# Patient Record
Sex: Male | Born: 1970 | Race: Black or African American | Hispanic: No | Marital: Single | Smoking: Former smoker
Health system: Southern US, Community
[De-identification: ages and names within clinical notes are randomized; demographics above are authoritative.]

## PROBLEM LIST (undated history)

## (undated) DIAGNOSIS — N261 Atrophy of kidney (terminal): Secondary | ICD-10-CM

## (undated) DIAGNOSIS — Z9689 Presence of other specified functional implants: Secondary | ICD-10-CM

## (undated) DIAGNOSIS — K859 Acute pancreatitis without necrosis or infection, unspecified: Secondary | ICD-10-CM

## (undated) DIAGNOSIS — Z87442 Personal history of urinary calculi: Secondary | ICD-10-CM

## (undated) DIAGNOSIS — E119 Type 2 diabetes mellitus without complications: Secondary | ICD-10-CM

## (undated) DIAGNOSIS — M545 Low back pain, unspecified: Secondary | ICD-10-CM

## (undated) DIAGNOSIS — K861 Other chronic pancreatitis: Secondary | ICD-10-CM

## (undated) DIAGNOSIS — E7800 Pure hypercholesterolemia, unspecified: Secondary | ICD-10-CM

## (undated) DIAGNOSIS — G43909 Migraine, unspecified, not intractable, without status migrainosus: Secondary | ICD-10-CM

## (undated) DIAGNOSIS — R519 Headache, unspecified: Secondary | ICD-10-CM

## (undated) DIAGNOSIS — I10 Essential (primary) hypertension: Secondary | ICD-10-CM

## (undated) DIAGNOSIS — K219 Gastro-esophageal reflux disease without esophagitis: Secondary | ICD-10-CM

## (undated) DIAGNOSIS — J189 Pneumonia, unspecified organism: Secondary | ICD-10-CM

## (undated) DIAGNOSIS — N200 Calculus of kidney: Secondary | ICD-10-CM

## (undated) DIAGNOSIS — N289 Disorder of kidney and ureter, unspecified: Secondary | ICD-10-CM

## (undated) DIAGNOSIS — R51 Headache: Secondary | ICD-10-CM

## (undated) DIAGNOSIS — G8929 Other chronic pain: Secondary | ICD-10-CM

## (undated) DIAGNOSIS — Q600 Renal agenesis, unilateral: Secondary | ICD-10-CM

## (undated) DIAGNOSIS — J42 Unspecified chronic bronchitis: Secondary | ICD-10-CM

## (undated) HISTORY — DX: Type 2 diabetes mellitus without complications: E11.9

## (undated) HISTORY — DX: Acute pancreatitis without necrosis or infection, unspecified: K85.90

## (undated) HISTORY — PX: NO PAST SURGERIES: SHX2092

---

## 1998-05-12 ENCOUNTER — Emergency Department (HOSPITAL_COMMUNITY): Admission: EM | Admit: 1998-05-12 | Discharge: 1998-05-12 | Payer: Self-pay | Admitting: Emergency Medicine

## 1999-03-14 ENCOUNTER — Encounter: Payer: Self-pay | Admitting: Emergency Medicine

## 1999-03-14 ENCOUNTER — Emergency Department (HOSPITAL_COMMUNITY): Admission: EM | Admit: 1999-03-14 | Discharge: 1999-03-14 | Payer: Self-pay | Admitting: Emergency Medicine

## 1999-06-20 ENCOUNTER — Emergency Department (HOSPITAL_COMMUNITY): Admission: EM | Admit: 1999-06-20 | Discharge: 1999-06-20 | Payer: Self-pay | Admitting: Emergency Medicine

## 1999-10-09 ENCOUNTER — Emergency Department (HOSPITAL_COMMUNITY): Admission: EM | Admit: 1999-10-09 | Discharge: 1999-10-09 | Payer: Self-pay | Admitting: Emergency Medicine

## 2000-02-07 ENCOUNTER — Encounter: Payer: Self-pay | Admitting: Emergency Medicine

## 2000-02-07 ENCOUNTER — Emergency Department (HOSPITAL_COMMUNITY): Admission: EM | Admit: 2000-02-07 | Discharge: 2000-02-07 | Payer: Self-pay | Admitting: *Deleted

## 2001-05-11 ENCOUNTER — Encounter: Payer: Self-pay | Admitting: Emergency Medicine

## 2001-05-11 ENCOUNTER — Emergency Department (HOSPITAL_COMMUNITY): Admission: EM | Admit: 2001-05-11 | Discharge: 2001-05-11 | Payer: Self-pay | Admitting: Emergency Medicine

## 2001-11-11 ENCOUNTER — Encounter: Payer: Self-pay | Admitting: Emergency Medicine

## 2001-11-11 ENCOUNTER — Emergency Department (HOSPITAL_COMMUNITY): Admission: EM | Admit: 2001-11-11 | Discharge: 2001-11-11 | Payer: Self-pay | Admitting: Emergency Medicine

## 2002-04-02 ENCOUNTER — Emergency Department (HOSPITAL_COMMUNITY): Admission: EM | Admit: 2002-04-02 | Discharge: 2002-04-02 | Payer: Self-pay | Admitting: Emergency Medicine

## 2002-04-02 ENCOUNTER — Encounter: Payer: Self-pay | Admitting: Emergency Medicine

## 2002-12-23 ENCOUNTER — Emergency Department (HOSPITAL_COMMUNITY): Admission: EM | Admit: 2002-12-23 | Discharge: 2002-12-23 | Payer: Self-pay | Admitting: Emergency Medicine

## 2003-08-14 ENCOUNTER — Emergency Department (HOSPITAL_COMMUNITY): Admission: EM | Admit: 2003-08-14 | Discharge: 2003-08-15 | Payer: Self-pay | Admitting: Family Medicine

## 2004-10-18 ENCOUNTER — Emergency Department (HOSPITAL_COMMUNITY): Admission: EM | Admit: 2004-10-18 | Discharge: 2004-10-18 | Payer: Self-pay | Admitting: Emergency Medicine

## 2004-12-30 ENCOUNTER — Emergency Department (HOSPITAL_COMMUNITY): Admission: EM | Admit: 2004-12-30 | Discharge: 2004-12-31 | Payer: Self-pay | Admitting: Emergency Medicine

## 2011-03-09 ENCOUNTER — Emergency Department (HOSPITAL_COMMUNITY)
Admission: EM | Admit: 2011-03-09 | Discharge: 2011-03-09 | Disposition: A | Discharge status: Home / Self Care | Payer: Self-pay | Attending: Emergency Medicine | Admitting: Emergency Medicine

## 2011-03-09 ENCOUNTER — Encounter: Payer: Self-pay | Admitting: *Deleted

## 2011-03-09 DIAGNOSIS — X58XXXA Exposure to other specified factors, initial encounter: Secondary | ICD-10-CM | POA: Insufficient documentation

## 2011-03-09 DIAGNOSIS — S161XXA Strain of muscle, fascia and tendon at neck level, initial encounter: Secondary | ICD-10-CM

## 2011-03-09 DIAGNOSIS — S139XXA Sprain of joints and ligaments of unspecified parts of neck, initial encounter: Secondary | ICD-10-CM | POA: Insufficient documentation

## 2011-03-09 DIAGNOSIS — M62838 Other muscle spasm: Secondary | ICD-10-CM | POA: Insufficient documentation

## 2011-03-09 MED ORDER — IBUPROFEN 800 MG PO TABS: 800.0000 mg | ORAL_TABLET | Freq: Three times a day (TID) | ORAL | Status: AC

## 2011-03-09 MED ORDER — ACETAMINOPHEN-CODEINE #3 300-30 MG PO TABS
1.0000 | ORAL_TABLET | Freq: Once | ORAL | Status: AC
  Administered 2011-03-09: 1 via ORAL
  Filled 2011-03-09: qty 1

## 2011-03-09 MED ORDER — DIAZEPAM 5 MG PO TABS
5.0000 mg | ORAL_TABLET | Freq: Once | ORAL | Status: AC
  Administered 2011-03-09: 5 mg via ORAL
  Filled 2011-03-09: qty 1

## 2011-03-09 MED ORDER — ACETAMINOPHEN-CODEINE #3 300-30 MG PO TABS: 1.0000 | ORAL_TABLET | Freq: Four times a day (QID) | ORAL | Status: AC | PRN

## 2011-03-09 MED ORDER — DIAZEPAM 5 MG PO TABS: ORAL_TABLET | ORAL | Status: AC

## 2011-03-09 MED ORDER — IBUPROFEN 800 MG PO TABS
800.0000 mg | ORAL_TABLET | Freq: Once | ORAL | Status: AC
  Administered 2011-03-09: 800 mg via ORAL
  Filled 2011-03-09: qty 1

## 2011-03-09 NOTE — ED Provider Notes (Signed)
History     CSN: [id number] Arrival date & time: 03/09/2011  1:41 PM   First MD Initiated Contact with Patient 03/09/11 1440      Chief Complaint  Patient presents with  . Neck Pain    "no feeling in LT hand/LFA"    (Consider location/radiation/quality/duration/timing/severity/associated sxs/prior treatment) HPI  Patient presents to emergency department complaining of waking 5 days ago with left-sided neck stiffness that he states has gradually increased over the last 5 days with pain aggravated by movement and that improves with keeping head still. Patient states he took Tylenol and ibuprofen over the last few days without relief of pain and therefore stopped taking those yesterday. Patient denies known injury to neck. Patient states that his left hand has had "pins and needles-like feeling" intermittently over the last 5 days but denies extremity weakness or numbness. Patient denies fevers, chills, headache, dizziness, rash. Patient has no known medical problems and takes no medicine on a basis. Symptoms were gradual onset, persistent, and worsening.  History reviewed. No pertinent past medical history.  History reviewed. No pertinent past surgical history.  History reviewed. No pertinent family history.  History  Substance Use Topics  . Smoking status: Never Smoker   . Smokeless tobacco: Not on file  . Alcohol Use:      40 oz a day of beer      Review of Systems  All other systems reviewed and are negative.    Allergies  Review of patient's allergies indicates no known allergies.  Home Medications  No current outpatient prescriptions on file.  BP 121/89  Pulse 92  Temp(Src) 98.9 F (37.2 C) (Oral)  Resp 20  Ht 6\' 1"  (1.854 m)  Wt 185 lb (83.915 kg)  BMI 24.41 kg/m2  SpO2 97%  Physical Exam  Nursing note and vitals reviewed. Constitutional: He is oriented to person, place, and time. He appears well-developed and well-nourished.  HENT:  Head:  Normocephalic and atraumatic.  Eyes: Conjunctivae are normal.  Neck: Neck supple. No tracheal deviation present.       Soft tissue tenderness of left lower lateral neck with muscle spasticity but no crepitus or skin changes.  Cardiovascular: Normal rate, regular rhythm and normal heart sounds.   Pulmonary/Chest: Effort normal.  Abdominal: Soft. Bowel sounds are normal. He exhibits no distension and no mass. There is no tenderness. There is no rebound and no guarding.  Genitourinary:       Penile discharge. No testicular tenderness to palpation. Testes descended bilaterally.  Musculoskeletal: Normal range of motion. He exhibits tenderness.       Full range of motion of bilateral upper extremities with 5 out of 5 strength and normal deep tendon reflexes. Tenderness to palpation of left upper shoulder and with muscle spasticity but no skin changes or crepitus.  Lymphadenopathy:    He has no cervical adenopathy.       Right: No inguinal adenopathy present.       Left: No inguinal adenopathy present.  Neurological: He is alert and oriented to person, place, and time. He has normal reflexes.  Skin: Skin is warm and dry. No rash noted.  Psychiatric: He has a normal mood and affect.    ED Course  Procedures (including critical care time)  Labs Reviewed - No data to display No results found.   1. Cervical strain   2. Muscle spasm       MDM  Muscle spasticity of left lower lateral neck and shoulder without signs  or symptoms of central cord compression or cauda equina. No meningeal signs. Afebrile nontoxic-appearing.        Jenness Corner, [place] 03/09/11 1454

## 2011-03-09 NOTE — ED Notes (Signed)
States he woke up Monday with neck pain which continued to 10-Sep-2022 when his "hand went dead".  Denies pins/needles/tingling.  Denies injury.

## 2011-03-09 NOTE — ED Notes (Signed)
Pt states he started to have left side neck pain. Pt states pain is now in his shoulders. Pt denies headache, chest pain, or blurred vision. Pt is a&ox3

## 2011-03-09 NOTE — ED Provider Notes (Signed)
Evaluation and management procedures were performed by the mid-level provider (PA/NP/CNM) under my supervision/collaboration. I was present and available during the ED course. Kyle Eastmond Y.   [name]. Shanzay Hepworth, MD 03/09/11 [phone]

## 2013-03-30 ENCOUNTER — Encounter (HOSPITAL_COMMUNITY): Payer: Self-pay | Admitting: Emergency Medicine

## 2013-03-30 ENCOUNTER — Emergency Department (HOSPITAL_COMMUNITY)
Admission: EM | Admit: 2013-03-30 | Discharge: 2013-03-30 | Disposition: A | Discharge status: Home / Self Care | Payer: Self-pay | Attending: Emergency Medicine | Admitting: Emergency Medicine

## 2013-03-30 DIAGNOSIS — N200 Calculus of kidney: Secondary | ICD-10-CM | POA: Insufficient documentation

## 2013-03-30 LAB — URINALYSIS, ROUTINE W REFLEX MICROSCOPIC
Bilirubin Urine: NEGATIVE
Glucose, UA: NEGATIVE mg/dL
Ketones, ur: NEGATIVE mg/dL
Nitrite: NEGATIVE
Protein, ur: NEGATIVE mg/dL
pH: 5 (ref 5.0–8.0)

## 2013-03-30 LAB — URINE MICROSCOPIC-ADD ON

## 2013-03-30 NOTE — ED Provider Notes (Signed)
Medical screening examination/treatment/procedure(s) were performed by non-physician practitioner and as supervising physician I was immediately available for consultation/collaboration.  EKG Interpretation   None         William Gabriele Zwilling, MD 03/30/13 2307 

## 2013-03-30 NOTE — ED Notes (Signed)
Pt states thinks he may have passed kidney stones Sunday morning; c/o left flank pain with actual passing of possible stones; states couldn't urinate then passed stones and pain stopped; states no symptoms now just wants to be checked

## 2013-03-30 NOTE — ED Provider Notes (Signed)
CSN: [id number]     Arrival date & time 03/30/13  1256 History   First MD Initiated Contact with Patient 03/30/13 1550     Chief Complaint  Patient presents with  . Flank Pain   (Consider location/radiation/quality/duration/timing/severity/associated sxs/prior Treatment) HPI Comments: Patient is a 42 year old healthy male who presents to the emergency department with concerns of possibly passing a kidney stone. Patient states around 5:00 this morning he woke up to go to the bathroom, developed a severe pain in the left side of his back radiating around to his flank, went to urinate, when he started he developed severe pain in his urine stream stopped. He was urinating into the sink and noticed small hard stones in the sink after he was done. Symptoms began to resolve shortly after and have not returned. At the time he was sweating. No nausea or vomiting, fever or chills. He has urinated since and it was normal denies dysuria, increased urinary, urgency or hematuria. No history of kidney stones.  Patient is a 42 y.o. male presenting with flank pain. The history is provided by the patient.  Flank Pain    History reviewed. No pertinent past medical history. History reviewed. No pertinent past surgical history. No family history on file. History  Substance Use Topics  . Smoking status: Never Smoker   . Smokeless tobacco: Not on file  . Alcohol Use:      Comment: 40 oz a day of beer    Review of Systems  Genitourinary: Positive for flank pain.  All other systems reviewed and are negative.    Allergies  Review of patient's allergies indicates no known allergies.  Home Medications  No current outpatient prescriptions on file. BP 151/95  Pulse 78  Temp(Src) 98.4 F (36.9 C) (Oral)  Resp 19  SpO2 95% Physical Exam  Nursing note and vitals reviewed. Constitutional: He is oriented to person, place, and time. He appears well-developed and well-nourished. No distress.  HENT:  Head:  Normocephalic and atraumatic.  Mouth/Throat: Oropharynx is clear and moist.  Eyes: Conjunctivae are normal.  Neck: Normal range of motion. Neck supple.  Cardiovascular: Normal rate, regular rhythm and normal heart sounds.   Pulmonary/Chest: Effort normal and breath sounds normal.  Abdominal: Soft. Normal appearance and bowel sounds are normal. He exhibits no distension. There is no tenderness. There is no rigidity, no rebound, no guarding and no CVA tenderness.  Musculoskeletal: Normal range of motion. He exhibits no edema.  Neurological: He is alert and oriented to person, place, and time.  Skin: Skin is warm and dry. He is not diaphoretic.  Psychiatric: He has a normal mood and affect. His behavior is normal.    ED Course  Procedures (including critical care time) Labs Review Labs Reviewed  URINALYSIS, ROUTINE W REFLEX MICROSCOPIC - Abnormal; Notable for the following:    Hgb urine dipstick TRACE (*)    All other components within normal limits  URINE MICROSCOPIC-ADD ON - Abnormal; Notable for the following:    Bacteria, UA FEW (*)    All other components within normal limits   Imaging Review No results found.  EKG Interpretation   None       MDM   1. Kidney stones    Pt presenting with symptoms of kidney stones, passed 5-6 small stones earlier this morning, brought them in a paper towel, largest ~3 mm. He is currently asymptomatic. UA obtained in triage prior to pt being seen, trace hemoglobin, otherwise no associated infection. Urine strainer  given in event this happens again, f/u with urology. Return precautions given. Patient states understanding of treatment care plan and is agreeable.     Trevor Mace, PA-C 03/30/13 [phone]

## 2013-04-18 ENCOUNTER — Encounter (HOSPITAL_COMMUNITY): Payer: Self-pay | Admitting: Emergency Medicine

## 2013-04-18 ENCOUNTER — Emergency Department (HOSPITAL_COMMUNITY)
Admission: EM | Admit: 2013-04-18 | Discharge: 2013-04-19 | Disposition: A | Discharge status: Home / Self Care | Payer: Self-pay | Attending: Emergency Medicine | Admitting: Emergency Medicine

## 2013-04-18 DIAGNOSIS — R509 Fever, unspecified: Secondary | ICD-10-CM | POA: Insufficient documentation

## 2013-04-18 DIAGNOSIS — B349 Viral infection, unspecified: Secondary | ICD-10-CM

## 2013-04-18 DIAGNOSIS — Z79899 Other long term (current) drug therapy: Secondary | ICD-10-CM | POA: Insufficient documentation

## 2013-04-18 DIAGNOSIS — B9789 Other viral agents as the cause of diseases classified elsewhere: Secondary | ICD-10-CM | POA: Insufficient documentation

## 2013-04-18 DIAGNOSIS — IMO0001 Reserved for inherently not codable concepts without codable children: Secondary | ICD-10-CM | POA: Insufficient documentation

## 2013-04-18 DIAGNOSIS — Z87442 Personal history of urinary calculi: Secondary | ICD-10-CM | POA: Insufficient documentation

## 2013-04-18 DIAGNOSIS — R319 Hematuria, unspecified: Secondary | ICD-10-CM

## 2013-04-18 DIAGNOSIS — R51 Headache: Secondary | ICD-10-CM | POA: Insufficient documentation

## 2013-04-18 HISTORY — DX: Disorder of kidney and ureter, unspecified: N28.9

## 2013-04-18 LAB — CBC WITH DIFFERENTIAL/PLATELET
BASOS ABS: 0 10*3/uL (ref 0.0–0.1)
BASOS PCT: 0 % (ref 0–1)
EOS ABS: 0.1 10*3/uL (ref 0.0–0.7)
EOS PCT: 1 % (ref 0–5)
HEMATOCRIT: 42.9 % (ref 39.0–52.0)
HEMOGLOBIN: 14.8 g/dL (ref 13.0–17.0)
Lymphocytes Relative: 32 % (ref 12–46)
Lymphs Abs: 1.6 10*3/uL (ref 0.7–4.0)
MCH: 32 pg (ref 26.0–34.0)
MCHC: 34.5 g/dL (ref 30.0–36.0)
MCV: 92.9 fL (ref 78.0–100.0)
MONO ABS: 0.4 10*3/uL (ref 0.1–1.0)
MONOS PCT: 7 % (ref 3–12)
Neutro Abs: 3 10*3/uL (ref 1.7–7.7)
Neutrophils Relative %: 59 % (ref 43–77)
Platelets: 166 10*3/uL (ref 150–400)
RBC: 4.62 MIL/uL (ref 4.22–5.81)
RDW: 13.5 % (ref 11.5–15.5)
WBC: 5 10*3/uL (ref 4.0–10.5)

## 2013-04-18 LAB — URINALYSIS, ROUTINE W REFLEX MICROSCOPIC
BILIRUBIN URINE: NEGATIVE
GLUCOSE, UA: NEGATIVE mg/dL
HGB URINE DIPSTICK: NEGATIVE
KETONES UR: NEGATIVE mg/dL
LEUKOCYTES UA: NEGATIVE
Nitrite: NEGATIVE
PH: 6 (ref 5.0–8.0)
PROTEIN: NEGATIVE mg/dL
Specific Gravity, Urine: 1.002 — ABNORMAL LOW (ref 1.005–1.030)
Urobilinogen, UA: 0.2 mg/dL (ref 0.0–1.0)

## 2013-04-18 NOTE — ED Notes (Signed)
Pt states he saw a small trace of blood after urinating. He states he passed a kidney stones a few days ago and is in the process of setting a f/u appt. With urology.

## 2013-04-18 NOTE — ED Notes (Signed)
Patient reports that he is having fever and Ha last night. Today he is having hematuria. Has a recent history of kidney stones

## 2013-04-19 LAB — BASIC METABOLIC PANEL
BUN: 9 mg/dL (ref 6–23)
CALCIUM: 9.8 mg/dL (ref 8.4–10.5)
CHLORIDE: 99 meq/L (ref 96–112)
CO2: 27 mEq/L (ref 19–32)
CREATININE: 1.23 mg/dL (ref 0.50–1.35)
GFR calc non Af Amer: 70 mL/min — ABNORMAL LOW (ref >90)
GFR, EST AFRICAN AMERICAN: 82 mL/min — AB (ref >90)
Glucose, Bld: 101 mg/dL — ABNORMAL HIGH (ref 70–99)
Potassium: 4.6 mEq/L (ref 3.7–5.3)
Sodium: 139 mEq/L (ref 137–147)

## 2013-04-19 NOTE — ED Provider Notes (Signed)
CSN: [id number]     Arrival date & time 04/18/13  1945 History   First MD Initiated Contact with Patient 04/18/13 2300     Chief Complaint  Patient presents with  . Hematuria   (Consider location/radiation/quality/duration/timing/severity/associated sxs/prior Treatment) HPI 43 year old male presents emergency apartment with complaint of one day of fever, headache, bodyaches.  Patient took Advil PM, and Robitussin last night.  Patient reports diagnosed with kidney stone  2 weeks ago.  This morning, he noticed blood in his urine.  He denies any pain associated with hematuria.  He reports his stepson has similar illness with fever and body aches.  Today he is feeling overall better.  He was concerned about the blood in his urine and wanted to get checked out.  Past Medical History  Diagnosis Date  . Renal disorder    History reviewed. No pertinent past surgical history. History reviewed. No pertinent family history. History  Substance Use Topics  . Smoking status: Never Smoker   . Smokeless tobacco: Not on file  . Alcohol Use: Yes     Comment: 40 oz a day of beer    Review of Systems  See History of Present Illness; otherwise all other systems are reviewed and negative Allergies  Review of patient's allergies indicates no known allergies.  Home Medications   Current Outpatient Rx  Name  Route  Sig  Dispense  Refill  . Ibuprofen-Diphenhydramine HCl (ADVIL PM) 200-25 MG CAPS   Oral   Take 1 tablet by mouth at bedtime as needed (sleep).         Marland Kitchen omeprazole (PRILOSEC) 20 MG capsule   Oral   Take 20 mg by mouth daily as needed (heart burn).         . Pseudoeph-CPM-DM-APAP (ROBITUSSIN FLU PO)   Oral   Take 1 capsule by mouth every 4 (four) hours as needed (flu-like symtpoms).          BP 145/87  Pulse 106  Temp(Src) 99.3 F (37.4 C) (Oral)  Resp 18  Wt 189 lb (85.73 kg)  SpO2 100% Physical Exam  Nursing note and vitals reviewed. Constitutional: He is oriented to  person, place, and time. He appears well-developed and well-nourished. No distress.  HENT:  Head: Normocephalic and atraumatic.  Right Ear: External ear normal.  Left Ear: External ear normal.  Nose: Nose normal.  Mouth/Throat: Oropharynx is clear and moist.  Eyes: Conjunctivae and EOM are normal. Pupils are equal, round, and reactive to light.  Neck: Normal range of motion. Neck supple. No JVD present. No tracheal deviation present. No thyromegaly present.  Cardiovascular: Normal rate, regular rhythm, normal heart sounds and intact distal pulses.  Exam reveals no gallop and no friction rub.   No murmur heard. Pulmonary/Chest: Effort normal and breath sounds normal. No stridor. No respiratory distress. He has no wheezes. He has no rales. He exhibits no tenderness.  Abdominal: Soft. Bowel sounds are normal. He exhibits no distension and no mass. There is no tenderness. There is no rebound and no guarding.  Musculoskeletal: Normal range of motion. He exhibits no edema and no tenderness.  Lymphadenopathy:    He has no cervical adenopathy.  Neurological: He is alert and oriented to person, place, and time. He has normal reflexes. He exhibits normal muscle tone. Coordination normal.  Skin: Skin is warm and dry. No rash noted. No erythema. No pallor.  Psychiatric: He has a normal mood and affect. His behavior is normal. Judgment and thought content normal.  ED Course  Procedures (including critical care time) Labs Review Labs Reviewed  URINALYSIS, ROUTINE W REFLEX MICROSCOPIC - Abnormal; Notable for the following:    Specific Gravity, Urine 1.002 (*)    All other components within normal limits  BASIC METABOLIC PANEL - Abnormal; Notable for the following:    Glucose, Bld 101 (*)    GFR calc non Af Amer 70 (*)    GFR calc Af Amer 82 (*)    All other components within normal limits  CBC WITH DIFFERENTIAL   Imaging Review No results found.  EKG Interpretation   None       MDM    1. Viral infection   2. Hematuria    43 year old male with hematuria earlier today.  Urine here today is normal.  Kidney function and CBC also normal.  Suspect is a viral illness, he may have some trace hematuria, left over from previous kidney stone.  He has followup scheduled with urology.  He is safe for discharge home   Kalman Drape, MD 04/19/13 [phone]

## 2013-04-19 NOTE — Discharge Instructions (Signed)
Alternate Tylenol and Motrin every 4-6 hours as needed for body aches, or fever.  Continue with Mucinex or Robitussin for cough and congestion.  Follow up with urology as indicated on your prior visit.  Return to the emergency room for worsening condition or new concerning symptoms.   Hematuria, Adult Hematuria (blood in your urine) can be caused by a bladder infection (cystitis), kidney infection (pyelonephritis), prostate infection (prostatitis), or kidney stone. Infections will usually respond to antibiotics (medications which kill germs), and a kidney stone will usually pass through your urine without further treatment. If you were put on antibiotics, take all the medicine until gone. You may feel better in a few days, but take all of your medicine or the infection may not respond and become more difficult to treat. If antibiotics were not given, an infection did not cause the blood in the urine. A further work up to find out the reason may be needed. HOME CARE INSTRUCTIONS   Drink lots of fluid, 3 to 4 quarts a day. If you have been diagnosed with an infection, cranberry juice is especially recommended, in addition to large amounts of water.  Avoid caffeine, tea, and carbonated beverages, because they tend to irritate the bladder.  Avoid alcohol as it may irritate the prostate.  Only take over-the-counter or prescription medicines for pain, discomfort, or fever as directed by your caregiver.  If you have been diagnosed with a kidney stone follow your caregivers instructions regarding straining your urine to catch the stone. TO PREVENT FURTHER INFECTIONS:  Empty the bladder often. Avoid holding urine for long periods of time.  After a bowel movement, women should cleanse front to back. Use each tissue only once.  Empty the bladder before and after sexual intercourse if you are a male.  Return to your caregiver if you develop back pain, fever, nausea (feeling sick to your stomach),  vomiting, or your symptoms (problems) are not better in 3 days. Return sooner if you are getting worse. If you have been requested to return for further testing make sure to keep your appointments. If an infection is not the cause of blood in your urine, X-rays may be required. Your caregiver will discuss this with you. SEEK IMMEDIATE MEDICAL CARE IF:   You have a persistent fever over 102 F (38.9 C).  You develop severe vomiting and are unable to keep the medication down.  You develop severe back or abdominal pain despite taking your medications.  You begin passing a large amount of blood or clots in your urine.  You feel extremely weak or faint, or pass out. MAKE SURE YOU:   Understand these instructions.  Will watch your condition.  Will get help right away if you are not doing well or get worse. Document Released: 04/02/2005 Document Revised: 06/25/2011 Document Reviewed: 11/20/2007 [facility] Patient Information 2014 [place].  Viral Infections A viral infection can be caused by different types of viruses.Most viral infections are not serious and resolve on their own. However, some infections may cause severe symptoms and may lead to further complications. SYMPTOMS Viruses can frequently cause:  Minor sore throat.  Aches and pains.  Headaches.  Runny nose.  Different types of rashes.  Watery eyes.  Tiredness.  Cough.  Loss of appetite.  Gastrointestinal infections, resulting in nausea, vomiting, and diarrhea. These symptoms do not respond to antibiotics because the infection is not caused by bacteria. However, you might catch a bacterial infection following the viral infection. This is sometimes called  a "superinfection." Symptoms of such a bacterial infection may include:  Worsening sore throat with pus and difficulty swallowing.  Swollen neck glands.  Chills and a high or persistent fever.  Severe headache.  Tenderness over the  sinuses.  Persistent overall ill feeling (malaise), muscle aches, and tiredness (fatigue).  Persistent cough.  Yellow, green, or brown mucus production with coughing. HOME CARE INSTRUCTIONS   Only take over-the-counter or prescription medicines for pain, discomfort, diarrhea, or fever as directed by your caregiver.  Drink enough water and fluids to keep your urine clear or pale yellow. Sports drinks can provide valuable electrolytes, sugars, and hydration.  Get plenty of rest and maintain proper nutrition. Soups and broths with crackers or rice are fine. SEEK IMMEDIATE MEDICAL CARE IF:   You have severe headaches, shortness of breath, chest pain, neck pain, or an unusual rash.  You have uncontrolled vomiting, diarrhea, or you are unable to keep down fluids.  You or your child has an oral temperature above 102 F (38.9 C), not controlled by medicine.  Your baby is older than 3 months with a rectal temperature of 102 F (38.9 C) or higher.  Your baby is 57 months old or younger with a rectal temperature of 100.4 F (38 C) or higher. MAKE SURE YOU:   Understand these instructions.  Will watch your condition.  Will get help right away if you are not doing well or get worse. Document Released: 01/10/2005 Document Revised: 06/25/2011 Document Reviewed: 08/07/2010 [facility] Patient Information 2014 [place], [place].

## 2013-11-16 ENCOUNTER — Emergency Department (INDEPENDENT_AMBULATORY_CARE_PROVIDER_SITE_OTHER): Payer: No Typology Code available for payment source

## 2013-11-16 ENCOUNTER — Emergency Department (HOSPITAL_COMMUNITY)
Admission: EM | Admit: 2013-11-16 | Discharge: 2013-11-16 | Disposition: A | Discharge status: Home / Self Care | Payer: No Typology Code available for payment source | Source: Home / Self Care | Attending: Emergency Medicine | Admitting: Emergency Medicine

## 2013-11-16 ENCOUNTER — Encounter (HOSPITAL_COMMUNITY): Payer: Self-pay | Admitting: Emergency Medicine

## 2013-11-16 DIAGNOSIS — M533 Sacrococcygeal disorders, not elsewhere classified: Secondary | ICD-10-CM

## 2013-11-16 MED ORDER — HYDROCODONE-ACETAMINOPHEN 5-325 MG PO TABS: ORAL_TABLET | ORAL | Status: DC

## 2013-11-16 MED ORDER — KETOROLAC TROMETHAMINE 60 MG/2ML IM SOLN
INTRAMUSCULAR | Status: AC
  Filled 2013-11-16: qty 2

## 2013-11-16 MED ORDER — PREDNISONE 20 MG PO TABS: ORAL_TABLET | ORAL | Status: DC

## 2013-11-16 MED ORDER — HYDROCODONE-ACETAMINOPHEN 5-325 MG PO TABS
2.0000 | ORAL_TABLET | Freq: Once | ORAL | Status: AC
  Administered 2013-11-16: 2 via ORAL

## 2013-11-16 MED ORDER — KETOROLAC TROMETHAMINE 60 MG/2ML IM SOLN
60.0000 mg | Freq: Once | INTRAMUSCULAR | Status: AC
  Administered 2013-11-16: 60 mg via INTRAMUSCULAR

## 2013-11-16 MED ORDER — HYDROCODONE-ACETAMINOPHEN 5-325 MG PO TABS
ORAL_TABLET | ORAL | Status: AC
  Filled 2013-11-16: qty 2

## 2013-11-16 MED ORDER — DICLOFENAC SODIUM 75 MG PO TBEC: 75.0000 mg | DELAYED_RELEASE_TABLET | Freq: Two times a day (BID) | ORAL | Status: DC
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
## 2013-11-16 NOTE — Discharge Instructions (Signed)
Do exercises twice daily followed by moist heat for 15 minutes. ° ° ° ° ° °Try to be as active as possible. ° °If no better in 2 weeks, follow up with orthopedist. ° ° °

## 2013-11-16 NOTE — ED Provider Notes (Signed)
Chief Complaint   Chief Complaint  Patient presents with  . Back Pain    History of Present Illness   Kyle Berry is a 43 year old male who has had a several year history of recurring lower back pain. This occurred after he fell on the job. The pain flared up about 3 or 4 days ago without any obvious precipitating factor. The pain is rated 10 over 10 at its worst and now is an 8/10. It's worse if he bends or twists. Sometimes it feels like his back gives way. It is sometimes sharp and sometimes dull. It's better when he lies flat or rests, and also when he walks. The pain radiates into his bilateral proximal thighs and he has some paresthesias in that area and his legs feel weak. He denies any bladder or bowel dysfunction or saddle anesthesia. He's had no bowel pain, fever, chills, or weight loss.  Review of Systems   Other than as noted above, the patient denies any of the following symptoms: Systemic:  No fever, chills, or unexplained weight loss. GI:  No abdominal painor incontinence of bowel. GU:  No dysuria, frequency, urgency, or hematuria. No incontinence of urine or urinary retention.  M-S:  No neck pain or arthritis. Neuro:  No paresthesias, headache, saddle anesthesia, muscular weakness, or progressive neurological deficit.  [place]   Past medical history, family history, social history, meds, and allergies were reviewed. Specifically, there is no history of cancer, major trauma, osteoporosis, immunosuppression, or HIV infection. He has a history of kidney stones.  Physical Examination    Vital signs:  BP 141/96  Pulse 82  Temp(Src) 98.9 F (37.2 C) (Oral)  Resp 16  SpO2 95% General:  Alert, oriented, in no distress. Abdomen:  Soft, non-tender.  No organomegaly or mass.  No pulsatile midline abdominal mass or bruit. Back:  He has is to palpation over the paraspinous muscles bilaterally and also over the sacroiliac joints but none at the midline. The back has a full  range of motion but with pain on movement. Straight leg raising is negative. Neuro:  Normal muscle strength, sensations and DTRs. Extremities: Pedal pulses were full, there was no edema. Skin:  Clear, warm and dry.  No rash.   Radiology   Dg Lumbar Spine Complete  11/16/2013   CLINICAL DATA:  Chronic lower back pain has become worse over the last couple weeks. No injury.  EXAM: LUMBAR SPINE - COMPLETE 4+ VIEW  COMPARISON:  None.  FINDINGS: Straightening of the lumbar spine with minimal curvature convex the left.  No significant disc space narrowing.  No pars defect.  Mild right-sided sacroiliac joint degenerative changes.  IMPRESSION: Straightening of the lumbar spine with minimal curvature convex the left.  No significant disc space narrowing.  No pars defect.  Mild right-sided sacroiliac joint degenerative changes.   Electronically Signed   By: Chauncey Cruel M.D.   On: 11/16/2013 13:21    Course in Urgent [place]   He was given Toradol 60 mg IM and Norco 5/325 2 by mouth for pain with good relief of his pain.    Assessment   The encounter diagnosis was Sacro ilial pain.  No evidence of cauda equina syndrome, discitis, epidural abscess, or aneurism.    Plan     1.  Meds:  The following meds were prescribed:   Discharge Medication List as of 11/16/2013  2:26 PM    START taking these medications   Details  diclofenac (VOLTAREN) 75  MG EC tablet Take 1 tablet (75 mg total) by mouth 2 (two) times daily., Starting 11/16/2013, Until Discontinued, Normal    HYDROcodone-acetaminophen (NORCO/VICODIN) 5-325 MG per tablet 1 to 2 tabs every 4 to 6 hours as needed for pain., Print    predniSONE (DELTASONE) 20 MG tablet Take 3 daily for 5 days, 2 daily for 5 days, 1 daily for 5 days., Normal        2.  Patient Education/Counseling:  The patient was given appropriate handouts, self care instructions, and instructed in symptomatic relief. The patient was encouraged to try to be as active as  possible and given some exercises to do followed by moist heat.  3.  Follow up:  The patient was told to follow up here if no better in 3 to 4 days, or sooner if becoming worse in any way, and given some red flag symptoms such as worsening pain or new neurological symptoms which would prompt immediate return.  Follow up with Dr. Fredonia Highland in 2 weeks.     Harden Mo, MD 11/16/13 [phone]

## 2013-11-18 ENCOUNTER — Emergency Department (INDEPENDENT_AMBULATORY_CARE_PROVIDER_SITE_OTHER)
Admission: EM | Admit: 2013-11-18 | Discharge: 2013-11-18 | Disposition: A | Discharge status: Home / Self Care | Payer: No Typology Code available for payment source | Source: Home / Self Care | Attending: Family Medicine | Admitting: Family Medicine

## 2013-11-18 ENCOUNTER — Encounter (HOSPITAL_COMMUNITY): Payer: Self-pay | Admitting: Emergency Medicine

## 2013-11-18 DIAGNOSIS — M545 Low back pain, unspecified: Secondary | ICD-10-CM

## 2013-11-18 MED ORDER — TRAMADOL HCL 50 MG PO TABS: 50.0000 mg | ORAL_TABLET | Freq: Three times a day (TID) | ORAL | Status: DC | PRN

## 2013-11-18 NOTE — ED Provider Notes (Signed)
CSN: [id number]     Arrival date & time 11/18/13  5053 History   None    Chief Complaint  Patient presents with  . Allergic Reaction   (Consider location/radiation/quality/duration/timing/severity/associated sxs/prior Treatment) HPI Comments: Patient reports he was evaluated at [facility] for chronic lower back pain on 11/16/2013 and prescribed prednisone taper and Diclofenac for pain. He feels he has experienced an allergic response to Diclofenac. States when he took his prescribed doses yesterday, he experienced generalized itching without visible rash and swelling of his lips with associated difficulty swallowing. States these symptoms have since resolved, but he has been hesitant to take any additional doses. States he has taken prednisone without difficulty. Also requests note for employer excusing him from work through 11/21/2013.   The history is provided by the patient.    Past Medical History  Diagnosis Date  . Renal disorder    History reviewed. No pertinent past surgical history. History reviewed. No pertinent family history. History  Substance Use Topics  . Smoking status: Never Smoker   . Smokeless tobacco: Not on file  . Alcohol Use: Yes     Comment: 40 oz a day of beer    Review of Systems  All other systems reviewed and are negative.   Allergies  Review of patient's allergies indicates no known allergies.  Home Medications   Prior to Admission medications   Medication Sig Start Date End Date Taking? Authorizing Provider  diclofenac (VOLTAREN) 75 MG EC tablet Take 1 tablet (75 mg total) by mouth 2 (two) times daily. 11/16/13   Harden Mo, MD  HYDROcodone-acetaminophen (NORCO/VICODIN) 5-325 MG per tablet 1 to 2 tabs every 4 to 6 hours as needed for pain. 11/16/13   Harden Mo, MD  Ibuprofen-Diphenhydramine HCl (ADVIL PM) 200-25 MG CAPS Take 1 tablet by mouth at bedtime as needed (sleep).    Historical Provider, MD  omeprazole (PRILOSEC) 20 MG capsule Take 20 mg by mouth  daily as needed (heart burn).    Historical Provider, MD  predniSONE (DELTASONE) 20 MG tablet Take 3 daily for 5 days, 2 daily for 5 days, 1 daily for 5 days. 11/16/13   Harden Mo, MD  Pseudoeph-CPM-DM-APAP (ROBITUSSIN FLU PO) Take 1 capsule by mouth every 4 (four) hours as needed (flu-like symtpoms).    Historical Provider, MD  traMADol (ULTRAM) 50 MG tablet Take 1 tablet (50 mg total) by mouth 3 (three) times daily as needed for moderate pain or severe pain. 11/18/13   Annett Gula H Sammantha Mehlhaff, PA   BP 134/81  Pulse 87  Temp(Src) 98.5 F (36.9 C) (Oral)  Resp 18  SpO2 96% Physical Exam  Nursing note and vitals reviewed. Constitutional: He is oriented to person, place, and time. He appears well-developed and well-nourished.  HENT:  Head: Normocephalic and atraumatic.  Eyes: Conjunctivae are normal. No scleral icterus.  Neck: Normal range of motion. Neck supple.  Cardiovascular: Normal rate, regular rhythm and normal heart sounds.   Pulmonary/Chest: Effort normal and breath sounds normal.  Abdominal: Soft. Bowel sounds are normal. He exhibits no distension. There is no tenderness.  Musculoskeletal:       Back:  Outlined area is area of reported chronic pain, however, little to no pain was reproducible during today's examination. Neurovascular and strength exam of bilateral lower extremities normal/intact.   Neurological: He is alert and oriented to person, place, and time. He has normal strength. No sensory deficit. Coordination and gait normal.  Reflex Scores:  Patellar reflexes are 2+ on the right side and 2+ on the left side. Skin: Skin is warm and dry. No rash noted. No erythema.  Psychiatric: He has a normal mood and affect. His behavior is normal.    ED Course  Procedures (including critical care time) Labs Review Labs Reviewed - No data to display  Imaging Review Dg Lumbar Spine Complete  11/16/2013   CLINICAL DATA:  Chronic lower back pain has become worse over the  last couple weeks. No injury.  EXAM: LUMBAR SPINE - COMPLETE 4+ VIEW  COMPARISON:  None.  FINDINGS: Straightening of the lumbar spine with minimal curvature convex the left.  No significant disc space narrowing.  No pars defect.  Mild right-sided sacroiliac joint degenerative changes.  IMPRESSION: Straightening of the lumbar spine with minimal curvature convex the left.  No significant disc space narrowing.  No pars defect.  Mild right-sided sacroiliac joint degenerative changes.   Electronically Signed   By: Chauncey Cruel M.D.   On: 11/16/2013 13:21     MDM   1. Bilateral low back pain without sciatica   Advised patient that he has already received a note excusing him from work through 11/19/2013 and based upon his current examination it did not appear medically necessary for him to be excused from work through 11/21/2013. He should return to work as previously advised. Advised to discontinue Diclofenac and may use Ultram as prescribed for pain. Ambulatory referral sent to [facility] for evaluation.     Lutricia Feil, [place] 11/18/13 1159

## 2013-11-18 NOTE — ED Notes (Signed)
Patient states his boss had told him he should not return to work until Saturday, and he would like a new note. After discussion w L Presson, PA, patient  was advised we have provided him a note returning him to work in AM, and as his boss does not want him to return until Saturday, then his boss will need to provide that documentation , since he does not have a new injury

## 2013-11-18 NOTE — Discharge Instructions (Signed)
Back Exercises Back exercises help treat and prevent back injuries. The goal of back exercises is to increase the strength of your abdominal and back muscles and the flexibility of your back. These exercises should be started when you no longer have back pain. Back exercises include:  Pelvic Tilt. Lie on your back with your knees bent. Tilt your pelvis until the lower part of your back is against the floor. Hold this position 5 to 10 sec and repeat 5 to 10 times.  Knee to Chest. Pull first 1 knee up against your chest and hold for 20 to 30 seconds, repeat this with the other knee, and then both knees. This may be done with the other leg straight or bent, whichever feels better.  Sit-Ups or Curl-Ups. Bend your knees 90 degrees. Start with tilting your pelvis, and do a partial, slow sit-up, lifting your trunk only 30 to 45 degrees off the floor. Take at least 2 to 3 seconds for each sit-up. Do not do sit-ups with your knees out straight. If partial sit-ups are difficult, simply do the above but with only tightening your abdominal muscles and holding it as directed.  Hip-Lift. Lie on your back with your knees flexed 90 degrees. Push down with your feet and shoulders as you raise your hips a couple inches off the floor; hold for 10 seconds, repeat 5 to 10 times.  Back arches. Lie on your stomach, propping yourself up on bent elbows. Slowly press on your hands, causing an arch in your low back. Repeat 3 to 5 times. Any initial stiffness and discomfort should lessen with repetition over time.  Shoulder-Lifts. Lie face down with arms beside your body. Keep hips and torso pressed to floor as you slowly lift your head and shoulders off the floor. Do not overdo your exercises, especially in the beginning. Exercises may cause you some mild back discomfort which lasts for a few minutes; however, if the pain is more severe, or lasts for more than 15 minutes, do not continue exercises until you see your caregiver.  Improvement with exercise therapy for back problems is slow.  See your caregivers for assistance with developing a proper back exercise program. Document Released: 05/10/2004 Document Revised: 06/25/2011 Document Reviewed: 02/01/2011 [facility] Patient Information 2015 [place], [place]. This information is not intended to replace advice given to you by your health care provider. Make sure you discuss any questions you have with your health care provider.  Back Injury Prevention Back injuries can be extremely painful and difficult to heal. After having one back injury, you are much more likely to experience another later on. It is important to learn how to avoid injuring or re-injuring your back. The following tips can help you to prevent a back injury. PHYSICAL FITNESS  Exercise regularly and try to develop good tone in your abdominal muscles. Your abdominal muscles provide a lot of the support needed by your back.  Do aerobic exercises (walking, jogging, biking, swimming) regularly.  Do exercises that increase balance and strength (tai chi, yoga) regularly. This can decrease your risk of falling and injuring your back.  Stretch before and after exercising.  Maintain a healthy weight. The more you weigh, the more stress is placed on your back. For every pound of weight, 10 times that amount of pressure is placed on the back. DIET  Talk to your caregiver about how much calcium and vitamin D you need per day. These nutrients help to prevent weakening of the bones (osteoporosis). Osteoporosis  can cause broken (fractured) bones that lead to back pain. °· Include good sources of calcium in your diet, such as dairy products, green, leafy vegetables, and products with calcium added (fortified). °· Include good sources of vitamin D in your diet, such as milk and foods that are fortified with vitamin D. °· Consider taking a nutritional supplement or a multivitamin if needed. °· Stop smoking if you  smoke. °POSTURE °· Sit and stand up straight. Avoid leaning forward when you sit or hunching over when you stand. °· Choose chairs with good low back (lumbar) support. °· If you work at a desk, sit close to your work so you do not need to lean over. Keep your chin tucked in. Keep your neck drawn back and elbows bent at a right angle. Your arms should look like the letter "L." °· Sit high and close to the steering wheel when you drive. Add a lumbar support to your car seat if needed. °· Avoid sitting or standing in one position for too long. Take breaks to get up, stretch, and walk around at least once every hour. Take breaks if you are driving for long periods of time. °· Sleep on your side with your knees slightly bent, or sleep on your back with a pillow under your knees. Do not sleep on your stomach. °LIFTING, TWISTING, AND REACHING °· Avoid heavy lifting, especially repetitive lifting. If you must do heavy lifting: °¨ Stretch before lifting. °¨ Work slowly. °¨ Rest between lifts. °¨ Use carts and dollies to move objects when possible. °¨ Make several small trips instead of carrying 1 heavy load. °¨ Ask for help when you need it. °¨ Ask for help when moving big, awkward objects. °· Follow these steps when lifting: °¨ Stand with your feet shoulder-width apart. °¨ Get as close to the object as you can. Do not try to pick up heavy objects that are far from your body. °¨ Use handles or lifting straps if they are available. °¨ Bend at your knees. Squat down, but keep your heels off the floor. °¨ Keep your shoulders pulled back, your chin tucked in, and your back straight. °¨ Lift the object slowly, tightening the muscles in your legs, abdomen, and buttocks. Keep the object as close to the center of your body as possible. °¨ When you put a load down, use these same guidelines in reverse. °· Do not: °¨ Lift the object above your waist. °¨ Twist at the waist while lifting or carrying a load. Move your feet if you need to  turn, not your waist. °¨ Bend over without bending at your knees. °· Avoid reaching over your head, across a table, or for an object on a high surface. °OTHER TIPS °· Avoid wet floors and keep sidewalks clear of ice to prevent falls. °· Do not sleep on a mattress that is too soft or too hard. °· Keep items that are used frequently within easy reach. °· Put heavier objects on shelves at waist level and lighter objects on lower or higher shelves. °· Find ways to decrease your stress, such as exercise, massage, or relaxation techniques. Stress can build up in your muscles. Tense muscles are more vulnerable to injury. °· Seek treatment for depression or anxiety if needed. These conditions can increase your risk of developing back pain. °SEEK MEDICAL CARE IF: °· You injure your back. °· You have questions about diet, exercise, or other ways to prevent back injuries. °MAKE SURE YOU: °· Understand these   instructions.  Will watch your condition.  Will get help right away if you are not doing well or get worse. Document Released: 05/10/2004 Document Revised: 06/25/2011 Document Reviewed: 05/14/2011 [facility] Patient Information 2015 [place], [place]. This information is not intended to replace advice given to you by your health care provider. Make sure you discuss any questions you have with your health care provider.  Back Pain, Adult Low back pain is very common. About 1 in 5 people have back pain.The cause of low back pain is rarely dangerous. The pain often gets better over time.About half of people with a sudden onset of back pain feel better in just 2 weeks. About 8 in 10 people feel better by 6 weeks.  CAUSES Some common causes of back pain include:  Strain of the muscles or ligaments supporting the spine.  Wear and tear (degeneration) of the spinal discs.  Arthritis.  Direct injury to the back. DIAGNOSIS Most of the time, the direct cause of low back pain is not known.However, back pain can be  treated effectively even when the exact cause of the pain is unknown.Answering your caregiver's questions about your overall health and symptoms is one of the most accurate ways to make sure the cause of your pain is not dangerous. If your caregiver needs more information, he or she may order lab work or imaging tests (X-rays or MRIs).However, even if imaging tests show changes in your back, this usually does not require surgery. HOME CARE INSTRUCTIONS For many people, back pain returns.Since low back pain is rarely dangerous, it is often a condition that people can learn to [facility] their own.   Remain active. It is stressful on the back to sit or stand in one place. Do not sit, drive, or stand in one place for more than 30 minutes at a time. Take short walks on level surfaces as soon as pain allows.Try to increase the length of time you walk each day.  Do not stay in bed.Resting more than 1 or 2 days can delay your recovery.  Do not avoid exercise or work.Your body is made to move.It is not dangerous to be active, even though your back may hurt.Your back will likely heal faster if you return to being active before your pain is gone.  Pay attention to your body when you bend and lift. Many people have less discomfortwhen lifting if they bend their knees, keep the load close to their bodies,and avoid twisting. Often, the most comfortable positions are those that put less stress on your recovering back.  Find a comfortable position to sleep. Use a firm mattress and lie on your side with your knees slightly bent. If you lie on your back, put a pillow under your knees.  Only take over-the-counter or prescription medicines as directed by your caregiver. Over-the-counter medicines to reduce pain and inflammation are often the most helpful.Your caregiver may prescribe muscle relaxant drugs.These medicines help dull your pain so you can more quickly return to your normal activities and healthy  exercise.  Put ice on the injured area.  Put ice in a plastic bag.  Place a towel between your skin and the bag.  Leave the ice on for 15-20 minutes, 03-04 times a day for the first 2 to 3 days. After that, ice and heat may be alternated to reduce pain and spasms.  Ask your caregiver about trying back exercises and gentle massage. This may be of some benefit.  Avoid feeling anxious or stressed.Stress increases  muscle tension and can worsen back pain.It is important to recognize when you are anxious or stressed and learn ways to manage it.Exercise is a great option. SEEK MEDICAL CARE IF:  You have pain that is not relieved with rest or medicine.  You have pain that does not improve in 1 week.  You have new symptoms.  You are generally not feeling well. SEEK IMMEDIATE MEDICAL CARE IF:   You have pain that radiates from your back into your legs.  You develop new bowel or bladder control problems.  You have unusual weakness or numbness in your arms or legs.  You develop nausea or vomiting.  You develop abdominal pain.  You feel faint. Document Released: 04/02/2005 Document Revised: 10/02/2011 Document Reviewed: 08/04/2013 [facility] Patient Information 2015 [place], [place]. This information is not intended to replace advice given to you by your health care provider. Make sure you discuss any questions you have with your health care provider.

## 2013-11-18 NOTE — ED Provider Notes (Signed)
Medical screening examination/treatment/procedure(s) were performed by a resident physician or non-physician practitioner and as the supervising physician I was immediately available for consultation/collaboration.  Linna Darner, MD Family Medicine   Waldemar Dickens, MD 11/18/13 [phone]

## 2013-11-18 NOTE — ED Notes (Signed)
States he started Rx Monday PM, and on Tuesday , he was walking better, but had generalized itching. C/o trouble swallowing and lips swelling Tuesday PM, Wednesday AM; NAD at present

## 2013-12-14 ENCOUNTER — Encounter (HOSPITAL_COMMUNITY): Payer: Self-pay | Admitting: Emergency Medicine

## 2013-12-14 ENCOUNTER — Emergency Department (HOSPITAL_COMMUNITY)
Admission: EM | Admit: 2013-12-14 | Discharge: 2013-12-14 | Disposition: A | Discharge status: Home / Self Care | Payer: Self-pay | Attending: Emergency Medicine | Admitting: Emergency Medicine

## 2013-12-14 ENCOUNTER — Emergency Department (HOSPITAL_COMMUNITY): Payer: No Typology Code available for payment source

## 2013-12-14 DIAGNOSIS — Z87442 Personal history of urinary calculi: Secondary | ICD-10-CM | POA: Insufficient documentation

## 2013-12-14 DIAGNOSIS — R319 Hematuria, unspecified: Secondary | ICD-10-CM | POA: Insufficient documentation

## 2013-12-14 LAB — URINALYSIS, ROUTINE W REFLEX MICROSCOPIC
BILIRUBIN URINE: NEGATIVE
Glucose, UA: NEGATIVE mg/dL
Hgb urine dipstick: NEGATIVE
KETONES UR: NEGATIVE mg/dL
LEUKOCYTES UA: NEGATIVE
NITRITE: NEGATIVE
PH: 5.5 (ref 5.0–8.0)
PROTEIN: NEGATIVE mg/dL
Specific Gravity, Urine: 1.017 (ref 1.005–1.030)
Urobilinogen, UA: 0.2 mg/dL (ref 0.0–1.0)

## 2013-12-14 MED ORDER — ONDANSETRON 4 MG PO TBDP
8.0000 mg | ORAL_TABLET | Freq: Once | ORAL | Status: AC
  Administered 2013-12-14: 8 mg via ORAL
  Filled 2013-12-14: qty 2

## 2013-12-14 MED ORDER — ONDANSETRON 4 MG PO TBDP: 4.0000 mg | ORAL_TABLET | Freq: Three times a day (TID) | ORAL | Status: DC | PRN

## 2013-12-14 MED ORDER — TRAMADOL HCL 50 MG PO TABS: 50.0000 mg | ORAL_TABLET | Freq: Four times a day (QID) | ORAL | Status: DC | PRN

## 2013-12-14 MED ORDER — OXYCODONE-ACETAMINOPHEN 5-325 MG PO TABS
1.0000 | ORAL_TABLET | Freq: Once | ORAL | Status: AC
  Administered 2013-12-14: 1 via ORAL
  Filled 2013-12-14: qty 1

## 2013-12-14 NOTE — ED Provider Notes (Signed)
CSN: [id number]     Arrival date & time 12/14/13  1629 History   First MD Initiated Contact with Patient 12/14/13 1821     Chief Complaint  Patient presents with  . Hematuria     (Consider location/radiation/quality/duration/timing/severity/associated sxs/prior Treatment) HPI Comments: Patient is a 43 yo M PMHx significant for hx kidney stones presenting to the ED for one day history of left flank pain with associated hematuria. Patient states he had a kidney stone seven months ago that had similar symptoms. No alleviating or aggravating factors. Denies any fevers, chills, nausea, vomiting, diarrhea, constipation. No abdominal surgical history. Denies any recent unprotected sexual intercourse. No concern for STDs.   Patient is a 43 y.o. male presenting with hematuria.  Hematuria Pertinent negatives include no chills, fever, nausea or vomiting.    Past Medical History  Diagnosis Date  . Renal disorder    History reviewed. No pertinent past surgical history. History reviewed. No pertinent family history. History  Substance Use Topics  . Smoking status: Never Smoker   . Smokeless tobacco: Not on file  . Alcohol Use: Yes     Comment: 40 oz a day of beer    Review of Systems  Constitutional: Negative for fever and chills.  Gastrointestinal: Negative for nausea and vomiting.  Genitourinary: Positive for hematuria and flank pain. Negative for dysuria, urgency, discharge, penile swelling, scrotal swelling, penile pain and testicular pain.  All other systems reviewed and are negative.     Allergies  Diclofenac and Prednisone  Home Medications   Prior to Admission medications   Medication Sig Start Date End Date Taking? Authorizing Provider  Ibuprofen-Diphenhydramine HCl (ADVIL PM) 200-25 MG CAPS Take 1 tablet by mouth at bedtime as needed (sleep).   Yes Historical Provider, MD  omeprazole (PRILOSEC) 20 MG capsule Take 20 mg by mouth daily as needed (heart burn).   Yes  Historical Provider, MD  ondansetron (ZOFRAN ODT) 4 MG disintegrating tablet Take 1 tablet (4 mg total) by mouth every 8 (eight) hours as needed for nausea or vomiting. 12/14/13   Stephani Police Tracee Mccreery, PA-C  traMADol (ULTRAM) 50 MG tablet Take 1 tablet (50 mg total) by mouth every 6 (six) hours as needed. 12/14/13   Aquiles Ruffini L Addylin Manke, PA-C   BP 132/94  Pulse 82  Temp(Src) 99 F (37.2 C) (Oral)  Resp 16  SpO2 100% Physical Exam  Nursing note and vitals reviewed. Constitutional: He is oriented to person, place, and time. He appears well-developed and well-nourished. No distress.  HENT:  Head: Normocephalic and atraumatic.  Right Ear: External ear normal.  Left Ear: External ear normal.  Nose: Nose normal.  Mouth/Throat: Oropharynx is clear and moist.  Eyes: Conjunctivae are normal.  Neck: Normal range of motion. Neck supple.  Cardiovascular: Normal rate, regular rhythm and normal heart sounds.   Pulmonary/Chest: Effort normal and breath sounds normal. No respiratory distress.  Abdominal: Soft. Normal appearance and bowel sounds are normal. There is no tenderness. There is no rigidity, no rebound, no guarding and no CVA tenderness.  Musculoskeletal: Normal range of motion.       Back:  Neurological: He is alert and oriented to person, place, and time.  Skin: Skin is warm and dry. He is not diaphoretic.  Psychiatric: He has a normal mood and affect.    ED Course  Procedures (including critical care time) Medications  oxyCODONE-acetaminophen (PERCOCET/ROXICET) 5-325 MG per tablet 1 tablet (1 tablet Oral Given 12/14/13 1639)  ondansetron (ZOFRAN-ODT) disintegrating tablet 8  mg (8 mg Oral Given 12/14/13 1843)    Labs Review Labs Reviewed  URINALYSIS, ROUTINE W REFLEX MICROSCOPIC - Abnormal; Notable for the following:    APPearance CLOUDY (*)    All other components within normal limits    Imaging Review Ct Abdomen Pelvis Wo Contrast  12/14/2013   CLINICAL DATA:  Bloody  urine since yesterday morning.  Mild pain.  EXAM: CT ABDOMEN AND PELVIS WITHOUT CONTRAST  TECHNIQUE: Multidetector CT imaging of the abdomen and pelvis was performed following the standard protocol without IV contrast.  COMPARISON:  None.  FINDINGS: There is minor subsegmental atelectasis at the lung bases. Heart is normal in size.  Liver, spleen, gallbladder, pancreas: Normal.  No adrenal masses.  Right kidney is absent. Left kidney appears mildly enlarged which suggests compensatory hypertrophy. It measures 14.3 cm in length. No renal masses. There are 2 small contiguous nonobstructing stones in the lower pole. No hydronephrosis. Normal left ureter. Bladder is unremarkable.  No pathologically enlarged lymph nodes. No abnormal fluid collections.  Normal colon and small bowel.  Normal appendix.  No significant bony abnormality.  IMPRESSION: 1. No acute findings. No findings to explain hematuria or abdominal pain. 2. 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 3. Mild compensatory hypertrophy of the left kidney. Two small nonobstructing stones in the lower pole of the left kidney. 4. No other abnormalities.   Electronically Signed   By: Lajean Manes M.D.   On: 12/14/2013 19:33     EKG Interpretation None      MDM   Final diagnoses:  Hematuria    Filed Vitals:   12/14/13 1945  BP:   Pulse: 82  Temp:   Resp:    Afebrile, NAD, non-toxic appearing, AAOx4. Abdomen soft, non-tender, non-distended. Left sided back pain noted. No evidence of UTI. Patient non-concerned for STIs. CT scan reviewed. No evidence of ureteral stone, two small non-obstructing stones noted in lower pole of left kidney. Pain and symptoms managed in ED. Return precautions discussed. Patient is agreeable to plan. Patient is stable at time of discharge        Harlow Mares, PA-C 12/14/13 2046

## 2013-12-14 NOTE — Discharge Instructions (Signed)
Please follow up with your primary care physician in 1-2 days. If you do not have one please call the [facility] number listed above. Please take pain medication and/or muscle relaxants as prescribed and as needed for pain. Please do not drive on narcotic pain medication or on muscle relaxants. Please read all discharge instructions and return precautions.    Hematuria Hematuria is blood in your urine. It can be caused by a bladder infection, kidney infection, prostate infection, kidney stone, or cancer of your urinary tract. Infections can usually be treated with medicine, and a kidney stone usually will pass through your urine. If neither of these is the cause of your hematuria, further workup to find out the reason may be needed. It is very important that you tell your health care provider about any blood you see in your urine, even if the blood stops without treatment or happens without causing pain. Blood in your urine that happens and then stops and then happens again can be a symptom of a very serious condition. Also, pain is not a symptom in the initial stages of many urinary cancers. HOME CARE INSTRUCTIONS   Drink lots of fluid, 3-4 quarts a day. If you have been diagnosed with an infection, cranberry juice is especially recommended, in addition to large amounts of water.  Avoid caffeine, tea, and carbonated beverages because they tend to irritate the bladder.  Avoid alcohol because it may irritate the prostate.  Take all medicines as directed by your health care provider.  If you were prescribed an antibiotic medicine, finish it all even if you start to feel better.  If you have been diagnosed with a kidney stone, follow your health care provider's instructions regarding straining your urine to catch the stone.  Empty your bladder often. Avoid holding urine for long periods of time.  After a bowel movement, women should cleanse front to back. Use each tissue only  once.  Empty your bladder before and after sexual intercourse if you are a male. SEEK MEDICAL CARE IF:  You develop back pain.  You have a fever.  You have a feeling of sickness in your stomach (nausea) or vomiting.  Your symptoms are not better in 3 days. Return sooner if you are getting worse. SEEK IMMEDIATE MEDICAL CARE IF:   You develop severe vomiting and are unable to keep the medicine down.  You develop severe back or abdominal pain despite taking your medicines.  You begin passing a large amount of blood or clots in your urine.  You feel extremely weak or faint, or you pass out. MAKE SURE YOU:   Understand these instructions.  Will watch your condition.  Will get help right away if you are not doing well or get worse. Document Released: 04/02/2005 Document Revised: 08/17/2013 Document Reviewed: 12/01/2012 [facility] Patient Information 2015 [place], [place]. This information is not intended to replace advice given to you by your health care provider. Make sure you discuss any questions you have with your health care provider.

## 2013-12-14 NOTE — ED Notes (Addendum)
Pt reports having blood in urine since yesterday morning and only reports mild pain with urination. Also having left side pain when he stands up. No acute distress noted at triage. Does have hx of kidney stones.

## 2013-12-15 NOTE — ED Provider Notes (Signed)
Medical screening examination/treatment/procedure(s) were performed by non-physician practitioner and as supervising physician I was immediately available for consultation/collaboration.   EKG Interpretation None        Orpah Greek, MD 12/15/13 Laureen Abrahams

## 2013-12-28 ENCOUNTER — Emergency Department (HOSPITAL_COMMUNITY)
Admission: EM | Admit: 2013-12-28 | Discharge: 2013-12-29 | Disposition: A | Discharge status: Home / Self Care | Payer: No Typology Code available for payment source | Attending: Emergency Medicine | Admitting: Emergency Medicine

## 2013-12-28 ENCOUNTER — Encounter (HOSPITAL_COMMUNITY): Payer: Self-pay | Admitting: Emergency Medicine

## 2013-12-28 DIAGNOSIS — R5381 Other malaise: Secondary | ICD-10-CM | POA: Insufficient documentation

## 2013-12-28 DIAGNOSIS — R5383 Other fatigue: Secondary | ICD-10-CM

## 2013-12-28 DIAGNOSIS — M545 Low back pain, unspecified: Secondary | ICD-10-CM | POA: Insufficient documentation

## 2013-12-28 DIAGNOSIS — R51 Headache: Secondary | ICD-10-CM | POA: Insufficient documentation

## 2013-12-28 DIAGNOSIS — R519 Headache, unspecified: Secondary | ICD-10-CM

## 2013-12-28 DIAGNOSIS — G8929 Other chronic pain: Secondary | ICD-10-CM | POA: Insufficient documentation

## 2013-12-28 DIAGNOSIS — Z79899 Other long term (current) drug therapy: Secondary | ICD-10-CM | POA: Insufficient documentation

## 2013-12-28 DIAGNOSIS — R209 Unspecified disturbances of skin sensation: Secondary | ICD-10-CM | POA: Insufficient documentation

## 2013-12-28 DIAGNOSIS — Z87448 Personal history of other diseases of urinary system: Secondary | ICD-10-CM | POA: Insufficient documentation

## 2013-12-28 LAB — COMPREHENSIVE METABOLIC PANEL
ALBUMIN: 4 g/dL (ref 3.5–5.2)
ALT: 26 U/L (ref 0–53)
ANION GAP: 16 — AB (ref 5–15)
AST: 25 U/L (ref 0–37)
Alkaline Phosphatase: 64 U/L (ref 39–117)
BUN: 15 mg/dL (ref 6–23)
CALCIUM: 9.8 mg/dL (ref 8.4–10.5)
CO2: 23 mEq/L (ref 19–32)
Chloride: 102 mEq/L (ref 96–112)
Creatinine, Ser: 1.26 mg/dL (ref 0.50–1.35)
GFR calc non Af Amer: 68 mL/min — ABNORMAL LOW (ref >90)
GFR, EST AFRICAN AMERICAN: 79 mL/min — AB (ref >90)
GLUCOSE: 86 mg/dL (ref 70–99)
POTASSIUM: 4.3 meq/L (ref 3.7–5.3)
SODIUM: 141 meq/L (ref 137–147)
TOTAL PROTEIN: 7.4 g/dL (ref 6.0–8.3)
Total Bilirubin: 0.4 mg/dL (ref 0.3–1.2)

## 2013-12-28 LAB — CBC
HEMATOCRIT: 41.6 % (ref 39.0–52.0)
HEMOGLOBIN: 14 g/dL (ref 13.0–17.0)
MCH: 32.2 pg (ref 26.0–34.0)
MCHC: 33.7 g/dL (ref 30.0–36.0)
MCV: 95.6 fL (ref 78.0–100.0)
Platelets: 180 10*3/uL (ref 150–400)
RBC: 4.35 MIL/uL (ref 4.22–5.81)
RDW: 13.2 % (ref 11.5–15.5)
WBC: 6.2 10*3/uL (ref 4.0–10.5)

## 2013-12-28 LAB — I-STAT TROPONIN, ED: TROPONIN I, POC: 0 ng/mL (ref 0.00–0.08)

## 2013-12-28 MED ORDER — SODIUM CHLORIDE 0.9 % IV BOLUS (SEPSIS)
1000.0000 mL | Freq: Once | INTRAVENOUS | Status: AC
  Administered 2013-12-28: 1000 mL via INTRAVENOUS

## 2013-12-28 MED ORDER — KETOROLAC TROMETHAMINE 30 MG/ML IJ SOLN
30.0000 mg | Freq: Once | INTRAMUSCULAR | Status: AC
  Administered 2013-12-28: 30 mg via INTRAVENOUS
  Filled 2013-12-28: qty 1

## 2013-12-28 MED ORDER — DIPHENHYDRAMINE HCL 50 MG/ML IJ SOLN
25.0000 mg | Freq: Once | INTRAMUSCULAR | Status: AC
Start: 2013-12-28 — End: 2013-12-28
  Administered 2013-12-28: 25 mg via INTRAVENOUS
  Filled 2013-12-28: qty 1

## 2013-12-28 MED ORDER — PROCHLORPERAZINE EDISYLATE 5 MG/ML IJ SOLN
10.0000 mg | Freq: Once | INTRAMUSCULAR | Status: AC
  Administered 2013-12-28: 10 mg via INTRAVENOUS
  Filled 2013-12-28: qty 2
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
## 2013-12-28 NOTE — ED Notes (Signed)
Pt since Saturday has had headache, numbness in his right hand, generalized weakness. Pt is alert and oriented. Vomiting x 1.

## 2013-12-28 NOTE — ED Provider Notes (Signed)
I saw and evaluated the patient, reviewed the resident's note and I agree with the findings and plan.  Please see my separate note regarding my evaluation of the patient.  Clinical Impression:    Final diagnoses:  Intractable episodic headache, unspecified headache type  Chronic lower back pain     Johnna Acosta, MD 12/28/13 2341

## 2013-12-28 NOTE — ED Provider Notes (Signed)
CSN: [id number]     Arrival date & time 12/28/13  1616 History   First MD Initiated Contact with Patient 12/28/13 2111     Chief Complaint  Patient presents with  . Headache  . Weakness  . Numbness    Patient is a 43 y.o. male presenting with back pain. The history is provided by the patient.  Back Pain Location:  Lumbar spine Quality:  Aching Pain severity:  Moderate Chronicity:  Chronic Context: not recent injury   Relieved by:  Being still Associated symptoms: paresthesias and weakness   Associated symptoms: no abdominal pain, no bladder incontinence, no bowel incontinence, no chest pain, no dysuria, no fever, no numbness and no perianal numbness   Pt presents with some unknown renal disorder who has had multiple complaints for past 3-4 days.  He states he has a headache at his temples that is different from prior headaches. He notes intermittent right hand weakness and parasthesias.  Today he fell to the ground from lower lumbar back pain which is a chronic problem. It does not radiate down to his legs. He noted weakness to b/l LE with the pain but this passed and he was able to ambulate on own accord.  He has felt lightheaded for the past few days as well. No vertigo.  Difficulty keeping water in his mouth when drinking. Wife has not noted facial asymetry   Pt says he is very stressed at work and wife wonders if that is contributing to condition  Past Medical History  Diagnosis Date  . Renal disorder    History reviewed. No pertinent past surgical history. No family history on file. History  Substance Use Topics  . Smoking status: Never Smoker   . Smokeless tobacco: Not on file  . Alcohol Use: Yes     Comment: 40 oz a day of beer    Review of Systems  Constitutional: Negative for fever and chills.  Respiratory: Negative for cough, shortness of breath and wheezing.   Cardiovascular: Negative for chest pain.  Gastrointestinal: Negative for nausea, vomiting, abdominal pain  and bowel incontinence.  Genitourinary: Negative for bladder incontinence and dysuria.  Musculoskeletal: Positive for back pain.  Skin: Negative for rash.  Neurological: Positive for weakness and paresthesias. Negative for numbness.  All other systems reviewed and are negative.   Allergies  Diclofenac and Prednisone  Home Medications   Prior to Admission medications   Medication Sig Start Date End Date Taking? Authorizing Provider  Ibuprofen-Diphenhydramine HCl (ADVIL PM) 200-25 MG CAPS Take 1 tablet by mouth at bedtime as needed (sleep).   Yes Historical Provider, MD  omeprazole (PRILOSEC) 20 MG capsule Take 20 mg by mouth daily as needed (heart burn).   Yes Historical Provider, MD  ondansetron (ZOFRAN-ODT) 4 MG disintegrating tablet Take 4 mg by mouth every 8 (eight) hours as needed for nausea or vomiting.   Yes Historical Provider, MD  traMADol (ULTRAM) 50 MG tablet Take 50 mg by mouth every 6 (six) hours as needed for moderate pain.   Yes Historical Provider, MD   BP 112/88  Pulse 77  Temp(Src) 98.6 F (37 C) (Oral)  Resp 22  Ht 6\' 1"  (1.854 m)  Wt 190 lb (86.183 kg)  BMI 25.07 kg/m2  SpO2 96% Physical Exam  Nursing note and vitals reviewed. Constitutional: He appears well-developed and well-nourished. No distress.  HENT:  Head: Normocephalic and atraumatic.  Nose: Nose normal.  Eyes: Conjunctivae are normal. Pupils are equal, round, and reactive to  light.  Neck: Normal range of motion. Neck supple. No tracheal deviation present.  No nuchal rigidity. No carotid bruit  Cardiovascular: Normal rate, regular rhythm, normal heart sounds and intact distal pulses.   No murmur heard. Pulmonary/Chest: Effort normal and breath sounds normal. No respiratory distress. He has no rales.  Abdominal: Soft. Bowel sounds are normal. He exhibits no distension and no mass. There is no tenderness. There is no guarding.  Musculoskeletal: Normal range of motion. He exhibits no edema and no  tenderness.  Back: Midline, no obvious deformity.  FROM demonstrated. No midline tenderness. No paraspinous tenderness. Negative straight leg raise.  Neurological:  Alert and oriented x3. CN 3-12 tested and without deficit. 5/5 muscle strength in all extremities with flexion and extension.  Normal bulk and tone.  No sensory deficit to light touch.  Tandem gait normal. Neg Brudzinski and Kernigs sign.  Symmetric and equal 2+ patellar and brachioradialis DTRs.  No pronator drift.  Normal heel-to-shin and finger-to-nose.  Toes flexor bilaterally.   Skin: Skin is warm and dry. No rash noted.  Psychiatric: He has a normal mood and affect.    ED Course  Procedures (including critical care time) Labs Review Labs Reviewed  COMPREHENSIVE METABOLIC PANEL - Abnormal; Notable for the following:    GFR calc non Af Amer 68 (*)    GFR calc Af Amer 79 (*)    Anion gap 16 (*)    All other components within normal limits  CBC  I-STAT TROPOININ, ED    Imaging Review No results found.   EKG Interpretation   Date/Time:  Monday December 28 2013 17:13:33 EDT Ventricular Rate:  80 PR Interval:  146 QRS Duration: 74 QT Interval:  338 QTC Calculation: 389 R Axis:   42 Text Interpretation:  Normal sinus rhythm Possible Left atrial enlargement  Early repolarization Borderline ECG No old tracing to compare Confirmed by  MILLER  MD, [place] ([id number]) on 12/28/2013 10:20:53 PM      MDM   Final diagnoses:  Intractable episodic headache, unspecified headache type  Chronic lower back pain    Pt presents with multiple nonspecific symptoms including HA and numbness. Has chronic lower back pain.  HA is not worst of life. Neuro exam normal. Doubt CVA, outside window anyway for treatment.   Doubt SAH.  No meningeal signs.  Etiology not well defined.  Labs unremarkable.  Will treat HA.  May be explained by migraine.  Certainly consider stress given social circumstances which pt agrees.    11:07 PM Feels better  after HA cocktail.  Neuro exam normal. D.c home   Tammy Sours, MD 12/28/13 2308

## 2013-12-28 NOTE — ED Notes (Signed)
Pt reports bilateral temporal headache x 1.5 weeks. States he has had nausea and vomiting with the headache. States that he has migraines, but this one feels different. Denies fever or diarrhea. States he just started working third shift a [employer] and has had decreased appetite since then. Pt alert and oriented x 4, neuro intact.

## 2013-12-29 ENCOUNTER — Emergency Department (HOSPITAL_COMMUNITY): Payer: No Typology Code available for payment source

## 2013-12-29 ENCOUNTER — Emergency Department (HOSPITAL_COMMUNITY)
Admission: EM | Admit: 2013-12-29 | Discharge: 2013-12-29 | Disposition: A | Discharge status: Home / Self Care | Payer: Self-pay | Attending: Emergency Medicine | Admitting: Emergency Medicine

## 2013-12-29 ENCOUNTER — Encounter (HOSPITAL_COMMUNITY): Payer: Self-pay | Admitting: Emergency Medicine

## 2013-12-29 DIAGNOSIS — G5601 Carpal tunnel syndrome, right upper limb: Secondary | ICD-10-CM

## 2013-12-29 DIAGNOSIS — H539 Unspecified visual disturbance: Secondary | ICD-10-CM | POA: Insufficient documentation

## 2013-12-29 DIAGNOSIS — G43109 Migraine with aura, not intractable, without status migrainosus: Secondary | ICD-10-CM | POA: Insufficient documentation

## 2013-12-29 DIAGNOSIS — N189 Chronic kidney disease, unspecified: Secondary | ICD-10-CM | POA: Insufficient documentation

## 2013-12-29 DIAGNOSIS — R112 Nausea with vomiting, unspecified: Secondary | ICD-10-CM | POA: Insufficient documentation

## 2013-12-29 DIAGNOSIS — G5600 Carpal tunnel syndrome, unspecified upper limb: Secondary | ICD-10-CM | POA: Insufficient documentation

## 2013-12-29 DIAGNOSIS — R209 Unspecified disturbances of skin sensation: Secondary | ICD-10-CM | POA: Insufficient documentation

## 2013-12-29 DIAGNOSIS — Z87448 Personal history of other diseases of urinary system: Secondary | ICD-10-CM | POA: Insufficient documentation

## 2013-12-29 LAB — BASIC METABOLIC PANEL
ANION GAP: 17 — AB (ref 5–15)
BUN: 9 mg/dL (ref 6–23)
CALCIUM: 9 mg/dL (ref 8.4–10.5)
CHLORIDE: 102 meq/L (ref 96–112)
CO2: 22 meq/L (ref 19–32)
CREATININE: 1.23 mg/dL (ref 0.50–1.35)
GFR calc Af Amer: 81 mL/min — ABNORMAL LOW (ref >90)
GFR calc non Af Amer: 70 mL/min — ABNORMAL LOW (ref >90)
GLUCOSE: 83 mg/dL (ref 70–99)
Potassium: 4 mEq/L (ref 3.7–5.3)
Sodium: 141 mEq/L (ref 137–147)

## 2013-12-29 LAB — CBC
HCT: 41.2 % (ref 39.0–52.0)
Hemoglobin: 14.3 g/dL (ref 13.0–17.0)
MCH: 32.6 pg (ref 26.0–34.0)
MCHC: 34.7 g/dL (ref 30.0–36.0)
MCV: 93.8 fL (ref 78.0–100.0)
PLATELETS: 182 10*3/uL (ref 150–400)
RBC: 4.39 MIL/uL (ref 4.22–5.81)
RDW: 13 % (ref 11.5–15.5)
WBC: 5.5 10*3/uL (ref 4.0–10.5)

## 2013-12-29 MED ORDER — KETOROLAC TROMETHAMINE 10 MG PO TABS
10.0000 mg | ORAL_TABLET | Freq: Once | ORAL | Status: AC
  Administered 2013-12-29: 10 mg via ORAL
  Filled 2013-12-29: qty 1

## 2013-12-29 MED ORDER — KETOROLAC TROMETHAMINE 10 MG PO TABS: 10.0000 mg | ORAL_TABLET | Freq: Four times a day (QID) | ORAL | Status: DC | PRN

## 2013-12-29 MED ORDER — PROCHLORPERAZINE EDISYLATE 5 MG/ML IJ SOLN
10.0000 mg | Freq: Once | INTRAMUSCULAR | Status: AC
  Administered 2013-12-29: 10 mg via INTRAVENOUS
  Filled 2013-12-29: qty 2

## 2013-12-29 MED ORDER — DIPHENHYDRAMINE HCL 50 MG/ML IJ SOLN
25.0000 mg | Freq: Once | INTRAMUSCULAR | Status: AC
  Administered 2013-12-29: 25 mg via INTRAVENOUS
  Filled 2013-12-29: qty 1

## 2013-12-29 MED ORDER — SODIUM CHLORIDE 0.9 % IV BOLUS (SEPSIS)
1000.0000 mL | Freq: Once | INTRAVENOUS | Status: AC
  Administered 2013-12-29: 1000 mL via INTRAVENOUS

## 2013-12-29 MED ORDER — PROMETHAZINE HCL 25 MG PO TABS: 25.0000 mg | ORAL_TABLET | Freq: Four times a day (QID) | ORAL | Status: DC | PRN

## 2013-12-29 NOTE — ED Notes (Signed)
Pt presents to department for evaluation of headache, L sided double vision and R hand numbness. Ongoing since yesterday, was seen for same. No relief of symptoms at home. Pt is alert and oriented x4. 8/10 throbbing headache at the time. No neurological deficits noted.

## 2013-12-29 NOTE — ED Notes (Signed)
Pt A&OX4, ambulatory at d/c with steady gait, NAD 

## 2013-12-29 NOTE — ED Notes (Signed)
Hannah PA at bedside
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
## 2013-12-29 NOTE — Discharge Instructions (Signed)
1. Medications: Toradol, Phenergan, usual home medications 2. Treatment: rest, drink plenty of fluids,  3. Follow Up: Please followup with the [facility] for further evaluation of your right hand numbness and further headaches.  Recommend urology referral for further headache management.   Migraine Headache A migraine headache is an intense, throbbing pain on one or both sides of your head. A migraine can last for 30 minutes to several hours. CAUSES  The exact cause of a migraine headache is not always known. However, a migraine may be caused when nerves in the brain become irritated and release chemicals that cause inflammation. This causes pain. Certain things may also trigger migraines, such as:  Alcohol.  Smoking.  Stress.  Menstruation.  Aged cheeses.  Foods or drinks that contain nitrates, glutamate, aspartame, or tyramine.  Lack of sleep.  Chocolate.  Caffeine.  Hunger.  Physical exertion.  Fatigue.  Medicines used to treat chest pain (nitroglycerine), birth control pills, estrogen, and some blood pressure medicines. SIGNS AND SYMPTOMS  Pain on one or both sides of your head.  Pulsating or throbbing pain.  Severe pain that prevents daily activities.  Pain that is aggravated by any physical activity.  Nausea, vomiting, or both.  Dizziness.  Pain with exposure to bright lights, loud noises, or activity.  General sensitivity to bright lights, loud noises, or smells. Before you get a migraine, you may get warning signs that a migraine is coming (aura). An aura may include:  Seeing flashing lights.  Seeing bright spots, halos, or zigzag lines.  Having tunnel vision or blurred vision.  Having feelings of numbness or tingling.  Having trouble talking.  Having muscle weakness. DIAGNOSIS  A migraine headache is often diagnosed based on:  Symptoms.  Physical exam.  A CT scan or MRI of your head. These imaging tests cannot  diagnose migraines, but they can help rule out other causes of headaches. TREATMENT Medicines may be given for pain and nausea. Medicines can also be given to help prevent recurrent migraines.  HOME CARE INSTRUCTIONS  Only take over-the-counter or prescription medicines for pain or discomfort as directed by your health care provider. The use of long-term narcotics is not recommended.  Lie down in a dark, quiet room when you have a migraine.  Keep a journal to find out what may trigger your migraine headaches. For example, write down:  What you eat and drink.  How much sleep you get.  Any change to your diet or medicines.  Limit alcohol consumption.  Quit smoking if you smoke.  Get 7-9 hours of sleep, or as recommended by your health care provider.  Limit stress.  Keep lights dim if bright lights bother you and make your migraines worse. SEEK IMMEDIATE MEDICAL CARE IF:   Your migraine becomes severe.  You have a fever.  You have a stiff neck.  You have vision loss.  You have muscular weakness or loss of muscle control.  You start losing your balance or have trouble walking.  You feel faint or pass out.  You have severe symptoms that are different from your first symptoms. MAKE SURE YOU:   Understand these instructions.  Will watch your condition.  Will get help right away if you are not doing well or get worse. Document Released: 04/02/2005 Document Revised: 08/17/2013 Document Reviewed: 12/08/2012 [facility] Patient Information 2015 [place], [place]. This information is not intended to replace advice given to you by your health care provider. Make sure you discuss any  questions you have with your health care provider.

## 2013-12-30 NOTE — ED Provider Notes (Signed)
Medical screening examination/treatment/procedure(s) were conducted as a shared visit with non-physician practitioner(s) and myself.  I personally evaluated the patient during the encounter.  Gradual onset frontal headache since yesterday.  Associated with "eye fluttering", blurry vision, nausea and tingling in R hand.  Seen for same yesterday. Told yesterday he probably had migraines.  CN 2-12 intact, no ataxia on finger to nose, no nystagmus, 5/5 strength throughout, no pronator drift, Romberg negative, normal gait. No meningismus.    EKG Interpretation None       [name], MD 12/30/13 [phone]

## 2014-02-08 ENCOUNTER — Telehealth (HOSPITAL_BASED_OUTPATIENT_CLINIC_OR_DEPARTMENT_OTHER): Payer: Self-pay | Admitting: Emergency Medicine

## 2014-02-23 ENCOUNTER — Emergency Department (HOSPITAL_COMMUNITY)
Admission: EM | Admit: 2014-02-23 | Discharge: 2014-02-24 | Disposition: A | Discharge status: Home / Self Care | Payer: Self-pay | Attending: Emergency Medicine | Admitting: Emergency Medicine

## 2014-02-23 ENCOUNTER — Emergency Department (HOSPITAL_COMMUNITY): Payer: No Typology Code available for payment source

## 2014-02-23 ENCOUNTER — Encounter (HOSPITAL_COMMUNITY): Payer: Self-pay | Admitting: *Deleted

## 2014-02-23 DIAGNOSIS — R109 Unspecified abdominal pain: Secondary | ICD-10-CM

## 2014-02-23 DIAGNOSIS — N179 Acute kidney failure, unspecified: Secondary | ICD-10-CM | POA: Insufficient documentation

## 2014-02-23 DIAGNOSIS — Q600 Renal agenesis, unilateral: Secondary | ICD-10-CM | POA: Insufficient documentation

## 2014-02-23 DIAGNOSIS — N201 Calculus of ureter: Secondary | ICD-10-CM | POA: Insufficient documentation

## 2014-02-23 LAB — URINALYSIS, ROUTINE W REFLEX MICROSCOPIC
Glucose, UA: NEGATIVE mg/dL
Ketones, ur: 15 mg/dL — AB
NITRITE: POSITIVE — AB
PROTEIN: 30 mg/dL — AB
Specific Gravity, Urine: 1.014 (ref 1.005–1.030)
Urobilinogen, UA: 1 mg/dL (ref 0.0–1.0)
pH: 5 (ref 5.0–8.0)

## 2014-02-23 LAB — CBC WITH DIFFERENTIAL/PLATELET
BASOS PCT: 0 % (ref 0–1)
Basophils Absolute: 0 10*3/uL (ref 0.0–0.1)
EOS PCT: 1 % (ref 0–5)
Eosinophils Absolute: 0.1 10*3/uL (ref 0.0–0.7)
HEMATOCRIT: 41.6 % (ref 39.0–52.0)
HEMOGLOBIN: 14.1 g/dL (ref 13.0–17.0)
Lymphocytes Relative: 48 % — ABNORMAL HIGH (ref 12–46)
Lymphs Abs: 3 10*3/uL (ref 0.7–4.0)
MCH: 32 pg (ref 26.0–34.0)
MCHC: 33.9 g/dL (ref 30.0–36.0)
MCV: 94.3 fL (ref 78.0–100.0)
MONO ABS: 0.3 10*3/uL (ref 0.1–1.0)
MONOS PCT: 4 % (ref 3–12)
NEUTROS ABS: 2.9 10*3/uL (ref 1.7–7.7)
Neutrophils Relative %: 47 % (ref 43–77)
Platelets: 188 10*3/uL (ref 150–400)
RBC: 4.41 MIL/uL (ref 4.22–5.81)
RDW: 13.1 % (ref 11.5–15.5)
WBC: 6.2 10*3/uL (ref 4.0–10.5)

## 2014-02-23 LAB — COMPREHENSIVE METABOLIC PANEL
ALT: 27 U/L (ref 0–53)
ANION GAP: 19 — AB (ref 5–15)
AST: 26 U/L (ref 0–37)
Albumin: 3.9 g/dL (ref 3.5–5.2)
Alkaline Phosphatase: 76 U/L (ref 39–117)
BUN: 14 mg/dL (ref 6–23)
CALCIUM: 9.3 mg/dL (ref 8.4–10.5)
CHLORIDE: 97 meq/L (ref 96–112)
CO2: 22 mEq/L (ref 19–32)
CREATININE: 1.58 mg/dL — AB (ref 0.50–1.35)
GFR calc Af Amer: 60 mL/min — ABNORMAL LOW (ref >90)
GFR, EST NON AFRICAN AMERICAN: 52 mL/min — AB (ref >90)
Glucose, Bld: 110 mg/dL — ABNORMAL HIGH (ref 70–99)
Potassium: 3.7 mEq/L (ref 3.7–5.3)
Sodium: 138 mEq/L (ref 137–147)
Total Bilirubin: 0.5 mg/dL (ref 0.3–1.2)
Total Protein: 7.5 g/dL (ref 6.0–8.3)

## 2014-02-23 LAB — URINE MICROSCOPIC-ADD ON

## 2014-02-23 MED ORDER — SODIUM CHLORIDE 0.9 % IV BOLUS (SEPSIS)
1000.0000 mL | Freq: Once | INTRAVENOUS | Status: AC
  Administered 2014-02-23: 1000 mL via INTRAVENOUS

## 2014-02-23 MED ORDER — FENTANYL CITRATE 0.05 MG/ML IJ SOLN
INTRAMUSCULAR | Status: AC
  Filled 2014-02-23: qty 2

## 2014-02-23 MED ORDER — FENTANYL CITRATE 0.05 MG/ML IJ SOLN
50.0000 ug | Freq: Once | INTRAMUSCULAR | Status: AC
  Administered 2014-02-23: 50 ug via NASAL

## 2014-02-23 MED ORDER — HYDROMORPHONE HCL 1 MG/ML IJ SOLN
1.0000 mg | Freq: Once | INTRAMUSCULAR | Status: AC
  Administered 2014-02-23: 1 mg via INTRAVENOUS
  Filled 2014-02-23: qty 1

## 2014-02-23 MED ORDER — CEFTRIAXONE SODIUM 1 G IJ SOLR
1.0000 g | Freq: Once | INTRAMUSCULAR | Status: AC
  Administered 2014-02-23: 1 g via INTRAVENOUS
  Filled 2014-02-23: qty 10

## 2014-02-23 MED ORDER — ONDANSETRON 4 MG PO TBDP
8.0000 mg | ORAL_TABLET | Freq: Once | ORAL | Status: AC
  Administered 2014-02-23: 8 mg via ORAL
  Filled 2014-02-23: qty 2

## 2014-02-23 MED ORDER — ONDANSETRON HCL 4 MG/2ML IJ SOLN
4.0000 mg | Freq: Once | INTRAMUSCULAR | Status: AC
  Administered 2014-02-23: 4 mg via INTRAVENOUS
  Filled 2014-02-23: qty 2

## 2014-02-23 MED ORDER — CEPHALEXIN 500 MG PO CAPS: 500.0000 mg | ORAL_CAPSULE | Freq: Four times a day (QID) | ORAL | Status: DC

## 2014-02-23 NOTE — ED Notes (Signed)
Pt reminded that urine sample is needed to continue his care, states that he will continue to try.

## 2014-02-23 NOTE — ED Notes (Signed)
Pt has history of kidney stones and states left flank pain and radiation to left abdomen.

## 2014-02-23 NOTE — Discharge Instructions (Signed)
Please read and follow all provided instructions.  Your diagnoses today include:  1. Left ureteral stone   2. Left flank pain   3. Flank pain   4. Acute kidney injury     Tests performed today include:  Urine test that showed blood in your urine and possible infection  Blood test that showed slightly weak kidney function  Vital signs. See below for your results today.   Medications prescribed:   Keflex (cephalexin) - antibiotic  You have been prescribed an antibiotic medicine: take the entire course of medicine even if you are feeling better. Stopping early can cause the antibiotic not to work.  Take any prescribed medications only as directed.  Home care instructions:  Follow any educational materials contained in this packet.  Please double your fluid intake for the next several days. Strain your urine and save any stones that may pass.   Follow-up instructions: Please follow-up with your urologist or the urologist referral (provided on front page) in the next 1 week for further evaluation of your symptoms.  If you need to return to the Emergency Department, go to [facility] and not [facility]. The urologists are located at [facility] and can better care for you at this location.  Return instructions:  If you need to return to the Emergency Department, go to [facility] and not [facility]. The urologists are located at [facility] and can better care for you at this location.   Please return to the Emergency Department if you experience worsening symptoms.  Please return if you develop fever or uncontrolled pain or vomiting.  Please return if you have any other emergent concerns.  Additional Information:  Your vital signs today were: BP 150/99 mmHg   Pulse 86   Temp(Src) 97.4 F (36.3 C) (Oral)   Resp 17   Ht 6\' 2"  (1.88 m)   Wt 189 lb (85.73 kg)   BMI 24.26 kg/m2   SpO2 97% If your blood pressure (BP) was elevated above 135/85  this visit, please have this repeated by your doctor within one month. --------------

## 2014-02-23 NOTE — ED Provider Notes (Signed)
CSN: [id number]     Arrival date & time 02/23/14  1706 History   First MD Initiated Contact with Patient 02/23/14 1827     Chief Complaint  Patient presents with  . Flank Pain     (Consider location/radiation/quality/duration/timing/severity/associated sxs/prior Treatment) HPI Comments: Patient with history of kidney stones, congenitally absent right kidney -- presents with complaint of left flank pain that began around lunchtime today. Pain is in the left flank radiating to the left groin. Pain is similar to previous kidney stones. No treatments prior to arrival. Patient has been vomiting with the pain. He states that he has been unable to urinate since about 4 PM. No dysuria or hematuria. The onset of this condition was acute. The course is constant. Aggravating factors: none. Alleviating factors: none.    Patient is a 43 y.o. male presenting with flank pain. The history is provided by the patient.  Flank Pain Pertinent negatives include no abdominal pain, chest pain, coughing, fever, headaches, myalgias, nausea, rash, sore throat or vomiting.    Past Medical History  Diagnosis Date  . Renal disorder    History reviewed. No pertinent past surgical history. No family history on file. History  Substance Use Topics  . Smoking status: Never Smoker   . Smokeless tobacco: Not on file  . Alcohol Use: Yes     Comment: 40 oz a day of beer    Review of Systems  Constitutional: Negative for fever.  HENT: Negative for rhinorrhea and sore throat.   Eyes: Negative for redness.  Respiratory: Negative for cough.   Cardiovascular: Negative for chest pain.  Gastrointestinal: Negative for nausea, vomiting, abdominal pain and diarrhea.  Genitourinary: Positive for flank pain and difficulty urinating. Negative for dysuria.  Musculoskeletal: Negative for myalgias.  Skin: Negative for rash.  Neurological: Negative for headaches.    Allergies  Diclofenac and Prednisone  Home Medications    Prior to Admission medications   Medication Sig Start Date End Date Taking? Authorizing Provider  acetaminophen (TYLENOL) 500 MG tablet Take 1,000 mg by mouth every 6 (six) hours as needed for headache.    Historical Provider, MD  Ibuprofen-Diphenhydramine HCl (ADVIL PM) 200-25 MG CAPS Take 1 tablet by mouth at bedtime as needed (sleep).    Historical Provider, MD  ketorolac (TORADOL) 10 MG tablet Take 1 tablet (10 mg total) by mouth every 6 (six) hours as needed. 12/29/13   Hannah Muthersbaugh, PA-C  omeprazole (PRILOSEC) 20 MG capsule Take 20 mg by mouth daily as needed (heart burn).    Historical Provider, MD  ondansetron (ZOFRAN-ODT) 4 MG disintegrating tablet Take 4 mg by mouth every 8 (eight) hours as needed for nausea or vomiting.    Historical Provider, MD  promethazine (PHENERGAN) 25 MG tablet Take 1 tablet (25 mg total) by mouth every 6 (six) hours as needed for nausea or vomiting. 12/29/13   Jarrett Soho Muthersbaugh, PA-C  traMADol (ULTRAM) 50 MG tablet Take 50 mg by mouth every 6 (six) hours as needed for moderate pain.    Historical Provider, MD   BP 162/103 mmHg  Pulse 100  Temp(Src) 97.4 F (36.3 C) (Oral)  Resp 24  Ht 6\' 2"  (1.88 m)  Wt 189 lb (85.73 kg)  BMI 24.26 kg/m2  SpO2 100%   Physical Exam  Constitutional: He appears well-developed and well-nourished. He appears distressed (actively vomiting).  HENT:  Head: Normocephalic and atraumatic.  Eyes: Conjunctivae are normal. Right eye exhibits no discharge. Left eye exhibits no discharge.  Neck:  Normal range of motion. Neck supple.  Cardiovascular: Normal rate, regular rhythm and normal heart sounds.   Pulmonary/Chest: Effort normal and breath sounds normal. No respiratory distress. He has no wheezes. He has no rales.  Abdominal: Soft. There is no tenderness.  Genitourinary: Testes normal and penis normal. Right testis shows no swelling and no tenderness. Left testis shows no swelling and no tenderness. Circumcised.   Neurological: He is alert.  Skin: Skin is warm and dry.  Psychiatric: He has a normal mood and affect.  Nursing note and vitals reviewed.   ED Course  Procedures (including critical care time) Labs Review Labs Reviewed  CBC WITH DIFFERENTIAL - Abnormal; Notable for the following:    Lymphocytes Relative 48 (*)    All other components within normal limits  COMPREHENSIVE METABOLIC PANEL - Abnormal; Notable for the following:    Glucose, Bld 110 (*)    Creatinine, Ser 1.58 (*)    GFR calc non Af Amer 52 (*)    GFR calc Af Amer 60 (*)    Anion gap 19 (*)    All other components within normal limits  URINALYSIS, ROUTINE W REFLEX MICROSCOPIC - Abnormal; Notable for the following:    Color, Urine ORANGE (*)    APPearance TURBID (*)    Hgb urine dipstick LARGE (*)    Bilirubin Urine SMALL (*)    Ketones, ur 15 (*)    Protein, ur 30 (*)    Nitrite POSITIVE (*)    Leukocytes, UA SMALL (*)    All other components within normal limits  URINE MICROSCOPIC-ADD ON - Abnormal; Notable for the following:    Bacteria, UA FEW (*)    All other components within normal limits  URINE CULTURE    Imaging Review US Renal  02/23/2014   CLINICAL DATA:  LEFT flank pain  EXAM: RENAL/URINARY TRACT ULTRASOUND COMPLETE  COMPARISON:  CT abdomen and pelvis 12/14/2013  FINDINGS: Right Kidney:  Congenital absence.  Left Kidney:  Length: 14.2 cm. Normal cortical thickness. Increased cortical echogenicity. No mass, hydronephrosis or shadowing calcification. Tiny lower pole calculus seen on prior CT is not definitely sonographically visualized. Small amount of perinephric fluid at upper pole.  Bladder:  Normal appearance. Mild prostatic enlargement, gland measuring 4.1 x 3.7 x 3.3 cm.  IMPRESSION: Congenital absence of RIGHT kidney.  Question medical renal disease changes of LEFT kidney.  Small amount of nonspecific perinephric fluid at upper pole LEFT kidney without gross mass or hydronephrosis.   Electronically  Signed   By: Lavonia Dana M.D.   On: 02/23/2014 20:19     EKG Interpretation None      7:04 PM Patient seen and examined. Work-up initiated. Medications ordered.   Vital signs reviewed and are as follows: BP 162/103 mmHg  Pulse 100  Temp(Src) 97.4 F (36.3 C) (Oral)  Resp 24  Ht 6\' 2"  (1.88 m)  Wt 189 lb (85.73 kg)  BMI 24.26 kg/m2  SpO2 100%   8:46 PM Patient updated on [place] results. Pain is improved. He is still trying to urinate. Will monitor.    Patient discussed with Dr. Leonides Schanz. Concerning for possible infection on UA, although it is not totally convincing. Culture sent.  Patient was able to pass an approximately 3 mm stone in the emergency department with resolution of symptoms.  Will give 1 dose of Rocephin and started on Keflex. Encouraged patient to follow-up with urology for culture results as well as for kidney stone management, especially in light  of his solitary kidney.  Patient urged to return with worsening symptoms or other concerns. Patient verbalized understanding and agrees with plan.    MDM   Final diagnoses:  Left ureteral stone  Acute kidney injury   Patient with left ureteral stone. He has a history of solitary left kidney. Patient's was unable to urinate for some time when he was symptomatic. I suspect that he had an obstructive kidney stone. He had a slight bump in his creatinine likely signifying post renal acute kidney injury. Fortunately, patient passed a small stone and became asymptomatic. Culture sent per discussion above. Will continue Keflex until culture result is followed up. Patient appears very well at time of discharge. He does not have a fever or persistent vomiting to suspect a true pyelonephritis.appropriate return instructions discussed with patient at bedside.    Carlisle Cater, PA-C 02/24/14 [phone]

## 2014-02-23 NOTE — ED Provider Notes (Signed)
Medical screening examination/treatment/procedure(s) were conducted as a shared visit with non-physician practitioner(s) and myself.  I personally evaluated the patient during the encounter.   EKG Interpretation None      Pt is a 43 y.o. M with history of kidney stones with a congenitally absent right kidney presents to the emergency department with left flank pain, hematuria. Patient appears to have a nitrite positive urinary tract infection and while in the emergency department past day small kidney stone and is feeling much better. Able to urinate normally and his urine is are clearing. No vomiting. No fevers. Hemodynamically stable. He does have mild elevation of his creatinine to 1.5. We'll give IV antiemetics in the emergency department but he would like discharge home. He has outpatient follow-up in 3 days. Have advised him to avoid any nephrotoxic medications including NSAIDs. Have discussed strict return precautions. He verbalizes understanding and is comfortable with plan.  [place], DO 02/23/14 2249

## 2014-02-23 NOTE — ED Notes (Signed)
Pt states he is being treated for some little frontal hemorrhage

## 2014-02-25 LAB — URINE CULTURE
COLONY COUNT: NO GROWTH
CULTURE: NO GROWTH
Special Requests: NORMAL

## 2014-08-15 ENCOUNTER — Emergency Department (HOSPITAL_COMMUNITY)
Admission: EM | Admit: 2014-08-15 | Discharge: 2014-08-15 | Disposition: A | Discharge status: Home / Self Care | Payer: Self-pay | Attending: Emergency Medicine | Admitting: Emergency Medicine

## 2014-08-15 ENCOUNTER — Encounter (HOSPITAL_COMMUNITY): Payer: Self-pay

## 2014-08-15 DIAGNOSIS — Z87448 Personal history of other diseases of urinary system: Secondary | ICD-10-CM | POA: Insufficient documentation

## 2014-08-15 DIAGNOSIS — R319 Hematuria, unspecified: Secondary | ICD-10-CM | POA: Insufficient documentation

## 2014-08-15 DIAGNOSIS — Z792 Long term (current) use of antibiotics: Secondary | ICD-10-CM | POA: Insufficient documentation

## 2014-08-15 LAB — URINALYSIS, ROUTINE W REFLEX MICROSCOPIC
Bilirubin Urine: NEGATIVE
Glucose, UA: NEGATIVE mg/dL
KETONES UR: NEGATIVE mg/dL
NITRITE: NEGATIVE
PH: 6.5 (ref 5.0–8.0)
PROTEIN: 30 mg/dL — AB
SPECIFIC GRAVITY, URINE: 1.015 (ref 1.005–1.030)
Urobilinogen, UA: 1 mg/dL (ref 0.0–1.0)

## 2014-08-15 LAB — URINE MICROSCOPIC-ADD ON

## 2014-08-15 NOTE — ED Notes (Signed)
Pt has chronic back pain but went to bathroom this morning and noticed blood in his urine. Has a hx of kidney stones as well.

## 2014-08-15 NOTE — ED Provider Notes (Signed)
CSN: [id number]     Arrival date & time 08/15/14  1638 History   First MD Initiated Contact with Patient 08/15/14 1902     Chief Complaint  Patient presents with  . Flank Pain   (Consider location/radiation/quality/duration/timing/severity/associated sxs/prior Treatment) Patient is a 44 y.o. male presenting with hematuria. The history is provided by the patient. No language interpreter was used.  Hematuria This is a new problem. The current episode started today. The problem occurs constantly. The problem has been unchanged. Associated symptoms include urinary symptoms. Pertinent negatives include no abdominal pain, arthralgias, change in bowel habit, chest pain, chills, congestion, coughing, diaphoresis, fatigue, fever, headaches, nausea, neck pain, numbness, sore throat, vertigo, vomiting or weakness. Nothing aggravates the symptoms. He has tried nothing for the symptoms.    Past Medical History  Diagnosis Date  . Renal disorder    History reviewed. No pertinent past surgical history. No family history on file. History  Substance Use Topics  . Smoking status: Never Smoker   . Smokeless tobacco: Not on file  . Alcohol Use: Yes     Comment: 40 oz a day of beer    Review of Systems  Constitutional: Negative for fever, chills, diaphoresis and fatigue.  HENT: Negative for congestion and sore throat.   Respiratory: Negative for cough, chest tightness and shortness of breath.   Cardiovascular: Negative for chest pain.  Gastrointestinal: Negative for nausea, vomiting, abdominal pain, constipation, blood in stool and change in bowel habit.  Genitourinary: Positive for hematuria. Negative for dysuria, urgency, frequency, flank pain, scrotal swelling, difficulty urinating, penile pain and testicular pain.  Musculoskeletal: Negative for back pain, arthralgias and neck pain.  Neurological: Negative for vertigo, weakness, light-headedness, numbness and headaches.  Psychiatric/Behavioral:  Negative for confusion.  All other systems reviewed and are negative.     Allergies  Diclofenac and Prednisone  Home Medications   Prior to Admission medications   Medication Sig Start Date End Date Taking? Authorizing Provider  acetaminophen (TYLENOL) 500 MG tablet Take 1,000 mg by mouth every 6 (six) hours as needed for headache.    Historical Provider, MD  cephALEXin (KEFLEX) 500 MG capsule Take 1 capsule (500 mg total) by mouth 4 (four) times daily. 02/23/14   Carlisle Cater, PA-C  Ibuprofen-Diphenhydramine HCl (ADVIL PM) 200-25 MG CAPS Take 1 tablet by mouth at bedtime as needed (sleep).    Historical Provider, MD  ketorolac (TORADOL) 10 MG tablet Take 1 tablet (10 mg total) by mouth every 6 (six) hours as needed. Patient not taking: Reported on 02/23/2014 12/29/13   Jarrett Soho Muthersbaugh, PA-C  omeprazole (PRILOSEC) 20 MG capsule Take 20 mg by mouth daily as needed (heart burn).    Historical Provider, MD  promethazine (PHENERGAN) 25 MG tablet Take 1 tablet (25 mg total) by mouth every 6 (six) hours as needed for nausea or vomiting. Patient not taking: Reported on 02/23/2014 12/29/13   Jarrett Soho Muthersbaugh, PA-C  traMADol (ULTRAM) 50 MG tablet Take 50 mg by mouth every 6 (six) hours as needed for moderate pain.    Historical Provider, MD   BP 161/97 mmHg  Pulse 90  Temp(Src) 98.2 F (36.8 C) (Oral)  Resp 17  Ht 6\' 1"  (1.854 m)  Wt 193 lb (87.544 kg)  BMI 25.47 kg/m2  SpO2 98% Physical Exam  Constitutional: He is oriented to person, place, and time. Vital signs are normal. He appears well-developed and well-nourished. He is active. He does not appear ill. No distress.  HENT:  Head: Normocephalic  and atraumatic.  Nose: Nose normal.  Mouth/Throat: Oropharynx is clear and moist. No oropharyngeal exudate.  Eyes: EOM are normal. Pupils are equal, round, and reactive to light.  Neck: Normal range of motion. Neck supple.  Cardiovascular: Normal rate, regular rhythm, normal heart  sounds and intact distal pulses.   No murmur heard. Pulmonary/Chest: Effort normal and breath sounds normal. No respiratory distress. He has no wheezes. He exhibits no tenderness.  Abdominal: Soft. He exhibits no distension. There is no tenderness. There is no guarding.  No CVA or suprapubic tenderness to palpation.   Genitourinary: Penis normal. No penile tenderness.  Normal male GU exam, nontender to palpation of penis, normal testicular lie  Musculoskeletal: Normal range of motion. He exhibits no tenderness.  Neurological: He is alert and oriented to person, place, and time. No cranial nerve deficit. Coordination normal.  Skin: Skin is warm and dry. He is not diaphoretic. No pallor.  Psychiatric: He has a normal mood and affect. His behavior is normal. Judgment and thought content normal.  Nursing note and vitals reviewed.   ED Course  Procedures (including critical care time) Labs Review Labs Reviewed  URINALYSIS, ROUTINE W REFLEX MICROSCOPIC - Abnormal; Notable for the following:    Color, Urine RED (*)    APPearance CLOUDY (*)    Hgb urine dipstick LARGE (*)    Protein, ur 30 (*)    Leukocytes, UA TRACE (*)    All other components within normal limits  URINE CULTURE  URINE MICROSCOPIC-ADD ON   Imaging Review No results found.   EKG Interpretation None      MDM   Final diagnoses:  Hematuria   Pt is a 44 yo M with hx of nephrolithiasis who presents with painless hematuria.   He moved his house yesterday, so did a lot of heavy lifting and decreased PO intake during the day.  Reports that he has urinated 3x today.  First was grossly bloody, no clots.  Second 2 were "pink/red tinged"  No change throughout stream.  Denies dysuria, abd cramping, flank pain, increased frequency, nausea, vomiting, or fevers.  Has a hx of stones and states that today feels nothing like his stones.  Denies any pain, no nausea, no fevers.    Vitals stable, afebrile, not tachycardic.  Patient is  very well appearing, in NAD.  No CVA tenderness to palpation, no abdominal/suprapubic tenderness, normal GU exam.    Urine is grossly hematuric with large blood but not infected.  Likely associated with the muscle breakdown and dehydration from his strenuous activity yesterday.  As he is asymptomatic, doubt stone.   Could have been a small stone that previously passed, but no history of pain yesterday either.  He was advised that he will need close follow up in approximately 1 week to ensure that his blood has cleared.  Educated on the common causes of painless hematuria and he voiced understanding.  Encouraged to stay well hydrated and was given PO fluids while in the ED, which he tolerated well.  Considered stable for discharge home in stable condition.   Patient was seen with ED Attending, Dr. Driscilla Moats, MD    Tori Milks, MD 08/16/14 [place], MD 08/16/14 [phone]

## 2014-08-15 NOTE — Discharge Instructions (Signed)

## 2014-08-17 LAB — URINE CULTURE
Colony Count: NO GROWTH
Culture: NO GROWTH

## 2014-08-19 ENCOUNTER — Emergency Department (HOSPITAL_COMMUNITY)
Admission: EM | Admit: 2014-08-19 | Discharge: 2014-08-19 | Disposition: A | Discharge status: Home / Self Care | Payer: Self-pay | Attending: Emergency Medicine | Admitting: Emergency Medicine

## 2014-08-19 ENCOUNTER — Encounter (HOSPITAL_COMMUNITY): Payer: Self-pay | Admitting: Emergency Medicine

## 2014-08-19 ENCOUNTER — Emergency Department (HOSPITAL_COMMUNITY): Payer: Self-pay

## 2014-08-19 DIAGNOSIS — N23 Unspecified renal colic: Secondary | ICD-10-CM

## 2014-08-19 DIAGNOSIS — R7989 Other specified abnormal findings of blood chemistry: Secondary | ICD-10-CM | POA: Insufficient documentation

## 2014-08-19 DIAGNOSIS — G8929 Other chronic pain: Secondary | ICD-10-CM | POA: Insufficient documentation

## 2014-08-19 DIAGNOSIS — N201 Calculus of ureter: Secondary | ICD-10-CM | POA: Insufficient documentation

## 2014-08-19 LAB — URINALYSIS, ROUTINE W REFLEX MICROSCOPIC
BILIRUBIN URINE: NEGATIVE
Glucose, UA: NEGATIVE mg/dL
Ketones, ur: NEGATIVE mg/dL
Leukocytes, UA: NEGATIVE
Nitrite: NEGATIVE
PH: 5 (ref 5.0–8.0)
PROTEIN: NEGATIVE mg/dL
Specific Gravity, Urine: 1.005 (ref 1.005–1.030)
UROBILINOGEN UA: 0.2 mg/dL (ref 0.0–1.0)

## 2014-08-19 LAB — I-STAT CHEM 8, ED
BUN: 12 mg/dL (ref 6–20)
CREATININE: 1.3 mg/dL — AB (ref 0.61–1.24)
Calcium, Ion: 1.19 mmol/L (ref 1.12–1.23)
Chloride: 102 mmol/L (ref 101–111)
Glucose, Bld: 111 mg/dL — ABNORMAL HIGH (ref 70–99)
HCT: 44 % (ref 39.0–52.0)
Hemoglobin: 15 g/dL (ref 13.0–17.0)
Potassium: 3.7 mmol/L (ref 3.5–5.1)
SODIUM: 139 mmol/L (ref 135–145)
TCO2: 20 mmol/L (ref 0–100)

## 2014-08-19 LAB — URINE MICROSCOPIC-ADD ON

## 2014-08-19 MED ORDER — HYDROMORPHONE HCL 1 MG/ML IJ SOLN
1.0000 mg | Freq: Once | INTRAMUSCULAR | Status: AC
  Administered 2014-08-19: 1 mg via INTRAVENOUS
  Filled 2014-08-19: qty 1

## 2014-08-19 MED ORDER — SODIUM CHLORIDE 0.9 % IV SOLN
INTRAVENOUS | Status: DC
  Administered 2014-08-19: 17:00:00 via INTRAVENOUS

## 2014-08-19 MED ORDER — TAMSULOSIN HCL 0.4 MG PO CAPS: ORAL_CAPSULE | ORAL | Status: DC

## 2014-08-19 MED ORDER — OXYCODONE-ACETAMINOPHEN 5-325 MG PO TABS: 1.0000 | ORAL_TABLET | ORAL | Status: DC | PRN

## 2014-08-19 NOTE — ED Notes (Signed)
Pt stable, ambulatory, states understanding of discharge instructions 

## 2014-08-19 NOTE — ED Notes (Signed)
Patient transported to CT 

## 2014-08-19 NOTE — ED Provider Notes (Signed)
CSN: [id number]     Arrival date & time 08/19/14  1500 History   First MD Initiated Contact with Patient 08/19/14 1607     Chief Complaint  Patient presents with  . Nephrolithiasis     (Consider location/radiation/quality/duration/timing/severity/associated sxs/prior Treatment) HPI   Kyle Berry is a 44 y.o. male here for evaluation of left flank pain radiating to left lower quadrant, which started about an hour prior to coming here. He's had similar pain in the past, when he had a kidney stone. He was here 3 days ago with hematuria, which has since resolved. He denies hematuria, dysuria, urinary frequency or urinary urgency, at this time. He does not have a urologist. He denies recent fever, chills, nausea, vomiting, weakness or dizziness. There are no other known modifying factors.      Past Medical History  Diagnosis Date  . Renal disorder   . Kidney calculi   . Chronic back pain    History reviewed. No pertinent past surgical history. No family history on file. History  Substance Use Topics  . Smoking status: Never Smoker   . Smokeless tobacco: Not on file  . Alcohol Use: Yes     Comment: 40 oz a day of beer    Review of Systems  All other systems reviewed and are negative.     Allergies  Diclofenac and Prednisone  Home Medications   Prior to Admission medications   Medication Sig Start Date End Date Taking? Authorizing Provider  acetaminophen (TYLENOL) 500 MG tablet Take 1,000 mg by mouth every 6 (six) hours as needed for headache.   Yes Historical Provider, MD  Ibuprofen-Diphenhydramine HCl (ADVIL PM) 200-25 MG CAPS Take 1 tablet by mouth at bedtime as needed (sleep).   Yes Historical Provider, MD  omeprazole (PRILOSEC) 20 MG capsule Take 20 mg by mouth daily as needed (heart burn).   Yes Historical Provider, MD  traMADol (ULTRAM) 50 MG tablet Take 50 mg by mouth every 6 (six) hours as needed for moderate pain.   Yes Historical Provider, MD  cephALEXin  (KEFLEX) 500 MG capsule Take 1 capsule (500 mg total) by mouth 4 (four) times daily. Patient not taking: Reported on 08/19/2014 02/23/14   Carlisle Cater, PA-C  ketorolac (TORADOL) 10 MG tablet Take 1 tablet (10 mg total) by mouth every 6 (six) hours as needed. Patient not taking: Reported on 02/23/2014 12/29/13   Jarrett Soho Muthersbaugh, PA-C  oxyCODONE-acetaminophen (PERCOCET) 5-325 MG per tablet Take 1 tablet by mouth every 4 (four) hours as needed for severe pain. 08/19/14   Daleen Bo, MD  promethazine (PHENERGAN) 25 MG tablet Take 1 tablet (25 mg total) by mouth every 6 (six) hours as needed for nausea or vomiting. Patient not taking: Reported on 02/23/2014 12/29/13   Jarrett Soho Muthersbaugh, PA-C  tamsulosin (FLOMAX) 0.4 MG CAPS capsule 1 q HS to aid stone passage 08/19/14   Daleen Bo, MD   BP 141/87 mmHg  Pulse 84  Temp(Src) 97.7 F (36.5 C) (Oral)  Resp 18  SpO2 95% Physical Exam  Constitutional: He is oriented to person, place, and time. He appears well-developed and well-nourished.  HENT:  Head: Normocephalic and atraumatic.  Right Ear: External ear normal.  Left Ear: External ear normal.  Eyes: Conjunctivae and EOM are normal. Pupils are equal, round, and reactive to light.  Neck: Normal range of motion and phonation normal. Neck supple.  Cardiovascular: Normal rate, regular rhythm and normal heart sounds.   Pulmonary/Chest: Effort normal and breath sounds  normal. He exhibits no bony tenderness.  Abdominal: Soft. He exhibits no distension. There is no tenderness. There is no guarding.  Genitourinary:  No costo-vertebral angle tenderness with percussion.  Musculoskeletal: Normal range of motion.  Neurological: He is alert and oriented to person, place, and time. No cranial nerve deficit or sensory deficit. He exhibits normal muscle tone. Coordination normal.  Skin: Skin is warm, dry and intact.  Psychiatric: He has a normal mood and affect. His behavior is normal. Judgment and thought  content normal.  Nursing note and vitals reviewed.   ED Course  Procedures (including critical care time)  Medications  0.9 %  sodium chloride infusion ( Intravenous New Bag/Given 08/19/14 1703)  HYDROmorphone (DILAUDID) injection 1 mg (1 mg Intravenous Given 08/19/14 1703)  HYDROmorphone (DILAUDID) injection 1 mg (1 mg Intravenous Given 08/19/14 1844)    Patient Vitals for the past 24 hrs:  BP Temp Temp src Pulse Resp SpO2  08/19/14 2008 141/87 mmHg - - 84 18 95 %  08/19/14 2000 141/87 mmHg - - 79 - 90 %  08/19/14 1930 153/88 mmHg - - 80 - 91 %  08/19/14 1911 147/86 mmHg - - 76 17 96 %  08/19/14 1910 - - - - - 96 %  08/19/14 1900 147/86 mmHg - - 76 - 90 %  08/19/14 1845 151/88 mmHg - - 77 - 95 %  08/19/14 1815 145/92 mmHg - - 76 - 97 %  08/19/14 1801 144/89 mmHg - - 75 18 96 %  08/19/14 1750 142/85 mmHg - - 74 - 95 %  08/19/14 1715 150/88 mmHg - - 80 - 95 %  08/19/14 1649 151/96 mmHg - - 84 18 94 %  08/19/14 1645 151/96 mmHg - - 82 - 100 %  08/19/14 1615 147/97 mmHg - - 80 - 98 %  08/19/14 1554 155/92 mmHg 97.7 F (36.5 C) Oral 78 20 100 %  08/19/14 1537 162/90 mmHg 97.8 F (36.6 C) Oral 79 18 97 %    8:36 PM Reevaluation with update and discussion. After initial assessment and treatment, an updated evaluation reveals pain is controlled after second dose of hydromorphone. Findings discussed with patient, all questions answered.Daleen Bo L    Labs Review Labs Reviewed  URINALYSIS, ROUTINE W REFLEX MICROSCOPIC - Abnormal; Notable for the following:    Hgb urine dipstick LARGE (*)    All other components within normal limits  I-STAT CHEM 8, ED - Abnormal; Notable for the following:    Creatinine, Ser 1.30 (*)    Glucose, Bld 111 (*)    All other components within normal limits  URINE MICROSCOPIC-ADD ON    Imaging Review Ct Abdomen Pelvis Wo Contrast  08/19/2014   CLINICAL DATA:  left flank pain and left lower quadrant pain 45 min.  EXAM: CT ABDOMEN AND PELVIS WITHOUT  CONTRAST  TECHNIQUE: Multidetector CT imaging of the abdomen and pelvis was performed following the standard protocol without IV contrast.  COMPARISON:  CT 12/14/2013  FINDINGS: Lower chest: Mild basilar atelectasis.  Hepatobiliary: No focal hepatic lesion. No biliary duct dilatation. Gallbladder is normal. Common bile duct is normal.  Pancreas: Pancreas is normal. No ductal dilatation. No pancreatic inflammation.  Spleen: Normal spleen  Adrenals/urinary tract: Adrenal glands are normal. Right kidney is severely atrophic aorta measuring 4 cm in craniocaudad dimension. There is compensatory hypertrophy of the left kidney. Nonobstructing calculus in the lower pole of the left kidney measures 4 mm. There is mild hydroureter on the left  and mild periureteral stranding. There is obstructing calculus within the distal left ureter measuring 3 mm on image 85, series 2. This distal ureteral calculus on the left is approximately 1 cm from the vesicoureteral junction. No bladder calculi are present.  Stomach/Bowel: Stomach, small bowel, appendix, and cecum are normal. The colon and rectosigmoid colon are normal.  Vascular/Lymphatic: Abdominal aorta is normal caliber. There is no retroperitoneal or periportal lymphadenopathy. No pelvic lymphadenopathy.  Reproductive: Prostate gland is normal.  Musculoskeletal: No aggressive osseous lesion.  Other: No free fluid.  IMPRESSION: 1. Obstructing calculus within the distal left ureter approximately 1 cm from the vesicoureteral junction. 2. Nonobstructing calculus in lower pole of the left kidney. 3. Severely atrophic right kidney.   Electronically Signed   By: Suzy Bouchard M.D.   On: 08/19/2014 18:56     EKG Interpretation None      MDM   Final diagnoses:  Ureteral colic  Ureteral stone    Ureteral colic secondary to partially obstructing ureteral stone. Mild elevation of creatinine, but improved from prior. 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 Doubt  UTI.  Nursing Notes Reviewed/ Care Coordinated Applicable Imaging Reviewed Interpretation of Laboratory Data incorporated into ED treatment  The patient appears reasonably screened and/or stabilized for discharge and I doubt any other medical condition or other [facility] requiring further screening, evaluation, or treatment in the ED at this time prior to discharge.  Plan: Home Medications- Percocet, Flomax; Home Treatments- strain urine; return here if the recommended treatment, does not improve the symptoms; Recommended follow up- urology 5-7 days.     Daleen Bo, MD 08/19/14 2037

## 2014-08-19 NOTE — Discharge Instructions (Signed)
Kidney Stones °Kidney stones (urolithiasis) are deposits that form inside your kidneys. The intense pain is caused by the stone moving through the urinary tract. When the stone moves, the ureter goes into spasm around the stone. The stone is usually passed in the urine.  °CAUSES  °· A disorder that makes certain neck glands produce too much parathyroid hormone (primary hyperparathyroidism). °· A buildup of uric acid crystals, similar to gout in your joints. °· Narrowing (stricture) of the ureter. °· A kidney obstruction present at birth (congenital obstruction). °· Previous surgery on the kidney or ureters. °· Numerous kidney infections. °SYMPTOMS  °· Feeling sick to your stomach (nauseous). °· Throwing up (vomiting). °· Blood in the urine (hematuria). °· Pain that usually spreads (radiates) to the groin. °· Frequency or urgency of urination. °DIAGNOSIS  °· Taking a history and physical exam. °· Blood or urine tests. °· CT scan. °· Occasionally, an examination of the inside of the urinary bladder (cystoscopy) is performed. °TREATMENT  °· Observation. °· Increasing your fluid intake. °· Extracorporeal shock wave lithotripsy--This is a noninvasive procedure that uses shock waves to break up kidney stones. °· Surgery may be needed if you have severe pain or persistent obstruction. There are various surgical procedures. Most of the procedures are performed with the use of small instruments. Only small incisions are needed to accommodate these instruments, so recovery time is minimized. °The size, location, and chemical composition are all important variables that will determine the proper choice of action for you. Talk to your health care provider to better understand your situation so that you will minimize the risk of injury to yourself and your kidney.  °HOME CARE INSTRUCTIONS  °· Drink enough water and fluids to keep your urine clear or pale yellow. This will help you to pass the stone or stone fragments. °· Strain  all urine through the provided strainer. Keep all particulate matter and stones for your health care provider to see. The stone causing the pain may be as small as a grain of salt. It is very important to use the strainer each and every time you pass your urine. The collection of your stone will allow your health care provider to analyze it and verify that a stone has actually passed. The stone analysis will often identify what you can do to reduce the incidence of recurrences. °· Only take over-the-counter or prescription medicines for pain, discomfort, or fever as directed by your health care provider. °· Make a follow-up appointment with your health care provider as directed. °· Get follow-up X-rays if required. The absence of pain does not always mean that the stone has passed. It may have only stopped moving. If the urine remains completely obstructed, it can cause loss of kidney function or even complete destruction of the kidney. It is your responsibility to make sure X-rays and follow-ups are completed. Ultrasounds of the kidney can show blockages and the status of the kidney. Ultrasounds are not associated with any radiation and can be performed easily in a matter of minutes. °SEEK MEDICAL CARE IF: °· You experience pain that is progressive and unresponsive to any pain medicine you have been prescribed. °SEEK IMMEDIATE MEDICAL CARE IF:  °· Pain cannot be controlled with the prescribed medicine. °· You have a fever or shaking chills. °· The severity or intensity of pain increases over 18 hours and is not relieved by pain medicine. °· You develop a new onset of abdominal pain. °· You feel faint or pass out. °·   You are unable to urinate. MAKE SURE YOU:   Understand these instructions.  Will watch your condition.  Will get help right away if you are not doing well or get worse. Document Released: 04/02/2005 Document Revised: 12/03/2012 Document Reviewed: 09/03/2012 [facility] Patient Information 2015  [place], [place]. This information is not intended to replace advice given to you by your health care provider. Make sure you discuss any questions you have with your health care provider.  Ureteral Colic (Kidney Stones) Ureteral colic is the result of a condition when kidney stones form inside the kidney. Once kidney stones are formed they may move into the tube that connects the kidney with the bladder (ureter). If this occurs, this condition may cause pain (colic) in the ureter.  CAUSES  Pain is caused by stone movement in the ureter and the obstruction caused by the stone. SYMPTOMS  The pain comes and goes as the ureter contracts around the stone. The pain is usually intense, sharp, and stabbing in character. The location of the pain may move as the stone moves through the ureter. When the stone is near the kidney the pain is usually located in the back and radiates to the belly (abdomen). When the stone is ready to pass into the bladder the pain is often located in the lower abdomen on the side the stone is located. At this location, the symptoms may mimic those of a urinary tract infection with urinary frequency. Once the stone is located here it often passes into the bladder and the pain disappears completely. TREATMENT   Your caregiver will provide you with medicine for pain relief.  You may require specialized follow-up X-rays.  The absence of pain does not always mean that the stone has passed. It may have just stopped moving. If the urine remains completely obstructed, it can cause loss of kidney function or even complete destruction of the involved kidney. It is your responsibility and in your interest that X-rays and follow-ups as suggested by your caregiver are completed. Relief of pain without passage of the stone can be associated with severe damage to the kidney, including loss of kidney function on that side.  If your stone does not pass on its own, additional measures may be taken by  your caregiver to ensure its removal. HOME CARE INSTRUCTIONS   Increase your fluid intake. Water is the preferred fluid since juices containing vitamin C may acidify the urine making it less likely for certain stones (uric acid stones) to pass.  Strain all urine. A strainer will be provided. Keep all particulate matter or stones for your caregiver to inspect.  Take your pain medicine as directed.  Make a follow-up appointment with your caregiver as directed.  Remember that the goal is passage of your stone. The absence of pain does not mean the stone is gone. Follow your caregiver's instructions.  Only take over-the-counter or prescription medicines for pain, discomfort, or fever as directed by your caregiver. SEEK MEDICAL CARE IF:   Pain cannot be controlled with the prescribed medicine.  You have a fever.  Pain continues for longer than your caregiver advises it should.  There is a change in the pain, and you develop chest discomfort or constant abdominal pain.  You feel faint or pass out. MAKE SURE YOU:   Understand these instructions.  Will watch your condition.  Will get help right away if you are not doing well or get worse. Document Released: 01/10/2005 Document Revised: 07/28/2012 Document Reviewed: 09/27/2010 [employer]  Patient Information 2015 [place]. This information is not intended to replace advice given to you by your health care provider. Make sure you discuss any questions you have with your health care provider.

## 2014-08-19 NOTE — ED Notes (Signed)
Pt st's he has a kidney stone on left side.  St's he started having left flank and LLQ pain approx 45 mins ago.  Nausea without vomiting.  Pt only has one kidney

## 2014-08-19 NOTE — ED Notes (Signed)
Pt returned from CT °

## 2014-12-04 ENCOUNTER — Emergency Department (HOSPITAL_COMMUNITY)
Admission: EM | Admit: 2014-12-04 | Discharge: 2014-12-05 | Disposition: A | Discharge status: Home / Self Care | Payer: Self-pay | Attending: Emergency Medicine | Admitting: Emergency Medicine

## 2014-12-04 ENCOUNTER — Emergency Department (HOSPITAL_COMMUNITY): Payer: Self-pay

## 2014-12-04 ENCOUNTER — Encounter (HOSPITAL_COMMUNITY): Payer: Self-pay

## 2014-12-04 DIAGNOSIS — Z87442 Personal history of urinary calculi: Secondary | ICD-10-CM | POA: Insufficient documentation

## 2014-12-04 DIAGNOSIS — Z7952 Long term (current) use of systemic steroids: Secondary | ICD-10-CM | POA: Insufficient documentation

## 2014-12-04 DIAGNOSIS — Z8679 Personal history of other diseases of the circulatory system: Secondary | ICD-10-CM | POA: Insufficient documentation

## 2014-12-04 DIAGNOSIS — M5136 Other intervertebral disc degeneration, lumbar region: Secondary | ICD-10-CM

## 2014-12-04 DIAGNOSIS — M5431 Sciatica, right side: Secondary | ICD-10-CM | POA: Insufficient documentation

## 2014-12-04 DIAGNOSIS — R269 Unspecified abnormalities of gait and mobility: Secondary | ICD-10-CM | POA: Insufficient documentation

## 2014-12-04 DIAGNOSIS — Z79899 Other long term (current) drug therapy: Secondary | ICD-10-CM | POA: Insufficient documentation

## 2014-12-04 DIAGNOSIS — Z791 Long term (current) use of non-steroidal anti-inflammatories (NSAID): Secondary | ICD-10-CM | POA: Insufficient documentation

## 2014-12-04 DIAGNOSIS — G8929 Other chronic pain: Secondary | ICD-10-CM | POA: Insufficient documentation

## 2014-12-04 DIAGNOSIS — Z87448 Personal history of other diseases of urinary system: Secondary | ICD-10-CM | POA: Insufficient documentation

## 2014-12-04 DIAGNOSIS — R200 Anesthesia of skin: Secondary | ICD-10-CM | POA: Insufficient documentation

## 2014-12-04 DIAGNOSIS — M5126 Other intervertebral disc displacement, lumbar region: Secondary | ICD-10-CM

## 2014-12-04 NOTE — ED Provider Notes (Signed)
CSN: [id number]     Arrival date & time 12/04/14  1932 History  This chart was scribed for non-physician practitioner, Margarita Mail, PA-C working with Charlesetta Shanks, MD, by Erling Conte, ED Scribe. This patient was seen in room TR07C/TR07C and the patient's care was started at 9:50 PM.    Chief Complaint  Patient presents with  . Ankle Pain    The history is provided by the patient. No language interpreter was used.    HPI Comments: Kyle Berry is a 44 y.o. male who presents to the Emergency Department complaining of constant, right foot numbness, on the lateral side, onset 6 days ago. He states the numbness is radiating throughout his entire foot. Pt notes associated "dull" right low back pain. He also notes associated gait issue due to the numbness in his right foot. Pt said his back pain chronic issue but has typically occurred intermittently until the past 2 weeks which it has been constant. He denies any known injury to his foot or back. He states the back pain shoots down into his right leg. He reports the back pain is exacerbated by movement and ambulation. Pt notes he has a h/o pinched nerve and had foot drop but states this feels differently. He states that it was not treated surgically. He notes that he only has 1 kidney. He notes he is allergic to prednisone. Pt denies any recent vaccinations. He denies any leg weakness, urinary or bowel incontinence, or saddles paresthesia.   Past Medical History  Diagnosis Date  . Renal disorder   . Kidney calculi   . Chronic back pain   . Migraines    History reviewed. No pertinent past surgical history. No family history on file. Social History  Substance Use Topics  . Smoking status: Never Smoker   . Smokeless tobacco: None  . Alcohol Use: Yes     Comment: 40 oz a day of beer    Review of Systems  Gastrointestinal:       Negative for bowel incontinence  Genitourinary:       Negative for urinary incontinence or saddle  paresthesia  Musculoskeletal: Positive for back pain and gait problem.  Neurological: Positive for numbness (R foot).      Allergies  Diclofenac and Prednisone  Home Medications   Prior to Admission medications   Medication Sig Start Date End Date Taking? Authorizing Provider  acetaminophen (TYLENOL) 500 MG tablet Take 1,000 mg by mouth every 6 (six) hours as needed for headache.    Historical Provider, MD  cephALEXin (KEFLEX) 500 MG capsule Take 1 capsule (500 mg total) by mouth 4 (four) times daily. Patient not taking: Reported on 08/19/2014 02/23/14   Carlisle Cater, PA-C  naproxen (NAPROSYN) 375 MG tablet Take 1 tablet (375 mg total) by mouth 2 (two) times daily. 12/05/14   Margarita Mail, PA-C  omeprazole (PRILOSEC) 20 MG capsule Take 20 mg by mouth daily as needed (heart burn).    Historical Provider, MD  oxyCODONE (OXY IR/ROXICODONE) 5 MG immediate release tablet Take 0.5-1 tablets (2.5-5 mg total) by mouth every 4 (four) hours as needed for severe pain. 12/05/14   Margarita Mail, PA-C  predniSONE (DELTASONE) 20 MG tablet 3 tabs po daily x 3 days, then 2 tabs x 3 days, then 1.5 tabs x 3 days, then 1 tab x 3 days, then 0.5 tabs x 3 days 12/05/14   Margarita Mail, PA-C  promethazine (PHENERGAN) 25 MG tablet Take 1 tablet (25 mg total) by  mouth every 6 (six) hours as needed for nausea or vomiting. Patient not taking: Reported on 02/23/2014 12/29/13   Jarrett Soho Muthersbaugh, PA-C  tamsulosin (FLOMAX) 0.4 MG CAPS capsule 1 q HS to aid stone passage 08/19/14   Daleen Bo, MD  traMADol (ULTRAM) 50 MG tablet Take 50 mg by mouth every 6 (six) hours as needed for moderate pain.    Historical Provider, MD   Triage Vitals: BP 127/99 mmHg  Pulse 92  Temp(Src) 98.5 F (36.9 C) (Oral)  Resp 16  Ht 6\' 1"  (1.854 m)  Wt 195 lb (88.451 kg)  BMI 25.73 kg/m2  SpO2 95%  Physical Exam  Constitutional: He is oriented to person, place, and time. He appears well-developed and well-nourished. No distress.   HENT:  Head: Normocephalic and atraumatic.  Eyes: Conjunctivae and EOM are normal.  Neck: Neck supple. No tracheal deviation present.  Cardiovascular: Normal rate.   Pulmonary/Chest: Effort normal. No respiratory distress.  Musculoskeletal: Normal range of motion.  Neurological: He is alert and oriented to person, place, and time.  Skin: Skin is warm and dry.  Psychiatric: He has a normal mood and affect. His behavior is normal.  Nursing note and vitals reviewed.   ED Course  Procedures (including critical care time)  DIAGNOSTIC STUDIES: Oxygen Saturation is 95% on RA, normal by my interpretation.    COORDINATION OF CARE:    Labs Review Labs Reviewed - No data to display  Imaging Review Mr Lumbar Spine Wo Contrast  12/04/2014   CLINICAL DATA:  Constant RIGHT foot numbness, lateral aspect for 6 days. History of intermittent chronic back pain, constant for last 2 weeks. History of pinched nerve and foot drop, different symptoms than today.  EXAM: MRI LUMBAR SPINE WITHOUT CONTRAST  TECHNIQUE: Multiplanar, multisequence MR imaging of the lumbar spine was performed. No intravenous contrast was administered.  COMPARISON:  CT abdomen pelvis Aug 19, 2014  FINDINGS: Lumbar vertebral bodies and posterior elements are intact and aligned. Straightened lumbar lordosis. Using the reference level of the last well-formed intervertebral disc as L5-S1, mild L5-S1 disc height loss, decreased T2 signal within this disc consistent with mild desiccation. Mild subacute to chronic discogenic endplate change at T2-W5, acute component toward the RIGHT at L5-S1.  Conus medullaris terminates at L1 appears normal morphology and signal characteristics. Cauda equina is normal. Included prevertebral and paraspinal soft tissues are normal.  Level by level evaluation:  L1-2, L2-3, L3-4: No disc bulge, canal stenosis nor neural foraminal narrowing.  L4-5: Annular bulging. No canal stenosis or neural foraminal narrowing.   L5-S1: 4 mm broad-based RIGHT central disc protrusion and annular fissure contacting the traversing RIGHT S1 nerve within the lateral recess, axial 30/33. No canal stenosis. Minimal RIGHT neural foraminal narrowing.  IMPRESSION: Small to moderate L5-S1 RIGHT central disc protrusion and annular fissure contacting the traversing RIGHT S1 nerve.  No acute fracture or malalignment. No canal stenosis. Minimal RIGHT L5-S1 neural foraminal narrowing.   Electronically Signed   By: Elon Alas M.D.   On: 12/04/2014 23:23   I have personally reviewed and evaluated these images and lab results as part of my medical decision-making.   EKG Interpretation None      MDM   Final diagnoses:  Bulging lumbar disc  Numbness of foot  Sciatica, right    Patient with numbness of the foot. Apparent wekaness with dorsiflexion. Patient able to toe walk but cannot easily heal walk. Equal DTrs. 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 I hae spoken with Dr. Cyndy Freeze of Neurosurgery- we reviewed the films. He feels the herniation is non operative but that the sxs are not likely a result of his disc.  I have spoke with Dr. Aram Beecham of neurology who feels that this is likely a peroneal nerve compression and recommends OP EMG> Discussed all findings witht he patient and his family. Discussed reasons to seek immediate medical care. Treat with NSAIDS/ Pain meds  F/u with OP neurology. Patient is ambulatory without gait instability   I personally performed the services described in this documentation, which was scribed in my presence. The recorded information has been reviewed and is accurate.       Margarita Mail, PA-C 12/05/14 1200  Charlesetta Shanks, MD 12/10/14 [id number]

## 2014-12-04 NOTE — ED Notes (Signed)
Pt coming back to room 48 from MRI

## 2014-12-04 NOTE — ED Notes (Signed)
Pt states that he started feeling numbness to his right side of his foot on Sunday 11/28/2014 and states that today his entire right foot is numb. Pt denies any injury or fall

## 2014-12-04 NOTE — ED Notes (Signed)
Pt states he noticed his right ankle was feeling numb and hurting two weeks ago but thought it was from his tight socks he was wearing so he stopped wearing them. Today got out of the shower and noticed that he can't feel anything in it. States his right lower back is also hurting.

## 2014-12-05 MED ORDER — NAPROXEN 375 MG PO TABS: 375.0000 mg | ORAL_TABLET | Freq: Two times a day (BID) | ORAL | Status: DC

## 2014-12-05 MED ORDER — PREDNISONE 20 MG PO TABS: ORAL_TABLET | ORAL | Status: DC

## 2014-12-05 MED ORDER — OXYCODONE HCL 5 MG PO TABS
2.5000 mg | ORAL_TABLET | ORAL | Status: DC | PRN
Start: 2014-12-05 — End: 2015-04-26

## 2014-12-05 NOTE — Discharge Instructions (Signed)
SEEK IMMEDIATE MEDICAL ATTENTION IF: New numbness, tingling, weakness, or problem with the use of your arms or legs.  Severe back pain not relieved with medications.  Change in bowel or bladder control.  Increasing pain in any areas of the body (such as chest or abdominal pain).  Shortness of breath, dizziness or fainting.  Nausea (feeling sick to your stomach), vomiting, fever, or sweats.  Sciatica with Rehab The sciatic nerve runs from the back down the leg and is responsible for sensation and control of the muscles in the back (posterior) side of the thigh, lower leg, and foot. Sciatica is a condition that is characterized by inflammation of this nerve.  SYMPTOMS   Signs of nerve damage, including numbness and/or weakness along the posterior side of the lower extremity.  Pain in the back of the thigh that may also travel down the leg.  Pain that worsens when sitting for long periods of time.  Occasionally, pain in the back or buttock. CAUSES  Inflammation of the sciatic nerve is the cause of sciatica. The inflammation is due to something irritating the nerve. Common sources of irritation include:  Sitting for long periods of time.  Direct trauma to the nerve.  Arthritis of the spine.  Herniated or ruptured disk.  Slipping of the vertebrae (spondylolisthesis).  Pressure from soft tissues, such as muscles or ligament-like tissue (fascia). RISK INCREASES WITH:  Sports that place pressure or stress on the spine (football or weightlifting).  Poor strength and flexibility.  Failure to warm up properly before activity.  Family history of low back pain or disk disorders.  Previous back injury or surgery.  Poor body mechanics, especially when lifting, or poor posture. PREVENTION   Warm up and stretch properly before activity.  Maintain physical fitness:  Strength, flexibility, and endurance.  Cardiovascular fitness.  Learn and use proper technique, especially with  posture and lifting. When possible, have coach correct improper technique.  Avoid activities that place stress on the spine. PROGNOSIS If treated properly, then sciatica usually resolves within 6 weeks. However, occasionally surgery is necessary.  RELATED COMPLICATIONS   Permanent nerve damage, including pain, numbness, tingle, or weakness.  Chronic back pain.  Risks of surgery: infection, bleeding, nerve damage, or damage to surrounding tissues. TREATMENT Treatment initially involves resting from any activities that aggravate your symptoms. The use of ice and medication may help reduce pain and inflammation. The use of strengthening and stretching exercises may help reduce pain with activity. These exercises may be performed at home or with referral to a therapist. A therapist may recommend further treatments, such as transcutaneous electronic nerve stimulation (TENS) or ultrasound. Your caregiver may recommend corticosteroid injections to help reduce inflammation of the sciatic nerve. If symptoms persist despite non-surgical (conservative) treatment, then surgery may be recommended. MEDICATION  If pain medication is necessary, then nonsteroidal anti-inflammatory medications, such as aspirin and ibuprofen, or other minor pain relievers, such as acetaminophen, are often recommended.  Do not take pain medication for 7 days before surgery.  Prescription pain relievers may be given if deemed necessary by your caregiver. Use only as directed and only as much as you need.  Ointments applied to the skin may be helpful.  Corticosteroid injections may be given by your caregiver. These injections should be reserved for the most serious cases, because they may only be given a certain number of times. HEAT AND COLD  Cold treatment (icing) relieves pain and reduces inflammation. Cold treatment should be applied for 10 to  15 minutes every 2 to 3 hours for inflammation and pain and immediately after any  activity that aggravates your symptoms. Use ice packs or massage the area with a piece of ice (ice massage).  Heat treatment may be used prior to performing the stretching and strengthening activities prescribed by your caregiver, physical therapist, or athletic trainer. Use a heat pack or soak the injury in warm water. SEEK MEDICAL CARE IF:  Treatment seems to offer no benefit, or the condition worsens.  Any medications produce adverse side effects. EXERCISES  RANGE OF MOTION (ROM) AND STRETCHING EXERCISES - Sciatica Most people with sciatic will find that their symptoms worsen with either excessive bending forward (flexion) or arching at the low back (extension). The exercises which will help resolve your symptoms will focus on the opposite motion. Your physician, physical therapist or athletic trainer will help you determine which exercises will be most helpful to resolve your low back pain. Do not complete any exercises without first consulting with your clinician. Discontinue any exercises which worsen your symptoms until you speak to your clinician. If you have pain, numbness or tingling which travels down into your buttocks, leg or foot, the goal of the therapy is for these symptoms to move closer to your back and eventually resolve. Occasionally, these leg symptoms will get better, but your low back pain may worsen; this is typically an indication of progress in your rehabilitation. Be certain to be very alert to any changes in your symptoms and the activities in which you participated in the 24 hours prior to the change. Sharing this information with your clinician will allow him/her to most efficiently treat your condition. These exercises may help you when beginning to rehabilitate your injury. Your symptoms may resolve with or without further involvement from your physician, physical therapist or athletic trainer. While completing these exercises, remember:   Restoring tissue flexibility  helps normal motion to return to the joints. This allows healthier, less painful movement and activity.  An effective stretch should be held for at least 30 seconds.  A stretch should never be painful. You should only feel a gentle lengthening or release in the stretched tissue. FLEXION RANGE OF MOTION AND STRETCHING EXERCISES: STRETCH - Flexion, Single Knee to Chest   Lie on a firm bed or floor with both legs extended in front of you.  Keeping one leg in contact with the floor, bring your opposite knee to your chest. Hold your leg in place by either grabbing behind your thigh or at your knee.  Pull until you feel a gentle stretch in your low back. Hold __________ seconds.  Slowly release your grasp and repeat the exercise with the opposite side. Repeat __________ times. Complete this exercise __________ times per day.  STRETCH - Flexion, Double Knee to Chest  Lie on a firm bed or floor with both legs extended in front of you.  Keeping one leg in contact with the floor, bring your opposite knee to your chest.  Tense your stomach muscles to support your back and then lift your other knee to your chest. Hold your legs in place by either grabbing behind your thighs or at your knees.  Pull both knees toward your chest until you feel a gentle stretch in your low back. Hold __________ seconds.  Tense your stomach muscles and slowly return one leg at a time to the floor. Repeat __________ times. Complete this exercise __________ times per day.  STRETCH - Low Trunk Rotation  Lie on a firm bed or floor. Keeping your legs in front of you, bend your knees so they are both pointed toward the ceiling and your feet are flat on the floor.  Extend your arms out to the side. This will stabilize your upper body by keeping your shoulders in contact with the floor.  Gently and slowly drop both knees together to one side until you feel a gentle stretch in your low back. Hold for __________  seconds.  Tense your stomach muscles to support your low back as you bring your knees back to the starting position. Repeat the exercise to the other side. Repeat __________ times. Complete this exercise __________ times per day  EXTENSION RANGE OF MOTION AND FLEXIBILITY EXERCISES: STRETCH - Extension, Prone on Elbows  Lie on your stomach on the floor, a bed will be too soft. Place your palms about shoulder width apart and at the height of your head.  Place your elbows under your shoulders. If this is too painful, stack pillows under your chest.  Allow your body to relax so that your hips drop lower and make contact more completely with the floor.  Hold this position for __________ seconds.  Slowly return to lying flat on the floor. Repeat __________ times. Complete this exercise __________ times per day.  RANGE OF MOTION - Extension, Prone Press Ups  Lie on your stomach on the floor, a bed will be too soft. Place your palms about shoulder width apart and at the height of your head.  Keeping your back as relaxed as possible, slowly straighten your elbows while keeping your hips on the floor. You may adjust the placement of your hands to maximize your comfort. As you gain motion, your hands will come more underneath your shoulders.  Hold this position __________ seconds.  Slowly return to lying flat on the floor. Repeat __________ times. Complete this exercise __________ times per day.  STRENGTHENING EXERCISES - Sciatica  These exercises may help you when beginning to rehabilitate your injury. These exercises should be done near your "sweet spot." This is the neutral, low-back arch, somewhere between fully rounded and fully arched, that is your least painful position. When performed in this safe range of motion, these exercises can be used for people who have either a flexion or extension based injury. These exercises may resolve your symptoms with or without further involvement from your  physician, physical therapist or athletic trainer. While completing these exercises, remember:   Muscles can gain both the endurance and the strength needed for everyday activities through controlled exercises.  Complete these exercises as instructed by your physician, physical therapist or athletic trainer. Progress with the resistance and repetition exercises only as your caregiver advises.  You may experience muscle soreness or fatigue, but the pain or discomfort you are trying to eliminate should never worsen during these exercises. If this pain does worsen, stop and make certain you are following the directions exactly. If the pain is still present after adjustments, discontinue the exercise until you can discuss the trouble with your clinician. STRENGTHENING - Deep Abdominals, Pelvic Tilt   Lie on a firm bed or floor. Keeping your legs in front of you, bend your knees so they are both pointed toward the ceiling and your feet are flat on the floor.  Tense your lower abdominal muscles to press your low back into the floor. This motion will rotate your pelvis so that your tail bone is scooping upwards rather than pointing at your  feet or into the floor.  With a gentle tension and even breathing, hold this position for __________ seconds. Repeat __________ times. Complete this exercise __________ times per day.  STRENGTHENING - Abdominals, Crunches   Lie on a firm bed or floor. Keeping your legs in front of you, bend your knees so they are both pointed toward the ceiling and your feet are flat on the floor. Cross your arms over your chest.  Slightly tip your chin down without bending your neck.  Tense your abdominals and slowly lift your trunk high enough to just clear your shoulder blades. Lifting higher can put excessive stress on the low back and does not further strengthen your abdominal muscles.  Control your return to the starting position. Repeat __________ times. Complete this  exercise __________ times per day.  STRENGTHENING - Quadruped, Opposite UE/LE Lift  Assume a hands and knees position on a firm surface. Keep your hands under your shoulders and your knees under your hips. You may place padding under your knees for comfort.  Find your neutral spine and gently tense your abdominal muscles so that you can maintain this position. Your shoulders and hips should form a rectangle that is parallel with the floor and is not twisted.  Keeping your trunk steady, lift your right hand no higher than your shoulder and then your left leg no higher than your hip. Make sure you are not holding your breath. Hold this position __________ seconds.  Continuing to keep your abdominal muscles tense and your back steady, slowly return to your starting position. Repeat with the opposite arm and leg. Repeat __________ times. Complete this exercise __________ times per day.  STRENGTHENING - Abdominals and Quadriceps, Straight Leg Raise   Lie on a firm bed or floor with both legs extended in front of you.  Keeping one leg in contact with the floor, bend the other knee so that your foot can rest flat on the floor.  Find your neutral spine, and tense your abdominal muscles to maintain your spinal position throughout the exercise.  Slowly lift your straight leg off the floor about 6 inches for a count of 15, making sure to not hold your breath.  Still keeping your neutral spine, slowly lower your leg all the way to the floor. Repeat this exercise with each leg __________ times. Complete this exercise __________ times per day. POSTURE AND BODY MECHANICS CONSIDERATIONS - Sciatica Keeping correct posture when sitting, standing or completing your activities will reduce the stress put on different body tissues, allowing injured tissues a chance to heal and limiting painful experiences. The following are general guidelines for improved posture. Your physician or physical therapist will provide  you with any instructions specific to your needs. While reading these guidelines, remember:  The exercises prescribed by your provider will help you have the flexibility and strength to maintain correct postures.  The correct posture provides the optimal environment for your joints to work. All of your joints have less wear and tear when properly supported by a spine with good posture. This means you will experience a healthier, less painful body.  Correct posture must be practiced with all of your activities, especially prolonged sitting and standing. Correct posture is as important when doing repetitive low-stress activities (typing) as it is when doing a single heavy-load activity (lifting). RESTING POSITIONS Consider which positions are most painful for you when choosing a resting position. If you have pain with flexion-based activities (sitting, bending, stooping, squatting), choose a position  that allows you to rest in a less flexed posture. You would want to avoid curling into a fetal position on your side. If your pain worsens with extension-based activities (prolonged standing, working overhead), avoid resting in an extended position such as sleeping on your stomach. Most people will find more comfort when they rest with their spine in a more neutral position, neither too rounded nor too arched. Lying on a non-sagging bed on your side with a pillow between your knees, or on your back with a pillow under your knees will often provide some relief. Keep in mind, being in any one position for a prolonged period of time, no matter how correct your posture, can still lead to stiffness. PROPER SITTING POSTURE In order to minimize stress and discomfort on your spine, you must sit with correct posture Sitting with good posture should be effortless for a healthy body. Returning to good posture is a gradual process. Many people can work toward this most comfortably by using various supports until they have  the flexibility and strength to maintain this posture on their own. When sitting with proper posture, your ears will fall over your shoulders and your shoulders will fall over your hips. You should use the back of the chair to support your upper back. Your low back will be in a neutral position, just slightly arched. You may place a small pillow or folded towel at the base of your low back for support.  When working at a desk, create an environment that supports good, upright posture. Without extra support, muscles fatigue and lead to excessive strain on joints and other tissues. Keep these recommendations in mind: CHAIR:   A chair should be able to slide under your desk when your back makes contact with the back of the chair. This allows you to work closely.  The chair's height should allow your eyes to be level with the upper part of your monitor and your hands to be slightly lower than your elbows. BODY POSITION  Your feet should make contact with the floor. If this is not possible, use a foot rest.  Keep your ears over your shoulders. This will reduce stress on your neck and low back. INCORRECT SITTING POSTURES   If you are feeling tired and unable to assume a healthy sitting posture, do not slouch or slump. This puts excessive strain on your back tissues, causing more damage and pain. Healthier options include:  Using more support, like a lumbar pillow.  Switching tasks to something that requires you to be upright or walking.  Talking a brief walk.  Lying down to rest in a neutral-spine position. PROLONGED STANDING WHILE SLIGHTLY LEANING FORWARD  When completing a task that requires you to lean forward while standing in one place for a long time, place either foot up on a stationary 2-4 inch high object to help maintain the best posture. When both feet are on the ground, the low back tends to lose its slight inward curve. If this curve flattens (or becomes too large), then the back and  your other joints will experience too much stress, fatigue more quickly and can cause pain.  CORRECT STANDING POSTURES Proper standing posture should be assumed with all daily activities, even if they only take a few moments, like when brushing your teeth. As in sitting, your ears should fall over your shoulders and your shoulders should fall over your hips. You should keep a slight tension in your abdominal muscles to brace your  spine. Your tailbone should point down to the ground, not behind your body, resulting in an over-extended swayback posture.  INCORRECT STANDING POSTURES  Common incorrect standing postures include a forward head, locked knees and/or an excessive swayback. WALKING Walk with an upright posture. Your ears, shoulders and hips should all line-up. PROLONGED ACTIVITY IN A FLEXED POSITION When completing a task that requires you to bend forward at your waist or lean over a low surface, try to find a way to stabilize 3 of 4 of your limbs. You can place a hand or elbow on your thigh or rest a knee on the surface you are reaching across. This will provide you more stability so that your muscles do not fatigue as quickly. By keeping your knees relaxed, or slightly bent, you will also reduce stress across your low back. CORRECT LIFTING TECHNIQUES DO :   Assume a wide stance. This will provide you more stability and the opportunity to get as close as possible to the object which you are lifting.  Tense your abdominals to brace your spine; then bend at the knees and hips. Keeping your back locked in a neutral-spine position, lift using your leg muscles. Lift with your legs, keeping your back straight.  Test the weight of unknown objects before attempting to lift them.  Try to keep your elbows locked down at your sides in order get the best strength from your shoulders when carrying an object.  Always ask for help when lifting heavy or awkward objects. INCORRECT LIFTING TECHNIQUES DO  NOT:   Lock your knees when lifting, even if it is a small object.  Bend and twist. Pivot at your feet or move your feet when needing to change directions.  Assume that you cannot safely pick up a paperclip without proper posture. Document Released: 04/02/2005 Document Revised: 08/17/2013 Document Reviewed: 07/15/2008 [facility] Patient Information 2015 [place], [place]. This information is not intended to replace advice given to you by your health care provider. Make sure you discuss any questions you have with your health care provider.  Herniated Disk  with Rehab Between each vertebrae of the spine exists a disk. These disks contain a jelly-like material that helps cushion the spinal column. Occasionally, damage to the supportive ligaments of the vertebrae causes a disk to shift from its normal alignment and place pressure on surrounding structures, such as the spinal cord. This is called a herniated (ruptured) disk. SYMPTOMS   Pain in the back, that often affects one side.  Pain that gets worse with movement, sneezing, coughing, or straining.  Muscle spasms in the back.  Pain, numbness, or weakness affecting one arm or leg (depending on whether injury is in the neck or low back).  Muscle loss (if the condition has become chronic).  Loss of stool (bowel) or urine (bladder) function. CAUSES  Herniated disks result when a disk becomes weak. The disk eventually ruptures and places pressure on the spinal cord. Herniated disks may occur from sudden injury (acute trauma) such as heavy labor, or from ongoing (chronic) stress, such as obesity.  RISK INCREASES WITH:  Sports that involve downward or twisting pressure on the neck or spine (football, weightlifting, horseback riding competition, bowling, tennis, jogging, track, racquetball, gymnastics).  Poor strength and flexibility.  Failure to warm up properly before activity.  Family history of low back pain or disk disorders.  Previous  back surgery (especially fusion).  Preexisting forward displacement of a vertebra (spondylolisthesis).  Poor technique when lifting.  Prolonged sitting,  especially with poor posture. PREVENTION  Learn and use proper technique when sitting or lifting.  Warm up and stretch properly before activity.  Maintain physical fitness:  Strength, flexibility, and endurance.  Cardiovascular fitness.  Maintain a healthy body weight.  If previously injured, avoid any intense physical activity that requires twisting of the body under uncontrollable conditions. PROGNOSIS  If treated properly, herniated disks are usually curable within 6 weeks. Sometimes, surgery is required.  RELATED COMPLICATIONS   Permanent numbness, weakness, or paralysis and muscle loss.  Chronic back pain.  Loss of bowel or bladder function.  Decreased sexual function.  Risks of surgery: infection, bleeding, injury to nerves (persistent or increased numbness, weakness, or paralysis), persistent back pain, and spinal headache. TREATMENT Treatment first involves resting from any aggravating activities and the use of ice and medicine to reduce pain and inflammation. As muscle spasms begin to decrease, it is important to perform strengthening and stretching exercises of the back muscles. These will help teach and reinforce proper body posture. These exercises may be performed at home, or with a therapist. A therapist may complete a further evaluation and recommend additional treatments, such as ultrasound, traction (for herniated disks of the neck), a cervical collar (for herniated disks of the neck), or a corset or back brace (for herniated disks of the low back). Prolonged rest may do more harm than good. Your therapist will teach you proper techniques for performing simple activities, such as lifting an object off the floor or using proper posture while sitting. At night, it is advised that you sleep on your back, on a firm  mattress, and place a pillow under your knees. Your caregiver may recommend oral steroids or an injection of corticosteroids in the space around the spinal cord (epidural space) in order to reduce pain and inflammation. For severe cases, surgery is recommended. MEDICATION   If pain medicine is needed, nonsteroidal anti-inflammatory medicines (aspirin and ibuprofen), or other minor pain relievers (acetaminophen), are often advised.  Do not take pain medicine for 7 days before surgery.  Prescription pain relievers may be given if your caregiver thinks they are needed. Use only as directed and only as much as you need.  Ointments applied to the skin may be helpful.  Corticosteroid injections may be given. These injections should be reserved for the most serious cases, as they can only be given a certain number of times.  Oral steroids may be given to reduce inflammation, although not usually for severe (acute) injuries. HEAT AND COLD  Cold treatment (icing) relieves pain and reduces inflammation. Cold treatment should be applied for 10 to 15 minutes every 2 to 3 hours, and immediately after activity that aggravates your symptoms. Use ice packs or an ice massage.  Heat treatment may be used before performing stretching and strengthening activities prescribed by your caregiver, physical therapist, or athletic trainer. Use a heat pack or a warm water soak. SEEK MEDICAL CARE IF:   Symptoms get worse or do not improve in 2 to 4 weeks, despite treatment.  You develop loss of bowel or bladder function.  New, unexplained symptoms develop. (Drugs used in treatment may produce side effects.) EXERCISES  RANGE OF MOTION (ROM) AND STRETCHING EXERCISES - Herniated Disk (Ruptured Disk) Most people with low back pain will find that their symptoms get worse with excessive bending forward (flexion) or arching at the low back (extension). The exercises that will help resolve your symptoms will focus on the  opposite motion. Your physician, physical  therapist or athletic trainer will help you determine which exercises will be most helpful to resolve your low back pain. Do not complete any exercises without first consulting with your caregiver. Discontinue any exercises that make your symptoms worse, until you speak to your caregiver. If you have pain, numbness or tingling that travels down into your buttocks, leg or foot, the goal of this therapy is for these symptoms to move closer to your back and to eventually go away. Sometimes, these leg symptoms will get better, but your low back pain may get worse. This is typically an indication of progress in your rehabilitation. Be sure to be very alert to any changes in your symptoms and to the activities you have done in the 24 hours prior to the change. Sharing this information with your caregiver will allow him or her to best treat your condition. These exercises may help you when beginning to rehabilitate your injury. Your symptoms may go away with or without further involvement from your physician, physical therapist or athletic trainer. While completing these exercises, remember:   Restoring tissue flexibility helps normal motion to return to the joints. This allows healthier, less painful movement and activity.  An effective stretch should be held for at least 30 seconds.  A stretch should never be painful. You should only feel a gentle lengthening or release in the stretched tissue. FLEXION RANGE OF MOTION AND STRETCHING EXERCISES: STRETCH - Flexion, Single Knee to Chest  Lie on a firm bed or floor, with both legs extended in front of you.  Keeping one leg in contact with the floor, bring your opposite knee to your chest. Hold your leg in place by either grabbing behind your thigh or at your knee.  Pull until you feel a gentle stretch in your low back. Hold for __________ seconds.  Slowly release your grasp and repeat the exercise with the opposite  side. Repeat __________ times. Complete this exercise __________ times per day.  STRETCH - Flexion, Double Knee to Chest   Lie on a firm bed or floor, with both legs extended in front of you.  Keeping one leg in contact with the floor, bring your opposite knee to your chest.  Tense your stomach muscles to support your back and then lift your other knee to your chest. Hold your legs in place by either grabbing behind your thighs or at your knees.  Pull both knees toward your chest until you feel a gentle stretch in your low back. Hold for __________ seconds.  Tense your stomach muscles and slowly return one leg at a time to the floor. Repeat __________ times. Complete this exercise __________ times per day.  STRETCH - Low Trunk Rotation  Lie on a firm bed or floor. Keeping your legs in front of you, bend your knees so they are both pointed toward the ceiling and your feet are flat on the floor.  Extend your arms out to the side. This will stabilize your upper body by keeping your shoulders in contact with the floor.  Gently and slowly drop both knees together to one side, until you feel a gentle stretch in your low back. Hold for __________ seconds.  Tense your stomach muscles to support your low back as you bring your knees back to the starting position. Repeat the exercise while dropping both knees to the other side. Repeat __________ times. Complete this exercise __________ times per day  EXTENSION RANGE OF MOTION AND FLEXIBILITY EXERCISES: STRETCH - Extension, Prone on  Elbows   Lie on your stomach on the floor. (A bed will be too soft.) Place your palms about shoulder width apart.  Place your elbows under your shoulders. If this is too painful, stack pillows under your chest.  Allow your body to relax so that your hips drop lower and make contact more completely with the floor.  Hold this position for __________ seconds.  Slowly return to lying flat on the floor. Repeat  __________ times. Complete this exercise __________ times per day.  RANGE OF MOTION - Extension, Prone Press Ups   Lie on your stomach on the floor. (A bed will be too soft.) Place your palms about shoulder width apart and at the height of your head.  Keeping your back as relaxed as possible, slowly straighten your elbows while keeping your hips on the floor. You may adjust the placement of your hands to maximize your comfort. As you gain motion, your hands will come more underneath your shoulders.  Hold this position for __________ seconds.  Slowly return to lying flat on the floor. Repeat __________ times. Complete this exercise __________ times per day.  RANGE OF MOTION- Quadruped, Neutral Spine   Assume a hands and knees position on a firm surface. Keep your hands under your shoulders and your knees under your hips. You may place padding under your knees for comfort.  Drop your head and point your tail bone toward the ground below you. This will round out your low back like an angry cat. Hold this position for __________ seconds.  Slowly lift your head and release your tail bone so that your back sags into a large arch, like an old horse.  Hold this position for __________ seconds.  Repeat this until you feel limber in your low back.  Now, find your "sweet spot." This will be the most comfortable position somewhere between the two previous positions. This is your neutral spine. Once you have found this position, tense your stomach muscles to support your low back.  Hold this position for __________ seconds. Repeat __________ times. Complete this exercise __________ times per day.  STRENGTHENING EXERCISES - Herniated Disk (Ruptured Disk) These exercises may help you when beginning to rehabilitate your injury. These exercises should be done near your "sweet spot." This is the neutral, low-back arch, somewhere between fully rounded and fully arched, that is your least painful position.  When performed in this safe range of motion, these exercises can be used for people who have either a flexion or extension based injury. These exercises may resolve your symptoms with or without further involvement from your physician, physical therapist or athletic trainer. While completing these exercises, remember:   Muscles can gain both the endurance and the strength needed for everyday activities through controlled exercises.  Complete these exercises as instructed by your physician, physical therapist or athletic trainer. Increase the resistance and repetitions only as guided.  You may experience muscle soreness or fatigue, but the pain or discomfort you are trying to eliminate should never worsen during these exercises. If this pain does get worse, stop and make sure you are following the directions exactly. If the pain is still present after adjustments, discontinue the exercise until you can discuss the trouble with your clinician. STRENGTHENING - Deep Abdominals, Pelvic Tilt   Lie on a firm bed or floor. Keeping your legs in front of you, bend your knees so they are both pointed toward the ceiling and your feet are flat on the floor.  Tense your lower abdominal muscles to press your low back into the floor. This motion will rotate your pelvis so that your tail bone is scooping upwards rather than pointing at your feet or into the floor.  With a gentle tension and even breathing, hold this position for __________ seconds. Repeat __________ times. Complete this exercise __________ times per day.  STRENGTHENING - Abdominals, Crunches   Lie on a firm bed or floor. Keeping your legs in front of you, bend your knees so they are both pointed toward the ceiling and your feet are flat on the floor. Cross your arms over your chest.  Slightly tip your chin down without bending your neck.  Tense your abdominals and slowly lift your trunk high enough so that your shoulder blades are just off the  floor. Lifting higher can put too much stress on the low back and does not further strengthen your abdominal muscles.  With control, return to the starting position. Repeat __________ times. Complete this exercise __________ times per day.  STRENGTHENING - Quadruped, Opposite UE/LE Lift  Assume a hands and knees position on a firm surface. Keep your hands under your shoulders and your knees under your hips. You may place padding under your knees for comfort.  Find your neutral spine and gently tense your abdominal muscles so that you can maintain this position. Your shoulders and hips should form a rectangle that is parallel with the floor and is not twisted.  Keeping your trunk steady, lift your right hand no higher than your shoulder. Then lift your left leg no higher than your hip. Make sure you are not holding your breath. Hold this position for __________ seconds.  Continuing to keep your abdominal muscles tense and your back steady, slowly return to your starting position. Repeat with the opposite arm and leg. Repeat __________ times. Complete this exercise __________ times per day.  STRENGTHENING - Lower Abdominals, Double Knee Lift  Lie on a firm bed or floor. Keeping your legs in front of you, bend your knees so they are both pointed toward the ceiling and your feet are flat on the floor.  Tense your abdominal muscles to brace your low back and slowly lift both of your knees until they come over your hips. Be certain not to hold your breath.  Hold for __________ seconds. Using your abdominal muscles, return to the starting position in a slow and controlled manner. Repeat __________ times. Complete this exercise __________ times per day.  POSTURE AND BODY MECHANICS CONSIDERATIONS - Herniated Disc (Ruptured Disk) Keeping correct posture when sitting, standing or completing your activities will reduce the stress put on different body tissues, allowing injured tissues a chance to heal and  limiting painful experiences. The following are general guidelines for improved posture. Your physician or physical therapist will provide you with any instructions specific to your needs. While reading these guidelines, remember:  The exercises prescribed by your provider will help you build the flexibility and strength to maintain correct postures.  The correct posture provides the best environment for your joints to work. All of your joints have less wear and tear when properly supported by a spine with good posture. This means you will experience a healthier, less painful body.  Correct posture must be practiced with all of your activities, especially prolonged sitting and standing. Correct posture is as important when doing repetitive low-stress activities (typing) as it is when doing a single heavy-load activity (lifting). RESTING POSITIONS Consider which positions are most  painful for you when choosing a resting position. If you have pain with flexion-based activities (sitting, bending, stooping, squatting), choose a position that allows you to rest in a less flexed posture. You would want to avoid curling into a fetal position on your side. If your pain gets worse with extension-based activities (prolonged standing, working overhead), avoid resting in an extended position such as sleeping on your stomach. Most people will find more comfort when they rest with their spine in a more neutral position, neither too rounded nor too arched. Lying on a non-sagging bed on your side, with a pillow between your knees, or on your back with a pillow under your knees will often provide some relief. Keep in mind, being in any one position for a prolonged period of time, no matter how correct your posture, can still lead to stiffness. PROPER SITTING POSTURE In order to minimize stress and discomfort on your spine, you must sit with correct posture. Sitting with good posture should be effortless for a healthy body.  Returning to good posture is a gradual process. Many people can work toward this most comfortably by using various supports until they have the flexibility and strength to maintain this posture on their own. When sitting with proper posture, your ears will fall over your shoulders and your shoulders will fall over your hips. You should use the back of the chair to support your upper back. Your low back will be in a neutral position, just slightly arched. You may place a small pillow or folded towel at the base of your low back for support.  When working at a desk, create an environment that supports good, upright posture. Without extra support, muscles tire, which leads to excessive strain on joints and other tissues. Keep these recommendations in mind. CHAIR  A chair should be able to slide under your desk when your back makes contact with the back of the chair. This allows you to work closely.  The chair's height should allow your eyes to be level with the upper part of your monitor and your hands to be slightly lower than your elbows. BODY POSITION  Your feet should make contact with the floor. If this is not possible, use a foot rest.  Keep your ears over your shoulders. This will reduce stress on your neck and low back. INCORRECT SITTING POSTURES  If you are feeling tired and unable to assume a healthy sitting posture, do not slouch or slump. This puts excessive strain on your back tissues, causing more damage and pain. Healthier options include:  Using more support, like a lumbar pillow.  Switching tasks, to something that requires you to be upright or walking.  Talking a brief walk.  Lying down to rest in a neutral-spine position. PROLONGED STANDING WHILE SLIGHTLY LEANING FORWARD  When completing a task that requires you to lean forward while standing in one place for a long time, place either foot up on a stationary 2-4 inch high object, to help maintain the best posture. When both  feet are on the ground, the low back tends to lose its slight inward curve. If this curve flattens (or becomes too large), the back and your other joints will experience too much stress, tire more quickly and can cause pain. CORRECT STANDING POSTURES Proper standing posture should be assumed with all daily activities, even if they only take a few moments, like when brushing your teeth. As in sitting, your ears should fall over your shoulders and your  shoulders should fall over your hips. You should keep a slight tension in your abdominal muscles to brace your spine. Your tailbone should point down to the ground, not behind your body, resulting in an over-extended swayback posture.  INCORRECT STANDING POSTURES  Common incorrect standing postures include a forward head, locked knees or an excessive swayback. WALKING Walk with an upright posture. Your ears, shoulders and hips should all line-up. PROLONGED ACTIVITY IN A FLEXED POSITION When completing a task that requires you to bend forward at your waist or lean over a low surface, try finding a way to stabilize 3 out of 4 of your limbs. You can place a hand or elbow on your thigh, or rest a knee on the surface you are reaching across. This will provide you more stability so that your muscles do not tire as quickly. By keeping your knees relaxed, or slightly bent, you will also reduce stress across your low back. CORRECT LIFTING TECHNIQUES DO :   Assume a wide stance. This will provide you more stability and the opportunity to get as close as possible to the object you are lifting.  Tense your abdominals to brace your spine. Then, bend at the knees and hips. Keeping your back locked in a neutral-spine position, lift using your leg muscles. Lift with your legs, keeping your back straight.  Test the weight of unknown objects before attempting to lift them.  Try to keep your elbows down by your sides, in order get the best strength from your shoulders  when carrying an object.  Always ask for help when lifting heavy or awkward objects. INCORRECT LIFTING TECHNIQUES DO NOT:   Lock your knees when lifting, even if it is a small object.  Bend and twist. Pivot at your feet or move your feet when needing to change directions.  Assume that you can safely pick up even a paper clip, without proper posture. Document Released: 04/02/2005 Document Revised: 06/25/2011 Document Reviewed: 07/15/2008 [facility] Patient Information 2015 [place], [place]. This information is not intended to replace advice given to you by your health care provider. Make sure you discuss any questions you have with your health care provider.

## 2015-01-20 ENCOUNTER — Emergency Department (HOSPITAL_COMMUNITY): Payer: Self-pay

## 2015-01-20 ENCOUNTER — Encounter (HOSPITAL_COMMUNITY): Payer: Self-pay | Admitting: Emergency Medicine

## 2015-01-20 ENCOUNTER — Emergency Department (HOSPITAL_COMMUNITY)
Admission: EM | Admit: 2015-01-20 | Discharge: 2015-01-20 | Disposition: A | Discharge status: Home / Self Care | Payer: Self-pay | Attending: Emergency Medicine | Admitting: Emergency Medicine

## 2015-01-20 DIAGNOSIS — M545 Low back pain, unspecified: Secondary | ICD-10-CM

## 2015-01-20 DIAGNOSIS — Z8679 Personal history of other diseases of the circulatory system: Secondary | ICD-10-CM | POA: Insufficient documentation

## 2015-01-20 DIAGNOSIS — Z791 Long term (current) use of non-steroidal anti-inflammatories (NSAID): Secondary | ICD-10-CM | POA: Insufficient documentation

## 2015-01-20 DIAGNOSIS — Z87442 Personal history of urinary calculi: Secondary | ICD-10-CM | POA: Insufficient documentation

## 2015-01-20 DIAGNOSIS — G8929 Other chronic pain: Secondary | ICD-10-CM | POA: Insufficient documentation

## 2015-01-20 HISTORY — DX: Calculus of kidney: N20.0

## 2015-01-20 HISTORY — DX: Renal agenesis, unilateral: Q60.0

## 2015-01-20 LAB — URINALYSIS, ROUTINE W REFLEX MICROSCOPIC
BILIRUBIN URINE: NEGATIVE
Glucose, UA: NEGATIVE mg/dL
HGB URINE DIPSTICK: NEGATIVE
Ketones, ur: NEGATIVE mg/dL
Leukocytes, UA: NEGATIVE
Nitrite: NEGATIVE
PH: 6 (ref 5.0–8.0)
Protein, ur: NEGATIVE mg/dL
Specific Gravity, Urine: 1.003 — ABNORMAL LOW (ref 1.005–1.030)
UROBILINOGEN UA: 0.2 mg/dL (ref 0.0–1.0)

## 2015-01-20 LAB — CBC
HCT: 40 % (ref 39.0–52.0)
Hemoglobin: 13.4 g/dL (ref 13.0–17.0)
MCH: 31.2 pg (ref 26.0–34.0)
MCHC: 33.5 g/dL (ref 30.0–36.0)
MCV: 93.2 fL (ref 78.0–100.0)
PLATELETS: 175 10*3/uL (ref 150–400)
RBC: 4.29 MIL/uL (ref 4.22–5.81)
RDW: 13.7 % (ref 11.5–15.5)
WBC: 6.6 10*3/uL (ref 4.0–10.5)

## 2015-01-20 LAB — BASIC METABOLIC PANEL
Anion gap: 12 (ref 5–15)
BUN: 9 mg/dL (ref 6–20)
CALCIUM: 9.2 mg/dL (ref 8.9–10.3)
CO2: 21 mmol/L — ABNORMAL LOW (ref 22–32)
CREATININE: 1.12 mg/dL (ref 0.61–1.24)
Chloride: 104 mmol/L (ref 101–111)
GFR calc Af Amer: >60 mL/min (ref >60)
GFR calc non Af Amer: >60 mL/min (ref >60)
GLUCOSE: 98 mg/dL (ref 65–99)
Potassium: 3.6 mmol/L (ref 3.5–5.1)
Sodium: 137 mmol/L (ref 135–145)

## 2015-01-20 MED ORDER — HYDROCODONE-ACETAMINOPHEN 5-325 MG PO TABS: 2.0000 | ORAL_TABLET | Freq: Four times a day (QID) | ORAL | Status: DC | PRN

## 2015-01-20 MED ORDER — HYDROCODONE-ACETAMINOPHEN 5-325 MG PO TABS
2.0000 | ORAL_TABLET | Freq: Once | ORAL | Status: AC
  Administered 2015-01-20: 2 via ORAL
  Filled 2015-01-20: qty 2

## 2015-01-20 MED ORDER — CYCLOBENZAPRINE HCL 10 MG PO TABS
10.0000 mg | ORAL_TABLET | Freq: Once | ORAL | Status: AC
  Administered 2015-01-20: 10 mg via ORAL
  Filled 2015-01-20: qty 1

## 2015-01-20 MED ORDER — HYDROMORPHONE HCL 1 MG/ML IJ SOLN
1.0000 mg | Freq: Once | INTRAMUSCULAR | Status: AC
  Administered 2015-01-20: 1 mg via INTRAVENOUS
  Filled 2015-01-20: qty 1

## 2015-01-20 NOTE — ED Provider Notes (Signed)
CSN: [id number]     Arrival date & time 01/20/15  1628 History   First MD Initiated Contact with Patient 01/20/15 1633     Chief Complaint  Patient presents with  . Nephrolithiasis     (Consider location/radiation/quality/duration/timing/severity/associated sxs/prior Treatment) Patient is a 44 y.o. male presenting with flank pain. The history is provided by the patient.  Flank Pain This is a recurrent problem. The current episode started 6 to 12 hours ago. The problem occurs constantly. The problem has not changed since onset.Pertinent negatives include no chest pain, no abdominal pain and no shortness of breath. Nothing aggravates the symptoms. Nothing relieves the symptoms.    Past Medical History  Diagnosis Date  . Renal disorder   . Kidney calculi   . Chronic back pain   . Migraines   . Nephrolithiasis   . Congenital single kidney   . Nephrolithiasis    History reviewed. No pertinent past surgical history. History reviewed. No pertinent family history. Social History  Substance Use Topics  . Smoking status: Never Smoker   . Smokeless tobacco: None  . Alcohol Use: Yes     Comment: 40 oz a day of beer    Review of Systems  Constitutional: Negative for fever.  Respiratory: Negative for cough and shortness of breath.   Cardiovascular: Negative for chest pain.  Gastrointestinal: Negative for abdominal pain.  Genitourinary: Positive for flank pain. Negative for dysuria and hematuria.  All other systems reviewed and are negative.     Allergies  Diclofenac and Prednisone  Home Medications   Prior to Admission medications   Medication Sig Start Date End Date Taking? Authorizing Provider  acetaminophen (TYLENOL) 500 MG tablet Take 1,000 mg by mouth every 6 (six) hours as needed for headache.    Historical Provider, MD  cephALEXin (KEFLEX) 500 MG capsule Take 1 capsule (500 mg total) by mouth 4 (four) times daily. Patient not taking: Reported on 08/19/2014 02/23/14    Carlisle Cater, PA-C  naproxen (NAPROSYN) 375 MG tablet Take 1 tablet (375 mg total) by mouth 2 (two) times daily. 12/05/14   Margarita Mail, PA-C  omeprazole (PRILOSEC) 20 MG capsule Take 20 mg by mouth daily as needed (heart burn).    Historical Provider, MD  oxyCODONE (OXY IR/ROXICODONE) 5 MG immediate release tablet Take 0.5-1 tablets (2.5-5 mg total) by mouth every 4 (four) hours as needed for severe pain. 12/05/14   Margarita Mail, PA-C  predniSONE (DELTASONE) 20 MG tablet 3 tabs po daily x 3 days, then 2 tabs x 3 days, then 1.5 tabs x 3 days, then 1 tab x 3 days, then 0.5 tabs x 3 days 12/05/14   Margarita Mail, PA-C  promethazine (PHENERGAN) 25 MG tablet Take 1 tablet (25 mg total) by mouth every 6 (six) hours as needed for nausea or vomiting. Patient not taking: Reported on 02/23/2014 12/29/13   Jarrett Soho Muthersbaugh, PA-C  tamsulosin (FLOMAX) 0.4 MG CAPS capsule 1 q HS to aid stone passage 08/19/14   Daleen Bo, MD  traMADol (ULTRAM) 50 MG tablet Take 50 mg by mouth every 6 (six) hours as needed for moderate pain.    Historical Provider, MD   BP 128/81 mmHg  Pulse 102  Temp(Src) 98.9 F (37.2 C) (Oral)  Resp 18  Ht 6\' 1"  (1.854 m)  Wt 193 lb (87.544 kg)  BMI 25.47 kg/m2  SpO2 100% Physical Exam  Constitutional: He is oriented to person, place, and time. He appears well-developed and well-nourished. No distress.  HENT:  Head: Normocephalic and atraumatic.  Mouth/Throat: Oropharynx is clear and moist. No oropharyngeal exudate.  Eyes: EOM are normal. Pupils are equal, round, and reactive to light.  Neck: Normal range of motion. Neck supple.  Cardiovascular: Normal rate and regular rhythm.  Exam reveals no friction rub.   No murmur heard. Pulmonary/Chest: Effort normal and breath sounds normal. No respiratory distress. He has no wheezes. He has no rales.  Abdominal: Soft. He exhibits no distension. There is no tenderness. There is no rebound.  Musculoskeletal: Normal range of motion. He  exhibits no edema.       Lumbar back: He exhibits spasm.  Neurological: He is alert and oriented to person, place, and time.  Skin: No rash noted. He is not diaphoretic.  Nursing note and vitals reviewed.   ED Course  Procedures (including critical care time) Labs Review Labs Reviewed  URINALYSIS, ROUTINE W REFLEX MICROSCOPIC (NOT AT [facility]) - Abnormal; Notable for the following:    Specific Gravity, Urine 1.003 (*)    All other components within normal limits  BASIC METABOLIC PANEL - Abnormal; Notable for the following:    CO2 21 (*)    All other components within normal limits  CBC    Imaging Review US Renal  01/20/2015   CLINICAL DATA:  Left flank pain times 10 hours.  Solitary kidney.  EXAM: RENAL / URINARY TRACT ULTRASOUND COMPLETE  COMPARISON:  CT 08/19/2014  FINDINGS: Right Kidney:  Not identified.  Left Kidney:  Length: 12.1 cm. Echogenicity within normal limits. No mass or hydronephrosis visualized.  Bladder:  Appears normal for degree of bladder distention.  IMPRESSION: 1. Unremarkable solitary left kidney.  No hydronephrosis   Electronically Signed   By: Lucrezia Europe M.D.   On: 01/20/2015 20:07   I have personally reviewed and evaluated these images and lab results as part of my medical decision-making.   EKG Interpretation None      MDM   Final diagnoses:  Left-sided low back pain without sciatica    60M with hx of nephrolithiasis, solitary congential kidney presents with L flank pain similar to prior kidney stones. No vomiting, no diarrhea, no hematuria, no fever. He's also having R sided sciatica pain also, similar to prior. Here spasm noted in his lower lumbar area. No L CVA tenderness. Will [place] since he's had several CTs and has only a solitary kidney.  [place] normal. UA negative, no blood. Could be kidney stone, however also having some right lower back pain. Patient stable for discharge, given small amount of pain medicine. No hydronephrosis on his [place], no stone  identified.    Evelina Bucy, MD 01/20/15 [phone]

## 2015-01-20 NOTE — ED Notes (Signed)
Dr. Walden at bedside 

## 2015-01-20 NOTE — ED Notes (Addendum)
Patient comes from home c/o polyuria and L flank pain since this morning.  Patient states he has chronic pain r/t sciatica but also a hx of kidney stones, and this feels more like a kidney stone. Patient states he has atrophic kidney on R side.

## 2015-01-20 NOTE — Discharge Instructions (Signed)

## 2015-04-21 ENCOUNTER — Emergency Department (HOSPITAL_COMMUNITY)
Admission: EM | Admit: 2015-04-21 | Discharge: 2015-04-21 | Disposition: A | Discharge status: Home / Self Care | Payer: Self-pay | Attending: Emergency Medicine | Admitting: Emergency Medicine

## 2015-04-21 ENCOUNTER — Emergency Department (HOSPITAL_COMMUNITY): Payer: Self-pay

## 2015-04-21 ENCOUNTER — Encounter (HOSPITAL_COMMUNITY): Payer: Self-pay | Admitting: *Deleted

## 2015-04-21 DIAGNOSIS — J189 Pneumonia, unspecified organism: Secondary | ICD-10-CM

## 2015-04-21 DIAGNOSIS — G8929 Other chronic pain: Secondary | ICD-10-CM | POA: Insufficient documentation

## 2015-04-21 DIAGNOSIS — N189 Chronic kidney disease, unspecified: Secondary | ICD-10-CM | POA: Insufficient documentation

## 2015-04-21 DIAGNOSIS — Z87442 Personal history of urinary calculi: Secondary | ICD-10-CM | POA: Insufficient documentation

## 2015-04-21 DIAGNOSIS — Z792 Long term (current) use of antibiotics: Secondary | ICD-10-CM | POA: Insufficient documentation

## 2015-04-21 DIAGNOSIS — Q600 Renal agenesis, unilateral: Secondary | ICD-10-CM | POA: Insufficient documentation

## 2015-04-21 DIAGNOSIS — G43909 Migraine, unspecified, not intractable, without status migrainosus: Secondary | ICD-10-CM | POA: Insufficient documentation

## 2015-04-21 DIAGNOSIS — Z79899 Other long term (current) drug therapy: Secondary | ICD-10-CM | POA: Insufficient documentation

## 2015-04-21 DIAGNOSIS — J159 Unspecified bacterial pneumonia: Secondary | ICD-10-CM | POA: Insufficient documentation

## 2015-04-21 MED ORDER — AZITHROMYCIN 250 MG PO TABS: 250.0000 mg | ORAL_TABLET | Freq: Every day | ORAL | Status: DC

## 2015-04-21 MED ORDER — AZITHROMYCIN 250 MG PO TABS
500.0000 mg | ORAL_TABLET | Freq: Once | ORAL | Status: AC
  Administered 2015-04-21: 500 mg via ORAL
  Filled 2015-04-21: qty 2

## 2015-04-21 NOTE — ED Notes (Signed)
Hanna PA at bedside   

## 2015-04-21 NOTE — Discharge Instructions (Signed)
Community-Acquired Pneumonia, Adult Pneumonia is an infection of the lungs. One type of pneumonia can happen while a person is in a hospital. A different type can happen when a person is not in a hospital (community-acquired pneumonia). It is easy for this kind to spread from person to person. It can spread to you if you breathe near an infected person who coughs or sneezes. Some symptoms include:  A dry cough.  A wet (productive) cough.  Fever.  Sweating.  Chest pain. HOME CARE  Take over-the-counter and prescription medicines only as told by your doctor.  Only take cough medicine if you are losing sleep.  If you were prescribed an antibiotic medicine, take it as told by your doctor. Do not stop taking the antibiotic even if you start to feel better.  Sleep with your head and neck raised (elevated). You can do this by putting a few pillows under your head, or you can sleep in a recliner.  Do not use tobacco products. These include cigarettes, chewing tobacco, and e-cigarettes. If you need help quitting, ask your doctor.  Drink enough water to keep your pee (urine) clear or pale yellow. A shot (vaccine) can help prevent pneumonia. Shots are often suggested for:  People older than 46 years of age.  People older than 46 years of age:  Who are having cancer treatment.  Who have long-term (chronic) lung disease.  Who have problems with their body's defense system (immune system). You may also prevent pneumonia if you take these actions:  Get the flu (influenza) shot every year.  Go to the dentist as often as told.  Wash your hands often. If soap and water are not available, use hand sanitizer. GET HELP IF:  You have a fever.  You lose sleep because your cough medicine does not help. GET HELP RIGHT AWAY IF:  You are short of breath and it gets worse.  You have more chest pain.  Your sickness gets worse. This is very serious if:  You are an older adult.  Your  body's defense system is weak.  You cough up blood.   This information is not intended to replace advice given to you by your health care provider. Make sure you discuss any questions you have with your health care provider.   Document Released: 09/19/2007 Document Revised: 12/22/2014 Document Reviewed: 07/28/2014 Elsevier Interactive Patient Education 2016 [employer].  Emergency Department Resource Guide 1) Find a Doctor and Pay Out of Pocket Although you won't have to find out who is covered by your insurance plan, it is a good idea to ask around and get recommendations. You will then need to call the office and see if the doctor you have chosen will accept you as a new patient and what types of options they offer for patients who are self-pay. Some doctors offer discounts or will set up payment plans for their patients who do not have insurance, but you will need to ask so you aren't surprised when you get to your appointment.  2) Contact Your Local Health Department Not all health departments have doctors that can see patients for sick visits, but many do, so it is worth a call to see if yours does. If you don't know where your local health department is, you can check in your phone book. The CDC also has a tool to help you locate your state's health department, and many state websites also have listings of all of their local health departments.  3)  Find a [facility] If your illness is not likely to be very severe or complicated, you may want to try a walk in clinic. These are popping up all over the country in pharmacies, drugstores, and shopping centers. They're usually staffed by nurse practitioners or physician assistants that have been trained to treat common illnesses and complaints. They're usually fairly quick and inexpensive. However, if you have serious medical issues or chronic medical problems, these are probably not your best option.  No Primary Care Doctor: - Call Health  Connect at  [phone] - they can help you locate a primary care doctor that  accepts your insurance, provides certain services, etc. - Physician Referral Service- [phone]  Chronic Pain Problems: Organization         Address  Phone   Notes  [facility]  [phone] Patients need to be referred by their primary care doctor.   Medication Assistance: Organization         Address  Phone   Notes  [facility] Medication [facility] [address] [place], [place] [address] [phone] --Must be a resident of [facility] -- Must have NO insurance coverage whatsoever (no Medicaid/ Medicare, etc.) -- The pt. MUST have a primary care doctor that directs their care regularly and follows them in the community   MedAssist  [phone]   [employer]  [phone]    Agencies that provide inexpensive medical care: Organization         Address  Phone   Notes  [place]  [phone]   [facility]    [phone]   [facility] [place], [place] [address] [phone]   [place] [address] [place] [phone]   Planned Parenthood    [phone]   [facility]    [phone]   [place] [address], [place] Phone:  [phone], Fax:  [phone] Hours of Operation:  9 am - 6 pm, M-F.  Also accepts Medicaid/Medicare and self-pay.  [facility] for [place], [address], [place] Phone: [phone], Fax: [phone]. Hours of Operation:  8:30 am - 5:30 pm, M-F.  Also accepts Medicaid and self-pay.  [facility] [place] [address], [place] Phone: [phone]   [place], [place], [place] [phone], Ext. [phone] Mondays & Thursdays: 7-9 AM.  First 15 patients are seen on a first come, first serve basis.     [place] Providers:  Organization         Address  Phone   Notes  [facility] [address], [place] [phone] Also accepts self-pay patients.  [facility] [id number] [place], [place]  [phone]   [place], [address], [place] [phone]   [facility] [address], [place] [phone]   Lucianne Lei [address], [place]   [phone] Only accepts [place] Access [place] patients after they have their name applied to their card.   Self-Pay (no insurance) in [facility]:  Organization         Address  Phone   Notes  Sickle Cell Patients, [facility] [address]  [place] [phone]   [facility] [place] [phone]   [facility] [place]  [place], [address], [place] [phone]   [facility]/Dr. Osei-Bonsu  [address], [place] [address], [place] [phone] Phone number for both [place] and [place] locations is the same.  [facility] [address] [place] [phone]   [facility] [address], [place] or [address] [phone]   [facility] [address], [place] [phone], phone; [phone], fax Sees patients 1st and 3rd Saturday of every month.  Must not qualify for public or private insurance (i.e. Medicaid, Medicare, [place] Health Choice, Veterans' Benefits)  Household income should be no more than 200% of the poverty level The clinic cannot treat you if you are pregnant or think you are pregnant  Sexually transmitted diseases are not treated at the clinic.    Dental Care: Organization         Address  Phone  Notes  [facility]  Department of [facility] [place] [phone] Accepts children up to age 75 who are enrolled in [place] or [place]; pregnant women with a Medicaid card; and children who have applied for Medicaid or [place] Health Choice, but were declined, whose parents can pay a reduced fee at time of service.  [facility] Department of [facility]  [address], [place] [phone] Accepts children up to age 23 who are enrolled in [place] or [place]; pregnant women with a Medicaid card; and children who have applied for Medicaid or [place] Health Choice, but were declined, whose parents can pay a reduced fee at time of service.  [facility] Adult [facility] Access PROGRAM  [place] [phone] Patients are seen by appointment only. Walk-ins are not accepted. [place] will see patients 34 years of age and older. Monday - Tuesday (8am-5pm) Most Wednesdays (8:30-5pm) $30 per visit, cash only  [facility] Adult Dental Access PROGRAM  [address], [facility] [phone] Patients are seen by appointment only. Walk-ins are not accepted. [place] will see patients 77 years of age and older. One Wednesday Evening (Monthly: Volunteer Based).  $30 per visit, cash only  [place]  [phone] for adults; Children under age 25, call Graduate Pediatric Dentistry at [phone]. Children aged 4-14, please call [phone] to request a pediatric application.  Dental services are provided in all areas of dental care including fillings, crowns and bridges, complete and partial dentures, implants, gum treatment, root canals, and extractions. Preventive care is also provided. Treatment is provided to both adults and children. Patients are selected via a lottery and there is often a waiting list.   [facility] [address], [place]  [phone]  [email or web address]   [facility] [address] [place], [place] [phone], Ext. [phone] Second and Fourth Thursday of each month, opens at 6:30 AM; Clinic ends at 9 AM.  Patients are seen on a first-come first-served basis, and a limited number are seen during each clinic.   [facility]  [address] Hillard Danker [place], [place] [phone]   Eligibility Requirements You must have lived in [place], [place],  or [place] counties for at least the last three months.   You cannot be eligible for state or federal sponsored [employer], including [employer], [place], or [employer].   You generally cannot be eligible for healthcare insurance through your employer.    How to apply: Eligibility screenings are held every Tuesday and Wednesday afternoon from 1:00 pm until 4:00 pm. You do not need an appointment for the interview!  [facility] [address], [place], [place]   [place]  [place] Department  [place]  [phone]    Behavioral Health Resources in the Community: Intensive Outpatient Programs Organization         Address  Phone  Notes  [place]. [address], [place], [place] [phone]   [facility]patient [address] [place], [place]   ADS: Alcohol & Drug Svcs [address] [place], [place]   [place] [address]  [place], [place] or [phone]   Substance Abuse Resources Organization         Address  Phone  Notes  Alcohol and Drug Services  [phone]   [place]  [phone]   The [place]   Chinita Pester  [phone]   Residential & Outpatient Substance Abuse Program  [phone]   Psychological Services Organization          Address  Phone  Notes  [facility]  [place]  [phone]   [place] [address] [place] or [phone]    Mobile Crisis Teams Organization         Address  Phone  Notes  [facility], Mobile Crisis Care Unit  [phone]   [facility]  [address] [place], [place]   [place] Levels [address] [place] [phone]    Self-Help/Support Groups Organization         Address  Phone             Notes  [place]. of [place] - variety of support groups  [place] Call for more information  Narcotics Anonymous (NA), [facility] [address], [employer] [place]  2 meetings at this location   Special educational needs teacher         Address  Phone  Notes  [facility]idential Treatment [place],    [place]  [phone]   [facility]  [address] [place] [phone], [place], [place]   [place], [place] [phone] Admissions: 8am-3pm M-F  Incentives Substance [place] [address]    [place], [place] [phone]   [facility] [address] [place], [place], [place]   [facility] [address]  [place], [place]   [facility] Programs - Intensive Outpatient [address] Kristeen Mans [address], [place], [place]   [facility] ([place].) [place].,  [place], [place] or [phone]   [facility] (RTS) [address], [place], [place] Accepts Medicaid  [facility] [place] [address]  [place] [phone] Substance Abuse/Addiction Treatment   [facility] Resources Organization         Address  Phone  Notes  [facility]  [phone]   Domenic Schwab, PhD [address] Arlis Porta [place], [place]   [phone] or [phone]   [place], [place] [phone]   [place] [address], [place], [place] [phone] Insurance/Medicaid/sponsorship through [facility] and Families [address], [address] [place]                                    [place], [place] [phone] [place] [address], [place] [phone]    Dr. Adele Schilder  [phone]   [facility] Dept. 1) [address] [place] 2) [place] 3)  [address], [place] [phone]  [phone]   [place] [phone] or [phone] (After Hours)

## 2015-04-21 NOTE — ED Notes (Signed)
Pt c/o nonproductive cough x 3 days with chills and nightsweats. Pt has been taking OTC medications without relief.

## 2015-04-21 NOTE — ED Provider Notes (Signed)
CSN: [id number]     Arrival date & time 04/21/15  [id number] History   First MD Initiated Contact with Patient 04/21/15 [phone]     Chief Complaint  Patient presents with  . Cough   (Consider location/radiation/quality/duration/timing/severity/associated sxs/prior Treatment) Patient is a 45 y.o. male presenting with cough. The history is provided by the patient. No language interpreter was used.  Cough Associated symptoms: chills   Associated symptoms: no chest pain, no fever, no shortness of breath and no wheezing   Kyle Berry is a 1-year-old male with a history of chronic back pain, kidney stones, and chronic kidney disease (renal agenesis) who presents with blood streaked white sputum cough 1 week and chills for the past 3 nights. He denies any treatment prior to arrival. He denies any pain with inspiration, fever, shortness of breath, chest pain, abdominal pain. Denies any smoking history or history of cancer.   Past Medical History  Diagnosis Date  . Renal disorder   . Kidney calculi   . Chronic back pain   . Migraines   . Nephrolithiasis   . Congenital single kidney   . Nephrolithiasis    History reviewed. No pertinent past surgical history. History reviewed. No pertinent family history. Social History  Substance Use Topics  . Smoking status: Never Smoker   . Smokeless tobacco: None  . Alcohol Use: Yes     Comment: 40 oz a day of beer    Review of Systems  Constitutional: Positive for chills. Negative for fever.  Respiratory: Positive for cough. Negative for shortness of breath and wheezing.   Cardiovascular: Negative for chest pain.  All other systems reviewed and are negative.     Allergies  Diclofenac and Prednisone  Home Medications   Prior to Admission medications   Medication Sig Start Date End Date Taking? Authorizing Provider  acetaminophen (TYLENOL) 500 MG tablet Take 1,000 mg by mouth every 6 (six) hours as needed for headache.    Historical Provider, MD   amLODipine (NORVASC) 5 MG tablet Take 5 mg by mouth daily.    Historical Provider, MD  azithromycin (ZITHROMAX) 250 MG tablet Take 1 tablet (250 mg total) by mouth daily. Take 1 every day until finished. You received her first dose in the ED. 04/21/15   Ottie Glazier, PA-C  cephALEXin (KEFLEX) 500 MG capsule Take 1 capsule (500 mg total) by mouth 4 (four) times daily. Patient not taking: Reported on 08/19/2014 02/23/14   Carlisle Cater, PA-C  HYDROcodone-acetaminophen (NORCO/VICODIN) 5-325 MG tablet Take 2 tablets by mouth every 6 (six) hours as needed for moderate pain. 01/20/15   Evelina Bucy, MD  naproxen (NAPROSYN) 375 MG tablet Take 1 tablet (375 mg total) by mouth 2 (two) times daily. Patient taking differently: Take 375 mg by mouth 2 (two) times daily. Pain 12/05/14   Margarita Mail, PA-C  omeprazole (PRILOSEC) 20 MG capsule Take 20 mg by mouth daily as needed (heart burn).    Historical Provider, MD  oxyCODONE (OXY IR/ROXICODONE) 5 MG immediate release tablet Take 0.5-1 tablets (2.5-5 mg total) by mouth every 4 (four) hours as needed for severe pain. 12/05/14   Margarita Mail, PA-C  predniSONE (DELTASONE) 20 MG tablet 3 tabs po daily x 3 days, then 2 tabs x 3 days, then 1.5 tabs x 3 days, then 1 tab x 3 days, then 0.5 tabs x 3 days Patient not taking: Reported on 01/20/2015 12/05/14   Margarita Mail, PA-C  promethazine (PHENERGAN) 25 MG tablet Take 1 tablet (25  mg total) by mouth every 6 (six) hours as needed for nausea or vomiting. Patient not taking: Reported on 02/23/2014 12/29/13   Jarrett Soho Muthersbaugh, PA-C  propranolol (INDERAL) 20 MG tablet Take 20 mg by mouth 3 (three) times daily. Take 3 times a day per pharmacy records    Historical Provider, MD  tamsulosin (FLOMAX) 0.4 MG CAPS capsule 1 q HS to aid stone passage Patient not taking: Reported on 01/20/2015 08/19/14   Daleen Bo, MD  traMADol (ULTRAM) 50 MG tablet Take 50 mg by mouth every 6 (six) hours as needed for moderate pain.     Historical Provider, MD   BP 130/87 mmHg  Pulse 76  Temp(Src) 98.3 F (36.8 C) (Oral)  Resp 18  SpO2 92% Physical Exam  Constitutional: He is oriented to person, place, and time. He appears well-developed and well-nourished. No distress.  HENT:  Head: Normocephalic and atraumatic.  Eyes: Conjunctivae are normal.  Neck: Normal range of motion. Neck supple.  Cardiovascular: Normal rate, regular rhythm and normal heart sounds.   Pulmonary/Chest: Effort normal and breath sounds normal. No respiratory distress. He has no wheezes. He has no rales.  Lungs clear to auscultation bilaterally. No wheezing or decreased breath sounds. 92% oxygen on room air at bedside but no respiratory distress or difficulty breathing. Patient is sitting comfortably and without use of accessory muscles.  Abdominal: Soft. There is no tenderness.  Musculoskeletal: Normal range of motion.  No lower extremity edema or pain.  Neurological: He is alert and oriented to person, place, and time.  Skin: Skin is warm and dry.  Psychiatric: He has a normal mood and affect.  Nursing note and vitals reviewed.   ED Course  Procedures (including critical care time) Labs Review Labs Reviewed - No data to display  Imaging Review Dg Chest 2 View  04/21/2015  CLINICAL DATA:  Cough and congestion. EXAM: CHEST  2 VIEW COMPARISON:  None. FINDINGS: Mediastinum and hilar structures normal. Heart size normal. No pulmonary venous congestion. Low lung volumes. Minimal left base infiltrate cannot be excluded. No pleural effusion or pneumothorax . No acute bony abnormality. IMPRESSION: Low lung volumes.  Minimal left base infiltrate cannot be excluded . Electronically Signed   By: Marcello Moores  Register   On: 04/21/2015 09:40   I have personally reviewed and evaluated these image results as part of my medical decision-making.   EKG Interpretation None      MDM   Final diagnoses:  Community acquired pneumonia  Patient with chronic  kidney disease presents for blood streaked white sputum and cough 1 week. He also reports chills and night sweats. He is well appearing and in no acute respiratory distress. Chest x-ray shows minimal left base infiltrate but no pleural effusion or pneumothorax. I do not suspect PE. He has no history of DVT or PE. No recent surgery or travel. He has no leg swelling.  Patient was treated with azithromycin for community-acquired pneumonia. He is afebrile and well-appearing. He denies being short of breath. His oxygen is 92% on room air which I suspect is caused by pneumonia. I thoroughly discussed return precautions with the patient as well as follow-up within 48 hours. Patient agrees with plan. Medications  azithromycin (ZITHROMAX) tablet 500 mg (500 mg Oral Given 04/21/15 1012)   Filed Vitals:   04/21/15 0915 04/21/15 1007  BP: 118/89 130/87  Pulse: 77 76  Temp:  98.3 F (36.8 C)  Resp: 18 18         Amonda Brillhart  Patel-Mills, PA-C 04/21/15 2006  [place] Gambler, MD 04/22/15 1014

## 2015-04-26 ENCOUNTER — Emergency Department (HOSPITAL_COMMUNITY): Payer: Self-pay

## 2015-04-26 ENCOUNTER — Emergency Department (HOSPITAL_COMMUNITY)
Admission: EM | Admit: 2015-04-26 | Discharge: 2015-04-26 | Disposition: A | Discharge status: Home / Self Care | Payer: Self-pay | Attending: Emergency Medicine | Admitting: Emergency Medicine

## 2015-04-26 ENCOUNTER — Encounter (HOSPITAL_COMMUNITY): Payer: Self-pay

## 2015-04-26 DIAGNOSIS — Z79899 Other long term (current) drug therapy: Secondary | ICD-10-CM | POA: Insufficient documentation

## 2015-04-26 DIAGNOSIS — Q600 Renal agenesis, unilateral: Secondary | ICD-10-CM | POA: Insufficient documentation

## 2015-04-26 DIAGNOSIS — J9801 Acute bronchospasm: Secondary | ICD-10-CM | POA: Insufficient documentation

## 2015-04-26 DIAGNOSIS — R0982 Postnasal drip: Secondary | ICD-10-CM | POA: Insufficient documentation

## 2015-04-26 DIAGNOSIS — G8929 Other chronic pain: Secondary | ICD-10-CM | POA: Insufficient documentation

## 2015-04-26 DIAGNOSIS — Z87442 Personal history of urinary calculi: Secondary | ICD-10-CM | POA: Insufficient documentation

## 2015-04-26 DIAGNOSIS — Z8701 Personal history of pneumonia (recurrent): Secondary | ICD-10-CM | POA: Insufficient documentation

## 2015-04-26 DIAGNOSIS — Z87448 Personal history of other diseases of urinary system: Secondary | ICD-10-CM | POA: Insufficient documentation

## 2015-04-26 DIAGNOSIS — R079 Chest pain, unspecified: Secondary | ICD-10-CM | POA: Insufficient documentation

## 2015-04-26 DIAGNOSIS — Z8679 Personal history of other diseases of the circulatory system: Secondary | ICD-10-CM | POA: Insufficient documentation

## 2015-04-26 DIAGNOSIS — R0602 Shortness of breath: Secondary | ICD-10-CM | POA: Insufficient documentation

## 2015-04-26 LAB — CBC WITH DIFFERENTIAL/PLATELET
Basophils Absolute: 0 10*3/uL (ref 0.0–0.1)
Basophils Relative: 0 %
Eosinophils Absolute: 0.1 10*3/uL (ref 0.0–0.7)
Eosinophils Relative: 2 %
HCT: 40.9 % (ref 39.0–52.0)
Hemoglobin: 13.9 g/dL (ref 13.0–17.0)
Lymphocytes Relative: 41 %
Lymphs Abs: 2.5 10*3/uL (ref 0.7–4.0)
MCH: 31.7 pg (ref 26.0–34.0)
MCHC: 34 g/dL (ref 30.0–36.0)
MCV: 93.4 fL (ref 78.0–100.0)
Monocytes Absolute: 0.3 10*3/uL (ref 0.1–1.0)
Monocytes Relative: 5 %
Neutro Abs: 3.2 10*3/uL (ref 1.7–7.7)
Neutrophils Relative %: 52 %
Platelets: 191 10*3/uL (ref 150–400)
RBC: 4.38 MIL/uL (ref 4.22–5.81)
RDW: 13.8 % (ref 11.5–15.5)
WBC: 6.2 10*3/uL (ref 4.0–10.5)

## 2015-04-26 LAB — COMPREHENSIVE METABOLIC PANEL
ALK PHOS: 63 U/L (ref 38–126)
ALT: 55 U/L (ref 17–63)
ANION GAP: 10 (ref 5–15)
AST: 39 U/L (ref 15–41)
Albumin: 3.8 g/dL (ref 3.5–5.0)
BUN: 11 mg/dL (ref 6–20)
CALCIUM: 9.2 mg/dL (ref 8.9–10.3)
CO2: 22 mmol/L (ref 22–32)
CREATININE: 1.22 mg/dL (ref 0.61–1.24)
Chloride: 107 mmol/L (ref 101–111)
Glucose, Bld: 104 mg/dL — ABNORMAL HIGH (ref 65–99)
Potassium: 3.8 mmol/L (ref 3.5–5.1)
SODIUM: 139 mmol/L (ref 135–145)
Total Bilirubin: 0.5 mg/dL (ref 0.3–1.2)
Total Protein: 6.8 g/dL (ref 6.5–8.1)

## 2015-04-26 LAB — BRAIN NATRIURETIC PEPTIDE: B Natriuretic Peptide: 11.7 pg/mL (ref 0.0–100.0)

## 2015-04-26 LAB — TROPONIN I: Troponin I: <0.03 ng/mL (ref <0.031)

## 2015-04-26 MED ORDER — ALBUTEROL SULFATE (2.5 MG/3ML) 0.083% IN NEBU
5.0000 mg | INHALATION_SOLUTION | Freq: Once | RESPIRATORY_TRACT | Status: AC
  Administered 2015-04-26: 5 mg via RESPIRATORY_TRACT
  Filled 2015-04-26: qty 6

## 2015-04-26 MED ORDER — ALBUTEROL SULFATE HFA 108 (90 BASE) MCG/ACT IN AERS
2.0000 | INHALATION_SPRAY | RESPIRATORY_TRACT | Status: DC
  Administered 2015-04-26: 2 via RESPIRATORY_TRACT
  Filled 2015-04-26: qty 6.7

## 2015-04-26 NOTE — ED Notes (Signed)
Patient undressed, in gown, on continuous pulse oximetry and blood pressure cuff; visitor at bedside 

## 2015-04-26 NOTE — ED Notes (Signed)
Pt verbalized understanding of d/c instructions and follow-up care. No further questions/concerns, VSS, ambulatory w/ steady gait (refused wheelchair) 

## 2015-04-26 NOTE — ED Notes (Signed)
Patient was placed on cardiac monitor.

## 2015-04-26 NOTE — Discharge Instructions (Signed)
Bronchospasm, Adult  A bronchospasm is a spasm or tightening of the airways going into the lungs. During a bronchospasm breathing becomes more difficult because the airways get smaller. When this happens there can be coughing, a whistling sound when breathing (wheezing), and difficulty breathing. Bronchospasm is often associated with asthma, but not all patients who experience a bronchospasm have asthma.  CAUSES   A bronchospasm is caused by inflammation or irritation of the airways. The inflammation or irritation may be triggered by:   · Allergies (such as to animals, pollen, food, or mold). Allergens that cause bronchospasm may cause wheezing immediately after exposure or many hours later.    · Infection. Viral infections are believed to be the most common cause of bronchospasm.    · Exercise.    · Irritants (such as pollution, cigarette smoke, strong odors, aerosol sprays, and paint fumes).    · Weather changes. Winds increase molds and pollens in the air. Rain refreshes the air by washing irritants out. Cold air may cause inflammation.    · Stress and emotional upset.    SIGNS AND SYMPTOMS   · Wheezing.    · Excessive nighttime coughing.    · Frequent or severe coughing with a simple cold.    · Chest tightness.    · Shortness of breath.    DIAGNOSIS   Bronchospasm is usually diagnosed through a history and physical exam. Tests, such as chest X-rays, are sometimes done to look for other conditions.  TREATMENT   · Inhaled medicines can be given to open up your airways and help you breathe. The medicines can be given using either an inhaler or a nebulizer machine.  · Corticosteroid medicines may be given for severe bronchospasm, usually when it is associated with asthma.  HOME CARE INSTRUCTIONS   · Always have a plan prepared for seeking medical care. Know when to call your health care provider and local emergency services (911 in the [place] Know where you can access local emergency care.  · Only take medicines as  directed by your health care provider.  · If you were prescribed an inhaler or nebulizer machine, ask your health care provider to explain how to use it correctly. Always use a spacer with your inhaler if you were given one.  · It is necessary to remain calm during an attack. Try to relax and breathe more slowly.   · Control your home environment in the following ways:      Change your heating and air conditioning filter at least once a month.      Limit your use of fireplaces and wood stoves.    Do not smoke and do not allow smoking in your home.      Avoid exposure to perfumes and fragrances.      Get rid of pests (such as roaches and mice) and their droppings.      Throw away plants if you see mold on them.      Keep your house clean and dust free.      Replace carpet with wood, tile, or vinyl flooring. Carpet can trap dander and dust.      Use allergy-proof pillows, mattress covers, and box spring covers.      Wash bed sheets and blankets every week in hot water and dry them in a dryer.      Use blankets that are made of polyester or cotton.      Wash hands frequently.  SEEK MEDICAL CARE IF:   · You have muscle aches.    · You have chest pain.    · The sputum changes from clear or   white to yellow, green, gray, or bloody.    · The sputum you cough up gets thicker.    · There are problems that may be related to the medicine you are given, such as a rash, itching, swelling, or trouble breathing.    SEEK IMMEDIATE MEDICAL CARE IF:   · You have worsening wheezing and coughing even after taking your prescribed medicines.    · You have increased difficulty breathing.    · You develop severe chest pain.  MAKE SURE YOU:   · Understand these instructions.  · Will watch your condition.  · Will get help right away if you are not doing well or get worse.     This information is not intended to replace advice given to you by your health care provider. Make sure you discuss any questions you have with your health care  provider.     Document Released: 04/05/2003 Document Revised: 04/23/2014 Document Reviewed: 09/22/2012  Elsevier Interactive Patient Education ©2016 Else[employer] Inc.

## 2015-04-26 NOTE — ED Notes (Signed)
Pt seen at [facility] 1/5, dx w/ pneumonia. Pt reports symptoms not improving, has been taking prescribed abx. Pt unable to get appt w/ PCP to follow-up. Productive cough w/ clear sputum.

## 2015-04-26 NOTE — ED Provider Notes (Signed)
CSN: [id number]     Arrival date & time 04/26/15  0940 History   First MD Initiated Contact with Patient 04/26/15 [phone]     Chief Complaint  Patient presents with  . Follow-up      HPI Patient presents to the emergency department complaining of ongoing cough despite a recent diagnosis of pneumonia and treatment with azithromycin.  He finished his last dose of azithromycin yesterday.  He reports that he continues to cough.  He reports some shortness of breath when he lays flat.  No history of congestive heart failure.  Denies DVT or PE history.  Denies unilateral leg swelling.  Denies edema in his lower extremities.  Reports no significant exertional shortness of breath.  He states his cough kept him up last night.  He still has some posterior nasal drainage  Past Medical History  Diagnosis Date  . Renal disorder   . Kidney calculi   . Chronic back pain   . Migraines   . Nephrolithiasis   . Congenital single kidney   . Nephrolithiasis    History reviewed. No pertinent past surgical history. History reviewed. No pertinent family history. Social History  Substance Use Topics  . Smoking status: Never Smoker   . Smokeless tobacco: None  . Alcohol Use: Yes     Comment: 40 oz a day of beer    Review of Systems  All other systems reviewed and are negative.     Allergies  Diclofenac and Prednisone  Home Medications   Prior to Admission medications   Medication Sig Start Date End Date Taking? Authorizing Provider  acetaminophen (TYLENOL) 500 MG tablet Take 1,000 mg by mouth every 6 (six) hours as needed for headache.   Yes Historical Provider, MD  amLODipine (NORVASC) 5 MG tablet Take 5 mg by mouth daily.   Yes Historical Provider, MD  HYDROcodone-acetaminophen (NORCO/VICODIN) 5-325 MG tablet Take 2 tablets by mouth every 6 (six) hours as needed for moderate pain. 01/20/15  Yes Evelina Bucy, MD  omeprazole (PRILOSEC) 20 MG capsule Take 20 mg by mouth daily as needed (heart burn).    Yes Historical Provider, MD  traMADol (ULTRAM) 50 MG tablet Take 50 mg by mouth every 6 (six) hours as needed for moderate pain.   Yes Historical Provider, MD  azithromycin (ZITHROMAX) 250 MG tablet Take 1 tablet (250 mg total) by mouth daily. Take 1 every day until finished. You received her first dose in the ED. Patient not taking: Reported on 04/26/2015 04/21/15   Ottie Glazier, PA-C  propranolol (INDERAL) 20 MG tablet Take 20 mg by mouth 3 (three) times daily. Reported on 04/26/2015    Historical Provider, MD   BP 127/86 mmHg  Pulse 74  Temp(Src) 98.4 F (36.9 C)  Resp 19  SpO2 97% Physical Exam  Constitutional: He is oriented to person, place, and time. He appears well-developed and well-nourished.  HENT:  Head: Normocephalic and atraumatic.  Eyes: EOM are normal.  Neck: Normal range of motion.  Cardiovascular: Normal rate, regular rhythm, normal heart sounds and intact distal pulses.   Pulmonary/Chest: Effort normal and breath sounds normal. No respiratory distress. He has no wheezes. He has no rales.  Abdominal: Soft. He exhibits no distension. There is no tenderness.  Musculoskeletal: Normal range of motion.  Neurological: He is alert and oriented to person, place, and time.  Skin: Skin is warm and dry.  Psychiatric: He has a normal mood and affect. Judgment normal.  Nursing note and vitals reviewed.  ED Course  Procedures (including critical care time) Labs Review Labs Reviewed  COMPREHENSIVE METABOLIC PANEL - Abnormal; Notable for the following:    Glucose, Bld 104 (*)    All other components within normal limits  CBC WITH DIFFERENTIAL/PLATELET  BRAIN NATRIURETIC PEPTIDE  TROPONIN I    Imaging Review Dg Chest 2 View  04/26/2015  CLINICAL DATA:  Cough and fever, recent pneumonia EXAM: CHEST - 2 VIEW COMPARISON:  04/21/2015 FINDINGS: Cardiac shadow is within normal limits. The lungs are well aerated bilaterally. No acute bony abnormality is noted. IMPRESSION: No  active disease. Electronically Signed   By: Inez Catalina M.D.   On: 04/26/2015 11:07   I have personally reviewed and evaluated these images and lab results as part of my medical decision-making.   EKG Interpretation   Date/Time:  Tuesday April 26 2015 10:31:40 EST Ventricular Rate:  78 PR Interval:  175 QRS Duration: 82 QT Interval:  359 QTC Calculation: 409 R Axis:   17 Text Interpretation:  Sinus rhythm Abnormal R-wave progression, early  transition No significant change was found Confirmed by Galilea Quito  MD, Lennette Bihari  ([id number]) on 04/26/2015 12:01:08 PM      MDM   Final diagnoses:  Chest pain, unspecified chest pain type  Bronchospasm    Patient feels much better after bronchodilators here in the emergency department.  This is likely bronchitis with bronchospasm.  No signs of congestive heart failure.  EKG without ischemia.  Patient feels better this time.  Chest x-ray without pneumonia.  Discharge home with albuterol inhaler.    Jola Schmidt, MD 04/26/15 1210

## 2015-10-01 ENCOUNTER — Encounter (HOSPITAL_COMMUNITY): Payer: Self-pay | Admitting: *Deleted

## 2015-10-01 ENCOUNTER — Other Ambulatory Visit: Payer: Self-pay

## 2015-10-01 ENCOUNTER — Emergency Department (HOSPITAL_COMMUNITY)
Admission: EM | Admit: 2015-10-01 | Discharge: 2015-10-02 | Disposition: A | Discharge status: Home / Self Care | Payer: Self-pay | Attending: Emergency Medicine | Admitting: Emergency Medicine

## 2015-10-01 ENCOUNTER — Emergency Department (HOSPITAL_COMMUNITY): Payer: Self-pay

## 2015-10-01 DIAGNOSIS — R0609 Other forms of dyspnea: Secondary | ICD-10-CM

## 2015-10-01 DIAGNOSIS — R0600 Dyspnea, unspecified: Secondary | ICD-10-CM | POA: Insufficient documentation

## 2015-10-01 DIAGNOSIS — Z760 Encounter for issue of repeat prescription: Secondary | ICD-10-CM

## 2015-10-01 LAB — BASIC METABOLIC PANEL
ANION GAP: 14 (ref 5–15)
BUN: 7 mg/dL (ref 6–20)
CALCIUM: 9.6 mg/dL (ref 8.9–10.3)
CO2: 19 mmol/L — ABNORMAL LOW (ref 22–32)
CREATININE: 1.15 mg/dL (ref 0.61–1.24)
Chloride: 102 mmol/L (ref 101–111)
GFR calc Af Amer: >60 mL/min (ref >60)
GFR calc non Af Amer: >60 mL/min (ref >60)
Glucose, Bld: 103 mg/dL — ABNORMAL HIGH (ref 65–99)
POTASSIUM: 3.6 mmol/L (ref 3.5–5.1)
SODIUM: 135 mmol/L (ref 135–145)

## 2015-10-01 LAB — CBC
HCT: 41.8 % (ref 39.0–52.0)
Hemoglobin: 14 g/dL (ref 13.0–17.0)
MCH: 30.5 pg (ref 26.0–34.0)
MCHC: 33.5 g/dL (ref 30.0–36.0)
MCV: 91.1 fL (ref 78.0–100.0)
PLATELETS: 238 10*3/uL (ref 150–400)
RBC: 4.59 MIL/uL (ref 4.22–5.81)
RDW: 13.9 % (ref 11.5–15.5)
WBC: 7.6 10*3/uL (ref 4.0–10.5)

## 2015-10-01 LAB — I-STAT TROPONIN, ED: TROPONIN I, POC: 0 ng/mL (ref 0.00–0.08)

## 2015-10-01 MED ORDER — AMLODIPINE BESYLATE 5 MG PO TABS: 5.0000 mg | ORAL_TABLET | Freq: Every day | ORAL | Status: DC

## 2015-10-01 MED ORDER — ALBUTEROL SULFATE HFA 108 (90 BASE) MCG/ACT IN AERS
2.0000 | INHALATION_SPRAY | RESPIRATORY_TRACT | Status: DC | PRN
  Administered 2015-10-01: 2 via RESPIRATORY_TRACT
  Filled 2015-10-01: qty 6.7

## 2015-10-01 MED ORDER — AEROCHAMBER PLUS W/MASK MISC
1.0000 | Freq: Once | Status: AC
  Administered 2015-10-01: 1
  Filled 2015-10-01: qty 1

## 2015-10-01 NOTE — ED Notes (Signed)
Pt ambulated in the hall without difficulty. SpO2 at 98% room air.

## 2015-10-01 NOTE — ED Provider Notes (Signed)
CSN: [id number]     Arrival date & time 10/01/15  1838 History   First MD Initiated Contact with Patient 10/01/15 2218     Chief Complaint  Patient presents with  . Chest Pain  . Shortness of Breath     (Consider location/radiation/quality/duration/timing/severity/associated sxs/prior Treatment) HPI Comments: Patient states that today he noticed when he sits forward from a lying position and has some chest discomfort as dull, nonradiating bilateral upper chest that is relieved once he is in an upright position.  He states that he has no specific cardiac history does have a history of hypertension but ran out of his lisinopril.  He did take a dose of his mother's amlodipine today, and is awaiting mail-order refill, which is not due for the next 2-3 days.  He states he's had an episode like this one time in the past.  Around the same time the year.  He was given an inhaler with total resolution of his symptoms.  He does have a primary care doctor for follow-up  Patient is a 45 y.o. male presenting with chest pain and shortness of breath. The history is provided by the patient.  Chest Pain Chest pain location: Bilateral upper chest. Pain quality: dull and tightness   Pain quality: not radiating, not shooting and not tearing   Pain radiates to:  Does not radiate Pain radiates to the back: no   Pain severity:  Mild Onset quality:  Gradual Timing:  Intermittent Progression:  Unchanged Chronicity:  New Context: movement   Relieved by:  None tried Worsened by:  Movement (Sitting forward) Ineffective treatments:  None tried Associated symptoms: shortness of breath   Shortness of Breath Associated symptoms: chest pain     Past Medical History  Diagnosis Date  . Renal disorder   . Kidney calculi   . Chronic back pain   . Migraines   . Nephrolithiasis   . Congenital single kidney   . Nephrolithiasis    History reviewed. No pertinent past surgical history. No family history on  file. Social History  Substance Use Topics  . Smoking status: Never Smoker   . Smokeless tobacco: None  . Alcohol Use: Yes     Comment: 40 oz a day of beer    Review of Systems  Respiratory: Positive for shortness of breath.   Cardiovascular: Positive for chest pain.      Allergies  Diclofenac and Prednisone  Home Medications   Prior to Admission medications   Medication Sig Start Date End Date Taking? Authorizing Provider  acetaminophen (TYLENOL) 500 MG tablet Take 1,000 mg by mouth every 6 (six) hours as needed for headache.   Yes Historical Provider, MD  HYDROcodone-acetaminophen (NORCO/VICODIN) 5-325 MG tablet Take 2 tablets by mouth every 6 (six) hours as needed for moderate pain. 01/20/15  Yes Evelina Bucy, MD  omeprazole (PRILOSEC) 20 MG capsule Take 20 mg by mouth daily as needed (heart burn).   Yes Historical Provider, MD  amLODipine (NORVASC) 5 MG tablet Take 1 tablet (5 mg total) by mouth daily. 10/01/15 10/31/15  Junius Creamer, NP  azithromycin (ZITHROMAX) 250 MG tablet Take 1 tablet (250 mg total) by mouth daily. Take 1 every day until finished. You received her first dose in the ED. Patient not taking: Reported on 04/26/2015 04/21/15   Ottie Glazier, PA-C   BP 136/95 mmHg  Pulse 93  Temp(Src) 98.4 F (36.9 C) (Oral)  Resp 19  Ht 6\' 1"  (1.854 m)  Wt 87.232 kg  BMI 25.38 kg/m2  SpO2 97% Physical Exam  Constitutional: He is oriented to person, place, and time. He appears well-developed and well-nourished.  Eyes: Pupils are equal, round, and reactive to light.  Cardiovascular: Normal rate and regular rhythm.   Pulmonary/Chest: Effort normal and breath sounds normal. No respiratory distress. He has no wheezes. He has no rales. He exhibits no tenderness.  Patient is satting 100% at room air.  He does report with position changes.  He feels slightly short of breath and he does bump his pulse by 10 points with movement.  Musculoskeletal: Normal range of motion.   Neurological: He is alert and oriented to person, place, and time.  Skin: Skin is warm and dry.  Nursing note and vitals reviewed.   ED Course  Procedures (including critical care time) Labs Review Labs Reviewed  BASIC METABOLIC PANEL - Abnormal; Notable for the following:    CO2 19 (*)    Glucose, Bld 103 (*)    All other components within normal limits  CBC  I-STAT TROPOININ, ED    Imaging Review Dg Chest 2 View  10/01/2015  CLINICAL DATA:  Mid chest pain and headache for 1 day. Shortness of breath for 3 days. EXAM: CHEST  2 VIEW COMPARISON:  04/26/2015 FINDINGS: The cardiomediastinal contours are normal. There is a prominent left epicardial fat pad as seen on CT abdomen 08/19/2014. Left upper lobe scarring is unchanged. Pulmonary vasculature is normal. No consolidation, pleural effusion, or pneumothorax. No acute osseous abnormalities are seen. IMPRESSION: No active cardiopulmonary disease. Electronically Signed   By: Jeb Levering M.D.   On: 10/01/2015 19:14   I have personally reviewed and evaluated these images and lab results as part of my medical decision-making.   EKG Interpretation None     I reviewed the patient's labs, chest x-ray and EKG within normal parameters.  EKG is slightly rapid with a heart rate of 109, blood pressure is mildly elevated at 132/101.  He has been given a breathing treatment in the emergency department.  I plan is to discharge him home with the albuterol inhaler, a prescription for amlodipine and recommendation to follow-up with his primary care physician MDM   Final diagnoses:  Dyspnea on effort  Encounter for medication refill         Junius Creamer, NP 10/02/15 0005  Wandra Arthurs, MD 10/02/15 1558

## 2015-10-01 NOTE — Discharge Instructions (Signed)
As discussed today, your cardiac markers, EKG, chest x-ray and lab values are all within normal parameters .  You've been given an inhaler to use as needed for you're feeling of shortness of breath .  Please make an appointment with your primary care physician for follow-up use the inhaler as follows 1-2 puffs every 4-6 hours while awake as needed   Medicine Refill at the Emergency Department We have refilled your medicine today, but it is best for you to get refills through your primary health care provider's office. In the future, please plan ahead so you do not need to get refills from the emergency department. If the medicine we refilled was a maintenance medicine, you may have received only enough to get you by until you are able to see your regular health care provider.   This information is not intended to replace advice given to you by your health care provider. Make sure you discuss any questions you have with your health care provider.   Document Released: 07/20/2003 Document Revised: 04/23/2014 Document Reviewed: 07/10/2013 Elsevier Interactive Patient Education 2016 Elsevier Inc.  Shortness of Breath Shortness of breath means you have trouble breathing. It could also mean that you have a medical problem. You should get immediate medical care for shortness of breath. CAUSES   Not enough oxygen in the air such as with high altitudes or a smoke-filled room.  Certain lung diseases, infections, or problems.  Heart disease or conditions, such as angina or heart failure.  Low red blood cells (anemia).  Poor physical fitness, which can cause shortness of breath when you exercise.  Chest or back injuries or stiffness.  Being overweight.  Smoking.  Anxiety, which can make you feel like you are not getting enough air. DIAGNOSIS  Serious medical problems can often be found during your physical exam. Tests may also be done to determine why you are having shortness of breath. Tests  may include:  Chest X-rays.  Lung function tests.  Blood tests.  An electrocardiogram (ECG).  An ambulatory electrocardiogram. An ambulatory ECG records your heartbeat patterns over a 24-hour period.  Exercise testing.  A transthoracic echocardiogram (TTE). During echocardiography, sound waves are used to evaluate how blood flows through your heart.  A transesophageal echocardiogram (TEE).  Imaging scans. Your health care provider may not be able to find a cause for your shortness of breath after your exam. In this case, it is important to have a follow-up exam with your health care provider as directed.  TREATMENT  Treatment for shortness of breath depends on the cause of your symptoms and can vary greatly. HOME CARE INSTRUCTIONS   Do not smoke. Smoking is a common cause of shortness of breath. If you smoke, ask for help to quit.  Avoid being around chemicals or things that may bother your breathing, such as paint fumes and dust.  Rest as needed. Slowly resume your usual activities.  If medicines were prescribed, take them as directed for the full length of time directed. This includes oxygen and any inhaled medicines.  Keep all follow-up appointments as directed by your health care provider. SEEK MEDICAL CARE IF:   Your condition does not improve in the time expected.  You have a hard time doing your normal activities even with rest.  You have any new symptoms. SEEK IMMEDIATE MEDICAL CARE IF:   Your shortness of breath gets worse.  You feel light-headed, faint, or develop a cough not controlled with medicines.  You start coughing  up blood.  You have pain with breathing.  You have chest pain or pain in your arms, shoulders, or abdomen.  You have a fever.  You are unable to walk up stairs or exercise the way you normally do. MAKE SURE YOU:  Understand these instructions.  Will watch your condition.  Will get help right away if you are not doing well or get  worse.   This information is not intended to replace advice given to you by your health care provider. Make sure you discuss any questions you have with your health care provider.   Document Released: 12/26/2000 Document Revised: 04/07/2013 Document Reviewed: 06/18/2011 Elsevier Interactive Patient Education [employer].

## 2015-10-01 NOTE — ED Notes (Signed)
Pt c/o mid dull CP radiating to back onset last night with SOB, dizziness, pt reports running out of his Amlodipine 5 mg tabs but reports taking his mother's Amlodipine 5 mg tabs, pt denies n/v, pt reports vomiting x 2 today, denies diarrhea, A&O x4

## 2015-10-01 NOTE — ED Notes (Signed)
Reviewed the use of the aerochamber - voiced understanding

## 2015-10-02 NOTE — ED Notes (Signed)
Pt demonstrated appropriate use of inhaler and spacer.

## 2015-11-23 ENCOUNTER — Inpatient Hospital Stay (HOSPITAL_COMMUNITY)
Admission: EM | Admit: 2015-11-23 | Discharge: 2015-11-26 | DRG: 439 | Disposition: A | Discharge status: Home / Self Care | Payer: Self-pay | Attending: Family Medicine | Admitting: Family Medicine

## 2015-11-23 ENCOUNTER — Emergency Department (HOSPITAL_COMMUNITY): Payer: Self-pay

## 2015-11-23 ENCOUNTER — Encounter (HOSPITAL_COMMUNITY): Payer: Self-pay

## 2015-11-23 DIAGNOSIS — Z23 Encounter for immunization: Secondary | ICD-10-CM

## 2015-11-23 DIAGNOSIS — Q600 Renal agenesis, unilateral: Secondary | ICD-10-CM

## 2015-11-23 DIAGNOSIS — F1020 Alcohol dependence, uncomplicated: Secondary | ICD-10-CM | POA: Diagnosis present

## 2015-11-23 DIAGNOSIS — K861 Other chronic pancreatitis: Secondary | ICD-10-CM | POA: Diagnosis present

## 2015-11-23 DIAGNOSIS — E785 Hyperlipidemia, unspecified: Secondary | ICD-10-CM | POA: Diagnosis present

## 2015-11-23 DIAGNOSIS — E7800 Pure hypercholesterolemia, unspecified: Secondary | ICD-10-CM | POA: Diagnosis present

## 2015-11-23 DIAGNOSIS — Z87442 Personal history of urinary calculi: Secondary | ICD-10-CM

## 2015-11-23 DIAGNOSIS — E876 Hypokalemia: Secondary | ICD-10-CM | POA: Diagnosis present

## 2015-11-23 DIAGNOSIS — K859 Acute pancreatitis without necrosis or infection, unspecified: Secondary | ICD-10-CM

## 2015-11-23 DIAGNOSIS — I10 Essential (primary) hypertension: Secondary | ICD-10-CM

## 2015-11-23 DIAGNOSIS — Z800 Family history of malignant neoplasm of digestive organs: Secondary | ICD-10-CM

## 2015-11-23 DIAGNOSIS — K219 Gastro-esophageal reflux disease without esophagitis: Secondary | ICD-10-CM

## 2015-11-23 DIAGNOSIS — Z8249 Family history of ischemic heart disease and other diseases of the circulatory system: Secondary | ICD-10-CM

## 2015-11-23 DIAGNOSIS — K852 Alcohol induced acute pancreatitis without necrosis or infection: Principal | ICD-10-CM | POA: Diagnosis present

## 2015-11-23 DIAGNOSIS — F1010 Alcohol abuse, uncomplicated: Secondary | ICD-10-CM

## 2015-11-23 HISTORY — DX: Pure hypercholesterolemia, unspecified: E78.00

## 2015-11-23 HISTORY — DX: Unspecified chronic bronchitis: J42

## 2015-11-23 HISTORY — DX: Other chronic pain: G89.29

## 2015-11-23 HISTORY — DX: Essential (primary) hypertension: I10

## 2015-11-23 HISTORY — DX: Gastro-esophageal reflux disease without esophagitis: K21.9

## 2015-11-23 HISTORY — DX: Low back pain, unspecified: M54.50

## 2015-11-23 HISTORY — DX: Acute pancreatitis without necrosis or infection, unspecified: K85.90

## 2015-11-23 HISTORY — DX: Low back pain: M54.5

## 2015-11-23 HISTORY — DX: Pneumonia, unspecified organism: J18.9

## 2015-11-23 LAB — URINALYSIS, ROUTINE W REFLEX MICROSCOPIC
BILIRUBIN URINE: NEGATIVE
Glucose, UA: NEGATIVE mg/dL
Hgb urine dipstick: NEGATIVE
Ketones, ur: NEGATIVE mg/dL
Leukocytes, UA: NEGATIVE
NITRITE: NEGATIVE
PH: 5 (ref 5.0–8.0)
Protein, ur: NEGATIVE mg/dL
SPECIFIC GRAVITY, URINE: 1.022 (ref 1.005–1.030)

## 2015-11-23 LAB — COMPREHENSIVE METABOLIC PANEL
ALBUMIN: 4.1 g/dL (ref 3.5–5.0)
ALT: 40 U/L (ref 17–63)
ANION GAP: 12 (ref 5–15)
AST: 44 U/L — ABNORMAL HIGH (ref 15–41)
Alkaline Phosphatase: 80 U/L (ref 38–126)
BUN: 6 mg/dL (ref 6–20)
CO2: 23 mmol/L (ref 22–32)
Calcium: 9.4 mg/dL (ref 8.9–10.3)
Chloride: 104 mmol/L (ref 101–111)
Creatinine, Ser: 1.26 mg/dL — ABNORMAL HIGH (ref 0.61–1.24)
GFR calc Af Amer: >60 mL/min (ref >60)
GFR calc non Af Amer: >60 mL/min (ref >60)
GLUCOSE: 166 mg/dL — AB (ref 65–99)
POTASSIUM: 3.4 mmol/L — AB (ref 3.5–5.1)
SODIUM: 139 mmol/L (ref 135–145)
TOTAL PROTEIN: 7.6 g/dL (ref 6.5–8.1)
Total Bilirubin: 0.7 mg/dL (ref 0.3–1.2)

## 2015-11-23 LAB — LIPASE, BLOOD: LIPASE: 362 U/L — AB (ref 11–51)

## 2015-11-23 LAB — CBC
HEMATOCRIT: 43.9 % (ref 39.0–52.0)
HEMOGLOBIN: 14.7 g/dL (ref 13.0–17.0)
MCH: 31.5 pg (ref 26.0–34.0)
MCHC: 33.5 g/dL (ref 30.0–36.0)
MCV: 94.2 fL (ref 78.0–100.0)
Platelets: 233 10*3/uL (ref 150–400)
RBC: 4.66 MIL/uL (ref 4.22–5.81)
RDW: 13.9 % (ref 11.5–15.5)
WBC: 9.8 10*3/uL (ref 4.0–10.5)

## 2015-11-23 LAB — TRIGLYCERIDES: TRIGLYCERIDES: 494 mg/dL — AB (ref <150)

## 2015-11-23 MED ORDER — THIAMINE HCL 100 MG/ML IJ SOLN
100.0000 mg | Freq: Every day | INTRAMUSCULAR | Status: DC
  Administered 2015-11-23 – 2015-11-24 (×2): 100 mg via INTRAVENOUS
  Filled 2015-11-23 (×2): qty 2

## 2015-11-23 MED ORDER — VITAMIN B-1 100 MG PO TABS
100.0000 mg | ORAL_TABLET | Freq: Every day | ORAL | Status: DC
  Administered 2015-11-25 – 2015-11-26 (×2): 100 mg via ORAL
  Filled 2015-11-23 (×2): qty 1

## 2015-11-23 MED ORDER — ONDANSETRON HCL 4 MG/2ML IJ SOLN
4.0000 mg | Freq: Once | INTRAMUSCULAR | Status: AC
  Administered 2015-11-23: 4 mg via INTRAVENOUS
  Filled 2015-11-23: qty 2

## 2015-11-23 MED ORDER — SODIUM CHLORIDE 0.9 % IV SOLN
1000.0000 mL | INTRAVENOUS | Status: DC
  Administered 2015-11-23 (×2): 1000 mL via INTRAVENOUS

## 2015-11-23 MED ORDER — LORAZEPAM 2 MG/ML IJ SOLN: 1.0000 mg | Freq: Four times a day (QID) | INTRAMUSCULAR | Status: DC | PRN

## 2015-11-23 MED ORDER — PNEUMOCOCCAL VAC POLYVALENT 25 MCG/0.5ML IJ INJ
0.5000 mL | INJECTION | INTRAMUSCULAR | Status: AC
Start: 2015-11-24 — End: 2015-11-24
  Administered 2015-11-24: 0.5 mL via INTRAMUSCULAR
  Filled 2015-11-23: qty 0.5

## 2015-11-23 MED ORDER — ADULT MULTIVITAMIN W/MINERALS CH
1.0000 | ORAL_TABLET | Freq: Every day | ORAL | Status: DC
  Administered 2015-11-24 – 2015-11-26 (×3): 1 via ORAL
  Filled 2015-11-23 (×3): qty 1

## 2015-11-23 MED ORDER — HYDROMORPHONE HCL 1 MG/ML IJ SOLN
1.0000 mg | Freq: Once | INTRAMUSCULAR | Status: AC
  Administered 2015-11-23: 1 mg via INTRAVENOUS
  Filled 2015-11-23: qty 1

## 2015-11-23 MED ORDER — FOLIC ACID 1 MG PO TABS
1.0000 mg | ORAL_TABLET | Freq: Every day | ORAL | Status: DC
  Administered 2015-11-24 – 2015-11-26 (×3): 1 mg via ORAL
  Filled 2015-11-23 (×3): qty 1

## 2015-11-23 MED ORDER — HYDROMORPHONE HCL 1 MG/ML IJ SOLN
1.0000 mg | INTRAMUSCULAR | Status: DC | PRN
  Administered 2015-11-23 – 2015-11-25 (×15): 1 mg via INTRAVENOUS
  Filled 2015-11-23 (×15): qty 1

## 2015-11-23 MED ORDER — ENOXAPARIN SODIUM 40 MG/0.4ML ~~LOC~~ SOLN
40.0000 mg | SUBCUTANEOUS | Status: DC
Start: 2015-11-23 — End: 2015-11-26
  Administered 2015-11-23 – 2015-11-25 (×3): 40 mg via SUBCUTANEOUS
  Filled 2015-11-23 (×3): qty 0.4

## 2015-11-23 MED ORDER — FAMOTIDINE IN NACL 20-0.9 MG/50ML-% IV SOLN
20.0000 mg | Freq: Two times a day (BID) | INTRAVENOUS | Status: DC
  Administered 2015-11-23 – 2015-11-25 (×5): 20 mg via INTRAVENOUS
  Filled 2015-11-23 (×7): qty 50

## 2015-11-23 MED ORDER — SODIUM CHLORIDE 0.9 % IV SOLN
INTRAVENOUS | Status: DC
  Administered 2015-11-23: 1000 mL via INTRAVENOUS
  Administered 2015-11-24 (×2): via INTRAVENOUS

## 2015-11-23 MED ORDER — LORAZEPAM 1 MG PO TABS: 1.0000 mg | ORAL_TABLET | Freq: Four times a day (QID) | ORAL | Status: DC | PRN

## 2015-11-23 MED ORDER — SODIUM CHLORIDE 0.9 % IV SOLN
1000.0000 mL | Freq: Once | INTRAVENOUS | Status: AC
  Administered 2015-11-23: 1000 mL via INTRAVENOUS

## 2015-11-23 MED ORDER — POTASSIUM CHLORIDE 10 MEQ/100ML IV SOLN
10.0000 meq | INTRAVENOUS | Status: AC
  Administered 2015-11-23 (×2): 10 meq via INTRAVENOUS
  Filled 2015-11-23 (×2): qty 100

## 2015-11-23 MED ORDER — HYDROMORPHONE HCL 1 MG/ML IJ SOLN
1.0000 mg | INTRAMUSCULAR | Status: AC | PRN
  Administered 2015-11-23 (×3): 1 mg via INTRAVENOUS
  Filled 2015-11-23 (×3): qty 1

## 2015-11-23 MED ORDER — ONDANSETRON HCL 4 MG/2ML IJ SOLN
4.0000 mg | Freq: Three times a day (TID) | INTRAMUSCULAR | Status: DC | PRN
  Administered 2015-11-23 – 2015-11-24 (×2): 4 mg via INTRAVENOUS
  Filled 2015-11-23 (×2): qty 2

## 2015-11-23 NOTE — ED Notes (Signed)
1 liter NS hung for infusion.

## 2015-11-23 NOTE — ED Triage Notes (Signed)
Per Pt, Pt is coming from home with complaints of left sided abdominal pain with left and right flank pain. Stated it started this morning with nausea, vomiting, and diarrhea. Denies urinary symptoms.

## 2015-11-23 NOTE — ED Notes (Signed)
Report attempted 

## 2015-11-23 NOTE — ED Provider Notes (Signed)
[facility] DEPT Provider Note   CSN: [id number] Arrival date & time: 11/23/15  1111  First Provider Contact:  First MD Initiated Contact with Patient 11/23/15 1127        History   Chief Complaint Chief Complaint  Patient presents with  . Abdominal Pain  . Flank Pain    HPI Kyle Berry is a 44 y.o. male.  Patient presents to the emergency department complaints of severe abdominal and left flank pain and left mid back pain which began today.  He's had associated nausea vomiting.  He reports no lower abdominal pain or dysuria or urinary frequency.  Feels like her prior kidney stone for him.  He denies pain on the right side of his abdomen.  His pain is moderate to severe in severity.  Nothing worsens or improves his symptoms.  No fevers chills         Past Medical History:  Diagnosis Date  . Chronic back pain   . Congenital single kidney   . Kidney calculi   . Migraines   . Nephrolithiasis   . Nephrolithiasis   . Renal disorder     There are no active problems to display for this patient.   History reviewed. No pertinent surgical history.     Home Medications    Prior to Admission medications   Medication Sig Start Date End Date Taking? Authorizing Provider  acetaminophen (TYLENOL) 500 MG tablet Take 1,000 mg by mouth every 6 (six) hours as needed for headache.   Yes Historical Provider, MD  amLODipine (NORVASC) 5 MG tablet Take 1 tablet (5 mg total) by mouth daily. 10/01/15 11/23/15 Yes Junius Creamer, NP  omeprazole (PRILOSEC) 20 MG capsule Take 20 mg by mouth daily as needed (heart burn).   Yes Historical Provider, MD  azithromycin (ZITHROMAX) 250 MG tablet Take 1 tablet (250 mg total) by mouth daily. Take 1 every day until finished. You received her first dose in the ED. Patient not taking: Reported on 04/26/2015 04/21/15   Ottie Glazier, PA-C    Family History No family history on file.  Social History Social History  Substance Use Topics  .  Smoking status: Never Smoker  . Smokeless tobacco: Never Used  . Alcohol use Yes     Comment: 40 oz a day of beer     Allergies   Diclofenac and Prednisone   Review of Systems Review of Systems  All other systems reviewed and are negative.    Physical Exam Updated Vital Signs BP 138/91   Pulse 97   Temp 99.4 F (37.4 C) (Oral)   Resp 18   Ht 6\' 1"  (1.854 m)   Wt 195 lb (88.5 kg)   SpO2 99%   BMI 25.73 kg/m   Physical Exam  Constitutional: He is oriented to person, place, and time. He appears well-developed and well-nourished.  HENT:  Head: Normocephalic and atraumatic.  Eyes: EOM are normal.  Neck: Normal range of motion.  Cardiovascular: Normal rate, regular rhythm, normal heart sounds and intact distal pulses.   Pulmonary/Chest: Effort normal and breath sounds normal. No respiratory distress.  Abdominal: Soft. He exhibits no distension.  Epigastric and left flank tenderness  Musculoskeletal: Normal range of motion.  Neurological: He is alert and oriented to person, place, and time.  Skin: Skin is warm and dry.  Psychiatric: He has a normal mood and affect. Judgment normal.  Nursing note and vitals reviewed.    ED Treatments / Results  Labs (all labs  ordered are listed, but only abnormal results are displayed) Labs Reviewed  LIPASE, BLOOD - Abnormal; Notable for the following:       Result Value   Lipase 362 (*)    All other components within normal limits  COMPREHENSIVE METABOLIC PANEL - Abnormal; Notable for the following:    Potassium 3.4 (*)    Glucose, Bld 166 (*)    Creatinine, Ser 1.26 (*)    AST 44 (*)    All other components within normal limits  URINALYSIS, ROUTINE W REFLEX MICROSCOPIC (NOT AT [facility]) - Abnormal; Notable for the following:    APPearance HAZY (*)    All other components within normal limits  CBC    EKG  EKG Interpretation None       Radiology Ct Renal Stone Study  Result Date: 11/23/2015 CLINICAL DATA:  45 year old  male with left flank pain nausea and vomiting. History of kidney stones. Initial encounter. EXAM: CT ABDOMEN AND PELVIS WITHOUT CONTRAST TECHNIQUE: Multidetector CT imaging of the abdomen and pelvis was performed following the standard protocol without IV contrast. COMPARISON:  08/19/2014 CT.  01/20/2015 ultrasound. FINDINGS: Lower chest: Minimal atelectasis/scarring right middle lobe and right lung base. Hepatobiliary: Taking into account limitation by non contrast imaging, no focal hepatic lesion. No calcified gallstones. No calcified common bile duct stone. Pancreas: Diffuse inflammation surrounds the pancreatic head and neck region consistent with pancreatitis. Taking into account limitation by non contrast imaging, no underlying pancreatic mass identified. No well-defined drainable abscess. Spleen: Within normal limits in size. Adrenals/Urinary Tract: Left lower pole 2 mm and 5 mm nonobstructing stone. No evidence of left-sided hydronephrosis. Taking into account limitation by non contrast imaging, no left renal mass noted. Diminutive size right kidney once again noted with small cyst. No adrenal lesion. Stomach/Bowel: Fluid surrounds the posterior border stomach and duodenum secondary to pancreatitis however, no primary bowel inflammatory process noted. Fluid extends along the anterior margin of the left para renal space. Vascular/Lymphatic: No abdominal aortic aneurysm. Small number of upper abdominal lymph nodes appear reactive in origin. Reproductive: Negative. Other: Negative. Musculoskeletal: No suspicious bone lesions identified. Degenerative changes L5-S1. IMPRESSION: Findings consistent with pancreatitis. Fluid surrounds the posterior border of the stomach, duodenum and courses along the anterior margin of the left para renal space. Left lower pole 2 mm and 5 mm nonobstructing stone. No evidence of left-sided hydronephrosis. Diminutive size right kidney once again noted with small cyst. Electronically  Signed   By: Genia Del M.D.   On: 11/23/2015 12:44    Procedures Procedures (including critical care time)  Medications Ordered in ED Medications  HYDROmorphone (DILAUDID) injection 1 mg (1 mg Intravenous Given 11/23/15 1313)  ondansetron (ZOFRAN) injection 4 mg (4 mg Intravenous Given 11/23/15 1150)     Initial Impression / Assessment and Plan / ED Course  I have reviewed the triage vital signs and the nursing notes.  Pertinent labs & imaging results that were available during my care of the patient were reviewed by me and considered in my medical decision making (see chart for details).  Clinical Course    Acute pancreatitis.  This is first time for this patient.  He does drink beer daily.  This is likely alcohol induced.  He has no right upper quadrant pain.  CT scan confirms inflammation around the pancreas itself in his lipase is 360 in the ER.  Patient be admitted for IV fluids and symptomatic care  Final Clinical Impressions(s) / ED Diagnoses   Final diagnoses:  Acute pancreatitis, unspecified complication status, unspecified pancreatitis type    New Prescriptions New Prescriptions   No medications on file     Jola Schmidt, MD 11/23/15 1427

## 2015-11-23 NOTE — H&P (Signed)
[facility] Admission History and Physical Service Pager: [phone]  Patient name: Kyle Berry Medical record number: [id number] Date of birth: 31-May-1970 Age: 45 y.o. Gender: male  Primary Care Provider: Pcp Not In System Consultants: None  Code Status: FULL  Chief Complaint: Abdominal Pain  Assessment and Plan: Kyle Berry is a 45 y.o. male presenting with Left flank and epigastric pain radiating to the back, found to have pancreatitis . PMH is significant for HTN, nephrolithiasis, and congenital single kidney.  #Pancreatitis: Patient with left upper abdominal pain, n/v; epigastric tenderness to palpation and lipase elevated to 362, also with CT renal study positive for diffuse inflammation surrounding the pancreatic head and neck region. Patient without fever or leukocytosis in the ED.  No RUQ pain, negative murphy sign, and Alk Phos/LFTs were unremarkable. Vitals are stable and patient afebrile. Possibly due to alcohol use as pt consumes 3 12-16oz cans of beer a day. Calcium was normal at 9.4. CT renal stone study without gall stones. No trauma to abdomen.  - admit to med-surg, attending Dr. Gwendlyn Deutscher  - NPO  - Pain management with dilaudid 762m q 2hr PRN - IV fluid - normal saline at 125 mL/hr - Zofran PRN for nausea - TAG level - IV Pepcid for bowel protection   #Alcohol use - patient endorses ~40oz beers per day, has never gone through withdrawal. Last drink last night.  - monitor on CIWA  # Mild Hypokalemia: likely due to vomiting at home.  - replete with IV K 1106m x 2  #HTN - stable.  Patient normotensive in the emergency room.  Home amlodipine held. - Consider restarting home amlodipine 62m61mn the morning  #history of GERD - patient with epigastric pain, though this is most likely due to pancreatitis.   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 Patient denies HA or diplopia recently. Reports he was told to follow up with neurology but has not been able to yet.   FEN/GI: NPO, will advance as tolerated Prophylaxis: lovenox  Disposition: Discharge home when patient clinically improved and able to   History of Present Illness:  RegJOSIE Berry a 45 63o. male presenting with sharp left flank and epigastric pain radiating to the back that began this morning. The patient woke up and ate chicken for breakfast, and the pain came on suddenly a little while later. Initially the pain was intermittent in nature, however worsened over the next few hours and is now constant.  He endorses nausea with two episodes of non-bloody emesis associated with the pain.  He denies fevers or chills.  Initially he thought his symptoms were recurrence of kidney stone, but denies hematuria  increased frequency or dysuria. He notes intermittent loose stools over the last week, however now resolved, has been without melena or hematochezia.  No dyspnea, chest pain, recent fevers, chills or weight loss.  No constipation. Patient denies any surgical history.    Review Of Systems: Per HPI.  Otherwise the remainder of the systems were negative.  Patient Active Problem List   Diagnosis Date Noted  . Pancreatitis 11/23/2015    Past Medical History: Past Medical History:  Diagnosis Date  . Chronic back pain   . Congenital single kidney   . Kidney calculi   . Migraines   .  Nephrolithiasis   . Nephrolithiasis   . Renal disorder     Past Surgical History: History reviewed. No pertinent surgical history.  Social History: Social History  Substance Use Topics  . Smoking status: Never Smoker  . Smokeless tobacco: Never Used  . Alcohol use Yes     Comment: 40 oz a day of beer   Additional social history: Denies recreational drug use. Please also refer to relevant  sections of EMR.  Family History: Maternal grandmother died of pancreatic cancer. HTN  Allergies and Medications: Allergies  Allergen Reactions  . Diclofenac Hives  . Prednisone Other (See Comments)    Mood swings   No current facility-administered medications on file prior to encounter.    Current Outpatient Prescriptions on File Prior to Encounter  Medication Sig Dispense Refill  . acetaminophen (TYLENOL) 500 MG tablet Take 1,000 mg by mouth every 6 (six) hours as needed for headache.    Marland Kitchen amLODipine (NORVASC) 5 MG tablet Take 1 tablet (5 mg total) by mouth daily. 30 tablet 0  . omeprazole (PRILOSEC) 20 MG capsule Take 20 mg by mouth daily as needed (heart burn).    Marland Kitchen azithromycin (ZITHROMAX) 250 MG tablet Take 1 tablet (250 mg total) by mouth daily. Take 1 every day until finished. You received her first dose in the ED. (Patient not taking: Reported on 04/26/2015) 4 tablet 0  . [DISCONTINUED] Ibuprofen-Diphenhydramine HCl (ADVIL PM) 200-25 MG CAPS Take 1 tablet by mouth at bedtime as needed (sleep).      Objective: BP 133/91   Pulse 98   Temp 99.4 F (37.4 C) (Oral)   Resp 21   Ht 6' 1"  (1.854 m)   Wt 88.5 kg (195 lb)   SpO2 96%   BMI 25.73 kg/m  Exam: General: Patient lies in bed comfortably, appears stated age, no apparent distress. Eyes: PERRL, EOMI, no conjunctival erythema or exudate ENTM: MMM, no rhinorrhea or congestion, no pharyngeal erythema or exudate Neck: full ROM, no thyromegally Cardiovascular: mildly tachycardic with regular rhythm, no M/R/G Respiratory: clear to auscultation bilaterally without W/R/R Abdomen: +mild epigastric and left flank tenderness, no rebound or guarding, no CVA tenderness, nondistended, normoactive BS MSK: full ROM in 4 extremities, no lower extremity edema Skin: no rashes or lesions, no jaundice Neuro: CNII-XII grossly intact Psych: AAOx3  Labs and Imaging: CBC BMET   Recent Labs Lab 11/23/15 1118  WBC 9.8  HGB 14.7  HCT  43.9  PLT 233    Recent Labs Lab 11/23/15 1118  NA 139  K 3.4*  CL 104  CO2 23  BUN 6  CREATININE 1.26*  GLUCOSE 166*  CALCIUM 9.4     Ct Renal Stone Study  Result Date: 11/23/2015 CLINICAL DATA:  45 year old male with left flank pain nausea and vomiting. History of kidney stones. Initial encounter. EXAM: CT ABDOMEN AND PELVIS WITHOUT CONTRAST TECHNIQUE: Multidetector CT imaging of the abdomen and pelvis was performed following the standard protocol without IV contrast. COMPARISON:  08/19/2014 CT.  01/20/2015 ultrasound. FINDINGS: Lower chest: Minimal atelectasis/scarring right middle lobe and right lung base. Hepatobiliary: Taking into account limitation by non contrast imaging, no focal hepatic lesion. No calcified gallstones. No calcified common bile duct stone. Pancreas: Diffuse inflammation surrounds the pancreatic head and neck region consistent with pancreatitis. Taking into account limitation by non contrast imaging, no underlying pancreatic mass identified. No well-defined drainable abscess. Spleen: Within normal limits in size. Adrenals/Urinary Tract: Left lower pole 2 mm and 5 mm nonobstructing stone. No  evidence of left-sided hydronephrosis. Taking into account limitation by non contrast imaging, no left renal mass noted. Diminutive size right kidney once again noted with small cyst. No adrenal lesion. Stomach/Bowel: Fluid surrounds the posterior border stomach and duodenum secondary to pancreatitis however, no primary bowel inflammatory process noted. Fluid extends along the anterior margin of the left para renal space. Vascular/Lymphatic: No abdominal aortic aneurysm. Small number of upper abdominal lymph nodes appear reactive in origin. Reproductive: Negative. Other: Negative. Musculoskeletal: No suspicious bone lesions identified. Degenerative changes L5-S1. IMPRESSION: Findings consistent with pancreatitis. Fluid surrounds the posterior border of the stomach, duodenum and courses  along the anterior margin of the left para renal space. Left lower pole 2 mm and 5 mm nonobstructing stone. No evidence of left-sided hydronephrosis. Diminutive size right kidney once again noted with small cyst. Electronically Signed   By: Genia Del M.D.   On: 11/23/2015 12:44    Everrett Coombe, MD  11/23/2015, 4:58 PM PGY-1, [place] Intern pager: [phone], text pages welcome  UPPER LEVEL ADDENDUM  I have read the above note and made revisions highlighted in blue.  Smiley Houseman, MD PGY-2 [facility] Pager [phone]

## 2015-11-24 DIAGNOSIS — K852 Alcohol induced acute pancreatitis without necrosis or infection: Principal | ICD-10-CM

## 2015-11-24 DIAGNOSIS — I10 Essential (primary) hypertension: Secondary | ICD-10-CM

## 2015-11-24 DIAGNOSIS — K219 Gastro-esophageal reflux disease without esophagitis: Secondary | ICD-10-CM

## 2015-11-24 LAB — CBC
HEMATOCRIT: 39.6 % (ref 39.0–52.0)
HEMOGLOBIN: 12.9 g/dL — AB (ref 13.0–17.0)
MCH: 31.5 pg (ref 26.0–34.0)
MCHC: 32.6 g/dL (ref 30.0–36.0)
MCV: 96.8 fL (ref 78.0–100.0)
Platelets: 210 10*3/uL (ref 150–400)
RBC: 4.09 MIL/uL — AB (ref 4.22–5.81)
RDW: 14 % (ref 11.5–15.5)
WBC: 10 10*3/uL (ref 4.0–10.5)

## 2015-11-24 LAB — COMPREHENSIVE METABOLIC PANEL
ALT: 30 U/L (ref 17–63)
ANION GAP: 10 (ref 5–15)
AST: 26 U/L (ref 15–41)
Albumin: 3.5 g/dL (ref 3.5–5.0)
Alkaline Phosphatase: 59 U/L (ref 38–126)
BUN: 5 mg/dL — ABNORMAL LOW (ref 6–20)
CHLORIDE: 105 mmol/L (ref 101–111)
CO2: 24 mmol/L (ref 22–32)
Calcium: 8.7 mg/dL — ABNORMAL LOW (ref 8.9–10.3)
Creatinine, Ser: 1.02 mg/dL (ref 0.61–1.24)
GFR calc non Af Amer: >60 mL/min (ref >60)
Glucose, Bld: 123 mg/dL — ABNORMAL HIGH (ref 65–99)
POTASSIUM: 3.6 mmol/L (ref 3.5–5.1)
SODIUM: 139 mmol/L (ref 135–145)
Total Bilirubin: 0.9 mg/dL (ref 0.3–1.2)
Total Protein: 6.4 g/dL — ABNORMAL LOW (ref 6.5–8.1)

## 2015-11-24 NOTE — Care Management Note (Signed)
Case Management Note  Patient Details  Name: Kyle Berry MRN: [id number] Date of Birth: 05/30/1970  Subjective/Objective:                Pt presents with pain radiating to the back, found to have pancreatitis. PMH is significant for HTN, nephrolithiasis, and congenital single kidney. Lives with parents. Independent with ADL's, no DME usage. Prescriptions are filled @ [facility] on [address]  PCP: [facility]    Action/Plan: Return home when medically stable. CM to f/u with disposition needs.  Expected Discharge Date:                  Expected Discharge Plan:  Home/Self Care  In-House Referral:     Discharge planning Services  CM Consult  Post Acute Care Choice:    Choice offered to:     DME Arranged:    DME Agency:     HH Arranged:    HH Agency:     Status of Service:  In process, will continue to follow  If discussed at Long Length of Stay Meetings, dates discussed:    Additional Comments:  Sharin Mons, RN 11/24/2015, 2:48 PM

## 2015-11-24 NOTE — Progress Notes (Signed)
Family Medicine Teaching Service Daily Progress Note Intern Pager: [phone]  Patient name: Kyle Berry Medical record number: [id number] Date of birth: 12-Nov-1970 Age: 45 y.o. Gender: male  Primary Care Provider: [facility] Consultants: None Code Status: FULL  Pt Overview and Major Events to Date:  8/9 Admitted for pancreatitis  Assessment and Plan: Kyle Berry is a 45 y.o. male presenting with Left flank and epigastric pain radiating to the back, found to have pancreatitis . PMH is significant for HTN, nephrolithiasis, and congenital single kidney.  Pancreatitis: likely 2/2 EtOH abuse. Continued LUQ abd pain, n/v; epigastric tenderness to palpation and lipase elevated to 362, also with CT renal study positive for diffuse inflammation surrounding the pancreatic head and neck region. Alk Phos/LFTs unremarkable and CT renal stone study without gall stones. VSS, afebrile, no leukocytosis. Consumes 3 12-16oz cans of beer a day. Calcium normal at 9.4 and TG 494.  No h/o trauma to abdomen.  - NPO with ice chips today, ADAT - Pain management with dilaudid 74m q 2hr PRN - NS@ 125 - Zofran PRN for nausea - IV Pepcid for bowel protection   Alcohol use - patient endorses ~40oz beers per day, has never gone through withdrawal. Last drink on 11/22/15. - monitor on CIWA  Mild Hypokalemia, resolved: On admit K 3.2 likely due to vomiting at home, now K 3.6 S/p repletion with IV K 134m x 2 - monitor BMP  HTN - stable, currently BP 126/35. - Home amlodipine held d/t patient normotensive - Consider restarting home amlodipine 3m49mf BP allows  history of GERD - patient with epigastric pain, though this is most likely due to pancreatitis.   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  Patient denies HA or diplopia recently. Reports he was told to follow up with neurology but has not been able to yet.   FEN/GI: NS _0 . NPO with ice chips, will advance as tolerated Prophylaxis: lovenox  Disposition: Discharge home when patient clinically improved and able to   Subjective:  States feels better today. No episodes of emesis this hospital stay and nausea has resolved. Has continued midepigastric pain radiating to his back that is controlled on medications but returns when pain medication wears off. Is wanting to try ice chips today. States is ready to give up alcohol completely on discharge.  Objective: Temp:  [98.4 F (36.9 C)-99.4 F (37.4 C)] 98.4 F (36.9 C) (08/10 0521) Pulse Rate:  [78-109] 78 (08/10 0521) Resp:  [15-25] 18 (08/10 0521) BP: (126-151)/(83-100) 126/85 (08/10 0521) SpO2:  [92 %-100 %] 94 % (08/10 0521) Weight:  [194 lb 4.8 oz (88.1 kg)-195 lb (88.5 kg)] 194 lb 4.8 oz (88.1 kg) (08/09 1804) Physical Exam: General: lying in bed, in NAD Cardiovascular: RRR, no murmurs noted Respiratory: CTAB, normal effort Abdomen: +mild epigastric tenderness, mild guarding but no reboundnondistended, normoactive BS Extremities: full ROM in 4 extremities, no lower extremity edema  Laboratory:  Recent Labs Lab 11/23/15 1118  WBC 9.8  HGB 14.7  HCT 43.9  PLT 233    Recent Labs Lab 11/23/15 1118  NA 139  K 3.4*  CL 104  CO2 23  BUN 6  CREATININE 1.26*  CALCIUM 9.4  PROT 7.6  BILITOT 0.7  ALKPHOS 80  ALT 40  AST 44*  GLUCOSE 166*  Triglycerides 494  Imaging/Diagnostic Tests: Ct Renal Stone Study  Result Date: 11/23/2015 CLINICAL DATA:  45 year old male with left flank pain nausea and vomiting. History of kidney stones. Initial encounter. EXAM: CT ABDOMEN AND PELVIS WITHOUT CONTRAST TECHNIQUE: Multidetector CT imaging of the abdomen and pelvis was performed following the standard protocol without IV contrast. COMPARISON:  08/19/2014 CT.   01/20/2015 ultrasound. FINDINGS: Lower chest: Minimal atelectasis/scarring right middle lobe and right lung base. Hepatobiliary: Taking into account limitation by non contrast imaging, no focal hepatic lesion. No calcified gallstones. No calcified common bile duct stone. Pancreas: Diffuse inflammation surrounds the pancreatic head and neck region consistent with pancreatitis. Taking into account limitation by non contrast imaging, no underlying pancreatic mass identified. No well-defined drainable abscess. Spleen: Within normal limits in size. Adrenals/Urinary Tract: Left lower pole 2 mm and 5 mm nonobstructing stone. No evidence of left-sided hydronephrosis. Taking into account limitation by non contrast imaging, no left renal mass noted. Diminutive size right kidney once again noted with small cyst. No adrenal lesion. Stomach/Bowel: Fluid surrounds the posterior border stomach and duodenum secondary to pancreatitis however, no primary bowel inflammatory process noted. Fluid extends along the anterior margin of the left para renal space. Vascular/Lymphatic: No abdominal aortic aneurysm. Small number of upper abdominal lymph nodes appear reactive in origin. Reproductive: Negative. Other: Negative. Musculoskeletal: No suspicious bone lesions identified. Degenerative changes L5-S1. IMPRESSION: Findings consistent with pancreatitis. Fluid surrounds the posterior border of the stomach, duodenum and courses along the anterior margin of the left para renal space. Left lower pole 2 mm and 5 mm nonobstructing stone. No evidence of left-sided hydronephrosis. Diminutive size right kidney once again noted with small cyst. Electronically Signed   By: Genia Del M.D.   On: 11/23/2015 12:44    Bufford Lope, DO 11/24/2015, 7:01 AM PGY-1, [place] Intern pager: [phone], text pages welcome

## 2015-11-25 LAB — CBC
HCT: 37.1 % — ABNORMAL LOW (ref 39.0–52.0)
Hemoglobin: 11.9 g/dL — ABNORMAL LOW (ref 13.0–17.0)
MCH: 30.7 pg (ref 26.0–34.0)
MCHC: 32.1 g/dL (ref 30.0–36.0)
MCV: 95.9 fL (ref 78.0–100.0)
PLATELETS: 209 10*3/uL (ref 150–400)
RBC: 3.87 MIL/uL — AB (ref 4.22–5.81)
RDW: 14 % (ref 11.5–15.5)
WBC: 10.4 10*3/uL (ref 4.0–10.5)

## 2015-11-25 LAB — COMPREHENSIVE METABOLIC PANEL
ALT: 22 U/L (ref 17–63)
AST: 22 U/L (ref 15–41)
Albumin: 3.2 g/dL — ABNORMAL LOW (ref 3.5–5.0)
Alkaline Phosphatase: 59 U/L (ref 38–126)
Anion gap: 10 (ref 5–15)
CHLORIDE: 102 mmol/L (ref 101–111)
CO2: 24 mmol/L (ref 22–32)
CREATININE: 1.06 mg/dL (ref 0.61–1.24)
Calcium: 8.5 mg/dL — ABNORMAL LOW (ref 8.9–10.3)
GFR calc Af Amer: >60 mL/min (ref >60)
Glucose, Bld: 92 mg/dL (ref 65–99)
Potassium: 3.4 mmol/L — ABNORMAL LOW (ref 3.5–5.1)
Sodium: 136 mmol/L (ref 135–145)
Total Bilirubin: 1.2 mg/dL (ref 0.3–1.2)
Total Protein: 6 g/dL — ABNORMAL LOW (ref 6.5–8.1)

## 2015-11-25 MED ORDER — HYDROMORPHONE HCL 1 MG/ML IJ SOLN
0.5000 mg | INTRAMUSCULAR | Status: DC | PRN
  Administered 2015-11-25 – 2015-11-26 (×5): 0.5 mg via INTRAVENOUS
  Filled 2015-11-25 (×6): qty 1

## 2015-11-25 NOTE — Progress Notes (Signed)
Family Medicine Teaching Service Daily Progress Note Intern Pager: [phone]  Patient name: Kyle Berry Medical record number: [id number] Date of birth: Sep 11, 1970 Age: 45 y.o. Gender: male  Primary Care Provider: [facility] Consultants: None Code Status: FULL  Pt Overview and Major Events to Date:  8/9 Admitted for pancreatitis  Assessment and Plan: Kyle Berry is a 45 y.o. male presenting with Left flank and epigastric pain radiating to the back, found to have pancreatitis . PMH is significant for HTN, nephrolithiasis, and congenital single kidney.  Pancreatitis: likely 2/2 EtOH abuse. CT renal study positive for diffuse inflammation surrounding the pancreatic head and neck region. Alk Phos/LFTs unremarkable and CT renal stone study without gall stones. VSS, afebrile, no leukocytosis. Consumes 3 12-16oz cans of beer a day. Calcium normal at 9.4 and TG 494.  Continued epigastric tenderness to palpation but no n/v. - Clear liquid diet, ADAT - Pain management with dilaudid 0.27m q4hr PRN - Zofran PRN for nausea - IV Pepcid for bowel protection   Alcohol use - patient endorses ~40oz beers per day, has never gone through withdrawal. Last drink on 11/22/15. Scoring 0 on CIWA - monitor on CIWA   Mild Hypokalemia, resolved. - monitor BMP  HTN - stable, currently BP 134/82. - Home amlodipine held d/t patient normotensive - Consider restarting home amlodipine 519mif BP allows  history of GERD - patient with epigastric pain, though this is most likely due to pancreatitis.   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 Patient denies HA or diplopia recently. Reports he was told to follow up with neurology but has not been able to yet.   FEN/GI: Clear liquid diet ADAT,  pepcid Prophylaxis: lovenox  Disposition: Discharge home when patient clinically improved and able to   Subjective:  States feels better today with continued midepigastric pain radiating to his back that is improved from yesterday. Has not yet tried clear liquids since abd pain was worse yesterday, is okay trying clear liquids this morning with full liquids this afternoon. Feels hungry.  Objective: Temp:  [97.9 F (36.6 C)-99.2 F (37.3 C)] 99.2 F (37.3 C) (08/11 0529) Pulse Rate:  [79-85] 85 (08/11 0529) Resp:  [16-19] 19 (08/11 0529) BP: (118-136)/(80-91) 134/82 (08/11 0529) SpO2:  [91 %-96 %] 96 % (08/11 0529) Physical Exam: General: sitting up in bed, in NAD Cardiovascular: RRR, no murmurs noted Respiratory: CTAB, normal effort Abdomen: +mild epigastric tenderness. nondistended, normoactive BS, no rebound or guarding Extremities: full ROM in 4 extremities, no lower extremity edema  Laboratory:  Recent Labs Lab 11/23/15 1118 11/24/15 0536 11/25/15 0431  WBC 9.8 10.0 10.4  HGB 14.7 12.9* 11.9*  HCT 43.9 39.6 37.1*  PLT 233 210 209    Recent Labs Lab 11/23/15 1118 11/24/15 0536 11/25/15 0431  NA 139 139 136  K 3.4* 3.6 3.4*  CL 104 105 102  CO2 23 24 24   BUN 6 5* <5*  CREATININE 1.26* 1.02 1.06  CALCIUM 9.4 8.7* 8.5*  PROT 7.6 6.4* 6.0*  BILITOT 0.7 0.9 1.2  ALKPHOS 80 59 59  ALT 40 30 22  AST 44* 26 22  GLUCOSE 166* 123* 92   Triglycerides 494  Imaging/Diagnostic Tests: Ct Renal Stone Study  Result Date: 11/23/2015 CLINICAL DATA:  [age]ear old male with left flank pain nausea and  vomiting. History of kidney stones. Initial encounter. EXAM: CT ABDOMEN AND PELVIS WITHOUT CONTRAST TECHNIQUE: Multidetector CT imaging of the abdomen and pelvis was performed following the standard protocol without IV contrast. COMPARISON:  08/19/2014 CT.  01/20/2015 ultrasound. FINDINGS: Lower chest: Minimal atelectasis/scarring right middle lobe and right lung base.  Hepatobiliary: Taking into account limitation by non contrast imaging, no focal hepatic lesion. No calcified gallstones. No calcified common bile duct stone. Pancreas: Diffuse inflammation surrounds the pancreatic head and neck region consistent with pancreatitis. Taking into account limitation by non contrast imaging, no underlying pancreatic mass identified. No well-defined drainable abscess. Spleen: Within normal limits in size. Adrenals/Urinary Tract: Left lower pole 2 mm and 5 mm nonobstructing stone. No evidence of left-sided hydronephrosis. Taking into account limitation by non contrast imaging, no left renal mass noted. Diminutive size right kidney once again noted with small cyst. No adrenal lesion. Stomach/Bowel: Fluid surrounds the posterior border stomach and duodenum secondary to pancreatitis however, no primary bowel inflammatory process noted. Fluid extends along the anterior margin of the left para renal space. Vascular/Lymphatic: No abdominal aortic aneurysm. Small number of upper abdominal lymph nodes appear reactive in origin. Reproductive: Negative. Other: Negative. Musculoskeletal: No suspicious bone lesions identified. Degenerative changes L5-S1. IMPRESSION: Findings consistent with pancreatitis. Fluid surrounds the posterior border of the stomach, duodenum and courses along the anterior margin of the left para renal space. Left lower pole 2 mm and 5 mm nonobstructing stone. No evidence of left-sided hydronephrosis. Diminutive size right kidney once again noted with small cyst. Electronically Signed   By: Genia Del M.D.   On: 11/23/2015 12:44    Bufford Lope, DO 11/25/2015, 6:38 AM PGY-1, [place] Intern pager: [phone], text pages welcome

## 2015-11-25 NOTE — Discharge Summary (Signed)
[facility] Discharge Summary  Patient name: Kyle Berry Medical record number: [id number] Date of birth: Feb 10, 1971 Age: 45 y.o. 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 PMH is significant for HTN, nephrolithiasis, and congenital single kidney.  Acute Pancreatitis 2/2 EtOH abuse Patient presented with sharp left flank and epigastric pain radiating to the back that began the morningpf presentation after eating breakfast. His mid epigastric pain was initially intermittent, however it worsened and he had nausea with two episodes of non-bloody emesis. Patient has a history of alcohol use: 3 cans of 12-16oz beers daily, last drink was on 11/22/15. In the ED, his lipase was elevated to 362 and CT renal study was positive for diffuse inflammation surrounding the pancreatic head and neck region. He was kept on bowel rest with IV hydration and he improved clinically. His diet was advanced as tolerated and patient stated he would refrain from drinking alcohol.  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 Patient denies HA or diplopia recently. Reports he was told to follow up with neurology but has not been able to yet.   Issues for Follow Up:  1. Please follow up on midepigastric abdominal pain s/p acute pancreatitis 2. Please have patient follow up with neurology as planned.  Significant Procedures: none  Significant Labs and Imaging:   Recent Labs Lab 11/23/15 1118 11/24/15 0536 11/25/15 0431  WBC 9.8 10.0 10.4  HGB 14.7 12.9* 11.9*  HCT 43.9 39.6 37.1*  PLT 233 210 209    Recent Labs Lab 11/23/15 1118 11/24/15 0536 11/25/15 0431 11/26/15 0802  NA 139 139 136 135  K 3.4* 3.6 3.4* 3.4*  CL 104 105 102 101  CO2 23 24 24  21*  GLUCOSE 166* 123* 92 83  BUN 6 5* <5* 6  CREATININE 1.26* 1.02 1.06 1.19  CALCIUM 9.4 8.7* 8.5* 8.7*  ALKPHOS 80 59 59  --   AST 44* 26 22  --   ALT 40 30 22  --   ALBUMIN 4.1 3.5 3.2*  --    Ct Renal Stone Study  Result Date: 11/23/2015 CLINICAL DATA:  45 year old male with left flank pain nausea and vomiting. History of kidney stones. Initial encounter. EXAM: CT ABDOMEN AND PELVIS WITHOUT CONTRAST TECHNIQUE: Multidetector CT imaging of the abdomen and pelvis was performed following the standard protocol without IV contrast. COMPARISON:  08/19/2014 CT.  01/20/2015 ultrasound. FINDINGS: Lower chest: Minimal atelectasis/scarring right middle lobe and right lung base. Hepatobiliary: Taking into account limitation by non contrast imaging, no focal hepatic lesion. No calcified gallstones. No calcified common bile duct stone. Pancreas: Diffuse inflammation surrounds the pancreatic head and neck region consistent with pancreatitis.  Taking into account limitation by non contrast imaging, no underlying pancreatic mass identified. No well-defined drainable abscess. Spleen: Within normal limits in size. Adrenals/Urinary Tract: Left lower pole 2 mm and  5 mm nonobstructing stone. No evidence of left-sided hydronephrosis. Taking into account limitation by non contrast imaging, no left renal mass noted. Diminutive size right kidney once again noted with small cyst. No adrenal lesion. Stomach/Bowel: Fluid surrounds the posterior border stomach and duodenum secondary to pancreatitis however, no primary bowel inflammatory process noted. Fluid extends along the anterior margin of the left para renal space. Vascular/Lymphatic: No abdominal aortic aneurysm. Small number of upper abdominal lymph nodes appear reactive in origin. Reproductive: Negative. Other: Negative. Musculoskeletal: No suspicious bone lesions identified. Degenerative changes L5-S1. IMPRESSION: Findings consistent with pancreatitis. Fluid surrounds the posterior border of the stomach, duodenum and courses along the anterior margin of the left para renal space. Left lower pole 2 mm and 5 mm nonobstructing stone. No evidence of left-sided hydronephrosis. Diminutive size right kidney once again noted with small cyst. Electronically Signed   By: Genia Del M.D.   On: 11/23/2015 12:44    Results/Tests Pending at Time of Discharge: none  Discharge Medications:    Medication List    STOP taking these medications   azithromycin 250 MG tablet Commonly known as:  ZITHROMAX     TAKE these medications   acetaminophen 500 MG tablet Commonly known as:  TYLENOL Take 1,000 mg by mouth every 6 (six) hours as needed for headache.   amLODipine 5 MG tablet Commonly known as:  NORVASC Take 1 tablet (5 mg total) by mouth daily.   omeprazole 20 MG capsule Commonly known as:  PRILOSEC Take 20 mg by mouth daily as needed (heart burn).   oxyCODONE 5 MG immediate release tablet Commonly known as:  Oxy IR/ROXICODONE Take 1 tablet (5 mg total) by mouth every 4 (four) hours as needed for severe pain.       Discharge Instructions: Please refer to Patient Instructions section of EMR for full details.   Patient was counseled important signs and symptoms that should prompt return to medical care, changes in medications, dietary instructions, activity restrictions, and follow up appointments.   Follow-Up Appointments: Follow-up Information    Andrena Mews, MD Follow up on 12/02/2015.   Specialty:  Family Medicine Why:  Hospital follow-up appointment at 10:15am Contact information: [place] [address] [phone]           Bufford Lope, DO 11/28/2015, 8:08 PM PGY-1, [place]

## 2015-11-26 DIAGNOSIS — F1010 Alcohol abuse, uncomplicated: Secondary | ICD-10-CM

## 2015-11-26 LAB — BASIC METABOLIC PANEL
Anion gap: 13 (ref 5–15)
BUN: 6 mg/dL (ref 6–20)
CALCIUM: 8.7 mg/dL — AB (ref 8.9–10.3)
CHLORIDE: 101 mmol/L (ref 101–111)
CO2: 21 mmol/L — ABNORMAL LOW (ref 22–32)
CREATININE: 1.19 mg/dL (ref 0.61–1.24)
GFR calc Af Amer: >60 mL/min (ref >60)
GFR calc non Af Amer: >60 mL/min (ref >60)
Glucose, Bld: 83 mg/dL (ref 65–99)
Potassium: 3.4 mmol/L — ABNORMAL LOW (ref 3.5–5.1)
SODIUM: 135 mmol/L (ref 135–145)

## 2015-11-26 MED ORDER — OXYCODONE HCL 5 MG PO TABS: 5.0000 mg | ORAL_TABLET | ORAL | 0 refills | Status: DC | PRN

## 2015-11-26 MED ORDER — OXYCODONE HCL 5 MG PO TABS
5.0000 mg | ORAL_TABLET | ORAL | Status: DC | PRN
  Administered 2015-11-26: 5 mg via ORAL

## 2015-11-26 MED ORDER — FAMOTIDINE 20 MG PO TABS
20.0000 mg | ORAL_TABLET | Freq: Two times a day (BID) | ORAL | Status: DC
  Administered 2015-11-26: 20 mg via ORAL
  Filled 2015-11-26: qty 1

## 2015-11-26 MED ORDER — POTASSIUM CHLORIDE CRYS ER 20 MEQ PO TBCR
40.0000 meq | EXTENDED_RELEASE_TABLET | Freq: Once | ORAL | Status: AC
  Administered 2015-11-26: 40 meq via ORAL
  Filled 2015-11-26: qty 2

## 2015-11-26 NOTE — Progress Notes (Signed)
Nsg Discharge Note  Admit Date:  11/23/2015 Discharge date: 11/26/2015   Kyle Berry to be D/C'd Home per MD order.  AVS completed.  Copy for chart, and copy for patient signed, and dated. Patient/caregiver able to verbalize understanding.  Discharge Medication:   Medication List    STOP taking these medications   azithromycin 250 MG tablet Commonly known as:  ZITHROMAX     TAKE these medications   acetaminophen 500 MG tablet Commonly known as:  TYLENOL Take 1,000 mg by mouth every 6 (six) hours as needed for headache.   amLODipine 5 MG tablet Commonly known as:  NORVASC Take 1 tablet (5 mg total) by mouth daily.   omeprazole 20 MG capsule Commonly known as:  PRILOSEC Take 20 mg by mouth daily as needed (heart burn).   oxyCODONE 5 MG immediate release tablet Commonly known as:  Oxy IR/ROXICODONE Take 1 tablet (5 mg total) by mouth every 4 (four) hours as needed for severe pain.       Discharge Assessment: Vitals:   11/26/15 0428 11/26/15 1406  BP: 139/89 127/81  Pulse: 88 99  Resp: 20 20  Temp: 98.4 F (36.9 C) 99.9 F (37.7 C)   Skin clean, dry and intact without evidence of skin break down, no evidence of skin tears noted. IV catheter discontinued intact. Site without signs and symptoms of complications - no redness or edema noted at insertion site, patient denies c/o pain - only slight tenderness at site.  Dressing with slight pressure applied.  D/c Instructions-Education: Discharge instructions given to patient/family with verbalized understanding. D/c education completed with patient/family including follow up instructions, medication list, d/c activities limitations if indicated, with other d/c instructions as indicated by MD - patient able to verbalize understanding, all questions fully answered. Patient instructed to return to ED, call 911, or call MD for any changes in condition.  Patient escorted via [place], and D/C home via private auto.  Salley Slaughter, RN 11/26/2015 2:45 PM

## 2015-11-26 NOTE — Progress Notes (Signed)
Family Medicine Teaching Service Daily Progress Note Intern Pager: [phone]  Patient name: Kyle Berry Medical record number: [id number] Date of birth: 05-16-1970 Age: 45 y.o. Gender: male  Primary Care Provider: [facility] Consultants: None Code Status: FULL  Pt Overview and Major Events to Date:  8/9 Admitted for pancreatitis  Assessment and Plan: Kyle Berry is a 45 y.o. male presenting with Left flank and epigastric pain radiating to the back, found to have pancreatitis. PMH is significant for HTN, nephrolithiasis, and congenital single kidney.  Acute Pancreatitis: likely 2/2 EtOH abuse. CT renal study positive for diffuse inflammation surrounding the pancreatic head and neck region. Pain is improved. - Tolerating clear liquid diet, advance to GI soft at lunch - Switch pain med from dilaudid to oxy IR. - Zofran PRN for nausea - Change from IV Pepcid to PO Pepcid  Alcohol use - patient endorses ~40oz beers per day, has never gone through withdrawal. Last drink on 11/22/15. Scoring 0 on CIWA - monitor on CIWA   Mild Hypokalemia. K 3.4 this morning. - Replace with K-dur - monitor BMP  HTN - stable, BP 139/89 this morning. - Holding home Norvasc 5mg , can likely restart on discharge  History of GERD - patient with epigastric pain, though this is most likely due to pancreatitis.   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 Patient denies HA or diplopia recently. Reports he was told to follow up with neurology but has not been able to yet.  - Monitor  FEN/GI: ADAT, pepcid Prophylaxis: lovenox  Disposition: Plan for discharge home today.  Subjective:  Pt states his pain is improved. He states he was able to tolerate  grits and clear liquids this morning without any pain, but then he developed mildly worsening pain after drinking milk. He would like to try a soft diet for lunch today.  Objective: Temp:  [98.4 F (36.9 C)-100 F (37.8 C)] 98.4 F (36.9 C) (08/12 0428) Pulse Rate:  [86-102] 88 (08/12 0428) Resp:  [18-20] 20 (08/12 0428) BP: (139-152)/(82-98) 139/89 (08/12 0428) SpO2:  [94 %-98 %] 95 % (08/12 0428) Physical Exam: General: sitting up in bed, in NAD, pleasant and talkative Cardiovascular: RRR, no murmurs noted Respiratory: CTAB, normal effort Abdomen: +mild epigastric tenderness, nondistended, normoactive BS, no rebound or guarding Extremities: no lower extremity edema  Laboratory:  Recent Labs Lab 11/23/15 1118 11/24/15 0536 11/25/15 0431  WBC 9.8 10.0 10.4  HGB 14.7 12.9* 11.9*  HCT 43.9 39.6 37.1*  PLT 233 210 209    Recent Labs Lab 11/23/15 1118 11/24/15 0536 11/25/15 0431 11/26/15 0802  NA 139 139 136 135  K 3.4* 3.6 3.4* 3.4*  CL 104 105 102 101  CO2 23 24 24  21*  BUN 6 5* <5* 6  CREATININE 1.26* 1.02 1.06 1.19  CALCIUM 9.4 8.7* 8.5* 8.7*  PROT 7.6 6.4* 6.0*  --   BILITOT 0.7 0.9 1.2  --   ALKPHOS 80 59 59  --   ALT 40 30 22  --   AST 44* 26 22  --   GLUCOSE 166* 123* 92 83   Triglycerides 494  Imaging/Diagnostic Tests: Ct Renal Stone Study  Result Date: 11/23/2015 CLINICAL DATA:  45 year old male with left flank pain nausea and vomiting. History  of kidney stones. Initial encounter. EXAM: CT ABDOMEN AND PELVIS WITHOUT CONTRAST TECHNIQUE: Multidetector CT imaging of the abdomen and pelvis was performed following the standard protocol without IV contrast. COMPARISON:  08/19/2014 CT.  01/20/2015 ultrasound. FINDINGS: Lower chest: Minimal atelectasis/scarring right middle lobe and right lung base. Hepatobiliary: Taking into account limitation by non contrast imaging, no focal hepatic lesion. No calcified gallstones. No calcified common bile duct stone. Pancreas:  Diffuse inflammation surrounds the pancreatic head and neck region consistent with pancreatitis. Taking into account limitation by non contrast imaging, no underlying pancreatic mass identified. No well-defined drainable abscess. Spleen: Within normal limits in size. Adrenals/Urinary Tract: Left lower pole 2 mm and 5 mm nonobstructing stone. No evidence of left-sided hydronephrosis. Taking into account limitation by non contrast imaging, no left renal mass noted. Diminutive size right kidney once again noted with small cyst. No adrenal lesion. Stomach/Bowel: Fluid surrounds the posterior border stomach and duodenum secondary to pancreatitis however, no primary bowel inflammatory process noted. Fluid extends along the anterior margin of the left para renal space. Vascular/Lymphatic: No abdominal aortic aneurysm. Small number of upper abdominal lymph nodes appear reactive in origin. Reproductive: Negative. Other: Negative. Musculoskeletal: No suspicious bone lesions identified. Degenerative changes L5-S1. IMPRESSION: Findings consistent with pancreatitis. Fluid surrounds the posterior border of the stomach, duodenum and courses along the anterior margin of the left para renal space. Left lower pole 2 mm and 5 mm nonobstructing stone. No evidence of left-sided hydronephrosis. Diminutive size right kidney once again noted with small cyst. Electronically Signed   By: Genia Del M.D.   On: 11/23/2015 12:44    Sela Hua, MD 11/26/2015, 9:33 AM PGY-2, [place] Intern pager: [phone], text pages welcome

## 2015-11-26 NOTE — Discharge Instructions (Signed)
It was so nice to meet you! You were hospitalized because you were having pancreatitis. We treated your pain, hydrated you, and advanced your diet slowly. We have discharged you home with pain medication that you can take every 4 hours as needed. We expect that you may continue to have mild abdominal pain over the next week or so, but it should continue to get better.  If you notice fevers, worsening abdominal pain, vomiting to the point that you can't keep down liquids, please come back to the emergency department.

## 2015-12-02 ENCOUNTER — Encounter (INDEPENDENT_AMBULATORY_CARE_PROVIDER_SITE_OTHER): Payer: Self-pay | Admitting: Family Medicine

## 2015-12-02 NOTE — Progress Notes (Deleted)
Subjective:     Patient ID: Kyle Berry, male   DOB: 10-19-1970, 45 y.o.   MRN: [id number]  HPI .Marland Kitchen  Review of Systems  Respiratory: Negative.   Cardiovascular: Negative.   Gastrointestinal: Negative.   Genitourinary: Negative.   All other systems reviewed and are negative.      Objective:   Physical Exam  Constitutional: He is oriented to person, place, and time. He appears well-developed. No distress.  Cardiovascular: Normal rate, regular rhythm, normal heart sounds and intact distal pulses.   No murmur heard. Pulmonary/Chest: Effort normal and breath sounds normal. No respiratory distress. He has no wheezes. He has no rales.  Abdominal: Soft. Bowel sounds are normal. He exhibits no distension and no mass. There is no tenderness. There is no rebound and no guarding.  Musculoskeletal: Normal range of motion. He exhibits no deformity.  Neurological: He is alert and oriented to person, place, and time. No cranial nerve deficit.  Nursing note and vitals reviewed.      Assessment:   Plan:

## 2015-12-03 NOTE — Progress Notes (Signed)
error 

## 2015-12-06 ENCOUNTER — Ambulatory Visit (INDEPENDENT_AMBULATORY_CARE_PROVIDER_SITE_OTHER): Payer: Self-pay | Admitting: Family Medicine

## 2015-12-06 ENCOUNTER — Encounter: Payer: Self-pay | Admitting: Family Medicine

## 2015-12-06 VITALS — BP 137/84 | HR 96 | Temp 98.5°F | Wt 194.0 lb

## 2015-12-06 DIAGNOSIS — D1801 Hemangioma of skin and subcutaneous tissue: Secondary | ICD-10-CM

## 2015-12-06 DIAGNOSIS — Z23 Encounter for immunization: Secondary | ICD-10-CM

## 2015-12-06 DIAGNOSIS — Z114 Encounter for screening for human immunodeficiency virus [HIV]: Secondary | ICD-10-CM

## 2015-12-06 DIAGNOSIS — K858 Other acute pancreatitis without necrosis or infection: Secondary | ICD-10-CM

## 2015-12-06 DIAGNOSIS — Z09 Encounter for follow-up examination after completed treatment for conditions other than malignant neoplasm: Secondary | ICD-10-CM

## 2015-12-06 DIAGNOSIS — D1800 Hemangioma unspecified site: Secondary | ICD-10-CM

## 2015-12-06 DIAGNOSIS — Z0000 Encounter for general adult medical examination without abnormal findings: Secondary | ICD-10-CM

## 2015-12-06 NOTE — Progress Notes (Signed)
Subjective:     Patient ID: Kyle Berry, male   DOB: 05-09-1970, 45 y.o.   MRN: [id number]  HPI Pancreatitis: here for hospital follow up. He denies any pain, no N/V, no diarrhea or constipation. He stated he eats 3 times daily and at times more with no pain. He stopped drinking alcohol since discharged from the hospital and also avoids greasy meal. He has appointment with his PCP upcoming next month. Cavernoma: He denies any headache, no dizziness, no vision change, no other neurologic symptoms. Here for follow up. He stated he was seen by a neurologist in 2015 for this but was advised to see a neurovascular surg then. Since then he never followed up. HM: here for routine follow up.  Current Outpatient Prescriptions on File Prior to Visit  Medication Sig Dispense Refill  . omeprazole (PRILOSEC) 20 MG capsule Take 20 mg by mouth daily as needed (heart burn).    Marland Kitchen acetaminophen (TYLENOL) 500 MG tablet Take 1,000 mg by mouth every 6 (six) hours as needed for headache.    Marland Kitchen amLODipine (NORVASC) 5 MG tablet Take 1 tablet (5 mg total) by mouth daily. 30 tablet 0  . oxyCODONE (OXY IR/ROXICODONE) 5 MG immediate release tablet Take 1 tablet (5 mg total) by mouth every 4 (four) hours as needed for severe pain. (Patient not taking: Reported on 12/06/2015) 20 tablet 0  . [DISCONTINUED] Ibuprofen-Diphenhydramine HCl (ADVIL PM) 200-25 MG CAPS Take 1 tablet by mouth at bedtime as needed (sleep).     No current facility-administered medications on file prior to visit.    Past Medical History:  Diagnosis Date  . Acute pancreatitis 11/23/2015  . Chronic bronchitis ([place])   . Chronic lower back pain   . Congenital single kidney   . GERD (gastroesophageal reflux disease)   . High cholesterol   . Hypertension   . Kidney calculi   . Migraines    "once/month" (11/23/2015)  . Nephrolithiasis   . Pneumonia ~ 09/2015  . Renal disorder       Review of Systems  Respiratory: Negative.   Cardiovascular:  Negative.   Gastrointestinal: Negative.   Genitourinary: Negative.   Neurological: Negative.   All other systems reviewed and are negative.  Vitals:   12/06/15 0913  BP: 137/84  Pulse: 96  Temp: 98.5 F (36.9 C)  TempSrc: Oral  SpO2: 96%  Weight: 194 lb (88 kg)       Objective:   Physical Exam  Constitutional: He is oriented to person, place, and time. He appears well-developed. No distress.  Cardiovascular: Normal rate, regular rhythm, normal heart sounds and intact distal pulses.   No murmur heard. Pulmonary/Chest: Effort normal and breath sounds normal. No respiratory distress. He has no wheezes.  Abdominal: Soft. Bowel sounds are normal. He exhibits no distension and no mass. There is no tenderness. There is no rebound and no guarding.  Musculoskeletal: Normal range of motion. He exhibits no edema.  Neurological: He is alert and oriented to person, place, and time. He has normal strength. No cranial nerve deficit. Gait normal.  Nursing note and vitals reviewed.      Assessment:     1. Pancreatitis ( Acute but resolved). 2. Left Frontal Cavernoma 3. Health maintenance     Plan:     1. Patient doing well after hospital discharge. I again counseled him on diet trigger avoidance and commended him on quitting alcohol. Continue other age appropriate diet as tolerated. F/U with PCP as indicated.  2. I reviewed and discussed his MRI from 2015 with him. Does not seem to have any acute neurologic symptoms.     I recommended that he discuss referral with his PCP. He verbalized understanding and agree with plan. Return precaution discussed.  3. Flu shot and and HIV recommended today. He agreed with plan. Shot was given and HIV test done. I will contact him with result.

## 2015-12-06 NOTE — Patient Instructions (Signed)
Acute Pancreatitis Acute pancreatitis is a disease in which the pancreas becomes suddenly irritated (inflamed). The pancreas is a large gland behind your stomach. The pancreas makes enzymes that help digest food. The pancreas also makes 2 hormones that help control your blood sugar. Acute pancreatitis happens when the enzymes attack and damage the pancreas. Most attacks last a couple of days and can cause serious problems. HOME CARE  Follow your doctor's diet instructions. You may need to avoid alcohol and limit fat in your diet.  Eat small meals often.  Drink enough fluids to keep your pee (urine) clear or pale yellow.  Only take medicines as told by your doctor.  Avoid drinking alcohol if it caused your disease.  Do not smoke.  Get plenty of rest.  Check your blood sugar at home as told by your doctor.  Keep all doctor visits as told. GET HELP IF:  You do not get better as quickly as expected.  You have new or worsening symptoms.  You have lasting pain, weakness, or feel sick to your stomach (nauseous).  You get better and then have another pain attack. GET HELP RIGHT AWAY IF:   You are unable to eat or keep fluids down.  Your pain becomes severe.  You have a fever or lasting symptoms for more than 2 to 3 days.  You have a fever and your symptoms suddenly get worse.  Your skin or the white part of your eyes turn yellow (jaundice).  You throw up (vomit).  You feel dizzy, or you pass out (faint).  Your blood sugar is high (over 300 mg/dL). MAKE SURE YOU:   Understand these instructions.  Will watch your condition.  Will get help right away if you are not doing well or get worse.   This information is not intended to replace advice given to you by your health care provider. Make sure you discuss any questions you have with your health care provider.   Document Released: 09/19/2007 Document Revised: 04/23/2014 Document Reviewed: 07/12/2011 Elsevier Interactive  Patient Education [employer].

## 2015-12-07 ENCOUNTER — Telehealth: Payer: Self-pay | Admitting: Family Medicine

## 2015-12-07 LAB — HIV ANTIBODY (ROUTINE TESTING W REFLEX): HIV 1&2 Ab, 4th Generation: NONREACTIVE

## 2015-12-07 NOTE — Telephone Encounter (Signed)
Left HIPPA compliant message with his brother for him to call back.

## 2015-12-13 ENCOUNTER — Encounter (HOSPITAL_COMMUNITY): Payer: Self-pay

## 2015-12-13 ENCOUNTER — Emergency Department (HOSPITAL_COMMUNITY)
Admission: EM | Admit: 2015-12-13 | Discharge: 2015-12-13 | Disposition: A | Discharge status: Home / Self Care | Payer: Self-pay | Attending: Emergency Medicine | Admitting: Emergency Medicine

## 2015-12-13 DIAGNOSIS — I10 Essential (primary) hypertension: Secondary | ICD-10-CM | POA: Insufficient documentation

## 2015-12-13 DIAGNOSIS — R350 Frequency of micturition: Secondary | ICD-10-CM | POA: Insufficient documentation

## 2015-12-13 LAB — URINALYSIS, ROUTINE W REFLEX MICROSCOPIC
Bilirubin Urine: NEGATIVE
GLUCOSE, UA: NEGATIVE mg/dL
Hgb urine dipstick: NEGATIVE
KETONES UR: NEGATIVE mg/dL
LEUKOCYTES UA: NEGATIVE
NITRITE: NEGATIVE
PROTEIN: NEGATIVE mg/dL
Specific Gravity, Urine: 1.015 (ref 1.005–1.030)
pH: 6 (ref 5.0–8.0)

## 2015-12-13 MED ORDER — ONDANSETRON 4 MG PO TBDP: 4.0000 mg | ORAL_TABLET | Freq: Three times a day (TID) | ORAL | 0 refills | Status: DC | PRN

## 2015-12-13 MED ORDER — ONDANSETRON 4 MG PO TBDP
4.0000 mg | ORAL_TABLET | Freq: Once | ORAL | Status: AC
  Administered 2015-12-13: 4 mg via ORAL
  Filled 2015-12-13: qty 1

## 2015-12-13 NOTE — ED Provider Notes (Signed)
[facility] DEPT Provider Note   CSN: [id number] Arrival date & time: 12/13/15  [id number]     History   Chief Complaint Chief Complaint  Patient presents with  . Urinary Frequency  . Nausea    HPI Kyle Berry is a 45 y.o. male.  HPI   Kyle Berry is a 45 y.o. male, with a history of pancreatitis, HTN, and kidney stones, presenting to the ED with nausea and urinary frequency beginning yesterday. Pt also complains of some minor, intermittent lower-mid back pain, dull/throbbing, nonradiating. Pt has two known kidney stones in his only functioning kidney on the left.  Pt was admitted on August 9 for acute pancreatitis. CT notes two kidney stones in the left lower pole, 2 mm and 5 mm, nonobstructing.  Denies fever/chills, abdominal pain, penile discharge, testicular pain/swelling, pain with BMs, or any other complaints.      Past Medical History:  Diagnosis Date  . Acute pancreatitis 11/23/2015  . Chronic bronchitis ([place])   . Chronic lower back pain   . Congenital single kidney   . GERD (gastroesophageal reflux disease)   . High cholesterol   . Hypertension   . Kidney calculi   . Migraines    "once/month" (11/23/2015)  . Nephrolithiasis   . Pneumonia ~ 09/2015  . Renal disorder     Patient Active Problem List   Diagnosis Date Noted  . Alcohol abuse   . Essential hypertension   . Esophageal reflux   . Acute pancreatitis 11/23/2015    Past Surgical History:  Procedure Laterality Date  . NO PAST SURGERIES         Home Medications    Prior to Admission medications   Medication Sig Start Date End Date Taking? Authorizing Provider  acetaminophen (TYLENOL) 500 MG tablet Take 1,000 mg by mouth every 6 (six) hours as needed for headache.   Yes Historical Provider, MD  amLODipine (NORVASC) 5 MG tablet Take 1 tablet (5 mg total) by mouth daily. 10/01/15 12/13/15 Yes Junius Creamer, NP  omeprazole (PRILOSEC) 20 MG capsule Take 20 mg by mouth daily as needed  (heart burn).   Yes Historical Provider, MD  ondansetron (ZOFRAN ODT) 4 MG disintegrating tablet Take 1 tablet (4 mg total) by mouth every 8 (eight) hours as needed for nausea or vomiting. 12/13/15   Nur Krasinski C Caileb Rhue, PA-C  oxyCODONE (OXY IR/ROXICODONE) 5 MG immediate release tablet Take 1 tablet (5 mg total) by mouth every 4 (four) hours as needed for severe pain. Patient not taking: Reported on 12/06/2015 11/26/15   Sela Hua, MD    Family History No family history on file.  Social History Social History  Substance Use Topics  . Smoking status: Never Smoker  . Smokeless tobacco: Never Used  . Alcohol use 3.6 oz/week    6 Cans of beer per week     Allergies   Diclofenac and Prednisone   Review of Systems Review of Systems  Constitutional: Negative for chills and fever.  Gastrointestinal: Positive for nausea. Negative for abdominal pain, blood in stool, constipation, diarrhea, rectal pain and vomiting.  Genitourinary: Positive for frequency. Negative for dysuria, flank pain and hematuria.  Musculoskeletal: Positive for back pain.  All other systems reviewed and are negative.    Physical Exam Updated Vital Signs BP 140/94   Pulse 88   Temp 98.3 F (36.8 C) (Oral)   Resp 16   Ht 6\' 1"  (1.854 m)   Wt 88 kg   SpO2 98%  BMI 25.60 kg/m   Physical Exam  Constitutional: He appears well-developed and well-nourished. No distress.  HENT:  Head: Normocephalic and atraumatic.  Eyes: Conjunctivae are normal.  Neck: Neck supple.  Cardiovascular: Normal rate, regular rhythm, normal heart sounds and intact distal pulses.   Pulmonary/Chest: Effort normal and breath sounds normal. No respiratory distress.  Abdominal: Soft. There is no tenderness. There is no guarding and no CVA tenderness.  Musculoskeletal: He exhibits no edema or tenderness.  Lymphadenopathy:    He has no cervical adenopathy.  Neurological: He is alert.  Skin: Skin is warm and dry. He is not diaphoretic.    Psychiatric: He has a normal mood and affect. His behavior is normal.  Nursing note and vitals reviewed.    ED Treatments / Results  Labs (all labs ordered are listed, but only abnormal results are displayed) Labs Reviewed  URINE CULTURE  URINALYSIS, ROUTINE W REFLEX MICROSCOPIC (NOT AT [facility])    EKG  EKG Interpretation None       Radiology No results found.  Procedures Procedures (including critical care time)  Medications Ordered in ED Medications  ondansetron (ZOFRAN-ODT) disintegrating tablet 4 mg (4 mg Oral Given 12/13/15 0944)     Initial Impression / Assessment and Plan / ED Course  I have reviewed the triage vital signs and the nursing notes.  Pertinent labs & imaging results that were available during my care of the patient were reviewed by me and considered in my medical decision making (see chart for details).  Clinical Course    Kyle Berry presents with urinary frequency for the last 24 hours.  No abnormalities on exam or UA. The discomfort may be from patient's existing kidney stones. Patient is nontoxic appearing, afebrile, not tachycardic, not tachypneic, not hypotensive, and is in no apparent distress. Patient has no signs of sepsis or other serious or life-threatening condition. Pt will follow up with urology. The patient was given instructions for home care as well as return precautions. Patient voices understanding of these instructions, accepts the plan, and is comfortable with discharge.  Vitals:   12/13/15 0919 12/13/15 0920 12/13/15 0929 12/13/15 1051  BP: (!) 145/105  140/94 137/83  Pulse: 91  88 86  Resp: 18  16 16   Temp: 98.3 F (36.8 C)   98.1 F (36.7 C)  TempSrc: Oral   Oral  SpO2: 99%  98% 99%  Weight:  88 kg    Height:  6\' 1"  (1.854 m)       Final Clinical Impressions(s) / ED Diagnoses   Final diagnoses:  Urinary frequency    New Prescriptions New Prescriptions   ONDANSETRON (ZOFRAN ODT) 4 MG DISINTEGRATING  TABLET    Take 1 tablet (4 mg total) by mouth every 8 (eight) hours as needed for nausea or vomiting.     Lorayne Bender, PA-C 12/13/15 1055    Isla Pence, MD 12/14/15 [phone]

## 2015-12-13 NOTE — Discharge Instructions (Signed)
You have been seen today for urinary frequency. Your lab tests showed no abnormalities. Follow up with urology should symptoms continue. Be sure to stay well-hydrated. Zofran for nausea. Return to ED should symptoms worsen.

## 2015-12-13 NOTE — ED Triage Notes (Signed)
Patient complains of urinary frequency and left flank pain since Sunday. complains of intermittent nausea and dizziness with same. States has only 1 functioning kidney, left kidney

## 2015-12-14 LAB — URINE CULTURE: Culture: NO GROWTH

## 2016-06-24 ENCOUNTER — Encounter (HOSPITAL_COMMUNITY): Payer: Self-pay

## 2016-06-24 ENCOUNTER — Emergency Department (HOSPITAL_COMMUNITY): Payer: Self-pay

## 2016-06-24 ENCOUNTER — Inpatient Hospital Stay (HOSPITAL_COMMUNITY)
Admission: EM | Admit: 2016-06-24 | Discharge: 2016-06-27 | DRG: 440 | Disposition: A | Discharge status: Home / Self Care | Payer: Self-pay | Attending: Internal Medicine | Admitting: Internal Medicine

## 2016-06-24 DIAGNOSIS — F1010 Alcohol abuse, uncomplicated: Secondary | ICD-10-CM | POA: Diagnosis present

## 2016-06-24 DIAGNOSIS — Z79899 Other long term (current) drug therapy: Secondary | ICD-10-CM

## 2016-06-24 DIAGNOSIS — F1020 Alcohol dependence, uncomplicated: Secondary | ICD-10-CM | POA: Diagnosis present

## 2016-06-24 DIAGNOSIS — I10 Essential (primary) hypertension: Secondary | ICD-10-CM | POA: Diagnosis present

## 2016-06-24 DIAGNOSIS — R1013 Epigastric pain: Secondary | ICD-10-CM

## 2016-06-24 DIAGNOSIS — K861 Other chronic pancreatitis: Secondary | ICD-10-CM | POA: Diagnosis present

## 2016-06-24 DIAGNOSIS — K852 Alcohol induced acute pancreatitis without necrosis or infection: Principal | ICD-10-CM | POA: Diagnosis present

## 2016-06-24 DIAGNOSIS — K219 Gastro-esophageal reflux disease without esophagitis: Secondary | ICD-10-CM | POA: Diagnosis present

## 2016-06-24 LAB — COMPREHENSIVE METABOLIC PANEL
ALT: 26 U/L (ref 17–63)
AST: 31 U/L (ref 15–41)
Albumin: 3.9 g/dL (ref 3.5–5.0)
Alkaline Phosphatase: 59 U/L (ref 38–126)
Anion gap: 9 (ref 5–15)
BUN: 5 mg/dL — ABNORMAL LOW (ref 6–20)
CO2: 24 mmol/L (ref 22–32)
Calcium: 9.4 mg/dL (ref 8.9–10.3)
Chloride: 102 mmol/L (ref 101–111)
Creatinine, Ser: 1.05 mg/dL (ref 0.61–1.24)
GFR calc Af Amer: >60 mL/min (ref >60)
GFR calc non Af Amer: >60 mL/min (ref >60)
Glucose, Bld: 126 mg/dL — ABNORMAL HIGH (ref 65–99)
Potassium: 3.7 mmol/L (ref 3.5–5.1)
Sodium: 135 mmol/L (ref 135–145)
Total Bilirubin: 1.1 mg/dL (ref 0.3–1.2)
Total Protein: 7.8 g/dL (ref 6.5–8.1)

## 2016-06-24 LAB — URINALYSIS, ROUTINE W REFLEX MICROSCOPIC
Bilirubin Urine: NEGATIVE
Glucose, UA: NEGATIVE mg/dL
Ketones, ur: NEGATIVE mg/dL
Leukocytes, UA: NEGATIVE
Nitrite: NEGATIVE
Protein, ur: 30 mg/dL — AB
Specific Gravity, Urine: 1.021 (ref 1.005–1.030)
pH: 5 (ref 5.0–8.0)

## 2016-06-24 LAB — CBC
HCT: 45 % (ref 39.0–52.0)
Hemoglobin: 15.3 g/dL (ref 13.0–17.0)
MCH: 31.9 pg (ref 26.0–34.0)
MCHC: 34 g/dL (ref 30.0–36.0)
MCV: 93.8 fL (ref 78.0–100.0)
Platelets: 207 10*3/uL (ref 150–400)
RBC: 4.8 MIL/uL (ref 4.22–5.81)
RDW: 13.7 % (ref 11.5–15.5)
WBC: 10.5 10*3/uL (ref 4.0–10.5)

## 2016-06-24 LAB — LIPASE, BLOOD: Lipase: 176 U/L — ABNORMAL HIGH (ref 11–51)

## 2016-06-24 MED ORDER — MORPHINE SULFATE (PF) 4 MG/ML IV SOLN
8.0000 mg | Freq: Once | INTRAVENOUS | Status: AC
  Administered 2016-06-24: 8 mg via INTRAVENOUS
  Filled 2016-06-24: qty 2

## 2016-06-24 MED ORDER — IOPAMIDOL (ISOVUE-300) INJECTION 61%
INTRAVENOUS | Status: AC
  Administered 2016-06-24: 100 mL
  Filled 2016-06-24: qty 100

## 2016-06-24 MED ORDER — HYDROMORPHONE HCL 2 MG/ML IJ SOLN: 1.0000 mg | Freq: Once | INTRAMUSCULAR | Status: DC

## 2016-06-24 MED ORDER — HYDROMORPHONE HCL 2 MG/ML IJ SOLN
1.0000 mg | Freq: Once | INTRAMUSCULAR | Status: AC
  Administered 2016-06-24: 1 mg via INTRAVENOUS
  Filled 2016-06-24: qty 1

## 2016-06-24 MED ORDER — ONDANSETRON HCL 4 MG/2ML IJ SOLN
4.0000 mg | Freq: Once | INTRAMUSCULAR | Status: AC
Start: 2016-06-24 — End: 2016-06-24
  Administered 2016-06-24: 4 mg via INTRAVENOUS
  Filled 2016-06-24: qty 2

## 2016-06-24 MED ORDER — ONDANSETRON HCL 4 MG/2ML IJ SOLN
4.0000 mg | Freq: Once | INTRAMUSCULAR | Status: AC
  Administered 2016-06-24: 4 mg via INTRAVENOUS
  Filled 2016-06-24: qty 2

## 2016-06-24 MED ORDER — FAMOTIDINE IN NACL 20-0.9 MG/50ML-% IV SOLN
20.0000 mg | Freq: Once | INTRAVENOUS | Status: AC
  Administered 2016-06-24: 20 mg via INTRAVENOUS
  Filled 2016-06-24: qty 50

## 2016-06-24 NOTE — ED Triage Notes (Signed)
Patient complains of epigastric pain with nausea and vomiting that started today, states that he thinks its his pancreatitis again today. No vomiting on arrival

## 2016-06-24 NOTE — ED Provider Notes (Signed)
[facility] DEPT Provider Note   CSN: [id number] Arrival date & time: 06/24/16  1544  By signing my name below, I, Kyle Berry, attest that this documentation has been prepared under the direction and in the presence of Kyle Manifold, MD. Electronically Signed: Reola Berry, ED Scribe. 06/24/16. 5:26 PM.  History   Chief Complaint Chief Complaint  Patient presents with  . Abdominal Pain  . Nausea   The history is provided by the patient and medical records. No language interpreter was used.   HPI Comments: Kyle Berry is a 46 y.o. male with a PMHx of acute pancreatitis secondary to alcohol abuse, GERD, nephrolithiasis, who presents to the Emergency Department complaining of intermittent epigastric abdominal pain beginning yesterday, worsening and constant since earlier today. Pt notes that his pain initially began yesterday evening; however, his pain significantly worsened and became constant following PO intake this afternoon. Pt has a prior h/o pancreatitis in the setting of alcohol abuse and states that his pain today feels similar and consistent with a flare-up of this. Per prior chart review, pt has been seen for same in the ED, most recently on 11/23/15 (~7 months ago). At that time he had a CT renal study performed which overall showed findings consistent with pancreatitis. Pt has scaled back his alcohol consumption to only beer, and notes that his last drink of this was two days ago. He has been taking OTC pain medications at home without relief of his pain. His pain is worse with generalized movements. No PSHx to the abdomen. He denies fever, chills, vomiting, urgency, frequency, hematuria, dysuria, difficulty urinating, diarrhea, or any other associated symptoms.   Past Medical History:  Diagnosis Date  . Acute pancreatitis 11/23/2015  . Chronic bronchitis ([place])   . Chronic lower back pain   . Congenital single kidney   . GERD (gastroesophageal reflux  disease)   . High cholesterol   . Hypertension   . Kidney calculi   . Migraines    "once/month" (11/23/2015)  . Nephrolithiasis   . Pneumonia ~ 09/2015  . Renal disorder    Patient Active Problem List   Diagnosis Date Noted  . Alcohol abuse   . Essential hypertension   . Esophageal reflux   . Acute pancreatitis 11/23/2015   Past Surgical History:  Procedure Laterality Date  . NO PAST SURGERIES      Home Medications    Prior to Admission medications   Medication Sig Start Date End Date Taking? Authorizing Provider  acetaminophen (TYLENOL) 500 MG tablet Take 1,000 mg by mouth every 6 (six) hours as needed for headache.   Yes Historical Provider, MD  amLODipine (NORVASC) 5 MG tablet Take 1 tablet (5 mg total) by mouth daily. 10/01/15 06/24/16 Yes Junius Creamer, NP  omeprazole (PRILOSEC) 20 MG capsule Take 20 mg by mouth daily as needed (heart burn).   Yes Historical Provider, MD  ondansetron (ZOFRAN ODT) 4 MG disintegrating tablet Take 1 tablet (4 mg total) by mouth every 8 (eight) hours as needed for nausea or vomiting. Patient not taking: Reported on 06/24/2016 12/13/15   Shawn C Joy, PA-C  oxyCODONE (OXY IR/ROXICODONE) 5 MG immediate release tablet Take 1 tablet (5 mg total) by mouth every 4 (four) hours as needed for severe pain. Patient not taking: Reported on 12/06/2015 11/26/15   Sela Hua, MD   Family History No family history on file.  Social History Social History  Substance Use Topics  . Smoking status: Never Smoker  .  Smokeless tobacco: Never Used  . Alcohol use 3.6 oz/week    6 Cans of beer per week   Allergies   Diclofenac and Prednisone  Review of Systems Review of Systems  Constitutional: Negative for chills and fever.  Gastrointestinal: Positive for abdominal pain and nausea. Negative for diarrhea and vomiting.  Genitourinary: Negative for difficulty urinating, dysuria, frequency, hematuria and urgency.  All other systems reviewed and are  negative.  Physical Exam Updated Vital Signs BP 134/92 (BP Location: Left Arm)   Pulse 95   Temp 97.8 F (36.6 C) (Oral)   Resp 18   Ht 6\' 1"  (1.854 m)   Wt 194 lb (88 kg)   SpO2 97%   BMI 25.60 kg/m   Physical Exam  Constitutional: He appears well-developed and well-nourished.  Appears uncomfortable.   HENT:  Head: Normocephalic.  Right Ear: External ear normal.  Left Ear: External ear normal.  Nose: Nose normal.  Eyes: Conjunctivae are normal. Right eye exhibits no discharge. Left eye exhibits no discharge.  Neck: Normal range of motion.  Cardiovascular: Normal rate, regular rhythm and normal heart sounds.   No murmur heard. Pulmonary/Chest: Effort normal and breath sounds normal. No respiratory distress. He has no wheezes. He has no rales.  Abdominal: Soft. There is tenderness. There is no rebound and no guarding.  Tender in the epigastrium and RUQ.   Musculoskeletal: Normal range of motion. He exhibits no edema or tenderness.  Neurological: He is alert. No cranial nerve deficit. Coordination normal.  Skin: Skin is warm and dry. No rash noted. No erythema. No pallor.  Psychiatric: He has a normal mood and affect. His behavior is normal.  Nursing note and vitals reviewed.  ED Treatments / Results  DIAGNOSTIC STUDIES: Oxygen Saturation is 97% on RA, normal by my interpretation.   COORDINATION OF CARE: 5:25 PM-Discussed next steps with pt. Pt verbalized understanding and is agreeable with the plan.   Labs (all labs ordered are listed, but only abnormal results are displayed) Labs Reviewed  LIPASE, BLOOD - Abnormal; Notable for the following:       Result Value   Lipase 176 (*)    All other components within normal limits  COMPREHENSIVE METABOLIC PANEL - Abnormal; Notable for the following:    Glucose, Bld 126 (*)    BUN 5 (*)    All other components within normal limits  URINALYSIS, ROUTINE W REFLEX MICROSCOPIC - Abnormal; Notable for the following:    Hgb  urine dipstick SMALL (*)    Protein, ur 30 (*)    Bacteria, UA RARE (*)    Squamous Epithelial / LPF 0-5 (*)    All other components within normal limits  BASIC METABOLIC PANEL - Abnormal; Notable for the following:    Glucose, Bld 128 (*)    Calcium 8.8 (*)    All other components within normal limits  GLUCOSE, CAPILLARY - Abnormal; Notable for the following:    Glucose-Capillary 122 (*)    All other components within normal limits  GLUCOSE, CAPILLARY - Abnormal; Notable for the following:    Glucose-Capillary 112 (*)    All other components within normal limits  CBC - Abnormal; Notable for the following:    RBC 3.87 (*)    Hemoglobin 11.9 (*)    HCT 36.6 (*)    All other components within normal limits  BASIC METABOLIC PANEL - Abnormal; Notable for the following:    Glucose, Bld 102 (*)    BUN <5 (*)  Calcium 8.6 (*)    All other components within normal limits  GLUCOSE, CAPILLARY - Abnormal; Notable for the following:    Glucose-Capillary 107 (*)    All other components within normal limits  CBC  CBC  HEPATIC FUNCTION PANEL  TROPONIN I  TRIGLYCERIDES  GLUCOSE, CAPILLARY  GLUCOSE, CAPILLARY  GLUCOSE, CAPILLARY  GLUCOSE, CAPILLARY  GLUCOSE, CAPILLARY   EKG  EKG Interpretation  Date/Time:  Sunday June 24 2016 18:08:51 EDT Ventricular Rate:  79 PR Interval:    QRS Duration: 88 QT Interval:  355 QTC Calculation: 407 R Axis:   38 Text Interpretation:  Sinus rhythm Probable left atrial enlargement Lateral infarct, acute (LAD) ST elevation, consider inferior injury Confirmed by [facility] MD, JULIE ([id number]) on 06/25/2016 5:14:49 PM      Radiology No results found.   Ct Abdomen Pelvis W Contrast  Result Date: 06/24/2016 CLINICAL DATA:  Abdominal pain.  History pancreatitis. EXAM: CT ABDOMEN AND PELVIS WITH CONTRAST TECHNIQUE: Multidetector CT imaging of the abdomen and pelvis was performed using the standard protocol following bolus administration of intravenous  contrast. CONTRAST:  112mL ISOVUE-300 IOPAMIDOL (ISOVUE-300) INJECTION 61% COMPARISON:  11/23/2015 FINDINGS: Lower chest:  Basilar atelectasis. Hepatobiliary: Tiny low-density lesion posterior right liver too small to characterize but likely a cyst. There is no evidence for gallstones, gallbladder wall thickening, or pericholecystic fluid. No intrahepatic or extrahepatic biliary dilation. Pancreas: Diffuse peripancreatic edema/inflammation is evident, most prominently around the pancreatic head and body. Pancreatic parenchyma enhances throughout. There is no peripancreatic fluid collection. Spleen: No splenomegaly. No focal mass lesion. Adrenals/Urinary Tract: No adrenal nodule or mass. Right kidney is markedly atrophic. There is at least probable small nonobstructing left renal stones although early excretion of contrast hinders assessment. No enhancing left renal mass. Insert normal ureter bladder is decompressed. Stomach/Bowel: Stomach is distended. Edema/inflammation is identified around the descending duodenum. No small bowel wall thickening. No small bowel dilatation. The terminal ileum is normal. The appendix is normal. Right-sided colonic diverticulosis noted without diverticulitis. Vascular/Lymphatic: No abdominal aortic aneurysm. No abdominal aortic atherosclerotic calcification. Portal vein is patent. Superior mesenteric vein is patent. Splenic vein is patent. Celiac axis and SMA are unremarkable. There is no gastrohepatic or hepatoduodenal ligament lymphadenopathy. No intraperitoneal or retroperitoneal lymphadenopathy. No pelvic sidewall lymphadenopathy. Reproductive: Prostate gland is unremarkable. Other: No intraperitoneal free fluid. Musculoskeletal: Lucency in the right iliac bone is stable. Bone windows reveal no worrisome lytic or sclerotic osseous lesions. IMPRESSION: 1. Peripancreatic edema/inflammation, mainly in the region of the body and head. Imaging features compatible with pancreatitis. No  evidence for pancreatic necrosis. No evidence for pseudocyst. 2. Markedly atrophied right kidney. 3. Probable left renal stone although early excretion of contrast hinders assessment. Electronically Signed   By: Misty Stanley M.D.   On: 06/24/2016 20:56   Dg Chest Port 1 View  Result Date: 06/25/2016 CLINICAL DATA:  Epigastric pain.  History of hypertension. EXAM: PORTABLE CHEST 1 VIEW COMPARISON:  10/01/2015 FINDINGS: Shallow inspiration with linear atelectasis in both lung bases. Heart size and pulmonary vascularity are normal. No blunting of costophrenic angles. No pneumothorax. Mediastinal contours are intact. IMPRESSION: Shallow inspiration with linear atelectasis in both lung bases. Electronically Signed   By: Lucienne Capers M.D.   On: 06/25/2016 06:14    Procedures Procedures   Medications Ordered in ED Medications - No data to display  Initial Impression / Assessment and Plan / ED Course  I have reviewed the triage vital signs and the nursing notes.  Pertinent labs &  imaging results that were available during my care of the patient were reviewed by me and considered in my medical decision making (see chart for details).     45yM with abdominal pain. Likely from etoh induced pancreatitis. HD stable, but will admit for ongoing symptom control.   Final Clinical Impressions(s) / ED Diagnoses   Final diagnoses:  Epigastric pain   New Prescriptions New Prescriptions   No medications on file   I personally preformed the services scribed in my presence. The recorded information has been reviewed is accurate. Kyle Manifold, MD.     Kyle Manifold, MD 07/05/16 [phone]

## 2016-06-25 ENCOUNTER — Encounter (HOSPITAL_COMMUNITY): Payer: Self-pay | Admitting: Internal Medicine

## 2016-06-25 ENCOUNTER — Observation Stay (HOSPITAL_COMMUNITY): Payer: Self-pay

## 2016-06-25 DIAGNOSIS — I10 Essential (primary) hypertension: Secondary | ICD-10-CM

## 2016-06-25 DIAGNOSIS — K852 Alcohol induced acute pancreatitis without necrosis or infection: Principal | ICD-10-CM

## 2016-06-25 DIAGNOSIS — F1010 Alcohol abuse, uncomplicated: Secondary | ICD-10-CM

## 2016-06-25 LAB — HEPATIC FUNCTION PANEL
ALT: 20 U/L (ref 17–63)
AST: 20 U/L (ref 15–41)
Albumin: 3.5 g/dL (ref 3.5–5.0)
Alkaline Phosphatase: 47 U/L (ref 38–126)
BILIRUBIN INDIRECT: 0.7 mg/dL (ref 0.3–0.9)
Bilirubin, Direct: 0.2 mg/dL (ref 0.1–0.5)
TOTAL PROTEIN: 7.1 g/dL (ref 6.5–8.1)
Total Bilirubin: 0.9 mg/dL (ref 0.3–1.2)

## 2016-06-25 LAB — GLUCOSE, CAPILLARY
GLUCOSE-CAPILLARY: 122 mg/dL — AB (ref 65–99)
Glucose-Capillary: 112 mg/dL — ABNORMAL HIGH (ref 65–99)
Glucose-Capillary: 89 mg/dL (ref 65–99)

## 2016-06-25 LAB — TRIGLYCERIDES: Triglycerides: 121 mg/dL (ref <150)

## 2016-06-25 LAB — TROPONIN I

## 2016-06-25 LAB — CBC
HCT: 41.5 % (ref 39.0–52.0)
Hemoglobin: 13.6 g/dL (ref 13.0–17.0)
MCH: 30.8 pg (ref 26.0–34.0)
MCHC: 32.8 g/dL (ref 30.0–36.0)
MCV: 93.9 fL (ref 78.0–100.0)
PLATELETS: 196 10*3/uL (ref 150–400)
RBC: 4.42 MIL/uL (ref 4.22–5.81)
RDW: 13.7 % (ref 11.5–15.5)
WBC: 9.2 10*3/uL (ref 4.0–10.5)

## 2016-06-25 LAB — BASIC METABOLIC PANEL
Anion gap: 8 (ref 5–15)
BUN: 6 mg/dL (ref 6–20)
CALCIUM: 8.8 mg/dL — AB (ref 8.9–10.3)
CO2: 25 mmol/L (ref 22–32)
CREATININE: 1.15 mg/dL (ref 0.61–1.24)
Chloride: 103 mmol/L (ref 101–111)
GFR calc Af Amer: >60 mL/min (ref >60)
GFR calc non Af Amer: >60 mL/min (ref >60)
GLUCOSE: 128 mg/dL — AB (ref 65–99)
Potassium: 3.9 mmol/L (ref 3.5–5.1)
Sodium: 136 mmol/L (ref 135–145)

## 2016-06-25 MED ORDER — LORAZEPAM 2 MG/ML IJ SOLN
1.0000 mg | Freq: Four times a day (QID) | INTRAMUSCULAR | Status: DC | PRN
  Administered 2016-06-25: 1 mg via INTRAVENOUS

## 2016-06-25 MED ORDER — HYDROMORPHONE HCL 1 MG/ML IJ SOLN
0.5000 mg | INTRAMUSCULAR | Status: DC | PRN
  Administered 2016-06-25 – 2016-06-27 (×10): 0.5 mg via INTRAVENOUS
  Filled 2016-06-25 (×11): qty 1

## 2016-06-25 MED ORDER — SODIUM CHLORIDE 0.9 % IV SOLN
INTRAVENOUS | Status: AC
  Administered 2016-06-25 (×3): via INTRAVENOUS

## 2016-06-25 MED ORDER — LORAZEPAM 1 MG PO TABS
1.0000 mg | ORAL_TABLET | Freq: Four times a day (QID) | ORAL | Status: DC | PRN
  Administered 2016-06-26: 1 mg via ORAL
  Filled 2016-06-25: qty 1

## 2016-06-25 MED ORDER — ADULT MULTIVITAMIN W/MINERALS CH
1.0000 | ORAL_TABLET | Freq: Every day | ORAL | Status: DC
  Administered 2016-06-26 – 2016-06-27 (×2): 1 via ORAL
  Filled 2016-06-25 (×2): qty 1

## 2016-06-25 MED ORDER — HYDROMORPHONE HCL 2 MG/ML IJ SOLN: 0.5000 mg | INTRAMUSCULAR | Status: DC | PRN

## 2016-06-25 MED ORDER — ONDANSETRON HCL 4 MG PO TABS: 4.0000 mg | ORAL_TABLET | Freq: Four times a day (QID) | ORAL | Status: DC | PRN

## 2016-06-25 MED ORDER — FOLIC ACID 1 MG PO TABS
1.0000 mg | ORAL_TABLET | Freq: Every day | ORAL | Status: DC
  Administered 2016-06-26 – 2016-06-27 (×2): 1 mg via ORAL
  Filled 2016-06-25 (×2): qty 1

## 2016-06-25 MED ORDER — ACETAMINOPHEN 325 MG PO TABS: 650.0000 mg | ORAL_TABLET | Freq: Four times a day (QID) | ORAL | Status: DC | PRN

## 2016-06-25 MED ORDER — VITAMIN B-1 100 MG PO TABS
100.0000 mg | ORAL_TABLET | Freq: Every day | ORAL | Status: DC
  Administered 2016-06-26 – 2016-06-27 (×2): 100 mg via ORAL
  Filled 2016-06-25 (×2): qty 1

## 2016-06-25 MED ORDER — LORAZEPAM 2 MG/ML IJ SOLN
0.0000 mg | Freq: Four times a day (QID) | INTRAMUSCULAR | Status: AC
  Filled 2016-06-25: qty 1

## 2016-06-25 MED ORDER — HYDRALAZINE HCL 20 MG/ML IJ SOLN: 10.0000 mg | INTRAMUSCULAR | Status: DC | PRN

## 2016-06-25 MED ORDER — ACETAMINOPHEN 650 MG RE SUPP: 650.0000 mg | Freq: Four times a day (QID) | RECTAL | Status: DC | PRN

## 2016-06-25 MED ORDER — LORAZEPAM 2 MG/ML IJ SOLN: 0.0000 mg | Freq: Two times a day (BID) | INTRAMUSCULAR | Status: DC

## 2016-06-25 MED ORDER — ONDANSETRON HCL 4 MG/2ML IJ SOLN: 4.0000 mg | Freq: Four times a day (QID) | INTRAMUSCULAR | Status: DC | PRN

## 2016-06-25 MED ORDER — THIAMINE HCL 100 MG/ML IJ SOLN
100.0000 mg | Freq: Every day | INTRAMUSCULAR | Status: DC
  Administered 2016-06-25: 100 mg via INTRAVENOUS
  Filled 2016-06-25: qty 2

## 2016-06-25 NOTE — ED Notes (Signed)
Attempted to call report

## 2016-06-25 NOTE — H&P (Signed)
History and Physical    DILLON LIVERMORE JOI:[id number] DOB: [birth year] DOA: 06/24/2016  PCP: [facility]  Patient coming from: Home.  Chief Complaint: Abdominal pain.  HPI: Kyle Berry is a 46 y.o. male with history of alcoholism, hypertension, GERD presents to the ER because of abdominal pain. Patient has been experiencing abdominal pain mostly in the epigastric area for the last 2 days. Denies any vomiting has been having nausea and unable to eat well because of the pain. Denies any diarrhea. Admits to drinking alcohol daily. Denies any chest pain or shortness of breath fever or chills.   ED Course: In the ER CAT scan of the abdomen and pelvis done shows features concerning for acute pancreatitis. Patient has been admitted for further management.  Review of Systems: As per HPI, rest all negative.   Past Medical History:  Diagnosis Date  . Acute pancreatitis 11/23/2015  . Chronic bronchitis ([place])   . Chronic lower back pain   . Congenital single kidney   . GERD (gastroesophageal reflux disease)   . High cholesterol   . Hypertension   . Kidney calculi   . Migraines    "once/month" (11/23/2015)  . Nephrolithiasis   . Pneumonia ~ 09/2015  . Renal disorder     Past Surgical History:  Procedure Laterality Date  . NO PAST SURGERIES       reports that he has never smoked. He has never used smokeless tobacco. He reports that he drinks about 3.6 oz of alcohol per week . He reports that he does not use drugs.  Allergies  Allergen Reactions  . Diclofenac Hives  . Prednisone Other (See Comments)    Mood swings    Family History  Problem Relation Age of Onset  . Hypertension Other     Prior to Admission medications   Medication Sig Start Date End Date Taking? Authorizing Provider  acetaminophen (TYLENOL) 500 MG tablet Take 1,000 mg by mouth every 6 (six) hours as needed for headache.   Yes Historical Provider, MD  amLODipine (NORVASC) 5 MG  tablet Take 1 tablet (5 mg total) by mouth daily. 10/01/15 06/24/16 Yes Junius Creamer, NP  omeprazole (PRILOSEC) 20 MG capsule Take 20 mg by mouth daily as needed (heart burn).   Yes Historical Provider, MD  ondansetron (ZOFRAN ODT) 4 MG disintegrating tablet Take 1 tablet (4 mg total) by mouth every 8 (eight) hours as needed for nausea or vomiting. Patient not taking: Reported on 06/24/2016 12/13/15   Shawn C Joy, PA-C  oxyCODONE (OXY IR/ROXICODONE) 5 MG immediate release tablet Take 1 tablet (5 mg total) by mouth every 4 (four) hours as needed for severe pain. Patient not taking: Reported on 12/06/2015 11/26/15   Sela Hua, MD    Physical Exam: Vitals:   06/24/16 2115 06/24/16 2130 06/24/16 2145 06/24/16 2200  BP: 122/90 135/87 128/93 128/89  Pulse: 68 80 82 86  Resp: 19 15 21 25   Temp:      TempSrc:      SpO2: 93% 93% 94% 94%  Weight:      Height:          Constitutional: Moderately built and nourished. Vitals:   06/24/16 2115 06/24/16 2130 06/24/16 2145 06/24/16 2200  BP: 122/90 135/87 128/93 128/89  Pulse: 68 80 82 86  Resp: 19 15 21 25   Temp:      TempSrc:      SpO2: 93% 93% 94% 94%  Weight:  Height:       Eyes: Anicteric no pallor. ENMT: No discharge from the ears eyes nose or mouth. Neck: No mass felt. No neck rigidity. No JVD appreciated. Respiratory: No rhonchi or crepitations. Cardiovascular: S1-S2 heard no murmurs appreciated. Abdomen: Soft mild epigastric tenderness no guarding or rigidity. Musculoskeletal: No edema no joint effusion. Skin: No rash. Neurologic: Alert and awake oriented to time place and person. Moves all extremities. Psychiatric: Appears normal. Normal affect.   Labs on Admission: I have personally reviewed following labs and imaging studies  CBC:  Recent Labs Lab 06/24/16 1555  WBC 10.5  HGB 15.3  HCT 45.0  MCV 93.8  PLT 502   Basic Metabolic Panel:  Recent Labs Lab 06/24/16 1555  NA 135  K 3.7  CL 102  CO2 24    GLUCOSE 126*  BUN 5*  CREATININE 1.05  CALCIUM 9.4   GFR: Estimated Creatinine Clearance: 99.3 mL/min (by C-G formula based on SCr of 1.05 mg/dL). Liver Function Tests:  Recent Labs Lab 06/24/16 1555  AST 31  ALT 26  ALKPHOS 59  BILITOT 1.1  PROT 7.8  ALBUMIN 3.9    Recent Labs Lab 06/24/16 1555  LIPASE 176*   No results for input(s): AMMONIA in the last 168 hours. Coagulation Profile: No results for input(s): INR, PROTIME in the last 168 hours. Cardiac Enzymes: No results for input(s): CKTOTAL, CKMB, CKMBINDEX, TROPONINI in the last 168 hours. BNP (last 3 results) No results for input(s): PROBNP in the last 8760 hours. HbA1C: No results for input(s): HGBA1C in the last 72 hours. CBG: No results for input(s): GLUCAP in the last 168 hours. Lipid Profile: No results for input(s): CHOL, HDL, LDLCALC, TRIG, CHOLHDL, LDLDIRECT in the last 72 hours. Thyroid Function Tests: No results for input(s): TSH, T4TOTAL, FREET4, T3FREE, THYROIDAB in the last 72 hours. Anemia Panel: No results for input(s): VITAMINB12, FOLATE, FERRITIN, TIBC, IRON, RETICCTPCT in the last 72 hours. Urine analysis:    Component Value Date/Time   COLORURINE YELLOW 06/24/2016 1925   APPEARANCEUR CLEAR 06/24/2016 1925   LABSPEC 1.021 06/24/2016 1925   PHURINE 5.0 06/24/2016 1925   GLUCOSEU NEGATIVE 06/24/2016 1925   HGBUR SMALL (A) 06/24/2016 1925   BILIRUBINUR NEGATIVE 06/24/2016 1925   KETONESUR NEGATIVE 06/24/2016 1925   PROTEINUR 30 (A) 06/24/2016 1925   UROBILINOGEN 0.2 01/20/2015 1643   NITRITE NEGATIVE 06/24/2016 1925   LEUKOCYTESUR NEGATIVE 06/24/2016 1925   Sepsis Labs: @LABRCNTIP (procalcitonin:4,lacticidven:4) )No results found for this or any previous visit (from the past 240 hour(s)).   Radiological Exams on Admission: Ct Abdomen Pelvis W Contrast  Result Date: 06/24/2016 CLINICAL DATA:  Abdominal pain.  History pancreatitis. EXAM: CT ABDOMEN AND PELVIS WITH CONTRAST  TECHNIQUE: Multidetector CT imaging of the abdomen and pelvis was performed using the standard protocol following bolus administration of intravenous contrast. CONTRAST:  148mL ISOVUE-300 IOPAMIDOL (ISOVUE-300) INJECTION 61% COMPARISON:  11/23/2015 FINDINGS: Lower chest:  Basilar atelectasis. Hepatobiliary: Tiny low-density lesion posterior right liver too small to characterize but likely a cyst. There is no evidence for gallstones, gallbladder wall thickening, or pericholecystic fluid. No intrahepatic or extrahepatic biliary dilation. Pancreas: Diffuse peripancreatic edema/inflammation is evident, most prominently around the pancreatic head and body. Pancreatic parenchyma enhances throughout. There is no peripancreatic fluid collection. Spleen: No splenomegaly. No focal mass lesion. Adrenals/Urinary Tract: No adrenal nodule or mass. Right kidney is markedly atrophic. There is at least probable small nonobstructing left renal stones although early excretion of contrast hinders assessment. No enhancing left  renal mass. Insert normal ureter bladder is decompressed. Stomach/Bowel: Stomach is distended. Edema/inflammation is identified around the descending duodenum. No small bowel wall thickening. No small bowel dilatation. The terminal ileum is normal. The appendix is normal. Right-sided colonic diverticulosis noted without diverticulitis. Vascular/Lymphatic: No abdominal aortic aneurysm. No abdominal aortic atherosclerotic calcification. Portal vein is patent. Superior mesenteric vein is patent. Splenic vein is patent. Celiac axis and SMA are unremarkable. There is no gastrohepatic or hepatoduodenal ligament lymphadenopathy. No intraperitoneal or retroperitoneal lymphadenopathy. No pelvic sidewall lymphadenopathy. Reproductive: Prostate gland is unremarkable. Other: No intraperitoneal free fluid. Musculoskeletal: Lucency in the right iliac bone is stable. Bone windows reveal no worrisome lytic or sclerotic osseous  lesions. IMPRESSION: 1. Peripancreatic edema/inflammation, mainly in the region of the body and head. Imaging features compatible with pancreatitis. No evidence for pancreatic necrosis. No evidence for pseudocyst. 2. Markedly atrophied right kidney. 3. Probable left renal stone although early excretion of contrast hinders assessment. Electronically Signed   By: Misty Stanley M.D.   On: 06/24/2016 20:56    EKG: Independently reviewed. Normal sinus rhythm with nonspecific ST changes.  Assessment/Plan Principal Problem:   Acute pancreatitis Active Problems:   Essential hypertension   Esophageal reflux   Alcohol abuse    1. Acute pancreatitis most likely from alcoholism - patient had a similar episode last year. Patient at this time will be kept nothing by mouth and on IV fluids and empiric medications. Check triglyceride levels. CAT scan does not show any gallstones. Patient's pancreatitis most likely from alcoholism. Troponin and chest x-ray is pending. 2. Alcohol abuse - patient advised to quit drinking alcohol. Patient is on CIWA protocol. 3. Hypertension - since patient is nothing by mouth I have placed patient on when necessary IV hydralazine. 4. History of GERD.   DVT prophylaxis: SCDs. Code Status: Full code.  Family Communication: Discussed with patient.  Disposition Plan: Home.  Consults called: None.  Admission status: Observation.    Rise Patience MD [facility] Pager [phone].  If 7PM-7AM, please contact night-coverage [email or web address] Password [facility]  06/25/2016, 12:06 AM

## 2016-06-25 NOTE — Progress Notes (Signed)
Patient admitted after midnight, please see H&P.  Pancreatitis due to alcohol.  Still with some epigastric tenderness.  Will increase fluid and perhaps start clear liquid diet if pain improved.  Eulogio Bear DO

## 2016-06-26 LAB — GLUCOSE, CAPILLARY
GLUCOSE-CAPILLARY: 75 mg/dL (ref 65–99)
GLUCOSE-CAPILLARY: 78 mg/dL (ref 65–99)

## 2016-06-26 MED ORDER — SODIUM CHLORIDE 0.9 % IV SOLN
INTRAVENOUS | Status: AC
  Administered 2016-06-26 (×2): via INTRAVENOUS

## 2016-06-26 NOTE — Progress Notes (Signed)
Pt tolerated breakfast and lunch trays, clear liquids, well. No reports of pain or discomfort. Will progress diet to full liquids per MD order and continue to monitor    Kyle Berry

## 2016-06-26 NOTE — Progress Notes (Signed)
PROGRESS NOTE    Kyle Berry  JKD:[id number] DOB: 07/25/1970 DOA: 06/24/2016 PCP: [facility]   Outpatient Specialists:    Brief Narrative:  Kyle Berry is a 46 y.o. male with history of alcoholism, hypertension, GERD presents to the ER because of abdominal pain. Patient has been experiencing abdominal pain mostly in the epigastric area for the last 2 days. Denies any vomiting has been having nausea and unable to eat well because of the pain. Denies any diarrhea. Admits to drinking alcohol daily. Denies any chest pain or shortness of breath fever or chills.    Assessment & Plan:   Principal Problem:   Acute pancreatitis Active Problems:   Essential hypertension   Esophageal reflux   Alcohol abuse   Acute pancreatitis most likely from alcoholism  -patient had a similar episode last year.  - IV fluids -advance diet slowly  Alcohol abuse  - patient advised to quit drinking alcohol. Patient is on CIWA protocol.  Hypertension  -PRN IV hydralazine. -BP low normal  History of GERD.   DVT prophylaxis:  SCD's  Code Status: Full Code   Family Communication:   Disposition Plan:  Home 1-2 days      Subjective: Some pain after clears No nausea  Objective: Vitals:   06/25/16 1239 06/25/16 1818 06/25/16 2011 06/26/16 0642  BP: 109/69 112/67 123/79 119/78  Pulse: 74 80 73 98  Resp:   20 16  Temp:   99 F (37.2 C) 98.5 F (36.9 C)  TempSrc:   Oral Oral  SpO2: 96%  99% 96%  Weight:    81.1 kg (178 lb 12.8 oz)  Height:        Intake/Output Summary (Last 24 hours) at 06/26/16 1210 Last data filed at 06/26/16 0998  Gross per 24 hour  Intake          3189.58 ml  Output                0 ml  Net          3189.58 ml   Filed Weights   06/24/16 1554 06/25/16 0136 06/26/16 0642  Weight: 88 kg (194 lb) 80.6 kg (177 lb 12.8 oz) 81.1 kg (178 lb 12.8 oz)    Examination:  General exam: Appears calm and comfortable    Respiratory system: Clear to auscultation. Respiratory effort normal. Cardiovascular system: S1 & S2 heard, RRR. No JVD, murmurs, rubs, gallops or clicks. No pedal edema. Gastrointestinal system: Abdomen is nondistended, soft and less tender today. No organomegaly or masses felt. Normal bowel sounds heard. Central nervous system: Alert and oriented. No focal neurological deficits. Extremities: Symmetric 5 x 5 power.     Data Reviewed: I have personally reviewed following labs and imaging studies  CBC:  Recent Labs Lab 06/24/16 1555 06/25/16 0528  WBC 10.5 9.2  HGB 15.3 13.6  HCT 45.0 41.5  MCV 93.8 93.9  PLT 207 338   Basic Metabolic Panel:  Recent Labs Lab 06/24/16 1555 06/25/16 0528  NA 135 136  K 3.7 3.9  CL 102 103  CO2 24 25  GLUCOSE 126* 128*  BUN 5* 6  CREATININE 1.05 1.15  CALCIUM 9.4 8.8*   GFR: Estimated Creatinine Clearance: 90.7 mL/min (by C-G formula based on SCr of 1.15 mg/dL). Liver Function Tests:  Recent Labs Lab 06/24/16 1555 06/25/16 0528  AST 31 20  ALT 26 20  ALKPHOS 59 47  BILITOT 1.1 0.9  PROT 7.8 7.1  ALBUMIN 3.9 3.5    Recent Labs Lab 06/24/16 1555  LIPASE 176*   No results for input(s): AMMONIA in the last 168 hours. Coagulation Profile: No results for input(s): INR, PROTIME in the last 168 hours. Cardiac Enzymes:  Recent Labs Lab 06/25/16 0528  TROPONINI <0.03   BNP (last 3 results) No results for input(s): PROBNP in the last 8760 hours. HbA1C: No results for input(s): HGBA1C in the last 72 hours. CBG:  Recent Labs Lab 06/25/16 0746 06/25/16 1241 06/25/16 1630 06/26/16 0751  GLUCAP 122* 112* 89 75   Lipid Profile:  Recent Labs  06/25/16 0528  TRIG 121   Thyroid Function Tests: No results for input(s): TSH, T4TOTAL, FREET4, T3FREE, THYROIDAB in the last 72 hours. Anemia Panel: No results for input(s): VITAMINB12, FOLATE, FERRITIN, TIBC, IRON, RETICCTPCT in the last 72 hours. Urine analysis:     Component Value Date/Time   COLORURINE YELLOW 06/24/2016 1925   APPEARANCEUR CLEAR 06/24/2016 1925   LABSPEC 1.021 06/24/2016 1925   PHURINE 5.0 06/24/2016 1925   GLUCOSEU NEGATIVE 06/24/2016 1925   HGBUR SMALL (A) 06/24/2016 1925   BILIRUBINUR NEGATIVE 06/24/2016 1925   KETONESUR NEGATIVE 06/24/2016 1925   PROTEINUR 30 (A) 06/24/2016 1925   UROBILINOGEN 0.2 01/20/2015 1643   NITRITE NEGATIVE 06/24/2016 1925   LEUKOCYTESUR NEGATIVE 06/24/2016 1925     )No results found for this or any previous visit (from the past 240 hour(s)).    Anti-infectives    None       Radiology Studies: Ct Abdomen Pelvis W Contrast  Result Date: 06/24/2016 CLINICAL DATA:  Abdominal pain.  History pancreatitis. EXAM: CT ABDOMEN AND PELVIS WITH CONTRAST TECHNIQUE: Multidetector CT imaging of the abdomen and pelvis was performed using the standard protocol following bolus administration of intravenous contrast. CONTRAST:  131mL ISOVUE-300 IOPAMIDOL (ISOVUE-300) INJECTION 61% COMPARISON:  11/23/2015 FINDINGS: Lower chest:  Basilar atelectasis. Hepatobiliary: Tiny low-density lesion posterior right liver too small to characterize but likely a cyst. There is no evidence for gallstones, gallbladder wall thickening, or pericholecystic fluid. No intrahepatic or extrahepatic biliary dilation. Pancreas: Diffuse peripancreatic edema/inflammation is evident, most prominently around the pancreatic head and body. Pancreatic parenchyma enhances throughout. There is no peripancreatic fluid collection. Spleen: No splenomegaly. No focal mass lesion. Adrenals/Urinary Tract: No adrenal nodule or mass. Right kidney is markedly atrophic. There is at least probable small nonobstructing left renal stones although early excretion of contrast hinders assessment. No enhancing left renal mass. Insert normal ureter bladder is decompressed. Stomach/Bowel: Stomach is distended. Edema/inflammation is identified around the descending duodenum.  No small bowel wall thickening. No small bowel dilatation. The terminal ileum is normal. The appendix is normal. Right-sided colonic diverticulosis noted without diverticulitis. Vascular/Lymphatic: No abdominal aortic aneurysm. No abdominal aortic atherosclerotic calcification. Portal vein is patent. Superior mesenteric vein is patent. Splenic vein is patent. Celiac axis and SMA are unremarkable. There is no gastrohepatic or hepatoduodenal ligament lymphadenopathy. No intraperitoneal or retroperitoneal lymphadenopathy. No pelvic sidewall lymphadenopathy. Reproductive: Prostate gland is unremarkable. Other: No intraperitoneal free fluid. Musculoskeletal: Lucency in the right iliac bone is stable. Bone windows reveal no worrisome lytic or sclerotic osseous lesions. IMPRESSION: 1. Peripancreatic edema/inflammation, mainly in the region of the body and head. Imaging features compatible with pancreatitis. No evidence for pancreatic necrosis. No evidence for pseudocyst. 2. Markedly atrophied right kidney. 3. Probable left renal stone although early excretion of contrast hinders assessment. Electronically Signed   By: Misty Stanley M.D.   On: 06/24/2016 20:56   Dg  Chest Port 1 View  Result Date: 06/25/2016 CLINICAL DATA:  Epigastric pain.  History of hypertension. EXAM: PORTABLE CHEST 1 VIEW COMPARISON:  10/01/2015 FINDINGS: Shallow inspiration with linear atelectasis in both lung bases. Heart size and pulmonary vascularity are normal. No blunting of costophrenic angles. No pneumothorax. Mediastinal contours are intact. IMPRESSION: Shallow inspiration with linear atelectasis in both lung bases. Electronically Signed   By: Lucienne Capers M.D.   On: 06/25/2016 06:14        Scheduled Meds: . folic acid  1 mg Oral Daily  . LORazepam  0-4 mg Intravenous Q6H   Followed by  . [START ON 06/27/2016] LORazepam  0-4 mg Intravenous Q12H  . multivitamin with minerals  1 tablet Oral Daily  . thiamine  100 mg Oral Daily    Or  . thiamine  100 mg Intravenous Daily   Continuous Infusions: . sodium chloride 150 mL/hr at 06/26/16 0914     LOS: 1 day    Time spent: 25 min    [place], DO [facility] Pager [phone]  If 7PM-7AM, please contact night-coverage [email or web address] Password [email or web address] 06/26/2016, 12:10 PM

## 2016-06-26 NOTE — Progress Notes (Signed)
Pt ambulated 300 feet in the hallway on room air. Tolerated well, good effort   Kyle Berry

## 2016-06-27 DIAGNOSIS — K219 Gastro-esophageal reflux disease without esophagitis: Secondary | ICD-10-CM

## 2016-06-27 LAB — GLUCOSE, CAPILLARY
Glucose-Capillary: 107 mg/dL — ABNORMAL HIGH (ref 65–99)
Glucose-Capillary: 82 mg/dL (ref 65–99)
Glucose-Capillary: 92 mg/dL (ref 65–99)

## 2016-06-27 LAB — BASIC METABOLIC PANEL
Anion gap: 6 (ref 5–15)
BUN: <5 mg/dL — ABNORMAL LOW (ref 6–20)
CALCIUM: 8.6 mg/dL — AB (ref 8.9–10.3)
CO2: 26 mmol/L (ref 22–32)
CREATININE: 1.09 mg/dL (ref 0.61–1.24)
Chloride: 104 mmol/L (ref 101–111)
Glucose, Bld: 102 mg/dL — ABNORMAL HIGH (ref 65–99)
Potassium: 3.8 mmol/L (ref 3.5–5.1)
SODIUM: 136 mmol/L (ref 135–145)

## 2016-06-27 LAB — CBC
HEMATOCRIT: 36.6 % — AB (ref 39.0–52.0)
Hemoglobin: 11.9 g/dL — ABNORMAL LOW (ref 13.0–17.0)
MCH: 30.7 pg (ref 26.0–34.0)
MCHC: 32.5 g/dL (ref 30.0–36.0)
MCV: 94.6 fL (ref 78.0–100.0)
PLATELETS: 190 10*3/uL (ref 150–400)
RBC: 3.87 MIL/uL — ABNORMAL LOW (ref 4.22–5.81)
RDW: 13.9 % (ref 11.5–15.5)
WBC: 7.3 10*3/uL (ref 4.0–10.5)

## 2016-06-27 NOTE — Discharge Summary (Signed)
Physician Discharge Summary  Kyle Berry QVZ:[id number] DOB: [birth year] DOA: 06/24/2016  PCP: [facility]  Admit date: 06/24/2016 Discharge date: 06/27/2016   Recommendations for Outpatient Follow-Up:   Alcohol cessation  Discharge Diagnosis:   Principal Problem:   Acute pancreatitis Active Problems:   Essential hypertension   Esophageal reflux   Alcohol abuse   Discharge disposition:  Home.   Discharge Condition: Improved.  Diet recommendation:   Regular.  Wound care: None.   History of Present Illness:   Kyle Berry is a 46 y.o. male with history of alcoholism, hypertension, GERD presents to the ER because of abdominal pain. Patient has been experiencing abdominal pain mostly in the epigastric area for the last 2 days. Denies any vomiting has been having nausea and unable to eat well because of the pain. Denies any diarrhea. Admits to drinking alcohol daily. Denies any chest pain or shortness of breath fever or chills.   ED Course: In the ER CAT scan of the abdomen and pelvis done shows features concerning for acute pancreatitis. Patient has been admitted for further management.   Hospital Course by Problem:   Acute pancreatitis most likely from alcoholism  -patient had a similar episode last year.  -tolerating a regular diet  Alcohol abuse  - patient advised to quit drinking alcohol -no signs of withdrawal  Hypertension  -BP normal off meds  History of GERD.  -continue home PPI   Medical Consultants:    None.   Discharge Exam:   Vitals:   06/27/16 0552 06/27/16 0942  BP: 135/72 114/79  Pulse: 80 89  Resp:    Temp: 99.1 F (37.3 C) 98.8 F (37.1 C)   Vitals:   06/26/16 2001 06/27/16 0552 06/27/16 0652 06/27/16 0942  BP: 124/79 135/72  114/79  Pulse: 93 80  89  Resp: 18     Temp: 98.6 F (37 C) 99.1 F (37.3 C)  98.8 F (37.1 C)  TempSrc: Oral Oral  Oral  SpO2: 97% 95%  97%  Weight:   81.1 kg  (178 lb 14.4 oz)   Height:        Gen:  NAD    The results of significant diagnostics from this hospitalization (including imaging, microbiology, ancillary and laboratory) are listed below for reference.     Procedures and Diagnostic Studies:   Ct Abdomen Pelvis W Contrast  Result Date: 06/24/2016 CLINICAL DATA:  Abdominal pain.  History pancreatitis. EXAM: CT ABDOMEN AND PELVIS WITH CONTRAST TECHNIQUE: Multidetector CT imaging of the abdomen and pelvis was performed using the standard protocol following bolus administration of intravenous contrast. CONTRAST:  164mL ISOVUE-300 IOPAMIDOL (ISOVUE-300) INJECTION 61% COMPARISON:  11/23/2015 FINDINGS: Lower chest:  Basilar atelectasis. Hepatobiliary: Tiny low-density lesion posterior right liver too small to characterize but likely a cyst. There is no evidence for gallstones, gallbladder wall thickening, or pericholecystic fluid. No intrahepatic or extrahepatic biliary dilation. Pancreas: Diffuse peripancreatic edema/inflammation is evident, most prominently around the pancreatic head and body. Pancreatic parenchyma enhances throughout. There is no peripancreatic fluid collection. Spleen: No splenomegaly. No focal mass lesion. Adrenals/Urinary Tract: No adrenal nodule or mass. Right kidney is markedly atrophic. There is at least probable small nonobstructing left renal stones although early excretion of contrast hinders assessment. No enhancing left renal mass. Insert normal ureter bladder is decompressed. Stomach/Bowel: Stomach is distended. Edema/inflammation is identified around the descending duodenum. No small bowel wall thickening. No small bowel dilatation. The terminal ileum is normal. The appendix is  normal. Right-sided colonic diverticulosis noted without diverticulitis. Vascular/Lymphatic: No abdominal aortic aneurysm. No abdominal aortic atherosclerotic calcification. Portal vein is patent. Superior mesenteric vein is patent. Splenic vein is  patent. Celiac axis and SMA are unremarkable. There is no gastrohepatic or hepatoduodenal ligament lymphadenopathy. No intraperitoneal or retroperitoneal lymphadenopathy. No pelvic sidewall lymphadenopathy. Reproductive: Prostate gland is unremarkable. Other: No intraperitoneal free fluid. Musculoskeletal: Lucency in the right iliac bone is stable. Bone windows reveal no worrisome lytic or sclerotic osseous lesions. IMPRESSION: 1. Peripancreatic edema/inflammation, mainly in the region of the body and head. Imaging features compatible with pancreatitis. No evidence for pancreatic necrosis. No evidence for pseudocyst. 2. Markedly atrophied right kidney. 3. Probable left renal stone although early excretion of contrast hinders assessment. Electronically Signed   By: Misty Stanley M.D.   On: 06/24/2016 20:56   Dg Chest Port 1 View  Result Date: 06/25/2016 CLINICAL DATA:  Epigastric pain.  History of hypertension. EXAM: PORTABLE CHEST 1 VIEW COMPARISON:  10/01/2015 FINDINGS: Shallow inspiration with linear atelectasis in both lung bases. Heart size and pulmonary vascularity are normal. No blunting of costophrenic angles. No pneumothorax. Mediastinal contours are intact. IMPRESSION: Shallow inspiration with linear atelectasis in both lung bases. Electronically Signed   By: Lucienne Capers M.D.   On: 06/25/2016 06:14     Labs:   Basic Metabolic Panel:  Recent Labs Lab 06/24/16 1555 06/25/16 0528 06/27/16 0511  NA 135 136 136  K 3.7 3.9 3.8  CL 102 103 104  CO2 24 25 26   GLUCOSE 126* 128* 102*  BUN 5* 6 <5*  CREATININE 1.05 1.15 1.09  CALCIUM 9.4 8.8* 8.6*   GFR Estimated Creatinine Clearance: 95.7 mL/min (by C-G formula based on SCr of 1.09 mg/dL). Liver Function Tests:  Recent Labs Lab 06/24/16 1555 06/25/16 0528  AST 31 20  ALT 26 20  ALKPHOS 59 47  BILITOT 1.1 0.9  PROT 7.8 7.1  ALBUMIN 3.9 3.5    Recent Labs Lab 06/24/16 1555  LIPASE 176*   No results for input(s):  AMMONIA in the last 168 hours. Coagulation profile No results for input(s): INR, PROTIME in the last 168 hours.  CBC:  Recent Labs Lab 06/24/16 1555 06/25/16 0528 06/27/16 0511  WBC 10.5 9.2 7.3  HGB 15.3 13.6 11.9*  HCT 45.0 41.5 36.6*  MCV 93.8 93.9 94.6  PLT 207 196 190   Cardiac Enzymes:  Recent Labs Lab 06/25/16 0528  TROPONINI <0.03   BNP: Invalid input(s): POCBNP CBG:  Recent Labs Lab 06/25/16 1630 06/26/16 0751 06/26/16 1712 06/27/16 0035 06/27/16 0547  GLUCAP 89 75 78 107* 82   D-Dimer No results for input(s): DDIMER in the last 72 hours. Hgb A1c No results for input(s): HGBA1C in the last 72 hours. Lipid Profile  Recent Labs  06/25/16 0528  TRIG 121   Thyroid function studies No results for input(s): TSH, T4TOTAL, T3FREE, THYROIDAB in the last 72 hours.  Invalid input(s): FREET3 Anemia work up No results for input(s): VITAMINB12, FOLATE, FERRITIN, TIBC, IRON, RETICCTPCT in the last 72 hours. Microbiology No results found for this or any previous visit (from the past 240 hour(s)).   Discharge Instructions:   Discharge Instructions    Diet general    Complete by:  As directed    Discharge instructions    Complete by:  As directed    Alcohol cessation   Increase activity slowly    Complete by:  As directed      Allergies as of 06/27/2016  Reactions   Diclofenac Hives   Prednisone Other (See Comments)   Mood swings      Medication List    STOP taking these medications   amLODipine 5 MG tablet Commonly known as:  NORVASC   ondansetron 4 MG disintegrating tablet Commonly known as:  ZOFRAN ODT   oxyCODONE 5 MG immediate release tablet Commonly known as:  Oxy IR/ROXICODONE     TAKE these medications   acetaminophen 500 MG tablet Commonly known as:  TYLENOL Take 1,000 mg by mouth every 6 (six) hours as needed for headache.   omeprazole 20 MG capsule Commonly known as:  PRILOSEC Take 20 mg by mouth daily as needed  (heart burn).      Follow-up Information    [facility] Follow up in 1 week(s).   Contact information: [address] [place] [address] [phone]            Time coordinating discharge: 35 min  Signed:  [place]   [facility] 06/27/2016, 11:44 AM

## 2016-06-27 NOTE — Progress Notes (Signed)
Pt discharged education went over at bedside. Pt IV discontinued, catheter intact and telemetry removed. Pt has all belongings and discharge paperwork, as well as, out of work letter from Dr. Ascencion Dike

## 2016-06-27 NOTE — Progress Notes (Signed)
Pt discharged via wheelchair with pt tech  Devany Aja

## 2016-08-15 ENCOUNTER — Emergency Department (HOSPITAL_COMMUNITY)
Admission: EM | Admit: 2016-08-15 | Discharge: 2016-08-15 | Disposition: A | Discharge status: Home / Self Care | Payer: Self-pay | Attending: Emergency Medicine | Admitting: Emergency Medicine

## 2016-08-15 ENCOUNTER — Encounter (HOSPITAL_COMMUNITY): Payer: Self-pay | Admitting: Emergency Medicine

## 2016-08-15 DIAGNOSIS — K859 Acute pancreatitis without necrosis or infection, unspecified: Secondary | ICD-10-CM | POA: Insufficient documentation

## 2016-08-15 DIAGNOSIS — Z79899 Other long term (current) drug therapy: Secondary | ICD-10-CM | POA: Insufficient documentation

## 2016-08-15 DIAGNOSIS — I10 Essential (primary) hypertension: Secondary | ICD-10-CM | POA: Insufficient documentation

## 2016-08-15 LAB — COMPREHENSIVE METABOLIC PANEL
ALT: 26 U/L (ref 17–63)
AST: 27 U/L (ref 15–41)
Albumin: 4.3 g/dL (ref 3.5–5.0)
Alkaline Phosphatase: 52 U/L (ref 38–126)
Anion gap: 10 (ref 5–15)
BILIRUBIN TOTAL: 1.6 mg/dL — AB (ref 0.3–1.2)
BUN: 10 mg/dL (ref 6–20)
CO2: 26 mmol/L (ref 22–32)
Calcium: 9.5 mg/dL (ref 8.9–10.3)
Chloride: 101 mmol/L (ref 101–111)
Creatinine, Ser: 1.27 mg/dL — ABNORMAL HIGH (ref 0.61–1.24)
GFR calc Af Amer: >60 mL/min (ref >60)
Glucose, Bld: 122 mg/dL — ABNORMAL HIGH (ref 65–99)
Potassium: 3.6 mmol/L (ref 3.5–5.1)
Sodium: 137 mmol/L (ref 135–145)
TOTAL PROTEIN: 7.3 g/dL (ref 6.5–8.1)

## 2016-08-15 LAB — URINALYSIS, ROUTINE W REFLEX MICROSCOPIC
BILIRUBIN URINE: NEGATIVE
Bacteria, UA: NONE SEEN
Glucose, UA: NEGATIVE mg/dL
Ketones, ur: 5 mg/dL — AB
LEUKOCYTES UA: NEGATIVE
NITRITE: NEGATIVE
PH: 5 (ref 5.0–8.0)
Protein, ur: NEGATIVE mg/dL
SPECIFIC GRAVITY, URINE: 1.026 (ref 1.005–1.030)

## 2016-08-15 LAB — CBC
HCT: 44.1 % (ref 39.0–52.0)
Hemoglobin: 14.8 g/dL (ref 13.0–17.0)
MCH: 31.3 pg (ref 26.0–34.0)
MCHC: 33.6 g/dL (ref 30.0–36.0)
MCV: 93.2 fL (ref 78.0–100.0)
Platelets: 181 10*3/uL (ref 150–400)
RBC: 4.73 MIL/uL (ref 4.22–5.81)
RDW: 13.7 % (ref 11.5–15.5)
WBC: 9.3 10*3/uL (ref 4.0–10.5)

## 2016-08-15 LAB — LIPASE, BLOOD: Lipase: 109 U/L — ABNORMAL HIGH (ref 11–51)

## 2016-08-15 MED ORDER — OXYCODONE-ACETAMINOPHEN 5-325 MG PO TABS: 1.0000 | ORAL_TABLET | Freq: Four times a day (QID) | ORAL | 0 refills | Status: DC | PRN

## 2016-08-15 MED ORDER — ONDANSETRON 4 MG PO TBDP: 4.0000 mg | ORAL_TABLET | Freq: Three times a day (TID) | ORAL | 0 refills | Status: DC | PRN

## 2016-08-15 MED ORDER — OXYCODONE-ACETAMINOPHEN 5-325 MG PO TABS
1.0000 | ORAL_TABLET | Freq: Once | ORAL | Status: AC
  Administered 2016-08-15: 1 via ORAL
  Filled 2016-08-15: qty 1

## 2016-08-15 MED ORDER — ONDANSETRON 4 MG PO TBDP
4.0000 mg | ORAL_TABLET | Freq: Once | ORAL | Status: AC
  Administered 2016-08-15: 4 mg via ORAL
  Filled 2016-08-15: qty 1

## 2016-08-15 NOTE — ED Triage Notes (Signed)
Patient reports upper abdominal pain with nausea , pain radiating to mid back onset today , he suspects pancreatitis flare up , denies emesis or diarrhea , no fever or chills .

## 2016-08-15 NOTE — ED Provider Notes (Signed)
[facility] DEPT Provider Note   CSN: [id number] Arrival date & time: 08/15/16  0152     History   Chief Complaint Chief Complaint  Patient presents with  . Abdominal Pain    HPI SIPRIANO Kyle Berry is a 46 y.o. male.  HPI  46 y.o. male with a hx of HTN, Pancreatitis, presents to the Emergency Department today complaining of upper abdominal pain with associated nausea with radiation into back today. Pt suspects pancreatitis flare up. Denies emesis or diarrhea. No fevers. No CP/SOB. Pt was admitted on 06-24-16 for same. CT scans at that time were negative for pseudocyst. No necrosis. Pt states pain not as bad as before, but wants to make sure everything is ok. Able to tolerate PO. No other symptoms noted.    Past Medical History:  Diagnosis Date  . Acute pancreatitis 11/23/2015  . Chronic bronchitis ([place])   . Chronic lower back pain   . Congenital single kidney   . GERD (gastroesophageal reflux disease)   . High cholesterol   . Hypertension   . Kidney calculi   . Migraines    "once/month" (11/23/2015)  . Nephrolithiasis   . Pneumonia ~ 09/2015  . Renal disorder     Patient Active Problem List   Diagnosis Date Noted  . Alcohol abuse   . Essential hypertension   . Esophageal reflux   . Acute pancreatitis 11/23/2015    Past Surgical History:  Procedure Laterality Date  . NO PAST SURGERIES         Home Medications    Prior to Admission medications   Medication Sig Start Date End Date Taking? Authorizing Provider  acetaminophen (TYLENOL) 500 MG tablet Take 1,000 mg by mouth every 6 (six) hours as needed for headache.    Historical Provider, MD  omeprazole (PRILOSEC) 20 MG capsule Take 20 mg by mouth daily as needed (heart burn).    Historical Provider, MD    Family History Family History  Problem Relation Age of Onset  . Hypertension Other     Social History Social History  Substance Use Topics  . Smoking status: Never Smoker  . Smokeless tobacco: Never  Used  . Alcohol use 3.6 oz/week    6 Cans of beer per week     Allergies   Diclofenac; Norvasc [amlodipine besylate]; Omeprazole; and Prednisone   Review of Systems Review of Systems ROS reviewed and all are negative for acute change except as noted in the HPI.  Physical Exam Updated Vital Signs BP (!) 137/99   Pulse 84   Temp 98.1 F (36.7 C) (Oral)   Resp 16   Ht 6\' 1"  (1.854 m)   Wt 84.4 kg   SpO2 98%   BMI 24.54 kg/m   Physical Exam  Constitutional: He is oriented to person, place, and time. Vital signs are normal. He appears well-developed and well-nourished.  HENT:  Head: Normocephalic and atraumatic.  Right Ear: Hearing normal.  Left Ear: Hearing normal.  Eyes: Conjunctivae and EOM are normal. Pupils are equal, round, and reactive to light.  Neck: Normal range of motion. Neck supple.  Cardiovascular: Normal rate, regular rhythm and normal heart sounds.   No murmur heard. Pulmonary/Chest: Effort normal and breath sounds normal.  Abdominal: Soft. Normal appearance and bowel sounds are normal. There is tenderness in the epigastric area.  Abdomen Soft. Mild tenderness   Musculoskeletal: Normal range of motion.  Neurological: He is alert and oriented to person, place, and time.  Skin: Skin is  warm and dry.  Psychiatric: He has a normal mood and affect. His speech is normal and behavior is normal. Thought content normal.  Nursing note and vitals reviewed.  ED Treatments / Results  Labs (all labs ordered are listed, but only abnormal results are displayed) Labs Reviewed  LIPASE, BLOOD - Abnormal; Notable for the following:       Result Value   Lipase 109 (*)    All other components within normal limits  COMPREHENSIVE METABOLIC PANEL - Abnormal; Notable for the following:    Glucose, Bld 122 (*)    Creatinine, Ser 1.27 (*)    Total Bilirubin 1.6 (*)    All other components within normal limits  URINALYSIS, ROUTINE W REFLEX MICROSCOPIC - Abnormal; Notable for  the following:    Hgb urine dipstick SMALL (*)    Ketones, ur 5 (*)    Squamous Epithelial / LPF 0-5 (*)    All other components within normal limits  CBC    EKG  EKG Interpretation None       Radiology No results found.  Procedures Procedures (including critical care time)  Medications Ordered in ED Medications - No data to display   Initial Impression / Assessment and Plan / ED Course  I have reviewed the triage vital signs and the nursing notes.  Pertinent labs & imaging results that were available during my care of the patient were reviewed by me and considered in my medical decision making (see chart for details).  Final Clinical Impressions(s) / ED Diagnoses  {I have reviewed and evaluated the relevant laboratory values. {I have reviewed and evaluated the relevant imaging studies.  {I have reviewed the relevant previous healthcare records.  {I obtained HPI from historian. {Patient discussed with supervising physician.  ED Course:  Assessment: Pt is a 46 y.o. male with hx HTN, Pancreatitis who presents with epigastric pain with onset today. Notes mild nausea. No emesis. Able to tolerate PO. No fevers. No CP/SOB. Hx same last month with admission. CT at the time without necrosis or pseudocyst. Pt has stopped ETOH intake. On exam, pt in NAD. Nontoxic/nonseptic appearing. VSS. Afebrile. Lungs CTA. Heart RRR. Abdomen with mild tenderness. CBC unremarkable. CMP unremarkable. Lipase 109. Given analgesia and PO challenged in ED without discomfort. Likely mild pancreatitis. Able to tolerate PO. Counseled on clear liquids. Plan is to DC home with follow up to PCP. I have reviewed the [place] Controlled Substance Reporting System. Rx #10 Percocet 5s. At time of discharge, Patient is in no acute distress. Vital Signs are stable. Patient is able to ambulate. Patient able to tolerate PO.   Disposition/Plan:  DC Home Additional Verbal discharge instructions given and discussed  with patient.  Pt Instructed to f/u with PCP in the next week for evaluation and treatment of symptoms. Return precautions given Pt acknowledges and agrees with plan  Supervising Physician Orpah Greek, MD  Final diagnoses:  Acute pancreatitis, unspecified complication status, unspecified pancreatitis type    New Prescriptions New Prescriptions   No medications on file       Shary Decamp, PA-C 08/15/16 [place], MD 08/15/16 [phone]

## 2016-08-15 NOTE — Discharge Instructions (Signed)
Please read and follow all provided instructions.  Your diagnoses today include:  1. Acute pancreatitis, unspecified complication status, unspecified pancreatitis type     Tests performed today include: Vital signs. See below for your results today.   Medications prescribed:  Take as prescribed   Home care instructions:  Follow any educational materials contained in this packet.  Follow-up instructions: Please follow-up with your primary care provider for further evaluation of symptoms and treatment   Return instructions:  Please return to the Emergency Department if you do not get better, if you get worse, or new symptoms OR  - Fever (temperature greater than 101.104F)  - Bleeding that does not stop with holding pressure to the area    -Severe pain (please note that you may be more sore the day after your accident)  - Chest Pain  - Difficulty breathing  - Severe nausea or vomiting  - Inability to tolerate food and liquids  - Passing out  - Skin becoming red around your wounds  - Change in mental status (confusion or lethargy)  - New numbness or weakness    Please return if you have any other emergent concerns.  Additional Information:  Your vital signs today were: BP (!) 137/99    Pulse 84    Temp 98.1 F (36.7 C) (Oral)    Resp 16    Ht 6\' 1"  (1.854 m)    Wt 84.4 kg    SpO2 98%    BMI 24.54 kg/m  If your blood pressure (BP) was elevated above 135/85 this visit, please have this repeated by your doctor within one month. --------------

## 2016-08-15 NOTE — ED Notes (Signed)
Pt given ginger ale.

## 2016-08-25 ENCOUNTER — Inpatient Hospital Stay (HOSPITAL_COMMUNITY)
Admission: EM | Admit: 2016-08-25 | Discharge: 2016-08-30 | DRG: 439 | Disposition: A | Discharge status: Home / Self Care | Payer: Self-pay | Attending: Family Medicine | Admitting: Family Medicine

## 2016-08-25 ENCOUNTER — Emergency Department (HOSPITAL_COMMUNITY): Payer: Self-pay

## 2016-08-25 ENCOUNTER — Encounter (HOSPITAL_COMMUNITY): Payer: Self-pay | Admitting: Emergency Medicine

## 2016-08-25 DIAGNOSIS — K859 Acute pancreatitis without necrosis or infection, unspecified: Secondary | ICD-10-CM

## 2016-08-25 DIAGNOSIS — Z833 Family history of diabetes mellitus: Secondary | ICD-10-CM

## 2016-08-25 DIAGNOSIS — R112 Nausea with vomiting, unspecified: Secondary | ICD-10-CM

## 2016-08-25 DIAGNOSIS — Z800 Family history of malignant neoplasm of digestive organs: Secondary | ICD-10-CM

## 2016-08-25 DIAGNOSIS — E7800 Pure hypercholesterolemia, unspecified: Secondary | ICD-10-CM | POA: Diagnosis present

## 2016-08-25 DIAGNOSIS — Z87442 Personal history of urinary calculi: Secondary | ICD-10-CM

## 2016-08-25 DIAGNOSIS — Z8249 Family history of ischemic heart disease and other diseases of the circulatory system: Secondary | ICD-10-CM

## 2016-08-25 DIAGNOSIS — K852 Alcohol induced acute pancreatitis without necrosis or infection: Principal | ICD-10-CM | POA: Diagnosis present

## 2016-08-25 DIAGNOSIS — K219 Gastro-esophageal reflux disease without esophagitis: Secondary | ICD-10-CM | POA: Diagnosis present

## 2016-08-25 DIAGNOSIS — E876 Hypokalemia: Secondary | ICD-10-CM

## 2016-08-25 DIAGNOSIS — I10 Essential (primary) hypertension: Secondary | ICD-10-CM | POA: Diagnosis present

## 2016-08-25 DIAGNOSIS — K861 Other chronic pancreatitis: Secondary | ICD-10-CM | POA: Diagnosis present

## 2016-08-25 DIAGNOSIS — Q600 Renal agenesis, unilateral: Secondary | ICD-10-CM

## 2016-08-25 LAB — URINALYSIS, ROUTINE W REFLEX MICROSCOPIC
BILIRUBIN URINE: NEGATIVE
Bacteria, UA: NONE SEEN
GLUCOSE, UA: NEGATIVE mg/dL
Hgb urine dipstick: NEGATIVE
KETONES UR: 80 mg/dL — AB
LEUKOCYTES UA: NEGATIVE
Nitrite: NEGATIVE
Protein, ur: 30 mg/dL — AB
Specific Gravity, Urine: 1.031 — ABNORMAL HIGH (ref 1.005–1.030)
pH: 5 (ref 5.0–8.0)

## 2016-08-25 LAB — LIPID PANEL
CHOL/HDL RATIO: 2.8 ratio
Cholesterol: 160 mg/dL (ref 0–200)
HDL: 58 mg/dL (ref >40)
LDL CALC: 82 mg/dL (ref 0–99)
Triglycerides: 99 mg/dL (ref <150)
VLDL: 20 mg/dL (ref 0–40)

## 2016-08-25 LAB — COMPREHENSIVE METABOLIC PANEL
ALBUMIN: 4.3 g/dL (ref 3.5–5.0)
ALK PHOS: 50 U/L (ref 38–126)
ALT: 24 U/L (ref 17–63)
ANION GAP: 10 (ref 5–15)
AST: 24 U/L (ref 15–41)
BUN: 9 mg/dL (ref 6–20)
CO2: 24 mmol/L (ref 22–32)
Calcium: 9.2 mg/dL (ref 8.9–10.3)
Chloride: 104 mmol/L (ref 101–111)
Creatinine, Ser: 1.27 mg/dL — ABNORMAL HIGH (ref 0.61–1.24)
GFR calc non Af Amer: >60 mL/min (ref >60)
Glucose, Bld: 96 mg/dL (ref 65–99)
Potassium: 3.3 mmol/L — ABNORMAL LOW (ref 3.5–5.1)
SODIUM: 138 mmol/L (ref 135–145)
TOTAL PROTEIN: 7.3 g/dL (ref 6.5–8.1)
Total Bilirubin: 1.2 mg/dL (ref 0.3–1.2)

## 2016-08-25 LAB — LIPASE, BLOOD: Lipase: 3718 U/L — ABNORMAL HIGH (ref 11–51)

## 2016-08-25 LAB — CBC WITH DIFFERENTIAL/PLATELET
BASOS ABS: 0 10*3/uL (ref 0.0–0.1)
BASOS PCT: 0 %
EOS ABS: 0 10*3/uL (ref 0.0–0.7)
Eosinophils Relative: 0 %
HCT: 42.4 % (ref 39.0–52.0)
Hemoglobin: 14.1 g/dL (ref 13.0–17.0)
LYMPHS PCT: 21 %
Lymphs Abs: 2.5 10*3/uL (ref 0.7–4.0)
MCH: 31.5 pg (ref 26.0–34.0)
MCHC: 33.3 g/dL (ref 30.0–36.0)
MCV: 94.6 fL (ref 78.0–100.0)
Monocytes Absolute: 0.5 10*3/uL (ref 0.1–1.0)
Monocytes Relative: 5 %
Neutro Abs: 9 10*3/uL — ABNORMAL HIGH (ref 1.7–7.7)
Neutrophils Relative %: 74 %
Platelets: 175 10*3/uL (ref 150–400)
RBC: 4.48 MIL/uL (ref 4.22–5.81)
RDW: 13.6 % (ref 11.5–15.5)
WBC: 12.1 10*3/uL — ABNORMAL HIGH (ref 4.0–10.5)

## 2016-08-25 LAB — ETHANOL

## 2016-08-25 MED ORDER — SODIUM CHLORIDE 0.9 % IV SOLN: INTRAVENOUS | Status: DC

## 2016-08-25 MED ORDER — MORPHINE SULFATE (PF) 4 MG/ML IV SOLN
4.0000 mg | Freq: Once | INTRAVENOUS | Status: AC
  Administered 2016-08-25: 4 mg via INTRAVENOUS
  Filled 2016-08-25: qty 1

## 2016-08-25 MED ORDER — SODIUM CHLORIDE 0.9 % IV BOLUS (SEPSIS)
1000.0000 mL | Freq: Once | INTRAVENOUS | Status: AC
Start: 2016-08-25 — End: 2016-08-25
  Administered 2016-08-25: 1000 mL via INTRAVENOUS

## 2016-08-25 MED ORDER — HYDROMORPHONE HCL 1 MG/ML IJ SOLN
1.0000 mg | INTRAMUSCULAR | Status: DC | PRN
  Administered 2016-08-25 – 2016-08-28 (×16): 1 mg via INTRAVENOUS
  Filled 2016-08-25 (×16): qty 1

## 2016-08-25 MED ORDER — ONDANSETRON HCL 4 MG/2ML IJ SOLN
4.0000 mg | Freq: Once | INTRAMUSCULAR | Status: AC
  Administered 2016-08-25: 4 mg via INTRAVENOUS
  Filled 2016-08-25: qty 2

## 2016-08-25 MED ORDER — FAMOTIDINE IN NACL 20-0.9 MG/50ML-% IV SOLN
20.0000 mg | Freq: Once | INTRAVENOUS | Status: AC
  Administered 2016-08-25: 20 mg via INTRAVENOUS
  Filled 2016-08-25: qty 50

## 2016-08-25 MED ORDER — SODIUM CHLORIDE 0.9 % IV SOLN
INTRAVENOUS | Status: DC
  Administered 2016-08-25 – 2016-08-28 (×7): via INTRAVENOUS

## 2016-08-25 MED ORDER — ENOXAPARIN SODIUM 40 MG/0.4ML ~~LOC~~ SOLN
40.0000 mg | SUBCUTANEOUS | Status: DC
  Administered 2016-08-25 – 2016-08-29 (×5): 40 mg via SUBCUTANEOUS
  Filled 2016-08-25 (×6): qty 0.4

## 2016-08-25 MED ORDER — SODIUM CHLORIDE 0.9 % IV SOLN
INTRAVENOUS | Status: DC
  Filled 2016-08-25: qty 1000

## 2016-08-25 MED ORDER — POTASSIUM CHLORIDE 10 MEQ/100ML IV SOLN
10.0000 meq | INTRAVENOUS | Status: AC
  Administered 2016-08-25 (×3): 10 meq via INTRAVENOUS
  Filled 2016-08-25 (×3): qty 100

## 2016-08-25 MED ORDER — ONDANSETRON HCL 4 MG/2ML IJ SOLN
4.0000 mg | Freq: Three times a day (TID) | INTRAMUSCULAR | Status: DC | PRN
  Administered 2016-08-27 – 2016-08-28 (×4): 4 mg via INTRAVENOUS
  Filled 2016-08-25 (×4): qty 2

## 2016-08-25 MED ORDER — IOPAMIDOL (ISOVUE-300) INJECTION 61%
INTRAVENOUS | Status: AC
  Administered 2016-08-25: 100 mL
  Filled 2016-08-25: qty 100

## 2016-08-25 MED ORDER — DEXTROSE-NACL 5-0.45 % IV SOLN: INTRAVENOUS | Status: DC

## 2016-08-25 MED ORDER — FAMOTIDINE IN NACL 20-0.9 MG/50ML-% IV SOLN
20.0000 mg | Freq: Two times a day (BID) | INTRAVENOUS | Status: DC
  Administered 2016-08-25 – 2016-08-27 (×4): 20 mg via INTRAVENOUS
  Filled 2016-08-25 (×4): qty 50

## 2016-08-25 NOTE — H&P (Signed)
[facility] Admission History and Physical Service Pager: [phone]  Patient name: Kyle Berry Medical record number: [id number] Date of birth: Jun 20, 1970 Age: 46 y.o. Gender: male  Primary Care Provider: Medicine, [facility] Consultants: none Code Status: full (obtained on admission)  Chief Complaint: Abdominal pain  Assessment and Plan: Kyle Berry is a 46 y.o. male presenting with abdominal pain . PMH is significant for chronic pancreatitis, congenital single kidney, hypertension, high cholesterol, migraines and nephrolithiasis.   Acute pancreatitis: History of chronic pancreatitis. Not sure what incited the acute flare up. Lipase elevated to 3000's. CT abdomen with acute pancreatitis without necrosis or collection, and short segment of pancreatic duct dilatation to 7 mm in the uncinate process containing a "tiny stone". Discussed about the CT finding, particularly the stone and the pancreatic duct dilation with [facility], Dr. Alessandra Bevels. He said he will evaluate the patient in the morning if he needs MRCP. The stone could could also be from calcification due to chronic pancreatitis.  -Admit to MedSurg. Attending Dr. Nori Riis -Follow up GI rec in the morning -Nothing by mouth -Status post 2 L of NS bolus in ED -IV NS at 200 mL/h for 10 hours, then D5-1/2NS at 1 25 mL/hr -Dilaudid 1 mg every 3 hours when necessary pain -Zofran 4 mg every 8 hours as needed for nausea -Pepcid 20 mg twice a day -Lipid panel -CBC, CMP and lipase in the morning -Initiatiate diet once pain is well controlled  Hypertension: normotensive. Not on any meds at home [facility] monitor  Hypokalemia: K 3.3 -IV KCl 10 mEq every hour 3 -We'll check CMP in the morning  FEN/GI:  -Nothing by mouth -IV fluid as above -Pepcid as above  Prophylaxis: Lovenox  Disposition: Admit to [facility] for management of acute pancreatitis.   History of Present Illness:   Kyle Berry is a 46 y.o. male presenting with with intermittent abdominal pain for 1 day  Patient reports intermittent abdominal pain since yesterday evening after he ate [place]. Pain has gotten worse this morning and he decided to come to ED. Pain is across his upper quadrant, more over left upper quadrant. Pain radiates to his back. He described the pain as achy. He rates his pain was 10 out of 10 when he arrived in ED. Currently pain is 6 out of 10. He says he felt "hot" yesterday but didn't check his temperature. Denies chills, emesis, cough, URI symptoms, chest pain, dyspnea or dysuria. Denies diarrhea but admits to loose stool. Last drink was about one month ago. He says he used to drink heavy in the past. Denies new medication, smoking or recreational drug use.   ED course: Vital signs within normal limits except for mildly elevated DBP to 99 on arrival to the ED. CMP with K to 3.3 otherwise normal. Lipase elevated to 3718. CBC with WBC to 12.1 otherwise normal. UA with ketone to 88 and specific gravity to 1.031. Blood alcohol negative. CT abdomen and pelvis with acute pancreatitis without necrosis or collection. Also with short segment of pancreatic duct dilatation in the uncinate process to 7 mm containing a tiny stone. He received morphine 4 mg 2, Zofran 4 mg once, Pepcid 20 mg once, bolus NS 2 L. Then family medicine nurse called to admit patient for acute pancreatitis.   Review Of Systems:   Review of Systems  Constitutional: Negative for chills, diaphoresis and fever.  HENT: Negative for sore throat.   Eyes: Negative for blurred  vision.  Respiratory: Negative for cough and shortness of breath.   Cardiovascular: Negative for chest pain and leg swelling.  Gastrointestinal: Positive for abdominal pain and nausea. Negative for blood in stool.  Genitourinary: Negative for dysuria, frequency and urgency.  Neurological: Negative for headaches.   Patient Active Problem List   Diagnosis  Date Noted  . Alcohol abuse   . Essential hypertension   . Esophageal reflux   . Acute pancreatitis 11/23/2015    Past Medical History: Past Medical History:  Diagnosis Date  . Acute pancreatitis 11/23/2015  . Chronic bronchitis ([place])   . Chronic lower back pain   . Congenital single kidney   . GERD (gastroesophageal reflux disease)   . High cholesterol   . Hypertension   . Kidney calculi   . Migraines    "once/month" (11/23/2015)  . Nephrolithiasis   . Pneumonia ~ 09/2015  . Renal disorder     Past Surgical History: Past Surgical History:  Procedure Laterality Date  . NO PAST SURGERIES      Social History: Social History  Substance Use Topics  . Smoking status: Never Smoker  . Smokeless tobacco: Never Used  . Alcohol use 3.6 oz/week    6 Cans of beer per week   Additional social history: none  Please also refer to relevant sections of EMR.  Family History: Family History  Problem Relation Age of Onset  . Hypertension Other    (If not completed, MUST add something in)  Allergies and Medications: Allergies  Allergen Reactions  . Diclofenac Hives  . Norvasc [Amlodipine Besylate] Other (See Comments)    Unknown  . Omeprazole Other (See Comments)    Unknown  . Prednisone Other (See Comments)    Mood swings   No current facility-administered medications on file prior to encounter.    Current Outpatient Prescriptions on File Prior to Encounter  Medication Sig Dispense Refill  . omeprazole (PRILOSEC) 20 MG capsule Take 20 mg by mouth daily as needed (for heart burn).     Marland Kitchen oxyCODONE-acetaminophen (PERCOCET/ROXICET) 5-325 MG tablet Take 1 tablet by mouth every 6 (six) hours as needed for severe pain. 10 tablet 0  . acetaminophen (TYLENOL) 500 MG tablet Take 1,000 mg by mouth every 6 (six) hours as needed for headache.    . ondansetron (ZOFRAN ODT) 4 MG disintegrating tablet Take 1 tablet (4 mg total) by mouth every 8 (eight) hours as needed for nausea or  vomiting. 20 tablet 0  . [DISCONTINUED] Ibuprofen-Diphenhydramine HCl (ADVIL PM) 200-25 MG CAPS Take 1 tablet by mouth at bedtime as needed (sleep).      Objective: BP 131/87   Pulse 61   Temp 98 F (36.7 C) (Oral)   Resp 18   Ht 6\' 1"  (1.854 m)   Wt 189 lb (85.7 kg)   SpO2 97%   BMI 24.94 kg/m  Exam: GEN: appears well, no apparent distress. He was up by bedside urinating.  Head: normocephalic and atraumatic  Eyes: conjunctiva without injection, sclera anicteric CVS: RRR, nl s1 & s2, no murmurs, no edema RESP: speaks in full sentence, no IWOB, good air movement bilaterally, CTAB GI: BS present & normal, soft, tender to palpation across his upper abdomen mainly over LUQ MSK: no focal tenderness or notable swelling SKIN: no apparent skin lesion NEURO: alert and oiented appropriately, no gross defecits  PSYCH: euthymic mood with congruent affect Labs and Imaging: CBC BMET   Recent Labs Lab 08/25/16 1105  WBC 12.1*  HGB  14.1  HCT 42.4  PLT 175    Recent Labs Lab 08/25/16 1105  NA 138  K 3.3*  CL 104  CO2 24  BUN 9  CREATININE 1.27*  GLUCOSE 96  CALCIUM 9.2     Ct Abdomen Pelvis W Contrast  Result Date: 08/25/2016 CLINICAL DATA:  Epigastric pain elevated lipase EXAM: CT ABDOMEN AND PELVIS WITH CONTRAST TECHNIQUE: Multidetector CT imaging of the abdomen and pelvis was performed using the standard protocol following bolus administration of intravenous contrast. CONTRAST:  126mL ISOVUE-300 IOPAMIDOL (ISOVUE-300) INJECTION 61% COMPARISON:  06/24/2016 FINDINGS: Lower chest:  Atelectasis at the bases. Hepatobiliary: Tiny low-density in the right liver is stable and too small for densitometry.No evidence of biliary obstruction or stone. Pancreas: Peripancreatic edema consistent with acute pancreatitis. Uncertain ductal anatomy. A duct in the uncinate process is focally dilated to 7 mm diameter and contains a small stone, new from 2017 Spleen: Unremarkable. Adrenals/Urinary  Tract: Negative adrenals. Tiny, essentially nonenhancing right kidney with superimposed cysts. Normal appearance of the left kidney. Cannot evaluate for renal calculi given excretion of urinary contrast. Unremarkable bladder. Stomach/Bowel:  No obstruction. No appendicitis. Vascular/Lymphatic: No acute vascular abnormality. No mass or adenopathy. Reproductive:Negative Other: No ascites or pneumoperitoneum. Musculoskeletal: No acute abnormalities. Degenerative appearing endplate irregularity at L5-S1 IMPRESSION: 1. Acute pancreatitis without necrosis or collection. Short segment of pancreatic duct dilatation in the uncinate process containing a tiny stone. 2. Small non functioning right kidney. Electronically Signed   By: Monte Fantasia M.D.   On: 08/25/2016 15:57    Mercy Riding, MD 08/25/2016, 5:10 PM PGY-2, [place] Intern pager: [phone], text pages welcome

## 2016-08-25 NOTE — ED Triage Notes (Signed)
Pt. Stated, I was here 2 weeks ago with the same problem, pancreatitis, and it never went away . I ate some pizza last night , and its really hurting bad. (stomach to left)

## 2016-08-25 NOTE — ED Notes (Signed)
Patient transported to CT 

## 2016-08-25 NOTE — ED Provider Notes (Signed)
[facility] DEPT Provider Note   CSN: [id number] Arrival date & time: 08/25/16  9485     History   Chief Complaint Chief Complaint  Patient presents with  . Pancreatitis  . Abdominal Pain    HPI Kyle Berry is a 46 y.o. male.  Kyle Berry is a 46 y.o. Male with a history of pancreatitis presents to the emergency department complaining of upper abdominal pain with associated nausea, vomiting and diarrhea today. He reports this feels like his pancreatitis flare. He reports he was seen in the emergency department about 10 days ago and his pain improved but then worsened again last night. He reports one episode of vomiting and one episode of diarrhea today. He denies drinking alcohol. No fevers. He was admitted on March 2018 for pancreatitis. CT scans of the time were negative for pseudocyst or necrosis. No treatments attempted prior to arrival today. He does report some burping and belching today. He denies fevers, hematemesis, hematochezia, urinary symptoms, chest pain, shortness of breath, previous abdominal surgeries.   The history is provided by the patient and medical records. No language interpreter was used.  Abdominal Pain   Associated symptoms include diarrhea, nausea and vomiting. Pertinent negatives include fever, constipation, dysuria, frequency, hematuria and headaches.    Past Medical History:  Diagnosis Date  . Acute pancreatitis 11/23/2015  . Chronic bronchitis ([place])   . Chronic lower back pain   . Congenital single kidney   . GERD (gastroesophageal reflux disease)   . High cholesterol   . Hypertension   . Kidney calculi   . Migraines    "once/month" (11/23/2015)  . Nephrolithiasis   . Pneumonia ~ 09/2015  . Renal disorder     Patient Active Problem List   Diagnosis Date Noted  . Alcohol abuse   . Essential hypertension   . Esophageal reflux   . Acute pancreatitis 11/23/2015    Past Surgical History:  Procedure Laterality Date  . NO  PAST SURGERIES         Home Medications    Prior to Admission medications   Medication Sig Start Date End Date Taking? Authorizing Provider  ibuprofen (ADVIL,MOTRIN) 200 MG tablet Take 400 mg by mouth every 6 (six) hours as needed for headache or moderate pain.   Yes [provider]  omeprazole (PRILOSEC) 20 MG capsule Take 20 mg by mouth daily as needed (for heart burn).    Yes [provider]  oxyCODONE-acetaminophen (PERCOCET/ROXICET) 5-325 MG tablet Take 1 tablet by mouth every 6 (six) hours as needed for severe pain. 08/15/16  Yes Shary Decamp, PA-C  acetaminophen (TYLENOL) 500 MG tablet Take 1,000 mg by mouth every 6 (six) hours as needed for headache.    [provider]  ondansetron (ZOFRAN ODT) 4 MG disintegrating tablet Take 1 tablet (4 mg total) by mouth every 8 (eight) hours as needed for nausea or vomiting. 08/15/16   Shary Decamp, PA-C    Family History Family History  Problem Relation Age of Onset  . Hypertension Other     Social History Social History  Substance Use Topics  . Smoking status: Never Smoker  . Smokeless tobacco: Never Used  . Alcohol use 3.6 oz/week    6 Cans of beer per week     Allergies   Diclofenac; Norvasc [amlodipine besylate]; Omeprazole; and Prednisone   Review of Systems Review of Systems  Constitutional: Negative for chills and fever.  HENT: Negative for sore throat.   Eyes: Negative for  visual disturbance.  Respiratory: Negative for cough and shortness of breath.   Cardiovascular: Negative for chest pain.  Gastrointestinal: Positive for abdominal pain, diarrhea, nausea and vomiting. Negative for blood in stool and constipation.  Genitourinary: Negative for difficulty urinating, dysuria, frequency, hematuria and urgency.  Musculoskeletal: Negative for back pain and neck pain.  Skin: Negative for rash.  Neurological: Negative for headaches.     Physical Exam Updated Vital Signs BP 138/89   Pulse 60    Temp 98 F (36.7 C) (Oral)   Resp 18   Ht 6\' 1"  (1.854 m)   Wt 85.7 kg   SpO2 97%   BMI 24.94 kg/m   Physical Exam  Constitutional: He appears well-developed and well-nourished. No distress.  Nontoxic appearing.  HENT:  Head: Normocephalic and atraumatic.  Mouth/Throat: Oropharynx is clear and moist.  Mucous membranes are moist.  Eyes: Conjunctivae are normal. Pupils are equal, round, and reactive to light. Right eye exhibits no discharge. Left eye exhibits no discharge.  Neck: Neck supple.  Cardiovascular: Normal rate, regular rhythm, normal heart sounds and intact distal pulses.  Exam reveals no gallop and no friction rub.   No murmur heard. Pulmonary/Chest: Effort normal and breath sounds normal. No respiratory distress. He has no wheezes. He has no rales.  Abdominal: Soft. Bowel sounds are normal. He exhibits no distension and no mass. There is tenderness. There is no rebound and no guarding.  Abdomen soft. Bowel sounds are present. Patient has epigastric abdominal tenderness to palpation. No CVA or flank tenderness. No peritoneal signs.  Musculoskeletal: He exhibits no edema.  Lymphadenopathy:    He has no cervical adenopathy.  Neurological: He is alert. Coordination normal.  Skin: Skin is warm and dry. No rash noted. He is not diaphoretic. No erythema. No pallor.  Psychiatric: He has a normal mood and affect. His behavior is normal.  Nursing note and vitals reviewed.    ED Treatments / Results  Labs (all labs ordered are listed, but only abnormal results are displayed) Labs Reviewed  COMPREHENSIVE METABOLIC PANEL - Abnormal; Notable for the following:       Result Value   Potassium 3.3 (*)    Creatinine, Ser 1.27 (*)    All other components within normal limits  LIPASE, BLOOD - Abnormal; Notable for the following:    Lipase 3,718 (*)    All other components within normal limits  CBC WITH DIFFERENTIAL/PLATELET - Abnormal; Notable for the following:    WBC 12.1 (*)      Neutro Abs 9.0 (*)    All other components within normal limits  URINALYSIS, ROUTINE W REFLEX MICROSCOPIC - Abnormal; Notable for the following:    Color, Urine AMBER (*)    Specific Gravity, Urine 1.031 (*)    Ketones, ur 80 (*)    Protein, ur 30 (*)    Squamous Epithelial / LPF 0-5 (*)    All other components within normal limits  ETHANOL    EKG  EKG Interpretation None       Radiology Ct Abdomen Pelvis W Contrast  Result Date: 08/25/2016 CLINICAL DATA:  Epigastric pain elevated lipase EXAM: CT ABDOMEN AND PELVIS WITH CONTRAST TECHNIQUE: Multidetector CT imaging of the abdomen and pelvis was performed using the standard protocol following bolus administration of intravenous contrast. CONTRAST:  138mL ISOVUE-300 IOPAMIDOL (ISOVUE-300) INJECTION 61% COMPARISON:  06/24/2016 FINDINGS: Lower chest:  Atelectasis at the bases. Hepatobiliary: Tiny low-density in the right liver is stable and too small for densitometry.No evidence  of biliary obstruction or stone. Pancreas: Peripancreatic edema consistent with acute pancreatitis. Uncertain ductal anatomy. A duct in the uncinate process is focally dilated to 7 mm diameter and contains a small stone, new from 2017 Spleen: Unremarkable. Adrenals/Urinary Tract: Negative adrenals. Tiny, essentially nonenhancing right kidney with superimposed cysts. Normal appearance of the left kidney. Cannot evaluate for renal calculi given excretion of urinary contrast. Unremarkable bladder. Stomach/Bowel:  No obstruction. No appendicitis. Vascular/Lymphatic: No acute vascular abnormality. No mass or adenopathy. Reproductive:Negative Other: No ascites or pneumoperitoneum. Musculoskeletal: No acute abnormalities. Degenerative appearing endplate irregularity at L5-S1 IMPRESSION: 1. Acute pancreatitis without necrosis or collection. Short segment of pancreatic duct dilatation in the uncinate process containing a tiny stone. 2. Small non functioning right kidney.  Electronically Signed   By: Monte Fantasia M.D.   On: 08/25/2016 15:57    Procedures Procedures (including critical care time)  Medications Ordered in ED Medications  sodium chloride 0.9 % bolus 1,000 mL (0 mLs Intravenous Stopped 08/25/16 1304)  ondansetron (ZOFRAN) injection 4 mg (4 mg Intravenous Given 08/25/16 1121)  morphine 4 MG/ML injection 4 mg (4 mg Intravenous Given 08/25/16 1121)  famotidine (PEPCID) IVPB 20 mg premix (0 mg Intravenous Stopped 08/25/16 1151)  sodium chloride 0.9 % bolus 1,000 mL (1,000 mLs Intravenous New Bag/Given 08/25/16 1341)  morphine 4 MG/ML injection 4 mg (4 mg Intravenous Given 08/25/16 1342)  iopamidol (ISOVUE-300) 61 % injection (100 mLs  Contrast Given 08/25/16 1455)     Initial Impression / Assessment and Plan / ED Course  I have reviewed the triage vital signs and the nursing notes.  Pertinent labs & imaging results that were available during my care of the patient were reviewed by me and considered in my medical decision making (see chart for details).    This  is a 46 y.o. Male with a history of pancreatitis presents to the emergency department complaining of upper abdominal pain with associated nausea, vomiting and diarrhea today. He reports this feels like his pancreatitis flare. He reports he was seen in the emergency department about 10 days ago and his pain improved but then worsened again last night. He reports one episode of vomiting and one episode of diarrhea today. He denies drinking alcohol. No fevers. He was admitted on March 2018 for pancreatitis. CT scans of the time were negative for pseudocyst or necrosis. No treatments attempted prior to arrival today. He denies alcohol use in the past 2 weeks. On exam patient is afebrile nontoxic appearing. He has epigastric abdominal tenderness to palpation. Lipase is elevated at 3700. He has normal liver enzymes. CT of his abdomen and pelvis shows acute pancreatitis without necrosis or  collection. After evaluation patient reports he's feeling better after fluids and pain medicine. Plan for admission. Patient agrees with plan for admission.  I consulted with internal medicine teaching service who accepted the patient for admission.  This patient was discussed with Dr. Oleta Mouse who agrees with assessment and plan.   Final Clinical Impressions(s) / ED Diagnoses   Final diagnoses:  Alcohol-induced acute pancreatitis without infection or necrosis  Non-intractable vomiting with nausea, unspecified vomiting type    New Prescriptions New Prescriptions   No medications on file     Waynetta Pean, Hershal Coria 08/25/16 1625    Forde Dandy, MD 08/26/16 1739

## 2016-08-26 ENCOUNTER — Inpatient Hospital Stay (HOSPITAL_COMMUNITY): Payer: Self-pay

## 2016-08-26 DIAGNOSIS — E876 Hypokalemia: Secondary | ICD-10-CM

## 2016-08-26 DIAGNOSIS — R112 Nausea with vomiting, unspecified: Secondary | ICD-10-CM

## 2016-08-26 LAB — COMPREHENSIVE METABOLIC PANEL WITH GFR
ALT: 18 U/L (ref 17–63)
AST: 19 U/L (ref 15–41)
Albumin: 3.3 g/dL — ABNORMAL LOW (ref 3.5–5.0)
Alkaline Phosphatase: 41 U/L (ref 38–126)
Anion gap: 8 (ref 5–15)
BUN: 6 mg/dL (ref 6–20)
CO2: 23 mmol/L (ref 22–32)
Calcium: 7.9 mg/dL — ABNORMAL LOW (ref 8.9–10.3)
Chloride: 107 mmol/L (ref 101–111)
Creatinine, Ser: 1.23 mg/dL (ref 0.61–1.24)
GFR calc Af Amer: >60 mL/min (ref >60)
GFR calc non Af Amer: >60 mL/min (ref >60)
Glucose, Bld: 67 mg/dL (ref 65–99)
Potassium: 3.6 mmol/L (ref 3.5–5.1)
Sodium: 138 mmol/L (ref 135–145)
Total Bilirubin: 1.1 mg/dL (ref 0.3–1.2)
Total Protein: 5.9 g/dL — ABNORMAL LOW (ref 6.5–8.1)

## 2016-08-26 LAB — CBC
HCT: 38.7 % — ABNORMAL LOW (ref 39.0–52.0)
Hemoglobin: 12.8 g/dL — ABNORMAL LOW (ref 13.0–17.0)
MCH: 32 pg (ref 26.0–34.0)
MCHC: 33.1 g/dL (ref 30.0–36.0)
MCV: 96.8 fL (ref 78.0–100.0)
PLATELETS: 166 10*3/uL (ref 150–400)
RBC: 4 MIL/uL — ABNORMAL LOW (ref 4.22–5.81)
RDW: 14 % (ref 11.5–15.5)
WBC: 7.2 10*3/uL (ref 4.0–10.5)

## 2016-08-26 LAB — LIPASE, BLOOD: Lipase: 551 U/L — ABNORMAL HIGH (ref 11–51)

## 2016-08-26 MED ORDER — GADOBENATE DIMEGLUMINE 529 MG/ML IV SOLN
14.0000 mL | Freq: Once | INTRAVENOUS | Status: AC | PRN
  Administered 2016-08-26: 14 mL via INTRAVENOUS

## 2016-08-26 NOTE — Consult Note (Signed)
Referring Provider:  [facility] Primary Care Physician:  [facility], [facility] Gastroenterologist:   Unassigned  Reason for Consultation:  Pancreatitis, dilated PD  HPI: Kyle Berry is a 46 y.o. male came into the hospital for further evaluation of abdominal pain. Patient first started noticing epigastric pain with mainly cimetidine March. 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 Patient stated that he continued to have on and off epigastric pain since March 2018. Patient came in to the ER on 08/15/2016 with worsening epigastric pain. He was diagnosed with mild pancreatitis and was subsequently discharged but he continued to have worsening epigastric pain and episode of nausea and vomiting on Friday and decided to come to the hospital again for further management. Patient denied any weight loss. Epigastric pain is radiating to the back. Pain is worse with fatty food. 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 No alcohol use since last 20 days. Denied any fever or chills. No bowel movement since yesterday. Denied any blood in the stool or black stool.  No previous endoscopic workup. Family history positive for pancreatic cancer in grandmother in her 28s. No family history of colon cancer  Past Medical History:  Diagnosis Date  . Acute pancreatitis 11/23/2015  . Chronic bronchitis (Early)   . Chronic lower back pain   . Congenital single kidney   . GERD (gastroesophageal reflux disease)   . High cholesterol   . Hypertension   . Kidney calculi   . Migraines    "once/month" (11/23/2015)  . Nephrolithiasis   . Pneumonia ~ 09/2015  . Renal disorder     Past Surgical History:  Procedure Laterality Date  . NO PAST SURGERIES      Prior to Admission medications   Medication Sig Start Date End Date Taking? Authorizing Provider  ibuprofen (ADVIL,MOTRIN) 200 MG tablet Take 400 mg by mouth  every 6 (six) hours as needed for headache or moderate pain.   Yes [provider]  omeprazole (PRILOSEC) 20 MG capsule Take 20 mg by mouth daily as needed (for heart burn).    Yes [provider]  oxyCODONE-acetaminophen (PERCOCET/ROXICET) 5-325 MG tablet Take 1 tablet by mouth every 6 (six) hours as needed for severe pain. 08/15/16  Yes Shary Decamp, PA-C  acetaminophen (TYLENOL) 500 MG tablet Take 1,000 mg by mouth every 6 (six) hours as needed for headache.    [provider]  ondansetron (ZOFRAN ODT) 4 MG disintegrating tablet Take 1 tablet (4 mg total) by mouth every 8 (eight) hours as needed for nausea or vomiting. 08/15/16   Shary Decamp, PA-C    Scheduled Meds: . enoxaparin (LOVENOX) injection  40 mg Subcutaneous Q24H   Continuous Infusions: . sodium chloride 200 mL/hr at 08/26/16 1005  . famotidine (PEPCID) IV Stopped (08/26/16 1040)   PRN Meds:.HYDROmorphone (DILAUDID) injection, ondansetron (ZOFRAN) IV  Allergies as of 08/25/2016 - Review Complete 08/25/2016  Allergen Reaction Noted  . Diclofenac Hives 12/14/2013  . Norvasc [amlodipine besylate] Other (See Comments) 08/15/2016  . Omeprazole Other (See Comments) 08/15/2016  . Prednisone Other (See Comments) 12/14/2013    Family History  Problem Relation Age of Onset  . Hypertension Other   . Hypertension Mother   . Hypertension Father   . Kidney disease Father   . Hypertension Sister   . Diabetes Sister   . Hypertension Brother     Social History   Social  History  . Marital status: Single    Spouse name: N/A  . Number of children: N/A  . Years of education: N/A   Occupational History  . Not on file.   Social History Main Topics  . Smoking status: Never Smoker  . Smokeless tobacco: Never Used  . Alcohol use 3.6 oz/week    6 Cans of beer per week     Comment: last drink a month ago  . Drug use: No  . Sexual activity: Yes    Birth control/ protection: Condom   Other Topics Concern   . Not on file   Social History Narrative  . No narrative on file    Review of Systems: Review of Systems  Constitutional: Negative for chills and fever.  HENT: Negative for ear discharge, ear pain, hearing loss and tinnitus.   Eyes: Negative for blurred vision and double vision.  Respiratory: Negative for cough, hemoptysis and sputum production.   Cardiovascular: Negative for chest pain and palpitations.  Gastrointestinal: Positive for abdominal pain, heartburn, nausea and vomiting. Negative for blood in stool and melena.  Genitourinary: Negative for dysuria and urgency.  Musculoskeletal: Positive for back pain. Negative for myalgias and neck pain.  Skin: Negative for rash.  Neurological: Negative for dizziness, speech change, focal weakness, seizures and weakness.  Endo/Heme/Allergies: Does not bruise/bleed easily.  Psychiatric/Behavioral: Negative for hallucinations and suicidal ideas.    Physical Exam: Vital signs: Vitals:   08/26/16 0601 08/26/16 1033  BP: 131/78 127/75  Pulse: 66 72  Resp: 18 18  Temp: 98.2 F (36.8 C) 98.5 F (36.9 C)   Last BM Date: 08/24/16 Physical Exam  Constitutional: He is oriented to person, place, and time and well-developed, well-nourished, and in no distress. No distress.  HENT:  Head: Normocephalic and atraumatic.  Nose: Nose normal.  Mouth/Throat: Oropharynx is clear and moist. No oropharyngeal exudate.  Eyes: EOM are normal. Left eye exhibits no discharge. No scleral icterus.  Neck: Normal range of motion. Neck supple. No thyromegaly present.  Cardiovascular: Normal rate, regular rhythm and normal heart sounds.   Pulmonary/Chest: Effort normal and breath sounds normal. No respiratory distress.  Abdominal: Soft. Bowel sounds are normal. He exhibits no distension. There is tenderness. There is no rebound and no guarding.  Mild epigastric tenderness to palpation  Musculoskeletal: Normal range of motion. He exhibits no edema.   Neurological: He is alert and oriented to person, place, and time.  Skin: Skin is warm. No erythema.  Psychiatric: Mood, affect and judgment normal.    GI:  Lab Results:  Recent Labs  08/25/16 1105 08/26/16 0439  WBC 12.1* 7.2  HGB 14.1 12.8*  HCT 42.4 38.7*  PLT 175 166   BMET  Recent Labs  08/25/16 1105 08/26/16 0439  NA 138 138  K 3.3* 3.6  CL 104 107  CO2 24 23  GLUCOSE 96 67  BUN 9 6  CREATININE 1.27* 1.23  CALCIUM 9.2 7.9*   LFT  Recent Labs  08/26/16 0439  PROT 5.9*  ALBUMIN 3.3*  AST 19  ALT 18  ALKPHOS 41  BILITOT 1.1   PT/INR No results for input(s): LABPROT, INR in the last 72 hours.   Studies/Results: Ct Abdomen Pelvis W Contrast  Result Date: 08/25/2016 CLINICAL DATA:  Epigastric pain elevated lipase EXAM: CT ABDOMEN AND PELVIS WITH CONTRAST TECHNIQUE: Multidetector CT imaging of the abdomen and pelvis was performed using the standard protocol following bolus administration of intravenous contrast. CONTRAST:  189mL ISOVUE-300 IOPAMIDOL (ISOVUE-300)  INJECTION 61% COMPARISON:  06/24/2016 FINDINGS: Lower chest:  Atelectasis at the bases. Hepatobiliary: Tiny low-density in the right liver is stable and too small for densitometry.No evidence of biliary obstruction or stone. Pancreas: Peripancreatic edema consistent with acute pancreatitis. Uncertain ductal anatomy. A duct in the uncinate process is focally dilated to 7 mm diameter and contains a small stone, new from 2017 Spleen: Unremarkable. Adrenals/Urinary Tract: Negative adrenals. Tiny, essentially nonenhancing right kidney with superimposed cysts. Normal appearance of the left kidney. Cannot evaluate for renal calculi given excretion of urinary contrast. Unremarkable bladder. Stomach/Bowel:  No obstruction. No appendicitis. Vascular/Lymphatic: No acute vascular abnormality. No mass or adenopathy. Reproductive:Negative Other: No ascites or pneumoperitoneum. Musculoskeletal: No acute abnormalities.  Degenerative appearing endplate irregularity at L5-S1 IMPRESSION: 1. Acute pancreatitis without necrosis or collection. Short segment of pancreatic duct dilatation in the uncinate process containing a tiny stone. 2. Small non functioning right kidney. Electronically Signed   By: Monte Fantasia M.D.   On: 08/25/2016 15:57    Impression/Plan: - Acute on possible chronic pancreatitis. Elevated lipase more than 3000 on admission. Patient with history of heavy alcohol use in the past but has been drinking only one beer after work since last 1 year and no alcohol use in last 20 days. - Dilated PD with possible pancreatic duct stone. Concerning for chronic pancreatitis. - Family history of pancreatic cancer in grandmother.  Recommendations ------------------------- - Patient with recurrent pancreatitis since March 2018. Etiology uncertain but alcohol use could be the cause. Triglycerides normal. No new medications. No evidence of gallstones. Given family history of pancreatic cancer and dilated PD, I will get MRI-MRCP for further evaluation. Okay to start low-fat diet after MRI-MRCP. Patient was advised for absolute alcohol abstinence. GI will follow.    LOS: 1 day   Otis Brace  MD, FACP 08/26/2016, 11:50 AM  Pager [phone] If no answer or after 5 PM call [phone]

## 2016-08-26 NOTE — Progress Notes (Signed)
Family Medicine Teaching Service Daily Progress Note Intern Pager: [phone]  Patient name: Kyle Berry Medical record number: [id number] Date of birth: [birth year] Age: 46 y.o. Gender: male  Primary Care Provider: [facility] Consultants: none Code Status: FULL   Pt Overview and Major Events to Date:  Admit 5/12  Assessment and Plan: Kyle Berry is a 46 y.o. male presenting with abdominal pain . PMH is significant for chronic pancreatitis, congenital single kidney, hypertension, high cholesterol, migraines and nephrolithiasis.   Acute pancreatitis:  History of chronic pancreatitis.  Lipase elevated to 3000s. CT abdomen with acute pancreatitis without necrosis or collection, and short segment of pancreatic duct dilatation to 7 mm in the uncinate process containing a "tiny stone".  Discussed CT finding, particularly the stone and the pancreatic duct dilation with [facility], Dr. Alessandra Bevels. He said he will evaluate the patient this morning to see if he needs MRCP. The stone could could also be from calcification due to chronic pancreatitis.  Lipid panel wnl. Lipase this AM 551.  -Follow up GI recs re: need for MRCP -NPO  -S/p 2 L of NS bolus in ED -IV NS at 200 mL/h for 10 hours, then D5-1/2NS at 1 25 mL/hr -Dilaudid 1 mg every 3 hours PRN for pain  -Zofran 4 mg every 8 hours PRN for nausea  -Pepcid 20 mg BID  -CBC, CMP and in the morning -Initiate diet once pain is well controlled  Hypertension: normotensive. Not on any meds at home -Monitor BPs   Hypokalemia: K 3.3.  Given IV KCl 10 mEq every hour 3.  This AM 3.6.  -check CMP in the morning  FEN/GI:  -NPO  -IV fluid as above -Pepcid as above  Prophylaxis: Lovenox  Disposition: Dispo pending clinical improvement.   Subjective:  Patient laying in bed. He appears comfortable. No acute events noted overnight.  He c/o some mild epigastric pain.  Denies nausea and vomiting.     Objective: Temp:  [98.1 F (36.7 C)-98.5 F (36.9 C)] 98.5 F (36.9 C) (05/13 1033) Pulse Rate:  [36-72] 72 (05/13 1033) Resp:  [18] 18 (05/13 1033) BP: (121-151)/(75-92) 127/75 (05/13 1033) SpO2:  [96 %-100 %] 98 % (05/13 1033) Weight:  [180 lb 1.9 oz (81.7 kg)] 180 lb 1.9 oz (81.7 kg) (05/12 2150) Physical Exam: GEN: 46 yo M, NAD  CVS: RRR, no MRG  RESP: CTAB, normal WOB on RA  GI: soft, TTP over epigastric region, no rebound or guarding present, +bs  MSK: no focal tenderness or notable swelling SKIN: no apparent skin lesion NEURO: alert and oiented appropriately, no gross defecits   Laboratory:  Recent Labs Lab 08/25/16 1105 08/26/16 0439  WBC 12.1* 7.2  HGB 14.1 12.8*  HCT 42.4 38.7*  PLT 175 166    Recent Labs Lab 08/25/16 1105 08/26/16 0439  NA 138 138  K 3.3* 3.6  CL 104 107  CO2 24 23  BUN 9 6  CREATININE 1.27* 1.23  CALCIUM 9.2 7.9*  PROT 7.3 5.9*  BILITOT 1.2 1.1  ALKPHOS 50 41  ALT 24 18  AST 24 19  GLUCOSE 96 67   Imaging/Diagnostic Tests: Ct Abdomen Pelvis W Contrast  Result Date: 08/25/2016 CLINICAL DATA:  Epigastric pain elevated lipase EXAM: CT ABDOMEN AND PELVIS WITH CONTRAST TECHNIQUE: Multidetector CT imaging of the abdomen and pelvis was performed using the standard protocol following bolus administration of intravenous contrast. CONTRAST:  143mL ISOVUE-300 IOPAMIDOL (ISOVUE-300) INJECTION 61% COMPARISON:  06/24/2016 FINDINGS:  Lower chest:  Atelectasis at the bases. Hepatobiliary: Tiny low-density in the right liver is stable and too small for densitometry.No evidence of biliary obstruction or stone. Pancreas: Peripancreatic edema consistent with acute pancreatitis. Uncertain ductal anatomy. A duct in the uncinate process is focally dilated to 7 mm diameter and contains a small stone, new from 2017 Spleen: Unremarkable. Adrenals/Urinary Tract: Negative adrenals. Tiny, essentially nonenhancing right kidney with superimposed cysts. Normal  appearance of the left kidney. Cannot evaluate for renal calculi given excretion of urinary contrast. Unremarkable bladder. Stomach/Bowel:  No obstruction. No appendicitis. Vascular/Lymphatic: No acute vascular abnormality. No mass or adenopathy. Reproductive:Negative Other: No ascites or pneumoperitoneum. Musculoskeletal: No acute abnormalities. Degenerative appearing endplate irregularity at L5-S1 IMPRESSION: 1. Acute pancreatitis without necrosis or collection. Short segment of pancreatic duct dilatation in the uncinate process containing a tiny stone. 2. Small non functioning right kidney. Electronically Signed   By: Monte Fantasia M.D.   On: 08/25/2016 15:57    Lovenia Kim, MD 08/26/2016, 12:17 PM PGY-1, [facility] Intern pager: [phone], text pages welcome

## 2016-08-27 DIAGNOSIS — K861 Other chronic pancreatitis: Secondary | ICD-10-CM

## 2016-08-27 LAB — BASIC METABOLIC PANEL
ANION GAP: 10 (ref 5–15)
BUN: <5 mg/dL — ABNORMAL LOW (ref 6–20)
CALCIUM: 8.8 mg/dL — AB (ref 8.9–10.3)
CO2: 25 mmol/L (ref 22–32)
CREATININE: 1.08 mg/dL (ref 0.61–1.24)
Chloride: 101 mmol/L (ref 101–111)
GFR calc Af Amer: >60 mL/min (ref >60)
Glucose, Bld: 94 mg/dL (ref 65–99)
Potassium: 3.9 mmol/L (ref 3.5–5.1)
SODIUM: 136 mmol/L (ref 135–145)

## 2016-08-27 LAB — CBC
HCT: 40.1 % (ref 39.0–52.0)
HEMOGLOBIN: 13.5 g/dL (ref 13.0–17.0)
MCH: 32.1 pg (ref 26.0–34.0)
MCHC: 33.7 g/dL (ref 30.0–36.0)
MCV: 95.5 fL (ref 78.0–100.0)
PLATELETS: 180 10*3/uL (ref 150–400)
RBC: 4.2 MIL/uL — AB (ref 4.22–5.81)
RDW: 13.7 % (ref 11.5–15.5)
WBC: 7.7 10*3/uL (ref 4.0–10.5)

## 2016-08-27 NOTE — Progress Notes (Signed)
Subjective: Patient was seen and examined at bedside. He reports improvement in nausea and has been able to tolerate low-fat diet. He complains of persistent abdominal pain, located in the epigastrium, nonradiating but controlled with IV pain medications.  Objective: Vital signs in last 24 hours: Temp:  [98.1 F (36.7 C)-98.6 F (37 C)] 98.1 F (36.7 C) (05/14 0403) Pulse Rate:  [64-76] 76 (05/14 0403) Resp:  [18] 18 (05/14 0403) BP: (125-142)/(75-82) 125/78 (05/14 0403) SpO2:  [96 %-99 %] 99 % (05/14 0403) Weight:  [81.8 kg (180 lb 6.4 oz)] 81.8 kg (180 lb 6.4 oz) (05/13 2025) Weight change: -3.901 kg (-8 lb 9.6 oz) Last BM Date: 08/25/16  PE: This comfortable, not in acute distress, no signs of dehydration GENERAL: Able to speak in full sentences, alert, awake and  oriented to time, place and person  ABDOMEN: Voluntary guarding, mild tenderness in the epigastrium, no rebound, normoactive bowel sounds EXTREMITIES: No edema, no deformity  Lab Results: Results for orders placed or performed during the hospital encounter of 08/25/16 (from the past 48 hour(s))  Comprehensive metabolic panel     Status: Abnormal   Collection Time: 08/25/16 11:05 AM  Result Value Ref Range   Sodium 138 135 - 145 mmol/L   Potassium 3.3 (L) 3.5 - 5.1 mmol/L   Chloride 104 101 - 111 mmol/L   CO2 24 22 - 32 mmol/L   Glucose, Bld 96 65 - 99 mg/dL   BUN 9 6 - 20 mg/dL   Creatinine, Ser 1.27 (H) 0.61 - 1.24 mg/dL   Calcium 9.2 8.9 - 10.3 mg/dL   Total Protein 7.3 6.5 - 8.1 g/dL   Albumin 4.3 3.5 - 5.0 g/dL   AST 24 15 - 41 U/L   ALT 24 17 - 63 U/L   Alkaline Phosphatase 50 38 - 126 U/L   Total Bilirubin 1.2 0.3 - 1.2 mg/dL   GFR calc non Af Amer >60 >60 mL/min   GFR calc Af Amer >60 >60 mL/min    Comment: (NOTE) The eGFR has been calculated using the CKD EPI equation. This calculation has not been validated in all clinical situations. eGFR's persistently <60 mL/min signify possible Chronic  Kidney Disease.    Anion gap 10 5 - 15  Lipase, blood     Status: Abnormal   Collection Time: 08/25/16 11:05 AM  Result Value Ref Range   Lipase 3,718 (H) 11 - 51 U/L    Comment: RESULTS CONFIRMED BY MANUAL DILUTION  Ethanol     Status: None   Collection Time: 08/25/16 11:05 AM  Result Value Ref Range   Alcohol, Ethyl (B) <5 <5 mg/dL    Comment:        LOWEST DETECTABLE LIMIT FOR SERUM ALCOHOL IS 5 mg/dL FOR MEDICAL PURPOSES ONLY   CBC with Differential     Status: Abnormal   Collection Time: 08/25/16 11:05 AM  Result Value Ref Range   WBC 12.1 (H) 4.0 - 10.5 K/uL   RBC 4.48 4.22 - 5.81 MIL/uL   Hemoglobin 14.1 13.0 - 17.0 g/dL   HCT 42.4 39.0 - 52.0 %   MCV 94.6 78.0 - 100.0 fL   MCH 31.5 26.0 - 34.0 pg   MCHC 33.3 30.0 - 36.0 g/dL   RDW 13.6 11.5 - 15.5 %   Platelets 175 150 - 400 K/uL   Neutrophils Relative % 74 %   Neutro Abs 9.0 (H) 1.7 - 7.7 K/uL   Lymphocytes Relative 21 %  Lymphs Abs 2.5 0.7 - 4.0 K/uL   Monocytes Relative 5 %   Monocytes Absolute 0.5 0.1 - 1.0 K/uL   Eosinophils Relative 0 %   Eosinophils Absolute 0.0 0.0 - 0.7 K/uL   Basophils Relative 0 %   Basophils Absolute 0.0 0.0 - 0.1 K/uL  Urinalysis, Routine w reflex microscopic     Status: Abnormal   Collection Time: 08/25/16 12:04 PM  Result Value Ref Range   Color, Urine AMBER (A) YELLOW    Comment: BIOCHEMICALS MAY BE AFFECTED BY COLOR   APPearance CLEAR CLEAR   Specific Gravity, Urine 1.031 (H) 1.005 - 1.030   pH 5.0 5.0 - 8.0   Glucose, UA NEGATIVE NEGATIVE mg/dL   Hgb urine dipstick NEGATIVE NEGATIVE   Bilirubin Urine NEGATIVE NEGATIVE   Ketones, ur 80 (A) NEGATIVE mg/dL   Protein, ur 30 (A) NEGATIVE mg/dL   Nitrite NEGATIVE NEGATIVE   Leukocytes, UA NEGATIVE NEGATIVE   RBC / HPF 0-5 0 - 5 RBC/hpf   WBC, UA 6-30 0 - 5 WBC/hpf   Bacteria, UA NONE SEEN NONE SEEN   Squamous Epithelial / LPF 0-5 (A) NONE SEEN   Mucous PRESENT   Lipid panel     Status: None   Collection Time: 08/25/16   7:15 PM  Result Value Ref Range   Cholesterol 160 0 - 200 mg/dL   Triglycerides 99 <150 mg/dL   HDL 58 >40 mg/dL   Total CHOL/HDL Ratio 2.8 RATIO   VLDL 20 0 - 40 mg/dL   LDL Cholesterol 82 0 - 99 mg/dL    Comment:        Total Cholesterol/HDL:CHD Risk Coronary Heart Disease Risk Table                     Men   Women  1/2 Average Risk   3.4   3.3  Average Risk       5.0   4.4  2 X Average Risk   9.6   7.1  3 X Average Risk  23.4   11.0        Use the calculated Patient Ratio above and the CHD Risk Table to determine the patient's CHD Risk.        ATP III CLASSIFICATION (LDL):  <100     mg/dL   Optimal  100-129  mg/dL   Near or Above                    Optimal  130-159  mg/dL   Borderline  160-189  mg/dL   High  >190     mg/dL   Very High   Comprehensive metabolic panel     Status: Abnormal   Collection Time: 08/26/16  4:39 AM  Result Value Ref Range   Sodium 138 135 - 145 mmol/L   Potassium 3.6 3.5 - 5.1 mmol/L   Chloride 107 101 - 111 mmol/L   CO2 23 22 - 32 mmol/L   Glucose, Bld 67 65 - 99 mg/dL   BUN 6 6 - 20 mg/dL   Creatinine, Ser 1.23 0.61 - 1.24 mg/dL   Calcium 7.9 (L) 8.9 - 10.3 mg/dL   Total Protein 5.9 (L) 6.5 - 8.1 g/dL   Albumin 3.3 (L) 3.5 - 5.0 g/dL   AST 19 15 - 41 U/L   ALT 18 17 - 63 U/L   Alkaline Phosphatase 41 38 - 126 U/L   Total Bilirubin 1.1 0.3 - 1.2  mg/dL   GFR calc non Af Amer >60 >60 mL/min   GFR calc Af Amer >60 >60 mL/min    Comment: (NOTE) The eGFR has been calculated using the CKD EPI equation. This calculation has not been validated in all clinical situations. eGFR's persistently <60 mL/min signify possible Chronic Kidney Disease.    Anion gap 8 5 - 15  Lipase, blood     Status: Abnormal   Collection Time: 08/26/16  4:39 AM  Result Value Ref Range   Lipase 551 (H) 11 - 51 U/L    Comment: RESULTS CONFIRMED BY MANUAL DILUTION  CBC     Status: Abnormal   Collection Time: 08/26/16  4:39 AM  Result Value Ref Range   WBC 7.2 4.0  - 10.5 K/uL   RBC 4.00 (L) 4.22 - 5.81 MIL/uL   Hemoglobin 12.8 (L) 13.0 - 17.0 g/dL   HCT 38.7 (L) 39.0 - 52.0 %   MCV 96.8 78.0 - 100.0 fL   MCH 32.0 26.0 - 34.0 pg   MCHC 33.1 30.0 - 36.0 g/dL   RDW 14.0 11.5 - 15.5 %   Platelets 166 150 - 400 K/uL  CBC     Status: Abnormal   Collection Time: 08/27/16  5:32 AM  Result Value Ref Range   WBC 7.7 4.0 - 10.5 K/uL   RBC 4.20 (L) 4.22 - 5.81 MIL/uL   Hemoglobin 13.5 13.0 - 17.0 g/dL   HCT 40.1 39.0 - 52.0 %   MCV 95.5 78.0 - 100.0 fL   MCH 32.1 26.0 - 34.0 pg   MCHC 33.7 30.0 - 36.0 g/dL   RDW 13.7 11.5 - 15.5 %   Platelets 180 150 - 400 K/uL  Basic metabolic panel     Status: Abnormal   Collection Time: 08/27/16  5:32 AM  Result Value Ref Range   Sodium 136 135 - 145 mmol/L   Potassium 3.9 3.5 - 5.1 mmol/L   Chloride 101 101 - 111 mmol/L   CO2 25 22 - 32 mmol/L   Glucose, Bld 94 65 - 99 mg/dL   BUN <5 (L) 6 - 20 mg/dL   Creatinine, Ser 1.08 0.61 - 1.24 mg/dL   Calcium 8.8 (L) 8.9 - 10.3 mg/dL   GFR calc non Af Amer >60 >60 mL/min   GFR calc Af Amer >60 >60 mL/min    Comment: (NOTE) The eGFR has been calculated using the CKD EPI equation. This calculation has not been validated in all clinical situations. eGFR's persistently <60 mL/min signify possible Chronic Kidney Disease.    Anion gap 10 5 - 15    Studies/Results: Ct Abdomen Pelvis W Contrast  Result Date: 08/25/2016 CLINICAL DATA:  Epigastric pain elevated lipase EXAM: CT ABDOMEN AND PELVIS WITH CONTRAST TECHNIQUE: Multidetector CT imaging of the abdomen and pelvis was performed using the standard protocol following bolus administration of intravenous contrast. CONTRAST:  190m ISOVUE-300 IOPAMIDOL (ISOVUE-300) INJECTION 61% COMPARISON:  06/24/2016 FINDINGS: Lower chest:  Atelectasis at the bases. Hepatobiliary: Tiny low-density in the right liver is stable and too small for densitometry.No evidence of biliary obstruction or stone. Pancreas: Peripancreatic edema  consistent with acute pancreatitis. Uncertain ductal anatomy. A duct in the uncinate process is focally dilated to 7 mm diameter and contains a small stone, new from 2017 Spleen: Unremarkable. Adrenals/Urinary Tract: Negative adrenals. Tiny, essentially nonenhancing right kidney with superimposed cysts. Normal appearance of the left kidney. Cannot evaluate for renal calculi given excretion of urinary contrast. Unremarkable bladder. Stomach/Bowel:  No obstruction. No appendicitis. Vascular/Lymphatic: No acute vascular abnormality. No mass or adenopathy. Reproductive:Negative Other: No ascites or pneumoperitoneum. Musculoskeletal: No acute abnormalities. Degenerative appearing endplate irregularity at L5-S1 IMPRESSION: 1. Acute pancreatitis without necrosis or collection. Short segment of pancreatic duct dilatation in the uncinate process containing a tiny stone. 2. Small non functioning right kidney. Electronically Signed   By: Monte Fantasia M.D.   On: 08/25/2016 15:57   Mr 3d Recon At Scanner  Result Date: 08/26/2016 CLINICAL DATA:  Inpatient admitted with acute pancreatitis. Intermittent upper abdominal pain. Dilated pancreatic duct in the pancreatic head with associated small stone. EXAM: MRI ABDOMEN WITHOUT AND WITH CONTRAST (INCLUDING MRCP) TECHNIQUE: Multiplanar multisequence MR imaging of the abdomen was performed both before and after the administration of intravenous contrast. Heavily T2-weighted images of the biliary and pancreatic ducts were obtained, and three-dimensional MRCP images were rendered by post processing. CONTRAST:  41m MULTIHANCE GADOBENATE DIMEGLUMINE 529 MG/ML IV SOLN COMPARISON:  08/25/2016 CT abdomen/ pelvis. FINDINGS: Lower chest: Mild scarring versus atelectasis in the basilar left lower lobe. Hepatobiliary: Normal liver size and configuration. No hepatic steatosis. There are a few scattered subcentimeter simple cysts in the liver. No suspicious liver masses. Normal gallbladder  with no cholelithiasis. No biliary ductal dilatation. Common bile duct diameter 3 mm. No choledocholithiasis. No biliary stricture or mass. No evidence of an ampullary mass. Pancreas: There is heterogeneous T2 hyperintensity of the pancreatic parenchyma in the pancreatic head with associated mild enlargement of the pancreatic head and peripancreatic edema extending into the anterior paranephric retroperitoneal spaces bilaterally. There is focal dilatation of the dorsal pancreatic duct in the pancreatic head up to 8 mm diameter with associated nonenhancing low T2 signal debris filling this dilated portion of the pancreatic duct. The main pancreatic duct is diffusely mildly irregularly dilated up to 4 mm diameter. These findings suggest acute on chronic pancreatitis. No discrete pancreatic mass. No discrete peripancreatic fluid collection. No pancreas divisum. Spleen: Normal size. No mass. Adrenals/Urinary Tract: Normal adrenals. No hydronephrosis. Stable severe right renal atrophy with no right hydronephrosis and with small simple right renal cysts measuring up to 1.4 cm. Compensatory hypertrophy of the left kidney. No left hydronephrosis. No left renal mass. Stomach/Bowel: Grossly normal stomach. Visualized small and large bowel is normal caliber, with no bowel wall thickening. Vascular/Lymphatic: Normal caliber abdominal aorta. Patent portal, splenic, hepatic and renal veins. No pathologically enlarged lymph nodes in the abdomen. Other: No abdominal ascites or focal fluid collection. Musculoskeletal: No aggressive appearing focal osseous lesions. IMPRESSION: 1. MRI findings are most suggestive of acute on chronic pancreatitis. Nonenhancing low T2 signal debris fills a focally dilated portion of the dorsal pancreatic duct in the pancreatic head. No discrete pancreatic mass. No peripancreatic fluid collections. Follow-up MRI abdomen with MRCP without and with IV contrast recommended in 3-6 months to document  stability of the pancreatic head findings. 2. Normal gallbladder with no cholelithiasis. No biliary ductal dilatation. 3. Stable severe right renal atrophy with compensatory hypertrophy of the left kidney. No acute renal abnormality. Electronically Signed   By: JIlona SorrelM.D.   On: 08/26/2016 15:58   Mr Abdomen Mrcp WMoise BoringContast  Result Date: 08/26/2016 CLINICAL DATA:  Inpatient admitted with acute pancreatitis. Intermittent upper abdominal pain. Dilated pancreatic duct in the pancreatic head with associated small stone. EXAM: MRI ABDOMEN WITHOUT AND WITH CONTRAST (INCLUDING MRCP) TECHNIQUE: Multiplanar multisequence MR imaging of the abdomen was performed both before and after the administration of intravenous contrast. Heavily T2-weighted images  of the biliary and pancreatic ducts were obtained, and three-dimensional MRCP images were rendered by post processing. CONTRAST:  38m MULTIHANCE GADOBENATE DIMEGLUMINE 529 MG/ML IV SOLN COMPARISON:  08/25/2016 CT abdomen/ pelvis. FINDINGS: Lower chest: Mild scarring versus atelectasis in the basilar left lower lobe. Hepatobiliary: Normal liver size and configuration. No hepatic steatosis. There are a few scattered subcentimeter simple cysts in the liver. No suspicious liver masses. Normal gallbladder with no cholelithiasis. No biliary ductal dilatation. Common bile duct diameter 3 mm. No choledocholithiasis. No biliary stricture or mass. No evidence of an ampullary mass. Pancreas: There is heterogeneous T2 hyperintensity of the pancreatic parenchyma in the pancreatic head with associated mild enlargement of the pancreatic head and peripancreatic edema extending into the anterior paranephric retroperitoneal spaces bilaterally. There is focal dilatation of the dorsal pancreatic duct in the pancreatic head up to 8 mm diameter with associated nonenhancing low T2 signal debris filling this dilated portion of the pancreatic duct. The main pancreatic duct is diffusely  mildly irregularly dilated up to 4 mm diameter. These findings suggest acute on chronic pancreatitis. No discrete pancreatic mass. No discrete peripancreatic fluid collection. No pancreas divisum. Spleen: Normal size. No mass. Adrenals/Urinary Tract: Normal adrenals. No hydronephrosis. Stable severe right renal atrophy with no right hydronephrosis and with small simple right renal cysts measuring up to 1.4 cm. Compensatory hypertrophy of the left kidney. No left hydronephrosis. No left renal mass. Stomach/Bowel: Grossly normal stomach. Visualized small and large bowel is normal caliber, with no bowel wall thickening. Vascular/Lymphatic: Normal caliber abdominal aorta. Patent portal, splenic, hepatic and renal veins. No pathologically enlarged lymph nodes in the abdomen. Other: No abdominal ascites or focal fluid collection. Musculoskeletal: No aggressive appearing focal osseous lesions. IMPRESSION: 1. MRI findings are most suggestive of acute on chronic pancreatitis. Nonenhancing low T2 signal debris fills a focally dilated portion of the dorsal pancreatic duct in the pancreatic head. No discrete pancreatic mass. No peripancreatic fluid collections. Follow-up MRI abdomen with MRCP without and with IV contrast recommended in 3-6 months to document stability of the pancreatic head findings. 2. Normal gallbladder with no cholelithiasis. No biliary ductal dilatation. 3. Stable severe right renal atrophy with compensatory hypertrophy of the left kidney. No acute renal abnormality. Electronically Signed   By: JIlona SorrelM.D.   On: 08/26/2016 15:58    Medications: I have reviewed the patient's current medications.  Assessment: Acute on chronic pancreatitis Focal dilatation of dorsal pancreatic duct measuring about 8 mm in pancreatic head Possible filling defect in dilated portion of pancreatic duct, main pancreatic duct diffusely dilated up to 4 mm   Plan: Low Fat diet, continue IV fluids, continue pain  medication as needed. Patient may benefit from pancreatic enzyme supplementation such as Creon, as an inpatient if abdominal pain is not controlled or as an outpatient. Follow-up MRI abdomen with MRCP without and with IV contrast in 3-6 months to document stability of pancreatic head findings. Patient advised to refrain from high fat containing food and alcohol. If by tomorrow patient is able to take his meals completely, okay to discharge in order [place].      ARonnette Juniper5/14/2018, 9:14 AM   Pager [phone] no answer or after 5 PM call [phone]

## 2016-08-27 NOTE — Progress Notes (Signed)
Family Medicine Teaching Service Daily Progress Note Intern Pager: [phone]  Patient name: Kyle Berry Medical record number: [id number] Date of birth: 08-Jan-1971 Age: 46 y.o. Gender: male  Primary Care Provider: Medicine, [facility]: none Code Status: FULL   Pt Overview and Major Events to Date:  Admit 5/12  Assessment and Plan: Kyle Berry is a 46 y.o. male presenting with abdominal pain . PMH is significant for chronic pancreatitis, congenital single kidney, hypertension, high cholesterol, migraines and nephrolithiasis.   Acute on chronic pancreatitis:  Improving.  History of chronic pancreatitis.  On admission with lipase elevated to 3000s. CT abdomen with acute pancreatitis without necrosis or collection, and short segment of pancreatic duct dilatation to 7 mm in the uncinate process containing a "tiny stone".  Discussed CT finding, particularly the stone and the pancreatic duct dilation with [facility], Dr. Alessandra Bevels.  MRCP 5/13 showed acute on chronic pancreatitis. Nonenhancing low T2 signal debris fills a focally dilated portion of the dorsal pancreatic duct in the pancreatic head. No discrete pancreatic mass. No peripancreatic fluid collections. -Continue full liquid diet.  Denies nausea, vomiting this AM but does not feel ready to advance diet. -Will advance diet slowly.  Make NPO if nausea, vomiting.   -IV NS at 75 cc/hour.  Wean IVF once tolerating better po.  -Dilaudid 1 mg every 3 hours PRN for pain  -Zofran 4 mg every 8 hours PRN for nausea  -Pepcid 20 mg BID  -Follow-up MRI abdomen with MRCP without and with IV contrast recommended in 3-6 months to document stability of the pancreatic head findings. -GI following, appreciate recs: (low fat diet, continue IVF, pain medication as needed, may benefit from pancreatic enzyme supplementation such as Creon, as an inpatient if abdominal pain is not controlled or as an outpatient.  Patient advised  to refrain from high fat containing food and alcohol.  If by tomorrow patient is able to take his meals completely, okay to discharge in order to follow up with GI as an outpatient.) -Will continue to monitor for improvement    Hypertension: normotensive. Not on any meds at home -Monitor BPs   Hypokalemia: Resolved. K 3.3 on admission.   Given IV KCl 10 mEq every hour 3.  This AM 3.9.  -check CMP in the morning  FEN/GI:  -Full liquid diet , IVF NS @ 75cc/hour -Pepcid  Prophylaxis: Lovenox  Disposition: Dispo pending clinical improvement.   Subjective:  Patient laying in bed. He appears comfortable. No acute events noted overnight.  Complaining of ongoing epigastric pain.   Denies nausea and vomiting.  Does not feel ready to advance diet to soft today.   Objective: Temp:  [98.1 F (36.7 C)-98.6 F (37 C)] 98.4 F (36.9 C) (05/14 1011) Pulse Rate:  [61-76] 61 (05/14 1011) Resp:  [18] 18 (05/14 1011) BP: (125-142)/(78-82) 135/80 (05/14 1011) SpO2:  [96 %-99 %] 99 % (05/14 1011) Weight:  [180 lb 6.4 oz (81.8 kg)] 180 lb 6.4 oz (81.8 kg) (05/13 2025) Physical Exam: GEN: 46 yo M, gf at bedside, NAD  CVS: RRR, no MRG  RESP: CTAB, normal WOB on RA  GI: soft, +TTP over epigastric region, no rebound or guarding present, +bs  MSK: no focal tenderness or notable swelling SKIN: warm, dry, no skin lesions noted  NEURO: alert and oiented appropriately, no gross defecits   Laboratory:  Recent Labs Lab 08/25/16 1105 08/26/16 0439 08/27/16 0532  WBC 12.1* 7.2 7.7  HGB 14.1 12.8* 13.5  HCT 42.4 38.7* 40.1  PLT 175 166 180    Recent Labs Lab 08/25/16 1105 08/26/16 0439 08/27/16 0532  NA 138 138 136  K 3.3* 3.6 3.9  CL 104 107 101  CO2 24 23 25   BUN 9 6 <5*  CREATININE 1.27* 1.23 1.08  CALCIUM 9.2 7.9* 8.8*  PROT 7.3 5.9*  --   BILITOT 1.2 1.1  --   ALKPHOS 50 41  --   ALT 24 18  --   AST 24 19  --   GLUCOSE 96 67 94   Imaging/Diagnostic Tests: Mr 3d Recon At  Scanner  Result Date: 08/26/2016 CLINICAL DATA:  Inpatient admitted with acute pancreatitis. Intermittent upper abdominal pain. Dilated pancreatic duct in the pancreatic head with associated small stone. EXAM: MRI ABDOMEN WITHOUT AND WITH CONTRAST (INCLUDING MRCP) TECHNIQUE: Multiplanar multisequence MR imaging of the abdomen was performed both before and after the administration of intravenous contrast. Heavily T2-weighted images of the biliary and pancreatic ducts were obtained, and three-dimensional MRCP images were rendered by post processing. CONTRAST:  53mL MULTIHANCE GADOBENATE DIMEGLUMINE 529 MG/ML IV SOLN COMPARISON:  08/25/2016 CT abdomen/ pelvis. FINDINGS: Lower chest: Mild scarring versus atelectasis in the basilar left lower lobe. Hepatobiliary: Normal liver size and configuration. No hepatic steatosis. There are a few scattered subcentimeter simple cysts in the liver. No suspicious liver masses. Normal gallbladder with no cholelithiasis. No biliary ductal dilatation. Common bile duct diameter 3 mm. No choledocholithiasis. No biliary stricture or mass. No evidence of an ampullary mass. Pancreas: There is heterogeneous T2 hyperintensity of the pancreatic parenchyma in the pancreatic head with associated mild enlargement of the pancreatic head and peripancreatic edema extending into the anterior paranephric retroperitoneal spaces bilaterally. There is focal dilatation of the dorsal pancreatic duct in the pancreatic head up to 8 mm diameter with associated nonenhancing low T2 signal debris filling this dilated portion of the pancreatic duct. The main pancreatic duct is diffusely mildly irregularly dilated up to 4 mm diameter. These findings suggest acute on chronic pancreatitis. No discrete pancreatic mass. No discrete peripancreatic fluid collection. No pancreas divisum. Spleen: Normal size. No mass. Adrenals/Urinary Tract: Normal adrenals. No hydronephrosis. Stable severe right renal atrophy with no  right hydronephrosis and with small simple right renal cysts measuring up to 1.4 cm. Compensatory hypertrophy of the left kidney. No left hydronephrosis. No left renal mass. Stomach/Bowel: Grossly normal stomach. Visualized small and large bowel is normal caliber, with no bowel wall thickening. Vascular/Lymphatic: Normal caliber abdominal aorta. Patent portal, splenic, hepatic and renal veins. No pathologically enlarged lymph nodes in the abdomen. Other: No abdominal ascites or focal fluid collection. Musculoskeletal: No aggressive appearing focal osseous lesions. IMPRESSION: 1. MRI findings are most suggestive of acute on chronic pancreatitis. Nonenhancing low T2 signal debris fills a focally dilated portion of the dorsal pancreatic duct in the pancreatic head. No discrete pancreatic mass. No peripancreatic fluid collections. Follow-up MRI abdomen with MRCP without and with IV contrast recommended in 3-6 months to document stability of the pancreatic head findings. 2. Normal gallbladder with no cholelithiasis. No biliary ductal dilatation. 3. Stable severe right renal atrophy with compensatory hypertrophy of the left kidney. No acute renal abnormality. Electronically Signed   By: Ilona Sorrel M.D.   On: 08/26/2016 15:58   Mr Abdomen Mrcp Moise Boring Contast  Result Date: 08/26/2016 CLINICAL DATA:  Inpatient admitted with acute pancreatitis. Intermittent upper abdominal pain. Dilated pancreatic duct in the pancreatic head with associated small stone. EXAM: MRI ABDOMEN WITHOUT  AND WITH CONTRAST (INCLUDING MRCP) TECHNIQUE: Multiplanar multisequence MR imaging of the abdomen was performed both before and after the administration of intravenous contrast. Heavily T2-weighted images of the biliary and pancreatic ducts were obtained, and three-dimensional MRCP images were rendered by post processing. CONTRAST:  12mL MULTIHANCE GADOBENATE DIMEGLUMINE 529 MG/ML IV SOLN COMPARISON:  08/25/2016 CT abdomen/ pelvis. FINDINGS: Lower  chest: Mild scarring versus atelectasis in the basilar left lower lobe. Hepatobiliary: Normal liver size and configuration. No hepatic steatosis. There are a few scattered subcentimeter simple cysts in the liver. No suspicious liver masses. Normal gallbladder with no cholelithiasis. No biliary ductal dilatation. Common bile duct diameter 3 mm. No choledocholithiasis. No biliary stricture or mass. No evidence of an ampullary mass. Pancreas: There is heterogeneous T2 hyperintensity of the pancreatic parenchyma in the pancreatic head with associated mild enlargement of the pancreatic head and peripancreatic edema extending into the anterior paranephric retroperitoneal spaces bilaterally. There is focal dilatation of the dorsal pancreatic duct in the pancreatic head up to 8 mm diameter with associated nonenhancing low T2 signal debris filling this dilated portion of the pancreatic duct. The main pancreatic duct is diffusely mildly irregularly dilated up to 4 mm diameter. These findings suggest acute on chronic pancreatitis. No discrete pancreatic mass. No discrete peripancreatic fluid collection. No pancreas divisum. Spleen: Normal size. No mass. Adrenals/Urinary Tract: Normal adrenals. No hydronephrosis. Stable severe right renal atrophy with no right hydronephrosis and with small simple right renal cysts measuring up to 1.4 cm. Compensatory hypertrophy of the left kidney. No left hydronephrosis. No left renal mass. Stomach/Bowel: Grossly normal stomach. Visualized small and large bowel is normal caliber, with no bowel wall thickening. Vascular/Lymphatic: Normal caliber abdominal aorta. Patent portal, splenic, hepatic and renal veins. No pathologically enlarged lymph nodes in the abdomen. Other: No abdominal ascites or focal fluid collection. Musculoskeletal: No aggressive appearing focal osseous lesions. IMPRESSION: 1. MRI findings are most suggestive of acute on chronic pancreatitis. Nonenhancing low T2 signal debris  fills a focally dilated portion of the dorsal pancreatic duct in the pancreatic head. No discrete pancreatic mass. No peripancreatic fluid collections. Follow-up MRI abdomen with MRCP without and with IV contrast recommended in 3-6 months to document stability of the pancreatic head findings. 2. Normal gallbladder with no cholelithiasis. No biliary ductal dilatation. 3. Stable severe right renal atrophy with compensatory hypertrophy of the left kidney. No acute renal abnormality. Electronically Signed   By: Ilona Sorrel M.D.   On: 08/26/2016 15:58    Lovenia Kim, MD 08/27/2016, 11:34 AM PGY-1, [place] Intern pager: [phone], text pages welcome

## 2016-08-28 LAB — BASIC METABOLIC PANEL
Anion gap: 9 (ref 5–15)
BUN: <5 mg/dL — ABNORMAL LOW (ref 6–20)
CHLORIDE: 101 mmol/L (ref 101–111)
CO2: 27 mmol/L (ref 22–32)
CREATININE: 1.01 mg/dL (ref 0.61–1.24)
Calcium: 9.1 mg/dL (ref 8.9–10.3)
GFR calc non Af Amer: >60 mL/min (ref >60)
Glucose, Bld: 96 mg/dL (ref 65–99)
POTASSIUM: 3.6 mmol/L (ref 3.5–5.1)
SODIUM: 137 mmol/L (ref 135–145)

## 2016-08-28 LAB — CBC
HEMATOCRIT: 40.7 % (ref 39.0–52.0)
Hemoglobin: 13.6 g/dL (ref 13.0–17.0)
MCH: 31.5 pg (ref 26.0–34.0)
MCHC: 33.4 g/dL (ref 30.0–36.0)
MCV: 94.2 fL (ref 78.0–100.0)
PLATELETS: 178 10*3/uL (ref 150–400)
RBC: 4.32 MIL/uL (ref 4.22–5.81)
RDW: 13.4 % (ref 11.5–15.5)
WBC: 6.6 10*3/uL (ref 4.0–10.5)

## 2016-08-28 MED ORDER — OXYCODONE HCL 5 MG PO TABS
5.0000 mg | ORAL_TABLET | Freq: Four times a day (QID) | ORAL | Status: DC | PRN
  Administered 2016-08-28: 5 mg via ORAL
  Filled 2016-08-28: qty 1

## 2016-08-28 MED ORDER — HYDROMORPHONE HCL 1 MG/ML IJ SOLN
1.0000 mg | INTRAMUSCULAR | Status: DC | PRN
  Administered 2016-08-28 – 2016-08-29 (×8): 1 mg via INTRAVENOUS
  Filled 2016-08-28 (×8): qty 1

## 2016-08-28 MED ORDER — OXYCODONE HCL 5 MG PO TABS
10.0000 mg | ORAL_TABLET | Freq: Four times a day (QID) | ORAL | Status: DC | PRN
  Administered 2016-08-28: 5 mg via ORAL
  Filled 2016-08-28: qty 2

## 2016-08-28 MED ORDER — ALUM & MAG HYDROXIDE-SIMETH 200-200-20 MG/5ML PO SUSP
15.0000 mL | ORAL | Status: DC | PRN
  Administered 2016-08-28: 15 mL via ORAL
  Filled 2016-08-28: qty 30

## 2016-08-28 MED ORDER — SODIUM CHLORIDE 0.9 % IV SOLN
INTRAVENOUS | Status: DC
  Administered 2016-08-29 – 2016-08-30 (×2): via INTRAVENOUS

## 2016-08-28 MED ORDER — ONDANSETRON HCL 4 MG/2ML IJ SOLN
4.0000 mg | Freq: Four times a day (QID) | INTRAMUSCULAR | Status: DC | PRN
  Administered 2016-08-29 (×2): 4 mg via INTRAVENOUS
  Filled 2016-08-28 (×4): qty 2

## 2016-08-28 MED ORDER — PANCRELIPASE (LIP-PROT-AMYL) 12000-38000 UNITS PO CPEP
12000.0000 [IU] | ORAL_CAPSULE | Freq: Three times a day (TID) | ORAL | Status: DC
  Administered 2016-08-28 – 2016-08-30 (×7): 12000 [IU] via ORAL
  Filled 2016-08-28 (×5): qty 1

## 2016-08-28 NOTE — Progress Notes (Signed)
Subjective: The patient was seen and examined at bedside. He reports that abdominal pain has gotten worse. It is aggravated with by mouth diet and only relieved with IV pain medications. He was able to tolerate grits and juices for breakfast yesterday as well as lunch, but as he had nausea and vomiting he did not eat anything for dinner. Today morning he was able to tolerate grits and juice again for breakfast. He has not had a bowel movement since Friday(4 days).  Objective: Vital signs in last 24 hours: Temp:  [98.3 F (36.8 C)-99 F (37.2 C)] 98.3 F (36.8 C) (05/15 0550) Pulse Rate:  [61-73] 66 (05/15 0550) Resp:  [18] 18 (05/15 0550) BP: (131-157)/(76-82) 131/76 (05/15 0550) SpO2:  [98 %-100 %] 99 % (05/15 0550) Weight:  [82.7 kg (182 lb 6.4 oz)] 82.7 kg (182 lb 6.4 oz) (05/14 2045) Weight change: 0.907 kg (2 lb) Last BM Date: 08/25/16  PE: Lying on bed, appears in mild distress secondary to pain GENERAL: No signs of dehydration, no obvious pallor, no icterus ABDOMEN: Soft, tenderness appreciated in the epigastric area and right upper quadrant, hyperactive bowel sounds EXTREMITIES: No edema, no deformity  Lab Results: Results for orders placed or performed during the hospital encounter of 08/25/16 (from the past 48 hour(s))  CBC     Status: Abnormal   Collection Time: 08/27/16  5:32 AM  Result Value Ref Range   WBC 7.7 4.0 - 10.5 K/uL   RBC 4.20 (L) 4.22 - 5.81 MIL/uL   Hemoglobin 13.5 13.0 - 17.0 g/dL   HCT 40.1 39.0 - 52.0 %   MCV 95.5 78.0 - 100.0 fL   MCH 32.1 26.0 - 34.0 pg   MCHC 33.7 30.0 - 36.0 g/dL   RDW 13.7 11.5 - 15.5 %   Platelets 180 150 - 400 K/uL  Basic metabolic panel     Status: Abnormal   Collection Time: 08/27/16  5:32 AM  Result Value Ref Range   Sodium 136 135 - 145 mmol/L   Potassium 3.9 3.5 - 5.1 mmol/L   Chloride 101 101 - 111 mmol/L   CO2 25 22 - 32 mmol/L   Glucose, Bld 94 65 - 99 mg/dL   BUN <5 (L) 6 - 20 mg/dL   Creatinine, Ser 1.08  0.61 - 1.24 mg/dL   Calcium 8.8 (L) 8.9 - 10.3 mg/dL   GFR calc non Af Amer >60 >60 mL/min   GFR calc Af Amer >60 >60 mL/min    Comment: (NOTE) The eGFR has been calculated using the CKD EPI equation. This calculation has not been validated in all clinical situations. eGFR's persistently <60 mL/min signify possible Chronic Kidney Disease.    Anion gap 10 5 - 15  CBC     Status: None   Collection Time: 08/28/16  5:40 AM  Result Value Ref Range   WBC 6.6 4.0 - 10.5 K/uL   RBC 4.32 4.22 - 5.81 MIL/uL   Hemoglobin 13.6 13.0 - 17.0 g/dL   HCT 40.7 39.0 - 52.0 %   MCV 94.2 78.0 - 100.0 fL   MCH 31.5 26.0 - 34.0 pg   MCHC 33.4 30.0 - 36.0 g/dL   RDW 13.4 11.5 - 15.5 %   Platelets 178 150 - 400 K/uL  Basic metabolic panel     Status: Abnormal   Collection Time: 08/28/16  5:40 AM  Result Value Ref Range   Sodium 137 135 - 145 mmol/L   Potassium 3.6 3.5 - 5.1  mmol/L   Chloride 101 101 - 111 mmol/L   CO2 27 22 - 32 mmol/L   Glucose, Bld 96 65 - 99 mg/dL   BUN <5 (L) 6 - 20 mg/dL   Creatinine, Ser 1.01 0.61 - 1.24 mg/dL   Calcium 9.1 8.9 - 10.3 mg/dL   GFR calc non Af Amer >60 >60 mL/min   GFR calc Af Amer >60 >60 mL/min    Comment: (NOTE) The eGFR has been calculated using the CKD EPI equation. This calculation has not been validated in all clinical situations. eGFR's persistently <60 mL/min signify possible Chronic Kidney Disease.    Anion gap 9 5 - 15    Studies/Results: Mr 3d Recon At Scanner  Result Date: 08/26/2016 CLINICAL DATA:  Inpatient admitted with acute pancreatitis. Intermittent upper abdominal pain. Dilated pancreatic duct in the pancreatic head with associated small stone. EXAM: MRI ABDOMEN WITHOUT AND WITH CONTRAST (INCLUDING MRCP) TECHNIQUE: Multiplanar multisequence MR imaging of the abdomen was performed both before and after the administration of intravenous contrast. Heavily T2-weighted images of the biliary and pancreatic ducts were obtained, and  three-dimensional MRCP images were rendered by post processing. CONTRAST:  26m MULTIHANCE GADOBENATE DIMEGLUMINE 529 MG/ML IV SOLN COMPARISON:  08/25/2016 CT abdomen/ pelvis. FINDINGS: Lower chest: Mild scarring versus atelectasis in the basilar left lower lobe. Hepatobiliary: Normal liver size and configuration. No hepatic steatosis. There are a few scattered subcentimeter simple cysts in the liver. No suspicious liver masses. Normal gallbladder with no cholelithiasis. No biliary ductal dilatation. Common bile duct diameter 3 mm. No choledocholithiasis. No biliary stricture or mass. No evidence of an ampullary mass. Pancreas: There is heterogeneous T2 hyperintensity of the pancreatic parenchyma in the pancreatic head with associated mild enlargement of the pancreatic head and peripancreatic edema extending into the anterior paranephric retroperitoneal spaces bilaterally. There is focal dilatation of the dorsal pancreatic duct in the pancreatic head up to 8 mm diameter with associated nonenhancing low T2 signal debris filling this dilated portion of the pancreatic duct. The main pancreatic duct is diffusely mildly irregularly dilated up to 4 mm diameter. These findings suggest acute on chronic pancreatitis. No discrete pancreatic mass. No discrete peripancreatic fluid collection. No pancreas divisum. Spleen: Normal size. No mass. Adrenals/Urinary Tract: Normal adrenals. No hydronephrosis. Stable severe right renal atrophy with no right hydronephrosis and with small simple right renal cysts measuring up to 1.4 cm. Compensatory hypertrophy of the left kidney. No left hydronephrosis. No left renal mass. Stomach/Bowel: Grossly normal stomach. Visualized small and large bowel is normal caliber, with no bowel wall thickening. Vascular/Lymphatic: Normal caliber abdominal aorta. Patent portal, splenic, hepatic and renal veins. No pathologically enlarged lymph nodes in the abdomen. Other: No abdominal ascites or focal fluid  collection. Musculoskeletal: No aggressive appearing focal osseous lesions. IMPRESSION: 1. MRI findings are most suggestive of acute on chronic pancreatitis. Nonenhancing low T2 signal debris fills a focally dilated portion of the dorsal pancreatic duct in the pancreatic head. No discrete pancreatic mass. No peripancreatic fluid collections. Follow-up MRI abdomen with MRCP without and with IV contrast recommended in 3-6 months to document stability of the pancreatic head findings. 2. Normal gallbladder with no cholelithiasis. No biliary ductal dilatation. 3. Stable severe right renal atrophy with compensatory hypertrophy of the left kidney. No acute renal abnormality. Electronically Signed   By: JIlona SorrelM.D.   On: 08/26/2016 15:58   Mr Abdomen Mrcp WMoise BoringContast  Result Date: 08/26/2016 CLINICAL DATA:  Inpatient admitted with acute pancreatitis. Intermittent  upper abdominal pain. Dilated pancreatic duct in the pancreatic head with associated small stone. EXAM: MRI ABDOMEN WITHOUT AND WITH CONTRAST (INCLUDING MRCP) TECHNIQUE: Multiplanar multisequence MR imaging of the abdomen was performed both before and after the administration of intravenous contrast. Heavily T2-weighted images of the biliary and pancreatic ducts were obtained, and three-dimensional MRCP images were rendered by post processing. CONTRAST:  38m MULTIHANCE GADOBENATE DIMEGLUMINE 529 MG/ML IV SOLN COMPARISON:  08/25/2016 CT abdomen/ pelvis. FINDINGS: Lower chest: Mild scarring versus atelectasis in the basilar left lower lobe. Hepatobiliary: Normal liver size and configuration. No hepatic steatosis. There are a few scattered subcentimeter simple cysts in the liver. No suspicious liver masses. Normal gallbladder with no cholelithiasis. No biliary ductal dilatation. Common bile duct diameter 3 mm. No choledocholithiasis. No biliary stricture or mass. No evidence of an ampullary mass. Pancreas: There is heterogeneous T2 hyperintensity of the  pancreatic parenchyma in the pancreatic head with associated mild enlargement of the pancreatic head and peripancreatic edema extending into the anterior paranephric retroperitoneal spaces bilaterally. There is focal dilatation of the dorsal pancreatic duct in the pancreatic head up to 8 mm diameter with associated nonenhancing low T2 signal debris filling this dilated portion of the pancreatic duct. The main pancreatic duct is diffusely mildly irregularly dilated up to 4 mm diameter. These findings suggest acute on chronic pancreatitis. No discrete pancreatic mass. No discrete peripancreatic fluid collection. No pancreas divisum. Spleen: Normal size. No mass. Adrenals/Urinary Tract: Normal adrenals. No hydronephrosis. Stable severe right renal atrophy with no right hydronephrosis and with small simple right renal cysts measuring up to 1.4 cm. Compensatory hypertrophy of the left kidney. No left hydronephrosis. No left renal mass. Stomach/Bowel: Grossly normal stomach. Visualized small and large bowel is normal caliber, with no bowel wall thickening. Vascular/Lymphatic: Normal caliber abdominal aorta. Patent portal, splenic, hepatic and renal veins. No pathologically enlarged lymph nodes in the abdomen. Other: No abdominal ascites or focal fluid collection. Musculoskeletal: No aggressive appearing focal osseous lesions. IMPRESSION: 1. MRI findings are most suggestive of acute on chronic pancreatitis. Nonenhancing low T2 signal debris fills a focally dilated portion of the dorsal pancreatic duct in the pancreatic head. No discrete pancreatic mass. No peripancreatic fluid collections. Follow-up MRI abdomen with MRCP without and with IV contrast recommended in 3-6 months to document stability of the pancreatic head findings. 2. Normal gallbladder with no cholelithiasis. No biliary ductal dilatation. 3. Stable severe right renal atrophy with compensatory hypertrophy of the left kidney. No acute renal abnormality.  Electronically Signed   By: JIlona SorrelM.D.   On: 08/26/2016 15:58    Medications: I have reviewed the patient's current medications.  Assessment: Acute on chronic pancreatitis, BISAP Score 0/ 5 signify mild pancreatitis Unrelenting abdominal pain Possible pancreatic duct stone  Plan: We will add Creon 12,000 units by mouth 3 times a day with meals. Will increase IV fluids normal saline to 125 mL an hour. Continue IV Dilaudid 1 mg every 3 hours as needed and Zofran 4 mg IV every 8 hours as needed for nausea and vomiting. [facility] an [facility] Would not advance his diet currently, unless pain improves in the next 24 hours.   ARonnette Juniper5/15/2018, 9:39 AM   Pager [phone] no answer or after 5 PM call [phone]

## 2016-08-28 NOTE — Progress Notes (Signed)
Family Medicine Teaching Service Daily Progress Note Intern Pager: [phone]  Patient name: Kyle Berry Medical record number: [id number] Date of birth: 07-05-1970 Age: 46 y.o. Gender: male  Primary Care Provider: Medicine, [facility] Consultants: none Code Status: FULL   Pt Overview and Major Events to Date:  Admit 5/12  Assessment and Plan: Kyle Berry is a 46 y.o. male presenting with abdominal pain . PMH is significant for chronic pancreatitis, congenital single kidney, hypertension, high cholesterol, migraines and nephrolithiasis.   Acute on chronic pancreatitis: Pain unchanged.  Afebrile and VSS overnight.  MRCP 5/13 showed acute on chronic pancreatitis. Nonenhancing low T2 signal debris fills a focally dilated portion of the dorsal pancreatic duct in the pancreatic head. No discrete pancreatic mass. No peripancreatic fluid collections.   -Continue full liquid diet.  Complaining of nausea, vomiting x 1 yesterday but does not feel ready to advance diet due to pain.  -Per GI, will hold off on advancing diet unless pain improves over next 24h.  Will make NPO if nausea, vomiting.   -Increase IV NS to 125 cc/hour.  Wean IVF once tolerating better po.  -Will transition to po pain medication.  Oxycodone 5 mg Q6 PRN with Dilaudid 1 mg every 3 hours PRN for pain.  -Zofran 4 mg every 8 hours PRN for nausea  -Pepcid 20 mg BID  -Follow-up MRI abdomen with MRCP without and with IV contrast recommended in 3-6 months to document stability of the pancreatic head findings. -GI following, appreciate recs: Creon added, increase IVF to 125 cc/hour, continue pain control and zofran.  As outpatient may benefit from referral to [facility] or [facility] as outpatient for possible pancreatic stone removal.   -Will continue to monitor for improvement    Hypertension: normotensive. BP this AM 131/76. Not on any meds at home -Monitor BPs   Hypokalemia: Resolved. K 3.3 on admission.   Given IV  KCl 10 mEq every hour 3.  This AM 3.6.  -check CMP in the morning   FEN/GI:  -Full liquid diet, IVF NS @ 125cc/hour -Pepcid  Prophylaxis: Lovenox   Disposition: Dispo pending improvement in pain and able to tolerate po.   Subjective:  Patient laying in bed. He appears uncomfortable. One episode of vomiting yesterday and continued nausea.  Does not feel like he can advance his diet due to pain.  Pain is located in epigastric region and intermittently radiates to his back.     Objective: Temp:  [98.2 F (36.8 C)-99 F (37.2 C)] 98.2 F (36.8 C) (05/15 1000) Pulse Rate:  [64-73] 64 (05/15 1000) Resp:  [18] 18 (05/15 1000) BP: (131-157)/(76-85) 145/85 (05/15 1000) SpO2:  [98 %-100 %] 99 % (05/15 1000) Weight:  [182 lb 6.4 oz (82.7 kg)] 182 lb 6.4 oz (82.7 kg) (05/14 2045) Physical Exam: GEN: 46 yo M, sleeping in bed, appears uncomfortable, NAD  CVS: RRR, no MRG, palpable pulses  RESP: CTAB, normal WOB on RA  GI: soft, +TTP over epigastric region, no rebound or guarding present, +bs  MSK: normal ROM  SKIN: warm, dry, no skin lesions noted  NEURO: AOx3, no focal deficits   Laboratory:  Recent Labs Lab 08/26/16 0439 08/27/16 0532 08/28/16 0540  WBC 7.2 7.7 6.6  HGB 12.8* 13.5 13.6  HCT 38.7* 40.1 40.7  PLT 166 180 178    Recent Labs Lab 08/25/16 1105 08/26/16 0439 08/27/16 0532 08/28/16 0540  NA 138 138 136 137  K 3.3* 3.6 3.9 3.6  CL  104 107 101 101  CO2 24 23 25 27   BUN 9 6 <5* <5*  CREATININE 1.27* 1.23 1.08 1.01  CALCIUM 9.2 7.9* 8.8* 9.1  PROT 7.3 5.9*  --   --   BILITOT 1.2 1.1  --   --   ALKPHOS 50 41  --   --   ALT 24 18  --   --   AST 24 19  --   --   GLUCOSE 96 67 94 96   Imaging/Diagnostic Tests: No results found.  Lovenia Kim, MD 08/28/2016, 2:57 PM PGY-1, [place] Intern pager: [phone], text pages welcome

## 2016-08-29 LAB — BASIC METABOLIC PANEL
ANION GAP: 13 (ref 5–15)
BUN: <5 mg/dL — ABNORMAL LOW (ref 6–20)
CO2: 23 mmol/L (ref 22–32)
Calcium: 9 mg/dL (ref 8.9–10.3)
Chloride: 100 mmol/L — ABNORMAL LOW (ref 101–111)
Creatinine, Ser: 1.04 mg/dL (ref 0.61–1.24)
GFR calc Af Amer: >60 mL/min (ref >60)
GLUCOSE: 84 mg/dL (ref 65–99)
POTASSIUM: 3.3 mmol/L — AB (ref 3.5–5.1)
Sodium: 136 mmol/L (ref 135–145)

## 2016-08-29 LAB — CBC
HEMATOCRIT: 39.2 % (ref 39.0–52.0)
HEMOGLOBIN: 13.1 g/dL (ref 13.0–17.0)
MCH: 31.4 pg (ref 26.0–34.0)
MCHC: 33.4 g/dL (ref 30.0–36.0)
MCV: 94 fL (ref 78.0–100.0)
PLATELETS: 202 10*3/uL (ref 150–400)
RBC: 4.17 MIL/uL — ABNORMAL LOW (ref 4.22–5.81)
RDW: 13.3 % (ref 11.5–15.5)
WBC: 8.3 10*3/uL (ref 4.0–10.5)

## 2016-08-29 MED ORDER — HYDROMORPHONE HCL 1 MG/ML IJ SOLN
1.0000 mg | INTRAMUSCULAR | Status: DC | PRN
  Administered 2016-08-29 – 2016-08-30 (×5): 1 mg via INTRAVENOUS
  Filled 2016-08-29 (×5): qty 1

## 2016-08-29 MED ORDER — POTASSIUM CHLORIDE CRYS ER 20 MEQ PO TBCR
40.0000 meq | EXTENDED_RELEASE_TABLET | Freq: Two times a day (BID) | ORAL | Status: AC
Start: 2016-08-29 — End: 2016-08-29
  Administered 2016-08-29 (×2): 40 meq via ORAL
  Filled 2016-08-29 (×3): qty 2

## 2016-08-29 MED ORDER — SENNOSIDES-DOCUSATE SODIUM 8.6-50 MG PO TABS
1.0000 | ORAL_TABLET | Freq: Every day | ORAL | Status: DC
  Administered 2016-08-29 – 2016-08-30 (×2): 1 via ORAL
  Filled 2016-08-29 (×2): qty 1

## 2016-08-29 NOTE — Progress Notes (Signed)
Subjective: Patient was seen and examined at bedside. He reports marked improvement in abdominal pain, however, he had 2 episodes of vomiting today which he attributes to being on empty stomach. He has not had a bowel movement since admission. Currently he does not have any further nausea.  Objective: Vital signs in last 24 hours: Temp:  [97.8 F (36.6 C)-98.6 F (37 C)] 98.5 F (36.9 C) (05/16 0931) Pulse Rate:  [62-75] 71 (05/16 0931) Resp:  [18] 18 (05/16 0931) BP: (144-157)/(83-91) 156/91 (05/16 0931) SpO2:  [98 %] 98 % (05/16 0931) Weight:  [79.7 kg (175 lb 11.2 oz)] 79.7 kg (175 lb 11.2 oz) (05/15 2038) Weight change: -3.039 kg (-6 lb 11.2 oz) Last BM Date: 08/25/16  PE: Not in acute distress, appears comfortable GENERAL: No obvious pallor, no icterus, moist oral mucosa ABDOMEN: Soft, nondistended, nontender, hyperactive bowel sounds EXTREMITIES: No deformity clubbing no edema  Lab Results: Results for orders placed or performed during the hospital encounter of 08/25/16 (from the past 48 hour(s))  CBC     Status: None   Collection Time: 08/28/16  5:40 AM  Result Value Ref Range   WBC 6.6 4.0 - 10.5 K/uL   RBC 4.32 4.22 - 5.81 MIL/uL   Hemoglobin 13.6 13.0 - 17.0 g/dL   HCT 40.7 39.0 - 52.0 %   MCV 94.2 78.0 - 100.0 fL   MCH 31.5 26.0 - 34.0 pg   MCHC 33.4 30.0 - 36.0 g/dL   RDW 13.4 11.5 - 15.5 %   Platelets 178 150 - 400 K/uL  Basic metabolic panel     Status: Abnormal   Collection Time: 08/28/16  5:40 AM  Result Value Ref Range   Sodium 137 135 - 145 mmol/L   Potassium 3.6 3.5 - 5.1 mmol/L   Chloride 101 101 - 111 mmol/L   CO2 27 22 - 32 mmol/L   Glucose, Bld 96 65 - 99 mg/dL   BUN <5 (L) 6 - 20 mg/dL   Creatinine, Ser 1.01 0.61 - 1.24 mg/dL   Calcium 9.1 8.9 - 10.3 mg/dL   GFR calc non Af Amer >60 >60 mL/min   GFR calc Af Amer >60 >60 mL/min    Comment: (NOTE) The eGFR has been calculated using the CKD EPI equation. This calculation has not been validated  in all clinical situations. eGFR's persistently <60 mL/min signify possible Chronic Kidney Disease.    Anion gap 9 5 - 15  CBC     Status: Abnormal   Collection Time: 08/29/16  6:54 AM  Result Value Ref Range   WBC 8.3 4.0 - 10.5 K/uL   RBC 4.17 (L) 4.22 - 5.81 MIL/uL   Hemoglobin 13.1 13.0 - 17.0 g/dL   HCT 39.2 39.0 - 52.0 %   MCV 94.0 78.0 - 100.0 fL   MCH 31.4 26.0 - 34.0 pg   MCHC 33.4 30.0 - 36.0 g/dL   RDW 13.3 11.5 - 15.5 %   Platelets 202 150 - 400 K/uL  Basic metabolic panel     Status: Abnormal   Collection Time: 08/29/16  6:54 AM  Result Value Ref Range   Sodium 136 135 - 145 mmol/L   Potassium 3.3 (L) 3.5 - 5.1 mmol/L   Chloride 100 (L) 101 - 111 mmol/L   CO2 23 22 - 32 mmol/L   Glucose, Bld 84 65 - 99 mg/dL   BUN <5 (L) 6 - 20 mg/dL   Creatinine, Ser 1.04 0.61 - 1.24 mg/dL  Calcium 9.0 8.9 - 10.3 mg/dL   GFR calc non Af Amer >60 >60 mL/min   GFR calc Af Amer >60 >60 mL/min    Comment: (NOTE) The eGFR has been calculated using the CKD EPI equation. This calculation has not been validated in all clinical situations. eGFR's persistently <60 mL/min signify possible Chronic Kidney Disease.    Anion gap 13 5 - 15    Studies/Results: No results found.  Medications: I have reviewed the patient's current medications.  Assessment: Acute on chronic pancreatitis Low BISAP score, currently on normal saline at 125 mL per hour, started on Creon from yesterday. Labs unremarkable from today.  Plan: Low fat diet in the evening, use IV anti-emetic 30 minutes before scheduled meals. Will decrease IV fluids 100 mL per hour, continue IV Dilaudid 1 mg every 3 hours as needed.   Ronnette Juniper 08/29/2016, 2:09 PM   Pager [phone] If no answer or after 5 PM call [phone]

## 2016-08-29 NOTE — Progress Notes (Signed)
Family Medicine Teaching Service Daily Progress Note Intern Pager: [phone]  Patient name: Kyle Berry Medical record number: [id number] Date of birth: Mar 07, 1971 Age: 46 y.o. Gender: male  Primary Care Provider: Medicine, [facility] Consultants: none Code Status: FULL   Pt Overview and Major Events to Date:  Admit 5/12  Assessment and Plan: Kyle Berry is a 46 y.o. male presenting with abdominal pain . PMH is significant for chronic pancreatitis, congenital single kidney, hypertension, high cholesterol, migraines and nephrolithiasis.   Acute on chronic pancreatitis: Per patient, pain is worse.   Complaining of nausea, does not feel ready to advance diet due to pain.  Afebrile and VSS overnight except for elevated blood pressures.  MRCP 5/13 showed acute on chronic pancreatitis. Nonenhancing low T2 signal debris fills a focally dilated portion of the dorsal pancreatic duct in the pancreatic head. No discrete pancreatic mass. No peripancreatic fluid collections.   -Will change diet to clear liquids from full liquid.  -Per GI, will hold off on advancing diet unless pain improves over next 24h.  Will make NPO if nausea, vomiting.   - IV NS to 125 cc/hour.  Wean IVF once tolerating better po.  -Transition back to IV Dilaudid 1 mg Q2 PRN today from po pain medications  -Zofran 4 mg every 8 hours PRN for nausea  -Pepcid 20 mg BID  - Colace added this AM given his last BM Saturday and patient has received pain medications.  -Follow-up MRI abdomen with MRCP without and with IV contrast recommended in 3-6 months to document stability of the pancreatic head findings. -GI following, appreciate recs: Creon added,  continue pain control and zofran.  As outpatient may benefit from referral to [facility] or [facility] as outpatient for possible pancreatic stone removal.   -Will continue to monitor for improvement    Hypertension: Elevated BPs overnight likely 2/2 pain.  150s SBP.   Not on  any meds at home -Pain control  -Monitor BPs   Hypokalemia:  This AM 3.3.  -Kdur to replete  75mEq x2  -check CMP in the morning   FEN/GI:  -Clear liquid diet, IVF NS @ 125cc/hour -Pepcid  Prophylaxis: Lovenox   Disposition: Dispo pending improvement in pain and able to tolerate po.   Subjective:  Patient notes abdominal pain is worse.  Does not feel like eating anything.  Continues to have nausea with no vomiting.  Last BM Saturday.     Objective: Temp:  [97.8 F (36.6 C)-98.6 F (37 C)] 98.5 F (36.9 C) (05/16 0931) Pulse Rate:  [62-75] 71 (05/16 0931) Resp:  [18] 18 (05/16 0931) BP: (144-157)/(83-91) 156/91 (05/16 0931) SpO2:  [98 %] 98 % (05/16 0931) Weight:  [175 lb 11.2 oz (79.7 kg)] 175 lb 11.2 oz (79.7 kg) (05/15 2038)   Physical Exam: GEN: 46 yo M, sleeping in bed, sister at bedside, NAD  CVS: RRR, no MRG, palpable pulses   RESP: CTAB, normal WOB on RA   GI: soft, mild distention, +TTP over epigastric region, no rebound or guarding present, +bs  MSK: normal ROM  SKIN: warm, dry, no skin lesions noted  NEURO: AOx3, no focal deficits   Laboratory:  Recent Labs Lab 08/27/16 0532 08/28/16 0540 08/29/16 0654  WBC 7.7 6.6 8.3  HGB 13.5 13.6 13.1  HCT 40.1 40.7 39.2  PLT 180 178 202    Recent Labs Lab 08/25/16 1105 08/26/16 0439 08/27/16 0532 08/28/16 0540 08/29/16 0654  NA 138 138 136 137 136  K 3.3* 3.6 3.9 3.6 3.3*  CL 104 107 101 101 100*  CO2 24 23 25 27 23   BUN 9 6 <5* <5* <5*  CREATININE 1.27* 1.23 1.08 1.01 1.04  CALCIUM 9.2 7.9* 8.8* 9.1 9.0  PROT 7.3 5.9*  --   --   --   BILITOT 1.2 1.1  --   --   --   ALKPHOS 50 41  --   --   --   ALT 24 18  --   --   --   AST 24 19  --   --   --   GLUCOSE 96 67 94 96 84   Imaging/Diagnostic Tests: No results found.  Lovenia Kim, MD 08/29/2016, 2:16 PM PGY-1, [place] Intern pager: [phone], text pages welcome

## 2016-08-30 LAB — BASIC METABOLIC PANEL
ANION GAP: 9 (ref 5–15)
BUN: 5 mg/dL — ABNORMAL LOW (ref 6–20)
CO2: 25 mmol/L (ref 22–32)
Calcium: 9.1 mg/dL (ref 8.9–10.3)
Chloride: 103 mmol/L (ref 101–111)
Creatinine, Ser: 1.08 mg/dL (ref 0.61–1.24)
GLUCOSE: 98 mg/dL (ref 65–99)
POTASSIUM: 4.2 mmol/L (ref 3.5–5.1)
SODIUM: 137 mmol/L (ref 135–145)

## 2016-08-30 LAB — CBC
HCT: 38 % — ABNORMAL LOW (ref 39.0–52.0)
Hemoglobin: 12.6 g/dL — ABNORMAL LOW (ref 13.0–17.0)
MCH: 31.1 pg (ref 26.0–34.0)
MCHC: 33.2 g/dL (ref 30.0–36.0)
MCV: 93.8 fL (ref 78.0–100.0)
PLATELETS: 193 10*3/uL (ref 150–400)
RBC: 4.05 MIL/uL — AB (ref 4.22–5.81)
RDW: 13.4 % (ref 11.5–15.5)
WBC: 6 10*3/uL (ref 4.0–10.5)

## 2016-08-30 MED ORDER — OXYCODONE HCL 5 MG PO TABS
5.0000 mg | ORAL_TABLET | Freq: Four times a day (QID) | ORAL | Status: DC | PRN
  Administered 2016-08-30: 5 mg via ORAL
  Filled 2016-08-30: qty 1

## 2016-08-30 MED ORDER — PANCRELIPASE (LIP-PROT-AMYL) 12000-38000 UNITS PO CPEP: 12000.0000 [IU] | ORAL_CAPSULE | Freq: Three times a day (TID) | ORAL | 0 refills | Status: DC

## 2016-08-30 MED ORDER — SENNOSIDES-DOCUSATE SODIUM 8.6-50 MG PO TABS: 1.0000 | ORAL_TABLET | Freq: Every day | ORAL | 0 refills | Status: DC

## 2016-08-30 MED ORDER — HYDROMORPHONE HCL 1 MG/ML IJ SOLN: 1.0000 mg | INTRAMUSCULAR | Status: DC | PRN

## 2016-08-30 MED ORDER — OXYCODONE HCL 5 MG PO TABS: 5.0000 mg | ORAL_TABLET | Freq: Four times a day (QID) | ORAL | 0 refills | Status: DC | PRN

## 2016-08-30 NOTE — Discharge Instructions (Signed)
You were admitted to the hospital for acute pancreatitis and were given IV fluids and pain medications to manage your condition. Due to your nausea and vomiting, we had you on bowel rest and had the gastroenterology doctors see you.  Once your initial symptoms had subsided and you were able to eat, and your IV fluids were discontinued .  You were discharged home in stable condition.  Please see below for low-fat diet recommendations for your condition.       Low-Fat Diet for Pancreatitis or Gallbladder Conditions A low-fat diet can be helpful if you have pancreatitis or a gallbladder condition. With these conditions, your pancreas and gallbladder have trouble digesting fats. A healthy eating plan with less fat will help rest your pancreas and gallbladder and reduce your symptoms. What do I need to know about this diet?  Eat a low-fat diet.  Reduce your fat intake to less than 20-30% of your total daily calories. This is less than 50-60 g of fat per day.  Remember that you need some fat in your diet. Ask your dietician what your daily goal should be.  Choose nonfat and low-fat healthy foods. Look for the words nonfat, low fat, or fat free.  As a guide, look on the label and choose foods with less than 3 g of fat per serving. Eat only one serving.  Avoid alcohol.  Do not smoke. If you need help quitting, talk with your health care provider.  Eat small frequent meals instead of three large heavy meals. What foods can I eat? Grains  Include healthy grains and starches such as potatoes, wheat bread, fiber-rich cereal, and brown rice. Choose whole grain options whenever possible. In adults, whole grains should account for 45-65% of your daily calories. Fruits and Vegetables  Eat plenty of fruits and vegetables. Fresh fruits and vegetables add fiber to your diet. Meats and Other Protein Sources  Eat lean meat such as chicken and pork. Trim any fat off of meat before cooking it. Eggs, fish,  and beans are other sources of protein. In adults, these foods should account for 10-35% of your daily calories. Dairy  Choose low-fat milk and dairy options. Dairy includes fat and protein, as well as calcium. Fats and Oils  Limit high-fat foods such as fried foods, sweets, baked goods, sugary drinks. Other  Creamy sauces and condiments, such as mayonnaise, can add extra fat. Think about whether or not you need to use them, or use smaller amounts or low fat options. What foods are not recommended?  High fat foods, such as:  [employer].  Ice cream.  [place] toast.  Sweet rolls.  Pizza.  Cheese bread.   Foods covered with batter, butter, creamy sauces, or cheese.  Fried foods.  Sugary drinks and desserts.  Foods that cause gas or bloating This information is not intended to replace advice given to you by your health care provider. Make sure you discuss any questions you have with your health care provider. Document Released: 04/07/2013 Document Revised: 09/08/2015 Document Reviewed: 03/16/2013 Elsevier Interactive Patient Education  2017 [employer].

## 2016-08-30 NOTE — Progress Notes (Signed)
Subjective: Patient was seen and examined at bedside. He was able to tolerate low-fat diet yesterday evening and today morning without any nausea vomiting. He had a bowel movement today morning.  Objective: Vital signs in last 24 hours: Temp:  [98.5 F (36.9 C)-98.6 F (37 C)] 98.5 F (36.9 C) (05/17 0957) Pulse Rate:  [60-86] 72 (05/17 0957) Resp:  [18] 18 (05/17 0957) BP: (126-148)/(81-90) 128/87 (05/17 0957) SpO2:  [96 %-99 %] 98 % (05/17 0957) Weight:  [80.2 kg (176 lb 12.8 oz)] 80.2 kg (176 lb 12.8 oz) (05/16 2006) Weight change: 0.499 kg (1 lb 1.6 oz) Last BM Date: 08/29/16  PE: Not in acute distress, present cooperative GENERAL: Lying on bed, appears comfortable ABDOMEN: Soft, nondistended, nontender, normoactive bowel sounds EXTREMITIES: No edema, no deformity  Lab Results: Results for orders placed or performed during the hospital encounter of 08/25/16 (from the past 48 hour(s))  CBC     Status: Abnormal   Collection Time: 08/29/16  6:54 AM  Result Value Ref Range   WBC 8.3 4.0 - 10.5 K/uL   RBC 4.17 (L) 4.22 - 5.81 MIL/uL   Hemoglobin 13.1 13.0 - 17.0 g/dL   HCT 39.2 39.0 - 52.0 %   MCV 94.0 78.0 - 100.0 fL   MCH 31.4 26.0 - 34.0 pg   MCHC 33.4 30.0 - 36.0 g/dL   RDW 13.3 11.5 - 15.5 %   Platelets 202 150 - 400 K/uL  Basic metabolic panel     Status: Abnormal   Collection Time: 08/29/16  6:54 AM  Result Value Ref Range   Sodium 136 135 - 145 mmol/L   Potassium 3.3 (L) 3.5 - 5.1 mmol/L   Chloride 100 (L) 101 - 111 mmol/L   CO2 23 22 - 32 mmol/L   Glucose, Bld 84 65 - 99 mg/dL   BUN <5 (L) 6 - 20 mg/dL   Creatinine, Ser 1.04 0.61 - 1.24 mg/dL   Calcium 9.0 8.9 - 10.3 mg/dL   GFR calc non Af Amer >60 >60 mL/min   GFR calc Af Amer >60 >60 mL/min    Comment: (NOTE) The eGFR has been calculated using the CKD EPI equation. This calculation has not been validated in all clinical situations. eGFR's persistently <60 mL/min signify possible Chronic  Kidney Disease.    Anion gap 13 5 - 15  CBC     Status: Abnormal   Collection Time: 08/30/16  4:27 AM  Result Value Ref Range   WBC 6.0 4.0 - 10.5 K/uL   RBC 4.05 (L) 4.22 - 5.81 MIL/uL   Hemoglobin 12.6 (L) 13.0 - 17.0 g/dL   HCT 38.0 (L) 39.0 - 52.0 %   MCV 93.8 78.0 - 100.0 fL   MCH 31.1 26.0 - 34.0 pg   MCHC 33.2 30.0 - 36.0 g/dL   RDW 13.4 11.5 - 15.5 %   Platelets 193 150 - 400 K/uL  Basic metabolic panel     Status: Abnormal   Collection Time: 08/30/16  4:27 AM  Result Value Ref Range   Sodium 137 135 - 145 mmol/L   Potassium 4.2 3.5 - 5.1 mmol/L    Comment: DELTA CHECK NOTED   Chloride 103 101 - 111 mmol/L   CO2 25 22 - 32 mmol/L   Glucose, Bld 98 65 - 99 mg/dL   BUN 5 (L) 6 - 20 mg/dL   Creatinine, Ser 1.08 0.61 - 1.24 mg/dL   Calcium 9.1 8.9 - 10.3 mg/dL   GFR  calc non Af Amer >60 >60 mL/min   GFR calc Af Amer >60 >60 mL/min    Comment: (NOTE) The eGFR has been calculated using the CKD EPI equation. This calculation has not been validated in all clinical situations. eGFR's persistently <60 mL/min signify possible Chronic Kidney Disease.    Anion gap 9 5 - 15    Studies/Results: No results found.  Medications: I have reviewed the patient's current medications.  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 Continue Creon 12,000 units 3 times a day with each meal even as an outpatient. As an outpatient, he will benefit from a repeat MRI to be performed with contrast in 3-6 months to monitor changes in pancreas. If recurrence of pancreatitis is noted, he may benefit from referral to tertiary center for removal of pancreatic duct stone. Okay to discharge from GI standpoint.    Ronnette Juniper 08/30/2016, 11:49 AM   Pager [phone] If no answer or after 5 PM call [phone]

## 2016-08-30 NOTE — Progress Notes (Signed)
Transitions of Care Pharmacy Note  Plan:  Educated on Creon  Outpatient follow-up: efficacy of Creon  --------------------------------------------- Kyle Berry is an 46 y.o. male who presents with a chief complaint of pancreatitis. In anticipation of discharge, pharmacy has reviewed this patient's prior to admission medication history, as well as current inpatient medications listed per the [facility].  Current medication indications, dosing, frequency, and notable side effects reviewed with patient. patient verbalized understanding of current inpatient medication regimen and is aware that the After Visit Summary when presented, will represent the most accurate medication list at discharge.   Kyle Berry did not express any concerns regarding his medications. He is aware the Creon should be taken with meals. He did not have any additional questions at the conclusion of our visit.   Assessment: Understanding of regimen: good Understanding of indications: good Potential of compliance: good  Patient instructed to contact inpatient pharmacy team with further questions or concerns if needed.    Time spent preparing for discharge counseling: 10 min Time spent counseling patient: 10 min    Argie Ramming, PharmD Pharmacy Resident  Pager [phone] 08/30/16 1:46 PM

## 2016-08-30 NOTE — Progress Notes (Signed)
Patient Discharge: Disposition:  Patient discharged home.  Education: Reviewed medications, prescriptions and follow up instructions and diet instructions, understood and acknowledged. IV: Discontinued IV before discharge. Telemetry: N/A Transportation: Patient escorted out of the unit in w/c accompanied by girlfriend. Belongings: Patient took all his belongings with him.

## 2016-08-30 NOTE — Progress Notes (Signed)
Family Medicine Teaching Service Daily Progress Note Intern Pager: [phone]  Patient name: Kyle Berry Medical record number: [id number] Date of birth: 02/16/1971 Age: 46 y.o. Gender: male  Primary Care Provider: Medicine, [facility] Consultants: none Code Status: FULL   Pt Overview and Major Events to Date:  Admit 5/12   Assessment and Plan: Kyle Berry is a 46 y.o. male presenting with abdominal pain . PMH is significant for chronic pancreatitis, congenital single kidney, hypertension, high cholesterol, migraines and nephrolithiasis.    Acute on chronic pancreatitis: Pain has subsided.  Denies nausea, vomiting and has advanced diet to low-fat. Tolerating po.   Afebrile and VSS. If continues to do well can consider discharge later today  -D/C IVF  -Transition from IV dilaudid to po oxycodone  -Zofran 4 mg every 8 hours PRN for nausea  - Pepcid 20 mg BID  -Follow-up MRI abdomen with MRCP without and with IV contrast recommended in 3-6 months to document stability of the pancreatic head findings. -GI following, appreciate recs: Creon added,  continue pain control and zofran.  As outpatient may benefit from referral to [facility] or [facility] as outpatient for possible pancreatic stone removal.   -Will continue to monitor for improvement    Hypertension: Normotensive this AM.   -Monitor BPs   Hypokalemia:  This AM 4.2 after repletion 5/16.  -check CMP in the morning   FEN/GI:  -heart healthy, IVF NS @ 100cc/hour  -Pepcid  Prophylaxis: Lovenox   Disposition: Likely discharge this afternoon if continues to feel well and tolerate diet.   Subjective:  Patient reports he had a BM.  Feeling better this morning and is able to tolerate a low fat diet.  Denies nausea and vomiting.    Objective: Temp:  [98.5 F (36.9 C)-98.6 F (37 C)] 98.5 F (36.9 C) (05/17 0957) Pulse Rate:  [60-86] 72 (05/17 0957) Resp:  [18] 18 (05/17 0957) BP: (126-148)/(81-90) 128/87  (05/17 0957) SpO2:  [96 %-99 %] 98 % (05/17 0957) Weight:  [176 lb 12.8 oz (80.2 kg)] 176 lb 12.8 oz (80.2 kg) (05/16 2006)   Physical Exam: GEN: 46 yo M, sitting up in bed, eating breakfast  CVS: RRR, no MRG  RESP: CTAB, normal WOB  GI: soft, mild distention, mild TTP over epigastric region but much improved from previous, +bs  MSK: normal ROM  SKIN: warm, dry, no skin lesions noted  NEURO: AOx3, no focal deficits   Laboratory:  Recent Labs Lab 08/28/16 0540 08/29/16 0654 08/30/16 0427  WBC 6.6 8.3 6.0  HGB 13.6 13.1 12.6*  HCT 40.7 39.2 38.0*  PLT 178 202 193    Recent Labs Lab 08/25/16 1105 08/26/16 0439  08/28/16 0540 08/29/16 0654 08/30/16 0427  NA 138 138  < > 137 136 137  K 3.3* 3.6  < > 3.6 3.3* 4.2  CL 104 107  < > 101 100* 103  CO2 24 23  < > 27 23 25   BUN 9 6  < > <5* <5* 5*  CREATININE 1.27* 1.23  < > 1.01 1.04 1.08  CALCIUM 9.2 7.9*  < > 9.1 9.0 9.1  PROT 7.3 5.9*  --   --   --   --   BILITOT 1.2 1.1  --   --   --   --   ALKPHOS 50 41  --   --   --   --   ALT 24 18  --   --   --   --  AST 24 19  --   --   --   --   GLUCOSE 96 67  < > 96 84 98  < > = values in this interval not displayed. Imaging/Diagnostic Tests: No results found.  Lovenia Kim, MD 08/30/2016, 2:15 PM PGY-1, [place] Intern pager: [phone], text pages welcome

## 2016-09-03 NOTE — Discharge Summary (Signed)
[facility] Discharge Summary  Patient name: Kyle Berry Medical record number: [id number] Date of birth: [birth year] Age: 46 y.o. Gender: male Date of Admission: 08/25/2016  Date of Discharge: 08/30/2016 Admitting Physician: Dickie La, MD  Primary Care Provider: Medicine, [facility] Consultants: GI  Indication for Hospitalization: abdominal pain  Discharge Diagnoses/Problem List:  Acute on chronic pancreatitis Hypertension Hypokalemia  Disposition: Home  Discharge Condition: Stable, improved   Discharge Exam:  GEN: 46 yo M, sitting up in bed, eating breakfast  CVS: RRR, no MRG  RESP: CTAB, normal WOB  GI: soft, mild distention, mild TTP over epigastric region but much improved from previous, +bs  MSK: normal ROM  SKIN: warm, dry, no skin lesions noted  NEURO: AOx3, no focal deficits   Brief Hospital Course:  Kyle Berry is a 46 y.o. male presenting with abdominal pain . PMH is significant for chronic pancreatitis, congenital single kidney, hypertension, high cholesterol, migraines and nephrolithiasis.   On arrival to ED, patient with elevated lipase to 3000's and CT abdomen with acute pancreatitis without necrosis or collection, and short segment of pancreatic duct dilatation to 7 mm in the uncinate process containing a small stone.  CT findings were discussed with GI and evaluated.  MRCP was recommended. Patient also with hypokalemia on admission which resolved once repleted.  Received 2L fluids, dilaudid for pain control and zofran in the ED.  He was admitted to [facility] for further management of his pancreatitis and kept NPO.  IVFs were continued as he was not tolerating po in setting of nausea and vomiting.  MRCP revealed acute on chronic pancreatitis with focally dilated portion of the dorsal pancreatic duct in the pancreatic head.  No discrete pancreatic masses or peripancreatic fluid collections.  He was advanced to a full  liquid diet as tolerating however had a few setbacks with vomiting.  He was changed back to clear liquids and kept on IVFs during this time and had some worsening abdominal pain.  GI was reconsulted and recommended adding Creon pancreatic supplementation.  He was advanced as tolerating to a full liquid diet and monitored overnight for pain control.  His pain improved and he was transitioned to po oxycodone and IVFs were weaned once he had good po intake.  He was feeling much better at this time and back at his baseline.  He was discharged home in stable condition and with follow up outpatient with PCP.    Issues for Follow Up:  1.  Follow-up MRI abdomen with MRCP with and without IV contrast recommended in 3-6 months to document stability of the pancreatic head findings. 2.  Per GI recs, continue Creon supplementation 12,000 units TID with each meal as outpatient.  3.  Patient advised to refrain from high fat containing food and alcohol.   4.  Blood pressure.  Hypertensive on admission, likely in setting of pain.  Not on any blood pressure medication at home.  Monitor outpatient.  5. BMET for K+.   6.  If recurrence, may benefit from referral to [facility] or [facility] as outpatient for possible pancreatic stone removal.    Significant Procedures: None  Significant Labs and Imaging:   Recent Labs Lab 08/28/16 0540 08/29/16 0654 08/30/16 0427  WBC 6.6 8.3 6.0  HGB 13.6 13.1 12.6*  HCT 40.7 39.2 38.0*  PLT 178 202 193    Recent Labs Lab 08/28/16 0540 08/29/16 0654 08/30/16 0427  NA 137 136 137  K 3.6  3.3* 4.2  CL 101 100* 103  CO2 27 23 25   GLUCOSE 96 84 98  BUN <5* <5* 5*  CREATININE 1.01 1.04 1.08  CALCIUM 9.1 9.0 9.1    Results/Tests Pending at Time of Discharge: None  Discharge Medications:  Allergies as of 08/30/2016      Reactions   Diclofenac Hives   Norvasc [amlodipine Besylate] Other (See Comments)   Unknown   Omeprazole Other (See Comments)   Unknown   Prednisone Other  (See Comments)   Mood swings      Medication List    TAKE these medications   acetaminophen 500 MG tablet Commonly known as:  TYLENOL Take 1,000 mg by mouth every 6 (six) hours as needed for headache.   ibuprofen 200 MG tablet Commonly known as:  ADVIL,MOTRIN Take 400 mg by mouth every 6 (six) hours as needed for headache or moderate pain.   lipase/protease/amylase 12000 units Cpep capsule Commonly known as:  CREON Take 1 capsule (12,000 Units total) by mouth 3 (three) times daily with meals.   omeprazole 20 MG capsule Commonly known as:  PRILOSEC Take 20 mg by mouth daily as needed (for heart burn).   ondansetron 4 MG disintegrating tablet Commonly known as:  ZOFRAN ODT Take 1 tablet (4 mg total) by mouth every 8 (eight) hours as needed for nausea or vomiting.   oxyCODONE 5 MG immediate release tablet Commonly known as:  Oxy IR/ROXICODONE Take 1 tablet (5 mg total) by mouth every 6 (six) hours as needed for moderate pain or severe pain.   oxyCODONE-acetaminophen 5-325 MG tablet Commonly known as:  PERCOCET/ROXICET Take 1 tablet by mouth every 6 (six) hours as needed for severe pain.   senna-docusate 8.6-50 MG tablet Commonly known as:  Senokot-S Take 1 tablet by mouth daily.      Discharge Instructions: Please refer to Patient Instructions section of EMR for full details.  Patient was counseled important signs and symptoms that should prompt return to medical care, changes in medications, dietary instructions, activity restrictions, and follow up appointments.   Follow-Up Appointments: Follow-up Information    Medicine, [facility] Follow up in 1 week(s).   Contact information: [place] [address] [phone]          Lovenia Kim, MD 09/03/2016, 8:51 PM PGY-1, [place]

## 2016-11-06 ENCOUNTER — Encounter (HOSPITAL_COMMUNITY): Payer: Self-pay

## 2016-11-06 ENCOUNTER — Inpatient Hospital Stay (HOSPITAL_COMMUNITY): Payer: Self-pay

## 2016-11-06 ENCOUNTER — Inpatient Hospital Stay (HOSPITAL_COMMUNITY)
Admission: EM | Admit: 2016-11-06 | Discharge: 2016-11-09 | DRG: 439 | Disposition: A | Discharge status: Home / Self Care | Payer: Self-pay | Attending: Family Medicine | Admitting: Family Medicine

## 2016-11-06 ENCOUNTER — Emergency Department (HOSPITAL_COMMUNITY): Payer: Self-pay

## 2016-11-06 DIAGNOSIS — K861 Other chronic pancreatitis: Secondary | ICD-10-CM | POA: Diagnosis present

## 2016-11-06 DIAGNOSIS — E785 Hyperlipidemia, unspecified: Secondary | ICD-10-CM | POA: Diagnosis present

## 2016-11-06 DIAGNOSIS — Q600 Renal agenesis, unilateral: Secondary | ICD-10-CM

## 2016-11-06 DIAGNOSIS — Z87442 Personal history of urinary calculi: Secondary | ICD-10-CM

## 2016-11-06 DIAGNOSIS — K852 Alcohol induced acute pancreatitis without necrosis or infection: Principal | ICD-10-CM | POA: Diagnosis present

## 2016-11-06 DIAGNOSIS — M545 Low back pain: Secondary | ICD-10-CM | POA: Diagnosis present

## 2016-11-06 DIAGNOSIS — I10 Essential (primary) hypertension: Secondary | ICD-10-CM | POA: Diagnosis present

## 2016-11-06 DIAGNOSIS — F1011 Alcohol abuse, in remission: Secondary | ICD-10-CM | POA: Insufficient documentation

## 2016-11-06 DIAGNOSIS — K859 Acute pancreatitis without necrosis or infection, unspecified: Secondary | ICD-10-CM

## 2016-11-06 DIAGNOSIS — K860 Alcohol-induced chronic pancreatitis: Secondary | ICD-10-CM | POA: Diagnosis present

## 2016-11-06 DIAGNOSIS — Z87898 Personal history of other specified conditions: Secondary | ICD-10-CM

## 2016-11-06 DIAGNOSIS — G43909 Migraine, unspecified, not intractable, without status migrainosus: Secondary | ICD-10-CM | POA: Diagnosis present

## 2016-11-06 DIAGNOSIS — G8929 Other chronic pain: Secondary | ICD-10-CM | POA: Diagnosis present

## 2016-11-06 DIAGNOSIS — K219 Gastro-esophageal reflux disease without esophagitis: Secondary | ICD-10-CM | POA: Diagnosis present

## 2016-11-06 DIAGNOSIS — Z79899 Other long term (current) drug therapy: Secondary | ICD-10-CM

## 2016-11-06 LAB — COMPREHENSIVE METABOLIC PANEL
ALK PHOS: 62 U/L (ref 38–126)
ALT: 27 U/L (ref 17–63)
AST: 43 U/L — AB (ref 15–41)
Albumin: 3.9 g/dL (ref 3.5–5.0)
Anion gap: 10 (ref 5–15)
BILIRUBIN TOTAL: 1.4 mg/dL — AB (ref 0.3–1.2)
BUN: 8 mg/dL (ref 6–20)
CALCIUM: 9.1 mg/dL (ref 8.9–10.3)
CHLORIDE: 106 mmol/L (ref 101–111)
CO2: 22 mmol/L (ref 22–32)
CREATININE: 1.2 mg/dL (ref 0.61–1.24)
Glucose, Bld: 110 mg/dL — ABNORMAL HIGH (ref 65–99)
Potassium: 4.1 mmol/L (ref 3.5–5.1)
Sodium: 138 mmol/L (ref 135–145)
TOTAL PROTEIN: 6.7 g/dL (ref 6.5–8.1)

## 2016-11-06 LAB — URINALYSIS, ROUTINE W REFLEX MICROSCOPIC
Bacteria, UA: NONE SEEN
Bilirubin Urine: NEGATIVE
GLUCOSE, UA: NEGATIVE mg/dL
Ketones, ur: NEGATIVE mg/dL
Leukocytes, UA: NEGATIVE
Nitrite: NEGATIVE
PROTEIN: NEGATIVE mg/dL
SPECIFIC GRAVITY, URINE: 1.012 (ref 1.005–1.030)
pH: 5 (ref 5.0–8.0)

## 2016-11-06 LAB — CBC WITH DIFFERENTIAL/PLATELET
BASOS ABS: 0 10*3/uL (ref 0.0–0.1)
Basophils Relative: 0 %
Eosinophils Absolute: 0.1 10*3/uL (ref 0.0–0.7)
Eosinophils Relative: 1 %
HEMATOCRIT: 42.6 % (ref 39.0–52.0)
Hemoglobin: 14.6 g/dL (ref 13.0–17.0)
LYMPHS ABS: 2.3 10*3/uL (ref 0.7–4.0)
LYMPHS PCT: 26 %
MCH: 31.5 pg (ref 26.0–34.0)
MCHC: 34.3 g/dL (ref 30.0–36.0)
MCV: 91.8 fL (ref 78.0–100.0)
Monocytes Absolute: 0.3 10*3/uL (ref 0.1–1.0)
Monocytes Relative: 3 %
NEUTROS ABS: 6.4 10*3/uL (ref 1.7–7.7)
Neutrophils Relative %: 70 %
Platelets: 184 10*3/uL (ref 150–400)
RBC: 4.64 MIL/uL (ref 4.22–5.81)
RDW: 13.8 % (ref 11.5–15.5)
WBC: 9.1 10*3/uL (ref 4.0–10.5)

## 2016-11-06 LAB — LIPASE, BLOOD: LIPASE: 208 U/L — AB (ref 11–51)

## 2016-11-06 MED ORDER — MORPHINE SULFATE (PF) 4 MG/ML IV SOLN
4.0000 mg | Freq: Once | INTRAVENOUS | Status: AC
  Administered 2016-11-06: 4 mg via INTRAVENOUS
  Filled 2016-11-06: qty 1

## 2016-11-06 MED ORDER — ONDANSETRON HCL 4 MG/2ML IJ SOLN
4.0000 mg | Freq: Once | INTRAMUSCULAR | Status: AC
  Administered 2016-11-06: 4 mg via INTRAVENOUS
  Filled 2016-11-06: qty 2

## 2016-11-06 MED ORDER — ONDANSETRON HCL 4 MG/2ML IJ SOLN
4.0000 mg | Freq: Four times a day (QID) | INTRAMUSCULAR | Status: DC | PRN
  Administered 2016-11-06: 4 mg via INTRAVENOUS
  Filled 2016-11-06: qty 2

## 2016-11-06 MED ORDER — FAMOTIDINE IN NACL 20-0.9 MG/50ML-% IV SOLN
20.0000 mg | Freq: Two times a day (BID) | INTRAVENOUS | Status: DC
  Administered 2016-11-06 – 2016-11-08 (×5): 20 mg via INTRAVENOUS
  Filled 2016-11-06 (×5): qty 50

## 2016-11-06 MED ORDER — HYDROMORPHONE HCL 1 MG/ML IJ SOLN
1.0000 mg | Freq: Once | INTRAMUSCULAR | Status: AC
  Administered 2016-11-06: 1 mg via INTRAVENOUS
  Filled 2016-11-06: qty 1

## 2016-11-06 MED ORDER — SODIUM CHLORIDE 0.9 % IV BOLUS (SEPSIS)
1000.0000 mL | Freq: Once | INTRAVENOUS | Status: AC
  Administered 2016-11-06: 1000 mL via INTRAVENOUS

## 2016-11-06 MED ORDER — LORAZEPAM 1 MG PO TABS: 1.0000 mg | ORAL_TABLET | Freq: Once | ORAL | Status: DC | PRN

## 2016-11-06 MED ORDER — PANCRELIPASE (LIP-PROT-AMYL) 12000-38000 UNITS PO CPEP
12000.0000 [IU] | ORAL_CAPSULE | Freq: Three times a day (TID) | ORAL | Status: DC
  Administered 2016-11-07: 12000 [IU] via ORAL
  Filled 2016-11-06 (×2): qty 1

## 2016-11-06 MED ORDER — IOPAMIDOL (ISOVUE-300) INJECTION 61%
INTRAVENOUS | Status: AC
  Administered 2016-11-06: 75 mL via INTRAVENOUS
  Filled 2016-11-06: qty 100

## 2016-11-06 MED ORDER — ENOXAPARIN SODIUM 40 MG/0.4ML ~~LOC~~ SOLN
40.0000 mg | SUBCUTANEOUS | Status: DC
  Administered 2016-11-06 – 2016-11-08 (×3): 40 mg via SUBCUTANEOUS
  Filled 2016-11-06 (×4): qty 0.4

## 2016-11-06 MED ORDER — SODIUM CHLORIDE 0.9 % IV SOLN
INTRAVENOUS | Status: DC
  Administered 2016-11-06 – 2016-11-07 (×4): via INTRAVENOUS

## 2016-11-06 MED ORDER — ONDANSETRON HCL 4 MG PO TABS: 4.0000 mg | ORAL_TABLET | Freq: Four times a day (QID) | ORAL | Status: DC | PRN

## 2016-11-06 MED ORDER — SENNOSIDES-DOCUSATE SODIUM 8.6-50 MG PO TABS
1.0000 | ORAL_TABLET | Freq: Every evening | ORAL | Status: DC | PRN
  Filled 2016-11-06: qty 1

## 2016-11-06 MED ORDER — HYDROMORPHONE HCL 1 MG/ML IJ SOLN
1.0000 mg | INTRAMUSCULAR | Status: DC | PRN
Start: 2016-11-06 — End: 2016-11-07
  Administered 2016-11-06 – 2016-11-07 (×5): 1 mg via INTRAVENOUS
  Filled 2016-11-06 (×5): qty 1

## 2016-11-06 MED ORDER — PROMETHAZINE HCL 25 MG/ML IJ SOLN
12.5000 mg | Freq: Four times a day (QID) | INTRAMUSCULAR | Status: DC | PRN
  Administered 2016-11-06 – 2016-11-07 (×2): 12.5 mg via INTRAVENOUS
  Filled 2016-11-06 (×2): qty 1

## 2016-11-06 MED ORDER — MORPHINE SULFATE (PF) 4 MG/ML IV SOLN
2.0000 mg | INTRAVENOUS | Status: DC | PRN
  Administered 2016-11-06: 2 mg via INTRAVENOUS
  Filled 2016-11-06: qty 1

## 2016-11-06 MED ORDER — GADOBENATE DIMEGLUMINE 529 MG/ML IV SOLN
17.0000 mL | Freq: Once | INTRAVENOUS | Status: AC | PRN
  Administered 2016-11-06: 17 mL via INTRAVENOUS

## 2016-11-06 NOTE — H&P (Signed)
[facility] Admission History and Physical Service Pager: [phone]  Patient name: Kyle Berry Medical record number: [id number] Date of birth: 03-26-1971 Age: 46 y.o. Gender: male  Primary Care Provider: Medicine, [facility] Consultants: None  Code Status: FULL   Chief Complaint: Abdominal Pain   Assessment and Plan: Kyle Berry is a 45 y.o. male presenting with abdominal pain . PMH is significant for chronic pancreatitis, congenital single kidney, HTN, hyperlipidemia, migraines and nephrolithiasis.   Acute Pancreatitis: History of chronic pancreatitis. Was admitted in May 2018 with acute on chronic pancreatitis. GI consulted and  MRCP revealed acute on chronic pancreatitis with focally dilated portion of the dorsal pancreatic duct in the pancreatic head. Creon supplementation added per GI and patient was advised to have follow up MRI abdomen with MRCP to document stability of pancreatic head findings. Today, patient presents with epigastric abdominal pain, nausea, lipase elevated to 208, and CT abdomen demonstrating edema surrounding the pancreas compatible with acute pancreatitis. Patient has a history of heavy alcohol use but reports last drink was several months ago. CT abdomen did not show any changes with gallbladder and has recent triglyceride value of 99 (5/18). Patient is afebrile, with stable vital signs, and without leukocytosis.  -Admit to MedSurg, vital signs per unit  -consider touching base with GI as patient reports has been unable to establish outpatient GI follow up  -s/p 2L IVFs in ED; NS at 200 cc/hr, adjust IVFs as needed  -NPO for now; advance diet slowly once pain better controlled   -morphine 2 mg q3h prn  -zofran q6h prn for nausea/vomiting  -Pepcid 20 mg IV BID  -re-start Creon as prescribed by GI at recent admission when taking PO  -ethanol level   -CBC and CMP in AM  -CM consult for medication assistance  with Creon   Hypertension: Patient with history of HTN but not on any medications. Reports was previously on Amlodipine but this was discontinued due to edema. Has not been on any other anti-hypertensive agents recently. BP elevated at initial presentation (161/99) likely secondary to pain but improved to 130-140/80-90.  -continue to monitor  -can add prn BP medication as needed   History of Alcohol Abuse: Patient reports he was recently a heavy drinker but quit several months ago. No history of alcohol withdrawal such as shakiness, hallucinations, or seizures.  -monitor CIWA scoring q4h   FEN/GI: NPO, NS at 200 cc/hr, Pepcid  Prophylaxis: Lovenox   Disposition: Admit to [place], Attending Dr. Andria Frames, Home pending improvement in pancreatitis symptoms   History of Present Illness:  Kyle Berry is a 46 y.o. male presenting with abdominal pain for 10 hours. Pain started at about 1 am this morning and has been gradually getting worse, prompting him to come to the ED. Pain located in epigastric region. Does not radiate. Described as aching pain. He endorses nausea. Vomited on Sunday prior to the initiation of pain. Has been afebrile at home. Nothing PO today. Last ate yesterday evening.   Reports that he has a history of chronic pancreatitis. He has not seen GI outpatient since admission for pancreatitis two months ago. He has not taken Creon as prescribed due to inability to afford medication. Reports that he was previously a heavy drinker but has not had a drink for several months.   In the ED, vital signs normal except for mildly elevated BP. Lipase elevated to 208. AST and bilirubin minimally elevated; otherwise CMP unremarkable. CBC unremarkable.  Urine negative for infectious process. CT abdomen showed edema surrounding the pancrease compatible with acute pancreatitis. Probable changes of chronic pancreatitis seen in the pancreatic head. He received 2L NS and Morphine 4 mg x2. Zofran was  also given.   Review Of Systems: Per HPI with the following additions:   Review of Systems  Constitutional: Negative for chills and fever.  Respiratory: Negative for cough and shortness of breath.   Cardiovascular: Negative for chest pain.  Gastrointestinal: Positive for abdominal pain and nausea. Negative for constipation, diarrhea and vomiting.  Genitourinary: Negative for dysuria.  Musculoskeletal: Negative for myalgias.  Skin: Negative for rash.  Neurological: Negative for dizziness and loss of consciousness.    Patient Active Problem List   Diagnosis Date Noted  . Hypokalemia   . Non-intractable vomiting with nausea   . Alcohol abuse   . Essential hypertension   . Esophageal reflux   . Pancreatitis 11/23/2015    Past Medical History: Past Medical History:  Diagnosis Date  . Acute pancreatitis 11/23/2015  . Chronic bronchitis ([place])   . Chronic lower back pain   . Congenital single kidney   . GERD (gastroesophageal reflux disease)   . High cholesterol   . Hypertension   . Kidney calculi   . Migraines    "once/month" (11/23/2015)  . Nephrolithiasis   . Pneumonia ~ 09/2015  . Renal disorder     Past Surgical History: Past Surgical History:  Procedure Laterality Date  . NO PAST SURGERIES      Social History: Social History  Substance Use Topics  . Smoking status: Never Smoker  . Smokeless tobacco: Never Used  . Alcohol use No     Comment: last drink months ago   Additional social history: No drug use.   Please also refer to relevant sections of EMR.  Family History: Family History  Problem Relation Age of Onset  . Hypertension Other   . Hypertension Mother   . Hypertension Father   . Kidney disease Father   . Hypertension Sister   . Diabetes Sister   . Hypertension Brother    Allergies and Medications: Allergies  Allergen Reactions  . Diclofenac Hives  . Norvasc [Amlodipine Besylate] Other (See Comments)    Unknown  . Omeprazole Other (See  Comments)    Unknown  . Prednisone Other (See Comments)    Mood swings   No current facility-administered medications on file prior to encounter.    Current Outpatient Prescriptions on File Prior to Encounter  Medication Sig Dispense Refill  . ibuprofen (ADVIL,MOTRIN) 200 MG tablet Take 400 mg by mouth every 6 (six) hours as needed for headache or moderate pain.    Marland Kitchen omeprazole (PRILOSEC) 20 MG capsule Take 20 mg by mouth daily as needed (for heart burn).     Marland Kitchen oxyCODONE-acetaminophen (PERCOCET/ROXICET) 5-325 MG tablet Take 1 tablet by mouth every 6 (six) hours as needed for severe pain. 10 tablet 0  . senna-docusate (SENOKOT-S) 8.6-50 MG tablet Take 1 tablet by mouth daily. 5 tablet 0  . lipase/protease/amylase (CREON) 12000 units CPEP capsule Take 1 capsule (12,000 Units total) by mouth 3 (three) times daily with meals. (Patient not taking: Reported on 11/06/2016) 270 capsule 0  . ondansetron (ZOFRAN ODT) 4 MG disintegrating tablet Take 1 tablet (4 mg total) by mouth every 8 (eight) hours as needed for nausea or vomiting. (Patient not taking: Reported on 11/06/2016) 20 tablet 0  . oxyCODONE (OXY IR/ROXICODONE) 5 MG immediate release tablet Take 1 tablet (  5 mg total) by mouth every 6 (six) hours as needed for moderate pain or severe pain. (Patient not taking: Reported on 11/06/2016) 10 tablet 0  . [DISCONTINUED] Ibuprofen-Diphenhydramine HCl (ADVIL PM) 200-25 MG CAPS Take 1 tablet by mouth at bedtime as needed (sleep).      Objective: BP (!) 142/92   Pulse 71   Temp 98.2 F (36.8 C) (Oral)   Resp 18   Ht 6\' 1"  (1.854 m)   Wt 190 lb (86.2 kg)   SpO2 96%   BMI 25.07 kg/m  Exam: General: male lying in bed, non-toxic appearing in NAD Eyes: sclera anicteric, EOMI, PERRL  ENTM: MMM. Oropharynx clear.  Neck: Full ROM. Supple.  Cardiovascular: RRR. No murmurs appreciated. No edema.  Respiratory: CTAB. Normal WOB.  Gastrointestinal:  MSK: +BS, soft, TTP in epigastric region, no guarding or  rebound  Derm: No rashes noted.  Neuro: Alert and oriented. No gross deficits.  Psych: Appropriate mood and affect.   Labs and Imaging: CBC BMET   Recent Labs Lab 11/06/16 0730  WBC 9.1  HGB 14.6  HCT 42.6  PLT 184    Recent Labs Lab 11/06/16 0730  NA 138  K 4.1  CL 106  CO2 22  BUN 8  CREATININE 1.20  GLUCOSE 110*  CALCIUM 9.1     Ct Abdomen Pelvis W Contrast  Result Date: 11/06/2016 CLINICAL DATA:  Epigastric abdominal pain.  History of pancreatitis. EXAM: CT ABDOMEN AND PELVIS WITH CONTRAST TECHNIQUE: Multidetector CT imaging of the abdomen and pelvis was performed using the standard protocol following bolus administration of intravenous contrast. CONTRAST:  ISOVUE-300 IOPAMIDOL (ISOVUE-300) INJECTION 61% COMPARISON:  08/25/2016 FINDINGS: Lower chest: Left base atelectasis or scarring. Heart is normal size. No effusions. Hepatobiliary: No focal hepatic abnormality. Gallbladder unremarkable. Pancreas: Edema/stranding noted around the pancreas compatible with acute pancreatitis. Small calcification in the pancreatic head and prominence of the pancreatic duct, likely related to chronic pancreatitis. No focal pancreatic abnormality or evidence of necrosis. Spleen: No focal abnormality.  Normal size. Adrenals/Urinary Tract: Right kidney is severely atrophic, stable. Left kidney unremarkable. Adrenal glands and urinary bladder unremarkable. Stomach/Bowel: Appendix is normal. Stomach, large and small bowel grossly unremarkable. Vascular/Lymphatic: No evidence of aneurysm or adenopathy. Reproductive: No visible focal abnormality. Other: No free fluid or free air. Musculoskeletal: No acute bony abnormality. IMPRESSION: Edema surrounding the pancreas compatible with acute pancreatitis. Probable changes of chronic pancreatitis in the pancreatic head. Severely atrophic right kidney. Electronically Signed   By: Rolm Baptise M.D.   On: 11/06/2016 10:04    Nicolette Bang,  DO 11/06/2016, 10:46 AM PGY-3, [place] [facility] pager: [phone], text pages welcome

## 2016-11-06 NOTE — ED Notes (Signed)
Patient transported to CT 

## 2016-11-06 NOTE — ED Provider Notes (Signed)
[facility] DEPT Provider Note   CSN: [id number] Arrival date & time: 11/06/16  0701     History   Chief Complaint Chief Complaint  Patient presents with  . Abdominal Pain  . Nausea    HPI Kyle Berry is a 46 y.o. male is here for evaluation of constant upper abdominal pain x a few hours associated with nausea and NBNB emesis x 2-3 days. Denies recent ETOH, stopped drinking 6 months due to recurrent pancreatitis. Used to drink 2-3 beers a day. Has changed his diet, less fatty/greasy foods and more veggies and fruits. Unable to afford lipase as prescribed after last hospital discharge. Denies diarrhea, melena, hematochezia. No exposure to suspicious foods or sick contacts. Does not have GI provider.   HPI  Past Medical History:  Diagnosis Date  . Acute pancreatitis 11/23/2015  . Chronic bronchitis ([place])   . Chronic lower back pain   . Congenital single kidney   . GERD (gastroesophageal reflux disease)   . High cholesterol   . Hypertension   . Kidney calculi   . Migraines    "once/month" (11/23/2015)  . Nephrolithiasis   . Pneumonia ~ 09/2015  . Renal disorder     Patient Active Problem List   Diagnosis Date Noted  . Hypokalemia   . Non-intractable vomiting with nausea   . Alcohol abuse   . Essential hypertension   . Esophageal reflux   . Pancreatitis 11/23/2015    Past Surgical History:  Procedure Laterality Date  . NO PAST SURGERIES         Home Medications    Prior to Admission medications   Medication Sig Start Date End Date Taking? Authorizing Provider  acetaminophen (TYLENOL) 500 MG tablet Take 1,000 mg by mouth every 6 (six) hours as needed for headache.    [provider]  ibuprofen (ADVIL,MOTRIN) 200 MG tablet Take 400 mg by mouth every 6 (six) hours as needed for headache or moderate pain.    [provider]  lipase/protease/amylase (CREON) 12000 units CPEP capsule Take 1 capsule (12,000 Units total) by mouth 3 (three)  times daily with meals. 08/30/16   Lovenia Kim, MD  omeprazole (PRILOSEC) 20 MG capsule Take 20 mg by mouth daily as needed (for heart burn).     [provider]  ondansetron (ZOFRAN ODT) 4 MG disintegrating tablet Take 1 tablet (4 mg total) by mouth every 8 (eight) hours as needed for nausea or vomiting. 08/15/16   Shary Decamp, PA-C  oxyCODONE (OXY IR/ROXICODONE) 5 MG immediate release tablet Take 1 tablet (5 mg total) by mouth every 6 (six) hours as needed for moderate pain or severe pain. 08/30/16   Lovenia Kim, MD  oxyCODONE-acetaminophen (PERCOCET/ROXICET) 5-325 MG tablet Take 1 tablet by mouth every 6 (six) hours as needed for severe pain. 08/15/16   Shary Decamp, PA-C  senna-docusate (SENOKOT-S) 8.6-50 MG tablet Take 1 tablet by mouth daily. 08/31/16   Lovenia Kim, MD    Family History Family History  Problem Relation Age of Onset  . Hypertension Other   . Hypertension Mother   . Hypertension Father   . Kidney disease Father   . Hypertension Sister   . Diabetes Sister   . Hypertension Brother     Social History Social History  Substance Use Topics  . Smoking status: Never Smoker  . Smokeless tobacco: Never Used  . Alcohol use No     Comment: last drink months ago     Allergies   Diclofenac;  Norvasc [amlodipine besylate]; Omeprazole; and Prednisone   Review of Systems Review of Systems  Constitutional: Positive for chills and diaphoresis. Negative for fever.  HENT: Negative for congestion and sore throat.   Respiratory: Negative for cough, chest tightness and shortness of breath.   Cardiovascular: Negative for chest pain.  Gastrointestinal: Positive for abdominal pain, nausea and vomiting. Negative for constipation and diarrhea.  Genitourinary: Negative for difficulty urinating, dysuria and hematuria.  Musculoskeletal: Negative for back pain.  Skin: Negative for rash.  Neurological: Negative for headaches.     Physical Exam Updated Vital Signs BP (!)  134/92   Pulse 74   Temp 98.2 F (36.8 C) (Oral)   Resp 18   Ht 6\' 1"  (1.854 m)   Wt 86.2 kg (190 lb)   SpO2 96%   BMI 25.07 kg/m   Physical Exam  Constitutional: He is oriented to person, place, and time. He appears well-developed and well-nourished. No distress.  Appears uncomfortable  HENT:  Head: Normocephalic and atraumatic.  Nose: Nose normal.  Mouth/Throat: No oropharyngeal exudate.  Moist mucous membranes   Eyes: Conjunctivae and EOM are normal.  Neck: Normal range of motion. Neck supple.  Cardiovascular: Normal rate, regular rhythm, normal heart sounds and intact distal pulses.   No murmur heard. Pulmonary/Chest: Effort normal and breath sounds normal. No respiratory distress. He has no wheezes. He has no rales.  Abdominal: Soft. Bowel sounds are normal. He exhibits no distension and no mass. There is tenderness. There is no guarding.  Epigastric and RUQ abdominal tenderness No suprapubic or CVA tenderness  Musculoskeletal: Normal range of motion. He exhibits no deformity.  Lymphadenopathy:    He has no cervical adenopathy.  Neurological: He is alert and oriented to person, place, and time.  Skin: Skin is warm and dry. Capillary refill takes less than 2 seconds.  Psychiatric: He has a normal mood and affect. His behavior is normal. Judgment and thought content normal.  Nursing note and vitals reviewed.    ED Treatments / Results  Labs (all labs ordered are listed, but only abnormal results are displayed) Labs Reviewed  COMPREHENSIVE METABOLIC PANEL - Abnormal; Notable for the following:       Result Value   Glucose, Bld 110 (*)    AST 43 (*)    Total Bilirubin 1.4 (*)    All other components within normal limits  LIPASE, BLOOD - Abnormal; Notable for the following:    Lipase 208 (*)    All other components within normal limits  URINALYSIS, ROUTINE W REFLEX MICROSCOPIC - Abnormal; Notable for the following:    Hgb urine dipstick SMALL (*)    Squamous  Epithelial / LPF 0-5 (*)    All other components within normal limits  CBC WITH DIFFERENTIAL/PLATELET    EKG  EKG Interpretation None       Radiology Ct Abdomen Pelvis W Contrast  Result Date: 11/06/2016 CLINICAL DATA:  Epigastric abdominal pain.  History of pancreatitis. EXAM: CT ABDOMEN AND PELVIS WITH CONTRAST TECHNIQUE: Multidetector CT imaging of the abdomen and pelvis was performed using the standard protocol following bolus administration of intravenous contrast. CONTRAST:  ISOVUE-300 IOPAMIDOL (ISOVUE-300) INJECTION 61% COMPARISON:  08/25/2016 FINDINGS: Lower chest: Left base atelectasis or scarring. Heart is normal size. No effusions. Hepatobiliary: No focal hepatic abnormality. Gallbladder unremarkable. Pancreas: Edema/stranding noted around the pancreas compatible with acute pancreatitis. Small calcification in the pancreatic head and prominence of the pancreatic duct, likely related to chronic pancreatitis. No focal pancreatic abnormality or  evidence of necrosis. Spleen: No focal abnormality.  Normal size. Adrenals/Urinary Tract: Right kidney is severely atrophic, stable. Left kidney unremarkable. Adrenal glands and urinary bladder unremarkable. Stomach/Bowel: Appendix is normal. Stomach, large and small bowel grossly unremarkable. Vascular/Lymphatic: No evidence of aneurysm or adenopathy. Reproductive: No visible focal abnormality. Other: No free fluid or free air. Musculoskeletal: No acute bony abnormality. IMPRESSION: Edema surrounding the pancreas compatible with acute pancreatitis. Probable changes of chronic pancreatitis in the pancreatic head. Severely atrophic right kidney. Electronically Signed   By: Rolm Baptise M.D.   On: 11/06/2016 10:04    Procedures Procedures (including critical care time)  Medications Ordered in ED Medications  morphine 4 MG/ML injection 4 mg (not administered)  ondansetron (ZOFRAN) injection 4 mg (not administered)  sodium chloride 0.9 % bolus  1,000 mL (not administered)  sodium chloride 0.9 % bolus 1,000 mL (not administered)  morphine 4 MG/ML injection 4 mg (4 mg Intravenous Given 11/06/16 0748)  ondansetron (ZOFRAN) injection 4 mg (4 mg Intravenous Given 11/06/16 0748)  iopamidol (ISOVUE-300) 61 % injection (75 mLs Intravenous Contrast Given 11/06/16 0938)     Initial Impression / Assessment and Plan / ED Course  I have reviewed the triage vital signs and the nursing notes.  Pertinent labs & imaging results that were available during my care of the patient were reviewed by me and considered in my medical decision making (see chart for details).  Clinical Course as of Nov 07 1018  Tue Nov 06, 2016  0828 Lipase: (!) 208 [CG]  0835 Glucose: (!) 110 [CG]  0835 AST: (!) 43 [CG]  0835 ALT: 27 [CG]  0835 Alkaline Phosphatase: 62 [CG]  0835 WBC: 9.1 [CG]  1011 IMPRESSION: Edema surrounding the pancreas compatible with acute pancreatitis.Probable changes of chronic pancreatitis in the pancreatic head. CT Abdomen Pelvis W Contrast [CG]    Clinical Course User Index [CG] Kinnie Feil, PA-C   46 year old male with history of pancreatitis presents to the ED with nausea, vomiting, epigastric abdominal pain. States last time he had alcohol was 6 months ago, however he was recently admitted for alcoholic induced pancreatitis. Denies diarrhea, fevers, chest pain, shortness of breath.  No peritoneal signs on exam. He is hemodynamically stable. MRCP done at last admission showed acute on chronic pancreatitis with dilated portion of dorsal pancreatic duct. GI advised adding lipase supp, which he has not been able to afford. He was recommended repeat MRI which he was unable  to get done due to finances.   Lipase 208. LFTs within normal limits. Glucose within normal limits. No leukocytosis. CT abdomen and pelvis shows edema around pancreas consistent with acute pancreatitis, no evidence of necrosis.  Patient has received morphine 2, Zofran  2 and 2 L IV fluids in the ED with adequate control of his symptoms. Nothing by mouth. We'll request admission. Patient does not have a GI provider.  Final Clinical Impressions(s) / ED Diagnoses   Final diagnoses:  Recurrent acute pancreatitis    New Prescriptions New Prescriptions   No medications on file     Arlean Hopping 11/06/16 1020    Forde Dandy, MD 11/06/16 [phone]

## 2016-11-06 NOTE — ED Notes (Signed)
Patient still reports 9/10 pain requesting something else for pain. Spoke with Dr. Juleen China reports she will order one time dose of dilaudid. The morphine is ordered q 3 hours, has not helped.

## 2016-11-06 NOTE — ED Notes (Signed)
Pt requesting pain medicine. RN notified.

## 2016-11-07 LAB — RAPID URINE DRUG SCREEN, HOSP PERFORMED
AMPHETAMINES: NOT DETECTED
Barbiturates: NOT DETECTED
Benzodiazepines: NOT DETECTED
Cocaine: NOT DETECTED
OPIATES: NOT DETECTED
TETRAHYDROCANNABINOL: NOT DETECTED

## 2016-11-07 LAB — COMPREHENSIVE METABOLIC PANEL
ALK PHOS: 48 U/L (ref 38–126)
ALT: 20 U/L (ref 17–63)
ANION GAP: 6 (ref 5–15)
AST: 22 U/L (ref 15–41)
Albumin: 3.3 g/dL — ABNORMAL LOW (ref 3.5–5.0)
BILIRUBIN TOTAL: 1.3 mg/dL — AB (ref 0.3–1.2)
BUN: <5 mg/dL — ABNORMAL LOW (ref 6–20)
CALCIUM: 8.5 mg/dL — AB (ref 8.9–10.3)
CO2: 25 mmol/L (ref 22–32)
Chloride: 106 mmol/L (ref 101–111)
Creatinine, Ser: 1.08 mg/dL (ref 0.61–1.24)
GFR calc non Af Amer: >60 mL/min (ref >60)
GLUCOSE: 123 mg/dL — AB (ref 65–99)
Potassium: 3.6 mmol/L (ref 3.5–5.1)
Sodium: 137 mmol/L (ref 135–145)
TOTAL PROTEIN: 6.1 g/dL — AB (ref 6.5–8.1)

## 2016-11-07 LAB — CBC
HCT: 41.4 % (ref 39.0–52.0)
HEMOGLOBIN: 13.9 g/dL (ref 13.0–17.0)
MCH: 31.2 pg (ref 26.0–34.0)
MCHC: 33.6 g/dL (ref 30.0–36.0)
MCV: 92.8 fL (ref 78.0–100.0)
Platelets: 168 10*3/uL (ref 150–400)
RBC: 4.46 MIL/uL (ref 4.22–5.81)
RDW: 13.7 % (ref 11.5–15.5)
WBC: 9.4 10*3/uL (ref 4.0–10.5)

## 2016-11-07 MED ORDER — HYDROMORPHONE HCL 1 MG/ML IJ SOLN
1.0000 mg | INTRAMUSCULAR | Status: DC | PRN
  Administered 2016-11-07 – 2016-11-08 (×6): 1 mg via INTRAVENOUS
  Filled 2016-11-07 (×6): qty 1

## 2016-11-07 MED ORDER — SODIUM CHLORIDE 0.45 % IV SOLN
INTRAVENOUS | Status: DC
  Administered 2016-11-07 – 2016-11-09 (×4): via INTRAVENOUS

## 2016-11-07 NOTE — Progress Notes (Signed)
Family Medicine Teaching Service Daily Progress Note Intern Pager: [phone]  Patient name: Kyle Berry Medical record number: [id number] Date of birth: Feb 09, 1971 Age: 46 y.o. Gender: male  Primary Care Provider: Medicine, [facility]: GI Code Status: FULL  Pt Overview and Major Events to Date:  Kyle Berry was admitted on 7/24 for acute on chronic pancreatitis, confirmed by CT.  He reported being in significant pain and was given dilaudid x 1 then morphine; however, he said that the morphine was not decreasing his pain, so he was given dilaudid Q3H PRN.  GI was consulted and requested MRI and MRCP.  Assessment and Plan:  Kyle Berry is a 46 y.o. male presenting with abdominal pain. PMH is significant for chronic pancreatitis, congenital single kidney, HTN, hyperlipidemia, migraines and nephrolithiasis.   Acute Pancreatitis: History of chronic pancreatitis. Was admitted in May 2018 with acute on chronic pancreatitis. GI consulted and MRCP revealed acute on chronic pancreatitis with focally dilated portion of the dorsal pancreatic duct in the pancreatic head. Creon supplementation added per GI and patient was advised to have follow up MRI abdomen with MRCP to document stability of pancreatic head findings. Today, patient presents with epigastric abdominal pain, nausea, lipase elevated to 208, and CT abdomen demonstrating edema surrounding the pancreas compatible with acute pancreatitis. Patient has a history of heavy alcohol use but reports last drink was several months ago. CT abdomen did not show any changes with gallbladder and has recent triglyceride value of 99 (5/18). Patient is afebrile, with stable vital signs, and without leukocytosis. Ethanol level negative.  Spoke with GI, who requested MRI. -change infusion to 1/2NS @ 150 cc/hr -NPO for now; advance diet slowly once pain better controlled   -dilaudid 1 mg q3h prn --> change to q4h  prn -zofran q6h prn for nausea/vomiting  -phenergan 12.5 mg IV Q6H PRN for nausea/vomiting  -Pepcid 20 mg IV BID  -re-start Creon as prescribed by GI at recent admission when taking PO  -daily CBC and CMP -MRI complete, will notify GI   Hypertension: Patient with history of HTN but not on any medications. Reports was previously on Amlodipine but this was discontinued due to edema. Has not been on any other anti-hypertensive agents recently. BP elevated at initial presentation (161/99) likely secondary to pain but improved to 130-140/80-90 for most of the day on 7/24.  Current BP 153/97 on 7/25  -continue to monitor  -can add prn BP medication as needed   History of Alcohol Abuse: Patient reports he was recently a heavy drinker but quit several months ago. No history of alcohol withdrawal such as shakiness, hallucinations, or seizures.  -monitor CIWA scoring q4h   FEN/GI: NPO, NS at 200 cc/hr, Pepcid  Prophylaxis: Lovenox    Disposition: Home  Subjective:  Patient's pain is well-controlled with current dilaudid regimen.  He felt nauseated at the smell of food this morning and vomited once, however, he has no complaints this morning.  He has pain in left upper abdomen.  I discussed the plan for him today, including reviewing his MRI and discussing the results with GI, and he expressed understanding.  Objective: Temp:  [98.3 F (36.8 C)-98.4 F (36.9 C)] 98.4 F (36.9 C) (07/25 0615) Pulse Rate:  [56-80] 66 (07/25 0615) Resp:  [17] 17 (07/25 0615) BP: (126-158)/(82-105) 153/97 (07/25 0615) SpO2:  [93 %-99 %] 96 % (07/25 0615) Weight:  [176 lb 6.4 oz (80 kg)] 176 lb 6.4 oz (80 kg) (07/24  1700) Physical Exam: General: Lying in bed, appears comfortable Cardiovascular: RRR, no MRG Respiratory: CTAB Abdomen: +bowel sounds Extremities: no edema, full ROM  Laboratory:  Recent Labs Lab 11/06/16 0730 11/07/16 0414  WBC 9.1 9.4  HGB 14.6 13.9  HCT 42.6 41.4  PLT 184 168     Recent Labs Lab 11/06/16 0730 11/07/16 0414  NA 138 137  K 4.1 3.6  CL 106 106  CO2 22 25  BUN 8 <5*  CREATININE 1.20 1.08  CALCIUM 9.1 8.5*  PROT 6.7 6.1*  BILITOT 1.4* 1.3*  ALKPHOS 62 48  ALT 27 20  AST 43* 22  GLUCOSE 110* 123*     Imaging/Diagnostic Tests:  Mr 3d Recon At Scanner  Result Date: 11/07/2016 CLINICAL DATA:  Acute on chronic pancreatitis. Worsening epigastric pain and nausea. Followup focal pancreatic ductal dilatation in pancreatic head. EXAM: MRI ABDOMEN WITHOUT AND WITH CONTRAST (INCLUDING MRCP) TECHNIQUE: Multiplanar multisequence MR imaging of the abdomen was performed both before and after the administration of intravenous contrast. Heavily T2-weighted images of the biliary and pancreatic ducts were obtained, and three-dimensional MRCP images were rendered by post processing. CONTRAST:  17mL MULTIHANCE GADOBENATE DIMEGLUMINE 529 MG/ML IV SOLN COMPARISON:  08/26/2016 FINDINGS: Lower chest: Mild bibasilar atelectasis. Hepatobiliary: No hepatic masses identified. Gallbladder is unremarkable. No evidence of biliary ductal dilatation or choledocholithiasis. Pancreas: Increased diffuse pancreatic swelling, peripancreatic inflammatory changes, and fluid tracking throughout the bilateral anterior pararenal spaces, consistent with acute pancreatitis. No evidence of pancreatic necrosis or mass. No significant pancreatic ductal dilatation seen on today's study. Suspect pancreas divisum. Spleen:  Within normal limits in size and appearance. Adrenals/Urinary Tract: Normal adrenal glands. Compensatory hypertrophy of the left kidney again seen. Severe right renal parenchymal atrophy and tiny sub-cm cysts unchanged. No evidence of hydronephrosis. Stomach/Bowel: Visualized portions within the abdomen are unremarkable. Vascular/Lymphatic: No pathologically enlarged lymph nodes identified. No abdominal aortic aneurysm or other significant vascular abnormality. Other:  None.  Musculoskeletal:  No suspicious bone lesions identified. IMPRESSION: Interval worsening of acute pancreatitis. No evidence of pancreatic necrosis or pseudocysts. Pancreas divisum suspected. No significant pancreatic ductal dilatation. No evidence of biliary dilatation or choledocholithiasis. Electronically Signed   By: Myles Rosenthal M.D.   On: 11/07/2016 09:31   Mr Abdomen Mrcp Vivien Rossetti Contast  Result Date: 11/07/2016 CLINICAL DATA:  Acute on chronic pancreatitis. Worsening epigastric pain and nausea. Followup focal pancreatic ductal dilatation in pancreatic head. EXAM: MRI ABDOMEN WITHOUT AND WITH CONTRAST (INCLUDING MRCP) TECHNIQUE: Multiplanar multisequence MR imaging of the abdomen was performed both before and after the administration of intravenous contrast. Heavily T2-weighted images of the biliary and pancreatic ducts were obtained, and three-dimensional MRCP images were rendered by post processing. CONTRAST:  17mL MULTIHANCE GADOBENATE DIMEGLUMINE 529 MG/ML IV SOLN COMPARISON:  08/26/2016 FINDINGS: Lower chest: Mild bibasilar atelectasis. Hepatobiliary: No hepatic masses identified. Gallbladder is unremarkable. No evidence of biliary ductal dilatation or choledocholithiasis. Pancreas: Increased diffuse pancreatic swelling, peripancreatic inflammatory changes, and fluid tracking throughout the bilateral anterior pararenal spaces, consistent with acute pancreatitis. No evidence of pancreatic necrosis or mass. No significant pancreatic ductal dilatation seen on today's study. Suspect pancreas divisum. Spleen:  Within normal limits in size and appearance. Adrenals/Urinary Tract: Normal adrenal glands. Compensatory hypertrophy of the left kidney again seen. Severe right renal parenchymal atrophy and tiny sub-cm cysts unchanged. No evidence of hydronephrosis. Stomach/Bowel: Visualized portions within the abdomen are unremarkable. Vascular/Lymphatic: No pathologically enlarged lymph nodes identified. No abdominal  aortic aneurysm or other significant vascular abnormality.  Other:  None. Musculoskeletal:  No suspicious bone lesions identified. IMPRESSION: Interval worsening of acute pancreatitis. No evidence of pancreatic necrosis or pseudocysts. Pancreas divisum suspected. No significant pancreatic ductal dilatation. No evidence of biliary dilatation or choledocholithiasis. Electronically Signed   By: Myles Rosenthal M.D.   On: 11/07/2016 09:31     Renata Gambino, Harlen Labs, MD 11/07/2016, 7:10 AM PGY-1, [facility] Family Medicine FPTS Intern pager: [phone], text pages welcome

## 2016-11-08 LAB — COMPREHENSIVE METABOLIC PANEL
ALT: 13 U/L — AB (ref 17–63)
AST: 15 U/L (ref 15–41)
Albumin: 3 g/dL — ABNORMAL LOW (ref 3.5–5.0)
Alkaline Phosphatase: 36 U/L — ABNORMAL LOW (ref 38–126)
Anion gap: 9 (ref 5–15)
CHLORIDE: 100 mmol/L — AB (ref 101–111)
CO2: 24 mmol/L (ref 22–32)
CREATININE: 1.25 mg/dL — AB (ref 0.61–1.24)
Calcium: 8 mg/dL — ABNORMAL LOW (ref 8.9–10.3)
Glucose, Bld: 85 mg/dL (ref 65–99)
POTASSIUM: 3.2 mmol/L — AB (ref 3.5–5.1)
SODIUM: 133 mmol/L — AB (ref 135–145)
Total Bilirubin: 1.6 mg/dL — ABNORMAL HIGH (ref 0.3–1.2)
Total Protein: 5.9 g/dL — ABNORMAL LOW (ref 6.5–8.1)

## 2016-11-08 LAB — CBC
HCT: 39.1 % (ref 39.0–52.0)
Hemoglobin: 13.2 g/dL (ref 13.0–17.0)
MCH: 31.5 pg (ref 26.0–34.0)
MCHC: 33.8 g/dL (ref 30.0–36.0)
MCV: 93.3 fL (ref 78.0–100.0)
PLATELETS: 158 10*3/uL (ref 150–400)
RBC: 4.19 MIL/uL — AB (ref 4.22–5.81)
RDW: 13.9 % (ref 11.5–15.5)
WBC: 11.6 10*3/uL — AB (ref 4.0–10.5)

## 2016-11-08 MED ORDER — ONDANSETRON HCL 4 MG PO TABS: 4.0000 mg | ORAL_TABLET | Freq: Three times a day (TID) | ORAL | Status: DC | PRN

## 2016-11-08 MED ORDER — ACETAMINOPHEN 650 MG RE SUPP
650.0000 mg | Freq: Four times a day (QID) | RECTAL | Status: DC | PRN
  Filled 2016-11-08: qty 1

## 2016-11-08 MED ORDER — HYDROMORPHONE HCL 2 MG PO TABS
2.0000 mg | ORAL_TABLET | ORAL | Status: DC | PRN
  Administered 2016-11-08 – 2016-11-09 (×4): 2 mg via ORAL
  Filled 2016-11-08 (×4): qty 1

## 2016-11-08 MED ORDER — FAMOTIDINE 20 MG PO TABS
40.0000 mg | ORAL_TABLET | Freq: Two times a day (BID) | ORAL | Status: DC
  Administered 2016-11-08 – 2016-11-09 (×2): 40 mg via ORAL
  Filled 2016-11-08 (×2): qty 2

## 2016-11-08 MED ORDER — ACETAMINOPHEN 325 MG PO TABS
650.0000 mg | ORAL_TABLET | Freq: Four times a day (QID) | ORAL | Status: DC | PRN
  Administered 2016-11-08: 650 mg via ORAL
  Filled 2016-11-08: qty 2

## 2016-11-08 MED ORDER — POTASSIUM CHLORIDE CRYS ER 20 MEQ PO TBCR
40.0000 meq | EXTENDED_RELEASE_TABLET | Freq: Once | ORAL | Status: AC
  Administered 2016-11-08: 40 meq via ORAL
  Filled 2016-11-08: qty 2

## 2016-11-08 MED ORDER — PROMETHAZINE HCL 25 MG PO TABS: 12.5000 mg | ORAL_TABLET | Freq: Four times a day (QID) | ORAL | Status: DC | PRN

## 2016-11-08 NOTE — Progress Notes (Signed)
Family Medicine Teaching Service Daily Progress Note Intern Pager: [phone]  Patient name: Kyle Berry Medical record number: [id number] Date of birth: 1970-09-19 Age: 46 y.o. Gender: male  Primary Care Provider: Medicine, [facility]: GI Code Status: FULL  Pt Overview and Major Events to Date:  Kyle Berry was admitted on 7/24 for acute on chronic pancreatitis, confirmed by CT.  He reported being in significant pain and was given dilaudid x 1 then morphine; however, he said that the morphine was not decreasing his pain, so he was given dilaudid Q3H PRN.  GI was consulted and requested MRI and MRCP, which showed interval worsening of pancreatitis.  Assessment and Plan:  Kyle Berry is a 46 y.o. male presenting with abdominal pain. PMH is significant for chronic pancreatitis, congenital single kidney, HTN, hyperlipidemia, migraines and nephrolithiasis.   Acute Pancreatitis: History of chronic pancreatitis. Was admitted in May 2018 with acute on chronic pancreatitis. GI consulted and MRCP revealed acute on chronic pancreatitis with focally dilated portion of the dorsal pancreatic duct in the pancreatic head. Creon supplementation added per GI and patient was advised to have follow up MRI abdomen with MRCP to document stability of pancreatic head findings. Today, patient presents with epigastric abdominal pain, nausea, lipase elevated to 208, and CT abdomen demonstrating edema surrounding the pancreas compatible with acute pancreatitis. Patient has a history of heavy alcohol use but reports last drink was several months ago. CT abdomen did not show any changes with gallbladder and has recent triglyceride value of 99 (5/18). Patient is afebrile, with stable vital signs, and without leukocytosis. Ethanol level negative.  Spoke with GI, who requested MRI.  MRI showed interval worsening of pancreatitis, and GI was notified. -advance to full liquid  diet -decrease infusion to 1/2NS @ 75 cc/hr -dilaudid 2 mg PO Q4H -zofran PO q6h prn for nausea/vomiting  -phenergan 12.5 mg PO Q6H PRN for nausea/vomiting  -Pepcid 40 mg PO BID  -re-start Creon as prescribed by GI at recent admission when advanced from full liquid diet -daily CBC and CMP    Hypertension: Patient with history of HTN but not on any medications. Reports was previously on Amlodipine but this was discontinued due to edema. Has not been on any other anti-hypertensive agents recently. BP elevated at initial presentation (161/99) likely secondary to pain but improved to 130-140/80-90 for most of the day on 7/24.  Current BP 153/97 on 7/25  -continue to monitor  -can add prn BP medication as needed   History of Alcohol Abuse: Patient reports he was recently a heavy drinker but quit several months ago. No history of alcohol withdrawal such as shakiness, hallucinations, or seizures.  -monitor CIWA scoring q4h   FEN/GI: full liquid diet, 1/2NS at 75 cc/hr, Pepcid  Prophylaxis: Lovenox    Disposition: Home  Subjective:  Patient's pain is well-controlled with current dilaudid regimen, and he says that his pain has improved over the past 24 hours.  He has not experienced nausea or vomited over the last day and thinks he can tolerate a full liquid diet.  He continues to have pain in left upper abdomen.   Objective: Temp:  [99 F (37.2 C)-101.8 F (38.8 C)] 100.8 F (38.2 C) (07/26 0426) Pulse Rate:  [70-93] 86 (07/26 0426) Resp:  [16-18] 18 (07/26 0426) BP: (133-150)/(76-94) 133/76 (07/26 0426) SpO2:  [94 %-96 %] 96 % (07/26 0426) Physical Exam: General: Standing beside bed, appears comfortable Cardiovascular: RRR, no MRG Respiratory: CTAB Abdomen: +  bowel sounds Extremities: no edema, full ROM  Laboratory:  Recent Labs Lab 11/06/16 0730 11/07/16 0414 11/08/16 0312  WBC 9.1 9.4 11.6*  HGB 14.6 13.9 13.2  HCT 42.6 41.4 39.1  PLT 184 168 158    Recent  Labs Lab 11/06/16 0730 11/07/16 0414 11/08/16 0312  NA 138 137 133*  K 4.1 3.6 3.2*  CL 106 106 100*  CO2 22 25 24   BUN 8 <5* <5*  CREATININE 1.20 1.08 1.25*  CALCIUM 9.1 8.5* 8.0*  PROT 6.7 6.1* 5.9*  BILITOT 1.4* 1.3* 1.6*  ALKPHOS 62 48 36*  ALT 27 20 13*  AST 43* 22 15  GLUCOSE 110* 123* 85     Imaging/Diagnostic Tests:  No results found.   Kathrene Alu, MD 11/08/2016, 7:31 AM PGY-1, [place] Intern pager: [phone], text pages welcome

## 2016-11-09 LAB — CBC
HCT: 37 % — ABNORMAL LOW (ref 39.0–52.0)
Hemoglobin: 12.4 g/dL — ABNORMAL LOW (ref 13.0–17.0)
MCH: 31.4 pg (ref 26.0–34.0)
MCHC: 33.5 g/dL (ref 30.0–36.0)
MCV: 93.7 fL (ref 78.0–100.0)
PLATELETS: 164 10*3/uL (ref 150–400)
RBC: 3.95 MIL/uL — ABNORMAL LOW (ref 4.22–5.81)
RDW: 13.8 % (ref 11.5–15.5)
WBC: 9.7 10*3/uL (ref 4.0–10.5)

## 2016-11-09 LAB — COMPREHENSIVE METABOLIC PANEL
ALT: 13 U/L — ABNORMAL LOW (ref 17–63)
AST: 15 U/L (ref 15–41)
Albumin: 2.7 g/dL — ABNORMAL LOW (ref 3.5–5.0)
Alkaline Phosphatase: 51 U/L (ref 38–126)
Anion gap: 6 (ref 5–15)
BILIRUBIN TOTAL: 1.3 mg/dL — AB (ref 0.3–1.2)
BUN: 5 mg/dL — ABNORMAL LOW (ref 6–20)
CALCIUM: 8.4 mg/dL — AB (ref 8.9–10.3)
CHLORIDE: 102 mmol/L (ref 101–111)
CO2: 26 mmol/L (ref 22–32)
CREATININE: 1.17 mg/dL (ref 0.61–1.24)
Glucose, Bld: 100 mg/dL — ABNORMAL HIGH (ref 65–99)
POTASSIUM: 3.6 mmol/L (ref 3.5–5.1)
Sodium: 134 mmol/L — ABNORMAL LOW (ref 135–145)
TOTAL PROTEIN: 6.2 g/dL — AB (ref 6.5–8.1)

## 2016-11-09 MED ORDER — HYDROMORPHONE HCL 2 MG PO TABS
1.0000 mg | ORAL_TABLET | ORAL | Status: DC | PRN
  Administered 2016-11-09: 1 mg via ORAL
  Filled 2016-11-09: qty 1

## 2016-11-09 MED ORDER — PROMETHAZINE HCL 12.5 MG PO TABS: 12.5000 mg | ORAL_TABLET | Freq: Four times a day (QID) | ORAL | 0 refills | Status: DC | PRN

## 2016-11-09 MED ORDER — OXYCODONE-ACETAMINOPHEN 5-325 MG PO TABS
1.0000 | ORAL_TABLET | Freq: Four times a day (QID) | ORAL | 0 refills | Status: DC | PRN
Start: 2016-11-09 — End: 2017-01-20

## 2016-11-09 NOTE — Progress Notes (Signed)
Nsg Discharge Note  Admit Date:  11/06/2016 Discharge date: 11/09/2016   [name] to be D/C'd Home per MD order.  AVS completed.  Copy for chart, and copy for patient signed, and dated. Patient/caregiver able to verbalize understanding.  Discharge Medication: Allergies as of 11/09/2016      Reactions   Diclofenac Hives   Norvasc [amlodipine Besylate] Other (See Comments)   Unknown   Omeprazole Other (See Comments)   Unknown   Prednisone Other (See Comments)   Mood swings      Medication List    STOP taking these medications   ondansetron 4 MG disintegrating tablet Commonly known as:  ZOFRAN ODT   oxyCODONE 5 MG immediate release tablet Commonly known as:  Oxy IR/ROXICODONE     TAKE these medications   ibuprofen 200 MG tablet Commonly known as:  ADVIL,MOTRIN Take 400 mg by mouth every 6 (six) hours as needed for headache or moderate pain.   lipase/protease/amylase 12000 units Cpep capsule Commonly known as:  CREON Take 1 capsule (12,000 Units total) by mouth 3 (three) times daily with meals.   multivitamin with minerals tablet Take 1 tablet by mouth daily.   omeprazole 20 MG capsule Commonly known as:  PRILOSEC Take 20 mg by mouth daily as needed (for heart burn).   oxyCODONE-acetaminophen 5-325 MG tablet Commonly known as:  ROXICET Take 1 tablet by mouth every 6 (six) hours as needed. What changed:  reasons to take this   promethazine 12.5 MG tablet Commonly known as:  PHENERGAN Take 1 tablet (12.5 mg total) by mouth every 6 (six) hours as needed for nausea, vomiting or refractory nausea / vomiting.   senna-docusate 8.6-50 MG tablet Commonly known as:  Senokot-S Take 1 tablet by mouth daily.   traMADol 50 MG tablet Commonly known as:  ULTRAM Take 50 mg by mouth every 6 (six) hours as needed for moderate pain.       Discharge Assessment: Vitals:   11/09/16 0424 11/09/16 1428  BP: (!) 147/87 129/81  Pulse: 86 72  Resp: 18 18  Temp: 99.5 F  (37.5 C) 99.1 F (37.3 C)   Skin clean, dry and intact without evidence of skin break down, no evidence of skin tears noted. IV catheter discontinued intact. Site without signs and symptoms of complications - no redness or edema noted at insertion site, patient denies c/o pain - only slight tenderness at site.  Dressing with slight pressure applied.  D/c Instructions-Education: Discharge instructions given to patient/family with verbalized understanding. D/c education completed with patient/family including follow up instructions, medication list, d/c activities limitations if indicated, with other d/c instructions as indicated by MD - patient able to verbalize understanding, all questions fully answered. Patient instructed to return to ED, call 911, or call MD for any changes in condition.  Patient escorted via [place], and D/C home via private auto.  Maybelline Kolarik Margaretha Sheffield, RN 11/09/2016 3:15 PM

## 2016-11-09 NOTE — Progress Notes (Signed)
Family Medicine Teaching Service Daily Progress Note Intern Pager: [phone]  Patient name: Kyle Berry Medical record number: [id number] Date of birth: February 20, 1971 Age: 46 y.o. Gender: male  Primary Care Provider: Medicine, [facility] Consultants: GI Code Status: FULL  Pt Overview and Major Events to Date:  Kyle Berry was admitted on 7/24 for acute on chronic pancreatitis, confirmed by CT.  He reported being in significant pain and was given dilaudid x 1 then morphine; however, he said that the morphine was not decreasing his pain, so he was given dilaudid Q3H PRN.  GI was consulted and requested MRI and MRCP, which showed interval worsening of pancreatitis.  Assessment and Plan:  Kyle Berry is a 46 y.o. male presenting with abdominal pain. PMH is significant for chronic pancreatitis, congenital single kidney, HTN, hyperlipidemia, migraines and nephrolithiasis.   Acute Pancreatitis: History of chronic pancreatitis. Was admitted in May 2018 with acute on chronic pancreatitis. GI consulted and MRCP revealed acute on chronic pancreatitis with focally dilated portion of the dorsal pancreatic duct in the pancreatic head. Creon supplementation added per GI and patient was advised to have follow up MRI abdomen with MRCP to document stability of pancreatic head findings. Today, patient presents with epigastric abdominal pain, nausea, lipase elevated to 208, and CT abdomen demonstrating edema surrounding the pancreas compatible with acute pancreatitis. Patient has a history of heavy alcohol use but reports last drink was several months ago. CT abdomen did not show any changes with gallbladder and has recent triglyceride value of 99 (5/18). Patient is afebrile, with stable vital signs, and without leukocytosis. Ethanol level negative.  Spoke with GI, who requested MRI.  MRI showed interval worsening of pancreatitis, and GI was notified. -continue full liquid  diet -decrease infusion to 1/2NS @ 75 cc/hr -decrease dilaudid to 1 mg PO Q4H -zofran PO q6h prn for nausea/vomiting  -phenergan 12.5 mg PO Q6H PRN for nausea/vomiting  -Pepcid 40 mg PO BID  -re-start Creon as prescribed by GI at recent admission when advanced from full liquid diet -daily CBC and CMP   Hypertension: Patient with history of HTN but not on any medications. Reports was previously on Amlodipine but this was discontinued due to edema. Has not been on any other anti-hypertensive agents recently. BP elevated at initial presentation (161/99) likely secondary to pain but improved to 130-140/80-90 for most of the day on 7/24.  Current BP 147/87 on 7/27  -continue to monitor  -can add prn BP medication as needed    History of Alcohol Abuse: Patient reports he was recently a heavy drinker but quit several months ago. No history of alcohol withdrawal such as shakiness, hallucinations, or seizures.  CIWA score = 0 on 7/27 -monitor CIWA scoring q4h    FEN/GI: full liquid diet, 1/2NS at 75 cc/hr, Pepcid  Prophylaxis: Lovenox    Disposition: Home  Subjective:  Patient's pain has worsened since 0400 this am.  He received 2 mg dilaudid at that time as well as 1 mg dilaudid at 0800.  He has not experienced nausea, but he does not feel like eating because of his pain.  He says that he doesn't feel ready to advance his diet.   Objective: Temp:  [99.5 F (37.5 C)-101.6 F (38.7 C)] 99.5 F (37.5 C) (07/27 0424) Pulse Rate:  [86-111] 86 (07/27 0424) Resp:  [18-20] 18 (07/27 0424) BP: (128-147)/(77-92) 147/87 (07/27 0424) SpO2:  [91 %-95 %] 95 % (07/27 0424) Physical Exam: General: lying in  med, appears mildly uncomfortable Cardiovascular: RRR, no MRG Respiratory: CTAB Abdomen: +bowel sounds Extremities: no edema, full ROM  Laboratory:  Recent Labs Lab 11/07/16 0414 11/08/16 0312 11/09/16 0443  WBC 9.4 11.6* 9.7  HGB 13.9 13.2 12.4*  HCT 41.4 39.1 37.0*  PLT 168 158  164    Recent Labs Lab 11/07/16 0414 11/08/16 0312 11/09/16 0443  NA 137 133* 134*  K 3.6 3.2* 3.6  CL 106 100* 102  CO2 25 24 26   BUN <5* <5* 5*  CREATININE 1.08 1.25* 1.17  CALCIUM 8.5* 8.0* 8.4*  PROT 6.1* 5.9* 6.2*  BILITOT 1.3* 1.6* 1.3*  ALKPHOS 48 36* 51  ALT 20 13* 13*  AST 22 15 15   GLUCOSE 123* 85 100*     Imaging/Diagnostic Tests:  No results found.   Kyle Alu, MD 11/09/2016, 7:17 AM PGY-1, [place] Intern pager: [phone], text pages welcome

## 2016-11-09 NOTE — Progress Notes (Signed)
Discharge instructions (including medications) discussed with and copy provided to patient/caregiver 

## 2016-11-09 NOTE — Discharge Instructions (Signed)
For nausea, you can take one phenergan tablet up to every 6 hours as needed. For pain, you can take one oxycodone tablet up to every 6 hours as needed.   Please be sure schedule an appointment with your GI doctor ASAP. Also please schedule an appointment with your regular doctor within the next week.   Take all of your other medications as you were before hospital admission.

## 2016-11-10 NOTE — Discharge Summary (Signed)
[facility] Discharge Summary  Patient name: Kyle Berry Medical record number: [id number] Date of birth: [birth year] Age: 46 y.o. Gender: male Date of Admission: 11/06/2016  Date of Discharge: 11/09/16 Admitting Physician: Zenia Resides, MD  Primary Care Provider: Medicine, [facility] Consultants: none  Indication for Hospitalization: Acute on chronic pancreatitis  Discharge Diagnoses/Problem List:  Acute on chronic pancreatitis - likely due to history of alcoholism.  Patient reports that he quit drinking one month ago.  MRI negative for pseudocyst or other complications.  [facility] has been following him.  Hypertension - on no medications for this currently.  BP was slightly elevated during hospital stay, but this could be due to pain.   Disposition: Home  Discharge Condition: stable, improving  Discharge Exam: please see progress note from day of discharge  Brief Hospital Course:  Kyle Berry was admitted on 11/06/16 for acute on chronic pancreatitis, confirmed by CT.  He had been admitted for a similar episode in May 2018.  [facility] was consulted since they had requested an MRI 3 months after his admission in May, and they wanted to go ahead and get an MRI while patient was hospitalized.  MRI showed no complications from pancreatitis.  Pain and nausea were controlled with dilaudid and phenergan respectively, and the patient improved daily.  These medications were weaned down, and he tolerated a full liquid diet on 7/26.  He was discharged on 7/27.  Issues for Follow Up:  1. Chronic pancreatitis - patient should follow up with GI if PCP deems this necessary and continue his Creon supplements 2. Alcohol abuse - patient reports that he quit drinking one month ago, but he should be followed closely concerning his alcoholism and encouraged to continue abstinence 3. Hypertension - on no blood pressure medications at home.  Should be  followed outpatient for need for medications in the future.  Significant Procedures: none  Significant Labs and Imaging:   Recent Labs Lab 11/07/16 0414 11/08/16 0312 11/09/16 0443  WBC 9.4 11.6* 9.7  HGB 13.9 13.2 12.4*  HCT 41.4 39.1 37.0*  PLT 168 158 164    Recent Labs Lab 11/06/16 0730 11/07/16 0414 11/08/16 0312 11/09/16 0443  NA 138 137 133* 134*  K 4.1 3.6 3.2* 3.6  CL 106 106 100* 102  CO2 22 25 24 26   GLUCOSE 110* 123* 85 100*  BUN 8 <5* <5* 5*  CREATININE 1.20 1.08 1.25* 1.17  CALCIUM 9.1 8.5* 8.0* 8.4*  ALKPHOS 62 48 36* 51  AST 43* 22 15 15   ALT 27 20 13* 13*  ALBUMIN 3.9 3.3* 3.0* 2.7*    Ct Abdomen Pelvis W Contrast  Result Date: 11/06/2016 CLINICAL DATA:  Epigastric abdominal pain.  History of pancreatitis. EXAM: CT ABDOMEN AND PELVIS WITH CONTRAST TECHNIQUE: Multidetector CT imaging of the abdomen and pelvis was performed using the standard protocol following bolus administration of intravenous contrast. CONTRAST:  ISOVUE-300 IOPAMIDOL (ISOVUE-300) INJECTION 61% COMPARISON:  08/25/2016 FINDINGS: Lower chest: Left base atelectasis or scarring. Heart is normal size. No effusions. Hepatobiliary: No focal hepatic abnormality. Gallbladder unremarkable. Pancreas: Edema/stranding noted around the pancreas compatible with acute pancreatitis. Small calcification in the pancreatic head and prominence of the pancreatic duct, likely related to chronic pancreatitis. No focal pancreatic abnormality or evidence of necrosis. Spleen: No focal abnormality.  Normal size. Adrenals/Urinary Tract: Right kidney is severely atrophic, stable. Left kidney unremarkable. Adrenal glands and urinary bladder unremarkable. Stomach/Bowel: Appendix is normal. Stomach,  large and small bowel grossly unremarkable. Vascular/Lymphatic: No evidence of aneurysm or adenopathy. Reproductive: No visible focal abnormality. Other: No free fluid or free air. Musculoskeletal: No acute bony abnormality.  IMPRESSION: Edema surrounding the pancreas compatible with acute pancreatitis. Probable changes of chronic pancreatitis in the pancreatic head. Severely atrophic right kidney. Electronically Signed   By: Rolm Baptise M.D.   On: 11/06/2016 10:04   Mr 3d Recon At Scanner  Result Date: 11/07/2016 CLINICAL DATA:  Acute on chronic pancreatitis. Worsening epigastric pain and nausea. Followup focal pancreatic ductal dilatation in pancreatic head. EXAM: MRI ABDOMEN WITHOUT AND WITH CONTRAST (INCLUDING MRCP) TECHNIQUE: Multiplanar multisequence MR imaging of the abdomen was performed both before and after the administration of intravenous contrast. Heavily T2-weighted images of the biliary and pancreatic ducts were obtained, and three-dimensional MRCP images were rendered by post processing. CONTRAST:  40mL MULTIHANCE GADOBENATE DIMEGLUMINE 529 MG/ML IV SOLN COMPARISON:  08/26/2016 FINDINGS: Lower chest: Mild bibasilar atelectasis. Hepatobiliary: No hepatic masses identified. Gallbladder is unremarkable. No evidence of biliary ductal dilatation or choledocholithiasis. Pancreas: Increased diffuse pancreatic swelling, peripancreatic inflammatory changes, and fluid tracking throughout the bilateral anterior pararenal spaces, consistent with acute pancreatitis. No evidence of pancreatic necrosis or mass. No significant pancreatic ductal dilatation seen on today's study. Suspect pancreas divisum. Spleen:  Within normal limits in size and appearance. Adrenals/Urinary Tract: Normal adrenal glands. Compensatory hypertrophy of the left kidney again seen. Severe right renal parenchymal atrophy and tiny sub-cm cysts unchanged. No evidence of hydronephrosis. Stomach/Bowel: Visualized portions within the abdomen are unremarkable. Vascular/Lymphatic: No pathologically enlarged lymph nodes identified. No abdominal aortic aneurysm or other significant vascular abnormality. Other:  None. Musculoskeletal:  No suspicious bone lesions  identified. IMPRESSION: Interval worsening of acute pancreatitis. No evidence of pancreatic necrosis or pseudocysts. Pancreas divisum suspected. No significant pancreatic ductal dilatation. No evidence of biliary dilatation or choledocholithiasis. Electronically Signed   By: Earle Gell M.D.   On: 11/07/2016 09:31   Mr Abdomen Mrcp Moise Boring Contast  Result Date: 11/07/2016 CLINICAL DATA:  Acute on chronic pancreatitis. Worsening epigastric pain and nausea. Followup focal pancreatic ductal dilatation in pancreatic head. EXAM: MRI ABDOMEN WITHOUT AND WITH CONTRAST (INCLUDING MRCP) TECHNIQUE: Multiplanar multisequence MR imaging of the abdomen was performed both before and after the administration of intravenous contrast. Heavily T2-weighted images of the biliary and pancreatic ducts were obtained, and three-dimensional MRCP images were rendered by post processing. CONTRAST:  2mL MULTIHANCE GADOBENATE DIMEGLUMINE 529 MG/ML IV SOLN COMPARISON:  08/26/2016 FINDINGS: Lower chest: Mild bibasilar atelectasis. Hepatobiliary: No hepatic masses identified. Gallbladder is unremarkable. No evidence of biliary ductal dilatation or choledocholithiasis. Pancreas: Increased diffuse pancreatic swelling, peripancreatic inflammatory changes, and fluid tracking throughout the bilateral anterior pararenal spaces, consistent with acute pancreatitis. No evidence of pancreatic necrosis or mass. No significant pancreatic ductal dilatation seen on today's study. Suspect pancreas divisum. Spleen:  Within normal limits in size and appearance. Adrenals/Urinary Tract: Normal adrenal glands. Compensatory hypertrophy of the left kidney again seen. Severe right renal parenchymal atrophy and tiny sub-cm cysts unchanged. No evidence of hydronephrosis. Stomach/Bowel: Visualized portions within the abdomen are unremarkable. Vascular/Lymphatic: No pathologically enlarged lymph nodes identified. No abdominal aortic aneurysm or other significant vascular  abnormality. Other:  None. Musculoskeletal:  No suspicious bone lesions identified. IMPRESSION: Interval worsening of acute pancreatitis. No evidence of pancreatic necrosis or pseudocysts. Pancreas divisum suspected. No significant pancreatic ductal dilatation. No evidence of biliary dilatation or choledocholithiasis. Electronically Signed   By: Earle Gell M.D.   On:  11/07/2016 09:31    Results/Tests Pending at Time of Discharge: none  Discharge Medications:  Allergies as of 11/09/2016      Reactions   Diclofenac Hives   Norvasc [amlodipine Besylate] Other (See Comments)   Unknown   Omeprazole Other (See Comments)   Unknown   Prednisone Other (See Comments)   Mood swings      Medication List    STOP taking these medications   ondansetron 4 MG disintegrating tablet Commonly known as:  ZOFRAN ODT   oxyCODONE 5 MG immediate release tablet Commonly known as:  Oxy IR/ROXICODONE     TAKE these medications   ibuprofen 200 MG tablet Commonly known as:  ADVIL,MOTRIN Take 400 mg by mouth every 6 (six) hours as needed for headache or moderate pain.   lipase/protease/amylase 12000 units Cpep capsule Commonly known as:  CREON Take 1 capsule (12,000 Units total) by mouth 3 (three) times daily with meals.   multivitamin with minerals tablet Take 1 tablet by mouth daily.   omeprazole 20 MG capsule Commonly known as:  PRILOSEC Take 20 mg by mouth daily as needed (for heart burn).   oxyCODONE-acetaminophen 5-325 MG tablet Commonly known as:  ROXICET Take 1 tablet by mouth every 6 (six) hours as needed. What changed:  reasons to take this   promethazine 12.5 MG tablet Commonly known as:  PHENERGAN Take 1 tablet (12.5 mg total) by mouth every 6 (six) hours as needed for nausea, vomiting or refractory nausea / vomiting.   senna-docusate 8.6-50 MG tablet Commonly known as:  Senokot-S Take 1 tablet by mouth daily.   traMADol 50 MG tablet Commonly known as:  ULTRAM Take 50 mg by  mouth every 6 (six) hours as needed for moderate pain.       Discharge Instructions: Please refer to Patient Instructions section of EMR for full details.  Patient was counseled important signs and symptoms that should prompt return to medical care, changes in medications, dietary instructions, activity restrictions, and follow up appointments.   Follow-Up Appointments: Follow-up Information    Medicine, [facility]. Schedule an appointment as soon as possible for a visit.   Why:  Within one week Contact information: [place] [address] [phone]           Kathrene Alu, MD 11/10/2016, 5:16 PM PGY-1, [place]

## 2016-12-20 DIAGNOSIS — K860 Alcohol-induced chronic pancreatitis: Secondary | ICD-10-CM | POA: Insufficient documentation

## 2016-12-20 DIAGNOSIS — Z79899 Other long term (current) drug therapy: Secondary | ICD-10-CM | POA: Insufficient documentation

## 2016-12-20 DIAGNOSIS — I10 Essential (primary) hypertension: Secondary | ICD-10-CM | POA: Insufficient documentation

## 2016-12-20 DIAGNOSIS — Z791 Long term (current) use of non-steroidal anti-inflammatories (NSAID): Secondary | ICD-10-CM | POA: Insufficient documentation

## 2016-12-21 ENCOUNTER — Encounter (HOSPITAL_COMMUNITY): Payer: Self-pay | Admitting: Emergency Medicine

## 2016-12-21 ENCOUNTER — Emergency Department (HOSPITAL_COMMUNITY)
Admission: EM | Admit: 2016-12-21 | Discharge: 2016-12-21 | Disposition: A | Discharge status: Home / Self Care | Payer: Self-pay | Attending: Emergency Medicine | Admitting: Emergency Medicine

## 2016-12-21 DIAGNOSIS — K860 Alcohol-induced chronic pancreatitis: Secondary | ICD-10-CM

## 2016-12-21 LAB — COMPREHENSIVE METABOLIC PANEL
ALBUMIN: 3.9 g/dL (ref 3.5–5.0)
ALT: 26 U/L (ref 17–63)
AST: 37 U/L (ref 15–41)
Alkaline Phosphatase: 55 U/L (ref 38–126)
Anion gap: 10 (ref 5–15)
BILIRUBIN TOTAL: 1.6 mg/dL — AB (ref 0.3–1.2)
BUN: 6 mg/dL (ref 6–20)
CHLORIDE: 102 mmol/L (ref 101–111)
CO2: 25 mmol/L (ref 22–32)
Calcium: 9 mg/dL (ref 8.9–10.3)
Creatinine, Ser: 1.14 mg/dL (ref 0.61–1.24)
GFR calc Af Amer: >60 mL/min (ref >60)
GFR calc non Af Amer: >60 mL/min (ref >60)
GLUCOSE: 107 mg/dL — AB (ref 65–99)
POTASSIUM: 4.5 mmol/L (ref 3.5–5.1)
Sodium: 137 mmol/L (ref 135–145)
Total Protein: 7 g/dL (ref 6.5–8.1)

## 2016-12-21 LAB — CBC
HEMATOCRIT: 40.7 % (ref 39.0–52.0)
Hemoglobin: 13.7 g/dL (ref 13.0–17.0)
MCH: 31.9 pg (ref 26.0–34.0)
MCHC: 33.7 g/dL (ref 30.0–36.0)
MCV: 94.9 fL (ref 78.0–100.0)
Platelets: 189 10*3/uL (ref 150–400)
RBC: 4.29 MIL/uL (ref 4.22–5.81)
RDW: 13.7 % (ref 11.5–15.5)
WBC: 7 10*3/uL (ref 4.0–10.5)

## 2016-12-21 LAB — URINALYSIS, ROUTINE W REFLEX MICROSCOPIC
BILIRUBIN URINE: NEGATIVE
GLUCOSE, UA: NEGATIVE mg/dL
Hgb urine dipstick: NEGATIVE
KETONES UR: NEGATIVE mg/dL
Leukocytes, UA: NEGATIVE
Nitrite: NEGATIVE
PH: 6 (ref 5.0–8.0)
Protein, ur: NEGATIVE mg/dL
Specific Gravity, Urine: 1.013 (ref 1.005–1.030)

## 2016-12-21 LAB — LIPASE, BLOOD: LIPASE: 76 U/L — AB (ref 11–51)

## 2016-12-21 MED ORDER — HYDROMORPHONE HCL 1 MG/ML IJ SOLN
0.5000 mg | Freq: Once | INTRAMUSCULAR | Status: AC
  Administered 2016-12-21: 0.5 mg via INTRAMUSCULAR
  Filled 2016-12-21: qty 1

## 2016-12-21 MED ORDER — PROMETHAZINE HCL 25 MG/ML IJ SOLN
12.5000 mg | Freq: Once | INTRAMUSCULAR | Status: AC
  Administered 2016-12-21: 12.5 mg via INTRAMUSCULAR
  Filled 2016-12-21: qty 1

## 2016-12-21 MED ORDER — PROMETHAZINE HCL 25 MG RE SUPP: 25.0000 mg | Freq: Four times a day (QID) | RECTAL | 0 refills | Status: DC | PRN

## 2016-12-21 NOTE — ED Triage Notes (Signed)
Pt reports "my pancreatitis is back."  Pt c/o URQ pain, emesis, denies loose stool.

## 2016-12-21 NOTE — Discharge Instructions (Signed)
Take the suppository if you cant take po.

## 2016-12-21 NOTE — ED Provider Notes (Signed)
[facility] DEPT Provider Note   CSN: [id number] Arrival date & time: 12/20/16  2339     History   Chief Complaint Chief Complaint  Patient presents with  . Abdominal Pain    HPI Kyle Berry is a 46 y.o. male.  46 yo M with a cc of RUQ pain that radiates to his epigastrium.  Having n/v.  Going on for the past two days.  Tried home meds without improvement. Hx of pancreatitis, thinks it feels the same.  Having trouble eating and drinking.  Denies fevers, chills.  Denies recurrent etoh use.     The history is provided by the patient.  Abdominal Pain   This is a chronic problem. The current episode started 2 days ago. The problem occurs constantly. The problem has been gradually worsening. The pain is associated with eating. The pain is located in the epigastric region. The quality of the pain is sharp and shooting. The pain is at a severity of 9/10. The pain is severe. Associated symptoms include nausea and vomiting. Pertinent negatives include fever, diarrhea, headaches, arthralgias and myalgias. Nothing aggravates the symptoms. Nothing relieves the symptoms.    Past Medical History:  Diagnosis Date  . Acute pancreatitis 11/23/2015  . Chronic bronchitis ([place])   . Chronic lower back pain   . Congenital single kidney   . GERD (gastroesophageal reflux disease)   . High cholesterol   . Hypertension   . Kidney calculi   . Migraines    "once/month" (11/23/2015)  . Nephrolithiasis   . Pneumonia ~ 09/2015  . Renal disorder     Patient Active Problem List   Diagnosis Date Noted  . Recurrent acute pancreatitis   . History of alcohol abuse   . Hypokalemia   . Non-intractable vomiting with nausea   . Alcohol abuse   . Essential hypertension   . Esophageal reflux   . Pancreatitis 11/23/2015    Past Surgical History:  Procedure Laterality Date  . NO PAST SURGERIES         Home Medications    Prior to Admission medications   Medication Sig Start Date End Date  Taking? Authorizing Provider  ibuprofen (ADVIL,MOTRIN) 200 MG tablet Take 400 mg by mouth every 6 (six) hours as needed for headache or moderate pain.    [provider]  lipase/protease/amylase (CREON) 12000 units CPEP capsule Take 1 capsule (12,000 Units total) by mouth 3 (three) times daily with meals. Patient not taking: Reported on 11/06/2016 08/30/16   Lovenia Kim, MD  Multiple Vitamins-Minerals (MULTIVITAMIN WITH MINERALS) tablet Take 1 tablet by mouth daily.    [provider]  omeprazole (PRILOSEC) 20 MG capsule Take 20 mg by mouth daily as needed (for heart burn).     [provider]  oxyCODONE-acetaminophen (ROXICET) 5-325 MG tablet Take 1 tablet by mouth every 6 (six) hours as needed. 11/09/16 11/09/17  Verner Mould, MD  promethazine (PHENERGAN) 12.5 MG tablet Take 1 tablet (12.5 mg total) by mouth every 6 (six) hours as needed for nausea, vomiting or refractory nausea / vomiting. 11/09/16   Verner Mould, MD  promethazine (PHENERGAN) 25 MG suppository Place 1 suppository (25 mg total) rectally every 6 (six) hours as needed for nausea or vomiting. 12/21/16   Deno Etienne, DO  senna-docusate (SENOKOT-S) 8.6-50 MG tablet Take 1 tablet by mouth daily. 08/31/16   Lovenia Kim, MD  traMADol (ULTRAM) 50 MG tablet Take 50 mg by mouth every 6 (six) hours as needed for  moderate pain.    [provider]    Family History Family History  Problem Relation Age of Onset  . Hypertension Other   . Hypertension Mother   . Hypertension Father   . Kidney disease Father   . Hypertension Sister   . Diabetes Sister   . Hypertension Brother     Social History Social History  Substance Use Topics  . Smoking status: Never Smoker  . Smokeless tobacco: Never Used  . Alcohol use No     Comment: last drink months ago     Allergies   Diclofenac; Norvasc [amlodipine besylate]; Omeprazole; and Prednisone   Review of Systems Review of Systems    Constitutional: Negative for chills and fever.  HENT: Negative for congestion and facial swelling.   Eyes: Negative for discharge and visual disturbance.  Respiratory: Negative for shortness of breath.   Cardiovascular: Negative for chest pain and palpitations.  Gastrointestinal: Positive for abdominal pain, nausea and vomiting. Negative for diarrhea.  Musculoskeletal: Negative for arthralgias and myalgias.  Skin: Negative for color change and rash.  Neurological: Negative for tremors, syncope and headaches.  Psychiatric/Behavioral: Negative for confusion and dysphoric mood.     Physical Exam Updated Vital Signs BP (!) 140/97   Pulse 66   Temp 98.7 F (37.1 C) (Oral)   Resp 16   Ht 6\' 1"  (1.854 m)   Wt 81.6 kg (180 lb)   SpO2 97%   BMI 23.75 kg/m   Physical Exam  Constitutional: He is oriented to person, place, and time. He appears well-developed and well-nourished.  HENT:  Head: Normocephalic and atraumatic.  Eyes: Pupils are equal, round, and reactive to light. EOM are normal.  Neck: Normal range of motion. Neck supple. No JVD present.  Cardiovascular: Normal rate and regular rhythm.  Exam reveals no gallop and no friction rub.   No murmur heard. Pulmonary/Chest: No respiratory distress. He has no wheezes.  Abdominal: He exhibits no distension and no mass. There is tenderness (mild epigastric). There is no rebound and no guarding.  Musculoskeletal: Normal range of motion.  Neurological: He is alert and oriented to person, place, and time.  Skin: No rash noted. No pallor.  Psychiatric: He has a normal mood and affect. His behavior is normal.  Nursing note and vitals reviewed.    ED Treatments / Results  Labs (all labs ordered are listed, but only abnormal results are displayed) Labs Reviewed  LIPASE, BLOOD - Abnormal; Notable for the following:       Result Value   Lipase 76 (*)    All other components within normal limits  COMPREHENSIVE METABOLIC PANEL -  Abnormal; Notable for the following:    Glucose, Bld 107 (*)    Total Bilirubin 1.6 (*)    All other components within normal limits  CBC  URINALYSIS, ROUTINE W REFLEX MICROSCOPIC    EKG  EKG Interpretation None       Radiology No results found.  Procedures Procedures (including critical care time)  Medications Ordered in ED Medications  promethazine (PHENERGAN) injection 12.5 mg (12.5 mg Intramuscular Given 12/21/16 0455)  HYDROmorphone (DILAUDID) injection 0.5 mg (0.5 mg Intramuscular Given 12/21/16 0456)     Initial Impression / Assessment and Plan / ED Course  I have reviewed the triage vital signs and the nursing notes.  Pertinent labs & imaging results that were available during my care of the patient were reviewed by me and considered in my medical decision making (see chart for  details).     46 yo M with a cc of upper abdominal pain. Hx of pancreatitis and he thinks it feels similar.  Well appearing, non toxic, benign exam.  Given pain meds here, labs with very mild elevation of lipase.  PCP follow up.   6:22 AM:  I have discussed the diagnosis/risks/treatment options with the patient and believe the pt to be eligible for discharge home to follow-up with PCP. We also discussed returning to the ED immediately if new or worsening sx occur. We discussed the sx which are most concerning (e.g., sudden worsening pain, fever, inability to tolerate by mouth) that necessitate immediate return. Medications administered to the patient during their visit and any new prescriptions provided to the patient are listed below.  Medications given during this visit Medications  promethazine (PHENERGAN) injection 12.5 mg (12.5 mg Intramuscular Given 12/21/16 0455)  HYDROmorphone (DILAUDID) injection 0.5 mg (0.5 mg Intramuscular Given 12/21/16 0456)     The patient appears reasonably screen and/or stabilized for discharge and I doubt any other medical condition or other [facility] requiring further  screening, evaluation, or treatment in the ED at this time prior to discharge.    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

## 2017-01-16 ENCOUNTER — Encounter (HOSPITAL_COMMUNITY): Payer: Self-pay | Admitting: *Deleted

## 2017-01-16 ENCOUNTER — Inpatient Hospital Stay (HOSPITAL_COMMUNITY)
Admission: EM | Admit: 2017-01-16 | Discharge: 2017-01-20 | DRG: 440 | Disposition: A | Discharge status: Home / Self Care | Payer: Managed Care, Other (non HMO) | Attending: Internal Medicine | Admitting: Internal Medicine

## 2017-01-16 DIAGNOSIS — K219 Gastro-esophageal reflux disease without esophagitis: Secondary | ICD-10-CM | POA: Diagnosis present

## 2017-01-16 DIAGNOSIS — N200 Calculus of kidney: Secondary | ICD-10-CM | POA: Diagnosis present

## 2017-01-16 DIAGNOSIS — K859 Acute pancreatitis without necrosis or infection, unspecified: Secondary | ICD-10-CM | POA: Diagnosis present

## 2017-01-16 DIAGNOSIS — E876 Hypokalemia: Secondary | ICD-10-CM | POA: Diagnosis present

## 2017-01-16 DIAGNOSIS — K8681 Exocrine pancreatic insufficiency: Secondary | ICD-10-CM | POA: Diagnosis present

## 2017-01-16 DIAGNOSIS — Z87442 Personal history of urinary calculi: Secondary | ICD-10-CM

## 2017-01-16 DIAGNOSIS — Z23 Encounter for immunization: Secondary | ICD-10-CM

## 2017-01-16 DIAGNOSIS — I10 Essential (primary) hypertension: Secondary | ICD-10-CM | POA: Diagnosis present

## 2017-01-16 DIAGNOSIS — F1011 Alcohol abuse, in remission: Secondary | ICD-10-CM

## 2017-01-16 DIAGNOSIS — Z888 Allergy status to other drugs, medicaments and biological substances status: Secondary | ICD-10-CM

## 2017-01-16 DIAGNOSIS — Z8249 Family history of ischemic heart disease and other diseases of the circulatory system: Secondary | ICD-10-CM

## 2017-01-16 DIAGNOSIS — K852 Alcohol induced acute pancreatitis without necrosis or infection: Secondary | ICD-10-CM | POA: Diagnosis not present

## 2017-01-16 DIAGNOSIS — K861 Other chronic pancreatitis: Secondary | ICD-10-CM | POA: Diagnosis present

## 2017-01-16 DIAGNOSIS — J42 Unspecified chronic bronchitis: Secondary | ICD-10-CM | POA: Diagnosis present

## 2017-01-16 DIAGNOSIS — Z79899 Other long term (current) drug therapy: Secondary | ICD-10-CM

## 2017-01-16 DIAGNOSIS — Z79891 Long term (current) use of opiate analgesic: Secondary | ICD-10-CM

## 2017-01-16 LAB — CBC
HCT: 40.6 % (ref 39.0–52.0)
HEMOGLOBIN: 13.5 g/dL (ref 13.0–17.0)
MCH: 32 pg (ref 26.0–34.0)
MCHC: 33.3 g/dL (ref 30.0–36.0)
MCV: 96.2 fL (ref 78.0–100.0)
Platelets: 168 10*3/uL (ref 150–400)
RBC: 4.22 MIL/uL (ref 4.22–5.81)
RDW: 13.8 % (ref 11.5–15.5)
WBC: 8.9 10*3/uL (ref 4.0–10.5)

## 2017-01-16 LAB — COMPREHENSIVE METABOLIC PANEL
ALK PHOS: 62 U/L (ref 38–126)
ALT: 24 U/L (ref 17–63)
ANION GAP: 13 (ref 5–15)
AST: 36 U/L (ref 15–41)
Albumin: 3.8 g/dL (ref 3.5–5.0)
BILIRUBIN TOTAL: 1.1 mg/dL (ref 0.3–1.2)
BUN: 6 mg/dL (ref 6–20)
CALCIUM: 9 mg/dL (ref 8.9–10.3)
CO2: 19 mmol/L — ABNORMAL LOW (ref 22–32)
Chloride: 103 mmol/L (ref 101–111)
Creatinine, Ser: 1.22 mg/dL (ref 0.61–1.24)
GFR calc non Af Amer: >60 mL/min (ref >60)
Glucose, Bld: 108 mg/dL — ABNORMAL HIGH (ref 65–99)
Potassium: 3.3 mmol/L — ABNORMAL LOW (ref 3.5–5.1)
Sodium: 135 mmol/L (ref 135–145)
TOTAL PROTEIN: 7.1 g/dL (ref 6.5–8.1)

## 2017-01-16 LAB — LIPASE, BLOOD: Lipase: 121 U/L — ABNORMAL HIGH (ref 11–51)

## 2017-01-16 NOTE — ED Triage Notes (Signed)
To ED for eval of abd pain. Same pain as his pancreatitis. States he was in ED a couple weeks ago for same and told it was from his ETOH intake. Pt denies ANY ETOH for months. States he can't afford meds.

## 2017-01-17 ENCOUNTER — Encounter (HOSPITAL_COMMUNITY): Payer: Self-pay

## 2017-01-17 ENCOUNTER — Observation Stay (HOSPITAL_COMMUNITY): Payer: Managed Care, Other (non HMO)

## 2017-01-17 DIAGNOSIS — Z87442 Personal history of urinary calculi: Secondary | ICD-10-CM | POA: Diagnosis not present

## 2017-01-17 DIAGNOSIS — K859 Acute pancreatitis without necrosis or infection, unspecified: Secondary | ICD-10-CM | POA: Diagnosis present

## 2017-01-17 DIAGNOSIS — Z8249 Family history of ischemic heart disease and other diseases of the circulatory system: Secondary | ICD-10-CM | POA: Diagnosis not present

## 2017-01-17 DIAGNOSIS — Z23 Encounter for immunization: Secondary | ICD-10-CM | POA: Diagnosis not present

## 2017-01-17 DIAGNOSIS — K219 Gastro-esophageal reflux disease without esophagitis: Secondary | ICD-10-CM | POA: Diagnosis present

## 2017-01-17 DIAGNOSIS — Z888 Allergy status to other drugs, medicaments and biological substances status: Secondary | ICD-10-CM | POA: Diagnosis not present

## 2017-01-17 DIAGNOSIS — K861 Other chronic pancreatitis: Secondary | ICD-10-CM

## 2017-01-17 DIAGNOSIS — Z79891 Long term (current) use of opiate analgesic: Secondary | ICD-10-CM | POA: Diagnosis not present

## 2017-01-17 DIAGNOSIS — N200 Calculus of kidney: Secondary | ICD-10-CM | POA: Diagnosis present

## 2017-01-17 DIAGNOSIS — J42 Unspecified chronic bronchitis: Secondary | ICD-10-CM | POA: Diagnosis present

## 2017-01-17 DIAGNOSIS — Z79899 Other long term (current) drug therapy: Secondary | ICD-10-CM | POA: Diagnosis not present

## 2017-01-17 DIAGNOSIS — I10 Essential (primary) hypertension: Secondary | ICD-10-CM | POA: Diagnosis present

## 2017-01-17 DIAGNOSIS — K852 Alcohol induced acute pancreatitis without necrosis or infection: Secondary | ICD-10-CM | POA: Diagnosis present

## 2017-01-17 DIAGNOSIS — K8681 Exocrine pancreatic insufficiency: Secondary | ICD-10-CM | POA: Diagnosis present

## 2017-01-17 DIAGNOSIS — E876 Hypokalemia: Secondary | ICD-10-CM | POA: Diagnosis present

## 2017-01-17 LAB — BASIC METABOLIC PANEL
Anion gap: 7 (ref 5–15)
BUN: <5 mg/dL — ABNORMAL LOW (ref 6–20)
CALCIUM: 8.1 mg/dL — AB (ref 8.9–10.3)
CHLORIDE: 106 mmol/L (ref 101–111)
CO2: 22 mmol/L (ref 22–32)
CREATININE: 1.02 mg/dL (ref 0.61–1.24)
Glucose, Bld: 100 mg/dL — ABNORMAL HIGH (ref 65–99)
Potassium: 3.5 mmol/L (ref 3.5–5.1)
SODIUM: 135 mmol/L (ref 135–145)

## 2017-01-17 LAB — ETHANOL: Alcohol, Ethyl (B): <10 mg/dL (ref <10)

## 2017-01-17 LAB — HIV ANTIBODY (ROUTINE TESTING W REFLEX): HIV Screen 4th Generation wRfx: NONREACTIVE

## 2017-01-17 MED ORDER — HYDROMORPHONE HCL 1 MG/ML IJ SOLN
1.0000 mg | INTRAMUSCULAR | Status: DC | PRN
  Administered 2017-01-17 – 2017-01-20 (×11): 1 mg via INTRAVENOUS
  Filled 2017-01-17 (×11): qty 1

## 2017-01-17 MED ORDER — ONDANSETRON HCL 4 MG/2ML IJ SOLN
4.0000 mg | Freq: Four times a day (QID) | INTRAMUSCULAR | Status: DC | PRN
  Administered 2017-01-17 – 2017-01-20 (×3): 4 mg via INTRAVENOUS
  Filled 2017-01-17 (×3): qty 2

## 2017-01-17 MED ORDER — HYDROMORPHONE HCL 1 MG/ML IJ SOLN
1.0000 mg | Freq: Once | INTRAMUSCULAR | Status: AC
  Administered 2017-01-17: 1 mg via INTRAVENOUS
  Filled 2017-01-17: qty 1

## 2017-01-17 MED ORDER — ONDANSETRON HCL 4 MG PO TABS: 4.0000 mg | ORAL_TABLET | Freq: Four times a day (QID) | ORAL | Status: DC | PRN

## 2017-01-17 MED ORDER — ACETAMINOPHEN 650 MG RE SUPP: 650.0000 mg | Freq: Four times a day (QID) | RECTAL | Status: DC | PRN

## 2017-01-17 MED ORDER — ACETAMINOPHEN 325 MG PO TABS
650.0000 mg | ORAL_TABLET | Freq: Four times a day (QID) | ORAL | Status: DC | PRN
  Administered 2017-01-17: 650 mg via ORAL
  Filled 2017-01-17: qty 2

## 2017-01-17 MED ORDER — SODIUM CHLORIDE 0.9 % IV BOLUS (SEPSIS)
1000.0000 mL | Freq: Once | INTRAVENOUS | Status: AC
  Administered 2017-01-17: 1000 mL via INTRAVENOUS

## 2017-01-17 MED ORDER — INFLUENZA VAC SPLIT QUAD 0.5 ML IM SUSY
0.5000 mL | PREFILLED_SYRINGE | INTRAMUSCULAR | Status: AC
  Administered 2017-01-18: 0.5 mL via INTRAMUSCULAR
  Filled 2017-01-17: qty 0.5

## 2017-01-17 MED ORDER — HYDROMORPHONE HCL 1 MG/ML IJ SOLN
INTRAMUSCULAR | Status: AC
  Filled 2017-01-17: qty 1

## 2017-01-17 MED ORDER — ENOXAPARIN SODIUM 40 MG/0.4ML ~~LOC~~ SOLN
40.0000 mg | Freq: Every day | SUBCUTANEOUS | Status: DC
  Administered 2017-01-18 – 2017-01-20 (×3): 40 mg via SUBCUTANEOUS
  Filled 2017-01-17 (×4): qty 0.4

## 2017-01-17 MED ORDER — ONDANSETRON HCL 4 MG/2ML IJ SOLN
4.0000 mg | Freq: Once | INTRAMUSCULAR | Status: AC
  Administered 2017-01-17: 4 mg via INTRAVENOUS
  Filled 2017-01-17: qty 2

## 2017-01-17 MED ORDER — IOPAMIDOL (ISOVUE-300) INJECTION 61%
INTRAVENOUS | Status: AC
  Administered 2017-01-17: 100 mL
  Filled 2017-01-17: qty 100

## 2017-01-17 MED ORDER — SODIUM CHLORIDE 0.9 % IV SOLN
INTRAVENOUS | Status: DC
  Administered 2017-01-17 – 2017-01-19 (×6): via INTRAVENOUS

## 2017-01-17 NOTE — ED Notes (Signed)
Attempted report 

## 2017-01-17 NOTE — ED Notes (Signed)
Patient returned from CT

## 2017-01-17 NOTE — Progress Notes (Signed)
PROGRESS NOTE    Kyle Berry  JYN:[id number] DOB: 04-01-1971 DOA: 01/16/2017 PCP: Medicine, [facility]   Chief Complaint  Patient presents with  . Abdominal Pain    Brief Narrative:  HPI on 01/17/2017 by Dr. Lyda Perone Kyle Berry is a 46 y.o. male with medical history significant of EtOH pancreatitis.  This has become recurrent despite stopping all EtOH use.  Of note on his most recent admission and CT scan in July of this year he had evidence of chronic pancreatitis about the pancreatic head as well as acute pancreatitis. Patient presents to the ED with ongoing epigastric pain.  Symptoms have been worse over the past few days but never really went away after the last ED visit on 9/7.  Pain is identical to prior episodes pancreatitis.  Has associated fever, diarrhea, nausea, vomiting.  No hematochezia nor melena.  Worse with palpation, nothing makes symptoms better. Denies recent EtOH use and hasnt used for months. Assessment & Plan   Abdominal pain secondary to Recurrent Acute pancreatitis -Lipase 121 -CT abdomen and pelvis shows changes of acute pancreatitis without apparent complication. No loculated fluid collections. Nonobstructing intrarenal stone left kidney. -Continue supportive care with IV fluids, pain control, NPO  History of alcohol abuse -Denies any recent use of alcohol  Hypokalemia -Resolved, continue to monitor BMP  DVT Prophylaxis  Lovenox  Code Status: Full  Family Communication: None at bedside  Disposition Plan: Observation, continue to monitor   Consultants none  Procedures  none  Antibiotics   Anti-infectives    None      Subjective:   Kyle Berry seen and examined today.  Continues to have some abdominal pain, and one episode of vomiting today. Denies chest pain, shortness of breath, dizziness, headache.     Objective:   Vitals:   01/16/17 2255 01/17/17 0152 01/17/17 0428 01/17/17 0622  BP: 132/87  136/83 (!) 137/93 125/84  Pulse: 79 68 76 72  Resp: 16 15 18 17   Temp:    97.9 F (36.6 C)  TempSrc:    Oral  SpO2: 100% 96% 98% 96%  Weight:      Height:        Intake/Output Summary (Last 24 hours) at 01/17/17 1243 Last data filed at 01/17/17 0521  Gross per 24 hour  Intake             2000 ml  Output                0 ml  Net             2000 ml   Filed Weights   01/16/17 1853  Weight: 83.9 kg (185 lb)    Exam  General: Well developed, well nourished, NAD, appears stated age  HEENT: NCAT, mucous membranes moist.   Cardiovascular: S1 S2 auscultated, no rubs, murmurs or gallops. Regular rate and rhythm.  Respiratory: Clear to auscultation bilaterally with equal chest rise  Abdomen: Soft, Epigastric TTP, nondistended, + bowel sounds  Extremities: warm dry without cyanosis clubbing or edema  Neuro: AAOx3, nonfocal  Psych: Normal affect and demeanor with intact judgement and insight   Data Reviewed: I have personally reviewed following labs and imaging studies  CBC:  Recent Labs Lab 01/16/17 1912  WBC 8.9  HGB 13.5  HCT 40.6  MCV 96.2  PLT 168   Basic Metabolic Panel:  Recent Labs Lab 01/16/17 1912 01/17/17 0830  NA 135 135  K 3.3* 3.5  CL  103 106  CO2 19* 22  GLUCOSE 108* 100*  BUN 6 <5*  CREATININE 1.22 1.02  CALCIUM 9.0 8.1*   GFR: Estimated Creatinine Clearance: 101.2 mL/min (by C-G formula based on SCr of 1.02 mg/dL). Liver Function Tests:  Recent Labs Lab 01/16/17 1912  AST 36  ALT 24  ALKPHOS 62  BILITOT 1.1  PROT 7.1  ALBUMIN 3.8    Recent Labs Lab 01/16/17 1912  LIPASE 121*   No results for input(s): AMMONIA in the last 168 hours. Coagulation Profile: No results for input(s): INR, PROTIME in the last 168 hours. Cardiac Enzymes: No results for input(s): CKTOTAL, CKMB, CKMBINDEX, TROPONINI in the last 168 hours. BNP (last 3 results) No results for input(s): PROBNP in the last 8760 hours. HbA1C: No results for  input(s): HGBA1C in the last 72 hours. CBG: No results for input(s): GLUCAP in the last 168 hours. Lipid Profile: No results for input(s): CHOL, HDL, LDLCALC, TRIG, CHOLHDL, LDLDIRECT in the last 72 hours. Thyroid Function Tests: No results for input(s): TSH, T4TOTAL, FREET4, T3FREE, THYROIDAB in the last 72 hours. Anemia Panel: No results for input(s): VITAMINB12, FOLATE, FERRITIN, TIBC, IRON, RETICCTPCT in the last 72 hours. Urine analysis:    Component Value Date/Time   COLORURINE YELLOW 12/21/2016 0024   APPEARANCEUR CLEAR 12/21/2016 0024   LABSPEC 1.013 12/21/2016 0024   PHURINE 6.0 12/21/2016 0024   GLUCOSEU NEGATIVE 12/21/2016 0024   HGBUR NEGATIVE 12/21/2016 0024   BILIRUBINUR NEGATIVE 12/21/2016 0024   KETONESUR NEGATIVE 12/21/2016 0024   PROTEINUR NEGATIVE 12/21/2016 0024   UROBILINOGEN 0.2 01/20/2015 1643   NITRITE NEGATIVE 12/21/2016 0024   LEUKOCYTESUR NEGATIVE 12/21/2016 0024   Sepsis Labs: @LABRCNTIP (procalcitonin:4,lacticidven:4)  )No results found for this or any previous visit (from the past 240 hour(s)).    Radiology Studies: Ct Abdomen Pelvis W Contrast  Result Date: 01/17/2017 CLINICAL DATA:  Upper abdominal pain for 2 weeks. Nausea and vomiting and diarrhea. History of pancreatitis. EXAM: CT ABDOMEN AND PELVIS WITH CONTRAST TECHNIQUE: Multidetector CT imaging of the abdomen and pelvis was performed using the standard protocol following bolus administration of intravenous contrast. CONTRAST:  ISOVUE-300 IOPAMIDOL (ISOVUE-300) INJECTION 61% COMPARISON:  11/06/2016 CT and MRI. FINDINGS: Lower chest: Somewhat linear areas of atelectasis or scarring in the lung bases. Hepatobiliary: No focal liver abnormality is seen. No gallstones, gallbladder wall thickening, or biliary dilatation. Pancreas: There is infiltration diffusely around the peripancreatic fat particularly in the head region. Mild prominence of the pancreatic duct. Changes are consistent with acute  pancreatitis. Homogeneous enhancement of the pancreatic parenchyma without focal necrosis. No loculated collections. Splenic vein appears patent. Spleen: Normal in size without focal abnormality. Adrenals/Urinary Tract: No adrenal gland nodules. Severe atrophy of the right kidney. Left renal nephrogram is homogeneous and normal. 6 mm stone in the lower pole left kidney. No hydronephrosis or hydroureter. No bladder wall thickening or bladder filling defects. Stomach/Bowel: Stomach is within normal limits. Appendix appears normal. No evidence of bowel wall thickening, distention, or inflammatory changes. Vascular/Lymphatic: No significant vascular findings are present. No enlarged abdominal or pelvic lymph nodes. Reproductive: Prostate is unremarkable. Other: No free air or free fluid in the abdomen. Abdominal wall musculature appears intact. Musculoskeletal: No acute or significant osseous findings. IMPRESSION: 1. Changes of acute pancreatitis without apparent complication. No loculated fluid collections. 2. Nonobstructing intrarenal stone in the left kidney. Severely atrophic right kidney without function demonstrated. 3. Linear areas of atelectasis or scarring in the lung bases. Electronically Signed   By:  Burman Nieves M.D.   On: 01/17/2017 05:33     Scheduled Meds: . enoxaparin (LOVENOX) injection  40 mg Subcutaneous Daily   Continuous Infusions: . sodium chloride 125 mL/hr at 01/17/17 0526     LOS: 0 days   Time Spent in minutes   30 minutes  Kyle Berry D.O. on 01/17/2017 at 12:43 PM  Between 7am to 7pm - Pager - [phone]  After 7pm go to [email or web address] - password [email or web address]  And look for the night coverage person covering for me after hours  [facility] Office  [phone]

## 2017-01-17 NOTE — H&P (Signed)
History and Physical    Kyle Berry TDD:[id number] DOB: 1970/09/01 DOA: 01/16/2017  PCP: Medicine, [facility]  Patient coming from: Home  I have personally briefly reviewed patient's old medical records in [place]  Chief Complaint: Abd pain  HPI: Kyle Berry is a 46 y.o. male with medical history significant of EtOH pancreatitis.  This has become recurrent despite stopping all EtOH use.  Of note on his most recent admission and CT scan in July of this year he had evidence of chronic pancreatitis about the pancreatic head as well as acute pancreatitis.  Patient presents to the ED with ongoing epigastric pain.  Symptoms have been worse over the past few days but never really went away after the last ED visit on 9/7.  Pain is identical to prior episodes pancreatitis.  Has associated fever, diarrhea, nausea, vomiting.  No hematochezia nor melena.  Worse with palpation, nothing makes symptoms better.  Denies recent EtOH use and hasnt used for months.   ED Course: Lipase 131.  Pain got better with dilaudid, but became severe again after trying to drink some water.  Hospitalist asked to admit.   Review of Systems: As per HPI otherwise 10 point review of systems negative.   Past Medical History:  Diagnosis Date  . Acute pancreatitis 11/23/2015  . Chronic bronchitis ([place])   . Chronic lower back pain   . Congenital single kidney   . GERD (gastroesophageal reflux disease)   . High cholesterol   . Hypertension   . Kidney calculi   . Migraines    "once/month" (11/23/2015)  . Nephrolithiasis   . Pneumonia ~ 09/2015  . Renal disorder     Past Surgical History:  Procedure Laterality Date  . NO PAST SURGERIES       reports that he has never smoked. He has never used smokeless tobacco. He reports that he does not drink alcohol or use drugs.  Allergies  Allergen Reactions  . Diclofenac Hives  . Norvasc [Amlodipine Besylate] Other (See Comments)   Unknown  . Omeprazole Other (See Comments)    Unknown  . Prednisone Other (See Comments)    Mood swings    Family History  Problem Relation Age of Onset  . Hypertension Other   . Hypertension Mother   . Hypertension Father   . Kidney disease Father   . Hypertension Sister   . Diabetes Sister   . Hypertension Brother      Prior to Admission medications   Medication Sig Start Date End Date Taking? Authorizing Provider  oxyCODONE-acetaminophen (ROXICET) 5-325 MG tablet Take 1 tablet by mouth every 6 (six) hours as needed. 11/09/16 11/09/17 Yes Verner Mould, MD  traMADol (ULTRAM) 50 MG tablet Take 50 mg by mouth every 6 (six) hours as needed for moderate pain.   Yes [provider]  lipase/protease/amylase (CREON) 12000 units CPEP capsule Take 1 capsule (12,000 Units total) by mouth 3 (three) times daily with meals. Patient not taking: Reported on 11/06/2016 08/30/16   Lovenia Kim, MD  promethazine (PHENERGAN) 12.5 MG tablet Take 1 tablet (12.5 mg total) by mouth every 6 (six) hours as needed for nausea, vomiting or refractory nausea / vomiting. Patient not taking: Reported on 01/17/2017 11/09/16   Verner Mould, MD  promethazine (PHENERGAN) 25 MG suppository Place 1 suppository (25 mg total) rectally every 6 (six) hours as needed for nausea or vomiting. Patient not taking: Reported on 01/17/2017 12/21/16   Deno Etienne, DO  senna-docusate (SENOKOT-S) 8.6-50 MG tablet Take 1 tablet by mouth daily. Patient not taking: Reported on 01/17/2017 08/31/16   Lovenia Kim, MD    Physical Exam: Vitals:   01/16/17 1853 01/16/17 2255 01/17/17 0152 01/17/17 0428  BP: (!) 141/105 132/87 136/83 (!) 137/93  Pulse: 88 79 68 76  Resp:  16 15 18   Temp: 98.4 F (36.9 C)     TempSrc: Oral     SpO2: 99% 100% 96% 98%  Weight: 83.9 kg (185 lb)     Height: 6\' 1"  (1.854 m)       Constitutional: NAD, calm, comfortable Eyes: PERRL, lids and conjunctivae normal ENMT: Mucous  membranes are moist. Posterior pharynx clear of any exudate or lesions.Normal dentition.  Neck: normal, supple, no masses, no thyromegaly Respiratory: clear to auscultation bilaterally, no wheezing, no crackles. Normal respiratory effort. No accessory muscle use.  Cardiovascular: Regular rate and rhythm, no murmurs / rubs / gallops. No extremity edema. 2+ pedal pulses. No carotid bruits.  Abdomen: Epigastric tenderness. Musculoskeletal: no clubbing / cyanosis. No joint deformity upper and lower extremities. Good ROM, no contractures. Normal muscle tone.  Skin: no rashes, lesions, ulcers. No induration Neurologic: CN 2-12 grossly intact. Sensation intact, DTR normal. Strength 5/5 in all 4.  Psychiatric: Normal judgment and insight. Alert and oriented x 3. Normal mood.    Labs on Admission: I have personally reviewed following labs and imaging studies  CBC:  Recent Labs Lab 01/16/17 1912  WBC 8.9  HGB 13.5  HCT 40.6  MCV 96.2  PLT 347   Basic Metabolic Panel:  Recent Labs Lab 01/16/17 1912  NA 135  K 3.3*  CL 103  CO2 19*  GLUCOSE 108*  BUN 6  CREATININE 1.22  CALCIUM 9.0   GFR: Estimated Creatinine Clearance: 84.6 mL/min (by C-G formula based on SCr of 1.22 mg/dL). Liver Function Tests:  Recent Labs Lab 01/16/17 1912  AST 36  ALT 24  ALKPHOS 62  BILITOT 1.1  PROT 7.1  ALBUMIN 3.8    Recent Labs Lab 01/16/17 1912  LIPASE 121*   No results for input(s): AMMONIA in the last 168 hours. Coagulation Profile: No results for input(s): INR, PROTIME in the last 168 hours. Cardiac Enzymes: No results for input(s): CKTOTAL, CKMB, CKMBINDEX, TROPONINI in the last 168 hours. BNP (last 3 results) No results for input(s): PROBNP in the last 8760 hours. HbA1C: No results for input(s): HGBA1C in the last 72 hours. CBG: No results for input(s): GLUCAP in the last 168 hours. Lipid Profile: No results for input(s): CHOL, HDL, LDLCALC, TRIG, CHOLHDL, LDLDIRECT in the  last 72 hours. Thyroid Function Tests: No results for input(s): TSH, T4TOTAL, FREET4, T3FREE, THYROIDAB in the last 72 hours. Anemia Panel: No results for input(s): VITAMINB12, FOLATE, FERRITIN, TIBC, IRON, RETICCTPCT in the last 72 hours. Urine analysis:    Component Value Date/Time   COLORURINE YELLOW 12/21/2016 0024   APPEARANCEUR CLEAR 12/21/2016 0024   LABSPEC 1.013 12/21/2016 0024   PHURINE 6.0 12/21/2016 0024   GLUCOSEU NEGATIVE 12/21/2016 0024   HGBUR NEGATIVE 12/21/2016 0024   BILIRUBINUR NEGATIVE 12/21/2016 0024   KETONESUR NEGATIVE 12/21/2016 0024   PROTEINUR NEGATIVE 12/21/2016 0024   UROBILINOGEN 0.2 01/20/2015 1643   NITRITE NEGATIVE 12/21/2016 0024   LEUKOCYTESUR NEGATIVE 12/21/2016 0024    Radiological Exams on Admission: No results found.  EKG: Independently reviewed.  Assessment/Plan Principal Problem:   Recurrent acute pancreatitis Active Problems:   History of alcohol abuse    1.  Recurrent acute pancreatitis - 1. Dilaudid PRN pain 2. zofran PRN nausea 3. IVF: NS at 125 cc/hr 4. Checking CT abd/pelvis to evaluate pancreas further 2. H/o EtOH abuse - denies any recent use  DVT prophylaxis: Lovenox Code Status: Full Family Communication: No family in room Disposition Plan: Home after admit Consults called: None Admission status: Place in obs   G[place]NER, [facility] Pager [phone]  If 7AM-7PM, please contact day team taking care of patient [email or web address] Password [email or web address]  01/17/2017, 4:57 AM

## 2017-01-17 NOTE — ED Provider Notes (Signed)
[facility] DEPT Provider Note   CSN: [id number] Arrival date & time: 01/16/17  1847     History   Chief Complaint Chief Complaint  Patient presents with  . Abdominal Pain    HPI Kyle Berry is a 46 y.o. male.  The history is provided by the patient.  Abdominal Pain   This is a recurrent problem. The current episode started more than 1 week ago. The problem occurs daily. The problem has been gradually worsening. The pain is located in the epigastric region. The pain is severe. Associated symptoms include fever, diarrhea, nausea and vomiting. Pertinent negatives include hematochezia and melena. The symptoms are aggravated by palpation. Nothing relieves the symptoms.   Patient with known h/o pancreatitis presents with recurrent epigastric abdominal pain It is worse over past several days but never really went away after last ED visit He reports fever/vomiting today (nonbloody) He also reports when pain is severe he is short of breath Denies recent ETOH use Past Medical History:  Diagnosis Date  . Acute pancreatitis 11/23/2015  . Chronic bronchitis ([place])   . Chronic lower back pain   . Congenital single kidney   . GERD (gastroesophageal reflux disease)   . High cholesterol   . Hypertension   . Kidney calculi   . Migraines    "once/month" (11/23/2015)  . Nephrolithiasis   . Pneumonia ~ 09/2015  . Renal disorder     Patient Active Problem List   Diagnosis Date Noted  . Recurrent acute pancreatitis   . History of alcohol abuse   . Hypokalemia   . Non-intractable vomiting with nausea   . Alcohol abuse   . Essential hypertension   . Esophageal reflux   . Pancreatitis 11/23/2015    Past Surgical History:  Procedure Laterality Date  . NO PAST SURGERIES         Home Medications    Prior to Admission medications   Medication Sig Start Date End Date Taking? Authorizing Provider  ibuprofen (ADVIL,MOTRIN) 200 MG tablet Take 400 mg by mouth every 6 (six)  hours as needed for headache or moderate pain.    [provider]  lipase/protease/amylase (CREON) 12000 units CPEP capsule Take 1 capsule (12,000 Units total) by mouth 3 (three) times daily with meals. Patient not taking: Reported on 11/06/2016 08/30/16   Lovenia Kim, MD  Multiple Vitamins-Minerals (MULTIVITAMIN WITH MINERALS) tablet Take 1 tablet by mouth daily.    [provider]  omeprazole (PRILOSEC) 20 MG capsule Take 20 mg by mouth daily as needed (for heart burn).     [provider]  oxyCODONE-acetaminophen (ROXICET) 5-325 MG tablet Take 1 tablet by mouth every 6 (six) hours as needed. 11/09/16 11/09/17  Verner Mould, MD  promethazine (PHENERGAN) 12.5 MG tablet Take 1 tablet (12.5 mg total) by mouth every 6 (six) hours as needed for nausea, vomiting or refractory nausea / vomiting. 11/09/16   Verner Mould, MD  promethazine (PHENERGAN) 25 MG suppository Place 1 suppository (25 mg total) rectally every 6 (six) hours as needed for nausea or vomiting. 12/21/16   Deno Etienne, DO  senna-docusate (SENOKOT-S) 8.6-50 MG tablet Take 1 tablet by mouth daily. 08/31/16   Lovenia Kim, MD  traMADol (ULTRAM) 50 MG tablet Take 50 mg by mouth every 6 (six) hours as needed for moderate pain.    [provider]    Family History Family History  Problem Relation Age of Onset  . Hypertension Other   . Hypertension Mother   .  Hypertension Father   . Kidney disease Father   . Hypertension Sister   . Diabetes Sister   . Hypertension Brother     Social History Social History  Substance Use Topics  . Smoking status: Never Smoker  . Smokeless tobacco: Never Used  . Alcohol use No     Comment: last drink months ago     Allergies   Diclofenac; Norvasc [amlodipine besylate]; Omeprazole; and Prednisone   Review of Systems Review of Systems  Constitutional: Positive for fever.  Respiratory: Positive for shortness of breath.     Gastrointestinal: Positive for abdominal pain, diarrhea, nausea and vomiting. Negative for blood in stool, hematochezia and melena.  All other systems reviewed and are negative.    Physical Exam Updated Vital Signs BP 132/87   Pulse 79   Temp 98.4 F (36.9 C) (Oral)   Resp 16   Ht 1.854 m (6\' 1" )   Wt 83.9 kg (185 lb)   SpO2 100%   BMI 24.41 kg/m   Physical Exam CONSTITUTIONAL: Well developed/well nourished HEAD: Normocephalic/atraumatic EYES: EOMI/PERRL, no icterus ENMT: Mucous membranes moist NECK: supple no meningeal signs SPINE/BACK:entire spine nontender CV: S1/S2 noted, no murmurs/rubs/gallops noted LUNGS: Lungs are clear to auscultation bilaterally, no apparent distress ABDOMEN: soft, moderate epigastric tenderness, no rebound or guarding, bowel sounds noted throughout abdomen GU:no cva tenderness NEURO: Pt is awake/alert/appropriate, moves all extremitiesx4.  No facial droop.   EXTREMITIES: pulses normal/equal, full ROM SKIN: warm, color normal PSYCH: no abnormalities of mood noted, alert and oriented to situation   ED Treatments / Results  Labs (all labs ordered are listed, but only abnormal results are displayed) Labs Reviewed  LIPASE, BLOOD - Abnormal; Notable for the following:       Result Value   Lipase 121 (*)    All other components within normal limits  COMPREHENSIVE METABOLIC PANEL - Abnormal; Notable for the following:    Potassium 3.3 (*)    CO2 19 (*)    Glucose, Bld 108 (*)    All other components within normal limits  CBC    EKG  EKG Interpretation  Date/Time:  Thursday January 17 2017 01:59:55 EDT Ventricular Rate:  77 PR Interval:  166 QRS Duration: 82 QT Interval:  384 QTC Calculation: 434 R Axis:   17 Text Interpretation:  Normal sinus rhythm Early repolarization Normal ECG Confirmed by Ripley Fraise [phone]) on 01/17/2017 2:13:01 AM       Radiology No results found.  Procedures Procedures (including critical care  time)  Medications Ordered in ED Medications  HYDROmorphone (DILAUDID) injection 1 mg (1 mg Intravenous Given 01/17/17 0050)  ondansetron (ZOFRAN) injection 4 mg (4 mg Intravenous Given 01/17/17 0050)  sodium chloride 0.9 % bolus 1,000 mL (0 mLs Intravenous Stopped 01/17/17 0253)  HYDROmorphone (DILAUDID) injection 1 mg (1 mg Intravenous Given 01/17/17 0148)  sodium chloride 0.9 % bolus 1,000 mL (1,000 mLs Intravenous New Bag/Given 01/17/17 0254)  HYDROmorphone (DILAUDID) injection 1 mg (1 mg Intravenous Given 01/17/17 0254)  ondansetron (ZOFRAN) injection 4 mg (4 mg Intravenous Given 01/17/17 0355)  ondansetron (ZOFRAN) injection 4 mg (4 mg Intravenous Given 01/17/17 0430)  HYDROmorphone (DILAUDID) injection 1 mg (1 mg Intravenous Given 01/17/17 0431)     Initial Impression / Assessment and Plan / ED Course  I have reviewed the triage vital signs and the nursing notes.  Pertinent labs  results that were available during my care of the patient were reviewed by me and considered in my medical  decision making (see chart for details).     12:14 AM Pt with recurrent pancreatitis He has focal ABD tenderness Will treat pain and reassess Will defer imaging for now 2:44 AM Pt reports continued pain Will give a 3rd round of meds He has already been doing clear liquids at home but still having ABD pain 4:51 AM Pt with continued abd pain/nausea after drinking fluids Due to persistent pain and uncontrolled pain/nausea, will admit D/w dr Alcario Drought for admission for pain control  Final Clinical Impressions(s) / ED Diagnoses   Final diagnoses:  Alcohol-induced acute pancreatitis without infection or necrosis    New Prescriptions New Prescriptions   No medications on file     Ripley Fraise, MD 01/17/17 [phone]

## 2017-01-17 NOTE — ED Notes (Signed)
Patient given water at this time.  

## 2017-01-17 NOTE — ED Notes (Signed)
Patient transported to CT 

## 2017-01-18 DIAGNOSIS — K861 Other chronic pancreatitis: Secondary | ICD-10-CM

## 2017-01-18 DIAGNOSIS — Z87898 Personal history of other specified conditions: Secondary | ICD-10-CM

## 2017-01-18 DIAGNOSIS — E876 Hypokalemia: Secondary | ICD-10-CM

## 2017-01-18 LAB — COMPREHENSIVE METABOLIC PANEL
ALBUMIN: 3.3 g/dL — AB (ref 3.5–5.0)
ALK PHOS: 49 U/L (ref 38–126)
ALT: 17 U/L (ref 17–63)
ANION GAP: 10 (ref 5–15)
AST: 24 U/L (ref 15–41)
BUN: 5 mg/dL — ABNORMAL LOW (ref 6–20)
CHLORIDE: 101 mmol/L (ref 101–111)
CO2: 25 mmol/L (ref 22–32)
Calcium: 8.7 mg/dL — ABNORMAL LOW (ref 8.9–10.3)
Creatinine, Ser: 1.23 mg/dL (ref 0.61–1.24)
GFR calc Af Amer: >60 mL/min (ref >60)
GFR calc non Af Amer: >60 mL/min (ref >60)
GLUCOSE: 73 mg/dL (ref 65–99)
POTASSIUM: 4.1 mmol/L (ref 3.5–5.1)
SODIUM: 136 mmol/L (ref 135–145)
Total Bilirubin: 1.3 mg/dL — ABNORMAL HIGH (ref 0.3–1.2)
Total Protein: 6.3 g/dL — ABNORMAL LOW (ref 6.5–8.1)

## 2017-01-18 LAB — LIPASE, BLOOD: LIPASE: 56 U/L — AB (ref 11–51)

## 2017-01-18 MED ORDER — FAMOTIDINE 20 MG PO TABS
10.0000 mg | ORAL_TABLET | Freq: Every evening | ORAL | Status: DC | PRN
Start: 2017-01-18 — End: 2017-01-20
  Administered 2017-01-18: 10 mg via ORAL
  Filled 2017-01-18: qty 1

## 2017-01-18 MED ORDER — BOOST / RESOURCE BREEZE PO LIQD
1.0000 | Freq: Three times a day (TID) | ORAL | Status: DC
  Administered 2017-01-18 – 2017-01-20 (×6): 1 via ORAL

## 2017-01-18 NOTE — Progress Notes (Signed)
Initial Nutrition Assessment  DOCUMENTATION CODES:   Not applicable  INTERVENTION:  Boost Breeze po TID, each supplement provides 250 kcal and 9 grams of protein  NUTRITION DIAGNOSIS:   Increased nutrient needs related to chronic illness (pancreatitis) as evidenced by estimated needs.  GOAL:   Patient will meet greater than or equal to 90% of their needs  MONITOR:   PO intake, Supplement acceptance, Weight trends, I & O's  REASON FOR ASSESSMENT:   Malnutrition Screening Tool    ASSESSMENT:   Pt with PMH of congenital single kidney, HTN, high cholesterol, and GERD presents with recurrent acute EtOH pancreatitis. Per chart, CT scan in July revealed evidence of chronic and acute pancreatitis. Pancreatitis is recurrent despite reported cessation of EtOH use.   Pt reports a good appetite and intake PTA. Reports consuming 2-3 meals, with large portions, per day with snacks.  Pt reports being hungry at time of visit.  Pt reports unintentional weight loss related to pancreatitis.  Per chart pt's weight seems to be fairly stable. Pt reports being active at baseline, walking a lot and occasional game of basketball.   Nutrition focused physical exam completed. Findings include no fat depletion, no muscle depletion and no edema. If pt does not meet increased nutrient needs at risk for malnutrition.  Labs and medications reviewed  Diet Order:  Diet clear liquid Room service appropriate? Yes; Fluid consistency: Thin  Skin:  Reviewed, no issues  Last BM:  01/16/17  Height:   Ht Readings from Last 1 Encounters:  01/16/17 6\' 1"  (1.854 m)    Weight:   Wt Readings from Last 1 Encounters:  01/16/17 185 lb (83.9 kg)    Ideal Body Weight:  83.6 kg  BMI:  Body mass index is 24.41 kg/m.  Estimated Nutritional Needs:   Kcal:  1594-5859  Protein:  120-135 grams  Fluid:  >/= 2.3 L/d  EDUCATION NEEDS:   Education needs no appropriate at this time  Parks Ranger, MS,  RDN, LDN 01/18/2017 2:29 PM

## 2017-01-18 NOTE — Progress Notes (Signed)
PROGRESS NOTE    Kyle Berry  ZOX:[id number] DOB: [birth year] DOA: 01/16/2017 PCP: Medicine, [facility]   Chief Complaint  Patient presents with  . Abdominal Pain    Brief Narrative:  HPI on 01/17/2017 by Dr. Lyda Perone Kyle Berry is a 46 y.o. male with medical history significant of EtOH pancreatitis.  This has become recurrent despite stopping all EtOH use.  Of note on his most recent admission and CT scan in July of this year he had evidence of chronic pancreatitis about the pancreatic head as well as acute pancreatitis. Patient presents to the ED with ongoing epigastric pain.  Symptoms have been worse over the past few days but never really went away after the last ED visit on 9/7.  Pain is identical to prior episodes pancreatitis.  Has associated fever, diarrhea, nausea, vomiting.  No hematochezia nor melena.  Worse with palpation, nothing makes symptoms better. Denies recent EtOH use and hasnt used for months. Assessment & Plan   Abdominal pain secondary to Recurrent Acute pancreatitis -Unknown etiology, patient denies any recent alcohol use. CT scan showed no gallstones. Patient not on any medications that could cause pancreatitis.  -Lipase 121 on admission, improving today 56 -last LDL 82 on 08/25/16 -CT abdomen and pelvis shows changes of acute pancreatitis without apparent complication. No loculated fluid collections. Nonobstructing intrarenal stone left kidney. -Continue supportive care with IV fluids, pain control -Will place on Clear liquid diet -will discuss with GI  History of alcohol abuse -Denies any recent use of alcohol  Hypokalemia -Resolved, continue to monitor BMP  DVT Prophylaxis  Lovenox  Code Status: Full  Family Communication: None at bedside  Disposition Plan: Admitted. Will place on clear liquids today and discuss with GI  Consultants Gastroenterology  Procedures  none  Antibiotics   Anti-infectives    None        Subjective:   Kyle Berry seen and examined today.  Continues to have some abdominal pain, but feeling better. No episodes of nausea or vomiting. Would like to try liquids. Denies chest pain, shortness of breath, dizziness, headache.   Objective:   Vitals:   01/17/17 1842 01/17/17 2048 01/18/17 0507 01/18/17 0700  BP: (!) 143/90 123/90 125/88 (!) 148/76  Pulse: 77 71 67 76  Resp: 16 (!) 6 17 18   Temp: 98.3 F (36.8 C) 98.4 F (36.9 C) 98.3 F (36.8 C) 98.2 F (36.8 C)  TempSrc: Oral Oral Oral Oral  SpO2: 99% 99% 98% 98%  Weight:      Height:        Intake/Output Summary (Last 24 hours) at 01/18/17 1130 Last data filed at 01/18/17 0600  Gross per 24 hour  Intake             1000 ml  Output                0 ml  Net             1000 ml   Filed Weights   01/16/17 1853  Weight: 83.9 kg (185 lb)   Exam  General: Well developed, well nourished, NAD, appears stated age  HEENT: NCAT,  mucous membranes moist.   Cardiovascular: S1 S2 auscultated, no murmurs, RRR  Respiratory: Clear to auscultation bilaterally with equal chest rise  Abdomen: Soft, Epigastric/LUQ TTP, nondistended, + bowel sounds  Extremities: warm dry without cyanosis clubbing or edema  Neuro: AAOx3, nonfocal   Psych: Appropriate mood and affect  Data Reviewed:  I have personally reviewed following labs and imaging studies  CBC:  Recent Labs Lab 01/16/17 1912  WBC 8.9  HGB 13.5  HCT 40.6  MCV 96.2  PLT 168   Basic Metabolic Panel:  Recent Labs Lab 01/16/17 1912 01/17/17 0830 01/18/17 0412  NA 135 135 136  K 3.3* 3.5 4.1  CL 103 106 101  CO2 19* 22 25  GLUCOSE 108* 100* 73  BUN 6 <5* 5*  CREATININE 1.22 1.02 1.23  CALCIUM 9.0 8.1* 8.7*   GFR: Estimated Creatinine Clearance: 83.9 mL/min (by C-G formula based on SCr of 1.23 mg/dL). Liver Function Tests:  Recent Labs Lab 01/16/17 1912 01/18/17 0412  AST 36 24  ALT 24 17  ALKPHOS 62 49  BILITOT 1.1 1.3*  PROT 7.1  6.3*  ALBUMIN 3.8 3.3*    Recent Labs Lab 01/16/17 1912 01/18/17 0412  LIPASE 121* 56*   No results for input(s): AMMONIA in the last 168 hours. Coagulation Profile: No results for input(s): INR, PROTIME in the last 168 hours. Cardiac Enzymes: No results for input(s): CKTOTAL, CKMB, CKMBINDEX, TROPONINI in the last 168 hours. BNP (last 3 results) No results for input(s): PROBNP in the last 8760 hours. HbA1C: No results for input(s): HGBA1C in the last 72 hours. CBG: No results for input(s): GLUCAP in the last 168 hours. Lipid Profile: No results for input(s): CHOL, HDL, LDLCALC, TRIG, CHOLHDL, LDLDIRECT in the last 72 hours. Thyroid Function Tests: No results for input(s): TSH, T4TOTAL, FREET4, T3FREE, THYROIDAB in the last 72 hours. Anemia Panel: No results for input(s): VITAMINB12, FOLATE, FERRITIN, TIBC, IRON, RETICCTPCT in the last 72 hours. Urine analysis:    Component Value Date/Time   COLORURINE YELLOW 12/21/2016 0024   APPEARANCEUR CLEAR 12/21/2016 0024   LABSPEC 1.013 12/21/2016 0024   PHURINE 6.0 12/21/2016 0024   GLUCOSEU NEGATIVE 12/21/2016 0024   HGBUR NEGATIVE 12/21/2016 0024   BILIRUBINUR NEGATIVE 12/21/2016 0024   KETONESUR NEGATIVE 12/21/2016 0024   PROTEINUR NEGATIVE 12/21/2016 0024   UROBILINOGEN 0.2 01/20/2015 1643   NITRITE NEGATIVE 12/21/2016 0024   LEUKOCYTESUR NEGATIVE 12/21/2016 0024   Sepsis Labs: @LABRCNTIP (procalcitonin:4,lacticidven:4)  )No results found for this or any previous visit (from the past 240 hour(s)).    Radiology Studies: Ct Abdomen Pelvis W Contrast  Result Date: 01/17/2017 CLINICAL DATA:  Upper abdominal pain for 2 weeks. Nausea and vomiting and diarrhea. History of pancreatitis. EXAM: CT ABDOMEN AND PELVIS WITH CONTRAST TECHNIQUE: Multidetector CT imaging of the abdomen and pelvis was performed using the standard protocol following bolus administration of intravenous contrast. CONTRAST:  ISOVUE-300 IOPAMIDOL  (ISOVUE-300) INJECTION 61% COMPARISON:  11/06/2016 CT and MRI. FINDINGS: Lower chest: Somewhat linear areas of atelectasis or scarring in the lung bases. Hepatobiliary: No focal liver abnormality is seen. No gallstones, gallbladder wall thickening, or biliary dilatation. Pancreas: There is infiltration diffusely around the peripancreatic fat particularly in the head region. Mild prominence of the pancreatic duct. Changes are consistent with acute pancreatitis. Homogeneous enhancement of the pancreatic parenchyma without focal necrosis. No loculated collections. Splenic vein appears patent. Spleen: Normal in size without focal abnormality. Adrenals/Urinary Tract: No adrenal gland nodules. Severe atrophy of the right kidney. Left renal nephrogram is homogeneous and normal. 6 mm stone in the lower pole left kidney. No hydronephrosis or hydroureter. No bladder wall thickening or bladder filling defects. Stomach/Bowel: Stomach is within normal limits. Appendix appears normal. No evidence of bowel wall thickening, distention, or inflammatory changes. Vascular/Lymphatic: No significant vascular findings are present. No  enlarged abdominal or pelvic lymph nodes. Reproductive: Prostate is unremarkable. Other: No free air or free fluid in the abdomen. Abdominal wall musculature appears intact. Musculoskeletal: No acute or significant osseous findings. IMPRESSION: 1. Changes of acute pancreatitis without apparent complication. No loculated fluid collections. 2. Nonobstructing intrarenal stone in the left kidney. Severely atrophic right kidney without function demonstrated. 3. Linear areas of atelectasis or scarring in the lung bases. Electronically Signed   By: Burman Nieves M.D.   On: 01/17/2017 05:33     Scheduled Meds: . enoxaparin (LOVENOX) injection  40 mg Subcutaneous Daily   Continuous Infusions: . sodium chloride 125 mL/hr at 01/18/17 0831     LOS: 1 day   Time Spent in minutes   30 minutes  Leyan Branden,  Myrakle Wingler D.O. on 01/18/2017 at 11:30 AM  Between 7am to 7pm - Pager - [phone]  After 7pm go to [email or web address] - password [email or web address]  And look for the night coverage person covering for me after hours  [facility] Office  [phone]

## 2017-01-19 ENCOUNTER — Encounter (HOSPITAL_COMMUNITY): Payer: Self-pay | Admitting: *Deleted

## 2017-01-19 DIAGNOSIS — K852 Alcohol induced acute pancreatitis without necrosis or infection: Principal | ICD-10-CM

## 2017-01-19 LAB — BASIC METABOLIC PANEL
ANION GAP: 7 (ref 5–15)
BUN: 6 mg/dL (ref 6–20)
CO2: 27 mmol/L (ref 22–32)
Calcium: 8.6 mg/dL — ABNORMAL LOW (ref 8.9–10.3)
Chloride: 104 mmol/L (ref 101–111)
Creatinine, Ser: 1.08 mg/dL (ref 0.61–1.24)
GFR calc Af Amer: >60 mL/min (ref >60)
GLUCOSE: 111 mg/dL — AB (ref 65–99)
POTASSIUM: 3.8 mmol/L (ref 3.5–5.1)
Sodium: 138 mmol/L (ref 135–145)

## 2017-01-19 LAB — CBC
HEMATOCRIT: 37.3 % — AB (ref 39.0–52.0)
HEMOGLOBIN: 12.1 g/dL — AB (ref 13.0–17.0)
MCH: 31.3 pg (ref 26.0–34.0)
MCHC: 32.4 g/dL (ref 30.0–36.0)
MCV: 96.6 fL (ref 78.0–100.0)
PLATELETS: 160 10*3/uL (ref 150–400)
RBC: 3.86 MIL/uL — AB (ref 4.22–5.81)
RDW: 13.7 % (ref 11.5–15.5)
WBC: 6.1 10*3/uL (ref 4.0–10.5)

## 2017-01-19 NOTE — Progress Notes (Signed)
PROGRESS NOTE    Kyle Berry  BJY:[id number] DOB: 1970/09/08 DOA: 01/16/2017 PCP: Medicine, [facility]   Chief Complaint  Patient presents with  . Abdominal Pain    Brief Narrative:  HPI on 01/17/2017 by Dr. Lyda Perone Kyle Berry is a 46 y.o. male with medical history significant of EtOH pancreatitis.  This has become recurrent despite stopping all EtOH use.  Of note on his most recent admission and CT scan in July of this year he had evidence of chronic pancreatitis about the pancreatic head as well as acute pancreatitis. Patient presents to the ED with ongoing epigastric pain.  Symptoms have been worse over the past few days but never really went away after the last ED visit on 9/7.  Pain is identical to prior episodes pancreatitis.  Has associated fever, diarrhea, nausea, vomiting.  No hematochezia nor melena.  Worse with palpation, nothing makes symptoms better. Denies recent EtOH use and hasnt used for months. Assessment & Plan   Abdominal pain secondary to Recurrent Acute pancreatitis -Unknown etiology, patient denies any recent alcohol use. CT scan showed no gallstones. Patient not on any medications that could cause pancreatitis.  -Lipase 121 on admission, improving today 56 -last LDL 82 on 08/25/16 -CT abdomen and pelvis shows changes of acute pancreatitis without apparent complication. No loculated fluid collections. Nonobstructing intrarenal stone left kidney. -Continue supportive care with IV fluids, pain control -Transitioned to soft diet, however, experienced more pain. Will place back on full liquid diet -Discussed with GI, outpatient follow up appt made  History of alcohol abuse -Denies any recent use of alcohol  Hypokalemia -Resolved, continue to monitor BMP  DVT Prophylaxis  Lovenox  Code Status: Full  Family Communication: None at bedside  Disposition Plan: Admitted. Will place back on full  liquids  Consultants Gastroenterology  Procedures  none  Antibiotics   Anti-infectives    None      Subjective:   Kyle Berry seen and examined today.  Did well with liquid diet and wanted to try soft diet, however, experienced pain. Denies further nausea, vomiting. Having abdominal pain. Denies chest pain, shortness of breath, dizziness, headache.   Objective:   Vitals:   01/18/17 1700 01/18/17 2149 01/19/17 0409 01/19/17 0933  BP: (!) 148/88 134/78 130/79 132/80  Pulse: 81 75 63 69  Resp: 18 18 18 18   Temp: 98.1 F (36.7 C) 98.2 F (36.8 C) 98.4 F (36.9 C) 98.4 F (36.9 C)  TempSrc: Oral Oral Oral Oral  SpO2: 98% 99% 98% 98%  Weight:  83.6 kg (184 lb 3.2 oz)    Height:        Intake/Output Summary (Last 24 hours) at 01/19/17 1312 Last data filed at 01/19/17 1000  Gross per 24 hour  Intake           4528.5 ml  Output             1201 ml  Net           3327.5 ml   Filed Weights   01/16/17 1853 01/18/17 2149  Weight: 83.9 kg (185 lb) 83.6 kg (184 lb 3.2 oz)   Exam  General: Well developed, well nourished, NAD, appears stated age  HEENT: NCAT, mucous membranes moist.   Cardiovascular: S1 S2 auscultated, RRR, no murmurs  Respiratory: Clear to auscultation bilaterally with equal chest rise  Abdomen: Soft, RUQ/LLQ/Epigastric TTP, nondistended, + bowel sounds  Extremities: warm dry without cyanosis clubbing or edema  Neuro:  AAOx3, nonfocal   Psych: Appropriate mood and affect, pleasant  Data Reviewed: I have personally reviewed following labs and imaging studies  CBC:  Recent Labs Lab 01/16/17 1912 01/19/17 0318  WBC 8.9 6.1  HGB 13.5 12.1*  HCT 40.6 37.3*  MCV 96.2 96.6  PLT 168 160   Basic Metabolic Panel:  Recent Labs Lab 01/16/17 1912 01/17/17 0830 01/18/17 0412 01/19/17 0318  NA 135 135 136 138  K 3.3* 3.5 4.1 3.8  CL 103 106 101 104  CO2 19* 22 25 27   GLUCOSE 108* 100* 73 111*  BUN 6 <5* 5* 6  CREATININE 1.22 1.02 1.23  1.08  CALCIUM 9.0 8.1* 8.7* 8.6*   GFR: Estimated Creatinine Clearance: 95.6 mL/min (by C-G formula based on SCr of 1.08 mg/dL). Liver Function Tests:  Recent Labs Lab 01/16/17 1912 01/18/17 0412  AST 36 24  ALT 24 17  ALKPHOS 62 49  BILITOT 1.1 1.3*  PROT 7.1 6.3*  ALBUMIN 3.8 3.3*    Recent Labs Lab 01/16/17 1912 01/18/17 0412  LIPASE 121* 56*   No results for input(s): AMMONIA in the last 168 hours. Coagulation Profile: No results for input(s): INR, PROTIME in the last 168 hours. Cardiac Enzymes: No results for input(s): CKTOTAL, CKMB, CKMBINDEX, TROPONINI in the last 168 hours. BNP (last 3 results) No results for input(s): PROBNP in the last 8760 hours. HbA1C: No results for input(s): HGBA1C in the last 72 hours. CBG: No results for input(s): GLUCAP in the last 168 hours. Lipid Profile: No results for input(s): CHOL, HDL, LDLCALC, TRIG, CHOLHDL, LDLDIRECT in the last 72 hours. Thyroid Function Tests: No results for input(s): TSH, T4TOTAL, FREET4, T3FREE, THYROIDAB in the last 72 hours. Anemia Panel: No results for input(s): VITAMINB12, FOLATE, FERRITIN, TIBC, IRON, RETICCTPCT in the last 72 hours. Urine analysis:    Component Value Date/Time   COLORURINE YELLOW 12/21/2016 0024   APPEARANCEUR CLEAR 12/21/2016 0024   LABSPEC 1.013 12/21/2016 0024   PHURINE 6.0 12/21/2016 0024   GLUCOSEU NEGATIVE 12/21/2016 0024   HGBUR NEGATIVE 12/21/2016 0024   BILIRUBINUR NEGATIVE 12/21/2016 0024   KETONESUR NEGATIVE 12/21/2016 0024   PROTEINUR NEGATIVE 12/21/2016 0024   UROBILINOGEN 0.2 01/20/2015 1643   NITRITE NEGATIVE 12/21/2016 0024   LEUKOCYTESUR NEGATIVE 12/21/2016 0024   Sepsis Labs: @LABRCNTIP (procalcitonin:4,lacticidven:4)  )No results found for this or any previous visit (from the past 240 hour(s)).    Radiology Studies: No results found.   Scheduled Meds: . enoxaparin (LOVENOX) injection  40 mg Subcutaneous Daily  . feeding supplement  1 Container  Oral TID BM   Continuous Infusions:    LOS: 2 days   Time Spent in minutes   30 minutes  Kyle Berry D.O. on 01/19/2017 at 1:12 PM  Between 7am to 7pm - Pager - [phone]  After 7pm go to [email or web address] - password [email or web address]  And look for the night coverage person covering for me after hours  [facility] Office  [phone]

## 2017-01-19 NOTE — Care Management Note (Signed)
Case Management Note  Patient Details  Name: Kyle Berry MRN: [id number] Date of Birth: [birth year]  Subjective/Objective:                 Patient coming from home, lives with wife. Admitted for Acute on Chronic Pancreatitis, H/O ETOH abuse.  PCP TAPM  Action/Plan:  CM will continue to follow for DC planning, may need MATCH depending on Rx at DC.   Expected Discharge Date:                  Expected Discharge Plan:  Home/Self Care  In-House Referral:  Clinical Social Work  Discharge planning Services  CM Consult  Post Acute Care Choice:    Choice offered to:     DME Arranged:    DME Agency:     HH Arranged:    HH Agency:     Status of Service:  In process, will continue to follow  If discussed at Long Length of Stay Meetings, dates discussed:    Additional Comments:  Carles Collet, RN 01/19/2017, 9:17 AM

## 2017-01-19 NOTE — Discharge Summary (Signed)
Physician Discharge Summary  Kyle Berry PJK:[id number] DOB: 1971-03-04 DOA: 01/16/2017  PCP: [facility], [facility]  Admit date: 01/16/2017 Discharge date: 01/20/2017  Time spent: 45 minutes  Recommendations for Outpatient Follow-up:  Patient will be discharged to home.  Patient will need to follow up with primary care provider within one week of discharge.  Follow up with gastroenterology on 01/23/2017 at 1:30pm. Patient should continue medications as prescribed.  Patient should follow a soft, low fat diet.   Discharge Diagnoses:  Abdominal pain secondary to Recurrent Acute pancreatitis History of alcohol abuse Hypokalemia  Discharge Condition: Stable  Diet recommendation: soft  Filed Weights   01/16/17 1853 01/18/17 2149 01/19/17 2055  Weight: 83.9 kg (185 lb) 83.6 kg (184 lb 3.2 oz) 78.8 kg (173 lb 11.2 oz)    History of present illness:  on 01/17/2017 by Dr. Angelica Chessman a 46 y.o.malewith medical history significant of EtOH pancreatitis. This has become recurrent despite stopping all EtOH use. Of note on his most recent admission and CT scan in July of this year he had evidence of chronic pancreatitis about the pancreatic head as well as acute pancreatitis. Patient presents to the ED with ongoing epigastric pain. Symptoms have been worse over the past few days but never really went away after the last ED visit on 9/7. Pain is identical to prior episodes pancreatitis. Has associated fever, diarrhea, nausea, vomiting. No hematochezia nor melena. Worse with palpation, nothing makes symptoms better. Denies recent EtOHuse and hasntused for months.  Hospital Course:  Abdominal pain secondary to Recurrent Acute pancreatitis -Unknown etiology, patient denies any recent alcohol use. CT scan showed no gallstones. Patient not on any medications that could cause pancreatitis.  -Lipase 121 on admission, improving today 56 -last LDL 82 on  08/25/16 -CT abdomen and pelvis shows changes of acute pancreatitis without apparent complication. No loculated fluid collections. Nonobstructing intrarenal stone left kidney. -placed on full liquid diet/soft diet- instructed to avoid fatty foods, dairy -discussed with GI, will follow up with the patient on 01/23/2017   History of alcohol abuse -Denies any recent use of alcohol- last use was a few months ago  Hypokalemia -Resolved, continue to monitor BMP  Procedures: None  Consultations: Gastroenterology via phone  Discharge Exam: Vitals:   01/19/17 2055 01/20/17 0611  BP: (!) 147/96 123/82  Pulse: 68 67  Resp: 17 16  Temp: 98.4 F (36.9 C) 98.4 F (36.9 C)  SpO2: 96% 99%   Patient has abdominal soreness which worsens with movement. Denies further nausea or vomiting. Denies chest pain, shortness of breath, dizziness, headache.     General: Well developed, well nourished, NAD, appears stated age  25: NCAT, mucous membranes moist.  Cardiovascular: S1 S2 auscultated, RRR, no murmurs  Respiratory: Clear to auscultation bilaterally with equal chest rise  Abdomen: Soft, mild Epigastric TTP, nondistended, + bowel sounds  Extremities: warm dry without cyanosis clubbing or edema  Neuro: AAOx3, nonfocal  Psych: Normal affect and demeanor, pleasant  Discharge Instructions Discharge Instructions    Discharge instructions    Complete by:  As directed    Patient will be discharged to home.  Patient will need to follow up with primary care provider within one week of discharge.  Follow up with gastroenterology on 01/23/2017 at 1:30pm. Patient should continue medications as prescribed.  Patient should follow a soft, low fat diet.     Current Discharge Medication List    START taking these medications   Details  famotidine (PEPCID) 10 MG tablet Take 1 tablet (10 mg total) by mouth at bedtime as needed for heartburn or indigestion. Qty: 30 tablet, Refills: 0    feeding  supplement (BOOST / RESOURCE BREEZE) LIQD Take 1 Container by mouth 3 (three) times daily between meals. Qty: 30 Container, Refills: 0      CONTINUE these medications which have CHANGED   Details  oxyCODONE-acetaminophen (ROXICET) 5-325 MG tablet Take 1 tablet by mouth every 6 (six) hours as needed. Qty: 20 tablet, Refills: 0    promethazine (PHENERGAN) 12.5 MG tablet Take 1 tablet (12.5 mg total) by mouth every 6 (six) hours as needed for nausea, vomiting or refractory nausea / vomiting. Qty: 30 tablet, Refills: 0      CONTINUE these medications which have NOT CHANGED   Details  traMADol (ULTRAM) 50 MG tablet Take 50 mg by mouth every 6 (six) hours as needed for moderate pain.      STOP taking these medications     lipase/protease/amylase (CREON) 12000 units CPEP capsule      promethazine (PHENERGAN) 25 MG suppository      senna-docusate (SENOKOT-S) 8.6-50 MG tablet        Allergies  Allergen Reactions  . Diclofenac Hives  . Norvasc [Amlodipine Besylate] Other (See Comments)    Unknown  . Omeprazole Other (See Comments)    Unknown  . Prednisone Other (See Comments)    Mood swings   Follow-up Information    [facility]. Schedule an appointment as soon as possible for a visit in 1 week(s).   Why:  Hospital follow up Contact information: [place] [address] [phone]        Willia Craze, NP. Go on 01/23/2017.   Specialty:  Gastroenterology Why:  at 1:30pm Contact information: [place] [address] [phone]            The results of significant diagnostics from this hospitalization (including imaging, microbiology, ancillary and laboratory) are listed below for reference.    Significant Diagnostic Studies: Ct Abdomen Pelvis W Contrast  Result Date: 01/17/2017 CLINICAL DATA:  Upper abdominal pain for 2 weeks. Nausea and vomiting and diarrhea. History of pancreatitis. EXAM: CT ABDOMEN  AND PELVIS WITH CONTRAST TECHNIQUE: Multidetector CT imaging of the abdomen and pelvis was performed using the standard protocol following bolus administration of intravenous contrast. CONTRAST:  149mL ISOVUE-300 IOPAMIDOL (ISOVUE-300) INJECTION 61% COMPARISON:  11/06/2016 CT and MRI. FINDINGS: Lower chest: Somewhat linear areas of atelectasis or scarring in the lung bases. Hepatobiliary: No focal liver abnormality is seen. No gallstones, gallbladder wall thickening, or biliary dilatation. Pancreas: There is infiltration diffusely around the peripancreatic fat particularly in the head region. Mild prominence of the pancreatic duct. Changes are consistent with acute pancreatitis. Homogeneous enhancement of the pancreatic parenchyma without focal necrosis. No loculated collections. Splenic vein appears patent. Spleen: Normal in size without focal abnormality. Adrenals/Urinary Tract: No adrenal gland nodules. Severe atrophy of the right kidney. Left renal nephrogram is homogeneous and normal. 6 mm stone in the lower pole left kidney. No hydronephrosis or hydroureter. No bladder wall thickening or bladder filling defects. Stomach/Bowel: Stomach is within normal limits. Appendix appears normal. No evidence of bowel wall thickening, distention, or inflammatory changes. Vascular/Lymphatic: No significant vascular findings are present. No enlarged abdominal or pelvic lymph nodes. Reproductive: Prostate is unremarkable. Other: No free air or free fluid in the abdomen. Abdominal wall musculature appears intact. Musculoskeletal: No acute or  significant osseous findings. IMPRESSION: 1. Changes of acute pancreatitis without apparent complication. No loculated fluid collections. 2. Nonobstructing intrarenal stone in the left kidney. Severely atrophic right kidney without function demonstrated. 3. Linear areas of atelectasis or scarring in the lung bases. Electronically Signed   By: Lucienne Capers M.D.   On: 01/17/2017 05:33     Microbiology: No results found for this or any previous visit (from the past 240 hour(s)).   Labs: Basic Metabolic Panel:  Recent Labs Lab 01/16/17 1912 01/17/17 0830 01/18/17 0412 01/19/17 0318  NA 135 135 136 138  K 3.3* 3.5 4.1 3.8  CL 103 106 101 104  CO2 19* 22 25 27   GLUCOSE 108* 100* 73 111*  BUN 6 <5* 5* 6  CREATININE 1.22 1.02 1.23 1.08  CALCIUM 9.0 8.1* 8.7* 8.6*   Liver Function Tests:  Recent Labs Lab 01/16/17 1912 01/18/17 0412  AST 36 24  ALT 24 17  ALKPHOS 62 49  BILITOT 1.1 1.3*  PROT 7.1 6.3*  ALBUMIN 3.8 3.3*    Recent Labs Lab 01/16/17 1912 01/18/17 0412  LIPASE 121* 56*   No results for input(s): AMMONIA in the last 168 hours. CBC:  Recent Labs Lab 01/16/17 1912 01/19/17 0318  WBC 8.9 6.1  HGB 13.5 12.1*  HCT 40.6 37.3*  MCV 96.2 96.6  PLT 168 160   Cardiac Enzymes: No results for input(s): CKTOTAL, CKMB, CKMBINDEX, TROPONINI in the last 168 hours. BNP: BNP (last 3 results) No results for input(s): BNP in the last 8760 hours.  ProBNP (last 3 results) No results for input(s): PROBNP in the last 8760 hours.  CBG: No results for input(s): GLUCAP in the last 168 hours.     SignedCristal Ford  [facility] 01/20/2017, 11:49 AM

## 2017-01-20 DIAGNOSIS — R109 Unspecified abdominal pain: Secondary | ICD-10-CM

## 2017-01-20 MED ORDER — PROMETHAZINE HCL 12.5 MG PO TABS: 12.5000 mg | ORAL_TABLET | Freq: Four times a day (QID) | ORAL | 0 refills | Status: DC | PRN

## 2017-01-20 MED ORDER — FAMOTIDINE 10 MG PO TABS: 10.0000 mg | ORAL_TABLET | Freq: Every evening | ORAL | 0 refills | Status: DC | PRN

## 2017-01-20 MED ORDER — OXYCODONE-ACETAMINOPHEN 5-325 MG PO TABS: 1.0000 | ORAL_TABLET | Freq: Four times a day (QID) | ORAL | 0 refills | Status: DC | PRN

## 2017-01-20 MED ORDER — BOOST / RESOURCE BREEZE PO LIQD
1.0000 | Freq: Three times a day (TID) | ORAL | 0 refills | Status: DC
Start: 2017-01-20 — End: 2019-03-12

## 2017-01-20 NOTE — Discharge Instructions (Signed)
Acute Pancreatitis  Acute pancreatitis is a condition in which the pancreas suddenly becomes irritated and swollen (has inflammation). The pancreas is a gland that is located behind the stomach. It produces enzymes that help to digest food. The pancreas also releases the hormones glucagon and insulin, which help to regulate blood sugar. Damage to the pancreas occurs when the digestive enzymes from the pancreas are activated before they are released into the intestine.  Most acute attacks last a couple of days and can cause serious problems. Some people become dehydrated and develop low blood pressure. In severe cases, bleeding into the pancreas can lead to shock and can be life-threatening. The lungs, heart, and kidneys may fail.  What are the causes?  The most common causes of this condition are:  · Alcohol abuse.  · Gallstones.    Other causes include:  · Certain medicines.  · Exposure to certain chemicals.  · Infection.  · Damage caused by an accident (trauma).  · Abdominal surgery.    In some cases, the cause may not be known.  What are the signs or symptoms?  Symptoms of this condition include:  · Pain in the upper abdomen that may radiate to the back.  · Tenderness and swelling of the abdomen.  · Nausea and vomiting.    How is this diagnosed?  This condition may be diagnosed based on:  · A physical exam.  · Blood tests.  · Imaging tests, such as X-rays, CT scans, or an ultrasound of the abdomen.    How is this treated?  Treatment for this condition usually requires a stay in the hospital. Treatment may include:  · Pain medicine.  · Fluid replacement through an IV tube.  · Placing a tube in the stomach to remove stomach contents and to control vomiting (NG tube, or nasogastric tube).  · Not eating for 3-4 days. This gives the pancreas a rest, because enzymes are not being produced that can cause further damage.  · Antibiotic medicines, if your condition is caused by an infection.  · Surgery on the pancreas or  gallbladder.    Follow these instructions at home:  Eating and drinking  · Follow instructions from your health care provider about diet. This may involve avoiding alcohol and decreasing the amount of fat in your diet.  · Eat smaller, more frequent meals. This reduces the amount of digestive fluids that the pancreas produces.  · Drink enough fluid to keep your urine clear or pale yellow.  · Do not drink alcohol if it caused your condition.  General instructions  · Take over-the-counter and prescription medicines only as told by your health care provider.  · Do not use any tobacco products, such as cigarettes, chewing tobacco, and e-cigarettes. If you need help quitting, ask your health care provider.  · Get plenty of rest.  · If directed, check your blood sugar at home as told by your health care provider.  · Keep all follow-up visits as told by your health care provider. This is important.  Contact a health care provider if:  · You do not recover as quickly as expected.  · You develop new or worsening symptoms.  · You have persistent pain, weakness, or nausea.  · You recover and then have another episode of pain.  · You have a fever.  Get help right away if:  · You cannot eat or keep fluids down.  · Your pain becomes severe.  · Your skin or the   white part of your eyes turns yellow (jaundice).  · You vomit.  · You feel dizzy or you faint.  · Your blood sugar is high (over 300 mg/dL).  This information is not intended to replace advice given to you by your health care provider. Make sure you discuss any questions you have with your health care provider.  Document Released: 04/02/2005 Document Revised: 08/10/2015 Document Reviewed: 01/04/2015  Elsevier Interactive Patient Education © 2018 [employer]

## 2017-01-23 ENCOUNTER — Ambulatory Visit: Payer: Self-pay | Admitting: Nurse Practitioner

## 2017-01-25 ENCOUNTER — Ambulatory Visit: Payer: Self-pay | Admitting: Nurse Practitioner

## 2017-01-31 ENCOUNTER — Ambulatory Visit (INDEPENDENT_AMBULATORY_CARE_PROVIDER_SITE_OTHER): Payer: Self-pay | Admitting: Nurse Practitioner

## 2017-01-31 ENCOUNTER — Encounter: Payer: Self-pay | Admitting: Nurse Practitioner

## 2017-01-31 VITALS — BP 136/90 | HR 78 | Ht 73.0 in | Wt 178.0 lb

## 2017-01-31 DIAGNOSIS — K859 Acute pancreatitis without necrosis or infection, unspecified: Secondary | ICD-10-CM

## 2017-01-31 DIAGNOSIS — K861 Other chronic pancreatitis: Secondary | ICD-10-CM

## 2017-01-31 MED ORDER — PANCRELIPASE (LIP-PROT-AMYL) 36000-114000 UNITS PO CPEP: 36000.0000 [IU] | ORAL_CAPSULE | ORAL | 3 refills | Status: DC

## 2017-01-31 NOTE — Patient Instructions (Addendum)
If you are age 47 or older, your body mass index should be between 23-30. Your Body mass index is 23.48 kg/m. If this is out of the aforementioned range listed, please consider follow up with your Primary Care Provider.  If you are age 39 or younger, your body mass index should be between 19-25. Your Body mass index is 23.48 kg/m. If this is out of the aformentioned range listed, please consider follow up with your Primary Care Provider.   We have sent the following medications to your pharmacy for you to pick up at your convenience: Creon - [facility] will contact you. Samples given.  No alcohol.  Continue low fat diet.  Follow up in three months. Schedule not available at this time.  Thank you for choosing me and [facility].   Tye Savoy, NP

## 2017-01-31 NOTE — Progress Notes (Signed)
HPI: 46 yo male with a hx of kidney stones recently hospitalized with recurrent pancreatitis for which he is referred by Cristal Ford, DO. I reviewed hospital records from several admission dating back to first episode Aug 2017. He had similar pain many times but assumed it was kidney stone related. Reggie drank heavily for 7-8 years prior to first episode of pancreatitis. Marland Kitchen.He had not been taking any vitamins / herbs / usual suspect medications. . No OTC meds except for occasional Advil PM. LFT normal. No gallstones and normal caliber CBD on imaging. Upon discharge in Aug 2017 he reduced ETOH intake to two beers a week. Seven months later in March 2018 he was readmitted with recurrent pancreatitis, confirmed by CTscan . He stopped ETOH all together but readmitted in May 2018 with pain and CT scan revealing acute on probable chronic pancreatitis. Lipase was 3700.  Readmitted late July and again in earlier this month with acute pancreatitis, again documented by CT scan. No suspicious pancreatic lesons on imaging. He was prescribed Creon in hospital a few months ago but couldn't afford it outpatient.  He recently lost several pounds but slowly regaining. Fatty foods causing bloating and upper abdominal discomfort along with nausea. Hasn't required any pain meds since hospital discharge. Grandmother died of pancreatitis cancer. Patient gives a hx of kidney stones. He has   Stools loose, sometimes solid. No rectal bleeding.  .      Past Medical History:  Diagnosis Date  . Acute pancreatitis 11/23/2015  . Chronic bronchitis ([place])   . Chronic lower back pain   . Congenital single kidney   . GERD (gastroesophageal reflux disease)   . High cholesterol   . Hypertension   . Kidney calculi   . Migraines    "once/month" (11/23/2015)  . Nephrolithiasis   . Pneumonia ~ 09/2015  . Renal disorder      Past Surgical History:  Procedure Laterality Date  . NO PAST SURGERIES     Family History    Problem Relation Age of Onset  . Hypertension Other   . Hypertension Mother   . Hypertension Father   . Kidney disease Father   . Hypertension Sister   . Diabetes Sister   . Hypertension Brother    Social History  Substance Use Topics  . Smoking status: Never Smoker  . Smokeless tobacco: Never Used  . Alcohol use No     Comment: last drink months ago   Current Outpatient Prescriptions  Medication Sig Dispense Refill  . famotidine (PEPCID) 10 MG tablet Take 1 tablet (10 mg total) by mouth at bedtime as needed for heartburn or indigestion. 30 tablet 0  . feeding supplement (BOOST / RESOURCE BREEZE) LIQD Take 1 Container by mouth 3 (three) times daily between meals. 30 Container 0  . oxyCODONE-acetaminophen (ROXICET) 5-325 MG tablet Take 1 tablet by mouth every 6 (six) hours as needed. 20 tablet 0  . promethazine (PHENERGAN) 12.5 MG tablet Take 1 tablet (12.5 mg total) by mouth every 6 (six) hours as needed for nausea, vomiting or refractory nausea / vomiting. 30 tablet 0  . traMADol (ULTRAM) 50 MG tablet Take 50 mg by mouth every 6 (six) hours as needed for moderate pain.     No current facility-administered medications for this visit.    Allergies  Allergen Reactions  . Diclofenac Hives  . Norvasc [Amlodipine Besylate] Other (See Comments)    Unknown  . Omeprazole Other (See Comments)    Unknown  .  Prednisone Other (See Comments)    Mood swings     Review of Systems: All systems reviewed and negative except where noted in HPI.    Physical Exam: BP 136/90   Pulse 78   Ht 6\' 1"  (1.854 m)   Wt 178 lb (80.7 kg)   BMI 23.48 kg/m  Constitutional:  Well-developed, black male in no acute distress. Psychiatric: Normal mood and affect. Behavior is normal. EENT: Pupils normal.  Conjunctivae are normal. No scleral icterus. Neck supple.  Cardiovascular: Normal rate, regular rhythm. No edema Pulmonary/chest: Effort normal and breath sounds normal. No wheezing, rales or  rhonchi. Abdominal: Soft, nondistended. Nontender. Bowel sounds active throughout. There are no masses palpable. No hepatomegaly. Lymphadenopathy: No cervical adenopathy noted. Neurological: Alert and oriented to person place and time. Skin: Skin is warm and dry. No rashes noted.   ASSESSMENT AND PLAN:   46 yo male with multiple admissions for recurrent acute pancreatitis possibly superimposed on chronic pancreatitis and ETOH related. . Small calcifications in pancreatic head on CT scan. . PD dilation on CT scan but not on a follow up [place]. No cholelithiasis nor biliary duct dilation.  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  Occasional upper abdominal pain and nausea but significantly improved since recent admission.  -avoid etoh -creon 36K, two with meals and one with snacks. Our office will assist him in the enzymes through insurance.  -low fat diet -if recurrent pain patient knows to reduce diet to sips of clears and call our office.   Tye Savoy, NP  01/31/2017, 2:29 PM   Cc:  Cristal Ford, DO

## 2017-02-01 ENCOUNTER — Telehealth: Payer: Self-pay | Admitting: Nurse Practitioner

## 2017-02-01 NOTE — Telephone Encounter (Signed)
Office notes faxed today.

## 2017-02-06 NOTE — Progress Notes (Signed)
Reviewed and agree with documentation and assessment and plan. K. Veena Nandigam , MD   

## 2017-02-11 ENCOUNTER — Telehealth: Payer: Self-pay

## 2017-02-11 NOTE — Telephone Encounter (Signed)
-----   Message from Willia Craze, NP sent at 02/08/2017  4:38 PM EDT ----- Eustaquio Maize, please see if patient able to get the creon I prescribed. He couldn't afford it before. Thanks

## 2017-02-11 NOTE — Telephone Encounter (Signed)
I left a message on the voicemail. Asked the patient to return my call or leave me a message about the Creon.

## 2017-02-15 ENCOUNTER — Telehealth: Payer: Self-pay | Admitting: *Deleted

## 2017-02-15 NOTE — Telephone Encounter (Signed)
Received patient assistance from from [facility]. FIlled form out regarding medication Creon DR 36000/114000/[id number]. Tye Savoy NP signed the form and I faxed it on 02/14/2017 to [phone].

## 2017-02-20 NOTE — Telephone Encounter (Signed)
No return call from the patient. However a patient assistance form has been completed, signed and faxed for Creon.

## 2017-03-29 ENCOUNTER — Encounter: Payer: Self-pay | Admitting: Nurse Practitioner

## 2017-04-08 ENCOUNTER — Other Ambulatory Visit: Payer: Self-pay

## 2017-04-08 ENCOUNTER — Emergency Department (HOSPITAL_COMMUNITY)
Admission: EM | Admit: 2017-04-08 | Discharge: 2017-04-08 | Disposition: A | Discharge status: Home / Self Care | Payer: Managed Care, Other (non HMO) | Attending: Emergency Medicine | Admitting: Emergency Medicine

## 2017-04-08 ENCOUNTER — Encounter (HOSPITAL_COMMUNITY): Payer: Self-pay | Admitting: Emergency Medicine

## 2017-04-08 ENCOUNTER — Emergency Department (HOSPITAL_COMMUNITY): Payer: Managed Care, Other (non HMO)

## 2017-04-08 DIAGNOSIS — R05 Cough: Secondary | ICD-10-CM | POA: Diagnosis present

## 2017-04-08 DIAGNOSIS — Z79899 Other long term (current) drug therapy: Secondary | ICD-10-CM | POA: Insufficient documentation

## 2017-04-08 DIAGNOSIS — I10 Essential (primary) hypertension: Secondary | ICD-10-CM | POA: Diagnosis not present

## 2017-04-08 DIAGNOSIS — J209 Acute bronchitis, unspecified: Secondary | ICD-10-CM | POA: Diagnosis not present

## 2017-04-08 DIAGNOSIS — J40 Bronchitis, not specified as acute or chronic: Secondary | ICD-10-CM

## 2017-04-08 LAB — COMPREHENSIVE METABOLIC PANEL
ALBUMIN: 3.8 g/dL (ref 3.5–5.0)
ALT: 24 U/L (ref 17–63)
AST: 34 U/L (ref 15–41)
Alkaline Phosphatase: 73 U/L (ref 38–126)
Anion gap: 9 (ref 5–15)
BILIRUBIN TOTAL: 1 mg/dL (ref 0.3–1.2)
BUN: 13 mg/dL (ref 6–20)
CHLORIDE: 104 mmol/L (ref 101–111)
CO2: 22 mmol/L (ref 22–32)
CREATININE: 1.18 mg/dL (ref 0.61–1.24)
Calcium: 8.8 mg/dL — ABNORMAL LOW (ref 8.9–10.3)
GFR calc Af Amer: >60 mL/min (ref >60)
GFR calc non Af Amer: >60 mL/min (ref >60)
GLUCOSE: 150 mg/dL — AB (ref 65–99)
POTASSIUM: 3.3 mmol/L — AB (ref 3.5–5.1)
Sodium: 135 mmol/L (ref 135–145)
Total Protein: 7.3 g/dL (ref 6.5–8.1)

## 2017-04-08 LAB — CBC WITH DIFFERENTIAL/PLATELET
Basophils Absolute: 0 10*3/uL (ref 0.0–0.1)
Basophils Relative: 0 %
EOS ABS: 0.1 10*3/uL (ref 0.0–0.7)
Eosinophils Relative: 1 %
HCT: 40.3 % (ref 39.0–52.0)
Hemoglobin: 13.7 g/dL (ref 13.0–17.0)
LYMPHS ABS: 2.9 10*3/uL (ref 0.7–4.0)
LYMPHS PCT: 43 %
MCH: 32.6 pg (ref 26.0–34.0)
MCHC: 34 g/dL (ref 30.0–36.0)
MCV: 96 fL (ref 78.0–100.0)
MONO ABS: 0.3 10*3/uL (ref 0.1–1.0)
Monocytes Relative: 5 %
Neutro Abs: 3.4 10*3/uL (ref 1.7–7.7)
Neutrophils Relative %: 51 %
PLATELETS: 207 10*3/uL (ref 150–400)
RBC: 4.2 MIL/uL — ABNORMAL LOW (ref 4.22–5.81)
RDW: 13.6 % (ref 11.5–15.5)
WBC: 6.7 10*3/uL (ref 4.0–10.5)

## 2017-04-08 MED ORDER — DOXYCYCLINE HYCLATE 100 MG PO TABS
100.0000 mg | ORAL_TABLET | Freq: Once | ORAL | Status: AC
  Administered 2017-04-08: 100 mg via ORAL
  Filled 2017-04-08: qty 1

## 2017-04-08 MED ORDER — GUAIFENESIN 100 MG/5ML PO LIQD: 100.0000 mg | ORAL | 0 refills | Status: DC | PRN

## 2017-04-08 MED ORDER — DOXYCYCLINE HYCLATE 100 MG PO CAPS: 100.0000 mg | ORAL_CAPSULE | Freq: Two times a day (BID) | ORAL | 0 refills | Status: AC

## 2017-04-08 NOTE — ED Triage Notes (Signed)
Pt to ER for 2 week hx of productive cough, fever, and congestion. States has been trying over the counter products with minimal relief. NAD. VSS.

## 2017-04-08 NOTE — ED Provider Notes (Signed)
[facility] Provider Note   CSN: [id number] Arrival date & time: 04/08/17  1153     History   Chief Complaint Chief Complaint  Patient presents with  . Cough    HPI EDSEL SHIVES is a 46 y.o. male.  HPI 46 y.o. male, presents to the Emergency Department today due to cough x 2 weeks. Subjective fevers. Noted congestion and rhinorrhea. Notes N/V/D. Sick contacts at home. No CP/SOB/ABD pain. No headache. No neck stiffness. Denies pain currently. OTC medications with minimal relief. No other symptoms noted   Past Medical History:  Diagnosis Date  . Acute pancreatitis 11/23/2015  . Chronic bronchitis ([place])   . Chronic lower back pain   . Congenital single kidney   . GERD (gastroesophageal reflux disease)   . High cholesterol   . Hypertension   . Kidney calculi   . Migraines    "once/month" (11/23/2015)  . Nephrolithiasis   . Pneumonia ~ 09/2015  . Renal disorder     Patient Active Problem List   Diagnosis Date Noted  . Acute on chronic pancreatitis ([place]) 01/17/2017  . Recurrent acute pancreatitis   . History of alcohol abuse   . Hypokalemia   . Non-intractable vomiting with nausea   . Essential hypertension   . Esophageal reflux   . Pancreatitis 11/23/2015    Past Surgical History:  Procedure Laterality Date  . NO PAST SURGERIES         Home Medications    Prior to Admission medications   Medication Sig Start Date End Date Taking? Authorizing Provider  famotidine (PEPCID) 10 MG tablet Take 1 tablet (10 mg total) by mouth at bedtime as needed for heartburn or indigestion. 01/20/17  Yes Mikhail, [place], DO  feeding supplement (BOOST / RESOURCE BREEZE) LIQD Take 1 Container by mouth 3 (three) times daily between meals. 01/20/17  Yes Mikhail, Velta Addison, DO  ibuprofen (ADVIL,MOTRIN) 200 MG tablet Take 200 mg by mouth at bedtime as needed (sleep).   Yes [provider]  lipase/protease/amylase (CREON) 36000 UNITS  CPEP capsule Take 1 capsule (36,000 Units total) by mouth as directed. Take two with meals and one with snacks Patient taking differently: Take 36,000 Units by mouth See admin instructions. Take two capsules (72000 units) by mouth with meals (2 meals daily) and one capsule (36000 units) with snacks (3 or 4 daily) 01/31/17  Yes Willia Craze, NP  promethazine (PHENERGAN) 12.5 MG tablet Take 1 tablet (12.5 mg total) by mouth every 6 (six) hours as needed for nausea, vomiting or refractory nausea / vomiting. 01/20/17  Yes Mikhail, [place], DO  doxycycline (VIBRAMYCIN) 100 MG capsule Take 1 capsule (100 mg total) by mouth 2 (two) times daily for 7 days. 04/08/17 04/15/17  Shary Decamp, PA-C  guaiFENesin (ROBITUSSIN) 100 MG/5ML liquid Take 5-10 mLs (100-200 mg total) by mouth every 4 (four) hours as needed for cough. 04/08/17   Shary Decamp, PA-C  oxyCODONE-acetaminophen (ROXICET) 5-325 MG tablet Take 1 tablet by mouth every 6 (six) hours as needed. Patient not taking: Reported on 04/08/2017 01/20/17 01/20/18  Cristal Ford, DO    Family History Family History  Problem Relation Age of Onset  . Hypertension Other   . Hypertension Mother   . Hypertension Father   . Kidney disease Father   . Hypertension Sister   . Diabetes Sister   . Hypertension Brother     Social History Social History   Tobacco Use  . Smoking status: Never Smoker  .  Smokeless tobacco: Never Used  Substance Use Topics  . Alcohol use: No    Alcohol/week: 3.6 oz    Types: 6 Cans of beer per week    Comment: last drink months ago  . Drug use: No     Allergies   Diclofenac; Norvasc [amlodipine besylate]; Omeprazole; and Prednisone   Review of Systems Review of Systems ROS reviewed and all are negative for acute change except as noted in the HPI.  Physical Exam Updated Vital Signs BP (!) 146/106 (BP Location: Right Arm)   Pulse 74   Temp 99 F (37.2 C) (Oral)   Resp 16   SpO2 100%   Physical Exam    Constitutional: He is oriented to person, place, and time. He appears well-developed and well-nourished. No distress.  HENT:  Head: Normocephalic and atraumatic.  Right Ear: Tympanic membrane, external ear and ear canal normal.  Left Ear: Tympanic membrane, external ear and ear canal normal.  Nose: Nose normal.  Mouth/Throat: Uvula is midline, oropharynx is clear and moist and mucous membranes are normal. No trismus in the jaw. No oropharyngeal exudate, posterior oropharyngeal erythema or tonsillar abscesses.  Eyes: EOM are normal. Pupils are equal, round, and reactive to light.  Neck: Normal range of motion. Neck supple. No tracheal deviation present.  Cardiovascular: Normal rate, regular rhythm, S1 normal, S2 normal, normal heart sounds, intact distal pulses and normal pulses.  Pulmonary/Chest: Effort normal and breath sounds normal. No respiratory distress. He has no decreased breath sounds. He has no wheezes. He has no rhonchi. He has no rales.  Abdominal: Normal appearance and bowel sounds are normal. There is no tenderness.  Musculoskeletal: Normal range of motion.  Neurological: He is alert and oriented to person, place, and time.  Skin: Skin is warm and dry.  Psychiatric: He has a normal mood and affect. His speech is normal and behavior is normal. Thought content normal.  Nursing note and vitals reviewed.    ED Treatments / Results  Labs (all labs ordered are listed, but only abnormal results are displayed) Labs Reviewed  COMPREHENSIVE METABOLIC PANEL - Abnormal; Notable for the following components:      Result Value   Potassium 3.3 (*)    Glucose, Bld 150 (*)    Calcium 8.8 (*)    All other components within normal limits  CBC WITH DIFFERENTIAL/PLATELET - Abnormal; Notable for the following components:   RBC 4.20 (*)    All other components within normal limits    EKG  EKG Interpretation None       Radiology Dg Chest 2 View  Result Date: 04/08/2017 CLINICAL  DATA:  Productive cough for 2 weeks.  Fever and congestion EXAM: CHEST  2 VIEW COMPARISON:  06/25/2016 FINDINGS: No evidence of pneumonia. No edema, effusion, or pneumothorax. Subtle linear density overlapping the anterior left second rib is stable since 2017 studies and shape favor scar. Normal heart size and mediastinal contours. IMPRESSION: No evidence of pneumonia.  Stable compared to 2017. Electronically Signed   By: Monte Fantasia M.D.   On: 04/08/2017 12:49    Procedures Procedures (including critical care time)  Medications Ordered in ED Medications  doxycycline (VIBRA-TABS) tablet 100 mg (not administered)     Initial Impression / Assessment and Plan / ED Course  I have reviewed the triage vital signs and the nursing notes.  Pertinent labs & imaging results that were available during my care of the patient were reviewed by me and considered in my medical  decision making (see chart for details).  Final Clinical Impressions(s) / ED Diagnoses  {I have reviewed and evaluated the relevant laboratory values. {I have reviewed and evaluated the relevant imaging studies.  {I have reviewed the relevant previous healthcare records.  {I obtained HPI from historian.   ED Course:  Assessment: Pt is a 46 y.o. male presents to the Emergency Department today due to cough x 2 weeks. Subjective fevers. Noted congestion and rhinorrhea. Notes N/V/D. Sick contacts at home. No CP/SOB/ABD pain. No headache. No neck stiffness. Denies pain currently. OTC medications with minimal relief. On exam, pt in NAD. Nontoxic/nonseptic appearing. VSS. Afebrile. Lungs CTA. Heart RRR. Abdomen nontender soft. Labs unremarkable. CXR unremarkable. Given doxy in ED. Plan is to DC home with ABX due to duration of symptoms. At time of discharge, Patient is in no acute distress. Vital Signs are stable. Patient is able to ambulate. Patient able to tolerate PO.   Disposition/Plan:  DC Home Additional Verbal discharge  instructions given and discussed with patient.  Pt Instructed to f/u with PCP in the next week for evaluation and treatment of symptoms. Return precautions given Pt acknowledges and agrees with plan  Supervising Physician Drenda Freeze, MD  Final diagnoses:  Bronchitis    ED Discharge Orders        Ordered    doxycycline (VIBRAMYCIN) 100 MG capsule  2 times daily     04/08/17 2140    guaiFENesin (ROBITUSSIN) 100 MG/5ML liquid  Every 4 hours PRN     04/08/17 2140       Shary Decamp, PA-C 04/08/17 2143    Drenda Freeze, MD 04/11/17 [phone]

## 2017-04-08 NOTE — Discharge Instructions (Signed)
Please read and follow all provided instructions.  Your diagnoses today include:  1. Bronchitis     Tests performed today include: Vital signs. See below for your results today.   Medications prescribed:  Take as prescribed   Home care instructions:  Follow any educational materials contained in this packet.  Follow-up instructions: Please follow-up with your primary care provider for further evaluation of symptoms and treatment   Return instructions:  Please return to the Emergency Department if you do not get better, if you get worse, or new symptoms OR  - Fever (temperature greater than 101.25F)  - Bleeding that does not stop with holding pressure to the area    -Severe pain (please note that you may be more sore the day after your accident)  - Chest Pain  - Difficulty breathing  - Severe nausea or vomiting  - Inability to tolerate food and liquids  - Passing out  - Skin becoming red around your wounds  - Change in mental status (confusion or lethargy)  - New numbness or weakness    Please return if you have any other emergent concerns.  Additional Information:  Your vital signs today were: BP (!) 146/106 (BP Location: Right Arm)    Pulse 74    Temp 99 F (37.2 C) (Oral)    Resp 16    SpO2 100%  If your blood pressure (BP) was elevated above 135/85 this visit, please have this repeated by your doctor within one month. ---------------

## 2017-05-12 ENCOUNTER — Emergency Department (HOSPITAL_COMMUNITY)
Admission: EM | Admit: 2017-05-12 | Discharge: 2017-05-12 | Disposition: A | Discharge status: Home / Self Care | Payer: Managed Care, Other (non HMO) | Source: Home / Self Care | Attending: Emergency Medicine | Admitting: Emergency Medicine

## 2017-05-12 ENCOUNTER — Encounter (HOSPITAL_COMMUNITY): Payer: Self-pay

## 2017-05-12 ENCOUNTER — Other Ambulatory Visit: Payer: Self-pay

## 2017-05-12 DIAGNOSIS — K859 Acute pancreatitis without necrosis or infection, unspecified: Secondary | ICD-10-CM

## 2017-05-12 DIAGNOSIS — N200 Calculus of kidney: Secondary | ICD-10-CM | POA: Diagnosis present

## 2017-05-12 DIAGNOSIS — I10 Essential (primary) hypertension: Secondary | ICD-10-CM | POA: Insufficient documentation

## 2017-05-12 DIAGNOSIS — Z79899 Other long term (current) drug therapy: Secondary | ICD-10-CM | POA: Insufficient documentation

## 2017-05-12 DIAGNOSIS — R1013 Epigastric pain: Secondary | ICD-10-CM | POA: Diagnosis not present

## 2017-05-12 DIAGNOSIS — Q600 Renal agenesis, unilateral: Secondary | ICD-10-CM

## 2017-05-12 DIAGNOSIS — E781 Pure hyperglyceridemia: Secondary | ICD-10-CM | POA: Diagnosis present

## 2017-05-12 DIAGNOSIS — E876 Hypokalemia: Secondary | ICD-10-CM | POA: Diagnosis present

## 2017-05-12 DIAGNOSIS — Z8249 Family history of ischemic heart disease and other diseases of the circulatory system: Secondary | ICD-10-CM

## 2017-05-12 DIAGNOSIS — E7800 Pure hypercholesterolemia, unspecified: Secondary | ICD-10-CM | POA: Diagnosis present

## 2017-05-12 DIAGNOSIS — K219 Gastro-esophageal reflux disease without esophagitis: Secondary | ICD-10-CM | POA: Diagnosis present

## 2017-05-12 DIAGNOSIS — R8290 Unspecified abnormal findings in urine: Secondary | ICD-10-CM | POA: Diagnosis present

## 2017-05-12 DIAGNOSIS — K861 Other chronic pancreatitis: Secondary | ICD-10-CM | POA: Diagnosis present

## 2017-05-12 DIAGNOSIS — E785 Hyperlipidemia, unspecified: Secondary | ICD-10-CM | POA: Diagnosis present

## 2017-05-12 LAB — COMPREHENSIVE METABOLIC PANEL
ALBUMIN: 3.7 g/dL (ref 3.5–5.0)
ALK PHOS: 72 U/L (ref 38–126)
ALT: 50 U/L (ref 17–63)
AST: 60 U/L — AB (ref 15–41)
Anion gap: 14 (ref 5–15)
BILIRUBIN TOTAL: 1.1 mg/dL (ref 0.3–1.2)
BUN: 11 mg/dL (ref 6–20)
CALCIUM: 9.1 mg/dL (ref 8.9–10.3)
CO2: 22 mmol/L (ref 22–32)
Chloride: 99 mmol/L — ABNORMAL LOW (ref 101–111)
Creatinine, Ser: 1.13 mg/dL (ref 0.61–1.24)
GFR calc Af Amer: >60 mL/min (ref >60)
GFR calc non Af Amer: >60 mL/min (ref >60)
GLUCOSE: 117 mg/dL — AB (ref 65–99)
Potassium: 4.3 mmol/L (ref 3.5–5.1)
Sodium: 135 mmol/L (ref 135–145)
TOTAL PROTEIN: 7.2 g/dL (ref 6.5–8.1)

## 2017-05-12 LAB — URINALYSIS, ROUTINE W REFLEX MICROSCOPIC
BILIRUBIN URINE: NEGATIVE
Glucose, UA: NEGATIVE mg/dL
Hgb urine dipstick: NEGATIVE
KETONES UR: NEGATIVE mg/dL
Leukocytes, UA: NEGATIVE
NITRITE: NEGATIVE
PH: 6 (ref 5.0–8.0)
Protein, ur: NEGATIVE mg/dL
Specific Gravity, Urine: 1.02 (ref 1.005–1.030)

## 2017-05-12 LAB — CBC
HEMATOCRIT: 43 % (ref 39.0–52.0)
Hemoglobin: 14.6 g/dL (ref 13.0–17.0)
MCH: 33 pg (ref 26.0–34.0)
MCHC: 34 g/dL (ref 30.0–36.0)
MCV: 97.1 fL (ref 78.0–100.0)
Platelets: 208 10*3/uL (ref 150–400)
RBC: 4.43 MIL/uL (ref 4.22–5.81)
RDW: 13.7 % (ref 11.5–15.5)
WBC: 9.3 10*3/uL (ref 4.0–10.5)

## 2017-05-12 LAB — LIPASE, BLOOD: Lipase: 93 U/L — ABNORMAL HIGH (ref 11–51)

## 2017-05-12 MED ORDER — GI COCKTAIL ~~LOC~~
30.0000 mL | Freq: Once | ORAL | Status: AC
  Administered 2017-05-12: 30 mL via ORAL
  Filled 2017-05-12: qty 30

## 2017-05-12 MED ORDER — MORPHINE SULFATE (PF) 4 MG/ML IV SOLN
4.0000 mg | Freq: Once | INTRAVENOUS | Status: AC
  Administered 2017-05-12: 4 mg via INTRAVENOUS
  Filled 2017-05-12: qty 1

## 2017-05-12 MED ORDER — ONDANSETRON HCL 4 MG/2ML IJ SOLN
4.0000 mg | Freq: Once | INTRAMUSCULAR | Status: AC
  Administered 2017-05-12: 4 mg via INTRAVENOUS
  Filled 2017-05-12: qty 2

## 2017-05-12 MED ORDER — OXYCODONE-ACETAMINOPHEN 5-325 MG PO TABS
2.0000 | ORAL_TABLET | Freq: Once | ORAL | Status: AC
  Administered 2017-05-12: 2 via ORAL
  Filled 2017-05-12: qty 2

## 2017-05-12 MED ORDER — SODIUM CHLORIDE 0.9 % IV BOLUS (SEPSIS)
1000.0000 mL | Freq: Once | INTRAVENOUS | Status: AC
  Administered 2017-05-12: 1000 mL via INTRAVENOUS

## 2017-05-12 MED ORDER — MORPHINE SULFATE (PF) 4 MG/ML IV SOLN: 4.0000 mg | Freq: Once | INTRAVENOUS | Status: DC

## 2017-05-12 MED ORDER — ONDANSETRON HCL 4 MG/2ML IJ SOLN
4.0000 mg | Freq: Once | INTRAMUSCULAR | Status: AC | PRN
  Administered 2017-05-12: 4 mg via INTRAVENOUS
  Filled 2017-05-12: qty 2

## 2017-05-12 MED ORDER — HYDROMORPHONE HCL 1 MG/ML IJ SOLN
0.5000 mg | Freq: Once | INTRAMUSCULAR | Status: AC
  Administered 2017-05-12: 0.5 mg via INTRAVENOUS
  Filled 2017-05-12: qty 1

## 2017-05-12 MED ORDER — ONDANSETRON 4 MG PO TBDP: 4.0000 mg | ORAL_TABLET | Freq: Three times a day (TID) | ORAL | 0 refills | Status: DC | PRN

## 2017-05-12 NOTE — ED Provider Notes (Signed)
[facility] Provider Note   CSN: [id number] Arrival date & time: 05/12/17  0705     History   Chief Complaint Chief Complaint  Patient presents with  . Abdominal Pain    HPI [facility] is a 47 y.o. male.  The history is provided by the patient and medical records. No language interpreter was used.  Abdominal Pain   Associated symptoms include diarrhea, nausea and vomiting. Pertinent negatives include constipation.    [facility] is a 47 y.o. male  with a PMH of pancreatitis, HTN who presents to the Emergency Department complaining of upper abdominal pain. Patient states pain initially began as dull and intermittent a few weeks ago. This morning, around 4 am, pain acutely worsened and is now constant and throbbing. Associated with nausea and episode of vomiting this morning. Worse when lying on his back and after trying to eat. He took Tramadol at 4:30 am. He does report intermittent diarrhea over the last 2 weeks but last BM was yesterday and normal. Has GI doc whom he follows with. Reports no ETOH in a couple months.   Past Medical History:  Diagnosis Date  . Acute pancreatitis 11/23/2015  . Chronic bronchitis ([place])   . Chronic lower back pain   . Congenital single kidney   . GERD (gastroesophageal reflux disease)   . High cholesterol   . Hypertension   . Kidney calculi   . Migraines    "once/month" (11/23/2015)  . Nephrolithiasis   . Pneumonia ~ 09/2015  . Renal disorder     Patient Active Problem List   Diagnosis Date Noted  . Acute on chronic pancreatitis ([place]) 01/17/2017  . Recurrent acute pancreatitis   . History of alcohol abuse   . Hypokalemia   . Non-intractable vomiting with nausea   . Essential hypertension   . Esophageal reflux   . Pancreatitis 11/23/2015    Past Surgical History:  Procedure Laterality Date  . NO PAST SURGERIES         Home Medications    Prior to Admission medications     Medication Sig Start Date End Date Taking? Authorizing Provider  famotidine (PEPCID) 10 MG tablet Take 1 tablet (10 mg total) by mouth at bedtime as needed for heartburn or indigestion. 01/20/17  Yes Mikhail, [place], DO  feeding supplement (BOOST / RESOURCE BREEZE) LIQD Take 1 Container by mouth 3 (three) times daily between meals. 01/20/17  Yes Mikhail, Velta Addison, DO  guaiFENesin (ROBITUSSIN) 100 MG/5ML liquid Take 5-10 mLs (100-200 mg total) by mouth every 4 (four) hours as needed for cough. 04/08/17  Yes Shary Decamp, PA-C  ibuprofen (ADVIL,MOTRIN) 200 MG tablet Take 200 mg by mouth at bedtime as needed (sleep).   Yes [provider]  lipase/protease/amylase (CREON) 36000 UNITS CPEP capsule Take 1 capsule (36,000 Units total) by mouth as directed. Take two with meals and one with snacks Patient taking differently: Take 36,000 Units by mouth See admin instructions. Take one capsules (36,000 units) by mouth with meals (2 meals daily) and one capsule (36000 units) with snacks daily 01/31/17  Yes Willia Craze, NP  losartan (COZAAR) 25 MG tablet Take 25 mg by mouth daily.   Yes [provider]  oxyCODONE-acetaminophen (ROXICET) 5-325 MG tablet Take 1 tablet by mouth every 6 (six) hours as needed. 01/20/17 01/20/18 Yes Mikhail, Velta [place], DO  promethazine (PHENERGAN) 12.5 MG tablet Take 1 tablet (12.5 mg total) by mouth every 6 (six) hours as needed  for nausea, vomiting or refractory nausea / vomiting. 01/20/17  Yes Mikhail, [place], DO  ondansetron (ZOFRAN ODT) 4 MG disintegrating tablet Take 1 tablet (4 mg total) by mouth every 8 (eight) hours as needed for nausea or vomiting. 05/12/17   Reilyn Nelson, Ozella Almond, PA-C  Ibuprofen-Diphenhydramine HCl (ADVIL PM) 200-25 MG CAPS Take 1 tablet by mouth at bedtime as needed (sleep).  12/05/14  [provider]    Family History Family History  Problem Relation Age of Onset  . Hypertension Other   . Hypertension Mother   . Hypertension  Father   . Kidney disease Father   . Hypertension Sister   . Diabetes Sister   . Hypertension Brother     Social History Social History   Tobacco Use  . Smoking status: Never Smoker  . Smokeless tobacco: Never Used  Substance Use Topics  . Alcohol use: No    Alcohol/week: 3.6 oz    Types: 6 Cans of beer per week    Comment: last drink months ago  . Drug use: No     Allergies   Diclofenac; Norvasc [amlodipine besylate]; Omeprazole; and Prednisone   Review of Systems Review of Systems  Gastrointestinal: Positive for abdominal pain, diarrhea, nausea and vomiting. Negative for constipation.  All other systems reviewed and are negative.    Physical Exam Updated Vital Signs BP (!) 144/96   Pulse 87   Temp 98.4 F (36.9 C) (Oral)   Resp (!) 27   SpO2 96%   Physical Exam  Constitutional: He is oriented to person, place, and time. He appears well-developed and well-nourished. No distress.  Non-toxic appearing. Resting comfortably in bed.   HENT:  Head: Normocephalic and atraumatic.  Neck: Neck supple.  Cardiovascular: Normal rate, regular rhythm and normal heart sounds.  No murmur heard. Pulmonary/Chest: Effort normal and breath sounds normal. No respiratory distress.  Abdominal: Soft. Bowel sounds are normal. He exhibits no distension.  Diffuse upper abdominal tenderness. No rebound or guarding. Negative Murphy's.   Neurological: He is alert and oriented to person, place, and time.  Skin: Skin is warm and dry.  Nursing note and vitals reviewed.    ED Treatments / Results  Labs (all labs ordered are listed, but only abnormal results are displayed) Labs Reviewed  LIPASE, BLOOD - Abnormal; Notable for the following components:      Result Value   Lipase 93 (*)    All other components within normal limits  COMPREHENSIVE METABOLIC PANEL - Abnormal; Notable for the following components:   Chloride 99 (*)    Glucose, Bld 117 (*)    AST 60 (*)    All other  components within normal limits  CBC  URINALYSIS, ROUTINE W REFLEX MICROSCOPIC    EKG  EKG Interpretation None       Radiology No results found.  Procedures Procedures (including critical care time)  Medications Ordered in ED Medications  ondansetron (ZOFRAN) injection 4 mg (4 mg Intravenous Given 05/12/17 0853)  morphine 4 MG/ML injection 4 mg (4 mg Intravenous Given 05/12/17 0853)  sodium chloride 0.9 % bolus 1,000 mL (0 mLs Intravenous Stopped 05/12/17 1049)  oxyCODONE-acetaminophen (PERCOCET/ROXICET) 5-325 MG per tablet 2 tablet (2 tablets Oral Given 05/12/17 1054)  gi cocktail (Maalox,Lidocaine,Donnatal) (30 mLs Oral Given 05/12/17 1111)     Initial Impression / Assessment and Plan / ED Course  I have reviewed the triage vital signs and the nursing notes.  Pertinent labs & imaging results that were available during my  care of the patient were reviewed by me and considered in my medical decision making (see chart for details).  Clinical Course as of May 12 1132  Sun Jan 27, 344  2253 47 year old male with history of pancreatitis secondary to alcohol currently not drinking complaining of 2 weeks of upper abdominal pain that got acutely worse today.  She is complaining of nausea associated with no fever.  He is nontoxic-appearing is got a tender abdomen with no masses guarding or rebound.  She is getting lab work and fluids and some pain control.  [MB]    Clinical Course User Index [MB] Hayden Rasmussen, MD   [facility] is a 47 y.o. male who presents to ED for abdominal pain c/w typical flares of pancreatitis.  Afebrile, hemodynamically stable with no peritoneal signs.  He does have tenderness across the upper abdomen.  Negative Murphy sign.  Lipase elevated at 93.  IV fluids given.  Pain and nausea controlled in ED.  Patient tolerating p.o. without difficulty. Evaluation does not show pathology that would require ongoing emergent intervention or inpatient treatment.  Followed by GI - follow up encouraged. Reasons to return to ER discussed and all questions answered.    Final Clinical Impressions(s) / ED Diagnoses   Final diagnoses:  Recurrent acute pancreatitis    ED Discharge Orders        Ordered    ondansetron (ZOFRAN ODT) 4 MG disintegrating tablet  Every 8 hours PRN     05/12/17 1107       [facility], Ozella Almond, PA-C 05/12/17 1527    Hayden Rasmussen, MD 05/14/17 [phone]

## 2017-05-12 NOTE — Discharge Instructions (Signed)
It was my pleasure taking care of you today!   Zofran as needed for nausea. Continue taking your home pain medications as needed.   Follow up with your GI doctor. Call in the morning to schedule a follow up appointment.   Return to ER for fevers, blood in the stool, inability to keep fluids down despite nausea medications, new or worsening symptoms, any additional concerns.

## 2017-05-12 NOTE — ED Triage Notes (Signed)
Pt states he began having abd pain around 0400. Hx of pancreatitis. States this feels the same. Pt also reports n/v.

## 2017-05-12 NOTE — ED Notes (Signed)
Pt reports minor relief from pain. Denies vomiting. Roselyn Reef, [place] notified

## 2017-05-13 ENCOUNTER — Inpatient Hospital Stay (HOSPITAL_COMMUNITY)
Admission: EM | Admit: 2017-05-13 | Discharge: 2017-05-16 | DRG: 439 | Disposition: A | Discharge status: Home / Self Care | Payer: Managed Care, Other (non HMO) | Attending: Internal Medicine | Admitting: Internal Medicine

## 2017-05-13 ENCOUNTER — Encounter (HOSPITAL_COMMUNITY): Payer: Self-pay | Admitting: Emergency Medicine

## 2017-05-13 ENCOUNTER — Other Ambulatory Visit: Payer: Self-pay

## 2017-05-13 DIAGNOSIS — Q600 Renal agenesis, unilateral: Secondary | ICD-10-CM

## 2017-05-13 DIAGNOSIS — K219 Gastro-esophageal reflux disease without esophagitis: Secondary | ICD-10-CM | POA: Diagnosis present

## 2017-05-13 DIAGNOSIS — N200 Calculus of kidney: Secondary | ICD-10-CM

## 2017-05-13 DIAGNOSIS — R8290 Unspecified abnormal findings in urine: Secondary | ICD-10-CM | POA: Diagnosis present

## 2017-05-13 DIAGNOSIS — K858 Other acute pancreatitis without necrosis or infection: Secondary | ICD-10-CM

## 2017-05-13 DIAGNOSIS — I10 Essential (primary) hypertension: Secondary | ICD-10-CM | POA: Diagnosis present

## 2017-05-13 DIAGNOSIS — IMO0002 Reserved for concepts with insufficient information to code with codable children: Secondary | ICD-10-CM | POA: Diagnosis present

## 2017-05-13 DIAGNOSIS — K859 Acute pancreatitis without necrosis or infection, unspecified: Secondary | ICD-10-CM | POA: Diagnosis present

## 2017-05-13 DIAGNOSIS — E7800 Pure hypercholesterolemia, unspecified: Secondary | ICD-10-CM | POA: Diagnosis present

## 2017-05-13 HISTORY — DX: Acute pancreatitis without necrosis or infection, unspecified: K85.90

## 2017-05-13 HISTORY — DX: Calculus of kidney: N20.0

## 2017-05-13 HISTORY — DX: Headache, unspecified: R51.9

## 2017-05-13 HISTORY — DX: Personal history of urinary calculi: Z87.442

## 2017-05-13 HISTORY — DX: Migraine, unspecified, not intractable, without status migrainosus: G43.909

## 2017-05-13 HISTORY — DX: Headache: R51

## 2017-05-13 LAB — RAPID URINE DRUG SCREEN, HOSP PERFORMED
AMPHETAMINES: NOT DETECTED
Barbiturates: NOT DETECTED
Benzodiazepines: NOT DETECTED
Cocaine: NOT DETECTED
OPIATES: POSITIVE — AB
TETRAHYDROCANNABINOL: NOT DETECTED

## 2017-05-13 LAB — COMPREHENSIVE METABOLIC PANEL
ALK PHOS: 69 U/L (ref 38–126)
ALT: 40 U/L (ref 17–63)
ANION GAP: 12 (ref 5–15)
AST: 42 U/L — ABNORMAL HIGH (ref 15–41)
Albumin: 3.6 g/dL (ref 3.5–5.0)
BUN: 8 mg/dL (ref 6–20)
CO2: 22 mmol/L (ref 22–32)
CREATININE: 1.2 mg/dL (ref 0.61–1.24)
Calcium: 8.8 mg/dL — ABNORMAL LOW (ref 8.9–10.3)
Chloride: 104 mmol/L (ref 101–111)
Glucose, Bld: 120 mg/dL — ABNORMAL HIGH (ref 65–99)
Potassium: 3.5 mmol/L (ref 3.5–5.1)
SODIUM: 138 mmol/L (ref 135–145)
Total Bilirubin: 0.9 mg/dL (ref 0.3–1.2)
Total Protein: 6.7 g/dL (ref 6.5–8.1)

## 2017-05-13 LAB — LIPASE, BLOOD: Lipase: 336 U/L — ABNORMAL HIGH (ref 11–51)

## 2017-05-13 LAB — CBC
HCT: 40.9 % (ref 39.0–52.0)
HEMOGLOBIN: 13.9 g/dL (ref 13.0–17.0)
MCH: 32.7 pg (ref 26.0–34.0)
MCHC: 34 g/dL (ref 30.0–36.0)
MCV: 96.2 fL (ref 78.0–100.0)
Platelets: 199 10*3/uL (ref 150–400)
RBC: 4.25 MIL/uL (ref 4.22–5.81)
RDW: 13.4 % (ref 11.5–15.5)
WBC: 11.2 10*3/uL — AB (ref 4.0–10.5)

## 2017-05-13 LAB — URINALYSIS, ROUTINE W REFLEX MICROSCOPIC
BILIRUBIN URINE: NEGATIVE
Bacteria, UA: NONE SEEN
GLUCOSE, UA: NEGATIVE mg/dL
KETONES UR: NEGATIVE mg/dL
NITRITE: NEGATIVE
Protein, ur: 30 mg/dL — AB
Specific Gravity, Urine: 1.034 — ABNORMAL HIGH (ref 1.005–1.030)
pH: 5 (ref 5.0–8.0)

## 2017-05-13 LAB — GAMMA GT: GGT: 328 U/L — AB (ref 7–50)

## 2017-05-13 LAB — LACTATE DEHYDROGENASE: LDH: 94 U/L — AB (ref 98–192)

## 2017-05-13 LAB — ETHANOL: Alcohol, Ethyl (B): <10 mg/dL (ref <10)

## 2017-05-13 MED ORDER — ACETAMINOPHEN 650 MG RE SUPP: 650.0000 mg | Freq: Four times a day (QID) | RECTAL | Status: DC | PRN

## 2017-05-13 MED ORDER — ONDANSETRON HCL 4 MG/2ML IJ SOLN
4.0000 mg | Freq: Once | INTRAMUSCULAR | Status: DC
  Administered 2017-05-13: 4 mg via INTRAVENOUS
  Filled 2017-05-13: qty 2

## 2017-05-13 MED ORDER — MORPHINE SULFATE (PF) 4 MG/ML IV SOLN
1.0000 mg | INTRAVENOUS | Status: DC | PRN
  Administered 2017-05-13 – 2017-05-15 (×16): 4 mg via INTRAVENOUS
  Filled 2017-05-13 (×15): qty 1

## 2017-05-13 MED ORDER — PROMETHAZINE HCL 25 MG/ML IJ SOLN
25.0000 mg | Freq: Four times a day (QID) | INTRAMUSCULAR | Status: DC | PRN
  Administered 2017-05-13 – 2017-05-15 (×3): 25 mg via INTRAVENOUS
  Filled 2017-05-13 (×3): qty 1

## 2017-05-13 MED ORDER — HYDRALAZINE HCL 20 MG/ML IJ SOLN: 10.0000 mg | Freq: Four times a day (QID) | INTRAMUSCULAR | Status: DC | PRN

## 2017-05-13 MED ORDER — HYDROMORPHONE HCL 1 MG/ML IJ SOLN
1.0000 mg | Freq: Once | INTRAMUSCULAR | Status: DC
  Administered 2017-05-13: 1 mg via INTRAVENOUS
  Filled 2017-05-13: qty 1

## 2017-05-13 MED ORDER — SODIUM CHLORIDE 0.9 % IV BOLUS (SEPSIS)
1000.0000 mL | Freq: Once | INTRAVENOUS | Status: AC
  Administered 2017-05-13: 1000 mL via INTRAVENOUS

## 2017-05-13 MED ORDER — ENOXAPARIN SODIUM 40 MG/0.4ML ~~LOC~~ SOLN: 40.0000 mg | SUBCUTANEOUS | Status: DC

## 2017-05-13 MED ORDER — SODIUM CHLORIDE 0.9% FLUSH: 3.0000 mL | Freq: Two times a day (BID) | INTRAVENOUS | Status: DC

## 2017-05-13 MED ORDER — FAMOTIDINE IN NACL 20-0.9 MG/50ML-% IV SOLN
20.0000 mg | Freq: Two times a day (BID) | INTRAVENOUS | Status: DC
  Administered 2017-05-13 – 2017-05-15 (×6): 20 mg via INTRAVENOUS
  Filled 2017-05-13 (×7): qty 50

## 2017-05-13 MED ORDER — ONDANSETRON 4 MG PO TBDP
4.0000 mg | ORAL_TABLET | Freq: Once | ORAL | Status: AC | PRN
  Administered 2017-05-13: 4 mg via ORAL
  Filled 2017-05-13: qty 1

## 2017-05-13 MED ORDER — ONDANSETRON HCL 4 MG/2ML IJ SOLN
4.0000 mg | Freq: Four times a day (QID) | INTRAMUSCULAR | Status: DC | PRN
  Administered 2017-05-15: 4 mg via INTRAVENOUS
  Filled 2017-05-13 (×2): qty 2

## 2017-05-13 MED ORDER — HYDROMORPHONE HCL 1 MG/ML IJ SOLN
1.0000 mg | INTRAMUSCULAR | Status: DC | PRN
  Administered 2017-05-13: 1 mg via INTRAVENOUS
  Filled 2017-05-13: qty 1

## 2017-05-13 MED ORDER — SODIUM CHLORIDE 0.9 % IV SOLN
INTRAVENOUS | Status: DC
  Administered 2017-05-13 – 2017-05-16 (×11): via INTRAVENOUS

## 2017-05-13 MED ORDER — OXYCODONE HCL 5 MG PO TABS
5.0000 mg | ORAL_TABLET | ORAL | Status: DC | PRN
  Administered 2017-05-13 – 2017-05-15 (×3): 5 mg via ORAL
  Filled 2017-05-13 (×4): qty 1

## 2017-05-13 MED ORDER — ACETAMINOPHEN 325 MG PO TABS: 650.0000 mg | ORAL_TABLET | Freq: Four times a day (QID) | ORAL | Status: DC | PRN

## 2017-05-13 MED ORDER — ONDANSETRON 4 MG PO TBDP: 4.0000 mg | ORAL_TABLET | Freq: Three times a day (TID) | ORAL | Status: DC | PRN

## 2017-05-13 NOTE — ED Triage Notes (Signed)
Reports being here earlier today for pancreatitis.  Not feeling any better.  Has been vomiting all day.

## 2017-05-13 NOTE — ED Provider Notes (Signed)
TIME SEEN: 5:39 AM  CHIEF COMPLAINT: Abdominal pain, vomiting  HPI: Patient is a 47 year old male with history of hypertension, hyperlipidemia, kidney stones, pancreatitis who presents to the emergency department with diffuse abdominal pain similar to his pancreatitis.  Described as severe in nature, sharp.  Now radiates throughout the abdomen.  Initially started off in the epigastric region.  Has had nausea and vomiting.  Unable to keep anything down at home.  Was seen here yesterday and discharged home but now feels like his symptoms have gotten much worse and he feels he will need admission.  No chest pain or shortness of breath.  No diarrhea.  No bloody stool or melena.  States last alcoholic beverage was over 3 months ago.  ROS: See HPI Constitutional: no fever  Eyes: no drainage  ENT: no runny nose   Cardiovascular:  no chest pain  Resp: no SOB  GI: no vomiting GU: no dysuria Integumentary: no rash  Allergy: no hives  Musculoskeletal: no leg swelling  Neurological: no slurred speech ROS otherwise negative  PAST MEDICAL HISTORY/PAST SURGICAL HISTORY:  Past Medical History:  Diagnosis Date  . Acute pancreatitis 11/23/2015  . Chronic bronchitis ([place])   . Chronic lower back pain   . Congenital single kidney   . GERD (gastroesophageal reflux disease)   . High cholesterol   . Hypertension   . Kidney calculi   . Migraines    "once/month" (11/23/2015)  . Nephrolithiasis   . Pneumonia ~ 09/2015  . Renal disorder     MEDICATIONS:  Prior to Admission medications   Medication Sig Start Date End Date Taking? Authorizing Provider  famotidine (PEPCID) 10 MG tablet Take 1 tablet (10 mg total) by mouth at bedtime as needed for heartburn or indigestion. 01/20/17   Mikhail, Velta Addison, DO  feeding supplement (BOOST / RESOURCE BREEZE) LIQD Take 1 Container by mouth 3 (three) times daily between meals. 01/20/17   Mikhail, Velta Addison, DO  guaiFENesin (ROBITUSSIN) 100 MG/5ML liquid Take 5-10 mLs  (100-200 mg total) by mouth every 4 (four) hours as needed for cough. 04/08/17   Shary Decamp, PA-C  ibuprofen (ADVIL,MOTRIN) 200 MG tablet Take 200 mg by mouth at bedtime as needed (sleep).    [provider]  lipase/protease/amylase (CREON) 36000 UNITS CPEP capsule Take 1 capsule (36,000 Units total) by mouth as directed. Take two with meals and one with snacks Patient taking differently: Take 36,000 Units by mouth See admin instructions. Take one capsules (36,000 units) by mouth with meals (2 meals daily) and one capsule (36000 units) with snacks daily 01/31/17   Willia Craze, NP  losartan (COZAAR) 25 MG tablet Take 25 mg by mouth daily.    [provider]  ondansetron (ZOFRAN ODT) 4 MG disintegrating tablet Take 1 tablet (4 mg total) by mouth every 8 (eight) hours as needed for nausea or vomiting. 05/12/17   Jadzia Ibsen, Ozella Almond, PA-C  oxyCODONE-acetaminophen (ROXICET) 5-325 MG tablet Take 1 tablet by mouth every 6 (six) hours as needed. 01/20/17 01/20/18  Cristal Ford, DO  promethazine (PHENERGAN) 12.5 MG tablet Take 1 tablet (12.5 mg total) by mouth every 6 (six) hours as needed for nausea, vomiting or refractory nausea / vomiting. 01/20/17   Cristal Ford, DO    ALLERGIES:  Allergies  Allergen Reactions  . Diclofenac Hives  . Norvasc [Amlodipine Besylate] Other (See Comments)    Fluid buildup in chest  . Omeprazole Other (See Comments)    MD stopped due to pancreatitis  . Prednisone  Other (See Comments)    Mood swings    SOCIAL HISTORY:  Social History   Tobacco Use  . Smoking status: Never Smoker  . Smokeless tobacco: Never Used  Substance Use Topics  . Alcohol use: No    Alcohol/week: 3.6 oz    Types: 6 Cans of beer per week    Comment: last drink months ago    FAMILY HISTORY: Family History  Problem Relation Age of Onset  . Hypertension Other   . Hypertension Mother   . Hypertension Father   . Kidney disease Father   . Hypertension Sister    . Diabetes Sister   . Hypertension Brother     EXAM: BP (!) 131/99 (BP Location: Right Arm)   Pulse 84   Temp 98.4 F (36.9 C) (Oral)   Resp 18   Ht 6\' 1"  (1.854 m)   Wt 83.9 kg (185 lb)   SpO2 100%   BMI 24.41 kg/m  CONSTITUTIONAL: Alert and oriented and responds appropriately to questions. Well-appearing; well-nourished HEAD: Normocephalic EYES: Conjunctivae clear, pupils appear equal, EOMI ENT: normal nose; moist mucous membranes NECK: Supple, no meningismus, no nuchal rigidity, no LAD  CARD: RRR; S1 and S2 appreciated; no murmurs, no clicks, no rubs, no gallops RESP: Normal chest excursion without splinting or tachypnea; breath sounds clear and equal bilaterally; no wheezes, no rhonchi, no rales, no hypoxia or respiratory distress, speaking full sentences ABD/GI: Normal bowel sounds; non-distended; soft, tender to palpation diffusely worse in the epigastric region, no rebound, no guarding, no peritoneal signs, no hepatosplenomegaly BACK:  The back appears normal and is non-tender to palpation, there is no CVA tenderness EXT: Normal ROM in all joints; non-tender to palpation; no edema; normal capillary refill; no cyanosis, no calf tenderness or swelling    SKIN: Normal color for age and race; warm; no rash NEURO: Moves all extremities equally PSYCH: The patient's mood and manner are appropriate. Grooming and personal hygiene are appropriate.  MEDICAL DECISION MAKING: Patient here with acute pancreatitis.  Has had this several times in the past.  Frequently gets admitted for the same.  Feels he needs admission now.  Will give IV fluids, Dilaudid and Zofran.  His lipase has gone up from 93-336.  LFTs normal.  No fever.  Mild leukocytosis.  Denies recent alcohol use.  Appears clinically sober here.  PCP is tried adult and pediatric medicine.  ED PROGRESS:     5:57 AM Discussed patient's case with hospitalist, Dr. Blaine Hamper.  I have recommended admission and patient (and family if  present) agree with this plan. Admitting physician will place admission orders.   I reviewed all nursing notes, vitals, pertinent previous records, EKGs, lab and urine results, imaging (as available).     Claire Dolores, Delice Bison, DO 05/13/17 0600

## 2017-05-13 NOTE — ED Notes (Signed)
Pt complains of a pancreatitis flare up. Pt states he was just here on Friday for same, they sent him home with medication, and now same has not worked.

## 2017-05-13 NOTE — ED Notes (Signed)
Pt unable to void at this time. 

## 2017-05-13 NOTE — ED Notes (Signed)
Pt remains in waiting room. Updated on wait for treatment room. 

## 2017-05-13 NOTE — H&P (Signed)
History and Physical    NACARI POINT ZOX:[id number] DOB: July 07, 1970 DOA: 05/13/2017  **Will place patient in observation status based on the expectation that the patient will need hospitalization/ hospital care for <24 hours  PCP: Medicine, [facility] /UNASSIGNED  Attending physician: Ophelia Charter  Patient coming from/Resides with: Private residence  Chief Complaint: Abdominal pain  HPI: Kyle Berry is a 47 y.o. male with medical history significant for history of reported remote alcohol use and recurrent pancreatitis, hypertension, solitary congenital kidney, reflux, and dyslipidemia.  Patient was last hospitalized in October 2018 for mild acute pancreatitis. At that time he denied alcohol use for several months  Initial admission/diagnosis of pancreatitis August 2017 at which point he reduced to 2 beers per week.  Since that time he has had episodes of pancreatitis in March 2018, May 2018, July 2018 and in October as above.  Of note he does have a history of his grandmother dying of pancreatic cancer.  Since the most recent admission in October he has followed up with outpatient GI regarding his chronic pancreatitis.  It was documented that his abdominal MRI showed a possible pancreatic divisum.  He was instructed to avoid EtOH and utilize Creon 36k 2 tabs with meals and one with snacks; he was also instructed to begin a low-fat diet.  He returns to the ER overnight complaining of significant abdominal pain.  He initially presented to the ER early on the a.m. of 1/27 complaining of abdominal pain.  His lipase was mildly elevated at 93.  He was given IV fluids and pain and nausea were controlled in the ER and he was discharged home.  He returned to the ER early this morning on 1/28 around 530 with continued abdominal pain with nausea and vomiting.  Labs revealed lipase now up to 336.  AST still slightly elevated at 42 noting previously was 60.  He also had mild leukocytosis  of 11,200 but differential was not obtained.  Urinalysis was consistent with dehydration with hazy appearance, amber color, moderate hemoglobin, trace leukocytes, 30 of protein, and a specific gravity 1.034.  He was also found to have WBC 6-30.  EDP has requested admission for acute on chronic pancreatitis.  ED Course:  Vital Signs: BP (!) 157/94 (BP Location: Left Arm)   Pulse 78   Temp 98.1 F (36.7 C) (Oral)   Resp 18   Ht 6\' 1"  (1.854 m)   Wt 83.9 kg (185 lb)   SpO2 97%   BMI 24.41 kg/m  Lab data: Sodium 138, potassium 3.5, chloride 104, CO2 22, glucose 120, BUN 8, creatinine 1.2, calcium 8.8, anion gap 12, lipase 336, AST 42, white count 11,200 differential not obtained, urinalysis abnormal with hazy appearance, amber color, moderate hemoglobin, trace leukocytes, nitrite negative, protein 30, specific gravity 1.034, WBC 6-30, RBCs 6-30 Medications and treatments: Zofran 4 mg IV x2 doses, normal saline bolus times 2 L, Dilaudid 1 mg IV x1  Review of Systems:  In addition to the HPI above,  No Fever-chills, myalgias or other constitutional symptoms No Headache, changes with Vision or hearing, new weakness, tingling, numbness in any extremity, dizziness, dysarthria or word finding difficulty, gait disturbance or imbalance, tremors or seizure activity No problems swallowing food or Liquids, indigestion/reflux, choking or coughing while eating, abdominal pain with or after eating No Chest pain, Cough or Shortness of Breath, palpitations, orthopnea or DOE No melena,hematochezia, dark tarry stools, constipation No dysuria, malodorous urine, hematuria ;? flank pain patient reporting  left upper quadrant abdominal pain radiating to left flank/back No new skin rashes, lesions, masses or bruises, No new joint pains, aches, swelling or redness No recent unintentional weight gain or loss No polyuria, polydypsia or polyphagia   Past Medical History:  Diagnosis Date  . Acute pancreatitis  11/23/2015  . Chronic bronchitis ([facility])   . Chronic lower back pain   . Congenital single kidney   . GERD (gastroesophageal reflux disease)   . High cholesterol   . Hypertension   . Kidney calculi   . Migraines    "once/month" (11/23/2015)  . Nephrolithiasis   . Pneumonia ~ 09/2015  . Renal disorder     Past Surgical History:  Procedure Laterality Date  . NO PAST SURGERIES      Social History   Socioeconomic History  . Marital status: Single    Spouse name: Not on file  . Number of children: Not on file  . Years of education: Not on file  . Highest education level: Not on file  Social Needs  . Financial resource strain: Not on file  . Food insecurity - worry: Not on file  . Food insecurity - inability: Not on file  . Transportation needs - medical: Not on file  . Transportation needs - non-medical: Not on file  Occupational History  . Not on file  Tobacco Use  . Smoking status: Never Smoker  . Smokeless tobacco: Never Used  Substance and Sexual Activity  . Alcohol use: No    Alcohol/week: 3.6 oz    Types: 6 Cans of beer per week    Comment: last drink months ago  . Drug use: No  . Sexual activity: Yes    Birth control/protection: Condom  Other Topics Concern  . Not on file  Social History Narrative  . Not on file    Mobility: Independent Work history: Not obtained   Allergies  Allergen Reactions  . Diclofenac Hives  . Norvasc [Amlodipine Besylate] Other (See Comments)    Fluid buildup in chest  . Omeprazole Other (See Comments)    MD stopped due to pancreatitis  . Prednisone Other (See Comments)    Mood swings    Family History  Problem Relation Age of Onset  . Hypertension Other   . Hypertension Mother   . Hypertension Father   . Kidney disease Father   . Hypertension Sister   . Diabetes Sister   . Hypertension Brother      Prior to Admission medications   Medication Sig Start Date End Date Taking? Authorizing Provider  famotidine  (PEPCID) 10 MG tablet Take 1 tablet (10 mg total) by mouth at bedtime as needed for heartburn or indigestion. 01/20/17  Yes Mikhail, [place], DO  feeding supplement (BOOST / RESOURCE BREEZE) LIQD Take 1 Container by mouth 3 (three) times daily between meals. 01/20/17  Yes Mikhail, Nita Sells, DO  guaiFENesin (ROBITUSSIN) 100 MG/5ML liquid Take 5-10 mLs (100-200 mg total) by mouth every 4 (four) hours as needed for cough. 04/08/17  Yes Audry Pili, PA-C  ibuprofen (ADVIL,MOTRIN) 200 MG tablet Take 200 mg by mouth at bedtime as needed (sleep).   Yes [provider]  lipase/protease/amylase (CREON) 36000 UNITS CPEP capsule Take 1 capsule (36,000 Units total) by mouth as directed. Take two with meals and one with snacks Patient taking differently: Take 36,000 Units by mouth See admin instructions. Take one capsules (36,000 units) by mouth with meals (2 meals daily) and one capsule (36000 units) with snacks daily 01/31/17  Yes Meredith Pel, NP  losartan (COZAAR) 25 MG tablet Take 25 mg by mouth daily.   Yes [provider]  ondansetron (ZOFRAN ODT) 4 MG disintegrating tablet Take 1 tablet (4 mg total) by mouth every 8 (eight) hours as needed for nausea or vomiting. 05/12/17  Yes Ward, Chase [place], PA-C  promethazine (PHENERGAN) 12.5 MG tablet Take 1 tablet (12.5 mg total) by mouth every 6 (six) hours as needed for nausea, vomiting or refractory nausea / vomiting. 01/20/17  Yes Mikhail, [place], DO  oxyCODONE-acetaminophen (ROXICET) 5-325 MG tablet Take 1 tablet by mouth every 6 (six) hours as needed. Patient not taking: Reported on 05/13/2017 01/20/17 01/20/18  Edsel Petrin, DO    Physical Exam: Vitals:   05/13/17 0645 05/13/17 0700 05/13/17 0715 05/13/17 0808  BP: 139/84 (!) 151/98 (!) 147/88 (!) 157/94  Pulse: 84 (!) 105 84 78  Resp:   18 18  Temp:    98.1 F (36.7 C)  TempSrc:    Oral  SpO2: 97% 95% 98% 97%  Weight:      Height:    6\' 1"  (1.854 m)      Constitutional:  NAD, calm, comfortable Eyes: PERRL, lids and conjunctivae normal ENMT: Mucous membranes are moist. Posterior pharynx clear of any exudate or lesions.Normal dentition.  Neck: normal, supple, no masses, no thyromegaly Respiratory: clear to auscultation bilaterally, no wheezing, no crackles. Normal respiratory effort. No accessory muscle use.  Cardiovascular: Regular rate and rhythm, no murmurs / rubs / gallops. No extremity edema. 2+ pedal pulses. No carotid bruits.  Abdomen: Focal left upper quadrant tenderness with minimal palpation without guarding or rebounding, no masses palpated. No hepatosplenomegaly. Bowel sounds positive.  Abdomen soft and nondistended Musculoskeletal: no clubbing / cyanosis. No joint deformity upper and lower extremities. Good ROM, no contractures. Normal muscle tone.  Skin: no rashes, lesions, ulcers. No induration Neurologic: CN 2-12 grossly intact. Sensation intact, DTR normal. Strength 5/5 x all 4 extremities.  Psychiatric: Normal judgment and insight. Alert and oriented x 3. Normal mood.    Labs on Admission: I have personally reviewed following labs and imaging studies  CBC: Recent Labs  Lab 05/12/17 0711 05/13/17 0014  WBC 9.3 11.2*  HGB 14.6 13.9  HCT 43.0 40.9  MCV 97.1 96.2  PLT 208 199   Basic Metabolic Panel: Recent Labs  Lab 05/12/17 0711 05/13/17 0014  NA 135 138  K 4.3 3.5  CL 99* 104  CO2 22 22  GLUCOSE 117* 120*  BUN 11 8  CREATININE 1.13 1.20  CALCIUM 9.1 8.8*   GFR: Estimated Creatinine Clearance: 86 mL/min (by C-G formula based on SCr of 1.2 mg/dL). Liver Function Tests: Recent Labs  Lab 05/12/17 0711 05/13/17 0014  AST 60* 42*  ALT 50 40  ALKPHOS 72 69  BILITOT 1.1 0.9  PROT 7.2 6.7  ALBUMIN 3.7 3.6   Recent Labs  Lab 05/12/17 0711 05/13/17 0014  LIPASE 93* 336*   No results for input(s): AMMONIA in the last 168 hours. Coagulation Profile: No results for input(s): INR, PROTIME in the last 168 hours. Cardiac  Enzymes: No results for input(s): CKTOTAL, CKMB, CKMBINDEX, TROPONINI in the last 168 hours. BNP (last 3 results) No results for input(s): PROBNP in the last 8760 hours. HbA1C: No results for input(s): HGBA1C in the last 72 hours. CBG: No results for input(s): GLUCAP in the last 168 hours. Lipid Profile: No results for input(s): CHOL, HDL, LDLCALC, TRIG, CHOLHDL, LDLDIRECT in the last 72 hours.  Thyroid Function Tests: No results for input(s): TSH, T4TOTAL, FREET4, T3FREE, THYROIDAB in the last 72 hours. Anemia Panel: No results for input(s): VITAMINB12, FOLATE, FERRITIN, TIBC, IRON, RETICCTPCT in the last 72 hours. Urine analysis:    Component Value Date/Time   COLORURINE AMBER (A) 05/13/2017 0543   APPEARANCEUR HAZY (A) 05/13/2017 0543   LABSPEC 1.034 (H) 05/13/2017 0543   PHURINE 5.0 05/13/2017 0543   GLUCOSEU NEGATIVE 05/13/2017 0543   HGBUR MODERATE (A) 05/13/2017 0543   BILIRUBINUR NEGATIVE 05/13/2017 0543   KETONESUR NEGATIVE 05/13/2017 0543   PROTEINUR 30 (A) 05/13/2017 0543   UROBILINOGEN 0.2 01/20/2015 1643   NITRITE NEGATIVE 05/13/2017 0543   LEUKOCYTESUR TRACE (A) 05/13/2017 0543   Sepsis Labs: @LABRCNTIP (procalcitonin:4,lacticidven:4) )No results found for this or any previous visit (from the past 240 hour(s)).   Radiological Exams on Admission: No results found.   Assessment/Plan Principal Problem:   Recurrent acute pancreatitis -Patient with known recurrent pancreatitis since April 2017 in context of prior heavy alcohol abuse -Currently denies alcohol use (including OTC cold preparations such as NyQuil in context of recent bronchitis) but for completeness of evaluation will check serum alcohol level as well as urine drug screen (patient did receive Dilaudid prior to request for this lab) -Patient underwent MRI abdomen by GI previously which does reveal pancreatic divisum -NPO until pain markedly improved then can initiate clears and advance as  tolerated -Once advanced to solid diet resume Creon -Lipase mildly elevated with associated elevated AST which is also concerning for possible concurrent use of alcohol -Repeat lipase in a.m. -Patient appears to be dehydrated so we will continue IV fluids -IV Zofran/Phenergan for nausea -IV Pepcid -IV morphine PRN pain-unable to utilize NSAIDs in the context of reported anaphylactic allergy to diclofenac   Active Problems:   History of nephrolithiasis/current Abnormal urinalysis -Presents with abnormal urinalysis in the context of recurrent nausea and vomiting and likely dehydration -Also has moderate hemoglobin with RBC 6-30 with reports of left quadrant abdominal pain radiating to back-??  Possibly related to renal calculi/renal colic that is mimicking pancreatitis -We will discuss with attending as to whether CT renal stone study indicated -Hydration as above -Urine culture to rule out UTI    Essential hypertension -Patient states was started on losartan 25 mg daily 3 weeks ago -ACE I can precipitate pancreatitis but review of literature does not demonstrate that ARBs do the same -Hold losartan while NPO/get nausea and abdominal pain -Hydralazine IV prn      GERD (gastroesophageal reflux disease) -IV Pepcid as above -Avoid potentially nephrotoxic medications such as PPI in the context of solitary kidney    Congenital single kidney/Left -MRI revealed atrophic right kidney -Avoid nephrotoxic medications as above    High cholesterol -Listed on medical problems but not on medications prior to admission -Check lipid panel especially in context of acute on chronic pancreatitis      DVT prophylaxis: Lovenox Code Status: Full Family Communication: No family at bedside Disposition Plan: Home Consults called: None    Masson Nalepa L. ANP-BC [facility] Pager [phone]   If 7PM-7AM, please contact night-coverage [email or web address] Password [email or web address]  05/13/2017, 9:01 AM

## 2017-05-13 NOTE — Care Management (Signed)
  Pending admission per Dr. Leonides Schanz  47 year old man with past medical history of hypertension, hyperlipidemia, GERD, solitary kidney, kidney stone, alcoholic pancreatitis, who presents with nausea, vomiting or abdominal pain. Patient was in ED yesterday and found to have lipase 93. He was discharged home, but coming back with worsening pain. Lipase is up to 336. Patient states that he did not drink alcohol recently. Patient is placed on MedSurg bed for observation.   Ivor Costa, MD  [facility] Pager [phone]  If 7PM-7AM, please contact night-coverage [email or web address] Password [facility] 05/13/2017, 6:08 AM

## 2017-05-13 NOTE — Progress Notes (Signed)
Kyle Berry is a 47 y.o. male patient admitted from ED awake, alert - oriented  X 4 - no acute distress noted.  VSS - Blood pressure (!) 157/94, pulse 78, temperature 98.1 F (36.7 C), temperature source Oral, resp. rate 18, height 6\' 1"  (1.854 m), weight 83.9 kg (185 lb), SpO2 97 %.    IV in place, occlusive dsg intact without redness.  Orientation to room, and floor completed with information packet given to patient/family.  Patient declined safety video at this time.  Admission INP armband ID verified with patient/family, and in place.   SR up x 2, fall assessment complete, with patient and family able to verbalize understanding of risk associated with falls, and verbalized understanding to call nsg before up out of bed.  Call light within reach, patient able to voice, and demonstrate understanding.  Skin, clean-dry- intact without evidence of bruising, or skin tears.   No evidence of skin break down noted on exam.     Will cont to eval and treat per MD orders.  Celine Ahr, RN 05/13/2017 8:47 AM

## 2017-05-13 NOTE — Progress Notes (Signed)
Pt states he is nauseous and need promethazine instead of zofran.

## 2017-05-14 DIAGNOSIS — K861 Other chronic pancreatitis: Secondary | ICD-10-CM | POA: Diagnosis present

## 2017-05-14 DIAGNOSIS — E785 Hyperlipidemia, unspecified: Secondary | ICD-10-CM | POA: Diagnosis present

## 2017-05-14 DIAGNOSIS — Q600 Renal agenesis, unilateral: Secondary | ICD-10-CM

## 2017-05-14 DIAGNOSIS — K859 Acute pancreatitis without necrosis or infection, unspecified: Secondary | ICD-10-CM | POA: Diagnosis not present

## 2017-05-14 DIAGNOSIS — K858 Other acute pancreatitis without necrosis or infection: Secondary | ICD-10-CM

## 2017-05-14 DIAGNOSIS — N200 Calculus of kidney: Secondary | ICD-10-CM

## 2017-05-14 DIAGNOSIS — K219 Gastro-esophageal reflux disease without esophagitis: Secondary | ICD-10-CM | POA: Diagnosis not present

## 2017-05-14 DIAGNOSIS — E7800 Pure hypercholesterolemia, unspecified: Secondary | ICD-10-CM

## 2017-05-14 DIAGNOSIS — E876 Hypokalemia: Secondary | ICD-10-CM | POA: Diagnosis present

## 2017-05-14 DIAGNOSIS — Z8249 Family history of ischemic heart disease and other diseases of the circulatory system: Secondary | ICD-10-CM | POA: Diagnosis not present

## 2017-05-14 DIAGNOSIS — R1013 Epigastric pain: Secondary | ICD-10-CM | POA: Diagnosis present

## 2017-05-14 DIAGNOSIS — R8290 Unspecified abnormal findings in urine: Secondary | ICD-10-CM | POA: Diagnosis not present

## 2017-05-14 DIAGNOSIS — E781 Pure hyperglyceridemia: Secondary | ICD-10-CM | POA: Diagnosis present

## 2017-05-14 DIAGNOSIS — I10 Essential (primary) hypertension: Secondary | ICD-10-CM | POA: Diagnosis not present

## 2017-05-14 LAB — COMPREHENSIVE METABOLIC PANEL
ALK PHOS: 47 U/L (ref 38–126)
ALT: 24 U/L (ref 17–63)
AST: 22 U/L (ref 15–41)
Albumin: 3.1 g/dL — ABNORMAL LOW (ref 3.5–5.0)
Anion gap: 9 (ref 5–15)
CALCIUM: 8.1 mg/dL — AB (ref 8.9–10.3)
CO2: 25 mmol/L (ref 22–32)
CREATININE: 1.03 mg/dL (ref 0.61–1.24)
Chloride: 105 mmol/L (ref 101–111)
GFR calc Af Amer: >60 mL/min (ref >60)
Glucose, Bld: 92 mg/dL (ref 65–99)
Potassium: 3.4 mmol/L — ABNORMAL LOW (ref 3.5–5.1)
Sodium: 139 mmol/L (ref 135–145)
Total Bilirubin: 1.3 mg/dL — ABNORMAL HIGH (ref 0.3–1.2)
Total Protein: 6 g/dL — ABNORMAL LOW (ref 6.5–8.1)

## 2017-05-14 LAB — URINE CULTURE: CULTURE: NO GROWTH

## 2017-05-14 LAB — CBC
HCT: 37.2 % — ABNORMAL LOW (ref 39.0–52.0)
Hemoglobin: 12.1 g/dL — ABNORMAL LOW (ref 13.0–17.0)
MCH: 32.3 pg (ref 26.0–34.0)
MCHC: 32.5 g/dL (ref 30.0–36.0)
MCV: 99.2 fL (ref 78.0–100.0)
PLATELETS: 182 10*3/uL (ref 150–400)
RBC: 3.75 MIL/uL — AB (ref 4.22–5.81)
RDW: 13.9 % (ref 11.5–15.5)
WBC: 8 10*3/uL (ref 4.0–10.5)

## 2017-05-14 MED ORDER — ENOXAPARIN SODIUM 40 MG/0.4ML ~~LOC~~ SOLN
40.0000 mg | SUBCUTANEOUS | Status: DC
  Administered 2017-05-14 – 2017-05-15 (×2): 40 mg via SUBCUTANEOUS
  Filled 2017-05-14 (×2): qty 0.4

## 2017-05-14 MED ORDER — POTASSIUM CHLORIDE CRYS ER 20 MEQ PO TBCR
40.0000 meq | EXTENDED_RELEASE_TABLET | Freq: Two times a day (BID) | ORAL | Status: AC
  Administered 2017-05-14 (×2): 40 meq via ORAL
  Filled 2017-05-14 (×2): qty 2

## 2017-05-14 NOTE — Progress Notes (Signed)
PROGRESS NOTE  Kyle Berry [id number]:[id number] DOB: 1971/01/16 DOA: 05/13/2017 PCP: Medicine, [facility]  HPI/Recap of past 3 hours: 47 year old man with past medical history of hypertension, hyperlipidemia, GERD, solitary kidney, kidney stone, alcoholic pancreatitis, who presents with nausea, vomiting or abdominal pain. Patient was in ED 05/12/17 and found to have lipase 93. He was discharged home, came back with worsening pain. Lipase is up to 336. No recent use of alcohol. Admitted for acute on chronic pancreatitis.  05/14/17: persistent severe epigastric pain. 9/10 radiating to the back. Denies dyspnea.   Assessment/Plan: Principal Problem:   Recurrent acute pancreatitis Active Problems:   Essential hypertension   GERD (gastroesophageal reflux disease)   Congenital single kidney   High cholesterol   Abnormal urinalysis   Nephrolithiasis  Acute on chronic pancreatitis -continue NPO except sips and meds -continue supportive care and pain management -daily reassessment -IV zofran prn, IV phenergan prn -IV morphine prn, oxycodone prn  GERD -pepcid  Concern for alcohol withdrawal -CIWA protocol\  Hypokalemia -k+ 3.4 -repleted -BMP am    Code Status:  full  Family Communication: none at bedside  Disposition Plan: will stay another midnight until can tolerate po    Consultants:  none  Procedures: None  Antimicrobials:  none  DVT prophylaxis:  lovenox sq   Objective: Vitals:   05/13/17 0715 05/13/17 0808 05/13/17 1351 05/13/17 2145  BP: (!) 147/88 (!) 157/94 (!) 138/95 (!) 143/92  Pulse: 84 78 72 86  Resp: 18 18  18   Temp:  98.1 F (36.7 C) 98.3 F (36.8 C) 98.8 F (37.1 C)  TempSrc:  Oral Oral Oral  SpO2: 98% 97% 96% 99%  Weight:    80.9 kg (178 lb 5.6 oz)  Height:  6\' 1"  (1.854 m)      Intake/Output Summary (Last 24 hours) at 05/14/2017 0848 Last data filed at 05/14/2017 0400 Gross per 24 hour  Intake 1782.08 ml  Output  875 ml  Net 907.08 ml   Filed Weights   05/13/17 0009 05/13/17 0014 05/13/17 2145  Weight: 85.3 kg (188 lb) 83.9 kg (185 lb) 80.9 kg (178 lb 5.6 oz)    Exam:   General:  47 yo AAM WD WN appears uncomfortable due to epigastric pain   Cardiovascular: RRR no rubs or gallops   Respiratory: CTA no rales or wheezes   Abdomen: tender palpation epigastric region  Musculoskeletal: non focal no LE edema  Skin: no rash  Psychiatry: mood appropriate for condition and setting   Data Reviewed: CBC: Recent Labs  Lab 05/12/17 0711 05/13/17 0014 05/14/17 0225  WBC 9.3 11.2* 8.0  HGB 14.6 13.9 12.1*  HCT 43.0 40.9 37.2*  MCV 97.1 96.2 99.2  PLT 208 199 497   Basic Metabolic Panel: Recent Labs  Lab 05/12/17 0711 05/13/17 0014 05/14/17 0225  NA 135 138 139  K 4.3 3.5 3.4*  CL 99* 104 105  CO2 22 22 25   GLUCOSE 117* 120* 92  BUN 11 8 <5*  CREATININE 1.13 1.20 1.03  CALCIUM 9.1 8.8* 8.1*   GFR: Estimated Creatinine Clearance: 100.2 mL/min (by C-G formula based on SCr of 1.03 mg/dL). Liver Function Tests: Recent Labs  Lab 05/12/17 0711 05/13/17 0014 05/14/17 0225  AST 60* 42* 22  ALT 50 40 24  ALKPHOS 72 69 47  BILITOT 1.1 0.9 1.3*  PROT 7.2 6.7 6.0*  ALBUMIN 3.7 3.6 3.1*   Recent Labs  Lab 05/12/17 0711 05/13/17 0014  LIPASE 93* 336*  No results for input(s): AMMONIA in the last 168 hours. Coagulation Profile: No results for input(s): INR, PROTIME in the last 168 hours. Cardiac Enzymes: No results for input(s): CKTOTAL, CKMB, CKMBINDEX, TROPONINI in the last 168 hours. BNP (last 3 results) No results for input(s): PROBNP in the last 8760 hours. HbA1C: No results for input(s): HGBA1C in the last 72 hours. CBG: No results for input(s): GLUCAP in the last 168 hours. Lipid Profile: No results for input(s): CHOL, HDL, LDLCALC, TRIG, CHOLHDL, LDLDIRECT in the last 72 hours. Thyroid Function Tests: No results for input(s): TSH, T4TOTAL, FREET4, T3FREE,  THYROIDAB in the last 72 hours. Anemia Panel: No results for input(s): VITAMINB12, FOLATE, FERRITIN, TIBC, IRON, RETICCTPCT in the last 72 hours. Urine analysis:    Component Value Date/Time   COLORURINE AMBER (A) 05/13/2017 0543   APPEARANCEUR HAZY (A) 05/13/2017 0543   LABSPEC 1.034 (H) 05/13/2017 0543   PHURINE 5.0 05/13/2017 0543   GLUCOSEU NEGATIVE 05/13/2017 0543   HGBUR MODERATE (A) 05/13/2017 0543   BILIRUBINUR NEGATIVE 05/13/2017 0543   KETONESUR NEGATIVE 05/13/2017 0543   PROTEINUR 30 (A) 05/13/2017 0543   UROBILINOGEN 0.2 01/20/2015 1643   NITRITE NEGATIVE 05/13/2017 0543   LEUKOCYTESUR TRACE (A) 05/13/2017 0543   Sepsis Labs: @LABRCNTIP (procalcitonin:4,lacticidven:4)  )No results found for this or any previous visit (from the past 240 hour(s)).    Studies: No results found.  Scheduled Meds: . potassium chloride  40 mEq Oral BID    Continuous Infusions: . sodium chloride 200 mL/hr at 05/14/17 0752  . famotidine (PEPCID) IV Stopped (05/14/17 0114)     LOS: 0 days     Kayleen Memos, MD [facility] Pager [phone]  If 7PM-7AM, please contact night-coverage [email or web address] Password [facility] 05/14/2017, 8:48 AM

## 2017-05-15 DIAGNOSIS — K859 Acute pancreatitis without necrosis or infection, unspecified: Principal | ICD-10-CM

## 2017-05-15 DIAGNOSIS — I10 Essential (primary) hypertension: Secondary | ICD-10-CM

## 2017-05-15 DIAGNOSIS — K219 Gastro-esophageal reflux disease without esophagitis: Secondary | ICD-10-CM

## 2017-05-15 LAB — BASIC METABOLIC PANEL
ANION GAP: 11 (ref 5–15)
BUN: 5 mg/dL — AB (ref 6–20)
CHLORIDE: 106 mmol/L (ref 101–111)
CO2: 21 mmol/L — ABNORMAL LOW (ref 22–32)
Calcium: 8.4 mg/dL — ABNORMAL LOW (ref 8.9–10.3)
Creatinine, Ser: 1.11 mg/dL (ref 0.61–1.24)
GFR calc non Af Amer: >60 mL/min (ref >60)
Glucose, Bld: 71 mg/dL (ref 65–99)
POTASSIUM: 3.7 mmol/L (ref 3.5–5.1)
SODIUM: 138 mmol/L (ref 135–145)

## 2017-05-15 MED ORDER — VITAMIN B-1 100 MG PO TABS
100.0000 mg | ORAL_TABLET | Freq: Every day | ORAL | Status: DC
  Administered 2017-05-15: 100 mg via ORAL
  Filled 2017-05-15: qty 1

## 2017-05-15 MED ORDER — TRAMADOL HCL 50 MG PO TABS
50.0000 mg | ORAL_TABLET | Freq: Four times a day (QID) | ORAL | Status: DC | PRN
  Administered 2017-05-15 – 2017-05-16 (×4): 50 mg via ORAL
  Filled 2017-05-15 (×5): qty 1

## 2017-05-15 MED ORDER — ADULT MULTIVITAMIN W/MINERALS CH
1.0000 | ORAL_TABLET | Freq: Every day | ORAL | Status: DC
  Administered 2017-05-15: 1 via ORAL
  Filled 2017-05-15: qty 1

## 2017-05-15 MED ORDER — OXYCODONE HCL 5 MG PO TABS: 5.0000 mg | ORAL_TABLET | ORAL | Status: DC | PRN

## 2017-05-15 MED ORDER — PANCRELIPASE (LIP-PROT-AMYL) 12000-38000 UNITS PO CPEP
36000.0000 [IU] | ORAL_CAPSULE | ORAL | Status: DC
  Administered 2017-05-15: 36000 [IU] via ORAL
  Filled 2017-05-15: qty 3

## 2017-05-15 MED ORDER — FOLIC ACID 1 MG PO TABS
1.0000 mg | ORAL_TABLET | Freq: Every day | ORAL | Status: DC
  Administered 2017-05-15: 1 mg via ORAL
  Filled 2017-05-15: qty 1

## 2017-05-15 MED ORDER — LORAZEPAM 1 MG PO TABS: 1.0000 mg | ORAL_TABLET | Freq: Four times a day (QID) | ORAL | Status: DC | PRN

## 2017-05-15 MED ORDER — THIAMINE HCL 100 MG/ML IJ SOLN: 100.0000 mg | Freq: Every day | INTRAMUSCULAR | Status: DC

## 2017-05-15 MED ORDER — LORAZEPAM 2 MG/ML IJ SOLN
1.0000 mg | Freq: Four times a day (QID) | INTRAMUSCULAR | Status: DC | PRN
Start: 2017-05-15 — End: 2017-05-15

## 2017-05-15 NOTE — Progress Notes (Signed)
Patient ID: Kyle Berry, male   DOB: [birth year], 47 y.o.   MRN: [id number]  PROGRESS NOTE    Kyle Berry  RCV:[id number] DOB: 08/25/1970 DOA: 05/13/2017 PCP: Medicine, [facility]   Brief Narrative:  47 year old male with history of hypertension, hyperlipidemia, GERD, solitary kidney, kidney stone, alcoholic parotitis presented with nausea, vomiting and abdominal pain she was admitted with acute on chronic pancreatitis.   Assessment & Plan:   Principal Problem:   Recurrent acute pancreatitis Active Problems:   Essential hypertension   Recurrent pancreatitis (HCC)   GERD (gastroesophageal reflux disease)   Congenital single kidney   High cholesterol   Abnormal urinalysis   Nephrolithiasis    Acute on chronic pancreatitis -Patient states that the pain is improving.  Currently n.p.o.  Will start clear liquid diet and advance to full liquid diet tonight tolerated.   -Continue pain management and IV fluids  -Outpatient follow-up with GI  GERD -Continue Pepcid  History of alcohol abuse in the past -Patient states that he is not been drinking alcohol lately.  No signs of withdrawal.  Will discontinue CIWA protocol  Hypokalemia -Improved.   DVT prophylaxis: Lovenox Code Status: Full  Family Communication: none at bedside Disposition Plan: Home in 1-2 days  Consultants: None  Procedures: None  Antimicrobials: None   Subjective: Patient seen and examined at bedside.  He feels better.  Still having intermittent abdominal pain.  No overnight fever or vomiting.  Objective: Vitals:   05/14/17 1450 05/14/17 2135 05/15/17 0518 05/15/17 0552  BP: 119/82 (!) 145/100 139/72   Pulse: 82 71 65   Resp: 18 18 16    Temp: 99 F (37.2 C) 98.9 F (37.2 C) 98.5 F (36.9 C)   TempSrc: Oral Oral Oral   SpO2: 96% 95% 98%   Weight:    79.2 kg (174 lb 11.2 oz)  Height:        Intake/Output Summary (Last 24 hours) at 05/15/2017 1221 Last data filed at  05/15/2017 1018 Gross per 24 hour  Intake 7130 ml  Output 2745 ml  Net 4385 ml   Filed Weights   05/13/17 0014 05/13/17 2145 05/15/17 0552  Weight: 83.9 kg (185 lb) 80.9 kg (178 lb 5.6 oz) 79.2 kg (174 lb 11.2 oz)    Examination:  General exam: Appears calm and comfortable  Respiratory system: Bilateral decreased breath sound at bases Cardiovascular system: S1 & S2 heard, rate controlled  gastrointestinal system: Abdomen is nondistended, soft and mildly tender in the epigastric region. Normal bowel sounds heard. Extremities: No cyanosis, clubbing, edema    Data Reviewed: I have personally reviewed following labs and imaging studies  CBC: Recent Labs  Lab 05/12/17 0711 05/13/17 0014 05/14/17 0225  WBC 9.3 11.2* 8.0  HGB 14.6 13.9 12.1*  HCT 43.0 40.9 37.2*  MCV 97.1 96.2 99.2  PLT 208 199 102   Basic Metabolic Panel: Recent Labs  Lab 05/12/17 0711 05/13/17 0014 05/14/17 0225 05/15/17 0824  NA 135 138 139 138  K 4.3 3.5 3.4* 3.7  CL 99* 104 105 106  CO2 22 22 25  21*  GLUCOSE 117* 120* 92 71  BUN 11 8 <5* 5*  CREATININE 1.13 1.20 1.03 1.11  CALCIUM 9.1 8.8* 8.1* 8.4*   GFR: Estimated Creatinine Clearance: 92.2 mL/min (by C-G formula based on SCr of 1.11 mg/dL). Liver Function Tests: Recent Labs  Lab 05/12/17 0711 05/13/17 0014 05/14/17 0225  AST 60* 42* 22  ALT 50 40 24  ALKPHOS  72 69 47  BILITOT 1.1 0.9 1.3*  PROT 7.2 6.7 6.0*  ALBUMIN 3.7 3.6 3.1*   Recent Labs  Lab 05/12/17 0711 05/13/17 0014  LIPASE 93* 336*   No results for input(s): AMMONIA in the last 168 hours. Coagulation Profile: No results for input(s): INR, PROTIME in the last 168 hours. Cardiac Enzymes: No results for input(s): CKTOTAL, CKMB, CKMBINDEX, TROPONINI in the last 168 hours. BNP (last 3 results) No results for input(s): PROBNP in the last 8760 hours. HbA1C: No results for input(s): HGBA1C in the last 72 hours. CBG: No results for input(s): GLUCAP in the last 168  hours. Lipid Profile: No results for input(s): CHOL, HDL, LDLCALC, TRIG, CHOLHDL, LDLDIRECT in the last 72 hours. Thyroid Function Tests: No results for input(s): TSH, T4TOTAL, FREET4, T3FREE, THYROIDAB in the last 72 hours. Anemia Panel: No results for input(s): VITAMINB12, FOLATE, FERRITIN, TIBC, IRON, RETICCTPCT in the last 72 hours. Sepsis Labs: No results for input(s): PROCALCITON, LATICACIDVEN in the last 168 hours.  Recent Results (from the past 240 hour(s))  Culture, Urine     Status: None   Collection Time: 05/13/17 10:39 AM  Result Value Ref Range Status   Specimen Description URINE, CLEAN CATCH  Final   Special Requests NONE  Final   Culture NO GROWTH  Final   Report Status 05/14/2017 FINAL  Final         Radiology Studies: No results found.      Scheduled Meds: . enoxaparin (LOVENOX) injection  40 mg Subcutaneous Q24H   Continuous Infusions: . sodium chloride 200 mL/hr at 05/15/17 0607  . famotidine (PEPCID) IV 20 mg (05/15/17 0901)     LOS: 1 day        Aline August, MD [facility] Pager [phone]  If 7PM-7AM, please contact night-coverage [email or web address] Password [email or web address] 05/15/2017, 12:21 PM

## 2017-05-16 ENCOUNTER — Ambulatory Visit: Payer: Self-pay | Admitting: Nurse Practitioner

## 2017-05-16 LAB — COMPREHENSIVE METABOLIC PANEL
ALBUMIN: 2.9 g/dL — AB (ref 3.5–5.0)
ALT: 21 U/L (ref 17–63)
ANION GAP: 11 (ref 5–15)
AST: 23 U/L (ref 15–41)
Alkaline Phosphatase: 63 U/L (ref 38–126)
BUN: <5 mg/dL — ABNORMAL LOW (ref 6–20)
CO2: 25 mmol/L (ref 22–32)
Calcium: 8.3 mg/dL — ABNORMAL LOW (ref 8.9–10.3)
Chloride: 104 mmol/L (ref 101–111)
Creatinine, Ser: 1.09 mg/dL (ref 0.61–1.24)
GLUCOSE: 104 mg/dL — AB (ref 65–99)
POTASSIUM: 3.7 mmol/L (ref 3.5–5.1)
Sodium: 140 mmol/L (ref 135–145)
TOTAL PROTEIN: 5.8 g/dL — AB (ref 6.5–8.1)
Total Bilirubin: 0.2 mg/dL — ABNORMAL LOW (ref 0.3–1.2)

## 2017-05-16 LAB — CBC WITH DIFFERENTIAL/PLATELET
BASOS PCT: 0 %
Basophils Absolute: 0 10*3/uL (ref 0.0–0.1)
Eosinophils Absolute: 0.1 10*3/uL (ref 0.0–0.7)
Eosinophils Relative: 2 %
HEMATOCRIT: 35.1 % — AB (ref 39.0–52.0)
Hemoglobin: 11.5 g/dL — ABNORMAL LOW (ref 13.0–17.0)
Lymphocytes Relative: 45 %
Lymphs Abs: 2.7 10*3/uL (ref 0.7–4.0)
MCH: 32.5 pg (ref 26.0–34.0)
MCHC: 32.8 g/dL (ref 30.0–36.0)
MCV: 99.2 fL (ref 78.0–100.0)
MONO ABS: 0.3 10*3/uL (ref 0.1–1.0)
MONOS PCT: 5 %
NEUTROS ABS: 2.8 10*3/uL (ref 1.7–7.7)
Neutrophils Relative %: 48 %
Platelets: 188 10*3/uL (ref 150–400)
RBC: 3.54 MIL/uL — ABNORMAL LOW (ref 4.22–5.81)
RDW: 13.5 % (ref 11.5–15.5)
WBC: 5.9 10*3/uL (ref 4.0–10.5)

## 2017-05-16 LAB — MAGNESIUM: MAGNESIUM: 1.9 mg/dL (ref 1.7–2.4)

## 2017-05-16 MED ORDER — FAMOTIDINE 20 MG PO TABS: 20.0000 mg | ORAL_TABLET | Freq: Two times a day (BID) | ORAL | 0 refills | Status: DC

## 2017-05-16 MED ORDER — FAMOTIDINE 20 MG PO TABS
20.0000 mg | ORAL_TABLET | Freq: Two times a day (BID) | ORAL | Status: DC
  Administered 2017-05-16: 20 mg via ORAL
  Filled 2017-05-16: qty 1

## 2017-05-16 MED ORDER — TRAMADOL HCL 50 MG PO TABS: 50.0000 mg | ORAL_TABLET | Freq: Four times a day (QID) | ORAL | 0 refills | Status: DC | PRN

## 2017-05-16 NOTE — Discharge Summary (Signed)
Physician Discharge Summary  Kyle Berry [id number]:[id number] DOB: 06/15/1970 DOA: 05/13/2017  PCP: [facility], [facility]  Admit date: 05/13/2017 Discharge date: 05/16/2017  Admitted From: Home Disposition: Home  Recommendations for Outpatient Follow-up:  1. Follow up with PCP in 1 week 2. Follow-up with gastroenterology in 1-2 weeks 3. Abstain from alcohol   Home Health: No Equipment/Devices: None  Discharge Condition: Stable CODE STATUS: Full  diet recommendation: Heart Healthy   Brief/Interim Summary: 47 year old male with history of hypertension, hyperlipidemia, GERD, solitary kidney, kidney stone, alcoholic parotitis presented with nausea, vomiting and abdominal pain she was admitted with acute on chronic pancreatitis.  He was started on IV fluids and pain medications.  Initially he was kept n.p.o. but with improvement his diet was advanced.  He is currently tolerating diet.  Abdominal pain is improving.  He will be discharged home with outpatient follow-up with primary care provider and/or gastroenterology.     Discharge Diagnoses:  Principal Problem:   Recurrent acute pancreatitis Active Problems:   Essential hypertension   Recurrent pancreatitis (HCC)   GERD (gastroesophageal reflux disease)   Congenital single kidney   High cholesterol   Abnormal urinalysis   Nephrolithiasis   Acute on chronic pancreatitis -He was started on IV fluids and pain medications.  Initially he was kept n.p.o. but with improvement his diet was advanced.  He is currently tolerating diet.  Abdominal pain is improving.  He will be discharged home with outpatient follow-up with primary care provider and/or gastroenterology. -Continue Creon  GERD -Continue Pepcid  History of alcohol abuse in the past -Patient states that he is not been drinking alcohol lately.  No signs of withdrawal.    Hypokalemia -Improved.    Discharge Instructions  Discharge Instructions     Ambulatory referral to Gastroenterology   Complete by:  As directed    followup for pancreatitis/hospital admission   Call MD for:  difficulty breathing, headache or visual disturbances   Complete by:  As directed    Call MD for:  extreme fatigue   Complete by:  As directed    Call MD for:  hives   Complete by:  As directed    Call MD for:  persistant dizziness or light-headedness   Complete by:  As directed    Call MD for:  persistant nausea and vomiting   Complete by:  As directed    Call MD for:  severe uncontrolled pain   Complete by:  As directed    Call MD for:  temperature >100.4   Complete by:  As directed    Diet - low sodium heart healthy   Complete by:  As directed    Increase activity slowly   Complete by:  As directed      Allergies as of 05/16/2017      Reactions   Diclofenac Hives   Norvasc [amlodipine Besylate] Other (See Comments)   Fluid buildup in chest   Omeprazole Other (See Comments)   MD stopped due to pancreatitis   Prednisone Other (See Comments)   Mood swings      Medication List    STOP taking these medications   guaiFENesin 100 MG/5ML liquid Commonly known as:  ROBITUSSIN   ibuprofen 200 MG tablet Commonly known as:  ADVIL,MOTRIN   oxyCODONE-acetaminophen 5-325 MG tablet Commonly known as:  ROXICET     TAKE these medications   famotidine 20 MG tablet Commonly known as:  PEPCID Take 1 tablet (20 mg total) by mouth  2 (two) times daily for 15 days. What changed:    medication strength  how much to take  when to take this  reasons to take this   feeding supplement Liqd Take 1 Container by mouth 3 (three) times daily between meals.   lipase/protease/amylase 36000 UNITS Cpep capsule Commonly known as:  CREON Take 1 capsule (36,000 Units total) by mouth as directed. Take two with meals and one with snacks What changed:    when to take this  additional instructions   losartan 25 MG tablet Commonly known as:  COZAAR Take 25  mg by mouth daily.   ondansetron 4 MG disintegrating tablet Commonly known as:  ZOFRAN ODT Take 1 tablet (4 mg total) by mouth every 8 (eight) hours as needed for nausea or vomiting.   promethazine 12.5 MG tablet Commonly known as:  PHENERGAN Take 1 tablet (12.5 mg total) by mouth every 6 (six) hours as needed for nausea, vomiting or refractory nausea / vomiting.   traMADol 50 MG tablet Commonly known as:  ULTRAM Take 1 tablet (50 mg total) by mouth every 6 (six) hours as needed for moderate pain.       Allergies  Allergen Reactions  . Diclofenac Hives  . Norvasc [Amlodipine Besylate] Other (See Comments)    Fluid buildup in chest  . Omeprazole Other (See Comments)    MD stopped due to pancreatitis  . Prednisone Other (See Comments)    Mood swings    Consultations:  None   Procedures/Studies:  No results found.   Subjective: Patient seen and examined at bedside.  He feels better.  His abdominal pain is improving.  He is tolerating diet.  He wants to go home.  Discharge Exam: Vitals:   05/15/17 2120 05/16/17 0552  BP: (!) 145/87 (!) 138/91  Pulse: 79 63  Resp: 16 16  Temp: 98.1 F (36.7 C) 98 F (36.7 C)  SpO2: 97% 98%   Vitals:   05/15/17 0552 05/15/17 1518 05/15/17 2120 05/16/17 0552  BP:  133/87 (!) 145/87 (!) 138/91  Pulse:  73 79 63  Resp:  20 16 16   Temp:  98.7 F (37.1 C) 98.1 F (36.7 C) 98 F (36.7 C)  TempSrc:  Oral    SpO2:  97% 97% 98%  Weight: 79.2 kg (174 lb 11.2 oz)   79.5 kg (175 lb 4.3 oz)  Height:        General: Pt is alert, awake, not in acute distress Cardiovascular: Rate controlled, S1/S2 + Respiratory: Bilateral decreased breath sounds at bases Abdominal: Soft, very minimally tender in the epigastric region, ND, bowel sounds + Extremities: no edema, no cyanosis    The results of significant diagnostics from this hospitalization (including imaging, microbiology, ancillary and laboratory) are listed below for reference.      Microbiology: Recent Results (from the past 240 hour(s))  Culture, Urine     Status: None   Collection Time: 05/13/17 10:39 AM  Result Value Ref Range Status   Specimen Description URINE, CLEAN CATCH  Final   Special Requests NONE  Final   Culture NO GROWTH  Final   Report Status 05/14/2017 FINAL  Final     Labs: BNP (last 3 results) No results for input(s): BNP in the last 8760 hours. Basic Metabolic Panel: Recent Labs  Lab 05/12/17 0711 05/13/17 0014 05/14/17 0225 05/15/17 0824 05/16/17 0421  NA 135 138 139 138 140  K 4.3 3.5 3.4* 3.7 3.7  CL 99* 104 105 106  104  CO2 22 22 25  21* 25  GLUCOSE 117* 120* 92 71 104*  BUN 11 8 <5* 5* <5*  CREATININE 1.13 1.20 1.03 1.11 1.09  CALCIUM 9.1 8.8* 8.1* 8.4* 8.3*  MG  --   --   --   --  1.9   Liver Function Tests: Recent Labs  Lab 05/12/17 0711 05/13/17 0014 05/14/17 0225 05/16/17 0421  AST 60* 42* 22 23  ALT 50 40 24 21  ALKPHOS 72 69 47 63  BILITOT 1.1 0.9 1.3* 0.2*  PROT 7.2 6.7 6.0* 5.8*  ALBUMIN 3.7 3.6 3.1* 2.9*   Recent Labs  Lab 05/12/17 0711 05/13/17 0014  LIPASE 93* 336*   No results for input(s): AMMONIA in the last 168 hours. CBC: Recent Labs  Lab 05/12/17 0711 05/13/17 0014 05/14/17 0225 05/16/17 0421  WBC 9.3 11.2* 8.0 5.9  NEUTROABS  --   --   --  2.8  HGB 14.6 13.9 12.1* 11.5*  HCT 43.0 40.9 37.2* 35.1*  MCV 97.1 96.2 99.2 99.2  PLT 208 199 182 188   Cardiac Enzymes: No results for input(s): CKTOTAL, CKMB, CKMBINDEX, TROPONINI in the last 168 hours. BNP: Invalid input(s): POCBNP CBG: No results for input(s): GLUCAP in the last 168 hours. D-Dimer No results for input(s): DDIMER in the last 72 hours. Hgb A1c No results for input(s): HGBA1C in the last 72 hours. Lipid Profile No results for input(s): CHOL, HDL, LDLCALC, TRIG, CHOLHDL, LDLDIRECT in the last 72 hours. Thyroid function studies No results for input(s): TSH, T4TOTAL, T3FREE, THYROIDAB in the last 72 hours.  Invalid  input(s): FREET3 Anemia work up No results for input(s): VITAMINB12, FOLATE, FERRITIN, TIBC, IRON, RETICCTPCT in the last 72 hours. Urinalysis    Component Value Date/Time   COLORURINE AMBER (A) 05/13/2017 0543   APPEARANCEUR HAZY (A) 05/13/2017 0543   LABSPEC 1.034 (H) 05/13/2017 0543   PHURINE 5.0 05/13/2017 0543   GLUCOSEU NEGATIVE 05/13/2017 0543   HGBUR MODERATE (A) 05/13/2017 0543   BILIRUBINUR NEGATIVE 05/13/2017 0543   KETONESUR NEGATIVE 05/13/2017 0543   PROTEINUR 30 (A) 05/13/2017 0543   UROBILINOGEN 0.2 01/20/2015 1643   NITRITE NEGATIVE 05/13/2017 0543   LEUKOCYTESUR TRACE (A) 05/13/2017 0543   Sepsis Labs Invalid input(s): PROCALCITONIN,  WBC,  LACTICIDVEN Microbiology Recent Results (from the past 240 hour(s))  Culture, Urine     Status: None   Collection Time: 05/13/17 10:39 AM  Result Value Ref Range Status   Specimen Description URINE, CLEAN CATCH  Final   Special Requests NONE  Final   Culture NO GROWTH  Final   Report Status 05/14/2017 FINAL  Final     Time coordinating discharge: 35 minutes  SIGNED:   Aline August, MD  [facility] 05/16/2017, 9:12 AM Pager: [phone]  If 7PM-7AM, please contact night-coverage [email or web address] Password [email or web address]

## 2017-05-16 NOTE — Progress Notes (Signed)
Patient discharge teaching given, including activity, diet, follow-up appoints, and medications. Patient verbalized understanding of all discharge instructions. IV access was d/c'd. Vitals are stable. Skin is intact except as charted in most recent assessments. Pt to be escorted out by NT, to be driven home by family.  Pt waiting on Sister for pickup.

## 2017-05-22 ENCOUNTER — Encounter: Payer: Self-pay | Admitting: Nurse Practitioner

## 2017-05-28 ENCOUNTER — Ambulatory Visit: Payer: Managed Care, Other (non HMO) | Admitting: Nurse Practitioner

## 2017-05-31 ENCOUNTER — Ambulatory Visit (INDEPENDENT_AMBULATORY_CARE_PROVIDER_SITE_OTHER): Payer: Managed Care, Other (non HMO) | Admitting: Nurse Practitioner

## 2017-05-31 VITALS — BP 128/80 | HR 98 | Ht 73.0 in | Wt 178.0 lb

## 2017-05-31 DIAGNOSIS — K859 Acute pancreatitis without necrosis or infection, unspecified: Secondary | ICD-10-CM | POA: Diagnosis not present

## 2017-05-31 NOTE — Patient Instructions (Signed)
If you are age 48 or older, your body mass index should be between 23-30. Your Body mass index is 23.48 kg/m. If this is out of the aforementioned range listed, please consider follow up with your Primary Care Provider.  If you are age 59 or younger, your body mass index should be between 19-25. Your Body mass index is 23.48 kg/m. If this is out of the aformentioned range listed, please consider follow up with your Primary Care Provider.   Call Primary Care Physician about stopping Losartan.  Thank you for choosing me and [facility].   Tye Savoy, NP

## 2017-05-31 NOTE — Progress Notes (Signed)
IMPRESSION and PLAN:    Pleasant 47 yo male with hx of recurrent acute pancreatitis resulting in multiple hospital admissions over last 2 years but he recalls having similar episodes of pain most of his life. He has a hx of heavy ETOH, felt to be cause of first episode of acute pancreatitis in 2017. Subsequent episodes in absence of ETOH per patient. Most recent admission for acute pancreatitis was late January and possibly secondary to ACEI . 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 No evidence for gallstones on any imaging.  -Kyle Berry's symptoms have resolved, he is tolerating a regular diet. No abdominal pain.  -Losartan was resumed at time of hospital discharged. Asked patient to contact PCP about changing BP med given that Losartan has be associated with pancreatitis.  -He may need further workup for pancreatic divisum Also, autoimmune etiology? -Should still avoid ETOH    HPI:    Chief Complaint: follow up after hospitalization   Patient is a 47 yo male known to Dr. Silverio Decamp. He has a hx of recurrent acute pancreatitis requiring several hospital admissions.  The first episode was in 2017 and probably ETOH related but subsequent episodes were in absence of alcohol. No cholelithiasis on CT scans nor MRCP.  Liver chemistries have been unremarkable except for intermittent mild hyperbilirubinemia. 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 Kyle Berry is here for a hospital follow up. He was hospitalized again in January with recurrent pancreatitis. We did not see him that admission but he was told to follow up with [place]  Kyle Berry started losartan about 3 weeks ago and a week later developed the same epigastric / LUQ pain reminiscent of pancreatitis. Pain initally intermittent but became constant with radiation through to his back with associated nausea. Went to ED 1/128/19, released home as labs were unremarkable. Went back to ED the next day with vomiting and  persistent pain. Lipase elevated `~ 330 that time and he was admitted for 5 days. He was discharged the end of January and no pain since. Eating regular diet. He hasn't had ANY ETOH in months he says. ETOH level was < 10 in hospital. His Losartan was restarted at time of discharge.     Past Medical History:  Diagnosis Date  . Acute pancreatitis 11/23/2015  . Chronic bronchitis ([place])   . Chronic lower back pain   . Congenital single kidney   . GERD (gastroesophageal reflux disease)   . Headache    "once/month" (05/13/2017)  . High cholesterol   . History of kidney stones   . Hypertension   . Migraine    "a couple/year" (05/13/2017)  . Pneumonia ~ 09/2015  . Recurrent acute pancreatitis   . Renal disorder     Patient's surgical history, family medical history, social history, medications and allergies were all reviewed in Epic    Review of systems:  No chest pain. No sob. No significant weight loss.   Physical Exam:     BP 128/80   Pulse 98   Ht 6\' 1"  (1.854 m)   Wt 178 lb (80.7 kg)   BMI 23.48 kg/m   GENERAL:  Well developed black male in NAD PSYCH: :Pleasant, cooperative, normal affect EENT:  conjunctiva pink, mucous membranes moist, neck supple without masses CARDIAC:  RRR, no murmur heard, no peripheral edema PULM: Normal respiratory effort, lungs CTA bilaterally, no wheezing ABDOMEN:  Nondistended, soft, nontender. No obvious masses, no hepatomegaly,  normal bowel sounds SKIN:  turgor, no lesions seen Musculoskeletal:  Normal muscle tone, normal strength NEURO: Alert and oriented x 3, no focal neurologic deficits   Tye Savoy , NP 05/31/2017, 1:41 PM

## 2017-06-03 ENCOUNTER — Encounter: Payer: Self-pay | Admitting: Nurse Practitioner

## 2017-06-04 NOTE — Progress Notes (Signed)
Reviewed and agree with documentation and assessment and plan. K. Veena Alfard Cochrane , MD   

## 2017-08-13 ENCOUNTER — Other Ambulatory Visit: Payer: Self-pay

## 2017-08-13 ENCOUNTER — Emergency Department (HOSPITAL_COMMUNITY)
Admission: EM | Admit: 2017-08-13 | Discharge: 2017-08-13 | Disposition: A | Discharge status: Home / Self Care | Payer: Managed Care, Other (non HMO) | Attending: Emergency Medicine | Admitting: Emergency Medicine

## 2017-08-13 ENCOUNTER — Encounter (HOSPITAL_COMMUNITY): Payer: Self-pay

## 2017-08-13 DIAGNOSIS — I10 Essential (primary) hypertension: Secondary | ICD-10-CM | POA: Insufficient documentation

## 2017-08-13 DIAGNOSIS — Z79899 Other long term (current) drug therapy: Secondary | ICD-10-CM | POA: Diagnosis not present

## 2017-08-13 DIAGNOSIS — R1012 Left upper quadrant pain: Secondary | ICD-10-CM | POA: Insufficient documentation

## 2017-08-13 LAB — CBC
HCT: 36.2 % — ABNORMAL LOW (ref 39.0–52.0)
Hemoglobin: 12.4 g/dL — ABNORMAL LOW (ref 13.0–17.0)
MCH: 32.6 pg (ref 26.0–34.0)
MCHC: 34.3 g/dL (ref 30.0–36.0)
MCV: 95.3 fL (ref 78.0–100.0)
PLATELETS: 196 10*3/uL (ref 150–400)
RBC: 3.8 MIL/uL — ABNORMAL LOW (ref 4.22–5.81)
RDW: 14.8 % (ref 11.5–15.5)
WBC: 6.7 10*3/uL (ref 4.0–10.5)

## 2017-08-13 LAB — URINALYSIS, ROUTINE W REFLEX MICROSCOPIC
Bilirubin Urine: NEGATIVE
GLUCOSE, UA: NEGATIVE mg/dL
Ketones, ur: 5 mg/dL — AB
Leukocytes, UA: NEGATIVE
Nitrite: NEGATIVE
PH: 5 (ref 5.0–8.0)
PROTEIN: 30 mg/dL — AB
Specific Gravity, Urine: 1.031 — ABNORMAL HIGH (ref 1.005–1.030)

## 2017-08-13 LAB — COMPREHENSIVE METABOLIC PANEL
ALBUMIN: 3.5 g/dL (ref 3.5–5.0)
ALK PHOS: 78 U/L (ref 38–126)
ALT: 39 U/L (ref 17–63)
AST: 40 U/L (ref 15–41)
Anion gap: 8 (ref 5–15)
BUN: 10 mg/dL (ref 6–20)
CALCIUM: 8.6 mg/dL — AB (ref 8.9–10.3)
CO2: 24 mmol/L (ref 22–32)
CREATININE: 1.24 mg/dL (ref 0.61–1.24)
Chloride: 102 mmol/L (ref 101–111)
GFR calc Af Amer: >60 mL/min (ref >60)
GFR calc non Af Amer: >60 mL/min (ref >60)
GLUCOSE: 99 mg/dL (ref 65–99)
Potassium: 4.1 mmol/L (ref 3.5–5.1)
SODIUM: 134 mmol/L — AB (ref 135–145)
Total Bilirubin: 1 mg/dL (ref 0.3–1.2)
Total Protein: 6.8 g/dL (ref 6.5–8.1)

## 2017-08-13 LAB — LIPASE, BLOOD: Lipase: 39 U/L (ref 11–51)

## 2017-08-13 MED ORDER — SODIUM CHLORIDE 0.9 % IV BOLUS
1000.0000 mL | Freq: Once | INTRAVENOUS | Status: AC
  Administered 2017-08-13: 1000 mL via INTRAVENOUS

## 2017-08-13 MED ORDER — PROMETHAZINE HCL 25 MG PO TABS: 25.0000 mg | ORAL_TABLET | Freq: Four times a day (QID) | ORAL | 0 refills | Status: DC | PRN

## 2017-08-13 MED ORDER — PROMETHAZINE HCL 25 MG/ML IJ SOLN
25.0000 mg | Freq: Once | INTRAMUSCULAR | Status: AC
  Administered 2017-08-13: 25 mg via INTRAVENOUS
  Filled 2017-08-13: qty 1

## 2017-08-13 MED ORDER — TRAMADOL HCL 50 MG PO TABS: 50.0000 mg | ORAL_TABLET | Freq: Four times a day (QID) | ORAL | 0 refills | Status: DC | PRN

## 2017-08-13 MED ORDER — HYDROMORPHONE HCL 2 MG/ML IJ SOLN
0.5000 mg | Freq: Once | INTRAMUSCULAR | Status: AC
  Administered 2017-08-13: 0.5 mg via INTRAVENOUS
  Filled 2017-08-13: qty 1

## 2017-08-13 NOTE — ED Provider Notes (Signed)
[facility] EMERGENCY DEPARTMENT Provider Note   CSN: [id number] Arrival date & time: 08/13/17  1309     History   Chief Complaint Chief Complaint  Patient presents with  . Abdominal Pain    HPI Kyle Berry is a 47 y.o. male.  HPI   47 year old male presents today with complaints of left upper quadrant abdominal pain.  Patient has a history of pancreatitis, noting that he has had frequent flares.  Most recently 3 weeks ago he developed left upper quadrant pain, worsening over the last several days.  Unable to tolerate p.o. at home.  He notes this feels identical to previous episodes of pancreatitis.  Reports that he does not drink alcohol anymore, he notes that generally greasy foods and cheese because worsening symptoms but has not had any of these things recently.  She notes an episode of diarrhea yesterday.  Patient notes he is used Ultram in the past which has improved his symptoms.  Patient does note a history of kidney stones but notes this does not feel similar.   Past Medical History:  Diagnosis Date  . Acute pancreatitis 11/23/2015  . Chronic bronchitis ([place])   . Chronic lower back pain   . Congenital single kidney   . GERD (gastroesophageal reflux disease)   . Headache    "once/month" (05/13/2017)  . High cholesterol   . History of kidney stones   . Hypertension   . Migraine    "a couple/year" (05/13/2017)  . Pneumonia ~ 09/2015  . Recurrent acute pancreatitis   . Renal disorder     Patient Active Problem List   Diagnosis Date Noted  . Recurrent pancreatitis 05/13/2017  . GERD (gastroesophageal reflux disease) 05/13/2017  . Congenital single kidney 05/13/2017  . High cholesterol 05/13/2017  . Abnormal urinalysis 05/13/2017  . Nephrolithiasis 05/13/2017  . Acute on chronic pancreatitis ([place]) 01/17/2017  . Recurrent acute pancreatitis   . History of alcohol abuse   . Hypokalemia   . Non-intractable vomiting with nausea   .  Essential hypertension   . Esophageal reflux   . Pancreatitis 11/23/2015    Past Surgical History:  Procedure Laterality Date  . NO PAST SURGERIES          Home Medications    Prior to Admission medications   Medication Sig Start Date End Date Taking? Authorizing Provider  diltiazem (DILACOR XR) 120 MG 24 hr capsule Take 120 mg by mouth daily.   Yes [provider]  famotidine (PEPCID) 20 MG tablet Take 1 tablet (20 mg total) by mouth 2 (two) times daily for 15 days. 05/16/17 08/13/17 Yes Aline August, MD  feeding supplement (BOOST / RESOURCE BREEZE) LIQD Take 1 Container by mouth 3 (three) times daily between meals. 01/20/17  Yes Mikhail, Velta Addison, DO  lipase/protease/amylase (CREON) 36000 UNITS CPEP capsule Take 1 capsule (36,000 Units total) by mouth as directed. Take two with meals and one with snacks Patient taking differently: Take 36,000 Units by mouth See admin instructions. Take one capsules (36,000 units) by mouth with meals (2 meals daily) and one capsule (36000 units) with snacks daily 01/31/17  Yes Willia Craze, NP  ondansetron (ZOFRAN ODT) 4 MG disintegrating tablet Take 1 tablet (4 mg total) by mouth every 8 (eight) hours as needed for nausea or vomiting. Patient not taking: Reported on 08/13/2017 05/12/17   Ward, Ozella Almond, PA-C  promethazine (PHENERGAN) 25 MG tablet Take 1 tablet (25 mg total) by mouth every 6 (six) hours  as needed for nausea or vomiting. 08/13/17   Charlcie Prisco, Dellis Filbert, PA-C  traMADol (ULTRAM) 50 MG tablet Take 1 tablet (50 mg total) by mouth every 6 (six) hours as needed. 08/13/17   Okey Regal, PA-C    Family History Family History  Problem Relation Age of Onset  . Hypertension Other   . Hypertension Mother   . Hypertension Father   . Kidney disease Father   . Hypertension Sister   . Diabetes Sister   . Hypertension Brother     Social History Social History   Tobacco Use  . Smoking status: Never Smoker  . Smokeless tobacco:  Never Used  Substance Use Topics  . Alcohol use: Yes    Comment: 05/13/2017 "might have beer a few times/year"  . Drug use: No     Allergies   Diclofenac; Norvasc [amlodipine besylate]; Omeprazole; and Prednisone   Review of Systems Review of Systems  All other systems reviewed and are negative.    Physical Exam Updated Vital Signs BP (!) 142/97 (BP Location: Right Arm)   Pulse 80   Temp 98.4 F (36.9 C) (Oral)   Resp 14   SpO2 100%   Physical Exam  Constitutional: He is oriented to person, place, and time. He appears well-developed and well-nourished.  HENT:  Head: Normocephalic and atraumatic.  Eyes: Pupils are equal, round, and reactive to light. Conjunctivae are normal. Right eye exhibits no discharge. Left eye exhibits no discharge. No scleral icterus.  Neck: Normal range of motion. No JVD present. No tracheal deviation present.  Pulmonary/Chest: Effort normal. No stridor.  Abdominal:  Tenderness palpation of left upper and mid abdomen-soft nontender no rebound or guarding  Neurological: He is alert and oriented to person, place, and time. Coordination normal.  Psychiatric: He has a normal mood and affect. His behavior is normal. Judgment and thought content normal.  Nursing note and vitals reviewed.    ED Treatments / Results  Labs (all labs ordered are listed, but only abnormal results are displayed) Labs Reviewed  COMPREHENSIVE METABOLIC PANEL - Abnormal; Notable for the following components:      Result Value   Sodium 134 (*)    Calcium 8.6 (*)    All other components within normal limits  CBC - Abnormal; Notable for the following components:   RBC 3.80 (*)    Hemoglobin 12.4 (*)    HCT 36.2 (*)    All other components within normal limits  URINALYSIS, ROUTINE W REFLEX MICROSCOPIC - Abnormal; Notable for the following components:   Specific Gravity, Urine 1.031 (*)    Hgb urine dipstick MODERATE (*)    Ketones, ur 5 (*)    Protein, ur 30 (*)     Bacteria, UA RARE (*)    All other components within normal limits  LIPASE, BLOOD    EKG None  Radiology No results found.  Procedures Procedures (including critical care time)  Medications Ordered in ED Medications  HYDROmorphone (DILAUDID) injection 0.5 mg (has no administration in time range)  sodium chloride 0.9 % bolus 1,000 mL (1,000 mLs Intravenous New Bag/Given 08/13/17 2101)  HYDROmorphone (DILAUDID) injection 0.5 mg (0.5 mg Intravenous Given 08/13/17 2101)  promethazine (PHENERGAN) injection 25 mg (25 mg Intravenous Given 08/13/17 2101)     Initial Impression / Assessment and Plan / ED Course  I have reviewed the triage vital signs and the nursing notes.  Pertinent labs & imaging results that were available during my care of the patient were reviewed by  me and considered in my medical decision making (see chart for details).    Labs: CBC, MP, lipase, urinalysis  Imaging:  Consults:  Therapeutics: Dilaudid, promethazine, normal saline  Discharge Meds: Ultram, promethazine  Assessment/Plan: 47 year old male presents today with abdominal pain.  Patient reports this feels identical to previous episodes of pancreatitis.  Patient has no signs of significant pancreatitis at this time with a normal lipase, normal white count, afebrile and no tachycardia.  Patient was given pain medicine, fluids and antiemetics.  He reports significant improvement in symptoms, he is tolerating p.o. without difficulty.  I have low suspicion for acute intra-abdominal pathology the require further evaluation or management here including diverticulitis, acute appendicitis, acute pancreatitis.  Patient encouraged to return if symptoms persist or sooner if they worsen.  He verbalized understanding and agreement to today's plan had no further questions or concerns at the time of discharge.   Final Clinical Impressions(s) / ED Diagnoses   Final diagnoses:  Left upper quadrant pain    ED  Discharge Orders        Ordered    traMADol (ULTRAM) 50 MG tablet  Every 6 hours PRN     08/13/17 2157    promethazine (PHENERGAN) 25 MG tablet  Every 6 hours PRN     08/13/17 2157       Okey Regal, PA-C 08/13/17 2159    Deno Etienne, DO 08/14/17 1600

## 2017-08-13 NOTE — ED Triage Notes (Signed)
Pt presents for evaluation of LUQ abd pain since yesterday, hx of pancreatitis. States he vomited this afternoon.

## 2017-08-13 NOTE — Discharge Instructions (Signed)
Please read attached information. If you experience any new or worsening signs or symptoms please return to the emergency room for evaluation. Please follow-up with your primary care provider or specialist as discussed. Please use medication prescribed only as directed and discontinue taking if you have any concerning signs or symptoms.   °

## 2017-08-17 ENCOUNTER — Emergency Department (HOSPITAL_COMMUNITY)
Admission: EM | Admit: 2017-08-17 | Discharge: 2017-08-17 | Disposition: A | Discharge status: Home / Self Care | Payer: Managed Care, Other (non HMO) | Attending: Emergency Medicine | Admitting: Emergency Medicine

## 2017-08-17 ENCOUNTER — Other Ambulatory Visit: Payer: Self-pay

## 2017-08-17 ENCOUNTER — Encounter (HOSPITAL_COMMUNITY): Payer: Self-pay | Admitting: Emergency Medicine

## 2017-08-17 DIAGNOSIS — E7800 Pure hypercholesterolemia, unspecified: Secondary | ICD-10-CM | POA: Diagnosis not present

## 2017-08-17 DIAGNOSIS — I10 Essential (primary) hypertension: Secondary | ICD-10-CM | POA: Diagnosis not present

## 2017-08-17 DIAGNOSIS — Z79899 Other long term (current) drug therapy: Secondary | ICD-10-CM | POA: Diagnosis not present

## 2017-08-17 DIAGNOSIS — R1013 Epigastric pain: Secondary | ICD-10-CM | POA: Insufficient documentation

## 2017-08-17 DIAGNOSIS — R1010 Upper abdominal pain, unspecified: Secondary | ICD-10-CM | POA: Diagnosis present

## 2017-08-17 LAB — COMPREHENSIVE METABOLIC PANEL
ALT: 29 U/L (ref 17–63)
ANION GAP: 12 (ref 5–15)
AST: 50 U/L — ABNORMAL HIGH (ref 15–41)
Albumin: 3.6 g/dL (ref 3.5–5.0)
Alkaline Phosphatase: 79 U/L (ref 38–126)
BUN: 9 mg/dL (ref 6–20)
CHLORIDE: 99 mmol/L — AB (ref 101–111)
CO2: 22 mmol/L (ref 22–32)
Calcium: 9.2 mg/dL (ref 8.9–10.3)
Creatinine, Ser: 1.16 mg/dL (ref 0.61–1.24)
GFR calc non Af Amer: >60 mL/min (ref >60)
Glucose, Bld: 104 mg/dL — ABNORMAL HIGH (ref 65–99)
POTASSIUM: 4.5 mmol/L (ref 3.5–5.1)
SODIUM: 133 mmol/L — AB (ref 135–145)
Total Bilirubin: 1.3 mg/dL — ABNORMAL HIGH (ref 0.3–1.2)
Total Protein: 7 g/dL (ref 6.5–8.1)

## 2017-08-17 LAB — URINALYSIS, ROUTINE W REFLEX MICROSCOPIC
Bacteria, UA: NONE SEEN
Bilirubin Urine: NEGATIVE
GLUCOSE, UA: NEGATIVE mg/dL
Ketones, ur: NEGATIVE mg/dL
Leukocytes, UA: NEGATIVE
Nitrite: NEGATIVE
Protein, ur: NEGATIVE mg/dL
Specific Gravity, Urine: 1.014 (ref 1.005–1.030)
pH: 5 (ref 5.0–8.0)

## 2017-08-17 LAB — CBC
HEMATOCRIT: 39.2 % (ref 39.0–52.0)
HEMOGLOBIN: 13.5 g/dL (ref 13.0–17.0)
MCH: 33.2 pg (ref 26.0–34.0)
MCHC: 34.4 g/dL (ref 30.0–36.0)
MCV: 96.3 fL (ref 78.0–100.0)
Platelets: 240 10*3/uL (ref 150–400)
RBC: 4.07 MIL/uL — AB (ref 4.22–5.81)
RDW: 14.6 % (ref 11.5–15.5)
WBC: 7 10*3/uL (ref 4.0–10.5)

## 2017-08-17 LAB — LIPASE, BLOOD: LIPASE: 26 U/L (ref 11–51)

## 2017-08-17 MED ORDER — PROMETHAZINE HCL 25 MG PO TABS: 25.0000 mg | ORAL_TABLET | Freq: Four times a day (QID) | ORAL | 0 refills | Status: DC | PRN

## 2017-08-17 MED ORDER — TRAMADOL HCL 50 MG PO TABS: 50.0000 mg | ORAL_TABLET | Freq: Four times a day (QID) | ORAL | 0 refills | Status: DC | PRN

## 2017-08-17 NOTE — ED Notes (Signed)
Got patient into a gown on the monitor patient is resting with call bell in reach 

## 2017-08-17 NOTE — ED Notes (Signed)
ED Provider at bedside. 

## 2017-08-17 NOTE — ED Triage Notes (Signed)
Pt. Stated, its my pancreatitis, I was here on Tuesday and my Lipase was not up but its gotten worse.

## 2017-08-17 NOTE — ED Provider Notes (Addendum)
Kyle Berry Provider Note   CSN: [id number] Arrival date & time: 08/17/17  1145     History   Chief Complaint Chief Complaint  Patient presents with  . Abdominal Pain  . Pancreatitis    HPI Kyle Berry is a 47 y.o. male.  HPI  Patient presents for evaluation of upper abdominal pain present for several days, unrelenting since recent ED evaluation for same.  He was treated with Phenergan tramadol, 5 days ago.  He states this feels like his recurrent episodes of pancreatitis.  He called the GI office and has an appointment scheduled for follow-up in 5 days.  Patient has acute pancreatitis, recurrent, usually with elevated lipase.Marland Kitchen He has had recent advanced imaging, with concern for pancreatic divisum; however that has not been completely elucidated.  Patient is an ex-drinker.  He denies fever, chills, diarrhea, cough or chest pain.  He has been having nausea and vomiting.  There are no other known modifying factors.     Past Medical History:  Diagnosis Date  . Acute pancreatitis 11/23/2015  . Chronic bronchitis ([place])   . Chronic lower back pain   . Congenital single kidney   . GERD (gastroesophageal reflux disease)   . Headache    "once/month" (05/13/2017)  . High cholesterol   . History of kidney stones   . Hypertension   . Migraine    "a couple/year" (05/13/2017)  . Pneumonia ~ 09/2015  . Recurrent acute pancreatitis   . Renal disorder     Patient Active Problem List   Diagnosis Date Noted  . Recurrent pancreatitis 05/13/2017  . GERD (gastroesophageal reflux disease) 05/13/2017  . Congenital single kidney 05/13/2017  . High cholesterol 05/13/2017  . Abnormal urinalysis 05/13/2017  . Nephrolithiasis 05/13/2017  . Acute on chronic pancreatitis ([place]) 01/17/2017  . Recurrent acute pancreatitis   . History of alcohol abuse   . Hypokalemia   . Non-intractable vomiting with nausea   . Essential hypertension   . Esophageal  reflux   . Pancreatitis 11/23/2015    Past Surgical History:  Procedure Laterality Date  . NO PAST SURGERIES          Home Medications    Prior to Admission medications   Medication Sig Start Date End Date Taking? Authorizing Provider  diltiazem (DILACOR XR) 120 MG 24 hr capsule Take 120 mg by mouth daily.   Yes [provider]  feeding supplement (BOOST / RESOURCE BREEZE) LIQD Take 1 Container by mouth 3 (three) times daily between meals. 01/20/17  Yes Mikhail, Velta Addison, DO  lipase/protease/amylase (CREON) 36000 UNITS CPEP capsule Take 1 capsule (36,000 Units total) by mouth as directed. Take two with meals and one with snacks Patient taking differently: Take 36,000 Units by mouth See admin instructions. Take one capsules (36,000 units) by mouth with meals (2 meals daily) and one capsule (36000 units) with snacks daily 01/31/17  Yes Willia Craze, NP  tetrahydrozoline 0.05 % ophthalmic solution Place 1 drop into both eyes daily as needed (dry eyes).   Yes [provider]  traMADol (ULTRAM) 50 MG tablet Take 1 tablet (50 mg total) by mouth every 6 (six) hours as needed. 08/13/17  Yes Hedges, Dellis Filbert, PA-C  famotidine (PEPCID) 20 MG tablet Take 1 tablet (20 mg total) by mouth 2 (two) times daily for 15 days. Patient not taking: Reported on 08/17/2017 05/16/17 08/13/17  Aline August, MD  ondansetron (ZOFRAN ODT) 4 MG disintegrating tablet Take 1 tablet (4 mg  total) by mouth every 8 (eight) hours as needed for nausea or vomiting. Patient not taking: Reported on 08/13/2017 05/12/17   Ward, Ozella Almond, PA-C  promethazine (PHENERGAN) 25 MG tablet Take 1 tablet (25 mg total) by mouth every 6 (six) hours as needed for nausea or vomiting. 08/17/17   Daleen Bo, MD  traMADol (ULTRAM) 50 MG tablet Take 1 tablet (50 mg total) by mouth every 6 (six) hours as needed. 08/17/17   Daleen Bo, MD    Family History Family History  Problem Relation Age of Onset  . Hypertension  Other   . Hypertension Mother   . Hypertension Father   . Kidney disease Father   . Hypertension Sister   . Diabetes Sister   . Hypertension Brother     Social History Social History   Tobacco Use  . Smoking status: Never Smoker  . Smokeless tobacco: Never Used  Substance Use Topics  . Alcohol use: Yes    Comment: 05/13/2017 "might have beer a few times/year"  . Drug use: No     Allergies   Diclofenac; Norvasc [amlodipine besylate]; Omeprazole; and Prednisone   Review of Systems Review of Systems  All other systems reviewed and are negative.    Physical Exam Updated Vital Signs BP (!) 146/98   Pulse 73   Temp 98.9 F (37.2 C) (Oral)   Resp 16   Ht 6\' 1"  (1.854 m)   Wt 80.7 kg (178 lb)   SpO2 97%   BMI 23.48 kg/m   Physical Exam  Constitutional: He is oriented to person, place, and time. He appears well-developed and well-nourished. He appears ill (He appears worried).  HENT:  Head: Normocephalic and atraumatic.  Right Ear: External ear normal.  Left Ear: External ear normal.  Eyes: Pupils are equal, round, and reactive to light. Conjunctivae and EOM are normal.  Neck: Normal range of motion and phonation normal. Neck supple.  Cardiovascular: Normal rate, regular rhythm and normal heart sounds.  Pulmonary/Chest: Effort normal and breath sounds normal. He exhibits no bony tenderness.  Abdominal: Soft. There is no hepatosplenomegaly. There is tenderness (Moderate) in the epigastric area. There is no rigidity and no guarding.  Genitourinary:  Genitourinary Comments: No costovertebral angle tenderness.  Musculoskeletal: Normal range of motion.  Neurological: He is alert and oriented to person, place, and time. No cranial nerve deficit or sensory deficit. He exhibits normal muscle tone. Coordination normal.  Skin: Skin is warm, dry and intact.  Psychiatric: He has a normal mood and affect. His behavior is normal. Judgment and thought content normal.  Nursing  note and vitals reviewed.    ED Treatments / Results  Labs (all labs ordered are listed, but only abnormal results are displayed) Labs Reviewed  COMPREHENSIVE METABOLIC PANEL - Abnormal; Notable for the following components:      Result Value   Sodium 133 (*)    Chloride 99 (*)    Glucose, Bld 104 (*)    AST 50 (*)    Total Bilirubin 1.3 (*)    All other components within normal limits  CBC - Abnormal; Notable for the following components:   RBC 4.07 (*)    All other components within normal limits  URINALYSIS, ROUTINE W REFLEX MICROSCOPIC - Abnormal; Notable for the following components:   Hgb urine dipstick SMALL (*)    All other components within normal limits  LIPASE, BLOOD    EKG None  Radiology No results found.  Procedures Procedures (including critical care  time)  Medications Ordered in ED Medications - No data to display   Initial Impression / Assessment and Plan / ED Course  I have reviewed the triage vital signs and the nursing notes.  Pertinent labs & imaging results that were available during my care of the patient were reviewed by me and considered in my medical decision making (see chart for details).  Clinical Course as of Aug 18 1518  Sat Aug 17, 2017  1442 Normal  Urinalysis, Routine w reflex microscopic(!) [EW]  1442 Normal  Lipase, blood [EW]  1442 Normal  Comprehensive metabolic panel(!) [EW]  4401 Normal  CBC(!) [EW]  1443 Mild elevation  BP(!): 146/109 [EW]    Clinical Course User Index [EW] Daleen Bo, MD     Patient Vitals for the past 24 hrs:  BP Temp Temp src Pulse Resp SpO2 Height Weight  08/17/17 1515 (!) 146/98 - - 73 - 97 % - -  08/17/17 1507 (!) 139/98 - - - - - - -  08/17/17 1400 (!) 154/97 - - 66 - 97 % - -  08/17/17 1315 - - - 75 - 96 % - -  08/17/17 1230 - - - 80 - 97 % - -  08/17/17 1202 (!) 146/109 98.9 F (37.2 C) Oral 88 16 99 % 6\' 1"  (1.854 m) 80.7 kg (178 lb)   [employer] narcotic database  reviewed.  Single 5-day prescription given this year.  2:38 PM Reevaluation with update and discussion. After initial assessment and treatment, an updated evaluation reveals no change in clinical status.  Findings discussed with patient all questions answered.Daleen Bo   Medical decision making -recurrent abdominal pain, with patient concern for pancreatitis and clinical history consistent with recurrent pancreatitis, however today, there appears to be low risk for acute pancreatitis.  Patient noted to have possible pancreatic divisum which may need further evaluation, and could be contributing to ongoing pain.  Therefore the patient will be covered with additional pain medicine until he can follow-up with his GI doctor in 5 days as planned.  Nursing Notes Reviewed/ Care Coordinated Applicable Imaging Reviewed Interpretation of Laboratory Data incorporated into ED treatment  The patient appears reasonably screened and/or stabilized for discharge and I doubt any other medical condition or other [facility] requiring further screening, evaluation, or treatment in the ED at this time prior to discharge.  Plan: Home Medications-OTC analgesia as needed, stool softener as needed; Home Treatments-gradually advance diet; return here if the recommended treatment, does not improve the symptoms; Recommended follow up-GI follow-up as planned, continue to avoid alcohol.     Final Clinical Impressions(s) / ED Diagnoses   Final diagnoses:  Epigastric pain  Hypertension, unspecified type    ED Discharge Orders        Ordered    traMADol (ULTRAM) 50 MG tablet  Every 6 hours PRN     08/17/17 1454    promethazine (PHENERGAN) 25 MG tablet  Every 6 hours PRN     08/17/17 1454       Daleen Bo, MD 08/17/17 1455        Daleen Bo, MD 08/17/17 1520

## 2017-08-17 NOTE — Discharge Instructions (Signed)
The testing today did not show elevation of the lipase.  You may have a mild case of pancreatitis.  It is important to follow-up with your GI provider as scheduled in 5 days for a checkup.  Ask them about treatment of pancreatic divisum.

## 2017-08-25 ENCOUNTER — Observation Stay (HOSPITAL_COMMUNITY): Payer: Managed Care, Other (non HMO)

## 2017-08-25 ENCOUNTER — Other Ambulatory Visit: Payer: Self-pay

## 2017-08-25 ENCOUNTER — Encounter (HOSPITAL_COMMUNITY): Payer: Self-pay

## 2017-08-25 ENCOUNTER — Inpatient Hospital Stay (HOSPITAL_COMMUNITY)
Admission: EM | Admit: 2017-08-25 | Discharge: 2017-08-29 | DRG: 439 | Disposition: A | Discharge status: Home / Self Care | Payer: Managed Care, Other (non HMO) | Attending: Internal Medicine | Admitting: Internal Medicine

## 2017-08-25 DIAGNOSIS — I10 Essential (primary) hypertension: Secondary | ICD-10-CM | POA: Diagnosis present

## 2017-08-25 DIAGNOSIS — F1099 Alcohol use, unspecified with unspecified alcohol-induced disorder: Secondary | ICD-10-CM | POA: Diagnosis not present

## 2017-08-25 DIAGNOSIS — K861 Other chronic pancreatitis: Secondary | ICD-10-CM | POA: Diagnosis not present

## 2017-08-25 DIAGNOSIS — K219 Gastro-esophageal reflux disease without esophagitis: Secondary | ICD-10-CM | POA: Diagnosis not present

## 2017-08-25 DIAGNOSIS — Z833 Family history of diabetes mellitus: Secondary | ICD-10-CM

## 2017-08-25 DIAGNOSIS — N179 Acute kidney failure, unspecified: Secondary | ICD-10-CM | POA: Diagnosis not present

## 2017-08-25 DIAGNOSIS — K859 Acute pancreatitis without necrosis or infection, unspecified: Principal | ICD-10-CM

## 2017-08-25 DIAGNOSIS — Z79899 Other long term (current) drug therapy: Secondary | ICD-10-CM

## 2017-08-25 DIAGNOSIS — Z841 Family history of disorders of kidney and ureter: Secondary | ICD-10-CM

## 2017-08-25 DIAGNOSIS — E7800 Pure hypercholesterolemia, unspecified: Secondary | ICD-10-CM | POA: Diagnosis present

## 2017-08-25 DIAGNOSIS — G8929 Other chronic pain: Secondary | ICD-10-CM | POA: Diagnosis present

## 2017-08-25 DIAGNOSIS — Z79891 Long term (current) use of opiate analgesic: Secondary | ICD-10-CM

## 2017-08-25 DIAGNOSIS — Z8249 Family history of ischemic heart disease and other diseases of the circulatory system: Secondary | ICD-10-CM | POA: Diagnosis not present

## 2017-08-25 DIAGNOSIS — R109 Unspecified abdominal pain: Secondary | ICD-10-CM

## 2017-08-25 DIAGNOSIS — M545 Low back pain: Secondary | ICD-10-CM | POA: Diagnosis present

## 2017-08-25 DIAGNOSIS — Q600 Renal agenesis, unilateral: Secondary | ICD-10-CM

## 2017-08-25 DIAGNOSIS — E860 Dehydration: Secondary | ICD-10-CM | POA: Diagnosis present

## 2017-08-25 DIAGNOSIS — Z888 Allergy status to other drugs, medicaments and biological substances status: Secondary | ICD-10-CM

## 2017-08-25 DIAGNOSIS — Z87442 Personal history of urinary calculi: Secondary | ICD-10-CM

## 2017-08-25 LAB — CBC
HCT: 41.4 % (ref 39.0–52.0)
Hemoglobin: 14.2 g/dL (ref 13.0–17.0)
MCH: 33.2 pg (ref 26.0–34.0)
MCHC: 34.3 g/dL (ref 30.0–36.0)
MCV: 96.7 fL (ref 78.0–100.0)
Platelets: 234 10*3/uL (ref 150–400)
RBC: 4.28 MIL/uL (ref 4.22–5.81)
RDW: 14.6 % (ref 11.5–15.5)
WBC: 10.2 10*3/uL (ref 4.0–10.5)

## 2017-08-25 LAB — COMPREHENSIVE METABOLIC PANEL
ALBUMIN: 3.7 g/dL (ref 3.5–5.0)
ALK PHOS: 75 U/L (ref 38–126)
ALT: 38 U/L (ref 17–63)
ANION GAP: 11 (ref 5–15)
AST: 53 U/L — AB (ref 15–41)
BILIRUBIN TOTAL: 1.6 mg/dL — AB (ref 0.3–1.2)
BUN: 9 mg/dL (ref 6–20)
CALCIUM: 8.9 mg/dL (ref 8.9–10.3)
CO2: 23 mmol/L (ref 22–32)
Chloride: 103 mmol/L (ref 101–111)
Creatinine, Ser: 1.31 mg/dL — ABNORMAL HIGH (ref 0.61–1.24)
GFR calc Af Amer: >60 mL/min (ref >60)
GLUCOSE: 123 mg/dL — AB (ref 65–99)
Potassium: 4 mmol/L (ref 3.5–5.1)
Sodium: 137 mmol/L (ref 135–145)
TOTAL PROTEIN: 7.1 g/dL (ref 6.5–8.1)

## 2017-08-25 LAB — LIPASE, BLOOD: Lipase: 95 U/L — ABNORMAL HIGH (ref 11–51)

## 2017-08-25 MED ORDER — SODIUM CHLORIDE 0.9 % IV BOLUS
1000.0000 mL | Freq: Once | INTRAVENOUS | Status: AC
  Administered 2017-08-25: 1000 mL via INTRAVENOUS

## 2017-08-25 MED ORDER — DILTIAZEM HCL ER 120 MG PO CP24: 120.0000 mg | ORAL_CAPSULE | Freq: Every day | ORAL | Status: DC

## 2017-08-25 MED ORDER — ENOXAPARIN SODIUM 40 MG/0.4ML ~~LOC~~ SOLN
40.0000 mg | SUBCUTANEOUS | Status: DC
Start: 2017-08-25 — End: 2017-08-29
  Administered 2017-08-25 – 2017-08-28 (×4): 40 mg via SUBCUTANEOUS
  Filled 2017-08-25 (×4): qty 0.4

## 2017-08-25 MED ORDER — PROMETHAZINE HCL 25 MG/ML IJ SOLN
12.5000 mg | Freq: Four times a day (QID) | INTRAMUSCULAR | Status: DC | PRN
  Administered 2017-08-25 – 2017-08-26 (×2): 25 mg via INTRAVENOUS
  Filled 2017-08-25 (×2): qty 1

## 2017-08-25 MED ORDER — SODIUM CHLORIDE 0.9 % IV SOLN
INTRAVENOUS | Status: DC
  Administered 2017-08-25: 16:00:00 via INTRAVENOUS

## 2017-08-25 MED ORDER — HYDROMORPHONE HCL 2 MG/ML IJ SOLN
1.0000 mg | INTRAMUSCULAR | Status: AC | PRN
  Administered 2017-08-25 (×3): 1 mg via INTRAVENOUS
  Filled 2017-08-25 (×2): qty 1

## 2017-08-25 MED ORDER — ONDANSETRON HCL 4 MG/2ML IJ SOLN
4.0000 mg | Freq: Once | INTRAMUSCULAR | Status: AC
  Administered 2017-08-25: 4 mg via INTRAVENOUS
  Filled 2017-08-25: qty 2

## 2017-08-25 MED ORDER — ONDANSETRON HCL 4 MG PO TABS: 4.0000 mg | ORAL_TABLET | Freq: Four times a day (QID) | ORAL | Status: DC | PRN

## 2017-08-25 MED ORDER — ONDANSETRON HCL 4 MG/2ML IJ SOLN
4.0000 mg | Freq: Four times a day (QID) | INTRAMUSCULAR | Status: DC | PRN
  Administered 2017-08-25: 4 mg via INTRAVENOUS
  Filled 2017-08-25: qty 2

## 2017-08-25 MED ORDER — DILTIAZEM HCL ER COATED BEADS 120 MG PO CP24
120.0000 mg | ORAL_CAPSULE | Freq: Every day | ORAL | Status: DC
  Administered 2017-08-26 – 2017-08-29 (×4): 120 mg via ORAL
  Filled 2017-08-25 (×5): qty 1

## 2017-08-25 MED ORDER — SODIUM CHLORIDE 0.9% FLUSH
3.0000 mL | Freq: Two times a day (BID) | INTRAVENOUS | Status: DC
  Administered 2017-08-25 – 2017-08-29 (×3): 3 mL via INTRAVENOUS

## 2017-08-25 MED ORDER — HYDROMORPHONE HCL 2 MG/ML IJ SOLN
1.0000 mg | INTRAMUSCULAR | Status: DC | PRN
  Administered 2017-08-25 – 2017-08-26 (×4): 1 mg via INTRAVENOUS
  Filled 2017-08-25 (×4): qty 1

## 2017-08-25 MED ORDER — HYDROMORPHONE HCL 2 MG/ML IJ SOLN
1.0000 mg | Freq: Once | INTRAMUSCULAR | Status: AC
  Administered 2017-08-25: 1 mg via INTRAVENOUS
  Filled 2017-08-25: qty 1

## 2017-08-25 MED ORDER — SODIUM CHLORIDE 0.9 % IV SOLN
INTRAVENOUS | Status: DC
  Administered 2017-08-25 – 2017-08-28 (×6): via INTRAVENOUS

## 2017-08-25 MED ORDER — POLYETHYLENE GLYCOL 3350 17 G PO PACK
17.0000 g | PACK | Freq: Every day | ORAL | Status: DC | PRN
  Administered 2017-08-28: 17 g via ORAL
  Filled 2017-08-25: qty 1

## 2017-08-25 NOTE — H&P (Signed)
Date: 08/25/2017               Patient Name:  Kyle Berry MRN: [id number]  DOB: Apr 02, 1971 Age / Sex: 47 y.o., male   PCP: Kerin Perna, NP         Medical Service: Internal Medicine Teaching Service         Attending Physician: Dr. Bartholomew Crews, MD    First Contact: Dr. Thomasene Ripple Pager: [phone]  Second Contact: Dr. Lorella Nimrod Pager: [phone]       After Hours (After 5p/  First Contact Pager: [phone]  weekends / holidays): Second Contact Pager: [phone]   Chief Complaint: Abdominal pain, nausea, and vomiting  History of Present Illness: Kyle Berry is a 47 yo with PMH of HTN, GERD, and recurrent pancreatitis who presents for evaluation of 3-4 week history of abdominal pain, nausea, and vomiting. The history was obtained directly from the patient. Patient notes that about 3 or 4 weeks ago he began to experience severe, sharp pains in his epigastric region. Sometimes these pains radiated from the center of his chest towards his back. The pain has been increasing in intensity and frequency leading up to admission. Patient usually notices the pain whenever he attempts to eat something. The patient notes that he drank a glass of wine when he was out of town last week and that this action really made his pain worse. It is usually exacerbated by moving around. It is relieved with rest and PRN tramadol. Patient notices in the few days leading up to admission that abdominal pain is no longer improved with PRN medications. The patient has become so severe that it is accompanied by shortness of breath. In the few days leading up to admission the pain was associated with decreased appetite, nausea, and vomiting. Patient states for the past 24-48 hours leading up to admission he was not able to keep even his liquid diet of water and chicken broth down. He noticed about 7 episodes of non-bloody emesis in the past 24 hours. Patient has experienced normal, brown bowel  movements since this abdominal pain started (last one the AM of admission). Patient also experiencing subjective chills, unsure of fevers. No headaches, focal weakness, lower extremity swelling, or changes in urination.   Patient states that he has experienced this constellation of symptoms multiple times in the past (possibly 4-5 episodes in the past 1.5-2 years). He states that he sees a GI doctor for what he has been told is pancreatitis 2/2 alcohol use. Patient used to drink at least one 32 oz beer per day in the past, but stopped drinking on a daily basis about 8 months ago. Now intermittently drinks socially. Patient states that outpatient GI doctor wants him to get additional imaging to determine if there is a congenital abnormality in his pancreas that is causing him to have recurrent pancreatitis.   Upon arrival to the ED patient was afebrile, nontachycardic, intermittently hypertensive (140s/90s), and saturating >92% on room air. CBC with Hgb =14.2 and WBC =10.2. CMP remarkable for Cr 1.31 (baseline 1.0-1.1), AST =53, ALT = 38, Total Bilirubin 1.6, and alk phosphatase of 75. Lipase = 95. Patient received IV dilaudid and 2L NS in the ED. No additional studies were obtained. Patient had ongoing abdominal pain and did not tolerate PO while in ED, so IMTS was called  Meds:  Current Meds  Medication Sig  . Carboxymethylcellulose Sodium (EYE DROPS OP) Apply 2 drops to eye daily.  Marland Kitchen  diltiazem (DILACOR XR) 120 MG 24 hr capsule Take 120 mg by mouth daily.  . famotidine (PEPCID) 20 MG tablet Take 1 tablet (20 mg total) by mouth 2 (two) times daily for 15 days. (Patient taking differently: Take 20 mg by mouth daily as needed for heartburn. )  . feeding supplement (BOOST / RESOURCE BREEZE) LIQD Take 1 Container by mouth 3 (three) times daily between meals.  . Ibuprofen-diphenhydrAMINE HCl 200-25 MG CAPS Take 1 tablet by mouth daily as needed (Pain, Sleep).  . lipase/protease/amylase (CREON) 36000 UNITS  CPEP capsule Take 1 capsule (36,000 Units total) by mouth as directed. Take two with meals and one with snacks (Patient taking differently: Take 36,000 Units by mouth See admin instructions. Take one capsules (36,000 units) by mouth with meals (2 meals daily) and one capsule (36000 units) with snacks daily)  . promethazine (PHENERGAN) 25 MG tablet Take 1 tablet (25 mg total) by mouth every 6 (six) hours as needed for nausea or vomiting.  . traMADol (ULTRAM) 50 MG tablet Take 1 tablet (50 mg total) by mouth every 6 (six) hours as needed.   Allergies: Allergies as of 08/25/2017 - Review Complete 08/25/2017  Allergen Reaction Noted  . Diclofenac Hives 12/14/2013  . Norvasc [amlodipine besylate] Other (See Comments) 08/15/2016  . Omeprazole Other (See Comments) 08/15/2016  . Prednisone Other (See Comments) 12/14/2013   Past Medical History: Past Medical History:  Diagnosis Date  . Acute pancreatitis 11/23/2015  . Chronic bronchitis ([place])   . Chronic lower back pain   . Congenital single kidney   . GERD (gastroesophageal reflux disease)   . Headache    "once/month" (05/13/2017)  . High cholesterol   . History of kidney stones   . Hypertension   . Migraine    "a couple/year" (05/13/2017)  . Pneumonia ~ 09/2015  . Recurrent acute pancreatitis   . Renal disorder    Family History:  Family History  Problem Relation Age of Onset  . Hypertension Other   . Hypertension Mother   . Hypertension Father   . Kidney disease Father   . Hypertension Sister   . Diabetes Sister   . Hypertension Brother    Social History:  Patient lives with girlfriend and works as a Engineer, building services. Patient is never smoker, no illicit drugs. Endorses drinking alcohol daily until about 8 months ago, when he started having abdominal problems, and now currently only drinks socially.  Review of Systems: A complete ROS was negative except as per HPI.   Physical Exam: Blood pressure (!) 144/85, pulse 84,  temperature 98.8 F (37.1 C), temperature source Oral, resp. rate 12, height _0  (1.854 m), weight 178 lb (80.7 kg), SpO2 94 %.  Physical Exam  Constitutional: He appears well-developed and well-nourished. No distress.  HENT:  Dry cracked lips, dry oropharynx without exudate  Eyes: Conjunctivae and EOM are normal. No scleral icterus.  Cardiovascular: Normal rate, regular rhythm and intact distal pulses. Exam reveals no friction rub.  No murmur heard. Respiratory: Effort normal. No respiratory distress. He has no wheezes. He has no rales.  GI: Soft. Bowel sounds are normal. He exhibits no distension and no mass. There is tenderness (epigastric region). There is no rebound and no guarding.  Musculoskeletal: He exhibits no edema (of bilateral lower extremities) or tenderness (of bilateral lower extremities).  Neurological:  Face strength and sensation intact bilaterally. Tongue midline. Gross motor and sensation to light touch of upper and lower extremities intact bilaterally.  Skin: Skin  is warm and dry. No rash noted. He is not diaphoretic. No erythema.   Assessment & Plan by Problem: Active Problems:   Pancreatitis, recurrent  Sy Saintjean is a 47 yo with PMH of HTN, GERD, and recurrent pancreatitis who presents for evaluation of 3-4 week history of epigastric pain, nausea and vomiting. While in ED patient unable to tolerate PO and control abdominal pain. Patient admitted to the internal medicine teaching service for management. The specific problems addressed during admission are as follows:  Pancreatitis, recurrent: Patient presenting with 3-4 weeks of progressively worsening epigastric pain which radiates to his back and is associated with decreased appetite, nausea, non-bloody emesis, and inability to tolerate PO. This constellation of symptoms is suggestive of pancreatitis, which the patient has experienced in the past. 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 Patient likely has atrophy at baseline, as he requires Creon supplementation at baseline. At first, patient states alcohol trigger of symptoms, however patient reports ongoing symptoms in spite of alcohol cessation (although patient did have one alcoholic drink during acute episode which worsened symptoms). Patient's previous MRI in 10/2016 suggested pancreatic divisium was a possibility and patient already being worked up for this in outpatient setting. Patient has elevated T. Bili on CMP but no other signs of gallstones/obstruction on PE or blood work. Will obtain abdominal ultrasound to better evaluate for biliary dilation, acute pancreatic changes, and/or gallstones as the cause of pancreatitis as the patient did not have any imaging on admission. Will continue supportive care for symptoms with IV Dilaudid and keep patient NPO this evening with aggressive fluid resuscitation overnight. Will advance to clear liquid diet once patient has improved appetite and imaging studies complete. -Admit to Med-Surg -NS @ rate 200 ml/hr -IV Dilaudid 1 mg q4 hours PRN, IV Zofran 12m q6 hours PRN -NPO for now, will advance to clears with clinical improvement -Abdominal ultrasound to evaluate for pancreatitis, gallstones, biliary dilation -Repeat CMP in AM  AKI: Cr on admission 1.31, with baseline of 1.0-1.1 per chart review. Given ongoing nausea, vomiting, this is most likely prerenal in origin and will recheck labs after fluid resuscitation. Patient only has one kidney, so will have low threshold for examination of post obstructive causes if Cr does not improve overnight.  -Cr in AM  HTN: Patient intermittently hypertensive in ED. Per chart review patient on diltiazem 120 mg daily for BP management as outpatient. Patient would likely benefit from ACE/ARB for renal protection given that he  has one functioning kidney, but will not start this during hospitalization given AKI. Will recommend this on discharge, especially if BP not well controlled on home regimen.  -Home diltiazem 120 mg daily -Continue to monitor while inpatient  FEN/GI: -NPO (resume Creon 36,000 at least BID with meals, snacks) -NS @ rate 200 ml/hr, replace electrolytes as needed  VTE Prophylaxis: Lovenox daily Code Status: Full  Dispo: Admit patient to Inpatient with expected length of stay greater than 2 midnights.  Signed:Thomasene Ripple MD 08/25/2017, 4:28 PM  Pager: [phone]

## 2017-08-25 NOTE — ED Provider Notes (Signed)
[facility] EMERGENCY DEPARTMENT Provider Note   CSN: [id number] Arrival date & time: 08/25/17  1011     History   Chief Complaint Chief Complaint  Patient presents with  . Abdominal Pain    HPI Kyle Berry is a 47 y.o. male.  HPI Patient presents emergency room for evaluation of persistent upper abdominal pain.  Symptoms started over a week ago.  Patient has a history of pancreatitis and felt that his symptoms were similar to previous pancreatitis episodes.  Patient states he does not drink as heavily as he used to but he does still occasionally drink alcohol.  Patient has persistent nausea and vomiting.  He is not able to keep anything down.  He denies any diarrhea or fever.  No chest pain or shortness of breath. Past Medical History:  Diagnosis Date  . Acute pancreatitis 11/23/2015  . Chronic bronchitis ([place])   . Chronic lower back pain   . Congenital single kidney   . GERD (gastroesophageal reflux disease)   . Headache    "once/month" (05/13/2017)  . High cholesterol   . History of kidney stones   . Hypertension   . Migraine    "a couple/year" (05/13/2017)  . Pneumonia ~ 09/2015  . Recurrent acute pancreatitis   . Renal disorder     Patient Active Problem List   Diagnosis Date Noted  . Recurrent pancreatitis 05/13/2017  . GERD (gastroesophageal reflux disease) 05/13/2017  . Congenital single kidney 05/13/2017  . High cholesterol 05/13/2017  . Abnormal urinalysis 05/13/2017  . Nephrolithiasis 05/13/2017  . Acute on chronic pancreatitis ([place]) 01/17/2017  . Recurrent acute pancreatitis   . History of alcohol abuse   . Hypokalemia   . Non-intractable vomiting with nausea   . Essential hypertension   . Esophageal reflux   . Pancreatitis 11/23/2015    Past Surgical History:  Procedure Laterality Date  . NO PAST SURGERIES          Home Medications    Prior to Admission medications   Medication Sig Start Date End Date Taking?  Authorizing Provider  Carboxymethylcellulose Sodium (EYE DROPS OP) Apply 2 drops to eye daily.   Yes [provider]  diltiazem (DILACOR XR) 120 MG 24 hr capsule Take 120 mg by mouth daily.   Yes [provider]  famotidine (PEPCID) 20 MG tablet Take 1 tablet (20 mg total) by mouth 2 (two) times daily for 15 days. Patient taking differently: Take 20 mg by mouth daily as needed for heartburn.  05/16/17 08/25/17 Yes Aline August, MD  feeding supplement (BOOST / RESOURCE BREEZE) LIQD Take 1 Container by mouth 3 (three) times daily between meals. 01/20/17  Yes Mikhail, Maryann, DO  Ibuprofen-diphenhydrAMINE HCl 200-25 MG CAPS Take 1 tablet by mouth daily as needed (Pain, Sleep).   Yes [provider]  lipase/protease/amylase (CREON) 36000 UNITS CPEP capsule Take 1 capsule (36,000 Units total) by mouth as directed. Take two with meals and one with snacks Patient taking differently: Take 36,000 Units by mouth See admin instructions. Take one capsules (36,000 units) by mouth with meals (2 meals daily) and one capsule (36000 units) with snacks daily 01/31/17  Yes Willia Craze, NP  promethazine (PHENERGAN) 25 MG tablet Take 1 tablet (25 mg total) by mouth every 6 (six) hours as needed for nausea or vomiting. 08/17/17  Yes Daleen Bo, MD  traMADol (ULTRAM) 50 MG tablet Take 1 tablet (50 mg total) by mouth every 6 (six) hours as needed.  08/13/17  Yes Hedges, Dellis Filbert, PA-C  ondansetron (ZOFRAN ODT) 4 MG disintegrating tablet Take 1 tablet (4 mg total) by mouth every 8 (eight) hours as needed for nausea or vomiting. Patient not taking: Reported on 08/13/2017 05/12/17   Ward, Ozella Almond, PA-C  traMADol (ULTRAM) 50 MG tablet Take 1 tablet (50 mg total) by mouth every 6 (six) hours as needed. Patient not taking: Reported on 08/25/2017 08/17/17   Daleen Bo, MD    Family History Family History  Problem Relation Age of Onset  . Hypertension Other   . Hypertension Mother   .  Hypertension Father   . Kidney disease Father   . Hypertension Sister   . Diabetes Sister   . Hypertension Brother     Social History Social History   Tobacco Use  . Smoking status: Never Smoker  . Smokeless tobacco: Never Used  Substance Use Topics  . Alcohol use: Yes    Comment: 05/13/2017 "might have beer a few times/year"  . Drug use: No     Allergies   Diclofenac; Norvasc [amlodipine besylate]; Omeprazole; and Prednisone   Review of Systems Review of Systems  All other systems reviewed and are negative.    Physical Exam Updated Vital Signs BP (!) 142/85   Pulse 82   Temp 98.8 F (37.1 C) (Oral)   Resp 12   Ht 1.854 m (6\' 1" )   Wt 80.7 kg (178 lb)   SpO2 95%   BMI 23.48 kg/m   Physical Exam  Constitutional: He appears well-developed and well-nourished. No distress.  HENT:  Head: Normocephalic and atraumatic.  Right Ear: External ear normal.  Left Ear: External ear normal.  Eyes: Conjunctivae are normal. Right eye exhibits no discharge. Left eye exhibits no discharge. No scleral icterus.  Neck: Neck supple. No tracheal deviation present.  Cardiovascular: Normal rate, regular rhythm and intact distal pulses.  Pulmonary/Chest: Effort normal and breath sounds normal. No stridor. No respiratory distress. He has no wheezes. He has no rales.  Abdominal: Soft. Bowel sounds are normal. He exhibits no distension. There is tenderness in the epigastric area. There is guarding. There is no rigidity and no rebound. No hernia.  Musculoskeletal: He exhibits no edema or tenderness.  Neurological: He is alert. He has normal strength. No cranial nerve deficit (no facial droop, extraocular movements intact, no slurred speech) or sensory deficit. He exhibits normal muscle tone. He displays no seizure activity. Coordination normal.  Skin: Skin is warm and dry. No rash noted.  Psychiatric: He has a normal mood and affect.  Nursing note and vitals reviewed.    ED Treatments /  Results  Labs (all labs ordered are listed, but only abnormal results are displayed) Labs Reviewed  LIPASE, BLOOD - Abnormal; Notable for the following components:      Result Value   Lipase 95 (*)    All other components within normal limits  COMPREHENSIVE METABOLIC PANEL - Abnormal; Notable for the following components:   Glucose, Bld 123 (*)    Creatinine, Ser 1.31 (*)    AST 53 (*)    Total Bilirubin 1.6 (*)    All other components within normal limits  CBC    EKG None  Radiology No results found.  Procedures Procedures (including critical care time)  Medications Ordered in ED Medications  HYDROmorphone (DILAUDID) injection 1 mg (1 mg Intravenous Given 08/25/17 1315)  sodium chloride 0.9 % bolus 1,000 mL (1,000 mLs Intravenous New Bag/Given 08/25/17 1238)    And  sodium chloride 0.9 % bolus 1,000 mL (has no administration in time range)    And  0.9 %  sodium chloride infusion (has no administration in time range)  ondansetron (ZOFRAN) injection 4 mg (4 mg Intravenous Given 08/25/17 1237)     Initial Impression / Assessment and Plan / ED Course  I have reviewed the triage vital signs and the nursing notes.  Pertinent labs & imaging results that were available during my care of the patient were reviewed by me and considered in my medical decision making (see chart for details).  Clinical Course as of Aug 25 1356  Sun Aug 25, 2017  1355 Pain is somewhat better but not resolved and is starting to become more severe again.  Pt feels like he needs to be admitted.   [JK]    Clinical Course User Index [JK] Dorie Rank, MD    Patient presented to the emergency room with persistent abdominal pain.  He has a history of confirmed pancreatitis based on laboratory and CT scan findings.  Patient symptoms are similar to previous pancreatitis bouts.  His lipase is now elevated as opposed to what it was a few days ago.  Patient has been treated with IV fluids and pain medications  however is still having pain and discomfort.  Considering this is his third visit to the emergency room since April 30 I think it is reasonable to bring him into the hospital for further treatment.  Final Clinical Impressions(s) / ED Diagnoses   Final diagnoses:  Acute pancreatitis, unspecified complication status, unspecified pancreatitis type       Dorie Rank, MD 08/25/17 1358

## 2017-08-25 NOTE — ED Triage Notes (Signed)
Patient complains of ongoing epigastric pain for 1 week. Has been seen x 2 for same the past week and no relief. Reports ongoing pain and nausea

## 2017-08-26 ENCOUNTER — Encounter (HOSPITAL_COMMUNITY): Payer: Self-pay | Admitting: General Practice

## 2017-08-26 ENCOUNTER — Other Ambulatory Visit: Payer: Self-pay

## 2017-08-26 DIAGNOSIS — Z8249 Family history of ischemic heart disease and other diseases of the circulatory system: Secondary | ICD-10-CM | POA: Diagnosis not present

## 2017-08-26 DIAGNOSIS — N179 Acute kidney failure, unspecified: Secondary | ICD-10-CM | POA: Diagnosis present

## 2017-08-26 DIAGNOSIS — Z833 Family history of diabetes mellitus: Secondary | ICD-10-CM | POA: Diagnosis not present

## 2017-08-26 DIAGNOSIS — E860 Dehydration: Secondary | ICD-10-CM | POA: Diagnosis present

## 2017-08-26 DIAGNOSIS — I10 Essential (primary) hypertension: Secondary | ICD-10-CM | POA: Diagnosis present

## 2017-08-26 DIAGNOSIS — R109 Unspecified abdominal pain: Secondary | ICD-10-CM | POA: Diagnosis present

## 2017-08-26 DIAGNOSIS — Z841 Family history of disorders of kidney and ureter: Secondary | ICD-10-CM | POA: Diagnosis not present

## 2017-08-26 DIAGNOSIS — K219 Gastro-esophageal reflux disease without esophagitis: Secondary | ICD-10-CM | POA: Diagnosis present

## 2017-08-26 DIAGNOSIS — Z87442 Personal history of urinary calculi: Secondary | ICD-10-CM | POA: Diagnosis not present

## 2017-08-26 DIAGNOSIS — E7800 Pure hypercholesterolemia, unspecified: Secondary | ICD-10-CM | POA: Diagnosis present

## 2017-08-26 DIAGNOSIS — K859 Acute pancreatitis without necrosis or infection, unspecified: Secondary | ICD-10-CM | POA: Diagnosis present

## 2017-08-26 DIAGNOSIS — Z79891 Long term (current) use of opiate analgesic: Secondary | ICD-10-CM | POA: Diagnosis not present

## 2017-08-26 DIAGNOSIS — Z79899 Other long term (current) drug therapy: Secondary | ICD-10-CM | POA: Diagnosis not present

## 2017-08-26 DIAGNOSIS — K861 Other chronic pancreatitis: Secondary | ICD-10-CM | POA: Diagnosis present

## 2017-08-26 DIAGNOSIS — M545 Low back pain: Secondary | ICD-10-CM | POA: Diagnosis present

## 2017-08-26 DIAGNOSIS — Q600 Renal agenesis, unilateral: Secondary | ICD-10-CM | POA: Diagnosis not present

## 2017-08-26 DIAGNOSIS — G8929 Other chronic pain: Secondary | ICD-10-CM | POA: Diagnosis present

## 2017-08-26 LAB — COMPREHENSIVE METABOLIC PANEL
ALK PHOS: 62 U/L (ref 38–126)
ALT: 29 U/L (ref 17–63)
ANION GAP: 11 (ref 5–15)
AST: 29 U/L (ref 15–41)
Albumin: 3.2 g/dL — ABNORMAL LOW (ref 3.5–5.0)
BUN: <5 mg/dL — ABNORMAL LOW (ref 6–20)
CO2: 25 mmol/L (ref 22–32)
Calcium: 8.2 mg/dL — ABNORMAL LOW (ref 8.9–10.3)
Chloride: 101 mmol/L (ref 101–111)
Creatinine, Ser: 1.07 mg/dL (ref 0.61–1.24)
GFR calc non Af Amer: >60 mL/min (ref >60)
Glucose, Bld: 118 mg/dL — ABNORMAL HIGH (ref 65–99)
Potassium: 3.3 mmol/L — ABNORMAL LOW (ref 3.5–5.1)
Sodium: 137 mmol/L (ref 135–145)
Total Bilirubin: 1.2 mg/dL (ref 0.3–1.2)
Total Protein: 6.4 g/dL — ABNORMAL LOW (ref 6.5–8.1)

## 2017-08-26 LAB — MAGNESIUM: Magnesium: 1.7 mg/dL (ref 1.7–2.4)

## 2017-08-26 MED ORDER — PROMETHAZINE HCL 25 MG/ML IJ SOLN
12.5000 mg | INTRAMUSCULAR | Status: DC | PRN
  Administered 2017-08-26 (×2): 12.5 mg via INTRAVENOUS
  Filled 2017-08-26 (×2): qty 1

## 2017-08-26 MED ORDER — ONDANSETRON HCL 4 MG/2ML IJ SOLN
4.0000 mg | Freq: Once | INTRAMUSCULAR | Status: AC
  Administered 2017-08-26: 4 mg via INTRAVENOUS
  Filled 2017-08-26: qty 2

## 2017-08-26 MED ORDER — HYDROMORPHONE HCL 2 MG/ML IJ SOLN
1.5000 mg | INTRAMUSCULAR | Status: DC | PRN
  Administered 2017-08-26 – 2017-08-27 (×5): 1.5 mg via INTRAVENOUS
  Filled 2017-08-26 (×5): qty 1

## 2017-08-26 MED ORDER — POTASSIUM CHLORIDE 10 MEQ/100ML IV SOLN
10.0000 meq | INTRAVENOUS | Status: AC
  Administered 2017-08-26 (×4): 10 meq via INTRAVENOUS
  Filled 2017-08-26 (×4): qty 100

## 2017-08-26 NOTE — Progress Notes (Signed)
  Date: 08/26/2017  Patient name: Kyle Berry  Medical record number: [id number]  Date of birth: 1970/06/11   I have seen and evaluated [name] and discussed their care with the Residency Team. Mr Houseworth is a 47 yo with recurrent pancreatitis for the past two yrs, initially thought to be 2/2 ETOH but pt has stopped almost all ETOH although consumed a glass of wine over weekend. 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 He also has a possible chronic pancreatitis. A CT scan in 10/2016 showed calcifications in the pancreatic head but wasn't commented on in 01/2017. He was started on Creon during his 08/2016 admission. He has noted loose stools which have improved on the Creon. His GI MD also noted fatty foods caused him bloating and abd pain and N. Since October 2018, he has not been without ABD pain although his tramadol helps to reduce the pain. He came to the ED for the 3rd time since 4/30 for increased abd pain, anorexia, n, and V and admitted for further W/U and pain control.  Vitals:   08/25/17 2135 08/26/17 0529  BP: (!) 158/99 (!) 140/96  Pulse: 78 79  Resp:    Temp: 98.1 F (36.7 C) 98.4 F (36.9 C)  SpO2: 99% 97%  RR 16 Gen : NAD but appears uncomfortable HRRR no MRG LCTAB anteriorly ABD + BS, soft, epigastric tenderness to light palp by Dr Latina Craver exam  K 3.3 Cr 1.31 - 1.07 Alb 3.2 Lipase 95  I personally viewed the EKG and confirmed my reading with the official read. Sinus, nl axis, LVH and repol  Assessment and Plan: I have seen and evaluated the patient as outlined above. I agree with the formulated Assessment and Plan as detailed in the residents' note, with the following changes: Mr Stammer is a 47 yo man with recurrent pancreatitis, initially 2/2 ETOH, and likely chronic pancreatitis. He has had uncontrolled pain for 3 - 4 weeks now. The dx of acute pancreatitis is not uncertain at this time so further imaging is not needed currently but if  does not improve as expected, we will need to obtain a CT scan to R/O complications.   1. Acute on chronic pancreatitis - IVF, pain control, NPO, creon once able to take PO  Bartholomew Crews, MD 5/13/201911:53 AM

## 2017-08-26 NOTE — Progress Notes (Addendum)
   Subjective:  Patient seen sitting comfortably in bed this AM in no acute distress. States ongoing vomiting and epigastric pain not well controlled overnight. Patient continues with decreased appetite, but endorses thirst. Patient states that pain makes him not want to try eating and drinking today.   Objective:  Vital signs in last 24 hours: Vitals:   08/25/17 1500 08/25/17 1700 08/25/17 2135 08/26/17 0529  BP: (!) 144/85 130/82 (!) 158/99 (!) 140/96  Pulse: 84 70 78 79  Resp:  16    Temp:  98.8 F (37.1 C) 98.1 F (36.7 C) 98.4 F (36.9 C)  TempSrc:  Oral Oral Oral  SpO2: 94% 96% 99% 97%  Weight:      Height:       Physical Exam  Constitutional: He appears well-developed and well-nourished. No distress.  HENT:  Dry cracked lips, stable  Eyes: Conjunctivae and EOM are normal. No scleral icterus.  Cardiovascular: Normal rate, regular rhythm and intact distal pulses. Exam reveals no friction rub.  No murmur heard. Respiratory: Effort normal. No respiratory distress. He has no wheezes. He has no rales.  GI: Soft. Bowel sounds are normal. He exhibits no distension. There is tenderness (Epigastric region, stable from previous exams). There is no rebound and no guarding.  Musculoskeletal: He exhibits no edema (of bilateral lower extremities) or tenderness (of bilateral lower extremities).  Skin: Skin is warm and dry. No rash noted. He is not diaphoretic. No erythema.  Psychiatric: He has a normal mood and affect. His behavior is normal. Thought content normal.   Assessment/Plan:  Active Problems:   Pancreatitis, recurrent  Kyle Berry is a 47 yo with PMH of HTN, GERD, and recurrent pancreatitis who presented for evaluation of 3-4 week history of epigastric pain, nausea and vomiting and was found to have clinical signs of acute pancreatitis. The patient was admitted to the internal medicine teaching service for management. The specific problems addressed during admission are  as follows:  Acute on chronic pancreatitis: Patient with ongoing abdominal pain and vomiting in spite of Dilaudid and Zofran. Patient's ultrasound yesterday without signs of biliary obstruction, T bili within normal limtis today. Will increase frequency and dose of Dilaudid and switch to patient's preferred phenergan. QTC on EKG obtained this AM = 440 msec.  -NS @ rate 200 ml/hr -IV Dilaudid 1.5 mg q3 hours PRN, IV phenergan 12.5 mg q4 hours PRN -NPO for now, will advance to clears with clinical improvement  AKI: Cr on admission 1.31. Returned to baseline of 1.0 with IVF.   HTN: Patient hypertensive this AM. Will likely need additional medication or increase in diltiazem  -Home diltiazem 120 mg daily -Continue to monitor while inpatient  FEN/GI: -NPO (resume Creon 36,000 at least BID with meals, snacks) -NS @ rate 200 ml/hr, replace electrolytes as needed  VTE Prophylaxis: Lovenox daily Code Status: Full  Dispo: Anticipated discharge in approximately 2-3 day(s).   Thomasene Ripple, MD 08/26/2017, 11:00 AM Pager: [phone]

## 2017-08-27 LAB — BASIC METABOLIC PANEL
Anion gap: 10 (ref 5–15)
CO2: 25 mmol/L (ref 22–32)
CREATININE: 1.11 mg/dL (ref 0.61–1.24)
Calcium: 8.5 mg/dL — ABNORMAL LOW (ref 8.9–10.3)
Chloride: 104 mmol/L (ref 101–111)
GFR calc Af Amer: >60 mL/min (ref >60)
GFR calc non Af Amer: >60 mL/min (ref >60)
Glucose, Bld: 79 mg/dL (ref 65–99)
Potassium: 3.5 mmol/L (ref 3.5–5.1)
Sodium: 139 mmol/L (ref 135–145)

## 2017-08-27 MED ORDER — PANCRELIPASE (LIP-PROT-AMYL) 12000-38000 UNITS PO CPEP
36000.0000 [IU] | ORAL_CAPSULE | Freq: Three times a day (TID) | ORAL | Status: DC
  Administered 2017-08-28 – 2017-08-29 (×5): 36000 [IU] via ORAL
  Filled 2017-08-27 (×5): qty 3

## 2017-08-27 MED ORDER — HYDROMORPHONE HCL 2 MG/ML IJ SOLN
1.5000 mg | INTRAMUSCULAR | Status: DC | PRN
  Administered 2017-08-27 – 2017-08-28 (×6): 1.5 mg via INTRAVENOUS
  Filled 2017-08-27 (×6): qty 1

## 2017-08-27 MED ORDER — BOOST / RESOURCE BREEZE PO LIQD CUSTOM
1.0000 | Freq: Three times a day (TID) | ORAL | Status: DC
  Administered 2017-08-27 – 2017-08-29 (×4): 1 via ORAL

## 2017-08-27 MED ORDER — PROMETHAZINE HCL 25 MG/ML IJ SOLN
12.5000 mg | Freq: Four times a day (QID) | INTRAMUSCULAR | Status: DC | PRN
  Administered 2017-08-27 – 2017-08-29 (×3): 12.5 mg via INTRAVENOUS
  Filled 2017-08-27 (×3): qty 1

## 2017-08-27 NOTE — Progress Notes (Addendum)
   Subjective:  Patient seen sitting comfortably in bed this AM in no acute distress. States vomiting and epigastric pain improved from yesterday, still not all the way gone. Only one episode of emesis overnight and nausea responding to phenergan PRN. Patient would like to start eating and drinking today.   Objective:  Vital signs in last 24 hours: Vitals:   08/26/17 2100 08/26/17 2104 08/26/17 2208 08/27/17 0456  BP: (!) 131/94 124/90 (!) 131/94 124/82  Pulse: 75 82 75 82  Resp: 16 16 16 16   Temp: 98.8 F (37.1 C) 99.2 F (37.3 C) 98.8 F (37.1 C) 98.7 F (37.1 C)  TempSrc: Oral Oral Oral Oral  SpO2: 97% 96% 97% 98%  Weight:      Height:       Physical Exam  Constitutional: He appears well-developed and well-nourished. No distress.  HENT:  Mouth/Throat: Oropharynx is clear and moist.  Eyes: Conjunctivae and EOM are normal. No scleral icterus.  Cardiovascular: Normal rate, regular rhythm and intact distal pulses. Exam reveals no friction rub.  No murmur heard. Respiratory: Effort normal. No respiratory distress. He has no wheezes. He has no rales.  GI: Soft. Bowel sounds are normal. He exhibits no distension. There is tenderness (Epigastric region, improved from yesterday's exams). There is no rebound and no guarding.  Musculoskeletal: He exhibits no edema (of bilateral lower extremities) or tenderness (of bilateral lower extremities).  Skin: Skin is warm and dry. No rash noted. He is not diaphoretic. No erythema.   Assessment/Plan:  Active Problems:   Pancreatitis, recurrent  Kyle Berry is a 47 yo with PMH of HTN, GERD, and recurrent pancreatitis who presented for evaluation of 3-4 week history of epigastric pain, nausea and vomiting and was found to have clinical signs of acute pancreatitis. The patient was admitted to the internal medicine teaching service for management. The specific problems addressed during admission are as follows:  Acute on chronic  pancreatitis: Patient with ongoing abdominal pain and vomiting, which is slowly improving with PRN medications. Patient feels comfortably  -NS @ rate 150 ml/hr -IV Dilaudid 1.5 mg q4 hours PRN, IV phenergan 12.5 mg q6 hours PRN -CLD, advance as tolerated today -Resume Creon 36,000 BID with meals, snacks once tolerating regular diet  AKI: Cr on admission 1.31. Returned to baseline of 1.0-1.1 with IVF.   HTN: Patient normotensive this AM. Will continue home regimen -Home diltiazem 120 mg daily  FEN/GI: -CLD, advance as tolerated  -NS @ rate 150 ml/hr  VTE Prophylaxis: Lovenox daily Code Status: Full  Dispo: Anticipated discharge in approximately 1-2 day(s).   Thomasene Ripple, MD 08/27/2017, 7:35 AM Pager: [phone]

## 2017-08-27 NOTE — Progress Notes (Signed)
Initial Nutrition Assessment  DOCUMENTATION CODES:   Not applicable   INTERVENTION:    Boost Breeze po TID, each supplement provides 250 kcal and 9 grams of protein  NUTRITION DIAGNOSIS:   Increased nutrient needs related to acute illness as evidenced by estimated needs  GOAL:   Patient will meet greater than or equal to 90% of their needs  MONITOR:   PO intake, Supplement acceptance, Labs, Skin, Weight trends, I & O's  REASON FOR ASSESSMENT:   Consult Assessment of nutrition requirement/status  ASSESSMENT:   47 yo Male with recurrent pancreatitis for the past two yrs, initially thought to be 2/2 ETOH but pt has stopped almost all ETOH although consumed a glass of wine over weekend. Presented to ED with increased abd pain, anorexia, N/V and admitted for further W/U and pain control.  RD met with pt at bedside. Has an emesis bag. Pt reports moderate abdominal pain at time of visit. Pain is worse when he eats. Was able to consume all of his Clear Liquid meal tray at breakfast. Stated "it took me a while".  Pt reveals poor PO intake x 2 weeks PTA. He was able tolerate soup, fruit, juice and crackers. He endorses a 10 lb weight loss during this time frame, however, readings below do not reflect this. Likes Boost Breeze supplements. Had them "last time". Amenable to receiving during this hospitalization.  Medications reviewed and include Creon. Labs reviewed. BUN (< 5). UBW is 192 lbs.  NUTRITION - FOCUSED PHYSICAL EXAM:  Completed. No muscle or fat depletion noticed.  Diet Order:   Diet Order           Diet clear liquid Room service appropriate? Yes; Fluid consistency: Thin  Diet effective now         EDUCATION NEEDS:   No education needs have been identified at this time  Skin:  Skin Assessment: Reviewed RN Assessment  Last BM:  5/12  Height:   Ht Readings from Last 1 Encounters:  08/25/17 6' 1"  (1.854 m)   Weight:   Wt Readings from Last 1  Encounters:  08/25/17 178 lb (80.7 kg)   Wt Readings from Last 15 Encounters:  08/25/17 178 lb (80.7 kg)  08/17/17 178 lb (80.7 kg)  05/31/17 178 lb (80.7 kg)  05/16/17 175 lb 4.3 oz (79.5 kg)  01/31/17 178 lb (80.7 kg)  01/19/17 173 lb 11.2 oz (78.8 kg)  12/21/16 180 lb (81.6 kg)  11/06/16 176 lb 6.4 oz (80 kg)  08/29/16 176 lb 12.8 oz (80.2 kg)  08/15/16 186 lb (84.4 kg)  06/27/16 178 lb 14.4 oz (81.1 kg)  12/13/15 194 lb (88 kg)  12/06/15 194 lb (88 kg)  11/23/15 194 lb 4.8 oz (88.1 kg)  10/01/15 192 lb 5 oz (87.2 kg)   Ideal Body Weight:  83.6 kg  BMI:  Body mass index is 23.48 kg/m.  Estimated Nutritional Needs:   Kcal:  2200-2400  Protein:  110-125 gm  Fluid:  2.2-2.4 L  Arthur Holms, RD, LDN Pager #: [phone] After-Hours Pager #: [phone]

## 2017-08-28 ENCOUNTER — Ambulatory Visit: Payer: Managed Care, Other (non HMO) | Admitting: Nurse Practitioner

## 2017-08-28 MED ORDER — TRAMADOL HCL 50 MG PO TABS
50.0000 mg | ORAL_TABLET | Freq: Four times a day (QID) | ORAL | Status: DC | PRN
  Administered 2017-08-28: 50 mg via ORAL
  Filled 2017-08-28: qty 1

## 2017-08-28 MED ORDER — HYDROMORPHONE HCL 2 MG/ML IJ SOLN
1.5000 mg | INTRAMUSCULAR | Status: DC | PRN
  Administered 2017-08-28: 1.5 mg via INTRAVENOUS
  Filled 2017-08-28: qty 1

## 2017-08-28 MED ORDER — TRAMADOL HCL 50 MG PO TABS
100.0000 mg | ORAL_TABLET | Freq: Four times a day (QID) | ORAL | Status: DC | PRN
  Administered 2017-08-28 – 2017-08-29 (×3): 100 mg via ORAL
  Filled 2017-08-28 (×3): qty 2

## 2017-08-28 NOTE — Progress Notes (Signed)
   Subjective:  Patient seen sitting comfortably in bed this AM in no acute distress. Patient states tolerated CLD without difficulty yesterday, but did require IV pain medications overnight for pain that affected sleep. Patient advanced to semi-solid this AM (grits) but noticed worsening abdominal pain and couldn't finish his meal. Patient interested in trying lunch with PO medication today to see if pain can be managed in this manner as an outpatient.   Objective:  Vital signs in last 24 hours: Vitals:   08/27/17 1431 08/27/17 1431 08/27/17 2100 08/28/17 0435  BP: 125/78 123/76 135/90 122/88  Pulse: 80 87 84 70  Resp: 16 18 18 16   Temp: 98.7 F (37.1 C)  98 F (36.7 C) 98.3 F (36.8 C)  TempSrc: Oral  Oral Oral  SpO2: 100% 94% 97% 93%  Weight:      Height:       Physical Exam  Constitutional: He appears well-developed and well-nourished. No distress.  HENT:  Mouth/Throat: Oropharynx is clear and moist.  Eyes: Conjunctivae and EOM are normal. No scleral icterus.  Cardiovascular: Normal rate, regular rhythm and intact distal pulses. Exam reveals no friction rub.  No murmur heard. Respiratory: Effort normal. No respiratory distress. He has no wheezes. He has no rales.  GI: Soft. Bowel sounds are normal. He exhibits no distension. There is tenderness (Epigastric region, stable from yesterday's). There is no rebound and no guarding.  Musculoskeletal: He exhibits no edema (of bilateral lower extremities) or tenderness (of bilateral lower extremities).  Skin: Skin is warm and dry. No rash noted. He is not diaphoretic. No erythema.   Assessment/Plan:  Active Problems:   Pancreatitis, recurrent  Kyle Berry is a 48 yo with PMH of HTN, GERD, and recurrent pancreatitis who presented for evaluation of 3-4 week history of epigastric pain, nausea and vomiting and was found to have clinical signs of acute pancreatitis. The patient was admitted to the internal medicine teaching service  for management. The specific problems addressed during admission are as follows:  Acute on chronic pancreatitis: Patient with ongoing abdominal pain and vomiting, which is slowly improving with PRN medications. Patient tolerated CLD yesterday without nausea, vomiting. When he advanced to semisolid diet this AM, had resurgence of pain and nausea that was not experienced yesterday. Will plan to try lunch with Tramadol (oral medication) to see if patient can tolerate PO intake on this regimen alone and safely leave hospital.  -IV Dilaudid 1.5 mg q4 hours PRN, IV phenergan 12.5 mg q6 hours PRN -Creon 36,000 BID with meals -Tramadol 50 mg prior to lunch, recheck this afternoon  AKI: Cr on admission 1.31. Currently stable at baseline of 1.0-1.1 with IVF.   HTN: Patient normotensive this AM.  -Home diltiazem 120 mg daily  FEN/GI: -CLD, advance as tolerated  -NS @ rate 150 ml/hr  VTE Prophylaxis: Lovenox daily Code Status: Full  Dispo: Anticipated discharge in approximately 0-1 day(s), pending if patient able to tolerate lunch with PO medications only.  Thomasene Ripple, MD 08/28/2017, 9:15 AM Pager: [phone]

## 2017-08-28 NOTE — Discharge Summary (Addendum)
Name: Kyle Berry MRN: [id number] DOB: 1970-08-06 47 y.o. PCP: Kerin Perna, NP  Date of Admission: 08/25/2017 10:14 AM Date of Discharge: 08/29/2017 Attending Physician: Moise Boring MD PhD  Discharge Diagnosis: Principal Problem:   Acute recurrent pancreatitis Active Problems:   Chronic pancreatitis [facility])   Essential hypertension   Esophageal reflux   Congenital single kidney  Discharge Medications: Allergies as of 08/29/2017      Reactions   Diclofenac Hives   Norvasc [amlodipine Besylate] Other (See Comments)   Fluid buildup in chest   Omeprazole Other (See Comments)   MD stopped due to pancreatitis   Prednisone Other (See Comments)   Mood swings      Medication List    TAKE these medications   diltiazem 120 MG 24 hr capsule Commonly known as:  DILACOR XR Take 120 mg by mouth daily.   EYE DROPS OP Apply 2 drops to eye daily.   famotidine 20 MG tablet Commonly known as:  PEPCID Take 1 tablet (20 mg total) by mouth 2 (two) times daily for 15 days. What changed:    when to take this  reasons to take this   feeding supplement Liqd Take 1 Container by mouth 3 (three) times daily between meals.   Ibuprofen-diphenhydrAMINE HCl 200-25 MG Caps Take 1 tablet by mouth daily as needed (Pain, Sleep).   lipase/protease/amylase 36000 UNITS Cpep capsule Commonly known as:  CREON Take 1 capsule (36,000 Units total) by mouth as directed. Take two with meals and one with snacks What changed:    when to take this  additional instructions   promethazine 25 MG tablet Commonly known as:  PHENERGAN Take 1 tablet (25 mg total) by mouth every 6 (six) hours as needed for nausea or vomiting.   traMADol 50 MG tablet Commonly known as:  ULTRAM Take 2 tablets (100 mg total) by mouth every 6 (six) hours as needed for moderate pain or severe pain (Until hopsital follow up with PCP, resume 50 mg regimen thereafter). What changed:    how much to take  reasons  to take this       Disposition and follow-up:   Kyle Berry was discharged from [facility] in Stable condition.  At the hospital follow up visit please address:  1.  Patient with history of recurrent pancreatitis admitted with radiating epigastric pain, nausea, vomiting, and inability to tolerate PO intake on home Tramadol pain regimen. Patient improved with IV pain medications and was tolerating PO intake on increased dose of home Tramadol regimen (100 mg PRN rather than 50 mg). Please assess PO intake and symptoms on discharge. Would recommend deescalating to usual Tramadol 50 mg at follow up if patient's clinical status continued to improve.   2.  Labs / imaging needed at time of follow-up: None  3.  Pending labs/ test needing follow-up: None  Follow-up Appointments: Follow-up Information    Kerin Perna, NP. Call.   Specialty:  Internal Medicine Why:  Please call your primary care doctor for a visit within 1 week of leaving the hospital. We would like you to be evaluated by this physician to ensure continuing improvement in your symptoms and to discuss this most recent hospitalization.  Contact information: [place] [id number] [place] [facility] Course by problem list: Principal Problem:   Acute recurrent pancreatitis Active Problems:   Chronic pancreatitis [facility])   Essential hypertension  Esophageal reflux   Congenital single kidney   Kyle Berry is a 47 yo with PMH of HTN, GERD, and recurrent pancreatitis who presented for evaluation of3-4 week history of epigastric pain, nausea, vomiting, and inabiltiy to tolerate PO intake during the 48 hours leading up to admission. On initial workup he was found to have clinical signs/symptoms consistent with pancreatitis, elevated lipase (95), and an AKI. The patient was admitted to the internal medicine teaching service for management. The specific  problems addressed during admission are as follows:  Acute on chronic pancreatitis:Patient presented with clinical signs and symptoms of pancreatitis. Per chart and patient report, the patient has experienced multiple episodes of pancreatitis throughout the past two years. Patient reports ongoing abdominal pain on daily basis, with increased frequency leading up to admission, as outpatient as a result of these recurrent episodes. Patient reports history of diarrhea with fatty foods (treated with use of Creon during meals) and previous imaging with pancreatic calcifications and prominence of pancreatic duct, suggesting chronic pancreatitis at baseline as a result of recurrent episodes. Patient's lipase elevated to 95 on admission, which in the setting of chronic pancreatitic inflammation, is consistent with acute flare. Incidentally, patient's total bilirubin mildly elevated to 1.6 on admission. Given this, abdominal ultrasound was obtained to evaluate for biliary obstruction. Abdominal ultrasound did not reveal biliary abnormalities and bilirubin returned within normal limits after IVF administration. The patient's symptoms were treated with IV Dilaudid 1.5 mg q4 hours PRN and phenergan 12.5 mg q6 hours PRN. Patient was able to tolerate clear liquid diet by HD #2 and on HD#3 patient tolerated consistent liquid intake with use of PRN Tramadol 100 mg prior to large meals. Patient discharged with instructions to continue slow advancement of his diet from liquids to solids as an outpatient. He will continue with PRN tramadol use (advised to take 100 mg before large meals) until follow up with PCP. Patient was instructed to follow up with PCP within a few days of discharge so that he can be monitored for ongoing clinical improvement as an outpatient and de-escalation of pain regimen back to baseline.   AKI: Patient's Cr was slightly elevated to 1.31 from baseline of 1.0-1.1 on admission. Patient was clinically  dehydrated and reported a 48 hour history of inability to tolerate PO, consistent with prerenal etiology. The patient's Cr returned to baseline of 1.1 with administration of IVF.   TWS:FKCLEXN remained normotensive on outpatient regimen of Diltiazem 120 mg daily.   Discharge Vitals:   BP (!) 136/91 (BP Location: Right Arm)   Pulse 61   Temp 98.4 F (36.9 C) (Oral)   Resp 18   Ht 6\' 1"  (1.854 m)   Wt 178 lb (80.7 kg)   SpO2 99%   BMI 23.48 kg/m   Pertinent Labs, Studies, and Procedures:   CMP Latest Ref Rng & Units 08/27/2017 08/26/2017 08/25/2017  Glucose 65 - 99 mg/dL 79 118(H) 123(H)  BUN 6 - 20 mg/dL <5(L) <5(L) 9  Creatinine 0.61 - 1.24 mg/dL 1.11 1.07 1.31(H)  Sodium 135 - 145 mmol/L 139 137 137  Potassium 3.5 - 5.1 mmol/L 3.5 3.3(L) 4.0  Chloride 101 - 111 mmol/L 104 101 103  CO2 22 - 32 mmol/L 25 25 23   Calcium 8.9 - 10.3 mg/dL 8.5(L) 8.2(L) 8.9  Total Protein 6.5 - 8.1 g/dL - 6.4(L) 7.1  Total Bilirubin 0.3 - 1.2 mg/dL - 1.2 1.6(H)  Alkaline Phos 38 - 126 U/L - 62 75  AST 15 - 41  U/L - 29 53(H)  ALT 17 - 63 U/L - 29 38   CBC Latest Ref Rng & Units 08/25/2017 08/17/2017 08/13/2017  WBC 4.0 - 10.5 K/uL 10.2 7.0 6.7  Hemoglobin 13.0 - 17.0 g/dL 14.2 13.5 12.4(L)  Hematocrit 39.0 - 52.0 % 41.4 39.2 36.2(L)  Platelets 150 - 400 K/uL 234 240 196   Magnesium  = 1.7 Lipase = 95  Abdominal US, 08/25/2017 IMPRESSION: 1. The pancreas could not be visualized due to abundant bowel gas in the upper abdomen. 2. Chronically atrophied right kidney. 3. Otherwise negative abdomen ultrasound.  Discharge Instructions: Discharge Instructions    Call MD for:  persistant nausea and vomiting   Complete by:  As directed    Call MD for:  severe uncontrolled pain   Complete by:  As directed    Call MD for:  temperature >100.4   Complete by:  As directed    Diet - low sodium heart healthy   Complete by:  As directed    Discharge instructions   Complete by:  As directed    Mr  Jahkai, Yandell were admitted with recurrent, acute pancreatitis. You were treated with IV fluids and pain medications while you were in the hospital. After leaving the hospital, we recommend you take 2 Tramadol tablets (total of 100 mg) as needed for severe pain until follow up with your primary care physician. It is best to take this medication prior to large meals. If you feel like your pain is improving and you no longer need 2 tablets, please go back to taking 1 tablet as needed. Please follow up with your primary care physician within 1 week of leaving the hospital. We want to ensure that the clinical improvement you had in the hospital continues after you leave and that you don't need additional medications for your symptoms.   Increase activity slowly   Complete by:  As directed      Signed: Thomasene Ripple, MD 08/29/2017, 11:41 AM   Pager: [phone]

## 2017-08-29 DIAGNOSIS — Z79891 Long term (current) use of opiate analgesic: Secondary | ICD-10-CM

## 2017-08-29 MED ORDER — TRAMADOL HCL 50 MG PO TABS: 100.0000 mg | ORAL_TABLET | Freq: Four times a day (QID) | ORAL | 0 refills | Status: DC | PRN

## 2017-08-29 NOTE — Discharge Instructions (Signed)
Acute Pancreatitis Acute pancreatitis is a condition in which the pancreas suddenly gets irritated and swollen (has inflammation). The pancreas is a large gland behind the stomach. It makes enzymes that help to digest food. The pancreas also makes hormones that help to control your blood sugar. Acute pancreatitis happens when the enzymes attack the pancreas and damage it. Most attacks last a couple of days and can cause serious problems. Follow these instructions at home: Eating and drinking  Follow instructions from your doctor about diet. You may need to: ? Avoid alcohol. ? Limit how much fat is in your diet.  Eat small meals often. Avoid eating big meals.  Drink enough fluid to keep your pee (urine) clear or pale yellow.  Do not drink alcohol if it caused your condition. General instructions  Take over-the-counter and prescription medicines only as told by your doctor.  Do not use any tobacco products. These include cigarettes, chewing tobacco, and e-cigarettes. If you need help quitting, ask your doctor.  Get plenty of rest.  If directed, check your blood sugar at home as told by your doctor.  Keep all follow-up visits as told by your doctor. This is important. Contact a doctor if:  You do not get better as quickly as expected.  You have new symptoms.  Your symptoms get worse.  You have lasting pain or weakness.  You continue to feel sick to your stomach (nauseous).  You get better and then you have another pain attack.  You have a fever. Get help right away if:  You cannot eat or keep fluids down.  Your pain becomes very bad.  Your skin or the white part of your eyes turns yellow (jaundice).  You throw up (vomit).  You feel dizzy or you pass out (faint).  Your blood sugar is high (over 300 mg/dL). This information is not intended to replace advice given to you by your health care provider. Make sure you discuss any questions you have with your health care  provider. Document Released: 09/19/2007 Document Revised: 09/08/2015 Document Reviewed: 01/04/2015 Elsevier Interactive Patient Education  2018 [employer]  

## 2017-08-29 NOTE — Progress Notes (Signed)
Patient discharged to home with instructions. 

## 2017-08-29 NOTE — Progress Notes (Signed)
   Subjective:  Patient seen laying comfortably in bed this AM. Patient states pain and nausea much improved since yesterday. Able to tolerate dinner without difficulty on increased dose of PRN tramadol. Patient has good appetite for breakfast this AM. NO other complaints.  Objective:  Vital signs in last 24 hours: Vitals:   08/28/17 0435 08/28/17 1332 08/28/17 2117 08/29/17 0525  BP: 122/88 (!) 135/95 (!) 143/97 (!) 136/91  Pulse: 70 74 67 61  Resp: 16 18 18 18   Temp: 98.3 F (36.8 C) 98.6 F (37 C) 98 F (36.7 C) 98.4 F (36.9 C)  TempSrc: Oral Oral  Oral  SpO2: 93% 97% 97% 99%  Weight:      Height:       Physical Exam  Constitutional: He appears well-developed and well-nourished. No distress.  HENT:  Mouth/Throat: Oropharynx is clear and moist.  Eyes: Conjunctivae are normal. No scleral icterus.  Cardiovascular: Normal rate and regular rhythm. Exam reveals no friction rub.  No murmur heard. Respiratory: Effort normal. No respiratory distress. He has no wheezes. He has no rales.  GI: Soft. Bowel sounds are normal. He exhibits no distension. There is no tenderness (Epigastric region, improved from yesterday's exam). There is no rebound and no guarding.  Musculoskeletal: He exhibits no edema (of bilateral lower extremities) or tenderness (of bilateral lower extremities).  Skin: Skin is warm and dry. No rash noted. He is not diaphoretic. No erythema.   Assessment/Plan:  Principal Problem:   Acute recurrent pancreatitis Active Problems:   Chronic pancreatitis ([place])   Essential hypertension   Esophageal reflux   Congenital single kidney  Kyle Berry is a 47 yo with PMH of HTN, GERD, and recurrent pancreatitis who presented for evaluation of 3-4 week history of epigastric pain, nausea and vomiting and was found to have clinical signs of acute pancreatitis. The patient was admitted to the internal medicine teaching service for management. The specific problems addressed  during admission are as follows:  Acute on chronic pancreatitis: Paitient reports interval improvement in symptoms of abdominal pain, nausea, and vomiting. No difficulty tolerating dinner last night with 100 mg Tramadol 1 hour prior to dinner. Patient continues to feel well this AM and hungry for breakfast. Given patient now able to tolerate abdominal pain with PRN oral medications, he is stable for d/c later today. -Creon 36,000 BID with meals -Tramadol 100 mg PRN upon discharge and continue short term until PCP follow up  AKI: Cr on admission 1.31. Currently stable at baseline of 1.0-1.1 with IVF.   HTN: Patient normotensive this AM.  -Home diltiazem 120 mg daily  FEN/GI: -Regular diet   VTE Prophylaxis: Lovenox daily Code Status: Full  Dispo: Anticipated discharge later today.  Kyle Ripple, MD 08/29/2017, 7:12 AM Pager: [phone]

## 2017-10-11 ENCOUNTER — Inpatient Hospital Stay (HOSPITAL_COMMUNITY)
Admission: EM | Admit: 2017-10-11 | Discharge: 2017-10-14 | DRG: 439 | Disposition: A | Discharge status: Home / Self Care | Payer: Managed Care, Other (non HMO) | Attending: Family Medicine | Admitting: Family Medicine

## 2017-10-11 ENCOUNTER — Other Ambulatory Visit: Payer: Self-pay

## 2017-10-11 ENCOUNTER — Encounter (HOSPITAL_COMMUNITY): Payer: Self-pay

## 2017-10-11 ENCOUNTER — Emergency Department (HOSPITAL_COMMUNITY): Payer: Managed Care, Other (non HMO)

## 2017-10-11 DIAGNOSIS — I10 Essential (primary) hypertension: Secondary | ICD-10-CM | POA: Diagnosis present

## 2017-10-11 DIAGNOSIS — E7800 Pure hypercholesterolemia, unspecified: Secondary | ICD-10-CM | POA: Diagnosis present

## 2017-10-11 DIAGNOSIS — Z87898 Personal history of other specified conditions: Secondary | ICD-10-CM

## 2017-10-11 DIAGNOSIS — R079 Chest pain, unspecified: Secondary | ICD-10-CM | POA: Diagnosis present

## 2017-10-11 DIAGNOSIS — K861 Other chronic pancreatitis: Secondary | ICD-10-CM | POA: Diagnosis present

## 2017-10-11 DIAGNOSIS — R1084 Generalized abdominal pain: Secondary | ICD-10-CM | POA: Diagnosis not present

## 2017-10-11 DIAGNOSIS — K859 Acute pancreatitis without necrosis or infection, unspecified: Secondary | ICD-10-CM | POA: Diagnosis not present

## 2017-10-11 DIAGNOSIS — Z79899 Other long term (current) drug therapy: Secondary | ICD-10-CM

## 2017-10-11 DIAGNOSIS — Q600 Renal agenesis, unilateral: Secondary | ICD-10-CM

## 2017-10-11 DIAGNOSIS — F1011 Alcohol abuse, in remission: Secondary | ICD-10-CM

## 2017-10-11 DIAGNOSIS — K219 Gastro-esophageal reflux disease without esophagitis: Secondary | ICD-10-CM | POA: Diagnosis present

## 2017-10-11 LAB — COMPREHENSIVE METABOLIC PANEL
ALK PHOS: 62 U/L (ref 38–126)
ALT: 37 U/L (ref 0–44)
AST: 69 U/L — ABNORMAL HIGH (ref 15–41)
Albumin: 3.9 g/dL (ref 3.5–5.0)
Anion gap: 11 (ref 5–15)
BUN: 9 mg/dL (ref 6–20)
CALCIUM: 9 mg/dL (ref 8.9–10.3)
CO2: 26 mmol/L (ref 22–32)
CREATININE: 1.06 mg/dL (ref 0.61–1.24)
Chloride: 100 mmol/L (ref 98–111)
Glucose, Bld: 121 mg/dL — ABNORMAL HIGH (ref 70–99)
Potassium: 4.4 mmol/L (ref 3.5–5.1)
Sodium: 137 mmol/L (ref 135–145)
Total Bilirubin: 2.5 mg/dL — ABNORMAL HIGH (ref 0.3–1.2)
Total Protein: 7 g/dL (ref 6.5–8.1)

## 2017-10-11 LAB — URINALYSIS, ROUTINE W REFLEX MICROSCOPIC
Bacteria, UA: NONE SEEN
Bilirubin Urine: NEGATIVE
GLUCOSE, UA: NEGATIVE mg/dL
KETONES UR: 20 mg/dL — AB
Leukocytes, UA: NEGATIVE
Nitrite: NEGATIVE
PH: 5 (ref 5.0–8.0)
Protein, ur: 30 mg/dL — AB
SPECIFIC GRAVITY, URINE: 1.029 (ref 1.005–1.030)

## 2017-10-11 LAB — LIPASE, BLOOD: Lipase: 85 U/L — ABNORMAL HIGH (ref 11–51)

## 2017-10-11 LAB — CBC
HCT: 42.8 % (ref 39.0–52.0)
Hemoglobin: 14.4 g/dL (ref 13.0–17.0)
MCH: 33 pg (ref 26.0–34.0)
MCHC: 33.6 g/dL (ref 30.0–36.0)
MCV: 98.2 fL (ref 78.0–100.0)
PLATELETS: 195 10*3/uL (ref 150–400)
RBC: 4.36 MIL/uL (ref 4.22–5.81)
RDW: 13.2 % (ref 11.5–15.5)
WBC: 12.5 10*3/uL — ABNORMAL HIGH (ref 4.0–10.5)

## 2017-10-11 MED ORDER — SODIUM CHLORIDE 0.9 % IV BOLUS
1000.0000 mL | Freq: Once | INTRAVENOUS | Status: AC
  Administered 2017-10-11: 1000 mL via INTRAVENOUS

## 2017-10-11 MED ORDER — TRAMADOL HCL 50 MG PO TABS
50.0000 mg | ORAL_TABLET | Freq: Four times a day (QID) | ORAL | Status: DC | PRN
  Administered 2017-10-12 (×2): 50 mg via ORAL
  Filled 2017-10-11 (×2): qty 1

## 2017-10-11 MED ORDER — ONDANSETRON 4 MG PO TBDP
4.0000 mg | ORAL_TABLET | Freq: Once | ORAL | Status: AC
  Administered 2017-10-11: 4 mg via ORAL
  Filled 2017-10-11: qty 1

## 2017-10-11 MED ORDER — FAMOTIDINE 20 MG PO TABS: 20.0000 mg | ORAL_TABLET | Freq: Every day | ORAL | Status: DC | PRN

## 2017-10-11 MED ORDER — HYDROMORPHONE HCL 1 MG/ML IJ SOLN
1.0000 mg | Freq: Once | INTRAMUSCULAR | Status: AC
  Administered 2017-10-11: 1 mg via INTRAVENOUS
  Filled 2017-10-11: qty 1

## 2017-10-11 MED ORDER — ONDANSETRON 4 MG PO TBDP: 4.0000 mg | ORAL_TABLET | Freq: Three times a day (TID) | ORAL | Status: DC | PRN

## 2017-10-11 MED ORDER — HYDROMORPHONE HCL 1 MG/ML IJ SOLN
1.0000 mg | INTRAMUSCULAR | Status: DC | PRN
  Administered 2017-10-12 – 2017-10-14 (×10): 1 mg via INTRAVENOUS
  Filled 2017-10-11 (×10): qty 1

## 2017-10-11 MED ORDER — DILTIAZEM HCL ER COATED BEADS 120 MG PO CP24
120.0000 mg | ORAL_CAPSULE | Freq: Every day | ORAL | Status: DC
  Administered 2017-10-12 – 2017-10-14 (×3): 120 mg via ORAL
  Filled 2017-10-11 (×3): qty 1

## 2017-10-11 MED ORDER — MORPHINE SULFATE (PF) 4 MG/ML IV SOLN
6.0000 mg | Freq: Once | INTRAVENOUS | Status: AC
  Administered 2017-10-11: 6 mg via INTRAMUSCULAR
  Filled 2017-10-11: qty 2

## 2017-10-11 MED ORDER — PANCRELIPASE (LIP-PROT-AMYL) 36000-114000 UNITS PO CPEP
36000.0000 [IU] | ORAL_CAPSULE | Freq: Three times a day (TID) | ORAL | Status: DC
  Administered 2017-10-12 – 2017-10-14 (×8): 36000 [IU] via ORAL
  Filled 2017-10-11 (×9): qty 1

## 2017-10-11 MED ORDER — TETRAHYDROZOLINE HCL 0.05 % OP SOLN
1.0000 [drp] | Freq: Two times a day (BID) | OPHTHALMIC | Status: DC
  Administered 2017-10-12 – 2017-10-14 (×5): 1 [drp] via OPHTHALMIC
  Filled 2017-10-11: qty 15

## 2017-10-11 MED ORDER — NITROGLYCERIN 0.4 MG SL SUBL: 0.4000 mg | SUBLINGUAL_TABLET | SUBLINGUAL | Status: DC | PRN

## 2017-10-11 NOTE — ED Provider Notes (Signed)
[facility] EMERGENCY DEPARTMENT Provider Note   CSN: [id number] Arrival date & time: 10/11/17  1348     History   Chief Complaint Chief Complaint  Patient presents with  . Abdominal Pain    HPI Kyle Berry is a 47 y.o. male.  HPI Patient is a 47 year old male presents the emergency department with worsening upper abdominal pain today consistent with his prior pancreatitis.  He does drink alcohol heavily.  He no longer drinks alcohol.  His last admission for pancreatitis was at the end of May 2019.  No fevers or chills.  Reports nausea with some vomiting.  Some right-sided flank pain.  Pain is moderate to severe in severity    Past Medical History:  Diagnosis Date  . Acute pancreatitis 11/23/2015  . Chronic bronchitis ([place])   . Chronic lower back pain   . Congenital single kidney   . GERD (gastroesophageal reflux disease)   . Headache    "once/month" (05/13/2017)  . High cholesterol   . History of kidney stones   . Hypertension   . Migraine    "a couple/year" (05/13/2017)  . Pneumonia ~ 09/2015  . Recurrent acute pancreatitis   . Renal disorder     Patient Active Problem List   Diagnosis Date Noted  . Pancreatitis, recurrent 08/25/2017  . Recurrent pancreatitis 05/13/2017  . GERD (gastroesophageal reflux disease) 05/13/2017  . Congenital single kidney 05/13/2017  . High cholesterol 05/13/2017  . Abnormal urinalysis 05/13/2017  . Nephrolithiasis 05/13/2017  . Acute on chronic pancreatitis ([place]) 01/17/2017  . Acute recurrent pancreatitis   . History of alcohol abuse   . Hypokalemia   . Non-intractable vomiting with nausea   . Essential hypertension   . Esophageal reflux   . Chronic pancreatitis ([place]) 11/23/2015    Past Surgical History:  Procedure Laterality Date  . NO PAST SURGERIES          Home Medications    Prior to Admission medications   Medication Sig Start Date End Date Taking? Authorizing Provider    Carboxymethylcellulose Sodium (EYE DROPS OP) Apply 2 drops to eye daily.    [provider]  diltiazem (DILACOR XR) 120 MG 24 hr capsule Take 120 mg by mouth daily.    [provider]  famotidine (PEPCID) 20 MG tablet Take 1 tablet (20 mg total) by mouth 2 (two) times daily for 15 days. Patient taking differently: Take 20 mg by mouth daily as needed for heartburn.  05/16/17 08/25/17  Aline August, MD  feeding supplement (BOOST / RESOURCE BREEZE) LIQD Take 1 Container by mouth 3 (three) times daily between meals. 01/20/17   Mikhail, Velta Addison, DO  Ibuprofen-diphenhydrAMINE HCl 200-25 MG CAPS Take 1 tablet by mouth daily as needed (Pain, Sleep).    [provider]  lipase/protease/amylase (CREON) 36000 UNITS CPEP capsule Take 1 capsule (36,000 Units total) by mouth as directed. Take two with meals and one with snacks Patient taking differently: Take 36,000 Units by mouth See admin instructions. Take one capsules (36,000 units) by mouth with meals (2 meals daily) and one capsule (36000 units) with snacks daily 01/31/17   Willia Craze, NP  promethazine (PHENERGAN) 25 MG tablet Take 1 tablet (25 mg total) by mouth every 6 (six) hours as needed for nausea or vomiting. 08/17/17   Daleen Bo, MD  traMADol (ULTRAM) 50 MG tablet Take 2 tablets (100 mg total) by mouth every 6 (six) hours as needed for moderate pain or severe pain (Until  hopsital follow up with PCP, resume 50 mg regimen thereafter). 08/29/17   Thomasene Ripple, MD    Family History Family History  Problem Relation Age of Onset  . Hypertension Other   . Hypertension Mother   . Hypertension Father   . Kidney disease Father   . Hypertension Sister   . Diabetes Sister   . Hypertension Brother     Social History Social History   Tobacco Use  . Smoking status: Never Smoker  . Smokeless tobacco: Never Used  Substance Use Topics  . Alcohol use: Not Currently    Comment: 05/13/2017 "might have beer a few  times/year"  . Drug use: No     Allergies   Diclofenac; Norvasc [amlodipine besylate]; Omeprazole; and Prednisone   Review of Systems Review of Systems  All other systems reviewed and are negative.    Physical Exam Updated Vital Signs BP 135/87 (BP Location: Right Arm)   Pulse 75   Temp 98.3 F (36.8 C) (Oral)   Resp 16   Ht 6\' 1"  (1.854 m)   Wt 81.2 kg (179 lb)   SpO2 98%   BMI 23.62 kg/m   Physical Exam  Constitutional: He is oriented to person, place, and time. He appears well-developed and well-nourished.  HENT:  Head: Normocephalic and atraumatic.  Eyes: EOM are normal.  Neck: Normal range of motion.  Cardiovascular: Normal rate, regular rhythm, normal heart sounds and intact distal pulses.  Pulmonary/Chest: Effort normal and breath sounds normal. No respiratory distress.  Abdominal: Soft. He exhibits no distension.  Epigastric tenderness  Musculoskeletal: Normal range of motion.  Neurological: He is alert and oriented to person, place, and time.  Skin: Skin is warm and dry.  Psychiatric: He has a normal mood and affect. Judgment normal.  Nursing note and vitals reviewed.    ED Treatments / Results  Labs (all labs ordered are listed, but only abnormal results are displayed) Labs Reviewed  LIPASE, BLOOD - Abnormal; Notable for the following components:      Result Value   Lipase 85 (*)    All other components within normal limits  COMPREHENSIVE METABOLIC PANEL - Abnormal; Notable for the following components:   Glucose, Bld 121 (*)    AST 69 (*)    Total Bilirubin 2.5 (*)    All other components within normal limits  CBC - Abnormal; Notable for the following components:   WBC 12.5 (*)    All other components within normal limits  URINALYSIS, ROUTINE W REFLEX MICROSCOPIC - Abnormal; Notable for the following components:   Color, Urine AMBER (*)    APPearance HAZY (*)    Hgb urine dipstick MODERATE (*)    Ketones, ur 20 (*)    Protein, ur 30 (*)      All other components within normal limits  URINE CULTURE    EKG None  Radiology Ct Renal Stone Study  Result Date: 10/11/2017 CLINICAL DATA:  47 year old male with left-sided flank pain. EXAM: CT ABDOMEN AND PELVIS WITHOUT CONTRAST TECHNIQUE: Multidetector CT imaging of the abdomen and pelvis was performed following the standard protocol without IV contrast. COMPARISON:  Abdominal ultrasound dated 08/25/2017 and CT of abdomen pelvis dated 01/17/2017 FINDINGS: Evaluation of this exam is limited in the absence of intravenous contrast. Lower chest: The visualized lung bases are clear. No intra-abdominal free air or free fluid. Hepatobiliary: There is mild fatty infiltration of the liver. No intrahepatic biliary ductal dilatation. The gallbladder is unremarkable. Pancreas: There is inflammatory changes  of the pancreas with peripancreatic stranding most consistent with acute pancreatitis. Correlation with pancreatic enzymes recommended. No fluid collection, abscess, or pseudocyst. Spleen: Normal in size without focal abnormality. Adrenals/Urinary Tract: The adrenal glands are unremarkable. The right kidney is atrophic. A 12 mm hypodense lesion arising from the right kidney is not characterized but appears similar to the CT of 2018, likely a cyst. Nonobstructing left renal inferior pole calculi measure up to 6 mm. There is no hydronephrosis. The left ureter and urinary bladder appear unremarkable. Stomach/Bowel: Multiple small diverticula noted in the ascending colon. There is no bowel obstruction or active inflammation. The appendix is normal. Vascular/Lymphatic: The abdominal aorta and IVC appear unremarkable on this noncontrast CT. No portal venous gas. There is no adenopathy. Reproductive: The prostate and seminal vesicles are grossly unremarkable. No pelvic mass Other: None Musculoskeletal: Degenerative changes at L5-S1. No acute osseous pathology. IMPRESSION: 1. Acute pancreatitis.  No abscess. 2.  Nonobstructing left renal inferior pole calculi. No hydronephrosis. Atrophic right kidney. 3. Fatty liver. 4. Small cecal and ascending colon diverticula without active inflammatory changes. No bowel obstruction. Normal appendix. Electronically Signed   By: Anner Crete M.D.   On: 10/11/2017 21:46    Procedures Procedures (including critical care time)  Medications Ordered in ED Medications  sodium chloride 0.9 % bolus 1,000 mL (1,000 mLs Intravenous New Bag/Given 10/11/17 2317)  ondansetron (ZOFRAN-ODT) disintegrating tablet 4 mg (4 mg Oral Given 10/11/17 1445)  morphine 4 MG/ML injection 6 mg (6 mg Intramuscular Given 10/11/17 1445)  morphine 4 MG/ML injection 6 mg (6 mg Intramuscular Given 10/11/17 2123)  HYDROmorphone (DILAUDID) injection 1 mg (1 mg Intravenous Given 10/11/17 2317)     Initial Impression / Assessment and Plan / ED Course  I have reviewed the triage vital signs and the nursing notes.  Pertinent labs & imaging results that were available during my care of the patient were reviewed by me and considered in my medical decision making (see chart for details).     Acute pancreatitis.  Patient will be admitted to hospital for ongoing pain and symptom control.  CT scan without other acute findings  Prior hospitalization and discharge summary personally reviewed  Disposition: Admission   Final Clinical Impressions(s) / ED Diagnoses   Final diagnoses:  Generalized abdominal pain  Acute pancreatitis, unspecified complication status, unspecified pancreatitis type    ED Discharge Orders    None       Jola Schmidt, MD 10/11/17 2327

## 2017-10-11 NOTE — ED Provider Notes (Signed)
MSE was initiated and I personally evaluated the patient and placed orders (if any) at  2:26 PM on October 11, 2017.  The patient appears stable so that the remainder of the MSE may be completed by another provider.  Patient placed in Quick Look pathway, seen and evaluated   Chief Complaint: Abdominal pain  HPI:   Pt with history of recurrent pancreatitis, alcohol use, GERD presenting for epigastric abdominal pain in a band across the upper abdomen. N/V since this AM with small streaks of blood. No bilious vomiting. Associated right sided flank pain. Pt reporting hx of "nonfunctional" left kidney. Pt reports that this feels similar to prior pancreatitis.  ROS: See HPI.   Physical Exam:   Gen: No distress  Neuro: Awake and Alert  Skin: Warm    Focused Exam: Epigastric pain w/o guarding or rebound. No CVA TTP. BS +.   Initiation of care has begun. The patient has been counseled on the process, plan, and necessity for staying for the completion/evaluation, and the remainder of the medical screening examination    Tamala Julian 10/11/17 1456    Jola Schmidt, MD 10/11/17 2314

## 2017-10-11 NOTE — H&P (Signed)
History and Physical    Kyle Berry:[id number] DOB: 1971/01/23 DOA: 10/11/2017  Referring MD/NP/PA:   PCP: Kerin Perna, NP   Patient coming from:  The patient is coming from home.  At baseline, pt is independent for most of ADL.   Chief Complaint: Nausea, vomiting, abdominal, pain chest pain  HPI: Kyle Berry is a 47 y.o. male with medical history significant of chronic pancreatitis, alcohol abuse in remission, hypertension, hyperlipidemia, GERD, congenital single kidney, who presents with nausea, vomiting, abdominal pain and chest pain.  Patient states that his abdominal pain started yesterday, which is located in the epigastric area, sharp, 10 out of 10 severity, radiating to bilateral flank area.  He also has nausea and vomited more than 10 times.  He had 1 loose stool bowel movement this morning, currently no diarrhea. Patient states that he has mild substernal chest pain and mild shortness of breath.  He states that each time when he has pancreatitis flareup, he will have mild chest pain or shortness of breath.  He thinks the chest pain is most likely caused by his pancreatitis flareup.  No tenderness in the calf area.  Denies cough, fever or chills.  He states that he has increasing urinary frequency sometimes, but no dysuria or burning on urination.  No hematuria. No unilateral weakness. He states that he stopped drinking alcohol long time ago.  ED Course: pt was found to have lipase 85, WBC 5.5, electrolytes renal function okay, urinalysis is negative for UTI, but showed positive hemoglobin.  Patient is placed on telemetry bed of observation.  CT-renal stone study: 1. Acute pancreatitis.  No abscess. 2. Nonobstructing left renal inferior pole calculi. No hydronephrosis. Atrophic right kidney. 3. Fatty liver. 4. Small cecal and ascending colon diverticula without active inflammatory changes. No bowel obstruction. Normal appendix.  Review of Systems:    General: no fevers, chills, no body weight gain, has poor appetite, has fatigue HEENT: no blurry vision, hearing changes or sore throat Respiratory: has dyspnea, no coughing, wheezing CV: has chest pain, no palpitations GI: has nausea, vomiting, abdominal pain, no diarrhea, constipation GU: no dysuria, burning on urination, increased urinary frequency, hematuria  Ext: no leg edema Neuro: no unilateral weakness, numbness, or tingling, no vision change or hearing loss Skin: no rash, no skin tear. MSK: No muscle spasm, no deformity, no limitation of range of movement in spin Heme: No easy bruising.  Travel history: No recent long distant travel.  Allergy:  Allergies  Allergen Reactions  . Diclofenac Hives  . Norvasc [Amlodipine Besylate] Other (See Comments)    Fluid buildup in chest  . Omeprazole Other (See Comments)    MD stopped due to pancreatitis  . Prednisone Other (See Comments)    Mood swings    Past Medical History:  Diagnosis Date  . Acute pancreatitis 11/23/2015  . Chronic bronchitis ([place])   . Chronic lower back pain   . Congenital single kidney   . GERD (gastroesophageal reflux disease)   . Headache    "once/month" (05/13/2017)  . High cholesterol   . History of kidney stones   . Hypertension   . Migraine    "a couple/year" (05/13/2017)  . Pneumonia ~ 09/2015  . Recurrent acute pancreatitis   . Renal disorder     Past Surgical History:  Procedure Laterality Date  . NO PAST SURGERIES      Social History:  reports that he has never smoked. He has never used smokeless tobacco. He  reports that he drank alcohol. He reports that he does not use drugs.  Family History:  Family History  Problem Relation Age of Onset  . Hypertension Other   . Hypertension Mother   . Hypertension Father   . Kidney disease Father   . Hypertension Sister   . Diabetes Sister   . Hypertension Brother      Prior to Admission medications   Medication Sig Start Date End Date  Taking? Authorizing Provider  Carboxymethylcellulose Sodium (EYE DROPS OP) Apply 2 drops to eye daily.    [provider]  diltiazem (DILACOR XR) 120 MG 24 hr capsule Take 120 mg by mouth daily.    [provider]  famotidine (PEPCID) 20 MG tablet Take 1 tablet (20 mg total) by mouth 2 (two) times daily for 15 days. Patient taking differently: Take 20 mg by mouth daily as needed for heartburn.  05/16/17 08/25/17  Aline August, MD  feeding supplement (BOOST / RESOURCE BREEZE) LIQD Take 1 Container by mouth 3 (three) times daily between meals. 01/20/17   Mikhail, Velta Addison, DO  Ibuprofen-diphenhydrAMINE HCl 200-25 MG CAPS Take 1 tablet by mouth daily as needed (Pain, Sleep).    [provider]  lipase/protease/amylase (CREON) 36000 UNITS CPEP capsule Take 1 capsule (36,000 Units total) by mouth as directed. Take two with meals and one with snacks Patient taking differently: Take 36,000 Units by mouth See admin instructions. Take one capsules (36,000 units) by mouth with meals (2 meals daily) and one capsule (36000 units) with snacks daily 01/31/17   Willia Craze, NP  promethazine (PHENERGAN) 25 MG tablet Take 1 tablet (25 mg total) by mouth every 6 (six) hours as needed for nausea or vomiting. 08/17/17   Daleen Bo, MD  traMADol (ULTRAM) 50 MG tablet Take 2 tablets (100 mg total) by mouth every 6 (six) hours as needed for moderate pain or severe pain (Until hopsital follow up with PCP, resume 50 mg regimen thereafter). 08/29/17   Thomasene Ripple, MD    Physical Exam: Vitals:   10/12/17 0002 10/12/17 0047 10/12/17 0048 10/12/17 0603  BP:  (!) 147/114 (!) 147/102 137/84  Pulse:  79 70 66  Resp:  16  16  Temp:  98.1 F (36.7 C)  (!) 97.5 F (36.4 C)  TempSrc:  Oral  Oral  SpO2: 98% 98%  98%  Weight:      Height:       General: Not in acute distress HEENT:       Eyes: PERRL, EOMI, no scleral icterus.       ENT: No discharge from the ears and nose, no pharynx  injection, no tonsillar enlargement.        Neck: No JVD, no bruit, no mass felt. Heme: No neck lymph node enlargement. Cardiac: S1/S2, RRR, No murmurs, No gallops or rubs. Respiratory: No rales, wheezing, rhonchi or rubs. GI: Soft, nondistended, has tenderness in epigastric area. No rebound pain, no organomegaly, BS present. GU: No hematuria Ext: No pitting leg edema bilaterally. 2+DP/PT pulse bilaterally. Musculoskeletal: No joint deformities, No joint redness or warmth, no limitation of ROM in spin. Skin: No rashes.  Neuro: Alert, oriented X3, cranial nerves II-XII grossly intact, moves all extremities normally. Psych: Patient is not psychotic, no suicidal or hemocidal ideation.  Labs on Admission: I have personally reviewed following labs and imaging studies  CBC: Recent Labs  Lab 10/11/17 1431 10/12/17 0505  WBC 12.5* 8.4  HGB 14.4 13.2  HCT 42.8 39.9  MCV 98.2 98.8  PLT 195 160   Basic Metabolic Panel: Recent Labs  Lab 10/11/17 1431  NA 137  K 4.4  CL 100  CO2 26  GLUCOSE 121*  BUN 9  CREATININE 1.06  CALCIUM 9.0   GFR: Estimated Creatinine Clearance: 96.3 mL/min (by C-G formula based on SCr of 1.06 mg/dL). Liver Function Tests: Recent Labs  Lab 10/11/17 1431  AST 69*  ALT 37  ALKPHOS 62  BILITOT 2.5*  PROT 7.0  ALBUMIN 3.9   Recent Labs  Lab 10/11/17 1431  LIPASE 85*   No results for input(s): AMMONIA in the last 168 hours. Coagulation Profile: No results for input(s): INR, PROTIME in the last 168 hours. Cardiac Enzymes: Recent Labs  Lab 10/12/17 0043  TROPONINI <0.03   BNP (last 3 results) No results for input(s): PROBNP in the last 8760 hours. HbA1C: Recent Labs    10/12/17 0042  HGBA1C 5.4   CBG: No results for input(s): GLUCAP in the last 168 hours. Lipid Profile: No results for input(s): CHOL, HDL, LDLCALC, TRIG, CHOLHDL, LDLDIRECT in the last 72 hours. Thyroid Function Tests: No results for input(s): TSH, T4TOTAL, FREET4,  T3FREE, THYROIDAB in the last 72 hours. Anemia Panel: No results for input(s): VITAMINB12, FOLATE, FERRITIN, TIBC, IRON, RETICCTPCT in the last 72 hours. Urine analysis:    Component Value Date/Time   COLORURINE AMBER (A) 10/11/2017 2156   APPEARANCEUR HAZY (A) 10/11/2017 2156   LABSPEC 1.029 10/11/2017 2156   PHURINE 5.0 10/11/2017 2156   GLUCOSEU NEGATIVE 10/11/2017 2156   HGBUR MODERATE (A) 10/11/2017 2156   BILIRUBINUR NEGATIVE 10/11/2017 2156   KETONESUR 20 (A) 10/11/2017 2156   PROTEINUR 30 (A) 10/11/2017 2156   UROBILINOGEN 0.2 01/20/2015 1643   NITRITE NEGATIVE 10/11/2017 2156   LEUKOCYTESUR NEGATIVE 10/11/2017 2156   Sepsis Labs: @LABRCNTIP (procalcitonin:4,lacticidven:4) )No results found for this or any previous visit (from the past 240 hour(s)).   Radiological Exams on Admission: Ct Renal Stone Study  Result Date: 10/11/2017 CLINICAL DATA:  47 year old male with left-sided flank pain. EXAM: CT ABDOMEN AND PELVIS WITHOUT CONTRAST TECHNIQUE: Multidetector CT imaging of the abdomen and pelvis was performed following the standard protocol without IV contrast. COMPARISON:  Abdominal ultrasound dated 08/25/2017 and CT of abdomen pelvis dated 01/17/2017 FINDINGS: Evaluation of this exam is limited in the absence of intravenous contrast. Lower chest: The visualized lung bases are clear. No intra-abdominal free air or free fluid. Hepatobiliary: There is mild fatty infiltration of the liver. No intrahepatic biliary ductal dilatation. The gallbladder is unremarkable. Pancreas: There is inflammatory changes of the pancreas with peripancreatic stranding most consistent with acute pancreatitis. Correlation with pancreatic enzymes recommended. No fluid collection, abscess, or pseudocyst. Spleen: Normal in size without focal abnormality. Adrenals/Urinary Tract: The adrenal glands are unremarkable. The right kidney is atrophic. A 12 mm hypodense lesion arising from the right kidney is not  characterized but appears similar to the CT of 2018, likely a cyst. Nonobstructing left renal inferior pole calculi measure up to 6 mm. There is no hydronephrosis. The left ureter and urinary bladder appear unremarkable. Stomach/Bowel: Multiple small diverticula noted in the ascending colon. There is no bowel obstruction or active inflammation. The appendix is normal. Vascular/Lymphatic: The abdominal aorta and IVC appear unremarkable on this noncontrast CT. No portal venous gas. There is no adenopathy. Reproductive: The prostate and seminal vesicles are grossly unremarkable. No pelvic mass Other: None Musculoskeletal: Degenerative changes at L5-S1. No acute osseous pathology. IMPRESSION: 1. Acute pancreatitis.  No  abscess. 2. Nonobstructing left renal inferior pole calculi. No hydronephrosis. Atrophic right kidney. 3. Fatty liver. 4. Small cecal and ascending colon diverticula without active inflammatory changes. No bowel obstruction. Normal appendix. Electronically Signed   By: Anner Crete M.D.   On: 10/11/2017 21:46     EKG:   Not done in ED, will get one.   Assessment/Plan Principal Problem:   Acute on chronic pancreatitis [facility]) Active Problems:   Essential hypertension   Esophageal reflux   History of alcohol abuse   GERD (gastroesophageal reflux disease)   Chest pain   Acute on chronic pancreatitis ([place]): Lipase 85.  Patient states that he stopped drinking alcohol for long time. -will place on tele bed for observation -NPO for pancreatitis -IVF: 1LNS and then at 150 cc/hr -IV dilaudid for pain control, IV Phenergan for nausea - pt had an negative abd-US on 08/25/17, will not repeat today -check lipid panel to rule out triglyceridemia.  HTN:  -Continue home medications: diltiazem -IV hydralazine prn  Esophageal reflux: -pepcid  History of alcohol abuse: -in remisision  Chest pain: pt has atypical chest pain, associated with mild shortness of breath.  Per patient, this is  most likely due to pancreatitis flareup. -Troponin x3 -get EKG, check A1c, FLP -PRN nitroglycerin. -f/u CXR   DVT ppx: SQ Lovenox Code Status: Full code Family Communication: None at bed side. Disposition Plan:  Anticipate discharge back to previous home environment Consults called:  none Admission status: Obs / tele    Date of Service 10/12/2017    Ivor Costa [facility] Pager [phone]  If 7PM-7AM, please contact night-coverage [email or web address] Password [facility] 10/12/2017, 6:26 AM

## 2017-10-11 NOTE — ED Notes (Signed)
Patient transported to CT 

## 2017-10-11 NOTE — ED Triage Notes (Signed)
Pt endorses abd pain radiating to bilateral flanks since last night with vomiting and urinary frequency. Has hx of pancreatitis and kidney stones and states it feels like pancreatitis. VSS.

## 2017-10-12 ENCOUNTER — Observation Stay (HOSPITAL_COMMUNITY): Payer: Managed Care, Other (non HMO)

## 2017-10-12 DIAGNOSIS — R1084 Generalized abdominal pain: Secondary | ICD-10-CM | POA: Insufficient documentation

## 2017-10-12 DIAGNOSIS — K859 Acute pancreatitis without necrosis or infection, unspecified: Principal | ICD-10-CM

## 2017-10-12 DIAGNOSIS — K861 Other chronic pancreatitis: Secondary | ICD-10-CM

## 2017-10-12 LAB — TROPONIN I
Troponin I: <0.03 ng/mL (ref <0.03)
Troponin I: <0.03 ng/mL (ref <0.03)
Troponin I: <0.03 ng/mL (ref <0.03)

## 2017-10-12 LAB — CBC
HCT: 39.9 % (ref 39.0–52.0)
HEMOGLOBIN: 13.2 g/dL (ref 13.0–17.0)
MCH: 32.7 pg (ref 26.0–34.0)
MCHC: 33.1 g/dL (ref 30.0–36.0)
MCV: 98.8 fL (ref 78.0–100.0)
Platelets: 183 10*3/uL (ref 150–400)
RBC: 4.04 MIL/uL — ABNORMAL LOW (ref 4.22–5.81)
RDW: 13.2 % (ref 11.5–15.5)
WBC: 8.4 10*3/uL (ref 4.0–10.5)

## 2017-10-12 LAB — RAPID URINE DRUG SCREEN, HOSP PERFORMED
AMPHETAMINES: NOT DETECTED
BENZODIAZEPINES: NOT DETECTED
COCAINE: NOT DETECTED
Opiates: POSITIVE — AB
TETRAHYDROCANNABINOL: NOT DETECTED

## 2017-10-12 LAB — BASIC METABOLIC PANEL
ANION GAP: 7 (ref 5–15)
CHLORIDE: 107 mmol/L (ref 98–111)
CO2: 25 mmol/L (ref 22–32)
Calcium: 8.3 mg/dL — ABNORMAL LOW (ref 8.9–10.3)
Creatinine, Ser: 1.01 mg/dL (ref 0.61–1.24)
GFR calc Af Amer: >60 mL/min (ref >60)
GFR calc non Af Amer: >60 mL/min (ref >60)
GLUCOSE: 120 mg/dL — AB (ref 70–99)
POTASSIUM: 3.4 mmol/L — AB (ref 3.5–5.1)
Sodium: 139 mmol/L (ref 135–145)

## 2017-10-12 LAB — HEMOGLOBIN A1C
Hgb A1c MFr Bld: 5.4 % (ref 4.8–5.6)
MEAN PLASMA GLUCOSE: 108.28 mg/dL

## 2017-10-12 LAB — LIPID PANEL
CHOLESTEROL: 190 mg/dL (ref 0–200)
HDL: 34 mg/dL — AB (ref >40)
LDL Cholesterol: 87 mg/dL (ref 0–99)
TRIGLYCERIDES: 343 mg/dL — AB (ref <150)
Total CHOL/HDL Ratio: 5.6 RATIO
VLDL: 69 mg/dL — ABNORMAL HIGH (ref 0–40)

## 2017-10-12 LAB — GLUCOSE, CAPILLARY: Glucose-Capillary: 109 mg/dL — ABNORMAL HIGH (ref 70–99)

## 2017-10-12 MED ORDER — HYDRALAZINE HCL 20 MG/ML IJ SOLN: 5.0000 mg | INTRAMUSCULAR | Status: DC | PRN

## 2017-10-12 MED ORDER — HYDROMORPHONE HCL 1 MG/ML IJ SOLN
1.0000 mg | Freq: Once | INTRAMUSCULAR | Status: AC
  Administered 2017-10-12: 1 mg via INTRAVENOUS
  Filled 2017-10-12: qty 1

## 2017-10-12 MED ORDER — ENOXAPARIN SODIUM 40 MG/0.4ML ~~LOC~~ SOLN
40.0000 mg | SUBCUTANEOUS | Status: DC
  Administered 2017-10-12 – 2017-10-14 (×3): 40 mg via SUBCUTANEOUS
  Filled 2017-10-12 (×3): qty 0.4

## 2017-10-12 MED ORDER — PROMETHAZINE HCL 25 MG/ML IJ SOLN
25.0000 mg | Freq: Four times a day (QID) | INTRAMUSCULAR | Status: DC | PRN
Start: 2017-10-12 — End: 2017-10-14
  Administered 2017-10-12 – 2017-10-14 (×4): 25 mg via INTRAVENOUS
  Filled 2017-10-12 (×4): qty 1

## 2017-10-12 MED ORDER — OXYCODONE HCL 5 MG PO TABS
5.0000 mg | ORAL_TABLET | Freq: Four times a day (QID) | ORAL | Status: DC | PRN
  Administered 2017-10-12 – 2017-10-14 (×5): 5 mg via ORAL
  Filled 2017-10-12 (×5): qty 1

## 2017-10-12 MED ORDER — POLYETHYLENE GLYCOL 3350 17 G PO PACK: 17.0000 g | PACK | Freq: Every day | ORAL | Status: DC | PRN

## 2017-10-12 MED ORDER — POTASSIUM CHLORIDE CRYS ER 20 MEQ PO TBCR
40.0000 meq | EXTENDED_RELEASE_TABLET | Freq: Once | ORAL | Status: AC
  Administered 2017-10-12: 40 meq via ORAL
  Filled 2017-10-12: qty 2

## 2017-10-12 MED ORDER — FAMOTIDINE 20 MG PO TABS
20.0000 mg | ORAL_TABLET | Freq: Two times a day (BID) | ORAL | Status: DC
  Administered 2017-10-12 – 2017-10-14 (×4): 20 mg via ORAL
  Filled 2017-10-12 (×4): qty 1

## 2017-10-12 MED ORDER — ZOLPIDEM TARTRATE 5 MG PO TABS
5.0000 mg | ORAL_TABLET | Freq: Every evening | ORAL | Status: DC | PRN
  Administered 2017-10-12 – 2017-10-13 (×2): 5 mg via ORAL
  Filled 2017-10-12 (×2): qty 1

## 2017-10-12 MED ORDER — SODIUM CHLORIDE 0.9 % IV SOLN
INTRAVENOUS | Status: DC
  Administered 2017-10-12 – 2017-10-14 (×8): via INTRAVENOUS

## 2017-10-12 NOTE — Progress Notes (Signed)
Patient Demographics:    Kyle Berry, is a 47 y.o. male, DOB - Jan 08, 1971, [id number]  Admit date - 10/11/2017   Admitting Physician Lorretta Harp, MD  Outpatient Primary MD for the patient is Grayce Sessions, NP  LOS - 0   Chief Complaint  Patient presents with  . Abdominal Pain        Subjective:    Kyle Berry today has no fevers, no emesis,  No chest pain, patient wants to eat  Assessment  & Plan :    Principal Problem:   Acute on chronic pancreatitis [facility]) Active Problems:   Essential hypertension   Esophageal reflux   History of alcohol abuse   GERD (gastroesophageal reflux disease)   Chest pain  Brief summary  47 y.o. male with medical history significant of chronic pancreatitis, alcohol abuse in remission, hypertension, hyperlipidemia, GERD, congenital single kidney, who presents with nausea, vomiting, abdominal pain and chest (Epigastric) pain and admitted on 10/11/2017   Plan:- 1) acute on chronic pancreatitis--try clear liquid diet, lipase was 84, give Pepcid (he is apparently is intolerant to PPIs), CT abdomen findings noted, try glycerides less than 350, CT abdomen does show fatty liver and confirms acute pancreatitis, c/n Creon, IV fluids as ordered  2) Atypical chest pain--on further questioning patient describes a sharp epigastric pain rather than chest pain, okay to rule out ACS with serial EKG and cardiac enzymes, LDL is 87 with an HDL of 34 consider further outpatient work-up.   3)HTN-PRN IV hydralazine as ordered, continue Cardizem CD 120 mg daily  Code Status : Full  Disposition Plan  : home  DVT Prophylaxis  :  Lovenox  Lab Results  Component Value Date   PLT 183 10/12/2017    Inpatient Medications  Scheduled Meds: . diltiazem  120 mg Oral Daily  . enoxaparin (LOVENOX) injection  40 mg Subcutaneous Q24H  . famotidine  20 mg Oral BID  .  lipase/protease/amylase  36,000 Units Oral TID WC  . tetrahydrozoline  1 drop Both Eyes BID   Continuous Infusions: . sodium chloride 150 mL/hr at 10/12/17 1447   PRN Meds:.hydrALAZINE, HYDROmorphone (DILAUDID) injection, nitroGLYCERIN, oxyCODONE, polyethylene glycol, promethazine, traMADol, zolpidem    Anti-infectives (From admission, onward)   None        Objective:   Vitals:   10/12/17 0048 10/12/17 0603 10/12/17 1325 10/12/17 1327  BP: (!) 147/102 137/84 (!) 157/99 (!) 145/94  Pulse: 70 66 72 71  Resp:  16 16   Temp:  (!) 97.5 F (36.4 C) 98 F (36.7 C)   TempSrc:  Oral Oral   SpO2:  98% 98%   Weight:      Height:        Wt Readings from Last 3 Encounters:  10/11/17 81.2 kg (179 lb)  08/25/17 80.7 kg (178 lb)  08/17/17 80.7 kg (178 lb)     Intake/Output Summary (Last 24 hours) at 10/12/2017 1702 Last data filed at 10/12/2017 1252 Gross per 24 hour  Intake 620 ml  Output 250 ml  Net 370 ml     Physical Exam  Gen:- Awake Alert,  In no apparent distress  HEENT:- [place].AT, No sclera icterus Neck-Supple Neck,No JVD,.  Lungs-  CTAB , good air movement  CV- S1, S2 normal Abd-  +ve B.Sounds, Abd Soft, epigastric and left upper quadrant tenderness,    Extremity/Skin:- No  edema,   Good pulses Psych-affect is appropriate, oriented x3 Neuro-no new focal deficits, no tremors   Data Review:   Micro Results No results found for this or any previous visit (from the past 240 hour(s)).  Radiology Reports Dg Chest Port 1 View  Result Date: 10/12/2017 CLINICAL DATA:  Chest pain. EXAM: PORTABLE CHEST 1 VIEW COMPARISON:  04/08/2017. FINDINGS: Normal sized heart with normal vascularity. Mild left basilar atelectasis and minimal right basilar atelectasis. Otherwise, clear lungs. Small left pleural effusion mild scoliosis. IMPRESSION: 1. Mild left basilar atelectasis and minimal right basilar atelectasis. 2. Small left pleural effusion. Electronically Signed   By: Beckie Salts M.D.   On: 10/12/2017 08:10   Ct Renal Stone Study  Result Date: 10/11/2017 CLINICAL DATA:  47 year old male with left-sided flank pain. EXAM: CT ABDOMEN AND PELVIS WITHOUT CONTRAST TECHNIQUE: Multidetector CT imaging of the abdomen and pelvis was performed following the standard protocol without IV contrast. COMPARISON:  Abdominal ultrasound dated 08/25/2017 and CT of abdomen pelvis dated 01/17/2017 FINDINGS: Evaluation of this exam is limited in the absence of intravenous contrast. Lower chest: The visualized lung bases are clear. No intra-abdominal free air or free fluid. Hepatobiliary: There is mild fatty infiltration of the liver. No intrahepatic biliary ductal dilatation. The gallbladder is unremarkable. Pancreas: There is inflammatory changes of the pancreas with peripancreatic stranding most consistent with acute pancreatitis. Correlation with pancreatic enzymes recommended. No fluid collection, abscess, or pseudocyst. Spleen: Normal in size without focal abnormality. Adrenals/Urinary Tract: The adrenal glands are unremarkable. The right kidney is atrophic. A 12 mm hypodense lesion arising from the right kidney is not characterized but appears similar to the CT of 2018, likely a cyst. Nonobstructing left renal inferior pole calculi measure up to 6 mm. There is no hydronephrosis. The left ureter and urinary bladder appear unremarkable. Stomach/Bowel: Multiple small diverticula noted in the ascending colon. There is no bowel obstruction or active inflammation. The appendix is normal. Vascular/Lymphatic: The abdominal aorta and IVC appear unremarkable on this noncontrast CT. No portal venous gas. There is no adenopathy. Reproductive: The prostate and seminal vesicles are grossly unremarkable. No pelvic mass Other: None Musculoskeletal: Degenerative changes at L5-S1. No acute osseous pathology. IMPRESSION: 1. Acute pancreatitis.  No abscess. 2. Nonobstructing left renal inferior pole calculi. No  hydronephrosis. Atrophic right kidney. 3. Fatty liver. 4. Small cecal and ascending colon diverticula without active inflammatory changes. No bowel obstruction. Normal appendix. Electronically Signed   By: Elgie Collard M.D.   On: 10/11/2017 21:46     CBC Recent Labs  Lab 10/11/17 1431 10/12/17 0505  WBC 12.5* 8.4  HGB 14.4 13.2  HCT 42.8 39.9  PLT 195 183  MCV 98.2 98.8  MCH 33.0 32.7  MCHC 33.6 33.1  RDW 13.2 13.2    Chemistries  Recent Labs  Lab 10/11/17 1431 10/12/17 0505  NA 137 139  K 4.4 3.4*  CL 100 107  CO2 26 25  GLUCOSE 121* 120*  BUN 9 <5*  CREATININE 1.06 1.01  CALCIUM 9.0 8.3*  AST 69*  --   ALT 37  --   ALKPHOS 62  --   BILITOT 2.5*  --    ------------------------------------------------------------------------------------------------------------------ Recent Labs    10/12/17 0505  CHOL 190  HDL 34*  LDLCALC 87  TRIG 952*  CHOLHDL 5.6    Lab Results  Component  Value Date   HGBA1C 5.4 10/12/2017   ------------------------------------------------------------------------------------------------------------------ No results for input(s): TSH, T4TOTAL, T3FREE, THYROIDAB in the last 72 hours.  Invalid input(s): FREET3 ------------------------------------------------------------------------------------------------------------------ No results for input(s): VITAMINB12, FOLATE, FERRITIN, TIBC, IRON, RETICCTPCT in the last 72 hours.  Coagulation profile No results for input(s): INR, PROTIME in the last 168 hours.  No results for input(s): DDIMER in the last 72 hours.  Cardiac Enzymes Recent Labs  Lab 10/12/17 0043 10/12/17 0505 10/12/17 1046  TROPONINI <0.03 <0.03 <0.03   ------------------------------------------------------------------------------------------------------------------    Component Value Date/Time   BNP 11.7 04/26/2015 1027     Al Bracewell M.D on 10/12/2017 at 5:02 PM  Between 7am to 7pm - Pager -  [phone]  After 7pm go to [email or web address] - password [email or web address]  [facility] -  Office  [phone]   Voice Recognition Reubin Milan dictation system was used to create this note, attempts have been made to correct errors. Please contact the author with questions and/or clarifications.

## 2017-10-13 ENCOUNTER — Other Ambulatory Visit: Payer: Self-pay

## 2017-10-13 DIAGNOSIS — E7800 Pure hypercholesterolemia, unspecified: Secondary | ICD-10-CM | POA: Diagnosis present

## 2017-10-13 DIAGNOSIS — K859 Acute pancreatitis without necrosis or infection, unspecified: Secondary | ICD-10-CM | POA: Diagnosis present

## 2017-10-13 DIAGNOSIS — I10 Essential (primary) hypertension: Secondary | ICD-10-CM | POA: Diagnosis present

## 2017-10-13 DIAGNOSIS — Z79899 Other long term (current) drug therapy: Secondary | ICD-10-CM | POA: Diagnosis not present

## 2017-10-13 DIAGNOSIS — K219 Gastro-esophageal reflux disease without esophagitis: Secondary | ICD-10-CM | POA: Diagnosis present

## 2017-10-13 DIAGNOSIS — R1084 Generalized abdominal pain: Secondary | ICD-10-CM | POA: Diagnosis present

## 2017-10-13 DIAGNOSIS — F1011 Alcohol abuse, in remission: Secondary | ICD-10-CM | POA: Diagnosis present

## 2017-10-13 DIAGNOSIS — Q600 Renal agenesis, unilateral: Secondary | ICD-10-CM | POA: Diagnosis not present

## 2017-10-13 DIAGNOSIS — K861 Other chronic pancreatitis: Secondary | ICD-10-CM | POA: Diagnosis present

## 2017-10-13 LAB — URINE CULTURE: CULTURE: NO GROWTH

## 2017-10-13 LAB — TROPONIN I: Troponin I: <0.03 ng/mL (ref <0.03)

## 2017-10-13 LAB — GLUCOSE, CAPILLARY: Glucose-Capillary: 91 mg/dL (ref 70–99)

## 2017-10-13 MED ORDER — ENSURE ENLIVE PO LIQD: 237.0000 mL | Freq: Two times a day (BID) | ORAL | Status: DC

## 2017-10-13 NOTE — Progress Notes (Signed)
Patient Demographics:    Kyle Berry, is a 47 y.o. male, DOB - 03/08/1971, [id number]:[id number]  Admit date - 10/11/2017   Admitting Physician Lorretta Harp, MD  Outpatient Primary MD for the patient is Grayce Sessions, NP  LOS - 0   Chief Complaint  Patient presents with  . Abdominal Pain        Subjective:    Kyle Berry today has no fevers, no emesis,  No chest pain, patient has some abdominal discomfort and nausea after trying full liquid diet, did better on clear liquids  Assessment  & Plan :    Principal Problem:   Acute on chronic pancreatitis [facility]) Active Problems:   Essential hypertension   Esophageal reflux   History of alcohol abuse   GERD (gastroesophageal reflux disease)   Chest pain  Brief summary  47 y.o. male with medical history significant of chronic pancreatitis, alcohol abuse in remission, hypertension, hyperlipidemia, GERD, congenital single kidney, who presents with nausea, vomiting, abdominal pain and chest (Epigastric) pain and admitted on 10/11/2017   Plan:- 1) acute on chronic pancreatitis-   patient has some abdominal discomfort and nausea after trying full liquid diet, did better on clear liquids,  lipase was 84, c/n Pepcid (he is apparently is intolerant to PPIs), CT abdomen findings noted, Triglycerides less than 350, CT abdomen does show fatty liver and confirms acute pancreatitis, c/n Creon, IV fluids as ordered  2) Atypical chest pain--on further questioning patient describes a sharp epigastric pain rather than chest pain, Ruled out already with for ACS with serial EKG and cardiac enzymes, LDL is 87 with an HDL of 34 . May consider further outpatient work-up.   3)HTN- stable, PRN IV hydralazine as ordered, continue Cardizem CD 120 mg daily  Social/ethics- Patient may go to [address] to Visit Relative---- maybe off [place]  Code Status : Full  Disposition  Plan  : home  DVT Prophylaxis  :  Lovenox  Lab Results  Component Value Date   PLT 183 10/12/2017    Inpatient Medications  Scheduled Meds: . diltiazem  120 mg Oral Daily  . enoxaparin (LOVENOX) injection  40 mg Subcutaneous Q24H  . famotidine  20 mg Oral BID  . lipase/protease/amylase  36,000 Units Oral TID WC  . tetrahydrozoline  1 drop Both Eyes BID   Continuous Infusions: . sodium chloride 150 mL/hr at 10/13/17 0534   PRN Meds:.hydrALAZINE, HYDROmorphone (DILAUDID) injection, nitroGLYCERIN, oxyCODONE, polyethylene glycol, promethazine, traMADol, zolpidem    Anti-infectives (From admission, onward)   None        Objective:   Vitals:   10/12/17 2123 10/12/17 2124 10/13/17 0452 10/13/17 0845  BP: (!) 160/110 (!) 164/100 125/82 134/82  Pulse: 86 79 79   Resp: 16  16   Temp: 97.7 F (36.5 C)  98.6 F (37 C)   TempSrc:      SpO2: 99%  98%   Weight:      Height:        Wt Readings from Last 3 Encounters:  10/11/17 81.2 kg (179 lb)  08/25/17 80.7 kg (178 lb)  08/17/17 80.7 kg (178 lb)     Intake/Output Summary (Last 24 hours) at 10/13/2017 1311 Last data filed at 10/13/2017 0906 Gross per  24 hour  Intake 986.38 ml  Output 1000 ml  Net -13.62 ml     Physical Exam  Gen:- Awake Alert,  In no apparent distress  HEENT:- [place].AT, No sclera icterus Neck-Supple Neck,No JVD,.  Lungs-  CTAB , good air movement CV- S1, S2 normal, regular Abd-  +ve B.Sounds, Abd Soft, less tender epigastric and left upper quadrant, no rebound or guarding Extremity/Skin:- No  edema,   Good pulses Psych-affect is appropriate, oriented x3 Neuro-no new focal deficits, no tremors   Data Review:   Micro Results Recent Results (from the past 240 hour(s))  Urine culture     Status: None   Collection Time: 10/11/17  9:56 PM  Result Value Ref Range Status   Specimen Description URINE, CLEAN CATCH  Final   Special Requests NONE  Final   Culture   Final    NO GROWTH Performed at  [facility], [address], [place], [place] [address]    Report Status 10/13/2017 FINAL  Final    Radiology Reports Dg Chest Port 1 View  Result Date: 10/12/2017 CLINICAL DATA:  Chest pain. EXAM: PORTABLE CHEST 1 VIEW COMPARISON:  04/08/2017. FINDINGS: Normal sized heart with normal vascularity. Mild left basilar atelectasis and minimal right basilar atelectasis. Otherwise, clear lungs. Small left pleural effusion mild scoliosis. IMPRESSION: 1. Mild left basilar atelectasis and minimal right basilar atelectasis. 2. Small left pleural effusion. Electronically Signed   By: Beckie Salts M.D.   On: 10/12/2017 08:10   Ct Renal Stone Study  Result Date: 10/11/2017 CLINICAL DATA:  47 year old male with left-sided flank pain. EXAM: CT ABDOMEN AND PELVIS WITHOUT CONTRAST TECHNIQUE: Multidetector CT imaging of the abdomen and pelvis was performed following the standard protocol without IV contrast. COMPARISON:  Abdominal ultrasound dated 08/25/2017 and CT of abdomen pelvis dated 01/17/2017 FINDINGS: Evaluation of this exam is limited in the absence of intravenous contrast. Lower chest: The visualized lung bases are clear. No intra-abdominal free air or free fluid. Hepatobiliary: There is mild fatty infiltration of the liver. No intrahepatic biliary ductal dilatation. The gallbladder is unremarkable. Pancreas: There is inflammatory changes of the pancreas with peripancreatic stranding most consistent with acute pancreatitis. Correlation with pancreatic enzymes recommended. No fluid collection, abscess, or pseudocyst. Spleen: Normal in size without focal abnormality. Adrenals/Urinary Tract: The adrenal glands are unremarkable. The right kidney is atrophic. A 12 mm hypodense lesion arising from the right kidney is not characterized but appears similar to the CT of 2018, likely a cyst. Nonobstructing left renal inferior pole calculi measure up to 6 mm. There is no hydronephrosis. The left ureter and urinary  bladder appear unremarkable. Stomach/Bowel: Multiple small diverticula noted in the ascending colon. There is no bowel obstruction or active inflammation. The appendix is normal. Vascular/Lymphatic: The abdominal aorta and IVC appear unremarkable on this noncontrast CT. No portal venous gas. There is no adenopathy. Reproductive: The prostate and seminal vesicles are grossly unremarkable. No pelvic mass Other: None Musculoskeletal: Degenerative changes at L5-S1. No acute osseous pathology. IMPRESSION: 1. Acute pancreatitis.  No abscess. 2. Nonobstructing left renal inferior pole calculi. No hydronephrosis. Atrophic right kidney. 3. Fatty liver. 4. Small cecal and ascending colon diverticula without active inflammatory changes. No bowel obstruction. Normal appendix. Electronically Signed   By: Elgie Collard M.D.   On: 10/11/2017 21:46     CBC Recent Labs  Lab 10/11/17 1431 10/12/17 0505  WBC 12.5* 8.4  HGB 14.4 13.2  HCT 42.8 39.9  PLT 195 183  MCV 98.2 98.8  MCH 33.0 32.7  MCHC 33.6 33.1  RDW 13.2 13.2    Chemistries  Recent Labs  Lab 10/11/17 1431 10/12/17 0505  NA 137 139  K 4.4 3.4*  CL 100 107  CO2 26 25  GLUCOSE 121* 120*  BUN 9 <5*  CREATININE 1.06 1.01  CALCIUM 9.0 8.3*  AST 69*  --   ALT 37  --   ALKPHOS 62  --   BILITOT 2.5*  --    ------------------------------------------------------------------------------------------------------------------ Recent Labs    10/12/17 0505  CHOL 190  HDL 34*  LDLCALC 87  TRIG 425*  CHOLHDL 5.6    Lab Results  Component Value Date   HGBA1C 5.4 10/12/2017   ------------------------------------------------------------------------------------------------------------------ No results for input(s): TSH, T4TOTAL, T3FREE, THYROIDAB in the last 72 hours.  Invalid input(s): FREET3 ------------------------------------------------------------------------------------------------------------------ No results for input(s):  VITAMINB12, FOLATE, FERRITIN, TIBC, IRON, RETICCTPCT in the last 72 hours.  Coagulation profile No results for input(s): INR, PROTIME in the last 168 hours.  No results for input(s): DDIMER in the last 72 hours.  Cardiac Enzymes Recent Labs  Lab 10/12/17 0505 10/12/17 1046 10/13/17 0429  TROPONINI <0.03 <0.03 <0.03   ------------------------------------------------------------------------------------------------------------------    Component Value Date/Time   BNP 11.7 04/26/2015 1027     Jaculin Rasmus M.D on 10/13/2017 at 1:11 PM  Between 7am to 7pm - Pager - [phone]  After 7pm go to [email or web address] - password [email or web address]  [facility] -  Office  [phone]   Voice Recognition Reubin Milan dictation system was used to create this note, attempts have been made to correct errors. Please contact the author with questions and/or clarifications.

## 2017-10-14 LAB — COMPREHENSIVE METABOLIC PANEL
ALBUMIN: 2.6 g/dL — AB (ref 3.5–5.0)
ALK PHOS: 40 U/L (ref 38–126)
ALT: 15 U/L (ref 0–44)
ANION GAP: 6 (ref 5–15)
AST: 20 U/L (ref 15–41)
BUN: <5 mg/dL — ABNORMAL LOW (ref 6–20)
CO2: 26 mmol/L (ref 22–32)
Calcium: 8 mg/dL — ABNORMAL LOW (ref 8.9–10.3)
Chloride: 103 mmol/L (ref 98–111)
Creatinine, Ser: 1.06 mg/dL (ref 0.61–1.24)
GFR calc Af Amer: >60 mL/min (ref >60)
GFR calc non Af Amer: >60 mL/min (ref >60)
GLUCOSE: 88 mg/dL (ref 70–99)
POTASSIUM: 3.5 mmol/L (ref 3.5–5.1)
SODIUM: 135 mmol/L (ref 135–145)
Total Bilirubin: 1.3 mg/dL — ABNORMAL HIGH (ref 0.3–1.2)
Total Protein: 5.5 g/dL — ABNORMAL LOW (ref 6.5–8.1)

## 2017-10-14 LAB — GLUCOSE, CAPILLARY: Glucose-Capillary: 85 mg/dL (ref 70–99)

## 2017-10-14 LAB — CBC
HCT: 34.4 % — ABNORMAL LOW (ref 39.0–52.0)
HEMOGLOBIN: 11.2 g/dL — AB (ref 13.0–17.0)
MCH: 32.7 pg (ref 26.0–34.0)
MCHC: 32.6 g/dL (ref 30.0–36.0)
MCV: 100.3 fL — ABNORMAL HIGH (ref 78.0–100.0)
PLATELETS: 165 10*3/uL (ref 150–400)
RBC: 3.43 MIL/uL — ABNORMAL LOW (ref 4.22–5.81)
RDW: 13.4 % (ref 11.5–15.5)
WBC: 5.8 10*3/uL (ref 4.0–10.5)

## 2017-10-14 MED ORDER — TRAMADOL HCL 50 MG PO TABS: ORAL_TABLET | ORAL | 0 refills | Status: DC

## 2017-10-14 MED ORDER — DILTIAZEM HCL ER 120 MG PO CP24: 120.0000 mg | ORAL_CAPSULE | Freq: Every day | ORAL | 2 refills | Status: DC

## 2017-10-14 MED ORDER — PROMETHAZINE HCL 25 MG PO TABS: 25.0000 mg | ORAL_TABLET | Freq: Four times a day (QID) | ORAL | 0 refills | Status: DC | PRN

## 2017-10-14 MED ORDER — PANCRELIPASE (LIP-PROT-AMYL) 36000-114000 UNITS PO CPEP: 36000.0000 [IU] | ORAL_CAPSULE | ORAL | 3 refills | Status: DC

## 2017-10-14 MED ORDER — FAMOTIDINE 20 MG PO TABS: 20.0000 mg | ORAL_TABLET | Freq: Two times a day (BID) | ORAL | 2 refills | Status: DC

## 2017-10-14 NOTE — Discharge Instructions (Signed)
1)Avoid ibuprofen/Advil/Aleve/Motrin/Goody Powders/Naproxen/BC powders/Meloxicam/Diclofenac/Indomethacin and other Nonsteroidal anti-inflammatory medications as these will make you more likely to bleed and can cause stomach ulcers, can also cause Kidney problems.  2)Avoid Alcohol  3) advance diet as tolerated,  4) follow-up with your primary care doctor within a week for recheck

## 2017-10-14 NOTE — Discharge Summary (Signed)
Kyle Berry, is a 47 y.o. male  DOB 1971/01/19  MRN [id number].  Admission date:  10/11/2017  Admitting Physician  Ivor Costa, MD  Discharge Date:  10/14/2017   Primary MD  Kerin Perna, NP  Recommendations for primary care physician for things to follow:   1)Avoid ibuprofen/Advil/Aleve/Motrin/Goody Powders/Naproxen/BC powders/Meloxicam/Diclofenac/Indomethacin and other Nonsteroidal anti-inflammatory medications as these will make you more likely to bleed and can cause stomach ulcers, can also cause Kidney problems.  2)Avoid Alcohol  3) advance diet as tolerated,  4) follow-up with your primary care doctor within a week for recheck   Admission Diagnosis  Generalized abdominal pain [R10.84] Acute pancreatitis, unspecified complication status, unspecified pancreatitis type [K85.90]   Discharge Diagnosis  Generalized abdominal pain [R10.84] Acute pancreatitis, unspecified complication status, unspecified pancreatitis type [K85.90]    Principal Problem:   Acute on chronic pancreatitis ([place]) Active Problems:   Essential hypertension   Esophageal reflux   History of alcohol abuse   GERD (gastroesophageal reflux disease)   Chest pain   Pancreatitis      Past Medical History:  Diagnosis Date  . Acute pancreatitis 11/23/2015  . Chronic bronchitis ([place])   . Chronic lower back pain   . Congenital single kidney   . GERD (gastroesophageal reflux disease)   . Headache    "once/month" (05/13/2017)  . High cholesterol   . History of kidney stones   . Hypertension   . Migraine    "a couple/year" (05/13/2017)  . Pneumonia ~ 09/2015  . Recurrent acute pancreatitis   . Renal disorder     Past Surgical History:  Procedure Laterality Date  . NO PAST SURGERIES         HPI  from the history and physical done on the day of admission:    Patient coming from:  The patient is coming from  home.  At baseline, pt is independent for most of ADL.   Chief Complaint: Nausea, vomiting, abdominal, pain chest pain  HPI: Kyle Berry is a 47 y.o. male with medical history significant of chronic pancreatitis, alcohol abuse in remission, hypertension, hyperlipidemia, GERD, congenital single kidney, who presents with nausea, vomiting, abdominal pain and chest pain.  Patient states that his abdominal pain started yesterday, which is located in the epigastric area, sharp, 10 out of 10 severity, radiating to bilateral flank area.  He also has nausea and vomited more than 10 times.  He had 1 loose stool bowel movement this morning, currently no diarrhea. Patient states that he has mild substernal chest pain and mild shortness of breath.  He states that each time when he has pancreatitis flareup, he will have mild chest pain or shortness of breath.  He thinks the chest pain is most likely caused by his pancreatitis flareup.  No tenderness in the calf area.  Denies cough, fever or chills.  He states that he has increasing urinary frequency sometimes, but no dysuria or burning on urination.  No hematuria. No unilateral weakness. He states that he stopped  drinking alcohol long time ago.  ED Course: pt was found to have lipase 85, WBC 5.5, electrolytes renal function okay, urinalysis is negative for UTI, but showed positive hemoglobin.  Patient is placed on telemetry bed of observation.  CT-renal stone study: 1. Acute pancreatitis. No abscess. 2. Nonobstructing left renal inferior pole calculi. No hydronephrosis. Atrophic right kidney. 3. Fatty liver. 4. Small cecal and ascending colon diverticula without active inflammatory changes. No bowel obstruction. Normal appendix.       Hospital Course:     Brief summary 47 y.o.malewith medical history significant ofchronic pancreatitis, alcohol abuse in remission, hypertension, hyperlipidemia, GERD, congenital single kidney, who presents  with nausea, vomiting, abdominal pain and chest (Epigastric) pain and admitted on 10/11/2017   Plan:- 1) acute on chronic pancreatitis-   improved abdominal pain, tolerating solid food well, no further emesis, on admission  lipase was 84, c/n Pepcid (he is apparently is intolerant to PPIs), CT abdomen findings of acute pancreatitis and fatty liver noted, Triglycerides less than 350,  c/n Creon, overall improved with IV fluids and bowel rest  2) Atypical chest pain--on further questioning patient describes a sharp epigastric pain rather than chest pain, Ruled out already with for ACS with serial EKG and cardiac enzymes, LDL is 87 with an HDL of 34 . May consider further outpatient work-up.  Patient has remained chest pain-free since admission, suspect GI etiology  3)HTN- stable, continue Cardizem CD 120 mg daily   Code Status : Full code  Discharge Condition: stable for discharge home  Follow UP-- PCP  Diet and Activity recommendation:  As advised  Discharge Instructions    Discharge Instructions    Call MD for:  difficulty breathing, headache or visual disturbances   Complete by:  As directed    Call MD for:  persistant dizziness or light-headedness   Complete by:  As directed    Call MD for:  persistant nausea and vomiting   Complete by:  As directed    Call MD for:  severe uncontrolled pain   Complete by:  As directed    Call MD for:  temperature >100.4   Complete by:  As directed    Diet - low sodium heart healthy   Complete by:  As directed    Discharge instructions   Complete by:  As directed    1)Avoid ibuprofen/Advil/Aleve/Motrin/Goody Powders/Naproxen/BC powders/Meloxicam/Diclofenac/Indomethacin and other Nonsteroidal anti-inflammatory medications as these will make you more likely to bleed and can cause stomach ulcers, can also cause Kidney problems.  2)Avoid Alcohol  3) advance diet as tolerated,  4) follow-up with your primary care doctor within a week for  recheck   Increase activity slowly   Complete by:  As directed         Discharge Medications     Allergies as of 10/14/2017      Reactions   Diclofenac Hives   Norvasc [amlodipine Besylate] Other (See Comments)   Fluid buildup in chest   Omeprazole Other (See Comments)   MD stopped due to pancreatitis   Prednisone Other (See Comments)   Mood swings      Medication List    STOP taking these medications   Ibuprofen-diphenhydrAMINE HCl 200-25 MG Caps     TAKE these medications   diltiazem 120 MG 24 hr capsule Commonly known as:  DILACOR XR Take 1 capsule (120 mg total) by mouth daily.   EYE DROPS OP Apply 2 drops to eye daily.   famotidine 20 MG tablet Commonly  known as:  PEPCID Take 1 tablet (20 mg total) by mouth 2 (two) times daily. What changed:    when to take this  reasons to take this   feeding supplement Liqd Take 1 Container by mouth 3 (three) times daily between meals.   lipase/protease/amylase 36000 UNITS Cpep capsule Commonly known as:  CREON Take 1 capsule (36,000 Units total) by mouth See admin instructions. Take one capsules (36,000 units) by mouth with meals (2 meals daily) and one capsule (36000 units) with snacks daily What changed:    when to take this  additional instructions   promethazine 25 MG tablet Commonly known as:  PHENERGAN Take 1 tablet (25 mg total) by mouth every 6 (six) hours as needed for nausea or vomiting.   traMADol 50 MG tablet Commonly known as:  ULTRAM 1 po q 8 hrs  Prn moderate pain What changed:    how much to take  how to take this  when to take this  reasons to take this  additional instructions       Major procedures and Radiology Reports - PLEASE review detailed and final reports for all details, in brief -   Dg Chest Port 1 View  Result Date: 10/12/2017 CLINICAL DATA:  Chest pain. EXAM: PORTABLE CHEST 1 VIEW COMPARISON:  04/08/2017. FINDINGS: Normal sized heart with normal vascularity. Mild  left basilar atelectasis and minimal right basilar atelectasis. Otherwise, clear lungs. Small left pleural effusion mild scoliosis. IMPRESSION: 1. Mild left basilar atelectasis and minimal right basilar atelectasis. 2. Small left pleural effusion. Electronically Signed   By: Claudie Revering M.D.   On: 10/12/2017 08:10   Ct Renal Stone Study  Result Date: 10/11/2017 CLINICAL DATA:  47 year old male with left-sided flank pain. EXAM: CT ABDOMEN AND PELVIS WITHOUT CONTRAST TECHNIQUE: Multidetector CT imaging of the abdomen and pelvis was performed following the standard protocol without IV contrast. COMPARISON:  Abdominal ultrasound dated 08/25/2017 and CT of abdomen pelvis dated 01/17/2017 FINDINGS: Evaluation of this exam is limited in the absence of intravenous contrast. Lower chest: The visualized lung bases are clear. No intra-abdominal free air or free fluid. Hepatobiliary: There is mild fatty infiltration of the liver. No intrahepatic biliary ductal dilatation. The gallbladder is unremarkable. Pancreas: There is inflammatory changes of the pancreas with peripancreatic stranding most consistent with acute pancreatitis. Correlation with pancreatic enzymes recommended. No fluid collection, abscess, or pseudocyst. Spleen: Normal in size without focal abnormality. Adrenals/Urinary Tract: The adrenal glands are unremarkable. The right kidney is atrophic. A 12 mm hypodense lesion arising from the right kidney is not characterized but appears similar to the CT of 2018, likely a cyst. Nonobstructing left renal inferior pole calculi measure up to 6 mm. There is no hydronephrosis. The left ureter and urinary bladder appear unremarkable. Stomach/Bowel: Multiple small diverticula noted in the ascending colon. There is no bowel obstruction or active inflammation. The appendix is normal. Vascular/Lymphatic: The abdominal aorta and IVC appear unremarkable on this noncontrast CT. No portal venous gas. There is no adenopathy.  Reproductive: The prostate and seminal vesicles are grossly unremarkable. No pelvic mass Other: None Musculoskeletal: Degenerative changes at L5-S1. No acute osseous pathology. IMPRESSION: 1. Acute pancreatitis.  No abscess. 2. Nonobstructing left renal inferior pole calculi. No hydronephrosis. Atrophic right kidney. 3. Fatty liver. 4. Small cecal and ascending colon diverticula without active inflammatory changes. No bowel obstruction. Normal appendix. Electronically Signed   By: Anner Crete M.D.   On: 10/11/2017 21:46    Micro Results  Recent Results (from the past 240 hour(s))  Urine culture     Status: None   Collection Time: 10/11/17  9:56 PM  Result Value Ref Range Status   Specimen Description URINE, CLEAN CATCH  Final   Special Requests NONE  Final   Culture   Final    NO GROWTH Performed at [facility], [address] [place], [place] [address]    Report Status 10/13/2017 FINAL  Final       Today   Subjective    Rasheem Figiel today has no new complaints, eating and drinking well          Patient has been seen and examined prior to discharge   Objective   Blood pressure 129/88, pulse 78, temperature 98.7 F (37.1 C), resp. rate 18, height 6\' 1"  (1.854 m), weight 81.2 kg (179 lb), SpO2 98 %.   Intake/Output Summary (Last 24 hours) at 10/14/2017 1659 Last data filed at 10/14/2017 1100 Gross per 24 hour  Intake 3643.15 ml  Output 3350 ml  Net 293.15 ml    Exam  Gen:- Awake Alert,  In no apparent distress  HEENT:- [place].AT, No sclera icterus Neck-Supple Neck,No JVD,.  Lungs-  CTAB , good air movement CV- S1, S2 normal, regular Abd-  +ve B.Sounds, Abd Soft, much Less tender epigastric and left upper quadrant, no rebound or guarding Extremity/Skin:- No  edema,   Good pulses Psych-affect is appropriate, oriented x3 Neuro-no new focal deficits, no tremors     Data Review   CBC w Diff:  Lab Results  Component Value Date   WBC 5.8 10/14/2017     HGB 11.2 (L) 10/14/2017   HCT 34.4 (L) 10/14/2017   PLT 165 10/14/2017   LYMPHOPCT 45 05/16/2017   MONOPCT 5 05/16/2017   EOSPCT 2 05/16/2017   BASOPCT 0 05/16/2017    CMP:  Lab Results  Component Value Date   NA 135 10/14/2017   K 3.5 10/14/2017   CL 103 10/14/2017   CO2 26 10/14/2017   BUN <5 (L) 10/14/2017   CREATININE 1.06 10/14/2017   PROT 5.5 (L) 10/14/2017   ALBUMIN 2.6 (L) 10/14/2017   BILITOT 1.3 (H) 10/14/2017   ALKPHOS 40 10/14/2017   AST 20 10/14/2017   ALT 15 10/14/2017  .   Total Discharge time is about 33 minutes  Roxan Hockey M.D on 10/14/2017 at 4:59 PM  [facility]   Office  [phone]  Voice Recognition Viviann Spare dictation system was used to create this note, attempts have been made to correct errors. Please contact the author with questions and/or clarifications.

## 2017-10-18 ENCOUNTER — Other Ambulatory Visit: Payer: Self-pay

## 2017-10-18 ENCOUNTER — Inpatient Hospital Stay (HOSPITAL_COMMUNITY)
Admission: EM | Admit: 2017-10-18 | Discharge: 2017-10-24 | DRG: 417 | Disposition: A | Discharge status: Home / Self Care | Payer: Managed Care, Other (non HMO) | Attending: Surgery | Admitting: Surgery

## 2017-10-18 ENCOUNTER — Encounter (HOSPITAL_COMMUNITY): Payer: Self-pay

## 2017-10-18 ENCOUNTER — Encounter: Payer: Self-pay | Admitting: Nurse Practitioner

## 2017-10-18 ENCOUNTER — Ambulatory Visit (INDEPENDENT_AMBULATORY_CARE_PROVIDER_SITE_OTHER): Payer: Managed Care, Other (non HMO) | Admitting: Nurse Practitioner

## 2017-10-18 ENCOUNTER — Emergency Department (HOSPITAL_COMMUNITY): Payer: Managed Care, Other (non HMO)

## 2017-10-18 VITALS — BP 136/88 | HR 82 | Ht 73.0 in | Wt 175.1 lb

## 2017-10-18 DIAGNOSIS — Q600 Renal agenesis, unilateral: Secondary | ICD-10-CM

## 2017-10-18 DIAGNOSIS — K219 Gastro-esophageal reflux disease without esophagitis: Secondary | ICD-10-CM | POA: Diagnosis present

## 2017-10-18 DIAGNOSIS — K920 Hematemesis: Secondary | ICD-10-CM

## 2017-10-18 DIAGNOSIS — F1011 Alcohol abuse, in remission: Secondary | ICD-10-CM | POA: Diagnosis present

## 2017-10-18 DIAGNOSIS — K859 Acute pancreatitis without necrosis or infection, unspecified: Secondary | ICD-10-CM | POA: Diagnosis present

## 2017-10-18 DIAGNOSIS — Q453 Other congenital malformations of pancreas and pancreatic duct: Secondary | ICD-10-CM | POA: Diagnosis not present

## 2017-10-18 DIAGNOSIS — R079 Chest pain, unspecified: Secondary | ICD-10-CM | POA: Diagnosis present

## 2017-10-18 DIAGNOSIS — K8681 Exocrine pancreatic insufficiency: Secondary | ICD-10-CM | POA: Diagnosis present

## 2017-10-18 DIAGNOSIS — I10 Essential (primary) hypertension: Secondary | ICD-10-CM | POA: Diagnosis present

## 2017-10-18 DIAGNOSIS — Z6822 Body mass index (BMI) 22.0-22.9, adult: Secondary | ICD-10-CM | POA: Diagnosis not present

## 2017-10-18 DIAGNOSIS — R112 Nausea with vomiting, unspecified: Secondary | ICD-10-CM | POA: Diagnosis not present

## 2017-10-18 DIAGNOSIS — E785 Hyperlipidemia, unspecified: Secondary | ICD-10-CM | POA: Diagnosis present

## 2017-10-18 DIAGNOSIS — Z8719 Personal history of other diseases of the digestive system: Secondary | ICD-10-CM | POA: Diagnosis not present

## 2017-10-18 DIAGNOSIS — K226 Gastro-esophageal laceration-hemorrhage syndrome: Secondary | ICD-10-CM | POA: Diagnosis present

## 2017-10-18 DIAGNOSIS — K861 Other chronic pancreatitis: Secondary | ICD-10-CM | POA: Diagnosis present

## 2017-10-18 DIAGNOSIS — E440 Moderate protein-calorie malnutrition: Secondary | ICD-10-CM

## 2017-10-18 HISTORY — DX: Hematemesis: K92.0

## 2017-10-18 LAB — COMPREHENSIVE METABOLIC PANEL
ALK PHOS: 53 U/L (ref 38–126)
ALT: 62 U/L — AB (ref 0–44)
AST: 64 U/L — ABNORMAL HIGH (ref 15–41)
Albumin: 3.7 g/dL (ref 3.5–5.0)
Anion gap: 9 (ref 5–15)
BUN: 12 mg/dL (ref 6–20)
CALCIUM: 9.1 mg/dL (ref 8.9–10.3)
CO2: 26 mmol/L (ref 22–32)
CREATININE: 1.22 mg/dL (ref 0.61–1.24)
Chloride: 103 mmol/L (ref 98–111)
Glucose, Bld: 95 mg/dL (ref 70–99)
Potassium: 4.6 mmol/L (ref 3.5–5.1)
Sodium: 138 mmol/L (ref 135–145)
Total Bilirubin: 1.3 mg/dL — ABNORMAL HIGH (ref 0.3–1.2)
Total Protein: 7 g/dL (ref 6.5–8.1)

## 2017-10-18 LAB — URINALYSIS, ROUTINE W REFLEX MICROSCOPIC
Bilirubin Urine: NEGATIVE
Glucose, UA: NEGATIVE mg/dL
Hgb urine dipstick: NEGATIVE
KETONES UR: NEGATIVE mg/dL
LEUKOCYTES UA: NEGATIVE
Nitrite: NEGATIVE
PROTEIN: NEGATIVE mg/dL
Specific Gravity, Urine: 1.01 (ref 1.005–1.030)
pH: 6 (ref 5.0–8.0)

## 2017-10-18 LAB — CBC
HCT: 38.7 % — ABNORMAL LOW (ref 39.0–52.0)
HEMATOCRIT: 36.2 % — AB (ref 39.0–52.0)
Hemoglobin: 12.4 g/dL — ABNORMAL LOW (ref 13.0–17.0)
Hemoglobin: 11.6 g/dL — ABNORMAL LOW (ref 13.0–17.0)
MCH: 32.5 pg (ref 26.0–34.0)
MCH: 32.9 pg (ref 26.0–34.0)
MCHC: 32 g/dL (ref 30.0–36.0)
MCHC: 32 g/dL (ref 30.0–36.0)
MCV: 101.3 fL — ABNORMAL HIGH (ref 78.0–100.0)
MCV: 102.5 fL — AB (ref 78.0–100.0)
PLATELETS: 253 10*3/uL (ref 150–400)
PLATELETS: 226 10*3/uL (ref 150–400)
RBC: 3.82 MIL/uL — AB (ref 4.22–5.81)
RBC: 3.53 MIL/uL — AB (ref 4.22–5.81)
RDW: 13.9 % (ref 11.5–15.5)
RDW: 13.8 % (ref 11.5–15.5)
WBC: 6 10*3/uL (ref 4.0–10.5)
WBC: 6 10*3/uL (ref 4.0–10.5)

## 2017-10-18 LAB — LIPASE, BLOOD: LIPASE: 76 U/L — AB (ref 11–51)

## 2017-10-18 MED ORDER — FAMOTIDINE IN NACL 20-0.9 MG/50ML-% IV SOLN
20.0000 mg | Freq: Two times a day (BID) | INTRAVENOUS | Status: DC
  Administered 2017-10-18 – 2017-10-20 (×5): 20 mg via INTRAVENOUS
  Filled 2017-10-18 (×5): qty 50

## 2017-10-18 MED ORDER — ONDANSETRON HCL 4 MG PO TABS: 4.0000 mg | ORAL_TABLET | Freq: Four times a day (QID) | ORAL | Status: DC | PRN

## 2017-10-18 MED ORDER — DILTIAZEM HCL ER COATED BEADS 120 MG PO CP24
120.0000 mg | ORAL_CAPSULE | Freq: Every day | ORAL | Status: DC
  Administered 2017-10-19 – 2017-10-22 (×4): 120 mg via ORAL
  Filled 2017-10-18 (×5): qty 1

## 2017-10-18 MED ORDER — ONDANSETRON HCL 4 MG/2ML IJ SOLN
4.0000 mg | Freq: Once | INTRAMUSCULAR | Status: AC
  Administered 2017-10-18: 4 mg via INTRAVENOUS
  Filled 2017-10-18: qty 2

## 2017-10-18 MED ORDER — ONDANSETRON HCL 4 MG/2ML IJ SOLN
4.0000 mg | Freq: Four times a day (QID) | INTRAMUSCULAR | Status: DC | PRN
  Administered 2017-10-18 – 2017-10-22 (×7): 4 mg via INTRAVENOUS
  Filled 2017-10-18 (×7): qty 2

## 2017-10-18 MED ORDER — LACTATED RINGERS IV BOLUS
2000.0000 mL | Freq: Once | INTRAVENOUS | Status: AC
  Administered 2017-10-18: 2000 mL via INTRAVENOUS

## 2017-10-18 MED ORDER — SODIUM CHLORIDE 0.9 % IV SOLN
INTRAVENOUS | Status: AC
  Administered 2017-10-18 – 2017-10-19 (×2): via INTRAVENOUS

## 2017-10-18 MED ORDER — TRAMADOL HCL 50 MG PO TABS
50.0000 mg | ORAL_TABLET | Freq: Three times a day (TID) | ORAL | Status: DC | PRN
  Administered 2017-10-18 – 2017-10-20 (×2): 50 mg via ORAL
  Filled 2017-10-18 (×2): qty 1

## 2017-10-18 MED ORDER — ACETAMINOPHEN 325 MG PO TABS: 650.0000 mg | ORAL_TABLET | Freq: Four times a day (QID) | ORAL | Status: DC | PRN

## 2017-10-18 MED ORDER — PANCRELIPASE (LIP-PROT-AMYL) 12000-38000 UNITS PO CPEP: 36000.0000 [IU] | ORAL_CAPSULE | Freq: Two times a day (BID) | ORAL | Status: DC | PRN

## 2017-10-18 MED ORDER — MORPHINE SULFATE (PF) 4 MG/ML IV SOLN
6.0000 mg | Freq: Once | INTRAVENOUS | Status: AC
  Administered 2017-10-18: 6 mg via INTRAVENOUS
  Filled 2017-10-18: qty 2

## 2017-10-18 MED ORDER — PANCRELIPASE (LIP-PROT-AMYL) 12000-38000 UNITS PO CPEP
36000.0000 [IU] | ORAL_CAPSULE | Freq: Three times a day (TID) | ORAL | Status: DC
  Administered 2017-10-19 – 2017-10-22 (×12): 36000 [IU] via ORAL
  Filled 2017-10-18 (×12): qty 3

## 2017-10-18 MED ORDER — ACETAMINOPHEN 650 MG RE SUPP: 650.0000 mg | Freq: Four times a day (QID) | RECTAL | Status: DC | PRN

## 2017-10-18 MED ORDER — MORPHINE SULFATE (PF) 4 MG/ML IV SOLN
4.0000 mg | Freq: Once | INTRAVENOUS | Status: AC
  Administered 2017-10-18: 4 mg via INTRAVENOUS
  Filled 2017-10-18: qty 1

## 2017-10-18 NOTE — ED Notes (Signed)
Patient transported to X-ray 

## 2017-10-18 NOTE — ED Triage Notes (Signed)
Pt states that he has been vomiting blood since Thursday. Pt was released from hospital on Monday for pancreatitis. Pt states he has has 7 episodes of vomiting bright and dark blood since Thursday. Saw GI today who recommended that he come here for follow up.

## 2017-10-18 NOTE — ED Provider Notes (Signed)
[facility] EMERGENCY DEPARTMENT Provider Note   CSN: [id number] Arrival date & time: 10/18/17  1528  History   Chief Complaint Chief Complaint  Patient presents with  . Hematemesis    HPI Kyle Berry is a 47 y.o. male.  HPI Patient is a 47 year old male history of prior alcohol abuse in remission and recurrent pancreatitis presenting to the ED for epigastric abdominal pain. He was discharged from the hospital 4 days ago after admission for the symptoms. Since then, has had gradually worsening recurrence of his pain with persistent vomiting and failure by mouth intake. Since last night, he has been vomiting with bright red blood mixed in with vomitus. No melena or hematochezia. No diarrhea. No dysuria. No chest pain or dyspnea. No other complaints. Pain is constant and severe. No relieving factors at home.  Past Medical History:  Diagnosis Date  . Acute pancreatitis 11/23/2015  . Chronic bronchitis ([place])   . Chronic lower back pain   . Congenital single kidney   . GERD (gastroesophageal reflux disease)   . Headache    "once/month" (05/13/2017)  . High cholesterol   . History of kidney stones   . Hypertension   . Migraine    "a couple/year" (05/13/2017)  . Pneumonia ~ 09/2015  . Recurrent acute pancreatitis   . Renal disorder     Patient Active Problem List   Diagnosis Date Noted  . Pancreatitis 10/12/2017  . Generalized abdominal pain   . Chest pain 10/11/2017  . Pancreatitis, recurrent 08/25/2017  . Recurrent pancreatitis 05/13/2017  . GERD (gastroesophageal reflux disease) 05/13/2017  . Congenital single kidney 05/13/2017  . High cholesterol 05/13/2017  . Abnormal urinalysis 05/13/2017  . Nephrolithiasis 05/13/2017  . Acute on chronic pancreatitis ([place]) 01/17/2017  . Acute recurrent pancreatitis   . History of alcohol abuse   . Hypokalemia   . Non-intractable vomiting with nausea   . Essential hypertension   . Esophageal reflux   .  Chronic pancreatitis ([place]) 11/23/2015    Past Surgical History:  Procedure Laterality Date  . NO PAST SURGERIES          Home Medications    Prior to Admission medications   Medication Sig Start Date End Date Taking? Authorizing Provider  Carboxymethylcellulose Sodium (EYE DROPS OP) Apply 2 drops to eye daily.    [provider]  diltiazem (DILACOR XR) 120 MG 24 hr capsule Take 1 capsule (120 mg total) by mouth daily. 10/14/17   Roxan Hockey, MD  famotidine (PEPCID) 20 MG tablet Take 1 tablet (20 mg total) by mouth 2 (two) times daily. 10/14/17   Roxan Hockey, MD  feeding supplement (BOOST / RESOURCE BREEZE) LIQD Take 1 Container by mouth 3 (three) times daily between meals. 01/20/17   Mikhail, Velta Addison, DO  lipase/protease/amylase (CREON) 36000 UNITS CPEP capsule Take 1 capsule (36,000 Units total) by mouth See admin instructions. Take one capsules (36,000 units) by mouth with meals (2 meals daily) and one capsule (36000 units) with snacks daily 10/14/17   Roxan Hockey, MD  promethazine (PHENERGAN) 25 MG tablet Take 1 tablet (25 mg total) by mouth every 6 (six) hours as needed for nausea or vomiting. 10/14/17   Roxan Hockey, MD  traMADol (ULTRAM) 50 MG tablet 1 po q 8 hrs  Prn moderate pain 10/14/17   Roxan Hockey, MD    Family History Family History  Problem Relation Age of Onset  . Hypertension Other   . Hypertension Mother   . Hypertension  Father   . Kidney disease Father   . Hypertension Sister   . Diabetes Sister   . Hypertension Brother     Social History Social History   Tobacco Use  . Smoking status: Never Smoker  . Smokeless tobacco: Never Used  Substance Use Topics  . Alcohol use: Not Currently    Comment: 05/13/2017 "might have beer a few times/year"  . Drug use: No     Allergies   Diclofenac; Norvasc [amlodipine besylate]; Omeprazole; and Prednisone   Review of Systems Review of Systems  Constitutional: Negative for fever.  HENT:  Negative for congestion.   Eyes: Negative for visual disturbance.  Respiratory: Negative for cough and shortness of breath.   Cardiovascular: Negative for chest pain.  Gastrointestinal: Positive for abdominal pain and vomiting. Negative for blood in stool and diarrhea.  Genitourinary: Negative for dysuria.  Musculoskeletal: Negative for back pain.  Skin: Negative for rash.  Neurological: Negative for headaches.  All other systems reviewed and are negative.    Physical Exam Updated Vital Signs BP (!) 133/99 (BP Location: Right Arm)   Pulse 91   Temp 98.4 F (36.9 C) (Oral)   Resp 16   Ht 6\' 1"  (1.854 m)   Wt 80.7 kg (178 lb)   SpO2 99%   BMI 23.48 kg/m   Physical Exam  Constitutional: He is oriented to person, place, and time. No distress.  Appears comfortable  HENT:  Head: Normocephalic and atraumatic.  Mouth/Throat: Oropharynx is clear and moist. Mucous membranes are dry.  Eyes: Pupils are equal, round, and reactive to light.  Neck: Neck supple. No JVD present.  Cardiovascular: Normal rate, regular rhythm, normal heart sounds and intact distal pulses.  No murmur heard. Pulmonary/Chest: Breath sounds normal. No respiratory distress. He has no wheezes. He has no rales.  Abdominal: Soft. He exhibits no distension and no mass. There is tenderness (moderate, epigastric). There is no guarding.  Musculoskeletal: Normal range of motion. He exhibits no edema.  Neurological: He is alert and oriented to person, place, and time.  Skin: Skin is warm and dry.  Psychiatric: He has a normal mood and affect. His behavior is normal.  Nursing note and vitals reviewed.    ED Treatments / Results  Labs (all labs ordered are listed, but only abnormal results are displayed) Labs Reviewed  LIPASE, BLOOD  COMPREHENSIVE METABOLIC PANEL  CBC  URINALYSIS, ROUTINE W REFLEX MICROSCOPIC    EKG None  Radiology Dg Chest 2 View  Result Date: 10/18/2017 CLINICAL DATA:  Hematemesis since  yesterday. EXAM: CHEST - 2 VIEW COMPARISON:  PA and lateral chest 04/08/2017. Single-view of the chest 10/12/2017. FINDINGS: The lungs are clear. Heart size is normal. No pneumothorax or pleural effusion. No focal bony abnormality IMPRESSION: Negative chest. Electronically Signed   By: Inge Rise M.D.   On: 10/18/2017 18:33    Procedures Procedures (including critical care time)  Medications Ordered in ED Medications - No data to display   Initial Impression / Assessment and Plan / ED Course  I have reviewed the triage vital signs and the nursing notes.  Pertinent labs & imaging results that were available during my care of the patient were reviewed by me and considered in my medical decision making (see chart for details).  Differential includes recurrent pancreatitis, acute-on-chronic pancreatitis, and GERD. Clinical findings suggestive of pancreatitis. Lipase is mildly elevated, consistent with prior admissions. Cholecystitis less likely based on H&P. Regarding hematemesis, suspect Mallory-Weiss tear. CXR shows no pneumomediastinum.  No report of melena or hematochezia. Hemoglobin stable. Doubt [place] in absence of sepsis or more severe presentation.   Patient given IV fluids and analgesics. Admitted to hospitalist for further management. GI on-call for [facility] notified at hospitalist request who will see patient in the morning.  Final Clinical Impressions(s) / ED Diagnoses   Final diagnoses:  Hematemesis     Prescilla Sours, MD 10/19/17 0109    Elnora Morrison, MD 10/19/17 [phone]

## 2017-10-18 NOTE — Progress Notes (Signed)
      IMPRESSION and PLAN:    #22.  47 year old male with recurrent acute pancreatitis . Multiple hospital admissions over the last 2 to 3 years. ETOH likely contributing to initial episode, then possibly ACEI but none of either in months and still with recurring episodes of acute pancreatitis.  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 Discharged from hospital 4 days ago, here with worsening abdominal pain, N/V and hematemesis. Vital stable, no dizziness.  -Failing outpatient management and now with new hematemesis. Sending to ED. I spoke with ED Triage and our inpatient GI doc.  #2.  Hematemesis, started Thursday. Seven episodes of hematemesis since onset. Blood dark and bright red, large volume this am he says. Possibly Mallory Weiss tear from vomiting.       HPI:    Chief Complaint: pancreatitis and vomiting blood   Patient is a 47 yo male known to Dr. Silverio Decamp. He has a hx of recurrent acute pancreatitis (?acute on chronic)  requiring several hospital admissions over last two years.   The first episode was in 2017 and probably ETOH related. Subsequent episodes were in absence of alcohol but possibly ACEI related.  Liver chemistries have been normal except for mildly elevated total bilirubin. MRCP negative for biliary source but has suggested pancreatic divisum with some debris in, and focal dilation of, dorsal duct. Pancreatic duct diffusely mildly irregularly dilated up to 4 mm raising concern for chronic pancreatitis.   Reggie was readmitted a few days ago with recurrent acute pancreatitis. He hasn't consumed any ETOH in months. Not on ACEI. Discharged home a three of days ago but feels worse now than when admitted. He has constant upper abdominal pain, nausea / vomiting and yesterday began vomiting bright and dark red blood. Unable to tolerated any PO. No blood in stool. No fevers   Review of systems:     No chest pain, no SOB, no fevers, no  urinary sx   Past Medical History:  Diagnosis Date  . Acute pancreatitis 11/23/2015  . Chronic bronchitis ([place])   . Chronic lower back pain   . Congenital single kidney   . GERD (gastroesophageal reflux disease)   . Headache    "once/month" (05/13/2017)  . High cholesterol   . History of kidney stones   . Hypertension   . Migraine    "a couple/year" (05/13/2017)  . Pneumonia ~ 09/2015  . Recurrent acute pancreatitis   . Renal disorder     Patient's surgical history, family medical history, social history, medications and allergies were all reviewed in Epic   Serum creatinine: 1.06 mg/dL 10/14/17 0617 Estimated creatinine clearance: 95.7 mL/min   Physical Exam:     BP 136/88   Pulse 82   Ht 6\' 1"  (1.854 m)   Wt 175 lb 2 oz (79.4 kg)   BMI 23.10 kg/m   GENERAL:  Pleasant male in NAD PSYCH: : Cooperative, normal affect EENT:  conjunctiva pink, mucous membranes moist, neck supple without masses CARDIAC:  RRR, no murmur heard, no peripheral edema PULM: Normal respiratory effort, lungs CTA bilaterally, no wheezing ABDOMEN:  Nondistended, soft, moderate epigastric tenderness. No obvious masses, no hepatomegaly,  normal bowel sounds SKIN:  turgor, no lesions seen Musculoskeletal:  Normal muscle tone, normal strength NEURO: Alert and oriented x 3, no focal neurologic deficits   Tye Savoy , NP 10/18/2017, 2:33 PM

## 2017-10-18 NOTE — H&P (Signed)
Kyle Berry TKW:[id number] DOB: 1970-11-29 DOA: 10/18/2017     PCP: Kerin Perna, NP   Outpatient Specialists:     GI: [facility] Patient arrived to ER on 10/18/17 at 1528  Patient coming from:    home Lives  With family    Chief Complaint:  Chief Complaint  Patient presents with  . Hematemesis    HPI: Kyle Berry is a 47 y.o. male with medical history significant of 47 y.o. male with medical history significant of chronic pancreatitis, alcohol abuse in remission, hypertension, hyperlipidemia, GERD, congenital single kidney, who presents with nausea, vomiting, abdominal pain and chest pain   Presented with   Hematemesis since yesterday, 2 episodes this AM and now have stopped.  Had recurrent hospitalization the past few years. Felt that initially was pancreatitis secondary to alcohol abuse but he stopped drinking.  He had had extensive GI work-up including MRCP that showed no evidence of biliary etiology.  He was just discharged from the hospital 4 days ago when he was hospitalized for pancreatitis exacerbation.  Unfortunately his pain continued to get worse today tios  is typical chronic pancreatitis pain in the upper abdomen associated nausea and vomiting after vomiting profusely he did develop hematemesis he has not been lightheaded. Went to his GI doctor today who felt that patient has failed outpatient treatment and given persistent hematemesis will need to present to emergency department.  GI have suspect that hematemesis was likely secondary to Mallory-Weiss tear from vomiting.  Patient describes total of 7 episodes of hematemesis since yesterday associated with dark blood as well as some bright red blood.  Denies any melena or blood in stool.  No associated diarrhea no chest pain or shortness of breath no lightheadedness Currently appears to be stable  CT of abdomen done on 28 June showed inflammatory change of the pancreas with peripancreatic stranding Regarding  pertinent Chronic problems: reCurrent admissions for pancreatitis History of hypertension for which she is on diltiazem While in ER:   Following Medications were ordered in ER: Medications  lactated ringers bolus 2,000 mL (2,000 mLs Intravenous New Bag/Given 10/18/17 1904)  ondansetron (ZOFRAN) injection 4 mg (4 mg Intravenous Given 10/18/17 1858)  morphine 4 MG/ML injection 6 mg (6 mg Intravenous Given 10/18/17 1859)    Significant initial  Findings: Abnormal Labs Reviewed  LIPASE, BLOOD - Abnormal; Notable for the following components:      Result Value   Lipase 76 (*)    All other components within normal limits  COMPREHENSIVE METABOLIC PANEL - Abnormal; Notable for the following components:   AST 64 (*)    ALT 62 (*)    Total Bilirubin 1.3 (*)    All other components within normal limits  CBC - Abnormal; Notable for the following components:   RBC 3.82 (*)    Hemoglobin 12.4 (*)    HCT 38.7 (*)    MCV 101.3 (*)    All other components within normal limits  CBC - Abnormal; Notable for the following components:   RBC 3.53 (*)    Hemoglobin 11.6 (*)    HCT 36.2 (*)    MCV 102.5 (*)    All other components within normal limits   Lipase 76  Na 138 K 4.6  Cr    Up from baseline see below Lab Results  Component Value Date   CREATININE 1.22 10/18/2017   CREATININE 1.06 10/14/2017   CREATININE 1.01 10/12/2017      WBC  6.0  HG/HCT   stable,      Component Value Date/Time   HGB 11.6 (L) 10/18/2017 2242   HCT 36.2 (L) 10/18/2017 2242  4 days ago was 11.2       UA no evidence of UTI     CXR - NON acute  ECG:  Not ordered     ED Triage Vitals  Enc Vitals Group     BP 10/18/17 1553 (!) 133/99     Pulse Rate 10/18/17 1553 91     Resp 10/18/17 1553 16     Temp 10/18/17 1553 98.4 F (36.9 C)     Temp Source 10/18/17 1553 Oral     SpO2 10/18/17 1553 99 %     Weight 10/18/17 1552 178 lb (80.7 kg)     Height 10/18/17 1552 6\' 1"  (1.854 m)     Head Circumference  --      Peak Flow --      Pain Score 10/18/17 1559 8     Pain Loc --      Pain Edu? --      Excl. in [place]? --   TMAX(24)@       Latest  Blood pressure (!) 142/99, pulse 78, temperature 98.4 F (36.9 C), temperature source Oral, resp. rate 16, height 6\' 1"  (1.854 m), weight 80.7 kg (178 lb), SpO2 100 %.    ER Provider Called:   [facility]   Will see in AM    Hospitalist was called for admission for chronic pancreatitis exacerbation and hematemesis suspect secondary to Mallory-Weiss tear   Review of Systems:    Pertinent positives include:  fatigue, abdominal pain, nausea, vomiting  hematemesis Constitutional:  No weight loss, night sweats, Fevers, chills weight loss  HEENT:  No headaches, Difficulty swallowing,Tooth/dental problems,Sore throat,  No sneezing, itching, ear ache, nasal congestion, post nasal drip,  Cardio-vascular:  No chest pain, Orthopnea, PND, anasarca, dizziness, palpitations.no Bilateral lower extremity swelling  GI:  No heartburn, indigestion,, diarrhea, change in bowel habits, loss of appetite, melena, blood in stool, Resp:  no shortness of breath at rest. No dyspnea on exertion, No excess mucus, no productive cough, No non-productive cough, No coughing up of blood.No change in color of mucus.No wheezing. Skin:  no rash or lesions. No jaundice GU:  no dysuria, change in color of urine, no urgency or frequency. No straining to urinate.  No flank pain.  Musculoskeletal:  No joint pain or no joint swelling. No decreased range of motion. No back pain.  Psych:  No change in mood or affect. No depression or anxiety. No memory loss.  Neuro: no localizing neurological complaints, no tingling, no weakness, no double vision, no gait abnormality, no slurred speech, no confusion  As per HPI otherwise 10 point review of systems negative.   Past Medical History:   Past Medical History:  Diagnosis Date  . Acute pancreatitis 11/23/2015  . Chronic bronchitis ([place])     . Chronic lower back pain   . Congenital single kidney   . GERD (gastroesophageal reflux disease)   . Headache    "once/month" (05/13/2017)  . High cholesterol   . History of kidney stones   . Hypertension   . Migraine    "a couple/year" (05/13/2017)  . Pneumonia ~ 09/2015  . Recurrent acute pancreatitis   . Renal disorder       Past Surgical History:  Procedure Laterality Date  . NO PAST SURGERIES      Social  History:  Ambulatory independently       reports that he has never smoked. He has never used smokeless tobacco. He reports that he drank alcohol. He reports that he does not use drugs.     Family History:   Family History  Problem Relation Age of Onset  . Hypertension Other   . Hypertension Mother   . Hypertension Father   . Kidney disease Father   . Hypertension Sister   . Diabetes Sister   . Hypertension Brother     Allergies: Allergies  Allergen Reactions  . Diclofenac Hives  . Norvasc [Amlodipine Besylate] Other (See Comments)    Fluid buildup in chest  . Omeprazole Other (See Comments)    MD stopped due to pancreatitis  . Prednisone Other (See Comments)    Mood swings     Prior to Admission medications   Medication Sig Start Date End Date Taking? Authorizing Provider  albuterol (PROVENTIL HFA;VENTOLIN HFA) 108 (90 Base) MCG/ACT inhaler Inhale 1-2 puffs into the lungs every 4 (four) hours as needed for wheezing or shortness of breath.   Yes [provider]  Carboxymethylcellulose Sodium (EYE DROPS OP) Place 2 drops into both eyes daily.    Yes [provider]  diltiazem (DILACOR XR) 120 MG 24 hr capsule Take 1 capsule (120 mg total) by mouth daily. 10/14/17  Yes Emokpae, [name], MD  famotidine (PEPCID) 20 MG tablet Take 1 tablet (20 mg total) by mouth 2 (two) times daily. Patient taking differently: Take 20 mg by mouth 2 (two) times daily as needed for heartburn or indigestion.  10/14/17  Yes Emokpae, [name], MD  feeding  supplement (BOOST / RESOURCE BREEZE) LIQD Take 1 Container by mouth 3 (three) times daily between meals. 01/20/17  Yes Mikhail, Velta Addison, DO  lipase/protease/amylase (CREON) 36000 UNITS CPEP capsule Take 1 capsule (36,000 Units total) by mouth See admin instructions. Take one capsules (36,000 units) by mouth with meals (2 meals daily) and one capsule (36000 units) with snacks daily 10/14/17  Yes Emokpae, [name], MD  promethazine (PHENERGAN) 25 MG tablet Take 1 tablet (25 mg total) by mouth every 6 (six) hours as needed for nausea or vomiting. 10/14/17  Yes Emokpae, [name], MD  traMADol (ULTRAM) 50 MG tablet 1 po q 8 hrs  Prn moderate pain Patient taking differently: Take 50 mg by mouth every 8 (eight) hours as needed for moderate pain.  10/14/17  Yes Roxan Hockey, MD   Physical Exam: Blood pressure (!) 142/99, pulse 78, temperature 98.4 F (36.9 C), temperature source Oral, resp. rate 16, height 6\' 1"  (1.854 m), weight 80.7 kg (178 lb), SpO2 100 %. 1. General:  in No Acute distress  toxic acutely ill -appearing 2. Psychological: Alert and   Oriented 3. Head/ENT:     Dry Mucous Membranes                          Head Non traumatic, neck supple                            Poor Dentition 4. SKIN:  decreased Skin turgor,  Skin clean Dry and intact no rash 5. Heart: Regular rate and rhythm no  Murmur, no Rub or gallop 6. Lungs: no wheezes or crackles   7. Abdomen: Soft,  Epigastric tenderness, Non distended  bowel sounds present 8. Lower extremities: no clubbing, cyanosis, or edema 9. Neurologically Grossly intact, moving  all 4 extremities equally   10. MSK: Normal range of motion   LABS:     Recent Labs  Lab 10/12/17 0505 10/14/17 0617 10/18/17 1633  WBC 8.4 5.8 6.0  HGB 13.2 11.2* 12.4*  HCT 39.9 34.4* 38.7*  MCV 98.8 100.3* 101.3*  PLT 183 165 060   Basic Metabolic Panel: Recent Labs  Lab 10/12/17 0505 10/14/17 0617 10/18/17 1633  NA 139 135 138  K 3.4* 3.5 4.6  CL 107 103  103  CO2 25 26 26   GLUCOSE 120* 88 95  BUN <5* <5* 12  CREATININE 1.01 1.06 1.22  CALCIUM 8.3* 8.0* 9.1      Recent Labs  Lab 10/14/17 0617 10/18/17 1633  AST 20 64*  ALT 15 62*  ALKPHOS 40 53  BILITOT 1.3* 1.3*  PROT 5.5* 7.0  ALBUMIN 2.6* 3.7   Recent Labs  Lab 10/18/17 1633  LIPASE 76*   No results for input(s): AMMONIA in the last 168 hours.    HbA1C: No results for input(s): HGBA1C in the last 72 hours. CBG: Recent Labs  Lab 10/12/17 0804 10/13/17 0808 10/14/17 0736  GLUCAP 109* 91 85      Urine analysis:    Component Value Date/Time   COLORURINE AMBER (A) 10/11/2017 2156   APPEARANCEUR HAZY (A) 10/11/2017 2156   LABSPEC 1.029 10/11/2017 2156   PHURINE 5.0 10/11/2017 2156   GLUCOSEU NEGATIVE 10/11/2017 2156   HGBUR MODERATE (A) 10/11/2017 2156   BILIRUBINUR NEGATIVE 10/11/2017 2156   KETONESUR 20 (A) 10/11/2017 2156   PROTEINUR 30 (A) 10/11/2017 2156   UROBILINOGEN 0.2 01/20/2015 1643   NITRITE NEGATIVE 10/11/2017 2156   LEUKOCYTESUR NEGATIVE 10/11/2017 2156       Cultures:    Component Value Date/Time   SDES URINE, CLEAN CATCH 10/11/2017 2156   [place] NONE 10/11/2017 2156   CULT  10/11/2017 2156    NO GROWTH Performed at [facility] [address] [place], [place] [address]    REPTSTATUS 10/13/2017 FINAL 10/11/2017 2156     Radiological Exams on Admission: Dg Chest 2 View  Result Date: 10/18/2017 CLINICAL DATA:  Hematemesis since yesterday. EXAM: CHEST - 2 VIEW COMPARISON:  PA and lateral chest 04/08/2017. Single-view of the chest 10/12/2017. FINDINGS: The lungs are clear. Heart size is normal. No pneumothorax or pleural effusion. No focal bony abnormality IMPRESSION: Negative chest. Electronically Signed   By: Inge Rise M.D.   On: 10/18/2017 18:33    Chart has been reviewed    Assessment/Plan   47 y.o. male with medical history significant of 47 y.o. male with medical history significant of chronic  pancreatitis, alcohol abuse in remission, hypertension, hyperlipidemia, GERD, congenital single kidney, who presents with nausea, vomiting, abdominal pain and chest pain Admitted for chronic pancreatitis exacerbation and hematemesis suspect secondary to Mallory-Weiss tear  Present on Admission: . Acute on chronic pancreatitis ([place]) -admit for IV fluid rehydration conservative management keep n.p.o. appreciate GI consult  . Essential hypertension -allow some permissive hypertension for the day while n.p.o. . Hematemesis -suspect secondary to Mallory-Weiss tear make sure on Pepcid twice daily monitor serial CBC appreciate GI input hemoglobin has been stable Hematemesis have resolved.    Other plan as per orders.  DVT prophylaxis:  SCD   Code Status:  FULL CODE  as per patient  I had personally discussed CODE STATUS with patient   Family Communication:   Family not  at  Bedside    Disposition Plan:  To home once workup is complete and patient is stable   Consults called: [facility]   Admission status:   inpatient      Level of care         medical floor       Toy Baker 10/18/2017, 10:04 PM    [facility]  Pager [phone]   after 2 AM please page floor coverage PA If 7AM-7PM, please contact the day team taking care of the patient  [email or web address]  Password [email or web address]

## 2017-10-19 ENCOUNTER — Encounter (HOSPITAL_COMMUNITY): Payer: Self-pay | Admitting: Radiology

## 2017-10-19 ENCOUNTER — Inpatient Hospital Stay (HOSPITAL_COMMUNITY): Payer: Managed Care, Other (non HMO)

## 2017-10-19 ENCOUNTER — Encounter: Payer: Self-pay | Admitting: Nurse Practitioner

## 2017-10-19 DIAGNOSIS — K920 Hematemesis: Secondary | ICD-10-CM

## 2017-10-19 DIAGNOSIS — R112 Nausea with vomiting, unspecified: Secondary | ICD-10-CM

## 2017-10-19 LAB — CBC
HCT: 36.3 % — ABNORMAL LOW (ref 39.0–52.0)
HEMATOCRIT: 37.4 % — AB (ref 39.0–52.0)
HEMATOCRIT: 35.6 % — AB (ref 39.0–52.0)
Hemoglobin: 11.6 g/dL — ABNORMAL LOW (ref 13.0–17.0)
Hemoglobin: 12.1 g/dL — ABNORMAL LOW (ref 13.0–17.0)
Hemoglobin: 11.6 g/dL — ABNORMAL LOW (ref 13.0–17.0)
MCH: 32.7 pg (ref 26.0–34.0)
MCH: 32.5 pg (ref 26.0–34.0)
MCH: 32.8 pg (ref 26.0–34.0)
MCHC: 32 g/dL (ref 30.0–36.0)
MCHC: 32.4 g/dL (ref 30.0–36.0)
MCHC: 32.6 g/dL (ref 30.0–36.0)
MCV: 102.3 fL — ABNORMAL HIGH (ref 78.0–100.0)
MCV: 100.5 fL — ABNORMAL HIGH (ref 78.0–100.0)
MCV: 100.6 fL — AB (ref 78.0–100.0)
Platelets: 233 10*3/uL (ref 150–400)
Platelets: 243 10*3/uL (ref 150–400)
Platelets: 238 10*3/uL (ref 150–400)
RBC: 3.55 MIL/uL — ABNORMAL LOW (ref 4.22–5.81)
RBC: 3.72 MIL/uL — ABNORMAL LOW (ref 4.22–5.81)
RBC: 3.54 MIL/uL — AB (ref 4.22–5.81)
RDW: 13.5 % (ref 11.5–15.5)
RDW: 13.4 % (ref 11.5–15.5)
RDW: 13.3 % (ref 11.5–15.5)
WBC: 6.3 10*3/uL (ref 4.0–10.5)
WBC: 5.3 10*3/uL (ref 4.0–10.5)
WBC: 5.5 10*3/uL (ref 4.0–10.5)

## 2017-10-19 LAB — PHOSPHORUS: Phosphorus: 3.3 mg/dL (ref 2.5–4.6)

## 2017-10-19 LAB — PROTIME-INR
INR: 1.11
PROTHROMBIN TIME: 14.2 s (ref 11.4–15.2)

## 2017-10-19 LAB — COMPREHENSIVE METABOLIC PANEL
ALK PHOS: 45 U/L (ref 38–126)
ALT: 47 U/L — AB (ref 0–44)
ANION GAP: 8 (ref 5–15)
AST: 33 U/L (ref 15–41)
Albumin: 3.1 g/dL — ABNORMAL LOW (ref 3.5–5.0)
BUN: 8 mg/dL (ref 6–20)
CALCIUM: 8.8 mg/dL — AB (ref 8.9–10.3)
CHLORIDE: 104 mmol/L (ref 98–111)
CO2: 27 mmol/L (ref 22–32)
CREATININE: 1.17 mg/dL (ref 0.61–1.24)
Glucose, Bld: 90 mg/dL (ref 70–99)
Potassium: 3.5 mmol/L (ref 3.5–5.1)
SODIUM: 139 mmol/L (ref 135–145)
Total Bilirubin: 0.9 mg/dL (ref 0.3–1.2)
Total Protein: 6.3 g/dL — ABNORMAL LOW (ref 6.5–8.1)

## 2017-10-19 LAB — MAGNESIUM: MAGNESIUM: 1.9 mg/dL (ref 1.7–2.4)

## 2017-10-19 LAB — TSH: TSH: 1.208 u[IU]/mL (ref 0.350–4.500)

## 2017-10-19 MED ORDER — SODIUM CHLORIDE 0.9 % IV SOLN
INTRAVENOUS | Status: AC
  Administered 2017-10-19: 11:00:00 via INTRAVENOUS

## 2017-10-19 MED ORDER — BOOST / RESOURCE BREEZE PO LIQD CUSTOM
1.0000 | Freq: Three times a day (TID) | ORAL | Status: DC
  Administered 2017-10-19 – 2017-10-22 (×9): 1 via ORAL

## 2017-10-19 MED ORDER — METOCLOPRAMIDE HCL 5 MG/ML IJ SOLN
10.0000 mg | Freq: Once | INTRAMUSCULAR | Status: AC
  Administered 2017-10-19: 10 mg via INTRAVENOUS
  Filled 2017-10-19: qty 2

## 2017-10-19 MED ORDER — IOPAMIDOL (ISOVUE-300) INJECTION 61%
15.0000 mL | INTRAVENOUS | Status: AC
  Administered 2017-10-19 (×2): 15 mL via ORAL

## 2017-10-19 MED ORDER — HYDROCODONE-ACETAMINOPHEN 5-325 MG PO TABS
1.0000 | ORAL_TABLET | Freq: Four times a day (QID) | ORAL | Status: DC | PRN
  Administered 2017-10-19: 1 via ORAL
  Administered 2017-10-19 – 2017-10-21 (×6): 2 via ORAL
  Filled 2017-10-19 (×7): qty 2

## 2017-10-19 MED ORDER — IOHEXOL 300 MG/ML  SOLN
100.0000 mL | Freq: Once | INTRAMUSCULAR | Status: AC | PRN
  Administered 2017-10-19: 100 mL via INTRAVENOUS

## 2017-10-19 NOTE — Evaluation (Signed)
Physical Therapy Evaluation & Discharge Patient Details Name: Kyle Berry MRN: [id number] DOB: December 31, 1970 Today's Date: 10/19/2017   History of Present Illness  Pt is a 47 y/o male admitted secondary to hematemesis and found to have acute pancreatitis. PMH including but not limited to chronic pancreatitis, alcohol abuse in remission, hypertension, hyperlipidemia, GERD, congenital single kidney.  Clinical Impression  Pt presented supine in bed with HOB elevated, awake and willing to participate in therapy session. Prior to admission, pt reported that he was independent with all functional mobility and ADLs. Pt works full-time. He is currently able to perform bed mobility at modified independence, transfers at modified independence and ambulated entire unit with supervision with no LOB or need for UE support. Pt reported that he feels he is at his baseline in regards to mobility. No further acute PT needs identified at this time. PT signing off.     Follow Up Recommendations No PT follow up    Equipment Recommendations  None recommended by PT    Recommendations for Other Services       Precautions / Restrictions Precautions Precautions: None Restrictions Weight Bearing Restrictions: No      Mobility  Bed Mobility Overal bed mobility: Modified Independent                Transfers Overall transfer level: Modified independent Equipment used: None                Ambulation/Gait Ambulation/Gait assistance: Supervision Gait Distance (Feet): 400 Feet Assistive device: None Gait Pattern/deviations: Step-through pattern Gait velocity: WFL Gait velocity interpretation: >2.62 ft/sec, indicative of community ambulatory General Gait Details: pt with steady reciprocal gait pattern with no LOB or need for UE supports  Stairs            Wheelchair Mobility    Modified Rankin (Stroke Patients Only)       Balance Overall balance assessment: No apparent  balance deficits (not formally assessed)                                           Pertinent Vitals/Pain Pain Assessment: No/denies pain    Home Living Family/patient expects to be discharged to:: Private residence Living Arrangements: Other relatives Available Help at Discharge: Family;Available 24 hours/day Type of Home: House Home Access: Stairs to enter   [employer] of Steps: 1 Home Layout: One level Home Equipment: None      Prior Function Level of Independence: Independent         Comments: works full-time     Journalist, newspaper        Extremity/Trunk Assessment   Upper Extremity Assessment Upper Extremity Assessment: Overall WFL for tasks assessed    Lower Extremity Assessment Lower Extremity Assessment: Overall WFL for tasks assessed    Cervical / Trunk Assessment Cervical / Trunk Assessment: Normal  Communication   Communication: No difficulties  Cognition Arousal/Alertness: Awake/alert Behavior During Therapy: WFL for tasks assessed/performed Overall Cognitive Status: Within Functional Limits for tasks assessed                                        General Comments      Exercises     Assessment/Plan    PT Assessment Patent does not need any further PT services  PT Problem List         PT Treatment Interventions      PT Goals (Current goals can be found in the Care Plan section)  Acute Rehab PT Goals Patient Stated Goal: return home, eat without vomiting    Frequency     Barriers to discharge        Co-evaluation               AM-PAC PT "6 Clicks" Daily Activity  Outcome Measure Difficulty turning over in bed (including adjusting bedclothes, sheets and blankets)?: None Difficulty moving from lying on back to sitting on the side of the bed? : None Difficulty sitting down on and standing up from a chair with arms (e.g., wheelchair, bedside commode, etc,.)?: None Help needed  moving to and from a bed to chair (including a wheelchair)?: None Help needed walking in hospital room?: None Help needed climbing 3-5 steps with a railing? : None 6 Click Score: 24    End of Session   Activity Tolerance: Patient tolerated treatment well Patient left: in chair;with call bell/phone within reach Nurse Communication: Mobility status PT Visit Diagnosis: Other abnormalities of gait and mobility (R26.89)    Time: 8592-9244 PT Time Calculation (min) (ACUTE ONLY): 10 min   Charges:   PT Evaluation $PT Eval Low Complexity: 1 Low     PT G Codes:        [place], PT, DPT [place] 10/19/2017, 10:00 AM

## 2017-10-19 NOTE — Progress Notes (Signed)
Initial Nutrition Assessment  DOCUMENTATION CODES:   Non-severe (moderate) malnutrition in context of acute illness/injury  INTERVENTION:   - Boost Breeze po TID, each supplement provides 250 kcal and 9 grams of protein (pt prefers peach flavor)  NUTRITION DIAGNOSIS:   Moderate Malnutrition related to acute illness(pancreatitis exacerbation) as evidenced by mild fat depletion, moderate fat depletion, mild muscle depletion, moderate muscle depletion.  GOAL:   Patient will meet greater than or equal to 90% of their needs  MONITOR:   PO intake, Weight trends, Supplement acceptance, Labs, Diet advancement  REASON FOR ASSESSMENT:   Malnutrition Screening Tool    ASSESSMENT:   47 year old male who presented to the ED with hematemesis. Pt discharged from the hospital on 7/1 for pancreatitis. PMH significant for prior EtOH abuse in remission, GERD, hypertension, and recurrent pancreatitis. Pt admitted for chronic pancreatitis exacerbation and hematemesis suspect secondary to Mallory-Weiss tear.  Spoke with pt and girlfriend at bedside. Pt reports that he has had a poor appetite over the past week since last admission due to not feeling well and N/V. Pt reports that his appetite is normally "pretty good" and that he typically eats 2 meals daily. Pt reports that this has not been the case over the past week as pt has not been able to tolerate much food. When pt does eat 2 meals, breakfast includes eggs, sausage, and grits. Dinner includes "anything." Pt reports drinking juice, water, and "sometimes" [employer]. Pt amenable to receiving Boost Breeze during current admission.  Pt endorses weight loss over the "past months." Pt reports that prior to weight loss, he weighed 190 lbs. Pt unable to state exactly when this was. Per weight history in chart, it appears that pt last weighed 192 lbs in 2017. Pt has experienced a 6 lb weight loss since 05/16/17. This is a 3.1% weight loss which is not  significant for timeframe. Upon nutrition-focused physical exam, pt is rather thin. Pt reports he has always been "lanky."  Meal Completion: 25% this morning (clear liquids)  Medications reviewed and include: Creon, IV Pepcid  Labs reviewed: hemoglobin 11.6 (L), HCT 36.3 (L)  NUTRITION - FOCUSED PHYSICAL EXAM:    Most Recent Value  Orbital Region  No depletion  Upper Arm Region  Moderate depletion  Thoracic and Lumbar Region  Mild depletion  Buccal Region  No depletion  Temple Region  No depletion  Clavicle Bone Region  Mild depletion  Clavicle and Acromion Bone Region  Mild depletion  Scapular Bone Region  Unable to assess  Dorsal Hand  No depletion  Patellar Region  Mild depletion  Anterior Thigh Region  Moderate depletion  Posterior Calf Region  Mild depletion  Edema (RD Assessment)  None  Hair  Reviewed  Eyes  Reviewed  Mouth  Reviewed  Skin  Reviewed  Nails  Reviewed       Diet Order:   Diet Order           Diet clear liquid Room service appropriate? Yes; Fluid consistency: Thin  Diet effective now          EDUCATION NEEDS:   No education needs have been identified at this time  Skin:  Skin Assessment: Reviewed RN Assessment  Last BM:  unknown/PTA  Height:   Ht Readings from Last 1 Encounters:  10/18/17 6\' 1"  (1.854 m)    Weight:   Wt Readings from Last 1 Encounters:  10/18/17 169 lb 12.1 oz (77 kg)    Ideal Body Weight:  83.64  kg  BMI:  Body mass index is 22.4 kg/m.  Estimated Nutritional Needs:   Kcal:  2200-2400 kcal/day  Protein:  105-120 grams/day  Fluid:  >/= 2.2 L/day    Gaynell Face, MS, RD, LDN Pager: [phone] Weekend/After Hours: [phone]

## 2017-10-19 NOTE — Consult Note (Addendum)
Consultation  Referring Provider: [facility]/Kyle Berry Primary Care Physician:  Kyle Berry, Kyle Berry Primary Gastroenterologist:  Kyle Berry  Reason for Consultation: Recurrent pancreatitis, hematemesis  HPI: Kyle Berry is a 47 y.o. male, known to Dr. Silverio Berry, with history of recurrent pancreatitis.  He has had several hospitalizations in the past couple of years with first episode of pancreatitis about 2 years ago.  At that time it was felt likely EtOH related Patient says he has not been drinking alcohol since that time and was only drinking beer at the time he was diagnosed with initial episode of pancreatitis.  He had been on an ACE inhibitor for hypertension which was felt possibly causative of subsequent episode and that was discontinued.  This year he has had several ER visits and 2 previous brief admissions one in May and again about a week ago at which time he was hospitalized for 3 days.  Liver tests have been normal, he has had elevated lipase, and with last admission had CT of the abdomen without IV contrast which did show acute pancreatitis, uncomplicated. Upper abdominal ultrasound done in May 2019 with normal-appearing gallbladder, no gallstones and normal CBD at 5 mm.    Last year he had MRCP done in July 2018 which again showed acute pancreatitis and suspected pancreas divisum, no evidence for chronic pancreatitis.  MRI/MRCP in May 2018 did raise question of chronic pancreatitis with a focally dilated portion of the dorsal pancreatic duct, and fused mild irregularity of the pancreatic duct. Patient says he did improve with his recent hospitalization and was being pushed to eat which he did, but developed worsening pain about 48 hours after discharge.  He had been taking tramadol for pain which initially was helpful and then ineffective.  On Thursday, 10/17/2017 he developed nausea and vomiting.  He vomited a couple of times prior to bringing up blood and vomited  bright red blood streaked in the emesis on several occasions. He was seen in our office yesterday afternoon by Kyle Berry, Kyle Berry and sent to the emergency room for other evaluation and admission. He is comfortable this morning, no further vomiting, no hematemesis and had normal appearing stool yesterday. Labs on admission hemoglobin 11.6 hematocrit of 36.2, hemoglobin stable this morning at 11.6. Lipase is elevated at 76 yesterday AST 64 ALT 60 2T bili 1.3.   Past Medical History:  Diagnosis Date  . Acute pancreatitis 11/23/2015  . Chronic bronchitis (Kyle Berry)   . Chronic lower back pain   . Congenital single kidney   . GERD (gastroesophageal reflux disease)   . Headache    "once/month" (05/13/2017)  . High cholesterol   . History of kidney stones   . Hypertension   . Migraine    "a couple/year" (05/13/2017)  . Pneumonia ~ 09/2015  . Recurrent acute pancreatitis   . Renal disorder     Past Surgical History:  Procedure Laterality Date  . NO PAST SURGERIES      Prior to Admission medications   Medication Sig Start Date End Date Taking? Authorizing Provider  albuterol (PROVENTIL HFA;VENTOLIN HFA) 108 (90 Base) MCG/ACT inhaler Inhale 1-2 puffs into the lungs every 4 (four) hours as needed for wheezing or shortness of breath.   Yes Provider, Historical, Kyle Berry  Carboxymethylcellulose Sodium (EYE DROPS OP) Place 2 drops into both eyes daily.    Yes Provider, Historical, Kyle Berry  diltiazem (DILACOR XR) 120 MG 24 hr capsule Take 1 capsule (120 mg total) by mouth daily. 10/14/17  Yes  Kyle Berry, Kyle Berry  famotidine (PEPCID) 20 MG tablet Take 1 tablet (20 mg total) by mouth 2 (two) times daily. Patient taking differently: Take 20 mg by mouth 2 (two) times daily as needed for heartburn or indigestion.  10/14/17  Yes Kyle Berry, Berry, Kyle Berry  feeding supplement (BOOST / RESOURCE BREEZE) LIQD Take 1 Container by mouth 3 (three) times daily between meals. 01/20/17  Yes Kyle Berry, Kyle Berry, Kyle Berry  lipase/protease/amylase  (CREON) 36000 UNITS CPEP capsule Take 1 capsule (36,000 Units total) by mouth See admin instructions. Take one capsules (36,000 units) by mouth with meals (2 meals daily) and one capsule (36000 units) with snacks daily 10/14/17  Yes Kyle Berry, Berry, Kyle Berry  promethazine (PHENERGAN) 25 MG tablet Take 1 tablet (25 mg total) by mouth every 6 (six) hours as needed for nausea or vomiting. 10/14/17  Yes Kyle Berry, Berry, Kyle Berry  traMADol (ULTRAM) 50 MG tablet 1 po q 8 hrs  Prn moderate pain Patient taking differently: Take 50 mg by mouth every 8 (eight) hours as needed for moderate pain.  10/14/17  Yes Kyle Berry, Kyle Berry    Current Facility-Administered Medications  Medication Dose Route Frequency Provider Last Rate Last Dose  . 0.9 %  sodium chloride infusion   Intravenous Continuous Kyle Berry, Kyle Berry 150 mL/hr at 10/19/17 0639    . acetaminophen (TYLENOL) tablet 650 mg  650 mg Oral Q6H PRN Kyle Berry, Kyle Berry       Or  . acetaminophen (TYLENOL) suppository 650 mg  650 mg Rectal Q6H PRN Kyle Berry, Kyle Berry, Kyle Berry      . diltiazem (CARDIZEM CD) 24 hr capsule 120 mg  120 mg Oral Daily Kyle Berry, Kyle Berry, Kyle Berry      . famotidine (PEPCID) IVPB 20 mg premix  20 mg Intravenous Q12H Kyle Berry, Kyle Berry   Stopped at 10/19/17 0007  . HYDROcodone-acetaminophen (NORCO/VICODIN) 5-325 MG per tablet 1-2 tablet  1-2 tablet Oral Q6H PRN Kyle Berry, Kyle Berry   2 tablet at 10/19/17 0416  . lipase/protease/amylase (CREON) capsule 36,000 Units  36,000 Units Oral TID WC Kyle Berry, Kyle Berry, Kyle Berry      . lipase/protease/amylase (CREON) capsule 36,000 Units  36,000 Units Oral BID PRN Kyle Berry, Kyle Berry, Kyle Berry      . ondansetron (ZOFRAN) tablet 4 mg  4 mg Oral Q6H PRN Kyle Berry, Kyle Berry, Kyle Berry       Or  . ondansetron (ZOFRAN) injection 4 mg  4 mg Intravenous Q6H PRN Kyle Berry, Kyle Berry   4 mg at 10/18/17 2334  . traMADol (ULTRAM) tablet 50 mg  50 mg Oral Q8H PRN Kyle Berry, Kyle Berry   50 mg at 10/18/17 2333     Allergies as of 10/18/2017 - Review Complete 10/18/2017  Allergen Reaction Noted  . Diclofenac Hives 12/14/2013  . Norvasc [amlodipine besylate] Other (See Comments) 08/15/2016  . Omeprazole Other (See Comments) 08/15/2016  . Prednisone Other (See Comments) 12/14/2013    Family History  Problem Relation Age of Onset  . Hypertension Other   . Hypertension Mother   . Hypertension Father   . Kidney disease Father   . Hypertension Sister   . Diabetes Sister   . Hypertension Brother     Social History   Socioeconomic History  . Marital status: Single    Spouse name: Not on file  . Number of children: Not on file  . Years of education: Not on file  . Highest education level: Not on file  Occupational History  . Not on file  Social Needs  . Financial resource strain: Not  on file  . Food insecurity:    Worry: Not on file    Inability: Not on file  . Transportation needs:    Medical: Not on file    Non-medical: Not on file  Tobacco Use  . Smoking status: Never Smoker  . Smokeless tobacco: Never Used  Substance and Sexual Activity  . Alcohol use: Not Currently    Comment: 05/13/2017 "might have beer a few times/year"  . Drug use: No  . Sexual activity: Yes    Birth control/protection: Condom  Lifestyle  . Physical activity:    Days per week: Not on file    Minutes per session: Not on file  . Stress: Not on file  Relationships  . Social connections:    Talks on phone: Not on file    Gets together: Not on file    Attends religious service: Not on file    Active member of club or organization: Not on file    Attends meetings of clubs or organizations: Not on file    Relationship status: Not on file  . Intimate partner violence:    Fear of current or ex partner: Not on file    Emotionally abused: Not on file    Physically abused: Not on file    Forced sexual activity: Not on file  Other Topics Concern  . Not on file  Social History Narrative  . Not on file     Review of Systems: Pertinent positive and negative review of systems were noted in the above HPI section.  All other review of systems was otherwise negative.Marland Kitchen  Physical Exam: Vital signs in last 24 hours: Temp:  [97.8 F (36.6 C)-98.4 F (36.9 C)] 97.8 F (36.6 C) (07/06 0518) Pulse Rate:  [60-91] 60 (07/06 0518) Resp:  [16-18] 18 (07/06 0518) BP: (125-165)/(88-109) 125/95 (07/06 0518) SpO2:  [97 %-100 %] 98 % (07/06 0518) Weight:  [169 lb 12.1 oz (77 kg)-178 lb (80.7 kg)] 169 lb 12.1 oz (77 kg) (07/05 2316)   General:   Alert,  Well-developed, well-nourished, African-American male pleasant and cooperative in NAD Head:  Normocephalic and atraumatic. Eyes:  Sclera clear, no icterus.   Conjunctiva pink. Ears:  Normal auditory acuity. Nose:  No deformity, discharge,  or lesions. Mouth:  No deformity or lesions.   Neck:  Supple; no masses or thyromegaly. Lungs:  Clear throughout to auscultation.   No wheezes, crackles, or rhonchi. Heart:  Regular rate and rhythm; no murmurs, clicks, rubs,  or gallops. Abdomen:  Soft,tender across upper abdomen, BS active,nonpalp mass or hsm.  No guarding or rebound Rectal:  Deferred  Msk:  Symmetrical without gross deformities. . Pulses:  Normal pulses noted. Extremities:  Without clubbing or edema. Neurologic:  Alert and  oriented x4;  grossly normal neurologically. Skin:  Intact without significant lesions or rashes.. Psych:  Alert and cooperative. Normal mood and affect.  Intake/Output from previous day: 07/05 0701 - 07/06 0700 In: 2636 [I.V.:586; IV Piggyback:2050] Out: 1200 [Urine:1200] Intake/Output this shift: Total I/O In: -  Out: 400 [Urine:400]  Lab Results: Recent Labs    10/18/17 1633 10/18/17 2242 10/19/17 0628  WBC 6.0 6.0 6.3  HGB 12.4* 11.6* 11.6*  HCT 38.7* 36.2* 36.3*  PLT 253 226 233   BMET Recent Labs    10/18/17 1633 10/19/17 0628  NA 138 139  K 4.6 3.5  CL 103 104  CO2 26 27  GLUCOSE 95 90  BUN 12  8  CREATININE 1.22 1.17  CALCIUM 9.1 8.8*   LFT Recent Labs    10/19/17 0628  PROT 6.3*  ALBUMIN 3.1*  AST 33  ALT 47*  ALKPHOS 45  BILITOT 0.9   PT/INR Recent Labs    10/19/17 0235  LABPROT 14.2  INR 1.11      IMPRESSION:  #58 47 year old African-American male with multiple episodes of recurrent acute pancreatitis, admitted yesterday with worsening pain after recent brief admission for another episode of acute pancreatitis.  Patient has had associated nausea vomiting and hematemesis which appears to have been self-limited and with stable hemoglobin overnight. Patient may have had a small Mallory-Weiss tear but is not showing any evidence of active GI bleeding at present  #2 possible underlying chronic pancreatitis-this was raised on MRI in May 2018 but follow-up in July 2018 did not comment on any ductal irregularity or changes of chronic pancreatitis #3 probable pancreas divisum  #4 hypertension #5 atrophic right kidney  PLAN: #1 continue IV Pepcid every 12 hours #2 start sips of clear liquids #3 serial hemoglobins, the patient has further evidence of hematemesis, melena or drop in hemoglobin we will proceed with EGD otherwise observe #4 schedule for CT of the abdomen and pelvis with IV contrast #5 patient may benefit from referral to [facility] for consideration of ERCP and minor duct sphincterotomy/stent if pancreas divisum confirmed.   Amy Esterwood  10/19/2017, 8:48 AM  ________________________________________________________________________  [facility] Kyle Berry note:  I personally examined the patient, reviewed the data and agree with the assessment and plan described above.  This episode is probably continued, persistent pain and inflammation related to the episode last week-bounce back.  Etiology unclear, question of possible divisum raised on MRI previously. This MAY or MAY not be related since 7-10% population has divisum.  I think it will be worth his while to meet with a  general surgeon following recovery from this episode to consider GB resection even though no obvious stones on [place], other imaging.  Very small stones, grains of sand nearly, can cause pancreatitis and so elective resection is not uncommon.  Will follow along, for now IV fluids, sips clears, pain meds, repeat imaging with CT (already ordered). Will add IgG4 level to labs tomorrow.   Owens Loffler, Kyle Berry [facility] Pager [phone]

## 2017-10-19 NOTE — Progress Notes (Signed)
[facility] PROGRESS NOTE  Kyle Berry ZOX:[id number] DOB: 1970/11/22 DOA: 10/18/2017 PCP: Kerin Perna, NP   Narrative: 47 year old male history of chronic pancreatitis?  Pancreatic divisum which abuse with abstinence X1 year, HTN HLD GERD congenital solitary kidney Admitted with hematemesis 2 episodes 7/5-he self reported to gastroenterology at the office 7 episodes? Note recent discharge from hospital 7/1   A & Plan Hematemesis,?  Mallory-Weiss tear-appreciate GI input-diet as per them-no current role for EGD as per their input  Pancreatic divisum, acute on chronic pancreatitis-lipase not repeated this morning but patient looks fine--patient to continue Creon Follow IgG4 as per GI for prognostication purposes Pain control tramadol 50 every 8 and can use if severe Norco Continue Pepcid 20 IV every 12 defer PPI to GI Await CT scan abdomen pelvis with IV contrast outpatient coordination of care by GI with regards to resection of gallbladder because of pancreatic divisum  Atypical chest pain in the past probably from GI issues  Hypertension continue Cardizem 120 daily-controlled  Solitary left kidney congenital      DVT prophylaxis: Lovenox code Status: Full code family Communication: None present at the bedside disposition Plan: Inpatient on Erline Levine, MD  [facility] Direct contact: [phone] --Via amion app OR  --[email or web address]; password [email or web address]  7PM-7AM contact night coverage as above 10/19/2017, 10:07 AM  LOS: 1 day   Consultants:  GI  Procedures:  CT scan abdomen pelvis pending 7/6  Antimicrobials:  None at this time  Interval history/Subjective: Awake alert pleasant looks surprisingly well all things considered Does not appear to be in pain Eager to try soft diet/liquids   Objective:  Vitals:  Vitals:   10/19/17 0518 10/19/17 1003  BP: (!) 125/95 136/86  Pulse: 60 (!) 57  Resp: 18 18  Temp: 97.8 F (36.6 C) 98.3  F (36.8 C)  SpO2: 98% 100%    Exam:  Constitutional:  . Appears calm and comfortable Eyes:  . pupils and irises appear normal . Normal lids and conjunctivae ENMT:  . grossly normal hearing  . Lips appear normal . external ears, nose appear normal . Oropharynx: mucosa, tongue,posterior pharynx appear normal Neck:  . neck appears normal, no masses, normal ROM, supple . no thyromegaly Respiratory:  . CTA bilaterally, no w/r/r.  . Respiratory effort normal. No retractions or accessory muscle use Cardiovascular:  . RRR, no m/r/g . No LE extremity edema   . Normal pedal pulses Abdomen:  . Abdomen appears normal; no tenderness or masses . No hernias . No HSM Musculoskeletal:  . Digits/nails BUE: no clubbing, cyanosis, petechiae, infection . exam of joints, bones, muscles of at least one of following: head/neck, RUE, LUE, RLE, LLE   o strength and tone normal, no atrophy, no abnormal movements o No tenderness, masses o Normal ROM, no contractures  . gait and station Skin:  . No rashes, lesions, ulcers . palpation of skin: no induration or nodules Neurologic:  . CN 2-12 intact . Sensation all 4 extremities intact Psychiatric:  . Mental status o Mood, affect appropriate o Orientation to person, place, time  . judgment and insight appear intact     I have personally reviewed the following:   Labs:  Reviewed hemoglobin is stable range since yesterday  LFTs within normal limits with slight increase above baseline range of ALT 47  Imaging studies:  Await CT scan  Medical tests:  None  Test discussed with performing physician:  No  Decision to obtain old records:  Yes  Review and summation of old records:  Briefly reviewed  Scheduled Meds: . diltiazem  120 mg Oral Daily  . lipase/protease/amylase  36,000 Units Oral TID WC   Continuous Infusions: . famotidine (PEPCID) IV Stopped (10/19/17 0007)    Active Problems:   Essential hypertension    Acute on chronic pancreatitis ([place])   Hematemesis   LOS: 1 day

## 2017-10-20 DIAGNOSIS — K861 Other chronic pancreatitis: Secondary | ICD-10-CM

## 2017-10-20 DIAGNOSIS — E440 Moderate protein-calorie malnutrition: Secondary | ICD-10-CM

## 2017-10-20 LAB — CBC
HCT: 37.2 % — ABNORMAL LOW (ref 39.0–52.0)
HEMOGLOBIN: 11.9 g/dL — AB (ref 13.0–17.0)
MCH: 32.4 pg (ref 26.0–34.0)
MCHC: 32 g/dL (ref 30.0–36.0)
MCV: 101.4 fL — ABNORMAL HIGH (ref 78.0–100.0)
Platelets: 260 10*3/uL (ref 150–400)
RBC: 3.67 MIL/uL — ABNORMAL LOW (ref 4.22–5.81)
RDW: 13.3 % (ref 11.5–15.5)
WBC: 5 10*3/uL (ref 4.0–10.5)

## 2017-10-20 LAB — LIPASE, BLOOD: LIPASE: 70 U/L — AB (ref 11–51)

## 2017-10-20 NOTE — Progress Notes (Signed)
[facility] Progress Note    Since last GI note: Tolerating clears, ambulating in room. Pain improving.  Objective: Vital signs in last 24 hours: Temp:  [97.7 F (36.5 C)-98.6 F (37 C)] 98.4 F (36.9 C) (07/07 0603) Pulse Rate:  [57-69] 58 (07/07 0603) Resp:  [17-18] 18 (07/07 0603) BP: (113-138)/(79-94) 122/84 (07/07 0603) SpO2:  [97 %-100 %] 98 % (07/07 0603) Last BM Date: 10/19/17 General: alert and oriented times 3 Heart: regular rate and rythm Abdomen: soft, mildly tender throughout. non-distended, normal bowel sounds   Lab Results: Recent Labs    10/19/17 1318 10/19/17 2132 10/20/17 0545  WBC 5.3 5.5 5.0  HGB 12.1* 11.6* 11.9*  PLT 243 238 260  MCV 100.5* 100.6* 101.4*   Recent Labs    10/18/17 1633 10/19/17 0628  NA 138 139  K 4.6 3.5  CL 103 104  CO2 26 27  GLUCOSE 95 90  BUN 12 8  CREATININE 1.22 1.17  CALCIUM 9.1 8.8*   Recent Labs    10/18/17 1633 10/19/17 0628  PROT 7.0 6.3*  ALBUMIN 3.7 3.1*  AST 64* 33  ALT 62* 47*  ALKPHOS 53 45  BILITOT 1.3* 0.9   Recent Labs    10/19/17 0235  INR 1.11     Studies/Results: Dg Chest 2 View  Result Date: 10/18/2017 CLINICAL DATA:  Hematemesis since yesterday. EXAM: CHEST - 2 VIEW COMPARISON:  PA and lateral chest 04/08/2017. Single-view of the chest 10/12/2017. FINDINGS: The lungs are clear. Heart size is normal. No pneumothorax or pleural effusion. No focal bony abnormality IMPRESSION: Negative chest. Electronically Signed   By: Inge Rise M.D.   On: 10/18/2017 18:33   Ct Abdomen Pelvis W Contrast  Result Date: 10/19/2017 CLINICAL DATA:  Recent diagnosis of pancreatitis with persistent abdominal pain. EXAM: CT ABDOMEN AND PELVIS WITH CONTRAST TECHNIQUE: Multidetector CT imaging of the abdomen and pelvis was performed using the standard protocol following bolus administration of intravenous contrast. CONTRAST:  166mL OMNIPAQUE IOHEXOL 300 MG/ML  SOLN COMPARISON:  CT abdomen  pelvis-10/11/2017; 01/17/2017; 06/24/2016 FINDINGS: Lower chest: Limited visualization the lower thorax demonstrates minimal dependent subpleural atelectasis within the imaged left lower lobe. No focal airspace opacities. No pleural effusion. Borderline cardiomegaly.  No pericardial effusion. Hepatobiliary: Normal hepatic contour. Punctate subcentimeter hypoattenuating lesions within the dome of the right lobe of the liver (images 11 and 13, series 3) are too small to adequately characterize though similar to the 06/2016 examination and favored to represent hepatic cysts. Normal appearance of the gallbladder given degree distention. No radiopaque gallstones. No intra or extrahepatic biliary ductal dilatation. No ascites. Pancreas: Grossly unchanged minimal amount of peripancreatic stranding (axial image 28, series 3; coronal image 37, series 6). There is homogeneous enhancement of the pancreatic parenchyma without evidence of pancreatic necrosis. No definitive pancreatic mass or pancreatic ductal dilatation on this non pancreatic protocol CT. 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 There is homogeneous enhancement of the left kidney. Unchanged punctate adjacent nonobstructing stones within inferior pole of the left kidney with dominant stone measuring approximately 8 mm (coronal image 46, series 6). No urinary obstruction or perinephric stranding. Note is made of an approximately 1.3 cm hypoattenuating nonenhancing cyst arising from the posterosuperior aspect the right kidney. No discrete left-sided renal lesions. Normal appearance of the bilateral adrenal glands. Normal appearance of the urinary bladder given degree distention. Stomach/Bowel: Ingested enteric contrast extends to the level the rectum. No evidence of  enteric obstruction normal appearance of the terminal ileum and retrocecal appendix. No pneumoperitoneum, pneumatosis or  portal venous gas. Vascular/Lymphatic: Normal caliber the abdominal aorta. The major branch vessels of the abdominal aorta appear widely patent. The splenic artery and SMA the both appear widely patent without evidence of vessel irregularity. No bulky retroperitoneal, mesenteric, pelvic or inguinal lymphadenopathy. Reproductive: Normal appearance of the pelvic organs. No free fluid in the pelvic cul-de-sac. Other: There is a minimal amount of subcutaneous edema about the midline of the low back. Musculoskeletal: No acute or aggressive osseous abnormalities. Moderate DDD of L5-S1 with disc space height loss, endplate irregularity and sclerosis. IMPRESSION: 1. Grossly unchanged findings compatible with acute uncomplicated pancreatitis. Specifically, no evidence pancreatic necrosis or definable/drainable pancreatic pseudocyst. 2. Nonobstructing left-sided nephrolithiasis. 3. 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 Electronically Signed   By: Sandi Mariscal M.D.   On: 10/19/2017 13:54     Medications: Scheduled Meds: . diltiazem  120 mg Oral Daily  . feeding supplement  1 Container Oral TID BM  . lipase/protease/amylase  36,000 Units Oral TID WC   Continuous Infusions: . famotidine (PEPCID) IV 20 mg (10/19/17 2031)   PRN Meds:.acetaminophen **OR** acetaminophen, HYDROcodone-acetaminophen, lipase/protease/amylase, ondansetron **OR** ondansetron (ZOFRAN) IV, traMADol    Assessment/Plan: 47 y.o. male with recurrent acute pancreatitis.  Previously felt his issues were etoh related and they may have been. He is pretty clear however that he's not had etoh in about a year and I believe him.  Still awaiting IgG 4 level and if very high than a good chance this is autoimmune pancreatitis.  If IgG 4 level normal I think he should be considered for elective cholecystectomy; no stones ever seen on MRI or [place] but perhaps he has 'microlithiasis?'  Should stay on clears today, if tolerating still by  tomorrow then advance to low fat solids.     Milus Banister, MD  10/20/2017, 9:01 AM [facility] Pager [phone]

## 2017-10-20 NOTE — Progress Notes (Signed)
[facility] PROGRESS NOTE  Kyle Berry WKM:[id number] DOB: 03-02-1971 DOA: 10/18/2017 PCP: Kerin Perna, NP   Narrative: 47 year old male history of chronic pancreatitis?  Pancreatic divisum which abuse with abstinence X1 year, HTN HLD GERD congenital solitary kidney Admitted with hematemesis 2 episodes 7/5-he self reported to gastroenterology at the office 7 episodes? Note recent discharge from hospital 7/1 GI saw the patient in consult and recommended conservative management   A & Plan Hematemesis,?  Mallory-Weiss tear-appreciate GI input-diet as per them-no current role for EGD as per their input Pancreatic divisum, acute on chronic pancreatitis-lipase 70-patient to continue Creon Follow IgG4 is pending Pain control tramadol 50 every 8 and can use if severe Norco Continue Pepcid 20 IV every 12 defer PPI to GI  Atypical chest pain in the past probably from GI issues  Hypertension continue Cardizem 120 daily-controlled  Solitary left kidney congenital      DVT prophylaxis: Lovenox code Status: Full code family Communication: None present at the bedside disposition Plan: Inpatient on Erline Levine, MD  [facility] Direct contact: [phone] --Via amion app OR  --[email or web address]; password [email or web address]  7PM-7AM contact night coverage as above 10/20/2017, 11:43 AM  LOS: 2 days   Consultants:  GI  Procedures:  CT scan abdomen as below  Antimicrobials:  None at this time  Interval history/Subjective:  Awake alert pleasant no distress tolerating some diet and asking to tolerate upgrade of the same Understands we need to go slow No vomiting No stool Still mild tenderness in epigastrium  Objective:  Vitals:  Vitals:   10/20/17 0603 10/20/17 1048  BP: 122/84 121/79  Pulse: (!) 58 63  Resp: 18 20  Temp: 98.4 F (36.9 C) 98.5 F (36.9 C)  SpO2: 98% 99%    Exam:  Pleasant awake alert oriented no distress looks younger than stated age  no icterus no pallor dentition is good neck soft supple no thyromegaly Chest is clear without added sound abdomen is slightly soft in the epigastrium no rebound no lower extremity edema range of motion intact smile symmetric sensory intact  I have personally reviewed the following:   Labs:  Lipase 70  Imaging studies:  ET scan done 7/6 does not show any complications of pancreatitis and only simple uncomplicated pancreatitis  Medical tests:  None  Test discussed with performing physician:  We discussed with Dr. Ardis Hughs who states we will follow IgG4 and GI will speak to general surgery about possible elective gallbladder removal this admission versus closely as an outpatient given bounce back  Decision to obtain old records:  Yes  Review and summation of old records:  Briefly reviewed  Scheduled Meds: . diltiazem  120 mg Oral Daily  . feeding supplement  1 Container Oral TID BM  . lipase/protease/amylase  36,000 Units Oral TID WC   Continuous Infusions: . famotidine (PEPCID) IV 20 mg (10/20/17 1019)    Active Problems:   Essential hypertension   Acute on chronic pancreatitis (HCC)   Hematemesis   Malnutrition of moderate degree   LOS: 2 days

## 2017-10-21 ENCOUNTER — Encounter (HOSPITAL_COMMUNITY): Payer: Self-pay | Admitting: General Practice

## 2017-10-21 DIAGNOSIS — K859 Acute pancreatitis without necrosis or infection, unspecified: Principal | ICD-10-CM

## 2017-10-21 LAB — CBC WITH DIFFERENTIAL/PLATELET
Abs Immature Granulocytes: 0 10*3/uL (ref 0.0–0.1)
BASOS ABS: 0 10*3/uL (ref 0.0–0.1)
BASOS PCT: 0 %
EOS ABS: 0.1 10*3/uL (ref 0.0–0.7)
EOS PCT: 2 %
HEMATOCRIT: 37.2 % — AB (ref 39.0–52.0)
Hemoglobin: 11.8 g/dL — ABNORMAL LOW (ref 13.0–17.0)
IMMATURE GRANULOCYTES: 0 %
LYMPHS ABS: 2.7 10*3/uL (ref 0.7–4.0)
Lymphocytes Relative: 53 %
MCH: 32.5 pg (ref 26.0–34.0)
MCHC: 31.7 g/dL (ref 30.0–36.0)
MCV: 102.5 fL — ABNORMAL HIGH (ref 78.0–100.0)
Monocytes Absolute: 0.4 10*3/uL (ref 0.1–1.0)
Monocytes Relative: 9 %
NEUTROS PCT: 36 %
Neutro Abs: 1.8 10*3/uL (ref 1.7–7.7)
PLATELETS: 271 10*3/uL (ref 150–400)
RBC: 3.63 MIL/uL — AB (ref 4.22–5.81)
RDW: 13.3 % (ref 11.5–15.5)
WBC: 5 10*3/uL (ref 4.0–10.5)

## 2017-10-21 LAB — IGG 4: IGG 4: 7 mg/dL (ref 2–96)

## 2017-10-21 MED ORDER — FAMOTIDINE 20 MG PO TABS
20.0000 mg | ORAL_TABLET | Freq: Two times a day (BID) | ORAL | Status: DC
  Administered 2017-10-21 – 2017-10-22 (×4): 20 mg via ORAL
  Filled 2017-10-21 (×4): qty 1

## 2017-10-21 NOTE — Consult Note (Addendum)
[facility] Surgery Consult Note  Kyle Berry July 10, 1970  [id number].    Requesting MD: Nita Sells Chief Complaint/Reason for Consult: recurrent pancreatitis  HPI:  Kyle Berry is a 47yo male currently admitted to [facility] with recurrent pancreatitis. First episode of pancreatitis was about 2 years ago and he has had multiple hospitalizations since that time. Initially felt to be due to alcohol abuse but patient states that he has had no alcohol intake 01/2017. Gastroenterology has been following the patient and is evaluating for possible autoimmune pancreatitis, IgG 4 currently pending. If this test is negative they have asked general surgery for consult to consider elective cholecystectomy as gallstones/microlithiasis could be the cause of his recurrent pancreatitis.  Of note, patient has had multiple CT scans and u/s's that have showed no gallstones.   Today patient states that he is feeling much better. Reports mild upper abdominal pain. Tolerating clear liquids. Lipase was 70 yesterday.  PMH significant for chronic pancreatitis, alcohol abuse in remission, hypertension, hyperlipidemia, GERD, congenital single kidney Abdominal surgical history: none Nonsmoker Former alcohol abuse - in remission since 01/2017 Employment: Engineer, building services  ROS: Review of Systems  Constitutional: Negative.   HENT: Negative.   Eyes: Negative.   Respiratory: Negative.   Cardiovascular: Negative.   Gastrointestinal: Positive for abdominal pain.  Genitourinary: Negative.   Musculoskeletal: Negative.   Skin: Negative.   Neurological: Negative.     All systems reviewed and otherwise negative except for as above  Family History  Problem Relation Age of Onset  . Hypertension Other   . Hypertension Mother   . Hypertension Father   . Kidney disease Father   . Hypertension Sister   . Diabetes Sister   . Hypertension Brother     Past Medical History:  Diagnosis Date  .  Acute pancreatitis 11/23/2015  . Chronic bronchitis ([place])   . Chronic lower back pain   . Congenital single kidney   . GERD (gastroesophageal reflux disease)   . Headache    "once/month" (05/13/2017)  . High cholesterol   . History of kidney stones   . Hypertension   . Migraine    "a couple/year" (05/13/2017)  . Pneumonia ~ 09/2015  . Recurrent acute pancreatitis   . Renal disorder     Past Surgical History:  Procedure Laterality Date  . NO PAST SURGERIES      Social History:  reports that he has never smoked. He has never used smokeless tobacco. He reports that he drank alcohol. He reports that he does not use drugs.  Allergies:  Allergies  Allergen Reactions  . Diclofenac Hives  . Norvasc [Amlodipine Besylate] Other (See Comments)    Fluid buildup in chest  . Omeprazole Other (See Comments)    MD stopped due to pancreatitis  . Prednisone Other (See Comments)    Mood swings    Medications Prior to Admission  Medication Sig Dispense Refill  . albuterol (PROVENTIL HFA;VENTOLIN HFA) 108 (90 Base) MCG/ACT inhaler Inhale 1-2 puffs into the lungs every 4 (four) hours as needed for wheezing or shortness of breath.    . Carboxymethylcellulose Sodium (EYE DROPS OP) Place 2 drops into both eyes daily.     Marland Kitchen diltiazem (DILACOR XR) 120 MG 24 hr capsule Take 1 capsule (120 mg total) by mouth daily. 30 capsule 2  . famotidine (PEPCID) 20 MG tablet Take 1 tablet (20 mg total) by mouth 2 (two) times daily. (Patient taking differently: Take 20 mg by mouth 2 (two) times  daily as needed for heartburn or indigestion. ) 60 tablet 2  . feeding supplement (BOOST / RESOURCE BREEZE) LIQD Take 1 Container by mouth 3 (three) times daily between meals. 30 Container 0  . lipase/protease/amylase (CREON) 36000 UNITS CPEP capsule Take 1 capsule (36,000 Units total) by mouth See admin instructions. Take one capsules (36,000 units) by mouth with meals (2 meals daily) and one capsule (36000 units) with snacks  daily 120 capsule 3  . promethazine (PHENERGAN) 25 MG tablet Take 1 tablet (25 mg total) by mouth every 6 (six) hours as needed for nausea or vomiting. 20 tablet 0  . traMADol (ULTRAM) 50 MG tablet 1 po q 8 hrs  Prn moderate pain (Patient taking differently: Take 50 mg by mouth every 8 (eight) hours as needed for moderate pain. ) 15 tablet 0    Prior to Admission medications   Medication Sig Start Date End Date Taking? Authorizing Provider  albuterol (PROVENTIL HFA;VENTOLIN HFA) 108 (90 Base) MCG/ACT inhaler Inhale 1-2 puffs into the lungs every 4 (four) hours as needed for wheezing or shortness of breath.   Yes [provider]  Carboxymethylcellulose Sodium (EYE DROPS OP) Place 2 drops into both eyes daily.    Yes [provider]  diltiazem (DILACOR XR) 120 MG 24 hr capsule Take 1 capsule (120 mg total) by mouth daily. 10/14/17  Yes Emokpae, [name], MD  famotidine (PEPCID) 20 MG tablet Take 1 tablet (20 mg total) by mouth 2 (two) times daily. Patient taking differently: Take 20 mg by mouth 2 (two) times daily as needed for heartburn or indigestion.  10/14/17  Yes Emokpae, [name], MD  feeding supplement (BOOST / RESOURCE BREEZE) LIQD Take 1 Container by mouth 3 (three) times daily between meals. 01/20/17  Yes Mikhail, Velta Addison, DO  lipase/protease/amylase (CREON) 36000 UNITS CPEP capsule Take 1 capsule (36,000 Units total) by mouth See admin instructions. Take one capsules (36,000 units) by mouth with meals (2 meals daily) and one capsule (36000 units) with snacks daily 10/14/17  Yes Emokpae, [name], MD  promethazine (PHENERGAN) 25 MG tablet Take 1 tablet (25 mg total) by mouth every 6 (six) hours as needed for nausea or vomiting. 10/14/17  Yes Emokpae, [name], MD  traMADol (ULTRAM) 50 MG tablet 1 po q 8 hrs  Prn moderate pain Patient taking differently: Take 50 mg by mouth every 8 (eight) hours as needed for moderate pain.  10/14/17  Yes Emokpae, [name], MD    Blood pressure 122/79,  pulse (!) 53, temperature 97.6 F (36.4 C), temperature source Oral, resp. rate 16, height 6\' 1"  (1.854 m), weight 169 lb 12.1 oz (77 kg), SpO2 100 %. Physical Exam: General: pleasant, WD/WN AA male who is laying in bed in NAD HEENT: head is normocephalic, atraumatic.  Sclera are noninjected.  Pupils equal and round.  Ears and nose without any masses or lesions.  Mouth is pink and moist. Dentition fair Heart: regular, rate, and rhythm.  No obvious murmurs, gallops, or rubs noted.  Palpable pedal pulses bilaterally Lungs: CTAB, no wheezes, rhonchi, or rales noted.  Respiratory effort nonlabored Abd: soft, NT/ND, +BS, no masses, hernias, or organomegaly MS: all 4 extremities are symmetrical with no cyanosis, clubbing, or edema. Skin: warm and dry with no masses, lesions, or rashes Psych: A&Ox3 with an appropriate affect. Neuro: cranial nerves grossly intact, extremity CSM intact bilaterally, normal speech  Results for orders placed or performed during the hospital encounter of 10/18/17 (from the past 48 hour(s))  CBC  Status: Abnormal   Collection Time: 10/19/17  1:18 PM  Result Value Ref Range   WBC 5.3 4.0 - 10.5 K/uL   RBC 3.72 (L) 4.22 - 5.81 MIL/uL   Hemoglobin 12.1 (L) 13.0 - 17.0 g/dL   HCT 37.4 (L) 39.0 - 52.0 %   MCV 100.5 (H) 78.0 - 100.0 fL   MCH 32.5 26.0 - 34.0 pg   MCHC 32.4 30.0 - 36.0 g/dL   RDW 13.4 11.5 - 15.5 %   Platelets 243 150 - 400 K/uL    Comment: Performed at [facility], [employer] [address], [place], [place] [address]  CBC     Status: Abnormal   Collection Time: 10/19/17  9:32 PM  Result Value Ref Range   WBC 5.5 4.0 - 10.5 K/uL   RBC 3.54 (L) 4.22 - 5.81 MIL/uL   Hemoglobin 11.6 (L) 13.0 - 17.0 g/dL   HCT 35.6 (L) 39.0 - 52.0 %   MCV 100.6 (H) 78.0 - 100.0 fL   MCH 32.8 26.0 - 34.0 pg   MCHC 32.6 30.0 - 36.0 g/dL   RDW 13.3 11.5 - 15.5 %   Platelets 238 150 - 400 K/uL    Comment: Performed at [facility], [place] [address] [place],  [place] [address]  CBC     Status: Abnormal   Collection Time: 10/20/17  5:45 AM  Result Value Ref Range   WBC 5.0 4.0 - 10.5 K/uL   RBC 3.67 (L) 4.22 - 5.81 MIL/uL   Hemoglobin 11.9 (L) 13.0 - 17.0 g/dL   HCT 37.2 (L) 39.0 - 52.0 %   MCV 101.4 (H) 78.0 - 100.0 fL   MCH 32.4 26.0 - 34.0 pg   MCHC 32.0 30.0 - 36.0 g/dL   RDW 13.3 11.5 - 15.5 %   Platelets 260 150 - 400 K/uL    Comment: Performed at [facility], [place] [address], [place], [place] [address]  Lipase, blood     Status: Abnormal   Collection Time: 10/20/17  5:45 AM  Result Value Ref Range   Lipase 70 (H) 11 - 51 U/L    Comment: Performed at [facility], [place] [address], [place], [place] [address]  CBC with Differential/Platelet     Status: Abnormal   Collection Time: 10/21/17  6:03 AM  Result Value Ref Range   WBC 5.0 4.0 - 10.5 K/uL   RBC 3.63 (L) 4.22 - 5.81 MIL/uL   Hemoglobin 11.8 (L) 13.0 - 17.0 g/dL   HCT 37.2 (L) 39.0 - 52.0 %   MCV 102.5 (H) 78.0 - 100.0 fL   MCH 32.5 26.0 - 34.0 pg   MCHC 31.7 30.0 - 36.0 g/dL   RDW 13.3 11.5 - 15.5 %   Platelets 271 150 - 400 K/uL   Neutrophils Relative % 36 %   Neutro Abs 1.8 1.7 - 7.7 K/uL   Lymphocytes Relative 53 %   Lymphs Abs 2.7 0.7 - 4.0 K/uL   Monocytes Relative 9 %   Monocytes Absolute 0.4 0.1 - 1.0 K/uL   Eosinophils Relative 2 %   Eosinophils Absolute 0.1 0.0 - 0.7 K/uL   Basophils Relative 0 %   Basophils Absolute 0.0 0.0 - 0.1 K/uL   Immature Granulocytes 0 %   Abs Immature Granulocytes 0.0 0.0 - 0.1 K/uL    Comment: Performed at [facility], [address], [place],  [address]   Ct Abdomen Pelvis W Contrast  Result Date: 10/19/2017 CLINICAL DATA:  Recent diagnosis of pancreatitis with persistent abdominal pain. EXAM: CT ABDOMEN AND PELVIS WITH CONTRAST TECHNIQUE: Multidetector CT imaging of the abdomen and pelvis was performed using the standard protocol following bolus administration of intravenous contrast. CONTRAST:  159mL OMNIPAQUE  IOHEXOL 300 MG/ML  SOLN COMPARISON:  CT abdomen pelvis-10/11/2017; 01/17/2017; 06/24/2016 FINDINGS: Lower chest: Limited visualization the lower thorax demonstrates minimal dependent subpleural atelectasis within the imaged left lower lobe. No focal airspace opacities. No pleural effusion. Borderline cardiomegaly.  No pericardial effusion. Hepatobiliary: Normal hepatic contour. Punctate subcentimeter hypoattenuating lesions within the dome of the right lobe of the liver (images 11 and 13, series 3) are too small to adequately characterize though similar to the 06/2016 examination and favored to represent hepatic cysts. Normal appearance of the gallbladder given degree distention. No radiopaque gallstones. No intra or extrahepatic biliary ductal dilatation. No ascites. Pancreas: Grossly unchanged minimal amount of peripancreatic stranding (axial image 28, series 3; coronal image 37, series 6). There is homogeneous enhancement of the pancreatic parenchyma without evidence of pancreatic necrosis. No definitive pancreatic mass or pancreatic ductal dilatation on this non pancreatic protocol CT. 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 There is homogeneous enhancement of the left kidney. Unchanged punctate adjacent nonobstructing stones within inferior pole of the left kidney with dominant stone measuring approximately 8 mm (coronal image 46, series 6). No urinary obstruction or perinephric stranding. Note is made of an approximately 1.3 cm hypoattenuating nonenhancing cyst arising from the posterosuperior aspect the right kidney. No discrete left-sided renal lesions. Normal appearance of the bilateral adrenal glands. Normal appearance of the urinary bladder given degree distention. Stomach/Bowel: Ingested enteric contrast extends to the level the rectum. No evidence of enteric obstruction normal appearance of the terminal ileum and retrocecal  appendix. No pneumoperitoneum, pneumatosis or portal venous gas. Vascular/Lymphatic: Normal caliber the abdominal aorta. The major branch vessels of the abdominal aorta appear widely patent. The splenic artery and SMA the both appear widely patent without evidence of vessel irregularity. No bulky retroperitoneal, mesenteric, pelvic or inguinal lymphadenopathy. Reproductive: Normal appearance of the pelvic organs. No free fluid in the pelvic cul-de-sac. Other: There is a minimal amount of subcutaneous edema about the midline of the low back. Musculoskeletal: No acute or aggressive osseous abnormalities. Moderate DDD of L5-S1 with disc space height loss, endplate irregularity and sclerosis. IMPRESSION: 1. Grossly unchanged findings compatible with acute uncomplicated pancreatitis. Specifically, no evidence pancreatic necrosis or definable/drainable pancreatic pseudocyst. 2. Nonobstructing left-sided nephrolithiasis. 3. 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 Electronically Signed   By: Sandi Mariscal M.D.   On: 10/19/2017 13:54   Anti-infectives (From admission, onward)   None        Assessment/Plan Alcohol abuse in remission - last alcohol intake 01/2017 HTN HLD GERD Congenital single kidney  Recurrent pancreatitis with unknown source  - Current episode of pancreatitis nearly resolved. IgG4 still pending to evaluate for possible autoimmune pancreatitis. If this is negative patient is agreeable to proceeding with laparoscopic cholecystectomy to rule out gallstones as cause of recurrent pancreatitis.   Patient understands that this is not an emergent surgery, so once the lab is back we will have to decide if we will proceed with surgery during this admission or if he will be discharged and surgery scheduled as an outpatient.   Will continue to follow.  ID - none VTE - SCDs FEN - IVF, CLD Foley - none  Brooke A  [place], [facility] Surgery 10/21/2017, 10:13 AM Pager:  [phone] Consults: [phone]  Agree with above.  If this is truly idiopathic recurrent pancreatitis, cholecystectomy is an option. Other option would be ursodeoxycholic acid (but this is usually used in people who are not surgical candidates).  Await workup of autoimmune pancreatitis.  Alphonsa Overall, MD, [facility] Surgery Pager: [phone] Office phone:  [phone]

## 2017-10-21 NOTE — Progress Notes (Signed)
[facility] PROGRESS NOTE  Kyle Berry OZY:[id number] DOB: [birth year] DOA: 10/18/2017 PCP: Kerin Perna, NP   Narrative: 47 year old male history of chronic pancreatitis?  Pancreatic divisum which abuse with abstinence X1 year, HTN HLD GERD congenital solitary kidney Admitted with hematemesis 2 episodes 7/5-he self reported to gastroenterology at the office 7 episodes? Note recent discharge from hospital 7/1 GI saw the patient in consult and recommended conservative management   A & Plan Hematemesis,?  Mallory-Weiss tear-appreciate GI input-diet graduated 7/8 for low-fat solids Pancreatic divisum, acute on chronic pancreatitis-lipase 70-patient to continue Creon Follow IgG4 is pending--I have briefly discussed the patient's case with surgeon Dr. Lucia Gaskins who will at least have a discussion about risk benefits and alternatives to elective cholecystectomy patient understands this will not be emergent Pain seems well controlled on current tramadol 50 every 8 and can use if severe Norco PPI changed as per GI to orals 7/8  Atypical chest pain in the past probably from GI issues  Hypertension continue Cardizem 120 daily-controlled  Solitary left kidney congenital      DVT prophylaxis: Lovenox code Status: Full code family Communication: None present at the bedside disposition Plan: Inpatient on Erline Levine, MD  [facility] Direct contact: [phone] --Via amion app OR  --[email or web address]; password [email or web address]  7PM-7AM contact night coverage as above 10/21/2017, 9:40 AM  LOS: 3 days   Consultants:  GI  General surgery  Procedures:  CT scan abdomen as below  Antimicrobials:  None at this time  Interval history/Subjective:  Mild episodic abdominal pain-walking around the unit  Objective:  Vitals:  Vitals:   10/20/17 2054 10/21/17 0521  BP: 137/88 122/79  Pulse: 66 (!) 53  Resp: 18 16  Temp: 98.5 F (36.9 C) 97.6 F (36.4 C)  SpO2: 99% 100%     Exam:  Pleasant awake alert oriented no distress looks younger than stated age no icterus no pallor dentition is good neck soft supple no thyromegaly Chest is clear without added sound abdomen is slightly soft in the epigastrium no rebound no lower extremity edema range of motion intact smile symmetric sensory intact  I have personally reviewed the following:   Labs:    Hemoglobin 11.8 MCV 102  Imaging studies:  CT scan done 7/6 does not show any complications of pancreatitis and only simple uncomplicated pancreatitis  Medical tests:  None  Test discussed with performing physician:  Discussed in person with Dr. Lucia Gaskins patient's case-patient will be not emergently evaluated  Decision to obtain old records:  Yes  Review and summation of old records:  Briefly reviewed  Scheduled Meds: . diltiazem  120 mg Oral Daily  . famotidine  20 mg Oral BID  . feeding supplement  1 Container Oral TID BM  . lipase/protease/amylase  36,000 Units Oral TID WC   Continuous Infusions:   Active Problems:   Essential hypertension   Acute on chronic pancreatitis (HCC)   Hematemesis   Malnutrition of moderate degree   LOS: 3 days

## 2017-10-21 NOTE — Progress Notes (Addendum)
[facility] Progress Note   Chief Complaint:   pancreatitis    SUBJECTIVE:    still has frequent periods of upper abdominal pain and nausea. On clears but it does increase discomfort and nausea to some degree   ASSESSMENT AND PLAN:   1. 47 yo male with recurrent acute pancreatitis (uncomplicated)of unclear etiology. No evidence for biliary etiology on MRCP.  IgG4 for autoimmune disease is pending.  If IgG4 is negative then consider for elective cholecystectomy for possible microlithiasis. Also to keep in mind, suggestion of pancreatitis divisum on MRCP.  -No vomiting. Can try low fat solid for lunch but may not tolerate.   #2. Hematemesis, likely Mallory Weiss tear. No hematemesis since admission hgb stable at 11.8.  -continue bid pepcid, will change to PO   OBJECTIVE:     Vital signs in last 24 hours: Temp:  [97.6 F (36.4 C)-98.7 F (37.1 C)] 97.6 F (36.4 C) (07/08 0521) Pulse Rate:  [53-66] 53 (07/08 0521) Resp:  [16-20] 16 (07/08 0521) BP: (120-137)/(79-90) 122/79 (07/08 0521) SpO2:  [98 %-100 %] 100 % (07/08 0521) Last BM Date: 10/19/17 General:   Alert, well-developed, male in NAD EENT:  Normal hearing, non icteric sclera, conjunctive pink.  Heart:  Regular rate and rhythm; no murmurs. No lower extremity edema Pulm: Normal respiratory effort, lungs CTA bilaterally without wheezes or crackles. Abdomen:  Soft, nondistended, mild epigastric tenderness.  Normal bowel sounds, no masses felt. No hepatomegaly.    Neurologic:  Alert and  oriented x4;  grossly normal neurologically. Psych:  Pleasant, cooperative.  Normal mood and affect.   Intake/Output from previous day: 07/07 0701 - 07/08 0700 In: 3193.7 [IV Piggyback:3193.7] Out: -  Intake/Output this shift: No intake/output data recorded.  Lab Results: Recent Labs    10/19/17 2132 10/20/17 0545 10/21/17 0603  WBC 5.5 5.0 5.0  HGB 11.6* 11.9* 11.8*  HCT 35.6* 37.2* 37.2*  PLT 238 260  271   BMET Recent Labs    10/18/17 1633 10/19/17 0628  NA 138 139  K 4.6 3.5  CL 103 104  CO2 26 27  GLUCOSE 95 90  BUN 12 8  CREATININE 1.22 1.17  CALCIUM 9.1 8.8*   LFT Recent Labs    10/19/17 0628  PROT 6.3*  ALBUMIN 3.1*  AST 33  ALT 47*  ALKPHOS 45  BILITOT 0.9   PT/INR Recent Labs    10/19/17 0235  LABPROT 14.2  INR 1.11   Hepatitis Panel No results for input(s): HEPBSAG, HCVAB, HEPAIGM, HEPBIGM in the last 72 hours.  Ct Abdomen Pelvis W Contrast  Result Date: 10/19/2017 CLINICAL DATA:  Recent diagnosis of pancreatitis with persistent abdominal pain. EXAM: CT ABDOMEN AND PELVIS WITH CONTRAST TECHNIQUE: Multidetector CT imaging of the abdomen and pelvis was performed using the standard protocol following bolus administration of intravenous contrast. CONTRAST:  177mL OMNIPAQUE IOHEXOL 300 MG/ML  SOLN COMPARISON:  CT abdomen pelvis-10/11/2017; 01/17/2017; 06/24/2016 FINDINGS: Lower chest: Limited visualization the lower thorax demonstrates minimal dependent subpleural atelectasis within the imaged left lower lobe. No focal airspace opacities. No pleural effusion. Borderline cardiomegaly.  No pericardial effusion. Hepatobiliary: Normal hepatic contour. Punctate subcentimeter hypoattenuating lesions within the dome of the right lobe of the liver (images 11 and 13, series 3) are too small to adequately characterize though similar to the 06/2016 examination and favored to represent hepatic cysts. Normal appearance of the gallbladder given degree distention. No radiopaque gallstones. No intra or extrahepatic biliary ductal dilatation. No ascites.  Pancreas: Grossly unchanged minimal amount of peripancreatic stranding (axial image 28, series 3; coronal image 37, series 6). There is homogeneous enhancement of the pancreatic parenchyma without evidence of pancreatic necrosis. No definitive pancreatic mass or pancreatic ductal dilatation on this non pancreatic protocol CT. Spleen:  Normal appearance of the spleen Adrenals/Urinary Tract: Re-demonstrated congenital atrophy of the right kidney. There is homogeneous enhancement of the left kidney. Unchanged punctate adjacent nonobstructing stones within inferior pole of the left kidney with dominant stone measuring approximately 8 mm (coronal image 46, series 6). No urinary obstruction or perinephric stranding. Note is made of an approximately 1.3 cm hypoattenuating nonenhancing cyst arising from the posterosuperior aspect the right kidney. No discrete left-sided renal lesions. Normal appearance of the bilateral adrenal glands. Normal appearance of the urinary bladder given degree distention. Stomach/Bowel: Ingested enteric contrast extends to the level the rectum. No evidence of enteric obstruction normal appearance of the terminal ileum and retrocecal appendix. No pneumoperitoneum, pneumatosis or portal venous gas. Vascular/Lymphatic: Normal caliber the abdominal aorta. The major branch vessels of the abdominal aorta appear widely patent. The splenic artery and SMA the both appear widely patent without evidence of vessel irregularity. No bulky retroperitoneal, mesenteric, pelvic or inguinal lymphadenopathy. Reproductive: Normal appearance of the pelvic organs. No free fluid in the pelvic cul-de-sac. Other: There is a minimal amount of subcutaneous edema about the midline of the low back. Musculoskeletal: No acute or aggressive osseous abnormalities. Moderate DDD of L5-S1 with disc space height loss, endplate irregularity and sclerosis. IMPRESSION: 1. Grossly unchanged findings compatible with acute uncomplicated pancreatitis. Specifically, no evidence pancreatic necrosis or definable/drainable pancreatic pseudocyst. 2. Nonobstructing left-sided nephrolithiasis. 3. 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 Electronically Signed   By: Sandi Mariscal M.D.   On: 10/19/2017 13:54     Active Problems:   Essential hypertension   Acute on  chronic pancreatitis ([place])   Hematemesis   Malnutrition of moderate degree     LOS: 3 days   Kyle Berry ,NP 10/21/2017, 9:08 AM    Attending physician's note   I have taken an interval history, reviewed the chart and examined the patient. I agree with the Advanced Practitioner's note, impression and recommendations.  Unclear etiology for recurrent acute pancreatitis.  Ig G4 pending.  Patient denies any active alcohol use.  No definitive gallstone on previous imaging but cannot exclude microlithiasis. 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 Denzil Magnuson , MD [phone]

## 2017-10-22 LAB — LIPASE, BLOOD: Lipase: 49 U/L (ref 11–51)

## 2017-10-22 MED ORDER — PROMETHAZINE HCL 25 MG/ML IJ SOLN
12.5000 mg | Freq: Four times a day (QID) | INTRAMUSCULAR | Status: DC | PRN
  Administered 2017-10-22: 12.5 mg via INTRAVENOUS
  Filled 2017-10-22: qty 1

## 2017-10-22 NOTE — Progress Notes (Addendum)
[facility] Progress Note     Subjective: CC-  No complaints. Denies any abdominal pain. Tolerating clear liquids. IgG4 negative for autoimmune pancreatitis. Patient would like to proceed with laparoscopic cholecystectomy to rule out gallstones as cause of recurrent pancreatitis.  Objective: Vital signs in last 24 hours: Temp:  [97.9 F (36.6 C)-98.4 F (36.9 C)] 98.4 F (36.9 C) (07/09 0447) Pulse Rate:  [60-65] 60 (07/09 0447) Resp:  [17-18] 17 (07/09 0447) BP: (115-147)/(81-92) 115/81 (07/09 0447) SpO2:  [98 %-100 %] 98 % (07/09 0447) Last BM Date: 10/21/17  Intake/Output from previous day: 07/08 0701 - 07/09 0700 In: 120 [P.O.:120] Out: -  Intake/Output this shift: No intake/output data recorded.  PE: Gen:  Alert, NAD, pleasant HEENT: EOM's intact, pupils equal and round Card:  RRR, no M/G/R heard Pulm:  CTAB, no W/R/R, effort normal Abd: Soft, NT/ND, +BS, no HSM, no hernia Ext: calves soft and nontender Psych: A&Ox3  Skin: no rashes noted, warm and dry  Lab Results:  Recent Labs    10/20/17 0545 10/21/17 0603  WBC 5.0 5.0  HGB 11.9* 11.8*  HCT 37.2* 37.2*  PLT 260 271   BMET No results for input(s): NA, K, CL, CO2, GLUCOSE, BUN, CREATININE, CALCIUM in the last 72 hours. PT/INR No results for input(s): LABPROT, INR in the last 72 hours. CMP     Component Value Date/Time   NA 139 10/19/2017 0628   K 3.5 10/19/2017 0628   CL 104 10/19/2017 0628   CO2 27 10/19/2017 0628   GLUCOSE 90 10/19/2017 0628   BUN 8 10/19/2017 0628   CREATININE 1.17 10/19/2017 0628   CALCIUM 8.8 (L) 10/19/2017 0628   PROT 6.3 (L) 10/19/2017 0628   ALBUMIN 3.1 (L) 10/19/2017 0628   AST 33 10/19/2017 0628   ALT 47 (H) 10/19/2017 0628   ALKPHOS 45 10/19/2017 0628   BILITOT 0.9 10/19/2017 0628   GFRNONAA >60 10/19/2017 0628   GFRAA >60 10/19/2017 0628   Lipase     Component Value Date/Time   LIPASE 70 (H) 10/20/2017 0545       Studies/Results: No  results found.  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 Again discussed risks and benefits of laparoscopic cholecystectomy, including gallstones may not be the source of his pancreatitis and he could have recurrent episodes, but patient would like to proceed.   Unsure of timing of surgery, but will make patient NPO after MN in case we are able to proceed tomorrow.  ID - none VTE - SCDs FEN - IVF, CLD, NPO after MN Foley - none   LOS: 4 days    Wellington Hampshire , [facility] 10/22/2017, 10:56 AM Pager: [phone] Consults: [phone] Mon 7:00 am -11:30 AM  Agree with above. Will tentatively schedule lap chole for tomorrow. He understands to limits of surgery regarding his pancreatitis. I discussed with the patient the indications and risks of gall bladder surgery.  The primary risks of gall bladder surgery include, but are not limited to, bleeding, infection, common bile duct injury, and open surgery.  There is also the risk that the patient may have continued symptoms after surgery.  We discussed the typical post-operative recovery course. I tried to answer the patient's  questions.  I explained to the patient that emergencies could delay surgery.  His mother and daughter were in the room with him.  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  Alphonsa Overall, MD, [facility] Pager: [phone] Office phone:  [phone]

## 2017-10-22 NOTE — H&P (View-Only) (Signed)
[facility] Progress Note     Subjective: CC-  No complaints. Denies any abdominal pain. Tolerating clear liquids. IgG4 negative for autoimmune pancreatitis. Patient would like to proceed with laparoscopic cholecystectomy to rule out gallstones as cause of recurrent pancreatitis.  Objective: Vital signs in last 24 hours: Temp:  [97.9 F (36.6 C)-98.4 F (36.9 C)] 98.4 F (36.9 C) (07/09 0447) Pulse Rate:  [60-65] 60 (07/09 0447) Resp:  [17-18] 17 (07/09 0447) BP: (115-147)/(81-92) 115/81 (07/09 0447) SpO2:  [98 %-100 %] 98 % (07/09 0447) Last BM Date: 10/21/17  Intake/Output from previous day: 07/08 0701 - 07/09 0700 In: 120 [P.O.:120] Out: -  Intake/Output this shift: No intake/output data recorded.  PE: Gen:  Alert, NAD, pleasant HEENT: EOM's intact, pupils equal and round Card:  RRR, no M/G/R heard Pulm:  CTAB, no W/R/R, effort normal Abd: Soft, NT/ND, +BS, no HSM, no hernia Ext: calves soft and nontender Psych: A&Ox3  Skin: no rashes noted, warm and dry  Lab Results:  Recent Labs    10/20/17 0545 10/21/17 0603  WBC 5.0 5.0  HGB 11.9* 11.8*  HCT 37.2* 37.2*  PLT 260 271   BMET No results for input(s): NA, K, CL, CO2, GLUCOSE, BUN, CREATININE, CALCIUM in the last 72 hours. PT/INR No results for input(s): LABPROT, INR in the last 72 hours. CMP     Component Value Date/Time   NA 139 10/19/2017 0628   K 3.5 10/19/2017 0628   CL 104 10/19/2017 0628   CO2 27 10/19/2017 0628   GLUCOSE 90 10/19/2017 0628   BUN 8 10/19/2017 0628   CREATININE 1.17 10/19/2017 0628   CALCIUM 8.8 (L) 10/19/2017 0628   PROT 6.3 (L) 10/19/2017 0628   ALBUMIN 3.1 (L) 10/19/2017 0628   AST 33 10/19/2017 0628   ALT 47 (H) 10/19/2017 0628   ALKPHOS 45 10/19/2017 0628   BILITOT 0.9 10/19/2017 0628   GFRNONAA >60 10/19/2017 0628   GFRAA >60 10/19/2017 0628   Lipase     Component Value Date/Time   LIPASE 70 (H) 10/20/2017 0545       Studies/Results: No  results found.  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 Again discussed risks and benefits of laparoscopic cholecystectomy, including gallstones may not be the source of his pancreatitis and he could have recurrent episodes, but patient would like to proceed.   Unsure of timing of surgery, but will make patient NPO after MN in case we are able to proceed tomorrow.  ID - none VTE - SCDs FEN - IVF, CLD, NPO after MN Foley - none   LOS: 4 days    Wellington Hampshire , [facility] 10/22/2017, 10:56 AM Pager: [phone] Consults: [phone] Mon 7:00 am -11:30 AM  Agree with above. Will tentatively schedule lap chole for tomorrow. He understands to limits of surgery regarding his pancreatitis. I discussed with the patient the indications and risks of gall bladder surgery.  The primary risks of gall bladder surgery include, but are not limited to, bleeding, infection, common bile duct injury, and open surgery.  There is also the risk that the patient may have continued symptoms after surgery.  We discussed the typical post-operative recovery course. I tried to answer the patient's  questions.  I explained to the patient that emergencies could delay surgery.  His mother and daughter were in the room with him.  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  Alphonsa Overall, MD, [facility] Surgery Pager: [phone] Office phone:  [phone]

## 2017-10-22 NOTE — Progress Notes (Signed)
Nutrition Follow-up  DOCUMENTATION CODES:   Non-severe (moderate) malnutrition in context of acute illness/injury  INTERVENTION:   -Continue Boost Breeze po TID, each supplement provides 250 kcal and 9 grams of protein -MVI with minerals daily -RD will follow for diet advancement and supplement as appropriate; consider initiation of nutrition support if prolonged NPO/clear liquid diet status is anticipated  NUTRITION DIAGNOSIS:   Moderate Malnutrition related to acute illness(pancreatitis exacerbation) as evidenced by mild fat depletion, moderate fat depletion, mild muscle depletion, moderate muscle depletion.  Ongoing  GOAL:   Patient will meet greater than or equal to 90% of their needs  Progressing  MONITOR:   PO intake, Weight trends, Supplement acceptance, Labs, Diet advancement  REASON FOR ASSESSMENT:   Malnutrition Screening Tool    ASSESSMENT:   47 year old male who presented to the ED with hematemesis. Pt discharged from the hospital on 7/1 for pancreatitis. PMH significant for prior EtOH abuse in remission, GERD, hypertension, and recurrent pancreatitis. Pt admitted for chronic pancreatitis exacerbation and hematemesis suspect secondary to Mallory-Weiss tear.  7/9- IGI4 negative for autoimmune pancreatitis  Spoke with pt, who was sitting up in the bed and in good spirits today. He reports his nausea and abdominal pain has improved greatly since adjusting nausea medications. He reports he still gets some abdominal pain while eating, but is tolerating clear liquids well. He really enjoys the [employer] supplements and is agreeable to continue to consume them. He is eager for diet advancement as medically appropriate.   Per general surgery notes, pt has consented to lap chole, which may occur as early as tomorrow.  Labs reviewed.   Diet Order:   Diet Order           Diet NPO time specified  Diet effective midnight        Diet clear liquid Room service  appropriate? Yes; Fluid consistency: Thin  Diet effective now          EDUCATION NEEDS:   No education needs have been identified at this time  Skin:  Skin Assessment: Reviewed RN Assessment  Last BM:  10/21/17  Height:   Ht Readings from Last 1 Encounters:  10/18/17 6\' 1"  (1.854 m)    Weight:   Wt Readings from Last 1 Encounters:  10/18/17 169 lb 12.1 oz (77 kg)    Ideal Body Weight:  83.64 kg  BMI:  Body mass index is 22.4 kg/m.  Estimated Nutritional Needs:   Kcal:  2200-2400 kcal/day  Protein:  105-120 grams/day  Fluid:  >/= 2.2 L/day    Rhys Anchondo A. Jimmye Norman, RD, LDN, CDE Pager: [phone] After hours Pager: [phone]

## 2017-10-22 NOTE — Plan of Care (Signed)

## 2017-10-22 NOTE — Progress Notes (Signed)
[facility] PROGRESS NOTE  EMIEL KIELTY [id number] DOB: Oct 29, 1970 DOA: 10/18/2017 PCP: Kerin Perna, NP   Narrative: 47 year old male history of chronic pancreatitis?  Pancreatic divisum which abuse with abstinence X1 year, HTN HLD GERD congenital solitary kidney Admitted with hematemesis 2 episodes 7/5-he self reported to gastroenterology at the office 7 episodes? Note recent discharge from hospital 7/1 GI saw the patient in consult and recommended conservative management Has history of pancreatic divisum and also had a negative IgG   A & Plan Hematemesis,?  Mallory-Weiss tear-appreciate GI input-diet graduated 7/8 for low-fat solids Pancreatic divisum, acute on chronic pancreatitis IgG4 negative-tentative surgery 7/10? Pain seems well controlled on current tramadol 50 every 8 Norco causing nausea so discontinued 7/9 PPI changed as per GI to orals 7/8  Atypical chest pain in the past probably from GI issues  Hypertension continue Cardizem 120 daily-controlled  Solitary left kidney congenital      DVT prophylaxis: Lovenox code Status: Full code family Communication: None present at the bedside disposition Plan: Inpatient on MedSurg-await surgery   Ailani Governale, MD  [facility] Direct contact: [phone] --Via amion app OR  --[email or web address]; password [email or web address]  7PM-7AM contact night coverage as above 10/22/2017, 1:54 PM  LOS: 4 days   Consultants:  GI  General surgery  Procedures:  CT scan abdomen as below  Antimicrobials:  None at this time  Interval history/Subjective:  Nausea related to Vicodin-ambulatory-tolerating diet but some mild pain-tramadol helps-no fever no chills no further hematemesis  Objective:  Vitals:  Vitals:   10/22/17 0447 10/22/17 1351  BP: 115/81 128/82  Pulse: 60 (!) 59  Resp: 17 18  Temp: 98.4 F (36.9 C) 98 F (36.7 C)  SpO2: 98% 100%    Exam:  Pleasant awake alert oriented no distress looks younger  than stated age no icterus no pallor dentition is good neck soft supple no thyromegaly Chest is clear without added sound abdomen is slightly soft in the epigastrium no rebound no lower extremity edema range of motion intact smile symmetric sensory intact  I have personally reviewed the following:   Labs:  No labs today  Imaging studies:  CT scan done 7/6 does not show any complications of pancreatitis and only simple uncomplicated pancreatitis  Medical tests:  None  Test discussed with performing physician:  n  Decision to obtain old records:  Yes  Review and summation of old records:  Briefly reviewed  Scheduled Meds: . diltiazem  120 mg Oral Daily  . famotidine  20 mg Oral BID  . feeding supplement  1 Container Oral TID BM  . lipase/protease/amylase  36,000 Units Oral TID WC   Continuous Infusions:   Active Problems:   Essential hypertension   Acute on chronic pancreatitis (H[facility])   Hematemesis   Malnutrition of moderate degree   LOS: 4 days

## 2017-10-22 NOTE — Progress Notes (Addendum)
     [facility] Progress Note   Chief Complaint:   pancreatitis    SUBJECTIVE:   Still having intermittent nausea (thinks it is Vicodin related). Still having intermittent abdominal pain  ASSESSMENT AND PLAN:   Recurrent acute pancreatitis, uncomplicated. Etiology unclear. IgG4 normal. Microlithiasis? Secondary to PD.  -At this point he will hopefully benefit from empirical cholecystectomy. Appreciate Surgery's assistance.    OBJECTIVE:     Vital signs in last 24 hours: Temp:  [97.9 F (36.6 C)-98.4 F (36.9 C)] 98.4 F (36.9 C) (07/09 0447) Pulse Rate:  [60-65] 60 (07/09 0447) Resp:  [17-18] 17 (07/09 0447) BP: (115-147)/(81-92) 115/81 (07/09 0447) SpO2:  [98 %-100 %] 98 % (07/09 0447) Last BM Date: 10/19/17 General:   Alert, well-developed, male in NAD EENT:  Normal hearing, non icteric sclera, conjunctive pink.  Heart:  Regular rate and rhythm; no murmurs. No lower extremity edema Pulm: Normal respiratory effort, lungs CTA bilaterally without wheezes or crackles. Abdomen:  Soft, nondistended, nontender.  Normal bowel sounds, no masses felt. No hepatomegaly.    Neurologic:  Alert and  oriented x4;  grossly normal neurologically. Psych:  Pleasant, cooperative.  Normal mood and affect.   Intake/Output from previous day: 07/08 0701 - 07/09 0700 In: 120 [P.O.:120] Out: -  Intake/Output this shift: No intake/output data recorded.  Lab Results: Recent Labs    10/19/17 2132 10/20/17 0545 10/21/17 0603  WBC 5.5 5.0 5.0  HGB 11.6* 11.9* 11.8*  HCT 35.6* 37.2* 37.2*  PLT 238 260 271     Active Problems:   Essential hypertension   Acute on chronic pancreatitis (HCC)   Hematemesis   Malnutrition of moderate degree     LOS: 4 days   Tye Savoy ,NP 10/22/2017, 9:38 AM     Attending physician's note   I have taken an interval history, reviewed the chart and examined the patient. I agree with the Advanced Practitioner's note, impression  and recommendations.  Normal IgG4. Idiopathic recurrent pancreatitis.  Plan for cholecystectomy by surgery Please call if have any questions.    Damaris Hippo , MD [phone]

## 2017-10-23 ENCOUNTER — Encounter (HOSPITAL_COMMUNITY): Payer: Self-pay | Admitting: Anesthesiology

## 2017-10-23 ENCOUNTER — Encounter (HOSPITAL_COMMUNITY)
Admission: EM | Disposition: A | Discharge status: Home / Self Care | Payer: Self-pay | Source: Home / Self Care | Attending: Family Medicine

## 2017-10-23 ENCOUNTER — Inpatient Hospital Stay (HOSPITAL_COMMUNITY): Payer: Managed Care, Other (non HMO) | Admitting: Anesthesiology

## 2017-10-23 ENCOUNTER — Inpatient Hospital Stay (HOSPITAL_COMMUNITY): Payer: Managed Care, Other (non HMO)

## 2017-10-23 HISTORY — PX: CHOLECYSTECTOMY: SHX55

## 2017-10-23 LAB — CBC
HCT: 39.1 % (ref 39.0–52.0)
HEMOGLOBIN: 12.7 g/dL — AB (ref 13.0–17.0)
MCH: 32.5 pg (ref 26.0–34.0)
MCHC: 32.5 g/dL (ref 30.0–36.0)
MCV: 100 fL (ref 78.0–100.0)
PLATELETS: 350 10*3/uL (ref 150–400)
RBC: 3.91 MIL/uL — ABNORMAL LOW (ref 4.22–5.81)
RDW: 13.5 % (ref 11.5–15.5)
WBC: 13.1 10*3/uL — ABNORMAL HIGH (ref 4.0–10.5)

## 2017-10-23 LAB — CREATININE, SERUM
CREATININE: 1.17 mg/dL (ref 0.61–1.24)
GFR calc Af Amer: >60 mL/min (ref >60)
GFR calc non Af Amer: >60 mL/min (ref >60)

## 2017-10-23 LAB — SURGICAL PCR SCREEN
MRSA, PCR: NEGATIVE
Staphylococcus aureus: POSITIVE — AB

## 2017-10-23 LAB — LIPASE, BLOOD: Lipase: 38 U/L (ref 11–51)

## 2017-10-23 SURGERY — LAPAROSCOPIC CHOLECYSTECTOMY WITH INTRAOPERATIVE CHOLANGIOGRAM
Anesthesia: General | Site: Abdomen

## 2017-10-23 MED ORDER — DIPHENHYDRAMINE HCL 50 MG/ML IJ SOLN: 25.0000 mg | Freq: Four times a day (QID) | INTRAMUSCULAR | Status: DC | PRN

## 2017-10-23 MED ORDER — LIDOCAINE HCL (CARDIAC) PF 100 MG/5ML IV SOSY
PREFILLED_SYRINGE | INTRAVENOUS | Status: DC | PRN
  Administered 2017-10-23: 100 mg via INTRAVENOUS

## 2017-10-23 MED ORDER — SUGAMMADEX SODIUM 200 MG/2ML IV SOLN
INTRAVENOUS | Status: AC
  Filled 2017-10-23: qty 2

## 2017-10-23 MED ORDER — PROMETHAZINE HCL 25 MG/ML IJ SOLN
12.5000 mg | Freq: Four times a day (QID) | INTRAMUSCULAR | Status: DC | PRN
  Administered 2017-10-23 – 2017-10-24 (×2): 12.5 mg via INTRAVENOUS
  Filled 2017-10-23 (×2): qty 1

## 2017-10-23 MED ORDER — CEFTRIAXONE SODIUM 1 G IJ SOLR
1.0000 g | INTRAMUSCULAR | Status: AC
  Administered 2017-10-23: 1 g via INTRAVENOUS
  Filled 2017-10-23 (×2): qty 10

## 2017-10-23 MED ORDER — ACETAMINOPHEN 325 MG PO TABS: 325.0000 mg | ORAL_TABLET | ORAL | Status: DC | PRN

## 2017-10-23 MED ORDER — ROCURONIUM BROMIDE 100 MG/10ML IV SOLN
INTRAVENOUS | Status: DC | PRN
  Administered 2017-10-23: 50 mg via INTRAVENOUS

## 2017-10-23 MED ORDER — LABETALOL HCL 5 MG/ML IV SOLN
INTRAVENOUS | Status: DC | PRN
  Administered 2017-10-23: 5 mg via INTRAVENOUS

## 2017-10-23 MED ORDER — ROCURONIUM BROMIDE 100 MG/10ML IV SOLN
INTRAVENOUS | Status: AC
  Filled 2017-10-23: qty 1

## 2017-10-23 MED ORDER — ONDANSETRON HCL 4 MG/2ML IJ SOLN: 4.0000 mg | Freq: Once | INTRAMUSCULAR | Status: DC | PRN

## 2017-10-23 MED ORDER — MORPHINE SULFATE (PF) 2 MG/ML IV SOLN
1.0000 mg | INTRAVENOUS | Status: DC | PRN
  Administered 2017-10-23: 1 mg via INTRAVENOUS
  Filled 2017-10-23: qty 1

## 2017-10-23 MED ORDER — FAMOTIDINE IN NACL 20-0.9 MG/50ML-% IV SOLN
20.0000 mg | Freq: Two times a day (BID) | INTRAVENOUS | Status: DC
  Administered 2017-10-23: 20 mg via INTRAVENOUS
  Filled 2017-10-23: qty 50

## 2017-10-23 MED ORDER — 0.9 % SODIUM CHLORIDE (POUR BTL) OPTIME
TOPICAL | Status: DC | PRN
Start: 2017-10-23 — End: 2017-10-23
  Administered 2017-10-23: 1000 mL

## 2017-10-23 MED ORDER — ONDANSETRON HCL 4 MG/2ML IJ SOLN
INTRAMUSCULAR | Status: AC
  Filled 2017-10-23: qty 2

## 2017-10-23 MED ORDER — BUPIVACAINE LIPOSOME 1.3 % IJ SUSP
20.0000 mL | Freq: Once | INTRAMUSCULAR | Status: AC
  Administered 2017-10-23: 20 mL
  Filled 2017-10-23: qty 20

## 2017-10-23 MED ORDER — MIDAZOLAM HCL 5 MG/5ML IJ SOLN
INTRAMUSCULAR | Status: DC | PRN
  Administered 2017-10-23: 2 mg via INTRAVENOUS

## 2017-10-23 MED ORDER — KCL IN DEXTROSE-NACL 20-5-0.45 MEQ/L-%-% IV SOLN
INTRAVENOUS | Status: DC
  Administered 2017-10-23 – 2017-10-24 (×2): via INTRAVENOUS
  Filled 2017-10-23 (×2): qty 1000

## 2017-10-23 MED ORDER — ONDANSETRON HCL 4 MG/2ML IJ SOLN
4.0000 mg | Freq: Four times a day (QID) | INTRAMUSCULAR | Status: DC | PRN
  Administered 2017-10-23: 4 mg via INTRAVENOUS
  Filled 2017-10-23: qty 2

## 2017-10-23 MED ORDER — CHLORHEXIDINE GLUCONATE CLOTH 2 % EX PADS
6.0000 | MEDICATED_PAD | Freq: Every day | CUTANEOUS | Status: DC
  Administered 2017-10-23: 6 via TOPICAL

## 2017-10-23 MED ORDER — PROPOFOL 10 MG/ML IV BOLUS
INTRAVENOUS | Status: AC
  Filled 2017-10-23: qty 20

## 2017-10-23 MED ORDER — CEFTRIAXONE SODIUM 2 G IJ SOLR
2.0000 g | INTRAMUSCULAR | Status: DC
  Administered 2017-10-23: 2 g via INTRAVENOUS
  Filled 2017-10-23: qty 20
  Filled 2017-10-23: qty 2

## 2017-10-23 MED ORDER — HYDROCODONE-ACETAMINOPHEN 5-325 MG PO TABS
1.0000 | ORAL_TABLET | ORAL | Status: DC | PRN
  Administered 2017-10-23 – 2017-10-24 (×3): 2 via ORAL
  Filled 2017-10-23 (×3): qty 2

## 2017-10-23 MED ORDER — ONDANSETRON 4 MG PO TBDP: 4.0000 mg | ORAL_TABLET | Freq: Four times a day (QID) | ORAL | Status: DC | PRN

## 2017-10-23 MED ORDER — ACETAMINOPHEN 160 MG/5ML PO SOLN: 325.0000 mg | ORAL | Status: DC | PRN

## 2017-10-23 MED ORDER — DIPHENHYDRAMINE HCL 25 MG PO CAPS
25.0000 mg | ORAL_CAPSULE | Freq: Four times a day (QID) | ORAL | Status: DC | PRN
  Administered 2017-10-24: 25 mg via ORAL
  Filled 2017-10-23: qty 1

## 2017-10-23 MED ORDER — MEPERIDINE HCL 50 MG/ML IJ SOLN: 6.2500 mg | INTRAMUSCULAR | Status: DC | PRN

## 2017-10-23 MED ORDER — IOPAMIDOL (ISOVUE-300) INJECTION 61%
INTRAVENOUS | Status: AC
  Filled 2017-10-23: qty 50

## 2017-10-23 MED ORDER — FENTANYL CITRATE (PF) 100 MCG/2ML IJ SOLN
25.0000 ug | INTRAMUSCULAR | Status: DC | PRN
Start: 2017-10-23 — End: 2017-10-23

## 2017-10-23 MED ORDER — PROPOFOL 10 MG/ML IV BOLUS
INTRAVENOUS | Status: DC | PRN
  Administered 2017-10-23: 180 mg via INTRAVENOUS
  Administered 2017-10-23: 20 mg via INTRAVENOUS

## 2017-10-23 MED ORDER — OXYCODONE HCL 5 MG/5ML PO SOLN
5.0000 mg | Freq: Once | ORAL | Status: DC | PRN
  Filled 2017-10-23: qty 5

## 2017-10-23 MED ORDER — SUGAMMADEX SODIUM 200 MG/2ML IV SOLN
INTRAVENOUS | Status: DC | PRN
  Administered 2017-10-23: 200 mg via INTRAVENOUS

## 2017-10-23 MED ORDER — IOPAMIDOL (ISOVUE-300) INJECTION 61%
INTRAVENOUS | Status: DC | PRN
  Administered 2017-10-23: 5 mL

## 2017-10-23 MED ORDER — FENTANYL CITRATE (PF) 250 MCG/5ML IJ SOLN
INTRAMUSCULAR | Status: AC
  Filled 2017-10-23: qty 5

## 2017-10-23 MED ORDER — LACTATED RINGERS IR SOLN
Status: DC | PRN
  Administered 2017-10-23: 1000 mL

## 2017-10-23 MED ORDER — LACTATED RINGERS IV SOLN
INTRAVENOUS | Status: DC
  Administered 2017-10-23: 11:00:00 via INTRAVENOUS

## 2017-10-23 MED ORDER — DEXAMETHASONE SODIUM PHOSPHATE 10 MG/ML IJ SOLN
INTRAMUSCULAR | Status: DC | PRN
  Administered 2017-10-23: 10 mg via INTRAVENOUS

## 2017-10-23 MED ORDER — HEPARIN SODIUM (PORCINE) 5000 UNIT/ML IJ SOLN
5000.0000 [IU] | Freq: Three times a day (TID) | INTRAMUSCULAR | Status: DC
  Administered 2017-10-23 – 2017-10-24 (×2): 5000 [IU] via SUBCUTANEOUS
  Filled 2017-10-23 (×2): qty 1

## 2017-10-23 MED ORDER — FENTANYL CITRATE (PF) 100 MCG/2ML IJ SOLN
INTRAMUSCULAR | Status: DC | PRN
  Administered 2017-10-23 (×2): 50 ug via INTRAVENOUS
  Administered 2017-10-23: 100 ug via INTRAVENOUS
  Administered 2017-10-23: 50 ug via INTRAVENOUS

## 2017-10-23 MED ORDER — ONDANSETRON HCL 4 MG/2ML IJ SOLN
INTRAMUSCULAR | Status: DC | PRN
  Administered 2017-10-23: 4 mg via INTRAVENOUS

## 2017-10-23 MED ORDER — OXYCODONE HCL 5 MG PO TABS: 5.0000 mg | ORAL_TABLET | Freq: Once | ORAL | Status: DC | PRN

## 2017-10-23 MED ORDER — MUPIROCIN 2 % EX OINT
1.0000 "application " | TOPICAL_OINTMENT | Freq: Two times a day (BID) | CUTANEOUS | Status: DC
  Administered 2017-10-23: 1 via NASAL
  Filled 2017-10-23: qty 22

## 2017-10-23 MED ORDER — MIDAZOLAM HCL 2 MG/2ML IJ SOLN
INTRAMUSCULAR | Status: AC
  Filled 2017-10-23: qty 2

## 2017-10-23 SURGICAL SUPPLY — 38 items
ADH SKN CLS APL DERMABOND .7 (GAUZE/BANDAGES/DRESSINGS) ×1
APL SKNCLS STERI-STRIP NONHPOA (GAUZE/BANDAGES/DRESSINGS)
APPLIER CLIP ROT 10 11.4 M/L (STAPLE) ×2
APR CLP MED LRG 11.4X10 (STAPLE) ×1
BAG SPEC RTRVL 10 TROC 200 (ENDOMECHANICALS) ×1
BENZOIN TINCTURE PRP APPL 2/3 (GAUZE/BANDAGES/DRESSINGS) IMPLANT
CABLE HIGH FREQUENCY MONO STRZ (ELECTRODE) ×2 IMPLANT
CATH REDDICK CHOLANGI 4FR 50CM (CATHETERS) ×2 IMPLANT
CLIP APPLIE ROT 10 11.4 M/L (STAPLE) ×1 IMPLANT
COVER MAYO STAND STRL (DRAPES) ×2 IMPLANT
COVER SURGICAL LIGHT HANDLE (MISCELLANEOUS) ×2 IMPLANT
DECANTER SPIKE VIAL GLASS SM (MISCELLANEOUS) ×2 IMPLANT
DERMABOND ADVANCED (GAUZE/BANDAGES/DRESSINGS) ×1
DERMABOND ADVANCED .7 DNX12 (GAUZE/BANDAGES/DRESSINGS) ×1 IMPLANT
DRAPE C-ARM 42X120 X-RAY (DRAPES) ×2 IMPLANT
ELECT PENCIL ROCKER SW 15FT (MISCELLANEOUS) IMPLANT
ELECT REM PT RETURN 15FT ADLT (MISCELLANEOUS) ×2 IMPLANT
GLOVE BIOGEL M 8.0 STRL (GLOVE) ×2 IMPLANT
GOWN STRL REUS W/TWL XL LVL3 (GOWN DISPOSABLE) ×6 IMPLANT
HEMOSTAT SURGICEL 4X8 (HEMOSTASIS) IMPLANT
IV CATH 14GX2 1/4 (CATHETERS) ×2 IMPLANT
KIT BASIN OR (CUSTOM PROCEDURE TRAY) ×2 IMPLANT
L-HOOK LAP DISP 36CM (ELECTROSURGICAL) ×2
LHOOK LAP DISP 36CM (ELECTROSURGICAL) IMPLANT
POUCH RETRIEVAL ECOSAC 10 (ENDOMECHANICALS) IMPLANT
POUCH RETRIEVAL ECOSAC 10MM (ENDOMECHANICALS) ×1
SCISSORS LAP 5X45 EPIX DISP (ENDOMECHANICALS) ×2 IMPLANT
SET IRRIG TUBING LAPAROSCOPIC (IRRIGATION / IRRIGATOR) ×2 IMPLANT
SLEEVE XCEL OPT CAN 5 100 (ENDOMECHANICALS) ×2 IMPLANT
STRIP CLOSURE SKIN 1/2X4 (GAUZE/BANDAGES/DRESSINGS) IMPLANT
SUT MNCRL AB 4-0 PS2 18 (SUTURE) ×2 IMPLANT
SYR 20CC LL (SYRINGE) ×2 IMPLANT
TOWEL OR 17X26 10 PK STRL BLUE (TOWEL DISPOSABLE) ×2 IMPLANT
TRAY LAPAROSCOPIC (CUSTOM PROCEDURE TRAY) ×2 IMPLANT
TROCAR BLADELESS OPT 5 100 (ENDOMECHANICALS) ×2 IMPLANT
TROCAR XCEL BLUNT TIP 100MML (ENDOMECHANICALS) IMPLANT
TROCAR XCEL NON-BLD 11X100MML (ENDOMECHANICALS) ×2 IMPLANT
TUBING INSUF HEATED (TUBING) ×2 IMPLANT

## 2017-10-23 NOTE — Progress Notes (Signed)
Patient transferring to [facility] for surgery, report given to [facility], Therapist, sports at [employer].

## 2017-10-23 NOTE — Anesthesia Preprocedure Evaluation (Addendum)
Anesthesia Evaluation  Patient identified by MRN, date of birth, ID band Patient awake    Reviewed: Allergy & Precautions, H&P , NPO status , Patient's Chart, lab work & pertinent test results, reviewed documented beta blocker date and time   Airway Mallampati: II  TM Distance: >3 FB Neck ROM: full    Dental no notable dental hx. (+) Poor Dentition, Missing, Chipped,    Pulmonary    Pulmonary exam normal breath sounds clear to auscultation       Cardiovascular Exercise Tolerance: Good hypertension, Pt. on medications negative cardio ROS   Rhythm:regular Rate:Normal     Neuro/Psych  Headaches,    GI/Hepatic GERD  Medicated,(+)     substance abuse  , Medical history significant of chronic pancreatitis  alcohol abuse in remission   Endo/Other    Renal/GU Renal diseasecongenital single kidney     Musculoskeletal   Abdominal   Peds  Hematology   Anesthesia Other Findings   Reproductive/Obstetrics                            Anesthesia Physical Anesthesia Plan  ASA: II  Anesthesia Plan: General   Post-op Pain Management:    Induction: Intravenous  PONV Risk Score and Plan: 2 and Ondansetron, Dexamethasone and Treatment may vary due to age or medical condition  Airway Management Planned: Oral ETT  Additional Equipment:   Intra-op Plan:   Post-operative Plan: Extubation in OR  Informed Consent: I have reviewed the patients History and Physical, chart, labs and discussed the procedure including the risks, benefits and alternatives for the proposed anesthesia with the patient or authorized representative who has indicated his/her understanding and acceptance.   Dental Advisory Given  Plan Discussed with: CRNA, Anesthesiologist and Surgeon  Anesthesia Plan Comments: (  )        Anesthesia Quick Evaluation

## 2017-10-23 NOTE — Progress Notes (Addendum)
  [facility] Progress Note     Subjective: CC-  No complaints. Abdominal pain resolved. Denies n/v. Tolerated clear liquids yesterday.  Planning for laparoscopic cholecystectomy to rule out gallstones as cause of recurrent pancreatitis.  Objective: Vital signs in last 24 hours: Temp:  [97.9 F (36.6 C)-98.4 F (36.9 C)] 97.9 F (36.6 C) (07/10 0518) Pulse Rate:  [59-64] 59 (07/10 0518) Resp:  [16-18] 16 (07/10 0518) BP: (128-134)/(79-88) 134/88 (07/10 0518) SpO2:  [98 %-100 %] 100 % (07/10 0518) Last BM Date: 10/21/17  Intake/Output from previous day: 07/09 0701 - 07/10 0700 In: 720 [P.O.:720] Out: -  Intake/Output this shift: No intake/output data recorded.  PE: Gen:  Alert, NAD, pleasant HEENT: EOM's intact, pupils equal and round Card:  RRR, no M/G/R heard Pulm:  CTAB, no W/R/R, effort normal Abd: Soft, NT/ND, +BS, no HSM, no hernia Ext: calves soft and nontender Psych: A&Ox3  Skin: no rashes noted, warm and dry  Lab Results:  Recent Labs    10/21/17 0603  WBC 5.0  HGB 11.8*  HCT 37.2*  PLT 271   BMET No results for input(s): NA, K, CL, CO2, GLUCOSE, BUN, CREATININE, CALCIUM in the last 72 hours. PT/INR No results for input(s): LABPROT, INR in the last 72 hours. CMP     Component Value Date/Time   NA 139 10/19/2017 0628   K 3.5 10/19/2017 0628   CL 104 10/19/2017 0628   CO2 27 10/19/2017 0628   GLUCOSE 90 10/19/2017 0628   BUN 8 10/19/2017 0628   CREATININE 1.17 10/19/2017 0628   CALCIUM 8.8 (L) 10/19/2017 0628   PROT 6.3 (L) 10/19/2017 0628   ALBUMIN 3.1 (L) 10/19/2017 0628   AST 33 10/19/2017 0628   ALT 47 (H) 10/19/2017 0628   ALKPHOS 45 10/19/2017 0628   BILITOT 0.9 10/19/2017 0628   GFRNONAA >60 10/19/2017 0628   GFRAA >60 10/19/2017 0628   Lipase     Component Value Date/Time   LIPASE 49 10/22/2017 1117       Studies/Results: No results found.  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 Planning to transfer patient to [facility] today for laparoscopic cholecystectomy today.  ID -none VTE -SCDs FEN -IVF, NPO Foley -none   LOS: 5 days    Wellington Hampshire , [facility] 10/23/2017, 9:52 AM Pager: [phone] Consults: [phone]  Agree with above. I have discussed patient with Dr. Hassell Done who has graciously agreed to do lap chole.  This clearly speeds up this patient's hospitalization. Will arrange transfer to [facility].  Alphonsa Overall, MD, [facility] Pager: [phone] Office phone:  [phone]

## 2017-10-23 NOTE — Anesthesia Procedure Notes (Signed)

## 2017-10-23 NOTE — Transfer of Care (Signed)
Immediate Anesthesia Transfer of Care Note  Patient: Kyle Berry  Procedure(s) Performed: LAPAROSCOPIC CHOLECYSTECTOMY WITH INTRAOPERATIVE CHOLANGIOGRAM (N/A Abdomen)  Patient Location: PACU  Anesthesia Type:General  Level of Consciousness: awake, alert  and oriented  Airway & Oxygen Therapy: Patient Spontanous Breathing and Patient connected to face mask oxygen  Post-op Assessment: Report given to RN and Post -op Vital signs reviewed and stable  Post vital signs: Reviewed and stable  Last Vitals:  Vitals Value Taken Time  BP 150/96 10/23/2017  1:56 PM  Temp    Pulse 80 10/23/2017  1:57 PM  Resp 16 10/23/2017  1:57 PM  SpO2 100 % 10/23/2017  1:57 PM  Vitals shown include unvalidated device data.  Last Pain:  Vitals:   10/23/17 1114  TempSrc:   PainSc: 4       Patients Stated Pain Goal: 2 (98/24/29 9806)  Complications: No apparent anesthesia complications

## 2017-10-23 NOTE — Progress Notes (Addendum)
PROGRESS NOTE    Kyle Berry  SJG:[id number] DOB: 1970-07-11 DOA: 10/18/2017 PCP: Kerin Perna, NP  Brief Narrative: 47 year old male admitted with a recurrent uncomplicated acute pancreatitis, history of pancreas divisum, prior EtOH use, last 2years ago, has been abstinent, GI consulted-etiology felt to be unclear, IgG4 normal, felt to be due to microlithiasis versus PD, GI recommended empiric cholecystectomy, general surgery consulted, transfer to [facility] for OR this afternoon by Dr.Martin   Assessment & Plan:     Acute on chronic pancreatitis ([place]) -Recurrent episodes -Felt to be related to pancreas divisum versus microlithiasis -Patient reports abstinence from EtOH for 2 years -IgG4 normal, ruling out Autoimmune pancreatitis -clinically improving, GI and general surgery following, plan for empiric cholecystectomy this afternoon, due to scheduling/OR time he will be transferred to [facility] long hospital     Hematemesis - transient, resolved suspected to be likely related to Mallory-Weiss tear early this admission -GI following -Hemoglobin stable, continue H2 blocker, allergic to PPI    Malnutrition of moderate degree -due to above, supplement as tolerated postop    Essential hypertension -continue diltiazem, was on this PTA  DVT prophylaxis: held for OR Code Status: Full Code Family Communication: No family at bedside Disposition Plan: Tx to [facility] for OR today  Consultants:   [facility]  [facility]   Procedures:   Antimicrobials:    Subjective: -feels ok, some nausea  Objective: Vitals:   10/22/17 2141 10/23/17 0518 10/23/17 1026 10/23/17 1103  BP: 131/79 134/88 (!) 147/99 (!) 160/98  Pulse: 64 (!) 59 67 61  Resp: 16 16 17 16   Temp: 98.4 F (36.9 C) 97.9 F (36.6 C) 98.4 F (36.9 C) 98.6 F (37 C)  TempSrc: Oral Oral Oral Oral  SpO2: 98% 100% 100% 99%  Weight:      Height:        Intake/Output Summary (Last 24 hours) at 10/23/2017 1343 Last  data filed at 10/23/2017 0532 Gross per 24 hour  Intake 480 ml  Output -  Net 480 ml   Filed Weights   10/18/17 1552 10/18/17 1559 10/18/17 2316  Weight: 80.7 kg (178 lb) 80.7 kg (178 lb) 77 kg (169 lb 12.1 oz)    Examination:  General exam: Appears calm and comfortable, well built male, no distress Respiratory system: Clear to auscultation Cardiovascular system: S1 & S2 heard, RRR.  Gastrointestinal system: Abdomen is nondistended, soft and nontender.Normal bowel sounds heard. Central nervous system: Alert and oriented. No focal neurological deficits. Extremities: Symmetric 5 x 5 power. Skin: No rashes, lesions or ulcers Psychiatry: Judgement and insight appear normal. Mood & affect appropriate.     Data Reviewed:   CBC: Recent Labs  Lab 10/19/17 0628 10/19/17 1318 10/19/17 2132 10/20/17 0545 10/21/17 0603  WBC 6.3 5.3 5.5 5.0 5.0  NEUTROABS  --   --   --   --  1.8  HGB 11.6* 12.1* 11.6* 11.9* 11.8*  HCT 36.3* 37.4* 35.6* 37.2* 37.2*  MCV 102.3* 100.5* 100.6* 101.4* 102.5*  PLT 233 243 238 260 654   Basic Metabolic Panel: Recent Labs  Lab 10/18/17 1633 10/19/17 0628  NA 138 139  K 4.6 3.5  CL 103 104  CO2 26 27  GLUCOSE 95 90  BUN 12 8  CREATININE 1.22 1.17  CALCIUM 9.1 8.8*  MG  --  1.9  PHOS  --  3.3   GFR: Estimated Creatinine Clearance: 84.1 mL/min (by C-G formula based on SCr of 1.17 mg/dL). Liver Function Tests:  Recent Labs  Lab 10/18/17 1633 10/19/17 0628  AST 64* 33  ALT 62* 47*  ALKPHOS 53 45  BILITOT 1.3* 0.9  PROT 7.0 6.3*  ALBUMIN 3.7 3.1*   Recent Labs  Lab 10/18/17 1633 10/20/17 0545 10/22/17 1117  LIPASE 76* 70* 49   No results for input(s): AMMONIA in the last 168 hours. Coagulation Profile: Recent Labs  Lab 10/19/17 0235  INR 1.11   Cardiac Enzymes: No results for input(s): CKTOTAL, CKMB, CKMBINDEX, TROPONINI in the last 168 hours. BNP (last 3 results) No results for input(s): PROBNP in the last 8760  hours. HbA1C: No results for input(s): HGBA1C in the last 72 hours. CBG: No results for input(s): GLUCAP in the last 168 hours. Lipid Profile: No results for input(s): CHOL, HDL, LDLCALC, TRIG, CHOLHDL, LDLDIRECT in the last 72 hours. Thyroid Function Tests: No results for input(s): TSH, T4TOTAL, FREET4, T3FREE, THYROIDAB in the last 72 hours. Anemia Panel: No results for input(s): VITAMINB12, FOLATE, FERRITIN, TIBC, IRON, RETICCTPCT in the last 72 hours. Urine analysis:    Component Value Date/Time   COLORURINE YELLOW 10/18/2017 2020   APPEARANCEUR CLEAR 10/18/2017 2020   LABSPEC 1.010 10/18/2017 2020   PHURINE 6.0 10/18/2017 2020   GLUCOSEU NEGATIVE 10/18/2017 2020   HGBUR NEGATIVE 10/18/2017 2020   BILIRUBINUR NEGATIVE 10/18/2017 2020   KETONESUR NEGATIVE 10/18/2017 2020   PROTEINUR NEGATIVE 10/18/2017 2020   UROBILINOGEN 0.2 01/20/2015 1643   NITRITE NEGATIVE 10/18/2017 2020   LEUKOCYTESUR NEGATIVE 10/18/2017 2020   Sepsis Labs: @LABRCNTIP (procalcitonin:4,lacticidven:4)  ) Recent Results (from the past 240 hour(s))  Surgical PCR screen     Status: Abnormal   Collection Time: 10/23/17  5:55 AM  Result Value Ref Range Status   MRSA, PCR NEGATIVE NEGATIVE Final   Staphylococcus aureus POSITIVE (A) NEGATIVE Final    Comment: (NOTE) The Xpert SA Assay (FDA approved for NASAL specimens in patients 32 years of age and older), is one component of a comprehensive surveillance program. It is not intended to diagnose infection nor to guide or monitor treatment. Performed at [facility], [place] [address], [place], [place] [address]          Radiology Studies: No results found.      Scheduled Meds: . [MAR Hold] Chlorhexidine Gluconate Cloth  6 each Topical Daily  . [MAR Hold] diltiazem  120 mg Oral Daily  . [MAR Hold] famotidine  20 mg Oral BID  . [MAR Hold] feeding supplement  1 Container Oral TID BM  . [MAR Hold] lipase/protease/amylase  36,000 Units Oral  TID WC  . [MAR Hold] mupirocin ointment  1 application Nasal BID   Continuous Infusions: . lactated ringers 10 mL/hr at 10/23/17 1105     LOS: 5 days    Time spent: 62min    Domenic Polite, MD [facility] Page via [email or web address], password [email or web address] After 7PM please contact night-coverage  10/23/2017, 1:43 PM

## 2017-10-23 NOTE — Op Note (Signed)
Kyle Berry  Primary Care Physician:  Kerin Perna, NP    10/23/2017  2:13 PM  Procedure: Laparoscopic Cholecystectomy with intraoperative cholangiogram  Surgeon: Catalina Antigua B. Hassell Done, MD, FACS Asst:  none  Anes:  General  Drains:  None  Findings: Chronic inflammatory adhesions to most of the gallbladder;  No pancreatic duct seen on the cholangiogram;  No CBD stones  Description of Procedure: The patient was taken to OR 4 and given general anesthesia.  The patient was prepped with Technicare and draped sterilely. A time out was performed.  Access to the abdomen was achieved with a 5 mm Optiview through the right upper quadrant.  Port placement included three 5 mm trocars and one 11 mm trocar in the upper midline.    The gallbladder was visualized and the fundus was grasped and the gallbladder was elevated. Traction on the infundibulum allowed for successful demonstration of the critical view. Inflammatory changes were chronic and extensive and these were lysed sharply.  The cystic duct was identified and clipped up on the gallbladder and an incision was made in the cystic duct and the Reddick catheter was inserted after milking the cystic duct of any debris. A dynamic cholangiogram was performed which demonstrated filling of the CBD and free flow into the duodenum; no pancreatic duct and no common bile duct stones.    The cystic duct was then triple clipped and divided, the cystic artery was double clipped and divided and then the gallbladder was removed from the gallbladder bed. Removal of the gallbladder from the gallbladder bed was uneventful and no spillage.  The gallbladder was then placed in a bag and brought out through one of the trocar sites. The gallbladder bed was inspected and no bleeding or bile leaks were seen.   Laparoscopic visualization was used when closing fascial defects for trocar sites with the PMI closer.   Incisions were injected with Exparel and closed with 4-0  Monocryl and Dermabond on the skin.  Sponge and needle count were correct.    The patient was taken to the recovery room in satisfactory condition.

## 2017-10-23 NOTE — Discharge Instructions (Signed)
[facility] ______[facility] °LAPAROSCOPIC SURGERY: POST OP INSTRUCTIONS °Always review your discharge instruction sheet given to you by the facility where your surgery was performed. °IF YOU HAVE DISABILITY OR FAMILY LEAVE FORMS, YOU MUST BRING THEM TO THE OFFICE FOR PROCESSING.   °DO NOT GIVE THEM TO YOUR DOCTOR. ° °1. A prescription for pain medication may be given to you upon discharge.  Take your pain medication as prescribed, if needed.  If narcotic pain medicine is not needed, then you may take acetaminophen (Tylenol) or ibuprofen (Advil) as needed. °2. Take your usually prescribed medications unless otherwise directed. °3. If you need a refill on your pain medication, please contact your pharmacy.  They will contact our office to request authorization. Prescriptions will not be filled after 5pm or on week-ends. °4. You should follow a light diet the first few days after arrival home, such as soup and crackers, etc.  Be sure to include lots of fluids daily. °5. Most patients will experience some swelling and bruising in the area of the incisions.  Ice packs will help.  Swelling and bruising can take several days to resolve.  °6. It is common to experience some constipation if taking pain medication after surgery.  Increasing fluid intake and taking a stool softener (such as Colace) will usually help or prevent this problem from occurring.  A mild laxative (Milk of Magnesia or Miralax) should be taken according to package instructions if there are no bowel movements after 48 hours. °7. Unless discharge instructions indicate otherwise, you may remove your bandages 24-48 hours after surgery, and you may shower at that time.  You may have steri-strips (small skin tapes) in place directly over the incision.  These strips should be left on the skin for 7-10 days.  If your surgeon used skin glue on the incision, you may shower in 24 hours.  The glue will flake off over the next 2-3 weeks.  Any sutures or  staples will be removed at the office during your follow-up visit. °8. ACTIVITIES:  You may resume regular (light) daily activities beginning the next day--such as daily self-care, walking, climbing stairs--gradually increasing activities as tolerated.  You may have sexual intercourse when it is comfortable.  Refrain from any heavy lifting or straining until approved by your doctor. °a. You may drive when you are no longer taking prescription pain medication, you can comfortably wear a seatbelt, and you can safely maneuver your car and apply brakes. °b. RETURN TO WORK:  __________________________________________________________ °9. You should see your doctor in the office for a follow-up appointment approximately 2-3 weeks after your surgery.  Make sure that you call for this appointment within a day or two after you arrive home to insure a convenient appointment time. °10. OTHER INSTRUCTIONS: __________________________________________________________________________________________________________________________ __________________________________________________________________________________________________________________________ °WHEN TO CALL YOUR DOCTOR: °1. Fever over 101.0 °2. Inability to urinate °3. Continued bleeding from incision. °4. Increased pain, redness, or drainage from the incision. °5. Increasing abdominal pain ° °The clinic staff is available to answer your questions during regular business hours.  Please don’t hesitate to call and ask to speak to one of the nurses for clinical concerns.  If you have a medical emergency, go to the nearest emergency room or call 911.  A surgeon from [facility] is always on call at the hospital. °[address], [address], [place], [place]  [address] ? [address] [address], [place], [place]   [address] °[phone] ? [phone] ? FAX [phone] °Web site:   [email or web address] °

## 2017-10-23 NOTE — Anesthesia Postprocedure Evaluation (Signed)
Anesthesia Post Note  Patient: Kyle Berry  Procedure(s) Performed: LAPAROSCOPIC CHOLECYSTECTOMY WITH INTRAOPERATIVE CHOLANGIOGRAM (N/A Abdomen)     Patient location during evaluation: PACU Anesthesia Type: General Level of consciousness: awake and alert Pain management: pain level controlled Vital Signs Assessment: post-procedure vital signs reviewed and stable Respiratory status: spontaneous breathing, nonlabored ventilation, respiratory function stable and patient connected to nasal cannula oxygen Cardiovascular status: blood pressure returned to baseline and stable Postop Assessment: no apparent nausea or vomiting Anesthetic complications: no    Last Vitals:  Vitals:   10/23/17 1356 10/23/17 1400  BP: (!) 150/96 (!) 145/97  Pulse: 80 75  Resp: 14 17  Temp: 36.5 C   SpO2: 100% 100%    Last Pain:  Vitals:   10/23/17 1356  TempSrc:   PainSc: 0-No pain                 Carlas Vandyne

## 2017-10-23 NOTE — Interval H&P Note (Signed)
History and Physical Interval Note:  10/23/2017 12:12 PM  Kyle Berry  has presented today for surgery, with the diagnosis of recurrent pancreatitis  The various methods of treatment have been discussed with the patient and family. After consideration of risks, benefits and other options for treatment, the patient has consented to  Procedure(s): LAPAROSCOPIC CHOLECYSTECTOMY WITH INTRAOPERATIVE CHOLANGIOGRAM (N/A) as a surgical intervention .  The patient's history has been reviewed, patient examined, no change in status, stable for surgery.  I have reviewed the patient's chart and labs.  Questions were answered to the patient's satisfaction.     Pedro Earls

## 2017-10-23 NOTE — Progress Notes (Signed)
Patient requesting phenergan for "nausea". None ordered. Dr Marcello Moores paged to make her aware of his request. Donne Hazel, RN

## 2017-10-24 ENCOUNTER — Encounter (HOSPITAL_COMMUNITY): Payer: Self-pay | Admitting: Surgery

## 2017-10-24 DIAGNOSIS — I10 Essential (primary) hypertension: Secondary | ICD-10-CM

## 2017-10-24 DIAGNOSIS — E440 Moderate protein-calorie malnutrition: Secondary | ICD-10-CM

## 2017-10-24 LAB — COMPREHENSIVE METABOLIC PANEL
ALT: 35 U/L (ref 0–44)
ANION GAP: 10 (ref 5–15)
AST: 32 U/L (ref 15–41)
Albumin: 3.4 g/dL — ABNORMAL LOW (ref 3.5–5.0)
Alkaline Phosphatase: 40 U/L (ref 38–126)
BUN: 7 mg/dL (ref 6–20)
CHLORIDE: 106 mmol/L (ref 98–111)
CO2: 24 mmol/L (ref 22–32)
Calcium: 9.1 mg/dL (ref 8.9–10.3)
Creatinine, Ser: 1 mg/dL (ref 0.61–1.24)
GFR calc non Af Amer: >60 mL/min (ref >60)
Glucose, Bld: 132 mg/dL — ABNORMAL HIGH (ref 70–99)
POTASSIUM: 4 mmol/L (ref 3.5–5.1)
SODIUM: 140 mmol/L (ref 135–145)
Total Bilirubin: 0.4 mg/dL (ref 0.3–1.2)
Total Protein: 6.7 g/dL (ref 6.5–8.1)

## 2017-10-24 LAB — CBC
HEMATOCRIT: 35.6 % — AB (ref 39.0–52.0)
HEMOGLOBIN: 11.8 g/dL — AB (ref 13.0–17.0)
MCH: 32.8 pg (ref 26.0–34.0)
MCHC: 33.1 g/dL (ref 30.0–36.0)
MCV: 98.9 fL (ref 78.0–100.0)
Platelets: 349 10*3/uL (ref 150–400)
RBC: 3.6 MIL/uL — AB (ref 4.22–5.81)
RDW: 13.5 % (ref 11.5–15.5)
WBC: 7.3 10*3/uL (ref 4.0–10.5)

## 2017-10-24 MED ORDER — PANCRELIPASE (LIP-PROT-AMYL) 12000-38000 UNITS PO CPEP
36000.0000 [IU] | ORAL_CAPSULE | Freq: Three times a day (TID) | ORAL | Status: DC
  Administered 2017-10-24: 36000 [IU] via ORAL
  Filled 2017-10-24: qty 3

## 2017-10-24 MED ORDER — TRAMADOL HCL 50 MG PO TABS: 50.0000 mg | ORAL_TABLET | Freq: Three times a day (TID) | ORAL | 0 refills | Status: AC | PRN

## 2017-10-24 MED ORDER — FAMOTIDINE 20 MG PO TABS
20.0000 mg | ORAL_TABLET | Freq: Two times a day (BID) | ORAL | Status: DC
  Administered 2017-10-24: 20 mg via ORAL
  Filled 2017-10-24: qty 1

## 2017-10-24 MED ORDER — ACETAMINOPHEN 325 MG PO TABS: 650.0000 mg | ORAL_TABLET | Freq: Four times a day (QID) | ORAL | Status: DC | PRN

## 2017-10-24 MED ORDER — DILTIAZEM HCL 60 MG PO TABS: 120.0000 mg | ORAL_TABLET | Freq: Every day | ORAL | Status: DC

## 2017-10-24 MED ORDER — DILTIAZEM HCL ER COATED BEADS 120 MG PO CP24
120.0000 mg | ORAL_CAPSULE | Freq: Every day | ORAL | Status: DC
  Administered 2017-10-24: 120 mg via ORAL
  Filled 2017-10-24: qty 1

## 2017-10-24 NOTE — Progress Notes (Signed)
Discharge instructions discussed with patient, verbalized agreement and understanding, 

## 2017-10-24 NOTE — Progress Notes (Signed)
1 Day Post-Op    CC: recurrent pancreatitis  Subjective: He looks good this AM and is hungry.  Sites look great and he does not have any pain, aside from normal post op pain.    Objective: Vital signs in last 24 hours: Temp:  [97.6 F (36.4 C)-98.8 F (37.1 C)] 97.9 F (36.6 C) (07/11 0455) Pulse Rate:  [55-83] 55 (07/11 0455) Resp:  [14-18] 18 (07/11 0455) BP: (124-166)/(79-100) 135/88 (07/11 0455) SpO2:  [95 %-100 %] 100 % (07/11 0455) Last BM Date: 10/23/17 Nothing PO recorded 2520 IV recorded Urine 2475 Afebrile, VSS Labs OK IOC:  Intraoperative cholangiogram demonstrates extrahepatic biliary ducts of unremarkable caliber, with no definite filling defects identified. Free flow of contrast across the ampulla.    Intake/Output from previous day: 07/10 0701 - 07/11 0700 In: 2520 [I.V.:2270; IV Piggyback:250] Out: 2485 [Urine:2475; Blood:10] Intake/Output this shift: No intake/output data recorded.  General appearance: alert, cooperative and no distress Resp: clear to auscultation bilaterally GI: soft, non-tender; bowel sounds normal; no masses,  no organomegaly  Lab Results:  Recent Labs    10/23/17 1443 10/24/17 0444  WBC 13.1* 7.3  HGB 12.7* 11.8*  HCT 39.1 35.6*  PLT 350 349    BMET Recent Labs    10/23/17 1443 10/24/17 0444  NA  --  140  K  --  4.0  CL  --  106  CO2  --  24  GLUCOSE  --  132*  BUN  --  7  CREATININE 1.17 1.00  CALCIUM  --  9.1   PT/INR No results for input(s): LABPROT, INR in the last 72 hours.  Recent Labs  Lab 10/18/17 1633 10/19/17 0628 10/24/17 0444  AST 64* 33 32  ALT 62* 47* 35  ALKPHOS 53 45 40  BILITOT 1.3* 0.9 0.4  PROT 7.0 6.3* 6.7  ALBUMIN 3.7 3.1* 3.4*     Lipase     Component Value Date/Time   LIPASE 38 10/23/2017 1443     Medications: . heparin injection (subcutaneous)  5,000 Units Subcutaneous Q8H   . cefTRIAXone (ROCEPHIN)  IV Stopped (10/23/17 2233)  . dextrose 5 % and 0.45 % NaCl with  KCl 20 mEq/L 100 mL/hr at 10/24/17 0608  . 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 Johnathan Hausen  ID -Rocephin pre op VTE -SCDs FEN -IVF, Clears Foley -none   Plan:  Resume diet, Tylenol/Vicodin for pain.He has an allergy to NSAID. I will resume his cardizem and Creon, Pepcid PO. If he does well he can go home later this AM if OK with Medicine.  He follows with [facility]. And can continue to follow up with them,  He can go when he can eat, drink, tolerates pain with PO medicines, and voids.        LOS: 6 days    Sherlyn Ebbert 10/24/2017 [phone]

## 2017-10-24 NOTE — Discharge Summary (Signed)
Physician Discharge Summary  Kyle Berry  TWS:[id number]  DOB: 02/14/1971  DOA: 10/18/2017 PCP: Kerin Perna, NP  Admit date: 10/18/2017 Discharge date: 10/24/2017  Admitted From: Home  Disposition: Home   Recommendations for Outpatient Follow-up:  1. Follow up with PCP in 1-2 weeks 2. Follow up with GI in 2 weeks  3. Follow up with Dr. Hassell Done on 7/25  4. Please obtain BMP/CBC in one week to monitor electrolytes and Hgb   Discharge Condition: Stable  CODE STATUS: Full Code  Diet recommendation: Heart Healthy   Brief/Interim Summary: For full details see H&P/Progress note, but in brief, Kyle Berry is a 47 year old male with history of chronic pancreatitis presented to the emergency department with hematemesis 2x, felt to be Mallory-Weiss tear. Also found to have a recurrent uncomplicated acute pancreatitis.  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  GI was consulted with urology felt to be unclear.  IgG4 was negative.  Subsequently felt to be microlithiasis versus PD, GI recommended empiric cholecystectomy.  General surgery evaluated patient and patient was transfer to [facility] for lap chole.  Patient underwent laparoscopic cholecystectomy with no complications.  Patient diet was advanced tolerated well, clinically improved and patient was clear by surgical service for discharge.  Subjective: Patient seen and examined, has no complaints this morning other than soreness from surgical procedure.  Patient tolerating diet well.  Ambulated and voiding with no issues.  Patient afebrile and blood pressure well controlled.  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  Patient underwent laparoscopic cholecystectomy 7/10. IgG4 normal ruling out autoimmune pancreatitis.  Patient has clinically improved and  tolerating diet well.  Continue Creon. Follow up with GI in 2 weeks .  S/p Lap Chole  Tolerated well procedure, no complication. Was given rocephin pre op. Gen surgery arranged follow up. Will discharge on tramadol and tylenol for pain control. Patient allergic to NSAID's    Hematemesis Transient, resolved felt to be related to Mallory-Weiss tear.  Hemoglobin stable.  Continue H2 blocker as patient is allergic to PPI  Hypertension BP stable during hospital stay, continue diltiazem with no changes  Malnutrition of moderate degrees Due to chronic pancreatitis, resume regular diet  On the day of the discharge the patient's vitals were stable, and no other acute medical condition were reported by patient. the patient was felt safe to be discharge to home.   Discharge Instructions  You were cared for by a hospitalist during your hospital stay. If you have any questions about your discharge medications or the care you received while you were in the hospital after you are discharged, you can call the unit and asked to speak with the hospitalist on call if the hospitalist that took care of you is not available. Once you are discharged, your primary care physician will handle any further medical issues. Please note that NO REFILLS for any discharge medications will be authorized once you are discharged, as it is imperative that you return to your primary care physician (or establish a relationship with a primary care physician if you do not have one) for your aftercare needs so that they can reassess your need for medications and monitor your lab values.  Discharge Instructions    Call MD for:  difficulty breathing, headache or visual disturbances   Complete by:  As directed    Call MD for:  extreme fatigue   Complete by:  As directed    Call MD for:  hives   Complete by:  As directed    Call MD for:  persistant dizziness or light-headedness   Complete by:  As directed    Call MD for:  persistant  nausea and vomiting   Complete by:  As directed    Call MD for:  redness, tenderness, or signs of infection (pain, swelling, redness, odor or green/yellow discharge around incision site)   Complete by:  As directed    Call MD for:  severe uncontrolled pain   Complete by:  As directed    Call MD for:  temperature >100.4   Complete by:  As directed    Diet - low sodium heart healthy   Complete by:  As directed    Increase activity slowly   Complete by:  As directed      Allergies as of 10/24/2017      Reactions   Diclofenac Hives   Norvasc [amlodipine Besylate] Other (See Comments)   Fluid buildup in chest   Omeprazole Other (See Comments)   MD stopped due to pancreatitis   Prednisone Other (See Comments)   Mood swings      Medication List    TAKE these medications   albuterol 108 (90 Base) MCG/ACT inhaler Commonly known as:  PROVENTIL HFA;VENTOLIN HFA Inhale 1-2 puffs into the lungs every 4 (four) hours as needed for wheezing or shortness of breath.   diltiazem 120 MG 24 hr capsule Commonly known as:  DILACOR XR Take 1 capsule (120 mg total) by mouth daily.   EYE DROPS OP Place 2 drops into both eyes daily.   famotidine 20 MG tablet Commonly known as:  PEPCID Take 1 tablet (20 mg total) by mouth 2 (two) times daily. What changed:    when to take this  reasons to take this   feeding supplement Liqd Take 1 Container by mouth 3 (three) times daily between meals.   lipase/protease/amylase 36000 UNITS Cpep capsule Commonly known as:  CREON Take 1 capsule (36,000 Units total) by mouth See admin instructions. Take one capsules (36,000 units) by mouth with meals (2 meals daily) and one capsule (36000 units) with snacks daily   promethazine 25 MG tablet Commonly known as:  PHENERGAN Take 1 tablet (25 mg total) by mouth every 6 (six) hours as needed for nausea or vomiting.   traMADol 50 MG tablet Commonly known as:  ULTRAM Take 1 tablet (50 mg total) by mouth every 8  (eight) hours as needed for up to 5 days for moderate pain.      Follow-up Information    [facility] [place] Follow up on 11/07/2017.   Specialty:  General Surgery Why:  your appointment is at 2:00 PM.  Be at the office 30 minutes early for check in.  Bring photo ID and insurance information with you. Contact information: [address]  [place] [address] [phone]        Kerin Perna, NP. Schedule an appointment as soon as possible for a visit in 1 week(s).   Specialty:  Internal Medicine Why:  Hospital follow up  Contact information: [place] [address] [phone]        Willia Craze, NP. Schedule an appointment as soon as possible for a visit in 2 week(s).   Specialty:  Gastroenterology Why:  Hospital follow up  Contact information: [place]  [place] [address] [phone]          Allergies  Allergen Reactions  . Diclofenac Hives  . Norvasc [Amlodipine Besylate] Other (See Comments)    Fluid buildup in chest  . Omeprazole Other (See Comments)    MD stopped due to pancreatitis  . Prednisone Other (See Comments)    Mood swings    Consultations:  General Surgery   GI   Procedures/Studies: Dg Chest 2 View  Result Date: 10/18/2017 CLINICAL DATA:  Hematemesis since yesterday. EXAM: CHEST - 2 VIEW COMPARISON:  PA and lateral chest 04/08/2017. Single-view of the chest 10/12/2017. FINDINGS: The lungs are clear. Heart size is normal. No pneumothorax or pleural effusion. No focal bony abnormality IMPRESSION: Negative chest. Electronically Signed   By: Inge Rise M.D.   On: 10/18/2017 18:33   Dg Cholangiogram Operative  Result Date: 10/23/2017 CLINICAL DATA:  47 year old male with a history of cholelithiasis EXAM: INTRAOPERATIVE CHOLANGIOGRAM TECHNIQUE: Cholangiographic images from the C-arm fluoroscopic device were submitted for interpretation post-operatively. Please see the procedural  report for the amount of contrast and the fluoroscopy time utilized. COMPARISON:  CT 10/19/2017, ultrasound 08/25/2017 FINDINGS: Surgical instruments project over the upper abdomen. There is cannulation of the cystic duct/gallbladder neck, with antegrade infusion of contrast. Caliber of the extrahepatic ductal system within normal limits. No definite filling defect within the extrahepatic ducts identified. Free flow of contrast across the ampulla. IMPRESSION: Intraoperative cholangiogram demonstrates extrahepatic biliary ducts of unremarkable caliber, with no definite filling defects identified. Free flow of contrast across the ampulla. Please refer to the dictated operative report for full details of intraoperative findings and procedure Electronically Signed   By: Corrie Mckusick D.O.   On: 10/23/2017 13:53   Ct Abdomen Pelvis W Contrast  Result Date: 10/19/2017 CLINICAL DATA:  Recent diagnosis of pancreatitis with persistent abdominal pain. EXAM: CT ABDOMEN AND PELVIS WITH CONTRAST TECHNIQUE: Multidetector CT imaging of the abdomen and pelvis was performed using the standard protocol following bolus administration of intravenous contrast. CONTRAST:  138mL OMNIPAQUE IOHEXOL 300 MG/ML  SOLN COMPARISON:  CT abdomen pelvis-10/11/2017; 01/17/2017; 06/24/2016 FINDINGS: Lower chest: Limited visualization the lower thorax demonstrates minimal dependent subpleural atelectasis within the imaged left lower lobe. No focal airspace opacities. No pleural effusion. Borderline cardiomegaly.  No pericardial effusion. Hepatobiliary: Normal hepatic contour. Punctate subcentimeter hypoattenuating lesions within the dome of the right lobe of the liver (images 11 and 13, series 3) are too small to adequately characterize though similar to the 06/2016 examination and favored to represent hepatic cysts. Normal appearance of the gallbladder given degree distention. No radiopaque gallstones. No intra or extrahepatic biliary ductal  dilatation. No ascites. Pancreas: Grossly unchanged minimal amount of peripancreatic stranding (axial image 28, series 3; coronal image 37, series 6). There is homogeneous enhancement of the pancreatic parenchyma without evidence of pancreatic necrosis. No definitive pancreatic mass or pancreatic ductal dilatation on this non pancreatic protocol CT. 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 There is homogeneous enhancement of the left kidney. Unchanged punctate adjacent nonobstructing stones within inferior pole of the left kidney with dominant stone measuring approximately 8 mm (coronal image 46, series 6). No urinary obstruction or perinephric stranding. Note is made of an approximately 1.3 cm hypoattenuating nonenhancing cyst arising from the posterosuperior aspect the right kidney. No discrete left-sided renal lesions. Normal appearance of the bilateral adrenal glands. Normal appearance of the urinary bladder given degree distention. Stomach/Bowel: Ingested enteric contrast extends to the  level the rectum. No evidence of enteric obstruction normal appearance of the terminal ileum and retrocecal appendix. No pneumoperitoneum, pneumatosis or portal venous gas. Vascular/Lymphatic: Normal caliber the abdominal aorta. The major branch vessels of the abdominal aorta appear widely patent. The splenic artery and SMA the both appear widely patent without evidence of vessel irregularity. No bulky retroperitoneal, mesenteric, pelvic or inguinal lymphadenopathy. Reproductive: Normal appearance of the pelvic organs. No free fluid in the pelvic cul-de-sac. Other: There is a minimal amount of subcutaneous edema about the midline of the low back. Musculoskeletal: No acute or aggressive osseous abnormalities. Moderate DDD of L5-S1 with disc space height loss, endplate irregularity and sclerosis. IMPRESSION: 1. Grossly unchanged findings compatible with  acute uncomplicated pancreatitis. Specifically, no evidence pancreatic necrosis or definable/drainable pancreatic pseudocyst. 2. Nonobstructing left-sided nephrolithiasis. 3. 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 Electronically Signed   By: Sandi Mariscal M.D.   On: 10/19/2017 13:54   Dg Chest Port 1 View  Result Date: 10/12/2017 CLINICAL DATA:  Chest pain. EXAM: PORTABLE CHEST 1 VIEW COMPARISON:  04/08/2017. FINDINGS: Normal sized heart with normal vascularity. Mild left basilar atelectasis and minimal right basilar atelectasis. Otherwise, clear lungs. Small left pleural effusion mild scoliosis. IMPRESSION: 1. Mild left basilar atelectasis and minimal right basilar atelectasis. 2. Small left pleural effusion. Electronically Signed   By: Claudie Revering M.D.   On: 10/12/2017 08:10   Ct Renal Stone Study  Result Date: 10/11/2017 CLINICAL DATA:  47 year old male with left-sided flank pain. EXAM: CT ABDOMEN AND PELVIS WITHOUT CONTRAST TECHNIQUE: Multidetector CT imaging of the abdomen and pelvis was performed following the standard protocol without IV contrast. COMPARISON:  Abdominal ultrasound dated 08/25/2017 and CT of abdomen pelvis dated 01/17/2017 FINDINGS: Evaluation of this exam is limited in the absence of intravenous contrast. Lower chest: The visualized lung bases are clear. No intra-abdominal free air or free fluid. Hepatobiliary: There is mild fatty infiltration of the liver. No intrahepatic biliary ductal dilatation. The gallbladder is unremarkable. Pancreas: There is inflammatory changes of the pancreas with peripancreatic stranding most consistent with acute pancreatitis. Correlation with pancreatic enzymes recommended. No fluid collection, abscess, or pseudocyst. Spleen: Normal in size without focal abnormality. Adrenals/Urinary Tract: The adrenal glands are unremarkable. The right kidney is atrophic. A 12 mm hypodense lesion arising from the right kidney is not characterized but appears  similar to the CT of 2018, likely a cyst. Nonobstructing left renal inferior pole calculi measure up to 6 mm. There is no hydronephrosis. The left ureter and urinary bladder appear unremarkable. Stomach/Bowel: Multiple small diverticula noted in the ascending colon. There is no bowel obstruction or active inflammation. The appendix is normal. Vascular/Lymphatic: The abdominal aorta and IVC appear unremarkable on this noncontrast CT. No portal venous gas. There is no adenopathy. Reproductive: The prostate and seminal vesicles are grossly unremarkable. No pelvic mass Other: None Musculoskeletal: Degenerative changes at L5-S1. No acute osseous pathology. IMPRESSION: 1. Acute pancreatitis.  No abscess. 2. Nonobstructing left renal inferior pole calculi. No hydronephrosis. Atrophic right kidney. 3. Fatty liver. 4. Small cecal and ascending colon diverticula without active inflammatory changes. No bowel obstruction. Normal appendix. Electronically Signed   By: Anner Crete M.D.   On: 10/11/2017 21:46    Discharge Exam: Vitals:   10/24/17 0213 10/24/17 0455  BP: 129/79 135/88  Pulse: 67 (!) 55  Resp: 18 18  Temp: 97.8 F (36.6 C) 97.9 F (36.6 C)  SpO2: 95% 100%   Vitals:   10/23/17 1808 10/23/17 2131  10/24/17 0213 10/24/17 0455  BP: (!) 146/93 124/87 129/79 135/88  Pulse: 83 83 67 (!) 55  Resp: 18 18 18 18   Temp: 97.6 F (36.4 C) 98.5 F (36.9 C) 97.8 F (36.6 C) 97.9 F (36.6 C)  TempSrc: Oral Oral Oral Oral  SpO2: 100% 100% 95% 100%  Weight:      Height:        General: Pt is alert, awake, not in acute distress Cardiovascular: RRR, S1/S2 +, no rubs, no gallops Respiratory: CTA bilaterally, no wheezing, no rhonchi Abdominal: Soft, NT, ND, bowel sounds + Extremities: no edema, no cyanosis   The results of significant diagnostics from this hospitalization (including imaging, microbiology, ancillary and laboratory) are listed below for reference.     Microbiology: Recent Results  (from the past 240 hour(s))  Surgical PCR screen     Status: Abnormal   Collection Time: 10/23/17  5:55 AM  Result Value Ref Range Status   MRSA, PCR NEGATIVE NEGATIVE Final   Staphylococcus aureus POSITIVE (A) NEGATIVE Final    Comment: (NOTE) The Xpert SA Assay (FDA approved for NASAL specimens in patients 64 years of age and older), is one component of a comprehensive surveillance program. It is not intended to diagnose infection nor to guide or monitor treatment. Performed at [facility], [place] [address], [place], [place] [address]      Labs: BNP (last 3 results) No results for input(s): BNP in the last 8760 hours. Basic Metabolic Panel: Recent Labs  Lab 10/18/17 1633 10/19/17 0628 10/23/17 1443 10/24/17 0444  NA 138 139  --  140  K 4.6 3.5  --  4.0  CL 103 104  --  106  CO2 26 27  --  24  GLUCOSE 95 90  --  132*  BUN 12 8  --  7  CREATININE 1.22 1.17 1.17 1.00  CALCIUM 9.1 8.8*  --  9.1  MG  --  1.9  --   --   PHOS  --  3.3  --   --    Liver Function Tests: Recent Labs  Lab 10/18/17 1633 10/19/17 0628 10/24/17 0444  AST 64* 33 32  ALT 62* 47* 35  ALKPHOS 53 45 40  BILITOT 1.3* 0.9 0.4  PROT 7.0 6.3* 6.7  ALBUMIN 3.7 3.1* 3.4*   Recent Labs  Lab 10/18/17 1633 10/20/17 0545 10/22/17 1117 10/23/17 1443  LIPASE 76* 70* 49 38   No results for input(s): AMMONIA in the last 168 hours. CBC: Recent Labs  Lab 10/19/17 2132 10/20/17 0545 10/21/17 0603 10/23/17 1443 10/24/17 0444  WBC 5.5 5.0 5.0 13.1* 7.3  NEUTROABS  --   --  1.8  --   --   HGB 11.6* 11.9* 11.8* 12.7* 11.8*  HCT 35.6* 37.2* 37.2* 39.1 35.6*  MCV 100.6* 101.4* 102.5* 100.0 98.9  PLT 238 260 271 350 349   Cardiac Enzymes: No results for input(s): CKTOTAL, CKMB, CKMBINDEX, TROPONINI in the last 168 hours. BNP: Invalid input(s): POCBNP CBG: No results for input(s): GLUCAP in the last 168 hours. D-Dimer No results for input(s): DDIMER in the last 72 hours. Hgb A1c No  results for input(s): HGBA1C in the last 72 hours. Lipid Profile No results for input(s): CHOL, HDL, LDLCALC, TRIG, CHOLHDL, LDLDIRECT in the last 72 hours. Thyroid function studies No results for input(s): TSH, T4TOTAL, T3FREE, THYROIDAB in the last 72 hours.  Invalid input(s): FREET3 Anemia work up No results for input(s): VITAMINB12, FOLATE, FERRITIN,  TIBC, IRON, RETICCTPCT in the last 72 hours. Urinalysis    Component Value Date/Time   COLORURINE YELLOW 10/18/2017 2020   APPEARANCEUR CLEAR 10/18/2017 2020   LABSPEC 1.010 10/18/2017 2020   PHURINE 6.0 10/18/2017 2020   GLUCOSEU NEGATIVE 10/18/2017 2020   HGBUR NEGATIVE 10/18/2017 2020   BILIRUBINUR NEGATIVE 10/18/2017 2020   KETONESUR NEGATIVE 10/18/2017 2020   PROTEINUR NEGATIVE 10/18/2017 2020   UROBILINOGEN 0.2 01/20/2015 1643   NITRITE NEGATIVE 10/18/2017 2020   LEUKOCYTESUR NEGATIVE 10/18/2017 2020   Sepsis Labs Invalid input(s): PROCALCITONIN,  WBC,  LACTICIDVEN Microbiology Recent Results (from the past 240 hour(s))  Surgical PCR screen     Status: Abnormal   Collection Time: 10/23/17  5:55 AM  Result Value Ref Range Status   MRSA, PCR NEGATIVE NEGATIVE Final   Staphylococcus aureus POSITIVE (A) NEGATIVE Final    Comment: (NOTE) The Xpert SA Assay (FDA approved for NASAL specimens in patients 68 years of age and older), is one component of a comprehensive surveillance program. It is not intended to diagnose infection nor to guide or monitor treatment. Performed at [facility], [place] [address], [place], [place] [address]     Time coordinating discharge: 32 minutes  SIGNED:  Chipper Oman, MD  [facility] 10/24/2017, 11:31 AM  Pager please text page via  [email or web address]  Note - This record has been created using [employer]. Chart creation errors have been sought, but may not always have been located. Such creation errors do not reflect on the standard of medical care.

## 2017-10-27 ENCOUNTER — Emergency Department (HOSPITAL_COMMUNITY)
Admission: EM | Admit: 2017-10-27 | Discharge: 2017-10-27 | Disposition: A | Discharge status: Home / Self Care | Payer: Managed Care, Other (non HMO) | Attending: Emergency Medicine | Admitting: Emergency Medicine

## 2017-10-27 ENCOUNTER — Encounter (HOSPITAL_COMMUNITY): Payer: Self-pay | Admitting: Emergency Medicine

## 2017-10-27 ENCOUNTER — Other Ambulatory Visit: Payer: Self-pay

## 2017-10-27 ENCOUNTER — Emergency Department (HOSPITAL_COMMUNITY): Payer: Managed Care, Other (non HMO)

## 2017-10-27 DIAGNOSIS — K861 Other chronic pancreatitis: Secondary | ICD-10-CM | POA: Insufficient documentation

## 2017-10-27 DIAGNOSIS — Z79899 Other long term (current) drug therapy: Secondary | ICD-10-CM | POA: Insufficient documentation

## 2017-10-27 DIAGNOSIS — R1013 Epigastric pain: Secondary | ICD-10-CM | POA: Diagnosis present

## 2017-10-27 DIAGNOSIS — I10 Essential (primary) hypertension: Secondary | ICD-10-CM | POA: Insufficient documentation

## 2017-10-27 LAB — URINALYSIS, ROUTINE W REFLEX MICROSCOPIC
BACTERIA UA: NONE SEEN
BILIRUBIN URINE: NEGATIVE
GLUCOSE, UA: NEGATIVE mg/dL
KETONES UR: NEGATIVE mg/dL
Leukocytes, UA: NEGATIVE
NITRITE: NEGATIVE
PH: 5 (ref 5.0–8.0)
Protein, ur: NEGATIVE mg/dL
RBC / HPF: >50 RBC/hpf — ABNORMAL HIGH (ref 0–5)
SPECIFIC GRAVITY, URINE: 1.017 (ref 1.005–1.030)

## 2017-10-27 LAB — CBC
HEMATOCRIT: 40.9 % (ref 39.0–52.0)
Hemoglobin: 13.3 g/dL (ref 13.0–17.0)
MCH: 32.3 pg (ref 26.0–34.0)
MCHC: 32.5 g/dL (ref 30.0–36.0)
MCV: 99.3 fL (ref 78.0–100.0)
PLATELETS: 346 10*3/uL (ref 150–400)
RBC: 4.12 MIL/uL — ABNORMAL LOW (ref 4.22–5.81)
RDW: 13.2 % (ref 11.5–15.5)
WBC: 9.3 10*3/uL (ref 4.0–10.5)

## 2017-10-27 LAB — COMPREHENSIVE METABOLIC PANEL
ALBUMIN: 3.7 g/dL (ref 3.5–5.0)
ALK PHOS: 57 U/L (ref 38–126)
ALT: 57 U/L — AB (ref 0–44)
AST: 47 U/L — AB (ref 15–41)
Anion gap: 10 (ref 5–15)
BILIRUBIN TOTAL: 0.4 mg/dL (ref 0.3–1.2)
BUN: 13 mg/dL (ref 6–20)
CALCIUM: 9.5 mg/dL (ref 8.9–10.3)
CO2: 25 mmol/L (ref 22–32)
CREATININE: 1.18 mg/dL (ref 0.61–1.24)
Chloride: 102 mmol/L (ref 98–111)
GFR calc Af Amer: >60 mL/min (ref >60)
Glucose, Bld: 99 mg/dL (ref 70–99)
POTASSIUM: 3.8 mmol/L (ref 3.5–5.1)
Sodium: 137 mmol/L (ref 135–145)
TOTAL PROTEIN: 6.9 g/dL (ref 6.5–8.1)

## 2017-10-27 LAB — LIPASE, BLOOD: Lipase: 56 U/L — ABNORMAL HIGH (ref 11–51)

## 2017-10-27 MED ORDER — IOHEXOL 300 MG/ML  SOLN
80.0000 mL | Freq: Once | INTRAMUSCULAR | Status: AC | PRN
  Administered 2017-10-27: 100 mL via INTRAVENOUS

## 2017-10-27 NOTE — ED Triage Notes (Addendum)
Had gall bladder out last wed and now he has abd pain and bloated and  Gassy, no vomting

## 2017-10-27 NOTE — ED Provider Notes (Signed)
[facility] Provider Note   CSN: [id number] Arrival date & time: 10/27/17  1125     History   Chief Complaint Chief Complaint  Patient presents with  . Abdominal Pain    HPI Kyle Berry is a 47 y.o. male with h/o chronic pancreatitis, alcohol abuse in remission, HTN, HLD, GERD, congenital single kidney here with epigastric abdominal pain onset this morning at 630 am. Hervey Ard, crampy, in mid epigastrium radiating to bilateral upper abdomen.  Now pain 5/10, constant worse with palpation.  No interventions PTA. Associated with gas and bloating.  No fevers, chills, nausea, vomiting, changes in BM, urinary symptoms, radiation into chest. Last ETOH intake several months ago. No heavy use of NSAIDs.   HPI  Past Medical History:  Diagnosis Date  . Acute pancreatitis 11/23/2015  . Chronic bronchitis ([place])   . Chronic lower back pain   . Congenital single kidney   . GERD (gastroesophageal reflux disease)   . Headache    "once/month" (05/13/2017)  . High cholesterol   . History of kidney stones   . Hypertension   . Migraine    "a couple/year" (05/13/2017)  . Pneumonia ~ 09/2015  . Recurrent acute pancreatitis   . Renal disorder     Patient Active Problem List   Diagnosis Date Noted  . Malnutrition of moderate degree 10/20/2017  . Hematemesis 10/18/2017  . Pancreatitis 10/12/2017  . Generalized abdominal pain   . Chest pain 10/11/2017  . Pancreatitis, recurrent 08/25/2017  . Recurrent pancreatitis 05/13/2017  . GERD (gastroesophageal reflux disease) 05/13/2017  . Congenital single kidney 05/13/2017  . High cholesterol 05/13/2017  . Abnormal urinalysis 05/13/2017  . Nephrolithiasis 05/13/2017  . Acute on chronic pancreatitis ([place]) 01/17/2017  . Acute recurrent pancreatitis   . History of alcohol abuse   . Hypokalemia   . Non-intractable vomiting with nausea   . Essential hypertension   . Esophageal reflux   . Chronic  pancreatitis ([place]) 11/23/2015    Past Surgical History:  Procedure Laterality Date  . CHOLECYSTECTOMY N/A 10/23/2017   Procedure: LAPAROSCOPIC CHOLECYSTECTOMY WITH INTRAOPERATIVE CHOLANGIOGRAM;  Surgeon: Johnathan Hausen, MD;  Location: [facility] ORS;  Service: General;  Laterality: N/A;  . NO PAST SURGERIES          Home Medications    Prior to Admission medications   Medication Sig Start Date End Date Taking? Authorizing Provider  albuterol (PROVENTIL HFA;VENTOLIN HFA) 108 (90 Base) MCG/ACT inhaler Inhale 1-2 puffs into the lungs every 4 (four) hours as needed for wheezing or shortness of breath.   Yes [provider]  Carboxymethylcellulose Sodium (EYE DROPS OP) Place 2 drops into both eyes daily.    Yes [provider]  CARTIA XT 120 MG 24 hr capsule Take 120 mg by mouth daily. 10/24/17  Yes [provider]  famotidine (PEPCID) 20 MG tablet Take 1 tablet (20 mg total) by mouth 2 (two) times daily. Patient taking differently: Take 20 mg by mouth 2 (two) times daily as needed for heartburn or indigestion.  10/14/17  Yes Emokpae, [name], MD  feeding supplement (BOOST / RESOURCE BREEZE) LIQD Take 1 Container by mouth 3 (three) times daily between meals. 01/20/17  Yes Mikhail, Velta Addison, DO  lipase/protease/amylase (CREON) 36000 UNITS CPEP capsule Take 1 capsule (36,000 Units total) by mouth See admin instructions. Take one capsules (36,000 units) by mouth with meals (2 meals daily) and one capsule (36000 units) with snacks daily 10/14/17  Yes Roxan Hockey, MD  promethazine (PHENERGAN) 25 MG tablet Take 1 tablet (25 mg total) by mouth every 6 (six) hours as needed for nausea or vomiting. 10/14/17  Yes Emokpae, [name], MD  traMADol (ULTRAM) 50 MG tablet Take 1 tablet (50 mg total) by mouth every 8 (eight) hours as needed for up to 5 days for moderate pain. 10/24/17 10/29/17 Yes Doreatha Lew, MD  diltiazem (DILACOR XR) 120 MG 24 hr capsule Take 1 capsule (120 mg total) by  mouth daily. Patient not taking: Reported on 10/27/2017 10/14/17   Roxan Hockey, MD    Family History Family History  Problem Relation Age of Onset  . Hypertension Other   . Hypertension Mother   . Hypertension Father   . Kidney disease Father   . Hypertension Sister   . Diabetes Sister   . Hypertension Brother     Social History Social History   Tobacco Use  . Smoking status: Never Smoker  . Smokeless tobacco: Never Used  Substance Use Topics  . Alcohol use: Not Currently    Comment: 05/13/2017 "might have beer a few times/year"  . Drug use: No     Allergies   Diclofenac; Norvasc [amlodipine besylate]; Omeprazole; and Prednisone   Review of Systems Review of Systems  Gastrointestinal: Positive for abdominal pain.  All other systems reviewed and are negative.    Physical Exam Updated Vital Signs BP 128/85 (BP Location: Right Arm)   Pulse 83   Temp 98.8 F (37.1 C) (Oral)   Resp 16   Ht 6\' 1"  (1.854 m)   Wt 76.7 kg (169 lb)   SpO2 100%   BMI 22.30 kg/m   Physical Exam  Constitutional: He is oriented to person, place, and time. He appears well-developed and well-nourished. No distress.  NAD.  HENT:  Head: Normocephalic and atraumatic.  Right Ear: External ear normal.  Left Ear: External ear normal.  Nose: Nose normal.  Eyes: Conjunctivae and EOM are normal. No scleral icterus.  Neck: Normal range of motion. Neck supple.  Cardiovascular: Normal rate, regular rhythm and normal heart sounds.  Pulmonary/Chest: Effort normal and breath sounds normal.  Abdominal: Soft. Normal appearance. There is tenderness in the epigastric area.  Minimal guarding with deep pressure of epigastrium. No R/R. No distention. Normal BS. Surgical incisions dry healing well without erythema, edema, warmth, drainage. No suprapubic or CVA tenderness.   Musculoskeletal: Normal range of motion. He exhibits no deformity.  Neurological: He is alert and oriented to person, place, and  time.  Skin: Skin is warm and dry. Capillary refill takes less than 2 seconds.  Psychiatric: He has a normal mood and affect. His behavior is normal. Judgment and thought content normal.  Nursing note and vitals reviewed.    ED Treatments / Results  Labs (all labs ordered are listed, but only abnormal results are displayed) Labs Reviewed  LIPASE, BLOOD - Abnormal; Notable for the following components:      Result Value   Lipase 56 (*)    All other components within normal limits  COMPREHENSIVE METABOLIC PANEL - Abnormal; Notable for the following components:   AST 47 (*)    ALT 57 (*)    All other components within normal limits  CBC - Abnormal; Notable for the following components:   RBC 4.12 (*)    All other components within normal limits  URINALYSIS, ROUTINE W REFLEX MICROSCOPIC - Abnormal; Notable for the following components:   Hgb urine dipstick LARGE (*)    RBC / HPF >  50 (*)    All other components within normal limits    EKG None  Radiology Ct Abdomen Pelvis W Contrast  Result Date: 10/27/2017 CLINICAL DATA:  Gallbladder out last Wednesday. Abdominal pain and bloating. EXAM: CT ABDOMEN AND PELVIS WITH CONTRAST TECHNIQUE: Multidetector CT imaging of the abdomen and pelvis was performed using the standard protocol following bolus administration of intravenous contrast. CONTRAST:  12mL OMNIPAQUE IOHEXOL 300 MG/ML  SOLN COMPARISON:  10/19/2017 FINDINGS: Lower chest: No acute abnormality. Hepatobiliary: No focal liver abnormality. Status post cholecystectomy. Soft tissue stranding within the gallbladder fossa identified. Likely postoperative. A trace amount peri hepatic fluid is noted along the undersurface of the right lobe. No fluid collections identified to suggest biloma. No biliary dilatation. Pancreas: Mild diffuse edema and peripancreatic soft tissue stranding identified. No pancreatic necrosis, mass or pseudocyst. Spleen: Normal in size without focal abnormality.  Adrenals/Urinary Tract: Normal adrenal glands. Rudimentary atrophic right kidney noted. Normal appearance of the left kidney. No mass or hydronephrosis noted. The urinary bladder appears normal. Stomach/Bowel: Stomach normal. No dilated loops of small bowel identified. The appendix is visualized and appears normal. No pathologic dilatation of the colon. Vascular/Lymphatic: Normal appearance of the abdominal aorta. No enlarged retroperitoneal or mesenteric adenopathy. No enlarged pelvic or inguinal lymph nodes. Reproductive: Prostate gland is unremarkable. Other: There is gas identified within the soft tissues of the ventral abdominal wall likely secondary to laparoscopic ports. Musculoskeletal: No acute or significant osseous findings. IMPRESSION: 1. Status post cholecystectomy. 2. Mild pancreatic edema with peripancreatic inflammation compatible with uncomplicated acute pancreatitis. Electronically Signed   By: Kerby Moors M.D.   On: 10/27/2017 15:44    Procedures Procedures (including critical care time)  Medications Ordered in ED Medications  iohexol (OMNIPAQUE) 300 MG/ML solution 80 mL (100 mLs Intravenous Contrast Given 10/27/17 1517)     Initial Impression / Assessment and Plan / ED Course  I have reviewed the triage vital signs and the nursing notes.  Pertinent labs & imaging results that were available during my care of the patient were reviewed by me and considered in my medical decision making (see chart for details).  Clinical Course as of Oct 27 1629  Sun Oct 27, 2017  1424 Hgb urine dipstick(!): LARGE [CG]  1425 RBC / HPF(!): >50 [CG]  1425 WBC, UA: 6-10 [CG]  1541 AST(!): 47 [CG]  1541 ALT(!): 57 [CG]  1541 Lipase(!): 56 [CG]    Clinical Course User Index [CG] Kinnie Feil, PA-C    47 year old with history of chronic pancreatitis status post recent cholecystectomy 5 days ago here for return of epigastric abdominal pain.  No constitutional symptoms, nausea,  vomiting, hematemesis, blood in stool.  No relapse of EtOH.  Concern for pancreatitis versus leak from surgery.  Less likely dissection, ruptured viscus, ulcer.  AST 47, ALT 57, lipase 56.  When compared to previous these are not significantly changed.  His lipase numbers chronically elevated.  Hemoglobin and urine.  He has no urinary symptoms, suprapubic or CVA tenderness.  CT scan shows mild pancreatic inflammation consistent with uncomplicated pancreatitis.  Patient's pain has significantly improved in the ER.  No episodes of emesis.  Feel patient is adequate for discharge with clear fluids, tramadol that he has at home.  He has follow-up with surgery in 2 weeks.  Discussed return precautions.  Patient is in agreement.  Final Clinical Impressions(s) / ED Diagnoses   Final diagnoses:  Chronic pancreatitis, unspecified pancreatitis type [facility])    ED Discharge  Orders    None       Arlean Hopping 10/27/17 1631    Lacretia Leigh, MD 10/28/17 [phone]

## 2017-10-27 NOTE — ED Notes (Signed)
Patient transported to CT 

## 2017-10-27 NOTE — Discharge Instructions (Addendum)
You were seen in the emergency department for pain in the abdomen.  Your lipase number is 56.  Your CT scan shows very mild inflammation around your pancreas which is pretty much unchanged from the way your pancreas has looked in the past.  No leakage from your recent surgery.  We will discharge you with liquid diet for the next 48 hours.  Continue using tramadol for pain.  Avoid ibuprofen, Aleve, Advil, alcohol.  Check in with surgeon in the next 3 to 4 days if symptoms are not improving.  Can wait until scheduled follow up if symptoms improve or resolve. Return to the ER for worsening abdominal pain, nausea, vomiting, fevers, chills, blood in your vomit, blood in your stools.

## 2017-10-30 ENCOUNTER — Telehealth: Payer: Self-pay

## 2017-10-30 NOTE — Telephone Encounter (Signed)
He has taken Melatonin in the past.  Advised to start at the lowest effective dose, can increase if necessary.  Melatonin use has been studied in post-op cholecystectomy patients.

## 2017-10-31 NOTE — Progress Notes (Signed)
Reviewed and agree with documentation and assessment and plan. K. Veena Jack Mineau , MD   

## 2017-11-12 ENCOUNTER — Ambulatory Visit (INDEPENDENT_AMBULATORY_CARE_PROVIDER_SITE_OTHER): Payer: Managed Care, Other (non HMO) | Admitting: Nurse Practitioner

## 2017-11-12 ENCOUNTER — Encounter: Payer: Self-pay | Admitting: Nurse Practitioner

## 2017-11-12 VITALS — BP 118/72 | HR 80 | Ht 73.0 in | Wt 169.5 lb

## 2017-11-12 DIAGNOSIS — K859 Acute pancreatitis without necrosis or infection, unspecified: Secondary | ICD-10-CM | POA: Diagnosis not present

## 2017-11-12 NOTE — Patient Instructions (Addendum)
If you are age 48 or older, your body mass index should be between 23-30. Your Body mass index is 22.36 kg/m. If this is out of the aforementioned range listed, please consider follow up with your Primary Care Provider.  If you are age 10 or younger, your body mass index should be between 19-25. Your Body mass index is 22.36 kg/m. If this is out of the aformentioned range listed, please consider follow up with your Primary Care Provider.   Call if you have any recurrent abdominal pain.  Follow up as needed.  Thank you for choosing me and [facility].   Tye Savoy, NP

## 2017-11-12 NOTE — Progress Notes (Signed)
P Primary GI:  Harl Bowie, MD     Chief Complaint:   Hospital follow up   IMPRESSION and PLAN:    47 yo male with recurrent episodes of acute pancreatitis of unclear etiology. ETOH possible cause in beginning but not with subsequent episodes.   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 Kyle Berry underwent empiric lap cholecystectomy 7/10 for ? Microlithiasis.  -He has had a few episodes of very transient upper abdominal pain occurring in the morning but didn't feel like pancreatitis. ED with one of the episodes and CT scan showed mild pancreatic edema but this could have been residual. His lipase was 56. Overall, Kyle Berry feels much better.  -MRI in 2018 raised concern for possible chronic pancreatis but this wasn't mentioned on follow MRI in July. Kyle Berry is taking pancreatic enzymes but if no evidence for chronic pancreatitis then they may not be of benefit. - Advised not to drink any ETOH for now as it will only confuse things should he get another episode of pancreatitis.  -he is returning to work in a couple of weeks as soon as Surgery will clear him to lift boxes  -He will contact us asap for any abdominal pain reminiscent of pancreatitis.     HPI:     Patient is a 47 yo male with a history of recurrent acute pancreatitis.  Initially the culprit may have been alcohol but he had numerous documented episodes of pancreatitis despite abstinence.  He was taking an ACE inhibitor for hypertension but that was stopped, he continued to have recurrent episodes . Kyle Berry was hospitalized late June with another episode of pancreatitis, I saw him in follow-up on 10/18/17 for persistent upper abdominal pain, anorexia and hematemesis.  I sent him to the emergency department, he was admitted for further work-up.  Overall the hematemesis was low volume, self-limiting and felt to be from esophagitis related to vomiting versus Mallory-Weiss tear.  He did not  require upper endoscopy for this. Surgery was consulted for consideration of a cholecystectomy for possible microlithiasis as no other cause for pancreatitis had been found.   Kyle Berry underwent empiric cholecystectomy 7/10. Overall he feels a lot better. He did go back to ED on 10/27/17 for upper abdominal pain. The pain had nearly resolved by the time of ED arrival but he was evaluated anyway. Repeat CT scan with findings of mild pancreatic edema. Lipase was 56. He has had the same pain again a few times, mainly in am before meals. Pain lasts 5-10 minutes and then spontaneously resolves. He says the pain doesn't feel like pancreatitis. His appetite is fine. No N/V. Some occasional loose stools post-cholecystectomy  Review of systems:     No chest pain, no SOB, no fevers, no urinary sx   Past Medical History:  Diagnosis Date  . Acute pancreatitis 11/23/2015  . Chronic bronchitis ([place])   . Chronic lower back pain   . Congenital single kidney   . GERD (gastroesophageal reflux disease)   . Headache    "once/month" (05/13/2017)  . High cholesterol   . History of kidney stones   . Hypertension   . Migraine    "a couple/year" (05/13/2017)  . Pneumonia ~ 09/2015  . Recurrent acute pancreatitis   . Renal disorder     Patient's surgical history, family medical history, social history, medications and allergies were all reviewed in Epic   Serum creatinine: 1.18 mg/dL 10/27/17 1152 Estimated creatinine clearance: 83.3 mL/min  Physical Exam:     BP 118/72   Pulse 80   Ht 6\' 1"  (1.854 m)   Wt 169 lb 8 oz (76.9 kg)   BMI 22.36 kg/m   GENERAL:  Pleasant male in NAD PSYCH: : Cooperative, normal affect EENT:  conjunctiva pink, mucous membranes moist, neck supple without masses CARDIAC:  RRR, no murmur heard, no peripheral edema PULM: Normal respiratory effort, lungs CTA bilaterally, no wheezing ABDOMEN:  Nondistended, soft, nontender. No obvious masses, no hepatomegaly,  normal bowel  sounds SKIN:  turgor, no lesions seen Musculoskeletal:  Normal muscle tone, normal strength NEURO: Alert and oriented x 3, no focal neurologic deficits   Tye Savoy , NP 11/12/2017, 2:33 PM

## 2017-11-20 NOTE — Progress Notes (Signed)
Reviewed and agree with documentation and assessment and plan. K. Veena Letoya Stallone , MD   

## 2018-01-20 ENCOUNTER — Emergency Department (HOSPITAL_COMMUNITY): Payer: Managed Care, Other (non HMO)

## 2018-01-20 ENCOUNTER — Emergency Department (HOSPITAL_COMMUNITY)
Admission: EM | Admit: 2018-01-20 | Discharge: 2018-01-21 | Disposition: A | Discharge status: Home / Self Care | Payer: Managed Care, Other (non HMO) | Attending: Emergency Medicine | Admitting: Emergency Medicine

## 2018-01-20 ENCOUNTER — Encounter (HOSPITAL_COMMUNITY): Payer: Self-pay | Admitting: Emergency Medicine

## 2018-01-20 ENCOUNTER — Other Ambulatory Visit: Payer: Self-pay

## 2018-01-20 DIAGNOSIS — R079 Chest pain, unspecified: Secondary | ICD-10-CM | POA: Insufficient documentation

## 2018-01-20 DIAGNOSIS — Z79899 Other long term (current) drug therapy: Secondary | ICD-10-CM | POA: Insufficient documentation

## 2018-01-20 DIAGNOSIS — I10 Essential (primary) hypertension: Secondary | ICD-10-CM | POA: Insufficient documentation

## 2018-01-20 DIAGNOSIS — R0602 Shortness of breath: Secondary | ICD-10-CM

## 2018-01-20 LAB — CBC
HCT: 40.5 % (ref 39.0–52.0)
HEMOGLOBIN: 13.4 g/dL (ref 13.0–17.0)
MCH: 32.2 pg (ref 26.0–34.0)
MCHC: 33.1 g/dL (ref 30.0–36.0)
MCV: 97.4 fL (ref 78.0–100.0)
Platelets: 203 10*3/uL (ref 150–400)
RBC: 4.16 MIL/uL — ABNORMAL LOW (ref 4.22–5.81)
RDW: 14.1 % (ref 11.5–15.5)
WBC: 7.8 10*3/uL (ref 4.0–10.5)

## 2018-01-20 LAB — BASIC METABOLIC PANEL
Anion gap: 11 (ref 5–15)
BUN: 14 mg/dL (ref 6–20)
CHLORIDE: 105 mmol/L (ref 98–111)
CO2: 20 mmol/L — AB (ref 22–32)
Calcium: 9 mg/dL (ref 8.9–10.3)
Creatinine, Ser: 1.11 mg/dL (ref 0.61–1.24)
GFR calc Af Amer: >60 mL/min (ref >60)
GFR calc non Af Amer: >60 mL/min (ref >60)
GLUCOSE: 135 mg/dL — AB (ref 70–99)
POTASSIUM: 3.3 mmol/L — AB (ref 3.5–5.1)
Sodium: 136 mmol/L (ref 135–145)

## 2018-01-20 LAB — I-STAT TROPONIN, ED: Troponin i, poc: 0 ng/mL (ref 0.00–0.08)

## 2018-01-20 LAB — BRAIN NATRIURETIC PEPTIDE: B Natriuretic Peptide: 27.6 pg/mL (ref 0.0–100.0)

## 2018-01-20 LAB — D-DIMER, QUANTITATIVE: D-Dimer, Quant: <0.27 ug/mL-FEU (ref 0.00–0.50)

## 2018-01-20 MED ORDER — IPRATROPIUM-ALBUTEROL 0.5-2.5 (3) MG/3ML IN SOLN
3.0000 mL | Freq: Once | RESPIRATORY_TRACT | Status: AC
  Administered 2018-01-20: 3 mL via RESPIRATORY_TRACT
  Filled 2018-01-20: qty 3

## 2018-01-20 NOTE — ED Triage Notes (Signed)
Patient with shortness of breath.  He states that his PCP changed his BP meds to Telmisartan from amlodipine due to putting fluid in his lungs.  He states that he is feeling more short of breath, especially at night.  Patient has chest pain that comes and goes.

## 2018-01-20 NOTE — ED Provider Notes (Signed)
[facility] EMERGENCY DEPARTMENT Provider Note   CSN: [id number] Arrival date & time: 01/20/18  2140     History   Chief Complaint Chief Complaint  Patient presents with  . Shortness of Breath    HPI Kyle Berry is a 47 y.o. male.  HPI  This is a 47 year old male with history of chronic pancreatitis, chronic bronchitis, hypertension, hypercholesterolemia who presents with shortness of breath.  Patient reports 2-day history of worsening shortness of breath.  Shortness of breath is worse at night and when he lays flat.  He also reports occasional chest pain on exertion.  This is new over the last 2 days.  He is not having any chest pain at this time.  Patient denies any history of heart failure but states that he "had fluid on my lungs with my last blood pressure medication."  He was switched from amlodipine to a ARB.  Reports he has not taken his blood pressure medications in the last 2 days.  Patient also reports that last week he felt like he was getting a cold.  He has had a nonproductive cough.  He is a non-smoker.  Past Medical History:  Diagnosis Date  . Acute pancreatitis 11/23/2015  . Chronic bronchitis ([place])   . Chronic lower back pain   . Congenital single kidney   . GERD (gastroesophageal reflux disease)   . Headache    "once/month" (05/13/2017)  . High cholesterol   . History of kidney stones   . Hypertension   . Migraine    "a couple/year" (05/13/2017)  . Pneumonia ~ 09/2015  . Recurrent acute pancreatitis   . Renal disorder     Patient Active Problem List   Diagnosis Date Noted  . Malnutrition of moderate degree 10/20/2017  . Hematemesis 10/18/2017  . Pancreatitis 10/12/2017  . Generalized abdominal pain   . Chest pain 10/11/2017  . Pancreatitis, recurrent 08/25/2017  . Recurrent pancreatitis 05/13/2017  . GERD (gastroesophageal reflux disease) 05/13/2017  . Congenital single kidney 05/13/2017  . High cholesterol 05/13/2017  .  Abnormal urinalysis 05/13/2017  . Nephrolithiasis 05/13/2017  . Acute on chronic pancreatitis ([place]) 01/17/2017  . Acute recurrent pancreatitis   . History of alcohol abuse   . Hypokalemia   . Non-intractable vomiting with nausea   . Essential hypertension   . Esophageal reflux   . Chronic pancreatitis ([place]) 11/23/2015    Past Surgical History:  Procedure Laterality Date  . CHOLECYSTECTOMY N/A 10/23/2017   Procedure: LAPAROSCOPIC CHOLECYSTECTOMY WITH INTRAOPERATIVE CHOLANGIOGRAM;  Surgeon: Johnathan Hausen, MD;  Location: [facility];  Service: General;  Laterality: N/A;  . NO PAST SURGERIES          Home Medications    Prior to Admission medications   Medication Sig Start Date End Date Taking? Authorizing Provider  albuterol (PROVENTIL HFA;VENTOLIN HFA) 108 (90 Base) MCG/ACT inhaler Inhale 1-2 puffs into the lungs every 4 (four) hours as needed for wheezing or shortness of breath.   Yes [provider]  lipase/protease/amylase (CREON) 36000 UNITS CPEP capsule Take 1 capsule (36,000 Units total) by mouth See admin instructions. Take one capsules (36,000 units) by mouth with meals (2 meals daily) and one capsule (36000 units) with snacks daily 10/14/17  Yes Emokpae, [name], MD  telmisartan (MICARDIS) 40 MG tablet Take 40 mg by mouth daily. 01/02/18  Yes [provider]  famotidine (PEPCID) 20 MG tablet Take 1 tablet (20 mg total) by mouth 2 (two) times daily. Patient not taking:  Reported on 01/20/2018 10/14/17   Roxan Hockey, MD  feeding supplement (BOOST / RESOURCE BREEZE) LIQD Take 1 Container by mouth 3 (three) times daily between meals. Patient not taking: Reported on 01/20/2018 01/20/17   Cristal Ford, DO  promethazine (PHENERGAN) 25 MG tablet Take 1 tablet (25 mg total) by mouth every 6 (six) hours as needed for nausea or vomiting. Patient not taking: Reported on 01/20/2018 10/14/17   Roxan Hockey, MD    Family History Family History  Problem Relation Age of  Onset  . Hypertension Other   . Hypertension Mother   . Hypertension Father   . Kidney disease Father   . Hypertension Sister   . Diabetes Sister   . Hypertension Brother     Social History Social History   Tobacco Use  . Smoking status: Never Smoker  . Smokeless tobacco: Never Used  Substance Use Topics  . Alcohol use: Not Currently    Comment: 05/13/2017 "might have beer a few times/year"  . Drug use: No     Allergies   Diclofenac; Norvasc [amlodipine besylate]; Omeprazole; and Prednisone   Review of Systems Review of Systems  Constitutional: Negative for fever.  Respiratory: Positive for cough and shortness of breath. Negative for wheezing.   Cardiovascular: Positive for chest pain. Negative for leg swelling.  Gastrointestinal: Negative for abdominal pain, nausea and vomiting.  Genitourinary: Negative for dysuria.  All other systems reviewed and are negative.    Physical Exam Updated Vital Signs BP 119/74   Pulse 98   Temp 98.7 F (37.1 C) (Oral)   Resp 18   SpO2 98%   Physical Exam  Constitutional: He is oriented to person, place, and time. He appears well-developed and well-nourished. No distress.  HENT:  Head: Normocephalic and atraumatic.  Eyes: Pupils are equal, round, and reactive to light.  Neck: Neck supple.  Cardiovascular: Normal rate, regular rhythm and normal heart sounds.  No murmur heard. Pulmonary/Chest: Effort normal and breath sounds normal. No respiratory distress. He has no decreased breath sounds. He has no wheezes.  Abdominal: Soft. Bowel sounds are normal. There is no tenderness. There is no rebound.  Musculoskeletal: He exhibits no edema.       Right lower leg: He exhibits no tenderness and no edema.       Left lower leg: He exhibits no tenderness and no edema.  Lymphadenopathy:    He has no cervical adenopathy.  Neurological: He is alert and oriented to person, place, and time.  Skin: Skin is warm and dry.  Psychiatric: He has  a normal mood and affect.  Nursing note and vitals reviewed.    ED Treatments / Results  Labs (all labs ordered are listed, but only abnormal results are displayed) Labs Reviewed  BASIC METABOLIC PANEL - Abnormal; Notable for the following components:      Result Value   Potassium 3.3 (*)    CO2 20 (*)    Glucose, Bld 135 (*)    All other components within normal limits  CBC - Abnormal; Notable for the following components:   RBC 4.16 (*)    All other components within normal limits  BRAIN NATRIURETIC PEPTIDE  D-DIMER, QUANTITATIVE (NOT AT [facility])  I-STAT TROPONIN, ED    EKG EKG Interpretation  Date/Time:  Monday January 20 2018 21:57:06 EDT Ventricular Rate:  96 PR Interval:  152 QRS Duration: 82 QT Interval:  338 QTC Calculation: 427 R Axis:   37 Text Interpretation:  Normal sinus rhythm Septal  infarct , age undetermined Abnormal ECG Confirmed by Thayer Jew [phone]) on 01/20/2018 10:59:25 PM   Radiology Dg Chest 2 View  Result Date: 01/20/2018 CLINICAL DATA:  Dyspnea EXAM: CHEST - 2 VIEW COMPARISON:  10/18/2017 FINDINGS: The heart size and mediastinal contours are within normal limits. Chronic subpleural atelectasis in the left upper lobe and medial left lung base. No pulmonary consolidation, overt pulmonary edema, effusion or pneumothorax. The visualized skeletal structures are unremarkable. IMPRESSION: No active cardiopulmonary disease. Electronically Signed   By: Ashley Royalty M.D.   On: 01/20/2018 22:09    Procedures Procedures (including critical care time)  Medications Ordered in ED Medications  albuterol (PROVENTIL HFA;VENTOLIN HFA) 108 (90 Base) MCG/ACT inhaler 2 puff (has no administration in time range)  ipratropium-albuterol (DUONEB) 0.5-2.5 (3) MG/3ML nebulizer solution 3 mL (3 mLs Nebulization Given 01/20/18 2320)  ondansetron (ZOFRAN-ODT) disintegrating tablet 4 mg (4 mg Oral Given 01/21/18 0036)     Initial Impression / Assessment and Plan / ED  Course  I have reviewed the triage vital signs and the nursing notes.  Pertinent labs & imaging results that were available during my care of the patient were reviewed by me and considered in my medical decision making (see chart for details).  Clinical Course as of Jan 21 122  Tue Jan 21, 2018  0118 Felt better after DuoNeb.  Reports history of chronic bronchitis.  He is a non-smoker.  No documented history of asthma.  No wheezing on exam.  He does have an inhaler at home but it is out of date.  Will provide with an inhaler.  Other work-up is reassuring.  He is ambulating with pulse ox.   [CH]    Clinical Course User Index [CH] Horton, Barbette Hair, MD    Patient presents with shortness of breath.  He is overall nontoxic-appearing vital signs are reassuring.  He is afebrile.  EKG shows no signs of ischemia.  Troponin is negative.  He does report some intermittent chest pain but is currently chest pain-free.  Risk factors include hypertension.  He is not wheezing on exam but does have a history of bronchitis.  He is a non-smoker.  Reports some upper respiratory symptoms recently.  Patient was given a DuoNeb to see if this helps.  Chest x-ray shows no evidence of pneumothorax or pneumonia.  D-dimer is negative for blood clot.  Patient did report some improvement after DuoNeb and felt like "it broke up the mucus."  He does not have an inhaler at home.  He was provided with an inhaler.  We will have him follow-up closely with his primary physician.  Given chest pain, would also have him follow-up with cardiology for possible stress testing.  After history, exam, and medical workup I feel the patient has been appropriately medically screened and is safe for discharge home. Pertinent diagnoses were discussed with the patient. Patient was given return precautions.   Final Clinical Impressions(s) / ED Diagnoses   Final diagnoses:  Shortness of breath    ED Discharge Orders    None       Merryl Hacker, MD 01/21/18 [phone]

## 2018-01-21 MED ORDER — ALBUTEROL SULFATE HFA 108 (90 BASE) MCG/ACT IN AERS
2.0000 | INHALATION_SPRAY | Freq: Once | RESPIRATORY_TRACT | Status: AC
  Administered 2018-01-21: 2 via RESPIRATORY_TRACT

## 2018-01-21 MED ORDER — ALBUTEROL SULFATE HFA 108 (90 BASE) MCG/ACT IN AERS
INHALATION_SPRAY | RESPIRATORY_TRACT | Status: AC
  Filled 2018-01-21: qty 6.7

## 2018-01-21 MED ORDER — ONDANSETRON 4 MG PO TBDP
4.0000 mg | ORAL_TABLET | Freq: Once | ORAL | Status: AC
  Administered 2018-01-21: 4 mg via ORAL
  Filled 2018-01-21: qty 1

## 2018-01-21 NOTE — Discharge Instructions (Addendum)
Were seen today for shortness of breath.  Your work-up is largely reassuring.  He has no pneumonia or fluid on your x-ray.  Use your inhaler as needed.  Follow-up with your primary physician.  Also follow-up with cardiology.  Given your symptoms on exertion, you may need to have a stress test to rule out heart disease.

## 2018-01-21 NOTE — ED Notes (Signed)
Ambulated Pt while on Pulse Ox. Pt started at 98%. Pt stayed between 96%-98% while walking. Pt currently back in bed and Pulse Ox is 100%. Pt stated he did feel a little more short of breath after the walk but that he was feeling better.

## 2018-04-05 ENCOUNTER — Other Ambulatory Visit: Payer: Self-pay

## 2018-04-05 ENCOUNTER — Encounter (HOSPITAL_COMMUNITY): Payer: Self-pay | Admitting: Emergency Medicine

## 2018-04-05 ENCOUNTER — Emergency Department (HOSPITAL_COMMUNITY)
Admission: EM | Admit: 2018-04-05 | Discharge: 2018-04-06 | Disposition: A | Discharge status: Home / Self Care | Payer: Managed Care, Other (non HMO) | Source: Home / Self Care | Attending: Emergency Medicine | Admitting: Emergency Medicine

## 2018-04-05 DIAGNOSIS — E7800 Pure hypercholesterolemia, unspecified: Secondary | ICD-10-CM

## 2018-04-05 DIAGNOSIS — I10 Essential (primary) hypertension: Secondary | ICD-10-CM

## 2018-04-05 DIAGNOSIS — Z79899 Other long term (current) drug therapy: Secondary | ICD-10-CM

## 2018-04-05 DIAGNOSIS — K859 Acute pancreatitis without necrosis or infection, unspecified: Secondary | ICD-10-CM | POA: Diagnosis not present

## 2018-04-05 DIAGNOSIS — K8500 Idiopathic acute pancreatitis without necrosis or infection: Secondary | ICD-10-CM | POA: Insufficient documentation

## 2018-04-05 LAB — CBC WITH DIFFERENTIAL/PLATELET
Abs Immature Granulocytes: 0.02 10*3/uL (ref 0.00–0.07)
Basophils Absolute: 0 10*3/uL (ref 0.0–0.1)
Basophils Relative: 0 %
EOS PCT: 1 %
Eosinophils Absolute: 0.1 10*3/uL (ref 0.0–0.5)
HCT: 41.5 % (ref 39.0–52.0)
HEMOGLOBIN: 13.7 g/dL (ref 13.0–17.0)
Immature Granulocytes: 0 %
LYMPHS PCT: 34 %
Lymphs Abs: 2.6 10*3/uL (ref 0.7–4.0)
MCH: 31.4 pg (ref 26.0–34.0)
MCHC: 33 g/dL (ref 30.0–36.0)
MCV: 95 fL (ref 80.0–100.0)
MONO ABS: 0.5 10*3/uL (ref 0.1–1.0)
MONOS PCT: 6 %
Neutro Abs: 4.6 10*3/uL (ref 1.7–7.7)
Neutrophils Relative %: 59 %
Platelets: 178 10*3/uL (ref 150–400)
RBC: 4.37 MIL/uL (ref 4.22–5.81)
RDW: 12.5 % (ref 11.5–15.5)
WBC: 7.9 10*3/uL (ref 4.0–10.5)
nRBC: 0 % (ref 0.0–0.2)

## 2018-04-05 MED ORDER — MORPHINE SULFATE (PF) 4 MG/ML IV SOLN
4.0000 mg | Freq: Once | INTRAVENOUS | Status: AC
  Administered 2018-04-05: 4 mg via INTRAVENOUS
  Filled 2018-04-05: qty 1

## 2018-04-05 MED ORDER — SODIUM CHLORIDE 0.9 % IV BOLUS
1000.0000 mL | Freq: Once | INTRAVENOUS | Status: AC
  Administered 2018-04-05: 1000 mL via INTRAVENOUS

## 2018-04-05 MED ORDER — ONDANSETRON HCL 4 MG/2ML IJ SOLN
4.0000 mg | Freq: Once | INTRAMUSCULAR | Status: AC
  Administered 2018-04-05: 4 mg via INTRAVENOUS
  Filled 2018-04-05: qty 2

## 2018-04-05 MED ORDER — ALUM & MAG HYDROXIDE-SIMETH 200-200-20 MG/5ML PO SUSP
15.0000 mL | Freq: Once | ORAL | Status: AC
  Administered 2018-04-05: 15 mL via ORAL
  Filled 2018-04-05: qty 30

## 2018-04-05 NOTE — ED Notes (Signed)
Nurse starting IV and will draw labs. 

## 2018-04-05 NOTE — ED Triage Notes (Signed)
Pt reports upper abd pain that started today with N/V/D. Also back pain for the last two weeks d/t his job. Hx of cholecystectomy.

## 2018-04-05 NOTE — ED Provider Notes (Signed)
[facility] EMERGENCY DEPARTMENT Provider Note   CSN: [id number] Arrival date & time: 04/05/18  2148     History   Chief Complaint Chief Complaint  Patient presents with  . Abdominal Pain    HPI Kyle Berry is a 47 y.o. male.  47 yo M with a chief complaint of left lower back pain and right upper abdominal pain.  Back pain is been going on for about a week or so.  He denies trauma.  Thinks he may have injured it at work.  Worse with bending twisting palpation.  Denies radiation of the pain.  Described as dull.  Denies loss of bowel or bladder denies loss.  Sensation denies weakness or numbness in the leg.  Patient has had pain in his abdomen off and on for some time.  Worsening over the past few days and had an episode of emesis today that had him come in.  No fevers that he knows of but has felt hot off and on.  Pain is right-sided and radiates up into the middle of his abdomen.  Sometimes occurs after he eats.  Has a history of gallstone pancreatitis and had his gallbladder removed about 5 months ago since then he has been feeling much better until about a week ago.  Emesis was nonbilious nonbloody.  Denies diarrhea.  Denies sick contacts.  The history is provided by the patient.  Abdominal Pain   This is a recurrent problem. The current episode started more than 2 days ago. The problem occurs constantly. The problem has been gradually worsening. The pain is associated with eating. The pain is located in the epigastric region and RUQ. The quality of the pain is sharp, shooting and cramping. The pain is at a severity of 8/10. The pain is moderate. Associated symptoms include vomiting. Pertinent negatives include fever, diarrhea, headaches, arthralgias and myalgias. The symptoms are aggravated by eating. Nothing relieves the symptoms. Past workup includes GI consult, CT scan, ultrasound and surgery. Past medical history comments: pancreatitis.    Past Medical  History:  Diagnosis Date  . Acute pancreatitis 11/23/2015  . Chronic bronchitis ([place])   . Chronic lower back pain   . Congenital single kidney   . GERD (gastroesophageal reflux disease)   . Headache    "once/month" (05/13/2017)  . High cholesterol   . History of kidney stones   . Hypertension   . Migraine    "a couple/year" (05/13/2017)  . Pneumonia ~ 09/2015  . Recurrent acute pancreatitis   . Renal disorder     Patient Active Problem List   Diagnosis Date Noted  . Malnutrition of moderate degree 10/20/2017  . Hematemesis 10/18/2017  . Pancreatitis 10/12/2017  . Generalized abdominal pain   . Chest pain 10/11/2017  . Pancreatitis, recurrent 08/25/2017  . Recurrent pancreatitis 05/13/2017  . GERD (gastroesophageal reflux disease) 05/13/2017  . Congenital single kidney 05/13/2017  . High cholesterol 05/13/2017  . Abnormal urinalysis 05/13/2017  . Nephrolithiasis 05/13/2017  . Acute on chronic pancreatitis ([place]) 01/17/2017  . Acute recurrent pancreatitis   . History of alcohol abuse   . Hypokalemia   . Non-intractable vomiting with nausea   . Essential hypertension   . Esophageal reflux   . Chronic pancreatitis ([place]) 11/23/2015    Past Surgical History:  Procedure Laterality Date  . CHOLECYSTECTOMY N/A 10/23/2017   Procedure: LAPAROSCOPIC CHOLECYSTECTOMY WITH INTRAOPERATIVE CHOLANGIOGRAM;  Surgeon: Johnathan Hausen, MD;  Location: [facility];  Service: General;  Laterality: N/A;  .  NO PAST SURGERIES          Home Medications    Prior to Admission medications   Medication Sig Start Date End Date Taking? Authorizing Provider  albuterol (PROVENTIL HFA;VENTOLIN HFA) 108 (90 Base) MCG/ACT inhaler Inhale 1-2 puffs into the lungs every 4 (four) hours as needed for wheezing or shortness of breath.   Yes [provider]  lipase/protease/amylase (CREON) 36000 UNITS CPEP capsule Take 1 capsule (36,000 Units total) by mouth See admin instructions. Take one capsules (36,000  units) by mouth with meals (2 meals daily) and one capsule (36000 units) with snacks daily 10/14/17  Yes Emokpae, [name], MD  telmisartan (MICARDIS) 40 MG tablet Take 40 mg by mouth daily. 01/02/18  Yes [provider]  famotidine (PEPCID) 20 MG tablet Take 1 tablet (20 mg total) by mouth 2 (two) times daily. Patient not taking: Reported on 01/20/2018 10/14/17   Roxan Hockey, MD  feeding supplement (BOOST / RESOURCE BREEZE) LIQD Take 1 Container by mouth 3 (three) times daily between meals. Patient not taking: Reported on 01/20/2018 01/20/17   Cristal Ford, DO  morphine (MSIR) 15 MG tablet Take 1 tablet (15 mg total) by mouth every 4 (four) hours as needed for severe pain. 04/06/18   Deno Etienne, DO  ondansetron (ZOFRAN ODT) 4 MG disintegrating tablet Take 1 tablet (4 mg total) by mouth every 8 (eight) hours as needed for nausea or vomiting. 04/06/18   Deno Etienne, DO  promethazine (PHENERGAN) 25 MG tablet Take 1 tablet (25 mg total) by mouth every 6 (six) hours as needed for nausea or vomiting. Patient not taking: Reported on 01/20/2018 10/14/17   Roxan Hockey, MD    Family History Family History  Problem Relation Age of Onset  . Hypertension Other   . Hypertension Mother   . Hypertension Father   . Kidney disease Father   . Hypertension Sister   . Diabetes Sister   . Hypertension Brother     Social History Social History   Tobacco Use  . Smoking status: Never Smoker  . Smokeless tobacco: Never Used  Substance Use Topics  . Alcohol use: Not Currently    Comment: 05/13/2017 "might have beer a few times/year"  . Drug use: No     Allergies   Diclofenac; Norvasc [amlodipine besylate]; Omeprazole; and Prednisone   Review of Systems Review of Systems  Constitutional: Negative for chills and fever.  HENT: Negative for congestion and facial swelling.   Eyes: Negative for discharge and visual disturbance.  Respiratory: Negative for shortness of breath.   Cardiovascular:  Negative for chest pain and palpitations.  Gastrointestinal: Positive for abdominal pain and vomiting. Negative for diarrhea.  Musculoskeletal: Negative for arthralgias and myalgias.  Skin: Negative for color change and rash.  Neurological: Negative for tremors, syncope and headaches.  Psychiatric/Behavioral: Negative for confusion and dysphoric mood.     Physical Exam Updated Vital Signs BP (!) 139/96   Pulse 79   Temp 98.5 F (36.9 C) (Oral)   Resp 16   SpO2 96%   Physical Exam Vitals signs and nursing note reviewed.  Constitutional:      Appearance: He is well-developed.  HENT:     Head: Normocephalic and atraumatic.  Eyes:     Pupils: Pupils are equal, round, and reactive to light.  Neck:     Musculoskeletal: Normal range of motion and neck supple.     Vascular: No JVD.  Cardiovascular:     Rate and Rhythm: Normal rate  and regular rhythm.     Heart sounds: No murmur. No friction rub. No gallop.   Pulmonary:     Effort: No respiratory distress.     Breath sounds: No wheezing.  Abdominal:     General: There is no distension.     Tenderness: There is abdominal tenderness in the right upper quadrant. There is no guarding or rebound. Negative signs include Murphy's sign and McBurney's sign.  Musculoskeletal: Normal range of motion.  Skin:    Coloration: Skin is not pale.     Findings: No rash.  Neurological:     Mental Status: He is alert and oriented to person, place, and time.  Psychiatric:        Behavior: Behavior normal.      ED Treatments / Results  Labs (all labs ordered are listed, but only abnormal results are displayed) Labs Reviewed  COMPREHENSIVE METABOLIC PANEL - Abnormal; Notable for the following components:      Result Value   CO2 21 (*)    Creatinine, Ser 1.29 (*)    All other components within normal limits  LIPASE, BLOOD - Abnormal; Notable for the following components:   Lipase 166 (*)    All other components within normal limits  CBC  WITH DIFFERENTIAL/PLATELET    EKG None  Radiology No results found.  Procedures Procedures (including critical care time)  Medications Ordered in ED Medications  morphine 4 MG/ML injection 4 mg (4 mg Intravenous Given 04/05/18 2332)  ondansetron (ZOFRAN) injection 4 mg (4 mg Intravenous Given 04/05/18 2332)  sodium chloride 0.9 % bolus 1,000 mL (0 mLs Intravenous Stopped 04/06/18 0004)  alum & mag hydroxide-simeth (MAALOX/MYLANTA) 200-200-20 MG/5ML suspension 15 mL (15 mLs Oral Given 04/05/18 2332)     Initial Impression / Assessment and Plan / ED Course  I have reviewed the triage vital signs and the nursing notes.  Pertinent labs & imaging results that were available during my care of the patient were reviewed by me and considered in my medical decision making (see chart for details).     47 yo M with a chief complaint of abdominal pain.  Had his gallbladder removed about 5 months ago.  Since then has been doing much better until about a week ago and then worsening over the past couple days.  Sometimes is worse with eating.  Mild right upper quadrant abdominal tenderness on my exam.  Patient is well-appearing and nontoxic.  Has had multiple presentations to the ED in the past for presumed chronic pancreatitis.  He thinks this feels the same.  Will check lab work give pain medicine and fluids.  Reassess.  Patient's low back pain is most likely musculoskeletal in nature.  He has no red flags and I feel that imaging is warranted at this time.  Patient's lab work does show a lipase that is elevated above 3 times the limit.  He needs to be well-appearing nontoxic and is able to tolerate p.o. here without difficulty.  Will discharge home with pain and nausea medicine.  Have him follow-up with his GI.  12:39 AM:  I have discussed the diagnosis/risks/treatment options with the patient and believe the pt to be eligible for discharge home to follow-up with PCP, GI. We also discussed returning  to the ED immediately if new or worsening sx occur. We discussed the sx which are most concerning (e.g., sudden worsening pain, fever, inability to tolerate by mouth) that necessitate immediate return. Medications administered to the patient during their  visit and any new prescriptions provided to the patient are listed below.  Medications given during this visit Medications  morphine 4 MG/ML injection 4 mg (4 mg Intravenous Given 04/05/18 2332)  ondansetron (ZOFRAN) injection 4 mg (4 mg Intravenous Given 04/05/18 2332)  sodium chloride 0.9 % bolus 1,000 mL (0 mLs Intravenous Stopped 04/06/18 0004)  alum & mag hydroxide-simeth (MAALOX/MYLANTA) 200-200-20 MG/5ML suspension 15 mL (15 mLs Oral Given 04/05/18 2332)      The patient appears reasonably screen and/or stabilized for discharge and I doubt any other medical condition or other [facility] requiring further screening, evaluation, or treatment in the ED at this time prior to discharge.    Final Clinical Impressions(s) / ED Diagnoses   Final diagnoses:  Idiopathic acute pancreatitis without infection or necrosis    ED Discharge Orders         Ordered    morphine (MSIR) 15 MG tablet  Every 4 hours PRN     04/06/18 0028    ondansetron (ZOFRAN ODT) 4 MG disintegrating tablet  Every 8 hours PRN     04/06/18 0028           Deno Etienne, DO 04/06/18 [phone]

## 2018-04-06 ENCOUNTER — Other Ambulatory Visit: Payer: Self-pay

## 2018-04-06 ENCOUNTER — Inpatient Hospital Stay (HOSPITAL_COMMUNITY)
Admission: EM | Admit: 2018-04-06 | Discharge: 2018-04-13 | DRG: 439 | Disposition: A | Discharge status: Home / Self Care | Payer: Managed Care, Other (non HMO) | Attending: Student in an Organized Health Care Education/Training Program | Admitting: Student in an Organized Health Care Education/Training Program

## 2018-04-06 ENCOUNTER — Encounter (HOSPITAL_COMMUNITY): Payer: Self-pay

## 2018-04-06 ENCOUNTER — Observation Stay (HOSPITAL_COMMUNITY): Payer: Managed Care, Other (non HMO)

## 2018-04-06 DIAGNOSIS — Q453 Other congenital malformations of pancreas and pancreatic duct: Secondary | ICD-10-CM | POA: Diagnosis not present

## 2018-04-06 DIAGNOSIS — K861 Other chronic pancreatitis: Secondary | ICD-10-CM | POA: Diagnosis not present

## 2018-04-06 DIAGNOSIS — Z79891 Long term (current) use of opiate analgesic: Secondary | ICD-10-CM

## 2018-04-06 DIAGNOSIS — Z79899 Other long term (current) drug therapy: Secondary | ICD-10-CM

## 2018-04-06 DIAGNOSIS — G8929 Other chronic pain: Secondary | ICD-10-CM | POA: Diagnosis present

## 2018-04-06 DIAGNOSIS — Z9049 Acquired absence of other specified parts of digestive tract: Secondary | ICD-10-CM

## 2018-04-06 DIAGNOSIS — Z7289 Other problems related to lifestyle: Secondary | ICD-10-CM

## 2018-04-06 DIAGNOSIS — K859 Acute pancreatitis without necrosis or infection, unspecified: Secondary | ICD-10-CM | POA: Diagnosis not present

## 2018-04-06 DIAGNOSIS — J42 Unspecified chronic bronchitis: Secondary | ICD-10-CM | POA: Diagnosis present

## 2018-04-06 DIAGNOSIS — Z833 Family history of diabetes mellitus: Secondary | ICD-10-CM

## 2018-04-06 DIAGNOSIS — Z87442 Personal history of urinary calculi: Secondary | ICD-10-CM

## 2018-04-06 DIAGNOSIS — Q600 Renal agenesis, unilateral: Secondary | ICD-10-CM

## 2018-04-06 DIAGNOSIS — Z8249 Family history of ischemic heart disease and other diseases of the circulatory system: Secondary | ICD-10-CM

## 2018-04-06 DIAGNOSIS — I10 Essential (primary) hypertension: Secondary | ICD-10-CM | POA: Diagnosis not present

## 2018-04-06 DIAGNOSIS — E860 Dehydration: Secondary | ICD-10-CM | POA: Diagnosis present

## 2018-04-06 DIAGNOSIS — M545 Low back pain: Secondary | ICD-10-CM | POA: Diagnosis present

## 2018-04-06 DIAGNOSIS — K219 Gastro-esophageal reflux disease without esophagitis: Secondary | ICD-10-CM | POA: Diagnosis present

## 2018-04-06 DIAGNOSIS — Z841 Family history of disorders of kidney and ureter: Secondary | ICD-10-CM

## 2018-04-06 DIAGNOSIS — Z888 Allergy status to other drugs, medicaments and biological substances status: Secondary | ICD-10-CM

## 2018-04-06 LAB — CBC WITH DIFFERENTIAL/PLATELET
Abs Immature Granulocytes: 0.04 10*3/uL (ref 0.00–0.07)
BASOS ABS: 0 10*3/uL (ref 0.0–0.1)
BASOS PCT: 0 %
EOS ABS: 0.1 10*3/uL (ref 0.0–0.5)
Eosinophils Relative: 1 %
HCT: 41.4 % (ref 39.0–52.0)
Hemoglobin: 13.8 g/dL (ref 13.0–17.0)
IMMATURE GRANULOCYTES: 0 %
Lymphocytes Relative: 31 %
Lymphs Abs: 2.8 10*3/uL (ref 0.7–4.0)
MCH: 32 pg (ref 26.0–34.0)
MCHC: 33.3 g/dL (ref 30.0–36.0)
MCV: 96.1 fL (ref 80.0–100.0)
Monocytes Absolute: 0.6 10*3/uL (ref 0.1–1.0)
Monocytes Relative: 7 %
NEUTROS PCT: 61 %
NRBC: 0 % (ref 0.0–0.2)
Neutro Abs: 5.4 10*3/uL (ref 1.7–7.7)
PLATELETS: 183 10*3/uL (ref 150–400)
RBC: 4.31 MIL/uL (ref 4.22–5.81)
RDW: 12.6 % (ref 11.5–15.5)
WBC: 8.9 10*3/uL (ref 4.0–10.5)

## 2018-04-06 LAB — COMPREHENSIVE METABOLIC PANEL
ALBUMIN: 3.7 g/dL (ref 3.5–5.0)
ALK PHOS: 48 U/L (ref 38–126)
ALT: 25 U/L (ref 0–44)
ALT: 25 U/L (ref 0–44)
ANION GAP: 12 (ref 5–15)
ANION GAP: 13 (ref 5–15)
AST: 28 U/L (ref 15–41)
AST: 28 U/L (ref 15–41)
Albumin: 3.7 g/dL (ref 3.5–5.0)
Alkaline Phosphatase: 49 U/L (ref 38–126)
BILIRUBIN TOTAL: 1.4 mg/dL — AB (ref 0.3–1.2)
BILIRUBIN TOTAL: 0.9 mg/dL (ref 0.3–1.2)
BUN: 12 mg/dL (ref 6–20)
BUN: 16 mg/dL (ref 6–20)
CALCIUM: 9 mg/dL (ref 8.9–10.3)
CHLORIDE: 102 mmol/L (ref 98–111)
CO2: 21 mmol/L — ABNORMAL LOW (ref 22–32)
CO2: 23 mmol/L (ref 22–32)
Calcium: 8.5 mg/dL — ABNORMAL LOW (ref 8.9–10.3)
Chloride: 106 mmol/L (ref 98–111)
Creatinine, Ser: 1.29 mg/dL — ABNORMAL HIGH (ref 0.61–1.24)
Creatinine, Ser: 1.29 mg/dL — ABNORMAL HIGH (ref 0.61–1.24)
GFR calc Af Amer: >60 mL/min (ref >60)
GFR calc Af Amer: >60 mL/min (ref >60)
GLUCOSE: 95 mg/dL (ref 70–99)
Glucose, Bld: 107 mg/dL — ABNORMAL HIGH (ref 70–99)
POTASSIUM: 3.2 mmol/L — AB (ref 3.5–5.1)
Potassium: 3.7 mmol/L (ref 3.5–5.1)
Sodium: 137 mmol/L (ref 135–145)
Sodium: 140 mmol/L (ref 135–145)
TOTAL PROTEIN: 7.1 g/dL (ref 6.5–8.1)
TOTAL PROTEIN: 7 g/dL (ref 6.5–8.1)

## 2018-04-06 LAB — LIPID PANEL
Cholesterol: 189 mg/dL (ref 0–200)
HDL: 46 mg/dL (ref >40)
LDL Cholesterol: 94 mg/dL (ref 0–99)
Total CHOL/HDL Ratio: 4.1 RATIO
Triglycerides: 244 mg/dL — ABNORMAL HIGH (ref <150)
VLDL: 49 mg/dL — ABNORMAL HIGH (ref 0–40)

## 2018-04-06 LAB — ETHANOL: Alcohol, Ethyl (B): <10 mg/dL (ref <10)

## 2018-04-06 LAB — LIPASE, BLOOD
LIPASE: 105 U/L — AB (ref 11–51)
LIPASE: 166 U/L — AB (ref 11–51)

## 2018-04-06 MED ORDER — PROMETHAZINE HCL 25 MG PO TABS: 12.5000 mg | ORAL_TABLET | Freq: Four times a day (QID) | ORAL | Status: DC | PRN

## 2018-04-06 MED ORDER — ONDANSETRON HCL 4 MG/2ML IJ SOLN
4.0000 mg | INTRAMUSCULAR | Status: AC
  Administered 2018-04-06: 4 mg via INTRAVENOUS
  Filled 2018-04-06: qty 2

## 2018-04-06 MED ORDER — ENOXAPARIN SODIUM 40 MG/0.4ML ~~LOC~~ SOLN
40.0000 mg | SUBCUTANEOUS | Status: DC
  Administered 2018-04-07 – 2018-04-13 (×7): 40 mg via SUBCUTANEOUS
  Filled 2018-04-06 (×7): qty 0.4

## 2018-04-06 MED ORDER — GADOBUTROL 1 MMOL/ML IV SOLN
7.0000 mL | Freq: Once | INTRAVENOUS | Status: AC | PRN
  Administered 2018-04-06: 7 mL via INTRAVENOUS

## 2018-04-06 MED ORDER — HYDROMORPHONE HCL 1 MG/ML IJ SOLN
1.0000 mg | Freq: Four times a day (QID) | INTRAMUSCULAR | Status: DC | PRN
  Administered 2018-04-06 – 2018-04-08 (×7): 1 mg via INTRAVENOUS
  Filled 2018-04-06 (×7): qty 1

## 2018-04-06 MED ORDER — MORPHINE SULFATE 15 MG PO TABS: 15.0000 mg | ORAL_TABLET | ORAL | 0 refills | Status: DC | PRN

## 2018-04-06 MED ORDER — HYDROMORPHONE HCL 1 MG/ML IJ SOLN
0.5000 mg | Freq: Once | INTRAMUSCULAR | Status: AC
  Administered 2018-04-06: 0.5 mg via INTRAVENOUS
  Filled 2018-04-06: qty 1

## 2018-04-06 MED ORDER — ACETAMINOPHEN 650 MG RE SUPP: 650.0000 mg | Freq: Four times a day (QID) | RECTAL | Status: DC | PRN

## 2018-04-06 MED ORDER — PROMETHAZINE HCL 25 MG/ML IJ SOLN: 12.5000 mg | Freq: Four times a day (QID) | INTRAMUSCULAR | Status: DC | PRN

## 2018-04-06 MED ORDER — IRBESARTAN 150 MG PO TABS
150.0000 mg | ORAL_TABLET | Freq: Every day | ORAL | Status: DC
  Administered 2018-04-07 – 2018-04-13 (×7): 150 mg via ORAL
  Filled 2018-04-06 (×7): qty 1

## 2018-04-06 MED ORDER — LORAZEPAM 2 MG/ML IJ SOLN
1.0000 mg | Freq: Once | INTRAMUSCULAR | Status: AC
  Administered 2018-04-06: 1 mg via INTRAVENOUS
  Filled 2018-04-06: qty 1

## 2018-04-06 MED ORDER — ONDANSETRON 4 MG PO TBDP: 4.0000 mg | ORAL_TABLET | Freq: Three times a day (TID) | ORAL | 0 refills | Status: DC | PRN

## 2018-04-06 MED ORDER — SODIUM CHLORIDE 0.9 % IV BOLUS
1000.0000 mL | Freq: Once | INTRAVENOUS | Status: AC
  Administered 2018-04-06: 1000 mL via INTRAVENOUS

## 2018-04-06 MED ORDER — PROMETHAZINE HCL 25 MG/ML IJ SOLN
25.0000 mg | Freq: Four times a day (QID) | INTRAMUSCULAR | Status: DC | PRN
  Administered 2018-04-07 – 2018-04-12 (×13): 25 mg via INTRAVENOUS
  Filled 2018-04-06 (×13): qty 1

## 2018-04-06 MED ORDER — PROMETHAZINE HCL 25 MG PO TABS
25.0000 mg | ORAL_TABLET | Freq: Four times a day (QID) | ORAL | Status: DC | PRN
  Administered 2018-04-06: 25 mg via ORAL
  Filled 2018-04-06: qty 1

## 2018-04-06 MED ORDER — PANCRELIPASE (LIP-PROT-AMYL) 12000-38000 UNITS PO CPEP
36000.0000 [IU] | ORAL_CAPSULE | Freq: Two times a day (BID) | ORAL | Status: DC
  Administered 2018-04-06 – 2018-04-13 (×13): 36000 [IU] via ORAL
  Filled 2018-04-06 (×13): qty 3

## 2018-04-06 MED ORDER — ACETAMINOPHEN 325 MG PO TABS: 650.0000 mg | ORAL_TABLET | Freq: Four times a day (QID) | ORAL | Status: DC | PRN

## 2018-04-06 MED ORDER — POTASSIUM CHLORIDE 2 MEQ/ML IV SOLN
INTRAVENOUS | Status: AC
  Administered 2018-04-06 – 2018-04-07 (×3): via INTRAVENOUS
  Filled 2018-04-06 (×5): qty 1000

## 2018-04-06 NOTE — Discharge Instructions (Addendum)
Return for worsening pain, fever, inability to eat or drink 

## 2018-04-06 NOTE — H&P (Addendum)
Date: 04/06/2018               Patient Name:  Kyle Berry MRN: [id number]  DOB: [birth year] Age / Sex: 47 y.o., male   PCP: Kerin Perna, NP         Medical Service: Internal Medicine Teaching Service         Attending Physician: Dr. Rebeca Alert Raynaldo Opitz, MD    First Contact: Dr. Sharon Seller Pager: [phone]  Second Contact: Dr. Shan Levans Pager: [phone]       After Hours (After 5p/  First Contact Pager: [phone]  weekends / holidays): Second Contact Pager: [phone]   Chief Complaint: abdominal pain   History of Present Illness:  Mr. 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 He states this began about two days ago. He has not had recurrence of pancreatitis symptoms since his cholecystectomy in July. He was last able to eat yesterday morning and last vomited this morning. He did come to the ED yesterday and was given zofran and fluid and was able to return home. His abdominal pain worsened overnight and was not relieved with zofran. He endorses lower back pain that does not radiate for the past two days.  He states he has also had loose stool for the past two days and went to the bathroom about three times yesterday. He denies recent illness, changes in medication, sick contacts, changes in urination, dysuria, SOB, or chest pain.  His pancreatitis first occurred about a year and a half ago. He states he never had gallstones but all of his symptoms stopped after his cholecystectomy. He states he has not drank in two years.  Social:  Denies tobacco use Quit drinking two years ago  Lives in [place] and has a job that requires heavy lifting  Family History:  Family History  Problem Relation Age of Onset  . Hypertension Other   . Hypertension Mother   .  Hypertension Father   . Kidney disease Father   . Hypertension Sister   . Diabetes Sister   . Hypertension Brother      Meds:  Current Meds  Medication Sig  . Artificial Tear Ointment (DRY EYES OP) Apply 1 drop to eye daily as needed (for dry eyes).  Marland Kitchen lipase/protease/amylase (CREON) 36000 UNITS CPEP capsule Take 1 capsule (36,000 Units total) by mouth See admin instructions. Take one capsules (36,000 units) by mouth with meals (2 meals daily) and one capsule (36000 units) with snacks daily  . morphine (MSIR) 15 MG tablet Take 1 tablet (15 mg total) by mouth every 4 (four) hours as needed for severe pain.  Marland Kitchen ondansetron (ZOFRAN ODT) 4 MG disintegrating tablet Take 1 tablet (4 mg total) by mouth every 8 (eight) hours as needed for nausea or vomiting.  Marland Kitchen telmisartan (MICARDIS) 40 MG tablet Take 40 mg by mouth daily.     Allergies: Allergies as of 04/06/2018 - Review Complete 04/06/2018  Allergen Reaction Noted  . Diclofenac Hives 12/14/2013  . Norvasc [amlodipine besylate] Other (See Comments) 08/15/2016  . Omeprazole Other (See Comments) 08/15/2016  . Prednisone Other (See Comments) 12/14/2013   Past Medical History:  Diagnosis Date  . Acute pancreatitis 11/23/2015  . Chronic bronchitis ([place])   . Chronic lower back pain   . Congenital single kidney   .  GERD (gastroesophageal reflux disease)   . Headache    "once/month" (05/13/2017)  . High cholesterol   . History of kidney stones   . Hypertension   . Migraine    "a couple/year" (05/13/2017)  . Pneumonia ~ 09/2015  . Recurrent acute pancreatitis   . Renal disorder      Review of Systems: A complete ROS was negative except as per HPI.   Physical Exam: Blood pressure (!) 157/101, pulse 81, temperature 98.3 F (36.8 C), temperature source Oral, resp. rate 18, SpO2 97 %.  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 He has not had any symptoms or return of pancreatitis since his cholecystectomy in July. He notes no recent changes or inciting event and denies alcohol use for the past two years. Lipase 105 and total bili 1.4.   - MRCP ordered - am CBC, CMP, lipid panel - ethanol urine - phenergan 25 mg q6h prn  - advance diet as tolerated  - 2L bolus NS ED, then D5 LR 125 cc/hr for 24 hrs. - dilaudid 1mg  q6h prn  - cont. Creon   Hypertension Blood pressure slightly elevated.   - cont. telmisartan 40 mg qd if tolerates  Diet: advance diet as tolerated VTE: lovenox IVF: 1L NS bolus Code: full   Dispo: Admit patient to Observation with expected length of stay less than 2 midnights.  SignedMarty Heck, DO 04/06/2018, 9:34 AM  Pager: [phone]

## 2018-04-06 NOTE — ED Notes (Signed)
Pt verbalizes understanding of d/c instructions. Prescriptions reviewed with patient. Pt ambulatory at d/c with all belongings.  

## 2018-04-06 NOTE — ED Notes (Signed)
PA at bedside.

## 2018-04-06 NOTE — Plan of Care (Signed)
°  Problem: Coping: °Goal: Level of anxiety will decrease °Outcome: Progressing °  °

## 2018-04-06 NOTE — ED Triage Notes (Signed)
Pt reports pain in his abdomen radiating to his back. Was seen here last night for same and dx with pancreatitis. Pt was given rx but has not picked up yet.

## 2018-04-06 NOTE — ED Provider Notes (Signed)
[facility] EMERGENCY DEPARTMENT Provider Note   CSN: [id number] Arrival date & time: 04/06/18  0813     History   Chief Complaint Chief Complaint  Patient presents with  . Abdominal Pain    HPI Kyle Berry is a 47 y.o. male With a pmh of Recurrent pancreatitis s/p cholecystectomy in July 2019 who presents with c/o "pancreatitis." The patient was seen last night for abdominal pain and noted to have a lipase of 166 without elevated WBC count or elevated liver enzymes. The patient states that he was feeling better last night when he was discharged, but woke up at home after about 1 hour of sleep with intractable pain and vomiting of NBNB vomitus. The patient decided that he could no longer tolerate his pain or vomiting and returns to the ER this morning. He states that the pain is located in his epigastrium and RUQ but radiates into his left and into his back. Breathing and movement makes it worse. Nothing seems to make it better. It is constant and unchanged since he awoke last night.   HPI  Past Medical History:  Diagnosis Date  . Acute pancreatitis 11/23/2015  . Chronic bronchitis ([place])   . Chronic lower back pain   . Congenital single kidney   . GERD (gastroesophageal reflux disease)   . Headache    "once/month" (05/13/2017)  . High cholesterol   . History of kidney stones   . Hypertension   . Migraine    "a couple/year" (05/13/2017)  . Pneumonia ~ 09/2015  . Recurrent acute pancreatitis   . Renal disorder     Patient Active Problem List   Diagnosis Date Noted  . Malnutrition of moderate degree 10/20/2017  . Hematemesis 10/18/2017  . Pancreatitis 10/12/2017  . Generalized abdominal pain   . Chest pain 10/11/2017  . Pancreatitis, recurrent 08/25/2017  . Recurrent pancreatitis 05/13/2017  . GERD (gastroesophageal reflux disease) 05/13/2017  . Congenital single kidney 05/13/2017  . High cholesterol 05/13/2017  . Abnormal urinalysis 05/13/2017   . Nephrolithiasis 05/13/2017  . Acute on chronic pancreatitis ([place]) 01/17/2017  . Acute recurrent pancreatitis   . History of alcohol abuse   . Hypokalemia   . Non-intractable vomiting with nausea   . Essential hypertension   . Esophageal reflux   . Chronic pancreatitis ([place]) 11/23/2015    Past Surgical History:  Procedure Laterality Date  . CHOLECYSTECTOMY N/A 10/23/2017   Procedure: LAPAROSCOPIC CHOLECYSTECTOMY WITH INTRAOPERATIVE CHOLANGIOGRAM;  Surgeon: Johnathan Hausen, MD;  Location: [facility] ORS;  Service: General;  Laterality: N/A;  . NO PAST SURGERIES          Home Medications    Prior to Admission medications   Medication Sig Start Date End Date Taking? Authorizing Provider  albuterol (PROVENTIL HFA;VENTOLIN HFA) 108 (90 Base) MCG/ACT inhaler Inhale 1-2 puffs into the lungs every 4 (four) hours as needed for wheezing or shortness of breath.    [provider]  famotidine (PEPCID) 20 MG tablet Take 1 tablet (20 mg total) by mouth 2 (two) times daily. Patient not taking: Reported on 01/20/2018 10/14/17   Roxan Hockey, MD  feeding supplement (BOOST / RESOURCE BREEZE) LIQD Take 1 Container by mouth 3 (three) times daily between meals. Patient not taking: Reported on 01/20/2018 01/20/17   Cristal Ford, DO  lipase/protease/amylase (CREON) 36000 UNITS CPEP capsule Take 1 capsule (36,000 Units total) by mouth See admin instructions. Take one capsules (36,000 units) by mouth with meals (2 meals daily) and one  capsule (36000 units) with snacks daily 10/14/17   Roxan Hockey, MD  morphine (MSIR) 15 MG tablet Take 1 tablet (15 mg total) by mouth every 4 (four) hours as needed for severe pain. 04/06/18   Deno Etienne, DO  ondansetron (ZOFRAN ODT) 4 MG disintegrating tablet Take 1 tablet (4 mg total) by mouth every 8 (eight) hours as needed for nausea or vomiting. 04/06/18   Deno Etienne, DO  promethazine (PHENERGAN) 25 MG tablet Take 1 tablet (25 mg total) by mouth every 6 (six)  hours as needed for nausea or vomiting. Patient not taking: Reported on 01/20/2018 10/14/17   Roxan Hockey, MD  telmisartan (MICARDIS) 40 MG tablet Take 40 mg by mouth daily. 01/02/18   [provider]    Family History Family History  Problem Relation Age of Onset  . Hypertension Other   . Hypertension Mother   . Hypertension Father   . Kidney disease Father   . Hypertension Sister   . Diabetes Sister   . Hypertension Brother     Social History Social History   Tobacco Use  . Smoking status: Never Smoker  . Smokeless tobacco: Never Used  Substance Use Topics  . Alcohol use: Not Currently    Comment: 05/13/2017 "might have beer a few times/year"  . Drug use: No     Allergies   Diclofenac; Norvasc [amlodipine besylate]; Omeprazole; and Prednisone   Review of Systems Review of Systems  Ten systems reviewed and are negative for acute change, except as noted in the HPI.   Physical Exam Updated Vital Signs BP (!) 157/101 (BP Location: Right Arm)   Pulse 81   Temp 98.3 F (36.8 C) (Oral)   Resp 18   SpO2 97%   Physical Exam Vitals signs and nursing note reviewed.  Constitutional:      General: He is not in acute distress.    Appearance: He is well-developed. He is ill-appearing. He is not diaphoretic.  HENT:     Head: Normocephalic and atraumatic.  Eyes:     General: No scleral icterus.    Conjunctiva/sclera: Conjunctivae normal.  Neck:     Musculoskeletal: Normal range of motion and neck supple.  Cardiovascular:     Rate and Rhythm: Normal rate and regular rhythm.     Heart sounds: Normal heart sounds.  Pulmonary:     Effort: Pulmonary effort is normal. No respiratory distress.     Breath sounds: Normal breath sounds.  Abdominal:     General: There is no distension.     Palpations: Abdomen is soft.     Tenderness: There is abdominal tenderness in the right upper quadrant, epigastric area and left upper quadrant. There is guarding. There is no  right CVA tenderness, left CVA tenderness or rebound.     Hernia: No hernia is present.  Skin:    General: Skin is warm and dry.     Capillary Refill: Capillary refill takes less than 2 seconds.  Neurological:     Mental Status: He is alert.  Psychiatric:        Behavior: Behavior normal.      ED Treatments / Results  Labs (all labs ordered are listed, but only abnormal results are displayed) Labs Reviewed - No data to display  Results for orders placed or performed during the hospital encounter of 04/05/18  CBC with Differential  Result Value Ref Range   WBC 7.9 4.0 - 10.5 K/uL   RBC 4.37 4.22 - 5.81 MIL/uL  Hemoglobin 13.7 13.0 - 17.0 g/dL   HCT 41.5 39.0 - 52.0 %   MCV 95.0 80.0 - 100.0 fL   MCH 31.4 26.0 - 34.0 pg   MCHC 33.0 30.0 - 36.0 g/dL   RDW 12.5 11.5 - 15.5 %   Platelets 178 150 - 400 K/uL   nRBC 0.0 0.0 - 0.2 %   Neutrophils Relative % 59 %   Neutro Abs 4.6 1.7 - 7.7 K/uL   Lymphocytes Relative 34 %   Lymphs Abs 2.6 0.7 - 4.0 K/uL   Monocytes Relative 6 %   Monocytes Absolute 0.5 0.1 - 1.0 K/uL   Eosinophils Relative 1 %   Eosinophils Absolute 0.1 0.0 - 0.5 K/uL   Basophils Relative 0 %   Basophils Absolute 0.0 0.0 - 0.1 K/uL   Immature Granulocytes 0 %   Abs Immature Granulocytes 0.02 0.00 - 0.07 K/uL  Comprehensive metabolic panel  Result Value Ref Range   Sodium 140 135 - 145 mmol/L   Potassium 3.7 3.5 - 5.1 mmol/L   Chloride 106 98 - 111 mmol/L   CO2 21 (L) 22 - 32 mmol/L   Glucose, Bld 95 70 - 99 mg/dL   BUN 16 6 - 20 mg/dL   Creatinine, Ser 1.29 (H) 0.61 - 1.24 mg/dL   Calcium 9.0 8.9 - 10.3 mg/dL   Total Protein 7.0 6.5 - 8.1 g/dL   Albumin 3.7 3.5 - 5.0 g/dL   AST 28 15 - 41 U/L   ALT 25 0 - 44 U/L   Alkaline Phosphatase 48 38 - 126 U/L   Total Bilirubin 0.9 0.3 - 1.2 mg/dL   GFR calc non Af Amer >60 >60 mL/min   GFR calc Af Amer >60 >60 mL/min   Anion gap 13 5 - 15  Lipase, blood  Result Value Ref Range   Lipase 166 (H) 11 - 51  U/L    EKG None  Radiology No results found.  Procedures Procedures (including critical care time)  Medications Ordered in ED Medications  sodium chloride 0.9 % bolus 1,000 mL (has no administration in time range)     Initial Impression / Assessment and Plan / ED Course  I have reviewed the triage vital signs and the nursing notes.  Pertinent labs & imaging results that were available during my care of the patient were reviewed by me and considered in my medical decision making (see chart for details).    8:42 AM BP (!) 157/101 (BP Location: Right Arm)   Pulse 81   Temp 98.3 F (36.8 C) (Oral)   Resp 18   SpO2 97%  Patient returns to the ED with uncontrolled pain vomiting with a diagnosis of acute pancreatitis. Labs drawn within the last 12 hours, however I have ordered repeat labs. The patient will need admission. I have ordered fluids and pain meds.   9:20 AM Patient admitted by internal medicine teaching service.  Hemodynamically stable and afebrile.  Final Clinical Impressions(s) / ED Diagnoses   Final diagnoses:  None    ED Discharge Orders    None       Margarita Mail, PA-C 04/06/18 [id number]    Gareth Morgan, MD 04/06/18 [phone]

## 2018-04-07 ENCOUNTER — Telehealth: Payer: Self-pay | Admitting: Nurse Practitioner

## 2018-04-07 ENCOUNTER — Encounter (HOSPITAL_COMMUNITY): Payer: Self-pay | Admitting: General Practice

## 2018-04-07 DIAGNOSIS — E860 Dehydration: Secondary | ICD-10-CM | POA: Diagnosis present

## 2018-04-07 DIAGNOSIS — K219 Gastro-esophageal reflux disease without esophagitis: Secondary | ICD-10-CM | POA: Diagnosis present

## 2018-04-07 DIAGNOSIS — K859 Acute pancreatitis without necrosis or infection, unspecified: Secondary | ICD-10-CM | POA: Diagnosis present

## 2018-04-07 DIAGNOSIS — N179 Acute kidney failure, unspecified: Secondary | ICD-10-CM | POA: Diagnosis not present

## 2018-04-07 DIAGNOSIS — Z87442 Personal history of urinary calculi: Secondary | ICD-10-CM | POA: Diagnosis not present

## 2018-04-07 DIAGNOSIS — Z79891 Long term (current) use of opiate analgesic: Secondary | ICD-10-CM | POA: Diagnosis not present

## 2018-04-07 DIAGNOSIS — Z8249 Family history of ischemic heart disease and other diseases of the circulatory system: Secondary | ICD-10-CM | POA: Diagnosis not present

## 2018-04-07 DIAGNOSIS — G8929 Other chronic pain: Secondary | ICD-10-CM | POA: Diagnosis present

## 2018-04-07 DIAGNOSIS — Z79899 Other long term (current) drug therapy: Secondary | ICD-10-CM | POA: Diagnosis not present

## 2018-04-07 DIAGNOSIS — Z833 Family history of diabetes mellitus: Secondary | ICD-10-CM | POA: Diagnosis not present

## 2018-04-07 DIAGNOSIS — M545 Low back pain: Secondary | ICD-10-CM | POA: Diagnosis present

## 2018-04-07 DIAGNOSIS — J42 Unspecified chronic bronchitis: Secondary | ICD-10-CM | POA: Diagnosis present

## 2018-04-07 DIAGNOSIS — Q600 Renal agenesis, unilateral: Secondary | ICD-10-CM | POA: Diagnosis not present

## 2018-04-07 DIAGNOSIS — I10 Essential (primary) hypertension: Secondary | ICD-10-CM | POA: Diagnosis present

## 2018-04-07 DIAGNOSIS — K861 Other chronic pancreatitis: Secondary | ICD-10-CM | POA: Diagnosis present

## 2018-04-07 DIAGNOSIS — Q453 Other congenital malformations of pancreas and pancreatic duct: Secondary | ICD-10-CM | POA: Diagnosis not present

## 2018-04-07 DIAGNOSIS — Z841 Family history of disorders of kidney and ureter: Secondary | ICD-10-CM | POA: Diagnosis not present

## 2018-04-07 LAB — COMPREHENSIVE METABOLIC PANEL
ALT: 24 U/L (ref 0–44)
ANION GAP: 11 (ref 5–15)
AST: 30 U/L (ref 15–41)
Albumin: 3.2 g/dL — ABNORMAL LOW (ref 3.5–5.0)
Alkaline Phosphatase: 55 U/L (ref 38–126)
BUN: 7 mg/dL (ref 6–20)
CO2: 25 mmol/L (ref 22–32)
Calcium: 8.4 mg/dL — ABNORMAL LOW (ref 8.9–10.3)
Chloride: 102 mmol/L (ref 98–111)
Creatinine, Ser: 1.24 mg/dL (ref 0.61–1.24)
Glucose, Bld: 119 mg/dL — ABNORMAL HIGH (ref 70–99)
Potassium: 3.8 mmol/L (ref 3.5–5.1)
Sodium: 138 mmol/L (ref 135–145)
Total Bilirubin: 1 mg/dL (ref 0.3–1.2)
Total Protein: 6.1 g/dL — ABNORMAL LOW (ref 6.5–8.1)

## 2018-04-07 LAB — HIV ANTIBODY (ROUTINE TESTING W REFLEX): HIV Screen 4th Generation wRfx: NONREACTIVE

## 2018-04-07 LAB — CBC
HCT: 37.6 % — ABNORMAL LOW (ref 39.0–52.0)
Hemoglobin: 12.3 g/dL — ABNORMAL LOW (ref 13.0–17.0)
MCH: 31.4 pg (ref 26.0–34.0)
MCHC: 32.7 g/dL (ref 30.0–36.0)
MCV: 95.9 fL (ref 80.0–100.0)
NRBC: 0 % (ref 0.0–0.2)
Platelets: 157 10*3/uL (ref 150–400)
RBC: 3.92 MIL/uL — AB (ref 4.22–5.81)
RDW: 12.6 % (ref 11.5–15.5)
WBC: 6.9 10*3/uL (ref 4.0–10.5)

## 2018-04-07 MED ORDER — POTASSIUM CHLORIDE 2 MEQ/ML IV SOLN
INTRAVENOUS | Status: DC
  Administered 2018-04-07 – 2018-04-08 (×3): via INTRAVENOUS
  Filled 2018-04-07 (×4): qty 1000

## 2018-04-07 NOTE — Telephone Encounter (Signed)
Noted and note sent to [facility].

## 2018-04-07 NOTE — Telephone Encounter (Signed)
Dr. Rebeca Alert from [facility] called to make Kyle Berry aware that pt is having a recurring episode of acute pancreatitis; pt is given fluids and pain control.  LM on VM for pt to sched follow-up appt.

## 2018-04-07 NOTE — Plan of Care (Signed)
  Problem: Clinical Measurements: Goal: Will remain free from infection Outcome: Progressing Goal: Respiratory complications will improve Outcome: Progressing Goal: Cardiovascular complication will be avoided Outcome: Progressing   Problem: Coping: Goal: Level of anxiety will decrease Outcome: Progressing

## 2018-04-07 NOTE — Progress Notes (Addendum)
   Subjective: Kyle Berry reports of intermittent abdominal pain that is worse in the morning. Also complains of loose stools and states this is usually associated with his previous history of pancreatitis. Denies nausea, vomiting but still endorses anorexia. He indicates that he has been told of possible recurrence of pancreatitis due to his pancreatic divisum. He was told to follow up with GI prn and if his symptoms worsens.   Objective:  Vital signs in last 24 hours: Vitals:   04/06/18 1131 04/06/18 1448 04/06/18 2027 04/07/18 0528  BP: (!) 140/96 (!) 139/91 (!) 155/98 (!) 141/96  Pulse: 78 (!) 58 95 75  Resp: (!) 24 18 16 16   Temp: (!) 97.5 F (36.4 C) (!) 97.5 F (36.4 C) 99 F (37.2 C) 98.5 F (36.9 C)  TempSrc: Oral Oral Oral Oral  SpO2: 99% 99% 97% 98%    General: Well nourished, well appearing, NAD  Cardiac: RRR, normal S1, S2, no murmurs, rubs or gallops  Pulmonary: Lungs CTA bilaterally, no wheezing, rhonchi or rales  Abdomen: Soft, TTP epigastric area, positive bowel sounds Extremity: No LE edema, no muscle atrophy, no lesions or wounds noted  Psychiatry: Normal mood and affect     Assessment/Plan:  Active Problems:   Pancreatitis  This is a 47 year old male with history of hypertension, alcohol use, pancreatic divisum, and recurrent pancreatitis status post cholecystectomy in July 2019 presented with abdominal pain, back pain, nausea, and vomiting for 2 days. He was found to have findings of pancreatitis on MRCP.   Pancreatitis:Patient has a history of pancreatic divisum which predisposes to recurrence of pancreatitis.  He does not seem to have any other inciting events and has not had any alcohol use for 2 years.  On admission lipase was 105 and T bili was up to 1.4.  Today his T-bili improved to 1.0.  MRCP showed evidence of pancreatitis.  Lipid panel showed some elevation in his triglycerides to 244.  Otherwise his CBC and BMP were unremarkable. Patient is still  having abdominal pain today he is status post 2 L normal saline and is currently on maintenance D5 LR. -Continue Dilaudid 1 mg every 6 hours as needed for pain -Continue Creon -Advance diet as tolerated -Continue Phenergan 25 mg every 6 hours as needed -Likely can repeat MRCP after symptoms resolve, will contact patient's GI doctor   Hypertension: Patient is currently on telmisartan 40 mg daily.  Blood pressure today was up to 141/96.  Continue telmisartan 40 mg daily.  FEN: D5 LR 125 cc/hr, replete lytes prn, ADAT VTE ppx: Lovenox  Code Status: FULL    Dispo: Anticipated discharge pending improvement  Kyle Noble, MD 04/07/2018, 6:50 AM Pager: [phone]

## 2018-04-07 NOTE — Discharge Summary (Addendum)
Name: Kyle Berry MRN: [id number] DOB: Apr 11, 1971 47 y.o. PCP: Kerin Perna, NP  Date of Admission: 04/06/2018  8:14 AM Date of Discharge: 04/13/2018 Attending Physician: Lalla Brothers MD   Discharge Diagnosis: 1. Acute on Chronic Pancreatitis 2.  Hypertension  Discharge Medications: Allergies as of 04/13/2018      Reactions   Diclofenac Hives   Norvasc [amlodipine Besylate] Other (See Comments)   Fluid buildup in chest   Omeprazole Other (See Comments)   MD stopped due to pancreatitis   Prednisone Other (See Comments)   Mood swings      Medication List    STOP taking these medications   morphine 15 MG tablet Commonly known as:  MSIR     TAKE these medications   albuterol 108 (90 Base) MCG/ACT inhaler Commonly known as:  PROVENTIL HFA;VENTOLIN HFA Inhale 1-2 puffs into the lungs every 4 (four) hours as needed for wheezing or shortness of breath.   DRY EYES OP Apply 1 drop to eye daily as needed (for dry eyes).   famotidine 20 MG tablet Commonly known as:  PEPCID Take 1 tablet (20 mg total) by mouth 2 (two) times daily.   feeding supplement Liqd Take 1 Container by mouth 3 (three) times daily between meals.   lipase/protease/amylase 36000 UNITS Cpep capsule Commonly known as:  CREON Take 1 capsule (36,000 Units total) by mouth See admin instructions. Take one capsules (36,000 units) by mouth with meals (2 meals daily) and one capsule (36000 units) with snacks daily   ondansetron 4 MG disintegrating tablet Commonly known as:  ZOFRAN ODT Take 1 tablet (4 mg total) by mouth every 8 (eight) hours as needed for nausea or vomiting.   promethazine 25 MG tablet Commonly known as:  PHENERGAN Take 1 tablet (25 mg total) by mouth every 6 (six) hours as needed for up to 5 days for nausea or vomiting.   telmisartan 40 MG tablet Commonly known as:  MICARDIS Take 40 mg by mouth daily.   traMADol 50 MG tablet Commonly known as:  ULTRAM Take 1 tablet  (50 mg total) by mouth every 6 (six) hours as needed for up to 5 days for moderate pain.       Disposition and follow-up:   Kyle Berry was discharged from [facility] in Stable condition.  At the hospital follow up visit please address:  1. Pancreatitis: Follow-up MRI in 3 months reassess cystic lesion seen in the pancreatic head visualized on MRCP.  2. Hypertension: Continued home medication on discharge  3.  Labs / imaging needed at time of follow-up: MRI in 3 months  4.  Pending labs/ test needing follow-up: None  Hospital Course by problem list: 1. Pancreatitis: Kyle Berry was admitted for signs and symptoms of  acute on chronic pancreatitis likely related to pancreatic divisum. He was medically managed with IVFs and dilaudid. His home creon was continued. MRCP showed a motion degraded study, with edema and trace fluid around the head of the pancreas and a 2.2 x 0.9 cm cystic focus in the head of the pancreas that could not be assessed due to the motion, and normal tension of hepatic bile ducts. Repeat MRCP 3 days later showed 8 mm cystic mass in head of pancreas that was not suspicious and may be related to the pancreatitis and improvement in the edema in the head of the pancreas. Patient had slow improvement with fluids and advancing diet. He was discharged in stable condition.  He will need a follow-up MRI in 3 months to reassess cystic lesion seen in the pancreatic head.   2.  Hypertension: Patient was on irbesartan 40 mg daily patient, had hypertension during admission however was in acute pain secondary to pancreatitis.  His home antihypertensives were continued throughout his admission. He was discharged on this medication.  Discharge Vitals:   BP (!) 136/93 (BP Location: Right Arm)   Pulse 70   Temp 99.1 F (37.3 C) (Oral)   Resp 18   SpO2 98%   Pertinent Labs, Studies, and Procedures:  Lipase 105  04/06/18 MR abdomen MRCP with and without  contrast: IMPRESSION: 1. Markedly motion degraded study. 2. Edema and trace fluid seen around the head of pancreas. Pancreatic parenchyma largely obscured by motion artifact. 2.2 x 0.9 cm cystic focus in the head of the pancreas could not be definitively assessed give change n the motion. Follow-up MRI after resolution of acute symptoms when patient is best able to participate in positioning and breath holding is suggested. 3. Minimal distention of the extrahepatic bile ducts with tapering in the head of the pancreas, potentially secondary to edema from the inflammation. No definite findings of choledocholithiasis although assessment limited by motion. Attention to the distal common bile duct on follow-up MRI recommended.  04/09/18 MR abdomen MRCP with and without contrast:  IMPRESSION: 1. Interval near complete resolution of edema seen in the region the pancreatic head. Patient has pancreas divisum anatomy and a tiny 8 mm cystic lesion in the posterior pancreatic head/uncinate process. No overtly suspicious features and this may be related to the pancreatitis. Follow-up MRI in 3 months recommended to reassess.   Discharge Instructions:    Kyle Berry,   I am glad that you are feeling better. You were hospitalized for recurrent pancreatitis. We treated you with pain medications, fluids, and nausea medications.  Please make sure to follow-up with your PCP in the next week.    Signed: Carroll Sage, MD 04/14/2018, 1:17 PM   Pager: [phone]

## 2018-04-08 LAB — BASIC METABOLIC PANEL
Anion gap: 8 (ref 5–15)
BUN: 6 mg/dL (ref 6–20)
CO2: 29 mmol/L (ref 22–32)
Calcium: 9 mg/dL (ref 8.9–10.3)
Chloride: 98 mmol/L (ref 98–111)
Creatinine, Ser: 1.18 mg/dL (ref 0.61–1.24)
GFR calc Af Amer: >60 mL/min (ref >60)
GFR calc non Af Amer: >60 mL/min (ref >60)
Glucose, Bld: 116 mg/dL — ABNORMAL HIGH (ref 70–99)
Potassium: 3.9 mmol/L (ref 3.5–5.1)
Sodium: 135 mmol/L (ref 135–145)

## 2018-04-08 LAB — HEPATIC FUNCTION PANEL
ALT: 24 U/L (ref 0–44)
AST: 25 U/L (ref 15–41)
Albumin: 3.4 g/dL — ABNORMAL LOW (ref 3.5–5.0)
Alkaline Phosphatase: 46 U/L (ref 38–126)
Bilirubin, Direct: <0.1 mg/dL (ref 0.0–0.2)
Total Bilirubin: 1 mg/dL (ref 0.3–1.2)
Total Protein: 6.7 g/dL (ref 6.5–8.1)

## 2018-04-08 LAB — CBC
HCT: 40.2 % (ref 39.0–52.0)
Hemoglobin: 13.2 g/dL (ref 13.0–17.0)
MCH: 32 pg (ref 26.0–34.0)
MCHC: 32.8 g/dL (ref 30.0–36.0)
MCV: 97.6 fL (ref 80.0–100.0)
Platelets: 177 10*3/uL (ref 150–400)
RBC: 4.12 MIL/uL — ABNORMAL LOW (ref 4.22–5.81)
RDW: 12.4 % (ref 11.5–15.5)
WBC: 6.4 10*3/uL (ref 4.0–10.5)
nRBC: 0.3 % — ABNORMAL HIGH (ref 0.0–0.2)

## 2018-04-08 MED ORDER — HYDROMORPHONE HCL 1 MG/ML IJ SOLN
1.0000 mg | INTRAMUSCULAR | Status: DC | PRN
  Administered 2018-04-08 – 2018-04-12 (×18): 1 mg via INTRAVENOUS
  Filled 2018-04-08 (×18): qty 1

## 2018-04-08 MED ORDER — POTASSIUM CHLORIDE 2 MEQ/ML IV SOLN
INTRAVENOUS | Status: AC
  Administered 2018-04-08: 12:00:00 via INTRAVENOUS
  Filled 2018-04-08 (×3): qty 1000

## 2018-04-08 MED ORDER — HYDROMORPHONE HCL 1 MG/ML IJ SOLN
1.0000 mg | Freq: Once | INTRAMUSCULAR | Status: AC
  Administered 2018-04-08: 1 mg via INTRAVENOUS
  Filled 2018-04-08: qty 1

## 2018-04-08 NOTE — Progress Notes (Signed)
   Subjective: Patient had no acute events overnight. He reprots that he is still having abdominal pain, nausea, and may have a small emesis yesterday but was able to keep it down. He had tried to eat some pudding yesterday but states that it was causing a lot of abdominal pain so he stopped. He reports that the pain medications helps but only lasts for a few hours and then it wears off. We discussed the plan for today and he is in agreement.   Objective:  Vital signs in last 24 hours: Vitals:   04/07/18 0528 04/07/18 1306 04/07/18 2045 04/08/18 0544  BP: (!) 141/96 (!) 136/93 (!) 155/99 (!) 145/99  Pulse: 75 70 64 64  Resp: 16 18 19 19   Temp: 98.5 F (36.9 C) 99.1 F (37.3 C) 99.1 F (37.3 C) 98.4 F (36.9 C)  TempSrc: Oral Oral Oral Oral  SpO2: 98% 98% 98% 99%    General: Well nourished, well appearing, NAD Cardiac: RRR, normal S1, S2, no murmurs, rubs or gallops  Pulmonary: Lungs CTA bilaterally, no wheezing, rhonchi or rales  Abdomen: Positive bowel sounds, soft, tender to palpation of epigastric area, no guarding Extremity: No LE edema, no muscle atrophy, no lesions or wounds noted  Psychiatry: Normal mood and affect     Assessment/Plan:  Principal Problem:   Acute recurrent pancreatitis Active Problems:   Chronic pancreatitis (HCC)   Essential hypertension   Pancreatitis   Pancreatic divisum  This is a 47 year old male with history of hypertension, alcohol use, pancreatic divisum, and recurrent pancreatitis status post cholecystectomy in July 2019 presented with abdominal pain, back pain, nausea, and vomiting for 2 days. He was found to have findings of pancreatitis on MRCP.  Patient has not been improving possibly like, will increase pain medication and continue maintenance fluids.  Pancreatitis: Patient reports he still having a lot of abdominal pain, he is unable to tolerate p.o. He is still requiring dilaudid for pain control. He remains afebrile with no  leukocytosis.  CBC and BMP have been unremarkable.  No acute abdomen on exam.  This appears to be a persistent bout of pancreatitis, will increase frequency of pain medications.  -Increase Dilaudid 1 mg every 3 hours as needed for pain -Continue Creon -Continue maintenance fluids -Advance diet as tolerated -Continue Phenergan 25 mg every 6 hours as needed -Informed patients GI doctor and he will have follow up with them.   Hypertension: Patient is currently on telmisartan 40 mg daily.  Blood pressure today was up to 141/96.  Continue telmisartan 40 mg daily.  FEN: D5 LR at 100cc/hr, replete lytes prn, ADAT VTE ppx: Lovenox  Code Status: FULL    Dispo: Anticipated discharge is pending clinical improvement  Asencion Noble, MD 04/08/2018, 6:30 AM Pager: [phone]

## 2018-04-09 ENCOUNTER — Inpatient Hospital Stay (HOSPITAL_COMMUNITY): Payer: Managed Care, Other (non HMO)

## 2018-04-09 LAB — COMPREHENSIVE METABOLIC PANEL
ALT: 21 U/L (ref 0–44)
AST: 21 U/L (ref 15–41)
Albumin: 3.2 g/dL — ABNORMAL LOW (ref 3.5–5.0)
Alkaline Phosphatase: 50 U/L (ref 38–126)
Anion gap: 11 (ref 5–15)
BUN: 7 mg/dL (ref 6–20)
CO2: 27 mmol/L (ref 22–32)
Calcium: 9.1 mg/dL (ref 8.9–10.3)
Chloride: 99 mmol/L (ref 98–111)
Creatinine, Ser: 1.25 mg/dL — ABNORMAL HIGH (ref 0.61–1.24)
GFR calc Af Amer: >60 mL/min (ref >60)
GFR calc non Af Amer: >60 mL/min (ref >60)
Glucose, Bld: 113 mg/dL — ABNORMAL HIGH (ref 70–99)
Potassium: 3.8 mmol/L (ref 3.5–5.1)
Sodium: 137 mmol/L (ref 135–145)
Total Bilirubin: 0.6 mg/dL (ref 0.3–1.2)
Total Protein: 6.1 g/dL — ABNORMAL LOW (ref 6.5–8.1)

## 2018-04-09 LAB — MAGNESIUM: Magnesium: 1.8 mg/dL (ref 1.7–2.4)

## 2018-04-09 MED ORDER — MAGNESIUM SULFATE 2 GM/50ML IV SOLN
2.0000 g | Freq: Once | INTRAVENOUS | Status: AC
  Administered 2018-04-09: 2 g via INTRAVENOUS
  Filled 2018-04-09: qty 50

## 2018-04-09 MED ORDER — GADOBUTROL 1 MMOL/ML IV SOLN
7.0000 mL | Freq: Once | INTRAVENOUS | Status: AC | PRN
  Administered 2018-04-09: 7 mL via INTRAVENOUS

## 2018-04-09 MED ORDER — LORAZEPAM 2 MG/ML IJ SOLN
1.5000 mg | Freq: Once | INTRAMUSCULAR | Status: AC | PRN
  Administered 2018-04-09: 1.5 mg via INTRAVENOUS
  Filled 2018-04-09: qty 1

## 2018-04-09 MED ORDER — POTASSIUM CHLORIDE 2 MEQ/ML IV SOLN
INTRAVENOUS | Status: AC
  Administered 2018-04-09 – 2018-04-10 (×3): via INTRAVENOUS
  Filled 2018-04-09 (×3): qty 1000

## 2018-04-09 NOTE — Progress Notes (Addendum)
   Subjective:  Kyle Berry started eating this morning but had an episode of NBNB emesis. Otherwise, he is tolerating diet and heading in the right direction.  He reports that he is still having some abdominal pain, located over the epigastric area but that it has been improving.  Denies any new symptoms.  We discussed the plan for and he reports that he is in agreement.   Objective:  Vital signs in last 24 hours: Vitals:   04/08/18 0544 04/08/18 1355 04/08/18 2136 04/09/18 0453  BP: (!) 145/99 (!) 146/80 (!) 135/92 140/88  Pulse: 64 69 69 60  Resp: 19 16 18 18   Temp: 98.4 F (36.9 C) 98.7 F (37.1 C) 99.1 F (37.3 C) 98.5 F (36.9 C)  TempSrc: Oral Oral Oral Oral  SpO2: 99% 99% 100% 99%    General: Well nourished, well appearing, NAD  Cardiac: RRR, normal S1, S2, no murmurs, rubs or gallops  Pulmonary: Lungs CTA bilaterally, no wheezing, rhonchi or rales  Abdomen: Soft, tenderness to palpation over epigastric areas, + BS Extremity: No LE edema, no muscle atrophy Psychiatry: Normal mood and affect     Assessment/Plan:  Principal Problem:   Acute recurrent pancreatitis Active Problems:   Chronic pancreatitis (HCC)   Essential hypertension   Pancreatitis   Pancreatic divisum  This is a 47 year old male with history of hypertension, alcohol use, pancreatic divisum, and recurrent pancreatitis status post cholecystectomy in July 2019 presented with abdominal pain, back pain, nausea, and vomiting for 2 days.He was found to have findings of pancreatitis on MRCP.  Patient has not been improving possibly like, will increase pain medication and continue maintenance fluids.  Pancreatitis: Patient reports that he has been able to eat a little better, did have an episode of emesis this morning.  His pain medications were increased in frequency yesterday which he reports helped.  Patient is still happy having better improvement. MRCP on 12/22 showed a motion degraded study, with edema  and trace fluid around the head of the pancreas and a 2.2 x 0.9 cm cystic focus in the head of the pancreas that could not be assessed due to motion, normal distention hepatic bile ducts.  Radiology recommended repeat MRI after resolution of acute symptoms.  Contacted MRI tech on call, who reported that he can get it done later today made patient n.p.o. for now until MR CP. CMP was unremarkable.  Patient appears to be doing better symptomatically -Follow-up on MRCP -Continue patient's home Creon -Continue maintenance fluids with the D5 LR 100 mL/h -Continue Phenergan 25 mg every 6 hours as needed -ADAT  Hypertension: Patient is on telmisartan 40 mg daily as blood pressure was elevated today however he still having abdominal pain which is likely contributing to his hypertension.  Will continue telmisartin 40 mg daily.  FEN: D5 LR at 100 cc/hr, replete lytes prn, ADAT VTE ppx: Lovenox  Code Status: FULL   Dispo: Anticipated discharge is pending clinical improvement, possibly in 1 to 2 days  Kyle Noble, MD 04/09/2018, 6:18 AM Pager: [phone]

## 2018-04-10 DIAGNOSIS — N179 Acute kidney failure, unspecified: Secondary | ICD-10-CM

## 2018-04-10 LAB — COMPREHENSIVE METABOLIC PANEL
ALT: 21 U/L (ref 0–44)
AST: 21 U/L (ref 15–41)
Albumin: 3.2 g/dL — ABNORMAL LOW (ref 3.5–5.0)
Alkaline Phosphatase: 46 U/L (ref 38–126)
Anion gap: 9 (ref 5–15)
BUN: 6 mg/dL (ref 6–20)
CO2: 29 mmol/L (ref 22–32)
Calcium: 8.8 mg/dL — ABNORMAL LOW (ref 8.9–10.3)
Chloride: 99 mmol/L (ref 98–111)
Creatinine, Ser: 1.37 mg/dL — ABNORMAL HIGH (ref 0.61–1.24)
GFR calc Af Amer: >60 mL/min (ref >60)
GFR calc non Af Amer: >60 mL/min (ref >60)
Glucose, Bld: 104 mg/dL — ABNORMAL HIGH (ref 70–99)
Potassium: 3.8 mmol/L (ref 3.5–5.1)
Sodium: 137 mmol/L (ref 135–145)
Total Bilirubin: 0.9 mg/dL (ref 0.3–1.2)
Total Protein: 6.5 g/dL (ref 6.5–8.1)

## 2018-04-10 LAB — MAGNESIUM: Magnesium: 2 mg/dL (ref 1.7–2.4)

## 2018-04-10 MED ORDER — LACTATED RINGERS IV BOLUS
1000.0000 mL | Freq: Once | INTRAVENOUS | Status: AC
  Administered 2018-04-10: 1000 mL via INTRAVENOUS

## 2018-04-10 NOTE — Progress Notes (Signed)
Subjective: Patient was doing well today, no acute events overnight. He reports that he had a cup of coffee this morning, that is all he had since breakfast yesterday, he was npo due to the MRCP. He still having intermittent abdominal pain with some radiation to his back, he does feel like it's better in the morning however will get worse later in the day.  He denies any new symptoms such as fevers, chills, chest pain or shortness of breath.  Objective:  Vital signs in last 24 hours: Vitals:   04/09/18 0453 04/09/18 1427 04/09/18 2111 04/10/18 0504  BP: 140/88 (!) 145/94 (!) 143/102 (!) 112/94  Pulse: 60 62 89 73  Resp: 18 20 19 18   Temp: 98.5 F (36.9 C) 98.6 F (37 C) 97.8 F (36.6 C) 98.7 F (37.1 C)  TempSrc: Oral Oral Oral Oral  SpO2: 99% 98% 93% 96%    General: Well nourished, well appearing, NAD  Cardiac: RRR, normal S1, S2, no murmurs, rubs or gallops  Pulmonary: Lungs CTA bilaterally, no wheezing, rhonchi or rales  Abdomen: Soft, tenderness over epigastric area, positive bowel sounds Psychiatry: Normal mood and affect     Assessment/Plan:  Principal Problem:   Acute recurrent pancreatitis Active Problems:   Chronic pancreatitis (HCC)   Essential hypertension   Pancreatitis   Pancreatic divisum  This is a 47 year old male with history of hypertension, alcohol use, pancreatic divisum, and recurrent pancreatitis status post cholecystectomy in July 2019 presented with abdominal pain, back pain, nausea, and vomiting for 2 days.He was found to have findings of pancreatitis on MRCP.  Patient has been improving slowly however is still requiring frequent pain medications.   Pancreatitis: Patient reports that he is feeling okay today, still having intermittent abdominal pain that improves with pain medications.  Still is feeling nauseous however no episodes of emesis today.  He is still requiring a lot of pain medications and has not been tolerating his diet well.  Vitals  have all remained stable. CMP showed a small increase in Cr, other labs were WNL. We obtained a MRCP yesterday to evaluate the cystic focus in the head of the pancreas. MRCP showed near complete resolution of edema in the region of the pancreatic head, pancreas divisum anatomy and a tiny 8 mm cystic lesion in the posterior pancreatic head/indicate process with no suspicious features.  Radiology recommended MRI in 3 months.  Improving slowly then we would like however was able to tolerate a small diet yesterday and he was able to drink coffee this morning, we will see how his diet advances today.  -Continue dilaudid 1 mg q3 hours -Continue Creon twice daily -Continue Phenergan 25 mg every 6 hours as needed -ADAT -Increase maintenance fluids at D5 LR 125 cc/hr   AKI: He has ha a slight increase in his Cr today to 1.37, he has had some episodes of emesis yesterday which may be contributing to some dehydration.  -Continue maintenance fluids D5 LR at 100 cc/h -1 L LR bolus -Repeat BMP in AM  Hypertension: Patient was normotensive this morning even though he had been hypertensive last night, patient has been having poor oral intake and though he remains on maintenance fluid he may have some dehydration. He is on telmisartan 40 mg at home, he is on irbesartan 40 mg daily here. -Continue Irbesartan 40 mg daily  Hypomagnesemia: Resolved, Mg today was 2.0.  We will continue to monitor.  FEN: D5 LR 125 cc/hr, replete lytes prn, ADAT VTE ppx:  Lovenox  Code Status: FULL   Dispo: Anticipated discharge is pending clinical improvement  Asencion Noble, MD 04/10/2018, 6:11 AM Pager: [phone]

## 2018-04-11 ENCOUNTER — Encounter (HOSPITAL_COMMUNITY): Payer: Self-pay | Admitting: Student in an Organized Health Care Education/Training Program

## 2018-04-11 LAB — BASIC METABOLIC PANEL
Anion gap: 12 (ref 5–15)
BUN: 11 mg/dL (ref 6–20)
CO2: 22 mmol/L (ref 22–32)
Calcium: 9.1 mg/dL (ref 8.9–10.3)
Chloride: 103 mmol/L (ref 98–111)
Creatinine, Ser: 1.31 mg/dL — ABNORMAL HIGH (ref 0.61–1.24)
GFR calc Af Amer: >60 mL/min (ref >60)
GFR calc non Af Amer: >60 mL/min (ref >60)
Glucose, Bld: 112 mg/dL — ABNORMAL HIGH (ref 70–99)
Potassium: 4.4 mmol/L (ref 3.5–5.1)
Sodium: 137 mmol/L (ref 135–145)

## 2018-04-11 LAB — MAGNESIUM: Magnesium: 1.8 mg/dL (ref 1.7–2.4)

## 2018-04-11 MED ORDER — SODIUM CHLORIDE 0.9 % IV BOLUS: 1000.0000 mL | Freq: Once | INTRAVENOUS | Status: DC

## 2018-04-11 MED ORDER — SODIUM CHLORIDE 0.9 % IV BOLUS
1000.0000 mL | Freq: Once | INTRAVENOUS | Status: AC
  Administered 2018-04-11: 1000 mL via INTRAVENOUS

## 2018-04-11 MED ORDER — LACTATED RINGERS IV SOLN
INTRAVENOUS | Status: AC
  Administered 2018-04-11 (×2): via INTRAVENOUS

## 2018-04-11 MED ORDER — LACTATED RINGERS IV SOLN: INTRAVENOUS | Status: DC

## 2018-04-11 MED ORDER — MAGNESIUM SULFATE 2 GM/50ML IV SOLN
2.0000 g | Freq: Once | INTRAVENOUS | Status: AC
  Administered 2018-04-11: 2 g via INTRAVENOUS
  Filled 2018-04-11: qty 50

## 2018-04-11 NOTE — Progress Notes (Signed)
   Subjective: Kyle Berry reports of one episode of emesis this morning with associated abdominal pain that was worsened by the emesis. Indicates that overall he is improving and had a bowel movement yesterday. His pain is well under control with dilaudid. He is tolerating diet, ambulating and consistent with incentive spirometer.   Objective:  Vital signs in last 24 hours: Vitals:   04/10/18 0504 04/10/18 1500 04/10/18 2120 04/11/18 0445  BP: (!) 112/94 (!) 148/99 (!) 150/93 (!) 136/97  Pulse: 73 77 72 83  Resp: 18   17  Temp: 98.7 F (37.1 C) 98.8 F (37.1 C) 97.7 F (36.5 C)   TempSrc: Oral Oral Oral   SpO2: 96% 98% 98% 95%  Weight:  76.9 kg    Height:  6\' 1"  (1.854 m)      General: Well nourished, well appearing, NAD  Cardiac: RRR, normal S1, S2, no murmurs, rubs or gallops  Pulmonary: Lungs CTA bilaterally, no wheezing, rhonchi or rales  Abdomen: Soft, tenderness to palpation in epigastric area, minimal guarding   Assessment/Plan:  Principal Problem:   Acute recurrent pancreatitis Active Problems:   Chronic pancreatitis (HCC)   Essential hypertension   Pancreatitis   Pancreatic divisum  This is a 47 year old male with history of hypertension, alcohol use, pancreatic divisum, and recurrent pancreatitis status post cholecystectomy in July 2019 presented with abdominal pain, back pain, nausea, and vomiting for 2 days.He was found to have findings of pancreatitis on MRCP. Patient has been improving slowly. Today he reports that he had an episode of emesis this morning but his abdominal pain has been improving.   Pancreatitis: Patient reports that he abdominal pain and had an episode of emesis this morning. His MRCP showed improvement of his pancreatitis with a 12mm cystic lesion in the posterior pancreatic head with no suspicious features and improvement in the edema.  Patient is still requiring some Dilaudid for pain control, received 4 doses yesterday and 1 this morning.   He has been having a slow advancement in his diet. On exam he still has some guarding and epigastric tenderness. Will likely need another day for pain control and advancing his diet. -Continue Dilaudid 1 mg every 3 hours as needed -Continue Phenergan 25 mg every 6 as needed -Advancing diet as tolerated -Continue Creon twice daily  -Discontinue IV fluids today, he has been able to tolerate liquids  AKI: His creatinine yesterday was 1.37.  Patient received 1 L LR bolus yesterday.  Today his creatinine is 1.31. Will repeat BMP in AM.   Hypertension: Patient's blood pressure has been relatively under control, he is on irbesartan 150 mg daily.  He was normotensive this morning.  We will continue irbesartan 150 mg daily.   Hypomagnesemia: Magnesium today was 1.8, will replace.  Repeat magnesium.  FEN: No fluids, replete lytes prn, ADA T  VTE ppx: Lovenox  Code Status: FULL    Dispo: Anticipated discharge in approximately tomorrow.   Asencion Noble, MD 04/11/2018, 6:34 AM Pager: [phone]

## 2018-04-12 LAB — COMPREHENSIVE METABOLIC PANEL
ALBUMIN: 3.3 g/dL — AB (ref 3.5–5.0)
ALT: 21 U/L (ref 0–44)
ANION GAP: 9 (ref 5–15)
AST: 21 U/L (ref 15–41)
Alkaline Phosphatase: 52 U/L (ref 38–126)
BUN: 10 mg/dL (ref 6–20)
CO2: 27 mmol/L (ref 22–32)
Calcium: 8.9 mg/dL (ref 8.9–10.3)
Chloride: 103 mmol/L (ref 98–111)
Creatinine, Ser: 1.25 mg/dL — ABNORMAL HIGH (ref 0.61–1.24)
GFR calc Af Amer: >60 mL/min (ref >60)
GFR calc non Af Amer: >60 mL/min (ref >60)
Glucose, Bld: 117 mg/dL — ABNORMAL HIGH (ref 70–99)
Potassium: 4.2 mmol/L (ref 3.5–5.1)
SODIUM: 139 mmol/L (ref 135–145)
Total Bilirubin: 0.2 mg/dL — ABNORMAL LOW (ref 0.3–1.2)
Total Protein: 6.2 g/dL — ABNORMAL LOW (ref 6.5–8.1)

## 2018-04-12 LAB — MAGNESIUM: Magnesium: 1.9 mg/dL (ref 1.7–2.4)

## 2018-04-12 MED ORDER — OXYCODONE HCL 5 MG PO TABS: 5.0000 mg | ORAL_TABLET | ORAL | Status: DC | PRN

## 2018-04-12 MED ORDER — HYDROMORPHONE HCL 1 MG/ML IJ SOLN: 1.0000 mg | INTRAMUSCULAR | Status: DC | PRN

## 2018-04-12 MED ORDER — TRAMADOL HCL 50 MG PO TABS
50.0000 mg | ORAL_TABLET | Freq: Four times a day (QID) | ORAL | Status: DC | PRN
  Administered 2018-04-13 (×2): 50 mg via ORAL
  Filled 2018-04-12 (×2): qty 1

## 2018-04-12 MED ORDER — OXYCODONE HCL 5 MG PO TABS
5.0000 mg | ORAL_TABLET | Freq: Four times a day (QID) | ORAL | Status: DC | PRN
  Administered 2018-04-12: 10 mg via ORAL
  Filled 2018-04-12: qty 2

## 2018-04-12 NOTE — Plan of Care (Signed)
  Problem: Education: Goal: Knowledge of Pancreatitis treatment and prevention will improve Outcome: Progressing   Problem: Health Behavior/Discharge Planning: Goal: Ability to formulate a plan to maintain an alcohol-free life will improve Outcome: Progressing   Problem: Nutritional: Goal: Ability to achieve adequate nutritional intake will improve Outcome: Progressing   Problem: Clinical Measurements: Goal: Complications related to the disease process, condition or treatment will be avoided or minimized Outcome: Progressing   Problem: Nutrition: Goal: Adequate nutrition will be maintained Outcome: Progressing

## 2018-04-12 NOTE — Progress Notes (Addendum)
   Subjective: Mr. Monforte reports that his abdominal pain has improved somewhat and is now mainly located on his right flank.  He states that it comes and goes and the IV Dilaudid is helping.  He is still requiring IV Dilaudid every 5-6 hours.  He ate his entire dinner last night.  He has however remained nauseous throughout this morning.  He does believe the antinausea medications he is receiving is helping.  He had one normal bowel movement last night.  Objective:  Vital signs in last 24 hours: Vitals:   04/11/18 0445 04/11/18 1501 04/11/18 2252 04/12/18 0541  BP: (!) 136/97 (!) 154/94 (!) 141/91 133/87  Pulse: 83 70 74 60  Resp: 17 17 17 17   Temp:  97.8 F (36.6 C) 98.7 F (37.1 C) 98.5 F (36.9 C)  TempSrc:  Oral Oral Oral  SpO2: 95% 99% 98% 99%  Weight:      Height:       Physical Exam Vitals signs and nursing note reviewed.  Constitutional:      Appearance: He is well-developed.     Comments: Lying in bed in no acute distress.  Abdominal:     Comments: Normal bowel sounds.  Tenderness to palpation of the epigastric region and right upper quadrant.  Neurological:     Mental Status: He is alert.     Assessment/Plan:  Principal Problem:   Acute recurrent pancreatitis Active Problems:   Pancreatic divisum  Mr. Jokerst is a 47 year old male admitted with cute on chronic pancreatitis likely related to pancreatic divisum.  He has been treated with IV fluids and pain control.  He is tolerating a soft diet and ate all of his food last night.  He does continue to have nausea and vomited once this morning.  His abdominal pain is improving while on IV Dilaudid.  We will continue supportive care including oral nutrition and pain medications.  Acute on chronic pancreatitis: - Started oxycodone 5-10 mg q6h PRN for moderate to severe. - Changed Dilaudid 1 mg every 4 hours as needed for breakthrough pain. - Continue Phenergan 25 mg every 6 hours as needed. - Continue advancing diet  as tolerated. - Continue Creon twice daily  Acute kidney injury: Creatinine continues to improve.  Today 1.25 (baseline between 1.1-1.2).  - Continue to monitor  Hypertension: Currently on home irbesartan 150 mg daily.  His blood pressure remains above goal but within acceptable limits (systolic between 616-073). - Continue irbesartan 150 mg daily.  FEN: On soft diet, advance as tolerated VTE prophylaxis: Lovenox CODE STATUS: Full  Dispo: Anticipated discharge in approximately 3 to 4 days.  Carroll Sage, MD 04/12/2018, 7:04 AM Pager: [phone]

## 2018-04-13 LAB — BASIC METABOLIC PANEL
Anion gap: 10 (ref 5–15)
BUN: 10 mg/dL (ref 6–20)
CO2: 27 mmol/L (ref 22–32)
Calcium: 9.3 mg/dL (ref 8.9–10.3)
Chloride: 101 mmol/L (ref 98–111)
Creatinine, Ser: 1.44 mg/dL — ABNORMAL HIGH (ref 0.61–1.24)
GFR calc Af Amer: >60 mL/min (ref >60)
GFR calc non Af Amer: 57 mL/min — ABNORMAL LOW (ref >60)
Glucose, Bld: 98 mg/dL (ref 70–99)
Potassium: 4.2 mmol/L (ref 3.5–5.1)
Sodium: 138 mmol/L (ref 135–145)

## 2018-04-13 MED ORDER — TRAMADOL HCL 50 MG PO TABS: 50.0000 mg | ORAL_TABLET | Freq: Four times a day (QID) | ORAL | 0 refills | Status: AC | PRN

## 2018-04-13 MED ORDER — PROMETHAZINE HCL 25 MG PO TABS: 25.0000 mg | ORAL_TABLET | Freq: Four times a day (QID) | ORAL | 0 refills | Status: DC | PRN

## 2018-04-13 NOTE — Progress Notes (Signed)
Pt discharged home with significant other, left ambulatory to front of hospital

## 2018-04-13 NOTE — Progress Notes (Signed)
   Subjective: Mr. Schwertner was able to eat his breakfast which included pancakes this morning.  He was nauseous before he started to eat and vomited last night.  He does state that feels much better today is ready to go home.  Objective:  Vital signs in last 24 hours: Vitals:   04/12/18 0541 04/12/18 1709 04/12/18 2141 04/13/18 0525  BP: 133/87 (!) 142/97 (!) 140/91 (!) 133/94  Pulse: 60 73 69 61  Resp: 17 17 18 20   Temp: 98.5 F (36.9 C) 99.1 F (37.3 C) 98.7 F (37.1 C) 98.5 F (36.9 C)  TempSrc: Oral Oral Oral Oral  SpO2: 99% 100% 98% 98%  Weight:      Height:       Physical Exam Vitals signs and nursing note reviewed.  Constitutional:      Appearance: He is well-developed.  Abdominal:     General: Bowel sounds are normal.     Tenderness: There is no abdominal tenderness.  Neurological:     Mental Status: He is alert.  Psychiatric:        Mood and Affect: Mood normal.        Behavior: Behavior normal.     Assessment/Plan:  Principal Problem:   Acute recurrent pancreatitis Active Problems:   Pancreatic divisum  Mr. Hartje is a 47 year old male admitted with acute on chronic pancreatitis likely related to pancreatic divisum.  He has been treated with IV fluids/pain control and has improved substantially.  He is tolerating a regular diet and has had minimal nausea.  He is medically stable for discharge today.  Acute on chronic pancreatitis:  - Prescribed tramadol PRN pain - Prescribed phenergan PRN nausea - Recommend continuing his home Creon at discharge. - Recommended that he make an appointment with his PCP this week for a hospital follow-up. He expressed understanding and agreement.   Acute kidney injury: Creatinine has remained stable since admission, 1.44 today. He will need a repeat BMP at hospital follow-up appointment.  Hypertension: His blood pressure remained within acceptable limits while on home irbesartan 150 mg daily.  - Continue irbesartan 150  mg daily at discharge.  Dispo: Anticipated discharge today.  Carroll Sage, MD 04/13/2018, 11:11 AM Pager: [phone]

## 2018-05-29 ENCOUNTER — Other Ambulatory Visit (HOSPITAL_COMMUNITY): Payer: Self-pay | Admitting: Family Medicine

## 2018-05-29 DIAGNOSIS — N200 Calculus of kidney: Secondary | ICD-10-CM

## 2018-06-02 ENCOUNTER — Ambulatory Visit (HOSPITAL_COMMUNITY)
Admission: RE | Admit: 2018-06-02 | Discharge: 2018-06-02 | Disposition: A | Discharge status: Home / Self Care | Payer: Managed Care, Other (non HMO) | Source: Ambulatory Visit | Attending: Family Medicine | Admitting: Family Medicine

## 2018-06-02 DIAGNOSIS — N200 Calculus of kidney: Secondary | ICD-10-CM | POA: Diagnosis present

## 2018-07-23 ENCOUNTER — Other Ambulatory Visit: Payer: Self-pay

## 2018-07-23 ENCOUNTER — Encounter (HOSPITAL_COMMUNITY): Payer: Self-pay | Admitting: Emergency Medicine

## 2018-07-23 ENCOUNTER — Emergency Department (HOSPITAL_COMMUNITY): Payer: Managed Care, Other (non HMO)

## 2018-07-23 ENCOUNTER — Inpatient Hospital Stay (HOSPITAL_COMMUNITY)
Admission: EM | Admit: 2018-07-23 | Discharge: 2018-07-27 | DRG: 439 | Disposition: A | Discharge status: Home / Self Care | Payer: Managed Care, Other (non HMO) | Attending: Internal Medicine | Admitting: Internal Medicine

## 2018-07-23 DIAGNOSIS — K8500 Idiopathic acute pancreatitis without necrosis or infection: Secondary | ICD-10-CM

## 2018-07-23 DIAGNOSIS — Q600 Renal agenesis, unilateral: Secondary | ICD-10-CM

## 2018-07-23 DIAGNOSIS — Z886 Allergy status to analgesic agent status: Secondary | ICD-10-CM

## 2018-07-23 DIAGNOSIS — G43909 Migraine, unspecified, not intractable, without status migrainosus: Secondary | ICD-10-CM | POA: Diagnosis present

## 2018-07-23 DIAGNOSIS — K861 Other chronic pancreatitis: Secondary | ICD-10-CM | POA: Diagnosis present

## 2018-07-23 DIAGNOSIS — K859 Acute pancreatitis without necrosis or infection, unspecified: Secondary | ICD-10-CM | POA: Diagnosis not present

## 2018-07-23 DIAGNOSIS — Z87442 Personal history of urinary calculi: Secondary | ICD-10-CM

## 2018-07-23 DIAGNOSIS — I10 Essential (primary) hypertension: Secondary | ICD-10-CM | POA: Diagnosis present

## 2018-07-23 DIAGNOSIS — Z888 Allergy status to other drugs, medicaments and biological substances status: Secondary | ICD-10-CM

## 2018-07-23 DIAGNOSIS — Z841 Family history of disorders of kidney and ureter: Secondary | ICD-10-CM

## 2018-07-23 DIAGNOSIS — N182 Chronic kidney disease, stage 2 (mild): Secondary | ICD-10-CM | POA: Diagnosis present

## 2018-07-23 DIAGNOSIS — Z8249 Family history of ischemic heart disease and other diseases of the circulatory system: Secondary | ICD-10-CM

## 2018-07-23 DIAGNOSIS — I129 Hypertensive chronic kidney disease with stage 1 through stage 4 chronic kidney disease, or unspecified chronic kidney disease: Secondary | ICD-10-CM | POA: Diagnosis present

## 2018-07-23 DIAGNOSIS — R1013 Epigastric pain: Secondary | ICD-10-CM | POA: Diagnosis not present

## 2018-07-23 DIAGNOSIS — K219 Gastro-esophageal reflux disease without esophagitis: Secondary | ICD-10-CM | POA: Diagnosis present

## 2018-07-23 DIAGNOSIS — Q453 Other congenital malformations of pancreas and pancreatic duct: Secondary | ICD-10-CM

## 2018-07-23 DIAGNOSIS — Z9049 Acquired absence of other specified parts of digestive tract: Secondary | ICD-10-CM

## 2018-07-23 DIAGNOSIS — E785 Hyperlipidemia, unspecified: Secondary | ICD-10-CM | POA: Diagnosis present

## 2018-07-23 DIAGNOSIS — Z79899 Other long term (current) drug therapy: Secondary | ICD-10-CM

## 2018-07-23 LAB — COMPREHENSIVE METABOLIC PANEL
ALT: 26 U/L (ref 0–44)
AST: 32 U/L (ref 15–41)
Albumin: 3.9 g/dL (ref 3.5–5.0)
Alkaline Phosphatase: 66 U/L (ref 38–126)
Anion gap: 10 (ref 5–15)
BUN: 9 mg/dL (ref 6–20)
CO2: 19 mmol/L — ABNORMAL LOW (ref 22–32)
Calcium: 9.2 mg/dL (ref 8.9–10.3)
Chloride: 105 mmol/L (ref 98–111)
Creatinine, Ser: 1.27 mg/dL — ABNORMAL HIGH (ref 0.61–1.24)
GFR calc Af Amer: >60 mL/min (ref >60)
GFR calc non Af Amer: >60 mL/min (ref >60)
Glucose, Bld: 118 mg/dL — ABNORMAL HIGH (ref 70–99)
Potassium: 3.6 mmol/L (ref 3.5–5.1)
Sodium: 134 mmol/L — ABNORMAL LOW (ref 135–145)
Total Bilirubin: 0.5 mg/dL (ref 0.3–1.2)
Total Protein: 7 g/dL (ref 6.5–8.1)

## 2018-07-23 LAB — CBC
HCT: 39.6 % (ref 39.0–52.0)
Hemoglobin: 13.4 g/dL (ref 13.0–17.0)
MCH: 31.9 pg (ref 26.0–34.0)
MCHC: 33.8 g/dL (ref 30.0–36.0)
MCV: 94.3 fL (ref 80.0–100.0)
Platelets: 196 10*3/uL (ref 150–400)
RBC: 4.2 MIL/uL — ABNORMAL LOW (ref 4.22–5.81)
RDW: 13.7 % (ref 11.5–15.5)
WBC: 11.5 10*3/uL — ABNORMAL HIGH (ref 4.0–10.5)
nRBC: 0 % (ref 0.0–0.2)

## 2018-07-23 LAB — LIPASE, BLOOD: Lipase: 136 U/L — ABNORMAL HIGH (ref 11–51)

## 2018-07-23 MED ORDER — HYDROMORPHONE HCL 1 MG/ML IJ SOLN
1.0000 mg | Freq: Once | INTRAMUSCULAR | Status: AC
  Administered 2018-07-23: 1 mg via INTRAVENOUS
  Filled 2018-07-23: qty 1

## 2018-07-23 MED ORDER — ENOXAPARIN SODIUM 40 MG/0.4ML ~~LOC~~ SOLN
40.0000 mg | SUBCUTANEOUS | Status: DC
  Administered 2018-07-23 – 2018-07-27 (×5): 40 mg via SUBCUTANEOUS
  Filled 2018-07-23 (×5): qty 0.4

## 2018-07-23 MED ORDER — SODIUM CHLORIDE 0.9% FLUSH: 3.0000 mL | Freq: Once | INTRAVENOUS | Status: DC

## 2018-07-23 MED ORDER — ACETAMINOPHEN 325 MG PO TABS: 650.0000 mg | ORAL_TABLET | Freq: Four times a day (QID) | ORAL | Status: DC | PRN

## 2018-07-23 MED ORDER — HYDROMORPHONE HCL 1 MG/ML IJ SOLN
1.0000 mg | INTRAMUSCULAR | Status: DC | PRN
  Administered 2018-07-23 – 2018-07-24 (×9): 1 mg via INTRAVENOUS
  Filled 2018-07-23 (×9): qty 1

## 2018-07-23 MED ORDER — SODIUM CHLORIDE 0.9 % IV SOLN
INTRAVENOUS | Status: AC
  Administered 2018-07-23 (×3): via INTRAVENOUS

## 2018-07-23 MED ORDER — ACETAMINOPHEN 650 MG RE SUPP: 650.0000 mg | Freq: Four times a day (QID) | RECTAL | Status: DC | PRN

## 2018-07-23 MED ORDER — ONDANSETRON HCL 4 MG/2ML IJ SOLN
4.0000 mg | Freq: Four times a day (QID) | INTRAMUSCULAR | Status: DC | PRN
  Administered 2018-07-23: 07:00:00 4 mg via INTRAVENOUS
  Filled 2018-07-23: qty 2

## 2018-07-23 MED ORDER — PROMETHAZINE HCL 25 MG/ML IJ SOLN
25.0000 mg | Freq: Four times a day (QID) | INTRAMUSCULAR | Status: DC | PRN
  Administered 2018-07-23 – 2018-07-27 (×11): 25 mg via INTRAVENOUS
  Filled 2018-07-23 (×11): qty 1

## 2018-07-23 MED ORDER — HYDROMORPHONE HCL 1 MG/ML IJ SOLN
1.0000 mg | Freq: Once | INTRAMUSCULAR | Status: AC
  Administered 2018-07-23: 05:00:00 1 mg via INTRAVENOUS
  Filled 2018-07-23: qty 1

## 2018-07-23 MED ORDER — ONDANSETRON HCL 4 MG/2ML IJ SOLN
4.0000 mg | Freq: Once | INTRAMUSCULAR | Status: AC
  Administered 2018-07-23: 4 mg via INTRAVENOUS
  Filled 2018-07-23: qty 2

## 2018-07-23 MED ORDER — SODIUM CHLORIDE 0.9 % IV BOLUS
1000.0000 mL | Freq: Once | INTRAVENOUS | Status: AC
  Administered 2018-07-23: 1000 mL via INTRAVENOUS

## 2018-07-23 MED ORDER — HYDRALAZINE HCL 20 MG/ML IJ SOLN
5.0000 mg | INTRAMUSCULAR | Status: DC | PRN
  Administered 2018-07-23: 5 mg via INTRAVENOUS
  Filled 2018-07-23: qty 1

## 2018-07-23 MED ORDER — IOHEXOL 300 MG/ML  SOLN
100.0000 mL | Freq: Once | INTRAMUSCULAR | Status: AC | PRN
  Administered 2018-07-23: 100 mL via INTRAVENOUS

## 2018-07-23 NOTE — H&P (Signed)
History and Physical    Kyle Berry YKD:[id number] DOB: 1971-01-07 DOA: 07/23/2018  PCP: Kerin Perna, NP Patient coming from: Home  Chief Complaint: Abdominal pain, emesis  HPI: Kyle Berry is a 48 y.o. male with medical history significant of recurrent pancreatitis of unclear etiology, GERD, hypertension, hyperlipidemia presenting to the hospital for evaluation of abdominal pain and emesis.  Patient reports acute onset 10 out of 10 intensity epigastric abdominal pain radiating to his back which started around 1 AM this morning.  He has had a few episodes of emesis since then.  States he has had pancreatitis several times before and this pain seems similar.  Denies any fevers or chills.  States he has not consumed any alcoholic beverages for several months.  Review of Systems: As per HPI otherwise 10 point review of systems negative.  Past Medical History:  Diagnosis Date   Acute pancreatitis 11/23/2015   Chronic bronchitis (HCC)    Chronic lower back pain    Congenital single kidney    GERD (gastroesophageal reflux disease)    Headache    "once/month" (05/13/2017)   High cholesterol    History of kidney stones    Hypertension    Migraine    "a couple/year" (05/13/2017)   Nephrolithiasis 05/13/2017   Pneumonia ~ 09/2015   Recurrent acute pancreatitis    Renal disorder     Past Surgical History:  Procedure Laterality Date   CHOLECYSTECTOMY N/A 10/23/2017   Procedure: LAPAROSCOPIC CHOLECYSTECTOMY WITH INTRAOPERATIVE CHOLANGIOGRAM;  Surgeon: Johnathan Hausen, MD;  Location: [facility];  Service: General;  Laterality: N/A;   NO PAST SURGERIES       reports that he has never smoked. He has never used smokeless tobacco. He reports previous alcohol use. He reports that he does not use drugs.  Allergies  Allergen Reactions   Diclofenac Hives   Norvasc [Amlodipine Besylate] Other (See Comments)    Fluid buildup in chest   Omeprazole Other (See  Comments)    MD stopped due to pancreatitis   Prednisone Other (See Comments)    Mood swings    Family History  Problem Relation Age of Onset   Hypertension Other    Hypertension Mother    Hypertension Father    Kidney disease Father    Hypertension Sister    Diabetes Sister    Hypertension Brother     Prior to Admission medications   Medication Sig Start Date End Date Taking? Authorizing Provider  lipase/protease/amylase (CREON) 36000 UNITS CPEP capsule Take 1 capsule (36,000 Units total) by mouth See admin instructions. Take one capsules (36,000 units) by mouth with meals (2 meals daily) and one capsule (36000 units) with snacks daily 10/14/17  Yes Emokpae, [name], MD  telmisartan (MICARDIS) 40 MG tablet Take 40 mg by mouth daily. 01/02/18  Yes [provider]  famotidine (PEPCID) 20 MG tablet Take 1 tablet (20 mg total) by mouth 2 (two) times daily. Patient not taking: Reported on 01/20/2018 10/14/17   Roxan Hockey, MD  feeding supplement (BOOST / RESOURCE BREEZE) LIQD Take 1 Container by mouth 3 (three) times daily between meals. Patient not taking: Reported on 01/20/2018 01/20/17   Cristal Ford, DO  ondansetron (ZOFRAN ODT) 4 MG disintegrating tablet Take 1 tablet (4 mg total) by mouth every 8 (eight) hours as needed for nausea or vomiting. Patient not taking: Reported on 07/23/2018 04/06/18   Deno Etienne, DO  promethazine (PHENERGAN) 25 MG tablet Take 1 tablet (25 mg total) by mouth  every 6 (six) hours as needed for up to 5 days for nausea or vomiting. Patient not taking: Reported on 07/23/2018 04/13/18 07/23/27  Carroll Sage, MD    Physical Exam: Vitals:   07/23/18 0252 07/23/18 0253 07/23/18 0400 07/23/18 0500  BP: (!) 147/99  (!) 150/99 140/90  Pulse: 100  94 96  Resp: 16     Temp: 98.3 F (36.8 C)     TempSrc: Oral     SpO2: 95%  97% 95%  Weight:  81.2 kg    Height:  6\' 1"  (1.854 m)      Physical Exam  Constitutional: He is oriented to person,  place, and time. He appears well-developed and well-nourished. No distress.  HENT:  Head: Normocephalic.  Mouth/Throat: Oropharynx is clear and moist.  Eyes: Right eye exhibits no discharge. Left eye exhibits no discharge.  Neck: Neck supple.  Cardiovascular: Regular rhythm and intact distal pulses.  Slightly tachycardic  Pulmonary/Chest: Effort normal and breath sounds normal. No respiratory distress. He has no wheezes. He has no rales.  Abdominal: Soft. Bowel sounds are normal. He exhibits no distension. There is abdominal tenderness. There is guarding. There is no rebound.  Epigastrium tender to palpation with guarding  Musculoskeletal:        General: No edema.  Neurological: He is alert and oriented to person, place, and time.  Skin: Skin is warm and dry. He is not diaphoretic.     Labs on Admission: I have personally reviewed following labs and imaging studies  CBC: Recent Labs  Lab 07/23/18 0259  WBC 11.5*  HGB 13.4  HCT 39.6  MCV 94.3  PLT 626   Basic Metabolic Panel: Recent Labs  Lab 07/23/18 0259  NA 134*  K 3.6  CL 105  CO2 19*  GLUCOSE 118*  BUN 9  CREATININE 1.27*  CALCIUM 9.2   GFR: Estimated Creatinine Clearance: 80.4 mL/min (A) (by C-G formula based on SCr of 1.27 mg/dL (H)). Liver Function Tests: Recent Labs  Lab 07/23/18 0259  AST 32  ALT 26  ALKPHOS 66  BILITOT 0.5  PROT 7.0  ALBUMIN 3.9   Recent Labs  Lab 07/23/18 0259  LIPASE 136*   No results for input(s): AMMONIA in the last 168 hours. Coagulation Profile: No results for input(s): INR, PROTIME in the last 168 hours. Cardiac Enzymes: No results for input(s): CKTOTAL, CKMB, CKMBINDEX, TROPONINI in the last 168 hours. BNP (last 3 results) No results for input(s): PROBNP in the last 8760 hours. HbA1C: No results for input(s): HGBA1C in the last 72 hours. CBG: No results for input(s): GLUCAP in the last 168 hours. Lipid Profile: No results for input(s): CHOL, HDL, LDLCALC,  TRIG, CHOLHDL, LDLDIRECT in the last 72 hours. Thyroid Function Tests: No results for input(s): TSH, T4TOTAL, FREET4, T3FREE, THYROIDAB in the last 72 hours. Anemia Panel: No results for input(s): VITAMINB12, FOLATE, FERRITIN, TIBC, IRON, RETICCTPCT in the last 72 hours. Urine analysis:    Component Value Date/Time   COLORURINE YELLOW 10/27/2017 1152   APPEARANCEUR CLEAR 10/27/2017 1152   LABSPEC 1.017 10/27/2017 1152   PHURINE 5.0 10/27/2017 1152   GLUCOSEU NEGATIVE 10/27/2017 1152   HGBUR LARGE (A) 10/27/2017 1152   BILIRUBINUR NEGATIVE 10/27/2017 1152   KETONESUR NEGATIVE 10/27/2017 1152   PROTEINUR NEGATIVE 10/27/2017 1152   UROBILINOGEN 0.2 01/20/2015 1643   NITRITE NEGATIVE 10/27/2017 1152   LEUKOCYTESUR NEGATIVE 10/27/2017 1152    Radiological Exams on Admission: Ct Abdomen Pelvis W Contrast  Result  Date: 07/23/2018 CLINICAL DATA:  Chronic pancreatitis, follow-up. Left upper quadrant pain EXAM: CT ABDOMEN AND PELVIS WITH CONTRAST TECHNIQUE: Multidetector CT imaging of the abdomen and pelvis was performed using the standard protocol following bolus administration of intravenous contrast. CONTRAST:  156mL OMNIPAQUE IOHEXOL 300 MG/ML  SOLN COMPARISON:  10/27/2017 FINDINGS: Lower chest:  No contributory findings. Hepatobiliary: Borderline hepatic steatosis.Cholecystectomy with no bile duct dilatation Pancreas: Mild peripancreatic edema about the head and tail. No collection or necrosis. No ductal dilatation. Spleen: Unremarkable. Adrenals/Urinary Tract: Negative adrenals. Small right kidney likely from remote vascular insult, with small superimposed cyst. This is known per the chart. No hydronephrosis. Unremarkable bladder. Stomach/Bowel: No obstruction. No appendicitis. Right colonic diverticulosis. Vascular/Lymphatic: No acute vascular abnormality. No mass or adenopathy. Reproductive:No pathologic findings. Other: No ascites or pneumoperitoneum. Musculoskeletal: No acute abnormalities.  IMPRESSION: Mild acute pancreatitis. Electronically Signed   By: Monte Fantasia M.D.   On: 07/23/2018 04:39    Assessment/Plan Principal Problem:   Acute pancreatitis Active Problems:   Essential hypertension   Acute pancreatitis -History of recurrent pancreatitis of unclear etiology.  Patient states he has not consumed alcohol for several months.  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  Patient has been seen by GI previously and his recurrent pancreatitis is thought to be less likely related to pancreatic divisum as most patients with this are asymptomatic. -Afebrile and hemodynamically stable.  White count mildly elevated at 11.5.  LFTs normal.  Lipase 136.  CT with evidence of mild acute pancreatitis.  No collection or necrosis.  No pancreatic ductal dilatation.  Prior cholecystectomy and no bile duct dilatation. -Patient continued to have persistent abdominal pain and nausea in the ED and was not able to tolerate p.o. intake. -Normal saline at 200 cc/h -IV Dilaudid 1 mg every 4 hours as needed for severe pain -Clear liquid diet; advance as tolerated -Zofran PRN nausea  Hypertension -Currently normotensive.  Hydralazine PRN.  DVT prophylaxis: Lovenox Code Status: Full code Family Communication: No family available. Disposition Plan: Anticipate discharge in 1 to 2 days. Consults called: None Admission status: Observation, MedSurg  This chart was dictated using voice recognition software.  Despite best efforts to proofread, errors can occur which can change the documentation meaning.  Shela Leff MD [facility] Pager [phone]  If 7PM-7AM, please contact night-coverage [email or web address] Password [facility]  07/23/2018, 5:44 AM

## 2018-07-23 NOTE — ED Notes (Signed)
Gave pt urinal for urine collection 

## 2018-07-23 NOTE — Progress Notes (Signed)
Patient is a 48 year-old male with history of recurrent pancreatitis of unclear etiology, GERD, hypertension, hyperlipidemia who presented to the emergency room for the evaluation of abdominal pain and emesis.  No history of alcohol consumption.  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  He was seen by GI in the past. Presented  with lipase of 136.  CBC showed mild acute pancreatitis.  No collection or necrosis.  History of prior cholecystectomy, no CBD dilation. Admitted for management of acute pancreatitis.  Start on IV fluids.  Continue pain medication.  Clear liquid diet for now.  Continue promethazine for nausea. Patient seen by Dr.Rathore  this morning.

## 2018-07-23 NOTE — ED Provider Notes (Signed)
[facility] EMERGENCY DEPARTMENT Provider Note   CSN: [id number] Arrival date & time: 07/23/18  0247    History   Chief Complaint Chief Complaint  Patient presents with  . Abdominal Pain    HPI Kyle Berry is a 48 y.o. male.     Patient presents with upper abdominal pain that woke him from sleep about 1 AM.  This is typical of his previous episodes of "pancreatitis".  States he felt well when he went to bed.  He has sharp stabbing pain in his upper abdomen that moved from left to right and is associated with 2 episodes of nausea and vomiting.  He states his pancreas is normally well controlled has not had any issues since she was hospitalized here in December.  Denies any fevers, chills, diarrhea, pain with urination or blood in the urine.  No chest pain or shortness of breath.  Previous cholecystectomy. Denies any recent alcohol use.  Denies any blood or black stools. No leg pain or leg swelling. Denies any change in the character of his pain and this is similar to his previous pancreatitis episodes.  The history is provided by the patient.  Abdominal Pain  Associated symptoms: nausea and vomiting   Associated symptoms: no cough, no dysuria, no hematuria and no shortness of breath     Past Medical History:  Diagnosis Date  . Acute pancreatitis 11/23/2015  . Chronic bronchitis ([place])   . Chronic lower back pain   . Congenital single kidney   . GERD (gastroesophageal reflux disease)   . Headache    "once/month" (05/13/2017)  . High cholesterol   . History of kidney stones   . Hypertension   . Migraine    "a couple/year" (05/13/2017)  . Nephrolithiasis 05/13/2017  . Pneumonia ~ 09/2015  . Recurrent acute pancreatitis   . Renal disorder     Patient Active Problem List   Diagnosis Date Noted  . Pancreatic divisum   . Congenital single kidney 05/13/2017  . High cholesterol 05/13/2017  . Acute recurrent pancreatitis   . Essential hypertension   .  Esophageal reflux   . Chronic pancreatitis ([place]) 11/23/2015    Past Surgical History:  Procedure Laterality Date  . CHOLECYSTECTOMY N/A 10/23/2017   Procedure: LAPAROSCOPIC CHOLECYSTECTOMY WITH INTRAOPERATIVE CHOLANGIOGRAM;  Surgeon: Johnathan Hausen, MD;  Location: [facility];  Service: General;  Laterality: N/A;  . NO PAST SURGERIES          Home Medications    Prior to Admission medications   Medication Sig Start Date End Date Taking? Authorizing Provider  albuterol (PROVENTIL HFA;VENTOLIN HFA) 108 (90 Base) MCG/ACT inhaler Inhale 1-2 puffs into the lungs every 4 (four) hours as needed for wheezing or shortness of breath.    [provider]  Artificial Tear Ointment (DRY EYES OP) Apply 1 drop to eye daily as needed (for dry eyes).    [provider]  famotidine (PEPCID) 20 MG tablet Take 1 tablet (20 mg total) by mouth 2 (two) times daily. Patient not taking: Reported on 01/20/2018 10/14/17   Roxan Hockey, MD  feeding supplement (BOOST / RESOURCE BREEZE) LIQD Take 1 Container by mouth 3 (three) times daily between meals. Patient not taking: Reported on 01/20/2018 01/20/17   Cristal Ford, DO  lipase/protease/amylase (CREON) 36000 UNITS CPEP capsule Take 1 capsule (36,000 Units total) by mouth See admin instructions. Take one capsules (36,000 units) by mouth with meals (2 meals daily) and one capsule (36000 units) with snacks  daily 10/14/17   Roxan Hockey, MD  ondansetron (ZOFRAN ODT) 4 MG disintegrating tablet Take 1 tablet (4 mg total) by mouth every 8 (eight) hours as needed for nausea or vomiting. 04/06/18   Deno Etienne, DO  promethazine (PHENERGAN) 25 MG tablet Take 1 tablet (25 mg total) by mouth every 6 (six) hours as needed for up to 5 days for nausea or vomiting. 04/13/18 04/18/18  Carroll Sage, MD  telmisartan (MICARDIS) 40 MG tablet Take 40 mg by mouth daily. 01/02/18   [provider]    Family History Family History  Problem Relation Age of Onset   . Hypertension Other   . Hypertension Mother   . Hypertension Father   . Kidney disease Father   . Hypertension Sister   . Diabetes Sister   . Hypertension Brother     Social History Social History   Tobacco Use  . Smoking status: Never Smoker  . Smokeless tobacco: Never Used  Substance Use Topics  . Alcohol use: Not Currently    Comment: 05/13/2017 "might have beer a few times/year"  . Drug use: No     Allergies   Diclofenac; Norvasc [amlodipine besylate]; Omeprazole; and Prednisone   Review of Systems Review of Systems  Constitutional: Positive for activity change and appetite change.  HENT: Negative for congestion.   Eyes: Negative for visual disturbance.  Respiratory: Negative for cough, chest tightness and shortness of breath.   Gastrointestinal: Positive for abdominal pain, nausea and vomiting.  Genitourinary: Negative for dysuria and hematuria.  Musculoskeletal: Negative for arthralgias and myalgias.  Skin: Negative for rash.  Neurological: Negative for dizziness, weakness and headaches.    all other systems are negative except as noted in the HPI and PMH.    Physical Exam Updated Vital Signs BP (!) 147/99 (BP Location: Right Arm)   Pulse 100   Temp 98.3 F (36.8 C) (Oral)   Resp 16   Ht 6\' 1"  (1.854 m)   Wt 81.2 kg   SpO2 95%   BMI 23.62 kg/m   Physical Exam Vitals signs and nursing note reviewed.  Constitutional:      General: He is not in acute distress.    Appearance: He is well-developed.  HENT:     Head: Normocephalic and atraumatic.     Mouth/Throat:     Pharynx: No oropharyngeal exudate.  Eyes:     Conjunctiva/sclera: Conjunctivae normal.     Pupils: Pupils are equal, round, and reactive to light.  Neck:     Musculoskeletal: Normal range of motion and neck supple.     Comments: No meningismus. Cardiovascular:     Rate and Rhythm: Normal rate and regular rhythm.     Heart sounds: Normal heart sounds. No murmur.  Pulmonary:      Effort: Pulmonary effort is normal. No respiratory distress.     Breath sounds: Normal breath sounds.  Abdominal:     Palpations: Abdomen is soft.     Tenderness: There is abdominal tenderness. There is guarding. There is no rebound.     Comments: Upper abdominal pain with voluntary guarding, no rebound, no lower abdominal pain  Musculoskeletal: Normal range of motion.        General: No tenderness.  Skin:    General: Skin is warm.  Neurological:     Mental Status: He is alert and oriented to person, place, and time.     Cranial Nerves: No cranial nerve deficit.     Motor: No abnormal muscle  tone.     Coordination: Coordination normal.     Comments: No ataxia on finger to nose bilaterally. No pronator drift. 5/5 strength throughout. CN 2-12 intact.Equal grip strength. Sensation intact.   Psychiatric:        Behavior: Behavior normal.      ED Treatments / Results  Labs (all labs ordered are listed, but only abnormal results are displayed) Labs Reviewed  LIPASE, BLOOD - Abnormal; Notable for the following components:      Result Value   Lipase 136 (*)    All other components within normal limits  COMPREHENSIVE METABOLIC PANEL - Abnormal; Notable for the following components:   Sodium 134 (*)    CO2 19 (*)    Glucose, Bld 118 (*)    Creatinine, Ser 1.27 (*)    All other components within normal limits  CBC - Abnormal; Notable for the following components:   WBC 11.5 (*)    RBC 4.20 (*)    All other components within normal limits    EKG None  Radiology Ct Abdomen Pelvis W Contrast  Result Date: 07/23/2018 CLINICAL DATA:  Chronic pancreatitis, follow-up. Left upper quadrant pain EXAM: CT ABDOMEN AND PELVIS WITH CONTRAST TECHNIQUE: Multidetector CT imaging of the abdomen and pelvis was performed using the standard protocol following bolus administration of intravenous contrast. CONTRAST:  132mL OMNIPAQUE IOHEXOL 300 MG/ML  SOLN COMPARISON:  10/27/2017 FINDINGS: Lower chest:   No contributory findings. Hepatobiliary: Borderline hepatic steatosis.Cholecystectomy with no bile duct dilatation Pancreas: Mild peripancreatic edema about the head and tail. No collection or necrosis. No ductal dilatation. Spleen: Unremarkable. Adrenals/Urinary Tract: Negative adrenals. Small right kidney likely from remote vascular insult, with small superimposed cyst. This is known per the chart. No hydronephrosis. Unremarkable bladder. Stomach/Bowel: No obstruction. No appendicitis. Right colonic diverticulosis. Vascular/Lymphatic: No acute vascular abnormality. No mass or adenopathy. Reproductive:No pathologic findings. Other: No ascites or pneumoperitoneum. Musculoskeletal: No acute abnormalities. IMPRESSION: Mild acute pancreatitis. Electronically Signed   By: Monte Fantasia M.D.   On: 07/23/2018 04:39    Procedures Procedures (including critical care time)  Medications Ordered in ED Medications  sodium chloride flush (NS) 0.9 % injection 3 mL (3 mLs Intravenous Not Given 07/23/18 0324)  HYDROmorphone (DILAUDID) injection 1 mg (has no administration in time range)  ondansetron (ZOFRAN) injection 4 mg (has no administration in time range)     Initial Impression / Assessment and Plan / ED Course  I have reviewed the triage vital signs and the nursing notes.  Pertinent labs & imaging results that were available during my care of the patient were reviewed by me and considered in my medical decision making (see chart for details).       Upper abdominal pain similar to previous episodes of pancreatitis.  No fever  Patient made n.p.o., hydrated and given pain and nausea medicine. Labs show mild leukocytosis.  Lipase 136. LFT and Cr at baseline.   CT shows mild pancreatitis without complicating feature.  Patient reports persistent pain and nausea and unable to tolerate p.o.  He does not feel he will be able to tolerate clear liquids at home due to persistent pain and nausea.  He is  requesting admission.  Pain and nausea remain uncontrolled.  No vomiting witnessed in the ED however. Observation admission discussed with Dr. Marlowe Sax.    Final Clinical Impressions(s) / ED Diagnoses   Final diagnoses:  Acute pancreatitis without infection or necrosis, unspecified pancreatitis type    ED Discharge Orders  None       [name], MD 07/23/18 [phone]

## 2018-07-23 NOTE — ED Notes (Signed)
Patient transported to CT 

## 2018-07-23 NOTE — ED Triage Notes (Addendum)
Pt reports ULQ abdominal pain that moves to the right that woke him up at 1am.  Denies fever, SOB, Chest pain.  Pt reports emesis X2

## 2018-07-23 NOTE — ED Notes (Signed)
ED TO INPATIENT HANDOFF REPORT  ED Nurse Name and Phone #: [phone]  S Name/Age/Gender Kyle Berry 48 y.o. male Room/Bed: 021C/021C  Code Status   Code Status: Full Code  Home/SNF/Other Home Patient oriented to: a&ox4 Is this baseline? Yes      Chief Complaint stomach pain  Triage Note Pt reports ULQ abdominal pain that moves to the right that woke him up at 1am.  Denies fever, SOB, Chest pain.  Pt reports emesis X2   Allergies Allergies  Allergen Reactions  . Diclofenac Hives  . Norvasc [Amlodipine Besylate] Other (See Comments)    Fluid buildup in chest  . Omeprazole Other (See Comments)    MD stopped due to pancreatitis  . Prednisone Other (See Comments)    Mood swings    Level of Care/Admitting Diagnosis ED Disposition    ED Disposition Condition [facility] Area: [place] [100100]  Level of Care: Med-Surg [16]  I expect the patient will be discharged within 24 hours: Yes  LOW acuity---Tx typically complete <24 hrs---ACUTE conditions typically can be evaluated <24 hours---LABS likely to return to acceptable levels <24 hours---IS near functional baseline---EXPECTED to return to current living arrangement---NOT newly hypoxic: Meets criteria for 5C-Observation unit  Diagnosis: Acute pancreatitis [577.0.ICD-9-CM]  Admitting Physician: Shela Leff [[id number]]  Attending Physician: Shela Leff [[id number]]  PT Class (Do Not Modify): Observation [104]  PT Acc Code (Do Not Modify): Observation [10022]       B Medical/Surgery History Past Medical History:  Diagnosis Date  . Acute pancreatitis 11/23/2015  . Chronic bronchitis ([place])   . Chronic lower back pain   . Congenital single kidney   . GERD (gastroesophageal reflux disease)   . Headache    "once/month" (05/13/2017)  . High cholesterol   . History of kidney stones   . Hypertension   . Migraine    "a couple/year" (05/13/2017)  . Nephrolithiasis 05/13/2017   . Pneumonia ~ 09/2015  . Recurrent acute pancreatitis   . Renal disorder    Past Surgical History:  Procedure Laterality Date  . CHOLECYSTECTOMY N/A 10/23/2017   Procedure: LAPAROSCOPIC CHOLECYSTECTOMY WITH INTRAOPERATIVE CHOLANGIOGRAM;  Surgeon: Johnathan Hausen, MD;  Location: [facility];  Service: General;  Laterality: N/A;  . NO PAST SURGERIES       A IV Location/Drains/Wounds Patient Lines/Drains/Airways Status   Active Line/Drains/Airways    Name:   Placement date:   Placement time:   Site:   Days:   Peripheral IV 07/23/18 Right Antecubital   07/23/18    0335    Antecubital   less than 1   Incision (Closed) 10/23/17 Abdomen Other (Comment)   10/23/17    1256     273   Incision - 4 Ports Abdomen Right;Lateral Right;Medial Umbilicus Left;Upper   46/27/03    1255     273          Intake/Output Last 24 hours No intake or output data in the 24 hours ending 07/23/18 0554  Labs/Imaging Results for orders placed or performed during the hospital encounter of 07/23/18 (from the past 48 hour(s))  Lipase, blood     Status: Abnormal   Collection Time: 07/23/18  2:59 AM  Result Value Ref Range   Lipase 136 (H) 11 - 51 U/L    Comment: Performed at [facility], [address] [place], [place] [address]  Comprehensive metabolic panel     Status: Abnormal   Collection Time: 07/23/18  2:59 AM  Result Value Ref Range   Sodium 134 (L) 135 - 145 mmol/L   Potassium 3.6 3.5 - 5.1 mmol/L   Chloride 105 98 - 111 mmol/L   CO2 19 (L) 22 - 32 mmol/L   Glucose, Bld 118 (H) 70 - 99 mg/dL   BUN 9 6 - 20 mg/dL   Creatinine, Ser 1.27 (H) 0.61 - 1.24 mg/dL   Calcium 9.2 8.9 - 10.3 mg/dL   Total Protein 7.0 6.5 - 8.1 g/dL   Albumin 3.9 3.5 - 5.0 g/dL   AST 32 15 - 41 U/L   ALT 26 0 - 44 U/L   Alkaline Phosphatase 66 38 - 126 U/L   Total Bilirubin 0.5 0.3 - 1.2 mg/dL   GFR calc non Af Amer >60 >60 mL/min   GFR calc Af Amer >60 >60 mL/min   Anion gap 10 5 - 15    Comment: Performed at [facility], [place] [address], [place], [place] [address]  CBC     Status: Abnormal   Collection Time: 07/23/18  2:59 AM  Result Value Ref Range   WBC 11.5 (H) 4.0 - 10.5 K/uL   RBC 4.20 (L) 4.22 - 5.81 MIL/uL   Hemoglobin 13.4 13.0 - 17.0 g/dL   HCT 39.6 39.0 - 52.0 %   MCV 94.3 80.0 - 100.0 fL   MCH 31.9 26.0 - 34.0 pg   MCHC 33.8 30.0 - 36.0 g/dL   RDW 13.7 11.5 - 15.5 %   Platelets 196 150 - 400 K/uL   nRBC 0.0 0.0 - 0.2 %    Comment: Performed at [facility], [place] [address], [place], [place] [address]   Ct Abdomen Pelvis W Contrast  Result Date: 07/23/2018 CLINICAL DATA:  Chronic pancreatitis, follow-up. Left upper quadrant pain EXAM: CT ABDOMEN AND PELVIS WITH CONTRAST TECHNIQUE: Multidetector CT imaging of the abdomen and pelvis was performed using the standard protocol following bolus administration of intravenous contrast. CONTRAST:  152mL OMNIPAQUE IOHEXOL 300 MG/ML  SOLN COMPARISON:  10/27/2017 FINDINGS: Lower chest:  No contributory findings. Hepatobiliary: Borderline hepatic steatosis.Cholecystectomy with no bile duct dilatation Pancreas: Mild peripancreatic edema about the head and tail. No collection or necrosis. No ductal dilatation. Spleen: Unremarkable. Adrenals/Urinary Tract: Negative adrenals. Small right kidney likely from remote vascular insult, with small superimposed cyst. This is known per the chart. No hydronephrosis. Unremarkable bladder. Stomach/Bowel: No obstruction. No appendicitis. Right colonic diverticulosis. Vascular/Lymphatic: No acute vascular abnormality. No mass or adenopathy. Reproductive:No pathologic findings. Other: No ascites or pneumoperitoneum. Musculoskeletal: No acute abnormalities. IMPRESSION: Mild acute pancreatitis. Electronically Signed   By: Monte Fantasia M.D.   On: 07/23/2018 04:39    Pending Labs Unresulted Labs (From admission, onward)   None      Vitals/Pain Today's Vitals   07/23/18 0252 07/23/18 0253 07/23/18 0400 07/23/18  0500  BP: (!) 147/99  (!) 150/99 140/90  Pulse: 100  94 96  Resp: 16     Temp: 98.3 F (36.8 C)     TempSrc: Oral     SpO2: 95%  97% 95%  Weight:  81.2 kg    Height:  6\' 1"  (1.854 m)    PainSc:  10-Worst pain ever      Isolation Precautions No active isolations  Medications Medications  sodium chloride flush (NS) 0.9 % injection 3 mL (3 mLs Intravenous Not Given 07/23/18 0324)  HYDROmorphone (DILAUDID) injection 1 mg (has no administration in time range)  ondansetron (  ZOFRAN) injection 4 mg (has no administration in time range)  enoxaparin (LOVENOX) injection 40 mg (has no administration in time range)  acetaminophen (TYLENOL) tablet 650 mg (has no administration in time range)    Or  acetaminophen (TYLENOL) suppository 650 mg (has no administration in time range)  0.9 %  sodium chloride infusion (has no administration in time range)  hydrALAZINE (APRESOLINE) injection 5 mg (has no administration in time range)  HYDROmorphone (DILAUDID) injection 1 mg (1 mg Intravenous Given 07/23/18 0336)  ondansetron (ZOFRAN) injection 4 mg (4 mg Intravenous Given 07/23/18 0336)  sodium chloride 0.9 % bolus 1,000 mL (1,000 mLs Intravenous New Bag/Given 07/23/18 0438)  sodium chloride 0.9 % bolus 1,000 mL (1,000 mLs Intravenous New Bag/Given 07/23/18 0438)  HYDROmorphone (DILAUDID) injection 1 mg (1 mg Intravenous Given 07/23/18 0440)  iohexol (OMNIPAQUE) 300 MG/ML solution 100 mL (100 mLs Intravenous Contrast Given 07/23/18 0422)    Mobility walks     Focused Assessments    R Recommendations: See Admitting Provider Note  Report given to:   Additional Notes: n/a

## 2018-07-23 NOTE — ED Notes (Signed)
Attempted report 

## 2018-07-23 NOTE — ED Notes (Signed)
Report called to Brynda Greathouse, RN

## 2018-07-24 DIAGNOSIS — K859 Acute pancreatitis without necrosis or infection, unspecified: Secondary | ICD-10-CM | POA: Diagnosis present

## 2018-07-24 DIAGNOSIS — K8500 Idiopathic acute pancreatitis without necrosis or infection: Secondary | ICD-10-CM | POA: Diagnosis not present

## 2018-07-24 DIAGNOSIS — G43909 Migraine, unspecified, not intractable, without status migrainosus: Secondary | ICD-10-CM | POA: Diagnosis present

## 2018-07-24 DIAGNOSIS — Z8249 Family history of ischemic heart disease and other diseases of the circulatory system: Secondary | ICD-10-CM | POA: Diagnosis not present

## 2018-07-24 DIAGNOSIS — Z886 Allergy status to analgesic agent status: Secondary | ICD-10-CM | POA: Diagnosis not present

## 2018-07-24 DIAGNOSIS — Z9049 Acquired absence of other specified parts of digestive tract: Secondary | ICD-10-CM | POA: Diagnosis not present

## 2018-07-24 DIAGNOSIS — R1013 Epigastric pain: Secondary | ICD-10-CM | POA: Diagnosis present

## 2018-07-24 DIAGNOSIS — Q600 Renal agenesis, unilateral: Secondary | ICD-10-CM | POA: Diagnosis not present

## 2018-07-24 DIAGNOSIS — Z888 Allergy status to other drugs, medicaments and biological substances status: Secondary | ICD-10-CM | POA: Diagnosis not present

## 2018-07-24 DIAGNOSIS — Z87442 Personal history of urinary calculi: Secondary | ICD-10-CM | POA: Diagnosis not present

## 2018-07-24 DIAGNOSIS — K219 Gastro-esophageal reflux disease without esophagitis: Secondary | ICD-10-CM | POA: Diagnosis present

## 2018-07-24 DIAGNOSIS — Z79899 Other long term (current) drug therapy: Secondary | ICD-10-CM | POA: Diagnosis not present

## 2018-07-24 DIAGNOSIS — N182 Chronic kidney disease, stage 2 (mild): Secondary | ICD-10-CM | POA: Diagnosis present

## 2018-07-24 DIAGNOSIS — K861 Other chronic pancreatitis: Secondary | ICD-10-CM | POA: Diagnosis present

## 2018-07-24 DIAGNOSIS — Q453 Other congenital malformations of pancreas and pancreatic duct: Secondary | ICD-10-CM | POA: Diagnosis not present

## 2018-07-24 DIAGNOSIS — Z841 Family history of disorders of kidney and ureter: Secondary | ICD-10-CM | POA: Diagnosis not present

## 2018-07-24 DIAGNOSIS — I129 Hypertensive chronic kidney disease with stage 1 through stage 4 chronic kidney disease, or unspecified chronic kidney disease: Secondary | ICD-10-CM | POA: Diagnosis present

## 2018-07-24 DIAGNOSIS — E785 Hyperlipidemia, unspecified: Secondary | ICD-10-CM | POA: Diagnosis present

## 2018-07-24 LAB — CBC WITH DIFFERENTIAL/PLATELET
Abs Immature Granulocytes: 0.02 10*3/uL (ref 0.00–0.07)
Basophils Absolute: 0 10*3/uL (ref 0.0–0.1)
Basophils Relative: 0 %
Eosinophils Absolute: 0.1 10*3/uL (ref 0.0–0.5)
Eosinophils Relative: 1 %
HCT: 38.7 % — ABNORMAL LOW (ref 39.0–52.0)
Hemoglobin: 13 g/dL (ref 13.0–17.0)
Immature Granulocytes: 0 %
Lymphocytes Relative: 20 %
Lymphs Abs: 1.7 10*3/uL (ref 0.7–4.0)
MCH: 31.9 pg (ref 26.0–34.0)
MCHC: 33.6 g/dL (ref 30.0–36.0)
MCV: 94.9 fL (ref 80.0–100.0)
Monocytes Absolute: 0.5 10*3/uL (ref 0.1–1.0)
Monocytes Relative: 5 %
Neutro Abs: 6.1 10*3/uL (ref 1.7–7.7)
Neutrophils Relative %: 74 %
Platelets: 188 10*3/uL (ref 150–400)
RBC: 4.08 MIL/uL — ABNORMAL LOW (ref 4.22–5.81)
RDW: 13.6 % (ref 11.5–15.5)
WBC: 8.4 10*3/uL (ref 4.0–10.5)
nRBC: 0 % (ref 0.0–0.2)

## 2018-07-24 LAB — BASIC METABOLIC PANEL
Anion gap: 9 (ref 5–15)
BUN: <5 mg/dL — ABNORMAL LOW (ref 6–20)
CO2: 26 mmol/L (ref 22–32)
Calcium: 8.7 mg/dL — ABNORMAL LOW (ref 8.9–10.3)
Chloride: 101 mmol/L (ref 98–111)
Creatinine, Ser: 1.15 mg/dL (ref 0.61–1.24)
GFR calc Af Amer: >60 mL/min (ref >60)
GFR calc non Af Amer: >60 mL/min (ref >60)
Glucose, Bld: 118 mg/dL — ABNORMAL HIGH (ref 70–99)
Potassium: 3.9 mmol/L (ref 3.5–5.1)
Sodium: 136 mmol/L (ref 135–145)

## 2018-07-24 MED ORDER — HYDROMORPHONE HCL 1 MG/ML IJ SOLN
0.5000 mg | INTRAMUSCULAR | Status: DC | PRN
  Administered 2018-07-24 – 2018-07-25 (×3): 0.5 mg via INTRAVENOUS
  Filled 2018-07-24 (×3): qty 0.5

## 2018-07-24 MED ORDER — PANCRELIPASE (LIP-PROT-AMYL) 12000-38000 UNITS PO CPEP
36000.0000 [IU] | ORAL_CAPSULE | Freq: Three times a day (TID) | ORAL | Status: DC
  Administered 2018-07-25 – 2018-07-27 (×6): 36000 [IU] via ORAL
  Filled 2018-07-24 (×6): qty 3

## 2018-07-24 MED ORDER — IRBESARTAN 300 MG PO TABS
150.0000 mg | ORAL_TABLET | Freq: Every day | ORAL | Status: DC
  Administered 2018-07-24 – 2018-07-27 (×4): 150 mg via ORAL
  Filled 2018-07-24 (×4): qty 1

## 2018-07-24 NOTE — Progress Notes (Signed)
PROGRESS NOTE    Kyle Berry  MBW:[id number] DOB: 11-Feb-1971 DOA: 07/23/2018 PCP: Kerin Perna, NP   Brief Narrative: Patient is a 48 year-old male with history of recurrent pancreatitis of unclear etiology, GERD, hypertension, hyperlipidemia who presented to the emergency room for the evaluation of abdominal pain and emesis.  No history of alcohol consumption.  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  He was seen by GI in the past. Presented  with lipase of 136.  CBC showed mild acute pancreatitis.  No collection or necrosis.  History of prior cholecystectomy, no CBD dilation. Admitted for management of acute pancreatitis.  Started on IV fluids.   Assessment & Plan:   Principal Problem:   Acute pancreatitis Active Problems:   Essential hypertension  Acute pancreatitis: Presented with abdominal pain, nausea and vomiting.  History of recurrent pancreatitis of unclear etiology.  He has not consumed alcohol for last several months. On his prior imaging, pancreatic divisum was demonstrated.  He was seen by GI in the past and concluded that his recurrent pancreatitis is less likely due to pancreatic divisum as most patients with this are asymptomatic. Continue IV fluids.  Continue pain medications, antiemetics. Abdominal pain has slightly improved today.  But he still complains of discomfort.  Currently on clear liquid diet and he is tolerating.  No bowel movement. Lipase of 136 on presentation.  CT showed mild acute mucositis.  No collection or necrosis.  No pancreatic duct dilation.  He has history of cholecystectomy.  No biliary duct dilation.  Hypertension: Currently normotensive.  Continue PRN meds         DVT prophylaxis: Lovenox Code Status: Full Family Communication: None present at the bedside Disposition Plan: Home after resolution of abdominal pain   Consultants: None  Procedures: None  Antimicrobials:  Anti-infectives (From admission,  onward)   None      Subjective: Patient seen and examined the bedside this morning.  Hemodynamically stable.  Looks more comfortable than yesterday but says that he still has abdominal pain and nausea.  Objective: Vitals:   07/23/18 0825 07/23/18 1603 07/23/18 2102 07/24/18 0457  BP: (!) 158/95 (!) 133/93 (!) 147/82 (!) 141/99  Pulse: 91 83 68 71  Resp: 18  18 18   Temp: 97.7 F (36.5 C) 98.2 F (36.8 C) (!) 97.5 F (36.4 C) 98.6 F (37 C)  TempSrc: Oral Oral Oral Oral  SpO2: 96% 96% 98% 96%  Weight: 78.6 kg     Height: 6\' 1"  (1.854 m)       Intake/Output Summary (Last 24 hours) at 07/24/2018 1120 Last data filed at 07/23/2018 1825 Gross per 24 hour  Intake 1489.71 ml  Output -  Net 1489.71 ml   Filed Weights   07/23/18 0253 07/23/18 0825  Weight: 81.2 kg 78.6 kg    Examination:  General exam: Appears calm and comfortable ,Not in distress,average built HEENT:PERRL,Oral mucosa moist, Ear/Nose normal on gross exam Respiratory system: Bilateral equal air entry, normal vesicular breath sounds, no wheezes or crackles  Cardiovascular system: S1 & S2 heard, RRR. No JVD, murmurs, rubs, gallops or clicks. No pedal edema. Gastrointestinal system: Abdomen is nondistended, soft .  Epigastric tenderness. No organomegaly or masses felt. Normal bowel sounds heard. Central nervous system: Alert and oriented. No focal neurological deficits. Extremities: No edema, no clubbing ,no cyanosis, distal peripheral pulses palpable. Skin: No rashes, lesions or ulcers,no icterus ,no pallor MSK: Normal muscle bulk,tone ,power Psychiatry: Judgement and insight appear normal. Mood & affect  appropriate.     Data Reviewed: I have personally reviewed following labs and imaging studies  CBC: Recent Labs  Lab 07/23/18 0259 07/24/18 0232  WBC 11.5* 8.4  NEUTROABS  --  6.1  HGB 13.4 13.0  HCT 39.6 38.7*  MCV 94.3 94.9  PLT 196 098   Basic Metabolic Panel: Recent Labs  Lab 07/23/18 0259  07/24/18 0232  NA 134* 136  K 3.6 3.9  CL 105 101  CO2 19* 26  GLUCOSE 118* 118*  BUN 9 <5*  CREATININE 1.27* 1.15  CALCIUM 9.2 8.7*   GFR: Estimated Creatinine Clearance: 87.3 mL/min (by C-G formula based on SCr of 1.15 mg/dL). Liver Function Tests: Recent Labs  Lab 07/23/18 0259  AST 32  ALT 26  ALKPHOS 66  BILITOT 0.5  PROT 7.0  ALBUMIN 3.9   Recent Labs  Lab 07/23/18 0259  LIPASE 136*   No results for input(s): AMMONIA in the last 168 hours. Coagulation Profile: No results for input(s): INR, PROTIME in the last 168 hours. Cardiac Enzymes: No results for input(s): CKTOTAL, CKMB, CKMBINDEX, TROPONINI in the last 168 hours. BNP (last 3 results) No results for input(s): PROBNP in the last 8760 hours. HbA1C: No results for input(s): HGBA1C in the last 72 hours. CBG: No results for input(s): GLUCAP in the last 168 hours. Lipid Profile: No results for input(s): CHOL, HDL, LDLCALC, TRIG, CHOLHDL, LDLDIRECT in the last 72 hours. Thyroid Function Tests: No results for input(s): TSH, T4TOTAL, FREET4, T3FREE, THYROIDAB in the last 72 hours. Anemia Panel: No results for input(s): VITAMINB12, FOLATE, FERRITIN, TIBC, IRON, RETICCTPCT in the last 72 hours. Sepsis Labs: No results for input(s): PROCALCITON, LATICACIDVEN in the last 168 hours.  No results found for this or any previous visit (from the past 240 hour(s)).       Radiology Studies: Ct Abdomen Pelvis W Contrast  Result Date: 07/23/2018 CLINICAL DATA:  Chronic pancreatitis, follow-up. Left upper quadrant pain EXAM: CT ABDOMEN AND PELVIS WITH CONTRAST TECHNIQUE: Multidetector CT imaging of the abdomen and pelvis was performed using the standard protocol following bolus administration of intravenous contrast. CONTRAST:  180mL OMNIPAQUE IOHEXOL 300 MG/ML  SOLN COMPARISON:  10/27/2017 FINDINGS: Lower chest:  No contributory findings. Hepatobiliary: Borderline hepatic steatosis.Cholecystectomy with no bile duct  dilatation Pancreas: Mild peripancreatic edema about the head and tail. No collection or necrosis. No ductal dilatation. Spleen: Unremarkable. Adrenals/Urinary Tract: Negative adrenals. Small right kidney likely from remote vascular insult, with small superimposed cyst. This is known per the chart. No hydronephrosis. Unremarkable bladder. Stomach/Bowel: No obstruction. No appendicitis. Right colonic diverticulosis. Vascular/Lymphatic: No acute vascular abnormality. No mass or adenopathy. Reproductive:No pathologic findings. Other: No ascites or pneumoperitoneum. Musculoskeletal: No acute abnormalities. IMPRESSION: Mild acute pancreatitis. Electronically Signed   By: Monte Fantasia M.D.   On: 07/23/2018 04:39        Scheduled Meds: . enoxaparin (LOVENOX) injection  40 mg Subcutaneous Q24H  . sodium chloride flush  3 mL Intravenous Once   Continuous Infusions:   LOS: 0 days    Time spent: 25 mins.More than 50% of that time was spent in counseling and/or coordination of care.      Shelly Coss, MD [facility] Pager [phone]  If 7PM-7AM, please contact night-coverage [email or web address] Password [email or web address] 07/24/2018, 11:20 AM

## 2018-07-25 MED ORDER — HYDROMORPHONE HCL 1 MG/ML IJ SOLN
0.5000 mg | Freq: Four times a day (QID) | INTRAMUSCULAR | Status: DC | PRN
  Administered 2018-07-25 – 2018-07-26 (×3): 0.5 mg via INTRAVENOUS
  Filled 2018-07-25 (×4): qty 0.5

## 2018-07-25 MED ORDER — SODIUM CHLORIDE 0.9 % IV SOLN
INTRAVENOUS | Status: DC
  Administered 2018-07-25 – 2018-07-27 (×5): via INTRAVENOUS

## 2018-07-25 NOTE — Progress Notes (Addendum)
PROGRESS NOTE    Kyle Berry  KNL:[id number] DOB: [birth year] DOA: 07/23/2018 PCP: Kerin Perna, NP   Brief Narrative: Patient is a 48 year-old male with history of recurrent pancreatitis of unclear etiology, GERD, hypertension, hyperlipidemia who presented to the emergency room for the evaluation of abdominal pain and emesis.  No history of alcohol consumption.  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  He was seen by GI in the past. Presented  with lipase of 136.  CBC showed mild acute pancreatitis.  No collection or necrosis.  History of prior cholecystectomy, no CBD dilation. Admitted for management of acute pancreatitis.  Started on IV fluids.   Assessment & Plan:   Principal Problem:   Acute pancreatitis Active Problems:   Essential hypertension  Acute pancreatitis: Presented with abdominal pain, nausea and vomiting.  History of recurrent pancreatitis of unclear etiology.  He has not consumed alcohol for last several months. On his prior imaging, pancreatic divisum was demonstrated.  He was seen by GI in the past and concluded that his recurrent pancreatitis is less likely due to pancreatic divisum as most patients with this are asymptomatic. Continue IV fluids.  Continue pain medications, antiemetics. Abdominal pain has slightly improved today.  But he still complains of discomfort.  Currently on full liquid diet and he is almost tolerating.  Will advance to soft diet if he can tolerate. Lipase of 136 on presentation.  CT showed mild acute mucositis.  No collection or necrosis.  No pancreatic duct dilation.  He has history of cholecystectomy.  No biliary duct dilation. He is also on Creon at home which we will continue.  Hypertension: Currently normotensive.  Continue current meds         DVT prophylaxis: Lovenox Code Status: Full Family Communication: None present at the bedside Disposition Plan: DC tomorrow to Home   Consultants: None   Procedures: None  Antimicrobials:  Anti-infectives (From admission, onward)   None      Subjective: Patient seen and examined the bedside this morning.  Looks comfortable, hemodynamically stable.  He is on full liquid diet.  He says he still has abdominal discomfort but has been tolerating full liquid diet so far.  Objective: Vitals:   07/24/18 1407 07/24/18 2107 07/25/18 0538 07/25/18 0903  BP: (!) 132/98 (!) 148/93 118/89 118/81  Pulse: 82 81 63 78  Resp: 16 18 18    Temp: 98.7 F (37.1 C) 98.9 F (37.2 C) 98.5 F (36.9 C) 98.5 F (36.9 C)  TempSrc: Oral Oral Oral Oral  SpO2: 96% 96% 97% 96%  Weight:      Height:        Intake/Output Summary (Last 24 hours) at 07/25/2018 1129 Last data filed at 07/24/2018 1300 Gross per 24 hour  Intake 250 ml  Output -  Net 250 ml   Filed Weights   07/23/18 0253 07/23/18 0825  Weight: 81.2 kg 78.6 kg    Examination:  General exam: Appears calm and comfortable ,Not in distress,average built HEENT:PERRL,Oral mucosa moist, Ear/Nose normal on gross exam Respiratory system: Bilateral equal air entry, normal vesicular breath sounds, no wheezes or crackles  Cardiovascular system: S1 & S2 heard, RRR. No JVD, murmurs, rubs, gallops or clicks. Gastrointestinal system: Abdomen is nondistended, soft .Epigastric tendereness. No organomegaly or masses felt. Normal bowel sounds heard. Central nervous system: Alert and oriented. No focal neurological deficits. Extremities: No edema, no clubbing ,no cyanosis, distal peripheral pulses palpable. Skin: No rashes, lesions or ulcers,no icterus ,no pallor  MSK: Normal muscle bulk,tone ,power Psychiatry: Judgement and insight appear normal. Mood & affect appropriate.      Data Reviewed: I have personally reviewed following labs and imaging studies  CBC: Recent Labs  Lab 07/23/18 0259 07/24/18 0232  WBC 11.5* 8.4  NEUTROABS  --  6.1  HGB 13.4 13.0  HCT 39.6 38.7*  MCV 94.3 94.9  PLT 196 793    Basic Metabolic Panel: Recent Labs  Lab 07/23/18 0259 07/24/18 0232  NA 134* 136  K 3.6 3.9  CL 105 101  CO2 19* 26  GLUCOSE 118* 118*  BUN 9 <5*  CREATININE 1.27* 1.15  CALCIUM 9.2 8.7*   GFR: Estimated Creatinine Clearance: 87.3 mL/min (by C-G formula based on SCr of 1.15 mg/dL). Liver Function Tests: Recent Labs  Lab 07/23/18 0259  AST 32  ALT 26  ALKPHOS 66  BILITOT 0.5  PROT 7.0  ALBUMIN 3.9   Recent Labs  Lab 07/23/18 0259  LIPASE 136*   No results for input(s): AMMONIA in the last 168 hours. Coagulation Profile: No results for input(s): INR, PROTIME in the last 168 hours. Cardiac Enzymes: No results for input(s): CKTOTAL, CKMB, CKMBINDEX, TROPONINI in the last 168 hours. BNP (last 3 results) No results for input(s): PROBNP in the last 8760 hours. HbA1C: No results for input(s): HGBA1C in the last 72 hours. CBG: No results for input(s): GLUCAP in the last 168 hours. Lipid Profile: No results for input(s): CHOL, HDL, LDLCALC, TRIG, CHOLHDL, LDLDIRECT in the last 72 hours. Thyroid Function Tests: No results for input(s): TSH, T4TOTAL, FREET4, T3FREE, THYROIDAB in the last 72 hours. Anemia Panel: No results for input(s): VITAMINB12, FOLATE, FERRITIN, TIBC, IRON, RETICCTPCT in the last 72 hours. Sepsis Labs: No results for input(s): PROCALCITON, LATICACIDVEN in the last 168 hours.  No results found for this or any previous visit (from the past 240 hour(s)).       Radiology Studies: No results found.      Scheduled Meds: . enoxaparin (LOVENOX) injection  40 mg Subcutaneous Q24H  . irbesartan  150 mg Oral Daily  . lipase/protease/amylase  36,000 Units Oral TID WC  . sodium chloride flush  3 mL Intravenous Once   Continuous Infusions: . sodium chloride       LOS: 1 day    Time spent: 25 mins.More than 50% of that time was spent in counseling and/or coordination of care.      Shelly Coss, MD [facility] Pager [phone]   If 7PM-7AM, please contact night-coverage [email or web address] Password [facility] 07/25/2018, 11:29 AM

## 2018-07-26 MED ORDER — HYDROMORPHONE HCL 1 MG/ML IJ SOLN
0.5000 mg | INTRAMUSCULAR | Status: DC | PRN
  Administered 2018-07-26 – 2018-07-27 (×6): 0.5 mg via INTRAVENOUS
  Filled 2018-07-26 (×5): qty 0.5

## 2018-07-26 NOTE — Progress Notes (Signed)
PROGRESS NOTE    Kyle Berry  MWN:[id number] DOB: [birth year] DOA: 07/23/2018 PCP: Kerin Perna, NP   Brief Narrative: Patient is a 48 year-old male with history of recurrent pancreatitis of unclear etiology, GERD, hypertension, hyperlipidemia who presented to the emergency room for the evaluation of abdominal pain and emesis.  No history of alcohol consumption.  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  He was seen by GI in the past. Presented  with lipase of 136.  CBC showed mild acute pancreatitis.  No collection or necrosis.  History of prior cholecystectomy, no CBD dilation. Admitted for management of acute pancreatitis.  Started on IV fluids.  Diet was advanced but he again complained of severe abdominal pain and vomited this morning.  Diet downgraded to clear liquid.  Assessment & Plan:   Principal Problem:   Acute pancreatitis Active Problems:   Essential hypertension  Acute pancreatitis: Presented with abdominal pain, nausea and vomiting.  History of recurrent pancreatitis of unclear etiology.  He has not consumed alcohol for last several months. On his prior imaging, pancreatic divisum was demonstrated.  He was seen by GI in the past and concluded that his recurrent pancreatitis is less likely due to pancreatic divisum as most patients with this are asymptomatic. Continue IV fluids.  Continue pain medications, antiemetics. Lipase of 136 on presentation.  CT showed mild acute pancreatitis.No collection or necrosis.  No pancreatic duct dilation.  He has history of cholecystectomy.  No biliary duct dilation. He is also on Creon at home which we will continue. Abdominal pain was improving and diet advanced to soft but he again complained of severe pain this morning and vomited.  Will downgrade the diet to clear liquid.  Hypertension: Currently normotensive.  Continue current meds        DVT prophylaxis: Lovenox Code Status: Full Family Communication:  None present at the bedside Disposition Plan: DC tomorrow after resolution of abdominal pain, nausea and vomiting Consultants: None  Procedures: None  Antimicrobials:  Anti-infectives (From admission, onward)   None      Subjective: Patient seen and examined the bedside this morning.  Diet was advanced to soft.  He ate his breakfast today.  After the breakfast, he started having severe abdominal pain and vomited.  Objective: Vitals:   07/25/18 0538 07/25/18 0903 07/25/18 2233 07/26/18 0536  BP: 118/89 118/81 (!) 118/98 (!) 139/95  Pulse: 63 78 66 67  Resp: 18  20 18   Temp: 98.5 F (36.9 C) 98.5 F (36.9 C) 98.4 F (36.9 C) 98.1 F (36.7 C)  TempSrc: Oral Oral Oral Oral  SpO2: 97% 96% 95% 97%  Weight:      Height:        Intake/Output Summary (Last 24 hours) at 07/26/2018 1126 Last data filed at 07/26/2018 0900 Gross per 24 hour  Intake 1802.34 ml  Output -  Net 1802.34 ml   Filed Weights   07/23/18 0253 07/23/18 0825  Weight: 81.2 kg 78.6 kg    Examination:  General exam: Appears calm and comfortable ,Not in distress,average built HEENT:PERRL,Oral mucosa moist, Ear/Nose normal on gross exam Respiratory system: Bilateral equal air entry, normal vesicular breath sounds, no wheezes or crackles  Cardiovascular system: S1 & S2 heard, RRR. No JVD, murmurs, rubs, gallops or clicks. Gastrointestinal system: Abdomen is nondistended, soft .severe tenderness on the epigastric region. Central nervous system: Alert and oriented. No focal neurological deficits. Extremities: No edema, no clubbing ,no cyanosis, distal peripheral pulses palpable. Skin: No rashes,  lesions or ulcers,no icterus ,no pallor  Data Reviewed: I have personally reviewed following labs and imaging studies  CBC: Recent Labs  Lab 07/23/18 0259 07/24/18 0232  WBC 11.5* 8.4  NEUTROABS  --  6.1  HGB 13.4 13.0  HCT 39.6 38.7*  MCV 94.3 94.9  PLT 196 258   Basic Metabolic Panel: Recent Labs  Lab  07/23/18 0259 07/24/18 0232  NA 134* 136  K 3.6 3.9  CL 105 101  CO2 19* 26  GLUCOSE 118* 118*  BUN 9 <5*  CREATININE 1.27* 1.15  CALCIUM 9.2 8.7*   GFR: Estimated Creatinine Clearance: 87.3 mL/min (by C-G formula based on SCr of 1.15 mg/dL). Liver Function Tests: Recent Labs  Lab 07/23/18 0259  AST 32  ALT 26  ALKPHOS 66  BILITOT 0.5  PROT 7.0  ALBUMIN 3.9   Recent Labs  Lab 07/23/18 0259  LIPASE 136*   No results for input(s): AMMONIA in the last 168 hours. Coagulation Profile: No results for input(s): INR, PROTIME in the last 168 hours. Cardiac Enzymes: No results for input(s): CKTOTAL, CKMB, CKMBINDEX, TROPONINI in the last 168 hours. BNP (last 3 results) No results for input(s): PROBNP in the last 8760 hours. HbA1C: No results for input(s): HGBA1C in the last 72 hours. CBG: No results for input(s): GLUCAP in the last 168 hours. Lipid Profile: No results for input(s): CHOL, HDL, LDLCALC, TRIG, CHOLHDL, LDLDIRECT in the last 72 hours. Thyroid Function Tests: No results for input(s): TSH, T4TOTAL, FREET4, T3FREE, THYROIDAB in the last 72 hours. Anemia Panel: No results for input(s): VITAMINB12, FOLATE, FERRITIN, TIBC, IRON, RETICCTPCT in the last 72 hours. Sepsis Labs: No results for input(s): PROCALCITON, LATICACIDVEN in the last 168 hours.  No results found for this or any previous visit (from the past 240 hour(s)).       Radiology Studies: No results found.      Scheduled Meds: . enoxaparin (LOVENOX) injection  40 mg Subcutaneous Q24H  . irbesartan  150 mg Oral Daily  . lipase/protease/amylase  36,000 Units Oral TID WC  . sodium chloride flush  3 mL Intravenous Once   Continuous Infusions: . sodium chloride 100 mL/hr at 07/26/18 0655     LOS: 2 days    Time spent: 25 mins.More than 50% of that time was spent in counseling and/or coordination of care.      Shelly Coss, MD [facility] Pager [phone]  If 7PM-7AM,  please contact night-coverage [email or web address] Password [email or web address] 07/26/2018, 11:26 AM

## 2018-07-27 LAB — CBC WITH DIFFERENTIAL/PLATELET
Abs Immature Granulocytes: 0.01 10*3/uL (ref 0.00–0.07)
Basophils Absolute: 0 10*3/uL (ref 0.0–0.1)
Basophils Relative: 0 %
Eosinophils Absolute: 0.1 10*3/uL (ref 0.0–0.5)
Eosinophils Relative: 2 %
HCT: 35.3 % — ABNORMAL LOW (ref 39.0–52.0)
Hemoglobin: 12 g/dL — ABNORMAL LOW (ref 13.0–17.0)
Immature Granulocytes: 0 %
Lymphocytes Relative: 42 %
Lymphs Abs: 2.6 10*3/uL (ref 0.7–4.0)
MCH: 32.6 pg (ref 26.0–34.0)
MCHC: 34 g/dL (ref 30.0–36.0)
MCV: 95.9 fL (ref 80.0–100.0)
Monocytes Absolute: 0.4 10*3/uL (ref 0.1–1.0)
Monocytes Relative: 7 %
Neutro Abs: 3 10*3/uL (ref 1.7–7.7)
Neutrophils Relative %: 49 %
Platelets: 181 10*3/uL (ref 150–400)
RBC: 3.68 MIL/uL — ABNORMAL LOW (ref 4.22–5.81)
RDW: 13.5 % (ref 11.5–15.5)
WBC: 6.2 10*3/uL (ref 4.0–10.5)
nRBC: 0 % (ref 0.0–0.2)

## 2018-07-27 LAB — BASIC METABOLIC PANEL
Anion gap: 9 (ref 5–15)
BUN: <5 mg/dL — ABNORMAL LOW (ref 6–20)
CO2: 26 mmol/L (ref 22–32)
Calcium: 8.8 mg/dL — ABNORMAL LOW (ref 8.9–10.3)
Chloride: 103 mmol/L (ref 98–111)
Creatinine, Ser: 1.26 mg/dL — ABNORMAL HIGH (ref 0.61–1.24)
GFR calc Af Amer: >60 mL/min (ref >60)
GFR calc non Af Amer: >60 mL/min (ref >60)
Glucose, Bld: 102 mg/dL — ABNORMAL HIGH (ref 70–99)
Potassium: 3.4 mmol/L — ABNORMAL LOW (ref 3.5–5.1)
Sodium: 138 mmol/L (ref 135–145)

## 2018-07-27 MED ORDER — POTASSIUM CHLORIDE CRYS ER 20 MEQ PO TBCR
40.0000 meq | EXTENDED_RELEASE_TABLET | Freq: Once | ORAL | Status: AC
  Administered 2018-07-27: 09:00:00 40 meq via ORAL
  Filled 2018-07-27: qty 2

## 2018-07-27 NOTE — Discharge Summary (Signed)
Physician Discharge Summary  Kyle Berry VQM:[id number] DOB: 1970/09/14 DOA: 07/23/2018  PCP: Kerin Perna, NP  Admit date: 07/23/2018 Discharge date: 07/27/2018  Admitted From: Home Disposition:  Home  Discharge Condition:Stable CODE STATUS:FULL Diet recommendation: Soft diet for next 2-3 days then continue heart healthy diet.  Brief/Interim Summary: Patient is a 48year-old male with history of recurrent pancreatitis of unclear etiology, GERD, hypertension, hyperlipidemia who presented to the emergency room for the evaluation of abdominal pain and emesis. No history of alcohol consumption. Pancreatic divisum wasdemonstrated on prior imaging. He was seen by GI in the past. Presentedwith lipase of 136. CBC showed mild acute pancreatitis. No collection or necrosis. History of prior cholecystectomy,no CBD dilation. Admittedfor management of acutepancreatitis. Started on IV fluids.  His abdominal pain has significantly improved today.  He tolerated soft diet.  Stable for discharge to home.  Following problems were addressed during his hospitalization:  Acute pancreatitis: Presented with abdominal pain, nausea and vomiting.  History of recurrent pancreatitis of unclear etiology.  He has not consumed alcohol for last several months. On his prior imaging, pancreatic divisum was demonstrated.  He was seen by GI in the past and concluded that his recurrent pancreatitis is less likely due to pancreatic divisum as most patients with this are asymptomatic. Lipase of 136 on presentation.  CT showed mild acute pancreatitis.No collection or necrosis.  No pancreatic duct dilation.  He has history of cholecystectomy.  No biliary duct dilation. He is also on Creon at home which we will continue. Abdominal pain has improved. We recommend to follow-up with his gastroenterologist as an outpatient.  Hypertension: Currently normotensive.  Continue current meds  CKD stage 2: Creatinine  at baseline. Outpatient follow up.  Discharge Diagnoses:  Principal Problem:   Acute pancreatitis Active Problems:   Essential hypertension    Discharge Instructions  Discharge Instructions    Diet - low sodium heart healthy   Complete by:  As directed    Discharge instructions   Complete by:  As directed    1)Take soft food for next 2-3 days. 2)Follow up with your gastroenterologist as an outpatient. 3)Follow up with you PCP in a week.   Increase activity slowly   Complete by:  As directed      Allergies as of 07/27/2018      Reactions   Diclofenac Hives   Norvasc [amlodipine Besylate] Other (See Comments)   Fluid buildup in chest   Omeprazole Other (See Comments)   MD stopped due to pancreatitis   Prednisone Other (See Comments)   Mood swings      Medication List    TAKE these medications   famotidine 20 MG tablet Commonly known as:  PEPCID Take 1 tablet (20 mg total) by mouth 2 (two) times daily.   feeding supplement Liqd Take 1 Container by mouth 3 (three) times daily between meals.   lipase/protease/amylase 36000 UNITS Cpep capsule Commonly known as:  Creon Take 1 capsule (36,000 Units total) by mouth See admin instructions. Take one capsules (36,000 units) by mouth with meals (2 meals daily) and one capsule (36000 units) with snacks daily   ondansetron 4 MG disintegrating tablet Commonly known as:  Zofran ODT Take 1 tablet (4 mg total) by mouth every 8 (eight) hours as needed for nausea or vomiting.   promethazine 25 MG tablet Commonly known as:  PHENERGAN Take 1 tablet (25 mg total) by mouth every 6 (six) hours as needed for up to 5 days for nausea or  vomiting.   telmisartan 40 MG tablet Commonly known as:  MICARDIS Take 40 mg by mouth daily.      Follow-up Information    Kerin Perna, NP. Schedule an appointment as soon as possible for a visit in 1 week(s).   Specialty:  Internal Medicine Contact information: [place] [phone] [phone]          Allergies  Allergen Reactions  . Diclofenac Hives  . Norvasc [Amlodipine Besylate] Other (See Comments)    Fluid buildup in chest  . Omeprazole Other (See Comments)    MD stopped due to pancreatitis  . Prednisone Other (See Comments)    Mood swings    Consultations:  Patient seen and examined the bedside this morning.  Remains comfortable.  Hemodynamically stable.  Abdominal pain has improved today.  Tolerated soft diet this morning.  Stable for discharge   Procedures/Studies: Ct Abdomen Pelvis W Contrast  Result Date: 07/23/2018 CLINICAL DATA:  Chronic pancreatitis, follow-up. Left upper quadrant pain EXAM: CT ABDOMEN AND PELVIS WITH CONTRAST TECHNIQUE: Multidetector CT imaging of the abdomen and pelvis was performed using the standard protocol following bolus administration of intravenous contrast. CONTRAST:  17mL OMNIPAQUE IOHEXOL 300 MG/ML  SOLN COMPARISON:  10/27/2017 FINDINGS: Lower chest:  No contributory findings. Hepatobiliary: Borderline hepatic steatosis.Cholecystectomy with no bile duct dilatation Pancreas: Mild peripancreatic edema about the head and tail. No collection or necrosis. No ductal dilatation. Spleen: Unremarkable. Adrenals/Urinary Tract: Negative adrenals. Small right kidney likely from remote vascular insult, with small superimposed cyst. This is known per the chart. No hydronephrosis. Unremarkable bladder. Stomach/Bowel: No obstruction. No appendicitis. Right colonic diverticulosis. Vascular/Lymphatic: No acute vascular abnormality. No mass or adenopathy. Reproductive:No pathologic findings. Other: No ascites or pneumoperitoneum. Musculoskeletal: No acute abnormalities. IMPRESSION: Mild acute pancreatitis. Electronically Signed   By: Monte Fantasia M.D.   On: 07/23/2018 04:39       Subjective:   Discharge Exam: Vitals:   07/26/18 2034 07/27/18 0550  BP: (!) 141/101 132/90  Pulse: 68 64  Resp: 16 16   Temp: 99.3 F (37.4 C) 98.1 F (36.7 C)  SpO2: 98% 98%   Vitals:   07/26/18 0536 07/26/18 1644 07/26/18 2034 07/27/18 0550  BP: (!) 139/95 (!) 144/109 (!) 141/101 132/90  Pulse: 67 74 68 64  Resp: 18  16 16   Temp: 98.1 F (36.7 C) 98.7 F (37.1 C) 99.3 F (37.4 C) 98.1 F (36.7 C)  TempSrc: Oral Oral  Oral  SpO2: 97% 99% 98% 98%  Weight:      Height:        General: Pt is alert, awake, not in acute distress Cardiovascular: RRR, S1/S2 +, no rubs, no gallops Respiratory: CTA bilaterally, no wheezing, no rhonchi Abdominal: Soft, NT, ND, bowel sounds + Extremities: no edema, no cyanosis    The results of significant diagnostics from this hospitalization (including imaging, microbiology, ancillary and laboratory) are listed below for reference.     Microbiology: No results found for this or any previous visit (from the past 240 hour(s)).   Labs: BNP (last 3 results) Recent Labs    01/20/18 2206  BNP 14.4   Basic Metabolic Panel: Recent Labs  Lab 07/23/18 0259 07/24/18 0232 07/27/18 0301  NA 134* 136 138  K 3.6 3.9 3.4*  CL 105 101 103  CO2 19* 26 26  GLUCOSE 118* 118* 102*  BUN 9 <5* <5*  CREATININE 1.27* 1.15 1.26*  CALCIUM 9.2 8.7* 8.8*   Liver  Function Tests: Recent Labs  Lab 07/23/18 0259  AST 32  ALT 26  ALKPHOS 66  BILITOT 0.5  PROT 7.0  ALBUMIN 3.9   Recent Labs  Lab 07/23/18 0259  LIPASE 136*   No results for input(s): AMMONIA in the last 168 hours. CBC: Recent Labs  Lab 07/23/18 0259 07/24/18 0232 07/27/18 0301  WBC 11.5* 8.4 6.2  NEUTROABS  --  6.1 3.0  HGB 13.4 13.0 12.0*  HCT 39.6 38.7* 35.3*  MCV 94.3 94.9 95.9  PLT 196 188 181   Cardiac Enzymes: No results for input(s): CKTOTAL, CKMB, CKMBINDEX, TROPONINI in the last 168 hours. BNP: Invalid input(s): POCBNP CBG: No results for input(s): GLUCAP in the last 168 hours. D-Dimer No results for input(s): DDIMER in the last 72 hours. Hgb A1c No results for input(s):  HGBA1C in the last 72 hours. Lipid Profile No results for input(s): CHOL, HDL, LDLCALC, TRIG, CHOLHDL, LDLDIRECT in the last 72 hours. Thyroid function studies No results for input(s): TSH, T4TOTAL, T3FREE, THYROIDAB in the last 72 hours.  Invalid input(s): FREET3 Anemia work up No results for input(s): VITAMINB12, FOLATE, FERRITIN, TIBC, IRON, RETICCTPCT in the last 72 hours. Urinalysis    Component Value Date/Time   COLORURINE YELLOW 10/27/2017 1152   APPEARANCEUR CLEAR 10/27/2017 1152   LABSPEC 1.017 10/27/2017 1152   PHURINE 5.0 10/27/2017 1152   GLUCOSEU NEGATIVE 10/27/2017 1152   HGBUR LARGE (A) 10/27/2017 1152   BILIRUBINUR NEGATIVE 10/27/2017 1152   KETONESUR NEGATIVE 10/27/2017 1152   PROTEINUR NEGATIVE 10/27/2017 1152   UROBILINOGEN 0.2 01/20/2015 1643   NITRITE NEGATIVE 10/27/2017 1152   LEUKOCYTESUR NEGATIVE 10/27/2017 1152   Sepsis Labs Invalid input(s): PROCALCITONIN,  WBC,  LACTICIDVEN Microbiology No results found for this or any previous visit (from the past 240 hour(s)).  Please note: You were cared for by a hospitalist during your hospital stay. Once you are discharged, your primary care physician will handle any further medical issues. Please note that NO REFILLS for any discharge medications will be authorized once you are discharged, as it is imperative that you return to your primary care physician (or establish a relationship with a primary care physician if you do not have one) for your post hospital discharge needs so that they can reassess your need for medications and monitor your lab values.    Time coordinating discharge: 40 minutes  SIGNED:   Shelly Coss, MD  [facility] 07/27/2018, 11:09 AM Pager [phone]  If 7PM-7AM, please contact night-coverage [email or web address] Password [email or web address]

## 2018-07-27 NOTE — Progress Notes (Signed)
[name] to be D/C'd Home per MD order.  Discussed with the patient and all questions fully answered.  VSS, Skin clean, dry and intact without evidence of skin break down, no evidence of skin tears noted. IV catheter discontinued intact. Site without signs and symptoms of complications. Dressing and pressure applied.  An After Visit Summary was printed and given to the patient. Patient received prescription.  D/c education completed with patient/family including follow up instructions, medication list, d/c activities limitations if indicated, with other d/c instructions as indicated by MD - patient able to verbalize understanding, all questions fully answered.   Patient instructed to return to ED, call 911, or call MD for any changes in condition.   Patient escorted via [place], and D/C home via private auto.  Luci Bank 07/27/2018 11:40 AM

## 2018-10-16 ENCOUNTER — Encounter (HOSPITAL_COMMUNITY): Payer: Self-pay | Admitting: *Deleted

## 2018-10-16 ENCOUNTER — Other Ambulatory Visit: Payer: Self-pay

## 2018-10-16 ENCOUNTER — Inpatient Hospital Stay (HOSPITAL_COMMUNITY)
Admission: EM | Admit: 2018-10-16 | Discharge: 2018-10-24 | DRG: 439 | Disposition: A | Discharge status: Home / Self Care | Payer: Managed Care, Other (non HMO) | Attending: Internal Medicine | Admitting: Internal Medicine

## 2018-10-16 DIAGNOSIS — Z841 Family history of disorders of kidney and ureter: Secondary | ICD-10-CM

## 2018-10-16 DIAGNOSIS — N200 Calculus of kidney: Secondary | ICD-10-CM

## 2018-10-16 DIAGNOSIS — K861 Other chronic pancreatitis: Secondary | ICD-10-CM | POA: Diagnosis present

## 2018-10-16 DIAGNOSIS — G8929 Other chronic pain: Secondary | ICD-10-CM | POA: Diagnosis present

## 2018-10-16 DIAGNOSIS — Z833 Family history of diabetes mellitus: Secondary | ICD-10-CM

## 2018-10-16 DIAGNOSIS — Z1159 Encounter for screening for other viral diseases: Secondary | ICD-10-CM

## 2018-10-16 DIAGNOSIS — E876 Hypokalemia: Secondary | ICD-10-CM

## 2018-10-16 DIAGNOSIS — Z9049 Acquired absence of other specified parts of digestive tract: Secondary | ICD-10-CM

## 2018-10-16 DIAGNOSIS — Q453 Other congenital malformations of pancreas and pancreatic duct: Secondary | ICD-10-CM

## 2018-10-16 DIAGNOSIS — Z888 Allergy status to other drugs, medicaments and biological substances status: Secondary | ICD-10-CM

## 2018-10-16 DIAGNOSIS — I10 Essential (primary) hypertension: Secondary | ICD-10-CM | POA: Diagnosis present

## 2018-10-16 DIAGNOSIS — K859 Acute pancreatitis without necrosis or infection, unspecified: Secondary | ICD-10-CM | POA: Diagnosis not present

## 2018-10-16 DIAGNOSIS — R1013 Epigastric pain: Secondary | ICD-10-CM | POA: Diagnosis not present

## 2018-10-16 DIAGNOSIS — Q600 Renal agenesis, unilateral: Secondary | ICD-10-CM

## 2018-10-16 DIAGNOSIS — E785 Hyperlipidemia, unspecified: Secondary | ICD-10-CM | POA: Diagnosis present

## 2018-10-16 DIAGNOSIS — Z87442 Personal history of urinary calculi: Secondary | ICD-10-CM

## 2018-10-16 DIAGNOSIS — K219 Gastro-esophageal reflux disease without esophagitis: Secondary | ICD-10-CM | POA: Diagnosis present

## 2018-10-16 DIAGNOSIS — Z79899 Other long term (current) drug therapy: Secondary | ICD-10-CM

## 2018-10-16 DIAGNOSIS — E781 Pure hyperglyceridemia: Secondary | ICD-10-CM | POA: Diagnosis present

## 2018-10-16 DIAGNOSIS — R3129 Other microscopic hematuria: Secondary | ICD-10-CM

## 2018-10-16 DIAGNOSIS — Z8249 Family history of ischemic heart disease and other diseases of the circulatory system: Secondary | ICD-10-CM

## 2018-10-16 LAB — CBC
HCT: 43.2 % (ref 39.0–52.0)
Hemoglobin: 14.8 g/dL (ref 13.0–17.0)
MCH: 32.8 pg (ref 26.0–34.0)
MCHC: 34.3 g/dL (ref 30.0–36.0)
MCV: 95.8 fL (ref 80.0–100.0)
Platelets: 201 10*3/uL (ref 150–400)
RBC: 4.51 MIL/uL (ref 4.22–5.81)
RDW: 13.8 % (ref 11.5–15.5)
WBC: 9.4 10*3/uL (ref 4.0–10.5)
nRBC: 0 % (ref 0.0–0.2)

## 2018-10-16 LAB — LIPASE, BLOOD: Lipase: 187 U/L — ABNORMAL HIGH (ref 11–51)

## 2018-10-16 LAB — COMPREHENSIVE METABOLIC PANEL
ALT: 37 U/L (ref 0–44)
AST: 63 U/L — ABNORMAL HIGH (ref 15–41)
Albumin: 3.9 g/dL (ref 3.5–5.0)
Alkaline Phosphatase: 70 U/L (ref 38–126)
Anion gap: 13 (ref 5–15)
BUN: 7 mg/dL (ref 6–20)
CO2: 21 mmol/L — ABNORMAL LOW (ref 22–32)
Calcium: 9.3 mg/dL (ref 8.9–10.3)
Chloride: 103 mmol/L (ref 98–111)
Creatinine, Ser: 1.31 mg/dL — ABNORMAL HIGH (ref 0.61–1.24)
GFR calc Af Amer: >60 mL/min (ref >60)
GFR calc non Af Amer: >60 mL/min (ref >60)
Glucose, Bld: 121 mg/dL — ABNORMAL HIGH (ref 70–99)
Potassium: 3.4 mmol/L — ABNORMAL LOW (ref 3.5–5.1)
Sodium: 137 mmol/L (ref 135–145)
Total Bilirubin: 1.7 mg/dL — ABNORMAL HIGH (ref 0.3–1.2)
Total Protein: 7.4 g/dL (ref 6.5–8.1)

## 2018-10-16 MED ORDER — SODIUM CHLORIDE 0.9% FLUSH
3.0000 mL | Freq: Once | INTRAVENOUS | Status: AC
  Administered 2018-10-17: 3 mL via INTRAVENOUS

## 2018-10-16 NOTE — ED Triage Notes (Signed)
Pt reports abdominal pain, nausea/vomiting for about 1 week, consistent with his pancreatitis pain. Pt says he also has had blood in his urine today.

## 2018-10-17 ENCOUNTER — Other Ambulatory Visit: Payer: Self-pay

## 2018-10-17 DIAGNOSIS — K859 Acute pancreatitis without necrosis or infection, unspecified: Principal | ICD-10-CM

## 2018-10-17 DIAGNOSIS — Z87442 Personal history of urinary calculi: Secondary | ICD-10-CM | POA: Diagnosis not present

## 2018-10-17 DIAGNOSIS — E876 Hypokalemia: Secondary | ICD-10-CM | POA: Diagnosis present

## 2018-10-17 DIAGNOSIS — K861 Other chronic pancreatitis: Secondary | ICD-10-CM | POA: Diagnosis present

## 2018-10-17 DIAGNOSIS — Z841 Family history of disorders of kidney and ureter: Secondary | ICD-10-CM | POA: Diagnosis not present

## 2018-10-17 DIAGNOSIS — K219 Gastro-esophageal reflux disease without esophagitis: Secondary | ICD-10-CM | POA: Diagnosis present

## 2018-10-17 DIAGNOSIS — I10 Essential (primary) hypertension: Secondary | ICD-10-CM | POA: Diagnosis present

## 2018-10-17 DIAGNOSIS — Z9049 Acquired absence of other specified parts of digestive tract: Secondary | ICD-10-CM | POA: Diagnosis not present

## 2018-10-17 DIAGNOSIS — Z1159 Encounter for screening for other viral diseases: Secondary | ICD-10-CM | POA: Diagnosis not present

## 2018-10-17 DIAGNOSIS — Q600 Renal agenesis, unilateral: Secondary | ICD-10-CM | POA: Diagnosis not present

## 2018-10-17 DIAGNOSIS — N200 Calculus of kidney: Secondary | ICD-10-CM | POA: Diagnosis present

## 2018-10-17 DIAGNOSIS — Z833 Family history of diabetes mellitus: Secondary | ICD-10-CM | POA: Diagnosis not present

## 2018-10-17 DIAGNOSIS — E785 Hyperlipidemia, unspecified: Secondary | ICD-10-CM | POA: Diagnosis present

## 2018-10-17 DIAGNOSIS — E781 Pure hyperglyceridemia: Secondary | ICD-10-CM | POA: Diagnosis present

## 2018-10-17 DIAGNOSIS — R3129 Other microscopic hematuria: Secondary | ICD-10-CM

## 2018-10-17 DIAGNOSIS — Q453 Other congenital malformations of pancreas and pancreatic duct: Secondary | ICD-10-CM | POA: Diagnosis not present

## 2018-10-17 DIAGNOSIS — Z888 Allergy status to other drugs, medicaments and biological substances status: Secondary | ICD-10-CM | POA: Diagnosis not present

## 2018-10-17 DIAGNOSIS — R1013 Epigastric pain: Secondary | ICD-10-CM | POA: Diagnosis present

## 2018-10-17 DIAGNOSIS — G8929 Other chronic pain: Secondary | ICD-10-CM | POA: Diagnosis present

## 2018-10-17 DIAGNOSIS — Z79899 Other long term (current) drug therapy: Secondary | ICD-10-CM | POA: Diagnosis not present

## 2018-10-17 DIAGNOSIS — Z8249 Family history of ischemic heart disease and other diseases of the circulatory system: Secondary | ICD-10-CM | POA: Diagnosis not present

## 2018-10-17 LAB — URINALYSIS, ROUTINE W REFLEX MICROSCOPIC
Bacteria, UA: NONE SEEN
Glucose, UA: NEGATIVE mg/dL
Ketones, ur: 20 mg/dL — AB
Leukocytes,Ua: NEGATIVE
Nitrite: NEGATIVE
Protein, ur: 100 mg/dL — AB
RBC / HPF: >50 RBC/hpf — ABNORMAL HIGH (ref 0–5)
Specific Gravity, Urine: 1.032 — ABNORMAL HIGH (ref 1.005–1.030)
pH: 5 (ref 5.0–8.0)

## 2018-10-17 LAB — COMPREHENSIVE METABOLIC PANEL
ALT: 34 U/L (ref 0–44)
AST: 57 U/L — ABNORMAL HIGH (ref 15–41)
Albumin: 3.6 g/dL (ref 3.5–5.0)
Alkaline Phosphatase: 67 U/L (ref 38–126)
Anion gap: 11 (ref 5–15)
BUN: 5 mg/dL — ABNORMAL LOW (ref 6–20)
CO2: 23 mmol/L (ref 22–32)
Calcium: 9.1 mg/dL (ref 8.9–10.3)
Chloride: 104 mmol/L (ref 98–111)
Creatinine, Ser: 1.17 mg/dL (ref 0.61–1.24)
GFR calc Af Amer: >60 mL/min (ref >60)
GFR calc non Af Amer: >60 mL/min (ref >60)
Glucose, Bld: 138 mg/dL — ABNORMAL HIGH (ref 70–99)
Potassium: 3.6 mmol/L (ref 3.5–5.1)
Sodium: 138 mmol/L (ref 135–145)
Total Bilirubin: 1.9 mg/dL — ABNORMAL HIGH (ref 0.3–1.2)
Total Protein: 6.9 g/dL (ref 6.5–8.1)

## 2018-10-17 LAB — MAGNESIUM: Magnesium: 1.9 mg/dL (ref 1.7–2.4)

## 2018-10-17 MED ORDER — PROMETHAZINE HCL 25 MG/ML IJ SOLN
25.0000 mg | Freq: Once | INTRAMUSCULAR | Status: AC
  Administered 2018-10-17: 25 mg via INTRAVENOUS
  Filled 2018-10-17: qty 1

## 2018-10-17 MED ORDER — HYDROMORPHONE HCL 1 MG/ML IJ SOLN
1.0000 mg | INTRAMUSCULAR | Status: DC | PRN
  Administered 2018-10-17 – 2018-10-22 (×44): 1 mg via INTRAVENOUS
  Filled 2018-10-17 (×44): qty 1

## 2018-10-17 MED ORDER — HYDRALAZINE HCL 20 MG/ML IJ SOLN: 5.0000 mg | INTRAMUSCULAR | Status: DC | PRN

## 2018-10-17 MED ORDER — PANCRELIPASE (LIP-PROT-AMYL) 36000-114000 UNITS PO CPEP
36000.0000 [IU] | ORAL_CAPSULE | Freq: Every day | ORAL | Status: DC | PRN
  Filled 2018-10-17: qty 1

## 2018-10-17 MED ORDER — HYDRALAZINE HCL 20 MG/ML IJ SOLN
10.0000 mg | INTRAMUSCULAR | Status: DC | PRN
  Administered 2018-10-17: 10 mg via INTRAVENOUS
  Filled 2018-10-17: qty 1

## 2018-10-17 MED ORDER — ACETAMINOPHEN 650 MG RE SUPP: 650.0000 mg | Freq: Four times a day (QID) | RECTAL | Status: DC | PRN

## 2018-10-17 MED ORDER — HYDROMORPHONE HCL 1 MG/ML IJ SOLN
1.0000 mg | INTRAMUSCULAR | Status: DC | PRN
  Administered 2018-10-17: 1 mg via INTRAVENOUS
  Filled 2018-10-17: qty 1

## 2018-10-17 MED ORDER — PROMETHAZINE HCL 25 MG/ML IJ SOLN
25.0000 mg | Freq: Four times a day (QID) | INTRAMUSCULAR | Status: DC | PRN
  Administered 2018-10-17 – 2018-10-24 (×18): 25 mg via INTRAVENOUS
  Filled 2018-10-17 (×18): qty 1

## 2018-10-17 MED ORDER — ONDANSETRON HCL 4 MG/2ML IJ SOLN
4.0000 mg | Freq: Four times a day (QID) | INTRAMUSCULAR | Status: DC | PRN
  Administered 2018-10-17 – 2018-10-24 (×4): 4 mg via INTRAVENOUS
  Filled 2018-10-17 (×6): qty 2

## 2018-10-17 MED ORDER — HYDROMORPHONE HCL 1 MG/ML IJ SOLN
1.0000 mg | Freq: Once | INTRAMUSCULAR | Status: AC
  Administered 2018-10-17: 1 mg via INTRAVENOUS
  Filled 2018-10-17: qty 1

## 2018-10-17 MED ORDER — POTASSIUM CHLORIDE CRYS ER 20 MEQ PO TBCR
40.0000 meq | EXTENDED_RELEASE_TABLET | Freq: Once | ORAL | Status: DC
  Filled 2018-10-17: qty 2

## 2018-10-17 MED ORDER — SODIUM CHLORIDE 0.9 % IV SOLN
INTRAVENOUS | Status: DC
  Administered 2018-10-17: 07:00:00 via INTRAVENOUS

## 2018-10-17 MED ORDER — KCL IN DEXTROSE-NACL 10-5-0.45 MEQ/L-%-% IV SOLN
INTRAVENOUS | Status: DC
  Administered 2018-10-17: 12:00:00 via INTRAVENOUS
  Administered 2018-10-17: 125 mL via INTRAVENOUS
  Administered 2018-10-18: 125 mL/h via INTRAVENOUS
  Administered 2018-10-18 – 2018-10-19 (×3): via INTRAVENOUS
  Administered 2018-10-19 – 2018-10-20 (×2): 125 mL/h via INTRAVENOUS
  Administered 2018-10-20 – 2018-10-24 (×10): via INTRAVENOUS
  Filled 2018-10-17 (×21): qty 1000

## 2018-10-17 MED ORDER — ACETAMINOPHEN 325 MG PO TABS
650.0000 mg | ORAL_TABLET | Freq: Four times a day (QID) | ORAL | Status: DC | PRN
  Administered 2018-10-22 – 2018-10-23 (×3): 650 mg via ORAL
  Filled 2018-10-17 (×4): qty 2

## 2018-10-17 MED ORDER — PANCRELIPASE (LIP-PROT-AMYL) 36000-114000 UNITS PO CPEP
36000.0000 [IU] | ORAL_CAPSULE | Freq: Two times a day (BID) | ORAL | Status: DC
  Administered 2018-10-18 – 2018-10-24 (×13): 36000 [IU] via ORAL
  Filled 2018-10-17 (×16): qty 1

## 2018-10-17 NOTE — Plan of Care (Signed)

## 2018-10-17 NOTE — Plan of Care (Signed)
  Problem: Nutrition: Goal: Adequate nutrition will be maintained Outcome: Progressing   Problem: Pain Managment: Goal: General experience of comfort will improve Outcome: Progressing   

## 2018-10-17 NOTE — ED Provider Notes (Signed)
[facility] Provider Note   CSN: [id number] Arrival date & time: 10/16/18  2211     History   Chief Complaint No chief complaint on file.   HPI Kyle Berry is a 48 y.o. male.     HPI Patient presents to the emergency department with a flareup of his pancreatitis.  The patient states that he started noticing several days ago some irritation and pain in his abdomen but then it got worse last night.  The patient states that he began to have vomiting as well.  Patient states he took his home medicines without any relief of his symptoms.  The patient denies chest pain, shortness of breath, headache,blurred vision, neck pain, fever, cough, weakness, numbness, dizziness, anorexia, edema, diarrhea, rash, back pain, dysuria, hematemesis, bloody stool, near syncope, or syncope. Past Medical History:  Diagnosis Date  . Acute pancreatitis 11/23/2015  . Chronic bronchitis ([place])   . Chronic lower back pain   . Congenital single kidney   . GERD (gastroesophageal reflux disease)   . Headache    "once/month" (05/13/2017)  . High cholesterol   . History of kidney stones   . Hypertension   . Migraine    "a couple/year" (05/13/2017)  . Nephrolithiasis 05/13/2017  . Pneumonia ~ 09/2015  . Recurrent acute pancreatitis   . Renal disorder     Patient Active Problem List   Diagnosis Date Noted  . Recurrent acute pancreatitis 10/17/2018  . Microscopic hematuria 10/17/2018  . Acute pancreatitis 07/23/2018  . Pancreatic divisum   . Congenital single kidney 05/13/2017  . High cholesterol 05/13/2017  . Nephrolithiasis 05/13/2017  . Acute recurrent pancreatitis   . Hypokalemia   . Essential hypertension   . Esophageal reflux   . Chronic pancreatitis ([place]) 11/23/2015    Past Surgical History:  Procedure Laterality Date  . CHOLECYSTECTOMY N/A 10/23/2017   Procedure: LAPAROSCOPIC CHOLECYSTECTOMY WITH INTRAOPERATIVE CHOLANGIOGRAM;  Surgeon: Johnathan Hausen, MD;  Location: [facility];  Service: General;  Laterality: N/A;  . NO PAST SURGERIES          Home Medications    Prior to Admission medications   Medication Sig Start Date End Date Taking? Authorizing Provider  lipase/protease/amylase (CREON) 36000 UNITS CPEP capsule Take 1 capsule (36,000 Units total) by mouth See admin instructions. Take one capsules (36,000 units) by mouth with meals (2 meals daily) and one capsule (36000 units) with snacks daily 10/14/17  Yes Emokpae, [name], MD  methocarbamol (ROBAXIN) 500 MG tablet Take 1,000 mg by mouth 3 (three) times daily as needed for pain. 09/29/18  Yes [provider]  promethazine (PHENERGAN) 25 MG tablet Take 1 tablet (25 mg total) by mouth every 6 (six) hours as needed for up to 5 days for nausea or vomiting. 04/13/18 07/23/27 Yes Carroll Sage, MD  telmisartan (MICARDIS) 40 MG tablet Take 40 mg by mouth daily. 01/02/18  Yes [provider]  famotidine (PEPCID) 20 MG tablet Take 1 tablet (20 mg total) by mouth 2 (two) times daily. Patient not taking: Reported on 01/20/2018 10/14/17   Roxan Hockey, MD  feeding supplement (BOOST / RESOURCE BREEZE) LIQD Take 1 Container by mouth 3 (three) times daily between meals. Patient not taking: Reported on 01/20/2018 01/20/17   Cristal Ford, DO  ondansetron (ZOFRAN ODT) 4 MG disintegrating tablet Take 1 tablet (4 mg total) by mouth every 8 (eight) hours as needed for nausea or vomiting. Patient not taking: Reported on 07/23/2018 04/06/18  Deno Etienne, DO    Family History Family History  Problem Relation Age of Onset  . Hypertension Other   . Hypertension Mother   . Hypertension Father   . Kidney disease Father   . Hypertension Sister   . Diabetes Sister   . Hypertension Brother     Social History Social History   Tobacco Use  . Smoking status: Never Smoker  . Smokeless tobacco: Never Used  Substance Use Topics  . Alcohol use: Not Currently    Comment: 05/13/2017  "might have beer a few times/year"  . Drug use: No     Allergies   Diclofenac, Norvasc [amlodipine besylate], Omeprazole, and Prednisone   Review of Systems Review of Systems All other systems negative except as documented in the HPI. All pertinent positives and negatives as reviewed in the HPI.  Physical Exam Updated Vital Signs BP (!) 141/93   Pulse 69   Temp 97.7 F (36.5 C) (Oral)   Resp 18   SpO2 97%   Physical Exam Vitals signs and nursing note reviewed.  Constitutional:      General: He is not in acute distress.    Appearance: He is well-developed.  HENT:     Head: Normocephalic and atraumatic.  Eyes:     Pupils: Pupils are equal, round, and reactive to light.  Neck:     Musculoskeletal: Normal range of motion and neck supple.  Cardiovascular:     Rate and Rhythm: Normal rate and regular rhythm.     Heart sounds: Normal heart sounds. No murmur. No friction rub. No gallop.   Pulmonary:     Effort: Pulmonary effort is normal. No respiratory distress.     Breath sounds: Normal breath sounds. No wheezing.  Abdominal:     General: Bowel sounds are normal. There is no distension.     Palpations: Abdomen is soft.     Tenderness: There is abdominal tenderness. There is no guarding or rebound.     Hernia: No hernia is present.  Skin:    General: Skin is warm and dry.     Capillary Refill: Capillary refill takes less than 2 seconds.     Findings: No erythema or rash.  Neurological:     Mental Status: He is alert and oriented to person, place, and time.     Motor: No abnormal muscle tone.     Coordination: Coordination normal.  Psychiatric:        Behavior: Behavior normal.      ED Treatments / Results  Labs (all labs ordered are listed, but only abnormal results are displayed) Labs Reviewed  LIPASE, BLOOD - Abnormal; Notable for the following components:      Result Value   Lipase 187 (*)    All other components within normal limits  COMPREHENSIVE  METABOLIC PANEL - Abnormal; Notable for the following components:   Potassium 3.4 (*)    CO2 21 (*)    Glucose, Bld 121 (*)    Creatinine, Ser 1.31 (*)    AST 63 (*)    Total Bilirubin 1.7 (*)    All other components within normal limits  URINALYSIS, ROUTINE W REFLEX MICROSCOPIC - Abnormal; Notable for the following components:   Color, Urine AMBER (*)    Specific Gravity, Urine 1.032 (*)    Hgb urine dipstick MODERATE (*)    Bilirubin Urine SMALL (*)    Ketones, ur 20 (*)    Protein, ur 100 (*)    RBC /  HPF >50 (*)    All other components within normal limits  NOVEL CORONAVIRUS, NAA (HOSPITAL ORDER, SEND-OUT TO REF LAB)  CBC  MAGNESIUM  COMPREHENSIVE METABOLIC PANEL    EKG EKG Interpretation  Date/Time:  Thursday October 16 2018 22:56:25 EDT Ventricular Rate:  109 PR Interval:  142 QRS Duration: 74 QT Interval:  322 QTC Calculation: 433 R Axis:   48 Text Interpretation:  Sinus tachycardia Possible Left atrial enlargement Left ventricular hypertrophy Early repolarization Abnormal ECG Interpretation limited secondary to artifact Confirmed by Ripley Fraise [phone]) on 10/17/2018 3:17:25 AM   Radiology No results found.  Procedures Procedures (including critical care time)  Medications Ordered in ED Medications  sodium chloride flush (NS) 0.9 % injection 3 mL (has no administration in time range)  lipase/protease/amylase (CREON) capsule 36,000 Units (has no administration in time range)  acetaminophen (TYLENOL) tablet 650 mg (has no administration in time range)    Or  acetaminophen (TYLENOL) suppository 650 mg (has no administration in time range)  potassium chloride SA (K-DUR) CR tablet 40 mEq (has no administration in time range)  HYDROmorphone (DILAUDID) injection 1 mg (has no administration in time range)  0.9 %  sodium chloride infusion (has no administration in time range)  promethazine (PHENERGAN) injection 25 mg (has no administration in time range)   lipase/protease/amylase (CREON) capsule 36,000 Units (has no administration in time range)  hydrALAZINE (APRESOLINE) injection 5 mg (has no administration in time range)  HYDROmorphone (DILAUDID) injection 1 mg (1 mg Intravenous Given 10/17/18 0301)  promethazine (PHENERGAN) injection 25 mg (25 mg Intravenous Given 10/17/18 0255)     Initial Impression / Assessment and Plan / ED Course  I have reviewed the triage vital signs and the nursing notes.  Pertinent labs & imaging results that were available during my care of the patient were reviewed by me and considered in my medical decision making (see chart for details).        She did not get significant relief of his symptoms with the antiemetics and pain control.  We will have him admitted to the hospital for further evaluation care of his pancreatitis.  Imaging was not performed due to the fact the patient states that this feels similar to his previous episodes of pancreatitis.  With no complicating factors at this point. Final Clinical Impressions(s) / ED Diagnoses   Final diagnoses:  None    ED Discharge Orders    None       Dalia Heading, PA-C 10/17/18 0645    Ripley Fraise, MD 10/17/18 [phone]

## 2018-10-17 NOTE — Progress Notes (Signed)
   Patient seen and evaluated, chart reviewed, please see EMR for updated orders. Please see full H&P dictated by admitting physician Dr. Marlowe Sax for same date of service.    Brief summary history significant of recurrent pancreatitis of unclear etiology, GERD, hypertension, mild hypertriglyceridemia admitted on 10/17/2018 with another episode of acute pancreatitis--  A/p 1)--recurrent pancreatitis of unclear etiology- Lipase is 187 .Marland Kitchen Which is higher than recent levels--   Pancreas divisum demonstrated on prior imaging.  Patient has been seen by GI previously and his recurrent pancreatitis is thought to be less likely related to pancreatic divisum as most patients with this are asymptomatic. T bil currently 1.9, AST 57 and ALT 34 -No alcohol use, triglycerides previously in the 250 to 350 range -- status post prior cholecystectomy --Continue IV fluids, clear liquid diet, Creon, continue PRN opiates and antiemetics   2)-HTN--continue to hold micardis,  may use IV Hydralazine 10 mg  Every 4 hours Prn for elevated BP    Patient seen and evaluated, chart reviewed, please see EMR for updated orders. Please see full H&P dictated by admitting physician Dr. Marlowe Sax for same date of service.

## 2018-10-17 NOTE — ED Notes (Signed)
Attempted to call report

## 2018-10-17 NOTE — ED Provider Notes (Signed)
Patient seen/examined in the Emergency Department in conjunction with Midlevel Provider [place] Patient reports epigastric pain, radiates to back, similar to prior pancreatitis Exam : awake/alert, focal epigastric tenderness Plan: admit for intractable pain with nausea/vomiting    Ripley Fraise, MD 10/17/18 [phone]

## 2018-10-17 NOTE — ED Notes (Signed)
ED Provider at bedside. 

## 2018-10-17 NOTE — H&P (Signed)
History and Physical    Kyle Berry:[id number] DOB: May 23, 1970 DOA: 10/16/2018  PCP: [name] Patient coming from: Home  Chief Complaint: Abdominal pain, vomiting, blood in urine  HPI: Kyle Berry is a 48 y.o. male with medical history significant of recurrent pancreatitis of unclear etiology, chronic bronchitis, chronic lower back pain, congenital single kidney, nephrolithiasis, GERD, history of  cholecystectomy, hyperlipidemia, hypertension presenting to the hospital for evaluation of abdominal pain, vomiting, and blood in urine.  Patient reports 2-day history of severe epigastric abdominal pain which radiates to his back, nausea, and vomiting.  States this is similar to his prior pancreatitis episodes.  He has not been able to tolerate p.o. intake.  No fevers or chills.  States he has not used alcohol for the past 1 year.  States he has previously had blood in his urine and today noticed that his urine was darker than yellow in color but not grossly bloody.  He thinks it may have had blood in it.  States he just has 1 kidney and has been told there is a stone in it.  He is seen by a kidney specialist at [facility].  He does not take any blood thinners or aspirin at home.  ED Course: Afebrile.  Not tachycardic or hypotensive.  No leukocytosis.  Potassium 3.4.  Lipase 187. AST 63 and T bili 1.7.  ALT and alk phos normal.  LFTs were normal 2 months ago.  Urine not grossly bloody.  UA with moderate amount of hemoglobin on dipstick and greater than 50 RBCs on microscopic examination.  No signs of urine infection.  Hemoglobin stable at 13.0.  Patient received Phenergan and Dilaudid in the ED. Continued to have persistent nausea and vomiting and was not able to tolerate p.o. intake.  Review of Systems:  All systems reviewed and apart from history of presenting illness, are negative.  Past Medical History:  Diagnosis Date  . Acute pancreatitis 11/23/2015  . Chronic  bronchitis ([place])   . Chronic lower back pain   . Congenital single kidney   . GERD (gastroesophageal reflux disease)   . Headache    "once/month" (05/13/2017)  . High cholesterol   . History of kidney stones   . Hypertension   . Migraine    "a couple/year" (05/13/2017)  . Nephrolithiasis 05/13/2017  . Pneumonia ~ 09/2015  . Recurrent acute pancreatitis   . Renal disorder     Past Surgical History:  Procedure Laterality Date  . CHOLECYSTECTOMY N/A 10/23/2017   Procedure: LAPAROSCOPIC CHOLECYSTECTOMY WITH INTRAOPERATIVE CHOLANGIOGRAM;  Surgeon: Johnathan Hausen, MD;  Location: [facility];  Service: General;  Laterality: N/A;  . NO PAST SURGERIES       reports that he has never smoked. He has never used smokeless tobacco. He reports previous alcohol use. He reports that he does not use drugs.  Allergies  Allergen Reactions  . Diclofenac Hives  . Norvasc [Amlodipine Besylate] Other (See Comments)    Fluid buildup in chest  . Omeprazole Other (See Comments)    MD stopped due to pancreatitis  . Prednisone Other (See Comments)    Mood swings    Family History  Problem Relation Age of Onset  . Hypertension Other   . Hypertension Mother   . Hypertension Father   . Kidney disease Father   . Hypertension Sister   . Diabetes Sister   . Hypertension Brother     Prior to Admission medications   Medication Sig Start Date End  Date Taking? Authorizing Provider  lipase/protease/amylase (CREON) 36000 UNITS CPEP capsule Take 1 capsule (36,000 Units total) by mouth See admin instructions. Take one capsules (36,000 units) by mouth with meals (2 meals daily) and one capsule (36000 units) with snacks daily 10/14/17  Yes Emokpae, [name], MD  methocarbamol (ROBAXIN) 500 MG tablet Take 1,000 mg by mouth 3 (three) times daily as needed for pain. 09/29/18  Yes [provider]  promethazine (PHENERGAN) 25 MG tablet Take 1 tablet (25 mg total) by mouth every 6 (six) hours as needed for up to 5 days  for nausea or vomiting. 04/13/18 07/23/27 Yes Carroll Sage, MD  telmisartan (MICARDIS) 40 MG tablet Take 40 mg by mouth daily. 01/02/18  Yes [provider]  famotidine (PEPCID) 20 MG tablet Take 1 tablet (20 mg total) by mouth 2 (two) times daily. Patient not taking: Reported on 01/20/2018 10/14/17   Roxan Hockey, MD  feeding supplement (BOOST / RESOURCE BREEZE) LIQD Take 1 Container by mouth 3 (three) times daily between meals. Patient not taking: Reported on 01/20/2018 01/20/17   Cristal Ford, DO  ondansetron (ZOFRAN ODT) 4 MG disintegrating tablet Take 1 tablet (4 mg total) by mouth every 8 (eight) hours as needed for nausea or vomiting. Patient not taking: Reported on 07/23/2018 04/06/18   Deno Etienne, DO    Physical Exam: Vitals:   10/17/18 0230 10/17/18 0330 10/17/18 0400 10/17/18 0445  BP: (!) 149/97 (!) 136/98 (!) 145/96 (!) 141/93  Pulse: 69 78 69 69  Resp:      Temp:      TempSrc:      SpO2: 99% 96% 97% 97%    Physical Exam  Constitutional: He is oriented to person, place, and time. He appears well-developed and well-nourished. No distress.  Resting comfortably, watching television  HENT:  Head: Normocephalic.  Dry mucous membranes  Eyes: Right eye exhibits no discharge. Left eye exhibits no discharge.  Neck: Neck supple.  Cardiovascular: Normal rate, regular rhythm and intact distal pulses.  Pulmonary/Chest: Effort normal and breath sounds normal. No respiratory distress. He has no wheezes. He has no rales.  Abdominal: Soft. Bowel sounds are normal. He exhibits no distension. There is abdominal tenderness. There is no rebound and no guarding.  Epigastrium tender to palpation  Musculoskeletal:        General: No edema.  Neurological: He is alert and oriented to person, place, and time.  Skin: Skin is warm and dry. He is not diaphoretic.     Labs on Admission: I have personally reviewed following labs and imaging studies  CBC: Recent Labs  Lab 10/16/18  2254  WBC 9.4  HGB 14.8  HCT 43.2  MCV 95.8  PLT 096   Basic Metabolic Panel: Recent Labs  Lab 10/16/18 2254  NA 137  K 3.4*  CL 103  CO2 21*  GLUCOSE 121*  BUN 7  CREATININE 1.31*  CALCIUM 9.3   GFR: CrCl cannot be calculated (Unknown ideal weight.). Liver Function Tests: Recent Labs  Lab 10/16/18 2254  AST 63*  ALT 37  ALKPHOS 70  BILITOT 1.7*  PROT 7.4  ALBUMIN 3.9   Recent Labs  Lab 10/16/18 2254  LIPASE 187*   No results for input(s): AMMONIA in the last 168 hours. Coagulation Profile: No results for input(s): INR, PROTIME in the last 168 hours. Cardiac Enzymes: No results for input(s): CKTOTAL, CKMB, CKMBINDEX, TROPONINI in the last 168 hours. BNP (last 3 results) No results for input(s): PROBNP in the  last 8760 hours. HbA1C: No results for input(s): HGBA1C in the last 72 hours. CBG: No results for input(s): GLUCAP in the last 168 hours. Lipid Profile: No results for input(s): CHOL, HDL, LDLCALC, TRIG, CHOLHDL, LDLDIRECT in the last 72 hours. Thyroid Function Tests: No results for input(s): TSH, T4TOTAL, FREET4, T3FREE, THYROIDAB in the last 72 hours. Anemia Panel: No results for input(s): VITAMINB12, FOLATE, FERRITIN, TIBC, IRON, RETICCTPCT in the last 72 hours. Urine analysis:    Component Value Date/Time   COLORURINE AMBER (A) 10/16/2018 0040   APPEARANCEUR CLEAR 10/16/2018 0040   LABSPEC 1.032 (H) 10/16/2018 0040   PHURINE 5.0 10/16/2018 0040   GLUCOSEU NEGATIVE 10/16/2018 0040   HGBUR MODERATE (A) 10/16/2018 0040   BILIRUBINUR SMALL (A) 10/16/2018 0040   KETONESUR 20 (A) 10/16/2018 0040   PROTEINUR 100 (A) 10/16/2018 0040   UROBILINOGEN 0.2 01/20/2015 1643   NITRITE NEGATIVE 10/16/2018 0040   LEUKOCYTESUR NEGATIVE 10/16/2018 0040    Radiological Exams on Admission: No results found.  EKG: Independently reviewed.  Sinus tachycardia (heart rate 109), LVH.  Assessment/Plan Principal Problem:   Recurrent acute pancreatitis Active  Problems:   Essential hypertension   Hypokalemia   Nephrolithiasis   Microscopic hematuria   Recurrent acute pancreatitis Unclear etiology.    History of prior cholecystectomy.  Patient states he has not consumed alcohol for the past 1 year.  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    Followed by GI and his recurrent pancreatitis is thought to be less likely related to pancreatic divisum as most patients with this are asymptomatic.  Afebrile and no leukocytosis.  No signs of sepsis. Lipase 187. AST 63 and T bili 1.7.  ALT and alk phos normal.  LFTs were normal 2 months ago.  No peritoneal signs on exam. -Normal saline at 200 cc/h -IV Dilaudid as needed -IV Phenergan PRN -Clear liquid diet, advance as tolerated -Continue Creon -Continue to monitor LFTs  Microscopic hematuria, suspect related to nephrolithiasis History of congenital single kidney and nephrolithiasis. Urine not grossly bloody.  UA with moderate amount of hemoglobin on dipstick and greater than 50 RBCs on microscopic examination. Hemoglobin stable at 13.0.  -Please ensure outpatient urology follow-up.  Mild hypokalemia Potassium 3.4.  Secondary to GI loss from vomiting and decreased oral intake. -Replete potassium.  Check magnesium level.  Continue to monitor electrolytes.  Hypertension Systolic currently in 641R. -Hydralazine PRN  DVT prophylaxis: SCDs Code Status: Full code Family Communication: No family available at this time. Disposition Plan: Anticipate discharge in 1 to 2 days. Consults called: None Admission status: It is my clinical opinion that referral for OBSERVATION is reasonable and necessary in this patient based on the above information provided. The aforementioned taken together are felt to place the patient at high risk for further clinical deterioration. However it is anticipated that the patient may be medically stable for discharge from the hospital within 24 to 48 hours.  The medical  decision making on this patient was of high complexity and the patient is at high risk for clinical deterioration, therefore this is a level 3 visit.  Shela Leff MD [facility] Pager [phone]  If 7PM-7AM, please contact night-coverage [email or web address] Password [facility]  10/17/2018, 6:31 AM

## 2018-10-18 ENCOUNTER — Inpatient Hospital Stay (HOSPITAL_COMMUNITY): Payer: Managed Care, Other (non HMO)

## 2018-10-18 DIAGNOSIS — I10 Essential (primary) hypertension: Secondary | ICD-10-CM

## 2018-10-18 DIAGNOSIS — E781 Pure hyperglyceridemia: Secondary | ICD-10-CM

## 2018-10-18 DIAGNOSIS — K219 Gastro-esophageal reflux disease without esophagitis: Secondary | ICD-10-CM

## 2018-10-18 LAB — NOVEL CORONAVIRUS, NAA (HOSP ORDER, SEND-OUT TO REF LAB; TAT 18-24 HRS): SARS-CoV-2, NAA: NOT DETECTED

## 2018-10-18 LAB — COMPREHENSIVE METABOLIC PANEL WITH GFR
ALT: 30 U/L (ref 0–44)
AST: 41 U/L (ref 15–41)
Albumin: 3.5 g/dL (ref 3.5–5.0)
Alkaline Phosphatase: 58 U/L (ref 38–126)
Anion gap: 11 (ref 5–15)
BUN: <5 mg/dL — ABNORMAL LOW (ref 6–20)
CO2: 24 mmol/L (ref 22–32)
Calcium: 9.1 mg/dL (ref 8.9–10.3)
Chloride: 102 mmol/L (ref 98–111)
Creatinine, Ser: 1.05 mg/dL (ref 0.61–1.24)
GFR calc Af Amer: >60 mL/min
GFR calc non Af Amer: >60 mL/min
Glucose, Bld: 157 mg/dL — ABNORMAL HIGH (ref 70–99)
Potassium: 3.9 mmol/L (ref 3.5–5.1)
Sodium: 137 mmol/L (ref 135–145)
Total Bilirubin: 0.9 mg/dL (ref 0.3–1.2)
Total Protein: 7.1 g/dL (ref 6.5–8.1)

## 2018-10-18 LAB — LIPASE, BLOOD: Lipase: 646 U/L — ABNORMAL HIGH (ref 11–51)

## 2018-10-18 MED ORDER — FAMOTIDINE IN NACL 20-0.9 MG/50ML-% IV SOLN
20.0000 mg | Freq: Two times a day (BID) | INTRAVENOUS | Status: DC
  Administered 2018-10-18 – 2018-10-21 (×6): 20 mg via INTRAVENOUS
  Filled 2018-10-18 (×7): qty 50

## 2018-10-18 MED ORDER — HYDRALAZINE HCL 20 MG/ML IJ SOLN
10.0000 mg | INTRAMUSCULAR | Status: DC | PRN
  Administered 2018-10-22: 10 mg via INTRAVENOUS
  Filled 2018-10-18 (×2): qty 1

## 2018-10-18 NOTE — Progress Notes (Signed)
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  Lipase elevated to 186.  On bowel rest, IV fluid and pain medications.  Subjective: Continues to endorse significant abdominal pain that he rates 9-10/10.  Pain is sharp, stabbing and aching radiating to his back.  He also reports emesis when he is in pain.  Denies chest pain or dyspnea.  On clear liquid diet.  Lipase trended up.  Objective: Vitals:   10/17/18 2132 10/18/18 0200 10/18/18 0406 10/18/18 0847  BP: 140/90  (!) 164/93 (!) 150/99  Pulse:   84 83  Resp:  16 17 17   Temp:   98.2 F (36.8 C) 98.4 F (36.9 C)  TempSrc:   Oral Oral  SpO2:   98% 98%  Weight:      Height:        Intake/Output Summary (Last 24 hours) at 10/18/2018 1239 Last data filed at 10/18/2018 0900 Gross per 24 hour  Intake 2612.16 ml  Output 700 ml  Net 1912.16 ml   Filed Weights   10/17/18 0739  Weight: 77.5 kg    Examination:  GENERAL: Appears to be in pain. HEENT: MMM.  Vision and hearing grossly intact.  NECK: Supple.  No JVD.  LUNGS:  No IWOB. Good air movement bilaterally. HEART:  RRR. Heart sounds normal.  ABD: Bowel sounds present.  Tender to palpation. MSK/EXT:  Moves extremities. No apparent deformity or edema.  SKIN: no apparent skin lesion or wound NEURO: Awake, alert and oriented appropriately.  No gross deficit.   I have personally reviewed the following labs and images:  Radiology Studies: No results found.  Microbiology: No results found for this or any previous visit (from the past 240 hour(s)).  Sepsis Labs: Invalid  input(s): PROCALCITONIN, LACTICIDVEN  Urine analysis:    Component Value Date/Time   COLORURINE AMBER (A) 10/16/2018 0040   APPEARANCEUR CLEAR 10/16/2018 0040   LABSPEC 1.032 (H) 10/16/2018 0040   PHURINE 5.0 10/16/2018 0040   GLUCOSEU NEGATIVE 10/16/2018 0040   HGBUR MODERATE (A) 10/16/2018 0040   BILIRUBINUR SMALL (A) 10/16/2018 0040   KETONESUR 20 (A) 10/16/2018 0040   PROTEINUR 100 (A) 10/16/2018 0040   UROBILINOGEN 0.2 01/20/2015 1643   NITRITE NEGATIVE 10/16/2018 0040   LEUKOCYTESUR NEGATIVE 10/16/2018 0040    Anemia Panel: No results for input(s): VITAMINB12, FOLATE, FERRITIN, TIBC, IRON, RETICCTPCT in the last 72 hours.  Thyroid Function Tests: No results for input(s): TSH, T4TOTAL, FREET4, T3FREE, THYROIDAB in the last 72 hours.  Lipid Profile: No results for input(s): CHOL, HDL, LDLCALC, TRIG, CHOLHDL, LDLDIRECT in the last 72 hours.  CBG: No results for input(s): GLUCAP in the last 168 hours.  HbA1C: No results for input(s): HGBA1C in the last 72 hours.  BNP (last 3 results): No results for input(s): PROBNP in the last 8760 hours.  Cardiac Enzymes: No results for input(s): CKTOTAL, CKMB, CKMBINDEX, TROPONINI in the last 168 hours.  Coagulation Profile: No results for input(s): INR, PROTIME in the last 168 hours.  Liver Function Tests: Recent Labs  Lab 10/16/18 2254 10/17/18 0708 10/18/18 0352  AST 63* 57* 41  ALT 37 34 30  ALKPHOS 70 67 58  BILITOT  1.7* 1.9* 0.9  PROT 7.4 6.9 7.1  ALBUMIN 3.9 3.6 3.5   Recent Labs  Lab 10/16/18 2254 10/18/18 0518  LIPASE 187* 646*   No results for input(s): AMMONIA in the last 168 hours.  Basic Metabolic Panel: Recent Labs  Lab 10/16/18 2254 10/17/18 0708 10/18/18 0352  NA 137 138 137  K 3.4* 3.6 3.9  CL 103 104 102  CO2 21* 23 24  GLUCOSE 121* 138* 157*  BUN 7 5* <5*  CREATININE 1.31* 1.17 1.05  CALCIUM 9.3 9.1 9.1  MG  --  1.9  --    GFR: Estimated Creatinine Clearance: 93.3 mL/min (by C-G  formula based on SCr of 1.05 mg/dL).  CBC: Recent Labs  Lab 10/16/18 2254  WBC 9.4  HGB 14.8  HCT 43.2  MCV 95.8  PLT 201    Procedures:  None  Microbiology: COVID-19 negative.  Assessment & Plan: Recurrent pancreatitis: Likely due to pancreatic divisum as demonstrated on previous imaging.  Lipase trended from 187-646.  Appears to be in significant pain.  Does not drink alcohol.  TG previously in the range of 250-350.  No hypercalcemia.  Status post cholecystectomy. -Continue clear liquid -Continue Creon, IV fluid, IV Dilaudid and PRN Zofran -Check triglycerides again -Trend lipase -Obtain CT abdomen and pelvis  HTN: BP slightly elevated likely due to IV fluid.  On my card is at home. -Use PRN hydralazine  GERD -Add IV Pepcid.  History of allergy to PPI.  DVT prophylaxis: SCD Code Status: Full code Family Communication: Patient update family let me know if any question Disposition Plan: Remains inpatient for acute pancreatitis.  Patient is in significant pain and does not tolerate p.o. intake Consultants: None  Antimicrobials: Anti-infectives (From admission, onward)   None      Sch Meds:  Scheduled Meds: . lipase/protease/amylase  36,000 Units Oral BID WC  . potassium chloride  40 mEq Oral Once   Continuous Infusions: . dextrose 5 % and 0.45 % NaCl with KCl 10 mEq/L 125 mL/hr at 10/18/18 0900   PRN Meds:.acetaminophen **OR** acetaminophen, hydrALAZINE, HYDROmorphone (DILAUDID) injection, lipase/protease/amylase, ondansetron (ZOFRAN) IV, promethazine   [name]  If 7PM-7AM, please contact night-coverage [email or web address] Password [facility] 10/18/2018, 12:39 PM

## 2018-10-18 NOTE — Plan of Care (Signed)

## 2018-10-18 NOTE — Plan of Care (Signed)
  Problem: Pain Managment: Goal: General experience of comfort will improve Outcome: Progressing   

## 2018-10-19 LAB — COMPREHENSIVE METABOLIC PANEL
ALT: 21 U/L (ref 0–44)
AST: 28 U/L (ref 15–41)
Albumin: 2.9 g/dL — ABNORMAL LOW (ref 3.5–5.0)
Alkaline Phosphatase: 49 U/L (ref 38–126)
Anion gap: 10 (ref 5–15)
BUN: <5 mg/dL — ABNORMAL LOW (ref 6–20)
CO2: 25 mmol/L (ref 22–32)
Calcium: 8.5 mg/dL — ABNORMAL LOW (ref 8.9–10.3)
Chloride: 101 mmol/L (ref 98–111)
Creatinine, Ser: 1.13 mg/dL (ref 0.61–1.24)
GFR calc Af Amer: >60 mL/min (ref >60)
GFR calc non Af Amer: >60 mL/min (ref >60)
Glucose, Bld: 108 mg/dL — ABNORMAL HIGH (ref 70–99)
Potassium: 3.8 mmol/L (ref 3.5–5.1)
Sodium: 136 mmol/L (ref 135–145)
Total Bilirubin: 1.3 mg/dL — ABNORMAL HIGH (ref 0.3–1.2)
Total Protein: 6.1 g/dL — ABNORMAL LOW (ref 6.5–8.1)

## 2018-10-19 LAB — LIPID PANEL
Cholesterol: 133 mg/dL (ref 0–200)
HDL: 77 mg/dL (ref >40)
LDL Cholesterol: 49 mg/dL (ref 0–99)
Total CHOL/HDL Ratio: 1.7 RATIO
Triglycerides: 37 mg/dL (ref <150)
VLDL: 7 mg/dL (ref 0–40)

## 2018-10-19 LAB — LIPASE, BLOOD: Lipase: 114 U/L — ABNORMAL HIGH (ref 11–51)

## 2018-10-19 MED ORDER — ENSURE ENLIVE PO LIQD
237.0000 mL | Freq: Two times a day (BID) | ORAL | Status: DC
  Administered 2018-10-19 – 2018-10-24 (×9): 237 mL via ORAL

## 2018-10-19 NOTE — Plan of Care (Signed)
  Problem: Pain Managment: Goal: General experience of comfort will improve Outcome: Progressing   

## 2018-10-19 NOTE — Progress Notes (Signed)
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  Lipase elevated to 186.  Admitted on clear liquid diet, IV fluid and pain medications.  Subjective: No major events overnight of this morning.  Kyle Berry with acute edematous pancreatitis with peripancreatic inflammation and a small amount of free fluid but no well-defined peripancreatic fluid collection or necrosis.  Pain improved this morning.  No nausea or vomiting.   Objective: Vitals:   10/18/18 1652 10/18/18 1936 10/19/18 0559 10/19/18 1001  BP: (!) 150/99 (!) 147/92 126/90 123/86  Pulse: 89 81 89 79  Resp:  14 14 16   Temp: 98.3 F (36.8 C) 99.5 F (37.5 C) 98.5 F (36.9 C) 98 F (36.7 C)  TempSrc: Oral Oral Oral Oral  SpO2: 97% 96% 94% 99%  Weight:      Height:        Intake/Output Summary (Last 24 hours) at 10/19/2018 1507 Last data filed at 10/19/2018 1243 Gross per 24 hour  Intake 1738.56 ml  Output 403 ml  Net 1335.56 ml   Filed Weights   10/17/18 0739  Weight: 77.5 kg    Examination:  GENERAL: No acute distress.  Appears well.  HEENT: MMM.  Vision and hearing grossly intact.  NECK: Supple.  No JVD.  LUNGS:  No IWOB. Good air movement bilaterally. HEART:  RRR. Heart sounds normal.  ABD: Bowel sounds present.  Tender to palpation. MSK/EXT:  Moves all extremities. No apparent deformity. No edema bilaterally.  SKIN: no apparent skin lesion or wound NEURO: Awake, alert and oriented appropriately.  No gross deficit.  PSYCH: Calm. Normal affect.  I have personally reviewed the following labs and images:  Radiology  Studies: Ct Abdomen Pelvis Wo Contrast  Result Date: 10/18/2018 CLINICAL DATA:  Pancreatitis, acute, SIRS, elev WBC, fever EXAM: CT ABDOMEN AND PELVIS WITHOUT CONTRAST TECHNIQUE: Multidetector CT imaging of the abdomen and pelvis was performed following the standard protocol without IV contrast. COMPARISON:  Contrast enhanced CT 07/23/2018 FINDINGS: Lower chest: Linear atelectasis in both lower lobes. Left pleural thickening without frank effusion. Hepatobiliary: Generalized hepatic steatosis. Tiny subcentimeter lesions on prior contrast-enhanced exam are not visualized in the absence of IV contrast. Postcholecystectomy without biliary dilatation. Pancreas: Peripancreatic fat stranding about the entire pancreas, most prominent about the body and tail. Small amount of peripancreatic fluid tracks in the left pericolic gutter and anterior left pararenal space. No well-defined peripancreatic fluid collection on noncontrast exam. No ductal dilatation. No pancreatic calcifications. Spleen: Normal in size without focal abnormality. Adrenals/Urinary Tract: Normal adrenal glands. Chronic marked right renal atrophy with small cysts. Compensatory hypertrophy of the left kidney. Nonobstructing stones in the lower left kidney. No hydronephrosis. Left ureter is decompressed without stone along the course. Urinary bladder is minimally distended. No bladder wall thickening. Stomach/Bowel: Ingested material in the stomach. No small bowel obstruction or inflammation. Fluid-filled slightly prominent distal small bowel suggests degree of ileus. Normal appendix. Submucosal fatty infiltration of the ascending colon consistent with prior inflammation. Descending colon is decompressed, mild edema about the descending colon appears similar to prior and is likely reactive due tube pancreatic inflammation. Vascular/Lymphatic: Normal caliber abdominal aorta. Vascular patency cannot be assessed in the absence of  IV contrast. Multiple  prominent peripancreatic nodes are likely reactive. Reproductive: Prostate is unremarkable. Other: Free fluid tracks in the left pericolic gutter into the pelvis. No free air. No evidence of acute peripancreatic or other focal fluid collection. Musculoskeletal: Degenerative disc disease at L5-S1. There are no acute or suspicious osseous abnormalities. IMPRESSION: 1. Acute edematous pancreatitis with peripancreatic inflammation and small amount of free fluid. No well-defined peripancreatic fluid collection on noncontrast exam. 2. Chronic findings include hepatic steatosis and nonobstructing left nephrolithiasis. Chronic marked right renal atrophy. Electronically Signed   By: Narda Rutherford M.D.   On: 10/18/2018 19:53    Microbiology: Recent Results (from the past 240 hour(s))  Novel Coronavirus,NAA,(SEND-OUT TO REF LAB - TAT 24-48 hrs); Hosp Order     Status: None   Collection Time: 10/17/18  4:56 AM   Specimen: Nasopharyngeal Swab; Respiratory  Result Value Ref Range Status   SARS-CoV-2, NAA NOT DETECTED NOT DETECTED Final    Comment: (NOTE) This test was developed and its performance characteristics determined by [employer]. This test has not been FDA cleared or approved. This test has been authorized by FDA under an Emergency Use Authorization (EUA). This test is only authorized for the duration of time the declaration that circumstances exist justifying the authorization of the emergency use of in vitro diagnostic tests for detection of SARS-CoV-2 virus and/or diagnosis of COVID-19 infection under section 564(b)(1) of the Act, 21 U.S.C. 956LOV-5(I)(4), unless the authorization is terminated or revoked sooner. When diagnostic testing is negative, the possibility of a false negative result should be considered in the context of a patient's recent exposures and the presence of clinical signs and symptoms consistent with COVID-19. An individual without symptoms of COVID-19 and who  is not shedding SARS-CoV-2 virus would expect to have a negative (not detected) result in this assay. Performed  At: [facility] [address] [place], [place] [id number] Jolene Schimke MD ZY:[id number]    Coronavirus Source NASOPHARYNGEAL  Final    Comment: Performed at [facility], [address] [place], [place] [address]    Sepsis Labs: Invalid input(s): PROCALCITONIN, LACTICIDVEN  Urine analysis:    Component Value Date/Time   COLORURINE AMBER (A) 10/16/2018 0040   APPEARANCEUR CLEAR 10/16/2018 0040   LABSPEC 1.032 (H) 10/16/2018 0040   PHURINE 5.0 10/16/2018 0040   GLUCOSEU NEGATIVE 10/16/2018 0040   HGBUR MODERATE (A) 10/16/2018 0040   BILIRUBINUR SMALL (A) 10/16/2018 0040   KETONESUR 20 (A) 10/16/2018 0040   PROTEINUR 100 (A) 10/16/2018 0040   UROBILINOGEN 0.2 01/20/2015 1643   NITRITE NEGATIVE 10/16/2018 0040   LEUKOCYTESUR NEGATIVE 10/16/2018 0040    Anemia Panel: No results for input(s): VITAMINB12, FOLATE, FERRITIN, TIBC, IRON, RETICCTPCT in the last 72 hours.  Thyroid Function Tests: No results for input(s): TSH, T4TOTAL, FREET4, T3FREE, THYROIDAB in the last 72 hours.  Lipid Profile: Recent Labs    10/19/18 0304  CHOL 133  HDL 77  LDLCALC 49  TRIG 37  CHOLHDL 1.7    CBG: No results for input(s): GLUCAP in the last 168 hours.  HbA1C: No results for input(s): HGBA1C in the last 72 hours.  BNP (last 3 results): No results for input(s): PROBNP in the last 8760 hours.  Cardiac Enzymes: No results for input(s): CKTOTAL, CKMB, CKMBINDEX, TROPONINI in the last 168 hours.  Coagulation Profile: No results for input(s): INR, PROTIME in the last 168 hours.  Liver Function Tests: Recent Labs  Lab 10/16/18 2254 10/17/18 0708 10/18/18 5732  10/19/18 0304  AST 63* 57* 41 28  ALT 37 34 30 21  ALKPHOS 70 67 58 49  BILITOT 1.7* 1.9* 0.9 1.3*  PROT 7.4 6.9 7.1 6.1*  ALBUMIN 3.9 3.6 3.5 2.9*   Recent Labs  Lab 10/16/18 2254  10/18/18 0518 10/19/18 0304  LIPASE 187* 646* 114*   No results for input(s): AMMONIA in the last 168 hours.  Basic Metabolic Panel: Recent Labs  Lab 10/16/18 2254 10/17/18 0708 10/18/18 0352 10/19/18 0304  NA 137 138 137 136  K 3.4* 3.6 3.9 3.8  CL 103 104 102 101  CO2 21* 23 24 25   GLUCOSE 121* 138* 157* 108*  BUN 7 5* <5* <5*  CREATININE 1.31* 1.17 1.05 1.13  CALCIUM 9.3 9.1 9.1 8.5*  MG  --  1.9  --   --    GFR: Estimated Creatinine Clearance: 86.7 mL/min (by C-G formula based on SCr of 1.13 mg/dL).  CBC: Recent Labs  Lab 10/16/18 2254  WBC 9.4  HGB 14.8  HCT 43.2  MCV 95.8  PLT 201    Procedures:  None  Microbiology: COVID-19 negative.  Assessment & Plan: Recurrent pancreatitis: Likely due to pancreatic divisum as demonstrated on previous imaging.  -Not a drinker.  Triglyceride in normal range.  No hypercalcemia.  Status post cholecystectomy. -Lipase trended from [phone].  -CT abdomen and pelvis with acute edematous pancreatitis with peripancreatic inflammation and a small amount of free fluid -Continue IV fluid, clear liquid diet -Add Ensure twice daily -Continue Creon, IV Dilaudid and PRN Zofran -Trend lipase -Check IgG 4  HTN: Normotensive. -Use PRN hydralazine  GERD -Added IV Pepcid.  History of allergy to PPI.  DVT prophylaxis: SCD Code Status: Full code Family Communication: Patient update family let me know if any question Disposition Plan: Remains inpatient for acute pancreatitis pending improvement in pain and p.o. tolerance. Consultants: None  Antimicrobials: Anti-infectives (From admission, onward)   None      Sch Meds:  Scheduled Meds:  lipase/protease/amylase  36,000 Units Oral BID WC   potassium chloride  40 mEq Oral Once   Continuous Infusions:  dextrose 5 % and 0.45 % NaCl with KCl 10 mEq/L 125 mL/hr (10/19/18 0141)   famotidine (PEPCID) IV Stopped (10/19/18 1110)   PRN Meds:.acetaminophen **OR**  acetaminophen, hydrALAZINE, HYDROmorphone (DILAUDID) injection, lipase/protease/amylase, ondansetron (ZOFRAN) IV, promethazine   Aaniyah Strohm T. Jerad Dunlap [facility]  If 7PM-7AM, please contact night-coverage [email or web address] Password [email or web address] 10/19/2018, 3:07 PM

## 2018-10-20 LAB — LIPASE, BLOOD: Lipase: 94 U/L — ABNORMAL HIGH (ref 11–51)

## 2018-10-20 NOTE — Progress Notes (Signed)
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  Lipase elevated to 186. covid 19-negative   Admitted on clear liquid diet, IV fluid and pain medications  Assessment/Plan:  Recurrent pancreatitis: Likely due to pancreatic divisum as demonstrated on previous imaging. Not a drinker.  Triglyceride in normal range.  No hypercalcemia.  Status post cholecystectomy. CT abdomen and pelvis with acute edematous pancreatitis with peripancreatic inflammation and a small amount of free fluid -slowly improving, not able to tolerate diety well yet. Lipase trended from [phone]. Continue IV fluid, clear liquid diet, advance if tolerated.   HTN: Normotensive. Use PRN hydralazine  GERD. IV Pepcid.  History of allergy to PPI.  Code Status: full Family Communication: d/w patient, RN (indicate person spoken with, relationship, and if by phone, the number) Disposition Plan: home when stable    Consultants:  none  Procedures:  none  Antibiotics:  none (indicate start date, and stop date if known)  HPI/Subjective: Pt reports nausea, pain, not able to eat well yet. Pain medication is helping   Objective: Vitals:   10/20/18 0408 10/20/18 0852  BP: 120/89 133/86  Pulse: 68 69  Resp: 14 16  Temp: 98.7 F (37.1 C) 98.4 F (36.9 C)  SpO2: 98% 98%    Intake/Output Summary (Last 24 hours) at 10/20/2018 1111 Last data filed at 10/19/2018 1700 Gross per 24 hour  Intake 650 ml  Output -  Net 650 ml   Filed Weights   10/17/18 0739  Weight: 77.5 kg    Exam:   General:  No acute distress   Cardiovascular: s1,s2  rrr  Respiratory: CTA BL  Abdomen:  Soft, mild tender   Musculoskeletal: no leg edema    Data Reviewed: Basic Metabolic Panel: Recent Labs  Lab 10/16/18 2254 10/17/18 0708 10/18/18 0352 10/19/18 0304  NA 137 138 137 136  K 3.4* 3.6 3.9 3.8  CL 103 104 102 101  CO2 21* 23 24 25   GLUCOSE 121* 138* 157* 108*  BUN 7 5* <5* <5*  CREATININE 1.31* 1.17 1.05 1.13  CALCIUM 9.3 9.1 9.1 8.5*  MG  --  1.9  --   --    Liver Function Tests: Recent Labs  Lab 10/16/18 2254 10/17/18 0708 10/18/18 0352 10/19/18 0304  AST 63* 57* 41 28  ALT 37 34 30 21  ALKPHOS 70 67 58 49  BILITOT 1.7* 1.9* 0.9 1.3*  PROT 7.4 6.9 7.1 6.1*  ALBUMIN 3.9 3.6 3.5 2.9*   Recent Labs  Lab 10/16/18 2254 10/18/18 0518 10/19/18 0304 10/20/18 0513  LIPASE 187* 646* 114* 94*   No results for input(s): AMMONIA in the last 168 hours. CBC: Recent Labs  Lab 10/16/18 2254  WBC 9.4  HGB 14.8  HCT 43.2  MCV 95.8  PLT 201   Cardiac Enzymes: No results for input(s): CKTOTAL, CKMB, CKMBINDEX, TROPONINI in the last 168 hours. BNP (last 3 results) Recent Labs    01/20/18 2206  BNP 27.6    ProBNP (last 3 results) No results for input(s): PROBNP in the last 8760 hours.  CBG: No results for input(s): GLUCAP in the last 168 hours.  Recent Results (from the past 240 hour(s))  Novel Coronavirus,NAA,(SEND-OUT TO REF LAB - TAT 24-48  hrs); Hosp Order     Status: None   Collection Time: 10/17/18  4:56 AM   Specimen: Nasopharyngeal Swab; Respiratory  Result Value Ref Range Status   SARS-CoV-2, NAA NOT DETECTED NOT DETECTED Final    Comment: (NOTE) This test was developed and its performance characteristics determined by [employer]. This test has not been FDA cleared or approved. This test has been authorized by FDA under an Emergency Use Authorization (EUA). This test is only authorized for the duration of time the declaration that circumstances exist justifying the authorization of the  emergency use of in vitro diagnostic tests for detection of SARS-CoV-2 virus and/or diagnosis of COVID-19 infection under section 564(b)(1) of the Act, 21 U.S.C. 976BHA-1(P)(3), unless the authorization is terminated or revoked sooner. When diagnostic testing is negative, the possibility of a false negative result should be considered in the context of a patient's recent exposures and the presence of clinical signs and symptoms consistent with COVID-19. An individual without symptoms of COVID-19 and who is not shedding SARS-CoV-2 virus would expect to have a negative (not detected) result in this assay. Performed  At: [facility] [address] [place], [place] [id number] Rush Farmer MD ZH:[id number]    [place]  Final    Comment: Performed at [facility], [place] [address] [place], [place] [address]     Studies: Ct Abdomen Pelvis Wo Contrast  Result Date: 10/18/2018 CLINICAL DATA:  Pancreatitis, acute, SIRS, elev WBC, fever EXAM: CT ABDOMEN AND PELVIS WITHOUT CONTRAST TECHNIQUE: Multidetector CT imaging of the abdomen and pelvis was performed following the standard protocol without IV contrast. COMPARISON:  Contrast enhanced CT 07/23/2018 FINDINGS: Lower chest: Linear atelectasis in both lower lobes. Left pleural thickening without frank effusion. Hepatobiliary: Generalized hepatic steatosis. Tiny subcentimeter lesions on prior contrast-enhanced exam are not visualized in the absence of IV contrast. Postcholecystectomy without biliary dilatation. Pancreas: Peripancreatic fat stranding about the entire pancreas, most prominent about the body and tail. Small amount of peripancreatic fluid tracks in the left pericolic gutter and anterior left pararenal space. No well-defined peripancreatic fluid collection on noncontrast exam. No ductal dilatation. No pancreatic calcifications. Spleen: Normal in size without focal abnormality. Adrenals/Urinary Tract:  Normal adrenal glands. Chronic marked right renal atrophy with small cysts. Compensatory hypertrophy of the left kidney. Nonobstructing stones in the lower left kidney. No hydronephrosis. Left ureter is decompressed without stone along the course. Urinary bladder is minimally distended. No bladder wall thickening. Stomach/Bowel: Ingested material in the stomach. No small bowel obstruction or inflammation. Fluid-filled slightly prominent distal small bowel suggests degree of ileus. Normal appendix. Submucosal fatty infiltration of the ascending colon consistent with prior inflammation. Descending colon is decompressed, mild edema about the descending colon appears similar to prior and is likely reactive due tube pancreatic inflammation. Vascular/Lymphatic: Normal caliber abdominal aorta. Vascular patency cannot be assessed in the absence of IV contrast. Multiple prominent peripancreatic nodes are likely reactive. Reproductive: Prostate is unremarkable. Other: Free fluid tracks in the left pericolic gutter into the pelvis. No free air. No evidence of acute peripancreatic or other focal fluid collection. Musculoskeletal: Degenerative disc disease at L5-S1. There are no acute or suspicious osseous abnormalities. IMPRESSION: 1. Acute edematous pancreatitis with peripancreatic inflammation and small amount of free fluid. No well-defined peripancreatic fluid collection on noncontrast exam. 2. Chronic findings include hepatic steatosis and nonobstructing left nephrolithiasis. Chronic marked right renal atrophy. Electronically Signed   By: Keith Rake M.D.   On: 10/18/2018 19:53  Scheduled Meds: . feeding supplement (ENSURE ENLIVE)  237 mL Oral BID BM  . lipase/protease/amylase  36,000 Units Oral BID WC  . potassium chloride  40 mEq Oral Once   Continuous Infusions: . dextrose 5 % and 0.45 % NaCl with KCl 10 mEq/L 125 mL/hr at 10/20/18 0951  . famotidine (PEPCID) IV 20 mg (10/20/18 0901)    Principal  Problem:   Recurrent acute pancreatitis Active Problems:   Essential hypertension   Hypokalemia   Nephrolithiasis   Microscopic hematuria    Time spent: >35 minutes     Kinnie Feil  [facility] Pager [phone]. If 7PM-7AM, please contact night-coverage at [email or web address], password [facility] 10/20/2018, 11:11 AM  LOS: 3 days

## 2018-10-21 LAB — IGG 4: IgG, Subclass 4: 7 mg/dL (ref 2–96)

## 2018-10-21 MED ORDER — FAMOTIDINE 20 MG PO TABS
20.0000 mg | ORAL_TABLET | Freq: Every day | ORAL | Status: DC
  Administered 2018-10-22 – 2018-10-24 (×3): 20 mg via ORAL
  Filled 2018-10-21 (×3): qty 1

## 2018-10-21 NOTE — Progress Notes (Signed)
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  Lipase elevated to 186. covid 19-negative   Admitted on clear liquid diet, IV fluid and pain medications  Assessment/Plan:  Recurrent pancreatitis: Likely due to pancreatic divisum as demonstrated on previous imaging. Not a drinker.  Triglyceride in normal range.  No hypercalcemia.  Status post cholecystectomy. CT abdomen and pelvis with acute edematous pancreatitis with peripancreatic inflammation and a small amount of free fluid -slowly improving, not able to tolerate diety well yet. Lipase trended from [phone]. Continue IV fluid, clear liquid diet, advance if tolerated today.   HTN: Normotensive. Use PRN hydralazine  GERD. IV Pepcid.  History of allergy to PPI.  Code Status: full Family Communication: d/w patient, RN (indicate person spoken with, relationship, and if by phone, the number) Disposition Plan: home when stable    Consultants:  none  Procedures:  none  Antibiotics:  none (indicate start date, and stop date if known)  HPI/Subjective: Pt reports some worsening pain and nausea, pain AM, not able to eat well yet. Pain medication is helping   Objective: Vitals:   10/21/18 0519 10/21/18 0819  BP: (!) 156/90 138/87  Pulse: 66 67  Resp: 20 17  Temp: 98.3 F (36.8 C) 98.6 F (37 C)  SpO2: 93% 97%   No intake or output data in the 24 hours ending 10/21/18 1101 Filed Weights   10/17/18 0739  Weight: 77.5 kg    Exam:   General:  No acute distress   Cardiovascular: s1,s2 rrr  Respiratory: CTA BL  Abdomen:  Soft, mild tender    Musculoskeletal: no leg edema    Data Reviewed: Basic Metabolic Panel: Recent Labs  Lab 10/16/18 2254 10/17/18 0708 10/18/18 0352 10/19/18 0304  NA 137 138 137 136  K 3.4* 3.6 3.9 3.8  CL 103 104 102 101  CO2 21* 23 24 25   GLUCOSE 121* 138* 157* 108*  BUN 7 5* <5* <5*  CREATININE 1.31* 1.17 1.05 1.13  CALCIUM 9.3 9.1 9.1 8.5*  MG  --  1.9  --   --    Liver Function Tests: Recent Labs  Lab 10/16/18 2254 10/17/18 0708 10/18/18 0352 10/19/18 0304  AST 63* 57* 41 28  ALT 37 34 30 21  ALKPHOS 70 67 58 49  BILITOT 1.7* 1.9* 0.9 1.3*  PROT 7.4 6.9 7.1 6.1*  ALBUMIN 3.9 3.6 3.5 2.9*   Recent Labs  Lab 10/16/18 2254 10/18/18 0518 10/19/18 0304 10/20/18 0513  LIPASE 187* 646* 114* 94*   No results for input(s): AMMONIA in the last 168 hours. CBC: Recent Labs  Lab 10/16/18 2254  WBC 9.4  HGB 14.8  HCT 43.2  MCV 95.8  PLT 201   Cardiac Enzymes: No results for input(s): CKTOTAL, CKMB, CKMBINDEX, TROPONINI in the last 168 hours. BNP (last 3 results) Recent Labs    01/20/18 2206  BNP 27.6    ProBNP (last 3 results) No results for input(s): PROBNP in the last 8760 hours.  CBG: No results for input(s): GLUCAP in the last 168 hours.  Recent Results (from the past 240 hour(s))  Novel Coronavirus,NAA,(SEND-OUT TO REF LAB - TAT 24-48 hrs); Hosp Order     Status: None   Collection Time:  10/17/18  4:56 AM   Specimen: Nasopharyngeal Swab; Respiratory  Result Value Ref Range Status   SARS-CoV-2, NAA NOT DETECTED NOT DETECTED Final    Comment: (NOTE) This test was developed and its performance characteristics determined by [employer]. This test has not been FDA cleared or approved. This test has been authorized by FDA under an Emergency Use Authorization (EUA). This test is only authorized for the duration of time the declaration that circumstances exist justifying the authorization of the emergency use of in vitro diagnostic tests for detection of  SARS-CoV-2 virus and/or diagnosis of COVID-19 infection under section 564(b)(1) of the Act, 21 U.S.C. 888LNZ-9(J)(2), unless the authorization is terminated or revoked sooner. When diagnostic testing is negative, the possibility of a false negative result should be considered in the context of a patient's recent exposures and the presence of clinical signs and symptoms consistent with COVID-19. An individual without symptoms of COVID-19 and who is not shedding SARS-CoV-2 virus would expect to have a negative (not detected) result in this assay. Performed  At: [facility] [address] [place], [place] [id number] Rush Farmer MD BP:[id number]    [place]  Final    Comment: Performed at [facility], [place] [address] [place],  [address]     Studies: No results found.  Scheduled Meds: . feeding supplement (ENSURE ENLIVE)  237 mL Oral BID BM  . lipase/protease/amylase  36,000 Units Oral BID WC  . potassium chloride  40 mEq Oral Once   Continuous Infusions: . dextrose 5 % and 0.45 % NaCl with KCl 10 mEq/L 125 mL/hr at 10/20/18 1806  . famotidine (PEPCID) IV 20 mg (10/21/18 7473)    Principal Problem:   Recurrent acute pancreatitis Active Problems:   Essential hypertension   Hypokalemia   Nephrolithiasis   Microscopic hematuria    Time spent: >35 minutes     Kinnie Feil  [facility] Pager [phone]. If 7PM-7AM, please contact night-coverage at [email or web address], password [facility] 10/21/2018, 11:01 AM  LOS: 4 days

## 2018-10-21 NOTE — Plan of Care (Signed)
  Problem: Education: Goal: Knowledge of General Education information will improve Description Including pain rating scale, medication(s)/side effects and non-pharmacologic comfort measures Outcome: Progressing   

## 2018-10-22 LAB — GLUCOSE, CAPILLARY: Glucose-Capillary: 112 mg/dL — ABNORMAL HIGH (ref 70–99)

## 2018-10-22 MED ORDER — HYDROMORPHONE HCL 1 MG/ML IJ SOLN
0.5000 mg | INTRAMUSCULAR | Status: DC | PRN
  Administered 2018-10-23 (×2): 0.5 mg via INTRAVENOUS
  Filled 2018-10-22 (×2): qty 1

## 2018-10-22 MED ORDER — OXYCODONE HCL 5 MG PO TABS
5.0000 mg | ORAL_TABLET | Freq: Four times a day (QID) | ORAL | Status: DC | PRN
  Administered 2018-10-22 – 2018-10-24 (×4): 10 mg via ORAL
  Filled 2018-10-22 (×5): qty 2

## 2018-10-22 NOTE — Progress Notes (Signed)
The patient has been updating his contact person, Randell Patient (mother) [phone] regarding plan of care.  The patient states that "the nurse does not need to call"

## 2018-10-22 NOTE — Plan of Care (Signed)
  Problem: Education: Goal: Knowledge of General Education information will improve Description Including pain rating scale, medication(s)/side effects and non-pharmacologic comfort measures Outcome: Progressing   

## 2018-10-22 NOTE — Progress Notes (Signed)
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 Lipase elevated to 186. covid 19-negative   Admitted onclear liquid diet, IV fluid and pain medications. The patient was advanced to full liquids and then 2 gm Na diet. He is still having considerable pain.  Consultants  . None  Procedures  . None  Antibiotics  . None  Subjective  The patient is resting comfortably. He states that he is still having quite a bit of pain, although he is tolerating a regular diet.   Objective   Vitals:  Vitals:   10/22/18 0428 10/22/18 1251  BP: 125/82 (!) 142/85  Pulse: 72 64  Resp: 16   Temp: 98.4 F (36.9 C) 98 F (36.7 C)  SpO2: 96% 99%    Exam:  Constitutional:  . The patient is awake, alert, and oriented x 3. No acute distress. Respiratory:  . No increased work of breathing.  . No wheezes, rales, or rhonchi.  . No tactile fremitus. Cardiovascular:  . Regular rate and rhythm . No murmurs, ectopy, or gallups. . No lateral PMI. No thrills. Abdomen:  . Abdomen is soft, non-distended . Normoactive bowel sounds . No hernias, masses, or organomegaly are appreciated. Musculoskeletal:  . No cyanosis, clubbing, or edema Skin:  . No rashes, lesions, ulcers . palpation of skin: no induration or nodules Neurologic:  . CN 2-12 intact . Sensation all 4 extremities intact Psychiatric:  . Mental status o Mood, affect appropriate o Orientation to person, place, time  . judgment and insight appear intact    I have personally reviewed the following:   Today's Data  . Vitals  Scheduled Meds: . famotidine  20 mg Oral Daily  . feeding  supplement (ENSURE ENLIVE)  237 mL Oral BID BM  . lipase/protease/amylase  36,000 Units Oral BID WC  . potassium chloride  40 mEq Oral Once   Continuous Infusions: . dextrose 5 % and 0.45 % NaCl with KCl 10 mEq/L 125 mL/hr at 10/22/18 1409    Principal Problem:   Recurrent acute pancreatitis Active Problems:   Essential hypertension   Hypokalemia   Nephrolithiasis   Microscopic hematuria   LOS: 5 days   A & P   Recurrent pancreatitis: Likely due to pancreatic divisum as demonstrated on previous imaging.Not a drinker. Triglyceride in normal range. No hypercalcemia. Status post cholecystectomy. CT abdomen and pelvis with acute edematous pancreatitis with peripancreatic inflammation and a small amount of free fluid. Slowly improving, not able to tolerate diety well yet. Lipase trended from [phone].Continue IV fluid, clear liquid diet, advance if tolerated today.   [id number]. Use PRN hydralazine  Single Kidney: Creatinine appears stable at 1.05 - 1.17. Monitor creatinine, electrolytes, and volume status. Avoid nephrotoxic medications, and hypotension.  GERD:  IV Pepcid. History of allergy to PPI.  I have seen and examined this patient myself. I have spent 34 minutes in his evaluation and care.  Code Status: full Family Communication: d/w patient, RN (indicate person spoken with, relationship, and if by phone, the number) Disposition Plan: home when stable    Strider Vallance, DO [facility] Direct contact: see [email or web address]  7PM-7AM contact night coverage as above 10/22/2018, 2:37 PM  LOS: 5 days

## 2018-10-22 NOTE — Progress Notes (Signed)
The patient continues to request pain medication every 2 hours.  When in the room, patient is usually napping.

## 2018-10-23 LAB — CBC WITH DIFFERENTIAL/PLATELET
Abs Immature Granulocytes: 0.02 10*3/uL (ref 0.00–0.07)
Basophils Absolute: 0 10*3/uL (ref 0.0–0.1)
Basophils Relative: 0 %
Eosinophils Absolute: 0.2 10*3/uL (ref 0.0–0.5)
Eosinophils Relative: 3 %
HCT: 34.5 % — ABNORMAL LOW (ref 39.0–52.0)
Hemoglobin: 11.5 g/dL — ABNORMAL LOW (ref 13.0–17.0)
Immature Granulocytes: 0 %
Lymphocytes Relative: 38 %
Lymphs Abs: 2.1 10*3/uL (ref 0.7–4.0)
MCH: 32.5 pg (ref 26.0–34.0)
MCHC: 33.3 g/dL (ref 30.0–36.0)
MCV: 97.5 fL (ref 80.0–100.0)
Monocytes Absolute: 0.5 10*3/uL (ref 0.1–1.0)
Monocytes Relative: 10 %
Neutro Abs: 2.7 10*3/uL (ref 1.7–7.7)
Neutrophils Relative %: 49 %
Platelets: 218 10*3/uL (ref 150–400)
RBC: 3.54 MIL/uL — ABNORMAL LOW (ref 4.22–5.81)
RDW: 13.5 % (ref 11.5–15.5)
WBC: 5.5 10*3/uL (ref 4.0–10.5)
nRBC: 0 % (ref 0.0–0.2)

## 2018-10-23 LAB — COMPREHENSIVE METABOLIC PANEL
ALT: 48 U/L — ABNORMAL HIGH (ref 0–44)
AST: 38 U/L (ref 15–41)
Albumin: 2.9 g/dL — ABNORMAL LOW (ref 3.5–5.0)
Alkaline Phosphatase: 80 U/L (ref 38–126)
Anion gap: 11 (ref 5–15)
BUN: 8 mg/dL (ref 6–20)
CO2: 25 mmol/L (ref 22–32)
Calcium: 9.1 mg/dL (ref 8.9–10.3)
Chloride: 101 mmol/L (ref 98–111)
Creatinine, Ser: 1 mg/dL (ref 0.61–1.24)
GFR calc Af Amer: >60 mL/min (ref >60)
GFR calc non Af Amer: >60 mL/min (ref >60)
Glucose, Bld: 130 mg/dL — ABNORMAL HIGH (ref 70–99)
Potassium: 3.7 mmol/L (ref 3.5–5.1)
Sodium: 137 mmol/L (ref 135–145)
Total Bilirubin: 0.5 mg/dL (ref 0.3–1.2)
Total Protein: 6.4 g/dL — ABNORMAL LOW (ref 6.5–8.1)

## 2018-10-23 NOTE — Progress Notes (Signed)
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 Lipase elevated to 186. covid 19-negative   Admitted onclear liquid diet, IV fluid and pain medications. The patient was advanced to full liquids and then 2 gm Na diet. The patient is continuing to complaing of nausea and pain. He has been unable to eat this morning.  Consultants  . None  Procedures  . None  Antibiotics  . None  Subjective  The patient is resting comfortably. This morning he is complaining of epigastric pain and nausea. He states that he is unable to eat this morning.   Objective   Vitals:  Vitals:   10/22/18 2223 10/23/18 0545  BP: (!) 153/96 (!) 134/97  Pulse: 71 72  Resp:  18  Temp:  (!) 97.4 F (36.3 C)  SpO2:  97%    Exam:  Constitutional:  . The patient is awake, alert, and oriented x 3. No acute distress. Respiratory:  . No increased work of breathing.  . No wheezes, rales, or rhonchi.  . No tactile fremitus. Cardiovascular:  . Regular rate and rhythm . No murmurs, ectopy, or gallups. . No lateral PMI. No thrills. Abdomen:  . Abdomen is soft, non-distended. There is epigastric tenderness and guarding. . Normoactive bowel sounds . No hernias, masses, or organomegaly are appreciated. Musculoskeletal:  . No cyanosis, clubbing, or edema Skin:  . No rashes, lesions, ulcers . palpation of skin: no induration or nodules Neurologic:  . CN 2-12 intact . Sensation all 4 extremities intact Psychiatric:  . Mental status o Mood, affect appropriate o Orientation to person, place, time  . judgment and insight appear intact    I have personally reviewed the  following:   Today's Data  . Vitals  Scheduled Meds: . famotidine  20 mg Oral Daily  . feeding supplement (ENSURE ENLIVE)  237 mL Oral BID BM  . lipase/protease/amylase  36,000 Units Oral BID WC  . potassium chloride  40 mEq Oral Once   Continuous Infusions: . dextrose 5 % and 0.45 % NaCl with KCl 10 mEq/L 125 mL/hr at 10/23/18 8119    Principal Problem:   Recurrent acute pancreatitis Active Problems:   Essential hypertension   Hypokalemia   Nephrolithiasis   Microscopic hematuria   LOS: 6 days   A & P   Recurrent pancreatitis: Likely due to pancreatic divisum as demonstrated on previous imaging.Not a drinker. Triglyceride in normal range. No hypercalcemia. Status post cholecystectomy. CT abdomen and pelvis with acute edematous pancreatitis with peripancreatic inflammation and a small amount of free fluid. Slowly improving, not able to tolerate diety well yet. Lipase trended from [phone].IV Dilaudid has been stopped and the patient is being offered oral pain medication. He states that this has been inadequate. I will increase the dose of oral medication, but would prefer to avoid IV narcotics.   [id number]. Use PRN hydralazine  Single Kidney: Creatinine appears stable at 1.00 - 1.17. Monitor creatinine, electrolytes, and volume status. Avoid nephrotoxic medications, and hypotension.  GERD:  IV Pepcid. History of allergy to PPI.  I have seen and examined this patient myself. I have spent 30 minutes in his evaluation and care.  Code Status: full Family Communication: d/w patient, RN (indicate person spoken with, relationship, and  if by phone, the number) Disposition Plan: home when stable  DVT Prophylaxis: SCD's  Stevon Gough, DO [facility] Direct contact: see [email or web address]  7PM-7AM contact night coverage as above 10/23/2018, 1:16 PM  LOS: 5 days

## 2018-10-23 NOTE — Plan of Care (Signed)
  Problem: Education: Goal: Knowledge of General Education information will improve Description: Including pain rating scale, medication(s)/side effects and non-pharmacologic comfort measures Outcome: Progressing   Problem: Clinical Measurements: Goal: Ability to maintain clinical measurements within normal limits will improve Outcome: Progressing Goal: Diagnostic test results will improve Outcome: Progressing   Problem: Pain Managment: Goal: General experience of comfort will improve Outcome: Progressing   Problem: Safety: Goal: Ability to remain free from injury will improve Outcome: Progressing   Problem: Skin Integrity: Goal: Risk for impaired skin integrity will decrease Outcome: Progressing

## 2018-10-24 LAB — LIPASE, BLOOD: Lipase: 79 U/L — ABNORMAL HIGH (ref 11–51)

## 2018-10-24 MED ORDER — FAMOTIDINE 20 MG PO TABS: 20.0000 mg | ORAL_TABLET | Freq: Every day | ORAL | 0 refills | Status: DC

## 2018-10-24 MED ORDER — ACETAMINOPHEN 325 MG PO TABS: 650.0000 mg | ORAL_TABLET | Freq: Four times a day (QID) | ORAL | 0 refills | Status: DC | PRN

## 2018-10-24 NOTE — Plan of Care (Signed)
  Problem: Education: Goal: Knowledge of General Education information will improve Description Including pain rating scale, medication(s)/side effects and non-pharmacologic comfort measures Outcome: Progressing   

## 2018-10-24 NOTE — Progress Notes (Signed)
Initial Nutrition Assessment  DOCUMENTATION CODES:   Not applicable  INTERVENTION:  Continue Ensure Enlive po BID, each supplement provides 350 kcal and 20 grams of protein  Discontinue calorie count.   NUTRITION DIAGNOSIS:   Increased nutrient needs related to acute illness(pancreatitis) as evidenced by estimated needs.  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 Meal completion 100% this AM. Pt currently has Ensure ordered and has been consuming them. Pt reports PTA following a low fat diet and consuming at least 3 meals a day with snacks in between. Boost Breeze consumed at home on occasion. Pt with no significant weight loss per weight records. MD ordered calorie count yesterday, however pt reports eating well and has been consuming his supplements. Current intake adequate. RD to discontinue calorie count. Pt reports potential plans to discharge home today. Labs and medications reviewed. Lipase has trended down to 79.   NUTRITION - FOCUSED PHYSICAL EXAM:    Most Recent Value  Orbital Region  Unable to assess  Upper Arm Region  No depletion  Thoracic and Lumbar Region  No depletion  Buccal Region  No depletion  Temple Region  Unable to assess  Clavicle Bone Region  Moderate depletion  Clavicle and Acromion Bone Region  Mild depletion  Scapular Bone Region  Unable to assess  Dorsal Hand  No depletion  Patellar Region  No depletion  Anterior Thigh Region  No depletion  Posterior Calf  Region  No depletion  Edema (RD Assessment)  Unable to assess  Hair  Unable to assess  Eyes  Unable to assess  Mouth  Unable to assess  Skin  Unable to assess  Nails  Unable to assess       Diet Order:   Diet Order            Diet Heart Room service appropriate? Yes; Fluid consistency: Thin  Diet effective now              EDUCATION NEEDS:   Not appropriate for education at this time  Skin:  Skin Assessment: Reviewed RN Assessment  Last BM:  7/8  Height:   Ht Readings from Last 1 Encounters:  10/17/18 6\' 2"  (1.88 m)    Weight:   Wt Readings from Last 1 Encounters:  10/17/18 77.5 kg    Ideal Body Weight:  86.36 kg  BMI:  Body mass index is 21.94 kg/m.  Estimated Nutritional Needs:   Kcal:  2200-2400  Protein:  105-120 grams  Fluid:  >/= 2.2 L/day    Corrin Parker, MS, RD, LDN Pager # [phone] After hours/ weekend pager # [phone]

## 2018-10-24 NOTE — Progress Notes (Signed)
Discharge paperwork and instructions given to pt. Pt not in distress and tolerated well. 

## 2018-10-24 NOTE — Discharge Summary (Signed)
Physician Discharge Summary  [name]:[id number] DOB: Dec 07, 1970 DOA: 10/16/2018  PCP: Cline Crock  Admit date: 10/16/2018 Discharge date: 10/24/2018  Recommendations for Outpatient Follow-up:  1. Follow up with PCP in 7-10 days.  Discharge Diagnoses: Principal diagnosis is #1 1. Recurrent pancreatitis 2.  Hypertension 3. Single Kidney 4. GERD  Discharge Condition: Good Disposition: Home  Diet recommendation: Heart health, non-fatty foods.  Filed Weights   10/17/18 0739  Weight: 77.5 kg    History of present illness:  Kyle Berry is a 48 y.o. male with medical history significant of recurrent pancreatitis of unclear etiology, chronic bronchitis, chronic lower back pain, congenital single kidney, nephrolithiasis, GERD, history of  cholecystectomy, hyperlipidemia, hypertension presenting to the hospital for evaluation of abdominal pain, vomiting, and blood in urine.  Patient reports 2-day history of severe epigastric abdominal pain which radiates to his back, nausea, and vomiting.  States this is similar to his prior pancreatitis episodes.  He has not been able to tolerate p.o. intake.  No fevers or chills.  States he has not used alcohol for the past 1 year.  States he has previously had blood in his urine and today noticed that his urine was darker than yellow in color but not grossly bloody.  He thinks it may have had blood in it.  States he just has 1 kidney and has been told there is a stone in it.  He is seen by a kidney specialist at [facility].  He does not take any blood thinners or aspirin at home.  ED Course: Afebrile.  Not tachycardic or hypotensive.  No leukocytosis.  Potassium 3.4.  Lipase 187. AST 63 and T bili 1.7.  ALT and alk phos normal.  LFTs were normal 2 months ago.  Urine not grossly bloody.  UA with moderate amount of hemoglobin on dipstick and greater than 50 RBCs on microscopic examination.  No signs of urine infection.  Hemoglobin  stable at 13.0.  Patient received Phenergan and Dilaudid in the ED. Continued to have persistent nausea and vomiting and was not able to tolerate p.o. intake.  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 Lipase elevated to 186. covid 19-negative   Admitted onclear liquid diet, IV fluid and pain medications. The patient was advanced to full liquids and then 2 gm Na diet. This morning the patient states that he is having no pain and is tolerating his diet well. He will be discharged to home in good condition.  Today's assessment: S: The patient is resting comfortably in bed. No new complaints. O: Vitals:  Vitals:   10/24/18 0733 10/24/18 1447  BP: 119/83 124/80  Pulse: 72 74  Resp: 18 18  Temp: 99 F (37.2 C) 98.2 F (36.8 C)  SpO2: 96% 99%    Constitutional:   The patient is awake, alert, and oriented x 3. No acute distress. Respiratory:   No increased work of breathing.   No wheezes, rales, or rhonchi.   No tactile fremitus. Cardiovascular:   Regular rate and rhythm  No murmurs, ectopy, or gallups.  No lateral PMI. No thrills. Abdomen:   Abdomen is soft, non-distended. There is no epigastric tenderness or guarding.  Normoactive bowel sounds  No hernias, masses, or organomegaly are appreciated. Musculoskeletal:   No cyanosis, clubbing, or edema Skin:   No  rashes, lesions, ulcers  palpation of skin: no induration or nodules Neurologic:   CN 2-12 intact  Sensation all 4 extremities intact Psychiatric:   Mental status ? Mood, affect appropriate ? Orientation to person, place, time   judgment and insight appear intact   Discharge Instructions  Discharge Instructions    Activity as tolerated - No restrictions    Complete by: As directed    Call MD for:  persistant nausea and vomiting   Complete by: As directed    Call MD for:  severe uncontrolled pain   Complete by: As directed    Call MD for:  temperature >100.4   Complete by: As directed    Diet - low sodium heart healthy   Complete by: As directed    Discharge instructions   Complete by: As directed    Avoid alcohol and fatty foods. Follow up with PCP in 7-10 days.   Increase activity slowly   Complete by: As directed      Allergies as of 10/24/2018      Reactions   Diclofenac Hives   Norvasc [amlodipine Besylate] Other (See Comments)   Fluid buildup in chest   Omeprazole Other (See Comments)   MD stopped due to pancreatitis   Prednisone Other (See Comments)   Mood swings      Medication List    STOP taking these medications   ondansetron 4 MG disintegrating tablet Commonly known as: Zofran ODT   telmisartan 40 MG tablet Commonly known as: MICARDIS     TAKE these medications   acetaminophen 325 MG tablet Commonly known as: TYLENOL Take 2 tablets (650 mg total) by mouth every 6 (six) hours as needed for mild pain (or Fever >/= 101).   famotidine 20 MG tablet Commonly known as: PEPCID Take 1 tablet (20 mg total) by mouth daily. Start taking on: October 25, 2018 What changed: when to take this   feeding supplement Liqd Take 1 Container by mouth 3 (three) times daily between meals.   lipase/protease/amylase 30865 UNITS Cpep capsule Commonly known as: Creon Take 1 capsule (36,000 Units total) by mouth See admin instructions. Take one capsules (36,000 units) by mouth with meals (2 meals daily) and one capsule (36000 units) with snacks daily   methocarbamol 500 MG tablet Commonly known as: ROBAXIN Take 1,000 mg by mouth 3 (three) times daily as needed for pain.   promethazine 25 MG tablet Commonly known as: PHENERGAN Take 1 tablet (25 mg total) by mouth every 6 (six) hours as needed for up to 5 days for nausea or  vomiting.      Allergies  Allergen Reactions  . Diclofenac Hives  . Norvasc [Amlodipine Besylate] Other (See Comments)    Fluid buildup in chest  . Omeprazole Other (See Comments)    MD stopped due to pancreatitis  . Prednisone Other (See Comments)    Mood swings    The results of significant diagnostics from this hospitalization (including imaging, microbiology, ancillary and laboratory) are listed below for reference.    Significant Diagnostic Studies: Ct Abdomen Pelvis Wo Contrast  Result Date: 10/18/2018 CLINICAL DATA:  Pancreatitis, acute, SIRS, elev WBC, fever EXAM: CT ABDOMEN AND PELVIS WITHOUT CONTRAST TECHNIQUE: Multidetector CT imaging of the abdomen and pelvis was performed following the standard protocol without IV contrast. COMPARISON:  Contrast enhanced CT 07/23/2018 FINDINGS: Lower chest: Linear atelectasis in both lower lobes. Left pleural thickening without frank effusion. Hepatobiliary: Generalized hepatic steatosis. Tiny subcentimeter lesions on prior contrast-enhanced exam  are not visualized in the absence of IV contrast. Postcholecystectomy without biliary dilatation. Pancreas: Peripancreatic fat stranding about the entire pancreas, most prominent about the body and tail. Small amount of peripancreatic fluid tracks in the left pericolic gutter and anterior left pararenal space. No well-defined peripancreatic fluid collection on noncontrast exam. No ductal dilatation. No pancreatic calcifications. Spleen: Normal in size without focal abnormality. Adrenals/Urinary Tract: Normal adrenal glands. Chronic marked right renal atrophy with small cysts. Compensatory hypertrophy of the left kidney. Nonobstructing stones in the lower left kidney. No hydronephrosis. Left ureter is decompressed without stone along the course. Urinary bladder is minimally distended. No bladder wall thickening. Stomach/Bowel: Ingested material in the stomach. No small bowel obstruction or inflammation.  Fluid-filled slightly prominent distal small bowel suggests degree of ileus. Normal appendix. Submucosal fatty infiltration of the ascending colon consistent with prior inflammation. Descending colon is decompressed, mild edema about the descending colon appears similar to prior and is likely reactive due tube pancreatic inflammation. Vascular/Lymphatic: Normal caliber abdominal aorta. Vascular patency cannot be assessed in the absence of IV contrast. Multiple prominent peripancreatic nodes are likely reactive. Reproductive: Prostate is unremarkable. Other: Free fluid tracks in the left pericolic gutter into the pelvis. No free air. No evidence of acute peripancreatic or other focal fluid collection. Musculoskeletal: Degenerative disc disease at L5-S1. There are no acute or suspicious osseous abnormalities. IMPRESSION: 1. Acute edematous pancreatitis with peripancreatic inflammation and small amount of free fluid. No well-defined peripancreatic fluid collection on noncontrast exam. 2. Chronic findings include hepatic steatosis and nonobstructing left nephrolithiasis. Chronic marked right renal atrophy. Electronically Signed   By: Narda Rutherford M.D.   On: 10/18/2018 19:53    Microbiology: Recent Results (from the past 240 hour(s))  Novel Coronavirus,NAA,(SEND-OUT TO REF LAB - TAT 24-48 hrs); Hosp Order     Status: None   Collection Time: 10/17/18  4:56 AM   Specimen: Nasopharyngeal Swab; Respiratory  Result Value Ref Range Status   SARS-CoV-2, NAA NOT DETECTED NOT DETECTED Final    Comment: (NOTE) This test was developed and its performance characteristics determined by [employer]. This test has not been FDA cleared or approved. This test has been authorized by FDA under an Emergency Use Authorization (EUA). This test is only authorized for the duration of time the declaration that circumstances exist justifying the authorization of the emergency use of in vitro diagnostic tests for  detection of SARS-CoV-2 virus and/or diagnosis of COVID-19 infection under section 564(b)(1) of the Act, 21 U.S.C. 161WRU-0(A)(5), unless the authorization is terminated or revoked sooner. When diagnostic testing is negative, the possibility of a false negative result should be considered in the context of a patient's recent exposures and the presence of clinical signs and symptoms consistent with COVID-19. An individual without symptoms of COVID-19 and who is not shedding SARS-CoV-2 virus would expect to have a negative (not detected) result in this assay. Performed  At: [facility] [address] [place], [place] [id number] Jolene Schimke MD NW:[id number]    Coronavirus Source NASOPHARYNGEAL  Final    Comment: Performed at [facility], [address], [place], [place] [address]     Labs: Basic Metabolic Panel: Recent Labs  Lab 10/18/18 0352 10/19/18 0304 10/23/18 0204  NA 137 136 137  K 3.9 3.8 3.7  CL 102 101 101  CO2 24 25 25   GLUCOSE 157* 108* 130*  BUN <5* <5* 8  CREATININE 1.05 1.13 1.00  CALCIUM 9.1 8.5* 9.1   Liver Function  Tests: Recent Labs  Lab 10/18/18 0352 10/19/18 0304 10/23/18 0204  AST 41 28 38  ALT 30 21 48*  ALKPHOS 58 49 80  BILITOT 0.9 1.3* 0.5  PROT 7.1 6.1* 6.4*  ALBUMIN 3.5 2.9* 2.9*   Recent Labs  Lab 10/18/18 0518 10/19/18 0304 10/20/18 0513 10/24/18 0256  LIPASE 646* 114* 94* 79*   No results for input(s): AMMONIA in the last 168 hours. CBC: Recent Labs  Lab 10/23/18 0204  WBC 5.5  NEUTROABS 2.7  HGB 11.5*  HCT 34.5*  MCV 97.5  PLT 218   Cardiac Enzymes: No results for input(s): CKTOTAL, CKMB, CKMBINDEX, TROPONINI in the last 168 hours. BNP: BNP (last 3 results) Recent Labs    01/20/18 2206  BNP 27.6    ProBNP (last 3 results) No results for input(s): PROBNP in the last 8760 hours.  CBG: Recent Labs  Lab 10/22/18 0805  GLUCAP 112*    Principal Problem:   Recurrent acute pancreatitis  Active Problems:   Essential hypertension   Hypokalemia   Nephrolithiasis   Microscopic hematuria   Time coordinating discharge: 38 minutes.  Signed:        Clarivel Callaway, DO [facility]  10/24/2018, 3:01 PM

## 2019-01-19 ENCOUNTER — Other Ambulatory Visit: Payer: Self-pay | Admitting: Nephrology

## 2019-01-19 DIAGNOSIS — R109 Unspecified abdominal pain: Secondary | ICD-10-CM

## 2019-01-22 ENCOUNTER — Other Ambulatory Visit: Payer: Managed Care, Other (non HMO)

## 2019-01-28 ENCOUNTER — Other Ambulatory Visit: Payer: Self-pay

## 2019-01-28 ENCOUNTER — Ambulatory Visit
Admission: RE | Admit: 2019-01-28 | Discharge: 2019-01-28 | Disposition: A | Discharge status: Home / Self Care | Payer: Managed Care, Other (non HMO) | Source: Ambulatory Visit | Attending: Nephrology | Admitting: Nephrology

## 2019-01-28 DIAGNOSIS — R109 Unspecified abdominal pain: Secondary | ICD-10-CM

## 2019-02-04 ENCOUNTER — Ambulatory Visit
Admission: RE | Admit: 2019-02-04 | Discharge: 2019-02-04 | Disposition: A | Discharge status: Home / Self Care | Payer: Managed Care, Other (non HMO) | Source: Ambulatory Visit | Attending: Nephrology | Admitting: Nephrology

## 2019-03-12 ENCOUNTER — Emergency Department (HOSPITAL_COMMUNITY)
Admission: EM | Admit: 2019-03-12 | Discharge: 2019-03-12 | Disposition: A | Discharge status: Home / Self Care | Payer: Managed Care, Other (non HMO) | Attending: Emergency Medicine | Admitting: Emergency Medicine

## 2019-03-12 ENCOUNTER — Encounter (HOSPITAL_COMMUNITY): Payer: Self-pay | Admitting: Emergency Medicine

## 2019-03-12 ENCOUNTER — Emergency Department (HOSPITAL_COMMUNITY): Payer: Managed Care, Other (non HMO)

## 2019-03-12 DIAGNOSIS — M549 Dorsalgia, unspecified: Secondary | ICD-10-CM | POA: Diagnosis present

## 2019-03-12 DIAGNOSIS — M79671 Pain in right foot: Secondary | ICD-10-CM | POA: Diagnosis not present

## 2019-03-12 DIAGNOSIS — I10 Essential (primary) hypertension: Secondary | ICD-10-CM | POA: Insufficient documentation

## 2019-03-12 DIAGNOSIS — Z79899 Other long term (current) drug therapy: Secondary | ICD-10-CM | POA: Diagnosis not present

## 2019-03-12 DIAGNOSIS — M545 Low back pain, unspecified: Secondary | ICD-10-CM

## 2019-03-12 LAB — CBC
HCT: 38.8 % — ABNORMAL LOW (ref 39.0–52.0)
Hemoglobin: 13.5 g/dL (ref 13.0–17.0)
MCH: 33.6 pg (ref 26.0–34.0)
MCHC: 34.8 g/dL (ref 30.0–36.0)
MCV: 96.5 fL (ref 80.0–100.0)
Platelets: 196 10*3/uL (ref 150–400)
RBC: 4.02 MIL/uL — ABNORMAL LOW (ref 4.22–5.81)
RDW: 12.9 % (ref 11.5–15.5)
WBC: 7 10*3/uL (ref 4.0–10.5)
nRBC: 0 % (ref 0.0–0.2)

## 2019-03-12 LAB — COMPREHENSIVE METABOLIC PANEL
ALT: 24 U/L (ref 0–44)
AST: 28 U/L (ref 15–41)
Albumin: 3.8 g/dL (ref 3.5–5.0)
Alkaline Phosphatase: 65 U/L (ref 38–126)
Anion gap: 12 (ref 5–15)
BUN: 14 mg/dL (ref 6–20)
CO2: 20 mmol/L — ABNORMAL LOW (ref 22–32)
Calcium: 9.4 mg/dL (ref 8.9–10.3)
Chloride: 102 mmol/L (ref 98–111)
Creatinine, Ser: 1.27 mg/dL — ABNORMAL HIGH (ref 0.61–1.24)
GFR calc Af Amer: >60 mL/min (ref >60)
GFR calc non Af Amer: >60 mL/min (ref >60)
Glucose, Bld: 111 mg/dL — ABNORMAL HIGH (ref 70–99)
Potassium: 3.8 mmol/L (ref 3.5–5.1)
Sodium: 134 mmol/L — ABNORMAL LOW (ref 135–145)
Total Bilirubin: 1 mg/dL (ref 0.3–1.2)
Total Protein: 7 g/dL (ref 6.5–8.1)

## 2019-03-12 LAB — URINALYSIS, ROUTINE W REFLEX MICROSCOPIC
Bacteria, UA: NONE SEEN
Bilirubin Urine: NEGATIVE
Glucose, UA: NEGATIVE mg/dL
Ketones, ur: 20 mg/dL — AB
Leukocytes,Ua: NEGATIVE
Nitrite: NEGATIVE
Protein, ur: NEGATIVE mg/dL
Specific Gravity, Urine: 1.021 (ref 1.005–1.030)
pH: 5 (ref 5.0–8.0)

## 2019-03-12 LAB — LIPASE, BLOOD: Lipase: 29 U/L (ref 11–51)

## 2019-03-12 MED ORDER — ONDANSETRON 4 MG PO TBDP: 4.0000 mg | ORAL_TABLET | Freq: Three times a day (TID) | ORAL | 0 refills | Status: DC | PRN

## 2019-03-12 MED ORDER — ONDANSETRON HCL 4 MG/2ML IJ SOLN
4.0000 mg | Freq: Once | INTRAMUSCULAR | Status: AC
  Administered 2019-03-12: 4 mg via INTRAVENOUS
  Filled 2019-03-12: qty 2

## 2019-03-12 MED ORDER — IBUPROFEN 800 MG PO TABS: 800.0000 mg | ORAL_TABLET | Freq: Three times a day (TID) | ORAL | 0 refills | Status: DC | PRN

## 2019-03-12 NOTE — ED Provider Notes (Signed)
[place] EMERGENCY DEPARTMENT Provider Note   CSN: [id number] Arrival date & time: 03/12/19  1029     History   Chief Complaint Chief Complaint  Patient presents with  . Back Pain    HPI Kyle Berry is a 48 y.o. male.     The history is provided by the patient. No language interpreter was used.  Back Pain Location:  Lumbar spine Quality:  Aching Radiates to:  Does not radiate Pain severity:  Moderate Pain is:  Same all the time Onset quality:  Gradual Timing:  Constant Progression:  Worsening Chronicity:  New Relieved by:  Nothing Worsened by:  Nothing Risk factors comment:  Hx of pancreatitis. Pt is worried that he is getting pancreatitis.  Pt also reports right foot hurts.   Past Medical History:  Diagnosis Date  . Acute pancreatitis 11/23/2015  . Chronic bronchitis ([place])   . Chronic lower back pain   . Congenital single kidney   . GERD (gastroesophageal reflux disease)   . Headache    "once/month" (05/13/2017)  . High cholesterol   . History of kidney stones   . Hypertension   . Migraine    "a couple/year" (05/13/2017)  . Nephrolithiasis 05/13/2017  . Pneumonia ~ 09/2015  . Recurrent acute pancreatitis   . Renal disorder     Patient Active Problem List   Diagnosis Date Noted  . Recurrent acute pancreatitis 10/17/2018  . Microscopic hematuria 10/17/2018  . Acute pancreatitis 07/23/2018  . Pancreatic divisum   . Congenital single kidney 05/13/2017  . High cholesterol 05/13/2017  . Nephrolithiasis 05/13/2017  . Acute recurrent pancreatitis   . Hypokalemia   . Essential hypertension   . Esophageal reflux   . Chronic pancreatitis ([place]) 11/23/2015    Past Surgical History:  Procedure Laterality Date  . CHOLECYSTECTOMY N/A 10/23/2017   Procedure: LAPAROSCOPIC CHOLECYSTECTOMY WITH INTRAOPERATIVE CHOLANGIOGRAM;  Surgeon: Johnathan Hausen, MD;  Location: [facility] ORS;  Service: General;  Laterality: N/A;  . NO PAST SURGERIES           Home Medications    Prior to Admission medications   Medication Sig Start Date End Date Taking? Authorizing Provider  lipase/protease/amylase (CREON) 36000 UNITS CPEP capsule Take 1 capsule (36,000 Units total) by mouth See admin instructions. Take one capsules (36,000 units) by mouth with meals (2 meals daily) and one capsule (36000 units) with snacks daily 10/14/17  Yes Emokpae, [name], MD  telmisartan (MICARDIS) 40 MG tablet Take 40 mg by mouth daily. 03/05/19  Yes [provider]  ibuprofen (ADVIL) 800 MG tablet Take 1 tablet (800 mg total) by mouth every 8 (eight) hours as needed. 03/12/19   Fransico Meadow, PA-C  ondansetron (ZOFRAN ODT) 4 MG disintegrating tablet Take 1 tablet (4 mg total) by mouth every 8 (eight) hours as needed for nausea or vomiting. 03/12/19   Fransico Meadow, PA-C    Family History Family History  Problem Relation Age of Onset  . Hypertension Other   . Hypertension Mother   . Hypertension Father   . Kidney disease Father   . Hypertension Sister   . Diabetes Sister   . Hypertension Brother     Social History Social History   Tobacco Use  . Smoking status: Never Smoker  . Smokeless tobacco: Never Used  Substance Use Topics  . Alcohol use: Not Currently    Comment: 05/13/2017 "might have beer a few times/year"  . Drug use: No     Allergies  Diclofenac, Norvasc [amlodipine besylate], Omeprazole, and Prednisone   Review of Systems Review of Systems  Musculoskeletal: Positive for back pain.  All other systems reviewed and are negative.    Physical Exam Updated Vital Signs BP (!) 159/99 (BP Location: Right Arm)   Pulse 86   Temp 99.6 F (37.6 C) (Oral)   Resp 18   Ht 5\' 8"  (1.727 m)   Wt 80.7 kg   SpO2 97%   BMI 27.06 kg/m   Physical Exam Vitals signs and nursing note reviewed.  Constitutional:      Appearance: He is well-developed.  HENT:     Head: Normocephalic and atraumatic.  Eyes:     Conjunctiva/sclera:  Conjunctivae normal.  Neck:     Musculoskeletal: Normal range of motion and neck supple.  Cardiovascular:     Rate and Rhythm: Normal rate and regular rhythm.     Heart sounds: No murmur.  Pulmonary:     Effort: Pulmonary effort is normal. No respiratory distress.     Breath sounds: Normal breath sounds.  Abdominal:     Palpations: Abdomen is soft.     Tenderness: There is no abdominal tenderness.  Musculoskeletal:        General: Tenderness present. No swelling.  Skin:    General: Skin is warm and dry.  Neurological:     General: No focal deficit present.     Mental Status: He is alert.  Psychiatric:        Mood and Affect: Mood normal.      ED Treatments / Results  Labs (all labs ordered are listed, but only abnormal results are displayed) Labs Reviewed  COMPREHENSIVE METABOLIC PANEL - Abnormal; Notable for the following components:      Result Value   Sodium 134 (*)    CO2 20 (*)    Glucose, Bld 111 (*)    Creatinine, Ser 1.27 (*)    All other components within normal limits  CBC - Abnormal; Notable for the following components:   RBC 4.02 (*)    HCT 38.8 (*)    All other components within normal limits  URINALYSIS, ROUTINE W REFLEX MICROSCOPIC - Abnormal; Notable for the following components:   Hgb urine dipstick SMALL (*)    Ketones, ur 20 (*)    All other components within normal limits  LIPASE, BLOOD    EKG None  Radiology Dg Foot Complete Right  Result Date: 03/12/2019 CLINICAL DATA:  Pain right third toe.  No injury. EXAM: RIGHT FOOT COMPLETE - 3+ VIEW COMPARISON:  None. FINDINGS: There is no evidence of fracture or dislocation. There is no evidence of arthropathy or other focal bone abnormality. Soft tissues are unremarkable. IMPRESSION: Negative. Electronically Signed   By: Marin Olp M.D.   On: 03/12/2019 12:20    Procedures Procedures (including critical care time)  Medications Ordered in ED Medications  ondansetron (ZOFRAN) injection 4 mg  (4 mg Intravenous Given 03/12/19 1628)     Initial Impression / Assessment and Plan / ED Course  I have reviewed the triage vital signs and the nursing notes.  Pertinent labs & imaging results that were available during my care of the patient were reviewed by me and considered in my medical decision making (see chart for details).        MDM  Lipase is normal.  Foot xray is normal.  Pt counseled on plantar fascitis and advised on exercises.  Pt advised no alcohol.  Ibuprofen for back pain, zofran  for nausea   Final Clinical Impressions(s) / ED Diagnoses   Final diagnoses:  Low back pain without sciatica, unspecified back pain laterality, unspecified chronicity  Foot pain, right    ED Discharge Orders         Ordered    ondansetron (ZOFRAN ODT) 4 MG disintegrating tablet  Every 8 hours PRN     03/12/19 1634    ibuprofen (ADVIL) 800 MG tablet  Every 8 hours PRN     03/12/19 1634        An After Visit Summary was printed and given to the patient.    Fransico Meadow, [place] 03/12/19 1642    Carmin Muskrat, MD 03/13/19 [phone]

## 2019-03-12 NOTE — ED Notes (Signed)
Patient transported to X-ray 

## 2019-03-12 NOTE — Discharge Instructions (Signed)
See your Physician for recheck.  Return if any problems.  °

## 2019-03-12 NOTE — ED Notes (Signed)
ED Provider at bedside. 

## 2019-03-12 NOTE — ED Notes (Addendum)
Aware of need for urine sample, urinal at bedside 

## 2019-03-12 NOTE — ED Triage Notes (Signed)
Pt arrives to ED with low back pain and concerned due to decreased urine output and pt states he only has 1 kidney. Pt also has pain to his right 3rd toe. No injury- no wound or swelling noted.

## 2019-05-01 ENCOUNTER — Other Ambulatory Visit: Payer: Self-pay

## 2019-05-01 ENCOUNTER — Ambulatory Visit (INDEPENDENT_AMBULATORY_CARE_PROVIDER_SITE_OTHER): Payer: Managed Care, Other (non HMO)

## 2019-05-01 ENCOUNTER — Ambulatory Visit (INDEPENDENT_AMBULATORY_CARE_PROVIDER_SITE_OTHER): Payer: Managed Care, Other (non HMO) | Admitting: Podiatry

## 2019-05-01 ENCOUNTER — Other Ambulatory Visit: Payer: Self-pay | Admitting: Podiatry

## 2019-05-01 DIAGNOSIS — M2042 Other hammer toe(s) (acquired), left foot: Secondary | ICD-10-CM

## 2019-05-01 DIAGNOSIS — D3610 Benign neoplasm of peripheral nerves and autonomic nervous system, unspecified: Secondary | ICD-10-CM | POA: Diagnosis not present

## 2019-05-01 DIAGNOSIS — M2041 Other hammer toe(s) (acquired), right foot: Secondary | ICD-10-CM

## 2019-05-01 DIAGNOSIS — M7742 Metatarsalgia, left foot: Secondary | ICD-10-CM

## 2019-05-01 DIAGNOSIS — M7741 Metatarsalgia, right foot: Secondary | ICD-10-CM

## 2019-05-01 DIAGNOSIS — G588 Other specified mononeuropathies: Secondary | ICD-10-CM | POA: Diagnosis not present

## 2019-05-01 DIAGNOSIS — M79671 Pain in right foot: Secondary | ICD-10-CM

## 2019-05-01 DIAGNOSIS — M79672 Pain in left foot: Secondary | ICD-10-CM

## 2019-05-01 NOTE — Progress Notes (Signed)
  Subjective:  Patient ID: Kyle Berry, male    DOB: Oct 19, 1970,  MRN: [id number]  Chief Complaint  Patient presents with  . Foot Pain    Bilateral - burning pain. Entire foot. x1 yr. Pt stated, "I have the pain at night and while I walk at work. It's even worse when I sit down after walking for awhile - 10/10".    49 y.o. male presents with the above complaint. History confirmed with patient.   Objective:  Physical Exam: warm, good capillary refill, no trophic changes or ulcerative lesions, normal DP and PT pulses and normal sensory exam. Left Foot: tenderness between the 2nd and 3rd metatarsal head and hammertoe deformity bilat with soft corns 3rd toe  Right Foot: tenderness between the 2nd and 3rd metatarsal head and hammertoe deformity bilat with soft corns 3rd toe  No images are attached to the encounter.  Radiographs: X-ray of both feet: no fracture, dislocation, swelling or degenerative changes noted and decreased 2nd/3rd IMA   Assessment:   1. Right foot pain   2. Left foot pain     Plan:  Patient was evaluated and treated and all questions answered.  Hammertoe and Interdigital Neuroma -Educated on etiology -Educated on padding and proper shoegear -XR reviewed with patient -Injection delivered to the affected interspaces  Procedure: Neuroma Injection Location: Bilateral 2nd interspace Skin Prep: Alcohol. Injectate: 0.5 cc 0.5% marcaine plain, 0.5 cc dexamethasone phosphate. Disposition: Patient tolerated procedure well. Injection site dressed with a band-aid.  Return in about 3 weeks (around 05/22/2019) for Neuroma, Bilateral; hammertoes .

## 2019-05-22 ENCOUNTER — Other Ambulatory Visit: Payer: Self-pay

## 2019-05-22 ENCOUNTER — Ambulatory Visit (INDEPENDENT_AMBULATORY_CARE_PROVIDER_SITE_OTHER): Payer: Managed Care, Other (non HMO) | Admitting: Podiatry

## 2019-05-22 DIAGNOSIS — G5763 Lesion of plantar nerve, bilateral lower limbs: Secondary | ICD-10-CM | POA: Diagnosis not present

## 2019-05-22 DIAGNOSIS — G588 Other specified mononeuropathies: Secondary | ICD-10-CM

## 2019-05-27 ENCOUNTER — Observation Stay (HOSPITAL_COMMUNITY): Payer: Managed Care, Other (non HMO)

## 2019-05-27 ENCOUNTER — Encounter (HOSPITAL_COMMUNITY): Payer: Self-pay | Admitting: Emergency Medicine

## 2019-05-27 ENCOUNTER — Inpatient Hospital Stay (HOSPITAL_COMMUNITY)
Admission: EM | Admit: 2019-05-27 | Discharge: 2019-06-03 | DRG: 439 | Disposition: A | Discharge status: Home / Self Care | Payer: Managed Care, Other (non HMO) | Attending: Internal Medicine | Admitting: Internal Medicine

## 2019-05-27 ENCOUNTER — Other Ambulatory Visit: Payer: Self-pay

## 2019-05-27 DIAGNOSIS — Z833 Family history of diabetes mellitus: Secondary | ICD-10-CM

## 2019-05-27 DIAGNOSIS — K859 Acute pancreatitis without necrosis or infection, unspecified: Secondary | ICD-10-CM | POA: Diagnosis present

## 2019-05-27 DIAGNOSIS — Z79899 Other long term (current) drug therapy: Secondary | ICD-10-CM

## 2019-05-27 DIAGNOSIS — Z20822 Contact with and (suspected) exposure to covid-19: Secondary | ICD-10-CM | POA: Diagnosis present

## 2019-05-27 DIAGNOSIS — R112 Nausea with vomiting, unspecified: Secondary | ICD-10-CM | POA: Diagnosis present

## 2019-05-27 DIAGNOSIS — K8500 Idiopathic acute pancreatitis without necrosis or infection: Principal | ICD-10-CM | POA: Diagnosis present

## 2019-05-27 DIAGNOSIS — K8689 Other specified diseases of pancreas: Secondary | ICD-10-CM | POA: Diagnosis present

## 2019-05-27 DIAGNOSIS — G8929 Other chronic pain: Secondary | ICD-10-CM | POA: Diagnosis present

## 2019-05-27 DIAGNOSIS — N2881 Hypertrophy of kidney: Secondary | ICD-10-CM | POA: Diagnosis present

## 2019-05-27 DIAGNOSIS — R1013 Epigastric pain: Secondary | ICD-10-CM

## 2019-05-27 DIAGNOSIS — Q600 Renal agenesis, unilateral: Secondary | ICD-10-CM

## 2019-05-27 DIAGNOSIS — Z8249 Family history of ischemic heart disease and other diseases of the circulatory system: Secondary | ICD-10-CM

## 2019-05-27 DIAGNOSIS — E7800 Pure hypercholesterolemia, unspecified: Secondary | ICD-10-CM | POA: Diagnosis present

## 2019-05-27 DIAGNOSIS — Z8719 Personal history of other diseases of the digestive system: Secondary | ICD-10-CM

## 2019-05-27 DIAGNOSIS — K8681 Exocrine pancreatic insufficiency: Secondary | ICD-10-CM | POA: Diagnosis present

## 2019-05-27 DIAGNOSIS — Z841 Family history of disorders of kidney and ureter: Secondary | ICD-10-CM

## 2019-05-27 DIAGNOSIS — I10 Essential (primary) hypertension: Secondary | ICD-10-CM | POA: Diagnosis present

## 2019-05-27 DIAGNOSIS — Q453 Other congenital malformations of pancreas and pancreatic duct: Secondary | ICD-10-CM

## 2019-05-27 DIAGNOSIS — K219 Gastro-esophageal reflux disease without esophagitis: Secondary | ICD-10-CM | POA: Diagnosis present

## 2019-05-27 DIAGNOSIS — E785 Hyperlipidemia, unspecified: Secondary | ICD-10-CM | POA: Diagnosis present

## 2019-05-27 DIAGNOSIS — Z888 Allergy status to other drugs, medicaments and biological substances status: Secondary | ICD-10-CM

## 2019-05-27 DIAGNOSIS — Z87442 Personal history of urinary calculi: Secondary | ICD-10-CM

## 2019-05-27 DIAGNOSIS — K567 Ileus, unspecified: Secondary | ICD-10-CM | POA: Diagnosis not present

## 2019-05-27 DIAGNOSIS — M545 Low back pain: Secondary | ICD-10-CM | POA: Diagnosis present

## 2019-05-27 DIAGNOSIS — E860 Dehydration: Secondary | ICD-10-CM | POA: Diagnosis present

## 2019-05-27 DIAGNOSIS — R1084 Generalized abdominal pain: Secondary | ICD-10-CM

## 2019-05-27 DIAGNOSIS — K861 Other chronic pancreatitis: Secondary | ICD-10-CM | POA: Diagnosis present

## 2019-05-27 DIAGNOSIS — J42 Unspecified chronic bronchitis: Secondary | ICD-10-CM | POA: Diagnosis present

## 2019-05-27 DIAGNOSIS — K828 Other specified diseases of gallbladder: Secondary | ICD-10-CM | POA: Diagnosis present

## 2019-05-27 DIAGNOSIS — Z9049 Acquired absence of other specified parts of digestive tract: Secondary | ICD-10-CM

## 2019-05-27 LAB — URINALYSIS, ROUTINE W REFLEX MICROSCOPIC
Bilirubin Urine: NEGATIVE
Glucose, UA: NEGATIVE mg/dL
Hgb urine dipstick: NEGATIVE
Ketones, ur: NEGATIVE mg/dL
Leukocytes,Ua: NEGATIVE
Nitrite: NEGATIVE
Protein, ur: NEGATIVE mg/dL
Specific Gravity, Urine: 1.009 (ref 1.005–1.030)
pH: 5 (ref 5.0–8.0)

## 2019-05-27 LAB — CBC
HCT: 43.3 % (ref 39.0–52.0)
Hemoglobin: 14.4 g/dL (ref 13.0–17.0)
MCH: 31.9 pg (ref 26.0–34.0)
MCHC: 33.3 g/dL (ref 30.0–36.0)
MCV: 95.8 fL (ref 80.0–100.0)
Platelets: 215 10*3/uL (ref 150–400)
RBC: 4.52 MIL/uL (ref 4.22–5.81)
RDW: 13.1 % (ref 11.5–15.5)
WBC: 8.3 10*3/uL (ref 4.0–10.5)
nRBC: 0 % (ref 0.0–0.2)

## 2019-05-27 LAB — COMPREHENSIVE METABOLIC PANEL
ALT: 39 U/L (ref 0–44)
AST: 33 U/L (ref 15–41)
Albumin: 3.9 g/dL (ref 3.5–5.0)
Alkaline Phosphatase: 51 U/L (ref 38–126)
Anion gap: 13 (ref 5–15)
BUN: 13 mg/dL (ref 6–20)
CO2: 23 mmol/L (ref 22–32)
Calcium: 9.5 mg/dL (ref 8.9–10.3)
Chloride: 103 mmol/L (ref 98–111)
Creatinine, Ser: 1.24 mg/dL (ref 0.61–1.24)
GFR calc Af Amer: >60 mL/min (ref >60)
GFR calc non Af Amer: >60 mL/min (ref >60)
Glucose, Bld: 106 mg/dL — ABNORMAL HIGH (ref 70–99)
Potassium: 3.8 mmol/L (ref 3.5–5.1)
Sodium: 139 mmol/L (ref 135–145)
Total Bilirubin: 0.9 mg/dL (ref 0.3–1.2)
Total Protein: 7.1 g/dL (ref 6.5–8.1)

## 2019-05-27 LAB — AMYLASE: Amylase: 66 U/L (ref 28–100)

## 2019-05-27 LAB — HIV ANTIBODY (ROUTINE TESTING W REFLEX): HIV Screen 4th Generation wRfx: NONREACTIVE

## 2019-05-27 LAB — LIPASE, BLOOD: Lipase: 76 U/L — ABNORMAL HIGH (ref 11–51)

## 2019-05-27 LAB — SARS CORONAVIRUS 2 (TAT 6-24 HRS): SARS Coronavirus 2: NEGATIVE

## 2019-05-27 MED ORDER — ONDANSETRON HCL 4 MG PO TABS: 4.0000 mg | ORAL_TABLET | Freq: Four times a day (QID) | ORAL | Status: DC | PRN

## 2019-05-27 MED ORDER — ACETAMINOPHEN 325 MG PO TABS
650.0000 mg | ORAL_TABLET | Freq: Four times a day (QID) | ORAL | Status: DC | PRN
  Administered 2019-05-31: 650 mg via ORAL
  Filled 2019-05-27: qty 2

## 2019-05-27 MED ORDER — ACETAMINOPHEN 650 MG RE SUPP: 650.0000 mg | Freq: Four times a day (QID) | RECTAL | Status: DC | PRN

## 2019-05-27 MED ORDER — OXYCODONE-ACETAMINOPHEN 5-325 MG PO TABS
1.0000 | ORAL_TABLET | Freq: Once | ORAL | Status: AC
  Administered 2019-05-27: 1 via ORAL
  Filled 2019-05-27: qty 1

## 2019-05-27 MED ORDER — HYDROMORPHONE HCL 1 MG/ML IJ SOLN
1.0000 mg | Freq: Once | INTRAMUSCULAR | Status: AC
  Administered 2019-05-27: 09:00:00 1 mg via INTRAVENOUS
  Filled 2019-05-27: qty 1

## 2019-05-27 MED ORDER — HYDROMORPHONE HCL 1 MG/ML IJ SOLN
1.0000 mg | INTRAMUSCULAR | Status: DC | PRN
  Administered 2019-05-28 (×2): 1 mg via INTRAVENOUS
  Filled 2019-05-27 (×2): qty 1

## 2019-05-27 MED ORDER — HYDRALAZINE HCL 25 MG PO TABS: 25.0000 mg | ORAL_TABLET | Freq: Four times a day (QID) | ORAL | Status: DC | PRN

## 2019-05-27 MED ORDER — PROCHLORPERAZINE EDISYLATE 10 MG/2ML IJ SOLN
5.0000 mg | Freq: Once | INTRAMUSCULAR | Status: AC
  Administered 2019-05-27: 23:00:00 5 mg via INTRAVENOUS
  Filled 2019-05-27: qty 2

## 2019-05-27 MED ORDER — HYDROMORPHONE HCL 1 MG/ML IJ SOLN
0.5000 mg | INTRAMUSCULAR | Status: DC | PRN
  Administered 2019-05-27: 16:00:00 0.5 mg via INTRAVENOUS
  Filled 2019-05-27: qty 1

## 2019-05-27 MED ORDER — KETOROLAC TROMETHAMINE 30 MG/ML IJ SOLN
30.0000 mg | Freq: Four times a day (QID) | INTRAMUSCULAR | Status: DC | PRN
  Administered 2019-05-27: 30 mg via INTRAVENOUS
  Filled 2019-05-27: qty 1

## 2019-05-27 MED ORDER — SODIUM CHLORIDE 0.9% FLUSH: 3.0000 mL | Freq: Once | INTRAVENOUS | Status: DC

## 2019-05-27 MED ORDER — HYDROMORPHONE HCL 1 MG/ML IJ SOLN
1.0000 mg | INTRAMUSCULAR | Status: DC | PRN
  Administered 2019-05-27: 20:00:00 1 mg via INTRAVENOUS
  Filled 2019-05-27: qty 1

## 2019-05-27 MED ORDER — FENTANYL CITRATE (PF) 100 MCG/2ML IJ SOLN
50.0000 ug | Freq: Once | INTRAMUSCULAR | Status: AC
  Administered 2019-05-27: 08:00:00 50 ug via INTRAVENOUS
  Filled 2019-05-27: qty 2

## 2019-05-27 MED ORDER — PANCRELIPASE (LIP-PROT-AMYL) 36000-114000 UNITS PO CPEP
36000.0000 [IU] | ORAL_CAPSULE | ORAL | Status: DC
  Administered 2019-05-27 – 2019-05-28 (×2): 36000 [IU] via ORAL
  Filled 2019-05-27 (×3): qty 1

## 2019-05-27 MED ORDER — PROMETHAZINE HCL 25 MG/ML IJ SOLN
12.5000 mg | Freq: Once | INTRAMUSCULAR | Status: AC
  Administered 2019-05-27: 12.5 mg via INTRAVENOUS
  Filled 2019-05-27: qty 1

## 2019-05-27 MED ORDER — HYDROMORPHONE HCL 1 MG/ML IJ SOLN
1.0000 mg | Freq: Once | INTRAMUSCULAR | Status: AC
  Administered 2019-05-27: 1 mg via INTRAVENOUS
  Filled 2019-05-27: qty 1

## 2019-05-27 MED ORDER — OXYCODONE-ACETAMINOPHEN 5-325 MG PO TABS
1.0000 | ORAL_TABLET | Freq: Four times a day (QID) | ORAL | Status: DC | PRN
  Administered 2019-05-27 – 2019-06-03 (×7): 1 via ORAL
  Filled 2019-05-27 (×8): qty 1

## 2019-05-27 MED ORDER — IRBESARTAN 150 MG PO TABS
150.0000 mg | ORAL_TABLET | Freq: Every day | ORAL | Status: DC
  Administered 2019-05-28: 150 mg via ORAL
  Filled 2019-05-27: qty 1

## 2019-05-27 MED ORDER — KETOROLAC TROMETHAMINE 30 MG/ML IJ SOLN: 30.0000 mg | Freq: Four times a day (QID) | INTRAMUSCULAR | Status: DC | PRN

## 2019-05-27 MED ORDER — ONDANSETRON HCL 4 MG/2ML IJ SOLN
4.0000 mg | Freq: Four times a day (QID) | INTRAMUSCULAR | Status: DC | PRN
  Administered 2019-05-27: 4 mg via INTRAVENOUS
  Filled 2019-05-27: qty 2

## 2019-05-27 MED ORDER — ONDANSETRON HCL 4 MG/2ML IJ SOLN
4.0000 mg | Freq: Once | INTRAMUSCULAR | Status: AC
  Administered 2019-05-27: 4 mg via INTRAVENOUS
  Filled 2019-05-27: qty 2

## 2019-05-27 MED ORDER — SODIUM CHLORIDE 0.9 % IV SOLN: INTRAVENOUS | Status: DC

## 2019-05-27 MED ORDER — SODIUM CHLORIDE 0.9 % IV BOLUS
1000.0000 mL | Freq: Once | INTRAVENOUS | Status: AC
  Administered 2019-05-27: 08:00:00 1000 mL via INTRAVENOUS

## 2019-05-27 MED ORDER — PANCRELIPASE (LIP-PROT-AMYL) 36000-114000 UNITS PO CPEP
36000.0000 [IU] | ORAL_CAPSULE | Freq: Two times a day (BID) | ORAL | Status: DC
  Administered 2019-05-27 – 2019-05-28 (×2): 36000 [IU] via ORAL
  Filled 2019-05-27 (×2): qty 1

## 2019-05-27 NOTE — ED Triage Notes (Signed)
Patient reports upper abdominal pain with emesis and diarrhea onset this morning , history of pancreatitis , denies fever or chills .

## 2019-05-27 NOTE — ED Notes (Signed)
Pt transported to ultrasound.

## 2019-05-27 NOTE — Consult Note (Addendum)
Referring Provider:  [facility]         Primary Care Physician:  Kyle Berry (Inactive) Primary Gastroenterologist:  Denzil Magnuson, MD            We were asked to see this patient for:  Abdominal pain / hx of  pancreatitis               ASSESSMENT /  PLAN    1. 49 yo male with upper abdominal pain / vomiting reminiscent of pancreatiits of which he has had several episodes over last few years. Etiology never really determined ( Etoh, ACE inhibitor, pancreatic divisum, biliary origin?). This is first episode of significant pain since empiric cholecystectomy in 2019 ( he has had a few minor episodes of pain since).  With atrophied pancreas and dilated PD he probably has chronic pancreatitis. Suspect pain / vomiting is secondary to acute on chronic pancreatitis.  -VSS, labs unremarkable. Supportive care for now. If doesn't improve with supportive care may need CT scan.  -sips of clears  -IVF, 75 ml / hr should suffice -when diet advanced he will need to resume pancreatic enzymes.    2. HTN, BP elevated. DBP in upper 90's.   3. Marked right renal atrophy on imaging.   Data reviewed:   CBC normal.  Hct 43 Lipase 75, amylase 66 Liver tests normal.  Renal function normal.   HPI:    Chief Complaint: pancreatitis  Kyle Berry is a 49 y.o. male with a history of recurrent pancreatitis. It was thought initially to be due to [facility] but after recurrent episodes while abstinent it was thought ACE inhibitor may have been culprit. IfG 4 for autoimmune pancreatitis was normal. MRCP in 2018 raised concern for pancreatic divisum.  No gallstones. He underwent empiric cholecystectomy in July 2019. There were chronic inflammatory adhesions to most of the gallbladder. He did well without abdominal pain for several months. Lately having episodes of pain which he manages by putting himself on clear liquids and taking pain medication.  Last week he had some nausea, back pain and upper  abdominal pain. Initially thought pain related to work and it eventually went away. He awoke in the middle of the night with the same upper abdominal pain radiating through to his back with associated nausea / vomiting. The pain is intermittent, worse with PO intake. He felt chilly but didn't take temperature. Bowel movements at baseline alternating between loose and solid.   [name] has not consumed Etoh in a couple of years. He very occasionally takes Motrin. He is compliant with pancreatic enzymes.  Takes Micardis and also zofran as needed. No other medications.   Labs in ED fairly unremarkable. Lipase 76. U/A negative. [place] >>> 9 mm CBD, maybe parenchymal atrophy of pancreas. Mild PD dilation at 4 mm.    Past Medical History:  Diagnosis Date  . Acute pancreatitis 11/23/2015  . Chronic bronchitis ([place])   . Chronic lower back pain   . Congenital single kidney   . GERD (gastroesophageal reflux disease)   . Headache    "once/month" (05/13/2017)  . High cholesterol   . History of kidney stones   . Hypertension   . Migraine    "a couple/year" (05/13/2017)  . Nephrolithiasis 05/13/2017  . Pneumonia ~ 09/2015  . Recurrent acute pancreatitis   . Renal disorder     Past Surgical History:  Procedure Laterality Date  . CHOLECYSTECTOMY N/A 10/23/2017   Procedure: LAPAROSCOPIC CHOLECYSTECTOMY WITH INTRAOPERATIVE CHOLANGIOGRAM;  Surgeon: Johnathan Hausen, MD;  Location: [facility];  Service: General;  Laterality: N/A;  . NO PAST SURGERIES      Prior to Admission medications   Medication Sig Start Date End Date Taking? Authorizing Provider  ibuprofen (ADVIL) 800 MG tablet Take 1 tablet (800 mg total) by mouth every 8 (eight) hours as needed. 03/12/19  Yes Fransico Meadow, PA-C  lipase/protease/amylase (CREON) 36000 UNITS CPEP capsule Take 1 capsule (36,000 Units total) by mouth See admin instructions. Take one capsules (36,000 units) by mouth with meals (2 meals daily) and one capsule (36000 units)  with snacks daily 10/14/17  Yes Emokpae, [name], MD  ondansetron (ZOFRAN ODT) 4 MG disintegrating tablet Take 1 tablet (4 mg total) by mouth every 8 (eight) hours as needed for nausea or vomiting. 03/12/19  Yes Caryl Ada K, PA-C  telmisartan (MICARDIS) 40 MG tablet Take 40 mg by mouth daily. 03/05/19  Yes [provider]    Current Facility-Administered Medications  Medication Dose Route Frequency Provider Last Rate Last Admin  . 0.9 %  sodium chloride infusion   Intravenous Continuous Wynetta Fines T, MD      . acetaminophen (TYLENOL) tablet 650 mg  650 mg Oral Q6H PRN Wynetta Fines T, MD      . hydrALAZINE (APRESOLINE) tablet 25 mg  25 mg Oral Q6H PRN Wynetta Fines T, MD      . HYDROmorphone (DILAUDID) injection 0.5 mg  0.5 mg Intravenous Q4H PRN Wynetta Fines T, MD      . Derrill Memo ON 05/28/2019] irbesartan (AVAPRO) tablet 150 mg  150 mg Oral Daily Wynetta Fines T, MD      . ketorolac (TORADOL) 30 MG/ML injection 30 mg  30 mg Intravenous Q6H PRN Wynetta Fines T, MD      . lipase/protease/amylase (CREON) capsule 36,000 Units  36,000 Units Oral BID WC Wynetta Fines T, MD      . lipase/protease/amylase (CREON) capsule 36,000 Units  36,000 Units Oral With snacks Wynetta Fines T, MD      . ondansetron [facility]) tablet 4 mg  4 mg Oral Q6H PRN Wynetta Fines T, MD       Or  . ondansetron [facility]) injection 4 mg  4 mg Intravenous Q6H PRN Wynetta Fines T, MD      . sodium chloride flush (NS) 0.9 % injection 3 mL  3 mL Intravenous Once Davonna Belling, MD        Allergies as of 05/27/2019 - Review Complete 05/27/2019  Allergen Reaction Noted  . Diclofenac Hives 12/14/2013  . Norvasc [amlodipine besylate] Other (See Comments) 08/15/2016  . Omeprazole Other (See Comments) 08/15/2016  . Prednisone Other (See Comments) 12/14/2013    Family History  Problem Relation Age of Onset  . Hypertension Other   . Hypertension Mother   . Hypertension Father   . Kidney disease Father   . Hypertension Sister   .  Diabetes Sister   . Hypertension Brother     Social History   Socioeconomic History  . Marital status: Single    Spouse name: Not on file  . Number of children: Not on file  . Years of education: Not on file  . Highest education level: Not on file  Occupational History  . Not on file  Tobacco Use  . Smoking status: Never Smoker  . Smokeless tobacco: Never Used  Substance and Sexual Activity  . Alcohol use: Not Currently    Comment: 05/13/2017 "might have beer a few times/year"  .  Drug use: No  . Sexual activity: Yes    Birth control/protection: Condom  Other Topics Concern  . Not on file  Social History Narrative  . Not on file   Social Determinants of Health   Financial Resource Strain:   . Difficulty of Paying Living Expenses: Not on file  Food Insecurity:   . Worried About Charity fundraiser in the Last Year: Not on file  . Ran Out of Food in the Last Year: Not on file  Transportation Needs:   . Lack of Transportation (Medical): Not on file  . Lack of Transportation (Non-Medical): Not on file  Physical Activity:   . Days of Exercise per Week: Not on file  . Minutes of Exercise per Session: Not on file  Stress:   . Feeling of Stress : Not on file  Social Connections:   . Frequency of Communication with Friends and Family: Not on file  . Frequency of Social Gatherings with Friends and Family: Not on file  . Attends Religious Services: Not on file  . Active Member of Clubs or Organizations: Not on file  . Attends Archivist Meetings: Not on file  . Marital Status: Not on file  Intimate Partner Violence:   . Fear of Current or Ex-Partner: Not on file  . Emotionally Abused: Not on file  . Physically Abused: Not on file  . Sexually Abused: Not on file    Review of Systems: All systems reviewed and negative except where noted in HPI.  Physical Exam: Vital signs in last 24 hours: Temp:  [97.8 F (36.6 C)-98 F (36.7 C)] 98 F (36.7 C) (02/10  1442) Pulse Rate:  [63-89] 82 (02/10 1442) Resp:  [16] 16 (02/10 1442) BP: (135-172)/(90-107) 172/99 (02/10 1442) SpO2:  [96 %-98 %] 98 % (02/10 1442)   General:   Alert, well-developed,  male in NAD Psych:  Pleasant, cooperative. Normal mood and affect. Eyes:  Pupils equal, sclera clear, no icterus.   Conjunctiva pink. Ears:  Normal auditory acuity. Nose:  No deformity, discharge,  or lesions. Neck:  Supple; no masses Lungs:  Clear throughout to auscultation.   No wheezes, crackles, or rhonchi.  Heart:  Regular rate and rhythm; no murmurs, no lower extremity edema Abdomen:  Soft, non-distended, mild- mod LUQ / epigastric tenderness. BS active, no palp mass   Rectal:  Deferred  Msk:  Symmetrical without gross deformities. . Neurologic:  Alert and  oriented x4;  grossly normal neurologically. Skin:  Intact without significant lesions or rashes.   Intake/Output from previous day: No intake/output data recorded. Intake/Output this shift: Total I/O In: 1000 [IV Piggyback:1000] Out: 300 [Urine:300]  Lab Results: Recent Labs    05/27/19 0726  WBC 8.3  HGB 14.4  HCT 43.3  PLT 215   BMET Recent Labs    05/27/19 0726  NA 139  K 3.8  CL 103  CO2 23  GLUCOSE 106*  BUN 13  CREATININE 1.24  CALCIUM 9.5   LFT Recent Labs    05/27/19 0726  PROT 7.1  ALBUMIN 3.9  AST 33  ALT 39  ALKPHOS 51  BILITOT 0.9   PT/INR No results for input(s): LABPROT, INR in the last 72 hours. Hepatitis Panel No results for input(s): HEPBSAG, HCVAB, HEPAIGM, HEPBIGM in the last 72 hours.   . CBC Latest Ref Rng & Units 05/27/2019 03/12/2019 10/23/2018  WBC 4.0 - 10.5 K/uL 8.3 7.0 5.5  Hemoglobin 13.0 - 17.0 g/dL 14.4 13.5 11.5(L)  Hematocrit 39.0 - 52.0 % 43.3 38.8(L) 34.5(L)  Platelets 150 - 400 K/uL 215 196 218    . CMP Latest Ref Rng & Units 05/27/2019 03/12/2019 10/23/2018  Glucose 70 - 99 mg/dL 106(H) 111(H) 130(H)  BUN 6 - 20 mg/dL 13 14 8   Creatinine 0.61 - 1.24 mg/dL 1.24  1.27(H) 1.00  Sodium 135 - 145 mmol/L 139 134(L) 137  Potassium 3.5 - 5.1 mmol/L 3.8 3.8 3.7  Chloride 98 - 111 mmol/L 103 102 101  CO2 22 - 32 mmol/L 23 20(L) 25  Calcium 8.9 - 10.3 mg/dL 9.5 9.4 9.1  Total Protein 6.5 - 8.1 g/dL 7.1 7.0 6.4(L)  Total Bilirubin 0.3 - 1.2 mg/dL 0.9 1.0 0.5  Alkaline Phos 38 - 126 U/L 51 65 80  AST 15 - 41 U/L 33 28 38  ALT 0 - 44 U/L 39 24 48(H)   Studies/Results: US Abdomen Complete  Result Date: 05/27/2019 CLINICAL DATA:  Acute pancreatitis. EXAM: ABDOMEN ULTRASOUND COMPLETE COMPARISON:  Abdominal CT 02/04/2019, MRCP 04/09/2018 FINDINGS: Gallbladder: Surgically absent. Common bile duct: Diameter: 9 mm, normal for postcholecystectomy state. Liver: No focal lesion identified. Within normal limits in parenchymal echogenicity. Portal vein is patent on color Doppler imaging with normal direction of blood flow towards the liver. IVC: No abnormality visualized. Pancreas: Not well visualized sonographically. Suggestion of parenchymal atrophy. There is mild dilatation of the pancreatic duct measuring 4 mm. No obvious peripancreatic fluid collection. Spleen: Size and appearance within normal limits. Right Kidney: Not visualized. Prior imaging demonstrated markedly atrophic kidney, this is not well apparent sonographically. Left Kidney: Length: 14.4 cm. 5 mm nonobstructing calculus in the lower kidney. Echogenicity within normal limits. No mass or hydronephrosis visualized. Abdominal aorta: No aneurysm visualized. Other findings: No free fluid visualized. IMPRESSION: 1. Pancreas is not well-defined sonographically. Mild dilatation of the pancreatic duct of 4 mm. No sonographic evidence of focal fluid collection or free fluid in the abdomen. 2. Post cholecystectomy. Mild biliary prominence, normal postcholecystectomy. 3. Right kidney not visualized, markedly atrophic on prior imaging. Mild compensatory left renal hypertrophy. Nonobstructing stone in the lower left kidney.  Electronically Signed   By: Keith Rake M.D.   On: 05/27/2019 14:09    Active Problems:   Recurrent pancreatitis   Acute pancreatitis   Recurrent acute pancreatitis    Tye Savoy, NP-C @  05/27/2019, 2:45 PM   I have reviewed the entire case in detail with the above APP and discussed the plan in detail.  Therefore, I agree with the diagnoses recorded above. In addition,  I have personally interviewed and examined the patient and have personally reviewed any abdominal/pelvic CT scan images.  My additional thoughts are as follows:  He has a flare of his pancreatitis, acute on chronic.  The underlying cause of his pancreatitis still has not been elucidated.  He is known to have pancreas divisum, and at this point it seems that should be desiccated further.  He describes this episode as the worst he has had since 2019.  Its been going on for 5 or 6 days at home, which might account for his lipase of only 72 on admission here.  He is certainly nontoxic, looks well hydrated.  His main issue seems to be pain control, which is being worked on.  He is on IV fluids, clear liquid diet.  He  He has not alcohol in years, does not smoke, and takes 1 pancreatic enzyme capsule with each meal or snack, and does not have  chronic diarrhea, weight has been stable.  When he recovers from this episode, we will arrange follow-up in clinic with Dr. Silverio Decamp, his primary GI physician with our practice.  Consideration should then be given to referral to an academic center for further work-up regarding the pancreas divisum, i.e. secretin stimulation MR study.   Nelida Meuse III Office:[phone]

## 2019-05-27 NOTE — ED Notes (Signed)
Urinal bedside  

## 2019-05-27 NOTE — ED Notes (Signed)
DR. Alvino Chapel at bedside.

## 2019-05-27 NOTE — H&P (Signed)
History and Physical    Kyle Berry [id number] DOB: 1970-08-09 DOA: 05/27/2019  PCP: [name] (Inactive)   Patient coming from:  Home I have personally briefly reviewed patient's old medical records in [place]  Chief Complaint: Belly hurts  HPI: Kyle Berry is a 49 y.o. male with medical history significant of recurrent pancreatitis  Secondary to pancrease divisum, chronic bronchitis, chronic lower back pain, congenital single kidney, nephrolithiasis, GERD, history of  cholecystectomy, hyperlipidemia, hypertension presented to the hospital for evaluation of worsening of aching like abdominal pain, vomiting. His symptoms started about 64-5 days ago located around epigastric abdominal pain which radiates to his back, very similar to his prior pancreatitis episodes.  Episodic like, from yesterday the abdominal pain started to get worse, around 2:00 this morning he woke up with severe abdominal pain, with strong feeling nauseous and vomit 5-6 times, no fever chills.  And he has had multiple episodes of loose bowel movement for last 2 days. He has not used alcohol for about 2 years.    No chest pain, short of breath, dysuria, no hematuria. ED Course: Mild elevation of lipase level, no WBC count, no change of LFTs.  Repeat multiple IV pain meds, but still failed diet challenge.  Review of Systems: As per HPI otherwise 10 point review of systems negative.    Past Medical History:  Diagnosis Date  . Acute pancreatitis 11/23/2015  . Chronic bronchitis ([place])   . Chronic lower back pain   . Congenital single kidney   . GERD (gastroesophageal reflux disease)   . Headache    "once/month" (05/13/2017)  . High cholesterol   . History of kidney stones   . Hypertension   . Migraine    "a couple/year" (05/13/2017)  . Nephrolithiasis 05/13/2017  . Pneumonia ~ 09/2015  . Recurrent acute pancreatitis   . Renal disorder     Past Surgical History:  Procedure  Laterality Date  . CHOLECYSTECTOMY N/A 10/23/2017   Procedure: LAPAROSCOPIC CHOLECYSTECTOMY WITH INTRAOPERATIVE CHOLANGIOGRAM;  Surgeon: Johnathan Hausen, MD;  Location: [facility];  Service: General;  Laterality: N/A;  . NO PAST SURGERIES       reports that he has never smoked. He has never used smokeless tobacco. He reports previous alcohol use. He reports that he does not use drugs.  Allergies  Allergen Reactions  . Diclofenac Hives  . Norvasc [Amlodipine Besylate] Other (See Comments)    Fluid buildup in chest  . Omeprazole Other (See Comments)    MD stopped due to pancreatitis  . Prednisone Other (See Comments)    Mood swings    Family History  Problem Relation Age of Onset  . Hypertension Other   . Hypertension Mother   . Hypertension Father   . Kidney disease Father   . Hypertension Sister   . Diabetes Sister   . Hypertension Brother      Prior to Admission medications   Medication Sig Start Date End Date Taking? Authorizing Provider  ibuprofen (ADVIL) 800 MG tablet Take 1 tablet (800 mg total) by mouth every 8 (eight) hours as needed. 03/12/19  Yes Fransico Meadow, PA-C  lipase/protease/amylase (CREON) 36000 UNITS CPEP capsule Take 1 capsule (36,000 Units total) by mouth See admin instructions. Take one capsules (36,000 units) by mouth with meals (2 meals daily) and one capsule (36000 units) with snacks daily 10/14/17  Yes Emokpae, [name], MD  ondansetron (ZOFRAN ODT) 4 MG disintegrating tablet Take 1 tablet (4 mg total) by mouth  every 8 (eight) hours as needed for nausea or vomiting. 03/12/19  Yes Caryl Ada K, PA-C  telmisartan (MICARDIS) 40 MG tablet Take 40 mg by mouth daily. 03/05/19  Yes [provider]    Physical Exam: Vitals:   05/27/19 1000 05/27/19 1100 05/27/19 1145 05/27/19 1200  BP: (!) 162/90 (!) 144/94 (!) 158/98 (!) 161/98  Pulse: 73 77 70 88  Resp:      Temp:      TempSrc:      SpO2: 98% 97% 97% 98%    Constitutional: NAD, calm,  comfortable Vitals:   05/27/19 1000 05/27/19 1100 05/27/19 1145 05/27/19 1200  BP: (!) 162/90 (!) 144/94 (!) 158/98 (!) 161/98  Pulse: 73 77 70 88  Resp:      Temp:      TempSrc:      SpO2: 98% 97% 97% 98%   Eyes: PERRL, lids and conjunctivae normal ENMT: Mucous membranes are moist. Posterior pharynx clear of any exudate or lesions.Normal dentition.  Neck: normal, supple, no masses, no thyromegaly Respiratory: clear to auscultation bilaterally, no wheezing, no crackles. Normal respiratory effort. No accessory muscle use.  Cardiovascular: Regular rate and rhythm, no murmurs / rubs / gallops. No extremity edema. 2+ pedal pulses. No carotid bruits.  Abdomen: Mild tenderness on deep palpation on epigastric area, no rebound no guarding,. No hepatosplenomegaly. Bowel sounds positive.  Musculoskeletal: no clubbing / cyanosis. No joint deformity upper and lower extremities. Good ROM, no contractures. Normal muscle tone.  Skin: no rashes, lesions, ulcers. No induration Neurologic: CN 2-12 grossly intact. Sensation intact, DTR normal. Strength 5/5 in all 4.  Psychiatric: Normal judgment and insight. Alert and oriented x 3. Normal mood.     Labs on Admission: I have personally reviewed following labs and imaging studies  CBC: Recent Labs  Lab 05/27/19 0726  WBC 8.3  HGB 14.4  HCT 43.3  MCV 95.8  PLT 123456   Basic Metabolic Panel: Recent Labs  Lab 05/27/19 0726  NA 139  K 3.8  CL 103  CO2 23  GLUCOSE 106*  BUN 13  CREATININE 1.24  CALCIUM 9.5   GFR: CrCl cannot be calculated (Unknown ideal weight.). Liver Function Tests: Recent Labs  Lab 05/27/19 0726  AST 33  ALT 39  ALKPHOS 51  BILITOT 0.9  PROT 7.1  ALBUMIN 3.9   Recent Labs  Lab 05/27/19 0726  LIPASE 76*  AMYLASE 66   No results for input(s): AMMONIA in the last 168 hours. Coagulation Profile: No results for input(s): INR, PROTIME in the last 168 hours. Cardiac Enzymes: No results for input(s): CKTOTAL,  CKMB, CKMBINDEX, TROPONINI in the last 168 hours. BNP (last 3 results) No results for input(s): PROBNP in the last 8760 hours. HbA1C: No results for input(s): HGBA1C in the last 72 hours. CBG: No results for input(s): GLUCAP in the last 168 hours. Lipid Profile: No results for input(s): CHOL, HDL, LDLCALC, TRIG, CHOLHDL, LDLDIRECT in the last 72 hours. Thyroid Function Tests: No results for input(s): TSH, T4TOTAL, FREET4, T3FREE, THYROIDAB in the last 72 hours. Anemia Panel: No results for input(s): VITAMINB12, FOLATE, FERRITIN, TIBC, IRON, RETICCTPCT in the last 72 hours. Urine analysis:    Component Value Date/Time   COLORURINE STRAW (A) 05/27/2019 1225   APPEARANCEUR CLEAR 05/27/2019 1225   LABSPEC 1.009 05/27/2019 1225   PHURINE 5.0 05/27/2019 1225   GLUCOSEU NEGATIVE 05/27/2019 1225   HGBUR NEGATIVE 05/27/2019 [place] 05/27/2019 [place] 05/27/2019  [place] 05/27/2019 1225   UROBILINOGEN 0.2 01/20/2015 1643   NITRITE NEGATIVE 05/27/2019 [place] 05/27/2019 1225    Radiological Exams on Admission: No results found.  EKG: Ordered  Assessment/Plan Active Problems:   Acute pancreatitis   Recurrent acute pancreatitis  Recurrent pancreatitis Suspect related with pancreatic Divisum. Patient was last seen by GI July 2019, and he had two more episode of recurrent pancreatitis in April and July 2020, but she was not able to follow-up with GI specialist.  According to the patient, there was a discussion about surgery. Patient did not tolerate diet in the ED, will place patient on MedSurg observation. Symptomatic management IV fluids and pain meds, trial of liquid diet. Likely can be discharged in 1 to 2 days once pain is better controlled.  Uncontrolled hypertension Likely from abdominal pain Continue home meds, add as needed hydralazine.  Chronic pancrease insufficiency Continue Creon    DVT  prophylaxis:SCD Code Status: Full Family Communication: None at bedside Disposition Plan: Home once pain controlled. Consults called: [facility] paged Admission status: MedSurg Obs  Lequita Halt MD [facility] Pager [phone]    05/27/2019, 1:05 PM

## 2019-05-27 NOTE — ED Provider Notes (Signed)
[facility] EMERGENCY DEPARTMENT Provider Note   CSN: [id number] Arrival date & time: 05/27/19  0701     History Chief Complaint  Patient presents with  . Pancreatitis Flare Up    Kyle Berry is a 49 y.o. male.  HPI Patient presents with upper abdominal pain.  Began last night.  Has had vomiting and diarrhea.  History of pancreatitis.  States this feels the same.  Pain is in left upper abdomen.  Had an idiopathic pancreatitis previously.  Had been drinking alcohol at that time but has not drank in a year to now.  Pain is dull.  Decreased appetite.  Does go to the back.  No fevers.  No sick contacts.  States he did have some URI symptoms a few weeks ago.    Past Medical History:  Diagnosis Date  . Acute pancreatitis 11/23/2015  . Chronic bronchitis ([place])   . Chronic lower back pain   . Congenital single kidney   . GERD (gastroesophageal reflux disease)   . Headache    "once/month" (05/13/2017)  . High cholesterol   . History of kidney stones   . Hypertension   . Migraine    "a couple/year" (05/13/2017)  . Nephrolithiasis 05/13/2017  . Pneumonia ~ 09/2015  . Recurrent acute pancreatitis   . Renal disorder     Patient Active Problem List   Diagnosis Date Noted  . Recurrent acute pancreatitis 10/17/2018  . Microscopic hematuria 10/17/2018  . Acute pancreatitis 07/23/2018  . Pancreatic divisum   . Congenital single kidney 05/13/2017  . High cholesterol 05/13/2017  . Nephrolithiasis 05/13/2017  . Acute recurrent pancreatitis   . Hypokalemia   . Essential hypertension   . Esophageal reflux   . Chronic pancreatitis ([place]) 11/23/2015    Past Surgical History:  Procedure Laterality Date  . CHOLECYSTECTOMY N/A 10/23/2017   Procedure: LAPAROSCOPIC CHOLECYSTECTOMY WITH INTRAOPERATIVE CHOLANGIOGRAM;  Surgeon: Johnathan Hausen, MD;  Location: [facility];  Service: General;  Laterality: N/A;  . NO PAST SURGERIES         Family History  Problem Relation  Age of Onset  . Hypertension Other   . Hypertension Mother   . Hypertension Father   . Kidney disease Father   . Hypertension Sister   . Diabetes Sister   . Hypertension Brother     Social History   Tobacco Use  . Smoking status: Never Smoker  . Smokeless tobacco: Never Used  Substance Use Topics  . Alcohol use: Not Currently    Comment: 05/13/2017 "might have beer a few times/year"  . Drug use: No    Home Medications Prior to Admission medications   Medication Sig Start Date End Date Taking? Authorizing Provider  ibuprofen (ADVIL) 800 MG tablet Take 1 tablet (800 mg total) by mouth every 8 (eight) hours as needed. 03/12/19  Yes Fransico Meadow, PA-C  lipase/protease/amylase (CREON) 36000 UNITS CPEP capsule Take 1 capsule (36,000 Units total) by mouth See admin instructions. Take one capsules (36,000 units) by mouth with meals (2 meals daily) and one capsule (36000 units) with snacks daily 10/14/17  Yes Emokpae, [name], MD  ondansetron (ZOFRAN ODT) 4 MG disintegrating tablet Take 1 tablet (4 mg total) by mouth every 8 (eight) hours as needed for nausea or vomiting. 03/12/19  Yes Caryl Ada K, PA-C  telmisartan (MICARDIS) 40 MG tablet Take 40 mg by mouth daily. 03/05/19  Yes [provider]    Allergies    Diclofenac, Norvasc [amlodipine besylate], Omeprazole,  and Prednisone  Review of Systems   Review of Systems  Constitutional: Positive for appetite change. Negative for fever.  HENT: Negative for congestion.   Gastrointestinal: Positive for abdominal pain, diarrhea, nausea and vomiting.  Genitourinary: Negative for frequency.  Musculoskeletal: Negative for back pain.  Skin: Negative for rash.  Neurological: Negative for weakness.    Physical Exam Updated Vital Signs BP (!) 161/98   Pulse 88   Temp 97.8 F (36.6 C) (Oral)   Resp 16   SpO2 98%   Physical Exam Vitals and nursing note reviewed.  HENT:     Head: Normocephalic.  Eyes:     General: No  scleral icterus. Cardiovascular:     Rate and Rhythm: Regular rhythm.  Pulmonary:     Breath sounds: No wheezing or rhonchi.  Abdominal:     Tenderness: There is abdominal tenderness.     Comments: Epigastric to left upper quadrant tenderness out rebound or guarding.  Musculoskeletal:     Right lower leg: No edema.     Left lower leg: No edema.  Skin:    General: Skin is warm.     Capillary Refill: Capillary refill takes less than 2 seconds.  Neurological:     Mental Status: He is alert and oriented to person, place, and time.  Psychiatric:        Mood and Affect: Mood normal.     ED Results / Procedures / Treatments   Labs (all labs ordered are listed, but only abnormal results are displayed) Labs Reviewed  LIPASE, BLOOD - Abnormal; Notable for the following components:      Result Value   Lipase 76 (*)    All other components within normal limits  COMPREHENSIVE METABOLIC PANEL - Abnormal; Notable for the following components:   Glucose, Bld 106 (*)    All other components within normal limits  CBC  AMYLASE  URINALYSIS, ROUTINE W REFLEX MICROSCOPIC    EKG None  Radiology No results found.  Procedures Procedures (including critical care time)  Medications Ordered in ED Medications  sodium chloride flush (NS) 0.9 % injection 3 mL (has no administration in time range)  sodium chloride 0.9 % bolus 1,000 mL (0 mLs Intravenous Stopped 05/27/19 0928)  fentaNYL (SUBLIMAZE) injection 50 mcg (50 mcg Intravenous Given 05/27/19 0747)  ondansetron (ZOFRAN) injection 4 mg (4 mg Intravenous Given 05/27/19 0748)  HYDROmorphone (DILAUDID) injection 1 mg (1 mg Intravenous Given 05/27/19 0857)  oxyCODONE-acetaminophen (PERCOCET/ROXICET) 5-325 MG per tablet 1 tablet (1 tablet Oral Given 05/27/19 0857)  HYDROmorphone (DILAUDID) injection 1 mg (1 mg Intravenous Given 05/27/19 1211)  promethazine (PHENERGAN) injection 12.5 mg (12.5 mg Intravenous Given 05/27/19 1211)    ED Course  I  have reviewed the triage vital signs and the nursing notes.  Pertinent labs & imaging results that were available during my care of the patient were reviewed by me and considered in my medical decision making (see chart for details).    MDM Rules/Calculators/A&P                      Patient abdominal pain.  History of pancreatitis.  Has been recurrent.  Pain began last night.  Nausea and vomiting.  Lipase is elevated from normal but still not 2-3 times normal range.  However patient continues have pain.  Not tolerating orals.  Amylase is reassuring.  At this point I think is likely a flare of his pancreatitis.  Feels if he would benefit from  [place] to the hospital since he is had antiemetics and pain medicine here with continued vomiting with trial of orals.  Will admit to unassigned medicine Final Clinical Impression(s) / ED Diagnoses Final diagnoses:  Recurrent pancreatitis    Rx / DC Orders ED Discharge Orders    None       Davonna Belling, MD 05/27/19 1234

## 2019-05-28 DIAGNOSIS — Z79899 Other long term (current) drug therapy: Secondary | ICD-10-CM | POA: Diagnosis not present

## 2019-05-28 DIAGNOSIS — Q453 Other congenital malformations of pancreas and pancreatic duct: Secondary | ICD-10-CM

## 2019-05-28 DIAGNOSIS — M545 Low back pain: Secondary | ICD-10-CM | POA: Diagnosis present

## 2019-05-28 DIAGNOSIS — K861 Other chronic pancreatitis: Secondary | ICD-10-CM | POA: Diagnosis present

## 2019-05-28 DIAGNOSIS — K828 Other specified diseases of gallbladder: Secondary | ICD-10-CM | POA: Diagnosis present

## 2019-05-28 DIAGNOSIS — K219 Gastro-esophageal reflux disease without esophagitis: Secondary | ICD-10-CM | POA: Diagnosis present

## 2019-05-28 DIAGNOSIS — Z20822 Contact with and (suspected) exposure to covid-19: Secondary | ICD-10-CM | POA: Diagnosis present

## 2019-05-28 DIAGNOSIS — K8681 Exocrine pancreatic insufficiency: Secondary | ICD-10-CM | POA: Diagnosis present

## 2019-05-28 DIAGNOSIS — E7800 Pure hypercholesterolemia, unspecified: Secondary | ICD-10-CM | POA: Diagnosis present

## 2019-05-28 DIAGNOSIS — K8500 Idiopathic acute pancreatitis without necrosis or infection: Secondary | ICD-10-CM | POA: Diagnosis present

## 2019-05-28 DIAGNOSIS — Q600 Renal agenesis, unilateral: Secondary | ICD-10-CM | POA: Diagnosis not present

## 2019-05-28 DIAGNOSIS — J42 Unspecified chronic bronchitis: Secondary | ICD-10-CM | POA: Diagnosis present

## 2019-05-28 DIAGNOSIS — Z8249 Family history of ischemic heart disease and other diseases of the circulatory system: Secondary | ICD-10-CM | POA: Diagnosis not present

## 2019-05-28 DIAGNOSIS — Z9049 Acquired absence of other specified parts of digestive tract: Secondary | ICD-10-CM | POA: Diagnosis not present

## 2019-05-28 DIAGNOSIS — E785 Hyperlipidemia, unspecified: Secondary | ICD-10-CM | POA: Diagnosis present

## 2019-05-28 DIAGNOSIS — K8689 Other specified diseases of pancreas: Secondary | ICD-10-CM | POA: Diagnosis present

## 2019-05-28 DIAGNOSIS — Z833 Family history of diabetes mellitus: Secondary | ICD-10-CM | POA: Diagnosis not present

## 2019-05-28 DIAGNOSIS — Z87442 Personal history of urinary calculi: Secondary | ICD-10-CM | POA: Diagnosis not present

## 2019-05-28 DIAGNOSIS — K567 Ileus, unspecified: Secondary | ICD-10-CM | POA: Diagnosis not present

## 2019-05-28 DIAGNOSIS — N2881 Hypertrophy of kidney: Secondary | ICD-10-CM | POA: Diagnosis present

## 2019-05-28 DIAGNOSIS — G8929 Other chronic pain: Secondary | ICD-10-CM | POA: Diagnosis present

## 2019-05-28 DIAGNOSIS — I10 Essential (primary) hypertension: Secondary | ICD-10-CM | POA: Diagnosis present

## 2019-05-28 DIAGNOSIS — R1084 Generalized abdominal pain: Secondary | ICD-10-CM

## 2019-05-28 DIAGNOSIS — Z888 Allergy status to other drugs, medicaments and biological substances status: Secondary | ICD-10-CM | POA: Diagnosis not present

## 2019-05-28 DIAGNOSIS — R112 Nausea with vomiting, unspecified: Secondary | ICD-10-CM | POA: Diagnosis not present

## 2019-05-28 DIAGNOSIS — K859 Acute pancreatitis without necrosis or infection, unspecified: Secondary | ICD-10-CM | POA: Diagnosis present

## 2019-05-28 DIAGNOSIS — Z841 Family history of disorders of kidney and ureter: Secondary | ICD-10-CM | POA: Diagnosis not present

## 2019-05-28 DIAGNOSIS — E860 Dehydration: Secondary | ICD-10-CM | POA: Diagnosis present

## 2019-05-28 DIAGNOSIS — R1013 Epigastric pain: Secondary | ICD-10-CM | POA: Diagnosis not present

## 2019-05-28 LAB — COMPREHENSIVE METABOLIC PANEL
ALT: 43 U/L (ref 0–44)
AST: 42 U/L — ABNORMAL HIGH (ref 15–41)
Albumin: 3.2 g/dL — ABNORMAL LOW (ref 3.5–5.0)
Alkaline Phosphatase: 63 U/L (ref 38–126)
Anion gap: 7 (ref 5–15)
BUN: 6 mg/dL (ref 6–20)
CO2: 27 mmol/L (ref 22–32)
Calcium: 8.5 mg/dL — ABNORMAL LOW (ref 8.9–10.3)
Chloride: 102 mmol/L (ref 98–111)
Creatinine, Ser: 1.07 mg/dL (ref 0.61–1.24)
GFR calc Af Amer: >60 mL/min (ref >60)
GFR calc non Af Amer: >60 mL/min (ref >60)
Glucose, Bld: 113 mg/dL — ABNORMAL HIGH (ref 70–99)
Potassium: 4 mmol/L (ref 3.5–5.1)
Sodium: 136 mmol/L (ref 135–145)
Total Bilirubin: 1.4 mg/dL — ABNORMAL HIGH (ref 0.3–1.2)
Total Protein: 6.1 g/dL — ABNORMAL LOW (ref 6.5–8.1)

## 2019-05-28 LAB — CBC
HCT: 38.9 % — ABNORMAL LOW (ref 39.0–52.0)
Hemoglobin: 13 g/dL (ref 13.0–17.0)
MCH: 32.5 pg (ref 26.0–34.0)
MCHC: 33.4 g/dL (ref 30.0–36.0)
MCV: 97.3 fL (ref 80.0–100.0)
Platelets: 188 10*3/uL (ref 150–400)
RBC: 4 MIL/uL — ABNORMAL LOW (ref 4.22–5.81)
RDW: 13 % (ref 11.5–15.5)
WBC: 8.5 10*3/uL (ref 4.0–10.5)
nRBC: 0 % (ref 0.0–0.2)

## 2019-05-28 LAB — SEDIMENTATION RATE: Sed Rate: 11 mm/hr (ref 0–16)

## 2019-05-28 LAB — LIPASE, BLOOD: Lipase: 345 U/L — ABNORMAL HIGH (ref 11–51)

## 2019-05-28 LAB — C-REACTIVE PROTEIN: CRP: 6.6 mg/dL — ABNORMAL HIGH (ref <1.0)

## 2019-05-28 MED ORDER — HYDRALAZINE HCL 20 MG/ML IJ SOLN: 5.0000 mg | INTRAMUSCULAR | Status: DC | PRN

## 2019-05-28 MED ORDER — HYDROMORPHONE HCL 1 MG/ML IJ SOLN
1.0000 mg | INTRAMUSCULAR | Status: DC | PRN
  Administered 2019-05-28 – 2019-05-31 (×28): 1 mg via INTRAVENOUS
  Filled 2019-05-28 (×29): qty 1

## 2019-05-28 MED ORDER — LACTATED RINGERS IV BOLUS
1000.0000 mL | Freq: Once | INTRAVENOUS | Status: AC
  Administered 2019-05-28: 1000 mL via INTRAVENOUS

## 2019-05-28 MED ORDER — PROCHLORPERAZINE EDISYLATE 10 MG/2ML IJ SOLN
10.0000 mg | INTRAMUSCULAR | Status: DC | PRN
  Administered 2019-05-28 – 2019-05-29 (×4): 10 mg via INTRAVENOUS
  Filled 2019-05-28 (×4): qty 2

## 2019-05-28 MED ORDER — LACTATED RINGERS IV SOLN: INTRAVENOUS | Status: AC

## 2019-05-28 NOTE — Progress Notes (Addendum)
Progress Note    Kyle Berry  [id number] DOB: [birth year]  DOA: 05/27/2019 PCP: [name] (Inactive)    Brief Narrative:   Chief complaint: abdominal pain/nausea and vomiting  Medical records reviewed and are as summarized below:  Kyle Berry is a very pleasant 49 y.o. 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  Work-up revealed acute on chronic pancreatitis.  Evaluated by GI who recommend supportive therapy and follow-up with his primary gastroenterologist for consideration of referral to an academic center. Assessment/Plan:   Principal Problem:   Acute on chronic pancreatitis (HCC) Active Problems:   Nausea and vomiting   Pancreatic divisum   Essential hypertension   Esophageal reflux   High cholesterol   Pancreatic insufficiency   Congenital single kidney  #1.  Acute on chronic pancreatitis.  Patient was provided with clear liquids and IV fluids at 75 cc an hour.  Initially his lipase was 74. This morning he complains of worsening abdominal pain nausea with 3 episodes of emesis that appears to be undigested liquids he has consumed. -Discontinue clear liquids and make patient n.p.o. -Increase IV fluid rate to 125 cc an hour -Discontinue Zofran as patient reports historically this medication has not been helpful -Provide Compazine as needed -Dilaudid -Holding his home medications of Creon -Monitor  2.  Hypertension.  Poor control.  Home meds include ARB -As needed hydralazine IV given his n.p.o. status -Improved pain control -Monitor closely  #3.  Pancreatic insufficiency.  Likely related to above.  Medications include Creon -See #1 -Holding Creon for now  #4.   Pancreatic divisum.  Chart review indicates patient had an episode of pancreatitis in April and July of last year.  He reports he was not able to follow-up with his primary gastroenterologist but does report at that time there was some discussion about surgery.  He was evaluated by gastroenterology in the emergency department who recommends that once he recovers from this episode may want to consider referral to an academic center for further work-up regarding pancreas divisum specifically secretin stimulation MR study  #5.  Congenital single kidney.  Creatinine 1.2 on admission.  Imaging shows mild compensatory left renal hypertrophy -Avoid nephrotoxins -Monitor intake and output -Recheck in the morning   Family Communication/Anticipated D/C date and plan/Code Status   DVT prophylaxis: Lovenox ordered. Code Status: Full Code.  Family Communication: patient  Disposition Plan: from home, needs continued IVF and pain control via IV-- not ready for d/c-- will change to inpatient   Medical Consultants:   [facility]   Anti-Infectives:   None  Subjective:   Patient is awake alert in obvious pain.  Reports 3 episodes of emesis during the night.  He saved one of the emesis bags that was halfway full and appears to be undigested liquids that he has been consuming.  He reports the pain is worse than yesterday as is the nausea and vomiting.  Objective:    Vitals:   05/27/19 1442 05/27/19 1752 05/28/19 0009 05/28/19 0539  BP: (!) 172/99 (!) 164/93 132/82 (!) 168/98  Pulse: 82 78 72 66  Resp: 16 18 18 18   Temp: 98 F (36.7 C) (!) 97.4 F (36.3 C) 98.1 F (36.7 C) 97.9 F (36.6 C)  TempSrc: Oral Oral Oral Oral  SpO2: 98%  96% 96% 96%    Intake/Output Summary (Last 24 hours) at 05/28/2019 0932 Last data filed at 05/28/2019 0300 Gross per 24 hour  Intake 1272.36 ml  Output 300 ml  Net 972.36 ml   There were no vitals filed for this visit.  Exam: General: Awake alert  obviously uncomfortable but in no acute distress CV: Regular rate and rhythm I hear no murmur gallop or rub no lower extremity edema Respiratory: No increased work of breathing breath sounds are clear bilaterally to auscultation.  I hear no wheezes no crackles no rhonchi Abdomen: Nondistended, soft positive bowel sounds throughout mild to moderate tenderness in the epigastric and left upper quadrant. Musculoskeletal joints without swelling/erythema moves all extremities spontaneously  Data Reviewed:   I have personally reviewed following labs and imaging studies:  Labs: Labs show the following:   Basic Metabolic Panel: Recent Labs  Lab 05/27/19 0726  NA 139  K 3.8  CL 103  CO2 23  GLUCOSE 106*  BUN 13  CREATININE 1.24  CALCIUM 9.5   GFR CrCl cannot be calculated (Unknown ideal weight.). Liver Function Tests: Recent Labs  Lab 05/27/19 0726  AST 33  ALT 39  ALKPHOS 51  BILITOT 0.9  PROT 7.1  ALBUMIN 3.9   Recent Labs  Lab 05/27/19 0726 05/28/19 0137  LIPASE 76* 345*  AMYLASE 66  --    No results for input(s): AMMONIA in the last 168 hours. Coagulation profile No results for input(s): INR, PROTIME in the last 168 hours.  CBC: Recent Labs  Lab 05/27/19 0726  WBC 8.3  HGB 14.4  HCT 43.3  MCV 95.8  PLT 215   Cardiac Enzymes: No results for input(s): CKTOTAL, CKMB, CKMBINDEX, TROPONINI in the last 168 hours. BNP (last 3 results) No results for input(s): PROBNP in the last 8760 hours. CBG: No results for input(s): GLUCAP in the last 168 hours. D-Dimer: No results for input(s): DDIMER in the last 72 hours. Hgb A1c: No results for input(s): HGBA1C in the last 72 hours. Lipid Profile: No results for input(s): CHOL, HDL, LDLCALC, TRIG, CHOLHDL, LDLDIRECT in the last 72 hours. Thyroid function studies: No results for input(s): TSH, T4TOTAL, T3FREE, THYROIDAB in the last 72 hours.  Invalid input(s): FREET3 Anemia work up: No results for input(s):  VITAMINB12, FOLATE, FERRITIN, TIBC, IRON, RETICCTPCT in the last 72 hours. Sepsis Labs: Recent Labs  Lab 05/27/19 0726  WBC 8.3    Microbiology Recent Results (from the past 240 hour(s))  SARS CORONAVIRUS 2 (TAT 6-24 HRS) Nasopharyngeal Nasopharyngeal Swab     Status: None   Collection Time: 05/27/19  1:31 PM   Specimen: Nasopharyngeal Swab  Result Value Ref Range Status   SARS Coronavirus 2 NEGATIVE NEGATIVE Final    Comment: (NOTE) SARS-CoV-2 target nucleic acids are NOT DETECTED. The SARS-CoV-2 RNA is generally detectable in upper and lower respiratory specimens during the acute phase of infection. Negative results do not preclude SARS-CoV-2 infection, do not rule out co-infections with other pathogens, and should not be used as the sole basis for treatment or other patient management decisions. Negative results must be combined with clinical observations, patient history, and epidemiological information. The expected result is Negative. Fact Sheet for Patients: SugarRoll.be Fact Sheet for Healthcare Providers: https://www.woods-mathews.com/ This test is not yet approved or cleared by the [place] FDA and  has been authorized for detection and/or diagnosis of SARS-CoV-2 by FDA under an Emergency Use Authorization (EUA). This EUA will remain  in effect (meaning this  test can be used) for the duration of the COVID-19 declaration under Section 56 4(b)(1) of the Act, 21 U.S.C. section 360bbb-3(b)(1), unless the authorization is terminated or revoked sooner. Performed at [facility], [place] [address], [place], [place] [address]     Procedures and diagnostic studies:  US Abdomen Complete  Result Date: 05/27/2019 CLINICAL DATA:  Acute pancreatitis. EXAM: ABDOMEN ULTRASOUND COMPLETE COMPARISON:  Abdominal CT 02/04/2019, MRCP 04/09/2018 FINDINGS: Gallbladder: Surgically absent. Common bile duct: Diameter: 9 mm, normal for  postcholecystectomy state. Liver: No focal lesion identified. Within normal limits in parenchymal echogenicity. Portal vein is patent on color Doppler imaging with normal direction of blood flow towards the liver. IVC: No abnormality visualized. Pancreas: Not well visualized sonographically. Suggestion of parenchymal atrophy. There is mild dilatation of the pancreatic duct measuring 4 mm. No obvious peripancreatic fluid collection. Spleen: Size and appearance within normal limits. Right Kidney: Not visualized. Prior imaging demonstrated markedly atrophic kidney, this is not well apparent sonographically. Left Kidney: Length: 14.4 cm. 5 mm nonobstructing calculus in the lower kidney. Echogenicity within normal limits. No mass or hydronephrosis visualized. Abdominal aorta: No aneurysm visualized. Other findings: No free fluid visualized. IMPRESSION: 1. Pancreas is not well-defined sonographically. Mild dilatation of the pancreatic duct of 4 mm. No sonographic evidence of focal fluid collection or free fluid in the abdomen. 2. Post cholecystectomy. Mild biliary prominence, normal postcholecystectomy. 3. Right kidney not visualized, markedly atrophic on prior imaging. Mild compensatory left renal hypertrophy. Nonobstructing stone in the lower left kidney. Electronically Signed   By: Keith Rake M.D.   On: 05/27/2019 14:09    Medications:    sodium chloride flush  3 mL Intravenous Once   Continuous Infusions:  sodium chloride 125 mL/hr at 05/28/19 0912     LOS: 0 days   Radene Gunning NP [facility]   How to contact the [facility] Attending or Consulting provider [place] or covering provider during after hours [place], for this patient?  Check the care team in [facility] and look for a) attending/consulting [facility] provider listed and b) the [facility] team listed Log into [email or web address] and use [place]'s universal password to access. If you do not have the password, please contact the hospital operator. Locate  the [facility] provider you are looking for under [facility] and page to a number that you can be directly reached. If you still have difficulty reaching the provider, please page the [facility] (Director on Call) for the Hospitalists listed on amion for assistance.  05/28/2019, 9:32 AM

## 2019-05-28 NOTE — Progress Notes (Signed)
Progress Note    ASSESSMENT AND PLAN:   Acute on chronic pancreatitis. Etiology of pancreatitis elusive, maybe secondary to pancreatic divisum. Lipase up from 70's to 345 today. Having more pain, nausea/ vomiting today compared to yesterday -may benefit from evaluation of the pancreatic divisum but this would be done as outpatient.  - Made NPO, pain meds increased and IVF rate increased by [facility] earlier today. Says Zofran doesn't help, Phenergan is helping  -continue supportive care. If no improvement in next couple of days he may need imaging    SUBJECTIVE    Nausea / vomiting this am. Hurting in upper abdomen. Just "trying to get through it".    OBJECTIVE:     Vital signs in last 24 hours: Temp:  [97.4 F (36.3 C)-98.9 F (37.2 C)] 98.9 F (37.2 C) (02/11 1210) Pulse Rate:  [63-82] 79 (02/11 1210) Resp:  [16-18] 18 (02/11 1210) BP: (132-172)/(82-99) 150/92 (02/11 1210) SpO2:  [96 %-98 %] 97 % (02/11 1210)   General:   Alert in NAD Heart:  Regular rate and rhythm;  No lower extremity edema   Pulm: Normal respiratory effort   Abdomen:  Soft, nondistended, mild-mod LUQ / epigastric tenderness. A few bowel sounds.          Neurologic:  Alert and  oriented x4;  grossly normal neurologically. Psych:  Pleasant, cooperative.  Normal mood and affect.   Intake/Output from previous day: 02/10 0701 - 02/11 0700 In: 2272.4 [P.O.:360; I.V.:912.4; IV Piggyback:1000] Out: 300 [Urine:300] Intake/Output this shift: No intake/output data recorded.  Lab Results: Recent Labs    05/27/19 0726  WBC 8.3  HGB 14.4  HCT 43.3  PLT 215   BMET Recent Labs    05/27/19 0726  NA 139  K 3.8  CL 103  CO2 23  GLUCOSE 106*  BUN 13  CREATININE 1.24  CALCIUM 9.5   LFT Recent Labs    05/27/19 0726  PROT 7.1  ALBUMIN 3.9  AST 33  ALT 39  ALKPHOS 51  BILITOT 0.9   PT/INR No results for input(s): LABPROT, INR in the last 72 hours. Hepatitis Panel No results for  input(s): HEPBSAG, HCVAB, HEPAIGM, HEPBIGM in the last 72 hours.  US Abdomen Complete  Result Date: 05/27/2019 CLINICAL DATA:  Acute pancreatitis. EXAM: ABDOMEN ULTRASOUND COMPLETE COMPARISON:  Abdominal CT 02/04/2019, MRCP 04/09/2018 FINDINGS: Gallbladder: Surgically absent. Common bile duct: Diameter: 9 mm, normal for postcholecystectomy state. Liver: No focal lesion identified. Within normal limits in parenchymal echogenicity. Portal vein is patent on color Doppler imaging with normal direction of blood flow towards the liver. IVC: No abnormality visualized. Pancreas: Not well visualized sonographically. Suggestion of parenchymal atrophy. There is mild dilatation of the pancreatic duct measuring 4 mm. No obvious peripancreatic fluid collection. Spleen: Size and appearance within normal limits. Right Kidney: Not visualized. Prior imaging demonstrated markedly atrophic kidney, this is not well apparent sonographically. Left Kidney: Length: 14.4 cm. 5 mm nonobstructing calculus in the lower kidney. Echogenicity within normal limits. No mass or hydronephrosis visualized. Abdominal aorta: No aneurysm visualized. Other findings: No free fluid visualized. IMPRESSION: 1. Pancreas is not well-defined sonographically. Mild dilatation of the pancreatic duct of 4 mm. No sonographic evidence of focal fluid collection or free fluid in the abdomen. 2. Post cholecystectomy. Mild biliary prominence, normal postcholecystectomy. 3. Right kidney not visualized, markedly atrophic on prior imaging. Mild compensatory left renal hypertrophy. Nonobstructing stone in the lower left kidney. Electronically Signed   By: Threasa Beards  [place] M.D.   On: 05/27/2019 14:09     Principal Problem:   Acute on chronic pancreatitis [facility]) Active Problems:   Essential hypertension   Esophageal reflux   Nausea and vomiting   Congenital single kidney   High cholesterol   Pancreatic divisum   Acute pancreatitis   Pancreatic  insufficiency     LOS: 0 days   Tye Savoy ,NP 05/28/2019, 12:37 PM

## 2019-05-29 ENCOUNTER — Encounter (HOSPITAL_COMMUNITY): Payer: Self-pay

## 2019-05-29 ENCOUNTER — Inpatient Hospital Stay (HOSPITAL_COMMUNITY): Payer: Managed Care, Other (non HMO)

## 2019-05-29 LAB — CBC
HCT: 36.5 % — ABNORMAL LOW (ref 39.0–52.0)
Hemoglobin: 12.2 g/dL — ABNORMAL LOW (ref 13.0–17.0)
MCH: 32.4 pg (ref 26.0–34.0)
MCHC: 33.4 g/dL (ref 30.0–36.0)
MCV: 97.1 fL (ref 80.0–100.0)
Platelets: 180 10*3/uL (ref 150–400)
RBC: 3.76 MIL/uL — ABNORMAL LOW (ref 4.22–5.81)
RDW: 12.9 % (ref 11.5–15.5)
WBC: 7.5 10*3/uL (ref 4.0–10.5)
nRBC: 0 % (ref 0.0–0.2)

## 2019-05-29 LAB — LIPID PANEL
Cholesterol: 159 mg/dL (ref 0–200)
HDL: 56 mg/dL (ref >40)
LDL Cholesterol: 90 mg/dL (ref 0–99)
Total CHOL/HDL Ratio: 2.8 RATIO
Triglycerides: 64 mg/dL (ref <150)
VLDL: 13 mg/dL (ref 0–40)

## 2019-05-29 LAB — LIPASE, BLOOD: Lipase: 166 U/L — ABNORMAL HIGH (ref 11–51)

## 2019-05-29 LAB — BASIC METABOLIC PANEL
Anion gap: 11 (ref 5–15)
BUN: 7 mg/dL (ref 6–20)
CO2: 26 mmol/L (ref 22–32)
Calcium: 8.5 mg/dL — ABNORMAL LOW (ref 8.9–10.3)
Chloride: 100 mmol/L (ref 98–111)
Creatinine, Ser: 1.06 mg/dL (ref 0.61–1.24)
GFR calc Af Amer: >60 mL/min (ref >60)
GFR calc non Af Amer: >60 mL/min (ref >60)
Glucose, Bld: 92 mg/dL (ref 70–99)
Potassium: 3.7 mmol/L (ref 3.5–5.1)
Sodium: 137 mmol/L (ref 135–145)

## 2019-05-29 MED ORDER — METOCLOPRAMIDE HCL 5 MG/ML IJ SOLN
5.0000 mg | Freq: Three times a day (TID) | INTRAMUSCULAR | Status: DC
  Administered 2019-05-29 – 2019-05-31 (×6): 5 mg via INTRAVENOUS
  Filled 2019-05-29 (×6): qty 2

## 2019-05-29 NOTE — Progress Notes (Signed)
PROGRESS NOTE    Kyle Berry  [id number] DOB: [birth year] DOA: 05/27/2019 PCP: [name] (Inactive)     Brief Narrative:  Patient is a 49 year old male with a medical history significant for recurrent pancreatitis, hypertension, reflux, congenital single kidney, and elevated cholesterol who presented on 2/10 for worsening abdominal pain and vomiting.  Symptoms started 4-5 days prior to admission and reminded him of prior pancreatitis episodes.  No alcohol use for 2 years prior, did have some loose bowel movements over 2 days.  He was not have any fevers, chest pain, or shortness of breath upon evaluation.  Mild elevation of lipase in the ER.  Ultrasound showed poor visualization of the pancreas with mild dilatation of the pancreatic duct; also showed evidence of surgical removal of gallbladder.   New events last 24 hours / Subjective: Patient reports about 30-40% improvement in pain from yesterday.  Still has a poor appetite, nausea present but better than yesterday.  GI saw the patient and decided not to get a CT as he is making improvements.  Assessment & Plan:   Principal Problem:   Acute on chronic pancreatitis (HCC)  N.p.o., advance as able  IVF 150 mL/hour lactated Ringer's  Appreciate gastroenterology  Dilaudid 1 mg every 2 hours as needed for severe pain  Percocet 5-325 mg every 6 hours and as needed for moderate pain  Tylenol 650 mg every 6 hours as needed for mild pain  Compazine 10 mg every 4 hours as needed for nausea  Reglan 5 mg every 8 hours as needed for nausea  Ck lipids w hx of elevated cholesterol  Active Problems:   Essential hypertension  Hold home med, hydralazine 5 mg every 4 hours as needed for elevated blood pressure    Congenital single kidney  Monitor BMP   DVT prophylaxis: SCDs Code Status: Full Family Communication: Self Coming From: Home Disposition Plan: Home Barriers to Discharge: Clinical improvement; need to advance diet  without pain and nausea  Consultants:   Gastroenterology  Objective: Vitals:   05/29/19 0021 05/29/19 0557 05/29/19 0855 05/29/19 0945  BP: 139/89 135/88    Pulse: 81 78    Resp: 18 18    Temp: 98.9 F (37.2 C) 99 F (37.2 C)    TempSrc: Oral Oral    SpO2: 93% 94%    Weight:   77.6 kg   Height:    6' 1.5" (1.867 m)    Intake/Output Summary (Last 24 hours) at 05/29/2019 1033 Last data filed at 05/29/2019 0330 Gross per 24 hour  Intake 1093.17 ml  Output -  Net 1093.17 ml   Filed Weights   05/29/19 0855  Weight: 77.6 kg    Examination:  General exam: Appears calm and comfortable  Respiratory system: Clear to auscultation. Respiratory effort normal. No respiratory distress. No conversational dyspnea.  Cardiovascular system: S1 & S2 heard, RRR. No murmurs. No pedal edema. Gastrointestinal system: Abdomen is nondistended, soft and moderate tenderness to palpation over the epigastric region. Normal bowel sounds heard. Central nervous system: Alert and oriented. No focal neurological deficits. Speech clear.  Extremities: Symmetric in appearance  Skin: No rashes, lesions or ulcers on exposed skin  Psychiatry: Judgement and insight appear normal. Mood & affect appropriate.   Data Reviewed: I have personally reviewed following labs and imaging studies  CBC: Recent Labs  Lab 05/27/19 0726 05/28/19 1836 05/29/19 0553  WBC 8.3 8.5 7.5  HGB 14.4 13.0 12.2*  HCT 43.3 38.9* 36.5*  MCV 95.8 97.3  97.1  PLT 215 188 [id number]   Basic Metabolic Panel: Recent Labs  Lab 05/27/19 0726 05/28/19 1836 05/29/19 0553  NA 139 136 137  K 3.8 4.0 3.7  CL 103 102 100  CO2 23 27 26   GLUCOSE 106* 113* 92  BUN 13 6 7   CREATININE 1.24 1.07 1.06  CALCIUM 9.5 8.5* 8.5*   GFR: Estimated Creatinine Clearance: 92.5 mL/min (by C-G formula based on SCr of 1.06 mg/dL). Liver Function Tests: Recent Labs  Lab 05/27/19 0726 05/28/19 1836  AST 33 42*  ALT 39 43  ALKPHOS 51 63  BILITOT 0.9  1.4*  PROT 7.1 6.1*  ALBUMIN 3.9 3.2*   Recent Labs  Lab 05/27/19 0726 05/28/19 0137 05/29/19 0553  LIPASE 76* 345* 166*  AMYLASE 66  --   --    Recent Results (from the past 240 hour(s))  SARS CORONAVIRUS 2 (TAT 6-24 HRS) Nasopharyngeal Nasopharyngeal Swab     Status: None   Collection Time: 05/27/19  1:31 PM   Specimen: Nasopharyngeal Swab  Result Value Ref Range Status   SARS Coronavirus 2 NEGATIVE NEGATIVE Final    Comment: (NOTE) SARS-CoV-2 target nucleic acids are NOT DETECTED. The SARS-CoV-2 RNA is generally detectable in upper and lower respiratory specimens during the acute phase of infection. Negative results do not preclude SARS-CoV-2 infection, do not rule out co-infections with other pathogens, and should not be used as the sole basis for treatment or other patient management decisions. Negative results must be combined with clinical observations, patient history, and epidemiological information. The expected result is Negative. Fact Sheet for Patients: SugarRoll.be Fact Sheet for Healthcare Providers: https://www.woods-mathews.com/ This test is not yet approved or cleared by the [place] FDA and  has been authorized for detection and/or diagnosis of SARS-CoV-2 by FDA under an Emergency Use Authorization (EUA). This EUA will remain  in effect (meaning this test can be used) for the duration of the COVID-19 declaration under Section 56 4(b)(1) of the Act, 21 U.S.C. section 360bbb-3(b)(1), unless the authorization is terminated or revoked sooner. Performed at [facility], [place] [address] [place], [place] [address]       Radiology Studies: US Abdomen Complete  Result Date: 05/27/2019 CLINICAL DATA:  Acute pancreatitis. EXAM: ABDOMEN ULTRASOUND COMPLETE COMPARISON:  Abdominal CT 02/04/2019, MRCP 04/09/2018 FINDINGS: Gallbladder: Surgically absent. Common bile duct: Diameter: 9 mm, normal for  postcholecystectomy state. Liver: No focal lesion identified. Within normal limits in parenchymal echogenicity. Portal vein is patent on color Doppler imaging with normal direction of blood flow towards the liver. IVC: No abnormality visualized. Pancreas: Not well visualized sonographically. Suggestion of parenchymal atrophy. There is mild dilatation of the pancreatic duct measuring 4 mm. No obvious peripancreatic fluid collection. Spleen: Size and appearance within normal limits. Right Kidney: Not visualized. Prior imaging demonstrated markedly atrophic kidney, this is not well apparent sonographically. Left Kidney: Length: 14.4 cm. 5 mm nonobstructing calculus in the lower kidney. Echogenicity within normal limits. No mass or hydronephrosis visualized. Abdominal aorta: No aneurysm visualized. Other findings: No free fluid visualized. IMPRESSION: 1. Pancreas is not well-defined sonographically. Mild dilatation of the pancreatic duct of 4 mm. No sonographic evidence of focal fluid collection or free fluid in the abdomen. 2. Post cholecystectomy. Mild biliary prominence, normal postcholecystectomy. 3. Right kidney not visualized, markedly atrophic on prior imaging. Mild compensatory left renal hypertrophy. Nonobstructing stone in the lower left kidney. Electronically Signed   By: Keith Rake M.D.   On: 05/27/2019 14:09  DG Abd 2 Views  Result Date: 05/29/2019 CLINICAL DATA:  Pancreatitis. Generalized abdominal pain. EXAM: ABDOMEN - 2 VIEW COMPARISON:  CT of the abdomen and pelvis 10//20 FINDINGS: Fluid levels are present within nondilated loops of large and small bowel. Gas can be seen into the sigmoid colon rectum. No free air is present. Surgical clips are present gallbladder fossa. IMPRESSION: Fluid levels within nondilated loops of large and small bowel compatible with ileus. Electronically Signed   By: San Morelle M.D.   On: 05/29/2019 08:41     Scheduled Meds: . metoCLOPramide (REGLAN)  injection  5 mg Intravenous Q8H  . sodium chloride flush  3 mL Intravenous Once   Continuous Infusions: . lactated ringers 150 mL/hr at 05/29/19 0256     LOS: 1 day    Time spent: 25 minutes   Shelda Pal, DO [facility] 05/29/2019, 10:33 AM   Available via Epic secure chat 7am-7pm After these hours, please refer to coverage provider listed on [email or web address]

## 2019-05-29 NOTE — Progress Notes (Addendum)
Progress Note    ASSESSMENT AND PLAN:   1. Acute on chronic pancreatitis. Etiology of pancreatitis elusive, maybe secondary to pancreatic divisum. Lipase down 345 >> 166. Yesterday he was having worsening pain, nausea / vomiting. He was made NPO, IVF and pain meds increased. He does feel better today with less pain. Still has nausea but no vomiting.  --ESR normal but CRP ~ 6.  --not hemoconcentrated based on HCT (even prior to yesterday's bolus). Hemodynamically stable. Renal function normal. Continue IVF at current rate since NPO.  --Holding off on CT scan unless he doesn't continue to improve.  --Has probable ileus based on plain films. Not unexpected given illness, narcotics, decreased mobiltiy. He is ambulating. May or may not help but will try low dose of IV Reglan. Continue Compazine as needed --Eventual evaluation / treatment of pancreatic divisum  2. Congential single kidney   SUBJECTIVE   Less pain today since pain meds increased yesterday. Still frequently nauseated but no vomiting   OBJECTIVE:     Vital signs in last 24 hours: Temp:  [98.4 F (36.9 C)-99 F (37.2 C)] 99 F (37.2 C) (02/12 0557) Pulse Rate:  [78-81] 78 (02/12 0557) Resp:  [18] 18 (02/12 0557) BP: (135-150)/(88-92) 135/88 (02/12 0557) SpO2:  [93 %-98 %] 94 % (02/12 0557) Weight:  [77.6 kg] 77.6 kg (02/12 0855)   General:   Alert in NAD EENT:  Normal hearing, non icteric sclera   Heart:  Regular rate and rhythm;  No lower extremity edema   Pulm: Normal respiratory effort   Abdomen:  Soft, nondistended, mild LUQ / epigastric tenderness.    Normal bowel sounds.          Neurologic:  Alert and  oriented x4;  grossly normal neurologically. Psych:  Pleasant, cooperative.  Normal mood and affect.   Intake/Output from previous day: 02/11 0701 - 02/12 0700 In: 1093.2 [I.V.:1093.2] Out: -  Intake/Output this shift: No intake/output data recorded.  Lab Results: Recent Labs    05/27/19 0726  05/28/19 1836 05/29/19 0553  WBC 8.3 8.5 7.5  HGB 14.4 13.0 12.2*  HCT 43.3 38.9* 36.5*  PLT 215 188 180   BMET Recent Labs    05/27/19 0726 05/28/19 1836 05/29/19 0553  NA 139 136 137  K 3.8 4.0 3.7  CL 103 102 100  CO2 23 27 26   GLUCOSE 106* 113* 92  BUN 13 6 7   CREATININE 1.24 1.07 1.06  CALCIUM 9.5 8.5* 8.5*   LFT Recent Labs    05/28/19 1836  PROT 6.1*  ALBUMIN 3.2*  AST 42*  ALT 43  ALKPHOS 63  BILITOT 1.4*   PT/INR No results for input(s): LABPROT, INR in the last 72 hours. Hepatitis Panel No results for input(s): HEPBSAG, HCVAB, HEPAIGM, HEPBIGM in the last 72 hours.  US Abdomen Complete  Result Date: 05/27/2019 CLINICAL DATA:  Acute pancreatitis. EXAM: ABDOMEN ULTRASOUND COMPLETE COMPARISON:  Abdominal CT 02/04/2019, MRCP 04/09/2018 FINDINGS: Gallbladder: Surgically absent. Common bile duct: Diameter: 9 mm, normal for postcholecystectomy state. Liver: No focal lesion identified. Within normal limits in parenchymal echogenicity. Portal vein is patent on color Doppler imaging with normal direction of blood flow towards the liver. IVC: No abnormality visualized. Pancreas: Not well visualized sonographically. Suggestion of parenchymal atrophy. There is mild dilatation of the pancreatic duct measuring 4 mm. No obvious peripancreatic fluid collection. Spleen: Size and appearance within normal limits. Right Kidney: Not visualized. Prior imaging demonstrated markedly atrophic kidney, this is not  well apparent sonographically. Left Kidney: Length: 14.4 cm. 5 mm nonobstructing calculus in the lower kidney. Echogenicity within normal limits. No mass or hydronephrosis visualized. Abdominal aorta: No aneurysm visualized. Other findings: No free fluid visualized. IMPRESSION: 1. Pancreas is not well-defined sonographically. Mild dilatation of the pancreatic duct of 4 mm. No sonographic evidence of focal fluid collection or free fluid in the abdomen. 2. Post cholecystectomy. Mild  biliary prominence, normal postcholecystectomy. 3. Right kidney not visualized, markedly atrophic on prior imaging. Mild compensatory left renal hypertrophy. Nonobstructing stone in the lower left kidney. Electronically Signed   By: Keith Rake M.D.   On: 05/27/2019 14:09   DG Abd 2 Views  Result Date: 05/29/2019 CLINICAL DATA:  Pancreatitis. Generalized abdominal pain. EXAM: ABDOMEN - 2 VIEW COMPARISON:  CT of the abdomen and pelvis 10//20 FINDINGS: Fluid levels are present within nondilated loops of large and small bowel. Gas can be seen into the sigmoid colon rectum. No free air is present. Surgical clips are present gallbladder fossa. IMPRESSION: Fluid levels within nondilated loops of large and small bowel compatible with ileus. Electronically Signed   By: San Morelle M.D.   On: 05/29/2019 08:41    Principal Problem:   Acute on chronic pancreatitis [facility]) Active Problems:   Essential hypertension   Esophageal reflux   Nausea and vomiting   Congenital single kidney   High cholesterol   Pancreatic divisum   Acute pancreatitis   Pancreatic insufficiency     LOS: 1 day   Tye Savoy ,NP 05/29/2019, 10:01 AM   I have discussed the case with the PA, and that is the plan I formulated. I personally interviewed and examined the patient.  CC: Acute pancreatitis, idiopathic  He had a setback yesterday with more pain and nausea, and an elevation of his lipase.  Seems better today, pain meds were increased.  He is having clear liquids and so far tolerating it reasonably well.  It seems he needs more time on limited diet, supportive care with IV fluids, antiemetics and pain medicines.  I expect he will be here through the weekend.  I also let him know that discussions had been opened up amongst our various providers regarding the further work-up of his pancreas divisum and its potential role in his recurrent pancreatitis.  That is, of course, something to be addressed after  resolution of this acute episode.  I will see him tomorrow if called, otherwise the following day.    Nelida Meuse III Office: [phone]

## 2019-05-30 LAB — CBC
HCT: 38.9 % — ABNORMAL LOW (ref 39.0–52.0)
Hemoglobin: 12.6 g/dL — ABNORMAL LOW (ref 13.0–17.0)
MCH: 32.1 pg (ref 26.0–34.0)
MCHC: 32.4 g/dL (ref 30.0–36.0)
MCV: 99 fL (ref 80.0–100.0)
Platelets: 172 10*3/uL (ref 150–400)
RBC: 3.93 MIL/uL — ABNORMAL LOW (ref 4.22–5.81)
RDW: 13 % (ref 11.5–15.5)
WBC: 6.3 10*3/uL (ref 4.0–10.5)
nRBC: 0 % (ref 0.0–0.2)

## 2019-05-30 LAB — C-REACTIVE PROTEIN: CRP: 11.4 mg/dL — ABNORMAL HIGH (ref <1.0)

## 2019-05-30 LAB — BASIC METABOLIC PANEL
Anion gap: 8 (ref 5–15)
BUN: 5 mg/dL — ABNORMAL LOW (ref 6–20)
CO2: 29 mmol/L (ref 22–32)
Calcium: 9 mg/dL (ref 8.9–10.3)
Chloride: 100 mmol/L (ref 98–111)
Creatinine, Ser: 1.15 mg/dL (ref 0.61–1.24)
GFR calc Af Amer: >60 mL/min (ref >60)
GFR calc non Af Amer: >60 mL/min (ref >60)
Glucose, Bld: 105 mg/dL — ABNORMAL HIGH (ref 70–99)
Potassium: 3.7 mmol/L (ref 3.5–5.1)
Sodium: 137 mmol/L (ref 135–145)

## 2019-05-30 LAB — SEDIMENTATION RATE: Sed Rate: 45 mm/hr — ABNORMAL HIGH (ref 0–16)

## 2019-05-30 MED ORDER — PANCRELIPASE (LIP-PROT-AMYL) 36000-114000 UNITS PO CPEP
36000.0000 [IU] | ORAL_CAPSULE | Freq: Three times a day (TID) | ORAL | Status: DC
  Administered 2019-05-30 – 2019-06-03 (×12): 36000 [IU] via ORAL
  Filled 2019-05-30 (×14): qty 1

## 2019-05-30 MED ORDER — PANCRELIPASE (LIP-PROT-AMYL) 12000-38000 UNITS PO CPEP
24000.0000 [IU] | ORAL_CAPSULE | Freq: Three times a day (TID) | ORAL | Status: DC
  Administered 2019-05-30 (×2): 24000 [IU] via ORAL
  Filled 2019-05-30 (×3): qty 2

## 2019-05-30 MED ORDER — LACTATED RINGERS IV BOLUS
500.0000 mL | Freq: Once | INTRAVENOUS | Status: AC
  Administered 2019-05-30: 500 mL via INTRAVENOUS

## 2019-05-30 MED ORDER — LISINOPRIL 10 MG PO TABS
10.0000 mg | ORAL_TABLET | Freq: Every day | ORAL | Status: DC
  Administered 2019-05-30 – 2019-05-31 (×2): 10 mg via ORAL
  Filled 2019-05-30 (×2): qty 1

## 2019-05-30 NOTE — Progress Notes (Signed)
PROGRESS NOTE    Kyle Berry DOCTOR  [id number] DOB: Jun 05, 1970 DOA: 05/27/2019  PCP: [name] (Inactive)   Brief Narrative:  Patient is a 49 year old male with a medical history significant for recurrent pancreatitis, hypertension, reflux, congenital single kidney, and elevated cholesterol who presented on 2/10 for worsening abdominal pain and vomiting.  Symptoms started 4-5 days prior to admission and reminded him of prior pancreatitis episodes.  No alcohol use for 2 years prior, did have some loose bowel movements over 2 days.  He was not have any fevers, chest pain, or shortness of breath upon evaluation.  Mild elevation of lipase in the ER.  Ultrasound showed poor visualization of the pancreas with mild dilatation of the pancreatic duct; also showed evidence of surgical removal of gallbladder.    Assessment & Plan:   Principal Problem:   Acute on chronic pancreatitis [facility]) Active Problems:   Essential hypertension   Esophageal reflux   Nausea and vomiting   Congenital single kidney   High cholesterol   Pancreatic divisum   Acute pancreatitis   Pancreatic insufficiency   Clinical problems list 1.  Acute on chronic pancreatitis/ileus 2.  Essential hypertension 3.  Unilateral kidney, congenital  1.  Acute on chronic pancreatitis/ileus.  Patient with a history of chronic pancreatitis, presenting with abdominal pain nausea vomiting.  Serum lipase was elevated at 345 and consideration on admission was for acute on chronic pancreatitis.  He was initiated on n.p.o., fluid resuscitation and pain management.  His condition has improved and patient is currently tolerating liquid diet which is advanced to soft and then regular diet. He started on Creon-pancreatic enzyme replacement. Continue with metoclopramide Will consider discharge home tomorrow with improvement in oral tolerance.  2.  Essential hypertension Patient takes telmisartan 40 mg daily at home Will  initiate lisinopril 10 mg daily and optimize as blood pressure permits  3.  Unilateral kidney, congenital.  Renal function appears within normal range. Continue to monitor renal function Avoid nephrotoxic agents.     DVT prophylaxis: SCD Code Status: Full code  Family Communication: None at bedside  Disposition Plan: Patient is from home and will be discharged home. No barrier to discharge.  Possible discharge home on 05/31/2019   Consultants:   GI  Procedures: None   Antimicrobials: None   Subjective: Patient was seen and evaluated at bedside.  Is alert and oriented x4.  Not in acute distress.  Patient is tolerating clear liquids without any abdominal pain, nausea or vomiting.  Diet advanced to soft diet and if tolerated will advance to regular diet at dinner.  He will likely be discharged home tomorrow.  Objective: Vitals:   05/30/19 0025 05/30/19 0603 05/30/19 0810 05/30/19 1222  BP: 133/90 136/86 (!) 147/92 (!) 144/102  Pulse: 67 71 70 88  Resp: 18 17 16 16   Temp: 98.7 F (37.1 C) 98.7 F (37.1 C)    TempSrc: Oral Oral    SpO2: 94% 94%  95%  Weight:      Height:        Intake/Output Summary (Last 24 hours) at 05/30/2019 1548 Last data filed at 05/30/2019 1044 Gross per 24 hour  Intake 1160.22 ml  Output -  Net 1160.22 ml   Filed Weights   05/29/19 0855  Weight: 77.6 kg    Examination:  General exam: Appears calm and comfortable.  Laying quietly in bed and not in acute distress. Respiratory system: Clear to auscultation. Respiratory effort normal. Cardiovascular system: S1 & S2 heard,  RRR. No JVD, murmurs, rubs, gallops or clicks. No pedal edema. Gastrointestinal system: Abdomen is nondistended, soft and nontender.  No organomegaly or masses felt. Normal bowel sounds heard. Central nervous system: Alert and oriented. No focal neurological deficits. Extremities: Symmetric 5 x 5 power. Skin: No rashes, lesions or ulcers Psychiatry: Judgement and insight  appear normal. Mood & affect appropriate.     Data Reviewed: I have personally reviewed following labs and imaging studies  CBC: Recent Labs  Lab 05/27/19 0726 05/28/19 1836 05/29/19 0553 05/30/19 0629  WBC 8.3 8.5 7.5 6.3  HGB 14.4 13.0 12.2* 12.6*  HCT 43.3 38.9* 36.5* 38.9*  MCV 95.8 97.3 97.1 99.0  PLT 215 188 180 [id number]   Basic Metabolic Panel: Recent Labs  Lab 05/27/19 0726 05/28/19 1836 05/29/19 0553 05/30/19 0629  NA 139 136 137 137  K 3.8 4.0 3.7 3.7  CL 103 102 100 100  CO2 23 27 26 29   GLUCOSE 106* 113* 92 105*  BUN 13 6 7  5*  CREATININE 1.24 1.07 1.06 1.15  CALCIUM 9.5 8.5* 8.5* 9.0   GFR: Estimated Creatinine Clearance: 85.3 mL/min (by C-G formula based on SCr of 1.15 mg/dL). Liver Function Tests: Recent Labs  Lab 05/27/19 0726 05/28/19 1836  AST 33 42*  ALT 39 43  ALKPHOS 51 63  BILITOT 0.9 1.4*  PROT 7.1 6.1*  ALBUMIN 3.9 3.2*   Recent Labs  Lab 05/27/19 0726 05/28/19 0137 05/29/19 0553  LIPASE 76* 345* 166*  AMYLASE 66  --   --    No results for input(s): AMMONIA in the last 168 hours. Coagulation Profile: No results for input(s): INR, PROTIME in the last 168 hours. Cardiac Enzymes: No results for input(s): CKTOTAL, CKMB, CKMBINDEX, TROPONINI in the last 168 hours. BNP (last 3 results) No results for input(s): PROBNP in the last 8760 hours. HbA1C: No results for input(s): HGBA1C in the last 72 hours. CBG: No results for input(s): GLUCAP in the last 168 hours. Lipid Profile: Recent Labs    05/29/19 0553  CHOL 159  HDL 56  LDLCALC 90  TRIG 64  CHOLHDL 2.8   Thyroid Function Tests: No results for input(s): TSH, T4TOTAL, FREET4, T3FREE, THYROIDAB in the last 72 hours. Anemia Panel: No results for input(s): VITAMINB12, FOLATE, FERRITIN, TIBC, IRON, RETICCTPCT in the last 72 hours. Sepsis Labs: No results for input(s): PROCALCITON, LATICACIDVEN in the last 168 hours.  Recent Results (from the past 240 hour(s))  SARS  CORONAVIRUS 2 (TAT 6-24 HRS) Nasopharyngeal Nasopharyngeal Swab     Status: None   Collection Time: 05/27/19  1:31 PM   Specimen: Nasopharyngeal Swab  Result Value Ref Range Status   SARS Coronavirus 2 NEGATIVE NEGATIVE Final    Comment: (NOTE) SARS-CoV-2 target nucleic acids are NOT DETECTED. The SARS-CoV-2 RNA is generally detectable in upper and lower respiratory specimens during the acute phase of infection. Negative results do not preclude SARS-CoV-2 infection, do not rule out co-infections with other pathogens, and should not be used as the sole basis for treatment or other patient management decisions. Negative results must be combined with clinical observations, patient history, and epidemiological information. The expected result is Negative. Fact Sheet for Patients: SugarRoll.be Fact Sheet for Healthcare Providers: https://www.woods-mathews.com/ This test is not yet approved or cleared by the [place] FDA and  has been authorized for detection and/or diagnosis of SARS-CoV-2 by FDA under an Emergency Use Authorization (EUA). This EUA will remain  in effect (meaning this test can be used)  for the duration of the COVID-19 declaration under Section 56 4(b)(1) of the Act, 21 U.S.C. section 360bbb-3(b)(1), unless the authorization is terminated or revoked sooner. Performed at [facility], [place] [address], [place], [place] [address]          Radiology Studies: DG Abd 2 Views  Result Date: 05/29/2019 CLINICAL DATA:  Pancreatitis. Generalized abdominal pain. EXAM: ABDOMEN - 2 VIEW COMPARISON:  CT of the abdomen and pelvis 10//20 FINDINGS: Fluid levels are present within nondilated loops of large and small bowel. Gas can be seen into the sigmoid colon rectum. No free air is present. Surgical clips are present gallbladder fossa. IMPRESSION: Fluid levels within nondilated loops of large and small bowel compatible with ileus.  Electronically Signed   By: San Morelle M.D.   On: 05/29/2019 08:41        Scheduled Meds: . lipase/protease/amylase  24,000 Units Oral TID AC  . metoCLOPramide (REGLAN) injection  5 mg Intravenous Q8H  . sodium chloride flush  3 mL Intravenous Once   Continuous Infusions:   LOS: 2 days    Time spent: 35 minutes    Elie Confer, MD [facility] Pager [phone]   If 7PM-7AM, please contact night-coverage [email or web address] Password [facility] 05/30/2019, 3:48 PM

## 2019-05-30 NOTE — Plan of Care (Signed)
  Problem: Education: Goal: Knowledge of Pancreatitis treatment and prevention will improve Outcome: Progressing   Problem: Elimination: Goal: Will not experience complications related to bowel motility Outcome: Progressing   Problem: Pain Managment: Goal: General experience of comfort will improve Outcome: Progressing

## 2019-05-31 ENCOUNTER — Inpatient Hospital Stay (HOSPITAL_COMMUNITY): Payer: Managed Care, Other (non HMO)

## 2019-05-31 DIAGNOSIS — R112 Nausea with vomiting, unspecified: Secondary | ICD-10-CM

## 2019-05-31 LAB — COMPREHENSIVE METABOLIC PANEL
ALT: 37 U/L (ref 0–44)
AST: 40 U/L (ref 15–41)
Albumin: 3 g/dL — ABNORMAL LOW (ref 3.5–5.0)
Alkaline Phosphatase: 68 U/L (ref 38–126)
Anion gap: 11 (ref 5–15)
BUN: 7 mg/dL (ref 6–20)
CO2: 25 mmol/L (ref 22–32)
Calcium: 8.8 mg/dL — ABNORMAL LOW (ref 8.9–10.3)
Chloride: 98 mmol/L (ref 98–111)
Creatinine, Ser: 1.09 mg/dL (ref 0.61–1.24)
GFR calc Af Amer: >60 mL/min (ref >60)
GFR calc non Af Amer: >60 mL/min (ref >60)
Glucose, Bld: 114 mg/dL — ABNORMAL HIGH (ref 70–99)
Potassium: 3.7 mmol/L (ref 3.5–5.1)
Sodium: 134 mmol/L — ABNORMAL LOW (ref 135–145)
Total Bilirubin: 0.8 mg/dL (ref 0.3–1.2)
Total Protein: 6.3 g/dL — ABNORMAL LOW (ref 6.5–8.1)

## 2019-05-31 LAB — CBC
HCT: 35.9 % — ABNORMAL LOW (ref 39.0–52.0)
Hemoglobin: 12.1 g/dL — ABNORMAL LOW (ref 13.0–17.0)
MCH: 32.3 pg (ref 26.0–34.0)
MCHC: 33.7 g/dL (ref 30.0–36.0)
MCV: 95.7 fL (ref 80.0–100.0)
Platelets: 180 10*3/uL (ref 150–400)
RBC: 3.75 MIL/uL — ABNORMAL LOW (ref 4.22–5.81)
RDW: 12.8 % (ref 11.5–15.5)
WBC: 7 10*3/uL (ref 4.0–10.5)
nRBC: 0 % (ref 0.0–0.2)

## 2019-05-31 LAB — C-REACTIVE PROTEIN: CRP: 6.4 mg/dL — ABNORMAL HIGH (ref <1.0)

## 2019-05-31 LAB — PHOSPHORUS: Phosphorus: 2.9 mg/dL (ref 2.5–4.6)

## 2019-05-31 LAB — MAGNESIUM: Magnesium: 1.9 mg/dL (ref 1.7–2.4)

## 2019-05-31 MED ORDER — METOCLOPRAMIDE HCL 5 MG/ML IJ SOLN: 5.0000 mg | Freq: Three times a day (TID) | INTRAMUSCULAR | Status: DC

## 2019-05-31 MED ORDER — ONDANSETRON HCL 4 MG/2ML IJ SOLN: 4.0000 mg | Freq: Three times a day (TID) | INTRAMUSCULAR | Status: DC | PRN

## 2019-05-31 MED ORDER — PROCHLORPERAZINE EDISYLATE 10 MG/2ML IJ SOLN
10.0000 mg | Freq: Four times a day (QID) | INTRAMUSCULAR | Status: DC | PRN
  Administered 2019-05-31: 10 mg via INTRAVENOUS
  Filled 2019-05-31: qty 2

## 2019-05-31 MED ORDER — ONDANSETRON HCL 4 MG/2ML IJ SOLN
4.0000 mg | Freq: Three times a day (TID) | INTRAMUSCULAR | Status: DC
  Administered 2019-05-31: 4 mg via INTRAVENOUS
  Filled 2019-05-31: qty 2

## 2019-05-31 MED ORDER — METOCLOPRAMIDE HCL 5 MG PO TABS
5.0000 mg | ORAL_TABLET | Freq: Three times a day (TID) | ORAL | Status: DC
  Administered 2019-05-31 – 2019-06-02 (×6): 5 mg via ORAL
  Filled 2019-05-31 (×6): qty 1

## 2019-05-31 MED ORDER — LISINOPRIL 40 MG PO TABS
40.0000 mg | ORAL_TABLET | Freq: Every day | ORAL | Status: DC
  Administered 2019-06-01 – 2019-06-03 (×3): 40 mg via ORAL
  Filled 2019-05-31 (×3): qty 1

## 2019-05-31 MED ORDER — HYDROMORPHONE HCL 1 MG/ML IJ SOLN
1.0000 mg | Freq: Three times a day (TID) | INTRAMUSCULAR | Status: DC
  Administered 2019-05-31 – 2019-06-01 (×2): 1 mg via INTRAVENOUS
  Filled 2019-05-31 (×2): qty 1

## 2019-05-31 NOTE — Progress Notes (Addendum)
Progress Note    ASSESSMENT AND PLAN:    Acute on chronic pancreatitis. Had a set back last evening. Had solids for lunch and dinner but a couple of hours after dinner developed abdominal distention, pain, nausea and vomiting. Had grits and toast this am for breakfast, feels a little nauseated but otherwise okay. Abdominal exam is unremarkable. Non-toxic appearing. Hct 40. Afebrile, normal WBC. Having bowel movements --continue Creon with meals and snacks --He is on regular diet. Will change to low fat. Hopefully we won't have to back diet down.  --continue IV Reglan. Now that he is taking PO will change Reglan to [facility].   --Other than Reglan he isn't taking anything for nausea.  He previously told me Zofran wasn't effective but I still think it is worth adding for now. Getting frequent pain meds which may be contributing to the nausea --Continue to ambulate   SUBJECTIVE    Abdominal pain, distention, vomiting last evening. So far doing okay after grits and toast this am, just some nausea.   Additional review of systems: Denies chest pain dyspnea or dysuria OBJECTIVE:     Vital signs in last 24 hours: Temp:  [98.5 F (36.9 C)-98.6 F (37 C)] 98.6 F (37 C) (02/14 0605) Pulse Rate:  [65-88] 67 (02/14 0806) Resp:  [15-16] 16 (02/14 0806) BP: (139-158)/(85-102) 148/93 (02/14 0806) SpO2:  [92 %-95 %] 92 % (02/14 0605) Last BM Date: 05/30/19 General:   Alert in NAD EENT:  Normal hearing, non icteric sclera   Heart:  Regular rate and rhythm;  No lower extremity edema   Pulm: Normal respiratory effort   Abdomen:  Soft, nondistended, nontender.  Normal bowel sounds.          Neurologic:  Alert and  oriented x4;  grossly normal neurologically. Psych:  Pleasant, cooperative.  Normal mood and affect.   Intake/Output from previous day: 02/13 0701 - 02/14 0700 In: 1280.2 [P.O.:780; IV Piggyback:500.2] Out: -  Intake/Output this shift: Total I/O In: 120 [P.O.:120] Out: -    Lab Results: Recent Labs    05/29/19 0553 05/30/19 0629 05/31/19 0226  WBC 7.5 6.3 7.0  HGB 12.2* 12.6* 12.1*  HCT 36.5* 38.9* 35.9*  PLT 180 172 180   BMET Recent Labs    05/29/19 0553 05/30/19 0629 05/31/19 0226  NA 137 137 134*  K 3.7 3.7 3.7  CL 100 100 98  CO2 26 29 25   GLUCOSE 92 105* 114*  BUN 7 5* 7  CREATININE 1.06 1.15 1.09  CALCIUM 8.5* 9.0 8.8*   LFT Recent Labs    05/31/19 0226  PROT 6.3*  ALBUMIN 3.0*  AST 40  ALT 37  ALKPHOS 68  BILITOT 0.8   PT/INR No results for input(s): LABPROT, INR in the last 72 hours. Hepatitis Panel No results for input(s): HEPBSAG, HCVAB, HEPAIGM, HEPBIGM in the last 72 hours.  DG Abd 1 View  Result Date: 05/31/2019 CLINICAL DATA:  History of pancreatitis EXAM: ABDOMEN - 1 VIEW COMPARISON:  05/29/2019 FINDINGS: Scattered large and small bowel gas is noted improved when compared with the prior exam. No free air is seen. No obstructive changes are noted. Changes consistent with prior cholecystectomy are seen. No bony abnormality is noted. IMPRESSION: Scattered bowel gas without dilatation. Electronically Signed   By: Inez Catalina M.D.   On: 05/31/2019 09:46    Principal Problem:   Acute on chronic pancreatitis [facility]) Active Problems:   Essential hypertension   Esophageal reflux  Nausea and vomiting   Congenital single kidney   High cholesterol   Pancreatic divisum   Acute pancreatitis   Pancreatic insufficiency     LOS: 3 days   Tye Savoy ,NP 05/31/2019, 9:52 AM    I have discussed the case with the PA, and that is the plan I formulated. I personally interviewed and examined the patient.  CC: Acute on chronic pancreatitis  Not clear why he keeps having flares of pain, though that may just be nature of this particular flare he is having.  It will not always be reflected an increase in pancreatic enzymes since he has chronic pancreatitis and therefore less pancreatic parenchyma to release enzymes  during inflammation.  We will continue current management, pain meds, antiemetics, diet as tolerated, and hope this settles down soon.  His exam is benign, and initial CT only had minor radiographic evidence of pancreatitis, so I do not think there is likely to be a complication such as large fluid collection at this point.  Not planning to reimage right now.  We will follow. Nelida Meuse III Office: [phone]

## 2019-05-31 NOTE — Progress Notes (Signed)
PROGRESS NOTE    Kyle Berry  [id number] DOB: [birth year] DOA: 05/27/2019  PCP: [name] (Inactive)   Brief Narrative:  Patient is a 49 year old male with a medical history significant for recurrent pancreatitis, hypertension, reflux, congenital single kidney, and elevated cholesterol who presented on 2/10 for worsening abdominal pain and vomiting.  Symptoms started 4-5 days prior to admission and reminded him of prior pancreatitis episodes.  No alcohol use for 2 years prior, did have some loose bowel movements over 2 days.  He was not have any fevers, chest pain, or shortness of breath upon evaluation.  Mild elevation of lipase in the ER.  Ultrasound showed poor visualization of the pancreas with mild dilatation of the pancreatic duct; also showed evidence of surgical removal of gallbladder. 05/31/2019 Patient reported with episodes of nausea, abdominal distention and vomiting overnight after evening full meal.  Plain abdominal x-ray done this morning showed scattered bowel gas without bowel distention. GI evaluated and recommended low-fat diet and continued metoclopramide    Assessment & Plan:   Principal Problem:   Acute on chronic pancreatitis [facility]) Active Problems:   Essential hypertension   Esophageal reflux   Nausea and vomiting   Congenital single kidney   High cholesterol   Pancreatic divisum   Acute pancreatitis   Pancreatic insufficiency   Clinical problems list 1.  Acute on chronic pancreatitis/ileus 2.  Essential hypertension 3.  Unilateral kidney, congenital  1.  Acute on chronic pancreatitis/ileus.  Patient with a history of chronic pancreatitis, presenting with abdominal pain nausea vomiting.  Serum lipase was elevated at 345 and consideration on admission was for acute on chronic pancreatitis.  He was initiated on n.p.o., fluid resuscitation and pain management.  His condition has improved and patient is currently tolerating liquid diet which  is advanced to soft and then regular diet. He started on Creon-pancreatic enzyme replacement. Continue with metoclopramide Due to episode of nausea, abdominal distention or vomiting overnight, plain abdominal x-ray was essentially no significant for any distention/ileus. We will continue with low-fat diet per GI recommendation and transition Reglan to p.o. Patient is getting promethazine and Zofran for N/V. It is prudent to use one and not both due to potential for QT prolongation. If patient continues to tolerate orally without any more nausea vomiting, will plan to discharge home tomorrow.  2.  Essential hypertension Patient takes telmisartan 40 mg daily at home Will initiate lisinopril 40 mg daily and optimize as blood pressure permits  3.  Unilateral kidney, congenital.  Renal function appears within normal range. Continue to monitor renal function Avoid nephrotoxic agents.     DVT prophylaxis: SCD Code Status: Full code  Family Communication: None at bedside  Disposition Plan: Patient is from home and will be discharged home. No barrier to discharge.  Possible discharge home on 05/31/2019   Consultants:   GI  Procedures: None   Antimicrobials: None   Subjective: Patient was seen and evaluated at bedside.  Is alert and oriented x4.  Not in acute distress.  Patient is tolerating clear liquids without any abdominal pain, nausea or vomiting.  Diet advanced to soft diet and if tolerated will advance to regular diet at dinner.  He will likely be discharged home tomorrow.  Objective: Vitals:   05/30/19 2337 05/31/19 0605 05/31/19 0806 05/31/19 1024  BP: (!) 158/97 (!) 141/85 (!) 148/93 (!) 143/91  Pulse: 68 65 67 67  Resp: 16 15 16 16   Temp: 98.5 F (36.9 C) 98.6 F (37 C)  TempSrc: Oral Oral    SpO2: 92% 92%    Weight:      Height:        Intake/Output Summary (Last 24 hours) at 05/31/2019 1137 Last data filed at 05/31/2019 0721 Gross per 24 hour  Intake 240 ml   Output --  Net 240 ml   Filed Weights   05/29/19 0855  Weight: 77.6 kg    Examination:  General exam: Appears calm and comfortable.  Laying quietly in bed and not in acute distress. Respiratory system: Clear to auscultation. Respiratory effort normal. Cardiovascular system: S1 & S2 heard, RRR. No JVD, murmurs, rubs, gallops or clicks. No pedal edema. Gastrointestinal system: Abdomen is nondistended, soft and nontender.  Bowel sounds are positive x4 Central nervous system: Alert and oriented. No focal neurological deficits. Extremities: Symmetric 5 x 5 power. Skin: No rashes, lesions or ulcers Psychiatry: Judgement and insight appear normal. Mood & affect appropriate.     Data Reviewed: I have personally reviewed following labs and imaging studies  CBC: Recent Labs  Lab 05/27/19 0726 05/28/19 1836 05/29/19 0553 05/30/19 0629 05/31/19 0226  WBC 8.3 8.5 7.5 6.3 7.0  HGB 14.4 13.0 12.2* 12.6* 12.1*  HCT 43.3 38.9* 36.5* 38.9* 35.9*  MCV 95.8 97.3 97.1 99.0 95.7  PLT 215 188 180 172 [id number]   Basic Metabolic Panel: Recent Labs  Lab 05/27/19 0726 05/28/19 1836 05/29/19 0553 05/30/19 0629 05/31/19 0226  NA 139 136 137 137 134*  K 3.8 4.0 3.7 3.7 3.7  CL 103 102 100 100 98  CO2 23 27 26 29 25   GLUCOSE 106* 113* 92 105* 114*  BUN 13 6 7  5* 7  CREATININE 1.24 1.07 1.06 1.15 1.09  CALCIUM 9.5 8.5* 8.5* 9.0 8.8*  MG  --   --   --   --  1.9  PHOS  --   --   --   --  2.9   GFR: Estimated Creatinine Clearance: 90 mL/min (by C-G formula based on SCr of 1.09 mg/dL). Liver Function Tests: Recent Labs  Lab 05/27/19 0726 05/28/19 1836 05/31/19 0226  AST 33 42* 40  ALT 39 43 37  ALKPHOS 51 63 68  BILITOT 0.9 1.4* 0.8  PROT 7.1 6.1* 6.3*  ALBUMIN 3.9 3.2* 3.0*   Recent Labs  Lab 05/27/19 0726 05/28/19 0137 05/29/19 0553  LIPASE 76* 345* 166*  AMYLASE 66  --   --    No results for input(s): AMMONIA in the last 168 hours. Coagulation Profile: No results for  input(s): INR, PROTIME in the last 168 hours. Cardiac Enzymes: No results for input(s): CKTOTAL, CKMB, CKMBINDEX, TROPONINI in the last 168 hours. BNP (last 3 results) No results for input(s): PROBNP in the last 8760 hours. HbA1C: No results for input(s): HGBA1C in the last 72 hours. CBG: No results for input(s): GLUCAP in the last 168 hours. Lipid Profile: Recent Labs    05/29/19 0553  CHOL 159  HDL 56  LDLCALC 90  TRIG 64  CHOLHDL 2.8   Thyroid Function Tests: No results for input(s): TSH, T4TOTAL, FREET4, T3FREE, THYROIDAB in the last 72 hours. Anemia Panel: No results for input(s): VITAMINB12, FOLATE, FERRITIN, TIBC, IRON, RETICCTPCT in the last 72 hours. Sepsis Labs: No results for input(s): PROCALCITON, LATICACIDVEN in the last 168 hours.  Recent Results (from the past 240 hour(s))  SARS CORONAVIRUS 2 (TAT 6-24 HRS) Nasopharyngeal Nasopharyngeal Swab     Status: None   Collection Time: 05/27/19  1:31 PM  Specimen: Nasopharyngeal Swab  Result Value Ref Range Status   SARS Coronavirus 2 NEGATIVE NEGATIVE Final    Comment: (NOTE) SARS-CoV-2 target nucleic acids are NOT DETECTED. The SARS-CoV-2 RNA is generally detectable in upper and lower respiratory specimens during the acute phase of infection. Negative results do not preclude SARS-CoV-2 infection, do not rule out co-infections with other pathogens, and should not be used as the sole basis for treatment or other patient management decisions. Negative results must be combined with clinical observations, patient history, and epidemiological information. The expected result is Negative. Fact Sheet for Patients: SugarRoll.be Fact Sheet for Healthcare Providers: https://www.woods-mathews.com/ This test is not yet approved or cleared by the [place] FDA and  has been authorized for detection and/or diagnosis of SARS-CoV-2 by FDA under an Emergency Use Authorization (EUA).  This EUA will remain  in effect (meaning this test can be used) for the duration of the COVID-19 declaration under Section 56 4(b)(1) of the Act, 21 U.S.C. section 360bbb-3(b)(1), unless the authorization is terminated or revoked sooner. Performed at [facility], [place] [address], [place], [place] [address]          Radiology Studies: DG Abd 1 View  Result Date: 05/31/2019 CLINICAL DATA:  History of pancreatitis EXAM: ABDOMEN - 1 VIEW COMPARISON:  05/29/2019 FINDINGS: Scattered large and small bowel gas is noted improved when compared with the prior exam. No free air is seen. No obstructive changes are noted. Changes consistent with prior cholecystectomy are seen. No bony abnormality is noted. IMPRESSION: Scattered bowel gas without dilatation. Electronically Signed   By: Inez Catalina M.D.   On: 05/31/2019 09:46        Scheduled Meds: . lipase/protease/amylase  36,000 Units Oral TID AC  . lisinopril  10 mg Oral Daily  . metoCLOPramide (REGLAN) injection  5 mg Intravenous TID AC  . ondansetron (ZOFRAN) IV  4 mg Intravenous Q8H  . sodium chloride flush  3 mL Intravenous Once   Continuous Infusions:   LOS: 3 days    Time spent: 35 minutes    Elie Confer, MD [facility] Pager [phone]   If 7PM-7AM, please contact night-coverage [email or web address] Password [facility] 05/31/2019, 11:37 AM

## 2019-06-01 ENCOUNTER — Inpatient Hospital Stay (HOSPITAL_COMMUNITY): Payer: Managed Care, Other (non HMO)

## 2019-06-01 LAB — COMPREHENSIVE METABOLIC PANEL
ALT: 50 U/L — ABNORMAL HIGH (ref 0–44)
AST: 51 U/L — ABNORMAL HIGH (ref 15–41)
Albumin: 3.2 g/dL — ABNORMAL LOW (ref 3.5–5.0)
Alkaline Phosphatase: 76 U/L (ref 38–126)
Anion gap: 11 (ref 5–15)
BUN: 9 mg/dL (ref 6–20)
CO2: 25 mmol/L (ref 22–32)
Calcium: 9.4 mg/dL (ref 8.9–10.3)
Chloride: 100 mmol/L (ref 98–111)
Creatinine, Ser: 1.32 mg/dL — ABNORMAL HIGH (ref 0.61–1.24)
GFR calc Af Amer: >60 mL/min (ref >60)
GFR calc non Af Amer: >60 mL/min (ref >60)
Glucose, Bld: 105 mg/dL — ABNORMAL HIGH (ref 70–99)
Potassium: 3.8 mmol/L (ref 3.5–5.1)
Sodium: 136 mmol/L (ref 135–145)
Total Bilirubin: 1.2 mg/dL (ref 0.3–1.2)
Total Protein: 6.9 g/dL (ref 6.5–8.1)

## 2019-06-01 LAB — CBC
HCT: 38 % — ABNORMAL LOW (ref 39.0–52.0)
Hemoglobin: 12.6 g/dL — ABNORMAL LOW (ref 13.0–17.0)
MCH: 32.1 pg (ref 26.0–34.0)
MCHC: 33.2 g/dL (ref 30.0–36.0)
MCV: 96.9 fL (ref 80.0–100.0)
Platelets: 195 10*3/uL (ref 150–400)
RBC: 3.92 MIL/uL — ABNORMAL LOW (ref 4.22–5.81)
RDW: 12.8 % (ref 11.5–15.5)
WBC: 6 10*3/uL (ref 4.0–10.5)
nRBC: 0 % (ref 0.0–0.2)

## 2019-06-01 LAB — PHOSPHORUS: Phosphorus: 3.5 mg/dL (ref 2.5–4.6)

## 2019-06-01 LAB — MAGNESIUM: Magnesium: 1.9 mg/dL (ref 1.7–2.4)

## 2019-06-01 MED ORDER — HYDROMORPHONE HCL 1 MG/ML IJ SOLN
1.0000 mg | INTRAMUSCULAR | Status: DC | PRN
  Administered 2019-06-01 – 2019-06-03 (×9): 1 mg via INTRAVENOUS
  Filled 2019-06-01 (×9): qty 1

## 2019-06-01 MED ORDER — GADOBUTROL 1 MMOL/ML IV SOLN
10.0000 mL | Freq: Once | INTRAVENOUS | Status: AC | PRN
  Administered 2019-06-01: 10 mL via INTRAVENOUS

## 2019-06-01 MED ORDER — HYDROMORPHONE HCL 1 MG/ML IJ SOLN
1.0000 mg | Freq: Four times a day (QID) | INTRAMUSCULAR | Status: DC | PRN
  Administered 2019-06-01: 1 mg via INTRAVENOUS
  Filled 2019-06-01: qty 1

## 2019-06-01 NOTE — Progress Notes (Signed)
[facility] Progress Note    Since last GI note: Still with considerable abdominal pain. This is limiting his PO intake and abdility to D/c. He is ambulating in halls, passing gas. Does not appear toxic.  Objective: Vital signs in last 24 hours: Temp:  [98 F (36.7 C)-98.9 F (37.2 C)] 98.7 F (37.1 C) (02/15 0547) Pulse Rate:  [62-70] 70 (02/15 0547) Resp:  [15-18] 16 (02/15 0547) BP: (131-157)/(86-94) 131/91 (02/15 0547) SpO2:  [95 %-96 %] 96 % (02/15 0547) Last BM Date: 05/30/19 General: alert and oriented times 3 Heart: regular rate and rythm Abdomen: soft, mildly tender to deep palpation only, non-distended, normal bowel sounds   Lab Results: Recent Labs    05/30/19 0629 05/31/19 0226 06/01/19 0338  WBC 6.3 7.0 6.0  HGB 12.6* 12.1* 12.6*  PLT 172 180 195  MCV 99.0 95.7 96.9   Recent Labs    05/30/19 0629 05/31/19 0226 06/01/19 0338  NA 137 134* 136  K 3.7 3.7 3.8  CL 100 98 100  CO2 29 25 25   GLUCOSE 105* 114* 105*  BUN 5* 7 9  CREATININE 1.15 1.09 1.32*  CALCIUM 9.0 8.8* 9.4   Recent Labs    05/31/19 0226 06/01/19 0338  PROT 6.3* 6.9  ALBUMIN 3.0* 3.2*  AST 40 51*  ALT 37 50*  ALKPHOS 68 76  BILITOT 0.8 1.2     Medications: Scheduled Meds: .  HYDROmorphone (DILAUDID) injection  1 mg Intravenous Q8H  . lipase/protease/amylase  36,000 Units Oral TID AC  . lisinopril  40 mg Oral Daily  . metoCLOPramide  5 mg Oral TID AC  . sodium chloride flush  3 mL Intravenous Once   Continuous Infusions: PRN Meds:.acetaminophen **OR** [DISCONTINUED] acetaminophen, hydrALAZINE, ondansetron (ZOFRAN) IV, oxyCODONE-acetaminophen, prochlorperazine   Assessment/Plan: 49 y.o. male with acute panreatitis, slowly resolving.  Recurrent acute (on chronic?) pancreatitis.  At admission, his LFTs were all completely normal. This argues against a biliary cause (microlithiasis, sludge in bile duct) as a cause of his current illness.   Pancreatic divisim  anatomy on MR.  This is present in about 10% if the population and it is very unclear if this is causing any of his problems.  Since he is still having quite a lot of pain (however not too tender except on deep palpation) I think repeat MR is reasonable now. This will allow [place] to again make sure he has no mass lesions in his pancreas, confirm divisim anatomy, and gives a fairly good idea for retained CBD sludge, stones. I will order it.  Will follow along.  Milus Banister, MD  06/01/2019, 8:37 AM [facility] Pager [phone]

## 2019-06-01 NOTE — Progress Notes (Addendum)
PROGRESS NOTE    Kyle Berry  SWN:[id number] DOB: [birth year] DOA: 05/27/2019 PCP: [name] (Inactive)   Brief Narrative:  Patient is a 49 year old male with a medical history significant for recurrent pancreatitis, hypertension, reflux, congenital single kidney, and elevated cholesterol who presented on 2/10 for worsening abdominal pain and vomiting. Symptoms started 4-5 days prior to admission and reminded him of prior pancreatitis episodes. No alcohol use for 2 years prior, did have some loose bowel movements over 2 days. He was not have any fevers, chest pain, or shortness of breath upon evaluation. Mild elevation of lipase in the ER. Ultrasound showed poor visualization of the pancreas with mild dilatation of the pancreatic duct; also showed evidence of surgical removal of gallbladder.  06/01/2019 More pain with eating, will change back to liquid diet and GI ordered MRI today to reassess pancreas/biliary system.  Assessment & Plan:   Principal Problem:   Acute on chronic pancreatitis (HCC) Active Problems:   Essential hypertension   Esophageal reflux   Nausea and vomiting   Congenital single kidney   High cholesterol   Pancreatic divisum   Acute pancreatitis   Pancreatic insufficiency  Acute on chronic pancreatitis/ileus: -Hx chronic pancreatitis -lipase 345 on admit -will change full diet to clear liquid today given significant pain and patient requests -GI following and recommended MRI today to assess pancreas and biliary system -thought to have pancreatic divisum -pain management with IV dilaudid and percocet -still needing IV pain medication and worsening clinical condition today -on Creon -continue with metoclopramide  Essential hypertension: -Patient takes telmisartan 40 mg daily at home -Will initiate lisinopril 40 mg daily and optimize as blood pressure permits  Unilateral kidney, congenital: -renal function normal -continue to monitor  and avoid nephrotoxic agents.  DVT prophylaxis: SCDs Code Status: full Family Communication: patient only Disposition Plan:  . Patient came from:home            . Anticipated d/c place:home . Barriers to d/c OR conditions which need to be met to effect a safe d/c: worsening abdominal pain today which caused regression to clear liquid diet, getting MRI to assess today. Clinical tolerance of food required prior to D/C   Consultants:   GI  Antimicrobials:   none  Subjective: Not feeling well today, last night pain after eating. He felt he needed to stop eating as this bothered his stomach. Tried to eat some this morning and that caused significant pain as well. Had been trying percocet for pain and this helped but only when more mild to moderate. Did not help today but the IV dilaudid was more effective. Wants to go back to liquid diet as he is not tolerating regular diet.   Objective: Vitals:   05/31/19 1203 05/31/19 1743 05/31/19 2322 06/01/19 0547  BP: (!) 150/93 (!) 157/94 (!) 143/86 (!) 131/91  Pulse: 68 65 62 70  Resp: 18 18 15 16   Temp: 98.7 F (37.1 C) 98 F (36.7 C) 98.9 F (37.2 C) 98.7 F (37.1 C)  TempSrc: Oral Oral Oral Oral  SpO2: 96% 96% 95% 96%  Weight:      Height:        Intake/Output Summary (Last 24 hours) at 06/01/2019 1232 Last data filed at 06/01/2019 0330 Gross per 24 hour  Intake 240 ml  Output --  Net 240 ml   Filed Weights   05/29/19 0855  Weight: 77.6 kg    Examination:  General exam: Appears calm and comfortable  Respiratory system: Clear  to auscultation. Respiratory effort normal. Cardiovascular system: S1 & S2 heard, RRR. No JVD, murmurs, rubs, gallops or clicks. No pedal edema. Gastrointestinal system: Abdomen is nondistended, soft and tender to palpation upper. No organomegaly or masses felt. Normal bowel sounds heard. Central nervous system: Alert and oriented. No focal neurological deficits. Extremities: Symmetric 5 x 5  power. Skin: No rashes, lesions or ulcers Psychiatry: Judgement and insight appear normal. Mood & affect appropriate.     Data Reviewed: I have personally reviewed following labs and imaging studies  CBC: Recent Labs  Lab 05/28/19 1836 05/29/19 0553 05/30/19 0629 05/31/19 0226 06/01/19 0338  WBC 8.5 7.5 6.3 7.0 6.0  HGB 13.0 12.2* 12.6* 12.1* 12.6*  HCT 38.9* 36.5* 38.9* 35.9* 38.0*  MCV 97.3 97.1 99.0 95.7 96.9  PLT 188 180 172 180 165   Basic Metabolic Panel: Recent Labs  Lab 05/28/19 1836 05/29/19 0553 05/30/19 0629 05/31/19 0226 06/01/19 0338  NA 136 137 137 134* 136  K 4.0 3.7 3.7 3.7 3.8  CL 102 100 100 98 100  CO2 27 26 29 25 25   GLUCOSE 113* 92 105* 114* 105*  BUN 6 7 5* 7 9  CREATININE 1.07 1.06 1.15 1.09 1.32*  CALCIUM 8.5* 8.5* 9.0 8.8* 9.4  MG  --   --   --  1.9 1.9  PHOS  --   --   --  2.9 3.5   GFR: Estimated Creatinine Clearance: 74.3 mL/min (A) (by C-G formula based on SCr of 1.32 mg/dL (H)). Liver Function Tests: Recent Labs  Lab 05/27/19 0726 05/28/19 1836 05/31/19 0226 06/01/19 0338  AST 33 42* 40 51*  ALT 39 43 37 50*  ALKPHOS 51 63 68 76  BILITOT 0.9 1.4* 0.8 1.2  PROT 7.1 6.1* 6.3* 6.9  ALBUMIN 3.9 3.2* 3.0* 3.2*   Recent Labs  Lab 05/27/19 0726 05/28/19 0137 05/29/19 0553  LIPASE 76* 345* 166*  AMYLASE 66  --   --    No results for input(s): AMMONIA in the last 168 hours. Coagulation Profile: No results for input(s): INR, PROTIME in the last 168 hours. Cardiac Enzymes: No results for input(s): CKTOTAL, CKMB, CKMBINDEX, TROPONINI in the last 168 hours. BNP (last 3 results) No results for input(s): PROBNP in the last 8760 hours. HbA1C: No results for input(s): HGBA1C in the last 72 hours. CBG: No results for input(s): GLUCAP in the last 168 hours. Lipid Profile: No results for input(s): CHOL, HDL, LDLCALC, TRIG, CHOLHDL, LDLDIRECT in the last 72 hours. Thyroid Function Tests: No results for input(s): TSH, T4TOTAL,  FREET4, T3FREE, THYROIDAB in the last 72 hours. Anemia Panel: No results for input(s): VITAMINB12, FOLATE, FERRITIN, TIBC, IRON, RETICCTPCT in the last 72 hours. Sepsis Labs: No results for input(s): PROCALCITON, LATICACIDVEN in the last 168 hours.  Recent Results (from the past 240 hour(s))  SARS CORONAVIRUS 2 (TAT 6-24 HRS) Nasopharyngeal Nasopharyngeal Swab     Status: None   Collection Time: 05/27/19  1:31 PM   Specimen: Nasopharyngeal Swab  Result Value Ref Range Status   SARS Coronavirus 2 NEGATIVE NEGATIVE Final    Comment: (NOTE) SARS-CoV-2 target nucleic acids are NOT DETECTED. The SARS-CoV-2 RNA is generally detectable in upper and lower respiratory specimens during the acute phase of infection. Negative results do not preclude SARS-CoV-2 infection, do not rule out co-infections with other pathogens, and should not be used as the sole basis for treatment or other patient management decisions. Negative results must be combined with clinical observations, patient  history, and epidemiological information. The expected result is Negative. Fact Sheet for Patients: SugarRoll.be Fact Sheet for Healthcare Providers: https://www.woods-mathews.com/ This test is not yet approved or cleared by the [place] FDA and  has been authorized for detection and/or diagnosis of SARS-CoV-2 by FDA under an Emergency Use Authorization (EUA). This EUA will remain  in effect (meaning this test can be used) for the duration of the COVID-19 declaration under Section 56 4(b)(1) of the Act, 21 U.S.C. section 360bbb-3(b)(1), unless the authorization is terminated or revoked sooner. Performed at [facility], [place] [address] [place], [place] [address]     Radiology Studies: DG Abd 1 View  Result Date: 05/31/2019 CLINICAL DATA:  History of pancreatitis EXAM: ABDOMEN - 1 VIEW COMPARISON:  05/29/2019 FINDINGS: Scattered large and small bowel gas is  noted improved when compared with the prior exam. No free air is seen. No obstructive changes are noted. Changes consistent with prior cholecystectomy are seen. No bony abnormality is noted. IMPRESSION: Scattered bowel gas without dilatation. Electronically Signed   By: Inez Catalina M.D.   On: 05/31/2019 09:46   Scheduled Meds: . lipase/protease/amylase  36,000 Units Oral TID AC  . lisinopril  40 mg Oral Daily  . metoCLOPramide  5 mg Oral TID AC  . sodium chloride flush  3 mL Intravenous Once   Continuous Infusions:   LOS: 4 days   Time spent: [place], MD [facility]  To contact the attending provider between 7A-7P or the covering provider during after hours 7P-7A, please log into the web site [email or web address] and access using universal [place] password for that web site. If you do not have the password, please call the hospital operator.  06/01/2019, 12:32 PM

## 2019-06-02 DIAGNOSIS — K8500 Idiopathic acute pancreatitis without necrosis or infection: Principal | ICD-10-CM

## 2019-06-02 LAB — BASIC METABOLIC PANEL
Anion gap: 13 (ref 5–15)
BUN: 10 mg/dL (ref 6–20)
CO2: 24 mmol/L (ref 22–32)
Calcium: 9.5 mg/dL (ref 8.9–10.3)
Chloride: 100 mmol/L (ref 98–111)
Creatinine, Ser: 1.23 mg/dL (ref 0.61–1.24)
GFR calc Af Amer: >60 mL/min (ref >60)
GFR calc non Af Amer: >60 mL/min (ref >60)
Glucose, Bld: 104 mg/dL — ABNORMAL HIGH (ref 70–99)
Potassium: 4.2 mmol/L (ref 3.5–5.1)
Sodium: 137 mmol/L (ref 135–145)

## 2019-06-02 LAB — CBC
HCT: 39.4 % (ref 39.0–52.0)
Hemoglobin: 13.3 g/dL (ref 13.0–17.0)
MCH: 32.7 pg (ref 26.0–34.0)
MCHC: 33.8 g/dL (ref 30.0–36.0)
MCV: 96.8 fL (ref 80.0–100.0)
Platelets: 192 10*3/uL (ref 150–400)
RBC: 4.07 MIL/uL — ABNORMAL LOW (ref 4.22–5.81)
RDW: 12.9 % (ref 11.5–15.5)
WBC: 6 10*3/uL (ref 4.0–10.5)
nRBC: 0 % (ref 0.0–0.2)

## 2019-06-02 NOTE — Progress Notes (Addendum)
Daily Rounding Note  06/02/2019, 9:44 AM  LOS: 5 days   SUBJECTIVE:   Chief complaint: Acute pancreatitis.      Some elevated diastolic BPs as high as 123456.  No fevers. Pain improved.  Yesterday was constant at a 9/10 level.  Today it is down to about a 5/10 level and not as constant.  No nausea.  Tolerating clear liquids and would like to skip full liquids and proceed to solid food. Received 4 mg Dilaudid, 15 mg oxycodone yesterday, 3 mg Dilaudid thus far today. Softer stool yesterday.  OBJECTIVE:         Vital signs in last 24 hours:    Temp:  [98.2 F (36.8 C)-98.8 F (37.1 C)] 98.2 F (36.8 C) (02/16 0542) Pulse Rate:  [56-72] 58 (02/16 0542) Resp:  [16-18] 16 (02/16 0542) BP: (127-156)/(84-105) 127/84 (02/16 0542) SpO2:  [94 %-97 %] 94 % (02/16 0542) Last BM Date: 06/01/19(pt stated around noon) [facility] Weights   05/29/19 0855  Weight: 77.6 kg   General: Pleasant, alert, comfortable, nontoxic, well-appearing Heart: RRR. Chest: Clear bilaterally.  No cough or labored breathing. Abdomen: Soft, nondistended.  No tenderness.  Active bowel sounds. Extremities: No CCE. Neuro/Psych: No gross deficits, good historian, fluid speech.   Lab Results: Recent Labs    05/31/19 0226 06/01/19 0338 06/02/19 0402  WBC 7.0 6.0 6.0  HGB 12.1* 12.6* 13.3  HCT 35.9* 38.0* 39.4  PLT 180 195 192   BMET Recent Labs    05/31/19 0226 06/01/19 0338 06/02/19 0402  NA 134* 136 137  K 3.7 3.8 4.2  CL 98 100 100  CO2 25 25 24   GLUCOSE 114* 105* 104*  BUN 7 9 10   CREATININE 1.09 1.32* 1.23  CALCIUM 8.8* 9.4 9.5   LFT Recent Labs    05/31/19 0226 06/01/19 0338  PROT 6.3* 6.9  ALBUMIN 3.0* 3.2*  AST 40 51*  ALT 37 50*  ALKPHOS 68 76  BILITOT 0.8 1.2   Scheduled Meds: . lipase/protease/amylase  36,000 Units Oral TID AC  . lisinopril  40 mg Oral Daily  . metoCLOPramide  5 mg Oral TID AC  . sodium chloride flush  3  mL Intravenous Once   Continuous Infusions: PRN Meds:.acetaminophen **OR** [DISCONTINUED] acetaminophen, hydrALAZINE, HYDROmorphone (DILAUDID) injection, ondansetron (ZOFRAN) IV, oxyCODONE-acetaminophen, prochlorperazine   ASSESMENT:   *     Current acute pancreatitis,?  Chronic pancreatitis.  Etiology has not been established currently or in the past.  Initially EtOH suspected but recurred despite abstinence. Pancreatitis recurred despite discontinuation of ACEi (Lisinopril now in place).  Negative IgG 4 ruled out autoimmune pancreatitis.  Low suspicion for biliary cause given mostly normal LFTs (however LFTs of 2/11, 2/15 mildly elevated).  MRI/MRCP with pancreas divisum, pancreatic atrophy and dilated pancreatic duct.  Laparoscopic cholecystectomy 2019. Lipase  345 >> 166.  06/01/2019 MRI/MRCP: Focal pancreatitis at the tail, no underlying mass.  14 mm lesion medial to the pancreatic tail favoring benign splenule, increased prominence compared to 01/2019.  Consider EUS for further evaluation if warranted. Chronic Creon restarted.     PLAN   *    Advanced diet to heart healthy.  Stopped scheduled oral Reglan.   Azucena Freed  06/02/2019, 9:44 AM Phone [phone]   ________________________________________________________________________  Velora Heckler GI MD note:  I personally examined the patient, reviewed the data and agree with the assessment and plan described above. MRI/MRCP yesterday showed some minor tail of  pancreas inflammation, no mass lesions, no CBD pathology.  He is improving clinically. Ambulating in halls, tolerated BF this morning.  Seems OK for d/c later today if he tolerates lunch.  We will arrange follow up OV with Dr. Silverio Decamp in next several weeks.  Pending his clinical course he may be a candidate for minor sphincterotomy at some point. Dr. Rush Landmark to review his images.   Owens Loffler, MD [facility] Pager [phone]

## 2019-06-02 NOTE — Progress Notes (Signed)
PROGRESS NOTE    Kyle Berry  DJM:[id number] DOB: [birth year] DOA: 05/27/2019 PCP: [name] (Inactive)  Brief Narrative: Patient is a 49 year old male with a medical history significant for recurrent pancreatitis, hypertension, reflux, congenital single kidney, and elevated cholesterol who presented on 2/10 for worsening abdominal pain and vomiting. Symptoms started 4-5 days prior to admission and reminded him of prior pancreatitis episodes. No alcohol use for 2 years prior, did have some loose bowel movements over 2 days. He was not have any fevers, chest pain, or shortness of breath upon evaluation. Mild elevation of lipase in the ER. Ultrasound showed poor visualization of the pancreas with mild dilatation of the pancreatic duct; also showed evidence of surgical removal of gallbladder.  Assessment & Plan:   Principal Problem:   Acute on chronic pancreatitis (HCC) Active Problems:   Essential hypertension   Esophageal reflux   Nausea and vomiting   Congenital single kidney   High cholesterol   Pancreatic divisum   Acute pancreatitis   Pancreatic insufficiency  Acute on chronic pancreatitis/ileus: -Hx chronic pancreatitis -lipase 345 on admit -will change back to full diet today given significant improvement in pain on clear liquid yesterday -GI following and MRI repeat done without new findings -pain management with IV dilaudid and percocet -still needing IV pain medication and improving clinical status today -on Creon  Essential hypertension: -Patient takes telmisartan 40 mg daily at home -Will initiate lisinopril70m daily and optimize as blood pressure permits  Unilateral kidney, congenital: -renal function normal -continue to monitor and avoid nephrotoxic agents  DVT prophylaxis: SCDs Code Status: full Family Communication: patient only Disposition Plan:  . Patient came from:home            . Anticipated d/c place:home . Barriers to d/c  OR conditions which need to be met to effect a safe d/c:needs to be able to tolerate food without significant pain prior to D/C, advancing diet today   Consultants:   GI Antimicrobials:  none  Subjective: Feeling much better this morning on clear liquids. Not as much pain. Wants to review MRI results. Denies headaches, chest pains, SOB.   Objective: Vitals:   06/01/19 1240 06/01/19 1828 06/01/19 2356 06/02/19 0542  BP: 140/88 (!) 156/105 (!) 142/93 127/84  Pulse: 63 72 (!) 56 (!) 58  Resp: 18 18 16 16   Temp: 98.2 F (36.8 C) 98.8 F (37.1 C) 98.4 F (36.9 C) 98.2 F (36.8 C)  TempSrc: Oral Oral Oral Oral  SpO2: 96% 97% 95% 94%  Weight:      Height:        Intake/Output Summary (Last 24 hours) at 06/02/2019 1047 Last data filed at 06/02/2019 0100 Gross per 24 hour  Intake 125 ml  Output --  Net 125 ml   Filed Weights   05/29/19 0855  Weight: 77.6 kg    Examination:  General exam: Appears calm and comfortable  Respiratory system: Clear to auscultation. Respiratory effort normal. Cardiovascular system: S1 & S2 heard, RRR. No JVD, murmurs, rubs, gallops or clicks. No pedal edema. Gastrointestinal system: Abdomen is nondistended, soft and nontender. No organomegaly or masses felt. Normal bowel sounds heard. Central nervous system: Alert and oriented. No focal neurological deficits. Extremities: Symmetric 5 x 5 power. Skin: No rashes, lesions or ulcers Psychiatry: Judgement and insight appear normal. Mood & affect appropriate.  Data Reviewed: I have personally reviewed following labs and imaging studies  CBC: Recent Labs  Lab 05/29/19 0553 05/30/19 0629 05/31/19 0226 06/01/19 0338 06/02/19  0402  WBC 7.5 6.3 7.0 6.0 6.0  HGB 12.2* 12.6* 12.1* 12.6* 13.3  HCT 36.5* 38.9* 35.9* 38.0* 39.4  MCV 97.1 99.0 95.7 96.9 96.8  PLT 180 172 180 195 025   Basic Metabolic Panel: Recent Labs  Lab 05/29/19 0553 05/30/19 0629 05/31/19 0226 06/01/19 0338 06/02/19 0402   NA 137 137 134* 136 137  K 3.7 3.7 3.7 3.8 4.2  CL 100 100 98 100 100  CO2 26 29 25 25 24   GLUCOSE 92 105* 114* 105* 104*  BUN 7 5* 7 9 10   CREATININE 1.06 1.15 1.09 1.32* 1.23  CALCIUM 8.5* 9.0 8.8* 9.4 9.5  MG  --   --  1.9 1.9  --   PHOS  --   --  2.9 3.5  --    GFR: Estimated Creatinine Clearance: 79.7 mL/min (by C-G formula based on SCr of 1.23 mg/dL). Liver Function Tests: Recent Labs  Lab 05/27/19 0726 05/28/19 1836 05/31/19 0226 06/01/19 0338  AST 33 42* 40 51*  ALT 39 43 37 50*  ALKPHOS 51 63 68 76  BILITOT 0.9 1.4* 0.8 1.2  PROT 7.1 6.1* 6.3* 6.9  ALBUMIN 3.9 3.2* 3.0* 3.2*   Recent Labs  Lab 05/27/19 0726 05/28/19 0137 05/29/19 0553  LIPASE 76* 345* 166*  AMYLASE 66  --   --    No results for input(s): AMMONIA in the last 168 hours. Coagulation Profile: No results for input(s): INR, PROTIME in the last 168 hours. Cardiac Enzymes: No results for input(s): CKTOTAL, CKMB, CKMBINDEX, TROPONINI in the last 168 hours. BNP (last 3 results) No results for input(s): PROBNP in the last 8760 hours. HbA1C: No results for input(s): HGBA1C in the last 72 hours. CBG: No results for input(s): GLUCAP in the last 168 hours. Lipid Profile: No results for input(s): CHOL, HDL, LDLCALC, TRIG, CHOLHDL, LDLDIRECT in the last 72 hours. Thyroid Function Tests: No results for input(s): TSH, T4TOTAL, FREET4, T3FREE, THYROIDAB in the last 72 hours. Anemia Panel: No results for input(s): VITAMINB12, FOLATE, FERRITIN, TIBC, IRON, RETICCTPCT in the last 72 hours. Sepsis Labs: No results for input(s): PROCALCITON, LATICACIDVEN in the last 168 hours.  Recent Results (from the past 240 hour(s))  SARS CORONAVIRUS 2 (TAT 6-24 HRS) Nasopharyngeal Nasopharyngeal Swab     Status: None   Collection Time: 05/27/19  1:31 PM   Specimen: Nasopharyngeal Swab  Result Value Ref Range Status   SARS Coronavirus 2 NEGATIVE NEGATIVE Final    Comment: (NOTE) SARS-CoV-2 target nucleic acids are  NOT DETECTED. The SARS-CoV-2 RNA is generally detectable in upper and lower respiratory specimens during the acute phase of infection. Negative results do not preclude SARS-CoV-2 infection, do not rule out co-infections with other pathogens, and should not be used as the sole basis for treatment or other patient management decisions. Negative results must be combined with clinical observations, patient history, and epidemiological information. The expected result is Negative. Fact Sheet for Patients: SugarRoll.be Fact Sheet for Healthcare Providers: https://www.woods-mathews.com/ This test is not yet approved or cleared by the [place] FDA and  has been authorized for detection and/or diagnosis of SARS-CoV-2 by FDA under an Emergency Use Authorization (EUA). This EUA will remain  in effect (meaning this test can be used) for the duration of the COVID-19 declaration under Section 56 4(b)(1) of the Act, 21 U.S.C. section 360bbb-3(b)(1), unless the authorization is terminated or revoked sooner. Performed at [facility], [place] [address], [place],  [address]  Radiology Studies: MR ABDOMEN MRCP W WO CONTAST  Result Date: 06/01/2019 CLINICAL DATA:  Recurrent pancreatitis EXAM: MRI ABDOMEN WITHOUT AND WITH CONTRAST (INCLUDING MRCP) TECHNIQUE: Multiplanar multisequence MR imaging of the abdomen was performed both before and after the administration of intravenous contrast. Heavily T2-weighted images of the biliary and pancreatic ducts were obtained, and three-dimensional MRCP images were rendered by post processing. CONTRAST:  45m GADAVIST GADOBUTROL 1 MMOL/ML IV SOLN COMPARISON:  CT abdomen/pelvis dated 02/05/2019. MRI abdomen dated 04/10/2018. FINDINGS: Lower chest: Minimal dependent atelectasis at the lung bases. Hepatobiliary: Liver is within normal limits. No suspicious/enhancing hepatic lesions. No morphologic findings cirrhosis.  Status post cholecystectomy. No intrahepatic or extrahepatic ductal dilatation. No choledocholithiasis is seen. Pancreas: Enlargement of the distal pancreatic tail (series 6/image 16), with peripancreatic inflammatory changes/fluid. No convincing differential enhancement in this region to suggest an underlying mass. No pancreatic atrophy or ductal dilatation. Suspected pancreatic divisum, better evaluated on prior MR. Spleen: Within normal limits. 14 mm splenule medial to the pancreatic tail (series 6/image 14). This is more prominent on prior Adrenals/Urinary Tract:  Adrenal glands are within normal limits. Severe right renal atrophy with small cysts measuring up to 14 mm in the upper pole (series 4/image 20). Left kidney is within normal limits. No hydronephrosis. Stomach/Bowel: Stomach is within normal limits. Visualized bowel is notable for scattered right colonic diverticulosis, without evidence of diverticulitis. Vascular/Lymphatic:  No evidence of abdominal aortic aneurysm. No suspicious abdominal lymphadenopathy. Other:  No suspicious abdominal lymphadenopathy. Musculoskeletal: No focal osseous lesions. IMPRESSION: Suspected focal pancreatitis involving the pancreatic tail. No underlying mass is evident on MR. Consider EUS for further evaluation as clinically warranted. 14 mm lesion medial to the pancreatic tail favors a benign splenule. This is more prominent than on prior MRI but is evident in retrospect on prior CTs, albeit smaller. Severe right renal atrophy. Electronically Signed   By: SJulian HyM.D.   On: 06/01/2019 19:28   Scheduled Meds: . lipase/protease/amylase  36,000 Units Oral TID AC  . lisinopril  40 mg Oral Daily  . sodium chloride flush  3 mL Intravenous Once   Continuous Infusions:   LOS: 5 days   Time spent: [place] MD [facility]  To contact the attending provider between 7A-7P or the covering provider during after hours 7P-7A, please log  into the web site [email or web address] and access using universal [place] password for that web site. If you do not have the password, please call the hospital operator.  06/02/2019, 10:47 AM

## 2019-06-03 DIAGNOSIS — I10 Essential (primary) hypertension: Secondary | ICD-10-CM

## 2019-06-03 DIAGNOSIS — E7800 Pure hypercholesterolemia, unspecified: Secondary | ICD-10-CM

## 2019-06-03 DIAGNOSIS — K219 Gastro-esophageal reflux disease without esophagitis: Secondary | ICD-10-CM

## 2019-06-03 DIAGNOSIS — K8689 Other specified diseases of pancreas: Secondary | ICD-10-CM

## 2019-06-03 DIAGNOSIS — Q600 Renal agenesis, unilateral: Secondary | ICD-10-CM

## 2019-06-03 LAB — CBC
HCT: 37.8 % — ABNORMAL LOW (ref 39.0–52.0)
Hemoglobin: 12.7 g/dL — ABNORMAL LOW (ref 13.0–17.0)
MCH: 32.3 pg (ref 26.0–34.0)
MCHC: 33.6 g/dL (ref 30.0–36.0)
MCV: 96.2 fL (ref 80.0–100.0)
Platelets: 214 10*3/uL (ref 150–400)
RBC: 3.93 MIL/uL — ABNORMAL LOW (ref 4.22–5.81)
RDW: 12.9 % (ref 11.5–15.5)
WBC: 5.8 10*3/uL (ref 4.0–10.5)
nRBC: 0 % (ref 0.0–0.2)

## 2019-06-03 LAB — BASIC METABOLIC PANEL
Anion gap: 10 (ref 5–15)
BUN: 12 mg/dL (ref 6–20)
CO2: 26 mmol/L (ref 22–32)
Calcium: 9.5 mg/dL (ref 8.9–10.3)
Chloride: 102 mmol/L (ref 98–111)
Creatinine, Ser: 1.29 mg/dL — ABNORMAL HIGH (ref 0.61–1.24)
GFR calc Af Amer: >60 mL/min (ref >60)
GFR calc non Af Amer: >60 mL/min (ref >60)
Glucose, Bld: 120 mg/dL — ABNORMAL HIGH (ref 70–99)
Potassium: 3.9 mmol/L (ref 3.5–5.1)
Sodium: 138 mmol/L (ref 135–145)

## 2019-06-03 MED ORDER — OXYCODONE-ACETAMINOPHEN 5-325 MG PO TABS: 1.0000 | ORAL_TABLET | Freq: Four times a day (QID) | ORAL | 0 refills | Status: DC | PRN

## 2019-06-03 NOTE — Discharge Summary (Signed)
Physician Discharge Summary  [name] [id number] DOB: 1970-10-07 DOA: 05/27/2019  PCP: [name] (Inactive)  Admit date: 05/27/2019 Discharge date: 06/03/2019  Time spent: 45 minutes  Recommendations for Outpatient Follow-up:  Patient will be discharged to home.  Patient will need to follow up with primary care provider within one week of discharge.  Follow up with gastroenterology. Patient should continue medications as prescribed.  Patient should follow a heart healthy diet.   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 Wynetta Fines Kyle Berry is a 49 y.o. 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 His symptoms started about 64-5 days ago located around epigastric abdominal pain which radiates to his back, very similar to his prior pancreatitis episodes.  Episodic like,from yesterday the abdominal pain started to get worse, around 2:00 this morning he woke up with severe abdominal pain, with strong feeling nauseous and vomit 5-6 times, no fever chills.  And he has had multiple episodes of loose bowel movement for last 2 days.He has not used alcohol for about 2 years.   No chest pain, short of breath, dysuria, no hematuria.  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  14 mm lesion medial to the pancreatic tail favors benign splenule. -Was placed on IV pain control on Percocet, Creon -Diet has been advanced to heart healthy, patient has tolerated well -Patient to follow-up with gastroenterology as an outpatient -will discharge patient home with limited pain medications  Essential hypertension -Continue micardis on discharge (home medications)  Unilateral kidney, congenital -Renal function appears to be stable  Procedures: MRCP  Consultations: Gastroenterology   Discharge Exam: Vitals:   06/02/19 2310 06/03/19 0544  BP: (!) 131/97 127/90  Pulse: 61 68  Resp: 18 18  Temp: 98.2 F (36.8 C) 98.4 F (36.9 C)  SpO2: 97% 98%     General: Well developed, well nourished, NAD, appears stated age  HEENT: NCAT, mucous membranes moist.  Cardiovascular: S1 S2 auscultated, RRR, no murmur  Respiratory: Clear to auscultation bilaterally  Abdomen: Soft, nontender, nondistended, + bowel sounds  Extremities: warm dry without cyanosis clubbing or edema  Neuro: AAOx3, nonfocal  Psych: Appropriate mood and affect  Discharge Instructions Discharge Instructions    Discharge instructions   Complete by: As directed    Patient will be discharged to home.  Patient will need to follow up with primary care provider within one week of discharge.  Follow up with gastroenterology. Patient should continue medications as prescribed.  Patient should follow a heart healthy diet.     Allergies as of 06/03/2019      Reactions   Diclofenac Hives   Norvasc [amlodipine Besylate] Other (See Comments)   Fluid buildup in chest   Omeprazole Other (See Comments)   MD stopped due to pancreatitis   Prednisone Other (See Comments)   Mood swings      Medication List    TAKE these medications   ibuprofen 800 MG tablet  Commonly known as: ADVIL Take 1 tablet (800 mg total) by mouth every 8 (eight) hours as needed.     lipase/protease/amylase 36000 UNITS Cpep capsule Commonly known as: Creon Take 1 capsule (36,000 Units total) by mouth See admin instructions. Take one capsules (36,000 units) by mouth with meals (2 meals daily) and one capsule (36000 units) with snacks daily   ondansetron 4 MG disintegrating tablet Commonly known as: Zofran ODT Take 1 tablet (4 mg total) by mouth every 8 (eight) hours as needed for nausea or vomiting.   oxyCODONE-acetaminophen 5-325 MG tablet Commonly known as: PERCOCET/ROXICET Take 1 tablet by mouth every 6 (six) hours as needed for moderate pain.   telmisartan 40 MG tablet Commonly known as: MICARDIS Take 40 mg by mouth daily.      Allergies  Allergen Reactions  . Diclofenac Hives  . Norvasc [Amlodipine Besylate] Other (See Comments)    Fluid buildup in chest  . Omeprazole Other (See Comments)    MD stopped due to pancreatitis  . Prednisone Other (See Comments)    Mood swings   Follow-up Information    Willia Craze, NP Follow up on 06/30/2019.   Specialty: Gastroenterology Why: 11:30 Am for follow up of pancreatitis.   Contact information: [place] [address] [phone]        [name]. Schedule an appointment as soon as possible for a visit in 1 week(s).   Why: Hospital follow up Contact information: [facility] [place] [address] [phone]            The results of significant diagnostics from this hospitalization (including imaging, microbiology, ancillary and laboratory) are listed below for reference.    Significant Diagnostic Studies: DG Abd 1 View  Result Date: 05/31/2019 CLINICAL DATA:  History of pancreatitis EXAM: ABDOMEN - 1 VIEW COMPARISON:  05/29/2019 FINDINGS: Scattered large and small bowel gas is noted improved when compared with the prior exam. No free air is seen. No obstructive changes are noted. Changes consistent with prior  cholecystectomy are seen. No bony abnormality is noted. IMPRESSION: Scattered bowel gas without dilatation. Electronically Signed   By: Inez Catalina M.D.   On: 05/31/2019 09:46   US Abdomen Complete  Result Date: 05/27/2019 CLINICAL DATA:  Acute pancreatitis. EXAM: ABDOMEN ULTRASOUND COMPLETE COMPARISON:  Abdominal CT 02/04/2019, MRCP 04/09/2018 FINDINGS: Gallbladder: Surgically absent. Common bile duct: Diameter: 9 mm, normal for postcholecystectomy state. Liver: No focal lesion identified. Within normal limits in parenchymal echogenicity. Portal vein is patent on color Doppler imaging with normal direction of blood flow towards the liver. IVC: No abnormality visualized. Pancreas: Not well visualized sonographically. Suggestion of parenchymal atrophy. There is mild dilatation of the pancreatic duct measuring 4 mm. No obvious peripancreatic fluid collection. Spleen: Size and appearance within normal limits. Right Kidney: Not visualized. Prior imaging demonstrated markedly atrophic kidney, this is not well apparent sonographically. Left Kidney: Length: 14.4 cm. 5 mm nonobstructing calculus in the lower kidney. Echogenicity within normal limits. No mass or hydronephrosis visualized. Abdominal aorta: No aneurysm visualized. Other findings: No free fluid visualized. IMPRESSION: 1. Pancreas is not well-defined sonographically. Mild dilatation of the pancreatic duct of 4 mm. No sonographic evidence of focal fluid collection or free fluid in the abdomen. 2. Post cholecystectomy. Mild biliary prominence, normal postcholecystectomy. 3. Right kidney not visualized, markedly atrophic on prior imaging. Mild compensatory left renal hypertrophy. Nonobstructing stone in the lower left kidney. Electronically Signed   By:  Keith Rake M.D.   On: 05/27/2019 14:09   DG Abd 2 Views  Result Date: 05/29/2019 CLINICAL DATA:  Pancreatitis. Generalized abdominal pain. EXAM: ABDOMEN - 2 VIEW COMPARISON:  CT of the abdomen and  pelvis 10//20 FINDINGS: Fluid levels are present within nondilated loops of large and small bowel. Gas can be seen into the sigmoid colon rectum. No free air is present. Surgical clips are present gallbladder fossa. IMPRESSION: Fluid levels within nondilated loops of large and small bowel compatible with ileus. Electronically Signed   By: San Morelle M.D.   On: 05/29/2019 08:41   MR ABDOMEN MRCP W WO CONTAST  Result Date: 06/01/2019 CLINICAL DATA:  Recurrent pancreatitis EXAM: MRI ABDOMEN WITHOUT AND WITH CONTRAST (INCLUDING MRCP) TECHNIQUE: Multiplanar multisequence MR imaging of the abdomen was performed both before and after the administration of intravenous contrast. Heavily T2-weighted images of the biliary and pancreatic ducts were obtained, and three-dimensional MRCP images were rendered by post processing. CONTRAST:  63mL GADAVIST GADOBUTROL 1 MMOL/ML IV SOLN COMPARISON:  CT abdomen/pelvis dated 02/05/2019. MRI abdomen dated 04/10/2018. FINDINGS: Lower chest: Minimal dependent atelectasis at the lung bases. Hepatobiliary: Liver is within normal limits. No suspicious/enhancing hepatic lesions. No morphologic findings cirrhosis. Status post cholecystectomy. No intrahepatic or extrahepatic ductal dilatation. No choledocholithiasis is seen. Pancreas: Enlargement of the distal pancreatic tail (series 6/image 16), with peripancreatic inflammatory changes/fluid. No convincing differential enhancement in this region to suggest an underlying mass. No pancreatic atrophy or ductal dilatation. Suspected pancreatic divisum, better evaluated on prior MR. Spleen: Within normal limits. 14 mm splenule medial to the pancreatic tail (series 6/image 14). This is more prominent on prior Adrenals/Urinary Tract:  Adrenal glands are within normal limits. Severe right renal atrophy with small cysts measuring up to 14 mm in the upper pole (series 4/image 20). Left kidney is within normal limits. No hydronephrosis.  Stomach/Bowel: Stomach is within normal limits. Visualized bowel is notable for scattered right colonic diverticulosis, without evidence of diverticulitis. Vascular/Lymphatic:  No evidence of abdominal aortic aneurysm. No suspicious abdominal lymphadenopathy. Other:  No suspicious abdominal lymphadenopathy. Musculoskeletal: No focal osseous lesions. IMPRESSION: Suspected focal pancreatitis involving the pancreatic tail. No underlying mass is evident on MR. Consider EUS for further evaluation as clinically warranted. 14 mm lesion medial to the pancreatic tail favors a benign splenule. This is more prominent than on prior MRI but is evident in retrospect on prior CTs, albeit smaller. Severe right renal atrophy. Electronically Signed   By: Julian Hy M.D.   On: 06/01/2019 19:28    Microbiology: Recent Results (from the past 240 hour(s))  SARS CORONAVIRUS 2 (TAT 6-24 HRS) Nasopharyngeal Nasopharyngeal Swab     Status: None   Collection Time: 05/27/19  1:31 PM   Specimen: Nasopharyngeal Swab  Result Value Ref Range Status   SARS Coronavirus 2 NEGATIVE NEGATIVE Final    Comment: (NOTE) SARS-CoV-2 target nucleic acids are NOT DETECTED. The SARS-CoV-2 RNA is generally detectable in upper and lower respiratory specimens during the acute phase of infection. Negative results do not preclude SARS-CoV-2 infection, do not rule out co-infections with other pathogens, and should not be used as the sole basis for treatment or other patient management decisions. Negative results must be combined with clinical observations, patient history, and epidemiological information. The expected result is Negative. Fact Sheet for Patients: SugarRoll.be Fact Sheet for Healthcare Providers: https://www.woods-mathews.com/ This test is not yet approved or cleared by the [place] FDA and  has been authorized for detection and/or diagnosis  of SARS-CoV-2 by FDA under an  Emergency Use Authorization (EUA). This EUA will remain  in effect (meaning this test can be used) for the duration of the COVID-19 declaration under Section 56 4(b)(1) of the Act, 21 U.S.C. section 360bbb-3(b)(1), unless the authorization is terminated or revoked sooner. Performed at [facility], [place] [address], [place], [place] [address]      Labs: Basic Metabolic Panel: Recent Labs  Lab 05/30/19 [phone] 05/31/19 0226 06/01/19 0338 06/02/19 0402 06/03/19 0515  NA 137 134* 136 137 138  K 3.7 3.7 3.8 4.2 3.9  CL 100 98 100 100 102  CO2 29 25 25 24 26   GLUCOSE 105* 114* 105* 104* 120*  BUN 5* 7 9 10 12   CREATININE 1.15 1.09 1.32* 1.23 1.29*  CALCIUM 9.0 8.8* 9.4 9.5 9.5  MG  --  1.9 1.9  --   --   PHOS  --  2.9 3.5  --   --    Liver Function Tests: Recent Labs  Lab 05/28/19 1836 05/31/19 0226 06/01/19 0338  AST 42* 40 51*  ALT 43 37 50*  ALKPHOS 63 68 76  BILITOT 1.4* 0.8 1.2  PROT 6.1* 6.3* 6.9  ALBUMIN 3.2* 3.0* 3.2*   Recent Labs  Lab 05/28/19 0137 05/29/19 0553  LIPASE 345* 166*   No results for input(s): AMMONIA in the last 168 hours. CBC: Recent Labs  Lab 05/30/19 0629 05/31/19 0226 06/01/19 0338 06/02/19 0402 06/03/19 0515  WBC 6.3 7.0 6.0 6.0 5.8  HGB 12.6* 12.1* 12.6* 13.3 12.7*  HCT 38.9* 35.9* 38.0* 39.4 37.8*  MCV 99.0 95.7 96.9 96.8 96.2  PLT 172 180 195 192 214   Cardiac Enzymes: No results for input(s): CKTOTAL, CKMB, CKMBINDEX, TROPONINI in the last 168 hours. BNP: BNP (last 3 results) No results for input(s): BNP in the last 8760 hours.  ProBNP (last 3 results) No results for input(s): PROBNP in the last 8760 hours.  CBG: No results for input(s): GLUCAP in the last 168 hours.     Signed:  Cristal Ford  [facility] 06/03/2019, 8:34 AM

## 2019-06-03 NOTE — Discharge Instructions (Signed)
Acute Pancreatitis  The pancreas is a gland that is located behind the stomach on the left side of the abdomen. It produces enzymes that help to digest food. The pancreas also releases the hormones glucagon and insulin, which help to regulate blood sugar. Acute pancreatitis happens when inflammation of the pancreas suddenly occurs and the pancreas becomes irritated and swollen. Most acute attacks last a few days and cause serious problems. Some people become dehydrated and develop low blood pressure. In severe cases, bleeding in the abdomen can lead to shock and can be life-threatening. The lungs, heart, and kidneys may fail. What are the causes? This condition may be caused by:  Alcohol abuse.  Drug abuse.  Gallstones or other conditions that can block the tube that drains the pancreas (pancreatic duct).  A tumor in the pancreas. Other causes include:  Certain medicines.  Exposure to certain chemicals.  Diabetes.  An infection in the pancreas.  Damage caused by an accident (trauma).  The poison (venom) from a scorpion bite.  Abdominal surgery.  Autoimmune pancreatitis. This is when the body's disease-fighting (immune) system attacks the pancreas.  Genes that are passed from parent to child (inherited). In some cases, the cause of this condition is not known. What are the signs or symptoms? Symptoms of this condition include:  Pain in the upper abdomen that may radiate to the back. Pain may be severe.  Tenderness and swelling of the abdomen.  Nausea and vomiting.  Fever. How is this diagnosed? This condition may be diagnosed based on:  A physical exam.  Blood tests.  Imaging tests, such as X-rays, CT or MRI scans, or an ultrasound of the abdomen. How is this treated? Treatment for this condition usually requires a stay in the hospital. Treatment for this condition may include:  Pain medicine.  Fluid replacement through an IV.  Placing a tube in the stomach  to remove stomach contents and to control vomiting (NG tube, or nasogastric tube).  Not eating for 3-4 days. This gives the pancreas a rest, because enzymes are not being produced that can cause further damage.  Antibiotic medicines, if your condition is caused by an infection.  Treating any underlying conditions that may be the cause.  Steroid medicines, if your condition is caused by your immune system attacking your body's own tissues (autoimmune disease).  Surgery on the pancreas or gallbladder. Follow these instructions at home: Eating and drinking   Follow instructions from your health care provider about diet. This may involve avoiding alcohol and decreasing the amount of fat in your diet.  Eat smaller, more frequent meals. This reduces the amount of digestive fluids that the pancreas produces.  Drink enough fluid to keep your urine pale yellow.  Do not drink alcohol if it caused your condition. General instructions  Take over-the-counter and prescription medicines only as told by your health care provider.  Do not drive or use heavy machinery while taking prescription pain medicine.  Ask your health care provider if the medicine prescribed to you can cause constipation. You may need to take steps to prevent or treat constipation, such as: ? Take an over-the-counter or prescription medicine for constipation. ? Eat foods that are high in fiber such as whole grains and beans. ? Limit foods that are high in fat and processed sugars, such as fried or sweet foods.  Do not use any products that contain nicotine or tobacco, such as cigarettes, e-cigarettes, and chewing tobacco. If you need help quitting, ask your   health care provider.  Get plenty of rest.  If directed, check your blood sugar at home as told by your health care provider.  Keep all follow-up visits as told by your health care provider. This is important. Contact a health care provider if you:  Do not recover  as quickly as expected.  Develop new or worsening symptoms.  Have persistent pain, weakness, or nausea.  Recover and then have another episode of pain.  Have a fever. Get help right away if:  You cannot eat or keep fluids down.  Your pain becomes severe.  Your skin or the white part of your eyes turns yellow (jaundice).  You have sudden swelling in your abdomen.  You vomit.  You feel dizzy or you faint.  Your blood sugar is high (over 300 mg/dL). Summary  Acute pancreatitis happens when inflammation of the pancreas suddenly occurs and the pancreas becomes irritated and swollen.  This condition is typically caused by alcohol abuse, drug abuse, or gallstones.  Treatment for this condition usually requires a stay in the hospital. This information is not intended to replace advice given to you by your health care provider. Make sure you discuss any questions you have with your health care provider. Document Revised: 01/20/2018 Document Reviewed: 10/07/2017 Elsevier Patient Education  2020 Elsevier Inc.  

## 2019-06-12 ENCOUNTER — Other Ambulatory Visit: Payer: Self-pay

## 2019-06-12 ENCOUNTER — Ambulatory Visit (INDEPENDENT_AMBULATORY_CARE_PROVIDER_SITE_OTHER): Payer: Managed Care, Other (non HMO) | Admitting: Podiatry

## 2019-06-12 VITALS — Temp 96.7°F

## 2019-06-12 DIAGNOSIS — M2042 Other hammer toe(s) (acquired), left foot: Secondary | ICD-10-CM

## 2019-06-12 DIAGNOSIS — M2041 Other hammer toe(s) (acquired), right foot: Secondary | ICD-10-CM

## 2019-06-12 DIAGNOSIS — G588 Other specified mononeuropathies: Secondary | ICD-10-CM | POA: Diagnosis not present

## 2019-06-12 DIAGNOSIS — M7742 Metatarsalgia, left foot: Secondary | ICD-10-CM

## 2019-06-12 DIAGNOSIS — M7741 Metatarsalgia, right foot: Secondary | ICD-10-CM

## 2019-06-14 NOTE — Progress Notes (Signed)
  Subjective:  Patient ID: Kyle Berry, male    DOB: Nov 15, 1970,  MRN: [id number]  No chief complaint on file.   49 y.o. male presents for follow-up states that the injections helped for a few days but the pain returned shortly after.  Objective:  Physical Exam: warm, good capillary refill, no trophic changes or ulcerative lesions, normal DP and PT pulses and normal sensory exam. Left Foot: tenderness between the 2nd and 3rd metatarsal head and hammertoe deformity bilat with soft corns 3rd toe  Right Foot: tenderness between the 2nd and 3rd metatarsal head and hammertoe deformity bilat with soft corns 3rd toe  Assessment:   1. Interdigital neuroma     Plan:  Patient was evaluated and treated and all questions answered.  Hammertoe and Interdigital Neuroma -Repeat injection as below   Procedure: Neuroma Injection Location: Bilateral 2nd interspace Skin Prep: Alcohol. Injectate: 0.5 cc 0.5% marcaine plain, 0.5 cc celestone Disposition: Patient tolerated procedure well. Injection site dressed with a band-aid.   Return in about 3 weeks (around 06/12/2019).

## 2019-06-15 ENCOUNTER — Telehealth: Payer: Self-pay | Admitting: *Deleted

## 2019-06-15 DIAGNOSIS — M7742 Metatarsalgia, left foot: Secondary | ICD-10-CM

## 2019-06-15 DIAGNOSIS — M7741 Metatarsalgia, right foot: Secondary | ICD-10-CM

## 2019-06-15 DIAGNOSIS — G588 Other specified mononeuropathies: Secondary | ICD-10-CM

## 2019-06-15 NOTE — Telephone Encounter (Signed)
-----   Message from Evelina Bucy, DPM sent at 06/12/2019  4:06 PM EST ----- Can we order MRI R foot? Eval for interdigital neuroma

## 2019-06-15 NOTE — Telephone Encounter (Signed)
Orders to Dr. March Rummage assistant for pre-cert, faxed to [facility].

## 2019-06-17 NOTE — Telephone Encounter (Signed)
Jearld Fenton, [place] states pt's insurance request clinicals for pre-cert Case:  [id number], to be faxed to [phone]. Faxed clinicals and orders to [facility].

## 2019-06-26 ENCOUNTER — Ambulatory Visit: Payer: Managed Care, Other (non HMO) | Attending: Internal Medicine

## 2019-06-26 DIAGNOSIS — Z23 Encounter for immunization: Secondary | ICD-10-CM

## 2019-06-26 NOTE — Progress Notes (Signed)
   Covid-19 Vaccination Clinic  Name:  Kyle Berry    MRN: [id number] DOB: 09-24-1970  06/26/2019  [name] was observed post Covid-19 immunization for 15 minutes without incident. He was provided with Vaccine Information Sheet and instruction to access the V-Safe system.   [name] was instructed to call 911 with any severe reactions post vaccine: Marland Kitchen Difficulty breathing  . Swelling of face and throat  . A fast heartbeat  . A bad rash all over body  . Dizziness and weakness   Immunizations Administered    Name Date Dose VIS Date Route   Pfizer COVID-19 Vaccine 06/26/2019  9:50 AM 0.3 mL 03/27/2019 Intramuscular   Manufacturer: [place]   Lot: [id number]   [place]: [id number]

## 2019-06-29 NOTE — Telephone Encounter (Signed)
Kyle Berry  [id number] FOR CASE: [id number] MRI RIGHT FOOT WITH AND WITHOUT CONTRAST, VALID: 06/16/2019, END: 12/13/2019. Faxed to [facility].

## 2019-06-30 ENCOUNTER — Ambulatory Visit: Payer: Managed Care, Other (non HMO) | Admitting: Nurse Practitioner

## 2019-07-08 ENCOUNTER — Ambulatory Visit: Payer: Managed Care, Other (non HMO) | Admitting: Gastroenterology

## 2019-07-09 ENCOUNTER — Ambulatory Visit: Payer: Managed Care, Other (non HMO) | Admitting: Podiatry

## 2019-07-15 NOTE — Progress Notes (Signed)
  Subjective:  Patient ID: Kyle Berry, male    DOB: Jan 02, 1971,  MRN: [id number]  Chief Complaint  Patient presents with  . Follow-up    Bilateral foot pain - hammertoes and interdigital neuromas. Pt stated, "The pain is the same in the L foot - 10/10. The R foot is worse. I was hospitalized 1 week ago for pancreatitis, so I received an oxycodone Rx. I have only taken it twice for pancreatitis. It does not help the pain in my feet. I've also tried ibuprofen".    49 y.o. male presents for follow-up. Hx as above. Objective:  Physical Exam: warm, good capillary refill, no trophic changes or ulcerative lesions, normal DP and PT pulses and normal sensory exam. Left Foot: tenderness between the 2nd and 3rd metatarsal head and hammertoe deformity bilat with soft corns 3rd toe  Right Foot: tenderness between the 2nd and 3rd metatarsal head and hammertoe deformity bilat with soft corns 3rd toe  Assessment:   1. Interdigital neuroma   2. Hammertoes of both feet   3. Metatarsalgia of both feet     Plan:  Patient was evaluated and treated and all questions answered.  Hammertoe and Interdigital Neuroma -Order MRI R foot to evaluate neuroma -F/u after MRI   Return in about 3 weeks (around 07/03/2019) for MRI F/u.

## 2019-07-18 ENCOUNTER — Ambulatory Visit
Admission: RE | Admit: 2019-07-18 | Discharge: 2019-07-18 | Disposition: A | Discharge status: Home / Self Care | Payer: Managed Care, Other (non HMO) | Source: Ambulatory Visit | Attending: Podiatry | Admitting: Podiatry

## 2019-07-18 DIAGNOSIS — G588 Other specified mononeuropathies: Secondary | ICD-10-CM

## 2019-07-18 DIAGNOSIS — M7741 Metatarsalgia, right foot: Secondary | ICD-10-CM

## 2019-07-18 DIAGNOSIS — M7742 Metatarsalgia, left foot: Secondary | ICD-10-CM

## 2019-07-20 ENCOUNTER — Ambulatory Visit: Payer: Managed Care, Other (non HMO) | Attending: Internal Medicine

## 2019-07-20 DIAGNOSIS — Z23 Encounter for immunization: Secondary | ICD-10-CM

## 2019-07-20 NOTE — Progress Notes (Signed)
   Covid-19 Vaccination Clinic  Name:  DUBOIS EICHMANN    MRN: [id number] DOB: 19-Nov-1970  07/20/2019  Mr. Grabenstein was observed post Covid-19 immunization for 15 minutes without incident. He was provided with Vaccine Information Sheet and instruction to access the V-Safe system.   Mr. Grewell was instructed to call 911 with any severe reactions post vaccine: Marland Kitchen Difficulty breathing  . Swelling of face and throat  . A fast heartbeat  . A bad rash all over body  . Dizziness and weakness   Immunizations Administered    Name Date Dose VIS Date Route   Pfizer COVID-19 Vaccine 07/20/2019  1:53 PM 0.3 mL 03/27/2019 Intramuscular   Manufacturer: [employer], [employer]   Lot: [id number]   [place]: [id number]

## 2019-07-24 ENCOUNTER — Other Ambulatory Visit: Payer: Self-pay

## 2019-07-24 ENCOUNTER — Ambulatory Visit (INDEPENDENT_AMBULATORY_CARE_PROVIDER_SITE_OTHER): Payer: Managed Care, Other (non HMO) | Admitting: Podiatry

## 2019-07-24 DIAGNOSIS — M7742 Metatarsalgia, left foot: Secondary | ICD-10-CM | POA: Diagnosis not present

## 2019-07-24 DIAGNOSIS — M2041 Other hammer toe(s) (acquired), right foot: Secondary | ICD-10-CM | POA: Diagnosis not present

## 2019-07-24 DIAGNOSIS — M659 Synovitis and tenosynovitis, unspecified: Secondary | ICD-10-CM | POA: Diagnosis not present

## 2019-07-24 DIAGNOSIS — M7741 Metatarsalgia, right foot: Secondary | ICD-10-CM

## 2019-07-24 DIAGNOSIS — M2042 Other hammer toe(s) (acquired), left foot: Secondary | ICD-10-CM

## 2019-07-30 IMAGING — CT CT ABD-PELV W/ CM
2 of 5 series · 14 of 46 positions shown, 16 images · IV contrast (APPLIED)
Comparison: CT abdomen pelvis-10/11/2017; 01/17/2017; 06/24/2016

CLINICAL DATA: Recent diagnosis of pancreatitis with persistent
abdominal pain.

EXAM:
CT ABDOMEN AND PELVIS WITH CONTRAST
TECHNIQUE: Multidetector CT imaging of the abdomen and pelvis was performed
using the standard protocol following bolus administration of
intravenous contrast.
CONTRAST:  100mL OMNIPAQUE IOHEXOL 300 MG/ML  SOLN

[Series 3: abdomen 5.0 · axial · 0.79mm/px · z∈[+766,+1222]mm · 11 of 105 slices shown, 13 images]
[im 7/105  soft-tissue]
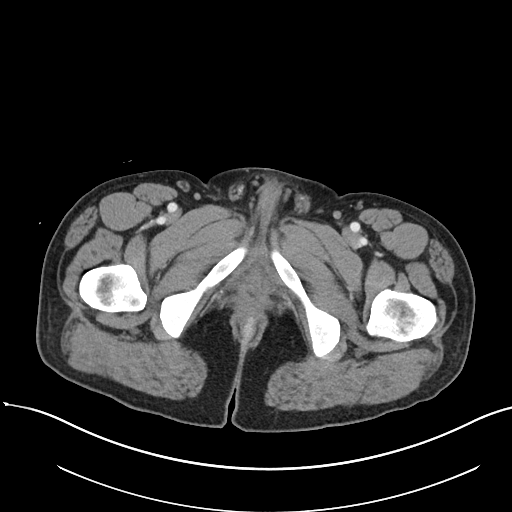
[im 7/105  bone]
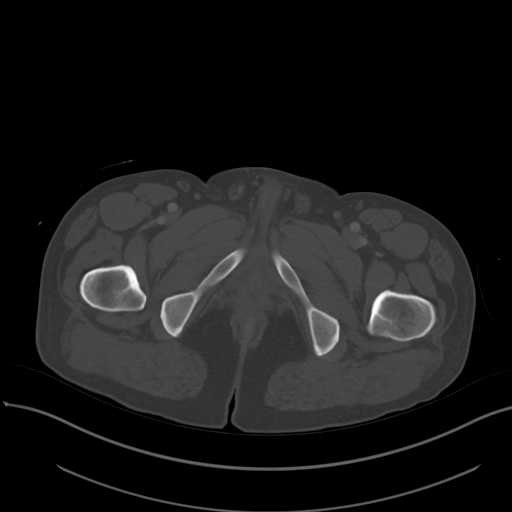
[im 14/105  soft-tissue]
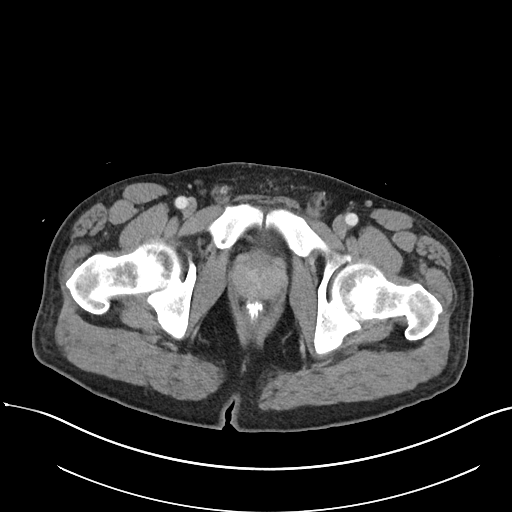
[im 28/105  soft-tissue]
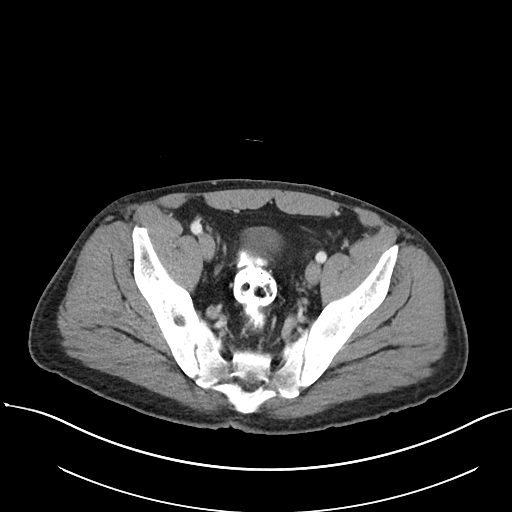
[im 35/105  soft-tissue]
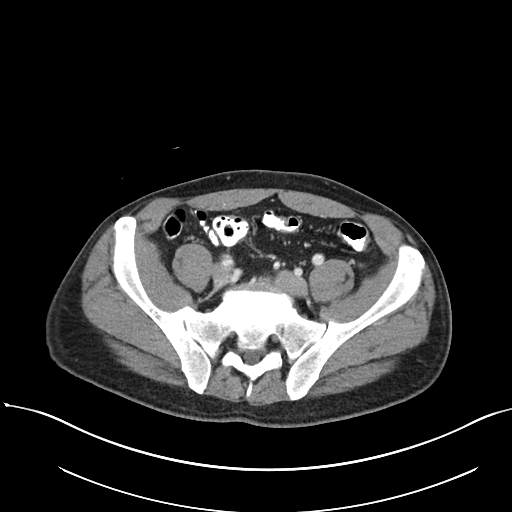
[im 42/105  soft-tissue]
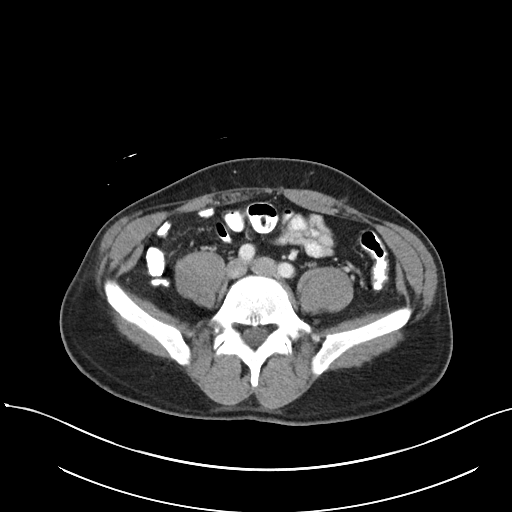
[im 56/105  soft-tissue]
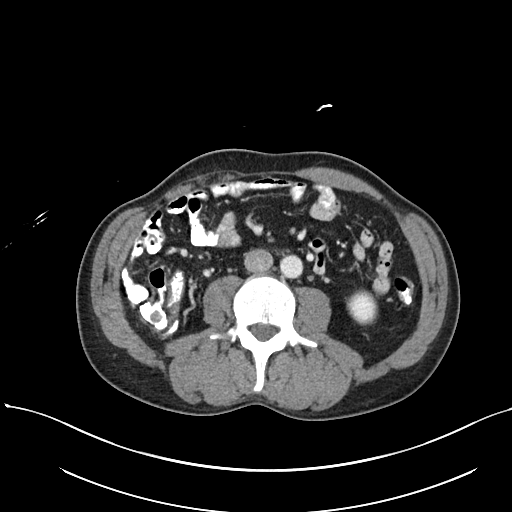
[im 63/105  soft-tissue]
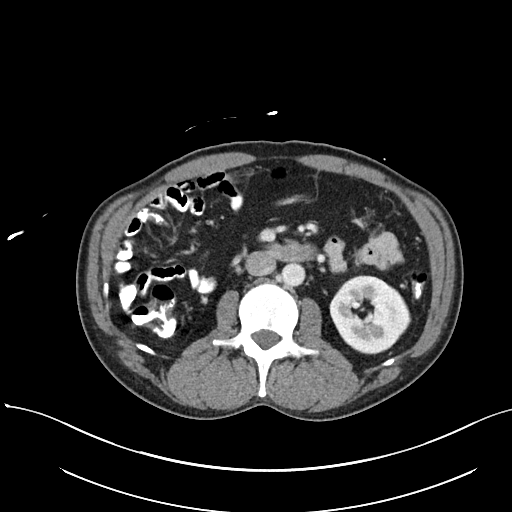
[im 70/105  soft-tissue]
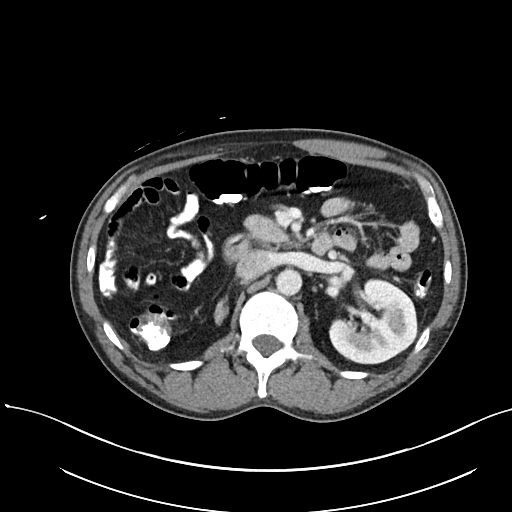
[im 77/105  soft-tissue]
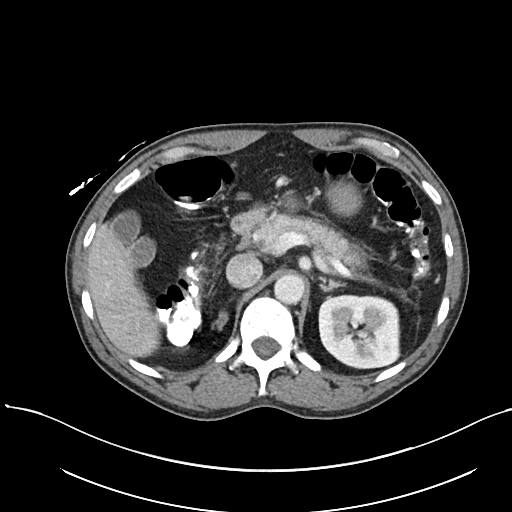
[im 77/105  bone]
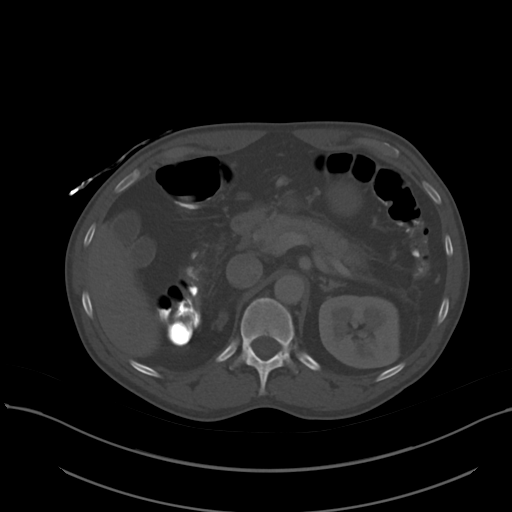
[im 91/105  soft-tissue]
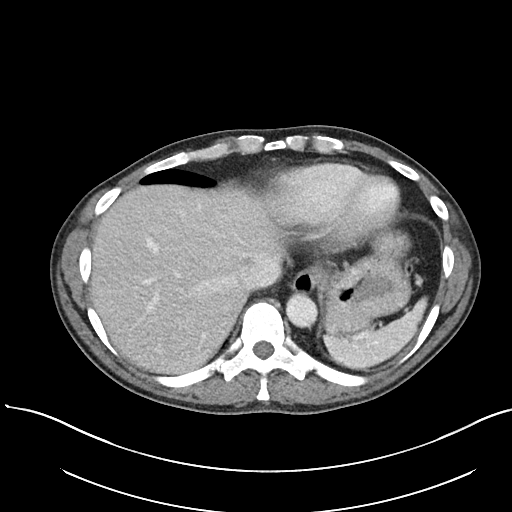
[im 98/105  soft-tissue]
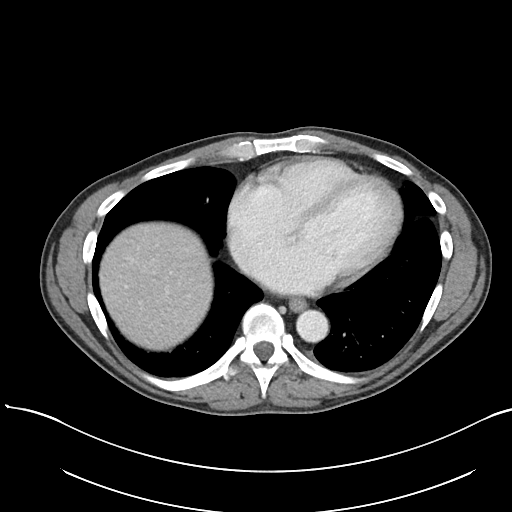

[Series 6: abdomen 3.0 mpr cor · coronal · 0.76mm/px · 3 of 74 slices shown]
[im 25/74  soft-tissue]
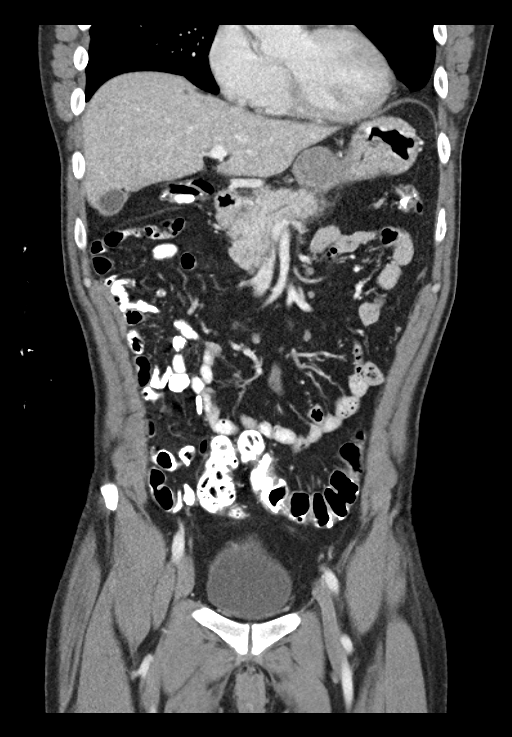
[im 33/74  soft-tissue]
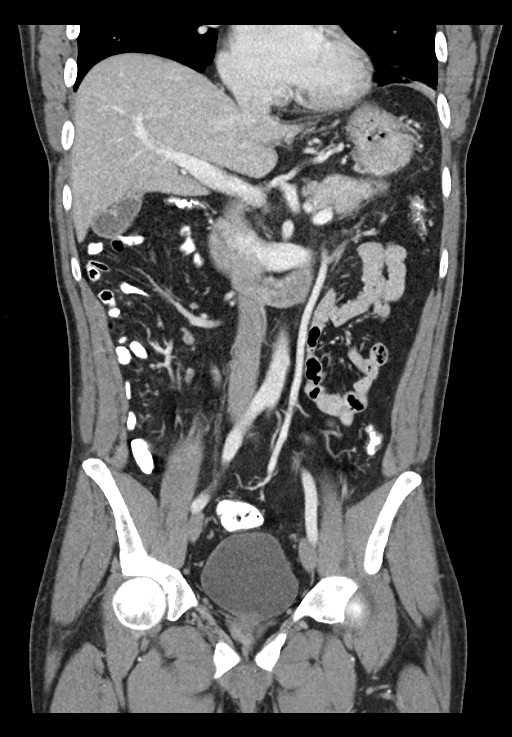
[im 41/74  soft-tissue]
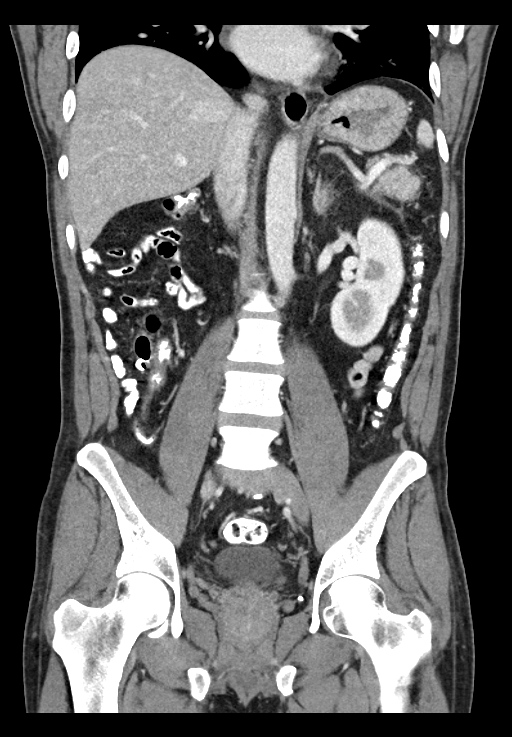

[14 of 46 positions shown; findings below may reference images not displayed]

FINDINGS: Lower chest: Limited visualization the lower thorax demonstrates
minimal dependent subpleural atelectasis within the imaged left
lower lobe. No focal airspace opacities. No pleural effusion.

Borderline cardiomegaly.  No pericardial effusion.

Hepatobiliary: Normal hepatic contour. Punctate subcentimeter
hypoattenuating lesions within the dome of the right lobe of the
liver (images 11 and 13, series 3) are too small to adequately
characterize though similar to the [DATE] examination and favored
to represent hepatic cysts. Normal appearance of the gallbladder
given degree distention. No radiopaque gallstones. No intra or
extrahepatic biliary ductal dilatation. No ascites.

Pancreas: Grossly unchanged minimal amount of peripancreatic
stranding (axial image 28, series 3; coronal image 37, series 6).
There is homogeneous enhancement of the pancreatic parenchyma
without evidence of pancreatic necrosis. No definitive pancreatic
mass or pancreatic ductal dilatation on this non pancreatic protocol
CT.

Spleen: Normal appearance of the spleen

Adrenals/Urinary Tract: Re-demonstrated congenital atrophy of the
right kidney. There is homogeneous enhancement of the left kidney.
Unchanged punctate adjacent nonobstructing stones within inferior
pole of the left kidney with dominant stone measuring approximately
8 mm (coronal image 46, series 6). No urinary obstruction or
perinephric stranding. Note is made of an approximately 1.3 cm
hypoattenuating nonenhancing cyst arising from the posterosuperior
aspect the right kidney. No discrete left-sided renal lesions.

Normal appearance of the bilateral adrenal glands.

Normal appearance of the urinary bladder given degree distention.

Stomach/Bowel: Ingested enteric contrast extends to the level the
rectum. No evidence of enteric obstruction normal appearance of the
terminal ileum and retrocecal appendix. No pneumoperitoneum,
pneumatosis or portal venous gas.

Vascular/Lymphatic: Normal caliber the abdominal aorta. The major
branch vessels of the abdominal aorta appear widely patent. The
splenic artery and SMA the both appear widely patent without
evidence of vessel irregularity.

No bulky retroperitoneal, mesenteric, pelvic or inguinal
lymphadenopathy.

Reproductive: Normal appearance of the pelvic organs. No free fluid
in the pelvic cul-de-sac.

Other: There is a minimal amount of subcutaneous edema about the
midline of the low back.

Musculoskeletal: No acute or aggressive osseous abnormalities.
Moderate DDD of L5-S1 with disc space height loss, endplate
irregularity and sclerosis.
IMPRESSION: 1. Grossly unchanged findings compatible with acute uncomplicated
pancreatitis. Specifically, no evidence pancreatic necrosis or
definable/drainable pancreatic pseudocyst.
2. Nonobstructing left-sided nephrolithiasis.
3. Presumably congenital atrophy of the right kidney.

## 2019-08-14 ENCOUNTER — Ambulatory Visit: Payer: Managed Care, Other (non HMO) | Admitting: Podiatry

## 2019-08-18 ENCOUNTER — Emergency Department (HOSPITAL_COMMUNITY): Payer: Managed Care, Other (non HMO)

## 2019-08-18 ENCOUNTER — Other Ambulatory Visit: Payer: Self-pay

## 2019-08-18 ENCOUNTER — Emergency Department (HOSPITAL_COMMUNITY)
Admission: EM | Admit: 2019-08-18 | Discharge: 2019-08-18 | Disposition: A | Discharge status: Home / Self Care | Payer: Managed Care, Other (non HMO) | Source: Home / Self Care | Attending: Emergency Medicine | Admitting: Emergency Medicine

## 2019-08-18 ENCOUNTER — Encounter (HOSPITAL_COMMUNITY): Payer: Self-pay

## 2019-08-18 DIAGNOSIS — R569 Unspecified convulsions: Secondary | ICD-10-CM

## 2019-08-18 DIAGNOSIS — Z79899 Other long term (current) drug therapy: Secondary | ICD-10-CM | POA: Insufficient documentation

## 2019-08-18 DIAGNOSIS — I10 Essential (primary) hypertension: Secondary | ICD-10-CM | POA: Insufficient documentation

## 2019-08-18 LAB — BASIC METABOLIC PANEL
Anion gap: 12 (ref 5–15)
BUN: 10 mg/dL (ref 6–20)
CO2: 22 mmol/L (ref 22–32)
Calcium: 8.5 mg/dL — ABNORMAL LOW (ref 8.9–10.3)
Chloride: 101 mmol/L (ref 98–111)
Creatinine, Ser: 1.17 mg/dL (ref 0.61–1.24)
GFR calc Af Amer: >60 mL/min (ref >60)
GFR calc non Af Amer: >60 mL/min (ref >60)
Glucose, Bld: 97 mg/dL (ref 70–99)
Potassium: 3.7 mmol/L (ref 3.5–5.1)
Sodium: 135 mmol/L (ref 135–145)

## 2019-08-18 LAB — CBC WITH DIFFERENTIAL/PLATELET
Abs Immature Granulocytes: 0.02 10*3/uL (ref 0.00–0.07)
Basophils Absolute: 0 10*3/uL (ref 0.0–0.1)
Basophils Relative: 0 %
Eosinophils Absolute: 0 10*3/uL (ref 0.0–0.5)
Eosinophils Relative: 0 %
HCT: 37 % — ABNORMAL LOW (ref 39.0–52.0)
Hemoglobin: 12.6 g/dL — ABNORMAL LOW (ref 13.0–17.0)
Immature Granulocytes: 0 %
Lymphocytes Relative: 37 %
Lymphs Abs: 2.5 10*3/uL (ref 0.7–4.0)
MCH: 33.8 pg (ref 26.0–34.0)
MCHC: 34.1 g/dL (ref 30.0–36.0)
MCV: 99.2 fL (ref 80.0–100.0)
Monocytes Absolute: 0.5 10*3/uL (ref 0.1–1.0)
Monocytes Relative: 7 %
Neutro Abs: 3.7 10*3/uL (ref 1.7–7.7)
Neutrophils Relative %: 56 %
Platelets: 181 10*3/uL (ref 150–400)
RBC: 3.73 MIL/uL — ABNORMAL LOW (ref 4.22–5.81)
RDW: 14.5 % (ref 11.5–15.5)
WBC: 6.8 10*3/uL (ref 4.0–10.5)
nRBC: 0 % (ref 0.0–0.2)

## 2019-08-18 NOTE — ED Provider Notes (Signed)
[facility] DEPT Provider Note   CSN: [id number] Arrival date & time: 08/18/19  2003     History Chief Complaint  Patient presents with  . Seizures    Kyle Berry is a 49 y.o. male.  HPI   48yM with possible seizure. Came home this afternoon and felt tired so laid down on the couch. This is the last thing he remembers. His family reported that they witnessed him shaking all over, "foaming at the mouth," and not responsive. Activity stopped but confused after. No incontinence or oral trauma. Pt doesn't remember this. The next think he recalls after laying down was being in the ambulance. He reports he has been otherwise feeling fine recently. No seizure hx. Says he only drinks rarely because hx of pancreatitis.   Past Medical History:  Diagnosis Date  . Acute pancreatitis 11/23/2015  . Chronic bronchitis ([place])   . Chronic lower back pain   . Congenital single kidney   . GERD (gastroesophageal reflux disease)   . Headache    "once/month" (05/13/2017)  . High cholesterol   . History of kidney stones   . Hypertension   . Migraine    "a couple/year" (05/13/2017)  . Nephrolithiasis 05/13/2017  . Pneumonia ~ 09/2015  . Recurrent acute pancreatitis   . Renal disorder     Patient Active Problem List   Diagnosis Date Noted  . Pancreatic insufficiency 05/28/2019  . Recurrent acute pancreatitis 10/17/2018  . Microscopic hematuria 10/17/2018  . Acute pancreatitis 07/23/2018  . Pancreatic divisum   . Recurrent pancreatitis 05/13/2017  . Congenital single kidney 05/13/2017  . High cholesterol 05/13/2017  . Nephrolithiasis 05/13/2017  . Acute on chronic pancreatitis ([place]) 01/17/2017  . Acute recurrent pancreatitis   . Hypokalemia   . Nausea and vomiting   . Essential hypertension   . Esophageal reflux   . Chronic pancreatitis ([place]) 11/23/2015    Past Surgical History:  Procedure Laterality Date  . CHOLECYSTECTOMY N/A 10/23/2017   Procedure: LAPAROSCOPIC CHOLECYSTECTOMY WITH INTRAOPERATIVE CHOLANGIOGRAM;  Surgeon: Johnathan Hausen, MD;  Location: [facility] ORS;  Service: General;  Laterality: N/A;  . NO PAST SURGERIES         Family History  Problem Relation Age of Onset  . Hypertension Other   . Hypertension Mother   . Hypertension Father   . Kidney disease Father   . Hypertension Sister   . Diabetes Sister   . Hypertension Brother     Social History   Tobacco Use  . Smoking status: Never Smoker  . Smokeless tobacco: Never Used  Substance Use Topics  . Alcohol use: Not Currently    Comment: 05/13/2017 "might have beer a few times/year"  . Drug use: No    Home Medications Prior to Admission medications   Medication Sig Start Date End Date Taking? Authorizing Provider  ibuprofen (ADVIL) 800 MG tablet Take 1 tablet (800 mg total) by mouth every 8 (eight) hours as needed. 03/12/19   Fransico Meadow, PA-C  lipase/protease/amylase (CREON) 36000 UNITS CPEP capsule Take 1 capsule (36,000 Units total) by mouth See admin instructions. Take one capsules (36,000 units) by mouth with meals (2 meals daily) and one capsule (36000 units) with snacks daily 10/14/17   Emokpae, [name], MD  ondansetron (ZOFRAN ODT) 4 MG disintegrating tablet Take 1 tablet (4 mg total) by mouth every 8 (eight) hours as needed for nausea or vomiting. 03/12/19   Fransico Meadow, PA-C  oxyCODONE-acetaminophen (PERCOCET/ROXICET) 5-325 MG tablet Take 1  tablet by mouth every 6 (six) hours as needed for moderate pain. 06/03/19   Mikhail, Velta Addison, DO  telmisartan (MICARDIS) 40 MG tablet Take 40 mg by mouth daily. 03/05/19   [provider]    Allergies    Diclofenac, Norvasc [amlodipine besylate], Omeprazole, and Prednisone  Review of Systems   Review of Systems All systems reviewed and negative, other than as noted in HPI.  Physical Exam Updated Vital Signs SpO2 96%   Physical Exam Vitals and nursing note reviewed.  Constitutional:       General: He is not in acute distress.    Appearance: He is well-developed.  HENT:     Head: Normocephalic and atraumatic.  Eyes:     General:        Right eye: No discharge.        Left eye: No discharge.     Conjunctiva/sclera: Conjunctivae normal.  Cardiovascular:     Rate and Rhythm: Normal rate and regular rhythm.     Heart sounds: Normal heart sounds. No murmur. No friction rub. No gallop.   Pulmonary:     Effort: Pulmonary effort is normal. No respiratory distress.     Breath sounds: Normal breath sounds.  Abdominal:     General: There is no distension.     Palpations: Abdomen is soft.     Tenderness: There is no abdominal tenderness.  Musculoskeletal:        General: No tenderness.     Cervical back: Neck supple.  Skin:    General: Skin is warm and dry.  Neurological:     General: No focal deficit present.     Mental Status: He is alert and oriented to person, place, and time.     Cranial Nerves: No cranial nerve deficit.     Sensory: No sensory deficit.     Motor: No weakness.     Coordination: Coordination normal.  Psychiatric:        Behavior: Behavior normal.        Thought Content: Thought content normal.     ED Results / Procedures / Treatments   Labs (all labs ordered are listed, but only abnormal results are displayed) Labs Reviewed  BASIC METABOLIC PANEL - Abnormal; Notable for the following components:      Result Value   Calcium 8.5 (*)    All other components within normal limits  CBC WITH DIFFERENTIAL/PLATELET - Abnormal; Notable for the following components:   RBC 3.73 (*)    Hemoglobin 12.6 (*)    HCT 37.0 (*)    All other components within normal limits    EKG EKG Interpretation  Date/Time:  Tuesday Aug 18 2019 20:14:58 EDT Ventricular Rate:  97 PR Interval:    QRS Duration: 83 QT Interval:  335 QTC Calculation: 426 R Axis:   25 Text Interpretation: Sinus rhythm early repolarization Confirmed by [name] [phone]) on 08/18/2019  8:39:47 PM   Radiology CT Head Wo Contrast  Result Date: 08/18/2019 CLINICAL DATA:  Seizure EXAM: CT HEAD WITHOUT CONTRAST TECHNIQUE: Contiguous axial images were obtained from the base of the skull through the vertex without intravenous contrast. COMPARISON:  12/29/2013 FINDINGS: Brain: No acute infarct or hemorrhage. Lateral ventricles and midline structures are unremarkable. No acute extra-axial fluid collections. No mass effect. Vascular: No hyperdense vessel or unexpected calcification. Skull: Normal. Negative for fracture or focal lesion. Sinuses/Orbits: No acute finding. Other: None. IMPRESSION: 1. No acute intracranial process. Electronically Signed   By: Randa Ngo  M.D.   On: 08/18/2019 20:54    Procedures Procedures (including critical care time)  Medications Ordered in ED Medications - No data to display  ED Course  I have reviewed the triage vital signs and the nursing notes.  Pertinent labs & imaging results that were available during my care of the patient were reviewed by me and considered in my medical decision making (see chart for details).    MDM Rules/Calculators/A&P                      48yM with possible seizure? Back to baseline. No complaints aside from feeling tired. Nontoxic. Afebrile. Nonfocal neuro exam. Labs and CT head fine. Will have him follow-up with neurology.   Final Clinical Impression(s) / ED Diagnoses Final diagnoses:  Seizure-like activity [facility])    Rx / [place] Orders ED Discharge Orders    None       [name], MD 08/19/19 1739

## 2019-08-18 NOTE — ED Triage Notes (Signed)
Per EMS, patient c/o nausea and a headache. Patient's family called ems for seizure like activity. Upon arrival to scene, patient was not able to tell EMS his name. Patient is now a&ox4. No hx of seizures.

## 2019-08-18 NOTE — ED Notes (Signed)
[place] signature pad not working in patient's room. Patient verbalized understanding of discharge.

## 2019-08-20 ENCOUNTER — Encounter (HOSPITAL_COMMUNITY): Payer: Self-pay | Admitting: Emergency Medicine

## 2019-08-20 ENCOUNTER — Other Ambulatory Visit: Payer: Self-pay

## 2019-08-20 ENCOUNTER — Emergency Department (HOSPITAL_COMMUNITY): Payer: Managed Care, Other (non HMO)

## 2019-08-20 ENCOUNTER — Inpatient Hospital Stay (HOSPITAL_COMMUNITY)
Admission: EM | Admit: 2019-08-20 | Discharge: 2019-08-28 | DRG: 439 | Disposition: A | Discharge status: Home / Self Care | Payer: Managed Care, Other (non HMO) | Attending: Internal Medicine | Admitting: Internal Medicine

## 2019-08-20 DIAGNOSIS — K861 Other chronic pancreatitis: Secondary | ICD-10-CM | POA: Diagnosis present

## 2019-08-20 DIAGNOSIS — Z833 Family history of diabetes mellitus: Secondary | ICD-10-CM

## 2019-08-20 DIAGNOSIS — Z87442 Personal history of urinary calculi: Secondary | ICD-10-CM

## 2019-08-20 DIAGNOSIS — Q453 Other congenital malformations of pancreas and pancreatic duct: Secondary | ICD-10-CM

## 2019-08-20 DIAGNOSIS — Z841 Family history of disorders of kidney and ureter: Secondary | ICD-10-CM

## 2019-08-20 DIAGNOSIS — Z20822 Contact with and (suspected) exposure to covid-19: Secondary | ICD-10-CM | POA: Diagnosis present

## 2019-08-20 DIAGNOSIS — E860 Dehydration: Secondary | ICD-10-CM | POA: Diagnosis present

## 2019-08-20 DIAGNOSIS — N179 Acute kidney failure, unspecified: Secondary | ICD-10-CM | POA: Diagnosis present

## 2019-08-20 DIAGNOSIS — I10 Essential (primary) hypertension: Secondary | ICD-10-CM | POA: Diagnosis present

## 2019-08-20 DIAGNOSIS — R569 Unspecified convulsions: Secondary | ICD-10-CM | POA: Diagnosis present

## 2019-08-20 DIAGNOSIS — Z888 Allergy status to other drugs, medicaments and biological substances status: Secondary | ICD-10-CM

## 2019-08-20 DIAGNOSIS — G8929 Other chronic pain: Secondary | ICD-10-CM | POA: Diagnosis present

## 2019-08-20 DIAGNOSIS — Q600 Renal agenesis, unilateral: Secondary | ICD-10-CM

## 2019-08-20 DIAGNOSIS — K8681 Exocrine pancreatic insufficiency: Secondary | ICD-10-CM | POA: Diagnosis present

## 2019-08-20 DIAGNOSIS — I160 Hypertensive urgency: Secondary | ICD-10-CM | POA: Diagnosis present

## 2019-08-20 DIAGNOSIS — Z9049 Acquired absence of other specified parts of digestive tract: Secondary | ICD-10-CM

## 2019-08-20 DIAGNOSIS — K859 Acute pancreatitis without necrosis or infection, unspecified: Secondary | ICD-10-CM | POA: Diagnosis present

## 2019-08-20 DIAGNOSIS — K8500 Idiopathic acute pancreatitis without necrosis or infection: Principal | ICD-10-CM | POA: Diagnosis present

## 2019-08-20 DIAGNOSIS — Z8249 Family history of ischemic heart disease and other diseases of the circulatory system: Secondary | ICD-10-CM

## 2019-08-20 DIAGNOSIS — E876 Hypokalemia: Secondary | ICD-10-CM | POA: Diagnosis present

## 2019-08-20 DIAGNOSIS — Z79899 Other long term (current) drug therapy: Secondary | ICD-10-CM

## 2019-08-20 LAB — COMPREHENSIVE METABOLIC PANEL
ALT: 57 U/L — ABNORMAL HIGH (ref 0–44)
AST: 89 U/L — ABNORMAL HIGH (ref 15–41)
Albumin: 3.7 g/dL (ref 3.5–5.0)
Alkaline Phosphatase: 76 U/L (ref 38–126)
Anion gap: 12 (ref 5–15)
BUN: 5 mg/dL — ABNORMAL LOW (ref 6–20)
CO2: 23 mmol/L (ref 22–32)
Calcium: 9.1 mg/dL (ref 8.9–10.3)
Chloride: 101 mmol/L (ref 98–111)
Creatinine, Ser: 1.3 mg/dL — ABNORMAL HIGH (ref 0.61–1.24)
GFR calc Af Amer: >60 mL/min (ref >60)
GFR calc non Af Amer: >60 mL/min (ref >60)
Glucose, Bld: 133 mg/dL — ABNORMAL HIGH (ref 70–99)
Potassium: 3.4 mmol/L — ABNORMAL LOW (ref 3.5–5.1)
Sodium: 136 mmol/L (ref 135–145)
Total Bilirubin: 1.3 mg/dL — ABNORMAL HIGH (ref 0.3–1.2)
Total Protein: 7.1 g/dL (ref 6.5–8.1)

## 2019-08-20 LAB — CBC
HCT: 40.6 % (ref 39.0–52.0)
Hemoglobin: 13.2 g/dL (ref 13.0–17.0)
MCH: 32.7 pg (ref 26.0–34.0)
MCHC: 32.5 g/dL (ref 30.0–36.0)
MCV: 100.5 fL — ABNORMAL HIGH (ref 80.0–100.0)
Platelets: 182 10*3/uL (ref 150–400)
RBC: 4.04 MIL/uL — ABNORMAL LOW (ref 4.22–5.81)
RDW: 14.5 % (ref 11.5–15.5)
WBC: 8.8 10*3/uL (ref 4.0–10.5)
nRBC: 0.2 % (ref 0.0–0.2)

## 2019-08-20 LAB — LIPASE, BLOOD: Lipase: 339 U/L — ABNORMAL HIGH (ref 11–51)

## 2019-08-20 MED ORDER — SODIUM CHLORIDE 0.9% FLUSH
3.0000 mL | Freq: Once | INTRAVENOUS | Status: AC
  Administered 2019-08-20: 3 mL via INTRAVENOUS

## 2019-08-20 MED ORDER — IOHEXOL 300 MG/ML  SOLN
100.0000 mL | Freq: Once | INTRAMUSCULAR | Status: AC | PRN
  Administered 2019-08-20: 100 mL via INTRAVENOUS

## 2019-08-20 MED ORDER — ALUM & MAG HYDROXIDE-SIMETH 200-200-20 MG/5ML PO SUSP
30.0000 mL | Freq: Once | ORAL | Status: AC
  Administered 2019-08-20: 30 mL via ORAL
  Filled 2019-08-20: qty 30

## 2019-08-20 MED ORDER — DROPERIDOL 2.5 MG/ML IJ SOLN
1.2500 mg | Freq: Once | INTRAMUSCULAR | Status: AC
  Administered 2019-08-20: 1.25 mg via INTRAVENOUS
  Filled 2019-08-20: qty 2

## 2019-08-20 MED ORDER — HYDROMORPHONE HCL 1 MG/ML IJ SOLN
1.0000 mg | Freq: Once | INTRAMUSCULAR | Status: AC
  Administered 2019-08-20: 1 mg via INTRAVENOUS
  Filled 2019-08-20: qty 1

## 2019-08-20 MED ORDER — HYDROMORPHONE HCL 1 MG/ML IJ SOLN
1.0000 mg | Freq: Once | INTRAMUSCULAR | Status: DC
  Filled 2019-08-20: qty 1

## 2019-08-20 MED ORDER — DIPHENHYDRAMINE HCL 50 MG/ML IJ SOLN
25.0000 mg | Freq: Once | INTRAMUSCULAR | Status: AC
  Administered 2019-08-20: 25 mg via INTRAVENOUS
  Filled 2019-08-20: qty 1

## 2019-08-20 MED ORDER — SODIUM CHLORIDE 0.9 % IV BOLUS
1000.0000 mL | Freq: Once | INTRAVENOUS | Status: AC
  Administered 2019-08-20: 1000 mL via INTRAVENOUS

## 2019-08-20 NOTE — ED Triage Notes (Signed)
C/o abdominal pain, nausea, vomiting that started Tuesday. Hx pancreatitis.

## 2019-08-20 NOTE — ED Notes (Signed)
EKG obtained due to possible ST elevation. NT Conner obtained same and had MD examine it.

## 2019-08-20 NOTE — ED Notes (Signed)
Back to room.

## 2019-08-20 NOTE — ED Notes (Signed)
Pt reports pain 10 out of 10 due to pancreatitis. Pt stated he has no eaten or drank anything today. Pt b owel sounds hypoactive. Lungs clear, S1, S2 WDL

## 2019-08-20 NOTE — ED Notes (Signed)
Out to imaging

## 2019-08-20 NOTE — ED Provider Notes (Signed)
[facility] DEPARTMENT Provider Note   CSN: [id number] Arrival date & time: 08/20/19  1459     History Chief Complaint  Patient presents with  . Abdominal Pain    Kyle Berry is a 49 y.o. male.  49 yo M with a chief complaints of epigastric abdominal pain that radiates to the back.  This been going on for couple days now.  Feels like normally when he has pancreatitis.  Is describing subjective fevers at home.  Nausea and vomiting.  He has not been able to eat and drink anything for 48 hours.  The history is provided by the patient.  Abdominal Pain Pain location:  Epigastric Pain quality: sharp and shooting   Pain radiates to:  Back Pain severity:  Severe Onset quality:  Gradual Duration:  2 days Timing:  Constant Progression:  Worsening Chronicity:  Recurrent Relieved by:  Nothing Worsened by:  Nothing Ineffective treatments:  None tried Associated symptoms: nausea and vomiting   Associated symptoms: no chest pain, no chills, no diarrhea, no fever and no shortness of breath        Past Medical History:  Diagnosis Date  . Acute pancreatitis 11/23/2015  . Chronic bronchitis ([place])   . Chronic lower back pain   . Congenital single kidney   . GERD (gastroesophageal reflux disease)   . Headache    "once/month" (05/13/2017)  . High cholesterol   . History of kidney stones   . Hypertension   . Migraine    "a couple/year" (05/13/2017)  . Nephrolithiasis 05/13/2017  . Pneumonia ~ 09/2015  . Recurrent acute pancreatitis   . Renal disorder     Patient Active Problem List   Diagnosis Date Noted  . Pancreatic insufficiency 05/28/2019  . Recurrent acute pancreatitis 10/17/2018  . Microscopic hematuria 10/17/2018  . Acute pancreatitis 07/23/2018  . Pancreatic divisum   . Recurrent pancreatitis 05/13/2017  . Congenital single kidney 05/13/2017  . High cholesterol 05/13/2017  . Nephrolithiasis 05/13/2017  . Acute on chronic pancreatitis  ([place]) 01/17/2017  . Acute recurrent pancreatitis   . Hypokalemia   . Nausea and vomiting   . Essential hypertension   . Esophageal reflux   . Chronic pancreatitis ([place]) 11/23/2015    Past Surgical History:  Procedure Laterality Date  . CHOLECYSTECTOMY N/A 10/23/2017   Procedure: LAPAROSCOPIC CHOLECYSTECTOMY WITH INTRAOPERATIVE CHOLANGIOGRAM;  Surgeon: Johnathan Hausen, MD;  Location: [facility];  Service: General;  Laterality: N/A;  . NO PAST SURGERIES         Family History  Problem Relation Age of Onset  . Hypertension Other   . Hypertension Mother   . Hypertension Father   . Kidney disease Father   . Hypertension Sister   . Diabetes Sister   . Hypertension Brother     Social History   Tobacco Use  . Smoking status: Never Smoker  . Smokeless tobacco: Never Used  Substance Use Topics  . Alcohol use: Not Currently    Comment: 05/13/2017 "might have beer a few times/year"  . Drug use: No    Home Medications Prior to Admission medications   Medication Sig Start Date End Date Taking? Authorizing Provider  acetaminophen (TYLENOL) 325 MG tablet Take 1-2 tablets by mouth 3 (three) times daily as needed for moderate pain or headache. 08/19/19  Yes [provider]  diphenhydrAMINE (BENADRYL) 25 MG tablet Take 25 mg by mouth daily as needed for itching or allergies.   Yes [provider]  ibuprofen (  ADVIL) 800 MG tablet Take 1 tablet (800 mg total) by mouth every 8 (eight) hours as needed. 03/12/19  Yes Fransico Meadow, PA-C  lipase/protease/amylase (CREON) 36000 UNITS CPEP capsule Take 1 capsule (36,000 Units total) by mouth See admin instructions. Take one capsules (36,000 units) by mouth with meals (2 meals daily) and one capsule (36000 units) with snacks daily Patient taking differently: Take 36,000 Units by mouth See admin instructions. Take 1 capsule by mouth twice daily with meals and 1 capsule with snacks daily 10/14/17  Yes Emokpae, [name], MD  ondansetron  (ZOFRAN ODT) 4 MG disintegrating tablet Take 1 tablet (4 mg total) by mouth every 8 (eight) hours as needed for nausea or vomiting. 03/12/19  Yes Fransico Meadow, PA-C  oxyCODONE-acetaminophen (PERCOCET/ROXICET) 5-325 MG tablet Take 1 tablet by mouth every 6 (six) hours as needed for moderate pain. 06/03/19  Yes Mikhail, Maryann, DO  telmisartan (MICARDIS) 40 MG tablet Take 40 mg by mouth daily. 03/05/19  Yes [provider]  tetrahydrozoline 0.05 % ophthalmic solution Place 1 drop into both eyes daily as needed (Dry eyes).   Yes [provider]    Allergies    Diclofenac, Norvasc [amlodipine besylate], Omeprazole, and Prednisone  Review of Systems   Review of Systems  Constitutional: Negative for chills and fever.  HENT: Negative for congestion and facial swelling.   Eyes: Negative for discharge and visual disturbance.  Respiratory: Negative for shortness of breath.   Cardiovascular: Negative for chest pain and palpitations.  Gastrointestinal: Positive for abdominal pain, nausea and vomiting. Negative for diarrhea.  Musculoskeletal: Negative for arthralgias and myalgias.  Skin: Negative for color change and rash.  Neurological: Negative for tremors, syncope and headaches.  Psychiatric/Behavioral: Negative for confusion and dysphoric mood.    Physical Exam Updated Vital Signs BP (!) 142/92 (BP Location: Left Arm)   Pulse (!) 109   Temp 98.6 F (37 C) (Oral)   Resp 17   Ht 6\' 1"  (1.854 m)   Wt 79.3 kg   SpO2 97%   BMI 23.07 kg/m   Physical Exam Vitals and nursing note reviewed.  Constitutional:      Appearance: He is well-developed.  HENT:     Head: Normocephalic and atraumatic.  Eyes:     Pupils: Pupils are equal, round, and reactive to light.  Neck:     Vascular: No JVD.  Cardiovascular:     Rate and Rhythm: Normal rate and regular rhythm.     Heart sounds: No murmur. No friction rub. No gallop.   Pulmonary:     Effort: No respiratory distress.      Breath sounds: No wheezing.  Abdominal:     General: There is no distension.     Tenderness: There is abdominal tenderness. There is no guarding or rebound.     Comments: Diffuse tenderness but worse to the epigastrium  Musculoskeletal:        General: Normal range of motion.     Cervical back: Normal range of motion and neck supple.  Skin:    Coloration: Skin is not pale.     Findings: No rash.  Neurological:     Mental Status: He is alert and oriented to person, place, and time.  Psychiatric:        Behavior: Behavior normal.     ED Results / Procedures / Treatments   Labs (all labs ordered are listed, but only abnormal results are displayed) Labs Reviewed  LIPASE, BLOOD - Abnormal; Notable  for the following components:      Result Value   Lipase 339 (*)    All other components within normal limits  COMPREHENSIVE METABOLIC PANEL - Abnormal; Notable for the following components:   Potassium 3.4 (*)    Glucose, Bld 133 (*)    BUN 5 (*)    Creatinine, Ser 1.30 (*)    AST 89 (*)    ALT 57 (*)    Total Bilirubin 1.3 (*)    All other components within normal limits  CBC - Abnormal; Notable for the following components:   RBC 4.04 (*)    MCV 100.5 (*)    All other components within normal limits  BASIC METABOLIC PANEL - Abnormal; Notable for the following components:   CO2 21 (*)    Glucose, Bld 113 (*)    BUN <5 (*)    Calcium 8.2 (*)    All other components within normal limits  HEPATIC FUNCTION PANEL - Abnormal; Notable for the following components:   Albumin 3.1 (*)    AST 62 (*)    ALT 45 (*)    Total Bilirubin 1.3 (*)    Indirect Bilirubin 1.1 (*)    All other components within normal limits  CBC WITH DIFFERENTIAL/PLATELET - Abnormal; Notable for the following components:   RBC 3.70 (*)    Hemoglobin 12.2 (*)    HCT 37.3 (*)    MCV 100.8 (*)    Neutro Abs 8.2 (*)    All other components within normal limits  RESPIRATORY PANEL BY RT PCR (FLU A&B, COVID)   TRIGLYCERIDES  URINALYSIS, ROUTINE W REFLEX MICROSCOPIC  CBC  TROPONIN I (HIGH SENSITIVITY)    EKG EKG Interpretation  Date/Time:  Thursday Aug 20 2019 20:26:33 EDT Ventricular Rate:  86 PR Interval:    QRS Duration: 78 QT Interval:  341 QTC Calculation: 408 R Axis:   45 Text Interpretation: Sinus rhythm RSR' in V1 or V2, right VCD or RVH Left ventricular hypertrophy Benign early repolarization No significant change since last tracing Confirmed by Deno Etienne [phone]) on 08/20/2019 8:30:53 PM Also confirmed by Deno Etienne [phone]), editor Gean Quint [phone])  on 08/21/2019 7:31:56 AM   Radiology CT ABDOMEN PELVIS W CONTRAST  Result Date: 08/20/2019 CLINICAL DATA:  49 year old male with abdominal pain. Concern for acute pancreatitis. EXAM: CT ABDOMEN AND PELVIS WITH CONTRAST TECHNIQUE: Multidetector CT imaging of the abdomen and pelvis was performed using the standard protocol following bolus administration of intravenous contrast. CONTRAST:  121mL OMNIPAQUE IOHEXOL 300 MG/ML  SOLN COMPARISON:  CT abdomen pelvis dated 02/04/2019. FINDINGS: Lower chest: The visualized lung bases are clear. No intra-abdominal free air or free fluid. Hepatobiliary: Diffuse fatty infiltration of the liver. No intrahepatic biliary ductal dilatation. Cholecystectomy. Pancreas: There is diffuse inflammatory changes of the pancreas consistent with acute pancreatitis. There is a 4 mm calcific focus in the region of the uncinate process of the pancreas, likely sequela of prior pancreatitis. There is no drainable fluid collection/abscess or pseudocyst. Spleen: Normal in size without focal abnormality. Adrenals/Urinary Tract: The adrenal glands are unremarkable. The right kidney is atrophic. Small right renal cystic lesions are too small to characterize but appears similar to prior CT. Two adjacent nonobstructing stones noted in the inferior pole of the left kidney each measuring approximately 6 mm. No hydronephrosis. The left  ureter and urinary bladder appear unremarkable. Stomach/Bowel: Several small scattered sigmoid diverticula without active inflammatory changes. There is no bowel obstruction or active inflammation. The appendix  is normal. Vascular/Lymphatic: Mild aortoiliac atherosclerotic disease. The IVC is unremarkable. No portal venous gas. There is no adenopathy. Reproductive: The prostate and seminal vesicles are grossly unremarkable. Other: None Musculoskeletal: Degenerative changes at L5-S1 with disc desiccation and vacuum phenomena. No acute osseous pathology. IMPRESSION: 1. Acute pancreatitis. No abscess or pseudocyst. 2. Fatty liver. 3. No bowel obstruction. Normal appendix. 4. Aortic Atherosclerosis (ICD10-I70.0). Electronically Signed   By: Anner Crete M.D.   On: 08/20/2019 21:21    Procedures Procedures (including critical care time)  Medications Ordered in ED Medications  ondansetron (ZOFRAN) tablet 4 mg (has no administration in time range)    Or  ondansetron (ZOFRAN) injection 4 mg (has no administration in time range)  heparin injection 5,000 Units (5,000 Units Subcutaneous Given 08/21/19 1351)  lactated ringers infusion ( Intravenous New Bag/Given 08/21/19 0621)  HYDROmorphone (DILAUDID) injection 1 mg (1 mg Intravenous Given 08/21/19 1350)  hydrALAZINE (APRESOLINE) injection 10 mg (10 mg Intravenous Given 08/21/19 0617)  labetalol (NORMODYNE) injection 5 mg (has no administration in time range)  prochlorperazine (COMPAZINE) injection 10 mg (10 mg Intravenous Given 08/21/19 1200)  sodium chloride flush (NS) 0.9 % injection 3 mL (3 mLs Intravenous Given 08/20/19 2048)  droperidol (INAPSINE) 2.5 MG/ML injection 1.25 mg (1.25 mg Intravenous Given 08/20/19 2036)  HYDROmorphone (DILAUDID) injection 1 mg (1 mg Intravenous Given 08/20/19 2029)  diphenhydrAMINE (BENADRYL) injection 25 mg (25 mg Intravenous Given 08/20/19 2032)  sodium chloride 0.9 % bolus 1,000 mL (0 mLs Intravenous Stopped 08/21/19 0326)  iohexol  (OMNIPAQUE) 300 MG/ML solution 100 mL (100 mLs Intravenous Contrast Given 08/20/19 2055)  alum & mag hydroxide-simeth (MAALOX/MYLANTA) 200-200-20 MG/5ML suspension 30 mL (30 mLs Oral Given 08/20/19 2316)  HYDROmorphone (DILAUDID) injection 1 mg (1 mg Intravenous Given 08/20/19 2325)  oxyCODONE (Oxy IR/ROXICODONE) immediate release tablet 10 mg (10 mg Oral Given 08/21/19 0221)  ondansetron (ZOFRAN) injection 4 mg (4 mg Intravenous Given 08/21/19 0156)  HYDROmorphone (DILAUDID) injection 1 mg (1 mg Intravenous Given 08/21/19 0443)  sodium chloride 0.9 % bolus 1,000 mL (0 mLs Intravenous Stopped 08/21/19 0609)    Followed by  sodium chloride 0.9 % bolus 1,000 mL (0 mLs Intravenous Stopped 08/21/19 0618)    ED Course  I have reviewed the triage vital signs and the nursing notes.  Pertinent labs & imaging results that were available during my care of the patient were reviewed by me and considered in my medical decision making (see chart for details).    MDM Rules/Calculators/A&P                      49 yo M with a chief complaints of epigastric abdominal pain that radiates to the back.  Has a history of pancreatitis thinks is feels the same.  Lipase is 300.  Mild LFT elevation.  He is describing fevers and severe pain.  Will obtain a CT scan.  Time to treat pain and nausea.  Reassess.  Patient had some mild improvement with pain and nausea medicine.  Felt that the pain was starting to come back.  Given a repeat dose of an oral trial.  Signed out to Dr. Leonette Monarch.  If patient fails likely will require admission.  The patients results and plan were reviewed and discussed.   Any x-rays performed were independently reviewed by myself.   Differential diagnosis were considered with the presenting HPI.  Medications  ondansetron (ZOFRAN) tablet 4 mg (has no administration in time range)    Or  ondansetron [facility]) injection 4 mg (has no administration in time range)  heparin injection 5,000 Units (5,000 Units  Subcutaneous Given 08/21/19 1351)  lactated ringers infusion ( Intravenous New Bag/Given 08/21/19 0621)  HYDROmorphone (DILAUDID) injection 1 mg (1 mg Intravenous Given 08/21/19 1350)  hydrALAZINE (APRESOLINE) injection 10 mg (10 mg Intravenous Given 08/21/19 0617)  labetalol (NORMODYNE) injection 5 mg (has no administration in time range)  prochlorperazine (COMPAZINE) injection 10 mg (10 mg Intravenous Given 08/21/19 1200)  sodium chloride flush (NS) 0.9 % injection 3 mL (3 mLs Intravenous Given 08/20/19 2048)  droperidol (INAPSINE) 2.5 MG/ML injection 1.25 mg (1.25 mg Intravenous Given 08/20/19 2036)  HYDROmorphone (DILAUDID) injection 1 mg (1 mg Intravenous Given 08/20/19 2029)  diphenhydrAMINE (BENADRYL) injection 25 mg (25 mg Intravenous Given 08/20/19 2032)  sodium chloride 0.9 % bolus 1,000 mL (0 mLs Intravenous Stopped 08/21/19 0326)  iohexol (OMNIPAQUE) 300 MG/ML solution 100 mL (100 mLs Intravenous Contrast Given 08/20/19 2055)  alum & mag hydroxide-simeth (MAALOX/MYLANTA) 200-200-20 MG/5ML suspension 30 mL (30 mLs Oral Given 08/20/19 2316)  HYDROmorphone (DILAUDID) injection 1 mg (1 mg Intravenous Given 08/20/19 2325)  oxyCODONE (Oxy IR/ROXICODONE) immediate release tablet 10 mg (10 mg Oral Given 08/21/19 0221)  ondansetron (ZOFRAN) injection 4 mg (4 mg Intravenous Given 08/21/19 0156)  HYDROmorphone (DILAUDID) injection 1 mg (1 mg Intravenous Given 08/21/19 0443)  sodium chloride 0.9 % bolus 1,000 mL (0 mLs Intravenous Stopped 08/21/19 0609)    Followed by  sodium chloride 0.9 % bolus 1,000 mL (0 mLs Intravenous Stopped 08/21/19 0618)    Vitals:   08/21/19 0648 08/21/19 0719 08/21/19 0840 08/21/19 1033  BP:  (!) 143/88 (!) 151/96 (!) 142/92  Pulse:  (!) 127 (!) 109 (!) 109  Resp:  16 17 17   Temp:  98.4 F (36.9 C) 98.8 F (37.1 C) 98.6 F (37 C)  TempSrc:  Oral Oral Oral  SpO2:  97% 98% 97%  Weight: 79.3 kg     Height: 6\' 1"  (1.854 m)       Final diagnoses:  Acute pancreatitis, unspecified complication  status, unspecified pancreatitis type     Final Clinical Impression(s) / ED Diagnoses Final diagnoses:  Acute pancreatitis, unspecified complication status, unspecified pancreatitis type    Rx / DC Orders ED Discharge Orders    None       Deno Etienne, DO 08/21/19 1509

## 2019-08-20 NOTE — ED Notes (Signed)
No answer for call to room x1.

## 2019-08-21 ENCOUNTER — Encounter (HOSPITAL_COMMUNITY): Payer: Self-pay | Admitting: Internal Medicine

## 2019-08-21 DIAGNOSIS — Z20822 Contact with and (suspected) exposure to covid-19: Secondary | ICD-10-CM | POA: Diagnosis present

## 2019-08-21 DIAGNOSIS — Q600 Renal agenesis, unilateral: Secondary | ICD-10-CM | POA: Diagnosis not present

## 2019-08-21 DIAGNOSIS — K8681 Exocrine pancreatic insufficiency: Secondary | ICD-10-CM | POA: Diagnosis present

## 2019-08-21 DIAGNOSIS — R1013 Epigastric pain: Secondary | ICD-10-CM | POA: Diagnosis not present

## 2019-08-21 DIAGNOSIS — K8500 Idiopathic acute pancreatitis without necrosis or infection: Principal | ICD-10-CM

## 2019-08-21 DIAGNOSIS — E876 Hypokalemia: Secondary | ICD-10-CM | POA: Diagnosis present

## 2019-08-21 DIAGNOSIS — Z841 Family history of disorders of kidney and ureter: Secondary | ICD-10-CM | POA: Diagnosis not present

## 2019-08-21 DIAGNOSIS — R569 Unspecified convulsions: Secondary | ICD-10-CM | POA: Diagnosis present

## 2019-08-21 DIAGNOSIS — K861 Other chronic pancreatitis: Secondary | ICD-10-CM | POA: Diagnosis present

## 2019-08-21 DIAGNOSIS — Z87442 Personal history of urinary calculi: Secondary | ICD-10-CM | POA: Diagnosis not present

## 2019-08-21 DIAGNOSIS — Z888 Allergy status to other drugs, medicaments and biological substances status: Secondary | ICD-10-CM | POA: Diagnosis not present

## 2019-08-21 DIAGNOSIS — Z833 Family history of diabetes mellitus: Secondary | ICD-10-CM | POA: Diagnosis not present

## 2019-08-21 DIAGNOSIS — I10 Essential (primary) hypertension: Secondary | ICD-10-CM

## 2019-08-21 DIAGNOSIS — I160 Hypertensive urgency: Secondary | ICD-10-CM | POA: Diagnosis present

## 2019-08-21 DIAGNOSIS — E860 Dehydration: Secondary | ICD-10-CM | POA: Diagnosis present

## 2019-08-21 DIAGNOSIS — Q453 Other congenital malformations of pancreas and pancreatic duct: Secondary | ICD-10-CM | POA: Diagnosis not present

## 2019-08-21 DIAGNOSIS — K859 Acute pancreatitis without necrosis or infection, unspecified: Secondary | ICD-10-CM

## 2019-08-21 DIAGNOSIS — N179 Acute kidney failure, unspecified: Secondary | ICD-10-CM | POA: Diagnosis present

## 2019-08-21 DIAGNOSIS — Z8249 Family history of ischemic heart disease and other diseases of the circulatory system: Secondary | ICD-10-CM | POA: Diagnosis not present

## 2019-08-21 DIAGNOSIS — G8929 Other chronic pain: Secondary | ICD-10-CM | POA: Diagnosis present

## 2019-08-21 DIAGNOSIS — Z9049 Acquired absence of other specified parts of digestive tract: Secondary | ICD-10-CM | POA: Diagnosis not present

## 2019-08-21 DIAGNOSIS — Z79899 Other long term (current) drug therapy: Secondary | ICD-10-CM | POA: Diagnosis not present

## 2019-08-21 LAB — BASIC METABOLIC PANEL
Anion gap: 13 (ref 5–15)
BUN: <5 mg/dL — ABNORMAL LOW (ref 6–20)
CO2: 21 mmol/L — ABNORMAL LOW (ref 22–32)
Calcium: 8.2 mg/dL — ABNORMAL LOW (ref 8.9–10.3)
Chloride: 104 mmol/L (ref 98–111)
Creatinine, Ser: 1.06 mg/dL (ref 0.61–1.24)
GFR calc Af Amer: >60 mL/min (ref >60)
GFR calc non Af Amer: >60 mL/min (ref >60)
Glucose, Bld: 113 mg/dL — ABNORMAL HIGH (ref 70–99)
Potassium: 4 mmol/L (ref 3.5–5.1)
Sodium: 138 mmol/L (ref 135–145)

## 2019-08-21 LAB — RESPIRATORY PANEL BY RT PCR (FLU A&B, COVID)
Influenza A by PCR: NEGATIVE
Influenza B by PCR: NEGATIVE
SARS Coronavirus 2 by RT PCR: NEGATIVE

## 2019-08-21 LAB — CBC WITH DIFFERENTIAL/PLATELET
Abs Immature Granulocytes: 0.04 10*3/uL (ref 0.00–0.07)
Basophils Absolute: 0 10*3/uL (ref 0.0–0.1)
Basophils Relative: 0 %
Eosinophils Absolute: 0 10*3/uL (ref 0.0–0.5)
Eosinophils Relative: 0 %
HCT: 37.3 % — ABNORMAL LOW (ref 39.0–52.0)
Hemoglobin: 12.2 g/dL — ABNORMAL LOW (ref 13.0–17.0)
Immature Granulocytes: 0 %
Lymphocytes Relative: 8 %
Lymphs Abs: 0.7 10*3/uL (ref 0.7–4.0)
MCH: 33 pg (ref 26.0–34.0)
MCHC: 32.7 g/dL (ref 30.0–36.0)
MCV: 100.8 fL — ABNORMAL HIGH (ref 80.0–100.0)
Monocytes Absolute: 0.4 10*3/uL (ref 0.1–1.0)
Monocytes Relative: 4 %
Neutro Abs: 8.2 10*3/uL — ABNORMAL HIGH (ref 1.7–7.7)
Neutrophils Relative %: 88 %
Platelets: 157 10*3/uL (ref 150–400)
RBC: 3.7 MIL/uL — ABNORMAL LOW (ref 4.22–5.81)
RDW: 14.4 % (ref 11.5–15.5)
WBC: 9.5 10*3/uL (ref 4.0–10.5)
nRBC: 0 % (ref 0.0–0.2)

## 2019-08-21 LAB — TROPONIN I (HIGH SENSITIVITY): Troponin I (High Sensitivity): 13 ng/L (ref <18)

## 2019-08-21 LAB — HEPATIC FUNCTION PANEL
ALT: 45 U/L — ABNORMAL HIGH (ref 0–44)
AST: 62 U/L — ABNORMAL HIGH (ref 15–41)
Albumin: 3.1 g/dL — ABNORMAL LOW (ref 3.5–5.0)
Alkaline Phosphatase: 71 U/L (ref 38–126)
Bilirubin, Direct: 0.2 mg/dL (ref 0.0–0.2)
Indirect Bilirubin: 1.1 mg/dL — ABNORMAL HIGH (ref 0.3–0.9)
Total Bilirubin: 1.3 mg/dL — ABNORMAL HIGH (ref 0.3–1.2)
Total Protein: 6.6 g/dL (ref 6.5–8.1)

## 2019-08-21 LAB — GLUCOSE, CAPILLARY: Glucose-Capillary: 85 mg/dL (ref 70–99)

## 2019-08-21 LAB — TRIGLYCERIDES: Triglycerides: 32 mg/dL (ref <150)

## 2019-08-21 MED ORDER — SODIUM CHLORIDE 0.9 % IV BOLUS (SEPSIS)
1000.0000 mL | Freq: Once | INTRAVENOUS | Status: AC
  Administered 2019-08-21: 1000 mL via INTRAVENOUS

## 2019-08-21 MED ORDER — ONDANSETRON HCL 4 MG/2ML IJ SOLN
4.0000 mg | Freq: Four times a day (QID) | INTRAMUSCULAR | Status: DC | PRN
  Administered 2019-08-22 – 2019-08-26 (×7): 4 mg via INTRAVENOUS
  Filled 2019-08-21 (×9): qty 2

## 2019-08-21 MED ORDER — SODIUM CHLORIDE 0.9 % IV SOLN: 1000.0000 mL | INTRAVENOUS | Status: DC

## 2019-08-21 MED ORDER — HEPARIN SODIUM (PORCINE) 5000 UNIT/ML IJ SOLN
5000.0000 [IU] | Freq: Three times a day (TID) | INTRAMUSCULAR | Status: DC
  Administered 2019-08-21 – 2019-08-28 (×21): 5000 [IU] via SUBCUTANEOUS
  Filled 2019-08-21 (×21): qty 1

## 2019-08-21 MED ORDER — OXYCODONE HCL 5 MG PO TABS
10.0000 mg | ORAL_TABLET | Freq: Once | ORAL | Status: AC
  Administered 2019-08-21: 10 mg via ORAL
  Filled 2019-08-21: qty 2

## 2019-08-21 MED ORDER — ONDANSETRON HCL 4 MG PO TABS: 4.0000 mg | ORAL_TABLET | Freq: Four times a day (QID) | ORAL | Status: DC | PRN

## 2019-08-21 MED ORDER — HYDRALAZINE HCL 20 MG/ML IJ SOLN
10.0000 mg | INTRAMUSCULAR | Status: DC | PRN
  Administered 2019-08-21: 10 mg via INTRAVENOUS
  Filled 2019-08-21: qty 1

## 2019-08-21 MED ORDER — LABETALOL HCL 5 MG/ML IV SOLN
5.0000 mg | INTRAVENOUS | Status: DC | PRN
  Filled 2019-08-21: qty 4

## 2019-08-21 MED ORDER — LACTATED RINGERS IV SOLN: INTRAVENOUS | Status: AC

## 2019-08-21 MED ORDER — HYDROMORPHONE HCL 1 MG/ML IJ SOLN
1.0000 mg | INTRAMUSCULAR | Status: DC | PRN
  Administered 2019-08-21 – 2019-08-27 (×35): 1 mg via INTRAVENOUS
  Filled 2019-08-21 (×36): qty 1

## 2019-08-21 MED ORDER — ONDANSETRON HCL 4 MG/2ML IJ SOLN
4.0000 mg | Freq: Once | INTRAMUSCULAR | Status: AC
  Administered 2019-08-21: 4 mg via INTRAVENOUS
  Filled 2019-08-21: qty 2

## 2019-08-21 MED ORDER — HYDROMORPHONE HCL 1 MG/ML IJ SOLN
1.0000 mg | Freq: Once | INTRAMUSCULAR | Status: AC
  Administered 2019-08-21: 1 mg via INTRAVENOUS
  Filled 2019-08-21: qty 1

## 2019-08-21 MED ORDER — PROCHLORPERAZINE EDISYLATE 10 MG/2ML IJ SOLN
10.0000 mg | INTRAMUSCULAR | Status: DC | PRN
  Administered 2019-08-21 – 2019-08-27 (×7): 10 mg via INTRAVENOUS
  Filled 2019-08-21 (×7): qty 2

## 2019-08-21 NOTE — Plan of Care (Signed)
New admission Problem: Education: Goal: Knowledge of General Education information will improve Description: Including pain rating scale, medication(s)/side effects and non-pharmacologic comfort measures Outcome: Not Applicable   Problem: Health Behavior/Discharge Planning: Goal: Ability to manage health-related needs will improve Outcome: Not Applicable   Problem: Clinical Measurements: Goal: Ability to maintain clinical measurements within normal limits will improve Outcome: Not Applicable Goal: Will remain free from infection Outcome: Not Applicable Goal: Diagnostic test results will improve Outcome: Not Applicable Goal: Respiratory complications will improve Outcome: Not Applicable Goal: Cardiovascular complication will be avoided Outcome: Not Applicable   Problem: Activity: Goal: Risk for activity intolerance will decrease Outcome: Not Applicable   Problem: Nutrition: Goal: Adequate nutrition will be maintained Outcome: Not Applicable   Problem: Coping: Goal: Level of anxiety will decrease Outcome: Not Applicable   Problem: Elimination: Goal: Will not experience complications related to bowel motility Outcome: Not Applicable Goal: Will not experience complications related to urinary retention Outcome: Not Applicable   Problem: Pain Managment: Goal: General experience of comfort will improve Outcome: Not Applicable   Problem: Safety: Goal: Ability to remain free from injury will improve Outcome: Not Applicable   Problem: Skin Integrity: Goal: Risk for impaired skin integrity will decrease Outcome: Not Applicable   

## 2019-08-21 NOTE — H&P (Signed)
History and Physical    FILLMORE BOIK [id number] DOB: 07/18/1970 DOA: 08/20/2019  PCP: [name] (Inactive)  Patient coming from: Home.  Chief Complaint: Abdominal pain nausea vomiting and diarrhea.  HPI: Kyle Berry is a 49 y.o. 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  Unable to keep anything pain worsening.  Denies any shortness of breath or chest pain had some subjective feeling of fever chills.  ED Course: In the ER patient was tachycardic with labs showing elevated lipase of 339 AST 89 ALT 57 CBC shows microcytic picture.  Total bilirubin 1.3.  CT abdomen pelvis shows features concerning for pancreatitis EKG shows normal sinus rhythm.  Patient started on IV fluids pain relief medication admitted for acute pancreatitis.  Cause of which is not clear.  Review of Systems: As per HPI, rest all negative.   Past Medical History:  Diagnosis Date  . Acute pancreatitis 11/23/2015  . Chronic bronchitis ([place])   . Chronic lower back pain   . Congenital single kidney   . GERD (gastroesophageal reflux disease)   . Headache    "once/month" (05/13/2017)  . High cholesterol   . History of kidney stones   . Hypertension   . Migraine    "a couple/year" (05/13/2017)  . Nephrolithiasis 05/13/2017  . Pneumonia ~ 09/2015  . Recurrent acute pancreatitis   . Renal disorder     Past Surgical History:  Procedure Laterality Date  . CHOLECYSTECTOMY N/A 10/23/2017   Procedure: LAPAROSCOPIC CHOLECYSTECTOMY WITH INTRAOPERATIVE CHOLANGIOGRAM;  Surgeon: Johnathan Hausen, MD;  Location: [facility];  Service: General;  Laterality: N/A;  . NO PAST SURGERIES       reports that he has never smoked. He has never used smokeless tobacco. He reports previous alcohol use. He reports that he does not use drugs.  Allergies  Allergen Reactions  . Diclofenac Hives  . Norvasc [Amlodipine Besylate] Other (See Comments)    Fluid buildup in chest  . Omeprazole Other (See Comments)    MD stopped due to pancreatitis  . Prednisone Other (See Comments)    Mood swings    Family History  Problem Relation Age of Onset  . Hypertension Other   . Hypertension Mother   . Hypertension Father   . Kidney disease Father   . Hypertension Sister   . Diabetes Sister   . Hypertension Brother     Prior to Admission medications   Medication Sig Start Date End Date Taking? Authorizing Provider  acetaminophen (TYLENOL) 325 MG tablet Take 1-2 tablets by mouth 3 (three) times daily as needed for moderate pain or headache. 08/19/19  Yes [provider]  diphenhydrAMINE (BENADRYL) 25 MG tablet Take 25 mg by mouth daily as needed for itching or allergies.   Yes [provider]  ibuprofen (ADVIL) 800 MG tablet Take 1 tablet (800 mg total) by mouth every 8 (eight) hours as needed. 03/12/19  Yes Fransico Meadow, PA-C  lipase/protease/amylase (CREON) 36000 UNITS CPEP capsule Take 1 capsule (36,000 Units total) by mouth See admin instructions. Take one capsules (36,000 units) by mouth with meals (2 meals daily) and one capsule (36000 units) with snacks daily Patient taking differently:  Take 36,000 Units by mouth See admin instructions. Take 1 capsule by mouth twice daily with meals and 1 capsule with snacks daily 10/14/17  Yes Emokpae, [name], MD  ondansetron (ZOFRAN ODT) 4 MG disintegrating tablet Take 1 tablet (4 mg total) by mouth every 8 (eight) hours as needed for nausea or vomiting. 03/12/19  Yes Fransico Meadow, PA-C  oxyCODONE-acetaminophen (PERCOCET/ROXICET) 5-325 MG tablet Take 1 tablet by mouth every 6 (six) hours as needed for moderate pain. 06/03/19  Yes Mikhail,  Maryann, DO  telmisartan (MICARDIS) 40 MG tablet Take 40 mg by mouth daily. 03/05/19  Yes [provider]  tetrahydrozoline 0.05 % ophthalmic solution Place 1 drop into both eyes daily as needed (Dry eyes).   Yes [provider]    Physical Exam: Constitutional: Moderately built and nourished. Vitals:   08/21/19 0300 08/21/19 0330 08/21/19 0445 08/21/19 0515  BP: (!) 169/106 (!) 168/110 (!) 169/105 (!) 157/97  Pulse: 80 87 83 87  Resp: 18 19 (!) 27 (!) 21  Temp:      TempSrc:      SpO2: 93% 96% 94% 96%   Eyes: Anicteric no pallor. ENMT: No discharge from the ears eyes nose or mouth. Neck: No mass felt.  No neck rigidity. Respiratory: No rhonchi or crepitations. Cardiovascular: S1-S2 heard. Abdomen: Soft mild epigastric tenderness no guarding or rigidity. Musculoskeletal: No edema. Skin: No rash. Neurologic: Alert awake oriented to time place and person.  Moves all extremities. Psychiatric: Appears normal per normal affect.   Labs on Admission: I have personally reviewed following labs and imaging studies  CBC: Recent Labs  Lab 08/18/19 2020 08/20/19 1520  WBC 6.8 8.8  NEUTROABS 3.7  --   HGB 12.6* 13.2  HCT 37.0* 40.6  MCV 99.2 100.5*  PLT 181 [id number]   Basic Metabolic Panel: Recent Labs  Lab 08/18/19 2020 08/20/19 1520  NA 135 136  K 3.7 3.4*  CL 101 101  CO2 22 23  GLUCOSE 97 133*  BUN 10 5*  CREATININE 1.17 1.30*  CALCIUM 8.5* 9.1   GFR: Estimated Creatinine Clearance: 77.2 mL/min (A) (by C-G formula based on SCr of 1.3 mg/dL (H)). Liver Function Tests: Recent Labs  Lab 08/20/19 1520  AST 89*  ALT 57*  ALKPHOS 76  BILITOT 1.3*  PROT 7.1  ALBUMIN 3.7   Recent Labs  Lab 08/20/19 1520  LIPASE 339*   No results for input(s): AMMONIA in the last 168 hours. Coagulation Profile: No results for input(s): INR, PROTIME in the last 168 hours. Cardiac Enzymes: No results for input(s): CKTOTAL, CKMB, CKMBINDEX, TROPONINI in the last  168 hours. BNP (last 3 results) No results for input(s): PROBNP in the last 8760 hours. HbA1C: No results for input(s): HGBA1C in the last 72 hours. CBG: No results for input(s): GLUCAP in the last 168 hours. Lipid Profile: No results for input(s): CHOL, HDL, LDLCALC, TRIG, CHOLHDL, LDLDIRECT in the last 72 hours. Thyroid Function Tests: No results for input(s): TSH, T4TOTAL, FREET4, T3FREE, THYROIDAB in the last 72 hours. Anemia Panel: No results for input(s): VITAMINB12, FOLATE, FERRITIN, TIBC, IRON, RETICCTPCT in the last 72 hours. Urine analysis:    Component Value Date/Time   COLORURINE STRAW (A) 05/27/2019 1225   APPEARANCEUR CLEAR 05/27/2019 1225   LABSPEC 1.009 05/27/2019 1225   PHURINE 5.0 05/27/2019 1225   GLUCOSEU NEGATIVE 05/27/2019 1225   HGBUR NEGATIVE 05/27/2019 [place] 05/27/2019 [place] 05/27/2019 1225   PROTEINUR NEGATIVE  05/27/2019 1225   UROBILINOGEN 0.2 01/20/2015 1643   NITRITE NEGATIVE 05/27/2019 1225   LEUKOCYTESUR NEGATIVE 05/27/2019 1225   Sepsis Labs: @LABRCNTIP (procalcitonin:4,lacticidven:4) )No results found for this or any previous visit (from the past 240 hour(s)).   Radiological Exams on Admission: CT ABDOMEN PELVIS W CONTRAST  Result Date: 08/20/2019 CLINICAL DATA:  49 year old male with abdominal pain. Concern for acute pancreatitis. EXAM: CT ABDOMEN AND PELVIS WITH CONTRAST TECHNIQUE: Multidetector CT imaging of the abdomen and pelvis was performed using the standard protocol following bolus administration of intravenous contrast. CONTRAST:  150mL OMNIPAQUE IOHEXOL 300 MG/ML  SOLN COMPARISON:  CT abdomen pelvis dated 02/04/2019. FINDINGS: Lower chest: The visualized lung bases are clear. No intra-abdominal free air or free fluid. Hepatobiliary: Diffuse fatty infiltration of the liver. No intrahepatic biliary ductal dilatation. Cholecystectomy. Pancreas: There is diffuse inflammatory changes of the pancreas  consistent with acute pancreatitis. There is a 4 mm calcific focus in the region of the uncinate process of the pancreas, likely sequela of prior pancreatitis. There is no drainable fluid collection/abscess or pseudocyst. Spleen: Normal in size without focal abnormality. Adrenals/Urinary Tract: The adrenal glands are unremarkable. The right kidney is atrophic. Small right renal cystic lesions are too small to characterize but appears similar to prior CT. Two adjacent nonobstructing stones noted in the inferior pole of the left kidney each measuring approximately 6 mm. No hydronephrosis. The left ureter and urinary bladder appear unremarkable. Stomach/Bowel: Several small scattered sigmoid diverticula without active inflammatory changes. There is no bowel obstruction or active inflammation. The appendix is normal. Vascular/Lymphatic: Mild aortoiliac atherosclerotic disease. The IVC is unremarkable. No portal venous gas. There is no adenopathy. Reproductive: The prostate and seminal vesicles are grossly unremarkable. Other: None Musculoskeletal: Degenerative changes at L5-S1 with disc desiccation and vacuum phenomena. No acute osseous pathology. IMPRESSION: 1. Acute pancreatitis. No abscess or pseudocyst. 2. Fatty liver. 3. No bowel obstruction. Normal appendix. 4. Aortic Atherosclerosis (ICD10-I70.0). Electronically Signed   By: Anner Crete M.D.   On: 08/20/2019 21:21    EKG: Independently reviewed.  Normal sinus rhythm.  Assessment/Plan Principal Problem:   Acute pancreatitis Active Problems:   Essential hypertension   Congenital single kidney    1. Acute on chronic pancreatitis cause of which not clear differential is wide including possible alcohol induced but has not been drinking any alcohol recently has had empiric cholecystectomy previously and also pancreatic divisum or ACE inhibitor induced.  Presently not on any ACE inhibitor only takes ARB.  Will recheck LFTs if there is any further  worsening may need MRCP.  N.p.o. continue with aggressive IV fluid hydration pain relief medication if pain does not get better may need need GI input.  Check triglyceride levels. 2. Acute renal failure with hypokalemia likely from dehydration from vomiting and diarrhea.  Replace recheck follow metabolic panel intake output. 3. Hypertensive urgency likely from not able to take his home medication with creasing pain also contributing.  As needed IV hydralazine for now. 4. Elevated LFTs if further worsening may need MRCP.  Follow LFTs.  Covid test is pending.  Since patient has acute pancreatitis with persistent pain with nausea vomiting tachycardic will need close monitoring for any further worsening in inpatient status.   DVT prophylaxis: Heparin. Code Status: Full code. Family Communication: Discussed with patient. Disposition Plan: Home. Consults called: None. Admission status: Inpatient.   Rise Patience MD [facility] Pager [phone].  If 7PM-7AM, please contact night-coverage [email or web address] Password [facility]  08/21/2019, 5:40 AM

## 2019-08-21 NOTE — Progress Notes (Signed)
PROGRESS NOTE    Kyle Berry  [id number] DOB: 11/25/1970 DOA: 08/20/2019 PCP: [name] (Inactive)    Brief Narrative:  49 y.o. 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  Unable to keep anything pain worsening.  Denies any shortness of breath or chest pain had some subjective feeling of fever chills.  Assessment & Plan:   Principal Problem:   Acute pancreatitis Active Problems:   Essential hypertension   Congenital single kidney  1. Acute on chronic pancreatitis  1. Unclear etiology 2. No recent ETOH hx reported, TG reviewed and unremarkable 3. Pt is s/p cholecystectomy 4. Cont IVF hydration and analgesia for now 5. Pt reports symptoms seem better today 6. Follow lipase trends 7. Advance diet as pt tolerates 2. Acute renal failure with hypokalemia  1. likely from dehydration from vomiting and diarrhea.   2. Potassium replaced and normal this AM 3. Repeat bmet in AM 3. Hypertensive urgency  1. likely from not able to take his home medication with creasing pain also contributing.   2. Continued on PRN IV hydralazine for now. 4. Elevated LFTs  1. Trending down 2. Suspect related to presenting acute on chronic pancreatitis flare 3. Follow LFT trends  DVT prophylaxis: Heparin subq Code Status: Full Family Communication: Pt in room, family not at bedside  Status is: Inpatient  Remains inpatient appropriate because:Ongoing active pain requiring inpatient pain management, IV treatments appropriate due to intensity of illness or inability to take PO and Inpatient level of care appropriate due to severity of illness   Dispo: The patient  is from: Home              Anticipated d/c is to: Home              Anticipated d/c date is: > 3 days              Patient currently is not medically stable to d/c.        Consultants:     Procedures:     Antimicrobials: Anti-infectives (From admission, onward)   None       Subjective: Still nauseated and complaining of residual abd pain, but overall, feeling better today  Objective: Vitals:   08/21/19 0719 08/21/19 0840 08/21/19 1033 08/21/19 1517  BP: (!) 143/88 (!) 151/96 (!) 142/92 (!) 137/91  Pulse: (!) 127 (!) 109 (!) 109 98  Resp: 16 17 17 16   Temp: 98.4 F (36.9 C) 98.8 F (37.1 C) 98.6 F (37 C) 98.6 F (37 C)  TempSrc: Oral Oral Oral Oral  SpO2: 97% 98% 97% 94%  Weight:      Height:        Intake/Output Summary (Last 24 hours) at 08/21/2019 1638 Last data filed at 08/21/2019 1600 Gross per 24 hour  Intake 4156.95 ml  Output 500 ml  Net 3656.95 ml   Filed Weights   08/21/19 0648  Weight: 79.3 kg    Examination:  General exam: Appears calm and comfortable  Respiratory system: Clear to auscultation. Respiratory effort normal. Cardiovascular system: S1 & S2 heard, Regular Gastrointestinal system: Abdomen is nondistended, pos BS, generally tender Central nervous system: Alert and oriented. No  focal neurological deficits. Extremities: Symmetric 5 x 5 power. Skin: No rashes, lesions Psychiatry: Judgement and insight appear normal. Mood & affect appropriate.   Data Reviewed: I have personally reviewed following labs and imaging studies  CBC: Recent Labs  Lab 08/18/19 2020 08/20/19 1520 08/21/19 0639  WBC 6.8 8.8 9.5  NEUTROABS 3.7  --  8.2*  HGB 12.6* 13.2 12.2*  HCT 37.0* 40.6 37.3*  MCV 99.2 100.5* 100.8*  PLT 181 182 [id number]   Basic Metabolic Panel: Recent Labs  Lab 08/18/19 2020 08/20/19 1520 08/21/19 0639  NA 135 136 138  K 3.7 3.4* 4.0  CL 101 101 104  CO2 22 23 21*  GLUCOSE 97 133* 113*  BUN 10 5* <5*  CREATININE 1.17  1.30* 1.06  CALCIUM 8.5* 9.1 8.2*   GFR: Estimated Creatinine Clearance: 94.6 mL/min (by C-G formula based on SCr of 1.06 mg/dL). Liver Function Tests: Recent Labs  Lab 08/20/19 1520 08/21/19 0639  AST 89* 62*  ALT 57* 45*  ALKPHOS 76 71  BILITOT 1.3* 1.3*  PROT 7.1 6.6  ALBUMIN 3.7 3.1*   Recent Labs  Lab 08/20/19 1520  LIPASE 339*   No results for input(s): AMMONIA in the last 168 hours. Coagulation Profile: No results for input(s): INR, PROTIME in the last 168 hours. Cardiac Enzymes: No results for input(s): CKTOTAL, CKMB, CKMBINDEX, TROPONINI in the last 168 hours. BNP (last 3 results) No results for input(s): PROBNP in the last 8760 hours. HbA1C: No results for input(s): HGBA1C in the last 72 hours. CBG: No results for input(s): GLUCAP in the last 168 hours. Lipid Profile: Recent Labs    08/21/19 0639  TRIG 32   Thyroid Function Tests: No results for input(s): TSH, T4TOTAL, FREET4, T3FREE, THYROIDAB in the last 72 hours. Anemia Panel: No results for input(s): VITAMINB12, FOLATE, FERRITIN, TIBC, IRON, RETICCTPCT in the last 72 hours. Sepsis Labs: No results for input(s): PROCALCITON, LATICACIDVEN in the last 168 hours.  Recent Results (from the past 240 hour(s))  Respiratory Panel by RT PCR (Flu A&B, Covid) - Nasopharyngeal Swab     Status: None   Collection Time: 08/21/19  5:39 AM   Specimen: Nasopharyngeal Swab  Result Value Ref Range Status   SARS Coronavirus 2 by RT PCR NEGATIVE NEGATIVE Final    Comment: (NOTE) SARS-CoV-2 target nucleic acids are NOT DETECTED. The SARS-CoV-2 RNA is generally detectable in upper respiratoy specimens during the acute phase of infection. The lowest concentration of SARS-CoV-2 viral copies this assay can detect is 131 copies/mL. A negative result does not preclude SARS-Cov-2 infection and should not be used as the sole basis for treatment or other patient management decisions. A negative result may occur with  improper  specimen collection/handling, submission of specimen other than nasopharyngeal swab, presence of viral mutation(s) within the areas targeted by this assay, and inadequate number of viral copies (<131 copies/mL). A negative result must be combined with clinical observations, patient history, and epidemiological information. The expected result is Negative. Fact Sheet for Patients:  PinkCheek.be Fact Sheet for Healthcare Providers:  GravelBags.it This test is not yet ap proved or cleared by the [place] FDA and  has been authorized for detection and/or diagnosis of SARS-CoV-2 by FDA under an Emergency Use Authorization (EUA). This EUA will remain  in effect (meaning this test can be used) for the duration of the COVID-19 declaration under Section 564(b)(1) of the Act, 21 U.S.C. section 360bbb-3(b)(1), unless the authorization is terminated or revoked sooner.  Influenza A by PCR NEGATIVE NEGATIVE Final   Influenza B by PCR NEGATIVE NEGATIVE Final    Comment: (NOTE) The Xpert Xpress SARS-CoV-2/FLU/RSV assay is intended as an aid in  the diagnosis of influenza from Nasopharyngeal swab specimens and  should not be used as a sole basis for treatment. Nasal washings and  aspirates are unacceptable for Xpert Xpress SARS-CoV-2/FLU/RSV  testing. Fact Sheet for Patients: PinkCheek.be Fact Sheet for Healthcare Providers: GravelBags.it This test is not yet approved or cleared by the [place] FDA and  has been authorized for detection and/or diagnosis of SARS-CoV-2 by  FDA under an Emergency Use Authorization (EUA). This EUA will remain  in effect (meaning this test can be used) for the duration of the  Covid-19 declaration under Section 564(b)(1) of the Act, 21  U.S.C. section 360bbb-3(b)(1), unless the authorization is  terminated or revoked. Performed at [facility], [place] [address], [place], [place] [address]      Radiology Studies: CT ABDOMEN PELVIS W CONTRAST  Result Date: 08/20/2019 CLINICAL DATA:  49 year old male with abdominal pain. Concern for acute pancreatitis. EXAM: CT ABDOMEN AND PELVIS WITH CONTRAST TECHNIQUE: Multidetector CT imaging of the abdomen and pelvis was performed using the standard protocol following bolus administration of intravenous contrast. CONTRAST:  165mL OMNIPAQUE IOHEXOL 300 MG/ML  SOLN COMPARISON:  CT abdomen pelvis dated 02/04/2019. FINDINGS: Lower chest: The visualized lung bases are clear. No intra-abdominal free air or free fluid. Hepatobiliary: Diffuse fatty infiltration of the liver. No intrahepatic biliary ductal dilatation. Cholecystectomy. Pancreas: There is diffuse inflammatory changes of the pancreas consistent with acute pancreatitis. There is a 4 mm calcific focus in the region of the uncinate process of the pancreas, likely sequela of prior pancreatitis. There is no drainable fluid collection/abscess or pseudocyst. Spleen: Normal in size without focal abnormality. Adrenals/Urinary Tract: The adrenal glands are unremarkable. The right kidney is atrophic. Small right renal cystic lesions are too small to characterize but appears similar to prior CT. Two adjacent nonobstructing stones noted in the inferior pole of the left kidney each measuring approximately 6 mm. No hydronephrosis. The left ureter and urinary bladder appear unremarkable. Stomach/Bowel: Several small scattered sigmoid diverticula without active inflammatory changes. There is no bowel obstruction or active inflammation. The appendix is normal. Vascular/Lymphatic: Mild aortoiliac atherosclerotic disease. The IVC is unremarkable. No portal venous gas. There is no adenopathy. Reproductive: The prostate and seminal vesicles are grossly unremarkable. Other: None Musculoskeletal: Degenerative changes at L5-S1 with disc desiccation and vacuum phenomena. No  acute osseous pathology. IMPRESSION: 1. Acute pancreatitis. No abscess or pseudocyst. 2. Fatty liver. 3. No bowel obstruction. Normal appendix. 4. Aortic Atherosclerosis (ICD10-I70.0). Electronically Signed   By: Anner Crete M.D.   On: 08/20/2019 21:21    Scheduled Meds: . heparin  5,000 Units Subcutaneous Q8H   Continuous Infusions: . lactated ringers 150 mL/hr at 08/21/19 1515     LOS: 0 days   Marylu Lund, MD [facility] Pager On Amion  If 7PM-7AM, please contact night-coverage 08/21/2019, 4:38 PM

## 2019-08-21 NOTE — Progress Notes (Signed)
   08/21/19 0719  Assess: MEWS Score  Temp 98.4 F (36.9 C)  BP (!) 143/88  Pulse Rate (!) 127  Resp 16  Level of Consciousness Alert  SpO2 97 %  O2 Device Room Air  Assess: MEWS Score  MEWS Temp 0  MEWS Systolic 0  MEWS Pulse 2  MEWS RR 0  MEWS LOC 0  MEWS Score 2  MEWS Score Color Yellow  Assess: if the MEWS score is Yellow or Red  Were vital signs taken at a resting state? Yes  Focused Assessment Documented focused assessment  Early Detection of Sepsis Score *See Row Information* Low  MEWS guidelines implemented *See Row Information* Yes  Treat  MEWS Interventions Other (Comment) (PRN medications administered by night shift RN)  Take Vital Signs  Increase Vital Sign Frequency  Yellow: Q 2hr X 2 then Q 4hr X 2, if remains yellow, continue Q 4hrs  Escalate  MEWS: Escalate Yellow: discuss with charge nurse/RN and consider discussing with provider and RRT  Notify: Charge Nurse/RN  Name of Charge Nurse/RN Notified Towanda Octave (I am the charge RN)  Notify: Provider  Provider Name/Title Dr. Earlie Counts  Date Provider Notified 08/21/19  Time Provider Notified [phone]  Notification Type Page  Notification Reason Change in status;Other (Comment) (Elevated HR)  Response Other (Comment) (Awaiting orders)   Patient's HR elevated as high as 160's (Patient was moving around in room) this morning, currently HR 114 (resting). Dr. Earlie Counts made aware. Patient has no prn medications to administer at this time. Awaiting new orders. Will continue to monitor.

## 2019-08-22 DIAGNOSIS — Q600 Renal agenesis, unilateral: Secondary | ICD-10-CM

## 2019-08-22 LAB — COMPREHENSIVE METABOLIC PANEL
ALT: 32 U/L (ref 0–44)
AST: 42 U/L — ABNORMAL HIGH (ref 15–41)
Albumin: 2.8 g/dL — ABNORMAL LOW (ref 3.5–5.0)
Alkaline Phosphatase: 58 U/L (ref 38–126)
Anion gap: 12 (ref 5–15)
BUN: <5 mg/dL — ABNORMAL LOW (ref 6–20)
CO2: 24 mmol/L (ref 22–32)
Calcium: 8.5 mg/dL — ABNORMAL LOW (ref 8.9–10.3)
Chloride: 96 mmol/L — ABNORMAL LOW (ref 98–111)
Creatinine, Ser: 1.09 mg/dL (ref 0.61–1.24)
GFR calc Af Amer: >60 mL/min (ref >60)
GFR calc non Af Amer: >60 mL/min (ref >60)
Glucose, Bld: 87 mg/dL (ref 70–99)
Potassium: 3.8 mmol/L (ref 3.5–5.1)
Sodium: 132 mmol/L — ABNORMAL LOW (ref 135–145)
Total Bilirubin: 1 mg/dL (ref 0.3–1.2)
Total Protein: 6.1 g/dL — ABNORMAL LOW (ref 6.5–8.1)

## 2019-08-22 LAB — GLUCOSE, CAPILLARY
Glucose-Capillary: 118 mg/dL — ABNORMAL HIGH (ref 70–99)
Glucose-Capillary: 68 mg/dL — ABNORMAL LOW (ref 70–99)
Glucose-Capillary: 84 mg/dL (ref 70–99)
Glucose-Capillary: 95 mg/dL (ref 70–99)
Glucose-Capillary: 96 mg/dL (ref 70–99)

## 2019-08-22 LAB — LIPASE, BLOOD: Lipase: 104 U/L — ABNORMAL HIGH (ref 11–51)

## 2019-08-22 MED ORDER — LACTATED RINGERS IV SOLN: INTRAVENOUS | Status: DC

## 2019-08-22 MED ORDER — DEXTROSE-NACL 5-0.9 % IV SOLN: INTRAVENOUS | Status: DC

## 2019-08-22 NOTE — Progress Notes (Signed)
Cbg of 68, patient complained of dizziness when up. MD notified. 4 ounces of juice given ( per MD's consrnt) and IVF changed per order. Cbg  Rechecked and it was 118 .

## 2019-08-22 NOTE — Plan of Care (Signed)

## 2019-08-22 NOTE — Progress Notes (Signed)
PROGRESS NOTE    Kyle Berry  [id number] DOB: 1970/08/06 DOA: 08/20/2019 PCP: [name] (Inactive)    Brief Narrative:  49 y.o. 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  Unable to keep anything pain worsening.  Denies any shortness of breath or chest pain had some subjective feeling of fever chills.  Assessment & Plan:   Principal Problem:   Acute pancreatitis Active Problems:   Essential hypertension   Congenital single kidney  1. Acute on chronic pancreatitis  1. Unclear etiology 2. No recent ETOH hx reported, TG reviewed and unremarkable 3. Pt is s/p cholecystectomy 4. Cont IVF hydration and analgesia for now 5. Pt reports symptoms seem better today 6. Lipase down to 100's 7. Pt did not tolerate clears today, will resume ice chips with IVF for now 8. Repeat lipase in AM and cont to advance as patient can tolerate 2. Acute renal failure with hypokalemia  1. likely from dehydration from vomiting and diarrhea.   2. Potassium replaced and remains normal this AM 3. Recheck bmet in AM 3. Hypertensive urgency  1. likely from not able to take his home medication with creasing pain also contributing.   2. Continued on PRN IV hydralazine for now. 4. Elevated LFTs  1. Continues to trend down 2. Suspect related to presenting acute on chronic pancreatitis flare 3. Follow LFT trends in AM  DVT prophylaxis: Heparin subq Code Status: Full Family Communication: Pt in room, family not at bedside  Status is: Inpatient  Remains inpatient appropriate because:Ongoing active pain requiring inpatient pain management, IV treatments appropriate due to  intensity of illness or inability to take PO and Inpatient level of care appropriate due to severity of illness   Dispo: The patient is from: Home              Anticipated d/c is to: Home              Anticipated d/c date is: > 3 days              Patient currently is not medically stable to d/c.   Consultants:     Procedures:     Antimicrobials: Anti-infectives (From admission, onward)   None      Subjective: Complaining of abd pain and did not tolerate clears.  Objective: Vitals:   08/21/19 1033 08/21/19 1517 08/21/19 2000 08/22/19 0426  BP: (!) 142/92 (!) 137/91 125/90 (!) 134/91  Pulse: (!) 109 98 97 86  Resp: 17 16 16 18   Temp: 98.6 F (37 C) 98.6 F (37 C) 98.7 F (37.1 C) 98.6 F (37 C)  TempSrc: Oral Oral Oral Oral  SpO2: 97% 94% 97% 96%  Weight:      Height:        Intake/Output Summary (Last 24 hours) at 08/22/2019 1459 Last data filed at 08/22/2019 0500 Gross per 24 hour  Intake 3497.7 ml  Output 200 ml  Net 3297.7 ml   Filed Weights   08/21/19 0648  Weight: 79.3 kg    Examination: General exam: Awake, laying in bed, in nad Respiratory system: Normal respiratory effort, no wheezing Cardiovascular system: regular rate, s1, s2 Gastrointestinal system: Soft,  nondistended, positive BS Central nervous system: CN2-12 grossly intact, strength intact Extremities: Perfused, no clubbing Skin: Normal skin turgor, no notable skin lesions seen Psychiatry: Mood normal // no visual hallucinations   Data Reviewed: I have personally reviewed following labs and imaging studies  CBC: Recent Labs  Lab 08/18/19 2020 08/20/19 1520 08/21/19 0639  WBC 6.8 8.8 9.5  NEUTROABS 3.7  --  8.2*  HGB 12.6* 13.2 12.2*  HCT 37.0* 40.6 37.3*  MCV 99.2 100.5* 100.8*  PLT 181 182 [id number]   Basic Metabolic Panel: Recent Labs  Lab 08/18/19 2020 08/20/19 1520 08/21/19 0639 08/22/19 0447  NA 135 136 138 132*  K 3.7 3.4* 4.0 3.8  CL 101 101 104 96*  CO2 22 23 21* 24   GLUCOSE 97 133* 113* 87  BUN 10 5* <5* <5*  CREATININE 1.17 1.30* 1.06 1.09  CALCIUM 8.5* 9.1 8.2* 8.5*   GFR: Estimated Creatinine Clearance: 92 mL/min (by C-G formula based on SCr of 1.09 mg/dL). Liver Function Tests: Recent Labs  Lab 08/20/19 1520 08/21/19 0639 08/22/19 0447  AST 89* 62* 42*  ALT 57* 45* 32  ALKPHOS 76 71 58  BILITOT 1.3* 1.3* 1.0  PROT 7.1 6.6 6.1*  ALBUMIN 3.7 3.1* 2.8*   Recent Labs  Lab 08/20/19 1520 08/22/19 0447  LIPASE 339* 104*   No results for input(s): AMMONIA in the last 168 hours. Coagulation Profile: No results for input(s): INR, PROTIME in the last 168 hours. Cardiac Enzymes: No results for input(s): CKTOTAL, CKMB, CKMBINDEX, TROPONINI in the last 168 hours. BNP (last 3 results) No results for input(s): PROBNP in the last 8760 hours. HbA1C: No results for input(s): HGBA1C in the last 72 hours. CBG: Recent Labs  Lab 08/21/19 1643 08/22/19 0019 08/22/19 0816  GLUCAP 85 96 84   Lipid Profile: Recent Labs    08/21/19 0639  TRIG 32   Thyroid Function Tests: No results for input(s): TSH, T4TOTAL, FREET4, T3FREE, THYROIDAB in the last 72 hours. Anemia Panel: No results for input(s): VITAMINB12, FOLATE, FERRITIN, TIBC, IRON, RETICCTPCT in the last 72 hours. Sepsis Labs: No results for input(s): PROCALCITON, LATICACIDVEN in the last 168 hours.  Recent Results (from the past 240 hour(s))  Respiratory Panel by RT PCR (Flu A&B, Covid) - Nasopharyngeal Swab     Status: None   Collection Time: 08/21/19  5:39 AM   Specimen: Nasopharyngeal Swab  Result Value Ref Range Status   SARS Coronavirus 2 by RT PCR NEGATIVE NEGATIVE Final    Comment: (NOTE) SARS-CoV-2 target nucleic acids are NOT DETECTED. The SARS-CoV-2 RNA is generally detectable in upper respiratoy specimens during the acute phase of infection. The lowest concentration of SARS-CoV-2 viral copies this assay can detect is 131 copies/mL. A negative result does not preclude  SARS-Cov-2 infection and should not be used as the sole basis for treatment or other patient management decisions. A negative result may occur with  improper specimen collection/handling, submission of specimen other than nasopharyngeal swab, presence of viral mutation(s) within the areas targeted by this assay, and inadequate number of viral copies (<131 copies/mL). A negative result must be combined with clinical observations, patient history, and epidemiological information. The expected result is Negative. Fact Sheet for Patients:  PinkCheek.be Fact Sheet for Healthcare Providers:  GravelBags.it This test is not yet ap proved or cleared by the [place] FDA and  has been authorized for detection and/or diagnosis of SARS-CoV-2 by FDA under an Emergency Use Authorization (EUA). This EUA will remain  in effect (meaning this test can be used) for the duration of the COVID-19 declaration under Section 564(b)(1) of the Act, 21 U.S.C. section 360bbb-3(b)(1), unless the authorization is terminated or revoked sooner.    Influenza A by PCR NEGATIVE NEGATIVE Final   Influenza B by PCR NEGATIVE NEGATIVE Final    Comment: (NOTE) The Xpert Xpress SARS-CoV-2/FLU/RSV assay is intended as an aid in  the diagnosis of influenza from Nasopharyngeal swab specimens and  should not be used as a sole basis for treatment. Nasal washings and  aspirates are unacceptable for Xpert Xpress SARS-CoV-2/FLU/RSV  testing. Fact Sheet for Patients: PinkCheek.be Fact Sheet for Healthcare Providers: GravelBags.it This test is not yet approved or cleared by the [place] FDA and  has been authorized for detection and/or diagnosis of SARS-CoV-2 by  FDA under an Emergency Use Authorization (EUA). This EUA will remain  in effect (meaning this test can be used) for the duration of the  Covid-19  declaration under Section 564(b)(1) of the Act, 21  U.S.C. section 360bbb-3(b)(1), unless the authorization is  terminated or revoked. Performed at [facility], [place] [address] [place], [place] [address]      Radiology Studies: CT ABDOMEN PELVIS W CONTRAST  Result Date: 08/20/2019 CLINICAL DATA:  49 year old male with abdominal pain. Concern for acute pancreatitis. EXAM: CT ABDOMEN AND PELVIS WITH CONTRAST TECHNIQUE: Multidetector CT imaging of the abdomen and pelvis was performed using the standard protocol following bolus administration of intravenous contrast. CONTRAST:  180mL OMNIPAQUE IOHEXOL 300 MG/ML  SOLN COMPARISON:  CT abdomen pelvis dated 02/04/2019. FINDINGS: Lower chest: The visualized lung bases are clear. No intra-abdominal free air or free fluid. Hepatobiliary: Diffuse fatty infiltration of the liver. No intrahepatic biliary ductal dilatation. Cholecystectomy. Pancreas: There is diffuse inflammatory changes of the pancreas consistent with acute pancreatitis. There is a 4 mm calcific focus in the region of the uncinate process of the pancreas, likely sequela of prior pancreatitis. There is no drainable fluid collection/abscess or pseudocyst. Spleen: Normal in size without focal abnormality. Adrenals/Urinary Tract: The adrenal glands are unremarkable. The right kidney is atrophic. Small right renal cystic lesions are too small to characterize but appears similar to prior CT. Two adjacent nonobstructing stones noted in the inferior pole of the left kidney each measuring approximately 6 mm. No hydronephrosis. The left ureter and urinary bladder appear unremarkable. Stomach/Bowel: Several small scattered sigmoid diverticula without active inflammatory changes. There is no bowel obstruction or active inflammation. The appendix is normal. Vascular/Lymphatic: Mild aortoiliac atherosclerotic disease. The IVC is unremarkable. No portal venous gas. There is no adenopathy. Reproductive: The  prostate and seminal vesicles are grossly unremarkable. Other: None Musculoskeletal: Degenerative changes at L5-S1 with disc desiccation and vacuum phenomena. No acute osseous pathology. IMPRESSION: 1. Acute pancreatitis. No abscess or pseudocyst. 2. Fatty liver. 3. No bowel obstruction. Normal appendix. 4. Aortic Atherosclerosis (ICD10-I70.0). Electronically Signed   By: Anner Crete M.D.   On: 08/20/2019 21:21    Scheduled Meds: . heparin  5,000 Units Subcutaneous Q8H   Continuous Infusions: . lactated ringers 150 mL/hr at 08/22/19 1207     LOS: 1 day   Marylu Lund, MD [facility] Pager On Amion  If 7PM-7AM, please contact night-coverage 08/22/2019, 2:59 PM

## 2019-08-23 LAB — COMPREHENSIVE METABOLIC PANEL
ALT: 30 U/L (ref 0–44)
AST: 35 U/L (ref 15–41)
Albumin: 2.6 g/dL — ABNORMAL LOW (ref 3.5–5.0)
Alkaline Phosphatase: 59 U/L (ref 38–126)
Anion gap: 10 (ref 5–15)
BUN: <5 mg/dL — ABNORMAL LOW (ref 6–20)
CO2: 27 mmol/L (ref 22–32)
Calcium: 8.8 mg/dL — ABNORMAL LOW (ref 8.9–10.3)
Chloride: 99 mmol/L (ref 98–111)
Creatinine, Ser: 1.09 mg/dL (ref 0.61–1.24)
GFR calc Af Amer: >60 mL/min (ref >60)
GFR calc non Af Amer: >60 mL/min (ref >60)
Glucose, Bld: 99 mg/dL (ref 70–99)
Potassium: 3.8 mmol/L (ref 3.5–5.1)
Sodium: 136 mmol/L (ref 135–145)
Total Bilirubin: 1 mg/dL (ref 0.3–1.2)
Total Protein: 6 g/dL — ABNORMAL LOW (ref 6.5–8.1)

## 2019-08-23 LAB — GLUCOSE, CAPILLARY
Glucose-Capillary: 100 mg/dL — ABNORMAL HIGH (ref 70–99)
Glucose-Capillary: 126 mg/dL — ABNORMAL HIGH (ref 70–99)

## 2019-08-23 LAB — LIPASE, BLOOD: Lipase: 68 U/L — ABNORMAL HIGH (ref 11–51)

## 2019-08-23 MED ORDER — PANCRELIPASE (LIP-PROT-AMYL) 36000-114000 UNITS PO CPEP
36000.0000 [IU] | ORAL_CAPSULE | Freq: Every day | ORAL | Status: DC | PRN
  Administered 2019-08-23: 36000 [IU] via ORAL
  Filled 2019-08-23: qty 1

## 2019-08-23 MED ORDER — PANCRELIPASE (LIP-PROT-AMYL) 36000-114000 UNITS PO CPEP
36000.0000 [IU] | ORAL_CAPSULE | Freq: Two times a day (BID) | ORAL | Status: DC
  Administered 2019-08-23 – 2019-08-28 (×9): 36000 [IU] via ORAL
  Filled 2019-08-23 (×9): qty 1

## 2019-08-23 NOTE — Plan of Care (Signed)

## 2019-08-23 NOTE — Plan of Care (Signed)
  Problem: Activity: Goal: Risk for activity intolerance will decrease Outcome: Progressing   Problem: Safety: Goal: Ability to remain free from injury will improve Outcome: Progressing   

## 2019-08-23 NOTE — Progress Notes (Signed)
PROGRESS NOTE    Kyle Berry  [id number] DOB: 24-Oct-1970 DOA: 08/20/2019 PCP: [name] (Inactive)    Brief Narrative:  49 y.o. 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  Unable to keep anything pain worsening.  Denies any shortness of breath or chest pain had some subjective feeling of fever chills.  Assessment & Plan:   Principal Problem:   Acute pancreatitis Active Problems:   Essential hypertension   Congenital single kidney  1. Acute on chronic pancreatitis  1. Unclear etiology 2. No recent ETOH hx reported, TG reviewed and unremarkable 3. Pt is s/p cholecystectomy 4. Cont IVF hydration and analgesia for now 5. Pt reports symptoms seem better today 6. Lipase down to 68 7. Pt did not tolerate clears yesterday 8. Today, pt reports feeling better, willing to try clears again 9. Will resume home Creon with advancing diet 2. Acute renal failure with hypokalemia  1. likely from dehydration from vomiting and diarrhea.   2. Potassium stable and normal this AM 3. Recheck bmet in AM 3. Hypertensive urgency  1. likely from not able to take his home medication with creasing pain also contributing.   2. Continued on PRN IV hydralazine as needed. 4. Elevated LFTs  1. Trended down 2. Suspect related to presenting acute on chronic pancreatitis flare 3. Follow LFT trends in AM  DVT prophylaxis: Heparin subq Code Status: Full Family Communication: Pt in room, family not at bedside  Status is: Inpatient  Remains inpatient appropriate because:Ongoing active pain requiring inpatient pain management, IV treatments appropriate due to intensity of  illness or inability to take PO and Inpatient level of care appropriate due to severity of illness   Dispo: The patient is from: Home              Anticipated d/c is to: Home              Anticipated d/c date is: > 3 days              Patient currently is not medically stable to d/c.   Consultants:     Procedures:     Antimicrobials: Anti-infectives (From admission, onward)   None      Subjective: States feeling better, willing to try clears  Objective: Vitals:   08/22/19 1513 08/22/19 2133 08/23/19 0447 08/23/19 1501  BP: (!) 144/95 (!) 146/95 (!) 152/93 (!) 132/96  Pulse: 83 88 74 76  Resp: 19 18 17 16   Temp: 98.6 F (37 C) 99 F (37.2 C) 98.4 F (36.9 C) 98.2 F (36.8 C)  TempSrc: Oral Oral Oral Oral  SpO2: 97% 98% 94% 98%  Weight:      Height:        Intake/Output Summary (Last 24 hours) at 08/23/2019 1620 Last data filed at 08/23/2019 1514 Gross per 24 hour  Intake 3803.93 ml  Output --  Net 3803.93 ml   Filed Weights   08/21/19 0648  Weight: 79.3 kg    Examination: General exam: Conversant, in no acute distress Respiratory system: normal chest rise, clear, no audible wheezing Cardiovascular system: regular rhythm, s1-s2 Gastrointestinal system: Nondistended, nontender, pos BS Central nervous system: No seizures,  no tremors Extremities: No cyanosis, no joint deformities Skin: No rashes, no pallor Psychiatry: Affect normal // no auditory hallucinations   Data Reviewed: I have personally reviewed following labs and imaging studies  CBC: Recent Labs  Lab 08/18/19 2020 08/20/19 1520 08/21/19 0639  WBC 6.8 8.8 9.5  NEUTROABS 3.7  --  8.2*  HGB 12.6* 13.2 12.2*  HCT 37.0* 40.6 37.3*  MCV 99.2 100.5* 100.8*  PLT 181 182 [id number]   Basic Metabolic Panel: Recent Labs  Lab 08/18/19 2020 08/20/19 1520 08/21/19 0639 08/22/19 0447 08/23/19 0153  NA 135 136 138 132* 136  K 3.7 3.4* 4.0 3.8 3.8  CL 101 101 104 96* 99  CO2 22 23 21* 24 27   GLUCOSE 97 133* 113* 87 99  BUN 10 5* <5* <5* <5*  CREATININE 1.17 1.30* 1.06 1.09 1.09  CALCIUM 8.5* 9.1 8.2* 8.5* 8.8*   GFR: Estimated Creatinine Clearance: 92 mL/min (by C-G formula based on SCr of 1.09 mg/dL). Liver Function Tests: Recent Labs  Lab 08/20/19 1520 08/21/19 0639 08/22/19 0447 08/23/19 0153  AST 89* 62* 42* 35  ALT 57* 45* 32 30  ALKPHOS 76 71 58 59  BILITOT 1.3* 1.3* 1.0 1.0  PROT 7.1 6.6 6.1* 6.0*  ALBUMIN 3.7 3.1* 2.8* 2.6*   Recent Labs  Lab 08/20/19 1520 08/22/19 0447 08/23/19 0153  LIPASE 339* 104* 68*   No results for input(s): AMMONIA in the last 168 hours. Coagulation Profile: No results for input(s): INR, PROTIME in the last 168 hours. Cardiac Enzymes: No results for input(s): CKTOTAL, CKMB, CKMBINDEX, TROPONINI in the last 168 hours. BNP (last 3 results) No results for input(s): PROBNP in the last 8760 hours. HbA1C: No results for input(s): HGBA1C in the last 72 hours. CBG: Recent Labs  Lab 08/22/19 0816 08/22/19 1702 08/22/19 1800 08/22/19 2354 08/23/19 0824  GLUCAP 84 68* 118* 95 100*   Lipid Profile: Recent Labs    08/21/19 0639  TRIG 32   Thyroid Function Tests: No results for input(s): TSH, T4TOTAL, FREET4, T3FREE, THYROIDAB in the last 72 hours. Anemia Panel: No results for input(s): VITAMINB12, FOLATE, FERRITIN, TIBC, IRON, RETICCTPCT in the last 72 hours. Sepsis Labs: No results for input(s): PROCALCITON, LATICACIDVEN in the last 168 hours.  Recent Results (from the past 240 hour(s))  Respiratory Panel by RT PCR (Flu A&B, Covid) - Nasopharyngeal Swab     Status: None   Collection Time: 08/21/19  5:39 AM   Specimen: Nasopharyngeal Swab  Result Value Ref Range Status   SARS Coronavirus 2 by RT PCR NEGATIVE NEGATIVE Final    Comment: (NOTE) SARS-CoV-2 target nucleic acids are NOT DETECTED. The SARS-CoV-2 RNA is generally detectable in upper respiratoy specimens during the acute phase of infection. The  lowest concentration of SARS-CoV-2 viral copies this assay can detect is 131 copies/mL. A negative result does not preclude SARS-Cov-2 infection and should not be used as the sole basis for treatment or other patient management decisions. A negative result may occur with  improper specimen collection/handling, submission of specimen other than nasopharyngeal swab, presence of viral mutation(s) within the areas targeted by this assay, and inadequate number of viral copies (<131 copies/mL). A negative result must be combined with clinical observations, patient history, and epidemiological information. The expected result is Negative. Fact Sheet for Patients:  PinkCheek.be Fact Sheet for Healthcare Providers:  GravelBags.it This test is not yet ap proved or cleared by the [place] FDA and  has been authorized for detection  and/or diagnosis of SARS-CoV-2 by FDA under an Emergency Use Authorization (EUA). This EUA will remain  in effect (meaning this test can be used) for the duration of the COVID-19 declaration under Section 564(b)(1) of the Act, 21 U.S.C. section 360bbb-3(b)(1), unless the authorization is terminated or revoked sooner.    Influenza A by PCR NEGATIVE NEGATIVE Final   Influenza B by PCR NEGATIVE NEGATIVE Final    Comment: (NOTE) The Xpert Xpress SARS-CoV-2/FLU/RSV assay is intended as an aid in  the diagnosis of influenza from Nasopharyngeal swab specimens and  should not be used as a sole basis for treatment. Nasal washings and  aspirates are unacceptable for Xpert Xpress SARS-CoV-2/FLU/RSV  testing. Fact Sheet for Patients: PinkCheek.be Fact Sheet for Healthcare Providers: GravelBags.it This test is not yet approved or cleared by the [place] FDA and  has been authorized for detection and/or diagnosis of SARS-CoV-2 by  FDA under an Emergency  Use Authorization (EUA). This EUA will remain  in effect (meaning this test can be used) for the duration of the  Covid-19 declaration under Section 564(b)(1) of the Act, 21  U.S.C. section 360bbb-3(b)(1), unless the authorization is  terminated or revoked. Performed at [facility], [place] [address], [place], [place] [address]      Radiology Studies: No results found.  Scheduled Meds: . heparin  5,000 Units Subcutaneous Q8H  . lipase/protease/amylase  36,000 Units Oral BID WC   Continuous Infusions: . dextrose 5 % and 0.9% NaCl 125 mL/hr at 08/23/19 1152     LOS: 2 days   Marylu Lund, MD [facility] Pager On Amion  If 7PM-7AM, please contact night-coverage 08/23/2019, 4:20 PM

## 2019-08-24 LAB — COMPREHENSIVE METABOLIC PANEL
ALT: 37 U/L (ref 0–44)
AST: 55 U/L — ABNORMAL HIGH (ref 15–41)
Albumin: 3 g/dL — ABNORMAL LOW (ref 3.5–5.0)
Alkaline Phosphatase: 74 U/L (ref 38–126)
Anion gap: 12 (ref 5–15)
BUN: <5 mg/dL — ABNORMAL LOW (ref 6–20)
CO2: 25 mmol/L (ref 22–32)
Calcium: 9 mg/dL (ref 8.9–10.3)
Chloride: 101 mmol/L (ref 98–111)
Creatinine, Ser: 1.09 mg/dL (ref 0.61–1.24)
GFR calc Af Amer: >60 mL/min (ref >60)
GFR calc non Af Amer: >60 mL/min (ref >60)
Glucose, Bld: 110 mg/dL — ABNORMAL HIGH (ref 70–99)
Potassium: 3.6 mmol/L (ref 3.5–5.1)
Sodium: 138 mmol/L (ref 135–145)
Total Bilirubin: 0.6 mg/dL (ref 0.3–1.2)
Total Protein: 6.6 g/dL (ref 6.5–8.1)

## 2019-08-24 LAB — LIPASE, BLOOD: Lipase: 84 U/L — ABNORMAL HIGH (ref 11–51)

## 2019-08-24 LAB — GLUCOSE, CAPILLARY
Glucose-Capillary: 105 mg/dL — ABNORMAL HIGH (ref 70–99)
Glucose-Capillary: 106 mg/dL — ABNORMAL HIGH (ref 70–99)
Glucose-Capillary: 108 mg/dL — ABNORMAL HIGH (ref 70–99)
Glucose-Capillary: 113 mg/dL — ABNORMAL HIGH (ref 70–99)
Glucose-Capillary: 131 mg/dL — ABNORMAL HIGH (ref 70–99)

## 2019-08-24 MED ORDER — POTASSIUM CHLORIDE CRYS ER 20 MEQ PO TBCR
40.0000 meq | EXTENDED_RELEASE_TABLET | Freq: Once | ORAL | Status: AC
  Administered 2019-08-24: 40 meq via ORAL
  Filled 2019-08-24: qty 2

## 2019-08-24 NOTE — Progress Notes (Signed)
PROGRESS NOTE    Kyle Berry  [id number] DOB: 04/25/1970 DOA: 08/20/2019 PCP: [name] (Inactive)    Brief Narrative:  49 y.o. 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  Unable to keep anything pain worsening.  Denies any shortness of breath or chest pain had some subjective feeling of fever chills.  Assessment & Plan:   Principal Problem:   Acute pancreatitis Active Problems:   Essential hypertension   Congenital single kidney  1. Acute on chronic pancreatitis  1. Unclear etiology 2. No recent ETOH hx reported, TG reviewed and unremarkable 3. Pt is s/p cholecystectomy 4. Cont IVF hydration and analgesia for now 5. Still having epigastric pains limiting his ability to advance diet 6. Lipase up to 84 7. Continued on clears as tolerated today. Pt to advance as he can tolerate 8. Currently on home Creon with advancing diet 9. Given continued symptoms and inability to advance diet thus far, will consult Dietitian for nutritional assessment 2. Acute renal failure with hypokalemia  1. likely from dehydration from vomiting and diarrhea.   2. Potassium stable and normal this AM 3. Repeat bmet in AM 3. Hypertensive urgency  1. likely from not able to take his home medication with creasing pain also contributing.   2. Continued on PRN IV hydralazine as needed. 4. Elevated LFTs  1. Trended down 2. Suspect related to presenting acute on chronic pancreatitis flare 3. Follow LFT trends in AM  DVT prophylaxis: Heparin subq Code Status: Full Family Communication: Pt in room, family not at bedside  Status is: Inpatient  Remains inpatient  appropriate because:Ongoing active pain requiring inpatient pain management, IV treatments appropriate due to intensity of illness or inability to take PO and Inpatient level of care appropriate due to severity of illness   Dispo: The patient is from: Home              Anticipated d/c is to: Home              Anticipated d/c date is: > 3 days              Patient currently is not medically stable to d/c.   Consultants:     Procedures:     Antimicrobials: Anti-infectives (From admission, onward)   None      Subjective: Still having epigastric discomfort with clears  Objective: Vitals:   08/23/19 0447 08/23/19 1501 08/23/19 2108 08/24/19 0425  BP: (!) 152/93 (!) 132/96 137/89 (!) 134/95  Pulse: 74 76 71 70  Resp: 17 16 17 17   Temp: 98.4 F (36.9 C) 98.2 F (36.8 C) 98.3 F (36.8 C) 98.7 F (37.1 C)  TempSrc: Oral Oral Oral Oral  SpO2: 94% 98% 97% 97%  Weight:      Height:       No intake or output data in the 24 hours ending 08/24/19 1536 Filed Weights   08/21/19 0648  Weight: 79.3 kg    Examination: General exam: Awake, laying in bed, in nad Respiratory system: Normal respiratory effort, no wheezing Cardiovascular system: regular rate, s1, s2 Gastrointestinal system: Soft, nondistended, positive BS Central nervous system: CN2-12 grossly intact, strength intact Extremities: Perfused,  no clubbing Skin: Normal skin turgor, no notable skin lesions seen Psychiatry: Mood normal // no visual hallucinations   Data Reviewed: I have personally reviewed following labs and imaging studies  CBC: Recent Labs  Lab 08/18/19 2020 08/20/19 1520 08/21/19 0639  WBC 6.8 8.8 9.5  NEUTROABS 3.7  --  8.2*  HGB 12.6* 13.2 12.2*  HCT 37.0* 40.6 37.3*  MCV 99.2 100.5* 100.8*  PLT 181 182 [id number]   Basic Metabolic Panel: Recent Labs  Lab 08/20/19 1520 08/21/19 0639 08/22/19 0447 08/23/19 0153 08/24/19 0308  NA 136 138 132* 136 138  K 3.4* 4.0 3.8 3.8 3.6  CL 101 104  96* 99 101  CO2 23 21* 24 27 25   GLUCOSE 133* 113* 87 99 110*  BUN 5* <5* <5* <5* <5*  CREATININE 1.30* 1.06 1.09 1.09 1.09  CALCIUM 9.1 8.2* 8.5* 8.8* 9.0   GFR: Estimated Creatinine Clearance: 92 mL/min (by C-G formula based on SCr of 1.09 mg/dL). Liver Function Tests: Recent Labs  Lab 08/20/19 1520 08/21/19 0639 08/22/19 0447 08/23/19 0153 08/24/19 0308  AST 89* 62* 42* 35 55*  ALT 57* 45* 32 30 37  ALKPHOS 76 71 58 59 74  BILITOT 1.3* 1.3* 1.0 1.0 0.6  PROT 7.1 6.6 6.1* 6.0* 6.6  ALBUMIN 3.7 3.1* 2.8* 2.6* 3.0*   Recent Labs  Lab 08/20/19 1520 08/22/19 0447 08/23/19 0153 08/24/19 0308  LIPASE 339* 104* 68* 84*   No results for input(s): AMMONIA in the last 168 hours. Coagulation Profile: No results for input(s): INR, PROTIME in the last 168 hours. Cardiac Enzymes: No results for input(s): CKTOTAL, CKMB, CKMBINDEX, TROPONINI in the last 168 hours. BNP (last 3 results) No results for input(s): PROBNP in the last 8760 hours. HbA1C: No results for input(s): HGBA1C in the last 72 hours. CBG: Recent Labs  Lab 08/23/19 0824 08/23/19 1644 08/24/19 0002 08/24/19 0754 08/24/19 1210  GLUCAP 100* 126* 106* 113* 108*   Lipid Profile: No results for input(s): CHOL, HDL, LDLCALC, TRIG, CHOLHDL, LDLDIRECT in the last 72 hours. Thyroid Function Tests: No results for input(s): TSH, T4TOTAL, FREET4, T3FREE, THYROIDAB in the last 72 hours. Anemia Panel: No results for input(s): VITAMINB12, FOLATE, FERRITIN, TIBC, IRON, RETICCTPCT in the last 72 hours. Sepsis Labs: No results for input(s): PROCALCITON, LATICACIDVEN in the last 168 hours.  Recent Results (from the past 240 hour(s))  Respiratory Panel by RT PCR (Flu A&B, Covid) - Nasopharyngeal Swab     Status: None   Collection Time: 08/21/19  5:39 AM   Specimen: Nasopharyngeal Swab  Result Value Ref Range Status   SARS Coronavirus 2 by RT PCR NEGATIVE NEGATIVE Final    Comment: (NOTE) SARS-CoV-2 target nucleic acids  are NOT DETECTED. The SARS-CoV-2 RNA is generally detectable in upper respiratoy specimens during the acute phase of infection. The lowest concentration of SARS-CoV-2 viral copies this assay can detect is 131 copies/mL. A negative result does not preclude SARS-Cov-2 infection and should not be used as the sole basis for treatment or other patient management decisions. A negative result may occur with  improper specimen collection/handling, submission of specimen other than nasopharyngeal swab, presence of viral mutation(s) within the areas targeted by this assay, and inadequate number of viral copies (<131 copies/mL). A negative result must be combined with clinical observations, patient history, and epidemiological information. The expected result is Negative. Fact Sheet for Patients:  PinkCheek.be Fact Sheet for Healthcare Providers:  GravelBags.it This test is not yet ap proved or cleared  by the [place] and  has been authorized for detection and/or diagnosis of SARS-CoV-2 by FDA under an Emergency Use Authorization (EUA). This EUA will remain  in effect (meaning this test can be used) for the duration of the COVID-19 declaration under Section 564(b)(1) of the Act, 21 U.S.C. section 360bbb-3(b)(1), unless the authorization is terminated or revoked sooner.    Influenza A by PCR NEGATIVE NEGATIVE Final   Influenza B by PCR NEGATIVE NEGATIVE Final    Comment: (NOTE) The Xpert Xpress SARS-CoV-2/FLU/RSV assay is intended as an aid in  the diagnosis of influenza from Nasopharyngeal swab specimens and  should not be used as a sole basis for treatment. Nasal washings and  aspirates are unacceptable for Xpert Xpress SARS-CoV-2/FLU/RSV  testing. Fact Sheet for Patients: PinkCheek.be Fact Sheet for Healthcare Providers: GravelBags.it This test is not yet approved or  cleared by the [place] FDA and  has been authorized for detection and/or diagnosis of SARS-CoV-2 by  FDA under an Emergency Use Authorization (EUA). This EUA will remain  in effect (meaning this test can be used) for the duration of the  Covid-19 declaration under Section 564(b)(1) of the Act, 21  U.S.C. section 360bbb-3(b)(1), unless the authorization is  terminated or revoked. Performed at [facility], [place] [address] [place], [place] [address]      Radiology Studies: No results found.  Scheduled Meds: . heparin  5,000 Units Subcutaneous Q8H  . lipase/protease/amylase  36,000 Units Oral BID WC   Continuous Infusions: . dextrose 5 % and 0.9% NaCl 125 mL/hr at 08/23/19 1152     LOS: 3 days   Marylu Lund, MD [facility] Pager On Amion  If 7PM-7AM, please contact night-coverage 08/24/2019, 3:36 PM

## 2019-08-24 NOTE — Plan of Care (Signed)
  Problem: Education: Goal: Knowledge of General Education information will improve Description Including pain rating scale, medication(s)/side effects and non-pharmacologic comfort measures Outcome: Progressing   

## 2019-08-25 DIAGNOSIS — G8929 Other chronic pain: Secondary | ICD-10-CM

## 2019-08-25 DIAGNOSIS — R1013 Epigastric pain: Secondary | ICD-10-CM

## 2019-08-25 LAB — GLUCOSE, CAPILLARY
Glucose-Capillary: 107 mg/dL — ABNORMAL HIGH (ref 70–99)
Glucose-Capillary: 146 mg/dL — ABNORMAL HIGH (ref 70–99)

## 2019-08-25 LAB — COMPREHENSIVE METABOLIC PANEL
ALT: 40 U/L (ref 0–44)
AST: 52 U/L — ABNORMAL HIGH (ref 15–41)
Albumin: 2.8 g/dL — ABNORMAL LOW (ref 3.5–5.0)
Alkaline Phosphatase: 70 U/L (ref 38–126)
Anion gap: 5 (ref 5–15)
BUN: <5 mg/dL — ABNORMAL LOW (ref 6–20)
CO2: 27 mmol/L (ref 22–32)
Calcium: 8.8 mg/dL — ABNORMAL LOW (ref 8.9–10.3)
Chloride: 106 mmol/L (ref 98–111)
Creatinine, Ser: 1.17 mg/dL (ref 0.61–1.24)
GFR calc Af Amer: >60 mL/min (ref >60)
GFR calc non Af Amer: >60 mL/min (ref >60)
Glucose, Bld: 110 mg/dL — ABNORMAL HIGH (ref 70–99)
Potassium: 4 mmol/L (ref 3.5–5.1)
Sodium: 138 mmol/L (ref 135–145)
Total Bilirubin: 0.3 mg/dL (ref 0.3–1.2)
Total Protein: 6.2 g/dL — ABNORMAL LOW (ref 6.5–8.1)

## 2019-08-25 LAB — LIPASE, BLOOD: Lipase: 97 U/L — ABNORMAL HIGH (ref 11–51)

## 2019-08-25 NOTE — Plan of Care (Signed)
  Problem: Education: Goal: Knowledge of General Education information will improve Description Including pain rating scale, medication(s)/side effects and non-pharmacologic comfort measures Outcome: Progressing   

## 2019-08-25 NOTE — Progress Notes (Signed)
Initial Nutrition Assessment  DOCUMENTATION CODES:   Not applicable  INTERVENTION:   -RD will follow for diet advancement and supplement as appropriate -If prolonged NPO status is anticipated, consider initiation of nutrition support  NUTRITION DIAGNOSIS:   Increased nutrient needs related to chronic illness(pancreatitis) as evidenced by estimated needs.  GOAL:   Patient will meet greater than or equal to 90% of their needs  MONITOR:   Diet advancement, Labs, Weight trends, Skin, I & O's  REASON FOR ASSESSMENT:   Consult Assessment of nutrition requirement/status  ASSESSMENT:   Kyle Berry is a 49 y.o. 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  Unable to keep anything pain worsening.  Denies any shortness of breath or chest pain had some subjective feeling of fever chills.  Pt admitted with pancreatitis.   Reviewed I/O's: +6.5 L x 24 hours and +16.3 L since admission  Spoke with pt at bedside, who reports not feeling well at time of visit, complaining of nausea. Pt was advanced to solid foods yesterday. Pt shares he was doing well up until this morning, when he developed nausea after eating eggs and grits (pt consumed 100% of breakfast tray). Per pt, he tolerated liquids yesterday without difficulty.   Per pt, he typically has a good appetite, consuming 2 meals (meat, starch, and vegetable) and two snacks (fruit or potato chips) daily. Per pt, he rarely eats out and he and his girlfriend prepare meals at home. Pt has been unable to keep down solid food for 3 days PTA, but has been consistently able to tolerate liquids.   Pt thinks he has lost weight  (approximately 5 pounds) over the past week, due to decreased oral intake. Pt reports UBW is around 175#. Per wt hx, wt has been stable over the past year.   RD discussed ways to combat nausea, including choosing cold foods to help minimize odors. Pt amenable to try oral nutrition supplements, as he consumes these at home. Due to nausea, pt diet downgraded to NPO after RD visit.   Labs reviewed: CBGS: 105-131.   NUTRITION - FOCUSED PHYSICAL EXAM:    Most Recent Value  Orbital Region  No depletion  Upper Arm Region  No depletion  Thoracic and Lumbar Region  No depletion  Buccal Region  No depletion  Temple Region  No depletion  Clavicle Bone Region  No depletion  Clavicle and Acromion Bone Region  No depletion  Scapular Bone Region  No depletion  Dorsal Hand  No depletion  Patellar Region  No depletion  Anterior Thigh Region  No depletion  Posterior Calf Region  No depletion  Edema (RD Assessment)  None  Hair  Reviewed  Eyes  Reviewed  Mouth  Reviewed  Skin  Reviewed  Nails  Reviewed       Diet Order:   Diet Order            Diet NPO time specified Except for: Ice Chips, Sips with Meds  Diet effective now              EDUCATION NEEDS:   Education needs have been addressed  Skin:  Skin Assessment: Reviewed RN Assessment  Last BM:  08/23/19  Height:   Ht Readings from Last 1 Encounters:  08/21/19 6\' 1"  (1.854 m)    Weight:   Wt Readings from Last 1 Encounters:  08/21/19 79.3 kg    Ideal Body Weight:  83.6 kg  BMI:  Body mass index is 23.07 kg/m.  Estimated Nutritional Needs:   Kcal:  2300-2500  Protein:  120-135 grams  Fluid:  > 2.3 L    Loistine Chance, RD, LDN, [place] Registered Dietitian II Certified Diabetes Care and Education Specialist Please refer to [facility] for RD and/or RD on-call/weekend/after hours pager

## 2019-08-25 NOTE — Progress Notes (Signed)
PROGRESS NOTE    Kyle Berry  [id number] DOB: December 24, 1970 DOA: 08/20/2019 PCP: [name] (Inactive)    Brief Narrative:  49 y.o. 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  Unable to keep anything pain worsening.  Denies any shortness of breath or chest pain had some subjective feeling of fever chills.  Assessment & Plan:   Principal Problem:   Acute pancreatitis Active Problems:   Essential hypertension   Congenital single kidney  1. Acute on chronic pancreatitis  1. Unclear etiology 2. No recent ETOH hx reported, TG reviewed and unremarkable 3. Pt is s/p cholecystectomy 4. Cont IVF hydration and analgesia for now 5. Still having epigastric pains limiting his ability to advance diet 6. Lipase trending up 7. Initially improving, however symptoms worsening again 8. Have since consulted GI. Appreciate input. Recommendation to again downgrade diet for now and allow bowel rest. Ultimately will need to advance more slowly 9. Dietitian consulted for nutritional assesment 2. Acute renal failure with hypokalemia  1. likely from dehydration from vomiting and diarrhea.   2. Potassium remains stable and normalized 3. Recheck bmet in AM 3. Hypertensive urgency  1. likely from not able to take his home medication with creasing pain also contributing.   2. Continued on PRN IV hydralazine as needed. 3. BP trends are thus far stable 4. Elevated LFTs  1. Trended down 2. Suspect related to presenting acute on chronic pancreatitis flare  DVT prophylaxis: Heparin subq Code Status: Full Family Communication: Pt in room, family not at bedside  Status is:  Inpatient  Remains inpatient appropriate because:Ongoing active pain requiring inpatient pain management, IV treatments appropriate due to intensity of illness or inability to take PO and Inpatient level of care appropriate due to severity of illness   Dispo: The patient is from: Home              Anticipated d/c is to: Home              Anticipated d/c date is: > 3 days              Patient currently is not medically stable to d/c.   Consultants:   GI  Procedures:     Antimicrobials: Anti-infectives (From admission, onward)   None      Subjective: Did not tolerate soft diet. Complaining of worse abd discomfort last night and into this AM  Objective: Vitals:   08/24/19 1616 08/24/19 2051 08/25/19 0550 08/25/19 1435  BP: (!) 144/93 (!) 155/97 (!) 157/94 (!) 141/89  Pulse: 66 66 65 60  Resp: 18 18 18 18   Temp: 97.7 F (36.5 C) (!) 97.4 F (36.3 C) 97.6 F (36.4 C) 98.4 F (36.9 C)  TempSrc: Oral Oral Oral Oral  SpO2: 97% 100% 99% 97%  Weight:      Height:        Intake/Output Summary (Last 24 hours) at 08/25/2019 1646 Last data filed at 08/25/2019 1300 Gross per 24 hour  Intake 5232.19 ml  Output --  Net 5232.19 ml   Filed Weights   08/21/19 0648  Weight: 79.3 kg    Examination: General exam: Conversant, in no acute distress  Respiratory system: normal chest rise, clear, no audible wheezing Cardiovascular system: regular rhythm, s1-s2 Gastrointestinal system: Nondistended, generally tender, pos BS Central nervous system: No seizures, no tremors Extremities: No cyanosis, no joint deformities Skin: No rashes, no pallor Psychiatry: Affect normal // no auditory hallucinations   Data Reviewed: I have personally reviewed following labs and imaging studies  CBC: Recent Labs  Lab 08/18/19 2020 08/20/19 1520 08/21/19 0639  WBC 6.8 8.8 9.5  NEUTROABS 3.7  --  8.2*  HGB 12.6* 13.2 12.2*  HCT 37.0* 40.6 37.3*  MCV 99.2 100.5* 100.8*  PLT 181 182 [id number]    Basic Metabolic Panel: Recent Labs  Lab 08/21/19 0639 08/22/19 0447 08/23/19 0153 08/24/19 0308 08/25/19 0253  NA 138 132* 136 138 138  K 4.0 3.8 3.8 3.6 4.0  CL 104 96* 99 101 106  CO2 21* 24 27 25 27   GLUCOSE 113* 87 99 110* 110*  BUN <5* <5* <5* <5* <5*  CREATININE 1.06 1.09 1.09 1.09 1.17  CALCIUM 8.2* 8.5* 8.8* 9.0 8.8*   GFR: Estimated Creatinine Clearance: 85.7 mL/min (by C-G formula based on SCr of 1.17 mg/dL). Liver Function Tests: Recent Labs  Lab 08/21/19 0639 08/22/19 0447 08/23/19 0153 08/24/19 0308 08/25/19 0253  AST 62* 42* 35 55* 52*  ALT 45* 32 30 37 40  ALKPHOS 71 58 59 74 70  BILITOT 1.3* 1.0 1.0 0.6 0.3  PROT 6.6 6.1* 6.0* 6.6 6.2*  ALBUMIN 3.1* 2.8* 2.6* 3.0* 2.8*   Recent Labs  Lab 08/20/19 1520 08/22/19 0447 08/23/19 0153 08/24/19 0308 08/25/19 0253  LIPASE 339* 104* 68* 84* 97*   No results for input(s): AMMONIA in the last 168 hours. Coagulation Profile: No results for input(s): INR, PROTIME in the last 168 hours. Cardiac Enzymes: No results for input(s): CKTOTAL, CKMB, CKMBINDEX, TROPONINI in the last 168 hours. BNP (last 3 results) No results for input(s): PROBNP in the last 8760 hours. HbA1C: No results for input(s): HGBA1C in the last 72 hours. CBG: Recent Labs  Lab 08/24/19 0754 08/24/19 1210 08/24/19 1613 08/24/19 2351 08/25/19 0731  GLUCAP 113* 108* 131* 105* 107*   Lipid Profile: No results for input(s): CHOL, HDL, LDLCALC, TRIG, CHOLHDL, LDLDIRECT in the last 72 hours. Thyroid Function Tests: No results for input(s): TSH, T4TOTAL, FREET4, T3FREE, THYROIDAB in the last 72 hours. Anemia Panel: No results for input(s): VITAMINB12, FOLATE, FERRITIN, TIBC, IRON, RETICCTPCT in the last 72 hours. Sepsis Labs: No results for input(s): PROCALCITON, LATICACIDVEN in the last 168 hours.  Recent Results (from the past 240 hour(s))  Respiratory Panel by RT PCR (Flu A&B, Covid) - Nasopharyngeal Swab     Status: None    Collection Time: 08/21/19  5:39 AM   Specimen: Nasopharyngeal Swab  Result Value Ref Range Status   SARS Coronavirus 2 by RT PCR NEGATIVE NEGATIVE Final    Comment: (NOTE) SARS-CoV-2 target nucleic acids are NOT DETECTED. The SARS-CoV-2 RNA is generally detectable in upper respiratoy specimens during the acute phase of infection. The lowest concentration of SARS-CoV-2 viral copies this assay can detect is 131 copies/mL. A negative result does not preclude SARS-Cov-2 infection and should not be used as the sole basis for treatment or other patient management decisions. A negative result may occur with  improper specimen collection/handling, submission of specimen other than nasopharyngeal swab, presence of viral mutation(s) within the areas targeted by this assay, and inadequate number of viral copies (<131 copies/mL). A negative result must be combined with clinical observations, patient  history, and epidemiological information. The expected result is Negative. Fact Sheet for Patients:  PinkCheek.be Fact Sheet for Healthcare Providers:  GravelBags.it This test is not yet ap proved or cleared by the [place] FDA and  has been authorized for detection and/or diagnosis of SARS-CoV-2 by FDA under an Emergency Use Authorization (EUA). This EUA will remain  in effect (meaning this test can be used) for the duration of the COVID-19 declaration under Section 564(b)(1) of the Act, 21 U.S.C. section 360bbb-3(b)(1), unless the authorization is terminated or revoked sooner.    Influenza A by PCR NEGATIVE NEGATIVE Final   Influenza B by PCR NEGATIVE NEGATIVE Final    Comment: (NOTE) The Xpert Xpress SARS-CoV-2/FLU/RSV assay is intended as an aid in  the diagnosis of influenza from Nasopharyngeal swab specimens and  should not be used as a sole basis for treatment. Nasal washings and  aspirates are unacceptable for Xpert Xpress  SARS-CoV-2/FLU/RSV  testing. Fact Sheet for Patients: PinkCheek.be Fact Sheet for Healthcare Providers: GravelBags.it This test is not yet approved or cleared by the [place] FDA and  has been authorized for detection and/or diagnosis of SARS-CoV-2 by  FDA under an Emergency Use Authorization (EUA). This EUA will remain  in effect (meaning this test can be used) for the duration of the  Covid-19 declaration under Section 564(b)(1) of the Act, 21  U.S.C. section 360bbb-3(b)(1), unless the authorization is  terminated or revoked. Performed at [facility], [place] [address], [place], [place] [address]      Radiology Studies: No results found.  Scheduled Meds: . heparin  5,000 Units Subcutaneous Q8H  . lipase/protease/amylase  36,000 Units Oral BID WC   Continuous Infusions: . dextrose 5 % and 0.9% NaCl 125 mL/hr at 08/25/19 1357     LOS: 4 days   Marylu Lund, MD [facility] Pager On Amion  If 7PM-7AM, please contact night-coverage 08/25/2019, 4:46 PM

## 2019-08-25 NOTE — Consult Note (Addendum)
[facility] Consult: 11:59 AM 08/25/2019  LOS: 4 days    Referring Provider: Dr. Wyline Copas Primary Care Physician:  [name] (Inactive) Primary Gastroenterologist:  Dr. Rush Landmark, Silverio Decamp.    Reason for Consultation: Recurrent acute, idiopathic pancreatitis   HPI: Kyle Berry is a 49 y.o. male.  Recurrent bouts with acute pancreatitis and several hospitalizations.  Initially, 08/2015,was felt to be alcohol related.  However he suffered recurrences even despite abstinence.  Another possible etiology was ACE inhibitor and PPI, both discontinued.  IgG4 normal, ruling out autoimmune pancreatitis.  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  On supplemental pancreatic enzymes. 10/2016 MRCP showed acute pancreatitis, suspicion for pancreas divisum, no evidence of chronic pancreatitis. 08/2016 MRI/MRCP raised question of chronic pancreatitis with focally dilated portion of dorsal pancreatic duct and fused mild irregularity of pancreatic duct.  Besides the current admission, his most recent hospitalization for recurrent pancreatitis was in mid 05/2019.  Presented to the ED 1 week ago with seizure.  He reported drinking alcohol only rarely.  He was discharged home with nonfocal neuro exam, return to baseline and unremarkable head CT and labs. Represented to the ED 5 days ago with abdominal pain for couple of days and ruled in for recurrent pancreatitis.  Pain in the epigastrium and radiated to the back a/w nausea and vomiting, diarrhea.   Lipase 339.  T bili 1.3.  Alk phos normal.  AST/ALT 89/57. Lipase declined to 68 on 5/9 but today it is back to 97.  AST/ALT 52/40.  Normal T bili and alk phos.  08/20/2019 CTAP with contrast.  Acute  pancreatitis without abscess or pseudocyst.  Calcification in the uncinate probably sequela from previous pancreatitis.  Fatty liver.  Normal diameter bile ducts.  Accompanying the pancreatitis is AKI, hypertensive urgency, hypokalemia.  He was started on clear liquids 5/9, which caused a little bit of epigastric pain but he tolerated.  Yesterday he was started on soft diet and this led to more pain, abdominal distention and vomiting.  This morning diet was regressed back to ice chips because of his symptoms and since the lipase climbed. Small bowel movement this morning, brown in color.  Patient says that after resolution of pancreatitis, he is pain-free and tolerates most foods.  He has had a couple of episodes of mild nausea and abdominal pain which he self treats with clear liquid diet and the symptoms resolved without need for medical attention.  Maternal grandmother had pancreatitis, GB removed.  At time of cholecystectomy widespread mets of unclear primary diagnosed, eventually she passed.    Past Medical History:  Diagnosis Date  . Acute pancreatitis 11/23/2015  . Chronic bronchitis ([place])   . Chronic lower back pain   . Congenital single kidney   . GERD (gastroesophageal reflux disease)   . Headache    "once/month" (05/13/2017)  . High cholesterol   . History of kidney stones   . Hypertension   . Migraine    "a couple/year" (05/13/2017)  . Nephrolithiasis 05/13/2017  . Pneumonia ~  09/2015  . Recurrent acute pancreatitis   . Renal disorder     Past Surgical History:  Procedure Laterality Date  . CHOLECYSTECTOMY N/A 10/23/2017   Procedure: LAPAROSCOPIC CHOLECYSTECTOMY WITH INTRAOPERATIVE CHOLANGIOGRAM;  Surgeon: Johnathan Hausen, MD;  Location: [facility];  Service: General;  Laterality: N/A;  . NO PAST SURGERIES      Prior to Admission medications   Medication Sig Start Date End Date Taking? Authorizing Provider  acetaminophen (TYLENOL) 325 MG tablet Take 1-2 tablets by mouth 3  (three) times daily as needed for moderate pain or headache. 08/19/19  Yes [provider]  diphenhydrAMINE (BENADRYL) 25 MG tablet Take 25 mg by mouth daily as needed for itching or allergies.   Yes [provider]  ibuprofen (ADVIL) 800 MG tablet Take 1 tablet (800 mg total) by mouth every 8 (eight) hours as needed. 03/12/19  Yes Fransico Meadow, PA-C  lipase/protease/amylase (CREON) 36000 UNITS CPEP capsule Take 1 capsule (36,000 Units total) by mouth See admin instructions. Take one capsules (36,000 units) by mouth with meals (2 meals daily) and one capsule (36000 units) with snacks daily Patient taking differently: Take 36,000 Units by mouth See admin instructions. Take 1 capsule by mouth twice daily with meals and 1 capsule with snacks daily 10/14/17  Yes Emokpae, [name], MD  ondansetron (ZOFRAN ODT) 4 MG disintegrating tablet Take 1 tablet (4 mg total) by mouth every 8 (eight) hours as needed for nausea or vomiting. 03/12/19  Yes Fransico Meadow, PA-C  oxyCODONE-acetaminophen (PERCOCET/ROXICET) 5-325 MG tablet Take 1 tablet by mouth every 6 (six) hours as needed for moderate pain. 06/03/19  Yes Mikhail, Maryann, DO  telmisartan (MICARDIS) 40 MG tablet Take 40 mg by mouth daily. 03/05/19  Yes [provider]  tetrahydrozoline 0.05 % ophthalmic solution Place 1 drop into both eyes daily as needed (Dry eyes).   Yes [provider]    Scheduled Meds: . heparin  5,000 Units Subcutaneous Q8H  . lipase/protease/amylase  36,000 Units Oral BID WC   Infusions: . dextrose 5 % and 0.9% NaCl 125 mL/hr at 08/25/19 0600   PRN Meds: hydrALAZINE, HYDROmorphone (DILAUDID) injection, labetalol, lipase/protease/amylase, ondansetron **OR** ondansetron (ZOFRAN) IV, prochlorperazine   Allergies as of 08/20/2019 - Review Complete 08/20/2019  Allergen Reaction Noted  . Diclofenac Hives 12/14/2013  . Norvasc [amlodipine besylate] Other (See Comments) 08/15/2016  . Omeprazole  Other (See Comments) 08/15/2016  . Prednisone Other (See Comments) 12/14/2013    Family History  Problem Relation Age of Onset  . Hypertension Other   . Hypertension Mother   . Hypertension Father   . Kidney disease Father   . Hypertension Sister   . Diabetes Sister   . Hypertension Brother     Social History   Socioeconomic History  . Marital status: Single    Spouse name: Not on file  . Number of children: Not on file  . Years of education: Not on file  . Highest education level: Not on file  Occupational History  . Not on file  Tobacco Use  . Smoking status: Never Smoker  . Smokeless tobacco: Never Used  Substance and Sexual Activity  . Alcohol use: Not Currently    Comment: 05/13/2017 "might have beer a few times/year"  . Drug use: No  . Sexual activity: Yes    Birth control/protection: Condom  Other Topics Concern  . Not on file  Social History Narrative  . Not on file   Social Determinants of Health  Financial Resource Strain:   . Difficulty of Paying Living Expenses:   Food Insecurity:   . Worried About Charity fundraiser in the Last Year:   . Arboriculturist in the Last Year:   Transportation Needs:   . Film/video editor (Medical):   Marland Kitchen Lack of Transportation (Non-Medical):   Physical Activity:   . Days of Exercise per Week:   . Minutes of Exercise per Session:   Stress:   . Feeling of Stress :   Social Connections:   . Frequency of Communication with Friends and Family:   . Frequency of Social Gatherings with Friends and Family:   . Attends Religious Services:   . Active Member of Clubs or Organizations:   . Attends Archivist Meetings:   Marland Kitchen Marital Status:   Intimate Partner Violence:   . Fear of Current or Ex-Partner:   . Emotionally Abused:   Marland Kitchen Physically Abused:   . Sexually Abused:     REVIEW OF SYSTEMS: Constitutional: No weakness, no fatigue. ENT:  No nose bleeds Pulm: No difficulty breathing. CV:  No palpitations,  no LE edema.  No angina GU:  No hematuria, no frequency GI: See HPI. Heme: Denies unusual or excessive bleeding or bruising. Transfusions: None. Neuro:  No headaches, no peripheral tingling or numbness.  No syncope or seizures. Derm:  No itching, no rash or sores.  Endocrine:  No sweats or chills.  No polyuria or dysuria Immunization: Not queried. Travel:  None beyond local counties in last few months.    PHYSICAL EXAM: Vital signs in last 24 hours: Vitals:   08/24/19 2051 08/25/19 0550  BP: (!) 155/97 (!) 157/94  Pulse: 66 65  Resp: 18 18  Temp: (!) 97.4 F (36.3 C) 97.6 F (36.4 C)  SpO2: 100% 99%   Wt Readings from Last 3 Encounters:  08/21/19 79.3 kg  08/18/19 79.4 kg  05/29/19 77.6 kg    General: Pleasant, looks well.  Comfortable. Head: No facial asymmetry or swelling.  No signs of head trauma. Eyes: No conjunctival pallor.  No scleral icterus. Ears: Not hard of hearing Nose: No discharge or congestion Mouth: Moist, pink, clear mucosa.  Tongue midline.  Good dentition. Neck: No JVD, no masses, no thyromegaly. Lungs: Lungs clear bilaterally.  No labored breathing, no cough. Heart: RRR.  No MRG.  S1, S2 present Abdomen: Soft without distention.  Minimal epigastric tenderness without guarding or rebound.  Active bowel sounds.  No HSM, masses, bruits. Rectal: Deferred. Musc/Skeltl: No joint redness, swelling or gross deformity. Extremities: No CCE. Neurologic: Oriented x3.  No gross deficits, weakness, tremors.  Good historian. Skin: No rash, sores, telangiectasia Nodes: No cervical adenopathy Psych: Calm, pleasant, normal affect.  Fluid speech.  Cooperative.  Intake/Output from previous day: 05/10 0701 - 05/11 0700 In: 6482.2 [P.O.:1730; I.V.:4752.2] Out: -  Intake/Output this shift: No intake/output data recorded.  LAB RESULTS: No results for input(s): WBC, HGB, HCT, PLT in the last 72 hours. BMET Lab Results  Component Value Date   NA 138 08/25/2019    NA 138 08/24/2019   NA 136 08/23/2019   K 4.0 08/25/2019   K 3.6 08/24/2019   K 3.8 08/23/2019   CL 106 08/25/2019   CL 101 08/24/2019   CL 99 08/23/2019   CO2 27 08/25/2019   CO2 25 08/24/2019   CO2 27 08/23/2019   GLUCOSE 110 (H) 08/25/2019   GLUCOSE 110 (H) 08/24/2019   GLUCOSE 99 08/23/2019  BUN <5 (L) 08/25/2019   BUN <5 (L) 08/24/2019   BUN <5 (L) 08/23/2019   CREATININE 1.17 08/25/2019   CREATININE 1.09 08/24/2019   CREATININE 1.09 08/23/2019   CALCIUM 8.8 (L) 08/25/2019   CALCIUM 9.0 08/24/2019   CALCIUM 8.8 (L) 08/23/2019   LFT Recent Labs    08/23/19 0153 08/24/19 0308 08/25/19 0253  PROT 6.0* 6.6 6.2*  ALBUMIN 2.6* 3.0* 2.8*  AST 35 55* 52*  ALT 30 37 40  ALKPHOS 59 74 70  BILITOT 1.0 0.6 0.3   PT/INR Lab Results  Component Value Date   INR 1.11 10/19/2017   Hepatitis Panel No results for input(s): HEPBSAG, HCVAB, HEPAIGM, HEPBIGM in the last 72 hours. C-Diff No components found for: CDIFF Lipase     Component Value Date/Time   LIPASE 97 (H) 08/25/2019 0253    Drugs of Abuse     Component Value Date/Time   LABOPIA POSITIVE (A) 10/12/2017 0300   COCAINSCRNUR NONE DETECTED 10/12/2017 0300   LABBENZ NONE DETECTED 10/12/2017 0300   AMPHETMU NONE DETECTED 10/12/2017 0300   THCU NONE DETECTED 10/12/2017 0300   LABBARB (A) 10/12/2017 0300    Result not available. Reagent lot number recalled by manufacturer.     RADIOLOGY STUDIES: No results found.    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 Stuttering improvement of symptoms.    PLAN:     *   Allow clear liquids now.  If doing okay, advanced first to full liquids rather than directly to soft foods.  Continue IV fluids of D5 1/2 NS at 125 mL/hour until he is reliably taking p.o. Add Pepcid while inpt, no PPI as it was stopped due to possible culpable in pancreatitis.   Leave creon in place   Azucena Freed  08/25/2019, 11:59  AM Phone [phone]  I have reviewed the entire case in detail with the above APP and discussed the plan in detail.  Therefore, I agree with the diagnoses recorded above. In addition,  I have personally interviewed and examined the patient and have personally reviewed any abdominal/pelvic CT scan images.  My additional thoughts are as follows:  I am familiar with this patient from his hospitalization in February of this year.  He has recurrent pancreatitis of unclear cause.  There is a lingering question of whether his pancreas divisum may be contributing. This episode seems to behave similar to the last time I saw him, with a somewhat stuttering course of improvement and then setback.  He looks generally well and comfortable.  Exam does not suggest ileus/obstruction.  His lipase has been slightly going back up last couple of days, but it does not need to be followed regularly as it will not correlate well with his symptoms or degree of pancreatitis.  No biliary obstruction. He does not have clinical signs of infection, and I do not think he needs repeat imaging of the pancreas.  We have backed off his diet, I think he just needs some more time and patience and he will improve. We will follow.  After discharge, he needs follow-up with Dr. Silverio Decamp.  It was apparently scheduled at some point, but he says he had to cancel for work reasons. Nelida Meuse III Office:[phone]

## 2019-08-26 LAB — COMPREHENSIVE METABOLIC PANEL
ALT: 57 U/L — ABNORMAL HIGH (ref 0–44)
AST: 69 U/L — ABNORMAL HIGH (ref 15–41)
Albumin: 3.2 g/dL — ABNORMAL LOW (ref 3.5–5.0)
Alkaline Phosphatase: 82 U/L (ref 38–126)
Anion gap: 8 (ref 5–15)
BUN: <5 mg/dL — ABNORMAL LOW (ref 6–20)
CO2: 27 mmol/L (ref 22–32)
Calcium: 9.6 mg/dL (ref 8.9–10.3)
Chloride: 104 mmol/L (ref 98–111)
Creatinine, Ser: 1.06 mg/dL (ref 0.61–1.24)
GFR calc Af Amer: >60 mL/min (ref >60)
GFR calc non Af Amer: >60 mL/min (ref >60)
Glucose, Bld: 100 mg/dL — ABNORMAL HIGH (ref 70–99)
Potassium: 3.5 mmol/L (ref 3.5–5.1)
Sodium: 139 mmol/L (ref 135–145)
Total Bilirubin: 0.6 mg/dL (ref 0.3–1.2)
Total Protein: 7 g/dL (ref 6.5–8.1)

## 2019-08-26 LAB — GLUCOSE, CAPILLARY
Glucose-Capillary: 103 mg/dL — ABNORMAL HIGH (ref 70–99)
Glucose-Capillary: 108 mg/dL — ABNORMAL HIGH (ref 70–99)
Glucose-Capillary: 123 mg/dL — ABNORMAL HIGH (ref 70–99)

## 2019-08-26 MED ORDER — ADULT MULTIVITAMIN W/MINERALS CH
1.0000 | ORAL_TABLET | Freq: Every day | ORAL | Status: DC
  Administered 2019-08-26 – 2019-08-28 (×3): 1 via ORAL
  Filled 2019-08-26 (×3): qty 1

## 2019-08-26 MED ORDER — BOOST / RESOURCE BREEZE PO LIQD CUSTOM
1.0000 | Freq: Three times a day (TID) | ORAL | Status: DC
  Administered 2019-08-26 – 2019-08-27 (×4): 1 via ORAL

## 2019-08-26 NOTE — Progress Notes (Addendum)
          Daily Rounding Note  08/26/2019, 9:35 AM  LOS: 5 days   SUBJECTIVE:   Chief complaint: Recurrent acute pancreatitis     Nausea resolved.  Abdominal pain persists, perhaps a bit better.  N.p.o. except for ice chips  OBJECTIVE:         Vital signs in last 24 hours:    Temp:  [97.9 F (36.6 C)-98.4 F (36.9 C)] 98.2 F (36.8 C) (05/12 0544) Pulse Rate:  [60-70] 70 (05/12 0544) Resp:  [16-18] 16 (05/12 0544) BP: (140-146)/(89-96) 140/94 (05/12 0544) SpO2:  [97 %-99 %] 99 % (05/12 0544) Last BM Date: 08/25/19 Filed Weights   08/21/19 0648  Weight: 79.3 kg   General: Nontoxic, does not look acutely ill.  Comfortable Heart: RRR Chest: No labored breathing or cough.  Lungs clear Abdomen: Soft, minimal tenderness, active bowel sounds Extremities: No CCE Neuro/Psych: Alert.  Appropriate.  Oriented x3.  In good spirits.  Intake/Output from previous day: 05/11 0701 - 05/12 0700 In: 3562.7 [P.O.:780; I.V.:2782.7] Out: -   Intake/Output this shift: No intake/output data recorded.  Lab Results: No results for input(s): WBC, HGB, HCT, PLT in the last 72 hours. BMET Recent Labs    08/24/19 0308 08/25/19 0253 08/26/19 0243  NA 138 138 139  K 3.6 4.0 3.5  CL 101 106 104  CO2 25 27 27   GLUCOSE 110* 110* 100*  BUN <5* <5* <5*  CREATININE 1.09 1.17 1.06  CALCIUM 9.0 8.8* 9.6   LFT Recent Labs    08/24/19 0308 08/25/19 0253 08/26/19 0243  PROT 6.6 6.2* 7.0  ALBUMIN 3.0* 2.8* 3.2*  AST 55* 52* 69*  ALT 37 40 57*  ALKPHOS 74 70 82  BILITOT 0.6 0.3 0.6    Scheduled Meds: . heparin  5,000 Units Subcutaneous Q8H  . lipase/protease/amylase  36,000 Units Oral BID WC   Continuous Infusions: . dextrose 5 % and 0.9% NaCl 125 mL/hr at 08/26/19 0607   PRN Meds:.hydrALAZINE, HYDROmorphone (DILAUDID) injection, labetalol, lipase/protease/amylase, ondansetron **OR** ondansetron (ZOFRAN) IV,  prochlorperazine  ASSESMENT:   *   Idiopathic pancreatitis in pt w pancreas divisum anatomy.    Clinically improved  Chronic epigastric pain  PLAN   *   Advance to clear liquids, if these are tolerated then advance through full liquids to low-fat diet.    Azucena Freed  08/26/2019, 9:35 AM Phone [phone]  I have discussed the case with the PA, and that is the plan I formulated. I personally interviewed and examined the patient.   We are again trying to slowly advance his diet and see if he tolerates it.  If so, cautiously advance to low-fat diet tomorrow.  If he tolerates that for a day, he can be discharged home to follow-up with Dr. Silverio Decamp.  Signing off, feel free to call as needed. Nelida Meuse III Office: [phone]

## 2019-08-26 NOTE — Progress Notes (Signed)
Nutrition Follow-up  DOCUMENTATION CODES:   Not applicable  INTERVENTION:   -MVI with minerals daily -Boost Breeze po TID, each supplement provides 250 kcal and 9 grams of protein -RD will follow for diet advancement and adjust supplement regimen as appropriate -If prolonged NPO/ clear liquid diet status is anticipated, consider initiation of nutrition support  NUTRITION DIAGNOSIS:   Increased nutrient needs related to chronic illness(pancreatitis) as evidenced by estimated needs.  Ongoing  GOAL:   Patient will meet greater than or equal to 90% of their needs  Progressing   MONITOR:   Diet advancement, Labs, Weight trends, Skin, I & O's  REASON FOR ASSESSMENT:   Consult Assessment of nutrition requirement/status  ASSESSMENT:   Kyle Berry is a 49 y.o. 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  Unable to keep anything pain worsening.  Denies any shortness of breath or chest pain had some subjective feeling of fever chills.  Reviewed I/O's: +3.6 L x 24 hours and +19.8 L since admission  Pt advanced to clear liquid diet today; no meal completion records to assess at this time. Pt amenable to supplements at yesterday's visit. Will add Boost Breeze.   Labs reviewed: CBGS: 103-146.   Diet Order:   Diet Order            Diet clear liquid Room service appropriate? Yes; Fluid consistency: Thin  Diet effective now              EDUCATION NEEDS:   Education needs have been addressed  Skin:  Skin Assessment: Reviewed RN Assessment  Last BM:  08/25/19  Height:   Ht Readings from Last 1 Encounters:  08/21/19 6\' 1"  (1.854 m)    Weight:   Wt  Readings from Last 1 Encounters:  08/21/19 79.3 kg    Ideal Body Weight:  83.6 kg  BMI:  Body mass index is 23.07 kg/m.  Estimated Nutritional Needs:   Kcal:  2300-2500  Protein:  120-135 grams  Fluid:  > 2.3 L    Loistine Chance, RD, LDN, [place] Registered Dietitian II Certified Diabetes Care and Education Specialist Please refer to [facility] for RD and/or RD on-call/weekend/after hours pager

## 2019-08-26 NOTE — Progress Notes (Signed)
PROGRESS NOTE    JULIOCESAR Berry  ZOX:[id number] DOB: December 27, 1970 DOA: 08/20/2019 PCP: [facility] (Inactive)    Brief Narrative:  Kyle Berry is a 49 year old male with history of essential hypertension, chronic pancreatitis status post cholecystectomy 2019 with no alcohol use for many years who presented to the ED with persistent abdominal pain localized to the epigastrium region with radiation to the back associated with nausea, vomiting and diarrhea.  Patient denies any hematemesis.  Patient unable to keep any oral intake down with progressive worsening of pain.  Denies shortness of breath or chest pain.  In the ER patient was tachycardic with labs showing elevated lipase of 339 AST 89 ALT 57 CBC shows microcytic picture.  Total bilirubin 1.3.  CT abdomen pelvis shows features concerning for pancreatitis EKG shows normal sinus rhythm.  Patient started on IV fluids pain relief medication admitted for acute pancreatitis.  Cause of which is not clear.   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  No recent alcohol use for many years, previous cholecystectomy 2019.  Known pancreatic divisum.  Lipase elevated to 339 on admission.  CT abdomen/pelvis notable for acute pancreatitis with no abscess or pseudocyst and no intrahepatic biliary dilation. --[facility] following, appreciate assistance --Lipase 339>104>68>84>97 --Continue IVF with D5NS 25 mL's per hour --Transition from n.p.o. to clear liquid diet today GI --Continue Creon 36K units BID w/ meals and prn w/ snacks --Compazine 10 mg every 4 hours prn --Dilaudid 1 mg every 3 hours as needed for severe pain  Essential  hypertension On telmisartan 40 mg p.o. daily at home. --Continue to hold home antihypertensives until oral intake improves --Labetalol IV as needed   DVT prophylaxis: Heparin Code Status: Full code Family Communication: No family present at bedside  Disposition Plan:  Status is: Inpatient  Remains inpatient appropriate because:Ongoing active pain requiring inpatient pain management, Unsafe d/c plan and IV treatments appropriate due to intensity of illness or inability to take PO   Dispo: The patient is from: Home              Anticipated d/c is to: Home              Anticipated d/c date is: 2 days              Patient currently is not medically stable to d/c.         Consultants:   [facility]  Procedures:   None  Antimicrobials:   None   Subjective: Patient seen and examined bedside, resting comfortably.  Continues with epigastric pain, although much improved since initial presentation.  GI advancing diet today.  No other complaints or concerns at this time.  Denies headache, no chest pain, no palpitations, no shortness of breath, no fever/chills/night sweats, no nausea/vomiting/diarrhea, no weakness, no fatigue, no cough/congestion, no paresthesias.  No acute events overnight per nursing staff.  Objective: Vitals:   08/25/19 1435 08/25/19 2027 08/26/19 0544 08/26/19 1421  BP: (!) 141/89 (!) 146/96 (!) 140/94 113/83  Pulse: 60 66 70 70  Resp: 18 17 16 16   Temp: 98.4 F (36.9 C) 97.9 F (36.6 C) 98.2 F (36.8 C) 98.3 F (36.8 C)  TempSrc: Oral Oral Oral   SpO2: 97%  99% 99% 96%  Weight:      Height:        Intake/Output Summary (Last 24 hours) at 08/26/2019 1542 Last data filed at 08/26/2019 4098 Gross per 24 hour  Intake 2842.74 ml  Output --  Net 2842.74 ml   Filed Weights   08/21/19 0648  Weight: 79.3 kg    Examination:  General exam: Appears calm and comfortable  Respiratory system: Clear to auscultation. Respiratory effort  normal. Cardiovascular system: S1 & S2 heard, RRR. No JVD, murmurs, rubs, gallops or clicks. No pedal edema. Gastrointestinal system: Abdomen is nondistended, soft with mild epigastric tenderness,. No organomegaly or masses felt. Normal bowel sounds heard. Central nervous system: Alert and oriented. No focal neurological deficits. Extremities: Symmetric 5 x 5 power. Skin: No rashes, lesions or ulcers Psychiatry: Judgement and insight appear normal. Mood & affect appropriate.     Data Reviewed: I have personally reviewed following labs and imaging studies  CBC: Recent Labs  Lab 08/20/19 1520 08/21/19 0639  WBC 8.8 9.5  NEUTROABS  --  8.2*  HGB 13.2 12.2*  HCT 40.6 37.3*  MCV 100.5* 100.8*  PLT 182 157   Basic Metabolic Panel: Recent Labs  Lab 08/22/19 0447 08/23/19 0153 08/24/19 0308 08/25/19 0253 08/26/19 0243  NA 132* 136 138 138 139  K 3.8 3.8 3.6 4.0 3.5  CL 96* 99 101 106 104  CO2 24 27 25 27 27   GLUCOSE 87 99 110* 110* 100*  BUN <5* <5* <5* <5* <5*  CREATININE 1.09 1.09 1.09 1.17 1.06  CALCIUM 8.5* 8.8* 9.0 8.8* 9.6   GFR: Estimated Creatinine Clearance: 94.6 mL/min (by C-G formula based on SCr of 1.06 mg/dL). Liver Function Tests: Recent Labs  Lab 08/22/19 0447 08/23/19 0153 08/24/19 0308 08/25/19 0253 08/26/19 0243  AST 42* 35 55* 52* 69*  ALT 32 30 37 40 57*  ALKPHOS 58 59 74 70 82  BILITOT 1.0 1.0 0.6 0.3 0.6  PROT 6.1* 6.0* 6.6 6.2* 7.0  ALBUMIN 2.8* 2.6* 3.0* 2.8* 3.2*   Recent Labs  Lab 08/20/19 1520 08/22/19 0447 08/23/19 0153 08/24/19 0308 08/25/19 0253  LIPASE 339* 104* 68* 84* 97*   No results for input(s): AMMONIA in the last 168 hours. Coagulation Profile: No results for input(s): INR, PROTIME in the last 168 hours. Cardiac Enzymes: No results for input(s): CKTOTAL, CKMB, CKMBINDEX, TROPONINI in the last 168 hours. BNP (last 3 results) No results for input(s): PROBNP in the last 8760 hours. HbA1C: No results for input(s):  HGBA1C in the last 72 hours. CBG: Recent Labs  Lab 08/24/19 2351 08/25/19 0731 08/25/19 1659 08/26/19 0004 08/26/19 0847  GLUCAP 105* 107* 146* 123* 103*   Lipid Profile: No results for input(s): CHOL, HDL, LDLCALC, TRIG, CHOLHDL, LDLDIRECT in the last 72 hours. Thyroid Function Tests: No results for input(s): TSH, T4TOTAL, FREET4, T3FREE, THYROIDAB in the last 72 hours. Anemia Panel: No results for input(s): VITAMINB12, FOLATE, FERRITIN, TIBC, IRON, RETICCTPCT in the last 72 hours. Sepsis Labs: No results for input(s): PROCALCITON, LATICACIDVEN in the last 168 hours.  Recent Results (from the past 240 hour(s))  Respiratory Panel by RT PCR (Flu A&B, Covid) - Nasopharyngeal Swab     Status: None   Collection Time: 08/21/19  5:39 AM   Specimen: Nasopharyngeal Swab  Result Value Ref Range Status   SARS Coronavirus 2 by RT PCR NEGATIVE NEGATIVE Final    Comment: (NOTE) SARS-CoV-2 target nucleic acids are NOT DETECTED. The SARS-CoV-2 RNA is generally  detectable in upper respiratoy specimens during the acute phase of infection. The lowest concentration of SARS-CoV-2 viral copies this assay can detect is 131 copies/mL. A negative result does not preclude SARS-Cov-2 infection and should not be used as the sole basis for treatment or other patient management decisions. A negative result may occur with  improper specimen collection/handling, submission of specimen other than nasopharyngeal swab, presence of viral mutation(s) within the areas targeted by this assay, and inadequate number of viral copies (<131 copies/mL). A negative result must be combined with clinical observations, patient history, and epidemiological information. The expected result is Negative. Fact Sheet for Patients:  https://www.moore.com/ Fact Sheet for Healthcare Providers:  https://www.young.biz/ This test is not yet ap proved or cleared by the [place] FDA and   has been authorized for detection and/or diagnosis of SARS-CoV-2 by FDA under an Emergency Use Authorization (EUA). This EUA will remain  in effect (meaning this test can be used) for the duration of the COVID-19 declaration under Section 564(b)(1) of the Act, 21 U.S.C. section 360bbb-3(b)(1), unless the authorization is terminated or revoked sooner.    Influenza A by PCR NEGATIVE NEGATIVE Final   Influenza B by PCR NEGATIVE NEGATIVE Final    Comment: (NOTE) The Xpert Xpress SARS-CoV-2/FLU/RSV assay is intended as an aid in  the diagnosis of influenza from Nasopharyngeal swab specimens and  should not be used as a sole basis for treatment. Nasal washings and  aspirates are unacceptable for Xpert Xpress SARS-CoV-2/FLU/RSV  testing. Fact Sheet for Patients: https://www.moore.com/ Fact Sheet for Healthcare Providers: https://www.young.biz/ This test is not yet approved or cleared by the [place] FDA and  has been authorized for detection and/or diagnosis of SARS-CoV-2 by  FDA under an Emergency Use Authorization (EUA). This EUA will remain  in effect (meaning this test can be used) for the duration of the  Covid-19 declaration under Section 564(b)(1) of the Act, 21  U.S.C. section 360bbb-3(b)(1), unless the authorization is  terminated or revoked. Performed at [facility], [address] [place], [place] [address]          Radiology Studies: No results found.      Scheduled Meds: . feeding supplement  1 Container Oral TID BM  . heparin  5,000 Units Subcutaneous Q8H  . lipase/protease/amylase  36,000 Units Oral BID WC  . multivitamin with minerals  1 tablet Oral Daily   Continuous Infusions: . dextrose 5 % and 0.9% NaCl 125 mL/hr at 08/26/19 1357     LOS: 5 days    Time spent: 36 minutes spent on chart review, discussion with nursing staff, consultants, updating family and interview/physical exam; more than 50% of  that time was spent in counseling and/or coordination of care.    Alvira Philips [place], DO [facility] Available via Epic secure chat 7am-7pm After these hours, please refer to coverage provider listed on [email or web address] 08/26/2019, 3:42 PM

## 2019-08-27 LAB — BASIC METABOLIC PANEL
Anion gap: 8 (ref 5–15)
BUN: <5 mg/dL — ABNORMAL LOW (ref 6–20)
CO2: 26 mmol/L (ref 22–32)
Calcium: 9 mg/dL (ref 8.9–10.3)
Chloride: 105 mmol/L (ref 98–111)
Creatinine, Ser: 1.1 mg/dL (ref 0.61–1.24)
GFR calc Af Amer: >60 mL/min (ref >60)
GFR calc non Af Amer: >60 mL/min (ref >60)
Glucose, Bld: 109 mg/dL — ABNORMAL HIGH (ref 70–99)
Potassium: 3.8 mmol/L (ref 3.5–5.1)
Sodium: 139 mmol/L (ref 135–145)

## 2019-08-27 LAB — MAGNESIUM: Magnesium: 1.6 mg/dL — ABNORMAL LOW (ref 1.7–2.4)

## 2019-08-27 LAB — GLUCOSE, CAPILLARY
Glucose-Capillary: 101 mg/dL — ABNORMAL HIGH (ref 70–99)
Glucose-Capillary: 115 mg/dL — ABNORMAL HIGH (ref 70–99)
Glucose-Capillary: 124 mg/dL — ABNORMAL HIGH (ref 70–99)

## 2019-08-27 MED ORDER — MAGNESIUM SULFATE 2 GM/50ML IV SOLN
2.0000 g | INTRAVENOUS | Status: AC
  Administered 2019-08-27 (×2): 2 g via INTRAVENOUS
  Filled 2019-08-27 (×2): qty 50

## 2019-08-27 MED ORDER — OXYCODONE HCL 5 MG PO TABS
5.0000 mg | ORAL_TABLET | ORAL | Status: DC | PRN
  Administered 2019-08-27 – 2019-08-28 (×4): 5 mg via ORAL
  Filled 2019-08-27 (×4): qty 1

## 2019-08-27 NOTE — Progress Notes (Addendum)
          Daily Rounding Note  08/27/2019, 10:03 AM  LOS: 6 days   SUBJECTIVE:   Chief complaint: acute pancreatitis     Upper abd pain better but persists, food not triggering.   Nausea briefly last night.  Last BM 2 d ago  OBJECTIVE:         Vital signs in last 24 hours:    Temp:  [98.3 F (36.8 C)-98.5 F (36.9 C)] 98.4 F (36.9 C) (05/13 0419) Pulse Rate:  [59-70] 59 (05/13 0419) Resp:  [16-18] 17 (05/13 0419) BP: (113-152)/(77-97) 134/77 (05/13 0419) SpO2:  [96 %-100 %] 97 % (05/13 0419) Last BM Date: 08/25/19 Filed Weights   08/21/19 0648  Weight: 79.3 kg   General: looks well, comfortable, a bit thin   Heart: RRR Chest: clear.  No cough or dyspnea. Abdomen: soft, diffuse minor tenderness.  Active BS.  ND  Extremities: no CCE.    Neuro/Psych:  Fully alert/oriented w fluid speech.  Affect engaged, normal.  Walking about room w/o issues.   Review of systems: No chest pain or dyspnea  Intake/Output from previous day: 05/12 0701 - 05/13 0700 In: 2545.3 [P.O.:240; I.V.:2305.3] Out: 750 [Urine:750]  Intake/Output this shift: No intake/output data recorded.  Lab Results: No results for input(s): WBC, HGB, HCT, PLT in the last 72 hours. BMET Recent Labs    08/25/19 0253 08/26/19 0243 08/27/19 0109  NA 138 139 139  K 4.0 3.5 3.8  CL 106 104 105  CO2 27 27 26   GLUCOSE 110* 100* 109*  BUN <5* <5* <5*  CREATININE 1.17 1.06 1.10  CALCIUM 8.8* 9.6 9.0   LFT Recent Labs    08/25/19 0253 08/26/19 0243  PROT 6.2* 7.0  ALBUMIN 2.8* 3.2*  AST 52* 69*  ALT 40 57*  ALKPHOS 70 82  BILITOT 0.3 0.6     ASSESMENT:   *   Idiopathic pancreatitis in pt w pancreas divisum anatomy.    Improved nausea,  Abdominal pain.    PLAN   *    Advance to low fat diet.      Azucena Freed  08/27/2019, 10:03 AM Phone [phone]  I have discussed the case with the PA, and that is the plan I formulated.  Slow  improvement in his pancreatitis.  Advancing diet today, follow clinically.  Expect he will steadily improved but not likely be pain-free at the time of discharge. I have already notified Dr. Silverio Decamp of the need for post hospital follow-up. GI signing off, feel free to call as needed.    Nelida Meuse III Office: [phone]

## 2019-08-27 NOTE — Progress Notes (Signed)
PROGRESS NOTE    Kyle Berry  WUJ:[id number] DOB: [birth year] DOA: 08/20/2019 PCP: [facility] (Inactive)    Brief Narrative:  Kyle Berry is a 49 year old male with history of essential hypertension, chronic pancreatitis status post cholecystectomy 2019 with no alcohol use for many years who presented to the ED with persistent abdominal pain localized to the epigastrium region with radiation to the back associated with nausea, vomiting and diarrhea.  Patient denies any hematemesis.  Patient unable to keep any oral intake down with progressive worsening of pain.  Denies shortness of breath or chest pain.  In the ER patient was tachycardic with labs showing elevated lipase of 339 AST 89 ALT 57 CBC shows microcytic picture.  Total bilirubin 1.3.  CT abdomen pelvis shows features concerning for pancreatitis EKG shows normal sinus rhythm.  Patient started on IV fluids pain relief medication admitted for acute pancreatitis.  Cause of which is not clear.   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  No recent alcohol use for many years, previous cholecystectomy 2019.  Known pancreatic divisum.  Lipase elevated to 339 on admission.  CT abdomen/pelvis notable for acute pancreatitis with no abscess or pseudocyst and no intrahepatic biliary dilation. --[facility] following, appreciate assistance --Lipase 339>104>68>84>97 --Transition from clear liquid diet to full liquid diet today, now transition to low-fat diet per GI --Continue Creon 36K units BID w/ meals and prn w/ snacks --Compazine 10 mg every 4 hours prn --Oxycodone 5 mg p.o. every 4 hours as needed for moderate  pain --Discontinue IV fluids and IV Dilaudid now that diet is transitioned  Essential hypertension On telmisartan 40 mg p.o. daily at home. --Continue to hold home antihypertensives until oral intake improves --Labetalol IV as needed   DVT prophylaxis: Heparin Code Status: Full code Family Communication: No family present at bedside  Disposition Plan:  Status is: Inpatient  Remains inpatient appropriate because:Ongoing active pain requiring inpatient pain management, Unsafe d/c plan and IV treatments appropriate due to intensity of illness or inability to take PO   Dispo: The patient is from: Home              Anticipated d/c is to: Home              Anticipated d/c date is: 1 day              Patient currently is not medically stable to d/c.    Consultants:   [facility]  Procedures:   None  Antimicrobials:   None   Subjective: Patient seen and examined bedside, resting comfortably.  Abdominal pain improved.  Tolerating clear liquid diet, now transition to low-fat diet.  No other complaints or concerns at this time.  Denies headache, no chest pain, no palpitations, no shortness of breath, no fever/chills/night sweats, no nausea/vomiting/diarrhea, no weakness, no fatigue, no cough/congestion, no paresthesias.  No acute events overnight per nursing staff.  Objective: Vitals:   08/26/19 0544 08/26/19 1421 08/26/19 2211 08/27/19 0419  BP: (!) 140/94 113/83 (!) 152/97 134/77  Pulse: 70 70 62 (!) 59  Resp: 16 16 18 17   Temp: 98.2 F (36.8 C) 98.3 F (36.8 C) 98.5 F (36.9 C) 98.4 F (36.9 C)  TempSrc: Oral  Oral Oral  SpO2: 99% 96% 100% 97%  Weight:      Height:        Intake/Output Summary (Last 24 hours) at 08/27/2019 1317 Last data filed at 08/27/2019 0432 Gross per 24 hour  Intake 2545.32 ml  Output 750 ml  Net 1795.32 ml   Filed Weights   08/21/19 0648  Weight: 79.3 kg    Examination:  General exam: Appears calm and comfortable  Respiratory  system: Clear to auscultation. Respiratory effort normal. Cardiovascular system: S1 & S2 heard, RRR. No JVD, murmurs, rubs, gallops or clicks. No pedal edema. Gastrointestinal system: Abdomen is nondistended, soft with mild epigastric tenderness,. No organomegaly or masses felt. Normal bowel sounds heard. Central nervous system: Alert and oriented. No focal neurological deficits. Extremities: Symmetric 5 x 5 power. Skin: No rashes, lesions or ulcers Psychiatry: Judgement and insight appear normal. Mood & affect appropriate.     Data Reviewed: I have personally reviewed following labs and imaging studies  CBC: Recent Labs  Lab 08/20/19 1520 08/21/19 0639  WBC 8.8 9.5  NEUTROABS  --  8.2*  HGB 13.2 12.2*  HCT 40.6 37.3*  MCV 100.5* 100.8*  PLT 182 157   Basic Metabolic Panel: Recent Labs  Lab 08/23/19 0153 08/24/19 0308 08/25/19 0253 08/26/19 0243 08/27/19 0109  NA 136 138 138 139 139  K 3.8 3.6 4.0 3.5 3.8  CL 99 101 106 104 105  CO2 27 25 27 27 26   GLUCOSE 99 110* 110* 100* 109*  BUN <5* <5* <5* <5* <5*  CREATININE 1.09 1.09 1.17 1.06 1.10  CALCIUM 8.8* 9.0 8.8* 9.6 9.0  MG  --   --   --   --  1.6*   GFR: Estimated Creatinine Clearance: 91.1 mL/min (by C-G formula based on SCr of 1.1 mg/dL). Liver Function Tests: Recent Labs  Lab 08/22/19 0447 08/23/19 0153 08/24/19 0308 08/25/19 0253 08/26/19 0243  AST 42* 35 55* 52* 69*  ALT 32 30 37 40 57*  ALKPHOS 58 59 74 70 82  BILITOT 1.0 1.0 0.6 0.3 0.6  PROT 6.1* 6.0* 6.6 6.2* 7.0  ALBUMIN 2.8* 2.6* 3.0* 2.8* 3.2*   Recent Labs  Lab 08/20/19 1520 08/22/19 0447 08/23/19 0153 08/24/19 0308 08/25/19 0253  LIPASE 339* 104* 68* 84* 97*   No results for input(s): AMMONIA in the last 168 hours. Coagulation Profile: No results for input(s): INR, PROTIME in the last 168 hours. Cardiac Enzymes: No results for input(s): CKTOTAL, CKMB, CKMBINDEX, TROPONINI in the last 168 hours. BNP (last 3 results) No results  for input(s): PROBNP in the last 8760 hours. HbA1C: No results for input(s): HGBA1C in the last 72 hours. CBG: Recent Labs  Lab 08/26/19 0004 08/26/19 0847 08/26/19 1643 08/27/19 0053 08/27/19 0807  GLUCAP 123* 103* 108* 124* 115*   Lipid Profile: No results for input(s): CHOL, HDL, LDLCALC, TRIG, CHOLHDL, LDLDIRECT in the last 72 hours. Thyroid Function Tests: No results for input(s): TSH, T4TOTAL, FREET4, T3FREE, THYROIDAB in the last 72 hours. Anemia Panel: No results for input(s): VITAMINB12, FOLATE, FERRITIN, TIBC, IRON, RETICCTPCT in the last 72 hours. Sepsis Labs: No results for input(s): PROCALCITON, LATICACIDVEN in the last 168 hours.  Recent Results (from the past 240 hour(s))  Respiratory Panel by RT PCR (Flu A&B, Covid) - Nasopharyngeal Swab     Status: None   Collection Time: 08/21/19  5:39 AM   Specimen: Nasopharyngeal Swab  Result Value Ref Range Status   SARS Coronavirus 2 by RT  PCR NEGATIVE NEGATIVE Final    Comment: (NOTE) SARS-CoV-2 target nucleic acids are NOT DETECTED. The SARS-CoV-2 RNA is generally detectable in upper respiratoy specimens during the acute phase of infection. The lowest concentration of SARS-CoV-2 viral copies this assay can detect is 131 copies/mL. A negative result does not preclude SARS-Cov-2 infection and should not be used as the sole basis for treatment or other patient management decisions. A negative result may occur with  improper specimen collection/handling, submission of specimen other than nasopharyngeal swab, presence of viral mutation(s) within the areas targeted by this assay, and inadequate number of viral copies (<131 copies/mL). A negative result must be combined with clinical observations, patient history, and epidemiological information. The expected result is Negative. Fact Sheet for Patients:  https://www.moore.com/ Fact Sheet for Healthcare Providers:   https://www.young.biz/ This test is not yet ap proved or cleared by the [place] FDA and  has been authorized for detection and/or diagnosis of SARS-CoV-2 by FDA under an Emergency Use Authorization (EUA). This EUA will remain  in effect (meaning this test can be used) for the duration of the COVID-19 declaration under Section 564(b)(1) of the Act, 21 U.S.C. section 360bbb-3(b)(1), unless the authorization is terminated or revoked sooner.    Influenza A by PCR NEGATIVE NEGATIVE Final   Influenza B by PCR NEGATIVE NEGATIVE Final    Comment: (NOTE) The Xpert Xpress SARS-CoV-2/FLU/RSV assay is intended as an aid in  the diagnosis of influenza from Nasopharyngeal swab specimens and  should not be used as a sole basis for treatment. Nasal washings and  aspirates are unacceptable for Xpert Xpress SARS-CoV-2/FLU/RSV  testing. Fact Sheet for Patients: https://www.moore.com/ Fact Sheet for Healthcare Providers: https://www.young.biz/ This test is not yet approved or cleared by the [place] FDA and  has been authorized for detection and/or diagnosis of SARS-CoV-2 by  FDA under an Emergency Use Authorization (EUA). This EUA will remain  in effect (meaning this test can be used) for the duration of the  Covid-19 declaration under Section 564(b)(1) of the Act, 21  U.S.C. section 360bbb-3(b)(1), unless the authorization is  terminated or revoked. Performed at [facility], [address], [place], [place] [address]          Radiology Studies: No results found.      Scheduled Meds: . feeding supplement  1 Container Oral TID BM  . heparin  5,000 Units Subcutaneous Q8H  . lipase/protease/amylase  36,000 Units Oral BID WC  . multivitamin with minerals  1 tablet Oral Daily   Continuous Infusions: . dextrose 5 % and 0.9% NaCl 125 mL/hr at 08/27/19 0539     LOS: 6 days    Time spent: 34 minutes spent on  chart review, discussion with nursing staff, consultants, updating family and interview/physical exam; more than 50% of that time was spent in counseling and/or coordination of care.    Alvira Philips [place], DO [facility] Available via Epic secure chat 7am-7pm After these hours, please refer to coverage provider listed on [email or web address] 08/27/2019, 1:17 PM

## 2019-08-27 NOTE — Plan of Care (Signed)
  Problem: Education: Goal: Knowledge of General Education information will improve Description: Including pain rating scale, medication(s)/side effects and non-pharmacologic comfort measures Outcome: Progressing   Problem: Health Behavior/Discharge Planning: Goal: Ability to manage health-related needs will improve Outcome: Progressing   Problem: Clinical Measurements: Goal: Ability to maintain clinical measurements within normal limits will improve Outcome: Progressing Goal: Respiratory complications will improve Outcome: Progressing Goal: Cardiovascular complication will be avoided Outcome: Progressing   Problem: Activity: Goal: Risk for activity intolerance will decrease Outcome: Progressing   Problem: Nutrition: Goal: Adequate nutrition will be maintained Outcome: Progressing   Problem: Elimination: Goal: Will not experience complications related to urinary retention Outcome: Progressing   Problem: Pain Managment: Goal: General experience of comfort will improve Outcome: Progressing   Problem: Safety: Goal: Ability to remain free from injury will improve Outcome: Progressing   Problem: Skin Integrity: Goal: Risk for impaired skin integrity will decrease Outcome: Progressing   

## 2019-08-27 NOTE — Evaluation (Signed)
Physical Therapy Evaluation and Discharge Patient Details Name: Kyle Berry MRN: [id number] DOB: Apr 09, 1971 Today's Date: 08/27/2019   History of Present Illness  Pt is a 49 y/o male admitted secondary to acute pancreatitis. PMH includes chronic pancreatits and HTN.   Clinical Impression  Patient evaluated by Physical Therapy with no further acute PT needs identified. All education has been completed and the patient has no further questions. Pt overall independent to mod I with gait and stair navigation this session. Scored 23 on DGI indicating low fall risk. See below for any follow-up Physical Therapy or equipment needs. PT is signing off. Thank you for this referral. If needs change, please re-consult.     Follow Up Recommendations No PT follow up    Equipment Recommendations  None recommended by PT    Recommendations for Other Services       Precautions / Restrictions Precautions Precautions: None Restrictions Weight Bearing Restrictions: No      Mobility  Bed Mobility Overal bed mobility: Independent                Transfers Overall transfer level: Independent                  Ambulation/Gait Ambulation/Gait assistance: Independent Gait Distance (Feet): 500 Feet Assistive device: None Gait Pattern/deviations: WFL(Within Functional Limits) Gait velocity: WFL    General Gait Details: Overall independent and able to perform DGI tasks without LOB.   Stairs Stairs: Yes Stairs assistance: Modified independent (Device/Increase time) Stair Management: One rail Right;Alternating pattern;Forwards Number of Stairs: 6 General stair comments: Overall mod I for stair navigation using rail. No LOB noted.   Wheelchair Mobility    Modified Rankin (Stroke Patients Only)       Balance Overall balance assessment: Independent                               Standardized Balance Assessment Standardized Balance Assessment : Dynamic Gait  Index   Dynamic Gait Index Level Surface: Normal Change in Gait Speed: Normal Gait with Horizontal Head Turns: Normal Gait with Vertical Head Turns: Normal Gait and Pivot Turn: Normal Step Over Obstacle: Normal Step Around Obstacles: Normal Steps: Mild Impairment Total Score: 23       Pertinent Vitals/Pain Pain Assessment: 0-10 Pain Score: 7  Pain Descriptors / Indicators: Dull Pain Intervention(s): Limited activity within patient's tolerance;Monitored during session;Repositioned    Home Living Family/patient expects to be discharged to:: Private residence Living Arrangements: Spouse/significant other Available Help at Discharge: Family Type of Home: Apartment Home Access: Stairs to enter Entrance Stairs-Rails: Left;Right;Can reach both Technical brewer of Steps: flight Home Layout: One level Home Equipment: None      Prior Function Level of Independence: Independent               Hand Dominance        Extremity/Trunk Assessment   Upper Extremity Assessment Upper Extremity Assessment: Overall WFL for tasks assessed    Lower Extremity Assessment Lower Extremity Assessment: Overall WFL for tasks assessed    Cervical / Trunk Assessment Cervical / Trunk Assessment: Normal  Communication   Communication: No difficulties  Cognition Arousal/Alertness: Awake/alert Behavior During Therapy: WFL for tasks assessed/performed Overall Cognitive Status: Within Functional Limits for tasks assessed  General Comments      Exercises     Assessment/Plan    PT Assessment Patent does not need any further PT services  PT Problem List         PT Treatment Interventions      PT Goals (Current goals can be found in the Care Plan section)  Acute Rehab PT Goals Patient Stated Goal: to go home PT Goal Formulation: With patient Time For Goal Achievement: 08/27/19 Potential to Achieve Goals: Good     Frequency     Barriers to discharge        Co-evaluation               AM-PAC PT "6 Clicks" Mobility  Outcome Measure Help needed turning from your back to your side while in a flat bed without using bedrails?: None Help needed moving from lying on your back to sitting on the side of a flat bed without using bedrails?: None Help needed moving to and from a bed to a chair (including a wheelchair)?: None Help needed standing up from a chair using your arms (e.g., wheelchair or bedside chair)?: None Help needed to walk in hospital room?: None Help needed climbing 3-5 steps with a railing? : None 6 Click Score: 24    End of Session   Activity Tolerance: Patient tolerated treatment well Patient left: in bed;with call bell/phone within reach Nurse Communication: Mobility status PT Visit Diagnosis: Other abnormalities of gait and mobility (R26.89)    Time: [id number] PT Time Calculation (min) (ACUTE ONLY): 16 min   Charges:   PT Evaluation $PT Eval Low Complexity: 1 Low          Lou Miner, DPT  Acute Rehabilitation Services  Pager: [phone] Office: [phone]   Rudean Hitt 08/27/2019, 3:30 PM

## 2019-08-28 LAB — GLUCOSE, CAPILLARY: Glucose-Capillary: 119 mg/dL — ABNORMAL HIGH (ref 70–99)

## 2019-08-28 MED ORDER — ENSURE ENLIVE PO LIQD
237.0000 mL | Freq: Two times a day (BID) | ORAL | Status: DC
  Administered 2019-08-28: 237 mL via ORAL

## 2019-08-28 MED ORDER — OXYCODONE HCL 5 MG PO TABS: 5.0000 mg | ORAL_TABLET | Freq: Four times a day (QID) | ORAL | 0 refills | Status: AC | PRN

## 2019-08-28 NOTE — Plan of Care (Signed)
  Problem: Education: Goal: Knowledge of General Education information will improve Description: Including pain rating scale, medication(s)/side effects and non-pharmacologic comfort measures Outcome: Progressing   Problem: Health Behavior/Discharge Planning: Goal: Ability to manage health-related needs will improve Outcome: Progressing   Problem: Clinical Measurements: Goal: Ability to maintain clinical measurements within normal limits will improve Outcome: Progressing Goal: Respiratory complications will improve Outcome: Progressing   Problem: Activity: Goal: Risk for activity intolerance will decrease Outcome: Progressing   Problem: Coping: Goal: Level of anxiety will decrease Outcome: Progressing   Problem: Pain Managment: Goal: General experience of comfort will improve Outcome: Progressing   Problem: Safety: Goal: Ability to remain free from injury will improve Outcome: Progressing   Problem: Skin Integrity: Goal: Risk for impaired skin integrity will decrease Outcome: Progressing

## 2019-08-28 NOTE — Progress Notes (Signed)
Nutrition Follow-up  DOCUMENTATION CODES:   Not applicable  INTERVENTION:   -Continue MVI with minerals daily -D/c Boost Breeze po TID, each supplement provides 250 kcal and 9 grams of protein -Ensure Enlive po BID, each supplement provides 350 kcal and 20 grams of protein  NUTRITION DIAGNOSIS:   Increased nutrient needs related to chronic illness(pancreatitis) as evidenced by estimated needs.  Ongoing  GOAL:   Patient will meet greater than or equal to 90% of their needs  Progressing   MONITOR:   Diet advancement, Labs, Weight trends, Skin, I & O's  REASON FOR ASSESSMENT:   Consult Assessment of nutrition requirement/status  ASSESSMENT:   Kyle Berry is a 49 y.o. 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  Unable to keep anything pain worsening.  Denies any shortness of breath or chest pain had some subjective feeling of fever chills.  5/12- advanced to clear liquid diet 5/13- advanced to full liquids, advanced to heart healthy diet  Reviewed I/O's: +1.3 L x 24 hours and +22.9 L since admission  Attempted to speak with pt via phone, however, no answer. Pt now on a heart healthy diet; consuming 80-100% of meals. Per MD notes, pt abdominal pain has improved. He is consuming Boost Breeze supplements.   Medications reviewed and include creon.   Labs reviewed: CBGS: 101-124.   Diet Order:   Diet Order            Diet Heart Room service appropriate? Yes; Fluid consistency: Thin  Diet effective now              EDUCATION NEEDS:   Education needs have been addressed  Skin:  Skin Assessment: Reviewed RN Assessment  Last BM:   08/27/19  Height:   Ht Readings from Last 1 Encounters:  08/21/19 6\' 1"  (1.854 m)    Weight:   Wt Readings from Last 1 Encounters:  08/21/19 79.3 kg    Ideal Body Weight:  83.6 kg  BMI:  Body mass index is 23.07 kg/m.  Estimated Nutritional Needs:   Kcal:  2300-2500  Protein:  120-135 grams  Fluid:  > 2.3 L    Loistine Chance, RD, LDN, [place] Registered Dietitian II Certified Diabetes Care and Education Specialist Please refer to [facility] for RD and/or RD on-call/weekend/after hours pager

## 2019-08-28 NOTE — Progress Notes (Signed)
AVS given and reviewed with pt. Medications discussed. All questions answered to satisfaction. Pt verbalized understanding of information given. Pt to be escorted off the unit with all belongings via wheelchair by volunteer services.

## 2019-08-28 NOTE — Discharge Summary (Signed)
Physician Discharge Summary  EYTAN KRAVETS ZHY:[id number] DOB: [birth year] DOA: 08/20/2019  PCP: Cline Crock (Inactive)  Admit date: 08/20/2019 Discharge date: 08/28/2019  Admitted From: Home Disposition: Home  Recommendations for Outpatient Follow-up:  1. Follow up with PCP in 1-2 weeks 2. Follow-up with gastroenterology as scheduled  Home Health: No Equipment/Devices: None  Discharge Condition: Stable CODE STATUS: Full code Diet recommendation: Low-fat diet  History of present illness:  Kyle Berry is a 49 year old male with history of essential hypertension, chronic pancreatitis status post cholecystectomy 2019 with no alcohol use for many years who presented to the ED with persistent abdominal pain localized to the epigastrium region with radiation to the back associated with nausea, vomiting and diarrhea.  Patient denies any hematemesis.  Patient unable to keep any oral intake down with progressive worsening of pain.  Denies shortness of breath or chest pain.  In the ER patient was tachycardic with labs showing elevated lipase of 339 AST 89 ALT 57 CBC shows microcytic picture. Total bilirubin 1.3. CT abdomen pelvis shows features concerning for pancreatitis EKG shows normal sinus rhythm. Patient started on IV fluids pain relief medication admitted for acute pancreatitis. Cause of which is not clear.  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  No recent alcohol use for many years, previous cholecystectomy 2019.  Known pancreatic divisum.  Lipase elevated to 339 on admission.  CT abdomen/pelvis notable for acute pancreatitis with no abscess or pseudocyst and no intrahepatic biliary dilation. [facility] was consulted and followed during hospital course.  Patient's lipase  improved from a high of 3 39-97.  Patient was initially kept n.p.o. with aggressive IV fluid hydration with slow advancement of his diet to low-fat.  Patient continued on Creon 36,000 units twice daily with meals and as needed with snacks.  Symptoms now have remarkably improved and will discharge home to maintain a low-fat diet with oxycodone as needed for further pain control.  Patient will need to follow-up with his primary gastroenterologist closely following discharge.    Essential hypertension Continue telmisartan 40 mg p.o. daily   Discharge Diagnoses:  Principal Problem:   Acute pancreatitis Active Problems:   Essential hypertension   Congenital single kidney    Discharge Instructions  Discharge Instructions    Call MD for:  difficulty breathing, headache or visual disturbances   Complete by: As directed    Call MD for:  extreme fatigue   Complete by: As directed    Call MD for:  persistant dizziness or light-headedness   Complete by: As directed    Call MD for:  persistant nausea and vomiting   Complete by: As directed    Call MD for:  severe uncontrolled pain   Complete by: As directed    Call MD for:  temperature >100.4   Complete by: As directed    Diet - low sodium heart healthy   Complete by: As directed    Increase activity slowly   Complete by: As directed      Allergies as of 08/28/2019      Reactions   Diclofenac Hives   Norvasc [amlodipine Besylate] Other (See Comments)   Fluid buildup in chest   Omeprazole Other (See Comments)   MD stopped due to pancreatitis   Prednisone Other (See Comments)   Mood swings      Medication List  STOP taking these medications   oxyCODONE-acetaminophen 5-325 MG tablet Commonly known as: PERCOCET/ROXICET     TAKE these medications   acetaminophen 325 MG tablet Commonly known as: TYLENOL Take 1-2 tablets by mouth 3 (three) times daily as needed for moderate pain or headache.   diphenhydrAMINE 25 MG  tablet Commonly known as: BENADRYL Take 25 mg by mouth daily as needed for itching or allergies.   ibuprofen 800 MG tablet Commonly known as: ADVIL Take 1 tablet (800 mg total) by mouth every 8 (eight) hours as needed.   lipase/protease/amylase 52841 UNITS Cpep capsule Commonly known as: Creon Take 1 capsule (36,000 Units total) by mouth See admin instructions. Take one capsules (36,000 units) by mouth with meals (2 meals daily) and one capsule (36000 units) with snacks daily What changed: additional instructions   ondansetron 4 MG disintegrating tablet Commonly known as: Zofran ODT Take 1 tablet (4 mg total) by mouth every 8 (eight) hours as needed for nausea or vomiting.   oxyCODONE 5 MG immediate release tablet Commonly known as: Oxy IR/ROXICODONE Take 1 tablet (5 mg total) by mouth every 6 (six) hours as needed for up to 7 days for moderate pain.   telmisartan 40 MG tablet Commonly known as: MICARDIS Take 40 mg by mouth daily.   tetrahydrozoline 0.05 % ophthalmic solution Place 1 drop into both eyes daily as needed (Dry eyes).      Follow-up Information    [name], [place]. Schedule an appointment as soon as possible for a visit in 1 week(s).   Contact information: [facility] [address] [place] [address] [phone]        Napoleon Form, MD. Schedule an appointment as soon as possible for a visit in 1 week(s).   Specialty: Gastroenterology Contact information: [address] [place] [address] [phone]          Allergies  Allergen Reactions  . Diclofenac Hives  . Norvasc [Amlodipine Besylate] Other (See Comments)    Fluid buildup in chest  . Omeprazole Other (See Comments)    MD stopped due to pancreatitis  . Prednisone Other (See Comments)    Mood swings    Consultations:  [facility]   Procedures/Studies: CT Head Wo Contrast  Result Date: 08/18/2019 CLINICAL DATA:  Seizure EXAM: CT  HEAD WITHOUT CONTRAST TECHNIQUE: Contiguous axial images were obtained from the base of the skull through the vertex without intravenous contrast. COMPARISON:  12/29/2013 FINDINGS: Brain: No acute infarct or hemorrhage. Lateral ventricles and midline structures are unremarkable. No acute extra-axial fluid collections. No mass effect. Vascular: No hyperdense vessel or unexpected calcification. Skull: Normal. Negative for fracture or focal lesion. Sinuses/Orbits: No acute finding. Other: None. IMPRESSION: 1. No acute intracranial process. Electronically Signed   By: Sharlet Salina M.D.   On: 08/18/2019 20:54   CT ABDOMEN PELVIS W CONTRAST  Result Date: 08/20/2019 CLINICAL DATA:  49 year old male with abdominal pain. Concern for acute pancreatitis. EXAM: CT ABDOMEN AND PELVIS WITH CONTRAST TECHNIQUE: Multidetector CT imaging of the abdomen and pelvis was performed using the standard protocol following bolus administration of intravenous contrast. CONTRAST:  OMNIPAQUE IOHEXOL 300 MG/ML  SOLN COMPARISON:  CT abdomen pelvis dated 02/04/2019. FINDINGS: Lower chest: The visualized lung bases are clear. No intra-abdominal free air or free fluid. Hepatobiliary: Diffuse fatty infiltration of the liver. No intrahepatic biliary ductal dilatation. Cholecystectomy. Pancreas: There is diffuse inflammatory changes of the pancreas consistent with acute pancreatitis. There is a  4 mm calcific focus in the region of the uncinate process of the pancreas, likely sequela of prior pancreatitis. There is no drainable fluid collection/abscess or pseudocyst. Spleen: Normal in size without focal abnormality. Adrenals/Urinary Tract: The adrenal glands are unremarkable. The right kidney is atrophic. Small right renal cystic lesions are too small to characterize but appears similar to prior CT. Two adjacent nonobstructing stones noted in the inferior pole of the left kidney each measuring approximately 6 mm. No hydronephrosis. The left  ureter and urinary bladder appear unremarkable. Stomach/Bowel: Several small scattered sigmoid diverticula without active inflammatory changes. There is no bowel obstruction or active inflammation. The appendix is normal. Vascular/Lymphatic: Mild aortoiliac atherosclerotic disease. The IVC is unremarkable. No portal venous gas. There is no adenopathy. Reproductive: The prostate and seminal vesicles are grossly unremarkable. Other: None Musculoskeletal: Degenerative changes at L5-S1 with disc desiccation and vacuum phenomena. No acute osseous pathology. IMPRESSION: 1. Acute pancreatitis. No abscess or pseudocyst. 2. Fatty liver. 3. No bowel obstruction. Normal appendix. 4. Aortic Atherosclerosis (ICD10-I70.0). Electronically Signed   By: Elgie Collard M.D.   On: 08/20/2019 21:21      Subjective: Patient seen and examined bedside, resting comfortably in bed.  Tolerating advance diet with minimal nausea and abdominal pain.  Ready for discharge home.  No other complaints or concerns at this time.  Denies headache, no fever/chills/night sweats, no vomiting/diarrhea, no chest pain, no palpitations, no shortness of breath, no weakness, no fatigue.  No acute events overnight per nursing staff.  Discharge Exam: Vitals:   08/27/19 2127 08/28/19 0625  BP: (!) 153/99 137/90  Pulse: 67 67  Resp: 18 18  Temp: 97.8 F (36.6 C) 98.9 F (37.2 C)  SpO2: 100% 97%   Vitals:   08/27/19 0419 08/27/19 1401 08/27/19 2127 08/28/19 0625  BP: 134/77 (!) 143/98 (!) 153/99 137/90  Pulse: (!) 59 71 67 67  Resp: 17 18 18 18   Temp: 98.4 F (36.9 C) 98.3 F (36.8 C) 97.8 F (36.6 C) 98.9 F (37.2 C)  TempSrc: Oral Oral Oral Oral  SpO2: 97% 98% 100% 97%  Weight:      Height:        General: Pt is alert, awake, not in acute distress Cardiovascular: RRR, S1/S2 +, no rubs, no gallops Respiratory: CTA bilaterally, no wheezing, no rhonchi Abdominal: Soft, NT, ND, bowel sounds + Extremities: no edema, no  cyanosis    The results of significant diagnostics from this hospitalization (including imaging, microbiology, ancillary and laboratory) are listed below for reference.     Microbiology: Recent Results (from the past 240 hour(s))  Respiratory Panel by RT PCR (Flu A&B, Covid) - Nasopharyngeal Swab     Status: None   Collection Time: 08/21/19  5:39 AM   Specimen: Nasopharyngeal Swab  Result Value Ref Range Status   SARS Coronavirus 2 by RT PCR NEGATIVE NEGATIVE Final    Comment: (NOTE) SARS-CoV-2 target nucleic acids are NOT DETECTED. The SARS-CoV-2 RNA is generally detectable in upper respiratoy specimens during the acute phase of infection. The lowest concentration of SARS-CoV-2 viral copies this assay can detect is 131 copies/mL. A negative result does not preclude SARS-Cov-2 infection and should not be used as the sole basis for treatment or other patient management decisions. A negative result may occur with  improper specimen collection/handling, submission of specimen other than nasopharyngeal swab, presence of viral mutation(s) within the areas targeted by this assay, and inadequate number of viral copies (<131 copies/mL). A negative result must  be combined with clinical observations, patient history, and epidemiological information. The expected result is Negative. Fact Sheet for Patients:  https://www.moore.com/ Fact Sheet for Healthcare Providers:  https://www.young.biz/ This test is not yet ap proved or cleared by the [place] FDA and  has been authorized for detection and/or diagnosis of SARS-CoV-2 by FDA under an Emergency Use Authorization (EUA). This EUA will remain  in effect (meaning this test can be used) for the duration of the COVID-19 declaration under Section 564(b)(1) of the Act, 21 U.S.C. section 360bbb-3(b)(1), unless the authorization is terminated or revoked sooner.    Influenza A by PCR NEGATIVE NEGATIVE  Final   Influenza B by PCR NEGATIVE NEGATIVE Final    Comment: (NOTE) The Xpert Xpress SARS-CoV-2/FLU/RSV assay is intended as an aid in  the diagnosis of influenza from Nasopharyngeal swab specimens and  should not be used as a sole basis for treatment. Nasal washings and  aspirates are unacceptable for Xpert Xpress SARS-CoV-2/FLU/RSV  testing. Fact Sheet for Patients: https://www.moore.com/ Fact Sheet for Healthcare Providers: https://www.young.biz/ This test is not yet approved or cleared by the [place] FDA and  has been authorized for detection and/or diagnosis of SARS-CoV-2 by  FDA under an Emergency Use Authorization (EUA). This EUA will remain  in effect (meaning this test can be used) for the duration of the  Covid-19 declaration under Section 564(b)(1) of the Act, 21  U.S.C. section 360bbb-3(b)(1), unless the authorization is  terminated or revoked. Performed at [facility], [address], [place], [place] [address]      Labs: BNP (last 3 results) No results for input(s): BNP in the last 8760 hours. Basic Metabolic Panel: Recent Labs  Lab 08/23/19 0153 08/24/19 0308 08/25/19 0253 08/26/19 0243 08/27/19 0109  NA 136 138 138 139 139  K 3.8 3.6 4.0 3.5 3.8  CL 99 101 106 104 105  CO2 27 25 27 27 26   GLUCOSE 99 110* 110* 100* 109*  BUN <5* <5* <5* <5* <5*  CREATININE 1.09 1.09 1.17 1.06 1.10  CALCIUM 8.8* 9.0 8.8* 9.6 9.0  MG  --   --   --   --  1.6*   Liver Function Tests: Recent Labs  Lab 08/22/19 0447 08/23/19 0153 08/24/19 0308 08/25/19 0253 08/26/19 0243  AST 42* 35 55* 52* 69*  ALT 32 30 37 40 57*  ALKPHOS 58 59 74 70 82  BILITOT 1.0 1.0 0.6 0.3 0.6  PROT 6.1* 6.0* 6.6 6.2* 7.0  ALBUMIN 2.8* 2.6* 3.0* 2.8* 3.2*   Recent Labs  Lab 08/22/19 0447 08/23/19 0153 08/24/19 0308 08/25/19 0253  LIPASE 104* 68* 84* 97*   No results for input(s): AMMONIA in the last 168 hours. CBC: No results for  input(s): WBC, NEUTROABS, HGB, HCT, MCV, PLT in the last 168 hours. Cardiac Enzymes: No results for input(s): CKTOTAL, CKMB, CKMBINDEX, TROPONINI in the last 168 hours. BNP: Invalid input(s): POCBNP CBG: Recent Labs  Lab 08/26/19 1643 08/27/19 0053 08/27/19 0807 08/27/19 1653 08/28/19 0741  GLUCAP 108* 124* 115* 101* 119*   D-Dimer No results for input(s): DDIMER in the last 72 hours. Hgb A1c No results for input(s): HGBA1C in the last 72 hours. Lipid Profile No results for input(s): CHOL, HDL, LDLCALC, TRIG, CHOLHDL, LDLDIRECT in the last 72 hours. Thyroid function studies No results for input(s): TSH, T4TOTAL, T3FREE, THYROIDAB in the last 72 hours.  Invalid input(s): FREET3 Anemia work up No results for input(s): VITAMINB12, FOLATE, FERRITIN, TIBC, IRON, RETICCTPCT  in the last 72 hours. Urinalysis    Component Value Date/Time   COLORURINE STRAW (A) 05/27/2019 1225   APPEARANCEUR CLEAR 05/27/2019 1225   LABSPEC 1.009 05/27/2019 1225   PHURINE 5.0 05/27/2019 1225   GLUCOSEU NEGATIVE 05/27/2019 1225   HGBUR NEGATIVE 05/27/2019 1225   BILIRUBINUR NEGATIVE 05/27/2019 1225   KETONESUR NEGATIVE 05/27/2019 1225   PROTEINUR NEGATIVE 05/27/2019 1225   UROBILINOGEN 0.2 01/20/2015 1643   NITRITE NEGATIVE 05/27/2019 1225   LEUKOCYTESUR NEGATIVE 05/27/2019 1225   Sepsis Labs Invalid input(s): PROCALCITONIN,  WBC,  LACTICIDVEN Microbiology Recent Results (from the past 240 hour(s))  Respiratory Panel by RT PCR (Flu A&B, Covid) - Nasopharyngeal Swab     Status: None   Collection Time: 08/21/19  5:39 AM   Specimen: Nasopharyngeal Swab  Result Value Ref Range Status   SARS Coronavirus 2 by RT PCR NEGATIVE NEGATIVE Final    Comment: (NOTE) SARS-CoV-2 target nucleic acids are NOT DETECTED. The SARS-CoV-2 RNA is generally detectable in upper respiratoy specimens during the acute phase of infection. The lowest concentration of SARS-CoV-2 viral copies this assay can detect is 131  copies/mL. A negative result does not preclude SARS-Cov-2 infection and should not be used as the sole basis for treatment or other patient management decisions. A negative result may occur with  improper specimen collection/handling, submission of specimen other than nasopharyngeal swab, presence of viral mutation(s) within the areas targeted by this assay, and inadequate number of viral copies (<131 copies/mL). A negative result must be combined with clinical observations, patient history, and epidemiological information. The expected result is Negative. Fact Sheet for Patients:  https://www.moore.com/ Fact Sheet for Healthcare Providers:  https://www.young.biz/ This test is not yet ap proved or cleared by the [place] FDA and  has been authorized for detection and/or diagnosis of SARS-CoV-2 by FDA under an Emergency Use Authorization (EUA). This EUA will remain  in effect (meaning this test can be used) for the duration of the COVID-19 declaration under Section 564(b)(1) of the Act, 21 U.S.C. section 360bbb-3(b)(1), unless the authorization is terminated or revoked sooner.    Influenza A by PCR NEGATIVE NEGATIVE Final   Influenza B by PCR NEGATIVE NEGATIVE Final    Comment: (NOTE) The Xpert Xpress SARS-CoV-2/FLU/RSV assay is intended as an aid in  the diagnosis of influenza from Nasopharyngeal swab specimens and  should not be used as a sole basis for treatment. Nasal washings and  aspirates are unacceptable for Xpert Xpress SARS-CoV-2/FLU/RSV  testing. Fact Sheet for Patients: https://www.moore.com/ Fact Sheet for Healthcare Providers: https://www.young.biz/ This test is not yet approved or cleared by the [place] FDA and  has been authorized for detection and/or diagnosis of SARS-CoV-2 by  FDA under an Emergency Use Authorization (EUA). This EUA will remain  in effect (meaning this test can  be used) for the duration of the  Covid-19 declaration under Section 564(b)(1) of the Act, 21  U.S.C. section 360bbb-3(b)(1), unless the authorization is  terminated or revoked. Performed at [facility], [address], [place], [place] [address]      Time coordinating discharge: Over 30 minutes  SIGNED:   Alvira Philips [place], DO  [facility] 08/28/2019, 11:06 AM

## 2019-08-28 NOTE — Discharge Instructions (Signed)
Acute Pancreatitis  Acute pancreatitis happens when the pancreas gets swollen. The pancreas is a large gland in the body that helps to control blood sugar. It also makes enzymes that help to digest food. This condition can last a few days and cause serious problems. The lungs, heart, and kidneys may stop working. What are the causes? Causes include:  Alcohol abuse.  Drug abuse.  Gallstones.  A tumor in the pancreas. Other causes include:  Some medicines.  Some chemicals.  Diabetes.  An infection.  Damage caused by an accident.  The poison (venom) from a scorpion bite.  Belly (abdominal) surgery.  The body's defense system (immune system) attacking the pancreas (autoimmune pancreatitis).  Genes that are passed from parent to child (inherited). In some cases, the cause is not known. What are the signs or symptoms?  Pain in the upper belly that may be felt in the back. The pain may be very bad.  Swelling of the belly.  Feeling sick to your stomach (nauseous) and throwing up (vomiting).  Fever. How is this treated? You will likely have to stay in the hospital. Treatment may include:  Pain medicine.  Fluid through an IV tube.  Placing a tube in the stomach to take out the stomach contents. This may help you stop throwing up.  Not eating for 3-4 days.  Antibiotic medicines, if you have an infection.  Treating any other problems that may be the cause.  Steroid medicines, if your problem is caused by your defense system attacking your body's own tissues.  Surgery. Follow these instructions at home: Eating and drinking   Follow instructions from your doctor about what to eat and drink.  Eat foods that do not have a lot of fat in them.  Eat small meals often. Do not eat big meals.  Drink enough fluid to keep your pee (urine) pale yellow.  Do not drink alcohol if it caused your condition. Medicines  Take over-the-counter and prescription medicines only  as told by your doctor.  Ask your doctor if the medicine prescribed to you: ? Requires you to avoid driving or using heavy machinery. ? Can cause trouble pooping (constipation). You may need to take steps to prevent or treat trouble pooping:  Take over-the-counter or prescription medicines.  Eat foods that are high in fiber. These include beans, whole grains, and fresh fruits and vegetables.  Limit foods that are high in fat and sugar. These include fried or sweet foods. General instructions  Do not use any products that contain nicotine or tobacco, such as cigarettes, e-cigarettes, and chewing tobacco. If you need help quitting, ask your doctor.  Get plenty of rest.  Check your blood sugar at home as told by your doctor.  Keep all follow-up visits as told by your doctor. This is important. Contact a doctor if:  You do not get better as quickly as expected.  You have new symptoms.  Your symptoms get worse.  You have pain or weakness that lasts a long time.  You keep feeling sick to your stomach.  You get better and then you have pain again.  You have a fever. Get help right away if:  You cannot eat or keep fluids down.  Your pain gets very bad.  Your skin or the white part of your eyes turns yellow.  You have sudden swelling in your belly.  You throw up.  You feel dizzy or you pass out (faint).  Your blood sugar is high (over 300  mg/dL). Summary  Acute pancreatitis happens when the pancreas gets swollen.  This condition is often caused by alcohol abuse, drug abuse, or gallstones.  You will likely have to stay in the hospital for treatment. This information is not intended to replace advice given to you by your health care provider. Make sure you discuss any questions you have with your health care provider. Document Revised: 01/20/2018 Document Reviewed: 01/20/2018 Elsevier Patient Education  2020 [employer].   Pancreatitis Eating Plan Pancreatitis is  when your pancreas becomes irritated and swollen (inflamed). The pancreas is a small organ located behind your stomach. It helps your body digest food and regulate your blood sugar. Pancreatitis can affect how your body digests food, especially foods with fat. You may also have other symptoms such as abdominal pain or nausea. When you have pancreatitis, following a low-fat eating plan may help you manage symptoms and recover more quickly. Work with your health care provider or a diet and nutrition specialist (dietitian) to create an eating plan that is right for you. What are tips for following this plan? Reading food labels Use the information on food labels to help keep track of how much fat you eat:  Check the serving size.  Look for the amount of total fat in grams (g) in one serving. ? Low-fat foods have 3 g of fat or less per serving. ? Fat-free foods have 0.5 g of fat or less per serving.  Keep track of how much fat you eat based on how many servings you eat. ? For example, if you eat two servings, the amount of fat you eat will be two times what is listed on the label. Shopping   Buy low-fat or nonfat foods, such as: ? Fresh, frozen, or canned fruits and vegetables. ? Grains, including pasta, bread, and rice. ? Lean meat, poultry, fish, and other protein foods. ? Low-fat or nonfat dairy.  Avoid buying bakery products and other sweets made with whole milk, butter, and eggs.  Avoid buying snack foods with added fat, such as anything with butter or cheese flavoring. Cooking  Remove skin from poultry, and remove extra fat from meat.  Limit the amount of fat and oil you use to 6 teaspoons or less per day.  Cook using low-fat methods, such as boiling, broiling, grilling, steaming, or baking.  Use spray oil to cook. Add fat-free chicken broth to add flavor and moisture.  Avoid adding cream to thicken soups or sauces. Use other thickeners such as corn starch or tomato paste. Meal  planning   Eat a low-fat diet as told by your dietitian. For most people, this means having no more than 55-65 grams of fat each day.  Eat small, frequent meals throughout the day. For example, you may have 5-6 small meals instead of 3 large meals.  Drink enough fluid to keep your urine pale yellow.  Do not drink alcohol. Talk to your health care provider if you need help stopping.  Limit how much caffeine you have, including black coffee, black and green tea, caffeinated soft drinks, and energy drinks. General information  Let your health care provider or dietitian know if you have unplanned weight loss on this eating plan.  You may be instructed to follow a clear liquid diet during a flare of symptoms. Talk with your health care provider about how to manage your diet during symptoms of a flare.  Take any vitamins or supplements as told by your health care provider.  Work  with a dietitian, especially if you have other conditions such as obesity or diabetes mellitus. What foods should I avoid? Fruits Fried fruits. Fruits served with butter or cream. Vegetables Fried vegetables. Vegetables cooked with butter, cheese, or cream. Grains Biscuits, waffles, donuts, pastries, and croissants. Pies and cookies. Butter-flavored popcorn. Regular crackers. Meats and other protein foods Fatty cuts of meat. Poultry with skin. Organ meats. Bacon, sausage, and cold cuts. Whole eggs. Nuts and nut butters. Dairy Whole and 2% milk. Whole milk yogurt. Whole milk ice cream. Cream and half-and-half. Cream cheese. Sour cream. Cheese. Beverages Wine, beer, and liquor. The items listed above may not be a complete list of foods and beverages to avoid. Contact a dietitian for more information. Summary  Pancreatitis can affect how your body digests food, especially foods with fat.  When you have pancreatitis, it is recommended that you follow a low-fat eating plan to help you recover more quickly and  manage symptoms. For most people, this means limiting fat to no more than 55-65 grams per day.  Do not drink alcohol. Limit the amount of caffeine you have, and drink enough fluid to keep your urine pale yellow. This information is not intended to replace advice given to you by your health care provider. Make sure you discuss any questions you have with your health care provider. Document Revised: 07/24/2018 Document Reviewed: 07/09/2017 Elsevier Patient Education  2020 [employer] Inc.  

## 2019-08-28 NOTE — Plan of Care (Signed)
  Problem: Education: Goal: Knowledge of General Education information will improve Description Including pain rating scale, medication(s)/side effects and non-pharmacologic comfort measures Outcome: Progressing   

## 2019-09-07 ENCOUNTER — Ambulatory Visit (INDEPENDENT_AMBULATORY_CARE_PROVIDER_SITE_OTHER): Payer: Managed Care, Other (non HMO) | Admitting: Gastroenterology

## 2019-09-07 ENCOUNTER — Other Ambulatory Visit: Payer: Self-pay

## 2019-09-07 ENCOUNTER — Encounter: Payer: Self-pay | Admitting: Gastroenterology

## 2019-09-07 VITALS — BP 132/86 | HR 108 | Ht 72.75 in | Wt 165.2 lb

## 2019-09-07 DIAGNOSIS — K8689 Other specified diseases of pancreas: Secondary | ICD-10-CM

## 2019-09-07 DIAGNOSIS — R630 Anorexia: Secondary | ICD-10-CM | POA: Diagnosis not present

## 2019-09-07 DIAGNOSIS — K859 Acute pancreatitis without necrosis or infection, unspecified: Secondary | ICD-10-CM | POA: Diagnosis not present

## 2019-09-07 DIAGNOSIS — R1010 Upper abdominal pain, unspecified: Secondary | ICD-10-CM

## 2019-09-07 DIAGNOSIS — R112 Nausea with vomiting, unspecified: Secondary | ICD-10-CM | POA: Diagnosis not present

## 2019-09-07 DIAGNOSIS — R634 Abnormal weight loss: Secondary | ICD-10-CM

## 2019-09-07 MED ORDER — PANCRELIPASE (LIP-PROT-AMYL) 36000-114000 UNITS PO CPEP: 36000.0000 [IU] | ORAL_CAPSULE | ORAL | 6 refills | Status: DC

## 2019-09-07 MED ORDER — NA SULFATE-K SULFATE-MG SULF 17.5-3.13-1.6 GM/177ML PO SOLN
1.0000 | Freq: Once | ORAL | 0 refills | Status: AC
Start: 2019-09-07 — End: 2019-09-07

## 2019-09-07 NOTE — Progress Notes (Signed)
Kyle Berry    [id number]    [birth year]  Primary Care Physician:[name], Brayton Layman (Inactive)  Referring Physician: No referring provider defined for this encounter.   Chief complaint: Recurrent pancreatitis  HPI:  49 year old male with history of recurrent pancreatitis s/p empiric cholecystectomy in July 2019.  He has normal IgG4, negative for autoimmune pancreatitis. Multiple admissions with acute pancreatitis, most recent May 11-13, 2021.  He continues to have abdominal discomfort, nausea and anorexia.  He has lost significant weight in the past few years.  10/2016 MRCP showed acute pancreatitis, suspicion for pancreas divisum, no evidence of chronic pancreatitis. 08/2016 MRI/MRCP raised question of chronic pancreatitis with focally dilated portion of dorsal pancreatic duct and fused mild irregularity of pancreatic duct.  Outpatient Encounter Medications as of 09/07/2019  Medication Sig  . acetaminophen (TYLENOL) 325 MG tablet Take 1-2 tablets by mouth 3 (three) times daily as needed for moderate pain or headache.  . diphenhydrAMINE (BENADRYL) 25 MG tablet Take 25 mg by mouth daily as needed for itching or allergies.  Marland Kitchen ibuprofen (ADVIL) 800 MG tablet Take 1 tablet (800 mg total) by mouth every 8 (eight) hours as needed.  . lipase/protease/amylase (CREON) 36000 UNITS CPEP capsule Take 1 capsule (36,000 Units total) by mouth See admin instructions. Take one capsules (36,000 units) by mouth with meals (2 meals daily) and one capsule (36000 units) with snacks daily (Patient taking differently: Take 36,000 Units by mouth See admin instructions. Take 1 capsule by mouth twice daily with meals and 1 capsule with snacks daily)  . ondansetron (ZOFRAN ODT) 4 MG disintegrating tablet Take 1 tablet (4 mg total) by mouth every 8 (eight) hours as needed for nausea or vomiting.  Marland Kitchen telmisartan (MICARDIS) 40 MG tablet Take 40 mg by mouth daily.  Marland Kitchen tetrahydrozoline 0.05 %  ophthalmic solution Place 1 drop into both eyes daily as needed (Dry eyes).   No facility-administered encounter medications on file as of 09/07/2019.    Allergies as of 09/07/2019 - Review Complete 09/07/2019  Allergen Reaction Noted  . Diclofenac Hives 12/14/2013  . Norvasc [amlodipine besylate] Other (See Comments) 08/15/2016  . Omeprazole Other (See Comments) 08/15/2016  . Prednisone Other (See Comments) 12/14/2013    Past Medical History:  Diagnosis Date  . Acute pancreatitis 11/23/2015  . Chronic bronchitis ([place])   . Chronic lower back pain   . Congenital single kidney   . GERD (gastroesophageal reflux disease)   . Headache    "once/month" (05/13/2017)  . High cholesterol   . History of kidney stones   . Hypertension   . Migraine    "a couple/year" (05/13/2017)  . Nephrolithiasis 05/13/2017  . Pneumonia ~ 09/2015  . Recurrent acute pancreatitis   . Renal disorder     Past Surgical History:  Procedure Laterality Date  . CHOLECYSTECTOMY N/A 10/23/2017   Procedure: LAPAROSCOPIC CHOLECYSTECTOMY WITH INTRAOPERATIVE CHOLANGIOGRAM;  Surgeon: Johnathan Hausen, MD;  Location: [facility];  Service: General;  Laterality: N/A;  . NO PAST SURGERIES      Family History  Problem Relation Age of Onset  . Hypertension Other   . Hypertension Mother   . Hypertension Father   . Kidney disease Father   . Hypertension Sister   . Diabetes Sister   . Hypertension Brother     Social History   Socioeconomic History  . Marital status: Single    Spouse name: Not on file  . Number of children: Not on  file  . Years of education: Not on file  . Highest education level: Not on file  Occupational History  . Not on file  Tobacco Use  . Smoking status: Never Smoker  . Smokeless tobacco: Never Used  Substance and Sexual Activity  . Alcohol use: Not Currently    Comment: 05/13/2017 "might have beer a few times/year"  . Drug use: No  . Sexual activity: Yes    Birth control/protection: Condom   Other Topics Concern  . Not on file  Social History Narrative  . Not on file   Social Determinants of Health   Financial Resource Strain:   . Difficulty of Paying Living Expenses:   Food Insecurity:   . Worried About Charity fundraiser in the Last Year:   . Arboriculturist in the Last Year:   Transportation Needs:   . Film/video editor (Medical):   Marland Kitchen Lack of Transportation (Non-Medical):   Physical Activity:   . Days of Exercise per Week:   . Minutes of Exercise per Session:   Stress:   . Feeling of Stress :   Social Connections:   . Frequency of Communication with Friends and Family:   . Frequency of Social Gatherings with Friends and Family:   . Attends Religious Services:   . Active Member of Clubs or Organizations:   . Attends Archivist Meetings:   Marland Kitchen Marital Status:   Intimate Partner Violence:   . Fear of Current or Ex-Partner:   . Emotionally Abused:   Marland Kitchen Physically Abused:   . Sexually Abused:       Review of systems: All other review of systems negative except as mentioned in the HPI.   Physical Exam: Vitals:   09/07/19 0955  BP: 132/86  Pulse: (!) 108   Body mass index is 21.95 kg/m. Gen:      No acute distress HEENT:   sclera anicteric Abd:       soft, non-tender; no palpable masses, no distension Ext:    No edema; adequate peripheral perfusion Neuro: alert and oriented x 3 Psych: normal mood and affect  Data Reviewed:  Reviewed labs, radiology imaging, old records and pertinent past GI work up   Assessment and Plan/Recommendations:  49 year old very pleasant male with history of recurrent idiopathic pancreatitis multiple ER visits and hospitalization.  S/p cholecystectomy, normal triglycerides and normal IgG4 ?  Possible pancreas divisum as etiology for recurrent pancreatitis Will repeat imaging in 6 to 8 weeks once acute inflammation subsides.  Based on imaging findings, will discuss with Dr. Mansouraty/Dr. Ardis Hughs if he  will benefit from minor duct sphincterotomy  Slowly advance diet as tolerated with high-protein low-fat diet  Pancreatic insufficiency: Continue Creon 3 times daily with meals and snacks  He has persistent anorexia, nausea and progressive weight loss.  Schedule for EGD to exclude peptic ulcer disease or any other upper GI etiology  He is due for colorectal cancer screening, will schedule colonoscopy The risks and benefits as well as alternatives of endoscopic procedure(s) have been discussed and reviewed. All questions answered. The patient agrees to proceed.   The patient was provided an opportunity to ask questions and all were answered. The patient agreed with the plan and demonstrated an understanding of the instructions.  Damaris Hippo , MD    CC: No ref. provider found

## 2019-09-07 NOTE — Patient Instructions (Signed)
We have sent the following medications to your pharmacy for you to pick up at your convenience:  Creon  Increase fluid intake  Try Carnation Instant Breakfast twice a day.  You have been scheduled for a colonoscopy. Please follow written instructions given to you at your visit today.  Please pick up your prep supplies at the pharmacy within the next 1-3 days. If you use inhalers (even only as needed), please bring them with you on the day of your procedure. Your physician has requested that you go to www.startemmi.com and enter the access code given to you at your visit today. This web site gives a general overview about your procedure. However, you should still follow specific instructions given to you by our office regarding your preparation for the procedure.

## 2019-09-08 ENCOUNTER — Encounter (HOSPITAL_COMMUNITY): Payer: Self-pay | Admitting: Emergency Medicine

## 2019-09-08 ENCOUNTER — Emergency Department (HOSPITAL_COMMUNITY)
Admission: EM | Admit: 2019-09-08 | Discharge: 2019-09-09 | Disposition: A | Discharge status: Home / Self Care | Payer: Managed Care, Other (non HMO) | Attending: Emergency Medicine | Admitting: Emergency Medicine

## 2019-09-08 ENCOUNTER — Other Ambulatory Visit: Payer: Self-pay

## 2019-09-08 DIAGNOSIS — I10 Essential (primary) hypertension: Secondary | ICD-10-CM | POA: Diagnosis not present

## 2019-09-08 DIAGNOSIS — R10816 Epigastric abdominal tenderness: Secondary | ICD-10-CM | POA: Diagnosis not present

## 2019-09-08 DIAGNOSIS — R112 Nausea with vomiting, unspecified: Secondary | ICD-10-CM | POA: Diagnosis present

## 2019-09-08 LAB — CBC
HCT: 40.1 % (ref 39.0–52.0)
Hemoglobin: 13.2 g/dL (ref 13.0–17.0)
MCH: 32.2 pg (ref 26.0–34.0)
MCHC: 32.9 g/dL (ref 30.0–36.0)
MCV: 97.8 fL (ref 80.0–100.0)
Platelets: 286 10*3/uL (ref 150–400)
RBC: 4.1 MIL/uL — ABNORMAL LOW (ref 4.22–5.81)
RDW: 14.4 % (ref 11.5–15.5)
WBC: 5.3 10*3/uL (ref 4.0–10.5)
nRBC: 0 % (ref 0.0–0.2)

## 2019-09-08 LAB — URINALYSIS, ROUTINE W REFLEX MICROSCOPIC
Bilirubin Urine: NEGATIVE
Glucose, UA: NEGATIVE mg/dL
Ketones, ur: NEGATIVE mg/dL
Nitrite: NEGATIVE
Protein, ur: 30 mg/dL — AB
Specific Gravity, Urine: 1.023 (ref 1.005–1.030)
pH: 5 (ref 5.0–8.0)

## 2019-09-08 LAB — COMPREHENSIVE METABOLIC PANEL
ALT: 49 U/L — ABNORMAL HIGH (ref 0–44)
AST: 66 U/L — ABNORMAL HIGH (ref 15–41)
Albumin: 4 g/dL (ref 3.5–5.0)
Alkaline Phosphatase: 63 U/L (ref 38–126)
Anion gap: 14 (ref 5–15)
BUN: 10 mg/dL (ref 6–20)
CO2: 23 mmol/L (ref 22–32)
Calcium: 9.2 mg/dL (ref 8.9–10.3)
Chloride: 104 mmol/L (ref 98–111)
Creatinine, Ser: 1.17 mg/dL (ref 0.61–1.24)
GFR calc Af Amer: >60 mL/min (ref >60)
GFR calc non Af Amer: >60 mL/min (ref >60)
Glucose, Bld: 112 mg/dL — ABNORMAL HIGH (ref 70–99)
Potassium: 4.2 mmol/L (ref 3.5–5.1)
Sodium: 141 mmol/L (ref 135–145)
Total Bilirubin: 0.9 mg/dL (ref 0.3–1.2)
Total Protein: 7.5 g/dL (ref 6.5–8.1)

## 2019-09-08 LAB — LIPASE, BLOOD: Lipase: 43 U/L (ref 11–51)

## 2019-09-08 MED ORDER — SODIUM CHLORIDE 0.9% FLUSH: 3.0000 mL | Freq: Once | INTRAVENOUS | Status: DC

## 2019-09-08 NOTE — ED Triage Notes (Signed)
Pt c/o abdominal pain, nausea, vomiting since being d/c from the hospital for pancreatitis.

## 2019-09-09 MED ORDER — MORPHINE SULFATE (PF) 4 MG/ML IV SOLN
4.0000 mg | Freq: Once | INTRAVENOUS | Status: AC
  Administered 2019-09-09: 4 mg via INTRAVENOUS
  Filled 2019-09-09: qty 1

## 2019-09-09 MED ORDER — PROMETHAZINE HCL 25 MG PO TABS
25.0000 mg | ORAL_TABLET | Freq: Four times a day (QID) | ORAL | 0 refills | Status: DC | PRN
Start: 2019-09-09 — End: 2019-10-13

## 2019-09-09 MED ORDER — SODIUM CHLORIDE 0.9 % IV BOLUS
1000.0000 mL | Freq: Once | INTRAVENOUS | Status: AC
  Administered 2019-09-09: 1000 mL via INTRAVENOUS

## 2019-09-09 MED ORDER — PROMETHAZINE HCL 25 MG/ML IJ SOLN
25.0000 mg | Freq: Once | INTRAMUSCULAR | Status: AC
  Administered 2019-09-09: 25 mg via INTRAVENOUS
  Filled 2019-09-09: qty 1

## 2019-09-09 MED ORDER — ONDANSETRON HCL 4 MG/2ML IJ SOLN: 4.0000 mg | Freq: Once | INTRAMUSCULAR | Status: DC

## 2019-09-09 MED ORDER — MORPHINE SULFATE (PF) 4 MG/ML IV SOLN
4.0000 mg | Freq: Once | INTRAVENOUS | Status: AC
  Administered 2019-09-09: 4 mg via INTRAVENOUS
  Filled 2019-09-09 (×2): qty 1

## 2019-09-09 MED ORDER — PROCHLORPERAZINE EDISYLATE 10 MG/2ML IJ SOLN
10.0000 mg | Freq: Once | INTRAMUSCULAR | Status: AC
  Administered 2019-09-09: 10 mg via INTRAVENOUS
  Filled 2019-09-09 (×2): qty 2

## 2019-09-09 NOTE — ED Provider Notes (Signed)
[facility] EMERGENCY DEPARTMENT Provider Note   CSN: [id number] Arrival date & time: 09/08/19  1820     History Chief Complaint  Patient presents with  . Abdominal Pain    Kyle Berry is a 49 y.o. male.  Patient with history of recurrent pancreatitis presents to the emergency department with a chief complaint of nausea and vomiting.  He states that he was recently discharged from the hospital about a week ago for pancreatitis.  He reports that he did well for the first several days, but then he began having persistent nausea and vomiting again.  He states that for the past few days he has not been able to keep anything down.  He states that anything he eats or drinks comes right back up.  He reports having some hematemesis earlier today, which frightened him and prompted him to come back to the emergency department.  He states that he did call and talk with his GI office, and they also recommended that he come back to the emergency department.  He states that he is continue to have vomiting.  Reports last emesis was immediately prior to being placed in the exam room.  He states that he has been trying to stay hydrated and eat the right foods, but he cannot keep anything down.  He denies any fevers.  Denies any other associated symptoms.  The history is provided by the patient. No language interpreter was used.       Past Medical History:  Diagnosis Date  . Acute pancreatitis 11/23/2015  . Chronic bronchitis ([place])   . Chronic lower back pain   . Congenital single kidney   . GERD (gastroesophageal reflux disease)   . Headache    "once/month" (05/13/2017)  . High cholesterol   . History of kidney stones   . Hypertension   . Migraine    "a couple/year" (05/13/2017)  . Nephrolithiasis 05/13/2017  . Pneumonia ~ 09/2015  . Recurrent acute pancreatitis   . Renal disorder     Patient Active Problem List   Diagnosis Date Noted  . Pancreatic insufficiency  05/28/2019  . Recurrent acute pancreatitis 10/17/2018  . Microscopic hematuria 10/17/2018  . Acute pancreatitis 07/23/2018  . Pancreatic divisum   . Recurrent pancreatitis 05/13/2017  . Congenital single kidney 05/13/2017  . High cholesterol 05/13/2017  . Nephrolithiasis 05/13/2017  . Acute on chronic pancreatitis ([place]) 01/17/2017  . Acute recurrent pancreatitis   . Hypokalemia   . Nausea and vomiting   . Essential hypertension   . Esophageal reflux   . Chronic pancreatitis ([place]) 11/23/2015    Past Surgical History:  Procedure Laterality Date  . CHOLECYSTECTOMY N/A 10/23/2017   Procedure: LAPAROSCOPIC CHOLECYSTECTOMY WITH INTRAOPERATIVE CHOLANGIOGRAM;  Surgeon: Johnathan Hausen, MD;  Location: [facility];  Service: General;  Laterality: N/A;  . NO PAST SURGERIES         Family History  Problem Relation Age of Onset  . Hypertension Other   . Hypertension Mother   . Hypertension Father   . Kidney disease Father   . Hypertension Sister   . Diabetes Sister   . Hypertension Brother     Social History   Tobacco Use  . Smoking status: Never Smoker  . Smokeless tobacco: Never Used  Substance Use Topics  . Alcohol use: Not Currently    Comment: 05/13/2017 "might have beer a few times/year"  . Drug use: No    Home Medications Prior to Admission medications   Medication  Sig Start Date End Date Taking? Authorizing Provider  acetaminophen (TYLENOL) 325 MG tablet Take 1-2 tablets by mouth 3 (three) times daily as needed for moderate pain or headache. 08/19/19   [provider]  diphenhydrAMINE (BENADRYL) 25 MG tablet Take 25 mg by mouth daily as needed for itching or allergies.    [provider]  ibuprofen (ADVIL) 800 MG tablet Take 1 tablet (800 mg total) by mouth every 8 (eight) hours as needed. 03/12/19   Fransico Meadow, PA-C  lipase/protease/amylase (CREON) 36000 UNITS CPEP capsule Take 1 capsule (36,000 Units total) by mouth See admin instructions. Take two  capsules (36,000 units) by mouth with meals (3 meals daily) and one capsule (36000 units) with snacks daily 09/07/19   Nandigam, Venia Minks, MD  ondansetron (ZOFRAN ODT) 4 MG disintegrating tablet Take 1 tablet (4 mg total) by mouth every 8 (eight) hours as needed for nausea or vomiting. 03/12/19   Fransico Meadow, PA-C  telmisartan (MICARDIS) 40 MG tablet Take 40 mg by mouth daily. 03/05/19   [provider]  tetrahydrozoline 0.05 % ophthalmic solution Place 1 drop into both eyes daily as needed (Dry eyes).    [provider]    Allergies    Diclofenac, Norvasc [amlodipine besylate], Omeprazole, and Prednisone  Review of Systems   Review of Systems  All other systems reviewed and are negative.   Physical Exam Updated Vital Signs BP (!) 143/102 (BP Location: Right Arm)   Pulse 84   Temp 98.3 F (36.8 C) (Oral)   Resp 18   SpO2 100%   Physical Exam Vitals and nursing note reviewed.  Constitutional:      Appearance: He is well-developed.  HENT:     Head: Normocephalic and atraumatic.  Eyes:     Conjunctiva/sclera: Conjunctivae normal.  Cardiovascular:     Rate and Rhythm: Normal rate and regular rhythm.     Heart sounds: No murmur.  Pulmonary:     Effort: Pulmonary effort is normal. No respiratory distress.     Breath sounds: Normal breath sounds.  Abdominal:     Palpations: Abdomen is soft.     Tenderness: There is abdominal tenderness in the epigastric area.  Musculoskeletal:        General: Normal range of motion.     Cervical back: Neck supple.  Skin:    General: Skin is warm and dry.  Neurological:     Mental Status: He is alert and oriented to person, place, and time.  Psychiatric:        Mood and Affect: Mood normal.        Behavior: Behavior normal.     ED Results / Procedures / Treatments   Labs (all labs ordered are listed, but only abnormal results are displayed) Labs Reviewed  COMPREHENSIVE METABOLIC PANEL - Abnormal; Notable for the  following components:      Result Value   Glucose, Bld 112 (*)    AST 66 (*)    ALT 49 (*)    All other components within normal limits  CBC - Abnormal; Notable for the following components:   RBC 4.10 (*)    All other components within normal limits  URINALYSIS, ROUTINE W REFLEX MICROSCOPIC - Abnormal; Notable for the following components:   Hgb urine dipstick SMALL (*)    Protein, ur 30 (*)    Leukocytes,Ua SMALL (*)    Bacteria, UA RARE (*)    All other components within normal limits  LIPASE,  BLOOD    EKG None  Radiology No results found.  Procedures Procedures (including critical care time)  Medications Ordered in ED Medications  sodium chloride flush (NS) 0.9 % injection 3 mL (has no administration in time range)  sodium chloride 0.9 % bolus 1,000 mL (has no administration in time range)  morphine 4 MG/ML injection 4 mg (has no administration in time range)  ondansetron (ZOFRAN) injection 4 mg (has no administration in time range)    ED Course  I have reviewed the triage vital signs and the nursing notes.  Pertinent labs & imaging results that were available during my care of the patient were reviewed by me and considered in my medical decision making (see chart for details).    MDM Rules/Calculators/A&P                      This patient complains of nausea and vomiting, this involves an extensive number of treatment options, and is a complaint that carries with it a high risk of complications and morbidity.  The differential diagnosis includes recurrent pancreatitis, dehydration, viral GI illness.  Pertinent Labs I ordered, reviewed, and interpreted labs, which included CBC is normal, CMP shows mildly elevated AST and ALT at 66 and 49 respectively.  Lipase is 43, significantly improved from recent high of 339.  UA is abnormal, but no dysuria.  Medications I ordered medication morphine and phenergan for pain and nausea.  Sources Previous records obtained and  reviewed show recent admission for pancreatitis.  Consultants none  Critical Interventions  none  Reassessments After the interventions stated above, I reevaluated the patient and found feeling much improved.  Tolerating PO.  Will DC to home with antiemetic.  Final Clinical Impression(s) / ED Diagnoses Final diagnoses:  Nausea and vomiting, intractability of vomiting not specified, unspecified vomiting type    Rx / DC Orders ED Discharge Orders         Ordered    promethazine (PHENERGAN) 25 MG tablet  Every 6 hours PRN     09/09/19 0605           [name], PA-C [id number]    Delora Fuel, [place] [id number] [phone]

## 2019-09-13 ENCOUNTER — Other Ambulatory Visit: Payer: Self-pay

## 2019-09-13 ENCOUNTER — Emergency Department (HOSPITAL_COMMUNITY): Payer: Managed Care, Other (non HMO)

## 2019-09-13 ENCOUNTER — Emergency Department (HOSPITAL_COMMUNITY)
Admission: EM | Admit: 2019-09-13 | Discharge: 2019-09-14 | Disposition: A | Discharge status: Home / Self Care | Payer: Managed Care, Other (non HMO) | Attending: Emergency Medicine | Admitting: Emergency Medicine

## 2019-09-13 ENCOUNTER — Encounter (HOSPITAL_COMMUNITY): Payer: Self-pay | Admitting: Emergency Medicine

## 2019-09-13 DIAGNOSIS — Z79899 Other long term (current) drug therapy: Secondary | ICD-10-CM | POA: Diagnosis not present

## 2019-09-13 DIAGNOSIS — K920 Hematemesis: Secondary | ICD-10-CM | POA: Insufficient documentation

## 2019-09-13 DIAGNOSIS — I10 Essential (primary) hypertension: Secondary | ICD-10-CM | POA: Insufficient documentation

## 2019-09-13 DIAGNOSIS — K861 Other chronic pancreatitis: Secondary | ICD-10-CM | POA: Diagnosis not present

## 2019-09-13 DIAGNOSIS — R1010 Upper abdominal pain, unspecified: Secondary | ICD-10-CM | POA: Diagnosis present

## 2019-09-13 DIAGNOSIS — R112 Nausea with vomiting, unspecified: Secondary | ICD-10-CM | POA: Insufficient documentation

## 2019-09-13 LAB — TYPE AND SCREEN
ABO/RH(D): O POS
Antibody Screen: NEGATIVE

## 2019-09-13 LAB — CBC
HCT: 41.4 % (ref 39.0–52.0)
Hemoglobin: 13.1 g/dL (ref 13.0–17.0)
MCH: 33.2 pg (ref 26.0–34.0)
MCHC: 31.6 g/dL (ref 30.0–36.0)
MCV: 104.8 fL — ABNORMAL HIGH (ref 80.0–100.0)
Platelets: 198 10*3/uL (ref 150–400)
RBC: 3.95 MIL/uL — ABNORMAL LOW (ref 4.22–5.81)
RDW: 14.7 % (ref 11.5–15.5)
WBC: 6.5 10*3/uL (ref 4.0–10.5)
nRBC: 0 % (ref 0.0–0.2)

## 2019-09-13 LAB — BASIC METABOLIC PANEL
Anion gap: 13 (ref 5–15)
BUN: 13 mg/dL (ref 6–20)
CO2: 21 mmol/L — ABNORMAL LOW (ref 22–32)
Calcium: 8.9 mg/dL (ref 8.9–10.3)
Chloride: 104 mmol/L (ref 98–111)
Creatinine, Ser: 1.39 mg/dL — ABNORMAL HIGH (ref 0.61–1.24)
GFR calc Af Amer: >60 mL/min (ref >60)
GFR calc non Af Amer: 59 mL/min — ABNORMAL LOW (ref >60)
Glucose, Bld: 104 mg/dL — ABNORMAL HIGH (ref 70–99)
Potassium: 3.9 mmol/L (ref 3.5–5.1)
Sodium: 138 mmol/L (ref 135–145)

## 2019-09-13 LAB — ABO/RH: ABO/RH(D): O POS

## 2019-09-13 LAB — TROPONIN I (HIGH SENSITIVITY): Troponin I (High Sensitivity): <2 ng/L (ref <18)

## 2019-09-13 MED ORDER — SODIUM CHLORIDE 0.9% FLUSH: 3.0000 mL | Freq: Once | INTRAVENOUS | Status: DC

## 2019-09-13 NOTE — ED Triage Notes (Signed)
Pt c/o cp for a week and vomiting blood with sob.

## 2019-09-14 LAB — LIPASE, BLOOD: Lipase: 54 U/L — ABNORMAL HIGH (ref 11–51)

## 2019-09-14 LAB — HEPATIC FUNCTION PANEL
ALT: 47 U/L — ABNORMAL HIGH (ref 0–44)
AST: 46 U/L — ABNORMAL HIGH (ref 15–41)
Albumin: 4 g/dL (ref 3.5–5.0)
Alkaline Phosphatase: 84 U/L (ref 38–126)
Bilirubin, Direct: 0.4 mg/dL — ABNORMAL HIGH (ref 0.0–0.2)
Indirect Bilirubin: 0.7 mg/dL (ref 0.3–0.9)
Total Bilirubin: 1.1 mg/dL (ref 0.3–1.2)
Total Protein: 7.5 g/dL (ref 6.5–8.1)

## 2019-09-14 LAB — TROPONIN I (HIGH SENSITIVITY): Troponin I (High Sensitivity): <2 ng/L (ref <18)

## 2019-09-14 MED ORDER — SODIUM CHLORIDE 0.9 % IV BOLUS
1000.0000 mL | Freq: Once | INTRAVENOUS | Status: AC
  Administered 2019-09-14: 1000 mL via INTRAVENOUS

## 2019-09-14 MED ORDER — ONDANSETRON HCL 4 MG/2ML IJ SOLN
4.0000 mg | Freq: Once | INTRAMUSCULAR | Status: AC
  Administered 2019-09-14: 4 mg via INTRAVENOUS
  Filled 2019-09-14: qty 2

## 2019-09-14 MED ORDER — ALUM & MAG HYDROXIDE-SIMETH 200-200-20 MG/5ML PO SUSP
30.0000 mL | Freq: Once | ORAL | Status: AC
  Administered 2019-09-14: 30 mL via ORAL
  Filled 2019-09-14: qty 30

## 2019-09-14 MED ORDER — LIDOCAINE VISCOUS HCL 2 % MT SOLN
15.0000 mL | Freq: Once | OROMUCOSAL | Status: AC
  Administered 2019-09-14: 15 mL via ORAL
  Filled 2019-09-14: qty 15

## 2019-09-14 MED ORDER — PROMETHAZINE HCL 25 MG/ML IJ SOLN
12.5000 mg | Freq: Once | INTRAMUSCULAR | Status: AC
  Administered 2019-09-14: 12.5 mg via INTRAVENOUS
  Filled 2019-09-14: qty 1

## 2019-09-14 MED ORDER — MORPHINE SULFATE (PF) 4 MG/ML IV SOLN
4.0000 mg | Freq: Once | INTRAVENOUS | Status: AC
  Administered 2019-09-14: 4 mg via INTRAVENOUS
  Filled 2019-09-14: qty 1

## 2019-09-14 NOTE — ED Provider Notes (Signed)
[facility] EMERGENCY DEPARTMENT Provider Note   CSN: [id number] Arrival date & time: 09/13/19  2138     History Chief Complaint  Patient presents with  . Chest Pain  . Emesis    Kyle Berry is a 49 y.o. male.  Patient to ED with upper abdominal pain, vomiting, and diarrhea similar to previous recurrent pancreatitis. He denies alcohol use. Last flare up of symptoms was last week. No fever. He reports onset of vomiting earlier today with gross hematemesis that has lessened with each episode. Total of 4 emesis episodes today. Vomiting causing pain to radiate up into chest. No hematochezia.  The history is provided by the patient. No language interpreter was used.  Chest Pain Associated symptoms: abdominal pain, nausea and vomiting   Associated symptoms: no fever and no shortness of breath   Emesis Associated symptoms: abdominal pain and diarrhea   Associated symptoms: no chills and no fever        Past Medical History:  Diagnosis Date  . Acute pancreatitis 11/23/2015  . Chronic bronchitis ([place])   . Chronic lower back pain   . Congenital single kidney   . GERD (gastroesophageal reflux disease)   . Headache    "once/month" (05/13/2017)  . High cholesterol   . History of kidney stones   . Hypertension   . Migraine    "a couple/year" (05/13/2017)  . Nephrolithiasis 05/13/2017  . Pneumonia ~ 09/2015  . Recurrent acute pancreatitis   . Renal disorder     Patient Active Problem List   Diagnosis Date Noted  . Pancreatic insufficiency 05/28/2019  . Recurrent acute pancreatitis 10/17/2018  . Microscopic hematuria 10/17/2018  . Acute pancreatitis 07/23/2018  . Pancreatic divisum   . Recurrent pancreatitis 05/13/2017  . Congenital single kidney 05/13/2017  . High cholesterol 05/13/2017  . Nephrolithiasis 05/13/2017  . Acute on chronic pancreatitis ([place]) 01/17/2017  . Acute recurrent pancreatitis   . Hypokalemia   . Nausea and vomiting   .  Essential hypertension   . Esophageal reflux   . Chronic pancreatitis ([place]) 11/23/2015    Past Surgical History:  Procedure Laterality Date  . CHOLECYSTECTOMY N/A 10/23/2017   Procedure: LAPAROSCOPIC CHOLECYSTECTOMY WITH INTRAOPERATIVE CHOLANGIOGRAM;  Surgeon: Johnathan Hausen, MD;  Location: [facility] ORS;  Service: General;  Laterality: N/A;  . NO PAST SURGERIES         Family History  Problem Relation Age of Onset  . Hypertension Other   . Hypertension Mother   . Hypertension Father   . Kidney disease Father   . Hypertension Sister   . Diabetes Sister   . Hypertension Brother     Social History   Tobacco Use  . Smoking status: Never Smoker  . Smokeless tobacco: Never Used  Substance Use Topics  . Alcohol use: Not Currently    Comment: 05/13/2017 "might have beer a few times/year"  . Drug use: No    Home Medications Prior to Admission medications   Medication Sig Start Date End Date Taking? Authorizing Provider  acetaminophen (TYLENOL) 325 MG tablet Take 1-2 tablets by mouth 3 (three) times daily as needed for moderate pain or headache. 08/19/19   [provider]  diphenhydrAMINE (BENADRYL) 25 MG tablet Take 25 mg by mouth daily as needed for itching or allergies.    [provider]  ibuprofen (ADVIL) 800 MG tablet Take 1 tablet (800 mg total) by mouth every 8 (eight) hours as needed. Patient taking differently: Take 800 mg by mouth  every 8 (eight) hours as needed for moderate pain.  03/12/19   Fransico Meadow, PA-C  lipase/protease/amylase (CREON) 36000 UNITS CPEP capsule Take 1 capsule (36,000 Units total) by mouth See admin instructions. Take two capsules (36,000 units) by mouth with meals (3 meals daily) and one capsule (36000 units) with snacks daily 09/07/19   Nandigam, Venia Minks, MD  ondansetron (ZOFRAN ODT) 4 MG disintegrating tablet Take 1 tablet (4 mg total) by mouth every 8 (eight) hours as needed for nausea or vomiting. 03/12/19   Fransico Meadow, PA-C    promethazine (PHENERGAN) 25 MG tablet Take 1 tablet (25 mg total) by mouth every 6 (six) hours as needed for nausea or vomiting. 09/09/19   [name], PA-C  telmisartan (MICARDIS) 40 MG tablet Take 40 mg by mouth daily. 03/05/19   [provider]  tetrahydrozoline 0.05 % ophthalmic solution Place 1 drop into both eyes daily as needed (Dry eyes).    [provider]    Allergies    Diclofenac, Norvasc [amlodipine besylate], Omeprazole, and Prednisone  Review of Systems   Review of Systems  Constitutional: Negative for chills and fever.  HENT: Negative.   Respiratory: Negative.  Negative for shortness of breath.   Cardiovascular: Positive for chest pain.  Gastrointestinal: Positive for abdominal pain, diarrhea, nausea and vomiting.       See HPI.  Genitourinary: Negative.   Musculoskeletal: Negative.   Skin: Negative.   Neurological: Negative.     Physical Exam Updated Vital Signs BP (!) 147/104   Pulse 85   Temp 98.4 F (36.9 C) (Oral)   Resp 13   Ht 6\' 1"  (1.854 m)   Wt 75 kg   SpO2 100%   BMI 21.81 kg/m   Physical Exam Vitals and nursing note reviewed.  Constitutional:      General: He is not in acute distress.    Appearance: He is well-developed.  HENT:     Head: Normocephalic.  Cardiovascular:     Rate and Rhythm: Normal rate and regular rhythm.     Heart sounds: No murmur.  Pulmonary:     Effort: Pulmonary effort is normal.     Breath sounds: Normal breath sounds. No wheezing, rhonchi or rales.  Chest:     Chest wall: No tenderness.  Abdominal:     General: There is no distension.     Tenderness: There is abdominal tenderness (LUQ > RUQ/epigastric tenderness. ). There is guarding.  Musculoskeletal:        General: Normal range of motion.     Cervical back: Normal range of motion and neck supple.  Skin:    General: Skin is warm and dry.     Findings: No rash.  Neurological:     Mental Status: He is alert and oriented to person,  place, and time.     ED Results / Procedures / Treatments   Labs (all labs ordered are listed, but only abnormal results are displayed) Labs Reviewed  BASIC METABOLIC PANEL - Abnormal; Notable for the following components:      Result Value   CO2 21 (*)    Glucose, Bld 104 (*)    Creatinine, Ser 1.39 (*)    GFR calc non Af Amer 59 (*)    All other components within normal limits  CBC - Abnormal; Notable for the following components:   RBC 3.95 (*)    MCV 104.8 (*)    All other components within normal limits  HEPATIC  FUNCTION PANEL  LIPASE, BLOOD  TYPE AND SCREEN  ABO/RH  TROPONIN I (HIGH SENSITIVITY)  TROPONIN I (HIGH SENSITIVITY)    EKG EKG Interpretation  Date/Time:  Sunday Sep 13 2019 21:40:32 EDT Ventricular Rate:  96 PR Interval:  146 QRS Duration: 76 QT Interval:  338 QTC Calculation: 427 R Axis:   48 Text Interpretation: Normal sinus rhythm Normal ECG improved repol changes compared to Aug 20 2019 Reconfirmed by Addison Lank [phone]) on 09/14/2019 1:29:24 AM   Radiology DG Chest 2 View  Result Date: 09/13/2019 CLINICAL DATA:  Chest pain for 1 week. Vomiting. Shortness of breath. EXAM: CHEST - 2 VIEW COMPARISON:  01/20/2018 FINDINGS: The cardiomediastinal contours are normal. Chronic left upper lobe scarring. Pulmonary vasculature is normal. No consolidation, pleural effusion, or pneumothorax. No acute osseous abnormalities are seen. IMPRESSION: No acute chest findings.  Chronic left upper lobe scarring. Electronically Signed   By: Keith Rake M.D.   On: 09/13/2019 22:48    Procedures Procedures (including critical care time)  Medications Ordered in ED Medications  sodium chloride flush (NS) 0.9 % injection 3 mL (has no administration in time range)  morphine 4 MG/ML injection 4 mg (4 mg Intravenous Given 09/14/19 0155)  ondansetron (ZOFRAN) injection 4 mg (4 mg Intravenous Given 09/14/19 0155)  sodium chloride 0.9 % bolus 1,000 mL (1,000 mLs Intravenous  Bolus from Bag 09/14/19 0155)    ED Course  I have reviewed the triage vital signs and the nursing notes.  Pertinent labs & imaging results that were available during my care of the patient were reviewed by me and considered in my medical decision making (see chart for details).    MDM Rules/Calculators/A&P                      Patient to ED with ss/sxs c/w chronic recurrent pancreatitis. No fever.   He does not appear in distress. VSS, mild hypertension. No fever. Labs pending. IV fluids, pain/nausea medications ordered.   3:00 am - No relief with pain medications or with Zofran. No vomiting, but nausea is persistent. Pain worse. Repeat Morphine 4 mg and Phenergan ordered.   4:15 - no further vomiting while in the ED. On recheck he is sleeping, able to rest, appears comfortable. Discussed discharge home. He reports he has pain and nausea medication at home and does not need any prescription. He is encouraged to follow up with his doctor following the holiday weekend (in 2 days)  Final Clinical Impression(s) / ED Diagnoses Final diagnoses:  None   1. Chronic pancreatitis  Rx / DC Orders ED Discharge Orders    None       Dennie Bible 09/14/19 0427    Fatima Blank, MD 09/14/19 [phone]

## 2019-09-14 NOTE — Discharge Instructions (Addendum)
Continue your regular medications for pain and nausea. Follow up with your doctor after the holiday weekend for recheck and further treatment options.

## 2019-09-15 ENCOUNTER — Encounter: Payer: Self-pay | Admitting: Gastroenterology

## 2019-09-21 ENCOUNTER — Telehealth: Payer: Self-pay

## 2019-09-21 NOTE — Telephone Encounter (Signed)
Faxed prior authorization for Creon

## 2019-09-25 NOTE — Telephone Encounter (Signed)
Creon was sent to [facility] but prior authorization was denied.  Please advise

## 2019-09-25 NOTE — Telephone Encounter (Signed)
Please try and see if they will cover for IBS-D diagnosis instead of chronic pancreatitis with pancreatic insufficiency.  Thank you

## 2019-09-28 NOTE — Telephone Encounter (Signed)
Kyle Berry, Did you try sending this through Cover My Meds or just [facility]? I can try to send it through again through [place] [employer]

## 2019-09-29 NOTE — Telephone Encounter (Signed)
Robin resending through Cover My Meds

## 2019-10-13 ENCOUNTER — Emergency Department (HOSPITAL_COMMUNITY): Payer: Managed Care, Other (non HMO)

## 2019-10-13 ENCOUNTER — Encounter (HOSPITAL_COMMUNITY): Payer: Self-pay

## 2019-10-13 ENCOUNTER — Emergency Department (HOSPITAL_COMMUNITY)
Admission: EM | Admit: 2019-10-13 | Discharge: 2019-10-13 | Disposition: A | Discharge status: Home / Self Care | Payer: Managed Care, Other (non HMO) | Source: Home / Self Care | Attending: Emergency Medicine | Admitting: Emergency Medicine

## 2019-10-13 ENCOUNTER — Inpatient Hospital Stay (HOSPITAL_COMMUNITY)
Admission: EM | Admit: 2019-10-13 | Discharge: 2019-10-16 | DRG: 439 | Disposition: A | Discharge status: Home / Self Care | Payer: Managed Care, Other (non HMO) | Attending: Family Medicine | Admitting: Family Medicine

## 2019-10-13 DIAGNOSIS — K625 Hemorrhage of anus and rectum: Secondary | ICD-10-CM | POA: Diagnosis present

## 2019-10-13 DIAGNOSIS — Z87442 Personal history of urinary calculi: Secondary | ICD-10-CM

## 2019-10-13 DIAGNOSIS — K8500 Idiopathic acute pancreatitis without necrosis or infection: Principal | ICD-10-CM | POA: Diagnosis present

## 2019-10-13 DIAGNOSIS — K859 Acute pancreatitis without necrosis or infection, unspecified: Secondary | ICD-10-CM | POA: Diagnosis present

## 2019-10-13 DIAGNOSIS — Z9049 Acquired absence of other specified parts of digestive tract: Secondary | ICD-10-CM

## 2019-10-13 DIAGNOSIS — K219 Gastro-esophageal reflux disease without esophagitis: Secondary | ICD-10-CM | POA: Diagnosis present

## 2019-10-13 DIAGNOSIS — K860 Alcohol-induced chronic pancreatitis: Secondary | ICD-10-CM | POA: Diagnosis present

## 2019-10-13 DIAGNOSIS — I10 Essential (primary) hypertension: Secondary | ICD-10-CM | POA: Diagnosis present

## 2019-10-13 DIAGNOSIS — R710 Precipitous drop in hematocrit: Secondary | ICD-10-CM | POA: Diagnosis present

## 2019-10-13 DIAGNOSIS — J42 Unspecified chronic bronchitis: Secondary | ICD-10-CM | POA: Diagnosis present

## 2019-10-13 DIAGNOSIS — Z8249 Family history of ischemic heart disease and other diseases of the circulatory system: Secondary | ICD-10-CM

## 2019-10-13 DIAGNOSIS — M545 Low back pain: Secondary | ICD-10-CM | POA: Diagnosis present

## 2019-10-13 DIAGNOSIS — Z888 Allergy status to other drugs, medicaments and biological substances status: Secondary | ICD-10-CM

## 2019-10-13 DIAGNOSIS — Q600 Renal agenesis, unilateral: Secondary | ICD-10-CM

## 2019-10-13 DIAGNOSIS — Z79899 Other long term (current) drug therapy: Secondary | ICD-10-CM

## 2019-10-13 DIAGNOSIS — G8929 Other chronic pain: Secondary | ICD-10-CM | POA: Diagnosis present

## 2019-10-13 DIAGNOSIS — Z841 Family history of disorders of kidney and ureter: Secondary | ICD-10-CM

## 2019-10-13 DIAGNOSIS — K920 Hematemesis: Secondary | ICD-10-CM | POA: Diagnosis present

## 2019-10-13 DIAGNOSIS — Z20822 Contact with and (suspected) exposure to covid-19: Secondary | ICD-10-CM | POA: Diagnosis present

## 2019-10-13 DIAGNOSIS — G43909 Migraine, unspecified, not intractable, without status migrainosus: Secondary | ICD-10-CM | POA: Diagnosis present

## 2019-10-13 DIAGNOSIS — E7800 Pure hypercholesterolemia, unspecified: Secondary | ICD-10-CM | POA: Diagnosis present

## 2019-10-13 DIAGNOSIS — K921 Melena: Secondary | ICD-10-CM | POA: Diagnosis present

## 2019-10-13 LAB — COMPREHENSIVE METABOLIC PANEL
ALT: 23 U/L (ref 0–44)
ALT: 21 U/L (ref 0–44)
AST: 32 U/L (ref 15–41)
AST: 26 U/L (ref 15–41)
Albumin: 3.7 g/dL (ref 3.5–5.0)
Albumin: 3.6 g/dL (ref 3.5–5.0)
Alkaline Phosphatase: 55 U/L (ref 38–126)
Alkaline Phosphatase: 53 U/L (ref 38–126)
Anion gap: 14 (ref 5–15)
Anion gap: 10 (ref 5–15)
BUN: 10 mg/dL (ref 6–20)
BUN: 6 mg/dL (ref 6–20)
CO2: 19 mmol/L — ABNORMAL LOW (ref 22–32)
CO2: 22 mmol/L (ref 22–32)
Calcium: 9.2 mg/dL (ref 8.9–10.3)
Calcium: 9.2 mg/dL (ref 8.9–10.3)
Chloride: 103 mmol/L (ref 98–111)
Chloride: 103 mmol/L (ref 98–111)
Creatinine, Ser: 1.17 mg/dL (ref 0.61–1.24)
Creatinine, Ser: 0.98 mg/dL (ref 0.61–1.24)
GFR calc Af Amer: >60 mL/min (ref >60)
GFR calc Af Amer: >60 mL/min (ref >60)
GFR calc non Af Amer: >60 mL/min (ref >60)
GFR calc non Af Amer: >60 mL/min (ref >60)
Glucose, Bld: 122 mg/dL — ABNORMAL HIGH (ref 70–99)
Glucose, Bld: 124 mg/dL — ABNORMAL HIGH (ref 70–99)
Potassium: 3.1 mmol/L — ABNORMAL LOW (ref 3.5–5.1)
Potassium: 3.8 mmol/L (ref 3.5–5.1)
Sodium: 136 mmol/L (ref 135–145)
Sodium: 135 mmol/L (ref 135–145)
Total Bilirubin: 1.7 mg/dL — ABNORMAL HIGH (ref 0.3–1.2)
Total Bilirubin: 1.5 mg/dL — ABNORMAL HIGH (ref 0.3–1.2)
Total Protein: 7.1 g/dL (ref 6.5–8.1)
Total Protein: 6.7 g/dL (ref 6.5–8.1)

## 2019-10-13 LAB — URINALYSIS, ROUTINE W REFLEX MICROSCOPIC
Bilirubin Urine: NEGATIVE
Glucose, UA: NEGATIVE mg/dL
Hgb urine dipstick: NEGATIVE
Ketones, ur: NEGATIVE mg/dL
Leukocytes,Ua: NEGATIVE
Nitrite: NEGATIVE
Protein, ur: NEGATIVE mg/dL
Specific Gravity, Urine: 1.026 (ref 1.005–1.030)
pH: 5 (ref 5.0–8.0)

## 2019-10-13 LAB — CBC
HCT: 39.4 % (ref 39.0–52.0)
HCT: 38.7 % — ABNORMAL LOW (ref 39.0–52.0)
Hemoglobin: 13.5 g/dL (ref 13.0–17.0)
Hemoglobin: 12.9 g/dL — ABNORMAL LOW (ref 13.0–17.0)
MCH: 33 pg (ref 26.0–34.0)
MCH: 32.4 pg (ref 26.0–34.0)
MCHC: 34.3 g/dL (ref 30.0–36.0)
MCHC: 33.3 g/dL (ref 30.0–36.0)
MCV: 96.3 fL (ref 80.0–100.0)
MCV: 97.2 fL (ref 80.0–100.0)
Platelets: 188 10*3/uL (ref 150–400)
Platelets: 177 10*3/uL (ref 150–400)
RBC: 4.09 MIL/uL — ABNORMAL LOW (ref 4.22–5.81)
RBC: 3.98 MIL/uL — ABNORMAL LOW (ref 4.22–5.81)
RDW: 13.2 % (ref 11.5–15.5)
RDW: 13.4 % (ref 11.5–15.5)
WBC: 9.8 10*3/uL (ref 4.0–10.5)
WBC: 7.8 10*3/uL (ref 4.0–10.5)
nRBC: 0 % (ref 0.0–0.2)
nRBC: 0 % (ref 0.0–0.2)

## 2019-10-13 LAB — LIPASE, BLOOD: Lipase: 149 U/L — ABNORMAL HIGH (ref 11–51)

## 2019-10-13 LAB — TYPE AND SCREEN
ABO/RH(D): O POS
Antibody Screen: NEGATIVE

## 2019-10-13 MED ORDER — PROMETHAZINE HCL 25 MG/ML IJ SOLN
25.0000 mg | Freq: Once | INTRAMUSCULAR | Status: AC
  Administered 2019-10-13: 25 mg via INTRAVENOUS
  Filled 2019-10-13: qty 1

## 2019-10-13 MED ORDER — PROMETHAZINE HCL 25 MG PO TABS
25.0000 mg | ORAL_TABLET | Freq: Four times a day (QID) | ORAL | 0 refills | Status: DC | PRN
Start: 2019-10-13 — End: 2019-11-06

## 2019-10-13 MED ORDER — SODIUM CHLORIDE 0.9% FLUSH: 3.0000 mL | Freq: Once | INTRAVENOUS | Status: DC

## 2019-10-13 MED ORDER — HYDROMORPHONE HCL 1 MG/ML IJ SOLN
1.0000 mg | Freq: Once | INTRAMUSCULAR | Status: AC
  Administered 2019-10-13: 1 mg via INTRAVENOUS
  Filled 2019-10-13: qty 1

## 2019-10-13 MED ORDER — POTASSIUM CHLORIDE CRYS ER 20 MEQ PO TBCR
40.0000 meq | EXTENDED_RELEASE_TABLET | Freq: Once | ORAL | Status: AC
  Administered 2019-10-13: 40 meq via ORAL
  Filled 2019-10-13: qty 2

## 2019-10-13 MED ORDER — HYDROCODONE-ACETAMINOPHEN 5-325 MG PO TABS: 1.0000 | ORAL_TABLET | ORAL | 0 refills | Status: DC | PRN

## 2019-10-13 MED ORDER — KETOROLAC TROMETHAMINE 15 MG/ML IJ SOLN
15.0000 mg | Freq: Once | INTRAMUSCULAR | Status: AC
  Administered 2019-10-13: 15 mg via INTRAVENOUS
  Filled 2019-10-13: qty 1

## 2019-10-13 MED ORDER — LACTATED RINGERS IV BOLUS
1000.0000 mL | Freq: Once | INTRAVENOUS | Status: AC
  Administered 2019-10-13: 1000 mL via INTRAVENOUS

## 2019-10-13 MED ORDER — ONDANSETRON HCL 4 MG/2ML IJ SOLN
4.0000 mg | Freq: Once | INTRAMUSCULAR | Status: AC
  Administered 2019-10-13: 4 mg via INTRAVENOUS
  Filled 2019-10-13: qty 2

## 2019-10-13 NOTE — Discharge Instructions (Signed)
If you develop worsening, continued, or recurrent abdominal pain, uncontrolled vomiting, fever, chest or back pain, or any other new/concerning symptoms then return to the ER for evaluation.  

## 2019-10-13 NOTE — ED Triage Notes (Signed)
Pt reports that when he was discharge this morning he went home and began to have blood in his stool, vomit and urine.

## 2019-10-13 NOTE — ED Triage Notes (Signed)
Pt states that he has been having upper abd pain that woke him up this morning, some n/v, hx of pancreatitis

## 2019-10-13 NOTE — ED Provider Notes (Signed)
[facility] Provider Note   CSN: [id number] Arrival date & time: 10/13/19  0601     History Chief Complaint  Patient presents with  . Abdominal Pain    Kyle Berry is a 49 y.o. male.  HPI 49 year old male presents with recurrent abdominal pain consistent with his chronic pancreatitis.  Started around 1:30 AM.  Typically he will start off with vomiting first but this time the pain came first.  It is left upper quadrant and goes into his back.  Some chest pressure as well.  All of this is typical of his pancreatitis.  He had some leftover oxycodone from his last admission but states it did not help.  He has had multiple episodes of vomiting and is also having some diarrhea.   Past Medical History:  Diagnosis Date  . Acute pancreatitis 11/23/2015  . Chronic bronchitis ([place])   . Chronic lower back pain   . Congenital single kidney   . GERD (gastroesophageal reflux disease)   . Headache    "once/month" (05/13/2017)  . High cholesterol   . History of kidney stones   . Hypertension   . Migraine    "a couple/year" (05/13/2017)  . Nephrolithiasis 05/13/2017  . Pneumonia ~ 09/2015  . Recurrent acute pancreatitis   . Renal disorder     Patient Active Problem List   Diagnosis Date Noted  . Pancreatic insufficiency 05/28/2019  . Recurrent acute pancreatitis 10/17/2018  . Microscopic hematuria 10/17/2018  . Acute pancreatitis 07/23/2018  . Pancreatic divisum   . Recurrent pancreatitis 05/13/2017  . Congenital single kidney 05/13/2017  . High cholesterol 05/13/2017  . Nephrolithiasis 05/13/2017  . Acute on chronic pancreatitis ([place]) 01/17/2017  . Acute recurrent pancreatitis   . Hypokalemia   . Nausea and vomiting   . Essential hypertension   . Esophageal reflux   . Chronic pancreatitis ([place]) 11/23/2015    Past Surgical History:  Procedure Laterality Date  . CHOLECYSTECTOMY N/A 10/23/2017   Procedure: LAPAROSCOPIC  CHOLECYSTECTOMY WITH INTRAOPERATIVE CHOLANGIOGRAM;  Surgeon: Johnathan Hausen, MD;  Location: [facility];  Service: General;  Laterality: N/A;  . NO PAST SURGERIES         Family History  Problem Relation Age of Onset  . Hypertension Other   . Hypertension Mother   . Hypertension Father   . Kidney disease Father   . Hypertension Sister   . Diabetes Sister   . Hypertension Brother     Social History   Tobacco Use  . Smoking status: Never Smoker  . Smokeless tobacco: Never Used  Vaping Use  . Vaping Use: Never used  Substance Use Topics  . Alcohol use: Not Currently    Comment: 05/13/2017 "might have beer a few times/year"  . Drug use: No    Home Medications Prior to Admission medications   Medication Sig Start Date End Date Taking? Authorizing Provider  acetaminophen (TYLENOL) 325 MG tablet Take 1-2 tablets by mouth 3 (three) times daily as needed for moderate pain or headache. 08/19/19  Yes [provider]  diphenhydrAMINE (BENADRYL) 25 MG tablet Take 25 mg by mouth daily as needed for itching or allergies.   Yes [provider]  ibuprofen (ADVIL) 800 MG tablet Take 1 tablet (800 mg total) by mouth every 8 (eight) hours as needed. Patient taking differently: Take 800 mg by mouth every 8 (eight) hours as needed for moderate pain.  03/12/19  Yes Fransico Meadow, PA-C  lipase/protease/amylase (CREON) [phone]  UNITS CPEP capsule Take 1 capsule (36,000 Units total) by mouth See admin instructions. Take two capsules (36,000 units) by mouth with meals (3 meals daily) and one capsule (36000 units) with snacks daily 09/07/19  Yes Nandigam, Venia Minks, MD  ondansetron (ZOFRAN ODT) 4 MG disintegrating tablet Take 1 tablet (4 mg total) by mouth every 8 (eight) hours as needed for nausea or vomiting. 03/12/19  Yes Caryl Ada K, PA-C  telmisartan (MICARDIS) 40 MG tablet Take 40 mg by mouth daily. 03/05/19  Yes [provider]  tetrahydrozoline 0.05 % ophthalmic solution  Place 1 drop into both eyes daily as needed (Dry eyes).   Yes [provider]  HYDROcodone-acetaminophen (NORCO) 5-325 MG tablet Take 1 tablet by mouth every 4 (four) hours as needed for severe pain. 10/13/19   Sherwood Gambler, MD  promethazine (PHENERGAN) 25 MG tablet Take 1 tablet (25 mg total) by mouth every 6 (six) hours as needed for nausea or vomiting. 10/13/19   Sherwood Gambler, MD    Allergies    Diclofenac, Norvasc [amlodipine besylate], Omeprazole, and Prednisone  Review of Systems   Review of Systems  Cardiovascular: Positive for chest pain.  Gastrointestinal: Positive for abdominal pain, diarrhea and vomiting.  Musculoskeletal: Positive for back pain.  All other systems reviewed and are negative.   Physical Exam Updated Vital Signs BP (!) 132/98 (BP Location: Right Arm)   Pulse 72   Temp (!) 97.4 F (36.3 C) (Oral)   Resp 20   Ht 6\' 1"  (1.854 m)   Wt 77.6 kg   SpO2 98%   BMI 22.56 kg/m   Physical Exam Vitals and nursing note reviewed.  Constitutional:      General: He is not in acute distress.    Appearance: He is well-developed. He is not ill-appearing or diaphoretic.  HENT:     Head: Normocephalic and atraumatic.     Right Ear: External ear normal.     Left Ear: External ear normal.     Nose: Nose normal.  Eyes:     General:        Right eye: No discharge.        Left eye: No discharge.  Cardiovascular:     Rate and Rhythm: Normal rate and regular rhythm.     Heart sounds: Normal heart sounds.  Pulmonary:     Effort: Pulmonary effort is normal.     Breath sounds: Normal breath sounds.  Abdominal:     Palpations: Abdomen is soft.     Tenderness: There is abdominal tenderness in the right upper quadrant, epigastric area and left upper quadrant.  Musculoskeletal:     Cervical back: Neck supple.  Skin:    General: Skin is warm and dry.  Neurological:     Mental Status: He is alert.  Psychiatric:        Mood and Affect: Mood is not anxious.       ED Results / Procedures / Treatments   Labs (all labs ordered are listed, but only abnormal results are displayed) Labs Reviewed  LIPASE, BLOOD - Abnormal; Notable for the following components:      Result Value   Lipase 149 (*)    All other components within normal limits  COMPREHENSIVE METABOLIC PANEL - Abnormal; Notable for the following components:   Potassium 3.1 (*)    CO2 19 (*)    Glucose, Bld 122 (*)    Total Bilirubin 1.7 (*)    All other components within normal  limits  CBC - Abnormal; Notable for the following components:   RBC 4.09 (*)    All other components within normal limits  URINALYSIS, ROUTINE W REFLEX MICROSCOPIC    EKG EKG Interpretation  Date/Time:  Tuesday October 13 2019 11:15:54 EDT Ventricular Rate:  73 PR Interval:    QRS Duration: 83 QT Interval:  382 QTC Calculation: 421 R Axis:   27 Text Interpretation: Sinus rhythm RSR' in V1 or V2, right VCD or RVH diffuse ST elevation similar to May 2021 Confirmed by Sherwood Gambler [phone]) on 10/13/2019 11:36:36 AM   Radiology DG Chest Portable 1 View  Result Date: 10/13/2019 CLINICAL DATA:  Chest pain and epigastric abdominal pain. Shortness of breath. EXAM: PORTABLE CHEST 1 VIEW COMPARISON:  09/13/2019 FINDINGS: The heart size and mediastinal contours are within normal limits. Both lungs are clear. The visualized skeletal structures are unremarkable. IMPRESSION: Normal exam. Electronically Signed   By: Lorriane Shire M.D.   On: 10/13/2019 11:45    Procedures Procedures (including critical care time)  Medications Ordered in ED Medications  sodium chloride flush (NS) 0.9 % injection 3 mL (has no administration in time range)  HYDROmorphone (DILAUDID) injection 1 mg (1 mg Intravenous Given 10/13/19 1127)  ondansetron (ZOFRAN) injection 4 mg (4 mg Intravenous Given 10/13/19 1120)  lactated ringers bolus 1,000 mL (0 mLs Intravenous Stopped 10/13/19 1345)  ketorolac (TORADOL) 15 MG/ML injection 15 mg (15  mg Intravenous Given 10/13/19 1123)  potassium chloride SA (KLOR-CON) CR tablet 40 mEq (40 mEq Oral Given 10/13/19 1226)  HYDROmorphone (DILAUDID) injection 1 mg (1 mg Intravenous Given 10/13/19 1355)  promethazine (PHENERGAN) injection 25 mg (25 mg Intravenous Given 10/13/19 1349)    ED Course  I have reviewed the triage vital signs and the nursing notes.  Pertinent labs & imaging results that were available during my care of the patient were reviewed by me and considered in my medical decision making (see chart for details).    MDM Rules/Calculators/A&P                          Patient appears to have acute on chronic pancreatitis. No vomiting in the ED. Pain is now controlled. Postassium Repleted.  Will give short course of pain control and antiemetic.  Follow-up with PCP.  Blood work reviewed and otherwise benign.  Given this, I do not think CT would be beneficial.  Return precautions. 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

## 2019-10-14 ENCOUNTER — Other Ambulatory Visit: Payer: Self-pay

## 2019-10-14 ENCOUNTER — Emergency Department (HOSPITAL_COMMUNITY): Payer: Managed Care, Other (non HMO)

## 2019-10-14 DIAGNOSIS — Z888 Allergy status to other drugs, medicaments and biological substances status: Secondary | ICD-10-CM | POA: Diagnosis not present

## 2019-10-14 DIAGNOSIS — G43909 Migraine, unspecified, not intractable, without status migrainosus: Secondary | ICD-10-CM | POA: Diagnosis present

## 2019-10-14 DIAGNOSIS — Z8249 Family history of ischemic heart disease and other diseases of the circulatory system: Secondary | ICD-10-CM | POA: Diagnosis not present

## 2019-10-14 DIAGNOSIS — J42 Unspecified chronic bronchitis: Secondary | ICD-10-CM | POA: Diagnosis present

## 2019-10-14 DIAGNOSIS — Z79899 Other long term (current) drug therapy: Secondary | ICD-10-CM | POA: Diagnosis not present

## 2019-10-14 DIAGNOSIS — K920 Hematemesis: Secondary | ICD-10-CM | POA: Diagnosis present

## 2019-10-14 DIAGNOSIS — K921 Melena: Secondary | ICD-10-CM | POA: Diagnosis present

## 2019-10-14 DIAGNOSIS — K219 Gastro-esophageal reflux disease without esophagitis: Secondary | ICD-10-CM | POA: Diagnosis present

## 2019-10-14 DIAGNOSIS — Z20822 Contact with and (suspected) exposure to covid-19: Secondary | ICD-10-CM | POA: Diagnosis present

## 2019-10-14 DIAGNOSIS — R710 Precipitous drop in hematocrit: Secondary | ICD-10-CM | POA: Diagnosis present

## 2019-10-14 DIAGNOSIS — I10 Essential (primary) hypertension: Secondary | ICD-10-CM | POA: Diagnosis present

## 2019-10-14 DIAGNOSIS — K8500 Idiopathic acute pancreatitis without necrosis or infection: Principal | ICD-10-CM

## 2019-10-14 DIAGNOSIS — K625 Hemorrhage of anus and rectum: Secondary | ICD-10-CM | POA: Diagnosis present

## 2019-10-14 DIAGNOSIS — E7800 Pure hypercholesterolemia, unspecified: Secondary | ICD-10-CM | POA: Diagnosis present

## 2019-10-14 DIAGNOSIS — M545 Low back pain: Secondary | ICD-10-CM | POA: Diagnosis present

## 2019-10-14 DIAGNOSIS — Z9049 Acquired absence of other specified parts of digestive tract: Secondary | ICD-10-CM | POA: Diagnosis not present

## 2019-10-14 DIAGNOSIS — Z841 Family history of disorders of kidney and ureter: Secondary | ICD-10-CM | POA: Diagnosis not present

## 2019-10-14 DIAGNOSIS — K859 Acute pancreatitis without necrosis or infection, unspecified: Secondary | ICD-10-CM | POA: Diagnosis present

## 2019-10-14 DIAGNOSIS — Q600 Renal agenesis, unilateral: Secondary | ICD-10-CM | POA: Diagnosis not present

## 2019-10-14 DIAGNOSIS — K860 Alcohol-induced chronic pancreatitis: Secondary | ICD-10-CM | POA: Diagnosis present

## 2019-10-14 DIAGNOSIS — G8929 Other chronic pain: Secondary | ICD-10-CM | POA: Diagnosis present

## 2019-10-14 DIAGNOSIS — Z87442 Personal history of urinary calculi: Secondary | ICD-10-CM | POA: Diagnosis not present

## 2019-10-14 LAB — OCCULT BLOOD X 1 CARD TO LAB, STOOL: Fecal Occult Bld: NEGATIVE

## 2019-10-14 LAB — SARS CORONAVIRUS 2 BY RT PCR (HOSPITAL ORDER, PERFORMED IN ~~LOC~~ HOSPITAL LAB): SARS Coronavirus 2: NEGATIVE

## 2019-10-14 LAB — LIPASE, BLOOD: Lipase: 197 U/L — ABNORMAL HIGH (ref 11–51)

## 2019-10-14 MED ORDER — ACETAMINOPHEN 325 MG PO TABS: 650.0000 mg | ORAL_TABLET | Freq: Four times a day (QID) | ORAL | Status: DC | PRN

## 2019-10-14 MED ORDER — ONDANSETRON HCL 4 MG PO TABS: 4.0000 mg | ORAL_TABLET | Freq: Four times a day (QID) | ORAL | Status: DC | PRN

## 2019-10-14 MED ORDER — POTASSIUM CHLORIDE IN NACL 20-0.9 MEQ/L-% IV SOLN
INTRAVENOUS | Status: DC
  Filled 2019-10-14 (×5): qty 1000

## 2019-10-14 MED ORDER — PANCRELIPASE (LIP-PROT-AMYL) 36000-114000 UNITS PO CPEP
36000.0000 [IU] | ORAL_CAPSULE | Freq: Three times a day (TID) | ORAL | Status: DC
  Administered 2019-10-15 – 2019-10-16 (×4): 36000 [IU] via ORAL
  Filled 2019-10-14 (×6): qty 1

## 2019-10-14 MED ORDER — ACETAMINOPHEN 650 MG RE SUPP: 650.0000 mg | Freq: Four times a day (QID) | RECTAL | Status: DC | PRN

## 2019-10-14 MED ORDER — HYDROMORPHONE HCL 1 MG/ML IJ SOLN
0.5000 mg | INTRAMUSCULAR | Status: DC | PRN
  Administered 2019-10-14 – 2019-10-16 (×8): 1 mg via INTRAVENOUS
  Filled 2019-10-14 (×8): qty 1

## 2019-10-14 MED ORDER — ENOXAPARIN SODIUM 40 MG/0.4ML ~~LOC~~ SOLN: 40.0000 mg | SUBCUTANEOUS | Status: DC

## 2019-10-14 MED ORDER — HYDROMORPHONE HCL 1 MG/ML IJ SOLN
1.0000 mg | Freq: Once | INTRAMUSCULAR | Status: AC
  Administered 2019-10-14: 1 mg via INTRAVENOUS
  Filled 2019-10-14: qty 1

## 2019-10-14 MED ORDER — ONDANSETRON HCL 4 MG/2ML IJ SOLN
4.0000 mg | Freq: Once | INTRAMUSCULAR | Status: AC
  Administered 2019-10-14: 4 mg via INTRAVENOUS
  Filled 2019-10-14: qty 2

## 2019-10-14 MED ORDER — IOHEXOL 300 MG/ML  SOLN
100.0000 mL | Freq: Once | INTRAMUSCULAR | Status: AC | PRN
  Administered 2019-10-14: 100 mL via INTRAVENOUS

## 2019-10-14 MED ORDER — IRBESARTAN 150 MG PO TABS
150.0000 mg | ORAL_TABLET | Freq: Every day | ORAL | Status: DC
  Administered 2019-10-15 – 2019-10-16 (×2): 150 mg via ORAL
  Filled 2019-10-14 (×2): qty 1

## 2019-10-14 MED ORDER — ONDANSETRON HCL 4 MG/2ML IJ SOLN
4.0000 mg | Freq: Four times a day (QID) | INTRAMUSCULAR | Status: DC | PRN
  Administered 2019-10-15 (×2): 4 mg via INTRAVENOUS
  Filled 2019-10-14 (×2): qty 2

## 2019-10-14 MED ORDER — PANTOPRAZOLE SODIUM 40 MG IV SOLR
40.0000 mg | Freq: Two times a day (BID) | INTRAVENOUS | Status: DC
  Administered 2019-10-14 – 2019-10-16 (×4): 40 mg via INTRAVENOUS
  Filled 2019-10-14 (×4): qty 40

## 2019-10-14 MED ORDER — SODIUM CHLORIDE 0.9 % IV BOLUS
1000.0000 mL | Freq: Once | INTRAVENOUS | Status: AC
  Administered 2019-10-14: 1000 mL via INTRAVENOUS

## 2019-10-14 MED ORDER — PROMETHAZINE HCL 25 MG/ML IJ SOLN
25.0000 mg | Freq: Four times a day (QID) | INTRAMUSCULAR | Status: DC | PRN
  Administered 2019-10-14 – 2019-10-16 (×3): 25 mg via INTRAVENOUS
  Filled 2019-10-14 (×3): qty 1

## 2019-10-14 NOTE — ED Notes (Signed)
Walked patient to the bathroom patient did well 

## 2019-10-14 NOTE — ED Provider Notes (Addendum)
[facility] EMERGENCY DEPARTMENT Provider Note   CSN: [id number] Arrival date & time: 10/13/19  2156     History Chief Complaint  Patient presents with  . GI Bleeding    Kyle Berry is a 49 y.o. male.  HPI      Taking nausea and pain medication prescribed yesterday morning but having persisting vomiting and pain Pain generalized abdominal with radiation to back, 10/10 pain Diarrhea more than can count, bloody stools yesterday-bright red blood yesterday, no blood today Specks of blood with emesis after a lot of emesis, bright red specks Threw up here 4 times, yesterday 6-7 times after admission  Feels like pancreatitis, sometimes an be controlled at home but sometimes cannot. Pain and nausea more severe with this episode.   Denies alcohol use  Past Medical History:  Diagnosis Date  . Acute pancreatitis 11/23/2015  . Chronic bronchitis ([place])   . Chronic lower back pain   . Congenital single kidney   . GERD (gastroesophageal reflux disease)   . Headache    "once/month" (05/13/2017)  . High cholesterol   . History of kidney stones   . Hypertension   . Migraine    "a couple/year" (05/13/2017)  . Nephrolithiasis 05/13/2017  . Pneumonia ~ 09/2015  . Recurrent acute pancreatitis   . Renal disorder     Patient Active Problem List   Diagnosis Date Noted  . Pancreatic insufficiency 05/28/2019  . Recurrent acute pancreatitis 10/17/2018  . Microscopic hematuria 10/17/2018  . Acute pancreatitis 07/23/2018  . Pancreatic divisum   . Recurrent pancreatitis 05/13/2017  . Congenital single kidney 05/13/2017  . High cholesterol 05/13/2017  . Nephrolithiasis 05/13/2017  . Acute on chronic pancreatitis ([place]) 01/17/2017  . Acute recurrent pancreatitis   . Hypokalemia   . Nausea and vomiting   . Essential hypertension   . Esophageal reflux   . Chronic pancreatitis ([place]) 11/23/2015    Past Surgical History:  Procedure Laterality Date  .  CHOLECYSTECTOMY N/A 10/23/2017   Procedure: LAPAROSCOPIC CHOLECYSTECTOMY WITH INTRAOPERATIVE CHOLANGIOGRAM;  Surgeon: Johnathan Hausen, MD;  Location: [facility] ORS;  Service: General;  Laterality: N/A;  . NO PAST SURGERIES         Family History  Problem Relation Age of Onset  . Hypertension Other   . Hypertension Mother   . Hypertension Father   . Kidney disease Father   . Hypertension Sister   . Diabetes Sister   . Hypertension Brother     Social History   Tobacco Use  . Smoking status: Never Smoker  . Smokeless tobacco: Never Used  Vaping Use  . Vaping Use: Never used  Substance Use Topics  . Alcohol use: Not Currently    Comment: 05/13/2017 "might have beer a few times/year"  . Drug use: No    Home Medications Prior to Admission medications   Medication Sig Start Date End Date Taking? Authorizing Provider  acetaminophen (TYLENOL) 325 MG tablet Take 1-2 tablets by mouth 3 (three) times daily as needed for moderate pain or headache. 08/19/19   [provider]  diphenhydrAMINE (BENADRYL) 25 MG tablet Take 25 mg by mouth daily as needed for itching or allergies.    [provider]  HYDROcodone-acetaminophen (NORCO) 5-325 MG tablet Take 1 tablet by mouth every 4 (four) hours as needed for severe pain. 10/13/19   Sherwood Gambler, MD  ibuprofen (ADVIL) 800 MG tablet Take 1 tablet (800 mg total) by mouth every 8 (eight) hours as needed. Patient taking differently:  Take 800 mg by mouth every 8 (eight) hours as needed for moderate pain.  03/12/19   Fransico Meadow, PA-C  lipase/protease/amylase (CREON) 36000 UNITS CPEP capsule Take 1 capsule (36,000 Units total) by mouth See admin instructions. Take two capsules (36,000 units) by mouth with meals (3 meals daily) and one capsule (36000 units) with snacks daily 09/07/19   Nandigam, Venia Minks, MD  ondansetron (ZOFRAN ODT) 4 MG disintegrating tablet Take 1 tablet (4 mg total) by mouth every 8 (eight) hours as needed for nausea or  vomiting. 03/12/19   Fransico Meadow, PA-C  promethazine (PHENERGAN) 25 MG tablet Take 1 tablet (25 mg total) by mouth every 6 (six) hours as needed for nausea or vomiting. 10/13/19   Sherwood Gambler, MD  telmisartan (MICARDIS) 40 MG tablet Take 40 mg by mouth daily. 03/05/19   [provider]  tetrahydrozoline 0.05 % ophthalmic solution Place 1 drop into both eyes daily as needed (Dry eyes).    [provider]    Allergies    Diclofenac, Norvasc [amlodipine besylate], Omeprazole, and Prednisone  Review of Systems   Review of Systems  Constitutional: Positive for diaphoresis. Negative for fever (woke up sweating, not sure if fever).  HENT: Negative for sore throat.   Eyes: Negative for visual disturbance.  Respiratory: Negative for shortness of breath.   Cardiovascular: Negative for chest pain.  Gastrointestinal: Positive for abdominal pain, anal bleeding, diarrhea, nausea and vomiting.  Genitourinary: Positive for decreased urine volume. Negative for difficulty urinating and dysuria.  Musculoskeletal: Negative for back pain and neck stiffness.  Skin: Negative for rash.  Neurological: Negative for syncope and headaches.    Physical Exam Updated Vital Signs BP (!) 132/94   Pulse 75   Temp 98.5 F (36.9 C)   Resp 18   SpO2 97%   Physical Exam Vitals and nursing note reviewed.  Constitutional:      General: He is not in acute distress.    Appearance: He is well-developed. He is not diaphoretic.  HENT:     Head: Normocephalic and atraumatic.  Eyes:     Conjunctiva/sclera: Conjunctivae normal.  Cardiovascular:     Rate and Rhythm: Normal rate and regular rhythm.     Heart sounds: Normal heart sounds. No murmur heard.  No friction rub. No gallop.   Pulmonary:     Effort: Pulmonary effort is normal. No respiratory distress.     Breath sounds: Normal breath sounds. No wheezing or rales.  Abdominal:     General: There is no distension.     Palpations: Abdomen  is soft.     Tenderness: There is abdominal tenderness (diffuse). There is no guarding.  Musculoskeletal:     Cervical back: Normal range of motion.  Skin:    General: Skin is warm and dry.  Neurological:     Mental Status: He is alert and oriented to person, place, and time.     ED Results / Procedures / Treatments   Labs (all labs ordered are listed, but only abnormal results are displayed) Labs Reviewed  COMPREHENSIVE METABOLIC PANEL - Abnormal; Notable for the following components:      Result Value   Glucose, Bld 124 (*)    Total Bilirubin 1.5 (*)    All other components within normal limits  CBC - Abnormal; Notable for the following components:   RBC 3.98 (*)    Hemoglobin 12.9 (*)    HCT 38.7 (*)    All other components within  normal limits  LIPASE, BLOOD - Abnormal; Notable for the following components:   Lipase 197 (*)    All other components within normal limits  TYPE AND SCREEN    EKG None  Radiology CT ABDOMEN PELVIS W CONTRAST  Result Date: 10/14/2019 CLINICAL DATA:  Acute pancreatitis. EXAM: CT ABDOMEN AND PELVIS WITH CONTRAST TECHNIQUE: Multidetector CT imaging of the abdomen and pelvis was performed using the standard protocol following bolus administration of intravenous contrast. CONTRAST:  138mL OMNIPAQUE IOHEXOL 300 MG/ML  SOLN COMPARISON:  Aug 20, 2019. FINDINGS: Lower chest: No acute abnormality. Hepatobiliary: No focal liver abnormality is seen. Status post cholecystectomy. No biliary dilatation. Pancreas: Mild inflammatory changes are noted around the pancreatic body and tail suggesting acute pancreatitis. No definite pseudocyst formation is seen at this time. No significant ductal dilatation is noted. Stable calcification is seen involving the uncinate process of the pancreatic head suggesting prior pancreatitis. Spleen: Normal in size without focal abnormality. Adrenals/Urinary Tract: Adrenal glands appear normal. Severe atrophy of right kidney is again  noted with associated cysts which is stable. Stable nonobstructive calculi seen in lower pole collecting system of left kidney. No hydronephrosis or renal obstruction is noted. Urinary bladder is unremarkable. Stomach/Bowel: Stomach is within normal limits. Appendix appears normal. No evidence of bowel wall thickening, distention, or inflammatory changes. Vascular/Lymphatic: No significant vascular findings are present. No enlarged abdominal or pelvic lymph nodes. Reproductive: Prostate is unremarkable. Other: No abdominal wall hernia or abnormality. No abdominopelvic ascites. Musculoskeletal: No acute or significant osseous findings. IMPRESSION: 1. Mild inflammatory changes are noted around the pancreatic body and tail suggesting acute pancreatitis. No definite pseudocyst formation is noted at this time. 2. Stable calcification is seen involving the uncinate process of the pancreatic head suggesting prior pancreatitis. 3. Stable severe atrophy of right kidney is noted with associated cysts which is stable. 4. Stable nonobstructive left renal calculi. No hydronephrosis or renal obstruction is noted. Electronically Signed   By: Marijo Conception M.D.   On: 10/14/2019 13:48   DG Chest Portable 1 View  Result Date: 10/13/2019 CLINICAL DATA:  Chest pain and epigastric abdominal pain. Shortness of breath. EXAM: PORTABLE CHEST 1 VIEW COMPARISON:  09/13/2019 FINDINGS: The heart size and mediastinal contours are within normal limits. Both lungs are clear. The visualized skeletal structures are unremarkable. IMPRESSION: Normal exam. Electronically Signed   By: Lorriane Shire M.D.   On: 10/13/2019 11:45    Procedures Procedures (including critical care time)  Medications Ordered in ED Medications  sodium chloride 0.9 % bolus 1,000 mL (1,000 mLs Intravenous New Bag/Given 10/14/19 1234)  HYDROmorphone (DILAUDID) injection 1 mg (1 mg Intravenous Given 10/14/19 1236)  ondansetron (ZOFRAN) injection 4 mg (4 mg  Intravenous Given 10/14/19 1236)  iohexol (OMNIPAQUE) 300 MG/ML solution 100 mL (100 mLs Intravenous Contrast Given 10/14/19 1336)  HYDROmorphone (DILAUDID) injection 1 mg (1 mg Intravenous Given 10/14/19 1413)    ED Course  I have reviewed the triage vital signs and the nursing notes.  Pertinent labs & imaging results that were available during my care of the patient were reviewed by me and considered in my medical decision making (see chart for details).    MDM Rules/Calculators/A&P                          49 year old male with hypercholesterolemia, hypertension, pancreatic insufficiency, recurrent acute pancreatitis as well as chronic pancreatitis, presents with concern for continuing abdominal pain, nausea and vomiting diagnosis  pancreatitis yesterday that has not responded to outpatient pain and nausea medications.  Also reports bright red blood per rectum, and specks of bright red blood in emesis.  No significant change in hemoglobin, reports his stool today is normal.  Have low suspicion for clinically significant acute GI bleed.  CT abdomen pelvis shows no evidence of complications of pancreatitis.  Lipase is increased today to 200 from 150 yesterday.  Given his worsening symptoms, inability to tolerate p.o. at home despite nausea medications, will admit for continued care of his acute pancreatitis.  Paged [facility] regarding concern for blood in emesis and bright red blood yesterday per rectum per request of Dr. Doristine Bosworth.  He had normal colored stool on rectal exam and also evidence of hemorrhoids. Suspect hemorrhoid and mallory weiss tear as etiology of symptoms.   Final Clinical Impression(s) / ED Diagnoses Final diagnoses:  Acute pancreatitis, unspecified complication status, unspecified pancreatitis type    Rx / DC Orders ED Discharge Orders    None       Gareth Morgan, MD 10/14/19 1528    Gareth Morgan, MD 10/14/19 6701

## 2019-10-14 NOTE — ED Notes (Signed)
Notified CT that pt is ready for scan.  

## 2019-10-14 NOTE — ED Notes (Signed)
Attempted to call report x 1  

## 2019-10-14 NOTE — ED Notes (Signed)
Two unsuccessful IV attempts.

## 2019-10-14 NOTE — H&P (Signed)
History and Physical    Kyle Berry BMW:[id number] DOB: Nov 15, 1970 DOA: 10/13/2019  PCP: [name] (Inactive)  Patient coming from: Home  I have personally briefly reviewed patient's old medical records in [place]  Chief Complaint: Abdominal pain  HPI: Kyle Berry is a 49 y.o. male with medical history significant of chronic pancreatitis and recurrent acute pancreatitis status post empiric cholecystectomy 2019 who is also created alcohol 3 years ago came in to ED with abdominal pain which started about 2 to 3 days ago.  The pain has been intermittent.  He came to the ED 2 days ago.  Was discharged back home since he was stable but since discharge, he has been having constant nausea and vomiting and also started having diarrhea.  Pain is crampy, radiates to the back, aggravated with eating and relieved with resting.  It is associated with vomiting and diarrhea.  For last 4 episodes of vomiting, he has noticed some blood.  For last couple of episodes of diarrhea, he has noted blood in that one as well.  No fever, chills or sweating.  He is already vaccinated against COVID-19.  ED Course: Upon arrival to ED, he was hemodynamically stable with only slightly elevated diastolic blood pressure.  Lipase elevated.  CT abdomen pelvis confirms acute on chronic pancreatitis.  COVID-19 pending.  Review of Systems: As per HPI otherwise negative.    Past Medical History:  Diagnosis Date  . Acute pancreatitis 11/23/2015  . Chronic bronchitis ([place])   . Chronic lower back pain   . Congenital single kidney   . GERD (gastroesophageal reflux disease)   . Headache    "once/month" (05/13/2017)  . High cholesterol   . History of kidney stones   . Hypertension   . Migraine    "a couple/year" (05/13/2017)  . Nephrolithiasis 05/13/2017  . Pneumonia ~ 09/2015  . Recurrent acute pancreatitis   . Renal disorder     Past Surgical History:  Procedure Laterality Date  .  CHOLECYSTECTOMY N/A 10/23/2017   Procedure: LAPAROSCOPIC CHOLECYSTECTOMY WITH INTRAOPERATIVE CHOLANGIOGRAM;  Surgeon: Johnathan Hausen, MD;  Location: [facility]S;  Service: General;  Laterality: N/A;  . NO PAST SURGERIES       reports that he has never smoked. He has never used smokeless tobacco. He reports previous alcohol use. He reports that he does not use drugs.  Allergies  Allergen Reactions  . Diclofenac Hives  . Norvasc [Amlodipine Besylate] Other (See Comments)    Fluid buildup in chest  . Omeprazole Other (See Comments)    MD stopped due to pancreatitis  . Prednisone Other (See Comments)    Mood swings    Family History  Problem Relation Age of Onset  . Hypertension Other   . Hypertension Mother   . Hypertension Father   . Kidney disease Father   . Hypertension Sister   . Diabetes Sister   . Hypertension Brother     Prior to Admission medications   Medication Sig Start Date End Date Taking? Authorizing Provider  acetaminophen (TYLENOL) 325 MG tablet Take 1-2 tablets by mouth 3 (three) times daily as needed for moderate pain or headache. 08/19/19   [provider]  diphenhydrAMINE (BENADRYL) 25 MG tablet Take 25 mg by mouth daily as needed for itching or allergies.    [provider]  HYDROcodone-acetaminophen (NORCO) 5-325 MG tablet Take 1 tablet by mouth every 4 (four) hours as needed for severe pain. 10/13/19   Sherwood Gambler, MD  ibuprofen (ADVIL) 800 MG tablet Take 1 tablet (800 mg total) by mouth every 8 (eight) hours as needed. Patient taking differently: Take 800 mg by mouth every 8 (eight) hours as needed for moderate pain.  03/12/19   Fransico Meadow, PA-C  lipase/protease/amylase (CREON) 36000 UNITS CPEP capsule Take 1 capsule (36,000 Units total) by mouth See admin instructions. Take two capsules (36,000 units) by mouth with meals (3 meals daily) and one capsule (36000 units) with snacks daily 09/07/19   Nandigam, Venia Minks, MD  ondansetron (ZOFRAN  ODT) 4 MG disintegrating tablet Take 1 tablet (4 mg total) by mouth every 8 (eight) hours as needed for nausea or vomiting. 03/12/19   Fransico Meadow, PA-C  promethazine (PHENERGAN) 25 MG tablet Take 1 tablet (25 mg total) by mouth every 6 (six) hours as needed for nausea or vomiting. 10/13/19   Sherwood Gambler, MD  telmisartan (MICARDIS) 40 MG tablet Take 40 mg by mouth daily. 03/05/19   [provider]  tetrahydrozoline 0.05 % ophthalmic solution Place 1 drop into both eyes daily as needed (Dry eyes).    [provider]    Physical Exam: Vitals:   10/14/19 0646 10/14/19 0851 10/14/19 1430 10/14/19 1500  BP: (!) 151/102 (!) 151/104 (!) 137/94 (!) 132/94  Pulse: 88 87 83 75  Resp: 15 18    Temp:      TempSrc:      SpO2: 100% 100% 95% 97%    Constitutional: NAD, calm, comfortable Vitals:   10/14/19 0646 10/14/19 0851 10/14/19 1430 10/14/19 1500  BP: (!) 151/102 (!) 151/104 (!) 137/94 (!) 132/94  Pulse: 88 87 83 75  Resp: 15 18    Temp:      TempSrc:      SpO2: 100% 100% 95% 97%   Eyes: PERRL, lids and conjunctivae normal ENMT: Mucous membranes are moist. Posterior pharynx clear of any exudate or lesions.Normal dentition.  Neck: normal, supple, no masses, no thyromegaly Respiratory: clear to auscultation bilaterally, no wheezing, no crackles. Normal respiratory effort. No accessory muscle use.  Cardiovascular: Regular rate and rhythm, no murmurs / rubs / gallops. No extremity edema. 2+ pedal pulses. No carotid bruits.  Abdomen: Epigastric and right upper quadrant tenderness, no masses palpated. No hepatosplenomegaly. Bowel sounds positive.  Musculoskeletal: no clubbing / cyanosis. No joint deformity upper and lower extremities. Good ROM, no contractures. Normal muscle tone.  Skin: no rashes, lesions, ulcers. No induration Neurologic: CN 2-12 grossly intact. Sensation intact, DTR normal. Strength 5/5 in all 4.  Psychiatric: Normal judgment and insight. Alert and  oriented x 3. Normal mood.    Labs on Admission: I have personally reviewed following labs and imaging studies  CBC: Recent Labs  Lab 10/13/19 0621 10/13/19 2241  WBC 9.8 7.8  HGB 13.5 12.9*  HCT 39.4 38.7*  MCV 96.3 97.2  PLT 188 563   Basic Metabolic Panel: Recent Labs  Lab 10/13/19 0621 10/13/19 2241  NA 136 135  K 3.1* 3.8  CL 103 103  CO2 19* 22  GLUCOSE 122* 124*  BUN 10 6  CREATININE 1.17 0.98  CALCIUM 9.2 9.2   GFR: Estimated Creatinine Clearance: 99 mL/min (by C-G formula based on SCr of 0.98 mg/dL). Liver Function Tests: Recent Labs  Lab 10/13/19 0621 10/13/19 2241  AST 32 26  ALT 23 21  ALKPHOS 55 53  BILITOT 1.7* 1.5*  PROT 7.1 6.7  ALBUMIN 3.7 3.6   Recent Labs  Lab 10/13/19 0621 10/14/19 1030  LIPASE 149* 197*   No results for input(s): AMMONIA in the last 168 hours. Coagulation Profile: No results for input(s): INR, PROTIME in the last 168 hours. Cardiac Enzymes: No results for input(s): CKTOTAL, CKMB, CKMBINDEX, TROPONINI in the last 168 hours. BNP (last 3 results) No results for input(s): PROBNP in the last 8760 hours. HbA1C: No results for input(s): HGBA1C in the last 72 hours. CBG: No results for input(s): GLUCAP in the last 168 hours. Lipid Profile: No results for input(s): CHOL, HDL, LDLCALC, TRIG, CHOLHDL, LDLDIRECT in the last 72 hours. Thyroid Function Tests: No results for input(s): TSH, T4TOTAL, FREET4, T3FREE, THYROIDAB in the last 72 hours. Anemia Panel: No results for input(s): VITAMINB12, FOLATE, FERRITIN, TIBC, IRON, RETICCTPCT in the last 72 hours. Urine analysis:    Component Value Date/Time   COLORURINE YELLOW 10/13/2019 0613   APPEARANCEUR CLEAR 10/13/2019 0613   LABSPEC 1.026 10/13/2019 0613   PHURINE 5.0 10/13/2019 0613   GLUCOSEU NEGATIVE 10/13/2019 0613   HGBUR NEGATIVE 10/13/2019 0613   BILIRUBINUR NEGATIVE 10/13/2019 0613   KETONESUR NEGATIVE 10/13/2019 0613   PROTEINUR NEGATIVE 10/13/2019 0613    UROBILINOGEN 0.2 01/20/2015 1643   NITRITE NEGATIVE 10/13/2019 0613   LEUKOCYTESUR NEGATIVE 10/13/2019 5732    Radiological Exams on Admission: CT ABDOMEN PELVIS W CONTRAST  Result Date: 10/14/2019 CLINICAL DATA:  Acute pancreatitis. EXAM: CT ABDOMEN AND PELVIS WITH CONTRAST TECHNIQUE: Multidetector CT imaging of the abdomen and pelvis was performed using the standard protocol following bolus administration of intravenous contrast. CONTRAST:  155mL OMNIPAQUE IOHEXOL 300 MG/ML  SOLN COMPARISON:  Aug 20, 2019. FINDINGS: Lower chest: No acute abnormality. Hepatobiliary: No focal liver abnormality is seen. Status post cholecystectomy. No biliary dilatation. Pancreas: Mild inflammatory changes are noted around the pancreatic body and tail suggesting acute pancreatitis. No definite pseudocyst formation is seen at this time. No significant ductal dilatation is noted. Stable calcification is seen involving the uncinate process of the pancreatic head suggesting prior pancreatitis. Spleen: Normal in size without focal abnormality. Adrenals/Urinary Tract: Adrenal glands appear normal. 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 Stable nonobstructive calculi seen in lower pole collecting system of left kidney. No hydronephrosis or renal obstruction is noted. Urinary bladder is unremarkable. Stomach/Bowel: Stomach is within normal limits. Appendix appears normal. No evidence of bowel wall thickening, distention, or inflammatory changes. Vascular/Lymphatic: No significant vascular findings are present. No enlarged abdominal or pelvic lymph nodes. Reproductive: Prostate is unremarkable. Other: No abdominal wall hernia or abnormality. No abdominopelvic ascites. Musculoskeletal: No acute or significant osseous findings. IMPRESSION: 1. Mild inflammatory changes are noted around the pancreatic body and tail suggesting acute pancreatitis. No definite pseudocyst formation is noted at this  time. 2. Stable calcification is seen involving the uncinate process of the pancreatic head suggesting prior pancreatitis. 3. Stable severe atrophy of right kidney is noted with associated cysts which is stable. 4. Stable nonobstructive left renal calculi. No hydronephrosis or renal obstruction is noted. Electronically Signed   By: Marijo Conception M.D.   On: 10/14/2019 13:48   DG Chest Portable 1 View  Result Date: 10/13/2019 CLINICAL DATA:  Chest pain and epigastric abdominal pain. Shortness of breath. EXAM: PORTABLE CHEST 1 VIEW COMPARISON:  09/13/2019 FINDINGS: The heart size and mediastinal contours are within normal limits. Both lungs are clear. The visualized skeletal structures are unremarkable. IMPRESSION: Normal exam. Electronically Signed   By: Lorriane Shire M.D.   On: 10/13/2019 11:45    Assessment/Plan  Active Problems:   Essential hypertension   Acute recurrent pancreatitis   Hematemesis   Acute pancreatitis   Rectal bleeding    Acute on chronic pancreatitis/vomiting: We will manage conservatively.  N.p.o. with ice chips.  IV fluids.  Not on any medications which can cause pancreatitis.  As needed antiemetics.  Essential hypertension: Blood pressure slightly elevated.  Resume home dose of telmisartan.  Hematemesis and rectal bleeding: Hemoglobin stable compared to last month.  I requested ED physician to consult GI for possible scopes.  Repeat H&H tomorrow morning.  Continue Protonix IV twice daily.  DVT prophylaxis: enoxaparin (LOVENOX) injection 40 mg Start: 10/14/19 1645 Code Status: Full code Family Communication: None present at bedside.  Plan of care discussed with patient in length and he verbalized understanding and agreed with it. Disposition Plan: MedSurg, discharge home in next 2 days Consults called: GI, to be called by ED physician Admission status: Inpatient   Status is: Inpatient  Remains inpatient appropriate because:IV treatments appropriate due to  intensity of illness or inability to take PO   Dispo: The patient is from: Home              Anticipated d/c is to: Home              Anticipated d/c date is: 2 days              Patient currently is not medically stable to d/c.       Darliss Cheney MD [facility]  10/14/2019, 4:46 PM  To contact the attending provider between 7A-7P or the covering provider during after hours 7P-7A, please log into the web site [email or web address]

## 2019-10-15 LAB — COMPREHENSIVE METABOLIC PANEL
ALT: 17 U/L (ref 0–44)
AST: 20 U/L (ref 15–41)
Albumin: 3 g/dL — ABNORMAL LOW (ref 3.5–5.0)
Alkaline Phosphatase: 40 U/L (ref 38–126)
Anion gap: 7 (ref 5–15)
BUN: 5 mg/dL — ABNORMAL LOW (ref 6–20)
CO2: 24 mmol/L (ref 22–32)
Calcium: 8.4 mg/dL — ABNORMAL LOW (ref 8.9–10.3)
Chloride: 107 mmol/L (ref 98–111)
Creatinine, Ser: 1.17 mg/dL (ref 0.61–1.24)
GFR calc Af Amer: >60 mL/min (ref >60)
GFR calc non Af Amer: >60 mL/min (ref >60)
Glucose, Bld: 77 mg/dL (ref 70–99)
Potassium: 3.9 mmol/L (ref 3.5–5.1)
Sodium: 138 mmol/L (ref 135–145)
Total Bilirubin: 1.6 mg/dL — ABNORMAL HIGH (ref 0.3–1.2)
Total Protein: 6 g/dL — ABNORMAL LOW (ref 6.5–8.1)

## 2019-10-15 LAB — CBC
HCT: 34 % — ABNORMAL LOW (ref 39.0–52.0)
Hemoglobin: 11.2 g/dL — ABNORMAL LOW (ref 13.0–17.0)
MCH: 32.9 pg (ref 26.0–34.0)
MCHC: 32.9 g/dL (ref 30.0–36.0)
MCV: 100 fL (ref 80.0–100.0)
Platelets: 163 10*3/uL (ref 150–400)
RBC: 3.4 MIL/uL — ABNORMAL LOW (ref 4.22–5.81)
RDW: 13.7 % (ref 11.5–15.5)
WBC: 6.2 10*3/uL (ref 4.0–10.5)
nRBC: 0 % (ref 0.0–0.2)

## 2019-10-15 NOTE — Progress Notes (Signed)
PROGRESS NOTE    DICKEY CAAMANO  OJJ:[id number] DOB: 1970-10-18 DOA: 10/13/2019 PCP: [name] (Inactive)   Brief Narrative:  HPI: Kyle Berry is a 49 y.o. male with medical history significant of chronic pancreatitis and recurrent acute pancreatitis status post empiric cholecystectomy 2019 who is also created alcohol 3 years ago came in to ED with abdominal pain which started about 2 to 3 days ago.  The pain has been intermittent.  He came to the ED 2 days ago.  Was discharged back home since he was stable but since discharge, he has been having constant nausea and vomiting and also started having diarrhea.  Pain is crampy, radiates to the back, aggravated with eating and relieved with resting.  It is associated with vomiting and diarrhea.  For last 4 episodes of vomiting, he has noticed some blood.  For last couple of episodes of diarrhea, he has noted blood in that one as well.  No fever, chills or sweating.  He is already vaccinated against COVID-19.  ED Course: Upon arrival to ED, he was hemodynamically stable with only slightly elevated diastolic blood pressure.  Lipase elevated.  CT abdomen pelvis confirms acute on chronic pancreatitis.  COVID-19 pending.  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  Some nausea but no vomiting.  Start on clears and advance as tolerated.  Essential hypertension: Controlled.  Continue current management.  Hematemesis and rectal bleeding: Some drop in hemoglobin of about 2 g in the last 2 days.  GI was consulted yesterday but did not see patient today.  I reached out to GI and was able to speak to Azucena Freed, PA of GI.  I found out from patient that he already has a scheduled EGD and colonoscopy on 16 July.  Per Judson Roch, they will likely not do  anything while inpatient and continue with the preset plans of scopes as outpatient.  Will monitor H&H later today and tomorrow morning.  Current drop in hemoglobin seems to be hemodilution.  If further significant drop then will reach out to GI again.  DVT prophylaxis: SCD   Code Status: Full Code  Family Communication: None present at bedside.  Plan of care discussed with patient in length and he verbalized understanding and agreed with it.  Status is: Inpatient  Remains inpatient appropriate because:IV treatments appropriate due to intensity of illness or inability to take PO   Dispo: The patient is from: Home              Anticipated d/c is to: Home              Anticipated d/c date is: 1 day              Patient currently is not medically stable to d/c.        Estimated body mass index is 22.43 kg/m as calculated from the following:   Height as of this encounter: 6\' 1"  (1.854 m).   Weight as of this encounter: 77.1 kg.      Nutritional status:               Consultants:   GI over the phone  Procedures:   None  Antimicrobials:  Anti-infectives (From admission, onward)   None         Subjective: Seen and examined.  Still with abdominal pain but now 8 out of 10 compared to 10 out of 10 yesterday.  Some nausea but no vomiting.  No diarrhea.  Objective: Vitals:   10/14/19 2057 10/15/19 0107 10/15/19 0554 10/15/19 1257  BP: (!) 144/95 121/80 116/79 (!) 137/92  Pulse: 75 81 78 85  Resp: 14   17  Temp: 98.5 F (36.9 C) 99.3 F (37.4 C) 98.5 F (36.9 C) 98.7 F (37.1 C)  TempSrc: Oral Oral Oral Oral  SpO2: 100% 98% 97% 99%  Weight:      Height:        Intake/Output Summary (Last 24 hours) at 10/15/2019 1301 Last data filed at 10/15/2019 1045 Gross per 24 hour  Intake 2264.45 ml  Output --  Net 2264.45 ml   Filed Weights   10/14/19 2056  Weight: 77.1 kg    Examination:  General exam: Appears calm and comfortable  Respiratory system:  Clear to auscultation. Respiratory effort normal. Cardiovascular system: S1 & S2 heard, RRR. No JVD, murmurs, rubs, gallops or clicks. No pedal edema. Gastrointestinal system: Abdomen is nondistended, soft and minimal tenderness in the epigastrium. No organomegaly or masses felt. Normal bowel sounds heard. Central nervous system: Alert and oriented. No focal neurological deficits. Extremities: Symmetric 5 x 5 power. Skin: No rashes, lesions or ulcers Psychiatry: Judgement and insight appear normal. Mood & affect appropriate.    Data Reviewed: I have personally reviewed following labs and imaging studies  CBC: Recent Labs  Lab 10/13/19 0621 10/13/19 2241 10/15/19 0322  WBC 9.8 7.8 6.2  HGB 13.5 12.9* 11.2*  HCT 39.4 38.7* 34.0*  MCV 96.3 97.2 100.0  PLT 188 177 505   Basic Metabolic Panel: Recent Labs  Lab 10/13/19 0621 10/13/19 2241 10/15/19 0322  NA 136 135 138  K 3.1* 3.8 3.9  CL 103 103 107  CO2 19* 22 24  GLUCOSE 122* 124* 77  BUN 10 6 5*  CREATININE 1.17 0.98 1.17  CALCIUM 9.2 9.2 8.4*   GFR: Estimated Creatinine Clearance: 82.4 mL/min (by C-G formula based on SCr of 1.17 mg/dL). Liver Function Tests: Recent Labs  Lab 10/13/19 0621 10/13/19 2241 10/15/19 0322  AST 32 26 20  ALT 23 21 17   ALKPHOS 55 53 40  BILITOT 1.7* 1.5* 1.6*  PROT 7.1 6.7 6.0*  ALBUMIN 3.7 3.6 3.0*   Recent Labs  Lab 10/13/19 0621 10/14/19 1030  LIPASE 149* 197*   No results for input(s): AMMONIA in the last 168 hours. Coagulation Profile: No results for input(s): INR, PROTIME in the last 168 hours. Cardiac Enzymes: No results for input(s): CKTOTAL, CKMB, CKMBINDEX, TROPONINI in the last 168 hours. BNP (last 3 results) No results for input(s): PROBNP in the last 8760 hours. HbA1C: No results for input(s): HGBA1C in the last 72 hours. CBG: No results for input(s): GLUCAP in the last 168 hours. Lipid Profile: No results for input(s): CHOL, HDL, LDLCALC, TRIG, CHOLHDL,  LDLDIRECT in the last 72 hours. Thyroid Function Tests: No results for input(s): TSH, T4TOTAL, FREET4, T3FREE, THYROIDAB in the last 72 hours. Anemia Panel: No results for input(s): VITAMINB12, FOLATE, FERRITIN, TIBC, IRON, RETICCTPCT in the last 72 hours. Sepsis Labs: No results for input(s): PROCALCITON, LATICACIDVEN in the last 168 hours.  Recent Results (from the past 240 hour(s))  SARS Coronavirus 2 by RT PCR (hospital order, performed in [facility] hospital lab) Nasopharyngeal Nasopharyngeal Swab     Status: None   Collection Time: 10/14/19  5:17 PM   Specimen:  Nasopharyngeal Swab  Result Value Ref Range Status   SARS Coronavirus 2 NEGATIVE NEGATIVE Final    Comment: (NOTE) SARS-CoV-2 target nucleic acids are NOT DETECTED.  The SARS-CoV-2 RNA is generally detectable in upper and lower respiratory specimens during the acute phase of infection. The lowest concentration of SARS-CoV-2 viral copies this assay can detect is 250 copies / mL. A negative result does not preclude SARS-CoV-2 infection and should not be used as the sole basis for treatment or other patient management decisions.  A negative result may occur with improper specimen collection / handling, submission of specimen other than nasopharyngeal swab, presence of viral mutation(s) within the areas targeted by this assay, and inadequate number of viral copies (<250 copies / mL). A negative result must be combined with clinical observations, patient history, and epidemiological information.  Fact Sheet for Patients:   StrictlyIdeas.no  Fact Sheet for Healthcare Providers: BankingDealers.co.za  This test is not yet approved or  cleared by the [place] FDA and has been authorized for detection and/or diagnosis of SARS-CoV-2 by FDA under an Emergency Use Authorization (EUA).  This EUA will remain in effect (meaning this test can be used) for the duration of  the COVID-19 declaration under Section 564(b)(1) of the Act, 21 U.S.C. section 360bbb-3(b)(1), unless the authorization is terminated or revoked sooner.  Performed at [facility], [place] [address], [place], [place] [address]       Radiology Studies: CT ABDOMEN PELVIS W CONTRAST  Result Date: 10/14/2019 CLINICAL DATA:  Acute pancreatitis. EXAM: CT ABDOMEN AND PELVIS WITH CONTRAST TECHNIQUE: Multidetector CT imaging of the abdomen and pelvis was performed using the standard protocol following bolus administration of intravenous contrast. CONTRAST:  141mL OMNIPAQUE IOHEXOL 300 MG/ML  SOLN COMPARISON:  Aug 20, 2019. FINDINGS: Lower chest: No acute abnormality. Hepatobiliary: No focal liver abnormality is seen. Status post cholecystectomy. No biliary dilatation. Pancreas: Mild inflammatory changes are noted around the pancreatic body and tail suggesting acute pancreatitis. No definite pseudocyst formation is seen at this time. No significant ductal dilatation is noted. Stable calcification is seen involving the uncinate process of the pancreatic head suggesting prior pancreatitis. Spleen: Normal in size without focal abnormality. Adrenals/Urinary Tract: Adrenal glands appear normal. 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 Stable nonobstructive calculi seen in lower pole collecting system of left kidney. No hydronephrosis or renal obstruction is noted. Urinary bladder is unremarkable. Stomach/Bowel: Stomach is within normal limits. Appendix appears normal. No evidence of bowel wall thickening, distention, or inflammatory changes. Vascular/Lymphatic: No significant vascular findings are present. No enlarged abdominal or pelvic lymph nodes. Reproductive: Prostate is unremarkable. Other: No abdominal wall hernia or abnormality. No abdominopelvic ascites. Musculoskeletal: No acute or significant osseous findings. IMPRESSION: 1. Mild inflammatory changes are noted  around the pancreatic body and tail suggesting acute pancreatitis. No definite pseudocyst formation is noted at this time. 2. Stable calcification is seen involving the uncinate process of the pancreatic head suggesting prior pancreatitis. 3. Stable severe atrophy of right kidney is noted with associated cysts which is stable. 4. Stable nonobstructive left renal calculi. No hydronephrosis or renal obstruction is noted. Electronically Signed   By: Marijo Conception M.D.   On: 10/14/2019 13:48    Scheduled Meds:  irbesartan  150 mg Oral Daily   lipase/protease/amylase  36,000 Units Oral TID with meals   pantoprazole (PROTONIX) IV  40 mg Intravenous Q12H   Continuous Infusions:  0.9 % NaCl with  KCl 20 mEq / L 125 mL/hr at 10/15/19 1236     LOS: 1 day   Time spent: 30 minutes   Darliss Cheney, MD [facility]  10/15/2019, 1:01 PM   To contact the attending provider between 7A-7P or the covering provider during after hours 7P-7A, please log into the web site [email or web address].

## 2019-10-16 LAB — CBC WITH DIFFERENTIAL/PLATELET
Abs Immature Granulocytes: 0 10*3/uL (ref 0.00–0.07)
Basophils Absolute: 0 10*3/uL (ref 0.0–0.1)
Basophils Relative: 1 %
Eosinophils Absolute: 0.1 10*3/uL (ref 0.0–0.5)
Eosinophils Relative: 2 %
HCT: 34.9 % — ABNORMAL LOW (ref 39.0–52.0)
Hemoglobin: 11.5 g/dL — ABNORMAL LOW (ref 13.0–17.0)
Immature Granulocytes: 0 %
Lymphocytes Relative: 42 %
Lymphs Abs: 2 10*3/uL (ref 0.7–4.0)
MCH: 34 pg (ref 26.0–34.0)
MCHC: 33 g/dL (ref 30.0–36.0)
MCV: 103.3 fL — ABNORMAL HIGH (ref 80.0–100.0)
Monocytes Absolute: 0.4 10*3/uL (ref 0.1–1.0)
Monocytes Relative: 8 %
Neutro Abs: 2.3 10*3/uL (ref 1.7–7.7)
Neutrophils Relative %: 47 %
Platelets: 173 10*3/uL (ref 150–400)
RBC: 3.38 MIL/uL — ABNORMAL LOW (ref 4.22–5.81)
RDW: 13.6 % (ref 11.5–15.5)
WBC: 4.8 10*3/uL (ref 4.0–10.5)
nRBC: 0 % (ref 0.0–0.2)

## 2019-10-16 LAB — BASIC METABOLIC PANEL
Anion gap: 10 (ref 5–15)
BUN: 5 mg/dL — ABNORMAL LOW (ref 6–20)
CO2: 22 mmol/L (ref 22–32)
Calcium: 8.5 mg/dL — ABNORMAL LOW (ref 8.9–10.3)
Chloride: 107 mmol/L (ref 98–111)
Creatinine, Ser: 1.06 mg/dL (ref 0.61–1.24)
GFR calc Af Amer: >60 mL/min (ref >60)
GFR calc non Af Amer: >60 mL/min (ref >60)
Glucose, Bld: 94 mg/dL (ref 70–99)
Potassium: 4 mmol/L (ref 3.5–5.1)
Sodium: 139 mmol/L (ref 135–145)

## 2019-10-16 NOTE — Discharge Summary (Signed)
Physician Discharge Summary  AZIM GILLINGHAM ZMO:[id number] DOB: [birth year] DOA: 10/13/2019  PCP: [name] (Inactive)  Admit date: 10/13/2019 Discharge date: 10/16/2019  Admitted From: Home Disposition: Home  Recommendations for Outpatient Follow-up:  1. Follow up with PCP in 1-2 weeks 2. Follow with GI as a scheduled in 2 weeks for EGD and colonoscopy 3. Continue with a soft diet for next 1 to 2 days and advance to regular diet as tolerated 4. Please obtain BMP/CBC in one week 5. Please follow up with your PCP on the following pending results: Unresulted Labs (From admission, onward) Comment         None       Home Health: None Equipment/Devices: None  Discharge Condition: Stable CODE STATUS: Full code Diet recommendation: Soft diet  Subjective: Seen and examined.  Abdominal pain much improved and back to his baseline.  No nausea.  Tolerating soft diet.  Wants to go home.  Brief/Interim Summary: Mr. Floyde Dingley. Noreene Larsson is a 49 year old gentleman with a history of chronic and acute on chronic recurrent pancreatitis.  Hypertension presented with complaint of abdominal pain, nausea vomiting and diarrhea.  He was once again diagnosed with acute on chronic pancreatitis.  Admitted under hospital service.  Hemodynamically stable.  Was managed conservatively.  Kept n.p.o. and then a started on clears and then soft diet which he has tolerated now and his pain is back to baseline and does not have any nausea or vomiting.  He is being discharged in stable condition.  He also had some hematemesis and rectal bleeding.  His hemoglobin dropped 2 g but then remained stable over 11.  He already has a scheduled appointment with GI in 2 weeks for EGD and colonoscopy.  Discharge Diagnoses:  Active Problems:   Essential hypertension   Acute recurrent pancreatitis   Hematemesis   Acute pancreatitis   Rectal bleeding    Discharge Instructions   Allergies as of 10/16/2019       Reactions   Diclofenac Hives   Norvasc [amlodipine Besylate] Other (See Comments)   Fluid buildup in chest   Omeprazole Other (See Comments)   MD stopped due to pancreatitis   Prednisone Other (See Comments)   Mood swings      Medication List    TAKE these medications   acetaminophen 325 MG tablet Commonly known as: TYLENOL Take 1-2 tablets by mouth 3 (three) times daily as needed for moderate pain or headache.   diphenhydrAMINE 25 MG tablet Commonly known as: BENADRYL Take 25 mg by mouth daily as needed for itching or allergies.   HYDROcodone-acetaminophen 5-325 MG tablet Commonly known as: Norco Take 1 tablet by mouth every 4 (four) hours as needed for severe pain.   ibuprofen 800 MG tablet Commonly known as: ADVIL Take 1 tablet (800 mg total) by mouth every 8 (eight) hours as needed. What changed: reasons to take this   lipase/protease/amylase 36000 UNITS Cpep capsule Commonly known as: Creon Take 1 capsule (36,000 Units total) by mouth See admin instructions. Take two capsules (36,000 units) by mouth with meals (3 meals daily) and one capsule (36000 units) with snacks daily   ondansetron 4 MG disintegrating tablet Commonly known as: Zofran ODT Take 1 tablet (4 mg total) by mouth every 8 (eight) hours as needed for nausea or vomiting.   promethazine 25 MG tablet Commonly known as: PHENERGAN Take 1 tablet (25 mg total) by mouth every 6 (six) hours as needed for nausea or vomiting.   telmisartan 40  MG tablet Commonly known as: MICARDIS Take 40 mg by mouth daily.   tetrahydrozoline 0.05 % ophthalmic solution Place 1 drop into both eyes daily as needed (Dry eyes).       Follow-up Information    [name], [name] Follow up in 1 week(s).   Contact information: [facility] [address] [place] [address] [phone]              Allergies  Allergen Reactions  . Diclofenac Hives  . Norvasc [Amlodipine Besylate] Other  (See Comments)    Fluid buildup in chest  . Omeprazole Other (See Comments)    MD stopped due to pancreatitis  . Prednisone Other (See Comments)    Mood swings    Consultations: None   Procedures/Studies: CT ABDOMEN PELVIS W CONTRAST  Result Date: 10/14/2019 CLINICAL DATA:  Acute pancreatitis. EXAM: CT ABDOMEN AND PELVIS WITH CONTRAST TECHNIQUE: Multidetector CT imaging of the abdomen and pelvis was performed using the standard protocol following bolus administration of intravenous contrast. CONTRAST:  154mL OMNIPAQUE IOHEXOL 300 MG/ML  SOLN COMPARISON:  Aug 20, 2019. FINDINGS: Lower chest: No acute abnormality. Hepatobiliary: No focal liver abnormality is seen. Status post cholecystectomy. No biliary dilatation. Pancreas: Mild inflammatory changes are noted around the pancreatic body and tail suggesting acute pancreatitis. No definite pseudocyst formation is seen at this time. No significant ductal dilatation is noted. Stable calcification is seen involving the uncinate process of the pancreatic head suggesting prior pancreatitis. Spleen: Normal in size without focal abnormality. Adrenals/Urinary Tract: Adrenal glands appear normal. 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 Stable nonobstructive calculi seen in lower pole collecting system of left kidney. No hydronephrosis or renal obstruction is noted. Urinary bladder is unremarkable. Stomach/Bowel: Stomach is within normal limits. Appendix appears normal. No evidence of bowel wall thickening, distention, or inflammatory changes. Vascular/Lymphatic: No significant vascular findings are present. No enlarged abdominal or pelvic lymph nodes. Reproductive: Prostate is unremarkable. Other: No abdominal wall hernia or abnormality. No abdominopelvic ascites. Musculoskeletal: No acute or significant osseous findings. IMPRESSION: 1. Mild inflammatory changes are noted around the pancreatic body and tail suggesting  acute pancreatitis. No definite pseudocyst formation is noted at this time. 2. Stable calcification is seen involving the uncinate process of the pancreatic head suggesting prior pancreatitis. 3. Stable severe atrophy of right kidney is noted with associated cysts which is stable. 4. Stable nonobstructive left renal calculi. No hydronephrosis or renal obstruction is noted. Electronically Signed   By: Marijo Conception M.D.   On: 10/14/2019 13:48   DG Chest Portable 1 View  Result Date: 10/13/2019 CLINICAL DATA:  Chest pain and epigastric abdominal pain. Shortness of breath. EXAM: PORTABLE CHEST 1 VIEW COMPARISON:  09/13/2019 FINDINGS: The heart size and mediastinal contours are within normal limits. Both lungs are clear. The visualized skeletal structures are unremarkable. IMPRESSION: Normal exam. Electronically Signed   By: Lorriane Shire M.D.   On: 10/13/2019 11:45      Discharge Exam: Vitals:   10/15/19 2156 10/16/19 0447  BP: (!) 141/93 (!) 146/89  Pulse: 68 60  Resp: 17   Temp: 98.6 F (37 C) 97.7 F (36.5 C)  SpO2: 98% 97%   Vitals:   10/15/19 2003 10/15/19 2156 10/16/19 0447 10/16/19 0500  BP: (!) 146/105 (!) 141/93 (!) 146/89   Pulse: 76 68 60   Resp:  17    Temp:  98.6 F (37 C) 97.7 F (36.5 C)  TempSrc:  Oral Oral   SpO2: 98% 98% 97%   Weight:    78.3 kg  Height:        General: Pt is alert, awake, not in acute distress Cardiovascular: RRR, S1/S2 +, no rubs, no gallops Respiratory: CTA bilaterally, no wheezing, no rhonchi Abdominal: Soft, NT, ND, bowel sounds + Extremities: no edema, no cyanosis    The results of significant diagnostics from this hospitalization (including imaging, microbiology, ancillary and laboratory) are listed below for reference.     Microbiology: Recent Results (from the past 240 hour(s))  SARS Coronavirus 2 by RT PCR (hospital order, performed in [facility] hospital lab) Nasopharyngeal Nasopharyngeal Swab     Status: None    Collection Time: 10/14/19  5:17 PM   Specimen: Nasopharyngeal Swab  Result Value Ref Range Status   SARS Coronavirus 2 NEGATIVE NEGATIVE Final    Comment: (NOTE) SARS-CoV-2 target nucleic acids are NOT DETECTED.  The SARS-CoV-2 RNA is generally detectable in upper and lower respiratory specimens during the acute phase of infection. The lowest concentration of SARS-CoV-2 viral copies this assay can detect is 250 copies / mL. A negative result does not preclude SARS-CoV-2 infection and should not be used as the sole basis for treatment or other patient management decisions.  A negative result may occur with improper specimen collection / handling, submission of specimen other than nasopharyngeal swab, presence of viral mutation(s) within the areas targeted by this assay, and inadequate number of viral copies (<250 copies / mL). A negative result must be combined with clinical observations, patient history, and epidemiological information.  Fact Sheet for Patients:   StrictlyIdeas.no  Fact Sheet for Healthcare Providers: BankingDealers.co.za  This test is not yet approved or  cleared by the [place] FDA and has been authorized for detection and/or diagnosis of SARS-CoV-2 by FDA under an Emergency Use Authorization (EUA).  This EUA will remain in effect (meaning this test can be used) for the duration of the COVID-19 declaration under Section 564(b)(1) of the Act, 21 U.S.C. section 360bbb-3(b)(1), unless the authorization is terminated or revoked sooner.  Performed at [facility], [place] [address], [place], [place] [address]      Labs: BNP (last 3 results) No results for input(s): BNP in the last 8760 hours. Basic Metabolic Panel: Recent Labs  Lab 10/13/19 0621 10/13/19 2241 10/15/19 0322 10/16/19 0327  NA 136 135 138 139  K 3.1* 3.8 3.9 4.0  CL 103 103 107 107  CO2 19* 22 24 22   GLUCOSE 122* 124* 77 94  BUN 10 6  5* 5*  CREATININE 1.17 0.98 1.17 1.06  CALCIUM 9.2 9.2 8.4* 8.5*   Liver Function Tests: Recent Labs  Lab 10/13/19 0621 10/13/19 2241 10/15/19 0322  AST 32 26 20  ALT 23 21 17   ALKPHOS 55 53 40  BILITOT 1.7* 1.5* 1.6*  PROT 7.1 6.7 6.0*  ALBUMIN 3.7 3.6 3.0*   Recent Labs  Lab 10/13/19 0621 10/14/19 1030  LIPASE 149* 197*   No results for input(s): AMMONIA in the last 168 hours. CBC: Recent Labs  Lab 10/13/19 0621 10/13/19 2241 10/15/19 0322 10/16/19 0327  WBC 9.8 7.8 6.2 4.8  NEUTROABS  --   --   --  2.3  HGB 13.5 12.9* 11.2* 11.5*  HCT 39.4 38.7* 34.0* 34.9*  MCV 96.3 97.2 100.0 103.3*  PLT 188 177 163 173   Cardiac Enzymes: No results for input(s): CKTOTAL, CKMB, CKMBINDEX, TROPONINI in the last 168  hours. BNP: Invalid input(s): POCBNP CBG: No results for input(s): GLUCAP in the last 168 hours. D-Dimer No results for input(s): DDIMER in the last 72 hours. Hgb A1c No results for input(s): HGBA1C in the last 72 hours. Lipid Profile No results for input(s): CHOL, HDL, LDLCALC, TRIG, CHOLHDL, LDLDIRECT in the last 72 hours. Thyroid function studies No results for input(s): TSH, T4TOTAL, T3FREE, THYROIDAB in the last 72 hours.  Invalid input(s): FREET3 Anemia work up No results for input(s): VITAMINB12, FOLATE, FERRITIN, TIBC, IRON, RETICCTPCT in the last 72 hours. Urinalysis    Component Value Date/Time   COLORURINE YELLOW 10/13/2019 0613   APPEARANCEUR CLEAR 10/13/2019 0613   LABSPEC 1.026 10/13/2019 0613   PHURINE 5.0 10/13/2019 0613   GLUCOSEU NEGATIVE 10/13/2019 0613   HGBUR NEGATIVE 10/13/2019 0613   BILIRUBINUR NEGATIVE 10/13/2019 0613   KETONESUR NEGATIVE 10/13/2019 0613   PROTEINUR NEGATIVE 10/13/2019 0613   UROBILINOGEN 0.2 01/20/2015 1643   NITRITE NEGATIVE 10/13/2019 0613   LEUKOCYTESUR NEGATIVE 10/13/2019 6168   Sepsis Labs Invalid input(s): PROCALCITONIN,  WBC,  LACTICIDVEN Microbiology Recent Results (from the past 240 hour(s))   SARS Coronavirus 2 by RT PCR (hospital order, performed in [place] hospital lab) Nasopharyngeal Nasopharyngeal Swab     Status: None   Collection Time: 10/14/19  5:17 PM   Specimen: Nasopharyngeal Swab  Result Value Ref Range Status   SARS Coronavirus 2 NEGATIVE NEGATIVE Final    Comment: (NOTE) SARS-CoV-2 target nucleic acids are NOT DETECTED.  The SARS-CoV-2 RNA is generally detectable in upper and lower respiratory specimens during the acute phase of infection. The lowest concentration of SARS-CoV-2 viral copies this assay can detect is 250 copies / mL. A negative result does not preclude SARS-CoV-2 infection and should not be used as the sole basis for treatment or other patient management decisions.  A negative result may occur with improper specimen collection / handling, submission of specimen other than nasopharyngeal swab, presence of viral mutation(s) within the areas targeted by this assay, and inadequate number of viral copies (<250 copies / mL). A negative result must be combined with clinical observations, patient history, and epidemiological information.  Fact Sheet for Patients:   StrictlyIdeas.no  Fact Sheet for Healthcare Providers: BankingDealers.co.za  This test is not yet approved or  cleared by the [place] FDA and has been authorized for detection and/or diagnosis of SARS-CoV-2 by FDA under an Emergency Use Authorization (EUA).  This EUA will remain in effect (meaning this test can be used) for the duration of the COVID-19 declaration under Section 564(b)(1) of the Act, 21 U.S.C. section 360bbb-3(b)(1), unless the authorization is terminated or revoked sooner.  Performed at [facility], [place] [address], [place], [place] [address]      Time coordinating discharge: Over 30 minutes  SIGNED:   Darliss Cheney, MD  [facility] 10/16/2019, 10:04 AM  If 7PM-7AM, please contact  night-coverage [email or web address]

## 2019-10-16 NOTE — Discharge Instructions (Signed)
Acute Pancreatitis    Acute pancreatitis happens when the pancreas gets swollen. The pancreas is a large gland in the body that helps to control blood sugar. It also makes enzymes that help to digest food.  This condition can last a few days and cause serious problems. The lungs, heart, and kidneys may stop working.  What are the causes?  Causes include:  · Alcohol abuse.  · Drug abuse.  · Gallstones.  · A tumor in the pancreas.  Other causes include:  · Some medicines.  · Some chemicals.  · Diabetes.  · An infection.  · Damage caused by an accident.  · The poison (venom) from a scorpion bite.  · Belly (abdominal) surgery.  · The body's defense system (immune system) attacking the pancreas (autoimmune pancreatitis).  · Genes that are passed from parent to child (inherited).  In some cases, the cause is not known.  What are the signs or symptoms?  · Pain in the upper belly that may be felt in the back. The pain may be very bad.  · Swelling of the belly.  · Feeling sick to your stomach (nauseous) and throwing up (vomiting).  · Fever.  How is this treated?  You will likely have to stay in the hospital. Treatment may include:  · Pain medicine.  · Fluid through an IV tube.  · Placing a tube in the stomach to take out the stomach contents. This may help you stop throwing up.  · Not eating for 3-4 days.  · Antibiotic medicines, if you have an infection.  · Treating any other problems that may be the cause.  · Steroid medicines, if your problem is caused by your defense system attacking your body's own tissues.  · Surgery.  Follow these instructions at home:  Eating and drinking    · Follow instructions from your doctor about what to eat and drink.  · Eat foods that do not have a lot of fat in them.  · Eat small meals often. Do not eat big meals.  · Drink enough fluid to keep your pee (urine) pale yellow.  · Do not drink alcohol if it caused your condition.  Medicines  · Take over-the-counter and prescription medicines only  as told by your doctor.  · Ask your doctor if the medicine prescribed to you:  ? Requires you to avoid driving or using heavy machinery.  ? Can cause trouble pooping (constipation). You may need to take steps to prevent or treat trouble pooping:  § Take over-the-counter or prescription medicines.  § Eat foods that are high in fiber. These include beans, whole grains, and fresh fruits and vegetables.  § Limit foods that are high in fat and sugar. These include fried or sweet foods.  General instructions  · Do not use any products that contain nicotine or tobacco, such as cigarettes, e-cigarettes, and chewing tobacco. If you need help quitting, ask your doctor.  · Get plenty of rest.  · Check your blood sugar at home as told by your doctor.  · Keep all follow-up visits as told by your doctor. This is important.  Contact a doctor if:  · You do not get better as quickly as expected.  · You have new symptoms.  · Your symptoms get worse.  · You have pain or weakness that lasts a long time.  · You keep feeling sick to your stomach.  · You get better and then you have pain again.  ·   You have a fever.  Get help right away if:  · You cannot eat or keep fluids down.  · Your pain gets very bad.  · Your skin or the white part of your eyes turns yellow.  · You have sudden swelling in your belly.  · You throw up.  · You feel dizzy or you pass out (faint).  · Your blood sugar is high (over 300 mg/dL).  Summary  · Acute pancreatitis happens when the pancreas gets swollen.  · This condition is often caused by alcohol abuse, drug abuse, or gallstones.  · You will likely have to stay in the hospital for treatment.  This information is not intended to replace advice given to you by your health care provider. Make sure you discuss any questions you have with your health care provider.  Document Revised: 01/20/2018 Document Reviewed: 01/20/2018  Elsevier Patient Education © 2020 [employer]

## 2019-10-16 NOTE — Progress Notes (Signed)
Patient discharged to home. Verbalizes understanding of all discharge instructions including follow up MD visits. Patient walked independently to car.

## 2019-10-17 NOTE — Progress Notes (Signed)
  Subjective:  Patient ID: Kyle Berry, male    DOB: [birth year],  MRN: [id number]  Chief Complaint  Patient presents with  . Neuroma    Pt here to review MRI results.    49 y.o. male presents for follow-up. Hx as above. Objective:  Physical Exam: warm, good capillary refill, no trophic changes or ulcerative lesions, normal DP and PT pulses and normal sensory exam. Left Foot: tenderness between the 2nd and 3rd metatarsal head and hammertoe deformity bilat with soft corns 3rd toe  Right Foot: tenderness between the 2nd and 3rd metatarsal head and hammertoe deformity bilat with soft corns 3rd toe  Assessment:   1. Metatarsalgia of both feet   2. Hammertoes of both feet   3. Tenosynovitis     Plan:  Patient was evaluated and treated and all questions answered.  Hammertoe and Interdigital Neuroma -MRI reviewed with patient.  No MRI evidence of Morton's neuroma.  Only mild inflammation around the flexor tendon of the second toe no tendinopathy or tendon tear -Advised to rest the area follow-up should pain recur   No follow-ups on file.

## 2019-10-23 ENCOUNTER — Encounter: Payer: Self-pay | Admitting: Gastroenterology

## 2019-10-30 ENCOUNTER — Encounter: Payer: Self-pay | Admitting: Gastroenterology

## 2019-10-30 ENCOUNTER — Ambulatory Visit (AMBULATORY_SURGERY_CENTER): Payer: Managed Care, Other (non HMO) | Admitting: Gastroenterology

## 2019-10-30 ENCOUNTER — Other Ambulatory Visit: Payer: Self-pay

## 2019-10-30 VITALS — BP 161/93 | HR 61 | Temp 97.5°F | Resp 20 | Ht 72.0 in | Wt 165.0 lb

## 2019-10-30 DIAGNOSIS — K2970 Gastritis, unspecified, without bleeding: Secondary | ICD-10-CM

## 2019-10-30 DIAGNOSIS — D120 Benign neoplasm of cecum: Secondary | ICD-10-CM | POA: Diagnosis not present

## 2019-10-30 DIAGNOSIS — D127 Benign neoplasm of rectosigmoid junction: Secondary | ICD-10-CM | POA: Diagnosis not present

## 2019-10-30 DIAGNOSIS — K859 Acute pancreatitis without necrosis or infection, unspecified: Secondary | ICD-10-CM

## 2019-10-30 DIAGNOSIS — R112 Nausea with vomiting, unspecified: Secondary | ICD-10-CM

## 2019-10-30 DIAGNOSIS — D123 Benign neoplasm of transverse colon: Secondary | ICD-10-CM | POA: Diagnosis not present

## 2019-10-30 DIAGNOSIS — K319 Disease of stomach and duodenum, unspecified: Secondary | ICD-10-CM | POA: Diagnosis not present

## 2019-10-30 DIAGNOSIS — Z1211 Encounter for screening for malignant neoplasm of colon: Secondary | ICD-10-CM

## 2019-10-30 MED ORDER — SODIUM CHLORIDE 0.9 % IV SOLN
500.0000 mL | Freq: Once | INTRAVENOUS | Status: DC
Start: 2019-10-30 — End: 2019-10-30

## 2019-10-30 NOTE — Patient Instructions (Signed)
Handout on Gastritis given to you today  Await pathology results on biopsies  Return to GI office at next available appointment - make this appointment   Handouts on polyps,diverticulosis,& hemorrhoids given to you today  Await pathology results on polyps removed  Repeat colonoscopy in 1 year   YOU HAD AN ENDOSCOPIC PROCEDURE TODAY AT [place]:   Refer to the procedure report that was given to you for any specific questions about what was found during the examination.  If the procedure report does not answer your questions, please call your gastroenterologist to clarify.  If you requested that your care partner not be given the details of your procedure findings, then the procedure report has been included in a sealed envelope for you to review at your convenience later.  YOU SHOULD EXPECT: Some feelings of bloating in the abdomen. Passage of more gas than usual.  Walking can help get rid of the air that was put into your GI tract during the procedure and reduce the bloating. If you had a lower endoscopy (such as a colonoscopy or flexible sigmoidoscopy) you may notice spotting of blood in your stool or on the toilet paper. If you underwent a bowel prep for your procedure, you may not have a normal bowel movement for a few days.  Please Note:  You might notice some irritation and congestion in your nose or some drainage.  This is from the oxygen used during your procedure.  There is no need for concern and it should clear up in a day or so.  SYMPTOMS TO REPORT IMMEDIATELY:   Following lower endoscopy (colonoscopy or flexible sigmoidoscopy):  Excessive amounts of blood in the stool  Significant tenderness or worsening of abdominal pains  Swelling of the abdomen that is new, acute  Fever of 100F or higher   Following upper endoscopy (EGD)  Vomiting of blood or coffee ground material  New chest pain or pain under the shoulder blades  Painful or persistently  difficult swallowing  New shortness of breath  Fever of 100F or higher  Black, tarry-looking stools  For urgent or emergent issues, a gastroenterologist can be reached at any hour by calling [phone]. Do not use MyChart messaging for urgent concerns.    DIET:  We do recommend a small meal at first, but then you may proceed to your regular diet.  Drink plenty of fluids but you should avoid alcoholic beverages for 24 hours.  ACTIVITY:  You should plan to take it easy for the rest of today and you should NOT DRIVE or use heavy machinery until tomorrow (because of the sedation medicines used during the test).    FOLLOW UP: Our staff will call the number listed on your records 48-72 hours following your procedure to check on you and address any questions or concerns that you may have regarding the information given to you following your procedure. If we do not reach you, we will leave a message.  We will attempt to reach you two times.  During this call, we will ask if you have developed any symptoms of COVID 19. If you develop any symptoms (ie: fever, flu-like symptoms, shortness of breath, cough etc.) before then, please call [phone].  If you test positive for Covid 19 in the 2 weeks post procedure, please call and report this information to [place].    If any biopsies were taken you will be contacted by phone or by letter within the next 1-3 weeks.  Please call us at [phone] if you have not heard about the biopsies in 3 weeks.    SIGNATURES/CONFIDENTIALITY: You and/or your care partner have signed paperwork which will be entered into your electronic medical record.  These signatures attest to the fact that that the information above on your After Visit Summary has been reviewed and is understood.  Full responsibility of the confidentiality of this discharge information lies with you and/or your care-partner.

## 2019-10-30 NOTE — Op Note (Signed)
[facility] Patient Name: Kyle Berry Procedure Date: 10/30/2019 11:14 AM MRN: [id number] Endoscopist: Mauri Pole , MD Age: 49 Referring MD:  Date of Birth: 01/19/1971 Gender: Male Account #: [id number] Procedure:                Upper GI endoscopy Indications:              Persistent vomiting of unknown cause, Epigastric                            abdominal pain Medicines:                Monitored Anesthesia Care Procedure:                Pre-Anesthesia Assessment:                           - Prior to the procedure, a History and Physical                            was performed, and patient medications and                            allergies were reviewed. The patient's tolerance of                            previous anesthesia was also reviewed. The risks                            and benefits of the procedure and the sedation                            options and risks were discussed with the patient.                            All questions were answered, and informed consent                            was obtained. Prior Anticoagulants: The patient has                            taken no previous anticoagulant or antiplatelet                            agents. ASA Grade Assessment: II - A patient with                            mild systemic disease. After reviewing the risks                            and benefits, the patient was deemed in                            satisfactory condition to undergo the procedure.  After obtaining informed consent, the endoscope was                            passed under direct vision. Throughout the                            procedure, the patient's blood pressure, pulse, and                            oxygen saturations were monitored continuously. The                            Endoscope was introduced through the mouth, and                            advanced to the second part of  duodenum. The upper                            GI endoscopy was accomplished without difficulty.                            The patient tolerated the procedure well. Scope In: Scope Out: Findings:                 The Z-line was regular and was found 35 cm from the                            incisors.                           No gross lesions were noted in the entire esophagus.                           Patchy mild inflammation characterized by                            congestion (edema) and erythema was found in the                            entire examined stomach. Biopsies were taken with a                            cold forceps for Helicobacter pylori testing.                           The examined duodenum was normal. Complications:            No immediate complications. Estimated Blood Loss:     Estimated blood loss was minimal. Impression:               - Z-line regular, 35 cm from the incisors.                           - No gross lesions in esophagus.                           -  Gastritis. Biopsied.                           - Normal examined duodenum. Recommendation:           - Patient has a contact number available for                            emergencies. The signs and symptoms of potential                            delayed complications were discussed with the                            patient. Return to normal activities tomorrow.                            Written discharge instructions were provided to the                            patient.                           - Resume previous diet.                           - Continue present medications.                           - Await pathology results.                           - Return to GI office at the next available                            appointment for chronic pancreatitis. Mauri Pole, MD 10/30/2019 12:04:26 PM This report has been signed electronically.

## 2019-10-30 NOTE — Progress Notes (Signed)
Called to room to assist during endoscopic procedure.  Patient ID and intended procedure confirmed with present staff. Received instructions for my participation in the procedure from the performing physician.  

## 2019-10-30 NOTE — Op Note (Signed)
[facility] Patient Name: Kyle Berry Procedure Date: 10/30/2019 11:14 AM MRN: [id number] Endoscopist: Mauri Pole , MD Age: 49 Referring MD:  Date of Birth: 09/10/1970 Gender: Male Account #: [id number] Procedure:                Colonoscopy Indications:              Screening for colorectal malignant neoplasm Medicines:                Monitored Anesthesia Care Procedure:                Pre-Anesthesia Assessment:                           - Prior to the procedure, a History and Physical                            was performed, and patient medications and                            allergies were reviewed. The patient's tolerance of                            previous anesthesia was also reviewed. The risks                            and benefits of the procedure and the sedation                            options and risks were discussed with the patient.                            All questions were answered, and informed consent                            was obtained. Prior Anticoagulants: The patient has                            taken no previous anticoagulant or antiplatelet                            agents. ASA Grade Assessment: II - A patient with                            mild systemic disease. After reviewing the risks                            and benefits, the patient was deemed in                            satisfactory condition to undergo the procedure.                           After obtaining informed consent, the colonoscope  was passed under direct vision. Throughout the                            procedure, the patient's blood pressure, pulse, and                            oxygen saturations were monitored continuously. The                            Colonoscope was introduced through the anus and                            advanced to the the cecum, identified by                            appendiceal orifice  and ileocecal valve. The                            colonoscopy was performed without difficulty. The                            patient tolerated the procedure well. The quality                            of the bowel preparation was fair. The ileocecal                            valve, appendiceal orifice, and rectum were                            photographed. Scope In: 11:35:17 AM Scope Out: 11:57:12 AM Scope Withdrawal Time: 0 hours 18 minutes 47 seconds  Total Procedure Duration: 0 hours 21 minutes 55 seconds  Findings:                 The perianal and digital rectal examinations were                            normal.                           A 20 mm polyp was found in the cecum. The polyp was                            multi-lobulated and pedunculated. The polyp was                            removed with a hot snare. Resection and retrieval                            were complete.                           A 5 mm polyp was found in the transverse colon. The  polyp was sessile. The polyp was removed with a                            cold snare. Resection and retrieval were complete.                           A less than 1 mm polyp was found in the transverse                            colon. The polyp was sessile. The polyp was removed                            with a cold biopsy forceps. Resection and retrieval                            were complete.                           A 30 mm polyp was found in the recto-sigmoid colon.                            The polyp was pedunculated. The polyp was removed                            with a hot snare. Resection and retrieval were                            complete. To prevent bleeding after the                            polypectomy, one hemostatic clip was successfully                            placed (MR conditional). There was no bleeding at                            the end of the procedure.                            Scattered small-mouthed diverticula were found in                            the sigmoid colon, ascending colon and cecum.                           Non-bleeding internal hemorrhoids were found during                            retroflexion. The hemorrhoids were medium-sized. Complications:            No immediate complications. Estimated Blood Loss:     Estimated blood loss was minimal. Impression:               - Preparation of the colon was fair.                           -  One 20 mm polyp in the cecum, removed with a hot                            snare. Resected and retrieved.                           - One 5 mm polyp in the transverse colon, removed                            with a cold snare. Resected and retrieved.                           - One less than 1 mm polyp in the transverse colon,                            removed with a cold biopsy forceps. Resected and                            retrieved.                           - One 30 mm polyp at the recto-sigmoid colon,                            removed with a hot snare. Resected and retrieved.                            Clip (MR conditional) was placed.                           - Diverticulosis in the sigmoid colon, in the                            ascending colon and in the cecum.                           - Non-bleeding internal hemorrhoids. Recommendation:           - Patient has a contact number available for                            emergencies. The signs and symptoms of potential                            delayed complications were discussed with the                            patient. Return to normal activities tomorrow.                            Written discharge instructions were provided to the                            patient.                           -  Resume previous diet.                           - Continue present medications.                           - Await pathology  results.                           - Repeat colonoscopy in 1 year for surveillance                            based on pathology results. Mauri Pole, MD 10/30/2019 12:08:52 PM This report has been signed electronically.

## 2019-10-30 NOTE — Progress Notes (Signed)
PT taken to PACU. Monitors in place. VSS. Report given to RN. 

## 2019-11-01 ENCOUNTER — Observation Stay (HOSPITAL_COMMUNITY)
Admission: EM | Admit: 2019-11-01 | Discharge: 2019-11-02 | Disposition: A | Discharge status: Home / Self Care | Payer: Managed Care, Other (non HMO) | Attending: Internal Medicine | Admitting: Internal Medicine

## 2019-11-01 ENCOUNTER — Other Ambulatory Visit: Payer: Self-pay

## 2019-11-01 DIAGNOSIS — R42 Dizziness and giddiness: Secondary | ICD-10-CM | POA: Diagnosis not present

## 2019-11-01 DIAGNOSIS — Z20822 Contact with and (suspected) exposure to covid-19: Secondary | ICD-10-CM | POA: Diagnosis not present

## 2019-11-01 DIAGNOSIS — I129 Hypertensive chronic kidney disease with stage 1 through stage 4 chronic kidney disease, or unspecified chronic kidney disease: Secondary | ICD-10-CM | POA: Insufficient documentation

## 2019-11-01 DIAGNOSIS — K861 Other chronic pancreatitis: Secondary | ICD-10-CM | POA: Diagnosis present

## 2019-11-01 DIAGNOSIS — N179 Acute kidney failure, unspecified: Secondary | ICD-10-CM | POA: Diagnosis present

## 2019-11-01 DIAGNOSIS — K633 Ulcer of intestine: Secondary | ICD-10-CM | POA: Diagnosis not present

## 2019-11-01 DIAGNOSIS — K91840 Postprocedural hemorrhage and hematoma of a digestive system organ or structure following a digestive system procedure: Principal | ICD-10-CM | POA: Insufficient documentation

## 2019-11-01 DIAGNOSIS — I10 Essential (primary) hypertension: Secondary | ICD-10-CM | POA: Diagnosis present

## 2019-11-01 DIAGNOSIS — K5730 Diverticulosis of large intestine without perforation or abscess without bleeding: Secondary | ICD-10-CM | POA: Diagnosis not present

## 2019-11-01 DIAGNOSIS — N1831 Chronic kidney disease, stage 3a: Secondary | ICD-10-CM | POA: Insufficient documentation

## 2019-11-01 DIAGNOSIS — Z79899 Other long term (current) drug therapy: Secondary | ICD-10-CM | POA: Insufficient documentation

## 2019-11-01 DIAGNOSIS — K922 Gastrointestinal hemorrhage, unspecified: Secondary | ICD-10-CM | POA: Diagnosis present

## 2019-11-01 DIAGNOSIS — R55 Syncope and collapse: Secondary | ICD-10-CM | POA: Diagnosis present

## 2019-11-01 NOTE — ED Triage Notes (Addendum)
Per pt he had a colonoscopy on Friday To have polyps removed and had a clamp placed bc of bleeding  Pt was told he may see a little blood but tonight about 10:30 he went to restroom and had a lot of blood.Pt said he has seen a great deal more . Pt said he feels the urge to go to the restroom and all that is coming out is pure blood.

## 2019-11-02 ENCOUNTER — Observation Stay (HOSPITAL_COMMUNITY): Payer: Managed Care, Other (non HMO) | Admitting: Anesthesiology

## 2019-11-02 ENCOUNTER — Encounter (HOSPITAL_COMMUNITY)
Admission: EM | Disposition: A | Discharge status: Home / Self Care | Payer: Self-pay | Source: Home / Self Care | Attending: Emergency Medicine

## 2019-11-02 ENCOUNTER — Encounter (HOSPITAL_COMMUNITY): Payer: Self-pay | Admitting: Family Medicine

## 2019-11-02 DIAGNOSIS — N179 Acute kidney failure, unspecified: Secondary | ICD-10-CM | POA: Diagnosis not present

## 2019-11-02 DIAGNOSIS — K861 Other chronic pancreatitis: Secondary | ICD-10-CM

## 2019-11-02 DIAGNOSIS — R55 Syncope and collapse: Secondary | ICD-10-CM | POA: Diagnosis not present

## 2019-11-02 DIAGNOSIS — D62 Acute posthemorrhagic anemia: Secondary | ICD-10-CM

## 2019-11-02 DIAGNOSIS — K633 Ulcer of intestine: Secondary | ICD-10-CM | POA: Diagnosis not present

## 2019-11-02 DIAGNOSIS — K922 Gastrointestinal hemorrhage, unspecified: Secondary | ICD-10-CM | POA: Diagnosis not present

## 2019-11-02 DIAGNOSIS — I10 Essential (primary) hypertension: Secondary | ICD-10-CM | POA: Diagnosis not present

## 2019-11-02 HISTORY — PX: HEMOSTASIS CLIP PLACEMENT: SHX6857

## 2019-11-02 HISTORY — PX: COLONOSCOPY WITH PROPOFOL: SHX5780

## 2019-11-02 LAB — BASIC METABOLIC PANEL
Anion gap: 9 (ref 5–15)
BUN: 13 mg/dL (ref 6–20)
CO2: 21 mmol/L — ABNORMAL LOW (ref 22–32)
Calcium: 8.5 mg/dL — ABNORMAL LOW (ref 8.9–10.3)
Chloride: 109 mmol/L (ref 98–111)
Creatinine, Ser: 1.37 mg/dL — ABNORMAL HIGH (ref 0.61–1.24)
GFR calc Af Amer: >60 mL/min (ref >60)
GFR calc non Af Amer: 60 mL/min — ABNORMAL LOW (ref >60)
Glucose, Bld: 124 mg/dL — ABNORMAL HIGH (ref 70–99)
Potassium: 4.2 mmol/L (ref 3.5–5.1)
Sodium: 139 mmol/L (ref 135–145)

## 2019-11-02 LAB — COMPREHENSIVE METABOLIC PANEL
ALT: 21 U/L (ref 0–44)
AST: 22 U/L (ref 15–41)
Albumin: 3.8 g/dL (ref 3.5–5.0)
Alkaline Phosphatase: 58 U/L (ref 38–126)
Anion gap: 10 (ref 5–15)
BUN: 12 mg/dL (ref 6–20)
CO2: 22 mmol/L (ref 22–32)
Calcium: 9.2 mg/dL (ref 8.9–10.3)
Chloride: 105 mmol/L (ref 98–111)
Creatinine, Ser: 1.44 mg/dL — ABNORMAL HIGH (ref 0.61–1.24)
GFR calc Af Amer: >60 mL/min (ref >60)
GFR calc non Af Amer: 56 mL/min — ABNORMAL LOW (ref >60)
Glucose, Bld: 105 mg/dL — ABNORMAL HIGH (ref 70–99)
Potassium: 3.9 mmol/L (ref 3.5–5.1)
Sodium: 137 mmol/L (ref 135–145)
Total Bilirubin: 0.5 mg/dL (ref 0.3–1.2)
Total Protein: 6.9 g/dL (ref 6.5–8.1)

## 2019-11-02 LAB — CBC
HCT: 38.6 % — ABNORMAL LOW (ref 39.0–52.0)
Hemoglobin: 12.8 g/dL — ABNORMAL LOW (ref 13.0–17.0)
MCH: 32.3 pg (ref 26.0–34.0)
MCHC: 33.2 g/dL (ref 30.0–36.0)
MCV: 97.5 fL (ref 80.0–100.0)
Platelets: 243 10*3/uL (ref 150–400)
RBC: 3.96 MIL/uL — ABNORMAL LOW (ref 4.22–5.81)
RDW: 13.2 % (ref 11.5–15.5)
WBC: 6.4 10*3/uL (ref 4.0–10.5)
nRBC: 0 % (ref 0.0–0.2)

## 2019-11-02 LAB — PROTIME-INR
INR: 1.1 (ref 0.8–1.2)
Prothrombin Time: 13.8 seconds (ref 11.4–15.2)

## 2019-11-02 LAB — TYPE AND SCREEN
ABO/RH(D): O POS
Antibody Screen: NEGATIVE

## 2019-11-02 LAB — HEMATOCRIT: HCT: 35.2 % — ABNORMAL LOW (ref 39.0–52.0)

## 2019-11-02 LAB — MAGNESIUM: Magnesium: 1.7 mg/dL (ref 1.7–2.4)

## 2019-11-02 LAB — HEMOGLOBIN: Hemoglobin: 11.5 g/dL — ABNORMAL LOW (ref 13.0–17.0)

## 2019-11-02 LAB — SARS CORONAVIRUS 2 BY RT PCR (HOSPITAL ORDER, PERFORMED IN ~~LOC~~ HOSPITAL LAB): SARS Coronavirus 2: NEGATIVE

## 2019-11-02 SURGERY — COLONOSCOPY WITH PROPOFOL
Anesthesia: Monitor Anesthesia Care

## 2019-11-02 MED ORDER — PROPOFOL 500 MG/50ML IV EMUL
INTRAVENOUS | Status: DC | PRN
  Administered 2019-11-02: 75 ug/kg/min via INTRAVENOUS

## 2019-11-02 MED ORDER — PROPOFOL 10 MG/ML IV BOLUS
INTRAVENOUS | Status: DC | PRN
  Administered 2019-11-02: 20 mg via INTRAVENOUS
  Administered 2019-11-02 (×3): 30 mg via INTRAVENOUS
  Administered 2019-11-02: 40 mg via INTRAVENOUS
  Administered 2019-11-02: 20 mg via INTRAVENOUS

## 2019-11-02 MED ORDER — PROMETHAZINE HCL 25 MG/ML IJ SOLN
25.0000 mg | Freq: Four times a day (QID) | INTRAMUSCULAR | Status: DC | PRN
  Administered 2019-11-02: 25 mg via INTRAVENOUS
  Filled 2019-11-02: qty 1

## 2019-11-02 MED ORDER — LIDOCAINE 2% (20 MG/ML) 5 ML SYRINGE
INTRAMUSCULAR | Status: DC | PRN
  Administered 2019-11-02: 60 mg via INTRAVENOUS

## 2019-11-02 MED ORDER — SODIUM CHLORIDE 0.9% IV SOLUTION: Freq: Once | INTRAVENOUS | Status: DC

## 2019-11-02 MED ORDER — SODIUM CHLORIDE 0.9% FLUSH
3.0000 mL | Freq: Two times a day (BID) | INTRAVENOUS | Status: DC
  Administered 2019-11-02: 3 mL via INTRAVENOUS

## 2019-11-02 MED ORDER — ONDANSETRON HCL 4 MG/2ML IJ SOLN: 4.0000 mg | Freq: Four times a day (QID) | INTRAMUSCULAR | Status: DC | PRN

## 2019-11-02 MED ORDER — SODIUM CHLORIDE 0.9 % IV SOLN: INTRAVENOUS | Status: DC

## 2019-11-02 MED ORDER — POLYETHYLENE GLYCOL 3350 17 GM/SCOOP PO POWD
1.0000 | Freq: Once | ORAL | Status: AC
  Administered 2019-11-02: 238 g via ORAL
  Filled 2019-11-02: qty 255

## 2019-11-02 MED ORDER — ACETAMINOPHEN 325 MG PO TABS: 650.0000 mg | ORAL_TABLET | Freq: Four times a day (QID) | ORAL | Status: DC | PRN

## 2019-11-02 MED ORDER — FENTANYL CITRATE (PF) 100 MCG/2ML IJ SOLN
50.0000 ug | Freq: Once | INTRAMUSCULAR | Status: AC
  Administered 2019-11-02: 50 ug via INTRAVENOUS
  Filled 2019-11-02: qty 2

## 2019-11-02 MED ORDER — SODIUM CHLORIDE 0.9 % IV BOLUS
1000.0000 mL | Freq: Once | INTRAVENOUS | Status: AC
  Administered 2019-11-02: 1000 mL via INTRAVENOUS

## 2019-11-02 MED ORDER — FENTANYL CITRATE (PF) 100 MCG/2ML IJ SOLN
25.0000 ug | INTRAMUSCULAR | Status: DC | PRN
  Administered 2019-11-02 (×2): 50 ug via INTRAVENOUS
  Filled 2019-11-02 (×2): qty 2

## 2019-11-02 MED ORDER — ONDANSETRON HCL 4 MG PO TABS: 4.0000 mg | ORAL_TABLET | Freq: Four times a day (QID) | ORAL | Status: DC | PRN

## 2019-11-02 MED ORDER — SODIUM CHLORIDE 0.9 % IV SOLN: INTRAVENOUS | Status: DC | PRN

## 2019-11-02 MED ORDER — ACETAMINOPHEN 650 MG RE SUPP: 650.0000 mg | Freq: Four times a day (QID) | RECTAL | Status: DC | PRN

## 2019-11-02 SURGICAL SUPPLY — 22 items

## 2019-11-02 NOTE — Consult Note (Addendum)
Referring Provider:  EDP Primary Care Physician:  [name] (Inactive) Primary Gastroenterologist:  Dr. Silverio Decamp  Reason for Consultation:  GI bleed  HPI: Kyle Berry is a 49 y.o. male with PMH listed below who presents to [facility] hospital with hematochezia.    He had both EGD and colonoscopy by Dr. Silverio Decamp just 3 days ago on July 16.  EGD was performed for nausea vomiting and epigastric abdominal pain.  Essentially unremarkable except for gastritis.  Colonoscopy was performed for screening purposes.  Prep was only fair.  He had a 20 mm polyp removed with hot snare from the cecum.  He also had a 30 mm polyp removed with hot snare from the rectosigmoid area and this was also clipped.  A couple of other smaller polyps removed as well.  He also had diverticulosis and internal hemorrhoids.  He says that Friday after his procedures he ate very light foods.  Friday evening he had a small amount of blood, but he was told that that was to be expected.  He says that Saturday he was fine and yesterday he was fine up until about 10:00 last night when he passed pure blood in the toilet.  This occurred 8 times before coming to the hospital.  Hemoglobin is 11.5 g this morning, down from 13.5 g about 2 weeks ago.  He was given bowel prep overnight for possible attempt at colonoscopy today.   Past Medical History:  Diagnosis Date  . Acute pancreatitis 11/23/2015  . Chronic bronchitis ([place])   . Chronic lower back pain   . Congenital single kidney   . GERD (gastroesophageal reflux disease)   . Headache    "once/month" (05/13/2017)  . High cholesterol   . History of kidney stones   . Hypertension   . Migraine    "a couple/year" (05/13/2017)  . Nephrolithiasis 05/13/2017  . Pneumonia ~ 09/2015  . Recurrent acute pancreatitis   . Renal disorder     Past Surgical History:  Procedure Laterality Date  . CHOLECYSTECTOMY N/A 10/23/2017   Procedure: LAPAROSCOPIC CHOLECYSTECTOMY WITH  INTRAOPERATIVE CHOLANGIOGRAM;  Surgeon: Johnathan Hausen, MD;  Location: [facility];  Service: General;  Laterality: N/A;  . NO PAST SURGERIES      Prior to Admission medications   Medication Sig Start Date End Date Taking? Authorizing Provider  acetaminophen (TYLENOL) 325 MG tablet Take 1-2 tablets by mouth 3 (three) times daily as needed for moderate pain or headache. 08/19/19  Yes [provider]  diphenhydrAMINE (BENADRYL) 25 MG tablet Take 25 mg by mouth daily as needed for itching or allergies.   Yes [provider]  HYDROcodone-acetaminophen (NORCO) 5-325 MG tablet Take 1 tablet by mouth every 4 (four) hours as needed for severe pain. 10/13/19  Yes Sherwood Gambler, MD  ibuprofen (ADVIL) 800 MG tablet Take 1 tablet (800 mg total) by mouth every 8 (eight) hours as needed. Patient taking differently: Take 800 mg by mouth every 8 (eight) hours as needed for moderate pain.  03/12/19  Yes Fransico Meadow, PA-C  lipase/protease/amylase (CREON) 36000 UNITS CPEP capsule Take 1 capsule (36,000 Units total) by mouth See admin instructions. Take two capsules (36,000 units) by mouth with meals (3 meals daily) and one capsule (36000 units) with snacks daily Patient taking differently: Take 36,000-72,000 Units by mouth See admin instructions. Take two capsules (36,000 units) by mouth with meals (3 meals daily) and one capsule (36000 units) with snacks daily 09/07/19  Yes Nandigam, Venia Minks, MD  ondansetron (ZOFRAN ODT) 4 MG disintegrating tablet Take 1 tablet (4 mg total) by mouth every 8 (eight) hours as needed for nausea or vomiting. 03/12/19  Yes Fransico Meadow, PA-C  promethazine (PHENERGAN) 25 MG tablet Take 1 tablet (25 mg total) by mouth every 6 (six) hours as needed for nausea or vomiting. 10/13/19  Yes Sherwood Gambler, MD  telmisartan (MICARDIS) 40 MG tablet Take 40 mg by mouth daily. 03/05/19  Yes [provider]  tetrahydrozoline 0.05 % ophthalmic solution Place 1 drop into both  eyes daily as needed (Dry eyes).   Yes [provider]    Current Facility-Administered Medications  Medication Dose Route Frequency Provider Last Rate Last Admin  . 0.9 %  sodium chloride infusion   Intravenous Continuous Opyd, Ilene Qua, MD 100 mL/hr at 11/02/19 0340 New Bag at 11/02/19 0340  . acetaminophen (TYLENOL) tablet 650 mg  650 mg Oral Q6H PRN Opyd, Ilene Qua, MD       Or  . acetaminophen (TYLENOL) suppository 650 mg  650 mg Rectal Q6H PRN Opyd, Ilene Qua, MD      . fentaNYL (SUBLIMAZE) injection 25-50 mcg  25-50 mcg Intravenous Q3H PRN Opyd, Ilene Qua, MD   50 mcg at 11/02/19 0811  . promethazine (PHENERGAN) injection 25 mg  25 mg Intravenous Q6H PRN Opyd, Ilene Qua, MD   25 mg at 11/02/19 0405  . sodium chloride flush (NS) 0.9 % injection 3 mL  3 mL Intravenous Q12H Opyd, Ilene Qua, MD   3 mL at 11/02/19 9211   Current Outpatient Medications  Medication Sig Dispense Refill  . acetaminophen (TYLENOL) 325 MG tablet Take 1-2 tablets by mouth 3 (three) times daily as needed for moderate pain or headache.    . diphenhydrAMINE (BENADRYL) 25 MG tablet Take 25 mg by mouth daily as needed for itching or allergies.    Marland Kitchen HYDROcodone-acetaminophen (NORCO) 5-325 MG tablet Take 1 tablet by mouth every 4 (four) hours as needed for severe pain. 10 tablet 0  . ibuprofen (ADVIL) 800 MG tablet Take 1 tablet (800 mg total) by mouth every 8 (eight) hours as needed. (Patient taking differently: Take 800 mg by mouth every 8 (eight) hours as needed for moderate pain. ) 30 tablet 0  . lipase/protease/amylase (CREON) 36000 UNITS CPEP capsule Take 1 capsule (36,000 Units total) by mouth See admin instructions. Take two capsules (36,000 units) by mouth with meals (3 meals daily) and one capsule (36000 units) with snacks daily (Patient taking differently: Take 36,000-72,000 Units by mouth See admin instructions. Take two capsules (36,000 units) by mouth with meals (3 meals daily) and one capsule (36000  units) with snacks daily) 210 capsule 6  . ondansetron (ZOFRAN ODT) 4 MG disintegrating tablet Take 1 tablet (4 mg total) by mouth every 8 (eight) hours as needed for nausea or vomiting. 20 tablet 0  . promethazine (PHENERGAN) 25 MG tablet Take 1 tablet (25 mg total) by mouth every 6 (six) hours as needed for nausea or vomiting. 15 tablet 0  . telmisartan (MICARDIS) 40 MG tablet Take 40 mg by mouth daily.    Marland Kitchen tetrahydrozoline 0.05 % ophthalmic solution Place 1 drop into both eyes daily as needed (Dry eyes).      Allergies as of 11/01/2019 - Review Complete 11/01/2019  Allergen Reaction Noted  . Diclofenac Hives 12/14/2013  . Norvasc [amlodipine besylate] Other (See Comments) 08/15/2016  . Omeprazole Other (See Comments) 08/15/2016  . Prednisone Other (See Comments) 12/14/2013  Family History  Problem Relation Age of Onset  . Hypertension Other   . Hypertension Mother   . Hypertension Father   . Kidney disease Father   . Hypertension Sister   . Diabetes Sister   . Hypertension Brother     Social History   Socioeconomic History  . Marital status: Single    Spouse name: Not on file  . Number of children: Not on file  . Years of education: Not on file  . Highest education level: Not on file  Occupational History  . Not on file  Tobacco Use  . Smoking status: Never Smoker  . Smokeless tobacco: Never Used  Vaping Use  . Vaping Use: Never used  Substance and Sexual Activity  . Alcohol use: Not Currently    Comment: 05/13/2017 "might have beer a few times/year"  . Drug use: No  . Sexual activity: Yes    Birth control/protection: Condom  Other Topics Concern  . Not on file  Social History Narrative  . Not on file   Social Determinants of Health   Financial Resource Strain:   . Difficulty of Paying Living Expenses:   Food Insecurity:   . Worried About Charity fundraiser in the Last Year:   . Arboriculturist in the Last Year:   Transportation Needs:   . Lexicographer (Medical):   Marland Kitchen Lack of Transportation (Non-Medical):   Physical Activity:   . Days of Exercise per Week:   . Minutes of Exercise per Session:   Stress:   . Feeling of Stress :   Social Connections:   . Frequency of Communication with Friends and Family:   . Frequency of Social Gatherings with Friends and Family:   . Attends Religious Services:   . Active Member of Clubs or Organizations:   . Attends Archivist Meetings:   Marland Kitchen Marital Status:   Intimate Partner Violence:   . Fear of Current or Ex-Partner:   . Emotionally Abused:   Marland Kitchen Physically Abused:   . Sexually Abused:    Review of Systems: ROS is O/W negative except as mentioned in HPI.  Physical Exam: Vital signs in last 24 hours: Temp:  [98.3 F (36.8 C)] 98.3 F (36.8 C) (07/18 2353) Pulse Rate:  [73-99] 73 (07/19 0809) Resp:  [15-26] 15 (07/19 0809) BP: (113-146)/(72-115) 113/72 (07/19 0808) SpO2:  [96 %-100 %] 98 % (07/19 0809)   General:  Alert, Well-developed, well-nourished, pleasant and cooperative in NAD Head:  Normocephalic and atraumatic. Eyes:  Sclera clear, no icterus.  Conjunctiva pink. Ears:  Normal auditory acuity. Mouth:  No deformity or lesions.   Lungs:  Clear throughout to auscultation.  No wheezes, crackles, or rhonchi.  Heart:  Regular rate and rhythm; no murmurs, clicks, rubs, or gallops. Abdomen:  Soft, non-distended.  BS present and hyperactive.  Non-tender.   Rectal:  Deferred.  Msk:  Symmetrical without gross deformities. Pulses:  Normal pulses noted. Extremities:  Without clubbing or edema. Neurologic:  Alert and oriented x 4;  grossly normal neurologically. Skin:  Intact without significant lesions or rashes. Psych:  Alert and cooperative. Normal mood and affect.  Intake/Output from previous day: 07/18 0701 - 07/19 0700 In: 1000 [IV Piggyback:1000] Out: -   Lab Results: Recent Labs    11/01/19 2355 11/02/19 0334  WBC 6.4  --   HGB 12.8* 11.5*  HCT  38.6* 35.2*  PLT 243  --    BMET Recent Labs  11/01/19 2355 11/02/19 0334  NA 137 139  K 3.9 4.2  CL 105 109  CO2 22 21*  GLUCOSE 105* 124*  BUN 12 13  CREATININE 1.44* 1.37*  CALCIUM 9.2 8.5*   LFT Recent Labs    11/01/19 2355  PROT 6.9  ALBUMIN 3.8  AST 22  ALT 21  ALKPHOS 58  BILITOT 0.5   PT/INR Recent Labs    11/02/19 0233  LABPROT 13.8  INR 1.1   IMPRESSION:  *GI bleed: Suspect lower source, post polypectomy bleed.  Colonoscopy 7/16 with 20 mm cecal polyp removed with hot snare and a 30 mm rectosigmoid polyp removed with hot snare, this is also clipped.  He is hemodynamically stable. *Acute blood loss anemia: Hemoglobin down 2 g from 2 weeks ago.  11.5 g this morning.  PLAN: *Patient has already been prepped for colonoscopy.  We will plan for this later today with intervention if possible.  Laban Emperor. Zehr  11/02/2019, 9:00 AM  GI ATTENDING  History, laboratories, recent colonoscopy report reviewed.  Patient seen and examined.  Agree with comprehensive consultation note as outlined above.  Patient presents today with hemodynamically stable post polypectomy bleed which began last evening.  Has been given bowel purge.  Significant lesions from his colonoscopy 3 days ago included a large pedunculated cecal lesion as well as a large rectosigmoid lesion removed with EMR and clip.  Plan for urgent colonoscopy for potential therapeutics as indicated.The nature of the procedure, as well as the risks, benefits, and alternatives were carefully and thoroughly reviewed with the patient. Ample time for discussion and questions allowed. The patient understood, was satisfied, and agreed to proceed.  Docia Chuck. Geri Seminole., M.D. [facility] Division of Gastroenterology

## 2019-11-02 NOTE — ED Notes (Signed)
Pt transported to endo.  

## 2019-11-02 NOTE — ED Provider Notes (Signed)
[facility] EMERGENCY DEPARTMENT Provider Note   CSN: [id number] Arrival date & time: 11/01/19  2327     History Chief Complaint  Patient presents with  . Rectal Bleeding    Kyle Berry is a 49 y.o. male.  Patient presents to the emergency department with a chief complaint of GI bleeding.  He states that he has been having multiple episodes of bloody bowel movements, that are essentially pure blood for the past several hours.  He reports having had 7-8 episodes.  He has had grossly bloody BMs in the emergency department as well.  He reports having had colonoscopy 2 days ago.  His GI doctor is Dr. Silverio Decamp with [facility].  He is not anticoagulated.  He reports mild associated abdominal pain.  The history is provided by the patient. No language interpreter was used.       Past Medical History:  Diagnosis Date  . Acute pancreatitis 11/23/2015  . Chronic bronchitis ([place])   . Chronic lower back pain   . Congenital single kidney   . GERD (gastroesophageal reflux disease)   . Headache    "once/month" (05/13/2017)  . High cholesterol   . History of kidney stones   . Hypertension   . Migraine    "a couple/year" (05/13/2017)  . Nephrolithiasis 05/13/2017  . Pneumonia ~ 09/2015  . Recurrent acute pancreatitis   . Renal disorder     Patient Active Problem List   Diagnosis Date Noted  . Rectal bleeding 10/14/2019  . Pancreatic insufficiency 05/28/2019  . Recurrent acute pancreatitis 10/17/2018  . Microscopic hematuria 10/17/2018  . Acute pancreatitis 07/23/2018  . Pancreatic divisum   . Hematemesis 10/18/2017  . Recurrent pancreatitis 05/13/2017  . Congenital single kidney 05/13/2017  . High cholesterol 05/13/2017  . Nephrolithiasis 05/13/2017  . Acute on chronic pancreatitis ([place]) 01/17/2017  . Acute recurrent pancreatitis   . Hypokalemia   . Nausea and vomiting   . Essential hypertension   . Esophageal reflux   . Chronic pancreatitis ([place])  11/23/2015    Past Surgical History:  Procedure Laterality Date  . CHOLECYSTECTOMY N/A 10/23/2017   Procedure: LAPAROSCOPIC CHOLECYSTECTOMY WITH INTRAOPERATIVE CHOLANGIOGRAM;  Surgeon: Johnathan Hausen, MD;  Location: [facility] ORS;  Service: General;  Laterality: N/A;  . NO PAST SURGERIES         Family History  Problem Relation Age of Onset  . Hypertension Other   . Hypertension Mother   . Hypertension Father   . Kidney disease Father   . Hypertension Sister   . Diabetes Sister   . Hypertension Brother     Social History   Tobacco Use  . Smoking status: Never Smoker  . Smokeless tobacco: Never Used  Vaping Use  . Vaping Use: Never used  Substance Use Topics  . Alcohol use: Not Currently    Comment: 05/13/2017 "might have beer a few times/year"  . Drug use: No    Home Medications Prior to Admission medications   Medication Sig Start Date End Date Taking? Authorizing Provider  acetaminophen (TYLENOL) 325 MG tablet Take 1-2 tablets by mouth 3 (three) times daily as needed for moderate pain or headache. 08/19/19   [provider]  diphenhydrAMINE (BENADRYL) 25 MG tablet Take 25 mg by mouth daily as needed for itching or allergies.    [provider]  HYDROcodone-acetaminophen (NORCO) 5-325 MG tablet Take 1 tablet by mouth every 4 (four) hours as needed for severe pain. 10/13/19   Sherwood Gambler, MD  ibuprofen (ADVIL) 800 MG tablet Take 1 tablet (800 mg total) by mouth every 8 (eight) hours as needed. Patient taking differently: Take 800 mg by mouth every 8 (eight) hours as needed for moderate pain.  03/12/19   Fransico Meadow, PA-C  lipase/protease/amylase (CREON) 36000 UNITS CPEP capsule Take 1 capsule (36,000 Units total) by mouth See admin instructions. Take two capsules (36,000 units) by mouth with meals (3 meals daily) and one capsule (36000 units) with snacks daily 09/07/19   Nandigam, Venia Minks, MD  ondansetron (ZOFRAN ODT) 4 MG disintegrating tablet Take 1  tablet (4 mg total) by mouth every 8 (eight) hours as needed for nausea or vomiting. 03/12/19   Fransico Meadow, PA-C  promethazine (PHENERGAN) 25 MG tablet Take 1 tablet (25 mg total) by mouth every 6 (six) hours as needed for nausea or vomiting. 10/13/19   Sherwood Gambler, MD  telmisartan (MICARDIS) 40 MG tablet Take 40 mg by mouth daily. 03/05/19   [provider]  tetrahydrozoline 0.05 % ophthalmic solution Place 1 drop into both eyes daily as needed (Dry eyes).    [provider]    Allergies    Diclofenac, Norvasc [amlodipine besylate], Omeprazole, and Prednisone  Review of Systems   Review of Systems  All other systems reviewed and are negative.   Physical Exam Updated Vital Signs BP (!) 146/113 (BP Location: Left Arm)   Pulse 93   Temp 98.3 F (36.8 C) (Oral)   Resp 18   SpO2 100%   Physical Exam Vitals and nursing note reviewed.  Constitutional:      Appearance: He is well-developed.  HENT:     Head: Normocephalic and atraumatic.  Eyes:     Conjunctiva/sclera: Conjunctivae normal.  Cardiovascular:     Rate and Rhythm: Normal rate and regular rhythm.     Heart sounds: No murmur heard.   Pulmonary:     Effort: Pulmonary effort is normal. No respiratory distress.     Breath sounds: Normal breath sounds.  Abdominal:     Palpations: Abdomen is soft.     Tenderness: There is abdominal tenderness.     Comments: Mild lower abdominal tenderness  Musculoskeletal:        General: Normal range of motion.     Cervical back: Neck supple.  Skin:    General: Skin is warm and dry.  Neurological:     Mental Status: He is alert and oriented to person, place, and time.  Psychiatric:        Mood and Affect: Mood normal.        Behavior: Behavior normal.     ED Results / Procedures / Treatments   Labs (all labs ordered are listed, but only abnormal results are displayed) Labs Reviewed  COMPREHENSIVE METABOLIC PANEL - Abnormal; Notable for the following  components:      Result Value   Glucose, Bld 105 (*)    Creatinine, Ser 1.44 (*)    GFR calc non Af Amer 56 (*)    All other components within normal limits  CBC - Abnormal; Notable for the following components:   RBC 3.96 (*)    Hemoglobin 12.8 (*)    HCT 38.6 (*)    All other components within normal limits  PROTIME-INR  POC OCCULT BLOOD, ED  TYPE AND SCREEN    EKG None  Radiology No results found.  Procedures Procedures (including critical care time)  Medications Ordered in ED Medications - No data to display  ED Course  I have reviewed the triage vital signs and the nursing notes.  Pertinent labs & imaging results that were available during my care of the patient were reviewed by me and considered in my medical decision making (see chart for details).    MDM Rules/Calculators/A&P                         Patient with acute GI bleeding.  He had a colonoscopy 2 days ago.  He reports that the bleeding started today.  It has gradually worsened.  Laboratory work-up notable for no significant anemia.  His hemoglobin is 12.8, which is about baseline for patient.  His vitals are stable.  He is not hypotensive nor tachycardic.  Case discussed with Dr. Leonides Schanz, who agrees with plan to consult gastroenterology.   2:46 AM I discussed the case with Dr. Fuller Plan, who recommends admission to the hospital.  Given that the patient has stable vital signs, he plans to have the patient seen in the morning with repeat colonoscopy.  Recommends starting bowel prep now.  If anything should change regarding the patient's vitals, or if you should decompensate, he wants to be called back.   3:21 AM Appreciate Dr. Myna Hidalgo for admitting the patient.  Plan for colonoscopy in the AM.  Final Clinical Impression(s) / ED Diagnoses Final diagnoses:  Acute GI bleeding    Rx / DC Orders ED Discharge Orders    None       [name], PA-C 11/02/19 0321    Ward, Delice Bison, DO 11/02/19  9012

## 2019-11-02 NOTE — Op Note (Signed)
[facility] Patient Name: Kyle Berry Procedure Date : 11/02/2019 MRN: [id number] Attending MD: Docia Chuck. Henrene Pastor , MD Date of Birth: 1970-10-09 CSN: [id number] Age: 49 Admit Type: Inpatient Procedure:                Colonoscopy with control of bleeding. Endoscopic                            clipping x 7 Indications:              Treatment of bleeding from polypectomy site.                            Colonoscopy with hot polypectomy of large cecal and                            sigmoid colon lesions October 30, 2019 (Dr. Silverio Decamp) Providers:                Docia Chuck. Henrene Pastor, MD, Cletis Athens, Technician, Tyna Jaksch Technician Referring MD:             [facility] Medicines:                Monitored Anesthesia Care Complications:            No immediate complications. Estimated blood loss:                            None. Estimated Blood Loss:     Estimated blood loss: none. Procedure:                Pre-Anesthesia Assessment:                           - Prior to the procedure, a History and Physical                            was performed, and patient medications and                            allergies were reviewed. The patient's tolerance of                            previous anesthesia was also reviewed. The risks                            and benefits of the procedure and the sedation                            options and risks were discussed with the patient.                            All questions were answered, and informed consent  was obtained. Prior Anticoagulants: The patient has                            taken no previous anticoagulant or antiplatelet                            agents. ASA Grade Assessment: II - A patient with                            mild systemic disease. After reviewing the risks                            and benefits, the patient was deemed in                             satisfactory condition to undergo the procedure.                           After obtaining informed consent, the colonoscope                            was passed under direct vision. Throughout the                            procedure, the patient's blood pressure, pulse, and                            oxygen saturations were monitored continuously. The                            CF-HQ190L ([id number]) Olympus colonoscope was                            introduced through the anus and advanced to the the                            cecum, identified by appendiceal orifice and                            ileocecal valve. The ileocecal valve, appendiceal                            orifice, and rectum were photographed. The quality                            of the bowel preparation was good. The colonoscopy                            was performed without difficulty. The patient                            tolerated the procedure well. The bowel preparation  used was MoviPrep. Scope In: Scope Out: Findings:      An area of ulcerated mucosa was found in the cecum at the prior       polypectomy site. There was a pigmented protuberance but no active       bleeding. For hemostasis, four hemostatic clips were successfully       placed. There was no bleeding at the end of the procedure.      An area of mucosa was found in the sigmoid colon at prior polypectomy       site. There was a pigmented protuberance but no active bleeding.       Previous clips had fallen off. For hemostasis, three hemostatic clips       were successfully placed. There was no bleeding at the end of the       procedure.      Multiple diverticula were found in the cecum, left colon and right colon.      There was a small clip in the sigmoid colon above larger polypectomy       site. No mucosal defect in that region. The exam was otherwise without       abnormality on direct and retroflexion  views. Impression:               1. Post polypectomy bleed. Likely culprit in the                            cecum treated with clipping due to the presence of                            stigmata. Also, clipping of large sigmoid colon                            polypectomy site as described. No active bleeding                            at the time of the procedure. Hemostasis maintained                            post therapy                           2. Diverticulosis. Recommendation:           - Repeat colonoscopy date to be determined after                            pending pathology results are reviewed for                            surveillance (per Dr. Silverio Decamp).                           - Observe for evidence of rebleeding.                           - Resume previous diet.                           -  Could go home late this afternoon, if doing well.                           - Recommend that he rest and stay out of work for                            72 hours Procedure Code(s):        --- Professional ---                           [phone], Colonoscopy, flexible; with control of                            bleeding, any method Diagnosis Code(s):        --- Professional ---                           K63.3, Ulcer of intestine                           K63.89, Other specified diseases of intestine                           K91.840, Postprocedural hemorrhage of a digestive                            system organ or structure following a digestive                            system procedure                           K57.30, Diverticulosis of large intestine without                            perforation or abscess without bleeding CPT copyright 2019 American Medical Association. All rights reserved. The codes documented in this report are preliminary and upon coder review may  be revised to meet current compliance requirements. Docia Chuck. Henrene Pastor, MD 11/02/2019 12:31:45 PM This report has  been signed electronically. Number of Addenda: 0

## 2019-11-02 NOTE — Transfer of Care (Signed)
Immediate Anesthesia Transfer of Care Note  Patient: Kyle Berry  Procedure(s) Performed: COLONOSCOPY WITH PROPOFOL (N/A ) HEMOSTASIS CLIP PLACEMENT  Patient Location: Endoscopy Unit  Anesthesia Type:MAC  Level of Consciousness: awake, alert , oriented and patient cooperative  Airway & Oxygen Therapy: Patient Spontanous Breathing and Patient connected to nasal cannula oxygen  Post-op Assessment: Report given to RN and Post -op Vital signs reviewed and stable  Post vital signs: Reviewed and stable  Last Vitals:  Vitals Value Taken Time  BP 92/67 11/02/19 1226  Temp    Pulse 85 11/02/19 1226  Resp 17 11/02/19 1226  SpO2 99 % 11/02/19 1226    Last Pain:  Vitals:   11/02/19 1107  TempSrc: Oral  PainSc: 7          Complications: No complications documented.

## 2019-11-02 NOTE — Anesthesia Preprocedure Evaluation (Addendum)
Anesthesia Evaluation  Patient identified by MRN, date of birth, ID band Patient awake    Reviewed: Allergy & Precautions, NPO status , Patient's Chart, lab work & pertinent test results  Airway Mallampati: II  TM Distance: >3 FB Neck ROM: Full    Dental  (+) Dental Advisory Given, Poor Dentition, Chipped,    Pulmonary  Bronchitis    Pulmonary exam normal breath sounds clear to auscultation       Cardiovascular hypertension, Pt. on medications Normal cardiovascular exam Rhythm:Regular Rate:Normal     Neuro/Psych  Headaches, negative psych ROS   GI/Hepatic GERD  ,Recurrent acute pancreatitis GIB   Endo/Other  negative endocrine ROS  Renal/GU Renal InsufficiencyRenal diseaseCongenital single kidney     Musculoskeletal negative musculoskeletal ROS (+)   Abdominal   Peds  Hematology  (+) Blood dyscrasia, anemia ,   Anesthesia Other Findings Day of surgery medications reviewed with the patient.  Reproductive/Obstetrics                            Anesthesia Physical Anesthesia Plan  ASA: III  Anesthesia Plan: MAC   Post-op Pain Management:    Induction: Intravenous  PONV Risk Score and Plan: 1 and Treatment may vary due to age or medical condition and Propofol infusion  Airway Management Planned: Nasal Cannula  Additional Equipment:   Intra-op Plan:   Post-operative Plan:   Informed Consent: I have reviewed the patients History and Physical, chart, labs and discussed the procedure including the risks, benefits and alternatives for the proposed anesthesia with the patient or authorized representative who has indicated his/her understanding and acceptance.     Dental advisory given  Plan Discussed with: CRNA and Anesthesiologist  Anesthesia Plan Comments:         Anesthesia Quick Evaluation

## 2019-11-02 NOTE — Discharge Instructions (Signed)
Colonoscopy, Adult, Care After This sheet gives you information about how to care for yourself after your procedure. Your doctor may also give you more specific instructions. If you have problems or questions, call your doctor. What can I expect after the procedure? After the procedure, it is common to have:  A small amount of blood in your poop (stool) for 24 hours.  Some gas.  Mild cramping or bloating in your belly (abdomen). Follow these instructions at home: Eating and drinking   Drink enough fluid to keep your pee (urine) pale yellow.  Follow instructions from your doctor about what you cannot eat or drink.  Return to your normal diet as told by your doctor. Avoid heavy or fried foods that are hard to digest. Activity  Rest as told by your doctor.  Do not sit for a long time without moving. Get up to take short walks every 1-2 hours. This is important. Ask for help if you feel weak or unsteady.  Return to your normal activities as told by your doctor. Ask your doctor what activities are safe for you. To help cramping and bloating:   Try walking around.  Put heat on your belly as told by your doctor. Use the heat source that your doctor recommends, such as a moist heat pack or a heating pad. ? Put a towel between your skin and the heat source. ? Leave the heat on for 20-30 minutes. ? Remove the heat if your skin turns bright red. This is very important if you are unable to feel pain, heat, or cold. You may have a greater risk of getting burned. General instructions  For the first 24 hours after the procedure: ? Do not drive or use machinery. ? Do not sign important documents. ? Do not drink alcohol. ? Do your daily activities more slowly than normal. ? Eat foods that are soft and easy to digest.  Take over-the-counter or prescription medicines only as told by your doctor.  Keep all follow-up visits as told by your doctor. This is important. Contact a doctor  if:  You have blood in your poop 2-3 days after the procedure. Get help right away if:  You have more than a small amount of blood in your poop.  You see large clumps of tissue (blood clots) in your poop.  Your belly is swollen.  You feel like you may vomit (nauseous).  You vomit.  You have a fever.  You have belly pain that gets worse, and medicine does not help your pain. Summary  After the procedure, it is common to have a small amount of blood in your poop. You may also have mild cramping and bloating in your belly.  For the first 24 hours after the procedure, do not drive or use machinery, do not sign important documents, and do not drink alcohol.  Get help right away if you have a lot of blood in your poop, feel like you may vomit, have a fever, or have more belly pain. This information is not intended to replace advice given to you by your health care provider. Make sure you discuss any questions you have with your health care provider. Document Revised: 10/27/2018 Document Reviewed: 10/27/2018 Elsevier Patient Education  2020 [employer]  

## 2019-11-02 NOTE — H&P (Signed)
History and Physical    [name]:[id number] DOB: 05/03/1970 DOA: 11/01/2019  PCP: [name] (Inactive)   Patient coming from: Home   Chief Complaint: Bright red blood per rectum, lightheadedness   HPI: Kyle Berry is a 49 y.o. male with medical history significant for chronic pancreatitis, hypertension, and status post EGD and colonoscopy with polypectomy on 10/30/2019, now presenting to the emergency department with bright red blood per rectum and lightheadedness.  Patient tolerated the GI procedures well and had been doing okay at home until approximately 10 PM last night when he felt an urge to move his bowels and had what he describes as a large amount of bright red blood.  He went on to have 8 more of these episodes at home, developed some lightheadedness, called his GI clinic, and was directed to the ED for further evaluation.  Patient reports some sharp pain in the left upper quadrant of his abdomen, is beginning to develop some nausea but has not vomited, and denies any chest pain, shortness of breath, cough, fevers, or chills.  ED Course: Upon arrival to the ED, patient is found to be afebrile, saturating well on room air, heart rate in the upper 90s, and blood pressure 140/110.  Chemistry panel is notable for creatinine 1.44, up from 1.06 two weeks ago.  CBC with hemoglobin 12.8, up from 11.5 two weeks ago.  COVID-19 screening test not yet resulted.  Patient continues to have bright red blood per rectum and gastroenterology was consulted by the ED physician, recommending medical admission and bowel prep.  Review of Systems:  All other systems reviewed and apart from HPI, are negative.  Past Medical History:  Diagnosis Date   Acute pancreatitis 11/23/2015   Chronic bronchitis (HCC)    Chronic lower back pain    Congenital single kidney    GERD (gastroesophageal reflux disease)    Headache    "once/month" (05/13/2017)   High cholesterol     History of kidney stones    Hypertension    Migraine    "a couple/year" (05/13/2017)   Nephrolithiasis 05/13/2017   Pneumonia ~ 09/2015   Recurrent acute pancreatitis    Renal disorder     Past Surgical History:  Procedure Laterality Date   CHOLECYSTECTOMY N/A 10/23/2017   Procedure: LAPAROSCOPIC CHOLECYSTECTOMY WITH INTRAOPERATIVE CHOLANGIOGRAM;  Surgeon: Johnathan Hausen, MD;  Location: [facility];  Service: General;  Laterality: N/A;   NO PAST SURGERIES      Social History:   reports that he has never smoked. He has never used smokeless tobacco. He reports previous alcohol use. He reports that he does not use drugs.  Allergies  Allergen Reactions   Diclofenac Hives   Norvasc [Amlodipine Besylate] Other (See Comments)    Fluid buildup in chest   Omeprazole Other (See Comments)    MD stopped due to pancreatitis   Prednisone Other (See Comments)    Mood swings    Family History  Problem Relation Age of Onset   Hypertension Other    Hypertension Mother    Hypertension Father    Kidney disease Father    Hypertension Sister    Diabetes Sister    Hypertension Brother      Prior to Admission medications   Medication Sig Start Date End Date Taking? Authorizing Provider  acetaminophen (TYLENOL) 325 MG tablet Take 1-2 tablets by mouth 3 (three) times daily as needed for moderate pain or headache. 08/19/19   [provider]  diphenhydrAMINE (BENADRYL)  25 MG tablet Take 25 mg by mouth daily as needed for itching or allergies.    [provider]  HYDROcodone-acetaminophen (NORCO) 5-325 MG tablet Take 1 tablet by mouth every 4 (four) hours as needed for severe pain. 10/13/19   Sherwood Gambler, MD  ibuprofen (ADVIL) 800 MG tablet Take 1 tablet (800 mg total) by mouth every 8 (eight) hours as needed. Patient taking differently: Take 800 mg by mouth every 8 (eight) hours as needed for moderate pain.  03/12/19   Fransico Meadow, PA-C  lipase/protease/amylase  (CREON) 36000 UNITS CPEP capsule Take 1 capsule (36,000 Units total) by mouth See admin instructions. Take two capsules (36,000 units) by mouth with meals (3 meals daily) and one capsule (36000 units) with snacks daily 09/07/19   Nandigam, Venia Minks, MD  ondansetron (ZOFRAN ODT) 4 MG disintegrating tablet Take 1 tablet (4 mg total) by mouth every 8 (eight) hours as needed for nausea or vomiting. 03/12/19   Fransico Meadow, PA-C  promethazine (PHENERGAN) 25 MG tablet Take 1 tablet (25 mg total) by mouth every 6 (six) hours as needed for nausea or vomiting. 10/13/19   Sherwood Gambler, MD  telmisartan (MICARDIS) 40 MG tablet Take 40 mg by mouth daily. 03/05/19   [provider]  tetrahydrozoline 0.05 % ophthalmic solution Place 1 drop into both eyes daily as needed (Dry eyes).    [provider]    Physical Exam: Vitals:   11/01/19 2353 11/02/19 0136 11/02/19 0300  BP: (!) 138/115 (!) 146/113 (!) 142/99  Pulse: 99 93 90  Resp: 18  17  Temp: 98.3 F (36.8 C)    TempSrc: Oral    SpO2: 100% 100% 99%    Constitutional: NAD, calm  Eyes: PERTLA, lids and conjunctivae normal ENMT: Mucous membranes are moist. Posterior pharynx clear of any exudate or lesions.   Neck: normal, supple, no masses, no thyromegaly Respiratory: no wheezing, no crackles. No accessory muscle use.  Cardiovascular: S1 & S2 heard, regular rate and rhythm. No extremity edema.   Abdomen: No distension, soft, tender in LUQ without rebound pain or guarding. Bowel sounds active.  Musculoskeletal: no clubbing / cyanosis. No joint deformity upper and lower extremities.   Skin: no significant rashes, lesions, ulcers. Warm, dry, well-perfused. Neurologic: CN 2-12 grossly intact. Sensation intact. Moving all extremities.  Psychiatric: Alert and oriented to person, place, and situation. Pleasant and cooperative.    Labs and Imaging on Admission: I have personally reviewed following labs and imaging  studies  CBC: Recent Labs  Lab 11/01/19 2355  WBC 6.4  HGB 12.8*  HCT 38.6*  MCV 97.5  PLT 948   Basic Metabolic Panel: Recent Labs  Lab 11/01/19 2355  NA 137  K 3.9  CL 105  CO2 22  GLUCOSE 105*  BUN 12  CREATININE 1.44*  CALCIUM 9.2   GFR: Estimated Creatinine Clearance: 64.9 mL/min (A) (by C-G formula based on SCr of 1.44 mg/dL (H)). Liver Function Tests: Recent Labs  Lab 11/01/19 2355  AST 22  ALT 21  ALKPHOS 58  BILITOT 0.5  PROT 6.9  ALBUMIN 3.8   No results for input(s): LIPASE, AMYLASE in the last 168 hours. No results for input(s): AMMONIA in the last 168 hours. Coagulation Profile: No results for input(s): INR, PROTIME in the last 168 hours. Cardiac Enzymes: No results for input(s): CKTOTAL, CKMB, CKMBINDEX, TROPONINI in the last 168 hours. BNP (last 3 results) No results for input(s): PROBNP in the last 8760 hours.  HbA1C: No results for input(s): HGBA1C in the last 72 hours. CBG: No results for input(s): GLUCAP in the last 168 hours. Lipid Profile: No results for input(s): CHOL, HDL, LDLCALC, TRIG, CHOLHDL, LDLDIRECT in the last 72 hours. Thyroid Function Tests: No results for input(s): TSH, T4TOTAL, FREET4, T3FREE, THYROIDAB in the last 72 hours. Anemia Panel: No results for input(s): VITAMINB12, FOLATE, FERRITIN, TIBC, IRON, RETICCTPCT in the last 72 hours. Urine analysis:    Component Value Date/Time   COLORURINE YELLOW 10/13/2019 0613   APPEARANCEUR CLEAR 10/13/2019 0613   LABSPEC 1.026 10/13/2019 0613   PHURINE 5.0 10/13/2019 0613   GLUCOSEU NEGATIVE 10/13/2019 0613   HGBUR NEGATIVE 10/13/2019 0613   BILIRUBINUR NEGATIVE 10/13/2019 0613   KETONESUR NEGATIVE 10/13/2019 0613   PROTEINUR NEGATIVE 10/13/2019 0613   UROBILINOGEN 0.2 01/20/2015 1643   NITRITE NEGATIVE 10/13/2019 0613   LEUKOCYTESUR NEGATIVE 10/13/2019 0613   Sepsis Labs: @LABRCNTIP (procalcitonin:4,lacticidven:4) )No results found for this or any previous visit (from  the past 240 hour(s)).   Radiological Exams on Admission: No results found.   Assessment/Plan   1. Postpolypectomy bleeding; near syncope  - Presents with 9 episodes of BRBPR at home last night with ongoing bleeding in ED after colonoscopy with polypectomy on 10/30/19  - He reports lightheadedness but no LOC or chest pain  - GI is consulting and much appreciated, recommending bowel prep  - Start IVF hydration, type and screen, monitor H&H, start bowel prep   2. Mild renal insufficiency  - SCr is 1.44 in ED, up from 1.06 two weeks prior  - Likely acute prerenal azotemia in setting of GIB  - Start IVF hydration, monitor   3. Hypertension  - Hold antihypertensives initially in setting of active GIB with lightheadedness    DVT prophylaxis: SCDs Code Status: Full  Family Communication: Discussed with patient  Disposition Plan:  Patient is from: Home  Anticipated d/c is to: Home  Anticipated d/c date is: Possibly as early as 11/03/19 Patient currently: Has active GI bleeding, pending GI consultation and H&H monitoring  Consults called: GI  Admission status: Observation     Vianne Bulls, MD [facility]  11/02/2019, 3:20 AM

## 2019-11-02 NOTE — Anesthesia Procedure Notes (Signed)
Procedure Name: MAC Date/Time: 11/02/2019 11:47 AM Performed by: Trinna Post., CRNA Pre-anesthesia Checklist: Patient identified, Emergency Drugs available, Suction available, Patient being monitored and Timeout performed Patient Re-evaluated:Patient Re-evaluated prior to induction Oxygen Delivery Method: Nasal cannula Preoxygenation: Pre-oxygenation with 100% oxygen Induction Type: IV induction Placement Confirmation: positive ETCO2

## 2019-11-02 NOTE — ED Notes (Signed)
Pt states that he is dizzy and seeing spots.  Pt has had 9 bloody stools today.  Vital signs reevaluated in triage

## 2019-11-02 NOTE — ED Notes (Signed)
Dr Tat has seen pt.

## 2019-11-02 NOTE — Anesthesia Postprocedure Evaluation (Signed)
Anesthesia Post Note  Patient: Kyle Berry  Procedure(s) Performed: COLONOSCOPY WITH PROPOFOL (N/A ) HEMOSTASIS CLIP PLACEMENT     Patient location during evaluation: Endoscopy Anesthesia Type: MAC Level of consciousness: awake and alert Pain management: pain level controlled Vital Signs Assessment: post-procedure vital signs reviewed and stable Respiratory status: spontaneous breathing, nonlabored ventilation and respiratory function stable Cardiovascular status: stable and blood pressure returned to baseline Postop Assessment: no apparent nausea or vomiting Anesthetic complications: no   No complications documented.  Last Vitals:  Vitals:   11/02/19 1245 11/02/19 1255  BP:  101/81  Pulse: 67 74  Resp: 16 11  Temp:    SpO2: 100% 100%    Last Pain:  Vitals:   11/02/19 1255  TempSrc:   PainSc: 0-No pain                 Catalina Gravel

## 2019-11-02 NOTE — Discharge Summary (Signed)
Physician Discharge Summary  Kyle Berry BHA:[id number] DOB: 1970/09/22 DOA: 11/01/2019  PCP: [name] (Inactive)  Admit date: 11/01/2019 Discharge date: 11/02/2019  Admitted From: Home Disposition:  Home   Recommendations for Outpatient Follow-up:  1. Follow up with PCP in 1-2 weeks 2. Please obtain BMP/CBC in one week     Discharge Condition: Stable CODE STATUS: FULL Diet recommendation: Heart Healthy    Brief/Interim Summary: 49 y.o. male with medical history significant for chronic pancreatitis, hypertension, and status post EGD and colonoscopy with polypectomy on 10/30/2019, now presenting to the emergency department with bright red blood per rectum and lightheadedness.  Patient tolerated the GI procedures well and had been doing okay at home until approximately 10 PM last night when he felt an urge to move his bowels and had what he describes as a large amount of bright red blood.  He went on to have 8 more of these episodes at home, developed some lightheadedness, called his GI clinic, and was directed to the ED for further evaluation.  Patient reports some sharp pain in the left upper quadrant of his abdomen, is beginning to develop some nausea but has not vomited, and denies any chest pain, shortness of breath, cough, fevers, or chills.  ED Course: Upon arrival to the ED, patient is found to be afebrile, saturating well on room air, heart rate in the upper 90s, and blood pressure 140/110.  Chemistry panel is notable for creatinine 1.44, up from 1.06 two weeks ago.  CBC with hemoglobin 12.8, up from 11.5 two weeks ago.  COVID-19 screening test not yet resulted.  Patient continues to have bright red blood per rectum and gastroenterology was consulted by the ED physician, recommending medical admission and bowel prep. GI was consulted to see the patient.  The patient remained hemodynamically stable.  The patient underwent repeat colonoscopy on 11/02/2019.  It was  noted the patient had a post polyp ectomy bleed likely culprit in the cecum of which the patient was treated with clipping secondary to presence of bleeding stigmata.  He also underwent clipping of the sigmoid polypectomy site.  Hemostasis was achieved.  The patient did not have any further hematochezia.  He remained hemodynamically stable.  He was stable for discharge home.  Discharge Diagnoses:  Post polypectomy bleeding - Presents with 9 episodes of BRBPR at home last night with ongoing bleeding in ED after colonoscopy with polypectomy on 10/30/19  - He reports lightheadedness but no LOC or chest pain  - GI is consulting and much appreciated, -Underwent repeat colonoscopy 11/02/2019 with clipping of the cecum as well as polypectomy site in the sigmoid colon -Remained hemodynamically stable -As the patient remained stable after his colonoscopy, he was cleared for discharge by GI.  CKD stage IIIa -Baseline creatinine 1.0-1.3  Essential hypertension -Telmisartan was withheld secondary to soft blood pressure -Patient was instructed to recheck his blood pressure at home and have his systolic blood pressures greater than 973 and diastolic greater than 80, he was instructed to restart his telmisartan  Chronic pancreatitis -Continue Creon  Discharge Instructions   Allergies as of 11/02/2019      Reactions   Diclofenac Hives   Norvasc [amlodipine Besylate] Other (See Comments)   Fluid buildup in chest   Omeprazole Other (See Comments)   MD stopped due to pancreatitis   Prednisone Other (See Comments)   Mood swings      Medication List    STOP taking these medications   ibuprofen 800 MG  tablet Commonly known as: ADVIL     TAKE these medications   acetaminophen 325 MG tablet Commonly known as: TYLENOL Take 1-2 tablets by mouth 3 (three) times daily as needed for moderate pain or headache.   diphenhydrAMINE 25 MG tablet Commonly known as: BENADRYL Take 25 mg by mouth daily as  needed for itching or allergies.   HYDROcodone-acetaminophen 5-325 MG tablet Commonly known as: Norco Take 1 tablet by mouth every 4 (four) hours as needed for severe pain.   lipase/protease/amylase 36000 UNITS Cpep capsule Commonly known as: Creon Take 1 capsule (36,000 Units total) by mouth See admin instructions. Take two capsules (36,000 units) by mouth with meals (3 meals daily) and one capsule (36000 units) with snacks daily What changed: how much to take   ondansetron 4 MG disintegrating tablet Commonly known as: Zofran ODT Take 1 tablet (4 mg total) by mouth every 8 (eight) hours as needed for nausea or vomiting.   promethazine 25 MG tablet Commonly known as: PHENERGAN Take 1 tablet (25 mg total) by mouth every 6 (six) hours as needed for nausea or vomiting.   telmisartan 40 MG tablet Commonly known as: MICARDIS Take 40 mg by mouth daily.   tetrahydrozoline 0.05 % ophthalmic solution Place 1 drop into both eyes daily as needed (Dry eyes).       Allergies  Allergen Reactions  . Diclofenac Hives  . Norvasc [Amlodipine Besylate] Other (See Comments)    Fluid buildup in chest  . Omeprazole Other (See Comments)    MD stopped due to pancreatitis  . Prednisone Other (See Comments)    Mood swings    Consultations:  [facility]   Procedures/Studies: CT ABDOMEN PELVIS W CONTRAST  Result Date: 10/14/2019 CLINICAL DATA:  Acute pancreatitis. EXAM: CT ABDOMEN AND PELVIS WITH CONTRAST TECHNIQUE: Multidetector CT imaging of the abdomen and pelvis was performed using the standard protocol following bolus administration of intravenous contrast. CONTRAST:  173mL OMNIPAQUE IOHEXOL 300 MG/ML  SOLN COMPARISON:  Aug 20, 2019. FINDINGS: Lower chest: No acute abnormality. Hepatobiliary: No focal liver abnormality is seen. Status post cholecystectomy. No biliary dilatation. Pancreas: Mild inflammatory changes are noted around the pancreatic body and tail suggesting acute pancreatitis. No  definite pseudocyst formation is seen at this time. No significant ductal dilatation is noted. Stable calcification is seen involving the uncinate process of the pancreatic head suggesting prior pancreatitis. Spleen: Normal in size without focal abnormality. Adrenals/Urinary Tract: Adrenal glands appear normal. 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 Stable nonobstructive calculi seen in lower pole collecting system of left kidney. No hydronephrosis or renal obstruction is noted. Urinary bladder is unremarkable. Stomach/Bowel: Stomach is within normal limits. Appendix appears normal. No evidence of bowel wall thickening, distention, or inflammatory changes. Vascular/Lymphatic: No significant vascular findings are present. No enlarged abdominal or pelvic lymph nodes. Reproductive: Prostate is unremarkable. Other: No abdominal wall hernia or abnormality. No abdominopelvic ascites. Musculoskeletal: No acute or significant osseous findings. IMPRESSION: 1. Mild inflammatory changes are noted around the pancreatic body and tail suggesting acute pancreatitis. No definite pseudocyst formation is noted at this time. 2. Stable calcification is seen involving the uncinate process of the pancreatic head suggesting prior pancreatitis. 3. Stable severe atrophy of right kidney is noted with associated cysts which is stable. 4. Stable nonobstructive left renal calculi. No hydronephrosis or renal obstruction is noted. Electronically Signed   By: Marijo Conception M.D.   On: 10/14/2019 13:48  DG Chest Portable 1 View  Result Date: 10/13/2019 CLINICAL DATA:  Chest pain and epigastric abdominal pain. Shortness of breath. EXAM: PORTABLE CHEST 1 VIEW COMPARISON:  09/13/2019 FINDINGS: The heart size and mediastinal contours are within normal limits. Both lungs are clear. The visualized skeletal structures are unremarkable. IMPRESSION: Normal exam. Electronically Signed   By: Lorriane Shire  M.D.   On: 10/13/2019 11:45         Discharge Exam: Vitals:   11/02/19 1255 11/02/19 1452  BP: 101/81 123/86  Pulse: 74 65  Resp: 11 16  Temp:  97.8 F (36.6 C)  SpO2: 100% 100%   Vitals:   11/02/19 1235 11/02/19 1245 11/02/19 1255 11/02/19 1452  BP: 103/71  101/81 123/86  Pulse: 72 67 74 65  Resp: 15 16 11 16   Temp:    97.8 F (36.6 C)  TempSrc:    Oral  SpO2: 100% 100% 100% 100%  Weight:      Height:        General: Pt is alert, awake, not in acute distress Cardiovascular: RRR, S1/S2 +, no rubs, no gallops Respiratory: CTA bilaterally, no wheezing, no rhonchi Abdominal: Soft, NT, ND, bowel sounds + Extremities: no edema, no cyanosis   The results of significant diagnostics from this hospitalization (including imaging, microbiology, ancillary and laboratory) are listed below for reference.    Significant Diagnostic Studies: CT ABDOMEN PELVIS W CONTRAST  Result Date: 10/14/2019 CLINICAL DATA:  Acute pancreatitis. EXAM: CT ABDOMEN AND PELVIS WITH CONTRAST TECHNIQUE: Multidetector CT imaging of the abdomen and pelvis was performed using the standard protocol following bolus administration of intravenous contrast. CONTRAST:  162mL OMNIPAQUE IOHEXOL 300 MG/ML  SOLN COMPARISON:  Aug 20, 2019. FINDINGS: Lower chest: No acute abnormality. Hepatobiliary: No focal liver abnormality is seen. Status post cholecystectomy. No biliary dilatation. Pancreas: Mild inflammatory changes are noted around the pancreatic body and tail suggesting acute pancreatitis. No definite pseudocyst formation is seen at this time. No significant ductal dilatation is noted. Stable calcification is seen involving the uncinate process of the pancreatic head suggesting prior pancreatitis. Spleen: Normal in size without focal abnormality. Adrenals/Urinary Tract: Adrenal glands appear normal. 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 Stable nonobstructive calculi seen in  lower pole collecting system of left kidney. No hydronephrosis or renal obstruction is noted. Urinary bladder is unremarkable. Stomach/Bowel: Stomach is within normal limits. Appendix appears normal. No evidence of bowel wall thickening, distention, or inflammatory changes. Vascular/Lymphatic: No significant vascular findings are present. No enlarged abdominal or pelvic lymph nodes. Reproductive: Prostate is unremarkable. Other: No abdominal wall hernia or abnormality. No abdominopelvic ascites. Musculoskeletal: No acute or significant osseous findings. IMPRESSION: 1. Mild inflammatory changes are noted around the pancreatic body and tail suggesting acute pancreatitis. No definite pseudocyst formation is noted at this time. 2. Stable calcification is seen involving the uncinate process of the pancreatic head suggesting prior pancreatitis. 3. Stable severe atrophy of right kidney is noted with associated cysts which is stable. 4. Stable nonobstructive left renal calculi. No hydronephrosis or renal obstruction is noted. Electronically Signed   By: Marijo Conception M.D.   On: 10/14/2019 13:48   DG Chest Portable 1 View  Result Date: 10/13/2019 CLINICAL DATA:  Chest pain and epigastric abdominal pain. Shortness of breath. EXAM: PORTABLE CHEST 1 VIEW COMPARISON:  09/13/2019 FINDINGS: The heart size and mediastinal contours are within normal limits. Both lungs are clear. The visualized skeletal structures are unremarkable. IMPRESSION:  Normal exam. Electronically Signed   By: Lorriane Shire M.D.   On: 10/13/2019 11:45     Microbiology: Recent Results (from the past 240 hour(s))  SARS Coronavirus 2 by RT PCR (hospital order, performed in [facility] hospital lab) Nasopharyngeal Nasopharyngeal Swab     Status: None   Collection Time: 11/02/19  3:02 AM   Specimen: Nasopharyngeal Swab  Result Value Ref Range Status   SARS Coronavirus 2 NEGATIVE NEGATIVE Final    Comment: (NOTE) SARS-CoV-2 target nucleic acids  are NOT DETECTED.  The SARS-CoV-2 RNA is generally detectable in upper and lower respiratory specimens during the acute phase of infection. The lowest concentration of SARS-CoV-2 viral copies this assay can detect is 250 copies / mL. A negative result does not preclude SARS-CoV-2 infection and should not be used as the sole basis for treatment or other patient management decisions.  A negative result may occur with improper specimen collection / handling, submission of specimen other than nasopharyngeal swab, presence of viral mutation(s) within the areas targeted by this assay, and inadequate number of viral copies (<250 copies / mL). A negative result must be combined with clinical observations, patient history, and epidemiological information.  Fact Sheet for Patients:   StrictlyIdeas.no  Fact Sheet for Healthcare Providers: BankingDealers.co.za  This test is not yet approved or  cleared by the [place] FDA and has been authorized for detection and/or diagnosis of SARS-CoV-2 by FDA under an Emergency Use Authorization (EUA).  This EUA will remain in effect (meaning this test can be used) for the duration of the COVID-19 declaration under Section 564(b)(1) of the Act, 21 U.S.C. section 360bbb-3(b)(1), unless the authorization is terminated or revoked sooner.  Performed at [facility], [place] [address], [place] [address], [place] [address]      Labs: Basic Metabolic Panel: Recent Labs  Lab 11/01/19 2355 11/02/19 0334  NA 137 139  K 3.9 4.2  CL 105 109  CO2 22 21*  GLUCOSE 105* 124*  BUN 12 13  CREATININE 1.44* 1.37*  CALCIUM 9.2 8.5*  MG  --  1.7   Liver Function Tests: Recent Labs  Lab 11/01/19 2355  AST 22  ALT 21  ALKPHOS 58  BILITOT 0.5  PROT 6.9  ALBUMIN 3.8   No results for input(s): LIPASE, AMYLASE in the last 168 hours. No results for input(s): AMMONIA in the last 168 hours. CBC: Recent Labs  Lab  11/01/19 2355 11/02/19 0334  WBC 6.4  --   HGB 12.8* 11.5*  HCT 38.6* 35.2*  MCV 97.5  --   PLT 243  --    Cardiac Enzymes: No results for input(s): CKTOTAL, CKMB, CKMBINDEX, TROPONINI in the last 168 hours. BNP: Invalid input(s): POCBNP CBG: No results for input(s): GLUCAP in the last 168 hours.  Time coordinating discharge:  36 minutes  Signed:  Orson Eva, DO [facility] Pager: [phone] 11/02/2019, 2:56 PM

## 2019-11-03 ENCOUNTER — Telehealth: Payer: Self-pay

## 2019-11-03 NOTE — Telephone Encounter (Addendum)
  Follow up Call-  Call back number 10/30/2019  Post procedure Call Back phone  # [phone]  Permission to leave phone message Yes  Some recent data might be hidden     Patient questions:  Do you have a fever, pain , or abdominal swelling? No. Pain Score  0 *  Have you tolerated food without any problems? Yes.    Have you been able to return to your normal activities? Yes.    Do you have any questions about your discharge instructions: Diet   No. Medications  No. Follow up visit  No.  Do you have questions or concerns about your Care? No.  Actions: * If pain score is 4 or above: No action needed, pain <4.  Pt reported he saw blood from his rectum Sunday and he went to the ER 22:15.  He said Dr. Henrene Pastor saw him and did another colonoscopy and stopped the bleeding.  He was discharged Monday to home.  No further bleeding noted per pt.  I advised him to please continue to monitor his toilet tissue and look in the toilet bowel after he has a BM. MAW   Per Dr. Silverio Decamp she was aware of this colonic bleed. maw

## 2019-11-04 ENCOUNTER — Encounter (HOSPITAL_COMMUNITY): Payer: Self-pay | Admitting: Internal Medicine

## 2019-11-04 NOTE — Telephone Encounter (Signed)
Thank you :)

## 2019-11-05 ENCOUNTER — Encounter (HOSPITAL_COMMUNITY): Payer: Self-pay | Admitting: Emergency Medicine

## 2019-11-05 ENCOUNTER — Observation Stay (HOSPITAL_COMMUNITY)
Admission: EM | Admit: 2019-11-05 | Discharge: 2019-11-06 | Disposition: A | Discharge status: Home / Self Care | Payer: Managed Care, Other (non HMO) | Attending: Internal Medicine | Admitting: Internal Medicine

## 2019-11-05 DIAGNOSIS — D649 Anemia, unspecified: Principal | ICD-10-CM

## 2019-11-05 DIAGNOSIS — M545 Low back pain: Secondary | ICD-10-CM | POA: Insufficient documentation

## 2019-11-05 DIAGNOSIS — Z20822 Contact with and (suspected) exposure to covid-19: Secondary | ICD-10-CM | POA: Diagnosis not present

## 2019-11-05 DIAGNOSIS — K861 Other chronic pancreatitis: Secondary | ICD-10-CM | POA: Diagnosis present

## 2019-11-05 DIAGNOSIS — Z8719 Personal history of other diseases of the digestive system: Secondary | ICD-10-CM | POA: Insufficient documentation

## 2019-11-05 DIAGNOSIS — I10 Essential (primary) hypertension: Secondary | ICD-10-CM | POA: Diagnosis not present

## 2019-11-05 DIAGNOSIS — R42 Dizziness and giddiness: Secondary | ICD-10-CM | POA: Diagnosis present

## 2019-11-05 LAB — CBC
HCT: 28.1 % — ABNORMAL LOW (ref 39.0–52.0)
Hemoglobin: 8.7 g/dL — ABNORMAL LOW (ref 13.0–17.0)
MCH: 31.9 pg (ref 26.0–34.0)
MCHC: 31 g/dL (ref 30.0–36.0)
MCV: 102.9 fL — ABNORMAL HIGH (ref 80.0–100.0)
Platelets: 214 10*3/uL (ref 150–400)
RBC: 2.73 MIL/uL — ABNORMAL LOW (ref 4.22–5.81)
RDW: 13.5 % (ref 11.5–15.5)
WBC: 7 10*3/uL (ref 4.0–10.5)
nRBC: 0 % (ref 0.0–0.2)

## 2019-11-05 LAB — BASIC METABOLIC PANEL
Anion gap: 8 (ref 5–15)
BUN: 7 mg/dL (ref 6–20)
CO2: 26 mmol/L (ref 22–32)
Calcium: 8.9 mg/dL (ref 8.9–10.3)
Chloride: 105 mmol/L (ref 98–111)
Creatinine, Ser: 1.17 mg/dL (ref 0.61–1.24)
GFR calc Af Amer: >60 mL/min (ref >60)
GFR calc non Af Amer: >60 mL/min (ref >60)
Glucose, Bld: 99 mg/dL (ref 70–99)
Potassium: 4.3 mmol/L (ref 3.5–5.1)
Sodium: 139 mmol/L (ref 135–145)

## 2019-11-05 LAB — SARS CORONAVIRUS 2 BY RT PCR (HOSPITAL ORDER, PERFORMED IN ~~LOC~~ HOSPITAL LAB): SARS Coronavirus 2: NEGATIVE

## 2019-11-05 LAB — LIPASE, BLOOD: Lipase: 30 U/L (ref 11–51)

## 2019-11-05 LAB — PREPARE RBC (CROSSMATCH)

## 2019-11-05 MED ORDER — PROMETHAZINE HCL 25 MG/ML IJ SOLN
25.0000 mg | Freq: Once | INTRAMUSCULAR | Status: AC
  Administered 2019-11-05: 25 mg via INTRAMUSCULAR
  Filled 2019-11-05: qty 1

## 2019-11-05 MED ORDER — SODIUM CHLORIDE 0.9% FLUSH
3.0000 mL | Freq: Once | INTRAVENOUS | Status: AC
  Administered 2019-11-05: 3 mL via INTRAVENOUS

## 2019-11-05 MED ORDER — HYDROCODONE-ACETAMINOPHEN 5-325 MG PO TABS
1.0000 | ORAL_TABLET | ORAL | Status: DC | PRN
  Administered 2019-11-05 – 2019-11-06 (×2): 1 via ORAL
  Filled 2019-11-05 (×2): qty 1

## 2019-11-05 MED ORDER — ONDANSETRON HCL 4 MG PO TABS: 4.0000 mg | ORAL_TABLET | Freq: Four times a day (QID) | ORAL | Status: DC | PRN

## 2019-11-05 MED ORDER — ACETAMINOPHEN 325 MG PO TABS: 650.0000 mg | ORAL_TABLET | Freq: Four times a day (QID) | ORAL | Status: DC | PRN

## 2019-11-05 MED ORDER — ACETAMINOPHEN 650 MG RE SUPP: 650.0000 mg | Freq: Four times a day (QID) | RECTAL | Status: DC | PRN

## 2019-11-05 MED ORDER — PANCRELIPASE (LIP-PROT-AMYL) 36000-114000 UNITS PO CPEP
72000.0000 [IU] | ORAL_CAPSULE | Freq: Three times a day (TID) | ORAL | Status: DC
  Administered 2019-11-06: 72000 [IU] via ORAL
  Filled 2019-11-05 (×5): qty 2

## 2019-11-05 MED ORDER — PANCRELIPASE (LIP-PROT-AMYL) 36000-114000 UNITS PO CPEP: 36000.0000 [IU] | ORAL_CAPSULE | ORAL | Status: DC

## 2019-11-05 MED ORDER — PROMETHAZINE HCL 25 MG/ML IJ SOLN
12.5000 mg | Freq: Four times a day (QID) | INTRAMUSCULAR | Status: DC | PRN
  Administered 2019-11-06 (×2): 12.5 mg via INTRAVENOUS
  Filled 2019-11-05 (×2): qty 1

## 2019-11-05 MED ORDER — PANCRELIPASE (LIP-PROT-AMYL) 36000-114000 UNITS PO CPEP
36000.0000 [IU] | ORAL_CAPSULE | ORAL | Status: DC
  Filled 2019-11-05 (×4): qty 1

## 2019-11-05 MED ORDER — SODIUM CHLORIDE 0.9% IV SOLUTION: Freq: Once | INTRAVENOUS | Status: AC

## 2019-11-05 MED ORDER — SODIUM CHLORIDE 0.9 % IV BOLUS
1000.0000 mL | Freq: Once | INTRAVENOUS | Status: AC
  Administered 2019-11-05: 1000 mL via INTRAVENOUS

## 2019-11-05 MED ORDER — HYDROMORPHONE HCL 1 MG/ML IJ SOLN: 0.5000 mg | INTRAMUSCULAR | Status: DC | PRN

## 2019-11-05 MED ORDER — ONDANSETRON HCL 4 MG/2ML IJ SOLN
4.0000 mg | Freq: Four times a day (QID) | INTRAMUSCULAR | Status: DC | PRN
  Administered 2019-11-06: 4 mg via INTRAVENOUS
  Filled 2019-11-05: qty 2

## 2019-11-05 NOTE — ED Provider Notes (Signed)
[facility] Provider Note   CSN: [id number] Arrival date & time: 11/05/19  1002     History Chief Complaint  Patient presents with  . Hypotension    Kyle Berry is a 49 y.o. male.  HPI   This patient is a 49 year old male, known history of chronic pancreatitis, history of chronic bronchitis and low back pain, he has a single kidney, acid reflux and unfortunately has suffered with kidney stones.  He was recently admitted to the hospital after having a colonoscopy with a polypectomy and had subsequent gastrointestinal bleeding.  Dr. Henrene Pastor did a repeat colonoscopy on 19 July, there was an area of "ulcerated mucosa was found in the cecum at the prior polypectomy site", no active bleeding, 4 hemostatic clips were successfully placed no bleeding at the end of the procedure.  The endoscopy that the patient had on July 16 showed that he had gastritis, biopsies were done, there was normal duodenum and normal esophagus.  The patient reports that over the last couple of days he has had lightheadedness and seeing spots when he stands up.  He gets better when he sits down, he was referred to his family doctor who saw him this morning and reports that he had some hypotension, they did an EKG, sent him to the emergency department because of the lightheadedness.  He has not had any further blood in his stools.  He did vomit once today and had some blood in his vomit.  He reports he has a history of chronic nausea and vomiting.  He has chronic upper abdominal pain, none of that is different than usual.  He is most concerned about his lightheadedness  Past Medical History:  Diagnosis Date  . Acute pancreatitis 11/23/2015  . Chronic bronchitis ([place])   . Chronic lower back pain   . Congenital single kidney   . GERD (gastroesophageal reflux disease)   . Headache    "once/month" (05/13/2017)  . High cholesterol   . History of kidney stones   . Hypertension   .  Migraine    "a couple/year" (05/13/2017)  . Nephrolithiasis 05/13/2017  . Pneumonia ~ 09/2015  . Recurrent acute pancreatitis   . Renal disorder     Patient Active Problem List   Diagnosis Date Noted  . Acute lower GI bleeding 11/02/2019  . Near syncope 11/02/2019  . AKI (acute kidney injury) ([place]) 11/02/2019  . Colonic ulcer   . Rectal bleeding 10/14/2019  . Pancreatic insufficiency 05/28/2019  . Recurrent acute pancreatitis 10/17/2018  . Microscopic hematuria 10/17/2018  . Acute pancreatitis 07/23/2018  . Pancreatic divisum   . Hematemesis 10/18/2017  . Recurrent pancreatitis 05/13/2017  . Congenital single kidney 05/13/2017  . High cholesterol 05/13/2017  . Nephrolithiasis 05/13/2017  . Acute on chronic pancreatitis ([place]) 01/17/2017  . Acute recurrent pancreatitis   . Hypokalemia   . Nausea and vomiting   . Essential hypertension   . Esophageal reflux   . Chronic pancreatitis ([place]) 11/23/2015    Past Surgical History:  Procedure Laterality Date  . CHOLECYSTECTOMY N/A 10/23/2017   Procedure: LAPAROSCOPIC CHOLECYSTECTOMY WITH INTRAOPERATIVE CHOLANGIOGRAM;  Surgeon: Johnathan Hausen, MD;  Location: [facility] ORS;  Service: General;  Laterality: N/A;  . COLONOSCOPY WITH PROPOFOL N/A 11/02/2019   Procedure: COLONOSCOPY WITH PROPOFOL;  Surgeon: Irene Shipper, MD;  Location: [facility] ENDOSCOPY;  Service: Endoscopy;  Laterality: N/A;  . HEMOSTASIS CLIP PLACEMENT  11/02/2019   Procedure: HEMOSTASIS CLIP PLACEMENT;  Surgeon: Irene Shipper,  MD;  Location: [facility] ENDOSCOPY;  Service: Endoscopy;;  . NO PAST SURGERIES         Family History  Problem Relation Age of Onset  . Hypertension Other   . Hypertension Mother   . Hypertension Father   . Kidney disease Father   . Hypertension Sister   . Diabetes Sister   . Hypertension Brother     Social History   Tobacco Use  . Smoking status: Never Smoker  . Smokeless tobacco: Never Used  Vaping Use  . Vaping Use: Never used  Substance Use  Topics  . Alcohol use: Not Currently    Comment: 05/13/2017 "might have beer a few times/year"  . Drug use: No    Home Medications Prior to Admission medications   Medication Sig Start Date End Date Taking? Authorizing Provider  acetaminophen (TYLENOL) 325 MG tablet Take 1-2 tablets by mouth 3 (three) times daily as needed for moderate pain or headache. 08/19/19   [provider]  diphenhydrAMINE (BENADRYL) 25 MG tablet Take 25 mg by mouth daily as needed for itching or allergies.    [provider]  HYDROcodone-acetaminophen (NORCO) 5-325 MG tablet Take 1 tablet by mouth every 4 (four) hours as needed for severe pain. 10/13/19   Sherwood Gambler, MD  lipase/protease/amylase (CREON) 36000 UNITS CPEP capsule Take 1 capsule (36,000 Units total) by mouth See admin instructions. Take two capsules (36,000 units) by mouth with meals (3 meals daily) and one capsule (36000 units) with snacks daily Patient taking differently: Take 36,000-72,000 Units by mouth See admin instructions. Take two capsules (36,000 units) by mouth with meals (3 meals daily) and one capsule (36000 units) with snacks daily 09/07/19   Nandigam, Venia Minks, MD  ondansetron (ZOFRAN ODT) 4 MG disintegrating tablet Take 1 tablet (4 mg total) by mouth every 8 (eight) hours as needed for nausea or vomiting. 03/12/19   Fransico Meadow, PA-C  promethazine (PHENERGAN) 25 MG tablet Take 1 tablet (25 mg total) by mouth every 6 (six) hours as needed for nausea or vomiting. 10/13/19   Sherwood Gambler, MD  telmisartan (MICARDIS) 40 MG tablet Take 40 mg by mouth daily. 03/05/19   [provider]  tetrahydrozoline 0.05 % ophthalmic solution Place 1 drop into both eyes daily as needed (Dry eyes).    [provider]    Allergies    Diclofenac, Norvasc [amlodipine besylate], Omeprazole, and Prednisone  Review of Systems   Review of Systems  All other systems reviewed and are negative.   Physical Exam Updated Vital  Signs BP (!) 126/105   Pulse 71   Temp 98.4 F (36.9 C)   Resp 16   SpO2 100%   Physical Exam Vitals and nursing note reviewed.  Constitutional:      General: He is not in acute distress.    Appearance: He is well-developed.  HENT:     Head: Normocephalic and atraumatic.     Mouth/Throat:     Pharynx: No oropharyngeal exudate.  Eyes:     General: No scleral icterus.       Right eye: No discharge.        Left eye: No discharge.     Conjunctiva/sclera: Conjunctivae normal.     Pupils: Pupils are equal, round, and reactive to light.  Neck:     Thyroid: No thyromegaly.     Vascular: No JVD.  Cardiovascular:     Rate and Rhythm: Normal rate and regular rhythm.     Heart  sounds: Normal heart sounds. No murmur heard.  No friction rub. No gallop.   Pulmonary:     Effort: Pulmonary effort is normal. No respiratory distress.     Breath sounds: Normal breath sounds. No wheezing or rales.  Abdominal:     General: Bowel sounds are normal. There is no distension.     Palpations: Abdomen is soft. There is no mass.     Tenderness: There is abdominal tenderness.     Comments: Minimal epigastric and left upper quadrant tenderness, no guarding or peritoneal signs  Musculoskeletal:        General: No tenderness. Normal range of motion.     Cervical back: Normal range of motion and neck supple.  Lymphadenopathy:     Cervical: No cervical adenopathy.  Skin:    General: Skin is warm and dry.     Findings: No erythema or rash.  Neurological:     Mental Status: He is alert.     Coordination: Coordination normal.  Psychiatric:        Behavior: Behavior normal.     ED Results / Procedures / Treatments   Labs (all labs ordered are listed, but only abnormal results are displayed) Labs Reviewed  CBC - Abnormal; Notable for the following components:      Result Value   RBC 2.73 (*)    Hemoglobin 8.7 (*)    HCT 28.1 (*)    MCV 102.9 (*)    All other components within normal limits    BASIC METABOLIC PANEL  CBG MONITORING, ED  TYPE AND SCREEN    EKG EKG Interpretation  Date/Time:  Thursday November 05 2019 10:22:21 EDT Ventricular Rate:  83 PR Interval:  150 QRS Duration: 76 QT Interval:  350 QTC Calculation: 411 R Axis:   33 Text Interpretation: Normal sinus rhythm Normal ECG since last tracing no significant change Confirmed by Noemi Chapel [phone]) on 11/05/2019 3:29:56 PM   Radiology No results found.  Procedures Procedures (including critical care time)  Medications Ordered in ED Medications  sodium chloride flush (NS) 0.9 % injection 3 mL (3 mLs Intravenous Given 11/05/19 1741)  sodium chloride 0.9 % bolus 1,000 mL (1,000 mLs Intravenous New Bag/Given 11/05/19 1721)  promethazine (PHENERGAN) injection 25 mg (25 mg Intramuscular Given 11/05/19 1738)    ED Course  I have reviewed the triage vital signs and the nursing notes.  Pertinent labs & imaging results that were available during my care of the patient were reviewed by me and considered in my medical decision making (see chart for details).  Clinical Course as of Nov 04 1857  Thu Nov 05, 2019  1710 Pt labs show Hgb of 8.7, down 3 gram in several days - may have been from prior bleeding during last admission - needs consultation with GI - possible recheck of Hgb, BP remains normal when supine    [BM]  1710 Requesting nausea meds - promethazine given   [BM]    Clinical Course User Index [BM] Noemi Chapel, MD   MDM Rules/Calculators/A&P                          This patient has 2 recent colonoscopies, clips were placed during the second colonoscopy and bleeding stopped.  I suspect that the patient had bleeding which is left him with some anemia to some degree but at this time has totally normal vital signs at least in the supine position.  His clinical symptoms  are consistent with orthostatic hypotension, this will be examined with nurse orthostatic vital signs, he will get 1 L of IV fluids and a  repeat hemoglobin to see if he has had a significant change.  EKG will also be performed.  His EKG is normal, I see no signs of arrhythmia or ischemia  Discussed with the gastroenterology team, they will see in consultation and request that the patient be admitted to a medical bed for observation and hemoglobin trending throughout the evening.    Final Clinical Impression(s) / ED Diagnoses Final diagnoses:  Symptomatic anemia  H/O: GI bleed    Rx / DC Orders ED Discharge Orders    None       Noemi Chapel, MD 11/05/19 1859

## 2019-11-05 NOTE — ED Triage Notes (Signed)
Pt states he had a colonoscopy on Friday, began bleeding on Monday and had to come here to be treated, did not get blood transfusion, states he has been feeling weak and dizzy since leaving the hospital, saw his doctor today who checked his bp and stated he needed to come in due to it being low (90/60). Pt a/ox4, resp e/u, nad.

## 2019-11-05 NOTE — H&P (Signed)
History and Physical    Kyle Berry TJQ:[id number] DOB: 02/20/1971 DOA: 11/05/2019  PCP: [name] (Inactive)  Patient coming from: 96 office  I have personally briefly reviewed patient's old medical records in [place]  Chief Complaint: Hypotension, lightheadedness, blood in vomit  HPI: Kyle Berry is a 49 y.o. male with medical history significant for chronic pancreatitis and hypertension who presents to the ED for evaluation of symptomatic anemia.  Patient with 2 recent hospitalizations: Initially hospitalized 10/13/2019-10/16/2019 for acute on chronic pancreatitis.  He had episodes of hematemesis and rectal bleeding with stable hemoglobin.  He underwent upper endoscopy and colonoscopy on 10/30/2019 by Dr. Silverio Decamp with [facility] on an outpatient basis.  Upper endoscopy was notable for gastritis.  Colonoscopy showed several polyps which were resected.  Clip was placed at site of polyp at the rectosigmoid colon.  Diverticulosis of the sigmoid colon, ascending colon and in the cecum noted as well as nonbleeding internal hemorrhoids.  Patient was hospitalized again from 11/01/2019-11/02/2019 for lower GI bleed with BRBPR.  He underwent repeat colonoscopy on 11/02/2019 by Dr. Henrene Pastor.  He was felt to have post polypectomy bleeding and underwent clipping at sites in the cecum and sigmoid colon for hemostasis.  He was discharged home with stable hemoglobin 11.5.  Patient states that since he left the hospital he has been having lightheadedness/dizziness with activity.  He has had some diaphoresis at home.  He has been checking his blood pressure has noted lower readings than expected with low with systolic in the 76A.  He has been having intermittent nausea and vomiting.  He was seen in follow-up by his PCP earlier today (11/05/2019).  He was noted to be hypotensive and was sent to the ED for further evaluation due to concern for symptomatic anemia.  Patient  states he did have an episode of emesis with bright red blood on arrival to the hospital.  He had 2 further episodes of emesis containing only scant amount of bright red blood.  He has not seen any bloody stools or dark black appearing stools. He denies any other obvious bleeding.  He does not take any blood thinners or NSAIDs.    ED Course:  Initial vitals showed BP 119/91, pulse 84, RR 12, temp 98.4 Fahrenheit, SPO2 100% on room air.  Orthostatic vital signs were negative.  Labs notable for hemoglobin 8.7 (11.5 on 11/02/2019), WBC 7.0, platelets 214,000, sodium 139, potassium 4.3, bicarb 26, BUN 7, creatinine 1.17, EGFR >60.  EDP discussed the case with on-call GI who recommended medical admission and will see in consultation.  Patient was given 1 L normal saline and IM Phenergan.  The hospitalist service was consulted to admit for further evaluation and management.  Review of Systems: All systems reviewed and are negative except as documented in history of present illness above.   Past Medical History:  Diagnosis Date  . Acute pancreatitis 11/23/2015  . Chronic bronchitis ([place])   . Chronic lower back pain   . Congenital single kidney   . GERD (gastroesophageal reflux disease)   . Headache    "once/month" (05/13/2017)  . High cholesterol   . History of kidney stones   . Hypertension   . Migraine    "a couple/year" (05/13/2017)  . Nephrolithiasis 05/13/2017  . Pneumonia ~ 09/2015  . Recurrent acute pancreatitis   . Renal disorder     Past Surgical History:  Procedure Laterality Date  . CHOLECYSTECTOMY N/A 10/23/2017   Procedure: LAPAROSCOPIC CHOLECYSTECTOMY  WITH INTRAOPERATIVE CHOLANGIOGRAM;  Surgeon: Johnathan Hausen, MD;  Location: [facility] ORS;  Service: General;  Laterality: N/A;  . COLONOSCOPY WITH PROPOFOL N/A 11/02/2019   Procedure: COLONOSCOPY WITH PROPOFOL;  Surgeon: Irene Shipper, MD;  Location: [facility] ENDOSCOPY;  Service: Endoscopy;  Laterality: N/A;  . HEMOSTASIS CLIP PLACEMENT   11/02/2019   Procedure: HEMOSTASIS CLIP PLACEMENT;  Surgeon: Irene Shipper, MD;  Location: [facility] ENDOSCOPY;  Service: Endoscopy;;  . NO PAST SURGERIES      Social History:  reports that he has never smoked. He has never used smokeless tobacco. He reports previous alcohol use. He reports that he does not use drugs.  Allergies  Allergen Reactions  . Diclofenac Hives  . Norvasc [Amlodipine Besylate] Other (See Comments)    Fluid buildup in chest  . Omeprazole Other (See Comments)    MD stopped due to pancreatitis  . Prednisone Other (See Comments)    Mood swings    Family History  Problem Relation Age of Onset  . Hypertension Other   . Hypertension Mother   . Hypertension Father   . Kidney disease Father   . Hypertension Sister   . Diabetes Sister   . Hypertension Brother      Prior to Admission medications   Medication Sig Start Date End Date Taking? Authorizing Provider  acetaminophen (TYLENOL) 325 MG tablet Take 1-2 tablets by mouth 3 (three) times daily as needed for moderate pain or headache. 08/19/19   [provider]  diphenhydrAMINE (BENADRYL) 25 MG tablet Take 25 mg by mouth daily as needed for itching or allergies.    [provider]  HYDROcodone-acetaminophen (NORCO) 5-325 MG tablet Take 1 tablet by mouth every 4 (four) hours as needed for severe pain. 10/13/19   Sherwood Gambler, MD  lipase/protease/amylase (CREON) 36000 UNITS CPEP capsule Take 1 capsule (36,000 Units total) by mouth See admin instructions. Take two capsules (36,000 units) by mouth with meals (3 meals daily) and one capsule (36000 units) with snacks daily Patient taking differently: Take 36,000-72,000 Units by mouth See admin instructions. Take two capsules (36,000 units) by mouth with meals (3 meals daily) and one capsule (36000 units) with snacks daily 09/07/19   Nandigam, Venia Minks, MD  ondansetron (ZOFRAN ODT) 4 MG disintegrating tablet Take 1 tablet (4 mg total) by mouth every 8 (eight)  hours as needed for nausea or vomiting. 03/12/19   Fransico Meadow, PA-C  promethazine (PHENERGAN) 25 MG tablet Take 1 tablet (25 mg total) by mouth every 6 (six) hours as needed for nausea or vomiting. 10/13/19   Sherwood Gambler, MD  telmisartan (MICARDIS) 40 MG tablet Take 40 mg by mouth daily. 03/05/19   [provider]  tetrahydrozoline 0.05 % ophthalmic solution Place 1 drop into both eyes daily as needed (Dry eyes).    [provider]    Physical Exam: Vitals:   11/05/19 1441 11/05/19 1719 11/05/19 1730 11/05/19 1815  BP: (!) 130/95 (!) 133/113 (!) 135/87 (!) 126/105  Pulse: 70 77 61 71  Resp: 12  16 16   Temp:      SpO2: 100% 100% 100% 100%   Constitutional: Resting supine in bed, NAD, calm, comfortable Eyes: PERRL, lids and conjunctivae normal ENMT: Mucous membranes are moist. Posterior pharynx clear of any exudate or lesions.Normal dentition.  Neck: normal, supple, no masses. Respiratory: clear to auscultation bilaterally, no wheezing, no crackles. Normal respiratory effort. No accessory muscle use.  Cardiovascular: Regular rate and rhythm, no murmurs / rubs / gallops.  No extremity edema. 2+ pedal pulses. Abdomen: Epigastric tenderness, no masses palpated. No hepatosplenomegaly. Bowel sounds positive.  Musculoskeletal: no clubbing / cyanosis. No joint deformity upper and lower extremities. Good ROM, no contractures. Normal muscle tone.  Skin: no rashes, lesions, ulcers. No induration Neurologic: CN 2-12 grossly intact. Sensation intact, Strength 5/5 in all 4.  Psychiatric: Normal judgment and insight. Alert and oriented x 3. Normal mood.     Labs on Admission: I have personally reviewed following labs and imaging studies  CBC: Recent Labs  Lab 11/01/19 2355 11/02/19 0334 11/05/19 1555  WBC 6.4  --  7.0  HGB 12.8* 11.5* 8.7*  HCT 38.6* 35.2* 28.1*  MCV 97.5  --  102.9*  PLT 243  --  654   Basic Metabolic Panel: Recent Labs  Lab 11/01/19 2355  11/02/19 0334 11/05/19 1555  NA 137 139 139  K 3.9 4.2 4.3  CL 105 109 105  CO2 22 21* 26  GLUCOSE 105* 124* 99  BUN 12 13 7   CREATININE 1.44* 1.37* 1.17  CALCIUM 9.2 8.5* 8.9  MG  --  1.7  --    GFR: Estimated Creatinine Clearance: 81.4 mL/min (by C-G formula based on SCr of 1.17 mg/dL). Liver Function Tests: Recent Labs  Lab 11/01/19 2355  AST 22  ALT 21  ALKPHOS 58  BILITOT 0.5  PROT 6.9  ALBUMIN 3.8   No results for input(s): LIPASE, AMYLASE in the last 168 hours. No results for input(s): AMMONIA in the last 168 hours. Coagulation Profile: Recent Labs  Lab 11/02/19 0233  INR 1.1   Cardiac Enzymes: No results for input(s): CKTOTAL, CKMB, CKMBINDEX, TROPONINI in the last 168 hours. BNP (last 3 results) No results for input(s): PROBNP in the last 8760 hours. HbA1C: No results for input(s): HGBA1C in the last 72 hours. CBG: No results for input(s): GLUCAP in the last 168 hours. Lipid Profile: No results for input(s): CHOL, HDL, LDLCALC, TRIG, CHOLHDL, LDLDIRECT in the last 72 hours. Thyroid Function Tests: No results for input(s): TSH, T4TOTAL, FREET4, T3FREE, THYROIDAB in the last 72 hours. Anemia Panel: No results for input(s): VITAMINB12, FOLATE, FERRITIN, TIBC, IRON, RETICCTPCT in the last 72 hours. Urine analysis:    Component Value Date/Time   COLORURINE YELLOW 10/13/2019 0613   APPEARANCEUR CLEAR 10/13/2019 0613   LABSPEC 1.026 10/13/2019 0613   PHURINE 5.0 10/13/2019 0613   GLUCOSEU NEGATIVE 10/13/2019 0613   HGBUR NEGATIVE 10/13/2019 0613   BILIRUBINUR NEGATIVE 10/13/2019 0613   KETONESUR NEGATIVE 10/13/2019 0613   PROTEINUR NEGATIVE 10/13/2019 0613   UROBILINOGEN 0.2 01/20/2015 1643   NITRITE NEGATIVE 10/13/2019 0613   LEUKOCYTESUR NEGATIVE 10/13/2019 6503    Radiological Exams on Admission: No results found.  EKG: Independently reviewed. Normal sinus rhythm without acute ischemic changes.  Not significantly changed when compared to  prior.  Assessment/Plan Principal Problem:   Symptomatic anemia Active Problems:   Chronic pancreatitis ([place])   Essential hypertension  Kyle Berry is a 49 y.o. male with medical history significant for chronic pancreatitis and hypertension who is admitted with symptomatic anemia.  Symptomatic anemia with recent post polypectomy GI bleeding: With associated drop in hemoglobin from 11.5 on 11/02/2019 to 8.7 at time of admission.  He has had nausea with vomiting with some blood content in his emesis.  He denies any obvious hematochezia or melena.  Will transfuse 1 unit PRBC given her ongoing symptoms. -Transfuse 1 unit PRBC -Repeat CBC in a.m. -GI to see in consultation, appreciate further  assistance -Keep n.p.o. at midnight  Chronic pancreatitis: Is having acute pain/flareup.  Will check lipase.  Continue analgesics as needed and home Creon.  Hypertension: Hold home telmisartan due to reported hypotensive episodes.  DVT prophylaxis: SCDs Code Status: Full code, confirmed with patient Family Communication: Discussed with patient, he has discussed with family Disposition Plan: From home and likely discharge home pending symptomatic improvement and GI evaluation. Consults called: GI Admission status:  Status is: Observation  The patient remains OBS appropriate and will d/c before 2 midnights.  Dispo: The patient is from: Home              Anticipated d/c is to: Home              Anticipated d/c date is: 1 day pending symptomatic improvement in GI evaluation.              Patient currently is not medically stable to d/c.  Zada Finders MD [facility]  If 7PM-7AM, please contact night-coverage [email or web address]  11/05/2019, 7:26 PM

## 2019-11-06 ENCOUNTER — Telehealth: Payer: Self-pay

## 2019-11-06 ENCOUNTER — Observation Stay (HOSPITAL_COMMUNITY): Payer: Managed Care, Other (non HMO) | Admitting: Certified Registered"

## 2019-11-06 ENCOUNTER — Encounter (HOSPITAL_COMMUNITY): Payer: Self-pay | Admitting: Internal Medicine

## 2019-11-06 ENCOUNTER — Encounter (HOSPITAL_COMMUNITY)
Admission: EM | Disposition: A | Discharge status: Home / Self Care | Payer: Self-pay | Source: Home / Self Care | Attending: Emergency Medicine

## 2019-11-06 ENCOUNTER — Encounter: Payer: Self-pay | Admitting: Gastroenterology

## 2019-11-06 ENCOUNTER — Other Ambulatory Visit: Payer: Self-pay

## 2019-11-06 DIAGNOSIS — D649 Anemia, unspecified: Secondary | ICD-10-CM | POA: Diagnosis not present

## 2019-11-06 DIAGNOSIS — K922 Gastrointestinal hemorrhage, unspecified: Secondary | ICD-10-CM

## 2019-11-06 DIAGNOSIS — I10 Essential (primary) hypertension: Secondary | ICD-10-CM

## 2019-11-06 DIAGNOSIS — R109 Unspecified abdominal pain: Secondary | ICD-10-CM

## 2019-11-06 DIAGNOSIS — K920 Hematemesis: Secondary | ICD-10-CM | POA: Diagnosis not present

## 2019-11-06 DIAGNOSIS — K861 Other chronic pancreatitis: Secondary | ICD-10-CM

## 2019-11-06 HISTORY — PX: ESOPHAGOGASTRODUODENOSCOPY (EGD) WITH PROPOFOL: SHX5813

## 2019-11-06 LAB — CBC
HCT: 27.7 % — ABNORMAL LOW (ref 39.0–52.0)
Hemoglobin: 8.7 g/dL — ABNORMAL LOW (ref 13.0–17.0)
MCH: 31.5 pg (ref 26.0–34.0)
MCHC: 31.4 g/dL (ref 30.0–36.0)
MCV: 100.4 fL — ABNORMAL HIGH (ref 80.0–100.0)
Platelets: 199 10*3/uL (ref 150–400)
RBC: 2.76 MIL/uL — ABNORMAL LOW (ref 4.22–5.81)
RDW: 15.1 % (ref 11.5–15.5)
WBC: 6.6 10*3/uL (ref 4.0–10.5)
nRBC: 0 % (ref 0.0–0.2)

## 2019-11-06 LAB — COMPREHENSIVE METABOLIC PANEL
ALT: 28 U/L (ref 0–44)
AST: 34 U/L (ref 15–41)
Albumin: 3.3 g/dL — ABNORMAL LOW (ref 3.5–5.0)
Alkaline Phosphatase: 35 U/L — ABNORMAL LOW (ref 38–126)
Anion gap: 9 (ref 5–15)
BUN: 5 mg/dL — ABNORMAL LOW (ref 6–20)
CO2: 24 mmol/L (ref 22–32)
Calcium: 8.4 mg/dL — ABNORMAL LOW (ref 8.9–10.3)
Chloride: 107 mmol/L (ref 98–111)
Creatinine, Ser: 1.15 mg/dL (ref 0.61–1.24)
GFR calc Af Amer: >60 mL/min (ref >60)
GFR calc non Af Amer: >60 mL/min (ref >60)
Glucose, Bld: 93 mg/dL (ref 70–99)
Potassium: 3.6 mmol/L (ref 3.5–5.1)
Sodium: 140 mmol/L (ref 135–145)
Total Bilirubin: 0.7 mg/dL (ref 0.3–1.2)
Total Protein: 5.8 g/dL — ABNORMAL LOW (ref 6.5–8.1)

## 2019-11-06 LAB — TYPE AND SCREEN
ABO/RH(D): O POS
Antibody Screen: NEGATIVE
Unit division: 0

## 2019-11-06 LAB — BPAM RBC
Blood Product Expiration Date: 202108242359
ISSUE DATE / TIME: 202107222101
Unit Type and Rh: 5100

## 2019-11-06 LAB — HEMOGLOBIN AND HEMATOCRIT, BLOOD
HCT: 26.4 % — ABNORMAL LOW (ref 39.0–52.0)
Hemoglobin: 8.4 g/dL — ABNORMAL LOW (ref 13.0–17.0)

## 2019-11-06 SURGERY — ESOPHAGOGASTRODUODENOSCOPY (EGD) WITH PROPOFOL
Anesthesia: Monitor Anesthesia Care

## 2019-11-06 MED ORDER — ONDANSETRON 4 MG PO TBDP
4.0000 mg | ORAL_TABLET | Freq: Three times a day (TID) | ORAL | 0 refills | Status: DC | PRN
Start: 2019-11-06 — End: 2020-01-29

## 2019-11-06 MED ORDER — PROPOFOL 10 MG/ML IV BOLUS
INTRAVENOUS | Status: DC | PRN
  Administered 2019-11-06 (×2): 50 mg via INTRAVENOUS
  Administered 2019-11-06: 30 mg via INTRAVENOUS

## 2019-11-06 MED ORDER — PROPOFOL 500 MG/50ML IV EMUL
INTRAVENOUS | Status: DC | PRN
  Administered 2019-11-06: 125 ug/kg/min via INTRAVENOUS

## 2019-11-06 MED ORDER — LIDOCAINE 2% (20 MG/ML) 5 ML SYRINGE
INTRAMUSCULAR | Status: DC | PRN
  Administered 2019-11-06: 100 mg via INTRAVENOUS

## 2019-11-06 MED ORDER — LACTATED RINGERS IV SOLN
INTRAVENOUS | Status: DC | PRN
Start: 2019-11-06 — End: 2019-11-06

## 2019-11-06 SURGICAL SUPPLY — 15 items

## 2019-11-06 NOTE — ED Notes (Signed)
MS Breakfast Ordered 

## 2019-11-06 NOTE — Telephone Encounter (Signed)
-----   Message from Mauri Pole, MD sent at 11/02/2019  1:29 PM EDT ----- Jamas Lav,  Can you please call him later this week to check if he is doing ok, he will need H&H check on next Monday. Than you VN ----- Message ----- From: Irene Shipper, MD Sent: 11/02/2019  12:36 PM EDT To: Mauri Pole, MD  It went well.  I suspect the culprit lesion was cecal.  I clipped both the cecal lesion and the sigmoid lesion (previous clips had fallen off).  No active bleeding at the time of the procedure.  May let him go home later this afternoon if doing well.  Have your nurse give him a follow-up call at some point this week.  Thanks ----- Message ----- From: Mauri Pole, MD Sent: 11/02/2019  12:07 PM EDT To: Irene Shipper, MD  Thank you Jenny Reichmann for letting me know ----- Message ----- From: Irene Shipper, MD Sent: 11/02/2019  11:46 AM EDT To: Mauri Pole, MD  Viviana Simpler only.  He is stable.  I am about to scope him. John ----- Message ----- From: Ladene Artist, MD Sent: 11/02/2019   7:20 AM EDT To: Alfredia Ferguson, PA-C, Irene Shipper, MD, #  Consult [facility] for hematochezia, hemodynamically stable. Colonoscopy 7/16 by VN with cecal polyp hot snare, rectosigmoid polyp hot snare and clipped. Suspected post polypectomy bleed. Asked EDP to start [place] for possible colonoscopy today.

## 2019-11-06 NOTE — Transfer of Care (Signed)
Immediate Anesthesia Transfer of Care Note  Patient: Kyle Berry  Procedure(s) Performed: ESOPHAGOGASTRODUODENOSCOPY (EGD) WITH PROPOFOL (N/A )  Patient Location: Endoscopy Unit  Anesthesia Type:MAC  Level of Consciousness: drowsy and patient cooperative  Airway & Oxygen Therapy: Patient Spontanous Breathing  Post-op Assessment: Report given to RN and Post -op Vital signs reviewed and stable  Post vital signs: Reviewed and stable  Last Vitals:  Vitals Value Taken Time  BP    Temp    Pulse    Resp    SpO2      Last Pain:  Vitals:   11/06/19 1022  TempSrc: Temporal  PainSc: 0-No pain         Complications: No complications documented.

## 2019-11-06 NOTE — Discharge Summary (Signed)
PATIENT DETAILS Name: Kyle Berry Age: 49 y.o. Sex: male Date of Birth: 10-08-1970 MRN: [id number]. Admitting Physician: Lenore Cordia, MD [id number], [name] (Inactive)  Admit Date: 11/05/2019 Discharge date: 11/06/2019  Recommendations for Outpatient Follow-up:  1. Follow up with PCP in 1-2 weeks 2. Please obtain CMP/CBC in one week  Admitted From:  Home  Disposition: [place]: No  Equipment/Devices: None  Discharge Condition: Stable  CODE STATUS: FULL CODE  Diet recommendation:  Diet Order            Diet Heart Room service appropriate? Yes; Fluid consistency: Thin  Diet effective now           Diet - low sodium heart healthy                  Brief Summary: See H&P, Labs, Consult and Test reports for all details in brief, patient is a 49 year old male with history of chronic pancreatitis, HTN-who presented with lightheadedness-possible hematemesis-subsequently admitted to the hospitalist service.  Chart review of recent hospitalization/procedures 1.Recently hospitalized from 6/29-7/2 for acute on chronic pancreatitis 2.Subsequently underwent upper endoscopy/colonoscopy on 7/16 3.Hospitalized from 7/18-7/19 for lower GI bleeding-s/p colonoscopy-thought to have post polypectomy bleeding and underwent clipping at sites in the cecum and sigmoid colon for hemostasis.   Brief Hospital Course: Hematemesis: S/p EGD earlier today-no bleeding foci seen.  GI recommends supportive care and discharge home.  Has tolerated advancement in diet post EGD-abdominal exam is benign.  Anemia: Felt to be secondary to re-equilibration from recent post polypectomy bleeding rather than active bleeding.  His stools are brown in color-therefore unlikely to have lower GI bleeding from recent polypectomy.  EGD today negative for bleeding source.  Was given 1 unit of PRBC-hemoglobin currently stable.  Repeat CBC in 1 week at PCPs office  Chronic pancreatitis:  Abdomen soft-continue home medications.  Procedures 7/23 >>EGD: No bleeding evident  Discharge Diagnoses:  Principal Problem:   Symptomatic anemia Active Problems:   Chronic pancreatitis [facility])   Essential hypertension   Discharge Instructions:  Activity:  As tolerated   Discharge Instructions    Call MD for:   Complete by: As directed    Recurrent rectal bleeding or blood in vomitus   Call MD for:  persistant nausea and vomiting   Complete by: As directed    Call MD for:  severe uncontrolled pain   Complete by: As directed    Diet - low sodium heart healthy   Complete by: As directed    Low-fat diet   Discharge instructions   Complete by: As directed    Follow with Primary MD  [name], [name] (Inactive) in 1-2 weeks  Please get a complete blood count and chemistry panel checked by your Primary MD at your next visit, and again as instructed by your Primary MD.  Get Medicines reviewed and adjusted: Please take all your medications with you for your next visit with your Primary MD  Laboratory/radiological data: Please request your Primary MD to go over all hospital tests and procedure/radiological results at the follow up, please ask your Primary MD to get all Hospital records sent to his/her office.  In some cases, they will be blood work, cultures and biopsy results pending at the time of your discharge. Please request that your primary care M.D. follows up on these results.  Also Note the following: If you experience worsening of your admission symptoms, develop shortness of breath, life threatening emergency, suicidal or homicidal thoughts  you must seek medical attention immediately by calling 911 or calling your MD immediately  if symptoms less severe.  You must read complete instructions/literature along with all the possible adverse reactions/side effects for all the Medicines you take and that have been prescribed to you. Take any new Medicines after you have  completely understood and accpet all the possible adverse reactions/side effects.   Do not drive when taking Pain medications or sleeping medications (Benzodaizepines)  Do not take more than prescribed Pain, Sleep and Anxiety Medications. It is not advisable to combine anxiety,sleep and pain medications without talking with your primary care practitioner  Special Instructions: If you have smoked or chewed Tobacco  in the last 2 yrs please stop smoking, stop any regular Alcohol  and or any Recreational drug use.  Wear Seat belts while driving.  Please note: You were cared for by a hospitalist during your hospital stay. Once you are discharged, your primary care physician will handle any further medical issues. Please note that NO REFILLS for any discharge medications will be authorized once you are discharged, as it is imperative that you return to your primary care physician (or establish a relationship with a primary care physician if you do not have one) for your post hospital discharge needs so that they can reassess your need for medications and monitor your lab values.   Increase activity slowly   Complete by: As directed      Allergies as of 11/06/2019      Reactions   Diclofenac Hives   Norvasc [amlodipine Besylate] Other (See Comments)   Fluid buildup in chest   Omeprazole Other (See Comments)   MD stopped due to pancreatitis   Prednisone Other (See Comments)   Mood swings      Medication List    STOP taking these medications   HYDROcodone-acetaminophen 5-325 MG tablet Commonly known as: Norco   promethazine 25 MG tablet Commonly known as: PHENERGAN   tetrahydrozoline 0.05 % ophthalmic solution     TAKE these medications   lipase/protease/amylase 36000 UNITS Cpep capsule Commonly known as: Creon Take 1 capsule (36,000 Units total) by mouth See admin instructions. Take two capsules (36,000 units) by mouth with meals (3 meals daily) and one capsule (36000 units) with  snacks daily What changed: how much to take   ondansetron 4 MG disintegrating tablet Commonly known as: Zofran ODT Take 1 tablet (4 mg total) by mouth every 8 (eight) hours as needed for nausea or vomiting.       Follow-up Information    [name], [place]. Schedule an appointment as soon as possible for a visit in 1 week(s).   Contact information: [facility] [address] [place] [address] [phone]              Allergies  Allergen Reactions  . Diclofenac Hives  . Norvasc [Amlodipine Besylate] Other (See Comments)    Fluid buildup in chest  . Omeprazole Other (See Comments)    MD stopped due to pancreatitis  . Prednisone Other (See Comments)    Mood swings      Consultations:  GI   Other Procedures/Studies: CT ABDOMEN PELVIS W CONTRAST  Result Date: 10/14/2019 CLINICAL DATA:  Acute pancreatitis. EXAM: CT ABDOMEN AND PELVIS WITH CONTRAST TECHNIQUE: Multidetector CT imaging of the abdomen and pelvis was performed using the standard protocol following bolus administration of intravenous contrast. CONTRAST:  168mL OMNIPAQUE IOHEXOL 300 MG/ML  SOLN COMPARISON:  Aug 20, 2019. FINDINGS:  Lower chest: No acute abnormality. Hepatobiliary: No focal liver abnormality is seen. Status post cholecystectomy. No biliary dilatation. Pancreas: Mild inflammatory changes are noted around the pancreatic body and tail suggesting acute pancreatitis. No definite pseudocyst formation is seen at this time. No significant ductal dilatation is noted. Stable calcification is seen involving the uncinate process of the pancreatic head suggesting prior pancreatitis. Spleen: Normal in size without focal abnormality. Adrenals/Urinary Tract: Adrenal glands appear normal. 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 Stable nonobstructive calculi seen in lower pole collecting system of left kidney. No hydronephrosis or renal  obstruction is noted. Urinary bladder is unremarkable. Stomach/Bowel: Stomach is within normal limits. Appendix appears normal. No evidence of bowel wall thickening, distention, or inflammatory changes. Vascular/Lymphatic: No significant vascular findings are present. No enlarged abdominal or pelvic lymph nodes. Reproductive: Prostate is unremarkable. Other: No abdominal wall hernia or abnormality. No abdominopelvic ascites. Musculoskeletal: No acute or significant osseous findings. IMPRESSION: 1. Mild inflammatory changes are noted around the pancreatic body and tail suggesting acute pancreatitis. No definite pseudocyst formation is noted at this time. 2. Stable calcification is seen involving the uncinate process of the pancreatic head suggesting prior pancreatitis. 3. Stable severe atrophy of right kidney is noted with associated cysts which is stable. 4. Stable nonobstructive left renal calculi. No hydronephrosis or renal obstruction is noted. Electronically Signed   By: Marijo Conception M.D.   On: 10/14/2019 13:48   DG Chest Portable 1 View  Result Date: 10/13/2019 CLINICAL DATA:  Chest pain and epigastric abdominal pain. Shortness of breath. EXAM: PORTABLE CHEST 1 VIEW COMPARISON:  09/13/2019 FINDINGS: The heart size and mediastinal contours are within normal limits. Both lungs are clear. The visualized skeletal structures are unremarkable. IMPRESSION: Normal exam. Electronically Signed   By: Lorriane Shire M.D.   On: 10/13/2019 11:45     TODAY-DAY OF DISCHARGE:  Subjective:   Petra Sargeant today has no headache,no chest abdominal pain,no new weakness tingling or numbness, feels much better wants to go home today.   Objective:   Blood pressure (!) 126/89, pulse 58, temperature 99.1 F (37.3 C), temperature source Oral, resp. rate 18, height 6\' 1"  (1.854 m), weight 77.1 kg, SpO2 100 %.  Intake/Output Summary (Last 24 hours) at 11/06/2019 1551 Last data filed at 11/06/2019 1055 Gross per 24  hour  Intake 1735 ml  Output --  Net 1735 ml   Filed Weights   11/06/19 0448 11/06/19 1022  Weight: 78.5 kg 77.1 kg    Exam: Awake Alert, Oriented *3, No new F.N deficits, Normal affect [place].AT,PERRAL Supple Neck,No JVD, No cervical lymphadenopathy appriciated.  Symmetrical Chest wall movement, Good air movement bilaterally, CTAB RRR,No Gallops,Rubs or new Murmurs, No Parasternal Heave +ve B.Sounds, Abd Soft, Non tender, No organomegaly appriciated, No rebound -guarding or rigidity. No Cyanosis, Clubbing or edema, No new Rash or bruise   PERTINENT RADIOLOGIC STUDIES: No results found.   PERTINENT LAB RESULTS: CBC: Recent Labs    11/05/19 1555 11/05/19 1555 11/06/19 0126 11/06/19 0241  WBC 7.0  --   --  6.6  HGB 8.7*   < > 8.4* 8.7*  HCT 28.1*   < > 26.4* 27.7*  PLT 214  --   --  199   < > = values in this interval not displayed.   CMET CMP     Component Value Date/Time   NA 140 11/06/2019 0241   K 3.6 11/06/2019 0241   CL  107 11/06/2019 0241   CO2 24 11/06/2019 0241   GLUCOSE 93 11/06/2019 0241   BUN 5 (L) 11/06/2019 0241   CREATININE 1.15 11/06/2019 0241   CALCIUM 8.4 (L) 11/06/2019 0241   PROT 5.8 (L) 11/06/2019 0241   ALBUMIN 3.3 (L) 11/06/2019 0241   AST 34 11/06/2019 0241   ALT 28 11/06/2019 0241   ALKPHOS 35 (L) 11/06/2019 0241   BILITOT 0.7 11/06/2019 0241   GFRNONAA >60 11/06/2019 0241   GFRAA >60 11/06/2019 0241    GFR Estimated Creatinine Clearance: 83.8 mL/min (by C-G formula based on SCr of 1.15 mg/dL). Recent Labs    11/05/19 1024  LIPASE 30   No results for input(s): CKTOTAL, CKMB, CKMBINDEX, TROPONINI in the last 72 hours. Invalid input(s): POCBNP No results for input(s): DDIMER in the last 72 hours. No results for input(s): HGBA1C in the last 72 hours. No results for input(s): CHOL, HDL, LDLCALC, TRIG, CHOLHDL, LDLDIRECT in the last 72 hours. No results for input(s): TSH, T4TOTAL, T3FREE, THYROIDAB in the last 72 hours.  Invalid  input(s): FREET3 No results for input(s): VITAMINB12, FOLATE, FERRITIN, TIBC, IRON, RETICCTPCT in the last 72 hours. Coags: No results for input(s): INR in the last 72 hours.  Invalid input(s): PT Microbiology: Recent Results (from the past 240 hour(s))  SARS Coronavirus 2 by RT PCR (hospital order, performed in [facility] hospital lab) Nasopharyngeal Nasopharyngeal Swab     Status: None   Collection Time: 11/02/19  3:02 AM   Specimen: Nasopharyngeal Swab  Result Value Ref Range Status   SARS Coronavirus 2 NEGATIVE NEGATIVE Final    Comment: (NOTE) SARS-CoV-2 target nucleic acids are NOT DETECTED.  The SARS-CoV-2 RNA is generally detectable in upper and lower respiratory specimens during the acute phase of infection. The lowest concentration of SARS-CoV-2 viral copies this assay can detect is 250 copies / mL. A negative result does not preclude SARS-CoV-2 infection and should not be used as the sole basis for treatment or other patient management decisions.  A negative result may occur with improper specimen collection / handling, submission of specimen other than nasopharyngeal swab, presence of viral mutation(s) within the areas targeted by this assay, and inadequate number of viral copies (<250 copies / mL). A negative result must be combined with clinical observations, patient history, and epidemiological information.  Fact Sheet for Patients:   StrictlyIdeas.no  Fact Sheet for Healthcare Providers: BankingDealers.co.za  This test is not yet approved or  cleared by the [place] FDA and has been authorized for detection and/or diagnosis of SARS-CoV-2 by FDA under an Emergency Use Authorization (EUA).  This EUA will remain in effect (meaning this test can be used) for the duration of the COVID-19 declaration under Section 564(b)(1) of the Act, 21 U.S.C. section 360bbb-3(b)(1), unless the authorization is terminated  or revoked sooner.  Performed at [facility], [place] [address] [place], [place] [address]   SARS Coronavirus 2 by RT PCR (hospital order, performed in [facility] hospital lab) Nasopharyngeal Nasopharyngeal Swab     Status: None   Collection Time: 11/05/19  8:21 PM   Specimen: Nasopharyngeal Swab  Result Value Ref Range Status   SARS Coronavirus 2 NEGATIVE NEGATIVE Final    Comment: (NOTE) SARS-CoV-2 target nucleic acids are NOT DETECTED.  The SARS-CoV-2 RNA is generally detectable in upper and lower respiratory specimens during the acute phase of infection. The lowest concentration of SARS-CoV-2 viral copies this assay can detect is 250 copies / mL. A  negative result does not preclude SARS-CoV-2 infection and should not be used as the sole basis for treatment or other patient management decisions.  A negative result may occur with improper specimen collection / handling, submission of specimen other than nasopharyngeal swab, presence of viral mutation(s) within the areas targeted by this assay, and inadequate number of viral copies (<250 copies / mL). A negative result must be combined with clinical observations, patient history, and epidemiological information.  Fact Sheet for Patients:   StrictlyIdeas.no  Fact Sheet for Healthcare Providers: BankingDealers.co.za  This test is not yet approved or  cleared by the [place] FDA and has been authorized for detection and/or diagnosis of SARS-CoV-2 by FDA under an Emergency Use Authorization (EUA).  This EUA will remain in effect (meaning this test can be used) for the duration of the COVID-19 declaration under Section 564(b)(1) of the Act, 21 U.S.C. section 360bbb-3(b)(1), unless the authorization is terminated or revoked sooner.  Performed at [facility], [place] [address], [place], [place] [address]     FURTHER DISCHARGE INSTRUCTIONS:  Get Medicines reviewed  and adjusted: Please take all your medications with you for your next visit with your Primary MD  Laboratory/radiological data: Please request your Primary MD to go over all hospital tests and procedure/radiological results at the follow up, please ask your Primary MD to get all Hospital records sent to his/her office.  In some cases, they will be blood work, cultures and biopsy results pending at the time of your discharge. Please request that your primary care M.D. goes through all the records of your hospital data and follows up on these results.  Also Note the following: If you experience worsening of your admission symptoms, develop shortness of breath, life threatening emergency, suicidal or homicidal thoughts you must seek medical attention immediately by calling 911 or calling your MD immediately  if symptoms less severe.  You must read complete instructions/literature along with all the possible adverse reactions/side effects for all the Medicines you take and that have been prescribed to you. Take any new Medicines after you have completely understood and accpet all the possible adverse reactions/side effects.   Do not drive when taking Pain medications or sleeping medications (Benzodaizepines)  Do not take more than prescribed Pain, Sleep and Anxiety Medications. It is not advisable to combine anxiety,sleep and pain medications without talking with your primary care practitioner  Special Instructions: If you have smoked or chewed Tobacco  in the last 2 yrs please stop smoking, stop any regular Alcohol  and or any Recreational drug use.  Wear Seat belts while driving.  Please note: You were cared for by a hospitalist during your hospital stay. Once you are discharged, your primary care physician will handle any further medical issues. Please note that NO REFILLS for any discharge medications will be authorized once you are discharged, as it is imperative that you return to your  primary care physician (or establish a relationship with a primary care physician if you do not have one) for your post hospital discharge needs so that they can reassess your need for medications and monitor your lab values.  Total Time spent coordinating discharge including counseling, education and face to face time equals 25 minutes.  SignedOren Binet 11/06/2019 3:51 PM

## 2019-11-06 NOTE — Anesthesia Postprocedure Evaluation (Signed)
Anesthesia Post Note  Patient: Kyle Berry  Procedure(s) Performed: ESOPHAGOGASTRODUODENOSCOPY (EGD) WITH PROPOFOL (N/A )     Patient location during evaluation: Endoscopy Anesthesia Type: MAC Level of consciousness: awake and alert, patient cooperative and oriented Pain management: pain level controlled Vital Signs Assessment: post-procedure vital signs reviewed and stable Respiratory status: spontaneous breathing, nonlabored ventilation and respiratory function stable Cardiovascular status: blood pressure returned to baseline and stable Postop Assessment: no apparent nausea or vomiting Anesthetic complications: no   No complications documented.  Last Vitals:  Vitals:   11/06/19 1022 11/06/19 1054  BP: (!) 130/85 98/74  Pulse:    Resp: 16 16  Temp: 36.6 C   SpO2: 100% 100%    Last Pain:  Vitals:   11/06/19 1022  TempSrc: Temporal  PainSc: 0-No pain                 Harlea Goetzinger,E. [name]

## 2019-11-06 NOTE — Op Note (Signed)
[facility] Patient Name: Kyle Berry Procedure Date : 11/06/2019 MRN: [id number] Attending MD: Docia Chuck. Henrene Pastor , MD Date of Birth: 1970-10-08 CSN: [id number] Age: 49 Admit Type: Inpatient Procedure:                Upper GI endoscopy Indications:              Abdominal pain, Coffee-ground emesis Providers:                Docia Chuck. Henrene Pastor, MD, Carmie End, RN, Theodora Blow, Technician Referring MD:             [facility] hospitalist Medicines:                Monitored Anesthesia Care Complications:            No immediate complications. Estimated Blood Loss:     Estimated blood loss: none. Procedure:                Pre-Anesthesia Assessment:                           - Prior to the procedure, a History and Physical                            was performed, and patient medications and                            allergies were reviewed. The patient's tolerance of                            previous anesthesia was also reviewed. The risks                            and benefits of the procedure and the sedation                            options and risks were discussed with the patient.                            All questions were answered, and informed consent                            was obtained. Prior Anticoagulants: The patient has                            taken no previous anticoagulant or antiplatelet                            agents. ASA Grade Assessment: II - A patient with                            mild systemic disease. After reviewing the risks  and benefits, the patient was deemed in                            satisfactory condition to undergo the procedure.                           After obtaining informed consent, the endoscope was                            passed under direct vision. Throughout the                            procedure, the patient's blood pressure, pulse, and                             oxygen saturations were monitored continuously. The                            [id number]-H190 ([id number] Olympus gastroscope was                            introduced through the mouth, and advanced to the                            second part of duodenum. The upper GI endoscopy was                            accomplished without difficulty. The patient                            tolerated the procedure well. Scope In: Scope Out: Findings:      The esophagus was normal.      The stomach revealed one small patch of erythematous mucosa in the       proximal stomach consistent with retching gastropathy. No active       bleeding. The stomach was otherwise normal.      The examined duodenum was normal.      The cardia and gastric fundus were normal on retroflexion.. Small hiatal       hernia noted Impression:               1. Mild retching gastropathy                           2. Otherwise unremarkable EGD                           3. No evidence for clinically significant GI                            bleeding                           4. Posthemorrhagic anemia from recent post                            polypectomy  bleed. Recommendation:           1. Advance diet                           2. Antinausea medicine as needed                           3. Okay to go home from GI standpoint                           Findings discussed with patient. He was provided a                            copy of his procedure report. We will sign off.                           . Procedure Code(s):        --- Professional ---                           [phone], Esophagogastroduodenoscopy, flexible,                            transoral; diagnostic, including collection of                            specimen(s) by brushing or washing, when performed                            (separate procedure) Diagnosis Code(s):        --- Professional ---                           R10.9, Unspecified abdominal pain                            K92.0, Hematemesis CPT copyright 2019 American Medical Association. All rights reserved. The codes documented in this report are preliminary and upon coder review may  be revised to meet current compliance requirements. Docia Chuck. Henrene Pastor, MD 11/06/2019 11:05:19 AM This report has been signed electronically. Number of Addenda: 0

## 2019-11-06 NOTE — ED Notes (Signed)
Report given to [facility]

## 2019-11-06 NOTE — Progress Notes (Signed)
Seen and examined-chart reviewed.  Patient anxious to go home-he has tolerated advancement in diet-and wants to be discharged right away.  See discharge summary for further details.

## 2019-11-06 NOTE — Discharge Instructions (Signed)
Anemia  Anemia is a condition in which you do not have enough red blood cells or hemoglobin. Hemoglobin is a substance in red blood cells that carries oxygen. When you do not have enough red blood cells or hemoglobin (are anemic), your body cannot get enough oxygen and your organs may not work properly. As a result, you may feel very tired or have other problems. What are the causes? Common causes of anemia include:  Excessive bleeding. Anemia can be caused by excessive bleeding inside or outside the body, including bleeding from the intestine or from periods in women.  Poor nutrition.  Long-lasting (chronic) kidney, thyroid, and liver disease.  Bone marrow disorders.  Cancer and treatments for cancer.  HIV (human immunodeficiency virus) and AIDS (acquired immunodeficiency syndrome).  Treatments for HIV and AIDS.  Spleen problems.  Blood disorders.  Infections, medicines, and autoimmune disorders that destroy red blood cells. What are the signs or symptoms? Symptoms of this condition include:  Minor weakness.  Dizziness.  Headache.  Feeling heartbeats that are irregular or faster than normal (palpitations).  Shortness of breath, especially with exercise.  Paleness.  Cold sensitivity.  Indigestion.  Nausea.  Difficulty sleeping.  Difficulty concentrating. Symptoms may occur suddenly or develop slowly. If your anemia is mild, you may not have symptoms. How is this diagnosed? This condition is diagnosed based on:  Blood tests.  Your medical history.  A physical exam.  Bone marrow biopsy. Your health care provider may also check your stool (feces) for blood and may do additional testing to look for the cause of your bleeding. You may also have other tests, including:  Imaging tests, such as a CT scan or MRI.  Endoscopy.  Colonoscopy. How is this treated? Treatment for this condition depends on the cause. If you continue to lose a lot of blood, you may  need to be treated at a hospital. Treatment may include:  Taking supplements of iron, vitamin S31, or folic acid.  Taking a hormone medicine (erythropoietin) that can help to stimulate red blood cell growth.  Having a blood transfusion. This may be needed if you lose a lot of blood.  Making changes to your diet.  Having surgery to remove your spleen. Follow these instructions at home:  Take over-the-counter and prescription medicines only as told by your health care provider.  Take supplements only as told by your health care provider.  Follow any diet instructions that you were given.  Keep all follow-up visits as told by your health care provider. This is important. Contact a health care provider if:  You develop new bleeding anywhere in the body. Get help right away if:  You are very weak.  You are short of breath.  You have pain in your abdomen or chest.  You are dizzy or feel faint.  You have trouble concentrating.  You have bloody or black, tarry stools.  You vomit repeatedly or you vomit up blood. Summary  Anemia is a condition in which you do not have enough red blood cells or enough of a substance in your red blood cells that carries oxygen (hemoglobin).  Symptoms may occur suddenly or develop slowly.  If your anemia is mild, you may not have symptoms.  This condition is diagnosed with blood tests as well as a medical history and physical exam. Other tests may be needed.  Treatment for this condition depends on the cause of the anemia. This information is not intended to replace advice given to you by  your health care provider. Make sure you discuss any questions you have with your health care provider. Document Revised: 03/15/2017 Document Reviewed: 05/04/2016 Elsevier Patient Education  [place].

## 2019-11-06 NOTE — Anesthesia Preprocedure Evaluation (Signed)
Anesthesia Evaluation  Patient identified by MRN, date of birth, ID band Patient awake    Reviewed: Allergy & Precautions, NPO status , Patient's Chart, lab work & pertinent test results  History of Anesthesia Complications Negative for: history of anesthetic complications  Airway Mallampati: I  TM Distance: >3 FB Neck ROM: Full    Dental  (+) Missing, Chipped, Dental Advisory Given   Pulmonary neg pulmonary ROS,  11/05/2019 SARS coronavirus NEG   breath sounds clear to auscultation       Cardiovascular hypertension,  Rhythm:Regular Rate:Normal     Neuro/Psych  Headaches,    GI/Hepatic GERD  Controlled,H/0 pancreatitis   Endo/Other  negative endocrine ROS  Renal/GU stones     Musculoskeletal   Abdominal   Peds  Hematology  (+) Blood dyscrasia (Hb 8.7), anemia ,   Anesthesia Other Findings   Reproductive/Obstetrics                             Anesthesia Physical Anesthesia Plan  ASA: II  Anesthesia Plan: MAC   Post-op Pain Management:    Induction:   PONV Risk Score and Plan: 1 and Treatment may vary due to age or medical condition  Airway Management Planned: Natural Airway and Nasal Cannula  Additional Equipment: None  Intra-op Plan:   Post-operative Plan:   Informed Consent: I have reviewed the patients History and Physical, chart, labs and discussed the procedure including the risks, benefits and alternatives for the proposed anesthesia with the patient or authorized representative who has indicated his/her understanding and acceptance.     Dental advisory given  Plan Discussed with: CRNA and Surgeon  Anesthesia Plan Comments:         Anesthesia Quick Evaluation

## 2019-11-06 NOTE — Telephone Encounter (Signed)
Spoke with the patient. He states he is doing well. He has no concerns or symptoms. Agrees to come for H&H on Monday.

## 2019-11-06 NOTE — ED Notes (Signed)
Patient up walking to bathroom, gait steady 

## 2019-11-06 NOTE — Consult Note (Addendum)
[facility] Consult: 8:30 AM 11/06/2019  LOS: 0 days    Referring Provider: Dr Nena Alexander  Primary Care Physician:  [name] (Inactive) Primary Gastroenterologist:  Dr. Silverio Decamp Pt cell phone [phone]   Reason for Consultation:  hematemesis   HPI: Kyle Berry is a 49 y.o. male.  PMH below.  Chronic pancreatitis with recurrent episodes acute pancreatitis (Etoh, ACE inhibitor, pancreatic divisum, biliary origin?).  Lap chole 2019.  CKD 3.     Investigation of N/V, abd pain w:  10/14/19 CTAP w contrast: mild inflammation pancreatic bodyand tail.  No pseudocyst.  Stable calcification at uncinate.  Lipase 6/30: 197 10/30/19 colonoscopy. Fair prep.  20 mm cecal polyp hot snared.  30 mm rectomsigmoid polyp hot snared and site clipped.  Smaller polyps removed as well.  Diverticulosis and int rrhoids noted. Path on all polyps: TA w/o HGD.    10/30/19 EGD: normal z line, gastritis (path: mild, chronic gastritis w/o H pylori)   Developed post polypectomy bleed, Hgb 12.8 >> 11.5 and admitted 7/18 - 7/19    7/19 colonoscopy: ulcer and pigmented protuberance at cecum, site clipped x 4.  Large polypectomy with pigemented protuberance at sigmoid also clipped x 3.  Diverticulosis.     Hgb 8.7, MCV 102.  BUN 7.  INR 1.1.  Lipase 30.    Weak, dizzy since discharge.  BP 90/60 at PMD office yest.  Sent to ED. Blood in emesis x 2 in ED, moderate amount first episode, small streaks blood on 2nd episode.   Chonic abd pain unchanged and not severe in LUQ. Patient says that he does not normally have nausea or vomiting very often so the vomiting yesterday was unusual. He feels nauseous today but has not vomited. Since discharge his stools have been brown, occurring every day as well as this morning.  Home meds  include Creon, prn zofran/Phenergan and hydrocodone of which he does not use frequently.  No PPI or H2 blocker. No NSAIDs, ASA  Received 1 PRBC overnight and feels better, the dizziness is gone when he walked to the bathroom this morning he felt fine. A headache which she had yesterday is also resolved.   Past Medical History:  Diagnosis Date  . Acute pancreatitis 11/23/2015  . Chronic bronchitis ([place])   . Chronic lower back pain   . Congenital single kidney   . GERD (gastroesophageal reflux disease)   . Headache    "once/month" (05/13/2017)  . High cholesterol   . History of kidney stones   . Hypertension   . Migraine    "a couple/year" (05/13/2017)  . Nephrolithiasis 05/13/2017  . Pneumonia ~ 09/2015  . Recurrent acute pancreatitis   . Renal disorder     Past Surgical History:  Procedure Laterality Date  . CHOLECYSTECTOMY N/A 10/23/2017   Procedure: LAPAROSCOPIC CHOLECYSTECTOMY WITH INTRAOPERATIVE CHOLANGIOGRAM;  Surgeon: Johnathan Hausen, MD;  Location: [facility] ORS;  Service: General;  Laterality: N/A;  . COLONOSCOPY WITH PROPOFOL N/A 11/02/2019   Procedure: COLONOSCOPY WITH PROPOFOL;  Surgeon: Irene Shipper, MD;  Location: [facility] ENDOSCOPY;  Service: Endoscopy;  Laterality: N/A;  . HEMOSTASIS CLIP PLACEMENT  11/02/2019   Procedure: HEMOSTASIS CLIP PLACEMENT;  Surgeon: Irene Shipper, MD;  Location: [facility] ENDOSCOPY;  Service: Endoscopy;;  . NO PAST SURGERIES      Prior to Admission medications   Medication Sig Start Date End Date Taking? Authorizing Provider  lipase/protease/amylase (CREON) 36000 UNITS CPEP capsule Take 1 capsule (36,000 Units total) by mouth See admin instructions. Take two capsules (36,000 units) by mouth with meals (3 meals daily) and one capsule (36000 units) with snacks daily Patient taking differently: Take 36,000-72,000 Units by mouth See admin instructions. Take two capsules (36,000 units) by mouth with meals (3 meals daily) and one capsule (36000 units) with snacks  daily 09/07/19  Yes Nandigam, Venia Minks, MD  HYDROcodone-acetaminophen (NORCO) 5-325 MG tablet Take 1 tablet by mouth every 4 (four) hours as needed for severe pain. Patient not taking: Reported on 11/05/2019 10/13/19   Sherwood Gambler, MD  ondansetron (ZOFRAN ODT) 4 MG disintegrating tablet Take 1 tablet (4 mg total) by mouth every 8 (eight) hours as needed for nausea or vomiting. Patient not taking: Reported on 11/05/2019 03/12/19   Fransico Meadow, PA-C  promethazine (PHENERGAN) 25 MG tablet Take 1 tablet (25 mg total) by mouth every 6 (six) hours as needed for nausea or vomiting. Patient not taking: Reported on 11/05/2019 10/13/19   Sherwood Gambler, MD  tetrahydrozoline 0.05 % ophthalmic solution Place 1 drop into both eyes daily as needed (Dry eyes). Patient not taking: Reported on 11/05/2019    [provider]    Scheduled Meds: . lipase/protease/amylase  72,000 Units Oral TID WC   And  . lipase/protease/amylase  36,000 Units Oral With snacks   Infusions:  PRN Meds: acetaminophen **OR** acetaminophen, HYDROcodone-acetaminophen, HYDROmorphone (DILAUDID) injection, ondansetron **OR** ondansetron (ZOFRAN) IV, promethazine   Allergies as of 11/05/2019 - Review Complete 11/05/2019  Allergen Reaction Noted  . Diclofenac Hives 12/14/2013  . Norvasc [amlodipine besylate] Other (See Comments) 08/15/2016  . Omeprazole Other (See Comments) 08/15/2016  . Prednisone Other (See Comments) 12/14/2013    Family History  Problem Relation Age of Onset  . Hypertension Other   . Hypertension Mother   . Hypertension Father   . Kidney disease Father   . Hypertension Sister   . Diabetes Sister   . Hypertension Brother     Social History   Socioeconomic History  . Marital status: Single    Spouse name: Not on file  . Number of children: Not on file  . Years of education: Not on file  . Highest education level: Not on file  Occupational History  . Not on file  Tobacco Use  . Smoking  status: Never Smoker  . Smokeless tobacco: Never Used  Vaping Use  . Vaping Use: Never used  Substance and Sexual Activity  . Alcohol use: Not Currently    Comment: 05/13/2017 "might have beer a few times/year"  . Drug use: No  . Sexual activity: Yes    Birth control/protection: Condom  Other Topics Concern  . Not on file  Social History Narrative  . Not on file   Social Determinants of Health   Financial Resource Strain:   . Difficulty of Paying Living Expenses:   Food Insecurity:   . Worried About Charity fundraiser in the Last Year:   . Arboriculturist in the Last Year:   Transportation Needs:   . Film/video editor (Medical):   Marland Kitchen  Lack of Transportation (Non-Medical):   Physical Activity:   . Days of Exercise per Week:   . Minutes of Exercise per Session:   Stress:   . Feeling of Stress :   Social Connections:   . Frequency of Communication with Friends and Family:   . Frequency of Social Gatherings with Friends and Family:   . Attends Religious Services:   . Active Member of Clubs or Organizations:   . Attends Archivist Meetings:   Marland Kitchen Marital Status:   Intimate Partner Violence:   . Fear of Current or Ex-Partner:   . Emotionally Abused:   Marland Kitchen Physically Abused:   . Sexually Abused:     REVIEW OF SYSTEMS: Constitutional:  Weakness, now resolved ENT:  No nose bleeds Pulm: No shortness of breath CV:  No palpitations, no LE edema. No chest pain. GU:  No hematuria, no frequency GI: See HPI Heme: No excessive or unusual bleeding or bruising. Transfusions: See HPI Neuro: Resolved headache. Resolved tingling in his head and fingers. Derm:  No itching, no rash or sores.  Endocrine:  No sweats or chills.  No polyuria or dysuria Immunization: Not queried.   PHYSICAL EXAM: Vital signs in last 24 hours: Vitals:   11/06/19 0442 11/06/19 0445  BP: (!) 125/89 (!) 119/91  Pulse: 66 59  Resp:    Temp:    SpO2: 100% 100%   Wt Readings from Last 3  Encounters:  11/06/19 78.5 kg  11/02/19 76.2 kg  10/30/19 74.8 kg    General: Pleasant, calm, looks well. Provides excellent history. Head: No facial asymmetry or swelling. No signs of head trauma. Eyes: No conjunctival pallor, no scleral icterus Ears: No hearing deficit Nose: No congestion or discharge Mouth: No blood in the mouth. Tongue midline. Mucosa moist, pink, clear. Neck: No JVD, no masses, no thyromegaly Lungs: Clear with excellent breath sounds bilaterally. No shortness of breath or cough Heart: RRR. No MRG. S1, S2 present. Abdomen: Nondistended. Active bowel sounds. Minimal tenderness in the left upper quadrant. No HSM, masses, bruits, hernias.   Rectal: Deferred Musc/Skeltl: No joint redness, swelling or gross deformity Extremities: No CCE Neurologic: Alert. Fully oriented. Provides excellent history. Moves all 4 limbs with full strength. No tremors. Skin: No rashes, no sores Nodes: No cervical adenopathy Psych: Calm, pleasant, normal affect. Fluid speech  Intake/Output from previous day: 07/22 0701 - 07/23 0700 In: 1635 [I.V.:5; Blood:630; IV Piggyback:1000] Out: -  Intake/Output this shift: No intake/output data recorded.  LAB RESULTS: Recent Labs    11/05/19 1555 11/06/19 0126 11/06/19 0241  WBC 7.0  --  6.6  HGB 8.7* 8.4* 8.7*  HCT 28.1* 26.4* 27.7*  PLT 214  --  199   BMET Lab Results  Component Value Date   NA 140 11/06/2019   NA 139 11/05/2019   NA 139 11/02/2019   K 3.6 11/06/2019   K 4.3 11/05/2019   K 4.2 11/02/2019   CL 107 11/06/2019   CL 105 11/05/2019   CL 109 11/02/2019   CO2 24 11/06/2019   CO2 26 11/05/2019   CO2 21 (L) 11/02/2019   GLUCOSE 93 11/06/2019   GLUCOSE 99 11/05/2019   GLUCOSE 124 (H) 11/02/2019   BUN 5 (L) 11/06/2019   BUN 7 11/05/2019   BUN 13 11/02/2019   CREATININE 1.15 11/06/2019   CREATININE 1.17 11/05/2019   CREATININE 1.37 (H) 11/02/2019   CALCIUM 8.4 (L) 11/06/2019   CALCIUM 8.9 11/05/2019   CALCIUM  8.5 (L)  11/02/2019   LFT Recent Labs    11/06/19 0241  PROT 5.8*  ALBUMIN 3.3*  AST 34  ALT 28  ALKPHOS 35*  BILITOT 0.7   PT/INR Lab Results  Component Value Date   INR 1.1 11/02/2019   INR 1.11 10/19/2017   Hepatitis Panel No results for input(s): HEPBSAG, HCVAB, HEPAIGM, HEPBIGM in the last 72 hours. C-Diff No components found for: CDIFF Lipase     Component Value Date/Time   LIPASE 30 11/05/2019 1024    Drugs of Abuse     Component Value Date/Time   LABOPIA POSITIVE (A) 10/12/2017 0300   COCAINSCRNUR NONE DETECTED 10/12/2017 0300   LABBENZ NONE DETECTED 10/12/2017 0300   AMPHETMU NONE DETECTED 10/12/2017 0300   THCU NONE DETECTED 10/12/2017 0300   LABBARB (A) 10/12/2017 0300    Result not available. Reagent lot number recalled by manufacturer.     RADIOLOGY STUDIES: No results found.    IMPRESSION:   *   Anemia.  Hgb drop ~ 3 gm over 5 days.   Polypectomy bleed treated with endoclips on 7/19  *   Limited hematemesis. Had mild gastritis on EGD last week. Rule out Mallory-Weiss tear, rule out Dieulafoy's lesion.  Not on PPI currently or at home.    *   Hx intermittent, recurrent N/V, abd pain.  Chronic pancreatitis.  Acute pancreatitis at end of June.  Current Lipase and LFTs unremarkable.  *  CKD stage 3.  currently normal GFR   PLAN:     *  EGD this afternoon.  Ate clears ~ 730 AM and now NPO.   Decide about PPI after EGD.     Azucena Freed  11/06/2019, 8:30 AM Phone [phone]  GI ATTENDING  History, laboratories, x-rays reviewed. Patient personally seen and examined. Agree with comprehensive consultation note as outlined above. Patient known to me as he underwent colonoscopy 4 days ago for treatment of post polypectomy bleed. Did well and went home. Returns with nausea vomiting abdominal pain and minor hematemesis. Hemoglobin dropped over several days noted. However, stools are brown. I suspect that hemoglobin drop was requalification from  the acute bleed. He did have a upper endoscopy 1 week ago which was unremarkable. However, given the hematemesis and drop in hemoglobin, it is reasonable to repeat upper endoscopy at this time. Patient is feeling better after IV hydration and symptomatic therapies.  Docia Chuck. Geri Seminole., M.D. [facility] Division of Gastroenterology

## 2019-11-07 ENCOUNTER — Encounter (HOSPITAL_COMMUNITY): Payer: Self-pay | Admitting: Internal Medicine

## 2019-11-09 ENCOUNTER — Other Ambulatory Visit (INDEPENDENT_AMBULATORY_CARE_PROVIDER_SITE_OTHER): Payer: Managed Care, Other (non HMO)

## 2019-11-09 DIAGNOSIS — K922 Gastrointestinal hemorrhage, unspecified: Secondary | ICD-10-CM | POA: Diagnosis not present

## 2019-11-09 LAB — HEMOGLOBIN AND HEMATOCRIT, BLOOD
HCT: 29 % — ABNORMAL LOW (ref 39.0–52.0)
Hemoglobin: 9.9 g/dL — ABNORMAL LOW (ref 13.0–17.0)

## 2019-11-10 ENCOUNTER — Other Ambulatory Visit: Payer: Self-pay

## 2019-11-10 DIAGNOSIS — D649 Anemia, unspecified: Secondary | ICD-10-CM

## 2019-11-11 NOTE — Addendum Note (Signed)
Addendum  created 11/11/19 1118 by Catalina Gravel, MD   Intraprocedure Staff edited

## 2019-11-18 ENCOUNTER — Telehealth: Payer: Self-pay | Admitting: Gastroenterology

## 2019-11-18 NOTE — Telephone Encounter (Signed)
Pt called inquiring about when he will be able to resume his BP medication as he was told to stop it when he was hospitalized on 7/23 and had endo and colon. Pls call him.

## 2019-11-19 NOTE — Telephone Encounter (Signed)
Patient will follow up with GI on 12/09/19. He was on Micardis most recently, but I do not see that he was on any anti-hypertensive medication at the last hospital admission. No diagnosis of cirrhosis. Would this decision come from [place] or his PCP? I called the patient. No answer. Left him a voicemail to call me back.

## 2019-11-19 NOTE — Telephone Encounter (Signed)
Patient says he is on [place]. He has also called the PCP and is waiting for a return call from that office. He is aware Dr Silverio Decamp is out of the office until next week.

## 2019-11-19 NOTE — Telephone Encounter (Signed)
Pt is requesting a call back from [name]  

## 2019-11-25 NOTE — Telephone Encounter (Signed)
Is likely having bleeding from  internal hemorrhoids, avoid excessive straining, sitz bath, please advise him to use Preparation H suppository per rectum at bedtime for 5 to 7 days.  I will evaluate the hemorrhoids when he comes in for his follow-up visit.

## 2019-11-25 NOTE — Telephone Encounter (Signed)
Called to tell the patient. He has addressed this with his PCP. New issue He is seeing blood with his bowel movement again. He denies any nausea, abdominal pain or rectal pain. He sees blood on the tissue with cleaning as well. He is not using any hemorrhoidal cream or suppository. He has seen the blood the past 2 days. His follow up appointment is on 12/09/19. Please advise.

## 2019-11-25 NOTE — Telephone Encounter (Signed)
He will need to check with PMD regarding blood pressure management and medications.  Thank you

## 2019-11-26 NOTE — Telephone Encounter (Signed)
Advised of this. Patient agrees with this plan.

## 2019-12-09 ENCOUNTER — Ambulatory Visit (INDEPENDENT_AMBULATORY_CARE_PROVIDER_SITE_OTHER): Payer: Managed Care, Other (non HMO) | Admitting: Gastroenterology

## 2019-12-09 ENCOUNTER — Encounter: Payer: Self-pay | Admitting: Gastroenterology

## 2019-12-09 VITALS — BP 110/62 | HR 104 | Ht 73.0 in | Wt 164.0 lb

## 2019-12-09 DIAGNOSIS — K602 Anal fissure, unspecified: Secondary | ICD-10-CM | POA: Diagnosis not present

## 2019-12-09 DIAGNOSIS — K861 Other chronic pancreatitis: Secondary | ICD-10-CM

## 2019-12-09 MED ORDER — AMBULATORY NON FORMULARY MEDICATION: 0 refills | Status: DC

## 2019-12-09 NOTE — Progress Notes (Signed)
Kyle Berry    [id number]    [birth year]  Primary Care Physician:[name], Brayton Layman (Inactive)  Referring Physician: No referring provider defined for this encounter.   Chief complaint:  Rectal bleeding  HPI: 49 year old gentleman here for follow-up visit with complaints of intermittent bright red blood per rectum  He was hospitalized post EGD colonoscopy with post polypectomy bleed  Colonoscopy November 02, 2019 possible site of post polypectomy bleed from cecum, additional clips placed.  No further bleeding  He re presented to ER July 23 with complaints of abdominal pain, coffee-ground emesis stable hemoglobin underwent EGD that was unremarkable other than mild gastropathy.  Hemoglobin has remained stable  His chronic abdominal pain is improving.  Bowel habits are at baseline.  Denies any constipation or diarrhea.  He continues to have intermittent bright red blood per rectum after defecation with rectal discomfort   Colonoscopy October 30, 2019: 20 mm polyp removed with hot snare from cecum and 30 mm polyp removed with hot snare from rectosigmoid area that was clipped EGD October 30, 2019: Erosive gastritis otherwise unremarkable  10/2016 MRCP showed acute pancreatitis, suspicion for pancreas divisum, no evidence of chronic pancreatitis.  08/2016 MRI/MRCP raised question of chronic pancreatitis with focally dilated portion of dorsal pancreatic duct and fused mild irregularity of pancreatic duct.  Outpatient Encounter Medications as of 12/09/2019  Medication Sig  . lipase/protease/amylase (CREON) 36000 UNITS CPEP capsule Take 1 capsule (36,000 Units total) by mouth See admin instructions. Take two capsules (36,000 units) by mouth with meals (3 meals daily) and one capsule (36000 units) with snacks daily (Patient taking differently: Take 36,000-72,000 Units by mouth See admin instructions. Take two capsules (36,000 units) by mouth with meals (3 meals daily) and one  capsule (36000 units) with snacks daily)  . ondansetron (ZOFRAN ODT) 4 MG disintegrating tablet Take 1 tablet (4 mg total) by mouth every 8 (eight) hours as needed for nausea or vomiting.   No facility-administered encounter medications on file as of 12/09/2019.    Allergies as of 12/09/2019 - Review Complete 12/09/2019  Allergen Reaction Noted  . Diclofenac Hives 12/14/2013  . Norvasc [amlodipine besylate] Other (See Comments) 08/15/2016  . Omeprazole Other (See Comments) 08/15/2016  . Prednisone Other (See Comments) 12/14/2013    Past Medical History:  Diagnosis Date  . Acute pancreatitis 11/23/2015  . Chronic bronchitis ([place])   . Chronic lower back pain   . Congenital single kidney   . GERD (gastroesophageal reflux disease)   . Headache    "once/month" (05/13/2017)  . High cholesterol   . History of kidney stones   . Hypertension   . Migraine    "a couple/year" (05/13/2017)  . Nephrolithiasis 05/13/2017  . Pneumonia ~ 09/2015  . Recurrent acute pancreatitis   . Renal disorder     Past Surgical History:  Procedure Laterality Date  . CHOLECYSTECTOMY N/A 10/23/2017   Procedure: LAPAROSCOPIC CHOLECYSTECTOMY WITH INTRAOPERATIVE CHOLANGIOGRAM;  Surgeon: Johnathan Hausen, MD;  Location: [facility];  Service: General;  Laterality: N/A;  . COLONOSCOPY WITH PROPOFOL N/A 11/02/2019   Procedure: COLONOSCOPY WITH PROPOFOL;  Surgeon: Irene Shipper, MD;  Location: [facility] ENDOSCOPY;  Service: Endoscopy;  Laterality: N/A;  . ESOPHAGOGASTRODUODENOSCOPY (EGD) WITH PROPOFOL N/A 11/06/2019   Procedure: ESOPHAGOGASTRODUODENOSCOPY (EGD) WITH PROPOFOL;  Surgeon: Irene Shipper, MD;  Location: [facility] ENDOSCOPY;  Service: Endoscopy;  Laterality: N/A;  . HEMOSTASIS CLIP PLACEMENT  11/02/2019   Procedure: HEMOSTASIS CLIP PLACEMENT;  Surgeon:  Irene Shipper, MD;  Location: [facility] ENDOSCOPY;  Service: Endoscopy;;  . NO PAST SURGERIES      Family History  Problem Relation Age of Onset  . Hypertension Other   .  Hypertension Mother   . Hypertension Father   . Kidney disease Father   . Hypertension Sister   . Diabetes Sister   . Hypertension Brother   . Pancreatic cancer Paternal Grandmother   . Colon cancer Cousin   . Stomach cancer Neg Hx   . Esophageal cancer Neg Hx     Social History   Socioeconomic History  . Marital status: Single    Spouse name: Not on file  . Number of children: Not on file  . Years of education: Not on file  . Highest education level: Not on file  Occupational History  . Not on file  Tobacco Use  . Smoking status: Never Smoker  . Smokeless tobacco: Never Used  Vaping Use  . Vaping Use: Never used  Substance and Sexual Activity  . Alcohol use: Not Currently    Comment: 05/13/2017 "might have beer a few times/year"  . Drug use: No  . Sexual activity: Yes    Partners: Female    Birth control/protection: Condom  Other Topics Concern  . Not on file  Social History Narrative  . Not on file   Social Determinants of Health   Financial Resource Strain:   . Difficulty of Paying Living Expenses: Not on file  Food Insecurity:   . Worried About Charity fundraiser in the Last Year: Not on file  . Ran Out of Food in the Last Year: Not on file  Transportation Needs:   . Lack of Transportation (Medical): Not on file  . Lack of Transportation (Non-Medical): Not on file  Physical Activity:   . Days of Exercise per Week: Not on file  . Minutes of Exercise per Session: Not on file  Stress:   . Feeling of Stress : Not on file  Social Connections:   . Frequency of Communication with Friends and Family: Not on file  . Frequency of Social Gatherings with Friends and Family: Not on file  . Attends Religious Services: Not on file  . Active Member of Clubs or Organizations: Not on file  . Attends Archivist Meetings: Not on file  . Marital Status: Not on file  Intimate Partner Violence:   . Fear of Current or Ex-Partner: Not on file  . Emotionally Abused:  Not on file  . Physically Abused: Not on file  . Sexually Abused: Not on file      Review of systems: All other review of systems negative except as mentioned in the HPI.   Physical Exam: Vitals:   12/09/19 0959  BP: 110/62  Pulse: (!) 104   Body mass index is 21.64 kg/m. Gen:      No acute distress HEENT:  sclera anicteric Abd:      soft, non-tender; no palpable masses, no distension Ext:    No edema Neuro: alert and oriented x 3 Psych: normal mood and affect  Data Reviewed:  Reviewed labs, radiology imaging, old records and pertinent past GI work up   Assessment and Plan/Recommendations:  49 year old gentleman with history of chronic idiopathic pancreatitis  Intermittent bright red blood per rectum and rectal discomfort secondary to anal fissure noted on rectal exam Start Benefiber 1 teaspoon 3 times with meals Nitroglycerin 0.125%, advised patient apply small pea-sized amount per rectum 3  times daily for 6 to 8 weeks  Chronic pancreatitis with pancreatic insufficiency: Increase Creon to 72,000 units with meals and 36,000 units with snacks  Schedule for MRCP to evaluate for pancreas divisum and document resolution of pancreatitis, to be scheduled in 4 to 6 weeks.  Return in 2 to 3 months or sooner if needed   The patient was provided an opportunity to ask questions and all were answered. The patient agreed with the plan and demonstrated an understanding of the instructions.  Damaris Hippo , MD    CC: No ref. provider found

## 2019-12-09 NOTE — Patient Instructions (Addendum)
Increase your Creon -72,0000mg  with meals. 36,000mg  with snacks up to 2 snacks daily.   Benefiber- 1 teaspoon three times daily with meals.   Recticare - Use Recticare with Nitroglycerin as directed.   We have sent a prescription for nitroglycerin 0.125% gel to [facility]. You should apply a pea size amount to your rectum three times daily x 6-8 weeks.  [facility]'s information is below: Address: [address] [place], [place] [address]  [phone]  *Please DO NOT go directly from our office to pick up this medication! Give the pharmacy 1 day to process the prescription as this is compounded and takes time to make.  You will need a MRCP Sept/Oct 2021. Office will contact to schedule.   Thank you for choosing me and [facility].  Dr. Silverio Decamp

## 2019-12-23 ENCOUNTER — Encounter: Payer: Self-pay | Admitting: Gastroenterology

## 2020-01-06 ENCOUNTER — Other Ambulatory Visit (INDEPENDENT_AMBULATORY_CARE_PROVIDER_SITE_OTHER): Payer: Managed Care, Other (non HMO)

## 2020-01-06 DIAGNOSIS — D649 Anemia, unspecified: Secondary | ICD-10-CM | POA: Diagnosis not present

## 2020-01-06 LAB — HEMOGLOBIN AND HEMATOCRIT, BLOOD
HCT: 32.5 % — ABNORMAL LOW (ref 39.0–52.0)
Hemoglobin: 10.6 g/dL — ABNORMAL LOW (ref 13.0–17.0)

## 2020-01-28 ENCOUNTER — Telehealth: Payer: Self-pay | Admitting: Gastroenterology

## 2020-01-28 NOTE — Telephone Encounter (Signed)
His symptoms are concerning for acute on chronic pancreatitis, will need to come to ER for aggressive IV fluids and pain control. He has known chronic idiopathic pancreatitis possible etiology pancreas divisum.

## 2020-01-28 NOTE — Telephone Encounter (Signed)
Pt states he was told to call back regarding his pancreatitis instead of going to the hospital

## 2020-01-28 NOTE — Telephone Encounter (Signed)
Spoke with the patient. Hx of pancreatitis. He is on Creon. Monday his abdomen began hurting off and on. Tuesday the pain increased a little, but he was not worried. He received his booster shot for COVID. Wednesday the pain woke him up. He changed his diet to clear liquids. Took Tylenol for pain without relief. Pain would come and go. Last night is worsened so he took Ibuprofen. This morning he took 800 mg of Ibuprofen at 6:00 am. That helped. He calls because the abdominal pain is returning. He states this is the pain that begins when he is having an attack of pancreatitis.He has continued clear liquids. No vomiting. No fever of body aches.  He was told to call us instead of presenting to the ED. Please advise.

## 2020-01-29 ENCOUNTER — Encounter (HOSPITAL_COMMUNITY): Payer: Self-pay | Admitting: Family Medicine

## 2020-01-29 ENCOUNTER — Inpatient Hospital Stay (HOSPITAL_COMMUNITY)
Admission: EM | Admit: 2020-01-29 | Discharge: 2020-02-02 | DRG: 439 | Disposition: A | Discharge status: Home / Self Care | Payer: Managed Care, Other (non HMO) | Attending: Internal Medicine | Admitting: Internal Medicine

## 2020-01-29 ENCOUNTER — Other Ambulatory Visit: Payer: Self-pay

## 2020-01-29 ENCOUNTER — Emergency Department (HOSPITAL_COMMUNITY): Payer: Managed Care, Other (non HMO)

## 2020-01-29 DIAGNOSIS — Z8249 Family history of ischemic heart disease and other diseases of the circulatory system: Secondary | ICD-10-CM

## 2020-01-29 DIAGNOSIS — N179 Acute kidney failure, unspecified: Secondary | ICD-10-CM | POA: Diagnosis present

## 2020-01-29 DIAGNOSIS — Z20822 Contact with and (suspected) exposure to covid-19: Secondary | ICD-10-CM | POA: Diagnosis present

## 2020-01-29 DIAGNOSIS — K861 Other chronic pancreatitis: Secondary | ICD-10-CM | POA: Diagnosis present

## 2020-01-29 DIAGNOSIS — G8929 Other chronic pain: Secondary | ICD-10-CM | POA: Diagnosis present

## 2020-01-29 DIAGNOSIS — E860 Dehydration: Secondary | ICD-10-CM | POA: Diagnosis present

## 2020-01-29 DIAGNOSIS — Z800 Family history of malignant neoplasm of digestive organs: Secondary | ICD-10-CM

## 2020-01-29 DIAGNOSIS — G43909 Migraine, unspecified, not intractable, without status migrainosus: Secondary | ICD-10-CM | POA: Diagnosis present

## 2020-01-29 DIAGNOSIS — D631 Anemia in chronic kidney disease: Secondary | ICD-10-CM | POA: Diagnosis present

## 2020-01-29 DIAGNOSIS — Z87442 Personal history of urinary calculi: Secondary | ICD-10-CM

## 2020-01-29 DIAGNOSIS — Q453 Other congenital malformations of pancreas and pancreatic duct: Secondary | ICD-10-CM

## 2020-01-29 DIAGNOSIS — Q600 Renal agenesis, unilateral: Secondary | ICD-10-CM

## 2020-01-29 DIAGNOSIS — Z888 Allergy status to other drugs, medicaments and biological substances status: Secondary | ICD-10-CM | POA: Diagnosis not present

## 2020-01-29 DIAGNOSIS — I129 Hypertensive chronic kidney disease with stage 1 through stage 4 chronic kidney disease, or unspecified chronic kidney disease: Secondary | ICD-10-CM | POA: Diagnosis present

## 2020-01-29 DIAGNOSIS — Z841 Family history of disorders of kidney and ureter: Secondary | ICD-10-CM | POA: Diagnosis not present

## 2020-01-29 DIAGNOSIS — M545 Low back pain, unspecified: Secondary | ICD-10-CM | POA: Diagnosis present

## 2020-01-29 DIAGNOSIS — Z833 Family history of diabetes mellitus: Secondary | ICD-10-CM | POA: Diagnosis not present

## 2020-01-29 DIAGNOSIS — K8500 Idiopathic acute pancreatitis without necrosis or infection: Secondary | ICD-10-CM | POA: Diagnosis not present

## 2020-01-29 DIAGNOSIS — K859 Acute pancreatitis without necrosis or infection, unspecified: Secondary | ICD-10-CM | POA: Diagnosis not present

## 2020-01-29 DIAGNOSIS — E785 Hyperlipidemia, unspecified: Secondary | ICD-10-CM | POA: Diagnosis present

## 2020-01-29 DIAGNOSIS — R1012 Left upper quadrant pain: Secondary | ICD-10-CM | POA: Diagnosis present

## 2020-01-29 DIAGNOSIS — N200 Calculus of kidney: Secondary | ICD-10-CM

## 2020-01-29 DIAGNOSIS — K219 Gastro-esophageal reflux disease without esophagitis: Secondary | ICD-10-CM | POA: Diagnosis present

## 2020-01-29 DIAGNOSIS — E7800 Pure hypercholesterolemia, unspecified: Secondary | ICD-10-CM | POA: Diagnosis present

## 2020-01-29 DIAGNOSIS — N1831 Chronic kidney disease, stage 3a: Secondary | ICD-10-CM | POA: Diagnosis present

## 2020-01-29 DIAGNOSIS — J42 Unspecified chronic bronchitis: Secondary | ICD-10-CM | POA: Diagnosis present

## 2020-01-29 DIAGNOSIS — E876 Hypokalemia: Secondary | ICD-10-CM | POA: Diagnosis not present

## 2020-01-29 DIAGNOSIS — I10 Essential (primary) hypertension: Secondary | ICD-10-CM | POA: Diagnosis not present

## 2020-01-29 LAB — CBC
HCT: 36.9 % — ABNORMAL LOW (ref 39.0–52.0)
Hemoglobin: 11.7 g/dL — ABNORMAL LOW (ref 13.0–17.0)
MCH: 28.1 pg (ref 26.0–34.0)
MCHC: 31.7 g/dL (ref 30.0–36.0)
MCV: 88.5 fL (ref 80.0–100.0)
Platelets: 243 10*3/uL (ref 150–400)
RBC: 4.17 MIL/uL — ABNORMAL LOW (ref 4.22–5.81)
RDW: 18 % — ABNORMAL HIGH (ref 11.5–15.5)
WBC: 5.5 10*3/uL (ref 4.0–10.5)
nRBC: 0 % (ref 0.0–0.2)

## 2020-01-29 LAB — URINALYSIS, ROUTINE W REFLEX MICROSCOPIC
Bilirubin Urine: NEGATIVE
Glucose, UA: NEGATIVE mg/dL
Ketones, ur: NEGATIVE mg/dL
Leukocytes,Ua: NEGATIVE
Nitrite: NEGATIVE
Protein, ur: NEGATIVE mg/dL
RBC / HPF: >50 RBC/hpf — ABNORMAL HIGH (ref 0–5)
Specific Gravity, Urine: 1.017 (ref 1.005–1.030)
pH: 5 (ref 5.0–8.0)

## 2020-01-29 LAB — COMPREHENSIVE METABOLIC PANEL
ALT: 46 U/L — ABNORMAL HIGH (ref 0–44)
AST: 66 U/L — ABNORMAL HIGH (ref 15–41)
Albumin: 3.7 g/dL (ref 3.5–5.0)
Alkaline Phosphatase: 60 U/L (ref 38–126)
Anion gap: 11 (ref 5–15)
BUN: <5 mg/dL — ABNORMAL LOW (ref 6–20)
CO2: 21 mmol/L — ABNORMAL LOW (ref 22–32)
Calcium: 9.4 mg/dL (ref 8.9–10.3)
Chloride: 105 mmol/L (ref 98–111)
Creatinine, Ser: 1.39 mg/dL — ABNORMAL HIGH (ref 0.61–1.24)
GFR, Estimated: 59 mL/min — ABNORMAL LOW (ref >60)
Glucose, Bld: 108 mg/dL — ABNORMAL HIGH (ref 70–99)
Potassium: 3.7 mmol/L (ref 3.5–5.1)
Sodium: 137 mmol/L (ref 135–145)
Total Bilirubin: 0.6 mg/dL (ref 0.3–1.2)
Total Protein: 6.9 g/dL (ref 6.5–8.1)

## 2020-01-29 LAB — RESPIRATORY PANEL BY RT PCR (FLU A&B, COVID)
Influenza A by PCR: NEGATIVE
Influenza B by PCR: NEGATIVE
SARS Coronavirus 2 by RT PCR: NEGATIVE

## 2020-01-29 LAB — LACTATE DEHYDROGENASE: LDH: 206 U/L — ABNORMAL HIGH (ref 98–192)

## 2020-01-29 LAB — LIPASE, BLOOD: Lipase: 297 U/L — ABNORMAL HIGH (ref 11–51)

## 2020-01-29 MED ORDER — SODIUM CHLORIDE 0.9 % IV BOLUS
1000.0000 mL | Freq: Once | INTRAVENOUS | Status: AC
  Administered 2020-01-29: 1000 mL via INTRAVENOUS

## 2020-01-29 MED ORDER — ACETAMINOPHEN 325 MG PO TABS: 650.0000 mg | ORAL_TABLET | Freq: Four times a day (QID) | ORAL | Status: DC | PRN

## 2020-01-29 MED ORDER — ACETAMINOPHEN 650 MG RE SUPP: 650.0000 mg | Freq: Four times a day (QID) | RECTAL | Status: DC | PRN

## 2020-01-29 MED ORDER — ONDANSETRON HCL 4 MG PO TABS: 4.0000 mg | ORAL_TABLET | Freq: Four times a day (QID) | ORAL | Status: DC | PRN

## 2020-01-29 MED ORDER — PROMETHAZINE HCL 25 MG/ML IJ SOLN
25.0000 mg | Freq: Four times a day (QID) | INTRAMUSCULAR | Status: DC | PRN
  Administered 2020-01-29 – 2020-02-02 (×9): 25 mg via INTRAVENOUS
  Filled 2020-01-29 (×9): qty 1

## 2020-01-29 MED ORDER — LACTATED RINGERS IV SOLN: INTRAVENOUS | Status: DC

## 2020-01-29 MED ORDER — HYDROMORPHONE HCL 1 MG/ML IJ SOLN
1.0000 mg | Freq: Once | INTRAMUSCULAR | Status: AC
  Administered 2020-01-29: 1 mg via INTRAVENOUS
  Filled 2020-01-29: qty 1

## 2020-01-29 MED ORDER — HYDRALAZINE HCL 20 MG/ML IJ SOLN: 5.0000 mg | INTRAMUSCULAR | Status: DC | PRN

## 2020-01-29 MED ORDER — FAMOTIDINE 20 MG PO TABS
20.0000 mg | ORAL_TABLET | Freq: Two times a day (BID) | ORAL | Status: DC
  Administered 2020-01-29 – 2020-02-02 (×9): 20 mg via ORAL
  Filled 2020-01-29 (×9): qty 1

## 2020-01-29 MED ORDER — MORPHINE SULFATE (PF) 2 MG/ML IV SOLN
2.0000 mg | INTRAVENOUS | Status: DC | PRN
  Administered 2020-01-29 – 2020-01-30 (×5): 2 mg via INTRAVENOUS
  Filled 2020-01-29 (×5): qty 1

## 2020-01-29 MED ORDER — ONDANSETRON HCL 4 MG/2ML IJ SOLN
4.0000 mg | Freq: Once | INTRAMUSCULAR | Status: AC
  Administered 2020-01-29: 4 mg via INTRAVENOUS
  Filled 2020-01-29: qty 2

## 2020-01-29 MED ORDER — ONDANSETRON HCL 4 MG/2ML IJ SOLN
4.0000 mg | Freq: Four times a day (QID) | INTRAMUSCULAR | Status: DC | PRN
  Administered 2020-01-29: 4 mg via INTRAVENOUS
  Filled 2020-01-29: qty 2

## 2020-01-29 MED ORDER — ENOXAPARIN SODIUM 40 MG/0.4ML ~~LOC~~ SOLN
40.0000 mg | SUBCUTANEOUS | Status: DC
  Administered 2020-01-29 – 2020-02-01 (×4): 40 mg via SUBCUTANEOUS
  Filled 2020-01-29 (×4): qty 0.4

## 2020-01-29 NOTE — H&P (Signed)
History and Physical    Kyle Berry DZH:[id number] DOB: 1971-03-06 DOA: 01/29/2020  PCP: [facility] Consultants:  Nandigam - GI Patient coming from:  Home - lives with girlfriend; NOK: Mother, [phone]  Chief Complaint: Abdominal pain, n/v  HPI: Kyle Berry is a 49 y.o. 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  He reports that he has pancreatitis again.  He wasn't sure if it was his pancreas or his kidneys because the pain comes and goes in the LUQ and radiating to the back.  He felt mild pain last week but it got worse on Monday.  +n/v.  TNTC episodes of pancreatitis.  He has passed 4+ stones in the past.  He is not seeing blood in his urine.  Fever Tuesday, but he got he got his COVID booster that day so he thought that was related.  He got his flu shot yesterday,    ED Course:  Carryover, per Dr. Tonie Griffith:  49 year old gentleman with history of pancreatitis-idiopathic. Presented to emergency room with flare of pancreatitis after calling his GI doctor at [facility] who advised him to come to the emergency room. Being given the fluids and pain medication. CT with no necrosis or fluid collection of pancreas. UA with blood. Has hx of kidney stones. No stones on CT scan today.    Review of Systems: As per HPI; otherwise review of systems reviewed and negative.   Ambulatory Status:  Ambulates without assistance  COVID Vaccine Status:   Complete plus booster  Past Medical History:  Diagnosis Date  . Chronic bronchitis ([place])   . Chronic lower back pain   . Congenital single kidney   . GERD (gastroesophageal reflux disease)   . Headache    "once/month" (05/13/2017)  . High cholesterol   . Hypertension   . Migraine    "a couple/year" (05/13/2017)  . Nephrolithiasis  05/13/2017  . Pneumonia ~ 09/2015  . Recurrent acute pancreatitis     Past Surgical History:  Procedure Laterality Date  . CHOLECYSTECTOMY N/A 10/23/2017   Procedure: LAPAROSCOPIC CHOLECYSTECTOMY WITH INTRAOPERATIVE CHOLANGIOGRAM;  Surgeon: Johnathan Hausen, MD;  Location: [facility];  Service: General;  Laterality: N/A;  . COLONOSCOPY WITH PROPOFOL N/A 11/02/2019   Procedure: COLONOSCOPY WITH PROPOFOL;  Surgeon: Irene Shipper, MD;  Location: [facility] ENDOSCOPY;  Service: Endoscopy;  Laterality: N/A;  . ESOPHAGOGASTRODUODENOSCOPY (EGD) WITH PROPOFOL N/A 11/06/2019   Procedure: ESOPHAGOGASTRODUODENOSCOPY (EGD) WITH PROPOFOL;  Surgeon: Irene Shipper, MD;  Location: [facility] ENDOSCOPY;  Service: Endoscopy;  Laterality: N/A;  . HEMOSTASIS CLIP PLACEMENT  11/02/2019   Procedure: HEMOSTASIS CLIP PLACEMENT;  Surgeon: Irene Shipper, MD;  Location: [facility] ENDOSCOPY;  Service: Endoscopy;;  . NO PAST SURGERIES      Social History   Socioeconomic History  . Marital status: Single    Spouse name: Not on file  . Number of children: Not on file  . Years of education: Not on file  . Highest education level: Not on file  Occupational History  . Occupation: Engineer, building services  Tobacco Use  . Smoking status: Never Smoker  . Smokeless tobacco: Never Used  Vaping Use  . Vaping Use: Never used  Substance and Sexual Activity  . Alcohol use: Not Currently    Comment: 05/13/2017 "might have beer a few times/year"  . Drug use: No  . Sexual activity: Yes  Partners: Female    Birth control/protection: Condom  Other Topics Concern  . Not on file  Social History Narrative  . Not on file   Social Determinants of Health   Financial Resource Strain:   . Difficulty of Paying Living Expenses: Not on file  Food Insecurity:   . Worried About Charity fundraiser in the Last Year: Not on file  . Ran Out of Food in the Last Year: Not on file  Transportation Needs:   . Lack of Transportation (Medical): Not on file  . Lack of  Transportation (Non-Medical): Not on file  Physical Activity:   . Days of Exercise per Week: Not on file  . Minutes of Exercise per Session: Not on file  Stress:   . Feeling of Stress : Not on file  Social Connections:   . Frequency of Communication with Friends and Family: Not on file  . Frequency of Social Gatherings with Friends and Family: Not on file  . Attends Religious Services: Not on file  . Active Member of Clubs or Organizations: Not on file  . Attends Archivist Meetings: Not on file  . Marital Status: Not on file  Intimate Partner Violence:   . Fear of Current or Ex-Partner: Not on file  . Emotionally Abused: Not on file  . Physically Abused: Not on file  . Sexually Abused: Not on file    Allergies  Allergen Reactions  . Diclofenac Hives  . Norvasc [Amlodipine Besylate] Other (See Comments)    Fluid buildup in chest  . Omeprazole Other (See Comments)    MD stopped due to pancreatitis  . Prednisone Other (See Comments)    Mood swings    Family History  Problem Relation Age of Onset  . Hypertension Other   . Hypertension Mother   . Hypertension Father   . Kidney disease Father   . Hypertension Sister   . Diabetes Sister   . Hypertension Brother   . Pancreatic cancer Paternal Grandmother   . Colon cancer Cousin   . Stomach cancer Neg Hx   . Esophageal cancer Neg Hx     Prior to Admission medications   Medication Sig Start Date End Date Taking? Authorizing Provider  AMBULATORY NON FORMULARY MEDICATION Nitroglycerine ointment 0.125 %  Apply a pea sized amount internally three times daily x8 weeks Dispense 30 GM zero refill 12/09/19   Nandigam, Venia Minks, MD  lipase/protease/amylase (CREON) 36000 UNITS CPEP capsule Take 1 capsule (36,000 Units total) by mouth See admin instructions. Take two capsules (36,000 units) by mouth with meals (3 meals daily) and one capsule (36000 units) with snacks daily Patient taking differently: Take 36,000-72,000 Units  by mouth See admin instructions. Take two capsules (36,000 units) by mouth with meals (3 meals daily) and one capsule (36000 units) with snacks daily 09/07/19   Nandigam, Venia Minks, MD  ondansetron (ZOFRAN ODT) 4 MG disintegrating tablet Take 1 tablet (4 mg total) by mouth every 8 (eight) hours as needed for nausea or vomiting. 11/06/19   Jonetta Osgood, MD    Physical Exam: Vitals:   01/29/20 0026 01/29/20 0543 01/29/20 0553 01/29/20 0800  BP: (!) 173/105 (!) 176/94    Pulse: 95 81    Resp: 16 20    Temp: 98.1 F (36.7 C) 98.4 F (36.9 C)    TempSrc: Oral Oral    SpO2: 100% 99% 99%   Weight:    74.4 kg  Height:    6\' 1"  (  1.854 m)     . General:  Appears calm and comfortable and is NAD, appears mildly uncomfortable at rest . Eyes:  PERRL, EOMI, normal lids, iris . ENT:  grossly normal hearing, lips & tongue, mmm; suboptimal dentition . Neck:  no LAD, masses or thyromegaly . Cardiovascular:  RRR, no m/r/g. No LE edema.  Marland Kitchen Respiratory:   CTA bilaterally with no wheezes/rales/rhonchi.  Normal respiratory effort. . Abdomen:  soft, significant LUQ TTP, ND . Back:   normal alignment, ?mild L CVAT . Skin:  no rash or induration seen on limited exam . Musculoskeletal:  grossly normal tone BUE/BLE, good ROM, no bony abnormality . Psychiatric:  grossly normal mood and affect, speech fluent and appropriate, AOx3 . Neurologic:  CN 2-12 grossly intact, moves all extremities in coordinated fashion    Radiological Exams on Admission: CT RENAL STONE STUDY  Result Date: 01/29/2020 CLINICAL DATA:  Flank pain.  Kidney stone suspected. EXAM: CT ABDOMEN AND PELVIS WITHOUT CONTRAST TECHNIQUE: Multidetector CT imaging of the abdomen and pelvis was performed following the standard protocol without IV contrast. COMPARISON:  CT of the abdomen and pelvis 08/20/2019 FINDINGS: Lower chest: The lung bases are clear without focal nodule, mass, or airspace disease. Heart size is normal. Significant pleural  or pericardial effusion is present. Coronary artery calcifications are evident. Hepatobiliary: No focal liver abnormality is seen. Status post cholecystectomy. No biliary dilatation. Pancreas: Calcification present of the pancreas measuring 5 mm. Phlegm a tori changes are noted about the tail. No cyst or mass lesion is present. No duct dilation is present. Spleen: Normal in size without focal abnormality. Adrenals/Urinary Tract: Adrenal glands are normal bilaterally. Chronic right renal atrophy is noted. Two nonobstructing stones lower pole of the left kidney have increased in size, now measuring up to 6 mm. Obstruction is present. Ureter is normal. The urinary bladder is within normal limits. Stomach/Bowel: Stomach is within normal limits. Appendix appears normal. No evidence of bowel wall thickening, distention, or inflammatory changes. Vascular/Lymphatic: No significant vascular findings are present. No enlarged abdominal or pelvic lymph nodes. Reproductive: Uterus and bilateral adnexa are unremarkable. Other: No abdominal wall hernia or abnormality. No abdominopelvic ascites. Musculoskeletal: Vertebral body heights and alignment are maintained. Straightening of the lumbar lordosis is again seen. Degenerative changes are evident at L5-S1. No focal lytic or blastic lesions are present pelvis is within normal limits. Hips are located and normal. IMPRESSION: 1. Inflammatory changes about the tail of the pancreas compatible with acute pancreatitis. No complicating features are present. 2. Increased size of nonobstructing stones lower pole of the left kidney. No obstruction is present. 3. Chronic right renal atrophy. 4. Coronary artery disease. Electronically Signed   By: San Morelle M.D.   On: 01/29/2020 06:20    EKG:  none   Labs on Admission: I have personally reviewed the available labs and imaging studies at the time of the admission.  Pertinent labs:   Glucose 108 BUN <5/Creatinine 1.39/GFR  59 Lipase 297 AST /ALT 46 WBC 5.5 Hgb 11.7 UA: large Hgb, rare bacteria, >50 RBC   Assessment/Plan Principal Problem:   Acute on chronic pancreatitis (HCC) Active Problems:   Essential hypertension   Congenital single kidney   Nephrolithiasis    Acute on chronic pancreatitis -Patient with prior h/o acute recurrent pancreatitis and chronic pancreatitis, thought to be associated with pancreatic divisum -He is followed by GI -He has been in touch with Dr. Silverio Decamp this week regarding worsening symptoms but finally progressed to the point  where he felt he had to come in for evaluation and treatment -Now with frank pancreatitis by H&P, mildly elevated lipase, and CT with inflammation in the pancreatic head -His LDH is pending, but assuming it is <300, his score is 0 with a mortality risk of 0.9%. -Will admit due to failed outpatient management -Strict NPO for now -Aggressive IVF hydration at least for the first 12 hours with LR at 200 cc/hr -Pain control with morphine 2 mg q2h prn.   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 Alyson Ingles, who reports that it is unlikely that these stones are contributing to his pain -However, his stones are likely contributed to by chronic malnutrition and/or inadequate hydration -He would likely benefit from outpatient urology management and should be referred at the time of d/c  HTN -He appears to be not taking medications for this issue at this time -His BP is significantly elevated, but this may be associated with pain -Due to his solitary kidney, it is very important that he maintain good long-term BP control  -Medication may be required  either as inpatient or outpatient    Note: This patient has been tested and is negative for the novel coronavirus COVID-19. The patient has been fully vaccinated against COVID-19.    DVT prophylaxis:  Lovenox Code Status:  Full - confirmed with patient Family Communication: None present Disposition Plan:  The patient is from: home  Anticipated d/c is to: home without [facility] services  Anticipated d/c date will depend on clinical response to treatment, likely several days given recurrent nature of his disease and failure of outpatient management  Patient is currently: acutely ill Consults called: GI; urology by telephone only Admission status:  Admit - It is my clinical opinion that admission to [place] is reasonable and necessary because of the expectation that this patient will require hospital care that crosses at least 2 midnights to treat this condition based on the medical complexity of the problems presented.  Given the aforementioned information, the predictability of an adverse outcome is felt to be significant.    Karmen Bongo MD [facility]   How to contact the [facility] Attending or Consulting provider [place] or covering provider during after hours [place], for this patient?  1. Check the care team in [facility] and look for a) attending/consulting [facility] provider listed and b) the [facility] team listed 2. Log into [email or web address] and use 's universal password to access. If you do not have the password, please contact the hospital operator. 3. Locate the [facility] provider you are looking for under [facility] and page to a number that you can be directly reached. 4. If you still have difficulty reaching the provider, please page the [facility] (Director on Call) for the Hospitalists listed on amion for assistance.   01/29/2020, 9:15 AM

## 2020-01-29 NOTE — ED Notes (Signed)
Patient transported to CT 

## 2020-01-29 NOTE — ED Triage Notes (Signed)
Pt has hx of pancreatitis and has been trying to manage at home with soups and water. Pt said called his PCP and gastro dr but could not wait until tomorrow to see them. Pt said N/V that began worse yesterday.

## 2020-01-29 NOTE — Consult Note (Addendum)
[facility] Consult: 10:34 AM 01/29/2020  LOS: 0 days    Referring Provider: Dr Lorin Mercy  Primary Care Physician:  [name] (Inactive) Primary Gastroenterologist:  Dr. Silverio Decamp Pt cell phone [phone]     Reason for Consultation:  Pancreatitis.     HPI: Kyle Berry is a 49 y.o. male.  PMH below.  Chronic pancreatitis with multiple recurrent episodes acute, idiopathic pancreatitis (IgG 4 normal x 2, no drastic elevation Trigs) (Etoh, ACE inhibitor, pancreatic divisum, biliary origin?).  Lipase 3700 in 1/1216244 10/2018, 345 05/2019, 339 08/2019, 197 June 2021.  No previous pseudocysts.   Lap chole 2019.  CKD 3.   Has never had endoscopic ultrasound or ERCP.    10/2016 MRCP showed acute pancreatitis, suspicion for pancreas divisum, no evidence of chronic pancreatitis. 08/2016 MRI/MRCP raised question of chronic pancreatitis with focally dilated portion of dorsal pancreatic duct and fused mild irregularity of pancreatic duct.  05/27/2019 abdominal ultrasound: mild dilation of PD to 4 mm.  No fluid collections or abdominal free fluid.  Mild biliary ductal prominence normal post cholecystectomy.   CTAP 10/14/2019: Mild inflammatory changes at the pancreatic body and tail suggesting acute pancreatitis.  No pseudocyst.  Stable uncinate process calcification.  Severe, stable right kidney atrophy.  Nonobstructing left renal calculi.   Investigation of N/V, abd pain w:  10/14/19 CTAP w contrast: mild inflammation pancreatic bodyand tail.  No pseudocyst. Stable calcification at uncinate.  Lipase 6/30: 197 10/30/19 colonoscopy. Fair prep.  20 mm cecal polyp hot snared.  30 mm rectomsigmoid polyp hot snared and site clipped.  Smaller polyps removed as well. Diverticulosis and int rrhoids noted. Path on all  polyps: TA w/o HGD.    10/30/19 EGD: normal z line, gastritis (path: mild, chronic gastritis w/o H pylori)   Post polypectomy bleed, admitted 7/18 - 11/02/2019 Hgb 12.8 >> 11.5. 11/02/19 colonoscopy: ulcer and pigmented protuberance at cecum, site clipped x 4. Large polypectomy with pigemented protuberance at sigmoid also clipped x 3. Diverticulosis.   Readmission 7/22 - 11/06/19 w hypotension, hematemesis, 3 gram drop Hgb over 3 days, stable chronic LUQ abd pain Hgb 8.4 >> 1 PRBC >> 8.7.   11/06/19 EGD:  Mild gastropathy.     Recurrent abdominal pain began 10/11.  Intermittent and not severe initially.  Regressed his diet to clear liquids.  Received Covid booster on 11/13.  Pain persisted, no relief with Tylenol.  Last night, 10/14 and again this morning took ibuprofen which helped but the pain returned.  Then developed nonbloody emesis.  No fever.  Contacted Dr. Woodward Ku office and was advised to proceed to the ED so that he could be treated with IV fluids and analgesia.  Patient had try to avoid the ED in hospital as he does not want to miss work.  No EtOH. Feeling better this morning.  Renal stone CT study reveals pancreatitis in the tail of the pancreas.  Increasing size of the nonobstructing left kidney stone and chronic right renal atrophy.  CAD. Lipase 297.  T bili 0.6.  Alk phos  60.  AST/ALT 66/46. Creatinine above baseline at 1.39.   Hgb 11.7.   LR running at 200/h following 1 L bolus of normal saline.    Past Medical History:  Diagnosis Date  . Chronic bronchitis ([place])   . Chronic lower back pain   . Congenital single kidney   . GERD (gastroesophageal reflux disease)   . Headache    "once/month" (05/13/2017)  . High cholesterol   . Hypertension   . Migraine    "a couple/year" (05/13/2017)  . Nephrolithiasis 05/13/2017  . Pneumonia ~ 09/2015  . Recurrent acute pancreatitis     Past Surgical History:  Procedure Laterality Date  . CHOLECYSTECTOMY N/A 10/23/2017   Procedure:  LAPAROSCOPIC CHOLECYSTECTOMY WITH INTRAOPERATIVE CHOLANGIOGRAM;  Surgeon: Johnathan Hausen, MD;  Location: [facility] ORS;  Service: General;  Laterality: N/A;  . COLONOSCOPY WITH PROPOFOL N/A 11/02/2019   Procedure: COLONOSCOPY WITH PROPOFOL;  Surgeon: Irene Shipper, MD;  Location: [facility] ENDOSCOPY;  Service: Endoscopy;  Laterality: N/A;  . ESOPHAGOGASTRODUODENOSCOPY (EGD) WITH PROPOFOL N/A 11/06/2019   Procedure: ESOPHAGOGASTRODUODENOSCOPY (EGD) WITH PROPOFOL;  Surgeon: Irene Shipper, MD;  Location: [facility] ENDOSCOPY;  Service: Endoscopy;  Laterality: N/A;  . HEMOSTASIS CLIP PLACEMENT  11/02/2019   Procedure: HEMOSTASIS CLIP PLACEMENT;  Surgeon: Irene Shipper, MD;  Location: [facility] ENDOSCOPY;  Service: Endoscopy;;  . NO PAST SURGERIES      Prior to Admission medications   Medication Sig Start Date End Date Taking? Authorizing Provider  ibuprofen (ADVIL) 200 MG tablet Take 400 mg by mouth every 6 (six) hours as needed for mild pain.   Yes [provider]  lipase/protease/amylase (CREON) 36000 UNITS CPEP capsule Take 1 capsule (36,000 Units total) by mouth See admin instructions. Take two capsules (36,000 units) by mouth with meals (3 meals daily) and one capsule (36000 units) with snacks daily Patient taking differently: Take 36,000-72,000 Units by mouth See admin instructions. Take two capsules (36,000 units) by mouth with meals (3 meals daily) and one capsule (36000 units) with snacks daily 09/07/19  Yes Nandigam, Venia Minks, MD    Scheduled Meds: . enoxaparin (LOVENOX) injection  40 mg Subcutaneous Q24H   Infusions: . lactated ringers 200 mL/hr at 01/29/20 0912   PRN Meds: acetaminophen **OR** acetaminophen, hydrALAZINE, morphine injection, ondansetron **OR** ondansetron (ZOFRAN) IV   Allergies as of 01/29/2020 - Review Complete 01/29/2020  Allergen Reaction Noted  . Diclofenac Hives 12/14/2013  . Norvasc [amlodipine besylate] Other (See Comments) 08/15/2016  . Omeprazole Other (See Comments)  08/15/2016  . Prednisone Other (See Comments) 12/14/2013    Family History  Problem Relation Age of Onset  . Hypertension Other   . Hypertension Mother   . Hypertension Father   . Kidney disease Father   . Hypertension Sister   . Diabetes Sister   . Hypertension Brother   . Pancreatic cancer Paternal Grandmother   . Colon cancer Cousin   . Stomach cancer Neg Hx   . Esophageal cancer Neg Hx     Social History   Socioeconomic History  . Marital status: Single    Spouse name: Not on file  . Number of children: Not on file  . Years of education: Not on file  . Highest education level: Not on file  Occupational History  . Occupation: Engineer, building services  Tobacco Use  . Smoking status: Never Smoker  . Smokeless tobacco: Never Used  Vaping Use  . Vaping Use: Never used  Substance and Sexual Activity  . Alcohol use: Not Currently  Comment: 05/13/2017 "might have beer a few times/year"  . Drug use: No  . Sexual activity: Yes    Partners: Female    Birth control/protection: Condom  Other Topics Concern  . Not on file  Social History Narrative  . Not on file   Social Determinants of Health   Financial Resource Strain:   . Difficulty of Paying Living Expenses: Not on file  Food Insecurity:   . Worried About Charity fundraiser in the Last Year: Not on file  . Ran Out of Food in the Last Year: Not on file  Transportation Needs:   . Lack of Transportation (Medical): Not on file  . Lack of Transportation (Non-Medical): Not on file  Physical Activity:   . Days of Exercise per Week: Not on file  . Minutes of Exercise per Session: Not on file  Stress:   . Feeling of Stress : Not on file  Social Connections:   . Frequency of Communication with Friends and Family: Not on file  . Frequency of Social Gatherings with Friends and Family: Not on file  . Attends Religious Services: Not on file  . Active Member of Clubs or Organizations: Not on file  . Attends Archivist  Meetings: Not on file  . Marital Status: Not on file  Intimate Partner Violence:   . Fear of Current or Ex-Partner: Not on file  . Emotionally Abused: Not on file  . Physically Abused: Not on file  . Sexually Abused: Not on file    REVIEW OF SYSTEMS: Constitutional:  No weakness, feels tired ENT:  No nose bleeds Pulm:  No SOB or cough CV:  No palpitations, no LE edema. No chest pain GU:  No hematuria, no frequency GI: Per HPI. Heme: No unusual or excessive bleeding or bruising. Transfusions: None. Neuro:  No headaches, no peripheral tingling or numbness.  No seizures, no syncope. Derm:  No itching, no rash or sores.  Endocrine:  No sweats or chills.  No polyuria or dysuria Immunization: Got his Pfizer COVID-19 booster earlier this week.    PHYSICAL EXAM: Vital signs in last 24 hours: Vitals:   01/29/20 0543 01/29/20 0553  BP: (!) 176/94   Pulse: 81   Resp: 20   Temp: 98.4 F (36.9 C)   SpO2: 99% 99%   Wt Readings from Last 3 Encounters:  01/29/20 74.4 kg  12/09/19 74.4 kg  11/06/19 77.1 kg    General: Pleasant, calm, comfortable.  Does not look ill. Head: No facial asymmetry or swelling.  No signs of head trauma. Eyes: No conjunctival pallor.  No scleral icterus. Ears: No hearing deficit Nose: No congestion or discharge. Mouth: Good dentition.  Tongue midline.  Oral mucosa moist, pink, clear. Neck: No JVD, no masses, no thyromegaly. Lungs: Clear bilaterally.  No labored breathing or cough. Heart: RRR.  No MRG.  S1, S2 present Abdomen: Soft, mild to moderate tenderness across the epigastrium and left upper quadrant without guarding or rebound.  Bowel sounds active.  No distention.  No HSM, masses, bruits, hernias. Rectal: Deferred Musc/Skeltl: No joint redness, swelling or gross deformity. Extremities: No CCE. Neurologic: Alert.  Oriented x3.  Moves all 4 limbs.  No gross deficits. Skin: No rash, no sores, no telangiectasia Nodes: No cervical adenopathy Psych:  Cooperative, calm, pleasant.  Intake/Output from previous day: No intake/output data recorded. Intake/Output this shift: Total I/O In: 1000 [IV Piggyback:1000] Out: 750 [Urine:750]  LAB RESULTS: Recent Labs    01/29/20 0101  WBC 5.5  HGB 11.7*  HCT 36.9*  PLT 243   BMET Lab Results  Component Value Date   NA 137 01/29/2020   NA 140 11/06/2019   NA 139 11/05/2019   K 3.7 01/29/2020   K 3.6 11/06/2019   K 4.3 11/05/2019   CL 105 01/29/2020   CL 107 11/06/2019   CL 105 11/05/2019   CO2 21 (L) 01/29/2020   CO2 24 11/06/2019   CO2 26 11/05/2019   GLUCOSE 108 (H) 01/29/2020   GLUCOSE 93 11/06/2019   GLUCOSE 99 11/05/2019   BUN <5 (L) 01/29/2020   BUN 5 (L) 11/06/2019   BUN 7 11/05/2019   CREATININE 1.39 (H) 01/29/2020   CREATININE 1.15 11/06/2019   CREATININE 1.17 11/05/2019   CALCIUM 9.4 01/29/2020   CALCIUM 8.4 (L) 11/06/2019   CALCIUM 8.9 11/05/2019   LFT Recent Labs    01/29/20 0101  PROT 6.9  ALBUMIN 3.7  AST 66*  ALT 46*  ALKPHOS 60  BILITOT 0.6   PT/INR Lab Results  Component Value Date   INR 1.1 11/02/2019   INR 1.11 10/19/2017   Hepatitis Panel No results for input(s): HEPBSAG, HCVAB, HEPAIGM, HEPBIGM in the last 72 hours. C-Diff No components found for: CDIFF Lipase     Component Value Date/Time   LIPASE 297 (H) 01/29/2020 0101    Drugs of Abuse     Component Value Date/Time   LABOPIA POSITIVE (A) 10/12/2017 0300   COCAINSCRNUR NONE DETECTED 10/12/2017 0300   LABBENZ NONE DETECTED 10/12/2017 0300   AMPHETMU NONE DETECTED 10/12/2017 0300   THCU NONE DETECTED 10/12/2017 0300   LABBARB (A) 10/12/2017 0300    Result not available. Reagent lot number recalled by manufacturer.     RADIOLOGY STUDIES: CT RENAL STONE STUDY  Result Date: 01/29/2020 CLINICAL DATA:  Flank pain.  Kidney stone suspected. EXAM: CT ABDOMEN AND PELVIS WITHOUT CONTRAST TECHNIQUE: Multidetector CT imaging of the abdomen and pelvis was performed following the  standard protocol without IV contrast. COMPARISON:  CT of the abdomen and pelvis 08/20/2019 FINDINGS: Lower chest: The lung bases are clear without focal nodule, mass, or airspace disease. Heart size is normal. Significant pleural or pericardial effusion is present. Coronary artery calcifications are evident. Hepatobiliary: No focal liver abnormality is seen. Status post cholecystectomy. No biliary dilatation. Pancreas: Calcification present of the pancreas measuring 5 mm. Phlegm a tori changes are noted about the tail. No cyst or mass lesion is present. No duct dilation is present. Spleen: Normal in size without focal abnormality. Adrenals/Urinary Tract: Adrenal glands are normal bilaterally. Chronic right renal atrophy is noted. Two nonobstructing stones lower pole of the left kidney have increased in size, now measuring up to 6 mm. Obstruction is present. Ureter is normal. The urinary bladder is within normal limits. Stomach/Bowel: Stomach is within normal limits. Appendix appears normal. No evidence of bowel wall thickening, distention, or inflammatory changes. Vascular/Lymphatic: No significant vascular findings are present. No enlarged abdominal or pelvic lymph nodes. Reproductive: Uterus and bilateral adnexa are unremarkable. Other: No abdominal wall hernia or abnormality. No abdominopelvic ascites. Musculoskeletal: Vertebral body heights and alignment are maintained. Straightening of the lumbar lordosis is again seen. Degenerative changes are evident at L5-S1. No focal lytic or blastic lesions are present pelvis is within normal limits. Hips are located and normal. IMPRESSION: 1. Inflammatory changes about the tail of the pancreas compatible with acute pancreatitis. No complicating features are present. 2. Increased size of nonobstructing stones lower pole of the  left kidney. No obstruction is present. 3. Chronic right renal atrophy. 4. Coronary artery disease. Electronically Signed   By: San Morelle M.D.   On: 01/29/2020 06:20     IMPRESSION:   *   Chronic recurrent acute, idiopathic pancreatitis.    *    AKI, CKD.  Chronic right renal atrophy and nonobstructing but enlarging left kidney stone.  Greater than 50 RBCs on urinalysis, and significant WBCs and rare bacteria.   PLAN:     *   Add prn IV Phenergan as this works better for this patient than Zofran  *   Continue aggressive IV fluids, anelegesics, antiemetics.  Recheck labs in the morning.  *    Clear liquids as tolerated  *   Added famotidine 20 mg p.o. bid, has history of reaction to omeprazole.   Azucena Freed  01/29/2020, 10:34 AM Phone [phone]    Attending physician's note   I have taken an interval history, reviewed the chart and examined the patient. I agree with the Advanced Practitioner's note, impression and recommendations.   Chronic recurrent acute pancreatitis, likely idiopathic.  However, he has pancreas divisum and inflammation is more localized in the tail of pancreas. PD on MRCP  Feb 2021 was irregular without any significant duct dil/stones. One small calcification noted on noncontrast CT, likely vascular calcification. No ETOH, S/P lap chole with IOC, No FH/trauma/medications. Nl TG, Ca, IGG4.  Associated CKD/enlarging left kidney stone without hydronephrosis.  Plan: -MRCP in AM.  I have reviewed Dr. Woodward Ku previous clinic note.  She was planning to repeat MRCP.  We will get it done while he is here. -Check CA 19-9 and CEA level. -Trend CBC, LFTs, lipase. -Recommend EUS as outpatient. If any ductal dilatation, may benefit from minor papilla sphincterotomy. -Continue supportive care for now.  Pain control/treat nausea with Phenergan/famotidine/IV fluids. -Clear liquid diet.  Advance to full liquids and then low-fat diet as tolerated. -Resume Creon when able to tolerate diet. -FU with Dr. Silverio Decamp as outpatient.   Carmell Austria, MD [facility]

## 2020-01-29 NOTE — ED Provider Notes (Signed)
[facility] EMERGENCY DEPARTMENT Provider Note   CSN: [id number] Arrival date & time: 01/29/20  0020     History Chief Complaint  Patient presents with  . Abdominal Pain    Kyle Berry is a 49 y.o. male.  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  Patient has been experiencing pain, nausea and vomiting for 1 week.  Pain is in the left upper abdomen radiating into the back.  He has been trying to manage this at home by only taking in clear liquids but he has had worsening of his symptoms tonight.        Past Medical History:  Diagnosis Date  . Acute pancreatitis 11/23/2015  . Chronic bronchitis ([place])   . Chronic lower back pain   . Congenital single kidney   . GERD (gastroesophageal reflux disease)   . Headache    "once/month" (05/13/2017)  . High cholesterol   . History of kidney stones   . Hypertension   . Migraine    "a couple/year" (05/13/2017)  . Nephrolithiasis 05/13/2017  . Pneumonia ~ 09/2015  . Recurrent acute pancreatitis   . Renal disorder     Patient Active Problem List   Diagnosis Date Noted  . Symptomatic anemia 11/05/2019  . Acute lower GI bleeding 11/02/2019  . Near syncope 11/02/2019  . AKI (acute kidney injury) ([place]) 11/02/2019  . Colonic ulcer   . Rectal bleeding 10/14/2019  . Pancreatic insufficiency 05/28/2019  . Recurrent acute pancreatitis 10/17/2018  . Microscopic hematuria 10/17/2018  . Acute pancreatitis 07/23/2018  . Pancreatic divisum   . Hematemesis 10/18/2017  . Recurrent pancreatitis 05/13/2017  . Congenital single kidney 05/13/2017  . High cholesterol 05/13/2017  . Nephrolithiasis 05/13/2017  . Acute on chronic pancreatitis ([place]) 01/17/2017  . Acute recurrent pancreatitis   . Hypokalemia   . Nausea and vomiting   . Essential  hypertension   . Esophageal reflux   . Chronic pancreatitis ([place]) 11/23/2015    Past Surgical History:  Procedure Laterality Date  . CHOLECYSTECTOMY N/A 10/23/2017   Procedure: LAPAROSCOPIC CHOLECYSTECTOMY WITH INTRAOPERATIVE CHOLANGIOGRAM;  Surgeon: Johnathan Hausen, MD;  Location: [facility] ORS;  Service: General;  Laterality: N/A;  . COLONOSCOPY WITH PROPOFOL N/A 11/02/2019   Procedure: COLONOSCOPY WITH PROPOFOL;  Surgeon: Irene Shipper, MD;  Location: [facility] ENDOSCOPY;  Service: Endoscopy;  Laterality: N/A;  . ESOPHAGOGASTRODUODENOSCOPY (EGD) WITH PROPOFOL N/A 11/06/2019   Procedure: ESOPHAGOGASTRODUODENOSCOPY (EGD) WITH PROPOFOL;  Surgeon: Irene Shipper, MD;  Location: [facility] ENDOSCOPY;  Service: Endoscopy;  Laterality: N/A;  . HEMOSTASIS CLIP PLACEMENT  11/02/2019   Procedure: HEMOSTASIS CLIP PLACEMENT;  Surgeon: Irene Shipper, MD;  Location: [facility] ENDOSCOPY;  Service: Endoscopy;;  . NO PAST SURGERIES         Family History  Problem Relation Age of Onset  . Hypertension Other   . Hypertension Mother   . Hypertension Father   . Kidney disease Father   . Hypertension Sister   . Diabetes Sister   . Hypertension Brother   . Pancreatic cancer Paternal Grandmother   . Colon cancer Cousin   . Stomach cancer Neg Hx   . Esophageal cancer Neg Hx     Social History   Tobacco Use  . Smoking status: Never Smoker  . Smokeless tobacco: Never Used  Vaping Use  . Vaping Use: Never used  Substance Use Topics  .  Alcohol use: Not Currently    Comment: 05/13/2017 "might have beer a few times/year"  . Drug use: No    Home Medications Prior to Admission medications   Medication Sig Start Date End Date Taking? Authorizing Provider  AMBULATORY NON FORMULARY MEDICATION Nitroglycerine ointment 0.125 %  Apply a pea sized amount internally three times daily x8 weeks Dispense 30 GM zero refill 12/09/19   Nandigam, Venia Minks, MD  lipase/protease/amylase (CREON) 36000 UNITS CPEP capsule Take 1 capsule (36,000  Units total) by mouth See admin instructions. Take two capsules (36,000 units) by mouth with meals (3 meals daily) and one capsule (36000 units) with snacks daily Patient taking differently: Take 36,000-72,000 Units by mouth See admin instructions. Take two capsules (36,000 units) by mouth with meals (3 meals daily) and one capsule (36000 units) with snacks daily 09/07/19   Nandigam, Venia Minks, MD  ondansetron (ZOFRAN ODT) 4 MG disintegrating tablet Take 1 tablet (4 mg total) by mouth every 8 (eight) hours as needed for nausea or vomiting. 11/06/19   Ghimire, Henreitta Leber, MD    Allergies    Diclofenac, Norvasc [amlodipine besylate], Omeprazole, and Prednisone  Review of Systems   Review of Systems  Gastrointestinal: Positive for abdominal pain.  All other systems reviewed and are negative.   Physical Exam Updated Vital Signs BP (!) 176/94   Pulse 81   Temp 98.4 F (36.9 C) (Oral)   Resp 20   SpO2 99%   Physical Exam Vitals and nursing note reviewed.  Constitutional:      General: He is not in acute distress.    Appearance: Normal appearance. He is well-developed.  HENT:     Head: Normocephalic and atraumatic.     Right Ear: Hearing normal.     Left Ear: Hearing normal.     Nose: Nose normal.  Eyes:     Conjunctiva/sclera: Conjunctivae normal.     Pupils: Pupils are equal, round, and reactive to light.  Cardiovascular:     Rate and Rhythm: Regular rhythm.     Heart sounds: S1 normal and S2 normal. No murmur heard.  No friction rub. No gallop.   Pulmonary:     Effort: Pulmonary effort is normal. No respiratory distress.     Breath sounds: Normal breath sounds.  Chest:     Chest wall: No tenderness.  Abdominal:     General: Bowel sounds are normal.     Palpations: Abdomen is soft.     Tenderness: There is abdominal tenderness in the left upper quadrant. There is no guarding or rebound. Negative signs include Murphy's sign and McBurney's sign.     Hernia: No hernia is  present.  Musculoskeletal:        General: Normal range of motion.     Cervical back: Normal range of motion and neck supple.  Skin:    General: Skin is warm and dry.     Findings: No rash.  Neurological:     Mental Status: He is alert and oriented to person, place, and time.     GCS: GCS eye subscore is 4. GCS verbal subscore is 5. GCS motor subscore is 6.     Cranial Nerves: No cranial nerve deficit.     Sensory: No sensory deficit.     Coordination: Coordination normal.  Psychiatric:        Speech: Speech normal.        Behavior: Behavior normal.        Thought Content: Thought content  normal.     ED Results / Procedures / Treatments   Labs (all labs ordered are listed, but only abnormal results are displayed) Labs Reviewed  LIPASE, BLOOD - Abnormal; Notable for the following components:      Result Value   Lipase 297 (*)    All other components within normal limits  COMPREHENSIVE METABOLIC PANEL - Abnormal; Notable for the following components:   CO2 21 (*)    Glucose, Bld 108 (*)    BUN <5 (*)    Creatinine, Ser 1.39 (*)    AST 66 (*)    ALT 46 (*)    GFR, Estimated 59 (*)    All other components within normal limits  CBC - Abnormal; Notable for the following components:   RBC 4.17 (*)    Hemoglobin 11.7 (*)    HCT 36.9 (*)    RDW 18.0 (*)    All other components within normal limits  URINALYSIS, ROUTINE W REFLEX MICROSCOPIC - Abnormal; Notable for the following components:   APPearance HAZY (*)    Hgb urine dipstick LARGE (*)    RBC / HPF >50 (*)    Bacteria, UA RARE (*)    All other components within normal limits  RESPIRATORY PANEL BY RT PCR (FLU A&B, COVID)    EKG None  Radiology CT RENAL STONE STUDY  Result Date: 01/29/2020 CLINICAL DATA:  Flank pain.  Kidney stone suspected. EXAM: CT ABDOMEN AND PELVIS WITHOUT CONTRAST TECHNIQUE: Multidetector CT imaging of the abdomen and pelvis was performed following the standard protocol without IV contrast.  COMPARISON:  CT of the abdomen and pelvis 08/20/2019 FINDINGS: Lower chest: The lung bases are clear without focal nodule, mass, or airspace disease. Heart size is normal. Significant pleural or pericardial effusion is present. Coronary artery calcifications are evident. Hepatobiliary: No focal liver abnormality is seen. Status post cholecystectomy. No biliary dilatation. Pancreas: Calcification present of the pancreas measuring 5 mm. Phlegm a tori changes are noted about the tail. No cyst or mass lesion is present. No duct dilation is present. Spleen: Normal in size without focal abnormality. Adrenals/Urinary Tract: Adrenal glands are normal bilaterally. Chronic right renal atrophy is noted. Two nonobstructing stones lower pole of the left kidney have increased in size, now measuring up to 6 mm. Obstruction is present. Ureter is normal. The urinary bladder is within normal limits. Stomach/Bowel: Stomach is within normal limits. Appendix appears normal. No evidence of bowel wall thickening, distention, or inflammatory changes. Vascular/Lymphatic: No significant vascular findings are present. No enlarged abdominal or pelvic lymph nodes. Reproductive: Uterus and bilateral adnexa are unremarkable. Other: No abdominal wall hernia or abnormality. No abdominopelvic ascites. Musculoskeletal: Vertebral body heights and alignment are maintained. Straightening of the lumbar lordosis is again seen. Degenerative changes are evident at L5-S1. No focal lytic or blastic lesions are present pelvis is within normal limits. Hips are located and normal. IMPRESSION: 1. Inflammatory changes about the tail of the pancreas compatible with acute pancreatitis. No complicating features are present. 2. Increased size of nonobstructing stones lower pole of the left kidney. No obstruction is present. 3. Chronic right renal atrophy. 4. Coronary artery disease. Electronically Signed   By: San Morelle M.D.   On: 01/29/2020 06:20     Procedures Procedures (including critical care time)  Medications Ordered in ED Medications  sodium chloride 0.9 % bolus 1,000 mL (1,000 mLs Intravenous New Bag/Given 01/29/20 0624)  HYDROmorphone (DILAUDID) injection 1 mg (1 mg Intravenous Given 01/29/20 0622)  ondansetron (  ZOFRAN) injection 4 mg (4 mg Intravenous Given 01/29/20 2263)    ED Course  I have reviewed the triage vital signs and the nursing notes.  Pertinent labs & imaging results that were available during my care of the patient were reviewed by me and considered in my medical decision making (see chart for details).    MDM Rules/Calculators/A&P                          Patient referred to the emergency department for left upper quadrant abdominal pain.  He has a history of chronic pancreatitis.  Patient has been trying to manage this at home by only taking in clear liquids but has had worsening of his symptoms.  Patient reports severe pain at this point.  Work-up did reveal elevated lipase consistent with acute pancreatitis.  Examination reveals tenderness in the left upper quadrant.  He did, however, have blood in his urine.  He reports he does have a history of kidney stones.  Renal stone study performed, no ureterolithiasis seen.  Patient will require hospitalization for further management of acute on chronic pancreatitis.  Final Clinical Impression(s) / ED Diagnoses Final diagnoses:  Idiopathic acute pancreatitis without infection or necrosis    Rx / DC Orders ED Discharge Orders    None       Orpah Greek, MD 01/29/20 [phone]

## 2020-01-29 NOTE — ED Notes (Signed)
Pt medicated for pain. Pt resting in bed. NADN

## 2020-01-30 ENCOUNTER — Inpatient Hospital Stay (HOSPITAL_COMMUNITY): Payer: Managed Care, Other (non HMO)

## 2020-01-30 DIAGNOSIS — I10 Essential (primary) hypertension: Secondary | ICD-10-CM

## 2020-01-30 DIAGNOSIS — Q600 Renal agenesis, unilateral: Secondary | ICD-10-CM

## 2020-01-30 DIAGNOSIS — N200 Calculus of kidney: Secondary | ICD-10-CM

## 2020-01-30 DIAGNOSIS — K859 Acute pancreatitis without necrosis or infection, unspecified: Secondary | ICD-10-CM

## 2020-01-30 LAB — COMPREHENSIVE METABOLIC PANEL
ALT: 37 U/L (ref 0–44)
ALT: 33 U/L (ref 0–44)
AST: 43 U/L — ABNORMAL HIGH (ref 15–41)
AST: 38 U/L (ref 15–41)
Albumin: 2.9 g/dL — ABNORMAL LOW (ref 3.5–5.0)
Albumin: 3.2 g/dL — ABNORMAL LOW (ref 3.5–5.0)
Alkaline Phosphatase: 56 U/L (ref 38–126)
Alkaline Phosphatase: 67 U/L (ref 38–126)
Anion gap: 8 (ref 5–15)
Anion gap: 9 (ref 5–15)
BUN: <5 mg/dL — ABNORMAL LOW (ref 6–20)
BUN: <5 mg/dL — ABNORMAL LOW (ref 6–20)
CO2: 27 mmol/L (ref 22–32)
CO2: 28 mmol/L (ref 22–32)
Calcium: 8.7 mg/dL — ABNORMAL LOW (ref 8.9–10.3)
Calcium: 9 mg/dL (ref 8.9–10.3)
Chloride: 104 mmol/L (ref 98–111)
Chloride: 98 mmol/L (ref 98–111)
Creatinine, Ser: 1.28 mg/dL — ABNORMAL HIGH (ref 0.61–1.24)
Creatinine, Ser: 1.26 mg/dL — ABNORMAL HIGH (ref 0.61–1.24)
GFR, Estimated: >60 mL/min (ref >60)
GFR, Estimated: >60 mL/min (ref >60)
Glucose, Bld: 103 mg/dL — ABNORMAL HIGH (ref 70–99)
Glucose, Bld: 91 mg/dL (ref 70–99)
Potassium: 3.7 mmol/L (ref 3.5–5.1)
Potassium: 3.4 mmol/L — ABNORMAL LOW (ref 3.5–5.1)
Sodium: 139 mmol/L (ref 135–145)
Sodium: 135 mmol/L (ref 135–145)
Total Bilirubin: 1.3 mg/dL — ABNORMAL HIGH (ref 0.3–1.2)
Total Bilirubin: 1 mg/dL (ref 0.3–1.2)
Total Protein: 5.8 g/dL — ABNORMAL LOW (ref 6.5–8.1)
Total Protein: 6.4 g/dL — ABNORMAL LOW (ref 6.5–8.1)

## 2020-01-30 LAB — CBC
HCT: 31.2 % — ABNORMAL LOW (ref 39.0–52.0)
Hemoglobin: 10 g/dL — ABNORMAL LOW (ref 13.0–17.0)
MCH: 28.4 pg (ref 26.0–34.0)
MCHC: 32.1 g/dL (ref 30.0–36.0)
MCV: 88.6 fL (ref 80.0–100.0)
Platelets: 210 10*3/uL (ref 150–400)
RBC: 3.52 MIL/uL — ABNORMAL LOW (ref 4.22–5.81)
RDW: 18.4 % — ABNORMAL HIGH (ref 11.5–15.5)
WBC: 5.5 10*3/uL (ref 4.0–10.5)
nRBC: 0 % (ref 0.0–0.2)

## 2020-01-30 MED ORDER — GADOBUTROL 1 MMOL/ML IV SOLN
7.5000 mL | Freq: Once | INTRAVENOUS | Status: AC | PRN
  Administered 2020-01-30: 7.5 mL via INTRAVENOUS

## 2020-01-30 MED ORDER — HYDROMORPHONE HCL 1 MG/ML IJ SOLN
0.5000 mg | INTRAMUSCULAR | Status: DC | PRN
  Administered 2020-01-30 – 2020-01-31 (×8): 1 mg via INTRAVENOUS
  Filled 2020-01-30 (×8): qty 1

## 2020-01-30 NOTE — Progress Notes (Addendum)
Daily Rounding Note  01/30/2020, 9:58 AM  LOS: 1 day   SUBJECTIVE:   Chief complaint: Pancreatitis  Feeling a lot better this morning.  Pain much improved.  Nausea resolved.  Narcotics switched from morphine to Dilaudid because morphine worsened his nausea.  No emesis.  Currently quite hungry. Waiting on MRI/MRCP  OBJECTIVE:         Vital signs in last 24 hours:    Temp:  [97.7 F (36.5 C)-98.6 F (37 C)] 97.7 F (36.5 C) (10/16 0526) Pulse Rate:  [61-77] 65 (10/16 0526) Resp:  [16-17] 17 (10/16 0526) BP: (131-165)/(88-96) 141/96 (10/16 0526) SpO2:  [97 %-99 %] 98 % (10/16 0526) Last BM Date: 01/29/20 Filed Weights   01/29/20 0800  Weight: 74.4 kg   General: Looks well.  Comfortable. Heart: RRR. Chest: Clear bilaterally without labored breathing.  No cough Abdomen: Soft.  Mild tenderness in the epigastrium and left upper quadrant.  Active bowel sounds.  Not distended Extremities: No CCE. Neuro/Psych: Alert.  Oriented x3.  Moves all 4 limbs.  No tremors or gross deficits.  Pleasant affect.  Intake/Output from previous day: 10/15 0701 - 10/16 0700 In: 3413 [P.O.:978; I.V.:1435; IV Piggyback:1000] Out: 1950 [[id number]]  Intake/Output this shift: No intake/output data recorded.  Lab Results: Recent Labs    01/29/20 0101 01/30/20 0359  WBC 5.5 5.5  HGB 11.7* 10.0*  HCT 36.9* 31.2*  PLT 243 210   BMET Recent Labs    01/29/20 0101 01/30/20 0359  NA 137 139  K 3.7 3.7  CL 105 104  CO2 21* 27  GLUCOSE 108* 103*  BUN <5* <5*  CREATININE 1.39* 1.28*  CALCIUM 9.4 8.7*   LFT Recent Labs    01/29/20 0101 01/30/20 0359  PROT 6.9 5.8*  ALBUMIN 3.7 2.9*  AST 66* 43*  ALT 46* 37  ALKPHOS 60 56  BILITOT 0.6 1.3*   PT/INR No results for input(s): LABPROT, INR in the last 72 hours. Hepatitis Panel No results for input(s): HEPBSAG, HCVAB, HEPAIGM, HEPBIGM in the last 72  hours.  Studies/Results: CT RENAL STONE STUDY  Result Date: 01/29/2020 CLINICAL DATA:  Flank pain.  Kidney stone suspected. EXAM: CT ABDOMEN AND PELVIS WITHOUT CONTRAST TECHNIQUE: Multidetector CT imaging of the abdomen and pelvis was performed following the standard protocol without IV contrast. COMPARISON:  CT of the abdomen and pelvis 08/20/2019 FINDINGS: Lower chest: The lung bases are clear without focal nodule, mass, or airspace disease. Heart size is normal. Significant pleural or pericardial effusion is present. Coronary artery calcifications are evident. Hepatobiliary: No focal liver abnormality is seen. Status post cholecystectomy. No biliary dilatation. Pancreas: Calcification present of the pancreas measuring 5 mm. Phlegm a tori changes are noted about the tail. No cyst or mass lesion is present. No duct dilation is present. Spleen: Normal in size without focal abnormality. Adrenals/Urinary Tract: Adrenal glands are normal bilaterally. Chronic right renal atrophy is noted. Two nonobstructing stones lower pole of the left kidney have increased in size, now measuring up to 6 mm. Obstruction is present. Ureter is normal. The urinary bladder is within normal limits. Stomach/Bowel: Stomach is within normal limits. Appendix appears normal. No evidence of bowel wall thickening, distention, or inflammatory changes. Vascular/Lymphatic: No significant vascular findings are present. No enlarged abdominal or pelvic lymph nodes. Reproductive: Uterus and bilateral adnexa are unremarkable. Other: No abdominal wall hernia or abnormality. No abdominopelvic ascites. Musculoskeletal: Vertebral body heights and alignment are maintained. Straightening  of the lumbar lordosis is again seen. Degenerative changes are evident at L5-S1. No focal lytic or blastic lesions are present pelvis is within normal limits. Hips are located and normal. IMPRESSION: 1. Inflammatory changes about the tail of the pancreas compatible with  acute pancreatitis. No complicating features are present. 2. Increased size of nonobstructing stones lower pole of the left kidney. No obstruction is present. 3. Chronic right renal atrophy. 4. Coronary artery disease. Electronically Signed   By: San Morelle M.D.   On: 01/29/2020 06:20    ASSESMENT:   *   Chronic recurrent acute, idiopathic pancreatitis.  Pancreas divisum anatomy.   LFTs improved but t bili up 0.6 >> 1.3.    *    AKI, CKD.  Improved.  Chronic right renal atrophy and nonobstructing but enlarging left kidney stone.  Greater than 50 RBCs on urinalysis, and significant WBCs and rare bacteria.   *   Anemia.  Chronic, normocytic.  Hgb 11.7 (dehydrated) >> 10.  Was 10.6 on 9/22.     PLAN   *   MRCP ordered, should get done today.    *   CEA and CA 19-9 pending.    *     After MRCP, start low-fat diet.    Kyle Berry  01/30/2020, 9:58 AM Phone [phone]

## 2020-01-30 NOTE — Progress Notes (Signed)
PROGRESS NOTE    Kyle Berry  ZOX:[id number] DOB: 06/26/1970 DOA: 01/29/2020 PCP: Cline Crock (Inactive)   Brief Narrative:  HPI on 01/29/2020 by Dr. Jonah Blue Kyle Berry is a 49 y.o. 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  He reports that he has pancreatitis again.  He wasn't sure if it was his pancreas or his kidneys because the pain comes and goes in the LUQ and radiating to the back.  He felt mild pain last week but it got worse on Monday.  +n/v.  TNTC episodes of pancreatitis.  He has passed 4+ stones in the past.  He is not seeing blood in his urine.  Fever Tuesday, but he got he got his COVID booster that day so he thought that was related.  He got his flu shot yesterday,  Interim history Patient mated with acute on chronic pancreatitis.  Gastroenterology consulted. Assessment & Plan   Acute on chronic pancreatitis -Patient with history of acute recurrent pancreatitis/chronic pancreatitis thought to be associated with pancreatic divisum -Gastroenterology consulted and appreciated. -Pending MRCP -Patient with mildly elevated lipase -CT showed inflammation in the pancreatic head -LDH 206 -Patient without failed outpatient management -Currently n.p.o. -Continue IV fluid -Patient feels IV morphine is not controlling his pain.  We will place him on IV Dilaudid -Continue antiemetics for nausea -CEA and CA 19-9 pending  Solitary kidney with nephrolithiasis/chronic kidney disease, stage II-IIIa -GFR has been fluctuating between stage II and IIIa -He has been diagnosed with a history of nephrolithiasis and on CT scan shows enlarging lower pole stones.  Patient also noted to have large microscopic hematuria -Creatinine is currently stable -Admitting physician discussed with  urology, Dr. Ronne Binning, who felt these stones were unlikely contributing to this patient's pain. -Stones likely contributed by chronic malnutrition and/or inadequate hydration -Patient would benefit from outpatient urology  Essential hypertension -Suspect uncontrolled BP secondary to pain -Patient has not been taking any medications for this -Given that he has a solitary kidney with some component of chronic kidney disease, patient would likely benefit from hypertensive management and control -Will continue to monitor and if needed will start medications  Chronic normocytic anemia -Baseline hemoglobin has ranged from approximately 8-10, currently 10 -Patient did have hemoglobin of 11.7 on admission however the drop likely delusional component given IV fluids -Continue to monitor CBC   DVT Prophylaxis Lovenox  Code Status: Full  Family Communication: None at bedside  Disposition Plan:  Status is: Inpatient  Remains inpatient appropriate because:IV treatments appropriate due to intensity of illness or inability to take PO   Dispo: The patient is from: Home              Anticipated d/c is to: Home              Anticipated d/c date is: 3 days              Patient currently is not medically stable to d/c.   Consultants Gastroenterology  Procedures  None  Antibiotics   Anti-infectives (From admission, onward)   None      Subjective:   Kyle Berry seen and examined today.  Patient continues to have abdominal pain and feels nauseous from morphine use.  Does not feel morphine is controlling his pain adequately.  Denies current vomiting.  Denies current chest pain or shortness of breath, dizziness or headache.  Objective:   Vitals:   01/29/20 1513 01/29/20 1740 01/30/20 0000 01/30/20 0526  BP: (!) 165/90 131/88 (!) 149/88 (!) 141/96  Pulse: 61 77 63 65  Resp: 16 16 16 17   Temp:  98.6 F (37 C) 98.3 F (36.8 C) 97.7 F (36.5 C)  TempSrc:  Oral Oral Oral  SpO2:  99%  97% 98%  Weight:      Height:        Intake/Output Summary (Last 24 hours) at 01/30/2020 1306 Last data filed at 01/30/2020 0630 Gross per 24 hour  Intake 2412.98 ml  Output 1200 ml  Net 1212.98 ml   Filed Weights   01/29/20 0800  Weight: 74.4 kg    Exam  General: Well developed, well nourished, NAD, appears younger than stated age  HEENT: NCAT, mucous membranes moist.   Cardiovascular: S1 S2 auscultated, RRR, no murmur  Respiratory: Clear to auscultation bilaterally with equal chest rise  Abdomen: Soft, RUQ and epigastric TTP nondistended, + bowel sounds  Extremities: warm dry without cyanosis clubbing or edema  Neuro: AAOx3, nonfocal  Psych: Normal affect and demeanor with intact judgement and insight   Data Reviewed: I have personally reviewed following labs and imaging studies  CBC: Recent Labs  Lab 01/29/20 0101 01/30/20 0359  WBC 5.5 5.5  HGB 11.7* 10.0*  HCT 36.9* 31.2*  MCV 88.5 88.6  PLT 243 210   Basic Metabolic Panel: Recent Labs  Lab 01/29/20 0101 01/30/20 0359  NA 137 139  K 3.7 3.7  CL 105 104  CO2 21* 27  GLUCOSE 108* 103*  BUN <5* <5*  CREATININE 1.39* 1.28*  CALCIUM 9.4 8.7*   GFR: Estimated Creatinine Clearance: 72.7 mL/min (A) (by C-G formula based on SCr of 1.28 mg/dL (H)). Liver Function Tests: Recent Labs  Lab 01/29/20 0101 01/30/20 0359  AST 66* 43*  ALT 46* 37  ALKPHOS 60 56  BILITOT 0.6 1.3*  PROT 6.9 5.8*  ALBUMIN 3.7 2.9*   Recent Labs  Lab 01/29/20 0101  LIPASE 297*   No results for input(s): AMMONIA in the last 168 hours. Coagulation Profile: No results for input(s): INR, PROTIME in the last 168 hours. Cardiac Enzymes: No results for input(s): CKTOTAL, CKMB, CKMBINDEX, TROPONINI in the last 168 hours. BNP (last 3 results) No results for input(s): PROBNP in the last 8760 hours. HbA1C: No results for input(s): HGBA1C in the last 72 hours. CBG: No results for input(s): GLUCAP in the last 168  hours. Lipid Profile: No results for input(s): CHOL, HDL, LDLCALC, TRIG, CHOLHDL, LDLDIRECT in the last 72 hours. Thyroid Function Tests: No results for input(s): TSH, T4TOTAL, FREET4, T3FREE, THYROIDAB in the last 72 hours. Anemia Panel: No results for input(s): VITAMINB12, FOLATE, FERRITIN, TIBC, IRON, RETICCTPCT in the last 72 hours. Urine analysis:    Component Value Date/Time   COLORURINE YELLOW 01/29/2020 0347   APPEARANCEUR HAZY (A) 01/29/2020 0347   LABSPEC 1.017 01/29/2020 0347   PHURINE 5.0 01/29/2020 0347   GLUCOSEU NEGATIVE 01/29/2020 0347   HGBUR LARGE (A) 01/29/2020 0347   BILIRUBINUR NEGATIVE 01/29/2020 0347   KETONESUR NEGATIVE 01/29/2020 0347   PROTEINUR NEGATIVE 01/29/2020 0347   UROBILINOGEN 0.2 01/20/2015 1643   NITRITE NEGATIVE 01/29/2020 0347   LEUKOCYTESUR NEGATIVE 01/29/2020 0347   Sepsis Labs: @LABRCNTIP (procalcitonin:4,lacticidven:4)  ) Recent Results (from the past 240 hour(s))  Respiratory Panel by RT PCR (Flu A&B, Covid) - Nasopharyngeal Swab     Status: None   Collection Time: 01/29/20  7:21 AM  Specimen: Nasopharyngeal Swab  Result Value Ref Range Status   SARS Coronavirus 2 by RT PCR NEGATIVE NEGATIVE Final    Comment: (NOTE) SARS-CoV-2 target nucleic acids are NOT DETECTED.  The SARS-CoV-2 RNA is generally detectable in upper respiratoy specimens during the acute phase of infection. The lowest concentration of SARS-CoV-2 viral copies this assay can detect is 131 copies/mL. A negative result does not preclude SARS-Cov-2 infection and should not be used as the sole basis for treatment or other patient management decisions. A negative result may occur with  improper specimen collection/handling, submission of specimen other than nasopharyngeal swab, presence of viral mutation(s) within the areas targeted by this assay, and inadequate number of viral copies (<131 copies/mL). A negative result must be combined with clinical observations,  patient history, and epidemiological information. The expected result is Negative.  Fact Sheet for Patients:  https://www.moore.com/  Fact Sheet for Healthcare Providers:  https://www.young.biz/  This test is no t yet approved or cleared by the [place] FDA and  has been authorized for detection and/or diagnosis of SARS-CoV-2 by FDA under an Emergency Use Authorization (EUA). This EUA will remain  in effect (meaning this test can be used) for the duration of the COVID-19 declaration under Section 564(b)(1) of the Act, 21 U.S.C. section 360bbb-3(b)(1), unless the authorization is terminated or revoked sooner.     Influenza A by PCR NEGATIVE NEGATIVE Final   Influenza B by PCR NEGATIVE NEGATIVE Final    Comment: (NOTE) The Xpert Xpress SARS-CoV-2/FLU/RSV assay is intended as an aid in  the diagnosis of influenza from Nasopharyngeal swab specimens and  should not be used as a sole basis for treatment. Nasal washings and  aspirates are unacceptable for Xpert Xpress SARS-CoV-2/FLU/RSV  testing.  Fact Sheet for Patients: https://www.moore.com/  Fact Sheet for Healthcare Providers: https://www.young.biz/  This test is not yet approved or cleared by the [place] FDA and  has been authorized for detection and/or diagnosis of SARS-CoV-2 by  FDA under an Emergency Use Authorization (EUA). This EUA will remain  in effect (meaning this test can be used) for the duration of the  Covid-19 declaration under Section 564(b)(1) of the Act, 21  U.S.C. section 360bbb-3(b)(1), unless the authorization is  terminated or revoked. Performed at [facility], [address] [place], [place] [address]       Radiology Studies: CT RENAL STONE STUDY  Result Date: 01/29/2020 CLINICAL DATA:  Flank pain.  Kidney stone suspected. EXAM: CT ABDOMEN AND PELVIS WITHOUT CONTRAST TECHNIQUE: Multidetector CT imaging  of the abdomen and pelvis was performed following the standard protocol without IV contrast. COMPARISON:  CT of the abdomen and pelvis 08/20/2019 FINDINGS: Lower chest: The lung bases are clear without focal nodule, mass, or airspace disease. Heart size is normal. Significant pleural or pericardial effusion is present. Coronary artery calcifications are evident. Hepatobiliary: No focal liver abnormality is seen. Status post cholecystectomy. No biliary dilatation. Pancreas: Calcification present of the pancreas measuring 5 mm. Phlegm a tori changes are noted about the tail. No cyst or mass lesion is present. No duct dilation is present. Spleen: Normal in size without focal abnormality. Adrenals/Urinary Tract: Adrenal glands are normal bilaterally. Chronic right renal atrophy is noted. Two nonobstructing stones lower pole of the left kidney have increased in size, now measuring up to 6 mm. Obstruction is present. Ureter is normal. The urinary bladder is within normal limits. Stomach/Bowel: Stomach is within normal limits. Appendix appears normal. No evidence of bowel wall  thickening, distention, or inflammatory changes. Vascular/Lymphatic: No significant vascular findings are present. No enlarged abdominal or pelvic lymph nodes. Reproductive: Uterus and bilateral adnexa are unremarkable. Other: No abdominal wall hernia or abnormality. No abdominopelvic ascites. Musculoskeletal: Vertebral body heights and alignment are maintained. Straightening of the lumbar lordosis is again seen. Degenerative changes are evident at L5-S1. No focal lytic or blastic lesions are present pelvis is within normal limits. Hips are located and normal. IMPRESSION: 1. Inflammatory changes about the tail of the pancreas compatible with acute pancreatitis. No complicating features are present. 2. Increased size of nonobstructing stones lower pole of the left kidney. No obstruction is present. 3. Chronic right renal atrophy. 4. Coronary artery  disease. Electronically Signed   By: Marin Roberts M.D.   On: 01/29/2020 06:20     Scheduled Meds: . enoxaparin (LOVENOX) injection  40 mg Subcutaneous Q24H  . famotidine  20 mg Oral BID   Continuous Infusions: . lactated ringers 200 mL/hr at 01/30/20 0623     LOS: 1 day   Time Spent in minutes   45 minutes  Pamula Luther D.O. on 01/30/2020 at 1:06 PM  Between 7am to 7pm - Please see pager noted on [email or web address]  After 7pm go to [email or web address]  And look for the night coverage person covering for me after hours  [facility] Office  [phone]

## 2020-01-31 DIAGNOSIS — Q453 Other congenital malformations of pancreas and pancreatic duct: Secondary | ICD-10-CM | POA: Diagnosis not present

## 2020-01-31 DIAGNOSIS — K859 Acute pancreatitis without necrosis or infection, unspecified: Secondary | ICD-10-CM | POA: Diagnosis not present

## 2020-01-31 LAB — COMPREHENSIVE METABOLIC PANEL
ALT: 30 U/L (ref 0–44)
AST: 29 U/L (ref 15–41)
Albumin: 3.1 g/dL — ABNORMAL LOW (ref 3.5–5.0)
Alkaline Phosphatase: 60 U/L (ref 38–126)
Anion gap: 7 (ref 5–15)
BUN: <5 mg/dL — ABNORMAL LOW (ref 6–20)
CO2: 32 mmol/L (ref 22–32)
Calcium: 9.1 mg/dL (ref 8.9–10.3)
Chloride: 96 mmol/L — ABNORMAL LOW (ref 98–111)
Creatinine, Ser: 1.25 mg/dL — ABNORMAL HIGH (ref 0.61–1.24)
GFR, Estimated: >60 mL/min (ref >60)
Glucose, Bld: 90 mg/dL (ref 70–99)
Potassium: 3.6 mmol/L (ref 3.5–5.1)
Sodium: 135 mmol/L (ref 135–145)
Total Bilirubin: 0.7 mg/dL (ref 0.3–1.2)
Total Protein: 6.4 g/dL — ABNORMAL LOW (ref 6.5–8.1)

## 2020-01-31 LAB — LIPASE, BLOOD: Lipase: 74 U/L — ABNORMAL HIGH (ref 11–51)

## 2020-01-31 MED ORDER — PANCRELIPASE (LIP-PROT-AMYL) 36000-114000 UNITS PO CPEP
72000.0000 [IU] | ORAL_CAPSULE | Freq: Three times a day (TID) | ORAL | Status: DC
  Administered 2020-01-31 – 2020-02-02 (×5): 72000 [IU] via ORAL
  Filled 2020-01-31 (×6): qty 2

## 2020-01-31 MED ORDER — HYDROMORPHONE HCL 1 MG/ML IJ SOLN
0.5000 mg | INTRAMUSCULAR | Status: DC | PRN
  Administered 2020-01-31 – 2020-02-01 (×6): 1 mg via INTRAVENOUS
  Filled 2020-01-31 (×6): qty 1

## 2020-01-31 NOTE — Progress Notes (Signed)
Daily Rounding Note  01/31/2020, 10:49 AM  LOS: 2 days   SUBJECTIVE:   Chief complaint: Acute on chronic pancreatitis.    Abdominal pain continues in a waxing, waning pattern.  Some nausea, no emesis.  No BM. + Malaise Continues on LR at 200 mL/h.  OBJECTIVE:         Vital signs in last 24 hours:    Temp:  [98.2 F (36.8 C)-98.5 F (36.9 C)] 98.5 F (36.9 C) (10/17 0942) Pulse Rate:  [63-81] 67 (10/17 0942) Resp:  [17-18] 18 (10/17 0942) BP: (142-155)/(91-101) 142/101 (10/17 0942) SpO2:  [93 %-99 %] 99 % (10/17 0942) Last BM Date: 01/29/20 Filed Weights   01/29/20 0800  Weight: 74.4 kg   General: Pleasant as always.  Currently comfortable.  Looks moderately ill. Heart: RRR. Chest: Clear bilaterally.  No labored breathing. Abdomen: Tenderness without guarding focused in the left upper quadrant towards the epigastrium.  Bowel sounds active.  Soft. Extremities: No CCE. Neuro/Psych: Alert.  Oriented x3.  Cooperative.  Fluid speech.  Affect a bit flat.  Intake/Output from previous day: No intake/output data recorded.  Intake/Output this shift: Total I/O In: 993 [P.O.:240; I.V.:753] Out: -   Lab Results: Recent Labs    01/29/20 0101 01/30/20 0359  WBC 5.5 5.5  HGB 11.7* 10.0*  HCT 36.9* 31.2*  PLT 243 210   BMET Recent Labs    01/30/20 0359 01/30/20 1734 01/31/20 0903  NA 139 135 135  K 3.7 3.4* 3.6  CL 104 98 96*  CO2 27 28 32  GLUCOSE 103* 91 90  BUN <5* <5* <5*  CREATININE 1.28* 1.26* 1.25*  CALCIUM 8.7* 9.0 9.1   LFT Recent Labs    01/30/20 0359 01/30/20 1734 01/31/20 0903  PROT 5.8* 6.4* 6.4*  ALBUMIN 2.9* 3.2* 3.1*  AST 43* 38 29  ALT 37 33 30  ALKPHOS 56 67 60  BILITOT 1.3* 1.0 0.7   PT/INR No results for input(s): LABPROT, INR in the last 72 hours. Hepatitis Panel No results for input(s): HEPBSAG, HCVAB, HEPAIGM, HEPBIGM in the last 72 hours.  Studies/Results: MR  ABDOMEN MRCP W WO CONTAST  Result Date: 01/30/2020 CLINICAL DATA:  Recurrent abdominal pain.  Pancreatitis. EXAM: MRI ABDOMEN WITHOUT AND WITH CONTRAST (INCLUDING MRCP) TECHNIQUE: Multiplanar multisequence MR imaging of the abdomen was performed both before and after the administration of intravenous contrast. Heavily T2-weighted images of the biliary and pancreatic ducts were obtained, and three-dimensional MRCP images were rendered by post processing. CONTRAST:  7.71mL GADAVIST GADOBUTROL 1 MMOL/ML IV SOLN COMPARISON:  CT abdomen 09/17/2019 and MRI abdomen 06/01/2019 FINDINGS: Despite efforts by the technologist and patient, motion artifact is present on today's exam and could not be eliminated. This reduces exam sensitivity and specificity. Lower chest: Mild atelectasis in both lower lobes. Hepatobiliary: The common bile duct measures up to 9 mm, although appears to taper taper distally in a conical fashion. I do not see a clearly defined filling defect. Some of this may be a physiologic response to cholecystectomy. Several tiny hepatic cysts are present. Pancreas: Portions of the dorsal pancreatic duct are borderline dilated at 0.3 cm. Pancreas divisum noted. No well-defined pancreatic mass. There is stranding adjacent to the tail of the pancreas and tracking along the left paracolic gutter and below the spleen, presumably this may be from pancreatitis particularly in light of the elevated lipase levels. Spleen:  Unremarkable Adrenals/Urinary Tract: There is only a band of  nonfunctional tissue in the expected location of the right kidney. Compensatory hypertrophy of the left kidney. Adrenal glands unremarkable. Stomach/Bowel: Unremarkable Vascular/Lymphatic:  Unremarkable Other:  No supplemental non-categorized findings. Musculoskeletal: Unremarkable IMPRESSION: 1. Acute pancreatitis with stranding adjacent to the tail of the pancreas and tracking along the left paracolic gutter and below the spleen. 2.  Pancreas divisum noted. 3. Mild extrahepatic biliary ductal dilatation, with the distal common bile duct measuring up to 9 mm in diameter. This appears to taper distally in a conical fashion. Some of this may be a physiologic response to cholecystectomy. No choledocholithiasis is identified. 4. Several tiny hepatic cysts. 5. Compensatory hypertrophy of the left kidney. 6. Despite efforts by the technologist and patient, motion artifact is present on today's exam and could not be eliminated. This reduces exam sensitivity and specificity. Electronically Signed   By: Van Clines M.D.   On: 01/30/2020 14:00    ASSESMENT:   *    Acute on chronic idiopathic pancreatitis.  Pancreas divisum anatomy. MRI abdomen/MRCP: Acute pancreatitis with stranding at the tail of the pancreas tracking to left paracolic gutter below the spleen.  Pancreas disease him.  Mild CBD dilatation measuring up to 9 mm which tapers distally in conical fashion.  Some of this dilatation may be physiologic postcholecystectomy response.  No choledocholithiasis.  Several tiny hepatic cysts.  Compensatory left kidney hypertrophy.  Some motion artifact reducing exam sensitivity and specificity. Lipase 297 >> 74.  Mild elevation T bili and AST all resolved. Abdominal pain continues to wax and wane along with nausea.   *    AKI, CKD.  Improved.  *    Anemia.  Chronic, normocytic.    PLAN   *    CEA and CA 19-9 are processing.  *    Continue supportive care.  He is tolerating the high rate of IV fluids so we will leave these in place.      Azucena Freed  01/31/2020, 10:49 AM Phone [phone]

## 2020-01-31 NOTE — Progress Notes (Signed)
PROGRESS NOTE    Kyle Berry  WGN:[id number] DOB: April 23, 1970 DOA: 01/29/2020 PCP: Cline Crock (Inactive)   Brief Narrative:  HPI on 01/29/2020 by Dr. Jonah Blue Kyle Berry is a 49 y.o. 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  He reports that he has pancreatitis again.  He wasn't sure if it was his pancreas or his kidneys because the pain comes and goes in the LUQ and radiating to the back.  He felt mild pain last week but it got worse on Monday.  +n/v.  TNTC episodes of pancreatitis.  He has passed 4+ stones in the past.  He is not seeing blood in his urine.  Fever Tuesday, but he got he got his COVID booster that day so he thought that was related.  He got his flu shot yesterday,  Interim history Patient mated with acute on chronic pancreatitis.  Gastroenterology consulted. Assessment & Plan   Acute on chronic pancreatitis -Patient with history of acute recurrent pancreatitis/chronic pancreatitis thought to be associated with pancreatic divisum -Gastroenterology consulted and appreciated. -Patient with mildly elevated lipase- down to 74 today -CT showed inflammation in the pancreatic head -LDH 206 -Patient without failed outpatient management -MRI/MRCP showed acute pancreatitis with stranding adjacent to the tail of the pancreas and tracking along the left paracolic gutter. Mild extrahepatic biliary ductal dilatation, distal common bile duct measuring up to 9 mm in diameter. Several tiny hepatic cysts. -Patient feels IV morphine is not controlling his pain and was placed on IV Dilaudid. Discussed possibly transitioning him to oral medication, patient is extremely hesitant, and states that it has caused him to have further abdominal pain in the past -Continue antiemetics for  nausea -Will discontinue IV fluids as patient is tolerating oral intake -CEA and CA 19-9 pending -Patient was placed on heart healthy diet by gastroenterology. He has tolerated up some, continues to feel some nausea and pain with his diet. -Further gastroenterology recommendations  Elevated LFTs -Likely secondary to the above, currently trending downward  Solitary kidney with nephrolithiasis/chronic kidney disease, stage II-IIIa -GFR has been fluctuating between stage II and IIIa -He has been diagnosed with a history of nephrolithiasis and on CT scan shows enlarging lower pole stones.  Patient also noted to have large microscopic hematuria -Creatinine is currently stable -Admitting physician discussed with urology, Dr. Ronne Binning, who felt these stones were unlikely contributing to this patient's pain. -Stones likely contributed by chronic malnutrition and/or inadequate hydration -Patient would benefit from outpatient urology  Essential hypertension -Suspect uncontrolled BP secondary to pain -Patient has not been taking any medications for this -Given that he has a solitary kidney with some component of chronic kidney disease, patient would likely benefit from hypertensive management and control -Continue to monitor and if needed will start medications  Chronic normocytic anemia -Baseline hemoglobin has ranged from approximately 8-10, currently 10 -Patient did have hemoglobin of 11.7 on admission however the drop likely dilutional component given IV fluids -discontinued IV fluids -Continue to monitor CBC  Hypokalemia -Resolved, continue to monitor BMP  DVT Prophylaxis Lovenox  Code Status: Full  Family Communication: None at bedside  Disposition Plan:  Status is: Inpatient  Remains inpatient appropriate because:IV treatments appropriate due to intensity of illness or inability to take PO   Dispo: The patient is from: Home  Anticipated d/c is to: Home               Anticipated d/c date is: 1-2 days              Patient currently is not medically stable to d/c.   Consultants Gastroenterology  Procedures  None  Antibiotics   Anti-infectives (From admission, onward)   None      Subjective:   Kyle Berry seen and examined today. Continues to have some abdominal pain and nausea after eating. Denies current chest pain or shortness of breath, dizziness or headache.   Objective:   Vitals:   01/30/20 1746 01/31/20 0004 01/31/20 0537 01/31/20 0942  BP: (!) 155/99 (!) 142/91 (!) 142/93 (!) 142/101  Pulse: 81 76 63 67  Resp: 17 18 18 18   Temp: 98.3 F (36.8 C) 98.5 F (36.9 C) 98.2 F (36.8 C) 98.5 F (36.9 C)  TempSrc: Oral Oral Oral Oral  SpO2: 93% 97% 97% 99%  Weight:      Height:        Intake/Output Summary (Last 24 hours) at 01/31/2020 1314 Last data filed at 01/31/2020 0951 Gross per 24 hour  Intake 992.98 ml  Output --  Net 992.98 ml   Filed Weights   01/29/20 0800  Weight: 74.4 kg   Exam  General: Well developed, well nourished, NAD, appears younger than stated age  HEENT: NCAT,  mucous membranes moist.   Cardiovascular: S1 S2 auscultated, RRR  Respiratory: Clear to auscultation bilaterally   Abdomen: Soft, RUQ/Epigastric TTP, nondistended, + bowel sounds  Extremities: warm dry without cyanosis clubbing or edema  Neuro: AAOx3, nonfocal  Psych: appropriate mood and affect   Data Reviewed: I have personally reviewed following labs and imaging studies  CBC: Recent Labs  Lab 01/29/20 0101 01/30/20 0359  WBC 5.5 5.5  HGB 11.7* 10.0*  HCT 36.9* 31.2*  MCV 88.5 88.6  PLT 243 210   Basic Metabolic Panel: Recent Labs  Lab 01/29/20 0101 01/30/20 0359 01/30/20 1734 01/31/20 0903  NA 137 139 135 135  K 3.7 3.7 3.4* 3.6  CL 105 104 98 96*  CO2 21* 27 28 32  GLUCOSE 108* 103* 91 90  BUN <5* <5* <5* <5*  CREATININE 1.39* 1.28* 1.26* 1.25*  CALCIUM 9.4 8.7* 9.0 9.1   GFR: Estimated Creatinine  Clearance: 74.4 mL/min (A) (by C-G formula based on SCr of 1.25 mg/dL (H)). Liver Function Tests: Recent Labs  Lab 01/29/20 0101 01/30/20 0359 01/30/20 1734 01/31/20 0903  AST 66* 43* 38 29  ALT 46* 37 33 30  ALKPHOS 60 56 67 60  BILITOT 0.6 1.3* 1.0 0.7  PROT 6.9 5.8* 6.4* 6.4*  ALBUMIN 3.7 2.9* 3.2* 3.1*   Recent Labs  Lab 01/29/20 0101 01/31/20 0903  LIPASE 297* 74*   No results for input(s): AMMONIA in the last 168 hours. Coagulation Profile: No results for input(s): INR, PROTIME in the last 168 hours. Cardiac Enzymes: No results for input(s): CKTOTAL, CKMB, CKMBINDEX, TROPONINI in the last 168 hours. BNP (last 3 results) No results for input(s): PROBNP in the last 8760 hours. HbA1C: No results for input(s): HGBA1C in the last 72 hours. CBG: No results for input(s): GLUCAP in the last 168 hours. Lipid Profile: No results for input(s): CHOL, HDL, LDLCALC, TRIG, CHOLHDL, LDLDIRECT in the last 72 hours. Thyroid Function Tests: No results for input(s): TSH, T4TOTAL, FREET4, T3FREE, THYROIDAB in the last 72 hours. Anemia Panel: No results for input(s): VITAMINB12, FOLATE, FERRITIN,  TIBC, IRON, RETICCTPCT in the last 72 hours. Urine analysis:    Component Value Date/Time   COLORURINE YELLOW 01/29/2020 0347   APPEARANCEUR HAZY (A) 01/29/2020 0347   LABSPEC 1.017 01/29/2020 0347   PHURINE 5.0 01/29/2020 0347   GLUCOSEU NEGATIVE 01/29/2020 0347   HGBUR LARGE (A) 01/29/2020 0347   BILIRUBINUR NEGATIVE 01/29/2020 0347   KETONESUR NEGATIVE 01/29/2020 0347   PROTEINUR NEGATIVE 01/29/2020 0347   UROBILINOGEN 0.2 01/20/2015 1643   NITRITE NEGATIVE 01/29/2020 0347   LEUKOCYTESUR NEGATIVE 01/29/2020 0347   Sepsis Labs: @LABRCNTIP (procalcitonin:4,lacticidven:4)  ) Recent Results (from the past 240 hour(s))  Respiratory Panel by RT PCR (Flu A&B, Covid) - Nasopharyngeal Swab     Status: None   Collection Time: 01/29/20  7:21 AM   Specimen: Nasopharyngeal Swab  Result  Value Ref Range Status   SARS Coronavirus 2 by RT PCR NEGATIVE NEGATIVE Final    Comment: (NOTE) SARS-CoV-2 target nucleic acids are NOT DETECTED.  The SARS-CoV-2 RNA is generally detectable in upper respiratoy specimens during the acute phase of infection. The lowest concentration of SARS-CoV-2 viral copies this assay can detect is 131 copies/mL. A negative result does not preclude SARS-Cov-2 infection and should not be used as the sole basis for treatment or other patient management decisions. A negative result may occur with  improper specimen collection/handling, submission of specimen other than nasopharyngeal swab, presence of viral mutation(s) within the areas targeted by this assay, and inadequate number of viral copies (<131 copies/mL). A negative result must be combined with clinical observations, patient history, and epidemiological information. The expected result is Negative.  Fact Sheet for Patients:  https://www.moore.com/  Fact Sheet for Healthcare Providers:  https://www.young.biz/  This test is no t yet approved or cleared by the [place] FDA and  has been authorized for detection and/or diagnosis of SARS-CoV-2 by FDA under an Emergency Use Authorization (EUA). This EUA will remain  in effect (meaning this test can be used) for the duration of the COVID-19 declaration under Section 564(b)(1) of the Act, 21 U.S.C. section 360bbb-3(b)(1), unless the authorization is terminated or revoked sooner.     Influenza A by PCR NEGATIVE NEGATIVE Final   Influenza B by PCR NEGATIVE NEGATIVE Final    Comment: (NOTE) The Xpert Xpress SARS-CoV-2/FLU/RSV assay is intended as an aid in  the diagnosis of influenza from Nasopharyngeal swab specimens and  should not be used as a sole basis for treatment. Nasal washings and  aspirates are unacceptable for Xpert Xpress SARS-CoV-2/FLU/RSV  testing.  Fact Sheet for  Patients: https://www.moore.com/  Fact Sheet for Healthcare Providers: https://www.young.biz/  This test is not yet approved or cleared by the [place] FDA and  has been authorized for detection and/or diagnosis of SARS-CoV-2 by  FDA under an Emergency Use Authorization (EUA). This EUA will remain  in effect (meaning this test can be used) for the duration of the  Covid-19 declaration under Section 564(b)(1) of the Act, 21  U.S.C. section 360bbb-3(b)(1), unless the authorization is  terminated or revoked. Performed at [facility], [address], [place], [place] [address]       Radiology Studies: MR ABDOMEN MRCP W WO CONTAST  Result Date: 01/30/2020 CLINICAL DATA:  Recurrent abdominal pain.  Pancreatitis. EXAM: MRI ABDOMEN WITHOUT AND WITH CONTRAST (INCLUDING MRCP) TECHNIQUE: Multiplanar multisequence MR imaging of the abdomen was performed both before and after the administration of intravenous contrast. Heavily T2-weighted images of the biliary and pancreatic ducts were obtained, and three-dimensional  MRCP images were rendered by post processing. CONTRAST:  7.59mL GADAVIST GADOBUTROL 1 MMOL/ML IV SOLN COMPARISON:  CT abdomen 09/17/2019 and MRI abdomen 06/01/2019 FINDINGS: Despite efforts by the technologist and patient, motion artifact is present on today's exam and could not be eliminated. This reduces exam sensitivity and specificity. Lower chest: Mild atelectasis in both lower lobes. Hepatobiliary: The common bile duct measures up to 9 mm, although appears to taper taper distally in a conical fashion. I do not see a clearly defined filling defect. Some of this may be a physiologic response to cholecystectomy. Several tiny hepatic cysts are present. Pancreas: Portions of the dorsal pancreatic duct are borderline dilated at 0.3 cm. Pancreas divisum noted. No well-defined pancreatic mass. There is stranding adjacent to the tail of the  pancreas and tracking along the left paracolic gutter and below the spleen, presumably this may be from pancreatitis particularly in light of the elevated lipase levels. Spleen:  Unremarkable Adrenals/Urinary Tract: There is only a band of nonfunctional tissue in the expected location of the right kidney. Compensatory hypertrophy of the left kidney. Adrenal glands unremarkable. Stomach/Bowel: Unremarkable Vascular/Lymphatic:  Unremarkable Other:  No supplemental non-categorized findings. Musculoskeletal: Unremarkable IMPRESSION: 1. Acute pancreatitis with stranding adjacent to the tail of the pancreas and tracking along the left paracolic gutter and below the spleen. 2. Pancreas divisum noted. 3. Mild extrahepatic biliary ductal dilatation, with the distal common bile duct measuring up to 9 mm in diameter. This appears to taper distally in a conical fashion. Some of this may be a physiologic response to cholecystectomy. No choledocholithiasis is identified. 4. Several tiny hepatic cysts. 5. Compensatory hypertrophy of the left kidney. 6. Despite efforts by the technologist and patient, motion artifact is present on today's exam and could not be eliminated. This reduces exam sensitivity and specificity. Electronically Signed   By: Gaylyn Rong M.D.   On: 01/30/2020 14:00     Scheduled Meds:  enoxaparin (LOVENOX) injection  40 mg Subcutaneous Q24H   famotidine  20 mg Oral BID   Continuous Infusions:  lactated ringers 200 mL/hr at 01/31/20 0951     LOS: 2 days   Time Spent in minutes   30 minutes  Sandra Brents D.O. on 01/31/2020 at 1:14 PM  Between 7am to 7pm - Please see pager noted on [email or web address]  After 7pm go to [email or web address]  And look for the night coverage person covering for me after hours  [facility] Office  [phone]

## 2020-02-01 DIAGNOSIS — K859 Acute pancreatitis without necrosis or infection, unspecified: Secondary | ICD-10-CM | POA: Diagnosis not present

## 2020-02-01 LAB — CEA: CEA: 1.4 ng/mL (ref 0.0–4.7)

## 2020-02-01 LAB — CANCER ANTIGEN 19-9: CA 19-9: 7 U/mL (ref 0–35)

## 2020-02-01 MED ORDER — OXYCODONE-ACETAMINOPHEN 5-325 MG PO TABS
1.0000 | ORAL_TABLET | ORAL | Status: DC | PRN
  Administered 2020-02-01: 2 via ORAL
  Administered 2020-02-02: 1 via ORAL
  Filled 2020-02-01: qty 2
  Filled 2020-02-01: qty 1

## 2020-02-01 MED ORDER — HYDROCODONE-ACETAMINOPHEN 5-325 MG PO TABS: 1.0000 | ORAL_TABLET | ORAL | Status: DC | PRN

## 2020-02-01 MED ORDER — HYDROMORPHONE HCL 1 MG/ML IJ SOLN
0.5000 mg | INTRAMUSCULAR | Status: DC | PRN
  Administered 2020-02-01 – 2020-02-02 (×2): 1 mg via INTRAVENOUS
  Filled 2020-02-01 (×2): qty 1

## 2020-02-01 NOTE — Telephone Encounter (Signed)
Patient was admitted to the hospital. He has an appointment with you on 02/10/20 that was already planned to follow up on the anal fissure.

## 2020-02-01 NOTE — Progress Notes (Addendum)
PROGRESS NOTE    Kyle Berry  LXB:[id number] DOB: 09-02-1970 DOA: 01/29/2020 PCP: [name] (Inactive)   Brief Narrative:  HPI on 01/29/2020 by Dr. Karmen Bongo Kyle Berry is a 49 y.o. 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  He reports that he has pancreatitis again.  He wasn't sure if it was his pancreas or his kidneys because the pain comes and goes in the LUQ and radiating to the back.  He felt mild pain last week but it got worse on Monday.  +n/v.  TNTC episodes of pancreatitis.  He has passed 4+ stones in the past.  He is not seeing blood in his urine.  Fever Tuesday, but he got he got his COVID booster that day so he thought that was related.  He got his flu shot yesterday,  Interim history Patient mated with acute on chronic pancreatitis.  Gastroenterology consulted. Assessment & Plan   Acute on chronic pancreatitis -Patient with history of acute recurrent pancreatitis/chronic pancreatitis thought to be associated with pancreatic divisum -Gastroenterology consulted and appreciated. -Patient with mildly elevated lipase- down to 74 today -CT showed inflammation in the pancreatic head -LDH 206 -Patient without failed outpatient management -MRI/MRCP showed acute pancreatitis with stranding adjacent to the tail of the pancreas and tracking along the left paracolic gutter. Mild extrahepatic biliary ductal dilatation, distal common bile duct measuring up to 9 mm in diameter. Several tiny hepatic cysts. -Patient feels IV morphine is not controlling his pain and was placed on IV Dilaudid. Discussed possibly transitioning him to oral medication, patient is extremely hesitant, and states that it has caused him to have further abdominal pain in the past -Continue antiemetics for  nausea -Discontinued IV fluids as patient is tolerating oral intake -CEA and CA 19-9 pending -Patient was placed on heart healthy diet by gastroenterology. He has tolerated up some, continues to feel some nausea and pain with his diet. -Gastroenterology recommended outpatient follow-up with EUS possibly -Will start patient on oral pain meds  Elevated LFTs -Likely secondary to the above, currently trending downward  Solitary kidney with nephrolithiasis/chronic kidney disease, stage II-IIIa -GFR has been fluctuating between stage II and IIIa -He has been diagnosed with a history of nephrolithiasis and on CT scan shows enlarging lower pole stones.  Patient also noted to have large microscopic hematuria -Creatinine is currently stable -Admitting physician discussed with urology, Dr. Alyson Ingles, who felt these stones were unlikely contributing to this patient's pain. -Stones likely contributed by chronic malnutrition and/or inadequate hydration -Patient would benefit from outpatient urology  Essential hypertension -Suspect uncontrolled BP secondary to pain -Patient has not been taking any medications for this -Given that he has a solitary kidney with some component of chronic kidney disease, patient would likely benefit from hypertensive management and control -Continue to monitor and if needed will start medications  Chronic normocytic anemia -Baseline hemoglobin has ranged from approximately 8-10, currently 10 -Patient did have hemoglobin of 11.7 on admission however the drop likely dilutional component given IV fluids -discontinued IV fluids -Continue to monitor CBC  Hypokalemia -Resolved, continue to monitor BMP  DVT Prophylaxis Lovenox  Code Status: Full  Family Communication: None at bedside  Disposition Plan:  Status is: Inpatient  Remains inpatient appropriate because:IV treatments appropriate due to intensity of illness or inability to take PO   Dispo: The patient is  from: Home  Anticipated d/c is to: Home              Anticipated d/c date is: 1-2 days              Patient currently is not medically stable to d/c.   Consultants Gastroenterology  Procedures  None  Antibiotics   Anti-infectives (From admission, onward)   None      Subjective:   Kyle Berry seen and examined today.  Continues to have abdominal pain but states he feels hungry.  Has some nausea occasionally.  Denies chest pain or shortness of breath, dizziness or headache.   Objective:   Vitals:   01/31/20 0942 01/31/20 1643 01/31/20 2314 02/01/20 0454  BP: (!) 142/101 (!) 156/123 (!) 148/98 (!) 137/91  Pulse: 67  76 64  Resp: 18 18 20 20   Temp: 98.5 F (36.9 C) 98.6 F (37 C) 97.7 F (36.5 C) 98.7 F (37.1 C)  TempSrc: Oral Oral Oral Oral  SpO2: 99% 95% 98% 98%  Weight:      Height:        Intake/Output Summary (Last 24 hours) at 02/01/2020 1333 Last data filed at 01/31/2020 2100 Gross per 24 hour  Intake 125 ml  Output --  Net 125 ml   Filed Weights   01/29/20 0800  Weight: 74.4 kg   Exam  General: Well developed, well nourished, NAD, appears stated age  HEENT: NCAT, mucous membranes moist.   Cardiovascular: S1 S2 auscultated, RRR, no murmur  Respiratory: Clear to auscultation bilaterally  Abdomen: Soft, RUE/Epigastric TTP, nondistended, + bowel sounds  Extremities: warm dry without cyanosis clubbing or edema  Neuro: AAOx3, nonfocal  Psych: appropriate mood and affect   Data Reviewed: I have personally reviewed following labs and imaging studies  CBC: Recent Labs  Lab 01/29/20 0101 01/30/20 0359  WBC 5.5 5.5  HGB 11.7* 10.0*  HCT 36.9* 31.2*  MCV 88.5 88.6  PLT 243 916   Basic Metabolic Panel: Recent Labs  Lab 01/29/20 0101 01/30/20 0359 01/30/20 1734 01/31/20 0903  NA 137 139 135 135  K 3.7 3.7 3.4* 3.6  CL 105 104 98 96*  CO2 21* 27 28 32  GLUCOSE 108* 103* 91 90  BUN <5* <5* <5* <5*  CREATININE 1.39*  1.28* 1.26* 1.25*  CALCIUM 9.4 8.7* 9.0 9.1   GFR: Estimated Creatinine Clearance: 74.4 mL/min (A) (by C-G formula based on SCr of 1.25 mg/dL (H)). Liver Function Tests: Recent Labs  Lab 01/29/20 0101 01/30/20 0359 01/30/20 1734 01/31/20 0903  AST 66* 43* 38 29  ALT 46* 37 33 30  ALKPHOS 60 56 67 60  BILITOT 0.6 1.3* 1.0 0.7  PROT 6.9 5.8* 6.4* 6.4*  ALBUMIN 3.7 2.9* 3.2* 3.1*   Recent Labs  Lab 01/29/20 0101 01/31/20 0903  LIPASE 297* 74*   No results for input(s): AMMONIA in the last 168 hours. Coagulation Profile: No results for input(s): INR, PROTIME in the last 168 hours. Cardiac Enzymes: No results for input(s): CKTOTAL, CKMB, CKMBINDEX, TROPONINI in the last 168 hours. BNP (last 3 results) No results for input(s): PROBNP in the last 8760 hours. HbA1C: No results for input(s): HGBA1C in the last 72 hours. CBG: No results for input(s): GLUCAP in the last 168 hours. Lipid Profile: No results for input(s): CHOL, HDL, LDLCALC, TRIG, CHOLHDL, LDLDIRECT in the last 72 hours. Thyroid Function Tests: No results for input(s): TSH, T4TOTAL, FREET4, T3FREE, THYROIDAB in the last 72 hours. Anemia Panel: No results for input(s):  VITAMINB12, FOLATE, FERRITIN, TIBC, IRON, RETICCTPCT in the last 72 hours. Urine analysis:    Component Value Date/Time   COLORURINE YELLOW 01/29/2020 0347   APPEARANCEUR HAZY (A) 01/29/2020 0347   LABSPEC 1.017 01/29/2020 0347   PHURINE 5.0 01/29/2020 0347   GLUCOSEU NEGATIVE 01/29/2020 0347   HGBUR LARGE (A) 01/29/2020 0347   BILIRUBINUR NEGATIVE 01/29/2020 0347   KETONESUR NEGATIVE 01/29/2020 0347   PROTEINUR NEGATIVE 01/29/2020 0347   UROBILINOGEN 0.2 01/20/2015 1643   NITRITE NEGATIVE 01/29/2020 0347   LEUKOCYTESUR NEGATIVE 01/29/2020 0347   Sepsis Labs: @LABRCNTIP (procalcitonin:4,lacticidven:4)  ) Recent Results (from the past 240 hour(s))  Respiratory Panel by RT PCR (Flu A&B, Covid) - Nasopharyngeal Swab     Status: None    Collection Time: 01/29/20  7:21 AM   Specimen: Nasopharyngeal Swab  Result Value Ref Range Status   SARS Coronavirus 2 by RT PCR NEGATIVE NEGATIVE Final    Comment: (NOTE) SARS-CoV-2 target nucleic acids are NOT DETECTED.  The SARS-CoV-2 RNA is generally detectable in upper respiratoy specimens during the acute phase of infection. The lowest concentration of SARS-CoV-2 viral copies this assay can detect is 131 copies/mL. A negative result does not preclude SARS-Cov-2 infection and should not be used as the sole basis for treatment or other patient management decisions. A negative result may occur with  improper specimen collection/handling, submission of specimen other than nasopharyngeal swab, presence of viral mutation(s) within the areas targeted by this assay, and inadequate number of viral copies (<131 copies/mL). A negative result must be combined with clinical observations, patient history, and epidemiological information. The expected result is Negative.  Fact Sheet for Patients:  PinkCheek.be  Fact Sheet for Healthcare Providers:  GravelBags.it  This test is no t yet approved or cleared by the [place] FDA and  has been authorized for detection and/or diagnosis of SARS-CoV-2 by FDA under an Emergency Use Authorization (EUA). This EUA will remain  in effect (meaning this test can be used) for the duration of the COVID-19 declaration under Section 564(b)(1) of the Act, 21 U.S.C. section 360bbb-3(b)(1), unless the authorization is terminated or revoked sooner.     Influenza A by PCR NEGATIVE NEGATIVE Final   Influenza B by PCR NEGATIVE NEGATIVE Final    Comment: (NOTE) The Xpert Xpress SARS-CoV-2/FLU/RSV assay is intended as an aid in  the diagnosis of influenza from Nasopharyngeal swab specimens and  should not be used as a sole basis for treatment. Nasal washings and  aspirates are unacceptable for Xpert  Xpress SARS-CoV-2/FLU/RSV  testing.  Fact Sheet for Patients: PinkCheek.be  Fact Sheet for Healthcare Providers: GravelBags.it  This test is not yet approved or cleared by the [place] FDA and  has been authorized for detection and/or diagnosis of SARS-CoV-2 by  FDA under an Emergency Use Authorization (EUA). This EUA will remain  in effect (meaning this test can be used) for the duration of the  Covid-19 declaration under Section 564(b)(1) of the Act, 21  U.S.C. section 360bbb-3(b)(1), unless the authorization is  terminated or revoked. Performed at [facility], [address], [place], [place] [address]       Radiology Studies: No results found.   Scheduled Meds: . enoxaparin (LOVENOX) injection  40 mg Subcutaneous Q24H  . famotidine  20 mg Oral BID  . lipase/protease/amylase  72,000 Units Oral TID WC   Continuous Infusions:    LOS: 3 days   Time Spent in minutes   30 minutes  [employer]  D.O. on 02/01/2020 at 1:33 PM  Between 7am to 7pm - Please see pager noted on [email or web address]  After 7pm go to [email or web address]  And look for the night coverage person covering for me after hours  [facility] Office  [phone]

## 2020-02-02 DIAGNOSIS — Q600 Renal agenesis, unilateral: Secondary | ICD-10-CM | POA: Diagnosis not present

## 2020-02-02 DIAGNOSIS — I10 Essential (primary) hypertension: Secondary | ICD-10-CM | POA: Diagnosis not present

## 2020-02-02 DIAGNOSIS — N200 Calculus of kidney: Secondary | ICD-10-CM | POA: Diagnosis not present

## 2020-02-02 DIAGNOSIS — K859 Acute pancreatitis without necrosis or infection, unspecified: Secondary | ICD-10-CM | POA: Diagnosis not present

## 2020-02-02 LAB — HEMOGLOBIN AND HEMATOCRIT, BLOOD
HCT: 31.5 % — ABNORMAL LOW (ref 39.0–52.0)
Hemoglobin: 10 g/dL — ABNORMAL LOW (ref 13.0–17.0)

## 2020-02-02 LAB — BASIC METABOLIC PANEL
Anion gap: 8 (ref 5–15)
BUN: 7 mg/dL (ref 6–20)
CO2: 27 mmol/L (ref 22–32)
Calcium: 9.1 mg/dL (ref 8.9–10.3)
Chloride: 101 mmol/L (ref 98–111)
Creatinine, Ser: 1.34 mg/dL — ABNORMAL HIGH (ref 0.61–1.24)
GFR, Estimated: >60 mL/min (ref >60)
Glucose, Bld: 110 mg/dL — ABNORMAL HIGH (ref 70–99)
Potassium: 4.1 mmol/L (ref 3.5–5.1)
Sodium: 136 mmol/L (ref 135–145)

## 2020-02-02 MED ORDER — OXYCODONE-ACETAMINOPHEN 5-325 MG PO TABS
1.0000 | ORAL_TABLET | Freq: Four times a day (QID) | ORAL | 0 refills | Status: DC | PRN
Start: 2020-02-02 — End: 2020-04-15

## 2020-02-02 MED ORDER — FAMOTIDINE 20 MG PO TABS
20.0000 mg | ORAL_TABLET | Freq: Two times a day (BID) | ORAL | 0 refills | Status: DC
Start: 2020-02-02 — End: 2020-10-31

## 2020-02-02 MED ORDER — PANCRELIPASE (LIP-PROT-AMYL) 36000-114000 UNITS PO CPEP
72000.0000 [IU] | ORAL_CAPSULE | Freq: Three times a day (TID) | ORAL | 0 refills | Status: DC
Start: 2020-02-02 — End: 2020-12-31

## 2020-02-02 NOTE — Plan of Care (Signed)
  Problem: Education: Goal: Knowledge of General Education information will improve Description: Including pain rating scale, medication(s)/side effects and non-pharmacologic comfort measures Outcome: Adequate for Discharge   Problem: Health Behavior/Discharge Planning: Goal: Ability to manage health-related needs will improve Outcome: Adequate for Discharge   Problem: Clinical Measurements: Goal: Ability to maintain clinical measurements within normal limits will improve Outcome: Adequate for Discharge Goal: Will remain free from infection Outcome: Adequate for Discharge Goal: Diagnostic test results will improve Outcome: Adequate for Discharge Goal: Respiratory complications will improve Outcome: Adequate for Discharge Goal: Cardiovascular complication will be avoided Outcome: Adequate for Discharge   Problem: Activity: Goal: Risk for activity intolerance will decrease Outcome: Adequate for Discharge   Problem: Nutrition: Goal: Adequate nutrition will be maintained Outcome: Adequate for Discharge   Problem: Coping: Goal: Level of anxiety will decrease Outcome: Adequate for Discharge   Problem: Elimination: Goal: Will not experience complications related to bowel motility Outcome: Adequate for Discharge Goal: Will not experience complications related to urinary retention Outcome: Adequate for Discharge   Problem: Pain Managment: Goal: General experience of comfort will improve Outcome: Adequate for Discharge   Problem: Safety: Goal: Ability to remain free from injury will improve Outcome: Adequate for Discharge   Problem: Skin Integrity: Goal: Risk for impaired skin integrity will decrease Outcome: Adequate for Discharge   Problem: Education: Goal: Knowledge of Pancreatitis treatment and prevention will improve Outcome: Adequate for Discharge   Problem: Health Behavior/Discharge Planning: Goal: Ability to formulate a plan to maintain an alcohol-free life will  improve Outcome: Adequate for Discharge   Problem: Nutritional: Goal: Ability to achieve adequate nutritional intake will improve Outcome: Adequate for Discharge   Problem: Clinical Measurements: Goal: Complications related to the disease process, condition or treatment will be avoided or minimized Outcome: Adequate for Discharge   

## 2020-02-02 NOTE — Discharge Summary (Signed)
Physician Discharge Summary  Kyle Berry BSW:[id number] DOB: 01-11-1971 DOA: 01/29/2020  PCP: [name] (Inactive)  Admit date: 01/29/2020 Discharge date: 02/02/2020  Time spent: 45 minutes  Recommendations for Outpatient Follow-up:  Patient will be discharged to home.  Patient will need to follow up with primary care provider within one week of discharge, repeat CBC and BMP.  Follow up with gastroenterology. Patient should continue medications as prescribed.  Patient should follow a heart healthy diet.   Discharge Diagnoses:  Acute on chronic pancreatitis Elevated LFTs Solitary kidney with nephrolithiasis/chronic kidney disease, stage II-IIIa Essential hypertension Chronic normocytic anemia Hypokalemia  Discharge Condition: Stable  Diet recommendation: heart healthy  Filed Weights   01/29/20 0800  Weight: 74.4 kg    History of present illness:  on 01/29/2020 by Dr. 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 He wasn't sure if it was his pancreas or his kidneys because the pain comes and goes in the LUQ and radiating to the back. He felt mild pain last week but it got worse on Monday. +n/v. TNTC episodes of pancreatitis. He has passed 4+ stones in the past. He is not seeing blood in his urine. Fever Tuesday, but he got he got his COVID booster that day so he thought that was related. He got his flu shot yesterday,  Hospital Course:  Acute on chronic pancreatitis -Patient with history of acute recurrent pancreatitis/chronic pancreatitis thought to be associated with pancreatic divisum -Gastroenterology consulted and appreciated. -Patient with mildly elevated lipase- down to 74 today -CT  showed inflammation in the pancreatic head -LDH 206 -Patient without failed outpatient management -MRI/MRCP showed acute pancreatitis with stranding adjacent to the tail of the pancreas and tracking along the left paracolic gutter. Mild extrahepatic biliary ductal dilatation, distal common bile duct measuring up to 9 mm in diameter. Several tiny hepatic cysts. -Patient feels IV morphine is not controlling his pain and was placed on IV Dilaudid. Discussed possibly transitioning him to oral medication, patient is extremely hesitant, and states that it has caused him to have further abdominal pain in the past -Continue antiemetics for nausea -Discontinued IV fluids as patient is tolerating oral intake -CEA 1.4 and CA 19-9 7 -Patient was placed on heart healthy diet by gastroenterology. He has tolerated up some, continues to feel some nausea and pain with his diet. -Gastroenterology recommended outpatient follow-up with [facility] possibly -Given script for pain meds for one week -continue creon  Elevated LFTs -Likely secondary to the above, and have normalized  Solitary kidney with nephrolithiasis/chronic kidney disease, stage II-IIIa -GFR has been fluctuating between stage II and IIIa -He has been diagnosed with a history of nephrolithiasis and on CT scan shows enlarging lower pole stones.  Patient also noted to have large microscopic hematuria -Creatinine is currently stable -Admitting physician discussed with urology, Dr. Alyson Ingles, who felt these stones were unlikely contributing to this patient's pain. -Stones likely contributed by chronic malnutrition and/or inadequate hydration -Patient would benefit from outpatient urology  Essential hypertension -Suspect uncontrolled BP secondary to pain -Patient has not been taking any medications for this -Given that he has a solitary kidney with some component of chronic kidney disease, patient would likely benefit from hypertensive management and  control -Continue to monitor and if needed will start medications  Chronic normocytic anemia -Baseline hemoglobin has  ranged from approximately 8-10, currently 10 -Patient did have hemoglobin of 11.7 on admission however the drop likely dilutional component given IV fluids -discontinued IV fluids -repeat CBC in one week  Hypokalemia -Resolved with replacement -Repeat BMP in one week  Consultants Gastroenterology  Procedures  None   Discharge Exam: Vitals:   02/01/20 2359 02/02/20 0500  BP: (!) 133/93 (!) 133/94  Pulse: 67 67  Resp: 18 18  Temp: 98.3 F (36.8 C) 98.7 F (37.1 C)  SpO2: 96% 96%     General: Well developed, well nourished, NAD, appears younger than stated age  HEENT: NCAT, mucous membranes moist.  Neuro: AAOx3, nonfocal  Psych: pleasant appropriate mood and affect  Discharge Instructions Discharge Instructions    Discharge instructions   Complete by: As directed    Patient will be discharged to home.  Patient will need to follow up with primary care provider within one week of discharge, repeat CBC and BMP.  Follow up with gastroenterology. Patient should continue medications as prescribed.  Patient should follow a heart healthy diet.   Increase activity slowly   Complete by: As directed      Allergies as of 02/02/2020      Reactions   Diclofenac Hives   Norvasc [amlodipine Besylate] Other (See Comments)   Fluid buildup in chest   Omeprazole Other (See Comments)   MD stopped due to pancreatitis   Prednisone Other (See Comments)   Mood swings      Medication List    STOP taking these medications   ibuprofen 200 MG tablet Commonly known as: ADVIL     TAKE these medications   famotidine 20 MG tablet Commonly known as: PEPCID Take 1 tablet (20 mg total) by mouth 2 (two) times daily.   lipase/protease/amylase 36000 UNITS Cpep capsule Commonly known as: CREON Take 2 capsules (72,000 Units total) by mouth 3 (three) times daily with  meals. What changed:   how much to take  when to take this  additional instructions   oxyCODONE-acetaminophen 5-325 MG tablet Commonly known as: PERCOCET/ROXICET Take 1-2 tablets by mouth every 6 (six) hours as needed for moderate pain or severe pain.      Allergies  Allergen Reactions  . Diclofenac Hives  . Norvasc [Amlodipine Besylate] Other (See Comments)    Fluid buildup in chest  . Omeprazole Other (See Comments)    MD stopped due to pancreatitis  . Prednisone Other (See Comments)    Mood swings    Follow-up Information    [name], [name]. Schedule an appointment as soon as possible for a visit in 1 week(s).   Why: Hospital follow up Contact information: [place] [facility] [place] [address] [phone]        Mauri Pole, MD. Schedule an appointment as soon as possible for a visit in 1 week(s).   Specialty: Gastroenterology Why: Hospital follow up Contact information: [place] [id number] [phone]                The results of significant diagnostics from this hospitalization (including imaging, microbiology, ancillary and laboratory) are listed below for reference.    Significant Diagnostic Studies: MR ABDOMEN MRCP W WO CONTAST  Result Date: 01/30/2020 CLINICAL DATA:  Recurrent abdominal pain.  Pancreatitis. EXAM: MRI ABDOMEN WITHOUT AND WITH CONTRAST (INCLUDING MRCP) TECHNIQUE: Multiplanar multisequence MR imaging of the abdomen was performed both before and after the administration of intravenous contrast. Heavily T2-weighted images  of the biliary and pancreatic ducts were obtained, and three-dimensional MRCP images were rendered by post processing. CONTRAST:  7.67mL GADAVIST GADOBUTROL 1 MMOL/ML IV SOLN COMPARISON:  CT abdomen 09/17/2019 and MRI abdomen 06/01/2019 FINDINGS: Despite efforts by the technologist and patient, motion artifact is present on today's exam and could  not be eliminated. This reduces exam sensitivity and specificity. Lower chest: Mild atelectasis in both lower lobes. Hepatobiliary: The common bile duct measures up to 9 mm, although appears to taper taper distally in a conical fashion. I do not see a clearly defined filling defect. Some of this may be a physiologic response to cholecystectomy. Several tiny hepatic cysts are present. Pancreas: Portions of the dorsal pancreatic duct are borderline dilated at 0.3 cm. Pancreas divisum noted. No well-defined pancreatic mass. There is stranding adjacent to the tail of the pancreas and tracking along the left paracolic gutter and below the spleen, presumably this may be from pancreatitis particularly in light of the elevated lipase levels. Spleen:  Unremarkable Adrenals/Urinary Tract: There is only a band of nonfunctional tissue in the expected location of the right kidney. Compensatory hypertrophy of the left kidney. Adrenal glands unremarkable. Stomach/Bowel: Unremarkable Vascular/Lymphatic:  Unremarkable Other:  No supplemental non-categorized findings. Musculoskeletal: Unremarkable IMPRESSION: 1. Acute pancreatitis with stranding adjacent to the tail of the pancreas and tracking along the left paracolic gutter and below the spleen. 2. Pancreas divisum noted. 3. Mild extrahepatic biliary ductal dilatation, with the distal common bile duct measuring up to 9 mm in diameter. This appears to taper distally in a conical fashion. Some of this may be a physiologic response to cholecystectomy. No choledocholithiasis is identified. 4. Several tiny hepatic cysts. 5. Compensatory hypertrophy of the left kidney. 6. Despite efforts by the technologist and patient, motion artifact is present on today's exam and could not be eliminated. This reduces exam sensitivity and specificity. Electronically Signed   By: Van Clines M.D.   On: 01/30/2020 14:00   CT RENAL STONE STUDY  Result Date: 01/29/2020 CLINICAL DATA:  Flank  pain.  Kidney stone suspected. EXAM: CT ABDOMEN AND PELVIS WITHOUT CONTRAST TECHNIQUE: Multidetector CT imaging of the abdomen and pelvis was performed following the standard protocol without IV contrast. COMPARISON:  CT of the abdomen and pelvis 08/20/2019 FINDINGS: Lower chest: The lung bases are clear without focal nodule, mass, or airspace disease. Heart size is normal. Significant pleural or pericardial effusion is present. Coronary artery calcifications are evident. Hepatobiliary: No focal liver abnormality is seen. Status post cholecystectomy. No biliary dilatation. Pancreas: Calcification present of the pancreas measuring 5 mm. Phlegm a tori changes are noted about the tail. No cyst or mass lesion is present. No duct dilation is present. Spleen: Normal in size without focal abnormality. Adrenals/Urinary Tract: Adrenal glands are normal bilaterally. Chronic right renal atrophy is noted. Two nonobstructing stones lower pole of the left kidney have increased in size, now measuring up to 6 mm. Obstruction is present. Ureter is normal. The urinary bladder is within normal limits. Stomach/Bowel: Stomach is within normal limits. Appendix appears normal. No evidence of bowel wall thickening, distention, or inflammatory changes. Vascular/Lymphatic: No significant vascular findings are present. No enlarged abdominal or pelvic lymph nodes. Reproductive: Uterus and bilateral adnexa are unremarkable. Other: No abdominal wall hernia or abnormality. No abdominopelvic ascites. Musculoskeletal: Vertebral body heights and alignment are maintained. Straightening of the lumbar lordosis is again seen. Degenerative changes are evident at L5-S1. No focal lytic or blastic lesions are present pelvis is within normal limits.  Hips are located and normal. IMPRESSION: 1. Inflammatory changes about the tail of the pancreas compatible with acute pancreatitis. No complicating features are present. 2. Increased size of nonobstructing stones  lower pole of the left kidney. No obstruction is present. 3. Chronic right renal atrophy. 4. Coronary artery disease. Electronically Signed   By: San Morelle M.D.   On: 01/29/2020 06:20    Microbiology: Recent Results (from the past 240 hour(s))  Respiratory Panel by RT PCR (Flu A&B, Covid) - Nasopharyngeal Swab     Status: None   Collection Time: 01/29/20  7:21 AM   Specimen: Nasopharyngeal Swab  Result Value Ref Range Status   SARS Coronavirus 2 by RT PCR NEGATIVE NEGATIVE Final    Comment: (NOTE) SARS-CoV-2 target nucleic acids are NOT DETECTED.  The SARS-CoV-2 RNA is generally detectable in upper respiratoy specimens during the acute phase of infection. The lowest concentration of SARS-CoV-2 viral copies this assay can detect is 131 copies/mL. A negative result does not preclude SARS-Cov-2 infection and should not be used as the sole basis for treatment or other patient management decisions. A negative result may occur with  improper specimen collection/handling, submission of specimen other than nasopharyngeal swab, presence of viral mutation(s) within the areas targeted by this assay, and inadequate number of viral copies (<131 copies/mL). A negative result must be combined with clinical observations, patient history, and epidemiological information. The expected result is Negative.  Fact Sheet for Patients:  PinkCheek.be  Fact Sheet for Healthcare Providers:  GravelBags.it  This test is no t yet approved or cleared by the [place] FDA and  has been authorized for detection and/or diagnosis of SARS-CoV-2 by FDA under an Emergency Use Authorization (EUA). This EUA will remain  in effect (meaning this test can be used) for the duration of the COVID-19 declaration under Section 564(b)(1) of the Act, 21 U.S.C. section 360bbb-3(b)(1), unless the authorization is terminated or revoked sooner.     Influenza  A by PCR NEGATIVE NEGATIVE Final   Influenza B by PCR NEGATIVE NEGATIVE Final    Comment: (NOTE) The Xpert Xpress SARS-CoV-2/FLU/RSV assay is intended as an aid in  the diagnosis of influenza from Nasopharyngeal swab specimens and  should not be used as a sole basis for treatment. Nasal washings and  aspirates are unacceptable for Xpert Xpress SARS-CoV-2/FLU/RSV  testing.  Fact Sheet for Patients: PinkCheek.be  Fact Sheet for Healthcare Providers: GravelBags.it  This test is not yet approved or cleared by the [place] FDA and  has been authorized for detection and/or diagnosis of SARS-CoV-2 by  FDA under an Emergency Use Authorization (EUA). This EUA will remain  in effect (meaning this test can be used) for the duration of the  Covid-19 declaration under Section 564(b)(1) of the Act, 21  U.S.C. section 360bbb-3(b)(1), unless the authorization is  terminated or revoked. Performed at [facility], [place] [address] [place], [place] [address]      Labs: Basic Metabolic Panel: Recent Labs  Lab 01/29/20 0101 01/30/20 0359 01/30/20 1734 01/31/20 0903 02/02/20 0532  NA 137 139 135 135 136  K 3.7 3.7 3.4* 3.6 4.1  CL 105 104 98 96* 101  CO2 21* 27 28 32 27  GLUCOSE 108* 103* 91 90 110*  BUN <5* <5* <5* <5* 7  CREATININE 1.39* 1.28* 1.26* 1.25* 1.34*  CALCIUM 9.4 8.7* 9.0 9.1 9.1   Liver Function Tests: Recent Labs  Lab 01/29/20 0101 01/30/20 0359 01/30/20 1734 01/31/20 9528  AST 66* 43* 38 29  ALT 46* 37 33 30  ALKPHOS 60 56 67 60  BILITOT 0.6 1.3* 1.0 0.7  PROT 6.9 5.8* 6.4* 6.4*  ALBUMIN 3.7 2.9* 3.2* 3.1*   Recent Labs  Lab 01/29/20 0101 01/31/20 0903  LIPASE 297* 74*   No results for input(s): AMMONIA in the last 168 hours. CBC: Recent Labs  Lab 01/29/20 0101 01/30/20 0359 02/02/20 0532  WBC 5.5 5.5  --   HGB 11.7* 10.0* 10.0*  HCT 36.9* 31.2* 31.5*  MCV 88.5 88.6  --   PLT 243  210  --    Cardiac Enzymes: No results for input(s): CKTOTAL, CKMB, CKMBINDEX, TROPONINI in the last 168 hours. BNP: BNP (last 3 results) No results for input(s): BNP in the last 8760 hours.  ProBNP (last 3 results) No results for input(s): PROBNP in the last 8760 hours.  CBG: No results for input(s): GLUCAP in the last 168 hours.     Signed:  Cristal Ford  [facility] 02/02/2020, 9:24 AM

## 2020-02-02 NOTE — Progress Notes (Signed)
Patient given discharge instructions and stated understanding. 

## 2020-02-02 NOTE — Telephone Encounter (Signed)
Got it, thank you!!

## 2020-02-10 ENCOUNTER — Encounter: Payer: Self-pay | Admitting: Gastroenterology

## 2020-02-10 ENCOUNTER — Ambulatory Visit (INDEPENDENT_AMBULATORY_CARE_PROVIDER_SITE_OTHER): Payer: Managed Care, Other (non HMO) | Admitting: Gastroenterology

## 2020-02-10 VITALS — BP 126/74 | HR 83 | Ht 73.0 in | Wt 173.0 lb

## 2020-02-10 DIAGNOSIS — K859 Acute pancreatitis without necrosis or infection, unspecified: Secondary | ICD-10-CM

## 2020-02-10 DIAGNOSIS — Q453 Other congenital malformations of pancreas and pancreatic duct: Secondary | ICD-10-CM

## 2020-02-10 MED ORDER — ZENPEP 40000-126000 UNITS PO CPEP: ORAL_CAPSULE | ORAL | 3 refills | Status: DC

## 2020-02-10 NOTE — Progress Notes (Signed)
Kyle Berry    [id number]    02/15/1971  Primary Care Physician:[name], Brayton Layman (Inactive)  Referring Physician: No referring provider defined for this encounter.   Chief complaint: Recurrent pancreatitis  HPI:  49 year old gentleman here for follow-up visit for recurrent pancreatitis  He was hospitalized October 15-19 with recurrent acute pancreatitis , he continues to have abdominal discomfort and intermittent nausea  Lipase was elevated to 297 on admission  He also has nephrolithiasis and chronic kidney disease  MRI MRCP January 30, 2020: Acute pancreatitis with stranding adjacent to the tail.  Pancreas divisum.  Mild extrahepatic duct dilation with distal CBD measuring 9 mm, appears to be tapering.  He is s/p cholecystectomy with no choledocholithiasis   His abdominal pain is improving.  Bowel habits are at baseline.  Denies any constipation or diarrhea.    Rectal discomfort and rectal bleeding has resolved  Colonoscopy October 30, 2019: 20 mm polyp removed with hot snare from cecum and 30 mm polyp removed with hot snare from rectosigmoid area that was clipped EGD October 30, 2019: Erosive gastritis otherwise unremarkable  He was hospitalized post EGD & colonoscopy with post polypectomy bleed  Colonoscopy November 02, 2019 possible site of post polypectomy bleed from cecum, additional clips placed.  No further bleeding  He re presented to ER July 23 with complaints of abdominal pain, coffee-ground emesis stable hemoglobin underwent EGD that was unremarkable other than mild gastropathy.  Hemoglobin has remained stable  10/2016 MRCP showed acute pancreatitis, suspicion for pancreas divisum, no evidence of chronic pancreatitis.  08/2016 MRI/MRCP raised question of chronic pancreatitis with focally dilated portion of dorsal pancreatic duct and fused mild irregularity of pancreatic duct.  Outpatient Encounter Medications as of 02/10/2020  Medication  Sig  . famotidine (PEPCID) 20 MG tablet Take 1 tablet (20 mg total) by mouth 2 (two) times daily.  . lipase/protease/amylase (CREON) 36000 UNITS CPEP capsule Take 2 capsules (72,000 Units total) by mouth 3 (three) times daily with meals.  Marland Kitchen oxyCODONE-acetaminophen (PERCOCET/ROXICET) 5-325 MG tablet Take 1-2 tablets by mouth every 6 (six) hours as needed for moderate pain or severe pain.   No facility-administered encounter medications on file as of 02/10/2020.    Allergies as of 02/10/2020 - Review Complete 02/10/2020  Allergen Reaction Noted  . Diclofenac Hives 12/14/2013  . Norvasc [amlodipine besylate] Other (See Comments) 08/15/2016  . Omeprazole Other (See Comments) 08/15/2016  . Prednisone Other (See Comments) 12/14/2013    Past Medical History:  Diagnosis Date  . Chronic bronchitis ([place])   . Chronic lower back pain   . Congenital single kidney   . GERD (gastroesophageal reflux disease)   . Headache    "once/month" (05/13/2017)  . High cholesterol   . Hypertension   . Migraine    "a couple/year" (05/13/2017)  . Nephrolithiasis 05/13/2017  . Pancreatitis   . Pneumonia ~ 09/2015  . Recurrent acute pancreatitis     Past Surgical History:  Procedure Laterality Date  . CHOLECYSTECTOMY N/A 10/23/2017   Procedure: LAPAROSCOPIC CHOLECYSTECTOMY WITH INTRAOPERATIVE CHOLANGIOGRAM;  Surgeon: Johnathan Hausen, MD;  Location: [facility];  Service: General;  Laterality: N/A;  . COLONOSCOPY WITH PROPOFOL N/A 11/02/2019   Procedure: COLONOSCOPY WITH PROPOFOL;  Surgeon: Irene Shipper, MD;  Location: [facility] ENDOSCOPY;  Service: Endoscopy;  Laterality: N/A;  . ESOPHAGOGASTRODUODENOSCOPY (EGD) WITH PROPOFOL N/A 11/06/2019   Procedure: ESOPHAGOGASTRODUODENOSCOPY (EGD) WITH PROPOFOL;  Surgeon: Irene Shipper, MD;  Location: [place];  Service: Endoscopy;  Laterality: N/A;  . HEMOSTASIS CLIP PLACEMENT  11/02/2019   Procedure: HEMOSTASIS CLIP PLACEMENT;  Surgeon: Irene Shipper, MD;  Location: [facility] ENDOSCOPY;   Service: Endoscopy;;  . NO PAST SURGERIES      Family History  Problem Relation Age of Onset  . Hypertension Other   . Hypertension Mother   . Hypertension Father   . Kidney disease Father   . Hypertension Sister   . Diabetes Sister   . Hypertension Brother   . Pancreatic cancer Paternal Grandmother   . Colon cancer Cousin   . Stomach cancer Neg Hx   . Esophageal cancer Neg Hx     Social History   Socioeconomic History  . Marital status: Single    Spouse name: Not on file  . Number of children: Not on file  . Years of education: Not on file  . Highest education level: Not on file  Occupational History  . Occupation: Engineer, building services  Tobacco Use  . Smoking status: Never Smoker  . Smokeless tobacco: Never Used  Vaping Use  . Vaping Use: Never used  Substance and Sexual Activity  . Alcohol use: Not Currently    Comment: 05/13/2017 "might have beer a few times/year"  . Drug use: No  . Sexual activity: Yes    Partners: Female    Birth control/protection: Condom  Other Topics Concern  . Not on file  Social History Narrative  . Not on file   Social Determinants of Health   Financial Resource Strain:   . Difficulty of Paying Living Expenses: Not on file  Food Insecurity:   . Worried About Charity fundraiser in the Last Year: Not on file  . Ran Out of Food in the Last Year: Not on file  Transportation Needs:   . Lack of Transportation (Medical): Not on file  . Lack of Transportation (Non-Medical): Not on file  Physical Activity:   . Days of Exercise per Week: Not on file  . Minutes of Exercise per Session: Not on file  Stress:   . Feeling of Stress : Not on file  Social Connections:   . Frequency of Communication with Friends and Family: Not on file  . Frequency of Social Gatherings with Friends and Family: Not on file  . Attends Religious Services: Not on file  . Active Member of Clubs or Organizations: Not on file  . Attends Archivist Meetings:  Not on file  . Marital Status: Not on file  Intimate Partner Violence:   . Fear of Current or Ex-Partner: Not on file  . Emotionally Abused: Not on file  . Physically Abused: Not on file  . Sexually Abused: Not on file      Review of systems: All other review of systems negative except as mentioned in the HPI.   Physical Exam: Vitals:   02/10/20 0833  BP: 126/74  Pulse: 83   Body mass index is 22.82 kg/m. 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 pancreatitis, status post cholecystectomy, likely etiology pancreas divisum  Will refer to [facility] for evaluation and  management of recurrent pancreatitis with pancreas divisum EUS and ERCP with possible minor papilla sphincterotomy  Chronic pancreatitis with pancreatic insufficiency: Creon was denied by insurance, will switch to Zenpep 80,000 units with meals and 5000 units with snacks  Continue with small frequent meals with low-fat diet Maintain adequate hydration with 60 to 80 ounces of fluid per day  Anal fissure has healed  Return in 3 months or sooner if needed  This visit required >40 minutes of patient care (this includes precharting, chart review, review of results, face-to-face time used for counseling as well as treatment plan and follow-up. The patient was provided an opportunity to ask questions and all were answered. The patient agreed with the plan and demonstrated an understanding of the instructions.  Damaris Hippo , MD    CC: No ref. provider found

## 2020-02-10 NOTE — Patient Instructions (Signed)
Start Zenpep 40,000unites, take two tablets with meals and 1 with snacks  We will refer you to [facility] to see Dr Harl Bowie   If you are age 50 or older, your body mass index should be between 23-30. Your Body mass index is 22.82 kg/m. If this is out of the aforementioned range listed, please consider follow up with your Primary Care Provider.  If you are age 59 or younger, your body mass index should be between 19-25. Your Body mass index is 22.82 kg/m. If this is out of the aformentioned range listed, please consider follow up with your Primary Care Provider.    I appreciate the  opportunity to care for you  Thank You   Harl Bowie , MD

## 2020-02-15 ENCOUNTER — Telehealth: Payer: Self-pay | Admitting: *Deleted

## 2020-02-15 NOTE — Telephone Encounter (Signed)
Faxed referral form and all records and insurance information to [facility] for advaned Endoscopy for recurrent pancreatitis today  Waiting for appointment to be scheduled

## 2020-04-04 NOTE — Telephone Encounter (Signed)
Patient is scheduled with [facility] 05/10/20.

## 2020-04-11 ENCOUNTER — Telehealth: Payer: Self-pay | Admitting: Gastroenterology

## 2020-04-11 ENCOUNTER — Telehealth: Payer: Self-pay

## 2020-04-11 NOTE — Telephone Encounter (Signed)
The shortness of breath at rest concerns me - if he is significantly SOB at rest I think he will need to get checked out at the ED  If he thinks the SOB is not really that bad we can send in promethazine 25 mg po q 8 hrs prn nausea and vomiting and I can send in some pain meds if he is out and I can see him tomorrow in a work in slot in AM - I have some open we can use  Let me know if I need to send in some pain meds tonight and I will do it - you can tell him I will and he can get at pharmacy w/ the promethazine

## 2020-04-11 NOTE — Telephone Encounter (Signed)
Patient called and states since yesterday he has had increased symptoms of a Flare-up. Pain in mid back=8 (intermittent), diarrhea (mix of liquid and soft, normal color) about 4 episodes daily, SOB even at rest, sweats this morning but did not take his temp, emesis ( able to keep sips of liquid down).  He is trying not to go to the hospital. As DOD please advise.

## 2020-04-11 NOTE — Telephone Encounter (Signed)
Called patient and he states he is going to go to [facility] ED because of his SOB

## 2020-04-11 NOTE — Telephone Encounter (Signed)
Inbound call from patient requesting a call back please.  States he is having a flare up again due to his recurrent pancreatitis.

## 2020-04-12 ENCOUNTER — Telehealth: Payer: Self-pay | Admitting: Gastroenterology

## 2020-04-12 ENCOUNTER — Emergency Department (HOSPITAL_COMMUNITY): Payer: Managed Care, Other (non HMO)

## 2020-04-12 ENCOUNTER — Other Ambulatory Visit: Payer: Self-pay

## 2020-04-12 ENCOUNTER — Telehealth: Payer: Self-pay

## 2020-04-12 ENCOUNTER — Encounter (HOSPITAL_COMMUNITY): Payer: Self-pay | Admitting: Emergency Medicine

## 2020-04-12 ENCOUNTER — Inpatient Hospital Stay (HOSPITAL_COMMUNITY)
Admission: EM | Admit: 2020-04-12 | Discharge: 2020-04-15 | DRG: 439 | Disposition: A | Discharge status: Home / Self Care | Payer: Managed Care, Other (non HMO) | Attending: Internal Medicine | Admitting: Internal Medicine

## 2020-04-12 DIAGNOSIS — N390 Urinary tract infection, site not specified: Secondary | ICD-10-CM

## 2020-04-12 DIAGNOSIS — Z8249 Family history of ischemic heart disease and other diseases of the circulatory system: Secondary | ICD-10-CM

## 2020-04-12 DIAGNOSIS — M545 Low back pain, unspecified: Secondary | ICD-10-CM | POA: Diagnosis present

## 2020-04-12 DIAGNOSIS — N181 Chronic kidney disease, stage 1: Secondary | ICD-10-CM | POA: Diagnosis present

## 2020-04-12 DIAGNOSIS — G8929 Other chronic pain: Secondary | ICD-10-CM | POA: Diagnosis present

## 2020-04-12 DIAGNOSIS — K859 Acute pancreatitis without necrosis or infection, unspecified: Principal | ICD-10-CM | POA: Diagnosis present

## 2020-04-12 DIAGNOSIS — K861 Other chronic pancreatitis: Secondary | ICD-10-CM

## 2020-04-12 DIAGNOSIS — E876 Hypokalemia: Secondary | ICD-10-CM | POA: Diagnosis present

## 2020-04-12 DIAGNOSIS — K219 Gastro-esophageal reflux disease without esophagitis: Secondary | ICD-10-CM | POA: Diagnosis present

## 2020-04-12 DIAGNOSIS — Q453 Other congenital malformations of pancreas and pancreatic duct: Secondary | ICD-10-CM

## 2020-04-12 DIAGNOSIS — N200 Calculus of kidney: Secondary | ICD-10-CM | POA: Diagnosis not present

## 2020-04-12 DIAGNOSIS — J449 Chronic obstructive pulmonary disease, unspecified: Secondary | ICD-10-CM | POA: Diagnosis present

## 2020-04-12 DIAGNOSIS — G43909 Migraine, unspecified, not intractable, without status migrainosus: Secondary | ICD-10-CM | POA: Diagnosis present

## 2020-04-12 DIAGNOSIS — I129 Hypertensive chronic kidney disease with stage 1 through stage 4 chronic kidney disease, or unspecified chronic kidney disease: Secondary | ICD-10-CM | POA: Diagnosis present

## 2020-04-12 DIAGNOSIS — Z888 Allergy status to other drugs, medicaments and biological substances status: Secondary | ICD-10-CM

## 2020-04-12 DIAGNOSIS — Q600 Renal agenesis, unilateral: Secondary | ICD-10-CM

## 2020-04-12 DIAGNOSIS — E7800 Pure hypercholesterolemia, unspecified: Secondary | ICD-10-CM | POA: Diagnosis present

## 2020-04-12 DIAGNOSIS — Z841 Family history of disorders of kidney and ureter: Secondary | ICD-10-CM

## 2020-04-12 DIAGNOSIS — Z79899 Other long term (current) drug therapy: Secondary | ICD-10-CM

## 2020-04-12 DIAGNOSIS — Z20822 Contact with and (suspected) exposure to covid-19: Secondary | ICD-10-CM | POA: Diagnosis present

## 2020-04-12 DIAGNOSIS — Z9049 Acquired absence of other specified parts of digestive tract: Secondary | ICD-10-CM

## 2020-04-12 LAB — COMPREHENSIVE METABOLIC PANEL
ALT: 22 U/L (ref 0–44)
AST: 27 U/L (ref 15–41)
Albumin: 3.6 g/dL (ref 3.5–5.0)
Alkaline Phosphatase: 71 U/L (ref 38–126)
Anion gap: 12 (ref 5–15)
BUN: 10 mg/dL (ref 6–20)
CO2: 24 mmol/L (ref 22–32)
Calcium: 9.5 mg/dL (ref 8.9–10.3)
Chloride: 102 mmol/L (ref 98–111)
Creatinine, Ser: 1.32 mg/dL — ABNORMAL HIGH (ref 0.61–1.24)
GFR, Estimated: >60 mL/min (ref >60)
Glucose, Bld: 111 mg/dL — ABNORMAL HIGH (ref 70–99)
Potassium: 3.3 mmol/L — ABNORMAL LOW (ref 3.5–5.1)
Sodium: 138 mmol/L (ref 135–145)
Total Bilirubin: 0.5 mg/dL (ref 0.3–1.2)
Total Protein: 7.7 g/dL (ref 6.5–8.1)

## 2020-04-12 LAB — LIPASE, BLOOD: Lipase: 73 U/L — ABNORMAL HIGH (ref 11–51)

## 2020-04-12 LAB — CBC
HCT: 41.5 % (ref 39.0–52.0)
Hemoglobin: 13.1 g/dL (ref 13.0–17.0)
MCH: 29.6 pg (ref 26.0–34.0)
MCHC: 31.6 g/dL (ref 30.0–36.0)
MCV: 93.7 fL (ref 80.0–100.0)
Platelets: 259 10*3/uL (ref 150–400)
RBC: 4.43 MIL/uL (ref 4.22–5.81)
RDW: 19 % — ABNORMAL HIGH (ref 11.5–15.5)
WBC: 9.2 10*3/uL (ref 4.0–10.5)
nRBC: 0 % (ref 0.0–0.2)

## 2020-04-12 LAB — URINALYSIS, ROUTINE W REFLEX MICROSCOPIC
Bacteria, UA: NONE SEEN
Glucose, UA: NEGATIVE mg/dL
Hgb urine dipstick: NEGATIVE
Ketones, ur: 5 mg/dL — AB
Nitrite: NEGATIVE
Protein, ur: 100 mg/dL — AB
Specific Gravity, Urine: 1.038 — ABNORMAL HIGH (ref 1.005–1.030)
pH: 5 (ref 5.0–8.0)

## 2020-04-12 LAB — MAGNESIUM: Magnesium: 2 mg/dL (ref 1.7–2.4)

## 2020-04-12 LAB — SARS CORONAVIRUS 2 (TAT 6-24 HRS): SARS Coronavirus 2: NEGATIVE

## 2020-04-12 MED ORDER — PANCRELIPASE (LIP-PROT-AMYL) 36000-114000 UNITS PO CPEP
72000.0000 [IU] | ORAL_CAPSULE | Freq: Three times a day (TID) | ORAL | Status: DC
  Administered 2020-04-12 – 2020-04-13 (×4): 72000 [IU] via ORAL
  Filled 2020-04-12 (×12): qty 2

## 2020-04-12 MED ORDER — ACETAMINOPHEN 650 MG RE SUPP: 650.0000 mg | Freq: Four times a day (QID) | RECTAL | Status: DC | PRN

## 2020-04-12 MED ORDER — PROMETHAZINE HCL 25 MG/ML IJ SOLN
25.0000 mg | Freq: Once | INTRAMUSCULAR | Status: AC
  Administered 2020-04-12: 25 mg via INTRAVENOUS
  Filled 2020-04-12: qty 1

## 2020-04-12 MED ORDER — SODIUM CHLORIDE 0.9 % IV SOLN
1.0000 g | INTRAVENOUS | Status: DC
  Administered 2020-04-13: 1 g via INTRAVENOUS
  Filled 2020-04-12: qty 10

## 2020-04-12 MED ORDER — MORPHINE SULFATE (PF) 4 MG/ML IV SOLN
4.0000 mg | Freq: Once | INTRAVENOUS | Status: AC
  Administered 2020-04-12: 4 mg via INTRAVENOUS
  Filled 2020-04-12: qty 1

## 2020-04-12 MED ORDER — HYDRALAZINE HCL 25 MG PO TABS: 25.0000 mg | ORAL_TABLET | Freq: Four times a day (QID) | ORAL | Status: DC | PRN

## 2020-04-12 MED ORDER — SODIUM CHLORIDE 0.9 % IV BOLUS
1500.0000 mL | Freq: Once | INTRAVENOUS | Status: AC
  Administered 2020-04-12: 1500 mL via INTRAVENOUS

## 2020-04-12 MED ORDER — PROMETHAZINE HCL 25 MG PO TABS
25.0000 mg | ORAL_TABLET | Freq: Four times a day (QID) | ORAL | Status: DC | PRN
  Administered 2020-04-13 (×2): 25 mg via ORAL
  Filled 2020-04-12 (×2): qty 1

## 2020-04-12 MED ORDER — FENTANYL CITRATE (PF) 100 MCG/2ML IJ SOLN
50.0000 ug | Freq: Once | INTRAMUSCULAR | Status: AC
  Administered 2020-04-12: 50 ug via INTRAVENOUS
  Filled 2020-04-12: qty 2

## 2020-04-12 MED ORDER — ACETAMINOPHEN 325 MG PO TABS: 650.0000 mg | ORAL_TABLET | Freq: Four times a day (QID) | ORAL | Status: DC | PRN

## 2020-04-12 MED ORDER — CHOLECALCIFEROL 10 MCG (400 UNIT) PO TABS
400.0000 [IU] | ORAL_TABLET | Freq: Every day | ORAL | Status: DC
  Administered 2020-04-13 – 2020-04-14 (×2): 400 [IU] via ORAL
  Filled 2020-04-12 (×4): qty 1

## 2020-04-12 MED ORDER — FAMOTIDINE 20 MG PO TABS
20.0000 mg | ORAL_TABLET | Freq: Two times a day (BID) | ORAL | Status: DC
  Administered 2020-04-12 – 2020-04-14 (×4): 20 mg via ORAL
  Filled 2020-04-12 (×5): qty 1

## 2020-04-12 MED ORDER — ENOXAPARIN SODIUM 40 MG/0.4ML ~~LOC~~ SOLN
40.0000 mg | SUBCUTANEOUS | Status: DC
  Administered 2020-04-12 – 2020-04-14 (×3): 40 mg via SUBCUTANEOUS
  Filled 2020-04-12 (×3): qty 0.4

## 2020-04-12 MED ORDER — OXYCODONE-ACETAMINOPHEN 5-325 MG PO TABS
1.0000 | ORAL_TABLET | Freq: Four times a day (QID) | ORAL | Status: DC | PRN
  Administered 2020-04-12 – 2020-04-13 (×3): 2 via ORAL
  Administered 2020-04-13: 1 via ORAL
  Filled 2020-04-12 (×5): qty 2

## 2020-04-12 MED ORDER — SODIUM CHLORIDE 0.9 % IV SOLN: INTRAVENOUS | Status: AC

## 2020-04-12 MED ORDER — POTASSIUM CHLORIDE CRYS ER 20 MEQ PO TBCR
40.0000 meq | EXTENDED_RELEASE_TABLET | Freq: Once | ORAL | Status: AC
  Administered 2020-04-12: 40 meq via ORAL
  Filled 2020-04-12: qty 2

## 2020-04-12 MED ORDER — ONDANSETRON HCL 4 MG/2ML IJ SOLN
4.0000 mg | Freq: Once | INTRAMUSCULAR | Status: AC
  Administered 2020-04-12: 4 mg via INTRAVENOUS
  Filled 2020-04-12: qty 2

## 2020-04-12 MED ORDER — SODIUM CHLORIDE 0.9 % IV SOLN
1.0000 g | Freq: Once | INTRAVENOUS | Status: AC
  Administered 2020-04-12: 1 g via INTRAVENOUS
  Filled 2020-04-12: qty 10

## 2020-04-12 NOTE — Telephone Encounter (Signed)
Patient called requesting to speak with a nurse he is currently at the ED and said he is in a lot of pain and the hospital is wanting to release him please advise

## 2020-04-12 NOTE — ED Notes (Signed)
Dinner Tray Ordered @ 1724 

## 2020-04-12 NOTE — H&P (Addendum)
History and Physical    Kyle Berry DDU:[id number] DOB: February 06, 1971 DOA: 04/12/2020  PCP: Cline Crock (Inactive) (Confirm with patient/family/NH records and if not entered, this has to be entered at [facility] point of entry) Patient coming from: [facility]  I have personally briefly reviewed patient's old medical records in [facility]  Chief Complaint: Stomach hurts  HPI: Kyle Berry is a 49 y.o. 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  Abdominal pain suddenly started last night, associated with strong feeling of nausea, the pain is epigastric located and radiated to the left side, cramping like, gradual getting worse.  Unable to eat and drink since last night and came to ED.  Denies any fever chills, but reported his bedding was soaked wet when he woke up in the middle of night because of the pain.  Patient was diagnosed with pancreatic divisum two months ago, and he has scheduled to see GI specialist at [facility] in January.  ED Course: Mild elevation of lipase, WBC 9.0, LFTs largely normal, CT abdomen showed stranding of the pancreas compatible with acute pancreatitis, and nonobstructive 5 mm left kidney stone.  UA showed new pyuria  Review of Systems: As per HPI otherwise 14 point review of systems negative.    Past Medical History:  Diagnosis Date  . Chronic bronchitis ([facility])   . Chronic lower back pain   . Congenital single kidney   . GERD (gastroesophageal reflux disease)   . Headache    "once/month" (05/13/2017)  . High cholesterol   . Hypertension   . Migraine    "a couple/year" (05/13/2017)  . Nephrolithiasis 05/13/2017  . Pancreatitis   . Pneumonia ~ 09/2015  . Recurrent acute pancreatitis     Past Surgical History:  Procedure Laterality  Date  . CHOLECYSTECTOMY N/A 10/23/2017   Procedure: LAPAROSCOPIC CHOLECYSTECTOMY WITH INTRAOPERATIVE CHOLANGIOGRAM;  Surgeon: Luretha Murphy, MD;  Location: [facility];  Service: General;  Laterality: N/A;  . COLONOSCOPY WITH PROPOFOL N/A 11/02/2019   Procedure: COLONOSCOPY WITH PROPOFOL;  Surgeon: Hilarie Fredrickson, MD;  Location: [facility] ENDOSCOPY;  Service: Endoscopy;  Laterality: N/A;  . ESOPHAGOGASTRODUODENOSCOPY (EGD) WITH PROPOFOL N/A 11/06/2019   Procedure: ESOPHAGOGASTRODUODENOSCOPY (EGD) WITH PROPOFOL;  Surgeon: Hilarie Fredrickson, MD;  Location: [facility] ENDOSCOPY;  Service: Endoscopy;  Laterality: N/A;  . HEMOSTASIS CLIP PLACEMENT  11/02/2019   Procedure: HEMOSTASIS CLIP PLACEMENT;  Surgeon: Hilarie Fredrickson, MD;  Location: [facility] ENDOSCOPY;  Service: Endoscopy;;  . NO PAST SURGERIES       reports that he has never smoked. He has never used smokeless tobacco. He reports previous alcohol use. He reports that he does not use drugs.  Allergies  Allergen Reactions  . Diclofenac Hives  . Norvasc [Amlodipine Besylate] Other (See Comments)    Fluid buildup in chest  . Omeprazole Other (See Comments)    MD stopped due to pancreatitis  . Prednisone Other (See Comments)    Mood swings    Family History  Problem Relation Age of Onset  . Hypertension Other   . Hypertension Mother   . Hypertension Father   . Kidney disease Father   . Hypertension Sister   . Diabetes Sister   . Hypertension Brother   . Pancreatic cancer Paternal Grandmother   . Colon cancer Cousin   . Stomach cancer Neg Hx   .  Esophageal cancer Neg Hx      Prior to Admission medications   Medication Sig Start Date End Date Taking? Authorizing Provider  famotidine (PEPCID) 20 MG tablet Take 1 tablet (20 mg total) by mouth 2 (two) times daily. 02/02/20  Yes Mikhail, Velta Addison, DO  lipase/protease/amylase (CREON) 36000 UNITS CPEP capsule Take 2 capsules (72,000 Units total) by mouth 3 (three) times daily with meals. 02/02/20  Yes Mikhail,  Maryann, DO  losartan (COZAAR) 25 MG tablet Take 25 mg by mouth daily. 03/17/20  Yes [provider]  oxyCODONE-acetaminophen (PERCOCET/ROXICET) 5-325 MG tablet Take 1-2 tablets by mouth every 6 (six) hours as needed for moderate pain or severe pain. 02/02/20  Yes Mikhail, Velta Addison, DO  promethazine (PHENERGAN) 25 MG tablet Take 25 mg by mouth every 6 (six) hours as needed for nausea or vomiting.   Yes [provider]  VITAMIN D PO Take 1 tablet by mouth daily.   Yes [provider]  Pancrelipase, Lip-Prot-Amyl, (ZENPEP) 40000-126000 units CPEP Take 2 capsules with meals and 1 with snacks Patient not taking: No sig reported 02/10/20   Mauri Pole, MD    Physical Exam: Vitals:   04/12/20 1200 04/12/20 1315 04/12/20 1500 04/12/20 1647  BP: (!) 146/95 (!) 143/88 (!) 140/93 (!) 151/94  Pulse: 69 75 67 75  Resp: 16 17 15  (!) 21  Temp:    98.1 F (36.7 C)  TempSrc:    Oral  SpO2: 99% 98% 99% 99%  Weight:      Height:        Constitutional: NAD, calm, comfortable Vitals:   04/12/20 1200 04/12/20 1315 04/12/20 1500 04/12/20 1647  BP: (!) 146/95 (!) 143/88 (!) 140/93 (!) 151/94  Pulse: 69 75 67 75  Resp: 16 17 15  (!) 21  Temp:    98.1 F (36.7 C)  TempSrc:    Oral  SpO2: 99% 98% 99% 99%  Weight:      Height:       Eyes: PERRL, lids and conjunctivae normal ENMT: Mucous membranes are dry. Posterior pharynx clear of any exudate or lesions.Normal dentition.  Neck: normal, supple, no masses, no thyromegaly Respiratory: clear to auscultation bilaterally, no wheezing, no crackles. Normal respiratory effort. No accessory muscle use.  Cardiovascular: Regular rate and rhythm, no murmurs / rubs / gallops. No extremity edema. 2+ pedal pulses. No carotid bruits.  Abdomen: Mild tenderness on epigastric area, no rebound no guarding, tenderness on left CVA no hepatosplenomegaly. Bowel sounds positive.  Musculoskeletal: no clubbing / cyanosis. No joint deformity upper  and lower extremities. Good ROM, no contractures. Normal muscle tone.  Skin: no rashes, lesions, ulcers. No induration Neurologic: CN 2-12 grossly intact. Sensation intact, DTR normal. Strength 5/5 in all 4.  Psychiatric: Normal judgment and insight. Alert and oriented x 3. Normal mood.     Labs on Admission: I have personally reviewed following labs and imaging studies  CBC: Recent Labs  Lab 04/12/20 0740  WBC 9.2  HGB 13.1  HCT 41.5  MCV 93.7  PLT [id number]   Basic Metabolic Panel: Recent Labs  Lab 04/12/20 0740  NA 138  K 3.3*  CL 102  CO2 24  GLUCOSE 111*  BUN 10  CREATININE 1.32*  CALCIUM 9.5   GFR: Estimated Creatinine Clearance: 75.2 mL/min (A) (by C-G formula based on SCr of 1.32 mg/dL (H)). Liver Function Tests: Recent Labs  Lab 04/12/20 0740  AST 27  ALT 22  ALKPHOS 71  BILITOT 0.5  PROT  7.7  ALBUMIN 3.6   Recent Labs  Lab 04/12/20 0740  LIPASE 73*   No results for input(s): AMMONIA in the last 168 hours. Coagulation Profile: No results for input(s): INR, PROTIME in the last 168 hours. Cardiac Enzymes: No results for input(s): CKTOTAL, CKMB, CKMBINDEX, TROPONINI in the last 168 hours. BNP (last 3 results) No results for input(s): PROBNP in the last 8760 hours. HbA1C: No results for input(s): HGBA1C in the last 72 hours. CBG: No results for input(s): GLUCAP in the last 168 hours. Lipid Profile: No results for input(s): CHOL, HDL, LDLCALC, TRIG, CHOLHDL, LDLDIRECT in the last 72 hours. Thyroid Function Tests: No results for input(s): TSH, T4TOTAL, FREET4, T3FREE, THYROIDAB in the last 72 hours. Anemia Panel: No results for input(s): VITAMINB12, FOLATE, FERRITIN, TIBC, IRON, RETICCTPCT in the last 72 hours. Urine analysis:    Component Value Date/Time   COLORURINE AMBER (A) 04/12/2020 1010   APPEARANCEUR HAZY (A) 04/12/2020 1010   LABSPEC 1.038 (H) 04/12/2020 1010   PHURINE 5.0 04/12/2020 1010   GLUCOSEU NEGATIVE 04/12/2020 1010   HGBUR  NEGATIVE 04/12/2020 1010   BILIRUBINUR SMALL (A) 04/12/2020 1010   KETONESUR 5 (A) 04/12/2020 1010   PROTEINUR 100 (A) 04/12/2020 1010   UROBILINOGEN 0.2 01/20/2015 1643   NITRITE NEGATIVE 04/12/2020 1010   LEUKOCYTESUR TRACE (A) 04/12/2020 1010    Radiological Exams on Admission: CT Renal Stone Study  Result Date: 04/12/2020 CLINICAL DATA:  Flank pain.  History of pancreatitis EXAM: CT ABDOMEN AND PELVIS WITHOUT CONTRAST TECHNIQUE: Multidetector CT imaging of the abdomen and pelvis was performed following the standard protocol without oral or IV contrast. COMPARISON:  January 29, 2020 and October 14, 2019. MR abdomen January 30, 2020 FINDINGS: Lower chest: Lung bases are clear. Hepatobiliary: No focal liver lesions are evident on this noncontrast enhanced study. Gallbladder is absent. There is no appreciable biliary duct dilatation. Pancreas: Calcification in the head of the pancreas again noted consistent with chronic pancreatitis change. There is soft tissue stranding surrounding much of the pancreas, similar to prior study. Fluid tracks along the posterior leftward aspect of the pancreas to the left lateral conal fascia, similar to prior study. There is no well-defined mass or pseudocyst. Pancreatic duct upper normal in size, stable. Spleen: No splenic lesions are evident. Adrenals/Urinary Tract: Adrenals bilaterally appear normal. There is severe chronic atrophy of the right kidney with only a small amount of residual renal appearing tissue noted on the right, unchanged. Compensatory hypertrophy of the left kidney noted. No left renal mass or hydronephrosis evident. There is a calculus in the lower pole of the left kidney measuring 5 x 5 mm with a nearby 4 x 2 mm calculus in the lower pole of the left kidney. No evident ureteral calculus. Urinary bladder is midline with wall thickness within normal limits. Stomach/Bowel: There is no appreciable bowel wall or mesenteric thickening. Terminal ileum  appears normal. No evident bowel obstruction. There is no appreciable free air or portal venous air. Vascular/Lymphatic: No abdominal aortic aneurysm. No vascular lesions are appreciable on this noncontrast enhanced study. There is no evident adenopathy in the abdomen or pelvis. Reproductive: Prostate and seminal vesicles are normal in size and contour. Other: Appendix appears normal. No abscess or ascites evident in the abdomen or pelvis. Musculoskeletal: There is degenerative change in the lower lumbar spine. No blastic or lytic bone lesions. No intramuscular or abdominal wall lesions are evident. IMPRESSION: 1. Soft tissue stranding surrounding portions of the pancreas with mild fluid  tracking posterior to the pancreas to the left lateral conal fascia, similar to prior study and consistent with a degree of acute pancreatitis. No mass or pseudocyst evident. Pancreatic duct upper normal in size. Calcification in the head of the pancreas indicative of underlying chronic pancreatitis. 2. Marked atrophy of right kidney, stable, with compensatory hypertrophy left kidney. Nonobstructing calculi lower pole left kidney noted, largest measuring 5 x 5 mm. No hydronephrosis or ureteral calculus present 1. Urinary bladder wall thickness normal. 3. No bowel obstruction. No abscess in the abdomen or pelvis. Appendix appears normal. 4.  Gallbladder absent. Electronically Signed   By: Lowella Grip III M.D.   On: 04/12/2020 11:03    EKG: Ordered  Assessment/Plan Active Problems:   Chronic pancreatitis (HCC)   Pancreatitis  (please populate well all problems here in Problem List. (For example, if patient is on BP meds at home and you resume or decide to hold them, it is a problem that needs to be her. Same for CAD, COPD, HLD and so on)  Acute pancreatitis, recurrent -Likely secondary to pancreatic divisum, symptom mild, BISAP=0 -Symptoms stabilized in the ED after given IV hydration and IV narcotic, but failed  p.o. challenge earlier. -Discussed with patient who is willing to start like p.o. intake tonight as tolerated and advance, plan to to floor observation overnight, if symptom improved likely can be discharged home.  Increase, symptom becomes worse will consider contact [facility] for transfer, but overall expect patient symptom likely beyond improved overnight. -Follow-up lipase in the morning  UTI complicated -CT showed no significant obstruction, on ceftriaxone -Plan to do a total of 2 weeks treatment given UTI is a complicated with a kidney stone, and patient only has a single functioning kidney.  Non-obstructive kidney stone -Treat UTI -Outpatient urology follow-up  HTN -Hold ARB for tonight  Hypokalemia -Replace and recheck  Chronic pancreatic insufficiency -Continue Creon  CKD stage I -Hypokalemia with ongoing symptoms of nauseous and poor p.o. intake -Hold ARB, on IV fluid for overnight.   DVT prophylaxis: Lovenox  code Status: Full code Family Communication: None at bedside Disposition Plan: Expect less than 2 midnight hospital stay Consults called: None Admission status: MedSurg admission   Lequita Halt MD [facility] Pager [phone]  04/12/2020, 5:20 PM

## 2020-04-12 NOTE — Telephone Encounter (Signed)
Called patient and got voice mail. Left message to call back if he still needs [place].

## 2020-04-12 NOTE — ED Triage Notes (Addendum)
Pt reports L sided abd pain radiating around to L flank, hx of pancreatitis. States he no longer drinks alcohol, decreased urine output since yesterday, only has one kidney. PCP recommended he come in yesterday but he was unable to. Nausea, worsened with eating/drinking.

## 2020-04-12 NOTE — ED Provider Notes (Signed)
[facility] EMERGENCY DEPARTMENT Provider Note   CSN: [id number] Arrival date & time: 04/12/20  [id number]     History Chief Complaint  Patient presents with  . Abdominal Pain    Kyle Berry is a 49 y.o. male.  HPI 49 year old male with history of pancreatitis presents today complaining of epigastric pain for 2 days.  He states this is consistent with his prior pancreatitis.  He has had nausea and been unable to keep down liquids.  Patient states he has some antiemetics at home.  He feels this is consistent with his prior pancreatitis.  He has drank alcohol in the past but has not been drinking recently.  He states this started after he ate a [employer].  He has follow-up scheduled at [facility].    Past Medical History:  Diagnosis Date  . Chronic bronchitis ([place])   . Chronic lower back pain   . Congenital single kidney   . GERD (gastroesophageal reflux disease)   . Headache    "once/month" (05/13/2017)  . High cholesterol   . Hypertension   . Migraine    "a couple/year" (05/13/2017)  . Nephrolithiasis 05/13/2017  . Pancreatitis   . Pneumonia ~ 09/2015  . Recurrent acute pancreatitis     Patient Active Problem List   Diagnosis Date Noted  . Symptomatic anemia 11/05/2019  . Acute lower GI bleeding 11/02/2019  . Near syncope 11/02/2019  . AKI (acute kidney injury) ([place]) 11/02/2019  . Colonic ulcer   . Rectal bleeding 10/14/2019  . Pancreatic insufficiency 05/28/2019  . Recurrent acute pancreatitis 10/17/2018  . Microscopic hematuria 10/17/2018  . Acute pancreatitis 07/23/2018  . Pancreatic divisum   . Hematemesis 10/18/2017  . Recurrent pancreatitis 05/13/2017  . Congenital single kidney 05/13/2017  . High cholesterol 05/13/2017  . Nephrolithiasis 05/13/2017  . Acute on chronic pancreatitis ([place]) 01/17/2017  . Hypokalemia   . Nausea and vomiting   . Essential hypertension   . Esophageal reflux   . Chronic pancreatitis ([place]) 11/23/2015    Past  Surgical History:  Procedure Laterality Date  . CHOLECYSTECTOMY N/A 10/23/2017   Procedure: LAPAROSCOPIC CHOLECYSTECTOMY WITH INTRAOPERATIVE CHOLANGIOGRAM;  Surgeon: Johnathan Hausen, MD;  Location: [facility] ORS;  Service: General;  Laterality: N/A;  . COLONOSCOPY WITH PROPOFOL N/A 11/02/2019   Procedure: COLONOSCOPY WITH PROPOFOL;  Surgeon: Irene Shipper, MD;  Location: [facility] ENDOSCOPY;  Service: Endoscopy;  Laterality: N/A;  . ESOPHAGOGASTRODUODENOSCOPY (EGD) WITH PROPOFOL N/A 11/06/2019   Procedure: ESOPHAGOGASTRODUODENOSCOPY (EGD) WITH PROPOFOL;  Surgeon: Irene Shipper, MD;  Location: [facility] ENDOSCOPY;  Service: Endoscopy;  Laterality: N/A;  . HEMOSTASIS CLIP PLACEMENT  11/02/2019   Procedure: HEMOSTASIS CLIP PLACEMENT;  Surgeon: Irene Shipper, MD;  Location: [facility] ENDOSCOPY;  Service: Endoscopy;;  . NO PAST SURGERIES         Family History  Problem Relation Age of Onset  . Hypertension Other   . Hypertension Mother   . Hypertension Father   . Kidney disease Father   . Hypertension Sister   . Diabetes Sister   . Hypertension Brother   . Pancreatic cancer Paternal Grandmother   . Colon cancer Cousin   . Stomach cancer Neg Hx   . Esophageal cancer Neg Hx     Social History   Tobacco Use  . Smoking status: Never Smoker  . Smokeless tobacco: Never Used  Vaping Use  . Vaping Use: Never used  Substance Use Topics  . Alcohol use: Not Currently    Comment: 05/13/2017 "might  have beer a few times/year"  . Drug use: No    Home Medications Prior to Admission medications   Medication Sig Start Date End Date Taking? Authorizing Provider  famotidine (PEPCID) 20 MG tablet Take 1 tablet (20 mg total) by mouth 2 (two) times daily. 02/02/20   Mikhail, Velta Addison, DO  lipase/protease/amylase (CREON) 36000 UNITS CPEP capsule Take 2 capsules (72,000 Units total) by mouth 3 (three) times daily with meals. 02/02/20   Mikhail, Velta Addison, DO  oxyCODONE-acetaminophen (PERCOCET/ROXICET) 5-325 MG tablet Take 1-2  tablets by mouth every 6 (six) hours as needed for moderate pain or severe pain. 02/02/20   Mikhail, Velta Addison, DO  Pancrelipase, Lip-Prot-Amyl, (ZENPEP) [phone] units CPEP Take 2 capsules with meals and 1 with snacks 02/10/20   Mauri Pole, MD    Allergies    Diclofenac, Norvasc [amlodipine besylate], Omeprazole, and Prednisone  Review of Systems   Review of Systems  Physical Exam Updated Vital Signs BP (!) 151/100   Pulse 91   Temp 98.3 F (36.8 C) (Oral)   Resp 14   Ht 1.854 m (6\' 1" )   Wt 79.4 kg   SpO2 100%   BMI 23.09 kg/m   Physical Exam  ED Results / Procedures / Treatments   Labs (all labs ordered are listed, but only abnormal results are displayed) Labs Reviewed  LIPASE, BLOOD - Abnormal; Notable for the following components:      Result Value   Lipase 73 (*)    All other components within normal limits  COMPREHENSIVE METABOLIC PANEL - Abnormal; Notable for the following components:   Potassium 3.3 (*)    Glucose, Bld 111 (*)    Creatinine, Ser 1.32 (*)    All other components within normal limits  CBC - Abnormal; Notable for the following components:   RDW 19.0 (*)    All other components within normal limits  URINALYSIS, ROUTINE W REFLEX MICROSCOPIC    EKG None  Radiology No results found.  Procedures Procedures (including critical care time)  Medications Ordered in ED Medications - No data to display  ED Course  I have reviewed the triage vital signs and the nursing notes.  Pertinent labs & imaging results that were available during my care of the patient were reviewed by me and considered in my medical decision making (see chart for details).  Clinical Course as of 04/12/20 1442  Tue Apr 12, 2020  1138 Reviewed labs, urine and ct Probable uti Rocephin ordered Will redose antiemetics and po fluid challenge [DR]    Clinical Course User Index [DR] Pattricia Boss, MD   MDM Rules/Calculators/A&P                           49 year old male history of pancreatitis presents today with abdominal pain nausea and vomiting.  He has some pain that radiates to the left.  Has a history of one functional kidney with kidney stones.  He has not had any UTI symptoms.  Here in the department he has been treated for pancreatitis with pain medicines and antiemetics.  He continues to have significant pain and inability to tolerate p.o. fluids.  He has urinalysis with 21-50 white blood cells.  Urine is cultured and received 1 g of Rocephin CT obtained to assess for kidney stones and obstructions reveal calculus in the lower pole left kidney without calculus noted in the ureter.  There is no evidence of renal mass or hydronephrosis. Patient care discussed  with Dr Chipper Herb who will see for admission. Final Clinical Impression(s) / ED Diagnoses Final diagnoses:  Chronic pancreatitis, unspecified pancreatitis type (HCC)  Urinary tract infection without hematuria, site unspecified  Kidney stone    Rx / DC Orders ED Discharge Orders    None       Margarita Grizzle, MD 04/12/20 1510

## 2020-04-13 DIAGNOSIS — K8500 Idiopathic acute pancreatitis without necrosis or infection: Secondary | ICD-10-CM | POA: Diagnosis not present

## 2020-04-13 DIAGNOSIS — N181 Chronic kidney disease, stage 1: Secondary | ICD-10-CM | POA: Diagnosis present

## 2020-04-13 DIAGNOSIS — Z9049 Acquired absence of other specified parts of digestive tract: Secondary | ICD-10-CM | POA: Diagnosis not present

## 2020-04-13 DIAGNOSIS — Q453 Other congenital malformations of pancreas and pancreatic duct: Secondary | ICD-10-CM | POA: Diagnosis not present

## 2020-04-13 DIAGNOSIS — K219 Gastro-esophageal reflux disease without esophagitis: Secondary | ICD-10-CM | POA: Diagnosis present

## 2020-04-13 DIAGNOSIS — N200 Calculus of kidney: Secondary | ICD-10-CM | POA: Diagnosis present

## 2020-04-13 DIAGNOSIS — M545 Low back pain, unspecified: Secondary | ICD-10-CM | POA: Diagnosis present

## 2020-04-13 DIAGNOSIS — G43909 Migraine, unspecified, not intractable, without status migrainosus: Secondary | ICD-10-CM | POA: Diagnosis present

## 2020-04-13 DIAGNOSIS — Z841 Family history of disorders of kidney and ureter: Secondary | ICD-10-CM | POA: Diagnosis not present

## 2020-04-13 DIAGNOSIS — J449 Chronic obstructive pulmonary disease, unspecified: Secondary | ICD-10-CM | POA: Diagnosis present

## 2020-04-13 DIAGNOSIS — Z79899 Other long term (current) drug therapy: Secondary | ICD-10-CM | POA: Diagnosis not present

## 2020-04-13 DIAGNOSIS — K861 Other chronic pancreatitis: Secondary | ICD-10-CM | POA: Diagnosis present

## 2020-04-13 DIAGNOSIS — K859 Acute pancreatitis without necrosis or infection, unspecified: Secondary | ICD-10-CM | POA: Diagnosis present

## 2020-04-13 DIAGNOSIS — I129 Hypertensive chronic kidney disease with stage 1 through stage 4 chronic kidney disease, or unspecified chronic kidney disease: Secondary | ICD-10-CM | POA: Diagnosis present

## 2020-04-13 DIAGNOSIS — G8929 Other chronic pain: Secondary | ICD-10-CM | POA: Diagnosis present

## 2020-04-13 DIAGNOSIS — Z8249 Family history of ischemic heart disease and other diseases of the circulatory system: Secondary | ICD-10-CM | POA: Diagnosis not present

## 2020-04-13 DIAGNOSIS — Z888 Allergy status to other drugs, medicaments and biological substances status: Secondary | ICD-10-CM | POA: Diagnosis not present

## 2020-04-13 DIAGNOSIS — E876 Hypokalemia: Secondary | ICD-10-CM | POA: Diagnosis present

## 2020-04-13 DIAGNOSIS — E7800 Pure hypercholesterolemia, unspecified: Secondary | ICD-10-CM | POA: Diagnosis present

## 2020-04-13 DIAGNOSIS — Z20822 Contact with and (suspected) exposure to covid-19: Secondary | ICD-10-CM | POA: Diagnosis present

## 2020-04-13 DIAGNOSIS — N390 Urinary tract infection, site not specified: Secondary | ICD-10-CM | POA: Diagnosis present

## 2020-04-13 DIAGNOSIS — Q600 Renal agenesis, unilateral: Secondary | ICD-10-CM | POA: Diagnosis not present

## 2020-04-13 LAB — COMPREHENSIVE METABOLIC PANEL
ALT: 16 U/L (ref 0–44)
AST: 23 U/L (ref 15–41)
Albumin: 2.8 g/dL — ABNORMAL LOW (ref 3.5–5.0)
Alkaline Phosphatase: 63 U/L (ref 38–126)
Anion gap: 8 (ref 5–15)
BUN: 7 mg/dL (ref 6–20)
CO2: 25 mmol/L (ref 22–32)
Calcium: 8.2 mg/dL — ABNORMAL LOW (ref 8.9–10.3)
Chloride: 106 mmol/L (ref 98–111)
Creatinine, Ser: 1.16 mg/dL (ref 0.61–1.24)
GFR, Estimated: >60 mL/min (ref >60)
Glucose, Bld: 117 mg/dL — ABNORMAL HIGH (ref 70–99)
Potassium: 3.7 mmol/L (ref 3.5–5.1)
Sodium: 139 mmol/L (ref 135–145)
Total Bilirubin: 0.5 mg/dL (ref 0.3–1.2)
Total Protein: 5.9 g/dL — ABNORMAL LOW (ref 6.5–8.1)

## 2020-04-13 LAB — URINE CULTURE: Culture: NO GROWTH

## 2020-04-13 LAB — CBC
HCT: 34.2 % — ABNORMAL LOW (ref 39.0–52.0)
Hemoglobin: 10.8 g/dL — ABNORMAL LOW (ref 13.0–17.0)
MCH: 30.2 pg (ref 26.0–34.0)
MCHC: 31.6 g/dL (ref 30.0–36.0)
MCV: 95.5 fL (ref 80.0–100.0)
Platelets: 241 10*3/uL (ref 150–400)
RBC: 3.58 MIL/uL — ABNORMAL LOW (ref 4.22–5.81)
RDW: 18.9 % — ABNORMAL HIGH (ref 11.5–15.5)
WBC: 7 10*3/uL (ref 4.0–10.5)
nRBC: 0 % (ref 0.0–0.2)

## 2020-04-13 LAB — LIPASE, BLOOD: Lipase: 93 U/L — ABNORMAL HIGH (ref 11–51)

## 2020-04-13 MED ORDER — MORPHINE SULFATE (PF) 2 MG/ML IV SOLN
1.0000 mg | INTRAVENOUS | Status: DC | PRN
  Administered 2020-04-13 – 2020-04-14 (×5): 1 mg via INTRAVENOUS
  Filled 2020-04-13 (×5): qty 1

## 2020-04-13 NOTE — Progress Notes (Signed)
Progress Note    Kyle Berry  [id number] DOB: [birth year]  DOA: 04/12/2020 PCP: [name] (Inactive)    Brief Narrative:     Medical records reviewed and are as summarized below:  EISEN SILVERMAN is an 49 y.o. 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  Abdominal pain suddenly started last night, associated with strong feeling of nausea, the pain is epigastric located and radiated to the left side, cramping like, gradual getting worse.  Unable to eat and drink since last night and came to ED.  Denies any fever chills, but reported his bedding was soaked wet when he woke up in the middle of night because of the pain. Patient was diagnosed with pancreatic divisum two months ago, and he has scheduled to see GI specialist at [facility] in January.  Assessment/Plan:   Active Problems:   Chronic pancreatitis ([place])   Kidney stone   Pancreatitis   Acute pancreatitis, recurrent -Likely secondary to pancreatic divisum, symptom mild, BISAP=0 -failed POs-- worsening pain and nausea -NPO, pain control and IVF -re-assess in AM prior to restarting POs  HTN -Hold ARB  Hypokalemia -Replace and recheck  Chronic pancreatic insufficiency -Continue Creon -has appointment with [facility] in January     Family Communication/Anticipated D/C date and plan/Code Status   DVT prophylaxis: scd Code Status: Full Code.  Disposition Plan: Status is: Observation  The patient will require care spanning > 2 midnights and should be moved to inpatient because: Inpatient level of care appropriate due to severity of illness  Dispo: The patient is from: Home              Anticipated d/c is to: Home              Anticipated d/c date is: 2 days               Patient currently is not medically stable to d/c.         Medical Consultants:    None.    Subjective:   C/o pain and nausea with clear diet  Objective:    Vitals:   04/12/20 1751 04/13/20 0001 04/13/20 0556 04/13/20 1206  BP: (!) 148/96 129/85 (!) 143/95 (!) 144/99  Pulse: 84 80 69 71  Resp: 19 18 18 16   Temp: 98.5 F (36.9 C) 98.6 F (37 C) 98.3 F (36.8 C) 99 F (37.2 C)  TempSrc: Oral Oral Oral Oral  SpO2: 98% 97% 99% 99%  Weight:      Height:        Intake/Output Summary (Last 24 hours) at 04/13/2020 1251 Last data filed at 04/13/2020 1142 Gross per 24 hour  Intake 2027.49 ml  Output --  Net 2027.49 ml   Filed Weights   04/12/20 0723  Weight: 79.4 kg    Exam:  General: Appearance:    Well developed, well nourished male in no acute distress   +BS, tender to palpation  Lungs:      respirations unlabored  Heart:    Normal heart rate. Normal rhythm. No murmurs, rubs, or gallops.   MS:   All extremities are intact.   Neurologic:   Awake, alert, oriented x 3. No apparent focal neurological           defect.     Data Reviewed:   I have  personally reviewed following labs and imaging studies:  Labs: Labs show the following:   Basic Metabolic Panel: Recent Labs  Lab 04/12/20 0740 04/13/20 0052  NA 138 139  K 3.3* 3.7  CL 102 106  CO2 24 25  GLUCOSE 111* 117*  BUN 10 7  CREATININE 1.32* 1.16  CALCIUM 9.5 8.2*  MG 2.0  --    GFR Estimated Creatinine Clearance: 85.6 mL/min (by C-G formula based on SCr of 1.16 mg/dL). Liver Function Tests: Recent Labs  Lab 04/12/20 0740 04/13/20 0052  AST 27 23  ALT 22 16  ALKPHOS 71 63  BILITOT 0.5 0.5  PROT 7.7 5.9*  ALBUMIN 3.6 2.8*   Recent Labs  Lab 04/12/20 0740 04/13/20 0052  LIPASE 73* 93*   No results for input(s): AMMONIA in the last 168 hours. Coagulation profile No results for input(s): INR, PROTIME in the last 168 hours.  CBC: Recent Labs  Lab 04/12/20 0740 04/13/20 0052   WBC 9.2 7.0  HGB 13.1 10.8*  HCT 41.5 34.2*  MCV 93.7 95.5  PLT 259 241   Cardiac Enzymes: No results for input(s): CKTOTAL, CKMB, CKMBINDEX, TROPONINI in the last 168 hours. BNP (last 3 results) No results for input(s): PROBNP in the last 8760 hours. CBG: No results for input(s): GLUCAP in the last 168 hours. D-Dimer: No results for input(s): DDIMER in the last 72 hours. Hgb A1c: No results for input(s): HGBA1C in the last 72 hours. Lipid Profile: No results for input(s): CHOL, HDL, LDLCALC, TRIG, CHOLHDL, LDLDIRECT in the last 72 hours. Thyroid function studies: No results for input(s): TSH, T4TOTAL, T3FREE, THYROIDAB in the last 72 hours.  Invalid input(s): FREET3 Anemia work up: No results for input(s): VITAMINB12, FOLATE, FERRITIN, TIBC, IRON, RETICCTPCT in the last 72 hours. Sepsis Labs: Recent Labs  Lab 04/12/20 0740 04/13/20 0052  WBC 9.2 7.0    Microbiology Recent Results (from the past 240 hour(s))  SARS CORONAVIRUS 2 (TAT 6-24 HRS) Nasopharyngeal Nasopharyngeal Swab     Status: None   Collection Time: 04/12/20 11:06 AM   Specimen: Nasopharyngeal Swab  Result Value Ref Range Status   SARS Coronavirus 2 NEGATIVE NEGATIVE Final    Comment: (NOTE) SARS-CoV-2 target nucleic acids are NOT DETECTED.  The SARS-CoV-2 RNA is generally detectable in upper and lower respiratory specimens during the acute phase of infection. Negative results do not preclude SARS-CoV-2 infection, do not rule out co-infections with other pathogens, and should not be used as the sole basis for treatment or other patient management decisions. Negative results must be combined with clinical observations, patient history, and epidemiological information. The expected result is Negative.  Fact Sheet for Patients: HairSlick.no  Fact Sheet for Healthcare Providers: quierodirigir.com  This test is not yet approved or cleared by the  [place] FDA and  has been authorized for detection and/or diagnosis of SARS-CoV-2 by FDA under an Emergency Use Authorization (EUA). This EUA will remain  in effect (meaning this test can be used) for the duration of the COVID-19 declaration under Se ction 564(b)(1) of the Act, 21 U.S.C. section 360bbb-3(b)(1), unless the authorization is terminated or revoked sooner.  Performed at [facility], [address], [place], [place] [address]   Urine Culture     Status: None   Collection Time: 04/12/20  1:12 PM   Specimen: Urine, Random  Result Value Ref Range Status   Specimen Description URINE, RANDOM  Final   Special Requests NONE  Final   Culture  Final    NO GROWTH Performed at [facility], [address], [place], [place] [address]    Report Status 04/13/2020 FINAL  Final    Procedures and diagnostic studies:  CT Renal Stone Study  Result Date: 04/12/2020 CLINICAL DATA:  Flank pain.  History of pancreatitis EXAM: CT ABDOMEN AND PELVIS WITHOUT CONTRAST TECHNIQUE: Multidetector CT imaging of the abdomen and pelvis was performed following the standard protocol without oral or IV contrast. COMPARISON:  January 29, 2020 and October 14, 2019. MR abdomen January 30, 2020 FINDINGS: Lower chest: Lung bases are clear. Hepatobiliary: No focal liver lesions are evident on this noncontrast enhanced study. Gallbladder is absent. There is no appreciable biliary duct dilatation. Pancreas: Calcification in the head of the pancreas again noted consistent with chronic pancreatitis change. There is soft tissue stranding surrounding much of the pancreas, similar to prior study. Fluid tracks along the posterior leftward aspect of the pancreas to the left lateral conal fascia, similar to prior study. There is no well-defined mass or pseudocyst. Pancreatic duct upper normal in size, stable. Spleen: No splenic lesions are evident. Adrenals/Urinary Tract: Adrenals bilaterally appear normal.  There is severe chronic atrophy of the right kidney with only a small amount of residual renal appearing tissue noted on the right, unchanged. Compensatory hypertrophy of the left kidney noted. No left renal mass or hydronephrosis evident. There is a calculus in the lower pole of the left kidney measuring 5 x 5 mm with a nearby 4 x 2 mm calculus in the lower pole of the left kidney. No evident ureteral calculus. Urinary bladder is midline with wall thickness within normal limits. Stomach/Bowel: There is no appreciable bowel wall or mesenteric thickening. Terminal ileum appears normal. No evident bowel obstruction. There is no appreciable free air or portal venous air. Vascular/Lymphatic: No abdominal aortic aneurysm. No vascular lesions are appreciable on this noncontrast enhanced study. There is no evident adenopathy in the abdomen or pelvis. Reproductive: Prostate and seminal vesicles are normal in size and contour. Other: Appendix appears normal. No abscess or ascites evident in the abdomen or pelvis. Musculoskeletal: There is degenerative change in the lower lumbar spine. No blastic or lytic bone lesions. No intramuscular or abdominal wall lesions are evident. IMPRESSION: 1. Soft tissue stranding surrounding portions of the pancreas with mild fluid tracking posterior to the pancreas to the left lateral conal fascia, similar to prior study and consistent with a degree of acute pancreatitis. No mass or pseudocyst evident. Pancreatic duct upper normal in size. Calcification in the head of the pancreas indicative of underlying chronic pancreatitis. 2. Marked atrophy of right kidney, stable, with compensatory hypertrophy left kidney. Nonobstructing calculi lower pole left kidney noted, largest measuring 5 x 5 mm. No hydronephrosis or ureteral calculus present 1. Urinary bladder wall thickness normal. 3. No bowel obstruction. No abscess in the abdomen or pelvis. Appendix appears normal. 4.  Gallbladder absent.  Electronically Signed   By: Bretta Bang III M.D.   On: 04/12/2020 11:03    Medications:   . cholecalciferol  400 Units Oral Daily  . enoxaparin (LOVENOX) injection  40 mg Subcutaneous Q24H  . famotidine  20 mg Oral BID  . lipase/protease/amylase  72,000 Units Oral TID WC   Continuous Infusions: . sodium chloride 100 mL/hr at 04/13/20 0700  . cefTRIAXone (ROCEPHIN)  IV 1 g (04/13/20 1145)     LOS: 0 days   Joseph Art  [facility]   How to contact the [facility]  Attending or Consulting provider [place] or covering provider during after hours [place], for this patient?  1. Check the care team in [facility] and look for a) attending/consulting [facility] provider listed and b) the [facility] team listed 2. Log into [email or web address] and use [place]'s universal password to access. If you do not have the password, please contact the hospital operator. 3. Locate the [employer] provider you are looking for under [facility] and page to a number that you can be directly reached. 4. If you still have difficulty reaching the provider, please page the [facility] (Director on Call) for the Hospitalists listed on amion for assistance.  04/13/2020, 12:51 PM

## 2020-04-14 DIAGNOSIS — K861 Other chronic pancreatitis: Secondary | ICD-10-CM | POA: Diagnosis not present

## 2020-04-14 DIAGNOSIS — K8500 Idiopathic acute pancreatitis without necrosis or infection: Secondary | ICD-10-CM | POA: Diagnosis not present

## 2020-04-14 LAB — BASIC METABOLIC PANEL
Anion gap: 7 (ref 5–15)
BUN: <5 mg/dL — ABNORMAL LOW (ref 6–20)
CO2: 24 mmol/L (ref 22–32)
Calcium: 8.2 mg/dL — ABNORMAL LOW (ref 8.9–10.3)
Chloride: 104 mmol/L (ref 98–111)
Creatinine, Ser: 1.13 mg/dL (ref 0.61–1.24)
GFR, Estimated: >60 mL/min (ref >60)
Glucose, Bld: 102 mg/dL — ABNORMAL HIGH (ref 70–99)
Potassium: 3.7 mmol/L (ref 3.5–5.1)
Sodium: 135 mmol/L (ref 135–145)

## 2020-04-14 LAB — CBC
HCT: 32.7 % — ABNORMAL LOW (ref 39.0–52.0)
Hemoglobin: 10.5 g/dL — ABNORMAL LOW (ref 13.0–17.0)
MCH: 30.3 pg (ref 26.0–34.0)
MCHC: 32.1 g/dL (ref 30.0–36.0)
MCV: 94.2 fL (ref 80.0–100.0)
Platelets: 229 10*3/uL (ref 150–400)
RBC: 3.47 MIL/uL — ABNORMAL LOW (ref 4.22–5.81)
RDW: 18.7 % — ABNORMAL HIGH (ref 11.5–15.5)
WBC: 6.7 10*3/uL (ref 4.0–10.5)
nRBC: 0 % (ref 0.0–0.2)

## 2020-04-14 MED ORDER — MORPHINE SULFATE (PF) 2 MG/ML IV SOLN
2.0000 mg | INTRAVENOUS | Status: DC | PRN
  Administered 2020-04-14 – 2020-04-15 (×5): 2 mg via INTRAVENOUS
  Filled 2020-04-14 (×6): qty 1

## 2020-04-14 MED ORDER — FAMOTIDINE 20 MG IN NS 100 ML IVPB
20.0000 mg | Freq: Two times a day (BID) | INTRAVENOUS | Status: DC
  Administered 2020-04-14 – 2020-04-15 (×2): 20 mg via INTRAVENOUS
  Filled 2020-04-14 (×3): qty 100
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
## 2020-04-14 NOTE — Progress Notes (Signed)
Progress Note    Kyle Berry  [id number] DOB: 05-23-1970  DOA: 04/12/2020 PCP: [name] (Inactive)    Brief Narrative:     Medical records reviewed and are as summarized below:  Kyle Berry is an 49 y.o. 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  Abdominal pain suddenly started last night, associated with strong feeling of nausea, the pain is epigastric located and radiated to the left side, cramping like, gradual getting worse.  Unable to eat and drink since last night and came to ED.  Denies any fever chills, but reported his bedding was soaked wet when he woke up in the middle of night because of the pain. Patient was diagnosed with pancreatic divisum two months ago, and he has scheduled to see GI specialist at [facility] in January.  Assessment/Plan:   Active Problems:   Chronic pancreatitis ([place])   Kidney stone   Acute pancreatitis   Pancreatitis   Acute pancreatitis, recurrent -Likely secondary to pancreatic divisum, symptom mild, BISAP=0 -failed POs-- worsening pain and nausea -NPO, pain control and IVF -continue to re-assess in AM prior to restarting POs -continues to complain about pain but is able to sleep and does not appear to be in pain  HTN -Hold ARB  Hypokalemia -Replace and recheck  Chronic pancreatic insufficiency -Continue Creon -has appointment with [facility] in January     Family Communication/Anticipated D/C date and plan/Code Status   DVT prophylaxis: scd Code Status: Full Code.  Disposition Plan: Status is: inpt  The patient will require care spanning > 2 midnights and should be moved to inpatient because: Inpatient level of care appropriate due to severity of illness  Dispo: The patient is  from: Home              Anticipated d/c is to: Home              Anticipated d/c date is: 2 days              Patient currently is not medically stable to d/c.needs to be able to eat         Medical Consultants:    None.    Subjective:   C/o pain when awoken  Objective:    Vitals:   04/13/20 1814 04/13/20 2348 04/14/20 0632 04/14/20 1159  BP: (!) 166/99 (!) 153/89 (!) 150/93 (!) 141/91  Pulse: 72 85 76   Resp: 16 18  18   Temp: 98.8 F (37.1 C) 99.5 F (37.5 C) 98.5 F (36.9 C)   TempSrc: Oral Oral Oral   SpO2: 100% 97% 98% 96%  Weight:      Height:        Intake/Output Summary (Last 24 hours) at 04/14/2020 1346 Last data filed at 04/13/2020 1500 Gross per 24 hour  Intake 852.24 ml  Output --  Net 852.24 ml   Filed Weights   04/12/20 0723  Weight: 79.4 kg    Exam:   General: Appearance:    Well developed, well nourished male in no acute distress   +BS, less tender to palpation in epigastric area  Lungs:     Clear to auscultation bilaterally, respirations unlabored  Heart:    Normal heart rate. Normal rhythm. No murmurs, rubs, or gallops.   MS:   All extremities are intact.   Neurologic:  Awake, alert, oriented x 3. No apparent focal neurological           defect.                        Data Reviewed:   I have personally reviewed following labs and imaging studies:  Labs: Labs show the following:   Basic Metabolic Panel: Recent Labs  Lab 04/12/20 0740 04/13/20 0052 04/14/20 0020  NA 138 139 135  K 3.3* 3.7 3.7  CL 102 106 104  CO2 24 25 24   GLUCOSE 111* 117* 102*  BUN 10 7 <5*  CREATININE 1.32* 1.16 1.13  CALCIUM 9.5 8.2* 8.2*  MG 2.0  --   --    GFR Estimated Creatinine Clearance: 87.8 mL/min (by C-G formula based on SCr of 1.13 mg/dL). Liver Function Tests: Recent Labs  Lab 04/12/20 0740 04/13/20 0052  AST 27 23  ALT 22 16  ALKPHOS 71 63  BILITOT 0.5 0.5  PROT 7.7 5.9*  ALBUMIN 3.6 2.8*   Recent Labs  Lab  04/12/20 0740 04/13/20 0052  LIPASE 73* 93*   No results for input(s): AMMONIA in the last 168 hours. Coagulation profile No results for input(s): INR, PROTIME in the last 168 hours.  CBC: Recent Labs  Lab 04/12/20 0740 04/13/20 0052 04/14/20 0020  WBC 9.2 7.0 6.7  HGB 13.1 10.8* 10.5*  HCT 41.5 34.2* 32.7*  MCV 93.7 95.5 94.2  PLT 259 241 229   Cardiac Enzymes: No results for input(s): CKTOTAL, CKMB, CKMBINDEX, TROPONINI in the last 168 hours. BNP (last 3 results) No results for input(s): PROBNP in the last 8760 hours. CBG: No results for input(s): GLUCAP in the last 168 hours. D-Dimer: No results for input(s): DDIMER in the last 72 hours. Hgb A1c: No results for input(s): HGBA1C in the last 72 hours. Lipid Profile: No results for input(s): CHOL, HDL, LDLCALC, TRIG, CHOLHDL, LDLDIRECT in the last 72 hours. Thyroid function studies: No results for input(s): TSH, T4TOTAL, T3FREE, THYROIDAB in the last 72 hours.  Invalid input(s): FREET3 Anemia work up: No results for input(s): VITAMINB12, FOLATE, FERRITIN, TIBC, IRON, RETICCTPCT in the last 72 hours. Sepsis Labs: Recent Labs  Lab 04/12/20 0740 04/13/20 0052 04/14/20 0020  WBC 9.2 7.0 6.7    Microbiology Recent Results (from the past 240 hour(s))  SARS CORONAVIRUS 2 (TAT 6-24 HRS) Nasopharyngeal Nasopharyngeal Swab     Status: None   Collection Time: 04/12/20 11:06 AM   Specimen: Nasopharyngeal Swab  Result Value Ref Range Status   SARS Coronavirus 2 NEGATIVE NEGATIVE Final    Comment: (NOTE) SARS-CoV-2 target nucleic acids are NOT DETECTED.  The SARS-CoV-2 RNA is generally detectable in upper and lower respiratory specimens during the acute phase of infection. Negative results do not preclude SARS-CoV-2 infection, do not rule out co-infections with other pathogens, and should not be used as the sole basis for treatment or other patient management decisions. Negative results must be combined with clinical  observations, patient history, and epidemiological information. The expected result is Negative.  Fact Sheet for Patients: 04/14/20  Fact Sheet for Healthcare Providers: HairSlick.no  This test is not yet approved or cleared by the [place] FDA and  has been authorized for detection and/or diagnosis of SARS-CoV-2 by FDA under an Emergency Use Authorization (EUA). This EUA will remain  in effect (meaning this test can be used) for the duration of the COVID-19 declaration under Se ction 564(b)(1) of the Act, 21  U.S.C. section 360bbb-3(b)(1), unless the authorization is terminated or revoked sooner.  Performed at [facility], [place] [address], [place], [place] [address]   Urine Culture     Status: None   Collection Time: 04/12/20  1:12 PM   Specimen: Urine, Random  Result Value Ref Range Status   Specimen Description URINE, RANDOM  Final   Special Requests NONE  Final   Culture   Final    NO GROWTH Performed at [facility], [place] [address], [place], [place] [address]    Report Status 04/13/2020 FINAL  Final    Procedures and diagnostic studies:  No results found.  Medications:   . cholecalciferol  400 Units Oral Daily  . enoxaparin (LOVENOX) injection  40 mg Subcutaneous Q24H  . famotidine  20 mg Oral BID  . lipase/protease/amylase  72,000 Units Oral TID WC   Continuous Infusions: . sodium chloride 125 mL/hr at 04/14/20 1025     LOS: 1 day   Geradine Girt  [facility]   How to contact the [facility] Attending or Consulting provider [place] or covering provider during after hours [place], for this patient?  1. Check the care team in [facility] and look for a) attending/consulting [facility] provider listed and b) the [facility] team listed 2. Log into [email or web address] and use [place]'s universal password to access. If you do not have the password, please contact the hospital operator. 3. Locate the [facility]  provider you are looking for under [facility] and page to a number that you can be directly reached. 4. If you still have difficulty reaching the provider, please page the [facility] (Director on Call) for the Hospitalists listed on amion for assistance.  04/14/2020, 1:46 PM

## 2020-04-15 DIAGNOSIS — K861 Other chronic pancreatitis: Secondary | ICD-10-CM | POA: Diagnosis not present

## 2020-04-15 DIAGNOSIS — K8500 Idiopathic acute pancreatitis without necrosis or infection: Secondary | ICD-10-CM | POA: Diagnosis not present

## 2020-04-15 LAB — CBC
HCT: 36.3 % — ABNORMAL LOW (ref 39.0–52.0)
Hemoglobin: 11.4 g/dL — ABNORMAL LOW (ref 13.0–17.0)
MCH: 30 pg (ref 26.0–34.0)
MCHC: 31.4 g/dL (ref 30.0–36.0)
MCV: 95.5 fL (ref 80.0–100.0)
Platelets: 228 10*3/uL (ref 150–400)
RBC: 3.8 MIL/uL — ABNORMAL LOW (ref 4.22–5.81)
RDW: 18.9 % — ABNORMAL HIGH (ref 11.5–15.5)
WBC: 4 10*3/uL (ref 4.0–10.5)
nRBC: 0 % (ref 0.0–0.2)

## 2020-04-15 LAB — BASIC METABOLIC PANEL
Anion gap: 13 (ref 5–15)
BUN: 5 mg/dL — ABNORMAL LOW (ref 6–20)
CO2: 20 mmol/L — ABNORMAL LOW (ref 22–32)
Calcium: 8.6 mg/dL — ABNORMAL LOW (ref 8.9–10.3)
Chloride: 103 mmol/L (ref 98–111)
Creatinine, Ser: 1.13 mg/dL (ref 0.61–1.24)
GFR, Estimated: >60 mL/min (ref >60)
Glucose, Bld: 104 mg/dL — ABNORMAL HIGH (ref 70–99)
Potassium: 4 mmol/L (ref 3.5–5.1)
Sodium: 136 mmol/L (ref 135–145)

## 2020-04-15 MED ORDER — OXYCODONE-ACETAMINOPHEN 5-325 MG PO TABS
1.0000 | ORAL_TABLET | Freq: Four times a day (QID) | ORAL | 0 refills | Status: DC | PRN
Start: 2020-04-15 — End: 2020-06-03

## 2020-04-15 NOTE — Plan of Care (Signed)
Adequate for discharge.

## 2020-04-15 NOTE — Discharge Summary (Signed)
Physician Discharge Summary  Kyle Berry [id number] DOB: 11-22-1970 DOA: 04/12/2020  PCP: [name] (Inactive)  Admit date: 04/12/2020 Discharge date: 04/15/2020  Admitted From: home Discharge disposition: home   Recommendations for Outpatient Follow-Up:   1. Would keep appointment at [facility]   Discharge Diagnosis:   Active Problems:   Chronic pancreatitis ([place])   Kidney stone   Acute pancreatitis   Pancreatitis    Discharge Condition: Improved.  Diet recommendation: Low sodium, heart healthy.   Wound care: None.  Code status: Full.   History of Present Illness:   Kyle Berry is a 49 y.o. 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  Abdominal pain suddenly started last night, associated with strong feeling of nausea, the pain is epigastric located and radiated to the left side, cramping like, gradual getting worse.  Unable to eat and drink since last night and came to ED.  Denies any fever chills, but reported his bedding was soaked wet when he woke up in the middle of night because of the pain.  Patient was diagnosed with pancreatic divisum two months ago, and he has scheduled to see GI specialist at [facility] in January.   Hospital Course by Problem:   Acute pancreatitis, recurrent -Likely secondary to pancreatic divisum, symptom mild, BISAP=0 - continues to deny eating [employer] -tolerating diet and asking to go home -would be cautious with narcotics  HTN -resume home meds  Hypokalemia -Replaced  Chronic pancreatic insufficiency -Continue Creon -has appointment with [facility] in January    Medical Consultants:      Discharge Exam:   Vitals:   04/15/20 0757 04/15/20 1150  BP: (!) 153/99 (!)  162/99  Pulse: 63 63  Resp: 16 18  Temp: 98.7 F (37.1 C) 98 F (36.7 C)  SpO2: 97% 98%   Vitals:   04/14/20 2149 04/15/20 0557 04/15/20 0757 04/15/20 1150  BP: (!) 155/93 (!) 139/93 (!) 153/99 (!) 162/99  Pulse: 73 69 63 63  Resp: 15 18 16 18   Temp: 99.1 F (37.3 C) 98.5 F (36.9 C) 98.7 F (37.1 C) 98 F (36.7 C)  TempSrc: Oral Oral Oral Oral  SpO2: 97% 97% 97% 98%  Weight:      Height:           The results of significant diagnostics from this hospitalization (including imaging, microbiology, ancillary and laboratory) are listed below for reference.     Procedures and Diagnostic Studies:   CT Renal Stone Study  Result Date: 04/12/2020 CLINICAL DATA:  Flank pain.  History of pancreatitis EXAM: CT ABDOMEN AND PELVIS WITHOUT CONTRAST TECHNIQUE: Multidetector CT imaging of the abdomen and pelvis was performed following the standard protocol without oral or IV contrast. COMPARISON:  January 29, 2020 and October 14, 2019. MR abdomen January 30, 2020 FINDINGS: Lower chest: Lung bases are clear. Hepatobiliary: No focal liver lesions are evident on this noncontrast enhanced study. Gallbladder is absent. There is no appreciable biliary duct dilatation. Pancreas: Calcification in the head of the pancreas again noted consistent with chronic pancreatitis change. There is soft tissue stranding surrounding much of the pancreas, similar to prior study. Fluid tracks along the posterior leftward aspect of the pancreas to the left lateral conal fascia, similar to prior study. There is no well-defined mass or pseudocyst. Pancreatic duct upper normal in  size, stable. Spleen: No splenic lesions are evident. Adrenals/Urinary Tract: Adrenals bilaterally appear normal. There is severe chronic atrophy of the right kidney with only a small amount of residual renal appearing tissue noted on the right, unchanged. Compensatory hypertrophy of the left kidney noted. No left renal mass or hydronephrosis evident.  There is a calculus in the lower pole of the left kidney measuring 5 x 5 mm with a nearby 4 x 2 mm calculus in the lower pole of the left kidney. No evident ureteral calculus. Urinary bladder is midline with wall thickness within normal limits. Stomach/Bowel: There is no appreciable bowel wall or mesenteric thickening. Terminal ileum appears normal. No evident bowel obstruction. There is no appreciable free air or portal venous air. Vascular/Lymphatic: No abdominal aortic aneurysm. No vascular lesions are appreciable on this noncontrast enhanced study. There is no evident adenopathy in the abdomen or pelvis. Reproductive: Prostate and seminal vesicles are normal in size and contour. Other: Appendix appears normal. No abscess or ascites evident in the abdomen or pelvis. Musculoskeletal: There is degenerative change in the lower lumbar spine. No blastic or lytic bone lesions. No intramuscular or abdominal wall lesions are evident. IMPRESSION: 1. Soft tissue stranding surrounding portions of the pancreas with mild fluid tracking posterior to the pancreas to the left lateral conal fascia, similar to prior study and consistent with a degree of acute pancreatitis. No mass or pseudocyst evident. Pancreatic duct upper normal in size. Calcification in the head of the pancreas indicative of underlying chronic pancreatitis. 2. Marked atrophy of right kidney, stable, with compensatory hypertrophy left kidney. Nonobstructing calculi lower pole left kidney noted, largest measuring 5 x 5 mm. No hydronephrosis or ureteral calculus present 1. Urinary bladder wall thickness normal. 3. No bowel obstruction. No abscess in the abdomen or pelvis. Appendix appears normal. 4.  Gallbladder absent. Electronically Signed   By: Bretta Bang III M.D.   On: 04/12/2020 11:03     Labs:   Basic Metabolic Panel: Recent Labs  Lab 04/12/20 0740 04/13/20 0052 04/14/20 0020 04/15/20 0147  NA 138 139 135 136  K 3.3* 3.7 3.7 4.0  CL  102 106 104 103  CO2 24 25 24  20*  GLUCOSE 111* 117* 102* 104*  BUN 10 7 <5* 5*  CREATININE 1.32* 1.16 1.13 1.13  CALCIUM 9.5 8.2* 8.2* 8.6*  MG 2.0  --   --   --    GFR Estimated Creatinine Clearance: 87.8 mL/min (by C-G formula based on SCr of 1.13 mg/dL). Liver Function Tests: Recent Labs  Lab 04/12/20 0740 04/13/20 0052  AST 27 23  ALT 22 16  ALKPHOS 71 63  BILITOT 0.5 0.5  PROT 7.7 5.9*  ALBUMIN 3.6 2.8*   Recent Labs  Lab 04/12/20 0740 04/13/20 0052  LIPASE 73* 93*   No results for input(s): AMMONIA in the last 168 hours. Coagulation profile No results for input(s): INR, PROTIME in the last 168 hours.  CBC: Recent Labs  Lab 04/12/20 0740 04/13/20 0052 04/14/20 0020 04/15/20 0147  WBC 9.2 7.0 6.7 4.0  HGB 13.1 10.8* 10.5* 11.4*  HCT 41.5 34.2* 32.7* 36.3*  MCV 93.7 95.5 94.2 95.5  PLT 259 241 229 228   Cardiac Enzymes: No results for input(s): CKTOTAL, CKMB, CKMBINDEX, TROPONINI in the last 168 hours. BNP: Invalid input(s): POCBNP CBG: No results for input(s): GLUCAP in the last 168 hours. D-Dimer No results for input(s): DDIMER in the last 72 hours. Hgb A1c No results for input(s): HGBA1C in  the last 72 hours. Lipid Profile No results for input(s): CHOL, HDL, LDLCALC, TRIG, CHOLHDL, LDLDIRECT in the last 72 hours. Thyroid function studies No results for input(s): TSH, T4TOTAL, T3FREE, THYROIDAB in the last 72 hours.  Invalid input(s): FREET3 Anemia work up No results for input(s): VITAMINB12, FOLATE, FERRITIN, TIBC, IRON, RETICCTPCT in the last 72 hours. Microbiology Recent Results (from the past 240 hour(s))  SARS CORONAVIRUS 2 (TAT 6-24 HRS) Nasopharyngeal Nasopharyngeal Swab     Status: None   Collection Time: 04/12/20 11:06 AM   Specimen: Nasopharyngeal Swab  Result Value Ref Range Status   SARS Coronavirus 2 NEGATIVE NEGATIVE Final    Comment: (NOTE) SARS-CoV-2 target nucleic acids are NOT DETECTED.  The SARS-CoV-2 RNA is generally  detectable in upper and lower respiratory specimens during the acute phase of infection. Negative results do not preclude SARS-CoV-2 infection, do not rule out co-infections with other pathogens, and should not be used as the sole basis for treatment or other patient management decisions. Negative results must be combined with clinical observations, patient history, and epidemiological information. The expected result is Negative.  Fact Sheet for Patients: HairSlick.no  Fact Sheet for Healthcare Providers: quierodirigir.com  This test is not yet approved or cleared by the [place] FDA and  has been authorized for detection and/or diagnosis of SARS-CoV-2 by FDA under an Emergency Use Authorization (EUA). This EUA will remain  in effect (meaning this test can be used) for the duration of the COVID-19 declaration under Se ction 564(b)(1) of the Act, 21 U.S.C. section 360bbb-3(b)(1), unless the authorization is terminated or revoked sooner.  Performed at [facility], [address] [place], [place] [address]   Urine Culture     Status: None   Collection Time: 04/12/20  1:12 PM   Specimen: Urine, Random  Result Value Ref Range Status   Specimen Description URINE, RANDOM  Final   Special Requests NONE  Final   Culture   Final    NO GROWTH Performed at [facility], [address] [place], [place] [address]    Report Status 04/13/2020 FINAL  Final     Discharge Instructions:   Discharge Instructions    Diet - low sodium heart healthy   Complete by: As directed    Increase activity slowly   Complete by: As directed      Allergies as of 04/15/2020      Reactions   Diclofenac Hives   Norvasc [amlodipine Besylate] Other (See Comments)   Fluid buildup in chest   Omeprazole Other (See Comments)   MD stopped due to pancreatitis   Prednisone Other (See Comments)   Mood swings      Medication List     TAKE these medications   famotidine 20 MG tablet Commonly known as: PEPCID Take 1 tablet (20 mg total) by mouth 2 (two) times daily.   lipase/protease/amylase 26948 UNITS Cpep capsule Commonly known as: CREON Take 2 capsules (72,000 Units total) by mouth 3 (three) times daily with meals. What changed: Another medication with the same name was removed. Continue taking this medication, and follow the directions you see here.   losartan 25 MG tablet Commonly known as: COZAAR Take 25 mg by mouth daily.   oxyCODONE-acetaminophen 5-325 MG tablet Commonly known as: PERCOCET/ROXICET Take 1-2 tablets by mouth every 6 (six) hours as needed for moderate pain or severe pain.   promethazine 25 MG tablet Commonly known as: PHENERGAN Take 25 mg by mouth every  6 (six) hours as needed for nausea or vomiting.   VITAMIN D PO Take 1 tablet by mouth daily.       Follow-up Information    [name], [name] Follow up in 1 week(s).   Contact information: [place] [facility] [place] [address] [phone]                Time coordinating discharge: 35 min  Signed:  Geradine Girt DO  [facility] 04/15/2020, 4:15 PM

## 2020-04-15 NOTE — Progress Notes (Signed)
Progress Note    Kyle Berry  [id number] DOB: 1970-04-30  DOA: 04/12/2020 PCP: [name] (Inactive)    Brief Narrative:     Medical records reviewed and are as summarized below:  Kyle Berry is an 49 y.o. 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  Abdominal pain suddenly started last night, associated with strong feeling of nausea, the pain is epigastric located and radiated to the left side, cramping like, gradual getting worse.  Unable to eat and drink since last night and came to ED.  Denies any fever chills, but reported his bedding was soaked wet when he woke up in the middle of night because of the pain. Patient was diagnosed with pancreatic divisum two months ago, and he has scheduled to see GI specialist at [facility] in January.  Assessment/Plan:   Active Problems:   Chronic pancreatitis ([place])   Kidney stone   Acute pancreatitis   Pancreatitis   Acute pancreatitis, recurrent -Likely secondary to pancreatic divisum, symptom mild, BISAP=0 -continues to complain about pain but is able to sleep and does not appear to be in pain, up walking, continues to deny eating [employer] -will start full liquids and plan for d/c in AM  HTN -Hold ARB  Hypokalemia -Replace and recheck  Chronic pancreatic insufficiency -Continue Creon -has appointment with [facility] in January     Family Communication/Anticipated D/C date and plan/Code Status   DVT prophylaxis: scd Code Status: Full Code.  Disposition Plan: Status is: inpt  The patient will require care spanning > 2 midnights and should be moved to inpatient because: Inpatient level of care appropriate due to severity of illness  Dispo: The patient is from: Home               Anticipated d/c is to: Home              Anticipated d/c date is: in AM              Patient currently is not medically stable to d/c.         Medical Consultants:    None.    Subjective:   Up walking around room.  Pain present but lessened  Objective:    Vitals:   04/14/20 2149 04/15/20 0557 04/15/20 0757 04/15/20 1150  BP: (!) 155/93 (!) 139/93 (!) 153/99 (!) 162/99  Pulse: 73 69 63 63  Resp: 15 18 16 18   Temp: 99.1 F (37.3 C) 98.5 F (36.9 C) 98.7 F (37.1 C) 98 F (36.7 C)  TempSrc: Oral Oral Oral Oral  SpO2: 97% 97% 97% 98%  Weight:      Height:        Intake/Output Summary (Last 24 hours) at 04/15/2020 1606 Last data filed at 04/15/2020 0300 Gross per 24 hour  Intake 4141.18 ml  Output --  Net 4141.18 ml   Filed Weights   04/12/20 0723  Weight: 79.4 kg    Exam:   General: Appearance:    Well developed, well nourished male in no acute distress     Lungs:     Clear to auscultation bilaterally, respirations unlabored  Heart:    Normal heart rate. Normal rhythm. No murmurs, rubs, or gallops.   MS:   All extremities are intact.   Neurologic:   Awake, alert, oriented x 3.  No apparent focal neurological           defect.                        Data Reviewed:   I have personally reviewed following labs and imaging studies:  Labs: Labs show the following:   Basic Metabolic Panel: Recent Labs  Lab 04/12/20 0740 04/13/20 0052 04/14/20 0020 04/15/20 0147  NA 138 139 135 136  K 3.3* 3.7 3.7 4.0  CL 102 106 104 103  CO2 24 25 24  20*  GLUCOSE 111* 117* 102* 104*  BUN 10 7 <5* 5*  CREATININE 1.32* 1.16 1.13 1.13  CALCIUM 9.5 8.2* 8.2* 8.6*  MG 2.0  --   --   --    GFR Estimated Creatinine Clearance: 87.8 mL/min (by C-G formula based on SCr of 1.13 mg/dL). Liver Function Tests: Recent Labs  Lab 04/12/20 0740 04/13/20 0052  AST 27 23  ALT 22 16  ALKPHOS 71 63  BILITOT 0.5 0.5  PROT 7.7 5.9*  ALBUMIN 3.6 2.8*   Recent  Labs  Lab 04/12/20 0740 04/13/20 0052  LIPASE 73* 93*   No results for input(s): AMMONIA in the last 168 hours. Coagulation profile No results for input(s): INR, PROTIME in the last 168 hours.  CBC: Recent Labs  Lab 04/12/20 0740 04/13/20 0052 04/14/20 0020 04/15/20 0147  WBC 9.2 7.0 6.7 4.0  HGB 13.1 10.8* 10.5* 11.4*  HCT 41.5 34.2* 32.7* 36.3*  MCV 93.7 95.5 94.2 95.5  PLT 259 241 229 228   Cardiac Enzymes: No results for input(s): CKTOTAL, CKMB, CKMBINDEX, TROPONINI in the last 168 hours. BNP (last 3 results) No results for input(s): PROBNP in the last 8760 hours. CBG: No results for input(s): GLUCAP in the last 168 hours. D-Dimer: No results for input(s): DDIMER in the last 72 hours. Hgb A1c: No results for input(s): HGBA1C in the last 72 hours. Lipid Profile: No results for input(s): CHOL, HDL, LDLCALC, TRIG, CHOLHDL, LDLDIRECT in the last 72 hours. Thyroid function studies: No results for input(s): TSH, T4TOTAL, T3FREE, THYROIDAB in the last 72 hours.  Invalid input(s): FREET3 Anemia work up: No results for input(s): VITAMINB12, FOLATE, FERRITIN, TIBC, IRON, RETICCTPCT in the last 72 hours. Sepsis Labs: Recent Labs  Lab 04/12/20 0740 04/13/20 0052 04/14/20 0020 04/15/20 0147  WBC 9.2 7.0 6.7 4.0    Microbiology Recent Results (from the past 240 hour(s))  SARS CORONAVIRUS 2 (TAT 6-24 HRS) Nasopharyngeal Nasopharyngeal Swab     Status: None   Collection Time: 04/12/20 11:06 AM   Specimen: Nasopharyngeal Swab  Result Value Ref Range Status   SARS Coronavirus 2 NEGATIVE NEGATIVE Final    Comment: (NOTE) SARS-CoV-2 target nucleic acids are NOT DETECTED.  The SARS-CoV-2 RNA is generally detectable in upper and lower respiratory specimens during the acute phase of infection. Negative results do not preclude SARS-CoV-2 infection, do not rule out co-infections with other pathogens, and should not be used as the sole basis for treatment or other patient  management decisions. Negative results must be combined with clinical observations, patient history, and epidemiological information. The expected result is Negative.  Fact Sheet for Patients: HairSlick.nohttps://www.fda.gov/media/138098/download  Fact Sheet for Healthcare Providers: quierodirigir.comhttps://www.fda.gov/media/138095/download  This test is not yet approved or cleared by the [place] FDA and  has been authorized for detection and/or diagnosis of SARS-CoV-2 by FDA under an Emergency Use Authorization (EUA). This EUA will remain  in effect (meaning this test  can be used) for the duration of the COVID-19 declaration under Se ction 564(b)(1) of the Act, 21 U.S.C. section 360bbb-3(b)(1), unless the authorization is terminated or revoked sooner.  Performed at [facility], [address] [place], [place] [address]   Urine Culture     Status: None   Collection Time: 04/12/20  1:12 PM   Specimen: Urine, Random  Result Value Ref Range Status   Specimen Description URINE, RANDOM  Final   Special Requests NONE  Final   Culture   Final    NO GROWTH Performed at [facility], [address], [place], [place] [address]    Report Status 04/13/2020 FINAL  Final    Procedures and diagnostic studies:  No results found.  Medications:   . cholecalciferol  400 Units Oral Daily  . enoxaparin (LOVENOX) injection  40 mg Subcutaneous Q24H  . famotidine (PEPCID) IV  20 mg Intravenous Q12H  . lipase/protease/amylase  72,000 Units Oral TID WC   Continuous Infusions:    LOS: 2 days   Joseph Art  [facility]   How to contact the [facility] Attending or Consulting provider 7A - 7P or covering provider during after hours 7P -7A, for this patient?  1. Check the care team in [facility] and look for a) attending/consulting TR[facility] provider listed and b) the [facility] team listed 2. Log into [email or web address] and use [place]'s universal password to access. If you do not have the password, please contact  the hospital operator. 3. Locate the [facility] provider you are looking for under [facility] and page to a number that you can be directly reached. 4. If you still have difficulty reaching the provider, please page the [facility] (Director on Call) for the Hospitalists listed on amion for assistance.  04/15/2020, 4:06 PM

## 2020-05-29 ENCOUNTER — Other Ambulatory Visit: Payer: Self-pay

## 2020-05-29 ENCOUNTER — Inpatient Hospital Stay (HOSPITAL_COMMUNITY)
Admission: EM | Admit: 2020-05-29 | Discharge: 2020-06-03 | DRG: 439 | Disposition: A | Discharge status: Home / Self Care | Payer: Managed Care, Other (non HMO) | Attending: Internal Medicine | Admitting: Internal Medicine

## 2020-05-29 ENCOUNTER — Encounter (HOSPITAL_COMMUNITY): Payer: Self-pay | Admitting: *Deleted

## 2020-05-29 ENCOUNTER — Observation Stay (HOSPITAL_COMMUNITY): Payer: Managed Care, Other (non HMO)

## 2020-05-29 DIAGNOSIS — K859 Acute pancreatitis without necrosis or infection, unspecified: Secondary | ICD-10-CM | POA: Diagnosis not present

## 2020-05-29 DIAGNOSIS — K861 Other chronic pancreatitis: Principal | ICD-10-CM | POA: Diagnosis present

## 2020-05-29 DIAGNOSIS — Z833 Family history of diabetes mellitus: Secondary | ICD-10-CM

## 2020-05-29 DIAGNOSIS — N181 Chronic kidney disease, stage 1: Secondary | ICD-10-CM | POA: Diagnosis present

## 2020-05-29 DIAGNOSIS — Z841 Family history of disorders of kidney and ureter: Secondary | ICD-10-CM

## 2020-05-29 DIAGNOSIS — Z800 Family history of malignant neoplasm of digestive organs: Secondary | ICD-10-CM

## 2020-05-29 DIAGNOSIS — I129 Hypertensive chronic kidney disease with stage 1 through stage 4 chronic kidney disease, or unspecified chronic kidney disease: Secondary | ICD-10-CM | POA: Diagnosis present

## 2020-05-29 DIAGNOSIS — Z20822 Contact with and (suspected) exposure to covid-19: Secondary | ICD-10-CM | POA: Diagnosis present

## 2020-05-29 DIAGNOSIS — E785 Hyperlipidemia, unspecified: Secondary | ICD-10-CM | POA: Diagnosis present

## 2020-05-29 DIAGNOSIS — E7800 Pure hypercholesterolemia, unspecified: Secondary | ICD-10-CM | POA: Diagnosis present

## 2020-05-29 DIAGNOSIS — Z888 Allergy status to other drugs, medicaments and biological substances status: Secondary | ICD-10-CM

## 2020-05-29 DIAGNOSIS — Z9049 Acquired absence of other specified parts of digestive tract: Secondary | ICD-10-CM

## 2020-05-29 DIAGNOSIS — Z8249 Family history of ischemic heart disease and other diseases of the circulatory system: Secondary | ICD-10-CM

## 2020-05-29 DIAGNOSIS — K219 Gastro-esophageal reflux disease without esophagitis: Secondary | ICD-10-CM | POA: Diagnosis not present

## 2020-05-29 DIAGNOSIS — Z79899 Other long term (current) drug therapy: Secondary | ICD-10-CM

## 2020-05-29 DIAGNOSIS — Q600 Renal agenesis, unilateral: Secondary | ICD-10-CM

## 2020-05-29 DIAGNOSIS — Z87442 Personal history of urinary calculi: Secondary | ICD-10-CM

## 2020-05-29 DIAGNOSIS — Z8719 Personal history of other diseases of the digestive system: Secondary | ICD-10-CM

## 2020-05-29 DIAGNOSIS — Q453 Other congenital malformations of pancreas and pancreatic duct: Secondary | ICD-10-CM

## 2020-05-29 LAB — CBC
HCT: 44 % (ref 39.0–52.0)
Hemoglobin: 14.2 g/dL (ref 13.0–17.0)
MCH: 30.5 pg (ref 26.0–34.0)
MCHC: 32.3 g/dL (ref 30.0–36.0)
MCV: 94.6 fL (ref 80.0–100.0)
Platelets: 235 10*3/uL (ref 150–400)
RBC: 4.65 MIL/uL (ref 4.22–5.81)
RDW: 17.2 % — ABNORMAL HIGH (ref 11.5–15.5)
WBC: 10.5 10*3/uL (ref 4.0–10.5)
nRBC: 0 % (ref 0.0–0.2)

## 2020-05-29 LAB — COMPREHENSIVE METABOLIC PANEL
ALT: 32 U/L (ref 0–44)
AST: 48 U/L — ABNORMAL HIGH (ref 15–41)
Albumin: 4 g/dL (ref 3.5–5.0)
Alkaline Phosphatase: 65 U/L (ref 38–126)
Anion gap: 15 (ref 5–15)
BUN: 7 mg/dL (ref 6–20)
CO2: 20 mmol/L — ABNORMAL LOW (ref 22–32)
Calcium: 9.5 mg/dL (ref 8.9–10.3)
Chloride: 101 mmol/L (ref 98–111)
Creatinine, Ser: 1.21 mg/dL (ref 0.61–1.24)
GFR, Estimated: >60 mL/min (ref >60)
Glucose, Bld: 151 mg/dL — ABNORMAL HIGH (ref 70–99)
Potassium: 3.4 mmol/L — ABNORMAL LOW (ref 3.5–5.1)
Sodium: 136 mmol/L (ref 135–145)
Total Bilirubin: 2 mg/dL — ABNORMAL HIGH (ref 0.3–1.2)
Total Protein: 8 g/dL (ref 6.5–8.1)

## 2020-05-29 LAB — RESP PANEL BY RT-PCR (FLU A&B, COVID) ARPGX2
Influenza A by PCR: NEGATIVE
Influenza B by PCR: NEGATIVE
SARS Coronavirus 2 by RT PCR: NEGATIVE

## 2020-05-29 LAB — URINALYSIS, ROUTINE W REFLEX MICROSCOPIC
Glucose, UA: NEGATIVE mg/dL
Hgb urine dipstick: NEGATIVE
Ketones, ur: NEGATIVE mg/dL
Leukocytes,Ua: NEGATIVE
Nitrite: POSITIVE — AB
Protein, ur: 100 mg/dL — AB
Specific Gravity, Urine: >1.03 — ABNORMAL HIGH (ref 1.005–1.030)
pH: 5 (ref 5.0–8.0)

## 2020-05-29 LAB — URINALYSIS, MICROSCOPIC (REFLEX)

## 2020-05-29 LAB — LIPASE, BLOOD: Lipase: 526 U/L — ABNORMAL HIGH (ref 11–51)

## 2020-05-29 MED ORDER — HYDROMORPHONE HCL 1 MG/ML IJ SOLN
1.0000 mg | INTRAMUSCULAR | Status: DC | PRN
  Administered 2020-05-29 – 2020-06-01 (×17): 1 mg via INTRAVENOUS
  Filled 2020-05-29 (×17): qty 1

## 2020-05-29 MED ORDER — PROMETHAZINE HCL 25 MG/ML IJ SOLN
25.0000 mg | Freq: Four times a day (QID) | INTRAMUSCULAR | Status: DC | PRN
  Administered 2020-05-29 – 2020-06-02 (×11): 25 mg via INTRAVENOUS
  Filled 2020-05-29 (×12): qty 1

## 2020-05-29 MED ORDER — LOSARTAN POTASSIUM 25 MG PO TABS
25.0000 mg | ORAL_TABLET | Freq: Every day | ORAL | Status: DC
  Administered 2020-05-29 – 2020-06-03 (×6): 25 mg via ORAL
  Filled 2020-05-29 (×6): qty 1

## 2020-05-29 MED ORDER — PROMETHAZINE HCL 25 MG PO TABS
25.0000 mg | ORAL_TABLET | Freq: Four times a day (QID) | ORAL | Status: DC | PRN
  Administered 2020-05-29: 25 mg via ORAL
  Filled 2020-05-29: qty 1

## 2020-05-29 MED ORDER — LACTATED RINGERS IV BOLUS
1000.0000 mL | Freq: Once | INTRAVENOUS | Status: AC
  Administered 2020-05-29: 1000 mL via INTRAVENOUS

## 2020-05-29 MED ORDER — ENOXAPARIN SODIUM 40 MG/0.4ML ~~LOC~~ SOLN
40.0000 mg | SUBCUTANEOUS | Status: DC
  Administered 2020-05-29 – 2020-06-02 (×5): 40 mg via SUBCUTANEOUS
  Filled 2020-05-29 (×6): qty 0.4

## 2020-05-29 MED ORDER — PANCRELIPASE (LIP-PROT-AMYL) 36000-114000 UNITS PO CPEP: 36000.0000 [IU] | ORAL_CAPSULE | Freq: Three times a day (TID) | ORAL | Status: DC

## 2020-05-29 MED ORDER — SODIUM CHLORIDE 0.9% FLUSH
3.0000 mL | Freq: Two times a day (BID) | INTRAVENOUS | Status: DC
  Administered 2020-05-29 – 2020-06-03 (×11): 3 mL via INTRAVENOUS

## 2020-05-29 MED ORDER — MORPHINE SULFATE (PF) 4 MG/ML IV SOLN
4.0000 mg | Freq: Once | INTRAVENOUS | Status: AC
  Administered 2020-05-29: 4 mg via INTRAVENOUS
  Filled 2020-05-29: qty 1

## 2020-05-29 MED ORDER — ONDANSETRON HCL 4 MG/2ML IJ SOLN
4.0000 mg | Freq: Once | INTRAMUSCULAR | Status: AC
  Administered 2020-05-29: 4 mg via INTRAVENOUS
  Filled 2020-05-29: qty 2

## 2020-05-29 MED ORDER — FAMOTIDINE 20 MG PO TABS
20.0000 mg | ORAL_TABLET | Freq: Two times a day (BID) | ORAL | Status: DC
  Administered 2020-05-29 – 2020-06-03 (×11): 20 mg via ORAL
  Filled 2020-05-29 (×11): qty 1

## 2020-05-29 MED ORDER — ONDANSETRON HCL 4 MG/2ML IJ SOLN
4.0000 mg | Freq: Once | INTRAMUSCULAR | Status: DC
  Filled 2020-05-29: qty 2

## 2020-05-29 MED ORDER — ONDANSETRON HCL 4 MG PO TABS: 4.0000 mg | ORAL_TABLET | Freq: Once | ORAL | Status: DC

## 2020-05-29 NOTE — ED Notes (Signed)
Attempted to give report and was told the nurse is busy and will call back.

## 2020-05-29 NOTE — ED Provider Notes (Signed)
[facility] EMERGENCY DEPARTMENT Provider Note   CSN: [id number] Arrival date & time: 05/29/20  3559     History Chief Complaint  Patient presents with  . Abdominal Pain    Kyle Berry is a 50 y.o. male.  HPI   50 year old male presents today complaining of pancreatitis. He has a history of pancreatitis with pancreatic device and. He is followed at [facility] now. He states he was seen at [facility] 2 weeks ago and at that time he was told his lipase was slightly elevated. Since that time, he has had increasing nausea, vomiting, and pain. He states it has been severe the past 2 days. Today patient reports that he vomited up green material. He has not had fever or chills. He has had his Covid vaccines and his booster shot. He denies any nasal congestion, sore throat, cough, or chest pain. He reports decreased urine output.  Past Medical History:  Diagnosis Date  . Chronic bronchitis ([place])   . Chronic lower back pain   . Congenital single kidney   . GERD (gastroesophageal reflux disease)   . Headache    "once/month" (05/13/2017)  . High cholesterol   . Hypertension   . Migraine    "a couple/year" (05/13/2017)  . Nephrolithiasis 05/13/2017  . Pancreatitis   . Pneumonia ~ 09/2015  . Recurrent acute pancreatitis     Patient Active Problem List   Diagnosis Date Noted  . Pancreatitis 04/12/2020  . Urinary tract infection without hematuria   . Symptomatic anemia 11/05/2019  . Acute lower GI bleeding 11/02/2019  . Near syncope 11/02/2019  . AKI (acute kidney injury) ([place]) 11/02/2019  . Colonic ulcer   . Rectal bleeding 10/14/2019  . Pancreatic insufficiency 05/28/2019  . Recurrent acute pancreatitis 10/17/2018  . Microscopic hematuria 10/17/2018  . Acute pancreatitis 07/23/2018  . Pancreatic divisum   . Hematemesis 10/18/2017  . Recurrent pancreatitis 05/13/2017  . Congenital single kidney 05/13/2017  . High cholesterol 05/13/2017  . Kidney stone 05/13/2017   . Acute on chronic pancreatitis ([place]) 01/17/2017  . Hypokalemia   . Nausea and vomiting   . Essential hypertension   . Esophageal reflux   . Chronic pancreatitis ([place]) 11/23/2015    Past Surgical History:  Procedure Laterality Date  . CHOLECYSTECTOMY N/A 10/23/2017   Procedure: LAPAROSCOPIC CHOLECYSTECTOMY WITH INTRAOPERATIVE CHOLANGIOGRAM;  Surgeon: Johnathan Hausen, MD;  Location: [facility] ORS;  Service: General;  Laterality: N/A;  . COLONOSCOPY WITH PROPOFOL N/A 11/02/2019   Procedure: COLONOSCOPY WITH PROPOFOL;  Surgeon: Irene Shipper, MD;  Location: [facility] ENDOSCOPY;  Service: Endoscopy;  Laterality: N/A;  . ESOPHAGOGASTRODUODENOSCOPY (EGD) WITH PROPOFOL N/A 11/06/2019   Procedure: ESOPHAGOGASTRODUODENOSCOPY (EGD) WITH PROPOFOL;  Surgeon: Irene Shipper, MD;  Location: [facility] ENDOSCOPY;  Service: Endoscopy;  Laterality: N/A;  . HEMOSTASIS CLIP PLACEMENT  11/02/2019   Procedure: HEMOSTASIS CLIP PLACEMENT;  Surgeon: Irene Shipper, MD;  Location: [facility] ENDOSCOPY;  Service: Endoscopy;;  . NO PAST SURGERIES         Family History  Problem Relation Age of Onset  . Hypertension Other   . Hypertension Mother   . Hypertension Father   . Kidney disease Father   . Hypertension Sister   . Diabetes Sister   . Hypertension Brother   . Pancreatic cancer Paternal Grandmother   . Colon cancer Cousin   . Stomach cancer Neg Hx   . Esophageal cancer Neg Hx     Social History   Tobacco Use  . Smoking  status: Never Smoker  . Smokeless tobacco: Never Used  Vaping Use  . Vaping Use: Never used  Substance Use Topics  . Alcohol use: Not Currently    Comment: 05/13/2017 "might have beer a few times/year"  . Drug use: No    Home Medications Prior to Admission medications   Medication Sig Start Date End Date Taking? Authorizing Provider  famotidine (PEPCID) 20 MG tablet Take 1 tablet (20 mg total) by mouth 2 (two) times daily. 02/02/20   Mikhail, Velta Addison, DO  lipase/protease/amylase (CREON) 36000 UNITS CPEP  capsule Take 2 capsules (72,000 Units total) by mouth 3 (three) times daily with meals. 02/02/20   Mikhail, Velta Addison, DO  losartan (COZAAR) 25 MG tablet Take 25 mg by mouth daily. 03/17/20   [provider]  oxyCODONE-acetaminophen (PERCOCET/ROXICET) 5-325 MG tablet Take 1-2 tablets by mouth every 6 (six) hours as needed for moderate pain or severe pain. 04/15/20   Geradine Girt, DO  promethazine (PHENERGAN) 25 MG tablet Take 25 mg by mouth every 6 (six) hours as needed for nausea or vomiting.    [provider]  VITAMIN D PO Take 1 tablet by mouth daily.    [provider]    Allergies    Diclofenac, Norvasc [amlodipine besylate], Omeprazole, and Prednisone  Review of Systems   Review of Systems  Constitutional: Positive for appetite change.  HENT: Negative.   Eyes: Negative.   Respiratory: Negative.   Cardiovascular: Negative.   Gastrointestinal: Positive for abdominal pain, nausea and vomiting.  Endocrine: Negative.   Genitourinary: Positive for decreased urine volume.  Musculoskeletal: Negative.   Skin: Negative.   Allergic/Immunologic: Negative.   Neurological: Negative.   Hematological: Negative.   Psychiatric/Behavioral: Negative.   All other systems reviewed and are negative.   Physical Exam Updated Vital Signs BP (!) 139/107 (BP Location: Right Arm)   Pulse 100   Temp (!) 97.5 F (36.4 C) (Oral)   Resp 16   Ht 1.854 m (6\' 1" )   Wt 79.4 kg   SpO2 98%   BMI 23.09 kg/m   Physical Exam  ED Results / Procedures / Treatments   Labs (all labs ordered are listed, but only abnormal results are displayed) Labs Reviewed  CBC - Abnormal; Notable for the following components:      Result Value   RDW 17.2 (*)    All other components within normal limits  RESP PANEL BY RT-PCR (FLU A&B, COVID) ARPGX2  LIPASE, BLOOD  COMPREHENSIVE METABOLIC PANEL  URINALYSIS, ROUTINE W REFLEX MICROSCOPIC  LIPASE, BLOOD    EKG EKG  Interpretation  Date/Time:  Sunday May 29 2020 07:52:56 EST Ventricular Rate:  73 PR Interval:    QRS Duration: 82 QT Interval:  374 QTC Calculation: 413 R Axis:   48 Text Interpretation: Sinus rhythm Non-specific ST-t changes Anterior leads Confirmed by Pattricia Boss [phone]) on 05/29/2020 8:46:48 AM   Radiology No results found.  Procedures Procedures   Medications Ordered in ED Medications  lactated ringers bolus 1,000 mL (has no administration in time range)    ED Course  I have reviewed the triage vital signs and the nursing notes.  Pertinent labs & imaging results that were available during my care of the patient were reviewed by me and considered in my medical decision making (see chart for details).    MDM Rules/Calculators/A&P  50 yo male with ho pancreatitis presents today with nausea, vomiting, and epigastric pain consistent with his pancreatitis.  Labs obtained here reveal lipase 526.  Patient is mildly hyperglycemic at 150.  CO2 is 20.  Potassium is 3.4.  Is receiving IV fluids, antiemetics, and pain medicine.  Plan admission for ongoing fluids and pain management.  Discussed was teaching service and will see for admission Final Clinical Impression(s) / ED Diagnoses Final diagnoses:  Chronic pancreatitis, unspecified pancreatitis type [facility])    Rx / DC Orders ED Discharge Orders    None       Pattricia Boss, MD 05/29/20 0945

## 2020-05-29 NOTE — H&P (Signed)
Date: 05/29/2020               Patient Name:  Kyle Berry MRN: [id number]  DOB: 1970-06-04 Age / Sex: 50 y.o., male   PCP: [name] (Inactive)         Medical Service: Internal Medicine Teaching Service         Attending Physician: Dr. Aldine Contes, MD    First Contact: Dr. Iona Beard Pager: [phone]  Second Contact: Dr. 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 He has a history of recurrent pancreatitis and notes that current episode started about two days ago. He endorses epigastric pain radiating to bilateral upper abdomen and back. He also is endorsing some chest pain. Endorses nausea and vomiting with multiple episodes of bilious emesis and inability to tolerate oral intake. He notes taking percocet that did not help with the pain and oxycodone which made his stomach hurt worse. Endorses abdominal bloating. He endorses chills as well but denies fevers, dysuria, and hematemesis.  Of note, patient is establishing care at [facility] for his recurrent pancreatitis.  Was supposed to have MRCP yesterday  ED course: Elevated of lipase of 526. K of 3.4, AST 48 and T. Bili of 2.0.. Received LR 1L, morphine and zofran.  Meds:  Current Meds  Medication Sig  . famotidine (PEPCID) 20 MG tablet Take 1 tablet (20 mg total) by mouth 2 (two) times daily. (Patient taking differently: Take 20 mg by mouth as needed for heartburn or indigestion.)  . lipase/protease/amylase (CREON) 36000 UNITS  CPEP capsule Take 2 capsules (72,000 Units total) by mouth 3 (three) times daily with meals.  Marland Kitchen losartan (COZAAR) 25 MG tablet Take 25 mg by mouth daily.  Marland Kitchen oxyCODONE-acetaminophen (PERCOCET/ROXICET) 5-325 MG tablet Take 1-2 tablets by mouth every 6 (six) hours as needed for moderate pain or severe pain. (Patient taking differently: Take 1-2 tablets by mouth as needed for moderate pain or severe pain.)  . promethazine (PHENERGAN) 25 MG tablet Take 25 mg by mouth as needed for nausea or vomiting.    Allergies: Allergies as of 05/29/2020 - Review Complete 05/29/2020  Allergen Reaction Noted  . Diclofenac Hives 12/14/2013  . Norvasc [amlodipine besylate] Other (See Comments) 08/15/2016  . Omeprazole Other (See Comments) 08/15/2016  . Prednisone Other (See Comments) 12/14/2013   Past Medical History:  Diagnosis Date  . Chronic bronchitis ([place])   . Chronic lower back pain   . Congenital single kidney   . GERD (gastroesophageal reflux disease)   . Headache    "once/month" (05/13/2017)  . High cholesterol   . Hypertension   . Migraine    "a couple/year" (05/13/2017)  . Nephrolithiasis 05/13/2017  . Pancreatitis   . Pneumonia ~ 09/2015  . Recurrent acute pancreatitis    Family History:  Family History  Problem Relation Age of Onset  . Hypertension Other   . Hypertension Mother   . Hypertension Father   . Kidney disease Father   . Hypertension Sister   . Diabetes Sister   . Hypertension  Brother   . Pancreatic cancer Paternal Grandmother   . Colon cancer Cousin   . Stomach cancer Neg Hx   . Esophageal cancer Neg Hx    Social History:  Currently lives in [place] with his girlfriend Notes prior history of alcohol use but denies any alcohol use since 2016 Denies any tobacco use or illicit drug use   Review of Systems: A complete ROS was negative except as per HPI.   Physical Exam: Blood pressure (!) 157/95, pulse 69, temperature (!) 97.5 F (36.4 C), temperature source Oral,  resp. rate 18, height 6\' 1"  (1.854 m), weight 79.4 kg, SpO2 96 %. Physical Exam Constitutional:      Comments: Appears uncomfortable  HENT:     Head: Normocephalic and atraumatic.  Cardiovascular:     Rate and Rhythm: Normal rate and regular rhythm.     Heart sounds: No murmur heard.   Pulmonary:     Effort: Pulmonary effort is normal.     Breath sounds: Normal breath sounds.  Abdominal:     General: Abdomen is flat. Bowel sounds are normal.     Palpations: Abdomen is soft.     Tenderness: There is abdominal tenderness in the epigastric area and left upper quadrant. There is guarding.  Skin:    General: Skin is warm and dry.  Neurological:     General: No focal deficit present.     Mental Status: He is alert and oriented to person, place, and time.  Psychiatric:        Mood and Affect: Mood normal.        Behavior: Behavior normal.     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  Acute on chronic pancreatitis Patient presents with epigastric and bilateral upper abdominal pain with nausea and vomiting. History of chronic pancreatis with pancreatic divisum and recurrent acute pancreatitis. Recently established at [facility] for this and was supposed to have MRCP yesterday but unable to do due to pain. Alert an oriented on exam, afebrile, HD stable, no leukocytosis, lipase elevated, BUN of 7. Received 2L fluids, zofran, and morphine in the ED.  Likely this is recurrent of his acute pancreatitis, treat with pain control,  nausea and advance diet as tolerated.   -Dilauded 1 mg q 3 prin -Phenergan 25 mg q  6 prn -CXR  -MRCP -CLD advanced as tolerated -Strict I/Os -Monitor CMP  CKD stage I Congential single kidney Cr of 1.2 on admission, baseline of 1.1-1.3. Appears at baseline -Monitor Cr.  Hypertension -continue losartan  GERD -Pepcid 20 mg twice daily  Diet: CLD Fluids: None DVT: lovenox Code: Full   Dispo: Admit patient to Observation with expected length of stay less than 2 midnights.  Signed: Iona Beard, MD 05/29/2020, 11:41 AM  Pager: [phone] After 5pm on weekdays and 1pm on weekends: On Call pager: [phone]

## 2020-05-29 NOTE — ED Notes (Signed)
Pt vomited a moderate amount.

## 2020-05-29 NOTE — ED Triage Notes (Signed)
States he has a history of pancreatitis and is being followed at [facility]. C/o vomiting , states he was seen by his PCP last wekk and had blood drawn. Was scheduled for an MRI yest at [facility] however was feeling to bad to sit up. States he has Division Pancreatitis.

## 2020-05-30 ENCOUNTER — Inpatient Hospital Stay (HOSPITAL_COMMUNITY): Payer: Managed Care, Other (non HMO)

## 2020-05-30 DIAGNOSIS — Z833 Family history of diabetes mellitus: Secondary | ICD-10-CM | POA: Diagnosis not present

## 2020-05-30 DIAGNOSIS — Z888 Allergy status to other drugs, medicaments and biological substances status: Secondary | ICD-10-CM | POA: Diagnosis not present

## 2020-05-30 DIAGNOSIS — Q600 Renal agenesis, unilateral: Secondary | ICD-10-CM | POA: Diagnosis not present

## 2020-05-30 DIAGNOSIS — N181 Chronic kidney disease, stage 1: Secondary | ICD-10-CM | POA: Diagnosis present

## 2020-05-30 DIAGNOSIS — K859 Acute pancreatitis without necrosis or infection, unspecified: Secondary | ICD-10-CM | POA: Diagnosis present

## 2020-05-30 DIAGNOSIS — Z8719 Personal history of other diseases of the digestive system: Secondary | ICD-10-CM | POA: Diagnosis not present

## 2020-05-30 DIAGNOSIS — Z8249 Family history of ischemic heart disease and other diseases of the circulatory system: Secondary | ICD-10-CM | POA: Diagnosis not present

## 2020-05-30 DIAGNOSIS — Z87442 Personal history of urinary calculi: Secondary | ICD-10-CM | POA: Diagnosis not present

## 2020-05-30 DIAGNOSIS — K219 Gastro-esophageal reflux disease without esophagitis: Secondary | ICD-10-CM | POA: Diagnosis present

## 2020-05-30 DIAGNOSIS — Z79899 Other long term (current) drug therapy: Secondary | ICD-10-CM | POA: Diagnosis not present

## 2020-05-30 DIAGNOSIS — Z800 Family history of malignant neoplasm of digestive organs: Secondary | ICD-10-CM | POA: Diagnosis not present

## 2020-05-30 DIAGNOSIS — Z841 Family history of disorders of kidney and ureter: Secondary | ICD-10-CM | POA: Diagnosis not present

## 2020-05-30 DIAGNOSIS — Z20822 Contact with and (suspected) exposure to covid-19: Secondary | ICD-10-CM | POA: Diagnosis present

## 2020-05-30 DIAGNOSIS — I129 Hypertensive chronic kidney disease with stage 1 through stage 4 chronic kidney disease, or unspecified chronic kidney disease: Secondary | ICD-10-CM | POA: Diagnosis present

## 2020-05-30 DIAGNOSIS — K861 Other chronic pancreatitis: Secondary | ICD-10-CM | POA: Diagnosis present

## 2020-05-30 DIAGNOSIS — Q453 Other congenital malformations of pancreas and pancreatic duct: Secondary | ICD-10-CM | POA: Diagnosis not present

## 2020-05-30 DIAGNOSIS — E7800 Pure hypercholesterolemia, unspecified: Secondary | ICD-10-CM | POA: Diagnosis present

## 2020-05-30 DIAGNOSIS — Z9049 Acquired absence of other specified parts of digestive tract: Secondary | ICD-10-CM | POA: Diagnosis not present

## 2020-05-30 DIAGNOSIS — E785 Hyperlipidemia, unspecified: Secondary | ICD-10-CM | POA: Diagnosis present

## 2020-05-30 LAB — CBC
HCT: 42.6 % (ref 39.0–52.0)
Hemoglobin: 13.8 g/dL (ref 13.0–17.0)
MCH: 30.2 pg (ref 26.0–34.0)
MCHC: 32.4 g/dL (ref 30.0–36.0)
MCV: 93.2 fL (ref 80.0–100.0)
Platelets: 218 10*3/uL (ref 150–400)
RBC: 4.57 MIL/uL (ref 4.22–5.81)
RDW: 17.2 % — ABNORMAL HIGH (ref 11.5–15.5)
WBC: 8.7 10*3/uL (ref 4.0–10.5)
nRBC: 0 % (ref 0.0–0.2)

## 2020-05-30 LAB — COMPREHENSIVE METABOLIC PANEL
ALT: 24 U/L (ref 0–44)
AST: 31 U/L (ref 15–41)
Albumin: 3.7 g/dL (ref 3.5–5.0)
Alkaline Phosphatase: 58 U/L (ref 38–126)
Anion gap: 11 (ref 5–15)
BUN: <5 mg/dL — ABNORMAL LOW (ref 6–20)
CO2: 25 mmol/L (ref 22–32)
Calcium: 9.5 mg/dL (ref 8.9–10.3)
Chloride: 99 mmol/L (ref 98–111)
Creatinine, Ser: 1.12 mg/dL (ref 0.61–1.24)
GFR, Estimated: >60 mL/min (ref >60)
Glucose, Bld: 118 mg/dL — ABNORMAL HIGH (ref 70–99)
Potassium: 3.2 mmol/L — ABNORMAL LOW (ref 3.5–5.1)
Sodium: 135 mmol/L (ref 135–145)
Total Bilirubin: 1.2 mg/dL (ref 0.3–1.2)
Total Protein: 7.5 g/dL (ref 6.5–8.1)

## 2020-05-30 LAB — HIV ANTIBODY (ROUTINE TESTING W REFLEX): HIV Screen 4th Generation wRfx: NONREACTIVE

## 2020-05-30 MED ORDER — POTASSIUM CHLORIDE 10 MEQ/100ML IV SOLN
10.0000 meq | INTRAVENOUS | Status: AC
  Administered 2020-05-30 (×3): 10 meq via INTRAVENOUS
  Filled 2020-05-30 (×3): qty 100

## 2020-05-30 MED ORDER — GADOBUTROL 1 MMOL/ML IV SOLN
7.0000 mL | Freq: Once | INTRAVENOUS | Status: AC | PRN
  Administered 2020-05-30: 7 mL via INTRAVENOUS

## 2020-05-30 MED ORDER — POTASSIUM CHLORIDE 10 MEQ/100ML IV SOLN
10.0000 meq | Freq: Once | INTRAVENOUS | Status: AC
  Administered 2020-05-30: 10 meq via INTRAVENOUS
  Filled 2020-05-30: qty 100

## 2020-05-30 MED ORDER — LACTATED RINGERS IV SOLN: INTRAVENOUS | Status: DC

## 2020-05-30 NOTE — Progress Notes (Signed)
  Date: 05/30/2020  Patient name: Kyle Berry  Medical record number: [id number]  Date of birth: 1971-02-14   I have seen and evaluated [name] and discussed their care with the Residency Team.  In brief, patient is a 50 year old male with a past medical history of recurrent acute pancreatitis, pancreatic divisum with exocrine pancreatic insufficiency, congenital single kidney, nonobstructive kidney stones, stage I CKD, GERD, hypertension who presented to the ED with worsening abdominal pain over the last couple days.  Patient states that over the last couple of days he has noted progressively worsening abdominal pain associated with nausea and vomiting and decreased oral intake.  He has similar to his prior episodes of acute on chronic pancreatitis.  Patient does have endorse having Percocet at home which he took for the pain but this did not help.  He was admitted for further evaluation.  Today, patient states that he still has persistent abdominal pain and did have a couple episodes of nausea and vomiting but was able to tolerate some oral intake last night and this morning.  PMHx, Fam Hx, and/or Soc Hx : As per resident admit note  Vitals:   05/30/20 0815 05/30/20 1212  BP: 125/85 113/79  Pulse: 82 93  Resp: 16 16  Temp: 98.7 F (37.1 C) 98.6 F (37 C)  SpO2: 96% 97%   General: Awake, alert, oriented x3, mild distress secondary to abdominal pain CVS: Regular rhythm, normal heart sounds Lungs: CTA bilaterally Abdomen: Soft, mild tenderness noted in epigastric and left upper quadrant with voluntary guarding, no rigidity, normoactive bowel sounds Extremities: No edema noted, not tender to palpation Psych: Normal mood and affect HEENT: Normocephalic, atraumatic Skin: Warm and dry  Assessment and Plan: I have seen and evaluated the patient as outlined above. I agree with the formulated Assessment and Plan as detailed in the residents' note, with the following changes:    1.  Acute on chronic pancreatitis: -Patient presented to the ED with progressive abdominal pain over the last couple of days associated with nausea and vomiting similar to his prior episodes of pancreatitis and was noted to have an elevated lipase of 526 which would correlate with this. -He was initially started on clear liquid diet and states that he is able to tolerate a little of this -We will continue with pain control and IVF for now -Patient was scheduled to have an MRCP as an outpatient.  This is obtained as an inpatient today.  MRCP showed pancreatic divisum without ductal dilatation as well as mild peripancreatic edema consistent with pancreatitis. -We will attempt to advance diet slowly as tolerated. -No further work-up at this time  Aldine Contes, MD 2/14/20224:38 PM

## 2020-05-30 NOTE — Progress Notes (Addendum)
HD#0 Subjective:  Overnight Events: none  Mr Robicheaux evaluated at bedside. He endorses ongoing abdominal pain with some intermittent nausea/vomiting. Has had 2 episodes of emesis overnight. Unable to tolerate PO at this time.  Notes no significant improvement compared to yesterday. NPO for MRCP  Objective:  Vital signs in last 24 hours: Vitals:   05/30/20 0102 05/30/20 0436 05/30/20 0815 05/30/20 1212  BP: (!) 161/97 (!) 146/96 125/85 113/79  Pulse: 68 71 82 93  Resp: 18 18 16 16   Temp: 98.1 F (36.7 C) 98.4 F (36.9 C) 98.7 F (37.1 C) 98.6 F (37 C)  TempSrc: Oral Oral Oral Oral  SpO2: 98% 97% 96% 97%  Weight:  72.7 kg    Height:       Supplemental O2: Room Air SpO2: 97 %   Physical Exam:  Constitutional:Appears well-developed and well-nourished. Laying in bed no distress.  Cardiovascular: Normal rate, regular rhythm, S1 and S2 present, no murmurs, rubs, gallops. Distal pulses intact Respiratory: No respiratory distress, no accessory muscle use. Lungs are clear to auscultation bilaterally. GI:TTP of the epigastrium and LUQ, soft, activebowel sounds Musculoskeletal: Normal bulk and tone. No peripheral edema noted. Neurological: Is alert and oriented x4, no apparent focal deficits noted. Skin: Warm and dry.  Filed Weights   05/29/20 0644 05/30/20 0436  Weight: 79.4 kg 72.7 kg    No intake or output data in the 24 hours ending 05/30/20 1325 Net IO Since Admission: 2,000 mL [05/30/20 1325]  Pertinent Labs: CBC Latest Ref Rng & Units 05/30/2020 05/29/2020 04/15/2020  WBC 4.0 - 10.5 K/uL 8.7 10.5 4.0  Hemoglobin 13.0 - 17.0 g/dL 13.8 14.2 11.4(L)  Hematocrit 39.0 - 52.0 % 42.6 44.0 36.3(L)  Platelets 150 - 400 K/uL 218 235 228    CMP Latest Ref Rng & Units 05/30/2020 05/29/2020 04/15/2020  Glucose 70 - 99 mg/dL 118(H) 151(H) 104(H)  BUN 6 - 20 mg/dL <5(L) 7 5(L)  Creatinine 0.61 - 1.24 mg/dL 1.12 1.21 1.13  Sodium 135 - 145 mmol/L 135 136 136  Potassium  3.5 - 5.1 mmol/L 3.2(L) 3.4(L) 4.0  Chloride 98 - 111 mmol/L 99 101 103  CO2 22 - 32 mmol/L 25 20(L) 20(L)  Calcium 8.9 - 10.3 mg/dL 9.5 9.5 8.6(L)  Total Protein 6.5 - 8.1 g/dL 7.5 8.0 -  Total Bilirubin 0.3 - 1.2 mg/dL 1.2 2.0(H) -  Alkaline Phos 38 - 126 U/L 58 65 -  AST 15 - 41 U/L 31 48(H) -  ALT 0 - 44 U/L 24 32 -    Imaging: DG CHEST PORT 1 VIEW  Result Date: 05/29/2020 CLINICAL DATA:  Vomiting.  History of pancreatitis. EXAM: PORTABLE CHEST 1 VIEW COMPARISON:  Multiple exams, including CT abdomen 04/12/2020 and chest radiograph 10/13/2019 FINDINGS: Low lung volumes are present, causing crowding of the pulmonary vasculature. Linear subsegmental atelectasis or scarring along the left hemidiaphragm. The lungs appear otherwise clear. Cardiac and mediastinal margins appear normal. IMPRESSION: 1. Low lung volumes with minimal subsegmental atelectasis or scarring along the left hemidiaphragm. Electronically Signed   By: Van Clines M.D.   On: 05/29/2020 12:23   Assessment/Plan:   Active Problems:   Acute pancreatitis   Pancreatitis   Patient Summary: Kyle Berry is a 50 year old male with PMHx of recurrent acute pancreatitis, pancreatic divisum with exocrine pancreatic insufficiency on pancreatic enzyme replacement therapy, congential single kidney, nonobstructive kidney stones, stage I CKD, GERD, hypertension, hyperlipidemia  presenting with abdominal pain with nausea and vomiting  admitted for acute pancreatitis.  Acute on chronic pancreatitis Continue to have abdominal pain and nausea. Was unable to tolerate PO intake last night and this morning. NPO for MRCP this morning. Will try food later this afternoon. Will continue with symptomatic management and advance diet as tolerated. Continue on maintaince fluids until he is able to tolerate PO intake. -Dilauded 1 mg q 3 prin -Phenergan 25 mg q 6 prn -MRCP today, follow up results -CLD,  advance as tolerated -Strict  I/Os -Monitor CMP  CKD stage I Congential single kidney Cr of 1.2 on admission, baseline of 1.1-1.3. Remains at baseline at 1.1 today -Continue to monitor  Hypertension -Continue losartan  GERD -Pepcid 20 mg twice daily  Diet: CLD Fluids: LF 13mL DVT: lovenox Code: Full  Dispo: Anticipated discharge to Home in 2 days pending further management.   Iona Beard, MD 05/30/2020, 1:25 PM Pager: [phone]  Please contact the on call pager after 5 pm and on weekends at [phone].

## 2020-05-31 LAB — BASIC METABOLIC PANEL
Anion gap: 9 (ref 5–15)
BUN: 6 mg/dL (ref 6–20)
CO2: 26 mmol/L (ref 22–32)
Calcium: 8.8 mg/dL — ABNORMAL LOW (ref 8.9–10.3)
Chloride: 102 mmol/L (ref 98–111)
Creatinine, Ser: 1.18 mg/dL (ref 0.61–1.24)
GFR, Estimated: >60 mL/min (ref >60)
Glucose, Bld: 102 mg/dL — ABNORMAL HIGH (ref 70–99)
Potassium: 3.5 mmol/L (ref 3.5–5.1)
Sodium: 137 mmol/L (ref 135–145)

## 2020-05-31 LAB — CBC
HCT: 35.5 % — ABNORMAL LOW (ref 39.0–52.0)
Hemoglobin: 11.6 g/dL — ABNORMAL LOW (ref 13.0–17.0)
MCH: 30.4 pg (ref 26.0–34.0)
MCHC: 32.7 g/dL (ref 30.0–36.0)
MCV: 92.9 fL (ref 80.0–100.0)
Platelets: 186 10*3/uL (ref 150–400)
RBC: 3.82 MIL/uL — ABNORMAL LOW (ref 4.22–5.81)
RDW: 17.1 % — ABNORMAL HIGH (ref 11.5–15.5)
WBC: 5.4 10*3/uL (ref 4.0–10.5)
nRBC: 0 % (ref 0.0–0.2)

## 2020-05-31 NOTE — Progress Notes (Signed)
HD#1 Subjective:  Overnight Events: none  Patient is seen at bedside. He reports tolerating breakfast ok. States that pain in 7/10. No vomiting today.   Objective:  Vital signs in last 24 hours: Vitals:   05/30/20 1212 05/30/20 1802 05/31/20 0031 05/31/20 0445  BP: 113/79 138/88 (!) 135/97 (!) 140/97  Pulse: 93 85 68 69  Resp: 16 16 16 16   Temp: 98.6 F (37 C) 99.3 F (37.4 C) 98.1 F (36.7 C) 98.5 F (36.9 C)  TempSrc: Oral Oral Oral Oral  SpO2: 97% 98% 98% 97%  Weight:    73.9 kg  Height:       Supplemental O2: Room Air SpO2: 97 %   Physical Exam:  Constitutional: Appears well-developed and well-nourished. Laying in bed no distress.  Cardiovascular: Normal rate, regular rhythm, S1 and S2 present, no murmurs, rubs, gallops. Distal pulses intact Respiratory: No respiratory distress, no accessory muscle use. Lungs are clear to auscultation bilaterally. GI:TTP of the epigastrium, RUQ,  and LUQ, soft, activebowel sounds, no massess Musculoskeletal: Normal bulk and tone. No peripheral edema noted. Neurological: Is alert and oriented x4, no apparent focal deficits noted. Skin: Warm and dry.  Filed Weights   05/29/20 0644 05/30/20 0436 05/31/20 0445  Weight: 79.4 kg 72.7 kg 73.9 kg    No intake or output data in the 24 hours ending 05/31/20 0551 Net IO Since Admission: 2,000 mL [05/31/20 0551]  Pertinent Labs: CBC Latest Ref Rng & Units 05/31/2020 05/30/2020 05/29/2020  WBC 4.0 - 10.5 K/uL 5.4 8.7 10.5  Hemoglobin 13.0 - 17.0 g/dL 11.6(L) 13.8 14.2  Hematocrit 39.0 - 52.0 % 35.5(L) 42.6 44.0  Platelets 150 - 400 K/uL 186 218 235    CMP Latest Ref Rng & Units 05/31/2020 05/30/2020 05/29/2020  Glucose 70 - 99 mg/dL 102(H) 118(H) 151(H)  BUN 6 - 20 mg/dL 6 <5(L) 7  Creatinine 0.61 - 1.24 mg/dL 1.18 1.12 1.21  Sodium 135 - 145 mmol/L 137 135 136  Potassium 3.5 - 5.1 mmol/L 3.5 3.2(L) 3.4(L)  Chloride 98 - 111 mmol/L 102 99 101  CO2 22 - 32 mmol/L 26 25 20(L)   Calcium 8.9 - 10.3 mg/dL 8.8(L) 9.5 9.5  Total Protein 6.5 - 8.1 g/dL - 7.5 8.0  Total Bilirubin 0.3 - 1.2 mg/dL - 1.2 2.0(H)  Alkaline Phos 38 - 126 U/L - 58 65  AST 15 - 41 U/L - 31 48(H)  ALT 0 - 44 U/L - 24 32    Imaging: DG CHEST PORT 1 VIEW  Result Date: 05/29/2020 CLINICAL DATA:  Vomiting.  History of pancreatitis. EXAM: PORTABLE CHEST 1 VIEW COMPARISON:  Multiple exams, including CT abdomen 04/12/2020 and chest radiograph 10/13/2019 FINDINGS: Low lung volumes are present, causing crowding of the pulmonary vasculature. Linear subsegmental atelectasis or scarring along the left hemidiaphragm. The lungs appear otherwise clear. Cardiac and mediastinal margins appear normal. IMPRESSION: 1. Low lung volumes with minimal subsegmental atelectasis or scarring along the left hemidiaphragm. Electronically Signed   By: Van Clines M.D.   On: 05/29/2020 12:23   MRCP IMPRESSION: 1. Mild motion degraded exam. 2. Peripancreatic edema, mild. Similar 2 slightly decreased compared to the prior MRI of 01/30/2020. Given elevated lipase, likely due to acute on chronic pancreatitis. 3. Pancreas divisum, without duct dilatation. 4. Cholecystectomy without choledocholithiasis. 5. Trace abdominal ascites. 6. Marked right renal atrophy. 7.  Aortic Atherosclerosis (ICD10-I70.0).  Assessment/Plan:   Active Problems:   Acute pancreatitis   Pancreatitis  Patient Summary: Kyle Berry is a 50 year old male with PMHx of recurrent acute pancreatitis, pancreatic divisum with exocrine pancreatic insufficiency on pancreatic enzyme replacement therapy, congential single kidney, nonobstructive kidney stones, stage I CKD, GERD, hypertension, hyperlipidemia  presenting with abdominal pain with nausea and vomiting admitted for acute pancreatitis.  Acute on chronic pancreatitis Tolerated a small amount of breakfast and ate a small amount of lunch prior to exam today.Still have abdominal pain but  controlled on current medication. MRCP with peripancreatic edema consistent with acute on chronic pancreatitis. Pancreatic divisum without duct dilatation.  Continue to monitor as she advances diet. Appears to be progressing well with diet likely will be ready for discharge home soon. -Dilauded 1 mg q 3 prn -Phenergan 25 mg q 6 prn -Continue on regular diet -Strict I/Os -Monitor CMP  CKD stage I Congential single kidney Cr of 1.2 on admission, baseline of 1.1-1.3. Stable at 1.18 today  Hypertension -Continue losartan  GERD -Pepcid 20 mg twice daily  Diet: Regular Fluids: LF 126mL DVT: lovenox Code: Full  Dispo: Anticipated discharge to Home tomorrow.  Iona Beard, MD 05/31/2020, 5:51 AM Pager: [phone]  Please contact the on call pager after 5 pm and on weekends at [phone].

## 2020-06-01 LAB — CBC
HCT: 35.5 % — ABNORMAL LOW (ref 39.0–52.0)
Hemoglobin: 11.4 g/dL — ABNORMAL LOW (ref 13.0–17.0)
MCH: 30.6 pg (ref 26.0–34.0)
MCHC: 32.1 g/dL (ref 30.0–36.0)
MCV: 95.4 fL (ref 80.0–100.0)
Platelets: 192 10*3/uL (ref 150–400)
RBC: 3.72 MIL/uL — ABNORMAL LOW (ref 4.22–5.81)
RDW: 17.2 % — ABNORMAL HIGH (ref 11.5–15.5)
WBC: 6 10*3/uL (ref 4.0–10.5)
nRBC: 0 % (ref 0.0–0.2)

## 2020-06-01 LAB — BASIC METABOLIC PANEL
Anion gap: 11 (ref 5–15)
BUN: 7 mg/dL (ref 6–20)
CO2: 25 mmol/L (ref 22–32)
Calcium: 9.1 mg/dL (ref 8.9–10.3)
Chloride: 101 mmol/L (ref 98–111)
Creatinine, Ser: 1.15 mg/dL (ref 0.61–1.24)
GFR, Estimated: >60 mL/min (ref >60)
Glucose, Bld: 121 mg/dL — ABNORMAL HIGH (ref 70–99)
Potassium: 3.7 mmol/L (ref 3.5–5.1)
Sodium: 137 mmol/L (ref 135–145)

## 2020-06-01 MED ORDER — SENNA 8.6 MG PO TABS: 1.0000 | ORAL_TABLET | Freq: Every day | ORAL | Status: DC | PRN

## 2020-06-01 MED ORDER — HYDROMORPHONE HCL 1 MG/ML IJ SOLN
1.0000 mg | INTRAMUSCULAR | Status: DC | PRN
  Administered 2020-06-01 – 2020-06-02 (×2): 1 mg via INTRAVENOUS
  Filled 2020-06-01 (×2): qty 1

## 2020-06-01 MED ORDER — HYDROMORPHONE HCL 2 MG PO TABS
2.0000 mg | ORAL_TABLET | Freq: Once | ORAL | Status: AC
  Administered 2020-06-01: 2 mg via ORAL
  Filled 2020-06-01: qty 1

## 2020-06-01 MED ORDER — HYDROMORPHONE HCL 2 MG PO TABS
4.0000 mg | ORAL_TABLET | ORAL | Status: DC | PRN
  Administered 2020-06-01: 4 mg via ORAL
  Filled 2020-06-01: qty 2

## 2020-06-01 MED ORDER — HYDROMORPHONE HCL 1 MG/ML PO LIQD: 4.0000 mg | ORAL | Status: DC | PRN

## 2020-06-01 NOTE — Progress Notes (Addendum)
HD#2 Subjective:  Overnight Events: none  Patient is seen at bedside. He reports issue with dinner last night, can't tolerate much food. No problem with drinking fluid, but can't drink a lot. No BM in 2 days. Reports abdominal pain with eating.    Objective:  Vital signs in last 24 hours: Vitals:   05/31/20 1155 05/31/20 1712 05/31/20 2306 06/01/20 0515  BP: (!) 137/95 (!) 138/96 (!) 146/97 (!) 168/94  Pulse: 68 69 79 77  Resp: 16 16 15 16   Temp: 98.3 F (36.8 C) 99.3 F (37.4 C) 98.2 F (36.8 C) 98.5 F (36.9 C)  TempSrc: Oral Oral Oral Oral  SpO2: 97% 96% 96% 95%  Weight:    74.4 kg  Height:       Supplemental O2: Room Air SpO2: 95 %   Physical Exam:  Constitutional: Appears well-developed and well-nourished. Laying in bed no distress.  Cardiovascular: Normal rate, regular rhythm, S1 and S2 present, no murmurs, rubs, gallops.  Distal pulses intact Respiratory: No respiratory distress, no accessory muscle use. Lungs are clear to auscultation bilaterally. GI: TTP of the epigastrium, RUQ,  and LUQ, soft, active bowel sounds, no massess Musculoskeletal: Normal bulk and tone.  No peripheral edema noted. Neurological: Is alert and oriented x4, no apparent focal deficits noted. Skin: Warm and dry.    Filed Weights   05/30/20 0436 05/31/20 0445 06/01/20 0515  Weight: 72.7 kg 73.9 kg 74.4 kg     Intake/Output Summary (Last 24 hours) at 06/01/2020 0720 Last data filed at 05/31/2020 1515 Gross per 24 hour  Intake 2000 ml  Output --  Net 2000 ml   Net IO Since Admission: 4,000 mL [06/01/20 0720]  Pertinent Labs: CBC Latest Ref Rng & Units 06/01/2020 05/31/2020 05/30/2020  WBC 4.0 - 10.5 K/uL 6.0 5.4 8.7  Hemoglobin 13.0 - 17.0 g/dL 11.4(L) 11.6(L) 13.8  Hematocrit 39.0 - 52.0 % 35.5(L) 35.5(L) 42.6  Platelets 150 - 400 K/uL 192 186 218    CMP Latest Ref Rng & Units 06/01/2020 05/31/2020 05/30/2020  Glucose 70 - 99 mg/dL 121(H) 102(H) 118(H)  BUN 6 - 20 mg/dL 7 6  <5(L)  Creatinine 0.61 - 1.24 mg/dL 1.15 1.18 1.12  Sodium 135 - 145 mmol/L 137 137 135  Potassium 3.5 - 5.1 mmol/L 3.7 3.5 3.2(L)  Chloride 98 - 111 mmol/L 101 102 99  CO2 22 - 32 mmol/L 25 26 25   Calcium 8.9 - 10.3 mg/dL 9.1 8.8(L) 9.5  Total Protein 6.5 - 8.1 g/dL - - 7.5  Total Bilirubin 0.3 - 1.2 mg/dL - - 1.2  Alkaline Phos 38 - 126 U/L - - 58  AST 15 - 41 U/L - - 31  ALT 0 - 44 U/L - - 24    Imaging: DG CHEST PORT 1 VIEW  Result Date: 05/29/2020 CLINICAL DATA:  Vomiting.  History of pancreatitis. EXAM: PORTABLE CHEST 1 VIEW COMPARISON:  Multiple exams, including CT abdomen 04/12/2020 and chest radiograph 10/13/2019 FINDINGS: Low lung volumes are present, causing crowding of the pulmonary vasculature. Linear subsegmental atelectasis or scarring along the left hemidiaphragm. The lungs appear otherwise clear. Cardiac and mediastinal margins appear normal. IMPRESSION: 1. Low lung volumes with minimal subsegmental atelectasis or scarring along the left hemidiaphragm. Electronically Signed   By: Van Clines M.D.   On: 05/29/2020 12:23   MRCP IMPRESSION: 1. Mild motion degraded exam. 2. Peripancreatic edema, mild. Similar 2 slightly decreased compared to the prior MRI of 01/30/2020. Given elevated lipase, likely  due to acute on chronic pancreatitis. 3. Pancreas divisum, without duct dilatation. 4. Cholecystectomy without choledocholithiasis. 5. Trace abdominal ascites. 6. Marked right renal atrophy. 7.  Aortic Atherosclerosis (ICD10-I70.0).  Assessment/Plan:   Active Problems:   Acute pancreatitis   Pancreatitis  Patient Summary: Kyle Berry is a 50 year old male with PMHx of recurrent acute pancreatitis, pancreatic divisum with exocrine pancreatic insufficiency on pancreatic enzyme replacement therapy, congential single kidney, nonobstructive kidney stones, stage I CKD, GERD, hypertension, hyperlipidemia  presenting with abdominal pain with nausea and vomiting  admitted for acute pancreatitis.   Acute on chronic pancreatitis Still have significant pain with eating as well as intermittently. Tolerating liquid well as long as he drinks slowly. Has not has BM in 2 days. Epigastric pain and LUQ pain on exam, otherwise soft with normal bowel sounds. Attempted transitioning to oral Dilaudid but felt that it worsened his nausea. Will continue with IV medication for now and assess in the morning. -Dilauded 1 mg q 3 prn -Phenergan 25 mg q 6 prn -Continue on regular diet - Senna -Strict I/Os -Monitor CMP   CKD stage I Congential single kidney Stable remains at baseline   Hypertension -Continue losartan   GERD -Pepcid 20 mg twice daily   Diet: Regular Fluids: None DVT: lovenox Code: Full  Dispo: Anticipated discharge to Home tomorrow.  Iona Beard, MD 06/01/2020, 7:20 AM Pager: [phone]  Please contact the on call pager after 5 pm and on weekends at [phone].

## 2020-06-02 LAB — CBC
HCT: 36.4 % — ABNORMAL LOW (ref 39.0–52.0)
Hemoglobin: 11.6 g/dL — ABNORMAL LOW (ref 13.0–17.0)
MCH: 30.3 pg (ref 26.0–34.0)
MCHC: 31.9 g/dL (ref 30.0–36.0)
MCV: 95 fL (ref 80.0–100.0)
Platelets: 221 10*3/uL (ref 150–400)
RBC: 3.83 MIL/uL — ABNORMAL LOW (ref 4.22–5.81)
RDW: 17.5 % — ABNORMAL HIGH (ref 11.5–15.5)
WBC: 6.6 10*3/uL (ref 4.0–10.5)
nRBC: 0 % (ref 0.0–0.2)

## 2020-06-02 LAB — BASIC METABOLIC PANEL
Anion gap: 11 (ref 5–15)
BUN: 8 mg/dL (ref 6–20)
CO2: 28 mmol/L (ref 22–32)
Calcium: 9.5 mg/dL (ref 8.9–10.3)
Chloride: 98 mmol/L (ref 98–111)
Creatinine, Ser: 1.36 mg/dL — ABNORMAL HIGH (ref 0.61–1.24)
GFR, Estimated: >60 mL/min (ref >60)
Glucose, Bld: 120 mg/dL — ABNORMAL HIGH (ref 70–99)
Potassium: 3.7 mmol/L (ref 3.5–5.1)
Sodium: 137 mmol/L (ref 135–145)

## 2020-06-02 MED ORDER — MORPHINE SULFATE 15 MG PO TABS
15.0000 mg | ORAL_TABLET | Freq: Four times a day (QID) | ORAL | Status: DC | PRN
  Administered 2020-06-02 (×2): 15 mg via ORAL
  Filled 2020-06-02 (×2): qty 1

## 2020-06-02 MED ORDER — ACETAMINOPHEN 325 MG PO TABS: 650.0000 mg | ORAL_TABLET | Freq: Four times a day (QID) | ORAL | Status: DC | PRN

## 2020-06-02 MED ORDER — HYDROMORPHONE HCL 1 MG/ML IJ SOLN
1.0000 mg | INTRAMUSCULAR | Status: DC | PRN
  Administered 2020-06-02 – 2020-06-03 (×4): 1 mg via INTRAVENOUS
  Filled 2020-06-02 (×4): qty 1

## 2020-06-02 NOTE — Progress Notes (Signed)
HD#3 Subjective:  Overnight Events: none  Patient evaluated at bedside. Continues to have dull abdominal pain after eating. Feels similar to yesterday. Denies any vomiting. Agreeable to trying to oral morphine to see if that helps with pain.  Objective:  Vital signs in last 24 hours: Vitals:   06/01/20 1201 06/01/20 1806 06/01/20 2338 06/02/20 0531  BP: (!) 156/105 (!) 151/100 (!) 146/96 (!) 149/88  Pulse: 68 88 82 71  Resp: 16 16 18 18   Temp: 98.6 F (37 C) (!) 97.5 F (36.4 C) 98.6 F (37 C) 98.3 F (36.8 C)  TempSrc: Oral Oral Oral Oral  SpO2: 97% 96% 94% 96%  Weight:    73.8 kg  Height:       Supplemental O2: Room Air SpO2: 96 %   Physical Exam:  Constitutional: Appears well-developed and well-nourished. Laying in bed no distress.  Cardiovascular: Normal rate, regular rhythm, S1 and S2 present, no murmurs, rubs, gallops.  Distal pulses intact Respiratory: No respiratory distress, no accessory muscle use. Lungs are clear to auscultation bilaterally. GI: TTP of the epigastrium, RUQ,  and LUQ, soft, active bowel sounds, no distension Musculoskeletal: Normal bulk and tone.  No peripheral edema noted. Neurological: Is alert and oriented x4, no apparent focal deficits noted. Skin: Warm and dry.    Filed Weights   05/31/20 0445 06/01/20 0515 06/02/20 0531  Weight: 73.9 kg 74.4 kg 73.8 kg     Intake/Output Summary (Last 24 hours) at 06/02/2020 0550 Last data filed at 06/01/2020 2133 Gross per 24 hour  Intake 3 ml  Output --  Net 3 ml   Net IO Since Admission: 4,003 mL [06/02/20 0550]  Pertinent Labs: CBC Latest Ref Rng & Units 06/02/2020 06/01/2020 05/31/2020  WBC 4.0 - 10.5 K/uL 6.6 6.0 5.4  Hemoglobin 13.0 - 17.0 g/dL 11.6(L) 11.4(L) 11.6(L)  Hematocrit 39.0 - 52.0 % 36.4(L) 35.5(L) 35.5(L)  Platelets 150 - 400 K/uL 221 192 186    CMP Latest Ref Rng & Units 06/02/2020 06/01/2020 05/31/2020  Glucose 70 - 99 mg/dL 120(H) 121(H) 102(H)  BUN 6 - 20 mg/dL 8 7 6    Creatinine 0.61 - 1.24 mg/dL 1.36(H) 1.15 1.18  Sodium 135 - 145 mmol/L 137 137 137  Potassium 3.5 - 5.1 mmol/L 3.7 3.7 3.5  Chloride 98 - 111 mmol/L 98 101 102  CO2 22 - 32 mmol/L 28 25 26   Calcium 8.9 - 10.3 mg/dL 9.5 9.1 8.8(L)  Total Protein 6.5 - 8.1 g/dL - - -  Total Bilirubin 0.3 - 1.2 mg/dL - - -  Alkaline Phos 38 - 126 U/L - - -  AST 15 - 41 U/L - - -  ALT 0 - 44 U/L - - -    Imaging: DG CHEST PORT 1 VIEW  Result Date: 05/29/2020 CLINICAL DATA:  Vomiting.  History of pancreatitis. EXAM: PORTABLE CHEST 1 VIEW COMPARISON:  Multiple exams, including CT abdomen 04/12/2020 and chest radiograph 10/13/2019 FINDINGS: Low lung volumes are present, causing crowding of the pulmonary vasculature. Linear subsegmental atelectasis or scarring along the left hemidiaphragm. The lungs appear otherwise clear. Cardiac and mediastinal margins appear normal. IMPRESSION: 1. Low lung volumes with minimal subsegmental atelectasis or scarring along the left hemidiaphragm. Electronically Signed   By: Van Clines M.D.   On: 05/29/2020 12:23   MRCP IMPRESSION: 1. Mild motion degraded exam. 2. Peripancreatic edema, mild. Similar 2 slightly decreased compared to the prior MRI of 01/30/2020. Given elevated lipase, likely due to acute on chronic  pancreatitis. 3. Pancreas divisum, without duct dilatation. 4. Cholecystectomy without choledocholithiasis. 5. Trace abdominal ascites. 6. Marked right renal atrophy. 7.  Aortic Atherosclerosis (ICD10-I70.0).  Assessment/Plan:   Principal Problem:   Acute on chronic pancreatitis (HCC) Active Problems:   Chronic pancreatitis ([place])   Pancreatic divisum   Acute pancreatitis   Pancreatitis  Patient Summary: Kyle Berry is a 50 year old male with PMHx of recurrent acute pancreatitis, pancreatic divisum with exocrine pancreatic insufficiency on pancreatic enzyme replacement therapy, congential single kidney, nonobstructive kidney stones, stage I CKD,  GERD, hypertension, hyperlipidemia  presenting with abdominal pain with nausea and vomiting admitted for acute pancreatitis.   Acute on chronic pancreatitis Pain similar to yesterday. Mild epigastric pain and LUQ pain on exam, otherwise soft with normal bowel sounds. He would like to try oral morphine today to see if that helps with pain. -morphine 15 mg q3 PRN -Phenergan 25 mg q 6 prn -Continue on regular diet - Senna -Strict I/Os -Monitor CMP   CKD stage I Congential single kidney Cr. Of 1.3 today. Appears well hydrated, is tolerating small amounts of fluid. - continue to monitor renal function   Hypertension -Continue losartan   GERD -Pepcid 20 mg twice daily   Diet: Regular Fluids: None DVT: lovenox Code: Full  Dispo: Anticipated discharge to Home in 0-1 days pending improvement in pain.  Iona Beard, MD 06/02/2020, 5:50 AM Pager: [phone]  Please contact the on call pager after 5 pm and on weekends at [phone].

## 2020-06-03 NOTE — Discharge Summary (Addendum)
Name: Kyle Berry MRN: [id number] DOB: 02/23/1971 50 y.o. PCP: [name] (Inactive)  Date of Admission: 05/29/2020  6:27 AM Date of Discharge: 06/03/2020 Attending Physician: Lenice Pressman, MD, PhD   Discharge Diagnosis: 1. Acute on chronic pancreatitis  Discharge Medications: Allergies as of 06/03/2020       Reactions   Diclofenac Hives   Norvasc [amlodipine Besylate] Other (See Comments)   Fluid buildup in chest   Omeprazole Other (See Comments)   MD stopped due to pancreatitis   Prednisone Other (See Comments)   Mood swings        Medication List     STOP taking these medications    oxyCODONE-acetaminophen 5-325 MG tablet Commonly known as: PERCOCET/ROXICET       TAKE these medications    famotidine 20 MG tablet Commonly known as: PEPCID Take 1 tablet (20 mg total) by mouth 2 (two) times daily. What changed:  when to take this reasons to take this   lipase/protease/amylase 36000 UNITS Cpep capsule Commonly known as: CREON Take 2 capsules (72,000 Units total) by mouth 3 (three) times daily with meals.   losartan 25 MG tablet Commonly known as: COZAAR Take 25 mg by mouth daily.   promethazine 25 MG tablet Commonly known as: PHENERGAN Take 25 mg by mouth as needed for nausea or vomiting.        Disposition and follow-up:   KyleMaliek L Berry was discharged from [facility] in Stable condition.  At the hospital follow up visit please address:  1.  Acute on chronic pancreatitis: Will need continued follow up with [facility] for management of pancreatitis. Has not been taking Creon please adjust medications as needed   2.  Labs / imaging needed at time of follow-up:CMP  3.  Pending labs/ test needing follow-up: None  Follow-up Appointments:    Hospital Course by problem list: Kyle Berry is a 50 year old male with PMHx of recurrent acute pancreatitis, pancreatic divisum with exocrine pancreatic  insufficiency on pancreatic enzyme replacement therapy, congential single kidney, nonobstructive kidney stones, stage I CKD, GERD, hypertension, hyperlipidemia  presenting with abdominal pain with nausea and vomiting admitted for acute pancreatitis.   Acute on chronic pancreatitis Patient presents with epigastric and bilateral upper abdominal pain with nausea and vomiting. History of chronic pancreatis with pancreatic divisum and recurrent acute pancreatitis. Recently established at [facility] for this and was supposed to have MRCP the day prior to admission but unable to do due to pain. He has not been taking his creon as it causes worsened abdominal pain, and up coming gastric emptying . On admission lipase elevated, BUN of 7. Received 2L fluids, zofran, and morphine in the ED. Treated with IV dilaudid, and phenergan. MRCP performed during admission showing peripancreatic edema, mild c/w with acute on chronic pancreatitis. Pancreas divisum, without duct dilatation. Pain improved on day of discharge. Tolerating diet well had a BM. Will need to follow up with [facility] for pancreatitis, and possible gastroparesis.    Subjective: Patient this morning feeling better. States abdominal pain is much more tolerable. Able to eat breakfast without much discomfort. Drinking fluids well. Had a BM this morning. Feels ready to go home.   Discharge Exam:   BP 136/88 (BP Location: Right Arm)   Pulse 73   Temp 98.2 F (36.8 C) (Oral)   Resp 16   Ht 6\' 1"  (1.854 m)   Wt 74.4 kg   SpO2 96%   BMI 21.64 kg/m  Discharge exam:  Constitutional: Appears well-developed and well-nourished. Laying in bed no distress.  Cardiovascular: Normal rate, regular rhythm, S1 and S2 present, no murmurs, rubs, gallops.  Distal pulses intact Respiratory: No respiratory distress, no accessory muscle use. Lungs are clear to auscultation bilaterally. GI: Non tenderness to palpation, soft, active bowel sounds, no distension Musculoskeletal:  Normal bulk and tone.  No peripheral edema noted. Neurological: Is alert and oriented x4, no apparent focal deficits noted. Skin: Warm and dry.    Pertinent Labs, Studies, and Procedures:  DG CHEST PORT 1 VIEW  Result Date: 05/29/2020  IMPRESSION: 1. Low lung volumes with minimal subsegmental atelectasis or scarring along the left hemidiaphragm. Electronically Signed   By: Van Clines M.D.   On: 05/29/2020 12:23   MR ABDOMEN MRCP W WO CONTAST  Result Date: 05/30/2020  FINDINGS: Mild motion degradation, primarily affecting the dedicated MRCP series. Lower chest: Mild cardiomegaly, without pericardial or pleural effusion. Hepatobiliary: Tiny hepatic cysts or bile duct hamartomas. No suspicious liver lesion. Cholecystectomy. Normal common duct caliber after cholecystectomy, including at 8 mm on 21/3. No choledocholithiasis. Pancreas: Portions of the pancreatic body and neck are atrophic. No pancreatic duct dilatation. Pancreas divisum again identified, with a prominent dorsal duct entering the duodenum on 30/4. Mild peripancreatic edema with thickening of the anterior pararenal fascia. Similar to slightly decreased compared to 01/30/2020. No evidence of pancreatic necrosis or well-circumscribed peripancreatic fluid collection. Spleen:  Normal in size, without focal abnormality. Adrenals/Urinary Tract: Normal adrenal glands. Atrophic right kidney. Compensatory hypertrophy of the left kidney. No hydronephrosis. Stomach/Bowel: The proximal stomach is underdistended. Apparent greater curvature wall thickening is likely secondary at 1.8 cm on 15/4. Fluid-filled colon, suggesting a diarrheal state. Normal small bowel caliber. Vascular/Lymphatic: Normal aortic caliber. Patent portal and splenic veins. No abdominal adenopathy. Other:  Trace perihepatic ascites. Musculoskeletal: No acute osseous abnormality. IMPRESSION: 1. Mild motion degraded exam. 2. Peripancreatic edema, mild. Similar 2 slightly decreased  compared to the prior MRI of 01/30/2020. Given elevated lipase, likely due to acute on chronic pancreatitis. 3. Pancreas divisum, without duct dilatation. 4. Cholecystectomy without choledocholithiasis. 5. Trace abdominal ascites. 6. Marked right renal atrophy. 7.  Aortic Atherosclerosis (ICD10-I70.0). Electronically Signed   By: Abigail Miyamoto M.D.   On: 05/30/2020 14:02    Discharge Instructions: Discharge Instructions     Call MD for:  persistant nausea and vomiting   Complete by: As directed    Call MD for:  severe uncontrolled pain   Complete by: As directed    Call MD for:  temperature >100.4   Complete by: As directed    Diet - low sodium heart healthy   Complete by: As directed    Discharge instructions   Complete by: As directed    Mr. Elver, Stadler were hospitalized due to acute on chronic pancreatitis. You were treated with IVF and pain medications. I want you to continue advancing your diet slowly, as tolerated. Please follow up with your GI doctor at [facility] in 1-2 weeks.   Thanks for allowing [place] to be a part of your care!   Increase activity slowly   Complete by: As directed        Signed: Iona Beard, MD 06/03/2020, 2:24 PM   Pager: [phone]

## 2020-06-03 NOTE — Discharge Instructions (Signed)
Acute Pancreatitis    The pancreas is a gland that is located behind the stomach on the left side of the abdomen. It produces enzymes that help to digest food. The pancreas also releases the hormones glucagon and insulin, which help to regulate blood sugar. Acute pancreatitis happens when inflammation of the pancreas suddenly occurs and the pancreas becomes irritated and swollen.  Most acute attacks last a few days and cause serious problems. Some people become dehydrated and develop low blood pressure. In severe cases, bleeding in the abdomen can lead to shock and can be life-threatening. The lungs, heart, and kidneys may fail.  What are the causes?  This condition may be caused by:  · Alcohol abuse.  · Drug abuse.  · Gallstones or other conditions that can block the tube that drains the pancreas (pancreatic duct).  · A tumor in the pancreas.  Other causes include:  · Certain medicines.  · Exposure to certain chemicals.  · Diabetes.  · An infection in the pancreas.  · Damage caused by an accident (trauma).  · The poison (venom) from a scorpion bite.  · Abdominal surgery.  · Autoimmune pancreatitis. This is when the body's disease-fighting (immune) system attacks the pancreas.  · Genes that are passed from parent to child (inherited).  In some cases, the cause of this condition is not known.  What are the signs or symptoms?  Symptoms of this condition include:  · Pain in the upper abdomen that may radiate to the back. Pain may be severe.  · Tenderness and swelling of the abdomen.  · Nausea and vomiting.  · Fever.  How is this diagnosed?  This condition may be diagnosed based on:  · A physical exam.  · Blood tests.  · Imaging tests, such as X-rays, CT or MRI scans, or an ultrasound of the abdomen.  How is this treated?  Treatment for this condition usually requires a stay in the hospital. Treatment for this condition may include:  · Pain medicine.  · Fluid replacement through an IV.  · Placing a tube in the stomach  to remove stomach contents and to control vomiting (NG tube, or nasogastric tube).  · Not eating for 3-4 days. This gives the pancreas a rest, because enzymes are not being produced that can cause further damage.  · Antibiotic medicines, if your condition is caused by an infection.  · Treating any underlying conditions that may be the cause.  · Steroid medicines, if your condition is caused by your immune system attacking your body's own tissues (autoimmune disease).  · Surgery on the pancreas or gallbladder.  Follow these instructions at home:  Eating and drinking    · Follow instructions from your health care provider about diet. This may involve avoiding alcohol and decreasing the amount of fat in your diet.  · Eat smaller, more frequent meals. This reduces the amount of digestive fluids that the pancreas produces.  · Drink enough fluid to keep your urine pale yellow.  · Do not drink alcohol if it caused your condition.  General instructions  · Take over-the-counter and prescription medicines only as told by your health care provider.  · Do not drive or use heavy machinery while taking prescription pain medicine.  · Ask your health care provider if the medicine prescribed to you can cause constipation. You may need to take steps to prevent or treat constipation, such as:  ? Take an over-the-counter or prescription medicine for constipation.  ? Eat   foods that are high in fiber such as whole grains and beans.  ? Limit foods that are high in fat and processed sugars, such as fried or sweet foods.  · Do not use any products that contain nicotine or tobacco, such as cigarettes, e-cigarettes, and chewing tobacco. If you need help quitting, ask your health care provider.  · Get plenty of rest.  · If directed, check your blood sugar at home as told by your health care provider.  · Keep all follow-up visits as told by your health care provider. This is important.  Contact a health care provider if you:  · Do not recover  as quickly as expected.  · Develop new or worsening symptoms.  · Have persistent pain, weakness, or nausea.  · Recover and then have another episode of pain.  · Have a fever.  Get help right away if:  · You cannot eat or keep fluids down.  · Your pain becomes severe.  · Your skin or the white part of your eyes turns yellow (jaundice).  · You have sudden swelling in your abdomen.  · You vomit.  · You feel dizzy or you faint.  · Your blood sugar is high (over 300 mg/dL).  Summary  · Acute pancreatitis happens when inflammation of the pancreas suddenly occurs and the pancreas becomes irritated and swollen.  · This condition is typically caused by alcohol abuse, drug abuse, or gallstones.  · Treatment for this condition usually requires a stay in the hospital.  This information is not intended to replace advice given to you by your health care provider. Make sure you discuss any questions you have with your health care provider.  Document Revised: 01/20/2018 Document Reviewed: 10/07/2017  Elsevier Patient Education © 2021 [employer]

## 2020-06-03 NOTE — Plan of Care (Signed)
Pt understanding of plan of care 

## 2020-06-03 NOTE — Progress Notes (Incomplete)
HD#4 Subjective:  Overnight Events: none  Patient evaluated at bedside. Notes that pain is currently tolerable and he is tolerating diet. He is able to drink as well. He did have a bowel movement this morning. Discussed recommendations for discharge today for which patient is agreeable to follow up with his PCP and GI at [facility].   Objective:  Vital signs in last 24 hours: Vitals:   06/02/20 1119 06/02/20 1715 06/02/20 2304 06/03/20 0500  BP: (!) 135/97 (!) 152/98 139/90 136/88  Pulse: 93 77 82 73  Resp: 20 15 16 16   Temp: 98.4 F (36.9 C) 98.1 F (36.7 C) 98.3 F (36.8 C) 98.2 F (36.8 C)  TempSrc: Oral Oral Oral Oral  SpO2: 100% 96% 97% 96%  Weight:    74.4 kg  Height:       Supplemental O2: Room Air SpO2: 96 %   Physical Exam:  Constitutional: Appears well-developed and well-nourished. Laying in bed no distress.  Cardiovascular: Normal rate, regular rhythm, S1 and S2 present, no murmurs, rubs, gallops.  Distal pulses intact Respiratory: No respiratory distress, no accessory muscle use. Lungs are clear to auscultation bilaterally. GI: TTP of the epigastrium, RUQ,  and LUQ, soft, active bowel sounds, no distension Musculoskeletal: Normal bulk and tone.  No peripheral edema noted. Neurological: Is alert and oriented x4, no apparent focal deficits noted. Skin: Warm and dry.    Filed Weights   06/01/20 0515 06/02/20 0531 06/03/20 0500  Weight: 74.4 kg 73.8 kg 74.4 kg     Intake/Output Summary (Last 24 hours) at 06/03/2020 0627 Last data filed at 06/03/2020 0504 Gross per 24 hour  Intake 240 ml  Output -  Net 240 ml   Net IO Since Admission: 4,243 mL [06/03/20 0627]  Pertinent Labs: CBC Latest Ref Rng & Units 06/02/2020 06/01/2020 05/31/2020  WBC 4.0 - 10.5 K/uL 6.6 6.0 5.4  Hemoglobin 13.0 - 17.0 g/dL 11.6(L) 11.4(L) 11.6(L)  Hematocrit 39.0 - 52.0 % 36.4(L) 35.5(L) 35.5(L)  Platelets 150 - 400 K/uL 221 192 186    CMP Latest Ref Rng & Units 06/02/2020 06/01/2020  05/31/2020  Glucose 70 - 99 mg/dL 120(H) 121(H) 102(H)  BUN 6 - 20 mg/dL 8 7 6   Creatinine 0.61 - 1.24 mg/dL 1.36(H) 1.15 1.18  Sodium 135 - 145 mmol/L 137 137 137  Potassium 3.5 - 5.1 mmol/L 3.7 3.7 3.5  Chloride 98 - 111 mmol/L 98 101 102  CO2 22 - 32 mmol/L 28 25 26   Calcium 8.9 - 10.3 mg/dL 9.5 9.1 8.8(L)  Total Protein 6.5 - 8.1 g/dL - - -  Total Bilirubin 0.3 - 1.2 mg/dL - - -  Alkaline Phos 38 - 126 U/L - - -  AST 15 - 41 U/L - - -  ALT 0 - 44 U/L - - -    Imaging: DG CHEST PORT 1 VIEW  Result Date: 05/29/2020 CLINICAL DATA:  Vomiting.  History of pancreatitis. EXAM: PORTABLE CHEST 1 VIEW COMPARISON:  Multiple exams, including CT abdomen 04/12/2020 and chest radiograph 10/13/2019 FINDINGS: Low lung volumes are present, causing crowding of the pulmonary vasculature. Linear subsegmental atelectasis or scarring along the left hemidiaphragm. The lungs appear otherwise clear. Cardiac and mediastinal margins appear normal. IMPRESSION: 1. Low lung volumes with minimal subsegmental atelectasis or scarring along the left hemidiaphragm. Electronically Signed   By: Van Clines M.D.   On: 05/29/2020 12:23   MRCP IMPRESSION: 1. Mild motion degraded exam. 2. Peripancreatic edema, mild. Similar 2 slightly decreased  compared to the prior MRI of 01/30/2020. Given elevated lipase, likely due to acute on chronic pancreatitis. 3. Pancreas divisum, without duct dilatation. 4. Cholecystectomy without choledocholithiasis. 5. Trace abdominal ascites. 6. Marked right renal atrophy. 7.  Aortic Atherosclerosis (ICD10-I70.0).  Assessment/Plan:   Principal Problem:   Acute on chronic pancreatitis (HCC) Active Problems:   Chronic pancreatitis ([place])   Pancreatic divisum   Acute pancreatitis   Pancreatitis  Patient Summary: Kyle Berry is a 50 year old male with PMHx of recurrent acute pancreatitis, pancreatic divisum with exocrine pancreatic insufficiency on pancreatic enzyme  replacement therapy, congential single kidney, nonobstructive kidney stones, stage I CKD, GERD, hypertension, hyperlipidemia  presenting with abdominal pain with nausea and vomiting admitted for acute pancreatitis.   Acute on chronic pancreatitis Pain similar to yesterday. Mild epigastric pain and LUQ pain on exam, otherwise soft with normal bowel sounds. He would like to try oral morphine today to see if that helps with pain. -morphine 15 mg q3 PRN -Phenergan 25 mg q 6 prn -Continue on regular diet - Senna -Strict I/Os -Monitor CMP   CKD stage I Congential single kidney Cr. Of 1.3 today. Appears well hydrated, is tolerating small amounts of fluid. - continue to monitor renal function   Hypertension -Continue losartan   GERD -Pepcid 20 mg twice daily   Diet: Regular Fluids: None DVT: lovenox Code: Full  Dispo: Anticipated discharge to Home in 0-1 days pending improvement in pain.  Iona Beard, MD 06/03/2020, 6:27 AM Pager: [phone]  Please contact the on call pager after 5 pm and on weekends at [phone].

## 2020-06-14 DIAGNOSIS — K3184 Gastroparesis: Secondary | ICD-10-CM

## 2020-06-14 HISTORY — DX: Gastroparesis: K31.84

## 2020-06-23 ENCOUNTER — Encounter (HOSPITAL_COMMUNITY): Payer: Self-pay | Admitting: Pharmacy Technician

## 2020-06-23 ENCOUNTER — Other Ambulatory Visit: Payer: Self-pay

## 2020-06-23 ENCOUNTER — Emergency Department (HOSPITAL_COMMUNITY)
Admission: EM | Admit: 2020-06-23 | Discharge: 2020-06-24 | Disposition: A | Discharge status: Home / Self Care | Payer: Managed Care, Other (non HMO) | Attending: Emergency Medicine | Admitting: Emergency Medicine

## 2020-06-23 DIAGNOSIS — R1013 Epigastric pain: Secondary | ICD-10-CM | POA: Diagnosis present

## 2020-06-23 DIAGNOSIS — I10 Essential (primary) hypertension: Secondary | ICD-10-CM | POA: Insufficient documentation

## 2020-06-23 DIAGNOSIS — Z8719 Personal history of other diseases of the digestive system: Secondary | ICD-10-CM | POA: Insufficient documentation

## 2020-06-23 DIAGNOSIS — K861 Other chronic pancreatitis: Secondary | ICD-10-CM | POA: Insufficient documentation

## 2020-06-23 LAB — COMPREHENSIVE METABOLIC PANEL
ALT: 25 U/L (ref 0–44)
AST: 29 U/L (ref 15–41)
Albumin: 4 g/dL (ref 3.5–5.0)
Alkaline Phosphatase: 65 U/L (ref 38–126)
Anion gap: 13 (ref 5–15)
BUN: 5 mg/dL — ABNORMAL LOW (ref 6–20)
CO2: 19 mmol/L — ABNORMAL LOW (ref 22–32)
Calcium: 9.7 mg/dL (ref 8.9–10.3)
Chloride: 102 mmol/L (ref 98–111)
Creatinine, Ser: 1.24 mg/dL (ref 0.61–1.24)
GFR, Estimated: >60 mL/min (ref >60)
Glucose, Bld: 115 mg/dL — ABNORMAL HIGH (ref 70–99)
Potassium: 3.6 mmol/L (ref 3.5–5.1)
Sodium: 134 mmol/L — ABNORMAL LOW (ref 135–145)
Total Bilirubin: 1.1 mg/dL (ref 0.3–1.2)
Total Protein: 7.6 g/dL (ref 6.5–8.1)

## 2020-06-23 LAB — CBC
HCT: 39.5 % (ref 39.0–52.0)
Hemoglobin: 13.5 g/dL (ref 13.0–17.0)
MCH: 32.5 pg (ref 26.0–34.0)
MCHC: 34.2 g/dL (ref 30.0–36.0)
MCV: 95 fL (ref 80.0–100.0)
Platelets: 213 10*3/uL (ref 150–400)
RBC: 4.16 MIL/uL — ABNORMAL LOW (ref 4.22–5.81)
RDW: 18.7 % — ABNORMAL HIGH (ref 11.5–15.5)
WBC: 7.5 10*3/uL (ref 4.0–10.5)
nRBC: 0 % (ref 0.0–0.2)

## 2020-06-23 LAB — LIPASE, BLOOD: Lipase: 162 U/L — ABNORMAL HIGH (ref 11–51)

## 2020-06-23 MED ORDER — ONDANSETRON 4 MG PO TBDP
4.0000 mg | ORAL_TABLET | Freq: Once | ORAL | Status: AC | PRN
  Administered 2020-06-24: 4 mg via ORAL
  Filled 2020-06-23: qty 1

## 2020-06-23 NOTE — ED Triage Notes (Signed)
Pt here with reports of upper abd pain. Hx pancreatitis. States pain started yesterday and endorses nausea and vomiting today.

## 2020-06-24 MED ORDER — HYDROMORPHONE HCL 1 MG/ML IJ SOLN
1.0000 mg | Freq: Once | INTRAMUSCULAR | Status: AC
  Administered 2020-06-24: 1 mg via INTRAVENOUS
  Filled 2020-06-24: qty 1

## 2020-06-24 MED ORDER — ONDANSETRON HCL 4 MG/2ML IJ SOLN
4.0000 mg | Freq: Once | INTRAMUSCULAR | Status: AC
  Administered 2020-06-24: 4 mg via INTRAVENOUS
  Filled 2020-06-24: qty 2

## 2020-06-24 MED ORDER — SODIUM CHLORIDE 0.9 % IV BOLUS
1000.0000 mL | Freq: Once | INTRAVENOUS | Status: AC
  Administered 2020-06-24: 1000 mL via INTRAVENOUS

## 2020-06-24 NOTE — Discharge Instructions (Addendum)
Continue your home pain medications. Follow-up with your GI specialist. Return here for new/acute changes.

## 2020-06-24 NOTE — ED Provider Notes (Signed)
[facility] EMERGENCY DEPARTMENT Provider Note   CSN: [id number] Arrival date & time: 06/23/20  1645     History Chief Complaint  Patient presents with  . Abdominal Pain    Kyle Berry is a 50 y.o. male.  The history is provided by the patient and medical records.    50 y.o. 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  Patient states he was recent seen by GI and pain management specialists at [facility].  He states he started new medication and was doing well earlier in the week but woke up yesterday morning with uncontrolled pain.  States he was trying to hydrate at home but then began vomiting and could not hold anything down.  He denies fever, chills.  Pain sharp, radiating into back which is typical for him.  States GI at [facility] advised he may ultimately need operation for correction but trying to maximize medical management first.  He denies any recent EtOH use.  Past Medical History:  Diagnosis Date  . Chronic bronchitis ([place])   . Chronic lower back pain   . Congenital single kidney   . GERD (gastroesophageal reflux disease)   . Headache    "once/month" (05/13/2017)  . High cholesterol   . Hypertension   . Migraine    "a couple/year" (05/13/2017)  . Nephrolithiasis 05/13/2017  . Pancreatitis   . Pneumonia ~ 09/2015  . Recurrent acute pancreatitis     Patient Active Problem List   Diagnosis Date Noted  . Pancreatitis 04/12/2020  . Urinary tract infection without hematuria   . Symptomatic anemia 11/05/2019  . Acute lower GI bleeding 11/02/2019  . Near syncope 11/02/2019  . AKI (acute kidney injury) ([place]) 11/02/2019  . Colonic ulcer   . Rectal bleeding 10/14/2019  . Pancreatic insufficiency 05/28/2019  . Recurrent acute pancreatitis 10/17/2018  . Microscopic hematuria 10/17/2018  . Acute pancreatitis 07/23/2018  . Pancreatic divisum   . Hematemesis  10/18/2017  . Recurrent pancreatitis 05/13/2017  . Congenital single kidney 05/13/2017  . High cholesterol 05/13/2017  . Kidney stone 05/13/2017  . Acute on chronic pancreatitis ([place]) 01/17/2017  . Hypokalemia   . Nausea and vomiting   . Essential hypertension   . Esophageal reflux   . Chronic pancreatitis ([place]) 11/23/2015    Past Surgical History:  Procedure Laterality Date  . CHOLECYSTECTOMY N/A 10/23/2017   Procedure: LAPAROSCOPIC CHOLECYSTECTOMY WITH INTRAOPERATIVE CHOLANGIOGRAM;  Surgeon: Johnathan Hausen, MD;  Location: [facility] ORS;  Service: General;  Laterality: N/A;  . COLONOSCOPY WITH PROPOFOL N/A 11/02/2019   Procedure: COLONOSCOPY WITH PROPOFOL;  Surgeon: Irene Shipper, MD;  Location: [facility] ENDOSCOPY;  Service: Endoscopy;  Laterality: N/A;  . ESOPHAGOGASTRODUODENOSCOPY (EGD) WITH PROPOFOL N/A 11/06/2019   Procedure: ESOPHAGOGASTRODUODENOSCOPY (EGD) WITH PROPOFOL;  Surgeon: Irene Shipper, MD;  Location: [facility] ENDOSCOPY;  Service: Endoscopy;  Laterality: N/A;  . HEMOSTASIS CLIP PLACEMENT  11/02/2019   Procedure: HEMOSTASIS CLIP PLACEMENT;  Surgeon: Irene Shipper, MD;  Location: [facility] ENDOSCOPY;  Service: Endoscopy;;  . NO PAST SURGERIES         Family History  Problem Relation Age of Onset  . Hypertension Other   . Hypertension Mother   . Hypertension Father   . Kidney disease Father   . Hypertension Sister   . Diabetes Sister   . Hypertension Brother   . Pancreatic cancer Paternal Grandmother   . Colon cancer Cousin   .  Stomach cancer Neg Hx   . Esophageal cancer Neg Hx     Social History   Tobacco Use  . Smoking status: Never Smoker  . Smokeless tobacco: Never Used  Vaping Use  . Vaping Use: Never used  Substance Use Topics  . Alcohol use: Not Currently    Comment: 05/13/2017 "might have beer a few times/year"  . Drug use: No    Home Medications Prior to Admission medications   Medication Sig Start Date End Date Taking? Authorizing Provider  famotidine (PEPCID) 20  MG tablet Take 1 tablet (20 mg total) by mouth 2 (two) times daily. Patient taking differently: Take 20 mg by mouth as needed for heartburn or indigestion. 02/02/20   Mikhail, Velta Addison, DO  lipase/protease/amylase (CREON) 36000 UNITS CPEP capsule Take 2 capsules (72,000 Units total) by mouth 3 (three) times daily with meals. 02/02/20   Mikhail, Velta Addison, DO  losartan (COZAAR) 25 MG tablet Take 25 mg by mouth daily. 03/17/20   [provider]  promethazine (PHENERGAN) 25 MG tablet Take 25 mg by mouth as needed for nausea or vomiting.    [provider]    Allergies    Diclofenac, Norvasc [amlodipine besylate], Omeprazole, and Prednisone  Review of Systems   Review of Systems  Gastrointestinal: Positive for abdominal pain, nausea and vomiting.  All other systems reviewed and are negative.   Physical Exam Updated Vital Signs BP (!) 164/113 (BP Location: Left Arm)   Pulse (!) 105   Temp 97.6 F (36.4 C) (Oral)   Resp 16   SpO2 100%   Physical Exam Vitals and nursing note reviewed.  Constitutional:      Appearance: He is well-developed.  HENT:     Head: Normocephalic and atraumatic.  Eyes:     Conjunctiva/sclera: Conjunctivae normal.     Pupils: Pupils are equal, round, and reactive to light.  Cardiovascular:     Rate and Rhythm: Normal rate and regular rhythm.     Heart sounds: Normal heart sounds.  Pulmonary:     Effort: Pulmonary effort is normal.     Breath sounds: Normal breath sounds.  Abdominal:     General: Bowel sounds are normal.     Palpations: Abdomen is soft.     Tenderness: There is abdominal tenderness in the epigastric area. There is no guarding or rebound.  Musculoskeletal:        General: Normal range of motion.     Cervical back: Normal range of motion.  Skin:    General: Skin is warm and dry.  Neurological:     Mental Status: He is alert and oriented to person, place, and time.     ED Results / Procedures / Treatments   Labs (all  labs ordered are listed, but only abnormal results are displayed) Labs Reviewed  LIPASE, BLOOD - Abnormal; Notable for the following components:      Result Value   Lipase 162 (*)    All other components within normal limits  COMPREHENSIVE METABOLIC PANEL - Abnormal; Notable for the following components:   Sodium 134 (*)    CO2 19 (*)    Glucose, Bld 115 (*)    BUN 5 (*)    All other components within normal limits  CBC - Abnormal; Notable for the following components:   RBC 4.16 (*)    RDW 18.7 (*)    All other components within normal limits  URINALYSIS, ROUTINE W REFLEX MICROSCOPIC    EKG None  Radiology  No results found.  Procedures Procedures   Medications Ordered in ED Medications  ondansetron (ZOFRAN-ODT) disintegrating tablet 4 mg (4 mg Oral Given 06/24/20 0258)  sodium chloride 0.9 % bolus 1,000 mL (0 mLs Intravenous Stopped 06/24/20 0236)  HYDROmorphone (DILAUDID) injection 1 mg (1 mg Intravenous Given 06/24/20 0126)  ondansetron (ZOFRAN) injection 4 mg (4 mg Intravenous Given 06/24/20 0126)  HYDROmorphone (DILAUDID) injection 1 mg (1 mg Intravenous Given 06/24/20 0257)  ondansetron (ZOFRAN) injection 4 mg (4 mg Intravenous Given 06/24/20 0407)  HYDROmorphone (DILAUDID) injection 1 mg (1 mg Intravenous Given 06/24/20 0407)    ED Course  I have reviewed the triage vital signs and the nursing notes.  Pertinent labs & imaging results that were available during my care of the patient were reviewed by me and considered in my medical decision making (see chart for details).    MDM Rules/Calculators/A&P  50 y.o. M here with epigastric abdominal pain.  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  Recently seen by new GI specialist at [facility], attempting to maximize medical therapy.  Also now in pain management.  He is afebrile, non-toxic in appearance here.  Some tenderness in epigastrium without peritoneal signs.  Labs as above-- lipase 162, otherwise largely  reassuring.  Will give IVF, pain, nausea meds.  Had recent CT and MRCP within the past month, do not feel he needs repeat imaging at this time.  4:21 AM 3 doses of pain medication given along with IVF.  He appears more comfortable.  No further emesis, has tolerated full cup of ginger ale.  VSS.  Feel he is stable for discharge home.  Will need to follow-up with his GI specialist at [facility].  Continue his home pain medication regimen.  Return here for new concerns.  Final Clinical Impression(s) / ED Diagnoses Final diagnoses:  Idiopathic chronic pancreatitis [facility])    Rx / [place] Orders ED Discharge Orders    None       Larene Pickett, PA-C 06/24/20 [place], [place], MD 06/24/20 [phone]

## 2020-06-24 NOTE — ED Notes (Signed)
Patient c/o increased abd. Pain after eating ice chips.

## 2020-07-21 ENCOUNTER — Other Ambulatory Visit: Payer: Self-pay

## 2020-07-21 ENCOUNTER — Emergency Department (HOSPITAL_COMMUNITY): Payer: Managed Care, Other (non HMO)

## 2020-07-21 ENCOUNTER — Emergency Department (HOSPITAL_COMMUNITY)
Admission: EM | Admit: 2020-07-21 | Discharge: 2020-07-21 | Disposition: A | Discharge status: Home / Self Care | Payer: Managed Care, Other (non HMO) | Attending: Emergency Medicine | Admitting: Emergency Medicine

## 2020-07-21 DIAGNOSIS — R1012 Left upper quadrant pain: Secondary | ICD-10-CM | POA: Diagnosis present

## 2020-07-21 DIAGNOSIS — K859 Acute pancreatitis without necrosis or infection, unspecified: Secondary | ICD-10-CM | POA: Diagnosis not present

## 2020-07-21 DIAGNOSIS — I10 Essential (primary) hypertension: Secondary | ICD-10-CM | POA: Insufficient documentation

## 2020-07-21 DIAGNOSIS — Z79899 Other long term (current) drug therapy: Secondary | ICD-10-CM | POA: Insufficient documentation

## 2020-07-21 LAB — CBC
HCT: 39.9 % (ref 39.0–52.0)
Hemoglobin: 13.1 g/dL (ref 13.0–17.0)
MCH: 31.2 pg (ref 26.0–34.0)
MCHC: 32.8 g/dL (ref 30.0–36.0)
MCV: 95 fL (ref 80.0–100.0)
Platelets: 208 10*3/uL (ref 150–400)
RBC: 4.2 MIL/uL — ABNORMAL LOW (ref 4.22–5.81)
RDW: 17 % — ABNORMAL HIGH (ref 11.5–15.5)
WBC: 8.4 10*3/uL (ref 4.0–10.5)
nRBC: 0 % (ref 0.0–0.2)

## 2020-07-21 LAB — URINALYSIS, ROUTINE W REFLEX MICROSCOPIC
Bilirubin Urine: NEGATIVE
Glucose, UA: NEGATIVE mg/dL
Hgb urine dipstick: NEGATIVE
Ketones, ur: 20 mg/dL — AB
Leukocytes,Ua: NEGATIVE
Nitrite: NEGATIVE
Protein, ur: 100 mg/dL — AB
Specific Gravity, Urine: 1.032 — ABNORMAL HIGH (ref 1.005–1.030)
pH: 5 (ref 5.0–8.0)

## 2020-07-21 LAB — COMPREHENSIVE METABOLIC PANEL
ALT: 32 U/L (ref 0–44)
AST: 52 U/L — ABNORMAL HIGH (ref 15–41)
Albumin: 4 g/dL (ref 3.5–5.0)
Alkaline Phosphatase: 62 U/L (ref 38–126)
Anion gap: 11 (ref 5–15)
BUN: 7 mg/dL (ref 6–20)
CO2: 23 mmol/L (ref 22–32)
Calcium: 9 mg/dL (ref 8.9–10.3)
Chloride: 99 mmol/L (ref 98–111)
Creatinine, Ser: 1.27 mg/dL — ABNORMAL HIGH (ref 0.61–1.24)
GFR, Estimated: >60 mL/min (ref >60)
Glucose, Bld: 137 mg/dL — ABNORMAL HIGH (ref 70–99)
Potassium: 3.2 mmol/L — ABNORMAL LOW (ref 3.5–5.1)
Sodium: 133 mmol/L — ABNORMAL LOW (ref 135–145)
Total Bilirubin: 1.8 mg/dL — ABNORMAL HIGH (ref 0.3–1.2)
Total Protein: 7.4 g/dL (ref 6.5–8.1)

## 2020-07-21 LAB — LIPASE, BLOOD: Lipase: 128 U/L — ABNORMAL HIGH (ref 11–51)

## 2020-07-21 MED ORDER — ONDANSETRON HCL 4 MG/2ML IJ SOLN
4.0000 mg | Freq: Once | INTRAMUSCULAR | Status: AC
  Administered 2020-07-21: 4 mg via INTRAVENOUS
  Filled 2020-07-21: qty 2

## 2020-07-21 MED ORDER — IOHEXOL 300 MG/ML  SOLN
100.0000 mL | Freq: Once | INTRAMUSCULAR | Status: AC | PRN
  Administered 2020-07-21: 80 mL via INTRAVENOUS

## 2020-07-21 MED ORDER — SODIUM CHLORIDE 0.9 % IV BOLUS
1000.0000 mL | Freq: Once | INTRAVENOUS | Status: AC
  Administered 2020-07-21: 1000 mL via INTRAVENOUS

## 2020-07-21 MED ORDER — OXYCODONE HCL 5 MG PO TABS: 5.0000 mg | ORAL_TABLET | Freq: Four times a day (QID) | ORAL | 0 refills | Status: DC | PRN

## 2020-07-21 MED ORDER — ONDANSETRON 4 MG PO TBDP: 4.0000 mg | ORAL_TABLET | Freq: Three times a day (TID) | ORAL | 1 refills | Status: DC | PRN

## 2020-07-21 MED ORDER — HYDROMORPHONE HCL 1 MG/ML IJ SOLN
1.0000 mg | Freq: Once | INTRAMUSCULAR | Status: AC
  Administered 2020-07-21: 1 mg via INTRAVENOUS
  Filled 2020-07-21: qty 1

## 2020-07-21 MED ORDER — SODIUM CHLORIDE 0.9 % IV SOLN: INTRAVENOUS | Status: DC

## 2020-07-21 NOTE — ED Provider Notes (Signed)
[facility] EMERGENCY DEPARTMENT Provider Note   CSN: [id number] Arrival date & time: 07/21/20  1045     History Chief Complaint  Patient presents with  . Abdominal Pain    Kyle Berry is a 50 y.o. male.  Patient with a history of pancreatitis.  Patient with onset of some left upper quadrant abdominal pain and nausea and vomiting no real diarrhea.  It started in his back area yesterday got much worse today.  Patient with persistent nausea no blood in the vomit.  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  And gets pancreatitis denies any alcohol use.  Patient denies any fevers.        Past Medical History:  Diagnosis Date  . Chronic bronchitis ([place])   . Chronic lower back pain   . Congenital single kidney   . GERD (gastroesophageal reflux disease)   . Headache    "once/month" (05/13/2017)  . High cholesterol   . Hypertension   . Migraine    "a couple/year" (05/13/2017)  . Nephrolithiasis 05/13/2017  . Pancreatitis   . Pneumonia ~ 09/2015  . Recurrent acute pancreatitis     Patient Active Problem List   Diagnosis Date Noted  . Pancreatitis 04/12/2020  . Urinary tract infection without hematuria   . Symptomatic anemia 11/05/2019  . Acute lower GI bleeding 11/02/2019  . Near syncope 11/02/2019  . AKI (acute kidney injury) ([place]) 11/02/2019  . Colonic ulcer   . Rectal bleeding 10/14/2019  . Pancreatic insufficiency 05/28/2019  . Recurrent acute pancreatitis 10/17/2018  . Microscopic hematuria 10/17/2018  . Acute pancreatitis 07/23/2018  . Pancreatic divisum   . Hematemesis 10/18/2017  . Recurrent pancreatitis 05/13/2017  . Congenital single kidney 05/13/2017  . High cholesterol 05/13/2017  . Kidney stone 05/13/2017  . Acute on chronic pancreatitis ([place]) 01/17/2017  . Hypokalemia   . Nausea and vomiting   . Essential hypertension   . Esophageal reflux   . Chronic pancreatitis ([place]) 11/23/2015    Past Surgical History:  Procedure  Laterality Date  . CHOLECYSTECTOMY N/A 10/23/2017   Procedure: LAPAROSCOPIC CHOLECYSTECTOMY WITH INTRAOPERATIVE CHOLANGIOGRAM;  Surgeon: Johnathan Hausen, MD;  Location: [facility] ORS;  Service: General;  Laterality: N/A;  . COLONOSCOPY WITH PROPOFOL N/A 11/02/2019   Procedure: COLONOSCOPY WITH PROPOFOL;  Surgeon: Irene Shipper, MD;  Location: [facility] ENDOSCOPY;  Service: Endoscopy;  Laterality: N/A;  . ESOPHAGOGASTRODUODENOSCOPY (EGD) WITH PROPOFOL N/A 11/06/2019   Procedure: ESOPHAGOGASTRODUODENOSCOPY (EGD) WITH PROPOFOL;  Surgeon: Irene Shipper, MD;  Location: [facility] ENDOSCOPY;  Service: Endoscopy;  Laterality: N/A;  . HEMOSTASIS CLIP PLACEMENT  11/02/2019   Procedure: HEMOSTASIS CLIP PLACEMENT;  Surgeon: Irene Shipper, MD;  Location: [facility] ENDOSCOPY;  Service: Endoscopy;;  . NO PAST SURGERIES         Family History  Problem Relation Age of Onset  . Hypertension Other   . Hypertension Mother   . Hypertension Father   . Kidney disease Father   . Hypertension Sister   . Diabetes Sister   . Hypertension Brother   . Pancreatic cancer Paternal Grandmother   . Colon cancer Cousin   . Stomach cancer Neg Hx   . Esophageal cancer Neg Hx     Social History   Tobacco Use  . Smoking status: Never Smoker  . Smokeless tobacco: Never Used  Vaping Use  . Vaping Use: Never used  Substance Use Topics  . Alcohol use: Not Currently    Comment: 05/13/2017 "might have beer a  few times/year"  . Drug use: No    Home Medications Prior to Admission medications   Medication Sig Start Date End Date Taking? Authorizing Provider  famotidine (PEPCID) 20 MG tablet Take 20 mg by mouth daily as needed for heartburn or indigestion.   Yes [provider]  lipase/protease/amylase (CREON) 36000 UNITS CPEP capsule Take 2 capsules (72,000 Units total) by mouth 3 (three) times daily with meals. 02/02/20  Yes Mikhail, Maryann, DO  losartan (COZAAR) 25 MG tablet Take 25 mg by mouth daily. 03/17/20  Yes [provider]  ondansetron (ZOFRAN ODT) 4 MG disintegrating tablet Take 1 tablet (4 mg total) by mouth every 8 (eight) hours as needed for nausea or vomiting. 07/21/20  Yes Fredia Sorrow, MD  promethazine (PHENERGAN) 25 MG tablet Take 25 mg by mouth every 6 (six) hours as needed for nausea or vomiting.   Yes [provider]  famotidine (PEPCID) 20 MG tablet Take 1 tablet (20 mg total) by mouth 2 (two) times daily. Patient not taking: No sig reported 02/02/20   Cristal Ford, DO    Allergies    Diclofenac, Norvasc [amlodipine besylate], Omeprazole, and Prednisone  Review of Systems   Review of Systems  Constitutional: Negative for chills and fever.  HENT: Negative for rhinorrhea and sore throat.   Eyes: Negative for visual disturbance.  Respiratory: Negative for cough and shortness of breath.   Cardiovascular: Negative for chest pain and leg swelling.  Gastrointestinal: Positive for abdominal pain, nausea and vomiting. Negative for diarrhea.  Genitourinary: Negative for dysuria.  Musculoskeletal: Negative for back pain and neck pain.  Skin: Negative for rash.  Neurological: Negative for dizziness, light-headedness and headaches.  Hematological: Does not bruise/bleed easily.  Psychiatric/Behavioral: Negative for confusion.    Physical Exam Updated Vital Signs BP (!) 149/89 (BP Location: Right Arm)   Pulse 74   Temp 98 F (36.7 C) (Oral)   Resp 18   SpO2 98%   Physical Exam Vitals and nursing note reviewed.  Constitutional:      Appearance: He is well-developed.  HENT:     Head: Normocephalic and atraumatic.  Eyes:     Extraocular Movements: Extraocular movements intact.     Conjunctiva/sclera: Conjunctivae normal.     Pupils: Pupils are equal, round, and reactive to light.  Cardiovascular:     Rate and Rhythm: Normal rate and regular rhythm.     Heart sounds: No murmur heard.   Pulmonary:     Effort: Pulmonary effort is normal. No respiratory distress.     Breath  sounds: Normal breath sounds.  Abdominal:     Palpations: Abdomen is soft.     Tenderness: There is abdominal tenderness.     Comments: Some tenderness left upper quadrant.  No guarding.  Musculoskeletal:        General: Normal range of motion.     Cervical back: Neck supple.  Skin:    General: Skin is warm and dry.     Capillary Refill: Capillary refill takes less than 2 seconds.  Neurological:     General: No focal deficit present.     Mental Status: He is alert and oriented to person, place, and time.     ED Results / Procedures / Treatments   Labs (all labs ordered are listed, but only abnormal results are displayed) Labs Reviewed  LIPASE, BLOOD - Abnormal; Notable for the following components:      Result Value   Lipase 128 (*)  All other components within normal limits  COMPREHENSIVE METABOLIC PANEL - Abnormal; Notable for the following components:   Sodium 133 (*)    Potassium 3.2 (*)    Glucose, Bld 137 (*)    Creatinine, Ser 1.27 (*)    AST 52 (*)    Total Bilirubin 1.8 (*)    All other components within normal limits  CBC - Abnormal; Notable for the following components:   RBC 4.20 (*)    RDW 17.0 (*)    All other components within normal limits  URINALYSIS, ROUTINE W REFLEX MICROSCOPIC - Abnormal; Notable for the following components:   Color, Urine AMBER (*)    APPearance HAZY (*)    Specific Gravity, Urine 1.032 (*)    Ketones, ur 20 (*)    Protein, ur 100 (*)    Bacteria, UA RARE (*)    All other components within normal limits    EKG None  Radiology CT Abdomen Pelvis W Contrast  Result Date: 07/21/2020 CLINICAL DATA:  Left upper quadrant abdominal pain. EXAM: CT ABDOMEN AND PELVIS WITH CONTRAST TECHNIQUE: Multidetector CT imaging of the abdomen and pelvis was performed using the standard protocol following bolus administration of intravenous contrast. CONTRAST:  75mL OMNIPAQUE IOHEXOL 300 MG/ML  SOLN COMPARISON:  April 12, 2020. FINDINGS: Lower  chest: No acute abnormality. Hepatobiliary: No focal liver abnormality is seen. Status post cholecystectomy. No biliary dilatation. Pancreas: Stable calcification is seen in the uncinate process pancreatic head. Mild inflammatory changes are noted around the pancreas suggesting acute pancreatitis. No significant ductal dilatation is noted. No pseudocyst formation is noted. Small amount of free fluid is noted in the left pericolic gutter. Spleen: Normal in size without focal abnormality. Adrenals/Urinary Tract: Adrenal glands appear normal. Severe right renal atrophy is again noted. Nonobstructive left nephrolithiasis is noted. No hydronephrosis or renal obstruction is noted. The urinary bladder is unremarkable. Stomach/Bowel: Stomach is within normal limits. Appendix appears normal. No evidence of bowel wall thickening, distention, or inflammatory changes. Vascular/Lymphatic: No significant vascular findings are present. No enlarged abdominal or pelvic lymph nodes. Reproductive: Prostate is unremarkable. Other: No abdominal wall hernia or abnormality. No abdominopelvic ascites. Musculoskeletal: No acute or significant osseous findings. IMPRESSION: Mild inflammatory changes are noted around the pancreas consistent with acute pancreatitis. No pseudocyst formation is noted Severe right renal atrophy is again noted. Nonobstructive left renal calculus. No hydronephrosis or renal obstruction is noted. Electronically Signed   By: Marijo Conception M.D.   On: 07/21/2020 15:41    Procedures Procedures   Medications Ordered in ED Medications  0.9 %  sodium chloride infusion ( Intravenous New Bag/Given 07/21/20 1310)  ondansetron (ZOFRAN) injection 4 mg (has no administration in time range)  HYDROmorphone (DILAUDID) injection 1 mg (has no administration in time range)  sodium chloride 0.9 % bolus 1,000 mL (1,000 mLs Intravenous New Bag/Given 07/21/20 1311)  ondansetron (ZOFRAN) injection 4 mg (4 mg Intravenous Given  07/21/20 1310)  HYDROmorphone (DILAUDID) injection 1 mg (1 mg Intravenous Given 07/21/20 1310)  iohexol (OMNIPAQUE) 300 MG/ML solution 100 mL (80 mLs Intravenous Contrast Given 07/21/20 1514)    ED Course  I have reviewed the triage vital signs and the nursing notes.  Pertinent labs & imaging results that were available during my care of the patient were reviewed by me and considered in my medical decision making (see chart for details).    MDM Rules/Calculators/A&P  CT scan consistent with pancreatitis without any complicating factors.  Lipase is elevated.  Patient improved with pain medicine but will need a dose of pain medicine prior to discharge.  Will discharge home on clear liquid diet follow-up with his primary care doctor prescription for oxycodone and a prescription for Zofran.  Patient will return for any new or worse symptoms.  Work note provided.    Final Clinical Impression(s) / ED Diagnoses Final diagnoses:  Acute pancreatitis without infection or necrosis, unspecified pancreatitis type    Rx / [place] Orders ED Discharge Orders         Ordered    ondansetron (ZOFRAN ODT) 4 MG disintegrating tablet  Every 8 hours PRN        07/21/20 1558           Fredia Sorrow, MD 07/21/20 1603

## 2020-07-21 NOTE — ED Notes (Signed)
In CT now.

## 2020-07-21 NOTE — ED Triage Notes (Signed)
Pt with hx of pancreatitis here for eval of LUQ abdominal pain, radiating to L flank since yesterday while at work. Endorses n/v/d.

## 2020-07-21 NOTE — ED Notes (Signed)
Discharge instructions discussed with pt. Pt verbalized understand with no questions at this time. Pt going home with daughter. Informed he could not drive home.

## 2020-07-21 NOTE — Discharge Instructions (Signed)
CT scan consistent with pancreatitis.  No complicating factors.  Recommend clear liquid diet.  Take the Zofran as needed for nausea and vomiting.  Take the oxycodone as needed for pain.  Make an appointment to follow-up with your doctor.  Return for any new or worse symptoms.  Work note provided.

## 2020-08-21 ENCOUNTER — Emergency Department (HOSPITAL_COMMUNITY)
Admission: EM | Admit: 2020-08-21 | Discharge: 2020-08-21 | Disposition: A | Discharge status: Home / Self Care | Payer: Managed Care, Other (non HMO) | Attending: Emergency Medicine | Admitting: Emergency Medicine

## 2020-08-21 ENCOUNTER — Other Ambulatory Visit: Payer: Self-pay

## 2020-08-21 ENCOUNTER — Telehealth: Payer: Self-pay

## 2020-08-21 ENCOUNTER — Encounter (HOSPITAL_COMMUNITY): Payer: Self-pay

## 2020-08-21 DIAGNOSIS — Z9889 Other specified postprocedural states: Secondary | ICD-10-CM

## 2020-08-21 DIAGNOSIS — R1013 Epigastric pain: Secondary | ICD-10-CM | POA: Insufficient documentation

## 2020-08-21 DIAGNOSIS — K219 Gastro-esophageal reflux disease without esophagitis: Secondary | ICD-10-CM | POA: Diagnosis not present

## 2020-08-21 DIAGNOSIS — I10 Essential (primary) hypertension: Secondary | ICD-10-CM | POA: Diagnosis not present

## 2020-08-21 DIAGNOSIS — R110 Nausea: Secondary | ICD-10-CM | POA: Diagnosis not present

## 2020-08-21 DIAGNOSIS — R112 Nausea with vomiting, unspecified: Secondary | ICD-10-CM

## 2020-08-21 DIAGNOSIS — K859 Acute pancreatitis without necrosis or infection, unspecified: Secondary | ICD-10-CM

## 2020-08-21 LAB — CBC
HCT: 42.6 % (ref 39.0–52.0)
Hemoglobin: 13.8 g/dL (ref 13.0–17.0)
MCH: 31.5 pg (ref 26.0–34.0)
MCHC: 32.4 g/dL (ref 30.0–36.0)
MCV: 97.3 fL (ref 80.0–100.0)
Platelets: 186 10*3/uL (ref 150–400)
RBC: 4.38 MIL/uL (ref 4.22–5.81)
RDW: 17 % — ABNORMAL HIGH (ref 11.5–15.5)
WBC: 14 10*3/uL — ABNORMAL HIGH (ref 4.0–10.5)
nRBC: 0 % (ref 0.0–0.2)

## 2020-08-21 LAB — COMPREHENSIVE METABOLIC PANEL
ALT: 26 U/L (ref 0–44)
AST: 32 U/L (ref 15–41)
Albumin: 3.8 g/dL (ref 3.5–5.0)
Alkaline Phosphatase: 54 U/L (ref 38–126)
Anion gap: 10 (ref 5–15)
BUN: 8 mg/dL (ref 6–20)
CO2: 26 mmol/L (ref 22–32)
Calcium: 8.8 mg/dL — ABNORMAL LOW (ref 8.9–10.3)
Chloride: 101 mmol/L (ref 98–111)
Creatinine, Ser: 1.31 mg/dL — ABNORMAL HIGH (ref 0.61–1.24)
GFR, Estimated: >60 mL/min (ref >60)
Glucose, Bld: 129 mg/dL — ABNORMAL HIGH (ref 70–99)
Potassium: 3.2 mmol/L — ABNORMAL LOW (ref 3.5–5.1)
Sodium: 137 mmol/L (ref 135–145)
Total Bilirubin: 1.4 mg/dL — ABNORMAL HIGH (ref 0.3–1.2)
Total Protein: 7.4 g/dL (ref 6.5–8.1)

## 2020-08-21 LAB — LIPASE, BLOOD: Lipase: 156 U/L — ABNORMAL HIGH (ref 11–51)

## 2020-08-21 MED ORDER — SODIUM CHLORIDE 0.9 % IV SOLN
25.0000 mg | Freq: Four times a day (QID) | INTRAVENOUS | Status: DC | PRN
  Filled 2020-08-21: qty 1

## 2020-08-21 MED ORDER — HYDROMORPHONE HCL 1 MG/ML IJ SOLN
1.0000 mg | Freq: Once | INTRAMUSCULAR | Status: AC
  Administered 2020-08-21: 1 mg via INTRAVENOUS
  Filled 2020-08-21: qty 1

## 2020-08-21 MED ORDER — OXYCODONE-ACETAMINOPHEN 5-325 MG PO TABS: 1.0000 | ORAL_TABLET | Freq: Four times a day (QID) | ORAL | 0 refills | Status: DC | PRN

## 2020-08-21 MED ORDER — METOCLOPRAMIDE HCL 5 MG/ML IJ SOLN
10.0000 mg | Freq: Once | INTRAMUSCULAR | Status: AC
  Administered 2020-08-21: 10 mg via INTRAVENOUS
  Filled 2020-08-21: qty 2

## 2020-08-21 MED ORDER — LACTATED RINGERS IV BOLUS
1000.0000 mL | Freq: Once | INTRAVENOUS | Status: AC
  Administered 2020-08-21: 1000 mL via INTRAVENOUS

## 2020-08-21 MED ORDER — OXYCODONE-ACETAMINOPHEN 5-325 MG PO TABS
2.0000 | ORAL_TABLET | Freq: Once | ORAL | Status: AC
  Administered 2020-08-21: 2 via ORAL
  Filled 2020-08-21: qty 2

## 2020-08-21 NOTE — ED Provider Notes (Signed)
[name] EMERGENCY DEPARTMENT Provider Note   CSN: [id number] Arrival date & time: 08/21/20  9833     History Chief Complaint  Patient presents with  . Abdominal Pain    Kyle Berry is a 50 y.o. male.  50 year old male with a history of recurrent pancreatitis recently found to have pancreatic divisum the recently had a ERCP done and stents placed at [facility] on Friday.  Patient says he was doing okay till Saturday morning started having worsening epigastric pain but apparently this was expected for the procedure.  However later in the day and night he started having nonbloody nonbilious emesis but one time it had the appearance of coffee grounds.  He states that he so nauseous he is unable to keep his pain medication down so everything is kind of exacerbated.  He has not had any fevers.  No diarrhea.  No sick contacts.  No trauma.  No other associated symptoms.     Abdominal Pain      Past Medical History:  Diagnosis Date  . Chronic bronchitis ([place])   . Chronic lower back pain   . Congenital single kidney   . GERD (gastroesophageal reflux disease)   . Headache    "once/month" (05/13/2017)  . High cholesterol   . Hypertension   . Migraine    "a couple/year" (05/13/2017)  . Nephrolithiasis 05/13/2017  . Pancreatitis   . Pneumonia ~ 09/2015  . Recurrent acute pancreatitis     Patient Active Problem List   Diagnosis Date Noted  . Pancreatitis 04/12/2020  . Urinary tract infection without hematuria   . Symptomatic anemia 11/05/2019  . Acute lower GI bleeding 11/02/2019  . Near syncope 11/02/2019  . AKI (acute kidney injury) ([place]) 11/02/2019  . Colonic ulcer   . Rectal bleeding 10/14/2019  . Pancreatic insufficiency 05/28/2019  . Recurrent acute pancreatitis 10/17/2018  . Microscopic hematuria 10/17/2018  . Acute pancreatitis 07/23/2018  . Pancreatic divisum   . Hematemesis 10/18/2017  . Recurrent pancreatitis 05/13/2017  . Congenital single  kidney 05/13/2017  . High cholesterol 05/13/2017  . Kidney stone 05/13/2017  . Acute on chronic pancreatitis ([place]) 01/17/2017  . Hypokalemia   . Nausea and vomiting   . Essential hypertension   . Esophageal reflux   . Chronic pancreatitis ([place]) 11/23/2015    Past Surgical History:  Procedure Laterality Date  . CHOLECYSTECTOMY N/A 10/23/2017   Procedure: LAPAROSCOPIC CHOLECYSTECTOMY WITH INTRAOPERATIVE CHOLANGIOGRAM;  Surgeon: Johnathan Hausen, MD;  Location: [facility] ORS;  Service: General;  Laterality: N/A;  . COLONOSCOPY WITH PROPOFOL N/A 11/02/2019   Procedure: COLONOSCOPY WITH PROPOFOL;  Surgeon: Irene Shipper, MD;  Location: [facility] ENDOSCOPY;  Service: Endoscopy;  Laterality: N/A;  . ESOPHAGOGASTRODUODENOSCOPY (EGD) WITH PROPOFOL N/A 11/06/2019   Procedure: ESOPHAGOGASTRODUODENOSCOPY (EGD) WITH PROPOFOL;  Surgeon: Irene Shipper, MD;  Location: [facility] ENDOSCOPY;  Service: Endoscopy;  Laterality: N/A;  . HEMOSTASIS CLIP PLACEMENT  11/02/2019   Procedure: HEMOSTASIS CLIP PLACEMENT;  Surgeon: Irene Shipper, MD;  Location: [facility] ENDOSCOPY;  Service: Endoscopy;;  . NO PAST SURGERIES         Family History  Problem Relation Age of Onset  . Hypertension Other   . Hypertension Mother   . Hypertension Father   . Kidney disease Father   . Hypertension Sister   . Diabetes Sister   . Hypertension Brother   . Pancreatic cancer Paternal Grandmother   . Colon cancer Cousin   . Stomach cancer Neg Hx   .  Esophageal cancer Neg Hx     Social History   Tobacco Use  . Smoking status: Never Smoker  . Smokeless tobacco: Never Used  Vaping Use  . Vaping Use: Never used  Substance Use Topics  . Alcohol use: Not Currently    Comment: 05/13/2017 "might have beer a few times/year"  . Drug use: No    Home Medications Prior to Admission medications   Medication Sig Start Date End Date Taking? Authorizing Provider  famotidine (PEPCID) 20 MG tablet Take 1 tablet (20 mg total) by mouth 2 (two) times  daily. Patient not taking: No sig reported 02/02/20   Cristal Ford, DO  famotidine (PEPCID) 20 MG tablet Take 20 mg by mouth daily as needed for heartburn or indigestion.    [provider]  lipase/protease/amylase (CREON) 36000 UNITS CPEP capsule Take 2 capsules (72,000 Units total) by mouth 3 (three) times daily with meals. 02/02/20   Mikhail, Velta Addison, DO  losartan (COZAAR) 25 MG tablet Take 25 mg by mouth daily. 03/17/20   [provider]  ondansetron (ZOFRAN ODT) 4 MG disintegrating tablet Take 1 tablet (4 mg total) by mouth every 8 (eight) hours as needed for nausea or vomiting. 07/21/20   Fredia Sorrow, MD  oxyCODONE (ROXICODONE) 5 MG immediate release tablet Take 1 tablet (5 mg total) by mouth every 6 (six) hours as needed for moderate pain or severe pain. 07/21/20   Fredia Sorrow, MD  promethazine (PHENERGAN) 25 MG tablet Take 25 mg by mouth every 6 (six) hours as needed for nausea or vomiting.    [provider]    Allergies    Diclofenac, Norvasc [amlodipine besylate], Omeprazole, and Prednisone  Review of Systems   Review of Systems  Gastrointestinal: Positive for abdominal pain.  All other systems reviewed and are negative.   Physical Exam Updated Vital Signs BP (!) 138/102 (BP Location: Right Arm)   Pulse 92   Temp 97.9 F (36.6 C)   Resp 17   Ht 6\' 1"  (1.854 m)   Wt 76.2 kg   SpO2 100%   BMI 22.16 kg/m   Physical Exam Vitals and nursing note reviewed.  Constitutional:      Appearance: He is well-developed.  HENT:     Head: Normocephalic and atraumatic.     Nose: No congestion or rhinorrhea.     Mouth/Throat:     Mouth: Mucous membranes are moist.     Pharynx: Oropharynx is clear.  Eyes:     Pupils: Pupils are equal, round, and reactive to light.  Cardiovascular:     Rate and Rhythm: Normal rate.  Pulmonary:     Effort: Pulmonary effort is normal. No respiratory distress.  Abdominal:     General: Abdomen is flat. There is  no distension.     Tenderness: There is abdominal tenderness.  Musculoskeletal:        General: No swelling or tenderness. Normal range of motion.     Cervical back: Normal range of motion.  Skin:    General: Skin is warm and dry.  Neurological:     General: No focal deficit present.     Mental Status: He is alert.     ED Results / Procedures / Treatments   Labs (all labs ordered are listed, but only abnormal results are displayed) Labs Reviewed  LIPASE, BLOOD - Abnormal; Notable for the following components:      Result Value   Lipase 156 (*)    All other components within  normal limits  COMPREHENSIVE METABOLIC PANEL - Abnormal; Notable for the following components:   Potassium 3.2 (*)    Glucose, Bld 129 (*)    Creatinine, Ser 1.31 (*)    Calcium 8.8 (*)    Total Bilirubin 1.4 (*)    All other components within normal limits  CBC - Abnormal; Notable for the following components:   WBC 14.0 (*)    RDW 17.0 (*)    All other components within normal limits  URINALYSIS, ROUTINE W REFLEX MICROSCOPIC    EKG None  Radiology No results found.  Procedures Procedures   Medications Ordered in ED Medications  oxyCODONE-acetaminophen (PERCOCET/ROXICET) 5-325 MG per tablet 2 tablet (has no administration in time range)  lactated ringers bolus 1,000 mL (1,000 mLs Intravenous New Bag/Given 08/21/20 4481)  metoCLOPramide (REGLAN) injection 10 mg (10 mg Intravenous Given 08/21/20 0608)  HYDROmorphone (DILAUDID) injection 1 mg (1 mg Intravenous Given 08/21/20 8563)    ED Course  I have reviewed the triage vital signs and the nursing notes.  Pertinent labs & imaging results that were available during my care of the patient were reviewed by me and considered in my medical decision making (see chart for details).    MDM Rules/Calculators/A&P                          Will work on pain control and nausea control.  Hopefully can be discharged if able to start tolerating  p.o.  Patient with improving nausea.  Pain somewhat improved but not significantly so.  We will go ahead and do an oral challenge with a couple Percocet.  We will see if his pains better.  If not then may need further nausea and/or pain control with possible admission.  Care transferred pending same.   Final Clinical Impression(s) / ED Diagnoses Final diagnoses:  None    Rx / DC Orders ED Discharge Orders    None       Panzy Bubeck, Corene Cornea, MD 08/21/20 [phone]

## 2020-08-21 NOTE — Discharge Instructions (Addendum)
1.  Call your surgical team at [facility] tomorrow to let them know you are seen in the emergency department for pain control.  Schedule follow-up with them soon as possible. 2.  Very carefully follow the dietary instructions that were given to you after your surgery.  Also included in your discharge instructions is some reinforcement for pancreatic eating plan.

## 2020-08-21 NOTE — ED Triage Notes (Signed)
Patient reports he had an ERCP at [facility] on Friday and is having worsening abdominal pain.

## 2020-08-21 NOTE — Telephone Encounter (Signed)
Pharmacy [facility] called to document etiology of pain patient experiencing for pain medication. Patient documentation reveals pancreatitis excacerbation.

## 2020-08-21 NOTE — ED Provider Notes (Signed)
Patient is reassessed.  He has taken the oral Phenergan.  He has had some crackers and some liquids.  He has not had any vomiting since taking those.  He reports he still has some central abdominal pain and a bit of nausea. Physical Exam  BP 133/84 (BP Location: Right Arm)   Pulse 64   Temp 98 F (36.7 C) (Oral)   Resp 16   Ht 6\' 1"  (1.854 m)   Wt 76.2 kg   SpO2 94%   BMI 22.16 kg/m   Physical Exam  ED Course/Procedures     Procedures  MDM  Patient is reassessed.  At this point, he has tolerated oral intake.  We will do 1 more round of IV Dilaudid and Phenergan.  Clinically he is well in appearance.  We discussed a plan of either transferring to [facility] potentially for pain control post pancreatic surgery or return home to continue nausea medications and fluids and calling [facility] for follow-up tomorrow.  Patient reports he feels comfortable taking additional dose of pain medications and using Phenergan at home to control nausea.  He would like to be discharged and do a follow-up call to [facility] tomorrow.       Charlesetta Shanks, MD 08/21/20 [phone]

## 2020-10-29 ENCOUNTER — Encounter (HOSPITAL_COMMUNITY): Payer: Self-pay | Admitting: Emergency Medicine

## 2020-10-29 ENCOUNTER — Other Ambulatory Visit: Payer: Self-pay

## 2020-10-29 ENCOUNTER — Emergency Department (HOSPITAL_COMMUNITY): Payer: Managed Care, Other (non HMO)

## 2020-10-29 ENCOUNTER — Emergency Department (HOSPITAL_COMMUNITY)
Admission: EM | Admit: 2020-10-29 | Discharge: 2020-10-29 | Disposition: A | Discharge status: Home / Self Care | Payer: Managed Care, Other (non HMO) | Source: Home / Self Care | Attending: Emergency Medicine | Admitting: Emergency Medicine

## 2020-10-29 DIAGNOSIS — K859 Acute pancreatitis without necrosis or infection, unspecified: Secondary | ICD-10-CM | POA: Diagnosis not present

## 2020-10-29 DIAGNOSIS — T8389XA Other specified complication of genitourinary prosthetic devices, implants and grafts, initial encounter: Secondary | ICD-10-CM | POA: Diagnosis not present

## 2020-10-29 DIAGNOSIS — K861 Other chronic pancreatitis: Secondary | ICD-10-CM | POA: Insufficient documentation

## 2020-10-29 DIAGNOSIS — K219 Gastro-esophageal reflux disease without esophagitis: Secondary | ICD-10-CM | POA: Insufficient documentation

## 2020-10-29 DIAGNOSIS — I10 Essential (primary) hypertension: Secondary | ICD-10-CM | POA: Insufficient documentation

## 2020-10-29 DIAGNOSIS — Z79899 Other long term (current) drug therapy: Secondary | ICD-10-CM | POA: Insufficient documentation

## 2020-10-29 DIAGNOSIS — R197 Diarrhea, unspecified: Secondary | ICD-10-CM | POA: Insufficient documentation

## 2020-10-29 LAB — COMPREHENSIVE METABOLIC PANEL
ALT: 19 U/L (ref 0–44)
AST: 24 U/L (ref 15–41)
Albumin: 4.1 g/dL (ref 3.5–5.0)
Alkaline Phosphatase: 60 U/L (ref 38–126)
Anion gap: 10 (ref 5–15)
BUN: 10 mg/dL (ref 6–20)
CO2: 21 mmol/L — ABNORMAL LOW (ref 22–32)
Calcium: 9.7 mg/dL (ref 8.9–10.3)
Chloride: 105 mmol/L (ref 98–111)
Creatinine, Ser: 1.42 mg/dL — ABNORMAL HIGH (ref 0.61–1.24)
GFR, Estimated: 60 mL/min — ABNORMAL LOW (ref >60)
Glucose, Bld: 113 mg/dL — ABNORMAL HIGH (ref 70–99)
Potassium: 3.5 mmol/L (ref 3.5–5.1)
Sodium: 136 mmol/L (ref 135–145)
Total Bilirubin: 1.6 mg/dL — ABNORMAL HIGH (ref 0.3–1.2)
Total Protein: 7.7 g/dL (ref 6.5–8.1)

## 2020-10-29 LAB — URINALYSIS, ROUTINE W REFLEX MICROSCOPIC
Bacteria, UA: NONE SEEN
Bilirubin Urine: NEGATIVE
Glucose, UA: NEGATIVE mg/dL
Ketones, ur: 20 mg/dL — AB
Leukocytes,Ua: NEGATIVE
Nitrite: NEGATIVE
Protein, ur: 30 mg/dL — AB
Specific Gravity, Urine: 1.023 (ref 1.005–1.030)
pH: 5 (ref 5.0–8.0)

## 2020-10-29 LAB — CBC
HCT: 41.9 % (ref 39.0–52.0)
Hemoglobin: 14.2 g/dL (ref 13.0–17.0)
MCH: 33.4 pg (ref 26.0–34.0)
MCHC: 33.9 g/dL (ref 30.0–36.0)
MCV: 98.6 fL (ref 80.0–100.0)
Platelets: 221 10*3/uL (ref 150–400)
RBC: 4.25 MIL/uL (ref 4.22–5.81)
RDW: 16.4 % — ABNORMAL HIGH (ref 11.5–15.5)
WBC: 8.6 10*3/uL (ref 4.0–10.5)
nRBC: 0 % (ref 0.0–0.2)

## 2020-10-29 LAB — LIPASE, BLOOD: Lipase: 127 U/L — ABNORMAL HIGH (ref 11–51)

## 2020-10-29 MED ORDER — HYDROMORPHONE HCL 1 MG/ML IJ SOLN
1.0000 mg | Freq: Once | INTRAMUSCULAR | Status: AC
  Administered 2020-10-29: 1 mg via INTRAVENOUS
  Filled 2020-10-29: qty 1

## 2020-10-29 MED ORDER — SODIUM CHLORIDE 0.9 % IV SOLN
25.0000 mg | Freq: Once | INTRAVENOUS | Status: AC
  Administered 2020-10-29: 25 mg via INTRAVENOUS
  Filled 2020-10-29: qty 1

## 2020-10-29 MED ORDER — SODIUM CHLORIDE 0.9 % IV BOLUS
1000.0000 mL | Freq: Once | INTRAVENOUS | Status: AC
  Administered 2020-10-29: 1000 mL via INTRAVENOUS

## 2020-10-29 MED ORDER — OXYCODONE-ACETAMINOPHEN 5-325 MG PO TABS: 1.0000 | ORAL_TABLET | Freq: Four times a day (QID) | ORAL | 0 refills | Status: DC | PRN

## 2020-10-29 NOTE — ED Provider Notes (Signed)
[facility] EMERGENCY DEPARTMENT Provider Note   CSN: [id number] Arrival date & time: 10/29/20  4696     History Chief Complaint  Patient presents with   Abdominal Pain    Kyle Berry is a 50 y.o. male.  He has a history of chronic pancreatitis and has pancreatic stents in place, usually follows at [facility].  He has been having upper abdominal pain radiating to his back associate with nausea vomiting diarrhea and subjective fevers for 2 weeks.  Ran out of his pain medicine.  Saw his GI doctor yesterday but reportedly there were too many people in the emergency department to be referred there at [facility].  Continues with symptoms today.  Concern for stent blockage.  Also states he has had kidney stones and this feels similar to that also.  No urinary symptoms.   The history is provided by the patient.  Abdominal Pain Pain location:  Epigastric Pain quality: aching   Pain radiates to:  Back Pain severity:  Severe Onset quality:  Gradual Duration:  2 weeks Timing:  Constant Progression:  Unchanged Chronicity:  Recurrent Context: not alcohol use and not trauma   Relieved by:  Nothing Worsened by:  Nothing Ineffective treatments:  OTC medications Associated symptoms: diarrhea, fever, nausea and vomiting   Associated symptoms: no chest pain, no cough, no dysuria, no hematemesis, no hematochezia, no hematuria, no shortness of breath and no sore throat       Past Medical History:  Diagnosis Date   Chronic bronchitis (H[facility])    Chronic lower back pain    Congenital single kidney    GERD (gastroesophageal reflux disease)    Headache    "once/month" (05/13/2017)   High cholesterol    Hypertension    Migraine    "a couple/year" (05/13/2017)   Nephrolithiasis 05/13/2017   Pancreatitis    Pneumonia ~ 09/2015   Recurrent acute pancreatitis     Patient Active Problem List   Diagnosis Date Noted   Pancreatitis 04/12/2020   Urinary tract infection without hematuria     Symptomatic anemia 11/05/2019   Acute lower GI bleeding 11/02/2019   Near syncope 11/02/2019   AKI (acute kidney injury) ([place]) 11/02/2019   Colonic ulcer    Rectal bleeding 10/14/2019   Pancreatic insufficiency 05/28/2019   Recurrent acute pancreatitis 10/17/2018   Microscopic hematuria 10/17/2018   Acute pancreatitis 07/23/2018   Pancreatic divisum    Hematemesis 10/18/2017   Recurrent pancreatitis 05/13/2017   Congenital single kidney 05/13/2017   High cholesterol 05/13/2017   Kidney stone 05/13/2017   Acute on chronic pancreatitis ([place]) 01/17/2017   Hypokalemia    Nausea and vomiting    Essential hypertension    Esophageal reflux    Chronic pancreatitis ([place]) 11/23/2015    Past Surgical History:  Procedure Laterality Date   CHOLECYSTECTOMY N/A 10/23/2017   Procedure: LAPAROSCOPIC CHOLECYSTECTOMY WITH INTRAOPERATIVE CHOLANGIOGRAM;  Surgeon: Johnathan Hausen, MD;  Location: [facility] ORS;  Service: General;  Laterality: N/A;   COLONOSCOPY WITH PROPOFOL N/A 11/02/2019   Procedure: COLONOSCOPY WITH PROPOFOL;  Surgeon: Irene Shipper, MD;  Location: [facility] ENDOSCOPY;  Service: Endoscopy;  Laterality: N/A;   ESOPHAGOGASTRODUODENOSCOPY (EGD) WITH PROPOFOL N/A 11/06/2019   Procedure: ESOPHAGOGASTRODUODENOSCOPY (EGD) WITH PROPOFOL;  Surgeon: Irene Shipper, MD;  Location: [facility] ENDOSCOPY;  Service: Endoscopy;  Laterality: N/A;   HEMOSTASIS CLIP PLACEMENT  11/02/2019   Procedure: HEMOSTASIS CLIP PLACEMENT;  Surgeon: Irene Shipper, MD;  Location: [facility] ENDOSCOPY;  Service: Endoscopy;;  NO PAST SURGERIES         Family History  Problem Relation Age of Onset   Hypertension Other    Hypertension Mother    Hypertension Father    Kidney disease Father    Hypertension Sister    Diabetes Sister    Hypertension Brother    Pancreatic cancer Paternal Grandmother    Colon cancer Cousin    Stomach cancer Neg Hx    Esophageal cancer Neg Hx     Social History   Tobacco Use   Smoking status: Never    Smokeless tobacco: Never  Vaping Use   Vaping Use: Never used  Substance Use Topics   Alcohol use: Not Currently    Comment: 05/13/2017 "might have beer a few times/year"   Drug use: No    Home Medications Prior to Admission medications   Medication Sig Start Date End Date Taking? Authorizing Provider  famotidine (PEPCID) 20 MG tablet Take 1 tablet (20 mg total) by mouth 2 (two) times daily. Patient not taking: No sig reported 02/02/20   Cristal Ford, DO  famotidine (PEPCID) 20 MG tablet Take 20 mg by mouth daily as needed for heartburn or indigestion.    [provider]  lipase/protease/amylase (CREON) 36000 UNITS CPEP capsule Take 2 capsules (72,000 Units total) by mouth 3 (three) times daily with meals. 02/02/20   Mikhail, Velta Addison, DO  losartan (COZAAR) 25 MG tablet Take 25 mg by mouth daily. 03/17/20   [provider]  ondansetron (ZOFRAN ODT) 4 MG disintegrating tablet Take 1 tablet (4 mg total) by mouth every 8 (eight) hours as needed for nausea or vomiting. 07/21/20   Fredia Sorrow, MD  oxyCODONE (ROXICODONE) 5 MG immediate release tablet Take 1 tablet (5 mg total) by mouth every 6 (six) hours as needed for moderate pain or severe pain. 07/21/20   Fredia Sorrow, MD  oxyCODONE-acetaminophen (PERCOCET) 5-325 MG tablet Take 1 tablet by mouth every 6 (six) hours as needed. 08/21/20   Charlesetta Shanks, MD  promethazine (PHENERGAN) 25 MG tablet Take 25 mg by mouth every 6 (six) hours as needed for nausea or vomiting.    [provider]    Allergies    Diclofenac, Norvasc [amlodipine besylate], Omeprazole, and Prednisone  Review of Systems   Review of Systems  Constitutional:  Positive for fever.  HENT:  Negative for sore throat.   Eyes:  Negative for visual disturbance.  Respiratory:  Negative for cough and shortness of breath.   Cardiovascular:  Negative for chest pain.  Gastrointestinal:  Positive for abdominal pain, diarrhea, nausea and vomiting.  Negative for hematemesis and hematochezia.  Genitourinary:  Negative for dysuria and hematuria.  Musculoskeletal:  Positive for back pain.  Skin:  Negative for rash.  Neurological:  Negative for headaches.   Physical Exam Updated Vital Signs BP (!) 153/105   Pulse 74   Temp 97.7 F (36.5 C) (Oral)   Resp 17   Ht 6\' 1"  (1.854 m)   Wt 82 kg   SpO2 98%   BMI 23.85 kg/m   Physical Exam Vitals and nursing note reviewed.  Constitutional:      Appearance: Normal appearance. He is well-developed.  HENT:     Head: Normocephalic and atraumatic.  Eyes:     Conjunctiva/sclera: Conjunctivae normal.  Cardiovascular:     Rate and Rhythm: Normal rate and regular rhythm.     Heart sounds: No murmur heard. Pulmonary:     Effort: Pulmonary effort is normal. No  respiratory distress.     Breath sounds: Normal breath sounds.  Abdominal:     Palpations: Abdomen is soft.     Tenderness: There is generalized abdominal tenderness. There is no guarding or rebound.  Musculoskeletal:        General: No deformity or signs of injury. Normal range of motion.     Cervical back: Neck supple.  Skin:    General: Skin is warm and dry.  Neurological:     General: No focal deficit present.     Mental Status: He is alert.    ED Results / Procedures / Treatments   Labs (all labs ordered are listed, but only abnormal results are displayed) Labs Reviewed  LIPASE, BLOOD - Abnormal; Notable for the following components:      Result Value   Lipase 127 (*)    All other components within normal limits  COMPREHENSIVE METABOLIC PANEL - Abnormal; Notable for the following components:   CO2 21 (*)    Glucose, Bld 113 (*)    Creatinine, Ser 1.42 (*)    Total Bilirubin 1.6 (*)    GFR, Estimated 60 (*)    All other components within normal limits  CBC - Abnormal; Notable for the following components:   RDW 16.4 (*)    All other components within normal limits  URINALYSIS, ROUTINE W REFLEX MICROSCOPIC -  Abnormal; Notable for the following components:   APPearance HAZY (*)    Hgb urine dipstick MODERATE (*)    Ketones, ur 20 (*)    Protein, ur 30 (*)    All other components within normal limits    EKG None  Radiology CT Renal Stone Study  Result Date: 10/29/2020 CLINICAL DATA:  Flank pain. Concern for kidney stone. History of pancreatitis. EXAM: CT ABDOMEN AND PELVIS WITHOUT CONTRAST TECHNIQUE: Multidetector CT imaging of the abdomen and pelvis was performed following the standard protocol without IV contrast. COMPARISON:  07/21/2020 FINDINGS: Lower chest: Lung bases are clear. Hepatobiliary: No focal hepatic lesion. Postcholecystectomy. No biliary dilatation. Pancreas: Stent within the pancreatic duct extending to the duodenum. Mild haziness in the peripancreatic fat is similar to comparison exam. There is small amount of fluid along the anterior LEFT peritoneal fascia also similar to comparison exam. (Image 31/4). No organized fluid collections Spleen: Normal spleen Adrenals/urinary tract: Adrenal glands normal. The RIGHT kidney is severely atrophic. Two nonobstructing calculi in lower pole of the LEFT kidney measuring 6 mm and 5 mm. No LEFT ureterolithiasis or obstructive uropathy. No bladder calculi Stomach/Bowel: Stomach, small bowel, appendix, and cecum are normal. The colon and rectosigmoid colon are normal. Vascular/Lymphatic: Abdominal aorta is normal caliber. No periportal or retroperitoneal adenopathy. No pelvic adenopathy. Reproductive: Prostate un remark Other: No free fluid. Musculoskeletal: No aggressive osseous lesion. IMPRESSION: 1. Findings consistent with mild acute pancreatitis however findings are very similar to CT 08/10/2020 therefore would also consider chronic pancreatitis or acute on chronic pancreatitis. 2. New pancreatic duct stent. 3. Two nonobstructing LEFT renal calculi. No ureterolithiasis or obstructive uropathy. 4. Severely atrophic RIGHT kidney. Electronically Signed    By: Suzy Bouchard M.D.   On: 10/29/2020 09:31    Procedures Procedures   Medications Ordered in ED Medications  HYDROmorphone (DILAUDID) injection 1 mg (1 mg Intravenous Given 10/29/20 0824)  sodium chloride 0.9 % bolus 1,000 mL (0 mLs Intravenous Stopped 10/29/20 1019)  promethazine (PHENERGAN) 25 mg in sodium chloride 0.9 % 50 mL IVPB (0 mg Intravenous Stopped 10/29/20 0915)  HYDROmorphone (DILAUDID) injection 1  mg (1 mg Intravenous Given 10/29/20 1019)    ED Course  I have reviewed the triage vital signs and the nursing notes.  Pertinent labs & imaging results that were available during my care of the patient were reviewed by me and considered in my medical decision making (see chart for details).  Clinical Course as of 10/29/20 1730  Sat Oct 29, 2020  0953 Reviewed current work-up with patient.  He said the pain was better but is starting to come back.  He is out of his oral pain medication that usually helps control it.  He felt that if he had another shot and wants to be able to take at home that he could control his symptoms [MB]    Clinical Course User Index [MB] Hayden Rasmussen, MD   MDM Rules/Calculators/A&P                         This patient complains of acute on chronic abdominal pain nausea vomiting; this involves an extensive number of treatment Options and is a complaint that carries with it a high risk of complications and Morbidity. The differential includes pancreatitis, stent blockage, gastritis or peptic ulcer disease, obstruction, renal colic perforation  I ordered, reviewed and interpreted labs, which included CBC with normal white count normal hemoglobin, chemistries fairly normal other than low bicarb slightly elevated creatinine reflecting some dehydration.  LFTs are fairly normal other than T bili elevated but lipase is moderately elevated.  Urinalysis reflecting some signs of dehydration and 11-20 reds. I ordered medication IV fluids nausea medication IV  pain medication I ordered imaging studies which included CT renal and I independently    visualized and interpreted imaging which showed pancreatic inflammatory changes, stent in good position, no evidence of ureterolithiasis  Previous records obtained and reviewed in epic, has had multiple ED presentations for similar and usually is able to go home.   After the interventions stated above, I reevaluated the patient and found patient's pain is improving he is tolerating p.o.  He is comfortable plan for outpatient management of his symptoms.  We will provide with a short prescription of some pain medication as he said he is run out.  Reviewed in PMP   Final Clinical Impression(s) / ED Diagnoses Final diagnoses:  Chronic pancreatitis, unspecified pancreatitis type [facility])    Rx / DC Orders ED Discharge Orders          Ordered    oxyCODONE-acetaminophen (PERCOCET) 5-325 MG tablet  Every 6 hours PRN        10/29/20 1037             Hayden Rasmussen, MD 10/29/20 1733

## 2020-10-29 NOTE — Discharge Instructions (Addendum)
You were seen in the emergency department for a flareup of your pancreatitis.  You had blood work and a CAT scan that showed the pancreas was inflamed.  You received some IV fluids and pain medication with improvement in your symptoms.  We are prescribing you some pain medication.  Please contact your GI doctors at [facility] for close follow-up.  Return to the emergency department if any worsening or concerning symptoms

## 2020-10-29 NOTE — ED Triage Notes (Signed)
Patient reports pain across his abdomen for 2 weeks with emesis and diarrhea , history of pancreatitis , no fever or chills .

## 2020-10-30 ENCOUNTER — Encounter (HOSPITAL_COMMUNITY): Payer: Self-pay | Admitting: Emergency Medicine

## 2020-10-30 ENCOUNTER — Inpatient Hospital Stay (HOSPITAL_COMMUNITY)
Admission: EM | Admit: 2020-10-30 | Discharge: 2020-11-03 | DRG: 698 | Disposition: A | Discharge status: Home / Self Care | Payer: Managed Care, Other (non HMO) | Attending: Internal Medicine | Admitting: Internal Medicine

## 2020-10-30 DIAGNOSIS — R634 Abnormal weight loss: Secondary | ICD-10-CM | POA: Diagnosis present

## 2020-10-30 DIAGNOSIS — K2991 Gastroduodenitis, unspecified, with bleeding: Secondary | ICD-10-CM | POA: Diagnosis present

## 2020-10-30 DIAGNOSIS — Z888 Allergy status to other drugs, medicaments and biological substances status: Secondary | ICD-10-CM

## 2020-10-30 DIAGNOSIS — R52 Pain, unspecified: Secondary | ICD-10-CM

## 2020-10-30 DIAGNOSIS — Q453 Other congenital malformations of pancreas and pancreatic duct: Secondary | ICD-10-CM

## 2020-10-30 DIAGNOSIS — K861 Other chronic pancreatitis: Secondary | ICD-10-CM | POA: Diagnosis present

## 2020-10-30 DIAGNOSIS — Z8249 Family history of ischemic heart disease and other diseases of the circulatory system: Secondary | ICD-10-CM

## 2020-10-30 DIAGNOSIS — K8500 Idiopathic acute pancreatitis without necrosis or infection: Secondary | ICD-10-CM | POA: Diagnosis present

## 2020-10-30 DIAGNOSIS — K317 Polyp of stomach and duodenum: Secondary | ICD-10-CM | POA: Diagnosis present

## 2020-10-30 DIAGNOSIS — N2881 Hypertrophy of kidney: Secondary | ICD-10-CM | POA: Diagnosis present

## 2020-10-30 DIAGNOSIS — D62 Acute posthemorrhagic anemia: Secondary | ICD-10-CM | POA: Diagnosis present

## 2020-10-30 DIAGNOSIS — K219 Gastro-esophageal reflux disease without esophagitis: Secondary | ICD-10-CM | POA: Diagnosis present

## 2020-10-30 DIAGNOSIS — Z79899 Other long term (current) drug therapy: Secondary | ICD-10-CM

## 2020-10-30 DIAGNOSIS — Z87442 Personal history of urinary calculi: Secondary | ICD-10-CM

## 2020-10-30 DIAGNOSIS — Z800 Family history of malignant neoplasm of digestive organs: Secondary | ICD-10-CM

## 2020-10-30 DIAGNOSIS — K2971 Gastritis, unspecified, with bleeding: Secondary | ICD-10-CM | POA: Diagnosis present

## 2020-10-30 DIAGNOSIS — Z9049 Acquired absence of other specified parts of digestive tract: Secondary | ICD-10-CM

## 2020-10-30 DIAGNOSIS — Z6823 Body mass index (BMI) 23.0-23.9, adult: Secondary | ICD-10-CM

## 2020-10-30 DIAGNOSIS — N182 Chronic kidney disease, stage 2 (mild): Secondary | ICD-10-CM | POA: Diagnosis present

## 2020-10-30 DIAGNOSIS — Q600 Renal agenesis, unilateral: Secondary | ICD-10-CM

## 2020-10-30 DIAGNOSIS — I129 Hypertensive chronic kidney disease with stage 1 through stage 4 chronic kidney disease, or unspecified chronic kidney disease: Secondary | ICD-10-CM | POA: Diagnosis present

## 2020-10-30 DIAGNOSIS — Z20822 Contact with and (suspected) exposure to covid-19: Secondary | ICD-10-CM | POA: Diagnosis present

## 2020-10-30 DIAGNOSIS — T8389XA Other specified complication of genitourinary prosthetic devices, implants and grafts, initial encounter: Principal | ICD-10-CM | POA: Diagnosis present

## 2020-10-30 DIAGNOSIS — Z419 Encounter for procedure for purposes other than remedying health state, unspecified: Secondary | ICD-10-CM

## 2020-10-30 DIAGNOSIS — K2970 Gastritis, unspecified, without bleeding: Secondary | ICD-10-CM

## 2020-10-30 DIAGNOSIS — E7800 Pure hypercholesterolemia, unspecified: Secondary | ICD-10-CM | POA: Diagnosis present

## 2020-10-30 DIAGNOSIS — K859 Acute pancreatitis without necrosis or infection, unspecified: Secondary | ICD-10-CM | POA: Diagnosis present

## 2020-10-30 DIAGNOSIS — Z833 Family history of diabetes mellitus: Secondary | ICD-10-CM

## 2020-10-30 DIAGNOSIS — Z4689 Encounter for fitting and adjustment of other specified devices: Secondary | ICD-10-CM

## 2020-10-30 DIAGNOSIS — Z9689 Presence of other specified functional implants: Secondary | ICD-10-CM

## 2020-10-30 DIAGNOSIS — Z841 Family history of disorders of kidney and ureter: Secondary | ICD-10-CM

## 2020-10-30 DIAGNOSIS — Z8711 Personal history of peptic ulcer disease: Secondary | ICD-10-CM

## 2020-10-30 DIAGNOSIS — Y732 Prosthetic and other implants, materials and accessory gastroenterology and urology devices associated with adverse incidents: Secondary | ICD-10-CM | POA: Diagnosis present

## 2020-10-30 LAB — CBC
HCT: 38.7 % — ABNORMAL LOW (ref 39.0–52.0)
Hemoglobin: 13.1 g/dL (ref 13.0–17.0)
MCH: 33.8 pg (ref 26.0–34.0)
MCHC: 33.9 g/dL (ref 30.0–36.0)
MCV: 99.7 fL (ref 80.0–100.0)
Platelets: 219 10*3/uL (ref 150–400)
RBC: 3.88 MIL/uL — ABNORMAL LOW (ref 4.22–5.81)
RDW: 16 % — ABNORMAL HIGH (ref 11.5–15.5)
WBC: 6.1 10*3/uL (ref 4.0–10.5)
nRBC: 0 % (ref 0.0–0.2)

## 2020-10-30 LAB — COMPREHENSIVE METABOLIC PANEL
ALT: 17 U/L (ref 0–44)
AST: 30 U/L (ref 15–41)
Albumin: 3.9 g/dL (ref 3.5–5.0)
Alkaline Phosphatase: 57 U/L (ref 38–126)
Anion gap: 11 (ref 5–15)
BUN: 11 mg/dL (ref 6–20)
CO2: 23 mmol/L (ref 22–32)
Calcium: 9.4 mg/dL (ref 8.9–10.3)
Chloride: 100 mmol/L (ref 98–111)
Creatinine, Ser: 1.43 mg/dL — ABNORMAL HIGH (ref 0.61–1.24)
GFR, Estimated: 59 mL/min — ABNORMAL LOW (ref >60)
Glucose, Bld: 108 mg/dL — ABNORMAL HIGH (ref 70–99)
Potassium: 3.6 mmol/L (ref 3.5–5.1)
Sodium: 134 mmol/L — ABNORMAL LOW (ref 135–145)
Total Bilirubin: 1.2 mg/dL (ref 0.3–1.2)
Total Protein: 7.7 g/dL (ref 6.5–8.1)

## 2020-10-30 LAB — LIPASE, BLOOD: Lipase: 63 U/L — ABNORMAL HIGH (ref 11–51)

## 2020-10-30 NOTE — ED Triage Notes (Signed)
Patient reports persistent pain across his abdomen for 2 weeks with hematemesis and diarrhea , denies fever or chills , seen here yesterday for the same complaints.

## 2020-10-31 DIAGNOSIS — Z79899 Other long term (current) drug therapy: Secondary | ICD-10-CM | POA: Diagnosis not present

## 2020-10-31 DIAGNOSIS — Z800 Family history of malignant neoplasm of digestive organs: Secondary | ICD-10-CM | POA: Diagnosis not present

## 2020-10-31 DIAGNOSIS — K317 Polyp of stomach and duodenum: Secondary | ICD-10-CM | POA: Diagnosis present

## 2020-10-31 DIAGNOSIS — T8389XA Other specified complication of genitourinary prosthetic devices, implants and grafts, initial encounter: Secondary | ICD-10-CM | POA: Diagnosis present

## 2020-10-31 DIAGNOSIS — Z9689 Presence of other specified functional implants: Secondary | ICD-10-CM | POA: Diagnosis not present

## 2020-10-31 DIAGNOSIS — Z833 Family history of diabetes mellitus: Secondary | ICD-10-CM | POA: Diagnosis not present

## 2020-10-31 DIAGNOSIS — Z9049 Acquired absence of other specified parts of digestive tract: Secondary | ICD-10-CM | POA: Diagnosis not present

## 2020-10-31 DIAGNOSIS — Z87442 Personal history of urinary calculi: Secondary | ICD-10-CM | POA: Diagnosis not present

## 2020-10-31 DIAGNOSIS — Q453 Other congenital malformations of pancreas and pancreatic duct: Secondary | ICD-10-CM | POA: Diagnosis not present

## 2020-10-31 DIAGNOSIS — Y732 Prosthetic and other implants, materials and accessory gastroenterology and urology devices associated with adverse incidents: Secondary | ICD-10-CM | POA: Diagnosis present

## 2020-10-31 DIAGNOSIS — K920 Hematemesis: Secondary | ICD-10-CM | POA: Diagnosis not present

## 2020-10-31 DIAGNOSIS — N182 Chronic kidney disease, stage 2 (mild): Secondary | ICD-10-CM | POA: Diagnosis present

## 2020-10-31 DIAGNOSIS — Z8249 Family history of ischemic heart disease and other diseases of the circulatory system: Secondary | ICD-10-CM | POA: Diagnosis not present

## 2020-10-31 DIAGNOSIS — K2970 Gastritis, unspecified, without bleeding: Secondary | ICD-10-CM | POA: Diagnosis not present

## 2020-10-31 DIAGNOSIS — Q600 Renal agenesis, unilateral: Secondary | ICD-10-CM | POA: Diagnosis not present

## 2020-10-31 DIAGNOSIS — I129 Hypertensive chronic kidney disease with stage 1 through stage 4 chronic kidney disease, or unspecified chronic kidney disease: Secondary | ICD-10-CM | POA: Diagnosis present

## 2020-10-31 DIAGNOSIS — Z888 Allergy status to other drugs, medicaments and biological substances status: Secondary | ICD-10-CM | POA: Diagnosis not present

## 2020-10-31 DIAGNOSIS — K3189 Other diseases of stomach and duodenum: Secondary | ICD-10-CM | POA: Diagnosis not present

## 2020-10-31 DIAGNOSIS — E7800 Pure hypercholesterolemia, unspecified: Secondary | ICD-10-CM | POA: Diagnosis present

## 2020-10-31 DIAGNOSIS — Z4659 Encounter for fitting and adjustment of other gastrointestinal appliance and device: Secondary | ICD-10-CM | POA: Diagnosis not present

## 2020-10-31 DIAGNOSIS — K2971 Gastritis, unspecified, with bleeding: Secondary | ICD-10-CM | POA: Diagnosis present

## 2020-10-31 DIAGNOSIS — N2881 Hypertrophy of kidney: Secondary | ICD-10-CM | POA: Diagnosis present

## 2020-10-31 DIAGNOSIS — K219 Gastro-esophageal reflux disease without esophagitis: Secondary | ICD-10-CM | POA: Diagnosis present

## 2020-10-31 DIAGNOSIS — K8500 Idiopathic acute pancreatitis without necrosis or infection: Secondary | ICD-10-CM | POA: Diagnosis present

## 2020-10-31 DIAGNOSIS — R634 Abnormal weight loss: Secondary | ICD-10-CM | POA: Diagnosis present

## 2020-10-31 DIAGNOSIS — Z20822 Contact with and (suspected) exposure to covid-19: Secondary | ICD-10-CM | POA: Diagnosis present

## 2020-10-31 DIAGNOSIS — K861 Other chronic pancreatitis: Secondary | ICD-10-CM | POA: Diagnosis present

## 2020-10-31 DIAGNOSIS — K2991 Gastroduodenitis, unspecified, with bleeding: Secondary | ICD-10-CM | POA: Diagnosis present

## 2020-10-31 DIAGNOSIS — Z841 Family history of disorders of kidney and ureter: Secondary | ICD-10-CM | POA: Diagnosis not present

## 2020-10-31 DIAGNOSIS — D62 Acute posthemorrhagic anemia: Secondary | ICD-10-CM | POA: Diagnosis present

## 2020-10-31 DIAGNOSIS — K2990 Gastroduodenitis, unspecified, without bleeding: Secondary | ICD-10-CM | POA: Diagnosis not present

## 2020-10-31 DIAGNOSIS — K859 Acute pancreatitis without necrosis or infection, unspecified: Secondary | ICD-10-CM | POA: Diagnosis present

## 2020-10-31 LAB — CBC
HCT: 34.2 % — ABNORMAL LOW (ref 39.0–52.0)
Hemoglobin: 11.8 g/dL — ABNORMAL LOW (ref 13.0–17.0)
MCH: 34.4 pg — ABNORMAL HIGH (ref 26.0–34.0)
MCHC: 34.5 g/dL (ref 30.0–36.0)
MCV: 99.7 fL (ref 80.0–100.0)
Platelets: 187 10*3/uL (ref 150–400)
RBC: 3.43 MIL/uL — ABNORMAL LOW (ref 4.22–5.81)
RDW: 15.9 % — ABNORMAL HIGH (ref 11.5–15.5)
WBC: 5.2 10*3/uL (ref 4.0–10.5)
nRBC: 0 % (ref 0.0–0.2)

## 2020-10-31 LAB — COMPREHENSIVE METABOLIC PANEL
ALT: 16 U/L (ref 0–44)
AST: 26 U/L (ref 15–41)
Albumin: 3.1 g/dL — ABNORMAL LOW (ref 3.5–5.0)
Alkaline Phosphatase: 48 U/L (ref 38–126)
Anion gap: 9 (ref 5–15)
BUN: 8 mg/dL (ref 6–20)
CO2: 23 mmol/L (ref 22–32)
Calcium: 8.3 mg/dL — ABNORMAL LOW (ref 8.9–10.3)
Chloride: 104 mmol/L (ref 98–111)
Creatinine, Ser: 1.18 mg/dL (ref 0.61–1.24)
GFR, Estimated: >60 mL/min (ref >60)
Glucose, Bld: 102 mg/dL — ABNORMAL HIGH (ref 70–99)
Potassium: 3.7 mmol/L (ref 3.5–5.1)
Sodium: 136 mmol/L (ref 135–145)
Total Bilirubin: 1.1 mg/dL (ref 0.3–1.2)
Total Protein: 6.3 g/dL — ABNORMAL LOW (ref 6.5–8.1)

## 2020-10-31 LAB — URINALYSIS, ROUTINE W REFLEX MICROSCOPIC
Bilirubin Urine: NEGATIVE
Glucose, UA: NEGATIVE mg/dL
Ketones, ur: 5 mg/dL — AB
Leukocytes,Ua: NEGATIVE
Nitrite: NEGATIVE
Protein, ur: NEGATIVE mg/dL
RBC / HPF: >50 RBC/hpf — ABNORMAL HIGH (ref 0–5)
Specific Gravity, Urine: 1.009 (ref 1.005–1.030)
pH: 5 (ref 5.0–8.0)

## 2020-10-31 LAB — RESP PANEL BY RT-PCR (FLU A&B, COVID) ARPGX2
Influenza A by PCR: NEGATIVE
Influenza B by PCR: NEGATIVE
SARS Coronavirus 2 by RT PCR: NEGATIVE

## 2020-10-31 LAB — MAGNESIUM: Magnesium: 1.8 mg/dL (ref 1.7–2.4)

## 2020-10-31 LAB — PROTIME-INR
INR: 1 (ref 0.8–1.2)
Prothrombin Time: 13.4 seconds (ref 11.4–15.2)

## 2020-10-31 LAB — PHOSPHORUS: Phosphorus: 2.4 mg/dL — ABNORMAL LOW (ref 2.5–4.6)

## 2020-10-31 MED ORDER — SODIUM CHLORIDE 0.9 % IV SOLN
25.0000 mg | Freq: Four times a day (QID) | INTRAVENOUS | Status: DC | PRN
  Administered 2020-10-31 – 2020-11-02 (×8): 25 mg via INTRAVENOUS
  Filled 2020-10-31 (×9): qty 1

## 2020-10-31 MED ORDER — PANTOPRAZOLE 80MG IVPB - SIMPLE MED
80.0000 mg | Freq: Once | INTRAVENOUS | Status: AC
  Administered 2020-10-31: 80 mg via INTRAVENOUS
  Filled 2020-10-31: qty 80

## 2020-10-31 MED ORDER — HYDROMORPHONE HCL 1 MG/ML IJ SOLN
1.0000 mg | INTRAMUSCULAR | Status: DC | PRN
  Administered 2020-10-31 – 2020-11-03 (×12): 1 mg via INTRAVENOUS
  Filled 2020-10-31 (×12): qty 1

## 2020-10-31 MED ORDER — SODIUM CHLORIDE 0.9 % IV BOLUS
1000.0000 mL | Freq: Once | INTRAVENOUS | Status: AC
  Administered 2020-10-31: 1000 mL via INTRAVENOUS

## 2020-10-31 MED ORDER — OXYCODONE HCL 5 MG PO TABS
5.0000 mg | ORAL_TABLET | Freq: Four times a day (QID) | ORAL | Status: DC | PRN
  Administered 2020-10-31: 5 mg via ORAL
  Filled 2020-10-31: qty 1

## 2020-10-31 MED ORDER — LACTATED RINGERS IV SOLN: INTRAVENOUS | Status: AC

## 2020-10-31 MED ORDER — PANTOPRAZOLE INFUSION (NEW) - SIMPLE MED
8.0000 mg/h | INTRAVENOUS | Status: DC
  Administered 2020-10-31: 8 mg/h via INTRAVENOUS
  Filled 2020-10-31: qty 80

## 2020-10-31 MED ORDER — HYDROMORPHONE HCL 1 MG/ML IJ SOLN
1.0000 mg | Freq: Once | INTRAMUSCULAR | Status: AC
  Administered 2020-10-31: 1 mg via INTRAVENOUS
  Filled 2020-10-31: qty 1

## 2020-10-31 MED ORDER — PANTOPRAZOLE SODIUM 40 MG IV SOLR
40.0000 mg | Freq: Two times a day (BID) | INTRAVENOUS | Status: DC
  Administered 2020-10-31 – 2020-11-02 (×4): 40 mg via INTRAVENOUS
  Filled 2020-10-31 (×4): qty 40

## 2020-10-31 MED ORDER — SODIUM CHLORIDE 0.9 % IV SOLN
2.0000 g | Freq: Every day | INTRAVENOUS | Status: DC
  Filled 2020-10-31: qty 20

## 2020-10-31 MED ORDER — MELATONIN 3 MG PO TABS: 3.0000 mg | ORAL_TABLET | Freq: Every evening | ORAL | Status: DC | PRN

## 2020-10-31 MED ORDER — LOSARTAN POTASSIUM 25 MG PO TABS
25.0000 mg | ORAL_TABLET | Freq: Every day | ORAL | Status: DC
  Administered 2020-10-31 – 2020-11-03 (×4): 25 mg via ORAL
  Filled 2020-10-31 (×5): qty 1

## 2020-10-31 MED ORDER — PANCRELIPASE (LIP-PROT-AMYL) 36000-114000 UNITS PO CPEP
72000.0000 [IU] | ORAL_CAPSULE | Freq: Three times a day (TID) | ORAL | Status: DC
  Administered 2020-10-31 – 2020-11-03 (×7): 72000 [IU] via ORAL
  Filled 2020-10-31 (×12): qty 2

## 2020-10-31 MED ORDER — POLYETHYLENE GLYCOL 3350 17 G PO PACK
17.0000 g | PACK | Freq: Every day | ORAL | Status: DC | PRN
  Administered 2020-11-03: 17 g via ORAL
  Filled 2020-10-31: qty 1

## 2020-10-31 MED ORDER — SUCRALFATE 1 GM/10ML PO SUSP
1.0000 g | Freq: Four times a day (QID) | ORAL | Status: AC
  Administered 2020-10-31 – 2020-11-01 (×5): 1 g via ORAL
  Filled 2020-10-31 (×4): qty 10

## 2020-10-31 MED ORDER — ACETAMINOPHEN 325 MG PO TABS: 650.0000 mg | ORAL_TABLET | Freq: Four times a day (QID) | ORAL | Status: DC | PRN

## 2020-10-31 MED ORDER — PANTOPRAZOLE SODIUM 40 MG IV SOLR: 40.0000 mg | Freq: Two times a day (BID) | INTRAVENOUS | Status: DC

## 2020-10-31 MED ORDER — K PHOS MONO-SOD PHOS DI & MONO 155-852-130 MG PO TABS
500.0000 mg | ORAL_TABLET | Freq: Two times a day (BID) | ORAL | Status: AC
  Administered 2020-10-31 (×2): 500 mg via ORAL
  Filled 2020-10-31 (×2): qty 2

## 2020-10-31 MED ORDER — HYDRALAZINE HCL 20 MG/ML IJ SOLN
5.0000 mg | Freq: Four times a day (QID) | INTRAMUSCULAR | Status: DC | PRN
  Filled 2020-10-31: qty 1

## 2020-10-31 NOTE — ED Notes (Signed)
Unsuccessful attempt to call return  Told they did not  Know there was a pt pending

## 2020-10-31 NOTE — H&P (Addendum)
History and Physical  Kyle Berry OEV:[id number] DOB: May 09, 1970 DOA: 10/30/2020  Referring physician: Phoebe Sharps, PA-EDP.  PCP: [name] (Inactive)  Outpatient Specialists: GI Patient coming from: Home.  Chief Complaint: Vomited blood  HPI: Kyle Berry is a 50 y.o. 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  Seen by Dr. Silverio Decamp outpatient.  He presents to [facility] ED due to hematemesis on the day of presentation.  Reports he has been having abdominal pain intermittently since 2016 until he had his pancreatic stent placed at [facility].  After his pancreatic stent placement he went without abdominal pain for about 3 weeks, then his pain restarted.  His abdominal pain has been constant for the past 2 weeks.  Associated with nausea and vomiting every day.  On the day of presentation he vomited frank blood.  1 loose stool.  No NSAIDs use.  No alcohol use.  He endorses epigastric pain radiating to his back.  For the past week he has had a temperature ranging between 99.2 and 99.9.  Admits to chills and night sweats.  Unintentional weight loss for the past 2 months.  The last time he weighed himself at home, he weigh 190 pounds, family and friends have commented that he lost a lot of weight.  Patient presented to the ED for further evaluation and management.  EDP attempted to transfer to [facility] but there were no beds available.  Dr. Benson Norway who is on-call for Dr. Purnell Shoemaker, Rudolpho Sevin, agreed to see the patient in consultation.  [facility], hospitalist team, was asked to admit.    Picture taken by patient at home on 10/30/2020 after episode of hematemesis.  ED Course: Temperature 99.3.  BP 149/95, pulse 69, respiration rate 18, O2 saturation 99% on room air.  Lab studies remarkable for serum sodium 134, potassium 3.6, serum bicarb 23, glucose 108, BUN 11, creatinine 1.43, lipase 63, AST 30, ALT 17, total bilirubin 1.2,  GFR 59.  WBC 6.1, hemoglobin 13.1, MCV 99.7, platelet 219.  Review of Systems: Review of systems as noted in the HPI. All other systems reviewed and are negative.   Past Medical History:  Diagnosis Date   Chronic bronchitis ([place])    Chronic lower back pain    Congenital single kidney    GERD (gastroesophageal reflux disease)    Headache    "once/month" (05/13/2017)   High cholesterol    Hypertension    Migraine    "a couple/year" (05/13/2017)   Nephrolithiasis 05/13/2017   Pancreatitis    Pneumonia ~ 09/2015   Recurrent acute pancreatitis    Past Surgical History:  Procedure Laterality Date   CHOLECYSTECTOMY N/A 10/23/2017   Procedure: LAPAROSCOPIC CHOLECYSTECTOMY WITH INTRAOPERATIVE CHOLANGIOGRAM;  Surgeon: Johnathan Hausen, MD;  Location: [facility];  Service: General;  Laterality: N/A;   COLONOSCOPY WITH PROPOFOL N/A 11/02/2019   Procedure: COLONOSCOPY WITH PROPOFOL;  Surgeon: Irene Shipper, MD;  Location: [facility];  Service: Endoscopy;  Laterality: N/A;   ESOPHAGOGASTRODUODENOSCOPY (EGD) WITH PROPOFOL N/A 11/06/2019   Procedure: ESOPHAGOGASTRODUODENOSCOPY (EGD) WITH PROPOFOL;  Surgeon: Irene Shipper, MD;  Location: [facility] ENDOSCOPY;  Service: Endoscopy;  Laterality: N/A;   HEMOSTASIS CLIP PLACEMENT  11/02/2019   Procedure: HEMOSTASIS CLIP PLACEMENT;  Surgeon: Irene Shipper, MD;  Location: [facility] ENDOSCOPY;  Service: Endoscopy;;   NO PAST SURGERIES      Social History:  reports that he has never smoked. He has never used smokeless tobacco. He reports previous  alcohol use. He reports that he does not use drugs.   Allergies  Allergen Reactions   Diclofenac Hives   Norvasc [Amlodipine Besylate] Other (See Comments)    Fluid buildup in chest   Omeprazole Other (See Comments)    MD stopped due to pancreatitis   Prednisone Other (See Comments)    Mood swings    Family History  Problem Relation Age of Onset   Hypertension Other    Hypertension Mother    Hypertension Father    Kidney  disease Father    Hypertension Sister    Diabetes Sister    Hypertension Brother    Pancreatic cancer Paternal Grandmother    Colon cancer Cousin    Stomach cancer Neg Hx    Esophageal cancer Neg Hx       Prior to Admission medications   Medication Sig Start Date End Date Taking? Authorizing Provider  famotidine (PEPCID) 20 MG tablet Take 1 tablet (20 mg total) by mouth 2 (two) times daily. Patient not taking: No sig reported 02/02/20   Cristal Ford, DO  famotidine (PEPCID) 20 MG tablet Take 20 mg by mouth daily as needed for heartburn or indigestion.    [provider]  lipase/protease/amylase (CREON) 36000 UNITS CPEP capsule Take 2 capsules (72,000 Units total) by mouth 3 (three) times daily with meals. 02/02/20   Mikhail, Velta Addison, DO  losartan (COZAAR) 25 MG tablet Take 25 mg by mouth daily. 03/17/20   [provider]  ondansetron (ZOFRAN ODT) 4 MG disintegrating tablet Take 1 tablet (4 mg total) by mouth every 8 (eight) hours as needed for nausea or vomiting. 07/21/20   Fredia Sorrow, MD  oxyCODONE (ROXICODONE) 5 MG immediate release tablet Take 1 tablet (5 mg total) by mouth every 6 (six) hours as needed for moderate pain or severe pain. 07/21/20   Fredia Sorrow, MD  oxyCODONE-acetaminophen (PERCOCET) 5-325 MG tablet Take 1 tablet by mouth every 6 (six) hours as needed. 10/29/20   Hayden Rasmussen, MD  promethazine (PHENERGAN) 25 MG tablet Take 25 mg by mouth every 6 (six) hours as needed for nausea or vomiting.    [provider]    Physical Exam: BP (!) 144/95   Pulse 78   Temp 98 F (36.7 C)   Resp 18   Ht 6\' 1"  (1.854 m)   Wt 80 kg   SpO2 97%   BMI 23.27 kg/m   General: 50 y.o. year-old male well developed well nourished in no acute distress.  Alert and oriented x3. Cardiovascular: Regular rate and rhythm with no rubs or gallops.  No thyromegaly or JVD noted.  No lower extremity edema. 2/4 pulses in all 4 extremities. Respiratory: Clear  to auscultation with no wheezes or rales. Good inspiratory effort. Abdomen: Soft epigastric tenderness with palpation.  Nondistended with normal bowel sounds x4 quadrants. Muskuloskeletal: No cyanosis, clubbing or edema noted bilaterally Neuro: CN II-XII intact, strength, sensation, reflexes Skin: No ulcerative lesions noted or rashes Psychiatry: Judgement and insight appear normal. Mood is appropriate for condition and setting          Labs on Admission:  Basic Metabolic Panel: Recent Labs  Lab 10/29/20 0351 10/30/20 2220  NA 136 134*  K 3.5 3.6  CL 105 100  CO2 21* 23  GLUCOSE 113* 108*  BUN 10 11  CREATININE 1.42* 1.43*  CALCIUM 9.7 9.4   Liver Function Tests: Recent Labs  Lab 10/29/20 0351 10/30/20 2220  AST 24 30  ALT 19  17  ALKPHOS 60 57  BILITOT 1.6* 1.2  PROT 7.7 7.7  ALBUMIN 4.1 3.9   Recent Labs  Lab 10/29/20 0351 10/30/20 2220  LIPASE 127* 63*   No results for input(s): AMMONIA in the last 168 hours. CBC: Recent Labs  Lab 10/29/20 0351 10/30/20 2220  WBC 8.6 6.1  HGB 14.2 13.1  HCT 41.9 38.7*  MCV 98.6 99.7  PLT 221 219   Cardiac Enzymes: No results for input(s): CKTOTAL, CKMB, CKMBINDEX, TROPONINI in the last 168 hours.  BNP (last 3 results) No results for input(s): BNP in the last 8760 hours.  ProBNP (last 3 results) No results for input(s): PROBNP in the last 8760 hours.  CBG: No results for input(s): GLUCAP in the last 168 hours.  Radiological Exams on Admission: CT Renal Stone Study  Result Date: 10/29/2020 CLINICAL DATA:  Flank pain. Concern for kidney stone. History of pancreatitis. EXAM: CT ABDOMEN AND PELVIS WITHOUT CONTRAST TECHNIQUE: Multidetector CT imaging of the abdomen and pelvis was performed following the standard protocol without IV contrast. COMPARISON:  07/21/2020 FINDINGS: Lower chest: Lung bases are clear. Hepatobiliary: No focal hepatic lesion. Postcholecystectomy. No biliary dilatation. Pancreas: Stent within the  pancreatic duct extending to the duodenum. Mild haziness in the peripancreatic fat is similar to comparison exam. There is small amount of fluid along the anterior LEFT peritoneal fascia also similar to comparison exam. (Image 31/4). No organized fluid collections Spleen: Normal spleen Adrenals/urinary tract: Adrenal glands normal. The RIGHT kidney is severely atrophic. Two nonobstructing calculi in lower pole of the LEFT kidney measuring 6 mm and 5 mm. No LEFT ureterolithiasis or obstructive uropathy. No bladder calculi Stomach/Bowel: Stomach, small bowel, appendix, and cecum are normal. The colon and rectosigmoid colon are normal. Vascular/Lymphatic: Abdominal aorta is normal caliber. No periportal or retroperitoneal adenopathy. No pelvic adenopathy. Reproductive: Prostate un remark Other: No free fluid. Musculoskeletal: No aggressive osseous lesion. IMPRESSION: 1. Findings consistent with mild acute pancreatitis however findings are very similar to CT 08/10/2020 therefore would also consider chronic pancreatitis or acute on chronic pancreatitis. 2. New pancreatic duct stent. 3. Two nonobstructing LEFT renal calculi. No ureterolithiasis or obstructive uropathy. 4. Severely atrophic RIGHT kidney. Electronically Signed   By: Suzy Bouchard M.D.   On: 10/29/2020 09:31    EKG: I independently viewed the EKG done and my findings are as followed: None available at the time of this visit.  Assessment/Plan Present on Admission:  Pancreatitis  Active Problems:   Pancreatitis  Recurrent idiopathic pancreatitis, status post pancreatic stent placement at [facility]. Presented with intractable nausea and vomiting, epigastric pain radiating to his back, evidence of mild acute pancreatitis on CT scan. Presenting lipase 63. GI has been consulted. IV Dilaudid as needed for severe pain, oxycodone as needed for moderate pain, Tylenol as needed for mild pain.  As needed MiraLAX for opioid-induced  constipation.  Hematemesis, likely from retching Self-reported daily nausea and vomiting for the past 2 weeks. No use of NSAIDs, no use of alcohol. Started Protonix drip Added Rocephin Obtain INR N.p.o. until seen by GI Reports from EGD from 11/06/2019, Dr. Henrene Pastor: 1. Mild retching gastropathy 2. Otherwise unremarkable EGD 3. No evidence for clinically significant GI bleeding 4. Posthemorrhagic anemia from recent post polypectomy bleed.  CKD 2 Appears to be close to his baseline creatinine 1.4 with GFR 59. Avoid nephrotoxic agents, dehydration and hypotension. Monitor urine output Repeat renal panel in the morning  Intractable nausea and vomiting Obtain twelve-lead EKG for QTC assessment IV antiemetics  as needed  Essential hypertension BP is not at goal, elevated Resume home losartan IV antihypertensive as needed with parameters Closely monitor vital signs  DVT prophylaxis: SCDs  Code Status: Full code  Family Communication: None at bedside  Disposition Plan: Admit to progressive unit  Consults called: GI consulted  Admission status: Inpatient status.  Patient will require at least 2 midnights for further evaluation and treatment of present condition.   Status is: Inpatient    Dispo: The patient is from: Home.               Anticipated d/c is to: Home.                Patient currently not stable for discharge.    Difficult to place patient, not applicable.        Kayleen Memos MD [facility] Pager [phone]  If 7PM-7AM, please contact night-coverage [email or web address] Password [facility]  10/31/2020, 5:54 AM

## 2020-10-31 NOTE — ED Provider Notes (Signed)
[facility] EMERGENCY DEPARTMENT Provider Note   CSN: [id number] Arrival date & time: 10/30/20  2211     History Chief Complaint  Patient presents with   Pancreatitis/Hematemesis    Kyle Berry is a 50 y.o. male.  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 He reports low grade fever. Today vomiting was blood tinged. No melena. No cough, chest pain, URI symptoms. He reports his last urination was early this morning. Seen 7/15 by his GI at [facility], 7/16 in the ED, with symptoms that have not improved. Unable to tolerate PO solids or liquids.   The history is provided by the patient. No language interpreter was used.      Past Medical History:  Diagnosis Date   Chronic bronchitis (H[facility])    Chronic lower back pain    Congenital single kidney    GERD (gastroesophageal reflux disease)    Headache    "once/month" (05/13/2017)   High cholesterol    Hypertension    Migraine    "a couple/year" (05/13/2017)   Nephrolithiasis 05/13/2017   Pancreatitis    Pneumonia ~ 09/2015   Recurrent acute pancreatitis     Patient Active Problem List   Diagnosis Date Noted   Pancreatitis 04/12/2020   Urinary tract infection without hematuria    Symptomatic anemia 11/05/2019   Acute lower GI bleeding 11/02/2019   Near syncope 11/02/2019   AKI (acute kidney injury) ([place]) 11/02/2019   Colonic ulcer    Rectal bleeding 10/14/2019   Pancreatic insufficiency 05/28/2019   Recurrent acute pancreatitis 10/17/2018   Microscopic hematuria 10/17/2018   Acute pancreatitis 07/23/2018   Pancreatic divisum    Hematemesis 10/18/2017   Recurrent pancreatitis 05/13/2017   Congenital single kidney 05/13/2017   High  cholesterol 05/13/2017   Kidney stone 05/13/2017   Acute on chronic pancreatitis ([place]) 01/17/2017   Hypokalemia    Nausea and vomiting    Essential hypertension    Esophageal reflux    Chronic pancreatitis ([place]) 11/23/2015    Past Surgical History:  Procedure Laterality Date   CHOLECYSTECTOMY N/A 10/23/2017   Procedure: LAPAROSCOPIC CHOLECYSTECTOMY WITH INTRAOPERATIVE CHOLANGIOGRAM;  Surgeon: Johnathan Hausen, MD;  Location: [facility];  Service: General;  Laterality: N/A;   COLONOSCOPY WITH PROPOFOL N/A 11/02/2019   Procedure: COLONOSCOPY WITH PROPOFOL;  Surgeon: Irene Shipper, MD;  Location: [facility] ENDOSCOPY;  Service: Endoscopy;  Laterality: N/A;   ESOPHAGOGASTRODUODENOSCOPY (EGD) WITH PROPOFOL N/A 11/06/2019   Procedure: ESOPHAGOGASTRODUODENOSCOPY (EGD) WITH PROPOFOL;  Surgeon: Irene Shipper, MD;  Location: [facility] ENDOSCOPY;  Service: Endoscopy;  Laterality: N/A;   HEMOSTASIS CLIP PLACEMENT  11/02/2019   Procedure: HEMOSTASIS CLIP PLACEMENT;  Surgeon: Irene Shipper, MD;  Location: [facility] ENDOSCOPY;  Service: Endoscopy;;   NO PAST SURGERIES         Family History  Problem Relation Age of Onset   Hypertension Other    Hypertension Mother    Hypertension Father    Kidney disease Father    Hypertension Sister    Diabetes Sister    Hypertension Brother    Pancreatic cancer Paternal Grandmother    Colon cancer Cousin    Stomach cancer Neg Hx    Esophageal cancer Neg Hx     Social History   Tobacco Use  Smoking status: Never   Smokeless tobacco: Never  Vaping Use   Vaping Use: Never used  Substance Use Topics   Alcohol use: Not Currently    Comment: 05/13/2017 "might have beer a few times/year"   Drug use: No    Home Medications Prior to Admission medications   Medication Sig Start Date End Date Taking? Authorizing Provider  famotidine (PEPCID) 20 MG tablet Take 1 tablet (20 mg total) by mouth 2 (two) times daily. Patient not taking: No sig reported 02/02/20   Cristal Ford, DO   famotidine (PEPCID) 20 MG tablet Take 20 mg by mouth daily as needed for heartburn or indigestion.    [provider]  lipase/protease/amylase (CREON) 36000 UNITS CPEP capsule Take 2 capsules (72,000 Units total) by mouth 3 (three) times daily with meals. 02/02/20   Mikhail, Velta Addison, DO  losartan (COZAAR) 25 MG tablet Take 25 mg by mouth daily. 03/17/20   [provider]  ondansetron (ZOFRAN ODT) 4 MG disintegrating tablet Take 1 tablet (4 mg total) by mouth every 8 (eight) hours as needed for nausea or vomiting. 07/21/20   Fredia Sorrow, MD  oxyCODONE (ROXICODONE) 5 MG immediate release tablet Take 1 tablet (5 mg total) by mouth every 6 (six) hours as needed for moderate pain or severe pain. 07/21/20   Fredia Sorrow, MD  oxyCODONE-acetaminophen (PERCOCET) 5-325 MG tablet Take 1 tablet by mouth every 6 (six) hours as needed. 10/29/20   Hayden Rasmussen, MD  promethazine (PHENERGAN) 25 MG tablet Take 25 mg by mouth every 6 (six) hours as needed for nausea or vomiting.    [provider]    Allergies    Diclofenac, Norvasc [amlodipine besylate], Omeprazole, and Prednisone  Review of Systems   Review of Systems  Constitutional:  Negative for chills and fever.  HENT: Negative.    Respiratory: Negative.    Cardiovascular: Negative.   Gastrointestinal:  Positive for abdominal pain, nausea and vomiting. Negative for blood in stool.  Genitourinary:  Positive for decreased urine volume.  Musculoskeletal: Negative.   Skin: Negative.   Neurological:  Positive for weakness.   Physical Exam Updated Vital Signs BP (!) 142/91   Pulse 85   Temp 99.3 F (37.4 C) (Oral)   Resp 18   Ht 6\' 1"  (1.854 m)   Wt 80 kg   SpO2 99%   BMI 23.27 kg/m   Physical Exam Vitals and nursing note reviewed.  Constitutional:      Appearance: He is well-developed.  HENT:     Head: Normocephalic.     Mouth/Throat:     Mouth: Mucous membranes are dry.  Cardiovascular:     Rate and  Rhythm: Normal rate and regular rhythm.     Heart sounds: No murmur heard. Pulmonary:     Effort: Pulmonary effort is normal.     Breath sounds: Normal breath sounds. No wheezing, rhonchi or rales.  Abdominal:     General: Bowel sounds are decreased.     Palpations: Abdomen is soft.     Tenderness: There is abdominal tenderness. There is no guarding or rebound.    Musculoskeletal:        General: Normal range of motion.     Cervical back: Normal range of motion and neck supple.  Skin:    General: Skin is warm and dry.  Neurological:     General: No focal deficit present.     Mental Status: He is alert and oriented to person, place, and time.  ED Results / Procedures / Treatments   Labs (all labs ordered are listed, but only abnormal results are displayed) Labs Reviewed  LIPASE, BLOOD - Abnormal; Notable for the following components:      Result Value   Lipase 63 (*)    All other components within normal limits  COMPREHENSIVE METABOLIC PANEL - Abnormal; Notable for the following components:   Sodium 134 (*)    Glucose, Bld 108 (*)    Creatinine, Ser 1.43 (*)    GFR, Estimated 59 (*)    All other components within normal limits  CBC - Abnormal; Notable for the following components:   RBC 3.88 (*)    HCT 38.7 (*)    RDW 16.0 (*)    All other components within normal limits  URINALYSIS, ROUTINE W REFLEX MICROSCOPIC   Results for orders placed or performed during the hospital encounter of 10/30/20  Lipase, blood  Result Value Ref Range   Lipase 63 (H) 11 - 51 U/L  Comprehensive metabolic panel  Result Value Ref Range   Sodium 134 (L) 135 - 145 mmol/L   Potassium 3.6 3.5 - 5.1 mmol/L   Chloride 100 98 - 111 mmol/L   CO2 23 22 - 32 mmol/L   Glucose, Bld 108 (H) 70 - 99 mg/dL   BUN 11 6 - 20 mg/dL   Creatinine, Ser 1.43 (H) 0.61 - 1.24 mg/dL   Calcium 9.4 8.9 - 10.3 mg/dL   Total Protein 7.7 6.5 - 8.1 g/dL   Albumin 3.9 3.5 - 5.0 g/dL   AST 30 15 - 41 U/L   ALT 17  0 - 44 U/L   Alkaline Phosphatase 57 38 - 126 U/L   Total Bilirubin 1.2 0.3 - 1.2 mg/dL   GFR, Estimated 59 (L) >60 mL/min   Anion gap 11 5 - 15  CBC  Result Value Ref Range   WBC 6.1 4.0 - 10.5 K/uL   RBC 3.88 (L) 4.22 - 5.81 MIL/uL   Hemoglobin 13.1 13.0 - 17.0 g/dL   HCT 38.7 (L) 39.0 - 52.0 %   MCV 99.7 80.0 - 100.0 fL   MCH 33.8 26.0 - 34.0 pg   MCHC 33.9 30.0 - 36.0 g/dL   RDW 16.0 (H) 11.5 - 15.5 %   Platelets 219 150 - 400 K/uL   nRBC 0.0 0.0 - 0.2 %     EKG None  Radiology CT Renal Stone Study  Result Date: 10/29/2020 CLINICAL DATA:  Flank pain. Concern for kidney stone. History of pancreatitis. EXAM: CT ABDOMEN AND PELVIS WITHOUT CONTRAST TECHNIQUE: Multidetector CT imaging of the abdomen and pelvis was performed following the standard protocol without IV contrast. COMPARISON:  07/21/2020 FINDINGS: Lower chest: Lung bases are clear. Hepatobiliary: No focal hepatic lesion. Postcholecystectomy. No biliary dilatation. Pancreas: Stent within the pancreatic duct extending to the duodenum. Mild haziness in the peripancreatic fat is similar to comparison exam. There is small amount of fluid along the anterior LEFT peritoneal fascia also similar to comparison exam. (Image 31/4). No organized fluid collections Spleen: Normal spleen Adrenals/urinary tract: Adrenal glands normal. The RIGHT kidney is severely atrophic. Two nonobstructing calculi in lower pole of the LEFT kidney measuring 6 mm and 5 mm. No LEFT ureterolithiasis or obstructive uropathy. No bladder calculi Stomach/Bowel: Stomach, small bowel, appendix, and cecum are normal. The colon and rectosigmoid colon are normal. Vascular/Lymphatic: Abdominal aorta is normal caliber. No periportal or retroperitoneal adenopathy. No pelvic adenopathy. Reproductive: Prostate un remark Other: No free fluid.  Musculoskeletal: No aggressive osseous lesion. IMPRESSION: 1. Findings consistent with mild acute pancreatitis however findings are very  similar to CT 08/10/2020 therefore would also consider chronic pancreatitis or acute on chronic pancreatitis. 2. New pancreatic duct stent. 3. Two nonobstructing LEFT renal calculi. No ureterolithiasis or obstructive uropathy. 4. Severely atrophic RIGHT kidney. Electronically Signed   By: Suzy Bouchard M.D.   On: 10/29/2020 09:31    Procedures Procedures   Medications Ordered in ED Medications  sodium chloride 0.9 % bolus 1,000 mL (has no administration in time range)  HYDROmorphone (DILAUDID) injection 1 mg (has no administration in time range)  promethazine (PHENERGAN) 25 mg in sodium chloride 0.9 % 50 mL IVPB (has no administration in time range)    ED Course  I have reviewed the triage vital signs and the nursing notes.  Pertinent labs & imaging results that were available during my care of the patient were reviewed by me and considered in my medical decision making (see chart for details).    MDM Rules/Calculators/A&P                          Patient with h/o chronic pancreatitis, multiple recent medical evaluations with persistent symptoms, unable to tolerate PO solids, or liquids and significant weight loss. Panceratitis felt due to pancreatic divisum. He denies alcohol use.   Patient appear ill but nontoxic. Abdomen tender. Labs c/w recent, no significant change. AKI with elevated renal function in the past associated with acute flares, normal otherwise. Urinary keytones. C/w dehydration.   Patient is unable to have sufficient intake at home, pain uncontrolled, multiple visits in the last 3 days for medical management that have failed to control symptoms, appears dehydrated - will require admission for symptomatic control. He is followed at [facility] predominantly but is also established with Dr. Silverio Decamp in [place].   Discussed transfer/admission with [facility] who reports they are at capacity. Since the patient requires symptom control and hydration, feel admission to [facility] is  reasonable. Hospitalist paged.   Discussed admission with [facility] who advises GI would need to consulted to verify they will participate in the patient's care given his relationship with Dr. Cleda Mccreedy at [facility]. On-call for Dr. Silverio Decamp paged.   Discussed patient with Dr. Benson Norway, GI, who will provide GI care on admission here. Plans to consult in the morning. [facility] to admit.   Final Clinical Impression(s) / ED Diagnoses Final diagnoses:  None   Idiopathic pancreatitis Intractable vomiting Intractable pain Unintentional weight loss  Rx / DC Orders ED Discharge Orders     None        Charlann Lange, PA-C 10/31/20 0449    Orpah Greek, MD 10/31/20 [phone]

## 2020-10-31 NOTE — Progress Notes (Signed)
    This is a no charge note as patient seen and admitted earlier today by Dr. Liberty Handy, chart, imaging and labs were reviewed, patient was seen and examined.  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  As well he did present with hematemesis, secondary to nausea or vomiting, initially on Protonix drip, GI were consulted, regarding further recommendations .  Hemoglobin remained stable, no further hematemesis, Protonix drip changed to PPI twice daily, with Creon, clear liquid diet.  Awake Alert, Oriented X 3, No new F.N deficits, Normal affect Symmetrical Chest wall movement, Good air movement bilaterally, CTAB RRR,No Gallops,Rubs or new Murmurs, No Parasternal Heave +ve B.Sounds, Abd Soft, diffuse upper abd tenderness, No rebound - guarding or rigidity. No Cyanosis, Clubbing or edema, No new Rash or bruise    Phillips Climes MD

## 2020-10-31 NOTE — Consult Note (Signed)
[facility] Consult: 8:15 AM 10/31/2020  LOS: 0 days    Referring Provider: Dr Waldron Labs  Primary Care Physician:  [name] (Inactive) Primary Gastroenterologist:  Dr. Silverio Decamp.  Drs Cephas Darby and Cleda Mccreedy at [place].      Reason for Consultation:  Hematemesis.     HPI: Kyle Berry is a 50 y.o. male. PMH CKD.   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  Also hx and testing as below for hematemesis, pain, anemia.    Lap Cholecystectomy 2019.   IgG 4 normal x 2, no drastic elevation Trigs, no Etoh, in past ? ACE inhibitor, pancreatic divisum anatomy.   10/2016 MRCP showed acute pancreatitis, suspicion for pancreas divisum, no evidence of chronic pancreatitis. 08/2016 MRI/MRCP raised question of chronic pancreatitis with focally dilated portion of dorsal pancreatic duct and fused mild irregularity of pancreatic duct.  05/27/2019 abdominal ultrasound: mild dilation of PD to 4 mm.  No fluid collections or abdominal free fluid.  Mild biliary ductal prominence normal post cholecystectomy.   CTAP 10/14/2019: Mild inflammatory changes at the pancreatic body and tail suggesting acute pancreatitis.  No pseudocyst.  Stable uncinate process calcification.  Severe, stable right kidney atrophy.  Nonobstructing left renal calculi.    Investigation of N/V, abd pain w: 10/14/19 CTAP w contrast: mild inflammation pancreatic bodyand tail.  No pseudocyst. Stable calcification at uncinate.  Lipase 6/30: 197 10/30/19 EGD: normal z line, gastritis (path: mild, chronic gastritis w/o H pylori)  10/30/19 colonoscopy. Fair prep.  20 mm cecal polyp hot snared.  30 mm rectomsigmoid polyp hot snared and site clipped.  Smaller polyps removed as well. Diverticulosis and int rrhoids noted. Path on all polyps:  TA w/o HGD.    Post polypectomy bleed, admitted 7/18 - 11/02/2019 Hgb 12.8 >> 11.5. 11/02/19 colonoscopy: ulcer and pigmented protuberance at cecum, site clipped x 4. Large polypectomy with pigemented protuberance at sigmoid also clipped x 3. Diverticulosis.  11/06/19 EGD:  for Hematemesis, hypotension, Hgb 8.4 (1 PRBC). Mild gastropathy.   01/2020 MRI abdomen/MRCP: Acute pancreatitis with stranding at the tail of the pancreas tracking to left paracolic gutter below the spleen.  Pancreas disease him.  Mild CBD dilatation measuring up to 9 mm which tapers distally in conical fashion.  Some of this dilatation may be physiologic postcholecystectomy response.  No choledocholithiasis.  Several tiny hepatic cysts.  Compensatory left kidney hypertrophy.  Some motion artifact reducing exam sensitivity and specificity 05/31/2019 MRCP: stable, peripancreatic edema, mild c/w with acute on chronic pancreatitis. Pancreas divisum, without duct dilatation.  R renal atrophy.   Home meds include Pepcid, creon.   06/2020 gastric emptying study: Solid gastric emptying at 59 minutes is 19% (normal range at 60 minutes is>10%).  Solid gastric emptying at 121 minutes is 38% (normal range at 120 minutes is >40%).  Solid gastric emptying at 242 minutes is 63% (normal range at 240 minutes is >90%). 08/19/20 ERCP w stent to PD.  Dr Cephas Darby at [facility].  S/p sphincterotomy (to cover in case of SOD).  Upper 3rd of bile  duct dilated.    "A long 0.035 inch Soft Jagwire was passed into the  biliary tree. The short-nosed traction sphincterotome  was passed over the guidewire and the bile duct was  then deeply cannulated. Contrast was injected. I  personally interpreted the bile duct and pancreatic  duct images. Ductal flow of contrast was adequate.  Image quality was adequate. Contrast extended to the  hepatic ducts. The upper third of the main bile duct  was mildly dilated, acquired. The largest diameter was  10 mm. Given unclear etiology  for the patients  pancreatitis outside of Ansa pancreatica the decision  was made to perform a sphincterotomy for possible  contribution of SOD. A 5 mm biliary sphincterotomy was  made with a monofilament traction (standard)  sphincterotome using ERBE electrocautery. There was no  post-sphincterotomy bleeding. The ventral pancreatic duct was deeply cannulated with  the short-nosed traction sphincterotome. Contrast was  injected. Contrast extended to the pancreatic duct. An  Ansa loop was seen within the pancreas head. THe PD  measrued 4 mm within the body. There was mild  narrowing within the ansa loop however no clear  stricture was seen. An 0.018 inch straight standard  wire was passed into the ventral pancreatic duct. A 4  mm ventral pancreatic sphincterotomy was made with a  monofilament traction (standard) sphincterotome using  pure cut current. There was no post-sphincterotomy  bleeding. One 4 Fr by 2 cm plastic stent with two  external flaps and no internal flaps was placed into  the ventral pancreatic duct. Clear fluid flowed  through the stent. The stent was in good position. Given the ansa loop seen and concern for contribution  from possible incomplete divisum the decision was made  to pursue cannulation of the dorsal duct. Deep  cannulation and injection of contrast into the dorsal  pancreatic duct was accomplished with the MiniTome  sphincterotome. A stricture was not seen within the  dorsal pancreatic duct. An 0.018 inch gold tipped wire  was placed into the dorsal pancreatic duct. A 3 mm  dorsal pancreatic sphincterotomy was made with a  monofilament traction (standard) sphincterotome using  pure cut current. There was no post-sphincterotomy  bleeding. One 4 Fr by 5 cm plastic stent with two  external flaps and no internal flaps was placed into  the dorsal pancreatic duct. Clear fluid flowed through  the stent. The stent was in good position."  For ~ 3 weeks post  ERCP, had no active GI sxs.  However after that, recurrent episodes of recurrent N/V, abd pain across upper abdomen.  Seen in urgent care 7/16 and sent home but returned 7/17 w sxs refractory to percocet, zofran, promethazine, creon.    08/25/20 CTAP w contrast: 1. No etiology for acute abdominal pain identified.  2. Persistent mild peripancreatic fat stranding consistent with history of  pancreatitis. No peripancreatic fluid collections. One pancreatic duct  stent remains in place.  GI ROV 7/15 w ongoing sxs.  Dr Cleda Mccreedy suggested pt go to ED and Pt elected to return to [facility] and present to [facility] ED.    Hgb 14.2 >> 13.1.  Baseline of 13.5.   Creat elevated but BUN normal.  Na 134.   Lipase 127 >> 63.  LFTs normal.   10/30/20 CT, renal stone study: Mild acute pancreatitis, findings very similar to CT 08/10/2020.  Consider chronic pancreatitis or acute on chronic pancreatitis.   New pancreatic duct stent.  2 nonobstructing LEFT renal calculi. No ureterolithiasis or obstructive  uropathy.   Severely atrophic RIGHT kidney.    Past Medical History:  Diagnosis Date   Chronic bronchitis ([place])    Chronic lower back pain    Congenital single kidney    GERD (gastroesophageal reflux disease)    Headache    "once/month" (05/13/2017)   High cholesterol    Hypertension    Migraine    "a couple/year" (05/13/2017)   Nephrolithiasis 05/13/2017   Pancreatitis    Pneumonia ~ 09/2015   Recurrent acute pancreatitis     Past Surgical History:  Procedure Laterality Date   CHOLECYSTECTOMY N/A 10/23/2017   Procedure: LAPAROSCOPIC CHOLECYSTECTOMY WITH INTRAOPERATIVE CHOLANGIOGRAM;  Surgeon: Johnathan Hausen, MD;  Location: [facility];  Service: General;  Laterality: N/A;   COLONOSCOPY WITH PROPOFOL N/A 11/02/2019   Procedure: COLONOSCOPY WITH PROPOFOL;  Surgeon: Irene Shipper, MD;  Location: [place];  Service: Endoscopy;  Laterality: N/A;   ESOPHAGOGASTRODUODENOSCOPY (EGD) WITH PROPOFOL N/A 11/06/2019   Procedure:  ESOPHAGOGASTRODUODENOSCOPY (EGD) WITH PROPOFOL;  Surgeon: Irene Shipper, MD;  Location: [facility] END[facility]COPY;  Service: Endoscopy;  Laterality: N/A;   HEMOSTASIS CLIP PLACEMENT  11/02/2019   Procedure: HEMOSTASIS CLIP PLACEMENT;  Surgeon: Irene Shipper, MD;  Location: [facility] ENDOSCOPY;  Service: Endoscopy;;   NO PAST SURGERIES      Prior to Admission medications   Medication Sig Start Date End Date Taking? Authorizing Provider  famotidine (PEPCID) 20 MG tablet Take 1 tablet (20 mg total) by mouth 2 (two) times daily. Patient not taking: No sig reported 02/02/20   Cristal Ford, DO  famotidine (PEPCID) 20 MG tablet Take 20 mg by mouth daily as needed for heartburn or indigestion.    [provider]  lipase/protease/amylase (CREON) 36000 UNITS CPEP capsule Take 2 capsules (72,000 Units total) by mouth 3 (three) times daily with meals. 02/02/20   Mikhail, Velta Addison, DO  losartan (COZAAR) 25 MG tablet Take 25 mg by mouth daily. 03/17/20   [provider]  ondansetron (ZOFRAN ODT) 4 MG disintegrating tablet Take 1 tablet (4 mg total) by mouth every 8 (eight) hours as needed for nausea or vomiting. 07/21/20   Fredia Sorrow, MD  oxyCODONE (ROXICODONE) 5 MG immediate release tablet Take 1 tablet (5 mg total) by mouth every 6 (six) hours as needed for moderate pain or severe pain. 07/21/20   Fredia Sorrow, MD  oxyCODONE-acetaminophen (PERCOCET) 5-325 MG tablet Take 1 tablet by mouth every 6 (six) hours as needed. 10/29/20   Hayden Rasmussen, MD  promethazine (PHENERGAN) 25 MG tablet Take 25 mg by mouth every 6 (six) hours as needed for nausea or vomiting.    [provider]    Scheduled Meds:  lipase/protease/amylase  72,000 Units Oral TID WC   losartan  25 mg Oral Daily   [START ON 11/03/2020] pantoprazole  40 mg Intravenous Q12H   Infusions:  cefTRIAXone (ROCEPHIN)  IV     lactated ringers 50 mL/hr at 10/31/20 0749   pantoprazole 80 mg (10/31/20 0807)   pantoprazole      promethazine (PHENERGAN) injection (IM or IVPB) Stopped (10/31/20 0259)   PRN Meds: acetaminophen, hydrALAZINE, HYDROmorphone (DILAUDID) injection, melatonin, oxyCODONE, polyethylene glycol, promethazine (PHENERGAN) injection (IM or IVPB)   Allergies as of 10/30/2020 - Review Complete 08/21/2020  Allergen Reaction Noted   Diclofenac Hives 12/14/2013   Norvasc [amlodipine besylate] Other (See Comments) 08/15/2016   Omeprazole Other (See Comments) 08/15/2016   Prednisone Other (See Comments) 12/14/2013    Family History  Problem Relation Age of Onset  Hypertension Other    Hypertension Mother    Hypertension Father    Kidney disease Father    Hypertension Sister    Diabetes Sister    Hypertension Brother    Pancreatic cancer Paternal Grandmother    Colon cancer Cousin    Stomach cancer Neg Hx    Esophageal cancer Neg Hx     Social History   Socioeconomic History   Marital status: Single    Spouse name: Not on file   Number of children: Not on file   Years of education: Not on file   Highest education level: Not on file  Occupational History   Occupation: Engineer, building services  Tobacco Use   Smoking status: Never   Smokeless tobacco: Never  Vaping Use   Vaping Use: Never used  Substance and Sexual Activity   Alcohol use: Not Currently    Comment: 05/13/2017 "might have beer a few times/year"   Drug use: No   Sexual activity: Yes    Partners: Female    Birth control/protection: Condom  Other Topics Concern   Not on file  Social History Narrative   Not on file   Social Determinants of Health   Financial Resource Strain: Not on file  Food Insecurity: Not on file  Transportation Needs: Not on file  Physical Activity: Not on file  Stress: Not on file  Social Connections: Not on file  Intimate Partner Violence: Not on file    REVIEW OF SYSTEMS: Constitutional:  weakness, fatigue ENT:  No nose bleeds Pulm:  periodic SOB and sense of pressure in chest, not  activity related CV:  No palpitations, no LE edema.  GU:  No hematuria, no frequency GI:  see HPI Heme:  no unusual or excessive bleeding   Transfusions:  None Neuro:  No headaches, no peripheral tingling or numbness Derm:  No itching, no rash or sores.  Endocrine:  No sweats or chills.  No polyuria or dysuria Immunization:  reviewed.   Travel:  None beyond local counties in last few months.    PHYSICAL EXAM: Vital signs in last 24 hours: Vitals:   10/31/20 0500 10/31/20 0632  BP:  (!) 154/89  Pulse:    Resp:    Temp: 98 F (36.7 C) 98.1 F (36.7 C)  SpO2:     Wt Readings from Last 3 Encounters:  10/31/20 72.8 kg  10/29/20 82 kg  08/21/20 76.2 kg    General: pleasant, not ill looking.  Comfortable.  alert Head:  no signs of trauma, no swelling, no asymmetry  Eyes:  no icterus or conj pallor Ears:  not HOH  Nose:  no discharge or congestion Mouth:  moist, pink, clear.  Halitosis. Tongue midline  Neck:  no JVD, masses or thyromegaly Lungs:  clear w/O labored breathing Heart: RRR.  No MRG Abdomen:  soft, diffuse upper abd tenderness w/O guarding or rebound.  BS active.  Not distended. .   Rectal: deferred   Musc/Skeltl: no gross deformities or swelling Extremities:  no CCE  Neurologic:  oriented x 3.  Moves all 4 limbs.  No tremor or weakness Skin:  no rash, no sores, no jaundice.   Nodes:  no cervical adenopathy   Psych:  pleasant, calm.  Fluid speech.  Good historian.    Intake/Output from previous day: No intake/output data recorded. Intake/Output this shift: No intake/output data recorded.  LAB RESULTS: Recent Labs    10/29/20 0351 10/30/20 2220  WBC 8.6 6.1  HGB 14.2 13.1  HCT 41.9 38.7*  PLT 221 219   BMET Lab Results  Component Value Date   NA 134 (L) 10/30/2020   NA 136 10/29/2020   NA 137 08/21/2020   K 3.6 10/30/2020   K 3.5 10/29/2020   K 3.2 (L) 08/21/2020   CL 100 10/30/2020   CL 105 10/29/2020   CL 101 08/21/2020   CO2 23  10/30/2020   CO2 21 (L) 10/29/2020   CO2 26 08/21/2020   GLUCOSE 108 (H) 10/30/2020   GLUCOSE 113 (H) 10/29/2020   GLUCOSE 129 (H) 08/21/2020   BUN 11 10/30/2020   BUN 10 10/29/2020   BUN 8 08/21/2020   CREATININE 1.43 (H) 10/30/2020   CREATININE 1.42 (H) 10/29/2020   CREATININE 1.31 (H) 08/21/2020   CALCIUM 9.4 10/30/2020   CALCIUM 9.7 10/29/2020   CALCIUM 8.8 (L) 08/21/2020   LFT Recent Labs    10/29/20 0351 10/30/20 2220  PROT 7.7 7.7  ALBUMIN 4.1 3.9  AST 24 30  ALT 19 17  ALKPHOS 60 57  BILITOT 1.6* 1.2   PT/INR Lab Results  Component Value Date   INR 1.1 11/02/2019   INR 1.11 10/19/2017   Hepatitis Panel No results for input(s): HEPBSAG, HCVAB, HEPAIGM, HEPBIGM in the last 72 hours. C-Diff No components found for: CDIFF Lipase     Component Value Date/Time   LIPASE 63 (H) 10/30/2020 2220    Drugs of Abuse     Component Value Date/Time   LABOPIA POSITIVE (A) 10/12/2017 0300   COCAINSCRNUR NONE DETECTED 10/12/2017 0300   LABBENZ NONE DETECTED 10/12/2017 0300   AMPHETMU NONE DETECTED 10/12/2017 0300   THCU NONE DETECTED 10/12/2017 0300   LABBARB (A) 10/12/2017 0300    Result not available. Reagent lot number recalled by manufacturer.     RADIOLOGY STUDIES: CT Renal Stone Study  Result Date: 10/29/2020 CLINICAL DATA:  Flank pain. Concern for kidney stone. History of pancreatitis. EXAM: CT ABDOMEN AND PELVIS WITHOUT CONTRAST TECHNIQUE: Multidetector CT imaging of the abdomen and pelvis was performed following the standard protocol without IV contrast. COMPARISON:  07/21/2020 FINDINGS: Lower chest: Lung bases are clear. Hepatobiliary: No focal hepatic lesion. Postcholecystectomy. No biliary dilatation. Pancreas: Stent within the pancreatic duct extending to the duodenum. Mild haziness in the peripancreatic fat is similar to comparison exam. There is small amount of fluid along the anterior LEFT peritoneal fascia also similar to comparison exam. (Image 31/4).  No organized fluid collections Spleen: Normal spleen Adrenals/urinary tract: Adrenal glands normal. The RIGHT kidney is severely atrophic. Two nonobstructing calculi in lower pole of the LEFT kidney measuring 6 mm and 5 mm. No LEFT ureterolithiasis or obstructive uropathy. No bladder calculi Stomach/Bowel: Stomach, small bowel, appendix, and cecum are normal. The colon and rectosigmoid colon are normal. Vascular/Lymphatic: Abdominal aorta is normal caliber. No periportal or retroperitoneal adenopathy. No pelvic adenopathy. Reproductive: Prostate un remark Other: No free fluid. Musculoskeletal: No aggressive osseous lesion. IMPRESSION: 1. Findings consistent with mild acute pancreatitis however findings are very similar to CT 08/10/2020 therefore would also consider chronic pancreatitis or acute on chronic pancreatitis. 2. New pancreatic duct stent. 3. Two nonobstructing LEFT renal calculi. No ureterolithiasis or obstructive uropathy. 4. Severely atrophic RIGHT kidney. Electronically Signed   By: Suzy Bouchard M.D.   On: 10/29/2020 09:31      IMPRESSION:     Recurrent, acute on chronic pancreatitis.  Extensive work up in past.  Pancreas divisum anatomy.  08/19/20 ERCP w biliary duct sphincterotomy, pancreatic duct sphincterotomy,  and PD stent placement by Dr Cephas Darby at [facility].  Plan is/was for stent removal in 6 weeks.  Stent appears in good position by non-contrast CT.  Unfortunately pt's chronic and acute sxs persist.      Hematemesis.  Hx of same.  Latest EGD of 10/2019: Gastritis.  Gastroparesis by GES of 06/2020.  Suspect esophagitis due to repeated retching/emesis.  Not clear EGD needed.      Congenital solitary kidney, atrophic remaining kidney, CKD, non-obstructing kidney stones.      PLAN:        Add clears, pt feels ready to try these.      W limited hematemesis, will stop PPI drip and begin IV bid.  PPI discontinued in past due to ? If it was cause of pancreatitis, however this is not likely  cause of initial and recurrent pancreatitis so safe to use.  Will add Carafate.  Stopped Rocephin (indication"intra abdominal infection").  Continue Creon.      Does he need an MRCP?  Does ne need tele?    Azucena Freed  10/31/2020, 8:15 AM Phone [phone]

## 2020-11-01 ENCOUNTER — Encounter (HOSPITAL_COMMUNITY): Payer: Self-pay | Admitting: Internal Medicine

## 2020-11-01 ENCOUNTER — Other Ambulatory Visit: Payer: Self-pay

## 2020-11-01 DIAGNOSIS — K861 Other chronic pancreatitis: Secondary | ICD-10-CM | POA: Diagnosis not present

## 2020-11-01 DIAGNOSIS — Z9689 Presence of other specified functional implants: Secondary | ICD-10-CM | POA: Diagnosis not present

## 2020-11-01 LAB — COMPREHENSIVE METABOLIC PANEL
ALT: 17 U/L (ref 0–44)
AST: 25 U/L (ref 15–41)
Albumin: 3 g/dL — ABNORMAL LOW (ref 3.5–5.0)
Alkaline Phosphatase: 48 U/L (ref 38–126)
Anion gap: 9 (ref 5–15)
BUN: <5 mg/dL — ABNORMAL LOW (ref 6–20)
CO2: 26 mmol/L (ref 22–32)
Calcium: 8.8 mg/dL — ABNORMAL LOW (ref 8.9–10.3)
Chloride: 103 mmol/L (ref 98–111)
Creatinine, Ser: 1.05 mg/dL (ref 0.61–1.24)
GFR, Estimated: >60 mL/min (ref >60)
Glucose, Bld: 106 mg/dL — ABNORMAL HIGH (ref 70–99)
Potassium: 3.5 mmol/L (ref 3.5–5.1)
Sodium: 138 mmol/L (ref 135–145)
Total Bilirubin: 0.6 mg/dL (ref 0.3–1.2)
Total Protein: 5.9 g/dL — ABNORMAL LOW (ref 6.5–8.1)

## 2020-11-01 LAB — CBC
HCT: 34.9 % — ABNORMAL LOW (ref 39.0–52.0)
Hemoglobin: 11.8 g/dL — ABNORMAL LOW (ref 13.0–17.0)
MCH: 33.6 pg (ref 26.0–34.0)
MCHC: 33.8 g/dL (ref 30.0–36.0)
MCV: 99.4 fL (ref 80.0–100.0)
Platelets: 179 10*3/uL (ref 150–400)
RBC: 3.51 MIL/uL — ABNORMAL LOW (ref 4.22–5.81)
RDW: 15.8 % — ABNORMAL HIGH (ref 11.5–15.5)
WBC: 5 10*3/uL (ref 4.0–10.5)
nRBC: 0 % (ref 0.0–0.2)

## 2020-11-01 MED ORDER — BOOST / RESOURCE BREEZE PO LIQD CUSTOM
1.0000 | Freq: Three times a day (TID) | ORAL | Status: DC
  Administered 2020-11-01: 1 via ORAL

## 2020-11-01 NOTE — Progress Notes (Signed)
PROGRESS NOTE    Kyle Berry  VQX:[id number] DOB: 1970/09/18 DOA: 10/30/2020 PCP: [name] (Inactive)    Chief Complaint  Patient presents with   Pancreatitis/Hematemesis    Brief Narrative:   Kyle Berry is a 50 y.o. 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  Patient presents with episode of coffee-ground emesis, Suspicious for recurrent pancreatitis, hemiglobin overall remained stable, suspicion for Mallory-Weiss tear, plan for endoscopy tomorrow for stent removal and elevation for source of hematemesis.   Assessment & Plan:   Active Problems:   Pancreatitis   Presence of pancreatic duct stent  Recurrent acute on chronic pancreatitis -Extensive work-up in the past, known history of pancreas divisum anatomy, status post ERCP with biliary duct sphincterotomy, pancreatic duct placement at [facility]. -Clear liquid diet, IV fluids, and pain medications. -Management per GI, plan for EGD tomorrow with stent removal -Continue with Creon  Hematemesis -Overall hemoglobin remained stable, no indication for transfusion, possible Mallory-Weiss tear versus esophagitis, on IV Protonix, plan for endoscopy in a.m.  Congenital solitary kidney CKD stage II -Renal function at baseline, continue to monitor closely  Hypertension -Continue with home losartan, continue with as needed amlodipine as well    DVT prophylaxis: SCD , he was encouraged to ambulate Code Status: Full Family Communication: Patient is coherent and appropriate, all his questions were answered, none at bedside Disposition:   Status is: Inpatient  Remains inpatient appropriate because:IV treatments appropriate due to intensity of illness or inability to take PO  Dispo: The patient is from: Home              Anticipated d/c is to: Home              Patient currently is medically stable to d/c.  Ending endoscopy  tomorrow   Difficult to place patient No       Consultants:  Gastroenterology Subjective:  Coffee-ground emesis, no rectal bleeding, tolerating clear liquid diet, no vomiting  Objective: Vitals:   10/31/20 2353 11/01/20 0359 11/01/20 0718 11/01/20 1151  BP: (!) 152/99 (!) 165/98 (!) 168/90 (!) 141/85  Pulse: 68 61 80 76  Resp: 18 18 15 20   Temp: 97.8 F (36.6 C) 97.6 F (36.4 C) 98.3 F (36.8 C)   TempSrc: Oral Oral Oral   SpO2: 99%  98% 98%  Weight:      Height:        Intake/Output Summary (Last 24 hours) at 11/01/2020 1224 Last data filed at 11/01/2020 1000 Gross per 24 hour  Intake 1589 ml  Output 1500 ml  Net 89 ml   Filed Weights   10/30/20 2220 10/31/20 0632  Weight: 80 kg 72.8 kg    Examination:  General exam: Appears calm and comfortable  Respiratory system: Clear to auscultation. Respiratory effort normal. Cardiovascular system: S1 & S2 heard, RRR. No JVD, murmurs, rubs, gallops or clicks. No pedal edema. Gastrointestinal system: Abdomen is nondistended, minimal epigastric tenderness. No organomegaly or masses felt. Normal bowel sounds heard. Central nervous system: Alert and oriented. No focal neurological deficits. Extremities: Symmetric 5 x 5 power. Skin: No rashes, lesions or ulcers Psychiatry: Judgement and insight appear normal. Mood & affect appropriate.     Data Reviewed: I have personally reviewed following labs and imaging studies  CBC: Recent Labs  Lab 10/29/20 0351 10/30/20 2220 10/31/20 0719 11/01/20 0319  WBC 8.6 6.1 5.2 5.0  HGB 14.2 13.1 11.8* 11.8*  HCT 41.9 38.7*  34.2* 34.9*  MCV 98.6 99.7 99.7 99.4  PLT 221 219 187 620    Basic Metabolic Panel: Recent Labs  Lab 10/29/20 0351 10/30/20 2220 10/31/20 0719 11/01/20 0319  NA 136 134* 136 138  K 3.5 3.6 3.7 3.5  CL 105 100 104 103  CO2 21* 23 23 26   GLUCOSE 113* 108* 102* 106*  BUN 10 11 8  <5*  CREATININE 1.42* 1.43* 1.18 1.05  CALCIUM 9.7 9.4 8.3* 8.8*  MG  --    --  1.8  --   PHOS  --   --  2.4*  --     GFR: Estimated Creatinine Clearance: 85.7 mL/min (by C-G formula based on SCr of 1.05 mg/dL).  Liver Function Tests: Recent Labs  Lab 10/29/20 0351 10/30/20 2220 10/31/20 0719 11/01/20 0319  AST 24 30 26 25   ALT 19 17 16 17   ALKPHOS 60 57 48 48  BILITOT 1.6* 1.2 1.1 0.6  PROT 7.7 7.7 6.3* 5.9*  ALBUMIN 4.1 3.9 3.1* 3.0*    CBG: No results for input(s): GLUCAP in the last 168 hours.   Recent Results (from the past 240 hour(s))  Resp Panel by RT-PCR (Flu A&B, Covid) Nasopharyngeal Swab     Status: None   Collection Time: 10/31/20  2:58 AM   Specimen: Nasopharyngeal Swab; Nasopharyngeal(NP) swabs in vial transport medium  Result Value Ref Range Status   SARS Coronavirus 2 by RT PCR NEGATIVE NEGATIVE Final    Comment: (NOTE) SARS-CoV-2 target nucleic acids are NOT DETECTED.  The SARS-CoV-2 RNA is generally detectable in upper respiratory specimens during the acute phase of infection. The lowest concentration of SARS-CoV-2 viral copies this assay can detect is 138 copies/mL. A negative result does not preclude SARS-Cov-2 infection and should not be used as the sole basis for treatment or other patient management decisions. A negative result may occur with  improper specimen collection/handling, submission of specimen other than nasopharyngeal swab, presence of viral mutation(s) within the areas targeted by this assay, and inadequate number of viral copies(<138 copies/mL). A negative result must be combined with clinical observations, patient history, and epidemiological information. The expected result is Negative.  Fact Sheet for Patients:  EntrepreneurPulse.com.au  Fact Sheet for Healthcare Providers:  IncredibleEmployment.be  This test is no t yet approved or cleared by the [place] FDA and  has been authorized for detection and/or diagnosis of SARS-CoV-2 by FDA under an Emergency  Use Authorization (EUA). This EUA will remain  in effect (meaning this test can be used) for the duration of the COVID-19 declaration under Section 564(b)(1) of the Act, 21 U.S.C.section 360bbb-3(b)(1), unless the authorization is terminated  or revoked sooner.       Influenza A by PCR NEGATIVE NEGATIVE Final   Influenza B by PCR NEGATIVE NEGATIVE Final    Comment: (NOTE) The Xpert Xpress SARS-CoV-2/FLU/RSV plus assay is intended as an aid in the diagnosis of influenza from Nasopharyngeal swab specimens and should not be used as a sole basis for treatment. Nasal washings and aspirates are unacceptable for Xpert Xpress SARS-CoV-2/FLU/RSV testing.  Fact Sheet for Patients: EntrepreneurPulse.com.au  Fact Sheet for Healthcare Providers: IncredibleEmployment.be  This test is not yet approved or cleared by the [place] FDA and has been authorized for detection and/or diagnosis of SARS-CoV-2 by FDA under an Emergency Use Authorization (EUA). This EUA will remain in effect (meaning this test can be used) for the duration of the COVID-19 declaration under Section 564(b)(1) of the Act, 21 U.S.C.  section 360bbb-3(b)(1), unless the authorization is terminated or revoked.  Performed at [facility], [place] [address], [place], [place] [address]   Culture, blood (routine x 2)     Status: None (Preliminary result)   Collection Time: 10/31/20  7:20 AM   Specimen: BLOOD  Result Value Ref Range Status   Specimen Description BLOOD LEFT ANTECUBITAL  Final   Special Requests   Final    BOTTLES DRAWN AEROBIC AND ANAEROBIC Blood Culture adequate volume   Culture   Final    NO GROWTH < 24 HOURS Performed at [facility], [place] [address], [place], [place] [address]    Report Status PENDING  Incomplete  Culture, blood (routine x 2)     Status: None (Preliminary result)   Collection Time: 10/31/20  7:25 AM   Specimen: BLOOD  Result Value Ref Range  Status   Specimen Description BLOOD LEFT ANTECUBITAL  Final   Special Requests   Final    BOTTLES DRAWN AEROBIC AND ANAEROBIC Blood Culture adequate volume   Culture   Final    NO GROWTH < 24 HOURS Performed at [facility], [place] [address] [place], [place] [address]    Report Status PENDING  Incomplete         Radiology Studies: No results found.      Scheduled Meds:  lipase/protease/amylase  72,000 Units Oral TID WC   losartan  25 mg Oral Daily   pantoprazole  40 mg Intravenous Q12H   sucralfate  1 g Oral Q6H   Continuous Infusions:  promethazine (PHENERGAN) injection (IM or IVPB) 25 mg (11/01/20 0820)     LOS: 1 day      Phillips Climes, MD [facility]   To contact the attending provider between 7A-7P or the covering provider during after hours 7P-7A, please log into the web site [email or web address] and access using universal [place] password for that web site. If you do not have the password, please call the hospital operator.  11/01/2020, 12:24 PM

## 2020-11-01 NOTE — Progress Notes (Signed)
      Progress Note   Subjective  Patient states he continues to have some pain. The pain regimen is working for him but when it wears off his pain recurs. He tolerated clears yesterday and wishes to continue that today. No further vomiting. No hematemesis or rectal bleeding.   Objective   Vital signs in last 24 hours: Temp:  [97.6 F (36.4 C)-98.4 F (36.9 C)] 98.3 F (36.8 C) (07/19 0718) Pulse Rate:  [61-80] 80 (07/19 0718) Resp:  [15-19] 15 (07/19 0718) BP: (137-168)/(90-99) 168/90 (07/19 0718) SpO2:  [97 %-100 %] 98 % (07/19 0718)   General:   AA male in NAD, appears comfortable Abdomen:  Soft, epigastric TTP, nondistended.  Extremities:  Without edema. Neurologic:  Alert and oriented,  grossly normal neurologically. Psych:  Cooperative. Normal mood and affect.  Intake/Output from previous day: 07/18 0701 - 07/19 0700 In: 585.3 [I.V.:481.9; IV Piggyback:103.3] Out: 1500 [Urine:1500] Intake/Output this shift: Total I/O In: 783.7 [I.V.:783.7] Out: -   Lab Results: Recent Labs    10/30/20 2220 10/31/20 0719 11/01/20 0319  WBC 6.1 5.2 5.0  HGB 13.1 11.8* 11.8*  HCT 38.7* 34.2* 34.9*  PLT 219 187 179   BMET Recent Labs    10/30/20 2220 10/31/20 0719 11/01/20 0319  NA 134* 136 138  K 3.6 3.7 3.5  CL 100 104 103  CO2 23 23 26   GLUCOSE 108* 102* 106*  BUN 11 8 <5*  CREATININE 1.43* 1.18 1.05  CALCIUM 9.4 8.3* 8.8*   LFT Recent Labs    11/01/20 0319  PROT 5.9*  ALBUMIN 3.0*  AST 25  ALT 17  ALKPHOS 48  BILITOT 0.6   PT/INR Recent Labs    10/31/20 0719  LABPROT 13.4  INR 1.0    Studies/Results: No results found.     Assessment / Plan:    50 y/o male with acute on chronic pancreatitis of unclear etiology. History of pancreatic divisum, s/p ERCP on 08/19/20 at [facility] with sphincterotomy and pancreatic duct stent placement. Now with recurrent pancreatitis, also presented with some scant hematemesis in the setting of wretching / vomiting,  could be Mallory weiss tear, he has not had recurrence of this since admission and Hgb stable.   He is tentatively scheduled for endoscopic procedures tomorrow with Dr. Rush Landmark for removal of pancreatic stent, EGD will also be performed in light of the hematemesis history. I have discussed the procedures with him, risks / benefits of the exams and anesthesia and he wishes to proceed.  He can continue clear liquids for now today, continue IVF and pain regimen which appears to be working for him. Will also continue PPI. NPO after MN, call with questions in the interim.  [place] Cellar, MD [facility]

## 2020-11-01 NOTE — Plan of Care (Signed)

## 2020-11-01 NOTE — H&P (View-Only) (Signed)
      Progress Note   Subjective  Patient states he continues to have some pain. The pain regimen is working for him but when it wears off his pain recurs. He tolerated clears yesterday and wishes to continue that today. No further vomiting. No hematemesis or rectal bleeding.   Objective   Vital signs in last 24 hours: Temp:  [97.6 F (36.4 C)-98.4 F (36.9 C)] 98.3 F (36.8 C) (07/19 0718) Pulse Rate:  [61-80] 80 (07/19 0718) Resp:  [15-19] 15 (07/19 0718) BP: (137-168)/(90-99) 168/90 (07/19 0718) SpO2:  [97 %-100 %] 98 % (07/19 0718)   General:   AA male in NAD, appears comfortable Abdomen:  Soft, epigastric TTP, nondistended.  Extremities:  Without edema. Neurologic:  Alert and oriented,  grossly normal neurologically. Psych:  Cooperative. Normal mood and affect.  Intake/Output from previous day: 07/18 0701 - 07/19 0700 In: 585.3 [I.V.:481.9; IV Piggyback:103.3] Out: 1500 [Urine:1500] Intake/Output this shift: Total I/O In: 783.7 [I.V.:783.7] Out: -   Lab Results: Recent Labs    10/30/20 2220 10/31/20 0719 11/01/20 0319  WBC 6.1 5.2 5.0  HGB 13.1 11.8* 11.8*  HCT 38.7* 34.2* 34.9*  PLT 219 187 179   BMET Recent Labs    10/30/20 2220 10/31/20 0719 11/01/20 0319  NA 134* 136 138  K 3.6 3.7 3.5  CL 100 104 103  CO2 23 23 26   GLUCOSE 108* 102* 106*  BUN 11 8 <5*  CREATININE 1.43* 1.18 1.05  CALCIUM 9.4 8.3* 8.8*   LFT Recent Labs    11/01/20 0319  PROT 5.9*  ALBUMIN 3.0*  AST 25  ALT 17  ALKPHOS 48  BILITOT 0.6   PT/INR Recent Labs    10/31/20 0719  LABPROT 13.4  INR 1.0    Studies/Results: No results found.     Assessment / Plan:    50 y/o male with acute on chronic pancreatitis of unclear etiology. History of pancreatic divisum, s/p ERCP on 08/19/20 at [facility] with sphincterotomy and pancreatic duct stent placement. Now with recurrent pancreatitis, also presented with some scant hematemesis in the setting of wretching / vomiting,  could be Mallory weiss tear, he has not had recurrence of this since admission and Hgb stable.   He is tentatively scheduled for endoscopic procedures tomorrow with Dr. Rush Landmark for removal of pancreatic stent, EGD will also be performed in light of the hematemesis history. I have discussed the procedures with him, risks / benefits of the exams and anesthesia and he wishes to proceed.  He can continue clear liquids for now today, continue IVF and pain regimen which appears to be working for him. Will also continue PPI. NPO after MN, call with questions in the interim.   Cellar, MD [facility]

## 2020-11-02 ENCOUNTER — Encounter (HOSPITAL_COMMUNITY)
Admission: EM | Disposition: A | Discharge status: Home / Self Care | Payer: Self-pay | Source: Home / Self Care | Attending: Internal Medicine

## 2020-11-02 ENCOUNTER — Inpatient Hospital Stay (HOSPITAL_COMMUNITY): Payer: Managed Care, Other (non HMO)

## 2020-11-02 ENCOUNTER — Inpatient Hospital Stay (HOSPITAL_COMMUNITY): Payer: Managed Care, Other (non HMO) | Admitting: Anesthesiology

## 2020-11-02 ENCOUNTER — Encounter (HOSPITAL_COMMUNITY): Payer: Self-pay | Admitting: Internal Medicine

## 2020-11-02 DIAGNOSIS — K3189 Other diseases of stomach and duodenum: Secondary | ICD-10-CM

## 2020-11-02 DIAGNOSIS — K2970 Gastritis, unspecified, without bleeding: Secondary | ICD-10-CM | POA: Diagnosis not present

## 2020-11-02 DIAGNOSIS — K859 Acute pancreatitis without necrosis or infection, unspecified: Secondary | ICD-10-CM

## 2020-11-02 DIAGNOSIS — Z4659 Encounter for fitting and adjustment of other gastrointestinal appliance and device: Secondary | ICD-10-CM | POA: Diagnosis not present

## 2020-11-02 HISTORY — PX: BIOPSY: SHX5522

## 2020-11-02 HISTORY — PX: REMOVAL OF STONES: SHX5545

## 2020-11-02 HISTORY — PX: ERCP: SHX5425

## 2020-11-02 HISTORY — PX: ESOPHAGOGASTRODUODENOSCOPY: SHX5428

## 2020-11-02 HISTORY — PX: STENT REMOVAL: SHX6421

## 2020-11-02 LAB — CBC
HCT: 33 % — ABNORMAL LOW (ref 39.0–52.0)
Hemoglobin: 11 g/dL — ABNORMAL LOW (ref 13.0–17.0)
MCH: 33.6 pg (ref 26.0–34.0)
MCHC: 33.3 g/dL (ref 30.0–36.0)
MCV: 100.9 fL — ABNORMAL HIGH (ref 80.0–100.0)
Platelets: 186 10*3/uL (ref 150–400)
RBC: 3.27 MIL/uL — ABNORMAL LOW (ref 4.22–5.81)
RDW: 15.5 % (ref 11.5–15.5)
WBC: 5.9 10*3/uL (ref 4.0–10.5)
nRBC: 0 % (ref 0.0–0.2)

## 2020-11-02 LAB — COMPREHENSIVE METABOLIC PANEL
ALT: 18 U/L (ref 0–44)
AST: 26 U/L (ref 15–41)
Albumin: 2.9 g/dL — ABNORMAL LOW (ref 3.5–5.0)
Alkaline Phosphatase: 59 U/L (ref 38–126)
Anion gap: 7 (ref 5–15)
BUN: <5 mg/dL — ABNORMAL LOW (ref 6–20)
CO2: 28 mmol/L (ref 22–32)
Calcium: 8.6 mg/dL — ABNORMAL LOW (ref 8.9–10.3)
Chloride: 101 mmol/L (ref 98–111)
Creatinine, Ser: 1.14 mg/dL (ref 0.61–1.24)
GFR, Estimated: >60 mL/min (ref >60)
Glucose, Bld: 104 mg/dL — ABNORMAL HIGH (ref 70–99)
Potassium: 3.2 mmol/L — ABNORMAL LOW (ref 3.5–5.1)
Sodium: 136 mmol/L (ref 135–145)
Total Bilirubin: 0.5 mg/dL (ref 0.3–1.2)
Total Protein: 5.9 g/dL — ABNORMAL LOW (ref 6.5–8.1)

## 2020-11-02 SURGERY — ERCP, WITH INTERVENTION IF INDICATED
Anesthesia: General

## 2020-11-02 MED ORDER — ENSURE ENLIVE PO LIQD
237.0000 mL | Freq: Three times a day (TID) | ORAL | Status: DC
  Administered 2020-11-02 – 2020-11-03 (×3): 237 mL via ORAL

## 2020-11-02 MED ORDER — INDOMETHACIN 50 MG RE SUPP
RECTAL | Status: AC
  Filled 2020-11-02: qty 2

## 2020-11-02 MED ORDER — FENTANYL CITRATE (PF) 250 MCG/5ML IJ SOLN
INTRAMUSCULAR | Status: DC | PRN
  Administered 2020-11-02: 100 ug via INTRAVENOUS

## 2020-11-02 MED ORDER — ROCURONIUM BROMIDE 10 MG/ML (PF) SYRINGE
PREFILLED_SYRINGE | INTRAVENOUS | Status: DC | PRN
  Administered 2020-11-02: 80 mg via INTRAVENOUS

## 2020-11-02 MED ORDER — PHENYLEPHRINE 40 MCG/ML (10ML) SYRINGE FOR IV PUSH (FOR BLOOD PRESSURE SUPPORT)
PREFILLED_SYRINGE | INTRAVENOUS | Status: DC | PRN
  Administered 2020-11-02: 80 ug via INTRAVENOUS
  Administered 2020-11-02: 120 ug via INTRAVENOUS
  Administered 2020-11-02: 80 ug via INTRAVENOUS

## 2020-11-02 MED ORDER — PROPOFOL 10 MG/ML IV BOLUS
INTRAVENOUS | Status: DC | PRN
  Administered 2020-11-02: 200 mg via INTRAVENOUS

## 2020-11-02 MED ORDER — FAMOTIDINE 20 MG PO TABS
20.0000 mg | ORAL_TABLET | Freq: Two times a day (BID) | ORAL | Status: DC
  Administered 2020-11-02 – 2020-11-03 (×3): 20 mg via ORAL
  Filled 2020-11-02 (×3): qty 1

## 2020-11-02 MED ORDER — GLUCAGON HCL RDNA (DIAGNOSTIC) 1 MG IJ SOLR
INTRAMUSCULAR | Status: DC | PRN
  Administered 2020-11-02: .25 mg via INTRAVENOUS

## 2020-11-02 MED ORDER — ONDANSETRON HCL 4 MG/2ML IJ SOLN
INTRAMUSCULAR | Status: DC | PRN
  Administered 2020-11-02: 4 mg via INTRAVENOUS

## 2020-11-02 MED ORDER — LIDOCAINE 2% (20 MG/ML) 5 ML SYRINGE
INTRAMUSCULAR | Status: DC | PRN
  Administered 2020-11-02: 100 mg via INTRAVENOUS

## 2020-11-02 MED ORDER — GLUCAGON HCL RDNA (DIAGNOSTIC) 1 MG IJ SOLR
INTRAMUSCULAR | Status: AC
  Filled 2020-11-02: qty 1

## 2020-11-02 MED ORDER — FENTANYL CITRATE (PF) 100 MCG/2ML IJ SOLN
INTRAMUSCULAR | Status: AC
  Filled 2020-11-02: qty 2

## 2020-11-02 MED ORDER — ADULT MULTIVITAMIN W/MINERALS CH
1.0000 | ORAL_TABLET | Freq: Every day | ORAL | Status: DC
  Administered 2020-11-02 – 2020-11-03 (×2): 1 via ORAL
  Filled 2020-11-02 (×2): qty 1

## 2020-11-02 MED ORDER — DEXAMETHASONE SODIUM PHOSPHATE 10 MG/ML IJ SOLN
INTRAMUSCULAR | Status: DC | PRN
  Administered 2020-11-02: 10 mg via INTRAVENOUS

## 2020-11-02 MED ORDER — POTASSIUM CHLORIDE 10 MEQ/100ML IV SOLN
INTRAVENOUS | Status: AC
  Administered 2020-11-02: 10 meq via INTRAVENOUS
  Filled 2020-11-02: qty 100

## 2020-11-02 MED ORDER — POTASSIUM CHLORIDE 10 MEQ/100ML IV SOLN
10.0000 meq | INTRAVENOUS | Status: AC
  Administered 2020-11-02 (×2): 10 meq via INTRAVENOUS
  Filled 2020-11-02 (×2): qty 100

## 2020-11-02 MED ORDER — CIPROFLOXACIN IN D5W 400 MG/200ML IV SOLN
400.0000 mg | Freq: Once | INTRAVENOUS | Status: AC
  Administered 2020-11-02: 400 mg via INTRAVENOUS
  Filled 2020-11-02: qty 200

## 2020-11-02 MED ORDER — PHENYLEPHRINE HCL-NACL 10-0.9 MG/250ML-% IV SOLN
INTRAVENOUS | Status: DC | PRN
  Administered 2020-11-02: 20 ug/min via INTRAVENOUS

## 2020-11-02 MED ORDER — LACTATED RINGERS IV SOLN: INTRAVENOUS | Status: DC | PRN

## 2020-11-02 MED ORDER — SUGAMMADEX SODIUM 200 MG/2ML IV SOLN
INTRAVENOUS | Status: DC | PRN
  Administered 2020-11-02: 200 mg via INTRAVENOUS

## 2020-11-02 NOTE — Anesthesia Procedure Notes (Addendum)
Procedure Name: Intubation Date/Time: 11/02/2020 12:07 PM Performed by: Thelma Comp, CRNA Pre-anesthesia Checklist: Patient identified, Emergency Drugs available, Suction available and Patient being monitored Patient Re-evaluated:Patient Re-evaluated prior to induction Oxygen Delivery Method: Circle System Utilized Preoxygenation: Pre-oxygenation with 100% oxygen Induction Type: IV induction Ventilation: Mask ventilation without difficulty Laryngoscope Size: Mac and 4 Grade View: Grade I Tube type: Oral Tube size: 7.5 mm Number of attempts: 1 Airway Equipment and Method: Stylet Placement Confirmation: ETT inserted through vocal cords under direct vision, positive ETCO2 and breath sounds checked- equal and bilateral Secured at: 23 cm Tube secured with: Tape Dental Injury: Teeth and Oropharynx as per pre-operative assessment

## 2020-11-02 NOTE — Op Note (Addendum)
[facility] Patient Name: Kyle Berry Procedure Date : 11/02/2020 MRN: [id number] Attending MD: Justice Britain , MD Date of Birth: 01/17/1971 CSN: [id number] Age: 50 Admit Type: Inpatient Procedure:                ERCP Indications:              Stent associated acute pancreatitis, Pancreatic                            stent removal Providers:                Justice Britain, MD, Glori Bickers, RN, Cherylynn Ridges, Technician, Vickii Penna CRNA Referring MD:             Carlota Raspberry. Havery Moros, MD, [facility]                            (Dr. Cleda Mccreedy & Dr. Cephas Darby), [facility] Medicines:                General Anesthesia, Glucagon 0.25 mg IV,                            Indomethacin contraindicated due to allergies so                            not administered Complications:            No immediate complications. Estimated Blood Loss:     Estimated blood loss was minimal. Procedure:                Pre-Anesthesia Assessment:                           - Prior to the procedure, a History and Physical                            was performed, and patient medications and                            allergies were reviewed. The patient's tolerance of                            previous anesthesia was also reviewed. The risks                            and benefits of the procedure and the sedation                            options and risks were discussed with the patient.                            All questions were answered, and informed consent  was obtained. Prior Anticoagulants: The patient has                            taken no previous anticoagulant or antiplatelet                            agents. ASA Grade Assessment: III - A patient with                            severe systemic disease. After reviewing the risks                            and benefits, the patient was deemed in                             satisfactory condition to undergo the procedure.                           After obtaining informed consent, the scope was                            passed under direct vision. Throughout the                            procedure, the patient's blood pressure, pulse, and                            oxygen saturations were monitored continuously. The                            [id number]-H190 ([id number]) Olympus endoscope was introduced                            through the mouth, and used to inject contrast into                            and used to locate the major and minor papilla. The                            [id number]-Q180V ([id number]) Olympus Duodenoscope was                            introduced through the mouth, and used to inject                            contrast into and used to inject contrast into the                            dorsal pancreatic duct. The ERCP was accomplished                            without difficulty. The patient tolerated the  procedure. Scope In: Scope Out: Findings:      A pancreatic stent was visible on the scout film.      The esophagus was successfully intubated under direct vision without       detailed examination of the pharynx, larynx, and associated structures.       A standard esophagogastroduodenoscopy scope was used for the examination       of the upper gastrointestinal tract. The scope was passed under direct       vision through the upper GI tract. No gross lesions were noted in the       entire esophagus. The Z-line was irregular and was found 43 cm from the       incisors. Patchy mild inflammation characterized by erosions, friability       and granularity was found in the gastric body and in the gastric antrum       - biopsies obtained to rule out HP. A few fundic gland/hyperplastic       appearing polyps were found in the gastric body - sampled via biopsy       forceps a few. Patchy mildly  erythematous mucosa without active bleeding       and with no stigmata of bleeding was found in the duodenal bulb. A minor       papilla sphincterotomy had been performed. The sphincterotomy appeared       open. One temporary plastic pancreatic stent was emerging from the minor       papilla. One stent was removed from the pancreatic duct using a       rat-toothed forceps after going into a long position and pushing the       duodenum inwards somewhat to try and expel the portion of the pancreatic       duct that had migrated inwards - this was successful. Inspection of the       major papilla revealed that biliary and pancreatic sphincterotomies had       been performed previously. The biliary sphincterotomy appeared open. The       pancreatic sphincterotomy appeared open.      Due to concern for the stent having caused potential pancreatitis due to       inward migration, I wanted to try and ensure there were no retained       mucin stones. A short 0.035 inch Soft Jagwire was passed into the dorsal       pancreatic duct on first attempt through the minor ampullary orifice.       The dorsal pancreatic duct was then deeply cannulated with the Hydratome       sphincterotome. I personally interpreted the pancreatic duct images.       There was appropriate flow of contrast through the ducts - though       overall injection was kept to a minimum due to inability to pursue       Indomethacin placement due to allergy. Image quality was adequate.       Contrast extended to the pancreatic duct. Opacification of the entire       pancreatic ductal system except for the ventral duct in the head       ([place]irsung duct) was successful. The maximum diameter of the ducts was 3       mm. 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 The in the pancreas contained filling defects thought to be        suggestive of mucin vs pancreatic stones. To find the objects the dorsal       pancreatic duct was swept with a retrieval balloon starting at the       pancreatic duct in the tail of the pancreas. A few stones were removed.       No stones remained.      The duodenoscope was withdrawn from the patient. Impression:               - No gross lesions in esophagus. Z-line irregular,                            43 cm from the incisors.                           - Gastritis. Biopsied.                           - Gastric polyps. Biopsied.                           - Erythematous duodenopathy in bulb.                           - Prior minor papilla sphincterotomy appeared open.                            One stent was seen in the minor papilla - this was                            able to be removed.                           - Prior biliary sphincterotomy appeared open.                           - Prior pancreatic sphincterotomy appeared open.                           - Filling defects consistent with mucin vs                            pancreatic stones from recent retained stent were                            seen on the pancreatogram.                           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                           - Pancreatic stones were found  and removed through                            the minor duct. Complete removal was accomplished                            via balloon sweeps. Recommendation:           - The patient will be observed post-procedure,                            until all discharge criteria are met.                           - Return patient to hospital ward for ongoing care.                           - Advance diet as tolerated.                           - Observe patient's clinical course.                           - Await path results.                           - May transition to PO Pepcid 20 mg BID for  next                            21-monthand then to 20 mg QD thereafter. He has                            reported issues with PPIs (though was initiated on                            this when he came in with CGE/Hematemesis).                           - Watch for pancreatitis, bleeding, perforation,                            and cholangitis from today's intervention.                           - Follow up with [facility] for follow up.                           - The findings and recommendations were discussed                            with the patient.                           -  The findings and recommendations were discussed                            with the patient's family. Procedure Code(s):        --- Professional ---                           [phone], Endoscopic retrograde                            cholangiopancreatography (ERCP); with removal of                            foreign body(s) or stent(s) from biliary/pancreatic                            duct(s)                           43264, Endoscopic retrograde                            cholangiopancreatography (ERCP); with removal of                            calculi/debris from biliary/pancreatic duct(s)                           43239, Esophagogastroduodenoscopy, flexible,                            transoral; with biopsy, single or multiple Diagnosis Code(s):        --- Professional ---                           K22.8, Other specified diseases of esophagus                           K29.70, Gastritis, unspecified, without bleeding                           K31.89, Other diseases of stomach and duodenum                           Z96.89, Presence of other specified functional                            implants                           Q45.3, Other congenital malformations of pancreas                            and pancreatic duct                            K86.89, Other specified diseases of pancreas  Z46.59, Encounter for fitting and adjustment of                            other gastrointestinal appliance and device                           T85.898A, Other specified complication of other                            internal prosthetic devices, implants and grafts,                            initial encounter                           K85.80, Other acute pancreatitis without necrosis                            or infection                           R93.2, Abnormal findings on diagnostic imaging of                            liver and biliary tract CPT copyright 2019 American [employer] Association. All rights reserved. The codes documented in this report are preliminary and upon coder review may  be revised to meet current compliance requirements. Justice Britain, MD 11/02/2020 1:33:13 PM Number of Addenda: 0

## 2020-11-02 NOTE — Progress Notes (Signed)
PROGRESS NOTE    Kyle Berry  WGN:[id number] DOB: [birth year] DOA: 10/30/2020 PCP: [name] (Inactive)    Chief Complaint  Patient presents with   Pancreatitis/Hematemesis    Brief Narrative:   Kyle Berry is a 50 y.o. 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  Patient presents with episode of coffee-ground emesis, Suspicious for recurrent pancreatitis, hemiglobin overall remained stable, suspicion for Mallory-Weiss tear, plan for endoscopy tomorrow for stent removal and elevation for source of hematemesis.   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 -Continue with Creon -S/p endoscopy/ERCP on 7/20 by Dr. Rush Landmark nephric and for  - prior minor papilla sphincterotomy appeared open. One stent was seen in the minor papilla - this was able to be removed. - Prior biliary sphincterotomy appeared open. - Prior pancreatic sphincterotomy appeared open. - Filling defects consistent with mucin vs pancreatic stones from recent retained stent were seen on the pancreatogram. 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 - Pancreatic stones were found and removed through the minor duct. Complete removal was accomplished via balloon sweeps  Hematemesis -Hemoglobin minimally trended down, overall remained stable, initially on IV Protonix, currently transitioned to Pepcid. -Endoscopy significant for gastritis, and erythematous duodenopathy in bulb -Continue with Pepcid 20 mg BID for next 46-month and then to 20 mg QD thereafter. He has reported issues with PPIs (though was initiated on this when he came in with  CGE/Hematemesis).  Congenital solitary kidney CKD stage II -Renal function at baseline, continue to monitor closely  Hypertension -Continue with home losartan, continue with as needed amlodipine as well    DVT prophylaxis: SCD ,no  chemical anticoagulation as he presents with coffee-ground emesis.  He was encouraged to ambulate Code Status: Full Family Communication: Patient is coherent and appropriate, all his questions were answered, none at bedside Disposition:   Status is: Inpatient  Remains inpatient appropriate because:IV treatments appropriate due to intensity of illness or inability to take PO  Dispo: The patient is from: Home              Anticipated d/c is to: Home              Patient currently is medically stable to d/c.  Ending endoscopy tomorrow   Difficult to place patient No       Consultants:  Gastroenterology Subjective:  He denies any complaints today, reports abdominal pain significantly improved, no nausea, no vomiting Objective: Vitals:   11/02/20 1322 11/02/20 1330 11/02/20 1340 11/02/20 1401  BP: (!) 157/91 (!) 145/92 (!) 170/88 (!) 162/97  Pulse: 66 70  62  Resp: 15 19  16   Temp: 98.1 F (36.7 C)   97.8 F (36.6 C)  TempSrc: Oral   Oral  SpO2: 94% 97% 98% 99%  Weight:      Height:        Intake/Output Summary (Last 24 hours) at 11/02/2020 1715 Last data filed at 11/02/2020 1503 Gross per 24 hour  Intake 972.99 ml  Output 1451 ml  Net -478.01 ml   Filed Weights   10/30/20 2220 10/31/20 0632  Weight: 80 kg 72.8 kg    Examination:  Awake Alert, Oriented X 3, No new F.N  deficits, Normal affect Symmetrical Chest wall movement, Good air movement bilaterally, CTAB RRR,No Gallops,Rubs or new Murmurs, No Parasternal Heave +ve B.Sounds, Abd Soft, No tenderness, No rebound - guarding or rigidity. No Cyanosis, Clubbing or edema, No new Rash or bruise      Data Reviewed: I have personally reviewed following labs and imaging  studies  CBC: Recent Labs  Lab 10/29/20 0351 10/30/20 2220 10/31/20 0719 11/01/20 0319 11/02/20 0052  WBC 8.6 6.1 5.2 5.0 5.9  HGB 14.2 13.1 11.8* 11.8* 11.0*  HCT 41.9 38.7* 34.2* 34.9* 33.0*  MCV 98.6 99.7 99.7 99.4 100.9*  PLT 221 219 187 179 588    Basic Metabolic Panel: Recent Labs  Lab 10/29/20 0351 10/30/20 2220 10/31/20 0719 11/01/20 0319 11/02/20 0052  NA 136 134* 136 138 136  K 3.5 3.6 3.7 3.5 3.2*  CL 105 100 104 103 101  CO2 21* 23 23 26 28   GLUCOSE 113* 108* 102* 106* 104*  BUN 10 11 8  <5* <5*  CREATININE 1.42* 1.43* 1.18 1.05 1.14  CALCIUM 9.7 9.4 8.3* 8.8* 8.6*  MG  --   --  1.8  --   --   PHOS  --   --  2.4*  --   --     GFR: Estimated Creatinine Clearance: 78.9 mL/min (by C-G formula based on SCr of 1.14 mg/dL).  Liver Function Tests: Recent Labs  Lab 10/29/20 0351 10/30/20 2220 10/31/20 0719 11/01/20 0319 11/02/20 0052  AST 24 30 26 25 26   ALT 19 17 16 17 18   ALKPHOS 60 57 48 48 59  BILITOT 1.6* 1.2 1.1 0.6 0.5  PROT 7.7 7.7 6.3* 5.9* 5.9*  ALBUMIN 4.1 3.9 3.1* 3.0* 2.9*    CBG: No results for input(s): GLUCAP in the last 168 hours.   Recent Results (from the past 240 hour(s))  Resp Panel by RT-PCR (Flu A&B, Covid) Nasopharyngeal Swab     Status: None   Collection Time: 10/31/20  2:58 AM   Specimen: Nasopharyngeal Swab; Nasopharyngeal(NP) swabs in vial transport medium  Result Value Ref Range Status   SARS Coronavirus 2 by RT PCR NEGATIVE NEGATIVE Final    Comment: (NOTE) SARS-CoV-2 target nucleic acids are NOT DETECTED.  The SARS-CoV-2 RNA is generally detectable in upper respiratory specimens during the acute phase of infection. The lowest concentration of SARS-CoV-2 viral copies this assay can detect is 138 copies/mL. A negative result does not preclude SARS-Cov-2 infection and should not be used as the sole basis for treatment or other patient management decisions. A negative result may occur with  improper specimen  collection/handling, submission of specimen other than nasopharyngeal swab, presence of viral mutation(s) within the areas targeted by this assay, and inadequate number of viral copies(<138 copies/mL). A negative result must be combined with clinical observations, patient history, and epidemiological information. The expected result is Negative.  Fact Sheet for Patients:  EntrepreneurPulse.com.au  Fact Sheet for Healthcare Providers:  IncredibleEmployment.be  This test is no t yet approved or cleared by the [place] FDA and  has been authorized for detection and/or diagnosis of SARS-CoV-2 by FDA under an Emergency Use Authorization (EUA). This EUA will remain  in effect (meaning this test can be used) for the duration of the COVID-19 declaration under Section 564(b)(1) of the Act, 21 U.S.C.section 360bbb-3(b)(1), unless the authorization is terminated  or revoked sooner.       Influenza A by PCR NEGATIVE NEGATIVE Final   Influenza B by PCR NEGATIVE NEGATIVE Final  Comment: (NOTE) The Xpert Xpress SARS-CoV-2/FLU/RSV plus assay is intended as an aid in the diagnosis of influenza from Nasopharyngeal swab specimens and should not be used as a sole basis for treatment. Nasal washings and aspirates are unacceptable for Xpert Xpress SARS-CoV-2/FLU/RSV testing.  Fact Sheet for Patients: EntrepreneurPulse.com.au  Fact Sheet for Healthcare Providers: IncredibleEmployment.be  This test is not yet approved or cleared by the [place] FDA and has been authorized for detection and/or diagnosis of SARS-CoV-2 by FDA under an Emergency Use Authorization (EUA). This EUA will remain in effect (meaning this test can be used) for the duration of the COVID-19 declaration under Section 564(b)(1) of the Act, 21 U.S.C. section 360bbb-3(b)(1), unless the authorization is terminated or revoked.  Performed at [facility], [place] [address], [place], [place] [address]   Culture, blood (routine x 2)     Status: None (Preliminary result)   Collection Time: 10/31/20  7:20 AM   Specimen: BLOOD  Result Value Ref Range Status   Specimen Description BLOOD LEFT ANTECUBITAL  Final   Special Requests   Final    BOTTLES DRAWN AEROBIC AND ANAEROBIC Blood Culture adequate volume   Culture   Final    NO GROWTH 2 DAYS Performed at [facility], [place] [address] [place], [place] [address]    Report Status PENDING  Incomplete  Culture, blood (routine x 2)     Status: None (Preliminary result)   Collection Time: 10/31/20  7:25 AM   Specimen: BLOOD  Result Value Ref Range Status   Specimen Description BLOOD LEFT ANTECUBITAL  Final   Special Requests   Final    BOTTLES DRAWN AEROBIC AND ANAEROBIC Blood Culture adequate volume   Culture   Final    NO GROWTH 2 DAYS Performed at [facility], [place] [address] [place], [place] [address]    Report Status PENDING  Incomplete         Radiology Studies: DG ERCP BILIARY & PANCREATIC DUCTS  Result Date: 11/02/2020 CLINICAL DATA:  Recurrent pancreatitis, pancreatic duct stent EXAM: ERCP TECHNIQUE: Multiple spot images obtained with the fluoroscopic device and submitted for interpretation post-procedure. FLUOROSCOPY TIME:  Fluoroscopy Time:  1 minutes 11 seconds Number of Acquired Spot Images: 0 COMPARISON:  CT abdomen/pelvis 10/29/2020 FINDINGS: Eight intraoperative spot images are submitted for review. The images demonstrate a flexible duodenal scope in the descending duodenum followed by wire cannulation of the pancreatic duct. The plastic pancreatic stent is removed. IMPRESSION: ERCP with removal of pancreatic stent. These images were submitted for radiologic interpretation only. Please see the procedural report for the amount of contrast and the fluoroscopy time utilized. Electronically Signed   By: Jacqulynn Cadet M.D.   On: 11/02/2020 15:01         Scheduled Meds:  famotidine  20 mg Oral BID   feeding supplement  237 mL Oral TID BM   lipase/protease/amylase  72,000 Units Oral TID WC   losartan  25 mg Oral Daily   multivitamin with minerals  1 tablet Oral Daily   Continuous Infusions:  promethazine (PHENERGAN) injection (IM or IVPB) 25 mg (11/02/20 1700)     LOS: 2 days      Phillips Climes, MD [facility]   To contact the attending provider between 7A-7P or the covering provider during after hours 7P-7A, please log into the web site [email or web address] and access using universal [place] password for that web site. If you do not have the password,  please call the hospital operator.  11/02/2020, 5:15 PM

## 2020-11-02 NOTE — Anesthesia Postprocedure Evaluation (Signed)
Anesthesia Post Note  Patient: Kyle Berry  Procedure(s) Performed: ENDOSCOPIC RETROGRADE CHOLANGIOPANCREATOGRAPHY (ERCP) BIOPSY STENT REMOVAL REMOVAL OF STONES     Patient location during evaluation: PACU Anesthesia Type: General Level of consciousness: sedated Pain management: pain level controlled Vital Signs Assessment: post-procedure vital signs reviewed and stable Respiratory status: spontaneous breathing and respiratory function stable Cardiovascular status: stable Postop Assessment: no apparent nausea or vomiting Anesthetic complications: no   No notable events documented.  Last Vitals:  Vitals:   11/02/20 1340 11/02/20 1401  BP: (!) 170/88 (!) 162/97  Pulse:  62  Resp:  16  Temp:  36.6 C  SpO2: 98% 99%    Last Pain:  Vitals:   11/02/20 1401  TempSrc: Oral  PainSc: 4                  Fayne Mcguffee DANIEL

## 2020-11-02 NOTE — Anesthesia Preprocedure Evaluation (Signed)
Anesthesia Evaluation  Patient identified by MRN, date of birth, ID band Patient awake    Reviewed: Allergy & Precautions, H&P , NPO status , Patient's Chart, lab work & pertinent test results  Airway Mallampati: II   Neck ROM: full    Dental   Pulmonary neg pulmonary ROS,    breath sounds clear to auscultation       Cardiovascular hypertension,  Rhythm:regular Rate:Normal     Neuro/Psych  Headaches,    GI/Hepatic PUD, GERD  ,Recurrent pancreatitis   Endo/Other    Renal/GU Single kidney     Musculoskeletal   Abdominal   Peds  Hematology   Anesthesia Other Findings   Reproductive/Obstetrics                             Anesthesia Physical Anesthesia Plan  ASA: 2  Anesthesia Plan: General   Post-op Pain Management:    Induction: Intravenous  PONV Risk Score and Plan: 2 and Ondansetron, Midazolam, Treatment may vary due to age or medical condition and Dexamethasone  Airway Management Planned: Oral ETT  Additional Equipment:   Intra-op Plan:   Post-operative Plan: Extubation in OR  Informed Consent: I have reviewed the patients History and Physical, chart, labs and discussed the procedure including the risks, benefits and alternatives for the proposed anesthesia with the patient or authorized representative who has indicated his/her understanding and acceptance.     Dental advisory given  Plan Discussed with: CRNA, Anesthesiologist and Surgeon  Anesthesia Plan Comments:         Anesthesia Quick Evaluation

## 2020-11-02 NOTE — Progress Notes (Signed)
Initial Nutrition Assessment  DOCUMENTATION CODES:   Not applicable  INTERVENTION:  -d/c Boost Breeze now that diet has advanced -Ensure Enlive po TID, each supplement provides 350 kcal and 20 grams of protein -MVI with minerals daily  NUTRITION DIAGNOSIS:   Inadequate oral intake related to chronic illness (recurrent pancreatitis) as evidenced by per patient/family report, energy intake < or equal to 75% for > or equal to 1 month.  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  Pt unavailable at time of RD visit; pt out of room for ERCP, stent to be removed. Diet now being advanced as tolerated. Per RN, pt tolerated clears well and has since been advanced to soft foods. Will provide orders for oral nutrition supplements.   PO Intake: 0-100% x 3 recorded meals  Medications: pepcid, Boost Breeze TID, creon TID, IV phenergan Labs: K+ 3.2 (L)  UOP: 1416ml x24 hours  NUTRITION - FOCUSED PHYSICAL EXAM: Unable to perform at this time as pt is out of room. Will attempt at follow-up.  Diet Order:   Diet Order             DIET SOFT Room service appropriate? Yes; Fluid consistency: Thin  Diet effective now                   EDUCATION NEEDS:   No education needs have been identified at this time  Skin:  Skin Assessment: Reviewed RN Assessment  Last BM:  7/19  Height:   Ht Readings from Last 1 Encounters:  10/30/20 6\' 1"  (1.854 m)    Weight:   Wt Readings from Last 1 Encounters:  10/31/20 72.8 kg    BMI:  Body mass index is 21.17 kg/m.  Estimated  Nutritional Needs:   Kcal:  2200-2400  Protein:  110-120 grams  Fluid:  >2L    Larkin Ina, MS, RD, LDN (she/her/hers) RD pager number and weekend/on-call pager number located in [place].

## 2020-11-02 NOTE — Transfer of Care (Signed)
Immediate Anesthesia Transfer of Care Note  Patient: Kyle Berry  Procedure(s) Performed: ENDOSCOPIC RETROGRADE CHOLANGIOPANCREATOGRAPHY (ERCP) BIOPSY STENT REMOVAL REMOVAL OF STONES  Patient Location: Endoscopy Unit  Anesthesia Type:General  Level of Consciousness: drowsy and patient cooperative  Airway & Oxygen Therapy: Patient Spontanous Breathing  Post-op Assessment: Report given to RN and Post -op Vital signs reviewed and stable  Post vital signs: Reviewed and stable  Last Vitals:  Vitals Value Taken Time  BP 157/91 11/02/20 1322  Temp 36.7 C 11/02/20 1322  Pulse 66 11/02/20 1323  Resp 13 11/02/20 1323  SpO2 93 % 11/02/20 1323  Vitals shown include unvalidated device data.  Last Pain:  Vitals:   11/02/20 1322  TempSrc: Oral  PainSc: 0-No pain         Complications: No notable events documented.

## 2020-11-02 NOTE — Interval H&P Note (Signed)
History and Physical Interval Note:  11/02/2020 11:53 AM  Kyle Berry  has presented today for surgery, with the diagnosis of pancreatitis, removal of pancreatic stent.  The various methods of treatment have been discussed with the patient and family. After consideration of risks, benefits and other options for treatment, the patient has consented to  Procedure(s): ENDOSCOPIC RETROGRADE CHOLANGIOPANCREATOGRAPHY (ERCP) (N/A) as a surgical intervention.  The patient's history has been reviewed, patient examined, no change in status, stable for surgery.  I have reviewed the patient's chart and labs.  Questions were answered to the patient's satisfaction.    The risks of an ERCP were discussed at length, including but not limited to the risk of perforation, bleeding, abdominal pain, post-ERCP pancreatitis (while usually mild can be severe and even life threatening). ]   Irving Copas

## 2020-11-03 DIAGNOSIS — K2990 Gastroduodenitis, unspecified, without bleeding: Secondary | ICD-10-CM | POA: Diagnosis not present

## 2020-11-03 DIAGNOSIS — K2970 Gastritis, unspecified, without bleeding: Secondary | ICD-10-CM

## 2020-11-03 DIAGNOSIS — K861 Other chronic pancreatitis: Secondary | ICD-10-CM | POA: Diagnosis not present

## 2020-11-03 LAB — COMPREHENSIVE METABOLIC PANEL
ALT: 20 U/L (ref 0–44)
AST: 32 U/L (ref 15–41)
Albumin: 3.1 g/dL — ABNORMAL LOW (ref 3.5–5.0)
Alkaline Phosphatase: 62 U/L (ref 38–126)
Anion gap: 13 (ref 5–15)
BUN: 8 mg/dL (ref 6–20)
CO2: 23 mmol/L (ref 22–32)
Calcium: 9.6 mg/dL (ref 8.9–10.3)
Chloride: 99 mmol/L (ref 98–111)
Creatinine, Ser: 1.27 mg/dL — ABNORMAL HIGH (ref 0.61–1.24)
GFR, Estimated: >60 mL/min (ref >60)
Glucose, Bld: 201 mg/dL — ABNORMAL HIGH (ref 70–99)
Potassium: 3.9 mmol/L (ref 3.5–5.1)
Sodium: 135 mmol/L (ref 135–145)
Total Bilirubin: 0.3 mg/dL (ref 0.3–1.2)
Total Protein: 6.4 g/dL — ABNORMAL LOW (ref 6.5–8.1)

## 2020-11-03 LAB — CBC
HCT: 33.1 % — ABNORMAL LOW (ref 39.0–52.0)
Hemoglobin: 11.1 g/dL — ABNORMAL LOW (ref 13.0–17.0)
MCH: 33.3 pg (ref 26.0–34.0)
MCHC: 33.5 g/dL (ref 30.0–36.0)
MCV: 99.4 fL (ref 80.0–100.0)
Platelets: 206 10*3/uL (ref 150–400)
RBC: 3.33 MIL/uL — ABNORMAL LOW (ref 4.22–5.81)
RDW: 15.3 % (ref 11.5–15.5)
WBC: 4.9 10*3/uL (ref 4.0–10.5)
nRBC: 0 % (ref 0.0–0.2)

## 2020-11-03 LAB — SURGICAL PATHOLOGY

## 2020-11-03 LAB — LIPASE, BLOOD: Lipase: 49 U/L (ref 11–51)

## 2020-11-03 MED ORDER — OXYCODONE-ACETAMINOPHEN 5-325 MG PO TABS: 1.0000 | ORAL_TABLET | Freq: Two times a day (BID) | ORAL | 0 refills | Status: DC | PRN

## 2020-11-03 MED ORDER — ADULT MULTIVITAMIN W/MINERALS CH
1.0000 | ORAL_TABLET | Freq: Every day | ORAL | 0 refills | Status: DC
Start: 2020-11-04 — End: 2021-02-04

## 2020-11-03 MED ORDER — OXYCODONE-ACETAMINOPHEN 5-325 MG PO TABS: 1.0000 | ORAL_TABLET | Freq: Two times a day (BID) | ORAL | 0 refills | Status: AC | PRN

## 2020-11-03 MED ORDER — FAMOTIDINE 20 MG PO TABS: 20.0000 mg | ORAL_TABLET | Freq: Two times a day (BID) | ORAL | 0 refills | Status: DC

## 2020-11-03 MED ORDER — POLYETHYLENE GLYCOL 3350 17 G PO PACK: 17.0000 g | PACK | Freq: Every day | ORAL | 0 refills | Status: AC

## 2020-11-03 MED ORDER — ENSURE ENLIVE PO LIQD: 237.0000 mL | Freq: Three times a day (TID) | ORAL | 0 refills | Status: AC

## 2020-11-03 NOTE — Plan of Care (Signed)

## 2020-11-03 NOTE — Discharge Summary (Addendum)
Discharge Summary  Kyle Berry [id number] DOB: Aug 19, 1970  PCP: [name] (Inactive)  Admit date: 10/30/2020 Discharge date: 11/03/2020  Time spent: 35 minutes.  Recommendations for Outpatient Follow-up:  Follow-up with GI at [facility], keep appointment. Follow-up with your PCP.  Discharge Diagnoses:  Active Hospital Problems   Diagnosis Date Noted   Gastritis and gastroduodenitis    Presence of pancreatic duct stent    Pancreatitis 04/12/2020    Resolved Hospital Problems  No resolved problems to display.    Discharge Condition: Stable.  Diet recommendation: Low-fat diet as recommended by GI.  Vitals:   11/03/20 0812 11/03/20 1130  BP: (!) 156/101 (!) 159/98  Pulse: 82 89  Resp: (!) 22 16  Temp: 97.7 F (36.5 C) 97.7 F (36.5 C)  SpO2: 99% 98%    History of present illness:  Kyle Berry is a 50 y.o. male with medical history significant for recurrent idiopathic pancreatitis status post recent pancreatic stent placed at [facility].  Patient presents with episode of hematemesis, Suspicious for recurrent pancreatitis, hemoglobin overall remained stable, suspicion for Mallory-Weiss tear. Post ERCP on 11/02/20.   11/03/20: Patient was seen and examined at his bedside.  He has no new complaint.  He is eager to go home.  Okay to discharge home today from a GI standpoint.  Hospital Course:  Active Problems:   Pancreatitis   Presence of pancreatic duct stent   Gastritis and gastroduodenitis  Recurrent acute on chronic pancreatitis, possibly caused by stent -Extensive work-up in the past, known history of pancreas divisum anatomy, status post ERCP with biliary duct sphincterotomy, pancreatic duct stent placement at [facility]. -Continue with Creon -S/p endoscopy/ERCP on 7/20 by Dr. Rush Landmark nephric and for - prior minor papilla sphincterotomy appeared open. One stent was seen in the minor papilla - this was able to be removed. - Prior biliary sphincterotomy  appeared open. - Prior pancreatic sphincterotomy appeared open. - Filling defects consistent with mucin vs pancreatic stones from recent retained stent were seen on the pancreatogram. 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 - Pancreatic stones were found and removed through the minor duct. Complete removal was accomplished via balloon sweeps As needed Percocet twice daily for breakthrough pain. MiraLAX 17 g daily while on opiates to avoid opioid-induced constipation. Pancreatic stent was removed.   Hematemesis -Hemoglobin minimally trended down, overall remained stable, initially on IV Protonix, currently transitioned to Pepcid. -Endoscopy significant for gastritis, and erythematous duodenopathy in bulb -Continue with Pepcid 20 mg BID for next 3-monthand then to 20 mg QD thereafter. He has reported issues with PPIs (though was initiated on this when he came in with CGE/Hematemesis).  He No acute issues, hemoglobin is stable. Okay to discharge home GI standpoint.  [facility]   Acute blood loss anemia secondary to upper GI bleed Hemoglobin currently stable and slightly uptrending, 11.1 from 11. No overt bleeding at this time. [facility]   Congenital solitary kidney CKD stage II -Renal function at baseline, continue to monitor closely   Hypertension Vital signs reviewed.  BP stable -Continue with home losartan Follow-up with your PCP   Code Status: Full code.     Consultants:  Gastroenterology    Procedures: EGD. ERCP.   Discharge Exam: BP (!) 159/98 (BP Location: Right Arm)   Pulse 89   Temp 97.7 F (36.5 C) (Oral)   Resp 16   Ht '6\' 1"'$  (1.854 m)   Wt 72.8  kg   SpO2 98%   BMI 21.17 kg/m  General: 50 y.o. year-old male well developed well nourished in no acute distress.  Alert and oriented x3. Cardiovascular: Regular rate and rhythm with no rubs or gallops.  No thyromegaly or JVD noted.   Respiratory:  Clear to auscultation with no wheezes or rales. Good inspiratory effort. Abdomen: Soft nontender nondistended with normal bowel sounds x4 quadrants. Musculoskeletal: No lower extremity edema. 2/4 pulses in all 4 extremities. Psychiatry: Mood is appropriate for condition and setting  Discharge Instructions You were cared for by a hospitalist during your hospital stay. If you have any questions about your discharge medications or the care you received while you were in the hospital after you are discharged, you can call the unit and asked to speak with the hospitalist on call if the hospitalist that took care of you is not available. Once you are discharged, your primary care physician will handle any further medical issues. Please note that NO REFILLS for any discharge medications will be authorized once you are discharged, as it is imperative that you return to your primary care physician (or establish a relationship with a primary care physician if you do not have one) for your aftercare needs so that they can reassess your need for medications and monitor your lab values.   Allergies as of 11/03/2020       Reactions   Diclofenac Hives   Norvasc [amlodipine Besylate] Other (See Comments)   Fluid buildup in chest   Omeprazole Other (See Comments)   MD stopped due to pancreatitis   Prednisone Other (See Comments)   Mood swings        Medication List     STOP taking these medications    losartan 25 MG tablet Commonly known as: COZAAR   ondansetron 4 MG disintegrating tablet Commonly known as: Zofran ODT   oxyCODONE 5 MG immediate release tablet Commonly known as: Roxicodone       TAKE these medications    acetaminophen 500 MG tablet Commonly known as: TYLENOL Take 500 mg by mouth every 6 (six) hours as needed for mild pain.   famotidine 20 MG tablet Commonly known as: PEPCID Take 1 tablet (20 mg total) by mouth 2 (two) times daily. Instructions: Take Pepcid 20 mg twice  daily x30 days, then take Pepcid 20 mg daily thereafter.   feeding supplement Liqd Take 237 mLs by mouth 3 (three) times daily between meals for 7 days.   lipase/protease/amylase 36000 UNITS Cpep capsule Commonly known as: CREON Take 2 capsules (72,000 Units total) by mouth 3 (three) times daily with meals.   multivitamin with minerals Tabs tablet Take 1 tablet by mouth daily. Start taking on: November 04, 2020   oxyCODONE-acetaminophen 5-325 MG tablet Commonly known as: Percocet Take 1 tablet by mouth 2 (two) times daily as needed for up to 5 days for severe pain. What changed:  when to take this reasons to take this   polyethylene glycol 17 g packet Commonly known as: MiraLax Take 17 g by mouth daily for 5 days.   promethazine 25 MG tablet Commonly known as: PHENERGAN Take 25 mg by mouth every 6 (six) hours as needed for nausea or vomiting.   telmisartan 40 MG tablet Commonly known as: MICARDIS Take 40 mg by mouth daily.       Allergies  Allergen Reactions   Diclofenac Hives   Norvasc [Amlodipine Besylate] Other (See Comments)    Fluid buildup in chest  Omeprazole Other (See Comments)    MD stopped due to pancreatitis   Prednisone Other (See Comments)    Mood swings    Follow-up Information     [name], [name]. Call today.   Why: Please call for a posthospital follow-up appointment. Contact information: [place] [facility] [place] [address] [phone]         Family Medicine at [facility]. Go on 11/07/2020.   Why: Please attend your hospital follow up appointment with Dr Quentin Cornwall on Monday, 11/07/20, at 130pm. Contact information: [address], [place] Phone: [phone]                 The results of significant diagnostics from this hospitalization (including imaging, microbiology, ancillary and laboratory) are listed below for reference.    Significant Diagnostic Studies: DG  ERCP BILIARY & PANCREATIC DUCTS  Result Date: 11/02/2020 CLINICAL DATA:  Recurrent pancreatitis, pancreatic duct stent EXAM: ERCP TECHNIQUE: Multiple spot images obtained with the fluoroscopic device and submitted for interpretation post-procedure. FLUOROSCOPY TIME:  Fluoroscopy Time:  1 minutes 11 seconds Number of Acquired Spot Images: 0 COMPARISON:  CT abdomen/pelvis 10/29/2020 FINDINGS: Eight intraoperative spot images are submitted for review. The images demonstrate a flexible duodenal scope in the descending duodenum followed by wire cannulation of the pancreatic duct. The plastic pancreatic stent is removed. IMPRESSION: ERCP with removal of pancreatic stent. These images were submitted for radiologic interpretation only. Please see the procedural report for the amount of contrast and the fluoroscopy time utilized. Electronically Signed   By: Jacqulynn Cadet M.D.   On: 11/02/2020 15:01   CT Renal Stone Study  Result Date: 10/29/2020 CLINICAL DATA:  Flank pain. Concern for kidney stone. History of pancreatitis. EXAM: CT ABDOMEN AND PELVIS WITHOUT CONTRAST TECHNIQUE: Multidetector CT imaging of the abdomen and pelvis was performed following the standard protocol without IV contrast. COMPARISON:  07/21/2020 FINDINGS: Lower chest: Lung bases are clear. Hepatobiliary: No focal hepatic lesion. Postcholecystectomy. No biliary dilatation. Pancreas: Stent within the pancreatic duct extending to the duodenum. Mild haziness in the peripancreatic fat is similar to comparison exam. There is small amount of fluid along the anterior LEFT peritoneal fascia also similar to comparison exam. (Image 31/4). No organized fluid collections Spleen: Normal spleen Adrenals/urinary tract: Adrenal glands normal. The RIGHT kidney is severely atrophic. Two nonobstructing calculi in lower pole of the LEFT kidney measuring 6 mm and 5 mm. No LEFT ureterolithiasis or obstructive uropathy. No bladder calculi Stomach/Bowel: Stomach,  small bowel, appendix, and cecum are normal. The colon and rectosigmoid colon are normal. Vascular/Lymphatic: Abdominal aorta is normal caliber. No periportal or retroperitoneal adenopathy. No pelvic adenopathy. Reproductive: Prostate un remark Other: No free fluid. Musculoskeletal: No aggressive osseous lesion. IMPRESSION: 1. Findings consistent with mild acute pancreatitis however findings are very similar to CT 08/10/2020 therefore would also consider chronic pancreatitis or acute on chronic pancreatitis. 2. New pancreatic duct stent. 3. Two nonobstructing LEFT renal calculi. No ureterolithiasis or obstructive uropathy. 4. Severely atrophic RIGHT kidney. Electronically Signed   By: Suzy Bouchard M.D.   On: 10/29/2020 09:31    Microbiology: Recent Results (from the past 240 hour(s))  Resp Panel by RT-PCR (Flu A&B, Covid) Nasopharyngeal Swab     Status: None   Collection Time: 10/31/20  2:58 AM   Specimen: Nasopharyngeal Swab; Nasopharyngeal(NP) swabs in vial transport medium  Result Value Ref Range Status   SARS Coronavirus 2 by RT PCR NEGATIVE NEGATIVE Final  Comment: (NOTE) SARS-CoV-2 target nucleic acids are NOT DETECTED.  The SARS-CoV-2 RNA is generally detectable in upper respiratory specimens during the acute phase of infection. The lowest concentration of SARS-CoV-2 viral copies this assay can detect is 138 copies/mL. A negative result does not preclude SARS-Cov-2 infection and should not be used as the sole basis for treatment or other patient management decisions. A negative result may occur with  improper specimen collection/handling, submission of specimen other than nasopharyngeal swab, presence of viral mutation(s) within the areas targeted by this assay, and inadequate number of viral copies(<138 copies/mL). A negative result must be combined with clinical observations, patient history, and epidemiological information. The expected result is Negative.  Fact Sheet for  Patients:  EntrepreneurPulse.com.au  Fact Sheet for Healthcare Providers:  IncredibleEmployment.be  This test is no t yet approved or cleared by the [place] FDA and  has been authorized for detection and/or diagnosis of SARS-CoV-2 by FDA under an Emergency Use Authorization (EUA). This EUA will remain  in effect (meaning this test can be used) for the duration of the COVID-19 declaration under Section 564(b)(1) of the Act, 21 U.S.C.section 360bbb-3(b)(1), unless the authorization is terminated  or revoked sooner.       Influenza A by PCR NEGATIVE NEGATIVE Final   Influenza B by PCR NEGATIVE NEGATIVE Final    Comment: (NOTE) The Xpert Xpress SARS-CoV-2/FLU/RSV plus assay is intended as an aid in the diagnosis of influenza from Nasopharyngeal swab specimens and should not be used as a sole basis for treatment. Nasal washings and aspirates are unacceptable for Xpert Xpress SARS-CoV-2/FLU/RSV testing.  Fact Sheet for Patients: EntrepreneurPulse.com.au  Fact Sheet for Healthcare Providers: IncredibleEmployment.be  This test is not yet approved or cleared by the [place] FDA and has been authorized for detection and/or diagnosis of SARS-CoV-2 by FDA under an Emergency Use Authorization (EUA). This EUA will remain in effect (meaning this test can be used) for the duration of the COVID-19 declaration under Section 564(b)(1) of the Act, 21 U.S.C. section 360bbb-3(b)(1), unless the authorization is terminated or revoked.  Performed at [facility], [place] [address] [place], [place] [address]   Culture, blood (routine x 2)     Status: None (Preliminary result)   Collection Time: 10/31/20  7:20 AM   Specimen: BLOOD  Result Value Ref Range Status   Specimen Description BLOOD LEFT ANTECUBITAL  Final   Special Requests   Final    BOTTLES DRAWN AEROBIC AND ANAEROBIC Blood Culture adequate volume    Culture   Final    NO GROWTH 2 DAYS Performed at [facility], [place] [address] [place], [place] [address]    Report Status PENDING  Incomplete  Culture, blood (routine x 2)     Status: None (Preliminary result)   Collection Time: 10/31/20  7:25 AM   Specimen: BLOOD  Result Value Ref Range Status   Specimen Description BLOOD LEFT ANTECUBITAL  Final   Special Requests   Final    BOTTLES DRAWN AEROBIC AND ANAEROBIC Blood Culture adequate volume   Culture   Final    NO GROWTH 2 DAYS Performed at [facility], [place] [address], [place], [place] [address]    Report Status PENDING  Incomplete     Labs: Basic Metabolic Panel: Recent Labs  Lab 10/30/20 2220 10/31/20 0719 11/01/20 0319 11/02/20 0052 11/03/20 0214  NA 134* 136 138 136 135  K 3.6 3.7 3.5 3.2* 3.9  CL 100 104  103 101 99  CO2 '23 23 26 28 23  '$ GLUCOSE 108* 102* 106* 104* 201*  BUN 11 8 <5* <5* 8  CREATININE 1.43* 1.18 1.05 1.14 1.27*  CALCIUM 9.4 8.3* 8.8* 8.6* 9.6  MG  --  1.8  --   --   --   PHOS  --  2.4*  --   --   --    Liver Function Tests: Recent Labs  Lab 10/30/20 2220 10/31/20 0719 11/01/20 0319 11/02/20 0052 11/03/20 0214  AST '30 26 25 26 '$ 32  ALT '17 16 17 18 20  '$ ALKPHOS 57 48 48 59 62  BILITOT 1.2 1.1 0.6 0.5 0.3  PROT 7.7 6.3* 5.9* 5.9* 6.4*  ALBUMIN 3.9 3.1* 3.0* 2.9* 3.1*   Recent Labs  Lab 10/29/20 0351 10/30/20 2220 11/03/20 0214  LIPASE 127* 63* 49   No results for input(s): AMMONIA in the last 168 hours. CBC: Recent Labs  Lab 10/30/20 2220 10/31/20 0719 11/01/20 0319 11/02/20 0052 11/03/20 0214  WBC 6.1 5.2 5.0 5.9 4.9  HGB 13.1 11.8* 11.8* 11.0* 11.1*  HCT 38.7* 34.2* 34.9* 33.0* 33.1*  MCV 99.7 99.7 99.4 100.9* 99.4  PLT 219 187 179 186 206   Cardiac Enzymes: No results for input(s): CKTOTAL, CKMB, CKMBINDEX, TROPONINI in the last 168 hours. BNP: BNP (last 3 results) No results for input(s): BNP in the last 8760 hours.  ProBNP (last 3 results) No results  for input(s): PROBNP in the last 8760 hours.  CBG: No results for input(s): GLUCAP in the last 168 hours.     Signed:  Kayleen Memos, MD [facility] 11/03/2020, 2:19 PM

## 2020-11-03 NOTE — Progress Notes (Signed)
      Progress Note   Subjective  Patient doing much better today. Eating diet, states his pain has resolved, wants to go home. S/p EGD / ERCP with Dr. Rush Landmark yesterday.    Objective   Vital signs in last 24 hours: Temp:  [97.7 F (36.5 C)-98.5 F (36.9 C)] 97.7 F (36.5 C) (07/21 0812) Pulse Rate:  [62-82] 82 (07/21 0812) Resp:  [15-22] 22 (07/21 0812) BP: (141-170)/(69-101) 156/101 (07/21 0812) SpO2:  [94 %-100 %] 99 % (07/21 0812) Last BM Date: 11/01/20 General:    AA male in NAD Abdomen:  Soft, nontender and nondistended.  Extremities:  Without edema. Neurologic:  Alert and oriented,  grossly normal neurologically. Psych:  Cooperative. Normal mood and affect.  Intake/Output from previous day: 07/20 0701 - 07/21 0700 In: 1438.7 [P.O.:240; I.V.:600; IV Piggyback:598.7] Out: 1 [Urine:1] Intake/Output this shift: Total I/O In: -  Out: 575 [Urine:575]  Lab Results: Recent Labs    11/01/20 0319 11/02/20 0052 11/03/20 0214  WBC 5.0 5.9 4.9  HGB 11.8* 11.0* 11.1*  HCT 34.9* 33.0* 33.1*  PLT 179 186 206   BMET Recent Labs    11/01/20 0319 11/02/20 0052 11/03/20 0214  NA 138 136 135  K 3.5 3.2* 3.9  CL 103 101 99  CO2 26 28 23   GLUCOSE 106* 104* 201*  BUN <5* <5* 8  CREATININE 1.05 1.14 1.27*  CALCIUM 8.8* 8.6* 9.6   LFT Recent Labs    11/03/20 0214  PROT 6.4*  ALBUMIN 3.1*  AST 32  ALT 20  ALKPHOS 62  BILITOT 0.3   PT/INR No results for input(s): LABPROT, INR in the last 72 hours.  Studies/Results: DG ERCP BILIARY & PANCREATIC DUCTS  Result Date: 11/02/2020 CLINICAL DATA:  Recurrent pancreatitis, pancreatic duct stent EXAM: ERCP TECHNIQUE: Multiple spot images obtained with the fluoroscopic device and submitted for interpretation post-procedure. FLUOROSCOPY TIME:  Fluoroscopy Time:  1 minutes 11 seconds Number of Acquired Spot Images: 0 COMPARISON:  CT abdomen/pelvis 10/29/2020 FINDINGS: Eight intraoperative spot images are submitted for  review. The images demonstrate a flexible duodenal scope in the descending duodenum followed by wire cannulation of the pancreatic duct. The plastic pancreatic stent is removed. IMPRESSION: ERCP with removal of pancreatic stent. These images were submitted for radiologic interpretation only. Please see the procedural report for the amount of contrast and the fluoroscopy time utilized. Electronically Signed   By: Jacqulynn Cadet M.D.   On: 11/02/2020 15:01       Assessment / Plan:    50 y/o male with acute on chronic pancreatitis of unclear etiology. History of pancreatic divisum, s/p ERCP on 08/19/20 at [facility] with sphincterotomy and pancreatic duct stent placement. Admitted with recurrent pancreatitis, also presented with some scant hematemesis in the setting of wretching / vomiting.  EGD / ERCP done yesterday - remarkable findings include gastritis, benign gastric polyp, pancreatic stent removed, pancreatic duct was swept and a few stones removed. No high risk pathology for bleeding.   Patient has intolerance of PPI, placed on pepcid. He is doing much better today. Tolerating PO, pain controlled. Labs stable. I think okay to go home from GI perspective today on low fat diet, he has follow up scheduled with [facility] next week. He should continue pepcid in the interim. He agrees with the plan, please call with questions in the interim.  Jolly Mango, MD [facility]

## 2020-11-03 NOTE — Progress Notes (Signed)
CSW met with pt to address readmission risk items.  Pt reports he is seen at [facility].  Pt has own vehicle and drives self to appts.  CSW spoke with office for [facility] is no longer with the practice, pt has been assigned to Dr Quentin Cornwall instead.  Lurline Idol, MSW, LCSW 7/21/20222:21 PM

## 2020-11-05 LAB — CULTURE, BLOOD (ROUTINE X 2)
Culture: NO GROWTH
Culture: NO GROWTH
Special Requests: ADEQUATE
Special Requests: ADEQUATE

## 2020-11-06 ENCOUNTER — Encounter: Payer: Self-pay | Admitting: Gastroenterology

## 2020-11-16 ENCOUNTER — Encounter (HOSPITAL_COMMUNITY): Payer: Self-pay | Admitting: Emergency Medicine

## 2020-11-16 ENCOUNTER — Emergency Department (HOSPITAL_COMMUNITY): Payer: Managed Care, Other (non HMO)

## 2020-11-16 ENCOUNTER — Observation Stay (HOSPITAL_COMMUNITY)
Admission: EM | Admit: 2020-11-16 | Discharge: 2020-11-18 | Disposition: A | Discharge status: Home / Self Care | Payer: Managed Care, Other (non HMO) | Attending: Emergency Medicine | Admitting: Emergency Medicine

## 2020-11-16 ENCOUNTER — Other Ambulatory Visit: Payer: Self-pay

## 2020-11-16 DIAGNOSIS — K859 Acute pancreatitis without necrosis or infection, unspecified: Secondary | ICD-10-CM | POA: Diagnosis present

## 2020-11-16 DIAGNOSIS — R079 Chest pain, unspecified: Secondary | ICD-10-CM | POA: Diagnosis present

## 2020-11-16 DIAGNOSIS — R0789 Other chest pain: Principal | ICD-10-CM | POA: Insufficient documentation

## 2020-11-16 DIAGNOSIS — K861 Other chronic pancreatitis: Secondary | ICD-10-CM | POA: Diagnosis present

## 2020-11-16 DIAGNOSIS — U071 COVID-19: Secondary | ICD-10-CM | POA: Diagnosis not present

## 2020-11-16 DIAGNOSIS — I10 Essential (primary) hypertension: Secondary | ICD-10-CM | POA: Diagnosis not present

## 2020-11-16 DIAGNOSIS — Z79899 Other long term (current) drug therapy: Secondary | ICD-10-CM | POA: Insufficient documentation

## 2020-11-16 DIAGNOSIS — R9431 Abnormal electrocardiogram [ECG] [EKG]: Secondary | ICD-10-CM

## 2020-11-16 LAB — CBC WITH DIFFERENTIAL/PLATELET
Abs Immature Granulocytes: 0.02 10*3/uL (ref 0.00–0.07)
Basophils Absolute: 0 10*3/uL (ref 0.0–0.1)
Basophils Relative: 0 %
Eosinophils Absolute: 0.2 10*3/uL (ref 0.0–0.5)
Eosinophils Relative: 2 %
HCT: 36.6 % — ABNORMAL LOW (ref 39.0–52.0)
Hemoglobin: 12.1 g/dL — ABNORMAL LOW (ref 13.0–17.0)
Immature Granulocytes: 0 %
Lymphocytes Relative: 36 %
Lymphs Abs: 3.2 10*3/uL (ref 0.7–4.0)
MCH: 32.7 pg (ref 26.0–34.0)
MCHC: 33.1 g/dL (ref 30.0–36.0)
MCV: 98.9 fL (ref 80.0–100.0)
Monocytes Absolute: 0.4 10*3/uL (ref 0.1–1.0)
Monocytes Relative: 5 %
Neutro Abs: 5.2 10*3/uL (ref 1.7–7.7)
Neutrophils Relative %: 57 %
Platelets: 299 10*3/uL (ref 150–400)
RBC: 3.7 MIL/uL — ABNORMAL LOW (ref 4.22–5.81)
RDW: 15.2 % (ref 11.5–15.5)
WBC: 9.1 10*3/uL (ref 4.0–10.5)
nRBC: 0 % (ref 0.0–0.2)

## 2020-11-16 MED ORDER — NITROGLYCERIN 0.4 MG SL SUBL
0.4000 mg | SUBLINGUAL_TABLET | SUBLINGUAL | Status: DC | PRN
  Administered 2020-11-17 (×4): 0.4 mg via SUBLINGUAL
  Filled 2020-11-16 (×2): qty 1

## 2020-11-16 MED ORDER — ONDANSETRON HCL 4 MG/2ML IJ SOLN
4.0000 mg | Freq: Once | INTRAMUSCULAR | Status: AC
  Administered 2020-11-16: 4 mg via INTRAVENOUS
  Filled 2020-11-16: qty 2

## 2020-11-16 MED ORDER — MORPHINE SULFATE (PF) 4 MG/ML IV SOLN
4.0000 mg | Freq: Once | INTRAVENOUS | Status: AC
  Administered 2020-11-16: 4 mg via INTRAVENOUS
  Filled 2020-11-16: qty 1

## 2020-11-16 NOTE — ED Provider Notes (Signed)
Emergency Medicine Provider Triage Evaluation Note  Kyle Berry , a 50 y.o. male  was evaluated in triage.  Pt complains of intermittent chest discomfort in the center of his chest radiating to the shoulder blades and jaw.  He has had some sweating and diaphoresis.  Symptoms have been ongoing intermittently over the past several days but have become more persistent and severe which prompted his ED evaluation.  No prior history of ACS but patient does appear somewhat uncomfortable.  He is slightly diaphoretic.   Review of Systems  Positive: CP/SOB/Diaphoresis  Negative: Abdominal pain, nausea, and vomiting   Physical Exam  BP (!) 179/109 (BP Location: Right Arm)   Pulse 99   Temp 98.1 F (36.7 C) (Oral)   Resp 20   Ht '6\' 1"'$  (1.854 m)   Wt 74.8 kg   SpO2 95%   BMI 21.77 kg/m  Gen:   Awake, Appears uncomfortable with mild diaphoresis.  Resp:  Normal effort  MSK:   Moves extremities without difficulty  Other:  No severe abdominal tenderness.   Medical Decision Making  Medically screening exam initiated at 10:18 PM.  Appropriate orders placed.  [name] was informed that the remainder of the evaluation will be completed by another provider, this initial triage assessment does not replace that evaluation, and the importance of remaining in the ED until their evaluation is complete.  10:15 PM Evaluated patient after his EKG was handed to me.  He does appear uncomfortable.  His EKG shows ST changes in V2 and V3 which do seem different from his prior tracings with [place].  I do not see reciprocal change.  EKG is equivocal. Given that he has a fairly concerning story and looks uncomfortable I will discuss with the STEMI MD on call.    Margette Fast, MD 11/17/20 1059

## 2020-11-16 NOTE — ED Triage Notes (Addendum)
Pt here for centralized cp that started 3-4 days ago, endorses shortness of breath that started today, pain radiates to shoulder blades and jaw. Pt diaphoretic and sob in triage

## 2020-11-16 NOTE — ED Provider Notes (Signed)
Emergency Department Provider Note   I have reviewed the triage vital signs and the nursing notes.   HISTORY  Chief Complaint Chest Pain and Shortness of Breath   HPI Kyle Berry is a 50 y.o. male with past medical history of elevated blood pressure and high cholesterol presents to the emergency department with chest pain.  Symptoms have been intermittent over the past 3 to 4 days but become more constant today. Pain radiates to the back and left neck intermittently. No clear provoking or modifying factors. Recent ERCP but pain seems "not the same at all" in comparison. No vomiting or diarrhea. No fever.   Past Medical History:  Diagnosis Date   Chronic bronchitis (HCC)    Chronic lower back pain    Congenital single kidney    GERD (gastroesophageal reflux disease)    Headache    "once/month" (05/13/2017)   High cholesterol    Hypertension    Migraine    "a couple/year" (05/13/2017)   Nephrolithiasis 05/13/2017   Pancreatitis    Pneumonia ~ 09/2015   Recurrent acute pancreatitis     Patient Active Problem List   Diagnosis Date Noted   Chest pain, rule out acute myocardial infarction 11/17/2020   COVID-19 virus infection 11/17/2020   Gastritis and gastroduodenitis    Presence of pancreatic duct stent    Pancreatitis 04/12/2020   Urinary tract infection without hematuria    Symptomatic anemia 11/05/2019   Acute lower GI bleeding 11/02/2019   Near syncope 11/02/2019   AKI (acute kidney injury) ([place]) 11/02/2019   Colonic ulcer    Rectal bleeding 10/14/2019   Pancreatic insufficiency 05/28/2019   Recurrent acute pancreatitis 10/17/2018   Microscopic hematuria 10/17/2018   Acute pancreatitis 07/23/2018   Pancreatic divisum    Hematemesis 10/18/2017   Recurrent pancreatitis 05/13/2017   Congenital single kidney 05/13/2017   High cholesterol 05/13/2017   Kidney stone 05/13/2017   Acute on chronic pancreatitis ([place]) 01/17/2017   Hypokalemia    Nausea and  vomiting    Essential hypertension    Esophageal reflux    Chronic pancreatitis ([place]) 11/23/2015    Past Surgical History:  Procedure Laterality Date   BIOPSY  11/02/2020   Procedure: BIOPSY;  Surgeon: Irving Copas., MD;  Location: [facility];  Service: Gastroenterology;;   CHOLECYSTECTOMY N/A 10/23/2017   Procedure: LAPAROSCOPIC CHOLECYSTECTOMY WITH INTRAOPERATIVE CHOLANGIOGRAM;  Surgeon: Johnathan Hausen, MD;  Location: [facility];  Service: General;  Laterality: N/A;   COLONOSCOPY WITH PROPOFOL N/A 11/02/2019   Procedure: COLONOSCOPY WITH PROPOFOL;  Surgeon: Irene Shipper, MD;  Location[facility] END[facility]COPY;  Service: Endoscopy;  Laterality: N/A;   ERCP N/A 11/02/2020   Procedure: ENDOSCOPIC RETROGRADE CHOLANGIOPANCREATOGRAPHY (ERCP);  Surgeon: Irving Copas., MD;  Location: [place];  Service: Gastroenterology;  Laterality: N/A;   ESOPHAGOGASTRODUODENOSCOPY N/A 11/02/2020   Procedure: ESOPHAGOGASTRODUODENOSCOPY (EGD);  Surgeon: Irving Copas., MD;  Location: [place];  Service: Gastroenterology;  Laterality: N/A;   ESOPHAGOGASTRODUODENOSCOPY (EGD) WITH PROPOFOL N/A 11/06/2019   Procedure: ESOPHAGOGASTRODUODENOSCOPY (EGD) WITH PROPOFOL;  Surgeon: Irene Shipper, MD;  Location: [facility] ENDOSCOPY;  Service: Endoscopy;  Laterality: N/A;   HEMOSTASIS CLIP PLACEMENT  11/02/2019   Procedure: HEMOSTASIS CLIP PLACEMENT;  Surgeon: Irene Shipper, MD;  Location: [facility] ENDOSCOPY;  Service: Endoscopy;;   NO PAST SURGERIES     REMOVAL OF STONES  11/02/2020   Procedure: REMOVAL OF STONES;  Surgeon: Irving Copas., MD;  Location: [place];  Service: Gastroenterology;;   Lavell Islam  REMOVAL  11/02/2020   Procedure: STENT REMOVAL;  Surgeon: Irving Copas., MD;  Location: [facility] ENDOSCOPY;  Service: Gastroenterology;;    Allergies Diclofenac, Norvasc [amlodipine besylate], Omeprazole, and Prednisone  Family History  Problem Relation Age of Onset   Hypertension Other     Hypertension Mother    Hypertension Father    Kidney disease Father    Hypertension Sister    Diabetes Sister    Hypertension Brother    Pancreatic cancer Paternal Grandmother    Colon cancer Cousin    Stomach cancer Neg Hx    Esophageal cancer Neg Hx     Social History Social History   Tobacco Use   Smoking status: Never   Smokeless tobacco: Never  Vaping Use   Vaping Use: Never used  Substance Use Topics   Alcohol use: Not Currently    Comment: 05/13/2017 "might have beer a few times/year"   Drug use: No    Review of Systems  Constitutional: No fever/chills Eyes: No visual changes. ENT: No sore throat. Cardiovascular: Positive chest pain. Respiratory: Positive shortness of breath. Gastrointestinal: No abdominal pain.  No nausea, no vomiting.  No diarrhea.  No constipation. Genitourinary: Negative for dysuria. Musculoskeletal: Negative for back pain. Skin: Negative for rash. Neurological: Negative for headaches, focal weakness or numbness.  10-point ROS otherwise negative.  ____________________________________________   PHYSICAL EXAM:  VITAL SIGNS: ED Triage Vitals  Enc Vitals Group     BP 11/16/20 2157 (!) 179/109     Pulse Rate 11/16/20 2157 99     Resp 11/16/20 2157 20     Temp 11/16/20 2157 98.1 F (36.7 C)     Temp Source 11/16/20 2157 Oral     SpO2 11/16/20 2157 95 %     Weight 11/16/20 2158 165 lb (74.8 kg)     Height 11/16/20 2158 '6\' 1"'$  (1.854 m)   Constitutional: Alert and oriented. Patient appears uncomfortable in triage with some diaphoresis.  Eyes: Conjunctivae are normal. Head: Atraumatic. Nose: No congestion/rhinnorhea. Mouth/Throat: Mucous membranes are moist.  Neck: No stridor.  Cardiovascular: Normal rate, regular rhythm. Good peripheral circulation. Grossly normal heart sounds.   Respiratory: Normal respiratory effort.  No retractions. Lungs CTAB. Gastrointestinal: Soft and nontender. No distention.  Musculoskeletal: No gross  deformities of extremities. Neurologic:  Normal speech and language. No gross focal neurologic deficits are appreciated.  Skin:  Skin is warm, dry and intact. No rash noted.   ____________________________________________   LABS (all labs ordered are listed, but only abnormal results are displayed)  Labs Reviewed  RESP PANEL BY RT-PCR (FLU A&B, COVID) ARPGX2 - Abnormal; Notable for the following components:      Result Value   SARS Coronavirus 2 by RT PCR POSITIVE (*)    All other components within normal limits  COMPREHENSIVE METABOLIC PANEL - Abnormal; Notable for the following components:   Sodium 132 (*)    Glucose, Bld 100 (*)    All other components within normal limits  CBC WITH DIFFERENTIAL/PLATELET - Abnormal; Notable for the following components:   RBC 3.70 (*)    Hemoglobin 12.1 (*)    HCT 36.6 (*)    All other components within normal limits  LIPASE, BLOOD - Abnormal; Notable for the following components:   Lipase 59 (*)    All other components within normal limits  TROPONIN I (HIGH SENSITIVITY)  TROPONIN I (HIGH SENSITIVITY)   ____________________________________________  EKG  EKG 1:   Rate: 99 PR: 156 QTc: 426  Sinus rhythm. Narrow QRS. ST elevation V2-3 with concave ST segment and elevated J point.    EKG 2:   Unchanged.  ____________________________________________  [id number]  DG Chest Portable 1 View  Result Date: 11/16/2020 CLINICAL DATA:  Chest pain. EXAM: PORTABLE CHEST 1 VIEW COMPARISON:  Chest radiograph dated 05/29/2020 FINDINGS: Minimal left lung base atelectasis/scarring. No focal consolidation, pleural effusion, pneumothorax. The cardiac silhouette is within normal limits. No acute osseous pathology. IMPRESSION: No active disease. Electronically Signed   By: Anner Crete M.D.   On: 11/16/2020 23:08   CT Angio Chest/Abd/Pel for Dissection W and/or Wo Contrast  Result Date: 11/17/2020 CLINICAL DATA:  Chest and back pain. Concern for  aortic dissection. EXAM: CT ANGIOGRAPHY CHEST, ABDOMEN AND PELVIS TECHNIQUE: Non-contrast CT of the chest was initially obtained. Multidetector CT imaging through the chest, abdomen and pelvis was performed using the standard protocol during bolus administration of intravenous contrast. Multiplanar reconstructed images and MIPs were obtained and reviewed to evaluate the vascular anatomy. CONTRAST:  136m OMNIPAQUE IOHEXOL 350 MG/ML SOLN COMPARISON:  Chest radiograph dated 11/16/2020 and CT abdomen pelvis dated 10/29/2020. FINDINGS: CTA CHEST FINDINGS Cardiovascular: There is no cardiomegaly or pericardial effusion. The thoracic aorta is unremarkable. The origins of the great vessels of the aortic arch appear patent. No pulmonary artery embolus identified. Mediastinum/Nodes: There is mild thickening of the left hilar soft tissues with thickening of the left upper lobe bronchi. Findings likely represent an inflammatory process. An neoplastic process is less likely but not excluded. Clinical correlation and follow-up recommended. No adenopathy. The esophagus and the thyroid gland are grossly unremarkable. No mediastinal fluid collection. Lungs/Pleura: Left upper lobe peribronchial thickening and streaky and hazy densities in the left upper lobe most consistent with an infectious bronchitis or bronchopneumonia. Linear bibasilar atelectasis/scarring. No focal consolidation, pleural effusion, or pneumothorax. The central airways remain patent. Musculoskeletal: No chest wall abnormality. No acute or significant osseous findings. Review of the MIP images confirms the above findings. CTA ABDOMEN AND PELVIS FINDINGS VASCULAR Aorta: Normal caliber aorta without aneurysm, dissection, vasculitis or significant stenosis. Celiac: Patent without evidence of aneurysm, dissection, vasculitis or significant stenosis. SMA: Patent without evidence of aneurysm, dissection, vasculitis or significant stenosis. Renals: The left renal artery  is patent. The right renal artery is diminutive and not identified. IMA: Patent without evidence of aneurysm, dissection, vasculitis or significant stenosis. Inflow: Patent without evidence of aneurysm, dissection, vasculitis or significant stenosis. Apparent faint linear band in the right internal iliac artery, likely scarring. Veins: No obvious venous abnormality within the limitations of this arterial phase study. Review of the MIP images confirms the above findings. NON-VASCULAR Hepatobiliary: The liver is unremarkable. No intrahepatic biliary dilatation. Cholecystectomy. No retained calcified stone in the central CBD. Pancreas: Inflammatory changes of the pancreas consistent with acute pancreatitis. No drainable fluid collection/abscess or pseudocyst. Spleen: Normal in size without focal abnormality. Adrenals/Urinary Tract: The adrenal glands are unremarkable. The right kidney is atrophic. A 5 mm high attenuating structure in the inferior pole of the left kidney may represent a stone or excreted contrast. No hydronephrosis. The left ureter and the urinary bladder appear unremarkable. Stomach/Bowel: There is no bowel obstruction or active inflammation. The appendix is normal. Lymphatic: No adenopathy. Reproductive: The prostate and seminal vesicles are grossly unremarkable. No pelvic mass Other: None Musculoskeletal: Degenerative changes at L5-S1. No acute osseous pathology. Review of the MIP images confirms the above findings. IMPRESSION: 1. No aortic aneurysm or dissection. No pulmonary artery embolus identified. 2. Acute pancreatitis.  No drainable fluid collection/abscess or pseudocyst. 3. Atrophic right kidney. Electronically Signed   By: Anner Crete M.D.   On: 11/17/2020 02:31    ____________________________________________   PROCEDURES  Procedure(s) performed:   Procedures  None ____________________________________________   INITIAL IMPRESSION / ASSESSMENT AND PLAN / ED  COURSE  Pertinent labs & imaging results that were available during my care of the patient were reviewed by me and considered in my medical decision making (see chart for details).   Patient appears uncomfortable and diaphoretic in triage. No STEMI activation as EKG does not meet criteria but will review with STEMI MD as tracing does seem different from March 2022. Differential includes ACS, PE, PNA. Doubt dissection clinically. Does not seem abdominal but will send LFTs and Lipase with recent ERCP.   10:50 PM Spoke with Dr. Ellyn Hack with STEMI team. Looks similar to Feb 2022 tracing. More consistent with LVH. No STEMI activation. Can follow troponin and imaging. Patient re-evaluated and pain resolved after nitro. No ACS history. Recent ERCP with idiopathic pancreatitis but insists pain is not typical of pancreatitis pain.   Labs pending. Care transferred to Dr. Wyvonnia Dusky.  ____________________________________________  FINAL CLINICAL IMPRESSION(S) / ED DIAGNOSES  Final diagnoses:  Chest pain, unspecified type  Abnormal EKG     MEDICATIONS GIVEN DURING THIS VISIT:  Medications  nitroGLYCERIN (NITROSTAT) SL tablet 0.4 mg (0.4 mg Sublingual Given 11/17/20 0925)  irbesartan (AVAPRO) tablet 150 mg (has no administration in time range)  multivitamin with minerals tablet 1 tablet (1 tablet Oral Given 11/17/20 0926)  famotidine (PEPCID) tablet 20 mg (20 mg Oral Given 11/17/20 0926)  acetaminophen (TYLENOL) tablet 650 mg (650 mg Oral Given 11/17/20 0417)  ondansetron (ZOFRAN) injection 4 mg (has no administration in time range)  enoxaparin (LOVENOX) injection 40 mg (40 mg Subcutaneous Given 11/17/20 0638)  lactated ringers infusion ( Intravenous New Bag/Given 11/17/20 0419)  nirmatrelvir/ritonavir EUA (PAXLOVID) TABS 3 tablet (has no administration in time range)  morphine 2 MG/ML injection 2-4 mg (4 mg Intravenous Given 11/17/20 1037)  morphine 4 MG/ML injection 4 mg (4 mg Intravenous Given 11/16/20 2323)   ondansetron (ZOFRAN) injection 4 mg (4 mg Intravenous Given 11/16/20 2323)  iohexol (OMNIPAQUE) 350 MG/ML injection 100 mL (100 mLs Intravenous Contrast Given 11/17/20 0212)     Note:  This document was prepared using Dragon voice recognition software and may include unintentional dictation errors.  Nanda Quinton, MD, [facility] Emergency Medicine    Keilani Terrance, [facility], MD 11/17/20 1100

## 2020-11-16 NOTE — ED Provider Notes (Signed)
Care assumed from Dr. Laverta Baltimore.  Patient with centralized chest pain going to his mid back for the past 3 to 4 days with shortness of breath.  EKG showed ST elevation in anterior lead which was discussed with cardiology Dr. Ellyn Hack. It was not felt this met STEMI criteria.  Patient states this pain is not typical of his chronic pancreatitis pain.  Chest pain has improved with nitroglycerin.  Troponin is negative.  Lipase minimally elevated.  LFTs and's are stable.  Unclear etiology of patient's pain.  He does have EKG changes and states this pain is not similar to his previous episodes of pancreatitis.  Does have active pancreatitis on CT scan today without evidence of other acute pathology.  No aortic dissection seen.  Plan for observation admission for cardiac rule out.  Discussed with Dr. Lorenza Evangelist, MD 11/17/20 [phone]

## 2020-11-17 ENCOUNTER — Emergency Department (HOSPITAL_COMMUNITY): Payer: Managed Care, Other (non HMO)

## 2020-11-17 DIAGNOSIS — I10 Essential (primary) hypertension: Secondary | ICD-10-CM | POA: Diagnosis not present

## 2020-11-17 DIAGNOSIS — K859 Acute pancreatitis without necrosis or infection, unspecified: Secondary | ICD-10-CM | POA: Diagnosis not present

## 2020-11-17 DIAGNOSIS — R079 Chest pain, unspecified: Secondary | ICD-10-CM | POA: Diagnosis present

## 2020-11-17 DIAGNOSIS — U071 COVID-19: Secondary | ICD-10-CM | POA: Diagnosis not present

## 2020-11-17 DIAGNOSIS — K861 Other chronic pancreatitis: Secondary | ICD-10-CM

## 2020-11-17 LAB — RESP PANEL BY RT-PCR (FLU A&B, COVID) ARPGX2
Influenza A by PCR: NEGATIVE
Influenza B by PCR: NEGATIVE
SARS Coronavirus 2 by RT PCR: POSITIVE — AB

## 2020-11-17 LAB — COMPREHENSIVE METABOLIC PANEL
ALT: 27 U/L (ref 0–44)
AST: 30 U/L (ref 15–41)
Albumin: 3.6 g/dL (ref 3.5–5.0)
Alkaline Phosphatase: 72 U/L (ref 38–126)
Anion gap: 9 (ref 5–15)
BUN: 10 mg/dL (ref 6–20)
CO2: 23 mmol/L (ref 22–32)
Calcium: 9.1 mg/dL (ref 8.9–10.3)
Chloride: 100 mmol/L (ref 98–111)
Creatinine, Ser: 1.15 mg/dL (ref 0.61–1.24)
GFR, Estimated: >60 mL/min (ref >60)
Glucose, Bld: 100 mg/dL — ABNORMAL HIGH (ref 70–99)
Potassium: 4.2 mmol/L (ref 3.5–5.1)
Sodium: 132 mmol/L — ABNORMAL LOW (ref 135–145)
Total Bilirubin: 0.5 mg/dL (ref 0.3–1.2)
Total Protein: 7.1 g/dL (ref 6.5–8.1)

## 2020-11-17 LAB — TROPONIN I (HIGH SENSITIVITY)
Troponin I (High Sensitivity): 3 ng/L (ref <18)
Troponin I (High Sensitivity): 4 ng/L (ref <18)

## 2020-11-17 LAB — LIPASE, BLOOD: Lipase: 59 U/L — ABNORMAL HIGH (ref 11–51)

## 2020-11-17 MED ORDER — IOHEXOL 350 MG/ML SOLN
100.0000 mL | Freq: Once | INTRAVENOUS | Status: AC | PRN
  Administered 2020-11-17: 100 mL via INTRAVENOUS

## 2020-11-17 MED ORDER — MORPHINE SULFATE (PF) 2 MG/ML IV SOLN
2.0000 mg | INTRAVENOUS | Status: DC | PRN
  Administered 2020-11-17 (×3): 4 mg via INTRAVENOUS
  Administered 2020-11-17: 2 mg via INTRAVENOUS
  Administered 2020-11-18 (×3): 4 mg via INTRAVENOUS
  Filled 2020-11-17 (×5): qty 2
  Filled 2020-11-17: qty 1
  Filled 2020-11-17: qty 2

## 2020-11-17 MED ORDER — ACETAMINOPHEN 325 MG PO TABS
650.0000 mg | ORAL_TABLET | ORAL | Status: DC | PRN
  Administered 2020-11-17: 650 mg via ORAL
  Filled 2020-11-17: qty 2

## 2020-11-17 MED ORDER — ADULT MULTIVITAMIN W/MINERALS CH
1.0000 | ORAL_TABLET | Freq: Every day | ORAL | Status: DC
  Administered 2020-11-17 – 2020-11-18 (×2): 1 via ORAL
  Filled 2020-11-17 (×2): qty 1

## 2020-11-17 MED ORDER — NIRMATRELVIR/RITONAVIR (PAXLOVID)TABLET
3.0000 | ORAL_TABLET | Freq: Two times a day (BID) | ORAL | Status: DC
  Administered 2020-11-17 – 2020-11-18 (×2): 3 via ORAL
  Filled 2020-11-17: qty 30

## 2020-11-17 MED ORDER — LABETALOL HCL 5 MG/ML IV SOLN: 5.0000 mg | INTRAVENOUS | Status: DC | PRN

## 2020-11-17 MED ORDER — ENOXAPARIN SODIUM 40 MG/0.4ML IJ SOSY
40.0000 mg | PREFILLED_SYRINGE | INTRAMUSCULAR | Status: DC
  Administered 2020-11-17 – 2020-11-18 (×2): 40 mg via SUBCUTANEOUS
  Filled 2020-11-17 (×2): qty 0.4

## 2020-11-17 MED ORDER — ONDANSETRON HCL 4 MG/2ML IJ SOLN: 4.0000 mg | Freq: Four times a day (QID) | INTRAMUSCULAR | Status: DC | PRN

## 2020-11-17 MED ORDER — FAMOTIDINE 20 MG PO TABS
20.0000 mg | ORAL_TABLET | Freq: Two times a day (BID) | ORAL | Status: DC
  Administered 2020-11-17 – 2020-11-18 (×3): 20 mg via ORAL
  Filled 2020-11-17 (×3): qty 1

## 2020-11-17 MED ORDER — MELATONIN 5 MG PO TABS
5.0000 mg | ORAL_TABLET | Freq: Every day | ORAL | Status: DC
  Administered 2020-11-17: 5 mg via ORAL
  Filled 2020-11-17: qty 1

## 2020-11-17 MED ORDER — IRBESARTAN 150 MG PO TABS
150.0000 mg | ORAL_TABLET | Freq: Every day | ORAL | Status: DC
  Administered 2020-11-18: 150 mg via ORAL
  Filled 2020-11-17: qty 1

## 2020-11-17 MED ORDER — MORPHINE SULFATE (PF) 2 MG/ML IV SOLN: 2.0000 mg | INTRAVENOUS | Status: DC | PRN

## 2020-11-17 MED ORDER — MORPHINE SULFATE (PF) 2 MG/ML IV SOLN
2.0000 mg | INTRAVENOUS | Status: DC | PRN
  Administered 2020-11-17: 2 mg via INTRAVENOUS
  Filled 2020-11-17: qty 1

## 2020-11-17 MED ORDER — BOOST / RESOURCE BREEZE PO LIQD CUSTOM
1.0000 | Freq: Three times a day (TID) | ORAL | Status: DC
  Administered 2020-11-17: 1 via ORAL

## 2020-11-17 MED ORDER — LACTATED RINGERS IV SOLN: INTRAVENOUS | Status: DC

## 2020-11-17 NOTE — Consult Note (Signed)
Cardiology Consultation:   Patient ID: Kyle Berry MRN: [id number]; DOB: October 10, 1970  Admit date: 11/16/2020 Date of Consult: 11/17/2020  PCP:  [name] (Inactive)   [facility] Providers Cardiologist:  None        Patient Profile:   Kyle Berry is a 50 y.o. male with a hx of recurrent acute pancreatitis, hypertension, hyperlipidemia, GERD, congenital single kidney who is being seen 11/17/2020 for the evaluation of chest pain at the request of Dr. Alcario Drought.  History of Present Illness:   Kyle Berry was in his normal state of health until roughly four days ago when he began experiencing mid-sternal chest pain. Describes this as sharp and achy. Mentions that this mainly occurred during the evening times and completely resolved when he's at work. 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 Roughly two days ago he notes the pain significantly worsened in the evening time and his pain began radiating to his back. Notes that this pain is not similar to previous pains from pancreatitis.He also endorses orthopnea and paroxysmal nocturnal dyspnea over the last few days. He denies having these symptoms before. Also denies leg swelling, abdominal pain, nausea or vomiting. Mentions that he is vaccinated against COVID-19 x3 and recently tested negative last week.  Kyle Berry has no significant past cardiac history. Per chart review, no previous Echo. Denies family cardiac history. He does have a history of well-controlled hypertension and is compliant with his medication. He does not take any cholesterol medications, last LDL in chart 90 in 2021. No history of stroke, diabetes, or tobacco use.  In the Emergency Department, troponin negative x2. ECG with mild ST elevations in anterior leads, but felt to be more chronic. CTA chest negative for PE or dissection. Lipase very mildly elevated at 59, which seems low from previous episodes of acute pancreatitis.  Kyle Berry pain did improve with nitroglycerin in the ED.  Past Medical History:  Diagnosis Date   Chronic bronchitis (HCC)    Chronic lower back pain    Congenital single kidney    GERD (gastroesophageal reflux disease)    Headache    "once/month" (05/13/2017)   High cholesterol    Hypertension    Migraine    "a couple/year" (05/13/2017)   Nephrolithiasis 05/13/2017   Pancreatitis    Pneumonia ~ 09/2015   Recurrent acute pancreatitis     Past Surgical History:  Procedure Laterality Date   BIOPSY  11/02/2020   Procedure: BIOPSY;  Surgeon: Irving Copas., MD;  Location: [place];  Service: Gastroenterology;;   CHOLECYSTECTOMY N/A 10/23/2017   Procedure: LAPAROSCOPIC CHOLECYSTECTOMY WITH INTRAOPERATIVE CHOLANGIOGRAM;  Surgeon: Johnathan Hausen, MD;  Location: [facility];  Service: General;  Laterality: N/A;   COLONOSCOPY WITH PROPOFOL N/A 11/02/2019   Procedure: COLONOSCOPY WITH PROPOFOL;  Surgeon: Irene Shipper, MD;  Location: [facility];  Service: Endoscopy;  Laterality: N/A;   ERCP N/A 11/02/2020   Procedure: ENDOSCOPIC RETROGRADE CHOLANGIOPANCREATOGRAPHY (ERCP);  Surgeon: Irving Copas., MD;  Location: [place];  Service: Gastroenterology;  Laterality: N/A;   ESOPHAGOGASTRODUODENOSCOPY N/A 11/02/2020   Procedure: ESOPHAGOGASTRODUODENOSCOPY (EGD);  Surgeon: Irving Copas., MD;  Location: [place];  Service: Gastroenterology;  Laterality: N/A;   ESOPHAGOGASTRODUODENOSCOPY (EGD) WITH PROPOFOL N/A 11/06/2019   Procedure: ESOPHAGOGASTRODUODENOSCOPY (EGD) WITH PROPOFOL;  Surgeon: Irene Shipper, MD;  Location: [facility];  Service: Endoscopy;  Laterality: N/A;   HEMOSTASIS CLIP PLACEMENT  11/02/2019   Procedure: HEMOSTASIS CLIP PLACEMENT;  Surgeon: Irene Shipper,  MD;  Location: [place];  Service: Endoscopy;;   NO PAST SURGERIES     REMOVAL OF STONES  11/02/2020   Procedure: REMOVAL OF STONES;  Surgeon: Rush Landmark Telford Nab., MD;  Location: [place];  Service: Gastroenterology;;   Lavell Islam REMOVAL  11/02/2020   Procedure: STENT REMOVAL;  Surgeon: Irving Copas., MD;  Location: [place];  Service: Gastroenterology;;     Home Medications:  Prior to Admission medications   Medication Sig Start Date End Date Taking? Authorizing Provider  acetaminophen (TYLENOL) 500 MG tablet Take 500 mg by mouth every 6 (six) hours as needed for mild pain.   Yes [provider]  famotidine (PEPCID) 20 MG tablet Take 1 tablet (20 mg total) by mouth 2 (two) times daily. Instructions: Take Pepcid 20 mg twice daily x30 days, then take Pepcid 20 mg daily thereafter. Patient taking differently: Take 20 mg by mouth daily. 11/03/20 05/02/21 Yes Kayleen Memos, DO  lipase/protease/amylase (CREON) 36000 UNITS CPEP capsule Take 2 capsules (72,000 Units total) by mouth 3 (three) times daily with meals. 02/02/20  Yes Mikhail, Hinckley, DO  Multiple Vitamin (MULTIVITAMIN WITH MINERALS) TABS tablet Take 1 tablet by mouth daily. 11/04/20 02/02/21 Yes Kayleen Memos, DO  oxyCODONE-acetaminophen (PERCOCET/ROXICET) 5-325 MG tablet Take 1 tablet by mouth 2 (two) times daily as needed for severe pain.   Yes [provider]  promethazine (PHENERGAN) 25 MG tablet Take 25 mg by mouth every 6 (six) hours as needed for nausea or vomiting.   Yes [provider]  telmisartan (MICARDIS) 40 MG tablet Take 40 mg by mouth daily. 09/26/20  Yes [provider]    Inpatient Medications: Scheduled Meds:  enoxaparin (LOVENOX) injection  40 mg Subcutaneous Q24H   famotidine  20 mg Oral BID   irbesartan  150 mg Oral Daily   multivitamin with minerals  1 tablet Oral Daily   nirmatrelvir/ritonavir EUA  3 tablet Oral BID   Continuous Infusions:  lactated ringers 100 mL/hr at 11/17/20 1142   PRN Meds: acetaminophen, morphine injection, nitroGLYCERIN, ondansetron (ZOFRAN) IV  Allergies:    Allergies  Allergen Reactions   Diclofenac Hives    Norvasc [Amlodipine Besylate] Other (See Comments)    Fluid buildup in chest   Omeprazole Other (See Comments)    MD stopped due to pancreatitis   Prednisone Other (See Comments)    Mood swings    Social History:   Social History   Socioeconomic History   Marital status: Single    Spouse name: Not on file   Number of children: Not on file   Years of education: Not on file   Highest education level: Not on file  Occupational History   Occupation: Engineer, building services  Tobacco Use   Smoking status: Never   Smokeless tobacco: Never  Vaping Use   Vaping Use: Never used  Substance and Sexual Activity   Alcohol use: Not Currently    Comment: 05/13/2017 "might have beer a few times/year"   Drug use: No   Sexual activity: Yes    Partners: Female    Birth control/protection: Condom  Other Topics Concern   Not on file  Social History Narrative   Not on file   Social Determinants of Health   Financial Resource Strain: Not on file  Food Insecurity: Not on file  Transportation Needs: Not on file  Physical Activity: Not on file  Stress: Not on file  Social Connections: Not on file  Intimate Partner Violence: Not  on file    Family History:   No family history of sudden cardiac death. Family History  Problem Relation Age of Onset   Hypertension Other    Hypertension Mother    Hypertension Father    Kidney disease Father    Hypertension Sister    Diabetes Sister    Hypertension Brother    Pancreatic cancer Paternal Grandmother    Colon cancer Cousin    Stomach cancer Neg Hx    Esophageal cancer Neg Hx      ROS:  Please see the history of present illness.   All other ROS reviewed and negative.     Physical Exam/Data:   Vitals:   11/17/20 0900 11/17/20 1032 11/17/20 1033 11/17/20 1140  BP: (!) 152/95  (!) 129/97 (!) 145/92  Pulse: 70  77 70  Resp: '13  18 13  '$ Temp:  98.4 F (36.9 C)    TempSrc:  Oral    SpO2: 97%  98% 96%  Weight:      Height:       No intake  or output data in the 24 hours ending 11/17/20 1226 Last 3 Weights 11/16/2020 10/31/2020 10/30/2020  Weight (lbs) 165 lb 160 lb 7.9 oz 176 lb 5.9 oz  Weight (kg) 74.844 kg 72.8 kg 80 kg     Body mass index is 21.77 kg/m.  General:  Well nourished, well developed, in no acute distress HEENT: normal Lymph: no adenopathy Neck: no JVD Endocrine:  No thryomegaly Vascular: No carotid bruits; FA pulses 2+ bilaterally without bruits  Cardiac:  normal S1, S2; RRR; no murmur  Lungs:  clear to auscultation bilaterally, no wheezing, rhonchi or rales  Abd: soft, nontender, no hepatomegaly  Ext: no edema Musculoskeletal:  No deformities, BUE and BLE strength normal and equal Skin: warm and dry  Neuro:  CNs 2-12 intact, no focal abnormalities noted Psych:  Normal affect   EKG:  The EKG was personally reviewed and demonstrates: Normal sinus rhythm, non-specific ST changes in precordial leads. Telemetry:  Telemetry was personally reviewed and demonstrates:  normal sinus rhythm  Relevant CV Studies: N/a  Laboratory Data:  High Sensitivity Troponin:   Recent Labs  Lab 11/16/20 2218 11/17/20 0047  TROPONINIHS 3 4     Chemistry Recent Labs  Lab 11/16/20 2218  NA 132*  K 4.2  CL 100  CO2 23  GLUCOSE 100*  BUN 10  CREATININE 1.15  CALCIUM 9.1  GFRNONAA >60  ANIONGAP 9    Recent Labs  Lab 11/16/20 2218  PROT 7.1  ALBUMIN 3.6  AST 30  ALT 27  ALKPHOS 72  BILITOT 0.5   Hematology Recent Labs  Lab 11/16/20 2218  WBC 9.1  RBC 3.70*  HGB 12.1*  HCT 36.6*  MCV 98.9  MCH 32.7  MCHC 33.1  RDW 15.2  PLT 299   BNPNo results for input(s): BNP, PROBNP in the last 168 hours.  DDimer No results for input(s): DDIMER in the last 168 hours.   Radiology/Studies:  DG Chest Portable 1 View  Result Date: 11/16/2020 CLINICAL DATA:  Chest pain. EXAM: PORTABLE CHEST 1 VIEW COMPARISON:  Chest radiograph dated 05/29/2020 FINDINGS: Minimal left lung base atelectasis/scarring. No focal  consolidation, pleural effusion, pneumothorax. The cardiac silhouette is within normal limits. No acute osseous pathology. IMPRESSION: No active disease. Electronically Signed   By: Anner Crete M.D.   On: 11/16/2020 23:08   CT Angio Chest/Abd/Pel for Dissection W and/or Wo Contrast  Result Date: 11/17/2020  CLINICAL DATA:  Chest and back pain. Concern for aortic dissection. EXAM: CT ANGIOGRAPHY CHEST, ABDOMEN AND PELVIS TECHNIQUE: Non-contrast CT of the chest was initially obtained. Multidetector CT imaging through the chest, abdomen and pelvis was performed using the standard protocol during bolus administration of intravenous contrast. Multiplanar reconstructed images and MIPs were obtained and reviewed to evaluate the vascular anatomy. CONTRAST:  151m OMNIPAQUE IOHEXOL 350 MG/ML SOLN COMPARISON:  Chest radiograph dated 11/16/2020 and CT abdomen pelvis dated 10/29/2020. FINDINGS: CTA CHEST FINDINGS Cardiovascular: There is no cardiomegaly or pericardial effusion. The thoracic aorta is unremarkable. The origins of the great vessels of the aortic arch appear patent. No pulmonary artery embolus identified. Mediastinum/Nodes: There is mild thickening of the left hilar soft tissues with thickening of the left upper lobe bronchi. Findings likely represent an inflammatory process. An neoplastic process is less likely but not excluded. Clinical correlation and follow-up recommended. No adenopathy. The esophagus and the thyroid gland are grossly unremarkable. No mediastinal fluid collection. Lungs/Pleura: Left upper lobe peribronchial thickening and streaky and hazy densities in the left upper lobe most consistent with an infectious bronchitis or bronchopneumonia. Linear bibasilar atelectasis/scarring. No focal consolidation, pleural effusion, or pneumothorax. The central airways remain patent. Musculoskeletal: No chest wall abnormality. No acute or significant osseous findings. Review of the MIP images confirms  the above findings. CTA ABDOMEN AND PELVIS FINDINGS VASCULAR Aorta: Normal caliber aorta without aneurysm, dissection, vasculitis or significant stenosis. Celiac: Patent without evidence of aneurysm, dissection, vasculitis or significant stenosis. SMA: Patent without evidence of aneurysm, dissection, vasculitis or significant stenosis. Renals: The left renal artery is patent. The right renal artery is diminutive and not identified. IMA: Patent without evidence of aneurysm, dissection, vasculitis or significant stenosis. Inflow: Patent without evidence of aneurysm, dissection, vasculitis or significant stenosis. Apparent faint linear band in the right internal iliac artery, likely scarring. Veins: No obvious venous abnormality within the limitations of this arterial phase study. Review of the MIP images confirms the above findings. NON-VASCULAR Hepatobiliary: The liver is unremarkable. No intrahepatic biliary dilatation. Cholecystectomy. No retained calcified stone in the central CBD. Pancreas: Inflammatory changes of the pancreas consistent with acute pancreatitis. No drainable fluid collection/abscess or pseudocyst. Spleen: Normal in size without focal abnormality. Adrenals/Urinary Tract: The adrenal glands are unremarkable. The right kidney is atrophic. A 5 mm high attenuating structure in the inferior pole of the left kidney may represent a stone or excreted contrast. No hydronephrosis. The left ureter and the urinary bladder appear unremarkable. Stomach/Bowel: There is no bowel obstruction or active inflammation. The appendix is normal. Lymphatic: No adenopathy. Reproductive: The prostate and seminal vesicles are grossly unremarkable. No pelvic mass Other: None Musculoskeletal: Degenerative changes at L5-S1. No acute osseous pathology. Review of the MIP images confirms the above findings. IMPRESSION: 1. No aortic aneurysm or dissection. No pulmonary artery embolus identified. 2. Acute pancreatitis. No drainable  fluid collection/abscess or pseudocyst. 3. Atrophic right kidney. Electronically Signed   By: AAnner CreteM.D.   On: 11/17/2020 02:31     Assessment and Plan:   Acute chest pain Mr. LLevanthas no previous cardiac history with mild modifiable risk factors for coronary artery disease, including age and well-controlled hypertension. His presentation is curious given the association with dyspnea and improvement with nitroglycerin. However, given his chest pain improves with exertion is re-assuring. Physical exam overall benign, no signs of acute heart failure. Work-up thus far unremarkable with negative troponins, non-specific ST changes, and negative CTA. No changes upon review of telemetry.  Most likely chest pain is associated with COVID-19 infection. HEART score 3 with two negative troponins. At this juncture, do not feel further cardiac work-up is warranted. Will need to continue monitoring blood pressure and control modifiable risk factors. If symptoms of orthopnea and PND do not resolve with resolution of COVID-19 infection can consider outpatient Echo. - Paxlovid - Continue with supportive management  2. COVID-19 infection Kyle Berry is vaccinated x3 against COVID-19. He is currently sating well on room air. Paxlovid has been started. - Management per primary  3. Acute on chronic pancreatitis Inflammation appreciated on CT. Patient is not currently endorsing abdominal pain, nausea, or vomiting. Lipase 59, which seems down from previous episodes. Recently had pancreatic duct stent removed with ERCP on 7/20. - Management per primary  4. Hypertension Appears to be well-controlled. Continue with home ARB. - Continue irbesartan '150mg'$  daily  5. Hyperlipidemia Last LDL 05/2019 90, at goal. Currently not on statin. Continue to monitor per PCP.  Please wait for attending's attestation for final recommendations.  Risk Assessment/Risk Scores:       :[id number]   HEAR Score (for  undifferentiated chest pain):  HEAR Score: 3{  For questions or updates, please contact [place] Please consult [email or web address] for contact info under   Signed, Sanjuan Dame, MD  11/17/2020 12:26 PM

## 2020-11-17 NOTE — ED Notes (Signed)
Patient transported to CT 

## 2020-11-17 NOTE — H&P (Signed)
History and Physical    Kyle Berry NWG:[id number] DOB: 1971/02/19 DOA: 11/16/2020  PCP: Cline Crock (Inactive)  Patient coming from: Home  I have personally briefly reviewed patient's old medical records in [facility]  Chief Complaint: CP  HPI: Kyle Berry is a 50 y.o. male with medical history significant of recurrent acute pancreatitis / chronic pancreatitis, HTN, HLD.  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  Had pancreatic stent removed during that admit on 7/20.  Pt presents to ED with central CP for past 3-4 days associated with SOB.  CP radiates to back and L neck.  CP is constant.  Doesn't seem to be affected by exertion.  Pt states pain is not typical of his pancreatitis pain at all.  Denies fever, chills.  Does have cough that "just started".   ED Course: Initially concern for STEMI given ST elevation in anterior lead; however, Dr. Herbie Baltimore felt this was present on prior EKGs and didn't meet criteria for STEMI.  Trops neg x2.  CTA chest neg for PE or dissection.  Does show pancreatitis (read as acute pancreatitis).  Also shows very subtle / mild inflammation in LUL.  Lipase 59.  Pt has tested positive for COVID-19 here in the ED.  He is vaccinated.   Review of Systems: As per HPI, otherwise all review of systems negative.  Past Medical History:  Diagnosis Date   Chronic bronchitis (HCC)    Chronic lower back pain    Congenital single kidney    GERD (gastroesophageal reflux disease)    Headache    "once/month" (05/13/2017)   High cholesterol    Hypertension    Migraine    "a couple/year" (05/13/2017)   Nephrolithiasis 05/13/2017   Pancreatitis    Pneumonia ~ 09/2015   Recurrent acute pancreatitis     Past Surgical History:  Procedure Laterality Date   BIOPSY  11/02/2020   Procedure: BIOPSY;  Surgeon: [name]., MD;  Location: [facility] ENDOSCOPY;  Service: Gastroenterology;;    CHOLECYSTECTOMY N/A 10/23/2017   Procedure: LAPAROSCOPIC CHOLECYSTECTOMY WITH INTRAOPERATIVE CHOLANGIOGRAM;  Surgeon: Luretha Murphy, MD;  Location: [facility] ORS;  Service: General;  Laterality: N/A;   COLONOSCOPY WITH PROPOFOL N/A 11/02/2019   Procedure: COLONOSCOPY WITH PROPOFOL;  Surgeon: Hilarie Fredrickson, MD;  Location: [facility] ENDOSCOPY;  Service: Endoscopy;  Laterality: N/A;   ERCP N/A 11/02/2020   Procedure: ENDOSCOPIC RETROGRADE CHOLANGIOPANCREATOGRAPHY (ERCP);  Surgeon: [name]., MD;  Location: [facility] ENDOSCOPY;  Service: Gastroenterology;  Laterality: N/A;   ESOPHAGOGASTRODUODENOSCOPY N/A 11/02/2020   Procedure: ESOPHAGOGASTRODUODENOSCOPY (EGD);  Surgeon: [name]., MD;  Location: [facility] ENDOSCOPY;  Service: Gastroenterology;  Laterality: N/A;   ESOPHAGOGASTRODUODENOSCOPY (EGD) WITH PROPOFOL N/A 11/06/2019   Procedure: ESOPHAGOGASTRODUODENOSCOPY (EGD) WITH PROPOFOL;  Surgeon: Hilarie Fredrickson, MD;  Location: [facility] ENDOSCOPY;  Service: Endoscopy;  Laterality: N/A;   HEMOSTASIS CLIP PLACEMENT  11/02/2019   Procedure: HEMOSTASIS CLIP PLACEMENT;  Surgeon: Hilarie Fredrickson, MD;  Location[facility] ENDOSCOPY;  Service: Endoscopy;;   NO PAST SURGERIES     REMOVAL OF STONES  11/02/2020   Procedure: REMOVAL OF STONES;  Surgeon: [name]., MD;  Location: [facility] ENDOSCOPY;  Service: Gastroenterology;;   Francine Graven REMOVAL  11/02/2020   Procedure: STENT REMOVAL;  Surgeon: [name]., MD;  Location: [facility] ENDOSCOPY;  Service: Gastroenterology;;     reports that he has never smoked. He has never used smokeless tobacco. He reports previous alcohol use. He reports that he does  not use drugs.  Allergies  Allergen Reactions   Diclofenac Hives   Norvasc [Amlodipine Besylate] Other (See Comments)    Fluid buildup in chest   Omeprazole Other (See Comments)    MD stopped due to pancreatitis   Prednisone Other (See Comments)    Mood swings    Family History  Problem Relation Age of Onset    Hypertension Other    Hypertension Mother    Hypertension Father    Kidney disease Father    Hypertension Sister    Diabetes Sister    Hypertension Brother    Pancreatic cancer Paternal Grandmother    Colon cancer Cousin    Stomach cancer Neg Hx    Esophageal cancer Neg Hx      Prior to Admission medications   Medication Sig Start Date End Date Taking? Authorizing Provider  acetaminophen (TYLENOL) 500 MG tablet Take 500 mg by mouth every 6 (six) hours as needed for mild pain.   Yes [provider]  famotidine (PEPCID) 20 MG tablet Take 1 tablet (20 mg total) by mouth 2 (two) times daily. Instructions: Take Pepcid 20 mg twice daily x30 days, then take Pepcid 20 mg daily thereafter. Patient taking differently: Take 20 mg by mouth daily. 11/03/20 05/02/21 Yes Darlin Drop, DO  lipase/protease/amylase (CREON) 36000 UNITS CPEP capsule Take 2 capsules (72,000 Units total) by mouth 3 (three) times daily with meals. 02/02/20  Yes Mikhail, [place], DO  Multiple Vitamin (MULTIVITAMIN WITH MINERALS) TABS tablet Take 1 tablet by mouth daily. 11/04/20 02/02/21 Yes Darlin Drop, DO  oxyCODONE-acetaminophen (PERCOCET/ROXICET) 5-325 MG tablet Take 1 tablet by mouth 2 (two) times daily as needed for severe pain.   Yes [provider]  promethazine (PHENERGAN) 25 MG tablet Take 25 mg by mouth every 6 (six) hours as needed for nausea or vomiting.   Yes [provider]  telmisartan (MICARDIS) 40 MG tablet Take 40 mg by mouth daily. 09/26/20  Yes [provider]    Physical Exam: Vitals:   11/17/20 0300 11/17/20 0324 11/17/20 0345 11/17/20 0400  BP: (!) 167/109 (!) 144/101 (!) 139/98 (!) 137/95  Pulse: 72 85 93 81  Resp: (!) 23 16 (!) 21 16  Temp:      TempSrc:      SpO2: 96% 97% 97% 95%  Weight:      Height:        Constitutional: NAD, calm, comfortable Eyes: PERRL, lids and conjunctivae normal ENMT: Mucous membranes are moist. Posterior pharynx clear of any  exudate or lesions.Normal dentition.  Neck: normal, supple, no masses, no thyromegaly Respiratory: clear to auscultation bilaterally, no wheezing, no crackles. Normal respiratory effort. No accessory muscle use.  Cardiovascular: Regular rate and rhythm, no murmurs / rubs / gallops. No extremity edema. 2+ pedal pulses. No carotid bruits.  Abdomen: no tenderness, no masses palpated. No hepatosplenomegaly. Bowel sounds positive.  Musculoskeletal: no clubbing / cyanosis. No joint deformity upper and lower extremities. Good ROM, no contractures. Normal muscle tone.  Skin: no rashes, lesions, ulcers. No induration Neurologic: CN 2-12 grossly intact. Sensation intact, DTR normal. Strength 5/5 in all 4.  Psychiatric: Normal judgment and insight. Alert and oriented x 3. Normal mood.    Labs on Admission: I have personally reviewed following labs and imaging studies  CBC: Recent Labs  Lab 11/16/20 2218  WBC 9.1  NEUTROABS 5.2  HGB 12.1*  HCT 36.6*  MCV 98.9  PLT 299   Basic Metabolic Panel: Recent Labs  Lab 11/16/20 2218  NA 132*  K 4.2  CL 100  CO2 23  GLUCOSE 100*  BUN 10  CREATININE 1.15  CALCIUM 9.1   GFR: Estimated Creatinine Clearance: 80.4 mL/min (by C-G formula based on SCr of 1.15 mg/dL). Liver Function Tests: Recent Labs  Lab 11/16/20 2218  AST 30  ALT 27  ALKPHOS 72  BILITOT 0.5  PROT 7.1  ALBUMIN 3.6   Recent Labs  Lab 11/16/20 2218  LIPASE 59*   No results for input(s): AMMONIA in the last 168 hours. Coagulation Profile: No results for input(s): INR, PROTIME in the last 168 hours. Cardiac Enzymes: No results for input(s): CKTOTAL, CKMB, CKMBINDEX, TROPONINI in the last 168 hours. BNP (last 3 results) No results for input(s): PROBNP in the last 8760 hours. HbA1C: No results for input(s): HGBA1C in the last 72 hours. CBG: No results for input(s): GLUCAP in the last 168 hours. Lipid Profile: No results for input(s): CHOL, HDL, LDLCALC, TRIG, CHOLHDL,  LDLDIRECT in the last 72 hours. Thyroid Function Tests: No results for input(s): TSH, T4TOTAL, FREET4, T3FREE, THYROIDAB in the last 72 hours. Anemia Panel: No results for input(s): VITAMINB12, FOLATE, FERRITIN, TIBC, IRON, RETICCTPCT in the last 72 hours. Urine analysis:    Component Value Date/Time   COLORURINE YELLOW 10/31/2020 0404   APPEARANCEUR HAZY (A) 10/31/2020 0404   LABSPEC 1.009 10/31/2020 0404   PHURINE 5.0 10/31/2020 0404   GLUCOSEU NEGATIVE 10/31/2020 0404   HGBUR LARGE (A) 10/31/2020 0404   BILIRUBINUR NEGATIVE 10/31/2020 0404   KETONESUR 5 (A) 10/31/2020 0404   PROTEINUR NEGATIVE 10/31/2020 0404   UROBILINOGEN 0.2 01/20/2015 1643   NITRITE NEGATIVE 10/31/2020 0404   LEUKOCYTESUR NEGATIVE 10/31/2020 0404    Radiological Exams on Admission: DG Chest Portable 1 View  Result Date: 11/16/2020 CLINICAL DATA:  Chest pain. EXAM: PORTABLE CHEST 1 VIEW COMPARISON:  Chest radiograph dated 05/29/2020 FINDINGS: Minimal left lung base atelectasis/scarring. No focal consolidation, pleural effusion, pneumothorax. The cardiac silhouette is within normal limits. No acute osseous pathology. IMPRESSION: No active disease. Electronically Signed   By: Elgie Collard M.D.   On: 11/16/2020 23:08   CT Angio Chest/Abd/Pel for Dissection W and/or Wo Contrast  Result Date: 11/17/2020 CLINICAL DATA:  Chest and back pain. Concern for aortic dissection. EXAM: CT ANGIOGRAPHY CHEST, ABDOMEN AND PELVIS TECHNIQUE: Non-contrast CT of the chest was initially obtained. Multidetector CT imaging through the chest, abdomen and pelvis was performed using the standard protocol during bolus administration of intravenous contrast. Multiplanar reconstructed images and MIPs were obtained and reviewed to evaluate the vascular anatomy. CONTRAST:  OMNIPAQUE IOHEXOL 350 MG/ML SOLN COMPARISON:  Chest radiograph dated 11/16/2020 and CT abdomen pelvis dated 10/29/2020. FINDINGS: CTA CHEST FINDINGS Cardiovascular:  There is no cardiomegaly or pericardial effusion. The thoracic aorta is unremarkable. The origins of the great vessels of the aortic arch appear patent. No pulmonary artery embolus identified. Mediastinum/Nodes: There is mild thickening of the left hilar soft tissues with thickening of the left upper lobe bronchi. Findings likely represent an inflammatory process. An neoplastic process is less likely but not excluded. Clinical correlation and follow-up recommended. No adenopathy. The esophagus and the thyroid gland are grossly unremarkable. No mediastinal fluid collection. Lungs/Pleura: Left upper lobe peribronchial thickening and streaky and hazy densities in the left upper lobe most consistent with an infectious bronchitis or bronchopneumonia. Linear bibasilar atelectasis/scarring. No focal consolidation, pleural effusion, or pneumothorax. The central airways remain patent. Musculoskeletal: No chest wall abnormality. No acute or significant  osseous findings. Review of the MIP images confirms the above findings. CTA ABDOMEN AND PELVIS FINDINGS VASCULAR Aorta: Normal caliber aorta without aneurysm, dissection, vasculitis or significant stenosis. Celiac: Patent without evidence of aneurysm, dissection, vasculitis or significant stenosis. SMA: Patent without evidence of aneurysm, dissection, vasculitis or significant stenosis. Renals: The left renal artery is patent. The right renal artery is diminutive and not identified. IMA: Patent without evidence of aneurysm, dissection, vasculitis or significant stenosis. Inflow: Patent without evidence of aneurysm, dissection, vasculitis or significant stenosis. Apparent faint linear band in the right internal iliac artery, likely scarring. Veins: No obvious venous abnormality within the limitations of this arterial phase study. Review of the MIP images confirms the above findings. NON-VASCULAR Hepatobiliary: The liver is unremarkable. No intrahepatic biliary dilatation.  Cholecystectomy. No retained calcified stone in the central CBD. Pancreas: Inflammatory changes of the pancreas consistent with acute pancreatitis. No drainable fluid collection/abscess or pseudocyst. Spleen: Normal in size without focal abnormality. Adrenals/Urinary Tract: The adrenal glands are unremarkable. The right kidney is atrophic. A 5 mm high attenuating structure in the inferior pole of the left kidney may represent a stone or excreted contrast. No hydronephrosis. The left ureter and the urinary bladder appear unremarkable. Stomach/Bowel: There is no bowel obstruction or active inflammation. The appendix is normal. Lymphatic: No adenopathy. Reproductive: The prostate and seminal vesicles are grossly unremarkable. No pelvic mass Other: None Musculoskeletal: Degenerative changes at L5-S1. No acute osseous pathology. Review of the MIP images confirms the above findings. IMPRESSION: 1. No aortic aneurysm or dissection. No pulmonary artery embolus identified. 2. Acute pancreatitis. No drainable fluid collection/abscess or pseudocyst. 3. Atrophic right kidney. Electronically Signed   By: Elgie Collard M.D.   On: 11/17/2020 02:31    EKG: Independently reviewed.  Assessment/Plan Principal Problem:   Chest pain, rule out acute myocardial infarction Active Problems:   Chronic pancreatitis (HCC)   Essential hypertension   Acute on chronic pancreatitis (HCC)   COVID-19 virus infection    CP - Possibly CP due to COVID-19? No tamponade physiology No trop elevation to suggest myocarditis (though could still have trop neg myocarditis) No pericardial effusion on CT CP obs pathway NPO for the moment Tele monitor Cards eval in AM Morphine PRN pain COVID-19 - Very minimal pulm involvement (mild cough and barely there inflammation in LUL CTA chest, still sating 100% on RA). Given possibly cause of his CP though: will start paxlovid at this time. Acute on chronic pancreatitis - Pancreatic  inflammation seen on today's CT scan. NPO for the moment IVF: LR at 100 Though he says today's symptoms are not typical of his pancreatitis pain / not related. HTN - cont home BP meds  DVT prophylaxis: Lovenox Code Status: Full Family Communication: No family in room Disposition Plan: Home after admit Consults called: None Admission status: Place in 15    Devin Foskey M. DO [facility]  How to contact the [facility] Attending or Consulting provider 7A - 7P or covering provider during after hours 7P -7A, for this patient?  Check the care team in [facility] and look for a) attending/consulting [facility] provider listed and b) the [facility] team listed Log into [email or web address]  Amion Physician Scheduling and messaging for groups and whole hospitals  On call and physician scheduling software for group practices, residents, hospitalists and other medical providers for call, clinic, rotation and shift schedules. On[employer] is a hospital-wide system for scheduling doctors and paging doctors on call. EasyPlot is for scientific plotting and data analysis.  [email or web address]  and use [place] [facility]'s universal password to access. If you do not have the password, please contact the hospital operator.  Locate the [facility] provider you are looking for under [facility] and page to a number that you can be directly reached. If you still have difficulty reaching the provider, please page the [facility] (Director on Call) for the Hospitalists listed on amion for assistance.  11/17/2020, 5:02 AM

## 2020-11-17 NOTE — Progress Notes (Signed)
  Progress note  This is a no charge note as patient was seen and admitted earlier today by Dr. Alcario Drought  With known history of recurrent acute/chronic pancreatitis, hypertension, hyperlipidemia, presented with chest pain, has some atypical features, negative cardiac work-up including troponins x2 and EKG, seen by cardiology, plan for 2D echo, it will not be done till a.m., so patient will be observed overnight.  Physical exam  Awake Alert, Oriented X 3, No new F.N deficits, Normal affect Symmetrical Chest wall movement, Good air movement bilaterally, CTAB RRR,No Gallops,Rubs or new Murmurs, No Parasternal Heave +ve B.Sounds, Abd Soft, No tenderness, No rebound - guarding or rigidity. No Cyanosis, Clubbing or edema, No new Rash or bruise    CP - -Get of troponins, EKG nonacute, currently chest pain-free, plan for echo  COVID-19 - Very minimal pulm involvement (mild cough and barely there inflammation in LUL CTA chest, still sating 100% on RA). Given possibly cause of his CP though: will start paxlovid at this time.  Acute on chronic pancreatitis -        continue with IV fluids, as needed pain medications, will start on clear liquid diet  HTN - cont home BP meds    Phillips Climes MD

## 2020-11-18 ENCOUNTER — Observation Stay (HOSPITAL_BASED_OUTPATIENT_CLINIC_OR_DEPARTMENT_OTHER): Payer: Managed Care, Other (non HMO)

## 2020-11-18 DIAGNOSIS — K861 Other chronic pancreatitis: Secondary | ICD-10-CM | POA: Diagnosis not present

## 2020-11-18 DIAGNOSIS — R079 Chest pain, unspecified: Secondary | ICD-10-CM

## 2020-11-18 LAB — ECHOCARDIOGRAM COMPLETE
AR max vel: 3.52 cm2
AV Area VTI: 3.49 cm2
AV Area mean vel: 3.31 cm2
AV Mean grad: 2 mmHg
AV Peak grad: 3.6 mmHg
Ao pk vel: 0.96 m/s
Area-P 1/2: 2.42 cm2
Height: 73 in
MV VTI: 2.68 cm2
S' Lateral: 3 cm
Weight: 2500.8 oz

## 2020-11-18 LAB — CBC
HCT: 35.2 % — ABNORMAL LOW (ref 39.0–52.0)
Hemoglobin: 12 g/dL — ABNORMAL LOW (ref 13.0–17.0)
MCH: 33.5 pg (ref 26.0–34.0)
MCHC: 34.1 g/dL (ref 30.0–36.0)
MCV: 98.3 fL (ref 80.0–100.0)
Platelets: 252 10*3/uL (ref 150–400)
RBC: 3.58 MIL/uL — ABNORMAL LOW (ref 4.22–5.81)
RDW: 15.2 % (ref 11.5–15.5)
WBC: 9.9 10*3/uL (ref 4.0–10.5)
nRBC: 0 % (ref 0.0–0.2)

## 2020-11-18 LAB — BASIC METABOLIC PANEL
Anion gap: 7 (ref 5–15)
BUN: 6 mg/dL (ref 6–20)
CO2: 29 mmol/L (ref 22–32)
Calcium: 9.5 mg/dL (ref 8.9–10.3)
Chloride: 100 mmol/L (ref 98–111)
Creatinine, Ser: 1.07 mg/dL (ref 0.61–1.24)
GFR, Estimated: >60 mL/min (ref >60)
Glucose, Bld: 105 mg/dL — ABNORMAL HIGH (ref 70–99)
Potassium: 3.6 mmol/L (ref 3.5–5.1)
Sodium: 136 mmol/L (ref 135–145)

## 2020-11-18 MED ORDER — COLCHICINE 0.6 MG PO TABS: 0.6000 mg | ORAL_TABLET | Freq: Once | ORAL | Status: DC

## 2020-11-18 NOTE — Discharge Summary (Signed)
Physician Discharge Summary  Kyle Berry [id number] DOB: 12-Mar-1971 DOA: 11/16/2020  PCP: [name] (Inactive)  Admit date: 11/16/2020 Discharge date: 11/18/2020  Admitted From: Home Disposition:  Home   Recommendations for Outpatient Follow-up:  Follow up with PCP in 1-2 weeks Please obtain BMP/CBC in one week Follow with cardiology as an outpatient  Home Health:NO Equipment/Devices:none  Discharge Condition:Stable CODE STATUS:FULL Diet recommendation: Heart Healthy   Brief/Interim Summary:   CP - -Negative at bedtime troponins, EKG nonacute, currently chest pain-free, 2D echo with a preserved EF, no regional wall motion abnormality, chest pain appears to be fairly nontypical, musculoskeletal/costochondritis, reproducible by palpation, cussed with cardiology, no further work-up as an outpatient, they will schedule follow-up as an outpatient in couple weeks   COVID-19 - -No pneumonia, no hypoxia, no indication for treatment  chronic pancreatitis - -Patient report pain is more chest pain, not typical for his pancreatitis, he is tolerating his diet, no nausea, no vomiting, no abdominal pain    HTN - cont home BP meds    Discharge Diagnoses:  Principal Problem:   Chest pain, rule out acute myocardial infarction Active Problems:   Chronic pancreatitis ([place])   Essential hypertension   Acute on chronic pancreatitis ([place])   COVID-19 virus infection    Discharge Instructions  Discharge Instructions     Diet - low sodium heart healthy   Complete by: As directed    Discharge instructions   Complete by: As directed    Follow with Primary MD [name], [name] (Inactive) in 7 days   Get CBC, CMP,  checked  by Primary MD next visit.    Activity: As tolerated with Full fall precautions use walker/cane & assistance as needed   Disposition Home    Diet: Heart Healthy    On your next visit with your primary care physician please Get Medicines  reviewed and adjusted.   Please request your Prim.MD to go over all Hospital Tests and Procedure/Radiological results at the follow up, please get all Hospital records sent to your Prim MD by signing hospital release before you go home.   If you experience worsening of your admission symptoms, develop shortness of breath, life threatening emergency, suicidal or homicidal thoughts you must seek medical attention immediately by calling 911 or calling your MD immediately  if symptoms less severe.  You Must read complete instructions/literature along with all the possible adverse reactions/side effects for all the Medicines you take and that have been prescribed to you. Take any new Medicines after you have completely understood and accpet all the possible adverse reactions/side effects.   Do not drive, operating heavy machinery, perform activities at heights, swimming or participation in water activities or provide baby sitting services if your were admitted for syncope or siezures until you have seen by Primary MD or a Neurologist and advised to do so again.  Do not drive when taking Pain medications.    Do not take more than prescribed Pain, Sleep and Anxiety Medications  Special Instructions: If you have smoked or chewed Tobacco  in the last 2 yrs please stop smoking, stop any regular Alcohol  and or any Recreational drug use.  Wear Seat belts while driving.   Please note  You were cared for by a hospitalist during your hospital stay. If you have any questions about your discharge medications or the care you received while you were in the hospital after you are discharged, you can call the unit and asked to speak with the  hospitalist on call if the hospitalist that took care of you is not available. Once you are discharged, your primary care physician will handle any further medical issues. Please note that NO REFILLS for any discharge medications will be authorized once you are discharged,  as it is imperative that you return to your primary care physician (or establish a relationship with a primary care physician if you do not have one) for your aftercare needs so that they can reassess your need for medications and monitor your lab values.   Increase activity slowly   Complete by: As directed       Allergies as of 11/18/2020       Reactions   Diclofenac Hives   Norvasc [amlodipine Besylate] Other (See Comments)   Fluid buildup in chest   Omeprazole Other (See Comments)   MD stopped due to pancreatitis   Prednisone Other (See Comments)   Mood swings        Medication List     TAKE these medications    acetaminophen 500 MG tablet Commonly known as: TYLENOL Take 500 mg by mouth every 6 (six) hours as needed for mild pain.   famotidine 20 MG tablet Commonly known as: PEPCID Take 1 tablet (20 mg total) by mouth 2 (two) times daily. Instructions: Take Pepcid 20 mg twice daily x30 days, then take Pepcid 20 mg daily thereafter. What changed:  when to take this additional instructions   lipase/protease/amylase 36000 UNITS Cpep capsule Commonly known as: CREON Take 2 capsules (72,000 Units total) by mouth 3 (three) times daily with meals.   multivitamin with minerals Tabs tablet Take 1 tablet by mouth daily.   oxyCODONE-acetaminophen 5-325 MG tablet Commonly known as: PERCOCET/ROXICET Take 1 tablet by mouth 2 (two) times daily as needed for severe pain.   promethazine 25 MG tablet Commonly known as: PHENERGAN Take 25 mg by mouth every 6 (six) hours as needed for nausea or vomiting.   telmisartan 40 MG tablet Commonly known as: MICARDIS Take 40 mg by mouth daily.        Allergies  Allergen Reactions   Diclofenac Hives   Norvasc [Amlodipine Besylate] Other (See Comments)    Fluid buildup in chest   Omeprazole Other (See Comments)    MD stopped due to pancreatitis   Prednisone Other (See Comments)    Mood swings     Consultations: Cardiology   Procedures/Studies: DG Chest Portable 1 View  Result Date: 11/16/2020 CLINICAL DATA:  Chest pain. EXAM: PORTABLE CHEST 1 VIEW COMPARISON:  Chest radiograph dated 05/29/2020 FINDINGS: Minimal left lung base atelectasis/scarring. No focal consolidation, pleural effusion, pneumothorax. The cardiac silhouette is within normal limits. No acute osseous pathology. IMPRESSION: No active disease. Electronically Signed   By: Anner Crete M.D.   On: 11/16/2020 23:08   DG ERCP BILIARY & PANCREATIC DUCTS  Result Date: 11/02/2020 CLINICAL DATA:  Recurrent pancreatitis, pancreatic duct stent EXAM: ERCP TECHNIQUE: Multiple spot images obtained with the fluoroscopic device and submitted for interpretation post-procedure. FLUOROSCOPY TIME:  Fluoroscopy Time:  1 minutes 11 seconds Number of Acquired Spot Images: 0 COMPARISON:  CT abdomen/pelvis 10/29/2020 FINDINGS: Eight intraoperative spot images are submitted for review. The images demonstrate a flexible duodenal scope in the descending duodenum followed by wire cannulation of the pancreatic duct. The plastic pancreatic stent is removed. IMPRESSION: ERCP with removal of pancreatic stent. These images were submitted for radiologic interpretation only. Please see the procedural report for the amount of contrast and the fluoroscopy time  utilized. Electronically Signed   By: Jacqulynn Cadet M.D.   On: 11/02/2020 15:01   ECHOCARDIOGRAM COMPLETE  Result Date: 11/18/2020    ECHOCARDIOGRAM REPORT   Patient Name:   KEARY MCKISSACK Date of Exam: 11/18/2020 Medical Rec #:  [id number]          Height:       73.0 in Accession #:    [id number]         Weight:       156.3 lb Date of Birth:  May 04, 1970          BSA:          1.938 m Patient Age:    51 years           BP:           144/90 mmHg Patient Gender: M                  HR:           61 bpm. Exam Location:  Inpatient Procedure: 2D Echo, 3D Echo, Cardiac Doppler and Color Doppler  Indications:    Chest pain  History:        Patient has no prior history of Echocardiogram examinations.                 Signs/Symptoms:Chest Pain; Risk Factors:Hypertension and                 Dyslipidemia. COVID-19.  Sonographer:    Clayton Lefort RDCS (AE) Referring Phys: [place]  Sonographer Comments: COVID-19. IMPRESSIONS  1. Left ventricular ejection fraction by 3D volume is 57 %. The left ventricle has normal function. The left ventricle has no regional wall motion abnormalities. There is mild left ventricular hypertrophy. Left ventricular diastolic parameters were normal.  2. Right ventricular systolic function is normal. The right ventricular size is normal.  3. The mitral valve is grossly normal. Trivial mitral valve regurgitation. No evidence of mitral stenosis.  4. The aortic valve is normal in structure. Aortic valve regurgitation is not visualized. No aortic stenosis is present.  5. Aortic dilatation noted. There is borderline dilatation of the ascending aorta, measuring 39 mm. FINDINGS  Left Ventricle: Left ventricular ejection fraction by 3D volume is 57 %. The left ventricle has normal function. The left ventricle has no regional wall motion abnormalities. The left ventricular internal cavity size was normal in size. There is mild left ventricular hypertrophy. Left ventricular diastolic parameters were normal. Right Ventricle: The right ventricular size is normal. Right vetricular wall thickness was not well visualized. Right ventricular systolic function is normal. Left Atrium: Left atrial size was normal in size. Right Atrium: Right atrial size was normal in size. Pericardium: There is no evidence of pericardial effusion. Mitral Valve: The mitral valve is grossly normal. Trivial mitral valve regurgitation. No evidence of mitral valve stenosis. MV peak gradient, 1.8 mmHg. The mean mitral valve gradient is 1.0 mmHg. Tricuspid Valve: The tricuspid valve is grossly normal. Tricuspid valve  regurgitation is trivial. Aortic Valve: The aortic valve is normal in structure. Aortic valve regurgitation is not visualized. No aortic stenosis is present. Aortic valve mean gradient measures 2.0 mmHg. Aortic valve peak gradient measures 3.6 mmHg. Aortic valve area, by VTI measures 3.49 cm. Pulmonic Valve: The pulmonic valve was grossly normal. Pulmonic valve regurgitation is mild to moderate. Aorta: The aortic root and ascending aorta are structurally normal, with no evidence of dilitation and aortic dilatation noted. There  is borderline dilatation of the ascending aorta, measuring 39 mm. IAS/Shunts: The atrial septum is grossly normal.  LEFT VENTRICLE PLAX 2D LVIDd:         4.80 cm         Diastology LVIDs:         3.00 cm         LV e' medial:    9.79 cm/s LV PW:         1.10 cm         LV E/e' medial:  6.2 LV IVS:        1.20 cm         LV e' lateral:   12.90 cm/s LVOT diam:     2.30 cm         LV E/e' lateral: 4.7 LV SV:         69 LV SV Index:   36 LVOT Area:     4.15 cm        3D Volume EF                                LV 3D EF:    Left                                             ventricul                                             ar                                             ejection                                             fraction                                             by 3D                                             volume is                                             57 %.                                 3D Volume EF:                                3D EF:  57 %                                LV EDV:       135 ml                                LV ESV:       58 ml                                LV SV:        77 ml RIGHT VENTRICLE             IVC RV Basal diam:  2.70 cm     IVC diam: 1.50 cm RV S prime:     14.60 cm/s TAPSE (M-mode): 2.0 cm LEFT ATRIUM           Index       RIGHT ATRIUM           Index LA diam:      3.80 cm 1.96 cm/m  RA Area:     17.60 cm LA Vol (A2C): 52.5 ml  27.10 ml/m RA Volume:   44.80 ml  23.12 ml/m LA Vol (A4C): 53.7 ml 27.71 ml/m  AORTIC VALVE AV Area (Vmax):    3.52 cm AV Area (Vmean):   3.31 cm AV Area (VTI):     3.49 cm AV Vmax:           95.50 cm/s AV Vmean:          63.100 cm/s AV VTI:            0.199 m AV Peak Grad:      3.6 mmHg AV Mean Grad:      2.0 mmHg LVOT Vmax:         80.80 cm/s LVOT Vmean:        50.200 cm/s LVOT VTI:          0.167 m LVOT/AV VTI ratio: 0.84  AORTA Ao Root diam: 3.50 cm Ao Asc diam:  3.90 cm MITRAL VALVE MV Area (PHT): 2.42 cm    SHUNTS MV Area VTI:   2.68 cm    Systemic VTI:  0.17 m MV Peak grad:  1.8 mmHg    Systemic Diam: 2.30 cm MV Mean grad:  1.0 mmHg MV Vmax:       0.68 m/s MV Vmean:      44.0 cm/s MV Decel Time: 313 msec MV E velocity: 60.80 cm/s MV A velocity: 49.80 cm/s MV E/A ratio:  1.22 Mertie Moores MD Electronically signed by Mertie Moores MD Signature Date/Time: 11/18/2020/11:38:13 AM    Final    CT Renal Stone Study  Result Date: 10/29/2020 CLINICAL DATA:  Flank pain. Concern for kidney stone. History of pancreatitis. EXAM: CT ABDOMEN AND PELVIS WITHOUT CONTRAST TECHNIQUE: Multidetector CT imaging of the abdomen and pelvis was performed following the standard protocol without IV contrast. COMPARISON:  07/21/2020 FINDINGS: Lower chest: Lung bases are clear. Hepatobiliary: No focal hepatic lesion. Postcholecystectomy. No biliary dilatation. Pancreas: Stent within the pancreatic duct extending to the duodenum. Mild haziness in the peripancreatic fat is similar to comparison exam. There is small amount of fluid along the anterior LEFT peritoneal fascia also similar to comparison exam. (Image 31/4). No organized fluid collections Spleen: Normal  spleen Adrenals/urinary tract: Adrenal glands normal. The RIGHT kidney is severely atrophic. Two nonobstructing calculi in lower pole of the LEFT kidney measuring 6 mm and 5 mm. No LEFT ureterolithiasis or obstructive uropathy. No bladder calculi Stomach/Bowel: Stomach,  small bowel, appendix, and cecum are normal. The colon and rectosigmoid colon are normal. Vascular/Lymphatic: Abdominal aorta is normal caliber. No periportal or retroperitoneal adenopathy. No pelvic adenopathy. Reproductive: Prostate un remark Other: No free fluid. Musculoskeletal: No aggressive osseous lesion. IMPRESSION: 1. Findings consistent with mild acute pancreatitis however findings are very similar to CT 08/10/2020 therefore would also consider chronic pancreatitis or acute on chronic pancreatitis. 2. New pancreatic duct stent. 3. Two nonobstructing LEFT renal calculi. No ureterolithiasis or obstructive uropathy. 4. Severely atrophic RIGHT kidney. Electronically Signed   By: Suzy Bouchard M.D.   On: 10/29/2020 09:31   CT Angio Chest/Abd/Pel for Dissection W and/or Wo Contrast  Result Date: 11/17/2020 CLINICAL DATA:  Chest and back pain. Concern for aortic dissection. EXAM: CT ANGIOGRAPHY CHEST, ABDOMEN AND PELVIS TECHNIQUE: Non-contrast CT of the chest was initially obtained. Multidetector CT imaging through the chest, abdomen and pelvis was performed using the standard protocol during bolus administration of intravenous contrast. Multiplanar reconstructed images and MIPs were obtained and reviewed to evaluate the vascular anatomy. CONTRAST:  186m OMNIPAQUE IOHEXOL 350 MG/ML SOLN COMPARISON:  Chest radiograph dated 11/16/2020 and CT abdomen pelvis dated 10/29/2020. FINDINGS: CTA CHEST FINDINGS Cardiovascular: There is no cardiomegaly or pericardial effusion. The thoracic aorta is unremarkable. The origins of the great vessels of the aortic arch appear patent. No pulmonary artery embolus identified. Mediastinum/Nodes: There is mild thickening of the left hilar soft tissues with thickening of the left upper lobe bronchi. Findings likely represent an inflammatory process. An neoplastic process is less likely but not excluded. Clinical correlation and follow-up recommended. No adenopathy. The esophagus  and the thyroid gland are grossly unremarkable. No mediastinal fluid collection. Lungs/Pleura: Left upper lobe peribronchial thickening and streaky and hazy densities in the left upper lobe most consistent with an infectious bronchitis or bronchopneumonia. Linear bibasilar atelectasis/scarring. No focal consolidation, pleural effusion, or pneumothorax. The central airways remain patent. Musculoskeletal: No chest wall abnormality. No acute or significant osseous findings. Review of the MIP images confirms the above findings. CTA ABDOMEN AND PELVIS FINDINGS VASCULAR Aorta: Normal caliber aorta without aneurysm, dissection, vasculitis or significant stenosis. Celiac: Patent without evidence of aneurysm, dissection, vasculitis or significant stenosis. SMA: Patent without evidence of aneurysm, dissection, vasculitis or significant stenosis. Renals: The left renal artery is patent. The right renal artery is diminutive and not identified. IMA: Patent without evidence of aneurysm, dissection, vasculitis or significant stenosis. Inflow: Patent without evidence of aneurysm, dissection, vasculitis or significant stenosis. Apparent faint linear band in the right internal iliac artery, likely scarring. Veins: No obvious venous abnormality within the limitations of this arterial phase study. Review of the MIP images confirms the above findings. NON-VASCULAR Hepatobiliary: The liver is unremarkable. No intrahepatic biliary dilatation. Cholecystectomy. No retained calcified stone in the central CBD. Pancreas: Inflammatory changes of the pancreas consistent with acute pancreatitis. No drainable fluid collection/abscess or pseudocyst. Spleen: Normal in size without focal abnormality. Adrenals/Urinary Tract: The adrenal glands are unremarkable. The right kidney is atrophic. A 5 mm high attenuating structure in the inferior pole of the left kidney may represent a stone or excreted contrast. No hydronephrosis. The left ureter and the  urinary bladder appear unremarkable. Stomach/Bowel: There is no bowel obstruction or active inflammation. The appendix is normal. Lymphatic:  No adenopathy. Reproductive: The prostate and seminal vesicles are grossly unremarkable. No pelvic mass Other: None Musculoskeletal: Degenerative changes at L5-S1. No acute osseous pathology. Review of the MIP images confirms the above findings. IMPRESSION: 1. No aortic aneurysm or dissection. No pulmonary artery embolus identified. 2. Acute pancreatitis. No drainable fluid collection/abscess or pseudocyst. 3. Atrophic right kidney. Electronically Signed   By: Anner Crete M.D.   On: 11/17/2020 02:31      Subjective:  Significant events overnight as discussed with staff, still complaining of chest pain which appears to be musculoskeletal Discharge Exam: Vitals:   11/17/20 2041 11/18/20 0436  BP: (!) 162/103 (!) 144/90  Pulse: 70 63  Resp:  18  Temp: 97.6 F (36.4 C) 97.7 F (36.5 C)  SpO2: 98% 96%   Vitals:   11/17/20 1445 11/17/20 1543 11/17/20 2041 11/18/20 0436  BP: (!) 161/105 (!) 172/106 (!) 162/103 (!) 144/90  Pulse: 67 70 70 63  Resp: '13 14  18  '$ Temp:  97.7 F (36.5 C) 97.6 F (36.4 C) 97.7 F (36.5 C)  TempSrc:  Oral Oral Oral  SpO2: 98% 98% 98% 96%  Weight:  70.9 kg    Height:  '6\' 1"'$  (1.854 m)      General: Pt is alert, awake, not in acute distress Cardiovascular: RRR, S1/S2 +, no rubs, no gallops Respiratory: CTA bilaterally, no wheezing, no rhonchi Abdominal: Soft, NT, ND, bowel sounds + Extremities: no edema, no cyanosis    The results of significant diagnostics from this hospitalization (including imaging, microbiology, ancillary and laboratory) are listed below for reference.     Microbiology: Recent Results (from the past 240 hour(s))  Resp Panel by RT-PCR (Flu A&B, Covid) Nasopharyngeal Swab     Status: Abnormal   Collection Time: 11/17/20  3:13 AM   Specimen: Nasopharyngeal Swab; Nasopharyngeal(NP) swabs in  vial transport medium  Result Value Ref Range Status   SARS Coronavirus 2 by RT PCR POSITIVE (A) NEGATIVE Final    Comment: RESULT CALLED TO, READ BACK BY AND VERIFIED WITH: BROWN,RN'@0418'$  11/17/20 MKELLY (NOTE) SARS-CoV-2 target nucleic acids are DETECTED.  The SARS-CoV-2 RNA is generally detectable in upper respiratory specimens during the acute phase of infection. Positive results are indicative of the presence of the identified virus, but do not rule out bacterial infection or co-infection with other pathogens not detected by the test. Clinical correlation with patient history and other diagnostic information is necessary to determine patient infection status. The expected result is Negative.  Fact Sheet for Patients: EntrepreneurPulse.com.au  Fact Sheet for Healthcare Providers: IncredibleEmployment.be  This test is not yet approved or cleared by the [place] FDA and  has been authorized for detection and/or diagnosis of SARS-CoV-2 by FDA under an Emergency Use Authorization (EUA).  This EUA will remain in effect (meaning this test can be use d) for the duration of  the COVID-19 declaration under Section 564(b)(1) of the Act, 21 U.S.C. section 360bbb-3(b)(1), unless the authorization is terminated or revoked sooner.     Influenza A by PCR NEGATIVE NEGATIVE Final   Influenza B by PCR NEGATIVE NEGATIVE Final    Comment: (NOTE) The Xpert Xpress SARS-CoV-2/FLU/RSV plus assay is intended as an aid in the diagnosis of influenza from Nasopharyngeal swab specimens and should not be used as a sole basis for treatment. Nasal washings and aspirates are unacceptable for Xpert Xpress SARS-CoV-2/FLU/RSV testing.  Fact Sheet for Patients: EntrepreneurPulse.com.au  Fact Sheet for Healthcare Providers: IncredibleEmployment.be  This test is not yet  approved or cleared by the [place] and has been  authorized for detection and/or diagnosis of SARS-CoV-2 by FDA under an Emergency Use Authorization (EUA). This EUA will remain in effect (meaning this test can be used) for the duration of the COVID-19 declaration under Section 564(b)(1) of the Act, 21 U.S.C. section 360bbb-3(b)(1), unless the authorization is terminated or revoked.  Performed at [facility], [place] [address] [place], [place] [address]      Labs: BNP (last 3 results) No results for input(s): BNP in the last 8760 hours. Basic Metabolic Panel: Recent Labs  Lab 11/16/20 2218 11/18/20 0232  NA 132* 136  K 4.2 3.6  CL 100 100  CO2 23 29  GLUCOSE 100* 105*  BUN 10 6  CREATININE 1.15 1.07  CALCIUM 9.1 9.5   Liver Function Tests: Recent Labs  Lab 11/16/20 2218  AST 30  ALT 27  ALKPHOS 72  BILITOT 0.5  PROT 7.1  ALBUMIN 3.6   Recent Labs  Lab 11/16/20 2218  LIPASE 59*   No results for input(s): AMMONIA in the last 168 hours. CBC: Recent Labs  Lab 11/16/20 2218 11/18/20 0232  WBC 9.1 9.9  NEUTROABS 5.2  --   HGB 12.1* 12.0*  HCT 36.6* 35.2*  MCV 98.9 98.3  PLT 299 252   Cardiac Enzymes: No results for input(s): CKTOTAL, CKMB, CKMBINDEX, TROPONINI in the last 168 hours. BNP: Invalid input(s): POCBNP CBG: No results for input(s): GLUCAP in the last 168 hours. D-Dimer No results for input(s): DDIMER in the last 72 hours. Hgb A1c No results for input(s): HGBA1C in the last 72 hours. Lipid Profile No results for input(s): CHOL, HDL, LDLCALC, TRIG, CHOLHDL, LDLDIRECT in the last 72 hours. Thyroid function studies No results for input(s): TSH, T4TOTAL, T3FREE, THYROIDAB in the last 72 hours.  Invalid input(s): FREET3 Anemia work up No results for input(s): VITAMINB12, FOLATE, FERRITIN, TIBC, IRON, RETICCTPCT in the last 72 hours. Urinalysis    Component Value Date/Time   COLORURINE YELLOW 10/31/2020 0404   APPEARANCEUR HAZY (A) 10/31/2020 0404   LABSPEC 1.009 10/31/2020 0404    PHURINE 5.0 10/31/2020 0404   GLUCOSEU NEGATIVE 10/31/2020 0404   HGBUR LARGE (A) 10/31/2020 0404   BILIRUBINUR NEGATIVE 10/31/2020 0404   KETONESUR 5 (A) 10/31/2020 0404   PROTEINUR NEGATIVE 10/31/2020 0404   UROBILINOGEN 0.2 01/20/2015 1643   NITRITE NEGATIVE 10/31/2020 0404   LEUKOCYTESUR NEGATIVE 10/31/2020 0404   Sepsis Labs Invalid input(s): PROCALCITONIN,  WBC,  LACTICIDVEN Microbiology Recent Results (from the past 240 hour(s))  Resp Panel by RT-PCR (Flu A&B, Covid) Nasopharyngeal Swab     Status: Abnormal   Collection Time: 11/17/20  3:13 AM   Specimen: Nasopharyngeal Swab; Nasopharyngeal(NP) swabs in vial transport medium  Result Value Ref Range Status   SARS Coronavirus 2 by RT PCR POSITIVE (A) NEGATIVE Final    Comment: RESULT CALLED TO, READ BACK BY AND VERIFIED WITH: BROWN,RN'@0418'$  11/17/20 MKELLY (NOTE) SARS-CoV-2 target nucleic acids are DETECTED.  The SARS-CoV-2 RNA is generally detectable in upper respiratory specimens during the acute phase of infection. Positive results are indicative of the presence of the identified virus, but do not rule out bacterial infection or co-infection with other pathogens not detected by the test. Clinical correlation with patient history and other diagnostic information is necessary to determine patient infection status. The expected result is Negative.  Fact Sheet for Patients: EntrepreneurPulse.com.au  Fact Sheet for Healthcare Providers: IncredibleEmployment.be  This test is not yet  approved or cleared by the [place] and  has been authorized for detection and/or diagnosis of SARS-CoV-2 by FDA under an Emergency Use Authorization (EUA).  This EUA will remain in effect (meaning this test can be use d) for the duration of  the COVID-19 declaration under Section 564(b)(1) of the Act, 21 U.S.C. section 360bbb-3(b)(1), unless the authorization is terminated or revoked sooner.      Influenza A by PCR NEGATIVE NEGATIVE Final   Influenza B by PCR NEGATIVE NEGATIVE Final    Comment: (NOTE) The Xpert Xpress SARS-CoV-2/FLU/RSV plus assay is intended as an aid in the diagnosis of influenza from Nasopharyngeal swab specimens and should not be used as a sole basis for treatment. Nasal washings and aspirates are unacceptable for Xpert Xpress SARS-CoV-2/FLU/RSV testing.  Fact Sheet for Patients: EntrepreneurPulse.com.au  Fact Sheet for Healthcare Providers: IncredibleEmployment.be  This test is not yet approved or cleared by the [place] FDA and has been authorized for detection and/or diagnosis of SARS-CoV-2 by FDA under an Emergency Use Authorization (EUA). This EUA will remain in effect (meaning this test can be used) for the duration of the COVID-19 declaration under Section 564(b)(1) of the Act, 21 U.S.C. section 360bbb-3(b)(1), unless the authorization is terminated or revoked.  Performed at [facility], [place] [address], [place], [place] [address]      Time coordinating discharge:30 minutes  SIGNED:   Phillips Climes, MD  [facility] 11/18/2020, 2:35 PM Pager   If 7PM-7AM, please contact night-coverage [email or web address]

## 2020-11-18 NOTE — Progress Notes (Signed)
  Echocardiogram 2D Echocardiogram has been performed.  Kyle Berry 11/18/2020, 9:35 AM

## 2020-11-18 NOTE — Plan of Care (Signed)
  Problem: Education: Goal: Knowledge of General Education information will improve Description: Including pain rating scale, medication(s)/side effects and non-pharmacologic comfort measures Outcome: Adequate for Discharge   Problem: Health Behavior/Discharge Planning: Goal: Ability to manage health-related needs will improve Outcome: Adequate for Discharge   Problem: Clinical Measurements: Goal: Ability to maintain clinical measurements within normal limits will improve Outcome: Adequate for Discharge Goal: Will remain free from infection Outcome: Adequate for Discharge Goal: Diagnostic test results will improve Outcome: Adequate for Discharge Goal: Respiratory complications will improve Outcome: Adequate for Discharge Goal: Cardiovascular complication will be avoided Outcome: Adequate for Discharge   Problem: Activity: Goal: Risk for activity intolerance will decrease Outcome: Adequate for Discharge   Problem: Nutrition: Goal: Adequate nutrition will be maintained Outcome: Adequate for Discharge   Problem: Coping: Goal: Level of anxiety will decrease Outcome: Adequate for Discharge   Problem: Elimination: Goal: Will not experience complications related to bowel motility Outcome: Adequate for Discharge Goal: Will not experience complications related to urinary retention Outcome: Adequate for Discharge   Problem: Pain Managment: Goal: General experience of comfort will improve Outcome: Adequate for Discharge   Problem: Safety: Goal: Ability to remain free from injury will improve Outcome: Adequate for Discharge   Problem: Skin Integrity: Goal: Risk for impaired skin integrity will decrease Outcome: Adequate for Discharge   Problem: Education: Goal: Knowledge of risk factors and measures for prevention of condition will improve Outcome: Adequate for Discharge   Problem: Coping: Goal: Psychosocial and spiritual needs will be supported Outcome: Adequate for  Discharge   Problem: Respiratory: Goal: Will maintain a patent airway Outcome: Adequate for Discharge Goal: Complications related to the disease process, condition or treatment will be avoided or minimized Outcome: Adequate for Discharge   Problem: Education: Goal: Understanding of cardiac disease, CV risk reduction, and recovery process will improve Outcome: Adequate for Discharge Goal: Individualized Educational Video(s) Outcome: Adequate for Discharge   Problem: Activity: Goal: Ability to tolerate increased activity will improve Outcome: Adequate for Discharge   Problem: Cardiac: Goal: Ability to achieve and maintain adequate cardiovascular perfusion will improve Outcome: Adequate for Discharge   Problem: Health Behavior/Discharge Planning: Goal: Ability to safely manage health-related needs after discharge will improve Outcome: Adequate for Discharge

## 2020-11-18 NOTE — Discharge Instructions (Signed)
Follow with Primary MD [name], [name] (Inactive) in 7 days   Get CBC, CMP,  checked  by Primary MD next visit.    Activity: As tolerated with Full fall precautions use walker/cane & assistance as needed   Disposition Home    Diet: Heart Healthy    On your next visit with your primary care physician please Get Medicines reviewed and adjusted.   Please request your Prim.MD to go over all Hospital Tests and Procedure/Radiological results at the follow up, please get all Hospital records sent to your Prim MD by signing hospital release before you go home.   If you experience worsening of your admission symptoms, develop shortness of breath, life threatening emergency, suicidal or homicidal thoughts you must seek medical attention immediately by calling 911 or calling your MD immediately  if symptoms less severe.  You Must read complete instructions/literature along with all the possible adverse reactions/side effects for all the Medicines you take and that have been prescribed to you. Take any new Medicines after you have completely understood and accpet all the possible adverse reactions/side effects.   Do not drive, operating heavy machinery, perform activities at heights, swimming or participation in water activities or provide baby sitting services if your were admitted for syncope or siezures until you have seen by Primary MD or a Neurologist and advised to do so again.  Do not drive when taking Pain medications.    Do not take more than prescribed Pain, Sleep and Anxiety Medications  Special Instructions: If you have smoked or chewed Tobacco  in the last 2 yrs please stop smoking, stop any regular Alcohol  and or any Recreational drug use.  Wear Seat belts while driving.   Please note  You were cared for by a hospitalist during your hospital stay. If you have any questions about your discharge medications or the care you received while you were in the hospital after you  are discharged, you can call the unit and asked to speak with the hospitalist on call if the hospitalist that took care of you is not available. Once you are discharged, your primary care physician will handle any further medical issues. Please note that NO REFILLS for any discharge medications will be authorized once you are discharged, as it is imperative that you return to your primary care physician (or establish a relationship with a primary care physician if you do not have one) for your aftercare needs so that they can reassess your need for medications and monitor your lab values.

## 2020-12-11 NOTE — Progress Notes (Deleted)
Cardiology Office Note   Date:  12/11/2020   ID:  Kyle Berry, DOB 1970-09-25, MRN [id number]  PCP:  [name] (Inactive)  Cardiologist:  Skeet Latch, MD EP: None  No chief complaint on file.     History of Present Illness: Kyle Berry is a 50 y.o. male with a PMH of HTN, HLD, chronic pancreatitis, GERD, and congenital single kidney, who presents for post-hospital follow-up.  He was recently admitted to the hospital from 11/16/20-11/18/20 with complaints of chest pain, incidentally found to have COVID-19. 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 CT Chest was reviewed by Dr. ROval Linseyand showed no evidecne of coronary artery calcifications. He was noted to be fairly active at baseline and without exertional chest pain complaints. He was recommended to follow-up outpatient to consider ischemic testing if symptoms persisted.   He presents today for post-hospital follow-up.  1. Chest pain: no recurrence***. EKG today non-ischemic - No further work-up at this time  2. HTN: BP *** today - Continue tlmisartan  3. HLD: LDL 90 05/2019; at goal of <100 for patients without CAD - Continue dietary/lifestyle modifications to promote lower cholesterol  4. Solitary kidney: congenital. Cr. 1.07 11/18/20 - Continue routine monitoring    Past Medical History:  Diagnosis Date   Chronic bronchitis (HCC)    Chronic lower back pain    Congenital single kidney    GERD (gastroesophageal reflux disease)    Headache    "once/month" (05/13/2017)   High cholesterol    Hypertension    Migraine    "a couple/year" (05/13/2017)   Nephrolithiasis 05/13/2017   Pancreatitis    Pneumonia ~ 09/2015   Recurrent acute pancreatitis     Past Surgical History:  Procedure Laterality Date   BIOPSY   11/02/2020   Procedure: BIOPSY;  Surgeon: MIrving Copas, MD;  [facility] [facility]  Service: Gastroenterology;;   CHOLECYSTECTOMY N/A 10/23/2017   Procedure: LAPAROSCOPIC CHOLECYSTECTOMY WITH INTRAOPERATIVE CHOLANGIOGRAM;  Surgeon: MJohnathan Hausen MD;  Location: [facility];  Service: General;  Laterality: N/A;   COLONOSCOPY WITH PROPOFOL N/A 11/02/2019   Procedure: COLONOSCOPY WITH PROPOFOL;  Surgeon: PIrene Shipper MD;  [facility];  Service: Endoscopy;  Laterality: N/A;   ERCP N/A 11/02/2020   Procedure: ENDOSCOPIC RETROGRADE CHOLANGIOPANCREATOGRAPHY (ERCP);  Surgeon: MIrving Copas, MD;  [place] [facility]  Service: Gastroenterology;  Laterality: N/A;   ESOPHAGOGASTRODUODENOSCOPY N/A 11/02/2020   Procedure: ESOPHAGOGASTRODUODENOSCOPY (EGD);  Surgeon: MIrving Copas, MD;  [facility]  Service: [facility];  Laterality: N/A;   ESOPHAGOGASTRODUODENOSCOPY (EGD) WITH PROPOFOL N/A 11/06/2019   Procedure: ESOPHAGOGASTRODUODENOSCOPY (EGD) WITH PROPOFOL;  Surgeon: PIrene Shipper MD;  [facility]  Service: Endoscopy;  Laterality: N/A;   HEMOSTASIS CLIP PLACEMENT  11/02/2019   Procedure: HEMOSTASIS CLIP PLACEMENT;  Surgeon: PIrene Shipper MD;  [facility];  Service: Endoscopy;;   NO PAST SURGERIES     REMOVAL OF STONES  11/02/2020   Procedure: REMOVAL OF STONES;  Surgeon: MIrving Copas, MD;  [place]  Service: [place]ology;;   SLavell IslamREMOVAL  11/02/2020   Procedure: STENT REMOVAL;  Surgeon: MIrving Copas, MD;  [place] [facility]  Service: Gastroenterology;;     Current Outpatient Medications  Medication Sig Dispense Refill   acetaminophen (TYLENOL) 500 MG tablet Take 500 mg by  mouth every 6 (six) hours as needed for mild pain.     famotidine (PEPCID) 20 MG tablet Take 1 tablet (20 mg total) by mouth 2 (two) times daily. Instructions: Take Pepcid 20 mg twice daily x30 days, then take  Pepcid 20 mg daily thereafter. 360 tablet 0   lipase/protease/amylase (CREON) 36000 UNITS CPEP capsule Take 2 capsules (72,000 Units total) by mouth 3 (three) times daily with meals. 180 capsule 0   Multiple Vitamin (MULTIVITAMIN WITH MINERALS) TABS tablet Take 1 tablet by mouth daily. 90 tablet 0   oxyCODONE-acetaminophen (PERCOCET/ROXICET) 5-325 MG tablet Take 1 tablet by mouth 2 (two) times daily as needed for severe pain.     promethazine (PHENERGAN) 25 MG tablet Take 25 mg by mouth every 6 (six) hours as needed for nausea or vomiting.     telmisartan (MICARDIS) 40 MG tablet Take 40 mg by mouth daily.     No current facility-administered medications for this visit.    Allergies:   Diclofenac, Norvasc [amlodipine besylate], Omeprazole, and Prednisone    Social History:  The patient  reports that he has never smoked. He has never used smokeless tobacco. He reports that he does not currently use alcohol. He reports that he does not use drugs.   Family History:  The patient's ***family history includes Colon cancer in his cousin; Diabetes in his sister; Hypertension in his brother, father, mother, sister, and another family member; Kidney disease in his father; Pancreatic cancer in his paternal grandmother.    ROS:  Please see the history of present illness.   Otherwise, review of systems are positive for {NONE DEFAULTED:18576}.   All other systems are reviewed and negative.    PHYSICAL EXAM: VS:  There were no vitals taken for this visit. , BMI There is no height or weight on file to calculate BMI. GEN: Well nourished, well developed, in no acute distress HEENT: normal Neck: no JVD, carotid bruits, or masses Cardiac: ***RRR; no murmurs, rubs, or gallops,no edema  Respiratory:  clear to auscultation bilaterally, normal work of breathing GI: soft, nontender, nondistended, + BS MS: no deformity or atrophy Skin: warm and dry, no rash Neuro:  Strength and sensation are intact Psych:  euthymic mood, full affect   EKG:  EKG {ACTION; IS/IS [id number] ordered today. The ekg ordered today demonstrates ***   Recent Labs: 10/31/2020: Magnesium 1.8 11/16/2020: ALT 27 11/18/2020: BUN 6; Creatinine, Ser 1.07; Hemoglobin 12.0; Platelets 252; Potassium 3.6; Sodium 136    Lipid Panel    Component Value Date/Time   CHOL 159 05/29/2019 0553   TRIG 32 08/21/2019 0639   HDL 56 05/29/2019 0553   CHOLHDL 2.8 05/29/2019 0553   VLDL 13 05/29/2019 0553   LDLCALC 90 05/29/2019 0553      Wt Readings from Last 3 Encounters:  11/17/20 156 lb 4.8 oz (70.9 kg)  10/31/20 160 lb 7.9 oz (72.8 kg)  10/29/20 180 lb 12.4 oz (82 kg)      Other studies Reviewed: Additional studies/ records that were reviewed today include:   Echocardiogram 11/18/20: 1. Left ventricular ejection fraction by 3D volume is 57 %. The left  ventricle has normal function. The left ventricle has no regional wall  motion abnormalities. There is mild left ventricular hypertrophy. Left  ventricular diastolic parameters were  normal.   2. Right ventricular systolic function is normal. The right ventricular  size is normal.   3. The mitral valve is grossly normal. Trivial mitral valve  regurgitation. No evidence  of mitral stenosis.   4. The aortic valve is normal in structure. Aortic valve regurgitation is  not visualized. No aortic stenosis is present.   5. Aortic dilatation noted. There is borderline dilatation of the  ascending aorta, measuring 39 mm.     ASSESSMENT AND PLAN:  1.  ***   Current medicines are reviewed at length with the patient today.  The patient {ACTIONS; HAS/DOES NOT HAVE:19233} concerns regarding medicines.  The following changes have been made:  {PLAN; NO CHANGE:13088:s}  Labs/ tests ordered today include: *** No orders of the defined types were placed in this encounter.    Disposition:   FU with *** in {gen number [id number] {Days to years:10300}  Signed, Abigail Butts, PA-C  12/11/2020 10:01 PM

## 2020-12-12 ENCOUNTER — Ambulatory Visit: Payer: Managed Care, Other (non HMO) | Admitting: Medical

## 2020-12-12 DIAGNOSIS — R079 Chest pain, unspecified: Secondary | ICD-10-CM

## 2020-12-12 DIAGNOSIS — I10 Essential (primary) hypertension: Secondary | ICD-10-CM

## 2020-12-12 DIAGNOSIS — E785 Hyperlipidemia, unspecified: Secondary | ICD-10-CM

## 2020-12-12 DIAGNOSIS — Q600 Renal agenesis, unilateral: Secondary | ICD-10-CM

## 2020-12-12 NOTE — Progress Notes (Deleted)
Cardiology Clinic Note   Patient Name: Kyle Berry Date of Encounter: 12/12/2020  Primary Care Provider:  [name] (Inactive) Primary Cardiologist:  Skeet Latch, MD  Patient Profile     Kyle Berry presents the clinic today for follow-up evaluation of his chest discomfort.  Past Medical History    Past Medical History:  Diagnosis Date   Chronic bronchitis ([place])    Chronic lower back pain    Congenital single kidney    GERD (gastroesophageal reflux disease)    Headache    "once/month" (05/13/2017)   High cholesterol    Hypertension    Migraine    "a couple/year" (05/13/2017)   Nephrolithiasis 05/13/2017   Pancreatitis    Pneumonia ~ 09/2015   Recurrent acute pancreatitis    Past Surgical History:  Procedure Laterality Date   BIOPSY  11/02/2020   Procedure: BIOPSY;  Surgeon: Irving Copas., MD;  Location: [place];  Service: Gastroenterology;;   CHOLECYSTECTOMY N/A 10/23/2017   Procedure: LAPAROSCOPIC CHOLECYSTECTOMY WITH INTRAOPERATIVE CHOLANGIOGRAM;  Surgeon: Johnathan Hausen, MD;  Location: [facility];  Service: General;  Laterality: N/A;   COLONOSCOPY WITH PROPOFOL N/A 11/02/2019   Procedure: COLONOSCOPY WITH PROPOFOL;  Surgeon: Irene Shipper, MD;  Location: [facility] ENDOSCOPY;  Service: Endoscopy;  Laterality: N/A;   ERCP N/A 11/02/2020   Procedure: ENDOSCOPIC RETROGRADE CHOLANGIOPANCREATOGRAPHY (ERCP);  Surgeon: Irving Copas., MD;  Location: [place];  Service: Gastroenterology;  Laterality: N/A;   ESOPHAGOGASTRODUODENOSCOPY N/A 11/02/2020   Procedure: ESOPHAGOGASTRODUODENOSCOPY (EGD);  Surgeon: Irving Copas., MD;  Location: [place];  Service: Gastroenterology;  Laterality: N/A;   ESOPHAGOGASTRODUODENOSCOPY (EGD) WITH PROPOFOL N/A 11/06/2019   Procedure: ESOPHAGOGASTRODUODENOSCOPY (EGD) WITH PROPOFOL;  Surgeon: Irene Shipper, MD;  Location: [facility] ENDOSCOPY;  Service: Endoscopy;  Laterality: N/A;   HEMOSTASIS CLIP  PLACEMENT  11/02/2019   Procedure: HEMOSTASIS CLIP PLACEMENT;  Surgeon: Irene Shipper, MD;  Location: [facility] ENDOSCOPY;  Service: Endoscopy;;   NO PAST SURGERIES     REMOVAL OF STONES  11/02/2020   Procedure: REMOVAL OF STONES;  Surgeon: Rush Landmark Telford Nab., MD;  Location: [place];  Service: Gastroenterology;;   Lavell Islam REMOVAL  11/02/2020   Procedure: STENT REMOVAL;  Surgeon: Irving Copas., MD;  Location: [place];  Service: Gastroenterology;;    Allergies  Allergies  Allergen Reactions   Diclofenac Hives   Norvasc [Amlodipine Besylate] Other (See Comments)    Fluid buildup in chest   Omeprazole Other (See Comments)    MD stopped due to pancreatitis   Prednisone Other (See Comments)    Mood swings    History of Present Illness     Kyle Berry has a PMH of hypertension, hyperlipidemia, chronic pancreatitis, GERD, and congenital single kidney.  He was admitted to the hospital on 12/06/2020 until 11/18/2020.  He presented with complaints of chest discomfort and was found to have COVID-19.  His echocardiogram at that time showed an EF of 57%, normal LV diastolic function, mild LVH, and no significant valvular normalities.  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  A chest CT showed no evidence of coronary artery calcifications.  He reported that he at baseline was fairly physically active and did not have chest discomfort.  Outpatient ischemic evaluation was recommended if his symptoms persisted.  He presents the clinic today for follow-up evaluation states***  *** denies chest pain, shortness of breath, lower extremity edema, fatigue, palpitations, melena, hematuria, hemoptysis, diaphoresis, weakness, presyncope, syncope,  orthopnea, and PND.     Home Medications    Prior to Admission medications   Medication Sig Start Date End Date Taking? Authorizing Provider  acetaminophen (TYLENOL) 500 MG tablet Take 500 mg by mouth every 6 (six) hours as  needed for mild pain.    [provider]  famotidine (PEPCID) 20 MG tablet Take 1 tablet (20 mg total) by mouth 2 (two) times daily. Instructions: Take Pepcid 20 mg twice daily x30 days, then take Pepcid 20 mg daily thereafter. 11/03/20 05/02/21  Kayleen Memos, DO  lipase/protease/amylase (CREON) 36000 UNITS CPEP capsule Take 2 capsules (72,000 Units total) by mouth 3 (three) times daily with meals. 02/02/20   Cristal Ford, DO  Multiple Vitamin (MULTIVITAMIN WITH MINERALS) TABS tablet Take 1 tablet by mouth daily. 11/04/20 02/02/21  Kayleen Memos, DO  oxyCODONE-acetaminophen (PERCOCET/ROXICET) 5-325 MG tablet Take 1 tablet by mouth 2 (two) times daily as needed for severe pain.    [provider]  promethazine (PHENERGAN) 25 MG tablet Take 25 mg by mouth every 6 (six) hours as needed for nausea or vomiting.    [provider]  telmisartan (MICARDIS) 40 MG tablet Take 40 mg by mouth daily. 09/26/20   [provider]    Family History    Family History  Problem Relation Age of Onset   Hypertension Other    Hypertension Mother    Hypertension Father    Kidney disease Father    Hypertension Sister    Diabetes Sister    Hypertension Brother    Pancreatic cancer Paternal Grandmother    Colon cancer Cousin    Stomach cancer Neg Hx    Esophageal cancer Neg Hx    He indicated that his mother is alive. He indicated that his father is alive. He indicated that his sister is alive. He indicated that both of his brothers are alive. He indicated that the status of his paternal grandmother is unknown. He indicated that the status of his cousin is unknown. He indicated that the status of his neg hx is unknown. He indicated that the status of his other is unknown.  Social History    Social History   Socioeconomic History   Marital status: Single    Spouse name: Not on file   Number of children: Not on file   Years of education: Not on file   Highest education  level: Not on file  Occupational History   Occupation: Engineer, building services  Tobacco Use   Smoking status: Never   Smokeless tobacco: Never  Vaping Use   Vaping Use: Never used  Substance and Sexual Activity   Alcohol use: Not Currently    Comment: 05/13/2017 "might have beer a few times/year"   Drug use: No   Sexual activity: Yes    Partners: Female    Birth control/protection: Condom  Other Topics Concern   Not on file  Social History Narrative   Not on file   Social Determinants of Health   Financial Resource Strain: Not on file  Food Insecurity: Not on file  Transportation Needs: Not on file  Physical Activity: Not on file  Stress: Not on file  Social Connections: Not on file  Intimate Partner Violence: Not on file     Review of Systems    General:  No chills, fever, night sweats or weight changes.  Cardiovascular:  No chest pain, dyspnea on exertion, edema, orthopnea, palpitations, paroxysmal nocturnal dyspnea. Dermatological: No rash, lesions/masses Respiratory: No cough,  dyspnea Urologic: No hematuria, dysuria Abdominal:   No nausea, vomiting, diarrhea, bright red blood per rectum, melena, or hematemesis Neurologic:  No visual changes, wkns, changes in mental status. All other systems reviewed and are otherwise negative except as noted above.  Physical Exam    VS:  There were no vitals taken for this visit. , BMI There is no height or weight on file to calculate BMI. GEN: Well nourished, well developed, in no acute distress. HEENT: normal. Neck: Supple, no JVD, carotid bruits, or masses. Cardiac: RRR, no murmurs, rubs, or gallops. No clubbing, cyanosis, edema.  Radials/DP/PT 2+ and equal bilaterally.  Respiratory:  Respirations regular and unlabored, clear to auscultation bilaterally. GI: Soft, nontender, nondistended, BS + x 4. MS: no deformity or atrophy. Skin: warm and dry, no rash. Neuro:  Strength and sensation are intact. Psych: Normal affect.  Accessory  Clinical Findings    Recent Labs: 10/31/2020: Magnesium 1.8 11/16/2020: ALT 27 11/18/2020: BUN 6; Creatinine, Ser 1.07; Hemoglobin 12.0; Platelets 252; Potassium 3.6; Sodium 136   Recent Lipid Panel    Component Value Date/Time   CHOL 159 05/29/2019 0553   TRIG 32 08/21/2019 0639   HDL 56 05/29/2019 0553   CHOLHDL 2.8 05/29/2019 0553   VLDL 13 05/29/2019 0553   LDLCALC 90 05/29/2019 0553    ECG personally reviewed by me today- *** - No acute changes  Echocardiogram 11/18/2020  IMPRESSIONS     1. Left ventricular ejection fraction by 3D volume is 57 %. The left  ventricle has normal function. The left ventricle has no regional wall  motion abnormalities. There is mild left ventricular hypertrophy. Left  ventricular diastolic parameters were  normal.   2. Right ventricular systolic function is normal. The right ventricular  size is normal.   3. The mitral valve is grossly normal. Trivial mitral valve  regurgitation. No evidence of mitral stenosis.   4. The aortic valve is normal in structure. Aortic valve regurgitation is  not visualized. No aortic stenosis is present.   5. Aortic dilatation noted. There is borderline dilatation of the  ascending aorta, measuring 39 mm.   FINDINGS   Left Ventricle: Left ventricular ejection fraction by 3D volume is 57 %.  The left ventricle has normal function. The left ventricle has no regional  wall motion abnormalities. The left ventricular internal cavity size was  normal in size. There is mild  left ventricular hypertrophy. Left ventricular diastolic parameters were  normal.   Right Ventricle: The right ventricular size is normal. Right vetricular  wall thickness was not well visualized. Right ventricular systolic  function is normal.   Left Atrium: Left atrial size was normal in size.   Right Atrium: Right atrial size was normal in size.   Pericardium: There is no evidence of pericardial effusion.   Mitral Valve: The mitral valve  is grossly normal. Trivial mitral valve  regurgitation. No evidence of mitral valve stenosis. MV peak gradient, 1.8  mmHg. The mean mitral valve gradient is 1.0 mmHg.   Tricuspid Valve: The tricuspid valve is grossly normal. Tricuspid valve  regurgitation is trivial.   Aortic Valve: The aortic valve is normal in structure. Aortic valve  regurgitation is not visualized. No aortic stenosis is present. Aortic  valve mean gradient measures 2.0 mmHg. Aortic valve peak gradient measures  3.6 mmHg. Aortic valve area, by VTI  measures 3.49 cm.   Pulmonic Valve: The pulmonic valve was grossly normal. Pulmonic valve  regurgitation is mild to moderate.  Aorta: The aortic root and ascending aorta are structurally normal, with  no evidence of dilitation and aortic dilatation noted. There is borderline  dilatation of the ascending aorta, measuring 39 mm.   IAS/Shunts: The atrial septum is grossly normal.  Assessment & Plan    Chest pain: Denies recurrence of arm neck back or chest discomfort.  Recovering well from COVID-19.  EKG today shows*** - No further work-up at this time   HTN: BP *** today.  Well-controlled at home. Continue telmisartan Heart healthy low-sodium diet-salty 6 given Increase physical activity as tolerated   HLD: LDL 90 05/2019; at goal of <100 for patients without CAD - Continue dietary/lifestyle modifications to promote lower cholesterol  Continue*** Heart healthy low-sodium high-fiber diet Increase physical activity as tolerated  Solitary kidney: congenital. Cr. 1.07 11/18/20 - Continue routine monitoring   Disposition: Follow-up with Dr. Oval Linsey in 3-4 months.  Jossie Ng. Laramie Gelles NP-C    12/12/2020, 1:40 PM [facility] [place] [address] Office [phone] Fax [phone]  Notice: This dictation was prepared with Dragon dictation along with smaller phrase technology. Any transcriptional errors that result from this process  are unintentional and may not be corrected upon review.  I spent***minutes examining this patient, reviewing medications, and using patient centered shared decision making involving her cardiac care.  Prior to her visit I spent greater than 20 minutes reviewing her past medical history,  medications, and prior cardiac tests.

## 2020-12-13 ENCOUNTER — Ambulatory Visit (HOSPITAL_BASED_OUTPATIENT_CLINIC_OR_DEPARTMENT_OTHER): Payer: Managed Care, Other (non HMO) | Admitting: General Practice

## 2020-12-14 ENCOUNTER — Other Ambulatory Visit: Payer: Self-pay

## 2020-12-14 ENCOUNTER — Ambulatory Visit (INDEPENDENT_AMBULATORY_CARE_PROVIDER_SITE_OTHER): Payer: Managed Care, Other (non HMO) | Admitting: Physician Assistant

## 2020-12-14 ENCOUNTER — Encounter: Payer: Self-pay | Admitting: Physician Assistant

## 2020-12-14 VITALS — BP 118/76 | HR 100 | Ht 73.0 in | Wt 165.6 lb

## 2020-12-14 DIAGNOSIS — I10 Essential (primary) hypertension: Secondary | ICD-10-CM

## 2020-12-14 DIAGNOSIS — E785 Hyperlipidemia, unspecified: Secondary | ICD-10-CM | POA: Diagnosis not present

## 2020-12-14 DIAGNOSIS — K859 Acute pancreatitis without necrosis or infection, unspecified: Secondary | ICD-10-CM | POA: Diagnosis not present

## 2020-12-14 DIAGNOSIS — R072 Precordial pain: Secondary | ICD-10-CM

## 2020-12-14 DIAGNOSIS — Q600 Renal agenesis, unilateral: Secondary | ICD-10-CM

## 2020-12-14 NOTE — Patient Instructions (Signed)
  Testing/Procedures:  Your physician has requested that you have a lexiscan myoview. For further information please visit [email or web address]. Please follow instruction sheet, as given. [place]   Follow-Up: At [facility], you and your health needs are our priority.  As part of our continuing mission to provide you with exceptional heart care, we have created designated Provider Care Teams.  These Care Teams include your primary Cardiologist (physician) and Advanced Practice Providers (APPs -  Physician Assistants and Nurse Practitioners) who all work together to provide you with the care you need, when you need it.  We recommend signing up for the patient portal called "MyChart".  Sign up information is provided on this After Visit Summary.  MyChart is used to connect with patients for Virtual Visits (Telemedicine).  Patients are able to view lab/test results, encounter notes, upcoming appointments, etc.  Non-urgent messages can be sent to your provider as well.   To learn more about what you can do with MyChart, go to [email or web address].    Your next appointment:   6 month(s)  The format for your next appointment:   In Person  Provider:   Skeet Latch MD

## 2020-12-14 NOTE — Progress Notes (Signed)
Cardiology Office Note:    Date:  12/16/2020   ID:  [name], DOB 06/23/1970, MRN [id number]  PCP:  [name] (Inactive)   [facility] Providers Cardiologist:  Skeet Latch, MD     Referring MD: No ref. provider found   Chief Complaint  Patient presents with   Follow-up    Seen for Dr. Oval Linsey    History of Present Illness:    VINCIENT SCHIRRA is a 50 y.o. male with a hx of hypertension, hyperlipidemia, recurrent pancreatitis, congenital solitary kidney, CKD stage II and history of kidney stone.  Patient was admitted in July 2022 with recurrent idiopathic pancreatitis.  Prior to that, he had a pancreatic stent placed at [facility].  He also had episode of hematemesis prior to arrival as well.  He was seen by GI service and underwent endoscopy and ERCP by Dr. Rush Landmark.  The previous biliary sphincterotomy and pancreatic sphincterotomy appeared to be open.  Pancreatic stones were found and removed through the minor duct.  Patient was discharged on as needed dose of Percocet.  More recently, patient presented to the hospital again on 11/17/2020 with 3 to 4 days of chest pain associated with shortness of breath.  The symptom was not typical of his pancreatic pain.  Initial concern for ST elevation in the anterior leads and however this was reviewed by the STEMI doc who felt EKG did not meet criteria for STEMI.  Serial troponin was negative x2.  CTA was negative for PE or dissection, it does show pancreatitis.  Patient tested positive for COVID-19 incidentally.  Lipase was 59.  Cardiology service was consulted.  Patient was seen by resident along with Dr. Oval Linsey on 11/17/2020.  Dr. Oval Linsey reviewed the chest CT image which did not show any coronary calcification.  Patient also denied any exertional chest pain even though he has a very active job.  Echocardiogram obtained on 11/18/2020 showed EF 57%, mild LVH, trivial MR, borderline dilatation of the ascending aorta measuring at  39 mm.  Overall this is a normal echocardiogram.  Patient was eventually discharged from the hospital with potential consideration for outpatient ischemic work-up if symptom recurs.  Patient presents today for follow-up.  He continues to have intermittent chest discomfort.  He also describes orthopnea and PND episode recently.  However on physical exam, he is clinically euvolemic.  His lung is crystal clear from the base to the top on both side.  I recommended proceeding with a nuclear stress test.  He would not be a very good candidate for coronary CT as his baseline heart rate has been elevated and will be difficult to slow the heart rate down.  I suspect that her elevated heart rate at baseline is related to recurrent pancreatitis.  If the nuclear stress test is negative, would not recommend any further work-up and he can follow-up with Dr. Oval Linsey in 6 months.   Past Medical History:  Diagnosis Date   Chronic bronchitis (HCC)    Chronic lower back pain    Congenital single kidney    GERD (gastroesophageal reflux disease)    Headache    "once/month" (05/13/2017)   High cholesterol    Hypertension    Migraine    "a couple/year" (05/13/2017)   Nephrolithiasis 05/13/2017   Pancreatitis    Pneumonia ~ 09/2015   Recurrent acute pancreatitis     Past Surgical History:  Procedure Laterality Date   BIOPSY  11/02/2020   Procedure: BIOPSY;  Surgeon: Irving Copas., MD;  Location: [facility];  Service: Gastroenterology;;   CHOLECYSTECTOMY N/A 10/23/2017   Procedure: LAPAROSCOPIC CHOLECYSTECTOMY WITH INTRAOPERATIVE CHOLANGIOGRAM;  Surgeon: Johnathan Hausen, MD;  Location: [facility];  Service: General;  Laterality: N/A;   COLONOSCOPY WITH PROPOFOL N/A 11/02/2019   Procedure: COLONOSCOPY WITH PROPOFOL;  Surgeon: Irene Shipper, MD;  Location: [facility];  Service: Endoscopy;  Laterality: N/A;   ERCP N/A 11/02/2020   Procedure: ENDOSCOPIC RETROGRADE CHOLANGIOPANCREATOGRAPHY (ERCP);  Surgeon:  Irving Copas., MD;  Location: [place];  Service: Gastroenterology;  Laterality: N/A;   ESOPHAGOGASTRODUODENOSCOPY N/A 11/02/2020   Procedure: ESOPHAGOGASTRODUODENOSCOPY (EGD);  Surgeon: Irving Copas., MD;  Location: [place];  Service: Gastroenterology;  Laterality: N/A;   ESOPHAGOGASTRODUODENOSCOPY (EGD) WITH PROPOFOL N/A 11/06/2019   Procedure: ESOPHAGOGASTRODUODENOSCOPY (EGD) WITH PROPOFOL;  Surgeon: Irene Shipper, MD;  Location: [facility];  Service: Endoscopy;  Laterality: N/A;   HEMOSTASIS CLIP PLACEMENT  11/02/2019   Procedure: HEMOSTASIS CLIP PLACEMENT;  Surgeon: Irene Shipper, MD;  Location: [facility] END[facility]COPY;  Service: Endoscopy;;   NO PAST SURGERIES     REMOVAL OF STONES  11/02/2020   Procedure: REMOVAL OF STONES;  Surgeon: Irving Copas., MD;  Location: [place];  Service: Gastroenterology;;   Lavell Islam REMOVAL  11/02/2020   Procedure: STENT REMOVAL;  Surgeon: Irving Copas., MD;  Location: [place];  Service: Gastroenterology;;    Current Medications: Current Meds  Medication Sig   famotidine (PEPCID) 20 MG tablet Take 1 tablet (20 mg total) by mouth 2 (two) times daily. Instructions: Take Pepcid 20 mg twice daily x30 days, then take Pepcid 20 mg daily thereafter.   lipase/protease/amylase (CREON) 36000 UNITS CPEP capsule Take 2 capsules (72,000 Units total) by mouth 3 (three) times daily with meals.   Multiple Vitamin (MULTIVITAMIN WITH MINERALS) TABS tablet Take 1 tablet by mouth daily.   oxyCODONE-acetaminophen (PERCOCET/ROXICET) 5-325 MG tablet Take 1 tablet by mouth 2 (two) times daily as needed for severe pain.   promethazine (PHENERGAN) 25 MG tablet Take 25 mg by mouth every 6 (six) hours as needed for nausea or vomiting.   telmisartan (MICARDIS) 40 MG tablet Take 40 mg by mouth daily.     Allergies:   Diclofenac, Norvasc [amlodipine besylate], Omeprazole, and Prednisone   Social History   Socioeconomic History   Marital  status: Single    Spouse name: Not on file   Number of children: Not on file   Years of education: Not on file   Highest education level: Not on file  Occupational History   Occupation: Engineer, building services  Tobacco Use   Smoking status: Never   Smokeless tobacco: Never  Vaping Use   Vaping Use: Never used  Substance and Sexual Activity   Alcohol use: Not Currently    Comment: 05/13/2017 "might have beer a few times/year"   Drug use: No   Sexual activity: Yes    Partners: Female    Birth control/protection: Condom  Other Topics Concern   Not on file  Social History Narrative   Not on file   Social Determinants of Health   Financial Resource Strain: Not on file  Food Insecurity: Not on file  Transportation Needs: Not on file  Physical Activity: Not on file  Stress: Not on file  Social Connections: Not on file     Family History: The patient's family history includes Colon cancer in his cousin; Diabetes in his sister; Hypertension in his brother, father, mother, sister, and another family member; Kidney disease in his father;  Pancreatic cancer in his paternal grandmother. There is no history of Stomach cancer or Esophageal cancer.  ROS:   Please see the history of present illness.     All other systems reviewed and are negative.  EKGs/Labs/Other Studies Reviewed:    The following studies were reviewed today:  Echo 11/18/2020  1. Left ventricular ejection fraction by 3D volume is 57 %. The left  ventricle has normal function. The left ventricle has no regional wall  motion abnormalities. There is mild left ventricular hypertrophy. Left  ventricular diastolic parameters were  normal.   2. Right ventricular systolic function is normal. The right ventricular  size is normal.   3. The mitral valve is grossly normal. Trivial mitral valve  regurgitation. No evidence of mitral stenosis.   4. The aortic valve is normal in structure. Aortic valve regurgitation is  not visualized.  No aortic stenosis is present.   5. Aortic dilatation noted. There is borderline dilatation of the  ascending aorta, measuring 39 mm.   EKG:  EKG is ordered today.  The ekg ordered today demonstrates sinus tachycardia, with heart rate 100 bpm.  Recent Labs: 10/31/2020: Magnesium 1.8 11/16/2020: ALT 27 11/18/2020: BUN 6; Creatinine, Ser 1.07; Hemoglobin 12.0; Platelets 252; Potassium 3.6; Sodium 136  Recent Lipid Panel    Component Value Date/Time   CHOL 159 05/29/2019 0553   TRIG 32 08/21/2019 0639   HDL 56 05/29/2019 0553   CHOLHDL 2.8 05/29/2019 0553   VLDL 13 05/29/2019 0553   LDLCALC 90 05/29/2019 0553     Risk Assessment/Calculations:           Physical Exam:    VS:  BP 118/76   Pulse 100   Ht '6\' 1"'$  (1.854 m)   Wt 165 lb 9.6 oz (75.1 kg)   SpO2 99%   BMI 21.85 kg/m     Wt Readings from Last 3 Encounters:  12/14/20 165 lb 9.6 oz (75.1 kg)  11/17/20 156 lb 4.8 oz (70.9 kg)  10/31/20 160 lb 7.9 oz (72.8 kg)     GEN:  Well nourished, well developed in no acute distress HEENT: Normal NECK: No JVD; No carotid bruits LYMPHATICS: No lymphadenopathy CARDIAC: RRR, no murmurs, rubs, gallops RESPIRATORY:  Clear to auscultation without rales, wheezing or rhonchi  ABDOMEN: Soft, non-tender, non-distended MUSCULOSKELETAL:  No edema; No deformity  SKIN: Warm and dry NEUROLOGIC:  Alert and oriented x 3 PSYCHIATRIC:  Normal affect   ASSESSMENT:    1. Precordial pain   2. Primary hypertension   3. Hyperlipidemia LDL goal <100   4. Recurrent pancreatitis   5. Congenital single kidney    PLAN:    In order of problems listed above:  Precordial chest pain: Symptom is somewhat atypical.  We will proceed with nuclear stress test.  If negative, no further work-up is needed.  Not a good candidate for coronary CT due to difficulty slowing down the heart rate  Hypertension: Blood pressure stable  Hyperlipidemia: Diet and exercise  Recurrent pancreatitis: Followed at  [facility]  Congenital solitary kidney: Followed by nephrology service.     Medication Adjustments/Labs and Tests Ordered: Current medicines are reviewed at length with the patient today.  Concerns regarding medicines are outlined above.  Orders Placed This Encounter  Procedures   Cardiac Stress Test: Informed Consent Details: Physician/Practitioner Attestation; Transcribe to consent form and obtain patient signature   MYOCARDIAL PERFUSION IMAGING   EKG 12-Lead   No orders of the defined types were placed in  this encounter.   Patient Instructions   Testing/Procedures:  Your physician has requested that you have a lexiscan myoview. For further information please visit [email or web address]. Please follow instruction sheet, as given. Bureau   Follow-Up: At [facility], you and your health needs are our priority.  As part of our continuing mission to provide you with exceptional heart care, we have created designated Provider Care Teams.  These Care Teams include your primary Cardiologist (physician) and Advanced Practice Providers (APPs -  Physician Assistants and Nurse Practitioners) who all work together to provide you with the care you need, when you need it.  We recommend signing up for the patient portal called "MyChart".  Sign up information is provided on this After Visit Summary.  MyChart is used to connect with patients for Virtual Visits (Telemedicine).  Patients are able to view lab/test results, encounter notes, upcoming appointments, etc.  Non-urgent messages can be sent to your provider as well.   To learn more about what you can do with MyChart, go to [email or web address].    Your next appointment:   6 month(s)  The format for your next appointment:   In Person  Provider:   [facility] MD     Signed, Almyra Deforest, [place]  12/16/2020 11:56 AM    [place]

## 2020-12-15 ENCOUNTER — Telehealth (HOSPITAL_COMMUNITY): Payer: Self-pay

## 2020-12-15 NOTE — Telephone Encounter (Signed)
Detailed instructions left on the patient's answering machine. Asked to call back with any questions. S.Tracy Kinner EMTP 

## 2020-12-16 ENCOUNTER — Encounter: Payer: Self-pay | Admitting: Physician Assistant

## 2020-12-20 ENCOUNTER — Encounter (HOSPITAL_COMMUNITY): Payer: Managed Care, Other (non HMO)

## 2020-12-20 ENCOUNTER — Telehealth (HOSPITAL_COMMUNITY): Payer: Self-pay | Admitting: *Deleted

## 2020-12-20 NOTE — Telephone Encounter (Signed)
Left message on voicemail in reference to upcoming appointment scheduled for 12/26/20. Phone number given for a call back so details instructions can be given. Kyle Berry, [name]

## 2020-12-23 ENCOUNTER — Other Ambulatory Visit: Payer: Self-pay

## 2020-12-23 ENCOUNTER — Emergency Department (HOSPITAL_COMMUNITY): Payer: Managed Care, Other (non HMO)

## 2020-12-23 ENCOUNTER — Inpatient Hospital Stay (HOSPITAL_COMMUNITY)
Admission: EM | Admit: 2020-12-23 | Discharge: 2020-12-31 | DRG: 439 | Disposition: A | Discharge status: Home / Self Care | Payer: Managed Care, Other (non HMO) | Attending: Internal Medicine | Admitting: Internal Medicine

## 2020-12-23 ENCOUNTER — Encounter (HOSPITAL_COMMUNITY): Payer: Self-pay

## 2020-12-23 DIAGNOSIS — R079 Chest pain, unspecified: Secondary | ICD-10-CM | POA: Diagnosis not present

## 2020-12-23 DIAGNOSIS — Q453 Other congenital malformations of pancreas and pancreatic duct: Secondary | ICD-10-CM

## 2020-12-23 DIAGNOSIS — K859 Acute pancreatitis without necrosis or infection, unspecified: Secondary | ICD-10-CM | POA: Diagnosis not present

## 2020-12-23 DIAGNOSIS — K861 Other chronic pancreatitis: Secondary | ICD-10-CM | POA: Diagnosis present

## 2020-12-23 DIAGNOSIS — R112 Nausea with vomiting, unspecified: Secondary | ICD-10-CM

## 2020-12-23 DIAGNOSIS — Z841 Family history of disorders of kidney and ureter: Secondary | ICD-10-CM

## 2020-12-23 DIAGNOSIS — R1010 Upper abdominal pain, unspecified: Secondary | ICD-10-CM

## 2020-12-23 DIAGNOSIS — G8929 Other chronic pain: Secondary | ICD-10-CM | POA: Diagnosis present

## 2020-12-23 DIAGNOSIS — K219 Gastro-esophageal reflux disease without esophagitis: Secondary | ICD-10-CM | POA: Diagnosis present

## 2020-12-23 DIAGNOSIS — Z800 Family history of malignant neoplasm of digestive organs: Secondary | ICD-10-CM

## 2020-12-23 DIAGNOSIS — K3184 Gastroparesis: Secondary | ICD-10-CM | POA: Diagnosis present

## 2020-12-23 DIAGNOSIS — Z8616 Personal history of COVID-19: Secondary | ICD-10-CM

## 2020-12-23 DIAGNOSIS — E7800 Pure hypercholesterolemia, unspecified: Secondary | ICD-10-CM | POA: Diagnosis present

## 2020-12-23 DIAGNOSIS — E440 Moderate protein-calorie malnutrition: Secondary | ICD-10-CM | POA: Insufficient documentation

## 2020-12-23 DIAGNOSIS — I129 Hypertensive chronic kidney disease with stage 1 through stage 4 chronic kidney disease, or unspecified chronic kidney disease: Secondary | ICD-10-CM | POA: Diagnosis present

## 2020-12-23 DIAGNOSIS — Z833 Family history of diabetes mellitus: Secondary | ICD-10-CM

## 2020-12-23 DIAGNOSIS — E876 Hypokalemia: Secondary | ICD-10-CM | POA: Diagnosis present

## 2020-12-23 DIAGNOSIS — M545 Low back pain, unspecified: Secondary | ICD-10-CM | POA: Diagnosis present

## 2020-12-23 DIAGNOSIS — I10 Essential (primary) hypertension: Secondary | ICD-10-CM | POA: Diagnosis present

## 2020-12-23 DIAGNOSIS — Z9049 Acquired absence of other specified parts of digestive tract: Secondary | ICD-10-CM

## 2020-12-23 DIAGNOSIS — Z8249 Family history of ischemic heart disease and other diseases of the circulatory system: Secondary | ICD-10-CM

## 2020-12-23 DIAGNOSIS — Z79899 Other long term (current) drug therapy: Secondary | ICD-10-CM

## 2020-12-23 DIAGNOSIS — Z888 Allergy status to other drugs, medicaments and biological substances status: Secondary | ICD-10-CM

## 2020-12-23 HISTORY — DX: Other chronic pancreatitis: K86.1

## 2020-12-23 LAB — BRAIN NATRIURETIC PEPTIDE: B Natriuretic Peptide: 53.1 pg/mL (ref 0.0–100.0)

## 2020-12-23 LAB — HEPATIC FUNCTION PANEL
ALT: 31 U/L (ref 0–44)
AST: 47 U/L — ABNORMAL HIGH (ref 15–41)
Albumin: 4 g/dL (ref 3.5–5.0)
Alkaline Phosphatase: 75 U/L (ref 38–126)
Bilirubin, Direct: 0.2 mg/dL (ref 0.0–0.2)
Indirect Bilirubin: 0.6 mg/dL (ref 0.3–0.9)
Total Bilirubin: 0.8 mg/dL (ref 0.3–1.2)
Total Protein: 7.8 g/dL (ref 6.5–8.1)

## 2020-12-23 LAB — BASIC METABOLIC PANEL
Anion gap: 14 (ref 5–15)
BUN: 11 mg/dL (ref 6–20)
CO2: 20 mmol/L — ABNORMAL LOW (ref 22–32)
Calcium: 9.2 mg/dL (ref 8.9–10.3)
Chloride: 100 mmol/L (ref 98–111)
Creatinine, Ser: 1.06 mg/dL (ref 0.61–1.24)
GFR, Estimated: >60 mL/min (ref >60)
Glucose, Bld: 112 mg/dL — ABNORMAL HIGH (ref 70–99)
Potassium: 3.6 mmol/L (ref 3.5–5.1)
Sodium: 134 mmol/L — ABNORMAL LOW (ref 135–145)

## 2020-12-23 LAB — CBC
HCT: 39.9 % (ref 39.0–52.0)
Hemoglobin: 13.6 g/dL (ref 13.0–17.0)
MCH: 33.2 pg (ref 26.0–34.0)
MCHC: 34.1 g/dL (ref 30.0–36.0)
MCV: 97.3 fL (ref 80.0–100.0)
Platelets: 198 10*3/uL (ref 150–400)
RBC: 4.1 MIL/uL — ABNORMAL LOW (ref 4.22–5.81)
RDW: 14.6 % (ref 11.5–15.5)
WBC: 6.5 10*3/uL (ref 4.0–10.5)
nRBC: 0 % (ref 0.0–0.2)

## 2020-12-23 LAB — TROPONIN I (HIGH SENSITIVITY)
Troponin I (High Sensitivity): 4 ng/L (ref <18)
Troponin I (High Sensitivity): 5 ng/L (ref <18)

## 2020-12-23 LAB — SARS CORONAVIRUS 2 (TAT 6-24 HRS): SARS Coronavirus 2: NEGATIVE

## 2020-12-23 LAB — LIPASE, BLOOD: Lipase: 102 U/L — ABNORMAL HIGH (ref 11–51)

## 2020-12-23 LAB — D-DIMER, QUANTITATIVE: D-Dimer, Quant: 0.41 ug/mL-FEU (ref 0.00–0.50)

## 2020-12-23 MED ORDER — PROCHLORPERAZINE EDISYLATE 10 MG/2ML IJ SOLN
10.0000 mg | Freq: Once | INTRAMUSCULAR | Status: AC
  Administered 2020-12-23: 10 mg via INTRAMUSCULAR
  Filled 2020-12-23: qty 2

## 2020-12-23 MED ORDER — HYDROMORPHONE HCL 1 MG/ML IJ SOLN
2.0000 mg | Freq: Once | INTRAMUSCULAR | Status: AC
  Administered 2020-12-23: 2 mg via INTRAMUSCULAR
  Filled 2020-12-23: qty 2

## 2020-12-23 MED ORDER — IOHEXOL 350 MG/ML SOLN
80.0000 mL | Freq: Once | INTRAVENOUS | Status: AC | PRN
  Administered 2020-12-23: 80 mL via INTRAVENOUS

## 2020-12-23 MED ORDER — ONDANSETRON HCL 4 MG PO TABS
4.0000 mg | ORAL_TABLET | Freq: Four times a day (QID) | ORAL | Status: DC | PRN
  Administered 2020-12-24: 4 mg via ORAL
  Filled 2020-12-23: qty 1

## 2020-12-23 MED ORDER — MORPHINE SULFATE (PF) 2 MG/ML IV SOLN
2.0000 mg | INTRAVENOUS | Status: DC | PRN
  Administered 2020-12-23 – 2020-12-24 (×5): 2 mg via INTRAVENOUS
  Filled 2020-12-23 (×5): qty 1

## 2020-12-23 MED ORDER — ACETAMINOPHEN 325 MG PO TABS
650.0000 mg | ORAL_TABLET | Freq: Four times a day (QID) | ORAL | Status: DC | PRN
  Administered 2020-12-25: 650 mg via ORAL
  Filled 2020-12-23: qty 2

## 2020-12-23 MED ORDER — ONDANSETRON HCL 4 MG/2ML IJ SOLN
4.0000 mg | Freq: Four times a day (QID) | INTRAMUSCULAR | Status: DC | PRN
  Administered 2020-12-23 – 2020-12-30 (×8): 4 mg via INTRAVENOUS
  Filled 2020-12-23 (×9): qty 2

## 2020-12-23 MED ORDER — MORPHINE SULFATE (PF) 4 MG/ML IV SOLN
4.0000 mg | Freq: Once | INTRAVENOUS | Status: AC
  Administered 2020-12-23: 4 mg via INTRAVENOUS
  Filled 2020-12-23: qty 1

## 2020-12-23 MED ORDER — PROCHLORPERAZINE EDISYLATE 10 MG/2ML IJ SOLN: 10.0000 mg | Freq: Once | INTRAMUSCULAR | Status: DC

## 2020-12-23 MED ORDER — IRBESARTAN 150 MG PO TABS
150.0000 mg | ORAL_TABLET | Freq: Every day | ORAL | Status: DC
  Administered 2020-12-23 – 2020-12-31 (×9): 150 mg via ORAL
  Filled 2020-12-23 (×9): qty 1

## 2020-12-23 MED ORDER — ONDANSETRON HCL 4 MG/2ML IJ SOLN: 4.0000 mg | Freq: Once | INTRAMUSCULAR | Status: DC

## 2020-12-23 MED ORDER — NITROGLYCERIN 0.4 MG SL SUBL
0.4000 mg | SUBLINGUAL_TABLET | SUBLINGUAL | Status: DC | PRN
  Administered 2020-12-23 (×2): 0.4 mg via SUBLINGUAL
  Filled 2020-12-23: qty 1

## 2020-12-23 MED ORDER — LACTATED RINGERS IV SOLN: INTRAVENOUS | Status: DC

## 2020-12-23 MED ORDER — MORPHINE SULFATE (PF) 4 MG/ML IV SOLN
4.0000 mg | Freq: Once | INTRAVENOUS | Status: DC
Start: 2020-12-23 — End: 2020-12-23

## 2020-12-23 MED ORDER — ENOXAPARIN SODIUM 40 MG/0.4ML IJ SOSY
40.0000 mg | PREFILLED_SYRINGE | INTRAMUSCULAR | Status: DC
  Administered 2020-12-23 – 2020-12-29 (×5): 40 mg via SUBCUTANEOUS
  Filled 2020-12-23 (×6): qty 0.4

## 2020-12-23 MED ORDER — HYDRALAZINE HCL 20 MG/ML IJ SOLN: 5.0000 mg | INTRAMUSCULAR | Status: DC | PRN

## 2020-12-23 MED ORDER — LACTATED RINGERS IV BOLUS
1000.0000 mL | Freq: Once | INTRAVENOUS | Status: AC
  Administered 2020-12-23: 1000 mL via INTRAVENOUS

## 2020-12-23 MED ORDER — ACETAMINOPHEN 650 MG RE SUPP: 650.0000 mg | Freq: Four times a day (QID) | RECTAL | Status: DC | PRN

## 2020-12-23 NOTE — ED Notes (Signed)
Dr Debe Coder notified about chest pain. Patient is grabbing at his chest, new 12 lead EKG taken.

## 2020-12-23 NOTE — ED Notes (Signed)
Patient transported to CT 

## 2020-12-23 NOTE — ED Provider Notes (Signed)
  Physical Exam  BP (!) 176/107   Pulse 85   Temp 97.7 F (36.5 C) (Oral)   Resp (!) 22   SpO2 96%   Physical Exam Vitals and nursing note reviewed.  Constitutional:      Appearance: He is well-developed.  HENT:     Head: Normocephalic and atraumatic.  Eyes:     Conjunctiva/sclera: Conjunctivae normal.  Cardiovascular:     Rate and Rhythm: Normal rate and regular rhythm.     Heart sounds: No murmur heard. Pulmonary:     Effort: Pulmonary effort is normal. No respiratory distress.     Breath sounds: Normal breath sounds.  Abdominal:     Palpations: Abdomen is soft.     Tenderness: There is abdominal tenderness (epigastric).  Musculoskeletal:     Cervical back: Neck supple.  Skin:    General: Skin is warm and dry.  Neurological:     Mental Status: He is alert.    ED Course/Procedures   Procedures  MDM  Patient received in handoff with chest pain and epigastric pain, history of complex pancreatitis.  Laboratory evaluation with elevated lipase to 102.  CT obtained to rule out pseudocyst which was reassuringly negative for pseudocyst but does show evidence of acute pancreatitis.  Patient given pain control and admitted to medicine for acute pancreatitis.       Teressa Lower, MD 12/23/20 [phone]

## 2020-12-23 NOTE — H&P (Addendum)
History and Physical    Kyle Berry [id number] DOB: [birth year] DOA: 12/23/2020  PCP: [facility] Consultants:  Oval Linsey - cardiology; nephrology; Cleda Mccreedy - GI, [facility] Patient coming from:  Home - lives with girlfriend; NOK: Mother, Adhrit Caccavale, [phone]   Chief Complaint: chest pain  HPI: Kyle Berry is a 50 y.o. male with medical history significant of chronic pancreatitis with h/o stent placement; HTN; HLD; and solitary kidney presenting with CP.   He was here for CP r/o from 8/3-5 and cardiology did not recommend further evaluation (had normal echo).  He woke up about 1230 with SOB.  He vomited and then he started hurting with LUQ pain with radiation to the back.  He ate a salad and had a normal day yesterday.  Emesis x 5 today.  Pain is persistent.  Pain is similar to prior.     ED Course: Recent CP in August, here with persistent and worsening CP and abdominal pain.  Lipase 100, has h/o chronic pancreatitis.  Significant pain.  CT A/P ordered.  Still having CP, given NTG without improvement, likely due to pancreatitis.  Review of Systems: As per HPI; otherwise review of systems reviewed and negative.   Ambulatory Status:  Ambulates without assistance  COVID Vaccine Status:  Complete plus 2 boosters  Past Medical History:  Diagnosis Date   Chronic bronchitis ([place])    Chronic lower back pain    Congenital single kidney    GERD (gastroesophageal reflux disease)    Headache    "once/month" (05/13/2017)   High cholesterol    Hypertension    Migraine    "a couple/year" (05/13/2017)   Nephrolithiasis 05/13/2017   Pancreatitis    Pneumonia ~ 09/2015   Recurrent acute pancreatitis     Past Surgical History:  Procedure Laterality Date   BIOPSY  11/02/2020   Procedure: BIOPSY;  Surgeon: Irving Copas., MD;  Location: [place];  Service: Gastroenterology;;   CHOLECYSTECTOMY N/A 10/23/2017   Procedure: LAPAROSCOPIC CHOLECYSTECTOMY  WITH INTRAOPERATIVE CHOLANGIOGRAM;  Surgeon: Johnathan Hausen, MD;  Location: [facility];  Service: General;  Laterality: N/A;   COLONOSCOPY WITH PROPOFOL N/A 11/02/2019   Procedure: COLONOSCOPY WITH PROPOFOL;  Surgeon: Irene Shipper, MD;  Location: [facility] ENDOSCOPY;  Service: Endoscopy;  Laterality: N/A;   ERCP N/A 11/02/2020   Procedure: ENDOSCOPIC RETROGRADE CHOLANGIOPANCREATOGRAPHY (ERCP);  Surgeon: Irving Copas., MD;  Location: [place];  Service: Gastroenterology;  Laterality: N/A;   ESOPHAGOGASTRODUODENOSCOPY N/A 11/02/2020   Procedure: ESOPHAGOGASTRODUODENOSCOPY (EGD);  Surgeon: Irving Copas., MD;  Location: [place];  Service: Gastroenterology;  Laterality: N/A;   ESOPHAGOGASTRODUODENOSCOPY (EGD) WITH PROPOFOL N/A 11/06/2019   Procedure: ESOPHAGOGASTRODUODENOSCOPY (EGD) WITH PROPOFOL;  Surgeon: Irene Shipper, MD;  Location[facility] END[facility]COPY;  Service: Endoscopy;  Laterality: N/A;   HEMOSTASIS CLIP PLACEMENT  11/02/2019   Procedure: HEMOSTASIS CLIP PLACEMENT;  Surgeon: Irene Shipper, MD;  Location: [facility] ENDOSCOPY;  Service: Endoscopy;;   NO PAST SURGERIES     REMOVAL OF STONES  11/02/2020   Procedure: REMOVAL OF STONES;  Surgeon: Irving Copas., MD;  Location: [place];  Service: Gastroenterology;;   Lavell Islam REMOVAL  11/02/2020   Procedure: STENT REMOVAL;  Surgeon: Irving Copas., MD;  Location: [facility] ENDOSCOPY;  Service: Gastroenterology;;    Social History   Socioeconomic History   Marital status: Single    Spouse name: Not on file   Number of children: Not on file   Years of education: Not on file  Highest education level: Not on file  Occupational History   Occupation: Engineer, building services  Tobacco Use   Smoking status: Never   Smokeless tobacco: Never  Vaping Use   Vaping Use: Never used  Substance and Sexual Activity   Alcohol use: Not Currently    Comment: remote h/o heavy use   Drug use: No   Sexual activity: Yes    Partners: Female    Birth  control/protection: Condom  Other Topics Concern   Not on file  Social History Narrative   Not on file   Social Determinants of Health   Financial Resource Strain: Not on file  Food Insecurity: Not on file  Transportation Needs: Not on file  Physical Activity: Not on file  Stress: Not on file  Social Connections: Not on file  Intimate Partner Violence: Not on file    Allergies  Allergen Reactions   Diclofenac Hives   Norvasc [Amlodipine Besylate] Other (See Comments)    Fluid buildup in chest   Omeprazole Other (See Comments)    MD stopped due to pancreatitis   Prednisone Other (See Comments)    Mood swings    Family History  Problem Relation Age of Onset   Hypertension Other    Hypertension Mother    Hypertension Father    Kidney disease Father    Hypertension Sister    Diabetes Sister    Hypertension Brother    Pancreatic cancer Paternal Grandmother    Colon cancer Cousin    Stomach cancer Neg Hx    Esophageal cancer Neg Hx     Prior to Admission medications   Medication Sig Start Date End Date Taking? Authorizing Provider  famotidine (PEPCID) 20 MG tablet Take 1 tablet (20 mg total) by mouth 2 (two) times daily. Instructions: Take Pepcid 20 mg twice daily x30 days, then take Pepcid 20 mg daily thereafter. 11/03/20 05/02/21  Kayleen Memos, DO  lipase/protease/amylase (CREON) 36000 UNITS CPEP capsule Take 2 capsules (72,000 Units total) by mouth 3 (three) times daily with meals. 02/02/20   Cristal Ford, DO  Multiple Vitamin (MULTIVITAMIN WITH MINERALS) TABS tablet Take 1 tablet by mouth daily. 11/04/20 02/02/21  Kayleen Memos, DO  oxyCODONE-acetaminophen (PERCOCET/ROXICET) 5-325 MG tablet Take 1 tablet by mouth 2 (two) times daily as needed for severe pain.    [provider]  promethazine (PHENERGAN) 25 MG tablet Take 25 mg by mouth every 6 (six) hours as needed for nausea or vomiting.    [provider]  telmisartan (MICARDIS) 40 MG tablet  Take 40 mg by mouth daily. 09/26/20   [provider]    Physical Exam: Vitals:   12/23/20 0801 12/23/20 0830 12/23/20 1330 12/23/20 1639  BP: (!) 158/107 (!) 129/96 (!) 176/107 (!) 161/91  Pulse: 92 (!) 107 85 77  Resp: 13 17 (!) 22 20  Temp:    98.2 F (36.8 C)  TempSrc:    Oral  SpO2: 99% 94% 96% 97%     General:  Appears calm and comfortable and is in NAD Eyes:  PERRL, EOMI, normal lids, iris ENT:  grossly normal hearing, lips & tongue, mmm Neck:  no LAD, masses or thyromegaly Cardiovascular:  RRR, no m/r/g. No LE edema.  Respiratory:   CTA bilaterally with no wheezes/rales/rhonchi.  Normal respiratory effort. Abdomen:  soft, diffusely TTP, minimally distended Skin:  no rash or induration seen on limited exam Musculoskeletal:  grossly normal tone BUE/BLE, good ROM, no bony abnormality Psychiatric:  blunted mood  and affect, speech fluent and appropriate, AOx3 Neurologic:  CN 2-12 grossly intact, moves all extremities in coordinated fashion    Radiological Exams on Admission: Independently reviewed - see discussion in A/P where applicable  DG Chest 2 View  Result Date: 12/23/2020 CLINICAL DATA:  Chest pain and shortness of breath. EXAM: CHEST - 2 VIEW COMPARISON:  09/13/2019 FINDINGS: The cardiac silhouette, mediastinal and hilar contours are normal. The lungs are clear. No pleural effusions. No pulmonary lesions. The bony thorax is intact. IMPRESSION: No acute cardiopulmonary findings. Electronically Signed   By: Marijo Sanes M.D.   On: 12/23/2020 05:31   CT ABDOMEN PELVIS W CONTRAST  Result Date: 12/23/2020 CLINICAL DATA:  Abdominal pain.  History of pancreatitis. EXAM: CT ABDOMEN AND PELVIS WITH CONTRAST TECHNIQUE: Multidetector CT imaging of the abdomen and pelvis was performed using the standard protocol following bolus administration of intravenous contrast. CONTRAST:  56m OMNIPAQUE IOHEXOL 350 MG/ML SOLN COMPARISON:  November 17, 2020. FINDINGS: Lower chest: No  acute abnormality. Hepatobiliary: No focal liver abnormality is seen. Status post cholecystectomy. No biliary dilatation. Pancreas: Mild amount of fluid is noted around the pancreatic body and tail concerning for acute pancreatitis. No pseudocyst formation or ductal dilatation is noted. Spleen: Normal in size without focal abnormality. Adrenals/Urinary Tract: Adrenal glands appear normal. Severe right renal atrophy is noted. Nonobstructive calculus is noted in lower pole collecting system of left kidney. No hydronephrosis or renal obstruction is noted urinary bladder is unremarkable. Stomach/Bowel: Stomach is within normal limits. Appendix appears normal. No evidence of bowel wall thickening, distention, or inflammatory changes. Vascular/Lymphatic: No significant vascular findings are present. No enlarged abdominal or pelvic lymph nodes. Reproductive: Prostate is unremarkable. Other: No abdominal wall hernia or abnormality. No abdominopelvic ascites. Musculoskeletal: No acute or significant osseous findings. IMPRESSION: Findings are consistent with mild acute pancreatitis without pseudocyst formation. Severe right renal atrophy is again noted. Nonobstructive left renal calculus is noted. Electronically Signed   By: JMarijo ConceptionM.D.   On: 12/23/2020 10:13    EKG: Independently reviewed.   020- NSR with rate 99; no evidence of acute ischemia 0813 - NSR with rate 85; more pronounced early repolarization, reviewed with Dr. SMarlou Porch Labs on Admission: I have personally reviewed the available labs and imaging studies at the time of the admission.  Pertinent labs:   Glucose 112 Lipase 102 AST 47/ALT 31 BNP 53.1 HS troponin 4, 5 WBC 6.5 D-dimer 0.41   Assessment/Plan Principal Problem:   Acute on chronic pancreatitis (HCC) Active Problems:   Essential hypertension   High cholesterol   Acute on chronic pancreatitis -Patient with prior h/o acute and chronic pancreatitis presenting with similar  symptoms -Initial concern for chest pain but in further discussion this is more c/w epigastric and LUQ pain with associated n/v -Now with frank pancreatitis by H&P, mildly elevated lipase, and CT -Will observe for now -Strict NPO for now -Aggressive IVF hydration at least for the first 12 hours with LR at 200 cc/hr -Pain control with morphine 2 mg q2h prn.   -Nausea control with Zofran -Scant LFT elevation -He has had an extensive prior evaluation with known h/o pancreas divisum and s/p ERCP with biliary duct sphincter[place]) -He takes Creon at home; will hold while NPO -If not improving quickly, recommend GI consult  HTN -Continue Micardis  HLD -He does not appear to be taking medications for this issue at this time   Solitary  kidney -Normal renal function currently -Use caution with nephrotoxic agents -Recheck BMP in AM     Note: This patient has been tested and is negative for the novel coronavirus COVID-19. The patient has been fully vaccinated against COVID-19.   Level of care: Telemetry Medical DVT prophylaxis:  Lovenox Code Status:  Full - confirmed with patient Family Communication: None present Disposition Plan:  The patient is from: home  Anticipated d/c is to: home without [facility] services  Anticipated d/c date will depend on clinical response to treatment, but possibly as early as tomorrow if he has excellent response to treatment  Patient is currently: acutely ill Consults called: None  Admission status:  It is my clinical opinion that referral for OBSERVATION is reasonable and necessary in this patient based on the above information provided. The aforementioned taken together are felt to place the patient at high risk for further clinical deterioration. However it is anticipated that the patient may be medically stable for discharge from the hospital within 24 to 48 hours.    Karmen Bongo MD [facility]   How to contact the [facility] Attending or Consulting provider [place] or covering provider during after hours [place], for this patient?  Check the care team in [facility] and look for a) attending/consulting [facility] provider listed and b) the [facility] team listed Log into [email or web address] and use [place]'s universal password to access. If you do not have the password, please contact the hospital operator. Locate the [facility] provider you are looking for under [facility] and page to a number that you can be directly reached. If you still have difficulty reaching the provider, please page the [facility] (Director on Call) for the Hospitalists listed on amion for assistance.   12/23/2020, 4:43 PM

## 2020-12-23 NOTE — ED Notes (Signed)
Patient states that his chest pain has improved with the two doses of nitor.

## 2020-12-23 NOTE — ED Notes (Signed)
He is back from CT

## 2020-12-23 NOTE — ED Provider Notes (Signed)
[facility] EMERGENCY DEPARTMENT Provider Note   CSN: [id number] Arrival date & time: 12/23/20  [id number]     History Chief Complaint  Patient presents with   Chest Pain    Kyle Berry is a 50 y.o. male.  The history is provided by the patient.  Chest Pain He has history of hypertension, hyperlipidemia, pancreatitis status post pancreatic stent for pancreas divisum and comes in because of chest and abdominal pain.  He woke up at about 12:30 AM with shortness of breath and a heavy feeling in his chest.  He did vomit several times.  He then developed severe pain across his upper abdomen which radiated to the back.  Chest pain is rated at 8/10, abdominal pain is rated at 10/10.  Pain is somewhat different than prior episodes of pancreatitis.  He denies any ethanol consumption.  He denies diaphoresis.  He is a non-smoker.  He has recently seen a cardiologist to cleared him from a cardiac standpoint.   Past Medical History:  Diagnosis Date   Chronic bronchitis (HCC)    Chronic lower back pain    Congenital single kidney    GERD (gastroesophageal reflux disease)    Headache    "once/month" (05/13/2017)   High cholesterol    Hypertension    Migraine    "a couple/year" (05/13/2017)   Nephrolithiasis 05/13/2017   Pancreatitis    Pneumonia ~ 09/2015   Recurrent acute pancreatitis     Patient Active Problem List   Diagnosis Date Noted   Chest pain, rule out acute myocardial infarction 11/17/2020   COVID-19 virus infection 11/17/2020   Gastritis and gastroduodenitis    Presence of pancreatic duct stent    Pancreatitis 04/12/2020   Urinary tract infection without hematuria    Symptomatic anemia 11/05/2019   Acute lower GI bleeding 11/02/2019   Near syncope 11/02/2019   AKI (acute kidney injury) ([place]) 11/02/2019   Colonic ulcer    Rectal bleeding 10/14/2019   Pancreatic insufficiency 05/28/2019   Recurrent acute pancreatitis 10/17/2018   Microscopic hematuria  10/17/2018   Acute pancreatitis 07/23/2018   Pancreatic divisum    Hematemesis 10/18/2017   Recurrent pancreatitis 05/13/2017   Congenital single kidney 05/13/2017   High cholesterol 05/13/2017   Kidney stone 05/13/2017   Acute on chronic pancreatitis ([place]) 01/17/2017   Hypokalemia    Nausea and vomiting    Essential hypertension    Esophageal reflux    Chronic pancreatitis ([place]) 11/23/2015    Past Surgical History:  Procedure Laterality Date   BIOPSY  11/02/2020   Procedure: BIOPSY;  Surgeon: Irving Copas., MD;  Location: [place];  Service: Gastroenterology;;   CHOLECYSTECTOMY N/A 10/23/2017   Procedure: LAPAROSCOPIC CHOLECYSTECTOMY WITH INTRAOPERATIVE CHOLANGIOGRAM;  Surgeon: Johnathan Hausen, MD;  Location[facility];  Service: General;  Laterality: N/A;   COLONOSCOPY WITH PROPOFOL N/A 11/02/2019   Procedure: COLONOSCOPY WITH PROPOFOL;  Surgeon: Irene Shipper, MD;  Location: [facility] ENDOSCOPY;  Service: Endoscopy;  Laterality: N/A;   ERCP N/A 11/02/2020   Procedure: ENDOSCOPIC RETROGRADE CHOLANGIOPANCREATOGRAPHY (ERCP);  Surgeon: Irving Copas., MD;  Location: [place];  Service: Gastroenterology;  Laterality: N/A;   ESOPHAGOGASTRODUODENOSCOPY N/A 11/02/2020   Procedure: ESOPHAGOGASTRODUODENOSCOPY (EGD);  Surgeon: Irving Copas., MD;  Location: [place];  Service: Gastroenterology;  Laterality: N/A;   ESOPHAGOGASTRODUODENOSCOPY (EGD) WITH PROPOFOL N/A 11/06/2019   Procedure: ESOPHAGOGASTRODUODENOSCOPY (EGD) WITH PROPOFOL;  Surgeon: Irene Shipper, MD;  Location[facility] ENDOSCOPY;  Service: Endoscopy;  Laterality: N/A;  HEMOSTASIS CLIP PLACEMENT  11/02/2019   Procedure: HEMOSTASIS CLIP PLACEMENT;  Surgeon: Irene Shipper, MD;  Location: [facility] ENDOSCOPY;  Service: Endoscopy;;   NO PAST SURGERIES     REMOVAL OF STONES  11/02/2020   Procedure: REMOVAL OF STONES;  Surgeon: Rush Landmark Telford Nab., MD;  Location: [place];  Service: Gastroenterology;;   Lavell Islam  REMOVAL  11/02/2020   Procedure: STENT REMOVAL;  Surgeon: Irving Copas., MD;  Location: [facility] ENDOSCOPY;  Service: Gastroenterology;;       Family History  Problem Relation Age of Onset   Hypertension Other    Hypertension Mother    Hypertension Father    Kidney disease Father    Hypertension Sister    Diabetes Sister    Hypertension Brother    Pancreatic cancer Paternal Grandmother    Colon cancer Cousin    Stomach cancer Neg Hx    Esophageal cancer Neg Hx     Social History   Tobacco Use   Smoking status: Never   Smokeless tobacco: Never  Vaping Use   Vaping Use: Never used  Substance Use Topics   Alcohol use: Not Currently    Comment: 05/13/2017 "might have beer a few times/year"   Drug use: No    Home Medications Prior to Admission medications   Medication Sig Start Date End Date Taking? Authorizing Provider  famotidine (PEPCID) 20 MG tablet Take 1 tablet (20 mg total) by mouth 2 (two) times daily. Instructions: Take Pepcid 20 mg twice daily x30 days, then take Pepcid 20 mg daily thereafter. 11/03/20 05/02/21  Kayleen Memos, DO  lipase/protease/amylase (CREON) 36000 UNITS CPEP capsule Take 2 capsules (72,000 Units total) by mouth 3 (three) times daily with meals. 02/02/20   Cristal Ford, DO  Multiple Vitamin (MULTIVITAMIN WITH MINERALS) TABS tablet Take 1 tablet by mouth daily. 11/04/20 02/02/21  Kayleen Memos, DO  oxyCODONE-acetaminophen (PERCOCET/ROXICET) 5-325 MG tablet Take 1 tablet by mouth 2 (two) times daily as needed for severe pain.    [provider]  promethazine (PHENERGAN) 25 MG tablet Take 25 mg by mouth every 6 (six) hours as needed for nausea or vomiting.    [provider]  telmisartan (MICARDIS) 40 MG tablet Take 40 mg by mouth daily. 09/26/20   [provider]    Allergies    Diclofenac, Norvasc [amlodipine besylate], Omeprazole, and Prednisone  Review of Systems   Review of Systems  Cardiovascular:  Positive  for chest pain.  All other systems reviewed and are negative.  Physical Exam Updated Vital Signs BP (!) 153/109 (BP Location: Left Arm)   Pulse 98   Temp 97.7 F (36.5 C) (Oral)   Resp 17   SpO2 98%   Physical Exam Vitals and nursing note reviewed.  50 year old male, appears uncomfortable, but is in no acute distress. Vital signs are significant for elevated blood pressure. Oxygen saturation is 98%, which is normal. Head is normocephalic and atraumatic. PERRLA, EOMI. Oropharynx is clear. Neck is nontender and supple without adenopathy or JVD. Back is nontender and there is no CVA tenderness. Lungs are clear without rales, wheezes, or rhonchi. Chest is nontender. Heart has regular rate and rhythm without murmur. Abdomen is soft, flat, with moderate tenderness across the upper abdomen, worse on the right.  There is mild voluntary guarding, no rebound tenderness.  There are no masses or hepatosplenomegaly and peristalsis is hypoactive. Extremities have no cyanosis or edema, full range of motion is present. Skin is warm and  dry without rash. Neurologic: Mental status is normal, cranial nerves are intact, there are no motor or sensory deficits.  ED Results / Procedures / Treatments   Labs (all labs ordered are listed, but only abnormal results are displayed) Labs Reviewed  BASIC METABOLIC PANEL - Abnormal; Notable for the following components:      Result Value   Sodium 134 (*)    CO2 20 (*)    Glucose, Bld 112 (*)    All other components within normal limits  CBC - Abnormal; Notable for the following components:   RBC 4.10 (*)    All other components within normal limits  TROPONIN I (HIGH SENSITIVITY)    EKG EKG Interpretation  Date/Time:  Friday December 23 2020 04:20:59 EDT Ventricular Rate:  99 PR Interval:  152 QRS Duration: 78 QT Interval:  334 QTC Calculation: 428 R Axis:   74 Text Interpretation: Normal sinus rhythm Minimal voltage criteria for LVH, may be  normal variant ( Sokolow-Lyon ) Borderline ECG When compared with ECG of 11/16/2020, No significant change was found Confirmed by Delora Fuel ([id number]) on 12/23/2020 5:37:01 AM  Radiology DG Chest 2 View  Result Date: 12/23/2020 CLINICAL DATA:  Chest pain and shortness of breath. EXAM: CHEST - 2 VIEW COMPARISON:  09/13/2019 FINDINGS: The cardiac silhouette, mediastinal and hilar contours are normal. The lungs are clear. No pleural effusions. No pulmonary lesions. The bony thorax is intact. IMPRESSION: No acute cardiopulmonary findings. Electronically Signed   By: Marijo Sanes M.D.   On: 12/23/2020 05:31    Procedures Procedures   Medications Ordered in ED Medications - No data to display  ED Course  I have reviewed the triage vital signs and the nursing notes.  Pertinent labs & imaging results that were available during my care of the patient were reviewed by me and considered in my medical decision making (see chart for details). Clinical Course as of 12/23/20 0738  Fri Dec 23, 2020  0736 Likely pancreatitis flare, previous biliary stent (?), here today with CP, abdominal pain, vomiting [MK]    Clinical Course User Index [MK] Kommor, [place], MD   MDM Rules/Calculators/A&P                         Abdominal pain suspicious for recurrent pancreatitis.  Chest pain likely secondary to pancreatitis.  Old records are reviewed confirming placement of pancreatic stent recently, evaluation by cardiology with negative nuclear stress test.  ECG today is unchanged from prior.  Initial troponin is normal.  Chest x-ray is unremarkable.  Will check lipase and hepatic function studies.  Also, given several hospitalizations for chest pain without clear reason for chest pain, will check D-dimer to rule out pulmonary embolism.  In the meantime, he is given IV fluids, morphine, ondansetron.  7:35 AM Labs still pending, IV fluids not yet started and patient continues to be in severe pain.  We will switch to IM  hydromorphone and prochlorperazine.  Case is signed out to Dr. Matilde Sprang.  Final Clinical Impression(s) / ED Diagnoses Final diagnoses:  Upper abdominal pain  Non-intractable vomiting with nausea, unspecified vomiting type  Nonspecific chest pain    Rx / DC Orders ED Discharge Orders     None        Delora Fuel, MD 0000000 [phone]

## 2020-12-23 NOTE — ED Notes (Signed)
Pt data not crossing over to chart from bedside monitor. Monitor reset at this time.

## 2020-12-23 NOTE — ED Triage Notes (Signed)
Pt c/o chest pain radiating into back and shortness of breath, starting at midnight. Pt has nausea, vomiting.

## 2020-12-23 NOTE — ED Notes (Signed)
Patient is resting comfortably. 

## 2020-12-24 ENCOUNTER — Encounter (HOSPITAL_COMMUNITY): Payer: Self-pay | Admitting: Internal Medicine

## 2020-12-24 DIAGNOSIS — Z800 Family history of malignant neoplasm of digestive organs: Secondary | ICD-10-CM | POA: Diagnosis not present

## 2020-12-24 DIAGNOSIS — Z8616 Personal history of COVID-19: Secondary | ICD-10-CM | POA: Diagnosis not present

## 2020-12-24 DIAGNOSIS — K219 Gastro-esophageal reflux disease without esophagitis: Secondary | ICD-10-CM | POA: Diagnosis present

## 2020-12-24 DIAGNOSIS — Z8249 Family history of ischemic heart disease and other diseases of the circulatory system: Secondary | ICD-10-CM | POA: Diagnosis not present

## 2020-12-24 DIAGNOSIS — Q453 Other congenital malformations of pancreas and pancreatic duct: Secondary | ICD-10-CM | POA: Diagnosis not present

## 2020-12-24 DIAGNOSIS — I129 Hypertensive chronic kidney disease with stage 1 through stage 4 chronic kidney disease, or unspecified chronic kidney disease: Secondary | ICD-10-CM | POA: Diagnosis present

## 2020-12-24 DIAGNOSIS — K861 Other chronic pancreatitis: Secondary | ICD-10-CM | POA: Diagnosis present

## 2020-12-24 DIAGNOSIS — Z833 Family history of diabetes mellitus: Secondary | ICD-10-CM | POA: Diagnosis not present

## 2020-12-24 DIAGNOSIS — Z79899 Other long term (current) drug therapy: Secondary | ICD-10-CM | POA: Diagnosis not present

## 2020-12-24 DIAGNOSIS — E7800 Pure hypercholesterolemia, unspecified: Secondary | ICD-10-CM | POA: Diagnosis present

## 2020-12-24 DIAGNOSIS — E440 Moderate protein-calorie malnutrition: Secondary | ICD-10-CM | POA: Diagnosis present

## 2020-12-24 DIAGNOSIS — K859 Acute pancreatitis without necrosis or infection, unspecified: Secondary | ICD-10-CM | POA: Diagnosis present

## 2020-12-24 DIAGNOSIS — G8929 Other chronic pain: Secondary | ICD-10-CM | POA: Diagnosis present

## 2020-12-24 DIAGNOSIS — Z841 Family history of disorders of kidney and ureter: Secondary | ICD-10-CM | POA: Diagnosis not present

## 2020-12-24 DIAGNOSIS — Z9049 Acquired absence of other specified parts of digestive tract: Secondary | ICD-10-CM | POA: Diagnosis not present

## 2020-12-24 DIAGNOSIS — E876 Hypokalemia: Secondary | ICD-10-CM | POA: Diagnosis present

## 2020-12-24 DIAGNOSIS — M545 Low back pain, unspecified: Secondary | ICD-10-CM | POA: Diagnosis present

## 2020-12-24 DIAGNOSIS — Z888 Allergy status to other drugs, medicaments and biological substances status: Secondary | ICD-10-CM | POA: Diagnosis not present

## 2020-12-24 DIAGNOSIS — R079 Chest pain, unspecified: Secondary | ICD-10-CM | POA: Diagnosis present

## 2020-12-24 DIAGNOSIS — K3184 Gastroparesis: Secondary | ICD-10-CM | POA: Diagnosis present

## 2020-12-24 LAB — PHOSPHORUS: Phosphorus: 3.3 mg/dL (ref 2.5–4.6)

## 2020-12-24 LAB — MAGNESIUM: Magnesium: 1.6 mg/dL — ABNORMAL LOW (ref 1.7–2.4)

## 2020-12-24 LAB — CBC
HCT: 36.7 % — ABNORMAL LOW (ref 39.0–52.0)
Hemoglobin: 13 g/dL (ref 13.0–17.0)
MCH: 33.5 pg (ref 26.0–34.0)
MCHC: 35.4 g/dL (ref 30.0–36.0)
MCV: 94.6 fL (ref 80.0–100.0)
Platelets: 171 10*3/uL (ref 150–400)
RBC: 3.88 MIL/uL — ABNORMAL LOW (ref 4.22–5.81)
RDW: 14.6 % (ref 11.5–15.5)
WBC: 5.9 10*3/uL (ref 4.0–10.5)
nRBC: 0 % (ref 0.0–0.2)

## 2020-12-24 LAB — COMPREHENSIVE METABOLIC PANEL
ALT: 21 U/L (ref 0–44)
AST: 25 U/L (ref 15–41)
Albumin: 3.3 g/dL — ABNORMAL LOW (ref 3.5–5.0)
Alkaline Phosphatase: 55 U/L (ref 38–126)
Anion gap: 8 (ref 5–15)
BUN: <5 mg/dL — ABNORMAL LOW (ref 6–20)
CO2: 26 mmol/L (ref 22–32)
Calcium: 8.8 mg/dL — ABNORMAL LOW (ref 8.9–10.3)
Chloride: 100 mmol/L (ref 98–111)
Creatinine, Ser: 1 mg/dL (ref 0.61–1.24)
GFR, Estimated: >60 mL/min (ref >60)
Glucose, Bld: 140 mg/dL — ABNORMAL HIGH (ref 70–99)
Potassium: 3.2 mmol/L — ABNORMAL LOW (ref 3.5–5.1)
Sodium: 134 mmol/L — ABNORMAL LOW (ref 135–145)
Total Bilirubin: 1.3 mg/dL — ABNORMAL HIGH (ref 0.3–1.2)
Total Protein: 6.7 g/dL (ref 6.5–8.1)

## 2020-12-24 MED ORDER — SODIUM CHLORIDE 0.9 % IV SOLN
12.5000 mg | Freq: Four times a day (QID) | INTRAVENOUS | Status: DC | PRN
  Administered 2020-12-25 – 2020-12-27 (×6): 12.5 mg via INTRAVENOUS
  Filled 2020-12-24: qty 12.5
  Filled 2020-12-24 (×2): qty 0.5
  Filled 2020-12-24: qty 12.5
  Filled 2020-12-24 (×4): qty 0.5
  Filled 2020-12-24: qty 12.5

## 2020-12-24 MED ORDER — KCL IN DEXTROSE-NACL 40-5-0.9 MEQ/L-%-% IV SOLN
INTRAVENOUS | Status: DC
  Filled 2020-12-24 (×14): qty 1000

## 2020-12-24 MED ORDER — POTASSIUM CHLORIDE 2 MEQ/ML IV SOLN: INTRAVENOUS | Status: DC

## 2020-12-24 MED ORDER — HYDROMORPHONE HCL 1 MG/ML IJ SOLN
1.0000 mg | INTRAMUSCULAR | Status: DC | PRN
Start: 2020-12-24 — End: 2020-12-30
  Administered 2020-12-24 – 2020-12-30 (×40): 1 mg via INTRAVENOUS
  Filled 2020-12-24 (×41): qty 1

## 2020-12-24 NOTE — Progress Notes (Signed)
Dilaudid 1 mg IV order received from Dr. Tonie Griffith.

## 2020-12-24 NOTE — Progress Notes (Signed)
The pt. is still complaining of pain of 10/10 after each Morphine 2 mg IV administration. Stated one MD told him he/she was going to replace Morphine with Dilaudid. Notified Dr. Tonie Griffith  and awaiting for a response. Will continue to monitor.

## 2020-12-24 NOTE — Progress Notes (Addendum)
Progress Note    Kyle Berry  ZOX:[id number] DOB: 1970-07-18  DOA: 12/23/2020 PCP: Cline Crock (Inactive)      Brief Narrative:    Medical records reviewed and are as summarized below:  Kyle Berry is a 50 y.o. male with medical history significant for chronic pancreatitis with history of stent placement, hypertension, hyperlipidemia, solitary left kidney, who presented to the hospital with epigastric pain, left upper quadrant pain, shortness of breath, nausea and vomiting.      Assessment/Plan:   Principal Problem:   Acute on chronic pancreatitis (HCC) Active Problems:   Essential hypertension   High cholesterol   Acute on chronic pancreatitis: Keep n.p.o. for now.  Antiemetics as needed for nausea/vomiting.  Analgesics as needed for pain (he is requiring IV Dilaudid for pain control).  Continue IV fluids for hydration.  Hypertension: Continue irbesartan  Hypokalemia: Replete potassium intravenously and monitor levels.  Check magnesium level and replete as needed.  Other comorbidities include hyperlipidemia, solitary left kidney   Diet Order             Diet NPO time specified  Diet effective now                      Consultants: None  Procedures: None    Medications:    enoxaparin (LOVENOX) injection  40 mg Subcutaneous Q24H   irbesartan  150 mg Oral Daily   Continuous Infusions:   Anti-infectives (From admission, onward)    None              Family Communication/Anticipated D/C date and plan/Code Status   DVT prophylaxis: enoxaparin (LOVENOX) injection 40 mg Start: 12/23/20 1330     Code Status: Full Code  Family Communication: None Disposition Plan:    Status is: Inpatient  Remains inpatient appropriate because:IV treatments appropriate due to intensity of illness or inability to take PO and Inpatient level of care appropriate due to severity of illness  Dispo:  Patient From:  Home  Planned Disposition: Home  Medically stable for discharge: No             Subjective:   Interval events noted.  C/o nausea and vomiting.  He said he vomited overnight and vomited about 3 times this morning.  He still has abdominal pain although abdominal pain is slowly improving.  Objective:    Vitals:   12/23/20 2021 12/24/20 0100 12/24/20 0423 12/24/20 1215  BP: (!) 158/94 (!) 172/100 (!) 162/98 (!) 161/95  Pulse: 71 78 69 73  Resp: 18 18 18 18   Temp: 98.6 F (37 C) 97.7 F (36.5 C) 98.5 F (36.9 C) 98.6 F (37 C)  TempSrc: Oral Oral Oral Oral  SpO2: 97% 98% 98% 98%   No data found.   Intake/Output Summary (Last 24 hours) at 12/24/2020 1405 Last data filed at 12/24/2020 0445 Gross per 24 hour  Intake 1709.3 ml  Output --  Net 1709.3 ml   There were no vitals filed for this visit.  Exam:  GEN: NAD SKIN: No rash EYES: EOMI ENT: MMM CV: RRR PULM: CTA B ABD: soft, ND, mild periumbilical and left upper quadrant tenderness, no rebound tenderness or guarding, +BS CNS: AAO x 3, non focal EXT: No edema or tenderness        Data Reviewed:   I have personally reviewed following labs and imaging studies:  Labs: Labs show the following:   Basic Metabolic Panel: Recent Labs  Lab  12/23/20 0440 12/24/20 0600  NA 134* 134*  K 3.6 3.2*  CL 100 100  CO2 20* 26  GLUCOSE 112* 140*  BUN 11 <5*  CREATININE 1.06 1.00  CALCIUM 9.2 8.8*   GFR Estimated Creatinine Clearance: 92.8 mL/min (by C-G formula based on SCr of 1 mg/dL). Liver Function Tests: Recent Labs  Lab 12/23/20 0652 12/24/20 0600  AST 47* 25  ALT 31 21  ALKPHOS 75 55  BILITOT 0.8 1.3*  PROT 7.8 6.7  ALBUMIN 4.0 3.3*   Recent Labs  Lab 12/23/20 0652  LIPASE 102*   No results for input(s): AMMONIA in the last 168 hours. Coagulation profile No results for input(s): INR, PROTIME in the last 168 hours.  CBC: Recent Labs  Lab 12/23/20 0440 12/24/20 0600  WBC 6.5 5.9   HGB 13.6 13.0  HCT 39.9 36.7*  MCV 97.3 94.6  PLT 198 171   Cardiac Enzymes: No results for input(s): CKTOTAL, CKMB, CKMBINDEX, TROPONINI in the last 168 hours. BNP (last 3 results) No results for input(s): PROBNP in the last 8760 hours. CBG: No results for input(s): GLUCAP in the last 168 hours. D-Dimer: Recent Labs    12/23/20 0652  DDIMER 0.41   Hgb A1c: No results for input(s): HGBA1C in the last 72 hours. Lipid Profile: No results for input(s): CHOL, HDL, LDLCALC, TRIG, CHOLHDL, LDLDIRECT in the last 72 hours. Thyroid function studies: No results for input(s): TSH, T4TOTAL, T3FREE, THYROIDAB in the last 72 hours.  Invalid input(s): FREET3 Anemia work up: No results for input(s): VITAMINB12, FOLATE, FERRITIN, TIBC, IRON, RETICCTPCT in the last 72 hours. Sepsis Labs: Recent Labs  Lab 12/23/20 0440 12/24/20 0600  WBC 6.5 5.9    Microbiology Recent Results (from the past 240 hour(s))  SARS CORONAVIRUS 2 (TAT 6-24 HRS) Nasopharyngeal Nasopharyngeal Swab     Status: None   Collection Time: 12/23/20  4:42 PM   Specimen: Nasopharyngeal Swab  Result Value Ref Range Status   SARS Coronavirus 2 NEGATIVE NEGATIVE Final    Comment: (NOTE) SARS-CoV-2 target nucleic acids are NOT DETECTED.  The SARS-CoV-2 RNA is generally detectable in upper and lower respiratory specimens during the acute phase of infection. Negative results do not preclude SARS-CoV-2 infection, do not rule out co-infections with other pathogens, and should not be used as the sole basis for treatment or other patient management decisions. Negative results must be combined with clinical observations, patient history, and epidemiological information. The expected result is Negative.  Fact Sheet for Patients: HairSlick.no  Fact Sheet for Healthcare Providers: quierodirigir.com  This test is not yet approved or cleared by the [place] FDA and   has been authorized for detection and/or diagnosis of SARS-CoV-2 by FDA under an Emergency Use Authorization (EUA). This EUA will remain  in effect (meaning this test can be used) for the duration of the COVID-19 declaration under Se ction 564(b)(1) of the Act, 21 U.S.C. section 360bbb-3(b)(1), unless the authorization is terminated or revoked sooner.  Performed at [facility], [address], [place], [place] [address]     Procedures and diagnostic studies:  DG Chest 2 View  Result Date: 12/23/2020 CLINICAL DATA:  Chest pain and shortness of breath. EXAM: CHEST - 2 VIEW COMPARISON:  09/13/2019 FINDINGS: The cardiac silhouette, mediastinal and hilar contours are normal. The lungs are clear. No pleural effusions. No pulmonary lesions. The bony thorax is intact. IMPRESSION: No acute cardiopulmonary findings. Electronically Signed   By: Orlene Plum.D.  On: 12/23/2020 05:31   CT ABDOMEN PELVIS W CONTRAST  Result Date: 12/23/2020 CLINICAL DATA:  Abdominal pain.  History of pancreatitis. EXAM: CT ABDOMEN AND PELVIS WITH CONTRAST TECHNIQUE: Multidetector CT imaging of the abdomen and pelvis was performed using the standard protocol following bolus administration of intravenous contrast. CONTRAST:  80mL OMNIPAQUE IOHEXOL 350 MG/ML SOLN COMPARISON:  November 17, 2020. FINDINGS: Lower chest: No acute abnormality. Hepatobiliary: No focal liver abnormality is seen. Status post cholecystectomy. No biliary dilatation. Pancreas: Mild amount of fluid is noted around the pancreatic body and tail concerning for acute pancreatitis. No pseudocyst formation or ductal dilatation is noted. Spleen: Normal in size without focal abnormality. Adrenals/Urinary Tract: Adrenal glands appear normal. Severe right renal atrophy is noted. Nonobstructive calculus is noted in lower pole collecting system of left kidney. No hydronephrosis or renal obstruction is noted urinary bladder is unremarkable. Stomach/Bowel: Stomach  is within normal limits. Appendix appears normal. No evidence of bowel wall thickening, distention, or inflammatory changes. Vascular/Lymphatic: No significant vascular findings are present. No enlarged abdominal or pelvic lymph nodes. Reproductive: Prostate is unremarkable. Other: No abdominal wall hernia or abnormality. No abdominopelvic ascites. Musculoskeletal: No acute or significant osseous findings. IMPRESSION: Findings are consistent with mild acute pancreatitis without pseudocyst formation. Severe right renal atrophy is again noted. Nonobstructive left renal calculus is noted. Electronically Signed   By: Lupita Raider M.D.   On: 12/23/2020 10:13               LOS: 0 days   Christyana Corwin  [facility]   Pager on [email or web address]. If 7PM-7AM, please contact night-coverage at [email or web address]     12/24/2020, 2:05 PM

## 2020-12-25 MED ORDER — MAGNESIUM SULFATE 2 GM/50ML IV SOLN
2.0000 g | Freq: Once | INTRAVENOUS | Status: AC
  Administered 2020-12-25: 2 g via INTRAVENOUS
  Filled 2020-12-25: qty 50

## 2020-12-25 NOTE — Progress Notes (Signed)
PROGRESS NOTE  Kyle Berry [id number] DOB: 08/26/1970 DOA: 12/23/2020 PCP: [name] (Inactive)  HPI/Recap of past 24 hours:  Kyle Berry is a 50 y.o. male with medical history significant for chronic pancreatitis with history of stent placement, hypertension, hyperlipidemia, solitary left kidney, who presented to the hospital with epigastric pain, left upper quadrant pain, shortness of breath, nausea and vomiting.  12/25/2020: Patient was seen at his bedside.  He reports his abdominal pain is improved with pain medications.  Nausea is also improved.  He is receptive to starting a clear liquid her diet.  Will obtain lipase level and repeat a CMP in the morning.  Assessment/Plan: Principal Problem:   Acute on chronic pancreatitis (HCC) Active Problems:   Essential hypertension   High cholesterol  Acute on chronic pancreatitis Start clear liquid diet today, advance as tolerated. Continue pain control Continue IV fluid Continue IV antiemetics Resume home regimen Obtain lipase and CMP in the morning  Essential hypertension BP stable Continue home irbesartan  Hypokalemia Continue IV supplement He is currently on D5 normal saline KCl 40 mEq at 75 cc/h  Hypomagnesemia Magnesium 1.6 Repleted intravenously 2 g IV magnesium supplement x1   Code Status: Full code  Family Communication: None at bedside  Disposition Plan: Discharge to home possibly on 12/26/2020 if can tolerate a soft diet.   Consultants: None  Procedures: None  Antimicrobials: None  DVT prophylaxis: Subcu Lovenox daily  Status is: Inpatient    Dispo:  Patient From: Home  Planned Disposition: Home possibly on 12/26/2020.  Medically stable for discharge: No          Objective: Vitals:   12/24/20 1215 12/24/20 1644 12/24/20 2355 12/25/20 0419  BP: (!) 161/95 (!) 158/101 (!) 146/97 (!) 146/99  Pulse: 73 76 69 64  Resp: '18 19 18 18  '$ Temp: 98.6 F (37 C) 98.4 F  (36.9 C) 97.9 F (36.6 C) 98.4 F (36.9 C)  TempSrc: Oral Oral Oral Oral  SpO2: 98% 100% 97% 97%   No intake or output data in the 24 hours ending 12/25/20 0719 There were no vitals filed for this visit.  Exam:  General: 50 y.o. year-old male well developed well nourished in no acute distress.  Alert and oriented x3. Cardiovascular: Regular rate and rhythm with no rubs or gallops.  No thyromegaly or JVD noted.   Respiratory: Clear to auscultation with no wheezes or rales. Good inspiratory effort. Abdomen: Soft mild epigastric tenderness nondistended with normal bowel sounds x4 quadrants. Musculoskeletal: No lower extremity edema. 2/4 pulses in all 4 extremities. Skin: No ulcerative lesions noted or rashes, Psychiatry: Mood is appropriate for condition and setting   Data Reviewed: CBC: Recent Labs  Lab 12/23/20 0440 12/24/20 0600  WBC 6.5 5.9  HGB 13.6 13.0  HCT 39.9 36.7*  MCV 97.3 94.6  PLT 198 [id number]   Basic Metabolic Panel: Recent Labs  Lab 12/23/20 0440 12/24/20 0600 12/24/20 0830  NA 134* 134*  --   K 3.6 3.2*  --   CL 100 100  --   CO2 20* 26  --   GLUCOSE 112* 140*  --   BUN 11 <5*  --   CREATININE 1.06 1.00  --   CALCIUM 9.2 8.8*  --   MG  --   --  1.6*  PHOS  --   --  3.3   GFR: Estimated Creatinine Clearance: 92.8 mL/min (by C-G formula based on SCr of 1 mg/dL). Liver Function Tests: Recent  Labs  Lab 12/23/20 0652 12/24/20 0600  AST 47* 25  ALT 31 21  ALKPHOS 75 55  BILITOT 0.8 1.3*  PROT 7.8 6.7  ALBUMIN 4.0 3.3*   Recent Labs  Lab 12/23/20 0652  LIPASE 102*   No results for input(s): AMMONIA in the last 168 hours. Coagulation Profile: No results for input(s): INR, PROTIME in the last 168 hours. Cardiac Enzymes: No results for input(s): CKTOTAL, CKMB, CKMBINDEX, TROPONINI in the last 168 hours. BNP (last 3 results) No results for input(s): PROBNP in the last 8760 hours. HbA1C: No results for input(s): HGBA1C in the last 72  hours. CBG: No results for input(s): GLUCAP in the last 168 hours. Lipid Profile: No results for input(s): CHOL, HDL, LDLCALC, TRIG, CHOLHDL, LDLDIRECT in the last 72 hours. Thyroid Function Tests: No results for input(s): TSH, T4TOTAL, FREET4, T3FREE, THYROIDAB in the last 72 hours. Anemia Panel: No results for input(s): VITAMINB12, FOLATE, FERRITIN, TIBC, IRON, RETICCTPCT in the last 72 hours. Urine analysis:    Component Value Date/Time   COLORURINE YELLOW 10/31/2020 0404   APPEARANCEUR HAZY (A) 10/31/2020 0404   LABSPEC 1.009 10/31/2020 0404   PHURINE 5.0 10/31/2020 0404   GLUCOSEU NEGATIVE 10/31/2020 0404   HGBUR LARGE (A) 10/31/2020 0404   BILIRUBINUR NEGATIVE 10/31/2020 0404   KETONESUR 5 (A) 10/31/2020 0404   PROTEINUR NEGATIVE 10/31/2020 0404   UROBILINOGEN 0.2 01/20/2015 1643   NITRITE NEGATIVE 10/31/2020 0404   LEUKOCYTESUR NEGATIVE 10/31/2020 0404   Sepsis Labs: '@LABRCNTIP'$ (procalcitonin:4,lacticidven:4)  ) Recent Results (from the past 240 hour(s))  SARS CORONAVIRUS 2 (TAT 6-24 HRS) Nasopharyngeal Nasopharyngeal Swab     Status: None   Collection Time: 12/23/20  4:42 PM   Specimen: Nasopharyngeal Swab  Result Value Ref Range Status   SARS Coronavirus 2 NEGATIVE NEGATIVE Final    Comment: (NOTE) SARS-CoV-2 target nucleic acids are NOT DETECTED.  The SARS-CoV-2 RNA is generally detectable in upper and lower respiratory specimens during the acute phase of infection. Negative results do not preclude SARS-CoV-2 infection, do not rule out co-infections with other pathogens, and should not be used as the sole basis for treatment or other patient management decisions. Negative results must be combined with clinical observations, patient history, and epidemiological information. The expected result is Negative.  Fact Sheet for Patients: SugarRoll.be  Fact Sheet for Healthcare  Providers: https://www.woods-mathews.com/  This test is not yet approved or cleared by the [place] FDA and  has been authorized for detection and/or diagnosis of SARS-CoV-2 by FDA under an Emergency Use Authorization (EUA). This EUA will remain  in effect (meaning this test can be used) for the duration of the COVID-19 declaration under Se ction 564(b)(1) of the Act, 21 U.S.C. section 360bbb-3(b)(1), unless the authorization is terminated or revoked sooner.  Performed at [facility], [place] [address], [place],  [address]       Studies: No results found.  Scheduled Meds:  enoxaparin (LOVENOX) injection  40 mg Subcutaneous Q24H   irbesartan  150 mg Oral Daily    Continuous Infusions:  dextrose 5 % and 0.9 % NaCl with KCl 40 mEq/L 75 mL/hr at 12/25/20 0555   magnesium sulfate bolus IVPB     promethazine (PHENERGAN) injection (IM or IVPB)       LOS: 1 day     Kayleen Memos, MD [facility] Pager [phone]  If 7PM-7AM, please contact night-coverage [email or web address] Password [email or web address] 12/25/2020, 7:19 AM

## 2020-12-25 NOTE — Plan of Care (Signed)
  Problem: Clinical Measurements: Goal: Ability to maintain clinical measurements within normal limits will improve Outcome: Progressing   Problem: Nutrition: Goal: Adequate nutrition will be maintained Outcome: Progressing   Problem: Elimination: Goal: Will not experience complications related to bowel motility Outcome: Progressing   Problem: Pain Managment: Goal: General experience of comfort will improve Outcome: Progressing

## 2020-12-26 ENCOUNTER — Encounter (HOSPITAL_COMMUNITY): Payer: Managed Care, Other (non HMO)

## 2020-12-26 ENCOUNTER — Encounter (HOSPITAL_COMMUNITY): Payer: Self-pay

## 2020-12-26 LAB — COMPREHENSIVE METABOLIC PANEL
ALT: 19 U/L (ref 0–44)
AST: 27 U/L (ref 15–41)
Albumin: 3.1 g/dL — ABNORMAL LOW (ref 3.5–5.0)
Alkaline Phosphatase: 46 U/L (ref 38–126)
Anion gap: 5 (ref 5–15)
BUN: <5 mg/dL — ABNORMAL LOW (ref 6–20)
CO2: 28 mmol/L (ref 22–32)
Calcium: 9 mg/dL (ref 8.9–10.3)
Chloride: 103 mmol/L (ref 98–111)
Creatinine, Ser: 1.11 mg/dL (ref 0.61–1.24)
GFR, Estimated: >60 mL/min (ref >60)
Glucose, Bld: 113 mg/dL — ABNORMAL HIGH (ref 70–99)
Potassium: 4 mmol/L (ref 3.5–5.1)
Sodium: 136 mmol/L (ref 135–145)
Total Bilirubin: 1.2 mg/dL (ref 0.3–1.2)
Total Protein: 6.7 g/dL (ref 6.5–8.1)

## 2020-12-26 LAB — LIPASE, BLOOD: Lipase: 62 U/L — ABNORMAL HIGH (ref 11–51)

## 2020-12-26 MED ORDER — PANTOPRAZOLE SODIUM 40 MG IV SOLR: 40.0000 mg | Freq: Two times a day (BID) | INTRAVENOUS | Status: DC

## 2020-12-26 MED ORDER — PANTOPRAZOLE SODIUM 40 MG IV SOLR
40.0000 mg | Freq: Two times a day (BID) | INTRAVENOUS | Status: DC
  Administered 2020-12-26 – 2020-12-31 (×10): 40 mg via INTRAVENOUS
  Filled 2020-12-26 (×10): qty 40

## 2020-12-26 MED ORDER — SUCRALFATE 1 GM/10ML PO SUSP
1.0000 g | Freq: Three times a day (TID) | ORAL | Status: DC
  Administered 2020-12-26 – 2020-12-31 (×16): 1 g via ORAL
  Filled 2020-12-26 (×17): qty 10

## 2020-12-26 NOTE — Consult Note (Addendum)
[facility] Consult: 2:36 PM 12/26/2020  LOS: 2 days    Referring Provider: Dr Irene Pap  Primary Care Physician:  [name] (Inactive) Primary Gastroenterologist:  Dr. Silverio Decamp.  Drs Cephas Darby and Cleda Mccreedy at [place].    Reason for Consultation: Recurrent acute nausea, vomiting.   HPI: Kyle Berry is a 50 y.o. male. PMH CKD.   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  Also hx and testing as below for hematemesis, pain, anemia.    Lap Cholecystectomy 2019.   IgG 4 normal x 2, no drastic elevation Trigs, no Etoh, in past ? ACE inhibitor, pancreatic divisum anatomy.   10/2016 MRCP showed acute pancreatitis, suspicion for pancreas divisum, no evidence of chronic pancreatitis. 08/2016 MRI/MRCP raised question of chronic pancreatitis with focally dilated portion of dorsal pancreatic duct and fused mild irregularity of pancreatic duct.  05/27/2019 abdominal ultrasound: mild dilation of PD to 4 mm.  No fluid collections or abdominal free fluid.  Mild biliary ductal prominence normal post cholecystectomy.   CTAP 10/14/2019: Mild inflammatory changes at the pancreatic body and tail suggesting acute pancreatitis.  No pseudocyst.  Stable uncinate process calcification.  Severe, stable right kidney atrophy.  Nonobstructing left renal calculi.    Investigation of N/V, abd pain w: 10/14/19 CTAP w contrast: mild inflammation pancreatic bodyand tail.  No pseudocyst. Stable calcification at uncinate.  Lipase 6/30: 197 10/30/19 EGD: normal z line, gastritis (path: mild, chronic gastritis w/o H pylori)  10/30/19 colonoscopy. Fair prep.  20 mm cecal polyp hot snared.  30 mm rectomsigmoid polyp hot snared and site clipped.  Smaller polyps removed as well. Diverticulosis and int rrhoids noted.  Path on all polyps: TA w/o HGD.    Post polypectomy bleed, admitted 7/18 - 11/02/2019 Hgb 12.8 >> 11.5. 11/02/19 colonoscopy: ulcer and pigmented protuberance at cecum, site clipped x 4. Large polypectomy with pigemented protuberance at sigmoid also clipped x 3. Diverticulosis.  11/06/19 EGD:  for Hematemesis, hypotension, Hgb 8.4 (1 PRBC). Mild gastropathy.   01/2020 MRI abdomen/MRCP: Acute pancreatitis with stranding at the tail of the pancreas tracking to left paracolic gutter below the spleen.  Pancreas disease him.  Mild CBD dilatation measuring up to 9 mm which tapers distally in conical fashion.  Some of this dilatation may be physiologic postcholecystectomy response.  No choledocholithiasis.  Several tiny hepatic cysts.  Compensatory left kidney hypertrophy.  Some motion artifact reducing exam sensitivity and specificity 05/31/2019 MRCP: stable, peripancreatic edema, mild c/w with acute on chronic pancreatitis. Pancreas divisum, without duct dilatation.  R renal atrophy.   Home meds include Pepcid, creon.   06/2020 gastric emptying study: Solid gastric emptying at 59 minutes is 19% (normal range at 60 minutes is>10%).  Solid gastric emptying at 121 minutes is 38% (normal range at 120 minutes is >40%).  Solid gastric emptying at 242 minutes is 63% (normal range at 240 minutes is >90%). 08/19/20 ERCP w stent to PD.  Dr Cephas Darby at [facility].  S/p sphincterotomy (to cover in case of SOD).  Upper 3rd of bile duct  dilated.    Ductal flow of contrast was adequate. Contrast extended to the  hepatic ducts. The upper third of the main bile duct  was mildly dilated, acquired. The largest diameter was  10 mm. Given unclear etiology for the patients  pancreatitis outside of Ansa pancreatica the decision  was made to perform a sphincterotomy for possible  contribution of SOD. A 5 mm biliary sphincterotomy was made. The ventral pancreatic duct was deeply cannulated with  the short-nosed traction sphincterotome.  Contrast was  injected. Contrast extended to the pancreatic duct. An  Ansa loop was seen within the pancreas head. THe PD  measrued 4 mm within the body. There was mild  narrowing within the ansa loop however no clear  stricture was seen. An 0.018 inch straight standard  wire was passed into the ventral pancreatic duct. A 4  mm ventral pancreatic sphincterotomy was made with a  monofilament traction (standard) sphincterotome using  pure cut current. There was no post-sphincterotomy  bleeding. One 4 Fr by 2 cm plastic stent with two  external flaps and no internal flaps was placed into  the ventral pancreatic duct. Clear fluid flowed  through the stent. The stent was in good position. Given the ansa loop seen and concern for contribution  from possible incomplete divisum the decision was made  to pursue cannulation of the dorsal duct. Deep  cannulation and injection of contrast into the dorsal  pancreatic duct was accomplished with the MiniTome  sphincterotome. A stricture was not seen within the  dorsal pancreatic duct. An 0.018 inch gold tipped wire  was placed into the dorsal pancreatic duct. A 3 mm  dorsal pancreatic sphincterotomy was made with a  monofilament traction (standard) sphincterotome using  pure cut current. There was no post-sphincterotomy  bleeding. One 4 Fr by 5 cm plastic stent with two  external flaps and no internal flaps was placed into  the dorsal pancreatic duct. Clear fluid flowed through  the stent. The stent was in good position."   For ~ 3 weeks post ERCP, had no active GI sxs.  However after that, recurrent episodes of recurrent N/V, abd pain across upper abdomen.  Seen in urgent care 7/16 and sent home but returned 7/17 w sxs refractory to percocet, zofran, promethazine, creon.     08/25/20 CTAP w contrast: 1. No etiology for acute abdominal pain identified.  2. Persistent mild peripancreatic fat stranding consistent with history of  pancreatitis. No  peripancreatic fluid collections. One pancreatic duct  stent remains in place.  GI ROV 7/15 w ongoing sxs.  Dr Cleda Mccreedy suggested pt go to ED and Pt elected to return to [facility] and present to [facility] ED.    Evaluated for hematemesis, Lipase 127, Hgb 13.1 during admission mid July 2022.   10/30/20 CT, renal stone study: Mild acute pancreatitis, findings very similar to CT 08/10/2020.  Consider chronic pancreatitis or acute on chronic pancreatitis.   New pancreatic duct stent.  2 nonobstructing LEFT renal calculi. No ureterolithiasis or obstructive uropathy.   Severely atrophic RIGHT kidney. 11/02/2020 ERCP/EGD:  Dr. Rush Landmark:    - No gross lesions in esophagus. Z-line irregular, 43 cm from the incisors. - Gastritis. Biopsied. (Path: Gastric biopsies were benign with mild reactive changes, no H. Pylori) - Gastric polyps. Biopsied. (Path: fundic gland polyp) - Erythematous duodenopathy in bulb. - Prior minor papilla sphincterotomy appeared open. One stent was seen in the minor papilla - this was removed. - Prior biliary sphincterotomy appeared open. - Prior  pancreatic sphincterotomy appeared open. - Filling defects consistent with mucin vs pancreatic stones from recent retained stent were seen on the pancreatogram. 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 - Pancreatic stones were found and removed through the minor duct. Complete removal was accomplished via balloon sweeps.  After the 7/20 ERCP was doing well, eating and finally gaining weight.  Then ~ 1 week ago having non-exertional pain throughout length of sternum and into  bil upper quads of abdomen, different than previous pancreatitis related pain.  Seen by cardiolgist and no ekg changes of ischemia, normal Troponins, normal echocardiogram.  Cardiology did not pursue cath and plannning otpt lexiscan myoview 9/6.  Because of chest pain he cancelled the test.   Now day 4 admission for the pain. N/V w bilious emesis.  NG tube  placed, chest pain persisted.  Having difficulty advancing his diet as he has nausea with clear liquids.  Lipase 102 >> now 62.  LFTs normal.  WBCs, Hgb normal. 12/23/2020 CTAP w contrast: Mild, acute pancreatitis, no pseudocyst or evolving fluid collections.  Severe R renal atrophy persists.  Nonobstructive left renal stone.  Protonix 40 mg IV just initiated today (at home on Pepcid 20 mg bid).  PRN Phenergan in place 2 doses yest, 1 today).  IV fluids consist of D5 normal saline with potassium at 75/per hour.  Earlier received lactated Ringer's bolus and infusion. Creon not in place (at home takes Creon 72K units w meals/tid  Pt continues long term ETOH abstinence.  Never a heavy drinker.       Past Medical History:  Diagnosis Date   Chronic bronchitis (HCC)    Chronic lower back pain    Congenital single kidney    GERD (gastroesophageal reflux disease)    Headache    "once/month" (05/13/2017)   High cholesterol    Hypertension    Migraine    "a couple/year" (05/13/2017)   Nephrolithiasis 05/13/2017   Pancreatitis    Pneumonia ~ 09/2015   Recurrent acute pancreatitis     Past Surgical History:  Procedure Laterality Date   BIOPSY  11/02/2020   Procedure: BIOPSY;  Surgeon: Irving Copas., MD;  Location: [place];  Service: Gastroenterology;;   CHOLECYSTECTOMY N/A 10/23/2017   Procedure: LAPAROSCOPIC CHOLECYSTECTOMY WITH INTRAOPERATIVE CHOLANGIOGRAM;  Surgeon: Johnathan Hausen, MD;  Location: [facility];  Service: General;  Laterality: N/A;   COLONOSCOPY WITH PROPOFOL N/A 11/02/2019   Procedure: COLONOSCOPY WITH PROPOFOL;  Surgeon: Irene Shipper, MD;  Location: [facility] ENDOSCOPY;  Service: Endoscopy;  Laterality: N/A;   ERCP N/A 11/02/2020   Procedure: ENDOSCOPIC RETROGRADE CHOLANGIOPANCREATOGRAPHY (ERCP);  Surgeon: Irving Copas., MD;  Location: [place];  Service: Gastroenterology;  Laterality: N/A;   ESOPHAGOGASTRODUODENOSCOPY N/A 11/02/2020   Procedure:  ESOPHAGOGASTRODUODENOSCOPY (EGD);  Surgeon: Irving Copas., MD;  Location: [place];  Service: Gastroenterology;  Laterality: N/A;   ESOPHAGOGASTRODUODENOSCOPY (EGD) WITH PROPOFOL N/A 11/06/2019   Procedure: ESOPHAGOGASTRODUODENOSCOPY (EGD) WITH PROPOFOL;  Surgeon: Irene Shipper, MD;  Location: [facility] ENDOSCOPY;  Service: Endoscopy;  Laterality: N/A;   HEMOSTASIS CLIP PLACEMENT  11/02/2019   Procedure: HEMOSTASIS CLIP PLACEMENT;  Surgeon: Irene Shipper, MD;  Location: [facility] ENDOSCOPY;  Service: Endoscopy;;   NO PAST SURGERIES     REMOVAL OF STONES  11/02/2020   Procedure: REMOVAL OF STONES;  Surgeon: Irving Copas., MD;  Location: [place];  Service: Gastroenterology;;   Lavell Islam REMOVAL  11/02/2020   Procedure: STENT REMOVAL;  Surgeon: Irving Copas., MD;  Location: [facility] ENDOSCOPY;  Service: Gastroenterology;;    Prior to Admission medications   Medication Sig Start Date End Date Taking? Authorizing Provider  famotidine (PEPCID) 20 MG tablet Take 1 tablet (20 mg total) by mouth 2 (two) times daily. Instructions: Take Pepcid 20 mg twice daily x30 days, then take Pepcid 20 mg daily thereafter. 11/03/20 05/02/21 Yes Kayleen Memos, DO  lipase/protease/amylase (CREON) 36000 UNITS CPEP capsule Take 2 capsules (72,000 Units total) by mouth 3 (three) times daily with meals. 02/02/20  Yes Mikhail, [place], DO  Multiple Vitamin (MULTIVITAMIN WITH MINERALS) TABS tablet Take 1 tablet by mouth daily. 11/04/20 02/02/21 Yes Kayleen Memos, DO  oxyCODONE-acetaminophen (PERCOCET/ROXICET) 5-325 MG tablet Take 1 tablet by mouth 2 (two) times daily as needed for severe pain.   Yes [provider]  promethazine (PHENERGAN) 25 MG tablet Take 25 mg by mouth every 6 (six) hours as needed for nausea or vomiting.   Yes [provider]  telmisartan (MICARDIS) 40 MG tablet Take 40 mg by mouth daily. 09/26/20  Yes [provider]    Scheduled Meds:  enoxaparin (LOVENOX)  injection  40 mg Subcutaneous Q24H   irbesartan  150 mg Oral Daily   pantoprazole (PROTONIX) IV  40 mg Intravenous Q12H   Infusions:  dextrose 5 % and 0.9 % NaCl with KCl 40 mEq/L 75 mL/hr at 12/26/20 0653   promethazine (PHENERGAN) injection (IM or IVPB) 12.5 mg (12/26/20 0820)   PRN Meds: acetaminophen **OR** acetaminophen, hydrALAZINE, HYDROmorphone (DILAUDID) injection, ondansetron **OR** ondansetron (ZOFRAN) IV, promethazine (PHENERGAN) injection (IM or IVPB)   Allergies as of 12/23/2020 - Review Complete 12/23/2020  Allergen Reaction Noted   Diclofenac Hives 12/14/2013   Norvasc [amlodipine besylate] Other (See Comments) 08/15/2016   Omeprazole Other (See Comments) 08/15/2016   Prednisone Other (See Comments) 12/14/2013    Family History  Problem Relation Age of Onset   Hypertension Other    Hypertension Mother    Hypertension Father    Kidney disease Father    Hypertension Sister    Diabetes Sister    Hypertension Brother    Pancreatic cancer Paternal Grandmother    Colon cancer Cousin    Stomach cancer Neg Hx    Esophageal cancer Neg Hx     Social History   Socioeconomic History   Marital status: Single    Spouse name: Not on file   Number of children: Not on file   Years of education: Not on file   Highest education level: Not on file  Occupational History   Occupation: Engineer, building services  Tobacco Use   Smoking status: Never   Smokeless tobacco: Never  Vaping Use   Vaping Use: Never used  Substance and Sexual Activity   Alcohol use: Not Currently    Comment: remote h/o heavy use   Drug use: No   Sexual activity: Yes    Partners: Female    Birth control/protection: Condom  Other Topics Concern   Not on file  Social History Narrative   Not on file   Social Determinants of Health   Financial Resource Strain: Not on file  Food Insecurity: Not on file  Transportation Needs: Not on file  Physical Activity: Not on file  Stress: Not on file  Social  Connections: Not on file  Intimate Partner Violence: Not on file    REVIEW OF SYSTEMS: Constitutional: Feels tired but not profoundly fatigued. ENT:  No nose bleeds Pulm: No cough.  No shortness of breath CV:  No palpitations, no LE edema.  See HPI GU:  No hematuria, no frequency GI: See HPI. Heme: No excessive or unusual bleeding or bruising. Transfusions: None. Neuro:  No headaches, no peripheral tingling or numbness.  No syncope, no seizures Derm:  No itching, no rash or sores.  Endocrine:  No sweats or chills.  No polyuria or dysuria Immunization: Vaccinated and boosted x2 for COVID-19.    PHYSICAL EXAM: Vital signs in last 24 hours: Vitals:   12/26/20 0515 12/26/20 1231  BP: (!) 163/94 (!) 155/98  Pulse: 67 72  Resp: 17 16  Temp: 98.7 F (37.1 C) 99.1 F (37.3 C)  SpO2: 98% 99%   Wt Readings from Last 3 Encounters:  12/14/20 75.1 kg  11/17/20 70.9 kg  10/31/20 72.8 kg    General: Patient looks well.  Comfortable.  Alert, conversant. Head: No facial asymmetry or swelling.  No signs of head trauma. Eyes: No scleral icterus. Ears: Not hard of hearing Nose: No discharge or congestion. Mouth: Tongue midline.  Mucosa moist, pink, clear.  Good dentition. Neck: No JVD. Lungs: Clear without labored breathing or cough. Heart: RRR. Abdomen: Soft.  Tenderness without guarding across the upper abdomen bilaterally.  No masses, HSM, bruits, hernias..   Rectal: Deferred. Musc/Skeltl: No obvious joint swelling or deformities. Extremities: No CCE. Neurologic: Alert.  Oriented x 3.  Moves all 4 limbs.  No tremor.  No gross deficits. Skin: No jaundice. Psych: Pleasant demeanor, calm, cooperative.  Fluid speech.  Intake/Output from previous day: 09/11 0701 - 09/12 0700 In: 1730.3 [I.V.:1630.3; IV Piggyback:100] Out: -  Intake/Output this shift: Total I/O In: 290 [P.O.:240; IV Piggyback:50] Out: -   LAB RESULTS: Recent Labs    12/24/20 0600  WBC 5.9  HGB 13.0  HCT  36.7*  PLT 171   BMET Lab Results  Component Value Date   NA 136 12/26/2020   NA 134 (L) 12/24/2020   NA 134 (L) 12/23/2020   K 4.0 12/26/2020   K 3.2 (L) 12/24/2020   K 3.6 12/23/2020   CL 103 12/26/2020   CL 100 12/24/2020   CL 100 12/23/2020   CO2 28 12/26/2020   CO2 26 12/24/2020   CO2 20 (L) 12/23/2020   GLUCOSE 113 (H) 12/26/2020   GLUCOSE 140 (H) 12/24/2020   GLUCOSE 112 (H) 12/23/2020   BUN <5 (L) 12/26/2020   BUN <5 (L) 12/24/2020   BUN 11 12/23/2020   CREATININE 1.11 12/26/2020   CREATININE 1.00 12/24/2020   CREATININE 1.06 12/23/2020   CALCIUM 9.0 12/26/2020   CALCIUM 8.8 (L) 12/24/2020   CALCIUM 9.2 12/23/2020   LFT Recent Labs    12/24/20 0600 12/26/20 0628  PROT 6.7 6.7  ALBUMIN 3.3* 3.1*  AST 25 27  ALT 21 19  ALKPHOS 55 46  BILITOT 1.3* 1.2   PT/INR Lab Results  Component Value Date   INR 1.0 10/31/2020   INR 1.1 11/02/2019   INR 1.11 10/19/2017   Hepatitis Panel No results for input(s): HEPBSAG, HCVAB, HEPAIGM, HEPBIGM in the last 72 hours. C-Diff No components found for: CDIFF Lipase     Component Value Date/Time   LIPASE 62 (H) 12/26/2020 0628    Drugs of Abuse     Component Value Date/Time   LABOPIA POSITIVE (A) 10/12/2017 0300   COCAINSCRNUR NONE DETECTED 10/12/2017 0300   LABBENZ NONE DETECTED 10/12/2017 0300   AMPHETMU NONE DETECTED 10/12/2017 0300   THCU NONE DETECTED 10/12/2017 0300   LABBARB (A) 10/12/2017 0300  Result not available. Reagent lot number recalled by manufacturer.     RADIOLOGY STUDIES: No results found.    IMPRESSION:   Chest and bilateral upper quadrant pain.  Nausea and bilious emesis.  Different from previous episodes of pancreatitis.  CT scan suggests acute pancreatitis and lipase is mildly elevated at 102.  However not convinced this is acute, recurrent pancreatitis.  ? Esophagitis (infectious vs GERD?).    Hx recurrent bouts of idiopathic pancreatitis with established diagnosis of  incomplete pancreas divisum.  S/p 08/19/2020 ERCP, biliary and pancreatic sphincterotomy, placement of plastic biliary stent to pancreatic duct for ?  Sphincter of Oddi dysfunction.  Plastic pancreatic stent removed and stones removed from minor pancreatic duct (from retained pancreatic stent) at subsequent 11/02/2020 ERCP/EGD    PLAN:       Add carafate, switch pepcid to Protonix 40 mg bid.  TSH and cortisol level in AM.     Azucena Freed  12/26/2020, 2:36 PM Phone [phone]

## 2020-12-26 NOTE — Progress Notes (Addendum)
Patient c/o increase nausea after 50% of clear liquid diet this AM and request for phenergan and verbalized that zofran does not work for him. Waiting for phenergan to be sent from pharmacy.

## 2020-12-26 NOTE — Progress Notes (Signed)
PROGRESS NOTE  Kyle Berry [id number] DOB: 1970-09-07 DOA: 12/23/2020 PCP: [name] (Inactive)  HPI/Recap of past 24 hours:  Kyle Berry is a 50 y.o. male with medical history significant for chronic pancreatitis with history of stent placement, gastritis, hypertension, hyperlipidemia, solitary left kidney, who presented to the hospital with epigastric pain, left upper quadrant pain, shortness of breath, nausea and vomiting.  Lipase level elevated, CT abdomen pelvis with contrast findings consistent with mild acute pancreatitis without pseudocyst formation.  Was admitted for acute on chronic pancreatitis.  Hospital course complicated by intractable nausea vomiting despite IV antiemetics, epigastric pain despite IV narcotics.  GI consulted to assist with the management.  12/26/2020: Seen and examined at his bedside.  Reports vomiting last night.  Nausea this morning improved on IV Phenergan.  Also reports severe epigastric pain improved with IV Dilaudid however the IV Dilaudid only last for 30 minutes.  Started on IV Protonix 40 mg twice daily.  GI consulted to assist with the management.  Assessment/Plan: Principal Problem:   Acute on chronic pancreatitis (HCC) Active Problems:   Essential hypertension   High cholesterol  Acute on chronic pancreatitis, seen on CT scan Initial lipase elevated, LFTs normal, lipase normalizing. On clear liquid diet, to advance as tolerated. Vomited last night with persistent nausea this morning Continue IV Phenergan as needed.   Continue IV fluid hydration GI consulted, appreciate assistance.  Essential hypertension BP elevated, suspect contributed by pain. Continue home irbesartan IV hydralazine as needed with parameters.  Repleted, hypokalemia Continue IV supplement He is currently on D5 normal saline KCl 40 mEq at 75 cc/h  Repleted hypomagnesemia Magnesium 1.6 Repleted intravenously 2 g IV magnesium supplement  x1   Code Status: Full code  Family Communication: None at bedside  Disposition Plan: Discharge to home possibly on 12/27/2020 if can tolerate a soft diet.   Consultants: GI  Procedures: None  Antimicrobials: None  DVT prophylaxis: Subcu Lovenox daily  Status is: Inpatient    Dispo:  Patient From: Home  Planned Disposition: Home possibly on 12/27/2020 when she has signs of or when can tolerate a soft diet.  Medically stable for discharge: No          Objective: Vitals:   12/25/20 1807 12/25/20 2222 12/26/20 0515 12/26/20 1231  BP: (!) 159/97 (!) 147/92 (!) 163/94 (!) 155/98  Pulse: 78 64 67 72  Resp:  '16 17 16  '$ Temp: 98 F (36.7 C) 98.2 F (36.8 C) 98.7 F (37.1 C) 99.1 F (37.3 C)  TempSrc: Oral Oral Oral Oral  SpO2: 98% 98% 98% 99%    Intake/Output Summary (Last 24 hours) at 12/26/2020 1552 Last data filed at 12/26/2020 1400 Gross per 24 hour  Intake 2269.23 ml  Output --  Net 2269.23 ml   There were no vitals filed for this visit.  Exam:  General: 50 y.o. year-old male well developed well nourished in no acute distress.  Appears uncomfortable due to abdominal pain.  Alert and oriented x3.   Cardiovascular: Regular rate and rhythm no rubs or gallops.   Respiratory: Clear to auscultation with no wheezes or rales.   Abdomen: Soft epigastric tenderness with mild palpation.  Bowel sounds present.   Musculoskeletal: No lower extremity edema bilaterally. Skin: No ulcerative lesions noted.   Psychiatry: Mood is appropriate for condition and setting.   Data Reviewed: CBC: Recent Labs  Lab 12/23/20 0440 12/24/20 0600  WBC 6.5 5.9  HGB 13.6 13.0  HCT 39.9 36.7*  MCV 97.3 94.6  PLT 198 [id number]   Basic Metabolic Panel: Recent Labs  Lab 12/23/20 0440 12/24/20 0600 12/24/20 0830 12/26/20 0628  NA 134* 134*  --  136  K 3.6 3.2*  --  4.0  CL 100 100  --  103  CO2 20* 26  --  28  GLUCOSE 112* 140*  --  113*  BUN 11 <5*  --  <5*  CREATININE  1.06 1.00  --  1.11  CALCIUM 9.2 8.8*  --  9.0  MG  --   --  1.6*  --   PHOS  --   --  3.3  --    GFR: Estimated Creatinine Clearance: 83.6 mL/min (by C-G formula based on SCr of 1.11 mg/dL). Liver Function Tests: Recent Labs  Lab 12/23/20 0652 12/24/20 0600 12/26/20 0628  AST 47* 25 27  ALT '31 21 19  '$ ALKPHOS 75 55 46  BILITOT 0.8 1.3* 1.2  PROT 7.8 6.7 6.7  ALBUMIN 4.0 3.3* 3.1*   Recent Labs  Lab 12/23/20 0652 12/26/20 0628  LIPASE 102* 62*   No results for input(s): AMMONIA in the last 168 hours. Coagulation Profile: No results for input(s): INR, PROTIME in the last 168 hours. Cardiac Enzymes: No results for input(s): CKTOTAL, CKMB, CKMBINDEX, TROPONINI in the last 168 hours. BNP (last 3 results) No results for input(s): PROBNP in the last 8760 hours. HbA1C: No results for input(s): HGBA1C in the last 72 hours. CBG: No results for input(s): GLUCAP in the last 168 hours. Lipid Profile: No results for input(s): CHOL, HDL, LDLCALC, TRIG, CHOLHDL, LDLDIRECT in the last 72 hours. Thyroid Function Tests: No results for input(s): TSH, T4TOTAL, FREET4, T3FREE, THYROIDAB in the last 72 hours. Anemia Panel: No results for input(s): VITAMINB12, FOLATE, FERRITIN, TIBC, IRON, RETICCTPCT in the last 72 hours. Urine analysis:    Component Value Date/Time   COLORURINE YELLOW 10/31/2020 0404   APPEARANCEUR HAZY (A) 10/31/2020 0404   LABSPEC 1.009 10/31/2020 0404   PHURINE 5.0 10/31/2020 0404   GLUCOSEU NEGATIVE 10/31/2020 0404   HGBUR LARGE (A) 10/31/2020 0404   BILIRUBINUR NEGATIVE 10/31/2020 0404   KETONESUR 5 (A) 10/31/2020 0404   PROTEINUR NEGATIVE 10/31/2020 0404   UROBILINOGEN 0.2 01/20/2015 1643   NITRITE NEGATIVE 10/31/2020 0404   LEUKOCYTESUR NEGATIVE 10/31/2020 0404   Sepsis Labs: '@LABRCNTIP'$ (procalcitonin:4,lacticidven:4)  ) Recent Results (from the past 240 hour(s))  SARS CORONAVIRUS 2 (TAT 6-24 HRS) Nasopharyngeal Nasopharyngeal Swab     Status: None    Collection Time: 12/23/20  4:42 PM   Specimen: Nasopharyngeal Swab  Result Value Ref Range Status   SARS Coronavirus 2 NEGATIVE NEGATIVE Final    Comment: (NOTE) SARS-CoV-2 target nucleic acids are NOT DETECTED.  The SARS-CoV-2 RNA is generally detectable in upper and lower respiratory specimens during the acute phase of infection. Negative results do not preclude SARS-CoV-2 infection, do not rule out co-infections with other pathogens, and should not be used as the sole basis for treatment or other patient management decisions. Negative results must be combined with clinical observations, patient history, and epidemiological information. The expected result is Negative.  Fact Sheet for Patients: SugarRoll.be  Fact Sheet for Healthcare Providers: https://www.woods-mathews.com/  This test is not yet approved or cleared by the [place] FDA and  has been authorized for detection and/or diagnosis of SARS-CoV-2 by FDA under an Emergency Use Authorization (EUA). This EUA will remain  in effect (meaning this test can be used) for the duration of the COVID-19  declaration under Se ction 564(b)(1) of the Act, 21 U.S.C. section 360bbb-3(b)(1), unless the authorization is terminated or revoked sooner.  Performed at [facility], [place] [address], [place], [place] [address]       Studies: No results found.  Scheduled Meds:  enoxaparin (LOVENOX) injection  40 mg Subcutaneous Q24H   irbesartan  150 mg Oral Daily   pantoprazole (PROTONIX) IV  40 mg Intravenous Q12H    Continuous Infusions:  dextrose 5 % and 0.9 % NaCl with KCl 40 mEq/L 75 mL/hr at 12/26/20 0653   promethazine (PHENERGAN) injection (IM or IVPB) 12.5 mg (12/26/20 0820)     LOS: 2 days     Kayleen Memos, MD [facility] Pager [phone]  If 7PM-7AM, please contact night-coverage [email or web address] Password [facility] 12/26/2020, 3:52 PM

## 2020-12-27 LAB — TSH: TSH: 1.286 u[IU]/mL (ref 0.350–4.500)

## 2020-12-27 LAB — CORTISOL-AM, BLOOD: Cortisol - AM: 7.5 ug/dL (ref 6.7–22.6)

## 2020-12-27 MED ORDER — PANCRELIPASE (LIP-PROT-AMYL) 36000-114000 UNITS PO CPEP
72000.0000 [IU] | ORAL_CAPSULE | Freq: Three times a day (TID) | ORAL | Status: DC
  Administered 2020-12-27 – 2020-12-31 (×9): 72000 [IU] via ORAL
  Filled 2020-12-27 (×14): qty 2

## 2020-12-27 NOTE — Progress Notes (Signed)
PROGRESS NOTE  Kyle Berry [id number] DOB: 1970-09-22 DOA: 12/23/2020 PCP: [name] (Inactive)  HPI/Recap of past 24 hours:  Kyle Berry is a 50 y.o. male with medical history significant for chronic pancreatitis with history of stent placement, gastritis, hypertension, hyperlipidemia, solitary left kidney, who presented to the hospital with epigastric pain, left upper quadrant pain, shortness of breath, nausea and vomiting.  Lipase level 102, CT abdomen pelvis with contrast findings consistent with mild acute pancreatitis without pseudocyst formation.  Was admitted for acute on chronic pancreatitis.  Hospital course complicated by intractable nausea vomiting despite IV antiemetics, epigastric pain despite IV narcotics.  GI consulted to assist with the management.  Started on IV Protonix twice daily and Carafate 1 g 3 times daily.  12/28/2020: Reports persistent nausea epigastric pain.  Assessment/Plan: Principal Problem:   Acute on chronic pancreatitis (HCC) Active Problems:   Essential hypertension   High cholesterol  Acute on chronic pancreatitis, seen on CT scan Initial lipase 102, LFTs normal, repeat lipase 62. Diet was changed to heart diet by GI. Currently on IV Protonix 40 mg twice daily and Carafate 1 g 3 times daily IV fluid hydration IV Dilaudid as needed severe pain IV Phenergan as needed for intractable nausea Appreciate GIs assistance.  Essential hypertension, not at goal BP elevated, suspect contributed by pain. Continue home irbesartan IV hydralazine as needed with parameters.  Repleted, hypokalemia Continue IV supplement He is currently on D5 normal saline KCl 40 mEq at 75 cc/h  Repleted hypomagnesemia Magnesium 1.6 Repleted intravenously 2 g IV magnesium supplement x1  Moderate protein calorie malnutrition Albumin 3.1 Moderate muscle mass loss. Encourage oral intake as tolerated.   Code Status: Full code  Family  Communication: None at bedside  Disposition Plan: Discharge to home possibly on 12/28/2020 when GI signs of or when he can tolerate a solid diet.   Consultants: GI  Procedures: None  Antimicrobials: None  DVT prophylaxis: Subcu Lovenox daily  Status is: Inpatient   Objective: Vitals:   12/26/20 1751 12/26/20 2320 12/27/20 0456 12/27/20 1207  BP: (!) 148/100 (!) 143/88 (!) 155/96 (!) 148/95  Pulse: 77 79 69 72  Resp: '16 16 18 18  '$ Temp: 98.2 F (36.8 C) 98.6 F (37 C) 98.9 F (37.2 C) 98.6 F (37 C)  TempSrc: Oral Oral Oral Oral  SpO2: 100% 99% 99% 98%    Intake/Output Summary (Last 24 hours) at 12/27/2020 1333 Last data filed at 12/26/2020 1832 Gross per 24 hour  Intake 1242.46 ml  Output --  Net 1242.46 ml   There were no vitals filed for this visit.  Exam:  General: 50 y.o. year-old male frail-appearing in no acute distress.  He is alert and oriented x3. Cardiovascular: Regular rate and rhythm no rubs or gallops. Respiratory: Clear to auscultation with no wheezes or rales.   Abdomen: Soft epigastric tenderness present.  Bowel sounds present. Musculoskeletal: No lower extremity edema bilaterally.   Skin: No ulcerative lesions noted. Psychiatry: Mood is appropriate for condition and setting.   Data Reviewed: CBC: Recent Labs  Lab 12/23/20 0440 12/24/20 0600  WBC 6.5 5.9  HGB 13.6 13.0  HCT 39.9 36.7*  MCV 97.3 94.6  PLT 198 [id number]   Basic Metabolic Panel: Recent Labs  Lab 12/23/20 0440 12/24/20 0600 12/24/20 0830 12/26/20 0628  NA 134* 134*  --  136  K 3.6 3.2*  --  4.0  CL 100 100  --  103  CO2 20* 26  --  28  GLUCOSE 112* 140*  --  113*  BUN 11 <5*  --  <5*  CREATININE 1.06 1.00  --  1.11  CALCIUM 9.2 8.8*  --  9.0  MG  --   --  1.6*  --   PHOS  --   --  3.3  --    GFR: Estimated Creatinine Clearance: 83.6 mL/min (by C-G formula based on SCr of 1.11 mg/dL). Liver Function Tests: Recent Labs  Lab 12/23/20 0652 12/24/20 0600  12/26/20 0628  AST 47* 25 27  ALT '31 21 19  '$ ALKPHOS 75 55 46  BILITOT 0.8 1.3* 1.2  PROT 7.8 6.7 6.7  ALBUMIN 4.0 3.3* 3.1*   Recent Labs  Lab 12/23/20 0652 12/26/20 0628  LIPASE 102* 62*   No results for input(s): AMMONIA in the last 168 hours. Coagulation Profile: No results for input(s): INR, PROTIME in the last 168 hours. Cardiac Enzymes: No results for input(s): CKTOTAL, CKMB, CKMBINDEX, TROPONINI in the last 168 hours. BNP (last 3 results) No results for input(s): PROBNP in the last 8760 hours. HbA1C: No results for input(s): HGBA1C in the last 72 hours. CBG: No results for input(s): GLUCAP in the last 168 hours. Lipid Profile: No results for input(s): CHOL, HDL, LDLCALC, TRIG, CHOLHDL, LDLDIRECT in the last 72 hours. Thyroid Function Tests: Recent Labs    12/27/20 0505  TSH 1.286   Anemia Panel: No results for input(s): VITAMINB12, FOLATE, FERRITIN, TIBC, IRON, RETICCTPCT in the last 72 hours. Urine analysis:    Component Value Date/Time   COLORURINE YELLOW 10/31/2020 0404   APPEARANCEUR HAZY (A) 10/31/2020 0404   LABSPEC 1.009 10/31/2020 0404   PHURINE 5.0 10/31/2020 0404   GLUCOSEU NEGATIVE 10/31/2020 0404   HGBUR LARGE (A) 10/31/2020 0404   BILIRUBINUR NEGATIVE 10/31/2020 0404   KETONESUR 5 (A) 10/31/2020 0404   PROTEINUR NEGATIVE 10/31/2020 0404   UROBILINOGEN 0.2 01/20/2015 1643   NITRITE NEGATIVE 10/31/2020 0404   LEUKOCYTESUR NEGATIVE 10/31/2020 0404   Sepsis Labs: '@LABRCNTIP'$ (procalcitonin:4,lacticidven:4)  ) Recent Results (from the past 240 hour(s))  SARS CORONAVIRUS 2 (TAT 6-24 HRS) Nasopharyngeal Nasopharyngeal Swab     Status: None   Collection Time: 12/23/20  4:42 PM   Specimen: Nasopharyngeal Swab  Result Value Ref Range Status   SARS Coronavirus 2 NEGATIVE NEGATIVE Final    Comment: (NOTE) SARS-CoV-2 target nucleic acids are NOT DETECTED.  The SARS-CoV-2 RNA is generally detectable in upper and lower respiratory specimens during  the acute phase of infection. Negative results do not preclude SARS-CoV-2 infection, do not rule out co-infections with other pathogens, and should not be used as the sole basis for treatment or other patient management decisions. Negative results must be combined with clinical observations, patient history, and epidemiological information. The expected result is Negative.  Fact Sheet for Patients: SugarRoll.be  Fact Sheet for Healthcare Providers: https://www.woods-mathews.com/  This test is not yet approved or cleared by the [place] FDA and  has been authorized for detection and/or diagnosis of SARS-CoV-2 by FDA under an Emergency Use Authorization (EUA). This EUA will remain  in effect (meaning this test can be used) for the duration of the COVID-19 declaration under Se ction 564(b)(1) of the Act, 21 U.S.C. section 360bbb-3(b)(1), unless the authorization is terminated or revoked sooner.  Performed at [facility], [place] [address], [place], [place] [address]       Studies: No results found.  Scheduled Meds:  enoxaparin (LOVENOX) injection  40 mg Subcutaneous Q24H  irbesartan  150 mg Oral Daily   pantoprazole (PROTONIX) IV  40 mg Intravenous Q12H   sucralfate  1 g Oral TID WC & HS    Continuous Infusions:  dextrose 5 % and 0.9 % NaCl with KCl 40 mEq/L 75 mL/hr at 12/27/20 0708   promethazine (PHENERGAN) injection (IM or IVPB) 12.5 mg (12/27/20 0149)     LOS: 3 days     Kayleen Memos, MD [facility] Pager [phone]  If 7PM-7AM, please contact night-coverage [email or web address] Password [email or web address] 12/27/2020, 1:33 PM

## 2020-12-27 NOTE — Progress Notes (Signed)
Daily Rounding Note  12/27/2020, 1:22 PM  LOS: 3 days   SUBJECTIVE:   Chief complaint: Nausea, vomiting, chest and upper abdominal pain.  Nausea, vomiting, chest/upper abdominal pain all improved.  Tolerating clear liquids.  Had brown stool this morning.  Overall feeling much better.  OBJECTIVE:         Vital signs in last 24 hours:    Temp:  [98.2 F (36.8 C)-98.9 F (37.2 C)] 98.6 F (37 C) (09/13 1207) Pulse Rate:  [69-79] 72 (09/13 1207) Resp:  [16-18] 18 (09/13 1207) BP: (143-155)/(88-100) 148/95 (09/13 1207) SpO2:  [98 %-100 %] 98 % (09/13 1207) Last BM Date: 12/26/20 There were no vitals filed for this visit. General: NAD, comfortable.  Looks well Heart: RRR Chest: No labored breathing.  No cough.  Lungs clear bilaterally Abdomen: Soft.  Minor tenderness across the upper abdomen without guarding or rebound.  No distention.  Bowel sounds active. Extremities: No CCE. Neuro/Psych: Does not always.  Calm.  Fluid speech.  No tremors or gross weakness.  Intake/Output from previous day: 09/12 0701 - 09/13 0700 In: 1532.5 [P.O.:480; I.V.:952.5; IV Piggyback:100] Out: -   Intake/Output this shift: No intake/output data recorded.  Lab Results: No results for input(s): WBC, HGB, HCT, PLT in the last 72 hours. BMET Recent Labs    12/26/20 0628  NA 136  K 4.0  CL 103  CO2 28  GLUCOSE 113*  BUN <5*  CREATININE 1.11  CALCIUM 9.0   LFT Recent Labs    12/26/20 0628  PROT 6.7  ALBUMIN 3.1*  AST 27  ALT 19  ALKPHOS 46  BILITOT 1.2   PT/INR No results for input(s): LABPROT, INR in the last 72 hours. Hepatitis Panel No results for input(s): HEPBSAG, HCVAB, HEPAIGM, HEPBIGM in the last 72 hours.  Studies/Results: No results found.  Scheduled Meds:  enoxaparin (LOVENOX) injection  40 mg Subcutaneous Q24H   irbesartan  150 mg Oral Daily   pantoprazole (PROTONIX) IV  40 mg Intravenous Q12H    sucralfate  1 g Oral TID WC & HS   Continuous Infusions:  dextrose 5 % and 0.9 % NaCl with KCl 40 mEq/L 75 mL/hr at 12/27/20 0708   promethazine (PHENERGAN) injection (IM or IVPB) 12.5 mg (12/27/20 0149)   PRN Meds:.acetaminophen **OR** acetaminophen, hydrALAZINE, HYDROmorphone (DILAUDID) injection, ondansetron **OR** ondansetron (ZOFRAN) IV, promethazine (PHENERGAN) injection (IM or IVPB)   ASSESMENT:   Chest and bilateral upper quadrant pain.  Nausea and bilious emesis.  Different from previous episodes of pancreatitis.  CT scan suggests acute pancreatitis, lipase 102 >> 62.  However not convinced this is acute, recurrent pancreatitis.  ? Esophagitis (infectious vs GERD?).  Patient symptoms are all improved but not yet resolved with Protonix 40 IV bid, Carafate ACHS in place.   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  S/p 08/19/2020 ERCP, biliary and pancreatic sphincterotomy, placement of plastic biliary stent to pancreatic duct for ?  Sphincter of Oddi dysfunction.  Plastic pancreatic stent removed and stones removed from minor pancreatic duct (from retained pancreatic stent) at subsequent 11/02/2020 ERCP/EGD.   Lipase 102 >> 62.  Not convinced he is having another bout with a acute pancreatitis.   PLAN   Advanced diet to heart healthy.  Cautioned him not to eat too much at once.  Continue Carafate, IV Protonix    Kyle Berry  12/27/2020, 1:22 PM Phone [phone]

## 2020-12-28 ENCOUNTER — Encounter (HOSPITAL_COMMUNITY): Payer: Self-pay | Admitting: Internal Medicine

## 2020-12-28 LAB — COMPREHENSIVE METABOLIC PANEL
ALT: 24 U/L (ref 0–44)
AST: 28 U/L (ref 15–41)
Albumin: 3.1 g/dL — ABNORMAL LOW (ref 3.5–5.0)
Alkaline Phosphatase: 52 U/L (ref 38–126)
Anion gap: 5 (ref 5–15)
BUN: <5 mg/dL — ABNORMAL LOW (ref 6–20)
CO2: 28 mmol/L (ref 22–32)
Calcium: 9 mg/dL (ref 8.9–10.3)
Chloride: 102 mmol/L (ref 98–111)
Creatinine, Ser: 1.1 mg/dL (ref 0.61–1.24)
GFR, Estimated: >60 mL/min (ref >60)
Glucose, Bld: 121 mg/dL — ABNORMAL HIGH (ref 70–99)
Potassium: 4.2 mmol/L (ref 3.5–5.1)
Sodium: 135 mmol/L (ref 135–145)
Total Bilirubin: 0.5 mg/dL (ref 0.3–1.2)
Total Protein: 6.6 g/dL (ref 6.5–8.1)

## 2020-12-28 LAB — CBC
HCT: 36.3 % — ABNORMAL LOW (ref 39.0–52.0)
Hemoglobin: 12.1 g/dL — ABNORMAL LOW (ref 13.0–17.0)
MCH: 33.6 pg (ref 26.0–34.0)
MCHC: 33.3 g/dL (ref 30.0–36.0)
MCV: 100.8 fL — ABNORMAL HIGH (ref 80.0–100.0)
Platelets: 195 10*3/uL (ref 150–400)
RBC: 3.6 MIL/uL — ABNORMAL LOW (ref 4.22–5.81)
RDW: 14.8 % (ref 11.5–15.5)
WBC: 4.8 10*3/uL (ref 4.0–10.5)
nRBC: 0 % (ref 0.0–0.2)

## 2020-12-28 NOTE — Progress Notes (Signed)
PROGRESS NOTE    Kyle Berry  [id number] DOB: October 17, 1970 DOA: 12/23/2020 PCP: [name] (Inactive)    Brief Narrative:  50 year old gentleman with history of chronic recurrent pancreatitis, history of pancreatic divisum status post ERCP, biliary and pancreatic sphincterectomy and placement of plastic biliary stent to pancreatic duct presented to the emergency room with another bout of acute epigastric pain and treated as pancreatitis.   Assessment & Plan:   Principal Problem:   Acute on chronic pancreatitis [facility]) Active Problems:   Essential hypertension   High cholesterol  Acute on chronic recurrent pancreatitis: Initially elevated lipase 102 that is trending down. Without complications.  Continues to have significant pain and intolerance to regular diet. Go back on full liquid diet today.  Continue IV fluids.  Continue adequate IV and oral opiates for pain relief as well as nausea medications.  Followed by gastroenterology.  Do not anticipate any surgical procedure.  Essential hypertension: Blood pressures stabilized on home doses of irbesartan.  Hypokalemia: Repleted and adequate.  Hypomagnesemia: Repleted and adequate.   DVT prophylaxis: enoxaparin (LOVENOX) injection 40 mg Start: 12/23/20 1330   Code Status: Full code Family Communication: None Disposition Plan: Status is: Inpatient  Remains inpatient appropriate because:IV treatments appropriate due to intensity of illness or inability to take PO and Inpatient level of care appropriate due to severity of illness  Dispo:  Patient From: Home  Planned Disposition: Home  Medically stable for discharge: No           Consultants:  Gastroenterology  Procedures:  None  Antimicrobials:  None   Subjective: Severe pain and vomiting after eating regular food in the morning.  He did okay on liquid diet last night but after eating solid food he has more pain and nausea.  Wants to go back on  liquids.  No other overnight events.  Using frequent pain medications.  Objective: Vitals:   12/27/20 1709 12/27/20 2329 12/28/20 0453 12/28/20 1203  BP: (!) 173/100 (!) 141/74 (!) 155/97 (!) 149/102  Pulse: 72 74 68 69  Resp: '18 17 16 16  '$ Temp: 98.5 F (36.9 C) 98.2 F (36.8 C) 98.3 F (36.8 C) 98.5 F (36.9 C)  TempSrc: Oral Oral Oral Oral  SpO2: 98% 99% 100% 99%    Intake/Output Summary (Last 24 hours) at 12/28/2020 1416 Last data filed at 12/28/2020 0455 Gross per 24 hour  Intake 1746.58 ml  Output --  Net 1746.58 ml   There were no vitals filed for this visit.  Examination:  General exam: Appears calm and comfortable, looks comfortable. Respiratory system: Clear to auscultation. Respiratory effort normal. Cardiovascular system: S1 & S2 heard, RRR. No JVD, murmurs, rubs, gallops or clicks. No pedal edema. Gastrointestinal system: Soft.  Mildly tender in the epigastrium.  No rigidity or guarding. Central nervous system: Alert and oriented. No focal neurological deficits. Extremities: Symmetric 5 x 5 power. Skin: No rashes, lesions or ulcers Psychiatry: Judgement and insight appear normal. Mood & affect appropriate.     Data Reviewed: I have personally reviewed following labs and imaging studies  CBC: Recent Labs  Lab 12/23/20 0440 12/24/20 0600 12/28/20 0232  WBC 6.5 5.9 4.8  HGB 13.6 13.0 12.1*  HCT 39.9 36.7* 36.3*  MCV 97.3 94.6 100.8*  PLT 198 171 0000000   Basic Metabolic Panel: Recent Labs  Lab 12/23/20 0440 12/24/20 0600 12/24/20 0830 12/26/20 0628 12/28/20 0232  NA 134* 134*  --  136 135  K 3.6 3.2*  --  4.0 4.2  CL 100 100  --  103 102  CO2 20* 26  --  28 28  GLUCOSE 112* 140*  --  113* 121*  BUN 11 <5*  --  <5* <5*  CREATININE 1.06 1.00  --  1.11 1.10  CALCIUM 9.2 8.8*  --  9.0 9.0  MG  --   --  1.6*  --   --   PHOS  --   --  3.3  --   --    GFR: CrCl cannot be calculated (Unknown ideal weight.). Liver Function Tests: Recent Labs  Lab  12/23/20 0652 12/24/20 0600 12/26/20 0628 12/28/20 0232  AST 47* '25 27 28  '$ ALT '31 21 19 24  '$ ALKPHOS 75 55 46 52  BILITOT 0.8 1.3* 1.2 0.5  PROT 7.8 6.7 6.7 6.6  ALBUMIN 4.0 3.3* 3.1* 3.1*   Recent Labs  Lab 12/23/20 0652 12/26/20 0628  LIPASE 102* 62*   No results for input(s): AMMONIA in the last 168 hours. Coagulation Profile: No results for input(s): INR, PROTIME in the last 168 hours. Cardiac Enzymes: No results for input(s): CKTOTAL, CKMB, CKMBINDEX, TROPONINI in the last 168 hours. BNP (last 3 results) No results for input(s): PROBNP in the last 8760 hours. HbA1C: No results for input(s): HGBA1C in the last 72 hours. CBG: No results for input(s): GLUCAP in the last 168 hours. Lipid Profile: No results for input(s): CHOL, HDL, LDLCALC, TRIG, CHOLHDL, LDLDIRECT in the last 72 hours. Thyroid Function Tests: Recent Labs    12/27/20 0505  TSH 1.286   Anemia Panel: No results for input(s): VITAMINB12, FOLATE, FERRITIN, TIBC, IRON, RETICCTPCT in the last 72 hours. Sepsis Labs: No results for input(s): PROCALCITON, LATICACIDVEN in the last 168 hours.  Recent Results (from the past 240 hour(s))  SARS CORONAVIRUS 2 (TAT 6-24 HRS) Nasopharyngeal Nasopharyngeal Swab     Status: None   Collection Time: 12/23/20  4:42 PM   Specimen: Nasopharyngeal Swab  Result Value Ref Range Status   SARS Coronavirus 2 NEGATIVE NEGATIVE Final    Comment: (NOTE) SARS-CoV-2 target nucleic acids are NOT DETECTED.  The SARS-CoV-2 RNA is generally detectable in upper and lower respiratory specimens during the acute phase of infection. Negative results do not preclude SARS-CoV-2 infection, do not rule out co-infections with other pathogens, and should not be used as the sole basis for treatment or other patient management decisions. Negative results must be combined with clinical observations, patient history, and epidemiological information. The expected result is Negative.  Fact Sheet  for Patients: SugarRoll.be  Fact Sheet for Healthcare Providers: https://www.woods-mathews.com/  This test is not yet approved or cleared by the [place] FDA and  has been authorized for detection and/or diagnosis of SARS-CoV-2 by FDA under an Emergency Use Authorization (EUA). This EUA will remain  in effect (meaning this test can be used) for the duration of the COVID-19 declaration under Se ction 564(b)(1) of the Act, 21 U.S.C. section 360bbb-3(b)(1), unless the authorization is terminated or revoked sooner.  Performed at [facility], [place] [address] [place], [place] [address]          Radiology Studies: No results found.      Scheduled Meds:  enoxaparin (LOVENOX) injection  40 mg Subcutaneous Q24H   irbesartan  150 mg Oral Daily   lipase/protease/amylase  72,000 Units Oral TID WC   pantoprazole (PROTONIX) IV  40 mg Intravenous Q12H   sucralfate  1 g Oral TID WC & HS   Continuous Infusions:  dextrose 5 % and 0.9 % NaCl with KCl 40 mEq/L 75 mL/hr at 12/28/20 1255   promethazine (PHENERGAN) injection (IM or IVPB) Stopped (12/27/20 2152)     LOS: 4 days    Time spent: 30 minutes.    Barb Merino, MD [facility] Pager [phone]

## 2020-12-28 NOTE — Progress Notes (Signed)
          Daily Rounding Note  12/28/2020, 12:37 PM  LOS: 4 days   SUBJECTIVE:   Chief complaint: Nausea, vomiting, chest and upper abdominal pain. Overall doing much better.  Mostly tolerating full liquids but vomited a couple of hours after breakfast today.  He recognizes that at home, if he does not take his Creon before meals he will sometimes vomit.  The staff has been giving him the Creon after meals. Abdominal pain essentially resolved.  No resting nausea.  OBJECTIVE:         Vital signs in last 24 hours:    Temp:  [98.2 F (36.8 C)-98.5 F (36.9 C)] 98.5 F (36.9 C) (09/14 1203) Pulse Rate:  [68-74] 69 (09/14 1203) Resp:  [16-18] 16 (09/14 1203) BP: (141-173)/(74-102) 149/102 (09/14 1203) SpO2:  [98 %-100 %] 99 % (09/14 1203) Last BM Date: 12/26/20 There were no vitals filed for this visit. General: Looks well. Heart: RRR. Chest: Clear without labored breathing. Abdomen: Soft.  Minor upper abdominal tenderness without guarding or rebound.  Active bowel sounds Extremities: No CCE. Neuro/Psych: Calm, pleasant as always.  No gross deficits, tremors.  Fully alert and oriented.  Intake/Output from previous day: 09/13 0701 - 09/14 0700 In: 1746.6 [P.O.:840; I.V.:856.6; IV Piggyback:50] Out: -   Intake/Output this shift: No intake/output data recorded.  Lab Results: Recent Labs    12/28/20 0232  WBC 4.8  HGB 12.1*  HCT 36.3*  PLT 195   BMET Recent Labs    12/26/20 0628 12/28/20 0232  NA 136 135  K 4.0 4.2  CL 103 102  CO2 28 28  GLUCOSE 113* 121*  BUN <5* <5*  CREATININE 1.11 1.10  CALCIUM 9.0 9.0   LFT Recent Labs    12/26/20 0628 12/28/20 0232  PROT 6.7 6.6  ALBUMIN 3.1* 3.1*  AST 27 28  ALT 19 24  ALKPHOS 46 52  BILITOT 1.2 0.5   PT/INR No results for input(s): LABPROT, INR in the last 72 hours. Hepatitis Panel No results for input(s): HEPBSAG, HCVAB, HEPAIGM, HEPBIGM in the last 72  hours.  Studies/Results: No results found.  ASSESMENT:   Chest, upper abdominal pain, resolved.  Nausea, bilious emesis improved but episode this morning which patient attributes to not getting timely administration of Creon before meal.    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  S/p 08/19/2020 ERCP, biliary and pancreatic sphincterotomy, placement of plastic biliary stent to pancreatic duct for ?  Sphincter of Oddi dysfunction.  Plastic pancreatic stent removed and stones removed from minor pancreatic duct (from retained pancreatic stent) at subsequent 11/02/2020 ERCP/EGD.   Lipase 102 >> 62.  Not convinced he is having another bout with a acute pancreatitis   PLAN   Messaged nursing staff to alert him of need to administer Creon at least 10 minutes before meals.  Advance diet from full liquids to soft diet as tolerated.    Kyle Berry  12/28/2020, 12:37 PM Phone [phone]

## 2020-12-29 LAB — COMPREHENSIVE METABOLIC PANEL
ALT: 23 U/L (ref 0–44)
AST: 25 U/L (ref 15–41)
Albumin: 2.8 g/dL — ABNORMAL LOW (ref 3.5–5.0)
Alkaline Phosphatase: 57 U/L (ref 38–126)
Anion gap: 5 (ref 5–15)
BUN: <5 mg/dL — ABNORMAL LOW (ref 6–20)
CO2: 27 mmol/L (ref 22–32)
Calcium: 9.1 mg/dL (ref 8.9–10.3)
Chloride: 105 mmol/L (ref 98–111)
Creatinine, Ser: 1.26 mg/dL — ABNORMAL HIGH (ref 0.61–1.24)
GFR, Estimated: >60 mL/min (ref >60)
Glucose, Bld: 136 mg/dL — ABNORMAL HIGH (ref 70–99)
Potassium: 4.1 mmol/L (ref 3.5–5.1)
Sodium: 137 mmol/L (ref 135–145)
Total Bilirubin: 0.5 mg/dL (ref 0.3–1.2)
Total Protein: 5.9 g/dL — ABNORMAL LOW (ref 6.5–8.1)

## 2020-12-29 LAB — PHOSPHORUS: Phosphorus: 3.3 mg/dL (ref 2.5–4.6)

## 2020-12-29 LAB — CBC WITH DIFFERENTIAL/PLATELET
Abs Immature Granulocytes: 0.01 10*3/uL (ref 0.00–0.07)
Basophils Absolute: 0 10*3/uL (ref 0.0–0.1)
Basophils Relative: 0 %
Eosinophils Absolute: 0.1 10*3/uL (ref 0.0–0.5)
Eosinophils Relative: 2 %
HCT: 33.4 % — ABNORMAL LOW (ref 39.0–52.0)
Hemoglobin: 11.2 g/dL — ABNORMAL LOW (ref 13.0–17.0)
Immature Granulocytes: 0 %
Lymphocytes Relative: 49 %
Lymphs Abs: 2.3 10*3/uL (ref 0.7–4.0)
MCH: 33.3 pg (ref 26.0–34.0)
MCHC: 33.5 g/dL (ref 30.0–36.0)
MCV: 99.4 fL (ref 80.0–100.0)
Monocytes Absolute: 0.5 10*3/uL (ref 0.1–1.0)
Monocytes Relative: 10 %
Neutro Abs: 1.8 10*3/uL (ref 1.7–7.7)
Neutrophils Relative %: 39 %
Platelets: 196 10*3/uL (ref 150–400)
RBC: 3.36 MIL/uL — ABNORMAL LOW (ref 4.22–5.81)
RDW: 14.8 % (ref 11.5–15.5)
WBC: 4.7 10*3/uL (ref 4.0–10.5)
nRBC: 0 % (ref 0.0–0.2)

## 2020-12-29 LAB — LIPASE, BLOOD: Lipase: 64 U/L — ABNORMAL HIGH (ref 11–51)

## 2020-12-29 LAB — MAGNESIUM: Magnesium: 1.7 mg/dL (ref 1.7–2.4)

## 2020-12-29 MED ORDER — MAGNESIUM SULFATE 2 GM/50ML IV SOLN
2.0000 g | Freq: Once | INTRAVENOUS | Status: AC
  Administered 2020-12-29: 2 g via INTRAVENOUS
  Filled 2020-12-29: qty 50

## 2020-12-29 MED ORDER — OXYCODONE HCL 5 MG PO TABS
10.0000 mg | ORAL_TABLET | Freq: Four times a day (QID) | ORAL | Status: DC | PRN
  Administered 2020-12-29 – 2020-12-31 (×4): 10 mg via ORAL
  Filled 2020-12-29 (×5): qty 2

## 2020-12-29 NOTE — Progress Notes (Signed)
          Daily Rounding Note  12/29/2020, 4:25 PM  LOS: 5 days   SUBJECTIVE:   Chief complaint: Nausea, vomiting, chest and upper abdominal pain.  Did not vomit yesterday but did have some nausea. Able to eat some food. Phenergan has been helping some with the nausea  OBJECTIVE:         Vital signs in last 24 hours:    Temp:  [97.6 F (36.4 C)-98.8 F (37.1 C)] 98.4 F (36.9 C) (09/15 1228) Pulse Rate:  [65-75] 75 (09/15 1228) Resp:  [16-18] 18 (09/15 1228) BP: (137-152)/(89-98) 137/89 (09/15 1228) SpO2:  [97 %-98 %] 98 % (09/15 1228) Last BM Date: 12/28/20 There were no vitals filed for this visit. General: Looks fairly well. Heart: Regular rate Chest: No increased WOB Abdomen: Soft. Still has some tenderness to palpation in the upper abdomen Extremities: No CCE Neuro/Psych: Respectful and pleasant. Alert  Intake/Output from previous day: 09/14 0701 - 09/15 0700 In: 2613 [I.V.:2613] Out: -   Intake/Output this shift: No intake/output data recorded.  Lab Results: Recent Labs    12/28/20 0232 12/29/20 0502  WBC 4.8 4.7  HGB 12.1* 11.2*  HCT 36.3* 33.4*  PLT 195 196   BMET Recent Labs    12/28/20 0232 12/29/20 0502  NA 135 137  K 4.2 4.1  CL 102 105  CO2 28 27  GLUCOSE 121* 136*  BUN <5* <5*  CREATININE 1.10 1.26*  CALCIUM 9.0 9.1   LFT Recent Labs    12/28/20 0232 12/29/20 0502  PROT 6.6 5.9*  ALBUMIN 3.1* 2.8*  AST 28 25  ALT 24 23  ALKPHOS 52 57  BILITOT 0.5 0.5   PT/INR No results for input(s): LABPROT, INR in the last 72 hours. Hepatitis Panel No results for input(s): HEPBSAG, HCVAB, HEPAIGM, HEPBIGM in the last 72 hours.  Studies/Results: No results found.  ASSESMENT:   Upper abdominal pain, N&V. Slightly improved since admission. Attempting to tolerate more PO intake    Hx recurrent bouts of idiopathic pancreatitis with established diagnosis of incomplete pancreas divisum.  Not clear whether or not he is having an episode of acute pancreatitis since patient's recent CT scans have all shown signs of acute pancreatitis.   Gastroparesis. Confirmed on GES previously  Gastritis. Seen on last ERCP 11/02/20.   PLAN   Advance to low fat gastroparesis diet as tolerated, will need to time Creon before eating If does not tolerate diet above, then consider postpyloric tube feeds  Christia Reading, MD

## 2020-12-29 NOTE — Progress Notes (Signed)
PROGRESS NOTE    Kyle Berry  [id number] DOB: [birth year] DOA: 12/23/2020 PCP: [name] (Inactive)    Brief Narrative:  50 year old gentleman with history of chronic recurrent pancreatitis, history of pancreatic divisum status post ERCP, biliary and pancreatic sphincterectomy and placement of plastic biliary stent to pancreatic duct presented to the emergency room with another bout of acute epigastric pain and treated as pancreatitis.   Assessment & Plan:   Principal Problem:   Acute on chronic pancreatitis [facility]) Active Problems:   Essential hypertension   High cholesterol  Acute on chronic recurrent pancreatitis: Acute on chronic abdominal pain. Initially elevated lipase 102 that is trending down and normalized.  No evidence of other complications. Still using IV pain medications due to ongoing pain.  He is tolerating liquid diet. Patient will probably need chronic pain management. Will start patient on oral oxycodone and taper off IV Dilaudid as most possible. If persistent pain and not tolerating diet on oral pain medications, will need NG tube feeding. Continue IV fluids.  Continue adequate IV and oral opiates for pain relief as well as nausea medications.  Followed by gastroenterology.  Do not anticipate any surgical procedure.  Essential hypertension: Blood pressures stabilized on home doses of irbesartan.  Hypokalemia: Repleted and adequate.  Hypomagnesemia: Repleted and adequate.  Further replaced today.   DVT prophylaxis: enoxaparin (LOVENOX) injection 40 mg Start: 12/23/20 1330   Code Status: Full code Family Communication: None Disposition Plan: Status is: Inpatient  Remains inpatient appropriate because:IV treatments appropriate due to intensity of illness or inability to take PO and Inpatient level of care appropriate due to severity of illness  Dispo:  Patient From: Home  Planned Disposition: Home  Medically stable for discharge: No            Consultants:  Gastroenterology  Procedures:  None  Antimicrobials:  None   Subjective: Patient seen and examined.  Denies any nausea or vomiting.  Continues to have episodic epigastric pain.  Kind of frustrated with ongoing pain but he realizes that his pancreas may cause long-term pain problems. He is eager to try oral pain medications.  Objective: Vitals:   12/28/20 1203 12/28/20 1816 12/29/20 0002 12/29/20 0600  BP: (!) 149/102 (!) 152/91 (!) 150/98 (!) 142/97  Pulse: 69 71 69 65  Resp: '16 16 18 18  '$ Temp: 98.5 F (36.9 C) 98.8 F (37.1 C) 98.4 F (36.9 C) 97.6 F (36.4 C)  TempSrc: Oral Oral Oral Oral  SpO2: 99% 98% 97% 97%    Intake/Output Summary (Last 24 hours) at 12/29/2020 1101 Last data filed at 12/29/2020 0600 Gross per 24 hour  Intake 2613 ml  Output --  Net 2613 ml   There were no vitals filed for this visit.  Examination:  General exam: Appears calm and comfortable, looks comfortable. Respiratory system: Clear to auscultation. Respiratory effort normal. Cardiovascular system: S1 & S2 heard, RRR. No JVD, murmurs, rubs, gallops or clicks. No pedal edema. Gastrointestinal system: Soft.  Minimal tenderness in the epigastrium.  No rigidity or guarding. Central nervous system: Alert and oriented. No focal neurological deficits. Extremities: Symmetric 5 x 5 power. Skin: No rashes, lesions or ulcers Psychiatry: Judgement and insight appear normal. Mood & affect appropriate.     Data Reviewed: I have personally reviewed following labs and imaging studies  CBC: Recent Labs  Lab 12/23/20 0440 12/24/20 0600 12/28/20 0232 12/29/20 0502  WBC 6.5 5.9 4.8 4.7  NEUTROABS  --   --   --  1.8  HGB 13.6 13.0 12.1* 11.2*  HCT 39.9 36.7* 36.3* 33.4*  MCV 97.3 94.6 100.8* 99.4  PLT 198 171 195 [id number]   Basic Metabolic Panel: Recent Labs  Lab 12/23/20 0440 12/24/20 0600 12/24/20 0830 12/26/20 0628 12/28/20 0232 12/29/20 0502  NA 134* 134*  --   136 135 137  K 3.6 3.2*  --  4.0 4.2 4.1  CL 100 100  --  103 102 105  CO2 20* 26  --  '28 28 27  '$ GLUCOSE 112* 140*  --  113* 121* 136*  BUN 11 <5*  --  <5* <5* <5*  CREATININE 1.06 1.00  --  1.11 1.10 1.26*  CALCIUM 9.2 8.8*  --  9.0 9.0 9.1  MG  --   --  1.6*  --   --  1.7  PHOS  --   --  3.3  --   --  3.3   GFR: CrCl cannot be calculated (Unknown ideal weight.). Liver Function Tests: Recent Labs  Lab 12/23/20 0652 12/24/20 0600 12/26/20 0628 12/28/20 0232 12/29/20 0502  AST 47* '25 27 28 25  '$ ALT '31 21 19 24 23  '$ ALKPHOS 75 55 46 52 57  BILITOT 0.8 1.3* 1.2 0.5 0.5  PROT 7.8 6.7 6.7 6.6 5.9*  ALBUMIN 4.0 3.3* 3.1* 3.1* 2.8*   Recent Labs  Lab 12/23/20 0652 12/26/20 0628 12/29/20 0502  LIPASE 102* 62* 64*   No results for input(s): AMMONIA in the last 168 hours. Coagulation Profile: No results for input(s): INR, PROTIME in the last 168 hours. Cardiac Enzymes: No results for input(s): CKTOTAL, CKMB, CKMBINDEX, TROPONINI in the last 168 hours. BNP (last 3 results) No results for input(s): PROBNP in the last 8760 hours. HbA1C: No results for input(s): HGBA1C in the last 72 hours. CBG: No results for input(s): GLUCAP in the last 168 hours. Lipid Profile: No results for input(s): CHOL, HDL, LDLCALC, TRIG, CHOLHDL, LDLDIRECT in the last 72 hours. Thyroid Function Tests: Recent Labs    12/27/20 0505  TSH 1.286   Anemia Panel: No results for input(s): VITAMINB12, FOLATE, FERRITIN, TIBC, IRON, RETICCTPCT in the last 72 hours. Sepsis Labs: No results for input(s): PROCALCITON, LATICACIDVEN in the last 168 hours.  Recent Results (from the past 240 hour(s))  SARS CORONAVIRUS 2 (TAT 6-24 HRS) Nasopharyngeal Nasopharyngeal Swab     Status: None   Collection Time: 12/23/20  4:42 PM   Specimen: Nasopharyngeal Swab  Result Value Ref Range Status   SARS Coronavirus 2 NEGATIVE NEGATIVE Final    Comment: (NOTE) SARS-CoV-2 target nucleic acids are NOT DETECTED.  The  SARS-CoV-2 RNA is generally detectable in upper and lower respiratory specimens during the acute phase of infection. Negative results do not preclude SARS-CoV-2 infection, do not rule out co-infections with other pathogens, and should not be used as the sole basis for treatment or other patient management decisions. Negative results must be combined with clinical observations, patient history, and epidemiological information. The expected result is Negative.  Fact Sheet for Patients: SugarRoll.be  Fact Sheet for Healthcare Providers: https://www.woods-mathews.com/  This test is not yet approved or cleared by the [place] FDA and  has been authorized for detection and/or diagnosis of SARS-CoV-2 by FDA under an Emergency Use Authorization (EUA). This EUA will remain  in effect (meaning this test can be used) for the duration of the COVID-19 declaration under Se ction 564(b)(1) of the Act, 21 U.S.C. section 360bbb-3(b)(1), unless the authorization is terminated or revoked sooner.  Performed at [facility], [place] [address], [place], [place] [address]          Radiology Studies: No results found.      Scheduled Meds:  enoxaparin (LOVENOX) injection  40 mg Subcutaneous Q24H   irbesartan  150 mg Oral Daily   lipase/protease/amylase  72,000 Units Oral TID WC   pantoprazole (PROTONIX) IV  40 mg Intravenous Q12H   sucralfate  1 g Oral TID WC & HS   Continuous Infusions:  dextrose 5 % and 0.9 % NaCl with KCl 40 mEq/L 75 mL/hr at 12/29/20 0600   magnesium sulfate bolus IVPB     promethazine (PHENERGAN) injection (IM or IVPB) Stopped (12/27/20 2152)     LOS: 5 days    Time spent: 30 minutes.    Barb Merino, MD [facility] Pager [phone]

## 2020-12-30 DIAGNOSIS — E440 Moderate protein-calorie malnutrition: Secondary | ICD-10-CM | POA: Insufficient documentation

## 2020-12-30 LAB — COMPREHENSIVE METABOLIC PANEL
ALT: 24 U/L (ref 0–44)
AST: 24 U/L (ref 15–41)
Albumin: 2.9 g/dL — ABNORMAL LOW (ref 3.5–5.0)
Alkaline Phosphatase: 50 U/L (ref 38–126)
Anion gap: 12 (ref 5–15)
BUN: 6 mg/dL (ref 6–20)
CO2: 26 mmol/L (ref 22–32)
Calcium: 9.4 mg/dL (ref 8.9–10.3)
Chloride: 100 mmol/L (ref 98–111)
Creatinine, Ser: 1.16 mg/dL (ref 0.61–1.24)
GFR, Estimated: >60 mL/min (ref >60)
Glucose, Bld: 133 mg/dL — ABNORMAL HIGH (ref 70–99)
Potassium: 4.4 mmol/L (ref 3.5–5.1)
Sodium: 138 mmol/L (ref 135–145)
Total Bilirubin: 0.5 mg/dL (ref 0.3–1.2)
Total Protein: 6.1 g/dL — ABNORMAL LOW (ref 6.5–8.1)

## 2020-12-30 LAB — LIPASE, BLOOD: Lipase: 57 U/L — ABNORMAL HIGH (ref 11–51)

## 2020-12-30 MED ORDER — ENSURE ENLIVE PO LIQD
237.0000 mL | Freq: Three times a day (TID) | ORAL | Status: DC
  Administered 2020-12-30: 237 mL via ORAL

## 2020-12-30 MED ORDER — PANCRELIPASE (LIP-PROT-AMYL) 36000-114000 UNITS PO CPEP
36000.0000 [IU] | ORAL_CAPSULE | Freq: Three times a day (TID) | ORAL | Status: DC | PRN
  Filled 2020-12-30: qty 1

## 2020-12-30 MED ORDER — ADULT MULTIVITAMIN W/MINERALS CH
1.0000 | ORAL_TABLET | Freq: Every day | ORAL | Status: DC
  Administered 2020-12-31: 1 via ORAL
  Filled 2020-12-30: qty 1

## 2020-12-30 NOTE — Progress Notes (Signed)
PROGRESS NOTE    KERION WIEBELHAUS  [id number] DOB: 25-May-1970 DOA: 12/23/2020 PCP: [name] (Inactive)    Brief Narrative:  50 year old gentleman with history of chronic recurrent pancreatitis, history of pancreatic divisum status post ERCP, biliary and pancreatic sphincterectomy and placement of plastic biliary stent to pancreatic duct presented to the emergency room with another bout of acute epigastric pain and treated as pancreatitis.   Assessment & Plan:   Principal Problem:   Acute on chronic pancreatitis [facility]) Active Problems:   Essential hypertension   High cholesterol  Acute on chronic recurrent pancreatitis: Acute on chronic abdominal pain. Initially elevated lipase 102 that is trending down and normalized.  No evidence of other complications. Still using IV pain medications due to ongoing pain.  He is tolerating liquid diet. Patient will probably need chronic pain management. Encouraged to use oral oxycodone and taper off IV Dilaudid.   Advance diet to soft diet today.   On IV fluids and PPI.  Essential hypertension: Blood pressures stabilized on home doses of irbesartan.  Hypokalemia: Repleted and adequate.  Hypomagnesemia: Repleted and adequate.    Mobilize.  Challenge with oral pain medications.   DVT prophylaxis: enoxaparin (LOVENOX) injection 40 mg Start: 12/23/20 1330   Code Status: Full code Family Communication: None Disposition Plan: Status is: Inpatient  Remains inpatient appropriate because:IV treatments appropriate due to intensity of illness or inability to take PO and Inpatient level of care appropriate due to severity of illness  Dispo:  Patient From: Home  Planned Disposition: Home  Medically stable for discharge: No       Consultants:  Gastroenterology  Procedures:  None  Antimicrobials:  None   Subjective: Patient seen and examined.  Intermittent pain remains.  He had tried 1 dose of oxycodone yesterday and  vomited after that so went back on Dilaudid IV.  He thinks he can tolerate soft diet.  He is agreeable to try oral pain medication today. We discussed about transitioning off from injectables to oral opiates and he agrees.  Objective: Vitals:   12/29/20 1706 12/29/20 2322 12/30/20 0512 12/30/20 1150  BP:  (!) 148/95 (!) 144/86 (!) 160/97  Pulse: 61 70 61 (!) 58  Resp: '18 18 18 18  '$ Temp: 97.7 F (36.5 C) 98.4 F (36.9 C) 98.6 F (37 C) 97.6 F (36.4 C)  TempSrc: Oral Oral Oral Oral  SpO2: 98% 100% 97% 100%    Intake/Output Summary (Last 24 hours) at 12/30/2020 1255 Last data filed at 12/30/2020 1230 Gross per 24 hour  Intake 2790.8 ml  Output --  Net 2790.8 ml   There were no vitals filed for this visit.  Examination: General: Looks comfortable.  On room air.  Pleasant to conversation. Cardiovascular: S1-S2 normal.  No added sounds. Respiratory: Bilateral clear.  No added sounds. Gastrointestinal: Soft.  Mildly tender around the epigastrium.  No rigidity or guarding.  Bowel sounds present. Ext: No edema.  No swelling.   Data Reviewed: I have personally reviewed following labs and imaging studies  CBC: Recent Labs  Lab 12/24/20 0600 12/28/20 0232 12/29/20 0502  WBC 5.9 4.8 4.7  NEUTROABS  --   --  1.8  HGB 13.0 12.1* 11.2*  HCT 36.7* 36.3* 33.4*  MCV 94.6 100.8* 99.4  PLT 171 195 [id number]   Basic Metabolic Panel: Recent Labs  Lab 12/24/20 0600 12/24/20 0830 12/26/20 0628 12/28/20 0232 12/29/20 0502 12/30/20 0216  NA 134*  --  136 135 137 138  K 3.2*  --  4.0 4.2 4.1 4.4  CL 100  --  103 102 105 100  CO2 26  --  '28 28 27 26  '$ GLUCOSE 140*  --  113* 121* 136* 133*  BUN <5*  --  <5* <5* <5* 6  CREATININE 1.00  --  1.11 1.10 1.26* 1.16  CALCIUM 8.8*  --  9.0 9.0 9.1 9.4  MG  --  1.6*  --   --  1.7  --   PHOS  --  3.3  --   --  3.3  --    GFR: CrCl cannot be calculated (Unknown ideal weight.). Liver Function Tests: Recent Labs  Lab 12/24/20 0600  12/26/20 0628 12/28/20 0232 12/29/20 0502 12/30/20 0216  AST '25 27 28 25 24  '$ ALT '21 19 24 23 24  '$ ALKPHOS 55 46 52 57 50  BILITOT 1.3* 1.2 0.5 0.5 0.5  PROT 6.7 6.7 6.6 5.9* 6.1*  ALBUMIN 3.3* 3.1* 3.1* 2.8* 2.9*   Recent Labs  Lab 12/26/20 0628 12/29/20 0502 12/30/20 0216  LIPASE 62* 64* 57*   No results for input(s): AMMONIA in the last 168 hours. Coagulation Profile: No results for input(s): INR, PROTIME in the last 168 hours. Cardiac Enzymes: No results for input(s): CKTOTAL, CKMB, CKMBINDEX, TROPONINI in the last 168 hours. BNP (last 3 results) No results for input(s): PROBNP in the last 8760 hours. HbA1C: No results for input(s): HGBA1C in the last 72 hours. CBG: No results for input(s): GLUCAP in the last 168 hours. Lipid Profile: No results for input(s): CHOL, HDL, LDLCALC, TRIG, CHOLHDL, LDLDIRECT in the last 72 hours. Thyroid Function Tests: No results for input(s): TSH, T4TOTAL, FREET4, T3FREE, THYROIDAB in the last 72 hours.  Anemia Panel: No results for input(s): VITAMINB12, FOLATE, FERRITIN, TIBC, IRON, RETICCTPCT in the last 72 hours. Sepsis Labs: No results for input(s): PROCALCITON, LATICACIDVEN in the last 168 hours.  Recent Results (from the past 240 hour(s))  SARS CORONAVIRUS 2 (TAT 6-24 HRS) Nasopharyngeal Nasopharyngeal Swab     Status: None   Collection Time: 12/23/20  4:42 PM   Specimen: Nasopharyngeal Swab  Result Value Ref Range Status   SARS Coronavirus 2 NEGATIVE NEGATIVE Final    Comment: (NOTE) SARS-CoV-2 target nucleic acids are NOT DETECTED.  The SARS-CoV-2 RNA is generally detectable in upper and lower respiratory specimens during the acute phase of infection. Negative results do not preclude SARS-CoV-2 infection, do not rule out co-infections with other pathogens, and should not be used as the sole basis for treatment or other patient management decisions. Negative results must be combined with clinical observations, patient  history, and epidemiological information. The expected result is Negative.  Fact Sheet for Patients: SugarRoll.be  Fact Sheet for Healthcare Providers: https://www.woods-mathews.com/  This test is not yet approved or cleared by the [place] FDA and  has been authorized for detection and/or diagnosis of SARS-CoV-2 by FDA under an Emergency Use Authorization (EUA). This EUA will remain  in effect (meaning this test can be used) for the duration of the COVID-19 declaration under Se ction 564(b)(1) of the Act, 21 U.S.C. section 360bbb-3(b)(1), unless the authorization is terminated or revoked sooner.  Performed at [facility], [place] [address] [place], [place] [address]          Radiology Studies: No results found.      Scheduled Meds:  enoxaparin (LOVENOX) injection  40 mg Subcutaneous Q24H   irbesartan  150 mg Oral Daily   lipase/protease/amylase  72,000 Units Oral  TID WC   pantoprazole (PROTONIX) IV  40 mg Intravenous Q12H   sucralfate  1 g Oral TID WC & HS   Continuous Infusions:  dextrose 5 % and 0.9 % NaCl with KCl 40 mEq/L 75 mL/hr at 12/30/20 0457   promethazine (PHENERGAN) injection (IM or IVPB) Stopped (12/27/20 2152)     LOS: 6 days    Time spent: 30 minutes.    Barb Merino, MD [facility] Pager [phone]

## 2020-12-30 NOTE — Progress Notes (Signed)
          Daily Rounding Note  12/30/2020, 5:50 PM  LOS: 6 days   SUBJECTIVE:   Chief complaint: Nausea, vomiting, chest and upper abdominal pain.  Had some vomiting yesterday. Still having some ab pain but might be improved. Resistant to getting tube feeds.  OBJECTIVE:         Vital signs in last 24 hours:    Temp:  [97.6 F (36.4 C)-99 F (37.2 C)] 99 F (37.2 C) (09/16 1631) Pulse Rate:  [58-70] 65 (09/16 1631) Resp:  [18] 18 (09/16 1631) BP: (144-160)/(86-97) 157/97 (09/16 1631) SpO2:  [97 %-100 %] 98 % (09/16 1631) Last BM Date: 12/28/20 There were no vitals filed for this visit. General: Looks fairly well. Heart: Regular rate Chest: No increased WOB Abdomen: Soft. Still has some tenderness to palpation in the upper abdomen Extremities: No CCE Neuro/Psych: Respectful and pleasant. Alert  Intake/Output from previous day: 09/15 0701 - 09/16 0700 In: 1266.8 [P.O.:480; I.V.:786.8] Out: -   Intake/Output this shift: Total I/O In: 2331.6 [P.O.:720; I.V.:1611.6] Out: -   Lab Results: Recent Labs    12/28/20 0232 12/29/20 0502  WBC 4.8 4.7  HGB 12.1* 11.2*  HCT 36.3* 33.4*  PLT 195 196   BMET Recent Labs    12/28/20 0232 12/29/20 0502 12/30/20 0216  NA 135 137 138  K 4.2 4.1 4.4  CL 102 105 100  CO2 '28 27 26  '$ GLUCOSE 121* 136* 133*  BUN <5* <5* 6  CREATININE 1.10 1.26* 1.16  CALCIUM 9.0 9.1 9.4   LFT Recent Labs    12/28/20 0232 12/29/20 0502 12/30/20 0216  PROT 6.6 5.9* 6.1*  ALBUMIN 3.1* 2.8* 2.9*  AST '28 25 24  '$ ALT '24 23 24  '$ ALKPHOS 52 57 50  BILITOT 0.5 0.5 0.5   PT/INR No results for input(s): LABPROT, INR in the last 72 hours. Hepatitis Panel No results for input(s): HEPBSAG, HCVAB, HEPAIGM, HEPBIGM in the last 72 hours.  Studies/Results: No results found.  ASSESMENT:   Upper abdominal pain, N&V. Slightly improved since admission. Attempting to tolerate more PO intake    Hx  recurrent bouts of idiopathic pancreatitis with established diagnosis of incomplete pancreas divisum. Not clear whether or not he is having an episode of acute pancreatitis since patient's recent CT scans have all shown signs of acute pancreatitis.   Gastroparesis. Confirmed on GES previously  Gastritis. Seen on last ERCP 11/02/20.   PLAN   Advance to low fat gastroparesis diet as tolerated, will need to time Creon before eating If does not tolerate diet above, then consider postpyloric tube feeds, though patient is resistant at this time Continue antiemetics  The inpatient GI will be available be available over the weekend if questions arise.  Christia Reading, MD

## 2020-12-30 NOTE — Plan of Care (Signed)

## 2020-12-30 NOTE — Progress Notes (Signed)
Patient verbalizes that he do not want any gatric tube. RN notified both Dr. Sloan Leiter and Dr.Dorsey.

## 2020-12-30 NOTE — Progress Notes (Signed)
Patient declined noon medication as well as his oral pain meds at this time.

## 2020-12-30 NOTE — Progress Notes (Signed)
Patient ate 100 percent of his dinner, denied nausea or pain at this time. Resting comfortably in bed.

## 2020-12-30 NOTE — Progress Notes (Signed)
Initial Nutrition Assessment  DOCUMENTATION CODES:   Non-severe (moderate) malnutrition in context of chronic illness  INTERVENTION:   -Ensure Enlive po TID, each supplement provides 350 kcal and 20 grams of protein  -MVI with minerals daily -Educated pt on gastroparesis diet; attached "Gastroparesis Nutrition Therapy" handout from [facility] Nutrition Care Manual -If post-pyloric TF are warranted, recommend (cortrak team's next service date is Monday, 01/02/21):  Initiate Vital AF 1.2 @ 80 ml/hr and increase by 10 ml every 4 hours to goal rate of 80 ml/hr.   Tube feeding regimen provides 2304 kcal (96% of needs), 144 grams of protein, and 1557 ml of H2O.    NUTRITION DIAGNOSIS:   Moderate Malnutrition related to chronic illness (pancreatitis) as evidenced by mild fat depletion, moderate fat depletion, mild muscle depletion, moderate muscle depletion, percent weight loss.  GOAL:   Patient will meet greater than or equal to 90% of their needs  MONITOR:   PO intake, Supplement acceptance, Labs, Weight trends, Skin, I & O's  REASON FOR ASSESSMENT:   Consult Assessment of nutrition requirement/status, Diet education, Enteral/tube feeding initiation and management  ASSESSMENT:   50 year old gentleman with history of chronic recurrent pancreatitis, history of pancreatic divisum status post ERCP, biliary and pancreatic sphincterectomy and placement of plastic biliary stent to pancreatic duct presented to the emergency room with another bout of acute epigastric pain and treated as pancreatitis.  Pt admitted with acute on chronic pancreatitis.   Reviewed I/O's: +1.3 L x 24 hours and +11.7 L since admission  Spoke with pt at bedside, who is pleasant and in good spirits today. He reports a general decline in health over the past 3 months, when he underwent pancreatic stent placement at [facility]. Pt reports doing well after procedure, but started having problems (intermittent abdominal pain) once  stents were removed about 2 months ago. Per pt, pain became progressively worse over time and significantly impacted her intake for 1 week PTA. Per pt, he was able to eat up until 1 week PTA, but was not able to eat as much secondary to abdominal pain. Pt typically consumes 4-6 meals per day (Breakfast, Lunch, Dinner: meat, starch, and vegetable; Snacks: fruit or baked potato chips). Pt tries to focus on lean proteins and drinks 1-2 supplements per day (Ensure or Boost Breeze).   Pt shares that he tolerated full liquid diet toiday without difficulty or pain. He is eager to try solid foods, as he is getting tired of the options on the full liquid diet.   Per pt, his UBW is around 180# and estimates he has lost about 15# within the past 3 months. Reviewed wt hx; pt has experienced a 8.1% wt loss over the past 6 weeks, which is significant for time frame.   Discussed with pt gastroparesis diet recommendations. Explained gastroparesis pathophysiology. Encouraged pt to consume small, frequent meals to stimulate gastric emptying. Also discouraged high fat foods, which delay absorption. Also encouraged continued use of oral nutrition supplements. Per pt., he is followed by an RD at [facility] and was able to teach back information to this RD. He had no further questions for this RD and was very appreciative of visit.   ADDENDUM (1350): Received consult from Dr. Sloan Leiter regarding TF initiation. Case discussed with MD to clarify plan. RD recommend beside tube placement by cortrak tube team, which MD agreed to. Obtained ordered for cortrak placement and informed cortrak RD. Per RN and cortrak RD, pt is refusing tube placement. Hospitalist and GI have spoken  with pt. Plan to hold off on tube placement for now, as pt still has not tried solid foods. RD will place TF recommendations and follow for need to pursue TF. Cortrak tube team will be available Monday to place tube if needed. If TF are initiated, recommend trialing a  semi-elemental formula (Vital 1.2) which contains peptide based protein system, MCT/ fish oil structured lipids, and scFOS to promote tolerance ans absorption.   Medications reviewed and include creon and dextrose 5% and 0.9% NaCl with KCl mEq/L infusion @ 75 ml/hr.  Labs reviewed.   NUTRITION - FOCUSED PHYSICAL EXAM:  Flowsheet Row Most Recent Value  Orbital Region No depletion  Upper Arm Region Mild depletion  Thoracic and Lumbar Region Moderate depletion  Buccal Region No depletion  Temple Region No depletion  Clavicle Bone Region Mild depletion  Clavicle and Acromion Bone Region Mild depletion  Scapular Bone Region Mild depletion  Dorsal Hand Moderate depletion  Patellar Region Moderate depletion  Anterior Thigh Region Moderate depletion  Posterior Calf Region Moderate depletion  Edema (RD Assessment) None  Hair Reviewed  Eyes Reviewed  Mouth Reviewed  Skin Reviewed  Nails Reviewed       Diet Order:   Diet Order             DIET SOFT Room service appropriate? Yes; Fluid consistency: Thin  Diet effective now                   EDUCATION NEEDS:   Education needs have been addressed  Skin:  Skin Assessment: Reviewed RN Assessment  Last BM:  12/28/20  Height:   Ht Readings from Last 1 Encounters:  12/14/20 '6\' 1"'$  (1.854 m)    Weight:   Wt Readings from Last 1 Encounters:  12/14/20 75.1 kg    Ideal Body Weight:  83.6 kg  BMI:  There is no height or weight on file to calculate BMI.  Estimated Nutritional Needs:   Kcal:  2400-2600  Protein:  125-150 grams  Fluid:  > 2 L    Loistine Chance, RD, LDN, [place] Registered Dietitian II Certified Diabetes Care and Education Specialist Please refer to [facility] for RD and/or RD on-call/weekend/after hours pager

## 2020-12-30 NOTE — Progress Notes (Signed)
Cortrak Tube Team Note:  Consult received to place a Cortrak feeding tube.   RD went to discuss Cortrak tube placement with patient. Patient reports he has only attempted liquid diet. States he has skipped meals because he is "tired of" eating soups, jello, and pudding. He requests to try SOFT diet this afternoon as he thinks he can tolerate. Discussed with internal medicine and GI. Plan to hold off to see is he tolerates and if not, possible Cortrak Monday. Cortrak order discontinued for now.   If further nutrition issues arise, please re-consult RD/Cortrak team.   Mariana Single MS, RD, LDN, CNSC Clinical Nutrition Pager listed in [place]

## 2020-12-31 LAB — COMPREHENSIVE METABOLIC PANEL
ALT: 24 U/L (ref 0–44)
AST: 23 U/L (ref 15–41)
Albumin: 3.1 g/dL — ABNORMAL LOW (ref 3.5–5.0)
Alkaline Phosphatase: 52 U/L (ref 38–126)
Anion gap: 5 (ref 5–15)
BUN: 7 mg/dL (ref 6–20)
CO2: 27 mmol/L (ref 22–32)
Calcium: 9.5 mg/dL (ref 8.9–10.3)
Chloride: 104 mmol/L (ref 98–111)
Creatinine, Ser: 1.22 mg/dL (ref 0.61–1.24)
GFR, Estimated: >60 mL/min (ref >60)
Glucose, Bld: 127 mg/dL — ABNORMAL HIGH (ref 70–99)
Potassium: 4.5 mmol/L (ref 3.5–5.1)
Sodium: 136 mmol/L (ref 135–145)
Total Bilirubin: 0.5 mg/dL (ref 0.3–1.2)
Total Protein: 6.3 g/dL — ABNORMAL LOW (ref 6.5–8.1)

## 2020-12-31 LAB — LIPASE, BLOOD: Lipase: 54 U/L — ABNORMAL HIGH (ref 11–51)

## 2020-12-31 MED ORDER — OXYCODONE-ACETAMINOPHEN 5-325 MG PO TABS: 1.0000 | ORAL_TABLET | Freq: Three times a day (TID) | ORAL | 0 refills | Status: AC | PRN

## 2020-12-31 MED ORDER — PANCRELIPASE (LIP-PROT-AMYL) 36000-114000 UNITS PO CPEP: 72000.0000 [IU] | ORAL_CAPSULE | Freq: Three times a day (TID) | ORAL | 0 refills | Status: DC

## 2020-12-31 NOTE — Discharge Summary (Signed)
Physician Discharge Summary  Kyle Berry [id number] DOB: Nov 14, 1970 DOA: 12/23/2020  PCP: Kyle Berry (Inactive)  Admit date: 12/23/2020 Discharge date: 12/31/2020  Admitted From: Home Disposition: Home  Recommendations for Outpatient Follow-up:  Follow up with PCP in 1-2 weeks Schedule follow-up with gastroenterology. Home Health: Not applicable Equipment/Devices: Not applicable  Discharge Condition: Stable CODE STATUS: Full code Diet recommendation: Soft.  Low-salt diet  Discharge summary:  50 year old gentleman with history of chronic recurrent pancreatitis, history of pancreatic divisum status post ERCP, biliary and pancreatic sphincterectomy and placement of plastic biliary stent to pancreatic duct presented to the emergency room with another bout of acute epigastric pain and treated as pancreatitis.  Acute on chronic abdominal pain with suspected recurrent pancreatitis: Initially elevated lipase 102, treated with IV fluids, IV and oral opiates and nausea medications.  Prolonged abdominal pain and intolerance to diet however improved and mostly asymptomatic now. Normal bowel function.  Eating regular soft diet.  No nausea and very occasional abdominal pain but tolerable with oral pain medication. Seen and followed by gastroenterology. Electrolytes are replaced and adequate. Chronic medical issues are adequate.  Patient is asymptomatic today.  Tolerating regular diet.  Going home.  Short course of opiates were prescribed for intermittent pain.   Other chronic medical issues remained stable.  He will continue his cholesterol and blood pressure medications.    Discharge Diagnoses:  Principal Problem:   Acute on chronic pancreatitis [facility]) Active Problems:   Essential hypertension   High cholesterol   Malnutrition of moderate degree    Discharge Instructions  Discharge Instructions     Call MD for:  persistant nausea and vomiting   Complete by: As  directed    Call MD for:  severe uncontrolled pain   Complete by: As directed    Diet - low sodium heart healthy   Complete by: As directed    Increase activity slowly   Complete by: As directed       Allergies as of 12/31/2020       Reactions   Diclofenac Hives   Norvasc [amlodipine Besylate] Other (See Comments)   Fluid buildup in chest   Omeprazole Other (See Comments)   MD stopped due to pancreatitis   Prednisone Other (See Comments)   Mood swings        Medication List     TAKE these medications    famotidine 20 MG tablet Commonly known as: PEPCID Take 1 tablet (20 mg total) by mouth 2 (two) times daily. Instructions: Take Pepcid 20 mg twice daily x30 days, then take Pepcid 20 mg daily thereafter.   lipase/protease/amylase 36000 UNITS Cpep capsule Commonly known as: CREON Take 2 capsules (72,000 Units total) by mouth 3 (three) times daily with meals.   multivitamin with minerals Tabs tablet Take 1 tablet by mouth daily.   oxyCODONE-acetaminophen 5-325 MG tablet Commonly known as: PERCOCET/ROXICET Take 1 tablet by mouth every 8 (eight) hours as needed for up to 5 days for severe pain. What changed: when to take this   promethazine 25 MG tablet Commonly known as: PHENERGAN Take 25 mg by mouth every 6 (six) hours as needed for nausea or vomiting.   telmisartan 40 MG tablet Commonly known as: MICARDIS Take 40 mg by mouth daily.        Follow-up Information     Kyle Berry Follow up in 2 week(s).   Contact information: [facility] [place] [address] [phone]  Allergies  Allergen Reactions   Diclofenac Hives   Norvasc [Amlodipine Besylate] Other (See Comments)    Fluid buildup in chest   Omeprazole Other (See Comments)    MD stopped due to pancreatitis   Prednisone Other (See Comments)    Mood swings     Consultations: Gastroenterology   Procedures/Studies: DG Chest 2 View  Result Date: 12/23/2020 CLINICAL DATA:  Chest pain and shortness of breath. EXAM: CHEST - 2 VIEW COMPARISON:  09/13/2019 FINDINGS: The cardiac silhouette, mediastinal and hilar contours are normal. The lungs are clear. No pleural effusions. No pulmonary lesions. The bony thorax is intact. IMPRESSION: No acute cardiopulmonary findings. Electronically Signed   By: Marijo Sanes M.D.   On: 12/23/2020 05:31   CT ABDOMEN PELVIS W CONTRAST  Result Date: 12/23/2020 CLINICAL DATA:  Abdominal pain.  History of pancreatitis. EXAM: CT ABDOMEN AND PELVIS WITH CONTRAST TECHNIQUE: Multidetector CT imaging of the abdomen and pelvis was performed using the standard protocol following bolus administration of intravenous contrast. CONTRAST:  23m OMNIPAQUE IOHEXOL 350 MG/ML SOLN COMPARISON:  November 17, 2020. FINDINGS: Lower chest: No acute abnormality. Hepatobiliary: No focal liver abnormality is seen. Status post cholecystectomy. No biliary dilatation. Pancreas: Mild amount of fluid is noted around the pancreatic body and tail concerning for acute pancreatitis. No pseudocyst formation or ductal dilatation is noted. Spleen: Normal in size without focal abnormality. Adrenals/Urinary Tract: Adrenal glands appear normal. Severe right renal atrophy is noted. Nonobstructive calculus is noted in lower pole collecting system of left kidney. No hydronephrosis or renal obstruction is noted urinary bladder is unremarkable. Stomach/Bowel: Stomach is within normal limits. Appendix appears normal. No evidence of bowel wall thickening, distention, or inflammatory changes. Vascular/Lymphatic: No significant vascular findings are present. No enlarged abdominal or pelvic lymph nodes. Reproductive: Prostate is unremarkable. Other: No abdominal wall hernia or abnormality. No abdominopelvic ascites. Musculoskeletal: No acute or significant osseous findings. IMPRESSION:  Findings are consistent with mild acute pancreatitis without pseudocyst formation. Severe right renal atrophy is again noted. Nonobstructive left renal calculus is noted. Electronically Signed   By: JMarijo ConceptionM.D.   On: 12/23/2020 10:13   (Echo, Carotid, EGD, Colonoscopy, ERCP)    Subjective: Patient seen and examined.  He ate 100% of his dinner and 100% of his breakfast.  Did not use any nausea medicine for last 24 hours.  He did use oxycodone 3 times in last 24 hours.  Feels very comfortable go home.  Wants to go back to work Monday.   Discharge Exam: Vitals:   12/30/20 2303 12/31/20 0503  BP: 133/90 (!) 147/93  Pulse: 70 63  Resp: 18 17  Temp: 98.1 F (36.7 C) 98.3 F (36.8 C)  SpO2: 100% 100%   Vitals:   12/30/20 1150 12/30/20 1631 12/30/20 2303 12/31/20 0503  BP: (!) 160/97 (!) 157/97 133/90 (!) 147/93  Pulse: (!) 58 65 70 63  Resp: '18 18 18 17  '$ Temp: 97.6 F (36.4 C) 99 F (37.2 C) 98.1 F (36.7 C) 98.3 F (36.8 C)  TempSrc: Oral Oral Oral Oral  SpO2: 100% 98% 100% 100%    General: Pt is alert, awake, not in acute distress Cardiovascular: RRR, S1/S2 +, no rubs, no gallops Respiratory: CTA bilaterally, no wheezing, no rhonchi Abdominal: Soft, NT, ND, bowel sounds + Extremities: no edema, no cyanosis    The results of significant diagnostics from this hospitalization (including imaging, microbiology, ancillary and laboratory) are listed below for reference.     Microbiology: Recent  Results (from the past 240 hour(s))  SARS CORONAVIRUS 2 (TAT 6-24 HRS) Nasopharyngeal Nasopharyngeal Swab     Status: None   Collection Time: 12/23/20  4:42 PM   Specimen: Nasopharyngeal Swab  Result Value Ref Range Status   SARS Coronavirus 2 NEGATIVE NEGATIVE Final    Comment: (NOTE) SARS-CoV-2 target nucleic acids are NOT DETECTED.  The SARS-CoV-2 RNA is generally detectable in upper and lower respiratory specimens during the acute phase of infection. Negative results do  not preclude SARS-CoV-2 infection, do not rule out co-infections with other pathogens, and should not be used as the sole basis for treatment or other patient management decisions. Negative results must be combined with clinical observations, patient history, and epidemiological information. The expected result is Negative.  Fact Sheet for Patients: SugarRoll.be  Fact Sheet for Healthcare Providers: https://www.woods-mathews.com/  This test is not yet approved or cleared by the [place] FDA and  has been authorized for detection and/or diagnosis of SARS-CoV-2 by FDA under an Emergency Use Authorization (EUA). This EUA will remain  in effect (meaning this test can be used) for the duration of the COVID-19 declaration under Se ction 564(b)(1) of the Act, 21 U.S.C. section 360bbb-3(b)(1), unless the authorization is terminated or revoked sooner.  Performed at [facility], [place] [address], [place], [place] [address]      Labs: BNP (last 3 results) Recent Labs    12/23/20 0652  BNP [id number]   Basic Metabolic Panel: Recent Labs  Lab 12/26/20 0628 12/28/20 0232 12/29/20 0502 12/30/20 0216 12/31/20 0241  NA 136 135 137 138 136  K 4.0 4.2 4.1 4.4 4.5  CL 103 102 105 100 104  CO2 '28 28 27 26 27  '$ GLUCOSE 113* 121* 136* 133* 127*  BUN <5* <5* <5* 6 7  CREATININE 1.11 1.10 1.26* 1.16 1.22  CALCIUM 9.0 9.0 9.1 9.4 9.5  MG  --   --  1.7  --   --   PHOS  --   --  3.3  --   --    Liver Function Tests: Recent Labs  Lab 12/26/20 0628 12/28/20 0232 12/29/20 0502 12/30/20 0216 12/31/20 0241  AST '27 28 25 24 23  '$ ALT '19 24 23 24 24  '$ ALKPHOS 46 52 57 50 52  BILITOT 1.2 0.5 0.5 0.5 0.5  PROT 6.7 6.6 5.9* 6.1* 6.3*  ALBUMIN 3.1* 3.1* 2.8* 2.9* 3.1*   Recent Labs  Lab 12/26/20 0628 12/29/20 0502 12/30/20 0216 12/31/20 0241  LIPASE 62* 64* 57* 54*   No results for input(s): AMMONIA in the last 168 hours. CBC: Recent Labs   Lab 12/28/20 0232 12/29/20 0502  WBC 4.8 4.7  NEUTROABS  --  1.8  HGB 12.1* 11.2*  HCT 36.3* 33.4*  MCV 100.8* 99.4  PLT 195 196   Cardiac Enzymes: No results for input(s): CKTOTAL, CKMB, CKMBINDEX, TROPONINI in the last 168 hours. BNP: Invalid input(s): POCBNP CBG: No results for input(s): GLUCAP in the last 168 hours. D-Dimer No results for input(s): DDIMER in the last 72 hours. Hgb A1c No results for input(s): HGBA1C in the last 72 hours. Lipid Profile No results for input(s): CHOL, HDL, LDLCALC, TRIG, CHOLHDL, LDLDIRECT in the last 72 hours. Thyroid function studies No results for input(s): TSH, T4TOTAL, T3FREE, THYROIDAB in the last 72 hours.  Invalid input(s): FREET3 Anemia work up No results for input(s): VITAMINB12, FOLATE, FERRITIN, TIBC, IRON, RETICCTPCT in the last 72 hours. Urinalysis    Component Value Date/Time  COLORURINE YELLOW 10/31/2020 0404   APPEARANCEUR HAZY (A) 10/31/2020 0404   LABSPEC 1.009 10/31/2020 0404   PHURINE 5.0 10/31/2020 0404   GLUCOSEU NEGATIVE 10/31/2020 0404   HGBUR LARGE (A) 10/31/2020 0404   BILIRUBINUR NEGATIVE 10/31/2020 0404   KETONESUR 5 (A) 10/31/2020 0404   PROTEINUR NEGATIVE 10/31/2020 0404   UROBILINOGEN 0.2 01/20/2015 1643   NITRITE NEGATIVE 10/31/2020 0404   LEUKOCYTESUR NEGATIVE 10/31/2020 0404   Sepsis Labs Invalid input(s): PROCALCITONIN,  WBC,  LACTICIDVEN Microbiology Recent Results (from the past 240 hour(s))  SARS CORONAVIRUS 2 (TAT 6-24 HRS) Nasopharyngeal Nasopharyngeal Swab     Status: None   Collection Time: 12/23/20  4:42 PM   Specimen: Nasopharyngeal Swab  Result Value Ref Range Status   SARS Coronavirus 2 NEGATIVE NEGATIVE Final    Comment: (NOTE) SARS-CoV-2 target nucleic acids are NOT DETECTED.  The SARS-CoV-2 RNA is generally detectable in upper and lower respiratory specimens during the acute phase of infection. Negative results do not preclude SARS-CoV-2 infection, do not rule  out co-infections with other pathogens, and should not be used as the sole basis for treatment or other patient management decisions. Negative results must be combined with clinical observations, patient history, and epidemiological information. The expected result is Negative.  Fact Sheet for Patients: SugarRoll.be  Fact Sheet for Healthcare Providers: https://www.woods-mathews.com/  This test is not yet approved or cleared by the [place] FDA and  has been authorized for detection and/or diagnosis of SARS-CoV-2 by FDA under an Emergency Use Authorization (EUA). This EUA will remain  in effect (meaning this test can be used) for the duration of the COVID-19 declaration under Se ction 564(b)(1) of the Act, 21 U.S.C. section 360bbb-3(b)(1), unless the authorization is terminated or revoked sooner.  Performed at [facility], [place] [address] [place], [place] [address]      Time coordinating discharge:  35 minutes  SIGNED:   Barb Merino, MD  [facility] 12/31/2020, 10:34 AM

## 2020-12-31 NOTE — Progress Notes (Signed)
Discharge instructions reviewed and given to pt, pt acknowledge understanding. Pt awaiting transportation home

## 2021-01-18 ENCOUNTER — Telehealth: Payer: Self-pay | Admitting: Gastroenterology

## 2021-01-18 ENCOUNTER — Encounter (HOSPITAL_COMMUNITY): Payer: Self-pay | Admitting: Physician Assistant

## 2021-01-18 NOTE — Telephone Encounter (Signed)
Patient calling for samples of Creon, Insurance will no longer cover his Creon.  Daughter will pick them up tomorrow

## 2021-01-18 NOTE — Telephone Encounter (Signed)
Inbound call from patient requesting a call please in regards to his Creon medication.

## 2021-01-30 ENCOUNTER — Telehealth (HOSPITAL_COMMUNITY): Payer: Self-pay

## 2021-01-30 NOTE — Telephone Encounter (Signed)
Detailed instructions given, asked to call back with any questions. S.Gilda Abboud EMTP

## 2021-01-31 ENCOUNTER — Inpatient Hospital Stay (HOSPITAL_COMMUNITY)
Admission: EM | Admit: 2021-01-31 | Discharge: 2021-02-04 | DRG: 439 | Disposition: A | Discharge status: Home / Self Care | Payer: Managed Care, Other (non HMO) | Attending: Internal Medicine | Admitting: Internal Medicine

## 2021-01-31 ENCOUNTER — Encounter (HOSPITAL_COMMUNITY): Payer: Self-pay

## 2021-01-31 ENCOUNTER — Other Ambulatory Visit: Payer: Self-pay

## 2021-01-31 ENCOUNTER — Ambulatory Visit (HOSPITAL_COMMUNITY): Payer: Managed Care, Other (non HMO) | Attending: Internal Medicine

## 2021-01-31 DIAGNOSIS — M545 Low back pain, unspecified: Secondary | ICD-10-CM | POA: Diagnosis present

## 2021-01-31 DIAGNOSIS — Z833 Family history of diabetes mellitus: Secondary | ICD-10-CM

## 2021-01-31 DIAGNOSIS — Q600 Renal agenesis, unilateral: Secondary | ICD-10-CM

## 2021-01-31 DIAGNOSIS — Z20822 Contact with and (suspected) exposure to covid-19: Secondary | ICD-10-CM | POA: Diagnosis present

## 2021-01-31 DIAGNOSIS — K219 Gastro-esophageal reflux disease without esophagitis: Secondary | ICD-10-CM | POA: Diagnosis present

## 2021-01-31 DIAGNOSIS — K3184 Gastroparesis: Secondary | ICD-10-CM | POA: Diagnosis present

## 2021-01-31 DIAGNOSIS — R072 Precordial pain: Secondary | ICD-10-CM

## 2021-01-31 DIAGNOSIS — G8929 Other chronic pain: Secondary | ICD-10-CM | POA: Diagnosis present

## 2021-01-31 DIAGNOSIS — Z8249 Family history of ischemic heart disease and other diseases of the circulatory system: Secondary | ICD-10-CM

## 2021-01-31 DIAGNOSIS — E7800 Pure hypercholesterolemia, unspecified: Secondary | ICD-10-CM | POA: Diagnosis present

## 2021-01-31 DIAGNOSIS — Q453 Other congenital malformations of pancreas and pancreatic duct: Secondary | ICD-10-CM

## 2021-01-31 DIAGNOSIS — R1310 Dysphagia, unspecified: Secondary | ICD-10-CM | POA: Diagnosis present

## 2021-01-31 DIAGNOSIS — K859 Acute pancreatitis without necrosis or infection, unspecified: Secondary | ICD-10-CM | POA: Diagnosis not present

## 2021-01-31 DIAGNOSIS — I10 Essential (primary) hypertension: Secondary | ICD-10-CM | POA: Diagnosis present

## 2021-01-31 DIAGNOSIS — Z79899 Other long term (current) drug therapy: Secondary | ICD-10-CM

## 2021-01-31 DIAGNOSIS — Z888 Allergy status to other drugs, medicaments and biological substances status: Secondary | ICD-10-CM

## 2021-01-31 DIAGNOSIS — Z9049 Acquired absence of other specified parts of digestive tract: Secondary | ICD-10-CM

## 2021-01-31 DIAGNOSIS — K861 Other chronic pancreatitis: Secondary | ICD-10-CM | POA: Diagnosis present

## 2021-01-31 DIAGNOSIS — K8689 Other specified diseases of pancreas: Secondary | ICD-10-CM | POA: Diagnosis present

## 2021-01-31 HISTORY — DX: Presence of other specified functional implants: Z96.89

## 2021-01-31 LAB — MYOCARDIAL PERFUSION IMAGING: Rest Nuclear Isotope Dose: 9.3 mCi

## 2021-01-31 MED ORDER — TECHNETIUM TC 99M TETROFOSMIN IV KIT
9.3000 | PACK | Freq: Once | INTRAVENOUS | Status: AC | PRN
  Administered 2021-01-31: 9.3 via INTRAVENOUS
  Filled 2021-01-31: qty 10

## 2021-01-31 MED ORDER — LORAZEPAM 2 MG/ML IJ SOLN: 1.0000 mg | Freq: Once | INTRAMUSCULAR | Status: DC

## 2021-01-31 MED ORDER — GLUCAGON HCL RDNA (DIAGNOSTIC) 1 MG IJ SOLR: 1.0000 mg | Freq: Once | INTRAMUSCULAR | Status: DC

## 2021-01-31 NOTE — ED Provider Notes (Signed)
Emergency Medicine Provider Triage Evaluation Note  Kyle Berry , a 50 y.o. male  was evaluated in triage.  Pt complains of inability to swallow.  States this started 2 hours ago.  Also reports abdominal pain.  Review of Systems  Positive: Dysphagia, abdominal pain Negative: fever  Physical Exam  BP (!) 141/108 (BP Location: Right Arm)   Pulse 100   Temp 98 F (36.7 C) (Oral)   Resp 14   SpO2 99%  Gen:   Awake, no distress   Resp:  Normal effort  MSK:   Moves extremities without difficulty  Other:  No visible throat swelling  Medical Decision Making  Medically screening exam initiated at 11:37 PM.  Appropriate orders placed.  [name] was informed that the remainder of the evaluation will be completed by another provider, this initial triage assessment does not replace that evaluation, and the importance of remaining in the ED until their evaluation is complete.  Dysphagia.  Seen by and discussed with Dr. Wyvonnia Dusky, will move to a room.  Ct soft tissue neck ordered, may need additional advanced imaging.     [name], PA-C 01/31/21 2339    [name], MD 02/01/21 [phone]

## 2021-01-31 NOTE — ED Triage Notes (Signed)
Pt here from home. Pt went and got a nuclear scan done today at [facility], was given meds for exam. Pt noted after getting home from exam, that he's been unable to swallow food or water. Pt states it feel like the muscles in his throat are tight. Pt endorsing SOB, tightness in chest and pain in his shoulders.

## 2021-02-01 ENCOUNTER — Other Ambulatory Visit: Payer: Self-pay

## 2021-02-01 ENCOUNTER — Encounter (HOSPITAL_COMMUNITY): Payer: Self-pay | Admitting: Internal Medicine

## 2021-02-01 ENCOUNTER — Emergency Department (HOSPITAL_COMMUNITY): Payer: Managed Care, Other (non HMO)

## 2021-02-01 DIAGNOSIS — Q453 Other congenital malformations of pancreas and pancreatic duct: Secondary | ICD-10-CM | POA: Diagnosis not present

## 2021-02-01 DIAGNOSIS — R1310 Dysphagia, unspecified: Secondary | ICD-10-CM | POA: Diagnosis present

## 2021-02-01 DIAGNOSIS — G8929 Other chronic pain: Secondary | ICD-10-CM | POA: Diagnosis present

## 2021-02-01 DIAGNOSIS — Z79899 Other long term (current) drug therapy: Secondary | ICD-10-CM | POA: Diagnosis not present

## 2021-02-01 DIAGNOSIS — Z888 Allergy status to other drugs, medicaments and biological substances status: Secondary | ICD-10-CM | POA: Diagnosis not present

## 2021-02-01 DIAGNOSIS — K3184 Gastroparesis: Secondary | ICD-10-CM | POA: Diagnosis present

## 2021-02-01 DIAGNOSIS — I10 Essential (primary) hypertension: Secondary | ICD-10-CM | POA: Diagnosis present

## 2021-02-01 DIAGNOSIS — E7800 Pure hypercholesterolemia, unspecified: Secondary | ICD-10-CM | POA: Diagnosis present

## 2021-02-01 DIAGNOSIS — Q600 Renal agenesis, unilateral: Secondary | ICD-10-CM

## 2021-02-01 DIAGNOSIS — K8689 Other specified diseases of pancreas: Secondary | ICD-10-CM | POA: Diagnosis present

## 2021-02-01 DIAGNOSIS — K861 Other chronic pancreatitis: Secondary | ICD-10-CM

## 2021-02-01 DIAGNOSIS — K859 Acute pancreatitis without necrosis or infection, unspecified: Secondary | ICD-10-CM | POA: Diagnosis present

## 2021-02-01 DIAGNOSIS — M545 Low back pain, unspecified: Secondary | ICD-10-CM | POA: Diagnosis present

## 2021-02-01 DIAGNOSIS — K219 Gastro-esophageal reflux disease without esophagitis: Secondary | ICD-10-CM

## 2021-02-01 DIAGNOSIS — Z833 Family history of diabetes mellitus: Secondary | ICD-10-CM | POA: Diagnosis not present

## 2021-02-01 DIAGNOSIS — Z9049 Acquired absence of other specified parts of digestive tract: Secondary | ICD-10-CM | POA: Diagnosis not present

## 2021-02-01 DIAGNOSIS — Z20822 Contact with and (suspected) exposure to covid-19: Secondary | ICD-10-CM | POA: Diagnosis present

## 2021-02-01 DIAGNOSIS — Z8249 Family history of ischemic heart disease and other diseases of the circulatory system: Secondary | ICD-10-CM | POA: Diagnosis not present

## 2021-02-01 LAB — CBC WITH DIFFERENTIAL/PLATELET
Abs Immature Granulocytes: 0.04 10*3/uL (ref 0.00–0.07)
Basophils Absolute: 0 10*3/uL (ref 0.0–0.1)
Basophils Relative: 0 %
Eosinophils Absolute: 0.1 10*3/uL (ref 0.0–0.5)
Eosinophils Relative: 1 %
HCT: 40.1 % (ref 39.0–52.0)
Hemoglobin: 13.4 g/dL (ref 13.0–17.0)
Immature Granulocytes: 0 %
Lymphocytes Relative: 20 %
Lymphs Abs: 2.2 10*3/uL (ref 0.7–4.0)
MCH: 32.6 pg (ref 26.0–34.0)
MCHC: 33.4 g/dL (ref 30.0–36.0)
MCV: 97.6 fL (ref 80.0–100.0)
Monocytes Absolute: 0.6 10*3/uL (ref 0.1–1.0)
Monocytes Relative: 5 %
Neutro Abs: 8.1 10*3/uL — ABNORMAL HIGH (ref 1.7–7.7)
Neutrophils Relative %: 74 %
Platelets: 235 10*3/uL (ref 150–400)
RBC: 4.11 MIL/uL — ABNORMAL LOW (ref 4.22–5.81)
RDW: 15.5 % (ref 11.5–15.5)
WBC: 11.1 10*3/uL — ABNORMAL HIGH (ref 4.0–10.5)
nRBC: 0 % (ref 0.0–0.2)

## 2021-02-01 LAB — COMPREHENSIVE METABOLIC PANEL
ALT: 18 U/L (ref 0–44)
AST: 27 U/L (ref 15–41)
Albumin: 3.8 g/dL (ref 3.5–5.0)
Alkaline Phosphatase: 62 U/L (ref 38–126)
Anion gap: 10 (ref 5–15)
BUN: 5 mg/dL — ABNORMAL LOW (ref 6–20)
CO2: 21 mmol/L — ABNORMAL LOW (ref 22–32)
Calcium: 9 mg/dL (ref 8.9–10.3)
Chloride: 102 mmol/L (ref 98–111)
Creatinine, Ser: 0.97 mg/dL (ref 0.61–1.24)
GFR, Estimated: >60 mL/min (ref >60)
Glucose, Bld: 121 mg/dL — ABNORMAL HIGH (ref 70–99)
Potassium: 3.1 mmol/L — ABNORMAL LOW (ref 3.5–5.1)
Sodium: 133 mmol/L — ABNORMAL LOW (ref 135–145)
Total Bilirubin: 1 mg/dL (ref 0.3–1.2)
Total Protein: 7.4 g/dL (ref 6.5–8.1)

## 2021-02-01 LAB — I-STAT CHEM 8, ED
BUN: <3 mg/dL — ABNORMAL LOW (ref 6–20)
Calcium, Ion: 1.17 mmol/L (ref 1.15–1.40)
Chloride: 103 mmol/L (ref 98–111)
Creatinine, Ser: 0.9 mg/dL (ref 0.61–1.24)
Glucose, Bld: 119 mg/dL — ABNORMAL HIGH (ref 70–99)
HCT: 42 % (ref 39.0–52.0)
Hemoglobin: 14.3 g/dL (ref 13.0–17.0)
Potassium: 3.1 mmol/L — ABNORMAL LOW (ref 3.5–5.1)
Sodium: 138 mmol/L (ref 135–145)
TCO2: 23 mmol/L (ref 22–32)

## 2021-02-01 LAB — RESP PANEL BY RT-PCR (FLU A&B, COVID) ARPGX2
Influenza A by PCR: NEGATIVE
Influenza B by PCR: NEGATIVE
SARS Coronavirus 2 by RT PCR: NEGATIVE

## 2021-02-01 LAB — TROPONIN I (HIGH SENSITIVITY)
Troponin I (High Sensitivity): 4 ng/L (ref <18)
Troponin I (High Sensitivity): 2 ng/L (ref <18)

## 2021-02-01 LAB — LIPASE, BLOOD: Lipase: 61 U/L — ABNORMAL HIGH (ref 11–51)

## 2021-02-01 LAB — GLUCOSE, CAPILLARY: Glucose-Capillary: 105 mg/dL — ABNORMAL HIGH (ref 70–99)

## 2021-02-01 MED ORDER — IOHEXOL 350 MG/ML SOLN
75.0000 mL | Freq: Once | INTRAVENOUS | Status: AC | PRN
  Administered 2021-02-01: 75 mL via INTRAVENOUS

## 2021-02-01 MED ORDER — HYDROMORPHONE HCL 1 MG/ML IJ SOLN
1.0000 mg | INTRAMUSCULAR | Status: DC | PRN
  Administered 2021-02-01 – 2021-02-04 (×16): 1 mg via INTRAVENOUS
  Filled 2021-02-01 (×16): qty 1

## 2021-02-01 MED ORDER — ONDANSETRON HCL 4 MG PO TABS: 4.0000 mg | ORAL_TABLET | Freq: Four times a day (QID) | ORAL | Status: DC | PRN

## 2021-02-01 MED ORDER — HYDROMORPHONE HCL 1 MG/ML IJ SOLN
1.0000 mg | Freq: Once | INTRAMUSCULAR | Status: AC
Start: 2021-02-01 — End: 2021-02-01
  Administered 2021-02-01: 1 mg via INTRAVENOUS
  Filled 2021-02-01: qty 1

## 2021-02-01 MED ORDER — ENOXAPARIN SODIUM 40 MG/0.4ML IJ SOSY
40.0000 mg | PREFILLED_SYRINGE | INTRAMUSCULAR | Status: DC
  Administered 2021-02-01 – 2021-02-03 (×3): 40 mg via SUBCUTANEOUS
  Filled 2021-02-01 (×3): qty 0.4

## 2021-02-01 MED ORDER — SODIUM CHLORIDE 0.9 % IV BOLUS
1000.0000 mL | Freq: Once | INTRAVENOUS | Status: AC
  Administered 2021-02-01: 1000 mL via INTRAVENOUS

## 2021-02-01 MED ORDER — IRBESARTAN 150 MG PO TABS
150.0000 mg | ORAL_TABLET | Freq: Every day | ORAL | Status: DC
  Administered 2021-02-01 – 2021-02-04 (×4): 150 mg via ORAL
  Filled 2021-02-01 (×5): qty 1

## 2021-02-01 MED ORDER — FAMOTIDINE 20 MG PO TABS
20.0000 mg | ORAL_TABLET | Freq: Two times a day (BID) | ORAL | Status: DC
  Administered 2021-02-01 – 2021-02-04 (×7): 20 mg via ORAL
  Filled 2021-02-01 (×7): qty 1

## 2021-02-01 MED ORDER — MELATONIN 5 MG PO TABS: 5.0000 mg | ORAL_TABLET | Freq: Every evening | ORAL | Status: DC | PRN

## 2021-02-01 MED ORDER — ACETAMINOPHEN 650 MG RE SUPP: 650.0000 mg | Freq: Four times a day (QID) | RECTAL | Status: DC | PRN

## 2021-02-01 MED ORDER — HYDROMORPHONE HCL 1 MG/ML IJ SOLN
1.0000 mg | Freq: Once | INTRAMUSCULAR | Status: AC
  Administered 2021-02-01: 1 mg via INTRAVENOUS
  Filled 2021-02-01: qty 1

## 2021-02-01 MED ORDER — ONDANSETRON HCL 4 MG/2ML IJ SOLN: 4.0000 mg | Freq: Four times a day (QID) | INTRAMUSCULAR | Status: DC | PRN

## 2021-02-01 MED ORDER — LACTATED RINGERS IV SOLN: INTRAVENOUS | Status: AC

## 2021-02-01 MED ORDER — ACETAMINOPHEN 325 MG PO TABS: 650.0000 mg | ORAL_TABLET | Freq: Four times a day (QID) | ORAL | Status: DC | PRN

## 2021-02-01 NOTE — ED Notes (Signed)
Patient transported to CT 

## 2021-02-01 NOTE — Assessment & Plan Note (Signed)
Stable

## 2021-02-01 NOTE — Assessment & Plan Note (Signed)
Continue pepcid

## 2021-02-01 NOTE — Progress Notes (Signed)
Pt arrived to [facility] room 7 alert and oriented. Bed in lowest position call light in reach.

## 2021-02-01 NOTE — Assessment & Plan Note (Addendum)
-   Lipase 61 on admission with noted CT findings - Continue IV fluids - Continue pain control - Continue n.p.o. and will slowly advance

## 2021-02-01 NOTE — ED Notes (Signed)
Pt sitting up in stretcher, c/o pain. Primary nurse in with emergent pt so this RN hung fluids and administered pain meds per orders. Pt resting comfortably, VSS, no distress noted. Call light in reach.

## 2021-02-01 NOTE — H&P (Signed)
History and Physical    Kyle Berry TGY:[id number] DOB: 1970/12/05 DOA: 01/31/2021  PCP: [name] (Inactive)   Patient coming from: Home  I have personally briefly reviewed patient's old medical records in [place]  CC: abd pain HPI: 50 yo AAM with hx of recurrent pancreatitis, hx of pancreatic divisum, HTN, GERD, presents to the ER today with a 1 day history of worsening abdominal pain.  Patient states that he had a nuclear stress test yesterday.  He states that when he received the radionuclide for a stress test, he felt a burning in his stomach.  He states that he felt his stomach started getting distended.  He states he was not able to complete the test.  He states he went home started having worsening abdominal pain.  He presented the ER today with worsening abdominal pain.  Lipase was mildly elevated.  CT scan demonstrates acute on chronic pancreatitis.  Patient has a history of recurrent proctitis and usually has to come to the hospital and be admitted for about a week on IV narcotics.  Patient states that he does not drink any alcohol.  Does not smoke.  Patient working on his third dose of IV Dilaudid due to pain.  [facility] hospitalist contacted for admission.   ED Course: mildly elevated lipse. CT abd showed acute on chronic pancreatitis.  Review of Systems:  Review of Systems  Constitutional: Negative.   HENT: Negative.    Eyes: Negative.   Respiratory: Negative.    Cardiovascular: Negative.   Gastrointestinal:  Positive for abdominal pain and nausea. Negative for blood in stool, constipation and diarrhea.       Also complained of dysphagia  Genitourinary: Negative.   Musculoskeletal: Negative.   Skin: Negative.   Neurological: Negative.   Endo/Heme/Allergies: Negative.   Psychiatric/Behavioral: Negative.    All other systems reviewed and are negative.  Past Medical History:  Diagnosis Date   Chronic bronchitis (HCC)    Chronic lower back  pain    Chronic pancreatitis ([place])    Congenital single kidney    Gastroparesis 06/2020   confirmed by emptying study at [facility]   GERD (gastroesophageal reflux disease)    Headache    "once/month" (05/13/2017)   High cholesterol    Hypertension    Migraine    "a couple/year" (05/13/2017)   Nephrolithiasis 05/13/2017   Pancreatitis    Pneumonia ~ 09/2015   Presence of pancreatic duct stent    Recurrent acute pancreatitis     Past Surgical History:  Procedure Laterality Date   BIOPSY  11/02/2020   Procedure: BIOPSY;  Surgeon: Irving Copas., MD;  Location: [place];  Service: Gastroenterology;;   CHOLECYSTECTOMY N/A 10/23/2017   Procedure: LAPAROSCOPIC CHOLECYSTECTOMY WITH INTRAOPERATIVE CHOLANGIOGRAM;  Surgeon: Johnathan Hausen, MD;  Location: [facility];  Service: General;  Laterality: N/A;   COLONOSCOPY WITH PROPOFOL N/A 11/02/2019   Procedure: COLONOSCOPY WITH PROPOFOL;  Surgeon: Irene Shipper, MD;  Location: [facility] ENDOSCOPY;  Service: Endoscopy;  Laterality: N/A;   ERCP N/A 11/02/2020   Procedure: ENDOSCOPIC RETROGRADE CHOLANGIOPANCREATOGRAPHY (ERCP);  Surgeon: Irving Copas., MD;  Location: [place];  Service: Gastroenterology;  Laterality: N/A;   ESOPHAGOGASTRODUODENOSCOPY N/A 11/02/2020   Procedure: ESOPHAGOGASTRODUODENOSCOPY (EGD);  Surgeon: Irving Copas., MD;  Location: [place];  Service: Gastroenterology;  Laterality: N/A;   ESOPHAGOGASTRODUODENOSCOPY (EGD) WITH PROPOFOL N/A 11/06/2019   Procedure: ESOPHAGOGASTRODUODENOSCOPY (EGD) WITH PROPOFOL;  Surgeon: Irene Shipper, MD;  Location[facility] ENDOSCOPY;  Service: Endoscopy;  Laterality:  N/A;   HEMOSTASIS CLIP PLACEMENT  11/02/2019   Procedure: HEMOSTASIS CLIP PLACEMENT;  Surgeon: Irene Shipper, MD;  Location: [facility] ENDOSCOPY;  Service: Endoscopy;;   NO PAST SURGERIES     REMOVAL OF STONES  11/02/2020   Procedure: REMOVAL OF STONES;  Surgeon: Irving Copas., MD;  Location: [place];  Service:  Gastroenterology;;   Lavell Islam REMOVAL  11/02/2020   Procedure: STENT REMOVAL;  Surgeon: Irving Copas., MD;  Location: [place];  Service: Gastroenterology;;     reports that he has never smoked. He has never used smokeless tobacco. He reports that he does not currently use alcohol. He reports that he does not use drugs.  Allergies  Allergen Reactions   Diclofenac Hives   Norvasc [Amlodipine Besylate] Other (See Comments)    Fluid buildup in chest   Omeprazole Other (See Comments)    MD stopped due to pancreatitis   Prednisone Other (See Comments)    Mood swings    Family History  Problem Relation Age of Onset   Hypertension Other    Hypertension Mother    Hypertension Father    Kidney disease Father    Hypertension Sister    Diabetes Sister    Hypertension Brother    Pancreatic cancer Paternal Grandmother    Colon cancer Cousin    Stomach cancer Neg Hx    Esophageal cancer Neg Hx     Prior to Admission medications   Medication Sig Start Date End Date Taking? Authorizing Provider  famotidine (PEPCID) 20 MG tablet Take 1 tablet (20 mg total) by mouth 2 (two) times daily. Instructions: Take Pepcid 20 mg twice daily x30 days, then take Pepcid 20 mg daily thereafter. Patient taking differently: Take 20 mg by mouth daily. Instructions: Take Pepcid 20 mg twice daily x30 days, then take Pepcid 20 mg daily thereafter. 11/03/20 05/02/21 Yes Kayleen Memos, DO  lipase/protease/amylase (CREON) 36000 UNITS CPEP capsule Take 2 capsules (72,000 Units total) by mouth 3 (three) times daily with meals. 12/31/20  Yes Barb Merino, MD  Multiple Vitamin (MULTIVITAMIN WITH MINERALS) TABS tablet Take 1 tablet by mouth daily. Patient taking differently: Take 1 tablet by mouth every other day. 11/04/20 02/02/21 Yes Hall, Carole N, DO  Naphazoline-Pheniramine (VISINE-A OP) Place 1 drop into both eyes daily as needed (red eyes).   Yes [provider]  promethazine (PHENERGAN) 25 MG  tablet Take 25 mg by mouth every 6 (six) hours as needed for nausea or vomiting.   Yes [provider]  telmisartan (MICARDIS) 40 MG tablet Take 40 mg by mouth daily. 09/26/20  Yes [provider]    Physical Exam: Vitals:   02/01/21 0145 02/01/21 0200 02/01/21 0230 02/01/21 0415  BP: (!) 159/93 (!) 152/113 (!) 171/94 (!) 144/102  Pulse: 78 68 71 74  Resp: 16 15 17 16   Temp:      TempSrc:      SpO2: 97% 99% 97% 99%  Weight:      Height:        Physical Exam   Labs on Admission: I have personally reviewed following labs and imaging studies  CBC: Recent Labs  Lab 01/31/21 2347 02/01/21 0004  WBC 11.1*  --   NEUTROABS 8.1*  --   HGB 13.4 14.3  HCT 40.1 42.0  MCV 97.6  --   PLT 235  --    Basic Metabolic Panel: Recent Labs  Lab 01/31/21 2347 02/01/21 0004  NA 133* 138  K 3.1*  3.1*  CL 102 103  CO2 21*  --   GLUCOSE 121* 119*  BUN 5* <3*  CREATININE 0.97 0.90  CALCIUM 9.0  --    GFR: Estimated Creatinine Clearance: 102.7 mL/min (by C-G formula based on SCr of 0.9 mg/dL). Liver Function Tests: Recent Labs  Lab 01/31/21 2347  AST 27  ALT 18  ALKPHOS 62  BILITOT 1.0  PROT 7.4  ALBUMIN 3.8   Recent Labs  Lab 01/31/21 2347  LIPASE 61*   No results for input(s): AMMONIA in the last 168 hours. Coagulation Profile: No results for input(s): INR, PROTIME in the last 168 hours. Cardiac Enzymes: No results for input(s): CKTOTAL, CKMB, CKMBINDEX, TROPONINI in the last 168 hours. BNP (last 3 results) No results for input(s): PROBNP in the last 8760 hours. HbA1C: No results for input(s): HGBA1C in the last 72 hours. CBG: No results for input(s): GLUCAP in the last 168 hours. Lipid Profile: No results for input(s): CHOL, HDL, LDLCALC, TRIG, CHOLHDL, LDLDIRECT in the last 72 hours. Thyroid Function Tests: No results for input(s): TSH, T4TOTAL, FREET4, T3FREE, THYROIDAB in the last 72 hours. Anemia Panel: No results for input(s): VITAMINB12,  FOLATE, FERRITIN, TIBC, IRON, RETICCTPCT in the last 72 hours. Urine analysis:    Component Value Date/Time   COLORURINE YELLOW 10/31/2020 0404   APPEARANCEUR HAZY (A) 10/31/2020 0404   LABSPEC 1.009 10/31/2020 0404   PHURINE 5.0 10/31/2020 0404   GLUCOSEU NEGATIVE 10/31/2020 0404   HGBUR LARGE (A) 10/31/2020 0404   BILIRUBINUR NEGATIVE 10/31/2020 0404   KETONESUR 5 (A) 10/31/2020 0404   PROTEINUR NEGATIVE 10/31/2020 0404   UROBILINOGEN 0.2 01/20/2015 1643   NITRITE NEGATIVE 10/31/2020 0404   LEUKOCYTESUR NEGATIVE 10/31/2020 0404    Radiological Exams on Admission: I have personally reviewed images CT Soft Tissue Neck W Contrast  Result Date: 02/01/2021 CLINICAL DATA:  CN 9 cranial neuropathy, difficulty swallowing EXAM: CT NECK WITH CONTRAST TECHNIQUE: Multidetector CT imaging of the neck was performed using the standard protocol following the bolus administration of intravenous contrast. CONTRAST:  45mL OMNIPAQUE IOHEXOL 350 MG/ML SOLN COMPARISON:  None. FINDINGS: Pharynx and larynx: Normal. No mass or swelling. Salivary glands: No inflammation, mass, or stone. Thyroid: Normal. Lymph nodes: None enlarged or abnormal density. Vascular: Negative. Limited intracranial: Negative. Visualized orbits: Negative. Mastoids and visualized paranasal sinuses: Clear. Skeleton: Mild reversal of the normal cervical lordosis. No acute osseous abnormality. Upper chest: For findings in the thorax, please see same day CT chest. Other: None. IMPRESSION: No acute process in the neck. Electronically Signed   By: Merilyn Baba M.D.   On: 02/01/2021 01:42   CT CHEST W CONTRAST  Result Date: 02/01/2021 CLINICAL DATA:  Recent cardiac nuclear scan with subsequent difficulty swallowing, initial encounter EXAM: CT CHEST WITH CONTRAST TECHNIQUE: Multidetector CT imaging of the chest was performed during intravenous contrast administration. CONTRAST:  69mL OMNIPAQUE IOHEXOL 350 MG/ML SOLN COMPARISON:  12/23/2020  FINDINGS: Cardiovascular: Thoracic aorta is within normal limits without aneurysmal dilatation or dissection. No cardiac enlargement is seen. No coronary calcifications are noted. Pulmonary artery as visualized is within normal limits although not timed for embolus evaluation. Mediastinum/Nodes: Thoracic inlet is within normal limits. The esophagus as visualized is within normal limits. No sizable hilar or mediastinal adenopathy is seen. Lungs/Pleura: Lungs are well aerated bilaterally. Minimal left basilar atelectasis is seen. No focal infiltrate or effusion is noted. Musculoskeletal: No acute bony abnormality is noted. IMPRESSION: Minimal left basilar atelectasis. No other focal abnormality is seen. Electronically  Signed   By: Inez Catalina M.D.   On: 02/01/2021 01:33   CT ABDOMEN PELVIS W CONTRAST  Result Date: 02/01/2021 CLINICAL DATA:  Epigastric pain and difficulty swallowing EXAM: CT ABDOMEN AND PELVIS WITH CONTRAST TECHNIQUE: Multidetector CT imaging of the abdomen and pelvis was performed using the standard protocol following bolus administration of intravenous contrast. CONTRAST:  47mL OMNIPAQUE IOHEXOL 350 MG/ML SOLN COMPARISON:  12/23/2020 FINDINGS: Hepatobiliary: Fatty infiltration of the liver is noted. Gallbladder has been surgically removed. Pancreas: Pancreas is well visualized. Some mild peripancreatic inflammatory changes again noted consistent with pancreatitis. The overall appearance is stable. No pseudocyst is seen. Spleen: Normal in size without focal abnormality. Adrenals/Urinary Tract: Adrenal glands are within normal limits. Right kidney is again atrophic in nature. Left kidney is somewhat hypertrophied. No calculi are seen as contrast material has already been excreted in the collecting system. No obstructive changes are noted. The bladder is partially distended. Stomach/Bowel: No obstructive or inflammatory changes of the colon are noted. The appendix is within normal limits. No small  bowel abnormality is seen. Stomach is partially distended with fluid. Vascular/Lymphatic: No significant vascular findings are present. No enlarged abdominal or pelvic lymph nodes. Reproductive: Prostate is unremarkable. Other: No abdominal wall hernia or abnormality. No abdominopelvic ascites. Musculoskeletal: Degenerative changes of lumbar spine are noted. No acute bony abnormality is seen. IMPRESSION: Mild peripancreatic inflammatory changes similar to that seen on the prior exam consistent with mild pancreatitis. No pseudocyst is seen. Chronic right renal atrophy. Previously seen left renal calculus is not appreciated on this exam given the contrast administration. Fatty liver. Electronically Signed   By: Inez Catalina M.D.   On: 02/01/2021 01:31    EKG: I have personally reviewed EKG: NSR   Assessment/Plan Principal Problem:   Acute on chronic pancreatitis [facility]) Active Problems:   Essential hypertension   Esophageal reflux   Congenital single kidney   Pancreatic insufficiency    Acute on chronic pancreatitis ([place]) Admit to med/surg bed. IVF. NPO. Continue with prn IV dilaudid. Consider changing to po dilaudid in next 24 hours.  Essential hypertension Continue home HTN meds. Will be on IVF.  Congenital single kidney Stable.  Pancreatic insufficiency Needs pancreatic enzymes when diet is restarted  Esophageal reflux Continue pepcid  DVT prophylaxis: Lovenox Code Status: Full Code Family Communication: no family at bedside  Disposition Plan: return home  Consults called: none  Admission status: Inpatient, Med-Surg   Kristopher Oppenheim, DO [facility] 02/01/2021, 5:15 AM

## 2021-02-01 NOTE — Progress Notes (Signed)
Progress Note    Kyle Berry   FYB:[id number]  DOB: [birth year]  DOA: 01/31/2021     0 Date of Service: 02/01/2021   Clinical Course 50 yo AAM with hx of recurrent pancreatitis, hx of pancreatic divisum, HTN, GERD, presented to the ER with a 1 day history of worsening abdominal pain.   Patient had a nuclear stress test the day before admission and in the past has tolerated contrast but felt a burning in his stomach after this test.  Due to this, he presented for further evaluation.  Lipase was found to be mildly elevated, 61 and CT abdomen/pelvis showed mild acute on chronic pancreatitis. He was admitted for pain control, fluids, and further monitoring.  Assessment and Plan * Acute on chronic pancreatitis (HCC) - Lipase 61 on admission with noted CT findings - Continue IV fluids - Continue pain control - Continue n.p.o. and will slowly advance  Pancreatic insufficiency Needs pancreatic enzymes when diet is restarted  Congenital single kidney Stable.  Esophageal reflux Continue pepcid  Essential hypertension Continue home HTN meds. Will be on IVF.     Subjective:  Seen in the ER this morning.  Pain better controlled after treatment on admission.  Still no appetite for advancing diet.  Objective Vitals:   02/01/21 0930 02/01/21 0950 02/01/21 1000 02/01/21 1025  BP: (!) 147/92 138/90 (!) 139/91 (!) 155/89  Pulse: (!) 59 79 60 62  Resp: 10 (!) 22 10   Temp:   98.1 F (36.7 C) 97.9 F (36.6 C)  TempSrc:   Oral Oral  SpO2: 97% 100% 97% 97%  Weight:      Height:       74.8 kg  Vital signs were reviewed and unremarkable.   Exam Physical Exam Constitutional:      General: He is not in acute distress.    Appearance: Normal appearance.  HENT:     Head: Normocephalic and atraumatic.     Mouth/Throat:     Mouth: Mucous membranes are moist.  Eyes:     Extraocular Movements: Extraocular movements intact.  Cardiovascular:     Rate and Rhythm: Normal rate  and regular rhythm.  Pulmonary:     Effort: Pulmonary effort is normal.     Breath sounds: Normal breath sounds. No wheezing.  Abdominal:     General: Bowel sounds are normal. There is no distension.     Palpations: Abdomen is soft.     Comments: TTP in LUQ with radiation to back. No R/G  Musculoskeletal:        General: Normal range of motion.     Cervical back: Normal range of motion and neck supple.  Skin:    General: Skin is warm and dry.  Neurological:     General: No focal deficit present.     Mental Status: He is alert.  Psychiatric:        Mood and Affect: Mood normal.        Behavior: Behavior normal.     Labs / Other Information My review of labs, imaging, notes and other tests shows no new significant findings.    Disposition Plan: Status is: Inpatient  Remains inpatient appropriate because: Unable to maintain adequate nutrition requiring IV fluids, IV pain control, bowel rest    Time spent: Greater than 50% of the 35 minute visit was spent in counseling/coordination of care for the patient as laid out in the A&P.  Dwyane Dee, MD [facility] 02/01/2021, 1:25 PM

## 2021-02-01 NOTE — Subjective & Objective (Signed)
CC: abd pain HPI: 50 yo AAM with hx of recurrent pancreatitis, hx of pancreatic divisum, HTN, GERD, presents to the ER today with a 1 day history of worsening abdominal pain.  Patient states that he had a nuclear stress test yesterday.  He states that when he received the radionuclide for a stress test, he felt a burning in his stomach.  He states that he felt his stomach started getting distended.  He states he was not able to complete the test.  He states he went home started having worsening abdominal pain.  He presented the ER today with worsening abdominal pain.  Lipase was mildly elevated.  CT scan demonstrates acute on chronic pancreatitis.  Patient has a history of recurrent proctitis and usually has to come to the hospital and be admitted for about a week on IV narcotics.  Patient states that he does not drink any alcohol.  Does not smoke.  Patient working on his third dose of IV Dilaudid due to pain.  [facility] hospitalist contacted for admission.

## 2021-02-01 NOTE — ED Provider Notes (Signed)
[facility] Emergency Department Provider Note MRN:  [id number]  Arrival date & time: 02/01/21     Chief Complaint   Dysphagia   History of Present Illness   Kyle Berry is a 50 y.o. year-old male with a history of pancreatitis, gastroparesis presenting to the ED with chief complaint of dysphagia.  About 2 hours ago patient began experiencing trouble swallowing as well as epigastric pain, chest pain.  Has experienced these symptoms before.  Was undergoing nuclear medicine study earlier today and has pain started right before the test.  Feels like the muscles in his throat are not working properly.  Denies any headache or vision change, endorsing mild shortness of breath, no lower abdominal pain, no recent fever.  Swallowing issue seems improved when he leans forward.  Symptoms are moderate to severe, constant.  Review of Systems  A complete 10 system review of systems was obtained and all systems are negative except as noted in the HPI and PMH.   Patient's Health History    Past Medical History:  Diagnosis Date   Chronic bronchitis ([place])    Chronic lower back pain    Chronic pancreatitis ([place])    Congenital single kidney    Gastroparesis 06/2020   confirmed by emptying study at [facility]   GERD (gastroesophageal reflux disease)    Headache    "once/month" (05/13/2017)   High cholesterol    Hypertension    Migraine    "a couple/year" (05/13/2017)   Nephrolithiasis 05/13/2017   Pancreatitis    Pneumonia ~ 09/2015   Recurrent acute pancreatitis     Past Surgical History:  Procedure Laterality Date   BIOPSY  11/02/2020   Procedure: BIOPSY;  Surgeon: Irving Copas., MD;  Location: [place];  Service: Gastroenterology;;   CHOLECYSTECTOMY N/A 10/23/2017   Procedure: LAPAROSCOPIC CHOLECYSTECTOMY WITH INTRAOPERATIVE CHOLANGIOGRAM;  Surgeon: Johnathan Hausen, MD;  Location: [facility];  Service: General;  Laterality: N/A;   COLONOSCOPY WITH PROPOFOL  N/A 11/02/2019   Procedure: COLONOSCOPY WITH PROPOFOL;  Surgeon: Irene Shipper, MD;  Location[facility];  Service: Endoscopy;  Laterality: N/A;   ERCP N/A 11/02/2020   Procedure: ENDOSCOPIC RETROGRADE CHOLANGIOPANCREATOGRAPHY (ERCP);  Surgeon: Irving Copas., MD;  Location: [place];  Service: Gastroenterology;  Laterality: N/A;   ESOPHAGOGASTRODUODENOSCOPY N/A 11/02/2020   Procedure: ESOPHAGOGASTRODUODENOSCOPY (EGD);  Surgeon: Irving Copas., MD;  Location: [place];  Service: Gastroenterology;  Laterality: N/A;   ESOPHAGOGASTRODUODENOSCOPY (EGD) WITH PROPOFOL N/A 11/06/2019   Procedure: ESOPHAGOGASTRODUODENOSCOPY (EGD) WITH PROPOFOL;  Surgeon: Irene Shipper, MD;  Location: [facility] ENDOSCOPY;  Service: Endoscopy;  Laterality: N/A;   HEMOSTASIS CLIP PLACEMENT  11/02/2019   Procedure: HEMOSTASIS CLIP PLACEMENT;  Surgeon: Irene Shipper, MD;  Location: [facility] ENDOSCOPY;  Service: Endoscopy;;   NO PAST SURGERIES     REMOVAL OF STONES  11/02/2020   Procedure: REMOVAL OF STONES;  Surgeon: Irving Copas., MD;  Location: [place];  Service: Gastroenterology;;   Lavell Islam REMOVAL  11/02/2020   Procedure: STENT REMOVAL;  Surgeon: Irving Copas., MD;  Location: [facility] ENDOSCOPY;  Service: Gastroenterology;;    Family History  Problem Relation Age of Onset   Hypertension Other    Hypertension Mother    Hypertension Father    Kidney disease Father    Hypertension Sister    Diabetes Sister    Hypertension Brother    Pancreatic cancer Paternal Grandmother    Colon cancer Cousin    Stomach cancer Neg Hx  Esophageal cancer Neg Hx     Social History   Socioeconomic History   Marital status: Single    Spouse name: Not on file   Number of children: Not on file   Years of education: Not on file   Highest education level: Not on file  Occupational History   Occupation: Engineer, building services  Tobacco Use   Smoking status: Never   Smokeless tobacco: Never  Vaping Use    Vaping Use: Never used  Substance and Sexual Activity   Alcohol use: Not Currently    Comment: remote h/o heavy use   Drug use: No   Sexual activity: Yes    Partners: Female    Birth control/protection: Condom  Other Topics Concern   Not on file  Social History Narrative   Not on file   Social Determinants of Health   Financial Resource Strain: Not on file  Food Insecurity: Not on file  Transportation Needs: Not on file  Physical Activity: Not on file  Stress: Not on file  Social Connections: Not on file  Intimate Partner Violence: Not on file     Physical Exam   Vitals:   02/01/21 0230 02/01/21 0415  BP: (!) 171/94 (!) 144/102  Pulse: 71 74  Resp: 17 16  Temp:    SpO2: 97% 99%    CONSTITUTIONAL: Well-appearing, NAD NEURO:  Alert and oriented x 3, no focal deficits EYES:  eyes equal and reactive ENT/NECK:  no LAD, no JVD CARDIO: Regular rate, well-perfused, normal S1 and S2 PULM:  CTAB no wheezing or rhonchi GI/GU:  normal bowel sounds, non-distended, non-tender MSK/SPINE:  No gross deformities, no edema SKIN:  no rash, atraumatic PSYCH:  Appropriate speech and behavior  *Additional and/or pertinent findings included in MDM below  Diagnostic and Interventional Summary    EKG Interpretation  Date/Time:  Tuesday January 31 2021 23:17:37 EDT Ventricular Rate:  92 PR Interval:  160 QRS Duration: 76 QT Interval:  338 QTC Calculation: 417 R Axis:   53 Text Interpretation: Normal sinus rhythm Minimal voltage criteria for LVH, may be normal variant ( Sokolow-Lyon ) Borderline ECG Confirmed by Gerlene Fee [phone]) on 02/01/2021 4:51:37 AM       Labs Reviewed  CBC WITH DIFFERENTIAL/PLATELET - Abnormal; Notable for the following components:      Result Value   WBC 11.1 (*)    RBC 4.11 (*)    Neutro Abs 8.1 (*)    All other components within normal limits  COMPREHENSIVE METABOLIC PANEL - Abnormal; Notable for the following components:   Sodium 133 (*)     Potassium 3.1 (*)    CO2 21 (*)    Glucose, Bld 121 (*)    BUN 5 (*)    All other components within normal limits  LIPASE, BLOOD - Abnormal; Notable for the following components:   Lipase 61 (*)    All other components within normal limits  I-STAT CHEM 8, ED - Abnormal; Notable for the following components:   Potassium 3.1 (*)    BUN <3 (*)    Glucose, Bld 119 (*)    All other components within normal limits  RESP PANEL BY RT-PCR (FLU A&B, COVID) ARPGX2  TROPONIN I (HIGH SENSITIVITY)  TROPONIN I (HIGH SENSITIVITY)    CT Soft Tissue Neck W Contrast  Final Result    CT CHEST W CONTRAST  Final Result    CT ABDOMEN PELVIS W CONTRAST  Final Result      Medications  HYDROmorphone (DILAUDID) injection 1 mg (has no administration in time range)  HYDROmorphone (DILAUDID) injection 1 mg (1 mg Intravenous Given 02/01/21 0045)  sodium chloride 0.9 % bolus 1,000 mL (0 mLs Intravenous Stopped 02/01/21 0431)  iohexol (OMNIPAQUE) 350 MG/ML injection 75 mL (75 mLs Intravenous Contrast Given 02/01/21 0118)  HYDROmorphone (DILAUDID) injection 1 mg (1 mg Intravenous Given 02/01/21 0419)     Procedures  /  Critical Care Procedures  ED Course and Medical Decision Making  I have reviewed the triage vital signs, the nursing notes, and pertinent available records from the EMR.  Listed above are laboratory and imaging tests that I personally ordered, reviewed, and interpreted and then considered in my medical decision making (see below for details).  Differential diagnosis includes gastroparesis flare, pancreatitis, esophageal mass or food impaction, superior vena cava syndrome.  Chart review reveals that he has had extensive work-up for these type of symptoms.  Patient seems to be in acute distress with quite a bit of discomfort.  We will repeat CT imaging to exclude emergent process.  Currently complaining mostly of epigastric pain, tolerating his secretions, no airway concerns at this time.      Imaging overall reassuring, evidence of mild pancreatitis.  Patient continues to have significant pain requiring multiple rounds of opioids intravenously, will admit to medicine.  Barth Kirks. Sedonia Small, [place] [email or web address]  Final Clinical Impressions(s) / ED Diagnoses     ICD-10-CM   1. Acute pancreatitis, unspecified complication status, unspecified pancreatitis type  K85.90       ED Discharge Orders     None        Discharge Instructions Discussed with and Provided to Patient:   Discharge Instructions   None       Maudie Flakes, MD 02/01/21 [phone]

## 2021-02-01 NOTE — Assessment & Plan Note (Signed)
Needs pancreatic enzymes when diet is restarted

## 2021-02-01 NOTE — Assessment & Plan Note (Signed)
Continue home HTN meds. Will be on IVF.

## 2021-02-02 LAB — COMPREHENSIVE METABOLIC PANEL
ALT: 15 U/L (ref 0–44)
AST: 18 U/L (ref 15–41)
Albumin: 3 g/dL — ABNORMAL LOW (ref 3.5–5.0)
Alkaline Phosphatase: 50 U/L (ref 38–126)
Anion gap: 8 (ref 5–15)
BUN: <5 mg/dL — ABNORMAL LOW (ref 6–20)
CO2: 28 mmol/L (ref 22–32)
Calcium: 8.8 mg/dL — ABNORMAL LOW (ref 8.9–10.3)
Chloride: 100 mmol/L (ref 98–111)
Creatinine, Ser: 0.95 mg/dL (ref 0.61–1.24)
GFR, Estimated: >60 mL/min (ref >60)
Glucose, Bld: 101 mg/dL — ABNORMAL HIGH (ref 70–99)
Potassium: 3.8 mmol/L (ref 3.5–5.1)
Sodium: 136 mmol/L (ref 135–145)
Total Bilirubin: 1.1 mg/dL (ref 0.3–1.2)
Total Protein: 6.1 g/dL — ABNORMAL LOW (ref 6.5–8.1)

## 2021-02-02 LAB — MAGNESIUM: Magnesium: 1.7 mg/dL (ref 1.7–2.4)

## 2021-02-02 LAB — GLUCOSE, CAPILLARY
Glucose-Capillary: 115 mg/dL — ABNORMAL HIGH (ref 70–99)
Glucose-Capillary: 150 mg/dL — ABNORMAL HIGH (ref 70–99)
Glucose-Capillary: 99 mg/dL (ref 70–99)

## 2021-02-02 LAB — CBC WITH DIFFERENTIAL/PLATELET
Abs Immature Granulocytes: 0.02 10*3/uL (ref 0.00–0.07)
Basophils Absolute: 0 10*3/uL (ref 0.0–0.1)
Basophils Relative: 0 %
Eosinophils Absolute: 0.1 10*3/uL (ref 0.0–0.5)
Eosinophils Relative: 1 %
HCT: 35.2 % — ABNORMAL LOW (ref 39.0–52.0)
Hemoglobin: 11.5 g/dL — ABNORMAL LOW (ref 13.0–17.0)
Immature Granulocytes: 0 %
Lymphocytes Relative: 33 %
Lymphs Abs: 1.8 10*3/uL (ref 0.7–4.0)
MCH: 32.3 pg (ref 26.0–34.0)
MCHC: 32.7 g/dL (ref 30.0–36.0)
MCV: 98.9 fL (ref 80.0–100.0)
Monocytes Absolute: 0.3 10*3/uL (ref 0.1–1.0)
Monocytes Relative: 6 %
Neutro Abs: 3.2 10*3/uL (ref 1.7–7.7)
Neutrophils Relative %: 60 %
Platelets: 215 10*3/uL (ref 150–400)
RBC: 3.56 MIL/uL — ABNORMAL LOW (ref 4.22–5.81)
RDW: 15.7 % — ABNORMAL HIGH (ref 11.5–15.5)
WBC: 5.4 10*3/uL (ref 4.0–10.5)
nRBC: 0 % (ref 0.0–0.2)

## 2021-02-02 LAB — HEMOGLOBIN A1C
Hgb A1c MFr Bld: 5.4 % (ref 4.8–5.6)
Mean Plasma Glucose: 108 mg/dL

## 2021-02-02 LAB — LIPASE, BLOOD: Lipase: 43 U/L (ref 11–51)

## 2021-02-02 MED ORDER — INSULIN ASPART 100 UNIT/ML IJ SOLN: 0.0000 [IU] | Freq: Every day | INTRAMUSCULAR | Status: DC

## 2021-02-02 MED ORDER — SODIUM CHLORIDE 0.9 % IV SOLN
12.5000 mg | Freq: Four times a day (QID) | INTRAVENOUS | Status: DC | PRN
  Administered 2021-02-02 – 2021-02-04 (×4): 12.5 mg via INTRAVENOUS
  Filled 2021-02-02: qty 0.5
  Filled 2021-02-02 (×3): qty 12.5

## 2021-02-02 MED ORDER — INSULIN ASPART 100 UNIT/ML IJ SOLN
0.0000 [IU] | Freq: Three times a day (TID) | INTRAMUSCULAR | Status: DC
  Administered 2021-02-02: 1 [IU] via SUBCUTANEOUS

## 2021-02-02 MED ORDER — PANCRELIPASE (LIP-PROT-AMYL) 36000-114000 UNITS PO CPEP
72000.0000 [IU] | ORAL_CAPSULE | Freq: Three times a day (TID) | ORAL | Status: DC
  Administered 2021-02-02 – 2021-02-04 (×7): 72000 [IU] via ORAL
  Filled 2021-02-02 (×7): qty 2

## 2021-02-02 NOTE — Progress Notes (Signed)
PROGRESS NOTE  Kyle Berry  DOB: 1970/12/23  PCP: [name] (Inactive) GNO:[id number]  DOA: 01/31/2021  LOS: 1 day  Hospital Day: 3   Chief Complaint  Patient presents with   Dysphagia   Brief narrative: Kyle Berry is a 50 y.o. male with PMH significant for recurrent pancreatitis d/t pancreatic divisum, HTN, GERD. Patient presented to the ED on 10/18 with complaint of 1 day history of progressively worsening abdominal pain. Patient had a nuclear stress test the day before admission and in the past has tolerated contrast but this time, he felt a burning in his stomach after this test.   In the ED, patient was hemodynamically stable, lipase level was mildly elevated to 61. CT abdomen pelvis showed some mild peripancreatic inflammatory changes consistent with mild pancreatitis. Because of his symptoms, he was admitted for further evaluation management. See below for details  Subjective: Patient was seen and examined this afternoon. Sitting up in chair.  Continues to have abdominal pain.  Continues to have vomiting.  Slow Stryffeler liquid diet today. Chart reviewed.  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  -Presented with abdominal pain after getting contrast dye.  -Stable findings CT scan and lipase level slightly elevated.   -Because of persistent pain and vomiting, patient was admitted for acute pancreatitis.   -Conservative management given with IV fluid, pain control, bowel rest.  Currently on clear liquid diet.   -Patient continues to throw up but wants to try full liquid diet today. -I switched him from Zofran to Phenergan IV.  ?  Blood in the vomitus -Patient reports that he had an episode of vomiting yesterday and he saw blood in there. -Hemoglobin today is low at 11.5 compared to yesterday which is at 14.3.  But I believe this is  partly dilution.  BUN is not elevated. -Patient reports no vomiting after 1 episode yesterday.  Continue to monitor for now. Recent Labs    12/28/20 0232 12/29/20 0502 01/31/21 2347 02/01/21 0004 02/02/21 0156  HGB 12.1* 11.2* 13.4 14.3 11.5*  MCV 100.8* 99.4 97.6  --  98.9   Pancreatic insufficiency -Resume Creon.   Congenital single kidney -Stable renal function. Recent Labs    12/23/20 0440 12/24/20 0600 12/26/20 0628 12/28/20 0232 12/29/20 0502 12/30/20 0216 12/31/20 0241 01/31/21 2347 02/01/21 0004 02/02/21 0156  BUN 11 <5* <5* <5* <5* 6 7 5* <3* <5*  CREATININE 1.06 1.00 1.11 1.10 1.26* 1.16 1.22 0.97 0.90 0.95    Esophageal reflux -Continue pepcid   Essential hypertension -Telmisartan at home.  Currently on irbesartan.  Mobility: Encourage ambulation Code Status:   Code Status: Full Code  Nutritional status: Body mass index is 21.77 kg/m.     Diet:  Diet Order             Diet clear liquid Room service appropriate? Yes; Fluid consistency: Thin  Diet effective now                  DVT prophylaxis:  enoxaparin (LOVENOX) injection 40 mg Start: 02/01/21 1600 SCDs Start: 02/01/21 0536   Antimicrobials: None Fluid: LR@100  mill per hour Consultants: None Family Communication: None at bedside  Status is: Inpatient  Remains inpatient appropriate because: Continues to have symptoms  Dispo: The patient is from: Home              Anticipated d/c is to: Home in 1 to 2  days              Patient currently is not medically stable to d/c.   Difficult to place patient No     Infusions:   lactated ringers 100 mL/hr at 02/02/21 0128    Scheduled Meds:  enoxaparin (LOVENOX) injection  40 mg Subcutaneous Q24H   famotidine  20 mg Oral BID   irbesartan  150 mg Oral Daily    Antimicrobials: Anti-infectives (From admission, onward)    None       PRN meds: acetaminophen **OR** acetaminophen, HYDROmorphone (DILAUDID) injection, melatonin,  ondansetron **OR** ondansetron (ZOFRAN) IV   Objective: Vitals:   02/02/21 0505 02/02/21 0801  BP: (!) 141/98 (!) 146/94  Pulse: (!) 57 60  Resp: 17 18  Temp: 98.2 F (36.8 C) 98 F (36.7 C)  SpO2: 100% 99%    Intake/Output Summary (Last 24 hours) at 02/02/2021 1009 Last data filed at 02/01/2021 1500 Gross per 24 hour  Intake 747.42 ml  Output --  Net 747.42 ml   Filed Weights   01/31/21 2346  Weight: 74.8 kg   Weight change:  Body mass index is 21.77 kg/m.   Physical Exam: General exam: Pleasant, middle-aged African-American male.  Mild to moderate distress because of nausea Skin: No rashes, lesions or ulcers. HEENT: Atraumatic, normocephalic, no obvious bleeding Lungs: Clear to auscultation bilaterally CVS: Regular rate and rhythm, no murmur GI/Abd soft, mild to moderate diffuse abdominal tenderness mostly in the epigastrium, bowel sound present CNS: Alert, awake, oriented x3 Psychiatry: Mood appropriate Extremities: No pedal edema, no calf tenderness  Data Review: I have personally reviewed the laboratory data and studies available.  Recent Labs  Lab 01/31/21 2347 02/01/21 0004 02/02/21 0156  WBC 11.1*  --  5.4  NEUTROABS 8.1*  --  3.2  HGB 13.4 14.3 11.5*  HCT 40.1 42.0 35.2*  MCV 97.6  --  98.9  PLT 235  --  215   Recent Labs  Lab 01/31/21 2347 02/01/21 0004 02/02/21 0156  NA 133* 138 136  K 3.1* 3.1* 3.8  CL 102 103 100  CO2 21*  --  28  GLUCOSE 121* 119* 101*  BUN 5* <3* <5*  CREATININE 0.97 0.90 0.95  CALCIUM 9.0  --  8.8*  MG  --   --  1.7    F/u labs ordered Unresulted Labs (From admission, onward)     Start     Ordered   02/02/21 0500  Lipase, blood  Daily,   R      02/01/21 0535   02/02/21 0500  CBC with Differential/Platelet  Daily,   R      02/01/21 1509   02/02/21 0500  Magnesium  Daily,   R      02/01/21 1509            Signed, Terrilee Croak, MD [facility] 02/02/2021

## 2021-02-03 ENCOUNTER — Ambulatory Visit (HOSPITAL_COMMUNITY): Payer: Managed Care, Other (non HMO)

## 2021-02-03 LAB — COMPREHENSIVE METABOLIC PANEL
ALT: 15 U/L (ref 0–44)
AST: 21 U/L (ref 15–41)
Albumin: 3.1 g/dL — ABNORMAL LOW (ref 3.5–5.0)
Alkaline Phosphatase: 47 U/L (ref 38–126)
Anion gap: 5 (ref 5–15)
BUN: <5 mg/dL — ABNORMAL LOW (ref 6–20)
CO2: 29 mmol/L (ref 22–32)
Calcium: 9.1 mg/dL (ref 8.9–10.3)
Chloride: 101 mmol/L (ref 98–111)
Creatinine, Ser: 0.92 mg/dL (ref 0.61–1.24)
GFR, Estimated: >60 mL/min (ref >60)
Glucose, Bld: 101 mg/dL — ABNORMAL HIGH (ref 70–99)
Potassium: 3.8 mmol/L (ref 3.5–5.1)
Sodium: 135 mmol/L (ref 135–145)
Total Bilirubin: 0.9 mg/dL (ref 0.3–1.2)
Total Protein: 6.2 g/dL — ABNORMAL LOW (ref 6.5–8.1)

## 2021-02-03 LAB — GLUCOSE, CAPILLARY
Glucose-Capillary: 115 mg/dL — ABNORMAL HIGH (ref 70–99)
Glucose-Capillary: 128 mg/dL — ABNORMAL HIGH (ref 70–99)
Glucose-Capillary: 114 mg/dL — ABNORMAL HIGH (ref 70–99)
Glucose-Capillary: 113 mg/dL — ABNORMAL HIGH (ref 70–99)

## 2021-02-03 LAB — CBC WITH DIFFERENTIAL/PLATELET
Abs Immature Granulocytes: 0.01 10*3/uL (ref 0.00–0.07)
Basophils Absolute: 0 10*3/uL (ref 0.0–0.1)
Basophils Relative: 1 %
Eosinophils Absolute: 0 10*3/uL (ref 0.0–0.5)
Eosinophils Relative: 1 %
HCT: 35.4 % — ABNORMAL LOW (ref 39.0–52.0)
Hemoglobin: 11.7 g/dL — ABNORMAL LOW (ref 13.0–17.0)
Immature Granulocytes: 0 %
Lymphocytes Relative: 43 %
Lymphs Abs: 2.1 10*3/uL (ref 0.7–4.0)
MCH: 32.4 pg (ref 26.0–34.0)
MCHC: 33.1 g/dL (ref 30.0–36.0)
MCV: 98.1 fL (ref 80.0–100.0)
Monocytes Absolute: 0.4 10*3/uL (ref 0.1–1.0)
Monocytes Relative: 8 %
Neutro Abs: 2.3 10*3/uL (ref 1.7–7.7)
Neutrophils Relative %: 47 %
Platelets: 203 10*3/uL (ref 150–400)
RBC: 3.61 MIL/uL — ABNORMAL LOW (ref 4.22–5.81)
RDW: 15.6 % — ABNORMAL HIGH (ref 11.5–15.5)
WBC: 4.8 10*3/uL (ref 4.0–10.5)
nRBC: 0 % (ref 0.0–0.2)

## 2021-02-03 LAB — LIPASE, BLOOD: Lipase: 32 U/L (ref 11–51)

## 2021-02-03 LAB — MAGNESIUM: Magnesium: 1.7 mg/dL (ref 1.7–2.4)

## 2021-02-03 MED ORDER — LACTATED RINGERS IV SOLN: INTRAVENOUS | Status: AC

## 2021-02-03 MED ORDER — ADULT MULTIVITAMIN W/MINERALS CH
1.0000 | ORAL_TABLET | Freq: Every day | ORAL | Status: DC
  Administered 2021-02-03 – 2021-02-04 (×2): 1 via ORAL
  Filled 2021-02-03 (×2): qty 1

## 2021-02-03 MED ORDER — ENSURE ENLIVE PO LIQD
237.0000 mL | Freq: Two times a day (BID) | ORAL | Status: DC
  Filled 2021-02-03: qty 237

## 2021-02-03 NOTE — Progress Notes (Signed)
PROGRESS NOTE  Kyle Berry  DOB: 1971-02-09  PCP: [name] (Inactive) FTD:[id number]  DOA: 01/31/2021  LOS: 2 days  Hospital Day: 4   Chief Complaint  Patient presents with   Dysphagia   Brief narrative: Kyle Berry is a 50 y.o. male with PMH significant for recurrent pancreatitis d/t pancreatic divisum, HTN, GERD. Patient presented to the ED on 10/18 with complaint of 1 day history of progressively worsening abdominal pain. Patient had a nuclear stress test the day before admission and in the past has tolerated contrast but this time, he felt a burning in his stomach after this test.   In the ED, patient was hemodynamically stable, lipase level was mildly elevated to 61. CT abdomen pelvis showed some mild peripancreatic inflammatory changes consistent with mild pancreatitis. Because of his symptoms, he was admitted for further evaluation management. See below for details  Subjective: Patient was seen and examined this morning.  Sitting up at the edge of the bed.  Continues to complain of epigastric pain.  He states that he threw up this morning on the sink and the nursing staff were not able to observe it. He has been given a basin to throw up if needed again. 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  -Presented with abdominal pain after getting contrast dye.  -Stable findings CT scan and lipase level slightly elevated.   -Because of persistent pain and vomiting, patient was admitted for acute pancreatitis.   -Currently getting conservative management given with IV fluid, pain control, bowel rest.  Patient states he continues to have pain and vomiting but also wants to continue full liquid diet.  Currently on IV Phenergan.  Continue to carefully monitor symptoms.  ?  Blood in the vomitus -Patient reports that he had an episode of vomiting yesterday and  he saw blood in there. -Hemoglobin is stable at 11.7 today.  BUN not elevated. -Patient reports no vomiting after 1 episode yesterday.  Continue to monitor for now. Recent Labs    12/29/20 0502 01/31/21 2347 02/01/21 0004 02/02/21 0156 02/03/21 0150  HGB 11.2* 13.4 14.3 11.5* 11.7*  MCV 99.4 97.6  --  98.9 98.1    Pancreatic insufficiency -Resume Creon.   Congenital single kidney -Stable renal function. Recent Labs    12/24/20 0600 12/26/20 0628 12/28/20 0232 12/29/20 0502 12/30/20 0216 12/31/20 0241 01/31/21 2347 02/01/21 0004 02/02/21 0156 02/03/21 0150  BUN <5* <5* <5* <5* 6 7 5* <3* <5* <5*  CREATININE 1.00 1.11 1.10 1.26* 1.16 1.22 0.97 0.90 0.95 0.92     Esophageal reflux -Continue pepcid   Essential hypertension -Telmisartan at home.  Currently on irbesartan.  Mobility: Encourage ambulation Code Status:   Code Status: Full Code  Nutritional status: Body mass index is 21.77 kg/m.     Diet:  Diet Order             Diet full liquid Room service appropriate? Yes; Fluid consistency: Thin  Diet effective now                  DVT prophylaxis:  enoxaparin (LOVENOX) injection 40 mg Start: 02/01/21 1600 SCDs Start: 02/01/21 0536   Antimicrobials: None Fluid: LR@100  mill per hour to continue Consultants: None Family Communication: None at bedside  Status is: Inpatient  Remains inpatient appropriate because: Unable to discharge as patient continues to have symptoms  Dispo: The patient is from: Home  Anticipated d/c is to: Home in 1 to 2 days              Patient currently is not medically stable to d/c.   Difficult to place patient No     Infusions:   lactated ringers 100 mL/hr at 02/03/21 0540   promethazine (PHENERGAN) injection (IM or IVPB) 12.5 mg (02/03/21 1008)    Scheduled Meds:  enoxaparin (LOVENOX) injection  40 mg Subcutaneous Q24H   famotidine  20 mg Oral BID   insulin aspart  0-5 Units Subcutaneous QHS    insulin aspart  0-9 Units Subcutaneous TID WC   irbesartan  150 mg Oral Daily   lipase/protease/amylase  72,000 Units Oral TID WC    Antimicrobials: Anti-infectives (From admission, onward)    None       PRN meds: acetaminophen **OR** acetaminophen, HYDROmorphone (DILAUDID) injection, melatonin, promethazine (PHENERGAN) injection (IM or IVPB)   Objective: Vitals:   02/03/21 0533 02/03/21 0728  BP: (!) 154/94 (!) 161/96  Pulse: (!) 57 63  Resp: 17 16  Temp: (!) 97.5 F (36.4 C) 98.2 F (36.8 C)  SpO2: 100% 100%    Intake/Output Summary (Last 24 hours) at 02/03/2021 1320 Last data filed at 02/03/2021 0545 Gross per 24 hour  Intake 4948.14 ml  Output --  Net 4948.14 ml    Filed Weights   01/31/21 2346  Weight: 74.8 kg   Weight change:  Body mass index is 21.77 kg/m.   Physical Exam: General exam: Pleasant, middle-aged African-American male.  Mild to moderate distress because of nausea  Skin: No rashes, lesions or ulcers. HEENT: Atraumatic, normocephalic, no obvious bleeding Lungs: Clear to auscultation bilaterally CVS: Rate and rhythm, no murmur GI/Abd soft, mild to moderate diffuse abdominal tenderness mostly in the epigastrium, bowel sound present CNS: Alert, awake, oriented x3 Psychiatry: Mood appropriate Extremities: No pedal edema, no calf tenderness  Data Review: I have personally reviewed the laboratory data and studies available.  Recent Labs  Lab 01/31/21 2347 02/01/21 0004 02/02/21 0156 02/03/21 0150  WBC 11.1*  --  5.4 4.8  NEUTROABS 8.1*  --  3.2 2.3  HGB 13.4 14.3 11.5* 11.7*  HCT 40.1 42.0 35.2* 35.4*  MCV 97.6  --  98.9 98.1  PLT 235  --  215 203    Recent Labs  Lab 01/31/21 2347 02/01/21 0004 02/02/21 0156 02/03/21 0150  NA 133* 138 136 135  K 3.1* 3.1* 3.8 3.8  CL 102 103 100 101  CO2 21*  --  28 29  GLUCOSE 121* 119* 101* 101*  BUN 5* <3* <5* <5*  CREATININE 0.97 0.90 0.95 0.92  CALCIUM 9.0  --  8.8* 9.1  MG  --   --   1.7 1.7     F/u labs ordered Unresulted Labs (From admission, onward)     Start     Ordered   02/03/21 0500  Comprehensive metabolic panel  Daily,   R      02/02/21 1455   02/02/21 0500  Lipase, blood  Daily,   R      02/01/21 0535   02/02/21 0500  CBC with Differential/Platelet  Daily,   R      02/01/21 1509   02/02/21 0500  Magnesium  Daily,   R      02/01/21 1509            Signed, Terrilee Croak, MD [facility] 02/03/2021

## 2021-02-03 NOTE — Progress Notes (Signed)
Initial Nutrition Assessment  DOCUMENTATION CODES:   Non-severe (moderate) malnutrition in context of chronic illness  INTERVENTION:   Multivitamin w/ minerals daily  Ensure Enlive po BID, each supplement provides 350 kcal and 20 grams of protein  Advance diet as medically appropriate per MD.  NUTRITION DIAGNOSIS:   Moderate Malnutrition related to chronic illness (Pancreatitis) as evidenced by moderate fat depletion, moderate muscle depletion.  GOAL:   Patient will meet greater than or equal to 90% of their needs  MONITOR:   PO intake, Supplement acceptance, Diet advancement, Labs  REASON FOR ASSESSMENT:   Malnutrition Screening Tool    ASSESSMENT:   50 y.o. male presents to the ED with worsening abdominal pain. PMH includes HTN, GERD, chronic pancreatitis, and moderate malnutrition. Pt admitted with acute on chronic pancreatitis.   10/19: NPO -> CLD 10/20: FLD  Pt reports that he was doing great prior to admission and was eating well. He started to not eat well when he was experiencing pain and then presented to the ED. Pt reports that he has has some episodes of emesis since admission, but overall doing better. Reports receiving Boost from nursing, discussed transitioning to Ensure; pt agreeable to ONS.  Per pt, he was unable to recall his UBW, but has not noticed any weight loss. Per EMR, pt has not has any significant weight loss.  Medications reviewed and include: Pepcid, Ssi 0-5 units daily + 0-9 units TID, Creon  Labs reviewed: BUN <5  NUTRITION - FOCUSED PHYSICAL EXAM:  Flowsheet Row Most Recent Value  Orbital Region No depletion  Upper Arm Region Moderate depletion  Thoracic and Lumbar Region Moderate depletion  Buccal Region No depletion  Temple Region No depletion  Clavicle Bone Region Mild depletion  Clavicle and Acromion Bone Region Mild depletion  Scapular Bone Region Mild depletion  Dorsal Hand Mild depletion  Patellar Region Moderate depletion   Anterior Thigh Region Moderate depletion  Posterior Calf Region Moderate depletion  Edema (RD Assessment) None  Hair Unable to assess  [Cap]  Eyes Reviewed  Mouth Reviewed  Skin Reviewed  Nails Reviewed      Diet Order:   Diet Order             Diet full liquid Room service appropriate? Yes; Fluid consistency: Thin  Diet effective now                   EDUCATION NEEDS:   No education needs have been identified at this time  Skin:  Skin Assessment: Reviewed RN Assessment  Last BM:  02/01/2021  Height:   Ht Readings from Last 1 Encounters:  01/31/21 6\' 1"  (1.854 m)    Weight:   Wt Readings from Last 1 Encounters:  01/31/21 74.8 kg    Ideal Body Weight:  83.6 kg  BMI:  Body mass index is 21.77 kg/m.  Estimated Nutritional Needs:   Kcal:  2200-2400  Protein:  110-125 grams  Fluid:  >/= 2 L    Kevionna Heffler BS, PLDN Clinical Dietitian See AMiON for contact information.

## 2021-02-04 LAB — COMPREHENSIVE METABOLIC PANEL
ALT: 16 U/L (ref 0–44)
AST: 24 U/L (ref 15–41)
Albumin: 3.3 g/dL — ABNORMAL LOW (ref 3.5–5.0)
Alkaline Phosphatase: 49 U/L (ref 38–126)
Anion gap: 8 (ref 5–15)
BUN: <5 mg/dL — ABNORMAL LOW (ref 6–20)
CO2: 30 mmol/L (ref 22–32)
Calcium: 9.6 mg/dL (ref 8.9–10.3)
Chloride: 99 mmol/L (ref 98–111)
Creatinine, Ser: 1.06 mg/dL (ref 0.61–1.24)
GFR, Estimated: >60 mL/min (ref >60)
Glucose, Bld: 101 mg/dL — ABNORMAL HIGH (ref 70–99)
Potassium: 4 mmol/L (ref 3.5–5.1)
Sodium: 137 mmol/L (ref 135–145)
Total Bilirubin: 0.6 mg/dL (ref 0.3–1.2)
Total Protein: 6.3 g/dL — ABNORMAL LOW (ref 6.5–8.1)

## 2021-02-04 LAB — CBC WITH DIFFERENTIAL/PLATELET
Abs Immature Granulocytes: 0.02 10*3/uL (ref 0.00–0.07)
Basophils Absolute: 0 10*3/uL (ref 0.0–0.1)
Basophils Relative: 0 %
Eosinophils Absolute: 0.1 10*3/uL (ref 0.0–0.5)
Eosinophils Relative: 1 %
HCT: 36.1 % — ABNORMAL LOW (ref 39.0–52.0)
Hemoglobin: 12 g/dL — ABNORMAL LOW (ref 13.0–17.0)
Immature Granulocytes: 0 %
Lymphocytes Relative: 43 %
Lymphs Abs: 2.3 10*3/uL (ref 0.7–4.0)
MCH: 33.1 pg (ref 26.0–34.0)
MCHC: 33.2 g/dL (ref 30.0–36.0)
MCV: 99.4 fL (ref 80.0–100.0)
Monocytes Absolute: 0.4 10*3/uL (ref 0.1–1.0)
Monocytes Relative: 7 %
Neutro Abs: 2.6 10*3/uL (ref 1.7–7.7)
Neutrophils Relative %: 49 %
Platelets: 192 10*3/uL (ref 150–400)
RBC: 3.63 MIL/uL — ABNORMAL LOW (ref 4.22–5.81)
RDW: 15.9 % — ABNORMAL HIGH (ref 11.5–15.5)
WBC: 5.4 10*3/uL (ref 4.0–10.5)
nRBC: 0 % (ref 0.0–0.2)

## 2021-02-04 LAB — GLUCOSE, CAPILLARY
Glucose-Capillary: 99 mg/dL (ref 70–99)
Glucose-Capillary: 112 mg/dL — ABNORMAL HIGH (ref 70–99)

## 2021-02-04 LAB — LIPASE, BLOOD: Lipase: 29 U/L (ref 11–51)

## 2021-02-04 LAB — MAGNESIUM: Magnesium: 1.7 mg/dL (ref 1.7–2.4)

## 2021-02-04 MED ORDER — ENSURE ENLIVE PO LIQD
237.0000 mL | Freq: Two times a day (BID) | ORAL | 12 refills | Status: DC
Start: 2021-02-04 — End: 2022-09-28

## 2021-02-04 MED ORDER — PROMETHAZINE HCL 25 MG PO TABS: 25.0000 mg | ORAL_TABLET | Freq: Four times a day (QID) | ORAL | 0 refills | Status: DC | PRN

## 2021-02-04 NOTE — Discharge Summary (Signed)
Physician Discharge Summary  Kyle Berry EUM:[id number] DOB: June 18, 1970 DOA: 01/31/2021  PCP: [name] (Inactive)  Admit date: 01/31/2021 Discharge date: 02/04/2021  Admitted From: Home Discharge disposition: Home   Code Status: Full Code   Discharge Diagnosis:  Principal Problem:   Acute on chronic pancreatitis [facility]) Active Problems:   Essential hypertension   Esophageal reflux   Congenital single kidney   Pancreatic insufficiency    Chief Complaint  Patient presents with   Dysphagia   Brief narrative: Kyle Berry is a 50 y.o. male with PMH significant for recurrent pancreatitis d/t pancreatic divisum, HTN, GERD. Patient presented to the ED on 10/18 with complaint of 1 day history of progressively worsening abdominal pain. Patient had a nuclear stress test the day before admission and in the past has tolerated contrast but this time, he felt a burning in his stomach after this test.   In the ED, patient was hemodynamically stable, lipase level was mildly elevated to 61. CT abdomen pelvis showed some mild peripancreatic inflammatory changes consistent with mild pancreatitis. Because of his symptoms, he was admitted for further evaluation management. See below for details  Subjective: Patient was seen and examined this morning.  Lying down in bed.  No vomiting since yesterday once we asked him to vomit only on the given basin and not in the bathroom.  Patient states he continues to have nausea and abdominal pain.  Tolerating full liquid diet.  Feels ready to go home today.  Hospital course Acute on chronic pancreatitis  -History of recurrent pancreatitis due to pancreatic divisum.  -Presented with abdominal pain after getting contrast dye.  -Stable findings CT scan and lipase level slightly elevated.   -Because of persistent pain and vomiting, patient was admitted for acute pancreatitis.   -Currently getting conservative management given  with IV fluid, pain control, bowel rest.  Tolerating full liquid diet.  Vomiting improved.  Nausea remains.  Abdominal tenderness on exam is inconsistent and significantly less especially when examined with distraction.   -Okay to Phenergan for nausea at home as before.  Reported blood in the vomitus -Patient reports that he had an episode of vomiting yesterday and he saw blood in there. -Hemoglobin is stable at 11.7 today.  BUN not elevated. -No further vomiting Recent Labs    01/31/21 2347 02/01/21 0004 02/02/21 0156 02/03/21 0150 02/04/21 0120  HGB 13.4 14.3 11.5* 11.7* 12.0*  MCV 97.6  --  98.9 98.1 99.4   Pancreatic insufficiency -Continue Creon.   Congenital single kidney -Stable renal function. Recent Labs    12/26/20 0628 12/28/20 0232 12/29/20 0502 12/30/20 0216 12/31/20 0241 01/31/21 2347 02/01/21 0004 02/02/21 0156 02/03/21 0150 02/04/21 0120  BUN <5* <5* <5* 6 7 5* <3* <5* <5* <5*  CREATININE 1.11 1.10 1.26* 1.16 1.22 0.97 0.90 0.95 0.92 1.06    Esophageal reflux -Continue pepcid   Essential hypertension -Telmisartan at home.  Continue the same.   Allergies as of 02/04/2021       Reactions   Diclofenac Hives   Norvasc [amlodipine Besylate] Other (See Comments)   Fluid buildup in chest   Omeprazole Other (See Comments)   MD stopped due to pancreatitis   Prednisone Other (See Comments)   Mood swings        Medication List     STOP taking these medications    multivitamin with minerals Tabs tablet       TAKE these medications    famotidine 20 MG tablet Commonly  known as: PEPCID Take 1 tablet (20 mg total) by mouth 2 (two) times daily. Instructions: Take Pepcid 20 mg twice daily x30 days, then take Pepcid 20 mg daily thereafter. What changed: when to take this   feeding supplement Liqd Take 237 mLs by mouth 2 (two) times daily between meals.   lipase/protease/amylase 36000 UNITS Cpep capsule Commonly known as: CREON Take 2  capsules (72,000 Units total) by mouth 3 (three) times daily with meals.   promethazine 25 MG tablet Commonly known as: PHENERGAN Take 1 tablet (25 mg total) by mouth every 6 (six) hours as needed for up to 5 days for nausea or vomiting.   telmisartan 40 MG tablet Commonly known as: MICARDIS Take 40 mg by mouth daily.   VISINE-A OP Place 1 drop into both eyes daily as needed (red eyes).        Discharge Instructions:  Diet Recommendation: Currently on full liquid diet.  Gradually advance to soft and regular diet in an interval of few days.   @BRDDSCINSTRUCTIONS @  Follow ups:    Follow-up Information     [name], [name] Follow up.   Contact information: [place] [facility] [place] [address] [phone]         Skeet Latch, MD .   Specialty: Cardiology Contact information: [address] [place] [address] [phone]                 Wound care:     Discharge Exam:   Vitals:   02/03/21 1602 02/03/21 2136 02/04/21 0437 02/04/21 0733  BP: (!) 143/94 (!) 174/105 (!) 141/92 (!) 145/94  Pulse: 62 63 73 60  Resp: 16 15 16 16   Temp: 98.2 F (36.8 C) 97.9 F (36.6 C) 98.1 F (36.7 C) 98.3 F (36.8 C)  TempSrc: Oral Oral Oral Oral  SpO2: 100% 100% 99% 99%  Weight:      Height:        Body mass index is 21.77 kg/m.  General exam: Middle-aged African-American male. Skin: No rashes, lesions or ulcers. HEENT: Atraumatic, normocephalic, no obvious bleeding Lungs: Clear to auscultate bilaterally CVS: Regular rate and rhythm, no murmur GI/Abd soft, mild inconsistent tenderness in epigastrium, bowel sound present CNS: Alert, awake, oriented x3 Psychiatry: Mood appropriate Extremities: No pedal edema, no calf tenderness  Time coordinating discharge: 35 minutes   The results of significant diagnostics from this hospitalization (including imaging, microbiology, ancillary and  laboratory) are listed below for reference.    Procedures and Diagnostic Studies:   CT Soft Tissue Neck W Contrast  Result Date: 02/01/2021 CLINICAL DATA:  CN 9 cranial neuropathy, difficulty swallowing EXAM: CT NECK WITH CONTRAST TECHNIQUE: Multidetector CT imaging of the neck was performed using the standard protocol following the bolus administration of intravenous contrast. CONTRAST:  37mL OMNIPAQUE IOHEXOL 350 MG/ML SOLN COMPARISON:  None. FINDINGS: Pharynx and larynx: Normal. No mass or swelling. Salivary glands: No inflammation, mass, or stone. Thyroid: Normal. Lymph nodes: None enlarged or abnormal density. Vascular: Negative. Limited intracranial: Negative. Visualized orbits: Negative. Mastoids and visualized paranasal sinuses: Clear. Skeleton: Mild reversal of the normal cervical lordosis. No acute osseous abnormality. Upper chest: For findings in the thorax, please see same day CT chest. Other: None. IMPRESSION: No acute process in the neck. Electronically Signed   By: Merilyn Baba M.D.   On: 02/01/2021 01:42   CT CHEST W CONTRAST  Result Date: 02/01/2021 CLINICAL DATA:  Recent cardiac nuclear scan with subsequent  difficulty swallowing, initial encounter EXAM: CT CHEST WITH CONTRAST TECHNIQUE: Multidetector CT imaging of the chest was performed during intravenous contrast administration. CONTRAST:  43mL OMNIPAQUE IOHEXOL 350 MG/ML SOLN COMPARISON:  12/23/2020 FINDINGS: Cardiovascular: Thoracic aorta is within normal limits without aneurysmal dilatation or dissection. No cardiac enlargement is seen. No coronary calcifications are noted. Pulmonary artery as visualized is within normal limits although not timed for embolus evaluation. Mediastinum/Nodes: Thoracic inlet is within normal limits. The esophagus as visualized is within normal limits. No sizable hilar or mediastinal adenopathy is seen. Lungs/Pleura: Lungs are well aerated bilaterally. Minimal left basilar atelectasis is seen. No focal  infiltrate or effusion is noted. Musculoskeletal: No acute bony abnormality is noted. IMPRESSION: Minimal left basilar atelectasis. No other focal abnormality is seen. Electronically Signed   By: Inez Catalina M.D.   On: 02/01/2021 01:33   CT ABDOMEN PELVIS W CONTRAST  Result Date: 02/01/2021 CLINICAL DATA:  Epigastric pain and difficulty swallowing EXAM: CT ABDOMEN AND PELVIS WITH CONTRAST TECHNIQUE: Multidetector CT imaging of the abdomen and pelvis was performed using the standard protocol following bolus administration of intravenous contrast. CONTRAST:  81mL OMNIPAQUE IOHEXOL 350 MG/ML SOLN COMPARISON:  12/23/2020 FINDINGS: Hepatobiliary: Fatty infiltration of the liver is noted. Gallbladder has been surgically removed. Pancreas: Pancreas is well visualized. Some mild peripancreatic inflammatory changes again noted consistent with pancreatitis. The overall appearance is stable. No pseudocyst is seen. Spleen: Normal in size without focal abnormality. Adrenals/Urinary Tract: Adrenal glands are within normal limits. Right kidney is again atrophic in nature. Left kidney is somewhat hypertrophied. No calculi are seen as contrast material has already been excreted in the collecting system. No obstructive changes are noted. The bladder is partially distended. Stomach/Bowel: No obstructive or inflammatory changes of the colon are noted. The appendix is within normal limits. No small bowel abnormality is seen. Stomach is partially distended with fluid. Vascular/Lymphatic: No significant vascular findings are present. No enlarged abdominal or pelvic lymph nodes. Reproductive: Prostate is unremarkable. Other: No abdominal wall hernia or abnormality. No abdominopelvic ascites. Musculoskeletal: Degenerative changes of lumbar spine are noted. No acute bony abnormality is seen. IMPRESSION: Mild peripancreatic inflammatory changes similar to that seen on the prior exam consistent with mild pancreatitis. No pseudocyst is  seen. Chronic right renal atrophy. Previously seen left renal calculus is not appreciated on this exam given the contrast administration. Fatty liver. Electronically Signed   By: Inez Catalina M.D.   On: 02/01/2021 01:31     Labs:   Basic Metabolic Panel: Recent Labs  Lab 01/31/21 2347 02/01/21 0004 02/02/21 0156 02/03/21 0150 02/04/21 0120  NA 133* 138 136 135 137  K 3.1* 3.1* 3.8 3.8 4.0  CL 102 103 100 101 99  CO2 21*  --  28 29 30   GLUCOSE 121* 119* 101* 101* 101*  BUN 5* <3* <5* <5* <5*  CREATININE 0.97 0.90 0.95 0.92 1.06  CALCIUM 9.0  --  8.8* 9.1 9.6  MG  --   --  1.7 1.7 1.7   GFR Estimated Creatinine Clearance: 87.2 mL/min (by C-G formula based on SCr of 1.06 mg/dL). Liver Function Tests: Recent Labs  Lab 01/31/21 2347 02/02/21 0156 02/03/21 0150 02/04/21 0120  AST 27 18 21 24   ALT 18 15 15 16   ALKPHOS 62 50 47 49  BILITOT 1.0 1.1 0.9 0.6  PROT 7.4 6.1* 6.2* 6.3*  ALBUMIN 3.8 3.0* 3.1* 3.3*   Recent Labs  Lab 01/31/21 2347 02/02/21 0156 02/03/21 0150 02/04/21 0120  LIPASE  61* 43 32 29   No results for input(s): AMMONIA in the last 168 hours. Coagulation profile No results for input(s): INR, PROTIME in the last 168 hours.  CBC: Recent Labs  Lab 01/31/21 2347 02/01/21 0004 02/02/21 0156 02/03/21 0150 02/04/21 0120  WBC 11.1*  --  5.4 4.8 5.4  NEUTROABS 8.1*  --  3.2 2.3 2.6  HGB 13.4 14.3 11.5* 11.7* 12.0*  HCT 40.1 42.0 35.2* 35.4* 36.1*  MCV 97.6  --  98.9 98.1 99.4  PLT 235  --  215 203 192   Cardiac Enzymes: No results for input(s): CKTOTAL, CKMB, CKMBINDEX, TROPONINI in the last 168 hours. BNP: Invalid input(s): POCBNP CBG: Recent Labs  Lab 02/03/21 1104 02/03/21 1624 02/03/21 2141 02/04/21 0800 02/04/21 1107  GLUCAP 128* 114* 113* 99 112*   D-Dimer No results for input(s): DDIMER in the last 72 hours. Hgb A1c Recent Labs    02/02/21 0156  HGBA1C 5.4   Lipid Profile No results for input(s): CHOL, HDL, LDLCALC, TRIG,  CHOLHDL, LDLDIRECT in the last 72 hours. Thyroid function studies No results for input(s): TSH, T4TOTAL, T3FREE, THYROIDAB in the last 72 hours.  Invalid input(s): FREET3 Anemia work up No results for input(s): VITAMINB12, FOLATE, FERRITIN, TIBC, IRON, RETICCTPCT in the last 72 hours. Microbiology Recent Results (from the past 240 hour(s))  Resp Panel by RT-PCR (Flu A&B, Covid) Nasopharyngeal Swab     Status: None   Collection Time: 02/01/21  1:24 AM   Specimen: Nasopharyngeal Swab; Nasopharyngeal(NP) swabs in vial transport medium  Result Value Ref Range Status   SARS Coronavirus 2 by RT PCR NEGATIVE NEGATIVE Final    Comment: (NOTE) SARS-CoV-2 target nucleic acids are NOT DETECTED.  The SARS-CoV-2 RNA is generally detectable in upper respiratory specimens during the acute phase of infection. The lowest concentration of SARS-CoV-2 viral copies this assay can detect is 138 copies/mL. A negative result does not preclude SARS-Cov-2 infection and should not be used as the sole basis for treatment or other patient management decisions. A negative result may occur with  improper specimen collection/handling, submission of specimen other than nasopharyngeal swab, presence of viral mutation(s) within the areas targeted by this assay, and inadequate number of viral copies(<138 copies/mL). A negative result must be combined with clinical observations, patient history, and epidemiological information. The expected result is Negative.  Fact Sheet for Patients:  EntrepreneurPulse.com.au  Fact Sheet for Healthcare Providers:  IncredibleEmployment.be  This test is no t yet approved or cleared by the [place] FDA and  has been authorized for detection and/or diagnosis of SARS-CoV-2 by FDA under an Emergency Use Authorization (EUA). This EUA will remain  in effect (meaning this test can be used) for the duration of the COVID-19 declaration under Section  564(b)(1) of the Act, 21 U.S.C.section 360bbb-3(b)(1), unless the authorization is terminated  or revoked sooner.       Influenza A by PCR NEGATIVE NEGATIVE Final   Influenza B by PCR NEGATIVE NEGATIVE Final    Comment: (NOTE) The Xpert Xpress SARS-CoV-2/FLU/RSV plus assay is intended as an aid in the diagnosis of influenza from Nasopharyngeal swab specimens and should not be used as a sole basis for treatment. Nasal washings and aspirates are unacceptable for Xpert Xpress SARS-CoV-2/FLU/RSV testing.  Fact Sheet for Patients: EntrepreneurPulse.com.au  Fact Sheet for Healthcare Providers: IncredibleEmployment.be  This test is not yet approved or cleared by the [place] FDA and has been authorized for detection and/or diagnosis of SARS-CoV-2 by FDA under an  Emergency Use Authorization (EUA). This EUA will remain in effect (meaning this test can be used) for the duration of the COVID-19 declaration under Section 564(b)(1) of the Act, 21 U.S.C. section 360bbb-3(b)(1), unless the authorization is terminated or revoked.  Performed at [facility], [place] [address] [place], [place] [address]      Signed: Terrilee Croak  [facility] 02/04/2021, 11:53 AM

## 2021-02-07 ENCOUNTER — Ambulatory Visit (HOSPITAL_COMMUNITY): Payer: Managed Care, Other (non HMO)

## 2021-02-07 ENCOUNTER — Encounter (HOSPITAL_COMMUNITY): Payer: Self-pay

## 2021-02-24 ENCOUNTER — Other Ambulatory Visit (HOSPITAL_COMMUNITY): Payer: Self-pay | Admitting: Physician Assistant

## 2021-02-24 DIAGNOSIS — R072 Precordial pain: Secondary | ICD-10-CM

## 2021-02-28 ENCOUNTER — Other Ambulatory Visit: Payer: Self-pay

## 2021-02-28 ENCOUNTER — Ambulatory Visit (HOSPITAL_COMMUNITY): Payer: Managed Care, Other (non HMO) | Attending: Physician Assistant

## 2021-02-28 DIAGNOSIS — R072 Precordial pain: Secondary | ICD-10-CM | POA: Diagnosis not present

## 2021-02-28 LAB — MYOCARDIAL PERFUSION IMAGING
LV dias vol: 98 mL (ref 62–150)
LV sys vol: 50 mL
MPHR: 104 {beats}/min
Nuc Stress EF: 49 %
Rest HR: 71 {beats}/min
Rest Nuclear Isotope Dose: 8.8 mCi
SDS: 0
SRS: 0
SSS: 0
ST Depression (mm): 0 mm
Stress Nuclear Isotope Dose: 29.5 mCi
TID: 0.94

## 2021-02-28 MED ORDER — REGADENOSON 0.4 MG/5ML IV SOLN
0.4000 mg | Freq: Once | INTRAVENOUS | Status: AC
Start: 2021-02-28 — End: 2021-02-28
  Administered 2021-02-28: 0.4 mg via INTRAVENOUS

## 2021-02-28 MED ORDER — TECHNETIUM TC 99M TETROFOSMIN IV KIT
29.5000 | PACK | Freq: Once | INTRAVENOUS | Status: AC | PRN
  Administered 2021-02-28: 29.5 via INTRAVENOUS
  Filled 2021-02-28: qty 30

## 2021-02-28 MED ORDER — TECHNETIUM TC 99M TETROFOSMIN IV KIT
8.8000 | PACK | Freq: Once | INTRAVENOUS | Status: AC | PRN
  Administered 2021-02-28: 8.8 via INTRAVENOUS
  Filled 2021-02-28: qty 9

## 2021-04-21 ENCOUNTER — Inpatient Hospital Stay (HOSPITAL_COMMUNITY)
Admission: EM | Admit: 2021-04-21 | Discharge: 2021-04-26 | DRG: 439 | Disposition: A | Discharge status: Home / Self Care | Payer: Managed Care, Other (non HMO) | Attending: Student in an Organized Health Care Education/Training Program | Admitting: Student in an Organized Health Care Education/Training Program

## 2021-04-21 ENCOUNTER — Emergency Department (HOSPITAL_COMMUNITY): Payer: Managed Care, Other (non HMO)

## 2021-04-21 ENCOUNTER — Other Ambulatory Visit: Payer: Self-pay

## 2021-04-21 DIAGNOSIS — Z800 Family history of malignant neoplasm of digestive organs: Secondary | ICD-10-CM

## 2021-04-21 DIAGNOSIS — Z888 Allergy status to other drugs, medicaments and biological substances status: Secondary | ICD-10-CM

## 2021-04-21 DIAGNOSIS — K3184 Gastroparesis: Secondary | ICD-10-CM | POA: Diagnosis present

## 2021-04-21 DIAGNOSIS — R109 Unspecified abdominal pain: Secondary | ICD-10-CM | POA: Diagnosis not present

## 2021-04-21 DIAGNOSIS — N200 Calculus of kidney: Secondary | ICD-10-CM | POA: Diagnosis present

## 2021-04-21 DIAGNOSIS — I10 Essential (primary) hypertension: Secondary | ICD-10-CM | POA: Diagnosis present

## 2021-04-21 DIAGNOSIS — Z91120 Patient's intentional underdosing of medication regimen due to financial hardship: Secondary | ICD-10-CM

## 2021-04-21 DIAGNOSIS — Q453 Other congenital malformations of pancreas and pancreatic duct: Secondary | ICD-10-CM

## 2021-04-21 DIAGNOSIS — G43909 Migraine, unspecified, not intractable, without status migrainosus: Secondary | ICD-10-CM | POA: Diagnosis present

## 2021-04-21 DIAGNOSIS — K8681 Exocrine pancreatic insufficiency: Secondary | ICD-10-CM | POA: Diagnosis present

## 2021-04-21 DIAGNOSIS — K861 Other chronic pancreatitis: Secondary | ICD-10-CM | POA: Diagnosis present

## 2021-04-21 DIAGNOSIS — Z20822 Contact with and (suspected) exposure to covid-19: Secondary | ICD-10-CM | POA: Diagnosis present

## 2021-04-21 DIAGNOSIS — K219 Gastro-esophageal reflux disease without esophagitis: Secondary | ICD-10-CM | POA: Diagnosis present

## 2021-04-21 DIAGNOSIS — K859 Acute pancreatitis without necrosis or infection, unspecified: Secondary | ICD-10-CM | POA: Diagnosis not present

## 2021-04-21 DIAGNOSIS — Z79899 Other long term (current) drug therapy: Secondary | ICD-10-CM

## 2021-04-21 DIAGNOSIS — N261 Atrophy of kidney (terminal): Secondary | ICD-10-CM | POA: Diagnosis present

## 2021-04-21 DIAGNOSIS — E7800 Pure hypercholesterolemia, unspecified: Secondary | ICD-10-CM | POA: Diagnosis present

## 2021-04-21 DIAGNOSIS — K921 Melena: Secondary | ICD-10-CM | POA: Diagnosis present

## 2021-04-21 DIAGNOSIS — K920 Hematemesis: Secondary | ICD-10-CM | POA: Diagnosis present

## 2021-04-21 DIAGNOSIS — Z8249 Family history of ischemic heart disease and other diseases of the circulatory system: Secondary | ICD-10-CM

## 2021-04-21 HISTORY — DX: Atrophy of kidney (terminal): N26.1

## 2021-04-21 LAB — COMPREHENSIVE METABOLIC PANEL
ALT: 30 U/L (ref 0–44)
AST: 52 U/L — ABNORMAL HIGH (ref 15–41)
Albumin: 4.1 g/dL (ref 3.5–5.0)
Alkaline Phosphatase: 69 U/L (ref 38–126)
Anion gap: 14 (ref 5–15)
BUN: 5 mg/dL — ABNORMAL LOW (ref 6–20)
CO2: 22 mmol/L (ref 22–32)
Calcium: 9.4 mg/dL (ref 8.9–10.3)
Chloride: 97 mmol/L — ABNORMAL LOW (ref 98–111)
Creatinine, Ser: 1.15 mg/dL (ref 0.61–1.24)
GFR, Estimated: >60 mL/min (ref >60)
Glucose, Bld: 153 mg/dL — ABNORMAL HIGH (ref 70–99)
Potassium: 3.5 mmol/L (ref 3.5–5.1)
Sodium: 133 mmol/L — ABNORMAL LOW (ref 135–145)
Total Bilirubin: 1.2 mg/dL (ref 0.3–1.2)
Total Protein: 7.8 g/dL (ref 6.5–8.1)

## 2021-04-21 LAB — CBC WITH DIFFERENTIAL/PLATELET
Abs Immature Granulocytes: 0.04 10*3/uL (ref 0.00–0.07)
Basophils Absolute: 0 10*3/uL (ref 0.0–0.1)
Basophils Relative: 0 %
Eosinophils Absolute: 0 10*3/uL (ref 0.0–0.5)
Eosinophils Relative: 0 %
HCT: 41.2 % (ref 39.0–52.0)
Hemoglobin: 14.3 g/dL (ref 13.0–17.0)
Immature Granulocytes: 0 %
Lymphocytes Relative: 12 %
Lymphs Abs: 1.2 10*3/uL (ref 0.7–4.0)
MCH: 34.2 pg — ABNORMAL HIGH (ref 26.0–34.0)
MCHC: 34.7 g/dL (ref 30.0–36.0)
MCV: 98.6 fL (ref 80.0–100.0)
Monocytes Absolute: 0.4 10*3/uL (ref 0.1–1.0)
Monocytes Relative: 4 %
Neutro Abs: 7.9 10*3/uL — ABNORMAL HIGH (ref 1.7–7.7)
Neutrophils Relative %: 84 %
Platelets: 187 10*3/uL (ref 150–400)
RBC: 4.18 MIL/uL — ABNORMAL LOW (ref 4.22–5.81)
RDW: 16.2 % — ABNORMAL HIGH (ref 11.5–15.5)
WBC: 9.6 10*3/uL (ref 4.0–10.5)
nRBC: 0 % (ref 0.0–0.2)

## 2021-04-21 LAB — URINALYSIS, ROUTINE W REFLEX MICROSCOPIC
Glucose, UA: NEGATIVE mg/dL
Ketones, ur: 40 mg/dL — AB
Leukocytes,Ua: NEGATIVE
Nitrite: NEGATIVE
Protein, ur: 100 mg/dL — AB
Specific Gravity, Urine: >1.03 — ABNORMAL HIGH (ref 1.005–1.030)
pH: 6 (ref 5.0–8.0)

## 2021-04-21 LAB — URINALYSIS, MICROSCOPIC (REFLEX): RBC / HPF: >50 RBC/hpf (ref 0–5)

## 2021-04-21 LAB — RESP PANEL BY RT-PCR (FLU A&B, COVID) ARPGX2
Influenza A by PCR: NEGATIVE
Influenza B by PCR: NEGATIVE
SARS Coronavirus 2 by RT PCR: NEGATIVE

## 2021-04-21 LAB — TYPE AND SCREEN
ABO/RH(D): O POS
Antibody Screen: NEGATIVE

## 2021-04-21 LAB — LIPASE, BLOOD: Lipase: 265 U/L — ABNORMAL HIGH (ref 11–51)

## 2021-04-21 MED ORDER — ONDANSETRON HCL 4 MG/2ML IJ SOLN
4.0000 mg | Freq: Once | INTRAMUSCULAR | Status: AC
  Administered 2021-04-21: 4 mg via INTRAVENOUS
  Filled 2021-04-21: qty 2

## 2021-04-21 MED ORDER — SODIUM CHLORIDE 0.9 % IV BOLUS
1000.0000 mL | Freq: Once | INTRAVENOUS | Status: AC
  Administered 2021-04-21: 1000 mL via INTRAVENOUS

## 2021-04-21 MED ORDER — LACTATED RINGERS IV SOLN: INTRAVENOUS | Status: AC

## 2021-04-21 MED ORDER — IOHEXOL 300 MG/ML  SOLN
100.0000 mL | Freq: Once | INTRAMUSCULAR | Status: AC | PRN
  Administered 2021-04-21: 100 mL via INTRAVENOUS

## 2021-04-21 MED ORDER — ONDANSETRON HCL 4 MG PO TABS: 4.0000 mg | ORAL_TABLET | Freq: Four times a day (QID) | ORAL | Status: DC | PRN

## 2021-04-21 MED ORDER — HYDROCODONE-ACETAMINOPHEN 5-325 MG PO TABS: 1.0000 | ORAL_TABLET | Freq: Four times a day (QID) | ORAL | Status: DC | PRN

## 2021-04-21 MED ORDER — SODIUM CHLORIDE 0.9 % IV SOLN
12.5000 mg | Freq: Four times a day (QID) | INTRAVENOUS | Status: DC | PRN
  Administered 2021-04-21 – 2021-04-26 (×10): 12.5 mg via INTRAVENOUS
  Filled 2021-04-21: qty 12.5
  Filled 2021-04-21: qty 0.5
  Filled 2021-04-21 (×3): qty 12.5
  Filled 2021-04-21: qty 0.5
  Filled 2021-04-21: qty 12.5
  Filled 2021-04-21: qty 0.5
  Filled 2021-04-21: qty 12.5
  Filled 2021-04-21 (×3): qty 0.5
  Filled 2021-04-21: qty 12.5

## 2021-04-21 MED ORDER — OXYCODONE-ACETAMINOPHEN 5-325 MG PO TABS
1.0000 | ORAL_TABLET | Freq: Once | ORAL | Status: AC
  Administered 2021-04-21: 1 via ORAL
  Filled 2021-04-21: qty 1

## 2021-04-21 MED ORDER — ONDANSETRON 4 MG PO TBDP
4.0000 mg | ORAL_TABLET | Freq: Once | ORAL | Status: AC
  Administered 2021-04-21: 4 mg via ORAL
  Filled 2021-04-21: qty 1

## 2021-04-21 MED ORDER — ACETAMINOPHEN 325 MG PO TABS
650.0000 mg | ORAL_TABLET | Freq: Four times a day (QID) | ORAL | Status: DC | PRN
  Administered 2021-04-26 (×2): 650 mg via ORAL
  Filled 2021-04-21 (×2): qty 2

## 2021-04-21 MED ORDER — ACETAMINOPHEN 650 MG RE SUPP: 650.0000 mg | Freq: Four times a day (QID) | RECTAL | Status: DC | PRN

## 2021-04-21 MED ORDER — HYDROMORPHONE HCL 1 MG/ML IJ SOLN
1.0000 mg | Freq: Once | INTRAMUSCULAR | Status: AC
  Administered 2021-04-21: 1 mg via INTRAVENOUS
  Filled 2021-04-21: qty 1

## 2021-04-21 MED ORDER — ENOXAPARIN SODIUM 40 MG/0.4ML IJ SOSY
40.0000 mg | PREFILLED_SYRINGE | INTRAMUSCULAR | Status: DC
  Administered 2021-04-22 – 2021-04-26 (×5): 40 mg via SUBCUTANEOUS
  Filled 2021-04-21 (×5): qty 0.4

## 2021-04-21 MED ORDER — HYDROMORPHONE HCL 1 MG/ML IJ SOLN
1.0000 mg | INTRAMUSCULAR | Status: DC | PRN
  Administered 2021-04-21 – 2021-04-25 (×22): 1 mg via INTRAVENOUS
  Filled 2021-04-21 (×22): qty 1

## 2021-04-21 MED ORDER — HYDROMORPHONE HCL 1 MG/ML IJ SOLN: 0.5000 mg | INTRAMUSCULAR | Status: DC | PRN

## 2021-04-21 MED ORDER — ONDANSETRON HCL 4 MG/2ML IJ SOLN: 4.0000 mg | Freq: Four times a day (QID) | INTRAMUSCULAR | Status: DC | PRN

## 2021-04-21 MED ORDER — MORPHINE SULFATE (PF) 4 MG/ML IV SOLN
4.0000 mg | Freq: Once | INTRAVENOUS | Status: AC
  Administered 2021-04-21: 4 mg via INTRAVENOUS
  Filled 2021-04-21: qty 1

## 2021-04-21 MED ORDER — IRBESARTAN 150 MG PO TABS
150.0000 mg | ORAL_TABLET | Freq: Every day | ORAL | Status: DC
  Administered 2021-04-22 – 2021-04-26 (×6): 150 mg via ORAL
  Filled 2021-04-21 (×7): qty 1

## 2021-04-21 MED ORDER — SODIUM CHLORIDE 0.9 % IV SOLN: Freq: Once | INTRAVENOUS | Status: AC

## 2021-04-21 NOTE — ED Notes (Signed)
Received verbal report from [place] at this time

## 2021-04-21 NOTE — ED Provider Notes (Addendum)
[facility] EMERGENCY DEPARTMENT Provider Note   CSN: [id number] Arrival date & time: 04/21/21  5009     History  Chief Complaint  Patient presents with   Abdominal Pain    Kyle Berry is a 51 y.o. male.  The history is provided by the patient.  Abdominal Pain Pain location:  Epigastric Pain quality: aching and stabbing   Pain radiates to:  Back Pain severity:  Severe Onset quality:  Gradual Timing:  Constant Progression:  Worsening Chronicity:  Recurrent Context comment:  Hx of pancreatic divisum and pancreatitis. Relieved by:  Nothing Worsened by:  Nothing Associated symptoms: anorexia, hematemesis, nausea and vomiting   Associated symptoms: no chest pain, no chills, no cough, no dysuria, no fever, no hematuria, no shortness of breath and no sore throat   Risk factors: no alcohol abuse and no NSAID use       Home Medications Prior to Admission medications   Medication Sig Start Date End Date Taking? Authorizing Provider  famotidine (PEPCID) 20 MG tablet Take 1 tablet (20 mg total) by mouth 2 (two) times daily. Instructions: Take Pepcid 20 mg twice daily x30 days, then take Pepcid 20 mg daily thereafter. Patient taking differently: Take 20 mg by mouth daily. Instructions: Take Pepcid 20 mg twice daily x30 days, then take Pepcid 20 mg daily thereafter. 11/03/20 05/02/21  Kayleen Memos, DO  feeding supplement (ENSURE ENLIVE / ENSURE PLUS) LIQD Take 237 mLs by mouth 2 (two) times daily between meals. 02/04/21   Terrilee Croak, MD  lipase/protease/amylase (CREON) 36000 UNITS CPEP capsule Take 2 capsules (72,000 Units total) by mouth 3 (three) times daily with meals. 12/31/20   Barb Merino, MD  Naphazoline-Pheniramine (VISINE-A OP) Place 1 drop into both eyes daily as needed (red eyes).    [provider]  promethazine (PHENERGAN) 25 MG tablet Take 1 tablet (25 mg total) by mouth every 6 (six) hours as needed for up to 5 days for nausea or  vomiting. 02/04/21 02/09/21  Terrilee Croak, MD  telmisartan (MICARDIS) 40 MG tablet Take 40 mg by mouth daily. 09/26/20   [provider]      Allergies    Diclofenac, Norvasc [amlodipine besylate], Omeprazole, and Prednisone    Review of Systems   Review of Systems  Constitutional:  Negative for chills and fever.  HENT:  Negative for ear pain and sore throat.   Eyes:  Negative for pain and visual disturbance.  Respiratory:  Negative for cough and shortness of breath.   Cardiovascular:  Negative for chest pain and palpitations.  Gastrointestinal:  Positive for anorexia, hematemesis, nausea and vomiting. Negative for abdominal pain.  Genitourinary:  Negative for dysuria and hematuria.  Musculoskeletal:  Negative for arthralgias and back pain.  Skin:  Negative for color change and rash.  Neurological:  Negative for seizures and syncope.  All other systems reviewed and are negative.  Physical Exam Updated Vital Signs  ED Triage Vitals  Enc Vitals Group     BP 04/21/21 0612 (!) 150/96     Pulse Rate 04/21/21 0612 91     Resp 04/21/21 0612 17     Temp 04/21/21 0612 98.6 F (37 C)     Temp Source 04/21/21 0612 Oral     SpO2 04/21/21 0612 100 %     Weight --      Height --      Head Circumference --      Peak Flow --  Pain Score 04/21/21 0836 10     Pain Loc --      Pain Edu? --      Excl. in [place]? --     Physical Exam Vitals and nursing note reviewed.  Constitutional:      General: He is in acute distress.     Appearance: He is well-developed.  HENT:     Head: Normocephalic and atraumatic.  Eyes:     Conjunctiva/sclera: Conjunctivae normal.  Cardiovascular:     Rate and Rhythm: Normal rate and regular rhythm.     Heart sounds: Normal heart sounds. No murmur heard. Pulmonary:     Effort: Pulmonary effort is normal. No respiratory distress.     Breath sounds: Normal breath sounds.  Abdominal:     General: There is no distension.     Palpations: Abdomen is  soft.     Tenderness: There is abdominal tenderness in the epigastric area.  Musculoskeletal:        General: No swelling.     Cervical back: Neck supple.  Skin:    General: Skin is warm and dry.     Capillary Refill: Capillary refill takes less than 2 seconds.  Neurological:     General: No focal deficit present.     Mental Status: He is alert.  Psychiatric:        Mood and Affect: Mood normal.    ED Results / Procedures / Treatments   Labs (all labs ordered are listed, but only abnormal results are displayed) Labs Reviewed  COMPREHENSIVE METABOLIC PANEL - Abnormal; Notable for the following components:      Result Value   Sodium 133 (*)    Chloride 97 (*)    Glucose, Bld 153 (*)    BUN 5 (*)    AST 52 (*)    All other components within normal limits  CBC WITH DIFFERENTIAL/PLATELET - Abnormal; Notable for the following components:   RBC 4.18 (*)    MCH 34.2 (*)    RDW 16.2 (*)    Neutro Abs 7.9 (*)    All other components within normal limits  LIPASE, BLOOD - Abnormal; Notable for the following components:   Lipase 265 (*)    All other components within normal limits  URINE CULTURE  RESP PANEL BY RT-PCR (FLU A&B, COVID) ARPGX2  URINALYSIS, ROUTINE W REFLEX MICROSCOPIC  TYPE AND SCREEN    EKG None  Radiology CT ABDOMEN PELVIS W CONTRAST  Result Date: 04/21/2021 CLINICAL DATA:  Epigastric pain.  History of pancreatitis. EXAM: CT ABDOMEN AND PELVIS WITH CONTRAST TECHNIQUE: Multidetector CT imaging of the abdomen and pelvis was performed using the standard protocol following bolus administration of intravenous contrast. CONTRAST:  157mL OMNIPAQUE IOHEXOL 300 MG/ML  SOLN COMPARISON:  02/01/2021 FINDINGS: Lower chest: Unremarkable. Hepatobiliary: No suspicious focal abnormality within the liver parenchyma. Gallbladder is surgically absent. No intrahepatic or extrahepatic biliary dilation. Pancreas: Pancreas shows peripancreatic edema/inflammation in the head and body region  with relative sparing of the tail. There is mild main duct dilatation in the body and head of pancreas. No evidence for pancreatic necrosis. Degree of peripancreatic inflammation appears slightly increased since 02/01/2021. While there is some subtle loss of pancreatic lobulation, CT imaging features are not entirely characteristic for autoimmune pancreatitis. Spleen: No splenomegaly. No focal mass lesion. Adrenals/Urinary Tract: No adrenal nodule or mass. Right kidney is markedly atrophic. Cluster of small stones in the lower pole left kidney, measuring up to 7 mm. No hydronephrosis. No  left ureteral stone or hydroureter. No bladder stone. Stomach/Bowel: Stomach is unremarkable. No gastric wall thickening. No evidence of outlet obstruction. Duodenum is normally positioned as is the ligament of Treitz. No small bowel wall thickening. No small bowel dilatation. The terminal ileum is normal. The appendix is normal. No gross colonic mass. No colonic wall thickening. Vascular/Lymphatic: No abdominal aortic aneurysm. No abdominal aortic atherosclerotic calcification. There is no gastrohepatic or hepatoduodenal ligament lymphadenopathy. No retroperitoneal or mesenteric lymphadenopathy. No pelvic sidewall lymphadenopathy. Reproductive: The prostate gland and seminal vesicles are unremarkable. Other: No intraperitoneal free fluid. Musculoskeletal: No worrisome lytic or sclerotic osseous abnormality. IMPRESSION: 1. Peripancreatic edema/inflammation in the head and body region with relative sparing of the tail. There is mild main duct dilatation in the body and head of pancreas, similar. No evidence for pancreatic necrosis. Degree of peripancreatic inflammation appears minimally increased compared to the prior study. 2. Marked atrophy of the right kidney. 3. Nonobstructing left renal stones. Electronically Signed   By: Misty Stanley M.D.   On: 04/21/2021 09:41    Procedures Procedures    Medications Ordered in  ED Medications  oxyCODONE-acetaminophen (PERCOCET/ROXICET) 5-325 MG per tablet 1 tablet (1 tablet Oral Given 04/21/21 0835)  ondansetron (ZOFRAN-ODT) disintegrating tablet 4 mg (4 mg Oral Given 04/21/21 0835)  iohexol (OMNIPAQUE) 300 MG/ML solution 100 mL (100 mLs Intravenous Contrast Given 04/21/21 0926)  HYDROmorphone (DILAUDID) injection 1 mg (1 mg Intravenous Given 04/21/21 1800)  sodium chloride 0.9 % bolus 1,000 mL (1,000 mLs Intravenous New Bag/Given 04/21/21 1800)  0.9 %  sodium chloride infusion ( Intravenous New Bag/Given 04/21/21 1800)  ondansetron (ZOFRAN) injection 4 mg (4 mg Intravenous Given 04/21/21 1800)    ED Course/ Medical Decision Making/ A&P                           Medical Decision Making  Kyle Berry is a 51 year old male with history of hypertension, high cholesterol, pancreatitis who presents the ED with abdominal pain.  His past medical history makes this chief complaint high risk.  He comes in with worsening abdominal pain over the last several days but has been ongoing for weeks.  Has had now uncontrollable nausea and vomiting the last day or 2.  May be some blood in his emesis.  Denies any alcohol or drug use.  He has a history of pancreatitis secondary to pancreatic divisum upon review of prior medical charts.  Has had frequent admissions for the same.  Possibly history of kidney stones as well.  I have a lower suspicion for ACS and overall differential diagnosis includes pancreatitis versus cholecystitis versus less likely bowel obstruction.  Could be kidney stones.  To evaluate we will get blood work, CT scan abdomen pelvis.  Labs are significant for elevated lipase at 265.  Gallbladder enzymes are within normal limits.  No significant leukocytosis.  CT scan upon my interpretation and confirmed that radiology shows pancreatitis.  Clinically this also appears to be acute on chronic pancreatitis.  No kidney stone.  No bowel obstruction.  Socially he states that he has been  out of his medications for his pancreatitis and suspect that could be a cause of his pain.  He has not been with his Creon for several weeks.  Overall given his pain his nausea and vomiting will admit to medicine for treatment of his acute on chronic pancreatitis.  Patient given a dose of IV Dilaudid, IV fluids and IV Zofran.  On reevaluation patient is feeling slightly better but will admit for further hydration and bowel rest.  We will admit the patient to the medicine service for further care.  This chart was dictated using voice recognition software.  Despite best efforts to proofread,  errors can occur which can change the documentation meaning.   I have reviewed and interpreted all lab work and imaging.     Final Clinical Impression(s) / ED Diagnoses Final diagnoses:  Acute pancreatitis, unspecified complication status, unspecified pancreatitis type    Rx / DC Orders ED Discharge Orders     None         Lennice Sites, DO 04/21/21 [place], [place], DO 04/21/21 1804

## 2021-04-21 NOTE — ED Notes (Signed)
Pharmacy and requested meds for 3rd time

## 2021-04-21 NOTE — ED Notes (Signed)
ED Provider at bedside. 

## 2021-04-21 NOTE — ED Notes (Signed)
Provider sent a message in reference to pt VS at this time

## 2021-04-21 NOTE — ED Triage Notes (Addendum)
Patient states that he is pooping, peeing, and vomiting blood. Patient states that he is worried about his kidney because he only has one. Also states he has been out of his "pancreas medication" for over a month. Hx of pancreatitis

## 2021-04-21 NOTE — H&P (Addendum)
Date: 04/21/2021               Patient Name:  Kyle Berry MRN: [id number]  DOB: 1970/12/23 Age / Sex: 51 y.o., male   PCP: [name] (Inactive)         Medical Service: Internal Medicine Teaching Service         Attending Physician: Dr. Dorian Pod     First Contact: Idamae Schuller, MD Pager: Teodora Medici [phone]  Second Contact: Hadassah Pais, MD Pager: Rudean Curt [phone]       After Hours (After 5p/  First Contact Pager: [phone]  weekends / holidays): Second Contact Pager: [phone]    Chief Complaint: abdominal pain   History of Present Illness:  Kyle Berry is a 51 y.o. 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   Mr. Speas states he has been out of Creon for 1 month now due to financial difficulty. Yesterday, he developed nausea and vomiting with increased abdominal pain. He states he has abdominal pain at baseline but this is worse. He tried to take his blood pressure medication yesterday but was unable to keep it down. He has also been unable to hold down water. Yesterday, he had some bright and dark red blood in his emesis, initially a lot but then lessened. He has vomited several more times today but no additional blood seen in emesis.   He endorses chills and 9/10 abdominal pain that radiates to his back. He endorses chest pain and SOB that is intermittent, stating that this typically occurs during acute pancreatitis flares. He denies fever, dizziness, vision changes, dysuria, constipation or diarrhea. His last bowel movement was yesterday, and he noticed that bright red blood was mixed in with stool. He has noticed some blood in his urine as well; he states this happens to him when he is dehydrated due to only having one working kidney.   Initial ED workup elevated lipase 265 and CT scan demonstrating acute on chronic pancreatitis. WBC 9.6, Hgb  14, and Cr at baseline 1.1. IMTS contacted for admission given.    Meds:  Current Meds  Medication Sig   famotidine (PEPCID) 20 MG tablet Take 1 tablet (20 mg total) by mouth 2 (two) times daily. Instructions: Take Pepcid 20 mg twice daily x30 days, then take Pepcid 20 mg daily thereafter. (Patient taking differently: Take 20 mg by mouth daily. Instructions: Take Pepcid 20 mg twice daily x30 days, then take Pepcid 20 mg daily thereafter.)   feeding supplement (ENSURE ENLIVE / ENSURE PLUS) LIQD Take 237 mLs by mouth 2 (two) times daily between meals.   lipase/protease/amylase (CREON) 36000 UNITS CPEP capsule Take 2 capsules (72,000 Units total) by mouth 3 (three) times daily with meals.   Naphazoline-Pheniramine (VISINE-A OP) Place 1 drop into both eyes daily as needed (red eyes).   promethazine (PHENERGAN) 25 MG tablet Take 25 mg by mouth every 6 (six) hours as needed for nausea or vomiting.   telmisartan (MICARDIS) 40 MG tablet Take 40 mg by mouth daily.     Allergies: Allergies as of 04/21/2021 - Review Complete 04/21/2021  Allergen Reaction Noted   Diclofenac Hives 12/14/2013   Norvasc [amlodipine besylate] Other (See Comments) 08/15/2016   Omeprazole Other (See Comments) 08/15/2016   Prednisone Other (See Comments) 12/14/2013   Past Medical History:  Diagnosis Date   Chronic bronchitis (HCC)    Chronic lower back  pain    Chronic pancreatitis ([place])    Congenital single kidney    Gastroparesis 06/2020   confirmed by emptying [facility] at [facility]   GERD (gastroesophageal reflux disease)    Headache    "once/month" (05/13/2017)   High cholesterol    Hypertension    Migraine    "a couple/year" (05/13/2017)   Nephrolithiasis 05/13/2017   Pancreatitis    Pneumonia ~ 09/2015   Presence of pancreatic duct stent    Recurrent acute pancreatitis     Family History:  Mother: HTN Father: HTN, kidney disease  Grandmother- pancreatic cancer   Social History:   Lives with his girlfriend  in [place] Employment- Engineer, building services  Tobacco- reports never having smoked  EtOH- denies current alcohol use; last drink was about 5 years ago Illicit drug use- denies use  IADLs/ADLs- can person independently at baseline   Review of Systems: A complete ROS was negative except as per HPI.    Physical Exam: Blood pressure (!) 188/105, pulse 70, temperature 97.8 F (36.6 C), temperature source Oral, resp. rate 17, height 6\' 1"  (1.854 m), weight 72.6 kg, SpO2 93 %.  Constitutional: alert, friendly, appears to be in pain and discomfort  HENT: normocephalic, atraumatic, mucous membranes moist Eyes: conjunctiva non-erythematous, EOMI Cardiovascular: RRR, no m/r/g, non-edematous bilateral LE Pulmonary/Chest: normal work of breathing on room air, LCTAB Abdominal: soft, non-distended, epigastric tenderness, and bilateral upper abdominal quadrants TTP MSK: normal bulk and tone Neurological: A&O x 3 Skin: warm and dry  EKG: SR  Assessment & Plan by Problem: Active Problems:   Acute on chronic pancreatitis (HCC)  Kyle Berry is a 51 y.o. 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    Acute on chronic pancreatitis 2/2 pancreatic divisum  Numerous ED visits/hospitalizations over the past year. Had biliary and pancreatic sphincterectomy and placement of plastic biliary stent in early 2022 which were then removed in 10/2020 because of ongoing abdominal pain. Presented today with worsening epigastric pain, nausea and vomiting; found to have AoC pancreatitis on labs and CT. Pt states that pain and nausea not responsive to morphine and Zofran. - Dilaudid 1 mg q2h prn, tylenol prn  - Phenegran  - LR 150 cc/hr for 12 hours  - NPO/ bowel rest  - CMP  Pancreatic insuffiencey  Reports being out of Creon for about 1 month due to financial difficulty. Stool ranges from bulky  to lose to watery.  -Creon once diet advanced  Severe asymptomatic HTN Elevated BP in setting of severe ongoing pain and inability to tolerate PO medication 2/2 N/V. Last took his medication 2 days ago. Asymptomatic besides ongoing pain and nausea.  - Continue home Irbesartan 150 mg  - Pain management as above   Hematemesis Hematochezia Reports small amount of blood in vomit and stool yesterday. Has hx off hematemesis during AoC pancreatitis flares, probably small mallory weiss tear. No bloody stool today.  Hgb stable at 14, and BUN not elevated. No further vomiting; low suspicion for active bleed.  - CBC - CMP  Atrophic right kidney    Previous notes mention congential single kidney, however appears that right kidney present but is markedly atrophic 2/2 renal artery stenosis. Stable renal function. - CMP   Best Practice: Diet: NPO IVF: LR 150cc/hr for 12 hours  VTE: Lovenox 40  Code: Full   Lajean Manes, MD  Internal Medicine Resident, PGY-1 [facility]uss[facility]  [facility] Internal Medicine Residency  Pager: [phone] 10:12 PM, 04/21/2021

## 2021-04-22 ENCOUNTER — Encounter (HOSPITAL_COMMUNITY): Payer: Self-pay | Admitting: Internal Medicine

## 2021-04-22 DIAGNOSIS — G43909 Migraine, unspecified, not intractable, without status migrainosus: Secondary | ICD-10-CM | POA: Diagnosis present

## 2021-04-22 DIAGNOSIS — K8500 Idiopathic acute pancreatitis without necrosis or infection: Secondary | ICD-10-CM | POA: Diagnosis not present

## 2021-04-22 DIAGNOSIS — K859 Acute pancreatitis without necrosis or infection, unspecified: Secondary | ICD-10-CM | POA: Diagnosis present

## 2021-04-22 DIAGNOSIS — Z888 Allergy status to other drugs, medicaments and biological substances status: Secondary | ICD-10-CM | POA: Diagnosis not present

## 2021-04-22 DIAGNOSIS — K3184 Gastroparesis: Secondary | ICD-10-CM | POA: Diagnosis present

## 2021-04-22 DIAGNOSIS — Z91120 Patient's intentional underdosing of medication regimen due to financial hardship: Secondary | ICD-10-CM | POA: Diagnosis not present

## 2021-04-22 DIAGNOSIS — R109 Unspecified abdominal pain: Secondary | ICD-10-CM | POA: Diagnosis present

## 2021-04-22 DIAGNOSIS — I10 Essential (primary) hypertension: Secondary | ICD-10-CM | POA: Diagnosis present

## 2021-04-22 DIAGNOSIS — K921 Melena: Secondary | ICD-10-CM | POA: Diagnosis present

## 2021-04-22 DIAGNOSIS — K219 Gastro-esophageal reflux disease without esophagitis: Secondary | ICD-10-CM | POA: Diagnosis present

## 2021-04-22 DIAGNOSIS — K920 Hematemesis: Secondary | ICD-10-CM | POA: Diagnosis present

## 2021-04-22 DIAGNOSIS — Q453 Other congenital malformations of pancreas and pancreatic duct: Secondary | ICD-10-CM | POA: Diagnosis not present

## 2021-04-22 DIAGNOSIS — Z800 Family history of malignant neoplasm of digestive organs: Secondary | ICD-10-CM | POA: Diagnosis not present

## 2021-04-22 DIAGNOSIS — K8681 Exocrine pancreatic insufficiency: Secondary | ICD-10-CM | POA: Diagnosis present

## 2021-04-22 DIAGNOSIS — Z79899 Other long term (current) drug therapy: Secondary | ICD-10-CM | POA: Diagnosis not present

## 2021-04-22 DIAGNOSIS — E7800 Pure hypercholesterolemia, unspecified: Secondary | ICD-10-CM | POA: Diagnosis present

## 2021-04-22 DIAGNOSIS — N200 Calculus of kidney: Secondary | ICD-10-CM | POA: Diagnosis present

## 2021-04-22 DIAGNOSIS — Z20822 Contact with and (suspected) exposure to covid-19: Secondary | ICD-10-CM | POA: Diagnosis present

## 2021-04-22 DIAGNOSIS — K861 Other chronic pancreatitis: Secondary | ICD-10-CM | POA: Diagnosis present

## 2021-04-22 DIAGNOSIS — Z8249 Family history of ischemic heart disease and other diseases of the circulatory system: Secondary | ICD-10-CM | POA: Diagnosis not present

## 2021-04-22 DIAGNOSIS — N261 Atrophy of kidney (terminal): Secondary | ICD-10-CM | POA: Diagnosis present

## 2021-04-22 LAB — COMPREHENSIVE METABOLIC PANEL
ALT: 24 U/L (ref 0–44)
AST: 35 U/L (ref 15–41)
Albumin: 3.6 g/dL (ref 3.5–5.0)
Alkaline Phosphatase: 60 U/L (ref 38–126)
Anion gap: 12 (ref 5–15)
BUN: <5 mg/dL — ABNORMAL LOW (ref 6–20)
CO2: 25 mmol/L (ref 22–32)
Calcium: 8.8 mg/dL — ABNORMAL LOW (ref 8.9–10.3)
Chloride: 98 mmol/L (ref 98–111)
Creatinine, Ser: 1.06 mg/dL (ref 0.61–1.24)
GFR, Estimated: >60 mL/min (ref >60)
Glucose, Bld: 147 mg/dL — ABNORMAL HIGH (ref 70–99)
Potassium: 3.5 mmol/L (ref 3.5–5.1)
Sodium: 135 mmol/L (ref 135–145)
Total Bilirubin: 1.4 mg/dL — ABNORMAL HIGH (ref 0.3–1.2)
Total Protein: 7 g/dL (ref 6.5–8.1)

## 2021-04-22 LAB — CBC
HCT: 39.2 % (ref 39.0–52.0)
Hemoglobin: 13.4 g/dL (ref 13.0–17.0)
MCH: 33.7 pg (ref 26.0–34.0)
MCHC: 34.2 g/dL (ref 30.0–36.0)
MCV: 98.5 fL (ref 80.0–100.0)
Platelets: 175 10*3/uL (ref 150–400)
RBC: 3.98 MIL/uL — ABNORMAL LOW (ref 4.22–5.81)
RDW: 16 % — ABNORMAL HIGH (ref 11.5–15.5)
WBC: 10.8 10*3/uL — ABNORMAL HIGH (ref 4.0–10.5)
nRBC: 0 % (ref 0.0–0.2)

## 2021-04-22 MED ORDER — LACTATED RINGERS IV SOLN: INTRAVENOUS | Status: AC

## 2021-04-22 NOTE — Care Management (Signed)
The Transition of Care Department [facility]) has reviewed patient and no TOC needs have been identified at this time. We will continue to monitor patient advancement through interdisciplinary progression rounds. If new patient transition needs arise, please place a TOC consult

## 2021-04-22 NOTE — Progress Notes (Signed)
° °  Subjective:   Patient evaluated at bedside this AM. Was last in the hospital in October for mild pancreatitis, but reports this feels worse. Endorses vomiting last night with no blood. Denies hematochezia or recent weight loss.  Objective:  Vital signs in last 24 hours: Vitals:   04/21/21 2330 04/22/21 0000 04/22/21 0030 04/22/21 0430  BP: (!) 176/104 (!) 182/105 (!) 185/107 (!) 166/106  Pulse: 73 70 75 90  Resp: (!) 21 15 16 18   Temp:      TempSrc:      SpO2: 97% 96% 99% 95%  Weight:      Height:       General: Resting comfortably in bed, no acute distress CV: Regular rate, rhythm. No murmurs appreciated. DP pulses 2+ bilaterally. Pulm: Normal work of breathing on room air. Clear to ausculation bilaterally. GI: Abdomen soft, non-distended. Tenderness to palpation throughout, but worse in epigastric region and right upper and lower quadrants. MSK: Normal bulk, tone. No pitting edema bilaterally. [place]: Awake, alert, conversing appropriately. No focal deficits.   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   Active Problems:   Acute on chronic pancreatitis (HCC)  #Acute on chronic pancreatitis 2/2 pancreatic divisum This morning, patient continues to feel nauseous and unable to tolerate PO. We will continue with fluid resuscitation and bowel rest. As noted, we will avoid morphine and Zofran given previous ineffectiveness. - Continue lactated ringers 100cc/hr - Dilaudid 1mg  every 2 hours as needed - Phenergan 12.mg every 6 hours as needed - NPO, bowel rest  #Pancreatic insufficiency  Unfortunately patient has been out of Creon for approximately one month due to the high cost of the medication. Will plan to re-start once he can tolerate PO. Will also consult TOC for further assistance. - Will re-start Creon once tolerating  PO - TOC consult  #Severe asymptomatic hypertension Patient's blood pressure has improved since receiving home medication. Will continue. - Irbesartan 300mg  daily  #Hematuria 2/2 nephrolithiasis Hemoglobin noted on urinalysis and on previous UA. Per chart review, patient has history of nephrolithiasis. Patient currently reports abdominal pain, although more likely due to acute pancreatitis. WE will continue to monitor for any new signs or symptoms.   Prior to Admission Living Arrangement: Home Anticipated Discharge Location: Home Barriers to Discharge: medical management Family contact: Sister, Leonia Reeves, to be notified Dispo: Anticipated discharge in approximately 1-2 day(s).   Sanjuan Dame, MD 04/22/2021, 6:49 AM Pager: [phone] After 5pm on weekdays and 1pm on weekends: On Call pager [phone]

## 2021-04-23 DIAGNOSIS — K859 Acute pancreatitis without necrosis or infection, unspecified: Principal | ICD-10-CM

## 2021-04-23 DIAGNOSIS — K861 Other chronic pancreatitis: Secondary | ICD-10-CM

## 2021-04-23 LAB — BASIC METABOLIC PANEL
Anion gap: 11 (ref 5–15)
BUN: 7 mg/dL (ref 6–20)
CO2: 26 mmol/L (ref 22–32)
Calcium: 8.5 mg/dL — ABNORMAL LOW (ref 8.9–10.3)
Chloride: 96 mmol/L — ABNORMAL LOW (ref 98–111)
Creatinine, Ser: 1.21 mg/dL (ref 0.61–1.24)
GFR, Estimated: >60 mL/min (ref >60)
Glucose, Bld: 79 mg/dL (ref 70–99)
Potassium: 3.3 mmol/L — ABNORMAL LOW (ref 3.5–5.1)
Sodium: 133 mmol/L — ABNORMAL LOW (ref 135–145)

## 2021-04-23 LAB — URINE CULTURE: Culture: NO GROWTH

## 2021-04-23 MED ORDER — FAMOTIDINE 20 MG PO TABS
20.0000 mg | ORAL_TABLET | Freq: Every day | ORAL | Status: DC
  Administered 2021-04-23 – 2021-04-26 (×4): 20 mg via ORAL
  Filled 2021-04-23 (×4): qty 1

## 2021-04-23 MED ORDER — LACTATED RINGERS IV SOLN: INTRAVENOUS | Status: AC

## 2021-04-23 MED ORDER — POTASSIUM CHLORIDE 10 MEQ/100ML IV SOLN
10.0000 meq | INTRAVENOUS | Status: AC
  Administered 2021-04-23 (×2): 10 meq via INTRAVENOUS
  Filled 2021-04-23 (×2): qty 100

## 2021-04-23 MED ORDER — PANCRELIPASE (LIP-PROT-AMYL) 36000-114000 UNITS PO CPEP
72000.0000 [IU] | ORAL_CAPSULE | Freq: Three times a day (TID) | ORAL | Status: DC
  Administered 2021-04-24 – 2021-04-26 (×8): 72000 [IU] via ORAL
  Filled 2021-04-23 (×8): qty 2

## 2021-04-23 NOTE — Progress Notes (Addendum)
HD#1 SUBJECTIVE:  Patient Summary: Kyle Berry is a 51 y.o. 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   Overnight Events: None  Interim History: Patient reports feeling significantly better this morning.  His pain is under better control and he feels ready to try to eat.  OBJECTIVE:  Vital Signs: Vitals:   04/22/21 1520 04/22/21 2032 04/23/21 0412 04/23/21 0800  BP: (!) 151/109 (!) 145/100 (!) 146/94 (!) 143/81  Pulse: 98 82 76 75  Resp: 18 17 18 19   Temp: 98.9 F (37.2 C) 98.2 F (36.8 C) 98.8 F (37.1 C) 98.5 F (36.9 C)  TempSrc: Oral Oral Oral Oral  SpO2: 100% 100% 97% 99%  Weight:      Height:       Supplemental O2: Room Air SpO2: 99 %  Filed Weights   04/21/21 1939  Weight: 72.6 kg     Intake/Output Summary (Last 24 hours) at 04/23/2021 1123 Last data filed at 04/23/2021 8315 Gross per 24 hour  Intake 3443.49 ml  Output --  Net 3443.49 ml   Net IO Since Admission: 4,225.24 mL [04/23/21 1123]  Physical Exam: Constitutional: Patient resting comfortably in bed, no acute distress noted. Cardio: Regular rate and rhythm.  No murmurs, rubs, gallops. Pulm: Clear to auscultation bilaterally.  Normal work of breathing on room air. Abdomen: Soft, nondistended, mild tenderness to palpation to epigastric area. MSK: Negative for extremity edema. Skin: Skin is warm and dry. Neuro: Alert and oriented x3.  No focal deficits noted. Psych: Normal mood and affect.  Patient Lines/Drains/Airways Status     Active Line/Drains/Airways     Name Placement date Placement time Site Days   Peripheral IV 04/21/21 22 G Anterior;Proximal;Right Forearm 04/21/21  0925  Forearm  2             ASSESSMENT/PLAN:  Assessment: Principal Problem:   Acute pancreatitis Active Problems:   Acute on chronic  pancreatitis (HCC)  Kyle Berry is a 51 y.o. 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   Plan: #Acute on chronic pancreatitis #History of pancreatic divisum Patient reports feeling significantly better this morning from a pain standpoint and reports that he feels ready to attempt eating. -Will progress diet to clear liquids as tolerated -Continue LR 100 cc/hour.  Once patient is able to tolerate p.o. intake these can be discontinued. -Dilaudid 1 mg every 2 hours as needed for pain -Phenergan 12 mg every 6 hours as needed for nausea  #Pancreatic insufficiency Patient has reportedly been out of Creon for approximately 1 month due to financial burden. -Restart Creon as patient is able to tolerate p.o.  #Severe (resolved) asymptomatic hypertension due to inability to tolerate oral medication prior to admission Blood pressure significantly improved since initial presentation, most recent measurement of 143/81. -Continue irbesartan 300 mg daily  #Hematuria 2/2 nephrolithiasis Incidental finding on urinalysis, and has been noted on previous urinalysis.  At this time it is felt that the patient's abdominal pain is attributed to acute on chronic pancreatitis episode. -Continue to monitor  Best Practice: Diet: Clear liquid diet IVF: Fluids: LR, Rate:  150/hr VTE: enoxaparin (LOVENOX) injection 40 mg Start: 04/22/21 1000 Code: Full AB: None DISPO: Anticipated discharge  1-2 days  to Home  pending Medical stability.  Signature: Farrel Gordon, D.O.  Internal Medicine Resident, PGY-1 [facility]  Pager: [phone] 11:23 AM, 04/23/2021   Please contact the on call pager after 5 pm and on weekends at [phone].

## 2021-04-24 DIAGNOSIS — K8500 Idiopathic acute pancreatitis without necrosis or infection: Secondary | ICD-10-CM | POA: Diagnosis not present

## 2021-04-24 LAB — BASIC METABOLIC PANEL
Anion gap: 14 (ref 5–15)
BUN: 5 mg/dL — ABNORMAL LOW (ref 6–20)
CO2: 27 mmol/L (ref 22–32)
Calcium: 9.2 mg/dL (ref 8.9–10.3)
Chloride: 95 mmol/L — ABNORMAL LOW (ref 98–111)
Creatinine, Ser: 1.13 mg/dL (ref 0.61–1.24)
GFR, Estimated: >60 mL/min (ref >60)
Glucose, Bld: 114 mg/dL — ABNORMAL HIGH (ref 70–99)
Potassium: 3.2 mmol/L — ABNORMAL LOW (ref 3.5–5.1)
Sodium: 136 mmol/L (ref 135–145)

## 2021-04-24 LAB — HEMOGLOBIN A1C
Hgb A1c MFr Bld: 5.4 % (ref 4.8–5.6)
Mean Plasma Glucose: 108.28 mg/dL

## 2021-04-24 MED ORDER — POTASSIUM CHLORIDE 10 MEQ/100ML IV SOLN
10.0000 meq | INTRAVENOUS | Status: AC
  Administered 2021-04-24 (×4): 10 meq via INTRAVENOUS
  Filled 2021-04-24 (×4): qty 100

## 2021-04-24 NOTE — Progress Notes (Addendum)
HD#2 SUBJECTIVE:  Patient Summary: Kyle Berry is a 51 y.o. 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   Overnight Events: None  Interim History: Patient endorses persistent pain with attempts at p.o. intake.  We did attempt to progress his diet to regular however he had abdominal pain and bloating immediately after attempting to eat.  OBJECTIVE:  Vital Signs: Vitals:   04/23/21 1636 04/23/21 2007 04/24/21 0338 04/24/21 0731  BP: (!) 137/94 (!) 140/93 140/83 (!) 160/96  Pulse: 76 79 70 74  Resp: 19 17 16 16   Temp: 98.8 F (37.1 C) 98.4 F (36.9 C) 97.6 F (36.4 C) 98.3 F (36.8 C)  TempSrc: Oral Oral Oral Oral  SpO2: 99% 99% 96% 96%  Weight:      Height:       Supplemental O2: Room Air SpO2: 96 %  Filed Weights   04/21/21 1939  Weight: 72.6 kg     Intake/Output Summary (Last 24 hours) at 04/24/2021 1340 Last data filed at 04/24/2021 0854 Gross per 24 hour  Intake 3682.5 ml  Output 300 ml  Net 3382.5 ml   Net IO Since Admission: 7,607.74 mL [04/24/21 1340]  Physical Exam: Constitutional: Patient resting comfortably in bed, no acute distress noted. Cardio: Regular rate and rhythm.  No murmurs, rubs, gallops. Pulm: Normal work of breathing on room air.  Clear to auscultation bilaterally. Abdomen: Soft, nondistended, mild tenderness to palpation of epigastrium. MSK: Negative for extremity edema. Skin: Skin is warm and dry. Neuro: Alert and oriented x3.  No focal deficits noted. Psych: Normal mood and affect.  Patient Lines/Drains/Airways Status     Active Line/Drains/Airways     Name Placement date Placement time Site Days   Peripheral IV 04/23/21 22 G Anterior;Left Wrist 04/23/21  2155  Wrist  1             ASSESSMENT/PLAN:  Assessment:  Kyle Berry is a 51 y.o. 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 intake.  An attempt was made to p.o. diet to regular which was unsuccessful secondary to abdominal pain and distention.   -Progress diet as tolerated -Dilaudid 1 mg as needed for pain -Phenergan 12 mg every 6 hours as needed for nausea   #Pancreatic insufficiency Patient has been out of Creon for approximately 1 month due to financial burden prior to this admission.  Unfortunately it is very likely his exocrine insufficiency will progress to endocrine insufficiency as well.  At this time he does not have a diagnosis of diabetes mellitus. -Creon 72,000 units 3 times daily with meals restarted today -Will check HbA1c today   #Severe asymptomatic hypertension 2/2 inability to tolerate oral medication prior to admission, resolved Blood pressure significantly improved since initial presentation, most recent measurement of 140/83. -Continue irbesartan 300 mg daily   #Hematuria 2/2 nephrolithiasis Incidental finding on urinalysis, and has been noted on previous urinalysis.  At this time it is felt that the patient's abdominal pain is attributed to acute on chronic pancreatitis episode. -Continue to monitor   Best Practice: Diet: Clear liquid diet IVF: Fluids: LR, Rate:  150/hr VTE: enoxaparin (LOVENOX) injection 40 mg Start: 04/22/21 1000 Code: Full AB: None DISPO: Anticipated discharge  1-2 days  to Home pending Medical stability.  Signature: Farrel Gordon, D.O.  Internal Medicine Resident, PGY-1 [facility]  Pager: [phone] 1:40 PM, 04/24/2021   Please contact the on call pager after 5 pm and  on weekends at [phone].

## 2021-04-25 ENCOUNTER — Other Ambulatory Visit (HOSPITAL_COMMUNITY): Payer: Self-pay

## 2021-04-25 DIAGNOSIS — K8500 Idiopathic acute pancreatitis without necrosis or infection: Secondary | ICD-10-CM | POA: Diagnosis not present

## 2021-04-25 LAB — BASIC METABOLIC PANEL
Anion gap: 9 (ref 5–15)
BUN: 7 mg/dL (ref 6–20)
CO2: 26 mmol/L (ref 22–32)
Calcium: 9.1 mg/dL (ref 8.9–10.3)
Chloride: 100 mmol/L (ref 98–111)
Creatinine, Ser: 1.25 mg/dL — ABNORMAL HIGH (ref 0.61–1.24)
GFR, Estimated: >60 mL/min (ref >60)
Glucose, Bld: 114 mg/dL — ABNORMAL HIGH (ref 70–99)
Potassium: 3.9 mmol/L (ref 3.5–5.1)
Sodium: 135 mmol/L (ref 135–145)

## 2021-04-25 LAB — CBC
HCT: 34.7 % — ABNORMAL LOW (ref 39.0–52.0)
Hemoglobin: 11.6 g/dL — ABNORMAL LOW (ref 13.0–17.0)
MCH: 33.7 pg (ref 26.0–34.0)
MCHC: 33.4 g/dL (ref 30.0–36.0)
MCV: 100.9 fL — ABNORMAL HIGH (ref 80.0–100.0)
Platelets: 170 10*3/uL (ref 150–400)
RBC: 3.44 MIL/uL — ABNORMAL LOW (ref 4.22–5.81)
RDW: 15.9 % — ABNORMAL HIGH (ref 11.5–15.5)
WBC: 5.6 10*3/uL (ref 4.0–10.5)
nRBC: 0 % (ref 0.0–0.2)

## 2021-04-25 MED ORDER — LACTATED RINGERS IV SOLN: INTRAVENOUS | Status: DC

## 2021-04-25 MED ORDER — HYDROMORPHONE HCL 1 MG/ML IJ SOLN
1.0000 mg | INTRAMUSCULAR | Status: DC | PRN
  Administered 2021-04-25 – 2021-04-26 (×7): 1 mg via INTRAVENOUS
  Filled 2021-04-25 (×8): qty 1

## 2021-04-25 NOTE — Progress Notes (Signed)
HD#3 SUBJECTIVE:  Patient Summary: Kyle Berry is a 51 y.o. 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   Overnight Events: None   Interim History: Patient endorses continued pain associated with attempting to eat a regular diet.  He was unable to tolerate the majority of lunch and dinner 01/09 and required several hours to finish his breakfast this morning.  He states the pain waxes and wanes and remains quite severe when he experiences it.  OBJECTIVE:  Vital Signs: Vitals:   04/24/21 2013 04/25/21 0018 04/25/21 0520 04/25/21 0733  BP: (!) 148/104 (!) 146/90 (!) 149/90 (!) 149/100  Pulse: 88 69 74 78  Resp:    18  Temp: 98.3 F (36.8 C)  98.4 F (36.9 C) 98.4 F (36.9 C)  TempSrc: Oral  Oral Oral  SpO2: 98%  95% 97%  Weight:      Height:       Supplemental O2: Room Air SpO2: 97 %  Filed Weights   04/21/21 1939  Weight: 72.6 kg     Intake/Output Summary (Last 24 hours) at 04/25/2021 1122 Last data filed at 04/25/2021 0930 Gross per 24 hour  Intake 510 ml  Output --  Net 510 ml   Net IO Since Admission: 8,117.74 mL [04/25/21 1122]  Physical Exam: Constitutional: Patient is sitting up on the side of the bed, no acute distress noted. Cardio: Regular rate and rhythm.  No murmurs, rubs, gallops. Pulm: Clear to auscultation bilaterally.  Work of breathing on room air. Abdomen: Soft, nondistended, with mild tenderness to palpation over epigastric and suprapubic areas with radiation to the lower back. MSK: Negative for extremity edema. Skin: Skin is warm and dry. Neuro: Alert and oriented x3.  No focal deficits noted. Psych: Normal mood and affect.  Patient Lines/Drains/Airways Status     Active Line/Drains/Airways     Name Placement date Placement time Site Days   Peripheral IV 04/23/21 22  G Anterior;Left Wrist 04/23/21  2155  Wrist  2             ASSESSMENT/PLAN:  Assessment: Principal Problem:   Acute pancreatitis Active Problems:   Acute on chronic pancreatitis (HCC)  Kyle Berry is a 51 y.o. 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 intake, particularly with progression of diet 01/09.  He makes attempts to eat his meals and can only tolerate small amounts.  It also takes him an extended period of time to eat due to the pain.  He does water states that the pain he is experiencing is not more severe with regular versus clear liquid diet. -Progress diet as tolerated -Restart LR 125 cc/hour given poor tolerance of p.o. intake -Dilaudid 1 mg as needed for pain -Phenergan 12 mg every 6 hours as needed for nausea   #Pancreatic insufficiency Patient has been out of Creon for approximately 1 month due to financial burden prior to this admission.  Unfortunately it is very likely his exocrine insufficiency will progress to endocrine insufficiency as well over time.  HbA1c of 5.4% this admission. -Creon 72,000 units 3 times daily with meals   #  Severe asymptomatic hypertension 2/2 inability to tolerate oral medication prior to admission, resolved Blood pressure has been stable with mild elevation, most recent recording of 149/100. -Continue irbesartan 300 mg daily   #Hematuria 2/2 nephrolithiasis Incidental finding on urinalysis, and has been noted on previous urinalysis.  At this time it is felt that the patient's abdominal pain is attributed to acute on chronic pancreatitis episode. -Continue to monitor   Best Practice: Diet: Soft  diet IVF: Fluids: LR, Rate:  125/hr VTE: enoxaparin (LOVENOX) injection 40 mg Start: 04/22/21 1000 Code: Full AB: None DISPO: Anticipated discharge  1-2 days  to Home pending Medical stability.   Signature: Farrel Gordon, D.O.  Internal Medicine Resident, PGY-1 [facility]  Pager: [phone] 11:22 AM, 04/25/2021   Please contact the on call pager after 5 pm and on weekends at [phone].

## 2021-04-25 NOTE — Progress Notes (Signed)
Pt BP at 2044 was 171/100, pt c/o pain of 9/10 in his abdomen, pain med given. Rechecked pt BP and it was 182/103. On call provider notified and awaiting response.

## 2021-04-25 NOTE — Progress Notes (Signed)
Mobility Specialist Progress Note:   04/25/21 1245  Mobility  Activity Ambulated in hall  Level of Assistance Independent  Assistive Device None  Distance Ambulated (ft) 570 ft  Mobility Ambulated independently in hallway  Mobility Response Tolerated well  Mobility performed by Mobility specialist  Bed Position Chair  $Mobility charge 1 Mobility   Pt c/o 9/10 pain this afternoon d/t eating. Pt with no increase in pain during session. Back in chair with all needs met.   Nelta Numbers Mobility Specialist  Phone [phone]

## 2021-04-26 ENCOUNTER — Other Ambulatory Visit (HOSPITAL_COMMUNITY): Payer: Self-pay

## 2021-04-26 DIAGNOSIS — K8500 Idiopathic acute pancreatitis without necrosis or infection: Secondary | ICD-10-CM | POA: Diagnosis not present

## 2021-04-26 LAB — BASIC METABOLIC PANEL
Anion gap: 11 (ref 5–15)
BUN: 5 mg/dL — ABNORMAL LOW (ref 6–20)
CO2: 25 mmol/L (ref 22–32)
Calcium: 9.1 mg/dL (ref 8.9–10.3)
Chloride: 102 mmol/L (ref 98–111)
Creatinine, Ser: 1.13 mg/dL (ref 0.61–1.24)
GFR, Estimated: >60 mL/min (ref >60)
Glucose, Bld: 125 mg/dL — ABNORMAL HIGH (ref 70–99)
Potassium: 3.6 mmol/L (ref 3.5–5.1)
Sodium: 138 mmol/L (ref 135–145)

## 2021-04-26 MED ORDER — PROMETHAZINE HCL 25 MG PO TABS
25.0000 mg | ORAL_TABLET | Freq: Four times a day (QID) | ORAL | 0 refills | Status: DC | PRN
  Filled 2021-04-26: qty 30, 8d supply, fill #0

## 2021-04-26 MED ORDER — HYDROMORPHONE HCL 1 MG/ML IJ SOLN
0.5000 mg | Freq: Once | INTRAMUSCULAR | Status: AC
  Administered 2021-04-26: 0.5 mg via INTRAVENOUS
  Filled 2021-04-26: qty 0.5

## 2021-04-26 MED ORDER — TELMISARTAN 40 MG PO TABS
40.0000 mg | ORAL_TABLET | Freq: Every day | ORAL | 0 refills | Status: DC
  Filled 2021-04-26 – 2021-10-12 (×3): qty 25, 25d supply, fill #0
  Filled 2021-11-15: qty 5, 5d supply, fill #0
  Filled 2021-11-30: qty 25, 25d supply, fill #0

## 2021-04-26 MED ORDER — ACETAMINOPHEN 325 MG PO TABS: 650.0000 mg | ORAL_TABLET | Freq: Four times a day (QID) | ORAL | Status: DC | PRN

## 2021-04-26 MED ORDER — OXYCODONE-ACETAMINOPHEN 7.5-325 MG PO TABS
1.0000 | ORAL_TABLET | Freq: Four times a day (QID) | ORAL | 0 refills | Status: AC | PRN
  Filled 2021-04-26: qty 3, 1d supply, fill #0

## 2021-04-26 NOTE — Discharge Instructions (Addendum)
It was a pleasure taking care of you!  You came to the ED with abdominal pain and were found to have inflammation of the pancreas called pancreatitis. You have hx of pancreatic divisum which makes it more likely to get pancreatitis. You have pancreatic insufficiency which but have been unable to get Creon due to affordability. Your condition improved with and you were better able to tolerate food this morning.   We will discharge you a medicine for nausea and a short term pain medicine. Your pain should improve as your pancreas heals. I encourage you to drink plenty of water. I would like for you to follow up with your primary care provider.

## 2021-04-26 NOTE — Plan of Care (Signed)
°  Problem: Nutrition: Goal: Adequate nutrition will be maintained Outcome: Progressing   Problem: Pain Managment: Goal: General experience of comfort will improve Outcome: Not Progressing   

## 2021-04-26 NOTE — Discharge Summary (Signed)
Name: Kyle Berry MRN: [id number] DOB: 01-Apr-1971 51 y.o. PCP: [name] (Inactive)  Date of Admission: 04/21/2021  6:09 AM Date of Discharge: 04/26/21 Attending Physician: Axel Filler, *  Discharge Diagnosis: Principal Problem:   Acute pancreatitis Active Problems:   Acute on chronic pancreatitis [facility]) Pancreatic Divisum Pancreatic Exocrine Insufficiency  Severe asymptomatic HTN Atrophic Right Kidney  Discharge Medications: Allergies as of 04/26/2021       Reactions   Diclofenac Hives   Norvasc [amlodipine Besylate] Other (See Comments)   Fluid buildup in chest   Omeprazole Other (See Comments)   MD stopped due to pancreatitis   Prednisone Other (See Comments)   Mood swings        Medication List     TAKE these medications    acetaminophen 325 MG tablet Commonly known as: TYLENOL Take 2 tablets (650 mg total) by mouth every 6 (six) hours as needed for mild pain (or Fever >/= 101).   famotidine 20 MG tablet Commonly known as: PEPCID Take 1 tablet (20 mg total) by mouth 2 (two) times daily. Instructions: Take Pepcid 20 mg twice daily x30 days, then take Pepcid 20 mg daily thereafter. What changed: when to take this   feeding supplement Liqd Take 237 mLs by mouth 2 (two) times daily between meals.   lipase/protease/amylase 36000 UNITS Cpep capsule Commonly known as: CREON Take 2 capsules (72,000 Units total) by mouth 3 (three) times daily with meals.   oxyCODONE-acetaminophen 7.5-325 MG tablet Commonly known as: Percocet Take 1 tablet by mouth every 6 (six) hours as needed for up to 3 days for severe pain or moderate pain.   promethazine 25 MG tablet Commonly known as: PHENERGAN Take 1 tablet (25 mg total) by mouth every 6 (six) hours as needed for nausea or vomiting. What changed: Another medication with the same name was removed. Continue taking this medication, and follow the directions you see here.   telmisartan 40 MG  tablet Commonly known as: MICARDIS Take 1 tablet (40 mg total) by mouth daily.   VISINE-A OP Place 1 drop into both eyes daily as needed (red eyes).        Disposition and follow-up:   Mr.Matthieu L Dacy was discharged from [facility] in Stable condition.  At the hospital follow up visit please address:  Acute on Chronic Pancreatitis: Ensure complete resolution. Patient had residual pain and nausea but was able to tolerate PO.   Pancreatic Exocrine Insufficiency: Patient had difficulty obtaining Creon due to his copay. Efforts were made to provide assistance while patient was in the hospital. Please check if patient is able to afford the medication and offer any available assistance.   Severe asymptomatic HTN: Monitor for resolution. Patient presented with severe asymptomatic HTN as he was unable to tolerated PO meds and did not take his bp meds for a few days before coming in.    2.  Labs / imaging needed at time of follow-up: CBC, BMP  3.  Pending labs/ test needing follow-up: None  Follow-up Appointments:  Follow-up Information     [name], [place]. Call today.   Why: Call and make a hospital follow up appointment. Contact information: [place] [facility] [place] [address] [phone]         Skeet Latch, MD .   Specialty: Cardiology Contact information: [address] [place] [address] [phone]  Hospital Course by problem list: HPI Per Dr. Posey Pronto "History of Present Illness:  Kyle Berry is a 51 y.o. 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    Mr. Kloepfer states he has been out of Creon for 1 month now due to financial difficulty. Yesterday, he developed nausea and vomiting with increased abdominal pain. He states he has  abdominal pain at baseline but this is worse. He tried to take his blood pressure medication yesterday but was unable to keep it down. He has also been unable to hold down water. Yesterday, he had some bright and dark red blood in his emesis, initially a lot but then lessened. He has vomited several more times today but no additional blood seen in emesis.    He endorses chills and 9/10 abdominal pain that radiates to his back. He endorses chest pain and SOB that is intermittent, stating that this typically occurs during acute pancreatitis flares. He denies fever, dizziness, vision changes, dysuria, constipation or diarrhea. His last bowel movement was yesterday, and he noticed that bright red blood was mixed in with stool. He has noticed some blood in his urine as well; he states this happens to him when he is dehydrated due to only having one working kidney.    Initial ED workup elevated lipase 265 and CT scan demonstrating acute on chronic pancreatitis. WBC 9.6, Hgb 14, and Cr at baseline 1.1. IMTS contacted for admission given."    Acute on Chronic Pancreatitis: Patient presented with abdominal pain and was found to have acute pancreatitis. He was started on IV fluids, pain and nausea medications. His symptoms started to improve and he began tolerating PO intake. He was discharged once he was able to tolerate PO with phenergan and short course of percocet.   Pancreatic Exocrine Insufficiency: Patient had difficulty obtaining Creon due to his copay. Pharmacy has initiated paperwork for patient to provide him with assistance. He was started on Creon while inpatient and his biggest concern during the hospitalization was how he was going to get this medication once he left the hospital. A1c was checked and was 5.4.  Severe asymptomatic HTN:   BP elevated at >180/100 initially due to inability to tolerated PO. Home med was telmisartan 40 mg. Patient was started on irbesartan 150 mg qd. His BP came down and  BP at discharge was 130s/80s. His home telmisartan was restarted at discharge.   GERD:  Chronic for the patient. He was continued on pepcid 20 mg daily.   Discharge Subjective: Stated he was doing well overall.  Discharge Exam:   BP 135/86 (BP Location: Right Arm)    Pulse 83    Temp 98.7 F (37.1 C) (Oral)    Resp 16    Ht 6\' 1"  (1.854 m)    Wt 72.6 kg    SpO2 99%    BMI 21.11 kg/m  Physical Exam: Physical Exam General: Well-developed, NAD Head: Normocephalic without scalp lesions.  Eyes: PERRLA, Conjunctivae pink, sclerae white, without icterus.  Neck: Neck supple with full range of motion (ROM). Lungs: CTAB, no wheeze, rhonchi or rales.  Cardiovascular: Normal heart sounds, no r/m/g, 2+ radial pulses. Abdomen: TTP in epigastric region, normal bowel sounds MSK: No asymmetry or muscle atrophy.  Neuro: Alert and oriented. CN grossly intact Psych: Normal mood and normal affect   Pertinent Labs, Studies, and Procedures:  CBC Latest Ref Rng &  Units 04/25/2021 04/22/2021 04/21/2021  WBC 4.0 - 10.5 K/uL 5.6 10.8(H) 9.6  Hemoglobin 13.0 - 17.0 g/dL 11.6(L) 13.4 14.3  Hematocrit 39.0 - 52.0 % 34.7(L) 39.2 41.2  Platelets 150 - 400 K/uL 170 175 187    CMP Latest Ref Rng & Units 04/26/2021 04/25/2021 04/24/2021  Glucose 70 - 99 mg/dL 125(H) 114(H) 114(H)  BUN 6 - 20 mg/dL 5(L) 7 5(L)  Creatinine 0.61 - 1.24 mg/dL 1.13 1.25(H) 1.13  Sodium 135 - 145 mmol/L 138 135 136  Potassium 3.5 - 5.1 mmol/L 3.6 3.9 3.2(L)  Chloride 98 - 111 mmol/L 102 100 95(L)  CO2 22 - 32 mmol/L 25 26 27   Calcium 8.9 - 10.3 mg/dL 9.1 9.1 9.2  Total Protein 6.5 - 8.1 g/dL - - -  Total Bilirubin 0.3 - 1.2 mg/dL - - -  Alkaline Phos 38 - 126 U/L - - -  AST 15 - 41 U/L - - -  ALT 0 - 44 U/L - - -    A1c 5.4 Lipase 265  CT ABDOMEN PELVIS W CONTRAST Result Date: 04/21/2021 CLINICAL DATA:  Epigastric pain.  History of pancreatitis. EXAM: CT ABDOMEN AND PELVIS WITH CONTRAST TECHNIQUE:  IMPRESSION: 1. Peripancreatic  edema/inflammation in the head and body region with relative sparing of the tail. There is mild main duct dilatation in the body and head of pancreas, similar. No evidence for pancreatic necrosis. Degree of peripancreatic inflammation appears minimally increased compared to the prior study. 2. Marked atrophy of the right kidney. 3. Nonobstructing left renal stones. Electronically Signed   By: Misty Stanley M.D.   On: 04/21/2021 09:41     Discharge Instructions: Discharge Instructions     Call MD for:  difficulty breathing, headache or visual disturbances   Complete by: As directed    Call MD for:  extreme fatigue   Complete by: As directed    Call MD for:  hives   Complete by: As directed    Call MD for:  persistant dizziness or light-headedness   Complete by: As directed    Call MD for:  persistant nausea and vomiting   Complete by: As directed    Call MD for:  redness, tenderness, or signs of infection (pain, swelling, redness, odor or green/yellow discharge around incision site)   Complete by: As directed    Call MD for:  severe uncontrolled pain   Complete by: As directed    Call MD for:  temperature >100.4   Complete by: As directed    Diet - low sodium heart healthy   Complete by: As directed    Increase activity slowly   Complete by: As directed        Signed: Idamae Schuller, MD Tillie Rung. [facility] Internal Medicine Residency, PGY-1  04/26/2021, 4:16 PM   Pager: [phone]

## 2021-04-26 NOTE — Care Management (Signed)
Benefit check for Creon:  Pancreaze is covered under his insurance instead of Creon.  His copay is $2,442.08 due to a $4,607.92 deductible remaining  The only patient assistance there is for Pancreaze is $100 off a month

## 2021-04-26 NOTE — Progress Notes (Signed)
Mobility Specialist Progress Note:   04/26/21 1335  Mobility  Activity Ambulated in hall  Level of Assistance Independent  Assistive Device None  Distance Ambulated (ft) 570 ft  Mobility Ambulated independently in hallway  Mobility Response Tolerated well  Mobility performed by Mobility specialist  $Mobility charge 1 Mobility   Pt with 7/10 pain today, improved from 9/10 yesterday. No overt increase in pain with ambulation. Pt back in bed with all needs met.   Nelta Numbers Mobility Specialist  Phone [phone]

## 2021-04-27 ENCOUNTER — Other Ambulatory Visit (HOSPITAL_COMMUNITY): Payer: Self-pay

## 2021-04-27 NOTE — TOC Benefit Eligibility Note (Signed)
Patient Advocate Encounter  Completed and sent [facility] Assist Patient Assistance application for Creon for this patient.      [name], [place] Patient Advocate Specialist [place] Patient Advocate Team Direct Number: [phone]  Fax: [phone]

## 2021-05-22 ENCOUNTER — Ambulatory Visit (INDEPENDENT_AMBULATORY_CARE_PROVIDER_SITE_OTHER): Payer: Managed Care, Other (non HMO) | Admitting: Cardiovascular Disease

## 2021-05-22 ENCOUNTER — Encounter (HOSPITAL_BASED_OUTPATIENT_CLINIC_OR_DEPARTMENT_OTHER): Payer: Self-pay | Admitting: Cardiovascular Disease

## 2021-05-22 ENCOUNTER — Other Ambulatory Visit: Payer: Self-pay

## 2021-05-22 DIAGNOSIS — R0602 Shortness of breath: Secondary | ICD-10-CM | POA: Diagnosis not present

## 2021-05-22 DIAGNOSIS — I10 Essential (primary) hypertension: Secondary | ICD-10-CM | POA: Diagnosis not present

## 2021-05-22 DIAGNOSIS — R079 Chest pain, unspecified: Secondary | ICD-10-CM

## 2021-05-22 HISTORY — DX: Shortness of breath: R06.02

## 2021-05-22 NOTE — Progress Notes (Signed)
Cardiology Office Note   Date:  05/22/2021   ID:  Kyle Berry, DOB October 21, 1970, MRN [id number]  PCP:  Cline Crock (Inactive)  Cardiologist:   Chilton Si, MD   No chief complaint on file.     History of Present Illness: Kyle Berry is a 51 y.o. male with hypertension, hyperlipidemia, congenital solitary kidney, CKD 2, and recurrent pancreatitis here for follow-up.  He first saw Azalee Course, [place] on 11/2020.  He was admitted to the hospital 10/2020 with idiopathic pancreatitis.  He previously had a pancreatic stent placed at [facility]. He was seen by GI service and underwent endoscopy and ERCP by Dr. Meridee Score.  The previous biliary sphincterotomy and pancreatic sphincterotomy appeared to be open.  Pancreatic stones were found and removed through the minor duct.  He presented to the hospital 11/2020 with chest pain and shortness of breath.  Symptoms seem different from his typical pancreatitis pain.  There is initially concern for anterior STEMI.  Cardiac enzymes were negative.  Chest CTA was negative for PE or dissection and did show pancreatitis.  He was also incidentally noted to have COVID-19.  He had no coronary calcification on his chest CT and it was thought to be low likelihood that this was cardiac.  He had an echo at that time that revealed LVEF 57% with mild LVH and trivial MR.  When he followed up with how in the office he continued to have intermittent chest discomfort.  Given his baseline elevated heart rates it was thought that he was not a good candidate for a coronary CTA.  He recommended continuing management for his pancreatitis and following up with cardiology later.  He gets short of breath when he is laying down at night.  He feels as though he cannot get a deep breath.  He also has exertional dyspnea.  This about sometimes occurs during the daytime and is not associated with chest pain or pressure.  He has not been getting as much exercise lately.  He notes  that he does have a history of GERD but it has been well controlled with famotidine.  He follows up with gastroenterology for this.  At home his blood pressure has been stable under 130/80.  He has not had any lower extremity edema, orthopnea, or PND.  He notes that he snores lightly but has no daytime somnolence and feels well rested when he wakes up.  The shortness of breath at night does not awaken him from sleep.  He feels this before he even goes to sleep.  Past Medical History:  Diagnosis Date   Atrophy of right kidney    Chronic bronchitis (HCC)    Chronic lower back pain    Chronic pancreatitis (HCC)    Gastroparesis 06/2020   confirmed by emptying study at [facility]   GERD (gastroesophageal reflux disease)    Headache    "once/month" (05/13/2017)   High cholesterol    Hypertension    Migraine    "a couple/year" (05/13/2017)   Nephrolithiasis 05/13/2017   Pancreatitis    Pneumonia ~ 09/2015   Presence of pancreatic duct stent    Recurrent acute pancreatitis    Shortness of breath 05/22/2021    Past Surgical History:  Procedure Laterality Date   BIOPSY  11/02/2020   Procedure: BIOPSY;  Surgeon: [name]., MD;  Location: [facility] ENDOSCOPY;  Service: Gastroenterology;;   CHOLECYSTECTOMY N/A 10/23/2017   Procedure: LAPAROSCOPIC CHOLECYSTECTOMY WITH INTRAOPERATIVE CHOLANGIOGRAM;  Surgeon: Luretha Murphy, MD;  Location: [facility] ORS;  Service: General;  Laterality: N/A;   COLONOSCOPY WITH PROPOFOL N/A 11/02/2019   Procedure: COLONOSCOPY WITH PROPOFOL;  Surgeon: Hilarie Fredrickson, MD;  Location: [facility] ENDOSCOPY;  Service: Endoscopy;  Laterality: N/A;   ERCP N/A 11/02/2020   Procedure: ENDOSCOPIC RETROGRADE CHOLANGIOPANCREATOGRAPHY (ERCP);  Surgeon: [name]., MD;  Location: [facility] ENDOSCOPY;  Service: Gastroenterology;  Laterality: N/A;   ESOPHAGOGASTRODUODENOSCOPY N/A 11/02/2020   Procedure: ESOPHAGOGASTRODUODENOSCOPY (EGD);  Surgeon: [name]., MD;  Location: [facility] ENDOSCOPY;   Service: Gastroenterology;  Laterality: N/A;   ESOPHAGOGASTRODUODENOSCOPY (EGD) WITH PROPOFOL N/A 11/06/2019   Procedure: ESOPHAGOGASTRODUODENOSCOPY (EGD) WITH PROPOFOL;  Surgeon: Hilarie Fredrickson, MD;  Location: [facility] ENDOSCOPY;  Service: Endoscopy;  Laterality: N/A;   HEMOSTASIS CLIP PLACEMENT  11/02/2019   Procedure: HEMOSTASIS CLIP PLACEMENT;  Surgeon: Hilarie Fredrickson, MD;  Location: [facility] ENDOSCOPY;  Service: Endoscopy;;   NO PAST SURGERIES     REMOVAL OF STONES  11/02/2020   Procedure: REMOVAL OF STONES;  Surgeon: Meridee Score Netty Starring., MD;  Location: [facility] ENDOSCOPY;  Service: Gastroenterology;;   Francine Graven REMOVAL  11/02/2020   Procedure: STENT REMOVAL;  Surgeon: [name]., MD;  Location: [facility] ENDOSCOPY;  Service: Gastroenterology;;     Current Outpatient Medications  Medication Sig Dispense Refill   acetaminophen (TYLENOL) 325 MG tablet Take 2 tablets (650 mg total) by mouth every 6 (six) hours as needed for mild pain (or Fever >/= 101).     famotidine (PEPCID) 20 MG tablet Take 1 tablet (20 mg total) by mouth 2 (two) times daily. Instructions: Take Pepcid 20 mg twice daily x30 days, then take Pepcid 20 mg daily thereafter. (Patient taking differently: Take 20 mg by mouth daily. Instructions: Take Pepcid 20 mg twice daily x30 days, then take Pepcid 20 mg daily thereafter.) 360 tablet 0   feeding supplement (ENSURE ENLIVE / ENSURE PLUS) LIQD Take 237 mLs by mouth 2 (two) times daily between meals. 237 mL 12   Naphazoline-Pheniramine (VISINE-A OP) Place 1 drop into both eyes daily as needed (red eyes).     promethazine (PHENERGAN) 25 MG tablet Take 1 tablet (25 mg total) by mouth every 6 (six) hours as needed for nausea or vomiting. 30 tablet 0   telmisartan (MICARDIS) 40 MG tablet Take 1 tablet (40 mg total) by mouth daily. 30 tablet 0   lipase/protease/amylase (CREON) 36000 UNITS CPEP capsule Take 2 capsules (72,000 Units total) by mouth 3 (three) times daily with meals. (Patient not taking:  Reported on 05/22/2021) 180 capsule 0   No current facility-administered medications for this visit.    Allergies:   Diclofenac, Norvasc [amlodipine besylate], Omeprazole, and Prednisone    Social History:  The patient  reports that he has never smoked. He has never used smokeless tobacco. He reports that he does not currently use alcohol. He reports that he does not use drugs.   Family History:  The patient's family history includes Colon cancer in his cousin; Diabetes in his sister; Hypertension in his brother, father, mother, sister, and another family member; Kidney disease in his father; Pancreatic cancer in his paternal grandmother.    ROS:  Please see the history of present illness.   Otherwise, review of systems are positive for none.   All other systems are reviewed and negative.    PHYSICAL EXAM: VS:  BP 122/88 (BP Location: Right Arm, Patient Position: Sitting, Cuff Size: Normal)    Pulse 83    Ht 6\' 1"  (1.854 m)  Wt 169 lb 9.6 oz (76.9 kg)    BMI 22.38 kg/m  , BMI Body mass index is 22.38 kg/m. GENERAL:  Well appearing HEENT:  Pupils equal round and reactive, fundi not visualized, oral mucosa unremarkable NECK:  No jugular venous distention, waveform within normal limits, carotid upstroke brisk and symmetric, no bruits, no thyromegaly LUNGS:  Clear to auscultation bilaterally HEART:  RRR.  PMI not displaced or sustained,S1 and S2 within normal limits, no S3, no S4, no clicks, no rubs, no murmurs ABD:  Flat, positive bowel sounds normal in frequency in pitch, no bruits, no rebound, no guarding, no midline pulsatile mass, no hepatomegaly, no splenomegaly EXT:  2 plus pulses throughout, no edema, no cyanosis no clubbing SKIN:  No rashes no nodules NEURO:  Cranial nerves II through XII grossly intact, motor grossly intact throughout PSYCH:  Cognitively intact, oriented to person place and time    EKG:  EKG is ordered today. The ekg ordered today demonstrates sinus rhythm.   Rate 83 bpm.  LVH with repolarization abnormality.   Recent Labs: 12/23/2020: B Natriuretic Peptide 53.1 12/27/2020: TSH 1.286 02/04/2021: Magnesium 1.7 04/22/2021: ALT 24 04/25/2021: Hemoglobin 11.6; Platelets 170 04/26/2021: BUN 5; Creatinine, Ser 1.13; Potassium 3.6; Sodium 138    Lipid Panel    Component Value Date/Time   CHOL 159 05/29/2019 0553   TRIG 32 08/21/2019 0639   HDL 56 05/29/2019 0553   CHOLHDL 2.8 05/29/2019 0553   VLDL 13 05/29/2019 0553   LDLCALC 90 05/29/2019 0553      Wt Readings from Last 3 Encounters:  05/22/21 169 lb 9.6 oz (76.9 kg)  04/21/21 160 lb (72.6 kg)  02/28/21 165 lb (74.8 kg)      ASSESSMENT AND PLAN:  Essential hypertension Blood pressure stable on telmisartan.  He has a solitary kidney.  Renal function stable.  Shortness of breath Mr. Bird has shortness of breath that is worse at night.  He also has some wheezing, though none today.  He does not have any GERD symptoms.  He has been doing well on famotidine.  We will have him see pulmonary get pulmonary function test to see if this could be contributing.  His echo, stress test, and chest CT have been unremarkable.  Chest pain of uncertain etiology Resolved.  Cardiac testing has been negative.    Current medicines are reviewed at length with the patient today.  The patient does not have concerns regarding medicines.  The following changes have been made:  none  Labs/ tests ordered today include:   Orders Placed This Encounter  Procedures   Ambulatory referral to Pulmonology   EKG 12-Lead   Pulmonary function test     Disposition:   FU with Lareina Espino C. Duke Salvia, MD, [facility] as needed      Signed, Eriel Dunckel C. Duke Salvia, MD, [facility]  05/22/2021 5:28 PM    [facility]

## 2021-05-22 NOTE — Assessment & Plan Note (Signed)
Resolved.  Cardiac testing has been negative.

## 2021-05-22 NOTE — Patient Instructions (Signed)
Medication Instructions:  Your physician recommends that you continue on your current medications as directed. Please refer to the Current Medication list given to you today.   Labwork: NONE  Testing/Procedures: Your physician has recommended that you have a pulmonary function test. Pulmonary Function Tests are a group of tests that measure how well air moves in and out of your lungs.  Follow-Up: AS NEEDED

## 2021-05-22 NOTE — Assessment & Plan Note (Signed)
Blood pressure stable on telmisartan.  He has a solitary kidney.  Renal function stable.

## 2021-05-22 NOTE — Assessment & Plan Note (Addendum)
Kyle Berry has shortness of breath that is worse at night.  He also has some wheezing, though none today.  He does not have any GERD symptoms.  He has been doing well on famotidine.  We will have him see pulmonary get pulmonary function test to see if this could be contributing.  His echo, stress test, and chest CT have been unremarkable.

## 2021-05-23 ENCOUNTER — Telehealth (HOSPITAL_BASED_OUTPATIENT_CLINIC_OR_DEPARTMENT_OTHER): Payer: Self-pay | Admitting: Cardiovascular Disease

## 2021-05-23 NOTE — Telephone Encounter (Signed)
Left message for patent regarding Tuesday 06/06/21 at 9:00 am PFT testing at [facility]---arrival time is 8:45 am 1st floor admissions office for check in .  To have COVID prescreening Friday 06/02/21 between 8am--3pm--[facility].  Will mail information to patient and requested he call with questions or concerns.

## 2021-06-05 NOTE — Progress Notes (Unsigned)
Synopsis: Referred for shortness of breath by Skeet Latch, MD  Subjective:   PATIENT ID: Kyle Berry GENDER: male DOB: [birth year], MRN: [id number]  No chief complaint on file.  51 y.o. male with hypertension, hyperlipidemia, congenital solitary kidney, CKD 2, and recurrent pancreatitis, recent incidentally noted covid-19 infection referred for dyspnea and wheeze worse at night.   Stress test 02/2021 low/intermediate risk (EF likely underestimated). TTE 11/2020 with only mild LVH.  Otherwise pertinent review of systems is negative.  Past Medical History:  Diagnosis Date   Atrophy of right kidney    Chronic bronchitis (HCC)    Chronic lower back pain    Chronic pancreatitis ([place])    Gastroparesis 06/2020   confirmed by emptying study at [facility]   GERD (gastroesophageal reflux disease)    Headache    "once/month" (05/13/2017)   High cholesterol    Hypertension    Migraine    "a couple/year" (05/13/2017)   Nephrolithiasis 05/13/2017   Pancreatitis    Pneumonia ~ 09/2015   Presence of pancreatic duct stent    Recurrent acute pancreatitis    Shortness of breath 05/22/2021     Family History  Problem Relation Age of Onset   Hypertension Other    Hypertension Mother    Hypertension Father    Kidney disease Father    Hypertension Sister    Diabetes Sister    Hypertension Brother    Pancreatic cancer Paternal Grandmother    Colon cancer Cousin    Stomach cancer Neg Hx    Esophageal cancer Neg Hx      Past Surgical History:  Procedure Laterality Date   BIOPSY  11/02/2020   Procedure: BIOPSY;  Surgeon: Mansouraty, Telford Nab., MD;  Location: [facility] ENDOSCOPY;  Service: Gastroenterology;;   CHOLECYSTECTOMY N/A 10/23/2017   Procedure: LAPAROSCOPIC CHOLECYSTECTOMY WITH INTRAOPERATIVE CHOLANGIOGRAM;  Surgeon: Johnathan Hausen, MD;  Location: [facility] ORS;  Service: General;  Laterality: N/A;   COLONOSCOPY WITH PROPOFOL N/A 11/02/2019   Procedure: COLONOSCOPY WITH PROPOFOL;  Surgeon:  Irene Shipper, MD;  Location: [facility] ENDOSCOPY;  Service: Endoscopy;  Laterality: N/A;   ERCP N/A 11/02/2020   Procedure: ENDOSCOPIC RETROGRADE CHOLANGIOPANCREATOGRAPHY (ERCP);  Surgeon: Irving Copas., MD;  Location: [place];  Service: Gastroenterology;  Laterality: N/A;   ESOPHAGOGASTRODUODENOSCOPY N/A 11/02/2020   Procedure: ESOPHAGOGASTRODUODENOSCOPY (EGD);  Surgeon: Irving Copas., MD;  Location: [place];  Service: Gastroenterology;  Laterality: N/A;   ESOPHAGOGASTRODUODENOSCOPY (EGD) WITH PROPOFOL N/A 11/06/2019   Procedure: ESOPHAGOGASTRODUODENOSCOPY (EGD) WITH PROPOFOL;  Surgeon: Irene Shipper, MD;  Location: [facility] ENDOSCOPY;  Service: Endoscopy;  Laterality: N/A;   HEMOSTASIS CLIP PLACEMENT  11/02/2019   Procedure: HEMOSTASIS CLIP PLACEMENT;  Surgeon: Irene Shipper, MD;  Location: [facility] ENDOSCOPY;  Service: Endoscopy;;   NO PAST SURGERIES     REMOVAL OF STONES  11/02/2020   Procedure: REMOVAL OF STONES;  Surgeon: Irving Copas., MD;  Location: [place];  Service: Gastroenterology;;   Lavell Islam REMOVAL  11/02/2020   Procedure: STENT REMOVAL;  Surgeon: Irving Copas., MD;  Location: [facility] ENDOSCOPY;  Service: Gastroenterology;;    Social History   Socioeconomic History   Marital status: Single    Spouse name: Not on file   Number of children: Not on file   Years of education: Not on file   Highest education level: Not on file  Occupational History   Occupation: Engineer, building services  Tobacco Use   Smoking status: Never   Smokeless tobacco: Never  Vaping Use  Vaping Use: Never used  Substance and Sexual Activity   Alcohol use: Not Currently    Comment: remote h/o heavy use   Drug use: No   Sexual activity: Yes    Partners: Female    Birth control/protection: Condom  Other Topics Concern   Not on file  Social History Narrative   Not on file   Social Determinants of Health   Financial Resource Strain: Not on file  Food Insecurity: Not on file   Transportation Needs: Not on file  Physical Activity: Not on file  Stress: Not on file  Social Connections: Not on file  Intimate Partner Violence: Not on file     Allergies  Allergen Reactions   Diclofenac Hives   Norvasc [Amlodipine Besylate] Other (See Comments)    Fluid buildup in chest   Omeprazole Other (See Comments)    MD stopped due to pancreatitis   Prednisone Other (See Comments)    Mood swings     Outpatient Medications Prior to Visit  Medication Sig Dispense Refill   acetaminophen (TYLENOL) 325 MG tablet Take 2 tablets (650 mg total) by mouth every 6 (six) hours as needed for mild pain (or Fever >/= 101).     famotidine (PEPCID) 20 MG tablet Take 1 tablet (20 mg total) by mouth 2 (two) times daily. Instructions: Take Pepcid 20 mg twice daily x30 days, then take Pepcid 20 mg daily thereafter. (Patient taking differently: Take 20 mg by mouth daily. Instructions: Take Pepcid 20 mg twice daily x30 days, then take Pepcid 20 mg daily thereafter.) 360 tablet 0   feeding supplement (ENSURE ENLIVE / ENSURE PLUS) LIQD Take 237 mLs by mouth 2 (two) times daily between meals. 237 mL 12   lipase/protease/amylase (CREON) 36000 UNITS CPEP capsule Take 2 capsules (72,000 Units total) by mouth 3 (three) times daily with meals. (Patient not taking: Reported on 05/22/2021) 180 capsule 0   Naphazoline-Pheniramine (VISINE-A OP) Place 1 drop into both eyes daily as needed (red eyes).     promethazine (PHENERGAN) 25 MG tablet Take 1 tablet (25 mg total) by mouth every 6 (six) hours as needed for nausea or vomiting. 30 tablet 0   telmisartan (MICARDIS) 40 MG tablet Take 1 tablet (40 mg total) by mouth daily. 30 tablet 0   No facility-administered medications prior to visit.       Objective:   Physical Exam:  General appearance: 51 y.o., male, NAD, conversant  Eyes: anicteric sclerae; PERRL, tracking appropriately HENT: NCAT; MMM Neck: Trachea midline; no lymphadenopathy, no  JVD Lungs: CTAB, no crackles, no wheeze, with normal respiratory effort CV: RRR, no murmur  Abdomen: Soft, non-tender; non-distended, BS present  Extremities: No peripheral edema, warm Skin: Normal turgor and texture; no rash Psych: Appropriate affect Neuro: Alert and oriented to person and place, no focal deficit     There were no vitals filed for this visit.   on *** LPM *** RA BMI Readings from Last 3 Encounters:  05/22/21 22.38 kg/m  04/21/21 21.11 kg/m  02/28/21 21.77 kg/m   Wt Readings from Last 3 Encounters:  05/22/21 169 lb 9.6 oz (76.9 kg)  04/21/21 160 lb (72.6 kg)  02/28/21 165 lb (74.8 kg)     CBC    Component Value Date/Time   WBC 5.6 04/25/2021 0034   RBC 3.44 (L) 04/25/2021 0034   HGB 11.6 (L) 04/25/2021 0034   HCT 34.7 (L) 04/25/2021 0034   PLT 170 04/25/2021 0034   MCV 100.9 (H) 04/25/2021  0034   MCH 33.7 04/25/2021 0034   MCHC 33.4 04/25/2021 0034   RDW 15.9 (H) 04/25/2021 0034   LYMPHSABS 1.2 04/21/2021 0646   MONOABS 0.4 04/21/2021 0646   EOSABS 0.0 04/21/2021 0646   BASOSABS 0.0 04/21/2021 0646    ***  Chest Imaging:  CTA Chest dissection protocol reviewed by me 11/2020 remarkable for ggo lul  CT lung bases 04/2021 reviewed by me without abnormality in lung parenchyma  Pulmonary Functions Testing Results: No flowsheet data found.   Echocardiogram:  TTE 11/18/20:  1. Left ventricular ejection fraction by 3D volume is 57 %. The left  ventricle has normal function. The left ventricle has no regional wall  motion abnormalities. There is mild left ventricular hypertrophy. Left  ventricular diastolic parameters were  normal.   2. Right ventricular systolic function is normal. The right ventricular  size is normal.   3. The mitral valve is grossly normal. Trivial mitral valve  regurgitation. No evidence of mitral stenosis.   4. The aortic valve is normal in structure. Aortic valve regurgitation is  not visualized. No aortic stenosis is  present.   5. Aortic dilatation noted. There is borderline dilatation of the  ascending aorta, measuring 39 mm.       Assessment & Plan:   # Dyspnea  Plan:      Maryjane Hurter, MD [facility] 06/05/2021 5:59 PM

## 2021-06-06 ENCOUNTER — Inpatient Hospital Stay (HOSPITAL_COMMUNITY): Admission: RE | Admit: 2021-06-06 | Payer: Managed Care, Other (non HMO) | Source: Ambulatory Visit

## 2021-06-07 ENCOUNTER — Institutional Professional Consult (permissible substitution): Payer: Managed Care, Other (non HMO) | Admitting: Student

## 2021-06-21 ENCOUNTER — Emergency Department (HOSPITAL_COMMUNITY): Payer: Managed Care, Other (non HMO)

## 2021-06-21 ENCOUNTER — Inpatient Hospital Stay (HOSPITAL_COMMUNITY)
Admission: EM | Admit: 2021-06-21 | Discharge: 2021-06-24 | DRG: 377 | Disposition: A | Discharge status: Home / Self Care | Payer: Managed Care, Other (non HMO) | Attending: Internal Medicine | Admitting: Internal Medicine

## 2021-06-21 DIAGNOSIS — I10 Essential (primary) hypertension: Secondary | ICD-10-CM | POA: Diagnosis present

## 2021-06-21 DIAGNOSIS — T80818A Extravasation of other vesicant agent, initial encounter: Secondary | ICD-10-CM | POA: Diagnosis present

## 2021-06-21 DIAGNOSIS — Z79899 Other long term (current) drug therapy: Secondary | ICD-10-CM | POA: Diagnosis not present

## 2021-06-21 DIAGNOSIS — K3184 Gastroparesis: Secondary | ICD-10-CM | POA: Diagnosis present

## 2021-06-21 DIAGNOSIS — Q600 Renal agenesis, unilateral: Secondary | ICD-10-CM

## 2021-06-21 DIAGNOSIS — Z8249 Family history of ischemic heart disease and other diseases of the circulatory system: Secondary | ICD-10-CM

## 2021-06-21 DIAGNOSIS — K5791 Diverticulosis of intestine, part unspecified, without perforation or abscess with bleeding: Secondary | ICD-10-CM | POA: Diagnosis not present

## 2021-06-21 DIAGNOSIS — T801XXA Vascular complications following infusion, transfusion and therapeutic injection, initial encounter: Secondary | ICD-10-CM

## 2021-06-21 DIAGNOSIS — D62 Acute posthemorrhagic anemia: Secondary | ICD-10-CM | POA: Diagnosis not present

## 2021-06-21 DIAGNOSIS — Z20822 Contact with and (suspected) exposure to covid-19: Secondary | ICD-10-CM | POA: Diagnosis present

## 2021-06-21 DIAGNOSIS — K8500 Idiopathic acute pancreatitis without necrosis or infection: Secondary | ICD-10-CM | POA: Diagnosis present

## 2021-06-21 DIAGNOSIS — Q453 Other congenital malformations of pancreas and pancreatic duct: Secondary | ICD-10-CM

## 2021-06-21 DIAGNOSIS — K922 Gastrointestinal hemorrhage, unspecified: Secondary | ICD-10-CM | POA: Diagnosis present

## 2021-06-21 DIAGNOSIS — T801XXS Vascular complications following infusion, transfusion and therapeutic injection, sequela: Secondary | ICD-10-CM

## 2021-06-21 DIAGNOSIS — E7800 Pure hypercholesterolemia, unspecified: Secondary | ICD-10-CM | POA: Diagnosis present

## 2021-06-21 DIAGNOSIS — K921 Melena: Secondary | ICD-10-CM | POA: Diagnosis not present

## 2021-06-21 DIAGNOSIS — K859 Acute pancreatitis without necrosis or infection, unspecified: Secondary | ICD-10-CM | POA: Diagnosis present

## 2021-06-21 DIAGNOSIS — Z888 Allergy status to other drugs, medicaments and biological substances status: Secondary | ICD-10-CM

## 2021-06-21 DIAGNOSIS — K219 Gastro-esophageal reflux disease without esophagitis: Secondary | ICD-10-CM | POA: Diagnosis present

## 2021-06-21 DIAGNOSIS — K861 Other chronic pancreatitis: Secondary | ICD-10-CM | POA: Diagnosis present

## 2021-06-21 LAB — CBC
HCT: 36.8 % — ABNORMAL LOW (ref 39.0–52.0)
Hemoglobin: 12.6 g/dL — ABNORMAL LOW (ref 13.0–17.0)
MCH: 34.2 pg — ABNORMAL HIGH (ref 26.0–34.0)
MCHC: 34.2 g/dL (ref 30.0–36.0)
MCV: 100 fL (ref 80.0–100.0)
Platelets: 207 10*3/uL (ref 150–400)
RBC: 3.68 MIL/uL — ABNORMAL LOW (ref 4.22–5.81)
RDW: 14.8 % (ref 11.5–15.5)
WBC: 7.9 10*3/uL (ref 4.0–10.5)
nRBC: 0 % (ref 0.0–0.2)

## 2021-06-21 LAB — CBC WITH DIFFERENTIAL/PLATELET
Abs Immature Granulocytes: 0.04 10*3/uL (ref 0.00–0.07)
Abs Immature Granulocytes: 0.02 10*3/uL (ref 0.00–0.07)
Basophils Absolute: 0 10*3/uL (ref 0.0–0.1)
Basophils Absolute: 0 10*3/uL (ref 0.0–0.1)
Basophils Relative: 0 %
Basophils Relative: 0 %
Eosinophils Absolute: 0.1 10*3/uL (ref 0.0–0.5)
Eosinophils Absolute: 0.1 10*3/uL (ref 0.0–0.5)
Eosinophils Relative: 1 %
Eosinophils Relative: 1 %
HCT: 41.3 % (ref 39.0–52.0)
HCT: 31.9 % — ABNORMAL LOW (ref 39.0–52.0)
Hemoglobin: 13 g/dL (ref 13.0–17.0)
Hemoglobin: 10.4 g/dL — ABNORMAL LOW (ref 13.0–17.0)
Immature Granulocytes: 1 %
Immature Granulocytes: 0 %
Lymphocytes Relative: 27 %
Lymphocytes Relative: 26 %
Lymphs Abs: 2.3 10*3/uL (ref 0.7–4.0)
Lymphs Abs: 1.7 10*3/uL (ref 0.7–4.0)
MCH: 32.7 pg (ref 26.0–34.0)
MCH: 33.3 pg (ref 26.0–34.0)
MCHC: 31.5 g/dL (ref 30.0–36.0)
MCHC: 32.6 g/dL (ref 30.0–36.0)
MCV: 103.8 fL — ABNORMAL HIGH (ref 80.0–100.0)
MCV: 102.2 fL — ABNORMAL HIGH (ref 80.0–100.0)
Monocytes Absolute: 0.5 10*3/uL (ref 0.1–1.0)
Monocytes Absolute: 0.4 10*3/uL (ref 0.1–1.0)
Monocytes Relative: 6 %
Monocytes Relative: 6 %
Neutro Abs: 5.7 10*3/uL (ref 1.7–7.7)
Neutro Abs: 4.2 10*3/uL (ref 1.7–7.7)
Neutrophils Relative %: 65 %
Neutrophils Relative %: 67 %
Platelets: 189 10*3/uL (ref 150–400)
Platelets: 163 10*3/uL (ref 150–400)
RBC: 3.98 MIL/uL — ABNORMAL LOW (ref 4.22–5.81)
RBC: 3.12 MIL/uL — ABNORMAL LOW (ref 4.22–5.81)
RDW: 15 % (ref 11.5–15.5)
RDW: 15.2 % (ref 11.5–15.5)
WBC: 8.7 10*3/uL (ref 4.0–10.5)
WBC: 6.4 10*3/uL (ref 4.0–10.5)
nRBC: 0 % (ref 0.0–0.2)
nRBC: 0 % (ref 0.0–0.2)

## 2021-06-21 LAB — COMPREHENSIVE METABOLIC PANEL
ALT: 20 U/L (ref 0–44)
AST: 32 U/L (ref 15–41)
Albumin: 4.1 g/dL (ref 3.5–5.0)
Alkaline Phosphatase: 57 U/L (ref 38–126)
Anion gap: 13 (ref 5–15)
BUN: 8 mg/dL (ref 6–20)
CO2: 20 mmol/L — ABNORMAL LOW (ref 22–32)
Calcium: 9.3 mg/dL (ref 8.9–10.3)
Chloride: 103 mmol/L (ref 98–111)
Creatinine, Ser: 1.17 mg/dL (ref 0.61–1.24)
GFR, Estimated: >60 mL/min (ref >60)
Glucose, Bld: 105 mg/dL — ABNORMAL HIGH (ref 70–99)
Potassium: 3.6 mmol/L (ref 3.5–5.1)
Sodium: 136 mmol/L (ref 135–145)
Total Bilirubin: 1.6 mg/dL — ABNORMAL HIGH (ref 0.3–1.2)
Total Protein: 7.5 g/dL (ref 6.5–8.1)

## 2021-06-21 LAB — HIV ANTIBODY (ROUTINE TESTING W REFLEX): HIV Screen 4th Generation wRfx: NONREACTIVE

## 2021-06-21 LAB — TYPE AND SCREEN
ABO/RH(D): O POS
Antibody Screen: NEGATIVE

## 2021-06-21 LAB — LIPASE, BLOOD: Lipase: 190 U/L — ABNORMAL HIGH (ref 11–51)

## 2021-06-21 LAB — POC OCCULT BLOOD, ED: Fecal Occult Bld: NEGATIVE

## 2021-06-21 MED ORDER — SODIUM CHLORIDE 0.9 % IV BOLUS
1000.0000 mL | Freq: Once | INTRAVENOUS | Status: AC
  Administered 2021-06-21: 1000 mL via INTRAVENOUS

## 2021-06-21 MED ORDER — HYDROMORPHONE HCL 1 MG/ML IJ SOLN
1.0000 mg | INTRAMUSCULAR | Status: DC | PRN
Start: 2021-06-21 — End: 2021-06-22
  Administered 2021-06-21 (×2): 1 mg via INTRAVENOUS
  Filled 2021-06-21 (×2): qty 1

## 2021-06-21 MED ORDER — PANCRELIPASE (LIP-PROT-AMYL) 36000-114000 UNITS PO CPEP
72000.0000 [IU] | ORAL_CAPSULE | Freq: Three times a day (TID) | ORAL | Status: DC
  Administered 2021-06-21 – 2021-06-24 (×7): 72000 [IU] via ORAL
  Filled 2021-06-21 (×7): qty 2

## 2021-06-21 MED ORDER — ACETAMINOPHEN 650 MG RE SUPP: 650.0000 mg | Freq: Four times a day (QID) | RECTAL | Status: DC | PRN

## 2021-06-21 MED ORDER — HYDROMORPHONE HCL 1 MG/ML IJ SOLN
1.0000 mg | Freq: Once | INTRAMUSCULAR | Status: AC
  Administered 2021-06-21: 1 mg via INTRAVENOUS
  Filled 2021-06-21: qty 1

## 2021-06-21 MED ORDER — ONDANSETRON HCL 4 MG/2ML IJ SOLN
4.0000 mg | Freq: Four times a day (QID) | INTRAMUSCULAR | Status: DC | PRN
  Administered 2021-06-22: 4 mg via INTRAVENOUS
  Filled 2021-06-21: qty 2

## 2021-06-21 MED ORDER — HYDRALAZINE HCL 20 MG/ML IJ SOLN
5.0000 mg | Freq: Three times a day (TID) | INTRAMUSCULAR | Status: DC | PRN
  Administered 2021-06-21: 5 mg via INTRAVENOUS
  Filled 2021-06-21: qty 1

## 2021-06-21 MED ORDER — ONDANSETRON HCL 4 MG PO TABS: 4.0000 mg | ORAL_TABLET | Freq: Four times a day (QID) | ORAL | Status: DC | PRN

## 2021-06-21 MED ORDER — IOHEXOL 300 MG/ML  SOLN
100.0000 mL | Freq: Once | INTRAMUSCULAR | Status: AC | PRN
  Administered 2021-06-21: 100 mL via INTRAVENOUS

## 2021-06-21 MED ORDER — PANTOPRAZOLE SODIUM 40 MG IV SOLR
40.0000 mg | Freq: Two times a day (BID) | INTRAVENOUS | Status: DC
  Administered 2021-06-21 – 2021-06-24 (×6): 40 mg via INTRAVENOUS
  Filled 2021-06-21 (×6): qty 10

## 2021-06-21 MED ORDER — ONDANSETRON HCL 4 MG/2ML IJ SOLN
4.0000 mg | Freq: Once | INTRAMUSCULAR | Status: AC
  Administered 2021-06-21: 4 mg via INTRAVENOUS
  Filled 2021-06-21: qty 2

## 2021-06-21 MED ORDER — ACETAMINOPHEN 325 MG PO TABS: 650.0000 mg | ORAL_TABLET | Freq: Four times a day (QID) | ORAL | Status: DC | PRN

## 2021-06-21 NOTE — Progress Notes (Signed)
Interventional Radiology Brief Note: ? ?PA called to CT for contast extravasation.  ? ?Pt s/p CT this AM with subsequent extravasation of saline/contrast into right antecubital venous acess site.  ?Area currently soft but edematous, NT, intact distal pulses and neuro fxn ok. ? ?Recommend cool compress (can alternate with warm compresses as needed) to site and arm elevation as much as possible over next 24 hrs to promote adequate venous drainage.  ? ?IV removed.  ? ?Will follow-up tomorrow.  ? ?Brynda Greathouse, MS RD PA-C ? ? ? ?

## 2021-06-21 NOTE — ED Notes (Signed)
IV team at bedside 

## 2021-06-21 NOTE — ED Notes (Signed)
This RN notified that PIV infiltrated during CT scan. Will place order for IV team.  ?

## 2021-06-21 NOTE — ED Notes (Signed)
Urine sent to lab 

## 2021-06-21 NOTE — Assessment & Plan Note (Addendum)
-  appears mild ?-has been out of his pancreatic enzymes due to cost ?-pain controlled ?-diet advanced ?

## 2021-06-21 NOTE — Assessment & Plan Note (Addendum)
-  h/h stable ?-no further bleeding noted ?

## 2021-06-21 NOTE — ED Triage Notes (Signed)
Pt. Stated, Kyle Berry had a flare up of pancreatitis going on 3 weeks. ?

## 2021-06-21 NOTE — ED Provider Triage Note (Signed)
Emergency Medicine Provider Triage Evaluation Note ? ?Kyle Berry , a 51 y.o. male  was evaluated in triage.  Pt complains of abdominal pain, emesis and bloody stools.  He has a history of chronic pancreatitis and feels like this is a flareup.  He will sometimes get bloody stools with his pancreatitis although "not this bad."  Reports bright red blood per rectum this morning.  Has not been taking Creon due to cost of the medication.  He sees a Sales promotion account executive at [facility]. ? ?Review of Systems  ?Positive: Abdominal pain, vomiting, bloody stools ?Negative: Urinary symptom ? ?Physical Exam  ?BP (!) 180/118 (BP Location: Right Arm)   Pulse 97   Temp 98.3 ?F (36.8 ?C) (Oral)   Resp 18   SpO2 98%  ?Gen:   Awake, no distress   ?Resp:  Normal effort  ?MSK:   Moves extremities without difficulty  ?Other:  Abdomen tender in the epigastric area without rebound or guarding ? ?Medical Decision Making  ?Medically screening exam initiated at 11:13 AM.  Appropriate orders placed.  Kyle Berry was informed that the remainder of the evaluation will be completed by another provider, this initial triage assessment does not replace that evaluation, and the importance of remaining in the ED until their evaluation is complete. ? ?Labs ordered ?  ?Delia Heady, PA-C ?06/21/21 1114 ? ?

## 2021-06-21 NOTE — ED Provider Notes (Signed)
[facility] EMERGENCY DEPARTMENT Provider Note   CSN: [id number] Arrival date & time: 06/21/21  1049     History  Chief Complaint  Patient presents with   Abdominal Pain   Rectal Bleeding   Pancreatitis   Emesis   Nausea    Kyle Berry is a 51 y.o. male with a past medical history of chronic pancreatitis, hypertension, congenital single kidney presenting to the ED with a chief complaint of epigastric pain, emesis and bloody stools.  He has had intermittent abdominal pain for the past several days as well as nonbloody, nonbilious emesis.  States that he had increased amount of rectal bleeding this morning with a bowel movement and blood when he wiped the area as well.  He is unfortunately unable to take his Creon due to the price of the medication.  He states that this feels similar to his episodes of pancreatitis however the amount of rectal bleeding is unusual for him.  He has not tried any other medications to help with his symptoms.  Denies any chest pain, shortness of breath, fever, sick contacts with similar symptoms or suspicious food intake.   Abdominal Pain Associated symptoms: hematochezia and vomiting   Associated symptoms: no chest pain, no chills, no constipation, no cough, no diarrhea, no dysuria, no fever, no hematuria, no nausea, no shortness of breath and no sore throat   Rectal Bleeding Associated symptoms: abdominal pain and vomiting   Associated symptoms: no dizziness, no fever and no light-headedness   Emesis Associated symptoms: abdominal pain   Associated symptoms: no chills, no cough, no diarrhea, no fever, no myalgias and no sore throat       Home Medications Prior to Admission medications   Medication Sig Start Date End Date Taking? Authorizing Provider  acetaminophen (TYLENOL) 325 MG tablet Take 2 tablets (650 mg total) by mouth every 6 (six) hours as needed for mild pain (or Fever >/= 101). Patient taking differently: Take 650  mg by mouth every 6 (six) hours as needed for mild pain. 04/26/21  Yes Idamae Schuller, MD  famotidine (PEPCID) 20 MG tablet Take 1 tablet (20 mg total) by mouth 2 (two) times daily. Instructions: Take Pepcid 20 mg twice daily x30 days, then take Pepcid 20 mg daily thereafter. Patient taking differently: Take 20 mg by mouth daily. 11/03/20 08/19/21 Yes Hall, Lorenda Cahill, DO  feeding supplement (ENSURE ENLIVE / ENSURE PLUS) LIQD Take 237 mLs by mouth 2 (two) times daily between meals. 02/04/21  Yes Dahal, Marlowe Aschoff, MD  Naphazoline-Pheniramine (VISINE-A OP) Place 1 drop into both eyes daily as needed (red eyes).   Yes [provider]  promethazine (PHENERGAN) 25 MG tablet Take 1 tablet (25 mg total) by mouth every 6 (six) hours as needed for nausea or vomiting. 04/26/21 08/19/21 Yes Idamae Schuller, MD  telmisartan (MICARDIS) 40 MG tablet Take 1 tablet (40 mg total) by mouth daily. 04/26/21 08/18/21 Yes Idamae Schuller, MD  lipase/protease/amylase (CREON) 36000 UNITS CPEP capsule Take 2 capsules (72,000 Units total) by mouth 3 (three) times daily with meals. Patient not taking: Reported on 05/22/2021 12/31/20   Barb Merino, MD      Allergies    Diclofenac, Norvasc [amlodipine besylate], Omeprazole, and Prednisone    Review of Systems   Review of Systems  Constitutional:  Negative for appetite change, chills and fever.  HENT:  Negative for ear pain, rhinorrhea, sneezing and sore throat.   Eyes:  Negative for photophobia and visual disturbance.  Respiratory:  Negative for cough, chest tightness, shortness of breath and wheezing.   Cardiovascular:  Negative for chest pain and palpitations.  Gastrointestinal:  Positive for abdominal pain, hematochezia and vomiting. Negative for blood in stool, constipation, diarrhea and nausea.  Genitourinary:  Negative for dysuria, hematuria and urgency.  Musculoskeletal:  Negative for myalgias.  Skin:  Negative for rash.  Neurological:  Negative for dizziness, weakness and  light-headedness.   Physical Exam Updated Vital Signs BP (!) 186/105 (BP Location: Left Arm)    Pulse 67    Temp (!) 97.5 F (36.4 C) (Oral)    Resp 17    SpO2 100%  Physical Exam Vitals and nursing note reviewed. Exam conducted with a chaperone present.  Constitutional:      General: He is not in acute distress.    Appearance: He is well-developed.  HENT:     Head: Normocephalic and atraumatic.     Nose: Nose normal.  Eyes:     General: No scleral icterus.       Left eye: No discharge.     Conjunctiva/sclera: Conjunctivae normal.  Cardiovascular:     Rate and Rhythm: Normal rate and regular rhythm.     Heart sounds: Normal heart sounds. No murmur heard.   No friction rub. No gallop.  Pulmonary:     Effort: Pulmonary effort is normal. No respiratory distress.     Breath sounds: Normal breath sounds.  Abdominal:     General: Bowel sounds are normal. There is no distension.     Palpations: Abdomen is soft.     Tenderness: There is abdominal tenderness in the epigastric area. There is no guarding.  Genitourinary:    Comments: Brown stool seen on rectal exam without evidence of gross blood. Musculoskeletal:        General: Normal range of motion.     Cervical back: Normal range of motion and neck supple.  Skin:    General: Skin is warm and dry.     Findings: No rash.  Neurological:     Mental Status: He is alert.     Motor: No abnormal muscle tone.     Coordination: Coordination normal.    ED Results / Procedures / Treatments   Labs (all labs ordered are listed, but only abnormal results are displayed) Labs Reviewed  COMPREHENSIVE METABOLIC PANEL - Abnormal; Notable for the following components:      Result Value   CO2 20 (*)    Glucose, Bld 105 (*)    Total Bilirubin 1.6 (*)    All other components within normal limits  LIPASE, BLOOD - Abnormal; Notable for the following components:   Lipase 190 (*)    All other components within normal limits  CBC WITH  DIFFERENTIAL/PLATELET - Abnormal; Notable for the following components:   RBC 3.98 (*)    MCV 103.8 (*)    All other components within normal limits  CBC WITH DIFFERENTIAL/PLATELET - Abnormal; Notable for the following components:   RBC 3.12 (*)    Hemoglobin 10.4 (*)    HCT 31.9 (*)    MCV 102.2 (*)    All other components within normal limits  POC OCCULT BLOOD, ED  TYPE AND SCREEN    EKG None  Radiology CT ABDOMEN PELVIS W CONTRAST  Result Date: 06/21/2021 CLINICAL DATA:  Epigastric discomfort. Rectal bleeding. History of pancreatitis. History of chronic pancreatitis. Bright red blood per rectum. EXAM: CT ABDOMEN AND PELVIS WITH CONTRAST TECHNIQUE: Multidetector CT imaging of the abdomen  and pelvis was performed using the standard protocol following bolus administration of intravenous contrast. RADIATION DOSE REDUCTION: This exam was performed according to the departmental dose-optimization program which includes automated exposure control, adjustment of the mA and/or kV according to patient size and/or use of iterative reconstruction technique. CONTRAST:  156m OMNIPAQUE IOHEXOL 300 MG/ML  SOLN COMPARISON:  04/21/2021 FINDINGS: Lower chest: Clear lung bases. Normal heart size without pericardial or pleural effusion. Hepatobiliary: Normal liver. Cholecystectomy. No intra or extrahepatic biliary duct dilatation. No choledocholithiasis. Pancreas: Mild pancreatic atrophy. Parenchymal calcification in the head is likely related to chronic calcific pancreatitis. Moderate diffuse peripancreatic edema with thickening of the anterior renal fascia. Relatively similar to 04/21/2021. No pancreatic necrosis, well-circumscribed peripancreatic fluid collection, or duct dilatation. Spleen: Normal in size, without focal abnormality. Adrenals/Urinary Tract: Normal adrenal glands. Marked right renal atrophy. Lower pole left renal collecting system stones of up to 4 mm. No hydronephrosis. Normal urinary bladder.  Stomach/Bowel: Gastric antral underdistention. Normal colon, appendix, and terminal ileum. Normal small bowel. Vascular/Lymphatic: No splenic artery aneurysm. Normal aortic caliber. Patent portal and splenic veins. 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 Similar. No abdominopelvic adenopathy. Reproductive: Normal prostate. Other: No significant free fluid. Musculoskeletal: Degenerate disc disease and a disc bulge at the lumbosacral junction. IMPRESSION: 1. Moderate non complicated pancreatitis. 2. Right renal atrophy. 3. Left nephrolithiasis. Electronically Signed   By: KAbigail MiyamotoM.D.   On: 06/21/2021 16:28    Procedures Procedures    Medications Ordered in ED Medications  lipase/protease/amylase (CREON) capsule 72,000 Units (has no administration in time range)  pantoprazole (PROTONIX) injection 40 mg (has no administration in time range)  HYDROmorphone (DILAUDID) injection 1 mg (1 mg Intravenous Given 06/21/21 1828)  hydrALAZINE (APRESOLINE) injection 5 mg (5 mg Intravenous Given 06/21/21 1831)  sodium chloride 0.9 % bolus 1,000 mL (1,000 mLs Intravenous New Bag/Given 06/21/21 1537)  HYDROmorphone (DILAUDID) injection 1 mg (1 mg Intravenous Given 06/21/21 1538)  ondansetron (ZOFRAN) injection 4 mg (4 mg Intravenous Given 06/21/21 1538)  iohexol (OMNIPAQUE) 300 MG/ML solution 100 mL (100 mLs Intravenous Contrast Given 06/21/21 1616)    ED Course/ Medical Decision Making/ A&P Clinical Course as of 06/21/21 1834  Wed Jun 21, 2021  1502 Hemoglobin(!): 10.4 [HK]    Clinical Course User Index [HK] KDelia Heady PA-C                           Medical Decision Making Amount and/or Complexity of Data Reviewed Labs: ordered. Decision-making details documented in ED Course. Radiology: ordered.  Risk Prescription drug management. Decision regarding hospitalization.   51year old male with past medical history of chronic pancreatitis, congenital  single kidney presenting to the ED with a chief complaint of epigastric pain, emesis and rectal bleeding.  States that the amount of rectal bleeding is unusual for him.  He feels that the pain and emesis are related to a flareup of his pancreatitis.  He has not taken any medicine at home as he ran out of his pain medicine.  On exam there is epigastric tenderness.  Rectal exam revealed no gross blood.  Lab work shows lipase of 190 which is slightly improved from his prior 2 months ago.  CBC is unremarkable.  CMP is unremarkable.  Will obtain CT of the abdomen pelvis, await Hemoccult test and repeat CBC due to the amount of bleeding this morning that he described.   CBC shows hemoglobin has dropped 2.6  points since his arrival.  His Hemoccult is negative however due to the amount of blood seen in the image that he shown to me in the commode this morning I am concerned for GI bleed.  His CT scan shows pancreatitis.  Due to concern for GI bleed as well as uncontrolled symptoms of pancreatitis will need to admit to medicine service.  He has seen [facility] in the past so we will consult GI for their recommendations as well.  Of note he did have bright red blood per rectum as he described to me this morning and showed me a picture of it.  Attempted to get in touch with [facility] for their recommendations unfortunately I was unable to hear back from them prior to shift change.  I have informed admitting hospitalist that I have not received any recommendations or have been able to get in touch with GI so far.    Portions of this note were generated with [employer]. Dictation errors may occur despite best attempts at proofreading.        Final Clinical Impression(s) / ED Diagnoses Final diagnoses:  Acute pancreatitis without infection or necrosis, unspecified pancreatitis type  Gastrointestinal hemorrhage, unspecified gastrointestinal hemorrhage type    Rx / DC Orders ED Discharge  Orders     None         Delia Heady, PA-C 06/21/21 1834    Tegeler, Gwenyth Allegra, MD 06/22/21 [phone]

## 2021-06-21 NOTE — ED Notes (Signed)
Pt. Unable to urinate in triage ?

## 2021-06-21 NOTE — ED Notes (Signed)
Pt transported to CT via stretcher at this time.  

## 2021-06-21 NOTE — H&P (Signed)
History and Physical    Patient: Kyle Berry DOB: 1971/04/10 DOA: 06/21/2021 DOS: the patient was seen and examined on 06/21/2021 PCP: [name] (Inactive)  Patient coming from: Home  Chief Complaint:  Chief Complaint  Patient presents with   Abdominal Pain   Rectal Bleeding   Pancreatitis   Emesis   Nausea     HPI: Kyle Berry is a 51 y.o. male with medical history significant of chronic pancreatitis who presents with a painless episode of GI bleeding today while at work.  Patient had been out of his pancreatitic enzymes for weeks now due to cost and could feel that his pancreatitis was starting to flare.  He denies ever having GI bleeding except for after his colonoscopy in 2021 when he had post-polypectomy bleeding requiring seven hemostatic clips.    In the ER, patient got a CT scan of his abdomen that unfortunately his contrast leaked during that time.  Arm soft, NT and starting to feel better already.  Hgb dropped 2 grams since January.  CT scan was suggestive of moderate non-complicated pancreatitis.  ER PA called for [facility] admission.  ER PA to consult GI for GI bleed.  Per ER PA there were no signs of bleeding/external hemorrhoids on rectal exam.    Review of Systems: As mentioned in the history of present illness. All other systems reviewed and are negative. Past Medical History:  Diagnosis Date   Atrophy of right kidney    Chronic bronchitis (HCC)    Chronic lower back pain    Chronic pancreatitis ([place])    Gastroparesis 06/2020   confirmed by emptying study at [facility]   GERD (gastroesophageal reflux disease)    Headache    "once/month" (05/13/2017)   High cholesterol    Hypertension    Migraine    "a couple/year" (05/13/2017)   Nephrolithiasis 05/13/2017   Pancreatitis    Pneumonia ~ 09/2015   Presence of pancreatic duct stent    Recurrent acute pancreatitis    Shortness of breath 05/22/2021   Past Surgical History:  Procedure  Laterality Date   BIOPSY  11/02/2020   Procedure: BIOPSY;  Surgeon: Irving Copas., MD;  Location: [facility];  Service: Gastroenterology;;   CHOLECYSTECTOMY N/A 10/23/2017   Procedure: LAPAROSCOPIC CHOLECYSTECTOMY WITH INTRAOPERATIVE CHOLANGIOGRAM;  Surgeon: Johnathan Hausen, MD;  Location: [facility];  Service: General;  Laterality: N/A;   COLONOSCOPY WITH PROPOFOL N/A 11/02/2019   Procedure: COLONOSCOPY WITH PROPOFOL;  Surgeon: Irene Shipper, MD;  Location: [facility] ENDOSCOPY;  Service: Endoscopy;  Laterality: N/A;   ERCP N/A 11/02/2020   Procedure: ENDOSCOPIC RETROGRADE CHOLANGIOPANCREATOGRAPHY (ERCP);  Surgeon: Irving Copas., MD;  Location: [facility];  Service: Gastroenterology;  Laterality: N/A;   ESOPHAGOGASTRODUODENOSCOPY N/A 11/02/2020   Procedure: ESOPHAGOGASTRODUODENOSCOPY (EGD);  Surgeon: Irving Copas., MD;  Location: [facility];  Service: Gastroenterology;  Laterality: N/A;   ESOPHAGOGASTRODUODENOSCOPY (EGD) WITH PROPOFOL N/A 11/06/2019   Procedure: ESOPHAGOGASTRODUODENOSCOPY (EGD) WITH PROPOFOL;  Surgeon: Irene Shipper, MD;  [facility];  Service: Endoscopy;  Laterality: N/A;   HEMOSTASIS CLIP PLACEMENT  11/02/2019   Procedure: HEMOSTASIS CLIP PLACEMENT;  Surgeon: Irene Shipper, MD;  Location: [facility] ENDOSCOPY;  Service: Endoscopy;;   NO PAST SURGERIES     REMOVAL OF STONES  11/02/2020   Procedure: REMOVAL OF STONES;  Surgeon: Irving Copas., MD;  Location: [facility];  Service: Gastroenterology;;   Lavell Islam REMOVAL  11/02/2020   Procedure: STENT REMOVAL;  Surgeon: Irving Copas., MD;  Location: [facility] ENDOSCOPY;  Service: Gastroenterology;;   Social History:  reports that he has never smoked. He has never used smokeless tobacco. He reports that he does not currently use alcohol. He reports that he does not use drugs.  Allergies  Allergen Reactions   Diclofenac Hives   Norvasc [Amlodipine Besylate] Other (See Comments)    Fluid buildup in  chest   Omeprazole Other (See Comments)    MD stopped due to pancreatitis   Prednisone Other (See Comments)    Mood swings    Family History  Problem Relation Age of Onset   Hypertension Other    Hypertension Mother    Hypertension Father    Kidney disease Father    Hypertension Sister    Diabetes Sister    Hypertension Brother    Pancreatic cancer Paternal Grandmother    Colon cancer Cousin    Stomach cancer Neg Hx    Esophageal cancer Neg Hx     Prior to Admission medications   Medication Sig Start Date End Date Taking? Authorizing Provider  acetaminophen (TYLENOL) 325 MG tablet Take 2 tablets (650 mg total) by mouth every 6 (six) hours as needed for mild pain (or Fever >/= 101). Patient taking differently: Take 650 mg by mouth every 6 (six) hours as needed for mild pain. 04/26/21  Yes Idamae Schuller, MD  famotidine (PEPCID) 20 MG tablet Take 1 tablet (20 mg total) by mouth 2 (two) times daily. Instructions: Take Pepcid 20 mg twice daily x30 days, then take Pepcid 20 mg daily thereafter. Patient taking differently: Take 20 mg by mouth daily. 11/03/20 08/19/21 Yes Hall, Lorenda Cahill, DO  feeding supplement (ENSURE ENLIVE / ENSURE PLUS) LIQD Take 237 mLs by mouth 2 (two) times daily between meals. 02/04/21  Yes Dahal, Marlowe Aschoff, MD  Naphazoline-Pheniramine (VISINE-A OP) Place 1 drop into both eyes daily as needed (red eyes).   Yes [provider]  promethazine (PHENERGAN) 25 MG tablet Take 1 tablet (25 mg total) by mouth every 6 (six) hours as needed for nausea or vomiting. 04/26/21 08/19/21 Yes Idamae Schuller, MD  telmisartan (MICARDIS) 40 MG tablet Take 1 tablet (40 mg total) by mouth daily. 04/26/21 08/18/21 Yes Idamae Schuller, MD  lipase/protease/amylase (CREON) 36000 UNITS CPEP capsule Take 2 capsules (72,000 Units total) by mouth 3 (three) times daily with meals. Patient not taking: Reported on 05/22/2021 12/31/20   Barb Merino, MD    Physical Exam: Vitals:   06/21/21 1054 06/21/21  1420 06/21/21 1530  BP: (!) 180/118 (!) 147/102 (!) 171/105  Pulse: 97 75 72  Resp: '18 18 14  '$ Temp: 98.3 F (36.8 C)    TempSrc: Oral    SpO2: 98% 100% 97%    General: Appearance:    Well developed, well nourished male in no acute distress     Lungs:      respirations unlabored  Heart:    Normal heart rate. Normal rhythm. No murmurs, rubs, or gallops.  +Bs, tender to palpation  MS:   All extremities are intact.    Neurologic:   Awake, alert, oriented x 3. No apparent focal neurological           defect.     Data Reviewed: Lipase elevated at 190 CT scan done (of note this is >5 CT scan in a year period)   Assessment and Plan: * GI bleed -seen in picture a large amount of BRB in toilet -Hgb dropped 2 grams since January -GI consulted in ER ([facility])- last colonoscopy in 2021 (had a post-polypectemy GI bleed)  -colonoscopy showed: Diverticulosis in the sigmoid colon, in the ascending colon and in  the cecum and  Non-bleeding internal hemorrhoids -denies NSAIDs or ASA use -clears now and NPO after midnight  Intravenous infiltration -contrast extravasation  -seen by Brynda Greathouse: Recommend cool compress (can alternate with warm compresses as needed) to site and arm elevation as much as possible over next 24 hrs to promote adequate venous drainage.     Acute posthemorrhagic anemia -CBC q 12 -see above re: GI bleed  Acute on chronic pancreatitis ([place]) -appears mild -has been out of his pancreatic enzymes due to cost -gentle IVF once he has IV      Advance Care Planning:   Code Status: Prior full  Consults: ER PA to consult [facility] for GI bleed (I also sent non-urgent secure chat to Dr. Lorenso Courier who is the on-call GI for [facility])    Severity of Illness: The appropriate patient status for this patient is INPATIENT. Inpatient status is judged to be reasonable and necessary in order to provide the required intensity of service to ensure the patient's safety. The patient's  presenting symptoms, physical exam findings, and initial radiographic and laboratory data in the context of their chronic comorbidities is felt to place them at high risk for further clinical deterioration. Furthermore, it is not anticipated that the patient will be medically stable for discharge from the hospital within 2 midnights of admission.   * I certify that at the point of admission it is my clinical judgment that the patient will require inpatient hospital care spanning beyond 2 midnights from the point of admission due to high intensity of service, high risk for further deterioration and high frequency of surveillance required.*  Author: Geradine Girt, DO 06/21/2021 5:22 PM  For on call review [email or web address].

## 2021-06-21 NOTE — Assessment & Plan Note (Addendum)
-  contrast extravasation  ?-improved this AM ?-seen by IR ?? ?

## 2021-06-21 NOTE — Assessment & Plan Note (Addendum)
-  appears self-limiting ?-Hgb stable  ?-no further bleeding ?-prob diverticular ?-GI consult appreciated- colonoscopy if re-bleeds ?

## 2021-06-22 ENCOUNTER — Encounter (HOSPITAL_COMMUNITY): Payer: Self-pay | Admitting: Internal Medicine

## 2021-06-22 ENCOUNTER — Other Ambulatory Visit: Payer: Self-pay

## 2021-06-22 ENCOUNTER — Other Ambulatory Visit (HOSPITAL_COMMUNITY): Payer: Self-pay

## 2021-06-22 DIAGNOSIS — K922 Gastrointestinal hemorrhage, unspecified: Secondary | ICD-10-CM

## 2021-06-22 DIAGNOSIS — K8500 Idiopathic acute pancreatitis without necrosis or infection: Secondary | ICD-10-CM

## 2021-06-22 LAB — COMPREHENSIVE METABOLIC PANEL
ALT: 17 U/L (ref 0–44)
AST: 31 U/L (ref 15–41)
Albumin: 3.6 g/dL (ref 3.5–5.0)
Alkaline Phosphatase: 53 U/L (ref 38–126)
Anion gap: 13 (ref 5–15)
BUN: 8 mg/dL (ref 6–20)
CO2: 20 mmol/L — ABNORMAL LOW (ref 22–32)
Calcium: 8.8 mg/dL — ABNORMAL LOW (ref 8.9–10.3)
Chloride: 103 mmol/L (ref 98–111)
Creatinine, Ser: 1.09 mg/dL (ref 0.61–1.24)
GFR, Estimated: >60 mL/min (ref >60)
Glucose, Bld: 98 mg/dL (ref 70–99)
Potassium: 4.1 mmol/L (ref 3.5–5.1)
Sodium: 136 mmol/L (ref 135–145)
Total Bilirubin: 1.7 mg/dL — ABNORMAL HIGH (ref 0.3–1.2)
Total Protein: 7.1 g/dL (ref 6.5–8.1)

## 2021-06-22 LAB — HEMOGLOBIN: Hemoglobin: 12 g/dL — ABNORMAL LOW (ref 13.0–17.0)

## 2021-06-22 MED ORDER — HYDROMORPHONE HCL 1 MG/ML IJ SOLN
1.0000 mg | INTRAMUSCULAR | Status: DC | PRN
  Administered 2021-06-22 – 2021-06-24 (×14): 1 mg via INTRAVENOUS
  Filled 2021-06-22 (×14): qty 1

## 2021-06-22 MED ORDER — SODIUM CHLORIDE 0.9 % IV SOLN: INTRAVENOUS | Status: DC

## 2021-06-22 MED ORDER — SODIUM CHLORIDE 0.9 % IV SOLN
12.5000 mg | Freq: Four times a day (QID) | INTRAVENOUS | Status: DC | PRN
  Administered 2021-06-22 – 2021-06-23 (×4): 12.5 mg via INTRAVENOUS
  Filled 2021-06-22 (×5): qty 0.5

## 2021-06-22 NOTE — Progress Notes (Signed)
?  Evaluation after Contrast Extravasation ? ?Patient seen in follow up after contrast extrav yesterday. ? ?Exam: ?There is mild swelling at the mid bicep area.  ?There is no erythema. ?There is no discoloration. ?There are no blisters. ?There are no signs of decreased perfusion of the skin.  ?It is not warm to touch.  ?The patient has good ROM in fingers.  ?Radial pulse is normal. ? ?Will sign off. ? ? ?Murrell Redden PA-C ?06/22/2021 ?2:09 PM ? ? ?  ?

## 2021-06-22 NOTE — Progress Notes (Signed)
?PROGRESS NOTE ? ? ? ?Kyle Berry  IFO:[id number] DOB: 05/10/1970 DOA: 06/21/2021 ?PCP: [name] (Inactive)  ? ? ?Brief Narrative:  ?Kyle Berry is a 51 y.o. male with medical history significant of chronic pancreatitis who presents with a painless episode of GI bleeding today while at work.  Patient had been out of his pancreatitic enzymes for weeks now due to cost and could feel that his pancreatitis was starting to flare.  He denies ever having GI bleeding except for after his colonoscopy in 2021 when he had post-polypectomy bleeding requiring seven hemostatic clips.   ?  ?In the ER, patient got a CT scan of his abdomen that unfortunately his contrast leaked during that time.  Arm soft, NT and starting to feel better already.  Hgb dropped 2 grams since January.  CT scan was suggestive of moderate non-complicated pancreatitis.  ER PA called for [facility] admission.  ER PA to consult GI for GI bleed.  Per ER PA there were no signs of bleeding/external hemorrhoids on rectal exam.  ? ? ?Assessment and Plan: ?* GI bleed ?-appears self-limiting ?-Hgb stable ?-prob diverticular ?-GI consult appreciated ? ?Intravenous infiltration ?-contrast extravasation  ?-improved this AM ?-seen by Brynda Greathouse: Recommend cool compress (can alternate with warm compresses as needed) to site and arm elevation as much as possible over next 24 hrs to promote adequate venous drainage.  ?  ? ?Acute posthemorrhagic anemia ?-h/h stable ?-value of 10 from the ER is prob incorrect ? ?Acute on chronic pancreatitis (Kyle Berry) ?-appears mild ?-has been out of his pancreatic enzymes due to cost ?-IVF ?-pain control ? ? ? ? ? ? ? ? ? ?DVT prophylaxis: SCDs Start: 06/21/21 1848 ? ?  Code Status: Full Code ?Family Communication:  ? ?Disposition Plan:  ?Level of care: Med-Surg ?Status is: Inpatient ?Remains inpatient appropriate because: treatment of pancreatitis ?  ? ?Consultants:  ?GI ? ? ? ? ?Subjective: ?Had some increasing abdominal  pain last PM but no bleeding ? ?Objective: ?Vitals:  ? 06/21/21 2001 06/22/21 0153 06/22/21 0500 06/22/21 0735  ?BP: (!) 160/93 (!) 148/97 (!) 164/94 (!) 154/96  ?Pulse: (!) 106 93 78 75  ?Resp: '18 18 16 18  '$ ?Temp: 98.2 ?F (36.8 ?C) 98.2 ?F (36.8 ?C) 98.3 ?F (36.8 ?C) 98 ?F (36.7 ?C)  ?TempSrc: Oral Oral Oral   ?SpO2: 99% 100% 99% 98%  ? ? ?Intake/Output Summary (Last 24 hours) at 06/22/2021 1402 ?Last data filed at 06/22/2021 0129 ?Gross per 24 hour  ?Intake --  ?Output 700 ml  ?Net -700 ml  ? ?There were no vitals filed for this visit. ? ?Examination: ? ? ?General: Appearance:    Well developed, well nourished male in no acute distress  ?   ?Lungs:     Clear to auscultation bilaterally, respirations unlabored  ?Heart:    Normal heart rate. Normal rhythm. No murmurs, rubs, or gallops.  ?  ?MS:   All extremities are intact.  ?  ?Neurologic:   Awake, alert, oriented x 3. No apparent focal neurological           defect.   ?  ? ? ? ?Data Reviewed: I have personally reviewed following labs and imaging studies ? ?CBC: ?Recent Labs  ?Lab 06/21/21 ?1121 06/21/21 ?1540 06/21/21 ?1914 06/22/21 ?0915  ?WBC 8.7 6.4 7.9  --   ?NEUTROABS 5.7 4.2  --   --   ?HGB 13.0 10.4* 12.6* 12.0*  ?HCT 41.3 31.9* 36.8*  --   ?MCV  103.8* 102.2* 100.0  --   ?PLT 189 163 207  --   ? ?Basic Metabolic Panel: ?Recent Labs  ?Lab 06/21/21 ?1121 06/22/21 ?0236  ?NA 136 136  ?K 3.6 4.1  ?CL 103 103  ?CO2 20* 20*  ?GLUCOSE 105* 98  ?BUN 8 8  ?CREATININE 1.17 1.09  ?CALCIUM 9.3 8.8*  ? ?GFR: ?CrCl cannot be calculated (Unknown ideal weight.). ?Liver Function Tests: ?Recent Labs  ?Lab 06/21/21 ?1121 06/22/21 ?0236  ?AST 32 31  ?ALT 20 17  ?ALKPHOS 57 53  ?BILITOT 1.6* 1.7*  ?PROT 7.5 7.1  ?ALBUMIN 4.1 3.6  ? ?Recent Labs  ?Lab 06/21/21 ?1121  ?LIPASE 190*  ? ?No results for input(s): AMMONIA in the last 168 hours. ?Coagulation Profile: ?No results for input(s): INR, PROTIME in the last 168 hours. ?Cardiac Enzymes: ?No results for input(s): CKTOTAL, CKMB,  CKMBINDEX, TROPONINI in the last 168 hours. ?BNP (last 3 results) ?No results for input(s): PROBNP in the last 8760 hours. ?HbA1C: ?No results for input(s): HGBA1C in the last 72 hours. ?CBG: ?No results for input(s): GLUCAP in the last 168 hours. ?Lipid Profile: ?No results for input(s): CHOL, HDL, LDLCALC, TRIG, CHOLHDL, LDLDIRECT in the last 72 hours. ?Thyroid Function Tests: ?No results for input(s): TSH, T4TOTAL, FREET4, T3FREE, THYROIDAB in the last 72 hours. ?Anemia Panel: ?No results for input(s): VITAMINB12, FOLATE, FERRITIN, TIBC, IRON, RETICCTPCT in the last 72 hours. ?Sepsis Labs: ?No results for input(s): PROCALCITON, LATICACIDVEN in the last 168 hours. ? ?No results found for this or any previous visit (from the past 240 hour(s)).  ? ? ? ? ? ?Radiology Studies: ?CT ABDOMEN PELVIS W CONTRAST ? ?Result Date: 06/21/2021 ?CLINICAL DATA:  Epigastric discomfort. Rectal bleeding. History of pancreatitis. History of chronic pancreatitis. Bright red blood per rectum. EXAM: CT ABDOMEN AND PELVIS WITH CONTRAST TECHNIQUE: Multidetector CT imaging of the abdomen and pelvis was performed using the standard protocol following bolus administration of intravenous contrast. RADIATION DOSE REDUCTION: This exam was performed according to the departmental dose-optimization program which includes automated exposure control, adjustment of the mA and/or kV according to patient size and/or use of iterative reconstruction technique. CONTRAST:  135m OMNIPAQUE IOHEXOL 300 MG/ML  SOLN COMPARISON:  04/21/2021 FINDINGS: Lower chest: Clear lung bases. Normal heart size without pericardial or pleural effusion. Hepatobiliary: Normal liver. Cholecystectomy. No intra or extrahepatic biliary duct dilatation. No choledocholithiasis. Pancreas: Mild pancreatic atrophy. Parenchymal calcification in the head is likely related to chronic calcific pancreatitis. Moderate diffuse peripancreatic edema with thickening of the anterior renal fascia.  Relatively similar to 04/21/2021. No pancreatic necrosis, well-circumscribed peripancreatic fluid collection, or duct dilatation. Spleen: Normal in size, without focal abnormality. Adrenals/Urinary Tract: Normal adrenal glands. Marked right renal atrophy. Lower pole left renal collecting system stones of up to 4 mm. No hydronephrosis. Normal urinary bladder. Stomach/Bowel: Gastric antral underdistention. Normal colon, appendix, and terminal ileum. Normal small bowel. Vascular/Lymphatic: No splenic artery aneurysm. Normal aortic caliber. Patent portal and splenic veins. 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 Similar. No abdominopelvic adenopathy. Reproductive: Normal prostate. Other: No significant free fluid. Musculoskeletal: Degenerate disc disease and a disc bulge at the lumbosacral junction. IMPRESSION: 1. Moderate non complicated pancreatitis. 2. Right renal atrophy. 3. Left nephrolithiasis. Electronically Signed   By: KAbigail MiyamotoM.D.   On: 06/21/2021 16:28   ? ? ? ? ? ?Scheduled Meds: ? lipase/protease/amylase  72,000 Units Oral TID WC  ? pantoprazole (PROTONIX) IV  40 mg Intravenous Q12H  ? ?Continuous  Infusions: ? sodium chloride 150 mL/hr at 06/22/21 1039  ? promethazine (PHENERGAN) injection (IM or IVPB)    ? ? ? LOS: 1 day  ? ? ?Time spent: 55 minutes spent on chart review, discussion with nursing staff, consultants, updating family and interview/physical exam; more than 50% of that time was spent in counseling and/or coordination of care. ? ? ? ?Geradine Girt, DO ?[facility] ?Available via Epic secure chat 7am-7pm ?After these hours, please refer to coverage provider listed on [email or web address] ?06/22/2021, 2:02 PM   ?

## 2021-06-22 NOTE — Hospital Course (Addendum)
Kyle Berry is a 51 y.o. male with medical history significant of chronic pancreatitis who presents with a painless episode of GI bleeding today while at work.  Patient had been out of his pancreatitic enzymes for weeks now due to cost and could feel that his pancreatitis was starting to flare.  He denies ever having GI bleeding except for after his colonoscopy in 2021 when he had post-polypectomy bleeding requiring seven hemostatic clips.   ?No further bleeding episodes and slowly getting better in regards to his pancreatitis.  Needs creon to stay out of the hospital. ?

## 2021-06-22 NOTE — Consult Note (Signed)
Consultation  Referring Provider:  DO Vann Primary Care Physician:  [name] (Inactive) Primary Gastroenterologist:   [facility] (previously [place])      Reason for Consultation:   GI Bleed           HPI:   Kyle Berry is a 51 y.o. male with a past medical history as listed below including chronic pancreatitis for which he follows with [facility] as well as GERD and multiple others, who presented to the hospital on 06/21/2021 with a painless episode of GI bleeding while at work.    Today, patient explains that he has been out of his pancreatic enzymes for 2 months now due to cost and has felt like his pancreatitis has tried to flare a few different times, he has been managing this with a decrease in diet at home, but about 2 days ago he had severe increase in epigastric pain and thought "it is probably time for me to go to the hospital".  Tells me when he is taking the Creon he does feel a large difference but was told that his portion will be $2000 for a month supply when he tried to get this last.      Along with this typical pancreatic pain which he experiences here and there he also had a very large bright red bloody bowel movement yesterday morning around 930 when he was at work.  Tells me since then he has had no further movement at all.  He had been having stools that were radiating back and forth from diarrhea to constipation but denies any straining.  He does do a lot of heavy lifting at his job.    Denies fever, chills, weight loss or vomiting.  ER course: CT scan suggestive of moderate noncomplicated pancreatitis, rectal exam in the ER with no signs of bleeding/external hemorrhoids, hemoglobin 13 on 06/21/2021--> 10.4 at 3:40 PM--> 12.6 at 7:14 PM (11.6 on 04/25/2021), BUN normal  GI history: 11/02/2019 colonoscopy done for suspected post polypectomy bleed with findings of post polypectomy bleed in the cecum treated with clipping due to the  presence of stigmata, also clipping of large sigmoid colon polypectomy site, no active bleeding at time of procedure, diverticulosis, repeat recommended in 3 years  10/30/2019 colonoscopy with fair preparation of the colon, 120 mm polyp in the cecum found to be a tubular adenoma, one 5 mm polyp in the transverse colon, 1 less than 1 mm polyp in the transverse colon , 30 mm polyp at the rectosigmoid colon with diverticulosis in the sigmoid, ascending and cecum as well as nonbleeding internal hemorrhoids; pathology showed tubular adenomas.  Repeat recommended in 3 years  Past Medical History:  Diagnosis Date   Atrophy of right kidney    Chronic bronchitis (HCC)    Chronic lower back pain    Chronic pancreatitis ([place])    Gastroparesis 06/2020   confirmed by emptying study at [facility]   GERD (gastroesophageal reflux disease)    Headache    "once/month" (05/13/2017)   High cholesterol    Hypertension    Migraine    "a couple/year" (05/13/2017)   Nephrolithiasis 05/13/2017   Pancreatitis    Pneumonia ~ 09/2015   Presence of pancreatic duct stent    Recurrent acute pancreatitis    Shortness of breath 05/22/2021    Past Surgical History:  Procedure Laterality Date   BIOPSY  11/02/2020   Procedure: BIOPSY;  Surgeon: Irving Copas., MD;  Location: [place];  Service: Gastroenterology;;   CHOLECYSTECTOMY N/A 10/23/2017   Procedure: LAPAROSCOPIC CHOLECYSTECTOMY WITH INTRAOPERATIVE CHOLANGIOGRAM;  Surgeon: Johnathan Hausen, MD;  Location: [facility];  Service: General;  Laterality: N/A;   COLONOSCOPY WITH PROPOFOL N/A 11/02/2019   Procedure: COLONOSCOPY WITH PROPOFOL;  Surgeon: Irene Shipper, MD;  Location: [facility] ENDOSCOPY;  Service: Endoscopy;  Laterality: N/A;   ERCP N/A 11/02/2020   Procedure: ENDOSCOPIC RETROGRADE CHOLANGIOPANCREATOGRAPHY (ERCP);  Surgeon: Irving Copas., MD;  Location: [place];  Service: Gastroenterology;  Laterality: N/A;    ESOPHAGOGASTRODUODENOSCOPY N/A 11/02/2020   Procedure: ESOPHAGOGASTRODUODENOSCOPY (EGD);  Surgeon: Irving Copas., MD;  Location: [place];  Service: Gastroenterology;  Laterality: N/A;   ESOPHAGOGASTRODUODENOSCOPY (EGD) WITH PROPOFOL N/A 11/06/2019   Procedure: ESOPHAGOGASTRODUODENOSCOPY (EGD) WITH PROPOFOL;  Surgeon: Irene Shipper, MD;  Location: [facility] ENDOSCOPY;  Service: Endoscopy;  Laterality: N/A;   HEMOSTASIS CLIP PLACEMENT  11/02/2019   Procedure: HEMOSTASIS CLIP PLACEMENT;  Surgeon: Irene Shipper, MD;  Location: [facility] ENDOSCOPY;  Service: Endoscopy;;   NO PAST SURGERIES     REMOVAL OF STONES  11/02/2020   Procedure: REMOVAL OF STONES;  Surgeon: Rush Landmark Telford Nab., MD;  Location: [place];  Service: Gastroenterology;;   Lavell Islam REMOVAL  11/02/2020   Procedure: STENT REMOVAL;  Surgeon: Irving Copas., MD;  Location: [facility] ENDOSCOPY;  Service: Gastroenterology;;    Family History  Problem Relation Age of Onset   Hypertension Other    Hypertension Mother    Hypertension Father    Kidney disease Father    Hypertension Sister    Diabetes Sister    Hypertension Brother    Pancreatic cancer Paternal Grandmother    Colon cancer Cousin    Stomach cancer Neg Hx    Esophageal cancer Neg Hx     Social History   Tobacco Use   Smoking status: Never   Smokeless tobacco: Never  Vaping Use   Vaping Use: Never used  Substance Use Topics   Alcohol use: Not Currently    Comment: remote h/o heavy use   Drug use: No    Prior to Admission medications   Medication Sig Start Date End Date Taking? Authorizing Provider  acetaminophen (TYLENOL) 325 MG tablet Take 2 tablets (650 mg total) by mouth every 6 (six) hours as needed for mild pain (or Fever >/= 101). Patient taking differently: Take 650 mg by mouth every 6 (six) hours as needed for mild pain. 04/26/21  Yes Idamae Schuller, MD  famotidine (PEPCID) 20 MG tablet Take 1 tablet (20 mg total) by mouth 2  (two) times daily. Instructions: Take Pepcid 20 mg twice daily x30 days, then take Pepcid 20 mg daily thereafter. Patient taking differently: Take 20 mg by mouth daily. 11/03/20 08/19/21 Yes Hall, Lorenda Cahill, DO  feeding supplement (ENSURE ENLIVE / ENSURE PLUS) LIQD Take 237 mLs by mouth 2 (two) times daily between meals. 02/04/21  Yes Dahal, Marlowe Aschoff, MD  Naphazoline-Pheniramine (VISINE-A OP) Place 1 drop into both eyes daily as needed (red eyes).   Yes [provider]  promethazine (PHENERGAN) 25 MG tablet Take 1 tablet (25 mg total) by mouth every 6 (six) hours as needed for nausea or vomiting. 04/26/21 08/19/21 Yes Idamae Schuller, MD  telmisartan (MICARDIS) 40 MG tablet Take 1 tablet (40 mg total) by mouth daily. 04/26/21 08/18/21 Yes Idamae Schuller, MD  lipase/protease/amylase (CREON) 36000 UNITS CPEP capsule Take 2 capsules (72,000 Units total) by mouth 3 (three) times daily with meals. Patient not taking: Reported on 05/22/2021 12/31/20  Barb Merino, MD    Current Facility-Administered Medications  Medication Dose Route Frequency Provider Last Rate Last Admin   acetaminophen (TYLENOL) tablet 650 mg  650 mg Oral Q6H PRN Eulogio Bear U, DO       Or   acetaminophen (TYLENOL) suppository 650 mg  650 mg Rectal Q6H PRN Eulogio Bear U, DO       hydrALAZINE (APRESOLINE) injection 5 mg  5 mg Intravenous Q8H PRN Eulogio Bear U, DO   5 mg at 06/21/21 1831   HYDROmorphone (DILAUDID) injection 1 mg  1 mg Intravenous Q3H PRN Kathryne Eriksson, NP   1 mg at 06/22/21 2505   lipase/protease/amylase (CREON) capsule 72,000 Units  72,000 Units Oral TID WC Eulogio Bear U, DO   72,000 Units at 06/21/21 1856   ondansetron (ZOFRAN) tablet 4 mg  4 mg Oral Q6H PRN Geradine Girt, DO       Or   ondansetron [facility]) injection 4 mg  4 mg Intravenous Q6H PRN Eulogio Bear U, DO   4 mg at 06/22/21 0028   pantoprazole (PROTONIX) injection 40 mg  40 mg Intravenous Q12H Eulogio Bear U, DO   40 mg at 06/22/21 3976     Allergies as of 06/21/2021 - Review Complete 06/21/2021  Allergen Reaction Noted   Diclofenac Hives 12/14/2013   Norvasc [amlodipine besylate] Other (See Comments) 08/15/2016   Omeprazole Other (See Comments) 08/15/2016   Prednisone Other (See Comments) 12/14/2013     Review of Systems:    Constitutional: No weight loss, fever or chills Skin: No rash  Cardiovascular: No chest pain Respiratory: No SOB  Gastrointestinal: See HPI and otherwise negative Genitourinary: No dysuria Neurological: No headache, dizziness or syncope Musculoskeletal: No new muscle or joint pain Hematologic: No bruising Psychiatric: No history of depression or anxiety    Physical Exam:  Vital signs in last 24 hours: Temp:  [97.5 F (36.4 C)-98.5 F (36.9 C)] 98 F (36.7 C) (03/09 0735) Pulse Rate:  [67-106] 75 (03/09 0735) Resp:  [14-22] 18 (03/09 0735) BP: (147-186)/(93-118) 154/96 (03/09 0735) SpO2:  [95 %-100 %] 98 % (03/09 0735)   General:   Pleasant AA male appears to be in NAD, Well developed, Well nourished, alert and cooperative Head:  Normocephalic and atraumatic. Eyes:   PEERL, EOMI. No icterus. Conjunctiva pink. Ears:  Normal auditory acuity. Neck:  Supple Throat: Oral cavity and pharynx without inflammation, swelling or lesion. Lungs: Respirations even and unlabored. Lungs clear to auscultation bilaterally.   No wheezes, crackles, or rhonchi.  Heart: Normal S1, S2. No MRG. Regular rate and rhythm. No peripheral edema, cyanosis or pallor.  Abdomen:  Soft, nondistended, moderate epigastric ttp, No rebound or guarding. Normal bowel sounds. No appreciable masses or hepatomegaly. Rectal:  Not performed.  Msk:  Symmetrical without gross deformities. Peripheral pulses intact.  Extremities:  Without edema, no deformity or joint abnormality.  Neurologic:  Alert and  oriented x4;  grossly normal neurologically. Skin:   Dry and intact without significant lesions or rashes. Psychiatric:  Demonstrates good judgement and reason without abnormal affect or behaviors.   LAB RESULTS: Recent Labs    06/21/21 1121 06/21/21 1540 06/21/21 1914  WBC 8.7 6.4 7.9  HGB 13.0 10.4* 12.6*  HCT 41.3 31.9* 36.8*  PLT 189 163 207   BMET Recent Labs    06/21/21 1121 06/22/21 0236  NA 136 136  K 3.6 4.1  CL 103 103  CO2 20* 20*  GLUCOSE 105* 98  BUN  8 8  CREATININE 1.17 1.09  CALCIUM 9.3 8.8*   LFT Recent Labs    06/22/21 0236  PROT 7.1  ALBUMIN 3.6  AST 31  ALT 17  ALKPHOS 53  BILITOT 1.7*    STUDIES: CT ABDOMEN PELVIS W CONTRAST  Result Date: 06/21/2021 CLINICAL DATA:  Epigastric discomfort. Rectal bleeding. History of pancreatitis. History of chronic pancreatitis. Bright red blood per rectum. EXAM: CT ABDOMEN AND PELVIS WITH CONTRAST TECHNIQUE: Multidetector CT imaging of the abdomen and pelvis was performed using the standard protocol following bolus administration of intravenous contrast. RADIATION DOSE REDUCTION: This exam was performed according to the departmental dose-optimization program which includes automated exposure control, adjustment of the mA and/or kV according to patient size and/or use of iterative reconstruction technique. CONTRAST:  190m OMNIPAQUE IOHEXOL 300 MG/ML  SOLN COMPARISON:  04/21/2021 FINDINGS: Lower chest: Clear lung bases. Normal heart size without pericardial or pleural effusion. Hepatobiliary: Normal liver. Cholecystectomy. No intra or extrahepatic biliary duct dilatation. No choledocholithiasis. Pancreas: Mild pancreatic atrophy. Parenchymal calcification in the head is likely related to chronic calcific pancreatitis. Moderate diffuse peripancreatic edema with thickening of the anterior renal fascia. Relatively similar to 04/21/2021. No pancreatic necrosis, well-circumscribed peripancreatic fluid collection, or duct dilatation. Spleen: Normal in size, without focal abnormality. Adrenals/Urinary Tract: Normal adrenal glands. Marked right  renal atrophy. Lower pole left renal collecting system stones of up to 4 mm. No hydronephrosis. Normal urinary bladder. Stomach/Bowel: Gastric antral underdistention. Normal colon, appendix, and terminal ileum. Normal small bowel. Vascular/Lymphatic: No splenic artery aneurysm. Normal aortic caliber. Patent portal and splenic veins. 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 Similar. No abdominopelvic adenopathy. Reproductive: Normal prostate. Other: No significant free fluid. Musculoskeletal: Degenerate disc disease and a disc bulge at the lumbosacral junction. IMPRESSION: 1. Moderate non complicated pancreatitis. 2. Right renal atrophy. 3. Left nephrolithiasis. Electronically Signed   By: KAbigail MiyamotoM.D.   On: 06/21/2021 16:28      Impression / Plan:   Impression: 1.  GI bleed: 1 episode of painless hematochezia around 9:30 a.m. on 06/21/2021, none since, hemoglobin initially showing a two-point drop, hemoglobin 12.6--> 10.4--> 13 (likely 1 of those values was lab error), recent colonoscopy in 2021 with polyps and diverticula and repeat recommended in 3 years; likely diverticular 2.  Acute on chronic pancreatitis: [facility]  Plan: 1.  Discussed with patient likely jhis bleeding was from diverticula, it appears his hemoglobin has returned back to normal, discussed that one of the values may have been a little off making uKoreathink that he had lost more blood than he really did. 2.  In regards to his acute on chronic pancreatitis, patient has been through this many times and is feeling some better with fluids and pain control.  I have restarted his clear liquid diet. 3.  We will need to try and get patient's Creon ordered for him as an outpatient somehow at lower cost.  This does seem to keep him out of the hospital for longer periods of time.  Thank you for your kind consultation, we will continue to  follow.  JLavone NianLAscension Borgess Hospital 06/22/2021, 8:44 AM

## 2021-06-22 NOTE — TOC Progression Note (Signed)
Transition of Care (TOC) - Progression Note  ? ? ?Patient Details  ?Name: [name] ?MRN: [id number] ?Date of Birth: Mar 01, 1971 ? ?Transition of Care (TOC) CM/SW Contact  ?Marilu Favre, RN ?Phone Number: ?06/22/2021, 3:19 PM ? ?Clinical Narrative:    ? ?Consult for pancreatitic enzymes.  ? ?Documented that [name] CPhT on 04-27-21 completed and sent [facility] Assist Patient Assistance application for Creon. ? ?[name] CPhT followed up on same. Patient was denied assistance with [employer]. Per Child psychotherapist CPht patient's preferred medication is Pancreaze. There is a copay card available, however it only covers $100.00. Patient's copay is $1700.00 due to an unmet deductible. ? ?  ?  ? ?Expected Discharge Plan and Services ?  ?  ?  ?  ?  ?                ?  ?  ?  ?  ?  ?  ?  ?  ?  ?  ? ? ?Social Determinants of Health (SDOH) Interventions ?  ? ?Readmission Risk Interventions ?Readmission Risk Prevention Plan 11/03/2020  ?Transportation Screening Complete  ?PCP or Specialist Appt within 3-5 Days Complete  ?[place] or Home Care Consult Complete  ?Social Work Consult for [place] Planning/Counseling Complete  ?Palliative Care Screening Not Applicable  ?Some recent data might be hidden  ? ? ?

## 2021-06-23 ENCOUNTER — Telehealth: Payer: Self-pay

## 2021-06-23 LAB — CBC
HCT: 32.5 % — ABNORMAL LOW (ref 39.0–52.0)
Hemoglobin: 10.9 g/dL — ABNORMAL LOW (ref 13.0–17.0)
MCH: 34.1 pg — ABNORMAL HIGH (ref 26.0–34.0)
MCHC: 33.5 g/dL (ref 30.0–36.0)
MCV: 101.6 fL — ABNORMAL HIGH (ref 80.0–100.0)
Platelets: 170 10*3/uL (ref 150–400)
RBC: 3.2 MIL/uL — ABNORMAL LOW (ref 4.22–5.81)
RDW: 15 % (ref 11.5–15.5)
WBC: 5.5 10*3/uL (ref 4.0–10.5)
nRBC: 0.4 % — ABNORMAL HIGH (ref 0.0–0.2)

## 2021-06-23 LAB — BASIC METABOLIC PANEL
Anion gap: 6 (ref 5–15)
BUN: 5 mg/dL — ABNORMAL LOW (ref 6–20)
CO2: 26 mmol/L (ref 22–32)
Calcium: 8.4 mg/dL — ABNORMAL LOW (ref 8.9–10.3)
Chloride: 104 mmol/L (ref 98–111)
Creatinine, Ser: 1.03 mg/dL (ref 0.61–1.24)
GFR, Estimated: >60 mL/min (ref >60)
Glucose, Bld: 105 mg/dL — ABNORMAL HIGH (ref 70–99)
Potassium: 3.8 mmol/L (ref 3.5–5.1)
Sodium: 136 mmol/L (ref 135–145)

## 2021-06-23 MED ORDER — OXYCODONE-ACETAMINOPHEN 5-325 MG PO TABS
1.0000 | ORAL_TABLET | Freq: Four times a day (QID) | ORAL | Status: DC | PRN
  Administered 2021-06-23: 1 via ORAL
  Filled 2021-06-23: qty 1

## 2021-06-23 MED ORDER — OXYCODONE HCL 5 MG PO TABS: 5.0000 mg | ORAL_TABLET | Freq: Four times a day (QID) | ORAL | Status: DC | PRN

## 2021-06-23 NOTE — Telephone Encounter (Signed)
-----   Message from Levin Erp, [place] sent at 06/23/2021  9:41 AM EST ----- ?Regarding: Can we look into getting Creon for this patient? ?Can you look into getting Creon for this patient?  He tells me that his portion would be $2000 a month.  He would really benefit from it as he has acute on chronic pancreatitis related to the deformity in his pancreas.  This would hopefully keep him out of the hospital. ? ?Thanks, JL L ? ?

## 2021-06-23 NOTE — Progress Notes (Signed)
? ? Progress Note ? ? 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 ?Patient tells me that he has had no further bleeding he did have a bowel movement this morning.  He continues with epigastric pain consistent with his acute on chronic pancreatitis.  He tells me he tolerated his clear liquid diet okay but is still using Dilaudid every 3 hours.  He would like to go to full liquids. ? ? Objective  ? ?Vital signs in last 24 hours: ?Temp:  [97.7 ?F (36.5 ?C)-98.5 ?F (36.9 ?C)] 98.1 ?F (36.7 ?C) (03/10 3818) ?Pulse Rate:  [69-80] 73 (03/10 0752) ?Resp:  [17-18] 17 (03/10 0752) ?BP: (145-152)/(92-100) 145/95 (03/10 0752) ?SpO2:  [97 %-100 %] 97 % (03/10 0752) ?Weight:  [75.8 kg] 75.8 kg (03/09 1538) ?Last BM Date : 06/21/21 ?General:    AA male in NAD ?Heart:  Regular rate and rhythm; no murmurs ?Lungs: Respirations even and unlabored, lungs CTA bilaterally ?Abdomen:  Soft, moderate epigastric ttp and nondistended. Normal bowel sounds. ?Psych:  Cooperative. Normal mood and affect. ? ?Intake/Output from previous day: ?03/09 0701 - 03/10 0700 ?In: 3792.7 [P.O.:880; I.V.:2812.7; IV Piggyback:100] ?Out: -  ?Intake/Output this shift: ?Total I/O ?In: 480 [P.O.:480] ?Out: -  ? ?Lab Results: ?Recent Labs  ?  06/21/21 ?1540 06/21/21 ?1914 06/22/21 ?0915 06/23/21 ?0224  ?WBC 6.4 7.9  --  5.5  ?HGB 10.4* 12.6* 12.0* 10.9*  ?HCT 31.9* 36.8*  --  32.5*  ?PLT 163 207  --  170  ? ?BMET ?Recent Labs  ?  06/21/21 ?1121 06/22/21 ?0236 06/23/21 ?2993  ?NA 136 136 136  ?K 3.6 4.1 3.8  ?CL 103 103 104  ?CO2 20* 20* 26  ?GLUCOSE 105* 98 105*  ?BUN 8 8 5*  ?CREATININE 1.17 1.09 1.03  ?CALCIUM 9.3 8.8* 8.4*  ? ?LFT ?Recent Labs  ?  06/22/21 ?0236  ?PROT 7.1  ?ALBUMIN 3.6  ?AST 31  ?ALT 17  ?ALKPHOS 53  ?BILITOT 1.7*  ? ? ?Studies/Results: ?CT ABDOMEN PELVIS W CONTRAST ? ?Result Date: 06/21/2021 ?CLINICAL DATA:  Epigastric discomfort. Rectal  bleeding. History of pancreatitis. History of chronic pancreatitis. Bright red blood per rectum. EXAM: CT ABDOMEN AND PELVIS WITH CONTRAST TECHNIQUE: Multidetector CT imaging of the abdomen and pelvis was performed using the standard protocol following bolus administration of intravenous contrast. RADIATION DOSE REDUCTION: This exam was performed according to the departmental dose-optimization program which includes automated exposure control, adjustment of the mA and/or kV according to patient size and/or use of iterative reconstruction technique. CONTRAST:  116m OMNIPAQUE IOHEXOL 300 MG/ML  SOLN COMPARISON:  04/21/2021 FINDINGS: Lower chest: Clear lung bases. Normal heart size without pericardial or pleural effusion. Hepatobiliary: Normal liver. Cholecystectomy. No intra or extrahepatic biliary duct dilatation. No choledocholithiasis. Pancreas: Mild pancreatic atrophy. Parenchymal calcification in the head is likely related to chronic calcific pancreatitis. Moderate diffuse peripancreatic edema with thickening of the anterior renal fascia. Relatively similar to 04/21/2021. No pancreatic necrosis, well-circumscribed peripancreatic fluid collection, or duct dilatation. Spleen: Normal in size, without focal abnormality. Adrenals/Urinary Tract: Normal adrenal glands. Marked right renal atrophy. Lower pole left renal collecting system stones of up to 4 mm. No hydronephrosis. Normal urinary bladder. Stomach/Bowel: Gastric antral underdistention. Normal colon, appendix, and terminal ileum. Normal small bowel. Vascular/Lymphatic: No splenic artery aneurysm. Normal aortic caliber. Patent portal and splenic veins. 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 Similar. No abdominopelvic adenopathy. Reproductive:  Normal prostate. Other: No significant free fluid. Musculoskeletal: Degenerate disc disease and a disc bulge at the lumbosacral junction. IMPRESSION: 1. Moderate non  complicated pancreatitis. 2. Right renal atrophy. 3. Left nephrolithiasis. Electronically Signed   By: Abigail Miyamoto M.D.   On: 06/21/2021 16:28   ? ? Assessment / Plan:   ?Assessment: ?1.  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  Acute on chronic pancreatitis: Seems to be improving slowly, typically follows with [facility] for this outpatient ? ?Plan: ?1.  Patient has had no further hematochezia and did have a normal bowel movement this morning, hemoglobin stable. ?2.  Continue to monitor hemoglobin with transfusion as needed if he has further bleeding.  Again as per Dr. Tarri Glenn would need to consider colonoscopy at that time given his history of large adenomatous polyps. ?3.  Patient is feeling slightly better from his pancreatitis pain and requests a full liquid diet today. ?4.  Continue fluids and pain control ?5.  I did contact my nurse in regards to hopefully helping him get his Creon cheaper as an outpatient, I believe this would help him not have to come back to the hospital as much ? ?Thank you for your kind consultation, we will continue to follow ? ? LOS: 2 days  ? ?Lavone Nian [facility]  06/23/2021, 9:33 AM ? ?  ?

## 2021-06-23 NOTE — Progress Notes (Signed)
?PROGRESS NOTE ? ? ? ?Kyle Berry  WGN:[id number] DOB: 03-Feb-1971 DOA: 06/21/2021 ?PCP: [name] (Inactive)  ? ? ?Brief Narrative:  ?Kyle Berry is a 51 y.o. male with medical history significant of chronic pancreatitis who presents with a painless episode of GI bleeding today while at work.  Patient had been out of his pancreatitic enzymes for weeks now due to cost and could feel that his pancreatitis was starting to flare.  He denies ever having GI bleeding except for after his colonoscopy in 2021 when he had post-polypectomy bleeding requiring seven hemostatic clips.   ?No further bleeding episodes and slowly getting better in regards to his pancreatitis.  Needs creon to stay out of the hospital.  ? ? ?Assessment and Plan: ?* GI bleed ?-appears self-limiting ?-Hgb stable (? If 12 was hemo concentrated fro not getting IVF while missing an IV) ?-no further bleeding ?-prob diverticular ?-GI consult appreciated ? ?Intravenous infiltration ?-contrast extravasation  ?-improved this AM ?-seen by IR ?  ? ?Acute posthemorrhagic anemia ?-h/h stable ?-was the 10 incorrect or was the 12 hemo-concentrated while not getting IVF for pancreatitis ?-no further bleeding noted ? ?Acute on chronic pancreatitis ([place]) ?-appears mild ?-has been out of his pancreatic enzymes due to cost ?-IVF ?-pain control ?-advance diet as able ? ? ? ? ? ? ? ? ? ?DVT prophylaxis: SCDs Start: 06/21/21 1848 ? ?  Code Status: Full Code ?Family Communication:  ? ?Disposition Plan:  ?Level of care: Med-Surg ?Status is: Inpatient ?Remains inpatient appropriate because: needs IVF and treatment of pancreatitis ?  ? ?Consultants:  ?GI ? ?  ? ? ?Subjective: ?Would like full liquids ? ?Objective: ?Vitals:  ? 06/22/21 1601 06/22/21 2034 06/23/21 0539 06/23/21 0752  ?BP: (!) 149/92 (!) 152/92 (!) 146/100 (!) 145/95  ?Pulse: 69 74 80 73  ?Resp: '18 18 17 17  '$ ?Temp: 97.9 ?F (36.6 ?C) 98.5 ?F (36.9 ?C) 97.7 ?F (36.5 ?C) 98.1 ?F (36.7 ?C)   ?TempSrc:  Oral Oral Axillary  ?SpO2: 100% 100% 100% 97%  ?Weight:      ?Height:      ? ? ?Intake/Output Summary (Last 24 hours) at 06/23/2021 1327 ?Last data filed at 06/23/2021 0730 ?Gross per 24 hour  ?Intake 4032.65 ml  ?Output --  ?Net 4032.65 ml  ? ?Filed Weights  ? 06/22/21 1538  ?Weight: 75.8 kg  ? ? ?Examination: ? ? ?General: Appearance:    Well developed, well nourished male in no acute distress  ?   ?Lungs:     respirations unlabored  ?Heart:    Normal heart rate. Normal rhythm. No murmurs, rubs, or gallops.  ?  ?MS:   All extremities are intact.  ?  ?Neurologic:   Awake, alert, oriented x 3. No apparent focal neurological           defect.   ?  ? ? ? ?Data Reviewed: I have personally reviewed following labs and imaging studies ? ?CBC: ?Recent Labs  ?Lab 06/21/21 ?1121 06/21/21 ?1540 06/21/21 ?1914 06/22/21 ?0915 06/23/21 ?0224  ?WBC 8.7 6.4 7.9  --  5.5  ?NEUTROABS 5.7 4.2  --   --   --   ?HGB 13.0 10.4* 12.6* 12.0* 10.9*  ?HCT 41.3 31.9* 36.8*  --  32.5*  ?MCV 103.8* 102.2* 100.0  --  101.6*  ?PLT 189 163 207  --  170  ? ?Basic Metabolic Panel: ?Recent Labs  ?Lab 06/21/21 ?1121 06/22/21 ?0236 06/23/21 ?7846  ?NA 136 136 136  ?  K 3.6 4.1 3.8  ?CL 103 103 104  ?CO2 20* 20* 26  ?GLUCOSE 105* 98 105*  ?BUN 8 8 5*  ?CREATININE 1.17 1.09 1.03  ?CALCIUM 9.3 8.8* 8.4*  ? ?GFR: ?Estimated Creatinine Clearance: 91 mL/min (by C-G formula based on SCr of 1.03 mg/dL). ?Liver Function Tests: ?Recent Labs  ?Lab 06/21/21 ?1121 06/22/21 ?0236  ?AST 32 31  ?ALT 20 17  ?ALKPHOS 57 53  ?BILITOT 1.6* 1.7*  ?PROT 7.5 7.1  ?ALBUMIN 4.1 3.6  ? ?Recent Labs  ?Lab 06/21/21 ?1121  ?LIPASE 190*  ? ?No results for input(s): AMMONIA in the last 168 hours. ?Coagulation Profile: ?No results for input(s): INR, PROTIME in the last 168 hours. ?Cardiac Enzymes: ?No results for input(s): CKTOTAL, CKMB, CKMBINDEX, TROPONINI in the last 168 hours. ?BNP (last 3 results) ?No results for input(s): PROBNP in the last 8760 hours. ?HbA1C: ?No  results for input(s): HGBA1C in the last 72 hours. ?CBG: ?No results for input(s): GLUCAP in the last 168 hours. ?Lipid Profile: ?No results for input(s): CHOL, HDL, LDLCALC, TRIG, CHOLHDL, LDLDIRECT in the last 72 hours. ?Thyroid Function Tests: ?No results for input(s): TSH, T4TOTAL, FREET4, T3FREE, THYROIDAB in the last 72 hours. ?Anemia Panel: ?No results for input(s): VITAMINB12, FOLATE, FERRITIN, TIBC, IRON, RETICCTPCT in the last 72 hours. ?Sepsis Labs: ?No results for input(s): PROCALCITON, LATICACIDVEN in the last 168 hours. ? ?No results found for this or any previous visit (from the past 240 hour(s)).  ? ? ? ? ? ?Radiology Studies: ?CT ABDOMEN PELVIS W CONTRAST ? ?Result Date: 06/21/2021 ?CLINICAL DATA:  Epigastric discomfort. Rectal bleeding. History of pancreatitis. History of chronic pancreatitis. Bright red blood per rectum. EXAM: CT ABDOMEN AND PELVIS WITH CONTRAST TECHNIQUE: Multidetector CT imaging of the abdomen and pelvis was performed using the standard protocol following bolus administration of intravenous contrast. RADIATION DOSE REDUCTION: This exam was performed according to the departmental dose-optimization program which includes automated exposure control, adjustment of the mA and/or kV according to patient size and/or use of iterative reconstruction technique. CONTRAST:  177m OMNIPAQUE IOHEXOL 300 MG/ML  SOLN COMPARISON:  04/21/2021 FINDINGS: Lower chest: Clear lung bases. Normal heart size without pericardial or pleural effusion. Hepatobiliary: Normal liver. Cholecystectomy. No intra or extrahepatic biliary duct dilatation. No choledocholithiasis. Pancreas: Mild pancreatic atrophy. Parenchymal calcification in the head is likely related to chronic calcific pancreatitis. Moderate diffuse peripancreatic edema with thickening of the anterior renal fascia. Relatively similar to 04/21/2021. No pancreatic necrosis, well-circumscribed peripancreatic fluid collection, or duct dilatation. Spleen:  Normal in size, without focal abnormality. Adrenals/Urinary Tract: Normal adrenal glands. Marked right renal atrophy. Lower pole left renal collecting system stones of up to 4 mm. No hydronephrosis. Normal urinary bladder. Stomach/Bowel: Gastric antral underdistention. Normal colon, appendix, and terminal ileum. Normal small bowel. Vascular/Lymphatic: No splenic artery aneurysm. Normal aortic caliber. Patent portal and splenic veins. 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 Similar. No abdominopelvic adenopathy. Reproductive: Normal prostate. Other: No significant free fluid. Musculoskeletal: Degenerate disc disease and a disc bulge at the lumbosacral junction. IMPRESSION: 1. Moderate non complicated pancreatitis. 2. Right renal atrophy. 3. Left nephrolithiasis. Electronically Signed   By: KAbigail MiyamotoM.D.   On: 06/21/2021 16:28   ? ? ? ? ? ?Scheduled Meds: ? lipase/protease/amylase  72,000 Units Oral TID WC  ? pantoprazole (PROTONIX) IV  40 mg Intravenous Q12H  ? ?Continuous Infusions: ? sodium chloride 150 mL/hr at 06/23/21 0704  ? promethazine (PHENERGAN) injection (IM or IVPB) 12.5  mg (06/23/21 0610)  ? ? ? LOS: 2 days  ? ? ?Time spent: 65 minutes spent on chart review, discussion with nursing staff, consultants, updating family and interview/physical exam; more than 50% of that time was spent in counseling and/or coordination of care. ? ? ? ?Geradine Girt, DO ?[facility] ?Available via Epic secure chat 7am-7pm ?After these hours, please refer to coverage provider listed on [email or web address] ?06/23/2021, 1:27 PM   ?

## 2021-06-23 NOTE — Plan of Care (Signed)

## 2021-06-23 NOTE — Telephone Encounter (Signed)
Called and spoke with patient. I informed him that I have printed the Creon [employer] assist application to hopefully get him some assistance with the cost of Creon. I told pt that I will fax the provider portion today. He knows that there are several pages that he has to complete as the patient and information that he has to provide to [place] (financial information). Pt stated that he will come by the office on Monday to pick up his portion of the application, once complete with requested documents he will return to our office for [place] to fax. Pt knows that he will pick this up from the 2nd floor receptionist desk between 7:30 am-4:30 pm.  Pt verbalized understanding and had no concerns at the end of the call. ? ?Page 2 of Creon patient assistance application has been completed and faxed to [employer] assist at [phone]. ?

## 2021-06-24 MED ORDER — PROMETHAZINE HCL 25 MG PO TABS: 25.0000 mg | ORAL_TABLET | Freq: Four times a day (QID) | ORAL | 0 refills | Status: DC | PRN

## 2021-06-24 MED ORDER — OXYCODONE-ACETAMINOPHEN 5-325 MG PO TABS
1.0000 | ORAL_TABLET | Freq: Four times a day (QID) | ORAL | 0 refills | Status: DC | PRN
Start: 2021-06-24 — End: 2021-08-12

## 2021-06-24 MED ORDER — OXYCODONE-ACETAMINOPHEN 5-325 MG PO TABS
1.0000 | ORAL_TABLET | Freq: Four times a day (QID) | ORAL | Status: DC | PRN
  Administered 2021-06-24: 2 via ORAL
  Filled 2021-06-24: qty 2

## 2021-06-24 MED ORDER — IRBESARTAN 150 MG PO TABS
150.0000 mg | ORAL_TABLET | Freq: Every day | ORAL | Status: DC
  Administered 2021-06-24: 150 mg via ORAL
  Filled 2021-06-24: qty 1

## 2021-06-24 NOTE — Discharge Summary (Signed)
Physician Discharge Summary   Patient: Kyle Berry MRN: [id number] DOB: March 15, 1971  Admit date:     06/21/2021  Discharge date: 06/24/2021  Discharge Physician: Geradine Girt   PCP: [name] (Inactive)   Recommendations at discharge:   Patient to follow up with GI for pancreatic enzymes   Discharge Diagnoses: Principal Problem:   GI bleed Active Problems:   Acute on chronic pancreatitis ([place])   Acute posthemorrhagic anemia   Intravenous infiltration    Hospital Course: Kyle Berry is a 51 y.o. male with medical history significant of chronic pancreatitis who presents with a painless episode of GI bleeding today while at work.  Patient had been out of his pancreatitic enzymes for weeks now due to cost and could feel that his pancreatitis was starting to flare.  He denies ever having GI bleeding except for after his colonoscopy in 2021 when he had post-polypectomy bleeding requiring seven hemostatic clips.   No further bleeding episodes and slowly getting better in regards to his pancreatitis.  Needs creon to stay out of the hospital.  Assessment and Plan: * GI bleed -appears self-limiting -Hgb stable  -no further bleeding -prob diverticular -GI consult appreciated- colonoscopy if re-bleeds  Intravenous infiltration -contrast extravasation  -improved this AM -seen by IR    Acute posthemorrhagic anemia -h/h stable -no further bleeding noted  Acute on chronic pancreatitis ([facility]) -appears mild -has been out of his pancreatic enzymes due to cost -pain controlled -diet advanced      Consultants: gi  Disposition: Home Diet recommendation:  Discharge Diet Orders (From admission, onward)     Start     Ordered   06/24/21 0000  Diet - low sodium heart healthy        06/24/21 1426           LOW FAT DISCHARGE MEDICATION: Allergies as of 06/24/2021       Reactions   Diclofenac Hives   Norvasc [amlodipine Besylate] Other (See  Comments)   Fluid buildup in chest   Omeprazole Other (See Comments)   MD stopped due to pancreatitis   Prednisone Other (See Comments)   Mood swings        Medication List     STOP taking these medications    acetaminophen 325 MG tablet Commonly known as: TYLENOL       TAKE these medications    famotidine 20 MG tablet Commonly known as: PEPCID Take 1 tablet (20 mg total) by mouth 2 (two) times daily. Instructions: Take Pepcid 20 mg twice daily x30 days, then take Pepcid 20 mg daily thereafter. What changed:  when to take this additional instructions   feeding supplement Liqd Take 237 mLs by mouth 2 (two) times daily between meals.   lipase/protease/amylase 36000 UNITS Cpep capsule Commonly known as: CREON Take 2 capsules (72,000 Units total) by mouth 3 (three) times daily with meals.   oxyCODONE-acetaminophen 5-325 MG tablet Commonly known as: PERCOCET/ROXICET Take 1-2 tablets by mouth every 6 (six) hours as needed for moderate pain.   promethazine 25 MG tablet Commonly known as: PHENERGAN Take 1 tablet (25 mg total) by mouth every 6 (six) hours as needed for nausea or vomiting.   telmisartan 40 MG tablet Commonly known as: MICARDIS Take 1 tablet (40 mg total) by mouth daily.   VISINE-A OP Place 1 drop into both eyes daily as needed (red eyes).        Follow-up Information     [name], [name] Follow up  in 1 week(s).   Contact information: [place] [facility] [place] [address] [phone]         Skeet Latch, MD .   Specialty: Cardiology Contact information: [address] [place] [address] [phone]                Discharge Exam: Danley Danker Weights   06/22/21 1538  Weight: 75.8 kg     Condition at discharge: good  The results of significant diagnostics from this hospitalization (including imaging, microbiology, ancillary and laboratory) are listed below for  reference.   Imaging Studies: CT ABDOMEN PELVIS W CONTRAST  Result Date: 06/21/2021 CLINICAL DATA:  Epigastric discomfort. Rectal bleeding. History of pancreatitis. History of chronic pancreatitis. Bright red blood per rectum. EXAM: CT ABDOMEN AND PELVIS WITH CONTRAST TECHNIQUE: Multidetector CT imaging of the abdomen and pelvis was performed using the standard protocol following bolus administration of intravenous contrast. RADIATION DOSE REDUCTION: This exam was performed according to the departmental dose-optimization program which includes automated exposure control, adjustment of the mA and/or kV according to patient size and/or use of iterative reconstruction technique. CONTRAST:  147m OMNIPAQUE IOHEXOL 300 MG/ML  SOLN COMPARISON:  04/21/2021 FINDINGS: Lower chest: Clear lung bases. Normal heart size without pericardial or pleural effusion. Hepatobiliary: Normal liver. Cholecystectomy. No intra or extrahepatic biliary duct dilatation. No choledocholithiasis. Pancreas: Mild pancreatic atrophy. Parenchymal calcification in the head is likely related to chronic calcific pancreatitis. Moderate diffuse peripancreatic edema with thickening of the anterior renal fascia. Relatively similar to 04/21/2021. No pancreatic necrosis, well-circumscribed peripancreatic fluid collection, or duct dilatation. Spleen: Normal in size, without focal abnormality. Adrenals/Urinary Tract: Normal adrenal glands. Marked right renal atrophy. Lower pole left renal collecting system stones of up to 4 mm. No hydronephrosis. Normal urinary bladder. Stomach/Bowel: Gastric antral underdistention. Normal colon, appendix, and terminal ileum. Normal small bowel. Vascular/Lymphatic: No splenic artery aneurysm. Normal aortic caliber. Patent portal and splenic veins. 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 Similar. No abdominopelvic adenopathy. Reproductive: Normal prostate. Other: No  significant free fluid. Musculoskeletal: Degenerate disc disease and a disc bulge at the lumbosacral junction. IMPRESSION: 1. Moderate non complicated pancreatitis. 2. Right renal atrophy. 3. Left nephrolithiasis. Electronically Signed   By: KAbigail MiyamotoM.D.   On: 06/21/2021 16:28    Microbiology: Results for orders placed or performed during the hospital encounter of 04/21/21  Urine Culture     Status: None   Collection Time: 04/21/21  6:23 AM   Specimen: Urine, Clean Catch  Result Value Ref Range Status   Specimen Description URINE, CLEAN CATCH  Final   Special Requests NONE  Final   Culture   Final    NO GROWTH Performed at [facility] [address] [place] [address]   Report Status 04/23/2021 FINAL  Final  Resp Panel by RT-PCR (Flu A&B, Covid) Nasopharyngeal Swab     Status: None   Collection Time: 04/21/21  5:21 PM   Specimen: Nasopharyngeal Swab; Nasopharyngeal(NP) swabs in vial transport medium  Result Value Ref Range Status   SARS Coronavirus 2 by RT PCR NEGATIVE NEGATIVE Final    Comment: (NOTE) SARS-CoV-2 target nucleic acids are NOT DETECTED.  The SARS-CoV-2 RNA is generally detectable in upper respiratory specimens during the acute phase of infection. The lowest concentration of SARS-CoV-2 viral copies this assay can detect is 138 copies/mL. A negative result does not preclude SARS-Cov-2 infection and should not be used  as the sole basis for treatment or other patient management decisions. A negative result may occur with  improper specimen collection/handling, submission of specimen other than nasopharyngeal swab, presence of viral mutation(s) within the areas targeted by this assay, and inadequate number of viral copies(<138 copies/mL). A negative result must be combined with clinical observations, patient history, and epidemiological information. The expected result is Negative.  Fact Sheet for Patients:   EntrepreneurPulse.com.au  Fact Sheet for Healthcare Providers:  IncredibleEmployment.be  This test is no t yet approved or cleared by the [place] FDA and  has been authorized for detection and/or diagnosis of SARS-CoV-2 by FDA under an Emergency Use Authorization (EUA). This EUA will remain  in effect (meaning this test can be used) for the duration of the COVID-19 declaration under Section 564(b)(1) of the Act, 21 U.S.C.section 360bbb-3(b)(1), unless the authorization is terminated  or revoked sooner.       Influenza A by PCR NEGATIVE NEGATIVE Final   Influenza B by PCR NEGATIVE NEGATIVE Final    Comment: (NOTE) The Xpert Xpress SARS-CoV-2/FLU/RSV plus assay is intended as an aid in the diagnosis of influenza from Nasopharyngeal swab specimens and should not be used as a sole basis for treatment. Nasal washings and aspirates are unacceptable for Xpert Xpress SARS-CoV-2/FLU/RSV testing.  Fact Sheet for Patients: EntrepreneurPulse.com.au  Fact Sheet for Healthcare Providers: IncredibleEmployment.be  This test is not yet approved or cleared by the [place] FDA and has been authorized for detection and/or diagnosis of SARS-CoV-2 by FDA under an Emergency Use Authorization (EUA). This EUA will remain in effect (meaning this test can be used) for the duration of the COVID-19 declaration under Section 564(b)(1) of the Act, 21 U.S.C. section 360bbb-3(b)(1), unless the authorization is terminated or revoked.  Performed at [facility], [place] [address] [place], [place] [address]     Labs: CBC: Recent Labs  Lab 06/21/21 1121 06/21/21 1540 06/21/21 1914 06/22/21 0915 06/23/21 0224  WBC 8.7 6.4 7.9  --  5.5  NEUTROABS 5.7 4.2  --   --   --   HGB 13.0 10.4* 12.6* 12.0* 10.9*  HCT 41.3 31.9* 36.8*  --  32.5*  MCV 103.8* 102.2* 100.0  --  101.6*  PLT 189 163 207  --  665   Basic  Metabolic Panel: Recent Labs  Lab 06/21/21 1121 06/22/21 0236 06/23/21 0224  NA 136 136 136  K 3.6 4.1 3.8  CL 103 103 104  CO2 20* 20* 26  GLUCOSE 105* 98 105*  BUN 8 8 5*  CREATININE 1.17 1.09 1.03  CALCIUM 9.3 8.8* 8.4*   Liver Function Tests: Recent Labs  Lab 06/21/21 1121 06/22/21 0236  AST 32 31  ALT 20 17  ALKPHOS 57 53  BILITOT 1.6* 1.7*  PROT 7.5 7.1  ALBUMIN 4.1 3.6   CBG: No results for input(s): GLUCAP in the last 168 hours.  Discharge time spent: greater than 30 minutes.  Signed: Geradine Girt, DO [facility] 06/25/2021

## 2021-06-24 NOTE — Plan of Care (Signed)

## 2021-06-24 NOTE — Progress Notes (Signed)
[name] to be D/C'd  per MD order.  Discussed with the patient and all questions fully answered. ? ?VSS, Skin clean, dry and intact without evidence of skin break down, no evidence of skin tears noted. ? ?IV catheter discontinued intact. Site without signs and symptoms of complications. Dressing and pressure applied. ? ?An After Visit Summary was printed and given to the patient. Patient received prescription. ? ?D/c education completed with patient/family including follow up instructions, medication list, d/c activities limitations if indicated, with other d/c instructions as indicated by MD - patient able to verbalize understanding, all questions fully answered.  ? ?Patient instructed to return to ED, call 911, or call MD for any changes in condition.  ? ?Patient to be escorted via [place], and D/C home via private auto.  ?

## 2021-06-26 NOTE — Telephone Encounter (Signed)
Called and spoke with pt this morning to remind him to come pick up the Cre[employer] Patient Assistance Forms that he needs to complete. Pt states that either him or his brother will come by and pick it up. Pt knows to pick up from the 2nd floor receptionist desk. Pt verbalized understanding and had no concerns at the end of the call. ?

## 2021-07-10 ENCOUNTER — Telehealth: Payer: Self-pay

## 2021-07-10 NOTE — Telephone Encounter (Signed)
Attempted to reach pt. His phone goes straight to vm. I left a vm for patient to return call. ?

## 2021-07-10 NOTE — Telephone Encounter (Signed)
-----   Message from Yevette Edwards, RN sent at 07/05/2021 12:42 PM EDT ----- ?Regarding: Assistance forms ?Follow up with patient regarding Cre[employer] patient assistance forms ? ?

## 2021-07-13 NOTE — Telephone Encounter (Signed)
Lm on vm for patient to return call 

## 2021-07-13 NOTE — Telephone Encounter (Signed)
Patient returned call, please advise.  

## 2021-07-14 NOTE — Telephone Encounter (Signed)
Called and spoke with patient. He reports that he completed the Creon patient assistance forms and faxed the forms back to [employer] about 2 weeks ago. Pt states that he has not heard from [facility] assist yet, pt knows to follow up with them. Pt verbalized understanding and had no concerns at the end of the call. ? ?Provider sheet for [employer] assist was faxed on 06/23/21. ?

## 2021-08-09 ENCOUNTER — Other Ambulatory Visit: Payer: Self-pay

## 2021-08-09 ENCOUNTER — Encounter (HOSPITAL_COMMUNITY): Payer: Self-pay | Admitting: Radiology

## 2021-08-09 ENCOUNTER — Inpatient Hospital Stay (HOSPITAL_COMMUNITY)
Admission: EM | Admit: 2021-08-09 | Discharge: 2021-08-12 | DRG: 439 | Disposition: A | Discharge status: Home / Self Care | Payer: Managed Care, Other (non HMO) | Attending: Internal Medicine | Admitting: Internal Medicine

## 2021-08-09 ENCOUNTER — Emergency Department (HOSPITAL_COMMUNITY): Payer: Managed Care, Other (non HMO)

## 2021-08-09 DIAGNOSIS — Z91120 Patient's intentional underdosing of medication regimen due to financial hardship: Secondary | ICD-10-CM

## 2021-08-09 DIAGNOSIS — G8929 Other chronic pain: Secondary | ICD-10-CM | POA: Diagnosis present

## 2021-08-09 DIAGNOSIS — K3184 Gastroparesis: Secondary | ICD-10-CM | POA: Diagnosis present

## 2021-08-09 DIAGNOSIS — K921 Melena: Secondary | ICD-10-CM | POA: Diagnosis present

## 2021-08-09 DIAGNOSIS — Z888 Allergy status to other drugs, medicaments and biological substances status: Secondary | ICD-10-CM

## 2021-08-09 DIAGNOSIS — K920 Hematemesis: Secondary | ICD-10-CM | POA: Diagnosis present

## 2021-08-09 DIAGNOSIS — Q600 Renal agenesis, unilateral: Secondary | ICD-10-CM

## 2021-08-09 DIAGNOSIS — K859 Acute pancreatitis without necrosis or infection, unspecified: Principal | ICD-10-CM | POA: Diagnosis present

## 2021-08-09 DIAGNOSIS — Z9049 Acquired absence of other specified parts of digestive tract: Secondary | ICD-10-CM

## 2021-08-09 DIAGNOSIS — Z87442 Personal history of urinary calculi: Secondary | ICD-10-CM

## 2021-08-09 DIAGNOSIS — T475X6A Underdosing of digestants, initial encounter: Secondary | ICD-10-CM | POA: Diagnosis present

## 2021-08-09 DIAGNOSIS — R310 Gross hematuria: Secondary | ICD-10-CM | POA: Diagnosis present

## 2021-08-09 DIAGNOSIS — K219 Gastro-esophageal reflux disease without esophagitis: Secondary | ICD-10-CM | POA: Diagnosis present

## 2021-08-09 DIAGNOSIS — M545 Low back pain, unspecified: Secondary | ICD-10-CM | POA: Diagnosis present

## 2021-08-09 DIAGNOSIS — K861 Other chronic pancreatitis: Secondary | ICD-10-CM | POA: Diagnosis present

## 2021-08-09 DIAGNOSIS — K8689 Other specified diseases of pancreas: Secondary | ICD-10-CM | POA: Diagnosis present

## 2021-08-09 DIAGNOSIS — E7800 Pure hypercholesterolemia, unspecified: Secondary | ICD-10-CM | POA: Diagnosis present

## 2021-08-09 DIAGNOSIS — Z8249 Family history of ischemic heart disease and other diseases of the circulatory system: Secondary | ICD-10-CM

## 2021-08-09 DIAGNOSIS — I10 Essential (primary) hypertension: Secondary | ICD-10-CM | POA: Diagnosis present

## 2021-08-09 DIAGNOSIS — Q453 Other congenital malformations of pancreas and pancreatic duct: Secondary | ICD-10-CM

## 2021-08-09 LAB — URINALYSIS, ROUTINE W REFLEX MICROSCOPIC
Bacteria, UA: NONE SEEN
Bilirubin Urine: NEGATIVE
Glucose, UA: NEGATIVE mg/dL
Ketones, ur: NEGATIVE mg/dL
Leukocytes,Ua: NEGATIVE
Nitrite: NEGATIVE
Protein, ur: 30 mg/dL — AB
Specific Gravity, Urine: 1.013 (ref 1.005–1.030)
pH: 6 (ref 5.0–8.0)

## 2021-08-09 LAB — LIPASE, BLOOD: Lipase: 109 U/L — ABNORMAL HIGH (ref 11–51)

## 2021-08-09 LAB — COMPREHENSIVE METABOLIC PANEL
ALT: 30 U/L (ref 0–44)
AST: 57 U/L — ABNORMAL HIGH (ref 15–41)
Albumin: 3.9 g/dL (ref 3.5–5.0)
Alkaline Phosphatase: 57 U/L (ref 38–126)
Anion gap: 10 (ref 5–15)
BUN: 7 mg/dL (ref 6–20)
CO2: 25 mmol/L (ref 22–32)
Calcium: 9.4 mg/dL (ref 8.9–10.3)
Chloride: 102 mmol/L (ref 98–111)
Creatinine, Ser: 1.21 mg/dL (ref 0.61–1.24)
GFR, Estimated: >60 mL/min (ref >60)
Glucose, Bld: 140 mg/dL — ABNORMAL HIGH (ref 70–99)
Potassium: 3.3 mmol/L — ABNORMAL LOW (ref 3.5–5.1)
Sodium: 137 mmol/L (ref 135–145)
Total Bilirubin: 1.1 mg/dL (ref 0.3–1.2)
Total Protein: 7.2 g/dL (ref 6.5–8.1)

## 2021-08-09 LAB — CBC
HCT: 40.5 % (ref 39.0–52.0)
Hemoglobin: 13.9 g/dL (ref 13.0–17.0)
MCH: 34.3 pg — ABNORMAL HIGH (ref 26.0–34.0)
MCHC: 34.3 g/dL (ref 30.0–36.0)
MCV: 100 fL (ref 80.0–100.0)
Platelets: 172 10*3/uL (ref 150–400)
RBC: 4.05 MIL/uL — ABNORMAL LOW (ref 4.22–5.81)
RDW: 17.4 % — ABNORMAL HIGH (ref 11.5–15.5)
WBC: 7.6 10*3/uL (ref 4.0–10.5)
nRBC: 0 % (ref 0.0–0.2)

## 2021-08-09 MED ORDER — ENOXAPARIN SODIUM 40 MG/0.4ML IJ SOSY
40.0000 mg | PREFILLED_SYRINGE | INTRAMUSCULAR | Status: DC
  Administered 2021-08-09: 40 mg via SUBCUTANEOUS
  Filled 2021-08-09: qty 0.4

## 2021-08-09 MED ORDER — SODIUM CHLORIDE 0.9 % IV BOLUS
1000.0000 mL | Freq: Once | INTRAVENOUS | Status: AC
  Administered 2021-08-09: 1000 mL via INTRAVENOUS

## 2021-08-09 MED ORDER — HYDROMORPHONE HCL 1 MG/ML IJ SOLN
1.0000 mg | Freq: Once | INTRAMUSCULAR | Status: AC
  Administered 2021-08-09: 1 mg via INTRAVENOUS
  Filled 2021-08-09: qty 1

## 2021-08-09 MED ORDER — IOHEXOL 300 MG/ML  SOLN
100.0000 mL | Freq: Once | INTRAMUSCULAR | Status: AC | PRN
  Administered 2021-08-09: 100 mL via INTRAVENOUS

## 2021-08-09 MED ORDER — HYDROMORPHONE HCL 1 MG/ML IJ SOLN
1.0000 mg | INTRAMUSCULAR | Status: DC | PRN
  Administered 2021-08-09 – 2021-08-10 (×4): 1 mg via INTRAVENOUS
  Filled 2021-08-09 (×4): qty 1

## 2021-08-09 MED ORDER — HYDROMORPHONE HCL 1 MG/ML IJ SOLN
0.5000 mg | Freq: Once | INTRAMUSCULAR | Status: AC
  Administered 2021-08-09: 0.5 mg via INTRAVENOUS
  Filled 2021-08-09: qty 1

## 2021-08-09 MED ORDER — ONDANSETRON HCL 4 MG/2ML IJ SOLN
4.0000 mg | Freq: Once | INTRAMUSCULAR | Status: AC
  Administered 2021-08-09: 4 mg via INTRAVENOUS
  Filled 2021-08-09: qty 2

## 2021-08-09 MED ORDER — POTASSIUM CHLORIDE 20 MEQ PO PACK
40.0000 meq | PACK | Freq: Two times a day (BID) | ORAL | Status: DC
Start: 2021-08-09 — End: 2021-08-10
  Administered 2021-08-09 (×2): 40 meq via ORAL
  Filled 2021-08-09 (×2): qty 2

## 2021-08-09 MED ORDER — LACTATED RINGERS IV SOLN: INTRAVENOUS | Status: DC

## 2021-08-09 MED ORDER — PROCHLORPERAZINE EDISYLATE 10 MG/2ML IJ SOLN
10.0000 mg | Freq: Four times a day (QID) | INTRAMUSCULAR | Status: DC | PRN
Start: 2021-08-09 — End: 2021-08-12
  Administered 2021-08-09 – 2021-08-12 (×5): 10 mg via INTRAVENOUS
  Filled 2021-08-09 (×5): qty 2

## 2021-08-09 MED ORDER — PANCRELIPASE (LIP-PROT-AMYL) 36000-114000 UNITS PO CPEP
36000.0000 [IU] | ORAL_CAPSULE | Freq: Once | ORAL | Status: AC
  Administered 2021-08-09: 36000 [IU] via ORAL
  Filled 2021-08-09: qty 1

## 2021-08-09 MED ORDER — HYDROMORPHONE HCL 1 MG/ML IJ SOLN: 0.5000 mg | INTRAMUSCULAR | Status: DC | PRN

## 2021-08-09 MED ORDER — FENTANYL CITRATE PF 50 MCG/ML IJ SOSY
25.0000 ug | PREFILLED_SYRINGE | Freq: Once | INTRAMUSCULAR | Status: AC
  Administered 2021-08-09: 25 ug via INTRAVENOUS
  Filled 2021-08-09: qty 1

## 2021-08-09 MED ORDER — SODIUM CHLORIDE 0.9 % IV SOLN
12.5000 mg | Freq: Four times a day (QID) | INTRAVENOUS | Status: DC | PRN
  Administered 2021-08-09: 12.5 mg via INTRAVENOUS
  Filled 2021-08-09 (×2): qty 0.5

## 2021-08-09 MED ORDER — IRBESARTAN 75 MG PO TABS
150.0000 mg | ORAL_TABLET | Freq: Every day | ORAL | Status: DC
  Administered 2021-08-09 – 2021-08-12 (×4): 150 mg via ORAL
  Filled 2021-08-09: qty 2
  Filled 2021-08-09: qty 1
  Filled 2021-08-09 (×2): qty 2

## 2021-08-09 NOTE — ED Notes (Signed)
Dinner tray ordered.

## 2021-08-09 NOTE — ED Notes (Signed)
Dinner tray delivered.

## 2021-08-09 NOTE — ED Notes (Signed)
Transport entered ?

## 2021-08-09 NOTE — ED Triage Notes (Signed)
Pt. Stated, Im having pancreatitis with N/V abdominal pain ?

## 2021-08-09 NOTE — ED Provider Notes (Signed)
?[facility] ?Provider Note ? ? ?CSN: [id number] ?Arrival date & time: 08/09/21  1194 ? ?  ? ?History ?Chief Complaint  ?Patient presents with  ? Abdominal Pain  ? Emesis  ? Nausea  ? ? ?Kyle Berry is a 51 y.o. male with longstanding history of chronic pancreatitis, gastroparesis, GERD, hypertension, and hyperlipidemia who presents to the emergency department with a 2-week history of upper abdominal pain.  Patient states this feels identical to his previous pancreatitis flareups.  He has been unable to take his pancreatic enzymes as it has been too expensive and insurance is no longer covering it.  He reports associated nausea and vomiting and has been unable to tolerate anything p.o. for several days.  He has been unable to take his blood pressure medications.  Last took them Sunday.  Also reporting associated subjective fever and chills.  No urinary complaints.  No chest pain or shortness of breath. ? ?Looks a history of chronic pancreatitis, gastroparesis, GERD, hypertension who presents to the emergency department with a 2-week history of upper abdominal pain.  He states this feels identical to previous pancreatic flares.  He has been unable to take his pancreatic enzymes. ? ? ?Abdominal Pain ?Associated symptoms: vomiting   ?Emesis ?Associated symptoms: abdominal pain   ? ?  ? ?Home Medications ?Prior to Admission medications   ?Medication Sig Start Date End Date Taking? Authorizing Provider  ?acetaminophen (TYLENOL) 325 MG tablet Take 650 mg by mouth every 6 (six) hours as needed.   Yes [provider]  ?famotidine (PEPCID) 20 MG tablet Take 1 tablet (20 mg total) by mouth 2 (two) times daily. Instructions: ?Take Pepcid 20 mg twice daily x30 days, then take Pepcid 20 mg daily thereafter. ?Patient taking differently: Take 20 mg by mouth as needed. 11/03/20 08/19/21 Yes Kayleen Memos, DO  ?feeding supplement (ENSURE ENLIVE / ENSURE PLUS) LIQD Take 237 mLs by  mouth 2 (two) times daily between meals. 02/04/21  Yes Dahal, Marlowe Aschoff, MD  ?Naphazoline-Pheniramine (VISINE-A OP) Place 1 drop into both eyes daily as needed (red eyes).   Yes [provider]  ?promethazine (PHENERGAN) 25 MG tablet Take 1 tablet (25 mg total) by mouth every 6 (six) hours as needed for nausea or vomiting. 06/24/21  Yes Eulogio Bear U, DO  ?telmisartan (MICARDIS) 40 MG tablet Take 1 tablet (40 mg total) by mouth daily. 04/26/21 08/18/21 Yes Idamae Schuller, MD  ?oxyCODONE-acetaminophen (PERCOCET/ROXICET) 5-325 MG tablet Take 1-2 tablets by mouth every 6 (six) hours as needed for moderate pain. 06/24/21   Geradine Girt, DO  ?   ? ?Allergies    ?Diclofenac, Norvasc [amlodipine besylate], Omeprazole, and Prednisone   ? ?Review of Systems   ?Review of Systems  ?Gastrointestinal:  Positive for abdominal pain and vomiting.  ?All other systems reviewed and are negative. ? ?Physical Exam ?Updated Vital Signs ?BP (!) 147/93   Pulse 63   Temp 98.6 ?F (37 ?C) (Oral)   Resp 16   Ht '6\' 1"'$  (1.854 m)   Wt 76.2 kg   SpO2 96%   BMI 22.16 kg/m?  ?Physical Exam ?Vitals and nursing note reviewed.  ?Constitutional:   ?   General: He is not in acute distress. ?   Appearance: Normal appearance.  ?HENT:  ?   Head: Normocephalic and atraumatic.  ?Eyes:  ?   General:     ?   Right eye: No discharge.     ?   Left  eye: No discharge.  ?Cardiovascular:  ?   Comments: Regular rate and rhythm.  S1/S2 are distinct without any evidence of murmur, rubs, or gallops.  Radial pulses are 2+ bilaterally.  Dorsalis pedis pulses are 2+ bilaterally.  No evidence of pedal edema. ?Pulmonary:  ?   Comments: Clear to auscultation bilaterally.  Normal effort.  No respiratory distress.  No evidence of wheezes, rales, or rhonchi heard throughout. ?Abdominal:  ?   General: Abdomen is flat. Bowel sounds are normal. There is no distension.  ?   Tenderness: There is abdominal tenderness. There is no guarding or rebound.  ?   Comments: Upper  abdominal tenderness.  ?Musculoskeletal:     ?   General: Normal range of motion.  ?   Cervical back: Neck supple.  ?Skin: ?   General: Skin is warm and dry.  ?   Findings: No rash.  ?Neurological:  ?   General: No focal deficit present.  ?   Mental Status: He is alert.  ?Psychiatric:     ?   Mood and Affect: Mood normal.     ?   Behavior: Behavior normal.  ? ? ?ED Results / Procedures / Treatments   ?Labs ?(all labs ordered are listed, but only abnormal results are displayed) ?Labs Reviewed  ?LIPASE, BLOOD - Abnormal; Notable for the following components:  ?    Result Value  ? Lipase 109 (*)   ? All other components within normal limits  ?COMPREHENSIVE METABOLIC PANEL - Abnormal; Notable for the following components:  ? Potassium 3.3 (*)   ? Glucose, Bld 140 (*)   ? AST 57 (*)   ? All other components within normal limits  ?CBC - Abnormal; Notable for the following components:  ? RBC 4.05 (*)   ? MCH 34.3 (*)   ? RDW 17.4 (*)   ? All other components within normal limits  ?URINALYSIS, ROUTINE W REFLEX MICROSCOPIC - Abnormal; Notable for the following components:  ? Hgb urine dipstick SMALL (*)   ? Protein, ur 30 (*)   ? All other components within normal limits  ? ? ?EKG ?None ? ?Radiology ?CT ABDOMEN PELVIS W CONTRAST ? ?Result Date: 08/09/2021 ?CLINICAL DATA:  Abdominal pain, neutropenia, history of pancreatitis EXAM: CT ABDOMEN AND PELVIS WITH CONTRAST TECHNIQUE: Multidetector CT imaging of the abdomen and pelvis was performed using the standard protocol following bolus administration of intravenous contrast. RADIATION DOSE REDUCTION: This exam was performed according to the departmental dose-optimization program which includes automated exposure control, adjustment of the mA and/or kV according to patient size and/or use of iterative reconstruction technique. CONTRAST:  100 mL Omnipaque 300 iodinated contrast IV COMPARISON:  06/21/2021 FINDINGS: Lower chest: No acute abnormality. Bandlike scarring of the  bilateral lung bases Hepatobiliary: No focal liver abnormality is seen. Hepatic steatosis. Status post cholecystectomy. No biliary dilatation. Pancreas: Similar appearance of diffuse peripancreatic fat stranding and fluid. Spleen: Normal in size without significant abnormality. Adrenals/Urinary Tract: Adrenal glands are unremarkable. 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 The left kidney is normal, without renal calculi, solid lesion, or hydronephrosis. Bladder is unremarkable. Stomach/Bowel: Stomach is within normal limits. Appendix appears normal. No evidence of bowel wall thickening, distention, or inflammatory changes. Vascular/Lymphatic: No significant vascular findings are present. No enlarged abdominal or pelvic lymph nodes. Reproductive: No mass or other significant abnormality. Other: No abdominal wall hernia or abnormality. No ascites. Musculoskeletal: No acute or significant osseous findings. IMPRESSION: 1. Similar appearance of diffuse peripancreatic fat stranding  and fluid, consistent with acute pancreatitis. No evidence of acute pancreatic fluid collection or pancreatic necrosis. 2. Hepatic steatosis. 3. Status post cholecystectomy. 4. 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 Electronically Signed   By: Delanna Ahmadi M.D.   On: 08/09/2021 13:48   ? ?Procedures ?Procedures  ? ? ?Medications Ordered in ED ?Medications  ?potassium chloride (KLOR-CON) packet 40 mEq (40 mEq Oral Given 08/09/21 1233)  ?fentaNYL (SUBLIMAZE) injection 25 mcg (25 mcg Intravenous Given 08/09/21 0843)  ?ondansetron [facility]) injection 4 mg (4 mg Intravenous Given 08/09/21 0843)  ?sodium chloride 0.9 % bolus 1,000 mL (0 mLs Intravenous Stopped 08/09/21 1026)  ?HYDROmorphone (DILAUDID) injection 0.5 mg (0.5 mg Intravenous Given 08/09/21 1026)  ?lipase/protease/amylase (CREON) capsule 36,000 Units (36,000 Units Oral Given 08/09/21 1235)  ?HYDROmorphone (DILAUDID) injection 1 mg (1 mg Intravenous Given  08/09/21 1232)  ?iohexol (OMNIPAQUE) 300 MG/ML solution 100 mL (100 mLs Intravenous Contrast Given 08/09/21 1342)  ? ? ?ED Course/ Medical Decision Making/ A&P ? ?                        ?Medical Decision Making ?Amount a

## 2021-08-09 NOTE — Plan of Care (Signed)
Pt oriented to unit, bed in low position and call bell within reach ?

## 2021-08-09 NOTE — H&P (Addendum)
?Date: 08/09/2021     ?     ?     ?Patient Name:  Kyle Berry MRN: [id number]  ?DOB: Nov 24, 1970 Age / Sex: 51 y.o., male   ?PCP: [name] (Inactive)    ?     ?     ?Medical Service: Internal Medicine Teaching Service    ?     ?     ?Attending Physician: Dr. Evette Doffing, Mallie Mussel, *    ?First Contact: Clydell Hakim, Medical student Pager: [phone]  ?Second Contact: Dr. Christiana Fuchs Pager: [phone]  ?Third Contact Dr. Sanjuan Dame Pager: [phone]  ?     ?After Hours (After 5p/  First Contact Pager: [phone]  ?weekends / holidays): Second Contact Pager: [phone]  ? ?Chief Complaint: Abdominal pain, nausea, vomiting ? ?History of Present Illness: Kyle Berry is a 51 y.o. male with PMH significant for chronic pancreatitis, pancreatic divisum, hypertension, and GERD, presenting for x2 weeks of abdominal pain, nausea, and vomiting that has been worsening over time. The pain is localized to the left upper quadrant and has progressed to the point where he is unable to tolerate any PO intake. He also reports streaks of blood in his vomit as well but not all the time. He also reports some bright red blood in his stool. Patient has multiple similar episodes in the past that required hospital admission and states he feels similar to his prior pancreatitis attacks with the most recent admission being around x1 month ago. While his current symptoms feel similar to prior pancreatitis episodes, he endorses subjective fever and chills for the past x3 days which is new and different from his prior episodes.  ? ?He has been prescribed creon for his pancreatic insufficiency however has not been able to obtain this medication due to cost. He does have phenergan at home which he takes as needed and does provide some symptomatic relief. These episodes of pancreatitis has been debilitating, causing him to miss work and overall affect his quality of living.  ? ?Patient has been seen by [facility] gastro[facility]ology and  is currently followed by them. He underwent pancreatic duct stent placement in July 2022 however was removed soon after it was placed due to presence of pancreatic stones.  ? ?Meds:  ?Current Meds  ?Medication Sig  ? acetaminophen (TYLENOL) 325 MG tablet Take 650 mg by mouth every 6 (six) hours as needed.  ? famotidine (PEPCID) 20 MG tablet Take 1 tablet (20 mg total) by mouth 2 (two) times daily. Instructions: ?Take Pepcid 20 mg twice daily x30 days, then take Pepcid 20 mg daily thereafter. (Patient taking differently: Take 20 mg by mouth as needed.)  ? feeding supplement (ENSURE ENLIVE / ENSURE PLUS) LIQD Take 237 mLs by mouth 2 (two) times daily between meals.  ? Naphazoline-Pheniramine (VISINE-A OP) Place 1 drop into both eyes daily as needed (red eyes).  ? promethazine (PHENERGAN) 25 MG tablet Take 1 tablet (25 mg total) by mouth every 6 (six) hours as needed for nausea or vomiting.  ? telmisartan (MICARDIS) 40 MG tablet Take 1 tablet (40 mg total) by mouth daily.  ? ? ? ?Allergies: ?Allergies as of 08/09/2021 - Review Complete 08/09/2021  ?Allergen Reaction Noted  ? Diclofenac Hives 12/14/2013  ? Norvasc [amlodipine besylate] Other (See Comments) 08/15/2016  ? Omeprazole Other (See Comments) 08/15/2016  ? Prednisone Other (See Comments) 12/14/2013  ? ?Past Medical History:  ?Diagnosis Date  ? Atrophy of right kidney   ? Chronic bronchitis (  HCC)   ? Chronic lower back pain   ? Chronic pancreatitis ([place])   ? Gastroparesis 06/2020  ? confirmed by emptying study at [facility]  ? GERD (gastroesophageal reflux disease)   ? Headache   ? "once/month" (05/13/2017)  ? High cholesterol   ? Hypertension   ? Migraine   ? "a couple/year" (05/13/2017)  ? Nephrolithiasis 05/13/2017  ? Pancreatitis   ? Pneumonia ~ 09/2015  ? Presence of pancreatic duct stent   ? Recurrent acute pancreatitis   ? Shortness of breath 05/22/2021  ? ?Family History: Heart disease in grandmother, passed away in her 48s.  ? ?Social History:  ?Lives with  girlfriend at home ?Works as a Engineer, building services ?No history of tobacco use ?Quit drinking alcohol ?Denies other illicit drug use ? ?Surgical history ?S/p cholecystectomy in 2019 ? ?Review of Systems: ?A complete ROS was negative except as per HPI.  ? ? ?Physical Exam: ?Blood pressure (!) 147/93, pulse 63, temperature 98.6 ?F (37 ?C), temperature source Oral, resp. rate 16, height '6\' 1"'$  (1.854 m), weight 76.2 kg, SpO2 96 %. ?Physical Exam ?Constitutional:   ?   Appearance: He is well-developed.  ?HENT:  ?   Head: Normocephalic.  ?Cardiovascular:  ?   Rate and Rhythm: Normal rate and regular rhythm.  ?   Heart sounds: No murmur heard. ?Pulmonary:  ?   Effort: Pulmonary effort is normal.  ?   Breath sounds: Normal breath sounds.  ?Abdominal:  ?   General: Bowel sounds are normal.  ?   Tenderness: There is abdominal tenderness in the left upper quadrant.  ?Neurological:  ?   Mental Status: He is alert.  ? ? ?Assessment & Plan by Problem: ? ?Acute on chronic pancreatitis ?LUQ pain ?-Patient with recurrent episodes of LUQ abdominal pain consistent with his prior episode of pancreatitis in the past. He has been unable to obtain his Creon prescription as it is cost prohibitive.  ?-Episodes of abdominal pain, nausea, and vomiting have progressed to the point where he is unable to tolerate any PO intake and patient failed PO challenge in the ED.  ?-CT abdomen/pelvis showed evidence of diffuse peripancreatic fat stranding, consistent with acute pancreatitis. No acute pancreatic fluid collection or necrosis.  ?-Lipase elevated at 109. Labs are otherwise reassuring and vital signs are stable.  ?-Will plan to admit patient and start him on moderate fluid resuscitation 1.5 mL/kg/hr.  ?-Control pain with dilaudid PRN ?-Phenergan PRN for nausea ?-Early nutrition as tolerated ? ?Hematemesis ?Hematochezia ?-Patient has been having streaks of blood in his vomit as well as bright red blood in his stool.  ?-CBC is stable with Hg of 13.9  and Hct of 40.5.  ?-During last hospital admission in March patient also had episodes of GI bleeding. GI team suspected diverticular bleeding at that time. Last colonoscopy was done in 2021 which showed patient had polypectomy bleed in the cecum, treated with clipping as well as clipping of the sigmoid polypectomy site.  ?-Will closely monitor his CBC during this admission and recurrent episodes of hematemesis or hematochezia.  ? ?Hypertension ?-Blood pressure elevated today, averaging up to 601U systolic.  ?-Will restart home blood pressure medicine ? ?Dispo: Admit patient to Observation with expected length of stay less than 2 midnights. ? ?Signed: ?Clydell Hakim, Medical Student ?08/09/2021, 5:06 PM  ?Pager: [phone] ? ?

## 2021-08-10 DIAGNOSIS — R310 Gross hematuria: Secondary | ICD-10-CM | POA: Diagnosis present

## 2021-08-10 DIAGNOSIS — K3184 Gastroparesis: Secondary | ICD-10-CM | POA: Diagnosis present

## 2021-08-10 DIAGNOSIS — K859 Acute pancreatitis without necrosis or infection, unspecified: Secondary | ICD-10-CM | POA: Diagnosis present

## 2021-08-10 DIAGNOSIS — Z9049 Acquired absence of other specified parts of digestive tract: Secondary | ICD-10-CM | POA: Diagnosis not present

## 2021-08-10 DIAGNOSIS — K219 Gastro-esophageal reflux disease without esophagitis: Secondary | ICD-10-CM | POA: Diagnosis present

## 2021-08-10 DIAGNOSIS — Z91120 Patient's intentional underdosing of medication regimen due to financial hardship: Secondary | ICD-10-CM | POA: Diagnosis not present

## 2021-08-10 DIAGNOSIS — G8929 Other chronic pain: Secondary | ICD-10-CM | POA: Diagnosis present

## 2021-08-10 DIAGNOSIS — K921 Melena: Secondary | ICD-10-CM | POA: Diagnosis present

## 2021-08-10 DIAGNOSIS — Z888 Allergy status to other drugs, medicaments and biological substances status: Secondary | ICD-10-CM | POA: Diagnosis not present

## 2021-08-10 DIAGNOSIS — Q453 Other congenital malformations of pancreas and pancreatic duct: Secondary | ICD-10-CM | POA: Diagnosis not present

## 2021-08-10 DIAGNOSIS — Z87442 Personal history of urinary calculi: Secondary | ICD-10-CM | POA: Diagnosis not present

## 2021-08-10 DIAGNOSIS — I10 Essential (primary) hypertension: Secondary | ICD-10-CM | POA: Diagnosis present

## 2021-08-10 DIAGNOSIS — K920 Hematemesis: Secondary | ICD-10-CM | POA: Diagnosis present

## 2021-08-10 DIAGNOSIS — Q600 Renal agenesis, unilateral: Secondary | ICD-10-CM | POA: Diagnosis not present

## 2021-08-10 DIAGNOSIS — K861 Other chronic pancreatitis: Secondary | ICD-10-CM | POA: Diagnosis present

## 2021-08-10 DIAGNOSIS — T475X6A Underdosing of digestants, initial encounter: Secondary | ICD-10-CM | POA: Diagnosis present

## 2021-08-10 DIAGNOSIS — M545 Low back pain, unspecified: Secondary | ICD-10-CM | POA: Diagnosis present

## 2021-08-10 DIAGNOSIS — Z8249 Family history of ischemic heart disease and other diseases of the circulatory system: Secondary | ICD-10-CM | POA: Diagnosis not present

## 2021-08-10 DIAGNOSIS — E7800 Pure hypercholesterolemia, unspecified: Secondary | ICD-10-CM | POA: Diagnosis present

## 2021-08-10 LAB — COMPREHENSIVE METABOLIC PANEL
ALT: 25 U/L (ref 0–44)
AST: 40 U/L (ref 15–41)
Albumin: 3.3 g/dL — ABNORMAL LOW (ref 3.5–5.0)
Alkaline Phosphatase: 48 U/L (ref 38–126)
Anion gap: 10 (ref 5–15)
BUN: 5 mg/dL — ABNORMAL LOW (ref 6–20)
CO2: 23 mmol/L (ref 22–32)
Calcium: 8.9 mg/dL (ref 8.9–10.3)
Chloride: 102 mmol/L (ref 98–111)
Creatinine, Ser: 1.23 mg/dL (ref 0.61–1.24)
GFR, Estimated: >60 mL/min (ref >60)
Glucose, Bld: 104 mg/dL — ABNORMAL HIGH (ref 70–99)
Potassium: 4.3 mmol/L (ref 3.5–5.1)
Sodium: 135 mmol/L (ref 135–145)
Total Bilirubin: 0.9 mg/dL (ref 0.3–1.2)
Total Protein: 6.4 g/dL — ABNORMAL LOW (ref 6.5–8.1)

## 2021-08-10 LAB — CBC
HCT: 36.6 % — ABNORMAL LOW (ref 39.0–52.0)
Hemoglobin: 12.3 g/dL — ABNORMAL LOW (ref 13.0–17.0)
MCH: 34.3 pg — ABNORMAL HIGH (ref 26.0–34.0)
MCHC: 33.6 g/dL (ref 30.0–36.0)
MCV: 101.9 fL — ABNORMAL HIGH (ref 80.0–100.0)
Platelets: 153 10*3/uL (ref 150–400)
RBC: 3.59 MIL/uL — ABNORMAL LOW (ref 4.22–5.81)
RDW: 17.3 % — ABNORMAL HIGH (ref 11.5–15.5)
WBC: 7.6 10*3/uL (ref 4.0–10.5)
nRBC: 0 % (ref 0.0–0.2)

## 2021-08-10 MED ORDER — OXYCODONE HCL 5 MG PO TABS
5.0000 mg | ORAL_TABLET | ORAL | Status: DC | PRN
  Administered 2021-08-11 – 2021-08-12 (×5): 5 mg via ORAL
  Filled 2021-08-10 (×5): qty 1

## 2021-08-10 MED ORDER — RIVAROXABAN 10 MG PO TABS
10.0000 mg | ORAL_TABLET | Freq: Every day | ORAL | Status: DC
  Administered 2021-08-10 – 2021-08-12 (×3): 10 mg via ORAL
  Filled 2021-08-10 (×3): qty 1

## 2021-08-10 MED ORDER — PANCRELIPASE (LIP-PROT-AMYL) 36000-114000 UNITS PO CPEP
72000.0000 [IU] | ORAL_CAPSULE | Freq: Three times a day (TID) | ORAL | Status: DC
  Administered 2021-08-10 – 2021-08-12 (×7): 72000 [IU] via ORAL
  Filled 2021-08-10 (×7): qty 2

## 2021-08-10 MED ORDER — HYDROMORPHONE HCL 1 MG/ML IJ SOLN
1.0000 mg | INTRAMUSCULAR | Status: DC | PRN
  Administered 2021-08-10 – 2021-08-11 (×5): 1 mg via INTRAVENOUS
  Filled 2021-08-10 (×8): qty 1

## 2021-08-10 NOTE — Progress Notes (Signed)
? ?Subjective: ?Kyle Berry is a 51 y.o. M with PMH significant for chronic pancreatitis, pancreatic divisum, hypertension and GERD admitted for chronic pancreatitis. No acute overnight events. On rounds this morning, patient states his pain is much improved and he is able to tolerate PO intake however needs his Dilaudid injection prior to eating due to the pain. He had one bowel movement this morning that was non-bloody. He denies any vomiting.  ? ?Objective: ? ?Vital signs in last 24 hours: ?Vitals:  ? 08/09/21 2300 08/10/21 0331 08/10/21 0935 08/10/21 0936  ?BP: (!) 151/110 (!) 147/92 (!) 170/104 (!) 171/88  ?Pulse: 70 71 78 70  ?Resp: '14 17 18   '$ ?Temp: 97.8 ?F (36.6 ?C) (!) 97.5 ?F (36.4 ?C) 98.1 ?F (36.7 ?C)   ?TempSrc: Oral Oral Oral   ?SpO2: 96% 99% 98%   ?Weight:  72.2 kg    ?Height:  '6\' 1"'$  (1.854 m)    ? ?Weight change:  ? ?Intake/Output Summary (Last 24 hours) at 08/10/2021 1336 ?Last data filed at 08/10/2021 0900 ?Gross per 24 hour  ?Intake 1361.18 ml  ?Output 0 ml  ?Net 1361.18 ml  ? ?CBC ?   ?Component Value Date/Time  ? WBC 7.6 08/10/2021 0318  ? RBC 3.59 (L) 08/10/2021 0318  ? HGB 12.3 (L) 08/10/2021 0318  ? HCT 36.6 (L) 08/10/2021 0318  ? PLT 153 08/10/2021 0318  ? MCV 101.9 (H) 08/10/2021 0318  ? MCH 34.3 (H) 08/10/2021 0318  ? MCHC 33.6 08/10/2021 0318  ? RDW 17.3 (H) 08/10/2021 0318  ? LYMPHSABS 1.7 06/21/2021 1540  ? MONOABS 0.4 06/21/2021 1540  ? EOSABS 0.1 06/21/2021 1540  ? BASOSABS 0.0 06/21/2021 1540  ?  ? ?Physical Exam ?Constitutional:   ?   General: He is not in acute distress. ?   Appearance: He is well-developed.  ?HENT:  ?   Head: Normocephalic.  ?Abdominal:  ?   General: Abdomen is flat. There is no distension.  ?   Palpations: Abdomen is soft.  ?   Tenderness: There is abdominal tenderness in the left upper quadrant.  ?Skin: ?   General: Skin is warm and dry.  ?Neurological:  ?   Mental Status: He is alert.  ?Psychiatric:     ?   Mood and Affect: Mood normal.     ?   Behavior:  Behavior normal.  ?  ? ?Assessment/Plan: ? ?Active Problems: ?  Acute on chronic pancreatitis ([place]) ?  Congenital single kidney ?  Pancreatic insufficiency ?  Acute pancreatitis ? ?Patient is a 51 y.o. M with PMH significant for acute on chronic pancreatitis, HTN, and GERD who was admitted for x2 weeks of progressively worsening abdominal pain with nausea and vomiting, limiting his PO intake. Episode feels similar to episodes of pancreatitis in the past.  ? ?Acute on chronic pancreatitis ?Pancreatic divisum ?-Current episode of abdominal pain, nausea, and vomiting feels similar to prior episodes. Patient has run out of his Creon and has not been able to obtain his medications for the past several months.  ?-Patient has been started on moderate fluid resuscitation as well as IV Dilaudid for pain control and and Phenergan for nausea.  ?-Patient has been able to tolerate PO intake however has required IV Dilaudid to tolerate eating due to the pain.  ?-Will transition patient to oral oxycodone for pain management and discontinue IV fluids.  ?-Continue regular diet and fluids as tolerated.  ?-Will attempt to reach out to [facility] where patient filled out  forms for assistance to check on the status of his application to obtain Creon ? ?Hematochezia ?Hematemesis  ?-No recurrent episodes of hematochezia or hematemesis  ?-Drop in hemoglobin and hematocrit likely dilutional ?-Will continue to monitor over the course of his stay ? ?Hypertension ?-Blood pressure elevated. He is currently on his home BP medication.  ?-Will reassess and adjust his medication as needed after better pain control.  ? ? LOS: 0 days  ? ?Kyle Berry, Medical Student ?08/10/2021, 1:36 PM ? ?Pager number: [phone] ? ?

## 2021-08-11 DIAGNOSIS — K859 Acute pancreatitis without necrosis or infection, unspecified: Secondary | ICD-10-CM | POA: Diagnosis not present

## 2021-08-11 LAB — BASIC METABOLIC PANEL
Anion gap: 8 (ref 5–15)
BUN: <5 mg/dL — ABNORMAL LOW (ref 6–20)
CO2: 26 mmol/L (ref 22–32)
Calcium: 9.6 mg/dL (ref 8.9–10.3)
Chloride: 100 mmol/L (ref 98–111)
Creatinine, Ser: 1.07 mg/dL (ref 0.61–1.24)
GFR, Estimated: >60 mL/min (ref >60)
Glucose, Bld: 113 mg/dL — ABNORMAL HIGH (ref 70–99)
Potassium: 3.7 mmol/L (ref 3.5–5.1)
Sodium: 134 mmol/L — ABNORMAL LOW (ref 135–145)

## 2021-08-11 LAB — URINALYSIS, ROUTINE W REFLEX MICROSCOPIC
Bilirubin Urine: NEGATIVE
Glucose, UA: NEGATIVE mg/dL
Ketones, ur: NEGATIVE mg/dL
Leukocytes,Ua: NEGATIVE
Nitrite: NEGATIVE
Protein, ur: 100 mg/dL — AB
RBC / HPF: >50 RBC/hpf — ABNORMAL HIGH (ref 0–5)
Specific Gravity, Urine: 1.013 (ref 1.005–1.030)
pH: 6 (ref 5.0–8.0)

## 2021-08-11 LAB — CBC
HCT: 38.1 % — ABNORMAL LOW (ref 39.0–52.0)
Hemoglobin: 12.7 g/dL — ABNORMAL LOW (ref 13.0–17.0)
MCH: 33.7 pg (ref 26.0–34.0)
MCHC: 33.3 g/dL (ref 30.0–36.0)
MCV: 101.1 fL — ABNORMAL HIGH (ref 80.0–100.0)
Platelets: 162 10*3/uL (ref 150–400)
RBC: 3.77 MIL/uL — ABNORMAL LOW (ref 4.22–5.81)
RDW: 17.4 % — ABNORMAL HIGH (ref 11.5–15.5)
WBC: 6.1 10*3/uL (ref 4.0–10.5)
nRBC: 0 % (ref 0.0–0.2)

## 2021-08-11 NOTE — Progress Notes (Signed)
?  Transition of Care (TOC) Screening Note ? ? ?Patient Details  ?Name: Kyle Berry ?Date of Birth: November 21, 1970 ? ? ?Transition of Care (TOC) CM/SW Contact:    ?Tom-Johnson, Renea Ee, RN ?Phone Number: ?08/11/2021, 3:22 PM ? ?Patient is admitted for Acute on Chronic Pancreatitis.  ?Transition of Care Department [facility]) has reviewed patient and no TOC needs or recommendations have been identified at this time. TOC will continue to monitor patient advancement through interdisciplinary progression rounds. If new patient transition needs arise, please place a TOC consult. ?  ?

## 2021-08-11 NOTE — Progress Notes (Signed)
Pt. Stated a concern about his urine with blood tinged, pt. Described it as frank red, however forgot to save urine and flushed it. Md notified. ? Another bloody urine with a bit of clots according to patient, forgot and did not save the urine. Will notify MD. ?

## 2021-08-11 NOTE — Progress Notes (Signed)
? ?Subjective: ?Mr. Yusuf is a 51 y.o. male with PMH significant for chronic pancreatitis, pancreatic divisum, GERD, and HTN who was admitted for acute on chronic pancreatitis. On morning rounds this morning patient states he is overall feeling well, however did have one episode of pain radiating to his back early this morning which resolved after taking some Dilaudid. He also had one episode of vomiting this morning after eating breakfast however no blood was noted in the vomit. He states the nausea is overall improved with the Compazine and his pain is tolerable with the IV Dilaudid and oxycodone. He had one bowel movement and denies any hematochezia. ? ?Objective: ? ?Vital signs in last 24 hours: ?Vitals:  ? 08/10/21 1638 08/10/21 2051 08/11/21 0534 08/11/21 0921  ?BP: (!) 156/96 (!) 160/92 (!) 128/107 (!) 137/97  ?Pulse: 70 69 78 74  ?Resp: '18 18 18 17  '$ ?Temp: 98 ?F (36.7 ?C) 98 ?F (36.7 ?C) 98.6 ?F (37 ?C) 98.2 ?F (36.8 ?C)  ?TempSrc: Oral Oral Oral   ?SpO2: 98% 99% 95% 95%  ?Weight:      ?Height:      ? ?Weight change:  ? ?Intake/Output Summary (Last 24 hours) at 08/11/2021 1131 ?Last data filed at 08/11/2021 0800 ?Gross per 24 hour  ?Intake 1600 ml  ?Output 0 ml  ?Net 1600 ml  ? ?CBC ?   ?Component Value Date/Time  ? WBC 6.1 08/11/2021 0437  ? RBC 3.77 (L) 08/11/2021 0437  ? HGB 12.7 (L) 08/11/2021 0437  ? HCT 38.1 (L) 08/11/2021 0437  ? PLT 162 08/11/2021 0437  ? MCV 101.1 (H) 08/11/2021 0437  ? MCH 33.7 08/11/2021 0437  ? MCHC 33.3 08/11/2021 0437  ? RDW 17.4 (H) 08/11/2021 0437  ? LYMPHSABS 1.7 06/21/2021 1540  ? MONOABS 0.4 06/21/2021 1540  ? EOSABS 0.1 06/21/2021 1540  ? BASOSABS 0.0 06/21/2021 1540  ?  ? ?Physical Exam ?Constitutional:   ?   Appearance: He is well-developed.  ?HENT:  ?   Head: Normocephalic.  ?Cardiovascular:  ?   Rate and Rhythm: Normal rate and regular rhythm.  ?Pulmonary:  ?   Effort: Pulmonary effort is normal.  ?   Breath sounds: Normal breath sounds.  ?Abdominal:  ?   General:  Abdomen is flat.  ?   Palpations: Abdomen is soft.  ?   Tenderness: There is abdominal tenderness in the left upper quadrant.  ?Neurological:  ?   Mental Status: He is alert.  ?Psychiatric:     ?   Mood and Affect: Mood normal.     ?   Behavior: Behavior normal.  ?  ?Assessment/Plan: ? ?Active Problems: ?  Acute on chronic pancreatitis ([place]) ?  Congenital single kidney ?  Pancreatic insufficiency ?  Acute pancreatitis ? ?Patient is a 51 y.o. male with PMH significant for chronic pancreatitis, GERD, and HTN who was admitted for acute on chronic pancreatitis.  ? ?Acute on chronic pancreatitis ?Patient's pain has been well managed with IV Dilaudid and oxycodone. He has been able to tolerate PO intake however did have one episode of vomiting this morning.  ?Called [employer] yesterday where patient filed for assistance to obtain his Creon and was notified that his application was denied due to his co-pay. Discussed this with the patient and will continue seeking alternative options to better manage his chronic pancreatitis.   ?-Will continue Creon TID with meals ?-Continue oral hydration ?-IV Dilaudid and oxycodone as needed to manage the pain ?-Continue Compazine for nausea ? ?  Hypertension ?Blood pressure down trending with improvement in pain. Will continue home dose of irbesartan and monitor.  ? ? LOS: 1 day  ? ?Clydell Hakim, Medical Student ?08/11/2021, 11:31 AM ? ?Pager number: [phone] ? ?

## 2021-08-12 LAB — BASIC METABOLIC PANEL
Anion gap: 9 (ref 5–15)
BUN: 5 mg/dL — ABNORMAL LOW (ref 6–20)
CO2: 28 mmol/L (ref 22–32)
Calcium: 9.5 mg/dL (ref 8.9–10.3)
Chloride: 98 mmol/L (ref 98–111)
Creatinine, Ser: 1.1 mg/dL (ref 0.61–1.24)
GFR, Estimated: >60 mL/min (ref >60)
Glucose, Bld: 148 mg/dL — ABNORMAL HIGH (ref 70–99)
Potassium: 3.3 mmol/L — ABNORMAL LOW (ref 3.5–5.1)
Sodium: 135 mmol/L (ref 135–145)

## 2021-08-12 LAB — CBC
HCT: 35.8 % — ABNORMAL LOW (ref 39.0–52.0)
Hemoglobin: 12.2 g/dL — ABNORMAL LOW (ref 13.0–17.0)
MCH: 34.4 pg — ABNORMAL HIGH (ref 26.0–34.0)
MCHC: 34.1 g/dL (ref 30.0–36.0)
MCV: 100.8 fL — ABNORMAL HIGH (ref 80.0–100.0)
Platelets: 155 10*3/uL (ref 150–400)
RBC: 3.55 MIL/uL — ABNORMAL LOW (ref 4.22–5.81)
RDW: 17.4 % — ABNORMAL HIGH (ref 11.5–15.5)
WBC: 5.3 10*3/uL (ref 4.0–10.5)
nRBC: 0 % (ref 0.0–0.2)

## 2021-08-12 LAB — MAGNESIUM: Magnesium: 1.6 mg/dL — ABNORMAL LOW (ref 1.7–2.4)

## 2021-08-12 MED ORDER — PANCRELIPASE (LIP-PROT-AMYL) 36000-114000 UNITS PO CPEP: 72000.0000 [IU] | ORAL_CAPSULE | Freq: Three times a day (TID) | ORAL | 0 refills | Status: DC

## 2021-08-12 MED ORDER — OXYCODONE HCL 5 MG PO TABS: 5.0000 mg | ORAL_TABLET | Freq: Four times a day (QID) | ORAL | 0 refills | Status: AC | PRN

## 2021-08-12 MED ORDER — MAGNESIUM SULFATE 2 GM/50ML IV SOLN
2.0000 g | Freq: Once | INTRAVENOUS | Status: DC
  Filled 2021-08-12: qty 50

## 2021-08-12 MED ORDER — MAGNESIUM OXIDE -MG SUPPLEMENT 400 (240 MG) MG PO TABS
200.0000 mg | ORAL_TABLET | Freq: Once | ORAL | Status: AC
  Administered 2021-08-12: 200 mg via ORAL
  Filled 2021-08-12: qty 1

## 2021-08-12 MED ORDER — POTASSIUM CHLORIDE 20 MEQ PO PACK
40.0000 meq | PACK | Freq: Two times a day (BID) | ORAL | Status: DC
  Administered 2021-08-12: 40 meq via ORAL
  Filled 2021-08-12: qty 2

## 2021-08-12 NOTE — TOC Transition Note (Signed)
Transition of Care (TOC) - CM/SW Discharge Note ? ? ?Patient Details  ?Name: Kyle Berry ?MRN: [id number] ?Date of Birth: [birth year] ? ?Transition of Care (TOC) CM/SW Contact:  ?Bartholomew Crews, RN ?Phone Number: [phone] ?08/12/2021, 12:36 PM ? ? ?Clinical Narrative:    ? ?Acknowledging TOC consult for medication assistance. Spoke with patient on hospital room phone to discuss medication Creon. Patient stated that when he was in ED that one of the doctors was able to find out that his application with [employer] had been denied because his copay would be too high. RNCM unsure of details for this information. Discussed with patient creating account at [email or web address]. Patient verbalized having received the email. Discussed that he has spoken with his insurance compancy who could not tell him source of previous patient assistance. Patient stated that one of his doctors had initiated the assistance, but after a year the assistance ran out. Advised that assistance usually does require updating on an annual basis. Patient verbalized understanding and will follow up with resources this week. No further TOC needs identified at this time.  ? ?Final next level of care: Home/Self Care ?Barriers to Discharge: No Barriers Identified ? ? ?Patient Goals and CMS Choice ?Patient states their goals for this hospitalization and ongoing recovery are:: return home ?CMS Medicare.gov Compare Post Acute Care list provided to:: Patient ?Choice offered to / list presented to : NA ? ?Discharge Placement ?  ?           ?  ?  ?  ?  ? ?Discharge Plan and Services ?  ?  ?           ?DME Arranged: N/A ?DME Agency: NA ?  ?  ?  ?HH Arranged: NA ?[place] Agency: NA ?  ?  ?  ? ?Social Determinants of Health (SDOH) Interventions ?  ? ? ?Readmission Risk Interventions ? ?  11/03/2020  ?  2:19 PM  ?Readmission Risk Prevention Plan  ?Transportation Screening Complete  ?PCP or Specialist Appt within 3-5 Days Complete  ?[place] or Home  Care Consult Complete  ?Social Work Consult for [place] Planning/Counseling Complete  ?Palliative Care Screening Not Applicable  ? ? ? ? ? ?

## 2021-08-12 NOTE — Discharge Summary (Signed)
? ?Name: Kyle Berry ?MRN: [id number] ?DOB: Feb 16, 1971 51 y.o. ?PCP: [name] (Inactive) ? ?Date of Admission: 08/09/2021  7:46 AM ?Date of Discharge: 08/12/2021  1:37 PM ?Attending Physician: Velna Ochs, MD ? ?Discharge Diagnosis: ?1. Acute on chronic pancreatitis ?2. Hypertension ?3. Gross hematuria ? ?Discharge Medications: ?Allergies as of 08/12/2021   ? ?   Reactions  ? Diclofenac Hives  ? Norvasc [amlodipine Besylate] Other (See Comments)  ? Fluid buildup in chest  ? Omeprazole Other (See Comments)  ? MD stopped due to pancreatitis  ? Prednisone Other (See Comments)  ? Mood swings  ? ?  ? ?  ?Medication List  ?  ? ?STOP taking these medications   ? ?oxyCODONE-acetaminophen 5-325 MG tablet ?Commonly known as: PERCOCET/ROXICET ?  ? ?  ? ?TAKE these medications   ? ?acetaminophen 325 MG tablet ?Commonly known as: TYLENOL ?Take 650 mg by mouth every 6 (six) hours as needed. ?  ?famotidine 20 MG tablet ?Commonly known as: PEPCID ?Take 1 tablet (20 mg total) by mouth 2 (two) times daily. Instructions: ?Take Pepcid 20 mg twice daily x30 days, then take Pepcid 20 mg daily thereafter. ?What changed:  ?when to take this ?reasons to take this ?additional instructions ?  ?feeding supplement Liqd ?Take 237 mLs by mouth 2 (two) times daily between meals. ?  ?lipase/protease/amylase 36000 UNITS Cpep capsule ?Commonly known as: CREON ?Take 2 capsules (72,000 Units total) by mouth 3 (three) times daily with meals. ?  ?oxyCODONE 5 MG immediate release tablet ?Commonly known as: Oxy IR/ROXICODONE ?Take 1 tablet (5 mg total) by mouth every 6 (six) hours as needed for up to 3 days for moderate pain. ?  ?promethazine 25 MG tablet ?Commonly known as: PHENERGAN ?Take 1 tablet (25 mg total) by mouth every 6 (six) hours as needed for nausea or vomiting. ?  ?telmisartan 40 MG tablet ?Commonly known as: MICARDIS ?Take 1 tablet (40 mg total) by mouth daily. ?  ?VISINE-A OP ?Place 1 drop into both eyes daily as needed  (red eyes). ?  ? ?  ? ? ?Disposition and follow-up:   ?Mr.[name] was discharged from [facility] in Stable condition.  At the hospital follow up visit please address: ? ?1. Acute on chronic pancreatitis: Pt to follow-up with [facility], would benefit from either medication assistance or alternative medication besides Creon. Please ensure pain has resolved. ? ?2. Hypertension: BP still elevated during admission while on ARB. Would consider addition of additional antihypertensive if elevated at follow-up. ? ?3. Gross hematuria: Thought to be related to Xarelto. Please ensure resolution. If persists, could benefit from urology referral. ? ?4.  Labs / imaging needed at time of follow-up: BMP, Mg ? ?5.  Pending labs/ test needing follow-up: n/a ? ?Follow-up Appointments: ?  ? Follow-up Information   ? ? [name], [name] Follow up in 1 week(s).   ?Contact information: ?[place] [facility] ?[address] ?[place] [address] ?[phone] ? ? ?  ?  ? ? Skeet Latch, MD .   ?Specialty: Cardiology ?Contact information: ?[address] ?[address] ?[place] [address] ?[phone] ? ? ?  ?  ? ? [facility], [facility] up.   ?Why: Please make sure to follow-up with your GI physician at [facility]. ?Contact information: ?[address] ?[place] [address] ?[phone] ? ? ?  ?  ? ?  ?  ? ?  ?  ? ?Hospital Course by problem  list: ?Acute on chronic pancreatitis : ?Patient arrived to [facility] with one week history of abdominal pain, similar to his previous bouts of acute pancreatitis. He is supposed to be on Creon for pancreatic insufficiency 2/2 pancreatic divisum, but has not been taking this due to the preposterous cost of the medication. Lab work in ED revealed elevated lipase and CT revealed evidence of acute pancreatitis. He was subsequently admitted for further management. He was started on intravenous fluids and given anti-emetic and pain medications.  Over the next few days, patient began to eat more and his pain started to subside. On day of discharge, he was able to tolerate PO well without issues. He is to follow-up with GI at [facility] for potential alternatives to Creon or further medication assistance. He is to also follow-up with PCP in the next 1-2 weeks. ? ?2. Hypertension: ?Patient's blood pressure was elevated during his admission while on home dose of irbesartan. No evidence of end-organ damage. Patient to follow-up with his PCP to maintain adequate control of his blood pressure.  ? ?3.  Hematuria: ?Patient had episodes of gross hematuria during his admission. UA showed moderate amount of Hgb. Likely from anticoagulation therapy on Xarelto. Discussed this with the patient and his hematuria will likely resolve once we stop the Xarelto at time of discharge. Additionally discussed that if the hematuria persists, patient should follow-up with his PCP for a repeat UA and possibly be referred to urology to evaluate for/rule out underlying pathology  ? ?Discharge Exam:   ?BP (!) 146/97   Pulse 95   Temp 98.9 ?F (37.2 ?C) (Oral)   Resp 16   Ht '6\' 1"'$  (1.854 m)   Wt 72.2 kg   SpO2 100%   BMI 21.00 kg/m?  ?Discharge exam:  ?General: Resting comfortably in no acute distress ?CV: Regular rate, rhythm. No murmurs appreciated. ?Pulm: Normal respiratory effort on room air. Clear to ausculation bilaterally. ?GI: Abd soft, non-distended, mild epigastric tenderness. Normoactive bowel sounds. ?MSK: Normal bulk, tone. No pitting edema bilaterally. ?Neuro: Awake, alert, conversing appropriately.  ?Psych: Normal mood, affect, speech. ? ?Pertinent Labs, Studies, and Procedures:  ? ?  Latest Ref Rng & Units 08/12/2021  ?  3:09 AM 08/11/2021  ?  4:37 AM 08/10/2021  ?  3:18 AM  ?CBC  ?WBC 4.0 - 10.5 K/uL 5.3   6.1   7.6    ?Hemoglobin 13.0 - 17.0 g/dL 12.2   12.7   12.3    ?Hematocrit 39.0 - 52.0 % 35.8   38.1   36.6    ?Platelets 150 - 400 K/uL 155   162   153    ?  ? ?  Latest  Ref Rng & Units 08/12/2021  ?  3:09 AM 08/11/2021  ?  4:37 AM 08/10/2021  ?  3:18 AM  ?BMP  ?Glucose 70 - 99 mg/dL 148   113   104    ?BUN 6 - 20 mg/dL 5   <5   5    ?Creatinine 0.61 - 1.24 mg/dL 1.10   1.07   1.23    ?Sodium 135 - 145 mmol/L 135   134   135    ?Potassium 3.5 - 5.1 mmol/L 3.3   3.7   4.3    ?Chloride 98 - 111 mmol/L 98   100   102    ?CO2 22 - 32 mmol/L '28   26   23    '$ ?Calcium 8.9 - 10.3 mg/dL 9.5  9.6   8.9    ?  ?CT abdomen/pelvis 08/09/2021 ?-Lower chest: No acute abnormality. Bandlike scarring of the bilateral lung bases ?-Hepatobiliary: No focal liver abnormality is seen. Hepatic steatosis. Status post cholecystectomy. No biliary dilatation. ?-Pancreas: Similar appearance of diffuse peripancreatic fat stranding and fluid. ?-Spleen: Normal in size without significant abnormality. ? -Adrenals/Urinary Tract: Adrenal glands are unremarkable. 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 The left kidney is normal, without renal calculi, solid lesion, or hydronephrosis. Bladder is unremarkable. ?-Stomach/Bowel: Stomach is within normal limits. Appendix appears normal. No evidence of bowel wall thickening, distention, or inflammatory changes. ?-Vascular/Lymphatic: No significant vascular findings are present. No enlarged abdominal or pelvic lymph nodes. ?-Reproductive: No mass or other significant abnormality. ?-Other: No abdominal wall hernia or abnormality. No ascites. ?-Musculoskeletal: No acute or significant osseous findings. ?  ?IMPRESSION: ?1. Similar appearance of diffuse peripancreatic fat stranding and fluid, consistent with acute pancreatitis. No evidence of acute pancreatic fluid collection or pancreatic necrosis. ?2. Hepatic steatosis. ?3. Status post cholecystectomy. ?4. 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 ? ?Discharge Instructions: ?Discharge Instructions   ? ? Call MD for:  difficulty breathing, headache or visual disturbances   Complete by: As  directed ?  ? Call MD for:  extreme fatigue   Complete by: As directed ?  ? Call MD for:  hives   Complete by: As directed ?  ? Call MD for:  persistant dizziness or light-headedness   Complete by: As directed ?

## 2021-09-06 ENCOUNTER — Emergency Department (HOSPITAL_COMMUNITY)
Admission: EM | Admit: 2021-09-06 | Discharge: 2021-09-07 | Disposition: A | Discharge status: Home / Self Care | Payer: Managed Care, Other (non HMO) | Attending: Emergency Medicine | Admitting: Emergency Medicine

## 2021-09-06 ENCOUNTER — Other Ambulatory Visit: Payer: Self-pay

## 2021-09-06 ENCOUNTER — Encounter (HOSPITAL_COMMUNITY): Payer: Self-pay | Admitting: Emergency Medicine

## 2021-09-06 DIAGNOSIS — K649 Unspecified hemorrhoids: Secondary | ICD-10-CM | POA: Insufficient documentation

## 2021-09-06 DIAGNOSIS — I10 Essential (primary) hypertension: Secondary | ICD-10-CM | POA: Diagnosis not present

## 2021-09-06 DIAGNOSIS — Z79899 Other long term (current) drug therapy: Secondary | ICD-10-CM | POA: Diagnosis not present

## 2021-09-06 DIAGNOSIS — R1010 Upper abdominal pain, unspecified: Secondary | ICD-10-CM

## 2021-09-06 DIAGNOSIS — E876 Hypokalemia: Secondary | ICD-10-CM | POA: Diagnosis not present

## 2021-09-06 DIAGNOSIS — R1013 Epigastric pain: Secondary | ICD-10-CM | POA: Insufficient documentation

## 2021-09-06 DIAGNOSIS — R7401 Elevation of levels of liver transaminase levels: Secondary | ICD-10-CM | POA: Diagnosis not present

## 2021-09-06 LAB — COMPREHENSIVE METABOLIC PANEL
ALT: 33 U/L (ref 0–44)
AST: 107 U/L — ABNORMAL HIGH (ref 15–41)
Albumin: 3.8 g/dL (ref 3.5–5.0)
Alkaline Phosphatase: 59 U/L (ref 38–126)
Anion gap: 9 (ref 5–15)
BUN: 6 mg/dL (ref 6–20)
CO2: 21 mmol/L — ABNORMAL LOW (ref 22–32)
Calcium: 8.4 mg/dL — ABNORMAL LOW (ref 8.9–10.3)
Chloride: 108 mmol/L (ref 98–111)
Creatinine, Ser: 1.11 mg/dL (ref 0.61–1.24)
GFR, Estimated: >60 mL/min (ref >60)
Glucose, Bld: 117 mg/dL — ABNORMAL HIGH (ref 70–99)
Potassium: 2.9 mmol/L — ABNORMAL LOW (ref 3.5–5.1)
Sodium: 138 mmol/L (ref 135–145)
Total Bilirubin: 1.2 mg/dL (ref 0.3–1.2)
Total Protein: 6.8 g/dL (ref 6.5–8.1)

## 2021-09-06 LAB — CBC
HCT: 38.5 % — ABNORMAL LOW (ref 39.0–52.0)
Hemoglobin: 13.1 g/dL (ref 13.0–17.0)
MCH: 34 pg (ref 26.0–34.0)
MCHC: 34 g/dL (ref 30.0–36.0)
MCV: 100 fL (ref 80.0–100.0)
Platelets: 186 10*3/uL (ref 150–400)
RBC: 3.85 MIL/uL — ABNORMAL LOW (ref 4.22–5.81)
RDW: 17.2 % — ABNORMAL HIGH (ref 11.5–15.5)
WBC: 5.2 10*3/uL (ref 4.0–10.5)
nRBC: 0 % (ref 0.0–0.2)

## 2021-09-06 LAB — URINALYSIS, ROUTINE W REFLEX MICROSCOPIC
Bilirubin Urine: NEGATIVE
Glucose, UA: NEGATIVE mg/dL
Ketones, ur: NEGATIVE mg/dL
Nitrite: NEGATIVE
Protein, ur: 30 mg/dL — AB
Specific Gravity, Urine: 1.023 (ref 1.005–1.030)
pH: 5 (ref 5.0–8.0)

## 2021-09-06 LAB — LIPASE, BLOOD: Lipase: 28 U/L (ref 11–51)

## 2021-09-06 LAB — POC OCCULT BLOOD, ED: Fecal Occult Bld: NEGATIVE

## 2021-09-06 MED ORDER — FAMOTIDINE IN NACL 20-0.9 MG/50ML-% IV SOLN
20.0000 mg | Freq: Once | INTRAVENOUS | Status: AC
  Administered 2021-09-07: 20 mg via INTRAVENOUS
  Filled 2021-09-06: qty 50

## 2021-09-06 MED ORDER — ONDANSETRON HCL 4 MG/2ML IJ SOLN
4.0000 mg | Freq: Once | INTRAMUSCULAR | Status: AC
  Administered 2021-09-06: 4 mg via INTRAVENOUS
  Filled 2021-09-06: qty 2

## 2021-09-06 MED ORDER — LACTATED RINGERS IV BOLUS
1500.0000 mL | Freq: Once | INTRAVENOUS | Status: AC
  Administered 2021-09-06: 1500 mL via INTRAVENOUS

## 2021-09-06 MED ORDER — POTASSIUM CHLORIDE 10 MEQ/100ML IV SOLN
10.0000 meq | Freq: Once | INTRAVENOUS | Status: AC
Start: 2021-09-07 — End: 2021-09-07
  Administered 2021-09-07: 10 meq via INTRAVENOUS
  Filled 2021-09-06: qty 100

## 2021-09-06 MED ORDER — HYDROMORPHONE HCL 1 MG/ML IJ SOLN
0.5000 mg | Freq: Once | INTRAMUSCULAR | Status: AC
  Administered 2021-09-06: 0.5 mg via INTRAVENOUS
  Filled 2021-09-06: qty 1

## 2021-09-06 MED ORDER — HYDROMORPHONE HCL 1 MG/ML IJ SOLN
1.0000 mg | Freq: Once | INTRAMUSCULAR | Status: AC
  Administered 2021-09-07: 1 mg via INTRAVENOUS
  Filled 2021-09-06: qty 1

## 2021-09-06 NOTE — ED Triage Notes (Addendum)
Patient here complaining of bloody emesis and diarrhea for about a week now, with complaints of upper abdominal pain radiating to the back. Patient thought it was his pancreatitis, but the emesis and diarrhea has not subsided. Pt recently seen for pancreatitis flair with emesis and diarrhea but was discharged and now he is concerned due to the bloody diarrhea and emesis.

## 2021-09-06 NOTE — ED Provider Triage Note (Signed)
Emergency Medicine Provider Triage Evaluation Note  Kyle Berry , a 51 y.o. male  was evaluated in triage.  Pt complains of back pain, stomach pain, vomiting blood and bloody bowel movements. No fevers does have chills.  No trauma.     Physical Exam  BP (!) 146/104   Pulse 98   Temp 99.3 F (37.4 C) (Oral)   Resp 16   Ht '6\' 1"'$  (1.854 m)   Wt 75.8 kg   SpO2 97%   BMI 22.03 kg/m  Gen:   Awake, no distress   Resp:  Normal effort  MSK:   Moves extremities without difficulty  Other:  Normal speech.   Medical Decision Making  Medically screening exam initiated at 9:16 PM.  Appropriate orders placed.  [name] was informed that the remainder of the evaluation will be completed by another provider, this initial triage assessment does not replace that evaluation, and the importance of remaining in the ED until their evaluation is complete.  No blood thinners.    Lorin Glass, [place] 09/06/21 2118

## 2021-09-06 NOTE — ED Provider Notes (Signed)
[facility] EMERGENCY DEPARTMENT Provider Note   CSN: [id number] Arrival date & time: 09/06/21  2007     History {Add pertinent medical, surgical, social history, OB history to HPI:1} Chief Complaint  Patient presents with   Diarrhea   Emesis   Abdominal Pain    Kyle Berry is a 51 y.o. male with a history of hypertension, hypercholesterolemia, recurrent pancreatitis, GERD, colonic ulcer, and prior cholecystectomy who presents to the ED with complaints of N/V/D and abdominal pain. Patient reports that his pain improved during his recent hospital admission but did not completely resolve, has persisted and started to get progressively worse over the past 2 weeks. Pain is in the upper abdomen, through to the back, feels like his acute on chronic pancreatitis pain. No alleviating/aggravating factors. Not currently taking creon due to cost. Has been having diarrhea with blood pressure x 3 weeks, was having hematuria during hospitalization- this has improved but not yet resolved, started with blood in his stool as the blood in his urine improved. Reports bright red to maroon, not dark or tarry, variable amount with each BM. Has had bright red blood in emesis x 1 week. Has felt lightheaded/fatigued. Denies fever, melena, dysuria, chest pain, dyspnea, or syncope. Denies NSAID use, EtOH use, or anticoagulation.      HPI     Home Medications Prior to Admission medications   Medication Sig Start Date End Date Taking? Authorizing Provider  acetaminophen (TYLENOL) 325 MG tablet Take 650 mg by mouth every 6 (six) hours as needed.    [provider]  famotidine (PEPCID) 20 MG tablet Take 1 tablet (20 mg total) by mouth 2 (two) times daily. Instructions: Take Pepcid 20 mg twice daily x30 days, then take Pepcid 20 mg daily thereafter. Patient taking differently: Take 20 mg by mouth as needed. 11/03/20 08/19/21  Kayleen Memos, DO  feeding supplement (ENSURE ENLIVE /  ENSURE PLUS) LIQD Take 237 mLs by mouth 2 (two) times daily between meals. 02/04/21   Terrilee Croak, MD  lipase/protease/amylase (CREON) 36000 UNITS CPEP capsule Take 2 capsules (72,000 Units total) by mouth 3 (three) times daily with meals. 08/12/21   Sanjuan Dame, MD  Naphazoline-Pheniramine (VISINE-A OP) Place 1 drop into both eyes daily as needed (red eyes).    [provider]  promethazine (PHENERGAN) 25 MG tablet Take 1 tablet (25 mg total) by mouth every 6 (six) hours as needed for nausea or vomiting. 06/24/21   Geradine Girt, DO  telmisartan (MICARDIS) 40 MG tablet Take 1 tablet (40 mg total) by mouth daily. 04/26/21 08/18/21  Idamae Schuller, MD      Allergies    Diclofenac, Norvasc [amlodipine besylate], Omeprazole, and Prednisone    Review of Systems   Review of Systems  Constitutional:  Positive for fatigue. Negative for fever.  Respiratory:  Negative for shortness of breath.   Cardiovascular:  Negative for chest pain.  Gastrointestinal:  Positive for abdominal pain, blood in stool, diarrhea, nausea and vomiting.  Genitourinary:  Positive for hematuria. Negative for dysuria.  Neurological:  Positive for light-headedness. Negative for syncope.  All other systems reviewed and are negative.  Physical Exam Updated Vital Signs BP (!) 146/104   Pulse 98   Temp 99.3 F (37.4 C) (Oral)   Resp 16   Ht '6\' 1"'$  (1.854 m)   Wt 75.8 kg   SpO2 97%   BMI 22.03 kg/m  Physical Exam Vitals and nursing note reviewed.  Constitutional:  General: He is not in acute distress.    Appearance: He is well-developed. He is not toxic-appearing.  HENT:     Head: Normocephalic and atraumatic.  Eyes:     General:        Right eye: No discharge.        Left eye: No discharge.     Conjunctiva/sclera: Conjunctivae normal.  Cardiovascular:     Rate and Rhythm: Normal rate and regular rhythm.  Pulmonary:     Effort: No respiratory distress.     Breath sounds: Normal breath sounds. No  wheezing or rales.  Abdominal:     General: There is no distension.     Palpations: Abdomen is soft.     Tenderness: There is abdominal tenderness in the right upper quadrant, epigastric area and left upper quadrant. There is no rebound.  Genitourinary:    Comments: NT present as chaperone.  Small nonthrombosed hemorrhoid present, no active bleeding. DRE with soft brown stool, fecal occult negative.  Musculoskeletal:     Cervical back: Neck supple.  Skin:    General: Skin is warm and dry.  Neurological:     Mental Status: He is alert.     Comments: Clear speech.   Psychiatric:        Behavior: Behavior normal.    ED Results / Procedures / Treatments   Labs (all labs ordered are listed, but only abnormal results are displayed) Labs Reviewed  CBC - Abnormal; Notable for the following components:      Result Value   RBC 3.85 (*)    HCT 38.5 (*)    RDW 17.2 (*)    All other components within normal limits  LIPASE, BLOOD  COMPREHENSIVE METABOLIC PANEL  URINALYSIS, ROUTINE W REFLEX MICROSCOPIC    EKG EKG Interpretation  Date/Time:  Wednesday Sep 06 2021 20:35:15 EDT Ventricular Rate:  92 PR Interval:  144 QRS Duration: 80 QT Interval:  360 QTC Calculation: 445 R Axis:   22 Text Interpretation: Normal sinus rhythm Moderate voltage criteria for LVH, may be normal variant ( R in aVL , Sokolow-Lyon ) Borderline ECG When compared with ECG of 01-Feb-2021 01:22, No significant change since last tracing Confirmed by Aletta Edouard [phone]) on 09/06/2021 9:30:06 PM  Radiology No results found.  Procedures Procedures  {Document cardiac monitor, telemetry assessment procedure when appropriate:1}  Medications Ordered in ED Medications - No data to display  ED Course/ Medical Decision Making/ A&P                           Medical Decision Making Risk Prescription drug management.   Patient presents to the ED with complaints of abdominal pain with N/V/D w/ blood in emesis &  stool, this involves an extensive number of treatment options, and is a complaint that carries with it a high risk of complications and morbidity. Nontoxic, vitals with hypertension.   Ddx including but not limited to: ***  Additional history obtained:  Chart & nursing note reviewed.  External records reviewed:  11/02/19: Colonoscopy:  Impression: 1. Post polypectomy bleed. Likely culprit in the cecum treated with clipping due to the presence of stigmata. Also, clipping of large sigmoid colon polypectomy site as described. No active bleeding at the time of the procedure. Hemostasis maintained post therapy 2. Diverticulosis. 11/06/19 Upper endoscopy:  Impression: 1. Mild retching gastropathy 2. Otherwise unremarkable EGD 3. No evidence for clinically significant GI bleeding 4. Posthemorrhagic anemia from recent post  polypectomy bleed.  Recent hospitalization 08/09/21-08/12/21 for acute on chronic pancreatitis  CT imaging 08/09/21:  Impression: 1. Similar appearance of diffuse peripancreatic fat stranding and fluid, consistent with acute pancreatitis. No evidence of acute pancreatic fluid collection or pancreatic necrosis. 2. Hepatic steatosis. 3. Status post cholecystectomy. 4. 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  Lab Tests:  I viewed & interpreted labs including:  CBC: hgb/hct actually mildly improved from prior.  CMP: Hypokalemia, AST increased from prior ***. BUN is normal.  Lipase: WNL UA: hgb present, no obvious UTI.   Plan for symptomatic management- zofran ordered for nausea, dilaudid for pain, and fluids for hydration.   23:50: RE-EVAL: Patient feeling improved temporarily however pain returning. Will re-dose dilaudid and give pepcid.    Based on patient's chief complaint, I considered admission might be necessary, however after reassuring ED workup feel patient is reasonable for discharge.   Critical Interventions: ***  Amount and/or  Complexity of Data Reviewed Independent Historian: *** External Data Reviewed: *** Labs: *** Ordered, viewed, and interpreted- Decision-making details documented in ED Course. Radiology: ***Ordered, viewed, and interpretation performed. Decision-making details documented in ED Course. ECG/medicine tests: ***ordered and independent interpretation performed. Discussion of management or test interpretation with external provider(s): *** Social determinants: ***   Portions of this note were generated with [employer]. Dictation errors may occur despite best attempts at proofreading.   {Document critical care time when appropriate:1} {Document review of labs and clinical decision tools ie heart score, Chads2Vasc2 etc:1}  {Document your independent review of radiology images, and any outside records:1} {Document your discussion with family members, caretakers, and with consultants:1} {Document social determinants of health affecting pt's care:1} {Document your decision making why or why not admission, treatments were needed:1} Final Clinical Impression(s) / ED Diagnoses Final diagnoses:  None    Rx / DC Orders ED Discharge Orders     None

## 2021-09-07 MED ORDER — ONDANSETRON 4 MG PO TBDP: 4.0000 mg | ORAL_TABLET | Freq: Three times a day (TID) | ORAL | 0 refills | Status: DC | PRN

## 2021-09-07 MED ORDER — POTASSIUM CHLORIDE CRYS ER 20 MEQ PO TBCR
40.0000 meq | EXTENDED_RELEASE_TABLET | Freq: Once | ORAL | Status: AC
  Administered 2021-09-07: 40 meq via ORAL
  Filled 2021-09-07: qty 2

## 2021-09-07 MED ORDER — POTASSIUM CHLORIDE CRYS ER 20 MEQ PO TBCR: 20.0000 meq | EXTENDED_RELEASE_TABLET | Freq: Every day | ORAL | 0 refills | Status: DC

## 2021-09-07 NOTE — ED Notes (Signed)
Pt reports not being able to hold down his hypertensive medications for the past few days.

## 2021-09-07 NOTE — Discharge Instructions (Addendum)
You are seen in the emergency department today for abdominal pain with vomiting and diarrhea possible blood in the vomit and diarrhea.  Your work-up was overall reassuring.  Your AST which is a liver function test was mildly elevated, your potassium was mildly low.  We are sending you with a potassium supplement to take.  Please include potassium rich foods in your diet.  Please take Zofran every 8 hours as needed for nausea and vomiting  We have prescribed you new medication(s) today. Discuss the medications prescribed today with your pharmacist as they can have adverse effects and interactions with your other medicines including over the counter and prescribed medications. Seek medical evaluation if you start to experience new or abnormal symptoms after taking one of these medicines, seek care immediately if you start to experience difficulty breathing, feeling of your throat closing, facial swelling, or rash as these could be indications of a more serious allergic reaction  Please follow a clear liquid diet for the next few days.  Follow-up with your primary care or GI soon as possible prior to transitioning diet.  Return to the ER for any new or worsening symptoms including but not limited to new or worsening pain, inability to keep fluids down, dark/tarry stool, coffee-ground appearing vomit, passing out, or any other concerns.

## 2021-09-12 ENCOUNTER — Other Ambulatory Visit: Payer: Self-pay | Admitting: Internal Medicine

## 2021-09-12 ENCOUNTER — Other Ambulatory Visit (HOSPITAL_COMMUNITY): Payer: Self-pay

## 2021-09-14 ENCOUNTER — Other Ambulatory Visit (HOSPITAL_COMMUNITY): Payer: Self-pay

## 2021-09-18 ENCOUNTER — Inpatient Hospital Stay (HOSPITAL_COMMUNITY)
Admission: EM | Admit: 2021-09-18 | Discharge: 2021-09-26 | DRG: 439 | Disposition: A | Discharge status: Home / Self Care | Payer: Managed Care, Other (non HMO) | Attending: Internal Medicine | Admitting: Internal Medicine

## 2021-09-18 ENCOUNTER — Encounter (HOSPITAL_COMMUNITY): Payer: Self-pay

## 2021-09-18 DIAGNOSIS — M545 Low back pain, unspecified: Secondary | ICD-10-CM | POA: Diagnosis present

## 2021-09-18 DIAGNOSIS — R109 Unspecified abdominal pain: Secondary | ICD-10-CM

## 2021-09-18 DIAGNOSIS — Z79899 Other long term (current) drug therapy: Secondary | ICD-10-CM

## 2021-09-18 DIAGNOSIS — E876 Hypokalemia: Secondary | ICD-10-CM | POA: Diagnosis present

## 2021-09-18 DIAGNOSIS — I10 Essential (primary) hypertension: Secondary | ICD-10-CM | POA: Diagnosis present

## 2021-09-18 DIAGNOSIS — Z888 Allergy status to other drugs, medicaments and biological substances status: Secondary | ICD-10-CM

## 2021-09-18 DIAGNOSIS — Z8701 Personal history of pneumonia (recurrent): Secondary | ICD-10-CM

## 2021-09-18 DIAGNOSIS — K859 Acute pancreatitis without necrosis or infection, unspecified: Principal | ICD-10-CM | POA: Diagnosis present

## 2021-09-18 DIAGNOSIS — R7401 Elevation of levels of liver transaminase levels: Secondary | ICD-10-CM | POA: Diagnosis present

## 2021-09-18 DIAGNOSIS — Q453 Other congenital malformations of pancreas and pancreatic duct: Secondary | ICD-10-CM

## 2021-09-18 DIAGNOSIS — Z886 Allergy status to analgesic agent status: Secondary | ICD-10-CM

## 2021-09-18 DIAGNOSIS — K861 Other chronic pancreatitis: Secondary | ICD-10-CM | POA: Diagnosis present

## 2021-09-18 DIAGNOSIS — E7800 Pure hypercholesterolemia, unspecified: Secondary | ICD-10-CM | POA: Diagnosis present

## 2021-09-18 DIAGNOSIS — Q600 Renal agenesis, unilateral: Secondary | ICD-10-CM

## 2021-09-18 DIAGNOSIS — R112 Nausea with vomiting, unspecified: Principal | ICD-10-CM

## 2021-09-18 DIAGNOSIS — K920 Hematemesis: Secondary | ICD-10-CM | POA: Diagnosis not present

## 2021-09-18 DIAGNOSIS — K219 Gastro-esophageal reflux disease without esophagitis: Secondary | ICD-10-CM | POA: Diagnosis present

## 2021-09-18 DIAGNOSIS — D7589 Other specified diseases of blood and blood-forming organs: Secondary | ICD-10-CM | POA: Diagnosis present

## 2021-09-18 DIAGNOSIS — K921 Melena: Secondary | ICD-10-CM | POA: Diagnosis present

## 2021-09-18 DIAGNOSIS — K3184 Gastroparesis: Secondary | ICD-10-CM | POA: Diagnosis present

## 2021-09-18 DIAGNOSIS — Z8249 Family history of ischemic heart disease and other diseases of the circulatory system: Secondary | ICD-10-CM

## 2021-09-18 DIAGNOSIS — G8929 Other chronic pain: Secondary | ICD-10-CM | POA: Diagnosis present

## 2021-09-18 DIAGNOSIS — Z87442 Personal history of urinary calculi: Secondary | ICD-10-CM

## 2021-09-18 DIAGNOSIS — G43909 Migraine, unspecified, not intractable, without status migrainosus: Secondary | ICD-10-CM | POA: Diagnosis present

## 2021-09-18 NOTE — ED Provider Triage Note (Incomplete)
Emergency Medicine Provider Triage Evaluation Note  Kyle Berry , a 51 y.o. male  was evaluated in triage.  Pt complains of ***.  Review of Systems  Positive: *** Negative: ***  Physical Exam  BP (!) 142/109   Pulse (!) 105   Temp 99.6 F (37.6 C) (Oral)   Resp 18   SpO2 98%  Gen:   Awake, no distress  *** Resp:  Normal effort *** MSK:   Moves extremities without difficulty *** Other:  ***  Medical Decision Making  Medically screening exam initiated at 11:57 PM.  Appropriate orders placed.  [name] was informed that the remainder of the evaluation will be completed by another provider, this initial triage assessment does not replace that evaluation, and the importance of remaining in the ED until their evaluation is complete.  ***

## 2021-09-18 NOTE — ED Provider Triage Note (Signed)
Emergency Medicine Provider Triage Evaluation Note  Kyle Berry , a 51 y.o. male  was evaluated in triage.  Pt complains of hematemesis and bloody stool going on multiple weeks.  Seen here multiple times for the same.  Normal labs with exception of hypokalemia 3 weeks ago.  Review of Systems  Positive: Hematemesis, hematochezia Negative: Melena, chest pain, abdominal pain, shortness of breath, palpitation, syncope, dysuria, hematuria, urinary frequency or urgency  Physical Exam  BP (!) 142/109   Pulse (!) 105   Temp 99.6 F (37.6 C) (Oral)   Resp 18   SpO2 98%  Gen:   Awake, no distress   Resp:  Normal effort  MSK:   Moves extremities without difficulty  Other:  Generalized lower abdominal pain  Medical Decision Making  Medically screening exam initiated at 12:18 AM.  Appropriate orders placed.  [name] was informed that the remainder of the evaluation will be completed by another provider, this initial triage assessment does not replace that evaluation, and the importance of remaining in the ED until their evaluation is complete.  Well-appearing. This chart was dictated using voice recognition software, Dragon. Despite the best efforts of this provider to proofread and correct errors, errors may still occur which can change documentation meaning.   Emeline Darling, PA-C 09/19/21 0018

## 2021-09-18 NOTE — ED Triage Notes (Signed)
Pt reports he is here today due to bloody emesis and diarrhea . Pt reports it is ongoing problem. Pt was seen 5/24 for same. Pt reports he can no longer deal with it at home.

## 2021-09-19 ENCOUNTER — Other Ambulatory Visit: Payer: Self-pay

## 2021-09-19 ENCOUNTER — Observation Stay (HOSPITAL_COMMUNITY): Payer: Managed Care, Other (non HMO)

## 2021-09-19 DIAGNOSIS — K921 Melena: Secondary | ICD-10-CM

## 2021-09-19 DIAGNOSIS — K219 Gastro-esophageal reflux disease without esophagitis: Secondary | ICD-10-CM | POA: Diagnosis not present

## 2021-09-19 DIAGNOSIS — Q600 Renal agenesis, unilateral: Secondary | ICD-10-CM | POA: Diagnosis not present

## 2021-09-19 DIAGNOSIS — I10 Essential (primary) hypertension: Secondary | ICD-10-CM

## 2021-09-19 DIAGNOSIS — R112 Nausea with vomiting, unspecified: Secondary | ICD-10-CM | POA: Diagnosis not present

## 2021-09-19 DIAGNOSIS — K861 Other chronic pancreatitis: Secondary | ICD-10-CM

## 2021-09-19 DIAGNOSIS — K920 Hematemesis: Secondary | ICD-10-CM

## 2021-09-19 DIAGNOSIS — Q453 Other congenital malformations of pancreas and pancreatic duct: Secondary | ICD-10-CM

## 2021-09-19 HISTORY — DX: Melena: K92.1

## 2021-09-19 LAB — CBC
HCT: 39.1 % (ref 39.0–52.0)
Hemoglobin: 13.7 g/dL (ref 13.0–17.0)
MCH: 35.2 pg — ABNORMAL HIGH (ref 26.0–34.0)
MCHC: 35 g/dL (ref 30.0–36.0)
MCV: 100.5 fL — ABNORMAL HIGH (ref 80.0–100.0)
Platelets: 214 10*3/uL (ref 150–400)
RBC: 3.89 MIL/uL — ABNORMAL LOW (ref 4.22–5.81)
RDW: 17 % — ABNORMAL HIGH (ref 11.5–15.5)
WBC: 6.6 10*3/uL (ref 4.0–10.5)
nRBC: 0 % (ref 0.0–0.2)

## 2021-09-19 LAB — COMPREHENSIVE METABOLIC PANEL
ALT: 28 U/L (ref 0–44)
AST: 50 U/L — ABNORMAL HIGH (ref 15–41)
Albumin: 4 g/dL (ref 3.5–5.0)
Alkaline Phosphatase: 63 U/L (ref 38–126)
Anion gap: 13 (ref 5–15)
BUN: 8 mg/dL (ref 6–20)
CO2: 19 mmol/L — ABNORMAL LOW (ref 22–32)
Calcium: 9.1 mg/dL (ref 8.9–10.3)
Chloride: 105 mmol/L (ref 98–111)
Creatinine, Ser: 1.08 mg/dL (ref 0.61–1.24)
GFR, Estimated: >60 mL/min (ref >60)
Glucose, Bld: 98 mg/dL (ref 70–99)
Potassium: 3.6 mmol/L (ref 3.5–5.1)
Sodium: 137 mmol/L (ref 135–145)
Total Bilirubin: 1.1 mg/dL (ref 0.3–1.2)
Total Protein: 7.3 g/dL (ref 6.5–8.1)

## 2021-09-19 LAB — TYPE AND SCREEN
ABO/RH(D): O POS
Antibody Screen: NEGATIVE

## 2021-09-19 LAB — URINALYSIS, ROUTINE W REFLEX MICROSCOPIC
Bacteria, UA: NONE SEEN
Bilirubin Urine: NEGATIVE
Glucose, UA: NEGATIVE mg/dL
Hgb urine dipstick: NEGATIVE
Ketones, ur: 5 mg/dL — AB
Leukocytes,Ua: NEGATIVE
Nitrite: NEGATIVE
Protein, ur: 100 mg/dL — AB
Specific Gravity, Urine: 1.028 (ref 1.005–1.030)
pH: 5 (ref 5.0–8.0)

## 2021-09-19 LAB — GAMMA GT: GGT: 304 U/L — ABNORMAL HIGH (ref 7–50)

## 2021-09-19 LAB — LIPASE, BLOOD: Lipase: 45 U/L (ref 11–51)

## 2021-09-19 LAB — HEMOGLOBIN AND HEMATOCRIT, BLOOD
HCT: 37.4 % — ABNORMAL LOW (ref 39.0–52.0)
Hemoglobin: 12.7 g/dL — ABNORMAL LOW (ref 13.0–17.0)

## 2021-09-19 MED ORDER — ENOXAPARIN SODIUM 40 MG/0.4ML IJ SOSY
40.0000 mg | PREFILLED_SYRINGE | INTRAMUSCULAR | Status: DC
  Administered 2021-09-19 – 2021-09-25 (×7): 40 mg via SUBCUTANEOUS
  Filled 2021-09-19 (×7): qty 0.4

## 2021-09-19 MED ORDER — FENTANYL CITRATE PF 50 MCG/ML IJ SOSY
75.0000 ug | PREFILLED_SYRINGE | Freq: Once | INTRAMUSCULAR | Status: AC
  Administered 2021-09-19: 75 ug via INTRAVENOUS
  Filled 2021-09-19: qty 2

## 2021-09-19 MED ORDER — NAPHAZOLINE-PHENIRAMINE 0.025-0.3 % OP SOLN: 1.0000 [drp] | Freq: Four times a day (QID) | OPHTHALMIC | Status: DC | PRN

## 2021-09-19 MED ORDER — ONDANSETRON HCL 4 MG/2ML IJ SOLN
4.0000 mg | Freq: Once | INTRAMUSCULAR | Status: AC
Start: 2021-09-19 — End: 2021-09-19
  Administered 2021-09-19: 4 mg via INTRAVENOUS
  Filled 2021-09-19: qty 2

## 2021-09-19 MED ORDER — HYDROMORPHONE HCL 1 MG/ML IJ SOLN
0.5000 mg | INTRAMUSCULAR | Status: DC | PRN
  Administered 2021-09-19 – 2021-09-22 (×16): 0.5 mg via INTRAVENOUS
  Filled 2021-09-19 (×17): qty 1

## 2021-09-19 MED ORDER — ACETAMINOPHEN 325 MG PO TABS
650.0000 mg | ORAL_TABLET | Freq: Four times a day (QID) | ORAL | Status: DC | PRN
  Administered 2021-09-25 – 2021-09-26 (×2): 650 mg via ORAL
  Filled 2021-09-19 (×2): qty 2

## 2021-09-19 MED ORDER — METOCLOPRAMIDE HCL 5 MG/ML IJ SOLN
10.0000 mg | Freq: Once | INTRAMUSCULAR | Status: AC
  Administered 2021-09-19: 10 mg via INTRAVENOUS
  Filled 2021-09-19: qty 2

## 2021-09-19 MED ORDER — ONDANSETRON HCL 4 MG/2ML IJ SOLN
4.0000 mg | Freq: Four times a day (QID) | INTRAMUSCULAR | Status: DC | PRN
  Administered 2021-09-19 – 2021-09-25 (×9): 4 mg via INTRAVENOUS
  Filled 2021-09-19 (×9): qty 2

## 2021-09-19 MED ORDER — SODIUM CHLORIDE 0.9% FLUSH
3.0000 mL | Freq: Two times a day (BID) | INTRAVENOUS | Status: DC
  Administered 2021-09-19 – 2021-09-24 (×5): 3 mL via INTRAVENOUS

## 2021-09-19 MED ORDER — IOHEXOL 300 MG/ML  SOLN
85.0000 mL | Freq: Once | INTRAMUSCULAR | Status: AC | PRN
  Administered 2021-09-19: 85 mL via INTRAVENOUS

## 2021-09-19 MED ORDER — ONDANSETRON HCL 4 MG PO TABS: 4.0000 mg | ORAL_TABLET | Freq: Four times a day (QID) | ORAL | Status: DC | PRN

## 2021-09-19 MED ORDER — ACETAMINOPHEN 650 MG RE SUPP: 650.0000 mg | Freq: Four times a day (QID) | RECTAL | Status: DC | PRN

## 2021-09-19 MED ORDER — SODIUM CHLORIDE 0.9 % IV BOLUS
1000.0000 mL | Freq: Once | INTRAVENOUS | Status: AC
  Administered 2021-09-19: 1000 mL via INTRAVENOUS

## 2021-09-19 MED ORDER — HYDRALAZINE HCL 20 MG/ML IJ SOLN: 10.0000 mg | INTRAMUSCULAR | Status: DC | PRN

## 2021-09-19 MED ORDER — HYDROMORPHONE HCL 1 MG/ML IJ SOLN
1.0000 mg | Freq: Once | INTRAMUSCULAR | Status: AC
  Administered 2021-09-19: 1 mg via INTRAVENOUS
  Filled 2021-09-19: qty 1

## 2021-09-19 MED ORDER — PROCHLORPERAZINE EDISYLATE 10 MG/2ML IJ SOLN
10.0000 mg | Freq: Once | INTRAMUSCULAR | Status: AC
Start: 2021-09-19 — End: 2021-09-19
  Administered 2021-09-19: 10 mg via INTRAVENOUS
  Filled 2021-09-19: qty 2

## 2021-09-19 MED ORDER — FAMOTIDINE IN NACL 20-0.9 MG/50ML-% IV SOLN
20.0000 mg | Freq: Once | INTRAVENOUS | Status: AC
  Administered 2021-09-19: 20 mg via INTRAVENOUS
  Filled 2021-09-19: qty 50

## 2021-09-19 MED ORDER — FAMOTIDINE IN NACL 20-0.9 MG/50ML-% IV SOLN
20.0000 mg | Freq: Two times a day (BID) | INTRAVENOUS | Status: DC
  Administered 2021-09-19 – 2021-09-22 (×6): 20 mg via INTRAVENOUS
  Filled 2021-09-19 (×7): qty 50

## 2021-09-19 MED ORDER — SODIUM CHLORIDE 0.9 % IV SOLN: INTRAVENOUS | Status: DC

## 2021-09-19 MED ORDER — IRBESARTAN 150 MG PO TABS
150.0000 mg | ORAL_TABLET | Freq: Every day | ORAL | Status: DC
  Administered 2021-09-21 – 2021-09-26 (×6): 150 mg via ORAL
  Filled 2021-09-19 (×7): qty 1

## 2021-09-19 MED ORDER — SODIUM CHLORIDE 0.9 % IV SOLN: Freq: Once | INTRAVENOUS | Status: AC

## 2021-09-19 NOTE — ED Provider Notes (Signed)
[facility] EMERGENCY DEPARTMENT Provider Note   CSN: [id number] Arrival date & time: 09/18/21  2240     History  Chief Complaint  Patient presents with   Hematemesis    Kyle Berry is a 51 y.o. male.  HPI  51 year old male with past medical history of HTN, HLD, chronic pancreatitis with intermittent episodes of nausea/vomiting/hematemesis with most recent admission being April/2023 presents to the emergency department with ongoing nausea/vomiting, bloody emesis and epigastric abdominal pain.  Patient states he never got back to baseline after his recent admission in April.  He has follow-up with an outpatient gastroenterologist who has performed endoscopy before but he cannot get in sooner than August which is 2 months away.  He complains of ongoing symptoms, not controlled with his home medications.  Home Medications Prior to Admission medications   Medication Sig Start Date End Date Taking? Authorizing Provider  acetaminophen (TYLENOL) 325 MG tablet Take 650 mg by mouth every 6 (six) hours as needed.    [provider]  famotidine (PEPCID) 20 MG tablet Take 1 tablet (20 mg total) by mouth 2 (two) times daily. Instructions: Take Pepcid 20 mg twice daily x30 days, then take Pepcid 20 mg daily thereafter. Patient taking differently: Take 20 mg by mouth as needed. 11/03/20 08/19/21  Kayleen Memos, DO  feeding supplement (ENSURE ENLIVE / ENSURE PLUS) LIQD Take 237 mLs by mouth 2 (two) times daily between meals. 02/04/21   Terrilee Croak, MD  lipase/protease/amylase (CREON) 36000 UNITS CPEP capsule Take 2 capsules (72,000 Units total) by mouth 3 (three) times daily with meals. 08/12/21   Sanjuan Dame, MD  Naphazoline-Pheniramine (VISINE-A OP) Place 1 drop into both eyes daily as needed (red eyes).    [provider]  ondansetron (ZOFRAN-ODT) 4 MG disintegrating tablet Take 1 tablet (4 mg total) by mouth every 8 (eight) hours as needed for nausea  or vomiting. 09/07/21   Petrucelli, Samantha R, PA-C  potassium chloride SA (KLOR-CON M) 20 MEQ tablet Take 1 tablet (20 mEq total) by mouth daily for 5 days. 09/07/21 09/12/21  Petrucelli, Glynda Jaeger, PA-C  promethazine (PHENERGAN) 25 MG tablet Take 1 tablet (25 mg total) by mouth every 6 (six) hours as needed for nausea or vomiting. 06/24/21   Geradine Girt, DO  telmisartan (MICARDIS) 40 MG tablet Take 1 tablet (40 mg total) by mouth daily. 04/26/21 05/26/21  Idamae Schuller, MD      Allergies    Diclofenac, Norvasc [amlodipine besylate], Omeprazole, and Prednisone    Review of Systems   Review of Systems  Constitutional:  Negative for fever.  Respiratory:  Negative for shortness of breath.   Cardiovascular:  Negative for chest pain.  Gastrointestinal:  Positive for abdominal pain, diarrhea, nausea and vomiting. Negative for rectal pain.  Skin:  Negative for rash.  Neurological:  Negative for headaches.   Physical Exam Updated Vital Signs BP (!) 154/104   Pulse 79   Temp 98.8 F (37.1 C)   Resp 15   SpO2 99%  Physical Exam Vitals and nursing note reviewed.  Constitutional:      Appearance: Normal appearance.  HENT:     Head: Normocephalic.     Mouth/Throat:     Mouth: Mucous membranes are moist.  Cardiovascular:     Rate and Rhythm: Normal rate.  Pulmonary:     Effort: Pulmonary effort is normal. No respiratory distress.  Abdominal:     General: There is no distension.  Palpations: Abdomen is soft.     Tenderness: There is abdominal tenderness. There is no guarding or rebound.  Skin:    General: Skin is warm.  Neurological:     Mental Status: He is alert and oriented to person, place, and time. Mental status is at baseline.  Psychiatric:        Mood and Affect: Mood normal.    ED Results / Procedures / Treatments   Labs (all labs ordered are listed, but only abnormal results are displayed) Labs Reviewed  COMPREHENSIVE METABOLIC PANEL - Abnormal; Notable for the  following components:      Result Value   CO2 19 (*)    AST 50 (*)    All other components within normal limits  CBC - Abnormal; Notable for the following components:   RBC 3.89 (*)    MCV 100.5 (*)    MCH 35.2 (*)    RDW 17.0 (*)    All other components within normal limits  URINALYSIS, ROUTINE W REFLEX MICROSCOPIC - Abnormal; Notable for the following components:   Ketones, ur 5 (*)    Protein, ur 100 (*)    All other components within normal limits  LIPASE, BLOOD  POC OCCULT BLOOD, ED    EKG None  Radiology No results found.  Procedures Procedures    Medications Ordered in ED Medications  famotidine (PEPCID) IVPB 20 mg premix (20 mg Intravenous New Bag/Given 09/19/21 0937)  ondansetron (ZOFRAN) injection 4 mg (4 mg Intravenous Given 09/19/21 0936)  sodium chloride 0.9 % bolus 1,000 mL (1,000 mLs Intravenous New Bag/Given 09/19/21 0937)  HYDROmorphone (DILAUDID) injection 1 mg (1 mg Intravenous Given 09/19/21 5643)    ED Course/ Medical Decision Making/ A&P                           Medical Decision Making Amount and/or Complexity of Data Reviewed Labs: ordered.  Risk Prescription drug management.   50 year old male presents emergency department with epigastric pain, nausea/vomiting/diarrhea.  Has required admission in the past, has had endoscopies back in 2021, currently is scheduled for outpatient GI follow-up in August.  Coming in today with an exacerbation, not relieved with home meds.  Vital signs are stable.  Abdomen is diffusely tender.  Blood work appears baseline if not improved.  No anemia in regards to the mentioned bloody emesis/black stools.  Patient has been given multiple rounds of IV medications without significant improvement, unable to p.o. water.  We will plan for admission and possible GI consult.  Patients evaluation and results requires admission for further treatment and care.  Spoke with hospitalist, reviewed patient's ED course and they accept  admission.  Patient agrees with admission plan, offers no new complaints and is stable/unchanged at time of admit.        Final Clinical Impression(s) / ED Diagnoses Final diagnoses:  None    Rx / DC Orders ED Discharge Orders     None         Lorelle Gibbs, DO 09/19/21 1441

## 2021-09-19 NOTE — H&P (Signed)
History and Physical    Patient: Kyle Berry WUJ:[id number] DOB: 1970/09/08 DOA: 09/18/2021 DOS: the patient was seen and examined on 09/19/2021 PCP: [name] (Inactive)  Patient coming from: Home  Chief Complaint:  Chief Complaint  Patient presents with   Hematemesis   HPI: Kyle Berry is a 51 y.o. male with medical history significant of chronic pancreatitis, pancreatic divisum, hypertension, and GERD who presents with complaints of 2 weeks of nausea, vomiting, and diarrhea.  He reports that he is intermittently having blood in his emesis as well as his stools.  Blood in his stools as dark red to bright red, but he denies having any dark black stools.  Associated symptoms included complaints of abdominal pain, intermittent shortness of breath, and generalized malaise.  He had been unable to keep down any significant amount of food, liquids, or antiemetics down at home prior to arrival.  He has not drank alcohol in years.  Review of records note patient was admitted in January, March, and April of this year with pancreatitis with CT imaging showing the same.  During the March admission with pancreatitis there have been concern for GI bleed that was thought to be diverticular in nature.  His last colonoscopy was in 2021 by Dr. Henrene Pastor which showed bleeding from polypectomy site, and EGD only noted concern for small hiatal hernia.  He does make note that he is supposed to be on Creon, but is unable to afford the medication due to it costing proximately $3000 per month.  Upon admission into the emergency department patient was noted to be afebrile with pulse 78-1 05, respirations 1126, blood pressure was elevated up to 168/109, and O2 saturations currently maintained on room air.  Labs noted macrocytosis without anemia, lipase 45, AST 50, and ALT 28.  Urinalysis did not show signs of infection.  Patient having given a total of 2 L of normal saline fluids, Zofran, Reglan, Dilaudid  IV, and Pepcid IV.  Due to persistence of symptoms [facility] called to admit.  CT scan of the abdomen and pelvis was obtained, but did not note any acute abnormality.  Review of Systems: As mentioned in the history of present illness. All other systems reviewed and are negative. Past Medical History:  Diagnosis Date   Atrophy of right kidney    Chronic bronchitis (HCC)    Chronic lower back pain    Chronic pancreatitis ([place])    Gastroparesis 06/2020   confirmed by emptying study at [facility]   GERD (gastroesophageal reflux disease)    Headache    "once/month" (05/13/2017)   High cholesterol    Hypertension    Migraine    "a couple/year" (05/13/2017)   Nephrolithiasis 05/13/2017   Pancreatitis    Pneumonia ~ 09/2015   Presence of pancreatic duct stent    Recurrent acute pancreatitis    Shortness of breath 05/22/2021   Past Surgical History:  Procedure Laterality Date   BIOPSY  11/02/2020   Procedure: BIOPSY;  Surgeon: Irving Copas., MD;  Location: [place];  Service: Gastroenterology;;   CHOLECYSTECTOMY N/A 10/23/2017   Procedure: LAPAROSCOPIC CHOLECYSTECTOMY WITH INTRAOPERATIVE CHOLANGIOGRAM;  Surgeon: Johnathan Hausen, MD;  Location: [facility];  Service: General;  Laterality: N/A;   COLONOSCOPY WITH PROPOFOL N/A 11/02/2019   Procedure: COLONOSCOPY WITH PROPOFOL;  Surgeon: Irene Shipper, MD;  Location: [facility] ENDOSCOPY;  Service: Endoscopy;  Laterality: N/A;   ERCP N/A 11/02/2020   Procedure: ENDOSCOPIC RETROGRADE CHOLANGIOPANCREATOGRAPHY (ERCP);  Surgeon: Irving Copas., MD;  Location: [facility] ENDOSCOPY;  Service: Gastroenterology;  Laterality: N/A;   ESOPHAGOGASTRODUODENOSCOPY N/A 11/02/2020   Procedure: ESOPHAGOGASTRODUODENOSCOPY (EGD);  Surgeon: Irving Copas., MD;  Location: [place];  Service: Gastroenterology;  Laterality: N/A;   ESOPHAGOGASTRODUODENOSCOPY (EGD) WITH PROPOFOL N/A 11/06/2019   Procedure: ESOPHAGOGASTRODUODENOSCOPY (EGD) WITH PROPOFOL;  Surgeon: Irene Shipper, MD;  Location: [facility] ENDOSCOPY;  Service: Endoscopy;  Laterality: N/A;   HEMOSTASIS CLIP PLACEMENT  11/02/2019   Procedure: HEMOSTASIS CLIP PLACEMENT;  Surgeon: Irene Shipper, MD;  Location: [facility] ENDOSCOPY;  Service: Endoscopy;;   NO PAST SURGERIES     REMOVAL OF STONES  11/02/2020   Procedure: REMOVAL OF STONES;  Surgeon: Irving Copas., MD;  Location: [place];  Service: Gastroenterology;;   Lavell Islam REMOVAL  11/02/2020   Procedure: STENT REMOVAL;  Surgeon: Irving Copas., MD;  Location: [place];  Service: Gastroenterology;;   Social History:  reports that he has never smoked. He has never used smokeless tobacco. He reports that he does not currently use alcohol. He reports that he does not use drugs.  Allergies  Allergen Reactions   Diclofenac Hives and Other (See Comments)    Fluid buildup in chest   Norvasc [Amlodipine Besylate] Other (See Comments)    Fluid buildup in chest   Omeprazole Other (See Comments)    MD stopped due to pancreatitis   Hydrochlorothiazide     07/31/2016 -  07/20/2016 -  05/28/2016 -   Other Reaction(s) from [place]: itching   Prednisone Hives and Other (See Comments)    Mood swings    Family History  Problem Relation Age of Onset   Hypertension Other    Hypertension Mother    Hypertension Father    Kidney disease Father    Hypertension Sister    Diabetes Sister    Hypertension Brother    Pancreatic cancer Paternal Grandmother    Colon cancer Cousin    Stomach cancer Neg Hx    Esophageal cancer Neg Hx     Prior to Admission medications   Medication Sig Start Date End Date Taking? Authorizing Provider  acetaminophen (TYLENOL) 500 MG tablet Take 500 mg by mouth 2 (two) times daily as needed (pain).   Yes [provider]  feeding supplement (ENSURE ENLIVE / ENSURE PLUS) LIQD Take 237 mLs by mouth 2 (two) times daily between meals. Patient taking differently: Take 237 mLs by mouth See admin instructions. Drink 237  ml by mouth 2-3 times a day between meals 02/04/21  Yes Dahal, Marlowe Aschoff, MD  Naphazoline-Pheniramine (VISINE-A OP) Place 2 drops into both eyes daily.   Yes [provider]  ondansetron (ZOFRAN-ODT) 4 MG disintegrating tablet Take 1 tablet (4 mg total) by mouth every 8 (eight) hours as needed for nausea or vomiting. Patient taking differently: Take 4 mg by mouth 2 (two) times daily as needed for nausea or vomiting. 09/07/21  Yes Petrucelli, Samantha R, PA-C  promethazine (PHENERGAN) 25 MG tablet Take 1 tablet (25 mg total) by mouth every 6 (six) hours as needed for nausea or vomiting. Patient taking differently: Take 25 mg by mouth 2 (two) times daily as needed for nausea or vomiting. 06/24/21  Yes Eliseo Squires, Jessica U, DO  telmisartan (MICARDIS) 40 MG tablet Take 1 tablet (40 mg total) by mouth daily. 04/26/21 09/19/21 Yes Idamae Schuller, MD  famotidine (PEPCID) 20 MG tablet Take 1 tablet (20 mg total) by mouth 2 (two) times daily. Instructions: Take Pepcid 20 mg twice daily x30 days, then take  Pepcid 20 mg daily thereafter. Patient not taking: Reported on 09/19/2021 11/03/20 08/19/21  Kayleen Memos, DO  lipase/protease/amylase (CREON) 36000 UNITS CPEP capsule Take 2 capsules (72,000 Units total) by mouth 3 (three) times daily with meals. Patient not taking: Reported on 09/19/2021 08/12/21   Sanjuan Dame, MD  potassium chloride SA (KLOR-CON M) 20 MEQ tablet Take 1 tablet (20 mEq total) by mouth daily for 5 days. Patient not taking: Reported on 09/19/2021 09/07/21 09/12/21  Amaryllis Dyke, PA-C    Physical Exam: Vitals:   09/19/21 1300 09/19/21 1345 09/19/21 1430 09/19/21 1515  BP: (!) 156/96 (!) 148/107 (!) 162/98 (!) 150/95  Pulse: 80 72 79 66  Resp: 19 20 (!) 26 14  Temp:      TempSrc:      SpO2: 98% 99% 98% 97%    Constitutional: Middle-age male currently in no acute distress Eyes: PERRL, lids and conjunctivae normal ENMT: Mucous membranes are dry.  Posterior pharynx clear of any  exudate or lesions.  Neck: normal, supple, no masses, no thyromegaly Respiratory: clear to auscultation bilaterally, no wheezing, no crackles. Normal respiratory effort. No accessory muscle use.  Cardiovascular: Regular rate and rhythm, no murmurs / rubs / gallops. No extremity edema. 2+ pedal pulses. No carotid bruits.  Abdomen: no tenderness, no masses palpated. No hepatosplenomegaly. Bowel sounds positive.  Musculoskeletal: no clubbing / cyanosis. No joint deformity upper and lower extremities. Good ROM, no contractures. Normal muscle tone.  Skin: no rashes, lesions, ulcers. No induration Neurologic: CN 2-12 grossly intact. Sensation intact, DTR normal. Strength 5/5 in all 4.  Psychiatric: Normal judgment and insight. Alert and oriented x 3. Normal mood.   Data Reviewed:  EKG noted sinus rhythm 83 bpm  Assessment and Plan: Nausea, vomiting, and diarrhea Chronic pancreatitis pancreatic divisum  Patient presented with complaints of abdominal pain with complaints of nausea, vomiting, and diarrhea.  Initial lipase within normal limits and CT scan of the abdomen pelvis without clear signs of pancreatitis or focal area of bleeding. He has been unable to obtain Creon, but his insurance preferred alternative was noted to be. [place] to a medical telemetry bed -Aspiration precautions with elevation of head of bed -Monitor intake and output -Normal saline IV fluid at 100 mL/h -Advance diet as tolerated -TOC consulted for issues obtaining creon  Hematochezia and hematemesis Prior to arrival patient reported having blood in stool and emesis for 2 weeks.  Review of records note patient has had intermittent blood with acute exacerbations of pancreatitis.  Hemoglobin 13.7 on admission with vital signs stable. Hemoglobin previously 13.1 on 5/24.  He was scheduled to have follow-up with GI -Type and screen -Continue to monitor H&H -Formally consult GI if found to have an active  bleed  Essential hypertension Blood pressures  elevated up to 168/109.  Blood pressure regimen includes telmisartan 40 mg daily. -Pharmacy substitution of irbesartan -Hydralazine IV as needed  Elevated AST Chronic.  AST was 50 with ALT 28.  Review of records note AST has been elevated in the past.  Solitary kidney  Jerrye Bushy -Pepcid IV  Advance Care Planning:   Code Status: Full Code   Consults: None  Family Communication: None requested  Severity of Illness: The appropriate patient status for this patient is OBSERVATION. Observation status is judged to be reasonable and necessary in order to provide the required intensity of service to ensure the patient's safety. The patient's presenting symptoms, physical exam findings, and initial radiographic and laboratory data in  the context of their medical condition is felt to place them at decreased risk for further clinical deterioration. Furthermore, it is anticipated that the patient will be medically stable for discharge from the hospital within 2 midnights of admission.   Author: Norval Morton, MD 09/19/2021 3:29 PM  For on call review [email or web address].

## 2021-09-19 NOTE — ED Notes (Signed)
Pt reports ongoing pancreatitis dx x 3 weeks. Reports bloody emesis and black stools with nausea, vomiting and diarrhea. Not on blood thinners. Alert and oriented x 4. Last PO was last night at 2000 and pt threw up after eating. Unable to tolerate PO at home. Will continue to monitor.

## 2021-09-20 DIAGNOSIS — R197 Diarrhea, unspecified: Secondary | ICD-10-CM | POA: Diagnosis not present

## 2021-09-20 DIAGNOSIS — G8929 Other chronic pain: Secondary | ICD-10-CM | POA: Diagnosis present

## 2021-09-20 DIAGNOSIS — R112 Nausea with vomiting, unspecified: Secondary | ICD-10-CM | POA: Diagnosis not present

## 2021-09-20 DIAGNOSIS — K3184 Gastroparesis: Secondary | ICD-10-CM | POA: Diagnosis present

## 2021-09-20 DIAGNOSIS — Z886 Allergy status to analgesic agent status: Secondary | ICD-10-CM | POA: Diagnosis not present

## 2021-09-20 DIAGNOSIS — Z87442 Personal history of urinary calculi: Secondary | ICD-10-CM | POA: Diagnosis not present

## 2021-09-20 DIAGNOSIS — Z888 Allergy status to other drugs, medicaments and biological substances status: Secondary | ICD-10-CM | POA: Diagnosis not present

## 2021-09-20 DIAGNOSIS — E876 Hypokalemia: Secondary | ICD-10-CM | POA: Diagnosis present

## 2021-09-20 DIAGNOSIS — M545 Low back pain, unspecified: Secondary | ICD-10-CM | POA: Diagnosis present

## 2021-09-20 DIAGNOSIS — D7589 Other specified diseases of blood and blood-forming organs: Secondary | ICD-10-CM | POA: Diagnosis present

## 2021-09-20 DIAGNOSIS — E7800 Pure hypercholesterolemia, unspecified: Secondary | ICD-10-CM | POA: Diagnosis present

## 2021-09-20 DIAGNOSIS — Z8701 Personal history of pneumonia (recurrent): Secondary | ICD-10-CM | POA: Diagnosis not present

## 2021-09-20 DIAGNOSIS — K859 Acute pancreatitis without necrosis or infection, unspecified: Secondary | ICD-10-CM | POA: Diagnosis present

## 2021-09-20 DIAGNOSIS — Z8249 Family history of ischemic heart disease and other diseases of the circulatory system: Secondary | ICD-10-CM | POA: Diagnosis not present

## 2021-09-20 DIAGNOSIS — K921 Melena: Secondary | ICD-10-CM | POA: Diagnosis present

## 2021-09-20 DIAGNOSIS — G43909 Migraine, unspecified, not intractable, without status migrainosus: Secondary | ICD-10-CM | POA: Diagnosis present

## 2021-09-20 DIAGNOSIS — Q600 Renal agenesis, unilateral: Secondary | ICD-10-CM | POA: Diagnosis not present

## 2021-09-20 DIAGNOSIS — Z79899 Other long term (current) drug therapy: Secondary | ICD-10-CM | POA: Diagnosis not present

## 2021-09-20 DIAGNOSIS — I10 Essential (primary) hypertension: Secondary | ICD-10-CM | POA: Diagnosis present

## 2021-09-20 DIAGNOSIS — K861 Other chronic pancreatitis: Secondary | ICD-10-CM | POA: Diagnosis present

## 2021-09-20 DIAGNOSIS — K920 Hematemesis: Secondary | ICD-10-CM | POA: Diagnosis present

## 2021-09-20 DIAGNOSIS — R7401 Elevation of levels of liver transaminase levels: Secondary | ICD-10-CM | POA: Diagnosis present

## 2021-09-20 DIAGNOSIS — K219 Gastro-esophageal reflux disease without esophagitis: Secondary | ICD-10-CM | POA: Diagnosis present

## 2021-09-20 DIAGNOSIS — Q453 Other congenital malformations of pancreas and pancreatic duct: Secondary | ICD-10-CM | POA: Diagnosis not present

## 2021-09-20 LAB — CBC
HCT: 37 % — ABNORMAL LOW (ref 39.0–52.0)
Hemoglobin: 13.1 g/dL (ref 13.0–17.0)
MCH: 35 pg — ABNORMAL HIGH (ref 26.0–34.0)
MCHC: 35.4 g/dL (ref 30.0–36.0)
MCV: 98.9 fL (ref 80.0–100.0)
Platelets: 177 10*3/uL (ref 150–400)
RBC: 3.74 MIL/uL — ABNORMAL LOW (ref 4.22–5.81)
RDW: 16.8 % — ABNORMAL HIGH (ref 11.5–15.5)
WBC: 7.6 10*3/uL (ref 4.0–10.5)
nRBC: 0 % (ref 0.0–0.2)

## 2021-09-20 LAB — COMPREHENSIVE METABOLIC PANEL
ALT: 26 U/L (ref 0–44)
AST: 47 U/L — ABNORMAL HIGH (ref 15–41)
Albumin: 3.4 g/dL — ABNORMAL LOW (ref 3.5–5.0)
Alkaline Phosphatase: 59 U/L (ref 38–126)
Anion gap: 7 (ref 5–15)
BUN: <5 mg/dL — ABNORMAL LOW (ref 6–20)
CO2: 24 mmol/L (ref 22–32)
Calcium: 8.4 mg/dL — ABNORMAL LOW (ref 8.9–10.3)
Chloride: 104 mmol/L (ref 98–111)
Creatinine, Ser: 1.13 mg/dL (ref 0.61–1.24)
GFR, Estimated: >60 mL/min (ref >60)
Glucose, Bld: 103 mg/dL — ABNORMAL HIGH (ref 70–99)
Potassium: 3.2 mmol/L — ABNORMAL LOW (ref 3.5–5.1)
Sodium: 135 mmol/L (ref 135–145)
Total Bilirubin: 1.7 mg/dL — ABNORMAL HIGH (ref 0.3–1.2)
Total Protein: 6.4 g/dL — ABNORMAL LOW (ref 6.5–8.1)

## 2021-09-20 MED ORDER — POTASSIUM CHLORIDE CRYS ER 20 MEQ PO TBCR
40.0000 meq | EXTENDED_RELEASE_TABLET | Freq: Two times a day (BID) | ORAL | Status: AC
  Administered 2021-09-20 – 2021-09-21 (×4): 40 meq via ORAL
  Filled 2021-09-20 (×5): qty 2

## 2021-09-20 NOTE — TOC Initial Note (Signed)
Transition of Care [facility]) - Initial/Assessment Note    Patient Details  Name: Kyle Berry MRN: [id number] Date of Birth: Jun 21, 1970  Transition of Care [facility]) CM/SW Contact:    Tom-Johnson, Renea Ee, RN Phone Number: 09/20/2021, 3:32 PM  Clinical Narrative:                  CM spoke with patient at bedside about needs for post hospital transition. Admitted for N/V and Diarrhea. Has hx of Chronic Pancreatitis.  From home with girlfriend. Has three children. Currently employed at [employer] Independent with care prior to admission. Does not drive, uses public transportation or friends transports. No DME's at home.  PCP is Naval architect, Scientist, forensic and uses [facility] on [address]. CM consulted for Medication assistance with [facility] as patient cannot afford medication. Patient states he used to have no co payment and the lady that assisted him left practice and does not know name of organization she used.  Patient has Insurance underwriter and MD states there is no alternate medication. CM got patient's assistance application from [employer] with coupons and MD to fill out his part, patient fill out the rest and then send for acceptance. Form left in patient's chart for MD to fill out. MD aware.  CM will continue to follow with needs.   Expected Discharge Plan: Home/Self Care Barriers to Discharge: Continued Medical Work up   Patient Goals and CMS Choice Patient states their goals for this hospitalization and ongoing recovery are:: To return home CMS Medicare.gov Compare Post Acute Care list provided to:: Patient Choice offered to / list presented to : Patient  Expected Discharge Plan and Services Expected Discharge Plan: Home/Self Care   Discharge Planning Services: CM Consult, Medication Assistance Post Acute Care Choice: NA Living arrangements for the past 2 months: Single Family Home                 DME Arranged: N/A DME Agency: NA       HH Arranged:  NA HH Agency: NA        Prior Living Arrangements/Services Living arrangements for the past 2 months: Single Family Home Lives with:: Significant Other Patient language and need for interpreter reviewed:: Yes Do you feel safe going back to the place where you live?: Yes      Need for Family Participation in Patient Care: Yes (Comment) Care giver support system in place?: Yes (comment)   Criminal Activity/Legal Involvement Pertinent to Current Situation/Hospitalization: No - Comment as needed  Activities of Daily Living Home Assistive Devices/Equipment: None ADL Screening (condition at time of admission) Patient's cognitive ability adequate to safely complete daily activities?: Yes Is the patient deaf or have difficulty hearing?: No Does the patient have difficulty seeing, even when wearing glasses/contacts?: No Does the patient have difficulty concentrating, remembering, or making decisions?: No Patient able to express need for assistance with ADLs?: Yes Does the patient have difficulty dressing or bathing?: No Independently performs ADLs?: Yes (appropriate for developmental age) Does the patient have difficulty walking or climbing stairs?: No Weakness of Legs: None Weakness of Arms/Hands: None  Permission Sought/Granted Permission sought to share information with : Case Manager, Customer service manager, Family Supports Permission granted to share information with : Yes, Verbal Permission Granted              Emotional Assessment Appearance:: Appears stated age Attitude/Demeanor/Rapport: Engaged, Gracious Affect (typically observed): Accepting, Appropriate, Calm, Hopeful Orientation: : Oriented to Self, Oriented to Place, Oriented to  Time, Oriented to Situation Alcohol / Substance Use: Not Applicable Psych Involvement: No (comment)  Admission diagnosis:  N&V (nausea and vomiting) [R11.2] Abdominal pain, unspecified abdominal location [R10.9] Nausea and  vomiting, unspecified vomiting type [R11.2] Pancreatitis, acute [K85.90] Patient Active Problem List   Diagnosis Date Noted   Pancreatitis, acute 09/20/2021   Nausea, vomiting, and diarrhea 09/19/2021   Hematochezia 09/19/2021   Acute pancreatitis 08/10/2021   Colonic ulcer    Pancreatic insufficiency 05/28/2019   Pancreatic divisum    Hematemesis 10/18/2017   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), [place] [address] [place] [address] Phone: [phone] Fax: [phone]  [facility] [address] [place] [address] Phone: [phone] Fax: [phone]     Social Determinants of Health (SDOH) Interventions    Readmission Risk Interventions    11/03/2020    2:19 PM  Readmission Risk Prevention Plan  Transportation Screening Complete  PCP or Specialist Appt within 3-5 Days Complete  HRI or [place] Complete  Social Work Consult for [place] Planning/Counseling Complete  Palliative Care Screening Not Applicable

## 2021-09-20 NOTE — Progress Notes (Signed)
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  Intermittent bloody emesis.  Multiple recent admissions due to abdominal pain suspected secondary to chronic pancreatitis.  Hemodynamically stable in the ER.   Assessment & Plan:   Acute on chronic pancreatitis secondary to pancreatic divisum: Acute on chronic abdominal pain: Persistent abdominal pain and intolerance to diet. N.p.o. except ice chips and medications, adequate IV fluids, adequate IV and oral opiates for pain relief.  Close monitoring of electrolytes.  Lipase is normal.  Hematemesis and hematochezia: No demonstrable blood in vomitus.  Hemoglobin stable. Currently on famotidine IV twice daily, reported possible PPI causing pancreatitis so avoiding. N.p.o., fluids, nausea medications.  If any demonstrable blood in the vomitus, will discuss with GI.  Essential hypertension: Blood pressures elevated.  Hydralazine as needed.  On telmisartan.  GERD: PPI.  Hypokalemia: Replace aggressively.   DVT prophylaxis: enoxaparin (LOVENOX) injection 40 mg Start: 09/19/21 2200   Code Status: Full code Family Communication: None Disposition Plan: Status is: Observation The patient will require care spanning > 2 midnights and should be moved to inpatient because: Unable to tolerate diet.     Consultants:  None  Procedures:  None  Antimicrobials:  None   Subjective: Patient seen and examined.  He tells me that he had a large emesis in the morning.  Vomitus in the bag without any obvious blood.  Not able to tolerate any liquid diet. Complains of epigastric pain, dull and severe needing Dilaudid.  He had normal bowel movement yesterday.  Afebrile.  Objective: Vitals:    09/19/21 2147 09/20/21 0103 09/20/21 0433 09/20/21 0931  BP: (!) 165/100 (!) 161/100 (!) 163/95 (!) 151/96  Pulse: 66 70 76 63  Resp: '18 18 19 18  '$ Temp: 98 F (36.7 C) 98.4 F (36.9 C) 97.7 F (36.5 C) 98.2 F (36.8 C)  TempSrc: Oral Oral Oral   SpO2: 100% 99% 93% 96%  Weight: 72.4 kg     Height: '6\' 1"'$  (1.854 m)       Intake/Output Summary (Last 24 hours) at 09/20/2021 1250 Last data filed at 09/20/2021 0800 Gross per 24 hour  Intake 1584.68 ml  Output --  Net 1584.68 ml   Filed Weights   09/19/21 2147  Weight: 72.4 kg    Examination:  General exam: Appears calm and comfortable at rest.  Uncomfortable on vomiting. Respiratory system: Clear to auscultation. Respiratory effort normal. Cardiovascular system: S1 & S2 heard, RRR.  Gastrointestinal system: Soft.  Moderate tenderness on deep palpation along the epigastrium.  Voluntary guarding.  No rigidity. Central nervous system: Alert and oriented. No focal neurological deficits. Extremities: Symmetric 5 x 5 power. Skin: No rashes, lesions or ulcers Psychiatry: Judgement and insight appear normal. Mood & affect appropriate.     Data Reviewed: I have personally reviewed following labs and imaging studies  CBC: Recent Labs  Lab 09/18/21 2320 09/19/21 1956 09/20/21 0456  WBC 6.6  --  7.6  HGB 13.7 12.7* 13.1  HCT 39.1 37.4* 37.0*  MCV 100.5*  --  98.9  PLT 214  --  097   Basic Metabolic Panel: Recent Labs  Lab 09/18/21 2320 09/20/21 0456  NA 137 135  K 3.6 3.2*  CL 105 104  CO2 19* 24  GLUCOSE 98 103*  BUN 8 <5*  CREATININE 1.08 1.13  CALCIUM 9.1 8.4*   GFR: Estimated Creatinine Clearance: 79.2 mL/min (by C-G formula based on SCr of 1.13 mg/dL). Liver Function Tests: Recent Labs  Lab 09/18/21 2320 09/20/21 0456  AST 50* 47*  ALT 28 26  ALKPHOS 63 59  BILITOT 1.1 1.7*  PROT 7.3 6.4*  ALBUMIN 4.0 3.4*   Recent Labs  Lab 09/18/21 2320  LIPASE 45   No results for input(s): AMMONIA in the last  168 hours. Coagulation Profile: No results for input(s): INR, PROTIME in the last 168 hours. Cardiac Enzymes: No results for input(s): CKTOTAL, CKMB, CKMBINDEX, TROPONINI in the last 168 hours. BNP (last 3 results) No results for input(s): PROBNP in the last 8760 hours. HbA1C: No results for input(s): HGBA1C in the last 72 hours. CBG: No results for input(s): GLUCAP in the last 168 hours. Lipid Profile: No results for input(s): CHOL, HDL, LDLCALC, TRIG, CHOLHDL, LDLDIRECT in the last 72 hours. Thyroid Function Tests: No results for input(s): TSH, T4TOTAL, FREET4, T3FREE, THYROIDAB in the last 72 hours. Anemia Panel: No results for input(s): VITAMINB12, FOLATE, FERRITIN, TIBC, IRON, RETICCTPCT in the last 72 hours. Sepsis Labs: No results for input(s): PROCALCITON, LATICACIDVEN in the last 168 hours.  No results found for this or any previous visit (from the past 240 hour(s)).       Radiology Studies: CT Abdomen Pelvis W Contrast  Result Date: 09/19/2021 CLINICAL DATA:  Abdominal pain EXAM: CT ABDOMEN AND PELVIS WITH CONTRAST TECHNIQUE: Multidetector CT imaging of the abdomen and pelvis was performed using the standard protocol following bolus administration of intravenous contrast. RADIATION DOSE REDUCTION: This exam was performed according to the departmental dose-optimization program which includes automated exposure control, adjustment of the mA and/or kV according to patient size and/or use of iterative reconstruction technique. CONTRAST:  52m OMNIPAQUE IOHEXOL 300 MG/ML  SOLN COMPARISON:  None Available. FINDINGS: Lower chest: Lung bases are clear. Hepatobiliary: No focal hepatic lesion. Postcholecystectomy. No biliary dilatation. Pancreas: Pancreas is normal. No ductal dilatation. No pancreatic inflammation. Spleen: Normal spleen Adrenals/urinary tract: Adrenal glands normal. RIGHT kidney is severely atrophic. LEFT kidney normal. LEFT ureter and bladder normal. Stomach/Bowel:  Stomach, small bowel, appendix, and cecum are normal. Multiple diverticula of the descending colon and sigmoid colon without acute inflammation. Vascular/Lymphatic: Abdominal aorta is normal caliber. No periportal or retroperitoneal adenopathy. No pelvic adenopathy. Reproductive: Prostate unremarkable Other: No inguinal or abdominal hernia. Musculoskeletal: No aggressive osseous lesion. IMPRESSION: 1. No acute findings in the abdomen pelvis. 2. Severely atrophic RIGHT kidney. 3. Post cholecystectomy. 4. Normal appendix. Electronically Signed   By: SSuzy BouchardM.D.   On: 09/19/2021 16:15        Scheduled Meds:  enoxaparin (LOVENOX) injection  40 mg Subcutaneous Q24H   irbesartan  150 mg Oral Daily   potassium chloride  40 mEq Oral BID   sodium chloride flush  3 mL Intravenous Q12H   Continuous Infusions:  sodium chloride 100 mL/hr at 09/20/21 0835   famotidine (PEPCID) IV 20 mg (09/20/21 0838)     LOS: 0 days    Time spent: 35 minutes    KBarb Merino MD [facility] Pager [phone]

## 2021-09-21 DIAGNOSIS — R197 Diarrhea, unspecified: Secondary | ICD-10-CM | POA: Diagnosis not present

## 2021-09-21 DIAGNOSIS — R112 Nausea with vomiting, unspecified: Secondary | ICD-10-CM

## 2021-09-21 LAB — CBC WITH DIFFERENTIAL/PLATELET
Abs Immature Granulocytes: 0.01 10*3/uL (ref 0.00–0.07)
Basophils Absolute: 0 10*3/uL (ref 0.0–0.1)
Basophils Relative: 0 %
Eosinophils Absolute: 0.1 10*3/uL (ref 0.0–0.5)
Eosinophils Relative: 1 %
HCT: 36 % — ABNORMAL LOW (ref 39.0–52.0)
Hemoglobin: 12.1 g/dL — ABNORMAL LOW (ref 13.0–17.0)
Immature Granulocytes: 0 %
Lymphocytes Relative: 31 %
Lymphs Abs: 2 10*3/uL (ref 0.7–4.0)
MCH: 34.3 pg — ABNORMAL HIGH (ref 26.0–34.0)
MCHC: 33.6 g/dL (ref 30.0–36.0)
MCV: 102 fL — ABNORMAL HIGH (ref 80.0–100.0)
Monocytes Absolute: 0.3 10*3/uL (ref 0.1–1.0)
Monocytes Relative: 5 %
Neutro Abs: 3.9 10*3/uL (ref 1.7–7.7)
Neutrophils Relative %: 63 %
Platelets: 179 10*3/uL (ref 150–400)
RBC: 3.53 MIL/uL — ABNORMAL LOW (ref 4.22–5.81)
RDW: 16.5 % — ABNORMAL HIGH (ref 11.5–15.5)
WBC: 6.3 10*3/uL (ref 4.0–10.5)
nRBC: 0 % (ref 0.0–0.2)

## 2021-09-21 LAB — COMPREHENSIVE METABOLIC PANEL
ALT: 20 U/L (ref 0–44)
AST: 28 U/L (ref 15–41)
Albumin: 3.3 g/dL — ABNORMAL LOW (ref 3.5–5.0)
Alkaline Phosphatase: 52 U/L (ref 38–126)
Anion gap: 5 (ref 5–15)
BUN: <5 mg/dL — ABNORMAL LOW (ref 6–20)
CO2: 25 mmol/L (ref 22–32)
Calcium: 8.5 mg/dL — ABNORMAL LOW (ref 8.9–10.3)
Chloride: 104 mmol/L (ref 98–111)
Creatinine, Ser: 0.97 mg/dL (ref 0.61–1.24)
GFR, Estimated: >60 mL/min (ref >60)
Glucose, Bld: 96 mg/dL (ref 70–99)
Potassium: 3.8 mmol/L (ref 3.5–5.1)
Sodium: 134 mmol/L — ABNORMAL LOW (ref 135–145)
Total Bilirubin: 1.4 mg/dL — ABNORMAL HIGH (ref 0.3–1.2)
Total Protein: 6.2 g/dL — ABNORMAL LOW (ref 6.5–8.1)

## 2021-09-21 LAB — PHOSPHORUS: Phosphorus: 2.6 mg/dL (ref 2.5–4.6)

## 2021-09-21 LAB — MAGNESIUM: Magnesium: 1.6 mg/dL — ABNORMAL LOW (ref 1.7–2.4)

## 2021-09-21 LAB — LIPASE, BLOOD: Lipase: 58 U/L — ABNORMAL HIGH (ref 11–51)

## 2021-09-21 MED ORDER — PANCRELIPASE (LIP-PROT-AMYL) 36000-114000 UNITS PO CPEP
36000.0000 [IU] | ORAL_CAPSULE | Freq: Three times a day (TID) | ORAL | Status: DC
Start: 2021-09-21 — End: 2021-09-26
  Administered 2021-09-21 – 2021-09-26 (×15): 36000 [IU] via ORAL
  Filled 2021-09-21 (×15): qty 1

## 2021-09-21 MED ORDER — MAGNESIUM SULFATE 2 GM/50ML IV SOLN
2.0000 g | Freq: Once | INTRAVENOUS | Status: AC
  Administered 2021-09-21: 2 g via INTRAVENOUS
  Filled 2021-09-21: qty 50

## 2021-09-21 NOTE — Progress Notes (Signed)
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  Intermittent bloody emesis.  Multiple recent admissions due to abdominal pain suspected secondary to chronic pancreatitis.  Hemodynamically stable in the ER.   Assessment & Plan:   Acute on chronic pancreatitis secondary to pancreatic divisum: Acute on chronic abdominal pain: Persistent abdominal pain and recurrent problem.  Nausea has improved today. Challenge with clears. adequate IV fluids, adequate IV and oral opiates for pain relief.  Close monitoring of electrolytes.  Lipase is normal.  Hematemesis and hematochezia: No demonstrable blood in vomitus.  Hemoglobin stable. Currently on famotidine IV twice daily, reported possible PPI causing pancreatitis so avoiding. Currently no indication for endoscopy as he has not demonstrated any bleeding in the hospital.  Essential hypertension: Blood pressures elevated.  Hydralazine as needed.  On telmisartan.  GERD: PPI.  Hypokalemia: Replaced with improvement.  Hypomagnesemia: Replace IV.   DVT prophylaxis: enoxaparin (LOVENOX) injection 40 mg Start: 09/19/21 2200   Code Status: Full code Family Communication: None Disposition Plan: Status is: Inpatient.  Remains inpatient because of persistent pain, intolerance to diet.   Consultants:  None  Procedures:  None  Antimicrobials:  None   Subjective:  Patient seen and examined.  Now having hunger pain.  Denies any vomiting.  Wants to try some food.  Still needing to use IV Dilaudid for pain relief.  Passing flatus.  No BM.   Objective: Vitals:   09/20/21 1723 09/20/21 2035 09/21/21 0505 09/21/21 0929  BP: (!) 159/93 (!) 180/106 (!) 160/99 (!) 140/103  Pulse: 74 66 62  73  Resp: '18 18 18 18  '$ Temp: 98.2 F (36.8 C) 98 F (36.7 C) 97.8 F (36.6 C) 98.3 F (36.8 C)  TempSrc:   Oral Oral  SpO2: 99% 99% 100% 97%  Weight:      Height:        Intake/Output Summary (Last 24 hours) at 09/21/2021 1135 Last data filed at 09/21/2021 0537 Gross per 24 hour  Intake 1523.19 ml  Output 0 ml  Net 1523.19 ml    Filed Weights   09/19/21 2147  Weight: 72.4 kg    Examination:  General exam: Appears calm and comfortable at rest.   Respiratory system: Clear to auscultation. Respiratory effort normal. Cardiovascular system: S1 & S2 heard, RRR.  Gastrointestinal system: Soft.  Moderate tenderness on deep palpation along the epigastrium. No rigidity. Central nervous system: Alert and oriented. No focal neurological deficits. Extremities: Symmetric 5 x 5 power. Skin: No rashes, lesions or ulcers Psychiatry: Judgement and insight appear normal. Mood & affect appropriate.     Data Reviewed: I have personally reviewed following labs and imaging studies  CBC: Recent Labs  Lab 09/18/21 2320 09/19/21 1956 09/20/21 0456 09/21/21 0337  WBC 6.6  --  7.6 6.3  NEUTROABS  --   --   --  3.9  HGB 13.7 12.7* 13.1 12.1*  HCT 39.1 37.4* 37.0* 36.0*  MCV 100.5*  --  98.9 102.0*  PLT 214  --  177 607    Basic Metabolic Panel: Recent Labs  Lab 09/18/21 2320 09/20/21 0456 09/21/21 0337  NA 137 135 134*  K 3.6 3.2* 3.8  CL 105 104 104  CO2 19* 24 25  GLUCOSE 98 103* 96  BUN 8 <  5* <5*  CREATININE 1.08 1.13 0.97  CALCIUM 9.1 8.4* 8.5*  MG  --   --  1.6*  PHOS  --   --  2.6    GFR: Estimated Creatinine Clearance: 92.3 mL/min (by C-G formula based on SCr of 0.97 mg/dL). Liver Function Tests: Recent Labs  Lab 09/18/21 2320 09/20/21 0456 09/21/21 0337  AST 50* 47* 28  ALT '28 26 20  '$ ALKPHOS 63 59 52  BILITOT 1.1 1.7* 1.4*  PROT 7.3 6.4* 6.2*  ALBUMIN 4.0 3.4* 3.3*    Recent Labs  Lab 09/18/21 2320 09/21/21 0337  LIPASE 45 58*    No results for  input(s): "AMMONIA" in the last 168 hours. Coagulation Profile: No results for input(s): "INR", "PROTIME" in the last 168 hours. Cardiac Enzymes: No results for input(s): "CKTOTAL", "CKMB", "CKMBINDEX", "TROPONINI" in the last 168 hours. BNP (last 3 results) No results for input(s): "PROBNP" in the last 8760 hours. HbA1C: No results for input(s): "HGBA1C" in the last 72 hours. CBG: No results for input(s): "GLUCAP" in the last 168 hours. Lipid Profile: No results for input(s): "CHOL", "HDL", "LDLCALC", "TRIG", "CHOLHDL", "LDLDIRECT" in the last 72 hours. Thyroid Function Tests: No results for input(s): "TSH", "T4TOTAL", "FREET4", "T3FREE", "THYROIDAB" in the last 72 hours. Anemia Panel: No results for input(s): "VITAMINB12", "FOLATE", "FERRITIN", "TIBC", "IRON", "RETICCTPCT" in the last 72 hours. Sepsis Labs: No results for input(s): "PROCALCITON", "LATICACIDVEN" in the last 168 hours.  No results found for this or any previous visit (from the past 240 hour(s)).       Radiology Studies: CT Abdomen Pelvis W Contrast  Result Date: 09/19/2021 CLINICAL DATA:  Abdominal pain EXAM: CT ABDOMEN AND PELVIS WITH CONTRAST TECHNIQUE: Multidetector CT imaging of the abdomen and pelvis was performed using the standard protocol following bolus administration of intravenous contrast. RADIATION DOSE REDUCTION: This exam was performed according to the departmental dose-optimization program which includes automated exposure control, adjustment of the mA and/or kV according to patient size and/or use of iterative reconstruction technique. CONTRAST:  67m OMNIPAQUE IOHEXOL 300 MG/ML  SOLN COMPARISON:  None Available. FINDINGS: Lower chest: Lung bases are clear. Hepatobiliary: No focal hepatic lesion. Postcholecystectomy. No biliary dilatation. Pancreas: Pancreas is normal. No ductal dilatation. No pancreatic inflammation. Spleen: Normal spleen Adrenals/urinary tract: Adrenal glands normal. RIGHT kidney is  severely atrophic. LEFT kidney normal. LEFT ureter and bladder normal. Stomach/Bowel: Stomach, small bowel, appendix, and cecum are normal. Multiple diverticula of the descending colon and sigmoid colon without acute inflammation. Vascular/Lymphatic: Abdominal aorta is normal caliber. No periportal or retroperitoneal adenopathy. No pelvic adenopathy. Reproductive: Prostate unremarkable Other: No inguinal or abdominal hernia. Musculoskeletal: No aggressive osseous lesion. IMPRESSION: 1. No acute findings in the abdomen pelvis. 2. Severely atrophic RIGHT kidney. 3. Post cholecystectomy. 4. Normal appendix. Electronically Signed   By: SSuzy BouchardM.D.   On: 09/19/2021 16:15        Scheduled Meds:  enoxaparin (LOVENOX) injection  40 mg Subcutaneous Q24H   irbesartan  150 mg Oral Daily   potassium chloride  40 mEq Oral BID   sodium chloride flush  3 mL Intravenous Q12H   Continuous Infusions:  sodium chloride 100 mL/hr at 09/21/21 0537   famotidine (PEPCID) IV 20 mg (09/21/21 0945)     LOS: 1 day    Time spent: 35 minutes    KBarb Merino MD [facility] Pager [phone]

## 2021-09-22 DIAGNOSIS — R112 Nausea with vomiting, unspecified: Secondary | ICD-10-CM | POA: Diagnosis not present

## 2021-09-22 DIAGNOSIS — R197 Diarrhea, unspecified: Secondary | ICD-10-CM | POA: Diagnosis not present

## 2021-09-22 MED ORDER — FAMOTIDINE 20 MG PO TABS
20.0000 mg | ORAL_TABLET | Freq: Two times a day (BID) | ORAL | Status: DC
  Administered 2021-09-22 – 2021-09-26 (×9): 20 mg via ORAL
  Filled 2021-09-22 (×9): qty 1

## 2021-09-22 MED ORDER — OXYCODONE HCL 5 MG PO TABS
5.0000 mg | ORAL_TABLET | ORAL | Status: DC | PRN
  Administered 2021-09-22 – 2021-09-26 (×5): 5 mg via ORAL
  Filled 2021-09-22 (×8): qty 1

## 2021-09-22 NOTE — Progress Notes (Signed)
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  Intermittent bloody emesis.  Multiple recent admissions due to abdominal pain suspected secondary to chronic pancreatitis.  Hemodynamically stable in the ER.   Assessment & Plan:   Acute on chronic pancreatitis secondary to pancreatic divisum: Acute on chronic abdominal pain: Persistent abdominal pain and recurrent problem.  Some clinical improvement today. Tolerated clear liquid diet, will gradually challenge with soft food. Changed to oral pain medications and oral nausea medication to see if this can be managed as outpatient.  Continue IV fluids today.  Hematemesis and hematochezia: No demonstrable blood in vomitus.  Hemoglobin stable. Currently on famotidine IV twice daily, reported possible PPI causing pancreatitis so avoiding. Currently no indication for endoscopy as he has not demonstrated any bleeding in the hospital.  Essential hypertension: Blood pressures stable today. Hydralazine as needed.  On telmisartan.  GERD: PPI.  Hypokalemia: Replaced with improvement.  Hypomagnesemia: Replaced with improvement.  Challenge with regular diet and pain management with oral pain medications.   DVT prophylaxis: enoxaparin (LOVENOX) injection 40 mg Start: 09/19/21 2200   Code Status: Full code Family Communication: None Disposition Plan: Status is: Inpatient.  Remains inpatient because of persistent pain, intolerance to diet.   Consultants:  None  Procedures:  None  Antimicrobials:  None   Subjective:  Patient seen and examined.  Occasionally nauseated.  He thinks he has more of hunger pain.  Epigastric pain persist but better than before.  He thinks he can manage  that with oral pain medications.  Wants to try eating regular food.   Objective: Vitals:   09/21/21 1715 09/21/21 2056 09/22/21 0536 09/22/21 0923  BP: (!) 149/92 (!) 148/101 (!) 160/106 (!) 141/98  Pulse: 65 78 63 62  Resp: '17 18 19 16  '$ Temp: 98.4 F (36.9 C) 98.8 F (37.1 C)  98 F (36.7 C)  TempSrc:  Oral  Oral  SpO2: 100% 99% 98% 100%  Weight:      Height:        Intake/Output Summary (Last 24 hours) at 09/22/2021 1417 Last data filed at 09/22/2021 1300 Gross per 24 hour  Intake 1460 ml  Output 0 ml  Net 1460 ml   Filed Weights   09/19/21 2147  Weight: 72.4 kg    Examination:  General exam: Looks comfortable at rest. Respiratory system: Clear to auscultation. Respiratory effort normal. Cardiovascular system: S1 & S2 heard, RRR.  Gastrointestinal system: Soft.  Moderate tenderness on deep palpation along the epigastrium. No rigidity. Central nervous system: Alert and oriented. No focal neurological deficits. Extremities: Symmetric 5 x 5 power. Skin: No rashes, lesions or ulcers Psychiatry: Judgement and insight appear normal. Mood & affect appropriate.     Data Reviewed: I have personally reviewed following labs and imaging studies  CBC: Recent Labs  Lab 09/18/21 2320 09/19/21 1956 09/20/21 0456 09/21/21 0337  WBC 6.6  --  7.6 6.3  NEUTROABS  --   --   --  3.9  HGB 13.7 12.7* 13.1 12.1*  HCT 39.1 37.4* 37.0* 36.0*  MCV 100.5*  --  98.9 102.0*  PLT 214  --  177 008   Basic Metabolic Panel: Recent Labs  Lab 09/18/21 2320 09/20/21 0456 09/21/21 0337  NA 137 135 134*  K  3.6 3.2* 3.8  CL 105 104 104  CO2 19* 24 25  GLUCOSE 98 103* 96  BUN 8 <5* <5*  CREATININE 1.08 1.13 0.97  CALCIUM 9.1 8.4* 8.5*  MG  --   --  1.6*  PHOS  --   --  2.6   GFR: Estimated Creatinine Clearance: 92.3 mL/min (by C-G formula based on SCr of 0.97 mg/dL). Liver Function Tests: Recent Labs  Lab 09/18/21 2320 09/20/21 0456 09/21/21 0337  AST 50* 47* 28  ALT '28 26  20  '$ ALKPHOS 63 59 52  BILITOT 1.1 1.7* 1.4*  PROT 7.3 6.4* 6.2*  ALBUMIN 4.0 3.4* 3.3*   Recent Labs  Lab 09/18/21 2320 09/21/21 0337  LIPASE 45 58*   No results for input(s): "AMMONIA" in the last 168 hours. Coagulation Profile: No results for input(s): "INR", "PROTIME" in the last 168 hours. Cardiac Enzymes: No results for input(s): "CKTOTAL", "CKMB", "CKMBINDEX", "TROPONINI" in the last 168 hours. BNP (last 3 results) No results for input(s): "PROBNP" in the last 8760 hours. HbA1C: No results for input(s): "HGBA1C" in the last 72 hours. CBG: No results for input(s): "GLUCAP" in the last 168 hours. Lipid Profile: No results for input(s): "CHOL", "HDL", "LDLCALC", "TRIG", "CHOLHDL", "LDLDIRECT" in the last 72 hours. Thyroid Function Tests: No results for input(s): "TSH", "T4TOTAL", "FREET4", "T3FREE", "THYROIDAB" in the last 72 hours. Anemia Panel: No results for input(s): "VITAMINB12", "FOLATE", "FERRITIN", "TIBC", "IRON", "RETICCTPCT" in the last 72 hours. Sepsis Labs: No results for input(s): "PROCALCITON", "LATICACIDVEN" in the last 168 hours.  No results found for this or any previous visit (from the past 240 hour(s)).       Radiology Studies: No results found.      Scheduled Meds:  enoxaparin (LOVENOX) injection  40 mg Subcutaneous Q24H   famotidine  20 mg Oral BID   irbesartan  150 mg Oral Daily   lipase/protease/amylase  36,000 Units Oral TID WC   sodium chloride flush  3 mL Intravenous Q12H   Continuous Infusions:  sodium chloride 100 mL/hr at 09/21/21 0537     LOS: 2 days    Time spent: 35 minutes    Barb Merino, MD [facility] Pager [phone]

## 2021-09-23 DIAGNOSIS — R112 Nausea with vomiting, unspecified: Secondary | ICD-10-CM | POA: Diagnosis not present

## 2021-09-23 DIAGNOSIS — R197 Diarrhea, unspecified: Secondary | ICD-10-CM | POA: Diagnosis not present

## 2021-09-23 MED ORDER — HYDROMORPHONE HCL 1 MG/ML IJ SOLN
0.5000 mg | INTRAMUSCULAR | Status: DC | PRN
  Administered 2021-09-23 – 2021-09-26 (×18): 0.5 mg via INTRAVENOUS
  Filled 2021-09-23 (×18): qty 1

## 2021-09-23 NOTE — Progress Notes (Signed)
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  Intermittent bloody emesis.  Multiple recent admissions due to abdominal pain suspected secondary to chronic pancreatitis.  Hemodynamically stable in the ER. Treated with symptomatic treatment, remains in the hospital due to intolerance to diet and pain is not controlled.   Assessment & Plan:   Acute on chronic pancreatitis secondary to pancreatic divisum: Suspect opioid dependence.  Acute on chronic abdominal pain: Persistent abdominal pain and recurrent problem.  With some clinical improvement, diet was advanced but he started vomiting overnight. Did not tolerate advancement of diet, still back to liquid diet.  Start IV opiates for pain relief as he is not tolerating oral opiates.  Continue IV fluids.  Hematemesis and hematochezia: No demonstrable blood in vomitus.  Hemoglobin stable. Currently no indication for endoscopy as he has not demonstrated any bleeding in the hospital.  Essential hypertension: Blood pressures stable today. Hydralazine as needed.  On telmisartan.  GERD: PPI.  Hypokalemia: Replaced with improvement.  Hypomagnesemia: Replaced with improvement.  Remains in the hospital, inadequate pain control and intolerance to diet.   DVT prophylaxis: enoxaparin (LOVENOX) injection 40 mg Start: 09/19/21 2200   Code Status: Full code Family Communication: None Disposition Plan: Status is: Inpatient.  Remains inpatient because of persistent pain, intolerance to diet.   Consultants:  None  Procedures:  None  Antimicrobials:  None   Subjective:  Patient seen and examined.  After eating some dinner last night, he started vomiting all night.  Complains of  severe abdominal pain.  Objective: Vitals:   09/22/21 1745 09/22/21 2108 09/23/21 0441 09/23/21 0934  BP: 134/87 (!) 145/95 (!) 147/91 130/85  Pulse: 73 67 (!) 57 72  Resp: '16 18 18 18  '$ Temp: 99.3 F (37.4 C) 98.5 F (36.9 C) 98.1 F (36.7 C) 99 F (37.2 C)  TempSrc: Oral Oral Oral Oral  SpO2: 99% 98% 98% 98%  Weight:      Height:        Intake/Output Summary (Last 24 hours) at 09/23/2021 1323 Last data filed at 09/23/2021 0900 Gross per 24 hour  Intake 4718.74 ml  Output 0 ml  Net 4718.74 ml   Filed Weights   09/19/21 2147  Weight: 72.4 kg    Examination:  General exam: Looks comfortable but anxious. Respiratory system: Clear to auscultation. Respiratory effort normal. Cardiovascular system: S1 & S2 heard, RRR.  Gastrointestinal system: Soft.  Moderate tenderness on deep palpation along the epigastrium. No rigidity. Central nervous system: Alert and oriented. No focal neurological deficits. Extremities: Symmetric 5 x 5 power.   Data Reviewed: I have personally reviewed following labs and imaging studies  CBC: Recent Labs  Lab 09/18/21 2320 09/19/21 1956 09/20/21 0456 09/21/21 0337  WBC 6.6  --  7.6 6.3  NEUTROABS  --   --   --  3.9  HGB 13.7 12.7* 13.1 12.1*  HCT 39.1 37.4* 37.0* 36.0*  MCV 100.5*  --  98.9 102.0*  PLT 214  --  177 702   Basic Metabolic Panel: Recent Labs  Lab 09/18/21 2320 09/20/21 0456 09/21/21 0337  NA 137 135 134*  K 3.6 3.2* 3.8  CL 105 104 104  CO2 19* 24 25  GLUCOSE 98 103* 96  BUN 8 <5* <5*  CREATININE 1.08 1.13 0.97  CALCIUM 9.1 8.4* 8.5*  MG  --   --  1.6*  PHOS  --   --  2.6   GFR: Estimated Creatinine Clearance: 92.3 mL/min (by C-G formula based on SCr of 0.97 mg/dL). Liver Function Tests: Recent Labs  Lab 09/18/21 2320 09/20/21 0456 09/21/21 0337  AST 50* 47* 28  ALT '28 26 20  '$ ALKPHOS 63 59 52  BILITOT 1.1 1.7* 1.4*  PROT 7.3 6.4* 6.2*  ALBUMIN 4.0 3.4* 3.3*   Recent Labs  Lab 09/18/21 2320  09/21/21 0337  LIPASE 45 58*   No results for input(s): "AMMONIA" in the last 168 hours. Coagulation Profile: No results for input(s): "INR", "PROTIME" in the last 168 hours. Cardiac Enzymes: No results for input(s): "CKTOTAL", "CKMB", "CKMBINDEX", "TROPONINI" in the last 168 hours. BNP (last 3 results) No results for input(s): "PROBNP" in the last 8760 hours. HbA1C: No results for input(s): "HGBA1C" in the last 72 hours. CBG: No results for input(s): "GLUCAP" in the last 168 hours. Lipid Profile: No results for input(s): "CHOL", "HDL", "LDLCALC", "TRIG", "CHOLHDL", "LDLDIRECT" in the last 72 hours. Thyroid Function Tests: No results for input(s): "TSH", "T4TOTAL", "FREET4", "T3FREE", "THYROIDAB" in the last 72 hours. Anemia Panel: No results for input(s): "VITAMINB12", "FOLATE", "FERRITIN", "TIBC", "IRON", "RETICCTPCT" in the last 72 hours. Sepsis Labs: No results for input(s): "PROCALCITON", "LATICACIDVEN" in the last 168 hours.  No results found for this or any previous visit (from the past 240 hour(s)).       Radiology Studies: No results found.      Scheduled Meds:  enoxaparin (LOVENOX) injection  40 mg Subcutaneous Q24H   famotidine  20 mg Oral BID   irbesartan  150 mg Oral Daily   lipase/protease/amylase  36,000 Units Oral TID WC   sodium chloride flush  3 mL Intravenous Q12H   Continuous Infusions:  sodium chloride 100 mL/hr at 09/23/21 0832     LOS: 3 days    Time spent: 35 minutes    Barb Merino, MD [facility] Pager [phone]

## 2021-09-24 DIAGNOSIS — R197 Diarrhea, unspecified: Secondary | ICD-10-CM | POA: Diagnosis not present

## 2021-09-24 DIAGNOSIS — R112 Nausea with vomiting, unspecified: Secondary | ICD-10-CM | POA: Diagnosis not present

## 2021-09-24 NOTE — Plan of Care (Signed)
  Problem: Pain Managment: Goal: General experience of comfort will improve Outcome: Progressing   Problem: Safety: Goal: Ability to remain free from injury will improve Outcome: Progressing   Problem: Skin Integrity: Goal: Risk for impaired skin integrity will decrease Outcome: Progressing   

## 2021-09-24 NOTE — Progress Notes (Signed)
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  Intermittent bloody emesis.  Multiple recent admissions due to abdominal pain suspected secondary to chronic pancreatitis.  Hemodynamically stable in the ER. Treated with symptomatic treatment, remains in the hospital due to intolerance to diet and pain not controlled.   Assessment & Plan:   Acute on chronic pancreatitis secondary to pancreatic divisum: Suspect opioid dependence.  Acute on chronic abdominal pain: Persistent abdominal pain and recurrent problem.  Still not tolerating advancement of diet.  Needing frequent pain medications nausea medications.  Keep on clears.  Adequate IV opiates and IV nausea medications.  Continue IV fluids.  Recheck hemoglobin and electrolytes tomorrow morning.  Hematemesis and hematochezia: No demonstrable blood in vomitus.  Hemoglobin stable. Currently no indication for endoscopy as he has not demonstrated any bleeding in the hospital.  Essential hypertension: Blood pressures stable today. Hydralazine as needed.  On telmisartan.  GERD: PPI.  Hypokalemia: Replaced with improvement.  Recheck tomorrow.  Hypomagnesemia: Replaced with improvement.  Recheck tomorrow.  Remains in the hospital, inadequate pain control and intolerance to diet.   DVT prophylaxis: enoxaparin (LOVENOX) injection 40 mg Start: 09/19/21 2200   Code Status: Full code Family Communication: None Disposition Plan: Status is: Inpatient.  Remains inpatient because of persistent pain, intolerance to diet.   Consultants:  None  Procedures:  None  Antimicrobials:  None   Subjective:  Seen and examined.  Intermittent nausea but no vomiting.  Able to tolerate clear  liquids.  He still has intermittent severe epigastric pain.  Passing flatus.  No BM.  Objective: Vitals:   09/23/21 1647 09/23/21 2052 09/24/21 0536 09/24/21 0919  BP: (!) 153/96 (!) 177/93 (!) 150/88 (!) 149/99  Pulse: (!) 54 (!) 56 66 (!) 58  Resp: '18 17 16 16  '$ Temp: 98 F (36.7 C) 98.2 F (36.8 C) 98 F (36.7 C) 98.4 F (36.9 C)  TempSrc: Oral Oral Oral Oral  SpO2: 99% 100% 100% 100%  Weight:      Height:        Intake/Output Summary (Last 24 hours) at 09/24/2021 1046 Last data filed at 09/24/2021 5277 Gross per 24 hour  Intake 3168.1 ml  Output 0 ml  Net 3168.1 ml   Filed Weights   09/19/21 2147  Weight: 72.4 kg    Examination:  General exam: Looks comfortable at rest.  Anxious on examination. Respiratory system: Clear to auscultation. Respiratory effort normal. Cardiovascular system: S1 & S2 heard, RRR.  Gastrointestinal system: Soft.  Mild tenderness in the epigastrium without rigidity or guarding. Central nervous system: Alert and oriented. No focal neurological deficits. Extremities: Symmetric 5 x 5 power.   Data Reviewed: I have personally reviewed following labs and imaging studies  CBC: Recent Labs  Lab 09/18/21 2320 09/19/21 1956 09/20/21 0456 09/21/21 0337  WBC 6.6  --  7.6 6.3  NEUTROABS  --   --   --  3.9  HGB 13.7 12.7* 13.1 12.1*  HCT 39.1 37.4* 37.0* 36.0*  MCV 100.5*  --  98.9 102.0*  PLT 214  --  177 824   Basic Metabolic Panel: Recent Labs  Lab 09/18/21 2320 09/20/21 0456 09/21/21 0337  NA 137 135 134*  K 3.6 3.2* 3.8  CL 105 104 104  CO2  19* 24 25  GLUCOSE 98 103* 96  BUN 8 <5* <5*  CREATININE 1.08 1.13 0.97  CALCIUM 9.1 8.4* 8.5*  MG  --   --  1.6*  PHOS  --   --  2.6   GFR: Estimated Creatinine Clearance: 92.3 mL/min (by C-G formula based on SCr of 0.97 mg/dL). Liver Function Tests: Recent Labs  Lab 09/18/21 2320 09/20/21 0456 09/21/21 0337  AST 50* 47* 28  ALT '28 26 20  '$ ALKPHOS 63 59 52  BILITOT 1.1 1.7* 1.4*   PROT 7.3 6.4* 6.2*  ALBUMIN 4.0 3.4* 3.3*   Recent Labs  Lab 09/18/21 2320 09/21/21 0337  LIPASE 45 58*   No results for input(s): "AMMONIA" in the last 168 hours. Coagulation Profile: No results for input(s): "INR", "PROTIME" in the last 168 hours. Cardiac Enzymes: No results for input(s): "CKTOTAL", "CKMB", "CKMBINDEX", "TROPONINI" in the last 168 hours. BNP (last 3 results) No results for input(s): "PROBNP" in the last 8760 hours. HbA1C: No results for input(s): "HGBA1C" in the last 72 hours. CBG: No results for input(s): "GLUCAP" in the last 168 hours. Lipid Profile: No results for input(s): "CHOL", "HDL", "LDLCALC", "TRIG", "CHOLHDL", "LDLDIRECT" in the last 72 hours. Thyroid Function Tests: No results for input(s): "TSH", "T4TOTAL", "FREET4", "T3FREE", "THYROIDAB" in the last 72 hours. Anemia Panel: No results for input(s): "VITAMINB12", "FOLATE", "FERRITIN", "TIBC", "IRON", "RETICCTPCT" in the last 72 hours. Sepsis Labs: No results for input(s): "PROCALCITON", "LATICACIDVEN" in the last 168 hours.  No results found for this or any previous visit (from the past 240 hour(s)).       Radiology Studies: No results found.      Scheduled Meds:  enoxaparin (LOVENOX) injection  40 mg Subcutaneous Q24H   famotidine  20 mg Oral BID   irbesartan  150 mg Oral Daily   lipase/protease/amylase  36,000 Units Oral TID WC   sodium chloride flush  3 mL Intravenous Q12H   Continuous Infusions:  sodium chloride 100 mL/hr at 09/24/21 0133     LOS: 4 days    Time spent: 35 minutes    Barb Merino, MD [facility] Pager [phone]

## 2021-09-25 DIAGNOSIS — R197 Diarrhea, unspecified: Secondary | ICD-10-CM | POA: Diagnosis not present

## 2021-09-25 DIAGNOSIS — R112 Nausea with vomiting, unspecified: Secondary | ICD-10-CM | POA: Diagnosis not present

## 2021-09-25 LAB — CBC WITH DIFFERENTIAL/PLATELET
Abs Immature Granulocytes: 0.06 10*3/uL (ref 0.00–0.07)
Basophils Absolute: 0 10*3/uL (ref 0.0–0.1)
Basophils Relative: 1 %
Eosinophils Absolute: 0.1 10*3/uL (ref 0.0–0.5)
Eosinophils Relative: 2 %
HCT: 38.7 % — ABNORMAL LOW (ref 39.0–52.0)
Hemoglobin: 13.2 g/dL (ref 13.0–17.0)
Immature Granulocytes: 1 %
Lymphocytes Relative: 43 %
Lymphs Abs: 2.2 10*3/uL (ref 0.7–4.0)
MCH: 34.4 pg — ABNORMAL HIGH (ref 26.0–34.0)
MCHC: 34.1 g/dL (ref 30.0–36.0)
MCV: 100.8 fL — ABNORMAL HIGH (ref 80.0–100.0)
Monocytes Absolute: 0.4 10*3/uL (ref 0.1–1.0)
Monocytes Relative: 8 %
Neutro Abs: 2.4 10*3/uL (ref 1.7–7.7)
Neutrophils Relative %: 45 %
Platelets: 177 10*3/uL (ref 150–400)
RBC: 3.84 MIL/uL — ABNORMAL LOW (ref 4.22–5.81)
RDW: 16.4 % — ABNORMAL HIGH (ref 11.5–15.5)
WBC: 5.2 10*3/uL (ref 4.0–10.5)
nRBC: 0 % (ref 0.0–0.2)

## 2021-09-25 LAB — COMPREHENSIVE METABOLIC PANEL
ALT: 31 U/L (ref 0–44)
AST: 38 U/L (ref 15–41)
Albumin: 3.7 g/dL (ref 3.5–5.0)
Alkaline Phosphatase: 54 U/L (ref 38–126)
Anion gap: 9 (ref 5–15)
BUN: <5 mg/dL — ABNORMAL LOW (ref 6–20)
CO2: 25 mmol/L (ref 22–32)
Calcium: 9.4 mg/dL (ref 8.9–10.3)
Chloride: 103 mmol/L (ref 98–111)
Creatinine, Ser: 1.05 mg/dL (ref 0.61–1.24)
GFR, Estimated: >60 mL/min (ref >60)
Glucose, Bld: 95 mg/dL (ref 70–99)
Potassium: 3.8 mmol/L (ref 3.5–5.1)
Sodium: 137 mmol/L (ref 135–145)
Total Bilirubin: 1.3 mg/dL — ABNORMAL HIGH (ref 0.3–1.2)
Total Protein: 7 g/dL (ref 6.5–8.1)

## 2021-09-25 LAB — MAGNESIUM: Magnesium: 1.6 mg/dL — ABNORMAL LOW (ref 1.7–2.4)

## 2021-09-25 LAB — LIPASE, BLOOD: Lipase: 20 U/L (ref 11–51)

## 2021-09-25 LAB — PHOSPHORUS: Phosphorus: 3.9 mg/dL (ref 2.5–4.6)

## 2021-09-25 MED ORDER — BOOST / RESOURCE BREEZE PO LIQD CUSTOM
1.0000 | Freq: Three times a day (TID) | ORAL | Status: DC
  Administered 2021-09-25 – 2021-09-26 (×3): 1 via ORAL

## 2021-09-25 MED ORDER — MAGNESIUM SULFATE 2 GM/50ML IV SOLN
2.0000 g | Freq: Once | INTRAVENOUS | Status: AC
  Administered 2021-09-25: 2 g via INTRAVENOUS
  Filled 2021-09-25: qty 50

## 2021-09-25 NOTE — TOC Progression Note (Addendum)
Transition of Care [facility]) - Progression Note    Patient Details  Name: Kyle Berry MRN: [id number] Date of Birth: 18-Aug-1970  Transition of Care [facility]) CM/SW Contact  Tom-Johnson, Renea Ee, RN Phone Number: 09/25/2021, 4:01 PM  Clinical Narrative:     Patient continues to have abdominal pains, able to tolerated clear liquids. Diet advanced to Full liquids today.  Per MD, If patient is able to keep up with full liquid diet and oral pain medications, will plan discharge on prescription of opiates. MD filled out Medication Assistance Application form and CM gave to patient to fill out his own part and then send for acceptance. CM will continue to follow with needs.   Expected Discharge Plan: Home/Self Care Barriers to Discharge: Continued Medical Work up  Expected Discharge Plan and Services Expected Discharge Plan: Home/Self Care   Discharge Planning Services: CM Consult, Medication Assistance Post Acute Care Choice: NA Living arrangements for the past 2 months: Single Family Home                 DME Arranged: N/A DME Agency: NA       HH Arranged: NA HH Agency: NA         Social Determinants of Health (SDOH) Interventions    Readmission Risk Interventions    11/03/2020    2:19 PM  Readmission Risk Prevention Plan  Transportation Screening Complete  PCP or Specialist Appt within 3-5 Days Complete  HRI or [place] Complete  Social Work Consult for [place] Planning/Counseling Complete  Palliative Care Screening Not Applicable

## 2021-09-25 NOTE — Progress Notes (Signed)
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  Intermittent bloody emesis.  Multiple recent admissions due to abdominal pain suspected secondary to chronic pancreatitis.  Hemodynamically stable in the ER. Treated with symptomatic treatment, remains in the hospital due to intolerance to diet and pain not controlled.   Assessment & Plan:   Acute on chronic pancreatitis secondary to pancreatic divisum: Suspect opioid dependence.  Acute on chronic abdominal pain: Continues to have abdominal pain.  Tolerated clears. Challenge with full liquid diet today.  Adequate IV and oral opiates for pain medications.  Encourage oral intake.  Electrolytes are adequate. If able to keep up with full liquid diet and oral pain medications, will plan discharge on prescription of opiates.  Hematemesis and hematochezia: No demonstrable blood in vomitus.  Hemoglobin stable. Currently no indication for endoscopy as he has not demonstrated any bleeding in the hospital.  Essential hypertension: Blood pressures stable today. Hydralazine as needed.  On telmisartan.  GERD: PPI.  Hypokalemia: Replaced with improvement.  Adequate.  Hypomagnesemia: Replace further today.  Remains in the hospital, inadequate pain control and intolerance to diet.   DVT prophylaxis: enoxaparin (LOVENOX) injection 40 mg Start: 09/19/21 2200   Code Status: Full code Family Communication: None Disposition Plan: Status is: Inpatient.  Remains inpatient because of persistent pain, intolerance to diet.   Consultants:  None  Procedures:  None  Antimicrobials:  None   Subjective:  Patient seen and examined.  Nausea but no vomiting.  Still gets intermittent pain.  Wants  to try full liquid diet and some Ensure today.  Objective: Vitals:   09/24/21 0919 09/24/21 1636 09/24/21 2107 09/25/21 0948  BP: (!) 149/99 (!) 140/94 (!) 147/93 137/88  Pulse: (!) 58 61 70 (!) 55  Resp: '16 19 18 17  '$ Temp: 98.4 F (36.9 C) 97.7 F (36.5 C) 98.6 F (37 C) 98.1 F (36.7 C)  TempSrc: Oral Oral Oral Oral  SpO2: 100% 100% 99% 98%  Weight:      Height:        Intake/Output Summary (Last 24 hours) at 09/25/2021 1054 Last data filed at 09/25/2021 0600 Gross per 24 hour  Intake 3212.78 ml  Output 0 ml  Net 3212.78 ml   Filed Weights   09/19/21 2147  Weight: 72.4 kg    Examination:  General exam: Looks fairly comfortable. Respiratory system: Clear to auscultation. Respiratory effort normal. Cardiovascular system: S1 & S2 heard, RRR.  Gastrointestinal system: Soft.  Minimal epigastric tenderness.  No rigidity or guarding. Central nervous system: Alert and oriented. No focal neurological deficits. Extremities: Symmetric 5 x 5 power.   Data Reviewed: I have personally reviewed following labs and imaging studies  CBC: Recent Labs  Lab 09/18/21 2320 09/19/21 1956 09/20/21 0456 09/21/21 0337 09/25/21 0248  WBC 6.6  --  7.6 6.3 5.2  NEUTROABS  --   --   --  3.9 2.4  HGB 13.7 12.7* 13.1 12.1* 13.2  HCT 39.1 37.4* 37.0* 36.0* 38.7*  MCV 100.5*  --  98.9 102.0* 100.8*  PLT 214  --  177 179 941   Basic Metabolic Panel: Recent Labs  Lab 09/18/21 2320 09/20/21 0456 09/21/21 0337 09/25/21 0248  NA 137 135 134* 137  K 3.6 3.2* 3.8 3.8  CL 105  104 104 103  CO2 19* '24 25 25  '$ GLUCOSE 98 103* 96 95  BUN 8 <5* <5* <5*  CREATININE 1.08 1.13 0.97 1.05  CALCIUM 9.1 8.4* 8.5* 9.4  MG  --   --  1.6* 1.6*  PHOS  --   --  2.6 3.9   GFR: Estimated Creatinine Clearance: 85.2 mL/min (by C-G formula based on SCr of 1.05 mg/dL). Liver Function Tests: Recent Labs  Lab 09/18/21 2320 09/20/21 0456 09/21/21 0337 09/25/21 0248  AST 50* 47* 28 38  ALT '28 26 20 31   '$ ALKPHOS 63 59 52 54  BILITOT 1.1 1.7* 1.4* 1.3*  PROT 7.3 6.4* 6.2* 7.0  ALBUMIN 4.0 3.4* 3.3* 3.7   Recent Labs  Lab 09/18/21 2320 09/21/21 0337 09/25/21 0248  LIPASE 45 58* 20   No results for input(s): "AMMONIA" in the last 168 hours. Coagulation Profile: No results for input(s): "INR", "PROTIME" in the last 168 hours. Cardiac Enzymes: No results for input(s): "CKTOTAL", "CKMB", "CKMBINDEX", "TROPONINI" in the last 168 hours. BNP (last 3 results) No results for input(s): "PROBNP" in the last 8760 hours. HbA1C: No results for input(s): "HGBA1C" in the last 72 hours. CBG: No results for input(s): "GLUCAP" in the last 168 hours. Lipid Profile: No results for input(s): "CHOL", "HDL", "LDLCALC", "TRIG", "CHOLHDL", "LDLDIRECT" in the last 72 hours. Thyroid Function Tests: No results for input(s): "TSH", "T4TOTAL", "FREET4", "T3FREE", "THYROIDAB" in the last 72 hours. Anemia Panel: No results for input(s): "VITAMINB12", "FOLATE", "FERRITIN", "TIBC", "IRON", "RETICCTPCT" in the last 72 hours. Sepsis Labs: No results for input(s): "PROCALCITON", "LATICACIDVEN" in the last 168 hours.  No results found for this or any previous visit (from the past 240 hour(s)).       Radiology Studies: No results found.      Scheduled Meds:  enoxaparin (LOVENOX) injection  40 mg Subcutaneous Q24H   famotidine  20 mg Oral BID   feeding supplement  1 Container Oral TID BM   irbesartan  150 mg Oral Daily   lipase/protease/amylase  36,000 Units Oral TID WC   sodium chloride flush  3 mL Intravenous Q12H   Continuous Infusions:  sodium chloride 100 mL/hr at 09/25/21 0730     LOS: 5 days    Time spent: 35 minutes    Barb Merino, MD [facility] Pager [phone]

## 2021-09-26 DIAGNOSIS — R197 Diarrhea, unspecified: Secondary | ICD-10-CM | POA: Diagnosis not present

## 2021-09-26 DIAGNOSIS — R112 Nausea with vomiting, unspecified: Secondary | ICD-10-CM | POA: Diagnosis not present

## 2021-09-26 MED ORDER — OXYCODONE HCL 5 MG PO TABS: 5.0000 mg | ORAL_TABLET | ORAL | 0 refills | Status: AC | PRN

## 2021-09-26 MED ORDER — FAMOTIDINE 20 MG PO TABS: 20.0000 mg | ORAL_TABLET | Freq: Two times a day (BID) | ORAL | 5 refills | Status: DC

## 2021-09-26 NOTE — Plan of Care (Signed)
  Problem: Health Behavior/Discharge Planning: Goal: Ability to manage health-related needs will improve Outcome: Completed/Met   Problem: Clinical Measurements: Goal: Ability to maintain clinical measurements within normal limits will improve Outcome: Completed/Met Goal: Diagnostic test results will improve Outcome: Completed/Met

## 2021-09-26 NOTE — TOC Transition Note (Signed)
Transition of Care [facility]) - CM/SW Discharge Note   Patient Details  Name: Kyle Berry MRN: [id number] Date of Birth: Apr 03, 1971  Transition of Care [facility]) CM/SW Contact:  Tom-Johnson, Renea Ee, RN Phone Number: 09/26/2021, 9:55 AM   Clinical Narrative:     Patient is scheduled for discharge today. No PT/OT needs or recommendations noted. Denies any other needs. Family to transport at discharge. No further TOC needs noted.  Final next level of care: Home/Self Care Barriers to Discharge: Barriers Resolved   Patient Goals and CMS Choice Patient states their goals for this hospitalization and ongoing recovery are:: To return home CMS Medicare.gov Compare Post Acute Care list provided to:: Patient Choice offered to / list presented to : Patient  Discharge Placement                Patient to be transferred to facility by: Family      Discharge Plan and Services   Discharge Planning Services: CM Consult, Medication Assistance Post Acute Care Choice: NA          DME Arranged: N/A DME Agency: NA       HH Arranged: NA HH Agency: NA        Social Determinants of Health (SDOH) Interventions     Readmission Risk Interventions    11/03/2020    2:19 PM  Readmission Risk Prevention Plan  Transportation Screening Complete  PCP or Specialist Appt within 3-5 Days Complete  HRI or Home Care Consult Complete  Social Work Consult for [place] Planning/Counseling Complete  Palliative Care Screening Not Applicable

## 2021-09-26 NOTE — Progress Notes (Signed)
DISCHARGE NOTE HOME Kyle Berry to be discharged Home per MD order. Discussed prescriptions and follow up appointments with the patient. Prescriptions given to patient; medication list explained in detail. Patient verbalized understanding.  Skin clean, dry and intact without evidence of skin break down, no evidence of skin tears noted. IV catheter discontinued intact. Site without signs and symptoms of complications. Dressing and pressure applied. Pt denies pain at the site currently. No complaints noted.  Patient free of lines, drains, and wounds.   An After Visit Summary (AVS) was printed and given to the patient. Patient escorted via wheelchair, and discharged home via private auto.  Arlyss Repress, RN

## 2021-09-26 NOTE — Discharge Summary (Signed)
Physician Discharge Summary  Kyle Berry FKC:[id number] DOB: [birth year] DOA: 09/18/2021  PCP: [name] (Inactive)  Admit date: 09/18/2021 Discharge date: 09/26/2021  Admitted From: Home Disposition: Home  Recommendations for Outpatient Follow-up:  Follow up with PCP in 1-2 weeks Please obtain CMP/CBC in one week Please follow-up with your pain management clinic, you will need long-term pain management.  Home Health: N/A Equipment/Devices: N/A  Discharge Condition: Stable CODE STATUS: Full code Diet recommendation: Full liquid diet, soft and easily digestible food.  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  Trace bleeding with vomiting.  Multiple recent admissions with epigastric pain needing prolonged hospitalization, IV fluid and IV opiates for pain relief.  On presentation, lipase was normal, CT scan with mostly chronic findings.  He remained in the hospital due to intolerance to diet, ongoing abdominal pain and difficulties tolerating oral symptom control medications. He was given IV magnesium replacement in the hospital.  Other electrolytes remained stable.  After many days in the hospital now he is able to tolerate full liquid diet, his pain is controlled with oxycodone, occasionally using Zofran for nausea.  He has normal bowel function.  Able to go home today.  Advised him to follow-up with his gastroenterologist as well pain management clinic.  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  I did discuss this with him and he is agreeable to make appointment with his previous pain management clinic.    Discharge Diagnoses:  Principal Problem:   Nausea, vomiting, and  diarrhea Active Problems:   Chronic pancreatitis (HCC)   Pancreatic divisum   Hematemesis   Hematochezia   Essential hypertension   Esophageal reflux   Congenital single kidney   Pancreatitis, acute    Discharge Instructions  Discharge Instructions     Call MD for:  persistant nausea and vomiting   Complete by: As directed    Call MD for:  severe uncontrolled pain   Complete by: As directed    Diet - low sodium heart healthy   Complete by: As directed    Increase activity slowly   Complete by: As directed       Allergies as of 09/26/2021       Reactions   Diclofenac Hives, Other (See Comments)   Fluid buildup in chest   Norvasc [amlodipine Besylate] Other (See Comments)   Fluid buildup in chest   Omeprazole Other (See Comments)   MD stopped due to pancreatitis   Hydrochlorothiazide    07/31/2016 -  07/20/2016 -  05/28/2016 -   Other Reaction(s) from [place]: itching   Prednisone Hives, Other (See Comments)   Mood swings        Medication List     STOP taking these medications    potassium chloride SA 20 MEQ tablet Commonly known as: KLOR-CON M   promethazine 25 MG tablet Commonly known as: PHENERGAN       TAKE these medications    acetaminophen 500 MG tablet Commonly known as: TYLENOL Take 500 mg by mouth 2 (two) times daily as needed (pain).   famotidine 20 MG tablet Commonly known as: PEPCID Take 1 tablet (20 mg total) by mouth 2 (two) times daily. What changed: additional instructions   feeding supplement Liqd Take 237 mLs by  mouth 2 (two) times daily between meals. What changed:  when to take this additional instructions   lipase/protease/amylase 36000 UNITS Cpep capsule Commonly known as: CREON Take 2 capsules (72,000 Units total) by mouth 3 (three) times daily with meals.   ondansetron 4 MG disintegrating tablet Commonly known as: ZOFRAN-ODT Take 1 tablet (4 mg total) by mouth every 8 (eight) hours as needed for nausea or  vomiting. What changed: when to take this   oxyCODONE 5 MG immediate release tablet Commonly known as: Oxy IR/ROXICODONE Take 1 tablet (5 mg total) by mouth every 4 (four) hours as needed for up to 5 days for moderate pain, severe pain or breakthrough pain.   telmisartan 40 MG tablet Commonly known as: MICARDIS Take 1 tablet (40 mg total) by mouth daily.   VISINE-A OP Place 2 drops into both eyes daily.        Allergies  Allergen Reactions   Diclofenac Hives and Other (See Comments)    Fluid buildup in chest   Norvasc [Amlodipine Besylate] Other (See Comments)    Fluid buildup in chest   Omeprazole Other (See Comments)    MD stopped due to pancreatitis   Hydrochlorothiazide     07/31/2016 -  07/20/2016 -  05/28/2016 -   Other Reaction(s) from [place]: itching   Prednisone Hives and Other (See Comments)    Mood swings    Consultations: None   Procedures/Studies: CT Abdomen Pelvis W Contrast  Result Date: 09/19/2021 CLINICAL DATA:  Abdominal pain EXAM: CT ABDOMEN AND PELVIS WITH CONTRAST TECHNIQUE: Multidetector CT imaging of the abdomen and pelvis was performed using the standard protocol following bolus administration of intravenous contrast. RADIATION DOSE REDUCTION: This exam was performed according to the departmental dose-optimization program which includes automated exposure control, adjustment of the mA and/or kV according to patient size and/or use of iterative reconstruction technique. CONTRAST:  88m OMNIPAQUE IOHEXOL 300 MG/ML  SOLN COMPARISON:  None Available. FINDINGS: Lower chest: Lung bases are clear. Hepatobiliary: No focal hepatic lesion. Postcholecystectomy. No biliary dilatation. Pancreas: Pancreas is normal. No ductal dilatation. No pancreatic inflammation. Spleen: Normal spleen Adrenals/urinary tract: Adrenal glands normal. RIGHT kidney is severely atrophic. LEFT kidney normal. LEFT ureter and bladder normal. Stomach/Bowel: Stomach, small bowel,  appendix, and cecum are normal. Multiple diverticula of the descending colon and sigmoid colon without acute inflammation. Vascular/Lymphatic: Abdominal aorta is normal caliber. No periportal or retroperitoneal adenopathy. No pelvic adenopathy. Reproductive: Prostate unremarkable Other: No inguinal or abdominal hernia. Musculoskeletal: No aggressive osseous lesion. IMPRESSION: 1. No acute findings in the abdomen pelvis. 2. Severely atrophic RIGHT kidney. 3. Post cholecystectomy. 4. Normal appendix. Electronically Signed   By: SSuzy BouchardM.D.   On: 09/19/2021 16:15   (Echo, Carotid, EGD, Colonoscopy, ERCP)    Subjective: Patient seen and examined in the morning rounds.  He is able to tolerate full liquid diet.  Occasional epigastric pain using IV pain medications, however he can manage that with oxycodone also.  Comfortable with plan to go home with short-term prescriptions of oxycodone.   Discharge Exam: Vitals:   09/26/21 0458 09/26/21 0459  BP: (!) 154/91 (!) 154/91  Pulse: (!) 52 (!) 57  Resp: 20 20  Temp: 98 F (36.7 C) 98 F (36.7 C)  SpO2: 99% 99%   Vitals:   09/25/21 1649 09/25/21 2031 09/26/21 0458 09/26/21 0459  BP: 134/85 (!) 149/110 (!) 154/91 (!) 154/91  Pulse: (!) 59 69 (!) 52 (!) 57  Resp: '18 18 20 '$ 20  Temp: 98.2 F (36.8 C) 98.3 F (36.8 C) 98 F (36.7 C) 98 F (36.7 C)  TempSrc:  Oral Oral Oral  SpO2: 100% 100% 99% 99%  Weight:      Height:        General: Pt is alert, awake, not in acute distress Cardiovascular: RRR, S1/S2 +, no rubs, no gallops Respiratory: CTA bilaterally, no wheezing, no rhonchi Abdominal: Soft, mild epigastric tenderness on deep palpation. ND, bowel sounds + Extremities: no edema, no cyanosis    The results of significant diagnostics from this hospitalization (including imaging, microbiology, ancillary and laboratory) are listed below for reference.     Microbiology: No results found for this or any previous visit (from the  past 240 hour(s)).   Labs: BNP (last 3 results) Recent Labs    12/23/20 0652  BNP 20.1   Basic Metabolic Panel: Recent Labs  Lab 09/20/21 0456 09/21/21 0337 09/25/21 0248  NA 135 134* 137  K 3.2* 3.8 3.8  CL 104 104 103  CO2 '24 25 25  '$ GLUCOSE 103* 96 95  BUN <5* <5* <5*  CREATININE 1.13 0.97 1.05  CALCIUM 8.4* 8.5* 9.4  MG  --  1.6* 1.6*  PHOS  --  2.6 3.9   Liver Function Tests: Recent Labs  Lab 09/20/21 0456 09/21/21 0337 09/25/21 0248  AST 47* 28 38  ALT '26 20 31  '$ ALKPHOS 59 52 54  BILITOT 1.7* 1.4* 1.3*  PROT 6.4* 6.2* 7.0  ALBUMIN 3.4* 3.3* 3.7   Recent Labs  Lab 09/21/21 0337 09/25/21 0248  LIPASE 58* 20   No results for input(s): "AMMONIA" in the last 168 hours. CBC: Recent Labs  Lab 09/19/21 1956 09/20/21 0456 09/21/21 0337 09/25/21 0248  WBC  --  7.6 6.3 5.2  NEUTROABS  --   --  3.9 2.4  HGB 12.7* 13.1 12.1* 13.2  HCT 37.4* 37.0* 36.0* 38.7*  MCV  --  98.9 102.0* 100.8*  PLT  --  177 179 177   Cardiac Enzymes: No results for input(s): "CKTOTAL", "CKMB", "CKMBINDEX", "TROPONINI" in the last 168 hours. BNP: Invalid input(s): "POCBNP" CBG: No results for input(s): "GLUCAP" in the last 168 hours. D-Dimer No results for input(s): "DDIMER" in the last 72 hours. Hgb A1c No results for input(s): "HGBA1C" in the last 72 hours. Lipid Profile No results for input(s): "CHOL", "HDL", "LDLCALC", "TRIG", "CHOLHDL", "LDLDIRECT" in the last 72 hours. Thyroid function studies No results for input(s): "TSH", "T4TOTAL", "T3FREE", "THYROIDAB" in the last 72 hours.  Invalid input(s): "FREET3" Anemia work up No results for input(s): "VITAMINB12", "FOLATE", "FERRITIN", "TIBC", "IRON", "RETICCTPCT" in the last 72 hours. Urinalysis    Component Value Date/Time   COLORURINE YELLOW 09/18/2021 2311   APPEARANCEUR CLEAR 09/18/2021 2311   LABSPEC 1.028 09/18/2021 2311   PHURINE 5.0 09/18/2021 2311   GLUCOSEU NEGATIVE 09/18/2021 2311   HGBUR NEGATIVE  09/18/2021 2311   BILIRUBINUR NEGATIVE 09/18/2021 2311   KETONESUR 5 (A) 09/18/2021 2311   PROTEINUR 100 (A) 09/18/2021 2311   UROBILINOGEN 0.2 01/20/2015 1643   NITRITE NEGATIVE 09/18/2021 2311   LEUKOCYTESUR NEGATIVE 09/18/2021 2311   Sepsis Labs Recent Labs  Lab 09/20/21 0456 09/21/21 0337 09/25/21 0248  WBC 7.6 6.3 5.2   Microbiology No results found for this or any previous visit (from the past 240 hour(s)).   Time coordinating discharge: 32 minutes  SIGNED:   Barb Merino, MD  [facility] 09/26/2021, 8:31 AM

## 2021-10-12 ENCOUNTER — Other Ambulatory Visit (HOSPITAL_COMMUNITY): Payer: Self-pay

## 2021-10-24 ENCOUNTER — Observation Stay (HOSPITAL_COMMUNITY)
Admission: EM | Admit: 2021-10-24 | Discharge: 2021-10-27 | Disposition: A | Discharge status: Home / Self Care | Payer: Managed Care, Other (non HMO) | Attending: Internal Medicine | Admitting: Internal Medicine

## 2021-10-24 ENCOUNTER — Other Ambulatory Visit: Payer: Self-pay

## 2021-10-24 ENCOUNTER — Emergency Department (HOSPITAL_COMMUNITY): Payer: Managed Care, Other (non HMO)

## 2021-10-24 ENCOUNTER — Encounter (HOSPITAL_COMMUNITY): Payer: Self-pay

## 2021-10-24 DIAGNOSIS — R1110 Vomiting, unspecified: Secondary | ICD-10-CM

## 2021-10-24 DIAGNOSIS — K625 Hemorrhage of anus and rectum: Secondary | ICD-10-CM | POA: Insufficient documentation

## 2021-10-24 DIAGNOSIS — Z79899 Other long term (current) drug therapy: Secondary | ICD-10-CM | POA: Diagnosis not present

## 2021-10-24 DIAGNOSIS — E440 Moderate protein-calorie malnutrition: Secondary | ICD-10-CM | POA: Insufficient documentation

## 2021-10-24 DIAGNOSIS — E876 Hypokalemia: Secondary | ICD-10-CM | POA: Diagnosis not present

## 2021-10-24 DIAGNOSIS — N179 Acute kidney failure, unspecified: Secondary | ICD-10-CM | POA: Diagnosis not present

## 2021-10-24 DIAGNOSIS — K760 Fatty (change of) liver, not elsewhere classified: Secondary | ICD-10-CM | POA: Insufficient documentation

## 2021-10-24 DIAGNOSIS — D7589 Other specified diseases of blood and blood-forming organs: Secondary | ICD-10-CM | POA: Diagnosis not present

## 2021-10-24 DIAGNOSIS — R112 Nausea with vomiting, unspecified: Secondary | ICD-10-CM | POA: Diagnosis not present

## 2021-10-24 DIAGNOSIS — I10 Essential (primary) hypertension: Secondary | ICD-10-CM | POA: Insufficient documentation

## 2021-10-24 LAB — CBC WITH DIFFERENTIAL/PLATELET
Abs Immature Granulocytes: 0.01 10*3/uL (ref 0.00–0.07)
Basophils Absolute: 0 10*3/uL (ref 0.0–0.1)
Basophils Relative: 0 %
Eosinophils Absolute: 0 10*3/uL (ref 0.0–0.5)
Eosinophils Relative: 1 %
HCT: 40.5 % (ref 39.0–52.0)
Hemoglobin: 13.2 g/dL (ref 13.0–17.0)
Immature Granulocytes: 0 %
Lymphocytes Relative: 31 %
Lymphs Abs: 2.2 10*3/uL (ref 0.7–4.0)
MCH: 34.7 pg — ABNORMAL HIGH (ref 26.0–34.0)
MCHC: 32.6 g/dL (ref 30.0–36.0)
MCV: 106.6 fL — ABNORMAL HIGH (ref 80.0–100.0)
Monocytes Absolute: 0.6 10*3/uL (ref 0.1–1.0)
Monocytes Relative: 9 %
Neutro Abs: 4.4 10*3/uL (ref 1.7–7.7)
Neutrophils Relative %: 59 %
Platelets: 165 10*3/uL (ref 150–400)
RBC: 3.8 MIL/uL — ABNORMAL LOW (ref 4.22–5.81)
RDW: 15.9 % — ABNORMAL HIGH (ref 11.5–15.5)
WBC: 7.3 10*3/uL (ref 4.0–10.5)
nRBC: 0 % (ref 0.0–0.2)

## 2021-10-24 LAB — COMPREHENSIVE METABOLIC PANEL
ALT: 25 U/L (ref 0–44)
AST: 49 U/L — ABNORMAL HIGH (ref 15–41)
Albumin: 3.7 g/dL (ref 3.5–5.0)
Alkaline Phosphatase: 64 U/L (ref 38–126)
Anion gap: 13 (ref 5–15)
BUN: 9 mg/dL (ref 6–20)
CO2: 19 mmol/L — ABNORMAL LOW (ref 22–32)
Calcium: 9 mg/dL (ref 8.9–10.3)
Chloride: 105 mmol/L (ref 98–111)
Creatinine, Ser: 1.16 mg/dL (ref 0.61–1.24)
GFR, Estimated: >60 mL/min (ref >60)
Glucose, Bld: 126 mg/dL — ABNORMAL HIGH (ref 70–99)
Potassium: 3.3 mmol/L — ABNORMAL LOW (ref 3.5–5.1)
Sodium: 137 mmol/L (ref 135–145)
Total Bilirubin: 1.1 mg/dL (ref 0.3–1.2)
Total Protein: 7.1 g/dL (ref 6.5–8.1)

## 2021-10-24 LAB — TYPE AND SCREEN
ABO/RH(D): O POS
Antibody Screen: NEGATIVE

## 2021-10-24 LAB — LIPASE, BLOOD: Lipase: 41 U/L (ref 11–51)

## 2021-10-24 NOTE — ED Provider Triage Note (Signed)
Emergency Medicine Provider Triage Evaluation Note  Kyle Berry , a 51 y.o. male  was evaluated in triage.  Pt complains of abdominal pain, blood in stool and hematemesis.  He has longstanding history of same with history of pancreatic divisum done at [facility].  He follows there and has been in contact with his physician who told him to come to the ER.  He denies any fever.  Review of Systems  Positive: Abdominal pain Negative: fever  Physical Exam  BP (!) 133/96 (BP Location: Left Arm)   Pulse 92   Temp 98.7 F (37.1 C) (Oral)   Resp 17   SpO2 98%  Gen:   Awake, no distress   Resp:  Normal effort  MSK:   Moves extremities without difficulty  Other:  Mild tenderness epigastrium  Medical Decision Making  Medically screening exam initiated at 10:08 PM.  Appropriate orders placed.  [name] was informed that the remainder of the evaluation will be completed by another provider, this initial triage assessment does not replace that evaluation, and the importance of remaining in the ED until their evaluation is complete.  Abdominal pain, hematemesis, rectal bleeding. Hx of same related to pancreatic divisum.  Will check labs, type and screen, CT.   Larene Pickett, PA-C 10/24/21 2221

## 2021-10-24 NOTE — ED Triage Notes (Signed)
Pt reports blood in his stools as well as vomiting up bright red blood, onset last night but states he has been having these issues for about 1-2 months.  He also reports abd pain

## 2021-10-25 ENCOUNTER — Emergency Department (HOSPITAL_COMMUNITY): Payer: Managed Care, Other (non HMO)

## 2021-10-25 ENCOUNTER — Encounter (HOSPITAL_COMMUNITY): Payer: Self-pay | Admitting: Internal Medicine

## 2021-10-25 DIAGNOSIS — R112 Nausea with vomiting, unspecified: Secondary | ICD-10-CM | POA: Diagnosis present

## 2021-10-25 LAB — C-REACTIVE PROTEIN: CRP: <0.5 mg/dL (ref <1.0)

## 2021-10-25 LAB — RAPID URINE DRUG SCREEN, HOSP PERFORMED
Amphetamines: NOT DETECTED
Barbiturates: NOT DETECTED
Benzodiazepines: NOT DETECTED
Cocaine: NOT DETECTED
Opiates: POSITIVE — AB
Tetrahydrocannabinol: NOT DETECTED

## 2021-10-25 LAB — SEDIMENTATION RATE: Sed Rate: 5 mm/hr (ref 0–16)

## 2021-10-25 LAB — HEMOGLOBIN A1C
Hgb A1c MFr Bld: 5.3 % (ref 4.8–5.6)
Mean Plasma Glucose: 105.41 mg/dL

## 2021-10-25 LAB — LACTIC ACID, PLASMA: Lactic Acid, Venous: 0.6 mmol/L (ref 0.5–1.9)

## 2021-10-25 LAB — CK: Total CK: 83 U/L (ref 49–397)

## 2021-10-25 MED ORDER — ONDANSETRON HCL 4 MG/2ML IJ SOLN
4.0000 mg | Freq: Once | INTRAMUSCULAR | Status: AC
Start: 2021-10-25 — End: 2021-10-25
  Administered 2021-10-25: 4 mg via INTRAVENOUS
  Filled 2021-10-25: qty 2

## 2021-10-25 MED ORDER — FENTANYL CITRATE PF 50 MCG/ML IJ SOSY
50.0000 ug | PREFILLED_SYRINGE | Freq: Once | INTRAMUSCULAR | Status: AC
  Administered 2021-10-25: 50 ug via INTRAVENOUS
  Filled 2021-10-25: qty 1

## 2021-10-25 MED ORDER — IOHEXOL 300 MG/ML  SOLN
100.0000 mL | Freq: Once | INTRAMUSCULAR | Status: AC | PRN
Start: 2021-10-25 — End: 2021-10-25
  Administered 2021-10-25: 100 mL via INTRAVENOUS

## 2021-10-25 MED ORDER — ONDANSETRON HCL 4 MG/2ML IJ SOLN
4.0000 mg | Freq: Four times a day (QID) | INTRAMUSCULAR | Status: DC
  Administered 2021-10-25 – 2021-10-26 (×3): 4 mg via INTRAVENOUS
  Filled 2021-10-25 (×3): qty 2

## 2021-10-25 MED ORDER — ACETAMINOPHEN 650 MG RE SUPP: 650.0000 mg | Freq: Four times a day (QID) | RECTAL | Status: DC | PRN

## 2021-10-25 MED ORDER — LACTATED RINGERS IV SOLN: INTRAVENOUS | Status: DC

## 2021-10-25 MED ORDER — SODIUM CHLORIDE 0.9 % IV SOLN
12.5000 mg | Freq: Four times a day (QID) | INTRAVENOUS | Status: DC | PRN
  Administered 2021-10-25: 12.5 mg via INTRAVENOUS
  Filled 2021-10-25: qty 0.5

## 2021-10-25 MED ORDER — HYDROMORPHONE HCL 1 MG/ML IJ SOLN
0.5000 mg | Freq: Once | INTRAMUSCULAR | Status: AC
  Administered 2021-10-25: 0.5 mg via INTRAVENOUS
  Filled 2021-10-25: qty 1

## 2021-10-25 MED ORDER — METOCLOPRAMIDE HCL 5 MG/ML IJ SOLN
5.0000 mg | Freq: Once | INTRAMUSCULAR | Status: AC
  Administered 2021-10-25: 5 mg via INTRAVENOUS
  Filled 2021-10-25: qty 2

## 2021-10-25 MED ORDER — IRBESARTAN 150 MG PO TABS
150.0000 mg | ORAL_TABLET | Freq: Every day | ORAL | Status: DC
  Administered 2021-10-25 – 2021-10-27 (×3): 150 mg via ORAL
  Filled 2021-10-25 (×3): qty 1

## 2021-10-25 MED ORDER — HYDROCODONE-ACETAMINOPHEN 5-325 MG PO TABS
1.0000 | ORAL_TABLET | ORAL | Status: DC | PRN
  Administered 2021-10-25: 2 via ORAL
  Filled 2021-10-25: qty 2

## 2021-10-25 MED ORDER — SODIUM CHLORIDE 0.9 % IV BOLUS
1000.0000 mL | Freq: Once | INTRAVENOUS | Status: AC
  Administered 2021-10-25: 1000 mL via INTRAVENOUS

## 2021-10-25 MED ORDER — SENNOSIDES-DOCUSATE SODIUM 8.6-50 MG PO TABS: 1.0000 | ORAL_TABLET | Freq: Every evening | ORAL | Status: DC | PRN

## 2021-10-25 MED ORDER — ALBUTEROL SULFATE (2.5 MG/3ML) 0.083% IN NEBU: 2.5000 mg | INHALATION_SOLUTION | RESPIRATORY_TRACT | Status: DC | PRN

## 2021-10-25 MED ORDER — SODIUM CHLORIDE 0.9 % IV SOLN: 6.2500 mg | Freq: Four times a day (QID) | INTRAVENOUS | Status: DC | PRN

## 2021-10-25 MED ORDER — FAMOTIDINE IN NACL 20-0.9 MG/50ML-% IV SOLN
20.0000 mg | Freq: Once | INTRAVENOUS | Status: AC
Start: 2021-10-25 — End: 2021-10-25
  Administered 2021-10-25: 20 mg via INTRAVENOUS
  Filled 2021-10-25: qty 50

## 2021-10-25 MED ORDER — ACETAMINOPHEN 325 MG PO TABS
650.0000 mg | ORAL_TABLET | Freq: Four times a day (QID) | ORAL | Status: DC | PRN
  Administered 2021-10-26: 650 mg via ORAL
  Filled 2021-10-25: qty 2

## 2021-10-25 MED ORDER — ENOXAPARIN SODIUM 40 MG/0.4ML IJ SOSY
40.0000 mg | PREFILLED_SYRINGE | INTRAMUSCULAR | Status: DC
  Administered 2021-10-25 – 2021-10-26 (×2): 40 mg via SUBCUTANEOUS
  Filled 2021-10-25 (×2): qty 0.4

## 2021-10-25 MED ORDER — MORPHINE SULFATE (PF) 2 MG/ML IV SOLN
2.0000 mg | INTRAVENOUS | Status: DC | PRN
  Administered 2021-10-25 – 2021-10-26 (×2): 2 mg via INTRAVENOUS
  Filled 2021-10-25 (×2): qty 1

## 2021-10-25 MED ORDER — ALUM & MAG HYDROXIDE-SIMETH 200-200-20 MG/5ML PO SUSP
30.0000 mL | Freq: Once | ORAL | Status: AC
  Administered 2021-10-25: 30 mL via ORAL
  Filled 2021-10-25: qty 30

## 2021-10-25 MED ORDER — PANTOPRAZOLE SODIUM 40 MG IV SOLR
40.0000 mg | Freq: Two times a day (BID) | INTRAVENOUS | Status: DC
  Administered 2021-10-25: 40 mg via INTRAVENOUS
  Filled 2021-10-25: qty 10

## 2021-10-25 NOTE — ED Provider Notes (Cosign Needed Addendum)
[facility] EMERGENCY DEPARTMENT Provider Note   CSN: [id number] Arrival date & time: 10/24/21  2116     History  Chief Complaint  Patient presents with   Rectal Bleeding    Kyle Berry is a 51 y.o. male with an extensive Gi hx. [facility]. Hx of CHronic pancreatitis, GI bleed, single kidney, pancreatic divisum, GERD. Pt c/o abdominal pain, nausea vomiting, hematochezia and hematemesis.  Patient states that hematemesis and hematochezia are not infrequent when he has episodes of vomiting.  He has been having this for about 1 to 2 months.  He is followed at [facility] and spoke with his GI specialist who asked him to be seen in the emergency department today.  Patient has been unable to hold down any fluids.  He took antiemetics at home without relief.  He had increased hematemesis today which brought him here.  Abdominal pain is generalized.  Rectal Bleeding      Home Medications Prior to Admission medications   Medication Sig Start Date End Date Taking? Authorizing Provider  acetaminophen (TYLENOL) 500 MG tablet Take 500 mg by mouth 2 (two) times daily as needed (pain).    [provider]  famotidine (PEPCID) 20 MG tablet Take 1 tablet (20 mg total) by mouth 2 (two) times daily. 09/26/21 03/25/22  Barb Merino, MD  feeding supplement (ENSURE ENLIVE / ENSURE PLUS) LIQD Take 237 mLs by mouth 2 (two) times daily between meals. Patient taking differently: Take 237 mLs by mouth See admin instructions. Drink 237 ml by mouth 2-3 times a day between meals 02/04/21   Dahal, Marlowe Aschoff, MD  lipase/protease/amylase (CREON) 36000 UNITS CPEP capsule Take 2 capsules (72,000 Units total) by mouth 3 (three) times daily with meals. Patient not taking: Reported on 09/19/2021 08/12/21   Sanjuan Dame, MD  Naphazoline-Pheniramine (VISINE-A OP) Place 2 drops into both eyes daily.    [provider]  ondansetron (ZOFRAN-ODT) 4 MG disintegrating tablet Take 1 tablet  (4 mg total) by mouth every 8 (eight) hours as needed for nausea or vomiting. Patient taking differently: Take 4 mg by mouth 2 (two) times daily as needed for nausea or vomiting. 09/07/21   Petrucelli, Samantha R, PA-C  telmisartan (MICARDIS) 40 MG tablet Take 1 tablet (40 mg total) by mouth daily. 04/26/21 05/26/21  Idamae Schuller, MD      Allergies    Diclofenac, Norvasc [amlodipine besylate], Omeprazole, Hydrochlorothiazide, and Prednisone    Review of Systems   Review of Systems  Gastrointestinal:  Positive for hematochezia.    Physical Exam Updated Vital Signs BP (!) 141/95   Pulse 73   Temp 98.7 F (37.1 C) (Oral)   Resp 16   Ht '6\' 1"'$  (1.854 m)   Wt 73 kg   SpO2 100%   BMI 21.24 kg/m  Physical Exam Vitals and nursing note reviewed.  Constitutional:      General: He is not in acute distress.    Appearance: He is well-developed. He is not diaphoretic.  HENT:     Head: Normocephalic and atraumatic.     Mouth/Throat:     Mouth: Mucous membranes are dry.  Eyes:     General: No scleral icterus.    Conjunctiva/sclera: Conjunctivae normal.  Cardiovascular:     Rate and Rhythm: Normal rate and regular rhythm.     Heart sounds: Normal heart sounds.  Pulmonary:     Effort: Pulmonary effort is normal. No respiratory distress.     Breath sounds: Normal  breath sounds.  Abdominal:     Palpations: Abdomen is soft.     Tenderness: There is abdominal tenderness. There is no guarding.  Musculoskeletal:     Cervical back: Normal range of motion and neck supple.  Skin:    General: Skin is warm and dry.  Neurological:     Mental Status: He is alert.  Psychiatric:        Behavior: Behavior normal.     ED Results / Procedures / Treatments   Labs (all labs ordered are listed, but only abnormal results are displayed) Labs Reviewed  CBC WITH DIFFERENTIAL/PLATELET - Abnormal; Notable for the following components:      Result Value   RBC 3.80 (*)    MCV 106.6 (*)    MCH 34.7 (*)     RDW 15.9 (*)    All other components within normal limits  COMPREHENSIVE METABOLIC PANEL - Abnormal; Notable for the following components:   Potassium 3.3 (*)    CO2 19 (*)    Glucose, Bld 126 (*)    AST 49 (*)    All other components within normal limits  LIPASE, BLOOD  POC OCCULT BLOOD, ED  TYPE AND SCREEN    EKG None  Radiology CT ABDOMEN PELVIS W CONTRAST  Result Date: 10/25/2021 CLINICAL DATA:  Epigastric pain EXAM: CT ABDOMEN AND PELVIS WITH CONTRAST TECHNIQUE: Multidetector CT imaging of the abdomen and pelvis was performed using the standard protocol following bolus administration of intravenous contrast. RADIATION DOSE REDUCTION: This exam was performed according to the departmental dose-optimization program which includes automated exposure control, adjustment of the mA and/or kV according to patient size and/or use of iterative reconstruction technique. CONTRAST:  143m OMNIPAQUE IOHEXOL 300 MG/ML  SOLN COMPARISON:  09/19/2021 FINDINGS: Lower chest: No acute abnormality Hepatobiliary: No focal liver abnormality is seen. Status post cholecystectomy. No biliary dilatation. Mild diffuse low-density throughout the liver compatible with fatty infiltration. Pancreas: Calcification within the pancreatic head again noted, likely related to chronic pancreatitis. No evidence of acute pancreatitis or ductal dilatation. Spleen: No focal abnormality.  Normal size. Adrenals/Urinary Tract: Adrenal glands and left kidney unremarkable. Right kidney is severely atrophic. No hydronephrosis. Urinary bladder unremarkable. Stomach/Bowel: Normal appendix. Stomach, large and small bowel grossly unremarkable. Vascular/Lymphatic: No evidence of aneurysm or adenopathy. Reproductive: No visible focal abnormality. Other: No free fluid or free air. Musculoskeletal: No acute bony abnormality. IMPRESSION: Hepatic steatosis. No evidence of acute pancreatitis. No acute findings. Electronically Signed   By: KRolm BaptiseM.D.   On: 10/25/2021 00:20    Procedures Procedures    Medications Ordered in ED Medications  iohexol (OMNIPAQUE) 300 MG/ML solution 100 mL (100 mLs Intravenous Contrast Given 10/25/21 0007)    ED Course/ Medical Decision Making/ A&P Clinical Course as of 10/25/21 1514  Wed Oct 25, 2021  1211 Patient continuing to have nausea and pain.  Meds reordered.  Will attempt p.o. challenge [AH]  1449 Comprehensive metabolic panel(!) [AH]  10254Potassium(!): 3.3 [AH]  1449 Glucose(!): 126 [AH]  1449 CO2(!): 19 CO2 mildly low likely due to chloride lites from vomiting [AH]  1449 CBC with Differential(!) CBC without leukocytosis, hemoglobin level normal [AH]  1450 Lipase, blood Lipase within normal limits [AH]  1450 CT ABDOMEN PELVIS W CONTRAST CT abdomen pelvis shows no evidence of acute pancreatitis or other significant finding.  I personally reviewed images ordered at triage and interpreted film. [AH]  1450 Patient given Reglan, Zofran.  I have ordered Phenergan however this  has not yet been given.  I attempted p.o. challenge however patient was unable to hold this down and has continued to vomit. [AH]    Clinical Course User Index [AH] Margarita Mail, PA-C                           Medical Decision Making 51 year old male who presents emergency department with chief complaint of vomiting. The emergent differential diagnosis for vomiting includes, but is not limited to ACS/MI, DKA, Ischemic bowel, Meningitis, Sepsis, Acute gastric dilation, Adrenal insufficiency, Appendicitis,  Bowel obstruction/ileus, Carbon monoxide poisoning, Cholecystitis, Electrolyte abnormalities, Elevated ICP, Gastric outlet obstruction, Pancreatitis, Ruptured viscus, Biliary colic, Cannabinoid hyperemesis syndrome, Gastritis, Gastroenteritis, Gastroparesis,  Narcotic withdrawal, Peptic ulcer disease, and UTI After review of all data points the patient does not appear to have acute pancreatitis.  His labs and  imaging are all reassuring.  He does not appear to have significant GI hemorrhage.  Unfortunately after multiple antiemetics patient continues to vomit.  Case discussed with hospitalist service who will admit the patient for intractable vomiting  Amount and/or Complexity of Data Reviewed Labs: ordered. Decision-making details documented in ED Course.    Details: I ordered and interpreted patient's labs, his hemoglobin is within normal limits, no elevated lipase Radiology: independent interpretation performed. Decision-making details documented in ED Course.    Details: I reviewed patient's imaging.  I interpreted CT imaging of the abdomen and pelvis which shows no acute finding Discussion of management or test interpretation with external provider(s): Discussed case with the hospitalist service for intractable vomiting who will admit the patient  Patient given multiple rounds of antiemetics, fluid and pain meds and continues to have vomiting despite intervention.  Risk OTC drugs. Prescription drug management. Decision regarding hospitalization.           Final Clinical Impression(s) / ED Diagnoses Final diagnoses:  Intractable vomiting    Rx / DC Orders ED Discharge Orders     None         Margarita Mail, PA-C 10/25/21 [place], [place], PA-C 10/25/21 1553    Tegeler, Gwenyth Allegra, MD 10/26/21 [phone]

## 2021-10-25 NOTE — H&P (Addendum)
History and Physical    Patient: Kyle Berry DOB: 12-07-1970 DOA: 10/24/2021 DOS: the patient was seen and examined on 10/25/2021 PCP: [place]  Patient coming from: Home  Chief Complaint:  Chief Complaint  Patient presents with   Rectal Bleeding   HPI: Kyle Berry is a 51 y.o. male with medical history significant of pancreatic divisum (previously had a papillotomy and stent placed which was subsequently removed due to recurrent pain complaints), recurrent episodes of pancreatitis (with frequent hospital visits and multiple CT Scans), who is s/p 2019 cholecystectomy (follows with [facility] gastroenterologist Dr. Cleda Mccreedy), and has a congenital solitary kidney who presents to the emergency department with nausea, non-bloody non-bilious vomiting, and prolonged episodes of dull intermittent upper abdominal pain that has been ongoing for one week.  He reports feeling weak and tired.  He denies any excess weight loss.  Lipase is normal at 41 U/L.  CT of the abdomen shows no evidence of acute pancreatitis or other acute abnormality.  Kyle Berry also endorses greasy foul smelling diarrhea for which he is prescribed Creon.  He states he is not taking this medication due to insurance issues.  He denies use of alcohol, cigarettes, or recreational drugs.  He denies any fevers, chills, chest pain, shortness of breath, hematemesis, melena, hematochezia, or urinary symptoms.  Kyle Berry has had multiple CT Scans in the past that show no evidence of malignancy or ductal obstruction.  He does state his grandmother passed away from pancreatic cancer.  In 2020, his IGG panel was negative.    Review of Systems: As mentioned in the history of present illness. All other systems reviewed and are negative. Past Medical History:  Diagnosis Date   Atrophy of right kidney    Chronic bronchitis (HCC)    Chronic lower back pain    Chronic pancreatitis ([place])     Gastroparesis 06/2020   confirmed by emptying study at [facility]   GERD (gastroesophageal reflux disease)    Headache    "once/month" (05/13/2017)   High cholesterol    Hypertension    Migraine    "a couple/year" (05/13/2017)   Nephrolithiasis 05/13/2017   Pancreatitis    Pneumonia ~ 09/2015   Presence of pancreatic duct stent    Recurrent acute pancreatitis    Shortness of breath 05/22/2021   Past Surgical History:  Procedure Laterality Date   BIOPSY  11/02/2020   Procedure: BIOPSY;  Surgeon: Irving Copas., MD;  Location: [place];  Service: Gastroenterology;;   CHOLECYSTECTOMY N/A 10/23/2017   Procedure: LAPAROSCOPIC CHOLECYSTECTOMY WITH INTRAOPERATIVE CHOLANGIOGRAM;  Surgeon: Johnathan Hausen, MD;  [facility];  Service: General;  Laterality: N/A;   COLONOSCOPY WITH PROPOFOL N/A 11/02/2019   Procedure: COLONOSCOPY WITH PROPOFOL;  Surgeon: Irene Shipper, MD;  Location: [facility] ENDOSCOPY;  Service: Endoscopy;  Laterality: N/A;   ERCP N/A 11/02/2020   Procedure: ENDOSCOPIC RETROGRADE CHOLANGIOPANCREATOGRAPHY (ERCP);  Surgeon: Irving Copas., MD;  Location: [place];  Service: Gastroenterology;  Laterality: N/A;   ESOPHAGOGASTRODUODENOSCOPY N/A 11/02/2020   Procedure: ESOPHAGOGASTRODUODENOSCOPY (EGD);  Surgeon: Irving Copas., MD;  Location: [place];  Service: Gastroenterology;  Laterality: N/A;   ESOPHAGOGASTRODUODENOSCOPY (EGD) WITH PROPOFOL N/A 11/06/2019   Procedure: ESOPHAGOGASTRODUODENOSCOPY (EGD) WITH PROPOFOL;  Surgeon: Irene Shipper, MD;  Location: [facility] ENDOSCOPY;  Service: Endoscopy;  Laterality: N/A;   HEMOSTASIS CLIP PLACEMENT  11/02/2019   Procedure: HEMOSTASIS CLIP PLACEMENT;  Surgeon: Irene Shipper, MD;  Location: [place];  Service:  Endoscopy;;   NO PAST SURGERIES     REMOVAL OF STONES  11/02/2020   Procedure: REMOVAL OF STONES;  Surgeon: Rush Landmark Telford Nab., MD;  Location: [place];  Service: Gastroenterology;;   Lavell Islam REMOVAL   11/02/2020   Procedure: STENT REMOVAL;  Surgeon: Irving Copas., MD;  Location: [place];  Service: Gastroenterology;;   Social History:  reports that he has never smoked. He has never used smokeless tobacco. He reports that he does not currently use alcohol. He reports that he does not use drugs.  Allergies  Allergen Reactions   Diclofenac Hives and Other (See Comments)    Fluid buildup in chest   Norvasc [Amlodipine Besylate] Other (See Comments)    Fluid buildup in chest   Omeprazole Other (See Comments)    MD stopped due to pancreatitis   Hydrochlorothiazide     07/31/2016 -  07/20/2016 -  05/28/2016 -   Other Reaction(s) from [place]: itching   Prednisone Hives and Other (See Comments)    Mood swings    Family History  Problem Relation Age of Onset   Hypertension Other    Hypertension Mother    Hypertension Father    Kidney disease Father    Hypertension Sister    Diabetes Sister    Hypertension Brother    Pancreatic cancer Paternal Grandmother    Colon cancer Cousin    Stomach cancer Neg Hx    Esophageal cancer Neg Hx     Prior to Admission medications   Medication Sig Start Date End Date Taking? Authorizing Provider  acetaminophen (TYLENOL) 500 MG tablet Take 500 mg by mouth 2 (two) times daily as needed (pain).    [provider]  famotidine (PEPCID) 20 MG tablet Take 1 tablet (20 mg total) by mouth 2 (two) times daily. 09/26/21 03/25/22  Barb Merino, MD  feeding supplement (ENSURE ENLIVE / ENSURE PLUS) LIQD Take 237 mLs by mouth 2 (two) times daily between meals. Patient taking differently: Take 237 mLs by mouth See admin instructions. Drink 237 ml by mouth 2-3 times a day between meals 02/04/21   Dahal, Marlowe Aschoff, MD  lipase/protease/amylase (CREON) 36000 UNITS CPEP capsule Take 2 capsules (72,000 Units total) by mouth 3 (three) times daily with meals. Patient not taking: Reported on 09/19/2021 08/12/21   Sanjuan Dame, MD   Naphazoline-Pheniramine (VISINE-A OP) Place 2 drops into both eyes daily.    [provider]  ondansetron (ZOFRAN-ODT) 4 MG disintegrating tablet Take 1 tablet (4 mg total) by mouth every 8 (eight) hours as needed for nausea or vomiting. Patient taking differently: Take 4 mg by mouth 2 (two) times daily as needed for nausea or vomiting. 09/07/21   Petrucelli, Samantha R, PA-C  telmisartan (MICARDIS) 40 MG tablet Take 1 tablet (40 mg total) by mouth daily. 04/26/21 05/26/21  Idamae Schuller, MD    Physical Exam: Vitals:   10/25/21 1145 10/25/21 1230 10/25/21 1415 10/25/21 1430  BP: (!) 150/99 (!) 158/101 (!) 147/99 (!) 147/101  Pulse: 66 75 65 78  Resp: '18 17  16  '$ Temp:      TempSrc:      SpO2: 100% 97% 97% 97%  Weight:      Height:       Examination: General exam: chronically ill appearing, fatigued, nauseous HEENT: NCAT, PERRL Respiratory system: CTAB no WRR Cardiovascular system: Did not appreciate a murmur, regular, No JVD. Gastrointestinal system: Mild upper abdominal tenderness to palpation, no peritoneal signs, BS+. Nervous System: No focal  deficits. Extremities: No edema, distal peripheral pulses palpable.  Skin: No rashes, No bruises, No icterus. Psych: Mood is stable.  Data Reviewed: CT ABDOMEN PELVIS W CONTRAST CLINICAL DATA:  Epigastric pain  EXAM: CT ABDOMEN AND PELVIS WITH CONTRAST  TECHNIQUE: Multidetector CT imaging of the abdomen and pelvis was performed using the standard protocol following bolus administration of intravenous contrast.  RADIATION DOSE REDUCTION: This exam was performed according to the departmental dose-optimization program which includes automated exposure control, adjustment of the mA and/or kV according to patient size and/or use of iterative reconstruction technique.  CONTRAST:  142m OMNIPAQUE IOHEXOL 300 MG/ML  SOLN  COMPARISON:  09/19/2021  FINDINGS: Lower chest: No acute abnormality  Hepatobiliary: No focal liver  abnormality is seen. Status post cholecystectomy. No biliary dilatation. Mild diffuse low-density throughout the liver compatible with fatty infiltration.  Pancreas: Calcification within the pancreatic head again noted, likely related to chronic pancreatitis. No evidence of acute pancreatitis or ductal dilatation.  Spleen: No focal abnormality.  Normal size.  Adrenals/Urinary Tract: Adrenal glands and left kidney unremarkable. Right kidney is severely atrophic. No hydronephrosis. Urinary bladder unremarkable.  Stomach/Bowel: Normal appendix. Stomach, large and small bowel grossly unremarkable.  Vascular/Lymphatic: No evidence of aneurysm or adenopathy.  Reproductive: No visible focal abnormality.  Other: No free fluid or free air.  Musculoskeletal: No acute bony abnormality.  IMPRESSION: Hepatic steatosis.  No evidence of acute pancreatitis.  No acute findings.  Electronically Signed   By: KRolm BaptiseM.D.   On: 10/25/2021 00:20    Assessment and Plan: Intractable Nausea and Vomiting: - Schedule IV Zofran 4 mg q6hs.  Give IV Phenergan PRN for refractory nausea and vomiting. - Start LR 100 CC/hr. - Start IV Protonix 40 mg daily. - Keep on Clear Liquid Diet.  Chronic Pancreatitis / 2019 Cholecystectomy / Congenital Pancreatic Divisum s/p ERCP with major minor papillotomy and stent placement and removal: Lipase is 41 U/L.  AST/ALT is 49/25 U/L and Total Bilirubin is 1.1 mg/dL.  CT of the abdomen shows no inflammatory stranding or pseudocysts of the pancreas.  I do not see any biliary dilation or choledocholithiasis.  There is mild wall thickening at the upper duodenum. - The assumption is that pancreatic divisum predisposed him to recurrent chronic pancreatitis, however papillotomy and stent placement did not relieve his symptoms.  Regardless, over time, chronic pancreatitis can cause atrophy and fibrosis that can result in functional abdominal pain, pancreatogenic  diabetes, and malabsorption/steatorrhea.   - Keep on liquid diet, continue fluids, and give pain meds as needed. - Check A1C. - Discuss with social worker in the am if there is any way we can help patient with obtaining Creon pancreatic enzymes. - Avoid alcohol and maintain low fat diet.  GERD:  - Protonix 40 mg daily.  Mild Hypokalemia, - K 3.3 mmol/L, replaced.  Milk AKI, pre-renal: - Creatinine is 1.16 mg/dL with baseline ~ 0.9 mg/dL. - Give fluids and monitor.  Congenital Solitary Kidney Hepatic Steatosis Moderate Protein Calorie Malnutrition:  - Recommend nutritional supplements.   Advance Care Planning:   Code Status: Full Code   Consults: None  Family Communication: None present. I spoke with patient at bedside.  Severity of Illness: The appropriate patient status for this patient is OBSERVATION. Observation status is judged to be reasonable and necessary in order to provide the required intensity of service to ensure the patient's safety. The patient's presenting symptoms, physical exam findings, and initial radiographic and laboratory data in the context of their  medical condition is felt to place them at decreased risk for further clinical deterioration. Furthermore, it is anticipated that the patient will be medically stable for discharge from the hospital within 2 midnights of admission.   Author: George Hugh, MD 10/25/2021 3:58 PM  For on call review [email or web address].

## 2021-10-25 NOTE — Progress Notes (Signed)
Patient arrived to room. Vital signs taken. Call bell within reach and patient resting comfortably.

## 2021-10-25 NOTE — ED Notes (Signed)
ED TO INPATIENT HANDOFF REPORT  ED Nurse Name and Phone #: Jeannie Done [phone]  S Name/Age/Gender Kyle Berry 51 y.o. male Room/Bed: 035C/035C  Code Status   Code Status: Full Code  Home/SNF/Other Home Patient oriented to: self, place, time, and situation Is this baseline? Yes   Triage Complete: Triage complete  Chief Complaint Nausea and vomiting [R11.2]  Triage Note Pt reports blood in his stools as well as vomiting up bright red blood, onset last night but states he has been having these issues for about 1-2 months.  He also reports abd pain   Allergies Allergies  Allergen Reactions   Diclofenac Hives and Other (See Comments)    Fluid buildup in chest   Norvasc [Amlodipine Besylate] Other (See Comments)    Fluid buildup in chest   Omeprazole Other (See Comments)    MD stopped due to pancreatitis   Hydrochlorothiazide     07/31/2016 -  07/20/2016 -  05/28/2016 -   Other Reaction(s) from [place]: itching   Prednisone Hives and Other (See Comments)    Mood swings    Level of Care/Admitting Diagnosis ED Disposition     ED Disposition  Admit   Condition  --   [place]: [place] [[id number]]  Level of Care: Med-Surg [16]  May place patient in observation at [facility] or [place] if equivalent level of care is available:: Yes  Covid Evaluation: Asymptomatic - no recent exposure (last 10 days) testing not required  Diagnosis: Nausea and vomiting [784696]  Admitting Physician: George Hugh [[id number]]  Attending Physician: George Hugh [[id number]]          B Medical/Surgery History Past Medical History:  Diagnosis Date   Atrophy of right kidney    Chronic bronchitis ([place])    Chronic lower back pain    Chronic pancreatitis ([place])    Gastroparesis 06/2020   confirmed by emptying study at [facility]   GERD (gastroesophageal reflux disease)    Headache    "once/month" (05/13/2017)   High cholesterol    Hypertension     Migraine    "a couple/year" (05/13/2017)   Nephrolithiasis 05/13/2017   Pancreatitis    Pneumonia ~ 09/2015   Presence of pancreatic duct stent    Recurrent acute pancreatitis    Shortness of breath 05/22/2021   Past Surgical History:  Procedure Laterality Date   BIOPSY  11/02/2020   Procedure: BIOPSY;  Surgeon: Irving Copas., MD;  Location: [place];  Service: Gastroenterology;;   CHOLECYSTECTOMY N/A 10/23/2017   Procedure: LAPAROSCOPIC CHOLECYSTECTOMY WITH INTRAOPERATIVE CHOLANGIOGRAM;  Surgeon: Johnathan Hausen, MD;  [facility];  Service: General;  Laterality: N/A;   COLONOSCOPY WITH PROPOFOL N/A 11/02/2019   Procedure: COLONOSCOPY WITH PROPOFOL;  Surgeon: Irene Shipper, MD;  Location: [place];  Service: Endoscopy;  Laterality: N/A;   ERCP N/A 11/02/2020   Procedure: ENDOSCOPIC RETROGRADE CHOLANGIOPANCREATOGRAPHY (ERCP);  Surgeon: Irving Copas., MD;  Location: [place];  Service: Gastroenterology;  Laterality: N/A;   ESOPHAGOGASTRODUODENOSCOPY N/A 11/02/2020   Procedure: ESOPHAGOGASTRODUODENOSCOPY (EGD);  Surgeon: Irving Copas., MD;  Location: [place];  Service: Gastroenterology;  Laterality: N/A;   ESOPHAGOGASTRODUODENOSCOPY (EGD) WITH PROPOFOL N/A 11/06/2019   Procedure: ESOPHAGOGASTRODUODENOSCOPY (EGD) WITH PROPOFOL;  Surgeon: Irene Shipper, MD;  Location: [facility] ENDOSCOPY;  Service: Endoscopy;  Laterality: N/A;   HEMOSTASIS CLIP PLACEMENT  11/02/2019   Procedure: HEMOSTASIS CLIP PLACEMENT;  Surgeon: Irene Shipper, MD;  Location: [facility] ENDOSCOPY;  Service: Endoscopy;;  NO PAST SURGERIES     REMOVAL OF STONES  11/02/2020   Procedure: REMOVAL OF STONES;  Surgeon: Irving Copas., MD;  Location: [place];  Service: Gastroenterology;;   Lavell Islam REMOVAL  11/02/2020   Procedure: STENT REMOVAL;  Surgeon: Irving Copas., MD;  Location: [place];  Service: Gastroenterology;;     A IV Location/Drains/Wounds Patient  Lines/Drains/Airways Status     Active Line/Drains/Airways     Name Placement date Placement time Site Days   Peripheral IV 10/24/21 Left;Posterior Forearm 10/24/21  2352  Forearm  1            Intake/Output Last 24 hours  Intake/Output Summary (Last 24 hours) at 10/25/2021 1744 Last data filed at 10/25/2021 1544 Gross per 24 hour  Intake 31.16 ml  Output --  Net 31.16 ml    Labs/Imaging Results for orders placed or performed during the hospital encounter of 10/24/21 (from the past 48 hour(s))  CBC with Differential     Status: Abnormal   Collection Time: 10/24/21 10:20 PM  Result Value Ref Range   WBC 7.3 4.0 - 10.5 K/uL   RBC 3.80 (L) 4.22 - 5.81 MIL/uL   Hemoglobin 13.2 13.0 - 17.0 g/dL   HCT 40.5 39.0 - 52.0 %   MCV 106.6 (H) 80.0 - 100.0 fL   MCH 34.7 (H) 26.0 - 34.0 pg   MCHC 32.6 30.0 - 36.0 g/dL   RDW 15.9 (H) 11.5 - 15.5 %   Platelets 165 150 - 400 K/uL   nRBC 0.0 0.0 - 0.2 %   Neutrophils Relative % 59 %   Neutro Abs 4.4 1.7 - 7.7 K/uL   Lymphocytes Relative 31 %   Lymphs Abs 2.2 0.7 - 4.0 K/uL   Monocytes Relative 9 %   Monocytes Absolute 0.6 0.1 - 1.0 K/uL   Eosinophils Relative 1 %   Eosinophils Absolute 0.0 0.0 - 0.5 K/uL   Basophils Relative 0 %   Basophils Absolute 0.0 0.0 - 0.1 K/uL   Immature Granulocytes 0 %   Abs Immature Granulocytes 0.01 0.00 - 0.07 K/uL    Comment: Performed at [facility], [address] [place], [place] [address]  Comprehensive metabolic panel     Status: Abnormal   Collection Time: 10/24/21 10:20 PM  Result Value Ref Range   Sodium 137 135 - 145 mmol/L   Potassium 3.3 (L) 3.5 - 5.1 mmol/L   Chloride 105 98 - 111 mmol/L   CO2 19 (L) 22 - 32 mmol/L   Glucose, Bld 126 (H) 70 - 99 mg/dL    Comment: Glucose reference range applies only to samples taken after fasting for at least 8 hours.   BUN 9 6 - 20 mg/dL   Creatinine, Ser 1.16 0.61 - 1.24 mg/dL   Calcium 9.0 8.9 - 10.3 mg/dL   Total Protein 7.1 6.5 - 8.1  g/dL   Albumin 3.7 3.5 - 5.0 g/dL   AST 49 (H) 15 - 41 U/L   ALT 25 0 - 44 U/L   Alkaline Phosphatase 64 38 - 126 U/L   Total Bilirubin 1.1 0.3 - 1.2 mg/dL   GFR, Estimated >60 >60 mL/min    Comment: (NOTE) Calculated using the CKD-EPI Creatinine Equation (2021)    Anion gap 13 5 - 15    Comment: Performed at [facility] [address] [place], [place] [address]  Lipase, blood     Status: None   Collection Time: 10/24/21  10:20 PM  Result Value Ref Range   Lipase 41 11 - 51 U/L    Comment: Performed at [facility] [address], [place], [place] [address]  Type and screen     Status: None   Collection Time: 10/24/21 10:20 PM  Result Value Ref Range   ABO/RH(D) O POS    Antibody Screen NEG    Sample Expiration      10/27/2021,2359 Performed at [facility], [place] [address] [place], [place] [address]   Hemoglobin A1c     Status: None   Collection Time: 10/25/21  4:19 PM  Result Value Ref Range   Hgb A1c MFr Bld 5.3 4.8 - 5.6 %    Comment: (NOTE) Pre diabetes:          5.7%-6.4%  Diabetes:              >6.4%  Glycemic control for   <7.0% adults with diabetes    Mean Plasma Glucose 105.41 mg/dL    Comment: Performed at [facility] [address], [place], [place] [address]  C-reactive protein     Status: None   Collection Time: 10/25/21  4:19 PM  Result Value Ref Range   CRP <0.5 <1.0 mg/dL    Comment: Performed at [facility], [place] [address] [place], [place] [address]  Lactic acid, plasma     Status: None   Collection Time: 10/25/21  4:19 PM  Result Value Ref Range   Lactic Acid, Venous 0.6 0.5 - 1.9 mmol/L    Comment: Performed at [facility] [address] [place], [place] [address]  CK     Status: None   Collection Time: 10/25/21  4:19 PM  Result Value Ref Range   Total CK 83 49 - 397 U/L    Comment: Performed at [facility], [place], [place] [address] [place], [place] [address]   CT ABDOMEN PELVIS W  CONTRAST  Result Date: 10/25/2021 CLINICAL DATA:  Epigastric pain EXAM: CT ABDOMEN AND PELVIS WITH CONTRAST TECHNIQUE: Multidetector CT imaging of the abdomen and pelvis was performed using the standard protocol following bolus administration of intravenous contrast. RADIATION DOSE REDUCTION: This exam was performed according to the departmental dose-optimization program which includes automated exposure control, adjustment of the mA and/or kV according to patient size and/or use of iterative reconstruction technique. CONTRAST:  147m OMNIPAQUE IOHEXOL 300 MG/ML  SOLN COMPARISON:  09/19/2021 FINDINGS: Lower chest: No acute abnormality Hepatobiliary: No focal liver abnormality is seen. Status post cholecystectomy. No biliary dilatation. Mild diffuse low-density throughout the liver compatible with fatty infiltration. Pancreas: Calcification within the pancreatic head again noted, likely related to chronic pancreatitis. No evidence of acute pancreatitis or ductal dilatation. Spleen: No focal abnormality.  Normal size. Adrenals/Urinary Tract: Adrenal glands and left kidney unremarkable. Right kidney is severely atrophic. No hydronephrosis. Urinary bladder unremarkable. Stomach/Bowel: Normal appendix. Stomach, large and small bowel grossly unremarkable. Vascular/Lymphatic: No evidence of aneurysm or adenopathy. Reproductive: No visible focal abnormality. Other: No free fluid or free air. Musculoskeletal: No acute bony abnormality. IMPRESSION: Hepatic steatosis. No evidence of acute pancreatitis. No acute findings. Electronically Signed   By: KRolm BaptiseM.D.   On: 10/25/2021 00:20    Pending Labs Unresulted Labs (From admission, onward)     Start     Ordered   10/26/21 00272 Basic metabolic panel  Tomorrow morning,   R        10/25/21  1558   10/26/21 0500  CBC  Tomorrow morning,   R        10/25/21 1558   10/25/21 1618  Sedimentation rate  Once,   R        10/25/21 1617   10/25/21 1555  Rapid urine drug  screen (hospital performed)  ONCE - STAT,   STAT        10/25/21 1555            Vitals/Pain Today's Vitals   10/25/21 1535 10/25/21 1602 10/25/21 1728 10/25/21 1728  BP:    (!) 149/93  Pulse:    (!) 57  Resp:    18  Temp:    98.5 F (36.9 C)  TempSrc:      SpO2:    98%  Weight:      Height:      PainSc: '9  8  6      '$ Isolation Precautions No active isolations  Medications Medications  promethazine (PHENERGAN) 12.5 mg in sodium chloride 0.9 % 50 mL IVPB (0 mg Intravenous Stopped 10/25/21 1544)  lactated ringers infusion ( Intravenous New Bag/Given 10/25/21 1617)  pantoprazole (PROTONIX) injection 40 mg (has no administration in time range)  ondansetron (ZOFRAN) injection 4 mg (4 mg Intravenous Given 10/25/21 1727)  promethazine (PHENERGAN) 6.25 mg in sodium chloride 0.9 % 50 mL IVPB (has no administration in time range)  morphine (PF) 2 MG/ML injection 2 mg (has no administration in time range)  enoxaparin (LOVENOX) injection 40 mg (has no administration in time range)  acetaminophen (TYLENOL) tablet 650 mg (has no administration in time range)    Or  acetaminophen (TYLENOL) suppository 650 mg (has no administration in time range)  HYDROcodone-acetaminophen (NORCO/VICODIN) 5-325 MG per tablet 1-2 tablet (has no administration in time range)  senna-docusate (Senokot-S) tablet 1 tablet (has no administration in time range)  albuterol (PROVENTIL) (2.5 MG/3ML) 0.083% nebulizer solution 2.5 mg (has no administration in time range)  iohexol (OMNIPAQUE) 300 MG/ML solution 100 mL (100 mLs Intravenous Contrast Given 10/25/21 0007)  sodium chloride 0.9 % bolus 1,000 mL (0 mLs Intravenous Stopped 10/25/21 1439)  fentaNYL (SUBLIMAZE) injection 50 mcg (50 mcg Intravenous Given 10/25/21 0856)  ondansetron (ZOFRAN) injection 4 mg (4 mg Intravenous Given 10/25/21 0855)  famotidine (PEPCID) IVPB 20 mg premix (0 mg Intravenous Stopped 10/25/21 0951)  metoCLOPramide (REGLAN) injection 5 mg (5 mg  Intravenous Given 10/25/21 1036)  HYDROmorphone (DILAUDID) injection 0.5 mg (0.5 mg Intravenous Given 10/25/21 1240)  alum & mag hydroxide-simeth (MAALOX/MYLANTA) 200-200-20 MG/5ML suspension 30 mL (30 mLs Oral Given 10/25/21 1615)  fentaNYL (SUBLIMAZE) injection 50 mcg (50 mcg Intravenous Given 10/25/21 1601)    Mobility walks Low fall risk   Focused Assessments    R Recommendations: See Admitting Provider Note  Report given to:   Additional Notes:

## 2021-10-26 ENCOUNTER — Other Ambulatory Visit (HOSPITAL_COMMUNITY): Payer: Self-pay

## 2021-10-26 ENCOUNTER — Telehealth (HOSPITAL_COMMUNITY): Payer: Self-pay | Admitting: Pharmacy Technician

## 2021-10-26 DIAGNOSIS — R1110 Vomiting, unspecified: Secondary | ICD-10-CM | POA: Diagnosis not present

## 2021-10-26 LAB — CBC
HCT: 35.4 % — ABNORMAL LOW (ref 39.0–52.0)
Hemoglobin: 12.2 g/dL — ABNORMAL LOW (ref 13.0–17.0)
MCH: 34.9 pg — ABNORMAL HIGH (ref 26.0–34.0)
MCHC: 34.5 g/dL (ref 30.0–36.0)
MCV: 101.1 fL — ABNORMAL HIGH (ref 80.0–100.0)
Platelets: 168 10*3/uL (ref 150–400)
RBC: 3.5 MIL/uL — ABNORMAL LOW (ref 4.22–5.81)
RDW: 15.4 % (ref 11.5–15.5)
WBC: 5.1 10*3/uL (ref 4.0–10.5)
nRBC: 0 % (ref 0.0–0.2)

## 2021-10-26 LAB — BASIC METABOLIC PANEL
Anion gap: 6 (ref 5–15)
BUN: <5 mg/dL — ABNORMAL LOW (ref 6–20)
CO2: 26 mmol/L (ref 22–32)
Calcium: 8.4 mg/dL — ABNORMAL LOW (ref 8.9–10.3)
Chloride: 106 mmol/L (ref 98–111)
Creatinine, Ser: 1.16 mg/dL (ref 0.61–1.24)
GFR, Estimated: >60 mL/min (ref >60)
Glucose, Bld: 113 mg/dL — ABNORMAL HIGH (ref 70–99)
Potassium: 3.3 mmol/L — ABNORMAL LOW (ref 3.5–5.1)
Sodium: 138 mmol/L (ref 135–145)

## 2021-10-26 MED ORDER — OXYCODONE HCL 5 MG PO TABS
5.0000 mg | ORAL_TABLET | Freq: Four times a day (QID) | ORAL | Status: DC | PRN
  Administered 2021-10-26 – 2021-10-27 (×3): 5 mg via ORAL
  Filled 2021-10-26 (×3): qty 1

## 2021-10-26 MED ORDER — POTASSIUM CHLORIDE CRYS ER 20 MEQ PO TBCR
40.0000 meq | EXTENDED_RELEASE_TABLET | ORAL | Status: AC
  Administered 2021-10-26 (×2): 40 meq via ORAL
  Filled 2021-10-26 (×2): qty 2

## 2021-10-26 MED ORDER — METOCLOPRAMIDE HCL 5 MG PO TABS
10.0000 mg | ORAL_TABLET | Freq: Three times a day (TID) | ORAL | Status: DC
  Administered 2021-10-26 – 2021-10-27 (×5): 10 mg via ORAL
  Filled 2021-10-26 (×5): qty 2

## 2021-10-26 MED ORDER — PANCRELIPASE (LIP-PROT-AMYL) 36000-114000 UNITS PO CPEP
72000.0000 [IU] | ORAL_CAPSULE | Freq: Three times a day (TID) | ORAL | Status: DC
  Administered 2021-10-26 – 2021-10-27 (×4): 72000 [IU] via ORAL
  Filled 2021-10-26 (×4): qty 2

## 2021-10-26 MED ORDER — ENSURE ENLIVE PO LIQD
237.0000 mL | Freq: Two times a day (BID) | ORAL | Status: DC
  Administered 2021-10-26 – 2021-10-27 (×3): 237 mL via ORAL

## 2021-10-26 MED ORDER — FAMOTIDINE 20 MG PO TABS
20.0000 mg | ORAL_TABLET | Freq: Two times a day (BID) | ORAL | Status: DC
  Administered 2021-10-26 – 2021-10-27 (×3): 20 mg via ORAL
  Filled 2021-10-26 (×3): qty 1

## 2021-10-26 MED ORDER — PANCREAZE 21000-54700 UNITS PO CPEP
3.0000 | ORAL_CAPSULE | Freq: Three times a day (TID) | ORAL | 1 refills | Status: AC
  Filled 2021-10-26: qty 300, 34d supply, fill #0
  Filled 2021-11-30: qty 300, 34d supply, fill #1

## 2021-10-26 MED ORDER — ONDANSETRON HCL 4 MG/2ML IJ SOLN: 4.0000 mg | Freq: Four times a day (QID) | INTRAMUSCULAR | Status: DC | PRN

## 2021-10-26 MED ORDER — GABAPENTIN 100 MG PO CAPS
100.0000 mg | ORAL_CAPSULE | Freq: Three times a day (TID) | ORAL | Status: DC
  Administered 2021-10-26 – 2021-10-27 (×4): 100 mg via ORAL
  Filled 2021-10-26 (×4): qty 1

## 2021-10-26 MED ORDER — PROCHLORPERAZINE EDISYLATE 10 MG/2ML IJ SOLN: 10.0000 mg | INTRAMUSCULAR | Status: DC | PRN

## 2021-10-26 NOTE — Progress Notes (Signed)
Mobility Specialist Progress Note:   10/26/21 0930  Mobility  Activity Ambulated independently in hallway  Level of Assistance Independent  Assistive Device None  Distance Ambulated (ft) 550 ft  Activity Response Tolerated well  $Mobility charge 1 Mobility   Pt independently ambulating in hallway. No c/o pain throughout. Pt back in bed with all needs met.   Nelta Numbers Acute Rehab Secure Chat or Office Phone: [phone]

## 2021-10-26 NOTE — Plan of Care (Signed)
Vitals stable, still nauseated not vomiting episode.  Problem: Nutrition: Goal: Adequate nutrition will be maintained Outcome: Progressing   Problem: Pain Managment: Goal: General experience of comfort will improve Outcome: Progressing

## 2021-10-26 NOTE — Progress Notes (Signed)
  Progress Note Patient: Kyle Berry CMK:[id number] DOB: 06/09/1970 DOA: 10/24/2021  DOS: the patient was seen and examined on 10/26/2021  Brief hospital course: Past medical history of pancreatic divisum with recurrent pancreatitis SP cholecystectomy in 2019, congenital solitary kidney.  Now present to the hospital with complaints of diffuse abdominal pain along with nausea and some blood in the stool. Work-up is negative for acute pancreatitis. Assessment and Plan: Intractable nausea and vomiting. CT abdomen negative for any acute abnormality. Also does not appear to have any severe constipation. For now treat with scheduled Reglan. Continue PPI. Advance to full liquid diet and if does well advance to soft diet in the morning. Discontinue IV fluids.  Chronic pancreatitis. History of pancreatic divisum. Lipase normal. LFTs normal. CT abdomen negative for any acute abnormality. Patient has undergone major and minor papillotomy without any improvement in his abdominal pain in the past. We will prescribe pancreatic lipase on discharge. Recommended patient to avoid alcohol.  No indication to use narcotic pain medication in IV form in the hospital. Initiate gabapentin. Discussed with the patient that the patient will benefit from staying away from narcotics given their tendency for causing rebound pain.  Hypokalemia Replaced.  GERD Continue PPI  AKI ruled out. Moderate protein calorie malnutrition ruled out.  Subjective: No nausea no vomiting.  No fever or chills.  No chest pain.  Abdominal pain improving.  Passing gas.  Physical Exam: Vitals:   10/26/21 0407 10/26/21 0453 10/26/21 0852 10/26/21 1649  BP: 127/82 127/83 (!) 140/92 (!) 143/97  Pulse: 67 81 64 76  Resp: '16 17 16 16  '$ Temp: 98.3 F (36.8 C) 98.4 F (36.9 C) 97.8 F (36.6 C) 98 F (36.7 C)  TempSrc: Oral Oral Oral Oral  SpO2: 100% 100% 98% 100%  Weight:      Height:       General: Appear in mild  distress; no visible Abnormal Neck Mass Or lumps, Conjunctiva normal Cardiovascular: S1 and S2 Present, no Murmur, Respiratory: good respiratory effort, Bilateral Air entry present and CTA, no Crackles, no wheezes Abdomen: Bowel Sound present, mild diffuse tenderness Extremities: no Pedal edema Neurology: alert and oriented to time, place, and person   Data Reviewed: I have Reviewed nursing notes, Vitals, and Lab results since pt's last encounter. Pertinent lab results CBC and BMP I have ordered test including CBC and BMP    Family Communication: None at bedside  Disposition: Status is: Observation  Author: Berle Mull, MD 10/26/2021 5:54 PM  Please look on [email or web address] to find out who is on call.

## 2021-10-26 NOTE — Telephone Encounter (Signed)
Pharmacy Patient Advocate Encounter  Insurance verification completed.    The patient is insured through [employer]   The patient is currently admitted and ran test claims for the following:  Creon, zenpep, pancreaze, viokace .  Copays and coinsurance results were relayed to Inpatient clinical team.

## 2021-10-26 NOTE — Hospital Course (Signed)
Past medical history of pancreatic divisum with recurrent pancreatitis SP cholecystectomy in 2019, congenital solitary kidney.  Now present to the hospital with complaints of diffuse abdominal pain along with nausea and some blood in the stool. Work-up is negative for acute pancreatitis.

## 2021-10-26 NOTE — Plan of Care (Signed)
?  Problem: Nutrition: ?Goal: Adequate nutrition will be maintained ?Outcome: Progressing ?  ?Problem: Safety: ?Goal: Ability to remain free from injury will improve ?Outcome: Progressing ?  ?Problem: Pain Managment: ?Goal: General experience of comfort will improve ?Outcome: Not Progressing ?  ?

## 2021-10-26 NOTE — TOC CM/SW Note (Signed)
  Transition of Care [facility]) Screening Note   Patient Details  Name: Kyle Berry Date of Birth: [birth year]   Transition of Care Department [facility]) has reviewed patient and no TOC needs have been identified at this time. We will continue to monitor patient advancement through interdisciplinary progression rounds. If new patient transition needs arise, please place a TOC consult.

## 2021-10-26 NOTE — TOC Benefit Eligibility Note (Signed)
Patient Teacher, English as a foreign language completed.    The patient is currently admitted and upon discharge could be taking Creon 72,000 units .  Requires Prior Authorization  The patient is currently admitted and upon discharge could be taking zenpep 80,000 units . Requires Prior Authorization  The patient is currently admitted and upon discharge could be taking viokace 62640 units   Requires Prior Authorization  The patient is currently admitted and upon discharge could be taking pancreaze 63,000 units   30 day supply is $0.00  The patient is insured through [place], [place] Patient Advocate Specialist [place] Patient Advocate Team Direct Number: [phone]  Fax: [phone]

## 2021-10-27 ENCOUNTER — Other Ambulatory Visit (HOSPITAL_COMMUNITY): Payer: Self-pay

## 2021-10-27 DIAGNOSIS — R1110 Vomiting, unspecified: Secondary | ICD-10-CM | POA: Diagnosis not present

## 2021-10-27 MED ORDER — TRAMADOL HCL 50 MG PO TABS
50.0000 mg | ORAL_TABLET | Freq: Two times a day (BID) | ORAL | 0 refills | Status: DC | PRN
  Filled 2021-10-27: qty 10, 5d supply, fill #0

## 2021-10-27 MED ORDER — METOCLOPRAMIDE HCL 5 MG PO TABS
5.0000 mg | ORAL_TABLET | Freq: Three times a day (TID) | ORAL | 0 refills | Status: DC
  Filled 2021-10-27: qty 30, 8d supply, fill #0

## 2021-10-27 MED ORDER — GABAPENTIN 100 MG PO CAPS
100.0000 mg | ORAL_CAPSULE | Freq: Three times a day (TID) | ORAL | 0 refills | Status: DC
  Filled 2021-10-27: qty 90, 30d supply, fill #0

## 2021-10-27 NOTE — Plan of Care (Signed)
  Problem: Pain Managment: Goal: General experience of comfort will improve Outcome: Progressing   

## 2021-10-28 ENCOUNTER — Other Ambulatory Visit (HOSPITAL_COMMUNITY): Payer: Self-pay

## 2021-10-30 NOTE — Discharge Summary (Signed)
Physician Discharge Summary   Patient: Kyle Berry MRN: [id number] DOB: 1970-10-07  Admit date:     10/24/2021  Discharge date: 10/27/2021  Discharge Physician: Berle Mull  PCP: [facility]  Recommendations at discharge: Follow-up with PCP in 1 week.  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  Now present to the hospital with complaints of diffuse abdominal pain along with nausea and some blood in the stool. Work-up is negative for acute pancreatitis.  Assessment and Plan: Intractable nausea and vomiting. CT abdomen negative for any acute abnormality. Also does not appear to have any severe constipation. For now treat with scheduled Reglan. Continue PPI. Tolerating oral diet. Recommend to continue gastroparesis diet on discharge. Also will continue Reglan on discharge. Follow-up with PCP and GI as recommended   Chronic pancreatitis. History of pancreatic divisum. Lipase normal. LFTs normal. CT abdomen negative for any acute abnormality. Patient has undergone major and minor papillotomy without any improvement in his abdominal pain in the past. We will prescribe pancreatic lipase on discharge. Recommended patient to avoid alcohol.   No indication to use narcotic pain medication in IV form in the hospital. Initiate gabapentin. Discussed with the patient that the patient will benefit from staying away from narcotics given their tendency for causing rebound pain.   Hypokalemia Replaced.   GERD Continue PPI   AKI ruled out. Moderate protein calorie malnutrition ruled out.  Consultants: none Procedures performed:  none DISCHARGE MEDICATION: Allergies as of 10/27/2021        Reactions   Diclofenac Hives, Other (See Comments)   Fluid buildup in chest   Norvasc [amlodipine Besylate] Other (See Comments)   Fluid buildup in chest   Omeprazole Other (See Comments)   MD stopped due to pancreatitis   Hydrochlorothiazide    07/31/2016 -  07/20/2016 -  05/28/2016 -   Other Reaction(s) from [facility]: itching   Prednisone Hives, Other (See Comments)   Mood swings        Medication List     STOP taking these medications    lipase/protease/amylase 36000 UNITS Cpep capsule Commonly known as: CREON Replaced by: Pancreaze 21000-54700 units Cpep   ondansetron 4 MG disintegrating tablet Commonly known as: ZOFRAN-ODT       TAKE these medications    acetaminophen 500 MG tablet Commonly known as: TYLENOL Take 500 mg by mouth 2 (two) times daily as needed (pain).   famotidine 20 MG tablet Commonly known as: PEPCID Take 1 tablet (20 mg total) by mouth 2 (two) times daily.   feeding supplement Liqd Take 237 mLs by mouth 2 (two) times daily between meals. What changed:  when to take this additional instructions   gabapentin 100 MG capsule Commonly known as: NEURONTIN Take 1 capsule (100 mg total) by mouth 3 (three) times daily.   metoCLOPramide 5 MG tablet Commonly known as: REGLAN Take 1 tablet (5 mg total) by mouth 4 (four) times daily -  before meals and at bedtime.   Pancreaze 21000-54700 units Cpep Generic drug: Pancrelipase (Lip-Prot-Amyl) Take 3 capsules by mouth 3 (three) times daily with meals. Replaces: lipase/protease/amylase 36000 UNITS Cpep capsule   potassium chloride SA 20 MEQ tablet Commonly known as: KLOR-CON M Take 20 mEq by mouth daily.   telmisartan 40 MG tablet Commonly known as: MICARDIS  Take 1 tablet (40 mg total) by mouth daily.   traMADol 50 MG tablet Commonly known as: Ultram Take 1 tablet (50 mg total) by mouth every 12 (twelve) hours as needed.   VISINE-A OP Place 2 drops into both eyes daily.         Disposition: Home Diet recommendation: Cardiac diet  Discharge Exam: Vitals:   10/26/21 1649 10/26/21 1953 10/27/21 0534 10/27/21 0825  BP: (!) 143/97 (!) 141/93 (!) 152/99 (!) 138/92  Pulse: 76 68 68 71  Resp: '16 15 18 16  '$ Temp: 98 F (36.7 C) 98.5 F (36.9 C) 98 F (36.7 C) 98.3 F (36.8 C)  TempSrc: Oral Oral Oral Oral  SpO2: 100% 99% 100% 99%  Weight:      Height:       General: Appear in no distress; no visible Abnormal Neck Mass Or lumps, Conjunctiva normal Cardiovascular: S1 and S2 Present, no Murmur, Respiratory: good respiratory effort, Bilateral Air entry present and CTA, no Crackles, no wheezes Abdomen: Bowel Sound present, Non tender  Extremities: no Pedal edema Neurology: alert and oriented to time, place, and person  Gait not checked due to patient safety concerns Filed Weights   10/24/21 2218  Weight: 73 kg   Condition at discharge: stable  The results of significant diagnostics from this hospitalization (including imaging, microbiology, ancillary and laboratory) are listed below for reference.   Imaging Studies: CT ABDOMEN PELVIS W CONTRAST  Result Date: 10/25/2021 CLINICAL DATA:  Epigastric pain EXAM: CT ABDOMEN AND PELVIS WITH CONTRAST TECHNIQUE: Multidetector CT imaging of the abdomen and pelvis was performed using the standard protocol following bolus administration of intravenous contrast. RADIATION DOSE REDUCTION: This exam was performed according to the departmental dose-optimization program which includes automated exposure control, adjustment of the mA and/or kV according to patient size and/or use of iterative reconstruction technique. CONTRAST:  161m OMNIPAQUE IOHEXOL 300 MG/ML  SOLN COMPARISON:  09/19/2021 FINDINGS: Lower chest: No acute abnormality Hepatobiliary: No focal liver abnormality is seen. Status post cholecystectomy. No biliary dilatation. Mild diffuse low-density throughout the liver compatible with fatty infiltration. Pancreas:  Calcification within the pancreatic head again noted, likely related to chronic pancreatitis. No evidence of acute pancreatitis or ductal dilatation. Spleen: No focal abnormality.  Normal size. Adrenals/Urinary Tract: Adrenal glands and left kidney unremarkable. Right kidney is severely atrophic. No hydronephrosis. Urinary bladder unremarkable. Stomach/Bowel: Normal appendix. Stomach, large and small bowel grossly unremarkable. Vascular/Lymphatic: No evidence of aneurysm or adenopathy. Reproductive: No visible focal abnormality. Other: No free fluid or free air. Musculoskeletal: No acute bony abnormality. IMPRESSION: Hepatic steatosis. No evidence of acute pancreatitis. No acute findings. Electronically Signed   By: KRolm BaptiseM.D.   On: 10/25/2021 00:20    Microbiology: Results for orders placed or performed during the hospital encounter of 04/21/21  Urine Culture     Status: None   Collection Time: 04/21/21  6:23 AM   Specimen: Urine, Clean Catch  Result Value Ref Range Status   Specimen Description URINE, CLEAN CATCH  Final   Special Requests NONE  Final   Culture   Final    NO GROWTH Performed at [facility] [address], [place] [address]   Report Status 04/23/2021 FINAL  Final  Resp Panel by RT-PCR (Flu A&B, Covid) Nasopharyngeal Swab     Status: None   Collection Time: 04/21/21  5:21 PM   Specimen: Nasopharyngeal Swab; Nasopharyngeal(NP) swabs in vial transport medium  Result Value Ref Range Status  SARS Coronavirus 2 by RT PCR NEGATIVE NEGATIVE Final    Comment: (NOTE) SARS-CoV-2 target nucleic acids are NOT DETECTED.  The SARS-CoV-2 RNA is generally detectable in upper respiratory specimens during the acute phase of infection. The lowest concentration of SARS-CoV-2 viral copies this assay can detect is 138 copies/mL. A negative result does not preclude SARS-Cov-2 infection and should not be used as the sole basis for treatment or other patient management  decisions. A negative result may occur with  improper specimen collection/handling, submission of specimen other than nasopharyngeal swab, presence of viral mutation(s) within the areas targeted by this assay, and inadequate number of viral copies(<138 copies/mL). A negative result must be combined with clinical observations, patient history, and epidemiological information. The expected result is Negative.  Fact Sheet for Patients:  EntrepreneurPulse.com.au  Fact Sheet for Healthcare Providers:  IncredibleEmployment.be  This test is no t yet approved or cleared by the [place] FDA and  has been authorized for detection and/or diagnosis of SARS-CoV-2 by FDA under an Emergency Use Authorization (EUA). This EUA will remain  in effect (meaning this test can be used) for the duration of the COVID-19 declaration under Section 564(b)(1) of the Act, 21 U.S.C.section 360bbb-3(b)(1), unless the authorization is terminated  or revoked sooner.       Influenza A by PCR NEGATIVE NEGATIVE Final   Influenza B by PCR NEGATIVE NEGATIVE Final    Comment: (NOTE) The Xpert Xpress SARS-CoV-2/FLU/RSV plus assay is intended as an aid in the diagnosis of influenza from Nasopharyngeal swab specimens and should not be used as a sole basis for treatment. Nasal washings and aspirates are unacceptable for Xpert Xpress SARS-CoV-2/FLU/RSV testing.  Fact Sheet for Patients: EntrepreneurPulse.com.au  Fact Sheet for Healthcare Providers: IncredibleEmployment.be  This test is not yet approved or cleared by the [place] FDA and has been authorized for detection and/or diagnosis of SARS-CoV-2 by FDA under an Emergency Use Authorization (EUA). This EUA will remain in effect (meaning this test can be used) for the duration of the COVID-19 declaration under Section 564(b)(1) of the Act, 21 U.S.C. section 360bbb-3(b)(1), unless the  authorization is terminated or revoked.  Performed at [facility], [place] [address] [place], [place] [address]    Labs: CBC: Recent Labs  Lab 10/24/21 2220 10/26/21 0113  WBC 7.3 5.1  NEUTROABS 4.4  --   HGB 13.2 12.2*  HCT 40.5 35.4*  MCV 106.6* 101.1*  PLT 165 076   Basic Metabolic Panel: Recent Labs  Lab 10/24/21 2220 10/26/21 0113  NA 137 138  K 3.3* 3.3*  CL 105 106  CO2 19* 26  GLUCOSE 126* 113*  BUN 9 <5*  CREATININE 1.16 1.16  CALCIUM 9.0 8.4*   Liver Function Tests: Recent Labs  Lab 10/24/21 2220  AST 49*  ALT 25  ALKPHOS 64  BILITOT 1.1  PROT 7.1  ALBUMIN 3.7   CBG: No results for input(s): "GLUCAP" in the last 168 hours.  Discharge time spent: greater than 30 minutes.  Signed: Berle Mull, MD [facility] 10/27/2021

## 2021-11-15 ENCOUNTER — Other Ambulatory Visit: Payer: Self-pay

## 2021-11-16 ENCOUNTER — Other Ambulatory Visit: Payer: Self-pay | Admitting: Internal Medicine

## 2021-11-16 ENCOUNTER — Other Ambulatory Visit: Payer: Self-pay

## 2021-11-17 ENCOUNTER — Other Ambulatory Visit: Payer: Self-pay

## 2021-11-21 ENCOUNTER — Other Ambulatory Visit (HOSPITAL_COMMUNITY): Payer: Self-pay

## 2021-11-25 ENCOUNTER — Other Ambulatory Visit: Payer: Self-pay

## 2021-11-26 ENCOUNTER — Other Ambulatory Visit: Payer: Self-pay

## 2021-11-30 ENCOUNTER — Other Ambulatory Visit: Payer: Self-pay

## 2021-12-01 ENCOUNTER — Other Ambulatory Visit: Payer: Self-pay

## 2021-12-01 ENCOUNTER — Other Ambulatory Visit (HOSPITAL_COMMUNITY): Payer: Self-pay

## 2021-12-03 ENCOUNTER — Other Ambulatory Visit: Payer: Self-pay

## 2021-12-04 ENCOUNTER — Other Ambulatory Visit: Payer: Self-pay

## 2021-12-05 ENCOUNTER — Other Ambulatory Visit: Payer: Self-pay

## 2021-12-24 ENCOUNTER — Emergency Department (HOSPITAL_COMMUNITY): Payer: Managed Care, Other (non HMO)

## 2021-12-24 ENCOUNTER — Other Ambulatory Visit: Payer: Self-pay

## 2021-12-24 ENCOUNTER — Encounter (HOSPITAL_COMMUNITY): Payer: Self-pay

## 2021-12-24 ENCOUNTER — Emergency Department (HOSPITAL_COMMUNITY)
Admission: EM | Admit: 2021-12-24 | Discharge: 2021-12-24 | Disposition: A | Discharge status: Home / Self Care | Payer: Managed Care, Other (non HMO) | Attending: Emergency Medicine | Admitting: Emergency Medicine

## 2021-12-24 DIAGNOSIS — R109 Unspecified abdominal pain: Secondary | ICD-10-CM | POA: Diagnosis present

## 2021-12-24 DIAGNOSIS — R000 Tachycardia, unspecified: Secondary | ICD-10-CM | POA: Insufficient documentation

## 2021-12-24 DIAGNOSIS — D72829 Elevated white blood cell count, unspecified: Secondary | ICD-10-CM | POA: Insufficient documentation

## 2021-12-24 DIAGNOSIS — K858 Other acute pancreatitis without necrosis or infection: Secondary | ICD-10-CM | POA: Diagnosis not present

## 2021-12-24 LAB — CBC WITH DIFFERENTIAL/PLATELET
Abs Immature Granulocytes: 0.04 10*3/uL (ref 0.00–0.07)
Basophils Absolute: 0 10*3/uL (ref 0.0–0.1)
Basophils Relative: 0 %
Eosinophils Absolute: 0 10*3/uL (ref 0.0–0.5)
Eosinophils Relative: 0 %
HCT: 41 % (ref 39.0–52.0)
Hemoglobin: 14.4 g/dL (ref 13.0–17.0)
Immature Granulocytes: 0 %
Lymphocytes Relative: 10 %
Lymphs Abs: 1.3 10*3/uL (ref 0.7–4.0)
MCH: 34.9 pg — ABNORMAL HIGH (ref 26.0–34.0)
MCHC: 35.1 g/dL (ref 30.0–36.0)
MCV: 99.3 fL (ref 80.0–100.0)
Monocytes Absolute: 0.7 10*3/uL (ref 0.1–1.0)
Monocytes Relative: 5 %
Neutro Abs: 11.5 10*3/uL — ABNORMAL HIGH (ref 1.7–7.7)
Neutrophils Relative %: 85 %
Platelets: 227 10*3/uL (ref 150–400)
RBC: 4.13 MIL/uL — ABNORMAL LOW (ref 4.22–5.81)
RDW: 15 % (ref 11.5–15.5)
WBC: 13.6 10*3/uL — ABNORMAL HIGH (ref 4.0–10.5)
nRBC: 0 % (ref 0.0–0.2)

## 2021-12-24 LAB — COMPREHENSIVE METABOLIC PANEL
ALT: 37 U/L (ref 0–44)
AST: 42 U/L — ABNORMAL HIGH (ref 15–41)
Albumin: 3.6 g/dL (ref 3.5–5.0)
Alkaline Phosphatase: 87 U/L (ref 38–126)
Anion gap: 12 (ref 5–15)
BUN: 11 mg/dL (ref 6–20)
CO2: 22 mmol/L (ref 22–32)
Calcium: 9 mg/dL (ref 8.9–10.3)
Chloride: 102 mmol/L (ref 98–111)
Creatinine, Ser: 1.18 mg/dL (ref 0.61–1.24)
GFR, Estimated: >60 mL/min (ref >60)
Glucose, Bld: 133 mg/dL — ABNORMAL HIGH (ref 70–99)
Potassium: 3.5 mmol/L (ref 3.5–5.1)
Sodium: 136 mmol/L (ref 135–145)
Total Bilirubin: 1.1 mg/dL (ref 0.3–1.2)
Total Protein: 7.2 g/dL (ref 6.5–8.1)

## 2021-12-24 LAB — URINALYSIS, ROUTINE W REFLEX MICROSCOPIC
Bacteria, UA: NONE SEEN
Bilirubin Urine: NEGATIVE
Glucose, UA: NEGATIVE mg/dL
Hgb urine dipstick: NEGATIVE
Ketones, ur: NEGATIVE mg/dL
Leukocytes,Ua: NEGATIVE
Nitrite: NEGATIVE
Protein, ur: 30 mg/dL — AB
Specific Gravity, Urine: 1.026 (ref 1.005–1.030)
pH: 5 (ref 5.0–8.0)

## 2021-12-24 LAB — LIPASE, BLOOD: Lipase: 111 U/L — ABNORMAL HIGH (ref 11–51)

## 2021-12-24 MED ORDER — LACTATED RINGERS IV BOLUS
2000.0000 mL | Freq: Once | INTRAVENOUS | Status: AC
  Administered 2021-12-24: 2000 mL via INTRAVENOUS

## 2021-12-24 MED ORDER — MORPHINE SULFATE (PF) 4 MG/ML IV SOLN: 4.0000 mg | Freq: Once | INTRAVENOUS | Status: DC

## 2021-12-24 MED ORDER — HYDROMORPHONE HCL 1 MG/ML IJ SOLN
1.0000 mg | Freq: Once | INTRAMUSCULAR | Status: AC
  Administered 2021-12-24: 1 mg via INTRAVENOUS
  Filled 2021-12-24: qty 1

## 2021-12-24 MED ORDER — OXYCODONE-ACETAMINOPHEN 5-325 MG PO TABS
1.0000 | ORAL_TABLET | Freq: Once | ORAL | Status: AC
  Administered 2021-12-24: 1 via ORAL
  Filled 2021-12-24: qty 1

## 2021-12-24 MED ORDER — IOHEXOL 300 MG/ML  SOLN
100.0000 mL | Freq: Once | INTRAMUSCULAR | Status: AC | PRN
  Administered 2021-12-24: 100 mL via INTRAVENOUS

## 2021-12-24 MED ORDER — MORPHINE SULFATE (PF) 4 MG/ML IV SOLN
6.0000 mg | Freq: Once | INTRAVENOUS | Status: AC
  Administered 2021-12-24: 6 mg via INTRAVENOUS
  Filled 2021-12-24: qty 2

## 2021-12-24 MED ORDER — OXYCODONE-ACETAMINOPHEN 5-325 MG PO TABS: 1.0000 | ORAL_TABLET | Freq: Four times a day (QID) | ORAL | 0 refills | Status: DC | PRN

## 2021-12-24 MED ORDER — OXYCODONE-ACETAMINOPHEN 5-325 MG PO TABS
2.0000 | ORAL_TABLET | Freq: Once | ORAL | Status: AC
  Administered 2021-12-24: 2 via ORAL
  Filled 2021-12-24: qty 2

## 2021-12-24 MED ORDER — ONDANSETRON 4 MG PO TBDP: 4.0000 mg | ORAL_TABLET | Freq: Three times a day (TID) | ORAL | 0 refills | Status: DC | PRN

## 2021-12-24 MED ORDER — ONDANSETRON HCL 4 MG/2ML IJ SOLN
4.0000 mg | Freq: Once | INTRAMUSCULAR | Status: AC
  Administered 2021-12-24: 4 mg via INTRAVENOUS
  Filled 2021-12-24: qty 2

## 2021-12-24 NOTE — ED Provider Notes (Signed)
[place] EMERGENCY DEPARTMENT Provider Note   CSN: [id number] Arrival date & time: 12/24/21  1005     History  No chief complaint on file.   CARZELL SALDIVAR is a 51 y.o. male.  HPI Patient is a 51 year old male with past medical history significant for pancreatitis has required admission multiple times as you are ready.  He states that began having nausea vomiting abdominal pain this morning he states that it came on suddenly and was severe.  He states that it is similar to prior episodes of pancreatitis.  He states that he has had multiple episodes of nonbloody nonbilious emesis.  He denies any alcohol intake.  He states he has a history of cholecystectomy  Denies any chest pain or difficulty breathing.  No changes with bowel movements no constipation or diarrhea.  No fevers.  No urinary frequency urgency dysuria or hematuria.    Home Medications Prior to Admission medications   Medication Sig Start Date End Date Taking? Authorizing Provider  acetaminophen (TYLENOL) 500 MG tablet Take 500 mg by mouth 2 (two) times daily as needed (pain).    [provider]  famotidine (PEPCID) 20 MG tablet Take 1 tablet (20 mg total) by mouth 2 (two) times daily. 09/26/21 03/25/22  Barb Merino, MD  feeding supplement (ENSURE ENLIVE / ENSURE PLUS) LIQD Take 237 mLs by mouth 2 (two) times daily between meals. Patient taking differently: Take 237 mLs by mouth See admin instructions. Drink 237 ml by mouth 2-3 times a day between meals 02/04/21   Dahal, Marlowe Aschoff, MD  gabapentin (NEURONTIN) 100 MG capsule Take 1 capsule (100 mg total) by mouth 3 (three) times daily. 10/27/21   Lavina Hamman, MD  metoCLOPramide (REGLAN) 5 MG tablet Take 1 tablet (5 mg total) by mouth 4 (four) times daily -  before meals and at bedtime. 10/27/21   Lavina Hamman, MD  Naphazoline-Pheniramine (VISINE-A OP) Place 2 drops into both eyes daily.    [provider]  Pancrelipase,  Lip-Prot-Amyl, (PANCREAZE) 21000-54700 units CPEP Take 3 capsules by mouth 3 (three) times daily with meals. 10/26/21 01/09/22  Lavina Hamman, MD  potassium chloride SA (KLOR-CON M) 20 MEQ tablet Take 20 mEq by mouth daily. 09/07/21   [provider]  telmisartan (MICARDIS) 40 MG tablet Take 1 tablet (40 mg total) by mouth daily. 04/26/21 05/26/21  Idamae Schuller, MD  traMADol (ULTRAM) 50 MG tablet Take 1 tablet (50 mg total) by mouth every 12 (twelve) hours as needed. 10/27/21 10/27/22  Lavina Hamman, MD      Allergies    Diclofenac, Norvasc [amlodipine besylate], Omeprazole, Hydrochlorothiazide, and Prednisone    Review of Systems   Review of Systems  Physical Exam Updated Vital Signs BP (!) 162/98   Pulse (!) 108   Temp 98 F (36.7 C) (Oral)   Resp (!) 25   SpO2 94%  Physical Exam Vitals and nursing note reviewed.  Constitutional:      General: He is in acute distress.  HENT:     Head: Normocephalic and atraumatic.     Nose: Nose normal.  Eyes:     General: No scleral icterus. Cardiovascular:     Rate and Rhythm: Regular rhythm. Tachycardia present.     Pulses: Normal pulses.     Heart sounds: Normal heart sounds.  Pulmonary:     Effort: Pulmonary effort is normal. No respiratory distress.     Breath sounds: No wheezing.  Abdominal:  Palpations: Abdomen is soft.     Tenderness: There is abdominal tenderness.     Comments: Epigastrium is TTP  Musculoskeletal:     Cervical back: Normal range of motion.     Right lower leg: No edema.     Left lower leg: No edema.  Skin:    General: Skin is warm and dry.     Capillary Refill: Capillary refill takes less than 2 seconds.  Neurological:     Mental Status: He is alert. Mental status is at baseline.  Psychiatric:        Mood and Affect: Mood normal.        Behavior: Behavior normal.    ED Results / Procedures / Treatments   Labs (all labs ordered are listed, but only abnormal results are displayed) Labs  Reviewed  COMPREHENSIVE METABOLIC PANEL - Abnormal; Notable for the following components:      Result Value   Glucose, Bld 133 (*)    AST 42 (*)    All other components within normal limits  LIPASE, BLOOD - Abnormal; Notable for the following components:   Lipase 111 (*)    All other components within normal limits  CBC WITH DIFFERENTIAL/PLATELET - Abnormal; Notable for the following components:   WBC 13.6 (*)    RBC 4.13 (*)    MCH 34.9 (*)    Neutro Abs 11.5 (*)    All other components within normal limits  URINALYSIS, ROUTINE W REFLEX MICROSCOPIC - Abnormal; Notable for the following components:   Protein, ur 30 (*)    All other components within normal limits    EKG None  Radiology CT ABDOMEN PELVIS W CONTRAST  Result Date: 12/24/2021 CLINICAL DATA:  Pancreatitis. EXAM: CT ABDOMEN AND PELVIS WITH CONTRAST TECHNIQUE: Multidetector CT imaging of the abdomen and pelvis was performed using the standard protocol following bolus administration of intravenous contrast. RADIATION DOSE REDUCTION: This exam was performed according to the departmental dose-optimization program which includes automated exposure control, adjustment of the mA and/or kV according to patient size and/or use of iterative reconstruction technique. CONTRAST:  183m OMNIPAQUE IOHEXOL 300 MG/ML  SOLN COMPARISON:  10/25/2021 FINDINGS: Lower chest: No acute abnormality. Hepatobiliary: No focal liver abnormality is seen. Status post cholecystectomy. No biliary dilatation. Pancreas: There is diffuse edema involving the pancreas compatible with acute pancreatitis. Peripancreatic fat stranding is identified. No main duct dilatation. No focal fluid collection identified to suggest pseudocyst. Calcification within the head of pancreas likely reflects chronic pancreatitis. Spleen: Normal in size without focal abnormality. Adrenals/Urinary Tract: Normal adrenal glands. Atrophic end-stage right kidney. The left kidney is unremarkable.  No left-sided hydronephrosis or mass. Urinary bladder is unremarkable. Stomach/Bowel: Stomach appears normal. The appendix is visualized and is within normal limits. No significant bowel wall thickening, inflammation, or distension. Vascular/Lymphatic: Normal appearance of the abdominal aorta. Portal vein, portal venous confluence and SMV appear normal. Proximal splenic vein is patent. The distal splenic vein is markedly attenuated with distal reconstitution at the splenic hilum no signs of abdominopelvic adenopathy. No inguinal adenopathy. Reproductive: Prostate is unremarkable. Other: Small amount of fluid extends into the left retroperitoneum. No focal fluid collections. No signs of pneumoperitoneum. Musculoskeletal: No acute abnormality. Degenerative disc disease noted at L5-S1. IMPRESSION: 1. Acute pancreatitis. No pseudocyst identified. 2. Atrophic end-stage right kidney. 3. Status post cholecystectomy. Electronically Signed   By: TKerby MoorsM.D.   On: 12/24/2021 13:28    Procedures Procedures    Medications Ordered in ED Medications  HYDROmorphone (  DILAUDID) injection 1 mg (has no administration in time range)  oxyCODONE-acetaminophen (PERCOCET/ROXICET) 5-325 MG per tablet 1 tablet (1 tablet Oral Given 12/24/21 1055)  iohexol (OMNIPAQUE) 300 MG/ML solution 100 mL (100 mLs Intravenous Contrast Given 12/24/21 1311)  lactated ringers bolus 2,000 mL (2,000 mLs Intravenous New Bag/Given 12/24/21 1727)  ondansetron (ZOFRAN) injection 4 mg (4 mg Intravenous Given 12/24/21 1723)  HYDROmorphone (DILAUDID) injection 1 mg (1 mg Intravenous Given 12/24/21 1723)    ED Course/ Medical Decision Making/ A&P Clinical Course as of 12/24/21 2108  Sun Dec 24, 2021  1713 IMPRESSION: 1. Acute pancreatitis. No pseudocyst identified. 2. Atrophic end-stage right kidney. 3. Status post cholecystectomy.   Electronically Signed   By: Kerby Moors M.D.   On: 12/24/2021 13:28   [WF]  1610 On reevaluation  patient is still quite uncomfortable.  We will repeat Dilaudid. [WF]  2036 Continued severe pain - morphine 6 + percocet [WF]    Clinical Course User Index [WF] Tedd Sias, [place]                           Medical Decision Making Risk Prescription drug management.   This patient presents to the ED for concern of epigastric pain, this involves a number of treatment options, and is a complaint that carries with it a moder/high risk of complications and morbidity.  The differential diagnosis includes pancreatitis, peptic ulcer disease, gastritis   Co morbidities: Discussed in HPI   Brief History:  Patient is a 51 year old male with past medical history significant for pancreatitis has required admission multiple times as you are ready.  He states that began having nausea vomiting abdominal pain this morning he states that it came on suddenly and was severe.  He states that it is similar to prior episodes of pancreatitis.  He states that he has had multiple episodes of nonbloody nonbilious emesis.  He denies any alcohol intake.  He states he has a history of cholecystectomy  Denies any chest pain or difficulty breathing.  No changes with bowel movements no constipation or diarrhea.  No fevers.  No urinary frequency urgency dysuria or hematuria.    EMR reviewed including pt PMHx, past surgical history and past visits to ER.   See HPI for more details   Lab Tests:   I ordered and independently interpreted labs. Labs notable for elevated lipase, leukocytosis.  CMP with persistent mild elevation of AST and no significant change from prior.  Status post cholecystectomy.  Urinalysis unremarkable apart from protein present dehydration.  Imaging Studies:  Abnormal findings. I personally reviewed all imaging studies. Imaging notable for  Pancreatitis without evidence of pseudocyst or abscess or other complication.  Cardiac Monitoring:  The patient was maintained on a cardiac  monitor.  I personally viewed and interpreted the cardiac monitored which showed an underlying rhythm of: Sinus tachycardia EKG non-ischemic   Medicines ordered:  I ordered medication including morphine, Percocet, Dilaudid for pain zofran for nausea.  Reevaluation of the patient after these medicines showed that the patient improved I have reviewed the patients home medicines and have made adjustments as needed   Critical Interventions:  Analgesia   Consults/Attending Physician      Reevaluation:  After the interventions noted above I re-evaluated patient and found that they have :resolved   Social Determinants of Health:      Problem List / ED Course:  Abdominal pain, nausea, vomiting all secondary to pancreatitis.  Patient feels much improved after analgesics here.  Is tolerating p.o.  Patient states that his pain is completely resolved.  Abdomen soft nontender.  No guarding or rebound.  CT without complications of pancreatitis.  Tolerating p.o. and agreeable to discharge home.  Will return to the ER for any new or concerning symptoms.   Dispostion:  After consideration of the diagnostic results and the patients response to treatment, I feel that the patent would benefit from close outpatient follow-up with PCP.  Very strict return precautions were discussed.   Final Clinical Impression(s) / ED Diagnoses Final diagnoses:  Other acute pancreatitis without infection or necrosis    Rx / DC Orders ED Discharge Orders     None         Tedd Sias, [place] 12/24/21 2247    Jeanell Sparrow, DO 12/25/21 0012

## 2021-12-24 NOTE — ED Provider Triage Note (Signed)
Emergency Medicine Provider Triage Evaluation Note  Kyle Berry , a 51 y.o. male  was evaluated in triage.  Pt complains of abdominal pain, nausea, and vomiting. He states that same began suddenly this morning and is severe in nature.  He states it feels similar to flare of pancreatitis that he has had previously.  He states that alcohol is normally the flare for his pancreatitis, however he has not been drinking alcohol recently.  Review of Systems  Positive:  Negative:   Physical Exam  BP (!) 141/108 (BP Location: Right Arm)   Pulse (!) 103   Temp 98 F (36.7 C) (Oral)   Resp 18   SpO2 98%  Gen:   Awake, no distress obvious discomfort Resp:  Normal effort  MSK:   Moves extremities without difficulty  Other:  Epigastric tenderness  Medical Decision Making  Medically screening exam initiated at 10:50 AM.  Appropriate orders placed.  [name] was informed that the remainder of the evaluation will be completed by another provider, this initial triage assessment does not replace that evaluation, and the importance of remaining in the ED until their evaluation is complete.     Bud Face, PA-C 12/24/21 1053

## 2021-12-24 NOTE — Discharge Instructions (Signed)
The attached information, take Percocet as needed for pain.  Take Tylenol 1000 mg every 6 hours unless you take a Percocet as Percocet has 250 mg of Tylenol in it.  Do not take more than 4000 mg of Tylenol per day  Make sure you drink plenty of water, follow a liquid diet

## 2021-12-24 NOTE — ED Triage Notes (Signed)
Patient arrived by EMS with complaint of vomiting and diarrhea since 0200. Has hx of pancreatitis and reports that he thinks this is same as previous. Denies ETOH

## 2021-12-26 ENCOUNTER — Other Ambulatory Visit (HOSPITAL_COMMUNITY): Payer: Self-pay

## 2022-01-09 ENCOUNTER — Other Ambulatory Visit (HOSPITAL_COMMUNITY): Payer: Self-pay

## 2022-01-23 ENCOUNTER — Emergency Department (HOSPITAL_COMMUNITY): Payer: Managed Care, Other (non HMO)

## 2022-01-23 ENCOUNTER — Other Ambulatory Visit: Payer: Self-pay

## 2022-01-23 ENCOUNTER — Inpatient Hospital Stay (HOSPITAL_COMMUNITY)
Admission: EM | Admit: 2022-01-23 | Discharge: 2022-01-26 | DRG: 439 | Disposition: A | Discharge status: Home / Self Care | Payer: Managed Care, Other (non HMO) | Attending: Internal Medicine | Admitting: Internal Medicine

## 2022-01-23 ENCOUNTER — Encounter (HOSPITAL_COMMUNITY): Payer: Self-pay

## 2022-01-23 DIAGNOSIS — K859 Acute pancreatitis without necrosis or infection, unspecified: Secondary | ICD-10-CM | POA: Diagnosis not present

## 2022-01-23 DIAGNOSIS — I10 Essential (primary) hypertension: Secondary | ICD-10-CM | POA: Diagnosis present

## 2022-01-23 DIAGNOSIS — E7800 Pure hypercholesterolemia, unspecified: Secondary | ICD-10-CM | POA: Diagnosis present

## 2022-01-23 DIAGNOSIS — D696 Thrombocytopenia, unspecified: Secondary | ICD-10-CM | POA: Diagnosis present

## 2022-01-23 DIAGNOSIS — Q453 Other congenital malformations of pancreas and pancreatic duct: Secondary | ICD-10-CM

## 2022-01-23 DIAGNOSIS — K861 Other chronic pancreatitis: Secondary | ICD-10-CM | POA: Diagnosis present

## 2022-01-23 DIAGNOSIS — Z8249 Family history of ischemic heart disease and other diseases of the circulatory system: Secondary | ICD-10-CM

## 2022-01-23 DIAGNOSIS — Z888 Allergy status to other drugs, medicaments and biological substances status: Secondary | ICD-10-CM

## 2022-01-23 DIAGNOSIS — Z79899 Other long term (current) drug therapy: Secondary | ICD-10-CM

## 2022-01-23 DIAGNOSIS — K3184 Gastroparesis: Secondary | ICD-10-CM | POA: Diagnosis present

## 2022-01-23 DIAGNOSIS — K219 Gastro-esophageal reflux disease without esophagitis: Secondary | ICD-10-CM | POA: Diagnosis present

## 2022-01-23 DIAGNOSIS — Z841 Family history of disorders of kidney and ureter: Secondary | ICD-10-CM

## 2022-01-23 DIAGNOSIS — D539 Nutritional anemia, unspecified: Secondary | ICD-10-CM | POA: Diagnosis present

## 2022-01-23 DIAGNOSIS — Z800 Family history of malignant neoplasm of digestive organs: Secondary | ICD-10-CM

## 2022-01-23 DIAGNOSIS — Q600 Renal agenesis, unilateral: Secondary | ICD-10-CM

## 2022-01-23 LAB — COMPREHENSIVE METABOLIC PANEL
ALT: 25 U/L (ref 0–44)
AST: 54 U/L — ABNORMAL HIGH (ref 15–41)
Albumin: 3.5 g/dL (ref 3.5–5.0)
Alkaline Phosphatase: 68 U/L (ref 38–126)
Anion gap: 8 (ref 5–15)
BUN: <5 mg/dL — ABNORMAL LOW (ref 6–20)
CO2: 24 mmol/L (ref 22–32)
Calcium: 8.7 mg/dL — ABNORMAL LOW (ref 8.9–10.3)
Chloride: 106 mmol/L (ref 98–111)
Creatinine, Ser: 1.17 mg/dL (ref 0.61–1.24)
GFR, Estimated: >60 mL/min (ref >60)
Glucose, Bld: 117 mg/dL — ABNORMAL HIGH (ref 70–99)
Potassium: 3.8 mmol/L (ref 3.5–5.1)
Sodium: 138 mmol/L (ref 135–145)
Total Bilirubin: 1.1 mg/dL (ref 0.3–1.2)
Total Protein: 6.7 g/dL (ref 6.5–8.1)

## 2022-01-23 LAB — CBC
HCT: 40.5 % (ref 39.0–52.0)
Hemoglobin: 13.5 g/dL (ref 13.0–17.0)
MCH: 35.2 pg — ABNORMAL HIGH (ref 26.0–34.0)
MCHC: 33.3 g/dL (ref 30.0–36.0)
MCV: 105.5 fL — ABNORMAL HIGH (ref 80.0–100.0)
Platelets: 161 10*3/uL (ref 150–400)
RBC: 3.84 MIL/uL — ABNORMAL LOW (ref 4.22–5.81)
RDW: 14.5 % (ref 11.5–15.5)
WBC: 7.5 10*3/uL (ref 4.0–10.5)
nRBC: 0 % (ref 0.0–0.2)

## 2022-01-23 LAB — LACTIC ACID, PLASMA: Lactic Acid, Venous: 1.8 mmol/L (ref 0.5–1.9)

## 2022-01-23 LAB — LIPASE, BLOOD: Lipase: 156 U/L — ABNORMAL HIGH (ref 11–51)

## 2022-01-23 MED ORDER — MORPHINE SULFATE (PF) 2 MG/ML IV SOLN
4.0000 mg | Freq: Once | INTRAVENOUS | Status: AC
  Administered 2022-01-23: 4 mg via INTRAVENOUS
  Filled 2022-01-23: qty 2

## 2022-01-23 MED ORDER — OXYCODONE-ACETAMINOPHEN 5-325 MG PO TABS
1.0000 | ORAL_TABLET | Freq: Once | ORAL | Status: AC
  Administered 2022-01-23: 1 via ORAL
  Filled 2022-01-23: qty 1

## 2022-01-23 MED ORDER — IOHEXOL 350 MG/ML SOLN
75.0000 mL | Freq: Once | INTRAVENOUS | Status: AC | PRN
  Administered 2022-01-23: 75 mL via INTRAVENOUS

## 2022-01-23 MED ORDER — ONDANSETRON HCL 4 MG/2ML IJ SOLN
4.0000 mg | Freq: Once | INTRAMUSCULAR | Status: AC
  Administered 2022-01-23: 4 mg via INTRAVENOUS
  Filled 2022-01-23: qty 2

## 2022-01-23 MED ORDER — ONDANSETRON 4 MG PO TBDP
4.0000 mg | ORAL_TABLET | Freq: Once | ORAL | Status: AC
  Administered 2022-01-23: 4 mg via ORAL
  Filled 2022-01-23: qty 1

## 2022-01-23 NOTE — ED Provider Triage Note (Signed)
Emergency Medicine Provider Triage Evaluation Note  Kyle Berry , a 51 y.o. male  was evaluated in triage.  Pt complains of epigastric pain x2 weeks.  History of chronic pancreatitis.  Notes he feels like he has pancreatitis currently.  Admits to vomiting.  No longer drinks alcohol.  Review of Systems  Positive: Abdominal pain Negative: CP  Physical Exam  BP (!) 150/124 (BP Location: Right Arm)   Pulse (!) 107   Temp 99.3 F (37.4 C) (Oral)   Resp (!) 22   Ht '6\' 1"'$  (1.854 m)   Wt 73 kg   SpO2 98%   BMI 21.24 kg/m  Gen:   Awake, no distress   Resp:  Normal effort  MSK:   Moves extremities without difficulty  Other:  TTP epigastric region  Medical Decision Making  Medically screening exam initiated at 2:30 PM.  Appropriate orders placed.  [name] was informed that the remainder of the evaluation will be completed by another provider, this initial triage assessment does not replace that evaluation, and the importance of remaining in the ED until their evaluation is complete.  Labs CT abdomen  IV morphine and zofran given   Suzy Bouchard, PA-C 01/23/22 1432

## 2022-01-23 NOTE — ED Notes (Signed)
Unable to urinate at this time.  

## 2022-01-23 NOTE — ED Triage Notes (Signed)
Reports hx of pancreatitis with flare up x 2 weeks and reports he sometimes can get it to go away at home but this has not gone away.  Reports vomiting.  Pancreatitis due to malfunction bilary duct.

## 2022-01-24 ENCOUNTER — Encounter (HOSPITAL_COMMUNITY): Payer: Self-pay | Admitting: Internal Medicine

## 2022-01-24 DIAGNOSIS — I10 Essential (primary) hypertension: Secondary | ICD-10-CM | POA: Diagnosis present

## 2022-01-24 DIAGNOSIS — Z800 Family history of malignant neoplasm of digestive organs: Secondary | ICD-10-CM | POA: Diagnosis not present

## 2022-01-24 DIAGNOSIS — K3184 Gastroparesis: Secondary | ICD-10-CM | POA: Diagnosis present

## 2022-01-24 DIAGNOSIS — Z8249 Family history of ischemic heart disease and other diseases of the circulatory system: Secondary | ICD-10-CM | POA: Diagnosis not present

## 2022-01-24 DIAGNOSIS — Z841 Family history of disorders of kidney and ureter: Secondary | ICD-10-CM | POA: Diagnosis not present

## 2022-01-24 DIAGNOSIS — K859 Acute pancreatitis without necrosis or infection, unspecified: Principal | ICD-10-CM

## 2022-01-24 DIAGNOSIS — Q600 Renal agenesis, unilateral: Secondary | ICD-10-CM | POA: Diagnosis not present

## 2022-01-24 DIAGNOSIS — Z79899 Other long term (current) drug therapy: Secondary | ICD-10-CM | POA: Diagnosis not present

## 2022-01-24 DIAGNOSIS — K861 Other chronic pancreatitis: Secondary | ICD-10-CM

## 2022-01-24 DIAGNOSIS — D539 Nutritional anemia, unspecified: Secondary | ICD-10-CM | POA: Diagnosis present

## 2022-01-24 DIAGNOSIS — Z888 Allergy status to other drugs, medicaments and biological substances status: Secondary | ICD-10-CM | POA: Diagnosis not present

## 2022-01-24 DIAGNOSIS — K219 Gastro-esophageal reflux disease without esophagitis: Secondary | ICD-10-CM | POA: Diagnosis present

## 2022-01-24 DIAGNOSIS — Q453 Other congenital malformations of pancreas and pancreatic duct: Secondary | ICD-10-CM | POA: Diagnosis not present

## 2022-01-24 DIAGNOSIS — E7800 Pure hypercholesterolemia, unspecified: Secondary | ICD-10-CM | POA: Diagnosis present

## 2022-01-24 DIAGNOSIS — D696 Thrombocytopenia, unspecified: Secondary | ICD-10-CM | POA: Diagnosis present

## 2022-01-24 LAB — URINALYSIS, ROUTINE W REFLEX MICROSCOPIC
Bilirubin Urine: NEGATIVE
Glucose, UA: NEGATIVE mg/dL
Ketones, ur: 5 mg/dL — AB
Leukocytes,Ua: NEGATIVE
Nitrite: NEGATIVE
Protein, ur: 30 mg/dL — AB
Specific Gravity, Urine: 1.017 (ref 1.005–1.030)
pH: 5 (ref 5.0–8.0)

## 2022-01-24 MED ORDER — METOCLOPRAMIDE HCL 5 MG/ML IJ SOLN
10.0000 mg | Freq: Once | INTRAMUSCULAR | Status: AC
  Administered 2022-01-24: 10 mg via INTRAVENOUS
  Filled 2022-01-24: qty 2

## 2022-01-24 MED ORDER — HYDROMORPHONE HCL 1 MG/ML IJ SOLN
1.0000 mg | Freq: Once | INTRAMUSCULAR | Status: AC
  Administered 2022-01-24: 1 mg via INTRAVENOUS
  Filled 2022-01-24: qty 1

## 2022-01-24 MED ORDER — LACTATED RINGERS IV SOLN: INTRAVENOUS | Status: DC

## 2022-01-24 MED ORDER — GABAPENTIN 100 MG PO CAPS
100.0000 mg | ORAL_CAPSULE | Freq: Three times a day (TID) | ORAL | Status: DC
  Administered 2022-01-24 – 2022-01-26 (×7): 100 mg via ORAL
  Filled 2022-01-24 (×8): qty 1

## 2022-01-24 MED ORDER — HYDRALAZINE HCL 20 MG/ML IJ SOLN: 5.0000 mg | INTRAMUSCULAR | Status: DC | PRN

## 2022-01-24 MED ORDER — IRBESARTAN 150 MG PO TABS
150.0000 mg | ORAL_TABLET | Freq: Every day | ORAL | Status: DC
  Administered 2022-01-24 – 2022-01-26 (×3): 150 mg via ORAL
  Filled 2022-01-24 (×3): qty 1

## 2022-01-24 MED ORDER — LACTATED RINGERS IV BOLUS: 1000.0000 mL | Freq: Once | INTRAVENOUS | Status: DC

## 2022-01-24 MED ORDER — ONDANSETRON HCL 4 MG PO TABS: 4.0000 mg | ORAL_TABLET | Freq: Four times a day (QID) | ORAL | Status: DC | PRN

## 2022-01-24 MED ORDER — MORPHINE SULFATE (PF) 2 MG/ML IV SOLN
2.0000 mg | INTRAVENOUS | Status: DC | PRN
  Administered 2022-01-24: 2 mg via INTRAVENOUS
  Filled 2022-01-24: qty 1

## 2022-01-24 MED ORDER — ACETAMINOPHEN 650 MG RE SUPP: 650.0000 mg | Freq: Four times a day (QID) | RECTAL | Status: DC | PRN

## 2022-01-24 MED ORDER — ONDANSETRON HCL 4 MG/2ML IJ SOLN
4.0000 mg | Freq: Once | INTRAMUSCULAR | Status: DC
  Filled 2022-01-24: qty 2

## 2022-01-24 MED ORDER — ONDANSETRON HCL 4 MG/2ML IJ SOLN
4.0000 mg | Freq: Four times a day (QID) | INTRAMUSCULAR | Status: DC | PRN
  Administered 2022-01-24 – 2022-01-26 (×3): 4 mg via INTRAVENOUS
  Filled 2022-01-24 (×4): qty 2

## 2022-01-24 MED ORDER — ACETAMINOPHEN 325 MG PO TABS: 650.0000 mg | ORAL_TABLET | Freq: Four times a day (QID) | ORAL | Status: DC | PRN

## 2022-01-24 MED ORDER — OXYCODONE-ACETAMINOPHEN 5-325 MG PO TABS
1.0000 | ORAL_TABLET | Freq: Once | ORAL | Status: DC
  Filled 2022-01-24: qty 1

## 2022-01-24 MED ORDER — MORPHINE SULFATE (PF) 4 MG/ML IV SOLN
4.0000 mg | INTRAVENOUS | Status: DC | PRN
  Administered 2022-01-24 – 2022-01-26 (×17): 4 mg via INTRAVENOUS
  Filled 2022-01-24 (×18): qty 1

## 2022-01-24 MED ORDER — SODIUM CHLORIDE 0.9 % IV BOLUS
1000.0000 mL | Freq: Once | INTRAVENOUS | Status: AC
  Administered 2022-01-24: 1000 mL via INTRAVENOUS

## 2022-01-24 NOTE — Progress Notes (Signed)
While rounding Chaplain stop to talk with patient.  Patient said he was still in pain. Waiting to be seem by EDP.  Provided prayer, emotional and spiritual support. Chaplain will follow as needed.  Althea Grimmer lain, [facility], Pager [phone]

## 2022-01-24 NOTE — H&P (Addendum)
History and Physical    Patient: Kyle Berry IOE:[id number] DOB: [birth year] DOA: 01/23/2022 DOS: the patient was seen and examined on 01/24/2022 PCP: [place]  Patient coming from:  Home - lives with girlfriend; NOK: Mother, Berlie Hatchel, [phone]   Chief Complaint: Abdominal pain, vomiting  HPI: Kyle Berry is a 51 y.o. male with medical history significant of chronic pancreatitis with h/o stent placement, pancreatic divisum; HTN; HLD; and solitary kidney presenting with abdominal pain, vomiting.   He had an MRI on 9/19 with normal appearing ducts. He reports having abdominal pain for about 2 weeks with n/v/d.  He knew it was his pancreatitis acting up.  The medication wasn't working - oxy/Percocet, phenergan.  He has vomited maybe 3-4 times overnight.  +diarrhea, 3 stools yesterday, no blood.  He is not sure what made it start this time.    ER Course:  Carryover, per Dr. Nevada Crane:  51 year old male with history of recurrent pancreatitis, pancreatic divisum status postcholecystectomy who presents with epigastric pain x2 weeks and constant.  Nonradiating.  Associated with nausea and vomiting.  Work-up in the ED revealed elevated lipase 3 times upper limit of normal, CT findings of acute pancreatitis.  Pain is not controlled in the ED.  EDP requested admission for further pain management.      Review of Systems: As mentioned in the history of present illness. All other systems reviewed and are negative. Past Medical History:  Diagnosis Date   Atrophy of right kidney    Chronic bronchitis (HCC)    Chronic lower back pain    Chronic pancreatitis ([place])    Gastroparesis 06/2020   confirmed by emptying study at [facility]   GERD (gastroesophageal reflux disease)    Headache    "once/month" (05/13/2017)   High cholesterol    Hypertension    Migraine    "a couple/year" (05/13/2017)   Nephrolithiasis 05/13/2017   Pancreatitis    Pneumonia ~  09/2015   Presence of pancreatic duct stent    Recurrent acute pancreatitis    Shortness of breath 05/22/2021   Past Surgical History:  Procedure Laterality Date   BIOPSY  11/02/2020   Procedure: BIOPSY;  Surgeon: Irving Copas., MD;  Location: [place];  Service: Gastroenterology;;   CHOLECYSTECTOMY N/A 10/23/2017   Procedure: LAPAROSCOPIC CHOLECYSTECTOMY WITH INTRAOPERATIVE CHOLANGIOGRAM;  Surgeon: Johnathan Hausen, MD;  Location: [facility];  Service: General;  Laterality: N/A;   COLONOSCOPY WITH PROPOFOL N/A 11/02/2019   Procedure: COLONOSCOPY WITH PROPOFOL;  Surgeon: Irene Shipper, MD;  Location: [facility];  Service: Endoscopy;  Laterality: N/A;   ERCP N/A 11/02/2020   Procedure: ENDOSCOPIC RETROGRADE CHOLANGIOPANCREATOGRAPHY (ERCP);  Surgeon: Irving Copas., MD;  Location: [place];  Service: Gastroenterology;  Laterality: N/A;   ESOPHAGOGASTRODUODENOSCOPY N/A 11/02/2020   Procedure: ESOPHAGOGASTRODUODENOSCOPY (EGD);  Surgeon: Irving Copas., MD;  Location: [place];  Service: Gastroenterology;  Laterality: N/A;   ESOPHAGOGASTRODUODENOSCOPY (EGD) WITH PROPOFOL N/A 11/06/2019   Procedure: ESOPHAGOGASTRODUODENOSCOPY (EGD) WITH PROPOFOL;  Surgeon: Irene Shipper, MD;  Location: [facility] ENDOSCOPY;  Service: Endoscopy;  Laterality: N/A;   HEMOSTASIS CLIP PLACEMENT  11/02/2019   Procedure: HEMOSTASIS CLIP PLACEMENT;  Surgeon: Irene Shipper, MD;  Location: [facility] END[facility]COPY;  Service: Endoscopy;;   NO PAST SURGERIES     REMOVAL OF STONES  11/02/2020   Procedure: REMOVAL OF STONES;  Surgeon: Irving Copas., MD;  Location: [place];  Service: Gastroenterology;;   Lavell Islam REMOVAL  11/02/2020  Procedure: STENT REMOVAL;  Surgeon: Irving Copas., MD;  Location: [place];  Service: Gastroenterology;;   Social History:  reports that he has never smoked. He has never used smokeless tobacco. He reports that he does not currently use alcohol. He reports that he  does not use drugs.  Allergies  Allergen Reactions   Diclofenac Hives and Other (See Comments)    Fluid buildup in chest   Norvasc [Amlodipine Besylate] Other (See Comments)    Fluid buildup in chest   Omeprazole Other (See Comments)    MD stopped due to pancreatitis   Hydrochlorothiazide     07/31/2016 -  07/20/2016 -  05/28/2016 -   Other Reaction(s) from [place]: itching   Prednisone Hives and Other (See Comments)    Mood swings    Family History  Problem Relation Age of Onset   Hypertension Other    Hypertension Mother    Hypertension Father    Kidney disease Father    Hypertension Sister    Diabetes Sister    Hypertension Brother    Pancreatic cancer Paternal Grandmother    Colon cancer Cousin    Stomach cancer Neg Hx    Esophageal cancer Neg Hx     Prior to Admission medications   Medication Sig Start Date End Date Taking? Authorizing Provider  famotidine (PEPCID) 20 MG tablet Take 1 tablet (20 mg total) by mouth 2 (two) times daily. 09/26/21 03/25/22  Barb Merino, MD  feeding supplement (ENSURE ENLIVE / ENSURE PLUS) LIQD Take 237 mLs by mouth 2 (two) times daily between meals. Patient taking differently: Take 237 mLs by mouth See admin instructions. Drink 237 ml by mouth 2-3 times a day between meals 02/04/21   Dahal, Marlowe Aschoff, MD  gabapentin (NEURONTIN) 100 MG capsule Take 1 capsule (100 mg total) by mouth 3 (three) times daily. 10/27/21   Lavina Hamman, MD  metoCLOPramide (REGLAN) 5 MG tablet Take 1 tablet (5 mg total) by mouth 4 (four) times daily -  before meals and at bedtime. 10/27/21   Lavina Hamman, MD  Naphazoline-Pheniramine (VISINE-A OP) Place 2 drops into both eyes daily.    [provider]  ondansetron (ZOFRAN-ODT) 4 MG disintegrating tablet Take 1 tablet (4 mg total) by mouth every 8 (eight) hours as needed for nausea or vomiting. 12/24/21   Tedd Sias, PA  oxyCODONE-acetaminophen (PERCOCET/ROXICET) 5-325 MG tablet Take 1 tablet by  mouth every 6 (six) hours as needed for severe pain. 12/24/21   Tedd Sias, PA  potassium chloride SA (KLOR-CON M) 20 MEQ tablet Take 20 mEq by mouth daily. 09/07/21   [provider]  telmisartan (MICARDIS) 40 MG tablet Take 1 tablet (40 mg total) by mouth daily. 04/26/21 05/26/21  Idamae Schuller, MD    Physical Exam: Vitals:   01/24/22 0500 01/24/22 0530 01/24/22 0554 01/24/22 0700  BP: (!) 156/94 (!) 171/110  (!) 176/100  Pulse: 71 (!) 101  75  Resp: '14 19  17  '$ Temp:   97.6 F (36.4 C)   TempSrc:   Oral   SpO2: 99% 100%  97%  Weight:      Height:       General:  Appears calm and comfortable and is in NAD Eyes:   EOMI, normal lids, iris ENT:  grossly normal hearing, lips & tongue, mmm; suboptimal dentition Neck:  no LAD, masses or thyromegaly Cardiovascular:  RRR, no m/r/g. No LE edema.  Respiratory:   CTA bilaterally with no  wheezes/rales/rhonchi.  Normal respiratory effort. Abdomen:  soft, diffusely tender without obvious peritoneal signs, ND, hyperactive BS Skin:  no rash or induration seen on limited exam Musculoskeletal:  grossly normal tone BUE/BLE, good ROM, no bony abnormality Psychiatric:  blunted mood and affect, speech fluent and appropriate, AOx3 Neurologic:  CN 2-12 grossly intact, moves all extremities in coordinated fashion   Radiological Exams on Admission: Independently reviewed - see discussion in A/P where applicable  CT ABDOMEN PELVIS W CONTRAST  Result Date: 01/23/2022 CLINICAL DATA:  Pancreatitis, acute, severe EXAM: CT ABDOMEN AND PELVIS WITH CONTRAST TECHNIQUE: Multidetector CT imaging of the abdomen and pelvis was performed using the standard protocol following bolus administration of intravenous contrast. RADIATION DOSE REDUCTION: This exam was performed according to the departmental dose-optimization program which includes automated exposure control, adjustment of the mA and/or kV according to patient size and/or use of iterative  reconstruction technique. CONTRAST:  36m OMNIPAQUE IOHEXOL 350 MG/ML SOLN COMPARISON:  None Available. FINDINGS: Lower chest: No acute abnormality. Hepatobiliary: The hepatic parenchyma is diffusely hypodense compared to the splenic parenchyma consistent with fatty infiltration. No focal liver abnormality. Status post cholecystectomy. No biliary dilatation. Pancreas: Nonspecific punctate calcification along uncinate process of pancreas. No focal lesion. Mild fat stranding along the proximal pancreas. The pancreatic parenchyma enhances normally. No main pancreatic ductal dilatation. Spleen: Normal in size without focal abnormality. Adrenals/Urinary Tract: No adrenal nodule bilaterally. Atrophic right kidney measuring 3.4 cm. 1.6 cm fluid density lesion within the right kidney likely represents simple renal cyst. The left kidney enhances homogeneously. 3 mm left nephrolithiasis.  No hydronephrosis. No hydroureter. The urinary bladder is unremarkable. On delayed imaging, there is no urothelial wall thickening and there are no filling defects in the opacified portions of the left collecting system. No excretion intravenous contrast on the right. Stomach/Bowel: Stomach is within normal limits. No evidence of bowel wall thickening or dilatation. Colonic diverticulosis. Appendix appears normal. Vascular/Lymphatic: No hyperdense vessel or unexpected calcification. Reproductive: Prostate is unremarkable. Other: No intraperitoneal free fluid. No intraperitoneal free gas. No organized fluid collection. Musculoskeletal: No abdominal wall hernia or abnormality. No suspicious lytic or blastic osseous lesions. No acute displaced fracture. L5-S1 intervertebral disc vacuum phenomenon. IMPRESSION: 1. Acute pancreatitis.  No pseudocyst. 2. Hepatic steatosis. 3. Diverticulosis with no acute diverticulitis. 4. Atrophic right kidney. Electronically Signed   By: MIven FinnM.D.   On: 01/23/2022 16:19    EKG: Independently  reviewed.  Sinus tachycardia with rate 124; no evidence of acute ischemia   Labs on Admission: I have personally reviewed the available labs and imaging studies at the time of the admission.  Pertinent labs:    Glucose 117 Lipase 156 AST 54/ALT 25 Lipase 1.8 Unremarkable CBC   Assessment and Plan: Principal Problem:   Acute on chronic pancreatitis (HCC) Active Problems:   Pancreatic divisum   Essential hypertension   Congenital single kidney   High cholesterol    Acute on chronic pancreatitis -Patient with prior h/o acute and chronic pancreatitis presenting with similar symptoms -C/w prior presentations - epigastric and LUQ pain with associated n/v/d -Now with frank pancreatitis by H&P, minimaly elevated lipase, and CT -Will admit to med surg  -Strict NPO for now -Aggressive IVF hydration at least for the first 12 hours with LR at 200 cc/hr -Pain control with morphine 2 mg q2h prn.   -Nausea control with Zofran -Scant LFT/lipase elevation -He has had an extensive prior evaluation with known h/o pancreas divisum and s/p ERCP with biliary  duct sphincterotomy and pancreatic duct placement at [facility] (removed on 7/20) -He takes pancreatic enzymes at home, will resume once eating again -Continue Neurontin -If not improving quickly, recommend GI consult   HTN -Continue telmisartan (irbesartan per formulary)   HLD -He does not appear to be taking medications for this issue at this time    Solitary kidney -Normal renal function currently -Use caution with nephrotoxic agents -Recheck BMP in AM      Advance Care Planning:   Code Status: Full Code   Consults: None  DVT Prophylaxis: SCDs  Family Communication: None present; he is capable of communicating with family at this time  Severity of Illness: The appropriate patient status for this patient is INPATIENT. Inpatient status is judged to be reasonable and necessary in order to provide the required intensity of  service to ensure the patient's safety. The patient's presenting symptoms, physical exam findings, and initial radiographic and laboratory data in the context of their chronic comorbidities is felt to place them at high risk for further clinical deterioration. Furthermore, it is not anticipated that the patient will be medically stable for discharge from the hospital within 2 midnights of admission.   * I certify that at the point of admission it is my clinical judgment that the patient will require inpatient hospital care spanning beyond 2 midnights from the point of admission due to high intensity of service, high risk for further deterioration and high frequency of surveillance required.*  Author: Karmen Bongo, MD 01/24/2022 9:29 AM  For on call review [email or web address].

## 2022-01-24 NOTE — ED Notes (Signed)
ED TO INPATIENT HANDOFF REPORT  ED Nurse Name and Phone #: Jhayla Podgorski RN [phone]  S Name/Age/Gender Kyle Berry 51 y.o. male Room/Bed: 039C/039C  Code Status   Code Status: Full Code  Home/SNF/Other Home Patient oriented to: self, place, time, and situation Is this baseline? Yes   Triage Complete: Triage complete  Chief Complaint Acute pancreatitis [K85.90]  Triage Note Reports hx of pancreatitis with flare up x 2 weeks and reports he sometimes can get it to go away at home but this has not gone away.  Reports vomiting.  Pancreatitis due to malfunction bilary duct.    Allergies Allergies  Allergen Reactions   Diclofenac Hives and Other (See Comments)    Fluid buildup in chest   Norvasc [Amlodipine Besylate] Other (See Comments)    Fluid buildup in chest   Omeprazole Other (See Comments)    MD stopped due to pancreatitis   Hydrochlorothiazide     07/31/2016 -  07/20/2016 -  05/28/2016 -   Other Reaction(s) from [place]: itching   Prednisone Hives and Other (See Comments)    Mood swings    Level of Care/Admitting Diagnosis ED Disposition     ED Disposition  Admit   Condition  --   [place]: [facility] [[id number]]  Level of Care: Med-Surg [16]  May admit patient to [facility] or [facility] if equivalent level of care is available:: Yes  Covid Evaluation: Asymptomatic - no recent exposure (last 10 days) testing not required  Diagnosis: Acute pancreatitis [577.0.ICD-9-CM]  Admitting Physician: Kayleen Memos [[id number]]  Attending Physician: Kayleen Memos [[id number]]  Certification:: I certify this patient will need inpatient services for at least 2 midnights  Estimated Length of Stay: 2          B Medical/Surgery History Past Medical History:  Diagnosis Date   Atrophy of right kidney    Chronic bronchitis ([place])    Chronic lower back pain    Chronic pancreatitis ([place])    Gastroparesis 06/2020   confirmed by  emptying study at [facility]   GERD (gastroesophageal reflux disease)    Headache    "once/month" (05/13/2017)   High cholesterol    Hypertension    Migraine    "a couple/year" (05/13/2017)   Nephrolithiasis 05/13/2017   Pancreatitis    Pneumonia ~ 09/2015   Presence of pancreatic duct stent    Recurrent acute pancreatitis    Shortness of breath 05/22/2021   Past Surgical History:  Procedure Laterality Date   BIOPSY  11/02/2020   Procedure: BIOPSY;  Surgeon: Irving Copas., MD;  Location: [place];  Service: Gastroenterology;;   CHOLECYSTECTOMY N/A 10/23/2017   Procedure: LAPAROSCOPIC CHOLECYSTECTOMY WITH INTRAOPERATIVE CHOLANGIOGRAM;  Surgeon: Johnathan Hausen, MD;  Location: [facility];  Service: General;  Laterality: N/A;   COLONOSCOPY WITH PROPOFOL N/A 11/02/2019   Procedure: COLONOSCOPY WITH PROPOFOL;  Surgeon: Irene Shipper, MD;  Location: [facility]COPY;  Service: Endoscopy;  Laterality: N/A;   ERCP N/A 11/02/2020   Procedure: ENDOSCOPIC RETROGRADE CHOLANGIOPANCREATOGRAPHY (ERCP);  Surgeon: Irving Copas., MD;  Location: [place];  Service: Gastroenterology;  Laterality: N/A;   ESOPHAGOGASTRODUODENOSCOPY N/A 11/02/2020   Procedure: ESOPHAGOGASTRODUODENOSCOPY (EGD);  Surgeon: Irving Copas., MD;  Location: [place];  Service: Gastroenterology;  Laterality: N/A;   ESOPHAGOGASTRODUODENOSCOPY (EGD) WITH PROPOFOL N/A 11/06/2019   Procedure: ESOPHAGOGASTRODUODENOSCOPY (EGD) WITH PROPOFOL;  Surgeon: Irene Shipper, MD;  Location: [facility] ENDOSCOPY;  Service: Endoscopy;  Laterality: N/A;  HEMOSTASIS CLIP PLACEMENT  11/02/2019   Procedure: HEMOSTASIS CLIP PLACEMENT;  Surgeon: Irene Shipper, MD;  Location: [facility] ENDOSCOPY;  Service: Endoscopy;;   NO PAST SURGERIES     REMOVAL OF STONES  11/02/2020   Procedure: REMOVAL OF STONES;  Surgeon: Irving Copas., MD;  Location: [place];  Service: Gastroenterology;;   Lavell Islam REMOVAL  11/02/2020   Procedure: STENT REMOVAL;   Surgeon: Irving Copas., MD;  Location: [place];  Service: Gastroenterology;;     A IV Location/Drains/Wounds Patient Lines/Drains/Airways Status     Active Line/Drains/Airways     Name Placement date Placement time Site Days   Peripheral IV 01/23/22 20 G Left;Posterior Forearm 01/23/22  1500  Forearm  1            Intake/Output Last 24 hours  Intake/Output Summary (Last 24 hours) at 01/24/2022 1652 Last data filed at 01/24/2022 4854 Gross per 24 hour  Intake 999 ml  Output --  Net 999 ml    Labs/Imaging Results for orders placed or performed during the hospital encounter of 01/23/22 (from the past 48 hour(s))  Urinalysis, Routine w reflex microscopic     Status: Abnormal   Collection Time: 01/23/22  7:30 AM  Result Value Ref Range   Color, Urine YELLOW YELLOW   APPearance CLEAR CLEAR   Specific Gravity, Urine 1.017 1.005 - 1.030   pH 5.0 5.0 - 8.0   Glucose, UA NEGATIVE NEGATIVE mg/dL   Hgb urine dipstick MODERATE (A) NEGATIVE   Bilirubin Urine NEGATIVE NEGATIVE   Ketones, ur 5 (A) NEGATIVE mg/dL   Protein, ur 30 (A) NEGATIVE mg/dL   Nitrite NEGATIVE NEGATIVE   Leukocytes,Ua NEGATIVE NEGATIVE   RBC / HPF 6-10 0 - 5 RBC/hpf   WBC, UA 0-5 0 - 5 WBC/hpf   Bacteria, UA RARE (A) NONE SEEN   Squamous Epithelial / LPF 0-5 0 - 5   Mucus PRESENT     Comment: Performed at [facility], [place]. [address], [place], [place] [address]  Lactic acid, plasma     Status: None   Collection Time: 01/23/22  2:31 PM  Result Value Ref Range   Lactic Acid, Venous 1.8 0.5 - 1.9 mmol/L    Comment: Performed at [facility] [address], [place], [place] [address]  CBC     Status: Abnormal   Collection Time: 01/23/22  2:47 PM  Result Value Ref Range   WBC 7.5 4.0 - 10.5 K/uL   RBC 3.84 (L) 4.22 - 5.81 MIL/uL   Hemoglobin 13.5 13.0 - 17.0 g/dL   HCT 40.5 39.0 - 52.0 %   MCV 105.5 (H) 80.0 - 100.0 fL   MCH 35.2 (H) 26.0 - 34.0 pg   MCHC 33.3 30.0 - 36.0  g/dL   RDW 14.5 11.5 - 15.5 %   Platelets 161 150 - 400 K/uL   nRBC 0.0 0.0 - 0.2 %    Comment: Performed at [facility], [place] [address] [place], [place] [address]  Comprehensive metabolic panel     Status: Abnormal   Collection Time: 01/23/22  4:45 PM  Result Value Ref Range   Sodium 138 135 - 145 mmol/L   Potassium 3.8 3.5 - 5.1 mmol/L   Chloride 106 98 - 111 mmol/L   CO2 24 22 - 32 mmol/L   Glucose, Bld 117 (H) 70 - 99 mg/dL    Comment: Glucose reference range applies only to samples taken after fasting  for at least 8 hours.   BUN <5 (L) 6 - 20 mg/dL   Creatinine, Ser 1.17 0.61 - 1.24 mg/dL   Calcium 8.7 (L) 8.9 - 10.3 mg/dL   Total Protein 6.7 6.5 - 8.1 g/dL   Albumin 3.5 3.5 - 5.0 g/dL   AST 54 (H) 15 - 41 U/L   ALT 25 0 - 44 U/L   Alkaline Phosphatase 68 38 - 126 U/L   Total Bilirubin 1.1 0.3 - 1.2 mg/dL   GFR, Estimated >60 >60 mL/min    Comment: (NOTE) Calculated using the CKD-EPI Creatinine Equation (2021)    Anion gap 8 5 - 15    Comment: Performed at [facility], [place] [address], [place], [place] [address]  Lipase, blood     Status: Abnormal   Collection Time: 01/23/22  4:45 PM  Result Value Ref Range   Lipase 156 (H) 11 - 51 U/L    Comment: Performed at [facility], [place] [address] [place], [place] [address]   CT ABDOMEN PELVIS W CONTRAST  Result Date: 01/23/2022 CLINICAL DATA:  Pancreatitis, acute, severe EXAM: CT ABDOMEN AND PELVIS WITH CONTRAST TECHNIQUE: Multidetector CT imaging of the abdomen and pelvis was performed using the standard protocol following bolus administration of intravenous contrast. RADIATION DOSE REDUCTION: This exam was performed according to the departmental dose-optimization program which includes automated exposure control, adjustment of the mA and/or kV according to patient size and/or use of iterative reconstruction technique. CONTRAST:  82m OMNIPAQUE IOHEXOL 350 MG/ML SOLN COMPARISON:  None Available. FINDINGS:  Lower chest: No acute abnormality. Hepatobiliary: The hepatic parenchyma is diffusely hypodense compared to the splenic parenchyma consistent with fatty infiltration. No focal liver abnormality. Status post cholecystectomy. No biliary dilatation. Pancreas: Nonspecific punctate calcification along uncinate process of pancreas. No focal lesion. Mild fat stranding along the proximal pancreas. The pancreatic parenchyma enhances normally. No main pancreatic ductal dilatation. Spleen: Normal in size without focal abnormality. Adrenals/Urinary Tract: No adrenal nodule bilaterally. Atrophic right kidney measuring 3.4 cm. 1.6 cm fluid density lesion within the right kidney likely represents simple renal cyst. The left kidney enhances homogeneously. 3 mm left nephrolithiasis.  No hydronephrosis. No hydroureter. The urinary bladder is unremarkable. On delayed imaging, there is no urothelial wall thickening and there are no filling defects in the opacified portions of the left collecting system. No excretion intravenous contrast on the right. Stomach/Bowel: Stomach is within normal limits. No evidence of bowel wall thickening or dilatation. Colonic diverticulosis. Appendix appears normal. Vascular/Lymphatic: No hyperdense vessel or unexpected calcification. Reproductive: Prostate is unremarkable. Other: No intraperitoneal free fluid. No intraperitoneal free gas. No organized fluid collection. Musculoskeletal: No abdominal wall hernia or abnormality. No suspicious lytic or blastic osseous lesions. No acute displaced fracture. L5-S1 intervertebral disc vacuum phenomenon. IMPRESSION: 1. Acute pancreatitis.  No pseudocyst. 2. Hepatic steatosis. 3. Diverticulosis with no acute diverticulitis. 4. Atrophic right kidney. Electronically Signed   By: MIven FinnM.D.   On: 01/23/2022 16:19    Pending Labs Unresulted Labs (From admission, onward)     Start     Ordered   01/25/22 0500  Comprehensive metabolic panel  Tomorrow  morning,   R        01/24/22 0915   01/25/22 0500  CBC  Tomorrow morning,   R        01/24/22 0915            Vitals/Pain Today's Vitals   01/24/22 1048 01/24/22 1439 01/24/22 1455  01/24/22 1631  BP:  (!) 176/101    Pulse:  81    Resp:  20    Temp:  97.9 F (36.6 C)    TempSrc:  Oral    SpO2:  94%    Weight:      Height:      PainSc: 10-Worst pain ever  10-Worst pain ever 10-Worst pain ever    Isolation Precautions No active isolations  Medications Medications  irbesartan (AVAPRO) tablet 150 mg (150 mg Oral Given 01/24/22 1129)  gabapentin (NEURONTIN) capsule 100 mg (100 mg Oral Given 01/24/22 1129)  lactated ringers infusion ( Intravenous New Bag/Given 01/24/22 1030)  acetaminophen (TYLENOL) tablet 650 mg (has no administration in time range)    Or  acetaminophen (TYLENOL) suppository 650 mg (has no administration in time range)  ondansetron (ZOFRAN) tablet 4 mg ( Oral See Alternative 01/24/22 1022)    Or  ondansetron (ZOFRAN) injection 4 mg (4 mg Intravenous Given 01/24/22 1022)  hydrALAZINE (APRESOLINE) injection 5 mg (has no administration in time range)  morphine (PF) 4 MG/ML injection 4 mg (4 mg Intravenous Given 01/24/22 1456)  ondansetron (ZOFRAN) injection 4 mg (4 mg Intravenous Given 01/23/22 1512)  morphine (PF) 2 MG/ML injection 4 mg (4 mg Intravenous Given 01/23/22 1513)  iohexol (OMNIPAQUE) 350 MG/ML injection 75 mL (75 mLs Intravenous Contrast Given 01/23/22 1605)  ondansetron (ZOFRAN-ODT) disintegrating tablet 4 mg (4 mg Oral Given 01/23/22 1702)  oxyCODONE-acetaminophen (PERCOCET/ROXICET) 5-325 MG per tablet 1 tablet (1 tablet Oral Given 01/23/22 1702)  HYDROmorphone (DILAUDID) injection 1 mg (1 mg Intravenous Given 01/24/22 0410)  sodium chloride 0.9 % bolus 1,000 mL (0 mLs Intravenous Stopped 01/24/22 0512)  metoCLOPramide (REGLAN) injection 10 mg (10 mg Intravenous Given 01/24/22 0410)  HYDROmorphone (DILAUDID) injection 1 mg (1 mg Intravenous  Given 01/24/22 0551)    Mobility walks Low fall risk   Focused Assessments Cardiac Assessment Handoff:  Cardiac Rhythm: Normal sinus rhythm Lab Results  Component Value Date   CKTOTAL 83 10/25/2021   TROPONINI <0.03 10/13/2017   Lab Results  Component Value Date   DDIMER 0.41 12/23/2020   Does the Patient currently have chest pain? No   , Pulmonary Assessment Handoff:  Lung sounds:   O2 Device: Room Air      R Recommendations: See Admitting Provider Note  Report given to:   Additional Notes:

## 2022-01-24 NOTE — ED Provider Notes (Signed)
[facility] EMERGENCY DEPARTMENT Provider Note   CSN: [id number] Arrival date & time: 01/23/22  1359     History  Chief Complaint  Patient presents with   Abdominal Pain    Kyle Berry is a 51 y.o. male.  HPI   Patient with medical history including pancreatic divisum status post cholecystectomy, presents with complaints of epigastric tenderness.  Patient has a started about 2 weeks ago, states pain is in his epigastric region does not radiate has remained constant, associated nausea vomiting denies hemesis or coffee-ground emesis last bowel movement was today, denies melena hematochezia, no urinary symptoms, states he has had subjective fevers and chills no cough no congestion no general body aches.  Patient states that he has had this pain in the past states feels like his typical flareup of his pancreatitis she is unable to control at home denies recent alcohol use.  Reviewed patient's chart been seen in the past for similar presentations, he is followed by GI at [facility] they obtains MRI of abdomen with MRCP revealing pancreatic duct and common bile duct inserted at the minor papilla no ductal dilation.  Home Medications Prior to Admission medications   Medication Sig Start Date End Date Taking? Authorizing Provider  famotidine (PEPCID) 20 MG tablet Take 1 tablet (20 mg total) by mouth 2 (two) times daily. 09/26/21 03/25/22  Barb Merino, MD  feeding supplement (ENSURE ENLIVE / ENSURE PLUS) LIQD Take 237 mLs by mouth 2 (two) times daily between meals. Patient taking differently: Take 237 mLs by mouth See admin instructions. Drink 237 ml by mouth 2-3 times a day between meals 02/04/21   Dahal, Marlowe Aschoff, MD  gabapentin (NEURONTIN) 100 MG capsule Take 1 capsule (100 mg total) by mouth 3 (three) times daily. 10/27/21   Lavina Hamman, MD  metoCLOPramide (REGLAN) 5 MG tablet Take 1 tablet (5 mg total) by mouth 4 (four) times daily -  before meals and at bedtime.  10/27/21   Lavina Hamman, MD  Naphazoline-Pheniramine (VISINE-A OP) Place 2 drops into both eyes daily.    [provider]  ondansetron (ZOFRAN-ODT) 4 MG disintegrating tablet Take 1 tablet (4 mg total) by mouth every 8 (eight) hours as needed for nausea or vomiting. 12/24/21   Tedd Sias, PA  oxyCODONE-acetaminophen (PERCOCET/ROXICET) 5-325 MG tablet Take 1 tablet by mouth every 6 (six) hours as needed for severe pain. 12/24/21   Tedd Sias, PA  potassium chloride SA (KLOR-CON M) 20 MEQ tablet Take 20 mEq by mouth daily. 09/07/21   [provider]  telmisartan (MICARDIS) 40 MG tablet Take 1 tablet (40 mg total) by mouth daily. 04/26/21 05/26/21  Idamae Schuller, MD      Allergies    Diclofenac, Norvasc [amlodipine besylate], Omeprazole, Hydrochlorothiazide, and Prednisone    Review of Systems   Review of Systems  Constitutional:  Negative for chills and fever.  Respiratory:  Negative for shortness of breath.   Cardiovascular:  Negative for chest pain.  Gastrointestinal:  Positive for abdominal pain, nausea and vomiting.  Neurological:  Negative for headaches.    Physical Exam Updated Vital Signs BP (!) 171/110   Pulse (!) 101   Temp 97.6 F (36.4 C) (Oral)   Resp 19   Ht _0  (1.854 m)   Wt 73 kg   SpO2 100%   BMI 21.24 kg/m  Physical Exam Vitals and nursing note reviewed.  Constitutional:      General: He is in acute distress.  Appearance: He is not ill-appearing.     Comments: Patient is hemodynamically stable during my assessment but appear to be in acute distress.  HENT:     Head: Normocephalic and atraumatic.     Nose: No congestion.  Eyes:     Conjunctiva/sclera: Conjunctivae normal.  Cardiovascular:     Rate and Rhythm: Regular rhythm. Tachycardia present.     Pulses: Normal pulses.  Pulmonary:     Effort: Pulmonary effort is normal.  Abdominal:     Palpations: Abdomen is soft.     Tenderness: There is abdominal tenderness. There is  no right CVA tenderness or left CVA tenderness.     Comments: Abdomen nondistended, soft, tenderness in the epigastric and right upper quadrant, without guarding rebound tenderness or peritoneal sign negative Murphy sign McBurney point.  Musculoskeletal:     Right lower leg: No edema.     Left lower leg: No edema.  Skin:    General: Skin is warm and dry.  Neurological:     Mental Status: He is alert.  Psychiatric:        Mood and Affect: Mood normal.     ED Results / Procedures / Treatments   Labs (all labs ordered are listed, but only abnormal results are displayed) Labs Reviewed  CBC - Abnormal; Notable for the following components:      Result Value   RBC 3.84 (*)    MCV 105.5 (*)    MCH 35.2 (*)    All other components within normal limits  COMPREHENSIVE METABOLIC PANEL - Abnormal; Notable for the following components:   Glucose, Bld 117 (*)    BUN <5 (*)    Calcium 8.7 (*)    AST 54 (*)    All other components within normal limits  LIPASE, BLOOD - Abnormal; Notable for the following components:   Lipase 156 (*)    All other components within normal limits  LACTIC ACID, PLASMA  URINALYSIS, ROUTINE W REFLEX MICROSCOPIC    EKG EKG Interpretation  Date/Time:  Tuesday January 23 2022 14:26:09 EDT Ventricular Rate:  124 PR Interval:  146 QRS Duration: 70 QT Interval:  312 QTC Calculation: 448 R Axis:   47 Text Interpretation: Sinus tachycardia Minimal voltage criteria for LVH, may be normal variant ( Sokolow-Lyon ) Borderline ECG Confirmed by Quintella Reichert [phone]) on 01/24/2022 3:45:23 AM  Radiology CT ABDOMEN PELVIS W CONTRAST  Result Date: 01/23/2022 CLINICAL DATA:  Pancreatitis, acute, severe EXAM: CT ABDOMEN AND PELVIS WITH CONTRAST TECHNIQUE: Multidetector CT imaging of the abdomen and pelvis was performed using the standard protocol following bolus administration of intravenous contrast. RADIATION DOSE REDUCTION: This exam was performed according to the  departmental dose-optimization program which includes automated exposure control, adjustment of the mA and/or kV according to patient size and/or use of iterative reconstruction technique. CONTRAST:  23m OMNIPAQUE IOHEXOL 350 MG/ML SOLN COMPARISON:  None Available. FINDINGS: Lower chest: No acute abnormality. Hepatobiliary: The hepatic parenchyma is diffusely hypodense compared to the splenic parenchyma consistent with fatty infiltration. No focal liver abnormality. Status post cholecystectomy. No biliary dilatation. Pancreas: Nonspecific punctate calcification along uncinate process of pancreas. No focal lesion. Mild fat stranding along the proximal pancreas. The pancreatic parenchyma enhances normally. No main pancreatic ductal dilatation. Spleen: Normal in size without focal abnormality. Adrenals/Urinary Tract: No adrenal nodule bilaterally. Atrophic right kidney measuring 3.4 cm. 1.6 cm fluid density lesion within the right kidney likely represents simple renal cyst. The left kidney enhances homogeneously. 3 mm left  nephrolithiasis.  No hydronephrosis. No hydroureter. The urinary bladder is unremarkable. On delayed imaging, there is no urothelial wall thickening and there are no filling defects in the opacified portions of the left collecting system. No excretion intravenous contrast on the right. Stomach/Bowel: Stomach is within normal limits. No evidence of bowel wall thickening or dilatation. Colonic diverticulosis. Appendix appears normal. Vascular/Lymphatic: No hyperdense vessel or unexpected calcification. Reproductive: Prostate is unremarkable. Other: No intraperitoneal free fluid. No intraperitoneal free gas. No organized fluid collection. Musculoskeletal: No abdominal wall hernia or abnormality. No suspicious lytic or blastic osseous lesions. No acute displaced fracture. L5-S1 intervertebral disc vacuum phenomenon. IMPRESSION: 1. Acute pancreatitis.  No pseudocyst. 2. Hepatic steatosis. 3.  Diverticulosis with no acute diverticulitis. 4. Atrophic right kidney. Electronically Signed   By: Iven Finn M.D.   On: 01/23/2022 16:19    Procedures Procedures    Medications Ordered in ED Medications  ondansetron (ZOFRAN) injection 4 mg (4 mg Intravenous Given 01/23/22 1512)  morphine (PF) 2 MG/ML injection 4 mg (4 mg Intravenous Given 01/23/22 1513)  iohexol (OMNIPAQUE) 350 MG/ML injection 75 mL (75 mLs Intravenous Contrast Given 01/23/22 1605)  ondansetron (ZOFRAN-ODT) disintegrating tablet 4 mg (4 mg Oral Given 01/23/22 1702)  oxyCODONE-acetaminophen (PERCOCET/ROXICET) 5-325 MG per tablet 1 tablet (1 tablet Oral Given 01/23/22 1702)  HYDROmorphone (DILAUDID) injection 1 mg (1 mg Intravenous Given 01/24/22 0410)  sodium chloride 0.9 % bolus 1,000 mL (0 mLs Intravenous Stopped 01/24/22 0512)  metoCLOPramide (REGLAN) injection 10 mg (10 mg Intravenous Given 01/24/22 0410)  HYDROmorphone (DILAUDID) injection 1 mg (1 mg Intravenous Given 01/24/22 0551)    ED Course/ Medical Decision Making/ A&P                           Medical Decision Making Amount and/or Complexity of Data Reviewed Labs: ordered.  Risk Prescription drug management. Decision regarding hospitalization.   This patient presents to the ED for concern of epigastric pain, this involves an extensive number of treatment options, and is a complaint that carries with it a high risk of complications and morbidity.  The differential diagnosis includes pancreatitis, diverticulitis, cholangitis, bowel obstruction dissection    Additional history obtained:  Additional history obtained from N/A External records from outside source obtained and reviewed including GI notes   Co morbidities that complicate the patient evaluation  Chronic pancreatitis  Social Determinants of Health:  N/A    Lab Tests:  I Ordered, and personally interpreted labs.  The pertinent results include: CBC is unremarkable, CMP shows  glucose of 117 calcium 8.7 AST 54 lipase 156 lactic 1.8   Imaging Studies ordered:  I ordered imaging studies including CT abdomen pelvis I independently visualized and interpreted imaging which showed shows acute pancreatitis I agree with the radiologist interpretation   Cardiac Monitoring:  The patient was maintained on a cardiac monitor.  I personally viewed and interpreted the cardiac monitored which showed an underlying rhythm of: Sinus tach without signs of ischemia   Medicines ordered and prescription drug management:  I ordered medication including foods, antiemetics, pain medication I have reviewed the patients home medicines and have made adjustments as needed  Critical Interventions:  N/A   Reevaluation:  Presents with abdominal pain, triage obtain lab work imaging which I personally reviewed consistent with my physical exam of pancreatitis, will provide him with pain medication antiemetics fluids and reassess.  I reassessed the patient he still endorsing stomach pain, he remains tachycardic, I  will recommend admission for continued pain management of his pancreatitis he is agreement this plan    Consultations Obtained:  I requested consultation with the Dr. Nevada Crane,  and discussed lab and imaging findings as well as pertinent plan - they recommend: We will admit the patient    Test Considered:  N/A    Rule out  I have low suspicion for liver or gallbladder abnormality as she has no right upper quadrant tenderness, liver enzymes, alk phos, T bili all within normal limits.  I have low suspicion for necrotizing pancreatitis lactic is within normal limits.  Low suspicion for ruptured stomach ulcer as she has no peritoneal sign present on exam.  I have low suspicion for bowel obstruction, volvulus, diverticulitis, pancreatitis, AAA as CT imaging is all negative these findings.  My suspicion for dissection is also low at this time as presentation is atypical etiology  he has low risk factors, presentation seems more consistent with pancreatitis as seen on CT imaging.    Dispostion and problem list  After consideration of the diagnostic results and the patients response to treatment, I feel that the patent would benefit from admission.  Acute chronic pancreatitis-patient will need continued monitoring and pain management for pancreatitis.            Final Clinical Impression(s) / ED Diagnoses Final diagnoses:  Acute pancreatitis without infection or necrosis, unspecified pancreatitis type    Rx / DC Orders ED Discharge Orders     None         Aron Baba 01/24/22 6060    Quintella Reichert, MD 01/29/22 1905

## 2022-01-24 NOTE — Plan of Care (Addendum)
Patient will remain NPO. Patient is A&O x 4 and able to make his needs known. States this is a chronic condition and he has been in the hospital a couple times this year. Patient is independent with all ADLs.

## 2022-01-25 DIAGNOSIS — K859 Acute pancreatitis without necrosis or infection, unspecified: Secondary | ICD-10-CM | POA: Diagnosis not present

## 2022-01-25 DIAGNOSIS — K861 Other chronic pancreatitis: Secondary | ICD-10-CM | POA: Diagnosis not present

## 2022-01-25 LAB — CBC
HCT: 28 % — ABNORMAL LOW (ref 39.0–52.0)
Hemoglobin: 9.8 g/dL — ABNORMAL LOW (ref 13.0–17.0)
MCH: 35.8 pg — ABNORMAL HIGH (ref 26.0–34.0)
MCHC: 35 g/dL (ref 30.0–36.0)
MCV: 102.2 fL — ABNORMAL HIGH (ref 80.0–100.0)
Platelets: 109 10*3/uL — ABNORMAL LOW (ref 150–400)
RBC: 2.74 MIL/uL — ABNORMAL LOW (ref 4.22–5.81)
RDW: 14.1 % (ref 11.5–15.5)
WBC: 7.2 10*3/uL (ref 4.0–10.5)
nRBC: 0 % (ref 0.0–0.2)

## 2022-01-25 LAB — COMPREHENSIVE METABOLIC PANEL
ALT: 13 U/L (ref 0–44)
AST: 19 U/L (ref 15–41)
Albumin: 2 g/dL — ABNORMAL LOW (ref 3.5–5.0)
Alkaline Phosphatase: 37 U/L — ABNORMAL LOW (ref 38–126)
Anion gap: 15 (ref 5–15)
BUN: <5 mg/dL — ABNORMAL LOW (ref 6–20)
CO2: 20 mmol/L — ABNORMAL LOW (ref 22–32)
Calcium: 8.3 mg/dL — ABNORMAL LOW (ref 8.9–10.3)
Chloride: 100 mmol/L (ref 98–111)
Creatinine, Ser: 0.69 mg/dL (ref 0.61–1.24)
GFR, Estimated: >60 mL/min (ref >60)
Glucose, Bld: 82 mg/dL (ref 70–99)
Potassium: 3.9 mmol/L (ref 3.5–5.1)
Sodium: 135 mmol/L (ref 135–145)
Total Bilirubin: 0.9 mg/dL (ref 0.3–1.2)
Total Protein: 4.1 g/dL — ABNORMAL LOW (ref 6.5–8.1)

## 2022-01-25 NOTE — Progress Notes (Signed)
PROGRESS NOTE    Kyle Berry  FBP:[id number] DOB: 1970-12-07 DOA: 01/23/2022 PCP: [facility]    Brief Narrative:  Kyle Berry is a 51 y.o. male with medical history significant of chronic pancreatitis with h/o stent placement, pancreatic divisum; HTN; HLD; and solitary kidney presenting with abdominal pain, vomiting.   He had an MRI on 9/19 with normal appearing ducts. He reports having abdominal pain for about 2 weeks with n/v/d.  He knew it was his pancreatitis acting up.  The medication wasn't working - oxy/Percocet, phenergan.  He has vomited maybe 3-4 times overnight.    Assessment and Plan: Acute on chronic pancreatitis -Patient with prior h/o acute and chronic pancreatitis presenting with similar symptoms -C/w prior presentations - epigastric and LUQ pain with associated n/v/d -Now with frank pancreatitis by H&P, minimaly elevated lipase, and CT -Strict NPO for now -Aggressive IVF hydration -Pain control with morphine 2 mg q2h prn.   -Nausea control with Zofran -Scant LFT/lipase elevation -He has had an extensive prior evaluation with known h/o pancreas divisum and s/p ERCP with biliary duct sphincterotomy and pancreatic duct placement at [facility] (removed on 7/20) -He takes pancreatic enzymes at home, will resume once eating again -Continue Neurontin -monitor for improvement-- still very sore with movement   HTN -Continue telmisartan (irbesartan per formulary)   HLD -He does not appear to be taking medications for this issue at this time    Solitary kidney -Normal renal function currently -Use caution with nephrotoxic agents -daily labs   Anemia -unclear etiology, no sign of bleeding -monitor with daily labs    DVT prophylaxis: SCDs Start: 01/24/22 0916    Code Status: Full Code   Disposition Plan:  Level of care: Med-Surg Status is: Inpatient Remains inpatient appropriate because: needs bowel rest/IVF     Consultants:  none   Subjective: Pain with movement  Objective: Vitals:   01/24/22 1721 01/24/22 1722 01/24/22 1944 01/25/22 0320  BP: (!) 168/104  (!) 168/102 (!) 147/89  Pulse: 77 92 80 79  Resp:    18  Temp:   98.1 F (36.7 C) 99.3 F (37.4 C)  TempSrc:   Oral Oral  SpO2: 98% 98% 98% 98%  Weight:      Height:        Intake/Output Summary (Last 24 hours) at 01/25/2022 0726 Last data filed at 01/25/2022 8242 Gross per 24 hour  Intake 3432.11 ml  Output 500 ml  Net 2932.11 ml   Filed Weights   01/23/22 1424  Weight: 73 kg    Examination:   General: Appearance:    Well developed, well nourished male in no acute distress     Lungs:      respirations unlabored  Heart:    Normal heart rate.   MS:   All extremities are intact.    Neurologic:   Awake, alert       Data Reviewed: I have personally reviewed following labs and imaging studies  CBC: Recent Labs  Lab 01/23/22 1447 01/25/22 0406  WBC 7.5 7.2  HGB 13.5 9.8*  HCT 40.5 28.0*  MCV 105.5* 102.2*  PLT 161 353*   Basic Metabolic Panel: Recent Labs  Lab 01/23/22 1645 01/25/22 0406  NA 138 135  K 3.8 3.9  CL 106 100  CO2 24 20*  GLUCOSE 117* 82  BUN <5* <5*  CREATININE 1.17 0.69  CALCIUM 8.7* 8.3*   GFR: Estimated Creatinine Clearance: 111.5 mL/min (by C-G formula  based on SCr of 0.69 mg/dL). Liver Function Tests: Recent Labs  Lab 01/23/22 1645 01/25/22 0406  AST 54* 19  ALT 25 13  ALKPHOS 68 37*  BILITOT 1.1 0.9  PROT 6.7 4.1*  ALBUMIN 3.5 2.0*   Recent Labs  Lab 01/23/22 1645  LIPASE 156*   No results for input(s): "AMMONIA" in the last 168 hours. Coagulation Profile: No results for input(s): "INR", "PROTIME" in the last 168 hours. Cardiac Enzymes: No results for input(s): "CKTOTAL", "CKMB", "CKMBINDEX", "TROPONINI" in the last 168 hours. BNP (last 3 results) No results for input(s): "PROBNP" in the last 8760 hours. HbA1C: No results for input(s): "HGBA1C" in the  last 72 hours. CBG: No results for input(s): "GLUCAP" in the last 168 hours. Lipid Profile: No results for input(s): "CHOL", "HDL", "LDLCALC", "TRIG", "CHOLHDL", "LDLDIRECT" in the last 72 hours. Thyroid Function Tests: No results for input(s): "TSH", "T4TOTAL", "FREET4", "T3FREE", "THYROIDAB" in the last 72 hours. Anemia Panel: No results for input(s): "VITAMINB12", "FOLATE", "FERRITIN", "TIBC", "IRON", "RETICCTPCT" in the last 72 hours. Sepsis Labs: Recent Labs  Lab 01/23/22 1431  LATICACIDVEN 1.8    No results found for this or any previous visit (from the past 240 hour(s)).       Radiology Studies: CT ABDOMEN PELVIS W CONTRAST  Result Date: 01/23/2022 CLINICAL DATA:  Pancreatitis, acute, severe EXAM: CT ABDOMEN AND PELVIS WITH CONTRAST TECHNIQUE: Multidetector CT imaging of the abdomen and pelvis was performed using the standard protocol following bolus administration of intravenous contrast. RADIATION DOSE REDUCTION: This exam was performed according to the departmental dose-optimization program which includes automated exposure control, adjustment of the mA and/or kV according to patient size and/or use of iterative reconstruction technique. CONTRAST:  52m OMNIPAQUE IOHEXOL 350 MG/ML SOLN COMPARISON:  None Available. FINDINGS: Lower chest: No acute abnormality. Hepatobiliary: The hepatic parenchyma is diffusely hypodense compared to the splenic parenchyma consistent with fatty infiltration. No focal liver abnormality. Status post cholecystectomy. No biliary dilatation. Pancreas: Nonspecific punctate calcification along uncinate process of pancreas. No focal lesion. Mild fat stranding along the proximal pancreas. The pancreatic parenchyma enhances normally. No main pancreatic ductal dilatation. Spleen: Normal in size without focal abnormality. Adrenals/Urinary Tract: No adrenal nodule bilaterally. Atrophic right kidney measuring 3.4 cm. 1.6 cm fluid density lesion within the right  kidney likely represents simple renal cyst. The left kidney enhances homogeneously. 3 mm left nephrolithiasis.  No hydronephrosis. No hydroureter. The urinary bladder is unremarkable. On delayed imaging, there is no urothelial wall thickening and there are no filling defects in the opacified portions of the left collecting system. No excretion intravenous contrast on the right. Stomach/Bowel: Stomach is within normal limits. No evidence of bowel wall thickening or dilatation. Colonic diverticulosis. Appendix appears normal. Vascular/Lymphatic: No hyperdense vessel or unexpected calcification. Reproductive: Prostate is unremarkable. Other: No intraperitoneal free fluid. No intraperitoneal free gas. No organized fluid collection. Musculoskeletal: No abdominal wall hernia or abnormality. No suspicious lytic or blastic osseous lesions. No acute displaced fracture. L5-S1 intervertebral disc vacuum phenomenon. IMPRESSION: 1. Acute pancreatitis.  No pseudocyst. 2. Hepatic steatosis. 3. Diverticulosis with no acute diverticulitis. 4. Atrophic right kidney. Electronically Signed   By: MIven FinnM.D.   On: 01/23/2022 16:19        Scheduled Meds:  gabapentin  100 mg Oral TID   irbesartan  150 mg Oral Daily   Continuous Infusions:  lactated ringers 200 mL/hr at 01/25/22 0635     LOS: 1 day    Time spent: 45 minutes  spent on chart review, discussion with nursing staff, consultants, updating family and interview/physical exam; more than 50% of that time was spent in counseling and/or coordination of care.    Geradine Girt, DO [facility] Available via Epic secure chat 7am-7pm After these hours, please refer to coverage provider listed on [email or web address] 01/25/2022, 7:26 AM

## 2022-01-26 LAB — CBC
HCT: 35.6 % — ABNORMAL LOW (ref 39.0–52.0)
Hemoglobin: 12.1 g/dL — ABNORMAL LOW (ref 13.0–17.0)
MCH: 34.8 pg — ABNORMAL HIGH (ref 26.0–34.0)
MCHC: 34 g/dL (ref 30.0–36.0)
MCV: 102.3 fL — ABNORMAL HIGH (ref 80.0–100.0)
Platelets: 145 10*3/uL — ABNORMAL LOW (ref 150–400)
RBC: 3.48 MIL/uL — ABNORMAL LOW (ref 4.22–5.81)
RDW: 14 % (ref 11.5–15.5)
WBC: 6.6 10*3/uL (ref 4.0–10.5)
nRBC: 0 % (ref 0.0–0.2)

## 2022-01-26 LAB — COMPREHENSIVE METABOLIC PANEL
ALT: 22 U/L (ref 0–44)
AST: 38 U/L (ref 15–41)
Albumin: 3 g/dL — ABNORMAL LOW (ref 3.5–5.0)
Alkaline Phosphatase: 53 U/L (ref 38–126)
Anion gap: 8 (ref 5–15)
BUN: 5 mg/dL — ABNORMAL LOW (ref 6–20)
CO2: 26 mmol/L (ref 22–32)
Calcium: 8.8 mg/dL — ABNORMAL LOW (ref 8.9–10.3)
Chloride: 101 mmol/L (ref 98–111)
Creatinine, Ser: 1.05 mg/dL (ref 0.61–1.24)
GFR, Estimated: >60 mL/min (ref >60)
Glucose, Bld: 95 mg/dL (ref 70–99)
Potassium: 3.7 mmol/L (ref 3.5–5.1)
Sodium: 135 mmol/L (ref 135–145)
Total Bilirubin: 1.1 mg/dL (ref 0.3–1.2)
Total Protein: 6.1 g/dL — ABNORMAL LOW (ref 6.5–8.1)

## 2022-01-26 MED ORDER — HYDROCODONE-ACETAMINOPHEN 5-325 MG PO TABS
1.0000 | ORAL_TABLET | ORAL | Status: DC | PRN
  Administered 2022-01-26: 1 via ORAL
  Filled 2022-01-26: qty 1

## 2022-01-26 NOTE — TOC Initial Note (Signed)
Transition of Care [facility]) - Initial/Assessment Note    Patient Details  Name: Kyle Berry MRN: [id number] Date of Birth: 06-Apr-1971  Transition of Care [facility]) CM/SW Contact:    Curlene Labrum, RN Phone Number: 01/26/2022, 2:02 PM  Clinical Narrative:                     Transition of Care [facility]) Screening Note   Patient Details  Name: Kyle Berry Date of Birth: [birth year]   Transition of Care [facility]) CM/SW Contact:    Curlene Labrum, RN Phone Number: 01/26/2022, 2:02 PM    Transition of Care Department [facility]) has reviewed patient and no TOC needs have been identified at this time. We will continue to monitor patient advancement through interdisciplinary progression rounds. If new patient transition needs arise, please place a TOC consult.       Patient Goals and CMS Choice        Expected Discharge Plan and Services                                                Prior Living Arrangements/Services                       Activities of Daily Living Home Assistive Devices/Equipment: None ADL Screening (condition at time of admission) Patient's cognitive ability adequate to safely complete daily activities?: Yes Is the patient deaf or have difficulty hearing?: No Does the patient have difficulty seeing, even when wearing glasses/contacts?: No Does the patient have difficulty concentrating, remembering, or making decisions?: No Patient able to express need for assistance with ADLs?: No Does the patient have difficulty dressing or bathing?: No Independently performs ADLs?: No Communication: Independent Dressing (OT): Independent Grooming: Independent Feeding: Independent Bathing: Independent Toileting: Independent In/Out Bed: Independent Walks in Home: Independent Does the patient have difficulty walking or climbing stairs?: No Weakness of Legs: None Weakness of Arms/Hands: None  Permission Sought/Granted                   Emotional Assessment              Admission diagnosis:  Acute pancreatitis [K85.90] Acute pancreatitis without infection or necrosis, unspecified pancreatitis type [K85.90] Patient Active Problem List   Diagnosis Date Noted   Nausea and vomiting 10/25/2021   Pancreatitis, acute 09/20/2021   Nausea, vomiting, and diarrhea 09/19/2021   Hematochezia 09/19/2021   Colonic ulcer    Pancreatic insufficiency 05/28/2019   Pancreatic divisum    Hematemesis 10/18/2017   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 [place] [address] Phone: [phone] Fax: [phone]  [facility] [address] [place] [address] Phone: [phone] Fax: [place] [address] Phone: [phone] Fax: [phone]     Social Determinants of Health (SDOH) Interventions Housing Interventions: Intervention Not Indicated  Readmission Risk Interventions    11/03/2020  2:19 PM  Readmission Risk Prevention Plan  Transportation Screening Complete  PCP or Specialist Appt within 3-5 Days Complete  HRI or Home Care Consult Complete  Social Work Consult for [place] Planning/Counseling Complete  Palliative Care Screening Not Applicable

## 2022-01-26 NOTE — Discharge Summary (Signed)
Physician Discharge Summary  Kyle Berry WRU:[id number] DOB: December 12, 1970 DOA: 01/23/2022  PCP: [place] date: 01/23/2022 Discharge date: 01/26/2022 Recommendations for Outpatient Follow-up:  Follow up with PCP in 1 weeks-call for appointment Please obtain BMP/CBC in one week  Discharge Dispo: Home Discharge Condition: Stable Code Status:   Code Status: Prior Diet recommendation:  Diet Order     None        Brief/Interim Summary: 51 y.o. male with medical history significant of chronic pancreatitis with h/o stent placement, pancreatic divisum; HTN; HLD; and solitary kidney presenting with abdominal pain, vomiting.   He had an MRI on 9/19 with normal appearing ducts. He reports having abdominal pain for about 2 weeks with n/v/d.  He knew it was his pancreatitis acting up. The medication wasn't working - oxy/Percocet, phenergan.  He has vomited maybe 3-4 times overnight.  Patient was admitted for recurrent pancreatitis, managed conservatively, diet advanced slowly. At this time he feels well and wants to go home if tolerating diet today.   Patient did tolerate diet and requesting to go home  Discharge Diagnoses:  Principal Problem:   Acute on chronic pancreatitis [facility]) Active Problems:   Pancreatic divisum   Essential hypertension   Congenital single kidney   High cholesterol  Acute on chronic pancreatitis History of stent placement pancreatic divisum-with recent MRI 9/19, no biliary and pancreatic duct dilatation, pancreatic duct and common bile duct appear to insert at the minor papilla: CT abdomen 10/10 with acute pancreatitis no pseudocyst, hepatic steatosis.  Managed with conservative management diet advanced slowly at this time is tolerating diet no vomiting today, pain is controlled continue his outpatient pain medication, pancreatic supplement and feels strong enough to go home today he will follow-up with his physician as  outpatient.   Hypertension on ARBs: Fairly controlled.  Continue the same and monitor Hyperlipidemia not on meds Solitary kidney with normal renal function, being monitored closely avoid nephrotoxic medication Thrombocytopenia: Platelet this morning 145 interestingly was 109 yesterday.  Monitor Mild Macrocytic anemia: Hemoglobin 12.1 g stable. Patient is to follow-up with PCP repeat CBC in 1 week  Consults: none Subjective: Resting comfortably patient is tolerating diet and requesting to go home later in the day  Discharge Exam: Vitals:   01/26/22 0744 01/26/22 1610  BP: (!) 158/104 (!) 146/104  Pulse: 86 83  Resp: 18 18  Temp: 98.3 F (36.8 C) 98.6 F (37 C)  SpO2: 97% 98%   General: Pt is alert, awake, not in acute distress Cardiovascular: RRR, S1/S2 +, no rubs, no gallops Respiratory: CTA bilaterally, no wheezing, no rhonchi Abdominal: Soft, NT, ND, bowel sounds + Extremities: no edema, no cyanosis  Discharge Instructions  Discharge Instructions     Discharge instructions   Complete by: As directed    Please call call MD or return to ER for similar or worsening recurring problem that brought you to hospital or if any fever,nausea/vomiting,abdominal pain, uncontrolled pain, chest pain,  shortness of breath or any other alarming symptoms.  Please follow-up your doctor as instructed in a week time and call the office for appointment.  Please avoid alcohol, smoking, or any other illicit substance and maintain healthy habits including taking your regular medications as prescribed.  You were cared for by a hospitalist during your hospital stay. If you have any questions about your discharge medications or the care you received while you were in the hospital after you are discharged, you can call the unit  and ask to speak with the hospitalist on call if the hospitalist that took care of you is not available.  Once you are discharged, your primary care physician will handle any  further medical issues. Please note that NO REFILLS for any discharge medications will be authorized once you are discharged, as it is imperative that you return to your primary care physician (or establish a relationship with a primary care physician if you do not have one) for your aftercare needs so that they can reassess your need for medications and monitor your lab values   Increase activity slowly   Complete by: As directed       Allergies as of 01/26/2022       Reactions   Diclofenac Hives, Other (See Comments)   Fluid buildup in chest   Norvasc [amlodipine Besylate] Other (See Comments)   Fluid buildup in chest   Omeprazole Other (See Comments)   MD stopped due to pancreatitis   Hydrochlorothiazide    07/31/2016 -  07/20/2016 -  05/28/2016 -   Other Reaction(s) from [place]: itching   Prednisone Hives, Other (See Comments)   Mood swings        Medication List     TAKE these medications    acetaminophen 500 MG tablet Commonly known as: TYLENOL Take 1,000 mg by mouth every 6 (six) hours as needed for mild pain.   albuterol 108 (90 Base) MCG/ACT inhaler Commonly known as: VENTOLIN HFA Inhale 2 puffs into the lungs every 6 (six) hours as needed for wheezing or shortness of breath.   benzonatate 200 MG capsule Commonly known as: TESSALON Take 200 mg by mouth 3 (three) times daily as needed for cough.   cetirizine 10 MG tablet Commonly known as: ZYRTEC Take 10 mg by mouth daily as needed for allergies.   famotidine 20 MG tablet Commonly known as: PEPCID Take 1 tablet (20 mg total) by mouth 2 (two) times daily.   feeding supplement Liqd Take 237 mLs by mouth 2 (two) times daily between meals. What changed:  when to take this additional instructions   fluticasone 50 MCG/ACT nasal spray Commonly known as: FLONASE Place 1 spray into both nostrils daily as needed for allergies.   gabapentin 100 MG capsule Commonly known as: NEURONTIN Take 1 capsule  (100 mg total) by mouth 3 (three) times daily.   ondansetron 4 MG disintegrating tablet Commonly known as: ZOFRAN-ODT Take 1 tablet (4 mg total) by mouth every 8 (eight) hours as needed for nausea or vomiting.   oxyCODONE-acetaminophen 5-325 MG tablet Commonly known as: PERCOCET/ROXICET Take 1 tablet by mouth every 6 (six) hours as needed for severe pain.   Pancreaze 21000-54700 units Cpep Generic drug: Pancrelipase (Lip-Prot-Amyl) Take 1 capsule by mouth with breakfast, with lunch, and with evening meal.   telmisartan 40 MG tablet Commonly known as: MICARDIS Take 1 tablet (40 mg total) by mouth daily.   VISINE-A OP Place 2 drops into both eyes daily.        Follow-up Information     Skeet Latch, MD. Schedule an appointment as soon as possible for a visit.   Specialty: Cardiology Contact information: [address] [place] [address] [phone]                Allergies  Allergen Reactions   Diclofenac Hives and Other (See Comments)    Fluid buildup in chest   Norvasc [Amlodipine Besylate] Other (See Comments)    Fluid buildup in chest  Omeprazole Other (See Comments)    MD stopped due to pancreatitis   Hydrochlorothiazide     07/31/2016 -  07/20/2016 -  05/28/2016 -   Other Reaction(s) from [place]: itching   Prednisone Hives and Other (See Comments)    Mood swings    The results of significant diagnostics from this hospitalization (including imaging, microbiology, ancillary and laboratory) are listed below for reference.    Microbiology: No results found for this or any previous visit (from the past 240 hour(s)).  Procedures/Studies: CT ABDOMEN PELVIS W CONTRAST  Result Date: 01/23/2022 CLINICAL DATA:  Pancreatitis, acute, severe EXAM: CT ABDOMEN AND PELVIS WITH CONTRAST TECHNIQUE: Multidetector CT imaging of the abdomen and pelvis was performed using the standard protocol following bolus administration of intravenous contrast.  RADIATION DOSE REDUCTION: This exam was performed according to the departmental dose-optimization program which includes automated exposure control, adjustment of the mA and/or kV according to patient size and/or use of iterative reconstruction technique. CONTRAST:  5m OMNIPAQUE IOHEXOL 350 MG/ML SOLN COMPARISON:  None Available. FINDINGS: Lower chest: No acute abnormality. Hepatobiliary: The hepatic parenchyma is diffusely hypodense compared to the splenic parenchyma consistent with fatty infiltration. No focal liver abnormality. Status post cholecystectomy. No biliary dilatation. Pancreas: Nonspecific punctate calcification along uncinate process of pancreas. No focal lesion. Mild fat stranding along the proximal pancreas. The pancreatic parenchyma enhances normally. No main pancreatic ductal dilatation. Spleen: Normal in size without focal abnormality. Adrenals/Urinary Tract: No adrenal nodule bilaterally. Atrophic right kidney measuring 3.4 cm. 1.6 cm fluid density lesion within the right kidney likely represents simple renal cyst. The left kidney enhances homogeneously. 3 mm left nephrolithiasis.  No hydronephrosis. No hydroureter. The urinary bladder is unremarkable. On delayed imaging, there is no urothelial wall thickening and there are no filling defects in the opacified portions of the left collecting system. No excretion intravenous contrast on the right. Stomach/Bowel: Stomach is within normal limits. No evidence of bowel wall thickening or dilatation. Colonic diverticulosis. Appendix appears normal. Vascular/Lymphatic: No hyperdense vessel or unexpected calcification. Reproductive: Prostate is unremarkable. Other: No intraperitoneal free fluid. No intraperitoneal free gas. No organized fluid collection. Musculoskeletal: No abdominal wall hernia or abnormality. No suspicious lytic or blastic osseous lesions. No acute displaced fracture. L5-S1 intervertebral disc vacuum phenomenon. IMPRESSION: 1. Acute  pancreatitis.  No pseudocyst. 2. Hepatic steatosis. 3. Diverticulosis with no acute diverticulitis. 4. Atrophic right kidney. Electronically Signed   By: MIven FinnM.D.   On: 01/23/2022 16:19    Labs: BNP (last 3 results) No results for input(s): "BNP" in the last 8760 hours. Basic Metabolic Panel: Recent Labs  Lab 01/23/22 1645 01/25/22 0406 01/26/22 0409  NA 138 135 135  K 3.8 3.9 3.7  CL 106 100 101  CO2 24 20* 26  GLUCOSE 117* 82 95  BUN <5* <5* 5*  CREATININE 1.17 0.69 1.05  CALCIUM 8.7* 8.3* 8.8*   Liver Function Tests: Recent Labs  Lab 01/23/22 1645 01/25/22 0406 01/26/22 0409  AST 54* 19 38  ALT '25 13 22  '$ ALKPHOS 68 37* 53  BILITOT 1.1 0.9 1.1  PROT 6.7 4.1* 6.1*  ALBUMIN 3.5 2.0* 3.0*   Recent Labs  Lab 01/23/22 1645  LIPASE 156*   No results for input(s): "AMMONIA" in the last 168 hours. CBC: Recent Labs  Lab 01/23/22 1447 01/25/22 0406 01/26/22 0409  WBC 7.5 7.2 6.6  HGB 13.5 9.8* 12.1*  HCT 40.5 28.0* 35.6*  MCV 105.5* 102.2* 102.3*  PLT 161 109* 145*  Cardiac Enzymes: No results for input(s): "CKTOTAL", "CKMB", "CKMBINDEX", "TROPONINI" in the last 168 hours. BNP: Invalid input(s): "POCBNP" CBG: No results for input(s): "GLUCAP" in the last 168 hours. D-Dimer No results for input(s): "DDIMER" in the last 72 hours. Hgb A1c No results for input(s): "HGBA1C" in the last 72 hours. Lipid Profile No results for input(s): "CHOL", "HDL", "LDLCALC", "TRIG", "CHOLHDL", "LDLDIRECT" in the last 72 hours. Thyroid function studies No results for input(s): "TSH", "T4TOTAL", "T3FREE", "THYROIDAB" in the last 72 hours.  Invalid input(s): "FREET3" Anemia work up No results for input(s): "VITAMINB12", "FOLATE", "FERRITIN", "TIBC", "IRON", "RETICCTPCT" in the last 72 hours. Urinalysis    Component Value Date/Time   COLORURINE YELLOW 01/23/2022 0730   APPEARANCEUR CLEAR 01/23/2022 0730   LABSPEC 1.017 01/23/2022 0730   PHURINE 5.0 01/23/2022  0730   GLUCOSEU NEGATIVE 01/23/2022 0730   HGBUR MODERATE (A) 01/23/2022 0730   BILIRUBINUR NEGATIVE 01/23/2022 0730   KETONESUR 5 (A) 01/23/2022 0730   PROTEINUR 30 (A) 01/23/2022 0730   UROBILINOGEN 0.2 01/20/2015 1643   NITRITE NEGATIVE 01/23/2022 0730   LEUKOCYTESUR NEGATIVE 01/23/2022 0730   Sepsis Labs Recent Labs  Lab 01/23/22 1447 01/25/22 0406 01/26/22 0409  WBC 7.5 7.2 6.6   Microbiology No results found for this or any previous visit (from the past 240 hour(s)).   Time coordinating discharge: 35 minutes  SIGNED: Antonieta Pert, MD  [facility] 01/27/2022, 7:55 AM  If 7PM-7AM, please contact night-coverage [email or web address]

## 2022-01-26 NOTE — Hospital Course (Addendum)
51 y.o. male with medical history significant of chronic pancreatitis with h/o stent placement, pancreatic divisum; HTN; HLD; and solitary kidney presenting with abdominal pain, vomiting.   He had an MRI on 9/19 with normal appearing ducts. He reports having abdominal pain for about 2 weeks with n/v/d.  He knew it was his pancreatitis acting up. The medication wasn't working - oxy/Percocet, phenergan.  He has vomited maybe 3-4 times overnight.  Patient was admitted for recurrent pancreatitis, managed conservatively, diet advanced slowly. At this time he feels well and wants to go home if tolerating diet today.

## 2022-01-26 NOTE — Progress Notes (Signed)
PROGRESS NOTE Kyle Berry  WJX:[id number] DOB: 02-Dec-1970 DOA: 01/23/2022 PCP: [facility]   Brief Narrative/Hospital Course: 51 y.o. male with medical history significant of chronic pancreatitis with h/o stent placement, pancreatic divisum; HTN; HLD; and solitary kidney presenting with abdominal pain, vomiting.   He had an MRI on 9/19 with normal appearing ducts. He reports having abdominal pain for about 2 weeks with n/v/d.  He knew it was his pancreatitis acting up. The medication wasn't working - oxy/Percocet, phenergan.  He has vomited maybe 3-4 times overnight.  Patient is admitted for recurrent pancreatitis,    Subjective: Seen and examined this morning reports he vomited this morning.  On clear liquid diet, Tmax  in 99.  Labs fairly stable oncology CBC from yesterday are a spuriously abnormal  Assessment and Plan: Principal Problem:   Acute on chronic pancreatitis [facility]) Active Problems:   Pancreatic divisum   Essential hypertension   Congenital single kidney   High cholesterol   Acute on chronic pancreatitis History of stent placement pancreatic divisum-with recent MRI 9/19, no biliary and pancreatic duct dilatation, pancreatic duct and common bile duct appear to insert at the minor papilla: CT abdomen 10/10 with acute pancreatitis no pseudocyst, hepatic steatosis.  Continue current conservative management IV fluids, pain control with oral and IV opiates added Norco, continue antiemetics, trend LFTs lipase.  He will need to follow-up with GI as outpatient soon.  Hypertension on ARBs: Fairly controlled.  Continue the same and monitor Hyperlipidemia not on meds Solitary kidney with normal renal function, being monitored closely avoid nephrotoxic medication Thrombocytopenia: Platelet this morning 145 interestingly was 109 yesterday.  Monitor Mild Macrocytic anemia: Hemoglobin 12.1 g stable. Recent Labs  Lab 01/23/22 1447 01/25/22 0406  01/26/22 0409  HGB 13.5 9.8* 12.1*  HCT 40.5 28.0* 35.6*    DVT prophylaxis: SCDs Start: 01/24/22 0916 Code Status:   Code Status: Full Code Family Communication: plan of care discussed with patient at bedside. Patient status is: Inpatient because of pancreatitis Level of care: Med-Surg   Dispo: The patient is from: Home            Anticipated disposition: Home once tolerating diet  Mobility Assessment (last 72 hours)     Mobility Assessment     Row Name 01/25/22 1930 01/24/22 1930 01/24/22 1800 01/24/22 1743     Does patient have an order for bedrest or is patient medically unstable No - Continue assessment No - Continue assessment No - Continue assessment No - Continue assessment    What is the highest level of mobility based on the progressive mobility assessment? Level 6 (Walks independently in room and hall) - Balance while walking in room without assist - Complete Level 6 (Walks independently in room and hall) - Balance while walking in room without assist - Complete Level 6 (Walks independently in room and hall) - Balance while walking in room without assist - Complete --              Objective: Vitals last 24 hrs: Vitals:   01/25/22 1856 01/25/22 2047 01/26/22 0522 01/26/22 0744  BP: (!) 162/96 (!) 148/99 (!) 140/96 (!) 158/104  Pulse: 72 91 67 86  Resp:  '18 17 18  '$ Temp:  98.9 F (37.2 C) 98.4 F (36.9 C) 98.3 F (36.8 C)  TempSrc:  Oral  Oral  SpO2:  99% 100% 97%  Weight:      Height:       Weight change:  Physical Examination: General exam: alert awake, older than stated age HEENT:Oral mucosa moist, Ear/Nose WNL grossly Respiratory system: bilaterally clear BS, no use of accessory muscle Cardiovascular system: S1 & S2 +, No JVD. Gastrointestinal system: Abdomen soft, guarding present tender mid abdomen , BS+ Nervous System:Alert, awake, moving extremities. Extremities: LE edema neg,distal peripheral pulses palpable.  Skin: No rashes,no icterus. MSK:  Normal muscle bulk,tone, power  Medications reviewed:  Scheduled Meds:  gabapentin  100 mg Oral TID   irbesartan  150 mg Oral Daily   Continuous Infusions:  lactated ringers 200 mL/hr at 01/26/22 0737      Diet Order             Diet clear liquid Room service appropriate? Yes; Fluid consistency: Thin  Diet effective now                  Intake/Output Summary (Last 24 hours) at 01/26/2022 1209 Last data filed at 01/26/2022 0111 Gross per 24 hour  Intake 1964.59 ml  Output --  Net 1964.59 ml   Net IO Since Admission: 5,895.7 mL [01/26/22 1209]  Wt Readings from Last 3 Encounters:  01/23/22 73 kg  10/24/21 73 kg  09/19/21 72.4 kg     Unresulted Labs (From admission, onward)    None     Data Reviewed: I have personally reviewed following labs and imaging studies CBC: Recent Labs  Lab 01/23/22 1447 01/25/22 0406 01/26/22 0409  WBC 7.5 7.2 6.6  HGB 13.5 9.8* 12.1*  HCT 40.5 28.0* 35.6*  MCV 105.5* 102.2* 102.3*  PLT 161 109* 106*   Basic Metabolic Panel: Recent Labs  Lab 01/23/22 1645 01/25/22 0406 01/26/22 0409  NA 138 135 135  K 3.8 3.9 3.7  CL 106 100 101  CO2 24 20* 26  GLUCOSE 117* 82 95  BUN <5* <5* 5*  CREATININE 1.17 0.69 1.05  CALCIUM 8.7* 8.3* 8.8*   GFR: Estimated Creatinine Clearance: 85 mL/min (by C-G formula based on SCr of 1.05 mg/dL). Liver Function Tests: Recent Labs  Lab 01/23/22 1645 01/25/22 0406 01/26/22 0409  AST 54* 19 38  ALT '25 13 22  '$ ALKPHOS 68 37* 53  BILITOT 1.1 0.9 1.1  PROT 6.7 4.1* 6.1*  ALBUMIN 3.5 2.0* 3.0*   Recent Labs  Lab 01/23/22 1645  LIPASE 156*   No results for input(s): "AMMONIA" in the last 168 hours. Coagulation Profile: No results for input(s): "INR", "PROTIME" in the last 168 hours. BNP (last 3 results) No results for input(s): "PROBNP" in the last 8760 hours. HbA1C: No results for input(s): "HGBA1C" in the last 72 hours. CBG: No results for input(s): "GLUCAP" in the last 168  hours. Lipid Profile: No results for input(s): "CHOL", "HDL", "LDLCALC", "TRIG", "CHOLHDL", "LDLDIRECT" in the last 72 hours. Thyroid Function Tests: No results for input(s): "TSH", "T4TOTAL", "FREET4", "T3FREE", "THYROIDAB" in the last 72 hours. Sepsis Labs: Recent Labs  Lab 01/23/22 1431  LATICACIDVEN 1.8    No results found for this or any previous visit (from the past 240 hour(s)).  Antimicrobials: Anti-infectives (From admission, onward)    None      Culture/Microbiology    Component Value Date/Time   SDES URINE, CLEAN CATCH 04/21/2021 0623   SPECREQUEST NONE 04/21/2021 0623   CULT  04/21/2021 0623    NO GROWTH Performed at [facility] [address] [place], [place] [address]    REPTSTATUS 04/23/2021 FINAL 04/21/2021 5462    Other culture-see note  Radiology Studies:  No results found.   LOS: 2 days   Antonieta Pert, MD [facility]  01/26/2022, 12:09 PM

## 2022-01-26 NOTE — Progress Notes (Signed)
Mobility Specialist Progress Note:   01/26/22 1020  Mobility  Activity Ambulated independently in hallway  Level of Assistance Modified independent, requires aide device or extra time  Assistive Device Other (Comment) (IV Pole)  Distance Ambulated (ft) 750 ft  Activity Response Tolerated well  Mobility Referral Yes  $Mobility charge 1 Mobility   Pt received in bed and agreeable. No complaints. Pt left sitting EOB with all needs met and call bell in reach.   Virda Betters Mobility Specialist-Acute Rehab Secure Chat only

## 2022-03-05 ENCOUNTER — Other Ambulatory Visit (HOSPITAL_COMMUNITY): Payer: Self-pay

## 2022-03-05 ENCOUNTER — Encounter (HOSPITAL_COMMUNITY): Payer: Self-pay

## 2022-03-05 ENCOUNTER — Inpatient Hospital Stay (HOSPITAL_COMMUNITY)
Admission: EM | Admit: 2022-03-05 | Discharge: 2022-03-07 | DRG: 439 | Disposition: A | Discharge status: Home / Self Care | Payer: Managed Care, Other (non HMO) | Attending: Family Medicine | Admitting: Family Medicine

## 2022-03-05 ENCOUNTER — Emergency Department (HOSPITAL_COMMUNITY): Payer: Managed Care, Other (non HMO)

## 2022-03-05 ENCOUNTER — Other Ambulatory Visit: Payer: Self-pay

## 2022-03-05 DIAGNOSIS — K861 Other chronic pancreatitis: Secondary | ICD-10-CM | POA: Diagnosis present

## 2022-03-05 DIAGNOSIS — R7401 Elevation of levels of liver transaminase levels: Secondary | ICD-10-CM | POA: Diagnosis not present

## 2022-03-05 DIAGNOSIS — I10 Essential (primary) hypertension: Secondary | ICD-10-CM | POA: Diagnosis present

## 2022-03-05 DIAGNOSIS — N261 Atrophy of kidney (terminal): Secondary | ICD-10-CM | POA: Diagnosis present

## 2022-03-05 DIAGNOSIS — Z833 Family history of diabetes mellitus: Secondary | ICD-10-CM

## 2022-03-05 DIAGNOSIS — R109 Unspecified abdominal pain: Principal | ICD-10-CM

## 2022-03-05 DIAGNOSIS — Q600 Renal agenesis, unilateral: Secondary | ICD-10-CM

## 2022-03-05 DIAGNOSIS — Z841 Family history of disorders of kidney and ureter: Secondary | ICD-10-CM

## 2022-03-05 DIAGNOSIS — K219 Gastro-esophageal reflux disease without esophagitis: Secondary | ICD-10-CM | POA: Diagnosis present

## 2022-03-05 DIAGNOSIS — G8929 Other chronic pain: Secondary | ICD-10-CM | POA: Diagnosis present

## 2022-03-05 DIAGNOSIS — N179 Acute kidney failure, unspecified: Secondary | ICD-10-CM | POA: Diagnosis present

## 2022-03-05 DIAGNOSIS — E7800 Pure hypercholesterolemia, unspecified: Secondary | ICD-10-CM | POA: Diagnosis present

## 2022-03-05 DIAGNOSIS — M545 Low back pain, unspecified: Secondary | ICD-10-CM | POA: Diagnosis present

## 2022-03-05 DIAGNOSIS — K859 Acute pancreatitis without necrosis or infection, unspecified: Principal | ICD-10-CM | POA: Diagnosis present

## 2022-03-05 DIAGNOSIS — E876 Hypokalemia: Secondary | ICD-10-CM | POA: Diagnosis present

## 2022-03-05 DIAGNOSIS — Z8249 Family history of ischemic heart disease and other diseases of the circulatory system: Secondary | ICD-10-CM

## 2022-03-05 DIAGNOSIS — K3184 Gastroparesis: Secondary | ICD-10-CM | POA: Diagnosis present

## 2022-03-05 DIAGNOSIS — Z888 Allergy status to other drugs, medicaments and biological substances status: Secondary | ICD-10-CM

## 2022-03-05 LAB — CBC WITH DIFFERENTIAL/PLATELET
Abs Immature Granulocytes: 0.01 10*3/uL (ref 0.00–0.07)
Basophils Absolute: 0 10*3/uL (ref 0.0–0.1)
Basophils Relative: 0 %
Eosinophils Absolute: 0 10*3/uL (ref 0.0–0.5)
Eosinophils Relative: 1 %
HCT: 37.7 % — ABNORMAL LOW (ref 39.0–52.0)
Hemoglobin: 12.8 g/dL — ABNORMAL LOW (ref 13.0–17.0)
Immature Granulocytes: 0 %
Lymphocytes Relative: 35 %
Lymphs Abs: 2.2 10*3/uL (ref 0.7–4.0)
MCH: 34.3 pg — ABNORMAL HIGH (ref 26.0–34.0)
MCHC: 34 g/dL (ref 30.0–36.0)
MCV: 101.1 fL — ABNORMAL HIGH (ref 80.0–100.0)
Monocytes Absolute: 0.6 10*3/uL (ref 0.1–1.0)
Monocytes Relative: 9 %
Neutro Abs: 3.5 10*3/uL (ref 1.7–7.7)
Neutrophils Relative %: 55 %
Platelets: 160 10*3/uL (ref 150–400)
RBC: 3.73 MIL/uL — ABNORMAL LOW (ref 4.22–5.81)
RDW: 13.6 % (ref 11.5–15.5)
WBC: 6.3 10*3/uL (ref 4.0–10.5)
nRBC: 0 % (ref 0.0–0.2)

## 2022-03-05 LAB — COMPREHENSIVE METABOLIC PANEL
ALT: 50 U/L — ABNORMAL HIGH (ref 0–44)
AST: 81 U/L — ABNORMAL HIGH (ref 15–41)
Albumin: 3.3 g/dL — ABNORMAL LOW (ref 3.5–5.0)
Alkaline Phosphatase: 67 U/L (ref 38–126)
Anion gap: 12 (ref 5–15)
BUN: 6 mg/dL (ref 6–20)
CO2: 22 mmol/L (ref 22–32)
Calcium: 8.8 mg/dL — ABNORMAL LOW (ref 8.9–10.3)
Chloride: 103 mmol/L (ref 98–111)
Creatinine, Ser: 1.4 mg/dL — ABNORMAL HIGH (ref 0.61–1.24)
GFR, Estimated: >60 mL/min (ref >60)
Glucose, Bld: 116 mg/dL — ABNORMAL HIGH (ref 70–99)
Potassium: 3.6 mmol/L (ref 3.5–5.1)
Sodium: 137 mmol/L (ref 135–145)
Total Bilirubin: 0.6 mg/dL (ref 0.3–1.2)
Total Protein: 6.4 g/dL — ABNORMAL LOW (ref 6.5–8.1)

## 2022-03-05 LAB — URINALYSIS, ROUTINE W REFLEX MICROSCOPIC
Bilirubin Urine: NEGATIVE
Glucose, UA: NEGATIVE mg/dL
Hgb urine dipstick: NEGATIVE
Ketones, ur: NEGATIVE mg/dL
Leukocytes,Ua: NEGATIVE
Nitrite: NEGATIVE
Protein, ur: NEGATIVE mg/dL
Specific Gravity, Urine: 1.016 (ref 1.005–1.030)
pH: 5 (ref 5.0–8.0)

## 2022-03-05 LAB — LIPASE, BLOOD: Lipase: 53 U/L — ABNORMAL HIGH (ref 11–51)

## 2022-03-05 LAB — TROPONIN I (HIGH SENSITIVITY): Troponin I (High Sensitivity): 5 ng/L (ref <18)

## 2022-03-05 LAB — ETHANOL: Alcohol, Ethyl (B): <10 mg/dL (ref <10)

## 2022-03-05 MED ORDER — HYDROMORPHONE HCL 1 MG/ML IJ SOLN
1.0000 mg | Freq: Once | INTRAMUSCULAR | Status: AC | PRN
  Administered 2022-03-05: 1 mg via INTRAVENOUS
  Filled 2022-03-05: qty 1

## 2022-03-05 MED ORDER — MORPHINE SULFATE (PF) 2 MG/ML IV SOLN
2.0000 mg | INTRAVENOUS | Status: DC | PRN
  Administered 2022-03-05 – 2022-03-06 (×4): 2 mg via INTRAVENOUS
  Filled 2022-03-05 (×4): qty 1

## 2022-03-05 MED ORDER — ONDANSETRON HCL 4 MG PO TABS: 4.0000 mg | ORAL_TABLET | Freq: Four times a day (QID) | ORAL | Status: DC | PRN

## 2022-03-05 MED ORDER — ONDANSETRON HCL 4 MG/2ML IJ SOLN
4.0000 mg | Freq: Four times a day (QID) | INTRAMUSCULAR | Status: DC | PRN
  Administered 2022-03-06: 4 mg via INTRAVENOUS
  Filled 2022-03-05: qty 2

## 2022-03-05 MED ORDER — HYDROMORPHONE HCL 1 MG/ML IJ SOLN
1.0000 mg | Freq: Once | INTRAMUSCULAR | Status: AC
  Administered 2022-03-05: 1 mg via INTRAVENOUS
  Filled 2022-03-05: qty 1

## 2022-03-05 MED ORDER — GABAPENTIN 100 MG PO CAPS
100.0000 mg | ORAL_CAPSULE | Freq: Three times a day (TID) | ORAL | Status: DC
  Administered 2022-03-05 – 2022-03-07 (×4): 100 mg via ORAL
  Filled 2022-03-05 (×5): qty 1

## 2022-03-05 MED ORDER — LACTATED RINGERS IV BOLUS
1000.0000 mL | Freq: Once | INTRAVENOUS | Status: AC
  Administered 2022-03-05: 1000 mL via INTRAVENOUS

## 2022-03-05 MED ORDER — ACETAMINOPHEN 325 MG PO TABS: 650.0000 mg | ORAL_TABLET | Freq: Four times a day (QID) | ORAL | Status: DC | PRN

## 2022-03-05 MED ORDER — HYDRALAZINE HCL 20 MG/ML IJ SOLN: 5.0000 mg | INTRAMUSCULAR | Status: DC | PRN

## 2022-03-05 MED ORDER — ACETAMINOPHEN 650 MG RE SUPP: 650.0000 mg | Freq: Four times a day (QID) | RECTAL | Status: DC | PRN

## 2022-03-05 MED ORDER — ALBUTEROL SULFATE (2.5 MG/3ML) 0.083% IN NEBU: 3.0000 mL | INHALATION_SOLUTION | Freq: Four times a day (QID) | RESPIRATORY_TRACT | Status: DC | PRN

## 2022-03-05 MED ORDER — LACTATED RINGERS IV SOLN: INTRAVENOUS | Status: DC

## 2022-03-05 MED ORDER — IOHEXOL 350 MG/ML SOLN
75.0000 mL | Freq: Once | INTRAVENOUS | Status: AC | PRN
  Administered 2022-03-05: 75 mL via INTRAVENOUS

## 2022-03-05 MED ORDER — ONDANSETRON HCL 4 MG/2ML IJ SOLN
4.0000 mg | Freq: Once | INTRAMUSCULAR | Status: AC
  Administered 2022-03-05: 4 mg via INTRAVENOUS
  Filled 2022-03-05: qty 2

## 2022-03-05 NOTE — Progress Notes (Signed)
Patient arrived to [place] room 29 alert and oriented. Pain level 3/10. Bed in lowest position call light in reach will continue to monitor pt.

## 2022-03-05 NOTE — ED Notes (Signed)
Patient transported to CT 

## 2022-03-05 NOTE — H&P (Signed)
History and Physical    Patient: Kyle Berry OAC:[id number] DOB: 11-16-1970 DOA: 03/05/2022 DOS: the patient was seen and examined on 03/05/2022 PCP: [place]  Patient coming from: Home - lives with girlfriend; NOK: Mother, Dhillon [place], [phone]    Chief Complaint: Abdominal pain, n/v  HPI: Kyle Berry is a 51 y.o. male with medical history significant of chronic pancreatitis with h/o stent placement, pancreatic divisum; HTN; HLD; and solitary kidney presenting with abdominal pain, vomiting.  He was last admitted from 10/10-13 with acute on chronic pancreatitis, managed conservatively.  He reports that he did ok for a few weeks and then about 2 weeks ago he thought it was flaring up.  He tried to be cautious with his diet but it worsened in the last few days.  +n/v, LUQ pain, decreased UOP.  It worsened again overnight and he decided to come in.  He was given IVF in the ER and has already voided about 1 liter.      ER Course:  Recurrent pancreatitis.  Patient reports unable to take PO.  Has AKI and a solitary kidney.     Review of Systems: As mentioned in the history of present illness. All other systems reviewed and are negative. Past Medical History:  Diagnosis Date   Atrophy of right kidney    Chronic bronchitis (HCC)    Chronic lower back pain    Chronic pancreatitis ([place])    Gastroparesis 06/2020   confirmed by emptying study at [facility]   GERD (gastroesophageal reflux disease)    Headache    "once/month" (05/13/2017)   High cholesterol    Hypertension    Migraine    "a couple/year" (05/13/2017)   Nephrolithiasis 05/13/2017   Pancreatitis    Pneumonia ~ 09/2015   Presence of pancreatic duct stent    Recurrent acute pancreatitis    Shortness of breath 05/22/2021   Past Surgical History:  Procedure Laterality Date   BIOPSY  11/02/2020   Procedure: BIOPSY;  Surgeon: Irving Copas., MD;  Location: [place];   Service: Gastroenterology;;   CHOLECYSTECTOMY N/A 10/23/2017   Procedure: LAPAROSCOPIC CHOLECYSTECTOMY WITH INTRAOPERATIVE CHOLANGIOGRAM;  Surgeon: Johnathan Hausen, MD;  Location: [facility];  Service: General;  Laterality: N/A;   COLONOSCOPY WITH PROPOFOL N/A 11/02/2019   Procedure: COLONOSCOPY WITH PROPOFOL;  Surgeon: Irene Shipper, MD;  Location[facility] ENDOSCOPY;  Service: Endoscopy;  Laterality: N/A;   ERCP N/A 11/02/2020   Procedure: ENDOSCOPIC RETROGRADE CHOLANGIOPANCREATOGRAPHY (ERCP);  Surgeon: Irving Copas., MD;  Location: [place];  Service: Gastroenterology;  Laterality: N/A;   ESOPHAGOGASTRODUODENOSCOPY N/A 11/02/2020   Procedure: ESOPHAGOGASTRODUODENOSCOPY (EGD);  Surgeon: Irving Copas., MD;  Location: [place];  Service: Gastroenterology;  Laterality: N/A;   ESOPHAGOGASTRODUODENOSCOPY (EGD) WITH PROPOFOL N/A 11/06/2019   Procedure: ESOPHAGOGASTRODUODENOSCOPY (EGD) WITH PROPOFOL;  Surgeon: Irene Shipper, MD;  Location: [facility] ENDOSCOPY;  Service: Endoscopy;  Laterality: N/A;   HEMOSTASIS CLIP PLACEMENT  11/02/2019   Procedure: HEMOSTASIS CLIP PLACEMENT;  Surgeon: Irene Shipper, MD;  Location: [facility] ENDOSCOPY;  Service: Endoscopy;;   NO PAST SURGERIES     REMOVAL OF STONES  11/02/2020   Procedure: REMOVAL OF STONES;  Surgeon: Irving Copas., MD;  Location: [place];  Service: Gastroenterology;;   Lavell Islam REMOVAL  11/02/2020   Procedure: STENT REMOVAL;  Surgeon: Irving Copas., MD;  Location: [place];  Service: Gastroenterology;;   Social History:  reports that he has never smoked. He has never used  smokeless tobacco. He reports that he does not currently use alcohol. He reports that he does not use drugs.  Allergies  Allergen Reactions   Diclofenac Hives and Other (See Comments)    Fluid buildup in chest   Norvasc [Amlodipine Besylate] Other (See Comments)    Fluid buildup in chest   Omeprazole Other (See Comments)    MD stopped due to  pancreatitis   Hydrochlorothiazide     07/31/2016 -  07/20/2016 -  05/28/2016 -   Other Reaction(s) from [place]: itching   Prednisone Hives and Other (See Comments)    Mood swings    Family History  Problem Relation Age of Onset   Hypertension Other    Hypertension Mother    Hypertension Father    Kidney disease Father    Hypertension Sister    Diabetes Sister    Hypertension Brother    Pancreatic cancer Paternal Grandmother    Colon cancer Cousin    Stomach cancer Neg Hx    Esophageal cancer Neg Hx     Prior to Admission medications   Medication Sig Start Date End Date Taking? Authorizing Provider  acetaminophen (TYLENOL) 500 MG tablet Take 1,000 mg by mouth every 6 (six) hours as needed for mild pain.    [provider]  albuterol (VENTOLIN HFA) 108 (90 Base) MCG/ACT inhaler Inhale 2 puffs into the lungs every 6 (six) hours as needed for wheezing or shortness of breath.    [provider]  benzonatate (TESSALON) 200 MG capsule Take 200 mg by mouth 3 (three) times daily as needed for cough. 01/15/22   [provider]  cetirizine (ZYRTEC) 10 MG tablet Take 10 mg by mouth daily as needed for allergies. 01/15/22   [provider]  famotidine (PEPCID) 20 MG tablet Take 1 tablet (20 mg total) by mouth 2 (two) times daily. 09/26/21 03/25/22  Barb Merino, MD  feeding supplement (ENSURE ENLIVE / ENSURE PLUS) LIQD Take 237 mLs by mouth 2 (two) times daily between meals. Patient taking differently: Take 237 mLs by mouth See admin instructions. Drink 237 ml by mouth 2-3 times a day between meals 02/04/21   Dahal, Marlowe Aschoff, MD  fluticasone (FLONASE) 50 MCG/ACT nasal spray Place 1 spray into both nostrils daily as needed for allergies. 01/15/22   [provider]  gabapentin (NEURONTIN) 100 MG capsule Take 1 capsule (100 mg total) by mouth 3 (three) times daily. 10/27/21   Lavina Hamman, MD  Naphazoline-Pheniramine (VISINE-A OP) Place 2 drops  into both eyes daily.    [provider]  ondansetron (ZOFRAN-ODT) 4 MG disintegrating tablet Take 1 tablet (4 mg total) by mouth every 8 (eight) hours as needed for nausea or vomiting. 12/24/21   Tedd Sias, PA  oxyCODONE-acetaminophen (PERCOCET/ROXICET) 5-325 MG tablet Take 1 tablet by mouth every 6 (six) hours as needed for severe pain. 12/24/21   Fondaw, Kathleene Hazel, PA  Pancrelipase, Lip-Prot-Amyl, (PANCREAZE) [phone] units CPEP Take 1 capsule by mouth with breakfast, with lunch, and with evening meal.    [provider]  telmisartan (MICARDIS) 40 MG tablet Take 1 tablet (40 mg total) by mouth daily. 04/26/21 01/24/22  Idamae Schuller, MD    Physical Exam: Vitals:   03/05/22 0900 03/05/22 1000 03/05/22 1101 03/05/22 1115  BP: (!) 147/92 (!) 157/105 (!) 158/97 (!) 154/99  Pulse: 91 86 85 81  Resp: 20 (!) '21 17 18  '$ Temp:   98.9 F (37.2 C)   TempSrc:  Oral   SpO2: 95% 97% 98% 95%  Weight:      Height:       General:  Appears calm and comfortable and is in NAD Eyes:   EOMI, normal lids, iris ENT:  grossly normal hearing, lips & tongue, mmm; suboptimal dentition Neck:  no LAD, masses or thyromegaly Cardiovascular:  RRR, no m/r/g. No LE edema.  Respiratory:   CTA bilaterally with no wheezes/rales/rhonchi.  Normal respiratory effort. Abdomen:  LUQ TTP, ND Skin:  no rash or induration seen on limited exam Musculoskeletal:  grossly normal tone BUE/BLE, good ROM, no bony abnormality Psychiatric:  blunted mood and affect, speech fluent and appropriate, AOx3 Neurologic:  CN 2-12 grossly intact, moves all extremities in coordinated fashion   Radiological Exams on Admission: Independently reviewed - see discussion in A/P where applicable  CT ABDOMEN PELVIS W CONTRAST  Result Date: 03/05/2022 CLINICAL DATA:  Abdominal pain and vomiting. History of chronic pancreatitis. Minimally elevated serum lipase levels. EXAM: CT ABDOMEN AND PELVIS WITH CONTRAST TECHNIQUE:  Multidetector CT imaging of the abdomen and pelvis was performed using the standard protocol following bolus administration of intravenous contrast. RADIATION DOSE REDUCTION: This exam was performed according to the departmental dose-optimization program which includes automated exposure control, adjustment of the mA and/or kV according to patient size and/or use of iterative reconstruction technique. CONTRAST:  50m OMNIPAQUE IOHEXOL 350 MG/ML SOLN COMPARISON:  Abdominopelvic CT 01/23/2022 and 12/24/2021. FINDINGS: Lower chest: Mild subsegmental atelectasis at both lung bases. No significant pleural or pericardial effusion. Hepatobiliary: Subjective mild hepatic steatosis, as before. No suspicious focal hepatic abnormality. Status post cholecystectomy, without significant biliary dilatation. Pancreas: Stable punctate calcification within the uncinate process and mild soft tissue stranding around the pancreatic head. No pancreatic ductal dilatation, hemorrhage, necrosis or surrounding fluid collection. Spleen: Normal in size without focal abnormality. Adrenals/Urinary Tract: Both adrenal glands appear normal. Unchanged small nonobstructing calculi in the lower pole of the left kidney. Nonfunctional right kidney with severe atrophy and small cysts are unchanged; no specific follow-up imaging recommended. No evidence of ureteral calculus or hydronephrosis. The bladder appears unremarkable for its degree of distention. Stomach/Bowel: No enteric contrast administered. The stomach appears unremarkable for its degree of distension. No evidence of bowel wall thickening, distention or surrounding inflammatory change. The appendix appears normal. Mild colonic diverticulosis, greatest proximally. Vascular/Lymphatic: There are no enlarged abdominal or pelvic lymph nodes. No significant vascular findings. Reproductive: The prostate gland and seminal vesicles appear unremarkable. Other: No evidence of abdominal wall mass or  hernia. No ascites. Musculoskeletal: No acute or significant osseous findings. Chronic degenerative disc disease at L5-S1. The right sacroiliac joint is partially ankylosed. IMPRESSION: 1. Stable appearance of the pancreas with mild soft tissue stranding around the pancreatic head and a punctate calcification in the uncinate process, consistent with mild acute on chronic pancreatitis. No evidence of pancreatic hemorrhage or surrounding fluid collection. 2. No other acute findings or significant changes from previous studies. 3. Nonobstructing left renal calculi. Nonfunctional right kidney. 4. Mild colonic diverticulosis. Electronically Signed   By: WRichardean SaleM.D.   On: 03/05/2022 12:11    EKG: Independently reviewed.  NSR with rate 89; nonspecific ST changes with no evidence of acute ischemia   Labs on Admission: I have personally reviewed the available labs and imaging studies at the time of the admission.  Pertinent labs:    Glucose 116 BUN 6/Creatinine 1.40/GFR >60; 5/1.05 on 10/13 Albumin 3.3 Lipase 53 AST 81/ALT 50 HS troponin 5 WBC 6.2  Unremarkable CBC Normal UA ETOH <10   Assessment and Plan: Principal Problem:   Acute on chronic pancreatitis (HCC) Active Problems:   Essential hypertension   Congenital single kidney   High cholesterol   AKI (acute kidney injury) ([place])    Acute on chronic pancreatitis -Patient with prior h/o acute and chronic pancreatitis presenting with similar symptoms -C/w prior presentations - epigastric and LUQ pain with associated n/v/d -Appears to have mild pancreatitis by H&P, mildly elevated LFTs, minimally elevated lipase, and CT -Will observe on med surg  -Strict NPO for now -Aggressive IVF hydration at least for the first 12 hours with LR at 200 cc/hr -Pain control with morphine 2 mg q2h prn.   -Nausea control with Zofran -Scant LFT/lipase elevation -He has had an extensive prior evaluation with known h/o pancreas divisum and s/p ERCP  with biliary duct sphincterotomy and pancreatic duct placement at [facility] (removed on 7/20) -He takes pancreatic enzymes at home, will resume once eating again -Continue Neurontin -If not improving quickly, recommend GI consult  AKI with solitary kidney -Suspect this was related to n/v/d and inability to tolerate PO -Use caution with nephrotoxic agents -Recheck BMP in AM   HTN -Hold telmisartan  -Will cover with IV prn hydralazine   HLD -He does not appear to be taking medications for this issue at this time      Advance Care Planning:   Code Status: Full Code   Consults: None  DVT Prophylaxis: SCDs  Family Communication: None present; he is capable of communicating with his family at this time  Severity of Illness: The appropriate patient status for this patient is OBSERVATION. Observation status is judged to be reasonable and necessary in order to provide the required intensity of service to ensure the patient's safety. The patient's presenting symptoms, physical exam findings, and initial radiographic and laboratory data in the context of their medical condition is felt to place them at decreased risk for further clinical deterioration. Furthermore, it is anticipated that the patient will be medically stable for discharge from the hospital within 2 midnights of admission.   Author: Karmen Bongo, MD 03/05/2022 1:09 PM  For on call review [email or web address].

## 2022-03-05 NOTE — ED Triage Notes (Incomplete)
Upper abdominal pain for the past week. Nausea and emesis. Denies etoh use.

## 2022-03-05 NOTE — ED Notes (Signed)
Got patient into a gown on the monitor patient is resting with call bell in reach 

## 2022-03-05 NOTE — ED Provider Notes (Signed)
[facility] EMERGENCY DEPARTMENT Provider Note   CSN: [id number] Arrival date & time: 03/05/22  0321     History  Chief Complaint  Patient presents with   Abdominal Pain    Kyle Berry is a 51 y.o. male.  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  He felt like he started having a flare of pancreatitis that began about 2 weeks ago.  Whenever he feels like he is having a flare he will stop eating or only have a liquid diet.  He had some leftover Percocet at home but this was not touching the pain.  He has since run out of the Percocet.  Vomiting started today which is all been clear he is still having bowel movements and passing gas.  He has had chills at home but no documented fevers.  Denies any URI symptoms UTI symptoms, chest pain, shortness of breath.  He feels like is all upper abdominal pain but also radiating to his mid back.  He has had no melena no hematochezia.  He denies drinking alcohol or smoking cigarettes.   Abdominal Pain      Home Medications Prior to Admission medications   Medication Sig Start Date End Date Taking? Authorizing Provider  acetaminophen (TYLENOL) 500 MG tablet Take 1,000 mg by mouth every 6 (six) hours as needed for mild pain.    [provider]  albuterol (VENTOLIN HFA) 108 (90 Base) MCG/ACT inhaler Inhale 2 puffs into the lungs every 6 (six) hours as needed for wheezing or shortness of breath.    [provider]  benzonatate (TESSALON) 200 MG capsule Take 200 mg by mouth 3 (three) times daily as needed for cough. 01/15/22   [provider]  cetirizine (ZYRTEC) 10 MG tablet Take 10 mg by mouth daily as needed for allergies. 01/15/22   [provider]   famotidine (PEPCID) 20 MG tablet Take 1 tablet (20 mg total) by mouth 2 (two) times daily. 09/26/21 03/25/22  Barb Merino, MD  feeding supplement (ENSURE ENLIVE / ENSURE PLUS) LIQD Take 237 mLs by mouth 2 (two) times daily between meals. Patient taking differently: Take 237 mLs by mouth See admin instructions. Drink 237 ml by mouth 2-3 times a day between meals 02/04/21   Dahal, Marlowe Aschoff, MD  fluticasone (FLONASE) 50 MCG/ACT nasal spray Place 1 spray into both nostrils daily as needed for allergies. 01/15/22   [provider]  gabapentin (NEURONTIN) 100 MG capsule Take 1 capsule (100 mg total) by mouth 3 (three) times daily. 10/27/21   Lavina Hamman, MD  Naphazoline-Pheniramine (VISINE-A OP) Place 2 drops into both eyes daily.    [provider]  ondansetron (ZOFRAN-ODT) 4 MG disintegrating tablet Take 1 tablet (4 mg total) by mouth every 8 (eight) hours as needed for nausea or vomiting. 12/24/21   Tedd Sias, PA  oxyCODONE-acetaminophen (PERCOCET/ROXICET) 5-325 MG tablet Take 1 tablet by mouth every 6 (six) hours as needed for severe pain. 12/24/21   Fondaw, Kathleene Hazel, PA  Pancrelipase, Lip-Prot-Amyl, (PANCREAZE) [phone] units CPEP Take 1 capsule by mouth with breakfast, with lunch, and with evening meal.    [provider]  telmisartan (MICARDIS) 40 MG tablet Take 1 tablet (40 mg total) by mouth daily. 04/26/21 01/24/22  Idamae Schuller, MD  Allergies    Diclofenac, Norvasc [amlodipine besylate], Omeprazole, Hydrochlorothiazide, and Prednisone    Review of Systems   Review of Systems  Gastrointestinal:  Positive for abdominal pain.    Physical Exam Updated Vital Signs BP (!) 154/99   Pulse 81   Temp 98.9 F (37.2 C) (Oral)   Resp 18   Ht '6\' 1"'$  (1.854 m)   Wt 72.6 kg   SpO2 95%   BMI 21.11 kg/m  Physical Exam Constitutional: Alert and oriented.  Uncomfortable but nontoxic Eyes: Conjunctivae are normal. ENT      Head: Normocephalic and  atraumatic.      Nose: No congestion.      Mouth/Throat: Mucous membranes are moist.      Neck: No stridor. Cardiovascular: S1, S2,  Normal and symmetric distal pulses are present in all extremities.Warm and well perfused. Respiratory: Normal respiratory effort. Breath sounds are normal. Gastrointestinal: Soft and abdominal tenderness without rebound or guarding, negative Murphy sign Musculoskeletal: Normal range of motion in all extremities. No pitting edema of lower extremities Neurologic: Normal speech and language.  No facial droop.  Sensation grossly intact, moving all extremities equally.  No gross focal neurologic deficits are appreciated. Skin: Skin is warm, dry and intact. No rash noted. Psychiatric: Mood and affect are normal. Speech and behavior are normal.  ED Results / Procedures / Treatments   Labs (all labs ordered are listed, but only abnormal results are displayed) Labs Reviewed  LIPASE, BLOOD - Abnormal; Notable for the following components:      Result Value   Lipase 53 (*)    All other components within normal limits  CBC WITH DIFFERENTIAL/PLATELET - Abnormal; Notable for the following components:   RBC 3.73 (*)    Hemoglobin 12.8 (*)    HCT 37.7 (*)    MCV 101.1 (*)    MCH 34.3 (*)    All other components within normal limits  COMPREHENSIVE METABOLIC PANEL - Abnormal; Notable for the following components:   Glucose, Bld 116 (*)    Creatinine, Ser 1.40 (*)    Calcium 8.8 (*)    Total Protein 6.4 (*)    Albumin 3.3 (*)    AST 81 (*)    ALT 50 (*)    All other components within normal limits  ETHANOL  URINALYSIS, ROUTINE W REFLEX MICROSCOPIC  TROPONIN I (HIGH SENSITIVITY)  TROPONIN I (HIGH SENSITIVITY)    EKG EKG Interpretation  Date/Time:  Monday March 05 2022 09:04:50 EST Ventricular Rate:  89 PR Interval:  162 QRS Duration: 84 QT Interval:  344 QTC Calculation: 419 R Axis:   32 Text Interpretation: Sinus rhythm RSR' in V1 or V2, probably  normal variant Abnrm T, consider ischemia, anterolateral lds ST elevation, consider inferior injury Baseline wander in lead(s) V2 V4 Limited by motion artifact Confirmed by Georgina Snell [phone]) on 03/05/2022 9:15:23 AM  Radiology CT ABDOMEN PELVIS W CONTRAST  Result Date: 03/05/2022 CLINICAL DATA:  Abdominal pain and vomiting. History of chronic pancreatitis. Minimally elevated serum lipase levels. EXAM: CT ABDOMEN AND PELVIS WITH CONTRAST TECHNIQUE: Multidetector CT imaging of the abdomen and pelvis was performed using the standard protocol following bolus administration of intravenous contrast. RADIATION DOSE REDUCTION: This exam was performed according to the departmental dose-optimization program which includes automated exposure control, adjustment of the mA and/or kV according to patient size and/or use of iterative reconstruction technique. CONTRAST:  69m OMNIPAQUE IOHEXOL 350 MG/ML SOLN COMPARISON:  Abdominopelvic CT 01/23/2022 and 12/24/2021. FINDINGS: Lower chest:  Mild subsegmental atelectasis at both lung bases. No significant pleural or pericardial effusion. Hepatobiliary: Subjective mild hepatic steatosis, as before. No suspicious focal hepatic abnormality. Status post cholecystectomy, without significant biliary dilatation. Pancreas: Stable punctate calcification within the uncinate process and mild soft tissue stranding around the pancreatic head. No pancreatic ductal dilatation, hemorrhage, necrosis or surrounding fluid collection. Spleen: Normal in size without focal abnormality. Adrenals/Urinary Tract: Both adrenal glands appear normal. Unchanged small nonobstructing calculi in the lower pole of the left kidney. Nonfunctional right kidney with severe atrophy and small cysts are unchanged; no specific follow-up imaging recommended. No evidence of ureteral calculus or hydronephrosis. The bladder appears unremarkable for its degree of distention. Stomach/Bowel: No enteric contrast  administered. The stomach appears unremarkable for its degree of distension. No evidence of bowel wall thickening, distention or surrounding inflammatory change. The appendix appears normal. Mild colonic diverticulosis, greatest proximally. Vascular/Lymphatic: There are no enlarged abdominal or pelvic lymph nodes. No significant vascular findings. Reproductive: The prostate gland and seminal vesicles appear unremarkable. Other: No evidence of abdominal wall mass or hernia. No ascites. Musculoskeletal: No acute or significant osseous findings. Chronic degenerative disc disease at L5-S1. The right sacroiliac joint is partially ankylosed. IMPRESSION: 1. Stable appearance of the pancreas with mild soft tissue stranding around the pancreatic head and a punctate calcification in the uncinate process, consistent with mild acute on chronic pancreatitis. No evidence of pancreatic hemorrhage or surrounding fluid collection. 2. No other acute findings or significant changes from previous studies. 3. Nonobstructing left renal calculi. Nonfunctional right kidney. 4. Mild colonic diverticulosis. Electronically Signed   By: Richardean Sale M.D.   On: 03/05/2022 12:11    Procedures Procedures  Remain on constant cardiac monitoring which I personally reviewed normal sinus rhythm.  Medications Ordered in ED Medications  ondansetron (ZOFRAN) injection 4 mg (has no administration in time range)  lactated ringers bolus 1,000 mL (0 mLs Intravenous Stopped 03/05/22 1045)  HYDROmorphone (DILAUDID) injection 1 mg (1 mg Intravenous Given 03/05/22 0852)  ondansetron (ZOFRAN) injection 4 mg (4 mg Intravenous Given 03/05/22 0852)  HYDROmorphone (DILAUDID) injection 1 mg (1 mg Intravenous Given 03/05/22 1005)  lactated ringers bolus 1,000 mL (1,000 mLs Intravenous Not Given 03/05/22 1059)  iohexol (OMNIPAQUE) 350 MG/ML injection 75 mL (75 mLs Intravenous Contrast Given 03/05/22 1152)    ED Course/ Medical Decision Making/  A&P Clinical Course as of 03/05/22 1246  Mon Mar 05, 2022  1245 Discussed with Dr. Lorin Mercy on-call hospitalist will be down to eval the patient and put in orders for admission. [VB]    Clinical Course User Index [VB] Elgie Congo, MD                           Medical Decision Making Kyle Berry is a 51 y.o. male.  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  Based on patient's history and presentation, suspect most likely acute on chronic pancreatitis but also consider possible enteritis versus GERD versus PUD versus atypical ACS.  No concern for dissection with no focal neurologic deficits and no pulse deficits.  unlikely gallbladder etiology due to history of cholecystectomy.  Labs obtained which I personally reviewed which showed mild AKI creatinine 1.4 from baseline ranging 0.7-1, mild transaminitis AST 81 ALT  50 with slightly elevated lipase 53.  Normal white blood cell count 6.3.  Macrocytic anemia 12.8 hemoglobin consistent with baseline.  Troponin 5 and reassuring, unlikely atypical ACS.  CTAP with IV contrast obtained which I personally reviewed which showed acute on chronic pancreatitis.  No abscess, no necrotizing findings.  Discussed with Dr. Lorin Mercy on-call hospitalist will be down to eval the patient and put in orders for admission.  Amount and/or Complexity of Data Reviewed Labs: ordered. Radiology: ordered. ECG/medicine tests: ordered.  Risk Prescription drug management. Decision regarding hospitalization.    Final Clinical Impression(s) / ED Diagnoses Final diagnoses:  Abdominal pain, unspecified abdominal location  Transaminitis  AKI (acute kidney injury) ([place])  Acute pancreatitis, unspecified complication  status, unspecified pancreatitis type    Rx / DC Orders ED Discharge Orders     None         Elgie Congo, MD 03/05/22 1246

## 2022-03-06 DIAGNOSIS — K861 Other chronic pancreatitis: Secondary | ICD-10-CM | POA: Diagnosis present

## 2022-03-06 DIAGNOSIS — G8929 Other chronic pain: Secondary | ICD-10-CM | POA: Diagnosis present

## 2022-03-06 DIAGNOSIS — R109 Unspecified abdominal pain: Secondary | ICD-10-CM

## 2022-03-06 DIAGNOSIS — K859 Acute pancreatitis without necrosis or infection, unspecified: Secondary | ICD-10-CM | POA: Diagnosis present

## 2022-03-06 DIAGNOSIS — K3184 Gastroparesis: Secondary | ICD-10-CM | POA: Diagnosis present

## 2022-03-06 DIAGNOSIS — Q600 Renal agenesis, unilateral: Secondary | ICD-10-CM | POA: Diagnosis not present

## 2022-03-06 DIAGNOSIS — N179 Acute kidney failure, unspecified: Secondary | ICD-10-CM | POA: Diagnosis present

## 2022-03-06 DIAGNOSIS — N261 Atrophy of kidney (terminal): Secondary | ICD-10-CM | POA: Diagnosis present

## 2022-03-06 DIAGNOSIS — R7401 Elevation of levels of liver transaminase levels: Secondary | ICD-10-CM

## 2022-03-06 DIAGNOSIS — Z841 Family history of disorders of kidney and ureter: Secondary | ICD-10-CM | POA: Diagnosis not present

## 2022-03-06 DIAGNOSIS — Z833 Family history of diabetes mellitus: Secondary | ICD-10-CM | POA: Diagnosis not present

## 2022-03-06 DIAGNOSIS — E7800 Pure hypercholesterolemia, unspecified: Secondary | ICD-10-CM | POA: Diagnosis present

## 2022-03-06 DIAGNOSIS — K219 Gastro-esophageal reflux disease without esophagitis: Secondary | ICD-10-CM | POA: Diagnosis present

## 2022-03-06 DIAGNOSIS — M545 Low back pain, unspecified: Secondary | ICD-10-CM | POA: Diagnosis present

## 2022-03-06 DIAGNOSIS — E876 Hypokalemia: Secondary | ICD-10-CM | POA: Diagnosis present

## 2022-03-06 DIAGNOSIS — Z8249 Family history of ischemic heart disease and other diseases of the circulatory system: Secondary | ICD-10-CM | POA: Diagnosis not present

## 2022-03-06 DIAGNOSIS — Z888 Allergy status to other drugs, medicaments and biological substances status: Secondary | ICD-10-CM | POA: Diagnosis not present

## 2022-03-06 DIAGNOSIS — I10 Essential (primary) hypertension: Secondary | ICD-10-CM | POA: Diagnosis present

## 2022-03-06 LAB — CBC
HCT: 36.6 % — ABNORMAL LOW (ref 39.0–52.0)
Hemoglobin: 12.8 g/dL — ABNORMAL LOW (ref 13.0–17.0)
MCH: 34.2 pg — ABNORMAL HIGH (ref 26.0–34.0)
MCHC: 35 g/dL (ref 30.0–36.0)
MCV: 97.9 fL (ref 80.0–100.0)
Platelets: 160 10*3/uL (ref 150–400)
RBC: 3.74 MIL/uL — ABNORMAL LOW (ref 4.22–5.81)
RDW: 13.3 % (ref 11.5–15.5)
WBC: 6.9 10*3/uL (ref 4.0–10.5)
nRBC: 0 % (ref 0.0–0.2)

## 2022-03-06 LAB — COMPREHENSIVE METABOLIC PANEL
ALT: 42 U/L (ref 0–44)
AST: 62 U/L — ABNORMAL HIGH (ref 15–41)
Albumin: 3.3 g/dL — ABNORMAL LOW (ref 3.5–5.0)
Alkaline Phosphatase: 63 U/L (ref 38–126)
Anion gap: 13 (ref 5–15)
BUN: <5 mg/dL — ABNORMAL LOW (ref 6–20)
CO2: 27 mmol/L (ref 22–32)
Calcium: 8.8 mg/dL — ABNORMAL LOW (ref 8.9–10.3)
Chloride: 96 mmol/L — ABNORMAL LOW (ref 98–111)
Creatinine, Ser: 1.16 mg/dL (ref 0.61–1.24)
GFR, Estimated: >60 mL/min (ref >60)
Glucose, Bld: 120 mg/dL — ABNORMAL HIGH (ref 70–99)
Potassium: 3.1 mmol/L — ABNORMAL LOW (ref 3.5–5.1)
Sodium: 136 mmol/L (ref 135–145)
Total Bilirubin: 1.2 mg/dL (ref 0.3–1.2)
Total Protein: 6.4 g/dL — ABNORMAL LOW (ref 6.5–8.1)

## 2022-03-06 MED ORDER — SODIUM CHLORIDE 0.9 % IV SOLN: INTRAVENOUS | Status: DC

## 2022-03-06 MED ORDER — ONDANSETRON HCL 4 MG PO TABS: 4.0000 mg | ORAL_TABLET | Freq: Four times a day (QID) | ORAL | Status: DC | PRN

## 2022-03-06 MED ORDER — SODIUM CHLORIDE 0.9 % IV SOLN: 8.0000 mg | Freq: Four times a day (QID) | INTRAVENOUS | Status: DC | PRN

## 2022-03-06 MED ORDER — HYDROMORPHONE HCL 1 MG/ML IJ SOLN
1.0000 mg | INTRAMUSCULAR | Status: DC | PRN
  Administered 2022-03-06 – 2022-03-07 (×8): 1 mg via INTRAVENOUS
  Filled 2022-03-06 (×8): qty 1

## 2022-03-06 MED ORDER — MORPHINE SULFATE (PF) 4 MG/ML IV SOLN
4.0000 mg | INTRAVENOUS | Status: DC | PRN
  Administered 2022-03-06: 4 mg via INTRAVENOUS
  Filled 2022-03-06: qty 1

## 2022-03-06 MED ORDER — POTASSIUM CHLORIDE IN NACL 20-0.45 MEQ/L-% IV SOLN
INTRAVENOUS | Status: DC
  Filled 2022-03-06 (×4): qty 1000

## 2022-03-06 NOTE — Progress Notes (Addendum)
[facility] PROGRESS NOTE    Progress Note  Kyle Berry  DZH:[id number] DOB: [birth year] DOA: 03/05/2022 PCP: [facility]     Brief Narrative:   Kyle Berry is an 51 y.o. male past medical history significant for chronic pancreatitis with a stent in place pancreatic devise him solitary kidney comes in for abdominal pain nausea and vomiting that started several days prior to admission, he was recently discharged on 01/26/2022 for an exacerbation of his pancreatitis he relates he did well until several days ago.  Significant decreased urine output.    Assessment/Plan:   Acute on chronic pancreatitis [facility]): h/o pancreas divisum and s/p ERCP with biliary duct sphincterotomy and pancreatic duct placement at [facility] (removed on 7/20) . CT of the head showed acute mild pancreatitis in the head of the pancreas. Placed n.p.o. started on IV fluids and IV narcotics.  Relates his pain is about the same increase narcotics. Urine is still concentrated increase IV fluids. He is positive about 1.7 L. Mild LFT elevations, which are improving this morning.  Acute kidney injury (solitary kidney): With a baseline creatinine around 1, on admission 1.4 he was started on aggressive IV fluid hydration, has improved likely prerenal azotemia due to nausea and vomiting in the setting of ARB use.  Essential hypertension: Continue to hold ACE inhibitor hydralazine IV as needed.  Narcotics Seeking behavior: Noted.  Hyperlipidemia: Noted.  DVT prophylaxis: lovenox Family Communication:none Status is: Observation The patient will require care spanning > 2 midnights and should be moved to inpatient because: Acute pancreatitis    Code Status:     Code Status Orders  (From admission, onward)           Start     Ordered   03/05/22 1308  Full code  Continuous        03/05/22 1308           Code Status History     Date Active Date Inactive Code  Status Order ID Comments User Context   01/24/2022 0916 01/27/2022 0552 Full Code [id number]  Karmen Bongo, MD ED   10/25/2021 1558 10/27/2021 1759 Full Code [id number]  George Hugh, MD ED   09/19/2021 1543 09/26/2021 1417 Full Code [id number]  Norval Morton, MD ED   08/09/2021 1542 08/12/2021 1837 Full Code [id number]  Masters, [place], [place] ED   06/21/2021 1847 06/24/2021 2155 Full Code [id number]  Geradine Girt, DO Inpatient   04/21/2021 2011 04/27/2021 0156 Full Code [id number]  Jose Persia, MD ED   02/01/2021 0535 02/04/2021 1952 Full Code [id number]  Kristopher Oppenheim, DO ED   02/01/2021 0506 02/01/2021 0535 Full Code [id number]  Kristopher Oppenheim, DO ED   12/23/2020 1327 12/31/2020 1856 Full Code [id number]  Karmen Bongo, MD ED   11/17/2020 0358 11/18/2020 2054 Full Code [id number]  Etta Quill, DO ED   10/31/2020 0551 11/03/2020 1951 Full Code [id number]  Kayleen Memos, DO ED   05/29/2020 0959 06/03/2020 1627 Full Code [id number]  Harvie Heck, MD ED   04/12/2020 1559 04/15/2020 2219 Full Code [id number]  Lequita Halt, MD ED   01/29/2020 0815 02/02/2020 1646 Full Code [id number]  Karmen Bongo, MD ED   11/05/2019 1940 11/06/2019 2215 Full Code [id number]  Lenore Cordia, MD ED   11/02/2019 0323 11/02/2019 2012 Full Code [id number]  Vianne Bulls, MD ED   10/14/2019 1637 10/16/2019 1724 Full Code [id number]  Darliss Cheney, MD ED   08/21/2019 0540 08/28/2019  1838 Full Code [id number]  Rise Patience, MD ED   05/27/2019 1300 06/03/2019 1717 Full Code [id number]  Lequita Halt, MD ED   10/17/2018 [phone] 10/24/2018 1854 Full Code [id number]  Shela Leff, MD Inpatient   07/23/2018 0540 07/27/2018 1608 Full Code [id number]  Shela Leff, MD ED   04/06/2018 0908 04/13/2018 1441 Full Code [id number]  Katherine Roan, MD ED   10/18/2017 2315 10/24/2017 1541 Full Code [id number]  Toy Baker, MD Inpatient   10/12/2017 0002 10/14/2017 2205 Full Code [id number]  Ivor Costa, MD ED   08/25/2017 1514 08/29/2017 1631 Full Code  [id number]  Lorella Nimrod, MD ED   05/13/2017 0756 05/16/2017 1607 Full Code [id number]  Samella Parr, NP ED   01/17/2017 0455 01/20/2017 1643 Full Code [id number]  Etta Quill, DO ED   11/06/2016 1436 11/09/2016 1833 Full Code [id number]  Nicolette Bang, DO ED   08/25/2016 1851 08/30/2016 2114 Full Code [id number]  Mercy Riding, MD Inpatient   06/25/2016 0005 06/27/2016 1713 Full Code [id number]  Rise Patience, MD ED   11/23/2015 1901 11/26/2015 1750 Full Code [id number]  Smiley Houseman, MD Inpatient         IV Access:   Peripheral IV   Procedures and diagnostic studies:   CT ABDOMEN PELVIS W CONTRAST  Result Date: 03/05/2022 CLINICAL DATA:  Abdominal pain and vomiting. History of chronic pancreatitis. Minimally elevated serum lipase levels. EXAM: CT ABDOMEN AND PELVIS WITH CONTRAST TECHNIQUE: Multidetector CT imaging of the abdomen and pelvis was performed using the standard protocol following bolus administration of intravenous contrast. RADIATION DOSE REDUCTION: This exam was performed according to the departmental dose-optimization program which includes automated exposure control, adjustment of the mA and/or kV according to patient size and/or use of iterative reconstruction technique. CONTRAST:  68m OMNIPAQUE IOHEXOL 350 MG/ML SOLN COMPARISON:  Abdominopelvic CT 01/23/2022 and 12/24/2021. FINDINGS: Lower chest: Mild subsegmental atelectasis at both lung bases. No significant pleural or pericardial effusion. Hepatobiliary: Subjective mild hepatic steatosis, as before. No suspicious focal hepatic abnormality. Status post cholecystectomy, without significant biliary dilatation. Pancreas: Stable punctate calcification within the uncinate process and mild soft tissue stranding around the pancreatic head. No pancreatic ductal dilatation, hemorrhage, necrosis or surrounding fluid collection. Spleen: Normal in size without focal abnormality. Adrenals/Urinary Tract: Both adrenal  glands appear normal. Unchanged small nonobstructing calculi in the lower pole of the left kidney. Nonfunctional right kidney with severe atrophy and small cysts are unchanged; no specific follow-up imaging recommended. No evidence of ureteral calculus or hydronephrosis. The bladder appears unremarkable for its degree of distention. Stomach/Bowel: No enteric contrast administered. The stomach appears unremarkable for its degree of distension. No evidence of bowel wall thickening, distention or surrounding inflammatory change. The appendix appears normal. Mild colonic diverticulosis, greatest proximally. Vascular/Lymphatic: There are no enlarged abdominal or pelvic lymph nodes. No significant vascular findings. Reproductive: The prostate gland and seminal vesicles appear unremarkable. Other: No evidence of abdominal wall mass or hernia. No ascites. Musculoskeletal: No acute or significant osseous findings. Chronic degenerative disc disease at L5-S1. The right sacroiliac joint is partially ankylosed. IMPRESSION: 1. Stable appearance of the pancreas with mild soft tissue stranding around the pancreatic head and a punctate calcification in the uncinate process, consistent with mild acute on chronic pancreatitis. No evidence of pancreatic hemorrhage or surrounding fluid collection. 2. No other acute findings or significant changes from previous studies. 3. Nonobstructing left renal calculi. Nonfunctional right kidney. 4. Mild colonic diverticulosis. Electronically Signed  By: Richardean Sale M.D.   On: 03/05/2022 12:11     Medical Consultants:   None.   Subjective:    AMIRR ACHORD relates his pain has not improved, continues to have nausea and vomiting.  Objective:    Vitals:   03/05/22 1530 03/05/22 1730 03/05/22 1846 03/05/22 2147  BP: (!) 144/98 (!) 153/98 (!) 150/98 (!) 157/94  Pulse: 75 78 76 76  Resp: '16 16 16   '$ Temp: 98.9 F (37.2 C)  98.4 F (36.9 C) 98.8 F (37.1 C)  TempSrc:    Oral Oral  SpO2: 95% 97% 98% 99%  Weight:      Height:       SpO2: 99 %   Intake/Output Summary (Last 24 hours) at 03/06/2022 0730 Last data filed at 03/06/2022 0400 Gross per 24 hour  Intake 1766.67 ml  Output --  Net 1766.67 ml   Filed Weights   03/05/22 0817  Weight: 72.6 kg    Exam: General exam: In no acute distress. Respiratory system: Good air movement and clear to auscultation. Cardiovascular system: S1 & S2 heard, RRR. No JVD. Gastrointestinal system: Abdomen is nondistended, soft and nontender.  Extremities: No pedal edema. Skin: No rashes, lesions or ulcers Psychiatry: Judgement and insight appear normal. Mood & affect appropriate.    Data Reviewed:    Labs: Basic Metabolic Panel: Recent Labs  Lab 03/05/22 0900 03/06/22 0304  NA 137 136  K 3.6 3.1*  CL 103 96*  CO2 22 27  GLUCOSE 116* 120*  BUN 6 <5*  CREATININE 1.40* 1.16  CALCIUM 8.8* 8.8*   GFR Estimated Creatinine Clearance: 76.5 mL/min (by C-G formula based on SCr of 1.16 mg/dL). Liver Function Tests: Recent Labs  Lab 03/05/22 0900 03/06/22 0304  AST 81* 62*  ALT 50* 42  ALKPHOS 67 63  BILITOT 0.6 1.2  PROT 6.4* 6.4*  ALBUMIN 3.3* 3.3*   Recent Labs  Lab 03/05/22 0900  LIPASE 53*   No results for input(s): "AMMONIA" in the last 168 hours. Coagulation profile No results for input(s): "INR", "PROTIME" in the last 168 hours. COVID-19 Labs  No results for input(s): "DDIMER", "FERRITIN", "LDH", "CRP" in the last 72 hours.  Lab Results  Component Value Date   SARSCOV2NAA NEGATIVE 04/21/2021   SARSCOV2NAA NEGATIVE 02/01/2021   SARSCOV2NAA NEGATIVE 12/23/2020   SARSCOV2NAA POSITIVE (A) 11/17/2020    CBC: Recent Labs  Lab 03/05/22 0900 03/06/22 0304  WBC 6.3 6.9  NEUTROABS 3.5  --   HGB 12.8* 12.8*  HCT 37.7* 36.6*  MCV 101.1* 97.9  PLT 160 160   Cardiac Enzymes: No results for input(s): "CKTOTAL", "CKMB", "CKMBINDEX", "TROPONINI" in the last 168 hours. BNP (last 3  results) No results for input(s): "PROBNP" in the last 8760 hours. CBG: No results for input(s): "GLUCAP" in the last 168 hours. D-Dimer: No results for input(s): "DDIMER" in the last 72 hours. Hgb A1c: No results for input(s): "HGBA1C" in the last 72 hours. Lipid Profile: No results for input(s): "CHOL", "HDL", "LDLCALC", "TRIG", "CHOLHDL", "LDLDIRECT" in the last 72 hours. Thyroid function studies: No results for input(s): "TSH", "T4TOTAL", "T3FREE", "THYROIDAB" in the last 72 hours.  Invalid input(s): "FREET3" Anemia work up: No results for input(s): "VITAMINB12", "FOLATE", "FERRITIN", "TIBC", "IRON", "RETICCTPCT" in the last 72 hours. Sepsis Labs: Recent Labs  Lab 03/05/22 0900 03/06/22 0304  WBC 6.3 6.9   Microbiology No results found for this or any previous visit (from the past 240 hour(s)).   Medications:  gabapentin  100 mg Oral TID   Continuous Infusions:  lactated ringers 200 mL/hr at 03/05/22 1509      LOS: 0 days   Charlynne Cousins  [facility]  03/06/2022, 7:30 AM

## 2022-03-07 DIAGNOSIS — Q600 Renal agenesis, unilateral: Secondary | ICD-10-CM

## 2022-03-07 DIAGNOSIS — I10 Essential (primary) hypertension: Secondary | ICD-10-CM

## 2022-03-07 LAB — BASIC METABOLIC PANEL
Anion gap: 9 (ref 5–15)
BUN: <5 mg/dL — ABNORMAL LOW (ref 6–20)
CO2: 29 mmol/L (ref 22–32)
Calcium: 8.9 mg/dL (ref 8.9–10.3)
Chloride: 97 mmol/L — ABNORMAL LOW (ref 98–111)
Creatinine, Ser: 1.15 mg/dL (ref 0.61–1.24)
GFR, Estimated: >60 mL/min (ref >60)
Glucose, Bld: 125 mg/dL — ABNORMAL HIGH (ref 70–99)
Potassium: 3.3 mmol/L — ABNORMAL LOW (ref 3.5–5.1)
Sodium: 135 mmol/L (ref 135–145)

## 2022-03-07 MED ORDER — POTASSIUM CHLORIDE CRYS ER 20 MEQ PO TBCR
40.0000 meq | EXTENDED_RELEASE_TABLET | Freq: Once | ORAL | Status: AC
  Administered 2022-03-07: 40 meq via ORAL
  Filled 2022-03-07: qty 2

## 2022-03-07 MED ORDER — TELMISARTAN 40 MG PO TABS: 40.0000 mg | ORAL_TABLET | Freq: Every day | ORAL | 0 refills | Status: DC

## 2022-03-07 NOTE — Plan of Care (Signed)

## 2022-03-07 NOTE — TOC Transition Note (Signed)
Transition of Care [facility]) - CM/SW Discharge Note   Patient Details  Name: Kyle Berry MRN: [id number] Date of Birth: Jan 23, 1971  [facility] CM/SW Contact:  Tom-Johnson, Renea Ee, RN Phone Number: 03/07/2022, 3:48 PM   Clinical Narrative:     Patient is scheduled for discharge today. No TOC needs or recommendations noted. Family to transport at discharge. No further TOC needs noted.  Final next level of care: Home/Self Care Barriers to Discharge: Barriers Resolved   Patient Goals and CMS Choice Patient states their goals for this hospitalization and ongoing recovery are:: To return home CMS Medicare.gov Compare Post Acute Care list provided to:: Patient Choice offered to / list presented to : NA  Discharge Placement                Patient to be transferred to facility by: Family      Discharge Plan and Services                DME Arranged: N/A DME Agency: NA       HH Arranged: NA HH Agency: NA        Social Determinants of Health (SDOH) Interventions     Readmission Risk Interventions    11/03/2020    2:19 PM  Readmission Risk Prevention Plan  Transportation Screening Complete  PCP or Specialist Appt within 3-5 Days Complete  HRI or [place] Complete  Social Work Consult for [place] Planning/Counseling Complete  Palliative Care Screening Not Applicable

## 2022-03-07 NOTE — Progress Notes (Signed)
Nsg Discharge Note  Admit Date:  03/05/2022 Discharge date: 03/07/2022   Kyle Berry to be D/C'd Home per MD order.  AVS completed. Patient/caregiver able to verbalize understanding.  Discharge Medication: Allergies as of 03/07/2022       Reactions   Diclofenac Hives, Other (See Comments)   Fluid buildup in chest   Norvasc [amlodipine Besylate] Other (See Comments)   Fluid buildup in chest   Omeprazole Other (See Comments)   MD stopped due to pancreatitis   Hydrochlorothiazide    07/31/2016 -  07/20/2016 -  05/28/2016 -   Other Reaction(s) from [place]: itching   Prednisone Hives, Other (See Comments)   Mood swings        Medication List     STOP taking these medications    acetaminophen 500 MG tablet Commonly known as: TYLENOL       TAKE these medications    albuterol 108 (90 Base) MCG/ACT inhaler Commonly known as: VENTOLIN HFA Inhale 2 puffs into the lungs every 6 (six) hours as needed for wheezing or shortness of breath.   cetirizine 10 MG tablet Commonly known as: ZYRTEC Take 10 mg by mouth daily as needed for allergies.   famotidine 20 MG tablet Commonly known as: PEPCID Take 1 tablet (20 mg total) by mouth 2 (two) times daily.   feeding supplement Liqd Take 237 mLs by mouth 2 (two) times daily between meals. What changed:  when to take this additional instructions   fluticasone 50 MCG/ACT nasal spray Commonly known as: FLONASE Place 1 spray into both nostrils daily as needed for allergies.   gabapentin 100 MG capsule Commonly known as: NEURONTIN Take 1 capsule (100 mg total) by mouth 3 (three) times daily.   ondansetron 4 MG disintegrating tablet Commonly known as: ZOFRAN-ODT Take 1 tablet (4 mg total) by mouth every 8 (eight) hours as needed for nausea or vomiting.   oxyCODONE-acetaminophen 5-325 MG tablet Commonly known as: PERCOCET/ROXICET Take 1 tablet by mouth every 6 (six) hours as needed for severe pain.   Pancreaze  21000-54700 units Cpep Generic drug: Pancrelipase (Lip-Prot-Amyl) Take 1 capsule by mouth with breakfast, with lunch, and with evening meal.   telmisartan 40 MG tablet Commonly known as: MICARDIS Take 1 tablet (40 mg total) by mouth daily.   VISINE-A OP Place 2 drops into both eyes daily as needed (itchy or irritated eyes).        Discharge Assessment: Vitals:   03/07/22 0549 03/07/22 0738  BP: (!) 131/90 (!) 152/108  Pulse: 71 76  Resp:  16  Temp: (!) 97.5 F (36.4 C) 98.2 F (36.8 C)  SpO2: 98% 99%   Skin clean, dry and intact without evidence of skin break down, no evidence of skin tears noted. IV catheter discontinued intact. Site without signs and symptoms of complications - no redness or edema noted at insertion site, patient denies c/o pain - only slight tenderness at site.  Dressing with slight pressure applied.  D/c Instructions-Education: Discharge instructions given to patient/family with verbalized understanding. D/c education completed with patient/family including follow up instructions, medication list, d/c activities limitations if indicated, with other d/c instructions as indicated by MD - patient able to verbalize understanding, all questions fully answered. Patient instructed to return to ED, call 911, or call MD for any changes in condition.  Patient escorted via [place], and D/C home via private auto.  Atilano Ina, RN 03/07/2022 2:50 PM

## 2022-03-07 NOTE — Discharge Summary (Signed)
Physician Discharge Summary   Patient: Kyle Berry MRN: [id number] DOB: 1970-10-20  Admit date:     03/05/2022  Discharge date: 03/07/22  Discharge Physician: Oswald Hillock   PCP: [facility]   Recommendations at discharge:   Follow-up PCP in 1 week  Discharge Diagnoses: Principal Problem:   Acute on chronic pancreatitis ([place]) Active Problems:   Essential hypertension   Congenital single kidney   High cholesterol   AKI (acute kidney injury) ([place])   Acute pancreatitis  Resolved Problems:   * No resolved hospital problems. *  Hospital Course:  51 y.o. male past medical history significant for chronic pancreatitis with a stent in place pancreatic devise him solitary kidney comes in for abdominal pain nausea and vomiting that started several days prior to admission, he was recently discharged on 01/26/2022 for an exacerbation of his pancreatitis he relates he did well until several days ago.  Significant decreased urine output   Assessment and Plan:  Acute on chronic pancreatitis [facility]): h/o pancreas divisum and s/p ERCP with biliary duct sphincterotomy and pancreatic duct placement at [facility] (removed on 7/20) . CT of the head showed acute mild pancreatitis in the head of the pancreas. Placed n.p.o. started on IV fluids and IV narcotics.  Relates his pain is about the same increase narcotics. -Pain has significantly improved -Diet was changed to heart healthy diet which patient tolerated well   Acute kidney injury (solitary kidney): With a baseline creatinine around 1, on admission 1.4 he was started on aggressive IV fluid hydration, has improved likely prerenal azotemia due to nausea and vomiting in the setting of ARB use.    Essential hypertension: Continue telmisartan   Hypokalemia -Potassium is 3.3 this morning -Replace potassium with K-Dur 40 meq p.o. x1 before discharge       Consultants:  Procedures performed:  Disposition:  Home Diet recommendation:  Discharge Diet Orders (From admission, onward)     Start     Ordered   03/07/22 0000  Diet - low sodium heart healthy        03/07/22 1354           Regular diet DISCHARGE MEDICATION: Allergies as of 03/07/2022       Reactions   Diclofenac Hives, Other (See Comments)   Fluid buildup in chest   Norvasc [amlodipine Besylate] Other (See Comments)   Fluid buildup in chest   Omeprazole Other (See Comments)   MD stopped due to pancreatitis   Hydrochlorothiazide    07/31/2016 -  07/20/2016 -  05/28/2016 -   Other Reaction(s) from [place]: itching   Prednisone Hives, Other (See Comments)   Mood swings        Medication List     STOP taking these medications    acetaminophen 500 MG tablet Commonly known as: TYLENOL       TAKE these medications    albuterol 108 (90 Base) MCG/ACT inhaler Commonly known as: VENTOLIN HFA Inhale 2 puffs into the lungs every 6 (six) hours as needed for wheezing or shortness of breath.   cetirizine 10 MG tablet Commonly known as: ZYRTEC Take 10 mg by mouth daily as needed for allergies.   famotidine 20 MG tablet Commonly known as: PEPCID Take 1 tablet (20 mg total) by mouth 2 (two) times daily.   feeding supplement Liqd Take 237 mLs by mouth 2 (two) times daily between meals. What changed:  when to take this additional instructions   fluticasone  50 MCG/ACT nasal spray Commonly known as: FLONASE Place 1 spray into both nostrils daily as needed for allergies.   gabapentin 100 MG capsule Commonly known as: NEURONTIN Take 1 capsule (100 mg total) by mouth 3 (three) times daily.   ondansetron 4 MG disintegrating tablet Commonly known as: ZOFRAN-ODT Take 1 tablet (4 mg total) by mouth every 8 (eight) hours as needed for nausea or vomiting.   oxyCODONE-acetaminophen 5-325 MG tablet Commonly known as: PERCOCET/ROXICET Take 1 tablet by mouth every 6 (six) hours as needed for severe pain.    Pancreaze 21000-54700 units Cpep Generic drug: Pancrelipase (Lip-Prot-Amyl) Take 1 capsule by mouth with breakfast, with lunch, and with evening meal.   telmisartan 40 MG tablet Commonly known as: MICARDIS Take 1 tablet (40 mg total) by mouth daily.   VISINE-A OP Place 2 drops into both eyes daily as needed (itchy or irritated eyes).        Discharge Exam: Filed Weights   03/05/22 0817 03/06/22 0900  Weight: 72.6 kg 72.6 kg   General-appears in no acute distress Heart-S1-S2, regular, no murmur auscultated Lungs-clear to auscultation bilaterally, no wheezing or crackles auscultated Abdomen-soft, nontender, no organomegaly Extremities-no edema in the lower extremities Neuro-alert, oriented x3, no focal deficit noted  Condition at discharge: good  The results of significant diagnostics from this hospitalization (including imaging, microbiology, ancillary and laboratory) are listed below for reference.   Imaging Studies: CT ABDOMEN PELVIS W CONTRAST  Result Date: 03/05/2022 CLINICAL DATA:  Abdominal pain and vomiting. History of chronic pancreatitis. Minimally elevated serum lipase levels. EXAM: CT ABDOMEN AND PELVIS WITH CONTRAST TECHNIQUE: Multidetector CT imaging of the abdomen and pelvis was performed using the standard protocol following bolus administration of intravenous contrast. RADIATION DOSE REDUCTION: This exam was performed according to the departmental dose-optimization program which includes automated exposure control, adjustment of the mA and/or kV according to patient size and/or use of iterative reconstruction technique. CONTRAST:  66m OMNIPAQUE IOHEXOL 350 MG/ML SOLN COMPARISON:  Abdominopelvic CT 01/23/2022 and 12/24/2021. FINDINGS: Lower chest: Mild subsegmental atelectasis at both lung bases. No significant pleural or pericardial effusion. Hepatobiliary: Subjective mild hepatic steatosis, as before. No suspicious focal hepatic abnormality. Status post  cholecystectomy, without significant biliary dilatation. Pancreas: Stable punctate calcification within the uncinate process and mild soft tissue stranding around the pancreatic head. No pancreatic ductal dilatation, hemorrhage, necrosis or surrounding fluid collection. Spleen: Normal in size without focal abnormality. Adrenals/Urinary Tract: Both adrenal glands appear normal. Unchanged small nonobstructing calculi in the lower pole of the left kidney. Nonfunctional right kidney with severe atrophy and small cysts are unchanged; no specific follow-up imaging recommended. No evidence of ureteral calculus or hydronephrosis. The bladder appears unremarkable for its degree of distention. Stomach/Bowel: No enteric contrast administered. The stomach appears unremarkable for its degree of distension. No evidence of bowel wall thickening, distention or surrounding inflammatory change. The appendix appears normal. Mild colonic diverticulosis, greatest proximally. Vascular/Lymphatic: There are no enlarged abdominal or pelvic lymph nodes. No significant vascular findings. Reproductive: The prostate gland and seminal vesicles appear unremarkable. Other: No evidence of abdominal wall mass or hernia. No ascites. Musculoskeletal: No acute or significant osseous findings. Chronic degenerative disc disease at L5-S1. The right sacroiliac joint is partially ankylosed. IMPRESSION: 1. Stable appearance of the pancreas with mild soft tissue stranding around the pancreatic head and a punctate calcification in the uncinate process, consistent with mild acute on chronic pancreatitis. No evidence of pancreatic hemorrhage or surrounding fluid collection. 2. No other acute findings  or significant changes from previous studies. 3. Nonobstructing left renal calculi. Nonfunctional right kidney. 4. Mild colonic diverticulosis. Electronically Signed   By: Richardean Sale M.D.   On: 03/05/2022 12:11    Microbiology: Results for orders placed or  performed during the hospital encounter of 04/21/21  Urine Culture     Status: None   Collection Time: 04/21/21  6:23 AM   Specimen: Urine, Clean Catch  Result Value Ref Range Status   Specimen Description URINE, CLEAN CATCH  Final   Special Requests NONE  Final   Culture   Final    NO GROWTH Performed at [facility], [place] [address] [place], [place] [address]    Report Status 04/23/2021 FINAL  Final  Resp Panel by RT-PCR (Flu A&B, Covid) Nasopharyngeal Swab     Status: None   Collection Time: 04/21/21  5:21 PM   Specimen: Nasopharyngeal Swab; Nasopharyngeal(NP) swabs in vial transport medium  Result Value Ref Range Status   SARS Coronavirus 2 by RT PCR NEGATIVE NEGATIVE Final    Comment: (NOTE) SARS-CoV-2 target nucleic acids are NOT DETECTED.  The SARS-CoV-2 RNA is generally detectable in upper respiratory specimens during the acute phase of infection. The lowest concentration of SARS-CoV-2 viral copies this assay can detect is 138 copies/mL. A negative result does not preclude SARS-Cov-2 infection and should not be used as the sole basis for treatment or other patient management decisions. A negative result may occur with  improper specimen collection/handling, submission of specimen other than nasopharyngeal swab, presence of viral mutation(s) within the areas targeted by this assay, and inadequate number of viral copies(<138 copies/mL). A negative result must be combined with clinical observations, patient history, and epidemiological information. The expected result is Negative.  Fact Sheet for Patients:  EntrepreneurPulse.com.au  Fact Sheet for Healthcare Providers:  IncredibleEmployment.be  This test is no t yet approved or cleared by the [place] FDA and  has been authorized for detection and/or diagnosis of SARS-CoV-2 by FDA under an Emergency Use Authorization (EUA). This EUA will remain  in effect (meaning this test  can be used) for the duration of the COVID-19 declaration under Section 564(b)(1) of the Act, 21 U.S.C.section 360bbb-3(b)(1), unless the authorization is terminated  or revoked sooner.       Influenza A by PCR NEGATIVE NEGATIVE Final   Influenza B by PCR NEGATIVE NEGATIVE Final    Comment: (NOTE) The Xpert Xpress SARS-CoV-2/FLU/RSV plus assay is intended as an aid in the diagnosis of influenza from Nasopharyngeal swab specimens and should not be used as a sole basis for treatment. Nasal washings and aspirates are unacceptable for Xpert Xpress SARS-CoV-2/FLU/RSV testing.  Fact Sheet for Patients: EntrepreneurPulse.com.au  Fact Sheet for Healthcare Providers: IncredibleEmployment.be  This test is not yet approved or cleared by the [place] FDA and has been authorized for detection and/or diagnosis of SARS-CoV-2 by FDA under an Emergency Use Authorization (EUA). This EUA will remain in effect (meaning this test can be used) for the duration of the COVID-19 declaration under Section 564(b)(1) of the Act, 21 U.S.C. section 360bbb-3(b)(1), unless the authorization is terminated or revoked.  Performed at [facility], [place] [address], [place], [place] [address]     Labs: CBC: Recent Labs  Lab 03/05/22 0900 03/06/22 0304  WBC 6.3 6.9  NEUTROABS 3.5  --   HGB 12.8* 12.8*  HCT 37.7* 36.6*  MCV 101.1* 97.9  PLT 160 299   Basic Metabolic Panel: Recent Labs  Lab 03/05/22 0900 03/06/22 0304 03/07/22 1240  NA 137 136 135  K 3.6 3.1* 3.3*  CL 103 96* 97*  CO2 '22 27 29  '$ GLUCOSE 116* 120* 125*  BUN 6 <5* <5*  CREATININE 1.40* 1.16 1.15  CALCIUM 8.8* 8.8* 8.9   Liver Function Tests: Recent Labs  Lab 03/05/22 0900 03/06/22 0304  AST 81* 62*  ALT 50* 42  ALKPHOS 67 63  BILITOT 0.6 1.2  PROT 6.4* 6.4*  ALBUMIN 3.3* 3.3*   CBG: No results for input(s): "GLUCAP" in the last 168 hours.  Discharge time spent: greater  than 30 minutes.  Signed: Oswald Hillock, MD [facility] 03/07/2022

## 2022-03-14 ENCOUNTER — Telehealth: Payer: Self-pay | Admitting: Cardiovascular Disease

## 2022-03-14 NOTE — Telephone Encounter (Signed)
Pt returning nurses cal regarding results. Please advise

## 2022-03-14 NOTE — Telephone Encounter (Signed)
Spoke with patient and advised sent to Dr Oval Linsey for review

## 2022-03-14 NOTE — Telephone Encounter (Signed)
Patient is calling about an abnormal EKG result he received via MyChart.

## 2022-03-14 NOTE — Telephone Encounter (Signed)
EKG: Independently reviewed.  NSR with rate 89; nonspecific ST changes with no evidence of acute ischemia per admitting physician.  Left message to call back  Mychart message to patient nothing acute but will forward to Dr Oval Linsey for review/comparison

## 2022-04-14 NOTE — Telephone Encounter (Signed)
EKG with no concerning findings. The abnormalities noted by the auto-read are related to artifact which is not of concern.   Loel Dubonnet, NP

## 2022-04-17 NOTE — Telephone Encounter (Signed)
Left message to call back  

## 2022-04-20 NOTE — Telephone Encounter (Signed)
Advised patient, verbalized understanding  

## 2022-05-04 ENCOUNTER — Other Ambulatory Visit: Payer: Self-pay

## 2022-05-04 ENCOUNTER — Inpatient Hospital Stay (HOSPITAL_COMMUNITY)
Admission: EM | Admit: 2022-05-04 | Discharge: 2022-05-11 | DRG: 439 | Disposition: A | Discharge status: Home / Self Care | Payer: Managed Care, Other (non HMO) | Attending: Internal Medicine | Admitting: Internal Medicine

## 2022-05-04 ENCOUNTER — Emergency Department (HOSPITAL_COMMUNITY): Payer: Managed Care, Other (non HMO)

## 2022-05-04 ENCOUNTER — Encounter (HOSPITAL_COMMUNITY): Payer: Self-pay

## 2022-05-04 DIAGNOSIS — K8681 Exocrine pancreatic insufficiency: Secondary | ICD-10-CM | POA: Diagnosis present

## 2022-05-04 DIAGNOSIS — K219 Gastro-esophageal reflux disease without esophagitis: Secondary | ICD-10-CM | POA: Diagnosis present

## 2022-05-04 DIAGNOSIS — N179 Acute kidney failure, unspecified: Secondary | ICD-10-CM | POA: Diagnosis not present

## 2022-05-04 DIAGNOSIS — E7800 Pure hypercholesterolemia, unspecified: Secondary | ICD-10-CM | POA: Diagnosis present

## 2022-05-04 DIAGNOSIS — K920 Hematemesis: Secondary | ICD-10-CM | POA: Diagnosis present

## 2022-05-04 DIAGNOSIS — R040 Epistaxis: Secondary | ICD-10-CM | POA: Diagnosis present

## 2022-05-04 DIAGNOSIS — Z79899 Other long term (current) drug therapy: Secondary | ICD-10-CM

## 2022-05-04 DIAGNOSIS — K8689 Other specified diseases of pancreas: Secondary | ICD-10-CM | POA: Diagnosis present

## 2022-05-04 DIAGNOSIS — N1330 Unspecified hydronephrosis: Secondary | ICD-10-CM | POA: Diagnosis present

## 2022-05-04 DIAGNOSIS — K859 Acute pancreatitis without necrosis or infection, unspecified: Secondary | ICD-10-CM | POA: Diagnosis not present

## 2022-05-04 DIAGNOSIS — Z1152 Encounter for screening for COVID-19: Secondary | ICD-10-CM

## 2022-05-04 DIAGNOSIS — I10 Essential (primary) hypertension: Secondary | ICD-10-CM | POA: Diagnosis present

## 2022-05-04 DIAGNOSIS — Z9049 Acquired absence of other specified parts of digestive tract: Secondary | ICD-10-CM

## 2022-05-04 DIAGNOSIS — E871 Hypo-osmolality and hyponatremia: Secondary | ICD-10-CM | POA: Diagnosis present

## 2022-05-04 DIAGNOSIS — R739 Hyperglycemia, unspecified: Secondary | ICD-10-CM | POA: Diagnosis present

## 2022-05-04 DIAGNOSIS — Q453 Other congenital malformations of pancreas and pancreatic duct: Secondary | ICD-10-CM

## 2022-05-04 DIAGNOSIS — Z8249 Family history of ischemic heart disease and other diseases of the circulatory system: Secondary | ICD-10-CM

## 2022-05-04 LAB — CBC WITH DIFFERENTIAL/PLATELET
Abs Immature Granulocytes: 0.01 10*3/uL (ref 0.00–0.07)
Basophils Absolute: 0 10*3/uL (ref 0.0–0.1)
Basophils Relative: 0 %
Eosinophils Absolute: 0 10*3/uL (ref 0.0–0.5)
Eosinophils Relative: 0 %
HCT: 36.8 % — ABNORMAL LOW (ref 39.0–52.0)
Hemoglobin: 12.8 g/dL — ABNORMAL LOW (ref 13.0–17.0)
Immature Granulocytes: 0 %
Lymphocytes Relative: 47 %
Lymphs Abs: 2.9 10*3/uL (ref 0.7–4.0)
MCH: 34 pg (ref 26.0–34.0)
MCHC: 34.8 g/dL (ref 30.0–36.0)
MCV: 97.9 fL (ref 80.0–100.0)
Monocytes Absolute: 0.5 10*3/uL (ref 0.1–1.0)
Monocytes Relative: 8 %
Neutro Abs: 2.8 10*3/uL (ref 1.7–7.7)
Neutrophils Relative %: 45 %
Platelets: 160 10*3/uL (ref 150–400)
RBC: 3.76 MIL/uL — ABNORMAL LOW (ref 4.22–5.81)
RDW: 15.6 % — ABNORMAL HIGH (ref 11.5–15.5)
WBC: 6.2 10*3/uL (ref 4.0–10.5)
nRBC: 0 % (ref 0.0–0.2)

## 2022-05-04 LAB — COMPREHENSIVE METABOLIC PANEL
ALT: 22 U/L (ref 0–44)
AST: 40 U/L (ref 15–41)
Albumin: 3.7 g/dL (ref 3.5–5.0)
Alkaline Phosphatase: 66 U/L (ref 38–126)
Anion gap: 12 (ref 5–15)
BUN: 8 mg/dL (ref 6–20)
CO2: 22 mmol/L (ref 22–32)
Calcium: 9.5 mg/dL (ref 8.9–10.3)
Chloride: 103 mmol/L (ref 98–111)
Creatinine, Ser: 1.22 mg/dL (ref 0.61–1.24)
GFR, Estimated: >60 mL/min (ref >60)
Glucose, Bld: 116 mg/dL — ABNORMAL HIGH (ref 70–99)
Potassium: 4 mmol/L (ref 3.5–5.1)
Sodium: 137 mmol/L (ref 135–145)
Total Bilirubin: 0.8 mg/dL (ref 0.3–1.2)
Total Protein: 6.8 g/dL (ref 6.5–8.1)

## 2022-05-04 LAB — LIPASE, BLOOD: Lipase: 45 U/L (ref 11–51)

## 2022-05-04 LAB — ETHANOL: Alcohol, Ethyl (B): 86 mg/dL — ABNORMAL HIGH (ref <10)

## 2022-05-04 MED ORDER — ONDANSETRON 4 MG PO TBDP
8.0000 mg | ORAL_TABLET | Freq: Once | ORAL | Status: AC
  Administered 2022-05-04: 8 mg via ORAL
  Filled 2022-05-04: qty 2

## 2022-05-04 MED ORDER — IOHEXOL 350 MG/ML SOLN
75.0000 mL | Freq: Once | INTRAVENOUS | Status: AC | PRN
  Administered 2022-05-04: 75 mL via INTRAVENOUS

## 2022-05-04 MED ORDER — OXYCODONE-ACETAMINOPHEN 5-325 MG PO TABS
1.0000 | ORAL_TABLET | Freq: Once | ORAL | Status: AC
  Administered 2022-05-04: 1 via ORAL
  Filled 2022-05-04: qty 1

## 2022-05-04 NOTE — ED Triage Notes (Signed)
Pt presents with N/V/D, abd pain, hemataemsis x 3 weeks. Worse today. Pt with a hx of chronic pancreatitis.

## 2022-05-04 NOTE — ED Provider Triage Note (Signed)
Emergency Medicine Provider Triage Evaluation Note  Kyle Berry , a 52 y.o. male  was evaluated in triage.  Pt complains of abdominal pain, nausea, vomiting, hematemesis, dark/tarry stools.  Patient reports history of pancreatitis symptoms currently feel similar.  Reports acute worsening over the past 3 weeks.  States has been able to keep anything down.  Saw GI through [facility] earlier today who told to come emergency department for further evaluation but patient did not want to wait at [facility] due to wait times so he came to [facility] for further evaluation.  Patient reports continued alcohol use.  Reports "large amounts of blood" in vomit intermittent over the past few weeks.  He stated streaky blood in vomit earlier today.  States that he is making minimal urine secondary to decreased p.o. intake and episodes of emesis.  Review of Systems  Positive: See above Negative:   Physical Exam  BP (!) 176/109 (BP Location: Right Arm)   Pulse (!) 106   Temp 99 F (37.2 C) (Oral)   Resp 16   Ht '6\' 1"'$  (1.854 m)   Wt 74.8 kg   SpO2 99%   BMI 21.77 kg/m  Gen:   Awake, no distress   Resp:  Normal effort  MSK:   Moves extremities without difficulty  Other:  Epigastric tenderness to palpation  Medical Decision Making  Medically screening exam initiated at 6:24 PM.  Appropriate orders placed.  [name] was informed that the remainder of the evaluation will be completed by another provider, this initial triage assessment does not replace that evaluation, and the importance of remaining in the ED until their evaluation is complete.     Wilnette Kales, [place] 05/04/22 [phone]

## 2022-05-05 DIAGNOSIS — Z9049 Acquired absence of other specified parts of digestive tract: Secondary | ICD-10-CM | POA: Diagnosis not present

## 2022-05-05 DIAGNOSIS — K859 Acute pancreatitis without necrosis or infection, unspecified: Secondary | ICD-10-CM

## 2022-05-05 DIAGNOSIS — Q453 Other congenital malformations of pancreas and pancreatic duct: Secondary | ICD-10-CM | POA: Diagnosis not present

## 2022-05-05 DIAGNOSIS — K219 Gastro-esophageal reflux disease without esophagitis: Secondary | ICD-10-CM | POA: Diagnosis present

## 2022-05-05 DIAGNOSIS — E7800 Pure hypercholesterolemia, unspecified: Secondary | ICD-10-CM | POA: Diagnosis present

## 2022-05-05 DIAGNOSIS — K861 Other chronic pancreatitis: Secondary | ICD-10-CM | POA: Diagnosis not present

## 2022-05-05 DIAGNOSIS — I10 Essential (primary) hypertension: Secondary | ICD-10-CM | POA: Diagnosis present

## 2022-05-05 DIAGNOSIS — Z79899 Other long term (current) drug therapy: Secondary | ICD-10-CM | POA: Diagnosis not present

## 2022-05-05 DIAGNOSIS — E871 Hypo-osmolality and hyponatremia: Secondary | ICD-10-CM | POA: Diagnosis present

## 2022-05-05 DIAGNOSIS — Z1152 Encounter for screening for COVID-19: Secondary | ICD-10-CM | POA: Diagnosis not present

## 2022-05-05 DIAGNOSIS — N179 Acute kidney failure, unspecified: Secondary | ICD-10-CM | POA: Diagnosis not present

## 2022-05-05 DIAGNOSIS — R739 Hyperglycemia, unspecified: Secondary | ICD-10-CM | POA: Diagnosis present

## 2022-05-05 DIAGNOSIS — K8689 Other specified diseases of pancreas: Secondary | ICD-10-CM | POA: Diagnosis not present

## 2022-05-05 DIAGNOSIS — K8681 Exocrine pancreatic insufficiency: Secondary | ICD-10-CM | POA: Diagnosis present

## 2022-05-05 DIAGNOSIS — R040 Epistaxis: Secondary | ICD-10-CM | POA: Diagnosis present

## 2022-05-05 DIAGNOSIS — N1330 Unspecified hydronephrosis: Secondary | ICD-10-CM | POA: Diagnosis present

## 2022-05-05 DIAGNOSIS — K920 Hematemesis: Secondary | ICD-10-CM | POA: Diagnosis present

## 2022-05-05 DIAGNOSIS — Z8249 Family history of ischemic heart disease and other diseases of the circulatory system: Secondary | ICD-10-CM | POA: Diagnosis not present

## 2022-05-05 LAB — URINALYSIS, ROUTINE W REFLEX MICROSCOPIC
Bilirubin Urine: NEGATIVE
Glucose, UA: NEGATIVE mg/dL
Hgb urine dipstick: NEGATIVE
Ketones, ur: NEGATIVE mg/dL
Leukocytes,Ua: NEGATIVE
Nitrite: NEGATIVE
Protein, ur: NEGATIVE mg/dL
Specific Gravity, Urine: 1.016 (ref 1.005–1.030)
pH: 6 (ref 5.0–8.0)

## 2022-05-05 MED ORDER — GABAPENTIN 100 MG PO CAPS
100.0000 mg | ORAL_CAPSULE | Freq: Three times a day (TID) | ORAL | Status: DC
  Administered 2022-05-05 – 2022-05-11 (×19): 100 mg via ORAL
  Filled 2022-05-05 (×20): qty 1

## 2022-05-05 MED ORDER — PROCHLORPERAZINE EDISYLATE 10 MG/2ML IJ SOLN
10.0000 mg | Freq: Once | INTRAMUSCULAR | Status: AC
  Administered 2022-05-05: 10 mg via INTRAVENOUS
  Filled 2022-05-05: qty 2

## 2022-05-05 MED ORDER — HYDROMORPHONE HCL 1 MG/ML IJ SOLN
INTRAMUSCULAR | Status: AC
  Administered 2022-05-05: 0.5 mg
  Filled 2022-05-05: qty 0.5

## 2022-05-05 MED ORDER — LABETALOL HCL 5 MG/ML IV SOLN
10.0000 mg | INTRAVENOUS | Status: DC | PRN
  Administered 2022-05-05 – 2022-05-08 (×3): 10 mg via INTRAVENOUS
  Filled 2022-05-05 (×4): qty 4

## 2022-05-05 MED ORDER — LACTATED RINGERS IV BOLUS
1000.0000 mL | Freq: Once | INTRAVENOUS | Status: AC
  Administered 2022-05-05: 1000 mL via INTRAVENOUS

## 2022-05-05 MED ORDER — HYDRALAZINE HCL 25 MG PO TABS: 25.0000 mg | ORAL_TABLET | ORAL | Status: DC | PRN

## 2022-05-05 MED ORDER — ONDANSETRON HCL 4 MG PO TABS: 4.0000 mg | ORAL_TABLET | Freq: Four times a day (QID) | ORAL | Status: DC | PRN

## 2022-05-05 MED ORDER — ONDANSETRON HCL 4 MG/2ML IJ SOLN
4.0000 mg | Freq: Four times a day (QID) | INTRAMUSCULAR | Status: DC | PRN
  Administered 2022-05-06 – 2022-05-09 (×6): 4 mg via INTRAVENOUS
  Filled 2022-05-05 (×7): qty 2

## 2022-05-05 MED ORDER — HYDRALAZINE HCL 20 MG/ML IJ SOLN
10.0000 mg | INTRAMUSCULAR | Status: DC | PRN
  Administered 2022-05-05: 10 mg via INTRAVENOUS
  Filled 2022-05-05: qty 1

## 2022-05-05 MED ORDER — LACTATED RINGERS IV SOLN: INTRAVENOUS | Status: DC

## 2022-05-05 MED ORDER — SODIUM CHLORIDE 0.9% FLUSH
3.0000 mL | Freq: Two times a day (BID) | INTRAVENOUS | Status: DC
  Administered 2022-05-05 – 2022-05-11 (×7): 3 mL via INTRAVENOUS

## 2022-05-05 MED ORDER — HYDROMORPHONE HCL 1 MG/ML IJ SOLN
1.0000 mg | Freq: Once | INTRAMUSCULAR | Status: AC
  Administered 2022-05-05: 1 mg via INTRAVENOUS
  Filled 2022-05-05: qty 1

## 2022-05-05 MED ORDER — POLYETHYLENE GLYCOL 3350 17 G PO PACK: 17.0000 g | PACK | Freq: Every day | ORAL | Status: DC | PRN

## 2022-05-05 MED ORDER — ACETAMINOPHEN 325 MG PO TABS: 650.0000 mg | ORAL_TABLET | Freq: Four times a day (QID) | ORAL | Status: DC | PRN

## 2022-05-05 MED ORDER — HYDROMORPHONE HCL 1 MG/ML IJ SOLN
0.5000 mg | INTRAMUSCULAR | Status: DC | PRN
  Administered 2022-05-05 – 2022-05-06 (×4): 0.5 mg via INTRAVENOUS
  Filled 2022-05-05 (×4): qty 0.5

## 2022-05-05 MED ORDER — ONDANSETRON HCL 4 MG/2ML IJ SOLN
4.0000 mg | Freq: Once | INTRAMUSCULAR | Status: AC
  Administered 2022-05-05: 4 mg via INTRAVENOUS
  Filled 2022-05-05: qty 2

## 2022-05-05 MED ORDER — PROCHLORPERAZINE EDISYLATE 10 MG/2ML IJ SOLN
10.0000 mg | Freq: Four times a day (QID) | INTRAMUSCULAR | Status: DC | PRN
  Administered 2022-05-05: 10 mg via INTRAVENOUS
  Filled 2022-05-05 (×2): qty 2

## 2022-05-05 MED ORDER — ONDANSETRON HCL 4 MG/2ML IJ SOLN
INTRAMUSCULAR | Status: AC
  Administered 2022-05-05: 4 mg
  Filled 2022-05-05: qty 2

## 2022-05-05 MED ORDER — OXYCODONE HCL 5 MG PO TABS: 5.0000 mg | ORAL_TABLET | ORAL | Status: DC | PRN

## 2022-05-05 MED ORDER — ACETAMINOPHEN 650 MG RE SUPP: 650.0000 mg | Freq: Four times a day (QID) | RECTAL | Status: DC | PRN

## 2022-05-05 NOTE — Assessment & Plan Note (Addendum)
-  vitals and Hgb stable - possible irritation from emesis vs MW tear vs other - follows with [facility] for this; recently he spoke with [facility] on 1/19 and was told to come to the ER if recurred - patient endorses scant episode of hematemesis this morning and is requesting GI evaluation due to this

## 2022-05-05 NOTE — Assessment & Plan Note (Addendum)
-  last hospitalized 03/07/22 for prior flare -CT abdomen/pelvis shows "stable changes of acute on chronic pancreatitis".  No peripancreatic necrosis or fluid collections noted - Ethanol level 86.  Lipase 45 initially - TG normal, 75 -Symptomatically improving and tolerating clears yesterday however had episode of vomiting last night and this morning and states there was some blood tinge -He is amenable for full liquids today -Dilaudid and Oxy ordered however patient only using Dilaudid currently.  Will need to start decreasing

## 2022-05-05 NOTE — H&P (Signed)
History and Physical    Kyle Berry  OZD:[id number]  DOB: Apr 06, 1971  DOA: 05/04/2022  PCP: [facility] Patient coming from: home  Chief Complaint: abd pain, N/V  HPI:  Kyle Berry is a 52 yo male with PMH chronic pancreatitis (h/o pancreas divisum and s/p ERCP with biliary duct sphincterotomy and pancreatic duct placement at [facility], removed on 7/20), solitary left kidney, HTN. He presented with worsening abdominal pain, N/V x 3 weeks, decreased urine output, and some recent blood mixed with vomitus.  CT A/P noting acute on chronic pancreatitis; no necrosis or fluid collections. Mild left nonobstructing hydronephrosis.  Lipase 45. Etoh level 86 (patient endorsed some drinking some beer to help urinate).  Creatinine normal, 1.22 and Hgb stable 12.8 g/dL.  Patient admitted for IVF, bowel rest, pain/nausea control, and monitoring for any further hematemesis.   I have personally briefly reviewed patient's old medical records in [facility] and discussed patient with the ER provider when appropriate/indicated.  Assessment and Plan: * Acute on chronic pancreatitis ([place]) - last hospitalized 03/07/22 for prior flare -CT abdomen/pelvis shows "stable changes of acute on chronic pancreatitis".  No peripancreatic necrosis or fluid collections noted - Ethanol level 86.  Lipase 45 - keep NPO for now - continue IVF - pain and nausea control  Hematemesis - vitals and Hgb stable - possible irritation from emesis vs MW tear vs other - monitor for further recurrence - check CBC in am  Pancreatic insufficiency - will restart pancreatic enzymes once diet resumes  Essential hypertension - labetalol or hydralazine PRN  Esophageal reflux - will resume home meds when able   Code Status:     Code Status: Full Code  DVT Prophylaxis: PADUA<4     Anticipated disposition is to: Home  History: Past Medical History:  Diagnosis Date   Atrophy of right  kidney    Chronic bronchitis ([place])    Chronic lower back pain    Chronic pancreatitis ([place])    Gastroparesis 06/2020   confirmed by emptying study at [facility]   GERD (gastroesophageal reflux disease)    Headache    "once/month" (05/13/2017)   High cholesterol    Hypertension    Migraine    "a couple/year" (05/13/2017)   Nephrolithiasis 05/13/2017   Pancreatitis    Pneumonia ~ 09/2015   Presence of pancreatic duct stent    Recurrent acute pancreatitis    Shortness of breath 05/22/2021    Past Surgical History:  Procedure Laterality Date   BIOPSY  11/02/2020   Procedure: BIOPSY;  Surgeon: Irving Copas., MD;  Location: [place];  Service: Gastroenterology;;   CHOLECYSTECTOMY N/A 10/23/2017   Procedure: LAPAROSCOPIC CHOLECYSTECTOMY WITH INTRAOPERATIVE CHOLANGIOGRAM;  Surgeon: Johnathan Hausen, MD;  [facility];  Service: General;  Laterality: N/A;   COLONOSCOPY WITH PROPOFOL N/A 11/02/2019   Procedure: COLONOSCOPY WITH PROPOFOL;  Surgeon: Irene Shipper, MD;  Location: [facility] ENDOSCOPY;  Service: Endoscopy;  Laterality: N/A;   ERCP N/A 11/02/2020   Procedure: ENDOSCOPIC RETROGRADE CHOLANGIOPANCREATOGRAPHY (ERCP);  Surgeon: Irving Copas., MD;  Location: [place];  Service: Gastroenterology;  Laterality: N/A;   ESOPHAGOGASTRODUODENOSCOPY N/A 11/02/2020   Procedure: ESOPHAGOGASTRODUODENOSCOPY (EGD);  Surgeon: Irving Copas., MD;  Location: [place];  Service: Gastroenterology;  Laterality: N/A;   ESOPHAGOGASTRODUODENOSCOPY (EGD) WITH PROPOFOL N/A 11/06/2019   Procedure: ESOPHAGOGASTRODUODENOSCOPY (EGD) WITH PROPOFOL;  Surgeon: Irene Shipper, MD;  Location: [facility] ENDOSCOPY;  Service: Endoscopy;  Laterality: N/A;   HEMOSTASIS CLIP PLACEMENT  11/02/2019   Procedure: HEMOSTASIS CLIP PLACEMENT;  Surgeon: Irene Shipper, MD;  Location: [facility] ENDOSCOPY;  Service: Endoscopy;;   NO PAST SURGERIES     REMOVAL OF STONES  11/02/2020   Procedure: REMOVAL OF STONES;  Surgeon:  Irving Copas., MD;  Location: [place];  Service: Gastroenterology;;   Lavell Islam REMOVAL  11/02/2020   Procedure: STENT REMOVAL;  Surgeon: Irving Copas., MD;  Location: [place];  Service: Gastroenterology;;     reports that he has never smoked. He has never used smokeless tobacco. He reports that he does not currently use alcohol. He reports that he does not use drugs.  Allergies  Allergen Reactions   Diclofenac Hives and Other (See Comments)    Fluid buildup in chest   Norvasc [Amlodipine Besylate] Other (See Comments)    Fluid buildup in chest   Omeprazole Other (See Comments)    MD stopped due to pancreatitis   Hydrochlorothiazide     07/31/2016 -  07/20/2016 -  05/28/2016 -   Other Reaction(s) from [place]: itching   Prednisone Hives and Other (See Comments)    Mood swings    Family History  Problem Relation Age of Onset   Hypertension Other    Hypertension Mother    Hypertension Father    Kidney disease Father    Hypertension Sister    Diabetes Sister    Hypertension Brother    Pancreatic cancer Paternal Grandmother    Colon cancer Cousin    Stomach cancer Neg Hx    Esophageal cancer Neg Hx    Home Medications: Prior to Admission medications   Medication Sig Start Date End Date Taking? Authorizing Provider  albuterol (VENTOLIN HFA) 108 (90 Base) MCG/ACT inhaler Inhale 2 puffs into the lungs every 6 (six) hours as needed for wheezing or shortness of breath.   Yes [provider]  famotidine (PEPCID) 20 MG tablet Take 1 tablet (20 mg total) by mouth 2 (two) times daily. Patient taking differently: Take 20 mg by mouth daily as needed for heartburn. 09/26/21 05/06/23 Yes Barb Merino, MD  feeding supplement (ENSURE ENLIVE / ENSURE PLUS) LIQD Take 237 mLs by mouth 2 (two) times daily between meals. Patient taking differently: Take 237 mLs by mouth See admin instructions. Drink 237 ml by mouth 2-3 times a day between meals 02/04/21  Yes  Dahal, Marlowe Aschoff, MD  fluticasone (FLONASE) 50 MCG/ACT nasal spray Place 1 spray into both nostrils daily as needed for allergies. 01/15/22  Yes [provider]  gabapentin (NEURONTIN) 100 MG capsule Take 1 capsule (100 mg total) by mouth 3 (three) times daily. 10/27/21  Yes Lavina Hamman, MD  Naphazoline-Pheniramine (VISINE-A OP) Place 2 drops into both eyes daily as needed (itchy or irritated eyes).   Yes [provider]  Pancrelipase, Lip-Prot-Amyl, (PANCREAZE) 21000-54700 units CPEP Take 1 capsule by mouth with breakfast, with lunch, and with evening meal.   Yes [provider]  telmisartan (MICARDIS) 40 MG tablet Take 1 tablet (40 mg total) by mouth daily. 03/07/22 05/06/23 Yes Oswald Hillock, MD    Review of Systems:  Review of Systems  Constitutional:  Negative for chills, fever and malaise/fatigue.  HENT: Negative.    Eyes: Negative.   Respiratory: Negative.    Cardiovascular: Negative.   Gastrointestinal:  Positive for abdominal pain, nausea and vomiting.  Genitourinary: Negative.   Musculoskeletal: Negative.   Skin: Negative.   Neurological: Negative.   Endo/Heme/Allergies: Negative.   Psychiatric/Behavioral: Negative.  Physical Exam:  Vitals:   05/05/22 0437 05/05/22 0650 05/05/22 1132 05/05/22 1243  BP: (!) 176/100 (!) 153/95 (!) 168/97 (!) 141/104  Pulse: 84 79 65 63  Resp: '18 18 16 18  '$ Temp: 97.9 F (36.6 C) 98.5 F (36.9 C) 97.6 F (36.4 C) 97.6 F (36.4 C)  TempSrc: Oral Oral Oral Oral  SpO2: 100% 100% 98% 99%  Weight:      Height:       Physical Exam Constitutional:      General: He is not in acute distress.    Appearance: Normal appearance.  HENT:     Head: Normocephalic and atraumatic.     Mouth/Throat:     Mouth: Mucous membranes are moist.  Eyes:     Extraocular Movements: Extraocular movements intact.  Pulmonary:     Effort: Pulmonary effort is normal.     Breath sounds: Normal breath sounds.  Abdominal:      General: Bowel sounds are normal. There is no distension.     Palpations: Abdomen is soft.     Tenderness: There is abdominal tenderness.     Comments: LUQ TTP, no R/G  Musculoskeletal:        General: No swelling. Normal range of motion.     Cervical back: Normal range of motion and neck supple.  Skin:    General: Skin is warm and dry.  Neurological:     General: No focal deficit present.     Mental Status: He is alert.  Psychiatric:        Mood and Affect: Mood normal.      Labs on Admission:  I have personally reviewed following labs and imaging studies Results for orders placed or performed during the hospital encounter of 05/04/22 (from the past 24 hour(s))  Comprehensive metabolic panel     Status: Abnormal   Collection Time: 05/04/22  6:30 PM  Result Value Ref Range   Sodium 137 135 - 145 mmol/L   Potassium 4.0 3.5 - 5.1 mmol/L   Chloride 103 98 - 111 mmol/L   CO2 22 22 - 32 mmol/L   Glucose, Bld 116 (H) 70 - 99 mg/dL   BUN 8 6 - 20 mg/dL   Creatinine, Ser 1.22 0.61 - 1.24 mg/dL   Calcium 9.5 8.9 - 10.3 mg/dL   Total Protein 6.8 6.5 - 8.1 g/dL   Albumin 3.7 3.5 - 5.0 g/dL   AST 40 15 - 41 U/L   ALT 22 0 - 44 U/L   Alkaline Phosphatase 66 38 - 126 U/L   Total Bilirubin 0.8 0.3 - 1.2 mg/dL   GFR, Estimated >60 >60 mL/min   Anion gap 12 5 - 15  CBC with Differential     Status: Abnormal   Collection Time: 05/04/22  6:30 PM  Result Value Ref Range   WBC 6.2 4.0 - 10.5 K/uL   RBC 3.76 (L) 4.22 - 5.81 MIL/uL   Hemoglobin 12.8 (L) 13.0 - 17.0 g/dL   HCT 36.8 (L) 39.0 - 52.0 %   MCV 97.9 80.0 - 100.0 fL   MCH 34.0 26.0 - 34.0 pg   MCHC 34.8 30.0 - 36.0 g/dL   RDW 15.6 (H) 11.5 - 15.5 %   Platelets 160 150 - 400 K/uL   nRBC 0.0 0.0 - 0.2 %   Neutrophils Relative % 45 %   Neutro Abs 2.8 1.7 - 7.7 K/uL   Lymphocytes Relative 47 %   Lymphs Abs 2.9 0.7 - 4.0  K/uL   Monocytes Relative 8 %   Monocytes Absolute 0.5 0.1 - 1.0 K/uL   Eosinophils Relative 0 %    Eosinophils Absolute 0.0 0.0 - 0.5 K/uL   Basophils Relative 0 %   Basophils Absolute 0.0 0.0 - 0.1 K/uL   Immature Granulocytes 0 %   Abs Immature Granulocytes 0.01 0.00 - 0.07 K/uL  Lipase, blood     Status: None   Collection Time: 05/04/22  6:30 PM  Result Value Ref Range   Lipase 45 11 - 51 U/L  Ethanol     Status: Abnormal   Collection Time: 05/04/22  6:34 PM  Result Value Ref Range   Alcohol, Ethyl (B) 86 (H) <10 mg/dL  Urinalysis, Routine w reflex microscopic Urine, Clean Catch     Status: Abnormal   Collection Time: 05/05/22  5:23 AM  Result Value Ref Range   Color, Urine STRAW (A) YELLOW   APPearance CLEAR CLEAR   Specific Gravity, Urine 1.016 1.005 - 1.030   pH 6.0 5.0 - 8.0   Glucose, UA NEGATIVE NEGATIVE mg/dL   Hgb urine dipstick NEGATIVE NEGATIVE   Bilirubin Urine NEGATIVE NEGATIVE   Ketones, ur NEGATIVE NEGATIVE mg/dL   Protein, ur NEGATIVE NEGATIVE mg/dL   Nitrite NEGATIVE NEGATIVE   Leukocytes,Ua NEGATIVE NEGATIVE     Radiological Exams on Admission: CT Abdomen Pelvis W Contrast  Result Date: 05/04/2022 CLINICAL DATA:  Pancreatitis, acute, severe. EXAM: CT ABDOMEN AND PELVIS WITH CONTRAST TECHNIQUE: Multidetector CT imaging of the abdomen and pelvis was performed using the standard protocol following bolus administration of intravenous contrast. RADIATION DOSE REDUCTION: This exam was performed according to the departmental dose-optimization program which includes automated exposure control, adjustment of the mA and/or kV according to patient size and/or use of iterative reconstruction technique. CONTRAST:  53m OMNIPAQUE IOHEXOL 350 MG/ML SOLN COMPARISON:  03/05/2022 FINDINGS: Lower chest: No acute abnormality. Hepatobiliary: No focal liver abnormality is seen. Status post cholecystectomy. No biliary dilatation. Pancreas: Punctate calcification within the pancreatic head and trace peripancreatic infiltrative change within this region is again identified and appears  stable since prior examination in keeping with changes of acute on chronic pancreatitis. The pancreatic duct is not dilated. No peripancreatic fluid collections are identified. Normal enhancement of the pancreatic parenchyma. Spleen: Unremarkable Adrenals/Urinary Tract: The adrenal glands are unremarkable. Marked atrophy of the right kidney again identified. Compensatory hypertrophy of the left kidney again noted. Stable nonobstructing calculi within the lower pole left kidney measuring up to 6 mm since remote prior examination of 01/23/2022. No hydronephrosis. No ureteral calculi. No perinephric inflammatory stranding or fluid collections are identified. The bladder is unremarkable. Stomach/Bowel: Mild sigmoid diverticulosis. The stomach, small bowel, and large bowel are otherwise unremarkable. No evidence of obstruction or focal inflammation. Appendix normal. No free intraperitoneal gas or fluid. Vascular/Lymphatic: No significant vascular findings are present. No enlarged abdominal or pelvic lymph nodes. Reproductive: Prostate is unremarkable. Other: No abdominal wall hernia or abnormality. No abdominopelvic ascites. Musculoskeletal: Degenerative changes noted L5-S1. No acute bone abnormality. No lytic or blastic bone lesion. IMPRESSION: 1. Stable changes of acute on chronic pancreatitis. No pancreatic or peripancreatic necrosis. No peripancreatic fluid collections identified. 2. Stable marked atrophy of the right kidney with compensatory hypertrophy of the left kidney. Mild left nonobstructing nephrolithiasis. 3. Mild sigmoid diverticulosis. Electronically Signed   By: AFidela SalisburyM.D.   On: 05/04/2022 22:33   CT Abdomen Pelvis W Contrast  Final Result        DDwyane Dee  MD [facility] 05/05/2022, 1:11 PM

## 2022-05-05 NOTE — Hospital Course (Signed)
Kyle Berry is a 52 yo male with PMH chronic pancreatitis (h/o pancreas divisum and s/p ERCP with biliary duct sphincterotomy and pancreatic duct placement at [facility], removed on 7/20), solitary left kidney, HTN. He presented with worsening abdominal pain, N/V x 3 weeks, decreased urine output, and some recent blood mixed with vomitus.  CT A/P noting acute on chronic pancreatitis; no necrosis or fluid collections. Mild left nonobstructing hydronephrosis.  Lipase 45. Etoh level 86 (patient endorsed some drinking some beer to help urinate).  Creatinine normal, 1.22 and Hgb stable 12.8 g/dL.  Patient admitted for IVF, bowel rest, pain/nausea control, and monitoring for any further hematemesis.

## 2022-05-05 NOTE — Assessment & Plan Note (Signed)
-  will restart pancreatic enzymes once diet resumes

## 2022-05-05 NOTE — ED Provider Notes (Signed)
[facility] Emergency Department Provider Note MRN:  [id number]  Arrival date & time: 05/05/22     Chief Complaint   Abdominal pain  History of Present Illness   Kyle Berry is a 52 y.o. year-old male with a history of pancreatitis presenting to the ED with chief complaint of abdominal pain.  Epigastric abdominal pain with radiation to the mid thoracic back.  Has happened before, long history of pancreatitis.  Did not make any urine yesterday and so drank some beer to see if that would help.  Denies fever  Review of Systems  A thorough review of systems was obtained and all systems are negative except as noted in the HPI and PMH.   Patient's Health History    Past Medical History:  Diagnosis Date   Atrophy of right kidney    Chronic bronchitis (H[facility])    Chronic lower back pain    Chronic pancreatitis ([place])    Gastroparesis 06/2020   confirmed by emptying study at [facility]   GERD (gastroesophageal reflux disease)    Headache    "once/month" (05/13/2017)   High cholesterol    Hypertension    Migraine    "a couple/year" (05/13/2017)   Nephrolithiasis 05/13/2017   Pancreatitis    Pneumonia ~ 09/2015   Presence of pancreatic duct stent    Recurrent acute pancreatitis    Shortness of breath 05/22/2021    Past Surgical History:  Procedure Laterality Date   BIOPSY  11/02/2020   Procedure: BIOPSY;  Surgeon: Irving Copas., MD;  Location: [place];  Service: Gastroenterology;;   CHOLECYSTECTOMY N/A 10/23/2017   Procedure: LAPAROSCOPIC CHOLECYSTECTOMY WITH INTRAOPERATIVE CHOLANGIOGRAM;  Surgeon: Johnathan Hausen, MD;  [facility];  Service: General;  Laterality: N/A;   COLONOSCOPY WITH PROPOFOL N/A 11/02/2019   Procedure: COLONOSCOPY WITH PROPOFOL;  Surgeon: Irene Shipper, MD;  Location: [facility] ENDOSCOPY;  Service: Endoscopy;  Laterality: N/A;   ERCP N/A 11/02/2020   Procedure: ENDOSCOPIC RETROGRADE CHOLANGIOPANCREATOGRAPHY (ERCP);  Surgeon:  Irving Copas., MD;  Location: [place];  Service: Gastroenterology;  Laterality: N/A;   ESOPHAGOGASTRODUODENOSCOPY N/A 11/02/2020   Procedure: ESOPHAGOGASTRODUODENOSCOPY (EGD);  Surgeon: Irving Copas., MD;  Location: [place];  Service: Gastroenterology;  Laterality: N/A;   ESOPHAGOGASTRODUODENOSCOPY (EGD) WITH PROPOFOL N/A 11/06/2019   Procedure: ESOPHAGOGASTRODUODENOSCOPY (EGD) WITH PROPOFOL;  Surgeon: Irene Shipper, MD;  Location: [facility] ENDOSCOPY;  Service: Endoscopy;  Laterality: N/A;   HEMOSTASIS CLIP PLACEMENT  11/02/2019   Procedure: HEMOSTASIS CLIP PLACEMENT;  Surgeon: Irene Shipper, MD;  Location: [facility] ENDOSCOPY;  Service: Endoscopy;;   NO PAST SURGERIES     REMOVAL OF STONES  11/02/2020   Procedure: REMOVAL OF STONES;  Surgeon: Rush Landmark Telford Nab., MD;  Location: [place];  Service: Gastroenterology;;   Lavell Islam REMOVAL  11/02/2020   Procedure: STENT REMOVAL;  Surgeon: Irving Copas., MD;  Location: [facility] ENDOSCOPY;  Service: Gastroenterology;;    Family History  Problem Relation Age of Onset   Hypertension Other    Hypertension Mother    Hypertension Father    Kidney disease Father    Hypertension Sister    Diabetes Sister    Hypertension Brother    Pancreatic cancer Paternal Grandmother    Colon cancer Cousin    Stomach cancer Neg Hx    Esophageal cancer Neg Hx     Social History   Socioeconomic History   Marital status: Single    Spouse name: Not on file   Number of children:  Not on file   Years of education: Not on file   Highest education level: Not on file  Occupational History   Occupation: Engineer, building services  Tobacco Use   Smoking status: Never   Smokeless tobacco: Never  Vaping Use   Vaping Use: Never used  Substance and Sexual Activity   Alcohol use: Not Currently    Comment: remote h/o heavy use   Drug use: No   Sexual activity: Yes    Partners: Female    Birth control/protection: Condom  Other Topics Concern   Not on  file  Social History Narrative   Not on file   Social Determinants of Health   Financial Resource Strain: Not on file  Food Insecurity: No Food Insecurity (01/24/2022)   Hunger Vital Sign    Worried About Running Out of Food in the Last Year: Never true    Ran Out of Food in the Last Year: Never true  Transportation Needs: No Transportation Needs (01/24/2022)   PRAPARE - Hydrologist (Medical): No    Lack of Transportation (Non-Medical): No  Physical Activity: Not on file  Stress: Not on file  Social Connections: Not on file  Intimate Partner Violence: Not At Risk (01/24/2022)   Humiliation, Afraid, Rape, and Kick questionnaire    Fear of Current or Ex-Partner: No    Emotionally Abused: No    Physically Abused: No    Sexually Abused: No     Physical Exam   Vitals:   05/05/22 0145 05/05/22 0437  BP: (!) 153/98 (!) 176/100  Pulse: 81 84  Resp: 18 18  Temp: 98.9 F (37.2 C) 97.9 F (36.6 C)  SpO2: 100% 100%    CONSTITUTIONAL: Well-appearing, NAD NEURO/PSYCH:  Alert and oriented x 3, no focal deficits EYES:  eyes equal and reactive ENT/NECK:  no LAD, no JVD CARDIO: Regular rate, well-perfused, normal S1 and S2 PULM:  CTAB no wheezing or rhonchi GI/GU:  non-distended, non-tender MSK/SPINE:  No gross deformities, no edema SKIN:  no rash, atraumatic   *Additional and/or pertinent findings included in MDM below  Diagnostic and Interventional Summary    EKG Interpretation  Date/Time:    Ventricular Rate:    PR Interval:    QRS Duration:   QT Interval:    QTC Calculation:   R Axis:     Text Interpretation:         Labs Reviewed  COMPREHENSIVE METABOLIC PANEL - Abnormal; Notable for the following components:      Result Value   Glucose, Bld 116 (*)    All other components within normal limits  CBC WITH DIFFERENTIAL/PLATELET - Abnormal; Notable for the following components:   RBC 3.76 (*)    Hemoglobin 12.8 (*)    HCT 36.8 (*)     RDW 15.6 (*)    All other components within normal limits  URINALYSIS, ROUTINE W REFLEX MICROSCOPIC - Abnormal; Notable for the following components:   Color, Urine STRAW (*)    All other components within normal limits  ETHANOL - Abnormal; Notable for the following components:   Alcohol, Ethyl (B) 86 (*)    All other components within normal limits  LIPASE, BLOOD    CT Abdomen Pelvis W Contrast  Final Result      Medications  HYDROmorphone (DILAUDID) injection 1 mg (has no administration in time range)  ondansetron (ZOFRAN) injection 4 mg (has no administration in time range)  ondansetron (ZOFRAN-ODT) disintegrating tablet 8 mg (8  mg Oral Given 05/04/22 1829)  oxyCODONE-acetaminophen (PERCOCET/ROXICET) 5-325 MG per tablet 1 tablet (1 tablet Oral Given 05/04/22 1829)  iohexol (OMNIPAQUE) 350 MG/ML injection 75 mL (75 mLs Intravenous Contrast Given 05/04/22 2221)  lactated ringers bolus 1,000 mL (0 mLs Intravenous Stopped 05/05/22 0520)  lactated ringers bolus 1,000 mL (0 mLs Intravenous Stopped 05/05/22 0359)  ondansetron (ZOFRAN) injection 4 mg (4 mg Intravenous Given 05/05/22 0158)  HYDROmorphone (DILAUDID) injection 1 mg (1 mg Intravenous Given 05/05/22 0158)  HYDROmorphone (DILAUDID) injection 1 mg (1 mg Intravenous Given 05/05/22 0433)  prochlorperazine (COMPAZINE) injection 10 mg (10 mg Intravenous Given 05/05/22 0433)     Procedures  /  Critical Care Procedures  ED Course and Medical Decision Making  Initial Impression and Ddx Acute on chronic pancreatitis, aortic pathology also considered but felt to be less likely.  Gastritis, perforated viscus also on the differential.  Past medical/surgical history that increases complexity of ED encounter: Pancreatitis  Interpretation of Diagnostics I personally reviewed the laboratory assessment and my interpretation is as follows: No significant blood count or electrolyte disturbance, mildly elevated alcohol level, lipase normal  CT  revealing acute on chronic pancreatitis without other complication  Patient Reassessment and Ultimate Disposition/Management     Continued symptoms, will admit to medicine.  Patient management required discussion with the following services or consulting groups:  Hospitalist Service  Complexity of Problems Addressed Acute illness or injury that poses threat of life of bodily function  Additional Data Reviewed and Analyzed Further history obtained from: Care Everywhere and Prior labs/imaging results  Additional Factors Impacting ED Encounter Risk Use of parenteral controlled substances and Consideration of hospitalization  Barth Kirks. [place] Small, [place] [email or web address]  Final Clinical Impressions(s) / ED Diagnoses     ICD-10-CM   1. Acute pancreatitis, unspecified complication status, unspecified pancreatitis type  K85.90       ED Discharge Orders     None        Discharge Instructions Discussed with and Provided to Patient:   Discharge Instructions   None      Maudie Flakes, MD 05/05/22 [phone]

## 2022-05-05 NOTE — Assessment & Plan Note (Signed)
-  will resume home meds when able

## 2022-05-05 NOTE — Assessment & Plan Note (Addendum)
-  labetalol or hydralazine PRN -Continue ARB.  Tolerating

## 2022-05-05 NOTE — ED Notes (Signed)
ED TO INPATIENT HANDOFF REPORT  ED Nurse Name and Phone #: Kenedy Haisley rn [phone]  S Name/Age/Gender Kyle Berry 52 y.o. male Room/Bed: H021C/H021C  Code Status   Code Status: Full Code  Home/SNF/Other Home Patient oriented to: self, place, time, and situation Is this baseline? Yes   Triage Complete: Triage complete  Chief Complaint Acute pancreatitis [K85.90]  Triage Note Pt presents with N/V/D, abd pain, hemataemsis x 3 weeks. Worse today. Pt with a hx of chronic pancreatitis.    Allergies Allergies  Allergen Reactions   Diclofenac Hives and Other (See Comments)    Fluid buildup in chest   Norvasc [Amlodipine Besylate] Other (See Comments)    Fluid buildup in chest   Omeprazole Other (See Comments)    MD stopped due to pancreatitis   Hydrochlorothiazide     07/31/2016 -  07/20/2016 -  05/28/2016 -   Other Reaction(s) from [place]: itching   Prednisone Hives and Other (See Comments)    Mood swings    Level of Care/Admitting Diagnosis ED Disposition     ED Disposition  Admit   Condition  --   [place]: [place] [[id number]]  Level of Care: Med-Surg [16]  May admit patient to [facility] or [facility] if equivalent level of care is available:: No  Covid Evaluation: Asymptomatic - no recent exposure (last 10 days) testing not required  Diagnosis: Acute pancreatitis [577.0.ICD-9-CM]  Admitting Physician: Dwyane Dee [phone]  Attending Physician: Dwyane Dee [[id number]]  Certification:: I certify this patient will need inpatient services for at least 2 midnights  Estimated Length of Stay: 2          B Medical/Surgery History Past Medical History:  Diagnosis Date   Atrophy of right kidney    Chronic bronchitis ([place])    Chronic lower back pain    Chronic pancreatitis ([place])    Gastroparesis 06/2020   confirmed by emptying [facility] at [facility]   GERD (gastroesophageal reflux disease)    Headache    "once/month"  (05/13/2017)   High cholesterol    Hypertension    Migraine    "a couple/year" (05/13/2017)   Nephrolithiasis 05/13/2017   Pancreatitis    Pneumonia ~ 09/2015   Presence of pancreatic duct stent    Recurrent acute pancreatitis    Shortness of breath 05/22/2021   Past Surgical History:  Procedure Laterality Date   BIOPSY  11/02/2020   Procedure: BIOPSY;  Surgeon: Irving Copas., MD;  Location: [place];  Service: Gastroenterology;;   CHOLECYSTECTOMY N/A 10/23/2017   Procedure: LAPAROSCOPIC CHOLECYSTECTOMY WITH INTRAOPERATIVE CHOLANGIOGRAM;  Surgeon: Johnathan Hausen, MD;  Location: [facility];  Service: General;  Laterality: N/A;   COLONOSCOPY WITH PROPOFOL N/A 11/02/2019   Procedure: COLONOSCOPY WITH PROPOFOL;  Surgeon: Irene Shipper, MD;  Location: [facility] ENDOSCOPY;  Service: Endoscopy;  Laterality: N/A;   ERCP N/A 11/02/2020   Procedure: ENDOSCOPIC RETROGRADE CHOLANGIOPANCREATOGRAPHY (ERCP);  Surgeon: Irving Copas., MD;  Location: [place];  Service: Gastroenterology;  Laterality: N/A;   ESOPHAGOGASTRODUODENOSCOPY N/A 11/02/2020   Procedure: ESOPHAGOGASTRODUODENOSCOPY (EGD);  Surgeon: Irving Copas., MD;  Location: [place];  Service: Gastroenterology;  Laterality: N/A;   ESOPHAGOGASTRODUODENOSCOPY (EGD) WITH PROPOFOL N/A 11/06/2019   Procedure: ESOPHAGOGASTRODUODENOSCOPY (EGD) WITH PROPOFOL;  Surgeon: Irene Shipper, MD;  Location: [facility] ENDOSCOPY;  Service: Endoscopy;  Laterality: N/A;   HEMOSTASIS CLIP PLACEMENT  11/02/2019   Procedure: HEMOSTASIS CLIP PLACEMENT;  Surgeon: Irene Shipper, MD;  Location: [place];  Service: Endoscopy;;   NO PAST SURGERIES     REMOVAL OF STONES  11/02/2020   Procedure: REMOVAL OF STONES;  Surgeon: Irving Copas., MD;  Location: [place];  Service: Gastroenterology;;   Lavell Islam REMOVAL  11/02/2020   Procedure: STENT REMOVAL;  Surgeon: Irving Copas., MD;  Location: [place];  Service: Gastroenterology;;      A IV Location/Drains/Wounds Patient Lines/Drains/Airways Status     Active Line/Drains/Airways     Name Placement date Placement time Site Days   Peripheral IV 05/04/22 20 G Anterior;Distal;Left;Upper Arm 05/04/22  2221  Arm  1            Intake/Output Last 24 hours  Intake/Output Summary (Last 24 hours) at 05/05/2022 0811 Last data filed at 05/05/2022 0520 Gross per 24 hour  Intake 2000 ml  Output --  Net 2000 ml    Labs/Imaging Results for orders placed or performed during the hospital encounter of 05/04/22 (from the past 48 hour(s))  Comprehensive metabolic panel     Status: Abnormal   Collection Time: 05/04/22  6:30 PM  Result Value Ref Range   Sodium 137 135 - 145 mmol/L   Potassium 4.0 3.5 - 5.1 mmol/L   Chloride 103 98 - 111 mmol/L   CO2 22 22 - 32 mmol/L   Glucose, Bld 116 (H) 70 - 99 mg/dL    Comment: Glucose reference range applies only to samples taken after fasting for at least 8 hours.   BUN 8 6 - 20 mg/dL   Creatinine, Ser 1.22 0.61 - 1.24 mg/dL   Calcium 9.5 8.9 - 10.3 mg/dL   Total Protein 6.8 6.5 - 8.1 g/dL   Albumin 3.7 3.5 - 5.0 g/dL   AST 40 15 - 41 U/L   ALT 22 0 - 44 U/L   Alkaline Phosphatase 66 38 - 126 U/L   Total Bilirubin 0.8 0.3 - 1.2 mg/dL   GFR, Estimated >60 >60 mL/min    Comment: (NOTE) Calculated using the CKD-EPI Creatinine Equation (2021)    Anion gap 12 5 - 15    Comment: Performed at [facility] [address], [place], [place] [address]  CBC with Differential     Status: Abnormal   Collection Time: 05/04/22  6:30 PM  Result Value Ref Range   WBC 6.2 4.0 - 10.5 K/uL   RBC 3.76 (L) 4.22 - 5.81 MIL/uL   Hemoglobin 12.8 (L) 13.0 - 17.0 g/dL   HCT 36.8 (L) 39.0 - 52.0 %   MCV 97.9 80.0 - 100.0 fL   MCH 34.0 26.0 - 34.0 pg   MCHC 34.8 30.0 - 36.0 g/dL   RDW 15.6 (H) 11.5 - 15.5 %   Platelets 160 150 - 400 K/uL   nRBC 0.0 0.0 - 0.2 %   Neutrophils Relative % 45 %   Neutro Abs 2.8 1.7 - 7.7 K/uL   Lymphocytes  Relative 47 %   Lymphs Abs 2.9 0.7 - 4.0 K/uL   Monocytes Relative 8 %   Monocytes Absolute 0.5 0.1 - 1.0 K/uL   Eosinophils Relative 0 %   Eosinophils Absolute 0.0 0.0 - 0.5 K/uL   Basophils Relative 0 %   Basophils Absolute 0.0 0.0 - 0.1 K/uL   Immature Granulocytes 0 %   Abs Immature Granulocytes 0.01 0.00 - 0.07 K/uL    Comment: Performed at [facility], [address], [place], [place] [address]  Lipase, blood     Status: None  Collection Time: 05/04/22  6:30 PM  Result Value Ref Range   Lipase 45 11 - 51 U/L    Comment: Performed at [facility], [place] [address], [place], [place] [address]  Ethanol     Status: Abnormal   Collection Time: 05/04/22  6:34 PM  Result Value Ref Range   Alcohol, Ethyl (B) 86 (H) <10 mg/dL    Comment: (NOTE) Lowest detectable limit for serum alcohol is 10 mg/dL.  For medical purposes only. Performed at [facility], [place] [address] [place], [place] [address]   Urinalysis, Routine w reflex microscopic Urine, Clean Catch     Status: Abnormal   Collection Time: 05/05/22  5:23 AM  Result Value Ref Range   Color, Urine STRAW (A) YELLOW   APPearance CLEAR CLEAR   Specific Gravity, Urine 1.016 1.005 - 1.030   pH 6.0 5.0 - 8.0   Glucose, UA NEGATIVE NEGATIVE mg/dL   Hgb urine dipstick NEGATIVE NEGATIVE   Bilirubin Urine NEGATIVE NEGATIVE   Ketones, ur NEGATIVE NEGATIVE mg/dL   Protein, ur NEGATIVE NEGATIVE mg/dL   Nitrite NEGATIVE NEGATIVE   Leukocytes,Ua NEGATIVE NEGATIVE    Comment: Performed at [facility] [address], [place], [place] [address]   CT Abdomen Pelvis W Contrast  Result Date: 05/04/2022 CLINICAL DATA:  Pancreatitis, acute, severe. EXAM: CT ABDOMEN AND PELVIS WITH CONTRAST TECHNIQUE: Multidetector CT imaging of the abdomen and pelvis was performed using the standard protocol following bolus administration of intravenous contrast. RADIATION DOSE REDUCTION: This exam was performed according to the  departmental dose-optimization program which includes automated exposure control, adjustment of the mA and/or kV according to patient size and/or use of iterative reconstruction technique. CONTRAST:  80m OMNIPAQUE IOHEXOL 350 MG/ML SOLN COMPARISON:  03/05/2022 FINDINGS: Lower chest: No acute abnormality. Hepatobiliary: No focal liver abnormality is seen. Status post cholecystectomy. No biliary dilatation. Pancreas: Punctate calcification within the pancreatic head and trace peripancreatic infiltrative change within this region is again identified and appears stable since prior examination in keeping with changes of acute on chronic pancreatitis. The pancreatic duct is not dilated. No peripancreatic fluid collections are identified. Normal enhancement of the pancreatic parenchyma. Spleen: Unremarkable Adrenals/Urinary Tract: The adrenal glands are unremarkable. Marked atrophy of the right kidney again identified. Compensatory hypertrophy of the left kidney again noted. Stable nonobstructing calculi within the lower pole left kidney measuring up to 6 mm since remote prior examination of 01/23/2022. No hydronephrosis. No ureteral calculi. No perinephric inflammatory stranding or fluid collections are identified. The bladder is unremarkable. Stomach/Bowel: Mild sigmoid diverticulosis. The stomach, small bowel, and large bowel are otherwise unremarkable. No evidence of obstruction or focal inflammation. Appendix normal. No free intraperitoneal gas or fluid. Vascular/Lymphatic: No significant vascular findings are present. No enlarged abdominal or pelvic lymph nodes. Reproductive: Prostate is unremarkable. Other: No abdominal wall hernia or abnormality. No abdominopelvic ascites. Musculoskeletal: Degenerative changes noted L5-S1. No acute bone abnormality. No lytic or blastic bone lesion. IMPRESSION: 1. Stable changes of acute on chronic pancreatitis. No pancreatic or peripancreatic necrosis. No peripancreatic fluid  collections identified. 2. Stable marked atrophy of the right kidney with compensatory hypertrophy of the left kidney. Mild left nonobstructing nephrolithiasis. 3. Mild sigmoid diverticulosis. Electronically Signed   By: AFidela SalisburyM.D.   On: 05/04/2022 22:33    Pending Labs Unresulted Labs (From admission, onward)    None       Vitals/Pain Today's Vitals   05/05/22 0345 05/05/22 0622201/20/24 [id number]  05/05/22 0650  BP:  (!) 176/100  (!) 153/95  Pulse:  84  79  Resp:  18  18  Temp:  97.9 F (36.6 C)  98.5 F (36.9 C)  TempSrc:  Oral  Oral  SpO2:  100%  100%  Weight:      Height:      PainSc: 5   6      Isolation Precautions No active isolations  Medications Medications  ondansetron (ZOFRAN-ODT) disintegrating tablet 8 mg (8 mg Oral Given 05/04/22 1829)  oxyCODONE-acetaminophen (PERCOCET/ROXICET) 5-325 MG per tablet 1 tablet (1 tablet Oral Given 05/04/22 1829)  iohexol (OMNIPAQUE) 350 MG/ML injection 75 mL (75 mLs Intravenous Contrast Given 05/04/22 2221)  lactated ringers bolus 1,000 mL (0 mLs Intravenous Stopped 05/05/22 0520)  lactated ringers bolus 1,000 mL (0 mLs Intravenous Stopped 05/05/22 0359)  ondansetron (ZOFRAN) injection 4 mg (4 mg Intravenous Given 05/05/22 0158)  HYDROmorphone (DILAUDID) injection 1 mg (1 mg Intravenous Given 05/05/22 0158)  HYDROmorphone (DILAUDID) injection 1 mg (1 mg Intravenous Given 05/05/22 0433)  prochlorperazine (COMPAZINE) injection 10 mg (10 mg Intravenous Given 05/05/22 0433)  HYDROmorphone (DILAUDID) injection 1 mg (1 mg Intravenous Given 05/05/22 0650)  ondansetron (ZOFRAN) injection 4 mg (4 mg Intravenous Given 05/05/22 5102)    Mobility walks     Focused Assessments Nausea   R Recommendations: See Admitting Provider Note  Report given to:   Additional Notes:

## 2022-05-06 DIAGNOSIS — K920 Hematemesis: Secondary | ICD-10-CM | POA: Diagnosis not present

## 2022-05-06 DIAGNOSIS — K859 Acute pancreatitis without necrosis or infection, unspecified: Secondary | ICD-10-CM | POA: Diagnosis not present

## 2022-05-06 DIAGNOSIS — K8689 Other specified diseases of pancreas: Secondary | ICD-10-CM | POA: Diagnosis not present

## 2022-05-06 DIAGNOSIS — I10 Essential (primary) hypertension: Secondary | ICD-10-CM

## 2022-05-06 LAB — CBC WITH DIFFERENTIAL/PLATELET
Abs Immature Granulocytes: 0.01 10*3/uL (ref 0.00–0.07)
Basophils Absolute: 0 10*3/uL (ref 0.0–0.1)
Basophils Relative: 0 %
Eosinophils Absolute: 0.1 10*3/uL (ref 0.0–0.5)
Eosinophils Relative: 2 %
HCT: 32.6 % — ABNORMAL LOW (ref 39.0–52.0)
Hemoglobin: 11.3 g/dL — ABNORMAL LOW (ref 13.0–17.0)
Immature Granulocytes: 0 %
Lymphocytes Relative: 32 %
Lymphs Abs: 2.1 10*3/uL (ref 0.7–4.0)
MCH: 34 pg (ref 26.0–34.0)
MCHC: 34.7 g/dL (ref 30.0–36.0)
MCV: 98.2 fL (ref 80.0–100.0)
Monocytes Absolute: 0.4 10*3/uL (ref 0.1–1.0)
Monocytes Relative: 6 %
Neutro Abs: 3.9 10*3/uL (ref 1.7–7.7)
Neutrophils Relative %: 60 %
Platelets: 143 10*3/uL — ABNORMAL LOW (ref 150–400)
RBC: 3.32 MIL/uL — ABNORMAL LOW (ref 4.22–5.81)
RDW: 15.5 % (ref 11.5–15.5)
WBC: 6.6 10*3/uL (ref 4.0–10.5)
nRBC: 0 % (ref 0.0–0.2)

## 2022-05-06 LAB — COMPREHENSIVE METABOLIC PANEL
ALT: 15 U/L (ref 0–44)
AST: 24 U/L (ref 15–41)
Albumin: 3 g/dL — ABNORMAL LOW (ref 3.5–5.0)
Alkaline Phosphatase: 58 U/L (ref 38–126)
Anion gap: 10 (ref 5–15)
BUN: 10 mg/dL (ref 6–20)
CO2: 25 mmol/L (ref 22–32)
Calcium: 8.8 mg/dL — ABNORMAL LOW (ref 8.9–10.3)
Chloride: 103 mmol/L (ref 98–111)
Creatinine, Ser: 1.15 mg/dL (ref 0.61–1.24)
GFR, Estimated: >60 mL/min (ref >60)
Glucose, Bld: 123 mg/dL — ABNORMAL HIGH (ref 70–99)
Potassium: 3.3 mmol/L — ABNORMAL LOW (ref 3.5–5.1)
Sodium: 138 mmol/L (ref 135–145)
Total Bilirubin: 0.7 mg/dL (ref 0.3–1.2)
Total Protein: 5.7 g/dL — ABNORMAL LOW (ref 6.5–8.1)

## 2022-05-06 LAB — MAGNESIUM: Magnesium: 1.6 mg/dL — ABNORMAL LOW (ref 1.7–2.4)

## 2022-05-06 MED ORDER — POTASSIUM CHLORIDE 10 MEQ/100ML IV SOLN
10.0000 meq | INTRAVENOUS | Status: AC
  Administered 2022-05-06 (×4): 10 meq via INTRAVENOUS
  Filled 2022-05-06 (×4): qty 100

## 2022-05-06 MED ORDER — IRBESARTAN 150 MG PO TABS
150.0000 mg | ORAL_TABLET | Freq: Every day | ORAL | Status: DC
  Administered 2022-05-06 – 2022-05-10 (×5): 150 mg via ORAL
  Filled 2022-05-06 (×5): qty 1

## 2022-05-06 MED ORDER — MAGNESIUM SULFATE 2 GM/50ML IV SOLN
2.0000 g | Freq: Once | INTRAVENOUS | Status: AC
  Administered 2022-05-06: 2 g via INTRAVENOUS
  Filled 2022-05-06: qty 50

## 2022-05-06 MED ORDER — HYDROMORPHONE HCL 1 MG/ML IJ SOLN
1.0000 mg | INTRAMUSCULAR | Status: DC | PRN
  Administered 2022-05-06 – 2022-05-11 (×31): 1 mg via INTRAVENOUS
  Filled 2022-05-06 (×31): qty 1

## 2022-05-06 NOTE — Progress Notes (Signed)
Progress Note    Kyle Berry   UVO:[id number]  DOB: 1970-04-17  DOA: 05/04/2022     1 PCP: [facility]  Initial CC: abd pain, N/V  Hospital Course: Kyle Berry is a 52 yo male with PMH chronic pancreatitis (h/o pancreas divisum and s/p ERCP with biliary duct sphincterotomy and pancreatic duct placement at [facility], removed on 7/20), solitary left kidney, HTN. He presented with worsening abdominal pain, N/V x 3 weeks, decreased urine output, and some recent blood mixed with vomitus.  CT A/P noting acute on chronic pancreatitis; no necrosis or fluid collections. Mild left nonobstructing hydronephrosis.  Lipase 45. Etoh level 86 (patient endorsed some drinking some beer to help urinate).  Creatinine normal, 1.22 and Hgb stable 12.8 g/dL.  Patient admitted for IVF, bowel rest, pain/nausea control, and monitoring for any further hematemesis.   Interval History:  No events overnight.  He did have episode of emesis this morning.  Still having ongoing abdominal pain.  No big changes today, continuing fluids and pain/nausea control.  Patient ambulating easily.  Assessment and Plan: * Acute on chronic pancreatitis ([place]) - last hospitalized 03/07/22 for prior flare -CT abdomen/pelvis shows "stable changes of acute on chronic pancreatitis".  No peripancreatic necrosis or fluid collections noted - Ethanol level 86.  Lipase 45 - keep NPO for now - continue IVF - pain and nausea control  Hematemesis-resolved as of 05/06/2022 - vitals and Hgb stable - possible irritation from emesis vs MW tear vs other - monitor for further recurrence; no further episodes since admission  Pancreatic insufficiency - will restart pancreatic enzymes once diet resumes  Essential hypertension - labetalol or hydralazine PRN -Will resume ARB to see if he can tolerate  Esophageal reflux - will resume home meds when able   Old records reviewed in assessment of this  patient  Antimicrobials:   DVT prophylaxis:  PADUA less than 4   Code Status:   Code Status: Full Code  Mobility Assessment (last 72 hours)     Mobility Assessment     Row Name 05/05/22 1130           Does patient have an order for bedrest or is patient medically unstable No - Continue assessment       What is the highest level of mobility based on the progressive mobility assessment? Level 6 (Walks independently in room and hall) - Balance while walking in room without assist - Complete                Barriers to discharge:  Disposition Plan: Home 2 to 3 days Status is: Inpatient  Objective: Blood pressure 128/85, pulse 74, temperature 98.7 F (37.1 C), resp. rate 16, height '6\' 1"'$  (1.854 m), weight 74.8 kg, SpO2 99 %.  Examination:  Physical Exam Constitutional:      General: He is not in acute distress.    Appearance: Normal appearance.  HENT:     Head: Normocephalic and atraumatic.     Mouth/Throat:     Mouth: Mucous membranes are moist.  Eyes:     Extraocular Movements: Extraocular movements intact.  Pulmonary:     Effort: Pulmonary effort is normal.     Breath sounds: Normal breath sounds.  Abdominal:     General: Bowel sounds are normal. There is no distension.     Palpations: Abdomen is soft.     Tenderness: There is abdominal tenderness.     Comments: LUQ TTP, no R/G  Musculoskeletal:  General: No swelling. Normal range of motion.     Cervical back: Normal range of motion and neck supple.  Skin:    General: Skin is warm and dry.  Neurological:     General: No focal deficit present.     Mental Status: He is alert.  Psychiatric:        Mood and Affect: Mood normal.      Consultants:    Procedures:    Data Reviewed: Results for orders placed or performed during the hospital encounter of 05/04/22 (from the past 24 hour(s))  CBC with Differential/Platelet     Status: Abnormal   Collection Time: 05/06/22  4:36 AM  Result Value Ref  Range   WBC 6.6 4.0 - 10.5 K/uL   RBC 3.32 (L) 4.22 - 5.81 MIL/uL   Hemoglobin 11.3 (L) 13.0 - 17.0 g/dL   HCT 32.6 (L) 39.0 - 52.0 %   MCV 98.2 80.0 - 100.0 fL   MCH 34.0 26.0 - 34.0 pg   MCHC 34.7 30.0 - 36.0 g/dL   RDW 15.5 11.5 - 15.5 %   Platelets 143 (L) 150 - 400 K/uL   nRBC 0.0 0.0 - 0.2 %   Neutrophils Relative % 60 %   Neutro Abs 3.9 1.7 - 7.7 K/uL   Lymphocytes Relative 32 %   Lymphs Abs 2.1 0.7 - 4.0 K/uL   Monocytes Relative 6 %   Monocytes Absolute 0.4 0.1 - 1.0 K/uL   Eosinophils Relative 2 %   Eosinophils Absolute 0.1 0.0 - 0.5 K/uL   Basophils Relative 0 %   Basophils Absolute 0.0 0.0 - 0.1 K/uL   Immature Granulocytes 0 %   Abs Immature Granulocytes 0.01 0.00 - 0.07 K/uL  Comprehensive metabolic panel     Status: Abnormal   Collection Time: 05/06/22  4:36 AM  Result Value Ref Range   Sodium 138 135 - 145 mmol/L   Potassium 3.3 (L) 3.5 - 5.1 mmol/L   Chloride 103 98 - 111 mmol/L   CO2 25 22 - 32 mmol/L   Glucose, Bld 123 (H) 70 - 99 mg/dL   BUN 10 6 - 20 mg/dL   Creatinine, Ser 1.15 0.61 - 1.24 mg/dL   Calcium 8.8 (L) 8.9 - 10.3 mg/dL   Total Protein 5.7 (L) 6.5 - 8.1 g/dL   Albumin 3.0 (L) 3.5 - 5.0 g/dL   AST 24 15 - 41 U/L   ALT 15 0 - 44 U/L   Alkaline Phosphatase 58 38 - 126 U/L   Total Bilirubin 0.7 0.3 - 1.2 mg/dL   GFR, Estimated >60 >60 mL/min   Anion gap 10 5 - 15  Magnesium     Status: Abnormal   Collection Time: 05/06/22  4:36 AM  Result Value Ref Range   Magnesium 1.6 (L) 1.7 - 2.4 mg/dL    I have reviewed pertinent nursing notes, vitals, labs, and images as necessary. I have ordered labwork to follow up on as indicated.  I have reviewed the last notes from staff over past 24 hours. I have discussed patient's care plan and test results with nursing staff, CM/SW, and other staff as appropriate.  Time spent: Greater than 50% of the 55 minute visit was spent in counseling/coordination of care for the patient as laid out in the A&P.    LOS: 1 day   Dwyane Dee, MD [facility] 05/06/2022, 12:15 PM

## 2022-05-07 DIAGNOSIS — K219 Gastro-esophageal reflux disease without esophagitis: Secondary | ICD-10-CM

## 2022-05-07 DIAGNOSIS — K861 Other chronic pancreatitis: Secondary | ICD-10-CM | POA: Diagnosis not present

## 2022-05-07 DIAGNOSIS — I10 Essential (primary) hypertension: Secondary | ICD-10-CM | POA: Diagnosis not present

## 2022-05-07 DIAGNOSIS — K859 Acute pancreatitis without necrosis or infection, unspecified: Secondary | ICD-10-CM | POA: Diagnosis not present

## 2022-05-07 LAB — COMPREHENSIVE METABOLIC PANEL
ALT: 19 U/L (ref 0–44)
AST: 26 U/L (ref 15–41)
Albumin: 3.3 g/dL — ABNORMAL LOW (ref 3.5–5.0)
Alkaline Phosphatase: 49 U/L (ref 38–126)
Anion gap: 8 (ref 5–15)
BUN: 5 mg/dL — ABNORMAL LOW (ref 6–20)
CO2: 28 mmol/L (ref 22–32)
Calcium: 9 mg/dL (ref 8.9–10.3)
Chloride: 100 mmol/L (ref 98–111)
Creatinine, Ser: 1.21 mg/dL (ref 0.61–1.24)
GFR, Estimated: >60 mL/min (ref >60)
Glucose, Bld: 109 mg/dL — ABNORMAL HIGH (ref 70–99)
Potassium: 4.1 mmol/L (ref 3.5–5.1)
Sodium: 136 mmol/L (ref 135–145)
Total Bilirubin: 0.8 mg/dL (ref 0.3–1.2)
Total Protein: 6.3 g/dL — ABNORMAL LOW (ref 6.5–8.1)

## 2022-05-07 LAB — CBC WITH DIFFERENTIAL/PLATELET
Abs Immature Granulocytes: 0.01 10*3/uL (ref 0.00–0.07)
Basophils Absolute: 0 10*3/uL (ref 0.0–0.1)
Basophils Relative: 0 %
Eosinophils Absolute: 0.1 10*3/uL (ref 0.0–0.5)
Eosinophils Relative: 2 %
HCT: 35.2 % — ABNORMAL LOW (ref 39.0–52.0)
Hemoglobin: 11.9 g/dL — ABNORMAL LOW (ref 13.0–17.0)
Immature Granulocytes: 0 %
Lymphocytes Relative: 35 %
Lymphs Abs: 2.1 10*3/uL (ref 0.7–4.0)
MCH: 33.8 pg (ref 26.0–34.0)
MCHC: 33.8 g/dL (ref 30.0–36.0)
MCV: 100 fL (ref 80.0–100.0)
Monocytes Absolute: 0.4 10*3/uL (ref 0.1–1.0)
Monocytes Relative: 6 %
Neutro Abs: 3.5 10*3/uL (ref 1.7–7.7)
Neutrophils Relative %: 57 %
Platelets: 153 10*3/uL (ref 150–400)
RBC: 3.52 MIL/uL — ABNORMAL LOW (ref 4.22–5.81)
RDW: 15.5 % (ref 11.5–15.5)
WBC: 6.1 10*3/uL (ref 4.0–10.5)
nRBC: 0 % (ref 0.0–0.2)

## 2022-05-07 LAB — LIPID PANEL
Cholesterol: 190 mg/dL (ref 0–200)
HDL: 84 mg/dL (ref >40)
LDL Cholesterol: 91 mg/dL (ref 0–99)
Total CHOL/HDL Ratio: 2.3 RATIO
Triglycerides: 75 mg/dL (ref <150)
VLDL: 15 mg/dL (ref 0–40)

## 2022-05-07 LAB — MAGNESIUM: Magnesium: 1.8 mg/dL (ref 1.7–2.4)

## 2022-05-07 LAB — LIPASE, BLOOD: Lipase: 25 U/L (ref 11–51)

## 2022-05-07 MED ORDER — FAMOTIDINE 20 MG PO TABS
20.0000 mg | ORAL_TABLET | Freq: Two times a day (BID) | ORAL | Status: DC
  Administered 2022-05-07 – 2022-05-11 (×8): 20 mg via ORAL
  Filled 2022-05-07 (×8): qty 1

## 2022-05-07 NOTE — Progress Notes (Signed)
Progress Note    Kyle Berry   ZOX:[id number]  DOB: 13-Jul-1970  DOA: 05/04/2022     2 PCP: [facility]  Initial CC: abd pain, N/V  Hospital Course: Mr. Kyle Berry is a 52 yo male with PMH chronic pancreatitis (h/o pancreas divisum and s/p ERCP with biliary duct sphincterotomy and pancreatic duct placement at [facility], removed on 7/20), solitary left kidney, HTN. He presented with worsening abdominal pain, N/V x 3 weeks, decreased urine output, and some recent blood mixed with vomitus.  CT A/P noting acute on chronic pancreatitis; no necrosis or fluid collections. Mild left nonobstructing hydronephrosis.  Lipase 45. Etoh level 86 (patient endorsed some drinking some beer to help urinate).  Creatinine normal, 1.22 and Hgb stable 12.8 g/dL.  Patient admitted for IVF, bowel rest, pain/nausea control, and monitoring for any further hematemesis.   Interval History:  No events overnight.  Still having some pain but appears to be subjectively better.  He was worried something else may be going on this morning, tried providing reassurance. Lipase trend also downtrending since admission.  He is amenable with trial of clear liquids today.  Assessment and Plan: * Acute on chronic pancreatitis ([place]) - last hospitalized 03/07/22 for prior flare -CT abdomen/pelvis shows "stable changes of acute on chronic pancreatitis".  No peripancreatic necrosis or fluid collections noted - Ethanol level 86.  Lipase 45 initially - TG normal, 75 -Symptomatically feeling better today.  Amenable for trial of clear liquids -Continue fluids for now - pain and nausea control  Hematemesis-resolved as of 05/06/2022 - vitals and Hgb stable - possible irritation from emesis vs MW tear vs other - monitor for further recurrence; no further episodes since admission  Pancreatic insufficiency - will restart pancreatic enzymes once diet resumes  Essential hypertension - labetalol or  hydralazine PRN -Continue ARB.  Tolerating  Esophageal reflux - will resume home meds when able   Old records reviewed in assessment of this patient  Antimicrobials:   DVT prophylaxis:  PADUA less than 4   Code Status:   Code Status: Full Code  Mobility Assessment (last 72 hours)     Mobility Assessment     Row Name 05/06/22 1100 05/05/22 1130         Does patient have an order for bedrest or is patient medically unstable No - Continue assessment No - Continue assessment      What is the highest level of mobility based on the progressive mobility assessment? Level 6 (Walks independently in room and hall) - Balance while walking in room without assist - Complete Level 6 (Walks independently in room and hall) - Balance while walking in room without assist - Complete               Barriers to discharge:  Disposition Plan: Home 2 to 3 days Status is: Inpatient  Objective: Blood pressure (!) 148/87, pulse 67, temperature 98.9 F (37.2 C), resp. rate 18, height '6\' 1"'$  (1.854 m), weight 74.8 kg, SpO2 99 %.  Examination:  Physical Exam Constitutional:      General: He is not in acute distress.    Appearance: Normal appearance.  HENT:     Head: Normocephalic and atraumatic.     Mouth/Throat:     Mouth: Mucous membranes are moist.  Eyes:     Extraocular Movements: Extraocular movements intact.  Pulmonary:     Effort: Pulmonary effort is normal.     Breath sounds: Normal breath sounds.  Abdominal:  General: Bowel sounds are normal. There is no distension.     Palpations: Abdomen is soft.     Tenderness: There is abdominal tenderness.     Comments: LUQ TTP, no R/G  Musculoskeletal:        General: No swelling. Normal range of motion.     Cervical back: Normal range of motion and neck supple.  Skin:    General: Skin is warm and dry.  Neurological:     General: No focal deficit present.     Mental Status: He is alert.  Psychiatric:        Mood and Affect:  Mood normal.      Consultants:    Procedures:    Data Reviewed: Results for orders placed or performed during the hospital encounter of 05/04/22 (from the past 24 hour(s))  CBC with Differential/Platelet     Status: Abnormal   Collection Time: 05/07/22  3:52 AM  Result Value Ref Range   WBC 6.1 4.0 - 10.5 K/uL   RBC 3.52 (L) 4.22 - 5.81 MIL/uL   Hemoglobin 11.9 (L) 13.0 - 17.0 g/dL   HCT 35.2 (L) 39.0 - 52.0 %   MCV 100.0 80.0 - 100.0 fL   MCH 33.8 26.0 - 34.0 pg   MCHC 33.8 30.0 - 36.0 g/dL   RDW 15.5 11.5 - 15.5 %   Platelets 153 150 - 400 K/uL   nRBC 0.0 0.0 - 0.2 %   Neutrophils Relative % 57 %   Neutro Abs 3.5 1.7 - 7.7 K/uL   Lymphocytes Relative 35 %   Lymphs Abs 2.1 0.7 - 4.0 K/uL   Monocytes Relative 6 %   Monocytes Absolute 0.4 0.1 - 1.0 K/uL   Eosinophils Relative 2 %   Eosinophils Absolute 0.1 0.0 - 0.5 K/uL   Basophils Relative 0 %   Basophils Absolute 0.0 0.0 - 0.1 K/uL   Immature Granulocytes 0 %   Abs Immature Granulocytes 0.01 0.00 - 0.07 K/uL  Comprehensive metabolic panel     Status: Abnormal   Collection Time: 05/07/22  3:52 AM  Result Value Ref Range   Sodium 136 135 - 145 mmol/L   Potassium 4.1 3.5 - 5.1 mmol/L   Chloride 100 98 - 111 mmol/L   CO2 28 22 - 32 mmol/L   Glucose, Bld 109 (H) 70 - 99 mg/dL   BUN 5 (L) 6 - 20 mg/dL   Creatinine, Ser 1.21 0.61 - 1.24 mg/dL   Calcium 9.0 8.9 - 10.3 mg/dL   Total Protein 6.3 (L) 6.5 - 8.1 g/dL   Albumin 3.3 (L) 3.5 - 5.0 g/dL   AST 26 15 - 41 U/L   ALT 19 0 - 44 U/L   Alkaline Phosphatase 49 38 - 126 U/L   Total Bilirubin 0.8 0.3 - 1.2 mg/dL   GFR, Estimated >60 >60 mL/min   Anion gap 8 5 - 15  Magnesium     Status: None   Collection Time: 05/07/22  3:52 AM  Result Value Ref Range   Magnesium 1.8 1.7 - 2.4 mg/dL  Lipase, blood     Status: None   Collection Time: 05/07/22  3:52 AM  Result Value Ref Range   Lipase 25 11 - 51 U/L  Lipid panel     Status: None   Collection Time: 05/07/22  3:52  AM  Result Value Ref Range   Cholesterol 190 0 - 200 mg/dL   Triglycerides 75 <150 mg/dL   HDL 84 >40  mg/dL   Total CHOL/HDL Ratio 2.3 RATIO   VLDL 15 0 - 40 mg/dL   LDL Cholesterol 91 0 - 99 mg/dL    I have reviewed pertinent nursing notes, vitals, labs, and images as necessary. I have ordered labwork to follow up on as indicated.  I have reviewed the last notes from staff over past 24 hours. I have discussed patient's care plan and test results with nursing staff, CM/SW, and other staff as appropriate.  Time spent: Greater than 50% of the 55 minute visit was spent in counseling/coordination of care for the patient as laid out in the A&P.   LOS: 2 days   Dwyane Dee, MD [facility] 05/07/2022, 3:29 PM

## 2022-05-07 NOTE — Progress Notes (Signed)
  Transition of Care [facility]) Screening Note   Patient Details  Name: Kyle Berry Date of Birth: 1971-04-02   Transition of Care [facility]) CM/SW Contact:    Bartholomew Crews, RN Phone Number: [phone] 05/07/2022, 8:36 AM    Transition of Care Department [facility]) has reviewed patient and no TOC needs have been identified at this time. We will continue to monitor patient advancement through interdisciplinary progression rounds. If new patient transition needs arise, please place a TOC consult.

## 2022-05-08 DIAGNOSIS — K920 Hematemesis: Secondary | ICD-10-CM | POA: Diagnosis not present

## 2022-05-08 DIAGNOSIS — K861 Other chronic pancreatitis: Secondary | ICD-10-CM | POA: Diagnosis not present

## 2022-05-08 DIAGNOSIS — K859 Acute pancreatitis without necrosis or infection, unspecified: Secondary | ICD-10-CM | POA: Diagnosis not present

## 2022-05-08 DIAGNOSIS — R040 Epistaxis: Secondary | ICD-10-CM

## 2022-05-08 DIAGNOSIS — I10 Essential (primary) hypertension: Secondary | ICD-10-CM | POA: Diagnosis not present

## 2022-05-08 LAB — COMPREHENSIVE METABOLIC PANEL
ALT: 21 U/L (ref 0–44)
AST: 31 U/L (ref 15–41)
Albumin: 3.3 g/dL — ABNORMAL LOW (ref 3.5–5.0)
Alkaline Phosphatase: 60 U/L (ref 38–126)
Anion gap: 8 (ref 5–15)
BUN: 7 mg/dL (ref 6–20)
CO2: 27 mmol/L (ref 22–32)
Calcium: 9.1 mg/dL (ref 8.9–10.3)
Chloride: 98 mmol/L (ref 98–111)
Creatinine, Ser: 1.43 mg/dL — ABNORMAL HIGH (ref 0.61–1.24)
GFR, Estimated: 59 mL/min — ABNORMAL LOW (ref >60)
Glucose, Bld: 112 mg/dL — ABNORMAL HIGH (ref 70–99)
Potassium: 3.8 mmol/L (ref 3.5–5.1)
Sodium: 133 mmol/L — ABNORMAL LOW (ref 135–145)
Total Bilirubin: 0.5 mg/dL (ref 0.3–1.2)
Total Protein: 6.4 g/dL — ABNORMAL LOW (ref 6.5–8.1)

## 2022-05-08 LAB — CBC WITH DIFFERENTIAL/PLATELET
Abs Immature Granulocytes: 0.01 10*3/uL (ref 0.00–0.07)
Basophils Absolute: 0 10*3/uL (ref 0.0–0.1)
Basophils Relative: 0 %
Eosinophils Absolute: 0.1 10*3/uL (ref 0.0–0.5)
Eosinophils Relative: 2 %
HCT: 37.4 % — ABNORMAL LOW (ref 39.0–52.0)
Hemoglobin: 12.3 g/dL — ABNORMAL LOW (ref 13.0–17.0)
Immature Granulocytes: 0 %
Lymphocytes Relative: 31 %
Lymphs Abs: 1.7 10*3/uL (ref 0.7–4.0)
MCH: 33.4 pg (ref 26.0–34.0)
MCHC: 32.9 g/dL (ref 30.0–36.0)
MCV: 101.6 fL — ABNORMAL HIGH (ref 80.0–100.0)
Monocytes Absolute: 0.4 10*3/uL (ref 0.1–1.0)
Monocytes Relative: 8 %
Neutro Abs: 3.2 10*3/uL (ref 1.7–7.7)
Neutrophils Relative %: 59 %
Platelets: 164 10*3/uL (ref 150–400)
RBC: 3.68 MIL/uL — ABNORMAL LOW (ref 4.22–5.81)
RDW: 15.7 % — ABNORMAL HIGH (ref 11.5–15.5)
WBC: 5.4 10*3/uL (ref 4.0–10.5)
nRBC: 0 % (ref 0.0–0.2)

## 2022-05-08 LAB — MAGNESIUM: Magnesium: 1.7 mg/dL (ref 1.7–2.4)

## 2022-05-08 MED ORDER — PANTOPRAZOLE SODIUM 40 MG PO TBEC
40.0000 mg | DELAYED_RELEASE_TABLET | Freq: Every day | ORAL | Status: DC
  Administered 2022-05-08 – 2022-05-11 (×4): 40 mg via ORAL
  Filled 2022-05-08 (×4): qty 1

## 2022-05-08 MED ORDER — PANTOPRAZOLE SODIUM 40 MG PO TBEC: 40.0000 mg | DELAYED_RELEASE_TABLET | Freq: Every day | ORAL | Status: DC

## 2022-05-08 NOTE — Consult Note (Addendum)
[facility] Consult: 2:35 PM 05/08/2022  LOS: 3 days    Referring Provider: DR Sabino Gasser  Primary Care Physician:  [place] Primary Gastroenterologist: Previously Dr. Silverio Decamp but more recently doctors Spaete and Cleda Mccreedy at [facility].      Reason for Consultation:  Limited hematemesis.     HPI: Kyle Berry is a 52 y.o. male.  PMH solitary left kidney.  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   Suspicion is ETOH pancreatitis, PEth livel 1473 in 11/2021 which is positive for ETOH (>200 ng/mL: Heavy alcohol consumption or chronic alcohol use (at least 4 drinks per day several days a week).  Lap Cholecystectomy 2019.    10/2016 MRCP showed acute pancreatitis, suspicion for pancreas divisum, no evidence of chronic pancreatitis. 08/2016 MRI/MRCP raised question of chronic pancreatitis with focally dilated portion of dorsal pancreatic duct and fused mild irregularity of pancreatic duct.  05/27/2019 abdominal ultrasound: mild dilation of PD to 4 mm.  No fluid collections or abdominal free fluid.  Mild biliary ductal prominence normal post cholecystectomy.   CTAP 10/14/2019: Mild inflammatory changes at the pancreatic body and tail suggesting acute pancreatitis.  No pseudocyst.  Stable uncinate process calcification.  Severe, stable right kidney atrophy.  Nonobstructing left renal calculi.    Investigation of N/V, abd pain w: 10/14/19 CTAP w contrast: mild inflammation pancreatic bodyand tail.  No pseudocyst. Stable calcification at uncinate.  Lipase 6/30: 197 10/30/19 EGD: normal z line, gastritis (path: mild, chronic gastritis w/o H pylori)  10/30/19 colonoscopy. Fair prep.  20 mm cecal polyp hot snared.  30 mm rectomsigmoid polyp hot snared and site clipped.  Smaller  polyps removed as well. Diverticulosis and int rrhoids noted. Path on all polyps: TA w/o HGD.    Post polypectomy bleed, admitted 7/18 - 11/02/2019 Hgb 12.8 >> 11.5. 11/02/19 colonoscopy: ulcer and pigmented protuberance at cecum, site clipped x 4. Large polypectomy with pigemented protuberance at sigmoid also clipped x 3. Diverticulosis.  11/06/19 EGD:  for Hematemesis, hypotension, Hgb 8.4 (1 PRBC). Mild gastropathy.   01/2020 MRI abdomen/MRCP: Acute pancreatitis with stranding at the tail of the pancreas tracking to left paracolic gutter below the spleen.  Pancreas disease him.  Mild CBD dilatation measuring up to 9 mm which tapers distally in conical fashion.  Some of this dilatation may be physiologic postcholecystectomy response.  No choledocholithiasis.  Several tiny hepatic cysts.  Compensatory left kidney hypertrophy.  Some motion artifact reducing exam sensitivity and specificity 05/31/2019 MRCP: stable, peripancreatic edema, mild c/w with acute on chronic pancreatitis. Pancreas divisum, without duct dilatation.  R renal atrophy.   Home meds include Pepcid, creon.   06/2020 gastric emptying study: Solid gastric emptying at 59 minutes is 19% (normal range at 60 minutes is>10%).  Solid gastric emptying at 121 minutes is 38% (normal range at 120 minutes is >40%).  Solid gastric emptying at 242 minutes is 63% (normal range at 240 minutes is >90%). 08/19/20 ERCP w stent to PD.  Dr Cephas Darby at [facility].  S/p sphincterotomy (to cover  in case of SOD).  S/p PD sphincterotomy.  Upper 3rd of bile duct dilated.   Mild narrowing within the ansa loop without clear stricture.  4 Fr by 2 cm plastic stent with two external flaps and no internal flaps placed into ventral pancreatic duct.  Given the ansa loop seen and concern for contribution  from possible incomplete divisum the decision was made  to pursue cannulation of the dorsal duct.  Stricture not seen within the dorsal pancreatic duct. pancreatic sphincterotomy  performed. 4 Fr by 5 cm plastic stent placed into  the dorsal pancreatic duct.  10/30/20 CT, renal stone study: Mild acute pancreatitis, findings very similar to CT 08/10/2020.  Consider chronic pancreatitis or acute on chronic pancreatitis.   New pancreatic duct stent.  2 nonobstructing LEFT renal calculi. No ureterolithiasis or obstructive uropathy.   Severely atrophic RIGHT kidney. 11/02/2020 ERCP/EGD:  Dr. Rush Landmark:    - No gross lesions in esophagus. Z-line irregular, 43 cm from the incisors. - Gastritis. Biopsied. (Path: Gastric biopsies were benign with mild reactive changes, no H. Pylori) - Gastric polyps. Biopsied. (Path: fundic gland polyp) - Erythematous duodenopathy in bulb. - Prior minor papilla sphincterotomy appeared open. Stent in the minor papilla was removed. - Prior biliary sphincterotomy appeared open. - Prior pancreatic sphincterotomy appeared open. - Filling defects consistent with mucin vs pancreatic stones from recent retained stent on the pancreatogram. - Incomplete pancreas divisum. - Pancreatic stones were found and removed through the minor duct. Complete removal accomplished via balloon sweeps.  06/22/2021 evaluation for epigastric pain typical for pancreatitis, large episode hematochezia.  CT showed moderate, uncomplicated pancreatitis.  Hgb between 12 and 13.  GI providers suspected limited diverticular bleed. 01/02/2022 MRCP:     Latest GI office visit was 05/04/2022 at [facility], just 4 days ago.  Several weeks of nausea and vomiting, sometimes with streaks of blood but in recent days had a large amount of blood.  Has not been taking PPI or H2 blocker.  Has Pepcid which he uses as needed but had not used it recently.  For 3 weeks  will has been unable to keep down for food or fluids.  Admitted to drinking at least 1 glass of wine. Dr. Cleda Mccreedy, GI MD looked into possibly admitting him to [facility] and performing endoscopy but there were 38 people in the waiting room there so he  advised him to go to his local ED.  He was going to arrange for outpatient EGD.  Pt presented to [facility] later that day.  He is now day 5.  FL diet ordered.  Better toleration of food.  No melena or bleeding per rectum.  Chronic abdominal pain, not using NSAIDs or aspirin.  Periodic epistaxis but none in recent days.   Sodium 133.  GFR 59.  T. bili, alk phos, transaminases normal. GGT elevated 304.  Lipase normal.  Glucose 123. Hgb 12.3.   CTAP with contrast shows changes of acute on chronic pancreatitis without necrosis or peripancreatic fluid collections.  Marked right kidney atrophy with compensatory left kidney hypertrophy.  Mild left nonobstructing nephrolithiasis.  Mild sigmoid diverticulosis. Hypertensive diastolic pressures, no tachycardia, no hypoxia, no fever.  Nose bleeds, blood tinged fluid w burping.    Past Medical History:  Diagnosis Date   Atrophy of right kidney    Chronic bronchitis (HCC)    Chronic lower back pain    Chronic pancreatitis ([place])    Gastroparesis 06/2020   confirmed by emptying study at [facility]  GERD (gastroesophageal reflux disease)    Headache    "once/month" (05/13/2017)   High cholesterol    Hypertension    Migraine    "a couple/year" (05/13/2017)   Nephrolithiasis 05/13/2017   Pancreatitis    Pneumonia ~ 09/2015   Presence of pancreatic duct stent    Recurrent acute pancreatitis    Shortness of breath 05/22/2021    Past Surgical History:  Procedure Laterality Date   BIOPSY  11/02/2020   Procedure: BIOPSY;  Surgeon: Rush Landmark Telford Nab., MD;  Location: [place];  Service: Gastroenterology;;   CHOLECYSTECTOMY N/A 10/23/2017   Procedure: LAPAROSCOPIC CHOLECYSTECTOMY WITH INTRAOPERATIVE CHOLANGIOGRAM;  Surgeon: Johnathan Hausen, MD;  Location: [facility];  Service: General;  Laterality: N/A;   COLONOSCOPY WITH PROPOFOL N/A 11/02/2019   Procedure: COLONOSCOPY WITH PROPOFOL;  Surgeon: Irene Shipper, MD;  Location: [facility] ENDOSCOPY;  Service:  Endoscopy;  Laterality: N/A;   ERCP N/A 11/02/2020   Procedure: ENDOSCOPIC RETROGRADE CHOLANGIOPANCREATOGRAPHY (ERCP);  Surgeon: Irving Copas., MD;  Location: [place];  Service: Gastroenterology;  Laterality: N/A;   ESOPHAGOGASTRODUODENOSCOPY N/A 11/02/2020   Procedure: ESOPHAGOGASTRODUODENOSCOPY (EGD);  Surgeon: Irving Copas., MD;  Location: [place];  Service: Gastroenterology;  Laterality: N/A;   ESOPHAGOGASTRODUODENOSCOPY (EGD) WITH PROPOFOL N/A 11/06/2019   Procedure: ESOPHAGOGASTRODUODENOSCOPY (EGD) WITH PROPOFOL;  Surgeon: Irene Shipper, MD;  Location: [facility] ENDOSCOPY;  Service: Endoscopy;  Laterality: N/A;   HEMOSTASIS CLIP PLACEMENT  11/02/2019   Procedure: HEMOSTASIS CLIP PLACEMENT;  Surgeon: Irene Shipper, MD;  Location: [facility] ENDOSCOPY;  Service: Endoscopy;;   NO PAST SURGERIES     REMOVAL OF STONES  11/02/2020   Procedure: REMOVAL OF STONES;  Surgeon: Irving Copas., MD;  Location: [place];  Service: Gastroenterology;;   Lavell Islam REMOVAL  11/02/2020   Procedure: STENT REMOVAL;  Surgeon: Irving Copas., MD;  Location: [place];  Service: Gastroenterology;;    Prior to Admission medications   Medication Sig Start Date End Date Taking? Authorizing Provider  albuterol (VENTOLIN HFA) 108 (90 Base) MCG/ACT inhaler Inhale 2 puffs into the lungs every 6 (six) hours as needed for wheezing or shortness of breath.   Yes [provider]  famotidine (PEPCID) 20 MG tablet Take 1 tablet (20 mg total) by mouth 2 (two) times daily. Patient taking differently: Take 20 mg by mouth daily as needed for heartburn. 09/26/21 05/06/23 Yes Barb Merino, MD  feeding supplement (ENSURE ENLIVE / ENSURE PLUS) LIQD Take 237 mLs by mouth 2 (two) times daily between meals. Patient taking differently: Take 237 mLs by mouth See admin instructions. Drink 237 ml by mouth 2-3 times a day between meals 02/04/21  Yes Dahal, Marlowe Aschoff, MD  fluticasone (FLONASE) 50 MCG/ACT  nasal spray Place 1 spray into both nostrils daily as needed for allergies. 01/15/22  Yes [provider]  gabapentin (NEURONTIN) 100 MG capsule Take 1 capsule (100 mg total) by mouth 3 (three) times daily. 10/27/21  Yes Lavina Hamman, MD  Naphazoline-Pheniramine (VISINE-A OP) Place 2 drops into both eyes daily as needed (itchy or irritated eyes).   Yes [provider]  Pancrelipase, Lip-Prot-Amyl, (PANCREAZE) 21000-54700 units CPEP Take 1 capsule by mouth with breakfast, with lunch, and with evening meal.   Yes [provider]  telmisartan (MICARDIS) 40 MG tablet Take 1 tablet (40 mg total) by mouth daily. 03/07/22 05/06/23 Yes Oswald Hillock, MD    Scheduled Meds:  famotidine  20 mg Oral BID   gabapentin  100 mg Oral TID  irbesartan  150 mg Oral Daily   sodium chloride flush  3 mL Intravenous Q12H   Infusions:  lactated ringers 100 mL/hr at 05/07/22 1619   PRN Meds: acetaminophen **OR** acetaminophen, hydrALAZINE, HYDROmorphone (DILAUDID) injection, labetalol, ondansetron **OR** ondansetron (ZOFRAN) IV, oxyCODONE, polyethylene glycol, prochlorperazine   Allergies as of 05/04/2022 - Review Complete 05/04/2022  Allergen Reaction Noted   Diclofenac Hives and Other (See Comments) 12/14/2013   Norvasc [amlodipine besylate] Other (See Comments) 08/15/2016   Omeprazole Other (See Comments) 08/15/2016   Hydrochlorothiazide  09/19/2021   Prednisone Hives and Other (See Comments) 12/14/2013    Family History  Problem Relation Age of Onset   Hypertension Other    Hypertension Mother    Hypertension Father    Kidney disease Father    Hypertension Sister    Diabetes Sister    Hypertension Brother    Pancreatic cancer Paternal Grandmother    Colon cancer Cousin    Stomach cancer Neg Hx    Esophageal cancer Neg Hx     Social History   Socioeconomic History   Marital status: Single    Spouse name: Not on file   Number of children: Not on file   Years of  education: Not on file   Highest education level: Not on file  Occupational History   Occupation: Engineer, building services  Tobacco Use   Smoking status: Never   Smokeless tobacco: Never  Vaping Use   Vaping Use: Never used  Substance and Sexual Activity   Alcohol use: Not Currently    Comment: remote h/o heavy use   Drug use: No   Sexual activity: Yes    Partners: Female    Birth control/protection: Condom  Other Topics Concern   Not on file  Social History Narrative   Not on file   Social Determinants of Health   Financial Resource Strain: Not on file  Food Insecurity: No Food Insecurity (01/24/2022)   Hunger Vital Sign    Worried About Running Out of Food in the Last Year: Never true    Ran Out of Food in the Last Year: Never true  Transportation Needs: No Transportation Needs (01/24/2022)   PRAPARE - Hydrologist (Medical): No    Lack of Transportation (Non-Medical): No  Physical Activity: Not on file  Stress: Not on file  Social Connections: Not on file  Intimate Partner Violence: Not At Risk (01/24/2022)   Humiliation, Afraid, Rape, and Kick questionnaire    Fear of Current or Ex-Partner: No    Emotionally Abused: No    Physically Abused: No    Sexually Abused: No    REVIEW OF SYSTEMS: Constitutional: Weakness improved over course of hospitalization ENT:  No nose bleeds Pulm: No shortness of breath or cough CV:  No palpitations, no LE edema.  GU:  No hematuria, no frequency.  Last week he drank "half a beer" because he could not pee and felt that beer would help him to urinate. GI: See HPI.  No dysphagia. Heme: Other than the blood in the vomit, no significant bleeding or bruising. Transfusions: None. Neuro:  No headaches, no peripheral tingling or numbness.  No syncope, no seizures Derm:  No itching, no rash or sores.  Endocrine:  No sweats or chills.  No polyuria or dysuria Immunization: Reviewed Travel: Not queried   PHYSICAL  EXAM: Vital signs in last 24 hours: Vitals:   05/08/22 1116 05/08/22 1350  BP: (!) 131/94 (!) 152/97  Pulse: 68  73  Resp: 17 18  Temp: 98.7 F (37.1 C) 98.5 F (36.9 C)  SpO2:  100%   Wt Readings from Last 3 Encounters:  05/04/22 74.8 kg  03/06/22 72.6 kg  01/23/22 73 kg    General: Patient looks well he is thin as always.  Comfortable, no distress. Head: No facial asymmetry or swelling.  No signs of head trauma. Eyes: No pallor or scleral icterus. Ears: No hearing deficit Nose: No congestion or discharge Mouth: Good dentition.  Tongue midline.  Oral mucosa moist, pink, clear. Neck: No mass, no JVD, no thyromegaly Lungs: No labored breathing or cough.  Lungs clear with good breath sounds bilaterally Heart: RRR.  No MRG.  S1, S2 present Abdomen: Soft.  Diffusely tender but no guarding or rebound.  No HSM, masses, fluid wave, hernias, bruits..   Rectal: Deferred Musc/Skeltl: No joint redness, swelling or gross deformity. Extremities: No CCE. Neurologic: Oriented x 3.  Moves all 4 limbs.  No tremor or weakness. Skin: No rash, no sores, no suspicious lesions Nodes: No cervical or inguinal adenopathy Psych: Calm, pleasant, cooperative.  Intake/Output from previous day: 01/22 0701 - 01/23 0700 In: 1611.5 [P.O.:480; I.V.:1131.5] Out: -  Intake/Output this shift: No intake/output data recorded.  LAB RESULTS: Recent Labs    05/06/22 0436 05/07/22 0352 05/08/22 0634  WBC 6.6 6.1 5.4  HGB 11.3* 11.9* 12.3*  HCT 32.6* 35.2* 37.4*  PLT 143* 153 164   BMET Lab Results  Component Value Date   NA 133 (L) 05/08/2022   NA 136 05/07/2022   NA 138 05/06/2022   K 3.8 05/08/2022   K 4.1 05/07/2022   K 3.3 (L) 05/06/2022   CL 98 05/08/2022   CL 100 05/07/2022   CL 103 05/06/2022   CO2 27 05/08/2022   CO2 28 05/07/2022   CO2 25 05/06/2022   GLUCOSE 112 (H) 05/08/2022   GLUCOSE 109 (H) 05/07/2022   GLUCOSE 123 (H) 05/06/2022   BUN 7 05/08/2022   BUN 5 (L) 05/07/2022    BUN 10 05/06/2022   CREATININE 1.43 (H) 05/08/2022   CREATININE 1.21 05/07/2022   CREATININE 1.15 05/06/2022   CALCIUM 9.1 05/08/2022   CALCIUM 9.0 05/07/2022   CALCIUM 8.8 (L) 05/06/2022   LFT Recent Labs    05/06/22 0436 05/07/22 0352 05/08/22 0634  PROT 5.7* 6.3* 6.4*  ALBUMIN 3.0* 3.3* 3.3*  AST '24 26 31  '$ ALT '15 19 21  '$ ALKPHOS 58 49 60  BILITOT 0.7 0.8 0.5   PT/INR Lab Results  Component Value Date   INR 1.0 10/31/2020   INR 1.1 11/02/2019   INR 1.11 10/19/2017   Hepatitis Panel No results for input(s): "HEPBSAG", "HCVAB", "HEPAIGM", "HEPBIGM" in the last 72 hours. C-Diff No components found for: "CDIFF" Lipase     Component Value Date/Time   LIPASE 25 05/07/2022 0352    Drugs of Abuse     Component Value Date/Time   LABOPIA POSITIVE (A) 10/25/2021 2100   COCAINSCRNUR NONE DETECTED 10/25/2021 2100   LABBENZ NONE DETECTED 10/25/2021 2100   AMPHETMU NONE DETECTED 10/25/2021 2100   THCU NONE DETECTED 10/25/2021 2100   LABBARB NONE DETECTED 10/25/2021 2100     RADIOLOGY STUDIES: No results found.    IMPRESSION:   Yet another 1 of multiple recurrences of acute on chronic pancreatitis.  Although patient denies significant alcohol intake, PEth levels current GGT within the last several months suggest more significant alcohol intake.  Limited hematemesis.  Rule out esophagitis, gastritis,  PUD.  Fortunately has excellent Hgb so amount of blood loss has not led to significant anemia.  Hyponatremia, noncritical.  Hyperglycemia   PLAN:     Add PPI.  In the past omeprazole discontinued as possibly causative for pancreatitis.  However since this is likely EtOH induced cause to his pancreatitis, discussed with Dr. Silverio Decamp and will initiate pantoprazole 40 mg daily   Azucena Freed  05/08/2022, 2:35 PM Phone [phone]   Attending physician's note  I have taken a history, reviewed the chart and examined the patient. I performed a substantive portion of  this encounter, including complete performance of at least one of the key components, in conjunction with the APP. I agree with the APP's note, impression and recommendations.   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 He has had extensive imaging and GI workup in the past  Complains of small volume hematemesis mostly regurgitation when he coughs, reports having episodes of nosebleed, most likely swallowed blood from self-limited nosebleed Hemoglobin is stable, no hemodynamic instability  Continue supportive care for acute on chronic pancreatitis Alcohol cessation  Start pantoprazole 40 mg daily  No plan for endoscopic evaluation at this point  GI will sign off, available if have any questions  The patient was provided an opportunity to ask questions and all were answered. The patient agreed with the plan and demonstrated an understanding of the instructions.  Damaris Hippo , MD [phone]

## 2022-05-08 NOTE — Progress Notes (Signed)
  Transition of Care [facility]) Screening Note   Patient Details  Name: Kyle Berry Date of Birth: January 04, 1971   Transition of Care [facility]) CM/SW Contact:    Sharin Mons, RN Phone Number: [phone] 05/08/2022, 2:28 PM   Presents with Acute on chronic pancreatitis. From home. Independent with ADL's, no DME usage. Works full-time, Engineer, building services. PCP: [facility] Transition of Care Department [facility]) has reviewed patient and no TOC needs have been identified at present time. We will continue to monitor patient advancement through interdisciplinary progression rounds. If new patient transition needs arise, please place a TOC consult.

## 2022-05-08 NOTE — Plan of Care (Signed)
  Problem: Activity: °Goal: Risk for activity intolerance will decrease °Outcome: Progressing °  °Problem: Nutrition: °Goal: Adequate nutrition will be maintained °Outcome: Progressing °  °Problem: Coping: °Goal: Level of anxiety will decrease °Outcome: Progressing °  °

## 2022-05-08 NOTE — Progress Notes (Signed)
Progress Note    Kyle Berry   RJJ:[id number]  DOB: 11/09/1970  DOA: 05/04/2022     3 PCP: [facility]  Initial CC: abd pain, N/V  Hospital Course: Kyle Berry is a 52 yo male with PMH chronic pancreatitis (h/o pancreas divisum and s/p ERCP with biliary duct sphincterotomy and pancreatic duct placement at [facility], removed on 7/20), solitary left kidney, HTN. He presented with worsening abdominal pain, N/V x 3 weeks, decreased urine output, and some recent blood mixed with vomitus.  CT A/P noting acute on chronic pancreatitis; no necrosis or fluid collections. Mild left nonobstructing hydronephrosis.  Lipase 45. Etoh level 86 (patient endorsed some drinking some beer to help urinate).  Creatinine normal, 1.22 and Hgb stable 12.8 g/dL.  Patient admitted for IVF, bowel rest, pain/nausea control, and monitoring for any further hematemesis.   Interval History:  Patient states he had episode of blood mixed with his vomit this morning and he is requesting evaluation by GI.  Abdominal pain appears stable but "not improved" since admission.  He did tolerate clear liquids yesterday and was okay advancing to full's this morning.  Assessment and Plan: * Acute on chronic pancreatitis ([place]) - last hospitalized 03/07/22 for prior flare -CT abdomen/pelvis shows "stable changes of acute on chronic pancreatitis".  No peripancreatic necrosis or fluid collections noted - Ethanol level 86.  Lipase 45 initially - TG normal, 75 -Symptomatically improving and tolerating clears yesterday however had episode of vomiting last night and this morning and states there was some blood tinge -He is amenable for full liquids today -Dilaudid and Oxy ordered however patient only using Dilaudid currently.  Will need to start decreasing  Hematemesis - vitals and Hgb stable - possible irritation from emesis vs MW tear vs other - follows with [facility] for this; recently he spoke with [facility] on  1/19 and was told to come to the ER if recurred - patient endorses scant episode of hematemesis this morning and is requesting GI evaluation due to this  Pancreatic insufficiency - will restart pancreatic enzymes once diet resumes  Essential hypertension - labetalol or hydralazine PRN -Continue ARB.  Tolerating  Esophageal reflux - will resume home meds when able   Old records reviewed in assessment of this patient  Antimicrobials:   DVT prophylaxis:  PADUA less than 4   Code Status:   Code Status: Full Code  Mobility Assessment (last 72 hours)     Mobility Assessment     Row Name 05/08/22 0160 05/07/22 2057 05/06/22 1100       Does patient have an order for bedrest or is patient medically unstable No - Continue assessment No - Continue assessment No - Continue assessment     What is the highest level of mobility based on the progressive mobility assessment? Level 6 (Walks independently in room and hall) - Balance while walking in room without assist - Complete Level 6 (Walks independently in room and hall) - Balance while walking in room without assist - Complete Level 6 (Walks independently in room and hall) - Balance while walking in room without assist - Complete              Barriers to discharge:  Disposition Plan: Home 2 to 3 days Status is: Inpatient  Objective: Blood pressure (!) 131/94, pulse 68, temperature 98.7 F (37.1 C), temperature source Oral, resp. rate 17, height '6\' 1"'$  (1.854 m), weight 74.8 kg, SpO2 95 %.  Examination:  Physical Exam  Constitutional:      General: He is not in acute distress.    Appearance: Normal appearance.  HENT:     Head: Normocephalic and atraumatic.     Mouth/Throat:     Mouth: Mucous membranes are moist.  Eyes:     Extraocular Movements: Extraocular movements intact.  Pulmonary:     Effort: Pulmonary effort is normal.     Breath sounds: Normal breath sounds.  Abdominal:     General: Bowel sounds are normal.  There is no distension.     Palpations: Abdomen is soft.     Tenderness: There is abdominal tenderness.     Comments: LUQ TTP, no R/G  Musculoskeletal:        General: No swelling. Normal range of motion.     Cervical back: Normal range of motion and neck supple.  Skin:    General: Skin is warm and dry.  Neurological:     General: No focal deficit present.     Mental Status: He is alert.  Psychiatric:        Mood and Affect: Mood normal.      Consultants:  GI  Procedures:    Data Reviewed: Results for orders placed or performed during the hospital encounter of 05/04/22 (from the past 24 hour(s))  CBC with Differential/Platelet     Status: Abnormal   Collection Time: 05/08/22  6:34 AM  Result Value Ref Range   WBC 5.4 4.0 - 10.5 K/uL   RBC 3.68 (L) 4.22 - 5.81 MIL/uL   Hemoglobin 12.3 (L) 13.0 - 17.0 g/dL   HCT 37.4 (L) 39.0 - 52.0 %   MCV 101.6 (H) 80.0 - 100.0 fL   MCH 33.4 26.0 - 34.0 pg   MCHC 32.9 30.0 - 36.0 g/dL   RDW 15.7 (H) 11.5 - 15.5 %   Platelets 164 150 - 400 K/uL   nRBC 0.0 0.0 - 0.2 %   Neutrophils Relative % 59 %   Neutro Abs 3.2 1.7 - 7.7 K/uL   Lymphocytes Relative 31 %   Lymphs Abs 1.7 0.7 - 4.0 K/uL   Monocytes Relative 8 %   Monocytes Absolute 0.4 0.1 - 1.0 K/uL   Eosinophils Relative 2 %   Eosinophils Absolute 0.1 0.0 - 0.5 K/uL   Basophils Relative 0 %   Basophils Absolute 0.0 0.0 - 0.1 K/uL   Immature Granulocytes 0 %   Abs Immature Granulocytes 0.01 0.00 - 0.07 K/uL  Comprehensive metabolic panel     Status: Abnormal   Collection Time: 05/08/22  6:34 AM  Result Value Ref Range   Sodium 133 (L) 135 - 145 mmol/L   Potassium 3.8 3.5 - 5.1 mmol/L   Chloride 98 98 - 111 mmol/L   CO2 27 22 - 32 mmol/L   Glucose, Bld 112 (H) 70 - 99 mg/dL   BUN 7 6 - 20 mg/dL   Creatinine, Ser 1.43 (H) 0.61 - 1.24 mg/dL   Calcium 9.1 8.9 - 10.3 mg/dL   Total Protein 6.4 (L) 6.5 - 8.1 g/dL   Albumin 3.3 (L) 3.5 - 5.0 g/dL   AST 31 15 - 41 U/L   ALT 21 0  - 44 U/L   Alkaline Phosphatase 60 38 - 126 U/L   Total Bilirubin 0.5 0.3 - 1.2 mg/dL   GFR, Estimated 59 (L) >60 mL/min   Anion gap 8 5 - 15  Magnesium     Status: None   Collection Time: 05/08/22  6:34 AM  Result Value Ref Range   Magnesium 1.7 1.7 - 2.4 mg/dL    I have reviewed pertinent nursing notes, vitals, labs, and images as necessary. I have ordered labwork to follow up on as indicated.  I have reviewed the last notes from staff over past 24 hours. I have discussed patient's care plan and test results with nursing staff, CM/SW, and other staff as appropriate.  Time spent: Greater than 50% of the 55 minute visit was spent in counseling/coordination of care for the patient as laid out in the A&P.   LOS: 3 days   Dwyane Dee, MD [facility] 05/08/2022, 12:15 PM

## 2022-05-09 DIAGNOSIS — K861 Other chronic pancreatitis: Secondary | ICD-10-CM | POA: Diagnosis not present

## 2022-05-09 DIAGNOSIS — K859 Acute pancreatitis without necrosis or infection, unspecified: Secondary | ICD-10-CM | POA: Diagnosis not present

## 2022-05-09 DIAGNOSIS — K8689 Other specified diseases of pancreas: Secondary | ICD-10-CM | POA: Diagnosis not present

## 2022-05-09 DIAGNOSIS — I10 Essential (primary) hypertension: Secondary | ICD-10-CM | POA: Diagnosis not present

## 2022-05-09 LAB — CBC WITH DIFFERENTIAL/PLATELET
Abs Immature Granulocytes: 0.02 10*3/uL (ref 0.00–0.07)
Basophils Absolute: 0 10*3/uL (ref 0.0–0.1)
Basophils Relative: 0 %
Eosinophils Absolute: 0.1 10*3/uL (ref 0.0–0.5)
Eosinophils Relative: 1 %
HCT: 36.2 % — ABNORMAL LOW (ref 39.0–52.0)
Hemoglobin: 11.8 g/dL — ABNORMAL LOW (ref 13.0–17.0)
Immature Granulocytes: 0 %
Lymphocytes Relative: 29 %
Lymphs Abs: 1.7 10*3/uL (ref 0.7–4.0)
MCH: 32.9 pg (ref 26.0–34.0)
MCHC: 32.6 g/dL (ref 30.0–36.0)
MCV: 100.8 fL — ABNORMAL HIGH (ref 80.0–100.0)
Monocytes Absolute: 0.5 10*3/uL (ref 0.1–1.0)
Monocytes Relative: 8 %
Neutro Abs: 3.6 10*3/uL (ref 1.7–7.7)
Neutrophils Relative %: 62 %
Platelets: 175 10*3/uL (ref 150–400)
RBC: 3.59 MIL/uL — ABNORMAL LOW (ref 4.22–5.81)
RDW: 15.5 % (ref 11.5–15.5)
WBC: 5.8 10*3/uL (ref 4.0–10.5)
nRBC: 0 % (ref 0.0–0.2)

## 2022-05-09 LAB — COMPREHENSIVE METABOLIC PANEL
ALT: 23 U/L (ref 0–44)
AST: 30 U/L (ref 15–41)
Albumin: 3.4 g/dL — ABNORMAL LOW (ref 3.5–5.0)
Alkaline Phosphatase: 56 U/L (ref 38–126)
Anion gap: 7 (ref 5–15)
BUN: <5 mg/dL — ABNORMAL LOW (ref 6–20)
CO2: 30 mmol/L (ref 22–32)
Calcium: 9.6 mg/dL (ref 8.9–10.3)
Chloride: 99 mmol/L (ref 98–111)
Creatinine, Ser: 1.22 mg/dL (ref 0.61–1.24)
GFR, Estimated: >60 mL/min (ref >60)
Glucose, Bld: 124 mg/dL — ABNORMAL HIGH (ref 70–99)
Potassium: 4.1 mmol/L (ref 3.5–5.1)
Sodium: 136 mmol/L (ref 135–145)
Total Bilirubin: 0.5 mg/dL (ref 0.3–1.2)
Total Protein: 6.5 g/dL (ref 6.5–8.1)

## 2022-05-09 LAB — MAGNESIUM: Magnesium: 1.7 mg/dL (ref 1.7–2.4)

## 2022-05-09 MED ORDER — PANCRELIPASE (LIP-PROT-AMYL) 12000-38000 UNITS PO CPEP
24000.0000 [IU] | ORAL_CAPSULE | Freq: Three times a day (TID) | ORAL | Status: DC
  Administered 2022-05-09 – 2022-05-11 (×5): 24000 [IU] via ORAL
  Filled 2022-05-09 (×5): qty 2

## 2022-05-09 NOTE — Progress Notes (Signed)
Administered-dose RRN labetalol for SBP-180

## 2022-05-09 NOTE — Progress Notes (Signed)
[facility]                                                                               Kyle Berry, is a 52 y.o. male, DOB - [birth year], [id number]:[id number] Admit date - 05/04/2022    Outpatient Primary MD for the patient is [facility]  LOS - 4  days    Brief summary   Kyle Berry is a 52 yo male with PMH chronic pancreatitis (h/o pancreas divisum and s/p ERCP with biliary duct sphincterotomy and pancreatic duct placement at [facility], removed on 7/20), solitary left kidney, HTN. He presented with worsening abdominal pain, N/V x 3 weeks, decreased urine output, and some recent blood mixed with vomitus.  CT A/P noting acute on chronic pancreatitis; no necrosis or fluid collections. Mild left nonobstructing hydronephrosis.  Lipase 45. Etoh level 86 (patient endorsed some drinking some beer to help urinate).  Creatinine normal, 1.22 and Hgb stable 12.8 g/dL.  Patient admitted for IVF, bowel rest, pain/nausea control, and monitoring for any further hematemesis.    Assessment & Plan    Assessment and Plan: * Acute on chronic pancreatitis ([place]) - last hospitalized 03/07/22 for prior flare -CT abdomen/pelvis shows acute on chronic pancreatitis. No pseudocysts.  - started on clears, advance as tolerated.  - hydrate and pain control.   Hematemesis - vitals and Hgb stable - possible irritation from emesis vs MW tear vs other - GI consulted, recommended starting on PPI.  - no more emesis so far.    Pancreatic insufficiency Restarted the Creon.   Essential hypertension BP parameters are optimal.   Esophageal reflux - will resume home meds when able    Estimated body mass index is 21.77 kg/m as calculated from the following:   Height as of this encounter: '6\' 1"'$  (1.854 m).   Weight as of this encounter: 74.8 kg.  Code Status:  DVT Prophylaxis:     Level of Care: Level of care: Med-Surg Family Communication: none at bedside.    Disposition Plan:     Remains inpatient appropriate: d/c home when able to tolerate soft diet.   Procedures:  None   Consultants:   GI.   Antimicrobials:   Anti-infectives (From admission, onward)    None        Medications  Scheduled Meds:  famotidine  20 mg Oral BID   gabapentin  100 mg Oral TID   irbesartan  150 mg Oral Daily   pantoprazole  40 mg Oral Q0600   sodium chloride flush  3 mL Intravenous Q12H   Continuous Infusions:  lactated ringers 100 mL/hr at 05/09/22 0720   PRN Meds:.acetaminophen **OR** acetaminophen, hydrALAZINE, HYDROmorphone (DILAUDID) injection, labetalol, ondansetron **OR** ondansetron (ZOFRAN) IV, oxyCODONE, polyethylene glycol, prochlorperazine    Subjective:   Kyle Berry was seen and examined today.  Pain improving.   Objective:   Vitals:   05/08/22 2100 05/09/22 0115 05/09/22 0533 05/09/22 0718  BP: (!) 180/93 (!) 170/94 (!) 149/90 (!) 142/94  Pulse: 65 62 60 62  Resp: 18   16  Temp:    97.7 F (36.5 C)  TempSrc:    Oral  SpO2:  99%   99%  Weight:      Height:        Intake/Output Summary (Last 24 hours) at 05/09/2022 1240 Last data filed at 05/09/2022 1100 Gross per 24 hour  Intake 240 ml  Output --  Net 240 ml   Filed Weights   05/04/22 1823  Weight: 74.8 kg     Exam General exam: Appears calm and comfortable  Respiratory system: Clear to auscultation. Respiratory effort normal. Cardiovascular system: S1 & S2 heard, RRR. No JVD,  Gastrointestinal system: Abdomen is nondistended, soft and nontender. No organomegaly or masses felt.  Central nervous system: Alert and oriented. No focal neurological deficits. Extremities: Symmetric 5 x 5 power. Skin: No rashes, Psychiatry:  Mood & affect appropriate.    Data Reviewed:  I have personally reviewed following labs and imaging studies   CBC Lab Results  Component Value Date   WBC 5.8 05/09/2022   RBC 3.59 (L) 05/09/2022   HGB 11.8 (L) 05/09/2022   HCT  36.2 (L) 05/09/2022   MCV 100.8 (H) 05/09/2022   MCH 32.9 05/09/2022   PLT 175 05/09/2022   MCHC 32.6 05/09/2022   RDW 15.5 05/09/2022   LYMPHSABS 1.7 05/09/2022   MONOABS 0.5 05/09/2022   EOSABS 0.1 05/09/2022   BASOSABS 0.0 29/24/4628     Last metabolic panel Lab Results  Component Value Date   NA 136 05/09/2022   K 4.1 05/09/2022   CL 99 05/09/2022   CO2 30 05/09/2022   BUN <5 (L) 05/09/2022   CREATININE 1.22 05/09/2022   GLUCOSE 124 (H) 05/09/2022   GFRNONAA >60 05/09/2022   GFRAA >60 11/06/2019   CALCIUM 9.6 05/09/2022   PHOS 3.9 09/25/2021   PROT 6.5 05/09/2022   ALBUMIN 3.4 (L) 05/09/2022   BILITOT 0.5 05/09/2022   ALKPHOS 56 05/09/2022   AST 30 05/09/2022   ALT 23 05/09/2022   ANIONGAP 7 05/09/2022    CBG (last 3)  No results for input(s): "GLUCAP" in the last 72 hours.    Coagulation Profile: No results for input(s): "INR", "PROTIME" in the last 168 hours.   Radiology Studies: No results found.     Hosie Poisson M.D. [facility] 05/09/2022, 12:40 PM  Available via Epic secure chat 7am-7pm After 7 pm, please refer to night coverage provider listed on amion.

## 2022-05-10 DIAGNOSIS — K8689 Other specified diseases of pancreas: Secondary | ICD-10-CM | POA: Diagnosis not present

## 2022-05-10 DIAGNOSIS — K859 Acute pancreatitis without necrosis or infection, unspecified: Secondary | ICD-10-CM | POA: Diagnosis not present

## 2022-05-10 DIAGNOSIS — I10 Essential (primary) hypertension: Secondary | ICD-10-CM | POA: Diagnosis not present

## 2022-05-10 DIAGNOSIS — K861 Other chronic pancreatitis: Secondary | ICD-10-CM | POA: Diagnosis not present

## 2022-05-10 LAB — COMPREHENSIVE METABOLIC PANEL
ALT: 23 U/L (ref 0–44)
AST: 29 U/L (ref 15–41)
Albumin: 3.5 g/dL (ref 3.5–5.0)
Alkaline Phosphatase: 56 U/L (ref 38–126)
Anion gap: 7 (ref 5–15)
BUN: 7 mg/dL (ref 6–20)
CO2: 28 mmol/L (ref 22–32)
Calcium: 9.2 mg/dL (ref 8.9–10.3)
Chloride: 100 mmol/L (ref 98–111)
Creatinine, Ser: 1.56 mg/dL — ABNORMAL HIGH (ref 0.61–1.24)
GFR, Estimated: 53 mL/min — ABNORMAL LOW (ref >60)
Glucose, Bld: 119 mg/dL — ABNORMAL HIGH (ref 70–99)
Potassium: 4.1 mmol/L (ref 3.5–5.1)
Sodium: 135 mmol/L (ref 135–145)
Total Bilirubin: 0.5 mg/dL (ref 0.3–1.2)
Total Protein: 6.6 g/dL (ref 6.5–8.1)

## 2022-05-10 LAB — CBC WITH DIFFERENTIAL/PLATELET
Abs Immature Granulocytes: 0.01 10*3/uL (ref 0.00–0.07)
Basophils Absolute: 0 10*3/uL (ref 0.0–0.1)
Basophils Relative: 0 %
Eosinophils Absolute: 0.1 10*3/uL (ref 0.0–0.5)
Eosinophils Relative: 1 %
HCT: 35.8 % — ABNORMAL LOW (ref 39.0–52.0)
Hemoglobin: 11.8 g/dL — ABNORMAL LOW (ref 13.0–17.0)
Immature Granulocytes: 0 %
Lymphocytes Relative: 33 %
Lymphs Abs: 1.7 10*3/uL (ref 0.7–4.0)
MCH: 33.3 pg (ref 26.0–34.0)
MCHC: 33 g/dL (ref 30.0–36.0)
MCV: 101.1 fL — ABNORMAL HIGH (ref 80.0–100.0)
Monocytes Absolute: 0.5 10*3/uL (ref 0.1–1.0)
Monocytes Relative: 9 %
Neutro Abs: 2.9 10*3/uL (ref 1.7–7.7)
Neutrophils Relative %: 57 %
Platelets: 177 10*3/uL (ref 150–400)
RBC: 3.54 MIL/uL — ABNORMAL LOW (ref 4.22–5.81)
RDW: 15.6 % — ABNORMAL HIGH (ref 11.5–15.5)
WBC: 5.1 10*3/uL (ref 4.0–10.5)
nRBC: 0 % (ref 0.0–0.2)

## 2022-05-10 LAB — MAGNESIUM: Magnesium: 1.9 mg/dL (ref 1.7–2.4)

## 2022-05-10 NOTE — Progress Notes (Signed)
PT Cancellation Note  Patient Details Name: Kyle Berry MRN: [id number] DOB: Apr 20, 1970   Cancelled Treatment:    Reason Eval/Treat Not Completed: PT screened, no needs identified, will sign off. Pt is independently walking on the unit and states that he has no needs at this time. Please re-consult if needs arise.  Tomma Rakers, DPT, [place]  Acute Rehabilitation Services Office: [phone] (Secure chat preferred)    Ander Purpura 05/10/2022, 3:35 PM

## 2022-05-10 NOTE — Progress Notes (Addendum)
[facility]                                                                               Kyle Berry, is a 52 y.o. male, DOB - 1970-10-26, [id number] Admit date - 05/04/2022    Outpatient Primary MD for the patient is [facility]  LOS - 5  days    Brief summary   Kyle Berry is a 52 yo male with PMH chronic pancreatitis (h/o pancreas divisum and s/p ERCP with biliary duct sphincterotomy and pancreatic duct placement at [facility], removed on 7/20), solitary left kidney, HTN. He presented with worsening abdominal pain, N/V x 3 weeks, decreased urine output, and some recent blood mixed with vomitus.  CT A/P noting acute on chronic pancreatitis; no necrosis or fluid collections. Mild left nonobstructing hydronephrosis.  Lipase 45. Etoh level 86 (patient endorsed some drinking some beer to help urinate).  Creatinine normal, 1.22 and Hgb stable 12.8 g/dL.  Patient admitted for IVF, bowel rest, pain/nausea control, and monitoring for any further hematemesis.    Assessment & Plan    Assessment and Plan: * Acute on chronic pancreatitis ([place]) - last hospitalized 03/07/22 for prior flare -CT abdomen/pelvis shows acute on chronic pancreatitis. No pseudocysts.  - started on clears, advance as tolerated. Currently on soft diet, one episode of vomiting. Continue to monitor.  - hydrate and pain control.   Hematemesis - vitals and Hgb stable - possible irritation from emesis vs MW tear vs other - GI consulted, recommended starting on PPI.     Pancreatic insufficiency Restarted the Creon.   Essential hypertension BP parameters are well controlled.   Esophageal reflux -on PPI.   AKI;  Suspect from poor oral intake.  Continue with IV fluids and recheck BMP in am.     Estimated body mass index is 21.77 kg/m as calculated from the following:   Height as of this encounter: '6\' 1"'$  (1.854 m).   Weight as of this encounter: 74.8 kg.  Code  Status:  DVT Prophylaxis:     Level of Care: Level of care: Med-Surg Family Communication: none at bedside.   Disposition Plan:     Remains inpatient appropriate: d/c home when able to tolerate soft diet.   Procedures:  None   Consultants:   GI.   Antimicrobials:   Anti-infectives (From admission, onward)    None        Medications  Scheduled Meds:  famotidine  20 mg Oral BID   gabapentin  100 mg Oral TID   lipase/protease/amylase  24,000 Units Oral TID WC   pantoprazole  40 mg Oral Q0600   sodium chloride flush  3 mL Intravenous Q12H   Continuous Infusions:  lactated ringers 100 mL/hr at 05/10/22 1505   PRN Meds:.acetaminophen **OR** acetaminophen, hydrALAZINE, HYDROmorphone (DILAUDID) injection, labetalol, ondansetron **OR** ondansetron (ZOFRAN) IV, oxyCODONE, polyethylene glycol, prochlorperazine    Subjective:   Kyle Berry was seen and examined today.  Pain improving. One episode of emesis.   Objective:   Vitals:   05/09/22 0718 05/09/22 1428 05/09/22 1955 05/10/22 0818  BP: (!) 142/94 (!) 154/91 (!) 149/83 (!) 148/90  Pulse: 62  67 83 68  Resp: '16 18 18 16  '$ Temp: 97.7 F (36.5 C) 97.7 F (36.5 C) 98.2 F (36.8 C) 98.3 F (36.8 C)  TempSrc: Oral Oral Oral Oral  SpO2: 99% 99% 98% 100%  Weight:      Height:       No intake or output data in the 24 hours ending 05/10/22 1811  Filed Weights   05/04/22 1823  Weight: 74.8 kg     Exam General exam: Appears calm and comfortable  Respiratory system: Clear to auscultation. Respiratory effort normal. Cardiovascular system: S1 & S2 heard, RRR. No JVD, murmurs,  Gastrointestinal system: Abdomen is soft,  mild gen tenderness.  Central nervous system: Alert and oriented. No focal neurological deficits. Extremities: Symmetric 5 x 5 power. Skin: No rashes,  Psychiatry:  Mood & affect appropriate.    Data Reviewed:  I have personally reviewed following labs and imaging studies   CBC Lab  Results  Component Value Date   WBC 5.1 05/10/2022   RBC 3.54 (L) 05/10/2022   HGB 11.8 (L) 05/10/2022   HCT 35.8 (L) 05/10/2022   MCV 101.1 (H) 05/10/2022   MCH 33.3 05/10/2022   PLT 177 05/10/2022   MCHC 33.0 05/10/2022   RDW 15.6 (H) 05/10/2022   LYMPHSABS 1.7 05/10/2022   MONOABS 0.5 05/10/2022   EOSABS 0.1 05/10/2022   BASOSABS 0.0 28/63/8177     Last metabolic panel Lab Results  Component Value Date   NA 135 05/10/2022   K 4.1 05/10/2022   CL 100 05/10/2022   CO2 28 05/10/2022   BUN 7 05/10/2022   CREATININE 1.56 (H) 05/10/2022   GLUCOSE 119 (H) 05/10/2022   GFRNONAA 53 (L) 05/10/2022   GFRAA >60 11/06/2019   CALCIUM 9.2 05/10/2022   PHOS 3.9 09/25/2021   PROT 6.6 05/10/2022   ALBUMIN 3.5 05/10/2022   BILITOT 0.5 05/10/2022   ALKPHOS 56 05/10/2022   AST 29 05/10/2022   ALT 23 05/10/2022   ANIONGAP 7 05/10/2022    CBG (last 3)  No results for input(s): "GLUCAP" in the last 72 hours.    Coagulation Profile: No results for input(s): "INR", "PROTIME" in the last 168 hours.   Radiology Studies: No results found.     Hosie Poisson M.D. [facility] 05/10/2022, 6:11 PM  Available via Epic secure chat 7am-7pm After 7 pm, please refer to night coverage provider listed on amion.

## 2022-05-11 DIAGNOSIS — K219 Gastro-esophageal reflux disease without esophagitis: Secondary | ICD-10-CM | POA: Diagnosis not present

## 2022-05-11 DIAGNOSIS — K8689 Other specified diseases of pancreas: Secondary | ICD-10-CM | POA: Diagnosis not present

## 2022-05-11 DIAGNOSIS — I10 Essential (primary) hypertension: Secondary | ICD-10-CM | POA: Diagnosis not present

## 2022-05-11 DIAGNOSIS — K859 Acute pancreatitis without necrosis or infection, unspecified: Secondary | ICD-10-CM | POA: Diagnosis not present

## 2022-05-11 LAB — BASIC METABOLIC PANEL
Anion gap: 7 (ref 5–15)
BUN: 10 mg/dL (ref 6–20)
CO2: 26 mmol/L (ref 22–32)
Calcium: 9 mg/dL (ref 8.9–10.3)
Chloride: 101 mmol/L (ref 98–111)
Creatinine, Ser: 1.18 mg/dL (ref 0.61–1.24)
GFR, Estimated: >60 mL/min (ref >60)
Glucose, Bld: 119 mg/dL — ABNORMAL HIGH (ref 70–99)
Potassium: 3.9 mmol/L (ref 3.5–5.1)
Sodium: 134 mmol/L — ABNORMAL LOW (ref 135–145)

## 2022-05-11 MED ORDER — ONDANSETRON HCL 4 MG PO TABS: 4.0000 mg | ORAL_TABLET | Freq: Four times a day (QID) | ORAL | 0 refills | Status: DC | PRN

## 2022-05-11 MED ORDER — OXYCODONE HCL 5 MG PO TABS: 5.0000 mg | ORAL_TABLET | Freq: Three times a day (TID) | ORAL | 0 refills | Status: AC | PRN

## 2022-05-11 MED ORDER — PANTOPRAZOLE SODIUM 40 MG PO TBEC: 40.0000 mg | DELAYED_RELEASE_TABLET | Freq: Every day | ORAL | 2 refills | Status: DC

## 2022-05-11 MED ORDER — POLYETHYLENE GLYCOL 3350 17 G PO PACK: 17.0000 g | PACK | Freq: Every day | ORAL | 0 refills | Status: DC | PRN

## 2022-05-11 NOTE — Discharge Summary (Signed)
Physician Discharge Summary   Patient: Kyle Berry MRN: [id number] DOB: [birth year]  Admit date:     05/04/2022  Discharge date: 05/11/22  Discharge Physician: Hosie Poisson   PCP: [facility]   Recommendations at discharge:  Please follow up with PCp in one week.  Please follow up with GI at [facility]  Discharge Diagnoses: Principal Problem:   Acute on chronic pancreatitis [facility]) Active Problems:   Hematemesis   Essential hypertension   Pancreatic insufficiency   Esophageal reflux   Pancreatic divisum    Hospital Course: Kyle Berry is a 52 yo male with PMH chronic pancreatitis (h/o pancreas divisum and s/p ERCP with biliary duct sphincterotomy and pancreatic duct placement at [facility], removed on 7/20), solitary left kidney, HTN. He presented with worsening abdominal pain, N/V x 3 weeks, decreased urine output, and some recent blood mixed with vomitus.  CT A/P noting acute on chronic pancreatitis; no necrosis or fluid collections. Mild left nonobstructing hydronephrosis.  Lipase 45. Etoh level 86 (patient endorsed some drinking some beer to help urinate).  Creatinine normal, 1.22 and Hgb stable 12.8 g/dL.  Patient admitted for IVF, bowel rest, pain/nausea control, and monitoring for any further hematemesis.   Assessment and Plan:   Acute on chronic pancreatitis ([place]) - last hospitalized 03/07/22 for prior flare -CT abdomen/pelvis shows acute on chronic pancreatitis. No pseudocysts.  - started on clears, advance as tolerated. Currently on soft diet,able to tolerate.  - hydrate and pain control.    Hematemesis - vitals and Hgb stable - possible irritation from emesis vs MW tear vs other - GI consulted, recommended starting on PPI.        Pancreatic insufficiency Restarted the Creon.    Essential hypertension BP parameters are well controlled.    Esophageal reflux -on PPI.    AKI;  Suspect from poor oral intake.  Continue with IV fluids  and recheck BMP in am.      Consultants: GI  Procedures performed: none.   Disposition: Home Diet recommendation:  Regular diet DISCHARGE MEDICATION: Allergies as of 05/11/2022       Reactions   Diclofenac Hives, Other (See Comments)   Fluid buildup in chest   Norvasc [amlodipine Besylate] Other (See Comments)   Fluid buildup in chest   Omeprazole Other (See Comments)   MD stopped due to pancreatitis   Hydrochlorothiazide    07/31/2016 -  07/20/2016 -  05/28/2016 -   Other Reaction(s) from [place]: itching   Prednisone Hives, Other (See Comments)   Mood swings        Medication List     TAKE these medications    albuterol 108 (90 Base) MCG/ACT inhaler Commonly known as: VENTOLIN HFA Inhale 2 puffs into the lungs every 6 (six) hours as needed for wheezing or shortness of breath.   famotidine 20 MG tablet Commonly known as: PEPCID Take 1 tablet (20 mg total) by mouth 2 (two) times daily. What changed:  when to take this reasons to take this   feeding supplement Liqd Take 237 mLs by mouth 2 (two) times daily between meals. What changed:  when to take this additional instructions   fluticasone 50 MCG/ACT nasal spray Commonly known as: FLONASE Place 1 spray into both nostrils daily as needed for allergies.   gabapentin 100 MG capsule Commonly known as: NEURONTIN Take 1 capsule (100 mg total) by mouth 3 (three) times daily.   ondansetron 4 MG tablet Commonly known as: ZOFRAN Take  1 tablet (4 mg total) by mouth every 6 (six) hours as needed for nausea.   oxyCODONE 5 MG immediate release tablet Commonly known as: Oxy IR/ROXICODONE Take 1 tablet (5 mg total) by mouth every 8 (eight) hours as needed for up to 3 days for moderate pain.   Pancreaze 21000-54700 units Cpep Generic drug: Pancrelipase (Lip-Prot-Amyl) Take 1 capsule by mouth with breakfast, with lunch, and with evening meal.   pantoprazole 40 MG tablet Commonly known as: PROTONIX Take 1  tablet (40 mg total) by mouth daily at 6 (six) AM. Start taking on: May 12, 2022   polyethylene glycol 17 g packet Commonly known as: MIRALAX / GLYCOLAX Take 17 g by mouth daily as needed for mild constipation.   telmisartan 40 MG tablet Commonly known as: MICARDIS Take 1 tablet (40 mg total) by mouth daily.   VISINE-A OP Place 2 drops into both eyes daily as needed (itchy or irritated eyes).        Follow-up Information     [facility] Follow up.   Contact information: [place] [address] [phone]                Discharge Exam: Danley Danker Weights   05/04/22 1823  Weight: 74.8 kg   General exam: Appears calm and comfortable  Respiratory system: Clear to auscultation. Respiratory effort normal. Cardiovascular system: S1 & S2 heard, RRR. No JVD, murmurs, Gastrointestinal system: Abdomen is nondistended, soft and nontender.  Central nervous system: Alert and oriented. No focal neurological deficits. Extremities: Symmetric 5 x 5 power. Skin: No rashes, Psychiatry:Mood & affect appropriate.    Condition at discharge: fair  The results of significant diagnostics from this hospitalization (including imaging, microbiology, ancillary and laboratory) are listed below for reference.   Imaging Studies: CT Abdomen Pelvis W Contrast  Result Date: 05/04/2022 CLINICAL DATA:  Pancreatitis, acute, severe. EXAM: CT ABDOMEN AND PELVIS WITH CONTRAST TECHNIQUE: Multidetector CT imaging of the abdomen and pelvis was performed using the standard protocol following bolus administration of intravenous contrast. RADIATION DOSE REDUCTION: This exam was performed according to the departmental dose-optimization program which includes automated exposure control, adjustment of the mA and/or kV according to patient size and/or use of iterative reconstruction technique. CONTRAST:  29m OMNIPAQUE IOHEXOL 350 MG/ML SOLN COMPARISON:  03/05/2022  FINDINGS: Lower chest: No acute abnormality. Hepatobiliary: No focal liver abnormality is seen. Status post cholecystectomy. No biliary dilatation. Pancreas: Punctate calcification within the pancreatic head and trace peripancreatic infiltrative change within this region is again identified and appears stable since prior examination in keeping with changes of acute on chronic pancreatitis. The pancreatic duct is not dilated. No peripancreatic fluid collections are identified. Normal enhancement of the pancreatic parenchyma. Spleen: Unremarkable Adrenals/Urinary Tract: The adrenal glands are unremarkable. Marked atrophy of the right kidney again identified. Compensatory hypertrophy of the left kidney again noted. Stable nonobstructing calculi within the lower pole left kidney measuring up to 6 mm since remote prior examination of 01/23/2022. No hydronephrosis. No ureteral calculi. No perinephric inflammatory stranding or fluid collections are identified. The bladder is unremarkable. Stomach/Bowel: Mild sigmoid diverticulosis. The stomach, small bowel, and large bowel are otherwise unremarkable. No evidence of obstruction or focal inflammation. Appendix normal. No free intraperitoneal gas or fluid. Vascular/Lymphatic: No significant vascular findings are present. No enlarged abdominal or pelvic lymph nodes. Reproductive: Prostate is unremarkable. Other: No abdominal wall hernia or abnormality. No abdominopelvic ascites. Musculoskeletal: Degenerative changes noted L5-S1. No acute bone  abnormality. No lytic or blastic bone lesion. IMPRESSION: 1. Stable changes of acute on chronic pancreatitis. No pancreatic or peripancreatic necrosis. No peripancreatic fluid collections identified. 2. Stable marked atrophy of the right kidney with compensatory hypertrophy of the left kidney. Mild left nonobstructing nephrolithiasis. 3. Mild sigmoid diverticulosis. Electronically Signed   By: Fidela Salisbury M.D.   On: 05/04/2022 22:33     Microbiology: Results for orders placed or performed during the hospital encounter of 04/21/21  Urine Culture     Status: None   Collection Time: 04/21/21  6:23 AM   Specimen: Urine, Clean Catch  Result Value Ref Range Status   Specimen Description URINE, CLEAN CATCH  Final   Special Requests NONE  Final   Culture   Final    NO GROWTH Performed at [facility], [place] [address] [place], [place] [address]    Report Status 04/23/2021 FINAL  Final  Resp Panel by RT-PCR (Flu A&B, Covid) Nasopharyngeal Swab     Status: None   Collection Time: 04/21/21  5:21 PM   Specimen: Nasopharyngeal Swab; Nasopharyngeal(NP) swabs in vial transport medium  Result Value Ref Range Status   SARS Coronavirus 2 by RT PCR NEGATIVE NEGATIVE Final    Comment: (NOTE) SARS-CoV-2 target nucleic acids are NOT DETECTED.  The SARS-CoV-2 RNA is generally detectable in upper respiratory specimens during the acute phase of infection. The lowest concentration of SARS-CoV-2 viral copies this assay can detect is 138 copies/mL. A negative result does not preclude SARS-Cov-2 infection and should not be used as the sole basis for treatment or other patient management decisions. A negative result may occur with  improper specimen collection/handling, submission of specimen other than nasopharyngeal swab, presence of viral mutation(s) within the areas targeted by this assay, and inadequate number of viral copies(<138 copies/mL). A negative result must be combined with clinical observations, patient history, and epidemiological information. The expected result is Negative.  Fact Sheet for Patients:  EntrepreneurPulse.com.au  Fact Sheet for Healthcare Providers:  IncredibleEmployment.be  This test is no t yet approved or cleared by the [place] FDA and  has been authorized for detection and/or diagnosis of SARS-CoV-2 by FDA under an Emergency Use Authorization (EUA). This  EUA will remain  in effect (meaning this test can be used) for the duration of the COVID-19 declaration under Section 564(b)(1) of the Act, 21 U.S.C.section 360bbb-3(b)(1), unless the authorization is terminated  or revoked sooner.       Influenza A by PCR NEGATIVE NEGATIVE Final   Influenza B by PCR NEGATIVE NEGATIVE Final    Comment: (NOTE) The Xpert Xpress SARS-CoV-2/FLU/RSV plus assay is intended as an aid in the diagnosis of influenza from Nasopharyngeal swab specimens and should not be used as a sole basis for treatment. Nasal washings and aspirates are unacceptable for Xpert Xpress SARS-CoV-2/FLU/RSV testing.  Fact Sheet for Patients: EntrepreneurPulse.com.au  Fact Sheet for Healthcare Providers: IncredibleEmployment.be  This test is not yet approved or cleared by the [place] FDA and has been authorized for detection and/or diagnosis of SARS-CoV-2 by FDA under an Emergency Use Authorization (EUA). This EUA will remain in effect (meaning this test can be used) for the duration of the COVID-19 declaration under Section 564(b)(1) of the Act, 21 U.S.C. section 360bbb-3(b)(1), unless the authorization is terminated or revoked.  Performed at [facility], [place] [address] [place],  [address]     Labs: CBC: Recent Labs  Lab 05/06/22 [phone] 05/07/22 [phone] 05/08/22 5462 05/09/22  0932 05/10/22 0210  WBC 6.6 6.1 5.4 5.8 5.1  NEUTROABS 3.9 3.5 3.2 3.6 2.9  HGB 11.3* 11.9* 12.3* 11.8* 11.8*  HCT 32.6* 35.2* 37.4* 36.2* 35.8*  MCV 98.2 100.0 101.6* 100.8* 101.1*  PLT 143* 153 164 175 355   Basic Metabolic Panel: Recent Labs  Lab 05/06/22 0436 05/07/22 0352 05/08/22 0634 05/09/22 0204 05/10/22 0210 05/11/22 0450  NA 138 136 133* 136 135 134*  K 3.3* 4.1 3.8 4.1 4.1 3.9  CL 103 100 98 99 100 101  CO2 '25 28 27 30 28 26  '$ GLUCOSE 123* 109* 112* 124* 119* 119*  BUN 10 5* 7 <5* 7 10  CREATININE 1.15 1.21 1.43* 1.22  1.56* 1.18  CALCIUM 8.8* 9.0 9.1 9.6 9.2 9.0  MG 1.6* 1.8 1.7 1.7 1.9  --    Liver Function Tests: Recent Labs  Lab 05/06/22 0436 05/07/22 0352 05/08/22 0634 05/09/22 0204 05/10/22 0210  AST '24 26 31 30 29  '$ ALT '15 19 21 23 23  '$ ALKPHOS 58 49 60 56 56  BILITOT 0.7 0.8 0.5 0.5 0.5  PROT 5.7* 6.3* 6.4* 6.5 6.6  ALBUMIN 3.0* 3.3* 3.3* 3.4* 3.5   CBG: No results for input(s): "GLUCAP" in the last 168 hours.  Discharge time spent: 39 minutes.   Signed: Hosie Poisson, MD [facility] 05/11/2022

## 2022-05-11 NOTE — Plan of Care (Signed)

## 2022-06-20 ENCOUNTER — Ambulatory Visit: Payer: Managed Care, Other (non HMO) | Admitting: Podiatry

## 2022-06-26 ENCOUNTER — Ambulatory Visit (INDEPENDENT_AMBULATORY_CARE_PROVIDER_SITE_OTHER): Payer: Managed Care, Other (non HMO) | Admitting: Podiatry

## 2022-06-26 DIAGNOSIS — M722 Plantar fascial fibromatosis: Secondary | ICD-10-CM

## 2022-06-26 DIAGNOSIS — Q666 Other congenital valgus deformities of feet: Secondary | ICD-10-CM

## 2022-06-26 NOTE — Progress Notes (Signed)
Subjective:  Patient ID: Kyle Berry, male    DOB: 09-03-1970,  MRN: [id number]  Chief Complaint  Patient presents with   Plantar Fasciitis    52 y.o. male presents with the above complaint. Patient presents with left plantar fibroma that is painful.  Patient states the last few weeks has gotten progressively painful he states that when he standing on his feet is getting bigger.  He went to get it evaluated he has not seen anyone as prior to seeing me he is very flat-footed he wears boots which orthotics.   Review of Systems: Negative except as noted in the HPI. Denies N/V/F/Ch.  Past Medical History:  Diagnosis Date   Atrophy of right kidney    Chronic bronchitis (HCC)    Chronic lower back pain    Chronic pancreatitis ([place])    Gastroparesis 06/2020   confirmed by emptying study at [facility]   GERD (gastroesophageal reflux disease)    Headache    "once/month" (05/13/2017)   High cholesterol    Hypertension    Migraine    "a couple/year" (05/13/2017)   Nephrolithiasis 05/13/2017   Pancreatitis    Pneumonia ~ 09/2015   Presence of pancreatic duct stent    Recurrent acute pancreatitis    Shortness of breath 05/22/2021    Current Outpatient Medications:    albuterol (VENTOLIN HFA) 108 (90 Base) MCG/ACT inhaler, Inhale 2 puffs into the lungs every 6 (six) hours as needed for wheezing or shortness of breath., Disp: , Rfl:    famotidine (PEPCID) 20 MG tablet, Take 1 tablet (20 mg total) by mouth 2 (two) times daily. (Patient taking differently: Take 20 mg by mouth daily as needed for heartburn.), Disp: 60 tablet, Rfl: 5   feeding supplement (ENSURE ENLIVE / ENSURE PLUS) LIQD, Take 237 mLs by mouth 2 (two) times daily between meals. (Patient taking differently: Take 237 mLs by mouth See admin instructions. Drink 237 ml by mouth 2-3 times a day between meals), Disp: 237 mL, Rfl: 12   fluticasone (FLONASE) 50 MCG/ACT nasal spray, Place 1 spray into both nostrils daily as needed for  allergies., Disp: , Rfl:    gabapentin (NEURONTIN) 100 MG capsule, Take 1 capsule (100 mg total) by mouth 3 (three) times daily., Disp: 90 capsule, Rfl: 0   Naphazoline-Pheniramine (VISINE-A OP), Place 2 drops into both eyes daily as needed (itchy or irritated eyes)., Disp: , Rfl:    ondansetron (ZOFRAN) 4 MG tablet, Take 1 tablet (4 mg total) by mouth every 6 (six) hours as needed for nausea., Disp: 20 tablet, Rfl: 0   Pancrelipase, Lip-Prot-Amyl, (PANCREAZE) 21000-54700 units CPEP, Take 1 capsule by mouth with breakfast, with lunch, and with evening meal., Disp: , Rfl:    pantoprazole (PROTONIX) 40 MG tablet, Take 1 tablet (40 mg total) by mouth daily at 6 (six) AM., Disp: 30 tablet, Rfl: 2   polyethylene glycol (MIRALAX / GLYCOLAX) 17 g packet, Take 17 g by mouth daily as needed for mild constipation., Disp: 14 each, Rfl: 0   telmisartan (MICARDIS) 40 MG tablet, Take 1 tablet (40 mg total) by mouth daily., Disp: 30 tablet, Rfl: 0  Social History   Tobacco Use  Smoking Status Never  Smokeless Tobacco Never    Allergies  Allergen Reactions   Diclofenac Hives and Other (See Comments)    Fluid buildup in chest   Norvasc [Amlodipine Besylate] Other (See Comments)    Fluid buildup in chest   Omeprazole Other (See Comments)  MD stopped due to pancreatitis   Hydrochlorothiazide     07/31/2016 -  07/20/2016 -  05/28/2016 -   Other Reaction(s) from [place]: itching   Prednisone Hives and Other (See Comments)    Mood swings   Objective:  There were no vitals filed for this visit. There is no height or weight on file to calculate BMI. Constitutional Well developed. Well nourished.  Vascular Dorsalis pedis pulses palpable bilaterally. Posterior tibial pulses palpable bilaterally. Capillary refill normal to all digits.  No cyanosis or clubbing noted. Pedal hair growth normal.  Neurologic Normal speech. Oriented to person, place, and time. Epicritic sensation to light touch  grossly present bilaterally.  Dermatologic Hard indurated mobile plantar fibroma single lobulated.  Negative transilluminates.  Pain on palpation.  Inflammation around the plantar fibroma  Orthopedic: Normal joint ROM without pain or crepitus bilaterally. No visible deformities. No bony tenderness.   Radiographs: None Assessment:   1. Plantar fibromatosis   2. Pes planovalgus    Plan:  Patient was evaluated and treated and all questions answered.  Left plantar fibroma with underlying Planter fasciitis-pes planovalgus -All questions and concerns were discussed with the patient in extensive detail.  Given the amount of pain that he is experiencing I believe patient will benefit from steroid injection to help decrease acute inflammatory component associate with pain.  Patient agrees with plan like to proceed with steroid injection -A steroid injection was performed at left plantar fibroma using 1% plain Lidocaine and 10 mg of Kenalog. This was well tolerated. -I discussed shoe gear modification in extensive detail -I discussed possible surgical intervention if there is no resolve meant with steroid injection.  He states understanding -He currently wears orthotics with boots.  I encouraged him to continue wearing her   No follow-ups on file.

## 2022-08-28 ENCOUNTER — Other Ambulatory Visit (HOSPITAL_COMMUNITY): Payer: Self-pay

## 2022-09-05 ENCOUNTER — Other Ambulatory Visit: Payer: Self-pay

## 2022-09-06 ENCOUNTER — Other Ambulatory Visit: Payer: Self-pay

## 2022-09-11 ENCOUNTER — Other Ambulatory Visit: Payer: Self-pay

## 2022-09-13 ENCOUNTER — Other Ambulatory Visit (HOSPITAL_COMMUNITY): Payer: Self-pay

## 2022-09-13 MED ORDER — PANCREAZE 37000-97300 UNITS PO CPEP
3.0000 | ORAL_CAPSULE | Freq: Three times a day (TID) | ORAL | 0 refills | Status: DC
  Filled 2022-09-13: qty 270, 30d supply, fill #0

## 2022-09-24 ENCOUNTER — Emergency Department (HOSPITAL_COMMUNITY): Payer: 59

## 2022-09-24 ENCOUNTER — Encounter (HOSPITAL_COMMUNITY): Payer: Self-pay | Admitting: Internal Medicine

## 2022-09-24 ENCOUNTER — Other Ambulatory Visit: Payer: Self-pay

## 2022-09-24 ENCOUNTER — Inpatient Hospital Stay (HOSPITAL_COMMUNITY)
Admission: EM | Admit: 2022-09-24 | Discharge: 2022-09-28 | DRG: 440 | Disposition: A | Discharge status: Home / Self Care | Payer: 59 | Attending: Internal Medicine | Admitting: Internal Medicine

## 2022-09-24 DIAGNOSIS — Z800 Family history of malignant neoplasm of digestive organs: Secondary | ICD-10-CM

## 2022-09-24 DIAGNOSIS — Z841 Family history of disorders of kidney and ureter: Secondary | ICD-10-CM

## 2022-09-24 DIAGNOSIS — Z79899 Other long term (current) drug therapy: Secondary | ICD-10-CM | POA: Diagnosis not present

## 2022-09-24 DIAGNOSIS — K859 Acute pancreatitis without necrosis or infection, unspecified: Principal | ICD-10-CM | POA: Diagnosis present

## 2022-09-24 DIAGNOSIS — Z833 Family history of diabetes mellitus: Secondary | ICD-10-CM

## 2022-09-24 DIAGNOSIS — R1310 Dysphagia, unspecified: Secondary | ICD-10-CM | POA: Diagnosis present

## 2022-09-24 DIAGNOSIS — I10 Essential (primary) hypertension: Secondary | ICD-10-CM | POA: Diagnosis present

## 2022-09-24 DIAGNOSIS — N261 Atrophy of kidney (terminal): Secondary | ICD-10-CM | POA: Diagnosis present

## 2022-09-24 DIAGNOSIS — N200 Calculus of kidney: Secondary | ICD-10-CM | POA: Diagnosis present

## 2022-09-24 DIAGNOSIS — J42 Unspecified chronic bronchitis: Secondary | ICD-10-CM | POA: Diagnosis present

## 2022-09-24 DIAGNOSIS — K861 Other chronic pancreatitis: Secondary | ICD-10-CM | POA: Diagnosis present

## 2022-09-24 DIAGNOSIS — Z8249 Family history of ischemic heart disease and other diseases of the circulatory system: Secondary | ICD-10-CM | POA: Diagnosis not present

## 2022-09-24 DIAGNOSIS — Z885 Allergy status to narcotic agent status: Secondary | ICD-10-CM | POA: Diagnosis not present

## 2022-09-24 DIAGNOSIS — Z9049 Acquired absence of other specified parts of digestive tract: Secondary | ICD-10-CM

## 2022-09-24 DIAGNOSIS — R112 Nausea with vomiting, unspecified: Secondary | ICD-10-CM

## 2022-09-24 DIAGNOSIS — J209 Acute bronchitis, unspecified: Secondary | ICD-10-CM | POA: Diagnosis present

## 2022-09-24 DIAGNOSIS — E7800 Pure hypercholesterolemia, unspecified: Secondary | ICD-10-CM | POA: Diagnosis present

## 2022-09-24 DIAGNOSIS — Z888 Allergy status to other drugs, medicaments and biological substances status: Secondary | ICD-10-CM

## 2022-09-24 DIAGNOSIS — Z87442 Personal history of urinary calculi: Secondary | ICD-10-CM | POA: Diagnosis not present

## 2022-09-24 DIAGNOSIS — K852 Alcohol induced acute pancreatitis without necrosis or infection: Secondary | ICD-10-CM | POA: Diagnosis not present

## 2022-09-24 DIAGNOSIS — K219 Gastro-esophageal reflux disease without esophagitis: Secondary | ICD-10-CM | POA: Diagnosis present

## 2022-09-24 DIAGNOSIS — F1010 Alcohol abuse, uncomplicated: Secondary | ICD-10-CM | POA: Diagnosis present

## 2022-09-24 DIAGNOSIS — K760 Fatty (change of) liver, not elsewhere classified: Secondary | ICD-10-CM | POA: Diagnosis present

## 2022-09-24 LAB — URINALYSIS, ROUTINE W REFLEX MICROSCOPIC
Bilirubin Urine: NEGATIVE
Glucose, UA: NEGATIVE mg/dL
Hgb urine dipstick: NEGATIVE
Ketones, ur: NEGATIVE mg/dL
Leukocytes,Ua: NEGATIVE
Nitrite: NEGATIVE
Protein, ur: NEGATIVE mg/dL
Specific Gravity, Urine: 1.041 — ABNORMAL HIGH (ref 1.005–1.030)
pH: 5 (ref 5.0–8.0)

## 2022-09-24 LAB — TROPONIN I (HIGH SENSITIVITY)
Troponin I (High Sensitivity): 3 ng/L (ref <18)
Troponin I (High Sensitivity): 4 ng/L (ref <18)

## 2022-09-24 LAB — COMPREHENSIVE METABOLIC PANEL
ALT: 28 U/L (ref 0–44)
AST: 50 U/L — ABNORMAL HIGH (ref 15–41)
Albumin: 4 g/dL (ref 3.5–5.0)
Alkaline Phosphatase: 69 U/L (ref 38–126)
Anion gap: 13 (ref 5–15)
BUN: 8 mg/dL (ref 6–20)
CO2: 21 mmol/L — ABNORMAL LOW (ref 22–32)
Calcium: 8.9 mg/dL (ref 8.9–10.3)
Chloride: 102 mmol/L (ref 98–111)
Creatinine, Ser: 1.27 mg/dL — ABNORMAL HIGH (ref 0.61–1.24)
GFR, Estimated: >60 mL/min (ref >60)
Glucose, Bld: 163 mg/dL — ABNORMAL HIGH (ref 70–99)
Potassium: 3.5 mmol/L (ref 3.5–5.1)
Sodium: 136 mmol/L (ref 135–145)
Total Bilirubin: 2 mg/dL — ABNORMAL HIGH (ref 0.3–1.2)
Total Protein: 7.3 g/dL (ref 6.5–8.1)

## 2022-09-24 LAB — CBC
HCT: 40.3 % (ref 39.0–52.0)
Hemoglobin: 13.4 g/dL (ref 13.0–17.0)
MCH: 33.5 pg (ref 26.0–34.0)
MCHC: 33.3 g/dL (ref 30.0–36.0)
MCV: 100.8 fL — ABNORMAL HIGH (ref 80.0–100.0)
Platelets: 158 10*3/uL (ref 150–400)
RBC: 4 MIL/uL — ABNORMAL LOW (ref 4.22–5.81)
RDW: 14.7 % (ref 11.5–15.5)
WBC: 13.3 10*3/uL — ABNORMAL HIGH (ref 4.0–10.5)
nRBC: 0 % (ref 0.0–0.2)

## 2022-09-24 LAB — TRIGLYCERIDES: Triglycerides: 133 mg/dL (ref <150)

## 2022-09-24 LAB — ETHANOL: Alcohol, Ethyl (B): <10 mg/dL (ref <10)

## 2022-09-24 LAB — LIPASE, BLOOD: Lipase: 177 U/L — ABNORMAL HIGH (ref 11–51)

## 2022-09-24 MED ORDER — SODIUM CHLORIDE 0.9 % IV SOLN: 1000.0000 mL | INTRAVENOUS | Status: DC

## 2022-09-24 MED ORDER — OXYCODONE HCL 5 MG PO TABS
5.0000 mg | ORAL_TABLET | ORAL | Status: DC | PRN
  Administered 2022-09-24 – 2022-09-28 (×9): 5 mg via ORAL
  Filled 2022-09-24 (×9): qty 1

## 2022-09-24 MED ORDER — PANCRELIPASE (LIP-PROT-AMYL) 37000-97300 UNITS PO CPEP: 3.0000 | ORAL_CAPSULE | Freq: Three times a day (TID) | ORAL | Status: DC

## 2022-09-24 MED ORDER — GABAPENTIN 100 MG PO CAPS
100.0000 mg | ORAL_CAPSULE | Freq: Three times a day (TID) | ORAL | Status: DC
  Administered 2022-09-25 – 2022-09-28 (×9): 100 mg via ORAL
  Filled 2022-09-24 (×9): qty 1

## 2022-09-24 MED ORDER — SODIUM CHLORIDE 0.9% FLUSH
3.0000 mL | Freq: Two times a day (BID) | INTRAVENOUS | Status: DC
  Administered 2022-09-24 – 2022-09-28 (×8): 3 mL via INTRAVENOUS

## 2022-09-24 MED ORDER — ONDANSETRON HCL 4 MG/2ML IJ SOLN
4.0000 mg | Freq: Once | INTRAMUSCULAR | Status: AC
  Administered 2022-09-24: 4 mg via INTRAVENOUS
  Filled 2022-09-24: qty 2

## 2022-09-24 MED ORDER — MORPHINE SULFATE (PF) 4 MG/ML IV SOLN
6.0000 mg | Freq: Once | INTRAVENOUS | Status: AC
  Administered 2022-09-24: 6 mg via INTRAVENOUS
  Filled 2022-09-24: qty 2

## 2022-09-24 MED ORDER — FOLIC ACID 1 MG PO TABS
1.0000 mg | ORAL_TABLET | Freq: Every day | ORAL | Status: DC
  Administered 2022-09-24 – 2022-09-28 (×4): 1 mg via ORAL
  Filled 2022-09-24 (×4): qty 1

## 2022-09-24 MED ORDER — ONDANSETRON HCL 4 MG PO TABS
4.0000 mg | ORAL_TABLET | Freq: Four times a day (QID) | ORAL | Status: DC | PRN
  Administered 2022-09-24: 4 mg via ORAL
  Filled 2022-09-24: qty 1

## 2022-09-24 MED ORDER — THIAMINE HCL 100 MG/ML IJ SOLN
100.0000 mg | Freq: Every day | INTRAMUSCULAR | Status: DC
  Administered 2022-09-26 – 2022-09-27 (×2): 100 mg via INTRAVENOUS
  Filled 2022-09-24 (×2): qty 2

## 2022-09-24 MED ORDER — FLUTICASONE PROPIONATE 50 MCG/ACT NA SUSP: 1.0000 | Freq: Every day | NASAL | Status: DC | PRN

## 2022-09-24 MED ORDER — ALBUTEROL SULFATE (2.5 MG/3ML) 0.083% IN NEBU: 2.5000 mg | INHALATION_SOLUTION | Freq: Four times a day (QID) | RESPIRATORY_TRACT | Status: DC | PRN

## 2022-09-24 MED ORDER — SODIUM CHLORIDE 0.9 % IV SOLN
25.0000 mg | Freq: Four times a day (QID) | INTRAVENOUS | Status: DC | PRN
  Administered 2022-09-24 – 2022-09-25 (×2): 25 mg via INTRAVENOUS
  Filled 2022-09-24: qty 1
  Filled 2022-09-24 (×2): qty 25

## 2022-09-24 MED ORDER — ENOXAPARIN SODIUM 30 MG/0.3ML IJ SOSY
30.0000 mg | PREFILLED_SYRINGE | INTRAMUSCULAR | Status: DC
  Administered 2022-09-24: 30 mg via SUBCUTANEOUS
  Filled 2022-09-24: qty 0.3

## 2022-09-24 MED ORDER — FAMOTIDINE 20 MG PO TABS
20.0000 mg | ORAL_TABLET | Freq: Two times a day (BID) | ORAL | Status: DC
  Administered 2022-09-24 – 2022-09-28 (×7): 20 mg via ORAL
  Filled 2022-09-24 (×7): qty 1

## 2022-09-24 MED ORDER — ONDANSETRON HCL 4 MG/2ML IJ SOLN
4.0000 mg | Freq: Four times a day (QID) | INTRAMUSCULAR | Status: DC | PRN
  Administered 2022-09-24: 4 mg via INTRAVENOUS
  Filled 2022-09-24: qty 2

## 2022-09-24 MED ORDER — PANCRELIPASE (LIP-PROT-AMYL) 12000-38000 UNITS PO CPEP
24000.0000 [IU] | ORAL_CAPSULE | Freq: Three times a day (TID) | ORAL | Status: DC
  Administered 2022-09-25 – 2022-09-28 (×9): 24000 [IU] via ORAL
  Filled 2022-09-24 (×13): qty 2

## 2022-09-24 MED ORDER — LORAZEPAM 1 MG PO TABS: 1.0000 mg | ORAL_TABLET | ORAL | Status: DC | PRN

## 2022-09-24 MED ORDER — MORPHINE SULFATE (PF) 2 MG/ML IV SOLN
2.0000 mg | INTRAVENOUS | Status: DC | PRN
  Administered 2022-09-24 – 2022-09-26 (×8): 2 mg via INTRAVENOUS
  Filled 2022-09-24 (×8): qty 1

## 2022-09-24 MED ORDER — HYDRALAZINE HCL 20 MG/ML IJ SOLN: 5.0000 mg | Freq: Four times a day (QID) | INTRAMUSCULAR | Status: DC | PRN

## 2022-09-24 MED ORDER — LORAZEPAM 2 MG/ML IJ SOLN: 1.0000 mg | INTRAMUSCULAR | Status: DC | PRN

## 2022-09-24 MED ORDER — LACTATED RINGERS IV BOLUS
2000.0000 mL | Freq: Once | INTRAVENOUS | Status: AC
  Administered 2022-09-24: 2000 mL via INTRAVENOUS

## 2022-09-24 MED ORDER — IOHEXOL 350 MG/ML SOLN
75.0000 mL | Freq: Once | INTRAVENOUS | Status: AC | PRN
  Administered 2022-09-24: 75 mL via INTRAVENOUS

## 2022-09-24 MED ORDER — SODIUM CHLORIDE 0.9 % IV BOLUS (SEPSIS): 1000.0000 mL | Freq: Once | INTRAVENOUS | Status: DC

## 2022-09-24 MED ORDER — POLYETHYLENE GLYCOL 3350 17 G PO PACK: 17.0000 g | PACK | Freq: Every day | ORAL | Status: DC | PRN

## 2022-09-24 MED ORDER — ACETAMINOPHEN 650 MG RE SUPP: 650.0000 mg | Freq: Four times a day (QID) | RECTAL | Status: DC | PRN

## 2022-09-24 MED ORDER — ENOXAPARIN SODIUM 40 MG/0.4ML IJ SOSY
40.0000 mg | PREFILLED_SYRINGE | INTRAMUSCULAR | Status: DC
  Administered 2022-09-25 – 2022-09-27 (×3): 40 mg via SUBCUTANEOUS
  Filled 2022-09-24 (×3): qty 0.4

## 2022-09-24 MED ORDER — ADULT MULTIVITAMIN W/MINERALS CH
1.0000 | ORAL_TABLET | Freq: Every day | ORAL | Status: DC
  Administered 2022-09-24 – 2022-09-28 (×4): 1 via ORAL
  Filled 2022-09-24 (×4): qty 1

## 2022-09-24 MED ORDER — THIAMINE MONONITRATE 100 MG PO TABS
100.0000 mg | ORAL_TABLET | Freq: Every day | ORAL | Status: DC
  Administered 2022-09-24 – 2022-09-28 (×2): 100 mg via ORAL
  Filled 2022-09-24 (×3): qty 1

## 2022-09-24 MED ORDER — ACETAMINOPHEN 325 MG PO TABS
650.0000 mg | ORAL_TABLET | Freq: Four times a day (QID) | ORAL | Status: DC | PRN
  Administered 2022-09-28: 650 mg via ORAL
  Filled 2022-09-24: qty 2

## 2022-09-24 MED ORDER — IRBESARTAN 75 MG PO TABS
75.0000 mg | ORAL_TABLET | Freq: Every day | ORAL | Status: DC
  Administered 2022-09-26 – 2022-09-28 (×3): 75 mg via ORAL
  Filled 2022-09-24 (×5): qty 1

## 2022-09-24 MED ORDER — PANTOPRAZOLE SODIUM 40 MG PO TBEC
40.0000 mg | DELAYED_RELEASE_TABLET | Freq: Every day | ORAL | Status: DC
  Administered 2022-09-26 – 2022-09-28 (×3): 40 mg via ORAL
  Filled 2022-09-24 (×3): qty 1

## 2022-09-24 NOTE — ED Provider Notes (Signed)
[place] EMERGENCY DEPARTMENT AT [facility] Provider Note   CSN: [id number] Arrival date & time: 09/24/22  4098     History {Add pertinent medical, surgical, social history, OB history to HPI:1} Chief Complaint  Patient presents with   Abdominal Pain    Kyle Berry is a 52 y.o. male.  52 year old male with a history of chronic pancreatitis (history of pancreatic divisum and pancreatic duct placement at [facility] that was removed), solitary left kidney, hypertension, and alcohol use who presents emergency department with nausea and vomiting for the past 2 months that worsened over the last few days.  Says he has burning epigastric abdominal pain that radiates to the sides of his back.  Worsened with eating.  Has had copious nonbloody nonbilious emesis since then.  Unsure of fevers.  No diarrhea.  Has had a cholecystectomy in the past.  Denies any heavy alcohol use but did have some wine last night.       Home Medications Prior to Admission medications   Medication Sig Start Date End Date Taking? Authorizing Provider  albuterol (VENTOLIN HFA) 108 (90 Base) MCG/ACT inhaler Inhale 2 puffs into the lungs every 6 (six) hours as needed for wheezing or shortness of breath.    [provider]  famotidine (PEPCID) 20 MG tablet Take 1 tablet (20 mg total) by mouth 2 (two) times daily. Patient taking differently: Take 20 mg by mouth daily as needed for heartburn. 09/26/21 05/06/23  Dorcas Carrow, MD  feeding supplement (ENSURE ENLIVE / ENSURE PLUS) LIQD Take 237 mLs by mouth 2 (two) times daily between meals. Patient taking differently: Take 237 mLs by mouth See admin instructions. Drink 237 ml by mouth 2-3 times a day between meals 02/04/21   Dahal, Melina Schools, MD  fluticasone (FLONASE) 50 MCG/ACT nasal spray Place 1 spray into both nostrils daily as needed for allergies. 01/15/22   [provider]  gabapentin (NEURONTIN) 100 MG capsule Take 1 capsule (100 mg total) by  mouth 3 (three) times daily. 10/27/21   Rolly Salter, MD  Naphazoline-Pheniramine (VISINE-A OP) Place 2 drops into both eyes daily as needed (itchy or irritated eyes).    [provider]  ondansetron (ZOFRAN) 4 MG tablet Take 1 tablet (4 mg total) by mouth every 6 (six) hours as needed for nausea. 05/11/22   Kathlen Mody, MD  Pancrelipase, Lip-Prot-Amyl, (PANCREAZE) [phone] units CPEP Take 1 capsule by mouth with breakfast, with lunch, and with evening meal.    [provider]  Pancrelipase, Lip-Prot-Amyl, (PANCREAZE) 37000-97300 units CPEP Take 3 capsules (111,000 Units total) by mouth 3 (three) times daily. 09/13/22     pantoprazole (PROTONIX) 40 MG tablet Take 1 tablet (40 mg total) by mouth daily at 6 (six) AM. 05/12/22   Kathlen Mody, MD  polyethylene glycol (MIRALAX / GLYCOLAX) 17 g packet Take 17 g by mouth daily as needed for mild constipation. 05/11/22   Kathlen Mody, MD  telmisartan (MICARDIS) 40 MG tablet Take 1 tablet (40 mg total) by mouth daily. 03/07/22 05/06/23  Meredeth Ide, MD      Allergies    Diclofenac, Norvasc [amlodipine besylate], Omeprazole, Hydrochlorothiazide, and Prednisone    Review of Systems   Review of Systems  Physical Exam Updated Vital Signs BP (!) 152/94   Pulse 92   Temp 98.1 F (36.7 C)   Resp 20   Ht 6\' 1"  (1.854 m)   Wt 74.8 kg   SpO2 98%   BMI 21.77 kg/m  Physical Exam Vitals and nursing note reviewed.  Constitutional:      General: He is not in acute distress.    Appearance: He is well-developed.  HENT:     Head: Normocephalic and atraumatic.     Right Ear: External ear normal.     Left Ear: External ear normal.     Nose: Nose normal.  Eyes:     Extraocular Movements: Extraocular movements intact.     Conjunctiva/sclera: Conjunctivae normal.     Pupils: Pupils are equal, round, and reactive to light.  Cardiovascular:     Rate and Rhythm: Normal rate and regular rhythm.  Pulmonary:     Effort: Pulmonary  effort is normal. No respiratory distress.  Abdominal:     General: There is no distension.     Palpations: Abdomen is soft. There is no mass.     Tenderness: There is abdominal tenderness (Epigastric). There is no guarding.  Musculoskeletal:     Cervical back: Normal range of motion and neck supple.     Right lower leg: No edema.     Left lower leg: No edema.  Skin:    General: Skin is warm and dry.  Neurological:     Mental Status: He is alert. Mental status is at baseline.  Psychiatric:        Mood and Affect: Mood normal.        Behavior: Behavior normal.     ED Results / Procedures / Treatments   Labs (all labs ordered are listed, but only abnormal results are displayed) Labs Reviewed  LIPASE, BLOOD - Abnormal; Notable for the following components:      Result Value   Lipase 177 (*)    All other components within normal limits  COMPREHENSIVE METABOLIC PANEL - Abnormal; Notable for the following components:   CO2 21 (*)    Glucose, Bld 163 (*)    Creatinine, Ser 1.27 (*)    AST 50 (*)    Total Bilirubin 2.0 (*)    All other components within normal limits  CBC - Abnormal; Notable for the following components:   WBC 13.3 (*)    RBC 4.00 (*)    MCV 100.8 (*)    All other components within normal limits  URINALYSIS, ROUTINE W REFLEX MICROSCOPIC  ETHANOL    EKG None  Radiology No results found.  Procedures Procedures  {Document cardiac monitor, telemetry assessment procedure when appropriate:1}  Medications Ordered in ED Medications  lactated ringers bolus 2,000 mL (has no administration in time range)  morphine (PF) 4 MG/ML injection 6 mg (has no administration in time range)  ondansetron (ZOFRAN) injection 4 mg (has no administration in time range)    ED Course/ Medical Decision Making/ A&P   {   Click here for ABCD2, HEART and other calculatorsREFRESH Note before signing :1}                          Medical Decision Making Amount and/or Complexity  of Data Reviewed Labs: ordered.  Risk Prescription drug management.   ***  {Document critical care time when appropriate:1} {Document review of labs and clinical decision tools ie heart score, Chads2Vasc2 etc:1}  {Document your independent review of radiology images, and any outside records:1} {Document your discussion with family members, caretakers, and with consultants:1} {Document social determinants of health affecting pt's care:1} {Document your decision making why or why not admission, treatments were needed:1} Final Clinical Impression(s) / ED  Diagnoses Final diagnoses:  None    Rx / DC Orders ED Discharge Orders     None

## 2022-09-24 NOTE — ED Notes (Signed)
ED TO INPATIENT HANDOFF REPORT  ED Nurse Name and Phone #: Beatris Ship RN [phone]  S Name/Age/Gender Kyle Berry 52 y.o. male Room/Bed: 040C/040C  Code Status   Code Status: Full Code  Home/SNF/Other Home Patient oriented to: self, place, time, and situation Is this baseline? Yes   Triage Complete: Triage complete  Chief Complaint Acute pancreatitis [K85.90]  Triage Note Patient coming to ED for evaluation of RUQ abdominal pain.  Reports hx of pancreatitis.  Has been taking enzymes to help prevent reoccurrence.  States he is currently out and pain started recently.  + nausea/vomiting/fevers.      Allergies Allergies  Allergen Reactions   Norvasc [Amlodipine Besylate] Other (See Comments)    Fluid buildup in chest   Voltaren [Diclofenac] Hives and Other (See Comments)    Fluid buildup in chest   Prilosec [Omeprazole] Other (See Comments)    MD stopped due to pancreatitis   Hydrochlorothiazide Itching   Ultram [Tramadol] Other (See Comments)    Stomach upset   Prednisone Hives and Other (See Comments)    Mood swings    Level of Care/Admitting Diagnosis ED Disposition     ED Disposition  Admit   Condition  --   Comment  Hospital Area: [facility] [[id number]]  Level of Care: Med-Surg [16]  May place patient in observation at [facility] or [facility] if equivalent level of care is available:: Yes  Covid Evaluation: Asymptomatic - no recent exposure (last 10 days) testing not required  Diagnosis: Acute pancreatitis [577.0.ICD-9-CM]  Admitting Physician: Lilia Pro [[id number]]  Attending Physician: Lilia Pro [[id number]]          B Medical/Surgery History Past Medical History:  Diagnosis Date   Atrophy of right kidney    Chronic bronchitis (HCC)    Chronic lower back pain    Chronic pancreatitis (HCC)    Gastroparesis 06/2020   confirmed by emptying study at [facility]   GERD (gastroesophageal reflux disease)    Headache     "once/month" (05/13/2017)   High cholesterol    Hypertension    Migraine    "a couple/year" (05/13/2017)   Nephrolithiasis 05/13/2017   Pancreatitis    Pneumonia ~ 09/2015   Presence of pancreatic duct stent    Recurrent acute pancreatitis    Shortness of breath 05/22/2021   Past Surgical History:  Procedure Laterality Date   BIOPSY  11/02/2020   Procedure: BIOPSY;  Surgeon: [name]., MD;  Location: [facility] ENDOSCOPY;  Service: Gastroenterology;;   CHOLECYSTECTOMY N/A 10/23/2017   Procedure: LAPAROSCOPIC CHOLECYSTECTOMY WITH INTRAOPERATIVE CHOLANGIOGRAM;  Surgeon: Luretha Murphy, MD;  Location: [facility] ORS;  Service: General;  Laterality: N/A;   COLONOSCOPY WITH PROPOFOL N/A 11/02/2019   Procedure: COLONOSCOPY WITH PROPOFOL;  Surgeon: Hilarie Fredrickson, MD;  Location: [facility] ENDOSCOPY;  Service: Endoscopy;  Laterality: N/A;   ERCP N/A 11/02/2020   Procedure: ENDOSCOPIC RETROGRADE CHOLANGIOPANCREATOGRAPHY (ERCP);  Surgeon: [name]., MD;  Location: [facility] ENDOSCOPY;  Service: Gastroenterology;  Laterality: N/A;   ESOPHAGOGASTRODUODENOSCOPY N/A 11/02/2020   Procedure: ESOPHAGOGASTRODUODENOSCOPY (EGD);  Surgeon: [name]., MD;  Location: [facility] ENDOSCOPY;  Service: Gastroenterology;  Laterality: N/A;   ESOPHAGOGASTRODUODENOSCOPY (EGD) WITH PROPOFOL N/A 11/06/2019   Procedure: ESOPHAGOGASTRODUODENOSCOPY (EGD) WITH PROPOFOL;  Surgeon: Hilarie Fredrickson, MD;  Location[facility] ENDOSCOPY;  Service: Endoscopy;  Laterality: N/A;   HEMOSTASIS CLIP PLACEMENT  11/02/2019   Procedure: HEMOSTASIS CLIP PLACEMENT;  Surgeon: Hilarie Fredrickson, MD;  Location: [facility] ENDOSCOPY;  Service: Endoscopy;;   NO PAST SURGERIES     REMOVAL OF STONES  11/02/2020   Procedure: REMOVAL OF STONES;  Surgeon: [name]., MD;  Location: [facility] ENDOSCOPY;  Service: Gastroenterology;;   Francine Graven REMOVAL  11/02/2020   Procedure: STENT REMOVAL;  Surgeon: [name]., MD;  Location: [facility] ENDOSCOPY;  Service:  Gastroenterology;;     A IV Location/Drains/Wounds Patient Lines/Drains/Airways Status     Active Line/Drains/Airways     Name Placement date Placement time Site Days   Peripheral IV 05/06/22 20 G 1.88" Anterior;Right Forearm 05/06/22  1015  Forearm  141   Peripheral IV 09/24/22 20 G Anterior;Proximal;Right Forearm 09/24/22  0855  Forearm  less than 1            Intake/Output Last 24 hours No intake or output data in the 24 hours ending 09/24/22 1259  Labs/Imaging Results for orders placed or performed during the hospital encounter of 09/24/22 (from the past 48 hour(s))  Urinalysis, Routine w reflex microscopic -Urine, Clean Catch     Status: Abnormal   Collection Time: 09/24/22  7:06 AM  Result Value Ref Range   Color, Urine YELLOW YELLOW   APPearance CLEAR CLEAR   Specific Gravity, Urine 1.041 (H) 1.005 - 1.030   pH 5.0 5.0 - 8.0   Glucose, UA NEGATIVE NEGATIVE mg/dL   Hgb urine dipstick NEGATIVE NEGATIVE   Bilirubin Urine NEGATIVE NEGATIVE   Ketones, ur NEGATIVE NEGATIVE mg/dL   Protein, ur NEGATIVE NEGATIVE mg/dL   Nitrite NEGATIVE NEGATIVE   Leukocytes,Ua NEGATIVE NEGATIVE    Comment: Performed at [facility] Lab, [address] [place], [place] [address]  Lipase, blood     Status: Abnormal   Collection Time: 09/24/22  7:10 AM  Result Value Ref Range   Lipase 177 (H) 11 - 51 U/L    Comment: Performed at [facility], [address], [place], [place] [address]  Comprehensive metabolic panel     Status: Abnormal   Collection Time: 09/24/22  7:10 AM  Result Value Ref Range   Sodium 136 135 - 145 mmol/L   Potassium 3.5 3.5 - 5.1 mmol/L   Chloride 102 98 - 111 mmol/L   CO2 21 (L) 22 - 32 mmol/L   Glucose, Bld 163 (H) 70 - 99 mg/dL    Comment: Glucose reference range applies only to samples taken after fasting for at least 8 hours.   BUN 8 6 - 20 mg/dL   Creatinine, Ser 2.95 (H) 0.61 - 1.24 mg/dL   Calcium 8.9 8.9 - 28.4 mg/dL   Total Protein 7.3 6.5 -  8.1 g/dL   Albumin 4.0 3.5 - 5.0 g/dL   AST 50 (H) 15 - 41 U/L   ALT 28 0 - 44 U/L   Alkaline Phosphatase 69 38 - 126 U/L   Total Bilirubin 2.0 (H) 0.3 - 1.2 mg/dL   GFR, Estimated >13 >24 mL/min    Comment: (NOTE) Calculated using the CKD-EPI Creatinine Equation (2021)    Anion gap 13 5 - 15    Comment: Performed at [facility], [address], [place], [place] [address]  CBC     Status: Abnormal   Collection Time: 09/24/22  7:10 AM  Result Value Ref Range   WBC 13.3 (H) 4.0 - 10.5 K/uL   RBC 4.00 (L) 4.22 - 5.81 MIL/uL   Hemoglobin 13.4 13.0 - 17.0 g/dL   HCT 72.5 36.6 - 44.0 %  MCV 100.8 (H) 80.0 - 100.0 fL   MCH 33.5 26.0 - 34.0 pg   MCHC 33.3 30.0 - 36.0 g/dL   RDW 40.9 81.1 - 91.4 %   Platelets 158 150 - 400 K/uL   nRBC 0.0 0.0 - 0.2 %    Comment: Performed at [facility], [address] [place], [place] [address]  Troponin I (High Sensitivity)     Status: None   Collection Time: 09/24/22  7:10 AM  Result Value Ref Range   Troponin I (High Sensitivity) 3 <18 ng/L    Comment: (NOTE) Elevated high sensitivity troponin I (hsTnI) values and significant  changes across serial measurements may suggest ACS but many other  chronic and acute conditions are known to elevate hsTnI results.  Refer to the "Links" section for chest pain algorithms and additional  guidance. Performed at [facility], [address] [place], [place] [address]   Ethanol     Status: None   Collection Time: 09/24/22  8:44 AM  Result Value Ref Range   Alcohol, Ethyl (B) <10 <10 mg/dL    Comment: (NOTE) Lowest detectable limit for serum alcohol is 10 mg/dL.  For medical purposes only. Performed at [facility], [address], [place], [place] [address]   Troponin I (High Sensitivity)     Status: None   Collection Time: 09/24/22 11:30 AM  Result Value Ref Range   Troponin I (High Sensitivity) 4 <18 ng/L    Comment: (NOTE) Elevated high sensitivity troponin I  (hsTnI) values and significant  changes across serial measurements may suggest ACS but many other  chronic and acute conditions are known to elevate hsTnI results.  Refer to the "Links" section for chest pain algorithms and additional  guidance. Performed at [facility], [address] [place], [place] [address]    CT ABDOMEN PELVIS W CONTRAST  Result Date: 09/24/2022 CLINICAL DATA:  Pancreatitis, acute, severe. EXAM: CT ABDOMEN AND PELVIS WITH CONTRAST TECHNIQUE: Multidetector CT imaging of the abdomen and pelvis was performed using the standard protocol following bolus administration of intravenous contrast. RADIATION DOSE REDUCTION: This exam was performed according to the departmental dose-optimization program which includes automated exposure control, adjustment of the mA and/or kV according to patient size and/or use of iterative reconstruction technique. CONTRAST:  75mL OMNIPAQUE IOHEXOL 350 MG/ML SOLN COMPARISON:  CT abdomen/pelvis 05/04/2022. FINDINGS: Lower chest: No acute abnormality. Hepatobiliary: Hepatic steatosis. Prior cholecystectomy. No biliary dilatation. Pancreas: Edema and peripancreatic fat stranding with mild ductal dilatation predominantly involving the pancreatic head and uncinate process, consistent with acute pancreatitis. No peripancreatic fluid collection. Spleen: Normal. Adrenals/Urinary Tract: Adrenal glands are unremarkable. Unchanged 6 mm calyceal calculus in the lower pole left kidney. No mass or hydronephrosis. Absent right kidney. Bladder is unremarkable. Stomach/Bowel: Normal stomach. Inflammation along the second and third portions of the duodenum. No dilated loops of small bowel. Normal appendix is visualized on coronal image 76 series 5. Colon is decompressed. No wall thickening or surrounding inflammation. Vascular/Lymphatic: No significant vascular findings are present. No enlarged abdominal or pelvic lymph nodes. Reproductive: Prostate is unremarkable.  Other: No abdominal wall hernia or abnormality. No abdominopelvic ascites. Musculoskeletal: No acute bony abnormality. Ankylosis of the right SI joint. Severe disc height loss at L5-S1. IMPRESSION: 1. Findings consistent with acute pancreatitis. No peripancreatic fluid collection. 2. Hepatic steatosis. 3. Nonobstructing left renal calculus. Electronically Signed   By: Orvan Falconer M.D.   On: 09/24/2022 11:20  DG Chest Portable 1 View  Result Date: 09/24/2022 CLINICAL DATA:  cp EXAM: PORTABLE CHEST 1 VIEW COMPARISON:  Chest radiograph 12/23/2020 FINDINGS: The heart size and mediastinal contours are within normal limits. Both lungs are clear. The visualized skeletal structures are unremarkable. IMPRESSION: No active disease. Electronically Signed   By: Lorenza Cambridge M.D.   On: 09/24/2022 10:35    Pending Labs Unresulted Labs (From admission, onward)     Start     Ordered   09/25/22 0500  Comprehensive metabolic panel  Tomorrow morning,   R        09/24/22 1226   09/25/22 0500  CBC  Tomorrow morning,   R        09/24/22 1226   09/24/22 1243  Triglycerides  Once,   R        09/24/22 1242   09/24/22 1220  HIV Antibody (routine testing w rflx)  (HIV Antibody (Routine testing w reflex) panel)  Once,   R        09/24/22 1226            Vitals/Pain Today's Vitals   09/24/22 1200 09/24/22 1215 09/24/22 1230 09/24/22 1245  BP: (!) 154/92 (!) 174/106 (!) 147/91 (!) 150/91  Pulse: 92 93 92 83  Resp: (!) 21 18 17 15   Temp:      SpO2: 100% 100% 98% 97%  Weight:      Height:      PainSc:        Isolation Precautions No active isolations  Medications Medications  fluticasone (FLONASE) 50 MCG/ACT nasal spray 1 spray (has no administration in time range)  polyethylene glycol (MIRALAX / GLYCOLAX) packet 17 g (has no administration in time range)  irbesartan (AVAPRO) tablet 75 mg (has no administration in time range)  famotidine (PEPCID) tablet 20 mg (has no administration in time range)   pantoprazole (PROTONIX) EC tablet 40 mg (has no administration in time range)  gabapentin (NEURONTIN) capsule 100 mg (has no administration in time range)  albuterol (PROVENTIL) (2.5 MG/3ML) 0.083% nebulizer solution 2.5 mg (has no administration in time range)  enoxaparin (LOVENOX) injection 30 mg (has no administration in time range)  sodium chloride flush (NS) 0.9 % injection 3 mL (0 mLs Intravenous Hold 09/24/22 1257)  acetaminophen (TYLENOL) tablet 650 mg (has no administration in time range)    Or  acetaminophen (TYLENOL) suppository 650 mg (has no administration in time range)  ondansetron (ZOFRAN) tablet 4 mg (has no administration in time range)    Or  ondansetron (ZOFRAN) injection 4 mg (has no administration in time range)  hydrALAZINE (APRESOLINE) injection 5 mg (has no administration in time range)  morphine (PF) 2 MG/ML injection 2 mg (has no administration in time range)  oxyCODONE (Oxy IR/ROXICODONE) immediate release tablet 5 mg (has no administration in time range)  LORazepam (ATIVAN) tablet 1-4 mg (has no administration in time range)    Or  LORazepam (ATIVAN) injection 1-4 mg (has no administration in time range)  thiamine (VITAMIN B1) tablet 100 mg (has no administration in time range)    Or  thiamine (VITAMIN B1) injection 100 mg (has no administration in time range)  folic acid (FOLVITE) tablet 1 mg (has no administration in time range)  multivitamin with minerals tablet 1 tablet (has no administration in time range)  lipase/protease/amylase (CREON) capsule 24,000 Units (has no administration in time range)  lactated ringers bolus 2,000 mL (2,000 mLs Intravenous New Bag/Given 09/24/22 0855)  morphine (PF)  4 MG/ML injection 6 mg (6 mg Intravenous Given 09/24/22 0855)  ondansetron (ZOFRAN) injection 4 mg (4 mg Intravenous Given 09/24/22 0855)  ondansetron (ZOFRAN) injection 4 mg (4 mg Intravenous Given 09/24/22 0958)  morphine (PF) 4 MG/ML injection 6 mg (6 mg Intravenous  Given 09/24/22 0958)  iohexol (OMNIPAQUE) 350 MG/ML injection 75 mL (75 mLs Intravenous Contrast Given 09/24/22 1037)  morphine (PF) 4 MG/ML injection 6 mg (6 mg Intravenous Given 09/24/22 1208)    Mobility walks     R Recommendations: See Admitting Provider Note  Report given to: [id number]

## 2022-09-24 NOTE — ED Notes (Signed)
Patient transported to CT 

## 2022-09-24 NOTE — ED Triage Notes (Signed)
Patient coming to ED for evaluation of RUQ abdominal pain.  Reports hx of pancreatitis.  Has been taking enzymes to help prevent reoccurrence.  States he is currently out and pain started recently.  + nausea/vomiting/fevers.

## 2022-09-24 NOTE — H&P (Signed)
History and Physical    Patient: Kyle Berry:[id number] DOB: [birth year] DOA: 09/24/2022 DOS: the patient was seen and examined on 09/24/2022 PCP: [facility]  Patient coming from: Home  Chief Complaint:  Chief Complaint  Patient presents with   Abdominal Pain   HPI: Kyle Berry is a 52 y.o. male with medical history significant of idiopathic chronic pancreatitis with frequent readmissions in the past. Other comorbidities include atrophic kidney, hypertension.  He has been in his usual state of health, admits to drinking some wine last night.  This morning, he woke up with intractable abdominal pain/epigastric pain with associated nausea and vomiting.  Symptoms were consistent with his chronic pancreatitis flare.  He also admits having run out of his Creon over the past 4 days.    Workup in the ED revealed elevated lipase levels.  CT scan of the abdomen confirmed acute pancreatitis without any obvious fluid collection.  Nonobstructing left renal calculi were also noted.  Patient was initiated on IV fluids and IV pain medications in the ED.  He was referred to hospitalist service for admission for optimization of symptoms.  He also complains of chest .  Troponin however remains unremarkable and flat less than 5.   Review of Systems: As mentioned in the history of present illness. All other systems reviewed and are negative. Past Medical History:  Diagnosis Date   Atrophy of right kidney    Chronic bronchitis (HCC)    Chronic lower back pain    Chronic pancreatitis (HCC)    Gastroparesis 06/2020   confirmed by emptying study at [facility]   GERD (gastroesophageal reflux disease)    Headache    "once/month" (05/13/2017)   High cholesterol    Hypertension    Migraine    "a couple/year" (05/13/2017)   Nephrolithiasis 05/13/2017   Pancreatitis    Pneumonia ~ 09/2015   Presence of pancreatic duct stent    Recurrent acute pancreatitis    Shortness of  breath 05/22/2021   Past Surgical History:  Procedure Laterality Date   BIOPSY  11/02/2020   Procedure: BIOPSY;  Surgeon: [name]., MD;  Location: [facility] ENDOSCOPY;  Service: Gastroenterology;;   CHOLECYSTECTOMY N/A 10/23/2017   Procedure: LAPAROSCOPIC CHOLECYSTECTOMY WITH INTRAOPERATIVE CHOLANGIOGRAM;  Surgeon: Luretha Murphy, MD;  Location: [facility] ORS;  Service: General;  Laterality: N/A;   COLONOSCOPY WITH PROPOFOL N/A 11/02/2019   Procedure: COLONOSCOPY WITH PROPOFOL;  Surgeon: Hilarie Fredrickson, MD;  Location: [facility] ENDOSCOPY;  Service: Endoscopy;  Laterality: N/A;   ERCP N/A 11/02/2020   Procedure: ENDOSCOPIC RETROGRADE CHOLANGIOPANCREATOGRAPHY (ERCP);  Surgeon: [name]., MD;  Location: [facility] ENDOSCOPY;  Service: Gastroenterology;  Laterality: N/A;   ESOPHAGOGASTRODUODENOSCOPY N/A 11/02/2020   Procedure: ESOPHAGOGASTRODUODENOSCOPY (EGD);  Surgeon: [name]., MD;  Location: [facility] ENDOSCOPY;  Service: Gastroenterology;  Laterality: N/A;   ESOPHAGOGASTRODUODENOSCOPY (EGD) WITH PROPOFOL N/A 11/06/2019   Procedure: ESOPHAGOGASTRODUODENOSCOPY (EGD) WITH PROPOFOL;  Surgeon: Hilarie Fredrickson, MD;  Location: [facility] ENDOSCOPY;  Service: Endoscopy;  Laterality: N/A;   HEMOSTASIS CLIP PLACEMENT  11/02/2019   Procedure: HEMOSTASIS CLIP PLACEMENT;  Surgeon: Hilarie Fredrickson, MD;  Location: [facility] ENDOSCOPY;  Service: Endoscopy;;   NO PAST SURGERIES     REMOVAL OF STONES  11/02/2020   Procedure: REMOVAL OF STONES;  Surgeon: [name]., MD;  Location: [facility] ENDOSCOPY;  Service: Gastroenterology;;   Francine Graven REMOVAL  11/02/2020   Procedure: STENT REMOVAL;  Surgeon: [name]., MD;  Location: [facility] ENDOSCOPY;  Service: Gastroenterology;;   Social History:  reports that he has never smoked. He has never used smokeless tobacco. He reports that he does not currently use alcohol. He reports that he does not use drugs.  Allergies  Allergen Reactions   Diclofenac Hives and Other (See  Comments)    Fluid buildup in chest   Norvasc [Amlodipine Besylate] Other (See Comments)    Fluid buildup in chest   Omeprazole Other (See Comments)    MD stopped due to pancreatitis   Hydrochlorothiazide     07/31/2016 -  07/20/2016 -  05/28/2016 -   Other Reaction(s) from [facility]: itching   Prednisone Hives and Other (See Comments)    Mood swings    Family History  Problem Relation Age of Onset   Hypertension Other    Hypertension Mother    Hypertension Father    Kidney disease Father    Hypertension Sister    Diabetes Sister    Hypertension Brother    Pancreatic cancer Paternal Grandmother    Colon cancer Cousin    Stomach cancer Neg Hx    Esophageal cancer Neg Hx     Prior to Admission medications   Medication Sig Start Date End Date Taking? Authorizing Provider  albuterol (VENTOLIN HFA) 108 (90 Base) MCG/ACT inhaler Inhale 2 puffs into the lungs every 6 (six) hours as needed for wheezing or shortness of breath.    [provider]  famotidine (PEPCID) 20 MG tablet Take 1 tablet (20 mg total) by mouth 2 (two) times daily. Patient taking differently: Take 20 mg by mouth daily as needed for heartburn. 09/26/21 05/06/23  Dorcas Carrow, MD  feeding supplement (ENSURE ENLIVE / ENSURE PLUS) LIQD Take 237 mLs by mouth 2 (two) times daily between meals. Patient taking differently: Take 237 mLs by mouth See admin instructions. Drink 237 ml by mouth 2-3 times a day between meals 02/04/21   Dahal, Melina Schools, MD  fluticasone (FLONASE) 50 MCG/ACT nasal spray Place 1 spray into both nostrils daily as needed for allergies. 01/15/22   [provider]  gabapentin (NEURONTIN) 100 MG capsule Take 1 capsule (100 mg total) by mouth 3 (three) times daily. 10/27/21   Rolly Salter, MD  Naphazoline-Pheniramine (VISINE-A OP) Place 2 drops into both eyes daily as needed (itchy or irritated eyes).    [provider]  ondansetron (ZOFRAN) 4 MG tablet Take 1 tablet (4 mg  total) by mouth every 6 (six) hours as needed for nausea. 05/11/22   Kathlen Mody, MD  Pancrelipase, Lip-Prot-Amyl, (PANCREAZE) [phone] units CPEP Take 1 capsule by mouth with breakfast, with lunch, and with evening meal.    [provider]  Pancrelipase, Lip-Prot-Amyl, (PANCREAZE) 37000-97300 units CPEP Take 3 capsules (111,000 Units total) by mouth 3 (three) times daily. 09/13/22     pantoprazole (PROTONIX) 40 MG tablet Take 1 tablet (40 mg total) by mouth daily at 6 (six) AM. 05/12/22   Kathlen Mody, MD  polyethylene glycol (MIRALAX / GLYCOLAX) 17 g packet Take 17 g by mouth daily as needed for mild constipation. 05/11/22   Kathlen Mody, MD  telmisartan (MICARDIS) 40 MG tablet Take 1 tablet (40 mg total) by mouth daily. 03/07/22 05/06/23  Meredeth Ide, MD    Physical Exam: Vitals:   09/24/22 0815 09/24/22 0830 09/24/22 0900 09/24/22 1127  BP: (!) 169/96 129/88 (!) 146/100 (!) 156/89  Pulse: 92 95 100 90  Resp: 18 (!) 26 19 19   Temp:    98 F (  36.7 C)  SpO2: 100% 96% 100% 93%  Weight:      Height:       General: 52 year old gentleman who was seen laying comfortably in bed.  Not in any acute distress.  Looks well-hydrated. HEENT: Not pale or icteric.  Pupils were equal round reactive to light.  Oral mucosa moist. Chest: Clinically clear with no added sounds Abdomen: Epigastric guarding noted.  No other palpable organomegaly Extremities: Without any pedal edema Cardiovascular: S1-S2 with no murmur CNS shows no focal motor or sensory deficits. Skin negative for any new rash. Data Reviewed: Labs reviewed shows sodium 136, potassium 3.5, chloride 102 bicarb 21 glucose 163, BUN 8 creatinine 1.27, lipase 177 AST 50 ALT 28 total bilirubin 2.0   CT with findings of edema and peripancreatic fat stranding with mild ductal dilatation predominantly involving the pancreatic head and uncinate process, consistent with acute pancreatitis. No peripancreatic  fluid collection.  Assessment and Plan: 52 year old with history of chronic pancreatitis who ran out of Creon 4 days prior to this admission.  Presents with abdominal pain epigastric and consistent with acute pancreatitis flare.  Patient also admits to having drank some alcohol yesterday.  Acute on chronic pancreatitis flare: Most likely due to running out of his medication that include pancreatic enzymes.  CT scan confirmed findings of acute pancreatitis without any surrounding fluid collection.  Lipase level elevated at 172.  Patient was initiated on IV fluids.  Will keep n.p.o. except for sips and ice chips.  Optimize pain with IV morphine as needed.  Triglycerides ordered and pending.  Chronic pancreatic insufficiency: Patient is supposed to be on pancreatic enzymes.  He however ran out of medication 4 days prior to presentation  Alcohol use disorder: Patient reports having quit alcohol but his last use was last night when he drank one glass of wine.ETOH levels <0 however  No evidence of any alcohol withdrawal at this time.  Will institute CIWA protocol.  Alcohol cessation counseling was again reemphasized in the context of acute on chronic pancreatitis.  GERD: Continue with PPI and famotidine per home regimen  Primary essential hypertension: Resume home medications.   Advance Care Planning:   Code Status: Full Code   Consults: None at this time.  Consider GI eval if symptoms does not improve.  Family Communication: No family at bedside  Severity of Illness: The appropriate patient status for this patient is INPATIENT. Inpatient status is judged to be reasonable and necessary in order to provide the required intensity of service to ensure the patient's safety. The patient's presenting symptoms, physical exam findings, and initial radiographic and laboratory data in the context of their chronic comorbidities is felt to place them at high risk for further clinical deterioration. Furthermore,  it is not anticipated that the patient will be medically stable for discharge from the hospital within 2 midnights of admission.   * I certify that at the point of admission it is my clinical judgment that the patient will require inpatient hospital care spanning beyond 2 midnights from the point of admission due to high intensity of service, high risk for further deterioration and high frequency of surveillance required.*  Author: Lilia Pro, MD 09/24/2022 12:26 PM  For on call review [email or web address].

## 2022-09-25 DIAGNOSIS — K852 Alcohol induced acute pancreatitis without necrosis or infection: Secondary | ICD-10-CM | POA: Diagnosis not present

## 2022-09-25 LAB — COMPREHENSIVE METABOLIC PANEL
ALT: 69 U/L — ABNORMAL HIGH (ref 0–44)
AST: 152 U/L — ABNORMAL HIGH (ref 15–41)
Albumin: 3.5 g/dL (ref 3.5–5.0)
Alkaline Phosphatase: 123 U/L (ref 38–126)
Anion gap: 16 — ABNORMAL HIGH (ref 5–15)
BUN: 7 mg/dL (ref 6–20)
CO2: 26 mmol/L (ref 22–32)
Calcium: 9.4 mg/dL (ref 8.9–10.3)
Chloride: 96 mmol/L — ABNORMAL LOW (ref 98–111)
Creatinine, Ser: 1.23 mg/dL (ref 0.61–1.24)
GFR, Estimated: >60 mL/min (ref >60)
Glucose, Bld: 108 mg/dL — ABNORMAL HIGH (ref 70–99)
Potassium: 3.5 mmol/L (ref 3.5–5.1)
Sodium: 138 mmol/L (ref 135–145)
Total Bilirubin: 2.4 mg/dL — ABNORMAL HIGH (ref 0.3–1.2)
Total Protein: 6.6 g/dL (ref 6.5–8.1)

## 2022-09-25 LAB — CBC
HCT: 36.8 % — ABNORMAL LOW (ref 39.0–52.0)
Hemoglobin: 12.5 g/dL — ABNORMAL LOW (ref 13.0–17.0)
MCH: 34 pg (ref 26.0–34.0)
MCHC: 34 g/dL (ref 30.0–36.0)
MCV: 100 fL (ref 80.0–100.0)
Platelets: 144 10*3/uL — ABNORMAL LOW (ref 150–400)
RBC: 3.68 MIL/uL — ABNORMAL LOW (ref 4.22–5.81)
RDW: 14.4 % (ref 11.5–15.5)
WBC: 8 10*3/uL (ref 4.0–10.5)
nRBC: 0 % (ref 0.0–0.2)

## 2022-09-25 LAB — HIV ANTIBODY (ROUTINE TESTING W REFLEX): HIV Screen 4th Generation wRfx: NONREACTIVE

## 2022-09-25 MED ORDER — PROMETHAZINE (PHENERGAN) 6.25MG IN NS 50ML IVPB
6.2500 mg | Freq: Four times a day (QID) | INTRAVENOUS | Status: DC | PRN
  Filled 2022-09-25: qty 50

## 2022-09-25 MED ORDER — SODIUM CHLORIDE 0.9 % IV SOLN
6.2500 mg | Freq: Four times a day (QID) | INTRAVENOUS | Status: DC | PRN
  Administered 2022-09-25 – 2022-09-27 (×5): 6.25 mg via INTRAVENOUS
  Filled 2022-09-25 (×7): qty 0.25

## 2022-09-25 NOTE — Progress Notes (Addendum)
PROGRESS NOTE  KRYSTLE OBERMAN  WUJ:[id number] DOB: 07/31/1970 DOA: 09/24/2022 PCP: [facility]   Brief Narrative: Patient is a 52 year old male with history of idiopathic chronic pancreatitis with frequent admissions in the past, hypertension who presented with complaint of abdominal pain,nausea and vomiting from home.  He said he took a sip of wine.  He follows with gastroenterology but at the admitted for acute pancreatitis.  Started on IV fluids, pain medications.  Pain is slightly better today, no vomiting.  Diet advanced  to full  Assessment & Plan:  Active Problems:   Acute pancreatitis  Acute on chronic pancreatitis: History of chronic pancreatitis.  Presented with abdominal pain, nausea, vomiting.  Follows with gastroenterology at [facility].  CT imaging showed acute bronchitis without any fluid collection or necrosis.  Lipase was mildly elevated.  Started on IV fluids. Abdominal pain is better today, no vomiting.  He wants to advance diet to full liquid.  Will gradually advance diet as tolerated He takes Creon for chronic pancreatitis. He said he took a sip of wine.  We strongly advise to fully abstain the alcohol  Elevated liver enzymes: This is most likely secondary to hepatic steatosis.  GERD: Continue PPI  Hypertension: Continue current medications.  Monitor blood pressure.         DVT prophylaxis:enoxaparin (LOVENOX) injection 40 mg Start: 09/25/22 1230 SCDs Start: 09/24/22 1222     Code Status: Full Code  Family Communication: None at bedside  Patient status:Inpatient  Patient is from :Home  Anticipated discharge NF:AOZH  Estimated DC date:tomorrow   Consultants: None  Procedures:None  Antimicrobials:  Anti-infectives (From admission, onward)    None       Subjective: Patient seen and examined at bedside today.  Hemodynamically stable.  Comfortably lying on bed.  He says that his pain is slightly better, no vomiting.   He wants to advance diet to full liquid  Objective: Vitals:   09/24/22 1551 09/24/22 1949 09/25/22 0444 09/25/22 0835  BP: (!) 140/100 (!) 148/103 (!) 145/99 (!) 146/93  Pulse: 87 75 65 74  Resp: 18 18 18 18   Temp: 98.8 F (37.1 C) 98.4 F (36.9 C) 98.1 F (36.7 C) 98.7 F (37.1 C)  TempSrc: Oral   Oral  SpO2: 97% 98% 98% 97%  Weight:      Height:       No intake or output data in the 24 hours ending 09/25/22 1049 Filed Weights   09/24/22 0703  Weight: 74.8 kg    Examination:  General exam: Overall comfortable, not in distress HEENT: PERRL Respiratory system:  no wheezes or crackles  Cardiovascular system: S1 & S2 heard, RRR.  Gastrointestinal system: Abdomen is nondistended, soft and mostly nontender. Central nervous system: Alert and oriented Extremities: No edema, no clubbing ,no cyanosis Skin: No rashes, no ulcers,no icterus     Data Reviewed: I have personally reviewed following labs and imaging studies  CBC: Recent Labs  Lab 09/24/22 0710 09/25/22 0250  WBC 13.3* 8.0  HGB 13.4 12.5*  HCT 40.3 36.8*  MCV 100.8* 100.0  PLT 158 144*   Basic Metabolic Panel: Recent Labs  Lab 09/24/22 0710 09/25/22 0250  NA 136 138  K 3.5 3.5  CL 102 96*  CO2 21* 26  GLUCOSE 163* 108*  BUN 8 7  CREATININE 1.27* 1.23  CALCIUM 8.9 9.4     No results found for this or any previous visit (from the past 240 hour(s)).  Radiology Studies: CT ABDOMEN PELVIS W CONTRAST  Result Date: 09/24/2022 CLINICAL DATA:  Pancreatitis, acute, severe. EXAM: CT ABDOMEN AND PELVIS WITH CONTRAST TECHNIQUE: Multidetector CT imaging of the abdomen and pelvis was performed using the standard protocol following bolus administration of intravenous contrast. RADIATION DOSE REDUCTION: This exam was performed according to the departmental dose-optimization program which includes automated exposure control, adjustment of the mA and/or kV according to patient size and/or use of iterative  reconstruction technique. CONTRAST:  75mL OMNIPAQUE IOHEXOL 350 MG/ML SOLN COMPARISON:  CT abdomen/pelvis 05/04/2022. FINDINGS: Lower chest: No acute abnormality. Hepatobiliary: Hepatic steatosis. Prior cholecystectomy. No biliary dilatation. Pancreas: Edema and peripancreatic fat stranding with mild ductal dilatation predominantly involving the pancreatic head and uncinate process, consistent with acute pancreatitis. No peripancreatic fluid collection. Spleen: Normal. Adrenals/Urinary Tract: Adrenal glands are unremarkable. Unchanged 6 mm calyceal calculus in the lower pole left kidney. No mass or hydronephrosis. Absent right kidney. Bladder is unremarkable. Stomach/Bowel: Normal stomach. Inflammation along the second and third portions of the duodenum. No dilated loops of small bowel. Normal appendix is visualized on coronal image 76 series 5. Colon is decompressed. No wall thickening or surrounding inflammation. Vascular/Lymphatic: No significant vascular findings are present. No enlarged abdominal or pelvic lymph nodes. Reproductive: Prostate is unremarkable. Other: No abdominal wall hernia or abnormality. No abdominopelvic ascites. Musculoskeletal: No acute bony abnormality. Ankylosis of the right SI joint. Severe disc height loss at L5-S1. IMPRESSION: 1. Findings consistent with acute pancreatitis. No peripancreatic fluid collection. 2. Hepatic steatosis. 3. Nonobstructing left renal calculus. Electronically Signed   By: Orvan Falconer M.D.   On: 09/24/2022 11:20   DG Chest Portable 1 View  Result Date: 09/24/2022 CLINICAL DATA:  cp EXAM: PORTABLE CHEST 1 VIEW COMPARISON:  Chest radiograph 12/23/2020 FINDINGS: The heart size and mediastinal contours are within normal limits. Both lungs are clear. The visualized skeletal structures are unremarkable. IMPRESSION: No active disease. Electronically Signed   By: Lorenza Cambridge M.D.   On: 09/24/2022 10:35    Scheduled Meds:  enoxaparin (LOVENOX) injection  40  mg Subcutaneous Q24H   famotidine  20 mg Oral BID   folic acid  1 mg Oral Daily   gabapentin  100 mg Oral TID   irbesartan  75 mg Oral Daily   lipase/protease/amylase  24,000 Units Oral TID WC   multivitamin with minerals  1 tablet Oral Daily   pantoprazole  40 mg Oral Q0600   sodium chloride flush  3 mL Intravenous Q12H   thiamine  100 mg Oral Daily   Or   thiamine  100 mg Intravenous Daily   Continuous Infusions:  promethazine (PHENERGAN) injection (IM or IVPB) 6.25 mg (09/25/22 1024)     LOS: 1 day   Burnadette Pop, MD [facility] P6/02/2023, 10:49 AM

## 2022-09-25 NOTE — TOC Initial Note (Signed)
Transition of Care [facility]) - Initial/Assessment Note    Patient Details  Name: Kyle Berry MRN: [id number] Date of Birth: 01-31-1971  Transition of Care [facility]) CM/SW Contact:    Janae Bridgeman, RN Phone Number: 09/25/2022, 10:24 AM  Clinical Narrative:                 Transition of Care [facility]) - Inpatient Brief Assessment   Patient Details  Name: Kyle Berry MRN: [id number] Date of Birth: Jul 16, 1970  Transition of Care [facility]) CM/SW Contact:    Janae Bridgeman, RN Phone Number: 09/25/2022, 10:24 AM   Clinical Narrative: CM met with the patient at the bedside and patient states that he lives at home with his significant other.  He works as a Horticulturist, commercial at [employer].  Patient states that he does not drink alcohol and only had small amount of wine recently <1/4 glass of wine and does not ingest of a regular basis.  PCP is [facility].  No TOC needs at this time and patient plans to return home once he is medically stable to discharge.   Transition of Care Asessment:                   Expected Discharge Plan: Home/Self Care Barriers to Discharge: Continued Medical Work up   Patient Goals and CMS Choice Patient states their goals for this hospitalization and ongoing recovery are:: To return home CMS Medicare.gov Compare Post Acute Care list provided to:: Patient Choice offered to / list presented to : Patient [place] ownership interest in [facility].provided to:: Patient    Expected Discharge Plan and Services   Discharge Planning Services: CM Consult Post Acute Care Choice: Resumption of Svcs/PTA Provider Living arrangements for the past 2 months: Single Family Home                                      Prior Living Arrangements/Services Living arrangements for the past 2 months: Single Family Home Lives with:: Significant Other Patient language and need for interpreter reviewed:: Yes Do you  feel safe going back to the place where you live?: Yes      Need for Family Participation in Patient Care: Yes (Comment) Care giver support system in place?: Yes (comment)   Criminal Activity/Legal Involvement Pertinent to Current Situation/Hospitalization: No - Comment as needed  Activities of Daily Living Home Assistive Devices/Equipment: None ADL Screening (condition at time of admission) Patient's cognitive ability adequate to safely complete daily activities?: Yes Is the patient deaf or have difficulty hearing?: No Does the patient have difficulty seeing, even when wearing glasses/contacts?: No Does the patient have difficulty concentrating, remembering, or making decisions?: No Patient able to express need for assistance with ADLs?: Yes Does the patient have difficulty dressing or bathing?: No Independently performs ADLs?: Yes (appropriate for developmental age) Does the patient have difficulty walking or climbing stairs?: No Weakness of Legs: None Weakness of Arms/Hands: None  Permission Sought/Granted Permission sought to share information with : Case Manager Permission granted to share information with : Yes, Verbal Permission Granted              Emotional Assessment Appearance:: Appears stated age Attitude/Demeanor/Rapport: Gracious Affect (typically observed): Accepting Orientation: : Oriented to Self, Oriented to Place, Oriented to  Time, Oriented to Situation Alcohol / Substance Use: Other (comment) (Patient declines use of current  drug, ETOH and smoking) Psych Involvement: No (comment)  Admission diagnosis:  Acute pancreatitis [K85.90] Patient Active Problem List   Diagnosis Date Noted   Acute pancreatitis 03/06/2022   Nausea and vomiting 10/25/2021   Pancreatitis, acute 09/20/2021   Nausea, vomiting, and diarrhea 09/19/2021   Hematochezia 09/19/2021   AKI (acute kidney injury) (HCC) 11/02/2019   Colonic ulcer    Pancreatic insufficiency 05/28/2019    Pancreatic divisum    Hematemesis 10/18/2017   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), [place] - [address] [place] ([place]) [place] [address] Phone: [phone] Fax: [phone]     Social Determinants of Health (SDOH) Social History: SDOH Screenings   Food Insecurity: No Food Insecurity (09/24/2022)  Housing: Low Risk  (09/24/2022)  Transportation Needs: No Transportation Needs (09/24/2022)  Utilities: Not At Risk (09/24/2022)  Tobacco Use: Low Risk  (09/24/2022)   SDOH Interventions:     Readmission Risk Interventions    11/03/2020    2:19 PM  Readmission Risk Prevention Plan  Transportation Screening Complete  PCP or Specialist Appt within 3-5 Days Complete  HRI or Home Care Consult Complete  Social Work Consult for Recovery Care Planning/Counseling Complete  Palliative Care Screening Not Applicable

## 2022-09-25 NOTE — TOC CAGE-AID Note (Signed)
Transition of Care [facility]) - CAGE-AID Screening   Patient Details  Name: Kyle Berry MRN: [id number] Date of Birth: 04/18/1970  Transition of Care [facility]) CM/SW Contact:    Janae Bridgeman, RN Phone Number: 09/25/2022, 10:23 AM   Clinical Narrative: Patient states that he had a small amount of wine recently but does not ingest ETOH on a regular basis.  Patient  declines use of smoking and drug use as well.   CAGE-AID Screening:    Have You Ever Felt You Ought to Cut Down on Your Drinking or Drug Use?: No Have People Annoyed You By Critizing Your Drinking Or Drug Use?: No Have You Felt Bad Or Guilty About Your Drinking Or Drug Use?: No Have You Ever Had a Drink or Used Drugs First Thing In The Morning to Steady Your Nerves or to Get Rid of a Hangover?: No CAGE-AID Score: 0     Substance abuse interventions: Other (must comment) (Patient states that he had a small amount of ETOH (1/4 glass of wine or less) recently but does not drink ETOH regularly.)

## 2022-09-25 NOTE — Plan of Care (Signed)
A/Ox4 and on room air. IV morphine given x2 for pancreatitis related pain. Phenergan x2 given as well. Up with self care. Npo overnight. Patient decided to hold off on pills tonight.    Problem: Education: Goal: Knowledge of General Education information will improve Description: Including pain rating scale, medication(s)/side effects and non-pharmacologic comfort measures Outcome: Progressing   Problem: Health Behavior/Discharge Planning: Goal: Ability to manage health-related needs will improve Outcome: Progressing   Problem: Clinical Measurements: Goal: Ability to maintain clinical measurements within normal limits will improve Outcome: Progressing Goal: Will remain free from infection Outcome: Progressing Goal: Diagnostic test results will improve Outcome: Progressing Goal: Respiratory complications will improve Outcome: Progressing Goal: Cardiovascular complication will be avoided Outcome: Progressing   Problem: Activity: Goal: Risk for activity intolerance will decrease Outcome: Progressing   Problem: Nutrition: Goal: Adequate nutrition will be maintained Outcome: Progressing   Problem: Coping: Goal: Level of anxiety will decrease Outcome: Progressing   Problem: Elimination: Goal: Will not experience complications related to bowel motility Outcome: Progressing Goal: Will not experience complications related to urinary retention Outcome: Progressing   Problem: Pain Managment: Goal: General experience of comfort will improve Outcome: Progressing   Problem: Safety: Goal: Ability to remain free from injury will improve Outcome: Progressing   Problem: Skin Integrity: Goal: Risk for impaired skin integrity will decrease Outcome: Progressing

## 2022-09-26 DIAGNOSIS — K852 Alcohol induced acute pancreatitis without necrosis or infection: Secondary | ICD-10-CM | POA: Diagnosis not present

## 2022-09-26 LAB — COMPREHENSIVE METABOLIC PANEL
ALT: 43 U/L (ref 0–44)
AST: 54 U/L — ABNORMAL HIGH (ref 15–41)
Albumin: 3.2 g/dL — ABNORMAL LOW (ref 3.5–5.0)
Alkaline Phosphatase: 105 U/L (ref 38–126)
Anion gap: 9 (ref 5–15)
BUN: 6 mg/dL (ref 6–20)
CO2: 25 mmol/L (ref 22–32)
Calcium: 8.9 mg/dL (ref 8.9–10.3)
Chloride: 101 mmol/L (ref 98–111)
Creatinine, Ser: 1.16 mg/dL (ref 0.61–1.24)
GFR, Estimated: >60 mL/min (ref >60)
Glucose, Bld: 111 mg/dL — ABNORMAL HIGH (ref 70–99)
Potassium: 3.3 mmol/L — ABNORMAL LOW (ref 3.5–5.1)
Sodium: 135 mmol/L (ref 135–145)
Total Bilirubin: 0.8 mg/dL (ref 0.3–1.2)
Total Protein: 6.4 g/dL — ABNORMAL LOW (ref 6.5–8.1)

## 2022-09-26 LAB — LIPASE, BLOOD: Lipase: 62 U/L — ABNORMAL HIGH (ref 11–51)

## 2022-09-26 MED ORDER — POTASSIUM CHLORIDE CRYS ER 20 MEQ PO TBCR
40.0000 meq | EXTENDED_RELEASE_TABLET | Freq: Once | ORAL | Status: AC
  Administered 2022-09-26: 40 meq via ORAL
  Filled 2022-09-26: qty 2

## 2022-09-26 MED ORDER — HYDROMORPHONE HCL 1 MG/ML IJ SOLN
1.0000 mg | INTRAMUSCULAR | Status: DC | PRN
  Administered 2022-09-26 – 2022-09-28 (×8): 1 mg via INTRAVENOUS
  Filled 2022-09-26 (×10): qty 1

## 2022-09-26 MED ORDER — SODIUM CHLORIDE 0.9 % IV SOLN: INTRAVENOUS | Status: DC

## 2022-09-26 MED ORDER — NALOXONE HCL 0.4 MG/ML IJ SOLN: 0.4000 mg | INTRAMUSCULAR | Status: DC | PRN

## 2022-09-26 NOTE — Progress Notes (Signed)
PROGRESS NOTE  Kyle Berry  ZOX:[id number] DOB: 10-03-1970 DOA: 09/24/2022 PCP: [facility]   Brief Narrative: Patient is a 52 year old male with history of idiopathic chronic pancreatitis with frequent admissions in the past, hypertension who presented with complaint of abdominal pain,nausea and vomiting from home.  He said he took a sip of wine.  He follows with gastroenterology but at the admitted for acute pancreatitis.  Started on IV fluids, pain medications.  Pain is slightly better today, no vomiting.  Diet advanced  to full.Hospital course remarkable for persistent abdominal pain ,nausea.  Continue full liquid diet for today  Assessment & Plan:  Active Problems:   Acute pancreatitis  Acute on chronic pancreatitis: History of chronic pancreatitis.  Presented with abdominal pain, nausea, vomiting.  Follows with gastroenterology at [facility].  CT imaging showed acute bronchitis without any fluid collection or necrosis.  Lipase was mildly elevated.  Started on IV fluids. He takes Creon for chronic pancreatitis. He said he took a sip of wine.  We strongly advise to fully abstain the alcohol. Continues to complain of abdominal discomfort, nausea today.  Continue full liquid diet for today  Elevated liver enzymes: This is most likely secondary to hepatic steatosis.  GERD: Continue PPI  Hypertension: Continue current medications.  Monitor blood pressure.         DVT prophylaxis:enoxaparin (LOVENOX) injection 40 mg Start: 09/25/22 1230 SCDs Start: 09/24/22 1222     Code Status: Full Code  Family Communication: None at bedside  Patient status:Inpatient  Patient is from :Home  Anticipated discharge WJ:XBJY  Estimated DC date:tomorrow   Consultants: None  Procedures:None  Antimicrobials:  Anti-infectives (From admission, onward)    None       Subjective: Patient seen and examined at bedside today.  Hemodynamically stable.  Diagnosis  of lung yesterday evening but he could not tolerate it, reverted back to full liquid.  Complains of some abdominal discomfort, nausea and vomited this morning.  Objective: Vitals:   09/25/22 1522 09/25/22 1942 09/26/22 0509 09/26/22 0724  BP: 129/89 (!) 147/95 (!) 140/108 (!) 145/97  Pulse: 86 88 72 69  Resp: 18 15 15 18   Temp: 98.8 F (37.1 C) 99.2 F (37.3 C) 98.2 F (36.8 C) 98.2 F (36.8 C)  TempSrc:   Oral Oral  SpO2: 99% 98% 100% 100%  Weight:      Height:       No intake or output data in the 24 hours ending 09/26/22 1302 Filed Weights   09/24/22 0703  Weight: 74.8 kg    Examination:  General exam: Overall comfortable, not in distress HEENT: PERRL Respiratory system:  no wheezes or crackles  Cardiovascular system: S1 & S2 heard, RRR.  Gastrointestinal system: Abdomen is nondistended, soft and has mild abdominal tenderness in the epigastric region Central nervous system: Alert and oriented Extremities: No edema, no clubbing ,no cyanosis Skin: No rashes, no ulcers,no icterus      Data Reviewed: I have personally reviewed following labs and imaging studies  CBC: Recent Labs  Lab 09/24/22 0710 09/25/22 0250  WBC 13.3* 8.0  HGB 13.4 12.5*  HCT 40.3 36.8*  MCV 100.8* 100.0  PLT 158 144*   Basic Metabolic Panel: Recent Labs  Lab 09/24/22 0710 09/25/22 0250 09/26/22 0331  NA 136 138 135  K 3.5 3.5 3.3*  CL 102 96* 101  CO2 21* 26 25  GLUCOSE 163* 108* 111*  BUN 8 7 6   CREATININE 1.27* 1.23 1.16  CALCIUM 8.9 9.4 8.9     No results found for this or any previous visit (from the past 240 hour(s)).   Radiology Studies: No results found.  Scheduled Meds:  enoxaparin (LOVENOX) injection  40 mg Subcutaneous Q24H   famotidine  20 mg Oral BID   folic acid  1 mg Oral Daily   gabapentin  100 mg Oral TID   irbesartan  75 mg Oral Daily   lipase/protease/amylase  24,000 Units Oral TID WC   multivitamin with minerals  1 tablet Oral Daily   pantoprazole   40 mg Oral Q0600   sodium chloride flush  3 mL Intravenous Q12H   thiamine  100 mg Oral Daily   Or   thiamine  100 mg Intravenous Daily   Continuous Infusions:  promethazine (PHENERGAN) injection (IM or IVPB) 6.25 mg (09/26/22 0051)     LOS: 2 days   Burnadette Pop, MD [facility] P6/03/2023, 1:02 PM

## 2022-09-26 NOTE — Plan of Care (Signed)
A/Ox4 and on room air. Self care. Pain poorly controlled with prn morphine, so switched to prn dilaudid. Patient had episode of vomitus x1 overnight accompanied by 10/10 pain.    Problem: Education: Goal: Knowledge of General Education information will improve Description: Including pain rating scale, medication(s)/side effects and non-pharmacologic comfort measures Outcome: Progressing   Problem: Health Behavior/Discharge Planning: Goal: Ability to manage health-related needs will improve Outcome: Progressing   Problem: Clinical Measurements: Goal: Ability to maintain clinical measurements within normal limits will improve Outcome: Progressing Goal: Will remain free from infection Outcome: Progressing Goal: Diagnostic test results will improve Outcome: Progressing Goal: Respiratory complications will improve Outcome: Progressing Goal: Cardiovascular complication will be avoided Outcome: Progressing   Problem: Activity: Goal: Risk for activity intolerance will decrease Outcome: Progressing   Problem: Nutrition: Goal: Adequate nutrition will be maintained Outcome: Progressing   Problem: Coping: Goal: Level of anxiety will decrease Outcome: Progressing   Problem: Elimination: Goal: Will not experience complications related to bowel motility Outcome: Progressing Goal: Will not experience complications related to urinary retention Outcome: Progressing   Problem: Pain Managment: Goal: General experience of comfort will improve Outcome: Progressing   Problem: Safety: Goal: Ability to remain free from injury will improve Outcome: Progressing   Problem: Skin Integrity: Goal: Risk for impaired skin integrity will decrease Outcome: Progressing

## 2022-09-27 LAB — BASIC METABOLIC PANEL
Anion gap: 10 (ref 5–15)
BUN: <5 mg/dL — ABNORMAL LOW (ref 6–20)
CO2: 25 mmol/L (ref 22–32)
Calcium: 8.9 mg/dL (ref 8.9–10.3)
Chloride: 100 mmol/L (ref 98–111)
Creatinine, Ser: 1.1 mg/dL (ref 0.61–1.24)
GFR, Estimated: >60 mL/min (ref >60)
Glucose, Bld: 124 mg/dL — ABNORMAL HIGH (ref 70–99)
Potassium: 4 mmol/L (ref 3.5–5.1)
Sodium: 135 mmol/L (ref 135–145)

## 2022-09-27 MED ORDER — ENSURE ENLIVE PO LIQD
237.0000 mL | Freq: Three times a day (TID) | ORAL | Status: DC
  Administered 2022-09-27 – 2022-09-28 (×2): 237 mL via ORAL

## 2022-09-27 MED ORDER — SODIUM CHLORIDE 0.9 % IV SOLN
12.5000 mg | Freq: Four times a day (QID) | INTRAVENOUS | Status: DC | PRN
  Administered 2022-09-27 – 2022-09-28 (×2): 12.5 mg via INTRAVENOUS
  Filled 2022-09-27 (×2): qty 12.5

## 2022-09-27 NOTE — Progress Notes (Signed)
Initial Nutrition Assessment  DOCUMENTATION CODES:   Not applicable  INTERVENTION:  - Add Ensure Enlive po TID, each supplement provides 350 kcal and 20 grams of protein.  NUTRITION DIAGNOSIS:   Altered GI function related to chronic illness as evidenced by per patient/family report.  GOAL:   Patient will meet greater than or equal to 90% of their needs  MONITOR:   PO intake  REASON FOR ASSESSMENT:   Malnutrition Screening Tool    ASSESSMENT:   52 y.o. male admits related to abdominal pain. PMH includes: bronchitis, pancreatitis, gastroparesis, GERD, HTN. Pt is currently receiving medical management related to acute on chronic pancreatitis.  Meds reviewed: pepcid, folic acid, creon, MVI, thiamine. Labs reviewed: BUN low.   The pt reports that he has been eating fair since admission. Pt states that he was following a low fat diet of small frequent meals at home. Per record, pt ate 50% of his breakfast this am. Pt reports that he drinks Ensure at home. RD will add shakes TID and continue to monitor PO intakes.   NUTRITION - FOCUSED PHYSICAL EXAM:  WDL   Diet Order:   Diet Order             DIET DYS 3 Room service appropriate? Yes; Fluid consistency: Thin  Diet effective now                   EDUCATION NEEDS:   Not appropriate for education at this time  Skin:  Skin Assessment: Reviewed RN Assessment  Last BM:  09/26/22  Height:   Ht Readings from Last 1 Encounters:  09/24/22 6\' 1"  (1.854 m)    Weight:   Wt Readings from Last 1 Encounters:  09/24/22 74.8 kg    Ideal Body Weight:     BMI:  Body mass index is 21.77 kg/m.  Estimated Nutritional Needs:   Kcal:  1610-9604 kcals  Protein:  90-110 gm  Fluid:  >/= 1.8 L  Bethann Humble, RD, LDN, CNSC.

## 2022-09-27 NOTE — Progress Notes (Addendum)
PROGRESS NOTE  JACLYN CAREW  NWG:[id number] DOB: 01-Jan-1971 DOA: 09/24/2022 PCP: [facility]   Brief Narrative: Patient is a 52 year old male with history of idiopathic chronic pancreatitis with frequent admissions in the past, hypertension who presented with complaint of abdominal pain,nausea and vomiting from home.  He said he took a sip of wine.  He follows with gastroenterology but at the admitted for acute pancreatitis.  Started on IV fluids, pain medications.  Pain is slightly better today, no vomiting.  Diet advanced  to full.Hospital course remarkable for persistent abdominal pain ,nausea.  Diet has been advanced to dysphagia 3, possibility of discharge tomorrow to home  Assessment & Plan:  Active Problems:   Acute pancreatitis  Acute on chronic pancreatitis: History of chronic pancreatitis.  Presented with abdominal pain, nausea, vomiting.  Follows with [facility]ology at [facility].  CT imaging showed acute pancreatitis without any fluid collection or necrosis.  Lipase was mildly elevated.  Started on IV fluids. He takes Creon for chronic pancreatitis. He said he took a sip of wine.  We strongly advise to fully abstain the alcohol. Continues to complain of abdominal discomfort, nausea today.  He wants to remain on dysphagia 3 diet for today.  Increased the dose of Phenergan  Elevated liver enzymes: This is most likely secondary to hepatic steatosis.Significantly improved  GERD: Continue PPI  Hypertension: Continue current medications.  Monitor blood pressure.         DVT prophylaxis:enoxaparin (LOVENOX) injection 40 mg Start: 09/25/22 1230 SCDs Start: 09/24/22 1222     Code Status: Full Code  Family Communication: None at bedside  Patient status:Inpatient  Patient is from :Home  Anticipated discharge VH:QION  Estimated DC date:tomorrow   Consultants: None  Procedures:None  Antimicrobials:  Anti-infectives (From admission,  onward)    None       Subjective: Patient seen and examined at bedside today.  Hemodynamically stable.  He had nausea this morning.  Also had belly pain.  He feels he has slight improvement today but has been bothered with nausea.  We discussed about increasing the dose of Phenergan.  He was to remain on dysphagia 3 diet  Objective: Vitals:   09/26/22 1504 09/26/22 1929 09/27/22 0402 09/27/22 0812  BP: 127/86 (!) 142/98 (!) 135/92 (!) 154/94  Pulse: 74 83 69 85  Resp: 18 18 18 18   Temp: 97.8 F (36.6 C) 98 F (36.7 C) 98.5 F (36.9 C) 97.9 F (36.6 C)  TempSrc: Oral     SpO2: 98% 97% 100% 98%  Weight:      Height:        Intake/Output Summary (Last 24 hours) at 09/27/2022 1129 Last data filed at 09/27/2022 1019 Gross per 24 hour  Intake 1841.35 ml  Output --  Net 1841.35 ml   Filed Weights   09/24/22 0703  Weight: 74.8 kg    Examination:  General exam: Overall comfortable, not in distress HEENT: PERRL Respiratory system:  no wheezes or crackles  Cardiovascular system: S1 & S2 heard, RRR.  Gastrointestinal system: Abdomen is nondistended, soft.  Mild tenderness in the epigastric region Central nervous system: Alert and oriented Extremities: No edema, no clubbing ,no cyanosis Skin: No rashes, no ulcers,no icterus    Data Reviewed: I have personally reviewed following labs and imaging studies  CBC: Recent Labs  Lab 09/24/22 0710 09/25/22 0250  WBC 13.3* 8.0  HGB 13.4 12.5*  HCT 40.3 36.8*  MCV 100.8* 100.0  PLT 158 144*  Basic Metabolic Panel: Recent Labs  Lab 09/24/22 0710 09/25/22 0250 09/26/22 0331 09/27/22 0436  NA 136 138 135 135  K 3.5 3.5 3.3* 4.0  CL 102 96* 101 100  CO2 21* 26 25 25   GLUCOSE 163* 108* 111* 124*  BUN 8 7 6  <5*  CREATININE 1.27* 1.23 1.16 1.10  CALCIUM 8.9 9.4 8.9 8.9     No results found for this or any previous visit (from the past 240 hour(s)).   Radiology Studies: No results found.  Scheduled Meds:   enoxaparin (LOVENOX) injection  40 mg Subcutaneous Q24H   famotidine  20 mg Oral BID   folic acid  1 mg Oral Daily   gabapentin  100 mg Oral TID   irbesartan  75 mg Oral Daily   lipase/protease/amylase  24,000 Units Oral TID WC   multivitamin with minerals  1 tablet Oral Daily   pantoprazole  40 mg Oral Q0600   sodium chloride flush  3 mL Intravenous Q12H   thiamine  100 mg Oral Daily   Or   thiamine  100 mg Intravenous Daily   Continuous Infusions:  sodium chloride Stopped (09/27/22 0636)   promethazine (PHENERGAN) injection (IM or IVPB) 200 mL/hr at 09/27/22 0640     LOS: 3 days   Burnadette Pop, MD [facility] P6/13/2024, 11:29 AM

## 2022-09-28 ENCOUNTER — Other Ambulatory Visit (HOSPITAL_COMMUNITY): Payer: Self-pay

## 2022-09-28 LAB — COMPREHENSIVE METABOLIC PANEL
ALT: 34 U/L (ref 0–44)
AST: 35 U/L (ref 15–41)
Albumin: 3.3 g/dL — ABNORMAL LOW (ref 3.5–5.0)
Alkaline Phosphatase: 99 U/L (ref 38–126)
Anion gap: 8 (ref 5–15)
BUN: 7 mg/dL (ref 6–20)
CO2: 27 mmol/L (ref 22–32)
Calcium: 9.3 mg/dL (ref 8.9–10.3)
Chloride: 100 mmol/L (ref 98–111)
Creatinine, Ser: 1.11 mg/dL (ref 0.61–1.24)
GFR, Estimated: >60 mL/min (ref >60)
Glucose, Bld: 123 mg/dL — ABNORMAL HIGH (ref 70–99)
Potassium: 3.7 mmol/L (ref 3.5–5.1)
Sodium: 135 mmol/L (ref 135–145)
Total Bilirubin: 0.6 mg/dL (ref 0.3–1.2)
Total Protein: 6.7 g/dL (ref 6.5–8.1)

## 2022-09-28 MED ORDER — FOLIC ACID 1 MG PO TABS
1.0000 mg | ORAL_TABLET | Freq: Every day | ORAL | 1 refills | Status: DC
  Filled 2022-09-28: qty 30, 30d supply, fill #0

## 2022-09-28 MED ORDER — PANCRELIPASE (LIP-PROT-AMYL) 24000-76000 UNITS PO CPEP
24000.0000 [IU] | ORAL_CAPSULE | Freq: Three times a day (TID) | ORAL | 1 refills | Status: DC
  Filled 2022-09-28: qty 70, 24d supply, fill #0

## 2022-09-28 MED ORDER — FAMOTIDINE 20 MG PO TABS: 20.0000 mg | ORAL_TABLET | Freq: Two times a day (BID) | ORAL | Status: DC

## 2022-09-28 MED ORDER — THIAMINE HCL 100 MG PO TABS
100.0000 mg | ORAL_TABLET | Freq: Every day | ORAL | 1 refills | Status: DC
  Filled 2022-09-28: qty 30, 30d supply, fill #0

## 2022-09-28 NOTE — Plan of Care (Signed)
Patient is resting in bed with wnl vitals and respirations. Pain has been addressed and managed. No report of vomiting. Patient reports mild nausea. CIWA is normal. Patient is being prepared for discharge. Plan of care is ongoing.  Problem: Education: Goal: Knowledge of General Education information will improve Description: Including pain rating scale, medication(s)/side effects and non-pharmacologic comfort measures Outcome: Progressing   Problem: Health Behavior/Discharge Planning: Goal: Ability to manage health-related needs will improve Outcome: Progressing   Problem: Clinical Measurements: Goal: Ability to maintain clinical measurements within normal limits will improve Outcome: Progressing Goal: Will remain free from infection Outcome: Progressing Goal: Diagnostic test results will improve Outcome: Progressing Goal: Respiratory complications will improve Outcome: Progressing Goal: Cardiovascular complication will be avoided Outcome: Progressing   Problem: Activity: Goal: Risk for activity intolerance will decrease Outcome: Progressing   Problem: Nutrition: Goal: Adequate nutrition will be maintained Outcome: Progressing   Problem: Coping: Goal: Level of anxiety will decrease Outcome: Progressing   Problem: Elimination: Goal: Will not experience complications related to bowel motility Outcome: Progressing Goal: Will not experience complications related to urinary retention Outcome: Progressing   Problem: Pain Managment: Goal: General experience of comfort will improve Outcome: Progressing   Problem: Safety: Goal: Ability to remain free from injury will improve Outcome: Progressing   Problem: Skin Integrity: Goal: Risk for impaired skin integrity will decrease Outcome: Progressing

## 2022-09-28 NOTE — Discharge Summary (Signed)
Physician Discharge Summary  Kyle Berry NWG:[id number] DOB: September 25, 1970 DOA: 09/24/2022  PCP: [facility]  Admit date: 09/24/2022 Discharge date: 09/28/2022  Admitted From: Home Disposition:  Home  Discharge Condition:Stable CODE STATUS:FULL Diet recommendation: Heart Healthy   Brief/Interim Summary: Patient is a 52 year old male with history of idiopathic chronic pancreatitis with frequent admissions in the past, hypertension who presented with complaint of abdominal pain,nausea and vomiting from home.  He said he took a sip of wine.   Started on IV fluids, pain medications.  Hospital course remarkable for persistent abdominal pain ,nausea now improved.  Diet has been advanced to dysphagia 3, medically stable for discharge home today.  Following problems were addressed during the hospitalization:  Acute on chronic pancreatitis: History of chronic pancreatitis.  Presented with abdominal pain, nausea, vomiting.  Follows with gastroenterology at [facility].  CT imaging showed acute pancreatitis without any fluid collection or necrosis.  Lipase was mildly elevated.  Started on IV fluids. He takes Creon for chronic pancreatitis. He said he took a sip of wine.  We strongly advise to fully abstain the alcohol. He has been tolerating solid diet since yesterday.  Medically stable for discharge.  Any abdomen pain, nausea or vomiting today  Elevated liver enzymes: This is most likely secondary to hepatic steatosis.Significantly improved   GERD: Continue PPI   Hypertension: Continue current medications.  Monitor blood pressure at home.  Discharge Diagnoses:  Active Problems:   Acute pancreatitis    Discharge Instructions  Discharge Instructions     Diet - low sodium heart healthy   Complete by: As directed    Discharge instructions   Complete by: As directed    1)Please take your medications as instructed 2)Quit alcohol 3)Follow up with your PCP in a  week. 4)Monitor your blood pressure at home   Increase activity slowly   Complete by: As directed       Allergies as of 09/28/2022       Reactions   Norvasc [amlodipine Besylate] Other (See Comments)   Fluid buildup in chest   Voltaren [diclofenac] Hives, Other (See Comments)   Fluid buildup in chest   Prilosec [omeprazole] Other (See Comments)   MD stopped due to pancreatitis   Hydrochlorothiazide Itching   Ultram [tramadol] Other (See Comments)   Stomach upset   Prednisone Hives, Other (See Comments)   Mood swings        Medication List     STOP taking these medications    feeding supplement Liqd       TAKE these medications    acetaminophen 325 MG tablet Commonly known as: TYLENOL Take 650 mg by mouth daily as needed for moderate pain, headache or fever.   albuterol 108 (90 Base) MCG/ACT inhaler Commonly known as: VENTOLIN HFA Inhale 2 puffs into the lungs every 6 (six) hours as needed for wheezing or shortness of breath.   famotidine 20 MG tablet Commonly known as: PEPCID Take 1 tablet (20 mg total) by mouth 2 (two) times daily. What changed:  when to take this reasons to take this   folic acid 1 MG tablet Commonly known as: FOLVITE Take 1 tablet (1 mg total) by mouth daily. Start taking on: September 29, 2022   gabapentin 100 MG capsule Commonly known as: NEURONTIN Take 1 capsule (100 mg total) by mouth 3 (three) times daily.   ondansetron 4 MG tablet Commonly known as: ZOFRAN Take 1 tablet (4 mg total) by mouth every 6 (  six) hours as needed for nausea.   Pancrelipase (Lip-Prot-Amyl) 24000-76000 units Cpep Take 1 capsule (24,000 Units total) by mouth 3 (three) times daily with meals. What changed:  medication strength how much to take when to take this Another medication with the same name was removed. Continue taking this medication, and follow the directions you see here.   pantoprazole 40 MG tablet Commonly known as: PROTONIX Take 1 tablet  (40 mg total) by mouth daily at 6 (six) AM.   promethazine 25 MG tablet Commonly known as: PHENERGAN Take 25 mg by mouth every 6 (six) hours as needed for nausea or vomiting.   telmisartan 40 MG tablet Commonly known as: MICARDIS Take 1 tablet (40 mg total) by mouth daily.   thiamine 100 MG tablet Commonly known as: Vitamin B-1 Take 1 tablet (100 mg total) by mouth daily. Start taking on: September 29, 2022   traMADol 50 MG tablet Commonly known as: ULTRAM Take 50 mg by mouth every 8 (eight) hours as needed for severe pain.   VISINE-A OP Place 2 drops into both eyes daily as needed (itching, irritation).        Follow-up Information     [facility] Schedule an appointment as soon as possible for a visit.   Why: Please call the clinic and schedule a hospital follow up in the next 7-10 days. Contact information: [address] [place] [address] [phone]                Allergies  Allergen Reactions   Norvasc [Amlodipine Besylate] Other (See Comments)    Fluid buildup in chest   Voltaren [Diclofenac] Hives and Other (See Comments)    Fluid buildup in chest   Prilosec [Omeprazole] Other (See Comments)    MD stopped due to pancreatitis   Hydrochlorothiazide Itching   Ultram [Tramadol] Other (See Comments)    Stomach upset   Prednisone Hives and Other (See Comments)    Mood swings    Consultations: None   Procedures/Studies: CT ABDOMEN PELVIS W CONTRAST  Result Date: 09/24/2022 CLINICAL DATA:  Pancreatitis, acute, severe. EXAM: CT ABDOMEN AND PELVIS WITH CONTRAST TECHNIQUE: Multidetector CT imaging of the abdomen and pelvis was performed using the standard protocol following bolus administration of intravenous contrast. RADIATION DOSE REDUCTION: This exam was performed according to the departmental dose-optimization program which includes automated exposure control, adjustment of the mA and/or kV according to patient size  and/or use of iterative reconstruction technique. CONTRAST:  75mL OMNIPAQUE IOHEXOL 350 MG/ML SOLN COMPARISON:  CT abdomen/pelvis 05/04/2022. FINDINGS: Lower chest: No acute abnormality. Hepatobiliary: Hepatic steatosis. Prior cholecystectomy. No biliary dilatation. Pancreas: Edema and peripancreatic fat stranding with mild ductal dilatation predominantly involving the pancreatic head and uncinate process, consistent with acute pancreatitis. No peripancreatic fluid collection. Spleen: Normal. Adrenals/Urinary Tract: Adrenal glands are unremarkable. Unchanged 6 mm calyceal calculus in the lower pole left kidney. No mass or hydronephrosis. Absent right kidney. Bladder is unremarkable. Stomach/Bowel: Normal stomach. Inflammation along the second and third portions of the duodenum. No dilated loops of small bowel. Normal appendix is visualized on coronal image 76 series 5. Colon is decompressed. No wall thickening or surrounding inflammation. Vascular/Lymphatic: No significant vascular findings are present. No enlarged abdominal or pelvic lymph nodes. Reproductive: Prostate is unremarkable. Other: No abdominal wall hernia or abnormality. No abdominopelvic ascites. Musculoskeletal: No acute bony abnormality. Ankylosis of the right SI joint. Severe disc height loss at L5-S1. IMPRESSION: 1. Findings consistent with acute  pancreatitis. No peripancreatic fluid collection. 2. Hepatic steatosis. 3. Nonobstructing left renal calculus. Electronically Signed   By: Orvan Falconer M.D.   On: 09/24/2022 11:20   DG Chest Portable 1 View  Result Date: 09/24/2022 CLINICAL DATA:  cp EXAM: PORTABLE CHEST 1 VIEW COMPARISON:  Chest radiograph 12/23/2020 FINDINGS: The heart size and mediastinal contours are within normal limits. Both lungs are clear. The visualized skeletal structures are unremarkable. IMPRESSION: No active disease. Electronically Signed   By: Lorenza Cambridge M.D.   On: 09/24/2022 10:35      Subjective: Patient seen  and examined at bedside today.  Hemodynamically stable for discharge.  Denies any nausea, vomiting or abdominal pain today.  Discharge Exam: Vitals:   09/28/22 0355 09/28/22 0808  BP: (!) 171/97 (!) 150/104  Pulse: 68 85  Resp: 18 18  Temp: 97.9 F (36.6 C) 97.6 F (36.4 C)  SpO2: 99% 100%   Vitals:   09/27/22 1928 09/28/22 0300 09/28/22 0355 09/28/22 0808  BP: (!) 155/97 (!) 171/97 (!) 171/97 (!) 150/104  Pulse: 84 68 68 85  Resp: 18 18 18 18   Temp: 98.5 F (36.9 C) 97.9 F (36.6 C) 97.9 F (36.6 C) 97.6 F (36.4 C)  TempSrc: Oral Oral Oral Oral  SpO2: 98% 99% 99% 100%  Weight:      Height:        General: Pt is alert, awake, not in acute distress Cardiovascular: RRR, S1/S2 +, no rubs, no gallops Respiratory: CTA bilaterally, no wheezing, no rhonchi Abdominal: Soft, NT, ND, bowel sounds + Extremities: no edema, no cyanosis    The results of significant diagnostics from this hospitalization (including imaging, microbiology, ancillary and laboratory) are listed below for reference.     Microbiology: No results found for this or any previous visit (from the past 240 hour(s)).   Labs: BNP (last 3 results) No results for input(s): "BNP" in the last 8760 hours. Basic Metabolic Panel: Recent Labs  Lab 09/24/22 0710 09/25/22 0250 09/26/22 0331 09/27/22 0436 09/28/22 0458  NA 136 138 135 135 135  K 3.5 3.5 3.3* 4.0 3.7  CL 102 96* 101 100 100  CO2 21* 26 25 25 27   GLUCOSE 163* 108* 111* 124* 123*  BUN 8 7 6  <5* 7  CREATININE 1.27* 1.23 1.16 1.10 1.11  CALCIUM 8.9 9.4 8.9 8.9 9.3   Liver Function Tests: Recent Labs  Lab 09/24/22 0710 09/25/22 0250 09/26/22 0331 09/28/22 0458  AST 50* 152* 54* 35  ALT 28 69* 43 34  ALKPHOS 69 123 105 99  BILITOT 2.0* 2.4* 0.8 0.6  PROT 7.3 6.6 6.4* 6.7  ALBUMIN 4.0 3.5 3.2* 3.3*   Recent Labs  Lab 09/24/22 0710 09/26/22 0331  LIPASE 177* 62*   No results for input(s): "AMMONIA" in the last 168  hours. CBC: Recent Labs  Lab 09/24/22 0710 09/25/22 0250  WBC 13.3* 8.0  HGB 13.4 12.5*  HCT 40.3 36.8*  MCV 100.8* 100.0  PLT 158 144*   Cardiac Enzymes: No results for input(s): "CKTOTAL", "CKMB", "CKMBINDEX", "TROPONINI" in the last 168 hours. BNP: Invalid input(s): "POCBNP" CBG: No results for input(s): "GLUCAP" in the last 168 hours. D-Dimer No results for input(s): "DDIMER" in the last 72 hours. Hgb A1c No results for input(s): "HGBA1C" in the last 72 hours. Lipid Profile No results for input(s): "CHOL", "HDL", "LDLCALC", "TRIG", "CHOLHDL", "LDLDIRECT" in the last 72 hours. Thyroid function studies No results for input(s): "TSH", "T4TOTAL", "T3FREE", "THYROIDAB" in the last 72  hours.  Invalid input(s): "FREET3" Anemia work up No results for input(s): "VITAMINB12", "FOLATE", "FERRITIN", "TIBC", "IRON", "RETICCTPCT" in the last 72 hours. Urinalysis    Component Value Date/Time   COLORURINE YELLOW 09/24/2022 0706   APPEARANCEUR CLEAR 09/24/2022 0706   LABSPEC 1.041 (H) 09/24/2022 0706   PHURINE 5.0 09/24/2022 0706   GLUCOSEU NEGATIVE 09/24/2022 0706   HGBUR NEGATIVE 09/24/2022 0706   BILIRUBINUR NEGATIVE 09/24/2022 0706   KETONESUR NEGATIVE 09/24/2022 0706   PROTEINUR NEGATIVE 09/24/2022 0706   UROBILINOGEN 0.2 01/20/2015 1643   NITRITE NEGATIVE 09/24/2022 0706   LEUKOCYTESUR NEGATIVE 09/24/2022 0706   Sepsis Labs Recent Labs  Lab 09/24/22 0710 09/25/22 0250  WBC 13.3* 8.0   Microbiology No results found for this or any previous visit (from the past 240 hour(s)).  Please note: You were cared for by a hospitalist during your hospital stay. Once you are discharged, your primary care physician will handle any further medical issues. Please note that NO REFILLS for any discharge medications will be authorized once you are discharged, as it is imperative that you return to your primary care physician (or establish a relationship with a primary care physician if  you do not have one) for your post hospital discharge needs so that they can reassess your need for medications and monitor your lab values.    Time coordinating discharge: 40 minutes  SIGNED:   Burnadette Pop, MD  [facility] 09/28/2022, 10:47 AM Pager [phone]  If 7PM-7AM, please contact night-coverage [email or web address] Password [email or web address]

## 2022-09-28 NOTE — TOC Transition Note (Signed)
Transition of Care [facility]) - CM/SW Discharge Note   Patient Details  Name: Kyle Berry MRN: [id number] Date of Birth: 10-10-1970  Transition of [facility] CM/SW Contact:  Kermit Balo, RN Phone Number: 09/28/2022, 11:35 AM   Clinical Narrative:    Pt is discharging home with self care. TOC pharmacy will deliver his d/c medications to the room. Pt has transportation home.   Final next level of care: Home/Self Care Barriers to Discharge: No Barriers Identified   Patient Goals and CMS Choice CMS Medicare.gov Compare Post Acute Care list provided to:: Patient Choice offered to / list presented to : Patient  Discharge Placement                         Discharge Plan and Services Additional resources added to the After Visit Summary for     Discharge Planning Services: CM Consult Post Acute Care Choice: Resumption of Svcs/PTA Provider                               Social Determinants of Health (SDOH) Interventions SDOH Screenings   Food Insecurity: No Food Insecurity (09/24/2022)  Housing: Low Risk  (09/24/2022)  Transportation Needs: No Transportation Needs (09/24/2022)  Utilities: Not At Risk (09/24/2022)  Tobacco Use: Low Risk  (09/24/2022)     Readmission Risk Interventions    11/03/2020    2:19 PM  Readmission Risk Prevention Plan  Transportation Screening Complete  PCP or Specialist Appt within 3-5 Days Complete  HRI or Home Care Consult Complete  Social Work Consult for Recovery Care Planning/Counseling Complete  Palliative Care Screening Not Applicable

## 2022-10-12 ENCOUNTER — Encounter (HOSPITAL_COMMUNITY): Payer: Self-pay

## 2022-10-12 ENCOUNTER — Inpatient Hospital Stay (HOSPITAL_COMMUNITY)
Admission: EM | Admit: 2022-10-12 | Discharge: 2022-10-23 | DRG: 439 | Disposition: A | Discharge status: Home / Self Care | Payer: Managed Care, Other (non HMO) | Attending: Internal Medicine | Admitting: Internal Medicine

## 2022-10-12 ENCOUNTER — Other Ambulatory Visit: Payer: Self-pay

## 2022-10-12 ENCOUNTER — Emergency Department (HOSPITAL_COMMUNITY): Payer: Managed Care, Other (non HMO)

## 2022-10-12 DIAGNOSIS — E441 Mild protein-calorie malnutrition: Secondary | ICD-10-CM | POA: Diagnosis not present

## 2022-10-12 DIAGNOSIS — K861 Other chronic pancreatitis: Principal | ICD-10-CM

## 2022-10-12 DIAGNOSIS — E440 Moderate protein-calorie malnutrition: Secondary | ICD-10-CM | POA: Diagnosis present

## 2022-10-12 DIAGNOSIS — Z8249 Family history of ischemic heart disease and other diseases of the circulatory system: Secondary | ICD-10-CM

## 2022-10-12 DIAGNOSIS — K3184 Gastroparesis: Secondary | ICD-10-CM | POA: Diagnosis present

## 2022-10-12 DIAGNOSIS — Z841 Family history of disorders of kidney and ureter: Secondary | ICD-10-CM | POA: Diagnosis not present

## 2022-10-12 DIAGNOSIS — K852 Alcohol induced acute pancreatitis without necrosis or infection: Secondary | ICD-10-CM | POA: Diagnosis not present

## 2022-10-12 DIAGNOSIS — Z833 Family history of diabetes mellitus: Secondary | ICD-10-CM | POA: Diagnosis not present

## 2022-10-12 DIAGNOSIS — Z800 Family history of malignant neoplasm of digestive organs: Secondary | ICD-10-CM | POA: Diagnosis not present

## 2022-10-12 DIAGNOSIS — G8929 Other chronic pain: Secondary | ICD-10-CM

## 2022-10-12 DIAGNOSIS — K2289 Other specified disease of esophagus: Secondary | ICD-10-CM | POA: Diagnosis present

## 2022-10-12 DIAGNOSIS — K3189 Other diseases of stomach and duodenum: Secondary | ICD-10-CM | POA: Diagnosis present

## 2022-10-12 DIAGNOSIS — Z8719 Personal history of other diseases of the digestive system: Secondary | ICD-10-CM | POA: Diagnosis not present

## 2022-10-12 DIAGNOSIS — K869 Disease of pancreas, unspecified: Secondary | ICD-10-CM | POA: Diagnosis not present

## 2022-10-12 DIAGNOSIS — K859 Acute pancreatitis without necrosis or infection, unspecified: Secondary | ICD-10-CM | POA: Diagnosis not present

## 2022-10-12 DIAGNOSIS — I10 Essential (primary) hypertension: Secondary | ICD-10-CM | POA: Diagnosis present

## 2022-10-12 DIAGNOSIS — Z886 Allergy status to analgesic agent status: Secondary | ICD-10-CM

## 2022-10-12 DIAGNOSIS — F1010 Alcohol abuse, uncomplicated: Secondary | ICD-10-CM | POA: Diagnosis present

## 2022-10-12 DIAGNOSIS — Z6820 Body mass index (BMI) 20.0-20.9, adult: Secondary | ICD-10-CM | POA: Diagnosis not present

## 2022-10-12 DIAGNOSIS — Z9049 Acquired absence of other specified parts of digestive tract: Secondary | ICD-10-CM | POA: Diagnosis not present

## 2022-10-12 DIAGNOSIS — Z888 Allergy status to other drugs, medicaments and biological substances status: Secondary | ICD-10-CM | POA: Diagnosis not present

## 2022-10-12 DIAGNOSIS — K219 Gastro-esophageal reflux disease without esophagitis: Secondary | ICD-10-CM | POA: Diagnosis present

## 2022-10-12 DIAGNOSIS — K860 Alcohol-induced chronic pancreatitis: Secondary | ICD-10-CM | POA: Diagnosis present

## 2022-10-12 DIAGNOSIS — I899 Noninfective disorder of lymphatic vessels and lymph nodes, unspecified: Secondary | ICD-10-CM | POA: Diagnosis not present

## 2022-10-12 DIAGNOSIS — Z418 Encounter for other procedures for purposes other than remedying health state: Secondary | ICD-10-CM | POA: Diagnosis not present

## 2022-10-12 DIAGNOSIS — K449 Diaphragmatic hernia without obstruction or gangrene: Secondary | ICD-10-CM | POA: Diagnosis present

## 2022-10-12 DIAGNOSIS — Z86010 Personal history of colonic polyps: Secondary | ICD-10-CM

## 2022-10-12 DIAGNOSIS — K838 Other specified diseases of biliary tract: Secondary | ICD-10-CM | POA: Diagnosis not present

## 2022-10-12 DIAGNOSIS — R109 Unspecified abdominal pain: Secondary | ICD-10-CM | POA: Diagnosis not present

## 2022-10-12 DIAGNOSIS — Z79899 Other long term (current) drug therapy: Secondary | ICD-10-CM | POA: Diagnosis not present

## 2022-10-12 DIAGNOSIS — E7800 Pure hypercholesterolemia, unspecified: Secondary | ICD-10-CM | POA: Diagnosis present

## 2022-10-12 LAB — CBC WITH DIFFERENTIAL/PLATELET
Abs Immature Granulocytes: 0.03 10*3/uL (ref 0.00–0.07)
Basophils Absolute: 0 10*3/uL (ref 0.0–0.1)
Basophils Relative: 0 %
Eosinophils Absolute: 0.1 10*3/uL (ref 0.0–0.5)
Eosinophils Relative: 1 %
HCT: 41.4 % (ref 39.0–52.0)
Hemoglobin: 13.7 g/dL (ref 13.0–17.0)
Immature Granulocytes: 0 %
Lymphocytes Relative: 14 %
Lymphs Abs: 1.1 10*3/uL (ref 0.7–4.0)
MCH: 33.8 pg (ref 26.0–34.0)
MCHC: 33.1 g/dL (ref 30.0–36.0)
MCV: 102.2 fL — ABNORMAL HIGH (ref 80.0–100.0)
Monocytes Absolute: 0.4 10*3/uL (ref 0.1–1.0)
Monocytes Relative: 5 %
Neutro Abs: 6.4 10*3/uL (ref 1.7–7.7)
Neutrophils Relative %: 80 %
Platelets: 194 10*3/uL (ref 150–400)
RBC: 4.05 MIL/uL — ABNORMAL LOW (ref 4.22–5.81)
RDW: 15.3 % (ref 11.5–15.5)
WBC: 8 10*3/uL (ref 4.0–10.5)
nRBC: 0 % (ref 0.0–0.2)

## 2022-10-12 LAB — COMPREHENSIVE METABOLIC PANEL
ALT: 32 U/L (ref 0–44)
AST: 54 U/L — ABNORMAL HIGH (ref 15–41)
Albumin: 4 g/dL (ref 3.5–5.0)
Alkaline Phosphatase: 87 U/L (ref 38–126)
Anion gap: 13 (ref 5–15)
BUN: <5 mg/dL — ABNORMAL LOW (ref 6–20)
CO2: 19 mmol/L — ABNORMAL LOW (ref 22–32)
Calcium: 9.1 mg/dL (ref 8.9–10.3)
Chloride: 105 mmol/L (ref 98–111)
Creatinine, Ser: 1.2 mg/dL (ref 0.61–1.24)
GFR, Estimated: >60 mL/min (ref >60)
Glucose, Bld: 125 mg/dL — ABNORMAL HIGH (ref 70–99)
Potassium: 3.6 mmol/L (ref 3.5–5.1)
Sodium: 137 mmol/L (ref 135–145)
Total Bilirubin: 1.3 mg/dL — ABNORMAL HIGH (ref 0.3–1.2)
Total Protein: 7.2 g/dL (ref 6.5–8.1)

## 2022-10-12 LAB — LIPASE, BLOOD: Lipase: 317 U/L — ABNORMAL HIGH (ref 11–51)

## 2022-10-12 MED ORDER — GABAPENTIN 300 MG PO CAPS
300.0000 mg | ORAL_CAPSULE | Freq: Three times a day (TID) | ORAL | Status: DC
  Administered 2022-10-13 – 2022-10-23 (×31): 300 mg via ORAL
  Filled 2022-10-12 (×31): qty 1

## 2022-10-12 MED ORDER — ONDANSETRON HCL 4 MG/2ML IJ SOLN
4.0000 mg | Freq: Four times a day (QID) | INTRAMUSCULAR | Status: DC | PRN
  Administered 2022-10-12: 4 mg via INTRAVENOUS
  Filled 2022-10-12 (×2): qty 2

## 2022-10-12 MED ORDER — ONDANSETRON HCL 4 MG/2ML IJ SOLN
4.0000 mg | Freq: Four times a day (QID) | INTRAMUSCULAR | Status: DC | PRN
  Administered 2022-10-13 – 2022-10-15 (×3): 4 mg via INTRAVENOUS
  Filled 2022-10-12 (×4): qty 2

## 2022-10-12 MED ORDER — PANTOPRAZOLE SODIUM 40 MG PO TBEC
40.0000 mg | DELAYED_RELEASE_TABLET | Freq: Every day | ORAL | Status: DC
  Administered 2022-10-13 – 2022-10-23 (×11): 40 mg via ORAL
  Filled 2022-10-12 (×11): qty 1

## 2022-10-12 MED ORDER — ACETAMINOPHEN 650 MG RE SUPP: 650.0000 mg | Freq: Four times a day (QID) | RECTAL | Status: DC | PRN

## 2022-10-12 MED ORDER — HYDROCODONE-ACETAMINOPHEN 5-325 MG PO TABS
1.0000 | ORAL_TABLET | ORAL | Status: DC | PRN
  Administered 2022-10-13 – 2022-10-18 (×9): 2 via ORAL
  Administered 2022-10-18: 1 via ORAL
  Administered 2022-10-19 – 2022-10-22 (×8): 2 via ORAL
  Administered 2022-10-23: 1 via ORAL
  Administered 2022-10-23: 2 via ORAL
  Filled 2022-10-12 (×24): qty 2

## 2022-10-12 MED ORDER — ACETAMINOPHEN 325 MG PO TABS: 650.0000 mg | ORAL_TABLET | Freq: Four times a day (QID) | ORAL | Status: DC | PRN

## 2022-10-12 MED ORDER — HYDROMORPHONE HCL 1 MG/ML IJ SOLN
1.0000 mg | Freq: Once | INTRAMUSCULAR | Status: AC
  Administered 2022-10-12: 1 mg via INTRAVENOUS
  Filled 2022-10-12: qty 1

## 2022-10-12 MED ORDER — MORPHINE SULFATE (PF) 2 MG/ML IV SOLN
2.0000 mg | INTRAVENOUS | Status: DC | PRN
  Administered 2022-10-12 – 2022-10-13 (×2): 2 mg via INTRAVENOUS
  Filled 2022-10-12 (×2): qty 1

## 2022-10-12 MED ORDER — PANCRELIPASE (LIP-PROT-AMYL) 12000-38000 UNITS PO CPEP
24000.0000 [IU] | ORAL_CAPSULE | Freq: Three times a day (TID) | ORAL | Status: DC
  Administered 2022-10-13 – 2022-10-21 (×23): 24000 [IU] via ORAL
  Filled 2022-10-12 (×25): qty 2

## 2022-10-12 MED ORDER — ENOXAPARIN SODIUM 40 MG/0.4ML IJ SOSY
40.0000 mg | PREFILLED_SYRINGE | INTRAMUSCULAR | Status: DC
  Administered 2022-10-13 – 2022-10-19 (×7): 40 mg via SUBCUTANEOUS
  Filled 2022-10-12 (×7): qty 0.4

## 2022-10-12 MED ORDER — LACTATED RINGERS IV BOLUS
2000.0000 mL | Freq: Once | INTRAVENOUS | Status: AC
  Administered 2022-10-12: 2000 mL via INTRAVENOUS

## 2022-10-12 MED ORDER — SODIUM CHLORIDE 0.9 % IV BOLUS
1000.0000 mL | Freq: Once | INTRAVENOUS | Status: AC
  Administered 2022-10-12: 1000 mL via INTRAVENOUS

## 2022-10-12 MED ORDER — SODIUM CHLORIDE 0.9 % IV SOLN
25.0000 mg | Freq: Once | INTRAVENOUS | Status: AC
  Administered 2022-10-12: 25 mg via INTRAVENOUS
  Filled 2022-10-12: qty 1

## 2022-10-12 MED ORDER — ONDANSETRON HCL 4 MG PO TABS: 4.0000 mg | ORAL_TABLET | Freq: Four times a day (QID) | ORAL | Status: DC | PRN

## 2022-10-12 MED ORDER — LACTATED RINGERS IV SOLN: INTRAVENOUS | Status: DC

## 2022-10-12 NOTE — ED Provider Notes (Signed)
[facility] Provider Note   CSN: [id number] Arrival date & time: 10/12/22  1657     History  Chief Complaint  Patient presents with   Abdominal Pain    Kyle Berry is a 52 y.o. male with PMHx idiopathic chronic pancreatitis with frequent admissions who presents to ED concerned for LUQ abdominal pain. Also complaining of subjective fever, nausea and vomiting. Patient discharged 09/28/2022 for pancreatitis and states that pain had never fully resolved. Pain increased today and patient has not been able to tolerate PO fluids or food. Endorses compliance with pancreatitis diet.   Abdominal Pain      Home Medications Prior to Admission medications   Medication Sig Start Date End Date Taking? Authorizing Provider  acetaminophen (TYLENOL) 325 MG tablet Take 650 mg by mouth daily as needed for moderate pain, headache or fever.    [provider]  albuterol (VENTOLIN HFA) 108 (90 Base) MCG/ACT inhaler Inhale 2 puffs into the lungs every 6 (six) hours as needed for wheezing or shortness of breath.    [provider]  famotidine (PEPCID) 20 MG tablet Take 1 tablet (20 mg total) by mouth 2 (two) times daily. 09/28/22 03/27/23  Burnadette Pop, MD  folic acid (FOLVITE) 1 MG tablet Take 1 tablet (1 mg total) by mouth daily. 09/29/22   Burnadette Pop, MD  gabapentin (NEURONTIN) 100 MG capsule Take 1 capsule (100 mg total) by mouth 3 (three) times daily. 10/27/21   Rolly Salter, MD  Pancrelipase, Lip-Prot-Amyl, 24000-76000 units CPEP Take 1 capsule (24,000 Units total) by mouth 3 (three) times daily with meals. 09/28/22   Burnadette Pop, MD  Naphazoline-Pheniramine (VISINE-A OP) Place 2 drops into both eyes daily as needed (itching, irritation).    [provider]  ondansetron (ZOFRAN) 4 MG tablet Take 1 tablet (4 mg total) by mouth every 6 (six) hours as needed for nausea. 05/11/22   Kathlen Mody, MD  pantoprazole  (PROTONIX) 40 MG tablet Take 1 tablet (40 mg total) by mouth daily at 6 (six) AM. 05/12/22   Kathlen Mody, MD  promethazine (PHENERGAN) 25 MG tablet Take 25 mg by mouth every 6 (six) hours as needed for nausea or vomiting.    [provider]  telmisartan (MICARDIS) 40 MG tablet Take 1 tablet (40 mg total) by mouth daily. 03/07/22 05/06/23  Meredeth Ide, MD  thiamine (VITAMIN B1) 100 MG tablet Take 1 tablet (100 mg total) by mouth daily. 09/29/22   Burnadette Pop, MD  traMADol (ULTRAM) 50 MG tablet Take 50 mg by mouth every 8 (eight) hours as needed for severe pain.    [provider]      Allergies    Norvasc [amlodipine besylate], Voltaren [diclofenac], Prilosec [omeprazole], Hydrochlorothiazide, Ultram [tramadol], and Prednisone    Review of Systems   Review of Systems  Gastrointestinal:  Positive for abdominal pain.    Physical Exam Updated Vital Signs BP (!) 149/90 (BP Location: Right Arm)   Pulse 82   Resp (!) 24   Ht 6\' 1"  (1.854 m)   Wt 72.6 kg   SpO2 100%   BMI 21.11 kg/m  Physical Exam Vitals and nursing note reviewed.  Constitutional:      General: He is in acute distress.  HENT:     Head: Normocephalic and atraumatic.     Mouth/Throat:     Mouth: Mucous membranes are moist.     Pharynx: No posterior oropharyngeal erythema.  Eyes:  General: No scleral icterus.       Right eye: No discharge.        Left eye: No discharge.     Conjunctiva/sclera: Conjunctivae normal.  Cardiovascular:     Rate and Rhythm: Regular rhythm. Tachycardia present.     Pulses: Normal pulses.     Heart sounds: Normal heart sounds. No murmur heard. Pulmonary:     Effort: Pulmonary effort is normal.  Abdominal:     General: Abdomen is flat. Bowel sounds are normal. There is no distension.     Palpations: Abdomen is soft. There is no mass.     Comments: LUQ tenderness to palpation.  Musculoskeletal:     Right lower leg: No edema.     Left lower leg: No edema.   Skin:    General: Skin is warm and dry.     Findings: No rash.  Neurological:     General: No focal deficit present.     Mental Status: He is alert. Mental status is at baseline.  Psychiatric:        Mood and Affect: Mood normal.     ED Results / Procedures / Treatments   Labs (all labs ordered are listed, but only abnormal results are displayed) Labs Reviewed  COMPREHENSIVE METABOLIC PANEL - Abnormal; Notable for the following components:      Result Value   CO2 19 (*)    Glucose, Bld 125 (*)    BUN <5 (*)    AST 54 (*)    Total Bilirubin 1.3 (*)    All other components within normal limits  LIPASE, BLOOD - Abnormal; Notable for the following components:   Lipase 317 (*)    All other components within normal limits  CBC WITH DIFFERENTIAL/PLATELET - Abnormal; Notable for the following components:   RBC 4.05 (*)    MCV 102.2 (*)    All other components within normal limits    EKG EKG Interpretation Date/Time:  Friday October 12 2022 17:18:09 EDT Ventricular Rate:  104 PR Interval:  174 QRS Duration:  81 QT Interval:  347 QTC Calculation: 457 R Axis:   67  Text Interpretation: Sinus tachycardia Probable left atrial enlargement RSR' in V1 or V2, right VCD or RVH Left ventricular hypertrophy ST elevation suggests acute pericarditis when compared to prior, similar appearnce. No STEMI Confirmed by Theda Belfast ([id number]) on 10/12/2022 7:31:35 PM  Radiology No results found.  Procedures Procedures    Medications Ordered in ED Medications  ondansetron (ZOFRAN) injection 4 mg (4 mg Intravenous Given 10/12/22 1917)  HYDROmorphone (DILAUDID) injection 1 mg (1 mg Intravenous Given 10/12/22 1916)  sodium chloride 0.9 % bolus 1,000 mL (1,000 mLs Intravenous New Bag/Given 10/12/22 1945)  HYDROmorphone (DILAUDID) injection 1 mg (1 mg Intravenous Given 10/12/22 2047)    ED Course/ Medical Decision Making/ A&P                             Medical Decision Making   This patient  presents to the ED for concern of abdominal pain, this involves an extensive number of treatment options, and is a complaint that carries with it a high risk of complications and morbidity.  The differential diagnosis includes gastroenteritis, colitis, small bowel obstruction, appendicitis, cholecystitis, pancreatitis, nephrolithiasis, UTI, pyelonephritis  Co morbidities that complicate the patient evaluation  Idiopathic chronic pancreatitis   Additional history obtained:  Additional history obtained from 09/28/22 discharge note -Patient able to tolerate  PO food and fluids - discharged.   Lab Tests:  I Ordered, and personally interpreted labs.  The pertinent results include:   CBC with differential: No concern for anemia or leukocytosis CMP: no concern for electrolyte abnormality; no concern for kidney/liver damage Lipase: 317    Imaging Studies ordered:  I ordered imaging studies including  CT Abd/Pelvis with contrast: evaluate for structural/surgical etiology of patients' severe abdominal pain.  I independently visualized and interpreted imaging I agree with the radiologist interpretation   Cardiac Monitoring: / EKG:  The patient was maintained on a cardiac monitor.  I personally viewed and interpreted the cardiac monitored which showed an underlying rhythm of: sinus tachycardia   Consultations Obtained:  I requested consultation with the Hospitalist, Dr. Avie Arenas,  and discussed lab and imaging findings as well as pertinent plan - they agree to admit patient   Problem List / ED Course / Critical interventions / Medication management  Patient presented for abdominal pain.  Physical exam with tenderness to palpation of LUQ.  Lipase 317.  CBC without leukocytosis or anemia.  CMP without electrolyte abnormalities.  Ordering CT abdomen pelvis to assess for complications that could be contributed to patient's increased pain.  Provided patient with pain management, Zofran and  fluids. CT pending. I believe patient is appropriate for inpatient mission at this time given his uncontrolled pain and not being able to tolerate PO fluids. Consulting Hospitalist for admission for pancreatitis and uncontrolled pain. Dr. Avie Arenas is admitting provider. I have reviewed the patients home medicines and have made adjustments as needed    Ddx: These are considered less likely due to history of present illness and physical exam. -gastroenteritis: No vomiting in ED; no fever; rating p.o. intake -colitis: Denies diarrhea, patient afebrile  -appendicitis: Negative McBurney point, rebound tenderness, psoas, obturator sign  -cholecystitis: Negative Murphy sign; liver enzymes within normal limits -pancreatitis: No LUQ tenderness to palpation, lipase within normal limits -nephrolithiasis: Denies flank pain and urinary complaints  -UTI/pyelonephritis: Denies urinary complaints  -testicular torsion: pain does not radiate into testicles   Social Determinants of Health:  none           Final Clinical Impression(s) / ED Diagnoses Final diagnoses:  Idiopathic chronic pancreatitis [facility])    Rx / DC Orders ED Discharge Orders     None         Dorthy Cooler, [place] 10/12/22 2148    Tegeler, Canary Brim, MD 10/13/22 [phone]

## 2022-10-12 NOTE — H&P (Signed)
History and Physical    Kyle Berry:[id number] DOB: 07/26/1970 DOA: 10/12/2022  PCP: [facility]   Chief Complaint: abd pain  HPI: Kyle Berry is a 52 y.o. male with medical history significant of gastroparesis, GERD, chronic pancreatitis on Creon who presented to the emergency department with recurrent abdominal pain.  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  He intermittently drinks alcohol however states it has been several weeks.  This afternoon he had intractable abdominal pain nausea and vomiting consistent with his prior pancreatitis flares.  He presented to the emergency department further assessment.  ER evaluation revealed elevated lipase.  Remaining labs showed bicarb 19, WBC 8.0, hemoglobin 13.7.  Patient just had a CT scan 2 weeks ago which showed acute pancreatitis after which she was discharged.  Patient was given IV fluids and admitted for further workup.  Of note, he seen GI in the past and has a history of pancreatic divisum.  He remains on several medications that are known to cause his medication induced pancreatitis including angiotensin receptor blocker.  On admission he was endorsing persistent abdominal pain.  He denied other complaints. Review of Systems: Review of Systems  All other systems reviewed and are negative.    As per HPI otherwise 10 point review of systems negative.   Allergies  Allergen Reactions   Norvasc [Amlodipine Besylate] Other (See Comments)    Fluid buildup in chest   Voltaren [Diclofenac] Hives and Other (See Comments)    Fluid buildup in chest   Prilosec [Omeprazole] Other (See Comments)    MD stopped due to pancreatitis   Hydrochlorothiazide Itching   Ultram [Tramadol] Other (See Comments)    Stomach upset   Prednisone Hives and Other (See Comments)    Mood swings    Past Medical History:  Diagnosis Date   Atrophy of right kidney    Chronic  bronchitis (HCC)    Chronic lower back pain    Chronic pancreatitis (HCC)    Gastroparesis 06/2020   confirmed by emptying study at [facility]   GERD (gastroesophageal reflux disease)    Headache    "once/month" (05/13/2017)   High cholesterol    Hypertension    Migraine    "a couple/year" (05/13/2017)   Nephrolithiasis 05/13/2017   Pancreatitis    Pneumonia ~ 09/2015   Presence of pancreatic duct stent    Recurrent acute pancreatitis    Shortness of breath 05/22/2021    Past Surgical History:  Procedure Laterality Date   BIOPSY  11/02/2020   Procedure: BIOPSY;  Surgeon: [name]., MD;  Location: [facility] ENDOSCOPY;  Service: Gastroenterology;;   CHOLECYSTECTOMY N/A 10/23/2017   Procedure: LAPAROSCOPIC CHOLECYSTECTOMY WITH INTRAOPERATIVE CHOLANGIOGRAM;  Surgeon: Luretha Murphy, MD;  Location: [facility] ORS;  Service: General;  Laterality: N/A;   COLONOSCOPY WITH PROPOFOL N/A 11/02/2019   Procedure: COLONOSCOPY WITH PROPOFOL;  Surgeon: Hilarie Fredrickson, MD;  Location: [facility] ENDOSCOPY;  Service: Endoscopy;  Laterality: N/A;   ERCP N/A 11/02/2020   Procedure: ENDOSCOPIC RETROGRADE CHOLANGIOPANCREATOGRAPHY (ERCP);  Surgeon: [name]., MD;  Location: [facility] ENDOSCOPY;  Service: Gastroenterology;  Laterality: N/A;   ESOPHAGOGASTRODUODENOSCOPY N/A 11/02/2020   Procedure: ESOPHAGOGASTRODUODENOSCOPY (EGD);  Surgeon: [name]., MD;  Location: [facility] ENDOSCOPY;  Service: Gastroenterology;  Laterality: N/A;   ESOPHAGOGASTRODUODENOSCOPY (EGD) WITH PROPOFOL N/A 11/06/2019   Procedure: ESOPHAGOGASTRODUODENOSCOPY (EGD) WITH PROPOFOL;  Surgeon: Hilarie Fredrickson, MD;  Location: [facility] ENDOSCOPY;  Service: Endoscopy;  Laterality: N/A;  HEMOSTASIS CLIP PLACEMENT  11/02/2019   Procedure: HEMOSTASIS CLIP PLACEMENT;  Surgeon: Hilarie Fredrickson, MD;  Location: [facility] ENDOSCOPY;  Service: Endoscopy;;   NO PAST SURGERIES     REMOVAL OF STONES  11/02/2020   Procedure: REMOVAL OF STONES;  Surgeon: [name].,  MD;  Location: [facility] ENDOSCOPY;  Service: Gastroenterology;;   Francine Graven REMOVAL  11/02/2020   Procedure: STENT REMOVAL;  Surgeon: [name]., MD;  Location: [facility] ENDOSCOPY;  Service: Gastroenterology;;     reports that he has never smoked. He has never used smokeless tobacco. He reports that he does not currently use alcohol. He reports that he does not use drugs.  Family History  Problem Relation Age of Onset   Hypertension Other    Hypertension Mother    Hypertension Father    Kidney disease Father    Hypertension Sister    Diabetes Sister    Hypertension Brother    Pancreatic cancer Paternal Grandmother    Colon cancer Cousin    Stomach cancer Neg Hx    Esophageal cancer Neg Hx     Prior to Admission medications   Medication Sig Start Date End Date Taking? Authorizing Provider  acetaminophen (TYLENOL) 325 MG tablet Take 650 mg by mouth daily as needed for moderate pain, headache or fever.    [provider]  albuterol (VENTOLIN HFA) 108 (90 Base) MCG/ACT inhaler Inhale 2 puffs into the lungs every 6 (six) hours as needed for wheezing or shortness of breath.    [provider]  famotidine (PEPCID) 20 MG tablet Take 1 tablet (20 mg total) by mouth 2 (two) times daily. 09/28/22 03/27/23  Burnadette Pop, MD  folic acid (FOLVITE) 1 MG tablet Take 1 tablet (1 mg total) by mouth daily. 09/29/22   Burnadette Pop, MD  gabapentin (NEURONTIN) 100 MG capsule Take 1 capsule (100 mg total) by mouth 3 (three) times daily. 10/27/21   Rolly Salter, MD  Pancrelipase, Lip-Prot-Amyl, 24000-76000 units CPEP Take 1 capsule (24,000 Units total) by mouth 3 (three) times daily with meals. 09/28/22   Burnadette Pop, MD  Naphazoline-Pheniramine (VISINE-A OP) Place 2 drops into both eyes daily as needed (itching, irritation).    [provider]  ondansetron (ZOFRAN) 4 MG tablet Take 1 tablet (4 mg total) by mouth every 6 (six) hours as needed for nausea. 05/11/22   Kathlen Mody, MD  pantoprazole (PROTONIX) 40 MG tablet Take 1 tablet (40 mg total) by mouth daily at 6 (six) AM. 05/12/22   Kathlen Mody, MD  promethazine (PHENERGAN) 25 MG tablet Take 25 mg by mouth every 6 (six) hours as needed for nausea or vomiting.    [provider]  telmisartan (MICARDIS) 40 MG tablet Take 1 tablet (40 mg total) by mouth daily. 03/07/22 05/06/23  Meredeth Ide, MD  thiamine (VITAMIN B1) 100 MG tablet Take 1 tablet (100 mg total) by mouth daily. 09/29/22   Burnadette Pop, MD  traMADol (ULTRAM) 50 MG tablet Take 50 mg by mouth every 8 (eight) hours as needed for severe pain.    [provider]    Physical Exam: Vitals:   10/12/22 2015 10/12/22 2302 10/12/22 2315 10/12/22 2333  BP:  (!) 179/103    Pulse: 82 65 66 71  Resp: (!) 24 14 15 20   Temp:  98 F (36.7 C)    TempSrc:  Oral    SpO2: 100% 100% 95% 98%  Weight:      Height:  Physical Exam Constitutional:      Appearance: He is normal weight.  HENT:     Head: Normocephalic.  Cardiovascular:     Rate and Rhythm: Normal rate and regular rhythm.  Pulmonary:     Effort: Pulmonary effort is normal.     Breath sounds: Normal breath sounds.  Abdominal:     General: Abdomen is flat and protuberant. Bowel sounds are normal.     Palpations: Abdomen is soft.  Skin:    General: Skin is warm and dry.  Neurological:     General: No focal deficit present.     Mental Status: He is alert.  Psychiatric:        Mood and Affect: Mood normal.        Labs on Admission: I have personally reviewed the patients's labs and imaging studies.  Assessment/Plan  Acute on chronic idiopathic pancreatitis, POA, active - Patient had history of prior positive Peth test.  He states that he has not drank in several days.  Certainly has an alcohol component to his pancreatitis. -Also on angiotensin receptor blockers - Recent admission to [facility] with similar complaints and was discharged on 6/14 with  outpatient follow-up.  He was strongly encouraged to fully abstain from alcohol at that time. - Continue Creon supplementation - Aggressive IV hydration - As needed analgesia - Continue 3 times daily gabapentin - cld  Hypertension-hold antihypertensives  GERD-continue PPI   Admission status: Inpatient Telemetry Medical  Certification: The appropriate patient status for this patient is INPATIENT. Inpatient status is judged to be reasonable and necessary in order to provide the required intensity of service to ensure the patient's safety. The patient's presenting symptoms, physical exam findings, and initial radiographic and laboratory data in the context of their chronic comorbidities is felt to place them at high risk for further clinical deterioration. Furthermore, it is not anticipated that the patient will be medically stable for discharge from the hospital within 2 midnights of admission.   * I certify that at the point of admission it is my clinical judgment that the patient will require inpatient hospital care spanning beyond 2 midnights from the point of admission due to high intensity of service, high risk for further deterioration and high frequency of surveillance required.Alan Mulder MD [facility] If 7PM-7AM, please contact night-coverage [email or web address]  10/12/2022, 11:38 PM

## 2022-10-12 NOTE — ED Triage Notes (Signed)
Abdominal pain x2 days, coming from home. Nausea and vomiting. Recent hospital admission for pancreatitis. Alert and oriented.  153/109 100pulse

## 2022-10-12 NOTE — ED Notes (Signed)
ED TO INPATIENT HANDOFF REPORT  ED Nurse Name and Phone #: [phone]  S Name/Age/Gender Kyle Berry 52 y.o. male Room/Bed: 010C/010C  Code Status   Code Status: Full Code  Home/SNF/Other Home Patient oriented to: self, place, time, and situation Is this baseline? 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 Nausea and vomiting. Recent hospital admission for pancreatitis. Alert and oriented.  153/109 100pulse      Allergies Allergies  Allergen Reactions   Norvasc [Amlodipine Besylate] Other (See Comments)    Fluid buildup in chest   Voltaren [Diclofenac] Hives and Other (See Comments)    Fluid buildup in chest   Prilosec [Omeprazole] Other (See Comments)    MD stopped due to pancreatitis   Hydrochlorothiazide Itching   Ultram [Tramadol] Other (See Comments)    Stomach upset   Prednisone Hives and Other (See Comments)    Mood swings    Level of Care/Admitting Diagnosis ED Disposition     ED Disposition  Admit   Condition  --   Comment  Hospital [facility] [[id number]]  Level of Care: Telemetry Medical [104]  May admit patient to [facility] or [facility] if equivalent level of care is available:: Yes  Covid Evaluation: Asymptomatic - no recent exposure (last 10 days) testing not required  Diagnosis: Alcohol-induced pancreatitis [811914]  Admitting Physician: Alan Mulder [[id number]]  Attending Physician: Alan Mulder [[id number]]  Certification:: I certify this patient will need inpatient services for at least 2 midnights  Estimated Length of Stay: 2          B Medical/Surgery History Past Medical History:  Diagnosis Date   Atrophy of right kidney    Chronic bronchitis (HCC)    Chronic lower back pain    Chronic pancreatitis (HCC)    Gastroparesis 06/2020   confirmed by emptying study at [facility]   GERD (gastroesophageal  reflux disease)    Headache    "once/month" (05/13/2017)   High cholesterol    Hypertension    Migraine    "a couple/year" (05/13/2017)   Nephrolithiasis 05/13/2017   Pancreatitis    Pneumonia ~ 09/2015   Presence of pancreatic duct stent    Recurrent acute pancreatitis    Shortness of breath 05/22/2021   Past Surgical History:  Procedure Laterality Date   BIOPSY  11/02/2020   Procedure: BIOPSY;  Surgeon: [name]., MD;  Location: [facility] ENDOSCOPY;  Service: Gastroenterology;;   CHOLECYSTECTOMY N/A 10/23/2017   Procedure: LAPAROSCOPIC CHOLECYSTECTOMY WITH INTRAOPERATIVE CHOLANGIOGRAM;  Surgeon: Luretha Murphy, MD;  Location: [facility] ORS;  Service: General;  Laterality: N/A;   COLONOSCOPY WITH PROPOFOL N/A 11/02/2019   Procedure: COLONOSCOPY WITH PROPOFOL;  Surgeon: Hilarie Fredrickson, MD;  Location: [facility] ENDOSCOPY;  Service: Endoscopy;  Laterality: N/A;   ERCP N/A 11/02/2020   Procedure: ENDOSCOPIC RETROGRADE CHOLANGIOPANCREATOGRAPHY (ERCP);  Surgeon: [name]., MD;  Location: [facility] ENDOSCOPY;  Service: Gastroenterology;  Laterality: N/A;   ESOPHAGOGASTRODUODENOSCOPY N/A 11/02/2020   Procedure: ESOPHAGOGASTRODUODENOSCOPY (EGD);  Surgeon: [name]., MD;  Location: [facility] ENDOSCOPY;  Service: Gastroenterology;  Laterality: N/A;   ESOPHAGOGASTRODUODENOSCOPY (EGD) WITH PROPOFOL N/A 11/06/2019   Procedure: ESOPHAGOGASTRODUODENOSCOPY (EGD) WITH PROPOFOL;  Surgeon: Hilarie Fredrickson, MD;  Location: [facility] ENDOSCOPY;  Service: Endoscopy;  Laterality: N/A;   HEMOSTASIS CLIP PLACEMENT  11/02/2019   Procedure: HEMOSTASIS CLIP PLACEMENT;  Surgeon: Hilarie Fredrickson, MD;  Location: [facility] ENDOSCOPY;  Service:  Endoscopy;;   NO PAST SURGERIES     REMOVAL OF STONES  11/02/2020   Procedure: REMOVAL OF STONES;  Surgeon: [name]., MD;  Location: [facility] ENDOSCOPY;  Service: Gastroenterology;;   Francine Graven REMOVAL  11/02/2020   Procedure: STENT REMOVAL;  Surgeon: [name]., MD;  Location: [facility]  ENDOSCOPY;  Service: Gastroenterology;;     A IV Location/Drains/Wounds Patient Lines/Drains/Airways Status     Active Line/Drains/Airways     Name Placement date Placement time Site Days   Peripheral IV 10/12/22 20 G 1.88" Anterior;Right Forearm 10/12/22  1842  Forearm  less than 1            Intake/Output Last 24 hours  Intake/Output Summary (Last 24 hours) at 10/12/2022 2245 Last data filed at 10/12/2022 2048 Gross per 24 hour  Intake --  Output 200 ml  Net -200 ml    Labs/Imaging Results for orders placed or performed during the hospital encounter of 10/12/22 (from the past 48 hour(s))  Comprehensive metabolic panel     Status: Abnormal   Collection Time: 10/12/22  5:49 PM  Result Value Ref Range   Sodium 137 135 - 145 mmol/L   Potassium 3.6 3.5 - 5.1 mmol/L   Chloride 105 98 - 111 mmol/L   CO2 19 (L) 22 - 32 mmol/L   Glucose, Bld 125 (H) 70 - 99 mg/dL    Comment: Glucose reference range applies only to samples taken after fasting for at least 8 hours.   BUN <5 (L) 6 - 20 mg/dL   Creatinine, Ser 8.29 0.61 - 1.24 mg/dL   Calcium 9.1 8.9 - 56.2 mg/dL   Total Protein 7.2 6.5 - 8.1 g/dL   Albumin 4.0 3.5 - 5.0 g/dL   AST 54 (H) 15 - 41 U/L   ALT 32 0 - 44 U/L   Alkaline Phosphatase 87 38 - 126 U/L   Total Bilirubin 1.3 (H) 0.3 - 1.2 mg/dL   GFR, Estimated >13 >08 mL/min    Comment: (NOTE) Calculated using the CKD-EPI Creatinine Equation (2021)    Anion gap 13 5 - 15    Comment: Performed at [facility], [address], [place], [place] [address]  Lipase, blood     Status: Abnormal   Collection Time: 10/12/22  5:49 PM  Result Value Ref Range   Lipase 317 (H) 11 - 51 U/L    Comment: Performed at [facility], [address] [place], [place] [address]  CBC with Diff     Status: Abnormal   Collection Time: 10/12/22  5:49 PM  Result Value Ref Range   WBC 8.0 4.0 - 10.5 K/uL   RBC 4.05 (L) 4.22 - 5.81 MIL/uL   Hemoglobin 13.7 13.0 - 17.0 g/dL    HCT 52.8 41.3 - 24.4 %   MCV 102.2 (H) 80.0 - 100.0 fL   MCH 33.8 26.0 - 34.0 pg   MCHC 33.1 30.0 - 36.0 g/dL   RDW 01.0 27.2 - 53.6 %   Platelets 194 150 - 400 K/uL   nRBC 0.0 0.0 - 0.2 %   Neutrophils Relative % 80 %   Neutro Abs 6.4 1.7 - 7.7 K/uL   Lymphocytes Relative 14 %   Lymphs Abs 1.1 0.7 - 4.0 K/uL   Monocytes Relative 5 %   Monocytes Absolute 0.4 0.1 - 1.0 K/uL   Eosinophils Relative 1 %   Eosinophils Absolute 0.1 0.0 - 0.5 K/uL   Basophils Relative 0 %  Basophils Absolute 0.0 0.0 - 0.1 K/uL   Immature Granulocytes 0 %   Abs Immature Granulocytes 0.03 0.00 - 0.07 K/uL    Comment: Performed at [facility], [address], [place], [place] [address]   No results found.  Pending Labs Unresulted Labs (From admission, onward)     Start     Ordered   10/19/22 0500  Creatinine, serum  (enoxaparin (LOVENOX)    CrCl >/= 30 ml/min)  Weekly,   R     Comments: while on enoxaparin therapy    10/12/22 2159   10/12/22 2159  CBC  (enoxaparin (LOVENOX)    CrCl >/= 30 ml/min)  Once,   R       Comments: Baseline for enoxaparin therapy IF NOT ALREADY DRAWN.  Notify MD if PLT < 100 K.    10/12/22 2159   10/12/22 2159  Creatinine, serum  (enoxaparin (LOVENOX)    CrCl >/= 30 ml/min)  Once,   R       Comments: Baseline for enoxaparin therapy IF NOT ALREADY DRAWN.    10/12/22 2159            Vitals/Pain Today's Vitals   10/12/22 1707 10/12/22 1930 10/12/22 1945 10/12/22 2015  BP:   (!) 149/90   Pulse:  73 75 82  Resp:  (!) 31 (!) 22 (!) 24  SpO2:  99% 100% 100%  Weight: 72.6 kg     Height: 6\' 1"  (1.854 m)     PainSc: 10-Worst pain ever       Isolation Precautions No active isolations  Medications Medications  pantoprazole (PROTONIX) EC tablet 40 mg (has no administration in time range)  enoxaparin (LOVENOX) injection 40 mg (has no administration in time range)  acetaminophen (TYLENOL) tablet 650 mg (has no administration in time range)    Or   acetaminophen (TYLENOL) suppository 650 mg (has no administration in time range)  HYDROcodone-acetaminophen (NORCO/VICODIN) 5-325 MG per tablet 1-2 tablet (has no administration in time range)  ondansetron (ZOFRAN) tablet 4 mg (has no administration in time range)    Or  ondansetron (ZOFRAN) injection 4 mg (has no administration in time range)  lactated ringers bolus 2,000 mL (has no administration in time range)  lactated ringers infusion (has no administration in time range)  morphine (PF) 2 MG/ML injection 2 mg (has no administration in time range)  promethazine (PHENERGAN) 25 mg in sodium chloride 0.9 % 50 mL IVPB (has no administration in time range)  HYDROmorphone (DILAUDID) injection 1 mg (1 mg Intravenous Given 10/12/22 1916)  sodium chloride 0.9 % bolus 1,000 mL (1,000 mLs Intravenous New Bag/Given 10/12/22 1945)  HYDROmorphone (DILAUDID) injection 1 mg (1 mg Intravenous Given 10/12/22 2047)    Mobility walks     Focused Assessments    R Recommendations: See Admitting Provider Note  Report given to:   Additional Notes:

## 2022-10-13 ENCOUNTER — Encounter (HOSPITAL_COMMUNITY): Payer: Self-pay | Admitting: Internal Medicine

## 2022-10-13 ENCOUNTER — Other Ambulatory Visit: Payer: Self-pay

## 2022-10-13 DIAGNOSIS — K861 Other chronic pancreatitis: Secondary | ICD-10-CM | POA: Diagnosis not present

## 2022-10-13 LAB — ETHANOL: Alcohol, Ethyl (B): <10 mg/dL (ref <10)

## 2022-10-13 MED ORDER — ORAL CARE MOUTH RINSE: 15.0000 mL | OROMUCOSAL | Status: DC | PRN

## 2022-10-13 MED ORDER — HYDRALAZINE HCL 20 MG/ML IJ SOLN: 10.0000 mg | INTRAMUSCULAR | Status: DC | PRN

## 2022-10-13 MED ORDER — HYDROMORPHONE HCL 1 MG/ML IJ SOLN
0.5000 mg | INTRAMUSCULAR | Status: DC | PRN
  Administered 2022-10-13 – 2022-10-22 (×45): 0.5 mg via INTRAVENOUS
  Filled 2022-10-13 (×46): qty 1

## 2022-10-13 NOTE — Progress Notes (Signed)
New Admission Note:   Arrival Method: Via Stretcher from ED Mental Orientation:  A & O x4 Telemetry: Box 5M18 - NSR Assessment: Completed Skin:  WNL IV:  RT AC PIV Pain: C/O Abd Pain 10/10 Tubes:  None Safety Measures: Safety Fall Prevention Plan has been given, discussed and signed Admission: Completed 5 MW Orientation: Patient has been orientated to the room, unit and staff.  Family:  None at bedside  Patient is from home with his fiancee.  The only valuables that he brought to the hospital was his cell phone.  He does have his clothes at the bedside.  What is important to him right now is for his pain and nausea to be controlled.  Orders have been reviewed and implemented. Will continue to monitor the patient. Call light has been placed within reach and bed alarm has been activated.   Bernie Covey RN Phone number: [phone]

## 2022-10-13 NOTE — Hospital Course (Signed)
52 y.o. male with medical history significant of gastroparesis, GERD, chronic pancreatitis on Creon who presented to the emergency department with recurrent abdominal pain.  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  He intermittently drinks alcohol however states it has been several weeks.  This afternoon he had intractable abdominal pain nausea and vomiting consistent with his prior pancreatitis flares.  He presented to the emergency department further assessment.  ER evaluation revealed elevated lipase.  Remaining labs showed bicarb 19, WBC 8.0, hemoglobin 13.7.  Patient just had a CT scan 2 weeks ago which showed acute pancreatitis after which she was discharged.  Patient was given IV fluids and admitted for further workup

## 2022-10-13 NOTE — Progress Notes (Signed)
  Progress Note   Patient: Kyle Berry:[id number] DOB: 1970-11-28 DOA: 10/12/2022     1 DOS: the patient was seen and examined on 10/13/2022   Brief hospital course: 52 y.o. male with medical history significant of gastroparesis, GERD, chronic pancreatitis on Creon who presented to the emergency department with recurrent abdominal pain.  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  He intermittently drinks alcohol however states it has been several weeks.  This afternoon he had intractable abdominal pain nausea and vomiting consistent with his prior pancreatitis flares.  He presented to the emergency department further assessment.  ER evaluation revealed elevated lipase.  Remaining labs showed bicarb 19, WBC 8.0, hemoglobin 13.7.  Patient just had a CT scan 2 weeks ago which showed acute pancreatitis after which she was discharged.  Patient was given IV fluids and admitted for further workup   Assessment and Plan: No notes have been filed under this hospital service. Service: Hospitalist     Acute on chronic idiopathic pancreatitis, POA, active -Presenting lipase in excess of 300 -Pt reportedly had not drank ETOH in around 2 weeks -etoh level <10 -Pt is followed by [facility] and had prior biliary stenting -Tolerating clear liquids. Will advanced to full liquids - Continue Creon supplementation - cont IVF hydration   Hypertension -ARB was on hold secondary to concerns of presenting pancreatits -BP suboptimally controlled at this time -will add hydralazine PRN for now   GERD -continue PPI    Subjective: Reports feeling better. Eager to advance diet  Physical Exam: Vitals:   10/12/22 2348 10/12/22 2350 10/13/22 0441 10/13/22 0819  BP:  (!) 190/101 (!) 180/98 (!) 158/94  Pulse:  68 68 68  Resp:  (!) 22 (!) 22 18  Temp:   97.8 F (36.6 C) 98.5 F (36.9 C)  TempSrc:   Oral   SpO2:  99% 99% 99%  Weight: 69.8 kg     Height: 6\' 1"   (1.854 m)      General exam: Awake, laying in bed, in nad Respiratory system: Normal respiratory effort, no wheezing Cardiovascular system: regular rate, s1, s2 Gastrointestinal system: Soft, nondistended, positive BS Central nervous system: CN2-12 grossly intact, strength intact Extremities: Perfused, no clubbing Skin: Normal skin turgor, no notable skin lesions seen Psychiatry: Mood normal // no visual hallucinations   Data Reviewed:  There are no new results to review at this time.  Family Communication: Pt in room, family not at bedside  Disposition: Status is: Inpatient Remains inpatient appropriate because: Severity of illness  Planned Discharge Destination: Home     Author: Rickey Barbara, MD 10/13/2022 5:02 PM  For on call review [email or web address].

## 2022-10-14 ENCOUNTER — Inpatient Hospital Stay (HOSPITAL_COMMUNITY): Payer: Managed Care, Other (non HMO)

## 2022-10-14 ENCOUNTER — Encounter (HOSPITAL_COMMUNITY): Payer: Self-pay

## 2022-10-14 DIAGNOSIS — K861 Other chronic pancreatitis: Secondary | ICD-10-CM | POA: Diagnosis not present

## 2022-10-14 LAB — COMPREHENSIVE METABOLIC PANEL
ALT: 19 U/L (ref 0–44)
AST: 30 U/L (ref 15–41)
Albumin: 3.3 g/dL — ABNORMAL LOW (ref 3.5–5.0)
Alkaline Phosphatase: 63 U/L (ref 38–126)
Anion gap: 9 (ref 5–15)
BUN: <5 mg/dL — ABNORMAL LOW (ref 6–20)
CO2: 25 mmol/L (ref 22–32)
Calcium: 8.9 mg/dL (ref 8.9–10.3)
Chloride: 100 mmol/L (ref 98–111)
Creatinine, Ser: 1 mg/dL (ref 0.61–1.24)
GFR, Estimated: >60 mL/min (ref >60)
Glucose, Bld: 118 mg/dL — ABNORMAL HIGH (ref 70–99)
Potassium: 4.2 mmol/L (ref 3.5–5.1)
Sodium: 134 mmol/L — ABNORMAL LOW (ref 135–145)
Total Bilirubin: 1.3 mg/dL — ABNORMAL HIGH (ref 0.3–1.2)
Total Protein: 6.2 g/dL — ABNORMAL LOW (ref 6.5–8.1)

## 2022-10-14 LAB — LIPASE, BLOOD: Lipase: 87 U/L — ABNORMAL HIGH (ref 11–51)

## 2022-10-14 MED ORDER — IOHEXOL 350 MG/ML SOLN
75.0000 mL | Freq: Once | INTRAVENOUS | Status: AC | PRN
  Administered 2022-10-14: 75 mL via INTRAVENOUS

## 2022-10-14 MED ORDER — IOHEXOL 350 MG/ML SOLN: 75.0000 mL | Freq: Once | INTRAVENOUS | Status: DC | PRN

## 2022-10-14 NOTE — Progress Notes (Signed)
  Progress Note   Patient: Kyle Berry ZOX:[id number] DOB: 02/24/1971 DOA: 10/12/2022     2 DOS: the patient was seen and examined on 10/14/2022   Brief hospital course: 52 y.o. male with medical history significant of gastroparesis, GERD, chronic pancreatitis on Creon who presented to the emergency department with recurrent abdominal pain.  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  He intermittently drinks alcohol however states it has been several weeks.  This afternoon he had intractable abdominal pain nausea and vomiting consistent with his prior pancreatitis flares.  He presented to the emergency department further assessment.  ER evaluation revealed elevated lipase.  Remaining labs showed bicarb 19, WBC 8.0, hemoglobin 13.7.  Patient just had a CT scan 2 weeks ago which showed acute pancreatitis after which she was discharged.  Patient was given IV fluids and admitted for further workup   Assessment and Plan: No notes have been filed under this hospital service. Service: Hospitalist     Acute on chronic idiopathic pancreatitis, POA, active -Presenting lipase in excess of 300 -Pt reportedly had not drank ETOH in around 2 weeks -etoh level <10 -Pt is followed by [facility] and had prior biliary stenting -Lipase down to 87, however pt continues with nausea and abd pain, unable to advance diet further -Downgrade diet to clears -Ordered and reviewed abd CT. Findings of mild improvement in acute pancreatitis. No evidence of pancreatic necrosis, pseudocyst, or other complication - Continue Creon supplementation - cont IVF hydration   Hypertension -ARB was on hold secondary to concerns of presenting pancreatits -BP suboptimally controlled at this time -will add hydralazine PRN for now   GERD -continue PPI    Subjective: Not tolerating trial of full liquids, now downgraded to clears again  Physical Exam: Vitals:   10/13/22 0819 10/13/22  1942 10/14/22 0627 10/14/22 0940  BP: (!) 158/94 (!) 143/93 (!) 149/94 (!) 158/96  Pulse: 68 86 71 98  Resp: 18 20 18 18   Temp: 98.5 F (36.9 C) 98.3 F (36.8 C) 97.7 F (36.5 C) 98.1 F (36.7 C)  TempSrc:  Oral Oral Oral  SpO2: 99% 100% 100% 97%  Weight:      Height:       General exam: Conversant, in no acute distress Respiratory system: normal chest rise, clear, no audible wheezing Cardiovascular system: regular rhythm, s1-s2 Gastrointestinal system: Nondistended, nontender, pos BS Central nervous system: No seizures, no tremors Extremities: No cyanosis, no joint deformities Skin: No rashes, no pallor Psychiatry: Affect normal // no auditory hallucinations   Data Reviewed:  There are no new results to review at this time.  Family Communication: Pt in room, family not at bedside  Disposition: Status is: Inpatient Remains inpatient appropriate because: Severity of illness  Planned Discharge Destination: Home     Author: Rickey Barbara, MD 10/14/2022 4:40 PM  For on call review [email or web address].

## 2022-10-15 DIAGNOSIS — K861 Other chronic pancreatitis: Secondary | ICD-10-CM | POA: Diagnosis not present

## 2022-10-15 LAB — COMPREHENSIVE METABOLIC PANEL
ALT: 18 U/L (ref 0–44)
AST: 21 U/L (ref 15–41)
Albumin: 3.1 g/dL — ABNORMAL LOW (ref 3.5–5.0)
Alkaline Phosphatase: 58 U/L (ref 38–126)
Anion gap: 8 (ref 5–15)
BUN: <5 mg/dL — ABNORMAL LOW (ref 6–20)
CO2: 29 mmol/L (ref 22–32)
Calcium: 8.9 mg/dL (ref 8.9–10.3)
Chloride: 99 mmol/L (ref 98–111)
Creatinine, Ser: 0.99 mg/dL (ref 0.61–1.24)
GFR, Estimated: >60 mL/min (ref >60)
Glucose, Bld: 109 mg/dL — ABNORMAL HIGH (ref 70–99)
Potassium: 3.8 mmol/L (ref 3.5–5.1)
Sodium: 136 mmol/L (ref 135–145)
Total Bilirubin: 0.8 mg/dL (ref 0.3–1.2)
Total Protein: 6.1 g/dL — ABNORMAL LOW (ref 6.5–8.1)

## 2022-10-15 LAB — CBC
HCT: 34.1 % — ABNORMAL LOW (ref 39.0–52.0)
Hemoglobin: 11.3 g/dL — ABNORMAL LOW (ref 13.0–17.0)
MCH: 34.5 pg — ABNORMAL HIGH (ref 26.0–34.0)
MCHC: 33.1 g/dL (ref 30.0–36.0)
MCV: 104 fL — ABNORMAL HIGH (ref 80.0–100.0)
Platelets: 133 10*3/uL — ABNORMAL LOW (ref 150–400)
RBC: 3.28 MIL/uL — ABNORMAL LOW (ref 4.22–5.81)
RDW: 14.9 % (ref 11.5–15.5)
WBC: 4.9 10*3/uL (ref 4.0–10.5)
nRBC: 0 % (ref 0.0–0.2)

## 2022-10-15 MED ORDER — SODIUM CHLORIDE 0.9 % IV SOLN
12.5000 mg | Freq: Four times a day (QID) | INTRAVENOUS | Status: DC | PRN
  Administered 2022-10-15 – 2022-10-23 (×9): 12.5 mg via INTRAVENOUS
  Filled 2022-10-15: qty 0.5
  Filled 2022-10-15: qty 12.5
  Filled 2022-10-15: qty 0.5
  Filled 2022-10-15 (×5): qty 12.5
  Filled 2022-10-15: qty 0.5

## 2022-10-15 NOTE — Progress Notes (Signed)
  Progress Note   Patient: Kyle Berry UEA:[id number] DOB: Feb 17, 1971 DOA: 10/12/2022     3 DOS: the patient was seen and examined on 10/15/2022   Brief hospital course: 52 y.o. male with medical history significant of gastroparesis, GERD, chronic pancreatitis on Creon who presented to the emergency department with recurrent abdominal pain.  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  He intermittently drinks alcohol however states it has been several weeks.  This afternoon he had intractable abdominal pain nausea and vomiting consistent with his prior pancreatitis flares.  He presented to the emergency department further assessment.  ER evaluation revealed elevated lipase.  Remaining labs showed bicarb 19, WBC 8.0, hemoglobin 13.7.  Patient just had a CT scan 2 weeks ago which showed acute pancreatitis after which she was discharged.  Patient was given IV fluids and admitted for further workup   Assessment and Plan: No notes have been filed under this hospital service. Service: Hospitalist     Acute on chronic idiopathic pancreatitis, POA, active -Presenting lipase in excess of 300 -Pt reportedly had not drank ETOH in around 2 weeks -etoh level <10 -Pt is followed by [facility] and had prior biliary stenting -Lipase down to 87 -Ordered and reviewed abd CT. Findings of mild improvement in acute pancreatitis. No evidence of pancreatic necrosis, pseudocyst, or other complication - Continue Creon supplementation - cont IVF hydration -slowly advancing diet, up to full liquid   Hypertension -ARB was on hold secondary to concerns of presenting pancreatits -BP suboptimally controlled at this time -will add hydralazine PRN for now   GERD -continue PPI    Subjective: complained of nausea, but reports feeling ready to advance diet this AM  Physical Exam: Vitals:   10/14/22 1641 10/14/22 2150 10/15/22 0753 10/15/22 1512  BP: (!) 170/95 (!) 177/98  (!) 163/97 (!) 155/109  Pulse: 72 68 69 71  Resp: 16 18 19 18   Temp: 98.3 F (36.8 C) 98.2 F (36.8 C) 98.4 F (36.9 C) 98.2 F (36.8 C)  TempSrc: Oral Oral Oral Oral  SpO2: 97% 98% 90% 98%  Weight:      Height:       General exam: Awake, laying in bed, in nad Respiratory system: Normal respiratory effort, no wheezing Cardiovascular system: regular rate, s1, s2 Gastrointestinal system: Soft, nondistended, positive BS Central nervous system: CN2-12 grossly intact, strength intact Extremities: Perfused, no clubbing Skin: Normal skin turgor, no notable skin lesions seen Psychiatry: Mood normal // no visual hallucinations   Data Reviewed:  Labs reviewed: Na 136, K 3.8, Cr 0.99, Hgb 11.3  Family Communication: Pt in room, family not at bedside  Disposition: Status is: Inpatient Remains inpatient appropriate because: Severity of illness  Planned Discharge Destination: Home     Author: Rickey Barbara, MD 10/15/2022 6:06 PM  For on call review [email or web address].

## 2022-10-16 DIAGNOSIS — K861 Other chronic pancreatitis: Secondary | ICD-10-CM | POA: Diagnosis not present

## 2022-10-16 DIAGNOSIS — E440 Moderate protein-calorie malnutrition: Secondary | ICD-10-CM | POA: Diagnosis present

## 2022-10-16 LAB — COMPREHENSIVE METABOLIC PANEL
ALT: 18 U/L (ref 0–44)
AST: 22 U/L (ref 15–41)
Albumin: 3.1 g/dL — ABNORMAL LOW (ref 3.5–5.0)
Alkaline Phosphatase: 56 U/L (ref 38–126)
Anion gap: 14 (ref 5–15)
BUN: <5 mg/dL — ABNORMAL LOW (ref 6–20)
CO2: 27 mmol/L (ref 22–32)
Calcium: 9.3 mg/dL (ref 8.9–10.3)
Chloride: 97 mmol/L — ABNORMAL LOW (ref 98–111)
Creatinine, Ser: 0.95 mg/dL (ref 0.61–1.24)
GFR, Estimated: >60 mL/min (ref >60)
Glucose, Bld: 103 mg/dL — ABNORMAL HIGH (ref 70–99)
Potassium: 3.8 mmol/L (ref 3.5–5.1)
Sodium: 138 mmol/L (ref 135–145)
Total Bilirubin: 0.3 mg/dL (ref 0.3–1.2)
Total Protein: 6.1 g/dL — ABNORMAL LOW (ref 6.5–8.1)

## 2022-10-16 LAB — CBC
HCT: 34.4 % — ABNORMAL LOW (ref 39.0–52.0)
Hemoglobin: 11.7 g/dL — ABNORMAL LOW (ref 13.0–17.0)
MCH: 34.9 pg — ABNORMAL HIGH (ref 26.0–34.0)
MCHC: 34 g/dL (ref 30.0–36.0)
MCV: 102.7 fL — ABNORMAL HIGH (ref 80.0–100.0)
Platelets: 143 10*3/uL — ABNORMAL LOW (ref 150–400)
RBC: 3.35 MIL/uL — ABNORMAL LOW (ref 4.22–5.81)
RDW: 15 % (ref 11.5–15.5)
WBC: 4.2 10*3/uL (ref 4.0–10.5)
nRBC: 0 % (ref 0.0–0.2)

## 2022-10-16 MED ORDER — PROSOURCE PLUS PO LIQD
30.0000 mL | Freq: Three times a day (TID) | ORAL | Status: DC
  Administered 2022-10-16 – 2022-10-23 (×19): 30 mL via ORAL
  Filled 2022-10-16 (×19): qty 30

## 2022-10-16 MED ORDER — BOOST / RESOURCE BREEZE PO LIQD CUSTOM
1.0000 | Freq: Three times a day (TID) | ORAL | Status: DC
  Administered 2022-10-16 – 2022-10-22 (×17): 1 via ORAL

## 2022-10-16 NOTE — Progress Notes (Signed)
Initial Nutrition Assessment  DOCUMENTATION CODES:   Non-severe (moderate) malnutrition in context of chronic illness  INTERVENTION:  Prosource Plus TID, each packet provides 60kcal and 15 g protein  Boost Breeze po TID, each supplement provides 250 kcal and 9 grams of protein  Education on TPN   If diet advances, please order Ensure Plus High Protein po TID, each supplement provides 350 kcal and 20 grams of protein.   NUTRITION DIAGNOSIS:   Moderate Malnutrition related to chronic illness as evidenced by moderate fat depletion, moderate muscle depletion, severe muscle depletion.   GOAL:   Patient will meet greater than or equal to 90% of their needs   MONITOR:   PO intake, Supplement acceptance, Labs, Weight trends, Skin, I & O's, Diet advancement  REASON FOR ASSESSMENT:   Consult Assessment of nutrition requirement/status  ASSESSMENT:  52 y.o. male with PMHx including gastroparesis, GERD, chronic pancreatitis on Creon who presents with abdominal pain consistent with a pancreatitis flare     Patient has had several previous hospitalizations for idiopathic chronic pancreatitis. Patient intermittently drinks alcohol but reports it has been weeks.   Followed by [facility] for biliary stent placement   Visited patient at bedside who reports he never recovered completely since the last time he was discharged 2 weeks ago. He reports following a low fat diet at baseline and now is trying to identify his trigger foods.   He reports compliance with Creon and that it does help. He states that his nausea never resolves and that he has been gradually losing weight. He reports weighing 165# not long ago and now he is in the 150s. Patient lives at home with his girlfriend who does the cooking and reports he hardly eats fried food and does not crave them.   Patient is familiar with ONS here and agreeable to drink them. RD informed him of the potential need for TPN to allow gut rest and he  is open to it. RD provided educational handout for him to better understand what it is.   Labs: Glu 103, BUN <5, lipase 87 Meds: creon TID, protonix, lactated ringers infusion     NUTRITION - FOCUSED PHYSICAL EXAM:  Flowsheet Row Most Recent Value  Orbital Region Moderate depletion  Upper Arm Region Mild depletion  Thoracic and Lumbar Region Moderate depletion  Buccal Region Moderate depletion  Temple Region Moderate depletion  Scapular Bone Region Unable to assess  Dorsal Hand Moderate depletion  Patellar Region Severe depletion  Anterior Thigh Region Severe depletion  Posterior Calf Region Severe depletion  Edema (RD Assessment) None  Hair Reviewed  Eyes Reviewed  Mouth Reviewed  Skin Reviewed  Nails Reviewed       Diet Order:   Diet Order             Diet clear liquid Room service appropriate? Yes; Fluid consistency: Thin  Diet effective now                   EDUCATION NEEDS:   Education needs have been addressed  Skin:  Skin Assessment: Reviewed RN Assessment  Last BM:  6/30  Height:   Ht Readings from Last 1 Encounters:  10/12/22 6\' 1"  (1.854 m)    Weight:   Wt Readings from Last 1 Encounters:  10/16/22 70.2 kg    Ideal Body Weight:     BMI:  Body mass index is 20.42 kg/m.  Estimated Nutritional Needs:   Kcal:  1800-2100  Protein:  85-105 g  Fluid:  > 1.8L    Leodis Rains, RDN, LDN  Clinical Nutrition

## 2022-10-16 NOTE — TOC CM/SW Note (Signed)
Transition of Care [facility]) - Inpatient Brief Assessment   Patient Details  Name: Kyle Berry MRN: [id number] Date of Birth: [birth year]  Transition [facility]) CM/SW Contact:    Tom-Johnson, Hershal Coria, RN Phone Number: 10/16/2022, 3:26 PM   Clinical Narrative:  Patient presented to the ED with intractable Abdominal pain, Nausea and Vomiting. Patient has Chronic Pancreatitis with frequent readmissions. Patient drinks alcohol, states he has not drank alcohol in a while.    Patient is from home with his mother. Independent with care and employed prior to admission.  PCP is at [facility] and could not remember the name. 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   No TOC needs or recommendations noted at this time. CM will continue to follow as patient progresses with care towards discharge.         Transition of Care Asessment: Insurance and Status: Insurance coverage has been reviewed Patient has primary care physician: Yes Home environment has been reviewed: Yes Prior level of function:: Independent Prior/Current Home Services: No current home services Social Determinants of Health Reivew: SDOH reviewed no interventions necessary Readmission risk has been reviewed: Yes Transition of care needs: no transition of care needs at this time

## 2022-10-16 NOTE — Progress Notes (Signed)
  Progress Note   Patient: Kyle Berry ZOX:[id number] DOB: 1970-10-28 DOA: 10/12/2022     4 DOS: the patient was seen and examined on 10/16/2022   Brief hospital course: 52 y.o. male with medical history significant of gastroparesis, GERD, chronic pancreatitis on Creon who presented to the emergency department with recurrent abdominal pain.  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  He intermittently drinks alcohol however states it has been several weeks.  This afternoon he had intractable abdominal pain nausea and vomiting consistent with his prior pancreatitis flares.  He presented to the emergency department further assessment.  ER evaluation revealed elevated lipase.  Remaining labs showed bicarb 19, WBC 8.0, hemoglobin 13.7.  Patient just had a CT scan 2 weeks ago which showed acute pancreatitis after which she was discharged.  Patient was given IV fluids and admitted for further workup   Assessment and Plan: No notes have been filed under this hospital service. Service: Hospitalist   Acute on chronic idiopathic pancreatitis, POA, active -Presenting lipase in excess of 300 -Pt reportedly had not drank ETOH in around 2 weeks -etoh level <10 -Pt is followed by [facility] and had prior biliary stenting -Lipase down to 87 -Ordered and reviewed abd CT. Findings of mild improvement in acute pancreatitis. No evidence of pancreatic necrosis, pseudocyst, or other complication - Continue Creon supplementation - cont IVF hydration -unable to advance diet past clears.  -Will consult GI for further recs -Will consult dietitian to follow along   Hypertension -ARB was on hold secondary to concerns of presenting pancreatits -BP suboptimally controlled at this time -will add hydralazine PRN for now   GERD -continue PPI   Moderate protein calorie malnutrition -Dietitian following  Subjective: Reports initially being able to tolerate some Full Liquids  yesterday, however towards evening, had increased nausea and abd pain. No longer able to tolerate full liquid  Physical Exam: Vitals:   10/15/22 2102 10/16/22 0454 10/16/22 0749 10/16/22 1505  BP: (!) 159/93 (!) 157/93 (!) 158/95 (!) 136/93  Pulse: 67 61 61 65  Resp: 18 19 19 19   Temp: 98.2 F (36.8 C) 97.9 F (36.6 C) 98 F (36.7 C) 98.2 F (36.8 C)  TempSrc: Oral Oral Oral Oral  SpO2: 100% 100% 99% 99%  Weight:  70.2 kg    Height:       General exam: Conversant, in no acute distress Respiratory system: normal chest rise, clear, no audible wheezing Cardiovascular system: regular rhythm, s1-s2 Gastrointestinal system: Nondistended, pos BS Central nervous system: No seizures, no tremors Extremities: No cyanosis, no joint deformities Skin: No rashes, no pallor Psychiatry: Affect normal // no auditory hallucinations   Data Reviewed:  Labs reviewed: Na 138, k 3.8, Cr 0.95, Hgb 11.7  Family Communication: Pt in room, family not at bedside  Disposition: Status is: Inpatient Remains inpatient appropriate because: Severity of illness  Planned Discharge Destination: Home     Author: Rickey Barbara, MD 10/16/2022 5:58 PM  For on call review [email or web address].

## 2022-10-17 DIAGNOSIS — I10 Essential (primary) hypertension: Secondary | ICD-10-CM | POA: Diagnosis not present

## 2022-10-17 DIAGNOSIS — K859 Acute pancreatitis without necrosis or infection, unspecified: Secondary | ICD-10-CM | POA: Diagnosis not present

## 2022-10-17 DIAGNOSIS — K860 Alcohol-induced chronic pancreatitis: Secondary | ICD-10-CM | POA: Diagnosis not present

## 2022-10-17 DIAGNOSIS — K861 Other chronic pancreatitis: Secondary | ICD-10-CM | POA: Diagnosis not present

## 2022-10-17 DIAGNOSIS — Z86010 Personal history of colonic polyps: Secondary | ICD-10-CM | POA: Diagnosis not present

## 2022-10-17 DIAGNOSIS — E440 Moderate protein-calorie malnutrition: Secondary | ICD-10-CM

## 2022-10-17 DIAGNOSIS — K219 Gastro-esophageal reflux disease without esophagitis: Secondary | ICD-10-CM | POA: Diagnosis not present

## 2022-10-17 LAB — CBC WITH DIFFERENTIAL/PLATELET
Abs Immature Granulocytes: 0.01 10*3/uL (ref 0.00–0.07)
Basophils Absolute: 0 10*3/uL (ref 0.0–0.1)
Basophils Relative: 1 %
Eosinophils Absolute: 0.1 10*3/uL (ref 0.0–0.5)
Eosinophils Relative: 3 %
HCT: 36.2 % — ABNORMAL LOW (ref 39.0–52.0)
Hemoglobin: 12 g/dL — ABNORMAL LOW (ref 13.0–17.0)
Immature Granulocytes: 0 %
Lymphocytes Relative: 39 %
Lymphs Abs: 1.7 10*3/uL (ref 0.7–4.0)
MCH: 33.9 pg (ref 26.0–34.0)
MCHC: 33.1 g/dL (ref 30.0–36.0)
MCV: 102.3 fL — ABNORMAL HIGH (ref 80.0–100.0)
Monocytes Absolute: 0.5 10*3/uL (ref 0.1–1.0)
Monocytes Relative: 10 %
Neutro Abs: 2 10*3/uL (ref 1.7–7.7)
Neutrophils Relative %: 47 %
Platelets: 144 10*3/uL — ABNORMAL LOW (ref 150–400)
RBC: 3.54 MIL/uL — ABNORMAL LOW (ref 4.22–5.81)
RDW: 14.9 % (ref 11.5–15.5)
WBC: 4.3 10*3/uL (ref 4.0–10.5)
nRBC: 0 % (ref 0.0–0.2)

## 2022-10-17 LAB — MAGNESIUM: Magnesium: 1.5 mg/dL — ABNORMAL LOW (ref 1.7–2.4)

## 2022-10-17 LAB — COMPREHENSIVE METABOLIC PANEL
ALT: 18 U/L (ref 0–44)
AST: 23 U/L (ref 15–41)
Albumin: 3.2 g/dL — ABNORMAL LOW (ref 3.5–5.0)
Alkaline Phosphatase: 57 U/L (ref 38–126)
Anion gap: 8 (ref 5–15)
BUN: <5 mg/dL — ABNORMAL LOW (ref 6–20)
CO2: 26 mmol/L (ref 22–32)
Calcium: 8.9 mg/dL (ref 8.9–10.3)
Chloride: 99 mmol/L (ref 98–111)
Creatinine, Ser: 1.08 mg/dL (ref 0.61–1.24)
GFR, Estimated: >60 mL/min (ref >60)
Glucose, Bld: 138 mg/dL — ABNORMAL HIGH (ref 70–99)
Potassium: 3.7 mmol/L (ref 3.5–5.1)
Sodium: 133 mmol/L — ABNORMAL LOW (ref 135–145)
Total Bilirubin: 0.7 mg/dL (ref 0.3–1.2)
Total Protein: 6.4 g/dL — ABNORMAL LOW (ref 6.5–8.1)

## 2022-10-17 LAB — LIPASE, BLOOD: Lipase: 49 U/L (ref 11–51)

## 2022-10-17 MED ORDER — CARVEDILOL 3.125 MG PO TABS
3.1250 mg | ORAL_TABLET | Freq: Two times a day (BID) | ORAL | Status: DC
  Administered 2022-10-17 – 2022-10-23 (×12): 3.125 mg via ORAL
  Filled 2022-10-17 (×12): qty 1

## 2022-10-17 MED ORDER — CARVEDILOL 3.125 MG PO TABS: 3.1250 mg | ORAL_TABLET | Freq: Two times a day (BID) | ORAL | Status: DC

## 2022-10-17 MED ORDER — MAGNESIUM SULFATE 4 GM/100ML IV SOLN
4.0000 g | Freq: Once | INTRAVENOUS | Status: AC
  Administered 2022-10-17: 4 g via INTRAVENOUS
  Filled 2022-10-17: qty 100

## 2022-10-17 NOTE — Consult Note (Addendum)
Referring Provider: Dr. Narda Rutherford  Primary Care Physician:  [facility] Primary Gastroenterologist: Dr. Vertell Novak at [facility]   Reason for Consultation: Acute on chronic pancreatitis  HPI: Kyle Berry is a 52 y.o. male with a past medical history of hypertension, GERD, colon polyps, hepatic steatosis and alcohol associated chronic pancreatitis. S/P cholecystectomy 2019.   He has a reported history of idiopathic chronic pancreatitis with a possible component of pancreatic divisum and subsequently thought to be alcohol related with multiple hospital admissions.  Previously followed by Dr. Lavon Paganini with [facility] gastroenterology then transitioned his GI management at [facility].   He was recently admitted to the hospital 09/24/2022 with N/V and abdominal pain which recurred after taking a few sips of wine. Labs in the ED showed an AST level of 50.  Total bili 2.0.  Lipase 177.  WBC 13.3.  Hemoglobin 13.4.  Ethyl alcohol  < 10.  Triglycerides 133.  HIV nonreactive.  CTAP showed hepatic steatosis and acute pancreatitis without any fluid collections or necrosis.  Lipase level was mildly elevated.  He received IV fluids, antiemetics and pain medication and his clinical status stabilized.  He was discharged home 09/28/2022.  He continued to have LUQ pain which progressively worsened therefore he presented back to the ED 10/12/2022.  Labs in the ED showed a total bilirubin level of 1.3.  AST 54.  Lipase 317.  WBC 8.0.  Hemoglobin 13.7.  MCV 102.2.  Platelet 194. Ethyl alcohol < 10.  CTAP with contrast showed mild improvement in acute pancreatitis when compared to prior CT. A GI consult was requested for further evaluation regarding acute on chronic pancreatitis, patient unable to advance diet due to persistent abdominal pain.  He has chronic epigastric and LUQ pain for which he takes oxycodone or tramadol 2-3 times daily most days.  Eating any food worsens his abdominal pain and  typically results in nausea and nonbloody emesis.  During this hospital admission, his abdominal pain somewhat improved and his diet was advanced from a clear to a full liquid diet few days ago which triggered N/V with worsening upper abdominal pain.  He remains on a clear liquid diet at this time.  Zofran is ineffective.  His nausea is somewhat relieved by Phenergan.  Patient with alcohol use disorder.  He endorsed drinking 4-5 beers daily for at least 10 years.  He stopped drinking any alcohol for 8 to 9 months then drink one half beer and a few sips of wine within the past month.  No alcohol since he was discharged from the hospital/14/2024.  He takes Creon 2 capsules 3 times daily and 1 capsule with snacks.  He remains on Pantoprazole 40 mg daily.  He denies having any heartburn.  He passes brown soft to loose stools most days.  No rectal bleeding or black stools.  He is followed by Dr. Normajean Glasgow GI at [facility]. He underwent an EUS 07/26/2020 which showed evidence of chronic pancreatitis and pancreatic divisum was not clearly visualized.  He underwent to ERCP 08/19/2020 which showed the upper third of the main bile duct was mildly dilated, biliary sphincterotomy was performed , 1 plastic stent was placed in the ventral pancreatic duct and 1 plastic stent was placed in the dorsal pancreatic duct.  He was admitted to the hospital with hematemesis and pancreatitis 10/2020. He underwent an EGD and ERCP by Dr. 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  At the time of his follow-up Dr. Normajean Glasgow 11/2021, he was concerned about possible alcohol related pancreatitis as his AST > ALT levels.  A Peth was ordered and was markedly  elevated at 1473 (assistant with heavy alcohol consumption).   GI PROCEDURES:  ERCP 11/02/2020 by Dr. Meridee Score:  - No gross lesions in esophagus. Z-line irregular, 43 cm from the incisors.  - Gastritis. Biopsied.  - Gastric polyps. Biopsied.  - Erythematous duodenopathy in bulb.  - Prior minor papilla sphincterotomy appeared open. One stent was seen in the minor papilla  - this was able to be removed.  - Prior biliary sphincterotomy appeared open.  - Filling defects consistent with mucin vs pancreatic stones from recent retained stent were seen on the pancreatogram.  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  - Pancreatic stones were found and removed through the minor duct. Complete removal was accomplished via balloon sweeps.  ERCP 08/19/2020 at [facility]: Impression: - The upper third of the main bile duct was mildly  dilated, acquired. - A biliary sphincterotomy was performed. - Ansa loop seen question possible incomplete divisum  as the etiology of pancreatitis pancreatic  sphincterotomy performed. - One plastic stent was placed into the ventral  pancreatic duct. - A minor papilla sphincterotomy was performed. - One plastic stent was placed into the dorsal  pancreatic duct.   EUS 07/26/2020:  Impression: EGD Impression: - Normal esophagus. - Normal stomach. - A single duodenal polyp. Resected and retrieved. EUS Impression: - Pancreatic parenchymal abnormalities consisting of  hyperechoic strands, hyperechoic foci and lobularity  were noted in the entire pancreas. It did appear as  though there was ongoing inflammation likely related  to his recent episode of pancreatitis. He would have 1  major A critiera (hyperechoic foci with shadowing) as  well as 3 minor features which would be consistent  with chronic pancreatitis per [place] criteria  however in the setting of active inflammation they are  less reliable. - Pancreas divisum was not clearly  visualized. - There was no sign of significant pathology in the  common bile duct and in the common hepatic duct. - No lymph nodes were visualized in the celiac region  (level 20), peripancreatic region and porta hepatis  region. - There was no evidence of significant pathology in  the left lobe of the liver. - Normal major papilla.   EGD 11/06/2019: 1. Mild retching gastropathy  2. Otherwise unremarkable EGD  3. No evidence for clinically significant GI bleeding  4. Posthemorrhagic anemia from recent post polypectomy bleed.   EGD 10/30/2019:  Erosive gastritis otherwise unremarkable  Colonoscopy 10/30/2019: 20 mm polyp removed with hot snare from cecum and 30 mm polyp removed with hot snare from rectosigmoid area that was clipped  1. Surgical [P], random gastric - MILD CHRONIC GASTRITIS. - WARTHIN-STARRY STAIN IS NEGATIVE FOR HELICOBACTER PYLORI. 2. Surgical [P], colon, cecal, polyp - TUBULAR ADENOMA. - NEGATIVE FOR HIGH GRADE DYSPLASIA. 3. Surgical [P], colon, transverse, polyp (2) - TUBULAR ADENOMA (X2). - NEGATIVE FOR HIGH GRADE DYSPLASIA. 4. Surgical [P], colon, rectosigmoid, polyp - TUBULAR ADENOMA. - NEGATIVE FOR HIGH GRADE DYSPLASIA.   Colonoscopy November 02, 2019 as an inpatient secondary to post polypectomy bleed:   possible site of post polypectomy bleed from cecum, additional  clips placed.  No further bleeding.  GI IMAGE STUDIES:  Gastric emptying study July 04, 2020: Solid gastric emptying at 59 minutes is 19% (normal range at 60 minutes is>10%).  Solid gastric emptying at 121 minutes is 38% (normal range at 120 minutes is >40%).  Solid gastric emptying at 242 minutes is 63% (normal range at 240 minutes is >90%).  bdominal MRI/MRCP 01/02/2022:  Abdominal MRI/MRCP 11/2021:  The main pancreatic duct and common bile duct appear to insert at the minor  papilla. No biliary or main pancreatic duct dilation.   Past Medical History:  Diagnosis Date   Atrophy of right kidney     Chronic bronchitis (HCC)    Chronic lower back pain    Chronic pancreatitis (HCC)    Gastroparesis 2020-07-04   confirmed by emptying study at [facility]   GERD (gastroesophageal reflux disease)    Headache    "once/month" (05/13/2017)   High cholesterol    Hypertension    Migraine    "a couple/year" (05/13/2017)   Nephrolithiasis 05/13/2017   Pancreatitis    Pneumonia ~ 09/2015   Presence of pancreatic duct stent    Recurrent acute pancreatitis    Shortness of breath 05/22/2021    Past Surgical History:  Procedure Laterality Date   BIOPSY  11/02/2020   Procedure: BIOPSY;  Surgeon: [name]., MD;  Location: [facility] ENDOSCOPY;  Service: Gastroenterology;;   CHOLECYSTECTOMY N/A 10/23/2017   Procedure: LAPAROSCOPIC CHOLECYSTECTOMY WITH INTRAOPERATIVE CHOLANGIOGRAM;  Surgeon: Luretha Murphy, MD;  Location: [facility] ORS;  Service: General;  Laterality: N/A;   COLONOSCOPY WITH PROPOFOL N/A 11/02/2019   Procedure: COLONOSCOPY WITH PROPOFOL;  Surgeon: Hilarie Fredrickson, MD;  Location[facility] ENDOSCOPY;  Service: Endoscopy;  Laterality: N/A;   ERCP N/A 11/02/2020   Procedure: ENDOSCOPIC RETROGRADE CHOLANGIOPANCREATOGRAPHY (ERCP);  Surgeon: [name]., MD;  Location: [facility] ENDOSCOPY;  Service: Gastroenterology;  Laterality: N/A;   ESOPHAGOGASTRODUODENOSCOPY N/A 11/02/2020   Procedure: ESOPHAGOGASTRODUODENOSCOPY (EGD);  Surgeon: [name]., MD;  Location: [facility] ENDOSCOPY;  Service: Gastroenterology;  Laterality: N/A;   ESOPHAGOGASTRODUODENOSCOPY (EGD) WITH PROPOFOL N/A 11/06/2019   Procedure: ESOPHAGOGASTRODUODENOSCOPY (EGD) WITH PROPOFOL;  Surgeon: Hilarie Fredrickson, MD;  Location: [facility] ENDOSCOPY;  Service: Endoscopy;  Laterality: N/A;   HEMOSTASIS CLIP PLACEMENT  11/02/2019   Procedure: HEMOSTASIS CLIP PLACEMENT;  Surgeon: Hilarie Fredrickson, MD;  Location: [facility] ENDOSCOPY;  Service: Endoscopy;;   NO PAST SURGERIES     REMOVAL OF STONES  11/02/2020   Procedure: REMOVAL OF STONES;  Surgeon: [name]., MD;  Location: [facility] ENDOSCOPY;  Service: Gastroenterology;;   Francine Graven REMOVAL  11/02/2020   Procedure: STENT REMOVAL;  Surgeon: [name]., MD;  Location: [name] ENDOSCOPY;  Service: Gastroenterology;;    Prior to Admission medications   Medication Sig Start Date End Date Taking? Authorizing Provider  acetaminophen (TYLENOL) 325 MG tablet Take 650 mg by mouth daily as needed for moderate pain, headache or fever.   Yes [provider]  albuterol (VENTOLIN HFA) 108 (90 Base) MCG/ACT inhaler Inhale 2 puffs into the lungs every 6 (six) hours as needed for wheezing or shortness of breath.   Yes [provider]  famotidine (PEPCID) 20 MG tablet Take 1 tablet (20 mg total) by mouth 2 (two) times daily. 09/28/22 03/27/23 Yes Burnadette Pop, MD  folic acid (FOLVITE) 1 MG tablet Take 1 tablet (1 mg total) by mouth daily. 09/29/22  Yes Burnadette Pop, MD  gabapentin (NEURONTIN) 100 MG capsule Take 1 capsule (100 mg total) by  mouth 3 (three) times daily. 10/27/21  Yes Rolly Salter, MD  naloxone [facility]) nasal spray 4 mg/0.1 mL Place 1 spray into the nose daily as needed (opioid reversal). 07/16/22  Yes [provider]  Naphazoline-Pheniramine (VISINE-A OP) Place 2 drops into both eyes daily as needed (itching, irritation).   Yes [provider]  ondansetron (ZOFRAN) 4 MG tablet Take 1 tablet (4 mg total) by mouth every 6 (six) hours as needed for nausea. 05/11/22  Yes Kathlen Mody, MD  Pancrelipase, Lip-Prot-Amyl, 24000-76000 units CPEP Take 1 capsule (24,000 Units total) by mouth 3 (three) times daily with meals. 09/28/22  Yes Burnadette Pop, MD  promethazine (PHENERGAN) 25 MG tablet Take 25 mg by mouth every 6 (six) hours as needed for nausea or vomiting.   Yes [provider]  telmisartan (MICARDIS) 40 MG tablet Take 1 tablet (40 mg total) by mouth daily. 03/07/22 05/06/23 Yes Meredeth Ide, MD  pantoprazole (PROTONIX) 40 MG tablet Take 1 tablet  (40 mg total) by mouth daily at 6 (six) AM. Patient not taking: Reported on 10/14/2022 05/12/22   Kathlen Mody, MD  thiamine (VITAMIN B1) 100 MG tablet Take 1 tablet (100 mg total) by mouth daily. Patient not taking: Reported on 10/14/2022 09/29/22   Burnadette Pop, MD  traMADol (ULTRAM) 50 MG tablet Take 50 mg by mouth every 8 (eight) hours as needed for severe pain. Patient not taking: Reported on 10/14/2022    [provider]    Current Facility-Administered Medications  Medication Dose Route Frequency Provider Last Rate Last Admin   (feeding supplement) PROSource Plus liquid 30 mL  30 mL Oral TID BM Jerald Kief, MD   30 mL at 10/17/22 1019   acetaminophen (TYLENOL) tablet 650 mg  650 mg Oral Q6H PRN Alan Mulder, MD       Or   acetaminophen (TYLENOL) suppository 650 mg  650 mg Rectal Q6H PRN Dorrell, Robert, MD       enoxaparin (LOVENOX) injection 40 mg  40 mg Subcutaneous Q24H Alan Mulder, MD   40 mg at 10/17/22 1020   feeding supplement (BOOST / RESOURCE BREEZE) liquid 1 Container  1 Container Oral TID BM Jerald Kief, MD   1 Container at 10/17/22 1020   gabapentin (NEURONTIN) capsule 300 mg  300 mg Oral TID Alan Mulder, MD   300 mg at 10/17/22 1020   hydrALAZINE (APRESOLINE) injection 10 mg  10 mg Intravenous Q4H PRN Jerald Kief, MD       HYDROcodone-acetaminophen (NORCO/VICODIN) 5-325 MG per tablet 1-2 tablet  1-2 tablet Oral Q4H PRN Alan Mulder, MD   2 tablet at 10/17/22 0831   HYDROmorphone (DILAUDID) injection 0.5 mg  0.5 mg Intravenous Q2H PRN Opyd, Lavone Neri, MD   0.5 mg at 10/17/22 1158   lactated ringers infusion   Intravenous Continuous Alan Mulder, MD 200 mL/hr at 10/17/22 0721 New Bag at 10/17/22 0721   lipase/protease/amylase (CREON) capsule 24,000 Units  24,000 Units Oral TID WC Alan Mulder, MD   24,000 Units at 10/17/22 1158   Oral care mouth rinse  15 mL Mouth Rinse PRN Dorrell, Molly Maduro, MD       pantoprazole (PROTONIX) EC tablet 40  mg  40 mg Oral Daily Dorrell, Molly Maduro, MD   40 mg at 10/17/22 1020   promethazine (PHENERGAN) 12.5 mg in sodium chloride 0.9 % 50 mL IVPB  12.5 mg Intravenous Q6H PRN Opyd, Lavone Neri, MD 200 mL/hr at 10/16/22 1036 12.5 mg  at 10/16/22 1036    Allergies as of 10/12/2022 - Review Complete 10/12/2022  Allergen Reaction Noted   Norvasc [amlodipine besylate] Other (See Comments) 08/15/2016   Voltaren [diclofenac] Hives and Other (See Comments) 12/14/2013   Prilosec [omeprazole] Other (See Comments) 08/15/2016   Hydrochlorothiazide Itching 09/19/2021   Ultram [tramadol] Other (See Comments) 09/24/2022   Prednisone Hives and Other (See Comments) 12/14/2013    Family History  Problem Relation Age of Onset   Hypertension Other    Hypertension Mother    Hypertension Father    Kidney disease Father    Hypertension Sister    Diabetes Sister    Hypertension Brother    Pancreatic cancer Paternal Grandmother    Colon cancer Cousin    Stomach cancer Neg Hx    Esophageal cancer Neg Hx     Social History   Socioeconomic History   Marital status: Single    Spouse name: Not on file   Number of children: Not on file   Years of education: Not on file   Highest education level: Not on file  Occupational History   Occupation: Horticulturist, commercial  Tobacco Use   Smoking status: Never   Smokeless tobacco: Never  Vaping Use   Vaping Use: Never used  Substance and Sexual Activity   Alcohol use: Not Currently    Comment: remote h/o heavy use   Drug use: No   Sexual activity: Yes    Partners: Female    Birth control/protection: Condom  Other Topics Concern   Not on file  Social History Narrative   Not on file   Social Determinants of Health   Financial Resource Strain: Not on file  Food Insecurity: No Food Insecurity (10/13/2022)   Hunger Vital Sign    Worried About Running Out of Food in the Last Year: Never true    Ran Out of Food in the Last Year: Never true  Transportation Needs: No  Transportation Needs (10/13/2022)   PRAPARE - Administrator, Civil Service (Medical): No    Lack of Transportation (Non-Medical): No  Physical Activity: Not on file  Stress: Not on file  Social Connections: Not on file  Intimate Partner Violence: Not At Risk (10/13/2022)   Humiliation, Afraid, Rape, and Kick questionnaire    Fear of Current or Ex-Partner: No    Emotionally Abused: No    Physically Abused: No    Sexually Abused: No    Review of Systems: Gen: + weight loss.Denies fever, sweats or chills.  CV: Denies chest pain, palpitations or edema. Resp: Denies cough, shortness of breath of hemoptysis.  GI:See HPI. GU : Denies urinary burning, blood in urine, increased urinary frequency or incontinence.  Patient stated he has 1 functioning kidney. MS: Denies joint pain, muscles aches or weakness. Derm: Denies rash, itchiness, skin lesions or unhealing ulcers. Psych: Denies depression, anxiety, memory loss or confusion. Heme: Denies easy bruising, bleeding. Neuro:  Denies headaches, dizziness or paresthesias. Endo:  Denies any problems with DM, thyroid or adrenal function.  Physical Exam: Vital signs in last 24 hours: Temp:  [97.5 F (36.4 C)-98.5 F (36.9 C)] 97.8 F (36.6 C) (07/03 0829) Pulse Rate:  [64-67] 65 (07/03 0829) Resp:  [17-19] 19 (07/03 0829) BP: (136-163)/(91-101) 163/91 (07/03 0829) SpO2:  [92 %-100 %] 92 % (07/03 0829) Last BM Date : 10/16/22 General: 52 year old male in no acute distress. Head:  Normocephalic and atraumatic. Eyes:  No scleral icterus. Conjunctiva pink. Ears:  Normal auditory acuity.  Nose:  No deformity, discharge or lesions. Mouth:  Dentition intact. No ulcers or lesions.  Neck:  Supple. No lymphadenopathy or thyromegaly.  Lungs: Breath sounds clear throughout. No wheezes, rhonchi or crackles.  Heart: Rate and rhythm, no murmurs. Abdomen:, Nondistended.  Moderate tenderness to the epigastric and LUQ without rebound or  guarding.  Positive bowel sounds to all 4 quadrants.  No bruit.  No palpable mass. Rectal: Deferred. Musculoskeletal:  Symmetrical without gross deformities.  Pulses:  Normal pulses noted. Extremities:  Without clubbing or edema. Neurologic:  Alert and  oriented x 4. No focal deficits.  Skin:  Intact without significant lesions or rashes. Psych:  Alert and cooperative. Normal mood and affect.  Intake/Output from previous day: 07/02 0701 - 07/03 0700 In: 680 [P.O.:680] Out: 1200 [Urine:1200] Intake/Output this shift: Total I/O In: 10111.8 [P.O.:480; I.V.:9531.8; IV Piggyback:100] Out: -   Lab Results: Recent Labs    10/15/22 0200 10/16/22 0334 10/17/22 1117  WBC 4.9 4.2 4.3  HGB 11.3* 11.7* 12.0*  HCT 34.1* 34.4* 36.2*  PLT 133* 143* 144*   BMET Recent Labs    10/15/22 0200 10/16/22 0334 10/17/22 1117  NA 136 138 133*  K 3.8 3.8 3.7  CL 99 97* 99  CO2 29 27 26   GLUCOSE 109* 103* 138*  BUN <5* <5* <5*  CREATININE 0.99 0.95 1.08  CALCIUM 8.9 9.3 8.9   LFT Recent Labs    10/17/22 1117  PROT 6.4*  ALBUMIN 3.2*  AST 23  ALT 18  ALKPHOS 57  BILITOT 0.7   PT/INR No results for input(s): "LABPROT", "INR" in the last 72 hours. Hepatitis Panel No results for input(s): "HEPBSAG", "HCVAB", "HEPAIGM", "HEPBIGM" in the last 72 hours.    Studies/Results: No results found.  IMPRESSION/PLAN:  52 year old male readmitted to the hospital with acute on chronic pancreatitis, likely alcohol associated +/- pancreatic divisum. Patient unable to advance diet, developed worsening upper abdominal pain with nausea and vomiting on a full liquid diet.  CTAP 6/28 showed improvement regarding acute pancreatitis without evidence of pancreatic pseudocyst or necrosis.  AST 23.  ALT 18.  Alk phos 57.  Total bili 0.7.  Lipase 49. -Clear liquid diet -Hospitalist considering initiation of TPN -Pain management per the hospitalist -Phenergan 12.5 mg p.o. or IV every 6 hours as  needed -Continue Pantoprazole 40 mg daily -Continue Creon 24K 2 capsules po tid  -Patient counseled no alcohol ever -Dr. Meridee Score to review case to determine if patient is a candidate for a potential celiac plexus block  -Patient to follow-up with [facility] posthospital discharge  History of tubular adenomatous polyps per colonoscopy 10/2019 -Colon polyp surveillance colonoscopy due 10/2022.  Follow-up with [facility] to determine timing of next colonoscopy.         Kyle Berry  10/17/2022, 1:35PM

## 2022-10-17 NOTE — Progress Notes (Addendum)
PROGRESS NOTE    GARNETT GOFF  ZOX:[id number] DOB: 1970/06/16 DOA: 10/12/2022 PCP: [facility]    Chief Complaint  Patient presents with   Abdominal Pain    Brief Narrative:  52 y.o. male with medical history significant of gastroparesis, GERD, chronic pancreatitis on Creon who presented to the emergency department with recurrent abdominal pain.  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  He intermittently drinks alcohol however states it has been several weeks.  This afternoon he had intractable abdominal pain nausea and vomiting consistent with his prior pancreatitis flares.  He presented to the emergency department further assessment.  ER evaluation revealed elevated lipase.  Remaining labs showed bicarb 19, WBC 8.0, hemoglobin 13.7.  Patient just had a CT scan 2 weeks ago which showed acute pancreatitis after which she was discharged.  Patient was given IV fluids and admitted for further workup      Assessment & Plan:   Principal Problem:   Acute on chronic pancreatitis (HCC) Active Problems:   Essential hypertension   Esophageal reflux   Malnutrition of moderate degree  #1 acute on chronic idiopathic pancreatitis, POA -Patient presented with worsening abdominal pain nausea and vomiting consistent with symptoms from prior pancreatitis flares. -Lipase levels noted on admission elevated at 317. -Patient of present patient had not drank alcohol in approximately 2 weeks. -Alcohol level on admission < 10. -Patient noted to be followed by [facility] and had prior biliary stenting placed with a history of pancreatic divisum. -Lipase levels trending down currently at 49 from 87 from 317. -CT abdomen and pelvis with findings of mild improvement of acute pancreatitis with no evidence of pancreatic necrosis, pseudocyst or other complication. -Patient currently on clear liquids and when diet advanced to a full liquid  diet unable to tolerate with worsening abdominal pain and nausea. -Continue IV fluids, continue Creon supplementation. -GI consulted for further evaluation and recommendations.  2.  Hypertension -Due to her being on an ARB prior to admission which was on hold secondary to concerns of presentation with acute pancreatitis. -Start Coreg 3.125 mg twice daily.  3.  GERD -PPI.  4.  Moderate protein calorie malnutrition -Dietitian following.  5.  Hypomagnesemia -Magnesium at 1.5. -Magnesium sulfate 4 g IV x 1. -Repeat labs in the AM.   DVT prophylaxis: Lovenox Code Status: FullFull Family Communication: Updated patient.  No family at bedside. Disposition: Home when clinically improved, tolerating diet, and cleared by GI.  Status is: Inpatient Remains inpatient appropriate because: Severity of illness   Consultants:  Gastroenterology: 10/17/2022  Procedures:  CT abdomen and pelvis 10/14/2022   Antimicrobials:  Anti-infectives (From admission, onward)    None         Subjective: Patient laying in bed.  Denies any chest pain.  No shortness of breath.  Still with complaints of upper abdominal pain.  Still with some nausea.  Currently tolerating clear liquids.  Was unable to advance past full liquids.  Objective: Vitals:   10/17/22 0829 10/17/22 1323 10/17/22 1635 10/17/22 1937  BP: (!) 163/91 (!) 143/91 (!) 133/94 (!) 144/96  Pulse: 65 69 (!) 59 64  Resp: 19 18  19   Temp: 97.8 F (36.6 C) 98.1 F (36.7 C) 97.6 F (36.4 C) 97.8 F (36.6 C)  TempSrc: Oral Oral Oral Oral  SpO2: 92% 99% 99% 100%  Weight:      Height:        Intake/Output Summary (Last 24 hours)  at 10/17/2022 2049 Last data filed at 10/17/2022 1545 Gross per 24 hour  Intake 12001.31 ml  Output 1300 ml  Net 10701.31 ml   Filed Weights   10/12/22 1707 10/12/22 2348 10/16/22 0454  Weight: 72.6 kg 69.8 kg 70.2 kg    Examination:  General exam: Appears calm and comfortable  Respiratory system: Clear  to auscultation. Respiratory effort normal. Cardiovascular system: S1 & S2 heard, RRR. No JVD, murmurs, rubs, gallops or clicks. No pedal edema. Gastrointestinal system: Abdomen is nondistended, soft and tender to palpation in the epigastrium and upper abdominal region.  Positive bowel sounds.  No rebound.  No guarding.  Central nervous system: Alert and oriented. No focal neurological deficits. Extremities: Symmetric 5 x 5 power. Skin: No rashes, lesions or ulcers Psychiatry: Judgement and insight appear normal. Mood & affect appropriate.     Data Reviewed: I have personally reviewed following labs and imaging studies  CBC: Recent Labs  Lab 10/12/22 1749 10/15/22 0200 10/16/22 0334 10/17/22 1117  WBC 8.0 4.9 4.2 4.3  NEUTROABS 6.4  --   --  2.0  HGB 13.7 11.3* 11.7* 12.0*  HCT 41.4 34.1* 34.4* 36.2*  MCV 102.2* 104.0* 102.7* 102.3*  PLT 194 133* 143* 144*    Basic Metabolic Panel: Recent Labs  Lab 10/12/22 1749 10/14/22 0134 10/15/22 0200 10/16/22 0334 10/17/22 1117  NA 137 134* 136 138 133*  K 3.6 4.2 3.8 3.8 3.7  CL 105 100 99 97* 99  CO2 19* 25 29 27 26   GLUCOSE 125* 118* 109* 103* 138*  BUN <5* <5* <5* <5* <5*  CREATININE 1.20 1.00 0.99 0.95 1.08  CALCIUM 9.1 8.9 8.9 9.3 8.9  MG  --   --   --   --  1.5*    GFR: Estimated Creatinine Clearance: 78.5 mL/min (by C-G formula based on SCr of 1.08 mg/dL).  Liver Function Tests: Recent Labs  Lab 10/12/22 1749 10/14/22 0134 10/15/22 0200 10/16/22 0334 10/17/22 1117  AST 54* 30 21 22 23   ALT 32 19 18 18 18   ALKPHOS 87 63 58 56 57  BILITOT 1.3* 1.3* 0.8 0.3 0.7  PROT 7.2 6.2* 6.1* 6.1* 6.4*  ALBUMIN 4.0 3.3* 3.1* 3.1* 3.2*    CBG: No results for input(s): "GLUCAP" in the last 168 hours.   No results found for this or any previous visit (from the past 240 hour(s)).       Radiology Studies: No results found.      Scheduled Meds:  (feeding supplement) PROSource Plus  30 mL Oral TID BM    carvedilol  3.125 mg Oral BID WC   enoxaparin (LOVENOX) injection  40 mg Subcutaneous Q24H   feeding supplement  1 Container Oral TID BM   gabapentin  300 mg Oral TID   lipase/protease/amylase  24,000 Units Oral TID WC   pantoprazole  40 mg Oral Daily   Continuous Infusions:  lactated ringers 200 mL/hr at 10/17/22 2047   promethazine (PHENERGAN) injection (IM or IVPB) 12.5 mg (10/17/22 1255)     LOS: 5 days    Time spent: 35 minutes    Ramiro Harvest, MD [facility]   To contact the attending provider between 7A-7P or the covering provider during after hours 7P-7A, please log into the web site [email or web address] and access using universal [place] password for that web site. If you do not have the password, please call the hospital operator.  10/17/2022, 8:49 PM

## 2022-10-18 ENCOUNTER — Inpatient Hospital Stay (HOSPITAL_COMMUNITY): Payer: Managed Care, Other (non HMO) | Admitting: Anesthesiology

## 2022-10-18 DIAGNOSIS — I10 Essential (primary) hypertension: Secondary | ICD-10-CM | POA: Diagnosis not present

## 2022-10-18 DIAGNOSIS — G8929 Other chronic pain: Secondary | ICD-10-CM

## 2022-10-18 DIAGNOSIS — Z86010 Personal history of colonic polyps: Secondary | ICD-10-CM | POA: Diagnosis not present

## 2022-10-18 DIAGNOSIS — K861 Other chronic pancreatitis: Secondary | ICD-10-CM | POA: Diagnosis not present

## 2022-10-18 DIAGNOSIS — K219 Gastro-esophageal reflux disease without esophagitis: Secondary | ICD-10-CM | POA: Diagnosis not present

## 2022-10-18 DIAGNOSIS — E441 Mild protein-calorie malnutrition: Secondary | ICD-10-CM

## 2022-10-18 DIAGNOSIS — K860 Alcohol-induced chronic pancreatitis: Secondary | ICD-10-CM | POA: Diagnosis not present

## 2022-10-18 DIAGNOSIS — K859 Acute pancreatitis without necrosis or infection, unspecified: Secondary | ICD-10-CM | POA: Diagnosis not present

## 2022-10-18 LAB — COMPREHENSIVE METABOLIC PANEL
ALT: 16 U/L (ref 0–44)
AST: 19 U/L (ref 15–41)
Albumin: 3.1 g/dL — ABNORMAL LOW (ref 3.5–5.0)
Alkaline Phosphatase: 47 U/L (ref 38–126)
Anion gap: 8 (ref 5–15)
BUN: 5 mg/dL — ABNORMAL LOW (ref 6–20)
CO2: 28 mmol/L (ref 22–32)
Calcium: 8.7 mg/dL — ABNORMAL LOW (ref 8.9–10.3)
Chloride: 102 mmol/L (ref 98–111)
Creatinine, Ser: 1.02 mg/dL (ref 0.61–1.24)
GFR, Estimated: >60 mL/min (ref >60)
Glucose, Bld: 112 mg/dL — ABNORMAL HIGH (ref 70–99)
Potassium: 3.8 mmol/L (ref 3.5–5.1)
Sodium: 138 mmol/L (ref 135–145)
Total Bilirubin: 0.7 mg/dL (ref 0.3–1.2)
Total Protein: 6.1 g/dL — ABNORMAL LOW (ref 6.5–8.1)

## 2022-10-18 LAB — CBC
HCT: 33.9 % — ABNORMAL LOW (ref 39.0–52.0)
Hemoglobin: 11.4 g/dL — ABNORMAL LOW (ref 13.0–17.0)
MCH: 34.1 pg — ABNORMAL HIGH (ref 26.0–34.0)
MCHC: 33.6 g/dL (ref 30.0–36.0)
MCV: 101.5 fL — ABNORMAL HIGH (ref 80.0–100.0)
Platelets: 141 10*3/uL — ABNORMAL LOW (ref 150–400)
RBC: 3.34 MIL/uL — ABNORMAL LOW (ref 4.22–5.81)
RDW: 14.9 % (ref 11.5–15.5)
WBC: 4 10*3/uL (ref 4.0–10.5)
nRBC: 0 % (ref 0.0–0.2)

## 2022-10-18 LAB — MAGNESIUM: Magnesium: 2 mg/dL (ref 1.7–2.4)

## 2022-10-18 MED ORDER — SODIUM CHLORIDE 0.9 % IV SOLN: INTRAVENOUS | Status: DC

## 2022-10-18 NOTE — Progress Notes (Signed)
Gastroenterology Inpatient Follow-up Note   PATIENT IDENTIFICATION  Kyle Berry is a 52 y.o. male Hospital Day: 7  SUBJECTIVE  The patient's chart has been reviewed. The patient's labs have been reviewed. Patient still experiencing significant abdominal discomfort (rated 6-7 out of 10) in the mid epigastrium/upper abdomen. The patient denies fevers or chills. He is still interested in moving forward with celiac nerve block attempt to see if that will help him with his pain. Patient does have an appetite and would like to see if he can advance his diet today.   OBJECTIVE  Scheduled Inpatient Medications:   (feeding supplement) PROSource Plus  30 mL Oral TID BM   carvedilol  3.125 mg Oral BID WC   enoxaparin (LOVENOX) injection  40 mg Subcutaneous Q24H   feeding supplement  1 Container Oral TID BM   gabapentin  300 mg Oral TID   lipase/protease/amylase  24,000 Units Oral TID WC   pantoprazole  40 mg Oral Daily   Continuous Inpatient Infusions:   lactated ringers 200 mL/hr at 10/18/22 0554   promethazine (PHENERGAN) injection (IM or IVPB) 12.5 mg (10/18/22 0137)   PRN Inpatient Medications: acetaminophen **OR** acetaminophen, hydrALAZINE, HYDROcodone-acetaminophen, HYDROmorphone (DILAUDID) injection, mouth rinse, promethazine (PHENERGAN) injection (IM or IVPB)   Physical Examination  Temp:  [97.5 F (36.4 C)-98.2 F (36.8 C)] 98.2 F (36.8 C) (07/04 0817) Pulse Rate:  [59-69] 61 (07/04 0817) Resp:  [18-19] 19 (07/04 0817) BP: (133-148)/(89-96) 141/89 (07/04 0817) SpO2:  [96 %-100 %] 100 % (07/04 0817) Temp (24hrs), Avg:97.8 F (36.6 C), Min:97.5 F (36.4 C), Max:98.2 F (36.8 C)  Weight: 70.2 kg GEN: NAD, appears stated age, doesn't appear chronically ill PSYCH: Cooperative, without pressured speech EYE: Conjunctivae pink, sclerae anicteric ENT: MMM CV: Nontachycardic RESP: No audible wheezing GI: NABS, soft, rounded, TTP in MEG upon light and deep  palpation, volitional guarding present, no rebound MSK/EXT: No significant lower extremity edema SKIN: No jaundice NEURO:  Alert & Oriented x 3, no focal deficits   Review of Data   Laboratory Studies   Recent Labs  Lab 10/18/22 0410  NA 138  K 3.8  CL 102  CO2 28  BUN 5*  CREATININE 1.02  GLUCOSE 112*  CALCIUM 8.7*  MG 2.0   Recent Labs  Lab 10/18/22 0410  AST 19  ALT 16  ALKPHOS 47    Recent Labs  Lab 10/16/22 0334 10/17/22 1117 10/18/22 0410  WBC 4.2 4.3 4.0  HGB 11.7* 12.0* 11.4*  HCT 34.4* 36.2* 33.9*  PLT 143* 144* 141*   No results for input(s): "APTT", "INR" in the last 168 hours.  Imaging Studies  No results found.  GI Procedures and Studies  No new relevant studies to review   ASSESSMENT  Mr. Linke is a 52 y.o. 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  Patient admitted with recurrent acute on chronic pancreatitis with persisting abdominal pain (GI consulted on day 5 to assist with his continued abdominal pain/discomfort and intolerance to oral intake.  Patient is hemodynamically stable from last night and states that today.  Clinically grossly unchanged.  Still having significant abdominal pain.  He however does want to have some adjustment in his diet so we will allow him in advancement and see if he can tolerate that.  Hopefully he can.  I think the celiac nerve block that we have discussed is reasonable  for [place] to continue to approach.  Pending anesthesia availability, I am planning this for Friday though it could get pushed to Saturday if something changes or develops with our anesthesia colleagues.  No plan for endoscopic reevaluation of the pancreas currently as there seem to be no pancreatic ductal abnormalities on most recent imaging.  He would best be served with a repeat MRI/MRCP, though that can be performed with his  primary gastroenterology team at [facility].  If the celiac block helps, hopefully he will be able to increase his oral intake and get out of the hospital.  We are successful with improvement of pain in 50 to 65% of individuals who have a celiac block performed, which can last days or weeks or months in some individuals.  If the patient is not able to increase his diet within the next 48 hours, he will need an NG tube to be placed or a core track to be placed or a Dobbhoff to be placed and he will need to initiate tube feeds or TPN.  The risks of an EUS including intestinal perforation, bleeding, infection, aspiration, and medication effects were discussed as was the possibility it may not give a definitive diagnosis if a biopsy is performed.  When a biopsy of the pancreas is done as part of the EUS, there is an additional risk of pancreatitis at the rate of about 1-2%.  It was explained that procedure related pancreatitis is typically mild, although it can be severe and even life threatening, which is why we do not perform random pancreatic biopsies and only biopsy a lesion/area we feel is concerning enough to warrant the risk.  The additional risk of paralysis and hypotension orthostasis that can occur when patients have celiac nerve blocks performed has been outlined to the patient as well (we see this is more of an issue in patients who undergo neurolysis rather than block but still something to be aware of).  All patient questions were answered to the best of my ability, and the patient agrees to the aforementioned plan of action with follow-up as indicated.   PLAN/RECOMMENDATIONS  Will allow patient to advance diet slowly and see if he can tolerate that today N.p.o. at midnight Plan for EGD/EUS with celiac block attempt on 7/5 (pending anesthesia availability versus 7/6 If patient cannot advance his diet in the next 48 hours, he will need an NG tube/core track/Dobbhoff to be placed or if this fails  TPN Appreciate medicine service with pain control ESR/CRP/INR added for tomorrow's labs   Please page/call with questions or concerns.   Corliss Parish, MD [facility] Office # [phone]    LOS: 6 days  [name]  10/18/2022, 10:25 AM

## 2022-10-18 NOTE — Plan of Care (Signed)
  Problem: Clinical Measurements: Goal: Respiratory complications will improve Outcome: Progressing Goal: Cardiovascular complication will be avoided Outcome: Progressing   Problem: Activity: Goal: Risk for activity intolerance will decrease Outcome: Progressing   Problem: Nutrition: Goal: Adequate nutrition will be maintained Outcome: Progressing   Problem: Coping: Goal: Level of anxiety will decrease Outcome: Progressing   Problem: Elimination: Goal: Will not experience complications related to urinary retention Outcome: Progressing   Problem: Pain Managment: Goal: General experience of comfort will improve Outcome: Progressing   

## 2022-10-18 NOTE — Anesthesia Preprocedure Evaluation (Deleted)
Anesthesia Evaluation    Airway       Comment: Previous grade I view with MAC 4, easy mask Dental   Pulmonary  Chronic bronchitis          Cardiovascular hypertension (telmisartan), Pt. on medications   HLD  Stress Test 02/28/2021: No reversible ischemia. LVEF 49% with normal wall motion (visually, LVEF appears higher). This is an intermediate risk study. Consider echo correlation. No prior for comparison.  TTE 11/18/2020: IMPRESSIONS     1. Left ventricular ejection fraction by 3D volume is 57 %. The left  ventricle has normal function. The left ventricle has no regional wall  motion abnormalities. There is mild left ventricular hypertrophy. Left  ventricular diastolic parameters were  normal.   2. Right ventricular systolic function is normal. The right ventricular  size is normal.   3. The mitral valve is grossly normal. Trivial mitral valve  regurgitation. No evidence of mitral stenosis.   4. The aortic valve is normal in structure. Aortic valve regurgitation is  not visualized. No aortic stenosis is present.   5. Aortic dilatation noted. There is borderline dilatation of the  ascending aorta, measuring 39 mm.     Neuro/Psych  Headaches  Neuromuscular disease (chronic low back pain)    GI/Hepatic ,GERD  Medicated,,Chronic pancreatitis, gastroparesis   Endo/Other    Renal/GU Renal disease (atrophy of right kidney)     Musculoskeletal   Abdominal   Peds  Hematology   Anesthesia Other Findings 52 y.o. male with medical history significant of gastroparesis, GERD, chronic pancreatitis on Creon who presented to the emergency department with recurrent abdominal pain.  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  He intermittently drinks alcohol however states it has been several weeks. Patient just had a CT scan 2 weeks ago which showed acute pancreatitis after which he was  discharged.   Reproductive/Obstetrics                             Anesthesia Physical Anesthesia Plan  ASA: 3  Anesthesia Plan: MAC   Post-op Pain Management:    Induction: Intravenous  PONV Risk Score and Plan: 1 and Propofol infusion and Treatment may vary due to age or medical condition  Airway Management Planned: Natural Airway and Nasal Cannula  Additional Equipment:   Intra-op Plan:   Post-operative Plan:   Informed Consent:      Dental advisory given  Plan Discussed with: CRNA and Anesthesiologist  Anesthesia Plan Comments: (Discussed with patient risks of MAC including, but not limited to, minor pain or discomfort, hearing people in the room, and possible need for backup general anesthesia. Risks for general anesthesia also discussed including, but not limited to, sore throat, hoarse voice, chipped/damaged teeth, injury to vocal cords, nausea and vomiting, allergic reactions, lung infection, heart attack, stroke, and death. All questions answered. )       Anesthesia Quick Evaluation

## 2022-10-18 NOTE — Progress Notes (Signed)
PROGRESS NOTE    Kyle Berry  ZOX:[id number] DOB: [birth year] DOA: 10/12/2022 PCP: [facility]    Chief Complaint  Patient presents with   Abdominal Pain    Brief Narrative:  53 y.o. male with medical history significant of gastroparesis, GERD, chronic pancreatitis on Creon who presented to the emergency department with recurrent abdominal pain.  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  He intermittently drinks alcohol however states it has been several weeks.  This afternoon he had intractable abdominal pain nausea and vomiting consistent with his prior pancreatitis flares.  He presented to the emergency department further assessment.  ER evaluation revealed elevated lipase.  Remaining labs showed bicarb 19, WBC 8.0, hemoglobin 13.7.  Patient just had a CT scan 2 weeks ago which showed acute pancreatitis after which she was discharged.  Patient was given IV fluids and admitted for further workup      Assessment & Plan:   Principal Problem:   Acute on chronic pancreatitis (HCC) Active Problems:   Essential hypertension   Esophageal reflux   Malnutrition of moderate degree   Chronic abdominal pain   Mild protein-calorie malnutrition (HCC)  #1 acute on chronic idiopathic pancreatitis, POA -Patient presented with worsening abdominal pain nausea and vomiting consistent with symptoms from prior pancreatitis flares. -Lipase levels noted on admission elevated at 317. -Patient of present patient had not drank alcohol in approximately 2 weeks. -Alcohol level on admission < 10. -Patient noted to be followed by [facility] and had prior biliary stenting placed with a history of pancreatic divisum. -Lipase levels trending down currently at 49 from 87 from 317. -CT abdomen and pelvis with findings of mild improvement of acute pancreatitis with no evidence of pancreatic necrosis, pseudocyst or other complication. -Patient  currently on clear liquids and when diet advanced to a full liquid diet unable to tolerate with worsening abdominal pain and nausea. -Continue IV fluids, continue Creon supplementation.  Continue current pain management. -Patient seen in consultation by GI diet advanced to full liquid diet today to see how patient tolerates it. -GI recommending possible celiac plexus block to see if this may decrease patient's pain threshold which will allow advancement of his diet.   -Per GI if patient does not have clinical improvement and unable to tolerate oral intake then may need core track or NG tube placement for nutrition versus TPN.   -GI following and appreciate their input and recommendations.  2.  Hypertension -ARB held on admission as patient had presented with concerns for acute pancreatitis.   -Patient started on Coreg 3.125 mg twice daily on 10/17/2022.  3.  GERD -Continue PPI.   4.  Moderate protein calorie malnutrition -Dietitian following.  5.  Hypomagnesemia -Repleted.   -Magnesium at 2.0 today.  -Repeat labs in the AM.   DVT prophylaxis: Lovenox Code Status: FullFull Family Communication: Updated patient.  No family at bedside. Disposition: Home when clinically improved, tolerating diet, and cleared by GI.  Status is: Inpatient Remains inpatient appropriate because: Severity of illness   Consultants:  Gastroenterology: Dr. Meridee Score 10/17/2022  Procedures:  CT abdomen and pelvis 10/14/2022   Antimicrobials:  Anti-infectives (From admission, onward)    None         Subjective: Patient laying in bed.  States he tolerated clear liquids however with oral intake has significant upper abdominal/epigastric pain.  States abdominal pain has been intermittent.  No chest pain.  No shortness of breath.  States  gastroenterologist is going to advance his diet to a full liquid diet today to see how he tolerates it.    Objective: Vitals:   10/17/22 1635 10/17/22 1937 10/18/22  0440 10/18/22 0817  BP: (!) 133/94 (!) 144/96 (!) 148/96 (!) 141/89  Pulse: (!) 59 64 (!) 59 61  Resp:  19 18 19   Temp: 97.6 F (36.4 C) 97.8 F (36.6 C) (!) 97.5 F (36.4 C) 98.2 F (36.8 C)  TempSrc: Oral Oral Oral   SpO2: 99% 100% 96% 100%  Weight:      Height:        Intake/Output Summary (Last 24 hours) at 10/18/2022 1214 Last data filed at 10/18/2022 1029 Gross per 24 hour  Intake 5814.21 ml  Output 2300 ml  Net 3514.21 ml    Filed Weights   10/12/22 1707 10/12/22 2348 10/16/22 0454  Weight: 72.6 kg 69.8 kg 70.2 kg    Examination:  General exam: NAD. Respiratory system: CTA, no wheezes, no crackles, no rhonchi.  Fair air movement.  Speaking in full sentences.  Cardiovascular system: Regular rate rhythm no murmurs rubs or gallops.  No JVD.  No lower extremity edema.  Gastrointestinal system: Abdomen is soft, nondistended, tender to palpation in the epigastrium and upper abdominal region.  Positive bowel sounds.  No rebound.  No guarding.  Central nervous system: Alert and oriented. No focal neurological deficits. Extremities: Symmetric 5 x 5 power. Skin: No rashes, lesions or ulcers Psychiatry: Judgement and insight appear normal. Mood & affect appropriate.     Data Reviewed: I have personally reviewed following labs and imaging studies  CBC: Recent Labs  Lab 10/12/22 1749 10/15/22 0200 10/16/22 0334 10/17/22 1117 10/18/22 0410  WBC 8.0 4.9 4.2 4.3 4.0  NEUTROABS 6.4  --   --  2.0  --   HGB 13.7 11.3* 11.7* 12.0* 11.4*  HCT 41.4 34.1* 34.4* 36.2* 33.9*  MCV 102.2* 104.0* 102.7* 102.3* 101.5*  PLT 194 133* 143* 144* 141*     Basic Metabolic Panel: Recent Labs  Lab 10/14/22 0134 10/15/22 0200 10/16/22 0334 10/17/22 1117 10/18/22 0410  NA 134* 136 138 133* 138  K 4.2 3.8 3.8 3.7 3.8  CL 100 99 97* 99 102  CO2 25 29 27 26 28   GLUCOSE 118* 109* 103* 138* 112*  BUN <5* <5* <5* <5* 5*  CREATININE 1.00 0.99 0.95 1.08 1.02  CALCIUM 8.9 8.9 9.3 8.9  8.7*  MG  --   --   --  1.5* 2.0     GFR: Estimated Creatinine Clearance: 83.2 mL/min (by C-G formula based on SCr of 1.02 mg/dL).  Liver Function Tests: Recent Labs  Lab 10/14/22 0134 10/15/22 0200 10/16/22 0334 10/17/22 1117 10/18/22 0410  AST 30 21 22 23 19   ALT 19 18 18 18 16   ALKPHOS 63 58 56 57 47  BILITOT 1.3* 0.8 0.3 0.7 0.7  PROT 6.2* 6.1* 6.1* 6.4* 6.1*  ALBUMIN 3.3* 3.1* 3.1* 3.2* 3.1*     CBG: No results for input(s): "GLUCAP" in the last 168 hours.   No results found for this or any previous visit (from the past 240 hour(s)).       Radiology Studies: No results found.      Scheduled Meds:  (feeding supplement) PROSource Plus  30 mL Oral TID BM   carvedilol  3.125 mg Oral BID WC   enoxaparin (LOVENOX) injection  40 mg Subcutaneous Q24H   feeding supplement  1 Container Oral TID BM  gabapentin  300 mg Oral TID   lipase/protease/amylase  24,000 Units Oral TID WC   pantoprazole  40 mg Oral Daily   Continuous Infusions:  lactated ringers 200 mL/hr at 10/18/22 1118   promethazine (PHENERGAN) injection (IM or IVPB) 12.5 mg (10/18/22 0137)     LOS: 6 days    Time spent: 35 minutes    Ramiro Harvest, MD [facility]   To contact the attending provider between 7A-7P or the covering provider during after hours 7P-7A, please log into the web site [email or web address] and access using universal [place] password for that web site. If you do not have the password, please call the hospital operator.  10/18/2022, 12:14 PM

## 2022-10-19 ENCOUNTER — Encounter (HOSPITAL_COMMUNITY)
Admission: EM | Disposition: A | Discharge status: Home / Self Care | Payer: Self-pay | Source: Home / Self Care | Attending: Internal Medicine

## 2022-10-19 DIAGNOSIS — K860 Alcohol-induced chronic pancreatitis: Secondary | ICD-10-CM | POA: Diagnosis not present

## 2022-10-19 DIAGNOSIS — G8929 Other chronic pain: Secondary | ICD-10-CM

## 2022-10-19 DIAGNOSIS — K219 Gastro-esophageal reflux disease without esophagitis: Secondary | ICD-10-CM | POA: Diagnosis not present

## 2022-10-19 DIAGNOSIS — I10 Essential (primary) hypertension: Secondary | ICD-10-CM | POA: Diagnosis not present

## 2022-10-19 DIAGNOSIS — K859 Acute pancreatitis without necrosis or infection, unspecified: Secondary | ICD-10-CM | POA: Diagnosis not present

## 2022-10-19 DIAGNOSIS — Z86010 Personal history of colonic polyps: Secondary | ICD-10-CM | POA: Diagnosis not present

## 2022-10-19 DIAGNOSIS — K861 Other chronic pancreatitis: Secondary | ICD-10-CM | POA: Diagnosis not present

## 2022-10-19 LAB — COMPREHENSIVE METABOLIC PANEL
ALT: 14 U/L (ref 0–44)
AST: 21 U/L (ref 15–41)
Albumin: 2.9 g/dL — ABNORMAL LOW (ref 3.5–5.0)
Alkaline Phosphatase: 61 U/L (ref 38–126)
Anion gap: 10 (ref 5–15)
BUN: 8 mg/dL (ref 6–20)
CO2: 22 mmol/L (ref 22–32)
Calcium: 8.7 mg/dL — ABNORMAL LOW (ref 8.9–10.3)
Chloride: 103 mmol/L (ref 98–111)
Creatinine, Ser: 1.25 mg/dL — ABNORMAL HIGH (ref 0.61–1.24)
GFR, Estimated: >60 mL/min (ref >60)
Glucose, Bld: 138 mg/dL — ABNORMAL HIGH (ref 70–99)
Potassium: 4 mmol/L (ref 3.5–5.1)
Sodium: 135 mmol/L (ref 135–145)
Total Bilirubin: 0.6 mg/dL (ref 0.3–1.2)
Total Protein: 5.8 g/dL — ABNORMAL LOW (ref 6.5–8.1)

## 2022-10-19 LAB — CBC
HCT: 32.1 % — ABNORMAL LOW (ref 39.0–52.0)
Hemoglobin: 10.6 g/dL — ABNORMAL LOW (ref 13.0–17.0)
MCH: 33.9 pg (ref 26.0–34.0)
MCHC: 33 g/dL (ref 30.0–36.0)
MCV: 102.6 fL — ABNORMAL HIGH (ref 80.0–100.0)
Platelets: 139 10*3/uL — ABNORMAL LOW (ref 150–400)
RBC: 3.13 MIL/uL — ABNORMAL LOW (ref 4.22–5.81)
RDW: 14.8 % (ref 11.5–15.5)
WBC: 3.7 10*3/uL — ABNORMAL LOW (ref 4.0–10.5)
nRBC: 0 % (ref 0.0–0.2)

## 2022-10-19 LAB — SEDIMENTATION RATE: Sed Rate: 32 mm/hr — ABNORMAL HIGH (ref 0–16)

## 2022-10-19 LAB — PROTIME-INR
INR: 1 (ref 0.8–1.2)
Prothrombin Time: 13.7 seconds (ref 11.4–15.2)

## 2022-10-19 LAB — C-REACTIVE PROTEIN: CRP: 0.8 mg/dL (ref <1.0)

## 2022-10-19 LAB — MAGNESIUM: Magnesium: 1.7 mg/dL (ref 1.7–2.4)

## 2022-10-19 SURGERY — CANCELLED PROCEDURE
Anesthesia: Monitor Anesthesia Care

## 2022-10-19 MED ORDER — MAGNESIUM SULFATE 2 GM/50ML IV SOLN
2.0000 g | Freq: Once | INTRAVENOUS | Status: AC
  Administered 2022-10-19: 2 g via INTRAVENOUS
  Filled 2022-10-19 (×2): qty 50

## 2022-10-19 MED ORDER — SODIUM CHLORIDE 0.9 % IV SOLN: INTRAVENOUS | Status: DC

## 2022-10-19 SURGICAL SUPPLY — 15 items

## 2022-10-19 NOTE — Progress Notes (Signed)
Gastroenterology Inpatient Follow-up Note   PATIENT IDENTIFICATION  Kyle Berry is a 52 y.o. male Hospital Day: 8  SUBJECTIVE  The patient's chart has been reviewed. The patient's labs have been reviewed. Patient was down in endoscopy unit to undergo his attempt at celiac axis nerve blockade, however we found out that he had his Lovenox shot earlier this morning; thus his procedure was canceled for bleeding risk. Patient still experiencing significant abdominal discomfort (rated 6-7 out of 10) in the mid epigastrium/upper abdomen. The patient denies fevers or chills. He was able to tolerate oral diet yesterday which is good news..   OBJECTIVE  Scheduled Inpatient Medications:   (feeding supplement) PROSource Plus  30 mL Oral TID BM   carvedilol  3.125 mg Oral BID WC   feeding supplement  1 Container Oral TID BM   gabapentin  300 mg Oral TID   lipase/protease/amylase  24,000 Units Oral TID WC   pantoprazole  40 mg Oral Daily   Continuous Inpatient Infusions:   lactated ringers 200 mL/hr at 10/19/22 2135   promethazine (PHENERGAN) injection (IM or IVPB) 12.5 mg (10/19/22 0214)   PRN Inpatient Medications: acetaminophen **OR** acetaminophen, hydrALAZINE, HYDROcodone-acetaminophen, HYDROmorphone (DILAUDID) injection, mouth rinse, promethazine (PHENERGAN) injection (IM or IVPB)   Physical Examination  Temp:  [97.5 F (36.4 C)-98.7 F (37.1 C)] 97.9 F (36.6 C) (07/05 2058) Pulse Rate:  [58-71] 67 (07/05 2058) Resp:  [17-18] 17 (07/05 2058) BP: (144-162)/(84-115) 162/97 (07/05 2058) SpO2:  [97 %-100 %] 100 % (07/05 2058) Weight:  [73 kg] 73 kg (07/05 0901) Temp (24hrs), Avg:98.1 F (36.7 C), Min:97.5 F (36.4 C), Max:98.7 F (37.1 C)  Weight: 73 kg GEN: NAD, appears stated age, doesn't appear chronically ill PSYCH: Cooperative, without pressured speech EYE: Conjunctivae pink, sclerae anicteric ENT: MMM CV: Nontachycardic RESP: No audible wheezing GI: NABS,  soft, rounded, TTP in MEG upon light and deep palpation, volitional guarding present, no rebound MSK/EXT: No significant lower extremity edema SKIN: No jaundice NEURO:  Alert & Oriented x 3, no focal deficits   Review of Data   Laboratory Studies   Recent Labs  Lab 10/19/22 0157  NA 135  K 4.0  CL 103  CO2 22  BUN 8  CREATININE 1.25*  GLUCOSE 138*  CALCIUM 8.7*  MG 1.7   Recent Labs  Lab 10/19/22 0157  AST 21  ALT 14  ALKPHOS 61    Recent Labs  Lab 10/17/22 1117 10/18/22 0410 10/19/22 0157  WBC 4.3 4.0 3.7*  HGB 12.0* 11.4* 10.6*  HCT 36.2* 33.9* 32.1*  PLT 144* 141* 139*   Recent Labs  Lab 10/19/22 0157  INR 1.0    Imaging Studies  No results found.  GI Procedures and Studies  No new relevant studies to review   ASSESSMENT  Kyle Berry is a 52 y.o. 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  Patient admitted with recurrent acute on chronic pancreatitis with persisting abdominal pain (GI consulted on day 5 to assist with his continued abdominal pain/discomfort and intolerance to oral intake.  Patient is hemodynamically and clinically stable at this time.  Still having significant pain control issues but was able to tolerate an oral diet.  Was planning for celiac blockade today but his Lovenox shot this morning will preclude [place] from safely performing procedure.  We will plan to reschedule him for tomorrow.  He may have  a normal diet the rest today and n.p.o. at midnight.  The risks of an EUS including intestinal perforation, bleeding, infection, aspiration, and medication effects were discussed as was the possibility it may not give a definitive diagnosis if a biopsy is performed.  When a biopsy of the pancreas is done as part of the EUS, there is an additional risk of pancreatitis at the rate of about 1-2%.  It was explained that  procedure related pancreatitis is typically mild, although it can be severe and even life threatening, which is why we do not perform random pancreatic biopsies and only biopsy a lesion/area we feel is concerning enough to warrant the risk.   PLAN/RECOMMENDATIONS  Heart healthy diet for now N.p.o. at midnight Holding all Lovenox (I have discontinued the order) Plan for EGD/EUS with celiac block attempt on 7/6 Appreciate medicine service with pain control Global follow-up will be back with his primary gastroenterologist, Dr. Normajean Glasgow at [facility] pancreatic clinic Complete alcohol abstinence necessary   Please page/call with questions or concerns.   Corliss Parish, MD [facility] Office # [phone]    LOS: 7 days  [name]  10/19/2022, 11:45 PM

## 2022-10-19 NOTE — H&P (View-Only) (Signed)
Gastroenterology Inpatient Follow-up Note   PATIENT IDENTIFICATION  Kyle Berry is a 52 y.o. male Hospital Day: 8  SUBJECTIVE  The patient's chart has been reviewed. The patient's labs have been reviewed. Patient was down in endoscopy unit to undergo his attempt at celiac axis nerve blockade, however we found out that he had his Lovenox shot earlier this morning; thus his procedure was canceled for bleeding risk. Patient still experiencing significant abdominal discomfort (rated 6-7 out of 10) in the mid epigastrium/upper abdomen. The patient denies fevers or chills. He was able to tolerate oral diet yesterday which is good news..   OBJECTIVE  Scheduled Inpatient Medications:   (feeding supplement) PROSource Plus  30 mL Oral TID BM   carvedilol  3.125 mg Oral BID WC   feeding supplement  1 Container Oral TID BM   gabapentin  300 mg Oral TID   lipase/protease/amylase  24,000 Units Oral TID WC   pantoprazole  40 mg Oral Daily   Continuous Inpatient Infusions:   lactated ringers 200 mL/hr at 10/19/22 2135   promethazine (PHENERGAN) injection (IM or IVPB) 12.5 mg (10/19/22 0214)   PRN Inpatient Medications: acetaminophen **OR** acetaminophen, hydrALAZINE, HYDROcodone-acetaminophen, HYDROmorphone (DILAUDID) injection, mouth rinse, promethazine (PHENERGAN) injection (IM or IVPB)   Physical Examination  Temp:  [97.5 F (36.4 C)-98.7 F (37.1 C)] 97.9 F (36.6 C) (07/05 2058) Pulse Rate:  [58-71] 67 (07/05 2058) Resp:  [17-18] 17 (07/05 2058) BP: (144-162)/(84-115) 162/97 (07/05 2058) SpO2:  [97 %-100 %] 100 % (07/05 2058) Weight:  [73 kg] 73 kg (07/05 0901) Temp (24hrs), Avg:98.1 F (36.7 C), Min:97.5 F (36.4 C), Max:98.7 F (37.1 C)  Weight: 73 kg GEN: NAD, appears stated age, doesn't appear chronically ill PSYCH: Cooperative, without pressured speech EYE: Conjunctivae pink, sclerae anicteric ENT: MMM CV: Nontachycardic RESP: No audible wheezing GI: NABS,  soft, rounded, TTP in MEG upon light and deep palpation, volitional guarding present, no rebound MSK/EXT: No significant lower extremity edema SKIN: No jaundice NEURO:  Alert & Oriented x 3, no focal deficits   Review of Data   Laboratory Studies   Recent Labs  Lab 10/19/22 0157  NA 135  K 4.0  CL 103  CO2 22  BUN 8  CREATININE 1.25*  GLUCOSE 138*  CALCIUM 8.7*  MG 1.7   Recent Labs  Lab 10/19/22 0157  AST 21  ALT 14  ALKPHOS 61    Recent Labs  Lab 10/17/22 1117 10/18/22 0410 10/19/22 0157  WBC 4.3 4.0 3.7*  HGB 12.0* 11.4* 10.6*  HCT 36.2* 33.9* 32.1*  PLT 144* 141* 139*   Recent Labs  Lab 10/19/22 0157  INR 1.0    Imaging Studies  No results found.  GI Procedures and Studies  No new relevant studies to review   ASSESSMENT  Kyle Berry is a 52 y.o. 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  Patient admitted with recurrent acute on chronic pancreatitis with persisting abdominal pain (GI consulted on day 5 to assist with his continued abdominal pain/discomfort and intolerance to oral intake.  Patient is hemodynamically and clinically stable at this time.  Still having significant pain control issues but was able to tolerate an oral diet.  Was planning for celiac blockade today but his Lovenox shot this morning will preclude [place] from safely performing procedure.  We will plan to reschedule him for tomorrow.  He may have  a normal diet the rest today and n.p.o. at midnight.  The risks of an EUS including intestinal perforation, bleeding, infection, aspiration, and medication effects were discussed as was the possibility it may not give a definitive diagnosis if a biopsy is performed.  When a biopsy of the pancreas is done as part of the EUS, there is an additional risk of pancreatitis at the rate of about 1-2%.  It was explained that  procedure related pancreatitis is typically mild, although it can be severe and even life threatening, which is why we do not perform random pancreatic biopsies and only biopsy a lesion/area we feel is concerning enough to warrant the risk.   PLAN/RECOMMENDATIONS  Heart healthy diet for now N.p.o. at midnight Holding all Lovenox (I have discontinued the order) Plan for EGD/EUS with celiac block attempt on 7/6 Appreciate medicine service with pain control Global follow-up will be back with his primary gastroenterologist, Dr. Normajean Glasgow at [facility] pancreatic clinic Complete alcohol abstinence necessary   Please page/call with questions or concerns.   Corliss Parish, MD [place] Office # [phone]    LOS: 7 days  Kyle Berry  10/19/2022, 11:45 PM

## 2022-10-19 NOTE — Progress Notes (Signed)
PROGRESS NOTE    Kyle Berry  WUJ:[id number] DOB: May 07, 1970 DOA: 10/12/2022 PCP: [facility]    Chief Complaint  Patient presents with   Abdominal Pain    Brief Narrative:  52 y.o. male with medical history significant of gastroparesis, GERD, chronic pancreatitis on Creon who presented to the emergency department with recurrent abdominal pain.  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  He intermittently drinks alcohol however states it has been several weeks.  This afternoon he had intractable abdominal pain nausea and vomiting consistent with his prior pancreatitis flares.  He presented to the emergency department further assessment.  ER evaluation revealed elevated lipase.  Remaining labs showed bicarb 19, WBC 8.0, hemoglobin 13.7.  Patient just had a CT scan 2 weeks ago which showed acute pancreatitis after which she was discharged.  Patient was given IV fluids and admitted for further workup      Assessment & Plan:   Principal Problem:   Acute on chronic pancreatitis (HCC) Active Problems:   Essential hypertension   Esophageal reflux   Malnutrition of moderate degree   Chronic abdominal pain   Mild protein-calorie malnutrition (HCC)  #1 acute on chronic idiopathic pancreatitis, POA -Patient presented with worsening abdominal pain nausea and vomiting consistent with symptoms from prior pancreatitis flares. -Lipase levels noted on admission elevated at 317. -Patient of present patient had not drank alcohol in approximately 2 weeks. -Alcohol level on admission < 10. -Patient noted to be followed by [facility] and had prior biliary stenting placed with a history of pancreatic divisum. -Lipase levels trending down currently at 49 from 87 from 317. -CT abdomen and pelvis with findings of mild improvement of acute pancreatitis with no evidence of pancreatic necrosis, pseudocyst or other complication. -Patient  currently on clear liquids and when diet was advanced to a full liquid diet unable to tolerate with worsening abdominal pain and nausea. -Continue IV fluids, continue Creon supplementation.  Continue current pain management. -Patient seen in consultation by GI diet advanced to full liquid diet on 10/18/2022 to see how patient tolerated it.  -GI recommending possible celiac plexus block hopefully to be done today 10/19/2022, to see if this may decrease patient's pain threshold which will allow advancement of his diet.   -Per GI if patient does not have clinical improvement despite celiac block, and unable to tolerate oral intake then may need cortrack or NG tube placement for nutrition versus TPN.   -GI following and appreciate their input and recommendations.  2.  Hypertension -ARB held on admission as patient had presented with concerns for acute pancreatitis.   -Continue Coreg 3.125 mg twice daily.    3.  GERD -PPI.    4.  Moderate protein calorie malnutrition -Dietitian following.  5.  Hypomagnesemia -Repleted.   -Magnesium at 1. 7 today. -Magnesium sulfate 2 g IV x 1. -Repeat labs in the AM.   DVT prophylaxis: Lovenox Code Status: FullFull Family Communication: Updated patient.  No family at bedside. Disposition: Home when clinically improved, tolerating diet, and cleared by GI.  Status is: Inpatient Remains inpatient appropriate because: Severity of illness   Consultants:  Gastroenterology: Dr. Meridee Score 10/17/2022  Procedures:  CT abdomen and pelvis 10/14/2022   Antimicrobials:  Anti-infectives (From admission, onward)    None         Subjective: Patient walking around the room.  No chest pain, no shortness of breath.  Still with epigastric upper abdominal pain that  he states is intermittent.  Currently n.p.o. awaiting GI procedure to be done today.   Objective: Vitals:   10/18/22 1648 10/18/22 2032 10/19/22 0510 10/19/22 0523  BP: 136/84 (!) 147/87 (!) 144/86  (!) 147/84  Pulse: 65 64 60 (!) 58  Resp: 18 17 18 17   Temp: 98.5 F (36.9 C) 98.5 F (36.9 C) 98.3 F (36.8 C) 98 F (36.7 C)  TempSrc:   Oral Oral  SpO2: 97% 98% 99% 97%  Weight:      Height:        Intake/Output Summary (Last 24 hours) at 10/19/2022 0853 Last data filed at 10/19/2022 1610 Gross per 24 hour  Intake 5385.65 ml  Output 4725 ml  Net 660.65 ml    Filed Weights   10/12/22 1707 10/12/22 2348 10/16/22 0454  Weight: 72.6 kg 69.8 kg 70.2 kg    Examination:  General exam: NAD. Respiratory system: Lungs clear to auscultation bilaterally.  No wheezes, no crackles, no rhonchi.  Fair air movement.  Speaking in full sentences.   Cardiovascular system: RRR no murmurs rubs or gallops.  No JVD.  No lower extremity edema.  Gastrointestinal system: Abdomen is soft, nondistended, tender to palpation in the epigastrium and upper abdominal region.  Positive bowel sounds.  No rebound.  No guarding.  Central nervous system: Alert and oriented. No focal neurological deficits. Extremities: Symmetric 5 x 5 power. Skin: No rashes, lesions or ulcers Psychiatry: Judgement and insight appear normal. Mood & affect appropriate.     Data Reviewed: I have personally reviewed following labs and imaging studies  CBC: Recent Labs  Lab 10/12/22 1749 10/15/22 0200 10/16/22 0334 10/17/22 1117 10/18/22 0410 10/19/22 0157  WBC 8.0 4.9 4.2 4.3 4.0 3.7*  NEUTROABS 6.4  --   --  2.0  --   --   HGB 13.7 11.3* 11.7* 12.0* 11.4* 10.6*  HCT 41.4 34.1* 34.4* 36.2* 33.9* 32.1*  MCV 102.2* 104.0* 102.7* 102.3* 101.5* 102.6*  PLT 194 133* 143* 144* 141* 139*     Basic Metabolic Panel: Recent Labs  Lab 10/15/22 0200 10/16/22 0334 10/17/22 1117 10/18/22 0410 10/19/22 0157  NA 136 138 133* 138 135  K 3.8 3.8 3.7 3.8 4.0  CL 99 97* 99 102 103  CO2 29 27 26 28 22   GLUCOSE 109* 103* 138* 112* 138*  BUN <5* <5* <5* 5* 8  CREATININE 0.99 0.95 1.08 1.02 1.25*  CALCIUM 8.9 9.3 8.9 8.7* 8.7*   MG  --   --  1.5* 2.0 1.7     GFR: Estimated Creatinine Clearance: 67.9 mL/min (A) (by C-G formula based on SCr of 1.25 mg/dL (H)).  Liver Function Tests: Recent Labs  Lab 10/15/22 0200 10/16/22 0334 10/17/22 1117 10/18/22 0410 10/19/22 0157  AST 21 22 23 19 21   ALT 18 18 18 16 14   ALKPHOS 58 56 57 47 61  BILITOT 0.8 0.3 0.7 0.7 0.6  PROT 6.1* 6.1* 6.4* 6.1* 5.8*  ALBUMIN 3.1* 3.1* 3.2* 3.1* 2.9*     CBG: No results for input(s): "GLUCAP" in the last 168 hours.   No results found for this or any previous visit (from the past 240 hour(s)).       Radiology Studies: No results found.      Scheduled Meds:  (feeding supplement) PROSource Plus  30 mL Oral TID BM   carvedilol  3.125 mg Oral BID WC   enoxaparin (LOVENOX) injection  40 mg Subcutaneous Q24H   feeding supplement  1  Container Oral TID BM   gabapentin  300 mg Oral TID   lipase/protease/amylase  24,000 Units Oral TID WC   pantoprazole  40 mg Oral Daily   Continuous Infusions:  sodium chloride     lactated ringers 200 mL/hr at 10/19/22 1610   magnesium sulfate bolus IVPB     promethazine (PHENERGAN) injection (IM or IVPB) 12.5 mg (10/19/22 0214)     LOS: 7 days    Time spent: 35 minutes    Ramiro Harvest, MD [facility]   To contact the attending provider between 7A-7P or the covering provider during after hours 7P-7A, please log into the web site [email or web address] and access using universal [place] password for that web site. If you do not have the password, please call the hospital operator.  10/19/2022, 8:53 AM

## 2022-10-19 NOTE — Progress Notes (Signed)
Pt arrived in ENDO, Lovenox was not held by floor staff. Per Dr. Meridee Score case cancelled today for EGD/EUS, pt to be done tomorrow 10/20/2022. KMB

## 2022-10-20 ENCOUNTER — Inpatient Hospital Stay (HOSPITAL_COMMUNITY): Payer: Managed Care, Other (non HMO) | Admitting: Certified Registered"

## 2022-10-20 ENCOUNTER — Encounter (HOSPITAL_COMMUNITY): Payer: Self-pay | Admitting: Internal Medicine

## 2022-10-20 ENCOUNTER — Encounter (HOSPITAL_COMMUNITY)
Admission: EM | Disposition: A | Discharge status: Home / Self Care | Payer: Self-pay | Source: Home / Self Care | Attending: Internal Medicine

## 2022-10-20 DIAGNOSIS — I10 Essential (primary) hypertension: Secondary | ICD-10-CM

## 2022-10-20 DIAGNOSIS — K859 Acute pancreatitis without necrosis or infection, unspecified: Secondary | ICD-10-CM | POA: Diagnosis not present

## 2022-10-20 DIAGNOSIS — I899 Noninfective disorder of lymphatic vessels and lymph nodes, unspecified: Secondary | ICD-10-CM | POA: Diagnosis not present

## 2022-10-20 DIAGNOSIS — K838 Other specified diseases of biliary tract: Secondary | ICD-10-CM | POA: Diagnosis not present

## 2022-10-20 DIAGNOSIS — K861 Other chronic pancreatitis: Secondary | ICD-10-CM | POA: Diagnosis not present

## 2022-10-20 DIAGNOSIS — K3189 Other diseases of stomach and duodenum: Secondary | ICD-10-CM

## 2022-10-20 DIAGNOSIS — K869 Disease of pancreas, unspecified: Secondary | ICD-10-CM | POA: Diagnosis not present

## 2022-10-20 DIAGNOSIS — K449 Diaphragmatic hernia without obstruction or gangrene: Secondary | ICD-10-CM

## 2022-10-20 DIAGNOSIS — K219 Gastro-esophageal reflux disease without esophagitis: Secondary | ICD-10-CM | POA: Diagnosis not present

## 2022-10-20 DIAGNOSIS — K2289 Other specified disease of esophagus: Secondary | ICD-10-CM | POA: Diagnosis not present

## 2022-10-20 HISTORY — PX: NEUROLYTIC CELIAC PLEXUS: SHX5435

## 2022-10-20 HISTORY — PX: UPPER ESOPHAGEAL ENDOSCOPIC ULTRASOUND (EUS): SHX6562

## 2022-10-20 HISTORY — PX: BIOPSY: SHX5522

## 2022-10-20 HISTORY — PX: ESOPHAGOGASTRODUODENOSCOPY (EGD) WITH PROPOFOL: SHX5813

## 2022-10-20 LAB — CBC
HCT: 35.4 % — ABNORMAL LOW (ref 39.0–52.0)
Hemoglobin: 11.7 g/dL — ABNORMAL LOW (ref 13.0–17.0)
MCH: 34.5 pg — ABNORMAL HIGH (ref 26.0–34.0)
MCHC: 33.1 g/dL (ref 30.0–36.0)
MCV: 104.4 fL — ABNORMAL HIGH (ref 80.0–100.0)
Platelets: 148 10*3/uL — ABNORMAL LOW (ref 150–400)
RBC: 3.39 MIL/uL — ABNORMAL LOW (ref 4.22–5.81)
RDW: 14.9 % (ref 11.5–15.5)
WBC: 4.5 10*3/uL (ref 4.0–10.5)
nRBC: 0 % (ref 0.0–0.2)

## 2022-10-20 LAB — COMPREHENSIVE METABOLIC PANEL
ALT: 16 U/L (ref 0–44)
AST: 20 U/L (ref 15–41)
Albumin: 3.2 g/dL — ABNORMAL LOW (ref 3.5–5.0)
Alkaline Phosphatase: 59 U/L (ref 38–126)
Anion gap: 7 (ref 5–15)
BUN: 8 mg/dL (ref 6–20)
CO2: 27 mmol/L (ref 22–32)
Calcium: 8.9 mg/dL (ref 8.9–10.3)
Chloride: 101 mmol/L (ref 98–111)
Creatinine, Ser: 1.09 mg/dL (ref 0.61–1.24)
GFR, Estimated: >60 mL/min (ref >60)
Glucose, Bld: 136 mg/dL — ABNORMAL HIGH (ref 70–99)
Potassium: 3.7 mmol/L (ref 3.5–5.1)
Sodium: 135 mmol/L (ref 135–145)
Total Bilirubin: 0.5 mg/dL (ref 0.3–1.2)
Total Protein: 6.4 g/dL — ABNORMAL LOW (ref 6.5–8.1)

## 2022-10-20 LAB — MAGNESIUM: Magnesium: 1.8 mg/dL (ref 1.7–2.4)

## 2022-10-20 SURGERY — UPPER ESOPHAGEAL ENDOSCOPIC ULTRASOUND (EUS)
Anesthesia: Monitor Anesthesia Care

## 2022-10-20 MED ORDER — HYDRALAZINE HCL 20 MG/ML IJ SOLN
5.0000 mg | INTRAMUSCULAR | Status: DC | PRN
  Administered 2022-10-20 (×2): 5 mg via INTRAVENOUS

## 2022-10-20 MED ORDER — BUPIVACAINE HCL (PF) 0.25 % IJ SOLN
20.0000 mL | Freq: Once | INTRAMUSCULAR | Status: AC
  Administered 2022-10-20: 20 mL
  Filled 2022-10-20 (×2): qty 20

## 2022-10-20 MED ORDER — HYDRALAZINE HCL 20 MG/ML IJ SOLN
INTRAMUSCULAR | Status: AC
  Filled 2022-10-20: qty 1

## 2022-10-20 MED ORDER — FENTANYL CITRATE (PF) 250 MCG/5ML IJ SOLN
INTRAMUSCULAR | Status: AC
  Filled 2022-10-20: qty 5

## 2022-10-20 MED ORDER — MIDAZOLAM HCL 2 MG/2ML IJ SOLN
INTRAMUSCULAR | Status: AC
  Filled 2022-10-20: qty 2

## 2022-10-20 MED ORDER — TRIAMCINOLONE ACETONIDE 40 MG/ML IJ SUSP
INTRAMUSCULAR | Status: DC | PRN
  Administered 2022-10-20: 80 mg

## 2022-10-20 MED ORDER — MAGNESIUM SULFATE 2 GM/50ML IV SOLN
2.0000 g | Freq: Once | INTRAVENOUS | Status: AC
  Administered 2022-10-20: 2 g via INTRAVENOUS
  Filled 2022-10-20: qty 50

## 2022-10-20 MED ORDER — PROPOFOL 10 MG/ML IV BOLUS
INTRAVENOUS | Status: DC | PRN
  Administered 2022-10-20: 200 ug/kg/min via INTRAVENOUS

## 2022-10-20 MED ORDER — SUCRALFATE 1 G PO TABS
1.0000 g | ORAL_TABLET | Freq: Two times a day (BID) | ORAL | Status: DC
  Administered 2022-10-20 – 2022-10-23 (×7): 1 g via ORAL
  Filled 2022-10-20 (×7): qty 1

## 2022-10-20 MED ORDER — LIDOCAINE 2% (20 MG/ML) 5 ML SYRINGE
INTRAMUSCULAR | Status: DC | PRN
  Administered 2022-10-20: 60 mg via INTRAVENOUS

## 2022-10-20 MED ORDER — TRIAMCINOLONE ACETONIDE 40 MG/ML IJ SUSP
INTRAMUSCULAR | Status: AC
  Filled 2022-10-20: qty 2

## 2022-10-20 NOTE — Anesthesia Postprocedure Evaluation (Signed)
Anesthesia Post Note  Patient: Kyle Berry  Procedure(s) Performed: UPPER ESOPHAGEAL ENDOSCOPIC ULTRASOUND (EUS) BIOPSY NEUROLYTIC CELIAC PLEXUS     Patient location during evaluation: PACU Anesthesia Type: MAC Level of consciousness: awake Pain management: pain level controlled Vital Signs Assessment: post-procedure vital signs reviewed and stable Respiratory status: spontaneous breathing, nonlabored ventilation and respiratory function stable Cardiovascular status: blood pressure returned to baseline and stable Postop Assessment: no apparent nausea or vomiting Anesthetic complications: no   No notable events documented.  Last Vitals:  Vitals:   10/20/22 1330 10/20/22 1340  BP: (!) 163/95 (!) 168/94  Pulse: 61 62  Resp: 15 16  Temp:    SpO2: 100% 100%    Last Pain:  Vitals:   10/20/22 1330  TempSrc:   PainSc: 0-No pain                 Ruthy Forry P Shalona Harbour

## 2022-10-20 NOTE — Progress Notes (Signed)
PROGRESS NOTE    Kyle Berry  ZOX:[id number] DOB: 1970-08-28 DOA: 10/12/2022 PCP: [facility]    Chief Complaint  Patient presents with   Abdominal Pain    Brief Narrative:  52 y.o. male with medical history significant of gastroparesis, GERD, chronic pancreatitis on Creon who presented to the emergency department with recurrent abdominal pain.  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  He intermittently drinks alcohol however states it has been several weeks.  This afternoon he had intractable abdominal pain nausea and vomiting consistent with his prior pancreatitis flares.  He presented to the emergency department further assessment.  ER evaluation revealed elevated lipase.  Remaining labs showed bicarb 19, WBC 8.0, hemoglobin 13.7.  Patient just had a CT scan 2 weeks ago which showed acute pancreatitis after which she was discharged.  Patient was given IV fluids and admitted for further workup      Assessment & Plan:   Principal Problem:   Acute on chronic pancreatitis [facility]) Active Problems:   Essential hypertension   Esophageal reflux   Malnutrition of moderate degree   Chronic abdominal pain   Mild protein-calorie malnutrition (HCC)   Other chronic pain   Chronic alcoholic pancreatitis (HCC)  #1 acute on chronic idiopathic pancreatitis, POA -Patient presented with worsening abdominal pain nausea and vomiting consistent with symptoms from prior pancreatitis flares. -Lipase levels noted on admission elevated at 317. -Patient of present patient had not drank alcohol in approximately 2 weeks. -Alcohol level on admission < 10. -Patient noted to be followed by [facility] and had prior biliary stenting placed with a history of pancreatic divisum. -Lipase levels trending down currently at 49 from 87 from 317. -CT abdomen and pelvis with findings of mild improvement of acute pancreatitis with no evidence of  pancreatic necrosis, pseudocyst or other complication. -Patient was on clear liquids and when diet was advanced to a full liquid diet unable to tolerate with worsening abdominal pain and nausea and as such diet downgraded back to clear liquids.. -Continue IV fluids, continue Creon supplementation.  Continue current pain management. -Patient seen in consultation by GI diet advanced to full liquid diet on 10/18/2022 to see how patient tolerated it.  -GI recommending possible celiac plexus block hopefully to be done 10/19/2022, to see if this may decrease patient's pain threshold which will allow advancement of his diet.   -Patient was taken down for procedure 10/19/2022 however had received Lovenox that morning and as such procedure canceled and rescheduled for today 10/20/2022. -Per GI if patient does not have clinical improvement despite celiac block, and unable to tolerate oral intake then may need cortrack or NG tube placement for nutrition versus TPN.   -GI following and appreciate their input and recommendations.  2.  Hypertension -ARB held on admission as patient had presented with concerns for acute pancreatitis.   -Continue Coreg 3.125 mg twice daily.    3.  GERD -Continue PPI.   4.  Moderate protein calorie malnutrition -Dietitian following.  5.  Hypomagnesemia -Repleted.   -Magnesium at 1.8 today. -Magnesium sulfate 2 g IV x 1. -Repeat labs in the AM.   DVT prophylaxis: Lovenox Code Status: FullFull Family Communication: Updated patient.  No family at bedside. Disposition: Home when clinically improved, tolerating diet, and cleared by GI.  Status is: Inpatient Remains inpatient appropriate because: Severity of illness   Consultants:  Gastroenterology: Dr. Meridee Score 10/17/2022  Procedures:  CT abdomen and pelvis 10/14/2022  Antimicrobials:  Anti-infectives (From admission, onward)    None         Subjective: Up by the sink brushing his teeth.  Denies any chest pain  or shortness of breath.  Still with epigastric abdominal pain with oral intake.  Tried a regular diet yesterday ate some spaghetti initially he stated he felt okay and then subsequently had epigastric pain requiring pain medication.  Denies any significant nausea or emesis.  Awaiting to have procedure done today.    Objective: Vitals:   10/19/22 1558 10/19/22 2058 10/20/22 0438 10/20/22 0813  BP: (!) 145/89 (!) 162/97 (!) 157/91 (!) 150/91  Pulse: 64 67 60 64  Resp: 18 17 15 18   Temp: (!) 97.5 F (36.4 C) 97.9 F (36.6 C) 98.1 F (36.7 C) 98.2 F (36.8 C)  TempSrc: Oral Oral Oral Oral  SpO2: 100% 100% 98% 98%  Weight:      Height:        Intake/Output Summary (Last 24 hours) at 10/20/2022 1047 Last data filed at 10/20/2022 0900 Gross per 24 hour  Intake 5137.98 ml  Output 3585 ml  Net 1552.98 ml    Filed Weights   10/12/22 2348 10/16/22 0454 10/19/22 0901  Weight: 69.8 kg 70.2 kg 73 kg    Examination:  General exam: NAD. Respiratory system: CTAB.  No wheezes, no crackles, no rhonchi.  Fair air movement.  Speaking in full sentences.  Cardiovascular system: Regular rate rhythm no murmurs rubs or gallops.  No JVD.  No lower extremity edema.  Gastrointestinal system: Abdomen is soft, nondistended, TTP in the epigastric region.  Positive bowel sounds.  No rebound.  No guarding.   Central nervous system: Alert and oriented. No focal neurological deficits. Extremities: Symmetric 5 x 5 power. Skin: No rashes, lesions or ulcers Psychiatry: Judgement and insight appear normal. Mood & affect appropriate.     Data Reviewed: I have personally reviewed following labs and imaging studies  CBC: Recent Labs  Lab 10/16/22 0334 10/17/22 1117 10/18/22 0410 10/19/22 0157 10/20/22 0059  WBC 4.2 4.3 4.0 3.7* 4.5  NEUTROABS  --  2.0  --   --   --   HGB 11.7* 12.0* 11.4* 10.6* 11.7*  HCT 34.4* 36.2* 33.9* 32.1* 35.4*  MCV 102.7* 102.3* 101.5* 102.6* 104.4*  PLT 143* 144* 141* 139* 148*      Basic Metabolic Panel: Recent Labs  Lab 10/16/22 0334 10/17/22 1117 10/18/22 0410 10/19/22 0157 10/20/22 0059  NA 138 133* 138 135 135  K 3.8 3.7 3.8 4.0 3.7  CL 97* 99 102 103 101  CO2 27 26 28 22 27   GLUCOSE 103* 138* 112* 138* 136*  BUN <5* <5* 5* 8 8  CREATININE 0.95 1.08 1.02 1.25* 1.09  CALCIUM 9.3 8.9 8.7* 8.7* 8.9  MG  --  1.5* 2.0 1.7 1.8     GFR: Estimated Creatinine Clearance: 80.9 mL/min (by C-G formula based on SCr of 1.09 mg/dL).  Liver Function Tests: Recent Labs  Lab 10/16/22 0334 10/17/22 1117 10/18/22 0410 10/19/22 0157 10/20/22 0059  AST 22 23 19 21 20   ALT 18 18 16 14 16   ALKPHOS 56 57 47 61 59  BILITOT 0.3 0.7 0.7 0.6 0.5  PROT 6.1* 6.4* 6.1* 5.8* 6.4*  ALBUMIN 3.1* 3.2* 3.1* 2.9* 3.2*     CBG: No results for input(s): "GLUCAP" in the last 168 hours.   No results found for this or any previous visit (from the past 240 hour(s)).  Radiology Studies: No results found.      Scheduled Meds:  (feeding supplement) PROSource Plus  30 mL Oral TID BM   bupivacaine (PF)  20 mL Other Once   carvedilol  3.125 mg Oral BID WC   feeding supplement  1 Container Oral TID BM   gabapentin  300 mg Oral TID   lipase/protease/amylase  24,000 Units Oral TID WC   pantoprazole  40 mg Oral Daily   Continuous Infusions:  lactated ringers 200 mL/hr at 10/20/22 0741   promethazine (PHENERGAN) injection (IM or IVPB) 12.5 mg (10/19/22 0214)     LOS: 8 days    Time spent: 35 minutes    Ramiro Harvest, MD [facility]   To contact the attending provider between 7A-7P or the covering provider during after hours 7P-7A, please log into the web site [email or web address] and access using universal [place] password for that web site. If you do not have the password, please call the hospital operator.  10/20/2022, 10:47 AM

## 2022-10-20 NOTE — Anesthesia Preprocedure Evaluation (Addendum)
Anesthesia Evaluation  Patient identified by MRN, date of birth, ID band Patient awake    Reviewed: Allergy & Precautions, NPO status , Patient's Chart, lab work & pertinent test results  Airway Mallampati: II  TM Distance: >3 FB Neck ROM: Full    Dental  (+)    Pulmonary neg pulmonary ROS   Pulmonary exam normal        Cardiovascular hypertension, Pt. on medications Normal cardiovascular exam     Neuro/Psych  Headaches  negative psych ROS   GI/Hepatic PUD,GERD  Medicated and Controlled,,(+)     substance abuse    Endo/Other  Recurrent acute pancreatitis  Renal/GU Renal disease     Musculoskeletal negative musculoskeletal ROS (+)    Abdominal   Peds  Hematology  (+) Blood dyscrasia, anemia   Anesthesia Other Findings Chronic Pain from Chronic Pancreatitis  Reproductive/Obstetrics                             Anesthesia Physical Anesthesia Plan  ASA: 3  Anesthesia Plan: MAC   Post-op Pain Management:    Induction: Intravenous  PONV Risk Score and Plan: 1 and Propofol infusion and Treatment may vary due to age or medical condition  Airway Management Planned: Nasal Cannula  Additional Equipment:   Intra-op Plan:   Post-operative Plan:   Informed Consent: I have reviewed the patients History and Physical, chart, labs and discussed the procedure including the risks, benefits and alternatives for the proposed anesthesia with the patient or authorized representative who has indicated his/her understanding and acceptance.     Dental advisory given  Plan Discussed with: CRNA  Anesthesia Plan Comments:        Anesthesia Quick Evaluation

## 2022-10-20 NOTE — Op Note (Signed)
[facility] Patient Name: Kyle Berry Procedure Date : 10/20/2022 MRN: [id number] Attending MD: Corliss Parish , MD, [id number] Date of Birth: 1970-10-18 CSN: [id number] Age: 52 Admit Type: Inpatient Procedure:                Upper EUS Indications:              Chronic pancreatitis, Generalized abdominal pain,                            Celiac plexus block for pain secondary to chronic                            pancreatitis Providers:                Corliss Parish, MD, Geralyn Corwin, RN,                            Kandice Robinsons, Technician Referring MD:             Inpatient medical service Medicines:                Monitored Anesthesia Care Complications:            No immediate complications. Estimated Blood Loss:     Estimated blood loss was minimal. Procedure:                Pre-Anesthesia Assessment:                           - Prior to the procedure, a History and Physical                            was performed, and patient medications and                            allergies were reviewed. The patient's tolerance of                            previous anesthesia was also reviewed. The risks                            and benefits of the procedure and the sedation                            options and risks were discussed with the patient.                            All questions were answered, and informed consent                            was obtained. Prior Anticoagulants: The patient has                            taken Lovenox (enoxaparin), last dose was 1 day  prior to procedure. ASA Grade Assessment: III - A                            patient with severe systemic disease. After                            reviewing the risks and benefits, the patient was                            deemed in satisfactory condition to undergo the                            procedure.                           After obtaining  informed consent, the endoscope was                            passed under direct vision. Throughout the                            procedure, the patient's blood pressure, pulse, and                            oxygen saturations were monitored continuously. The                            [id number]-[id number] ([id number]) Olympus endoscope was introduced                            through the mouth, and advanced to the second part                            of duodenum. The [id number]-[id number] ([id number]) Olympus                            duodenoscope was introduced through the mouth, and                            advanced to the area of papilla. The [id number]                            ([id number]) Olympus linear ultrasound scope was                            introduced through the mouth, and advanced to the                            duodenum for ultrasound examination from the                            stomach and duodenum. The upper EUS was  accomplished without difficulty. The patient                            tolerated the procedure. Scope In: Scope Out: Findings:      ENDOSCOPIC FINDING: :      No gross lesions were noted in the entire esophagus.      The Z-line was irregular and was found 43 cm from the incisors.      A 1 cm hiatal hernia was present.      Striped moderately erythematous mucosa was found in the entire examined       stomach. Biopsies were taken with a cold forceps for histology and       Helicobacter pylori testing.      No gross lesions were noted in the duodenal bulb, in the first portion       of the duodenum and in the second portion of the duodenum.      The major papilla was normal (could not truly visualize previous       sphincterotomies.      ENDOSONOGRAPHIC FINDING: :      Pancreatic parenchymal abnormalities were noted in the pancreatic head,       genu of the pancreas, pancreatic body and pancreatic tail. These       consisted of hyperechoic foci  with shadowing, lobularity without       honeycombing and hyperechoic strands. The parenchyma itself was much       more on the hypoechoic scale but this was noted throughout the tissue.      The pancreatic duct had hyperechoic walls throughout. Measurements       however did not suggest significant stricturing and I did not visualize       any pancreatic ductal stones. In the pancreatic head the duct measured       2.4 mm, genu of the pancreas the duct measured 3.4 mm, body of the       pancreas the duct measured 2.2 mm and tail of the pancreas the duct       measured 1.0 mm.      There was prominence of the common bile duct (3.2 mm -> 7.2 mm) and in       the common hepatic duct (9.6 mm). No evidence of any stones were found.      There was a suggestion of a relative narrowing of the lower third of the       main bile duct. Inflammatory stricture is possible.      Endosonographic imaging of the ampulla showed no intramural       (subepithelial) lesion.      Endosonographic imaging in the visualized portion of the liver showed no       mass.      No malignant-appearing lymph nodes were visualized in the celiac region       (level 20), peripancreatic region and porta hepatis region.      The celiac region was visualized. Celiac plexus block was performed. The       region of the celiac plexus and celiac ganglia was identified       endosonographically with Color Doppler imaging, using the take-off of       the celiac trunk from the anterior aspect of the aorta as the main       anatomical landmark. Color Doppler guidance was also used to confirm a  lack of significant vascular structures within the injection needle       path. Using a transgastric approach, a 20 gauge celiac plexus needle was       advanced to an injection site in the area of the celiac artery take-off.       Needle aspiration was performed prior to injection to exclude entry into       a blood vessel. A total of 20  mL of 0.25% bupivacaine and 2 mL of       triamcinolone (40 mg/mL) were injected for the celiac plexus block. The       needle was then withdrawn. Impression:               EGD impression:                           - No gross lesions in the entire esophagus. Z-line                            irregular, 43 cm from the incisors.                           - 1 cm hiatal hernia.                           - Erythematous mucosa in the stomach. Biopsied.                           - No gross lesions in the duodenal bulb, in the                            first portion of the duodenum and in the second                            portion of the duodenum.                           - Normal major papilla (could not truly identify                            sphincteromy sites).                           EUS impression:                           - Pancreatic parenchymal abnormalities consisting                            of hyperechoic foci, lobularity and hyperechoic                            strands were noted in the pancreatic head, genu of                            the pancreas, pancreatic body and pancreatic tail.                           -  The pancreatic duct had hyperechoic walls in the                            pancreatic head, genu of the pancreas, body of the                            pancreas and tail of the pancreas. No evidence of                            intraductal pancreatic stones.                           - There was prominence of the common bile duct and                            in the common hepatic duct.                           - There was a suggestion of an inflammatory                            narrowing/stricture stricture in the lower third of                            the main bile duct. His LFTs are completely normal.                           - No malignant-appearing lymph nodes were                            visualized in the celiac region (level 20),                             peripancreatic region and porta hepatis region.                           - Celiac plexus block performed as above. Recommendation:           - The patient will be observed post-procedure,                            until all discharge criteria are met.                           - Return patient to hospital ward for ongoing care.                           - Advance diet as tolerated.                           - Monitor for signs/symptoms of bleeding,                            perforation, and infection. If issues please call  our number to get further assistance as needed.                           - Observe patient's clinical course.                           - Await path results.                           - We will continue to follow to see if this is an                            effective block or not for the patient. We will                            know within the next 72 hours if we have gotten                            some effect from it. If it is effective, 4 months,                            he may be a candidate to have this performed again                            with his primary gastroenterology team at [facility].                           - Follow-up with primary gastroenterologist at [facility]                            pancreaticobiliary advanced endoscopy Dr. Normajean Glasgow.                           - The findings and recommendations were discussed                            with the patient.                           - The findings and recommendations were discussed                            with the referring physician. Procedure Code(s):        --- Professional ---                           [phone], Esophagogastroduodenoscopy, flexible,                            transoral; with transendoscopic ultrasound-guided                            transmural injection of diagnostic or therapeutic  substance(s) (eg,  anesthetic, neurolytic agent) or                            fiducial marker(s) (includes endoscopic ultrasound                            examination of the esophagus, stomach, and either                            the duodenum or a surgically altered stomach where                            the jejunum is examined distal to the anastomosis)                           43239, 59, Esophagogastroduodenoscopy, flexible,                            transoral; with biopsy, single or multiple Diagnosis Code(s):        --- Professional ---                           K22.89, Other specified disease of esophagus                           K44.9, Diaphragmatic hernia without obstruction or                            gangrene                           K31.89, Other diseases of stomach and duodenum                           K86.9, Disease of pancreas, unspecified                           I89.9, Noninfective disorder of lymphatic vessels                            and lymph nodes, unspecified                           K86.1, Other chronic pancreatitis                           R10.84, Generalized abdominal pain                           R93.3, Abnormal findings on diagnostic imaging of                            other parts of digestive tract                           K83.8, Other specified diseases of biliary tract  R93.2, Abnormal findings on diagnostic imaging of                            liver and biliary tract CPT copyright 2022 American [employer] Association. All rights reserved. The codes documented in this report are preliminary and upon coder review may  be revised to meet current compliance requirements. Corliss Parish, MD 10/20/2022 12:31:52 PM Number of Addenda: 0

## 2022-10-20 NOTE — Interval H&P Note (Signed)
History and Physical Interval Note:  10/20/2022 10:59 AM  Kyle Berry  has presented today for surgery, with the diagnosis of Chronic Pain from Chronic Pancreatitis.  The various methods of treatment have been discussed with the patient and family. After consideration of risks, benefits and other options for treatment, the patient has consented to  Procedure(s): UPPER ESOPHAGEAL ENDOSCOPIC ULTRASOUND (EUS) (N/A) as a surgical intervention.  The patient's history has been reviewed, patient examined, no change in status, stable for surgery.  I have reviewed the patient's chart and labs.  Questions were answered to the patient's satisfaction.    The risks of an EUS including intestinal perforation, bleeding, infection, aspiration, and medication effects were discussed as was the possibility it may not give a definitive diagnosis if a biopsy is performed.  When a biopsy of the pancreas is done as part of the EUS, there is an additional risk of pancreatitis at the rate of about 1-2%.  It was explained that procedure related pancreatitis is typically mild, although it can be severe and even life threatening, which is why we do not perform random pancreatic biopsies and only biopsy a lesion/area we feel is concerning enough to warrant the risk.    [employer]

## 2022-10-20 NOTE — Transfer of Care (Signed)
Immediate Anesthesia Transfer of Care Note  Patient: Kyle Berry  Procedure(s) Performed: UPPER ESOPHAGEAL ENDOSCOPIC ULTRASOUND (EUS) BIOPSY NEUROLYTIC CELIAC PLEXUS  Patient Location: PACU  Anesthesia Type:MAC  Level of Consciousness: lethargic and responds to stimulation  Airway & Oxygen Therapy: Patient Spontanous Breathing and Patient connected to nasal cannula oxygen  Post-op Assessment: Report given to RN  Post vital signs: Reviewed and stable  Last Vitals:  Vitals Value Taken Time  BP 101/67   Temp    Pulse 72 10/20/22 1217  Resp 16 10/20/22 1217  SpO2 94 % 10/20/22 1217  Vitals shown include unvalidated device data.  Last Pain:  Vitals:   10/20/22 1111  TempSrc: Temporal  PainSc: 0-No pain      Patients Stated Pain Goal: 0 (10/20/22 0232)  Complications: No notable events documented.

## 2022-10-20 NOTE — Anesthesia Procedure Notes (Signed)
Procedure Name: MAC Date/Time: 10/20/2022 11:32 AM  Performed by: De Nurse, CRNAPre-anesthesia Checklist: Patient identified, Emergency Drugs available, Suction available, Patient being monitored and Timeout performed Patient Re-evaluated:Patient Re-evaluated prior to induction Oxygen Delivery Method: Nasal cannula

## 2022-10-21 DIAGNOSIS — K861 Other chronic pancreatitis: Secondary | ICD-10-CM | POA: Diagnosis not present

## 2022-10-21 DIAGNOSIS — K860 Alcohol-induced chronic pancreatitis: Secondary | ICD-10-CM | POA: Diagnosis not present

## 2022-10-21 DIAGNOSIS — Z86010 Personal history of colonic polyps: Secondary | ICD-10-CM | POA: Diagnosis not present

## 2022-10-21 DIAGNOSIS — K219 Gastro-esophageal reflux disease without esophagitis: Secondary | ICD-10-CM | POA: Diagnosis not present

## 2022-10-21 DIAGNOSIS — K869 Disease of pancreas, unspecified: Secondary | ICD-10-CM | POA: Diagnosis not present

## 2022-10-21 DIAGNOSIS — K859 Acute pancreatitis without necrosis or infection, unspecified: Secondary | ICD-10-CM | POA: Diagnosis not present

## 2022-10-21 DIAGNOSIS — K3189 Other diseases of stomach and duodenum: Secondary | ICD-10-CM | POA: Diagnosis not present

## 2022-10-21 DIAGNOSIS — I10 Essential (primary) hypertension: Secondary | ICD-10-CM | POA: Diagnosis not present

## 2022-10-21 LAB — COMPREHENSIVE METABOLIC PANEL
ALT: 17 U/L (ref 0–44)
AST: 21 U/L (ref 15–41)
Albumin: 3 g/dL — ABNORMAL LOW (ref 3.5–5.0)
Alkaline Phosphatase: 49 U/L (ref 38–126)
Anion gap: 8 (ref 5–15)
BUN: 12 mg/dL (ref 6–20)
CO2: 22 mmol/L (ref 22–32)
Calcium: 8.5 mg/dL — ABNORMAL LOW (ref 8.9–10.3)
Chloride: 103 mmol/L (ref 98–111)
Creatinine, Ser: 1.25 mg/dL — ABNORMAL HIGH (ref 0.61–1.24)
GFR, Estimated: >60 mL/min (ref >60)
Glucose, Bld: 164 mg/dL — ABNORMAL HIGH (ref 70–99)
Potassium: 3.8 mmol/L (ref 3.5–5.1)
Sodium: 133 mmol/L — ABNORMAL LOW (ref 135–145)
Total Bilirubin: 0.3 mg/dL (ref 0.3–1.2)
Total Protein: 5.9 g/dL — ABNORMAL LOW (ref 6.5–8.1)

## 2022-10-21 LAB — MAGNESIUM: Magnesium: 1.9 mg/dL (ref 1.7–2.4)

## 2022-10-21 LAB — CBC
HCT: 35.4 % — ABNORMAL LOW (ref 39.0–52.0)
Hemoglobin: 11.5 g/dL — ABNORMAL LOW (ref 13.0–17.0)
MCH: 33.8 pg (ref 26.0–34.0)
MCHC: 32.5 g/dL (ref 30.0–36.0)
MCV: 104.1 fL — ABNORMAL HIGH (ref 80.0–100.0)
Platelets: 141 10*3/uL — ABNORMAL LOW (ref 150–400)
RBC: 3.4 MIL/uL — ABNORMAL LOW (ref 4.22–5.81)
RDW: 15 % (ref 11.5–15.5)
WBC: 6.6 10*3/uL (ref 4.0–10.5)
nRBC: 0 % (ref 0.0–0.2)

## 2022-10-21 MED ORDER — PANCRELIPASE (LIP-PROT-AMYL) 36000-114000 UNITS PO CPEP: 36000.0000 [IU] | ORAL_CAPSULE | Freq: Three times a day (TID) | ORAL | Status: DC | PRN

## 2022-10-21 MED ORDER — PANCRELIPASE (LIP-PROT-AMYL) 36000-114000 UNITS PO CPEP
72000.0000 [IU] | ORAL_CAPSULE | Freq: Three times a day (TID) | ORAL | Status: DC
  Administered 2022-10-22 – 2022-10-23 (×5): 72000 [IU] via ORAL
  Filled 2022-10-21 (×5): qty 2

## 2022-10-21 NOTE — Progress Notes (Signed)
PROGRESS NOTE    Kyle Berry  NWG:[id number] DOB: August 10, 1970 DOA: 10/12/2022 PCP: [facility]    Chief Complaint  Patient presents with   Abdominal Pain    Brief Narrative:  52 y.o. male with medical history significant of gastroparesis, GERD, chronic pancreatitis on Creon who presented to the emergency department with recurrent abdominal pain.  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  He intermittently drinks alcohol however states it has been several weeks.  This afternoon he had intractable abdominal pain nausea and vomiting consistent with his prior pancreatitis flares.  He presented to the emergency department further assessment.  ER evaluation revealed elevated lipase.  Remaining labs showed bicarb 19, WBC 8.0, hemoglobin 13.7.  Patient just had a CT scan 2 weeks ago which showed acute pancreatitis after which she was discharged.  Patient was given IV fluids and admitted for further workup      Assessment & Plan:   Principal Problem:   Acute on chronic pancreatitis [facility]) Active Problems:   Essential hypertension   Esophageal reflux   Malnutrition of moderate degree   Chronic abdominal pain   Mild protein-calorie malnutrition (HCC)   Other chronic pain   Chronic alcoholic pancreatitis (HCC)  #1 acute on chronic idiopathic pancreatitis, POA -Patient presented with worsening abdominal pain nausea and vomiting consistent with symptoms from prior pancreatitis flares. -Lipase levels noted on admission elevated at 317. -Patient of present patient had not drank alcohol in approximately 2 weeks. -Alcohol level on admission < 10. -Patient noted to be followed by [facility] and had prior biliary stenting placed with a history of pancreatic divisum. -Lipase levels trending down currently at 49 from 87 from 317. -CT abdomen and pelvis with findings of mild improvement of acute pancreatitis with no evidence of  pancreatic necrosis, pseudocyst or other complication. -Patient was on clear liquids and when diet was advanced to a full liquid diet unable to tolerate with worsening abdominal pain and nausea and as such diet downgraded back to clear liquids.. -Continue IV fluids, continue Creon supplementation.  Continue current pain management. -Patient seen in consultation by GI diet advanced to full liquid diet on 10/18/2022 to see how patient tolerated it.  -GI recommending possible celiac plexus block hopefully to be done 10/19/2022, to see if this may decrease patient's pain threshold which will allow advancement of his diet.   -Patient was taken down for procedure 10/19/2022 however had received Lovenox that morning and as such procedure canceled and rescheduled for 10/20/2022. -Patient underwent procedure of EUS with celiac plexus block on 10/20/2022 without any complications. -Patient placed on a soft diet and has had some nausea some abdominal pain but no emesis and able to keep food down. -Per GI clinical improvement should be noticed within 72 hours of procedure. -Continue current diet, IV fluids, pain management. -Per GI if patient does not have clinical improvement despite celiac block, and unable to tolerate oral intake then may need cortrack or NG tube placement for nutrition versus TPN.   -GI following and appreciate their input and recommendations.  2.  Hypertension -ARB held on admission as patient had presented with concerns for acute pancreatitis.   -Coreg 3.125 mg twice daily.    3.  GERD -PPI.    4.  Moderate protein calorie malnutrition -Dietitian following.  5.  Hypomagnesemia -Repleted.   -Magnesium at 1.9.   DVT prophylaxis: Lovenox Code Status: FullFull Family Communication: Updated patient.  No family  at bedside. Disposition: Home when clinically improved, tolerating diet, and cleared by GI.  Status is: Inpatient Remains inpatient appropriate because: Severity of illness    Consultants:  Gastroenterology: Dr. Meridee Score 10/17/2022  Procedures:  CT abdomen and pelvis 10/14/2022 Upper EUS with celiac plexus block: Per GI: Dr. Meridee Score 10/20/2022  Antimicrobials:  Anti-infectives (From admission, onward)    None         Subjective: Operating room.  Denies any chest pain or shortness of breath.  Complaining of lower back pain after procedure yesterday.  Currently on a soft diet which she seems to be tolerating with some bouts of nausea but no emesis.  Still with epigastric abdominal pain but slightly improved postprocedure.    Objective: Vitals:   10/20/22 1514 10/20/22 2103 10/21/22 0407 10/21/22 0807  BP: (!) 153/99 133/82 (!) 142/84 (!) 162/96  Pulse: 79 72 72 81  Resp: 19 15 15 16   Temp: 98.4 F (36.9 C) 98.3 F (36.8 C) 98.3 F (36.8 C) 99.1 F (37.3 C)  TempSrc: Oral Oral Oral   SpO2: 100% 98% 98% 100%  Weight:      Height:        Intake/Output Summary (Last 24 hours) at 10/21/2022 1301 Last data filed at 10/21/2022 0844 Gross per 24 hour  Intake 3453.39 ml  Output 1600 ml  Net 1853.39 ml    Filed Weights   10/16/22 0454 10/19/22 0901 10/20/22 1111  Weight: 70.2 kg 73 kg 73 kg    Examination:  General exam: NAD. Respiratory system: Lungs clear to auscultation bilaterally.  No wheezes, no crackles, no rhonchi.  Fair air movement.  Speaking in full sentences.   Cardiovascular system: RRR no murmurs rubs or gallops.  No JVD.  No lower extremity edema.  Gastrointestinal system: Abdomen is soft, nondistended, decreased tenderness to palpation epigastric region.  Positive bowel sounds.  No rebound, no guarding.    Central nervous system: Alert and oriented. No focal neurological deficits. Extremities: Symmetric 5 x 5 power. Skin: No rashes, lesions or ulcers Psychiatry: Judgement and insight appear normal. Mood & affect appropriate.     Data Reviewed: I have personally reviewed following labs and imaging studies  CBC: Recent Labs   Lab 10/17/22 1117 10/18/22 0410 10/19/22 0157 10/20/22 0059 10/21/22 0109  WBC 4.3 4.0 3.7* 4.5 6.6  NEUTROABS 2.0  --   --   --   --   HGB 12.0* 11.4* 10.6* 11.7* 11.5*  HCT 36.2* 33.9* 32.1* 35.4* 35.4*  MCV 102.3* 101.5* 102.6* 104.4* 104.1*  PLT 144* 141* 139* 148* 141*     Basic Metabolic Panel: Recent Labs  Lab 10/17/22 1117 10/18/22 0410 10/19/22 0157 10/20/22 0059 10/21/22 0109  NA 133* 138 135 135 133*  K 3.7 3.8 4.0 3.7 3.8  CL 99 102 103 101 103  CO2 26 28 22 27 22   GLUCOSE 138* 112* 138* 136* 164*  BUN <5* 5* 8 8 12   CREATININE 1.08 1.02 1.25* 1.09 1.25*  CALCIUM 8.9 8.7* 8.7* 8.9 8.5*  MG 1.5* 2.0 1.7 1.8 1.9     GFR: Estimated Creatinine Clearance: 70.6 mL/min (A) (by C-G formula based on SCr of 1.25 mg/dL (H)).  Liver Function Tests: Recent Labs  Lab 10/17/22 1117 10/18/22 0410 10/19/22 0157 10/20/22 0059 10/21/22 0109  AST 23 19 21 20 21   ALT 18 16 14 16 17   ALKPHOS 57 47 61 59 49  BILITOT 0.7 0.7 0.6 0.5 0.3  PROT 6.4* 6.1* 5.8* 6.4* 5.9*  ALBUMIN 3.2* 3.1* 2.9* 3.2* 3.0*     CBG: No results for input(s): "GLUCAP" in the last 168 hours.   No results found for this or any previous visit (from the past 240 hour(s)).       Radiology Studies: No results found.      Scheduled Meds:  (feeding supplement) PROSource Plus  30 mL Oral TID BM   carvedilol  3.125 mg Oral BID WC   feeding supplement  1 Container Oral TID BM   gabapentin  300 mg Oral TID   lipase/protease/amylase  24,000 Units Oral TID WC   pantoprazole  40 mg Oral Daily   sucralfate  1 g Oral BID   Continuous Infusions:  lactated ringers 200 mL/hr at 10/21/22 0429   promethazine (PHENERGAN) injection (IM or IVPB) 12.5 mg (10/19/22 0214)     LOS: 9 days    Time spent: 35 minutes    Ramiro Harvest, MD [facility]   To contact the attending provider between 7A-7P or the covering provider during after hours 7P-7A, please log into the web site  [email or web address] and access using universal [place] password for that web site. If you do not have the password, please call the hospital operator.  10/21/2022, 1:01 PM

## 2022-10-21 NOTE — Progress Notes (Signed)
Gastroenterology Inpatient Follow-up Note   PATIENT IDENTIFICATION  Kyle Berry is a 52 y.o. male Hospital Day: 10  SUBJECTIVE  The patient's chart has been reviewed. The patient's labs have been reviewed. Patient states that he is abdominal discomfort is slightly better than yesterday but he is still experiencing issues. Thankfully he has been able to tolerate full oral intake. Abdominal discomfort today 5 out of 10.   No new fevers or chills.   OBJECTIVE  Scheduled Inpatient Medications:   (feeding supplement) PROSource Plus  30 mL Oral TID BM   carvedilol  3.125 mg Oral BID WC   feeding supplement  1 Container Oral TID BM   gabapentin  300 mg Oral TID   lipase/protease/amylase  24,000 Units Oral TID WC   pantoprazole  40 mg Oral Daily   sucralfate  1 g Oral BID   Continuous Inpatient Infusions:   lactated ringers 200 mL/hr at 10/21/22 0429   promethazine (PHENERGAN) injection (IM or IVPB) 12.5 mg (10/19/22 0214)   PRN Inpatient Medications: acetaminophen **OR** acetaminophen, hydrALAZINE, HYDROcodone-acetaminophen, HYDROmorphone (DILAUDID) injection, mouth rinse, promethazine (PHENERGAN) injection (IM or IVPB)   Physical Examination  Temp:  [98.3 F (36.8 C)-99.1 F (37.3 C)] 99.1 F (37.3 C) (07/07 0807) Pulse Rate:  [72-81] 81 (07/07 0807) Resp:  [15-19] 16 (07/07 0807) BP: (133-162)/(82-99) 162/96 (07/07 0807) SpO2:  [98 %-100 %] 100 % (07/07 0807) Temp (24hrs), Avg:98.5 F (36.9 C), Min:98.3 F (36.8 C), Max:99.1 F (37.3 C)  Weight: 73 kg GEN: NAD (looks very comfortable), appears stated age, doesn't appear chronically ill PSYCH: Cooperative, without pressured speech EYE: Conjunctivae pink, sclerae anicteric ENT: MMM CV: Nontachycardic RESP: No audible wheezing GI: NABS, soft, rounded, TTP in MEG upon deep palpation, volitional guarding present, no rebound MSK/EXT: No significant lower extremity edema SKIN: No jaundice NEURO:  Alert & Oriented  x 3, no focal deficits   Review of Data   Laboratory Studies   Recent Labs  Lab 10/21/22 0109  NA 133*  K 3.8  CL 103  CO2 22  BUN 12  CREATININE 1.25*  GLUCOSE 164*  CALCIUM 8.5*  MG 1.9   Recent Labs  Lab 10/21/22 0109  AST 21  ALT 17  ALKPHOS 49    Recent Labs  Lab 10/19/22 0157 10/20/22 0059 10/21/22 0109  WBC 3.7* 4.5 6.6  HGB 10.6* 11.7* 11.5*  HCT 32.1* 35.4* 35.4*  PLT 139* 148* 141*   Recent Labs  Lab 10/19/22 0157  INR 1.0    Imaging Studies  No results found.  GI Procedures and Studies  No new relevant studies to review   ASSESSMENT  Kyle Berry is a 52 y.o. 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  Patient admitted with recurrent acute on chronic pancreatitis with persisting abdominal pain (GI consulted on day 5 to assist with his continued abdominal pain/discomfort and intolerance to oral intake.  The patient is hemodynamically and clinically stable.  Will know within the next 1 to 2 days whether the patient has further improvement with the celiac block.  Hopefully he does.  I am adjusting his Creon dosing as noted below to try to see if we can get further improvement in his pain (we will get that in some individuals as we titrate up the PERT therapy).  Unfortunately there is nothing more that we can offer at this point.  If he  continues to have issues from a pain perspective, would recommend pain specialty evaluation.  He needs to follow-up with Dr. Normajean Glasgow at [facility].  All patient questions were answered to the best of my ability, and the patient agrees to the aforementioned plan of action with follow-up as indicated.   PLAN/RECOMMENDATIONS  Heart healthy diet is going well Increase Creon - 72,000 units with each meal - 36,000 units with each snack Pain control as per our medical  team Complete alcohol abstinence If issues with pain control continues to be an issue, please reach out to pain medicine Patient's follow-up will be with [facility]   Inpatient GI service will sign off at this time, please page/call with questions or concerns.   Corliss Parish, MD [facility] Office # [phone]    LOS: 9 days  [name]  10/21/2022, 2:51 PM

## 2022-10-22 DIAGNOSIS — I10 Essential (primary) hypertension: Secondary | ICD-10-CM | POA: Diagnosis not present

## 2022-10-22 DIAGNOSIS — K219 Gastro-esophageal reflux disease without esophagitis: Secondary | ICD-10-CM | POA: Diagnosis not present

## 2022-10-22 DIAGNOSIS — K859 Acute pancreatitis without necrosis or infection, unspecified: Secondary | ICD-10-CM | POA: Diagnosis not present

## 2022-10-22 DIAGNOSIS — K861 Other chronic pancreatitis: Secondary | ICD-10-CM | POA: Diagnosis not present

## 2022-10-22 LAB — CBC
HCT: 34.1 % — ABNORMAL LOW (ref 39.0–52.0)
Hemoglobin: 11.2 g/dL — ABNORMAL LOW (ref 13.0–17.0)
MCH: 34.6 pg — ABNORMAL HIGH (ref 26.0–34.0)
MCHC: 32.8 g/dL (ref 30.0–36.0)
MCV: 105.2 fL — ABNORMAL HIGH (ref 80.0–100.0)
Platelets: 159 10*3/uL (ref 150–400)
RBC: 3.24 MIL/uL — ABNORMAL LOW (ref 4.22–5.81)
RDW: 15.1 % (ref 11.5–15.5)
WBC: 6 10*3/uL (ref 4.0–10.5)
nRBC: 0 % (ref 0.0–0.2)

## 2022-10-22 LAB — COMPREHENSIVE METABOLIC PANEL
ALT: 19 U/L (ref 0–44)
AST: 27 U/L (ref 15–41)
Albumin: 3 g/dL — ABNORMAL LOW (ref 3.5–5.0)
Alkaline Phosphatase: 44 U/L (ref 38–126)
Anion gap: 14 (ref 5–15)
BUN: 13 mg/dL (ref 6–20)
CO2: 21 mmol/L — ABNORMAL LOW (ref 22–32)
Calcium: 9.1 mg/dL (ref 8.9–10.3)
Chloride: 103 mmol/L (ref 98–111)
Creatinine, Ser: 0.95 mg/dL (ref 0.61–1.24)
GFR, Estimated: >60 mL/min (ref >60)
Glucose, Bld: 182 mg/dL — ABNORMAL HIGH (ref 70–99)
Potassium: 4 mmol/L (ref 3.5–5.1)
Sodium: 138 mmol/L (ref 135–145)
Total Bilirubin: 0.6 mg/dL (ref 0.3–1.2)
Total Protein: 5.6 g/dL — ABNORMAL LOW (ref 6.5–8.1)

## 2022-10-22 MED ORDER — SODIUM CHLORIDE 0.9 % IV SOLN: INTRAVENOUS | Status: DC | PRN

## 2022-10-22 NOTE — Plan of Care (Signed)
  Problem: Clinical Measurements: Goal: Respiratory complications will improve Outcome: Progressing Goal: Cardiovascular complication will be avoided Outcome: Progressing   Problem: Activity: Goal: Risk for activity intolerance will decrease Outcome: Progressing   Problem: Nutrition: Goal: Adequate nutrition will be maintained Outcome: Progressing   Problem: Elimination: Goal: Will not experience complications related to bowel motility Outcome: Progressing Goal: Will not experience complications related to urinary retention Outcome: Progressing   

## 2022-10-22 NOTE — Progress Notes (Signed)
PROGRESS NOTE    Kyle Berry  ZOX:[id number] DOB: [birth year] DOA: 10/12/2022 PCP: [facility]    Chief Complaint  Patient presents with   Abdominal Pain    Brief Narrative:  52 y.o. male with medical history significant of gastroparesis, GERD, chronic pancreatitis on Creon who presented to the emergency department with recurrent abdominal pain.  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  He intermittently drinks alcohol however states it has been several weeks.  This afternoon he had intractable abdominal pain nausea and vomiting consistent with his prior pancreatitis flares.  He presented to the emergency department further assessment.  ER evaluation revealed elevated lipase.  Remaining labs showed bicarb 19, WBC 8.0, hemoglobin 13.7.  Patient just had a CT scan 2 weeks ago which showed acute pancreatitis after which she was discharged.  Patient was given IV fluids and admitted for further workup      Assessment & Plan:   Principal Problem:   Acute on chronic pancreatitis [facility]) Active Problems:   Essential hypertension   Esophageal reflux   Malnutrition of moderate degree   Chronic abdominal pain   Mild protein-calorie malnutrition (HCC)   Other chronic pain   Chronic alcoholic pancreatitis (HCC)  #1 acute on chronic idiopathic pancreatitis, POA -Patient presented with worsening abdominal pain nausea and vomiting consistent with symptoms from prior pancreatitis flares. -Lipase levels noted on admission elevated at 317. -Patient of present patient had not drank alcohol in approximately 2 weeks. -Alcohol level on admission < 10. -Patient noted to be followed by [facility] and had prior biliary stenting placed with a history of pancreatic divisum. -Lipase levels trending down currently at 49 from 87 from 317. -CT abdomen and pelvis with findings of mild improvement of acute pancreatitis with no evidence of  pancreatic necrosis, pseudocyst or other complication. -Patient was on clear liquids and when diet was advanced to a full liquid diet unable to tolerate with worsening abdominal pain and nausea and as such diet downgraded back to clear liquids.. -Continue IV fluids, continue Creon supplementation.  Continue current pain management. -Patient seen in consultation by GI diet advanced to full liquid diet on 10/18/2022 to see how patient tolerated it.  -GI recommending possible celiac plexus block hopefully to be done 10/19/2022, to see if this may decrease patient's pain threshold which will allow advancement of his diet.   -Patient was taken down for procedure 10/19/2022 however had received Lovenox that morning and as such procedure canceled and rescheduled for 10/20/2022. -Patient underwent procedure of EUS with celiac plexus block on 10/20/2022 without any complications. -Patient placed on a soft diet and has had some nausea some abdominal pain but no emesis and able to keep food down. -Per GI clinical improvement should be noticed within 72 hours of procedure. -Continue current diet, IV fluids, pain management. -Per GI if patient does not have clinical improvement despite celiac block, and unable to tolerate oral intake then may need cortrack or NG tube placement for nutrition versus TPN.   -If patient improves clinically post celiac block, tolerating oral intake will need close outpatient follow-up with his primary gastroenterologist at [facility]. -GI following and appreciate their input and recommendations.  2.  Hypertension -ARB held on admission as patient had presented with concerns for acute pancreatitis.   -Continue current regimen of Coreg 3.125 mg twice daily.   3.  GERD -Continue PPI.  4.  Moderate protein calorie malnutrition -Dietitian following.  5.  Hypomagnesemia -Repleted.    DVT prophylaxis: Lovenox Code Status: FullFull Family Communication: Updated patient.  No family at  bedside. Disposition: Home when clinically improved, tolerating diet, hopefully in the next 24 hours.   Status is: Inpatient Remains inpatient appropriate because: Severity of illness   Consultants:  Gastroenterology: Dr. Meridee Score 10/17/2022  Procedures:  CT abdomen and pelvis 10/14/2022 Upper EUS with celiac plexus block: Per GI: Dr. Meridee Score 10/20/2022  Antimicrobials:  Anti-infectives (From admission, onward)    None         Subjective: Laying in bed.  Denies any chest pain or shortness of breath.  Able to tolerate current diet with no emesis.  Did have some nausea.  Abdominal pain slowly improving.    Objective: Vitals:   10/21/22 2110 10/22/22 0447 10/22/22 0817 10/22/22 1517  BP: (!) 155/89 (!) 156/90 (!) 141/86 (!) 145/85  Pulse: 67 63 68 65  Resp: 18 15 19 19   Temp: 98.5 F (36.9 C) 98 F (36.7 C) 98.3 F (36.8 C) 98.5 F (36.9 C)  TempSrc: Oral     SpO2: 98% 98% 95% 100%  Weight: 73.3 kg     Height:        Intake/Output Summary (Last 24 hours) at 10/22/2022 1912 Last data filed at 10/22/2022 1700 Gross per 24 hour  Intake 6642.31 ml  Output 2575 ml  Net 4067.31 ml    Filed Weights   10/19/22 0901 10/20/22 1111 10/21/22 2110  Weight: 73 kg 73 kg 73.3 kg    Examination:  General exam: NAD. Respiratory system: CTAB.  No wheezes, no crackles, no rhonchi.  Fair air movement.  Speaking in full sentences.  Cardiovascular system: Regular rate and rhythm no murmurs rubs or gallops.  No JVD.  No lower extremity edema.   Gastrointestinal system: Abdomen is soft, nondistended, decreased tenderness to palpation epigastric region.  Positive bowel sounds.  No rebound.  No guarding.  Central nervous system: Alert and oriented. No focal neurological deficits. Extremities: Symmetric 5 x 5 power. Skin: No rashes, lesions or ulcers Psychiatry: Judgement and insight appear normal. Mood & affect appropriate.     Data Reviewed: I have personally reviewed following  labs and imaging studies  CBC: Recent Labs  Lab 10/17/22 1117 10/18/22 0410 10/19/22 0157 10/20/22 0059 10/21/22 0109 10/22/22 0848  WBC 4.3 4.0 3.7* 4.5 6.6 6.0  NEUTROABS 2.0  --   --   --   --   --   HGB 12.0* 11.4* 10.6* 11.7* 11.5* 11.2*  HCT 36.2* 33.9* 32.1* 35.4* 35.4* 34.1*  MCV 102.3* 101.5* 102.6* 104.4* 104.1* 105.2*  PLT 144* 141* 139* 148* 141* 159     Basic Metabolic Panel: Recent Labs  Lab 10/17/22 1117 10/18/22 0410 10/19/22 0157 10/20/22 0059 10/21/22 0109 10/22/22 0848  NA 133* 138 135 135 133* 138  K 3.7 3.8 4.0 3.7 3.8 4.0  CL 99 102 103 101 103 103  CO2 26 28 22 27 22  21*  GLUCOSE 138* 112* 138* 136* 164* 182*  BUN <5* 5* 8 8 12 13   CREATININE 1.08 1.02 1.25* 1.09 1.25* 0.95  CALCIUM 8.9 8.7* 8.7* 8.9 8.5* 9.1  MG 1.5* 2.0 1.7 1.8 1.9  --      GFR: Estimated Creatinine Clearance: 93.2 mL/min (by C-G formula based on SCr of 0.95 mg/dL).  Liver Function Tests: Recent Labs  Lab 10/18/22 0410 10/19/22 0157 10/20/22 0059 10/21/22 0109 10/22/22 0848  AST 19 21 20 21 27   ALT 16 14 16  17 19  ALKPHOS 47 61 59 49 44  BILITOT 0.7 0.6 0.5 0.3 0.6  PROT 6.1* 5.8* 6.4* 5.9* 5.6*  ALBUMIN 3.1* 2.9* 3.2* 3.0* 3.0*     CBG: No results for input(s): "GLUCAP" in the last 168 hours.   No results found for this or any previous visit (from the past 240 hour(s)).       Radiology Studies: No results found.      Scheduled Meds:  (feeding supplement) PROSource Plus  30 mL Oral TID BM   carvedilol  3.125 mg Oral BID WC   feeding supplement  1 Container Oral TID BM   gabapentin  300 mg Oral TID   lipase/protease/amylase  72,000 Units Oral TID with meals   pantoprazole  40 mg Oral Daily   sucralfate  1 g Oral BID   Continuous Infusions:  lactated ringers 125 mL/hr at 10/22/22 1806   promethazine (PHENERGAN) injection (IM or IVPB) 12.5 mg (10/22/22 0335)     LOS: 10 days    Time spent: 35 minutes    Ramiro Harvest, MD [facility]   To contact the attending provider between 7A-7P or the covering provider during after hours 7P-7A, please log into the web site [email or web address] and access using universal [place] password for that web site. If you do not have the password, please call the hospital operator.  10/22/2022, 7:12 PM

## 2022-10-23 ENCOUNTER — Encounter (HOSPITAL_COMMUNITY): Payer: Self-pay | Admitting: Gastroenterology

## 2022-10-23 ENCOUNTER — Encounter: Payer: Self-pay | Admitting: Gastroenterology

## 2022-10-23 DIAGNOSIS — I10 Essential (primary) hypertension: Secondary | ICD-10-CM | POA: Diagnosis not present

## 2022-10-23 DIAGNOSIS — K859 Acute pancreatitis without necrosis or infection, unspecified: Secondary | ICD-10-CM | POA: Diagnosis not present

## 2022-10-23 DIAGNOSIS — K861 Other chronic pancreatitis: Secondary | ICD-10-CM | POA: Diagnosis not present

## 2022-10-23 DIAGNOSIS — E441 Mild protein-calorie malnutrition: Secondary | ICD-10-CM

## 2022-10-23 DIAGNOSIS — R109 Unspecified abdominal pain: Secondary | ICD-10-CM

## 2022-10-23 DIAGNOSIS — G8929 Other chronic pain: Secondary | ICD-10-CM

## 2022-10-23 DIAGNOSIS — E440 Moderate protein-calorie malnutrition: Secondary | ICD-10-CM | POA: Diagnosis not present

## 2022-10-23 LAB — BASIC METABOLIC PANEL
Anion gap: 9 (ref 5–15)
BUN: 15 mg/dL (ref 6–20)
CO2: 23 mmol/L (ref 22–32)
Calcium: 9 mg/dL (ref 8.9–10.3)
Chloride: 106 mmol/L (ref 98–111)
Creatinine, Ser: 0.96 mg/dL (ref 0.61–1.24)
GFR, Estimated: >60 mL/min (ref >60)
Glucose, Bld: 131 mg/dL — ABNORMAL HIGH (ref 70–99)
Potassium: 3.9 mmol/L (ref 3.5–5.1)
Sodium: 138 mmol/L (ref 135–145)

## 2022-10-23 LAB — SURGICAL PATHOLOGY

## 2022-10-23 MED ORDER — PANCRELIPASE (LIP-PROT-AMYL) 36000-114000 UNITS PO CPEP: 36000.0000 [IU] | ORAL_CAPSULE | Freq: Three times a day (TID) | ORAL | 1 refills | Status: DC | PRN

## 2022-10-23 MED ORDER — HYDROCODONE-ACETAMINOPHEN 5-325 MG PO TABS: 1.0000 | ORAL_TABLET | ORAL | 0 refills | Status: DC | PRN

## 2022-10-23 MED ORDER — PANCRELIPASE (LIP-PROT-AMYL) 36000-114000 UNITS PO CPEP: 72000.0000 [IU] | ORAL_CAPSULE | Freq: Three times a day (TID) | ORAL | 1 refills | Status: DC

## 2022-10-23 MED ORDER — PROMETHAZINE HCL 25 MG PO TABS: 25.0000 mg | ORAL_TABLET | Freq: Four times a day (QID) | ORAL | 1 refills | Status: DC | PRN

## 2022-10-23 MED ORDER — FAMOTIDINE 20 MG PO TABS: ORAL_TABLET | ORAL | 2 refills | Status: DC

## 2022-10-23 MED ORDER — SUCRALFATE 1 G PO TABS: 1.0000 g | ORAL_TABLET | Freq: Two times a day (BID) | ORAL | 0 refills | Status: DC

## 2022-10-23 MED ORDER — CARVEDILOL 3.125 MG PO TABS: 3.1250 mg | ORAL_TABLET | Freq: Two times a day (BID) | ORAL | 1 refills | Status: AC

## 2022-10-23 NOTE — Discharge Summary (Signed)
Physician Discharge Summary  Kyle Berry ZOX:[id number] DOB: 1971/02/03 DOA: 10/12/2022  PCP: [facility]  Admit date: 10/12/2022 Discharge date: 10/23/2022  Time spent: 60 minutes  Recommendations for Outpatient Follow-up:  Follow-up with [facility] in 2 weeks.  On follow-up patient blood pressure need to be reassessed as patient's ARB was discontinued patient started on Coreg twice daily.  Patient will need a basic metabolic profile and magnesium level done to follow-up on electrolytes and renal function. Follow-up with primary gastroenterologist at [facility], Dr.Kothari in 1 to 2 weeks.   Discharge Diagnoses:  Principal Problem:   Acute on chronic pancreatitis [facility]) Active Problems:   Essential hypertension   Esophageal reflux   Malnutrition of moderate degree   Chronic abdominal pain   Mild protein-calorie malnutrition (HCC)   Other chronic pain   Chronic alcoholic pancreatitis [facility])   Discharge Condition: Stable and improved  Diet recommendation: Heart healthy  Filed Weights   10/19/22 0901 10/20/22 1111 10/21/22 2110  Weight: 73 kg 73 kg 73.3 kg    History of present illness:  HPI per Dr. Moody Bruins is a 52 y.o. male with medical history significant of gastroparesis, GERD, chronic pancreatitis on Creon who presented to the emergency department with recurrent abdominal pain.  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  He intermittently drinks alcohol however states it has been several weeks.  This afternoon he had intractable abdominal pain nausea and vomiting consistent with his prior pancreatitis flares.  He presented to the emergency department further assessment.  ER evaluation revealed elevated lipase.  Remaining labs showed bicarb 19, WBC 8.0, hemoglobin 13.7.  Patient just had a CT scan 2 weeks ago which showed acute pancreatitis after which she was  discharged.  Patient was given IV fluids and admitted for further workup.  Of note, he seen GI in the past and has a history of pancreatic divisum.  He remains on several medications that are known to cause his medication induced pancreatitis including angiotensin receptor blocker.   On admission he was endorsing persistent abdominal pain.  He denied other complaints.  Hospital Course:  #1 acute on chronic idiopathic pancreatitis, POA -Patient presented with worsening abdominal pain nausea and vomiting consistent with symptoms from prior pancreatitis flares. -Lipase levels noted on admission elevated at 317. -Patient on presentation had not drank alcohol in approximately 2 weeks. -Alcohol level on admission < 10. -Patient noted to be followed by [facility] and had prior biliary stenting placed with a history of pancreatic divisum. -Lipase levels trending down currently at 49 from 87 from 317. -CT abdomen and pelvis with findings of mild improvement of acute pancreatitis with no evidence of pancreatic necrosis, pseudocyst or other complication. -Patient was on clear liquids and when diet was advanced to a full liquid diet unable to tolerate with worsening abdominal pain and nausea and as such diet downgraded back to clear liquids.. -Continue IV fluids, continue Creon supplementation.  Continue current pain management. -Patient seen in consultation by GI diet advanced to full liquid diet on 10/18/2022 to see how patient tolerated it.  -GI recommending possible celiac plexus block hopefully to be done 10/19/2022, to see if this may decrease patient's pain threshold which will allow advancement of his diet.   -Patient was taken down for procedure 10/19/2022 however had received Lovenox that morning and as such procedure canceled and rescheduled for 10/20/2022. -Patient underwent procedure of EUS with celiac  plexus block on 10/20/2022 without any complications. -Patient placed on a soft diet and has had some nausea  some abdominal pain but no emesis and able to keep food down. -Per GI clinical improvement should be noticed within 72 hours of procedure. -Patient maintained on heart healthy diet which she tolerated, IV fluids and pain management.   -As patient improved clinically was able to tolerate and keep heart healthy diet down he will be discharged in stable improved condition with close outpatient follow-up with his [facility] gastroenterologist.   -Patient was discharged in stable and improved condition.     2.  Hypertension -ARB held on admission as patient had presented with concerns for acute pancreatitis.   -Patient placed on Coreg 3.125 mg twice daily for blood pressure control which she will be discharged on. -ARB will be discontinued on discharge. -Outpatient follow-up with PCP.   3.  GERD -Patient maintained on PPI and Pepcid during the hospitalization. -Patient discharged on Pepcid 20 mg twice daily x 1 month and then 20 mg daily per GI recommendations.   4.  Moderate protein calorie malnutrition -Dietitian following.   5.  Hypomagnesemia -Repleted.   -Outpatient follow-up.      Procedures: CT abdomen and pelvis 10/14/2022 Upper EUS with celiac plexus block: Per GI: Dr. Meridee Score 10/20/2022    Consultations: Gastroenterology: Dr. Meridee Score 10/17/2022   Discharge Exam: Vitals:   10/23/22 0351 10/23/22 0919  BP: 134/83 (!) 154/86  Pulse: (!) 58 66  Resp: 20 16  Temp: 98.1 F (36.7 C) 98.7 F (37.1 C)  SpO2: 97% 99%    General: NAD Cardiovascular: RRR no murmurs rubs or gallops.  No JVD.  No lower extremity edema. Respiratory: Clear to auscultation bilaterally.  No wheezes, no crackles, no rhonchi.  Fair air movement.  Speaking in full sentences.  Discharge Instructions   Discharge Instructions     Diet - low sodium heart healthy   Complete by: As directed    Increase activity slowly   Complete by: As directed       Allergies as of 10/23/2022       Reactions    Norvasc [amlodipine Besylate] Other (See Comments)   Fluid buildup in chest   Voltaren [diclofenac] Hives, Other (See Comments)   Fluid buildup in chest   Prilosec [omeprazole] Other (See Comments)   MD stopped due to pancreatitis   Hydrochlorothiazide Itching   Ultram [tramadol] Other (See Comments)   Stomach upset   Prednisone Hives, Other (See Comments)   Mood swings        Medication List     STOP taking these medications    ondansetron 4 MG tablet Commonly known as: ZOFRAN   pantoprazole 40 MG tablet Commonly known as: PROTONIX   telmisartan 40 MG tablet Commonly known as: MICARDIS   thiamine 100 MG tablet Commonly known as: VITAMIN B1   traMADol 50 MG tablet Commonly known as: ULTRAM       TAKE these medications    acetaminophen 325 MG tablet Commonly known as: TYLENOL Take 650 mg by mouth daily as needed for moderate pain, headache or fever.   albuterol 108 (90 Base) MCG/ACT inhaler Commonly known as: VENTOLIN HFA Inhale 2 puffs into the lungs every 6 (six) hours as needed for wheezing or shortness of breath.   carvedilol 3.125 MG tablet Commonly known as: COREG Take 1 tablet (3.125 mg total) by mouth 2 (two) times daily with a meal.   famotidine 20 MG tablet Commonly known  as: PEPCID Take 1 tablet (20 mg total) by mouth 2 (two) times daily for 30 days, THEN 1 tablet (20 mg total) daily. Start taking on: October 23, 2022 What changed: See the new instructions.   folic acid 1 MG tablet Commonly known as: FOLVITE Take 1 tablet (1 mg total) by mouth daily.   gabapentin 100 MG capsule Commonly known as: NEURONTIN Take 1 capsule (100 mg total) by mouth 3 (three) times daily.   HYDROcodone-acetaminophen 5-325 MG tablet Commonly known as: NORCO/VICODIN Take 1-2 tablets by mouth every 4 (four) hours as needed for moderate pain.   lipase/protease/amylase 16109 UNITS Cpep capsule Commonly known as: CREON Take 2 capsules (72,000 Units total) by mouth  with breakfast, with lunch, and with evening meal. What changed:  medication strength how much to take when to take this   lipase/protease/amylase 60454 UNITS Cpep capsule Commonly known as: CREON Take 1 capsule (36,000 Units total) by mouth 3 (three) times daily between meals as needed (with snacks). What changed: You were already taking a medication with the same name, and this prescription was added. Make sure you understand how and when to take each.   naloxone 4 MG/0.1ML Liqd nasal spray kit Commonly known as: NARCAN Place 1 spray into the nose daily as needed (opioid reversal).   promethazine 25 MG tablet Commonly known as: PHENERGAN Take 1 tablet (25 mg total) by mouth every 6 (six) hours as needed for nausea or vomiting.   sucralfate 1 g tablet Commonly known as: CARAFATE Take 1 tablet (1 g total) by mouth 2 (two) times daily for 14 days.   VISINE-A OP Place 2 drops into both eyes daily as needed (itching, irritation).       Allergies  Allergen Reactions   Norvasc [Amlodipine Besylate] Other (See Comments)    Fluid buildup in chest   Voltaren [Diclofenac] Hives and Other (See Comments)    Fluid buildup in chest   Prilosec [Omeprazole] Other (See Comments)    MD stopped due to pancreatitis   Hydrochlorothiazide Itching   Ultram [Tramadol] Other (See Comments)    Stomach upset   Prednisone Hives and Other (See Comments)    Mood swings    Follow-up Information     [facility] Schedule an appointment as soon as possible for a visit in 2 week(s).   Contact information: [address] [place] [address] [phone]         Benedetto Goad, MD. Schedule an appointment as soon as possible for a visit in 2 week(s).   Specialty: Gastroenterology Why: Follow-up in 1 to 2 weeks. Contact information: [address] [facility] MEDICINE [facility] [address] [place] [address] [phone]                  The results of significant  diagnostics from this hospitalization (including imaging, microbiology, ancillary and laboratory) are listed below for reference.    Significant Diagnostic Studies: CT ABDOMEN PELVIS W CONTRAST  Result Date: 10/14/2022 CLINICAL DATA:  Follow-up acute pancreatitis. EXAM: CT ABDOMEN AND PELVIS WITH CONTRAST TECHNIQUE: Multidetector CT imaging of the abdomen and pelvis was performed using the standard protocol following bolus administration of intravenous contrast. RADIATION DOSE REDUCTION: This exam was performed according to the departmental dose-optimization program which includes automated exposure control, adjustment of the mA and/or kV according to patient size and/or use of iterative reconstruction technique. CONTRAST:  75mL OMNIPAQUE IOHEXOL 350 MG/ML SOLN COMPARISON:  09/24/2022 FINDINGS: Lower Chest: No acute  findings. Hepatobiliary:  No suspicious hepatic masses identified. Pancreas: Mild acute pancreatitis involving the pancreatic head and body shows mild improvement since previous study. No evidence of pancreatic necrosis, mass or ductal dilatation. No peripancreatic fluid collections are seen. Spleen: Within normal limits in size and appearance. Adrenals/Urinary Tract: Normal adrenal glands. Severe atrophy of the native right kidney is seen. No suspicious masses identified. 9 mm calculus is noted in lower pole of left kidney. No evidence of ureteral calculi or hydronephrosis. Stomach/Bowel: No evidence of obstruction, inflammatory process or abnormal fluid collections. Normal appendix visualized. Vascular/Lymphatic: No pathologically enlarged lymph nodes. No acute vascular findings. Reproductive:  No mass or other significant abnormality. Other:  None. Musculoskeletal:  No suspicious bone lesions identified. IMPRESSION: Mild improvement in acute pancreatitis. No evidence of pancreatic necrosis, pseudocyst, or other complication. 9 mm left renal calculus. No evidence of ureteral calculi or  hydronephrosis. Electronically Signed   By: Danae Orleans M.D.   On: 10/14/2022 14:04   CT ABDOMEN PELVIS W CONTRAST  Result Date: 09/24/2022 CLINICAL DATA:  Pancreatitis, acute, severe. EXAM: CT ABDOMEN AND PELVIS WITH CONTRAST TECHNIQUE: Multidetector CT imaging of the abdomen and pelvis was performed using the standard protocol following bolus administration of intravenous contrast. RADIATION DOSE REDUCTION: This exam was performed according to the departmental dose-optimization program which includes automated exposure control, adjustment of the mA and/or kV according to patient size and/or use of iterative reconstruction technique. CONTRAST:  75mL OMNIPAQUE IOHEXOL 350 MG/ML SOLN COMPARISON:  CT abdomen/pelvis 05/04/2022. FINDINGS: Lower chest: No acute abnormality. Hepatobiliary: Hepatic steatosis. Prior cholecystectomy. No biliary dilatation. Pancreas: Edema and peripancreatic fat stranding with mild ductal dilatation predominantly involving the pancreatic head and uncinate process, consistent with acute pancreatitis. No peripancreatic fluid collection. Spleen: Normal. Adrenals/Urinary Tract: Adrenal glands are unremarkable. Unchanged 6 mm calyceal calculus in the lower pole left kidney. No mass or hydronephrosis. Absent right kidney. Bladder is unremarkable. Stomach/Bowel: Normal stomach. Inflammation along the second and third portions of the duodenum. No dilated loops of small bowel. Normal appendix is visualized on coronal image 76 series 5. Colon is decompressed. No wall thickening or surrounding inflammation. Vascular/Lymphatic: No significant vascular findings are present. No enlarged abdominal or pelvic lymph nodes. Reproductive: Prostate is unremarkable. Other: No abdominal wall hernia or abnormality. No abdominopelvic ascites. Musculoskeletal: No acute bony abnormality. Ankylosis of the right SI joint. Severe disc height loss at L5-S1. IMPRESSION: 1. Findings consistent with acute pancreatitis.  No peripancreatic fluid collection. 2. Hepatic steatosis. 3. Nonobstructing left renal calculus. Electronically Signed   By: Orvan Falconer M.D.   On: 09/24/2022 11:20   DG Chest Portable 1 View  Result Date: 09/24/2022 CLINICAL DATA:  cp EXAM: PORTABLE CHEST 1 VIEW COMPARISON:  Chest radiograph 12/23/2020 FINDINGS: The heart size and mediastinal contours are within normal limits. Both lungs are clear. The visualized skeletal structures are unremarkable. IMPRESSION: No active disease. Electronically Signed   By: Lorenza Cambridge M.D.   On: 09/24/2022 10:35    Microbiology: No results found for this or any previous visit (from the past 240 hour(s)).   Labs: Basic Metabolic Panel: Recent Labs  Lab 10/17/22 1117 10/18/22 0410 10/19/22 0157 10/20/22 0059 10/21/22 0109 10/22/22 0848 10/23/22 0347  NA 133* 138 135 135 133* 138 138  K 3.7 3.8 4.0 3.7 3.8 4.0 3.9  CL 99 102 103 101 103 103 106  CO2 26 28 22 27 22  21* 23  GLUCOSE 138* 112* 138* 136* 164* 182* 131*  BUN <5* 5*  8 8 12 13 15   CREATININE 1.08 1.02 1.25* 1.09 1.25* 0.95 0.96  CALCIUM 8.9 8.7* 8.7* 8.9 8.5* 9.1 9.0  MG 1.5* 2.0 1.7 1.8 1.9  --   --    Liver Function Tests: Recent Labs  Lab 10/18/22 0410 10/19/22 0157 10/20/22 0059 10/21/22 0109 10/22/22 0848  AST 19 21 20 21 27   ALT 16 14 16 17 19   ALKPHOS 47 61 59 49 44  BILITOT 0.7 0.6 0.5 0.3 0.6  PROT 6.1* 5.8* 6.4* 5.9* 5.6*  ALBUMIN 3.1* 2.9* 3.2* 3.0* 3.0*   Recent Labs  Lab 10/17/22 1117  LIPASE 49   No results for input(s): "AMMONIA" in the last 168 hours. CBC: Recent Labs  Lab 10/17/22 1117 10/18/22 0410 10/19/22 0157 10/20/22 0059 10/21/22 0109 10/22/22 0848  WBC 4.3 4.0 3.7* 4.5 6.6 6.0  NEUTROABS 2.0  --   --   --   --   --   HGB 12.0* 11.4* 10.6* 11.7* 11.5* 11.2*  HCT 36.2* 33.9* 32.1* 35.4* 35.4* 34.1*  MCV 102.3* 101.5* 102.6* 104.4* 104.1* 105.2*  PLT 144* 141* 139* 148* 141* 159   Cardiac Enzymes: No results for input(s):  "CKTOTAL", "CKMB", "CKMBINDEX", "TROPONINI" in the last 168 hours. BNP: BNP (last 3 results) No results for input(s): "BNP" in the last 8760 hours.  ProBNP (last 3 results) No results for input(s): "PROBNP" in the last 8760 hours.  CBG: No results for input(s): "GLUCAP" in the last 168 hours.     Signed:  Ramiro Harvest MD.  [facility] 10/23/2022, 1:11 PM

## 2022-10-23 NOTE — Progress Notes (Signed)
DISCHARGE NOTE HOME BERDELL HOSTETLER to be discharged Home per MD order. Diagnosis, treatment, medications and follow up appointments discussed with the patient. Medication list explained in detail. Patient verbalized knowledge and an understanding.  Skin clean, dry and intact without evidence of skin break down, no evidence of skin tears noted. IV catheter discontinued intact. Site without signs and symptoms of complications. Dressing and pressure applied. Pt denies pain at the site currently. No complaints noted.  Patient free of lines, drains, and wounds.   An After Visit Summary (AVS) was printed and given to the patient. Patient escorted via wheelchair, and discharged home via private auto.  Tresa Endo, RN

## 2023-01-12 ENCOUNTER — Other Ambulatory Visit: Payer: Self-pay

## 2023-01-12 ENCOUNTER — Encounter (HOSPITAL_COMMUNITY): Payer: Self-pay

## 2023-01-12 ENCOUNTER — Inpatient Hospital Stay (HOSPITAL_COMMUNITY)
Admission: EM | Admit: 2023-01-12 | Discharge: 2023-01-15 | DRG: 439 | Discharge status: Against Medical Advice | Payer: Managed Care, Other (non HMO) | Attending: Family Medicine | Admitting: Family Medicine

## 2023-01-12 DIAGNOSIS — Q600 Renal agenesis, unilateral: Secondary | ICD-10-CM

## 2023-01-12 DIAGNOSIS — Z841 Family history of disorders of kidney and ureter: Secondary | ICD-10-CM

## 2023-01-12 DIAGNOSIS — E7800 Pure hypercholesterolemia, unspecified: Secondary | ICD-10-CM | POA: Diagnosis present

## 2023-01-12 DIAGNOSIS — Z8249 Family history of ischemic heart disease and other diseases of the circulatory system: Secondary | ICD-10-CM

## 2023-01-12 DIAGNOSIS — K219 Gastro-esophageal reflux disease without esophagitis: Secondary | ICD-10-CM | POA: Diagnosis present

## 2023-01-12 DIAGNOSIS — Z5329 Procedure and treatment not carried out because of patient's decision for other reasons: Secondary | ICD-10-CM | POA: Diagnosis present

## 2023-01-12 DIAGNOSIS — K859 Acute pancreatitis without necrosis or infection, unspecified: Secondary | ICD-10-CM | POA: Diagnosis not present

## 2023-01-12 DIAGNOSIS — N179 Acute kidney failure, unspecified: Secondary | ICD-10-CM | POA: Diagnosis present

## 2023-01-12 DIAGNOSIS — I10 Essential (primary) hypertension: Secondary | ICD-10-CM | POA: Diagnosis present

## 2023-01-12 DIAGNOSIS — K861 Other chronic pancreatitis: Secondary | ICD-10-CM | POA: Diagnosis present

## 2023-01-12 DIAGNOSIS — M545 Low back pain, unspecified: Secondary | ICD-10-CM | POA: Diagnosis present

## 2023-01-12 DIAGNOSIS — Z888 Allergy status to other drugs, medicaments and biological substances status: Secondary | ICD-10-CM

## 2023-01-12 DIAGNOSIS — K8689 Other specified diseases of pancreas: Secondary | ICD-10-CM | POA: Diagnosis present

## 2023-01-12 DIAGNOSIS — K3184 Gastroparesis: Secondary | ICD-10-CM | POA: Diagnosis present

## 2023-01-12 DIAGNOSIS — N261 Atrophy of kidney (terminal): Secondary | ICD-10-CM | POA: Diagnosis present

## 2023-01-12 DIAGNOSIS — E876 Hypokalemia: Secondary | ICD-10-CM | POA: Diagnosis not present

## 2023-01-12 DIAGNOSIS — G8929 Other chronic pain: Secondary | ICD-10-CM | POA: Diagnosis present

## 2023-01-12 DIAGNOSIS — Z833 Family history of diabetes mellitus: Secondary | ICD-10-CM

## 2023-01-12 DIAGNOSIS — Z885 Allergy status to narcotic agent status: Secondary | ICD-10-CM

## 2023-01-12 DIAGNOSIS — Z79899 Other long term (current) drug therapy: Secondary | ICD-10-CM

## 2023-01-12 LAB — CBC
HCT: 42.1 % (ref 39.0–52.0)
Hemoglobin: 14.3 g/dL (ref 13.0–17.0)
MCH: 33.9 pg (ref 26.0–34.0)
MCHC: 34 g/dL (ref 30.0–36.0)
MCV: 99.8 fL (ref 80.0–100.0)
Platelets: 175 10*3/uL (ref 150–400)
RBC: 4.22 MIL/uL (ref 4.22–5.81)
RDW: 16.3 % — ABNORMAL HIGH (ref 11.5–15.5)
WBC: 7.9 10*3/uL (ref 4.0–10.5)
nRBC: 0 % (ref 0.0–0.2)

## 2023-01-12 LAB — URINALYSIS, ROUTINE W REFLEX MICROSCOPIC
Bacteria, UA: NONE SEEN
Bilirubin Urine: NEGATIVE
Glucose, UA: NEGATIVE mg/dL
Ketones, ur: 5 mg/dL — AB
Nitrite: NEGATIVE
Protein, ur: 30 mg/dL — AB
Specific Gravity, Urine: 1.018 (ref 1.005–1.030)
pH: 5 (ref 5.0–8.0)

## 2023-01-12 LAB — COMPREHENSIVE METABOLIC PANEL
ALT: 56 U/L — ABNORMAL HIGH (ref 0–44)
AST: 48 U/L — ABNORMAL HIGH (ref 15–41)
Albumin: 3 g/dL — ABNORMAL LOW (ref 3.5–5.0)
Alkaline Phosphatase: 61 U/L (ref 38–126)
Anion gap: 15 (ref 5–15)
BUN: 15 mg/dL (ref 6–20)
CO2: 19 mmol/L — ABNORMAL LOW (ref 22–32)
Calcium: 8.8 mg/dL — ABNORMAL LOW (ref 8.9–10.3)
Chloride: 98 mmol/L (ref 98–111)
Creatinine, Ser: 1.91 mg/dL — ABNORMAL HIGH (ref 0.61–1.24)
GFR, Estimated: 41 mL/min — ABNORMAL LOW (ref >60)
Glucose, Bld: 126 mg/dL — ABNORMAL HIGH (ref 70–99)
Potassium: 3.8 mmol/L (ref 3.5–5.1)
Sodium: 132 mmol/L — ABNORMAL LOW (ref 135–145)
Total Bilirubin: 1.6 mg/dL — ABNORMAL HIGH (ref 0.3–1.2)
Total Protein: 7 g/dL (ref 6.5–8.1)

## 2023-01-12 LAB — LIPASE, BLOOD: Lipase: 61 U/L — ABNORMAL HIGH (ref 11–51)

## 2023-01-12 MED ORDER — ENOXAPARIN SODIUM 40 MG/0.4ML IJ SOSY
40.0000 mg | PREFILLED_SYRINGE | INTRAMUSCULAR | Status: DC
  Administered 2023-01-12: 40 mg via SUBCUTANEOUS
  Filled 2023-01-12: qty 0.4

## 2023-01-12 MED ORDER — HYDROMORPHONE HCL 1 MG/ML IJ SOLN
0.5000 mg | Freq: Once | INTRAMUSCULAR | Status: AC
  Administered 2023-01-12: 0.5 mg via INTRAVENOUS
  Filled 2023-01-12: qty 1

## 2023-01-12 MED ORDER — FAMOTIDINE 20 MG PO TABS
20.0000 mg | ORAL_TABLET | Freq: Every day | ORAL | Status: DC
  Administered 2023-01-13 – 2023-01-14 (×2): 20 mg via ORAL
  Filled 2023-01-12 (×3): qty 1

## 2023-01-12 MED ORDER — CARVEDILOL 3.125 MG PO TABS
3.1250 mg | ORAL_TABLET | Freq: Two times a day (BID) | ORAL | Status: DC
  Administered 2023-01-12 – 2023-01-14 (×5): 3.125 mg via ORAL
  Filled 2023-01-12 (×6): qty 1

## 2023-01-12 MED ORDER — NALOXONE HCL 4 MG/0.1ML NA LIQD: 1.0000 | Freq: Every day | NASAL | Status: DC | PRN

## 2023-01-12 MED ORDER — SODIUM CHLORIDE 0.9 % IV SOLN: INTRAVENOUS | Status: AC

## 2023-01-12 MED ORDER — LACTATED RINGERS IV BOLUS
2000.0000 mL | Freq: Once | INTRAVENOUS | Status: AC
  Administered 2023-01-12: 2000 mL via INTRAVENOUS

## 2023-01-12 MED ORDER — HYDROMORPHONE HCL 1 MG/ML IJ SOLN
1.0000 mg | Freq: Once | INTRAMUSCULAR | Status: AC
  Administered 2023-01-12: 1 mg via INTRAVENOUS
  Filled 2023-01-12: qty 1

## 2023-01-12 MED ORDER — HYDROMORPHONE HCL 1 MG/ML IJ SOLN
0.5000 mg | INTRAMUSCULAR | Status: DC | PRN
  Administered 2023-01-12 – 2023-01-15 (×18): 1 mg via INTRAVENOUS
  Filled 2023-01-12 (×18): qty 1

## 2023-01-12 MED ORDER — ACETAMINOPHEN 325 MG PO TABS: 650.0000 mg | ORAL_TABLET | Freq: Four times a day (QID) | ORAL | Status: DC | PRN

## 2023-01-12 MED ORDER — PANCRELIPASE (LIP-PROT-AMYL) 36000-114000 UNITS PO CPEP
72000.0000 [IU] | ORAL_CAPSULE | Freq: Three times a day (TID) | ORAL | Status: DC
  Administered 2023-01-12 – 2023-01-14 (×7): 72000 [IU] via ORAL
  Filled 2023-01-12 (×9): qty 2

## 2023-01-12 MED ORDER — POLYETHYLENE GLYCOL 3350 17 G PO PACK: 17.0000 g | PACK | Freq: Every day | ORAL | Status: DC | PRN

## 2023-01-12 MED ORDER — PANCRELIPASE (LIP-PROT-AMYL) 36000-114000 UNITS PO CPEP: 36000.0000 [IU] | ORAL_CAPSULE | Freq: Three times a day (TID) | ORAL | Status: DC | PRN

## 2023-01-12 MED ORDER — INFLUENZA VIRUS VACC SPLIT PF (FLUZONE) 0.5 ML IM SUSY: 0.5000 mL | PREFILLED_SYRINGE | INTRAMUSCULAR | Status: DC

## 2023-01-12 MED ORDER — SODIUM CHLORIDE 0.9% FLUSH
3.0000 mL | Freq: Two times a day (BID) | INTRAVENOUS | Status: DC
  Administered 2023-01-12 – 2023-01-14 (×2): 3 mL via INTRAVENOUS

## 2023-01-12 MED ORDER — METOCLOPRAMIDE HCL 5 MG/ML IJ SOLN
10.0000 mg | Freq: Once | INTRAMUSCULAR | Status: AC
  Administered 2023-01-12: 10 mg via INTRAVENOUS
  Filled 2023-01-12: qty 2

## 2023-01-12 MED ORDER — ACETAMINOPHEN 650 MG RE SUPP: 650.0000 mg | Freq: Four times a day (QID) | RECTAL | Status: DC | PRN

## 2023-01-12 MED ORDER — DIPHENHYDRAMINE HCL 25 MG PO CAPS
25.0000 mg | ORAL_CAPSULE | Freq: Once | ORAL | Status: AC
  Administered 2023-01-12: 25 mg via ORAL
  Filled 2023-01-12: qty 1

## 2023-01-12 NOTE — ED Provider Notes (Signed)
[place] EMERGENCY DEPARTMENT AT [facility] Provider Note   CSN: [id number] Arrival date & time: 01/12/23  1111     History  Chief Complaint  Patient presents with   Abdominal Pain    Kyle Berry is a 52 y.o. male.   Abdominal Pain Patient is a 52 year old male with past medical history significant for chronic pancreatitis presented today with left upper quadrant abdominal pain nausea and multiple episodes of nonbloody nonbilious emesis for the past 3 days.  He states that his pains been constant sharp stabbing and severe.  He states he has been unable to tolerate p.o. over the past 36 hours and states that he has not had any urine output over approximately that period of time.  He denies any chest pain or difficulty breathing.  No syncope or near syncope no cough or fever.  He attempted to ameliorate his symptoms with hydrocodone and tramadol with minimal improvement.  He took Phenergan at home however was unable to keep it down.  He states that Zofran ODT has not worked for him in the past.     Home Medications Prior to Admission medications   Medication Sig Start Date End Date Taking? Authorizing Provider  acetaminophen (TYLENOL) 325 MG tablet Take 650 mg by mouth daily as needed for moderate pain, headache or fever.    [provider]  albuterol (VENTOLIN HFA) 108 (90 Base) MCG/ACT inhaler Inhale 2 puffs into the lungs every 6 (six) hours as needed for wheezing or shortness of breath.    [provider]  carvedilol (COREG) 3.125 MG tablet Take 1 tablet (3.125 mg total) by mouth 2 (two) times daily with a meal. 10/23/22   Rodolph Bong, MD  famotidine (PEPCID) 20 MG tablet Take 1 tablet (20 mg total) by mouth 2 (two) times daily for 30 days, THEN 1 tablet (20 mg total) daily. 10/23/22 11/22/23  Rodolph Bong, MD  folic acid (FOLVITE) 1 MG tablet Take 1 tablet (1 mg total) by mouth daily. 09/29/22   Burnadette Pop, MD  gabapentin  (NEURONTIN) 100 MG capsule Take 1 capsule (100 mg total) by mouth 3 (three) times daily. 10/27/21   Rolly Salter, MD  HYDROcodone-acetaminophen (NORCO/VICODIN) 5-325 MG tablet Take 1-2 tablets by mouth every 4 (four) hours as needed for moderate pain. 10/23/22   Rodolph Bong, MD  lipase/protease/amylase (CREON) [phone] UNITS CPEP capsule Take 2 capsules (72,000 Units total) by mouth with breakfast, with lunch, and with evening meal. 10/23/22   Rodolph Bong, MD  lipase/protease/amylase (CREON) 36000 UNITS CPEP capsule Take 1 capsule (36,000 Units total) by mouth 3 (three) times daily between meals as needed (with snacks). 10/23/22   Rodolph Bong, MD  naloxone [facility]) nasal spray 4 mg/0.1 mL Place 1 spray into the nose daily as needed (opioid reversal). 07/16/22   [provider]  Naphazoline-Pheniramine (VISINE-A OP) Place 2 drops into both eyes daily as needed (itching, irritation).    [provider]  promethazine (PHENERGAN) 25 MG tablet Take 1 tablet (25 mg total) by mouth every 6 (six) hours as needed for nausea or vomiting. 10/23/22   Rodolph Bong, MD  sucralfate (CARAFATE) 1 g tablet Take 1 tablet (1 g total) by mouth 2 (two) times daily for 14 days. 10/23/22 11/06/22  Rodolph Bong, MD      Allergies    Norvasc [amlodipine besylate], Voltaren [diclofenac], Prilosec [omeprazole], Hydrochlorothiazide, Ultram [tramadol], and Prednisone    Review of  Systems   Review of Systems  Gastrointestinal:  Positive for abdominal pain.    Physical Exam Updated Vital Signs BP 110/85 (BP Location: Left Arm)   Pulse (!) 101   Temp 98.3 F (36.8 C)   Resp 18   Ht 6\' 1"  (1.854 m)   Wt 74.8 kg   SpO2 98%   BMI 21.77 kg/m  Physical Exam Vitals and nursing note reviewed.  Constitutional:      General: He is not in acute distress. HENT:     Head: Normocephalic and atraumatic.     Nose: Nose normal.     Mouth/Throat:     Mouth: Mucous membranes are dry.  Eyes:      General: No scleral icterus. Cardiovascular:     Rate and Rhythm: Normal rate and regular rhythm.     Pulses: Normal pulses.     Heart sounds: Normal heart sounds.  Pulmonary:     Effort: Pulmonary effort is normal. No respiratory distress.     Breath sounds: No wheezing.  Abdominal:     Palpations: Abdomen is soft.     Tenderness: There is abdominal tenderness in the epigastric area.  Musculoskeletal:     Cervical back: Normal range of motion.     Right lower leg: No edema.     Left lower leg: No edema.  Skin:    General: Skin is warm and dry.     Capillary Refill: Capillary refill takes less than 2 seconds.  Neurological:     Mental Status: He is alert. Mental status is at baseline.  Psychiatric:        Mood and Affect: Mood normal.        Behavior: Behavior normal.     ED Results / Procedures / Treatments   Labs (all labs ordered are listed, but only abnormal results are displayed) Labs Reviewed  LIPASE, BLOOD - Abnormal; Notable for the following components:      Result Value   Lipase 61 (*)    All other components within normal limits  COMPREHENSIVE METABOLIC PANEL - Abnormal; Notable for the following components:   Sodium 132 (*)    CO2 19 (*)    Glucose, Bld 126 (*)    Creatinine, Ser 1.91 (*)    Calcium 8.8 (*)    Albumin 3.0 (*)    AST 48 (*)    ALT 56 (*)    Total Bilirubin 1.6 (*)    GFR, Estimated 41 (*)    All other components within normal limits  CBC - Abnormal; Notable for the following components:   RDW 16.3 (*)    All other components within normal limits  URINALYSIS, ROUTINE W REFLEX MICROSCOPIC - Abnormal; Notable for the following components:   Hgb urine dipstick SMALL (*)    Ketones, ur 5 (*)    Protein, ur 30 (*)    Leukocytes,Ua TRACE (*)    All other components within normal limits    EKG None  Radiology No results found.  Procedures Procedures    Medications Ordered in ED Medications  lactated ringers bolus 2,000 mL  (2,000 mLs Intravenous New Bag/Given 01/12/23 1255)  HYDROmorphone (DILAUDID) injection 0.5 mg (0.5 mg Intravenous Given 01/12/23 1317)  metoCLOPramide (REGLAN) injection 10 mg (10 mg Intravenous Given 01/12/23 1316)  diphenhydrAMINE (BENADRYL) capsule 25 mg (25 mg Oral Given 01/12/23 1317)  HYDROmorphone (DILAUDID) injection 1 mg (1 mg Intravenous Given 01/12/23 1515)    ED Course/ Medical Decision Making/ A&P  Medical Decision Making Amount and/or Complexity of Data Reviewed Labs: ordered.  Risk Prescription drug management.   This patient presents to the ED for concern of abd pain, this involves a number of treatment options, and is a complaint that carries with it a moderate to high risk of complications and morbidity. A differential diagnosis was considered for the patient's symptoms which is discussed below:   The causes of generalized abdominal pain include but are not limited to AAA, mesenteric ischemia, appendicitis, diverticulitis, DKA, gastritis, gastroenteritis, AMI, nephrolithiasis, pancreatitis, peritonitis, adrenal insufficiency,lead poisoning, iron toxicity, intestinal ischemia, constipation, UTI,SBO/LBO, splenic rupture, biliary disease, IBD, IBS, PUD, or hepatitis.   Co morbidities: Discussed in HPI   Brief History:  Patient is a 52 year old male with past medical history significant for chronic pancreatitis presented today with left upper quadrant abdominal pain nausea and multiple episodes of nonbloody nonbilious emesis for the past 3 days.  He states that his pains been constant sharp stabbing and severe.  He states he has been unable to tolerate p.o. over the past 36 hours and states that he has not had any urine output over approximately that period of time.  He denies any chest pain or difficulty breathing.  No syncope or near syncope no cough or fever.  He attempted to ameliorate his symptoms with hydrocodone and tramadol with  minimal improvement.  He took Phenergan at home however was unable to keep it down.  He states that Zofran ODT has not worked for him in the past.    EMR reviewed including pt PMHx, past surgical history and past visits to ER.   See HPI for more details   Lab Tests:  I ordered and independently interpreted labs. Labs notable for AKI: creat 1.91 -- baseline of ~1 Mild hyponatremia -- likely dehydration CBC NML  Imaging Studies:  No imaging studies ordered for this patient    Cardiac Monitoring:  NA NA   Medicines ordered:  I ordered medication including LR 2L, dilaudid for pain, benadryl, reglan  for NV and abd pain Reevaluation of the patient after these medicines showed that the patient improved I have reviewed the patients home medicines and have made adjustments as needed   Critical Interventions:     Consults/Attending Physician   I discussed this case with my attending physician who cosigned this note including patient's presenting symptoms, physical exam, and planned diagnostics and interventions. Attending physician stated agreement with plan or made changes to plan which were implemented.    Reevaluation:  After the interventions noted above I re-evaluated patient and found that they have :worsened   Social Determinants of Health:  The patient's social determinants of health were a factor in the care of this patient    Problem List / ED Course:  Pancreatitis -patient with classic epigastric abdominal pain has elevated lipase and is in extreme discomfort.  Lipase is not significantly elevated however this is in the setting of chronic pancreatitis. AKI likely secondary to vomiting from above issue.   Dispostion:  After consideration of the diagnostic results and the patients response to treatment, I feel that the patent would benefit from admission for pain control due to pancreatitis as well as AKI  Final Clinical Impression(s) / ED  Diagnoses Final diagnoses:  Acute pancreatitis, unspecified complication status, unspecified pancreatitis type  AKI (acute kidney injury) [facility])    Rx / DC Orders ED Discharge Orders     None         Gailen Shelter,  PA 01/12/23 1517    Jacalyn Lefevre, MD 01/13/23 1558

## 2023-01-12 NOTE — H&P (Signed)
History and Physical   Kyle Berry ION:[id number] DOB: [birth year] DOA: 01/12/2023  PCP: [facility]   Patient coming from: Home  Chief Complaint: Abdominal Pain  HPI: Kyle Berry is a 52 y.o. male with medical history significant of chronic pancreatitis, alcohol use, pancreatic insufficiency, Solitary kidney, HTN, HLD presenting with abdominal pain.  Patient presenting with recurrent abdominal pain.  Patient has known history of chronic pancreatitis with multiple recurrent flares.  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  Pain today is similar to previous.  He reports several days of nausea vomiting with sharp/stabbing abdominal pain.  Has had decreased p.o. intake during that time and reports trying hydrocodone and tramadol and Zofran at home without improvement.  Denies fevers, chills, chest pain, shortness of breath, constipation, diarrhea. Vital signs in the ED notable for tachycardia in the 100s.  Lab workup included CMP with sodium 132, bicarb 19,  ED Course: Creatinine 1.9 up from baseline 1, glucose 126, calcium 8.8, albumin 3.0, AST 40, ALT 56, T. bili 1.6.  CBC within normal limits.  Lipase 61.  Urinalysis with hemoglobin, ketones and protein, trace leukocytes.  No imaging in the ED.  Patient received Dilaudid, Reglan, Benadryl, 2 L IV fluids.  Review of Systems: As per HPI otherwise all other systems reviewed and are negative.  Past Medical History:  Diagnosis Date   Atrophy of right kidney    Chronic bronchitis (HCC)    Chronic lower back pain    Chronic pancreatitis (HCC)    Gastroparesis 06/2020   confirmed by emptying study at [facility]   GERD (gastroesophageal reflux disease)    Headache    "once/month" (05/13/2017)   Hematemesis 10/18/2017   Hematochezia 09/19/2021   High cholesterol    Hypertension    Migraine    "a couple/year" (05/13/2017)   Nephrolithiasis 05/13/2017   Pancreatitis    Pneumonia ~ 09/2015   Presence  of pancreatic duct stent    Recurrent acute pancreatitis    Shortness of breath 05/22/2021    Past Surgical History:  Procedure Laterality Date   BIOPSY  11/02/2020   Procedure: BIOPSY;  Surgeon: [name]., MD;  Location: [facility] ENDOSCOPY;  Service: Gastroenterology;;   BIOPSY  10/20/2022   Procedure: BIOPSY;  Surgeon: [name]., MD;  Location: [facility] ENDOSCOPY;  Service: Gastroenterology;;   CHOLECYSTECTOMY N/A 10/23/2017   Procedure: LAPAROSCOPIC CHOLECYSTECTOMY WITH INTRAOPERATIVE CHOLANGIOGRAM;  Surgeon: Luretha Murphy, MD;  Location: [facility] ORS;  Service: General;  Laterality: N/A;   COLONOSCOPY WITH PROPOFOL N/A 11/02/2019   Procedure: COLONOSCOPY WITH PROPOFOL;  Surgeon: Hilarie Fredrickson, MD;  Location: [facility] ENDOSCOPY;  Service: Endoscopy;  Laterality: N/A;   ERCP N/A 11/02/2020   Procedure: ENDOSCOPIC RETROGRADE CHOLANGIOPANCREATOGRAPHY (ERCP);  Surgeon: [name]., MD;  Location: [facility] ENDOSCOPY;  Service: Gastroenterology;  Laterality: N/A;   ESOPHAGOGASTRODUODENOSCOPY N/A 11/02/2020   Procedure: ESOPHAGOGASTRODUODENOSCOPY (EGD);  Surgeon: [name]., MD;  Location: [facility] ENDOSCOPY;  Service: Gastroenterology;  Laterality: N/A;   ESOPHAGOGASTRODUODENOSCOPY (EGD) WITH PROPOFOL N/A 11/06/2019   Procedure: ESOPHAGOGASTRODUODENOSCOPY (EGD) WITH PROPOFOL;  Surgeon: Hilarie Fredrickson, MD;  Location: [facility] ENDOSCOPY;  Service: Endoscopy;  Laterality: N/A;   ESOPHAGOGASTRODUODENOSCOPY (EGD) WITH PROPOFOL N/A 10/20/2022   Procedure: ESOPHAGOGASTRODUODENOSCOPY (EGD) WITH PROPOFOL;  Surgeon: Meridee Score Netty Starring., MD;  Location: [facility] ENDOSCOPY;  Service: Gastroenterology;  Laterality: N/A;   HEMOSTASIS CLIP PLACEMENT  11/02/2019   Procedure: HEMOSTASIS CLIP PLACEMENT;  Surgeon: Hilarie Fredrickson, MD;  Location: [facility] ENDOSCOPY;  Service: Endoscopy;;   NEUROLYTIC CELIAC PLEXUS  10/20/2022   Procedure: NEUROLYTIC CELIAC PLEXUS;  Surgeon: Meridee Score Netty Starring., MD;  Location: [facility]  ENDOSCOPY;  Service: Gastroenterology;;   NO PAST SURGERIES     REMOVAL OF STONES  11/02/2020   Procedure: REMOVAL OF STONES;  Surgeon: [name]., MD;  Location: [facility] ENDOSCOPY;  Service: Gastroenterology;;   Francine Graven REMOVAL  11/02/2020   Procedure: STENT REMOVAL;  Surgeon: [name]., MD;  Location: [facility] ENDOSCOPY;  Service: Gastroenterology;;   UPPER ESOPHAGEAL ENDOSCOPIC ULTRASOUND (EUS) N/A 10/20/2022   Procedure: UPPER ESOPHAGEAL ENDOSCOPIC ULTRASOUND (EUS);  Surgeon: [name]., MD;  Location: [facility] ENDOSCOPY;  Service: Gastroenterology;  Laterality: N/A;    Social History  reports that he has never smoked. He has never used smokeless tobacco. He reports that he does not currently use alcohol. He reports that he does not use drugs.  Allergies  Allergen Reactions   Norvasc [Amlodipine Besylate] Other (See Comments)    Fluid buildup in chest   Voltaren [Diclofenac] Hives and Other (See Comments)    Fluid buildup in chest   Prilosec [Omeprazole] Other (See Comments)    MD stopped due to pancreatitis   Hydrochlorothiazide Itching   Ultram [Tramadol] Other (See Comments)    Stomach upset   Prednisone Hives and Other (See Comments)    Mood swings    Family History  Problem Relation Age of Onset   Hypertension Other    Hypertension Mother    Hypertension Father    Kidney disease Father    Hypertension Sister    Diabetes Sister    Hypertension Brother    Pancreatic cancer Paternal Grandmother    Colon cancer Cousin    Stomach cancer Neg Hx    Esophageal cancer Neg Hx   Reviewed on admission  Prior to Admission medications   Medication Sig Start Date End Date Taking? Authorizing Provider  acetaminophen (TYLENOL) 325 MG tablet Take 650 mg by mouth daily as needed for moderate pain, headache or fever.    [provider]  albuterol (VENTOLIN HFA) 108 (90 Base) MCG/ACT inhaler Inhale 2 puffs into the lungs every 6 (six) hours as needed for  wheezing or shortness of breath.    [provider]  carvedilol (COREG) 3.125 MG tablet Take 1 tablet (3.125 mg total) by mouth 2 (two) times daily with a meal. 10/23/22   Rodolph Bong, MD  famotidine (PEPCID) 20 MG tablet Take 1 tablet (20 mg total) by mouth 2 (two) times daily for 30 days, THEN 1 tablet (20 mg total) daily. 10/23/22 11/22/23  Rodolph Bong, MD  folic acid (FOLVITE) 1 MG tablet Take 1 tablet (1 mg total) by mouth daily. 09/29/22   Burnadette Pop, MD  gabapentin (NEURONTIN) 100 MG capsule Take 1 capsule (100 mg total) by mouth 3 (three) times daily. 10/27/21   Rolly Salter, MD  HYDROcodone-acetaminophen (NORCO/VICODIN) 5-325 MG tablet Take 1-2 tablets by mouth every 4 (four) hours as needed for moderate pain. 10/23/22   Rodolph Bong, MD  lipase/protease/amylase (CREON) [phone] UNITS CPEP capsule Take 2 capsules (72,000 Units total) by mouth with breakfast, with lunch, and with evening meal. 10/23/22   Rodolph Bong, MD  lipase/protease/amylase (CREON) 36000 UNITS CPEP capsule Take 1 capsule (36,000 Units total) by mouth 3 (three) times daily between meals as needed (with snacks). 10/23/22   Rodolph Bong, MD  naloxone [facility]) nasal spray 4 mg/0.1 mL Place 1 spray into the  nose daily as needed (opioid reversal). 07/16/22   [provider]  Naphazoline-Pheniramine (VISINE-A OP) Place 2 drops into both eyes daily as needed (itching, irritation).    [provider]  promethazine (PHENERGAN) 25 MG tablet Take 1 tablet (25 mg total) by mouth every 6 (six) hours as needed for nausea or vomiting. 10/23/22   Rodolph Bong, MD  sucralfate (CARAFATE) 1 g tablet Take 1 tablet (1 g total) by mouth 2 (two) times daily for 14 days. 10/23/22 11/06/22  Rodolph Bong, MD    Physical Exam: Vitals:   01/12/23 1117 01/12/23 1128  BP: 110/85   Pulse: (!) 101   Resp: 18   Temp: 98.3 F (36.8 C)   SpO2: 98%   Weight:  74.8 kg  Height:  6\' 1"  (1.854 m)     Physical Exam Constitutional:      General: He is not in acute distress.    Appearance: Normal appearance.  HENT:     Head: Normocephalic and atraumatic.     Mouth/Throat:     Mouth: Mucous membranes are moist.     Pharynx: Oropharynx is clear.  Eyes:     Extraocular Movements: Extraocular movements intact.     Pupils: Pupils are equal, round, and reactive to light.  Cardiovascular:     Rate and Rhythm: Normal rate and regular rhythm.     Pulses: Normal pulses.     Heart sounds: Normal heart sounds.  Pulmonary:     Effort: Pulmonary effort is normal. No respiratory distress.     Breath sounds: Normal breath sounds.  Abdominal:     General: Bowel sounds are normal. There is no distension.     Palpations: Abdomen is soft.     Tenderness: There is abdominal tenderness.  Musculoskeletal:        General: No swelling or deformity.  Skin:    General: Skin is warm and dry.  Neurological:     General: No focal deficit present.     Mental Status: Mental status is at baseline.    Labs on Admission: I have personally reviewed following labs and imaging studies  CBC: Recent Labs  Lab 01/12/23 1130  WBC 7.9  HGB 14.3  HCT 42.1  MCV 99.8  PLT 175    Basic Metabolic Panel: Recent Labs  Lab 01/12/23 1130  NA 132*  K 3.8  CL 98  CO2 19*  GLUCOSE 126*  BUN 15  CREATININE 1.91*  CALCIUM 8.8*    GFR: Estimated Creatinine Clearance: 47.3 mL/min (A) (by C-G formula based on SCr of 1.91 mg/dL (H)).  Liver Function Tests: Recent Labs  Lab 01/12/23 1130  AST 48*  ALT 56*  ALKPHOS 61  BILITOT 1.6*  PROT 7.0  ALBUMIN 3.0*    Urine analysis:    Component Value Date/Time   COLORURINE YELLOW 01/12/2023 1453   APPEARANCEUR CLEAR 01/12/2023 1453   LABSPEC 1.018 01/12/2023 1453   PHURINE 5.0 01/12/2023 1453   GLUCOSEU NEGATIVE 01/12/2023 1453   HGBUR SMALL (A) 01/12/2023 1453   BILIRUBINUR NEGATIVE 01/12/2023 1453   KETONESUR 5 (A) 01/12/2023 1453   PROTEINUR  30 (A) 01/12/2023 1453   UROBILINOGEN 0.2 01/20/2015 1643   NITRITE NEGATIVE 01/12/2023 1453   LEUKOCYTESUR TRACE (A) 01/12/2023 1453    Radiological Exams on Admission: No results found.  EKG: Not performed in emergency department  Assessment/Plan Principal Problem:   Acute on chronic pancreatitis [facility]) Active Problems:   Essential hypertension   Pancreatic  insufficiency   High cholesterol   Acute on chronic pancreatitis Pancreatic insufficiency > Patient presenting with recurrent abdominal pain similar to prior episodes of pancreatitis.  Only borderline elevated lipase and imaging not checked but clinically consistent with pancreatitis. > No alcohol since last admission and tried staying off alcohol for 2 years in the past without improvement in frequency of symptoms. > Nausea vomiting and decreased p.o. intake with sharp/stabbing abdominal pain for about 3 days similar to previous episodes. > Also noted to have AKI as below.  Received 2 L IV fluids, Dilaudid, Reglan, Benadryl in the ED. - Monitor on telemetry overnight - Continue with IV fluids - Continue as needed Dilaudid - N.p.o. except sips and ice chips - Continue home Creon and Pepcid - Supportive care  AKI > Creatinine elevated to 1.9 from baseline of 1.  Received 2 L as above in the ED. - Continue with IV fluids - Trend renal function and electrolytes  Hypertension - Continue home carvedilol  Hyperlipidemia - Not currently on any medication for this  DVT prophylaxis: Lovenox Code Status:   Full Family Communication:  None on admission  Disposition Plan:   Patient is from:  Home  Anticipated DC to:  Home  Anticipated DC date:  1 to 3 days  Anticipated DC barriers: None  Consults called:  None Admission status:  Observation, telemetry  Severity of Illness: The appropriate patient status for this patient is OBSERVATION. Observation status is judged to be reasonable and necessary in order to provide the  required intensity of service to ensure the patient's safety. The patient's presenting symptoms, physical exam findings, and initial radiographic and laboratory data in the context of their medical condition is felt to place them at decreased risk for further clinical deterioration. Furthermore, it is anticipated that the patient will be medically stable for discharge from the hospital within 2 midnights of admission.    Synetta Fail MD [facility]  How to contact the [facility] Attending or Consulting provider 7A - 7P or covering provider during after hours 7P -7A, for this patient?   Check the care team in [facility] and look for a) attending/consulting [facility] provider listed and b) the [facility] team listed Log into [email or web address] and use [facility] universal password to access. If you do not have the password, please contact the hospital operator. Locate the [facility] provider you are looking for under [facility] and page to a number that you can be directly reached. If you still have difficulty reaching the provider, please page the [facility] (Director on Call) for the Hospitalists listed on amion for assistance.  01/12/2023, 3:35 PM

## 2023-01-12 NOTE — ED Notes (Signed)
ED TO INPATIENT HANDOFF REPORT  ED Nurse Name and Phone #:  Florentina Addison [phone]  S Name/Age/Gender Kyle Berry 52 y.o. male Room/Bed: H021C/H021C  Code Status   Code Status: Full Code  Home/SNF/Other Home Patient oriented to: self, place, time, and situation Is this baseline? Yes   Triage Complete: Triage complete  Chief Complaint AKI (acute kidney injury) (HCC) [N17.9]  Triage Note Pt BIB [facility] from home d/t LUQ abd pain with n/v the last 3 days. Does have Hx of pancreatitis & kidney problems (per pt) & no urine output the last 2 days. A/Ox4, 130/90, CBG 131, 92 bpm NSR, 98% on RA.      Allergies Allergies  Allergen Reactions   Norvasc [Amlodipine Besylate] Other (See Comments)    Fluid buildup in chest   Voltaren [Diclofenac] Hives and Other (See Comments)    Fluid buildup in chest   Prilosec [Omeprazole] Other (See Comments)    MD stopped due to pancreatitis   Hydrochlorothiazide Itching   Ultram [Tramadol] Other (See Comments)    Stomach upset   Prednisone Hives and Other (See Comments)    Mood swings    Level of Care/Admitting Diagnosis ED Disposition     ED Disposition  Admit   Condition  --   Comment  Hospital Area: [facility] [[id number]]  Level of Care: Telemetry Medical [104]  May place patient in observation at [facility] or [facility] if equivalent level of care is available:: No  Covid Evaluation: Asymptomatic - no recent exposure (last 10 days) testing not required  Diagnosis: AKI (acute kidney injury) [facility]) [[id number]]  Admitting Physician: Synetta Fail [[id number]]  Attending Physician: Synetta Fail [[id number]]          B Medical/Surgery History Past Medical History:  Diagnosis Date   Atrophy of right kidney    Chronic bronchitis (HCC)    Chronic lower back pain    Chronic pancreatitis (HCC)    Gastroparesis 06/2020   confirmed by emptying study at [facility]   GERD (gastroesophageal reflux disease)     Headache    "once/month" (05/13/2017)   Hematemesis 10/18/2017   Hematochezia 09/19/2021   High cholesterol    Hypertension    Migraine    "a couple/year" (05/13/2017)   Nephrolithiasis 05/13/2017   Pancreatitis    Pneumonia ~ 09/2015   Presence of pancreatic duct stent    Recurrent acute pancreatitis    Shortness of breath 05/22/2021   Past Surgical History:  Procedure Laterality Date   BIOPSY  11/02/2020   Procedure: BIOPSY;  Surgeon: [name]., MD;  Location: [facility] ENDOSCOPY;  Service: Gastroenterology;;   BIOPSY  10/20/2022   Procedure: BIOPSY;  Surgeon: [name]., MD;  Location: [facility] ENDOSCOPY;  Service: Gastroenterology;;   CHOLECYSTECTOMY N/A 10/23/2017   Procedure: LAPAROSCOPIC CHOLECYSTECTOMY WITH INTRAOPERATIVE CHOLANGIOGRAM;  Surgeon: Luretha Murphy, MD;  Location: [facility] ORS;  Service: General;  Laterality: N/A;   COLONOSCOPY WITH PROPOFOL N/A 11/02/2019   Procedure: COLONOSCOPY WITH PROPOFOL;  Surgeon: Hilarie Fredrickson, MD;  Location[facility] ENDOSCOPY;  Service: Endoscopy;  Laterality: N/A;   ERCP N/A 11/02/2020   Procedure: ENDOSCOPIC RETROGRADE CHOLANGIOPANCREATOGRAPHY (ERCP);  Surgeon: [name]., MD;  Location: [facility] ENDOSCOPY;  Service: Gastroenterology;  Laterality: N/A;   ESOPHAGOGASTRODUODENOSCOPY N/A 11/02/2020   Procedure: ESOPHAGOGASTRODUODENOSCOPY (EGD);  Surgeon: [name]., MD;  Location: [facility] ENDOSCOPY;  Service: Gastroenterology;  Laterality: N/A;   ESOPHAGOGASTRODUODENOSCOPY (EGD) WITH PROPOFOL N/A 11/06/2019   Procedure: ESOPHAGOGASTRODUODENOSCOPY (EGD)  WITH PROPOFOL;  Surgeon: Hilarie Fredrickson, MD;  Location: [facility] ENDOSCOPY;  Service: Endoscopy;  Laterality: N/A;   ESOPHAGOGASTRODUODENOSCOPY (EGD) WITH PROPOFOL N/A 10/20/2022   Procedure: ESOPHAGOGASTRODUODENOSCOPY (EGD) WITH PROPOFOL;  Surgeon: Meridee Score Netty Starring., MD;  Location: [facility] ENDOSCOPY;  Service: Gastroenterology;  Laterality: N/A;   HEMOSTASIS CLIP PLACEMENT  11/02/2019    Procedure: HEMOSTASIS CLIP PLACEMENT;  Surgeon: Hilarie Fredrickson, MD;  Location: [facility] ENDOSCOPY;  Service: Endoscopy;;   NEUROLYTIC CELIAC PLEXUS  10/20/2022   Procedure: NEUROLYTIC CELIAC PLEXUS;  Surgeon: [name]., MD;  Location: [facility] ENDOSCOPY;  Service: Gastroenterology;;   NO PAST SURGERIES     REMOVAL OF STONES  11/02/2020   Procedure: REMOVAL OF STONES;  Surgeon: [name]., MD;  Location: [facility] ENDOSCOPY;  Service: Gastroenterology;;   Francine Graven REMOVAL  11/02/2020   Procedure: STENT REMOVAL;  Surgeon: [name]., MD;  Location: [facility] ENDOSCOPY;  Service: Gastroenterology;;   UPPER ESOPHAGEAL ENDOSCOPIC ULTRASOUND (EUS) N/A 10/20/2022   Procedure: UPPER ESOPHAGEAL ENDOSCOPIC ULTRASOUND (EUS);  Surgeon: [name]., MD;  Location: [facility] ENDOSCOPY;  Service: Gastroenterology;  Laterality: N/A;     A IV Location/Drains/Wounds Patient Lines/Drains/Airways Status     Active Line/Drains/Airways     Name Placement date Placement time Site Days   Peripheral IV 01/12/23 20 G 1" Left;Posterior Hand 01/12/23  1255  Hand  less than 1            Intake/Output Last 24 hours No intake or output data in the 24 hours ending 01/12/23 1558  Labs/Imaging Results for orders placed or performed during the hospital encounter of 01/12/23 (from the past 48 hour(s))  Lipase, blood     Status: Abnormal   Collection Time: 01/12/23 11:30 AM  Result Value Ref Range   Lipase 61 (H) 11 - 51 U/L    Comment: Performed at [facility], [address], [place], [place] [address]  Comprehensive metabolic panel     Status: Abnormal   Collection Time: 01/12/23 11:30 AM  Result Value Ref Range   Sodium 132 (L) 135 - 145 mmol/L   Potassium 3.8 3.5 - 5.1 mmol/L   Chloride 98 98 - 111 mmol/L   CO2 19 (L) 22 - 32 mmol/L   Glucose, Bld 126 (H) 70 - 99 mg/dL    Comment: Glucose reference range applies only to samples taken after fasting for at least 8 hours.   BUN 15 6 - 20  mg/dL   Creatinine, Ser 6.04 (H) 0.61 - 1.24 mg/dL   Calcium 8.8 (L) 8.9 - 10.3 mg/dL   Total Protein 7.0 6.5 - 8.1 g/dL   Albumin 3.0 (L) 3.5 - 5.0 g/dL   AST 48 (H) 15 - 41 U/L   ALT 56 (H) 0 - 44 U/L   Alkaline Phosphatase 61 38 - 126 U/L   Total Bilirubin 1.6 (H) 0.3 - 1.2 mg/dL   GFR, Estimated 41 (L) >60 mL/min    Comment: (NOTE) Calculated using the CKD-EPI Creatinine Equation (2021)    Anion gap 15 5 - 15    Comment: Performed at [facility], [address], [place], [place] [address]  CBC     Status: Abnormal   Collection Time: 01/12/23 11:30 AM  Result Value Ref Range   WBC 7.9 4.0 - 10.5 K/uL   RBC 4.22 4.22 - 5.81 MIL/uL   Hemoglobin 14.3 13.0 - 17.0 g/dL   HCT 11.9 14.7 - 82.9 %   MCV  99.8 80.0 - 100.0 fL   MCH 33.9 26.0 - 34.0 pg   MCHC 34.0 30.0 - 36.0 g/dL   RDW 16.1 (H) 09.6 - 04.5 %   Platelets 175 150 - 400 K/uL   nRBC 0.0 0.0 - 0.2 %    Comment: Performed at [facility], [address], [place], [place] [address]  Urinalysis, Routine w reflex microscopic -Urine, Clean Catch     Status: Abnormal   Collection Time: 01/12/23  2:53 PM  Result Value Ref Range   Color, Urine YELLOW YELLOW   APPearance CLEAR CLEAR   Specific Gravity, Urine 1.018 1.005 - 1.030   pH 5.0 5.0 - 8.0   Glucose, UA NEGATIVE NEGATIVE mg/dL   Hgb urine dipstick SMALL (A) NEGATIVE   Bilirubin Urine NEGATIVE NEGATIVE   Ketones, ur 5 (A) NEGATIVE mg/dL   Protein, ur 30 (A) NEGATIVE mg/dL   Nitrite NEGATIVE NEGATIVE   Leukocytes,Ua TRACE (A) NEGATIVE   RBC / HPF 0-5 0 - 5 RBC/hpf   WBC, UA 6-10 0 - 5 WBC/hpf   Bacteria, UA NONE SEEN NONE SEEN   Squamous Epithelial / HPF 0-5 0 - 5 /HPF   Mucus PRESENT     Comment: Performed at [facility], [address] [place], [place] [address]   No results found.  Pending Labs Unresulted Labs (From admission, onward)     Start     Ordered   01/19/23 0500  Creatinine, serum  (enoxaparin (LOVENOX)    CrCl >/= 30 ml/min)   Weekly,   R     Comments: while on enoxaparin therapy    01/12/23 1538   01/13/23 0500  Comprehensive metabolic panel  Tomorrow morning,   R        01/12/23 1538   01/13/23 0500  CBC  Tomorrow morning,   R        01/12/23 1538            Vitals/Pain Today's Vitals   01/12/23 1117 01/12/23 1127 01/12/23 1128 01/12/23 1549  BP: 110/85   (!) 146/94  Pulse: (!) 101   85  Resp: 18   16  Temp: 98.3 F (36.8 C)   98 F (36.7 C)  TempSrc:    Temporal  SpO2: 98%   99%  Weight:   165 lb (74.8 kg)   Height:   6\' 1"  (1.854 m)   PainSc:  10-Worst pain ever  8     Isolation Precautions No active isolations  Medications Medications  carvedilol (COREG) tablet 3.125 mg (has no administration in time range)  lipase/protease/amylase (CREON) capsule 72,000 Units (has no administration in time range)  lipase/protease/amylase (CREON) capsule 36,000 Units (has no administration in time range)  famotidine (PEPCID) tablet 20 mg (has no administration in time range)  naloxone (NARCAN) nasal spray 4 mg/0.1 mL (has no administration in time range)  enoxaparin (LOVENOX) injection 40 mg (has no administration in time range)  sodium chloride flush (NS) 0.9 % injection 3 mL (has no administration in time range)  0.9 %  sodium chloride infusion (has no administration in time range)  polyethylene glycol (MIRALAX / GLYCOLAX) packet 17 g (has no administration in time range)  acetaminophen (TYLENOL) tablet 650 mg (has no administration in time range)    Or  acetaminophen (TYLENOL) suppository 650 mg (has no administration in time range)  HYDROmorphone (DILAUDID) injection 0.5-1 mg (has no administration in time range)  lactated ringers bolus 2,000 mL (  2,000 mLs Intravenous New Bag/Given 01/12/23 1255)  HYDROmorphone (DILAUDID) injection 0.5 mg (0.5 mg Intravenous Given 01/12/23 1317)  metoCLOPramide (REGLAN) injection 10 mg (10 mg Intravenous Given 01/12/23 1316)  diphenhydrAMINE (BENADRYL) capsule 25  mg (25 mg Oral Given 01/12/23 1317)  HYDROmorphone (DILAUDID) injection 1 mg (1 mg Intravenous Given 01/12/23 1515)    Mobility walks     Focused Assessments GI   R Recommendations: See Admitting Provider Note  Report given to:   Additional Notes:

## 2023-01-12 NOTE — Plan of Care (Signed)

## 2023-01-12 NOTE — ED Triage Notes (Signed)
Pt BIB [facility] from home d/t LUQ abd pain with n/v the last 3 days. Does have Hx of pancreatitis & kidney problems (per pt) & no urine output the last 2 days. A/Ox4, 130/90, CBG 131, 92 bpm NSR, 98% on RA.

## 2023-01-13 DIAGNOSIS — Z79899 Other long term (current) drug therapy: Secondary | ICD-10-CM | POA: Diagnosis not present

## 2023-01-13 DIAGNOSIS — G8929 Other chronic pain: Secondary | ICD-10-CM | POA: Diagnosis present

## 2023-01-13 DIAGNOSIS — M545 Low back pain, unspecified: Secondary | ICD-10-CM | POA: Diagnosis present

## 2023-01-13 DIAGNOSIS — E876 Hypokalemia: Secondary | ICD-10-CM | POA: Diagnosis not present

## 2023-01-13 DIAGNOSIS — Z833 Family history of diabetes mellitus: Secondary | ICD-10-CM | POA: Diagnosis not present

## 2023-01-13 DIAGNOSIS — Z841 Family history of disorders of kidney and ureter: Secondary | ICD-10-CM | POA: Diagnosis not present

## 2023-01-13 DIAGNOSIS — K861 Other chronic pancreatitis: Secondary | ICD-10-CM | POA: Diagnosis present

## 2023-01-13 DIAGNOSIS — Z8249 Family history of ischemic heart disease and other diseases of the circulatory system: Secondary | ICD-10-CM | POA: Diagnosis not present

## 2023-01-13 DIAGNOSIS — E7800 Pure hypercholesterolemia, unspecified: Secondary | ICD-10-CM | POA: Diagnosis present

## 2023-01-13 DIAGNOSIS — Z885 Allergy status to narcotic agent status: Secondary | ICD-10-CM | POA: Diagnosis not present

## 2023-01-13 DIAGNOSIS — N261 Atrophy of kidney (terminal): Secondary | ICD-10-CM | POA: Diagnosis present

## 2023-01-13 DIAGNOSIS — K3184 Gastroparesis: Secondary | ICD-10-CM | POA: Diagnosis present

## 2023-01-13 DIAGNOSIS — K219 Gastro-esophageal reflux disease without esophagitis: Secondary | ICD-10-CM | POA: Diagnosis present

## 2023-01-13 DIAGNOSIS — N179 Acute kidney failure, unspecified: Secondary | ICD-10-CM | POA: Diagnosis present

## 2023-01-13 DIAGNOSIS — Z888 Allergy status to other drugs, medicaments and biological substances status: Secondary | ICD-10-CM | POA: Diagnosis not present

## 2023-01-13 DIAGNOSIS — I10 Essential (primary) hypertension: Secondary | ICD-10-CM | POA: Diagnosis present

## 2023-01-13 DIAGNOSIS — K859 Acute pancreatitis without necrosis or infection, unspecified: Secondary | ICD-10-CM | POA: Diagnosis present

## 2023-01-13 DIAGNOSIS — Z5329 Procedure and treatment not carried out because of patient's decision for other reasons: Secondary | ICD-10-CM | POA: Diagnosis present

## 2023-01-13 LAB — COMPREHENSIVE METABOLIC PANEL
ALT: 25 U/L (ref 0–44)
AST: 21 U/L (ref 15–41)
Albumin: 1.8 g/dL — ABNORMAL LOW (ref 3.5–5.0)
Alkaline Phosphatase: 39 U/L (ref 38–126)
Anion gap: 12 (ref 5–15)
BUN: 6 mg/dL (ref 6–20)
CO2: 20 mmol/L — ABNORMAL LOW (ref 22–32)
Calcium: 6.3 mg/dL — CL (ref 8.9–10.3)
Chloride: 108 mmol/L (ref 98–111)
Creatinine, Ser: 0.92 mg/dL (ref 0.61–1.24)
GFR, Estimated: >60 mL/min (ref >60)
Glucose, Bld: 123 mg/dL — ABNORMAL HIGH (ref 70–99)
Potassium: 2.7 mmol/L — CL (ref 3.5–5.1)
Sodium: 140 mmol/L (ref 135–145)
Total Bilirubin: 0.8 mg/dL (ref 0.3–1.2)
Total Protein: 4.1 g/dL — ABNORMAL LOW (ref 6.5–8.1)

## 2023-01-13 LAB — CBC
HCT: 27.7 % — ABNORMAL LOW (ref 39.0–52.0)
Hemoglobin: 9.5 g/dL — ABNORMAL LOW (ref 13.0–17.0)
MCH: 34.5 pg — ABNORMAL HIGH (ref 26.0–34.0)
MCHC: 34.3 g/dL (ref 30.0–36.0)
MCV: 100.7 fL — ABNORMAL HIGH (ref 80.0–100.0)
Platelets: 118 10*3/uL — ABNORMAL LOW (ref 150–400)
RBC: 2.75 MIL/uL — ABNORMAL LOW (ref 4.22–5.81)
RDW: 16.1 % — ABNORMAL HIGH (ref 11.5–15.5)
WBC: 4.3 10*3/uL (ref 4.0–10.5)
nRBC: 0 % (ref 0.0–0.2)

## 2023-01-13 LAB — MAGNESIUM: Magnesium: 1.7 mg/dL (ref 1.7–2.4)

## 2023-01-13 MED ORDER — POTASSIUM CHLORIDE 10 MEQ/100ML IV SOLN
10.0000 meq | INTRAVENOUS | Status: AC
  Administered 2023-01-13 (×4): 10 meq via INTRAVENOUS
  Filled 2023-01-13 (×4): qty 100

## 2023-01-13 MED ORDER — ONDANSETRON HCL 4 MG/2ML IJ SOLN
4.0000 mg | Freq: Four times a day (QID) | INTRAMUSCULAR | Status: DC | PRN
  Administered 2023-01-13 – 2023-01-15 (×4): 4 mg via INTRAVENOUS
  Filled 2023-01-13 (×4): qty 2

## 2023-01-13 NOTE — Progress Notes (Signed)
MEWS Progress Note  Patient Details Name: Kyle Berry MRN: [id number] DOB: June 22, 1970 Today's Date: 01/13/2023   MEWS Flowsheet Documentation:  Assess: MEWS Score Temp: 98 F (36.7 C) BP: (!) 168/92 MAP (mmHg): 114 Pulse Rate: 62 Resp: 17 Level of Consciousness: Alert SpO2: 99 % O2 Device: Room Air Assess: MEWS Score MEWS Temp: 0 MEWS Systolic: 0 MEWS Pulse: 0 MEWS RR: 0 MEWS LOC: 0 MEWS Score: 0 MEWS Score Color: Green Assess: SIRS CRITERIA SIRS Temperature : 0 SIRS Respirations : 0 SIRS Pulse: 0 SIRS WBC: 0 SIRS Score Sum : 0          Santiago Bumpers 01/13/2023, 1:40 PM

## 2023-01-13 NOTE — Progress Notes (Signed)
MEWS Progress Note  Patient Details Name: Kyle Berry MRN: [id number] DOB: 06-21-1970 Today's Date: 01/13/2023   MEWS Flowsheet Documentation:  Assess: MEWS Score Temp: 98 F (36.7 C) BP: (!) 168/92 MAP (mmHg): 114 Pulse Rate: 62 Resp: 17 Level of Consciousness: Alert SpO2: 99 % O2 Device: Room Air Assess: MEWS Score MEWS Temp: 0 MEWS Systolic: 0 MEWS Pulse: 0 MEWS RR: 0 MEWS LOC: 0 MEWS Score: 0 MEWS Score Color: Green Assess: SIRS CRITERIA SIRS Temperature : 0 SIRS Respirations : 0 SIRS Pulse: 0 SIRS WBC: 0 SIRS Score Sum : 0          Santiago Bumpers 01/13/2023, 12:10 PM

## 2023-01-13 NOTE — Progress Notes (Signed)
[facility]  PROGRESS NOTE  Kyle Berry ZOX:[id number] DOB: 29-Jul-1970 DOA: 01/12/2023 PCP: [facility]   Brief HPI:   52 y.o. male with medical history significant of chronic pancreatitis, alcohol use, pancreatic insufficiency, Solitary kidney, HTN, HLD presenting with abdominal pain. Patient presenting with recurrent abdominal pain.  Patient has known history of chronic pancreatitis with multiple recurrent flares.     Assessment/Plan:   Acute on chronic pancreatitis -Pancreatic insufficiency -Patient presented with abdominal pain, similar to prior episodes -No alcohol use since last admission -Continue IV fluids -Continue as needed Dilaudid -Continue Pepcid, Creon -Start soft diet  Acute kidney injury -Creatinine was elevated to 1.9, baseline 1.0 -Labs are pending today -Will follow serum creatinine  Hypertension -Continue carvedilol  Hyperlipidemia -Not on medications   Medications     carvedilol  3.125 mg Oral BID WC   enoxaparin (LOVENOX) injection  40 mg Subcutaneous Q24H   famotidine  20 mg Oral Daily   influenza vac split trivalent PF  0.5 mL Intramuscular Tomorrow-1000   lipase/protease/amylase  72,000 Units Oral TID with meals   sodium chloride flush  3 mL Intravenous Q12H     Data Reviewed:   CBG:  No results for input(s): "GLUCAP" in the last 168 hours.  SpO2: 99 %    Vitals:   01/12/23 1637 01/12/23 1959 01/13/23 0512 01/13/23 0808  BP: (!) 151/102 (!) 156/96 (!) 141/88 (!) 168/92  Pulse: 98 79 64 62  Resp: 18 18 18 17   Temp: 97.7 F (36.5 C) 98.2 F (36.8 C) 98 F (36.7 C) 98 F (36.7 C)  TempSrc: Oral Oral Oral Oral  SpO2: 100% 97% 98% 99%  Weight:      Height:          Data Reviewed:  Basic Metabolic Panel: Recent Labs  Lab 01/12/23 1130  NA 132*  K 3.8  CL 98  CO2 19*  GLUCOSE 126*  BUN 15  CREATININE 1.91*  CALCIUM 8.8*    CBC: Recent Labs  Lab 01/12/23 1130  WBC 7.9   HGB 14.3  HCT 42.1  MCV 99.8  PLT 175    LFT Recent Labs  Lab 01/12/23 1130  AST 48*  ALT 56*  ALKPHOS 61  BILITOT 1.6*  PROT 7.0  ALBUMIN 3.0*     Antibiotics: Anti-infectives (From admission, onward)    None        DVT prophylaxis: Lovenox  Code Status: Full code  Family Communication: No family at bedside   CONSULTS    Subjective   Still complains of abdominal pain.  Wants to try soft diet.   Objective    Physical Examination:   General-appears in no acute distress Heart-S1-S2, regular, no murmur auscultated Lungs-clear to auscultation bilaterally, no wheezing or crackles auscultated Abdomen-soft, nontender, no organomegaly Extremities-no edema in the lower extremities Neuro-alert, oriented x3, no focal deficit noted  Status is: Inpatient:         Meredeth Ide   [facility] If 7PM-7AM, please contact night-coverage at [email or web address], Office  [phone]   01/13/2023, 2:16 PM  LOS: 0 days

## 2023-01-13 NOTE — Plan of Care (Signed)
  Problem: Education: Goal: Knowledge of General Education information will improve Description: Including pain rating scale, medication(s)/side effects and non-pharmacologic comfort measures Outcome: Progressing   Problem: Health Behavior/Discharge Planning: Goal: Ability to manage health-related needs will improve Outcome: Progressing   Problem: Clinical Measurements: Goal: Will remain free from infection Outcome: Progressing   

## 2023-01-14 DIAGNOSIS — I10 Essential (primary) hypertension: Secondary | ICD-10-CM | POA: Diagnosis not present

## 2023-01-14 DIAGNOSIS — N179 Acute kidney failure, unspecified: Secondary | ICD-10-CM | POA: Diagnosis not present

## 2023-01-14 DIAGNOSIS — K861 Other chronic pancreatitis: Secondary | ICD-10-CM | POA: Diagnosis not present

## 2023-01-14 DIAGNOSIS — K859 Acute pancreatitis without necrosis or infection, unspecified: Secondary | ICD-10-CM | POA: Diagnosis not present

## 2023-01-14 LAB — BASIC METABOLIC PANEL
Anion gap: 7 (ref 5–15)
BUN: 5 mg/dL — ABNORMAL LOW (ref 6–20)
CO2: 27 mmol/L (ref 22–32)
Calcium: 8.8 mg/dL — ABNORMAL LOW (ref 8.9–10.3)
Chloride: 100 mmol/L (ref 98–111)
Creatinine, Ser: 1.34 mg/dL — ABNORMAL HIGH (ref 0.61–1.24)
GFR, Estimated: >60 mL/min (ref >60)
Glucose, Bld: 160 mg/dL — ABNORMAL HIGH (ref 70–99)
Potassium: 3.6 mmol/L (ref 3.5–5.1)
Sodium: 134 mmol/L — ABNORMAL LOW (ref 135–145)

## 2023-01-14 MED ORDER — LACTATED RINGERS IV SOLN: INTRAVENOUS | Status: AC

## 2023-01-14 MED ORDER — MAGNESIUM SULFATE IN D5W 1-5 GM/100ML-% IV SOLN
1.0000 g | Freq: Once | INTRAVENOUS | Status: AC
  Administered 2023-01-14: 1 g via INTRAVENOUS
  Filled 2023-01-14: qty 100

## 2023-01-14 NOTE — Plan of Care (Signed)
  Problem: Education: Goal: Knowledge of General Education information will improve Description: Including pain rating scale, medication(s)/side effects and non-pharmacologic comfort measures 01/14/2023 0546 by Lexine Baton, RN Outcome: Progressing 01/14/2023 0545 by Lexine Baton, RN Outcome: Progressing   Problem: Health Behavior/Discharge Planning: Goal: Ability to manage health-related needs will improve 01/14/2023 0546 by Lexine Baton, RN Outcome: Progressing 01/14/2023 0545 by Lexine Baton, RN Outcome: Progressing   Problem: Clinical Measurements: Goal: Ability to maintain clinical measurements within normal limits will improve 01/14/2023 0546 by Lexine Baton, RN Outcome: Progressing 01/14/2023 0545 by Lexine Baton, RN Outcome: Progressing

## 2023-01-14 NOTE — Progress Notes (Signed)
[facility]  PROGRESS NOTE  Kyle Berry UEA:[id number] DOB: 01-11-1971 DOA: 01/12/2023 PCP: [facility]   Brief HPI:   52 y.o. male with medical history significant of chronic pancreatitis, alcohol use, pancreatic insufficiency, Solitary kidney, HTN, HLD presenting with abdominal pain. Patient presenting with recurrent abdominal pain.  Patient has known history of chronic pancreatitis with multiple recurrent flares.     Assessment/Plan:   Acute on chronic pancreatitis -Pancreatic insufficiency -Patient presented with abdominal pain, similar to prior episodes -No alcohol use since last admission -Continue IV fluids -Continue as needed Dilaudid -Continue Pepcid, Creon -Start soft diet  Acute kidney injury -Resolved  Hypertension -Continue carvedilol  Hyperlipidemia -Not on medications  Hypokalemia -Potassium was replaced -Will check serum potassium today Magnesium 1.7, will give 1 g of magnesium sulfate IV x 1  Medications     carvedilol  3.125 mg Oral BID WC   famotidine  20 mg Oral Daily   influenza vac split trivalent PF  0.5 mL Intramuscular Tomorrow-1000   lipase/protease/amylase  72,000 Units Oral TID with meals   sodium chloride flush  3 mL Intravenous Q12H     Data Reviewed:   CBG:  No results for input(s): "GLUCAP" in the last 168 hours.  SpO2: 99 %    Vitals:   01/13/23 0808 01/13/23 1514 01/13/23 2020 01/14/23 0519  BP: (!) 168/92 (!) 156/95 (!) 178/95 (!) 165/102  Pulse: 62 69 65 70  Resp: 17 18 19 19   Temp: 98 F (36.7 C) (!) 97.5 F (36.4 C) 97.8 F (36.6 C) 98.6 F (37 C)  TempSrc: Oral Oral Oral   SpO2: 99% 97% 96% 99%  Weight:      Height:          Data Reviewed:  Basic Metabolic Panel: Recent Labs  Lab 01/12/23 1130 01/13/23 1440 01/13/23 1929  NA 132* 140  --   K 3.8 2.7*  --   CL 98 108  --   CO2 19* 20*  --   GLUCOSE 126* 123*  --   BUN 15 6  --   CREATININE 1.91* 0.92  --    CALCIUM 8.8* 6.3*  --   MG  --   --  1.7    CBC: Recent Labs  Lab 01/12/23 1130 01/13/23 1440  WBC 7.9 4.3  HGB 14.3 9.5*  HCT 42.1 27.7*  MCV 99.8 100.7*  PLT 175 118*    LFT Recent Labs  Lab 01/12/23 1130 01/13/23 1440  AST 48* 21  ALT 56* 25  ALKPHOS 61 39  BILITOT 1.6* 0.8  PROT 7.0 4.1*  ALBUMIN 3.0* 1.8*     Antibiotics: Anti-infectives (From admission, onward)    None        DVT prophylaxis: Lovenox  Code Status: Full code  Family Communication: No family at bedside   CONSULTS    Subjective   Still complains of nausea and abdominal pain.  Objective    Physical Examination:  General-appears in no acute distress Heart-S1-S2, regular, no murmur auscultated Lungs-clear to auscultation bilaterally, no wheezing or crackles auscultated Abdomen-soft, nontender, no organomegaly Extremities-no edema in the lower extremities Neuro-alert, oriented x3, no focal deficit noted  Status is: Inpatient:         Meredeth Ide   [facility] If 7PM-7AM, please contact night-coverage at [email or web address], Office  [phone]   01/14/2023, 7:41 AM  LOS: 1 day

## 2023-01-15 NOTE — Discharge Summary (Signed)
  Left AMA  52 y.o. male with medical history significant of chronic pancreatitis, alcohol use, pancreatic insufficiency, Solitary kidney, HTN, HLD presenting with abdominal pain. Patient presenting with recurrent abdominal pain.  Patient has known history of chronic pancreatitis with multiple recurrent flares.   He was getting IV fluid and pain medications for acute on chronic pancreatitis. This morning he was not found in his room.  Called RN, she confirmed that patient was in the room this morning but not in the room or in the hospital.  He also had IV lines.  Security was notified, nonemergency line was called for wellness check and remove 2 lines in the left arm.

## 2023-01-15 NOTE — Plan of Care (Signed)
  Problem: Clinical Measurements: Goal: Will remain free from infection Outcome: Progressing   Problem: Clinical Measurements: Goal: Diagnostic test results will improve Outcome: Progressing   Problem: Clinical Measurements: Goal: Respiratory complications will improve Outcome: Progressing   

## 2023-01-15 NOTE — Progress Notes (Addendum)
0745: RN did bedside report. Patient was sitting on the bed with no concerns.  0815: NT saw the patient standing at the door looking down the hallway. NT asked if patient needed any help but patient denied and walked back into the room.  Around 0820 Dr. Sharl Ma spoke with RN and asked where the patient was. Patient is no where to be found on the unit. Security notified. Non-emergency line called for wellness check and to remove two IV lines in the left arm.   -Attempted to call mother and patient. Patients phone number listed in the chart is disconnected. Mother did not answer. Provider aware.   [place] [facility] planning/management officer followed up the patient is at home doing well with no IV access.

## 2023-03-23 ENCOUNTER — Encounter (HOSPITAL_COMMUNITY): Payer: Self-pay | Admitting: *Deleted

## 2023-03-23 ENCOUNTER — Other Ambulatory Visit: Payer: Self-pay

## 2023-03-23 ENCOUNTER — Inpatient Hospital Stay (HOSPITAL_COMMUNITY)
Admission: EM | Admit: 2023-03-23 | Discharge: 2023-03-27 | DRG: 440 | Disposition: A | Discharge status: Home / Self Care | Payer: Managed Care, Other (non HMO) | Attending: Internal Medicine | Admitting: Internal Medicine

## 2023-03-23 ENCOUNTER — Emergency Department (HOSPITAL_COMMUNITY): Payer: Managed Care, Other (non HMO)

## 2023-03-23 DIAGNOSIS — F17200 Nicotine dependence, unspecified, uncomplicated: Secondary | ICD-10-CM | POA: Diagnosis present

## 2023-03-23 DIAGNOSIS — Z87442 Personal history of urinary calculi: Secondary | ICD-10-CM | POA: Diagnosis not present

## 2023-03-23 DIAGNOSIS — Z8249 Family history of ischemic heart disease and other diseases of the circulatory system: Secondary | ICD-10-CM | POA: Diagnosis not present

## 2023-03-23 DIAGNOSIS — F1090 Alcohol use, unspecified, uncomplicated: Secondary | ICD-10-CM | POA: Diagnosis present

## 2023-03-23 DIAGNOSIS — Z79899 Other long term (current) drug therapy: Secondary | ICD-10-CM | POA: Diagnosis not present

## 2023-03-23 DIAGNOSIS — J42 Unspecified chronic bronchitis: Secondary | ICD-10-CM | POA: Diagnosis present

## 2023-03-23 DIAGNOSIS — Z833 Family history of diabetes mellitus: Secondary | ICD-10-CM | POA: Diagnosis not present

## 2023-03-23 DIAGNOSIS — K219 Gastro-esophageal reflux disease without esophagitis: Secondary | ICD-10-CM | POA: Diagnosis present

## 2023-03-23 DIAGNOSIS — Z9049 Acquired absence of other specified parts of digestive tract: Secondary | ICD-10-CM

## 2023-03-23 DIAGNOSIS — E876 Hypokalemia: Secondary | ICD-10-CM | POA: Diagnosis present

## 2023-03-23 DIAGNOSIS — N200 Calculus of kidney: Secondary | ICD-10-CM

## 2023-03-23 DIAGNOSIS — Z800 Family history of malignant neoplasm of digestive organs: Secondary | ICD-10-CM | POA: Diagnosis not present

## 2023-03-23 DIAGNOSIS — Q600 Renal agenesis, unilateral: Secondary | ICD-10-CM

## 2023-03-23 DIAGNOSIS — Z8701 Personal history of pneumonia (recurrent): Secondary | ICD-10-CM

## 2023-03-23 DIAGNOSIS — K852 Alcohol induced acute pancreatitis without necrosis or infection: Principal | ICD-10-CM

## 2023-03-23 DIAGNOSIS — Z841 Family history of disorders of kidney and ureter: Secondary | ICD-10-CM | POA: Diagnosis not present

## 2023-03-23 DIAGNOSIS — Z716 Tobacco abuse counseling: Secondary | ICD-10-CM | POA: Diagnosis not present

## 2023-03-23 DIAGNOSIS — Z888 Allergy status to other drugs, medicaments and biological substances status: Secondary | ICD-10-CM

## 2023-03-23 DIAGNOSIS — Z885 Allergy status to narcotic agent status: Secondary | ICD-10-CM

## 2023-03-23 DIAGNOSIS — E7800 Pure hypercholesterolemia, unspecified: Secondary | ICD-10-CM | POA: Diagnosis present

## 2023-03-23 DIAGNOSIS — K3184 Gastroparesis: Secondary | ICD-10-CM | POA: Diagnosis present

## 2023-03-23 DIAGNOSIS — I10 Essential (primary) hypertension: Secondary | ICD-10-CM

## 2023-03-23 DIAGNOSIS — K859 Acute pancreatitis without necrosis or infection, unspecified: Principal | ICD-10-CM | POA: Diagnosis present

## 2023-03-23 DIAGNOSIS — K861 Other chronic pancreatitis: Secondary | ICD-10-CM | POA: Diagnosis not present

## 2023-03-23 DIAGNOSIS — N261 Atrophy of kidney (terminal): Secondary | ICD-10-CM | POA: Diagnosis present

## 2023-03-23 DIAGNOSIS — K860 Alcohol-induced chronic pancreatitis: Secondary | ICD-10-CM | POA: Diagnosis present

## 2023-03-23 LAB — RAPID URINE DRUG SCREEN, HOSP PERFORMED
Amphetamines: NOT DETECTED
Barbiturates: NOT DETECTED
Benzodiazepines: NOT DETECTED
Cocaine: NOT DETECTED
Opiates: NOT DETECTED
Tetrahydrocannabinol: NOT DETECTED

## 2023-03-23 LAB — URINALYSIS, ROUTINE W REFLEX MICROSCOPIC
Bacteria, UA: NONE SEEN
Bilirubin Urine: NEGATIVE
Glucose, UA: NEGATIVE mg/dL
Ketones, ur: 5 mg/dL — AB
Leukocytes,Ua: NEGATIVE
Nitrite: NEGATIVE
Protein, ur: 30 mg/dL — AB
Specific Gravity, Urine: 1.035 — ABNORMAL HIGH (ref 1.005–1.030)
pH: 5 (ref 5.0–8.0)

## 2023-03-23 LAB — COMPREHENSIVE METABOLIC PANEL
ALT: 24 U/L (ref 0–44)
AST: 41 U/L (ref 15–41)
Albumin: 4 g/dL (ref 3.5–5.0)
Alkaline Phosphatase: 70 U/L (ref 38–126)
Anion gap: 13 (ref 5–15)
BUN: 7 mg/dL (ref 6–20)
CO2: 22 mmol/L (ref 22–32)
Calcium: 8.9 mg/dL (ref 8.9–10.3)
Chloride: 104 mmol/L (ref 98–111)
Creatinine, Ser: 1.17 mg/dL (ref 0.61–1.24)
GFR, Estimated: >60 mL/min (ref >60)
Glucose, Bld: 133 mg/dL — ABNORMAL HIGH (ref 70–99)
Potassium: 3.8 mmol/L (ref 3.5–5.1)
Sodium: 139 mmol/L (ref 135–145)
Total Bilirubin: 1.9 mg/dL — ABNORMAL HIGH (ref <1.2)
Total Protein: 7.4 g/dL (ref 6.5–8.1)

## 2023-03-23 LAB — CBC
HCT: 40.5 % (ref 39.0–52.0)
Hemoglobin: 14 g/dL (ref 13.0–17.0)
MCH: 36.5 pg — ABNORMAL HIGH (ref 26.0–34.0)
MCHC: 34.6 g/dL (ref 30.0–36.0)
MCV: 105.5 fL — ABNORMAL HIGH (ref 80.0–100.0)
Platelets: 199 10*3/uL (ref 150–400)
RBC: 3.84 MIL/uL — ABNORMAL LOW (ref 4.22–5.81)
RDW: 16.4 % — ABNORMAL HIGH (ref 11.5–15.5)
WBC: 8.5 10*3/uL (ref 4.0–10.5)
nRBC: 0 % (ref 0.0–0.2)

## 2023-03-23 LAB — LIPASE, BLOOD: Lipase: 290 U/L — ABNORMAL HIGH (ref 11–51)

## 2023-03-23 MED ORDER — ONDANSETRON HCL 4 MG/2ML IJ SOLN
4.0000 mg | Freq: Four times a day (QID) | INTRAMUSCULAR | Status: DC | PRN
  Administered 2023-03-24 – 2023-03-26 (×4): 4 mg via INTRAVENOUS
  Filled 2023-03-23 (×4): qty 2

## 2023-03-23 MED ORDER — NICOTINE 7 MG/24HR TD PT24
7.0000 mg | MEDICATED_PATCH | Freq: Every day | TRANSDERMAL | Status: DC
  Administered 2023-03-23 – 2023-03-26 (×4): 7 mg via TRANSDERMAL
  Filled 2023-03-23 (×5): qty 1

## 2023-03-23 MED ORDER — CITALOPRAM HYDROBROMIDE 20 MG PO TABS
40.0000 mg | ORAL_TABLET | Freq: Every day | ORAL | Status: DC
  Administered 2023-03-25 – 2023-03-27 (×3): 40 mg via ORAL
  Filled 2023-03-23 (×4): qty 2

## 2023-03-23 MED ORDER — ONDANSETRON HCL 4 MG/2ML IJ SOLN
4.0000 mg | Freq: Once | INTRAMUSCULAR | Status: AC
  Administered 2023-03-23: 4 mg via INTRAVENOUS
  Filled 2023-03-23: qty 2

## 2023-03-23 MED ORDER — FAMOTIDINE 20 MG PO TABS
20.0000 mg | ORAL_TABLET | Freq: Every day | ORAL | Status: DC
  Administered 2023-03-25 – 2023-03-27 (×3): 20 mg via ORAL
  Filled 2023-03-23 (×4): qty 1

## 2023-03-23 MED ORDER — SODIUM CHLORIDE 0.9 % IV BOLUS
1000.0000 mL | Freq: Once | INTRAVENOUS | Status: AC
  Administered 2023-03-23: 1000 mL via INTRAVENOUS

## 2023-03-23 MED ORDER — PANCRELIPASE (LIP-PROT-AMYL) 36000-114000 UNITS PO CPEP
72000.0000 [IU] | ORAL_CAPSULE | Freq: Three times a day (TID) | ORAL | Status: DC
  Administered 2023-03-25 – 2023-03-27 (×5): 72000 [IU] via ORAL
  Filled 2023-03-23 (×10): qty 2

## 2023-03-23 MED ORDER — SODIUM CHLORIDE 0.9 % IV SOLN: INTRAVENOUS | Status: DC

## 2023-03-23 MED ORDER — PANCRELIPASE (LIP-PROT-AMYL) 36000-114000 UNITS PO CPEP: 36000.0000 [IU] | ORAL_CAPSULE | ORAL | Status: DC

## 2023-03-23 MED ORDER — ALBUTEROL SULFATE (2.5 MG/3ML) 0.083% IN NEBU: 2.5000 mg | INHALATION_SOLUTION | RESPIRATORY_TRACT | Status: DC | PRN

## 2023-03-23 MED ORDER — IOHEXOL 350 MG/ML SOLN
75.0000 mL | Freq: Once | INTRAVENOUS | Status: AC | PRN
  Administered 2023-03-23: 75 mL via INTRAVENOUS

## 2023-03-23 MED ORDER — ONDANSETRON HCL 4 MG PO TABS: 4.0000 mg | ORAL_TABLET | Freq: Four times a day (QID) | ORAL | Status: DC | PRN

## 2023-03-23 MED ORDER — ACETAMINOPHEN 650 MG RE SUPP: 650.0000 mg | Freq: Four times a day (QID) | RECTAL | Status: DC | PRN

## 2023-03-23 MED ORDER — PANCRELIPASE (LIP-PROT-AMYL) 36000-114000 UNITS PO CPEP
36000.0000 [IU] | ORAL_CAPSULE | ORAL | Status: DC
  Administered 2023-03-25: 36000 [IU] via ORAL

## 2023-03-23 MED ORDER — GABAPENTIN 300 MG PO CAPS
300.0000 mg | ORAL_CAPSULE | Freq: Three times a day (TID) | ORAL | Status: DC
  Administered 2023-03-23 – 2023-03-27 (×8): 300 mg via ORAL
  Filled 2023-03-23 (×9): qty 1

## 2023-03-23 MED ORDER — ACETAMINOPHEN 325 MG PO TABS
650.0000 mg | ORAL_TABLET | Freq: Four times a day (QID) | ORAL | Status: DC | PRN
Start: 2023-03-23 — End: 2023-03-27

## 2023-03-23 MED ORDER — HYDROCODONE-ACETAMINOPHEN 5-325 MG PO TABS
1.0000 | ORAL_TABLET | ORAL | Status: DC | PRN
  Administered 2023-03-24 – 2023-03-27 (×5): 2 via ORAL
  Filled 2023-03-23 (×6): qty 2

## 2023-03-23 MED ORDER — TRAMADOL HCL 50 MG PO TABS
50.0000 mg | ORAL_TABLET | Freq: Four times a day (QID) | ORAL | Status: DC | PRN
  Filled 2023-03-23: qty 1

## 2023-03-23 MED ORDER — HYDROMORPHONE HCL 1 MG/ML IJ SOLN
1.0000 mg | Freq: Once | INTRAMUSCULAR | Status: AC
  Administered 2023-03-23: 1 mg via INTRAVENOUS
  Filled 2023-03-23: qty 1

## 2023-03-23 MED ORDER — METOCLOPRAMIDE HCL 5 MG/ML IJ SOLN
5.0000 mg | Freq: Four times a day (QID) | INTRAMUSCULAR | Status: DC | PRN
  Administered 2023-03-23 – 2023-03-25 (×4): 5 mg via INTRAVENOUS
  Filled 2023-03-23 (×4): qty 2

## 2023-03-23 MED ORDER — HYDROMORPHONE HCL 1 MG/ML IJ SOLN
0.5000 mg | INTRAMUSCULAR | Status: DC | PRN
  Administered 2023-03-23 – 2023-03-25 (×8): 1 mg via INTRAVENOUS
  Administered 2023-03-26: 0.5 mg via INTRAVENOUS
  Administered 2023-03-26: 1 mg via INTRAVENOUS
  Filled 2023-03-23 (×10): qty 1

## 2023-03-23 MED ORDER — CARVEDILOL 3.125 MG PO TABS
3.1250 mg | ORAL_TABLET | Freq: Two times a day (BID) | ORAL | Status: DC
  Administered 2023-03-24 – 2023-03-27 (×6): 3.125 mg via ORAL
  Filled 2023-03-23 (×7): qty 1

## 2023-03-23 NOTE — Assessment & Plan Note (Signed)
Restart coreg 3.125 mg po BID

## 2023-03-23 NOTE — H&P (Signed)
Kyle Berry:[id number] DOB: March 11, 1971 DOA: 03/23/2023     PCP: [facility]   Outpatient Specialists:   CARDS:  Dr. Chilton Si, MD    Patient arrived to ER on 03/23/23 at 1646 Referred by Attending Gloris Manchester, MD   Patient coming from:    home Lives  With family     Chief Complaint:   Chief Complaint  Patient presents with   Abdominal Pain    HPI: Kyle Berry is a 52 y.o. male with medical history significant of Chronic pancreatitis, CKD, HTN, Congenital single kidney     Presented with  abd pain  Presents with abd pain similar to prior chronic pancreatitis  Decreased ETOH but still uses Last admit for the same in September    Reports vomiting his bp meds up today Reports chills   Denies significant ETOH intake  last was a sip of beer 3 days ago Does not smoke   Lab Results  Component Value Date   SARSCOV2NAA NEGATIVE 04/21/2021   SARSCOV2NAA NEGATIVE 02/01/2021   SARSCOV2NAA NEGATIVE 12/23/2020   SARSCOV2NAA POSITIVE (A) 11/17/2020    Regarding pertinent Chronic problems:       HTN on Coreg   last echo  Recent Results (from the past 95621 hour(s))  ECHOCARDIOGRAM COMPLETE   Collection Time: 11/18/20  9:35 AM  Result Value   Weight 2,500.8   Height 73   BP 144/90   S' Lateral 3.00   AR max vel 3.52   AV Area VTI 3.49   AV Mean grad 2.0   AV Peak grad 3.6   Ao pk vel 0.96   Area-P 1/2 2.42   AV Area mean vel 3.31   MV VTI 2.68   Narrative      ECHOCARDIOGRAM REPORT      Sonographer:    Ross Ludwig RDCS (AE) Referring Phys: [id number] DAWOOD Teena Irani    Sonographer Comments: COVID-19. IMPRESSIONS    1. Left ventricular ejection fraction by 3D volume is 57 %. The left ventricle has normal function. The left ventricle has no regional wall motion abnormalities. There is mild left ventricular hypertrophy. Left ventricular diastolic parameters were  normal.  2. Right ventricular systolic function  is normal. The right ventricular size is normal.  3. The mitral valve is grossly normal. Trivial mitral valve regurgitation. No evidence of mitral stenosis.  4. The aortic valve is normal in structure. Aortic valve regurgitation is not visualized. No aortic stenosis is present.  5. Aortic dilatation noted. There is borderline dilatation of the ascending aorta, measuring 39 mm.         While in ER:    CT showing acute on chronic pancreatitis    Lab Orders         Lipase, blood         Comprehensive metabolic panel         CBC         Urinalysis, Routine w reflex microscopic -Urine, Clean Catch       CTabd/pelvis - Acute on chronic pancreatitis, as above. No well-defined fluid collection/pseudocyst.  10 mm nonobstructing left lower pole renal calculus   Following Medications were ordered in ER: Medications  sodium chloride 0.9 % bolus 1,000 mL (0 mLs Intravenous Stopped 03/23/23 2036)  ondansetron (ZOFRAN) injection 4 mg (4 mg Intravenous Given 03/23/23 1834)  HYDROmorphone (DILAUDID) injection 1 mg (1 mg Intravenous Given 03/23/23 1834)  iohexol (OMNIPAQUE) 350 MG/ML injection  75 mL (75 mLs Intravenous Contrast Given 03/23/23 1850)  HYDROmorphone (DILAUDID) injection 1 mg (1 mg Intravenous Given 03/23/23 1949)      ED Triage Vitals  Encounter Vitals Group     BP 03/23/23 1656 (!) 162/113     Systolic BP Percentile --      Diastolic BP Percentile --      Pulse Rate 03/23/23 1656 90     Resp 03/23/23 1656 (!) 22     Temp 03/23/23 1656 99.1 F (37.3 C)     Temp Source 03/23/23 2056 Oral     SpO2 03/23/23 1656 99 %     Weight 03/23/23 1657 161 lb 6 oz (73.2 kg)     Height 03/23/23 1657 6\' 1"  (1.854 m)     Head Circumference --      Peak Flow --      Pain Score 03/23/23 1657 10     Pain Loc --      Pain Education --      Exclude from Growth Chart --   ZOXW(96)@     _________________________________________ Significant initial  Findings: Abnormal Labs Reviewed  LIPASE, BLOOD  - Abnormal; Notable for the following components:      Result Value   Lipase 290 (*)    All other components within normal limits  COMPREHENSIVE METABOLIC PANEL - Abnormal; Notable for the following components:   Glucose, Bld 133 (*)    Total Bilirubin 1.9 (*)    All other components within normal limits  CBC - Abnormal; Notable for the following components:   RBC 3.84 (*)    MCV 105.5 (*)    MCH 36.5 (*)    RDW 16.4 (*)    All other components within normal limits  URINALYSIS, ROUTINE W REFLEX MICROSCOPIC - Abnormal; Notable for the following components:   Specific Gravity, Urine 1.035 (*)    Hgb urine dipstick SMALL (*)    Ketones, ur 5 (*)    Protein, ur 30 (*)    All other components within normal limits     ECG: Ordered    The recent clinical data is shown below. Vitals:   03/23/23 1656 03/23/23 1657 03/23/23 2030 03/23/23 2056  BP: (!) 162/113  (!) 181/105   Pulse: 90  97   Resp: (!) 22  20   Temp: 99.1 F (37.3 C)   98.8 F (37.1 C)  TempSrc:    Oral  SpO2: 99%  100%   Weight:  73.2 kg    Height:  6\' 1"  (1.854 m)      WBC     Component Value Date/Time   WBC 8.5 03/23/2023 1708   LYMPHSABS 1.7 10/17/2022 1117   MONOABS 0.5 10/17/2022 1117   EOSABS 0.1 10/17/2022 1117   BASOSABS 0.0 10/17/2022 1117       UA   no evidence of UTI     Urine analysis:    Component Value Date/Time   COLORURINE YELLOW 03/23/2023 1701   APPEARANCEUR CLEAR 03/23/2023 1701   LABSPEC 1.035 (H) 03/23/2023 1701   PHURINE 5.0 03/23/2023 1701   GLUCOSEU NEGATIVE 03/23/2023 1701   HGBUR SMALL (A) 03/23/2023 1701   BILIRUBINUR NEGATIVE 03/23/2023 1701   KETONESUR 5 (A) 03/23/2023 1701   PROTEINUR 30 (A) 03/23/2023 1701   UROBILINOGEN 0.2 01/20/2015 1643   NITRITE NEGATIVE 03/23/2023 1701   LEUKOCYTESUR NEGATIVE 03/23/2023 1701    Results for orders placed or performed during the hospital encounter of 04/21/21  Urine  Culture     Status: None   Collection Time: 04/21/21  6:23  AM   Specimen: Urine, Clean Catch  Result Value Ref Range Status   Specimen Description URINE, CLEAN CATCH  Final   Special Requests NONE  Final   Culture   Final    NO GROWTH Performed at [facility], [address], [place], [place] [address]    Report Status 04/23/2021 FINAL  Final  Resp Panel by RT-PCR (Flu A&B, Covid) Nasopharyngeal Swab     Status: None   Collection Time: 04/21/21  5:21 PM   Specimen: Nasopharyngeal Swab; Nasopharyngeal(NP) swabs in vial transport medium  Result Value Ref Range Status     __________________________________________________________ Recent Labs  Lab 03/23/23 1708  NA 139  K 3.8  CO2 22  GLUCOSE 133*  BUN 7  CREATININE 1.17  CALCIUM 8.9    Cr   stable,   Lab Results  Component Value Date   CREATININE 1.17 03/23/2023   CREATININE 1.34 (H) 01/14/2023   CREATININE 0.92 01/13/2023    Recent Labs  Lab 03/23/23 1708  AST 41  ALT 24  ALKPHOS 70  BILITOT 1.9*  PROT 7.4  ALBUMIN 4.0   Lab Results  Component Value Date   CALCIUM 8.9 03/23/2023   PHOS 3.9 09/25/2021        Plt: Lab Results  Component Value Date   PLT 199 03/23/2023    Recent Labs  Lab 03/23/23 1708  WBC 8.5  HGB 14.0  HCT 40.5  MCV 105.5*  PLT 199    HG/HCT  stable,       Component Value Date/Time   HGB 14.0 03/23/2023 1708   HCT 40.5 03/23/2023 1708   MCV 105.5 (H) 03/23/2023 1708    Recent Labs  Lab 03/23/23 1708  LIPASE 290*     _______________________________________________ Hospitalist was called for admission for   Acute pancreatitis, unspecified complication status, unspecified pancreatitis type    The following Work up has been ordered so far:  Orders Placed This Encounter  Procedures   CT ABDOMEN PELVIS W CONTRAST   Lipase, blood   Comprehensive metabolic panel   CBC   Urinalysis, Routine w reflex microscopic -Urine, Clean Catch   Diet NPO time specified   Consult to hospitalist  [phone]     OTHER Significant  initial  Findings:  labs showing:     DM  labs:  HbA1C: No results for input(s): "HGBA1C" in the last 8760 hours.     CBG (last 3)  No results for input(s): "GLUCAP" in the last 72 hours.        Cultures:    Component Value Date/Time   SDES URINE, CLEAN CATCH 04/21/2021 0623   SPECREQUEST NONE 04/21/2021 0623   CULT  04/21/2021 0623    NO GROWTH Performed at [facility], [address] [place], [place] [address]    REPTSTATUS 04/23/2021 FINAL 04/21/2021 3664     Radiological Exams on Admission: CT ABDOMEN PELVIS W CONTRAST  Result Date: 03/23/2023 CLINICAL DATA:  Abdominal pain, elevated lipase EXAM: CT ABDOMEN AND PELVIS WITH CONTRAST TECHNIQUE: Multidetector CT imaging of the abdomen and pelvis was performed using the standard protocol following bolus administration of intravenous contrast. RADIATION DOSE REDUCTION: This exam was performed according to the departmental dose-optimization program which includes automated exposure control, adjustment of the mA and/or kV according to patient size and/or use of iterative reconstruction technique. CONTRAST:  75mL OMNIPAQUE IOHEXOL 350 MG/ML  SOLN COMPARISON:  10/14/2022 FINDINGS: Lower chest: Lung bases are clear. Hepatobiliary: Liver is within normal limits. Status post cholecystectomy. No intrahepatic or extrahepatic dilatation. Pancreas: Peripancreatic inflammatory changes, most prominent along the pancreatic head and uncinate process. Additional parenchymal calcifications in the posterior aspect of the pancreatic head. No main pancreatic dilatation. No well-defined fluid collection/pseudocyst. Spleen: Within normal limits. Adrenals/Urinary Tract: Adrenal glands are within normal limits. Severe right renal cortical scarring/atrophy, unchanged. Associated 17 mm simple cyst (series 3/image 31), benign (Bosniak I). 10 mm nonobstructing left lower pole renal calculus (series 3/image 44), similar. No ureteral or bladder calculi. No  hydronephrosis. Bladder is within normal limits. Stomach/Bowel: Stomach is within normal limits. Secondary inflammatory changes involving the duodenum. No evidence of bowel obstruction. Normal appendix (series 3/image 62). No colonic wall thickening or inflammatory changes. Vascular/Lymphatic: No evidence of abdominal aortic aneurysm. No suspicious abdominopelvic lymphadenopathy. Reproductive: Prostate is unremarkable. Other: No abdominopelvic ascites. Musculoskeletal: Mild degenerative changes at L5-S1. IMPRESSION: Acute on chronic pancreatitis, as above. No well-defined fluid collection/pseudocyst. Secondary inflammatory changes involving the duodenum. 10 mm nonobstructing left lower pole renal calculus. No hydronephrosis. Electronically Signed   By: Charline Bills M.D.   On: 03/23/2023 18:57   _______________________________________________________________________________________________________ Latest  Blood pressure (!) 181/105, pulse 97, temperature 98.8 F (37.1 C), temperature source Oral, resp. rate 20, height 6\' 1"  (1.854 m), weight 73.2 kg, SpO2 100%.   Vitals  labs and radiology finding personally reviewed  Review of Systems:    Pertinent positives include:   abdominal pain, nausea, vomiting, diarrhea, Constitutional:  No weight loss, night sweats, Fevers, chills, fatigue, weight loss  HEENT:  No headaches, Difficulty swallowing,Tooth/dental problems,Sore throat,  No sneezing, itching, ear ache, nasal congestion, post nasal drip,  Cardio-vascular:  No chest pain, Orthopnea, PND, anasarca, dizziness, palpitations.no Bilateral lower extremity swelling  GI:  No heartburn, indigestion,  change in bowel habits, loss of appetite, melena, blood in stool, hematemesis Resp:  no shortness of breath at rest. No dyspnea on exertion, No excess mucus, no productive cough, No non-productive cough, No coughing up of blood.No change in color of mucus.No wheezing. Skin:  no rash or lesions. No  jaundice GU:  no dysuria, change in color of urine, no urgency or frequency. No straining to urinate.  No flank pain.  Musculoskeletal:  No joint pain or no joint swelling. No decreased range of motion. No back pain.  Psych:  No change in mood or affect. No depression or anxiety. No memory loss.  Neuro: no localizing neurological complaints, no tingling, no weakness, no double vision, no gait abnormality, no slurred speech, no confusion  All systems reviewed and apart from HOPI all are negative _______________________________________________________________________________________________ Past Medical History:   Past Medical History:  Diagnosis Date   Atrophy of right kidney    Chronic bronchitis (HCC)    Chronic lower back pain    Chronic pancreatitis (HCC)    Gastroparesis 06/2020   confirmed by emptying [facility] at [facility]   GERD (gastroesophageal reflux disease)    Headache    "once/month" (05/13/2017)   Hematemesis 10/18/2017   Hematochezia 09/19/2021   High cholesterol    Hypertension    Migraine    "a couple/year" (05/13/2017)   Nephrolithiasis 05/13/2017   Pancreatitis    Pneumonia ~ 09/2015   Presence of pancreatic duct stent    Recurrent acute pancreatitis    Shortness of breath 05/22/2021      Past Surgical History:  Procedure Laterality Date   BIOPSY  11/02/2020  Procedure: BIOPSY;  Surgeon: [name]., MD;  Location: [facility] ENDOSCOPY;  Service: Gastroenterology;;   BIOPSY  10/20/2022   Procedure: BIOPSY;  Surgeon: [name]., MD;  Location: [facility] ENDOSCOPY;  Service: Gastroenterology;;   CHOLECYSTECTOMY N/A 10/23/2017   Procedure: LAPAROSCOPIC CHOLECYSTECTOMY WITH INTRAOPERATIVE CHOLANGIOGRAM;  Surgeon: Luretha Murphy, MD;  Location: [facility] ORS;  Service: General;  Laterality: N/A;   COLONOSCOPY WITH PROPOFOL N/A 11/02/2019   Procedure: COLONOSCOPY WITH PROPOFOL;  Surgeon: Hilarie Fredrickson, MD;  Location: [facility] ENDOSCOPY;  Service: Endoscopy;  Laterality:  N/A;   ERCP N/A 11/02/2020   Procedure: ENDOSCOPIC RETROGRADE CHOLANGIOPANCREATOGRAPHY (ERCP);  Surgeon: [name]., MD;  Location[facility] ENDOSCOPY;  Service: Gastroenterology;  Laterality: N/A;   ESOPHAGOGASTRODUODENOSCOPY N/A 11/02/2020   Procedure: ESOPHAGOGASTRODUODENOSCOPY (EGD);  Surgeon: [name]., MD;  Location: [facility] ENDOSCOPY;  Service: Gastroenterology;  Laterality: N/A;   ESOPHAGOGASTRODUODENOSCOPY (EGD) WITH PROPOFOL N/A 11/06/2019   Procedure: ESOPHAGOGASTRODUODENOSCOPY (EGD) WITH PROPOFOL;  Surgeon: Hilarie Fredrickson, MD;  Location: [facility] ENDOSCOPY;  Service: Endoscopy;  Laterality: N/A;   ESOPHAGOGASTRODUODENOSCOPY (EGD) WITH PROPOFOL N/A 10/20/2022   Procedure: ESOPHAGOGASTRODUODENOSCOPY (EGD) WITH PROPOFOL;  Surgeon: Meridee Score Netty Starring., MD;  Location: [facility] ENDOSCOPY;  Service: Gastroenterology;  Laterality: N/A;   HEMOSTASIS CLIP PLACEMENT  11/02/2019   Procedure: HEMOSTASIS CLIP PLACEMENT;  Surgeon: Hilarie Fredrickson, MD;  Location: [facility] ENDOSCOPY;  Service: Endoscopy;;   NEUROLYTIC CELIAC PLEXUS  10/20/2022   Procedure: NEUROLYTIC CELIAC PLEXUS;  Surgeon: [name]., MD;  Location: [facility] ENDOSCOPY;  Service: Gastroenterology;;   NO PAST SURGERIES     REMOVAL OF STONES  11/02/2020   Procedure: REMOVAL OF STONES;  Surgeon: [name]., MD;  Location: [facility] ENDOSCOPY;  Service: Gastroenterology;;   Francine Graven REMOVAL  11/02/2020   Procedure: STENT REMOVAL;  Surgeon: [name]., MD;  Location: [facility] ENDOSCOPY;  Service: Gastroenterology;;   UPPER ESOPHAGEAL ENDOSCOPIC ULTRASOUND (EUS) N/A 10/20/2022   Procedure: UPPER ESOPHAGEAL ENDOSCOPIC ULTRASOUND (EUS);  Surgeon: [name]., MD;  Location: [facility] ENDOSCOPY;  Service: Gastroenterology;  Laterality: N/A;    Social History:  Ambulatory   independently       reports that he has never smoked. He has never used smokeless tobacco. He reports that he does not currently use alcohol. He reports  that he does not use drugs.     Family History:    Family History  Problem Relation Age of Onset   Hypertension Other    Hypertension Mother    Hypertension Father    Kidney disease Father    Hypertension Sister    Diabetes Sister    Hypertension Brother    Pancreatic cancer Paternal Grandmother    Colon cancer Cousin    Stomach cancer Neg Hx    Esophageal cancer Neg Hx    ______________________________________________________________________________________________ Allergies: Allergies  Allergen Reactions   Norvasc [Amlodipine Besylate] Other (See Comments)    Fluid buildup in chest   Voltaren [Diclofenac] Hives and Other (See Comments)    Fluid buildup in chest   Prilosec [Omeprazole] Other (See Comments)    MD stopped due to pancreatitis   Hydrochlorothiazide Itching   Ultram [Tramadol] Other (See Comments)    Stomach upset   Prednisone Hives and Other (See Comments)    Mood swings    Prior to Admission medications   Medication Sig Start Date End Date Taking? Authorizing Provider  acetaminophen (TYLENOL) 500 MG tablet Take 1,000 mg by mouth 2 (two) times daily as needed for moderate pain,  fever or headache.    [provider]  albuterol (VENTOLIN HFA) 108 (90 Base) MCG/ACT inhaler Inhale 2 puffs into the lungs every 6 (six) hours as needed for wheezing or shortness of breath.    [provider]  carvedilol (COREG) 3.125 MG tablet Take 1 tablet (3.125 mg total) by mouth 2 (two) times daily with a meal. 10/23/22   Rodolph Bong, MD  citalopram (CELEXA) 40 MG tablet Take 40 mg by mouth daily.    [provider]  famotidine (PEPCID) 20 MG tablet Take 1 tablet (20 mg total) by mouth 2 (two) times daily for 30 days, THEN 1 tablet (20 mg total) daily. Patient taking differently: 20 mg once daily 10/23/22 11/22/23  Rodolph Bong, MD  gabapentin (NEURONTIN) 300 MG capsule Take 300 mg by mouth 3 (three) times daily.    [provider]   HYDROcodone-acetaminophen (NORCO/VICODIN) 5-325 MG tablet Take 1 tablet by mouth every 6 (six) hours as needed for moderate pain or severe pain.    [provider]  lipase/protease/amylase (CREON) 36000 UNITS CPEP capsule Take 1 capsule (36,000 Units total) by mouth 3 (three) times daily between meals as needed (with snacks). Patient taking differently: Take 36,000-72,000 Units by mouth See admin instructions. Take 2 capsules (72,000 units) three times daily with meals and 1 capsule (36,000 units) as needed up to 3 times daily with snacks 10/23/22   Rodolph Bong, MD  naloxone [facility]) nasal spray 4 mg/0.1 mL Place 1 spray into the nose daily as needed (opioid reversal). 07/16/22   [provider]  Naphazoline-Pheniramine (VISINE OP) Place 1 drop into both eyes daily.    [provider]  Nutritional Supplements (ENSURE ORIGINAL) LIQD Take 1 Container by mouth 3 (three) times daily.    [provider]  promethazine (PHENERGAN) 25 MG tablet Take 1 tablet (25 mg total) by mouth every 6 (six) hours as needed for nausea or vomiting. 10/23/22   Rodolph Bong, MD  traMADol (ULTRAM) 50 MG tablet Take 50 mg by mouth every 6 (six) hours as needed for moderate pain.    [provider]    ___________________________________________________________________________________________________ Physical Exam:    03/23/2023    8:30 PM 03/23/2023    4:57 PM 03/23/2023    4:56 PM  Vitals with BMI  Height  6\' 1"    Weight  161 lbs 6 oz   BMI  21.3   Systolic 181  162  Diastolic 105  113  Pulse 97  90    1. General:  in No  Acute distress    Chronically ill   -appearing 2. Psychological: Alert and   Oriented 3. Head/ENT:    Dry Mucous Membranes                          Head Non traumatic, neck supple                      Poor Dentition 4. SKIN: decreased Skin turgor,  Skin clean Dry and intact no rash    5. Heart: Regular rate and rhythm no  Murmur, no Rub or  gallop 6. Lungs: no wheezes or crackles   7. Abdomen: Soft,epigastric -tenderness with palpation not auscultation, Non distended  bowel sounds present 8. Lower extremities: no clubbing, cyanosis, no  edema 9. Neurologically Grossly intact, moving all 4 extremities equally  10. MSK: Normal range of motion  Chart has been reviewed  ______________________________________________________________________________________________  Assessment/Plan  52 y.o. male with medical history significant of Chronic pancreatitis, CKD, HTN, Congenital single kidney   Admitted for   Acute pancreatitis, unspecified complication status, unspecified pancreatitis type     Present on Admission:  Pancreatitis  Acute on chronic pancreatitis (HCC)  Essential hypertension    Acute on chronic pancreatitis (HCC) most likely cause being alcohol induced,   CT of abdomen showing acute pancreatitis  no evidence of pseudocyst Lipase     Component Value Date/Time   LIPASE 290 (H) 03/23/2023 1708   Lipid Panel     Component Value Date/Time   CHOL 190 05/07/2022 0352   TRIG 133 09/24/2022 1130   HDL 84 05/07/2022 0352   CHOLHDL 2.3 05/07/2022 0352   VLDL 15 05/07/2022 0352   LDLCALC 91 05/07/2022 0352    Will rehydrate with aggressive IV fluids Keep n.p.o. Follow clinically  -  strongly advised patient to stop drinking alcohol    Control pain with IV pain medications If no significant improvement in the next 24 hours may benefit from GI consult     Essential hypertension Restart coreg 3.125 mg po BID  Congenital single kidney Chronic stable  Kidney stone Chronic non obstructive asymptomatic   Other plan as per orders.  DVT prophylaxis:  SCD      Code Status:    Code Status: Prior FULL CODE  as per patient   I had personally discussed CODE STATUS with patient  ACP   none    Family Communication:   Family not at  Bedside    Diet  Diet Orders (From admission, onward)     Start      Ordered   03/23/23 1701  Diet NPO time specified  Diet effective now        03/23/23 1701            Disposition Plan:      To home once workup is complete and patient is stable   Following barriers for discharge:                                                       Electrolytes corrected                              Consult Orders  (From admission, onward)           Start     Ordered   03/23/23 2017  Consult to hospitalist  [phone] Paged by Annice Pih  Once       Comments: [phone]  Provider:  (Not yet assigned)  Question Answer Comment  Place call to: [facility]   Reason for Consult Admit      03/23/23 2016               Consults called: none     Admission status:  ED Disposition     ED Disposition  Admit   Condition  --   Comment  Hospital Area: [facility] [[id number]]  Level of Care: Telemetry Medical [[id number]]  May admit patient to [facility] or [facility] if equivalent level of care is available:: No  Covid Evaluation: Asymptomatic - no recent exposure (last 10 days) testing not required  Diagnosis: Pancreatitis [161096]  Admitting Physician: Therisa Doyne [[id number]]  Attending Physician: Therisa Doyne [[id number]]  Certification:: I certify this patient will need inpatient services for at least 2 midnights  Expected Medical Readiness: 03/25/2023            inpatient     I Expect 2 midnight stay secondary to severity of patient's current illness need for inpatient interventions justified by the following:    Severe lab/radiological/exam abnormalities including:    Acute on chronic pancreatitis     That are currently affecting medical management.   I expect  patient to be hospitalized for 2 midnights requiring inpatient medical care.  Patient is at high risk for adverse outcome (such as loss of life or disability) if not treated.  Indication for inpatient stay as follows:     Need for IV fluids, IV pain medications,    Level of care     tele  For 12H      Chenoah Mcnally 03/23/2023, 9:55 PM    [facility]     after 2 AM please page floor coverage PA If 7AM-7PM, please contact the day team taking care of the patient using [email or web address]

## 2023-03-23 NOTE — Assessment & Plan Note (Signed)
Chronic-stable.

## 2023-03-23 NOTE — Assessment & Plan Note (Signed)
most likely cause being alcohol induced,   CT of abdomen showing acute pancreatitis  no evidence of pseudocyst Lipase     Component Value Date/Time   LIPASE 290 (H) 03/23/2023 1708   Lipid Panel     Component Value Date/Time   CHOL 190 05/07/2022 0352   TRIG 133 09/24/2022 1130   HDL 84 05/07/2022 0352   CHOLHDL 2.3 05/07/2022 0352   VLDL 15 05/07/2022 0352   LDLCALC 91 05/07/2022 0352    Will rehydrate with aggressive IV fluids Keep n.p.o. Follow clinically  -  strongly advised patient to stop drinking alcohol    Control pain with IV pain medications If no significant improvement in the next 24 hours may benefit from GI consult

## 2023-03-23 NOTE — ED Triage Notes (Signed)
The pt is c/o abd pain n and v for 2 days hx of non-alcoholic pancreatitis

## 2023-03-23 NOTE — Subjective & Objective (Signed)
Presents with abd pain similar to prior chronic pancreatitis  Decreased ETOH but still uses Last admit for the same in September

## 2023-03-23 NOTE — ED Provider Notes (Signed)
[place] EMERGENCY DEPARTMENT AT [facility] Provider Note   CSN: [id number] Arrival date & time: 03/23/23  1646     History  Chief Complaint  Patient presents with   Abdominal Pain    Kyle Berry is a 52 y.o. male with a past medical history of chronic pancreatitis, alcohol use, pancreatic insufficiency, solitary kidney, HTN, HLD presents to emergency department for evaluation of upper stabbing and sharp abdominal pain that radiates to back and intractable vomiting for the past 2 days. He reports this feels similar to pancreatitis flares in past. He states he had a "sip" of beer two nights ago and air fryed food last evening when pain significantly worsened. He denies fevers at home.  Abdominal Pain Associated symptoms: no chest pain, no chills, no constipation, no cough, no diarrhea, no fatigue, no fever, no nausea, no shortness of breath and no vomiting      Home Medications Prior to Admission medications   Medication Sig Start Date End Date Taking? Authorizing Provider  acetaminophen (TYLENOL) 500 MG tablet Take 1,000 mg by mouth 2 (two) times daily as needed for moderate pain, fever or headache.    [provider]  albuterol (VENTOLIN HFA) 108 (90 Base) MCG/ACT inhaler Inhale 2 puffs into the lungs every 6 (six) hours as needed for wheezing or shortness of breath.    [provider]  carvedilol (COREG) 3.125 MG tablet Take 1 tablet (3.125 mg total) by mouth 2 (two) times daily with a meal. 10/23/22   Rodolph Bong, MD  citalopram (CELEXA) 40 MG tablet Take 40 mg by mouth daily.    [provider]  famotidine (PEPCID) 20 MG tablet Take 1 tablet (20 mg total) by mouth 2 (two) times daily for 30 days, THEN 1 tablet (20 mg total) daily. Patient taking differently: 20 mg once daily 10/23/22 11/22/23  Rodolph Bong, MD  gabapentin (NEURONTIN) 300 MG capsule Take 300 mg by mouth 3 (three) times daily.    [provider]   HYDROcodone-acetaminophen (NORCO/VICODIN) 5-325 MG tablet Take 1 tablet by mouth every 6 (six) hours as needed for moderate pain or severe pain.    [provider]  lipase/protease/amylase (CREON) 36000 UNITS CPEP capsule Take 1 capsule (36,000 Units total) by mouth 3 (three) times daily between meals as needed (with snacks). Patient taking differently: Take 36,000-72,000 Units by mouth See admin instructions. Take 2 capsules (72,000 units) three times daily with meals and 1 capsule (36,000 units) as needed up to 3 times daily with snacks 10/23/22   Rodolph Bong, MD  naloxone [facility]) nasal spray 4 mg/0.1 mL Place 1 spray into the nose daily as needed (opioid reversal). 07/16/22   [provider]  Naphazoline-Pheniramine (VISINE OP) Place 1 drop into both eyes daily.    [provider]  Nutritional Supplements (ENSURE ORIGINAL) LIQD Take 1 Container by mouth 3 (three) times daily.    [provider]  promethazine (PHENERGAN) 25 MG tablet Take 1 tablet (25 mg total) by mouth every 6 (six) hours as needed for nausea or vomiting. 10/23/22   Rodolph Bong, MD  traMADol (ULTRAM) 50 MG tablet Take 50 mg by mouth every 6 (six) hours as needed for moderate pain.    [provider]      Allergies    Norvasc [amlodipine besylate], Voltaren [diclofenac], Prilosec [omeprazole], Hydrochlorothiazide, Ultram [tramadol], and Prednisone    Review of Systems   Review of Systems  Constitutional:  Negative  for chills, fatigue and fever.  Respiratory:  Negative for cough, chest tightness, shortness of breath and wheezing.   Cardiovascular:  Negative for chest pain and palpitations.  Gastrointestinal:  Positive for abdominal pain. Negative for constipation, diarrhea, nausea and vomiting.  Neurological:  Negative for dizziness, seizures, weakness, light-headedness, numbness and headaches.    Physical Exam Updated Vital Signs BP (!) 162/113   Pulse 90   Temp 99.1  F (37.3 C)   Resp (!) 22   Ht 6\' 1"  (1.854 m)   Wt 73.2 kg   SpO2 99%   BMI 21.29 kg/m  Physical Exam Vitals and nursing note reviewed.  Constitutional:      General: He is in acute distress (2/2 pain and inability to get comfortable).     Appearance: Normal appearance. He is not ill-appearing.  HENT:     Head: Normocephalic and atraumatic.  Eyes:     Extraocular Movements: Extraocular movements intact.     Conjunctiva/sclera: Conjunctivae normal.     Pupils: Pupils are equal, round, and reactive to light.  Cardiovascular:     Rate and Rhythm: Normal rate.  Pulmonary:     Effort: Pulmonary effort is normal. No respiratory distress.     Breath sounds: Normal breath sounds.  Chest:     Chest wall: No tenderness.  Abdominal:     Tenderness: There is generalized abdominal tenderness. There is guarding. There is no right CVA tenderness, left CVA tenderness or rebound. Negative signs include Murphy's sign, Rovsing's sign and McBurney's sign.  Musculoskeletal:     Cervical back: Normal range of motion and neck supple. No rigidity.  Skin:    General: Skin is warm.     Capillary Refill: Capillary refill takes less than 2 seconds.     Coloration: Skin is not jaundiced or pale.     Findings: No erythema or rash.  Neurological:     Mental Status: He is alert and oriented to person, place, and time. Mental status is at baseline.     Motor: No weakness.     Gait: Gait normal.   ED Results / Procedures / Treatments   Labs (all labs ordered are listed, but only abnormal results are displayed) Labs Reviewed  LIPASE, BLOOD - Abnormal; Notable for the following components:      Result Value   Lipase 290 (*)    All other components within normal limits  COMPREHENSIVE METABOLIC PANEL - Abnormal; Notable for the following components:   Glucose, Bld 133 (*)    Total Bilirubin 1.9 (*)    All other components within normal limits  CBC - Abnormal; Notable for the following components:    RBC 3.84 (*)    MCV 105.5 (*)    MCH 36.5 (*)    RDW 16.4 (*)    All other components within normal limits  URINALYSIS, ROUTINE W REFLEX MICROSCOPIC    EKG None  Radiology CT ABDOMEN PELVIS W CONTRAST  Result Date: 03/23/2023 CLINICAL DATA:  Abdominal pain, elevated lipase EXAM: CT ABDOMEN AND PELVIS WITH CONTRAST TECHNIQUE: Multidetector CT imaging of the abdomen and pelvis was performed using the standard protocol following bolus administration of intravenous contrast. RADIATION DOSE REDUCTION: This exam was performed according to the departmental dose-optimization program which includes automated exposure control, adjustment of the mA and/or kV according to patient size and/or use of iterative reconstruction technique. CONTRAST:  75mL OMNIPAQUE IOHEXOL 350 MG/ML SOLN COMPARISON:  10/14/2022 FINDINGS: Lower chest: Lung bases are clear. Hepatobiliary: Liver  is within normal limits. Status post cholecystectomy. No intrahepatic or extrahepatic dilatation. Pancreas: Peripancreatic inflammatory changes, most prominent along the pancreatic head and uncinate process. Additional parenchymal calcifications in the posterior aspect of the pancreatic head. No main pancreatic dilatation. No well-defined fluid collection/pseudocyst. Spleen: Within normal limits. Adrenals/Urinary Tract: Adrenal glands are within normal limits. Severe right renal cortical scarring/atrophy, unchanged. Associated 17 mm simple cyst (series 3/image 31), benign (Bosniak I). 10 mm nonobstructing left lower pole renal calculus (series 3/image 44), similar. No ureteral or bladder calculi. No hydronephrosis. Bladder is within normal limits. Stomach/Bowel: Stomach is within normal limits. Secondary inflammatory changes involving the duodenum. No evidence of bowel obstruction. Normal appendix (series 3/image 62). No colonic wall thickening or inflammatory changes. Vascular/Lymphatic: No evidence of abdominal aortic aneurysm. No suspicious  abdominopelvic lymphadenopathy. Reproductive: Prostate is unremarkable. Other: No abdominopelvic ascites. Musculoskeletal: Mild degenerative changes at L5-S1. IMPRESSION: Acute on chronic pancreatitis, as above. No well-defined fluid collection/pseudocyst. Secondary inflammatory changes involving the duodenum. 10 mm nonobstructing left lower pole renal calculus. No hydronephrosis. Electronically Signed   By: Charline Bills M.D.   On: 03/23/2023 18:57    Procedures Procedures    Medications Ordered in ED Medications  sodium chloride 0.9 % bolus 1,000 mL (1,000 mLs Intravenous New Bag/Given 03/23/23 1833)  ondansetron (ZOFRAN) injection 4 mg (4 mg Intravenous Given 03/23/23 1834)  HYDROmorphone (DILAUDID) injection 1 mg (1 mg Intravenous Given 03/23/23 1834)  iohexol (OMNIPAQUE) 350 MG/ML injection 75 mL (75 mLs Intravenous Contrast Given 03/23/23 1850)  HYDROmorphone (DILAUDID) injection 1 mg (1 mg Intravenous Given 03/23/23 1949)    ED Course/ Medical Decision Making/ A&P                                 Medical Decision Making Amount and/or Complexity of Data Reviewed Radiology: ordered.  Risk Prescription drug management.   Patient presents to the ED for concern of abdominal pain and intractable vomiting, this involves an extensive number of treatment options, and is a complaint that carries with it a high risk of complications and morbidity.  The differential diagnosis includes acute on chronic pancreatitis, ileus, obstruction, cholecystitis. Not exhaustive list   Co morbidities that complicate the patient evaluation  chronic pancreatitis, alcohol use, pancreatic insufficiency, solitary kidney, HTN, HLD    Additional history obtained:  Additional history obtained from Nursing, Outside Medical Records, and Past Admission   External records from outside source obtained and reviewed including admission for pancreatitis on 01/12/2023, 10/12/2022, 09/24/2022, 05/04/2022, 03/05/2022,  01/23/2022. Multiple ED admissions for pancreatitis since 2017   Lab Tests:  I Ordered, and personally interpreted labs.  The pertinent results include:  elevated bili 1.9 Lipase 290   Imaging Studies ordered:  I ordered imaging studies including CT abd pelvis  I independently visualized and interpreted imaging which showed  Acute on chronic pancreatitis. No well-defined fluid collection/pseudocyst.  Secondary inflammatory changes involving the duodenum. 10 mm nonobstructing left lower pole renal calculus. No hydronephrosis I agree with the radiologist interpretation    Medicines ordered and prescription drug management:  I ordered medication including dilaudid, NS, zofran for abd pain, nausea  Reevaluation of the patient after these medicines showed that the patient improved I have reviewed the patients home medicines and have made adjustments as needed   Consultations Obtained:  I requested consultation with the hospitalist,  and discussed lab and imaging findings as well as pertinent plan - Dr. Therisa Doyne  accepts patient for admission     Problem List / ED Course:  Pancreatitis  Extremely uncomfortable appearing upon initial exam. TTP of RUQ and LUQ Lipase 290 Will provide dilaudid, NS, and zofran for analgesia, nausea CT significant for acute on chronic pancreatitis. No well-defined fluid collection/pseudocyst Upon reassessment patient reports improvement to 7/10 abdominal pain and resolution of nausea - will provide additional dilaudid. Likely patient will need admission for intractable pain and IVF Following dilaudid, he reports continued pain with no improvement   Reevaluation:  After the interventions noted above, I reevaluated the patient and found that they have :improved   Dispostion:  After consideration of the diagnostic results and the patients response to treatment, I feel that the patent would benefit from admission for intractable pain.    Final Clinical Impression(s) / ED Diagnoses Final diagnoses:  Acute pancreatitis, unspecified complication status, unspecified pancreatitis type   Rx / DC Orders ED Discharge Orders     None        Judithann Sheen, PA 03/23/23 2112    Gloris Manchester, MD 03/24/23 2122

## 2023-03-23 NOTE — Assessment & Plan Note (Signed)
Chronic non obstructive asymptomatic

## 2023-03-24 ENCOUNTER — Encounter (HOSPITAL_COMMUNITY): Payer: Self-pay | Admitting: Internal Medicine

## 2023-03-24 DIAGNOSIS — K861 Other chronic pancreatitis: Secondary | ICD-10-CM

## 2023-03-24 DIAGNOSIS — K859 Acute pancreatitis without necrosis or infection, unspecified: Secondary | ICD-10-CM | POA: Diagnosis not present

## 2023-03-24 LAB — COMPREHENSIVE METABOLIC PANEL
ALT: 21 U/L (ref 0–44)
AST: 28 U/L (ref 15–41)
Albumin: 3.7 g/dL (ref 3.5–5.0)
Alkaline Phosphatase: 65 U/L (ref 38–126)
Anion gap: 16 — ABNORMAL HIGH (ref 5–15)
BUN: 6 mg/dL (ref 6–20)
CO2: 22 mmol/L (ref 22–32)
Calcium: 8.6 mg/dL — ABNORMAL LOW (ref 8.9–10.3)
Chloride: 105 mmol/L (ref 98–111)
Creatinine, Ser: 1.22 mg/dL (ref 0.61–1.24)
GFR, Estimated: >60 mL/min (ref >60)
Glucose, Bld: 143 mg/dL — ABNORMAL HIGH (ref 70–99)
Potassium: 3.3 mmol/L — ABNORMAL LOW (ref 3.5–5.1)
Sodium: 143 mmol/L (ref 135–145)
Total Bilirubin: 1.6 mg/dL — ABNORMAL HIGH (ref <1.2)
Total Protein: 7 g/dL (ref 6.5–8.1)

## 2023-03-24 LAB — MAGNESIUM
Magnesium: 1.5 mg/dL — ABNORMAL LOW (ref 1.7–2.4)
Magnesium: 1.5 mg/dL — ABNORMAL LOW (ref 1.7–2.4)

## 2023-03-24 LAB — CBC
HCT: 40 % (ref 39.0–52.0)
Hemoglobin: 13.9 g/dL (ref 13.0–17.0)
MCH: 36.8 pg — ABNORMAL HIGH (ref 26.0–34.0)
MCHC: 34.8 g/dL (ref 30.0–36.0)
MCV: 105.8 fL — ABNORMAL HIGH (ref 80.0–100.0)
Platelets: 182 10*3/uL (ref 150–400)
RBC: 3.78 MIL/uL — ABNORMAL LOW (ref 4.22–5.81)
RDW: 16.5 % — ABNORMAL HIGH (ref 11.5–15.5)
WBC: 10.2 10*3/uL (ref 4.0–10.5)
nRBC: 0 % (ref 0.0–0.2)

## 2023-03-24 LAB — CK: Total CK: 60 U/L (ref 49–397)

## 2023-03-24 LAB — PHOSPHORUS
Phosphorus: 4 mg/dL (ref 2.5–4.6)
Phosphorus: 3.9 mg/dL (ref 2.5–4.6)

## 2023-03-24 LAB — ETHANOL: Alcohol, Ethyl (B): <10 mg/dL (ref <10)

## 2023-03-24 MED ORDER — KCL IN DEXTROSE-NACL 20-5-0.45 MEQ/L-%-% IV SOLN
INTRAVENOUS | Status: DC
  Filled 2023-03-24 (×3): qty 1000

## 2023-03-24 MED ORDER — MAGNESIUM SULFATE 4 GM/100ML IV SOLN
4.0000 g | Freq: Once | INTRAVENOUS | Status: AC
  Administered 2023-03-24: 4 g via INTRAVENOUS
  Filled 2023-03-24: qty 100

## 2023-03-24 NOTE — Progress Notes (Signed)
PROGRESS NOTE    Kyle Berry  ZOX:[id number] DOB: [birth year] DOA: 03/23/2023 PCP: [facility]    Brief Narrative:  52 year old with history of chronic pancreatitis, multiple previous admissions.  Ongoing use of alcohol, latest admission 5 weeks ago, has history of hypertension presented with diffuse abdominal pain similar to prior pancreatitis episodes, unable to eat or drink.  In the emergency room hemodynamically stable.  On room air.  Lipase 290.  CT scan with peripancreatic inflammatory changes most prominent along the pancreatic head and uncinate process.  Subjective: Patient seen and examined.  Complains of pain.  He wants to make sure his Dilaudid is every 3 hours.  Cannot take anything by mouth. Assessment & Plan:    Acute on chronic pancreatitis, alcohol induced.  Uncomplicated but with severe pain. N.p.o., IV fluids, adequate oral and IV opiates for pain relief.  Correction of electrolytes. Continue pancrelipase.  Continue gabapentin.  Hypokalemia/hypomagnesemia: Replaced IV.  Monitor levels.  Essential hypertension: Blood pressure stable on Coreg.  Smoker: Counseled to quit.  Nicotine patch provided.  Alcohol use: Patient tells me he is not drinking anymore.  Reported still use of alcohol.  Counseled.   DVT prophylaxis: SCDs Start: 03/23/23 2142   Code Status: Full code Family Communication: None at the bedside Disposition Plan: Status is: Inpatient Remains inpatient appropriate because: IV fluids, IV pain medications,     Consultants:  None  Procedures:  None  Antimicrobials:  None     Objective: Vitals:   03/24/23 0030 03/24/23 0121 03/24/23 0450 03/24/23 0806  BP: (!) 177/97 (!) 177/101 (!) 180/95 (!) 179/100  Pulse: 66 71 66 74  Resp: 15  18 18   Temp: 98.7 F (37.1 C) 98.5 F (36.9 C) 98 F (36.7 C) (!) 97.3 F (36.3 C)  TempSrc: Oral Oral Oral Oral  SpO2: 95% 97% 99% 97%  Weight:   69.7 kg   Height:         Intake/Output Summary (Last 24 hours) at 03/24/2023 1052 Last data filed at 03/24/2023 0838 Gross per 24 hour  Intake --  Output 100 ml  Net -100 ml   Filed Weights   03/23/23 1657 03/24/23 0450  Weight: 73.2 kg 69.7 kg    Examination:  General exam: Appears calm and comfortable on approaching the patient. Complaints of being in pain. Respiratory system: Clear to auscultation. Respiratory effort normal. Cardiovascular system: S1 & S2 heard, RRR.  Gastrointestinal system: Abdomen is nondistended, soft. Subjective tenderness present. Normal bowel sounds heard. Central nervous system: Alert and oriented. No focal neurological deficits.    Data Reviewed: I have personally reviewed following labs and imaging studies  CBC: Recent Labs  Lab 03/23/23 1708 03/24/23 0718  WBC 8.5 10.2  HGB 14.0 13.9  HCT 40.5 40.0  MCV 105.5* 105.8*  PLT 199 182   Basic Metabolic Panel: Recent Labs  Lab 03/23/23 1708 03/24/23 0718 03/24/23 0719  NA 139 143  --   K 3.8 3.3*  --   CL 104 105  --   CO2 22 22  --   GLUCOSE 133* 143*  --   BUN 7 6  --   CREATININE 1.17 1.22  --   CALCIUM 8.9 8.6*  --   MG  --  1.5* 1.5*  PHOS  --  3.9 4.0   GFR: Estimated Creatinine Clearance: 69 mL/min (by C-G formula based on SCr of 1.22 mg/dL). Liver Function Tests: Recent Labs  Lab 03/23/23 1708 03/24/23 8119  AST 41 28  ALT 24 21  ALKPHOS 70 65  BILITOT 1.9* 1.6*  PROT 7.4 7.0  ALBUMIN 4.0 3.7   Recent Labs  Lab 03/23/23 1708  LIPASE 290*   No results for input(s): "AMMONIA" in the last 168 hours. Coagulation Profile: No results for input(s): "INR", "PROTIME" in the last 168 hours. Cardiac Enzymes: Recent Labs  Lab 03/24/23 0719  CKTOTAL 60   BNP (last 3 results) No results for input(s): "PROBNP" in the last 8760 hours. HbA1C: No results for input(s): "HGBA1C" in the last 72 hours. CBG: No results for input(s): "GLUCAP" in the last 168 hours. Lipid Profile: No results  for input(s): "CHOL", "HDL", "LDLCALC", "TRIG", "CHOLHDL", "LDLDIRECT" in the last 72 hours. Thyroid Function Tests: No results for input(s): "TSH", "T4TOTAL", "FREET4", "T3FREE", "THYROIDAB" in the last 72 hours. Anemia Panel: No results for input(s): "VITAMINB12", "FOLATE", "FERRITIN", "TIBC", "IRON", "RETICCTPCT" in the last 72 hours. Sepsis Labs: No results for input(s): "PROCALCITON", "LATICACIDVEN" in the last 168 hours.  No results found for this or any previous visit (from the past 240 hour(s)).       Radiology Studies: CT ABDOMEN PELVIS W CONTRAST  Result Date: 03/23/2023 CLINICAL DATA:  Abdominal pain, elevated lipase EXAM: CT ABDOMEN AND PELVIS WITH CONTRAST TECHNIQUE: Multidetector CT imaging of the abdomen and pelvis was performed using the standard protocol following bolus administration of intravenous contrast. RADIATION DOSE REDUCTION: This exam was performed according to the departmental dose-optimization program which includes automated exposure control, adjustment of the mA and/or kV according to patient size and/or use of iterative reconstruction technique. CONTRAST:  75mL OMNIPAQUE IOHEXOL 350 MG/ML SOLN COMPARISON:  10/14/2022 FINDINGS: Lower chest: Lung bases are clear. Hepatobiliary: Liver is within normal limits. Status post cholecystectomy. No intrahepatic or extrahepatic dilatation. Pancreas: Peripancreatic inflammatory changes, most prominent along the pancreatic head and uncinate process. Additional parenchymal calcifications in the posterior aspect of the pancreatic head. No main pancreatic dilatation. No well-defined fluid collection/pseudocyst. Spleen: Within normal limits. Adrenals/Urinary Tract: Adrenal glands are within normal limits. Severe right renal cortical scarring/atrophy, unchanged. Associated 17 mm simple cyst (series 3/image 31), benign (Bosniak I). 10 mm nonobstructing left lower pole renal calculus (series 3/image 44), similar. No ureteral or bladder  calculi. No hydronephrosis. Bladder is within normal limits. Stomach/Bowel: Stomach is within normal limits. Secondary inflammatory changes involving the duodenum. No evidence of bowel obstruction. Normal appendix (series 3/image 62). No colonic wall thickening or inflammatory changes. Vascular/Lymphatic: No evidence of abdominal aortic aneurysm. No suspicious abdominopelvic lymphadenopathy. Reproductive: Prostate is unremarkable. Other: No abdominopelvic ascites. Musculoskeletal: Mild degenerative changes at L5-S1. IMPRESSION: Acute on chronic pancreatitis, as above. No well-defined fluid collection/pseudocyst. Secondary inflammatory changes involving the duodenum. 10 mm nonobstructing left lower pole renal calculus. No hydronephrosis. Electronically Signed   By: Charline Bills M.D.   On: 03/23/2023 18:57        Scheduled Meds:  carvedilol  3.125 mg Oral BID WC   citalopram  40 mg Oral Daily   famotidine  20 mg Oral Daily   gabapentin  300 mg Oral TID   lipase/protease/amylase  36,000 Units Oral With snacks   lipase/protease/amylase  72,000 Units Oral TID WC   nicotine  7 mg Transdermal Daily   Continuous Infusions:  dextrose 5 % and 0.45 % NaCl with KCl 20 mEq/L     magnesium sulfate bolus IVPB       LOS: 1 day    Time spent: 40 minutes     Dorcas Carrow,  MD [facility]

## 2023-03-24 NOTE — Progress Notes (Signed)
Pt c/o N/V and severe pain 10/10. Too early for PRN nausea meds and PRN Dilaudid. Pt said he will throw anything up he ingests. Notified Dr. Jerral Ralph.

## 2023-03-24 NOTE — ED Notes (Signed)
ED TO INPATIENT HANDOFF REPORT  ED Nurse Name and Phone #: Alphonzo Lemmings, RN   S Name/Age/Gender Kyle Berry 52 y.o. male Room/Bed: 007C/007C  Code Status   Code Status: Full Code  Home/SNF/Other Home Patient oriented to: self, place, time, and situation Is this baseline? Yes   Triage Complete: Triage complete  Chief Complaint Pancreatitis [K85.90]  Triage Note The pt is c/o abd pain n and v for 2 days hx of non-alcoholic pancreatitis   Allergies Allergies  Allergen Reactions   Norvasc [Amlodipine Besylate] Other (See Comments)    Fluid buildup in chest   Voltaren [Diclofenac] Hives and Other (See Comments)    Fluid buildup in chest   Prilosec [Omeprazole] Other (See Comments)    MD stopped due to pancreatitis   Hydrochlorothiazide Itching   Ultram [Tramadol] Other (See Comments)    Stomach upset   Prednisone Hives and Other (See Comments)    Mood swings    Level of Care/Admitting Diagnosis ED Disposition     ED Disposition  Admit   Condition  --   Comment  Hospital Area: [facility] [[id number]]  Level of Care: Telemetry Medical [104]  May admit patient to [facility] or [facility] if equivalent level of care is available:: No  Covid Evaluation: Asymptomatic - no recent exposure (last 10 days) testing not required  Diagnosis: Pancreatitis [960454]  Admitting Physician: Therisa Doyne [[id number]]  Attending Physician: Therisa Doyne [[id number]]  Certification:: I certify this patient will need inpatient services for at least 2 midnights  Expected Medical Readiness: 03/25/2023          B Medical/Surgery History Past Medical History:  Diagnosis Date   Atrophy of right kidney    Chronic bronchitis (HCC)    Chronic lower back pain    Chronic pancreatitis (HCC)    Gastroparesis 06/2020   confirmed by emptying study at [facility]   GERD (gastroesophageal reflux disease)    Headache    "once/month" (05/13/2017)   Hematemesis 10/18/2017    Hematochezia 09/19/2021   High cholesterol    Hypertension    Migraine    "a couple/year" (05/13/2017)   Nephrolithiasis 05/13/2017   Pancreatitis    Pneumonia ~ 09/2015   Presence of pancreatic duct stent    Recurrent acute pancreatitis    Shortness of breath 05/22/2021   Past Surgical History:  Procedure Laterality Date   BIOPSY  11/02/2020   Procedure: BIOPSY;  Surgeon: [name]., MD;  Location: [facility] ENDOSCOPY;  Service: Gastroenterology;;   BIOPSY  10/20/2022   Procedure: BIOPSY;  Surgeon: [name]., MD;  Location: [facility] ENDOSCOPY;  Service: Gastroenterology;;   CHOLECYSTECTOMY N/A 10/23/2017   Procedure: LAPAROSCOPIC CHOLECYSTECTOMY WITH INTRAOPERATIVE CHOLANGIOGRAM;  Surgeon: Luretha Murphy, MD;  Location: [facility] ORS;  Service: General;  Laterality: N/A;   COLONOSCOPY WITH PROPOFOL N/A 11/02/2019   Procedure: COLONOSCOPY WITH PROPOFOL;  Surgeon: Hilarie Fredrickson, MD;  Location: [facility] ENDOSCOPY;  Service: Endoscopy;  Laterality: N/A;   ERCP N/A 11/02/2020   Procedure: ENDOSCOPIC RETROGRADE CHOLANGIOPANCREATOGRAPHY (ERCP);  Surgeon: [name]., MD;  Location: [facility] ENDOSCOPY;  Service: Gastroenterology;  Laterality: N/A;   ESOPHAGOGASTRODUODENOSCOPY N/A 11/02/2020   Procedure: ESOPHAGOGASTRODUODENOSCOPY (EGD);  Surgeon: [name]., MD;  Location: [facility] ENDOSCOPY;  Service: Gastroenterology;  Laterality: N/A;   ESOPHAGOGASTRODUODENOSCOPY (EGD) WITH PROPOFOL N/A 11/06/2019   Procedure: ESOPHAGOGASTRODUODENOSCOPY (EGD) WITH PROPOFOL;  Surgeon: Hilarie Fredrickson, MD;  Location: [facility] ENDOSCOPY;  Service: Endoscopy;  Laterality: N/A;   ESOPHAGOGASTRODUODENOSCOPY (EGD)  WITH PROPOFOL N/A 10/20/2022   Procedure: ESOPHAGOGASTRODUODENOSCOPY (EGD) WITH PROPOFOL;  Surgeon: Meridee Score Netty Starring., MD;  Location: [facility] ENDOSCOPY;  Service: Gastroenterology;  Laterality: N/A;   HEMOSTASIS CLIP PLACEMENT  11/02/2019   Procedure: HEMOSTASIS CLIP PLACEMENT;  Surgeon: Hilarie Fredrickson, MD;   Location: [facility] ENDOSCOPY;  Service: Endoscopy;;   NEUROLYTIC CELIAC PLEXUS  10/20/2022   Procedure: NEUROLYTIC CELIAC PLEXUS;  Surgeon: [name]., MD;  Location: [facility] ENDOSCOPY;  Service: Gastroenterology;;   NO PAST SURGERIES     REMOVAL OF STONES  11/02/2020   Procedure: REMOVAL OF STONES;  Surgeon: [name]., MD;  Location: [facility] ENDOSCOPY;  Service: Gastroenterology;;   Francine Graven REMOVAL  11/02/2020   Procedure: STENT REMOVAL;  Surgeon: [name]., MD;  Location: [facility] ENDOSCOPY;  Service: Gastroenterology;;   UPPER ESOPHAGEAL ENDOSCOPIC ULTRASOUND (EUS) N/A 10/20/2022   Procedure: UPPER ESOPHAGEAL ENDOSCOPIC ULTRASOUND (EUS);  Surgeon: [name]., MD;  Location: [facility] ENDOSCOPY;  Service: Gastroenterology;  Laterality: N/A;     A IV Location/Drains/Wounds Patient Lines/Drains/Airways Status     Active Line/Drains/Airways     Name Placement date Placement time Site Days   Peripheral IV 03/23/23 20 G Anterior;Left;Proximal Forearm 03/23/23  1832  Forearm  1            Intake/Output Last 24 hours No intake or output data in the 24 hours ending 03/24/23 0026  Labs/Imaging Results for orders placed or performed during the hospital encounter of 03/23/23 (from the past 48 hour(s))  Urinalysis, Routine w reflex microscopic -Urine, Clean Catch     Status: Abnormal   Collection Time: 03/23/23  5:01 PM  Result Value Ref Range   Color, Urine YELLOW YELLOW   APPearance CLEAR CLEAR   Specific Gravity, Urine 1.035 (H) 1.005 - 1.030   pH 5.0 5.0 - 8.0   Glucose, UA NEGATIVE NEGATIVE mg/dL   Hgb urine dipstick SMALL (A) NEGATIVE   Bilirubin Urine NEGATIVE NEGATIVE   Ketones, ur 5 (A) NEGATIVE mg/dL   Protein, ur 30 (A) NEGATIVE mg/dL   Nitrite NEGATIVE NEGATIVE   Leukocytes,Ua NEGATIVE NEGATIVE   RBC / HPF 0-5 0 - 5 RBC/hpf   WBC, UA 0-5 0 - 5 WBC/hpf   Bacteria, UA NONE SEEN NONE SEEN   Squamous Epithelial / HPF 0-5 0 - 5 /HPF   Mucus PRESENT      Comment: Performed at [facility], [address] [place], [place] [address]  Rapid urine drug screen (hospital performed)     Status: None   Collection Time: 03/23/23  5:01 PM  Result Value Ref Range   Opiates NONE DETECTED NONE DETECTED   Cocaine NONE DETECTED NONE DETECTED   Benzodiazepines NONE DETECTED NONE DETECTED   Amphetamines NONE DETECTED NONE DETECTED   Tetrahydrocannabinol NONE DETECTED NONE DETECTED   Barbiturates NONE DETECTED NONE DETECTED    Comment: (NOTE) DRUG SCREEN FOR MEDICAL PURPOSES ONLY.  IF CONFIRMATION IS NEEDED FOR ANY PURPOSE, NOTIFY LAB WITHIN 5 DAYS.  LOWEST DETECTABLE LIMITS FOR URINE DRUG SCREEN Drug Class                     Cutoff (ng/mL) Amphetamine and metabolites    1000 Barbiturate and metabolites    200 Benzodiazepine                 200 Opiates and metabolites        300 Cocaine and metabolites  300 THC                            50 Performed at [facility], [address] [place], [place] [address]   Lipase, blood     Status: Abnormal   Collection Time: 03/23/23  5:08 PM  Result Value Ref Range   Lipase 290 (H) 11 - 51 U/L    Comment: Performed at [facility], [address] [place], [place] [address]  Comprehensive metabolic panel     Status: Abnormal   Collection Time: 03/23/23  5:08 PM  Result Value Ref Range   Sodium 139 135 - 145 mmol/L   Potassium 3.8 3.5 - 5.1 mmol/L   Chloride 104 98 - 111 mmol/L   CO2 22 22 - 32 mmol/L   Glucose, Bld 133 (H) 70 - 99 mg/dL    Comment: Glucose reference range applies only to samples taken after fasting for at least 8 hours.   BUN 7 6 - 20 mg/dL   Creatinine, Ser 0.98 0.61 - 1.24 mg/dL   Calcium 8.9 8.9 - 11.9 mg/dL   Total Protein 7.4 6.5 - 8.1 g/dL   Albumin 4.0 3.5 - 5.0 g/dL   AST 41 15 - 41 U/L   ALT 24 0 - 44 U/L   Alkaline Phosphatase 70 38 - 126 U/L   Total Bilirubin 1.9 (H) <1.2 mg/dL   GFR, Estimated >14 >78 mL/min    Comment: (NOTE) Calculated  using the CKD-EPI Creatinine Equation (2021)    Anion gap 13 5 - 15    Comment: Performed at [facility], [address] [place], [place] [address]  CBC     Status: Abnormal   Collection Time: 03/23/23  5:08 PM  Result Value Ref Range   WBC 8.5 4.0 - 10.5 K/uL   RBC 3.84 (L) 4.22 - 5.81 MIL/uL   Hemoglobin 14.0 13.0 - 17.0 g/dL   HCT 13.0 86.5 - 78.4 %   MCV 105.5 (H) 80.0 - 100.0 fL   MCH 36.5 (H) 26.0 - 34.0 pg   MCHC 34.6 30.0 - 36.0 g/dL   RDW 69.6 (H) 29.5 - 28.4 %   Platelets 199 150 - 400 K/uL   nRBC 0.0 0.0 - 0.2 %    Comment: Performed at [facility], [address], [place], [place] [address]   CT ABDOMEN PELVIS W CONTRAST  Result Date: 03/23/2023 CLINICAL DATA:  Abdominal pain, elevated lipase EXAM: CT ABDOMEN AND PELVIS WITH CONTRAST TECHNIQUE: Multidetector CT imaging of the abdomen and pelvis was performed using the standard protocol following bolus administration of intravenous contrast. RADIATION DOSE REDUCTION: This exam was performed according to the departmental dose-optimization program which includes automated exposure control, adjustment of the mA and/or kV according to patient size and/or use of iterative reconstruction technique. CONTRAST:  75mL OMNIPAQUE IOHEXOL 350 MG/ML SOLN COMPARISON:  10/14/2022 FINDINGS: Lower chest: Lung bases are clear. Hepatobiliary: Liver is within normal limits. Status post cholecystectomy. No intrahepatic or extrahepatic dilatation. Pancreas: Peripancreatic inflammatory changes, most prominent along the pancreatic head and uncinate process. Additional parenchymal calcifications in the posterior aspect of the pancreatic head. No main pancreatic dilatation. No well-defined fluid collection/pseudocyst. Spleen: Within normal limits. Adrenals/Urinary Tract: Adrenal glands are within normal limits. Severe right renal cortical scarring/atrophy, unchanged. Associated 17 mm simple cyst (series 3/image 31), benign (Bosniak I). 10 mm  nonobstructing left lower pole renal  calculus (series 3/image 44), similar. No ureteral or bladder calculi. No hydronephrosis. Bladder is within normal limits. Stomach/Bowel: Stomach is within normal limits. Secondary inflammatory changes involving the duodenum. No evidence of bowel obstruction. Normal appendix (series 3/image 62). No colonic wall thickening or inflammatory changes. Vascular/Lymphatic: No evidence of abdominal aortic aneurysm. No suspicious abdominopelvic lymphadenopathy. Reproductive: Prostate is unremarkable. Other: No abdominopelvic ascites. Musculoskeletal: Mild degenerative changes at L5-S1. IMPRESSION: Acute on chronic pancreatitis, as above. No well-defined fluid collection/pseudocyst. Secondary inflammatory changes involving the duodenum. 10 mm nonobstructing left lower pole renal calculus. No hydronephrosis. Electronically Signed   By: Charline Bills M.D.   On: 03/23/2023 18:57    Pending Labs Unresulted Labs (From admission, onward)     Start     Ordered   03/24/23 0500  Magnesium  Tomorrow morning,   R        03/23/23 2142   03/24/23 0500  Phosphorus  Tomorrow morning,   R        03/23/23 2142   03/24/23 0500  Comprehensive metabolic panel  Tomorrow morning,   R       Question:  Release to patient  Answer:  Immediate   03/23/23 2142   03/24/23 0500  CBC  Tomorrow morning,   R       Question:  Release to patient  Answer:  Immediate   03/23/23 2142   03/23/23 2112  CK  Add-on,   AD        03/23/23 2111   03/23/23 2112  Magnesium  Add-on,   AD        03/23/23 2111   03/23/23 2112  Phosphorus  Add-on,   AD        03/23/23 2111   03/23/23 2112  Ethanol  Add-on,   AD        03/23/23 2111            Vitals/Pain Today's Vitals   03/23/23 2055 03/23/23 2056 03/23/23 2213 03/24/23 0003  BP:      Pulse:      Resp:      Temp:  98.8 F (37.1 C)    TempSrc:  Oral    SpO2:      Weight:      Height:      PainSc: 8   10-Worst pain ever 10-Worst pain ever     Isolation Precautions No active isolations  Medications Medications  HYDROmorphone (DILAUDID) injection 0.5-1 mg (1 mg Intravenous Given 03/23/23 2218)  nicotine (NICODERM CQ - dosed in mg/24 hr) patch 7 mg (7 mg Transdermal Patch Applied 03/23/23 2223)  metoCLOPramide (REGLAN) injection 5 mg (5 mg Intravenous Given 03/23/23 2220)  carvedilol (COREG) tablet 3.125 mg (has no administration in time range)  citalopram (CELEXA) tablet 40 mg (has no administration in time range)  famotidine (PEPCID) tablet 20 mg (has no administration in time range)  gabapentin (NEURONTIN) capsule 300 mg (300 mg Oral Given 03/23/23 2225)  traMADol (ULTRAM) tablet 50 mg (has no administration in time range)  0.9 %  sodium chloride infusion (has no administration in time range)  acetaminophen (TYLENOL) tablet 650 mg (has no administration in time range)    Or  acetaminophen (TYLENOL) suppository 650 mg (has no administration in time range)  HYDROcodone-acetaminophen (NORCO/VICODIN) 5-325 MG per tablet 1-2 tablet (2 tablets Oral Given 03/24/23 0004)  ondansetron (ZOFRAN) tablet 4 mg (has no administration in time range)    Or  ondansetron (ZOFRAN) injection 4 mg (has no administration  in time range)  albuterol (PROVENTIL) (2.5 MG/3ML) 0.083% nebulizer solution 2.5 mg (has no administration in time range)  lipase/protease/amylase (CREON) capsule 72,000 Units (has no administration in time range)  lipase/protease/amylase (CREON) capsule 36,000 Units (has no administration in time range)  sodium chloride 0.9 % bolus 1,000 mL (0 mLs Intravenous Stopped 03/23/23 2036)  ondansetron (ZOFRAN) injection 4 mg (4 mg Intravenous Given 03/23/23 1834)  HYDROmorphone (DILAUDID) injection 1 mg (1 mg Intravenous Given 03/23/23 1834)  iohexol (OMNIPAQUE) 350 MG/ML injection 75 mL (75 mLs Intravenous Contrast Given 03/23/23 1850)  HYDROmorphone (DILAUDID) injection 1 mg (1 mg Intravenous Given 03/23/23 1949)    Mobility walks      Focused Assessments    R Recommendations: See Admitting Provider Note  Report given to:   Additional Notes:  Pt a&ox4. Pt uses urinal.

## 2023-03-24 NOTE — Plan of Care (Signed)

## 2023-03-24 NOTE — Plan of Care (Signed)
  Problem: Health Behavior/Discharge Planning: Goal: Ability to manage health-related needs will improve Outcome: Progressing   Problem: Clinical Measurements: Goal: Ability to maintain clinical measurements within normal limits will improve Outcome: Progressing Goal: Will remain free from infection Outcome: Progressing Goal: Diagnostic test results will improve Outcome: Progressing Goal: Respiratory complications will improve Outcome: Progressing Goal: Cardiovascular complication will be avoided Outcome: Progressing   Problem: Education: Goal: Knowledge of General Education information will improve Description: Including pain rating scale, medication(s)/side effects and non-pharmacologic comfort measures Outcome: Progressing   Problem: Nutrition: Goal: Adequate nutrition will be maintained Outcome: Progressing

## 2023-03-25 DIAGNOSIS — K861 Other chronic pancreatitis: Secondary | ICD-10-CM | POA: Diagnosis not present

## 2023-03-25 DIAGNOSIS — K859 Acute pancreatitis without necrosis or infection, unspecified: Secondary | ICD-10-CM | POA: Diagnosis not present

## 2023-03-25 MED ORDER — KCL IN DEXTROSE-NACL 20-5-0.45 MEQ/L-%-% IV SOLN
INTRAVENOUS | Status: AC
  Filled 2023-03-25 (×3): qty 1000

## 2023-03-25 NOTE — Progress Notes (Signed)
PROGRESS NOTE    Kyle Berry  WGN:[id number] DOB: 1970/09/07 DOA: 03/23/2023 PCP: [facility]    Brief Narrative:  52 year old with history of chronic pancreatitis, multiple previous admissions.  Ongoing use of alcohol, latest admission 5 weeks ago, has history of hypertension presented with diffuse abdominal pain similar to prior pancreatitis episodes, unable to eat or drink.  In the emergency room hemodynamically stable.  On room air.  Lipase 290.  CT scan with peripancreatic inflammatory changes most prominent along the pancreatic head and uncinate process.  Subjective: Patient seen and examined.  Continues to complain of nausea.  He is trying to use oral pain medications so we can start feeding him..  Complains of ongoing pain.  Assessment & Plan:    Acute on chronic pancreatitis, alcohol induced.  Uncomplicated but with severe pain. N.p.o., IV fluids, adequate oral and IV opiates for pain relief.  Correction of electrolytes. Continue pancrelipase.  Continue gabapentin.  Hypokalemia/hypomagnesemia: Replace aggressively today.  Recheck levels.  Essential hypertension: Blood pressure stable on Coreg.  Smoker: Counseled to quit.  Nicotine patch provided.  Alcohol use: Patient tells me he is not drinking anymore.  Reported still use of alcohol.  Counseled.   DVT prophylaxis: SCDs Start: 03/23/23 2142   Code Status: Full code Family Communication: None at the bedside Disposition Plan: Status is: Inpatient Remains inpatient appropriate because: IV fluids, IV pain medications,     Consultants:  None  Procedures:  None  Antimicrobials:  None     Objective: Vitals:   03/24/23 2008 03/25/23 0016 03/25/23 0557 03/25/23 0746  BP: (!) 156/102 (!) 157/99 (!) 156/111 (!) 156/98  Pulse: 74 78 77 69  Resp: 18 18 18 18   Temp: 98.9 F (37.2 C) 98.4 F (36.9 C) 98.1 F (36.7 C) 98.4 F (36.9 C)  TempSrc: Oral Oral Oral   SpO2: 100% 99%  100% 96%  Weight:      Height:        Intake/Output Summary (Last 24 hours) at 03/25/2023 1216 Last data filed at 03/25/2023 0847 Gross per 24 hour  Intake 605.27 ml  Output --  Net 605.27 ml   Filed Weights   03/23/23 1657 03/24/23 0450  Weight: 73.2 kg 69.7 kg    Examination:  General exam: Looks comfortable.  Complains of pain. Respiratory system: Clear to auscultation. Respiratory effort normal. Cardiovascular system: S1 & S2 heard, RRR.  Gastrointestinal system: Abdomen is nondistended, soft. Subjective tenderness present. Normal bowel sounds heard. Central nervous system: Alert and oriented. No focal neurological deficits.    Data Reviewed: I have personally reviewed following labs and imaging studies  CBC: Recent Labs  Lab 03/23/23 1708 03/24/23 0718  WBC 8.5 10.2  HGB 14.0 13.9  HCT 40.5 40.0  MCV 105.5* 105.8*  PLT 199 182   Basic Metabolic Panel: Recent Labs  Lab 03/23/23 1708 03/24/23 0718 03/24/23 0719  NA 139 143  --   K 3.8 3.3*  --   CL 104 105  --   CO2 22 22  --   GLUCOSE 133* 143*  --   BUN 7 6  --   CREATININE 1.17 1.22  --   CALCIUM 8.9 8.6*  --   MG  --  1.5* 1.5*  PHOS  --  3.9 4.0   GFR: Estimated Creatinine Clearance: 69 mL/min (by C-G formula based on SCr of 1.22 mg/dL). Liver Function Tests: Recent Labs  Lab 03/23/23 1708 03/24/23 0718  AST 41  28  ALT 24 21  ALKPHOS 70 65  BILITOT 1.9* 1.6*  PROT 7.4 7.0  ALBUMIN 4.0 3.7   Recent Labs  Lab 03/23/23 1708  LIPASE 290*   No results for input(s): "AMMONIA" in the last 168 hours. Coagulation Profile: No results for input(s): "INR", "PROTIME" in the last 168 hours. Cardiac Enzymes: Recent Labs  Lab 03/24/23 0719  CKTOTAL 60   BNP (last 3 results) No results for input(s): "PROBNP" in the last 8760 hours. HbA1C: No results for input(s): "HGBA1C" in the last 72 hours. CBG: No results for input(s): "GLUCAP" in the last 168 hours. Lipid Profile: No results for  input(s): "CHOL", "HDL", "LDLCALC", "TRIG", "CHOLHDL", "LDLDIRECT" in the last 72 hours. Thyroid Function Tests: No results for input(s): "TSH", "T4TOTAL", "FREET4", "T3FREE", "THYROIDAB" in the last 72 hours. Anemia Panel: No results for input(s): "VITAMINB12", "FOLATE", "FERRITIN", "TIBC", "IRON", "RETICCTPCT" in the last 72 hours. Sepsis Labs: No results for input(s): "PROCALCITON", "LATICACIDVEN" in the last 168 hours.  No results found for this or any previous visit (from the past 240 hour(s)).       Radiology Studies: CT ABDOMEN PELVIS W CONTRAST  Result Date: 03/23/2023 CLINICAL DATA:  Abdominal pain, elevated lipase EXAM: CT ABDOMEN AND PELVIS WITH CONTRAST TECHNIQUE: Multidetector CT imaging of the abdomen and pelvis was performed using the standard protocol following bolus administration of intravenous contrast. RADIATION DOSE REDUCTION: This exam was performed according to the departmental dose-optimization program which includes automated exposure control, adjustment of the mA and/or kV according to patient size and/or use of iterative reconstruction technique. CONTRAST:  75mL OMNIPAQUE IOHEXOL 350 MG/ML SOLN COMPARISON:  10/14/2022 FINDINGS: Lower chest: Lung bases are clear. Hepatobiliary: Liver is within normal limits. Status post cholecystectomy. No intrahepatic or extrahepatic dilatation. Pancreas: Peripancreatic inflammatory changes, most prominent along the pancreatic head and uncinate process. Additional parenchymal calcifications in the posterior aspect of the pancreatic head. No main pancreatic dilatation. No well-defined fluid collection/pseudocyst. Spleen: Within normal limits. Adrenals/Urinary Tract: Adrenal glands are within normal limits. Severe right renal cortical scarring/atrophy, unchanged. Associated 17 mm simple cyst (series 3/image 31), benign (Bosniak I). 10 mm nonobstructing left lower pole renal calculus (series 3/image 44), similar. No ureteral or bladder  calculi. No hydronephrosis. Bladder is within normal limits. Stomach/Bowel: Stomach is within normal limits. Secondary inflammatory changes involving the duodenum. No evidence of bowel obstruction. Normal appendix (series 3/image 62). No colonic wall thickening or inflammatory changes. Vascular/Lymphatic: No evidence of abdominal aortic aneurysm. No suspicious abdominopelvic lymphadenopathy. Reproductive: Prostate is unremarkable. Other: No abdominopelvic ascites. Musculoskeletal: Mild degenerative changes at L5-S1. IMPRESSION: Acute on chronic pancreatitis, as above. No well-defined fluid collection/pseudocyst. Secondary inflammatory changes involving the duodenum. 10 mm nonobstructing left lower pole renal calculus. No hydronephrosis. Electronically Signed   By: Charline Bills M.D.   On: 03/23/2023 18:57        Scheduled Meds:  carvedilol  3.125 mg Oral BID WC   citalopram  40 mg Oral Daily   famotidine  20 mg Oral Daily   gabapentin  300 mg Oral TID   lipase/protease/amylase  36,000 Units Oral With snacks   lipase/protease/amylase  72,000 Units Oral TID WC   nicotine  7 mg Transdermal Daily   Continuous Infusions:  dextrose 5 % and 0.45 % NaCl with KCl 20 mEq/L 100 mL/hr at 03/25/23 1141     LOS: 2 days    Time spent: 40 minutes     Dorcas Carrow, MD [facility]

## 2023-03-25 NOTE — Plan of Care (Signed)

## 2023-03-26 DIAGNOSIS — K861 Other chronic pancreatitis: Secondary | ICD-10-CM | POA: Diagnosis not present

## 2023-03-26 DIAGNOSIS — K859 Acute pancreatitis without necrosis or infection, unspecified: Secondary | ICD-10-CM | POA: Diagnosis not present

## 2023-03-26 LAB — COMPREHENSIVE METABOLIC PANEL
ALT: 19 U/L (ref 0–44)
AST: 47 U/L — ABNORMAL HIGH (ref 15–41)
Albumin: 2.9 g/dL — ABNORMAL LOW (ref 3.5–5.0)
Alkaline Phosphatase: 74 U/L (ref 38–126)
Anion gap: 9 (ref 5–15)
BUN: 5 mg/dL — ABNORMAL LOW (ref 6–20)
CO2: 23 mmol/L (ref 22–32)
Calcium: 8 mg/dL — ABNORMAL LOW (ref 8.9–10.3)
Chloride: 103 mmol/L (ref 98–111)
Creatinine, Ser: 0.9 mg/dL (ref 0.61–1.24)
GFR, Estimated: >60 mL/min (ref >60)
Glucose, Bld: 140 mg/dL — ABNORMAL HIGH (ref 70–99)
Potassium: 3.8 mmol/L (ref 3.5–5.1)
Sodium: 135 mmol/L (ref 135–145)
Total Bilirubin: 0.6 mg/dL (ref <1.2)
Total Protein: 6.1 g/dL — ABNORMAL LOW (ref 6.5–8.1)

## 2023-03-26 LAB — PHOSPHORUS: Phosphorus: 2.6 mg/dL (ref 2.5–4.6)

## 2023-03-26 LAB — LIPASE, BLOOD: Lipase: 45 U/L (ref 11–51)

## 2023-03-26 LAB — MAGNESIUM: Magnesium: 1.8 mg/dL (ref 1.7–2.4)

## 2023-03-26 MED ORDER — KCL IN DEXTROSE-NACL 20-5-0.45 MEQ/L-%-% IV SOLN
INTRAVENOUS | Status: DC
  Filled 2023-03-26 (×2): qty 1000

## 2023-03-26 MED ORDER — HYDROMORPHONE HCL 1 MG/ML IJ SOLN
0.5000 mg | INTRAMUSCULAR | Status: DC | PRN
  Administered 2023-03-26 (×2): 0.5 mg via INTRAVENOUS
  Filled 2023-03-26 (×2): qty 0.5

## 2023-03-26 NOTE — Progress Notes (Signed)
Mobility Specialist Progress Note:   03/26/23 1013  Mobility  Activity Ambulated independently in hallway  Level of Assistance Independent  Assistive Device None  Distance Ambulated (ft) 500 ft  Activity Response Tolerated well  Mobility Referral Yes  Mobility visit 1 Mobility  Mobility Specialist Start Time (ACUTE ONLY) 1013  Mobility Specialist Stop Time (ACUTE ONLY) 1023  Mobility Specialist Time Calculation (min) (ACUTE ONLY) 10 min   Pt eager for mobility session. Required no physical assistance throughout pt. Denies SOB. Back in bed with all needs met.  Addison Lank Mobility Specialist Please contact via SecureChat or  Rehab office at [phone]

## 2023-03-26 NOTE — Progress Notes (Signed)
PROGRESS NOTE    Kyle Berry  ZHY:[id number] DOB: 11-22-1970 DOA: 03/23/2023 PCP: [facility]    Brief Narrative:  52 year old with history of chronic pancreatitis, multiple previous admissions.  Ongoing use of alcohol, latest admission 5 weeks ago, has history of hypertension presented with diffuse abdominal pain similar to prior pancreatitis episodes, unable to eat or drink.  In the emergency room hemodynamically stable.  On room air.  Lipase 290.  CT scan with peripancreatic inflammatory changes most prominent along the pancreatic head and uncinate process.  Subjective:  Patient seen and examined.  Still complains of occasional nausea and abdominal pain.  He is trying some clear liquids.  He looks more comfortable today.  Lipase is normalized.  Electrolytes are adequate.  Assessment & Plan:    Acute on chronic pancreatitis, alcohol induced.  Still with significant symptoms.  Challenging with clears. Continue IV fluids.  Continue oral and IV opiates for pain relief.  Will encourage oral intake. Continue pancrelipase.  Continue gabapentin.  Hypokalemia/hypomagnesemia: Replaced and adequate.  Essential hypertension: Blood pressure stable on Coreg.  Smoker: Counseled to quit.  Nicotine patch provided.  Alcohol use: Patient tells me he is not drinking anymore.  Uncomplicated so far.   DVT prophylaxis: SCDs Start: 03/23/23 2142   Code Status: Full code Family Communication: None at the bedside Disposition Plan: Status is: Inpatient Remains inpatient appropriate because: IV fluids, IV pain medications,     Consultants:  None  Procedures:  None  Antimicrobials:  None     Objective: Vitals:   03/25/23 2130 03/26/23 0359 03/26/23 0756 03/26/23 1020  BP: (!) 146/93 (!) 152/97 (!) 157/94 (!) 145/96  Pulse: 66 64 61 74  Resp: 18 18 18    Temp: 98.6 F (37 C) 98.6 F (37 C) (!) 97.3 F (36.3 C)   TempSrc: Oral Oral Oral   SpO2:  99% 99% 98%   Weight:      Height:        Intake/Output Summary (Last 24 hours) at 03/26/2023 1054 Last data filed at 03/26/2023 1020 Gross per 24 hour  Intake 1999.47 ml  Output 200 ml  Net 1799.47 ml   Filed Weights   03/23/23 1657 03/24/23 0450  Weight: 73.2 kg 69.7 kg    Examination:  General exam: Looks comfortable.  On room air. Respiratory system: Clear to auscultation. Respiratory effort normal. Cardiovascular system: S1 & S2 heard, RRR.  Gastrointestinal system: Abdomen is nondistended, soft. Subjective tenderness present. Normal bowel sounds heard. Central nervous system: Alert and oriented. No focal neurological deficits.    Data Reviewed: I have personally reviewed following labs and imaging studies  CBC: Recent Labs  Lab 03/23/23 1708 03/24/23 0718  WBC 8.5 10.2  HGB 14.0 13.9  HCT 40.5 40.0  MCV 105.5* 105.8*  PLT 199 182   Basic Metabolic Panel: Recent Labs  Lab 03/23/23 1708 03/24/23 0718 03/24/23 0719 03/26/23 0532  NA 139 143  --  135  K 3.8 3.3*  --  3.8  CL 104 105  --  103  CO2 22 22  --  23  GLUCOSE 133* 143*  --  140*  BUN 7 6  --  5*  CREATININE 1.17 1.22  --  0.90  CALCIUM 8.9 8.6*  --  8.0*  MG  --  1.5* 1.5* 1.8  PHOS  --  3.9 4.0 2.6   GFR: Estimated Creatinine Clearance: 93.6 mL/min (by C-G formula based on SCr of 0.9 mg/dL).  Liver Function Tests: Recent Labs  Lab 03/23/23 1708 03/24/23 0718 03/26/23 0532  AST 41 28 47*  ALT 24 21 19   ALKPHOS 70 65 74  BILITOT 1.9* 1.6* 0.6  PROT 7.4 7.0 6.1*  ALBUMIN 4.0 3.7 2.9*   Recent Labs  Lab 03/23/23 1708 03/26/23 0532  LIPASE 290* 45   No results for input(s): "AMMONIA" in the last 168 hours. Coagulation Profile: No results for input(s): "INR", "PROTIME" in the last 168 hours. Cardiac Enzymes: Recent Labs  Lab 03/24/23 0719  CKTOTAL 60   BNP (last 3 results) No results for input(s): "PROBNP" in the last 8760 hours. HbA1C: No results for input(s): "HGBA1C"  in the last 72 hours. CBG: No results for input(s): "GLUCAP" in the last 168 hours. Lipid Profile: No results for input(s): "CHOL", "HDL", "LDLCALC", "TRIG", "CHOLHDL", "LDLDIRECT" in the last 72 hours. Thyroid Function Tests: No results for input(s): "TSH", "T4TOTAL", "FREET4", "T3FREE", "THYROIDAB" in the last 72 hours. Anemia Panel: No results for input(s): "VITAMINB12", "FOLATE", "FERRITIN", "TIBC", "IRON", "RETICCTPCT" in the last 72 hours. Sepsis Labs: No results for input(s): "PROCALCITON", "LATICACIDVEN" in the last 168 hours.  No results found for this or any previous visit (from the past 240 hour(s)).       Radiology Studies: No results found.      Scheduled Meds:  carvedilol  3.125 mg Oral BID WC   citalopram  40 mg Oral Daily   famotidine  20 mg Oral Daily   gabapentin  300 mg Oral TID   lipase/protease/amylase  36,000 Units Oral With snacks   lipase/protease/amylase  72,000 Units Oral TID WC   nicotine  7 mg Transdermal Daily   Continuous Infusions:  dextrose 5 % and 0.45 % NaCl with KCl 20 mEq/L       LOS: 3 days    Time spent: 40 minutes     Dorcas Carrow, MD [facility]

## 2023-03-27 DIAGNOSIS — K861 Other chronic pancreatitis: Secondary | ICD-10-CM | POA: Diagnosis not present

## 2023-03-27 DIAGNOSIS — K859 Acute pancreatitis without necrosis or infection, unspecified: Secondary | ICD-10-CM | POA: Diagnosis not present

## 2023-03-27 MED ORDER — HYDROCODONE-ACETAMINOPHEN 5-325 MG PO TABS: 1.0000 | ORAL_TABLET | Freq: Four times a day (QID) | ORAL | 0 refills | Status: DC | PRN

## 2023-03-27 MED ORDER — PANCRELIPASE (LIP-PROT-AMYL) 36000-114000 UNITS PO CPEP: 36000.0000 [IU] | ORAL_CAPSULE | ORAL | 0 refills | Status: AC

## 2023-03-27 MED ORDER — PROMETHAZINE HCL 25 MG PO TABS: 25.0000 mg | ORAL_TABLET | Freq: Four times a day (QID) | ORAL | 1 refills | Status: DC | PRN

## 2023-03-27 NOTE — Discharge Summary (Signed)
Physician Discharge Summary  Kyle Berry:[id number] DOB: 10/11/1970 DOA: 03/23/2023  PCP: [facility]  Admit date: 03/23/2023 Discharge date: 03/27/2023  Admitted From: Home Disposition: Home  Recommendations for Outpatient Follow-up:  Follow up with PCP in 1-2 weeks  Discharge Condition: Stable CODE STATUS: Full code Diet recommendation: Soft diet, liquid diet.  Discharge summary: 52 year old with history of chronic alcoholic pancreatitis, multiple previous admissions.  Denies ongoing use of alcohol.  latest admission 5 weeks ago, has history of hypertension presented with diffuse abdominal pain similar to prior pancreatitis episodes, unable to eat or drink. In the emergency room hemodynamically stable. On room air. Lipase 290. CT scan with peripancreatic inflammatory changes most prominent along the pancreatic head and uncinate process.   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  Patient was treated with IV fluids, IV opiates.  Initially kept n.p.o. and slowly on liquid diet.  His symptoms have improved with remaining mild to moderate pain, however tolerating liquid diet.  Denies any nausea.  Patient is stabilized to go home.  He will be prescribed a short course of pain medications and nausea medications to help him get over this acute episode.  Electrolytes are replaced and adequate.  Discussed about absolute alcohol cessation.  Counseled about smoking cessation.  Resume antihypertensives.  Stable for discharge.   Discharge Diagnoses:  Active Problems:   Acute on chronic pancreatitis [facility])   Essential hypertension   Congenital single kidney   Kidney stone   Pancreatitis    Discharge Instructions  Discharge Instructions     Diet general   Complete by: As directed    Stay on liquid or soft diet until feeling very well   Increase activity slowly   Complete by: As directed       Allergies as of 03/27/2023        Reactions   Norvasc [amlodipine Besylate] Other (See Comments)   Fluid buildup in chest   Voltaren [diclofenac] Hives, Other (See Comments)   Fluid buildup in chest   Prilosec [omeprazole] Other (See Comments)   MD stopped due to pancreatitis   Hydrochlorothiazide Itching   Ultram [tramadol] Other (See Comments)   Stomach upset   Prednisone Hives, Other (See Comments)   Mood swings        Medication List     STOP taking these medications    citalopram 40 MG tablet Commonly known as: CELEXA   traMADol 50 MG tablet Commonly known as: ULTRAM       TAKE these medications    acetaminophen 500 MG tablet Commonly known as: TYLENOL Take 1,000 mg by mouth 2 (two) times daily as needed for moderate pain, fever or headache.   albuterol 108 (90 Base) MCG/ACT inhaler Commonly known as: VENTOLIN HFA Inhale 2 puffs into the lungs every 6 (six) hours as needed for wheezing or shortness of breath.   carvedilol 3.125 MG tablet Commonly known as: COREG Take 1 tablet (3.125 mg total) by mouth 2 (two) times daily with a meal.   Ensure Original Liqd Take 1 Container by mouth 3 (three) times daily.   famotidine 20 MG tablet Commonly known as: PEPCID Take 1 tablet (20 mg total) by mouth 2 (two) times daily for 30 days, THEN 1 tablet (20 mg total) daily. Start taking on: October 23, 2022 What changed: See the new instructions.   gabapentin 300 MG capsule Commonly known as: NEURONTIN Take 300 mg by mouth 3 (three) times daily.  HYDROcodone-acetaminophen 5-325 MG tablet Commonly known as: NORCO/VICODIN Take 1 tablet by mouth every 6 (six) hours as needed for moderate pain (pain score 4-6) or severe pain (pain score 7-10).   lipase/protease/amylase 45409 UNITS Cpep capsule Commonly known as: CREON Take 1 capsule (36,000 Units total) by mouth See admin instructions. What changed:  when to take this reasons to take this   naloxone 4 MG/0.1ML Liqd nasal spray kit Commonly  known as: NARCAN Place 1 spray into the nose daily as needed (opioid reversal).   promethazine 25 MG tablet Commonly known as: PHENERGAN Take 1 tablet (25 mg total) by mouth every 6 (six) hours as needed for nausea or vomiting.   VISINE OP Place 1 drop into both eyes daily.        Allergies  Allergen Reactions   Norvasc [Amlodipine Besylate] Other (See Comments)    Fluid buildup in chest   Voltaren [Diclofenac] Hives and Other (See Comments)    Fluid buildup in chest   Prilosec [Omeprazole] Other (See Comments)    MD stopped due to pancreatitis   Hydrochlorothiazide Itching   Ultram [Tramadol] Other (See Comments)    Stomach upset   Prednisone Hives and Other (See Comments)    Mood swings    Consultations: None   Procedures/Studies: CT ABDOMEN PELVIS W CONTRAST  Result Date: 03/23/2023 CLINICAL DATA:  Abdominal pain, elevated lipase EXAM: CT ABDOMEN AND PELVIS WITH CONTRAST TECHNIQUE: Multidetector CT imaging of the abdomen and pelvis was performed using the standard protocol following bolus administration of intravenous contrast. RADIATION DOSE REDUCTION: This exam was performed according to the departmental dose-optimization program which includes automated exposure control, adjustment of the mA and/or kV according to patient size and/or use of iterative reconstruction technique. CONTRAST:  75mL OMNIPAQUE IOHEXOL 350 MG/ML SOLN COMPARISON:  10/14/2022 FINDINGS: Lower chest: Lung bases are clear. Hepatobiliary: Liver is within normal limits. Status post cholecystectomy. No intrahepatic or extrahepatic dilatation. Pancreas: Peripancreatic inflammatory changes, most prominent along the pancreatic head and uncinate process. Additional parenchymal calcifications in the posterior aspect of the pancreatic head. No main pancreatic dilatation. No well-defined fluid collection/pseudocyst. Spleen: Within normal limits. Adrenals/Urinary Tract: Adrenal glands are within normal limits. Severe  right renal cortical scarring/atrophy, unchanged. Associated 17 mm simple cyst (series 3/image 31), benign (Bosniak I). 10 mm nonobstructing left lower pole renal calculus (series 3/image 44), similar. No ureteral or bladder calculi. No hydronephrosis. Bladder is within normal limits. Stomach/Bowel: Stomach is within normal limits. Secondary inflammatory changes involving the duodenum. No evidence of bowel obstruction. Normal appendix (series 3/image 62). No colonic wall thickening or inflammatory changes. Vascular/Lymphatic: No evidence of abdominal aortic aneurysm. No suspicious abdominopelvic lymphadenopathy. Reproductive: Prostate is unremarkable. Other: No abdominopelvic ascites. Musculoskeletal: Mild degenerative changes at L5-S1. IMPRESSION: Acute on chronic pancreatitis, as above. No well-defined fluid collection/pseudocyst. Secondary inflammatory changes involving the duodenum. 10 mm nonobstructing left lower pole renal calculus. No hydronephrosis. Electronically Signed   By: Charline Bills M.D.   On: 03/23/2023 18:57   (Echo, Carotid, EGD, Colonoscopy, ERCP)    Subjective: Patient seen and examined.  Still has occasional moderate pain but tolerating liquid.  Up about in the hallway.  Eager to go home.   Discharge Exam: Vitals:   03/27/23 0419 03/27/23 0725  BP: (!) 177/99 (!) 153/97  Pulse: 70 71  Resp: 18 18  Temp: 98.6 F (37 C) 98.5 F (36.9 C)  SpO2: 98% 98%   Vitals:   03/26/23 1634 03/26/23 2044 03/27/23 0419 03/27/23 0725  BP: (!) 159/99 125/83 (!) 177/99 (!) 153/97  Pulse: 62 87 70 71  Resp: 18 18 18 18   Temp: 98.2 F (36.8 C) 99.2 F (37.3 C) 98.6 F (37 C) 98.5 F (36.9 C)  TempSrc: Oral Oral Oral Oral  SpO2: 98% 98% 98% 98%  Weight:      Height:        General: Pt is alert, awake, not in acute distress Cardiovascular: RRR, S1/S2 +, no rubs, no gallops Respiratory: CTA bilaterally, no wheezing, no rhonchi Abdominal: Soft,  ND, bowel sounds +, there is no  tenderness on abdominal palpation.  He has some subjective tenderness. Extremities: no edema, no cyanosis    The results of significant diagnostics from this hospitalization (including imaging, microbiology, ancillary and laboratory) are listed below for reference.     Microbiology: No results found for this or any previous visit (from the past 240 hour(s)).   Labs: BNP (last 3 results) No results for input(s): "BNP" in the last 8760 hours. Basic Metabolic Panel: Recent Labs  Lab 03/23/23 1708 03/24/23 0718 03/24/23 0719 03/26/23 0532  NA 139 143  --  135  K 3.8 3.3*  --  3.8  CL 104 105  --  103  CO2 22 22  --  23  GLUCOSE 133* 143*  --  140*  BUN 7 6  --  5*  CREATININE 1.17 1.22  --  0.90  CALCIUM 8.9 8.6*  --  8.0*  MG  --  1.5* 1.5* 1.8  PHOS  --  3.9 4.0 2.6   Liver Function Tests: Recent Labs  Lab 03/23/23 1708 03/24/23 0718 03/26/23 0532  AST 41 28 47*  ALT 24 21 19   ALKPHOS 70 65 74  BILITOT 1.9* 1.6* 0.6  PROT 7.4 7.0 6.1*  ALBUMIN 4.0 3.7 2.9*   Recent Labs  Lab 03/23/23 1708 03/26/23 0532  LIPASE 290* 45   No results for input(s): "AMMONIA" in the last 168 hours. CBC: Recent Labs  Lab 03/23/23 1708 03/24/23 0718  WBC 8.5 10.2  HGB 14.0 13.9  HCT 40.5 40.0  MCV 105.5* 105.8*  PLT 199 182   Cardiac Enzymes: Recent Labs  Lab 03/24/23 0719  CKTOTAL 60   BNP: Invalid input(s): "POCBNP" CBG: No results for input(s): "GLUCAP" in the last 168 hours. D-Dimer No results for input(s): "DDIMER" in the last 72 hours. Hgb A1c No results for input(s): "HGBA1C" in the last 72 hours. Lipid Profile No results for input(s): "CHOL", "HDL", "LDLCALC", "TRIG", "CHOLHDL", "LDLDIRECT" in the last 72 hours. Thyroid function studies No results for input(s): "TSH", "T4TOTAL", "T3FREE", "THYROIDAB" in the last 72 hours.  Invalid input(s): "FREET3" Anemia work up No results for input(s): "VITAMINB12", "FOLATE", "FERRITIN", "TIBC", "IRON",  "RETICCTPCT" in the last 72 hours. Urinalysis    Component Value Date/Time   COLORURINE YELLOW 03/23/2023 1701   APPEARANCEUR CLEAR 03/23/2023 1701   LABSPEC 1.035 (H) 03/23/2023 1701   PHURINE 5.0 03/23/2023 1701   GLUCOSEU NEGATIVE 03/23/2023 1701   HGBUR SMALL (A) 03/23/2023 1701   BILIRUBINUR NEGATIVE 03/23/2023 1701   KETONESUR 5 (A) 03/23/2023 1701   PROTEINUR 30 (A) 03/23/2023 1701   UROBILINOGEN 0.2 01/20/2015 1643   NITRITE NEGATIVE 03/23/2023 1701   LEUKOCYTESUR NEGATIVE 03/23/2023 1701   Sepsis Labs Recent Labs  Lab 03/23/23 1708 03/24/23 0718  WBC 8.5 10.2   Microbiology No results found for this or any previous visit (from the past 240 hour(s)).   Time coordinating discharge: 35 minutes  SIGNED:   Dorcas Carrow, MD  [facility] 03/27/2023, 8:42 AM

## 2023-03-27 NOTE — Plan of Care (Signed)

## 2023-03-27 NOTE — Progress Notes (Signed)
Explained discharge instructions to patient. Reviewed follow up appointment and next medication administration times. Also reviewed education. Patient verbalized having an understanding for instructions given. All belongings are in the patient's possession. IV was removed by floor staff. No other needs verbalized. Transporting downstairs to the discharge lounge.

## 2023-04-02 ENCOUNTER — Emergency Department (HOSPITAL_COMMUNITY)
Admission: EM | Admit: 2023-04-02 | Discharge: 2023-04-03 | Discharge status: Against Medical Advice | Payer: Managed Care, Other (non HMO) | Attending: Nurse Practitioner | Admitting: Nurse Practitioner

## 2023-04-02 ENCOUNTER — Emergency Department (HOSPITAL_COMMUNITY): Payer: Managed Care, Other (non HMO)

## 2023-04-02 ENCOUNTER — Other Ambulatory Visit: Payer: Self-pay

## 2023-04-02 DIAGNOSIS — R0602 Shortness of breath: Secondary | ICD-10-CM | POA: Diagnosis not present

## 2023-04-02 DIAGNOSIS — R079 Chest pain, unspecified: Secondary | ICD-10-CM | POA: Insufficient documentation

## 2023-04-02 DIAGNOSIS — Z5321 Procedure and treatment not carried out due to patient leaving prior to being seen by health care provider: Secondary | ICD-10-CM | POA: Diagnosis not present

## 2023-04-02 LAB — CBC WITH DIFFERENTIAL/PLATELET
Abs Immature Granulocytes: 0.01 10*3/uL (ref 0.00–0.07)
Basophils Absolute: 0 10*3/uL (ref 0.0–0.1)
Basophils Relative: 1 %
Eosinophils Absolute: 0 10*3/uL (ref 0.0–0.5)
Eosinophils Relative: 1 %
HCT: 37.6 % — ABNORMAL LOW (ref 39.0–52.0)
Hemoglobin: 13 g/dL (ref 13.0–17.0)
Immature Granulocytes: 0 %
Lymphocytes Relative: 41 %
Lymphs Abs: 2.4 10*3/uL (ref 0.7–4.0)
MCH: 36.2 pg — ABNORMAL HIGH (ref 26.0–34.0)
MCHC: 34.6 g/dL (ref 30.0–36.0)
MCV: 104.7 fL — ABNORMAL HIGH (ref 80.0–100.0)
Monocytes Absolute: 0.6 10*3/uL (ref 0.1–1.0)
Monocytes Relative: 11 %
Neutro Abs: 2.7 10*3/uL (ref 1.7–7.7)
Neutrophils Relative %: 46 %
Platelets: 282 10*3/uL (ref 150–400)
RBC: 3.59 MIL/uL — ABNORMAL LOW (ref 4.22–5.81)
RDW: 16 % — ABNORMAL HIGH (ref 11.5–15.5)
WBC: 5.8 10*3/uL (ref 4.0–10.5)
nRBC: 0 % (ref 0.0–0.2)

## 2023-04-02 LAB — COMPREHENSIVE METABOLIC PANEL
ALT: 34 U/L (ref 0–44)
AST: 60 U/L — ABNORMAL HIGH (ref 15–41)
Albumin: 3.7 g/dL (ref 3.5–5.0)
Alkaline Phosphatase: 71 U/L (ref 38–126)
Anion gap: 16 — ABNORMAL HIGH (ref 5–15)
BUN: <5 mg/dL — ABNORMAL LOW (ref 6–20)
CO2: 23 mmol/L (ref 22–32)
Calcium: 9.5 mg/dL (ref 8.9–10.3)
Chloride: 101 mmol/L (ref 98–111)
Creatinine, Ser: 1.01 mg/dL (ref 0.61–1.24)
GFR, Estimated: >60 mL/min (ref >60)
Glucose, Bld: 143 mg/dL — ABNORMAL HIGH (ref 70–99)
Potassium: 3.3 mmol/L — ABNORMAL LOW (ref 3.5–5.1)
Sodium: 140 mmol/L (ref 135–145)
Total Bilirubin: 1.6 mg/dL — ABNORMAL HIGH (ref <1.2)
Total Protein: 7.2 g/dL (ref 6.5–8.1)

## 2023-04-02 LAB — LIPASE, BLOOD: Lipase: 34 U/L (ref 11–51)

## 2023-04-02 LAB — TROPONIN I (HIGH SENSITIVITY): Troponin I (High Sensitivity): 3 ng/L (ref <18)

## 2023-04-02 NOTE — ED Triage Notes (Addendum)
Patient reports central chest pain for several days with mild SOB , occasional emesis , denies fever or chills . History of pancreatitis. Brief syncope this evening .

## 2023-04-02 NOTE — ED Provider Triage Note (Signed)
Emergency Medicine Provider Triage Evaluation Note  Kyle Berry , a 52 y.o. male  was evaluated in triage.  Pt complains of chest pain "for months", today with shortness of breath. History of recurrent pancreatitis.  Review of Systems  Positive: Chest pain, shortness of breath Negative: Fever, cough  Physical Exam  BP (!) 149/109 (BP Location: Right Arm)   Pulse (!) 101   Temp 97.9 F (36.6 C)   Resp 18   SpO2 94%  Gen:   Awake, no distress   Resp:  Normal effort  MSK:   Moves extremities without difficulty  Other:  Abdomen soft  Medical Decision Making  Medically screening exam initiated at 8:51 PM.  Appropriate orders placed.  Anthony Sar was informed that the remainder of the evaluation will be completed by another provider, this initial triage assessment does not replace that evaluation, and the importance of remaining in the ED until their evaluation is complete.     Felicie Morn, NP 04/02/23 [phone]

## 2023-04-03 LAB — TROPONIN I (HIGH SENSITIVITY): Troponin I (High Sensitivity): 4 ng/L (ref <18)

## 2023-04-03 NOTE — ED Notes (Signed)
LWBS 

## 2023-08-11 ENCOUNTER — Emergency Department (HOSPITAL_COMMUNITY)

## 2023-08-11 ENCOUNTER — Emergency Department (HOSPITAL_COMMUNITY)
Admission: EM | Admit: 2023-08-11 | Discharge: 2023-08-11 | Disposition: A | Discharge status: Home / Self Care | Attending: Emergency Medicine | Admitting: Emergency Medicine

## 2023-08-11 ENCOUNTER — Other Ambulatory Visit: Payer: Self-pay

## 2023-08-11 ENCOUNTER — Encounter (HOSPITAL_COMMUNITY): Payer: Self-pay

## 2023-08-11 DIAGNOSIS — R319 Hematuria, unspecified: Secondary | ICD-10-CM | POA: Insufficient documentation

## 2023-08-11 DIAGNOSIS — R109 Unspecified abdominal pain: Secondary | ICD-10-CM | POA: Diagnosis not present

## 2023-08-11 DIAGNOSIS — R34 Anuria and oliguria: Secondary | ICD-10-CM | POA: Diagnosis not present

## 2023-08-11 LAB — CBC
HCT: 37.2 % — ABNORMAL LOW (ref 39.0–52.0)
Hemoglobin: 12.4 g/dL — ABNORMAL LOW (ref 13.0–17.0)
MCH: 35.1 pg — ABNORMAL HIGH (ref 26.0–34.0)
MCHC: 33.3 g/dL (ref 30.0–36.0)
MCV: 105.4 fL — ABNORMAL HIGH (ref 80.0–100.0)
Platelets: 433 10*3/uL — ABNORMAL HIGH (ref 150–400)
RBC: 3.53 MIL/uL — ABNORMAL LOW (ref 4.22–5.81)
RDW: 15.3 % (ref 11.5–15.5)
WBC: 8.3 10*3/uL (ref 4.0–10.5)
nRBC: 0 % (ref 0.0–0.2)

## 2023-08-11 LAB — COMPREHENSIVE METABOLIC PANEL WITH GFR
ALT: 15 U/L (ref 0–44)
AST: 21 U/L (ref 15–41)
Albumin: 3.2 g/dL — ABNORMAL LOW (ref 3.5–5.0)
Alkaline Phosphatase: 94 U/L (ref 38–126)
Anion gap: 11 (ref 5–15)
BUN: 6 mg/dL (ref 6–20)
CO2: 23 mmol/L (ref 22–32)
Calcium: 9.2 mg/dL (ref 8.9–10.3)
Chloride: 104 mmol/L (ref 98–111)
Creatinine, Ser: 0.92 mg/dL (ref 0.61–1.24)
GFR, Estimated: >60 mL/min (ref >60)
Glucose, Bld: 111 mg/dL — ABNORMAL HIGH (ref 70–99)
Potassium: 3.3 mmol/L — ABNORMAL LOW (ref 3.5–5.1)
Sodium: 138 mmol/L (ref 135–145)
Total Bilirubin: 0.7 mg/dL (ref 0.0–1.2)
Total Protein: 7.2 g/dL (ref 6.5–8.1)

## 2023-08-11 LAB — URINALYSIS, ROUTINE W REFLEX MICROSCOPIC
Bilirubin Urine: NEGATIVE
Glucose, UA: NEGATIVE mg/dL
Ketones, ur: 5 mg/dL — AB
Nitrite: NEGATIVE
Protein, ur: 100 mg/dL — AB
RBC / HPF: >50 RBC/hpf (ref 0–5)
Specific Gravity, Urine: 1.019 (ref 1.005–1.030)
WBC, UA: >50 WBC/hpf (ref 0–5)
pH: 6 (ref 5.0–8.0)

## 2023-08-11 LAB — LIPASE, BLOOD: Lipase: 30 U/L (ref 11–51)

## 2023-08-11 MED ORDER — CEPHALEXIN 500 MG PO CAPS: 500.0000 mg | ORAL_CAPSULE | Freq: Two times a day (BID) | ORAL | 0 refills | Status: AC

## 2023-08-11 MED ORDER — HYDROCODONE-ACETAMINOPHEN 5-325 MG PO TABS: 1.0000 | ORAL_TABLET | Freq: Four times a day (QID) | ORAL | 0 refills | Status: DC | PRN

## 2023-08-11 MED ORDER — OXYCODONE-ACETAMINOPHEN 5-325 MG PO TABS
1.0000 | ORAL_TABLET | Freq: Once | ORAL | Status: AC
  Administered 2023-08-11: 1 via ORAL
  Filled 2023-08-11: qty 1

## 2023-08-11 MED ORDER — ONDANSETRON 4 MG PO TBDP
4.0000 mg | ORAL_TABLET | Freq: Once | ORAL | Status: AC
  Administered 2023-08-11: 4 mg via ORAL
  Filled 2023-08-11: qty 1

## 2023-08-11 MED ORDER — ONDANSETRON HCL 4 MG/2ML IJ SOLN: 4.0000 mg | Freq: Once | INTRAMUSCULAR | Status: DC

## 2023-08-11 NOTE — Discharge Instructions (Addendum)
 You were seen in the ED today for blood in your urine and decreased urine output.  There were no emergency causes to your symptoms today, though you may have a UTI.  Make sure to stay well-hydrated at home.  Take Keflex  as prescribed.  Please be sure to follow-up with a urologist since blood in urine to be further investigated.

## 2023-08-11 NOTE — ED Provider Notes (Signed)
 [place] EMERGENCY DEPARTMENT AT [facility] Provider Note   CSN: [id number] Arrival date & time: 08/11/23  1542     History  Chief Complaint  Patient presents with   Flank Pain   Decreased Urination   Hematuria   HPI  [name] is a 53 y.o. male with past medical history chronic alcoholic pancreatitis, hypertension, kidney stone, congenital single kidney, HLD presents due to hematuria.  Patient states that over the last 3 weeks, he has urinated 2-3 times a week.  Volume is low, and he noted hematuria and intermittent blood clots.  He denies any fevers.  Denies any flank pain though he has been having some epigastric and left upper quadrant pain since December as he has been unable to obtain his Creon  prescription due to insurance issues.  Endorses a few episodes of vomiting intermittently with no hematemesis.  Does not take any blood thinners.   Flank Pain  Hematuria       Home Medications Prior to Admission medications   Medication Sig Start Date End Date Taking? Authorizing Provider  cephALEXin  (KEFLEX ) 500 MG capsule Take 1 capsule (500 mg total) by mouth 2 (two) times daily for 7 days. 08/11/23 08/18/23 Yes Lorain Robson, MD  acetaminophen  (TYLENOL ) 500 MG tablet Take 1,000 mg by mouth 2 (two) times daily as needed for moderate pain, fever or headache.    [provider]  albuterol  (VENTOLIN  HFA) 108 (90 Base) MCG/ACT inhaler Inhale 2 puffs into the lungs every 6 (six) hours as needed for wheezing or shortness of breath.    [provider]  carvedilol  (COREG ) 3.125 MG tablet Take 1 tablet (3.125 mg total) by mouth 2 (two) times daily with a meal. 10/23/22   Armenta Landau, MD  famotidine  (PEPCID ) 20 MG tablet Take 1 tablet (20 mg total) by mouth 2 (two) times daily for 30 days, THEN 1 tablet (20 mg total) daily. Patient taking differently: 20 mg once daily 10/23/22 11/22/23  Armenta Landau, MD  gabapentin  (NEURONTIN ) 300 MG capsule Take  300 mg by mouth 3 (three) times daily.    [provider]  HYDROcodone -acetaminophen  (NORCO/VICODIN) 5-325 MG tablet Take 1 tablet by mouth every 6 (six) hours as needed for up to 5 doses for severe pain (pain score 7-10). 08/11/23   Lorain Robson, MD  naloxone  (NARCAN ) nasal spray 4 mg/0.1 mL Place 1 spray into the nose daily as needed (opioid reversal). 07/16/22   [provider]  Naphazoline-Pheniramine (VISINE OP) Place 1 drop into both eyes daily.    [provider]  Nutritional Supplements (ENSURE ORIGINAL) LIQD Take 1 Container by mouth 3 (three) times daily.    [provider]  promethazine  (PHENERGAN ) 25 MG tablet Take 1 tablet (25 mg total) by mouth every 6 (six) hours as needed for nausea or vomiting. 03/27/23   Vada Garibaldi, MD      Allergies    Norvasc  [amlodipine  besylate], Voltaren  Carrington.Carrel ], Prilosec [omeprazole], Hydrochlorothiazide, Ultram  [tramadol ], and Prednisone     Review of Systems   Review of Systems  Genitourinary:  Positive for flank pain and hematuria.    Physical Exam Updated Vital Signs BP (!) 159/105   Pulse 71   Temp 99.1 F (37.3 C)   Resp 17   Ht 6\' 1"  (1.854 m)   Wt 73.5 kg   SpO2 100%   BMI 21.37 kg/m  Physical Exam Vitals and nursing note reviewed.  Constitutional:      General: He  is not in acute distress.    Appearance: He is well-developed.  HENT:     Head: Normocephalic and atraumatic.     Right Ear: External ear normal.     Left Ear: External ear normal.     Nose: Nose normal.     Mouth/Throat:     Mouth: Mucous membranes are moist.  Eyes:     Extraocular Movements: Extraocular movements intact.     Conjunctiva/sclera: Conjunctivae normal.  Cardiovascular:     Rate and Rhythm: Normal rate and regular rhythm.     Heart sounds: No murmur heard. Pulmonary:     Effort: Pulmonary effort is normal. No respiratory distress.     Breath sounds: Normal breath sounds.  Abdominal:     Palpations:  Abdomen is soft.     Tenderness: There is no right CVA tenderness or left CVA tenderness.     Comments: Epigastric and left upper quadrant pain to palpation, no rebound tenderness or guarding.  Musculoskeletal:        General: No swelling.     Cervical back: Neck supple.  Skin:    General: Skin is warm and dry.     Capillary Refill: Capillary refill takes less than 2 seconds.  Neurological:     Mental Status: He is alert.  Psychiatric:        Mood and Affect: Mood normal.     ED Results / Procedures / Treatments   Labs (all labs ordered are listed, but only abnormal results are displayed) Labs Reviewed  URINALYSIS, ROUTINE W REFLEX MICROSCOPIC - Abnormal; Notable for the following components:      Result Value   Color, Urine AMBER (*)    APPearance CLOUDY (*)    Hgb urine dipstick LARGE (*)    Ketones, ur 5 (*)    Protein, ur 100 (*)    Leukocytes,Ua MODERATE (*)    Bacteria, UA RARE (*)    All other components within normal limits  CBC - Abnormal; Notable for the following components:   RBC 3.53 (*)    Hemoglobin 12.4 (*)    HCT 37.2 (*)    MCV 105.4 (*)    MCH 35.1 (*)    Platelets 433 (*)    All other components within normal limits  COMPREHENSIVE METABOLIC PANEL WITH GFR - Abnormal; Notable for the following components:   Potassium 3.3 (*)    Glucose, Bld 111 (*)    Albumin 3.2 (*)    All other components within normal limits  LIPASE, BLOOD    EKG None  Radiology CT Renal Stone Study Result Date: 08/11/2023 EXAM: CT ABDOMEN AND PELVIS WITHOUT CONTRAST 08/11/2023 07:20:49 PM TECHNIQUE: CT of the abdomen and pelvis was performed Multiplanar reformatted images are provided for review. Automated exposure control, iterative reconstruction, and/or weight based adjustment of the mA/kV was utilized to reduce the radiation dose to as low as reasonably achievable. COMPARISON: 03/23/2023 CLINICAL HISTORY: Hematuria and flank pain. FINDINGS: LOWER CHEST: Mild bibasilar  scarring. HEPATOBILIARY: Status post cholecystectomy. SPLEEN: No acute abnormality. PANCREAS: Mild parenchymal calcifications in the pancreatic head, chronic. Mild peripancreatic stranding, suggesting acute pancreatitis, without well-defined fluid collection/walled off necrosis. ADRENAL GLANDS: No acute abnormality. KIDNEYS, URETERS: 10 mm nonobstructing left lower pole renal calculus, without hydronephrosis. Severe right renal cortical scarring/atrophy, presumably congenital. GI AND BOWEL: Normal appendix. Mild scattered right colon diverticulosis, without evidence of diverticulitis. PELVIS: Urinary bladder is unremarkable. PERITONEUM AND RETROPERITONEUM: No evidence of free fluid or free air. LYMPH NODES:  No evidence of lymphadenopathy. BONES AND SOFT TISSUES: No acute osseous abnormality. No focal soft tissue abnormality. Incidental adrenal and/or renal findings do not require follow up imaging. IMPRESSION: 1. Mild acute on chronic pancreatitis, without well-defined fluid collection/walled off necrosis. 2. 10 mm nonobstructing left lower pole renal calculus, without hydronephrosis. 3. Severe right renal cortical scarring/atrophy, presumably congenital. Electronically signed by: Zadie Herter MD 08/11/2023 08:40 PM EDT RP Workstation: [id number]    Procedures Procedures    Medications Ordered in ED Medications  ondansetron  (ZOFRAN -ODT) disintegrating tablet 4 mg (4 mg Oral Given 08/11/23 1832)  oxyCODONE -acetaminophen  (PERCOCET/ROXICET) 5-325 MG per tablet 1 tablet (1 tablet Oral Given 08/11/23 1833)    ED Course/ Medical Decision Making/ A&P                                 Medical Decision Making Amount and/or Complexity of Data Reviewed Labs: ordered. Radiology: ordered.  Risk Prescription drug management.   Patient is alert, afebrile, and hemodynamically stable in no acute distress.  Physical exam as noted above.  Differential includes ureterolithiasis, bladder neoplasm, AKI/ATN,  amongst other diagnoses.  Will obtain CBC, CMP, lipase, UA.  Noncontrasted CT will be obtained to assess for obstructive stone.  Of note, patient was able to provide a urine specimen promptly after being seen, which is reassuring.  Workup returned with unremarkable CBC with no leukocytosis, hemoglobin 12.4, CMP with no electrolyte derangements, BUN 6, creatinine 0.92, no transaminitis.  Lipase WNL.  Urinalysis returned with blood, leukocytes, and bacteria, inflammatory versus infectious.  Will treat for UTI with outpatient prescription for Keflex .  CT stone imaging demonstrated mild acute on chronic pancreatitis with no fluid collection or necrosis.  There is a 10 mm nonobstructing left renal pole stone with no hydronephrosis.  I looked at patient's prior CT imaging from December 2024, which had the same renal findings.  I also performed a chart review, last admission was in December for acute on chronic pancreatitis.  I do believe he has a component of chronic pancreatitis today responsible for his symptoms.  He has since run out of his Percocet.  I will give him a short prescription for this while he continues to attempt to obtain Creon  and close PCP follow-up.  We discussed the need to follow-up closely with urology given his hematuria, information placed in AVS.  He is agreeable to this plan.  Strict return precautions were given, and patient was discharged in stable condition.  Patient seen in conjunction with Dr. Ranelle Buys, who agreed with the above work-up and plan of care.        Final Clinical Impression(s) / ED Diagnoses Final diagnoses:  Hematuria, unspecified type    Rx / DC Orders ED Discharge Orders          Ordered    HYDROcodone -acetaminophen  (NORCO/VICODIN) 5-325 MG tablet  Every 6 hours PRN        08/11/23 2302    cephALEXin  (KEFLEX ) 500 MG capsule  2 times daily        08/11/23 2302              Lorain Robson, MD 08/11/23 2306    Sallyanne Creamer, DO 08/19/23  1133

## 2023-08-11 NOTE — ED Triage Notes (Signed)
 Pt came in via POV d/t Lt sided flank pain, reports he only has that one kidney (born with only one) & has been urinating less & less recently. Has noticed some bloody clots in his urine the last 2 weeks, reports one time he went 2-3 days without making urine. Also has abd pain with some n/v. Has not had his Creon  since December d/t insurance issues as well. A/Ox4, rates pain 10/10.

## 2023-09-08 ENCOUNTER — Emergency Department (HOSPITAL_COMMUNITY)
Admission: EM | Admit: 2023-09-08 | Discharge: 2023-09-09 | Disposition: A | Discharge status: Home / Self Care | Attending: Emergency Medicine | Admitting: Emergency Medicine

## 2023-09-08 ENCOUNTER — Encounter (HOSPITAL_COMMUNITY): Payer: Self-pay

## 2023-09-08 ENCOUNTER — Emergency Department (HOSPITAL_COMMUNITY)

## 2023-09-08 ENCOUNTER — Other Ambulatory Visit: Payer: Self-pay

## 2023-09-08 DIAGNOSIS — R109 Unspecified abdominal pain: Secondary | ICD-10-CM | POA: Diagnosis present

## 2023-09-08 DIAGNOSIS — K858 Other acute pancreatitis without necrosis or infection: Secondary | ICD-10-CM | POA: Diagnosis not present

## 2023-09-08 DIAGNOSIS — I10 Essential (primary) hypertension: Secondary | ICD-10-CM | POA: Diagnosis not present

## 2023-09-08 LAB — CBC
HCT: 39.3 % (ref 39.0–52.0)
Hemoglobin: 12.4 g/dL — ABNORMAL LOW (ref 13.0–17.0)
MCH: 34.5 pg — ABNORMAL HIGH (ref 26.0–34.0)
MCHC: 31.6 g/dL (ref 30.0–36.0)
MCV: 109.5 fL — ABNORMAL HIGH (ref 80.0–100.0)
Platelets: 174 10*3/uL (ref 150–400)
RBC: 3.59 MIL/uL — ABNORMAL LOW (ref 4.22–5.81)
RDW: 15.3 % (ref 11.5–15.5)
WBC: 8.9 10*3/uL (ref 4.0–10.5)
nRBC: 0.4 % — ABNORMAL HIGH (ref 0.0–0.2)

## 2023-09-08 LAB — URINALYSIS, ROUTINE W REFLEX MICROSCOPIC
Bilirubin Urine: NEGATIVE
Glucose, UA: NEGATIVE mg/dL
Ketones, ur: NEGATIVE mg/dL
Nitrite: NEGATIVE
Protein, ur: 30 mg/dL — AB
Specific Gravity, Urine: 1.002 — ABNORMAL LOW (ref 1.005–1.030)
pH: 6 (ref 5.0–8.0)

## 2023-09-08 LAB — HEPATIC FUNCTION PANEL
ALT: 20 U/L (ref 0–44)
AST: 36 U/L (ref 15–41)
Albumin: 3.6 g/dL (ref 3.5–5.0)
Alkaline Phosphatase: 73 U/L (ref 38–126)
Bilirubin, Direct: <0.1 mg/dL (ref 0.0–0.2)
Total Bilirubin: 0.4 mg/dL (ref 0.0–1.2)
Total Protein: 7.5 g/dL (ref 6.5–8.1)

## 2023-09-08 LAB — LIPASE, BLOOD: Lipase: 49 U/L (ref 11–51)

## 2023-09-08 LAB — BASIC METABOLIC PANEL WITH GFR
Anion gap: 14 (ref 5–15)
BUN: 5 mg/dL — ABNORMAL LOW (ref 6–20)
CO2: 22 mmol/L (ref 22–32)
Calcium: 9.3 mg/dL (ref 8.9–10.3)
Chloride: 99 mmol/L (ref 98–111)
Creatinine, Ser: 0.98 mg/dL (ref 0.61–1.24)
GFR, Estimated: >60 mL/min (ref >60)
Glucose, Bld: 107 mg/dL — ABNORMAL HIGH (ref 70–99)
Potassium: 3.5 mmol/L (ref 3.5–5.1)
Sodium: 135 mmol/L (ref 135–145)

## 2023-09-08 MED ORDER — IOHEXOL 350 MG/ML SOLN
75.0000 mL | Freq: Once | INTRAVENOUS | Status: AC | PRN
  Administered 2023-09-08: 75 mL via INTRAVENOUS

## 2023-09-08 MED ORDER — MORPHINE SULFATE (PF) 4 MG/ML IV SOLN
4.0000 mg | Freq: Once | INTRAVENOUS | Status: AC
  Administered 2023-09-08: 4 mg via INTRAVENOUS
  Filled 2023-09-08: qty 1

## 2023-09-08 MED ORDER — ONDANSETRON HCL 4 MG/2ML IJ SOLN
4.0000 mg | Freq: Once | INTRAMUSCULAR | Status: AC
  Administered 2023-09-08: 4 mg via INTRAVENOUS
  Filled 2023-09-08: qty 2

## 2023-09-08 NOTE — ED Provider Notes (Signed)
 [place] EMERGENCY DEPARTMENT AT [facility] Provider Note   CSN: [id number] Arrival date & time: 09/08/23  1706     History {Add pertinent medical, surgical, social history, OB history to HPI:1} Chief Complaint  Patient presents with   Flank Pain   Hematuria    left    Kyle Berry is a 53 y.o. male.  HPI     This is a 53 year old male with a history of pancreatitis and kidney stones who presents with abdominal pain, flank pain, and hematuria.  Patient states that the hematuria is more notable at night.  He has similar episode 1 month ago.  He reports chills without fevers.  He has had some urgency and frequency.  He also reports nausea and vomiting.  No challenge in bowels.  Denies alcohol use.    Home Medications Prior to Admission medications   Medication Sig Start Date End Date Taking? Authorizing Provider  acetaminophen  (TYLENOL ) 500 MG tablet Take 1,000 mg by mouth 2 (two) times daily as needed for moderate pain, fever or headache.    [provider]  albuterol  (VENTOLIN  HFA) 108 (90 Base) MCG/ACT inhaler Inhale 2 puffs into the lungs every 6 (six) hours as needed for wheezing or shortness of breath.    [provider]  carvedilol  (COREG ) 3.125 MG tablet Take 1 tablet (3.125 mg total) by mouth 2 (two) times daily with a meal. 10/23/22   Armenta Landau, MD  famotidine  (PEPCID ) 20 MG tablet Take 1 tablet (20 mg total) by mouth 2 (two) times daily for 30 days, THEN 1 tablet (20 mg total) daily. Patient taking differently: 20 mg once daily 10/23/22 11/22/23  Armenta Landau, MD  gabapentin  (NEURONTIN ) 300 MG capsule Take 300 mg by mouth 3 (three) times daily.    [provider]  HYDROcodone -acetaminophen  (NORCO/VICODIN) 5-325 MG tablet Take 1 tablet by mouth every 6 (six) hours as needed for up to 5 doses for severe pain (pain score 7-10). 08/11/23   Lorain Robson, MD  naloxone  (NARCAN ) nasal spray 4 mg/0.1 mL Place 1 spray into the  nose daily as needed (opioid reversal). 07/16/22   [provider]  Naphazoline-Pheniramine (VISINE OP) Place 1 drop into both eyes daily.    [provider]  Nutritional Supplements (ENSURE ORIGINAL) LIQD Take 1 Container by mouth 3 (three) times daily.    [provider]  promethazine  (PHENERGAN ) 25 MG tablet Take 1 tablet (25 mg total) by mouth every 6 (six) hours as needed for nausea or vomiting. 03/27/23   Vada Garibaldi, MD      Allergies    Norvasc  [amlodipine  besylate], Voltaren  [diclofenac ], Prilosec [omeprazole], Hydrochlorothiazide, Ultram  [tramadol ], and Prednisone     Review of Systems   Review of Systems  Constitutional:  Positive for chills. Negative for fever.  Respiratory:  Negative for shortness of breath.   Cardiovascular:  Negative for chest pain.  Gastrointestinal:  Positive for abdominal pain, nausea and vomiting.  Genitourinary:  Positive for flank pain and urgency.  All other systems reviewed and are negative.   Physical Exam Updated Vital Signs BP (!) 134/98 (BP Location: Left Arm)   Pulse 99   Temp 99.5 F (37.5 C)   Resp 16   Ht 1.854 m (6\' 1" )   Wt 73.5 kg   SpO2 99%   BMI 21.38 kg/m  Physical Exam Vitals and nursing note reviewed.  Constitutional:      Appearance: He is well-developed. He is not ill-appearing.  HENT:     Head: Normocephalic and atraumatic.  Eyes:     Pupils: Pupils are equal, round, and reactive to light.  Cardiovascular:     Rate and Rhythm: Normal rate and regular rhythm.     Heart sounds: Normal heart sounds. No murmur heard. Pulmonary:     Effort: Pulmonary effort is normal. No respiratory distress.     Breath sounds: Normal breath sounds. No wheezing.  Abdominal:     General: Bowel sounds are normal.     Palpations: Abdomen is soft.     Tenderness: There is abdominal tenderness. There is no right CVA tenderness, left CVA tenderness, guarding or rebound.  Musculoskeletal:     Cervical back:  Neck supple.  Lymphadenopathy:     Cervical: No cervical adenopathy.  Skin:    General: Skin is warm and dry.  Neurological:     Mental Status: He is alert and oriented to person, place, and time.  Psychiatric:        Mood and Affect: Mood normal.     ED Results / Procedures / Treatments   Labs (all labs ordered are listed, but only abnormal results are displayed) Labs Reviewed  BASIC METABOLIC PANEL WITH GFR - Abnormal; Notable for the following components:      Result Value   Glucose, Bld 107 (*)    BUN 5 (*)    All other components within normal limits  CBC - Abnormal; Notable for the following components:   RBC 3.59 (*)    Hemoglobin 12.4 (*)    MCV 109.5 (*)    MCH 34.5 (*)    nRBC 0.4 (*)    All other components within normal limits  URINALYSIS, ROUTINE W REFLEX MICROSCOPIC - Abnormal; Notable for the following components:   APPearance HAZY (*)    Specific Gravity, Urine 1.002 (*)    Hgb urine dipstick LARGE (*)    Protein, ur 30 (*)    Leukocytes,Ua SMALL (*)    Bacteria, UA FEW (*)    All other components within normal limits  HEPATIC FUNCTION PANEL  LIPASE, BLOOD    EKG None  Radiology No results found.  Procedures Procedures  {Document cardiac monitor, telemetry assessment procedure when appropriate:1}  Medications Ordered in ED Medications  morphine  (PF) 4 MG/ML injection 4 mg (has no administration in time range)  ondansetron  (ZOFRAN ) injection 4 mg (has no administration in time range)    ED Course/ Medical Decision Making/ A&P   {   Click here for ABCD2, HEART and other calculatorsREFRESH Note before signing :1}                              Medical Decision Making Amount and/or Complexity of Data Reviewed Labs: ordered. Radiology: ordered.  Risk Prescription drug management.   ***  {Document critical care time when appropriate:1} {Document review of labs and clinical decision tools ie heart score, Chads2Vasc2 etc:1}  {Document  your independent review of radiology images, and any outside records:1} {Document your discussion with family members, caretakers, and with consultants:1} {Document social determinants of health affecting pt's care:1} {Document your decision making why or why not admission, treatments were needed:1} Final Clinical Impression(s) / ED Diagnoses Final diagnoses:  None    Rx / DC Orders ED Discharge Orders     None

## 2023-09-08 NOTE — ED Triage Notes (Signed)
 Pt here for left flank pain, possible pancreatitis and hematuria. Has been going on for  about a month and getting worse. Pt compliant with abx and completed them. Still having fever chills. Urgency and frequency

## 2023-09-09 MED ORDER — OXYCODONE-ACETAMINOPHEN 5-325 MG PO TABS: 1.0000 | ORAL_TABLET | Freq: Four times a day (QID) | ORAL | 0 refills | Status: DC | PRN

## 2023-09-09 MED ORDER — ONDANSETRON 4 MG PO TBDP: 4.0000 mg | ORAL_TABLET | Freq: Three times a day (TID) | ORAL | 0 refills | Status: DC | PRN

## 2023-09-09 MED ORDER — MORPHINE SULFATE (PF) 4 MG/ML IV SOLN
4.0000 mg | Freq: Once | INTRAVENOUS | Status: AC
  Administered 2023-09-09: 4 mg via INTRAVENOUS
  Filled 2023-09-09: qty 1

## 2023-09-09 MED ORDER — SODIUM CHLORIDE 0.9 % IV BOLUS
1000.0000 mL | Freq: Once | INTRAVENOUS | Status: AC
  Administered 2023-09-09: 1000 mL via INTRAVENOUS

## 2023-09-09 NOTE — Discharge Instructions (Addendum)
 You were seen today and have evidence of some acute on chronic pancreatitis.  It is important that you take your medications.  Stay with the clear liquid diet until pain improves.  Make sure that you are staying hydrated.  Your urine was sent for culture.  If it comes back positive you will receive a phone call and receive antibiotics.

## 2023-09-11 ENCOUNTER — Encounter: Payer: Self-pay | Admitting: Urology

## 2023-09-11 ENCOUNTER — Ambulatory Visit (INDEPENDENT_AMBULATORY_CARE_PROVIDER_SITE_OTHER): Admitting: Urology

## 2023-09-11 VITALS — BP 124/83 | HR 98 | Ht 73.0 in | Wt 164.0 lb

## 2023-09-11 DIAGNOSIS — N390 Urinary tract infection, site not specified: Secondary | ICD-10-CM

## 2023-09-11 DIAGNOSIS — Q600 Renal agenesis, unilateral: Secondary | ICD-10-CM | POA: Diagnosis not present

## 2023-09-11 DIAGNOSIS — N3001 Acute cystitis with hematuria: Secondary | ICD-10-CM

## 2023-09-11 DIAGNOSIS — N200 Calculus of kidney: Secondary | ICD-10-CM

## 2023-09-11 DIAGNOSIS — R310 Gross hematuria: Secondary | ICD-10-CM | POA: Insufficient documentation

## 2023-09-11 LAB — URINALYSIS, ROUTINE W REFLEX MICROSCOPIC
Bilirubin, UA: NEGATIVE
Glucose, UA: NEGATIVE
Nitrite, UA: POSITIVE — AB
Specific Gravity, UA: 1.025 (ref 1.005–1.030)
Urobilinogen, Ur: 1 mg/dL (ref 0.2–1.0)
pH, UA: 5.5 (ref 5.0–7.5)

## 2023-09-11 LAB — MICROSCOPIC EXAMINATION: RBC, Urine: >30 /HPF — AB (ref 0–2)

## 2023-09-11 LAB — URINE CULTURE: Culture: 10000 — AB

## 2023-09-11 MED ORDER — CEFDINIR 300 MG PO CAPS: 300.0000 mg | ORAL_CAPSULE | Freq: Two times a day (BID) | ORAL | 0 refills | Status: AC

## 2023-09-11 NOTE — Progress Notes (Signed)
 Assessment: 1. Urinary tract infection with hematuria, site unspecified   2. Gross hematuria   3. Nephrolithiasis   4. Congenital single kidney     Plan: I personally reviewed the patient's chart including provider notes, lab and imaging results. I personally reviewed the CT studies from 08/11/2023 and 09/09/2023 with results as noted below. It appears he currently has a UTI which may be the cause for his gross hematuria. Begin treatment for UTI with cefdinir 300 mg twice daily x 7 days. Return to office in 3-4 weeks for reevaluation, possible cystoscopy   Chief Complaint:  Chief Complaint  Patient presents with   Hematuria    History of Present Illness:  Kyle Berry is a 53 y.o. male who is seen in consultation from [facility] for evaluation of gross hematuria and nephrolithiasis.  He was seen in the emergency room on 08/11/2023 with a 2-week history of intermittent gross hematuria.  He was not having any flank pain or dysuria.  Urinalysis showed >50 RBCs and >50 WBCs.  No culture performed.  CT abdomen and pelvis without contrast showed a 10 mm nonobstructing left lower pole calculus without hydronephrosis and severe right renal cortical scarring/atrophy.  He also had mild acute on chronic pancreatitis.  He was apparently treated with antibiotics.  He reports that the hematuria resolved.  He was again seen in the emergency room on 09/09/2023 with abdominal pain and gross hematuria.  Urinalysis showed 6-10 RBCs, 6/10 WBCs.  CT abdomen and pelvis with contrast again showed a left renal calculus and atrophic right kidney.  Urine culture grew 10,000 colonies of E. coli.  He was not treated with antibiotics.  He has not had any further episodes of gross hematuria.  He does have nocturia.  No prior history of UTIs. He does have a history of nephrolithiasis passing stones spontaneously.   Past Medical History:  Past Medical History:  Diagnosis Date    Atrophy of right kidney    Chronic bronchitis (HCC)    Chronic lower back pain    Chronic pancreatitis (HCC)    Gastroparesis 06/2020   confirmed by emptying study at [facility]   GERD (gastroesophageal reflux disease)    Headache    "once/month" (05/13/2017)   Hematemesis 10/18/2017   Hematochezia 09/19/2021   High cholesterol    Hypertension    Migraine    "a couple/year" (05/13/2017)   Nephrolithiasis 05/13/2017   Pancreatitis    Pneumonia ~ 09/2015   Presence of pancreatic duct stent    Recurrent acute pancreatitis    Shortness of breath 05/22/2021    Past Surgical History:  Past Surgical History:  Procedure Laterality Date   BIOPSY  11/02/2020   Procedure: BIOPSY;  Surgeon: Normie Becton., MD;  Location: [facility] ENDOSCOPY;  Service: Gastroenterology;;   BIOPSY  10/20/2022   Procedure: BIOPSY;  Surgeon: Normie Becton., MD;  Location: [facility] ENDOSCOPY;  Service: Gastroenterology;;   CHOLECYSTECTOMY N/A 10/23/2017   Procedure: LAPAROSCOPIC CHOLECYSTECTOMY WITH INTRAOPERATIVE CHOLANGIOGRAM;  Surgeon: Jacolyn Matar, MD;  Location: [facility] ORS;  Service: General;  Laterality: N/A;   COLONOSCOPY WITH PROPOFOL  N/A 11/02/2019   Procedure: COLONOSCOPY WITH PROPOFOL ;  Surgeon: Tobin Forts, MD;  Location: [facility] ENDOSCOPY;  Service: Endoscopy;  Laterality: N/A;   ERCP N/A 11/02/2020   Procedure: ENDOSCOPIC RETROGRADE CHOLANGIOPANCREATOGRAPHY (ERCP);  Surgeon: Normie Becton., MD;  Location: [facility] ENDOSCOPY;  Service: Gastroenterology;  Laterality: N/A;   ESOPHAGOGASTRODUODENOSCOPY N/A 11/02/2020   Procedure: ESOPHAGOGASTRODUODENOSCOPY (EGD);  Surgeon: Normie Becton., MD;  Location: [facility] ENDOSCOPY;  Service: Gastroenterology;  Laterality: N/A;   ESOPHAGOGASTRODUODENOSCOPY (EGD) WITH PROPOFOL  N/A 11/06/2019   Procedure: ESOPHAGOGASTRODUODENOSCOPY (EGD) WITH PROPOFOL ;  Surgeon: Tobin Forts, MD;  Location: [facility] ENDOSCOPY;  Service: Endoscopy;  Laterality: N/A;   ESOPHAGOGASTRODUODENOSCOPY  (EGD) WITH PROPOFOL  N/A 10/20/2022   Procedure: ESOPHAGOGASTRODUODENOSCOPY (EGD) WITH PROPOFOL ;  Surgeon: Brice Campi Albino Alu., MD;  Location: [facility] ENDOSCOPY;  Service: Gastroenterology;  Laterality: N/A;   HEMOSTASIS CLIP PLACEMENT  11/02/2019   Procedure: HEMOSTASIS CLIP PLACEMENT;  Surgeon: Tobin Forts, MD;  Location: [facility] ENDOSCOPY;  Service: Endoscopy;;   NEUROLYTIC CELIAC PLEXUS  10/20/2022   Procedure: NEUROLYTIC CELIAC PLEXUS;  Surgeon: Normie Becton., MD;  Location: [facility] ENDOSCOPY;  Service: Gastroenterology;;   NO PAST SURGERIES     REMOVAL OF STONES  11/02/2020   Procedure: REMOVAL OF STONES;  Surgeon: Normie Becton., MD;  Location: [facility] ENDOSCOPY;  Service: Gastroenterology;;   Yuvonne Herald REMOVAL  11/02/2020   Procedure: STENT REMOVAL;  Surgeon: Normie Becton., MD;  Location: [facility] ENDOSCOPY;  Service: Gastroenterology;;   UPPER ESOPHAGEAL ENDOSCOPIC ULTRASOUND (EUS) N/A 10/20/2022   Procedure: UPPER ESOPHAGEAL ENDOSCOPIC ULTRASOUND (EUS);  Surgeon: Normie Becton., MD;  Location: [facility] ENDOSCOPY;  Service: Gastroenterology;  Laterality: N/A;    Allergies:  Allergies  Allergen Reactions   Norvasc  [Amlodipine  Besylate] Other (See Comments)    Fluid buildup in chest   Voltaren  [Diclofenac ] Hives and Other (See Comments)    Fluid buildup in chest   Prilosec [Omeprazole] Other (See Comments)    MD stopped due to pancreatitis   Hydrochlorothiazide Itching   Ultram  [Tramadol ] Other (See Comments)    Stomach upset   Prednisone  Hives and Other (See Comments)    Mood swings    Family History:  Family History  Problem Relation Age of Onset   Hypertension Other    Hypertension Mother    Hypertension Father    Kidney disease Father    Hypertension Sister    Diabetes Sister    Hypertension Brother    Pancreatic cancer Paternal Grandmother    Colon cancer Cousin    Stomach cancer Neg Hx    Esophageal cancer Neg Hx     Social History:  Social History    Tobacco Use   Smoking status: Never   Smokeless tobacco: Never  Vaping Use   Vaping status: Never Used  Substance Use Topics   Alcohol use: Not Currently    Comment: remote h/o heavy use   Drug use: No    Review of symptoms:  Constitutional:  Negative for unexplained weight loss, night sweats, fever, chills ENT:  Negative for nose bleeds, sinus pain, painful swallowing CV:  Negative for chest pain, shortness of breath, exercise intolerance, palpitations, loss of consciousness Resp:  Negative for cough, wheezing, shortness of breath GI:  Negative for nausea, vomiting, diarrhea, bloody stools GU:  Positives noted in HPI; otherwise negative for gross hematuria, dysuria, urinary incontinence Neuro:  Negative for seizures, poor balance, limb weakness, slurred speech Psych:  Negative for lack of energy, depression, anxiety Endocrine:  Negative for polydipsia, polyuria, symptoms of hypoglycemia (dizziness, hunger, sweating) Hematologic:  Negative for anemia, purpura, petechia, prolonged or excessive bleeding, use of anticoagulants  Allergic:  Negative for difficulty breathing or choking as a result of exposure to anything; no shellfish allergy; no allergic response (rash/itch) to materials, foods  Physical exam: BP 124/83   Pulse 98   Ht 6\' 1"  (1.854 m)  Wt 164 lb (74.4 kg)   BMI 21.64 kg/m  GENERAL APPEARANCE:  Well appearing, well developed, well nourished, NAD HEENT: Atraumatic, Normocephalic, oropharynx clear. NECK: Supple without lymphadenopathy or thyromegaly. LUNGS: Clear to auscultation bilaterally. HEART: Regular Rate and Rhythm without murmurs, gallops, or rubs. ABDOMEN: Soft, non-tender, No Masses. EXTREMITIES: Moves all extremities well.  Without clubbing, cyanosis, or edema. NEUROLOGIC:  Alert and oriented x 3, normal gait, CN II-XII grossly intact.  MENTAL STATUS:  Appropriate. BACK:  Non-tender to palpation.  No CVAT SKIN:  Warm, dry and intact.     Results: U/A:  6-10 WBC, >30 RBC, mod bacteria, nitrite +

## 2023-09-12 ENCOUNTER — Telehealth (HOSPITAL_BASED_OUTPATIENT_CLINIC_OR_DEPARTMENT_OTHER): Payer: Self-pay | Admitting: *Deleted

## 2023-09-12 NOTE — Telephone Encounter (Signed)
 Post ED Visit - Positive Culture Follow-up  Culture report reviewed by antimicrobial stewardship pharmacist: [facility] [address] [place].D. []  Skeet Duke, Pharm.D., BCPS AQ-ID []  Leslee Rase, Pharm.D., BCPS []  Garland Junk, Pharm.D., BCPS []  [place], [address].D., BCPS, AAHIVP []  Alcide Aly, Pharm.D., BCPS, AAHIVP []  Jerri Morale, PharmD, BCPS []  Graham Laws, PharmD, BCPS []  Cleda Curly, PharmD, BCPS []  Tamar Fairly, PharmD []  Ballard Levels, PharmD, BCPS []  Ollen Beverage, PharmD  [facility] []  Arlyne Bering, PharmD []  Sherryle Don, PharmD []  Van Gelinas, PharmD []  Delila Felty, Rph []  [place]una [place]) Cleora Daft, PharmD []  Augustina Block, PharmD []  Arie Kurtz, PharmD []  Sharlyn Deaner, PharmD []  Agnes Hose, PharmD []  Kendall Pauls, PharmD []  Gladstone Lamer, PharmD []  Armanda Bern, PharmD []  Tera Fellows, PharmD   Positive urine culture No further patient follow-up is required at this time.  Lovell Rubenstein Sharion Davidson 09/12/2023, 10:18 AM

## 2023-09-23 ENCOUNTER — Telehealth: Payer: Self-pay | Admitting: Urology

## 2023-09-23 NOTE — Telephone Encounter (Signed)
 Pt scheduled 09/26/23 to come do a urinalysis.

## 2023-09-23 NOTE — Telephone Encounter (Signed)
 Pt called and state there was still blood in his urine and has taken all the antibiotics. Pt has follow up on 10/21/23. Please advise.

## 2023-09-24 ENCOUNTER — Other Ambulatory Visit: Payer: Self-pay

## 2023-09-24 DIAGNOSIS — R310 Gross hematuria: Secondary | ICD-10-CM

## 2023-09-26 ENCOUNTER — Other Ambulatory Visit

## 2023-09-26 DIAGNOSIS — R310 Gross hematuria: Secondary | ICD-10-CM

## 2023-09-26 LAB — URINALYSIS, ROUTINE W REFLEX MICROSCOPIC
Bilirubin, UA: NEGATIVE
Glucose, UA: NEGATIVE
Nitrite, UA: NEGATIVE
Specific Gravity, UA: 1.02 (ref 1.005–1.030)
Urobilinogen, Ur: 0.2 mg/dL (ref 0.2–1.0)
pH, UA: 5.5 (ref 5.0–7.5)

## 2023-09-26 LAB — MICROSCOPIC EXAMINATION: RBC, Urine: >30 /HPF — AB (ref 0–2)

## 2023-09-29 LAB — URINE CULTURE

## 2023-09-30 ENCOUNTER — Telehealth: Payer: Self-pay | Admitting: Urology

## 2023-09-30 ENCOUNTER — Ambulatory Visit: Payer: Self-pay | Admitting: Urology

## 2023-09-30 MED ORDER — LEVOFLOXACIN 500 MG PO TABS
500.0000 mg | ORAL_TABLET | Freq: Every day | ORAL | 0 refills | Status: AC
Start: 2023-09-30 — End: 2023-10-10

## 2023-09-30 NOTE — Telephone Encounter (Signed)
 Patient saw lab results yesterday (urine) in his mychart and would like someone to call him and go over them with him.

## 2023-10-21 ENCOUNTER — Encounter: Payer: Self-pay | Admitting: Urology

## 2023-10-21 ENCOUNTER — Ambulatory Visit (INDEPENDENT_AMBULATORY_CARE_PROVIDER_SITE_OTHER): Admitting: Urology

## 2023-10-21 VITALS — BP 178/118 | HR 96 | Ht 73.0 in | Wt 168.0 lb

## 2023-10-21 DIAGNOSIS — N390 Urinary tract infection, site not specified: Secondary | ICD-10-CM

## 2023-10-21 DIAGNOSIS — R310 Gross hematuria: Secondary | ICD-10-CM

## 2023-10-21 DIAGNOSIS — N200 Calculus of kidney: Secondary | ICD-10-CM

## 2023-10-21 DIAGNOSIS — Q600 Renal agenesis, unilateral: Secondary | ICD-10-CM

## 2023-10-21 DIAGNOSIS — R319 Hematuria, unspecified: Secondary | ICD-10-CM

## 2023-10-21 LAB — URINALYSIS, ROUTINE W REFLEX MICROSCOPIC
Bilirubin, UA: NEGATIVE
Glucose, UA: NEGATIVE
Leukocytes,UA: NEGATIVE
Nitrite, UA: NEGATIVE
Specific Gravity, UA: 1.025 (ref 1.005–1.030)
Urobilinogen, Ur: 1 mg/dL (ref 0.2–1.0)
pH, UA: 6 (ref 5.0–7.5)

## 2023-10-21 LAB — BLADDER SCAN AMB NON-IMAGING

## 2023-10-21 LAB — MICROSCOPIC EXAMINATION

## 2023-10-21 MED ORDER — CEPHALEXIN 250 MG PO CAPS: 250.0000 mg | ORAL_CAPSULE | Freq: Every day | ORAL | 2 refills | Status: DC

## 2023-10-21 NOTE — Progress Notes (Signed)
 Assessment: 1. Gross hematuria   2. Nephrolithiasis   3. Congenital single kidney   4. Urinary tract infection with hematuria, site unspecified     Plan: Due to his pancreatitis symptoms today, he does not wish to proceed with cystoscopy. Begin daily Keflex  250 mg for UTI prevention Return to office in 4 weeks for possible cystoscopy  Chief Complaint:  Chief Complaint  Patient presents with   Hematuria    History of Present Illness:  Kyle Berry is a 53 y.o. male who is seen for further evaluation of gross hematuria, UTI's and nephrolithiasis.  He was seen in the emergency room on 08/11/2023 with a 2-week history of intermittent gross hematuria.  He was not having any flank pain or dysuria.  Urinalysis showed >50 RBCs and >50 WBCs.  No culture performed.  CT abdomen and pelvis without contrast showed a 10 mm nonobstructing left lower pole calculus without hydronephrosis and severe right renal cortical scarring/atrophy.  He also had mild acute on chronic pancreatitis.  He was apparently treated with antibiotics.  He reports that the hematuria resolved.  He was again seen in the emergency room on 09/09/2023 with abdominal pain and gross hematuria.  Urinalysis showed 6-10 RBCs, 6-10 WBCs.  CT abdomen and pelvis with contrast again showed a left renal calculus and atrophic right kidney.  Urine culture from 09/09/23 grew 10,000 colonies of E. coli.  He was not treated with antibiotics.  He has had nocturia.  No prior history of UTIs. He does have a history of nephrolithiasis passing stones spontaneously.  He again noted gross hematuria in June 2025. Urine culture from 09/26/2023 grew  >100 K E. coli.  Treated with Levaquin  x 10 days.  He returns today for follow-up.  He has not had any further episodes of gross hematuria.  He reports lower urinary tract symptoms of frequency and nocturia.  No dysuria or flank pain.  He is having symptoms of pancreatitis with nausea vomiting and abdominal  pain. IPSS = 6.  Portions of the above documentation were copied from a prior visit for review purposes only.   Past Medical History:  Past Medical History:  Diagnosis Date   Atrophy of right kidney    Chronic bronchitis (HCC)    Chronic lower back pain    Chronic pancreatitis (HCC)    Gastroparesis 06/2020   confirmed by emptying study at [facility]   GERD (gastroesophageal reflux disease)    Headache    once/month (05/13/2017)   Hematemesis 10/18/2017   Hematochezia 09/19/2021   High cholesterol    Hypertension    Migraine    a couple/year (05/13/2017)   Nephrolithiasis 05/13/2017   Pancreatitis    Pneumonia ~ 09/2015   Presence of pancreatic duct stent    Recurrent acute pancreatitis    Shortness of breath 05/22/2021    Past Surgical History:  Past Surgical History:  Procedure Laterality Date   BIOPSY  11/02/2020   Procedure: BIOPSY;  Surgeon: Wilhelmenia Aloha Raddle., MD;  Location: [facility] ENDOSCOPY;  Service: Gastroenterology;;   BIOPSY  10/20/2022   Procedure: BIOPSY;  Surgeon: Wilhelmenia Aloha Raddle., MD;  Location: [facility] ENDOSCOPY;  Service: Gastroenterology;;   CHOLECYSTECTOMY N/A 10/23/2017   Procedure: LAPAROSCOPIC CHOLECYSTECTOMY WITH INTRAOPERATIVE CHOLANGIOGRAM;  Surgeon: Gladis Cough, MD;  Location: [facility] ORS;  Service: General;  Laterality: N/A;   COLONOSCOPY WITH PROPOFOL  N/A 11/02/2019   Procedure: COLONOSCOPY WITH PROPOFOL ;  Surgeon: Abran Norleen SAILOR, MD;  Location: [facility] ENDOSCOPY;  Service: Endoscopy;  Laterality:  N/A;   ERCP N/A 11/02/2020   Procedure: ENDOSCOPIC RETROGRADE CHOLANGIOPANCREATOGRAPHY (ERCP);  Surgeon: Wilhelmenia Aloha Raddle., MD;  Location: [facility] ENDOSCOPY;  Service: Gastroenterology;  Laterality: N/A;   ESOPHAGOGASTRODUODENOSCOPY N/A 11/02/2020   Procedure: ESOPHAGOGASTRODUODENOSCOPY (EGD);  Surgeon: Wilhelmenia Aloha Raddle., MD;  Location: [facility] ENDOSCOPY;  Service: Gastroenterology;  Laterality: N/A;   ESOPHAGOGASTRODUODENOSCOPY (EGD) WITH PROPOFOL  N/A 11/06/2019    Procedure: ESOPHAGOGASTRODUODENOSCOPY (EGD) WITH PROPOFOL ;  Surgeon: Abran Norleen SAILOR, MD;  Location: [facility] ENDOSCOPY;  Service: Endoscopy;  Laterality: N/A;   ESOPHAGOGASTRODUODENOSCOPY (EGD) WITH PROPOFOL  N/A 10/20/2022   Procedure: ESOPHAGOGASTRODUODENOSCOPY (EGD) WITH PROPOFOL ;  Surgeon: Wilhelmenia Aloha Raddle., MD;  Location: [facility] ENDOSCOPY;  Service: Gastroenterology;  Laterality: N/A;   HEMOSTASIS CLIP PLACEMENT  11/02/2019   Procedure: HEMOSTASIS CLIP PLACEMENT;  Surgeon: Abran Norleen SAILOR, MD;  Location: [facility] ENDOSCOPY;  Service: Endoscopy;;   NEUROLYTIC CELIAC PLEXUS  10/20/2022   Procedure: NEUROLYTIC CELIAC PLEXUS;  Surgeon: Wilhelmenia Aloha Raddle., MD;  Location: [facility] ENDOSCOPY;  Service: Gastroenterology;;   NO PAST SURGERIES     REMOVAL OF STONES  11/02/2020   Procedure: REMOVAL OF STONES;  Surgeon: Wilhelmenia Aloha Raddle., MD;  Location: [facility] ENDOSCOPY;  Service: Gastroenterology;;   CLEDA REMOVAL  11/02/2020   Procedure: STENT REMOVAL;  Surgeon: Wilhelmenia Aloha Raddle., MD;  Location: [facility] ENDOSCOPY;  Service: Gastroenterology;;   UPPER ESOPHAGEAL ENDOSCOPIC ULTRASOUND (EUS) N/A 10/20/2022   Procedure: UPPER ESOPHAGEAL ENDOSCOPIC ULTRASOUND (EUS);  Surgeon: Wilhelmenia Aloha Raddle., MD;  Location: [facility] ENDOSCOPY;  Service: Gastroenterology;  Laterality: N/A;    Allergies:  Allergies  Allergen Reactions   Norvasc  [Amlodipine  Besylate] Other (See Comments)    Fluid buildup in chest   Voltaren  [Diclofenac ] Hives and Other (See Comments)    Fluid buildup in chest   Prilosec [Omeprazole] Other (See Comments)    MD stopped due to pancreatitis   Hydrochlorothiazide Itching   Ultram  [Tramadol ] Other (See Comments)    Stomach upset   Prednisone  Hives and Other (See Comments)    Mood swings    Family History:  Family History  Problem Relation Age of Onset   Hypertension Other    Hypertension Mother    Hypertension Father    Kidney disease Father    Hypertension Sister    Diabetes Sister     Hypertension Brother    Pancreatic cancer Paternal Grandmother    Colon cancer Cousin    Stomach cancer Neg Hx    Esophageal cancer Neg Hx     Social History:  Social History   Tobacco Use   Smoking status: Never   Smokeless tobacco: Never  Vaping Use   Vaping status: Never Used  Substance Use Topics   Alcohol use: Not Currently    Comment: remote h/o heavy use   Drug use: No    ROS: Constitutional:  Negative for fever, chills, weight loss CV: Negative for chest pain, previous MI, hypertension Respiratory:  Negative for shortness of breath, wheezing, sleep apnea, frequent cough GI:  Negative for nausea, vomiting, bloody stool, GERD  Physical exam: BP (!) 178/118   Pulse 96   Ht 6' 1 (1.854 m)   Wt 168 lb (76.2 kg)   BMI 22.16 kg/m  GENERAL APPEARANCE:  Well appearing, well developed, well nourished, NAD HEENT:  Atraumatic, normocephalic, oropharynx clear NECK:  Supple without lymphadenopathy or thyromegaly ABDOMEN:  Soft, non-tender, no masses EXTREMITIES:  Moves all extremities well, without clubbing, cyanosis, or edema NEUROLOGIC:  Alert and oriented x 3, normal gait, CN II-XII grossly intact  MENTAL STATUS:  appropriate BACK:  Non-tender to palpation, No CVAT SKIN:  Warm, dry, and intact  Results: U/A: 0-5 WBCs, 0-2 RBCs  PVR = 0 ml

## 2023-11-18 ENCOUNTER — Encounter (HOSPITAL_COMMUNITY): Payer: Self-pay

## 2023-11-18 ENCOUNTER — Inpatient Hospital Stay (HOSPITAL_COMMUNITY)
Admission: EM | Admit: 2023-11-18 | Discharge: 2023-11-30 | DRG: 439 | Disposition: A | Discharge status: Home / Self Care | Attending: Family Medicine | Admitting: Family Medicine

## 2023-11-18 ENCOUNTER — Emergency Department (HOSPITAL_COMMUNITY)

## 2023-11-18 ENCOUNTER — Other Ambulatory Visit: Payer: Self-pay

## 2023-11-18 DIAGNOSIS — Z789 Other specified health status: Secondary | ICD-10-CM

## 2023-11-18 DIAGNOSIS — K859 Acute pancreatitis without necrosis or infection, unspecified: Secondary | ICD-10-CM | POA: Diagnosis present

## 2023-11-18 DIAGNOSIS — R319 Hematuria, unspecified: Secondary | ICD-10-CM | POA: Diagnosis present

## 2023-11-18 DIAGNOSIS — F1011 Alcohol abuse, in remission: Secondary | ICD-10-CM | POA: Diagnosis present

## 2023-11-18 DIAGNOSIS — K8689 Other specified diseases of pancreas: Secondary | ICD-10-CM | POA: Diagnosis present

## 2023-11-18 DIAGNOSIS — Z87442 Personal history of urinary calculi: Secondary | ICD-10-CM | POA: Diagnosis not present

## 2023-11-18 DIAGNOSIS — E440 Moderate protein-calorie malnutrition: Secondary | ICD-10-CM | POA: Diagnosis present

## 2023-11-18 DIAGNOSIS — Z800 Family history of malignant neoplasm of digestive organs: Secondary | ICD-10-CM

## 2023-11-18 DIAGNOSIS — Z841 Family history of disorders of kidney and ureter: Secondary | ICD-10-CM

## 2023-11-18 DIAGNOSIS — Z833 Family history of diabetes mellitus: Secondary | ICD-10-CM

## 2023-11-18 DIAGNOSIS — K3184 Gastroparesis: Secondary | ICD-10-CM | POA: Diagnosis present

## 2023-11-18 DIAGNOSIS — Z79899 Other long term (current) drug therapy: Secondary | ICD-10-CM | POA: Diagnosis not present

## 2023-11-18 DIAGNOSIS — I10 Essential (primary) hypertension: Secondary | ICD-10-CM | POA: Diagnosis present

## 2023-11-18 DIAGNOSIS — K219 Gastro-esophageal reflux disease without esophagitis: Secondary | ICD-10-CM | POA: Diagnosis present

## 2023-11-18 DIAGNOSIS — N261 Atrophy of kidney (terminal): Secondary | ICD-10-CM | POA: Diagnosis present

## 2023-11-18 DIAGNOSIS — K861 Other chronic pancreatitis: Secondary | ICD-10-CM | POA: Diagnosis present

## 2023-11-18 DIAGNOSIS — K8681 Exocrine pancreatic insufficiency: Secondary | ICD-10-CM | POA: Diagnosis present

## 2023-11-18 DIAGNOSIS — E7800 Pure hypercholesterolemia, unspecified: Secondary | ICD-10-CM | POA: Diagnosis present

## 2023-11-18 DIAGNOSIS — D539 Nutritional anemia, unspecified: Secondary | ICD-10-CM | POA: Diagnosis present

## 2023-11-18 DIAGNOSIS — J42 Unspecified chronic bronchitis: Secondary | ICD-10-CM | POA: Diagnosis present

## 2023-11-18 DIAGNOSIS — Z9049 Acquired absence of other specified parts of digestive tract: Secondary | ICD-10-CM | POA: Diagnosis not present

## 2023-11-18 DIAGNOSIS — Z6822 Body mass index (BMI) 22.0-22.9, adult: Secondary | ICD-10-CM | POA: Diagnosis not present

## 2023-11-18 DIAGNOSIS — Z8701 Personal history of pneumonia (recurrent): Secondary | ICD-10-CM

## 2023-11-18 DIAGNOSIS — Z87898 Personal history of other specified conditions: Secondary | ICD-10-CM | POA: Diagnosis not present

## 2023-11-18 DIAGNOSIS — Z87440 Personal history of urinary (tract) infections: Secondary | ICD-10-CM

## 2023-11-18 DIAGNOSIS — Z8249 Family history of ischemic heart disease and other diseases of the circulatory system: Secondary | ICD-10-CM

## 2023-11-18 LAB — CBC
HCT: 37.8 % — ABNORMAL LOW (ref 39.0–52.0)
Hemoglobin: 12.9 g/dL — ABNORMAL LOW (ref 13.0–17.0)
MCH: 34.9 pg — ABNORMAL HIGH (ref 26.0–34.0)
MCHC: 34.1 g/dL (ref 30.0–36.0)
MCV: 102.2 fL — ABNORMAL HIGH (ref 80.0–100.0)
Platelets: 180 K/uL (ref 150–400)
RBC: 3.7 MIL/uL — ABNORMAL LOW (ref 4.22–5.81)
RDW: 17.1 % — ABNORMAL HIGH (ref 11.5–15.5)
WBC: 5.2 K/uL (ref 4.0–10.5)
nRBC: 0 % (ref 0.0–0.2)

## 2023-11-18 LAB — COMPREHENSIVE METABOLIC PANEL WITH GFR
ALT: 28 U/L (ref 0–44)
AST: 51 U/L — ABNORMAL HIGH (ref 15–41)
Albumin: 3.6 g/dL (ref 3.5–5.0)
Alkaline Phosphatase: 73 U/L (ref 38–126)
Anion gap: 13 (ref 5–15)
BUN: <5 mg/dL — ABNORMAL LOW (ref 6–20)
CO2: 25 mmol/L (ref 22–32)
Calcium: 9 mg/dL (ref 8.9–10.3)
Chloride: 100 mmol/L (ref 98–111)
Creatinine, Ser: 1.12 mg/dL (ref 0.61–1.24)
GFR, Estimated: >60 mL/min (ref >60)
Glucose, Bld: 105 mg/dL — ABNORMAL HIGH (ref 70–99)
Potassium: 3.1 mmol/L — ABNORMAL LOW (ref 3.5–5.1)
Sodium: 138 mmol/L (ref 135–145)
Total Bilirubin: 1 mg/dL (ref 0.0–1.2)
Total Protein: 7.1 g/dL (ref 6.5–8.1)

## 2023-11-18 LAB — URINALYSIS, ROUTINE W REFLEX MICROSCOPIC
Bilirubin Urine: NEGATIVE
Glucose, UA: NEGATIVE mg/dL
Hgb urine dipstick: NEGATIVE
Ketones, ur: NEGATIVE mg/dL
Leukocytes,Ua: NEGATIVE
Nitrite: NEGATIVE
Protein, ur: NEGATIVE mg/dL
Specific Gravity, Urine: 1.003 — ABNORMAL LOW (ref 1.005–1.030)
pH: 6 (ref 5.0–8.0)

## 2023-11-18 LAB — HIV ANTIBODY (ROUTINE TESTING W REFLEX): HIV Screen 4th Generation wRfx: NONREACTIVE

## 2023-11-18 LAB — LIPASE, BLOOD: Lipase: 34 U/L (ref 11–51)

## 2023-11-18 MED ORDER — ENOXAPARIN SODIUM 40 MG/0.4ML IJ SOSY
40.0000 mg | PREFILLED_SYRINGE | INTRAMUSCULAR | Status: DC
  Administered 2023-11-18 – 2023-11-23 (×6): 40 mg via SUBCUTANEOUS
  Filled 2023-11-18 (×7): qty 0.4

## 2023-11-18 MED ORDER — PROCHLORPERAZINE EDISYLATE 10 MG/2ML IJ SOLN
10.0000 mg | Freq: Once | INTRAMUSCULAR | Status: AC
  Administered 2023-11-18: 10 mg via INTRAVENOUS
  Filled 2023-11-18: qty 2

## 2023-11-18 MED ORDER — POTASSIUM CHLORIDE 10 MEQ/100ML IV SOLN
10.0000 meq | INTRAVENOUS | Status: AC
  Administered 2023-11-18 (×3): 10 meq via INTRAVENOUS
  Filled 2023-11-18 (×3): qty 100

## 2023-11-18 MED ORDER — METOCLOPRAMIDE HCL 5 MG/ML IJ SOLN
10.0000 mg | Freq: Once | INTRAMUSCULAR | Status: AC
  Administered 2023-11-18: 10 mg via INTRAVENOUS
  Filled 2023-11-18: qty 2

## 2023-11-18 MED ORDER — LACTATED RINGERS IV SOLN: INTRAVENOUS | Status: AC

## 2023-11-18 MED ORDER — HYDROMORPHONE HCL 1 MG/ML IJ SOLN
0.5000 mg | INTRAMUSCULAR | Status: DC | PRN
  Administered 2023-11-18 – 2023-11-20 (×8): 0.5 mg via INTRAVENOUS
  Filled 2023-11-18 (×8): qty 0.5

## 2023-11-18 MED ORDER — ONDANSETRON HCL 4 MG/2ML IJ SOLN
4.0000 mg | Freq: Four times a day (QID) | INTRAMUSCULAR | Status: DC | PRN
  Administered 2023-11-19: 4 mg via INTRAVENOUS
  Filled 2023-11-18: qty 2

## 2023-11-18 MED ORDER — POTASSIUM CHLORIDE CRYS ER 20 MEQ PO TBCR
40.0000 meq | EXTENDED_RELEASE_TABLET | Freq: Once | ORAL | Status: AC
  Administered 2023-11-18: 40 meq via ORAL
  Filled 2023-11-18: qty 2

## 2023-11-18 MED ORDER — SODIUM CHLORIDE 0.9 % IV BOLUS
1000.0000 mL | Freq: Once | INTRAVENOUS | Status: AC
  Administered 2023-11-18: 1000 mL via INTRAVENOUS

## 2023-11-18 MED ORDER — HYDROMORPHONE HCL 1 MG/ML IJ SOLN
0.5000 mg | Freq: Once | INTRAMUSCULAR | Status: AC
  Administered 2023-11-18: 0.5 mg via INTRAVENOUS
  Filled 2023-11-18: qty 1

## 2023-11-18 MED ORDER — IOHEXOL 350 MG/ML SOLN
60.0000 mL | Freq: Once | INTRAVENOUS | Status: AC | PRN
  Administered 2023-11-18: 60 mL via INTRAVENOUS

## 2023-11-18 MED ORDER — ENOXAPARIN SODIUM 30 MG/0.3ML IJ SOSY: 30.0000 mg | PREFILLED_SYRINGE | INTRAMUSCULAR | Status: DC

## 2023-11-18 MED ORDER — OXYCODONE HCL 5 MG PO TABS
5.0000 mg | ORAL_TABLET | ORAL | Status: DC | PRN
  Administered 2023-11-18: 5 mg via ORAL
  Filled 2023-11-18 (×2): qty 1

## 2023-11-18 NOTE — Assessment & Plan Note (Addendum)
-   Admit to [facility], med-surg, attending Dr. Donzetta - Vital signs per floor - NPO - Zofran  4 mg IV Q6H PRN for N/V - VTE prophylaxis : Lovenox  - Fluids : IVF LR 100 ml/hr while NPO  - Continue antibiotics  - AM CBC/BMP, mg  - Hold PO home medications until tolerated diet

## 2023-11-18 NOTE — ED Provider Notes (Signed)
 [place] EMERGENCY DEPARTMENT AT [facility] Provider Note  CSN: [id number] Arrival date & time: 11/18/23 9053  Chief Complaint(s) Abdominal Pain  HPI OSA CAMPOLI is a 53 y.o. male history of chronic pancreatitis, gastroparesis, single kidney presenting to the emergency department with abdominal pain.  Patient reports pain in the epigastric region, radiated to the back.  Reports associated nausea and vomiting.  Reports this is somewhat chronic but worse recently over the past few days.  Reports some subjective fevers and chills.  Denies any urinary symptoms.  Flank pain was reported in triage but patient denies this to me.  Denies lower abdominal pain also.  Reports some diarrhea.  No chest pain or shortness of breath.   Past Medical History Past Medical History:  Diagnosis Date   Atrophy of right kidney    Chronic bronchitis (HCC)    Chronic lower back pain    Chronic pancreatitis (HCC)    Gastroparesis 06/2020   confirmed by emptying study at [facility]   GERD (gastroesophageal reflux disease)    Headache    once/month (05/13/2017)   Hematemesis 10/18/2017   Hematochezia 09/19/2021   High cholesterol    Hypertension    Migraine    a couple/year (05/13/2017)   Nephrolithiasis 05/13/2017   Pancreatitis    Pneumonia ~ 09/2015   Presence of pancreatic duct stent    Recurrent acute pancreatitis    Shortness of breath 05/22/2021   Patient Active Problem List   Diagnosis Date Noted   Gross hematuria 09/11/2023   Pancreatitis 03/23/2023   AKI (acute kidney injury) (HCC) 01/12/2023   Other chronic pain 10/19/2022   Chronic alcoholic pancreatitis (HCC) 10/19/2022   Chronic abdominal pain 10/18/2022   Mild protein-calorie malnutrition (HCC) 10/18/2022   Alcohol-induced pancreatitis 10/12/2022   Colonic ulcer    Pancreatic insufficiency 05/28/2019   Pancreatic divisum    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 Authorizing Provider  acetaminophen  (TYLENOL ) 500 MG tablet Take 1,000 mg by mouth 2 (two) times daily as needed for moderate pain, fever or headache.    [provider]  albuterol  (VENTOLIN  HFA) 108 (90 Base) MCG/ACT inhaler Inhale 2 puffs into the lungs every 6 (six) hours as needed for wheezing or shortness of breath.    [provider]  carvedilol  (COREG ) 3.125 MG tablet Take 1 tablet (3.125 mg total) by mouth 2 (two) times daily with a meal. 10/23/22   [name] Toribio GAILS, MD  cephALEXin  (KEFLEX ) 250 MG capsule Take 1 capsule (250 mg total) by mouth daily. 10/21/23   Stoneking, Adine PARAS., MD  famotidine  (PEPCID ) 20 MG tablet Take 1 tablet (20 mg total) by mouth 2 (two) times daily for 30 days, THEN 1 tablet (20 mg total) daily. 10/23/22 11/22/23  [name] Toribio GAILS, MD  gabapentin  (NEURONTIN ) 300 MG capsule Take 300 mg by mouth 3 (three) times daily.    [provider]  HYDROcodone -acetaminophen  (NORCO/VICODIN) 5-325 MG tablet Take 1 tablet by mouth every 6 (six) hours as needed for up to 5 doses for severe pain (pain score 7-10). 08/11/23   Edris Palma, MD  naloxone  (NARCAN ) nasal spray 4 mg/0.1 mL Place 1 spray into the nose daily as needed (opioid  reversal). 07/16/22   [provider]  Naphazoline-Pheniramine (VISINE OP) Place 1 drop into both eyes daily.    [provider]  Nutritional Supplements (ENSURE ORIGINAL) LIQD Take 1 Container by mouth 3 (three) times daily.    [provider]  ondansetron  (ZOFRAN -ODT) 4 MG disintegrating tablet Take 1 tablet (4 mg total) by mouth every 8 (eight) hours as needed for nausea or vomiting. 09/09/23   Horton, Charmaine FALCON, MD  oxyCODONE -acetaminophen  (PERCOCET/ROXICET)  5-325 MG tablet Take 1 tablet by mouth every 6 (six) hours as needed for severe pain (pain score 7-10). 09/09/23   Horton, Charmaine FALCON, MD  promethazine  (PHENERGAN ) 25 MG tablet Take 1 tablet (25 mg total) by mouth every 6 (six) hours as needed for nausea or vomiting. 03/27/23   Raenelle Coria, MD                                                                                                                                    Past Surgical History Past Surgical History:  Procedure Laterality Date   BIOPSY  11/02/2020   Procedure: BIOPSY;  Surgeon: Wilhelmenia Aloha Raddle., MD;  Location: [facility] ENDOSCOPY;  Service: Gastroenterology;;   BIOPSY  10/20/2022   Procedure: BIOPSY;  Surgeon: Wilhelmenia Aloha Raddle., MD;  Location: [facility] ENDOSCOPY;  Service: Gastroenterology;;   CHOLECYSTECTOMY N/A 10/23/2017   Procedure: LAPAROSCOPIC CHOLECYSTECTOMY WITH INTRAOPERATIVE CHOLANGIOGRAM;  Surgeon: Gladis Cough, MD;  Location: [facility] ORS;  Service: General;  Laterality: N/A;   COLONOSCOPY WITH PROPOFOL  N/A 11/02/2019   Procedure: COLONOSCOPY WITH PROPOFOL ;  Surgeon: Abran Norleen SAILOR, MD;  Location: [facility] ENDOSCOPY;  Service: Endoscopy;  Laterality: N/A;   ERCP N/A 11/02/2020   Procedure: ENDOSCOPIC RETROGRADE CHOLANGIOPANCREATOGRAPHY (ERCP);  Surgeon: Wilhelmenia Aloha Raddle., MD;  Location: [facility] ENDOSCOPY;  Service: Gastroenterology;  Laterality: N/A;   ESOPHAGOGASTRODUODENOSCOPY N/A 11/02/2020   Procedure: ESOPHAGOGASTRODUODENOSCOPY (EGD);  Surgeon: Wilhelmenia Aloha Raddle., MD;  Location: [facility] ENDOSCOPY;  Service: Gastroenterology;  Laterality: N/A;   ESOPHAGOGASTRODUODENOSCOPY (EGD) WITH PROPOFOL  N/A 11/06/2019   Procedure: ESOPHAGOGASTRODUODENOSCOPY (EGD) WITH PROPOFOL ;  Surgeon: Abran Norleen SAILOR, MD;  Location: [facility] ENDOSCOPY;  Service: Endoscopy;  Laterality: N/A;   ESOPHAGOGASTRODUODENOSCOPY (EGD) WITH PROPOFOL  N/A 10/20/2022   Procedure: ESOPHAGOGASTRODUODENOSCOPY (EGD) WITH PROPOFOL ;  Surgeon: Wilhelmenia Aloha Raddle., MD;  Location:  [facility] ENDOSCOPY;  Service: Gastroenterology;  Laterality: N/A;   HEMOSTASIS CLIP PLACEMENT  11/02/2019   Procedure: HEMOSTASIS CLIP PLACEMENT;  Surgeon: Abran Norleen SAILOR, MD;  Location: [facility] ENDOSCOPY;  Service: Endoscopy;;   NEUROLYTIC CELIAC PLEXUS  10/20/2022   Procedure: NEUROLYTIC CELIAC PLEXUS;  Surgeon: Wilhelmenia Aloha Raddle., MD;  Location: [facility] ENDOSCOPY;  Service: Gastroenterology;;   NO PAST SURGERIES     REMOVAL OF STONES  11/02/2020   Procedure: REMOVAL OF STONES;  Surgeon: Wilhelmenia Aloha Raddle., MD;  Location: [facility] ENDOSCOPY;  Service: Gastroenterology;;   CLEDA REMOVAL  11/02/2020   Procedure: STENT REMOVAL;  Surgeon: Wilhelmenia Aloha Raddle., MD;  Location: [facility] ENDOSCOPY;  Service: Gastroenterology;;   UPPER ESOPHAGEAL ENDOSCOPIC ULTRASOUND (EUS) N/A 10/20/2022   Procedure: UPPER ESOPHAGEAL ENDOSCOPIC ULTRASOUND (EUS);  Surgeon: Wilhelmenia Aloha Raddle., MD;  Location: [facility] ENDOSCOPY;  Service: Gastroenterology;  Laterality: N/A;   Family History Family History  Problem Relation Age of Onset   Hypertension Other    Hypertension Mother    Hypertension Father    Kidney disease Father    Hypertension Sister    Diabetes Sister    Hypertension Brother    Pancreatic cancer Paternal Grandmother    Colon cancer Cousin    Stomach cancer Neg Hx    Esophageal cancer Neg Hx     Social History Social History   Tobacco Use   Smoking status: Never   Smokeless tobacco: Never  Vaping Use   Vaping status: Never Used  Substance Use Topics   Alcohol use: Not Currently    Comment: remote h/o heavy use   Drug use: No   Allergies Norvasc  [amlodipine  besylate], Voltaren  [diclofenac ], Prilosec [omeprazole], Hydrochlorothiazide, Ultram  [tramadol ], and Prednisone   Review of Systems Review of Systems  All other systems reviewed and are negative.   Physical Exam Vital Signs  I have reviewed the triage vital signs BP (!) 162/103   Pulse (!) 57   Temp 98 F (36.7 C)   Resp 18   SpO2 100%   Physical Exam Vitals and nursing note reviewed.  Constitutional:      General: He is not in acute distress.    Appearance: Normal appearance.  HENT:     Mouth/Throat:     Mouth: Mucous membranes are moist.  Eyes:     Conjunctiva/sclera: Conjunctivae normal.  Cardiovascular:     Rate and Rhythm: Normal rate and regular rhythm.  Pulmonary:     Effort: Pulmonary effort is normal. No respiratory distress.     Breath sounds: Normal breath sounds.  Abdominal:     General: Abdomen is flat.     Palpations: Abdomen is soft.     Tenderness: There is abdominal tenderness in the epigastric area. There is no right CVA tenderness or left CVA tenderness.  Musculoskeletal:     Right lower leg: No edema.     Left lower leg: No edema.  Skin:    General: Skin is warm and dry.     Capillary Refill: Capillary refill takes less than 2 seconds.  Neurological:     Mental Status: He is alert and oriented to person, place, and time. Mental status is at baseline.  Psychiatric:        Mood and Affect: Mood normal.        Behavior: Behavior normal.     ED Results and Treatments Labs (all labs ordered are listed, but only abnormal results are displayed) Labs Reviewed  COMPREHENSIVE METABOLIC PANEL WITH GFR - Abnormal; Notable for the following components:      Result Value   Potassium 3.1 (*)    Glucose, Bld 105 (*)    BUN <5 (*)    AST 51 (*)    All other components within normal limits  CBC - Abnormal; Notable for the following components:   RBC 3.70 (*)    Hemoglobin 12.9 (*)    HCT 37.8 (*)    MCV 102.2 (*)    MCH 34.9 (*)    RDW 17.1 (*)    All other components within normal limits  URINALYSIS, ROUTINE W REFLEX MICROSCOPIC - Abnormal; Notable for the following components:  Color, Urine STRAW (*)    Specific Gravity, Urine 1.003 (*)    All other components within normal limits  LIPASE, BLOOD                                                                                                                           Radiology CT ABDOMEN PELVIS W CONTRAST Result Date: 11/18/2023 CLINICAL DATA:  Abdominal pain. EXAM: CT ABDOMEN AND PELVIS WITH CONTRAST TECHNIQUE: Multidetector CT imaging of the abdomen and pelvis was performed using the standard protocol following bolus administration of intravenous contrast. RADIATION DOSE REDUCTION: This exam was performed according to the departmental dose-optimization program which includes automated exposure control, adjustment of the mA and/or kV according to patient size and/or use of iterative reconstruction technique. CONTRAST:  60mL OMNIPAQUE  IOHEXOL  350 MG/ML SOLN COMPARISON:  CT abdomen pelvis dated 09/08/2023. FINDINGS: Lower chest: The visualized lung bases are clear. No intra-abdominal free air or free fluid. Hepatobiliary: The liver is unremarkable. There is mild biliary dilatation, post cholecystectomy. Pancreas: Sequela of chronic pancreatitis with small scattered pancreatic calcifications and mild gland atrophy. There is inflammatory changes of the pancreas consistent with acute on chronic pancreatitis. No abscess or pseudocyst. Spleen: Normal in size without focal abnormality. Adrenals/Urinary Tract: The adrenal glands are unremarkable. Atrophic right kidney. There is a 5 mm nonobstructing left renal inferior pole calculus. No hydronephrosis. The visualized ureters appear unremarkable. The urinary bladder is minimally distended and grossly unremarkable. Stomach/Bowel: There is no bowel obstruction or active inflammation. The appendix is normal. Vascular/Lymphatic: The abdominal aorta and IVC unremarkable. No portal venous gas. There is no adenopathy. Reproductive: The prostate and seminal vesicles are grossly unremarkable. No pelvic mass. Other: None Musculoskeletal: Degenerative changes at L5-S1. No acute osseous pathology. IMPRESSION: 1. Acute on chronic pancreatitis. No abscess or pseudocyst. 2. Atrophic right kidney. 3. A 5 mm nonobstructing  left renal inferior pole calculus. No hydronephrosis. 4. No bowel obstruction. Normal appendix. Electronically Signed   By: Vanetta Chou M.D.   On: 11/18/2023 12:53    Pertinent labs & imaging results that were available during my care of the patient were reviewed by me and considered in my medical decision making (see MDM for details).  Medications Ordered in ED Medications  potassium chloride  SA (KLOR-CON  M) CR tablet 40 mEq (has no administration in time range)  HYDROmorphone  (DILAUDID ) injection 0.5 mg (0.5 mg Intravenous Given 11/18/23 1157)  sodium chloride  0.9 % bolus 1,000 mL (0 mLs Intravenous Stopped 11/18/23 1328)  metoCLOPramide  (REGLAN ) injection 10 mg (10 mg Intravenous Given 11/18/23 1156)  iohexol  (OMNIPAQUE ) 350 MG/ML injection 60 mL (60 mLs Intravenous Contrast Given 11/18/23 1243)  HYDROmorphone  (DILAUDID ) injection 0.5 mg (0.5 mg Intravenous Given 11/18/23 1326)  Procedures Procedures  (including critical care time)  Medical Decision Making / ED Course   MDM:  53 year old presenting to the emergency department with abdominal pain.  Patient overall well-appearing, physical examination with no focal finding other than epigastric tenderness.  No CVA tenderness.  Suspect likely acute on chronic pancreatitis, will check labs including lipase, CMP.  Does have history of UTI and although no CVA tenderness we will check urinalysis.  Will obtain CT abdomen pelvis given tenderness to evaluate for any acute process.  Will reassess.  Clinical Course as of 11/18/23 1451  Mon Nov 18, 2023  1449 Workup with signs of acute on chronic pancreatitis on CT scan. Has received a few doses of pain control and doesn't feel like his pain is well controlled. Discussed with internal medicine teaching service who will admit patient.   [WS]    Clinical Course User  Index [WS] Francesca Elsie CROME, MD     Additional history obtained:  -External records from outside source obtained and reviewed including: Chart review including previous notes, labs, imaging, consultation notes including prior er visits for similar    Lab Tests: -I ordered, reviewed, and interpreted labs.   The pertinent results include:   Labs Reviewed  COMPREHENSIVE METABOLIC PANEL WITH GFR - Abnormal; Notable for the following components:      Result Value   Potassium 3.1 (*)    Glucose, Bld 105 (*)    BUN <5 (*)    AST 51 (*)    All other components within normal limits  CBC - Abnormal; Notable for the following components:   RBC 3.70 (*)    Hemoglobin 12.9 (*)    HCT 37.8 (*)    MCV 102.2 (*)    MCH 34.9 (*)    RDW 17.1 (*)    All other components within normal limits  URINALYSIS, ROUTINE W REFLEX MICROSCOPIC - Abnormal; Notable for the following components:   Color, Urine STRAW (*)    Specific Gravity, Urine 1.003 (*)    All other components within normal limits  LIPASE, BLOOD    Notable for mild anemia, mild hypokalemia     Imaging Studies ordered: I ordered imaging studies including CT A/P  On my interpretation imaging demonstrates acute on chronic pancreatitis  I independently visualized and interpreted imaging. I agree with the radiologist interpretation   Medicines ordered and prescription drug management: Meds ordered this encounter  Medications   HYDROmorphone  (DILAUDID ) injection 0.5 mg   sodium chloride  0.9 % bolus 1,000 mL   metoCLOPramide  (REGLAN ) injection 10 mg   iohexol  (OMNIPAQUE ) 350 MG/ML injection 60 mL   HYDROmorphone  (DILAUDID ) injection 0.5 mg   potassium chloride  SA (KLOR-CON  M) CR tablet 40 mEq    -I have reviewed the patients home medicines and have made adjustments as needed   Consultations Obtained: I requested consultation with the IM Teaching service ,  and discussed lab and imaging findings as well as pertinent plan -  they recommend: admission   Cardiac Monitoring: The patient was maintained on a cardiac monitor.  I personally viewed and interpreted the cardiac monitored which showed an underlying rhythm of: NSR  Reevaluation: After the interventions noted above, I reevaluated the patient and found that their symptoms have stayed the same  Co morbidities that complicate the patient evaluation  Past Medical History:  Diagnosis Date   Atrophy of right kidney    Chronic bronchitis (HCC)    Chronic lower back pain    Chronic pancreatitis (HCC)  Gastroparesis 06/2020   confirmed by emptying study at [facility]   GERD (gastroesophageal reflux disease)    Headache    once/month (05/13/2017)   Hematemesis 10/18/2017   Hematochezia 09/19/2021   High cholesterol    Hypertension    Migraine    a couple/year (05/13/2017)   Nephrolithiasis 05/13/2017   Pancreatitis    Pneumonia ~ 09/2015   Presence of pancreatic duct stent    Recurrent acute pancreatitis    Shortness of breath 05/22/2021      Dispostion: Disposition decision including need for hospitalization was considered, and patient admitted to the hospital.    Final Clinical Impression(s) / ED Diagnoses Final diagnoses:  Acute on chronic pancreatitis [facility])     This chart was dictated using voice recognition software.  Despite best efforts to proofread,  errors can occur which can change the documentation meaning.    Francesca Elsie CROME, MD 11/18/23 [phone]

## 2023-11-18 NOTE — ED Triage Notes (Addendum)
 Pt c/o lower abd and bilateral flank pain x several months; endorses fevers and N/V; hx pancreatitis; states has been out of creon  for 5-6 months, states he only has 1 kidney

## 2023-11-18 NOTE — Assessment & Plan Note (Signed)
 GERD: Restart famotidine  once able to p.o. Hypertension: Restart carvedilol  once able to p.o.

## 2023-11-18 NOTE — Assessment & Plan Note (Signed)
 Last drink reportedly 2 months ago, likely low risk. - CIWA without Ativan  out of precaution

## 2023-11-18 NOTE — Plan of Care (Signed)
 FMTS Interim Progress Note  S: Evaluated patient with Dr. CHARLENA Pinal. Patient states his epigastric pain has improved overall since admission but persists and radiates into his back. He reports 1 episode of non-bloody emesis just before we walked into the room. Has been given Zofran  without relief. Patient states Compazine  has worked well for him in the past. Will trial compazine  for N/V relief and then PO oxycodone  if tolerated.   O: lying supine in bed Severe TTP of epigastric area BP (!) 175/106 (BP Location: Right Arm)   Pulse 62   Temp 98.3 F (36.8 C) (Oral)   Resp 16   SpO2 97%     A/P: Acute on Chronic Pancreatitis -elevated BP most likely secondary to pain -with recent episode of emesis, trial compazine  then PO oxy -advance diet as tolerated -K+ repletion ongoing, continue to monitor K+ and switch to PO once able to tolerate  Lupie Credit, DO 11/18/2023, 7:27 PM PGY-1, [facility] Family Medicine Service pager [phone]

## 2023-11-18 NOTE — ED Notes (Signed)
 Charge notified patient coming up

## 2023-11-18 NOTE — Assessment & Plan Note (Signed)
 Restart pancreatic enzymes once able to p.o. - Will discuss with our pharmacist about cost of pancreatic enzymes

## 2023-11-18 NOTE — H&P (Addendum)
 Hospital Admission History and Physical Service Pager: [phone]  Patient name: Kyle Berry Medical record number: [id number] Date of Birth: Jan 19, 1971 Age: 53 y.o. Gender: male  Primary Care Provider: [facility] Consultants: None Code Status: Full Code which was confirmed with the patient  Preferred Emergency Contact: Onofrio Klemp ( Mother ) : [phone]  Chief Complaint: abdominal pain   Differential and Medical Decision Making Kyle Berry is a 53 y.o. male presenting with epigastric abdominal pain. CT abdominal w/ contrast show acute on Chronic pancreatitis .   Differential for this patient's presentation of this includes  Pancreatitis : CT abdominal and pelvis w/ contrast shows Acute on chronic pancreatitis. No abscess or pseudocyst.  Gallstone : less likely w/ history of ERCP w/ stent and s/p Cholecystectomy Assessment & Plan Acute on chronic pancreatitis (HCC) - Admit to FMTS, med-surg, attending Dr. Donzetta - Vital signs per floor - NPO - Zofran  4 mg IV Q6H PRN for N/V - VTE prophylaxis : Lovenox  - Fluids : IVF LR 100 ml/hr while NPO  - Continue antibiotics  - AM CBC/BMP, mg  - Hold PO home medications until tolerated diet  History of alcohol abuse Last drink reportedly 2 months ago, likely low risk. - CIWA without Ativan  out of precaution Pancreatic insufficiency Restart pancreatic enzymes once able to p.o. - Will discuss with our pharmacist about cost of pancreatic enzymes Chronic health problem GERD: Restart famotidine  once able to p.o. Hypertension: Restart carvedilol  once able to p.o.  FEN/GI: NPO  VTE Prophylaxis: Lovenox    Disposition: Med-Surg   History of Present Illness:  Kyle Berry is a 53 y.o. male presenting with Epigastric  abdominal pain. CT abdominal w/ contrast show acute on Chronic pancreatitis . Patient states he has been having Intermittent nausea and vomiting with epigastric pain for  several months, ever since stopping Creon  (cost barrier). Last bowel movement this morning. Subjective fever and chills at night.  In the ED patient was hemodynamically stable. His pertinent labs including Potassium 3.1 ( received Potassium 40 mrq PO in ED ), AST 51(H). BUN <5 w/ normal sCr. CT abdominal w/ contrast shows Acute on chronic pancreatitis. No abscess or pseudocyst w/ Atrophic right kidney and  A 5 mm nonobstructing left renal inferior pole calculus. No hydronephrosis.   Review Of Systems: Per HPI with the following additions:  Review of Systems  Constitutional:  Positive for chills, fever and malaise/fatigue.  Respiratory: Negative.    Cardiovascular: Negative.   Gastrointestinal:  Positive for abdominal pain, diarrhea, nausea and vomiting.  Neurological: Negative.       Pertinent Past Medical History: - Atrophy of right kidney - Chronic pancreatitis - GERD  - HTN - High cholesterol - Gallstone  Remainder reviewed in history tab.   Pertinent Past Surgical History: Cholecystectomy  Remainder reviewed in history tab.   Pertinent Social History: Tobacco use: No Alcohol use: 2 months without a dink  Other Substance use: Denies Lives with girlfriend  Pertinent Family History: Not contributing  Important Outpatient Medications: Only taking: Carvedilol  3.125 BID Gabapentin  300 mg TID Famotidine  20 mg BID  Remainder reviewed in medication history.   Objective: BP (!) 162/103   Pulse (!) 57   Temp 97.7 F (36.5 C) (Oral)   Resp 18   SpO2 100%  Physical Exam Constitutional:      Appearance: He is well-developed.  Cardiovascular:     Rate and Rhythm: Normal rate and regular rhythm.  Pulmonary:  Effort: Pulmonary effort is normal.     Breath sounds: Normal breath sounds.  Abdominal:     General: Bowel sounds are normal.     Tenderness: There is abdominal tenderness in the epigastric area and left upper quadrant.  Neurological:     General: No focal  deficit present.     Mental Status: He is alert and oriented to person, place, and time.  Psychiatric:        Mood and Affect: Mood normal.        Behavior: Behavior normal.      Labs:  CBC BMET  Recent Labs  Lab 11/18/23 0957  WBC 5.2  HGB 12.9*  HCT 37.8*  PLT 180   Recent Labs  Lab 11/18/23 0957  NA 138  K 3.1*  CL 100  CO2 25  BUN <5*  CREATININE 1.12  GLUCOSE 105*  CALCIUM 9.0      Imaging Studies Performed: EXAM: CT ABDOMEN AND PELVIS WITH CONTRAST IMPRESSION: 1. Acute on chronic pancreatitis. No abscess or pseudocyst. 2. Atrophic right kidney. 3. A 5 mm nonobstructing left renal inferior pole calculus. No hydronephrosis. 4. No bowel obstruction. Normal appendix.   Howell Lunger, DO 11/18/2023, 4:32 PM PGY-1, [place] [facility]  FPTS Intern pager: [phone], text pages welcome Secure chat group [facility] Teaching Service   Upper Level Addendum: I have seen and evaluated this patient along with Dr. Suzen and reviewed the above note, making necessary revisions as appropriate. I agree with the medical decision making and physical exam as noted above. Lunger Howell, DO PGY-2 [facility] Family Medicine Residency

## 2023-11-18 NOTE — Hospital Course (Addendum)
 Kyle Berry is a 53 y.o. male with history of chronic pancreatitis who was admitted for acute on chronic pancreatitis.  Acute pancreatitis Patient bolused with 1L NS then placed on LR 100 ml/hr for the first 48 hours of hospitalization. Nausea treated with compazine , reglan , phenergan , and zofran . Pain controlled with dilaudid . Patient failed PO trials on 8/7 and 11/24/23. Pancreatic enzymes restarted with meals. GI and IR consulted for repeat celiac plexus block. Ir performed celiac plexus block on 11/28/23. Patient had improvement of his pain and was able to tolerate oral soft foods with only PO medications on 11/29/23.   Other chronic conditions were medically managed with home medications and formulary alternatives as necessary (HTN)  PCP recommendations: CREON  enzyme supplementation 76k TID with meals and 36k with snacks, copay should be $0. GI appt 12/27/23; potential for neurolysis with IR if good pain relief from celiac plexus block Follow up for cystoscopy in 4 weeks after discharge.

## 2023-11-19 ENCOUNTER — Other Ambulatory Visit (HOSPITAL_COMMUNITY): Payer: Self-pay

## 2023-11-19 ENCOUNTER — Telehealth (HOSPITAL_COMMUNITY): Payer: Self-pay | Admitting: Pharmacy Technician

## 2023-11-19 DIAGNOSIS — K859 Acute pancreatitis without necrosis or infection, unspecified: Secondary | ICD-10-CM | POA: Diagnosis not present

## 2023-11-19 DIAGNOSIS — K861 Other chronic pancreatitis: Secondary | ICD-10-CM | POA: Diagnosis not present

## 2023-11-19 LAB — CBC
HCT: 32.7 % — ABNORMAL LOW (ref 39.0–52.0)
Hemoglobin: 11.3 g/dL — ABNORMAL LOW (ref 13.0–17.0)
MCH: 34.9 pg — ABNORMAL HIGH (ref 26.0–34.0)
MCHC: 34.6 g/dL (ref 30.0–36.0)
MCV: 100.9 fL — ABNORMAL HIGH (ref 80.0–100.0)
Platelets: 162 K/uL (ref 150–400)
RBC: 3.24 MIL/uL — ABNORMAL LOW (ref 4.22–5.81)
RDW: 16.8 % — ABNORMAL HIGH (ref 11.5–15.5)
WBC: 4.4 K/uL (ref 4.0–10.5)
nRBC: 0 % (ref 0.0–0.2)

## 2023-11-19 LAB — COMPREHENSIVE METABOLIC PANEL WITH GFR
ALT: 23 U/L (ref 0–44)
AST: 41 U/L (ref 15–41)
Albumin: 3 g/dL — ABNORMAL LOW (ref 3.5–5.0)
Alkaline Phosphatase: 57 U/L (ref 38–126)
Anion gap: 6 (ref 5–15)
BUN: <5 mg/dL — ABNORMAL LOW (ref 6–20)
CO2: 27 mmol/L (ref 22–32)
Calcium: 8.1 mg/dL — ABNORMAL LOW (ref 8.9–10.3)
Chloride: 102 mmol/L (ref 98–111)
Creatinine, Ser: 0.98 mg/dL (ref 0.61–1.24)
GFR, Estimated: >60 mL/min (ref >60)
Glucose, Bld: 102 mg/dL — ABNORMAL HIGH (ref 70–99)
Potassium: 3.2 mmol/L — ABNORMAL LOW (ref 3.5–5.1)
Sodium: 135 mmol/L (ref 135–145)
Total Bilirubin: 1.7 mg/dL — ABNORMAL HIGH (ref 0.0–1.2)
Total Protein: 5.9 g/dL — ABNORMAL LOW (ref 6.5–8.1)

## 2023-11-19 LAB — LIPID PANEL
Cholesterol: 147 mg/dL (ref 0–200)
HDL: 72 mg/dL (ref >40)
LDL Cholesterol: 55 mg/dL (ref 0–99)
Total CHOL/HDL Ratio: 2 ratio
Triglycerides: 98 mg/dL (ref <150)
VLDL: 20 mg/dL (ref 0–40)

## 2023-11-19 LAB — VITAMIN B12: Vitamin B-12: 165 pg/mL — ABNORMAL LOW (ref 180–914)

## 2023-11-19 LAB — MAGNESIUM: Magnesium: 1.1 mg/dL — ABNORMAL LOW (ref 1.7–2.4)

## 2023-11-19 LAB — FOLATE: Folate: 13.4 ng/mL (ref >5.9)

## 2023-11-19 MED ORDER — PROMETHAZINE HCL 25 MG PO TABS
12.5000 mg | ORAL_TABLET | Freq: Four times a day (QID) | ORAL | Status: DC | PRN
  Administered 2023-11-20 – 2023-11-26 (×12): 12.5 mg via ORAL
  Filled 2023-11-19 (×10): qty 1

## 2023-11-19 MED ORDER — POTASSIUM CHLORIDE CRYS ER 20 MEQ PO TBCR
40.0000 meq | EXTENDED_RELEASE_TABLET | Freq: Once | ORAL | Status: AC
  Administered 2023-11-19: 40 meq via ORAL
  Filled 2023-11-19: qty 2

## 2023-11-19 MED ORDER — MAGNESIUM SULFATE 4 GM/100ML IV SOLN
4.0000 g | Freq: Once | INTRAVENOUS | Status: AC
  Administered 2023-11-19: 4 g via INTRAVENOUS
  Filled 2023-11-19: qty 100

## 2023-11-19 MED ORDER — PROMETHAZINE HCL 25 MG PO TABS: 12.5000 mg | ORAL_TABLET | Freq: Once | ORAL | Status: DC

## 2023-11-19 MED ORDER — PANCRELIPASE (LIP-PROT-AMYL) 12000-38000 UNITS PO CPEP
24000.0000 [IU] | ORAL_CAPSULE | Freq: Three times a day (TID) | ORAL | Status: DC
  Administered 2023-11-19 – 2023-11-22 (×9): 24000 [IU] via ORAL
  Filled 2023-11-19 (×10): qty 2

## 2023-11-19 MED ORDER — LACTATED RINGERS IV SOLN: INTRAVENOUS | Status: DC

## 2023-11-19 MED ORDER — MAGNESIUM SULFATE 2 GM/50ML IV SOLN
2.0000 g | Freq: Once | INTRAVENOUS | Status: AC
  Administered 2023-11-19: 2 g via INTRAVENOUS
  Filled 2023-11-19: qty 50

## 2023-11-19 MED ORDER — ACETAMINOPHEN 500 MG PO TABS
1000.0000 mg | ORAL_TABLET | Freq: Four times a day (QID) | ORAL | Status: DC
  Administered 2023-11-19 – 2023-11-30 (×43): 1000 mg via ORAL
  Filled 2023-11-19 (×41): qty 2

## 2023-11-19 MED ORDER — VITAMIN B-12 1000 MCG PO TABS
500.0000 ug | ORAL_TABLET | Freq: Every day | ORAL | Status: DC
  Administered 2023-11-19 – 2023-11-30 (×15): 500 ug via ORAL
  Filled 2023-11-19 (×12): qty 1

## 2023-11-19 MED ORDER — SODIUM CHLORIDE 0.9 % IV SOLN
12.5000 mg | Freq: Once | INTRAVENOUS | Status: DC
  Administered 2023-11-19: 12.5 mg via INTRAVENOUS
  Filled 2023-11-19: qty 12.5

## 2023-11-19 MED ORDER — CARVEDILOL 6.25 MG PO TABS
3.1250 mg | ORAL_TABLET | Freq: Two times a day (BID) | ORAL | Status: DC
  Administered 2023-11-19 – 2023-11-30 (×28): 3.125 mg via ORAL
  Filled 2023-11-19: qty 0.5
  Filled 2023-11-19 (×8): qty 1
  Filled 2023-11-19: qty 0.5
  Filled 2023-11-19 (×13): qty 1

## 2023-11-19 NOTE — Telephone Encounter (Signed)
 Patient Product/process development scientist completed.    The patient is insured through [employer]. Patient has [employer], may use a copay card, and/or apply for patient assistance if available.    Ran test claim for Creon  24000-76000 units Cpep and the current 30 day co-pay is $0.00.   This test claim was processed through [facility]- copay amounts may vary at other pharmacies due to pharmacy/plan contracts, or as the patient moves through the different stages of their insurance plan.     Reyes Sharps, CPHT Pharmacy Technician III Certified Patient Advocate [facility] Patient Advocate Team Direct Number: [phone]  Fax: [phone]

## 2023-11-19 NOTE — Assessment & Plan Note (Addendum)
 Per patient, nausea and pain are not improved.      - diet advance as tolerated - Continue LR 100 ml/hr at this time given low PO - Phenergan  12.5 mg for nausea, will see how this helps versus compazine  - discontinued Zofran  as this was unhelpful - AM CBC, BMP; electrolytes repleted this morning

## 2023-11-19 NOTE — Plan of Care (Signed)

## 2023-11-19 NOTE — Assessment & Plan Note (Addendum)
 Last drink reportedly 2 months ago, likely low risk. CIWA scores have been 0 - Plan to keep CIWA through 48 hours of hospitalization

## 2023-11-19 NOTE — Assessment & Plan Note (Signed)
 Restart pancreatic enzymes once able to p.o. - Will discuss with our pharmacist about cost of pancreatic enzymes

## 2023-11-19 NOTE — TOC CM/SW Note (Signed)
 Transition of Care [facility]) - Inpatient Brief Assessment   Patient Details  Name: Kyle Berry MRN: [id number] Date of Birth: 05-Sep-1970  Transition of [facility]) CM/SW Contact:    Lauraine FORBES Saa, LCSW Phone Number: 11/19/2023, 9:07 AM   Clinical Narrative:  9:07 AM Per chart review, patient resides at home alone. Patient has a PCP and insurance. Patient does not have SNF/HH/DME history. Patient's preferred pharmacy's are [facility] and [facility] [address]. No TOC needs were identified at this time. TOC will continue to follow and be available to assist.  Transition of Care Asessment: Insurance and Status: Insurance coverage has been reviewed Patient has primary care physician: Yes Home environment has been reviewed: Private Residence Prior level of function:: N/A Prior/Current Home Services: No current home services Social Drivers of Health Review: SDOH reviewed no interventions necessary Readmission risk has been reviewed: Yes Transition of care needs: no transition of care needs at this time

## 2023-11-19 NOTE — Progress Notes (Signed)
     Daily Progress Note Intern Pager: [phone]  Patient name: Kyle Berry Medical record number: [id number] Date of birth: 1970-09-27 Age: 53 y.o. Gender: male  Primary Care Provider: [facility] Consultants: none Code Status: Full  Pt Overview and Major Events to Date:  8/4 - admitted   Assessment and Plan:  53 year old male admitted for acute on chronic pancreatitis complicated by no oral intake. Pertinent PMH/PSH includes HTN, HLD, GERD.  Assessment & Plan Acute on chronic pancreatitis (HCC) Per patient, nausea and pain are not improved.      - diet advance as tolerated - Continue LR 100 ml/hr at this time given low PO - Phenergan  12.5 mg for nausea, will see how this helps versus compazine  - discontinued Zofran  as this was unhelpful - AM CBC, BMP; electrolytes repleted this morning History of alcohol abuse Last drink reportedly 2 months ago, likely low risk. CIWA scores have been 0 - Plan to keep CIWA through 48 hours of hospitalization Pancreatic insufficiency Restart pancreatic enzymes once able to p.o. - Will discuss with our pharmacist about cost of pancreatic enzymes Chronic health problem GERD: Do not think he was taking famotidine  OP Hypertension: Will restart carvedilol  today  FEN/GI: CLD, adv as tolerated PPx: lovenox  Dispo:Home pending clinical improvement .   Subjective:  His nausea and epigastric pain are not improved since yesterday. Zofran  has not helped his nausea, but the compazine  did. He generally finds phenergan  is most beneficial for his nausea during pancreatitis flares.   Objective: Temp:  [97.6 F (36.4 C)-98.6 F (37 C)] 98.6 F (37 C) (08/05 0727) Pulse Rate:  [57-88] 59 (08/05 0727) Resp:  [16-18] 18 (08/05 0727) BP: (137-175)/(88-106) 156/98 (08/05 0727) SpO2:  [95 %-100 %] 97 % (08/05 0727)  Physical Exam: General: well appearing man sitting up in bed Cardiovascular: RRR, no m/r/g Respiratory:  CTAB, no increased WOB Abdomen: soft, nondistended, tenderness in the RUQ and epigastrium Extremities: no edema  Laboratory: Most recent CBC Lab Results  Component Value Date   WBC 4.4 11/19/2023   HGB 11.3 (L) 11/19/2023   HCT 32.7 (L) 11/19/2023   MCV 100.9 (H) 11/19/2023   PLT 162 11/19/2023   Most recent BMP    Latest Ref Rng & Units 11/18/2023    9:57 AM  BMP  Glucose 70 - 99 mg/dL 894   BUN 6 - 20 mg/dL <5   Creatinine 9.38 - 1.24 mg/dL 8.87   Sodium 864 - 854 mmol/L 138   Potassium 3.5 - 5.1 mmol/L 3.1   Chloride 98 - 111 mmol/L 100   CO2 22 - 32 mmol/L 25   Calcium 8.9 - 10.3 mg/dL 9.0    Imaging/Diagnostic Tests: None  Alena Morrison, Elio, MD 11/19/2023, 7:33 AM  PGY-1, [place] [facility] FPTS Intern pager: [phone], text pages welcome Secure chat group [facility] Teaching Service

## 2023-11-19 NOTE — Assessment & Plan Note (Addendum)
 GERD: Do not think he was taking famotidine  OP Hypertension: Will restart carvedilol  today

## 2023-11-19 NOTE — Plan of Care (Signed)

## 2023-11-20 DIAGNOSIS — K861 Other chronic pancreatitis: Secondary | ICD-10-CM | POA: Diagnosis not present

## 2023-11-20 DIAGNOSIS — K859 Acute pancreatitis without necrosis or infection, unspecified: Secondary | ICD-10-CM | POA: Diagnosis not present

## 2023-11-20 LAB — COMPREHENSIVE METABOLIC PANEL WITH GFR
ALT: 24 U/L (ref 0–44)
AST: 46 U/L — ABNORMAL HIGH (ref 15–41)
Albumin: 3.3 g/dL — ABNORMAL LOW (ref 3.5–5.0)
Alkaline Phosphatase: 68 U/L (ref 38–126)
Anion gap: 9 (ref 5–15)
BUN: 9 mg/dL (ref 6–20)
CO2: 26 mmol/L (ref 22–32)
Calcium: 8.6 mg/dL — ABNORMAL LOW (ref 8.9–10.3)
Chloride: 101 mmol/L (ref 98–111)
Creatinine, Ser: 1.2 mg/dL (ref 0.61–1.24)
GFR, Estimated: >60 mL/min (ref >60)
Glucose, Bld: 99 mg/dL (ref 70–99)
Potassium: 4.1 mmol/L (ref 3.5–5.1)
Sodium: 136 mmol/L (ref 135–145)
Total Bilirubin: 1.7 mg/dL — ABNORMAL HIGH (ref 0.0–1.2)
Total Protein: 6.7 g/dL (ref 6.5–8.1)

## 2023-11-20 LAB — CBC
HCT: 37.5 % — ABNORMAL LOW (ref 39.0–52.0)
Hemoglobin: 12.5 g/dL — ABNORMAL LOW (ref 13.0–17.0)
MCH: 34.3 pg — ABNORMAL HIGH (ref 26.0–34.0)
MCHC: 33.3 g/dL (ref 30.0–36.0)
MCV: 103 fL — ABNORMAL HIGH (ref 80.0–100.0)
Platelets: 151 K/uL (ref 150–400)
RBC: 3.64 MIL/uL — ABNORMAL LOW (ref 4.22–5.81)
RDW: 17.1 % — ABNORMAL HIGH (ref 11.5–15.5)
WBC: 5.3 K/uL (ref 4.0–10.5)
nRBC: 0 % (ref 0.0–0.2)

## 2023-11-20 LAB — MAGNESIUM: Magnesium: 2.3 mg/dL (ref 1.7–2.4)

## 2023-11-20 MED ORDER — HYDROMORPHONE HCL 2 MG PO TABS
1.0000 mg | ORAL_TABLET | ORAL | Status: DC | PRN
  Administered 2023-11-20 – 2023-11-21 (×2): 1 mg via ORAL
  Filled 2023-11-20 (×2): qty 1

## 2023-11-20 MED ORDER — HYDROMORPHONE HCL 1 MG/ML IJ SOLN
0.5000 mg | INTRAMUSCULAR | Status: DC | PRN
  Administered 2023-11-20 – 2023-11-21 (×4): 0.5 mg via INTRAVENOUS
  Filled 2023-11-20 (×4): qty 0.5

## 2023-11-20 NOTE — Progress Notes (Signed)
 Mobility Specialist Progress Note:   11/20/23 0915  Mobility  Activity Ambulated independently  Level of Assistance Independent  Assistive Device None  Distance Ambulated (ft) 150 ft  Activity Response Tolerated well  Mobility Referral Yes  Mobility visit 1 Mobility  Mobility Specialist Start Time (ACUTE ONLY) 0915  Mobility Specialist Stop Time (ACUTE ONLY) 0930  Mobility Specialist Time Calculation (min) (ACUTE ONLY) 15 min   Pt agreeable to mobility session. Required no physical assistance throughout ambulation. Pt back sitting EOB with all needs met.  Therisa Rana Mobility Specialist Please contact via SecureChat or  Rehab office at [phone]

## 2023-11-20 NOTE — Assessment & Plan Note (Addendum)
 Per patient, symptoms 70% improved, and he is hoping to try a regular diet today. His T bili is elevated but stable today.      - ordered regular diet - D/C LR 100 ml/hr if breakfast goes well - Phenergan  12.5 mg q6h PRN for nausea, consider dc if breakfast and lunch go well - AM BMP

## 2023-11-20 NOTE — Plan of Care (Signed)

## 2023-11-20 NOTE — Assessment & Plan Note (Signed)
-   CREON  24 k TID with meals, his OP copay should be $0 for this

## 2023-11-20 NOTE — Assessment & Plan Note (Signed)
 Last drink reportedly 2 months ago, likely low risk. CIWA scores not concerning for withdrawal. CIWA d/ced.

## 2023-11-20 NOTE — Assessment & Plan Note (Signed)
 GERD: Do not think he was taking famotidine  OP Hypertension: Continue carvedilol  today

## 2023-11-20 NOTE — Progress Notes (Addendum)
     Daily Progress Note Intern Pager: [phone]  Patient name: Kyle Berry Medical record number: [id number] Date of birth: 09-12-1970 Age: 53 y.o. Gender: male  Primary Care Provider: [facility] Consultants: none Code Status: full   Pt Overview and Major Events to Date:  8/4 - admitted  Assessment and Plan:  53 year old male admitted for acute on chronic pancreatitis, now gradually improving. Pertinent PMH/PSH includes HTN, HLD, GERD.  Assessment & Plan Acute on chronic pancreatitis (HCC) Per patient, symptoms 70% improved, and he is hoping to try a regular diet today. His T bili is elevated but stable today.      - ordered regular diet - D/C LR 100 ml/hr if breakfast goes well - Phenergan  12.5 mg q6h PRN for nausea, consider dc if breakfast and lunch go well - AM BMP History of alcohol abuse Last drink reportedly 2 months ago, likely low risk. CIWA scores not concerning for withdrawal. CIWA d/ced. Pancreatic insufficiency - CREON  24 k TID with meals, his OP copay should be $0 for this Chronic health problem GERD: Do not think he was taking famotidine  OP Hypertension: Continue carvedilol  today  FEN/GI: full liquid, adv as tol PPx: [place] lovenox  Dispo:Home pending clinical improvement .   Subjective:  Patient reports 70% improvement of symptoms. Still some nausea and epigastric pain radiating to his back, worse at night. Wants to eat breakfast this morning.   Objective: Temp:  [97.5 F (36.4 C)-98 F (36.7 C)] 98 F (36.7 C) (08/06 0727) Pulse Rate:  [56-74] 56 (08/06 0727) Resp:  [18-20] 18 (08/06 0444) BP: (138-150)/(90-99) 144/92 (08/06 0727) SpO2:  [97 %-99 %] 98 % (08/06 0727)  Physical Exam: General: patient lying in bed Cardiovascular: RRR, no m/r/g Respiratory: CTAB, no increased WOB Abdomen: soft, tenderness in the epigastrium with voluntary guarding  Laboratory: Most recent CBC Lab Results  Component Value Date    WBC 5.3 11/20/2023   HGB 12.5 (L) 11/20/2023   HCT 37.5 (L) 11/20/2023   MCV 103.0 (H) 11/20/2023   PLT 151 11/20/2023   Most recent BMP    Latest Ref Rng & Units 11/20/2023    5:43 AM  BMP  Glucose 70 - 99 mg/dL 99   BUN 6 - 20 mg/dL 9   Creatinine 9.38 - 8.75 mg/dL 8.79   Sodium 864 - 854 mmol/L 136   Potassium 3.5 - 5.1 mmol/L 4.1   Chloride 98 - 111 mmol/L 101   CO2 22 - 32 mmol/L 26   Calcium 8.9 - 10.3 mg/dL 8.6     Imaging/Diagnostic Tests: None  Alena Morrison, Elio, MD 11/20/2023, 7:44 AM  PGY-1, [place] [facility] FPTS Intern pager: [phone], text pages welcome Secure chat group [facility] Teaching Service

## 2023-11-21 ENCOUNTER — Inpatient Hospital Stay (HOSPITAL_COMMUNITY)

## 2023-11-21 DIAGNOSIS — K859 Acute pancreatitis without necrosis or infection, unspecified: Secondary | ICD-10-CM | POA: Diagnosis not present

## 2023-11-21 DIAGNOSIS — K861 Other chronic pancreatitis: Secondary | ICD-10-CM | POA: Diagnosis not present

## 2023-11-21 LAB — COMPREHENSIVE METABOLIC PANEL WITH GFR
ALT: 21 U/L (ref 0–44)
AST: 30 U/L (ref 15–41)
Albumin: 3 g/dL — ABNORMAL LOW (ref 3.5–5.0)
Alkaline Phosphatase: 75 U/L (ref 38–126)
Anion gap: 6 (ref 5–15)
BUN: 6 mg/dL (ref 6–20)
CO2: 25 mmol/L (ref 22–32)
Calcium: 8.7 mg/dL — ABNORMAL LOW (ref 8.9–10.3)
Chloride: 102 mmol/L (ref 98–111)
Creatinine, Ser: 1.03 mg/dL (ref 0.61–1.24)
GFR, Estimated: >60 mL/min (ref >60)
Glucose, Bld: 137 mg/dL — ABNORMAL HIGH (ref 70–99)
Potassium: 3.7 mmol/L (ref 3.5–5.1)
Sodium: 133 mmol/L — ABNORMAL LOW (ref 135–145)
Total Bilirubin: 0.5 mg/dL (ref 0.0–1.2)
Total Protein: 5.9 g/dL — ABNORMAL LOW (ref 6.5–8.1)

## 2023-11-21 LAB — MAGNESIUM: Magnesium: 1.8 mg/dL (ref 1.7–2.4)

## 2023-11-21 MED ORDER — HYDROMORPHONE HCL 1 MG/ML IJ SOLN
0.5000 mg | INTRAMUSCULAR | Status: DC | PRN
  Administered 2023-11-21 – 2023-11-23 (×10): 0.5 mg via INTRAVENOUS
  Filled 2023-11-21 (×10): qty 0.5

## 2023-11-21 MED ORDER — HYDROMORPHONE HCL 2 MG PO TABS
1.0000 mg | ORAL_TABLET | ORAL | Status: DC | PRN
  Administered 2023-11-21 – 2023-11-29 (×13): 1 mg via ORAL
  Filled 2023-11-21 (×10): qty 1

## 2023-11-21 MED ORDER — HYDROMORPHONE HCL 2 MG PO TABS
1.0000 mg | ORAL_TABLET | ORAL | Status: DC | PRN
  Administered 2023-11-21: 1 mg via ORAL
  Filled 2023-11-21: qty 1

## 2023-11-21 NOTE — Plan of Care (Signed)

## 2023-11-21 NOTE — Assessment & Plan Note (Signed)
-   CREON  24 k TID with meals, his OP copay should be $0 for this

## 2023-11-21 NOTE — Progress Notes (Addendum)
     Daily Progress Note Intern Pager: [phone]  Patient name: Kyle Berry Medical record number: [id number] Date of birth: 1970/04/23 Age: 53 y.o. Gender: male  Primary Care Provider: [facility] Consultants: none Code Status: full  Pt Overview and Major Events to Date:  8/4 - admitted  Assessment and Plan:  53 yo male admitted for acute on chronic pancreatitis, improved from admission but not at baseline. Pertinent PMH/PSH includes HTN, HLD, GERD.  Assessment & Plan Acute on chronic pancreatitis (HCC) Per patient, symptoms remain 70% improved. He ate full meals yesterday without vomiting.      - regular diet - will see how his PO goes today - continue po dilaudid  for pain if needed today.will d/c IV dilaudid  since he has good PO. History of alcohol abuse Last drink reportedly 2 months ago, likely low risk.  Pancreatic insufficiency - CREON  24 k TID with meals, his OP copay should be $0 for this Chronic health problem GERD: Do not think he was taking famotidine  OP Hypertension: Continue carvedilol  today  FEN/GI: regular PPx: [place] lovenox  Dispo:Home pending clinical improvement .    Subjective:  Patients reports he is still 70% improved. Able to eat all of his meals yesterday without vomiting but did have nausea. Does have intermittent pain. Reports po pain medication were not helpful for his pain, only the IV medication is helpful.  Objective: Temp:  [98.2 F (36.8 C)-98.8 F (37.1 C)] 98.2 F (36.8 C) (08/07 0730) Pulse Rate:  [63-77] 63 (08/07 0730) Resp:  [14-18] 14 (08/07 0459) BP: (131-161)/(86-93) 139/92 (08/07 0730) SpO2:  [97 %-98 %] 98 % (08/07 0730)  Physical Exam: General: well appearing man standing at bedside when I entered the room Cardiovascular: RRR no m/r/g Respiratory: CTAB, no increased WOB Abdomen: soft, nondistended, voluntary guarding in the epigastrium with tenderness  Laboratory: Most recent CBC Lab  Results  Component Value Date   WBC 5.3 11/20/2023   HGB 12.5 (L) 11/20/2023   HCT 37.5 (L) 11/20/2023   MCV 103.0 (H) 11/20/2023   PLT 151 11/20/2023   Most recent BMP    Latest Ref Rng & Units 11/20/2023    5:43 AM  BMP  Glucose 70 - 99 mg/dL 99   BUN 6 - 20 mg/dL 9   Creatinine 9.38 - 8.75 mg/dL 8.79   Sodium 864 - 854 mmol/L 136   Potassium 3.5 - 5.1 mmol/L 4.1   Chloride 98 - 111 mmol/L 101   CO2 22 - 32 mmol/L 26   Calcium 8.9 - 10.3 mg/dL 8.6    Imaging/Diagnostic Tests: None  Alena Morrison, Elio, MD 11/21/2023, 7:34 AM  PGY-1, [place] [facility] FPTS Intern pager: [phone], text pages welcome Secure chat group [facility] Teaching Service

## 2023-11-21 NOTE — Assessment & Plan Note (Signed)
 GERD: Do not think he was taking famotidine  OP Hypertension: Continue carvedilol  today

## 2023-11-21 NOTE — Discharge Summary (Addendum)
 Family Medicine Teaching [facility] Discharge Summary  Patient name: Kyle Berry Medical record number: [id number] Date of birth: Dec 26, 1970 Age: 53 y.o. Gender: male Date of Admission: 11/18/2023  Date of Discharge: 11/30/23 Admitting Physician: [place] KATHEE Samuels, DO  Primary Care Provider: [facility] Consultants: none  Indication for Hospitalization: acute on chronic pancreatitis  Discharge Diagnoses/Problem List:  Principal Problem:   Acute on chronic pancreatitis [facility]) Active Problems:   Pancreatic insufficiency   History of alcohol abuse   Pancreatitis   Chronic health problem   Malnutrition of moderate degree   The above problem list has been updated and reviewed for accuracy, including the initial reason for admission.   Brief Hospital Course:  Kyle Berry is a 53 y.o. male with history of chronic pancreatitis who was admitted for acute on chronic pancreatitis.  Acute pancreatitis Patient bolused with 1L NS then placed on LR 100 ml/hr for the first 48 hours of hospitalization. Nausea treated with compazine , reglan , phenergan , and zofran . Pain controlled with dilaudid . Patient failed PO trials on 8/7 and 11/24/23. Pancreatic enzymes restarted with meals. GI and IR consulted for repeat celiac plexus block. Ir performed celiac plexus block on 11/28/23. Patient had improvement of his pain and was able to tolerate oral soft foods with only PO medications on 11/29/23.   Other chronic conditions were medically managed with home medications and formulary alternatives as necessary (HTN)  PCP recommendations: CREON  enzyme supplementation 76k TID with meals and 36k with snacks, copay should be $0. GI appt 12/27/23; potential for neurolysis with IR if good pain relief from celiac plexus block Follow up for cystoscopy in 4 weeks after discharge.    Results/Tests Pending at Time of Discharge:  Unresulted Labs (From admission, onward)     None      Disposition: home  Discharge Condition: stable  Discharge Exam:  Vitals:   11/29/23 2059 11/30/23 0907  BP: (!) 152/96 (!) 147/96  Pulse: 68 77  Resp:  19  Temp: 98.4 F (36.9 C) 98.7 F (37.1 C)  SpO2: 100% 100%   General: well appearing man lying in bed Cardiovascular: RRR no m/r/g Respiratory: CTAB, no increased WOB Abdomen: soft, nondistended, no guarding, no grimace or other sign of pain with epigastric palpation  Exam by Dr. Damien Pinal  Significant Procedures: Celiac plexus block with IR on 11/28/23  Significant Labs and Imaging:  No results for input(s): WBC, HGB, HCT, PLT in the last 48 hours.  Recent Labs  Lab 11/29/23 0459  NA 135  K 4.4  CL 103  CO2 21*  GLUCOSE 164*  BUN 12  CREATININE 1.44*  CALCIUM 9.4    CT abd/pelvis 11/16/23 IMPRESSION: 1. Acute on chronic pancreatitis. No abscess or pseudocyst. 2. Atrophic right kidney. 3. A 5 mm nonobstructing left renal inferior pole calculus. No hydronephrosis. 4. No bowel obstruction. Normal appendix.   Discharge Medications:  Allergies as of 11/30/2023       Reactions   Norvasc  [amlodipine  Besylate] Other (See Comments)   Fluid buildup in chest   Voltaren  [diclofenac ] Hives, Other (See Comments)   Fluid buildup in chest   Prilosec [omeprazole] Other (See Comments)   MD stopped due to pancreatitis   Hydrochlorothiazide Itching   Ultram  [tramadol ] Other (See Comments)   Stomach upset   Prednisone  Hives, Other (See Comments)   Mood swings        Medication List     STOP taking these medications  cephALEXin  250 MG capsule Commonly known as: KEFLEX    ondansetron  4 MG disintegrating tablet Commonly known as: ZOFRAN -ODT       TAKE these medications    albuterol  108 (90 Base) MCG/ACT inhaler Commonly known as: VENTOLIN  HFA Inhale 2 puffs into the lungs every 6 (six) hours as needed for wheezing or shortness of breath.   carvedilol  3.125 MG tablet Commonly  known as: COREG  Take 1 tablet (3.125 mg total) by mouth 2 (two) times daily with a meal.   Ensure Original Liqd Take 1 Container by mouth 2 (two) times daily.   famotidine  20 MG tablet Commonly known as: PEPCID  Take 1 tablet (20 mg total) by mouth 2 (two) times daily for 30 days, THEN 1 tablet (20 mg total) daily. Start taking on: October 23, 2022 What changed: See the new instructions.   gabapentin  300 MG capsule Commonly known as: NEURONTIN  Take 300 mg by mouth 3 (three) times daily as needed (Pain).   HYDROmorphone  2 MG tablet Commonly known as: DILAUDID  Take 0.5 tablets (1 mg total) by mouth every 4 (four) hours as needed for up to 3 days for moderate pain (pain score 4-6) or severe pain (pain score 7-10).   lipase/protease/amylase 63999 UNITS Cpep capsule Commonly known as: CREON  Take 2 capsules with each meal and 1 capsule with each snack (2 snacks/day)   naloxone  4 MG/0.1ML Liqd nasal spray kit Commonly known as: NARCAN  Place 1 spray into the nose daily as needed (opioid reversal).   promethazine  25 MG tablet Commonly known as: PHENERGAN  Take 1 tablet (25 mg total) by mouth every 6 (six) hours as needed for up to 5 days for nausea or vomiting.   VISINE OP Place 1 drop into both eyes daily.        Discharge Instructions: Please refer to Patient Instructions section of EMR for full details.  Patient was counseled important signs and symptoms that should prompt return to medical care, changes in medications, dietary instructions, activity restrictions, and follow up appointments.   Follow-Up Appointments:  Future Appointments  Date Time Provider Department Center  12/27/2023  8:30 AM Zehr, Harlene BIRCH, PA-C [facility]     Follow-up Information     [facility] Schedule an appointment as soon as possible for a visit in 1 week(s).   Contact information: [address] [place] [address] [phone]                  Howell Lunger, DO 11/30/2023, 1:07 PM PGY-3, [place] [facility]

## 2023-11-21 NOTE — Assessment & Plan Note (Signed)
 Last drink reportedly 2 months ago, likely low risk.

## 2023-11-21 NOTE — Discharge Instructions (Addendum)
 Dear Kyle Berry Niece,   Thank you for letting us  participate in your care! In this section, you will find a brief hospital admission summary of why you were admitted to the hospital, what happened during your admission, your diagnosis/diagnoses, and recommended follow up.   You were admitted for pancreatitis. We treated you with nausea and pain medications and IV fluids. Return to your regular diet as your pain and nausea allows. If your celiac plexus block (done 11/28/23) provides relief, interventional radiology may be able to do a neurolysis for continued pain relief. Follow up with GI as scheduled for further care and management of potential neurolysis with interventional radiology in the future.   POST-HOSPITAL & CARE INSTRUCTIONS We recommend following up with your PCP within 1 week from being discharged from the hospital. GI appointment scheduled for 12/27/23 Please let PCP/Specialists know of any changes in medications that were made which you will be able to see in the medications section of this packet.  DOCTOR'S APPOINTMENTS & FOLLOW UP Future Appointments  Date Time Provider Department Center  12/27/2023  8:30 AM Zehr, Harlene BIRCH, PA-C [facility]     Thank you for choosing [facility]! Take care and be well!  Family Medicine Teaching Service Inpatient Team [place]  [facility]  [address] [place], [place] [address] [phone]

## 2023-11-21 NOTE — Assessment & Plan Note (Addendum)
 Per patient, symptoms remain 70% improved. He ate full meals yesterday without vomiting.      - regular diet - will see how his PO goes today - continue po dilaudid  for pain if needed today.will d/c IV dilaudid  since he has good PO.

## 2023-11-21 NOTE — Plan of Care (Signed)
 Evaluated patient at bed side after receiving message from RN that patient was having severe pain and vomiting. Patient states that he ate breakfast and a salad for lunch yesterday and still had some pain.  He did not eat dinner.  He then tried to eat his whole tray of breakfast this morning and has had pain ever since then with vomiting.  His abdomen is still soft but diffusely tender to palpation.  Nondistended.  Given his uncontrolled pain I do worry that he has tried to quickly to progress his diet.  He states that he is just hungry but it still is painful to eat. Will change him to a clear liquid diet at this time and add back his IV Dilaudid  as needed because p.o. Dilaudid  does not seem to be working for him.  Can trial advancing his diet and stopping IV pain medication again tomorrow

## 2023-11-22 ENCOUNTER — Inpatient Hospital Stay (HOSPITAL_COMMUNITY)

## 2023-11-22 ENCOUNTER — Ambulatory Visit: Admitting: Podiatry

## 2023-11-22 DIAGNOSIS — K861 Other chronic pancreatitis: Secondary | ICD-10-CM | POA: Diagnosis not present

## 2023-11-22 DIAGNOSIS — K859 Acute pancreatitis without necrosis or infection, unspecified: Secondary | ICD-10-CM | POA: Diagnosis not present

## 2023-11-22 LAB — COMPREHENSIVE METABOLIC PANEL WITH GFR
ALT: 24 U/L (ref 0–44)
AST: 38 U/L (ref 15–41)
Albumin: 3.2 g/dL — ABNORMAL LOW (ref 3.5–5.0)
Alkaline Phosphatase: 63 U/L (ref 38–126)
Anion gap: 9 (ref 5–15)
BUN: <5 mg/dL — ABNORMAL LOW (ref 6–20)
CO2: 23 mmol/L (ref 22–32)
Calcium: 8.8 mg/dL — ABNORMAL LOW (ref 8.9–10.3)
Chloride: 102 mmol/L (ref 98–111)
Creatinine, Ser: 0.96 mg/dL (ref 0.61–1.24)
GFR, Estimated: >60 mL/min (ref >60)
Glucose, Bld: 104 mg/dL — ABNORMAL HIGH (ref 70–99)
Potassium: 4.3 mmol/L (ref 3.5–5.1)
Sodium: 134 mmol/L — ABNORMAL LOW (ref 135–145)
Total Bilirubin: 0.3 mg/dL (ref 0.0–1.2)
Total Protein: 6.3 g/dL — ABNORMAL LOW (ref 6.5–8.1)

## 2023-11-22 MED ORDER — IOHEXOL 350 MG/ML SOLN
75.0000 mL | Freq: Once | INTRAVENOUS | Status: AC | PRN
  Administered 2023-11-22: 75 mL via INTRAVENOUS

## 2023-11-22 MED ORDER — PANCRELIPASE (LIP-PROT-AMYL) 36000-114000 UNITS PO CPEP
36000.0000 [IU] | ORAL_CAPSULE | Freq: Three times a day (TID) | ORAL | Status: DC
  Administered 2023-11-22 – 2023-11-30 (×24): 36000 [IU] via ORAL
  Filled 2023-11-22 (×26): qty 1

## 2023-11-22 NOTE — Assessment & Plan Note (Addendum)
 Hospital course complicated by worsening of pain and vomiting with PO trial, however, repeat CT AP did not show any changes in the pancreas compared to admission. - PO trial with regular diet today - phenergan  12.5 mg q6h PO - pain: tylenol  1000 mg q6h, 0.5mg  PO dilaudid , wean off IV dilaudid  for pain

## 2023-11-22 NOTE — Assessment & Plan Note (Addendum)
-   Increase CREON  to 36 k TID with meals, his OP copay should be $0 for this

## 2023-11-22 NOTE — Assessment & Plan Note (Signed)
 Last drink reportedly 2 months ago, likely low risk.

## 2023-11-22 NOTE — Assessment & Plan Note (Signed)
-   Last drink reportedly 2 months PTA

## 2023-11-22 NOTE — Progress Notes (Addendum)
     Daily Progress Note Intern Pager: [phone]  Patient name: FLYNN GWYN Medical record number: [id number] Date of birth: [birth year] Age: 53 y.o. Gender: male  Primary Care Provider: [facility] Consultants: none Code Status: full  Pt Overview and Major Events to Date:  8/4 - admitted   Assessment and Plan:  53 yo male with acute on chronic pancreatitis, improved but now worsened regarding pain and vomitting. Pertinent PMH/PSH includes HTN, HLD, GERD.  Assessment & Plan Acute on chronic pancreatitis [facility]) He had worsening of pain and vomitting with eating yesterday; however, CT did not show any changes in the pancreas compared to admission. - FLD today - phenergan  12.5 mg q6h PO - pain: tylenol  1000 mg q6h, dilaudid  po for moderate pain and IV for severe pain History of alcohol abuse Last drink reportedly 2 months ago, likely low risk.  Pancreatic insufficiency - Increase CREON  to 36 k TID with meals, his OP copay should be $0 for this Chronic health problem GERD: Do not think he was taking famotidine  OP Hypertension: Continue carvedilol  today  FEN/GI: CLD PPx: lovenox  Dispo:Home pending clinical improvement .   Subjective:  Reports he had significant pain and vomiting with eating yesterday afternoon. Still persistent pain today.   Objective: Temp:  [98.1 F (36.7 C)-98.2 F (36.8 C)] 98.1 F (36.7 C) (08/08 0437) Pulse Rate:  [63-97] 97 (08/08 0437) Resp:  [14] 14 (08/07 1935) BP: (139-162)/(91-99) 145/95 (08/08 0617) SpO2:  [98 %-100 %] 100 % (08/08 0437) Physical Exam: General: Well appearing man sitting up in bed Cardiovascular: RRR Respiratory: CTAB Abdomen: soft, nondistended, TP in epigastrium with voluntary guarding  Laboratory: Most recent CBC Lab Results  Component Value Date   WBC 5.3 11/20/2023   HGB 12.5 (L) 11/20/2023   HCT 37.5 (L) 11/20/2023   MCV 103.0 (H) 11/20/2023   PLT 151 11/20/2023   Most recent  BMP    Latest Ref Rng & Units 11/22/2023    5:03 AM  BMP  Glucose 70 - 99 mg/dL 895   BUN 6 - 20 mg/dL <5   Creatinine 9.38 - 1.24 mg/dL 9.03   Sodium 864 - 854 mmol/L 134   Potassium 3.5 - 5.1 mmol/L 4.3   Chloride 98 - 111 mmol/L 102   CO2 22 - 32 mmol/L 23   Calcium 8.9 - 10.3 mg/dL 8.8    Imaging/Diagnostic Tests: CT abd 11/21/23 Rads impression: 1. Minimal peripancreatic stranding without worsening compared to the prior exam. No organized fluid collections. 2. A few fluid-filled loops of borderline distended nonspecific small bowel in the anterior abdomen. No obstruction. 3. Severe atrophy of the right kidney. Small nonobstructing left kidney stone.   Alena Morrison, Elio, MD 11/22/2023, 7:22 AM  PGY-1, [facility] FPTS Intern pager: [phone], text pages welcome Secure chat group [facility] Teaching Service

## 2023-11-22 NOTE — Assessment & Plan Note (Signed)
 GERD: ?taking famotidine  at home Hypertension: continue carvedilol 

## 2023-11-22 NOTE — Assessment & Plan Note (Signed)
-   continue CREON  to 36,000u TID with meals, his OP copay should be $0 for this

## 2023-11-22 NOTE — Progress Notes (Addendum)
     Daily Progress Note Intern Pager: [phone]  Patient name: OSAZE HUBBERT Medical record number: [id number] Date of birth: February 18, 1971 Age: 53 y.o. Gender: male  Primary Care Provider: [facility] Consultants: none Code Status: FULL    Pt Overview and Major Events to Date:  08/04: admitted  Assessment and Plan:  Garo Heidelberg is a 53 yo M who was admitted for acute on chronic pancreatitis. Hospital course has been complicated by uncontrolled pain. Pertinent PMH/PSH includes HTN, HLD, GERD, chronic pancreatitis.  Assessment & Plan Acute on chronic pancreatitis [facility]) Hospital course complicated by worsening of pain and vomiting with PO trial, however, repeat CT AP did not show any changes in the pancreas compared to admission. - PO trial with regular diet today - phenergan  12.5 mg q6h PO - pain: tylenol  1000 mg q6h, 0.5mg  PO dilaudid , wean off IV dilaudid  for pain Pancreatic insufficiency - continue CREON  to 36,000u TID with meals, his OP copay should be $0 for this History of alcohol abuse - Last drink reportedly 2 months PTA Chronic health problem GERD: ?taking famotidine  at home Hypertension: continue carvedilol    FEN/GI: Full liquid diet PPx: Lovenox  Dispo:Home pending clinical improvement .  Subjective:  Patient was evaluated at bedside. He reports continued severe epigastric pain that woke him up from sleep which he rates 9/10. He has been able to keep PO full liquids down. He denies N/V, urinary or bowel issues, or any other complaints at this time.   Objective: Temp:  [97.7 F (36.5 C)-98.2 F (36.8 C)] 98.1 F (36.7 C) (08/09 0350) Pulse Rate:  [55-88] 88 (08/09 0350) Resp:  [16] 16 (08/09 0350) BP: (126-165)/(93-105) 126/96 (08/09 0350) SpO2:  [98 %-99 %] 98 % (08/09 0350) Physical Exam: General: A&O, NAD, lying comfortably in bed Cardiovascular: RRR, no m/r/g Respiratory: CTAB, no w/r/r, normal WOB  Abdomen: soft,  moderate TTP of the epigastric and LUQ, mild voluntary guarding Extremities: no peripheral edema Neuro: normal gait  Laboratory: Most recent CBC Lab Results  Component Value Date   WBC 5.3 11/20/2023   HGB 12.5 (L) 11/20/2023   HCT 37.5 (L) 11/20/2023   MCV 103.0 (H) 11/20/2023   PLT 151 11/20/2023   Most recent BMP    Latest Ref Rng & Units 11/22/2023    5:03 AM  BMP  Glucose 70 - 99 mg/dL 895   BUN 6 - 20 mg/dL <5   Creatinine 9.38 - 1.24 mg/dL 9.03   Sodium 864 - 854 mmol/L 134   Potassium 3.5 - 5.1 mmol/L 4.3   Chloride 98 - 111 mmol/L 102   CO2 22 - 32 mmol/L 23   Calcium 8.9 - 10.3 mg/dL 8.8    Lupie Credit, DO 11/23/2023, 6:09 AM  PGY-1, [place] [facility] FPTS Intern pager: [phone], text pages welcome Secure chat group [facility] Teaching Service

## 2023-11-22 NOTE — Assessment & Plan Note (Signed)
 GERD: Do not think he was taking famotidine  OP Hypertension: Continue carvedilol  today

## 2023-11-22 NOTE — Plan of Care (Signed)

## 2023-11-22 NOTE — Assessment & Plan Note (Addendum)
 He had worsening of pain and vomitting with eating yesterday; however, CT did not show any changes in the pancreas compared to admission. - FLD today - phenergan  12.5 mg q6h PO - pain: tylenol  1000 mg q6h, dilaudid  po for moderate pain and IV for severe pain

## 2023-11-23 DIAGNOSIS — K859 Acute pancreatitis without necrosis or infection, unspecified: Secondary | ICD-10-CM | POA: Diagnosis not present

## 2023-11-23 DIAGNOSIS — K861 Other chronic pancreatitis: Secondary | ICD-10-CM | POA: Diagnosis not present

## 2023-11-23 MED ORDER — ORAL CARE MOUTH RINSE: 15.0000 mL | OROMUCOSAL | Status: DC | PRN

## 2023-11-23 MED ORDER — HYDROMORPHONE HCL 1 MG/ML IJ SOLN
0.5000 mg | INTRAMUSCULAR | Status: DC | PRN
  Administered 2023-11-23 (×3): 0.5 mg via INTRAVENOUS
  Filled 2023-11-23 (×3): qty 0.5

## 2023-11-23 NOTE — Plan of Care (Signed)

## 2023-11-24 DIAGNOSIS — K859 Acute pancreatitis without necrosis or infection, unspecified: Secondary | ICD-10-CM | POA: Diagnosis not present

## 2023-11-24 DIAGNOSIS — K861 Other chronic pancreatitis: Secondary | ICD-10-CM | POA: Diagnosis not present

## 2023-11-24 LAB — CBC
HCT: 32.1 % — ABNORMAL LOW (ref 39.0–52.0)
Hemoglobin: 11 g/dL — ABNORMAL LOW (ref 13.0–17.0)
MCH: 34.8 pg — ABNORMAL HIGH (ref 26.0–34.0)
MCHC: 34.3 g/dL (ref 30.0–36.0)
MCV: 101.6 fL — ABNORMAL HIGH (ref 80.0–100.0)
Platelets: 174 K/uL (ref 150–400)
RBC: 3.16 MIL/uL — ABNORMAL LOW (ref 4.22–5.81)
RDW: 16.8 % — ABNORMAL HIGH (ref 11.5–15.5)
WBC: 4.4 K/uL (ref 4.0–10.5)
nRBC: 0 % (ref 0.0–0.2)

## 2023-11-24 LAB — MAGNESIUM: Magnesium: 1.8 mg/dL (ref 1.7–2.4)

## 2023-11-24 LAB — COMPREHENSIVE METABOLIC PANEL WITH GFR
ALT: 29 U/L (ref 0–44)
AST: 35 U/L (ref 15–41)
Albumin: 3 g/dL — ABNORMAL LOW (ref 3.5–5.0)
Alkaline Phosphatase: 64 U/L (ref 38–126)
Anion gap: 8 (ref 5–15)
BUN: 8 mg/dL (ref 6–20)
CO2: 26 mmol/L (ref 22–32)
Calcium: 9.1 mg/dL (ref 8.9–10.3)
Chloride: 104 mmol/L (ref 98–111)
Creatinine, Ser: 1.26 mg/dL — ABNORMAL HIGH (ref 0.61–1.24)
GFR, Estimated: >60 mL/min (ref >60)
Glucose, Bld: 118 mg/dL — ABNORMAL HIGH (ref 70–99)
Potassium: 3.9 mmol/L (ref 3.5–5.1)
Sodium: 138 mmol/L (ref 135–145)
Total Bilirubin: 0.5 mg/dL (ref 0.0–1.2)
Total Protein: 5.8 g/dL — ABNORMAL LOW (ref 6.5–8.1)

## 2023-11-24 MED ORDER — LACTATED RINGERS IV SOLN: INTRAVENOUS | Status: AC

## 2023-11-24 MED ORDER — HYDROMORPHONE HCL 2 MG PO TABS
1.0000 mg | ORAL_TABLET | Freq: Once | ORAL | Status: AC
  Administered 2023-11-25: 1 mg via ORAL
  Filled 2023-11-24: qty 1

## 2023-11-24 MED ORDER — FAMOTIDINE 20 MG PO TABS
10.0000 mg | ORAL_TABLET | Freq: Two times a day (BID) | ORAL | Status: DC
  Administered 2023-11-24 – 2023-11-30 (×20): 10 mg via ORAL
  Filled 2023-11-24 (×13): qty 1

## 2023-11-24 MED ORDER — HYDROMORPHONE HCL 2 MG PO TABS
1.0000 mg | ORAL_TABLET | Freq: Once | ORAL | Status: AC
  Administered 2023-11-24: 1 mg via ORAL
  Filled 2023-11-24: qty 1

## 2023-11-24 NOTE — Progress Notes (Addendum)
 Patient is reporting dark red urine x2. Reports past history of blood in urine. Reports 1 occurrence in May 2025 and one other occurrence about a year ago. Patient was referred to urologist in may. Patient was informed by his Urologist blood thinners might be the cause of blood in Urine and possibly dehydration. Denies discomfort. AOX4.    Latest Reference Range & Units 11/24/23 22:20  Urinalysis, Routine w reflex microscopic  Rpt !  Appearance CLEAR  CLEAR  Bilirubin Urine NEGATIVE  NEGATIVE  Color, Urine YELLOW  STRAW !  Glucose, UA NEGATIVE mg/dL NEGATIVE  Hgb urine dipstick NEGATIVE  MODERATE !  Ketones, ur NEGATIVE mg/dL NEGATIVE  Leukocytes,Ua NEGATIVE  NEGATIVE  Nitrite NEGATIVE  NEGATIVE  pH 5.0 - 8.0  7.0  Protein NEGATIVE mg/dL NEGATIVE  Specific Gravity, Urine 1.005 - 1.030  1.009  Bacteria, UA NONE SEEN  NONE SEEN  RBC / HPF 0 - 5 RBC/hpf 11-20  Squamous Epithelial / HPF 0 - 5 /HPF 0-5  WBC, UA 0 - 5 WBC/hpf 0-5  !: Data is abnormal Rpt: View report in Results Review for more information

## 2023-11-24 NOTE — Assessment & Plan Note (Addendum)
 Tolerating p.o. diet without vomiting, still having some pain.  Trial off IV pain medication today. - PO trial with regular diet today - phenergan  12.5 mg q6h PO - pain: tylenol  1000 mg q6h, 1 mg PO dilaudid  - Follow-up with [facility] outpatient

## 2023-11-24 NOTE — Plan of Care (Addendum)
 Notified by nursing patient was having 10/10 pain.  Went to evaluate patient at bedside.  Patient was ambulating in room, reporting epigastric/back pain after eating chicken.  P.o. Dilaudid  was not helpful.  No vomiting.  Discussed with patient that given the fact that he continues to have significant pain while eating, despite narcotic medication I strongly recommend he goes back to n.p.o. status and remains n.p.o. until he is able to tolerate a diet without any pain.  Plan: Pancreatitis - N.p.o. status, sips with meds as he is tolerating meds - May need to make completely n.p.o., and transition meds to IV equivalents if necessary - Will continue with p.o. pain medication at this time.  Transition back to IV if absolute necessary, would not recommend eating while taking IV pain medication, as qualification to eat should be relatively pain-free or only needing minimal pain relief. - Will resume IV maintenance fluids

## 2023-11-24 NOTE — Assessment & Plan Note (Signed)
-   continue CREON  to 36,000u TID with meals, his OP copay should be $0 for this

## 2023-11-24 NOTE — Assessment & Plan Note (Signed)
-   Last drink reportedly 2 months PTA

## 2023-11-24 NOTE — Assessment & Plan Note (Addendum)
 GERD: Added back famotidine  today Hypertension: continue carvedilol , follow-up outpatient

## 2023-11-24 NOTE — Plan of Care (Signed)
  Problem: Elimination: Goal: Will not experience complications related to urinary retention Outcome: Progressing   Problem: Nutrition: Goal: Adequate nutrition will be maintained Outcome: Progressing

## 2023-11-24 NOTE — Progress Notes (Addendum)
     Daily Progress Note Intern Pager: [phone]  Patient name: Kyle Berry Medical record number: [id number] Date of birth: April 09, 1971 Age: 53 y.o. Gender: male  Primary Care Provider: [facility] Consultants: None Code Status: Full  Pt Overview and Major Events to Date:  8/4: Admitted  Assessment and Plan:  25M male admitted for acute on chronic pancreatitis.  Tolerating regular diet, trial off IV pain meds today.  Likely discharge today or tomorrow, pending p.o. intake and pain control. Assessment & Plan Acute on chronic pancreatitis (HCC) Tolerating p.o. diet without vomiting, still having some pain.  Trial off IV pain medication today. - PO trial with regular diet today - phenergan  12.5 mg q6h PO - pain: tylenol  1000 mg q6h, 1 mg PO dilaudid  - Follow-up with [facility] outpatient Pancreatic insufficiency - continue CREON  to 36,000u TID with meals, his OP copay should be $0 for this History of alcohol abuse - Last drink reportedly 2 months PTA Chronic health problem GERD: Added back famotidine  today Hypertension: continue carvedilol , follow-up outpatient   FEN/GI: Regular PPx: Lovenox  Dispo:Home pending p.o. challenge   Subjective:  Was able to eat 2 sandwiches last night, no vomiting.  Still having pain.  Pain only occurs after eating.  Objective: Temp:  [98 F (36.7 C)-98.9 F (37.2 C)] 98.3 F (36.8 C) (08/10 0728) Pulse Rate:  [62-68] 64 (08/10 0728) Resp:  [16-20] 20 (08/10 0728) BP: (142-161)/(91-97) 150/94 (08/10 0728) SpO2:  [94 %-99 %] 94 % (08/10 0728) Physical Exam: General: NAD, standing up brushing teeth Cardiovascular: RRR, no murmurs Respiratory: CTAB, normal work breathing room air Abdomen: Soft, not tender, not stented Extremities: Moving all 4 extremities, no peripheral edema  Laboratory: Most recent CBC Lab Results  Component Value Date   WBC 4.4 11/24/2023   HGB 11.0 (L) 11/24/2023   HCT 32.1 (L)  11/24/2023   MCV 101.6 (H) 11/24/2023   PLT 174 11/24/2023   Most recent BMP    Latest Ref Rng & Units 11/24/2023    5:07 AM  BMP  Glucose 70 - 99 mg/dL 881   BUN 6 - 20 mg/dL 8   Creatinine 9.38 - 8.75 mg/dL 8.73   Sodium 864 - 854 mmol/L 138   Potassium 3.5 - 5.1 mmol/L 3.9   Chloride 98 - 111 mmol/L 104   CO2 22 - 32 mmol/L 26   Calcium 8.9 - 10.3 mg/dL 9.1    Howell Lunger, DO 11/24/2023, 7:47 AM  PGY-3, [place] [facility] FPTS Intern pager: [phone], text pages welcome Secure chat group [facility] Teaching Service

## 2023-11-25 ENCOUNTER — Encounter (HOSPITAL_COMMUNITY): Payer: Self-pay

## 2023-11-25 DIAGNOSIS — K861 Other chronic pancreatitis: Secondary | ICD-10-CM | POA: Diagnosis not present

## 2023-11-25 DIAGNOSIS — E440 Moderate protein-calorie malnutrition: Secondary | ICD-10-CM | POA: Insufficient documentation

## 2023-11-25 DIAGNOSIS — K859 Acute pancreatitis without necrosis or infection, unspecified: Secondary | ICD-10-CM | POA: Diagnosis not present

## 2023-11-25 LAB — COMPREHENSIVE METABOLIC PANEL WITH GFR
ALT: 25 U/L (ref 0–44)
AST: 27 U/L (ref 15–41)
Albumin: 3 g/dL — ABNORMAL LOW (ref 3.5–5.0)
Alkaline Phosphatase: 61 U/L (ref 38–126)
Anion gap: 8 (ref 5–15)
BUN: 6 mg/dL (ref 6–20)
CO2: 21 mmol/L — ABNORMAL LOW (ref 22–32)
Calcium: 8.7 mg/dL — ABNORMAL LOW (ref 8.9–10.3)
Chloride: 106 mmol/L (ref 98–111)
Creatinine, Ser: 1.13 mg/dL (ref 0.61–1.24)
GFR, Estimated: >60 mL/min (ref >60)
Glucose, Bld: 154 mg/dL — ABNORMAL HIGH (ref 70–99)
Potassium: 3.9 mmol/L (ref 3.5–5.1)
Sodium: 135 mmol/L (ref 135–145)
Total Bilirubin: 0.5 mg/dL (ref 0.0–1.2)
Total Protein: 5.7 g/dL — ABNORMAL LOW (ref 6.5–8.1)

## 2023-11-25 LAB — URINALYSIS, ROUTINE W REFLEX MICROSCOPIC
Bacteria, UA: NONE SEEN
Bilirubin Urine: NEGATIVE
Glucose, UA: NEGATIVE mg/dL
Ketones, ur: NEGATIVE mg/dL
Leukocytes,Ua: NEGATIVE
Nitrite: NEGATIVE
Protein, ur: NEGATIVE mg/dL
Specific Gravity, Urine: 1.009 (ref 1.005–1.030)
pH: 7 (ref 5.0–8.0)

## 2023-11-25 LAB — CBC
HCT: 31.6 % — ABNORMAL LOW (ref 39.0–52.0)
Hemoglobin: 10.6 g/dL — ABNORMAL LOW (ref 13.0–17.0)
MCH: 34.4 pg — ABNORMAL HIGH (ref 26.0–34.0)
MCHC: 33.5 g/dL (ref 30.0–36.0)
MCV: 102.6 fL — ABNORMAL HIGH (ref 80.0–100.0)
Platelets: 174 K/uL (ref 150–400)
RBC: 3.08 MIL/uL — ABNORMAL LOW (ref 4.22–5.81)
RDW: 16.8 % — ABNORMAL HIGH (ref 11.5–15.5)
WBC: 5.1 K/uL (ref 4.0–10.5)
nRBC: 0 % (ref 0.0–0.2)

## 2023-11-25 MED ORDER — HYDROMORPHONE HCL 1 MG/ML IJ SOLN
0.5000 mg | INTRAMUSCULAR | Status: DC | PRN
  Administered 2023-11-25 – 2023-11-29 (×27): 0.5 mg via INTRAVENOUS
  Filled 2023-11-25 (×16): qty 0.5

## 2023-11-25 MED ORDER — LACTATED RINGERS IV SOLN: INTRAVENOUS | Status: AC

## 2023-11-25 NOTE — Assessment & Plan Note (Signed)
-   continue CREON  to 36,000u TID with meals, his OP copay should be $0 for this

## 2023-11-25 NOTE — Assessment & Plan Note (Signed)
 Now NPO, reporting pain is 9/10. PO pain meds do not help his pain. - NPO - Consider celiac plexus block given continued failed PO trials - phenergan  12.5 mg q6h PO - pain: tylenol  1000 mg q6h, 1 mg PO dilaudid  - Follow-up with [facility] outpatient

## 2023-11-25 NOTE — Progress Notes (Signed)
 Initial Nutrition Assessment  DOCUMENTATION CODES:   Non-severe (moderate) malnutrition in context of chronic illness  INTERVENTION:   - Once diet advanced to clear liquids, recommend Boost Breeze po TID, each supplement provides 250 kcal and 9 grams of protein  - Once diet advanced to full liquids, recommend changing supplement to Ensure Plus High Protein po TID, each supplement provides 350 kcal and 20 grams of protein  - Once able to take POs, recommend MVI with minerals and B-complex with vitamin C  - Please obtain admission weight  If unable to advance diet past clear liquids in the next 24-48 hours, recommend placement of gastric vs post-pyloric Cortrak tube and initiation of enteral nutrition. If pt still experiencing significant pain and N/V, recommend post-pyloric placement. Recommend: - Start Osmolite 1.5 @ 15 mL/hr and increase rate by 10 mL/hr every 6 hours to goal rate of 65 mL/hr (1560 mL/day) - PROSource TF20 60 mL daily  Recommended tube feeding regimen at goal rate would provide 2420 kcal, 118 grams of protein, and 1189 ml of H2O.   NUTRITION DIAGNOSIS:   Moderate Malnutrition related to chronic illness (chronic pancreatitis, gastroparesis, pancreatic insufficiency) as evidenced by moderate fat depletion, moderate muscle depletion.  GOAL:   Patient will meet greater than or equal to 90% of their needs  MONITOR:   Diet advancement, Supplement acceptance, PO intake, Labs, Weight trends, I & O's  REASON FOR ASSESSMENT:   NPO/Clear Liquid Diet    ASSESSMENT:   53 year old male who presented to the ED on 11/18/23 with bilateral flank pain, fever, N/V. PMH of chronic pancreatitis on Creon  and s/p Celiac plexus block in July 2024, HTN, GERD, HLD, gastroparesis, congenital single kidney, nephrolithiasis, EtOH abuse, s/p cholecystectomy, pancreatic insufficiency. Pt admitted with acute on chronic pancreatitis.  8/04 - NPO, clear liquids 8/05 - full liquids 8/06 -  Regular diet 8/07 - clear liquids 8/08 - full liquids 8/09 - Regular diet 8/10 - NPO  Spoke with pt at bedside. Pt reports pain is better after IV pain medication. He has been through this before and that he thinks he ate too much too quickly. Noted pt has been transitioned from clears to fulls to regular diet twice now and has failed both times due to pain. Pt currently NPO. Pt reports eating well on Saturday (3 courses). Pt states that whenever he is allowed to eat and has a diet order, he eats well until the pain comes. For example, he states that he tried to eat some of everything on his breakfast tray yesterday but that he felt the pain and nausea coming back.  Pt reports that at home, his appetite is variable. Some days he eats really well while other days he does not. He usually eats 2 main meals (breakfast and dinner) and a snack for lunch. Pt states he has Ensure supplements at home. Pt shares that he started back on Creon  this admission due to being unable to get this medication in the outpatient setting.  Pt endorses weight loss but states that his weight tends to fluctuate between 160-179 lbs. Reviewed weight history in chart. Most recent weight of 76.2 kg from 10/21/23 is pt's highest documented weight over the last 10 months. No weights documented this admission. Pt states that he loses weight when he has a flare but usually gains the weight back.  Pt will drink oral nutrition supplements when given the opportunity. He likes Boost Breeze if on a clear liquid diet and Ensure Plus High Protein  if on a full liquid or Regular diet. RD will monitor for diet advancement and add oral nutrition supplements as appropriate.  Given frequent changes in diet order since admission (x 7 days) and concern for inadequate intake, recommend considering gastric vs post-pyloric Cortrak placement and initiation of enteral nutrition within the next 24-48 hours if unable to advance pt's diet past clear liquids.  If pt still experiencing significant pain and N/V, recommend post-pyloric placement. Pt currently meets criteria for moderate malnutrition but concerned that malnutrition may worsen if pt unable to received adequate nutrition soon.  Meal Completion: 25% x 1 meal on 11/17/13  Medications reviewed and include: vitamin B12 500 mcg daily, pepcid  10 mg BID, Creon  36,000 units TID before meals IVF: LR @ 100 mL/hr  Labs reviewed: vitamin B12 165 (low)  NUTRITION - FOCUSED PHYSICAL EXAM:  Flowsheet Row Most Recent Value  Orbital Region No depletion  Upper Arm Region Moderate depletion  Thoracic and Lumbar Region Moderate depletion  Buccal Region Mild depletion  Temple Region No depletion  Clavicle Bone Region Mild depletion  Clavicle and Acromion Bone Region Mild depletion  Scapular Bone Region Mild depletion  Dorsal Hand Mild depletion  Patellar Region Moderate depletion  Anterior Thigh Region Moderate depletion  Posterior Calf Region Moderate depletion  Edema (RD Assessment) None  Hair Reviewed  Eyes Reviewed  Mouth Reviewed  Skin Reviewed  Nails Reviewed    Diet Order:   Diet Order             Diet NPO time specified Except for: Sips with Meds  Diet effective now                   EDUCATION NEEDS:   Education needs have been addressed  Skin:  Skin Assessment: Reviewed RN Assessment  Last BM:  11/23/23  Height:   Ht Readings from Last 1 Encounters:  10/21/23 6' 1 (1.854 m)    Weight:   Wt Readings from Last 1 Encounters:  10/21/23 76.2 kg    BMI:  There is no height or weight on file to calculate BMI.  Estimated Nutritional Needs:   Kcal:  7749-7549  Protein:  100-120 grams  Fluid:  >2.2 L    Mallie Satchel, MS, RD, LDN Registered Dietitian II Please see AMiON for contact information.

## 2023-11-25 NOTE — Progress Notes (Signed)
     Daily Progress Note Intern Pager: [phone]  Patient name: Kyle Berry Medical record number: [id number] Date of birth: 1970/04/24 Age: 53 y.o. Gender: male  Primary Care Provider: [facility] Consultants: none Code Status: full  Pt Overview and Major Events to Date:  8/4 - admitted  Assessment and Plan:  53 yo male admitted with acute on chronic pancreatitis who has failed PO attempt twice now during admission due to pain.  Assessment & Plan Acute on chronic pancreatitis (HCC) Now NPO, reporting pain is 9/10. PO pain meds do not help his pain. - NPO - Consider celiac plexus block given continued failed PO trials - phenergan  12.5 mg q6h PO - pain: tylenol  1000 mg q6h, 1 mg PO dilaudid  - Follow-up with [facility] outpatient Pancreatic insufficiency - continue CREON  to 36,000u TID with meals, his OP copay should be $0 for this History of alcohol abuse - Last drink reportedly 2 months PTA Chronic health problem GERD:home famotidine   Hypertension: continue carvedilol , follow-up outpatient  FEN/GI: NPO PPx: lovenox  Dispo:Home pending clinical improvement .   Subjective:  At this point, he reports he has upper abdominal pain that is 9/10 radiating to his back. PO meds are of no benefit at this point. He failed PO again yesterday. Some nausea.   Objective: Temp:  [97.8 F (36.6 C)-98.4 F (36.9 C)] 97.8 F (36.6 C) (08/11 0723) Pulse Rate:  [60-79] 60 (08/11 0723) Resp:  [16-19] 19 (08/11 0723) BP: (139-171)/(90-108) 143/108 (08/11 0723) SpO2:  [97 %-100 %] 99 % (08/11 0723)  Physical Exam: General: well appearing man sleeping in chair on side. Cardiovascular: RRR Respiratory: CTAB Abdomen: soft nontender, significant TP in the epigastrium and RUQ/LUQ  Laboratory: Most recent CBC Lab Results  Component Value Date   WBC 5.1 11/25/2023   HGB 10.6 (L) 11/25/2023   HCT 31.6 (L) 11/25/2023   MCV 102.6 (H) 11/25/2023   PLT 174  11/25/2023   Most recent BMP    Latest Ref Rng & Units 11/25/2023    3:53 AM  BMP  Glucose 70 - 99 mg/dL 845   BUN 6 - 20 mg/dL 6   Creatinine 9.38 - 8.75 mg/dL 8.86   Sodium 864 - 854 mmol/L 135   Potassium 3.5 - 5.1 mmol/L 3.9   Chloride 98 - 111 mmol/L 106   CO2 22 - 32 mmol/L 21   Calcium 8.9 - 10.3 mg/dL 8.7    AST/ALT/T bili wnl  Imaging/Diagnostic Tests: None  Alena Morrison, Orva Gwaltney, MD 11/25/2023, 9:30 AM  PGY-1, [facility] FPTS Intern pager: [phone], text pages welcome Secure chat group [facility] Teaching Service

## 2023-11-25 NOTE — Assessment & Plan Note (Signed)
-   Last drink reportedly 2 months PTA

## 2023-11-25 NOTE — Progress Notes (Signed)
 Internal medicine, Faunce DO notified about patient blood pressure 183/99 and asked if patient can have PRN orders to manage blood pressure. DO aware and no new orders at this time.

## 2023-11-25 NOTE — Progress Notes (Signed)
 Mobility Specialist Progress Note:    11/25/23 1229  Mobility  Activity Ambulated with assistance (In hallway)  Level of Assistance Independent  Assistive Device Other (Comment) (IV Pole)  Distance Ambulated (ft) 500 ft  Activity Response Tolerated well  Mobility Referral Yes  Mobility visit 1 Mobility  Mobility Specialist Start Time (ACUTE ONLY) 1219  Mobility Specialist Stop Time (ACUTE ONLY) 1229  Mobility Specialist Time Calculation (min) (ACUTE ONLY) 10 min   Received pt in bed and agreeable to mobility. No physical assistance needed. C/o pain, unable to specify location, otherwise tolerated well. Returned to room without fault. Left pt EOB. Personal belongings and call light within reach. All needs met.  Lavanda Pollack Mobility Specialist  Please contact via [employer] or  Rehab Office [phone]

## 2023-11-25 NOTE — Plan of Care (Signed)
  Problem: Education: Goal: Knowledge of General Education information will improve Description: Including pain rating scale, medication(s)/side effects and non-pharmacologic comfort measures Outcome: Progressing   Problem: Health Behavior/Discharge Planning: Goal: Ability to manage health-related needs will improve Outcome: Progressing   Problem: Activity: Goal: Risk for activity intolerance will decrease Outcome: Adequate for Discharge   Problem: Nutrition: Goal: Adequate nutrition will be maintained Outcome: Not Progressing

## 2023-11-25 NOTE — Assessment & Plan Note (Signed)
 GERD:home famotidine   Hypertension: continue carvedilol , follow-up outpatient

## 2023-11-26 DIAGNOSIS — K8681 Exocrine pancreatic insufficiency: Secondary | ICD-10-CM

## 2023-11-26 DIAGNOSIS — K859 Acute pancreatitis without necrosis or infection, unspecified: Secondary | ICD-10-CM | POA: Diagnosis not present

## 2023-11-26 DIAGNOSIS — K861 Other chronic pancreatitis: Secondary | ICD-10-CM | POA: Diagnosis not present

## 2023-11-26 LAB — PROTIME-INR
INR: 1 (ref 0.8–1.2)
Prothrombin Time: 13.7 s (ref 11.4–15.2)

## 2023-11-26 LAB — BASIC METABOLIC PANEL WITH GFR
Anion gap: 9 (ref 5–15)
BUN: 7 mg/dL (ref 6–20)
CO2: 26 mmol/L (ref 22–32)
Calcium: 9.3 mg/dL (ref 8.9–10.3)
Chloride: 101 mmol/L (ref 98–111)
Creatinine, Ser: 1.04 mg/dL (ref 0.61–1.24)
GFR, Estimated: >60 mL/min (ref >60)
Glucose, Bld: 112 mg/dL — ABNORMAL HIGH (ref 70–99)
Potassium: 4 mmol/L (ref 3.5–5.1)
Sodium: 136 mmol/L (ref 135–145)

## 2023-11-26 MED ORDER — BOOST / RESOURCE BREEZE PO LIQD CUSTOM
1.0000 | Freq: Three times a day (TID) | ORAL | Status: DC
  Administered 2023-11-27 (×2): 1 via ORAL

## 2023-11-26 MED ORDER — ONDANSETRON HCL 4 MG/2ML IJ SOLN
4.0000 mg | Freq: Three times a day (TID) | INTRAMUSCULAR | Status: DC
  Administered 2023-11-26 – 2023-11-29 (×12): 4 mg via INTRAVENOUS
  Filled 2023-11-26 (×9): qty 2

## 2023-11-26 NOTE — Assessment & Plan Note (Signed)
 Still NPO. Pain requiring IV dilaudid . Potential celiac plexus block today.  - NPO - phenergan  12.5 mg q6h PO - pain: tylenol  1000 mg q6h, 1 mg PO dilaudid , 0.5 IV dilaudid  - Follow-up with [facility] outpatient

## 2023-11-26 NOTE — Assessment & Plan Note (Signed)
-   continue CREON  to 36,000u TID with meals, his OP copay should be $0 for this

## 2023-11-26 NOTE — Progress Notes (Signed)
     Daily Progress Note Intern Pager: [phone]  Patient name: Kyle Berry Medical record number: [id number] Date of birth: 1971/03/26 Age: 53 y.o. Gender: male  Primary Care Provider: [facility] Consultants: GI, IR Code Status: Full  Pt Overview and Major Events to Date:  8/4: admitted  Assessment and Plan:  53 yo male admitted with acute on chronic pancreatitis who has failed 2 PO trials. We reached out to IR who will consider him for a celiac plexus block today.  Assessment & Plan Acute on chronic pancreatitis (HCC) Still NPO. Pain requiring IV dilaudid . Potential celiac plexus block today.  - NPO - phenergan  12.5 mg q6h PO - pain: tylenol  1000 mg q6h, 1 mg PO dilaudid , 0.5 IV dilaudid  - Follow-up with [facility] outpatient Pancreatic insufficiency - continue CREON  to 36,000u TID with meals, his OP copay should be $0 for this Malnutrition of moderate degree RD following, recommend cortrak placement if not POing by 8/13 for nutrition. Hopefully celiac plexus block will be beneficial by then.  Chronic health problem GERD: home famotidine   Hypertension: continue carvedilol , follow-up outpatient  FEN/GI: NPO PPx: holding for potential IR procedure Dispo:Home pending clinical improvement .  Subjective:  He reports his pain is better controlled with IV dilaudid . He is hopeful to try the celiac plexus block for pain relief so he can eat. Three well formed BM yesterday.  Objective: Temp:  [97.6 F (36.4 C)-98.2 F (36.8 C)] 98.1 F (36.7 C) (08/12 0806) Pulse Rate:  [55-79] 79 (08/12 0806) Resp:  [19] 19 (08/12 0421) BP: (137-183)/(87-100) 160/93 (08/12 0806) SpO2:  [97 %-98 %] 98 % (08/12 0806)  Physical Exam: General: well appearing male sleeping in bed Cardiovascular: RRR Respiratory: CTAB, no increased WOB Abdomen: abd soft, tender epigastrium Extremities: no edema  Laboratory: Most recent CBC Lab Results  Component Value  Date   WBC 5.1 11/25/2023   HGB 10.6 (L) 11/25/2023   HCT 31.6 (L) 11/25/2023   MCV 102.6 (H) 11/25/2023   PLT 174 11/25/2023   Most recent BMP    Latest Ref Rng & Units 11/26/2023    7:06 AM  BMP  Glucose 70 - 99 mg/dL 887   BUN 6 - 20 mg/dL 7   Creatinine 9.38 - 8.75 mg/dL 8.95   Sodium 864 - 854 mmol/L 136   Potassium 3.5 - 5.1 mmol/L 4.0   Chloride 98 - 111 mmol/L 101   CO2 22 - 32 mmol/L 26   Calcium 8.9 - 10.3 mg/dL 9.3    Imaging/Diagnostic Tests: None  Alena Morrison, Elio, MD 11/26/2023, 8:47 AM  PGY-1, [facility] FPTS Intern pager: [phone], text pages welcome Secure chat group [facility] Teaching Service

## 2023-11-26 NOTE — Assessment & Plan Note (Deleted)
-   Last drink reportedly 2 months PTA

## 2023-11-26 NOTE — Plan of Care (Signed)
  Problem: Education: Goal: Knowledge of General Education information will improve Description: Including pain rating scale, medication(s)/side effects and non-pharmacologic comfort measures Outcome: Progressing   Problem: Health Behavior/Discharge Planning: Goal: Ability to manage health-related needs will improve Outcome: Progressing   Problem: Clinical Measurements: Goal: Ability to maintain clinical measurements within normal limits will improve Outcome: Progressing Goal: Will remain free from infection Outcome: Progressing Goal: Diagnostic test results will improve Outcome: Progressing Goal: Respiratory complications will improve Outcome: Progressing   Problem: Activity: Goal: Risk for activity intolerance will decrease Outcome: Progressing   Problem: Nutrition: Goal: Adequate nutrition will be maintained Outcome: Progressing   Problem: Coping: Goal: Level of anxiety will decrease Outcome: Progressing   Problem: Elimination: Goal: Will not experience complications related to bowel motility Outcome: Progressing Goal: Will not experience complications related to urinary retention Outcome: Progressing   Problem: Pain Managment: Goal: General experience of comfort will improve and/or be controlled Outcome: Progressing   Problem: Safety: Goal: Ability to remain free from injury will improve Outcome: Progressing   Problem: Skin Integrity: Goal: Risk for impaired skin integrity will decrease Outcome: Progressing

## 2023-11-26 NOTE — Assessment & Plan Note (Addendum)
 GERD:home famotidine   Hypertension: continue carvedilol , follow-up outpatient

## 2023-11-26 NOTE — Assessment & Plan Note (Signed)
 RD following, recommend cortrak placement if not POing by 8/13 for nutrition. Hopefully celiac plexus block will be beneficial by then.

## 2023-11-26 NOTE — Consult Note (Addendum)
 Consultation  Referring Provider: Medicine service/[place] Primary Care Physician:  [facility] Primary Gastroenterologist:  Dr. Wilhelmenia Reason for Consultation: Chronic calcific pancreatitis, persistent abdominal pain, poor oral intake with vomiting  HPI: Kyle Berry is a 52 y.o. male with known history of chronic calcific pancreatitis, and multiple prior hospitalizations with acute on chronic pancreatitis.  He had been admitted in July 2024, and due to persistence of poorly controlled pain did undergo EUS with celiac plexus block per Dr. Arville Roddy which the patient says worked well.  He says he really did not have any abdominal pain for about 6 months after that procedure, then pain gradually returned, worsened over the past few weeks prior to admission.  He also says when he came to the ER he was having some urinary issues and hematuria which prompted the ER visit. Also had an admission in October 2024, left AMA at that time and admitted briefly in December 2024 with an acute exacerbation of pain.  Workup on admit here on 11/18/2023 with normal lipase, unremarkable chemistries  CT of the abdomen pelvis on 11/18/2023 showed a normal-appearing liver, sequelae of chronic pancreatitis with small scattered pancreatic calcifications and mild gland atrophy there were some inflammatory changes of the pancreas consistent with an acute exacerbation no pseudocyst or abscess, 5 mm nonobstructing left renal inferior pole stone no hydronephrosis, and atrophic right kidney.  Repeat CT scan on 11/22/2023 due to persistence of pain showed minimal peripancreatic stranding without worsening compared to the prior exam no organized fluid collections, status postcholecystectomy, few borderline fluid-filled loops of small bowel in the anterior abdomen no transition point.  Labs yesterday with WBC of 5.1/hemoglobin 10.6/hematocrit 31.6/MCV 102/platelets 174 Potassium 3.9/BUN  6/creatinine 1.13 Albumin 3.0 LFTs within normal limits INR 1.0  We are asked to help due to difficulty with diet advancement and pain control. Patient says the IV pain medications do help, he does not get enough relief with the oral medications, and says he usually has not taken the oral pain medication after he is left the hospital with prior admissions and usually just deals with the pain.  Over the past 3 to 4 days he has been having bouts of vomiting which may occur 30 to 40 minutes after eating without any significant increase in abdominal pain.  This was occurring even with the full liquid diet so he was made n.p.o. yesterday.  He is hungry today and feels that he could keep down liquids. Nutrition has been involved and suggestion has been made for core track placement  Service reached out to IR yesterday, they were hesitant to do a celiac plexus block in the setting of component of acute pancreatitis.   Past Medical History:  Diagnosis Date   Atrophy of right kidney    Chronic bronchitis (HCC)    Chronic lower back pain    Chronic pancreatitis (HCC)    Gastroparesis 06/2020   confirmed by emptying study at [facility]   GERD (gastroesophageal reflux disease)    Headache    once/month (05/13/2017)   Hematemesis 10/18/2017   Hematochezia 09/19/2021   High cholesterol    Hypertension    Migraine    a couple/year (05/13/2017)   Nephrolithiasis 05/13/2017   Pancreatitis    Pneumonia ~ 09/2015   Presence of pancreatic duct stent    Recurrent acute pancreatitis    Shortness of breath 05/22/2021    Past Surgical History:  Procedure Laterality Date   BIOPSY  11/02/2020  Procedure: BIOPSY;  Surgeon: Wilhelmenia Aloha Raddle., MD;  Location: [facility] ENDOSCOPY;  Service: Gastroenterology;;   BIOPSY  10/20/2022   Procedure: BIOPSY;  Surgeon: Wilhelmenia Aloha Raddle., MD;  Location: [facility] ENDOSCOPY;  Service: Gastroenterology;;   CHOLECYSTECTOMY N/A 10/23/2017   Procedure: LAPAROSCOPIC  CHOLECYSTECTOMY WITH INTRAOPERATIVE CHOLANGIOGRAM;  Surgeon: Gladis Cough, MD;  Location: [facility] ORS;  Service: General;  Laterality: N/A;   COLONOSCOPY WITH PROPOFOL  N/A 11/02/2019   Procedure: COLONOSCOPY WITH PROPOFOL ;  Surgeon: Abran Norleen SAILOR, MD;  Location: [facility] ENDOSCOPY;  Service: Endoscopy;  Laterality: N/A;   ERCP N/A 11/02/2020   Procedure: ENDOSCOPIC RETROGRADE CHOLANGIOPANCREATOGRAPHY (ERCP);  Surgeon: Wilhelmenia Aloha Raddle., MD;  Location: [facility] ENDOSCOPY;  Service: Gastroenterology;  Laterality: N/A;   ESOPHAGOGASTRODUODENOSCOPY N/A 11/02/2020   Procedure: ESOPHAGOGASTRODUODENOSCOPY (EGD);  Surgeon: Wilhelmenia Aloha Raddle., MD;  Location: [facility] ENDOSCOPY;  Service: Gastroenterology;  Laterality: N/A;   ESOPHAGOGASTRODUODENOSCOPY (EGD) WITH PROPOFOL  N/A 11/06/2019   Procedure: ESOPHAGOGASTRODUODENOSCOPY (EGD) WITH PROPOFOL ;  Surgeon: Abran Norleen SAILOR, MD;  Location: [facility] ENDOSCOPY;  Service: Endoscopy;  Laterality: N/A;   ESOPHAGOGASTRODUODENOSCOPY (EGD) WITH PROPOFOL  N/A 10/20/2022   Procedure: ESOPHAGOGASTRODUODENOSCOPY (EGD) WITH PROPOFOL ;  Surgeon: Wilhelmenia Aloha Raddle., MD;  Location: [facility] ENDOSCOPY;  Service: Gastroenterology;  Laterality: N/A;   HEMOSTASIS CLIP PLACEMENT  11/02/2019   Procedure: HEMOSTASIS CLIP PLACEMENT;  Surgeon: Abran Norleen SAILOR, MD;  Location: [facility] ENDOSCOPY;  Service: Endoscopy;;   NEUROLYTIC CELIAC PLEXUS  10/20/2022   Procedure: NEUROLYTIC CELIAC PLEXUS;  Surgeon: Wilhelmenia Aloha Raddle., MD;  Location: [facility] ENDOSCOPY;  Service: Gastroenterology;;   NO PAST SURGERIES     REMOVAL OF STONES  11/02/2020   Procedure: REMOVAL OF STONES;  Surgeon: Wilhelmenia Aloha Raddle., MD;  Location: [facility] ENDOSCOPY;  Service: Gastroenterology;;   CLEDA REMOVAL  11/02/2020   Procedure: STENT REMOVAL;  Surgeon: Wilhelmenia Aloha Raddle., MD;  Location: [facility] ENDOSCOPY;  Service: Gastroenterology;;   UPPER ESOPHAGEAL ENDOSCOPIC ULTRASOUND (EUS) N/A 10/20/2022   Procedure: UPPER ESOPHAGEAL ENDOSCOPIC ULTRASOUND  (EUS);  Surgeon: Wilhelmenia Aloha Raddle., MD;  Location: [facility] ENDOSCOPY;  Service: Gastroenterology;  Laterality: N/A;    Prior to Admission medications   Medication Sig Start Date End Date Taking? Authorizing Provider  albuterol  (VENTOLIN  HFA) 108 (90 Base) MCG/ACT inhaler Inhale 2 puffs into the lungs every 6 (six) hours as needed for wheezing or shortness of breath.   Yes [provider]  carvedilol  (COREG ) 3.125 MG tablet Take 1 tablet (3.125 mg total) by mouth 2 (two) times daily with a meal. 10/23/22  Yes [name] Toribio GAILS, MD  cephALEXin  (KEFLEX ) 250 MG capsule Take 1 capsule (250 mg total) by mouth daily. 10/21/23  Yes Stoneking, Adine PARAS., MD  famotidine  (PEPCID ) 20 MG tablet Take 1 tablet (20 mg total) by mouth 2 (two) times daily for 30 days, THEN 1 tablet (20 mg total) daily. Patient taking differently: Take 1 tablet (20 mg total) daily as needed for indigestion  10/23/22 11/22/23 Yes [name] Toribio GAILS, MD  gabapentin  (NEURONTIN ) 300 MG capsule Take 300 mg by mouth 3 (three) times daily as needed (Pain).   Yes [provider]  naloxone  (NARCAN ) nasal spray 4 mg/0.1 mL Place 1 spray into the nose daily as needed (opioid reversal). 07/16/22  Yes [provider]  Naphazoline-Pheniramine (VISINE OP) Place 1 drop into both eyes daily.   Yes [provider]  Nutritional Supplements (ENSURE ORIGINAL) LIQD Take 1 Container by mouth 2 (two) times daily.   Yes [provider]  ondansetron  (ZOFRAN -ODT) 4 MG disintegrating tablet Take  1 tablet (4 mg total) by mouth every 8 (eight) hours as needed for nausea or vomiting. 09/09/23  Yes Horton, Charmaine FALCON, MD  promethazine  (PHENERGAN ) 25 MG tablet Take 1 tablet (25 mg total) by mouth every 6 (six) hours as needed for nausea or vomiting. 03/27/23  Yes Raenelle Coria, MD    Current Facility-Administered Medications  Medication Dose Route Frequency Provider Last Rate Last Admin   acetaminophen  (TYLENOL ) tablet 1,000  mg  1,000 mg Oral Q6H Everhart, Kirstie, DO   1,000 mg at 11/26/23 1256   carvedilol  (COREG ) tablet 3.125 mg  3.125 mg Oral BID WC Everhart, Kirstie, DO   3.125 mg at 11/26/23 9077   cyanocobalamin  (VITAMIN B12) tablet 500 mcg  500 mcg Oral Daily Everhart, Kirstie, DO   500 mcg at 11/26/23 9077   enoxaparin  (LOVENOX ) injection 40 mg  40 mg Subcutaneous Q24H Donzetta Rollene BRAVO, MD   40 mg at 11/23/23 2236   famotidine  (PEPCID ) tablet 10 mg  10 mg Oral BID Howell Lunger, DO   10 mg at 11/26/23 9077   feeding supplement (BOOST / RESOURCE BREEZE) liquid 1 Container  1 Container Oral TID BM Esterwood, Amy S, PA-C       HYDROmorphone  (DILAUDID ) injection 0.5 mg  0.5 mg Intravenous Q3H PRN Everhart, Kirstie, DO   0.5 mg at 11/26/23 1256   HYDROmorphone  (DILAUDID ) tablet 1 mg  1 mg Oral Q4H PRN Everhart, Kirstie, DO   1 mg at 11/25/23 9297   lactated ringers  infusion   Intravenous Continuous Everhart, Kirstie, DO 100 mL/hr at 11/26/23 0352 Infusion Verify at 11/26/23 0352   lipase/protease/amylase (CREON ) capsule 36,000 Units  36,000 Units Oral TID THEOPOLIS Howell Lunger, DO   36,000 Units at 11/24/23 1631   ondansetron  (ZOFRAN ) injection 4 mg  4 mg Intravenous Q8H Esterwood, Amy S, PA-C       Oral care mouth rinse  15 mL Mouth Rinse PRN Donah Laymon PARAS, MD        Allergies as of 11/18/2023 - Review Complete 11/18/2023  Allergen Reaction Noted   Norvasc  [amlodipine  besylate] Other (See Comments) 08/15/2016   Voltaren  [diclofenac ] Hives and Other (See Comments) 12/14/2013   Prilosec [omeprazole] Other (See Comments) 08/15/2016   Hydrochlorothiazide Itching 09/19/2021   Ultram  [tramadol ] Other (See Comments) 09/24/2022   Prednisone  Hives and Other (See Comments) 12/14/2013    Family History  Problem Relation Age of Onset   Hypertension Other    Hypertension Mother    Hypertension Father    Kidney disease Father    Hypertension Sister    Diabetes Sister    Hypertension Brother    Pancreatic  cancer Paternal Grandmother    Colon cancer Cousin    Stomach cancer Neg Hx    Esophageal cancer Neg Hx     Social History   Socioeconomic History   Marital status: Single    Spouse name: Not on file   Number of children: Not on file   Years of education: Not on file   Highest education level: Not on file  Occupational History   Occupation: Horticulturist, commercial  Tobacco Use   Smoking status: Never   Smokeless tobacco: Never  Vaping Use   Vaping status: Never Used  Substance and Sexual Activity   Alcohol use: Not Currently    Comment: remote h/o heavy use   Drug use: No   Sexual activity: Yes    Partners: Female    Birth control/protection: Condom  Other Topics Concern  Not on file  Social History Narrative   Not on file   Social Drivers of Health   Financial Resource Strain: Not at Risk (12/03/2022)   Received from [employer]    How hard is it for you to pay for the very basics like food, housing, heating, medical care, and medications?: 1  Food Insecurity: No Food Insecurity (11/18/2023)   Hunger Vital Sign    Worried About Running Out of Food in the Last Year: Never true    Ran Out of Food in the Last Year: Never true  Transportation Needs: No Transportation Needs (11/18/2023)   PRAPARE - Administrator, Civil Service (Medical): No    Lack of Transportation (Non-Medical): No  Physical Activity: Not on File (08/03/2021)   Received from [facility]   Physical Activity    Physical Activity: 0  Stress: Not on File (08/03/2021)   Received from [facility]   Stress    Stress: 0  Social Connections: Not on File (12/24/2022)   Received from [facility]   Social Connections    Connectedness: 0  Intimate Partner Violence: Not At Risk (11/18/2023)   Humiliation, Afraid, Rape, and Kick questionnaire    Fear of Current or Ex-Partner: No    Emotionally Abused: No    Physically Abused: No    Sexually Abused: No    Review of Systems: Pertinent positive and  negative review of systems were noted in the above HPI section.  All other review of systems was otherwise negative.  Physical Exam: Vital signs in last 24 hours: Temp:  [97.6 F (36.4 C)-98.2 F (36.8 C)] 98.1 F (36.7 C) (08/12 0806) Pulse Rate:  [55-79] 79 (08/12 0806) Resp:  [19] 19 (08/12 0421) BP: (137-183)/(87-100) 160/93 (08/12 0806) SpO2:  [97 %-98 %] 98 % (08/12 0806) Last BM Date : 11/25/23 (stated by patient) General:   Alert,  Well-developed, [name] pleasant and cooperative in NAD comfortable appearing Head:  Normocephalic and atraumatic. Eyes:  Sclera clear, no icterus.   Conjunctiva pink. Ears:  Normal auditory acuity. Nose:  No deformity, discharge,  or lesions. Mouth:  No deformity or lesions.   Neck:  Supple; no masses or thyromegaly. Lungs:  Clear throughout to auscultation.   No wheezes, crackles, or rhonchi.  Heart:  Regular rate and rhythm; no murmurs, clicks, rubs,  or gallops. Abdomen:  Soft, nondistended, minimal tenderness epigastrium/hypogastrium, BS active,nonpalp mass or hsm.   Rectal: Not done Msk:  Symmetrical without gross deformities. . Pulses:  Normal pulses noted. Extremities:  Without clubbing or edema. Neurologic:  Alert and  oriented x4;  grossly normal neurologically. Skin:  Intact without significant lesions or rashes.. Psych:  Alert and cooperative. Normal mood and affect.  Intake/Output from previous day: 08/11 0701 - 08/12 0700 In: 1020 [I.V.:1020] Out: 500 [Urine:500] Intake/Output this shift: No intake/output data recorded.  Lab Results: Recent Labs    11/24/23 0507 11/25/23 0353  WBC 4.4 5.1  HGB 11.0* 10.6*  HCT 32.1* 31.6*  PLT 174 174   BMET Recent Labs    11/24/23 0507 11/25/23 0353 11/26/23 0706  NA 138 135 136  K 3.9 3.9 4.0  CL 104 106 101  CO2 26 21* 26  GLUCOSE 118* 154* 112*  BUN 8 6 7   CREATININE 1.26* 1.13 1.04  CALCIUM 9.1 8.7* 9.3   LFT Recent Labs    11/25/23 0353  PROT  5.7*  ALBUMIN 3.0*  AST 27  ALT 25  ALKPHOS 61  BILITOT 0.5   PT/INR Recent Labs    11/26/23 0706  LABPROT 13.7  INR 1.0   Hepatitis Panel No results for input(s): HEPBSAG, HCVAB, HEPAIGM, HEPBIGM in the last 72 hours.   IMPRESSION:  #81 53 year old African-American male with chronic calcific pancreatitis admitted 8 days ago with progressive pain over the previous 2 to 3 weeks and lower level pain over the past 6 months.  He does have imaging changes of mild acute on chronic pancreatitis, no associated fluid collections. Labs have been reassuring even on admission with normal lipase, normal LFTs and CBC.  Patient did get good result from previous celiac block done July 2024 with no pain for at least 6 months postprocedure  #2 postprandial vomiting-does not have any evidence on CT scan of outlet obstruction or duodenal inflammatory changes May have some delayed emptying in setting of acute pancreatitis and narcotics.  Plan; restart clear liquid diet with resource supplements between meals Start Zofran  4 mg every 6 hours around-the-clock Could consider adding trial of metoclopramide  but will try Zofran  first. Am hopeful if we can have him to keep down liquids for 24 hours he may not require postpyloric feedings. For now continue pain control as needed Have reached out to Dr. Wilhelmenia regarding scheduling another celiac plexus block which will likely be several weeks out.  Patient is aware.  GI will follow-up in a.m.      Amy EsterwoodPA-C  11/26/2023, 1:33 PM     Attending physician's note   I have taken history, reviewed the chart and examined the patient. I performed a substantive portion of this encounter, including complete performance of at least one of the key components, in conjunction with the APP. I agree with the Advanced Practitioner's note, impression and recommendations.   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 CT this adm without any new fluid collection or significant peripancreatic stranding.  Lipase all was normal.  Plan: - Tolerating full liquids.  Advance diet to low-fat diet in AM. No need for cortrak - Continue supportive treatment. - Once able to tolerate p.o., resume Creon  (same as home dose), add omeprazole 20 mg QAM. - Anticipate D/C in AM - Will arrange for EUS guided celiac block as outpt with Dr Wilhelmenia. Not a candidate for IR guided celiac block.   Anselm Bring, MD [facility] [phone]

## 2023-11-27 ENCOUNTER — Other Ambulatory Visit (HOSPITAL_COMMUNITY): Payer: Self-pay

## 2023-11-27 ENCOUNTER — Encounter (HOSPITAL_COMMUNITY): Payer: Self-pay

## 2023-11-27 ENCOUNTER — Other Ambulatory Visit: Admitting: Urology

## 2023-11-27 ENCOUNTER — Telehealth (HOSPITAL_COMMUNITY): Payer: Self-pay | Admitting: Pharmacy Technician

## 2023-11-27 DIAGNOSIS — K8681 Exocrine pancreatic insufficiency: Secondary | ICD-10-CM | POA: Diagnosis not present

## 2023-11-27 DIAGNOSIS — K861 Other chronic pancreatitis: Secondary | ICD-10-CM | POA: Diagnosis not present

## 2023-11-27 DIAGNOSIS — K859 Acute pancreatitis without necrosis or infection, unspecified: Secondary | ICD-10-CM | POA: Diagnosis not present

## 2023-11-27 LAB — BASIC METABOLIC PANEL WITH GFR
Anion gap: 10 (ref 5–15)
BUN: 6 mg/dL (ref 6–20)
CO2: 25 mmol/L (ref 22–32)
Calcium: 9.1 mg/dL (ref 8.9–10.3)
Chloride: 102 mmol/L (ref 98–111)
Creatinine, Ser: 1.11 mg/dL (ref 0.61–1.24)
GFR, Estimated: >60 mL/min (ref >60)
Glucose, Bld: 98 mg/dL (ref 70–99)
Potassium: 3.7 mmol/L (ref 3.5–5.1)
Sodium: 137 mmol/L (ref 135–145)

## 2023-11-27 MED ORDER — MELATONIN 3 MG PO TABS
3.0000 mg | ORAL_TABLET | Freq: Every day | ORAL | Status: DC
  Administered 2023-11-27 – 2023-11-29 (×4): 3 mg via ORAL
  Filled 2023-11-27 (×3): qty 1

## 2023-11-27 MED ORDER — ADULT MULTIVITAMIN W/MINERALS CH
1.0000 | ORAL_TABLET | Freq: Every day | ORAL | Status: DC
  Administered 2023-11-27 – 2023-11-30 (×5): 1 via ORAL
  Filled 2023-11-27 (×4): qty 1

## 2023-11-27 MED ORDER — ENSURE PLUS HIGH PROTEIN PO LIQD
237.0000 mL | Freq: Three times a day (TID) | ORAL | Status: DC
  Administered 2023-11-27 – 2023-11-30 (×10): 237 mL via ORAL

## 2023-11-27 NOTE — Plan of Care (Signed)

## 2023-11-27 NOTE — Progress Notes (Addendum)
     Daily Progress Note Intern Pager: [phone]  Patient name: Kyle Berry Medical record number: [id number] Date of birth: Jun 07, 1970 Age: 53 y.o. Gender: male  Primary Care Provider: [facility] Consultants: GI Code Status: full  Pt Overview and Major Events to Date:  8/4 - admitted 8/7 - failed PO 8/10 - failed PO  Assessment and Plan:  53 yo male with acute on chronic pancreatitis, trialing PO again today. Assessment & Plan Acute on chronic pancreatitis [facility]) Patient failed PO trial on 11/20/23 and again around 11/25/23. Tolerated clear liquid diet last night, but requiring frequent IV pain meds. - GI following - advance to full liquid diet today - still has PRN IV meds, tho we discussed avoiding those to plan for discharge - GI will set up OP celiac plexus block Pancreatic insufficiency - GI recommending his home dose of creon  with PO - Plan for 76k with meals and 36k with snacks Malnutrition of moderate degree RD following. Advancing diet today, so do not think he needs a cortrak. Will continue to evaluate Chronic health problem GERD: home famotidine   Hypertension: continue carvedilol , follow-up outpatient  FEN/GI: low fat diet PPx: lovenox  Dispo:Home pending clinical improvement .   Subjective:  9/10 pain right now. Tolerated liquids. Will try advancing diet today  Objective: Temp:  [97.8 F (36.6 C)-98.3 F (36.8 C)] 98.2 F (36.8 C) (08/13 0335) Pulse Rate:  [56-74] 56 (08/13 0335) Resp:  [18] 18 (08/13 0335) BP: (119-153)/(78-94) 139/86 (08/13 0335) SpO2:  [98 %] 98 % (08/13 0335) Weight:  [76.2 kg] 76.2 kg (08/12 2354)  Physical Exam: General: lying in bed sleeping  Cardiovascular: RRR Respiratory: CTAB Abdomen: epigastric tenderness, no distension Extremities: no edema  Laboratory: Most recent CBC Lab Results  Component Value Date   WBC 5.1 11/25/2023   HGB 10.6 (L) 11/25/2023   HCT 31.6 (L) 11/25/2023    MCV 102.6 (H) 11/25/2023   PLT 174 11/25/2023   Most recent BMP    Latest Ref Rng & Units 11/27/2023    5:15 AM  BMP  Glucose 70 - 99 mg/dL 98   BUN 6 - 20 mg/dL 6   Creatinine 9.38 - 8.75 mg/dL 8.88   Sodium 864 - 854 mmol/L 137   Potassium 3.5 - 5.1 mmol/L 3.7   Chloride 98 - 111 mmol/L 102   CO2 22 - 32 mmol/L 25   Calcium 8.9 - 10.3 mg/dL 9.1     Imaging/Diagnostic Tests: None   Alena Morrison, Elio, MD 11/27/2023, 9:05 AM  PGY-1, [facility] FPTS Intern pager: [phone], text pages welcome Secure chat group [place] [facility] Teaching Service

## 2023-11-27 NOTE — Progress Notes (Addendum)
 Patient ID: Kyle Berry, male   DOB: 1971-03-17, 53 y.o.   MRN: [id number]    Progress Note   Subjective   Day # 8 CC; acute on chronic pancreatitis with persistent abdominal pain intermittent nausea vomiting  Patient comfortable this morning, says he tolerated clear liquids without difficulty no nausea or vomiting feels he can try to go up on his diet but does not feel ready for solid food  INR 1.0/pro time 13.7 Sodium 137/potassium 3.7/BUN 6/creatinine 1.1   Objective   Vital signs in last 24 hours: Temp:  [97.8 F (36.6 C)-98.3 F (36.8 C)] 98.2 F (36.8 C) (08/13 0335) Pulse Rate:  [56-74] 56 (08/13 0335) Resp:  [18] 18 (08/13 0335) BP: (119-153)/(78-94) 139/86 (08/13 0335) SpO2:  [98 %] 98 % (08/13 0335) Weight:  [76.2 kg] 76.2 kg (08/12 2354) Last BM Date : 11/26/23 General:    [name] in NAD Heart:  Regular rate and rhythm; no murmurs Lungs: Respirations even and unlabored, lungs CTA bilaterally Abdomen:  Soft, significant tenderness, no guarding or rebound ,nondistended. Normal bowel sounds. Extremities:  Without edema. Neurologic:  Alert and oriented,  grossly normal neurologically. Psych:  Cooperative. Normal mood and affect.  Intake/Output from previous day: No intake/output data recorded. Intake/Output this shift: No intake/output data recorded.  Lab Results: Recent Labs    11/25/23 0353  WBC 5.1  HGB 10.6*  HCT 31.6*  PLT 174   BMET Recent Labs    11/25/23 0353 11/26/23 0706 11/27/23 0515  NA 135 136 137  K 3.9 4.0 3.7  CL 106 101 102  CO2 21* 26 25  GLUCOSE 154* 112* 98  BUN 6 7 6   CREATININE 1.13 1.04 1.11  CALCIUM 8.7* 9.3 9.1   LFT Recent Labs    11/25/23 0353  PROT 5.7*  ALBUMIN 3.0*  AST 27  ALT 25  ALKPHOS 61  BILITOT 0.5   PT/INR Recent Labs    11/26/23 0706  LABPROT 13.7  INR 1.0      Assessment / Plan:    #43 53 year old male with chronic calcific pancreatitis admitted 9 days ago with  progressive pain over the previous 2 to 3 weeks and lower level pain over the past 6 months. CT imaging does show changes of mild acute on chronic pancreatitis no associated fluid collections.  Did get good results from prior celiac block done July 2024 and had no pain for about 6 months postprocedure then gradual worsening generally since then.  #2 postprandial vomiting-no evidence on CT of outlet obstruction or duodenal inflammatory changes, may have delayed emptying in setting of acute pancreatitis and narcotic use  Plan; advance to full liquid diet today, if does well without then low-fat diet tomorrow Restarting Creon  which he has been off of over the past several months.  Recommend 72,000 units with meals and 36,000 units with snacks once taking at least full liquids  Continue Zofran  4 mg every 6 hours around-the-clock Hoping to avoid postpyloric feedings  Will hold Lovenox  this evening, and keep n.p.o. past midnight in the event Dr. Wilhelmenia has cancellation on his schedule tomorrow and might be able to accommodate a celiac plexus block.  Patient understands that this is only if there is a cancellation on the schedule and otherwise we will arrange for outpatient celiac block over the next several weeks.    Principal Problem:   Acute on chronic pancreatitis [facility]) Active Problems:   History of alcohol abuse   Pancreatic insufficiency  Pancreatitis   Chronic health problem   Malnutrition of moderate degree     LOS: 9 days   Amy Esterwood PA-C 11/27/2023, 9:29 AM   Attending physician's note   I have taken history, reviewed the chart and examined the patient. I performed a substantive portion of this encounter, including complete performance of at least one of the key components, in conjunction with the APP. I agree with the Advanced Practitioner's note, impression and recommendations.   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 CT this adm without any new fluid collection or significant peripancreatic stranding.  Lipase Nl  Plan: -NPO past MN in case EUS celiac block can be done tomorrow at [facility]  If cannot be done tomorrow, will arrange as outpt -Hold Lovenox  this PM. -Continue full liquid diet.  Do not advance yet -D/W pt.    Anselm Bring, MD [facility] [phone]

## 2023-11-27 NOTE — Assessment & Plan Note (Addendum)
 Patient failed PO trial on 11/20/23 and again around 11/25/23. Tolerated clear liquid diet last night, but requiring frequent IV pain meds. - GI following - advance to full liquid diet today - still has PRN IV meds, tho we discussed avoiding those to plan for discharge - GI will set up OP celiac plexus block

## 2023-11-27 NOTE — Telephone Encounter (Signed)
 Patient Product/process development scientist completed.    The patient is insured through [employer]. Patient has [employer], may use a copay card, and/or apply for patient assistance if available.    Ran test claim for Creon  [id number] and the current 30 day co-pay is $0.00.   This test claim was processed through [facility]- copay amounts may vary at other pharmacies due to pharmacy/plan contracts, or as the patient moves through the different stages of their insurance plan.     Reyes Sharps, CPHT Pharmacy Technician III Certified Patient Advocate [facility] Patient Advocate Team Direct Number: [phone]  Fax: [phone]

## 2023-11-27 NOTE — Assessment & Plan Note (Signed)
 GERD:home famotidine   Hypertension: continue carvedilol , follow-up outpatient

## 2023-11-27 NOTE — Progress Notes (Signed)
 Nutrition Follow-up  DOCUMENTATION CODES:   Non-severe (moderate) malnutrition in context of chronic illness  INTERVENTION:   - Ensure Plus High Protein po TID, each supplement provides 350 kcal and 20 grams of protein  - MVI with minerals daily  - Recommend B-complex with vitamin C given chronic pancreatitis  - Given diet advanced to full liquids today, Team has decided against Cortrak placement and initiation of enteral nutrition  NUTRITION DIAGNOSIS:   Moderate Malnutrition related to chronic illness (chronic pancreatitis, gastroparesis, pancreatic insufficiency) as evidenced by moderate fat depletion, moderate muscle depletion.  Ongoing, being addressed via diet advancement and oral nutrition supplements  GOAL:   Patient will meet greater than or equal to 90% of their needs  Progressing  MONITOR:   Diet advancement, Supplement acceptance, PO intake, Labs, Weight trends, I & O's  REASON FOR ASSESSMENT:   NPO/Clear Liquid Diet    ASSESSMENT:   53 year old male who presented to the ED on 11/18/23 with bilateral flank pain, fever, N/V. PMH of chronic pancreatitis on Creon  and s/p Celiac plexus block in July 2024, HTN, GERD, HLD, gastroparesis, congenital single kidney, nephrolithiasis, EtOH abuse, s/p cholecystectomy, pancreatic insufficiency. Pt admitted with acute on chronic pancreatitis.  8/04 - NPO, clear liquids 8/05 - full liquids 8/06 - Regular diet 8/07 - clear liquids 8/08 - full liquids 8/09 - Regular diet 8/10 - NPO 8/12 - clear liquids 8/13 - full liquids  Noted diet advanced to clear liquids on 11/26/23 and then to full liquids this morning. Pt reports diet was initially advanced to [facility] this morning but that they backed it down to full liquids because he wasn't ready for solid food. Pt reports drinking Boost Breeze yesterday and this morning. He would like to transition to Ensure Plus High Protein now that he is on full liquids. Orders adjusted,  and pt provided with a chocolate Ensure at time of RD visit. RD to order daily MVI with minerals. Recommend B-complex with vitamin C as well given chronic pancreatitis.  Medications reviewed and include: vitamin B12 500 mcg daily, pepcid  10 mg BID, Boost Breeze TID, Creon  36,000 units TID before meals, IV zofran  4 mg every 8 hours  Labs reviewed.  Diet Order:   Diet Order             Diet NPO time specified Except for: Sips with Meds  Diet effective midnight           Diet full liquid Room service appropriate? Yes; Fluid consistency: Thin  Diet effective now                   EDUCATION NEEDS:   Education needs have been addressed  Skin:  Skin Assessment: Reviewed RN Assessment  Last BM:  11/26/23  Height:   Ht Readings from Last 1 Encounters:  11/26/23 6' 1 (1.854 m)    Weight:   Wt Readings from Last 1 Encounters:  11/26/23 76.2 kg    BMI:  Body mass index is 22.16 kg/m.  Estimated Nutritional Needs:   Kcal:  7749-7549  Protein:  100-120 grams  Fluid:  >2.2 L    Mallie Satchel, MS, RD, LDN Registered Dietitian II Please see AMiON for contact information.

## 2023-11-27 NOTE — Assessment & Plan Note (Addendum)
 RD following. Advancing diet today, so do not think he needs a cortrak. Will continue to evaluate

## 2023-11-27 NOTE — Consult Note (Signed)
 Chief Complaint: Patient was seen in consultation today for acute on chronic pancreatitis  Referring Physician(s): Dr. Lynnie Bring  Supervising Physician: Jennefer Rover  Patient Status: [facility] - In-pt  History of Present Illness: Kyle Berry is a 53 y.o. male with acute on chronic pancreatitis with persistent abdominal pain, poor appetite, nasuea/vomiting who has undergone successful endoscopic celiac plexus block with Dr. Wilhelmenia in the past.  GI following and was considering repeat procedure, however schedule precludes patient getting this done in the next several weeks.  IR consulted for percutaneous celiac plexus block.  Case reviewed by Dr. Jennefer who is agreeable to proceed.   Discussed with patient who is agreeable to proceed with block in Radiology and his hopeful for significant relief of his symptoms.    Past Medical History:  Diagnosis Date   Atrophy of right kidney    Chronic bronchitis (HCC)    Chronic lower back pain    Chronic pancreatitis (HCC)    Gastroparesis 06/2020   confirmed by emptying study at [facility]   GERD (gastroesophageal reflux disease)    Headache    once/month (05/13/2017)   Hematemesis 10/18/2017   Hematochezia 09/19/2021   High cholesterol    Hypertension    Migraine    a couple/year (05/13/2017)   Nephrolithiasis 05/13/2017   Pancreatitis    Pneumonia ~ 09/2015   Presence of pancreatic duct stent    Recurrent acute pancreatitis    Shortness of breath 05/22/2021    Past Surgical History:  Procedure Laterality Date   BIOPSY  11/02/2020   Procedure: BIOPSY;  Surgeon: Wilhelmenia Aloha Raddle., MD;  Location: [facility] ENDOSCOPY;  Service: Gastroenterology;;   BIOPSY  10/20/2022   Procedure: BIOPSY;  Surgeon: Wilhelmenia Aloha Raddle., MD;  Location: [facility] ENDOSCOPY;  Service: Gastroenterology;;   CHOLECYSTECTOMY N/A 10/23/2017   Procedure: LAPAROSCOPIC CHOLECYSTECTOMY WITH INTRAOPERATIVE CHOLANGIOGRAM;  Surgeon: Gladis Cough, MD;  Location:  [facility] ORS;  Service: General;  Laterality: N/A;   COLONOSCOPY WITH PROPOFOL  N/A 11/02/2019   Procedure: COLONOSCOPY WITH PROPOFOL ;  Surgeon: Abran Norleen SAILOR, MD;  Location: [facility] ENDOSCOPY;  Service: Endoscopy;  Laterality: N/A;   ERCP N/A 11/02/2020   Procedure: ENDOSCOPIC RETROGRADE CHOLANGIOPANCREATOGRAPHY (ERCP);  Surgeon: Wilhelmenia Aloha Raddle., MD;  Location: [facility] ENDOSCOPY;  Service: Gastroenterology;  Laterality: N/A;   ESOPHAGOGASTRODUODENOSCOPY N/A 11/02/2020   Procedure: ESOPHAGOGASTRODUODENOSCOPY (EGD);  Surgeon: Wilhelmenia Aloha Raddle., MD;  Location: [facility] ENDOSCOPY;  Service: Gastroenterology;  Laterality: N/A;   ESOPHAGOGASTRODUODENOSCOPY (EGD) WITH PROPOFOL  N/A 11/06/2019   Procedure: ESOPHAGOGASTRODUODENOSCOPY (EGD) WITH PROPOFOL ;  Surgeon: Abran Norleen SAILOR, MD;  Location: [facility] ENDOSCOPY;  Service: Endoscopy;  Laterality: N/A;   ESOPHAGOGASTRODUODENOSCOPY (EGD) WITH PROPOFOL  N/A 10/20/2022   Procedure: ESOPHAGOGASTRODUODENOSCOPY (EGD) WITH PROPOFOL ;  Surgeon: Wilhelmenia Aloha Raddle., MD;  Location: [facility] ENDOSCOPY;  Service: Gastroenterology;  Laterality: N/A;   HEMOSTASIS CLIP PLACEMENT  11/02/2019   Procedure: HEMOSTASIS CLIP PLACEMENT;  Surgeon: Abran Norleen SAILOR, MD;  Location: [facility] ENDOSCOPY;  Service: Endoscopy;;   NEUROLYTIC CELIAC PLEXUS  10/20/2022   Procedure: NEUROLYTIC CELIAC PLEXUS;  Surgeon: Wilhelmenia Aloha Raddle., MD;  Location: [facility] ENDOSCOPY;  Service: Gastroenterology;;   NO PAST SURGERIES     REMOVAL OF STONES  11/02/2020   Procedure: REMOVAL OF STONES;  Surgeon: Wilhelmenia Aloha Raddle., MD;  Location: [facility] ENDOSCOPY;  Service: Gastroenterology;;   CLEDA REMOVAL  11/02/2020   Procedure: STENT REMOVAL;  Surgeon: Wilhelmenia Aloha Raddle., MD;  Location: [facility] ENDOSCOPY;  Service: Gastroenterology;;   UPPER ESOPHAGEAL ENDOSCOPIC ULTRASOUND (EUS) N/A 10/20/2022  Procedure: UPPER ESOPHAGEAL ENDOSCOPIC ULTRASOUND (EUS);  Surgeon: Wilhelmenia Aloha Raddle., MD;  Location: [facility] ENDOSCOPY;  Service:  Gastroenterology;  Laterality: N/A;    Allergies: Norvasc  [amlodipine  besylate], Voltaren  [diclofenac ], Prilosec [omeprazole], Hydrochlorothiazide, Ultram  [tramadol ], and Prednisone   Medications: Prior to Admission medications   Medication Sig Start Date End Date Taking? Authorizing Provider  albuterol  (VENTOLIN  HFA) 108 (90 Base) MCG/ACT inhaler Inhale 2 puffs into the lungs every 6 (six) hours as needed for wheezing or shortness of breath.   Yes [provider]  carvedilol  (COREG ) 3.125 MG tablet Take 1 tablet (3.125 mg total) by mouth 2 (two) times daily with a meal. 10/23/22  Yes [name] Toribio GAILS, MD  cephALEXin  (KEFLEX ) 250 MG capsule Take 1 capsule (250 mg total) by mouth daily. 10/21/23  Yes Stoneking, Adine PARAS., MD  famotidine  (PEPCID ) 20 MG tablet Take 1 tablet (20 mg total) by mouth 2 (two) times daily for 30 days, THEN 1 tablet (20 mg total) daily. Patient taking differently: Take 1 tablet (20 mg total) daily as needed for indigestion  10/23/22 11/22/23 Yes [name] Toribio GAILS, MD  gabapentin  (NEURONTIN ) 300 MG capsule Take 300 mg by mouth 3 (three) times daily as needed (Pain).   Yes [provider]  naloxone  (NARCAN ) nasal spray 4 mg/0.1 mL Place 1 spray into the nose daily as needed (opioid reversal). 07/16/22  Yes [provider]  Naphazoline-Pheniramine (VISINE OP) Place 1 drop into both eyes daily.   Yes [provider]  Nutritional Supplements (ENSURE ORIGINAL) LIQD Take 1 Container by mouth 2 (two) times daily.   Yes [provider]  ondansetron  (ZOFRAN -ODT) 4 MG disintegrating tablet Take 1 tablet (4 mg total) by mouth every 8 (eight) hours as needed for nausea or vomiting. 09/09/23  Yes Horton, Charmaine FALCON, MD  promethazine  (PHENERGAN ) 25 MG tablet Take 1 tablet (25 mg total) by mouth every 6 (six) hours as needed for nausea or vomiting. 03/27/23  Yes Raenelle Coria, MD     Family History  Problem Relation Age of Onset   Hypertension  Other    Hypertension Mother    Hypertension Father    Kidney disease Father    Hypertension Sister    Diabetes Sister    Hypertension Brother    Pancreatic cancer Paternal Grandmother    Colon cancer Cousin    Stomach cancer Neg Hx    Esophageal cancer Neg Hx     Social History   Socioeconomic History   Marital status: Single    Spouse name: Not on file   Number of children: Not on file   Years of education: Not on file   Highest education level: Not on file  Occupational History   Occupation: Horticulturist, commercial  Tobacco Use   Smoking status: Never   Smokeless tobacco: Never  Vaping Use   Vaping status: Never Used  Substance and Sexual Activity   Alcohol use: Not Currently    Comment: remote h/o heavy use   Drug use: No   Sexual activity: Yes    Partners: Female    Birth control/protection: Condom  Other Topics Concern   Not on file  Social History Narrative   Not on file   Social Drivers of Health   Financial Resource Strain: Not at Risk (12/03/2022)   Received from [employer]    How hard is it for you to pay for the very basics like food, housing, heating, medical care, and medications?: 1  Food Insecurity:  No Food Insecurity (11/18/2023)   Hunger Vital Sign    Worried About Running Out of Food in the Last Year: Never true    Ran Out of Food in the Last Year: Never true  Transportation Needs: No Transportation Needs (11/18/2023)   PRAPARE - Administrator, Civil Service (Medical): No    Lack of Transportation (Non-Medical): No  Physical Activity: Not on File (08/03/2021)   [facility] from [facility]   Physical Activity    Physical Activity: 0  Stress: Not on File (08/03/2021)   Received from [facility]   Stress    Stress: 0  Social Connections: Not on File (12/24/2022)   Received from [facility]   Social Connections    Connectedness: 0     Review of Systems: A 12 point ROS discussed and pertinent positives are indicated in the HPI above.   All other systems are negative.  Review of Systems  Constitutional:  Negative for fatigue and fever.  Respiratory:  Negative for cough and shortness of breath.   Cardiovascular:  Negative for chest pain.  Gastrointestinal:  Positive for abdominal pain, nausea and vomiting.  Musculoskeletal:  Negative for back pain.  Psychiatric/Behavioral:  Negative for behavioral problems and confusion.     Vital Signs: BP 135/84 (BP Location: Right Arm)   Pulse 61   Temp 98.6 F (37 C) (Oral)   Resp 18   Ht 6' 1 (1.854 m)   Wt 167 lb 15.9 oz (76.2 kg)   SpO2 99%   BMI 22.16 kg/m   Physical Exam Vitals and nursing note reviewed.  Constitutional:      General: He is not in acute distress.    Appearance: He is well-developed. He is not ill-appearing.  Cardiovascular:     Rate and Rhythm: Normal rate and regular rhythm.  Pulmonary:     Effort: Pulmonary effort is normal. No respiratory distress.     Breath sounds: Normal breath sounds.  Abdominal:     General: Abdomen is flat. There is no distension.     Tenderness: There is abdominal tenderness.  Skin:    General: Skin is warm and dry.  Neurological:     General: No focal deficit present.     Mental Status: He is alert and oriented to person, place, and time.  Psychiatric:        Mood and Affect: Mood normal.        Behavior: Behavior normal.      MD Evaluation Airway: WNL Heart: WNL Abdomen: WNL Chest/ Lungs: WNL ASA  Classification: 3 Mallampati/Airway Score: Two   Imaging: CT ABDOMEN PELVIS W CONTRAST Result Date: 11/22/2023 CLINICAL DATA:  Follow-up pancreatitis EXAM: CT ABDOMEN AND PELVIS WITH CONTRAST TECHNIQUE: Multidetector CT imaging of the abdomen and pelvis was performed using the standard protocol following bolus administration of intravenous contrast. RADIATION DOSE REDUCTION: This exam was performed according to the departmental dose-optimization program which includes automated exposure control, adjustment of  the mA and/or kV according to patient size and/or use of iterative reconstruction technique. CONTRAST:  75mL OMNIPAQUE  IOHEXOL  350 MG/ML SOLN COMPARISON:  CT 11/18/2023, 09/08/2023, 08/11/2023 FINDINGS: Lower chest: Lung bases demonstrate atelectasis at the bases. Coronary vascular calcification. Hepatobiliary: Cholecystectomy. Stable prominence of the common bile duct likely due to surgical change Pancreas: Minimal peripancreatic stranding without worsening compared to the prior exam. No organized fluid collections. Spleen: Normal in size without focal abnormality. Adrenals/Urinary Tract: Adrenal glands are normal. Severe atrophy right kidney with cysts, no specific imaging  follow-up recommended. Bladder is normal small nonobstructing left kidney stone Stomach/Bowel: The stomach is nonenlarged. A few fluid-filled loops of borderline distended small bowel in the anterior abdomen without transition. No acute bowel wall thickening. Negative appendix Vascular/Lymphatic: No significant vascular findings are present. No enlarged abdominal or pelvic lymph nodes. Reproductive: Prostate is unremarkable. Other: Negative for pelvic effusion or free air. Musculoskeletal: No acute or suspicious osseous abnormality. IMPRESSION: 1. Minimal peripancreatic stranding without worsening compared to the prior exam. No organized fluid collections. 2. A few fluid-filled loops of borderline distended nonspecific small bowel in the anterior abdomen. No obstruction. 3. Severe atrophy of the right kidney. Small nonobstructing left kidney stone. Electronically Signed   By: Luke Bun M.D.   On: 11/22/2023 00:48   CT ABDOMEN PELVIS W CONTRAST Result Date: 11/18/2023 CLINICAL DATA:  Abdominal pain. EXAM: CT ABDOMEN AND PELVIS WITH CONTRAST TECHNIQUE: Multidetector CT imaging of the abdomen and pelvis was performed using the standard protocol following bolus administration of intravenous contrast. RADIATION DOSE REDUCTION: This exam was  performed according to the departmental dose-optimization program which includes automated exposure control, adjustment of the mA and/or kV according to patient size and/or use of iterative reconstruction technique. CONTRAST:  60mL OMNIPAQUE  IOHEXOL  350 MG/ML SOLN COMPARISON:  CT abdomen pelvis dated 09/08/2023. FINDINGS: Lower chest: The visualized lung bases are clear. No intra-abdominal free air or free fluid. Hepatobiliary: The liver is unremarkable. There is mild biliary dilatation, post cholecystectomy. Pancreas: Sequela of chronic pancreatitis with small scattered pancreatic calcifications and mild gland atrophy. There is inflammatory changes of the pancreas consistent with acute on chronic pancreatitis. No abscess or pseudocyst. Spleen: Normal in size without focal abnormality. Adrenals/Urinary Tract: The adrenal glands are unremarkable. Atrophic right kidney. There is a 5 mm nonobstructing left renal inferior pole calculus. No hydronephrosis. The visualized ureters appear unremarkable. The urinary bladder is minimally distended and grossly unremarkable. Stomach/Bowel: There is no bowel obstruction or active inflammation. The appendix is normal. Vascular/Lymphatic: The abdominal aorta and IVC unremarkable. No portal venous gas. There is no adenopathy. Reproductive: The prostate and seminal vesicles are grossly unremarkable. No pelvic mass. Other: None Musculoskeletal: Degenerative changes at L5-S1. No acute osseous pathology. IMPRESSION: 1. Acute on chronic pancreatitis. No abscess or pseudocyst. 2. Atrophic right kidney. 3. A 5 mm nonobstructing left renal inferior pole calculus. No hydronephrosis. 4. No bowel obstruction. Normal appendix. Electronically Signed   By: Vanetta Chou M.D.   On: 11/18/2023 12:53    Labs:  CBC: Recent Labs    11/19/23 0629 11/20/23 0543 11/24/23 0507 11/25/23 0353  WBC 4.4 5.3 4.4 5.1  HGB 11.3* 12.5* 11.0* 10.6*  HCT 32.7* 37.5* 32.1* 31.6*  PLT 162 151 174  174    COAGS: Recent Labs    11/26/23 0706  INR 1.0    BMP: Recent Labs    11/24/23 0507 11/25/23 0353 11/26/23 0706 11/27/23 0515  NA 138 135 136 137  K 3.9 3.9 4.0 3.7  CL 104 106 101 102  CO2 26 21* 26 25  GLUCOSE 118* 154* 112* 98  BUN 8 6 7 6   CALCIUM 9.1 8.7* 9.3 9.1  CREATININE 1.26* 1.13 1.04 1.11  GFRNONAA >60 >60 >60 >60    LIVER FUNCTION TESTS: Recent Labs    11/21/23 0632 11/22/23 0503 11/24/23 0507 11/25/23 0353  BILITOT 0.5 0.3 0.5 0.5  AST 30 38 35 27  ALT 21 24 29 25   ALKPHOS 75 63 64 61  PROT 5.9* 6.3* 5.8* 5.7*  ALBUMIN 3.0* 3.2* 3.0* 3.0*    TUMOR MARKERS: No results for input(s): AFPTM, CEA, CA199, CHROMGRNA in the last 8760 hours.  Assessment and Plan: Acute on chronic pancreatitis with intractable pain, poor appetite, nausea and vomiting Patient admitted for pain and symptom management of his pancreatitis. Mild acute changes by CT imaging on admission.  He has undergone successful neurolysis in 09/2022 and team is requesting repeat procedure. This was previously done endoscopically, however GI availability limited at this time.  IR consulted for same.  Case reviewed and approved by Dr. Jennefer.  Patient aware of plans for radiology procedure and is agreeable.  He will be NPO p MN. Lovenox  held (patient states he has been refusing this during hematuria in the past).   Risks and benefits of neurolysis was discussed with the patient and/or patient's family including, but not limited to bleeding, infection, damage to adjacent structures or variable results.  All of the questions were answered and there is agreement to proceed.  Consent signed and in chart.   Thank you for this interesting consult.  I greatly enjoyed meeting Kyle Berry and look forward to participating in their care.  A copy of this report was sent to the requesting provider on this date.  Electronically Signed: Telesforo Brosnahan Sue-Ellen Naoki Migliaccio, PA 11/27/2023,  5:03 PM   I spent a total of 20 Minutes    in face to face in clinical consultation, greater than 50% of which was counseling/coordinating care for intractable abdominal pain, aute on chronic pancreatitis.

## 2023-11-27 NOTE — Assessment & Plan Note (Addendum)
-   GI recommending his home dose of creon  with PO - Plan for 76k with meals and 36k with snacks

## 2023-11-28 ENCOUNTER — Inpatient Hospital Stay (HOSPITAL_COMMUNITY)

## 2023-11-28 DIAGNOSIS — K861 Other chronic pancreatitis: Secondary | ICD-10-CM | POA: Diagnosis not present

## 2023-11-28 DIAGNOSIS — K8681 Exocrine pancreatic insufficiency: Secondary | ICD-10-CM | POA: Diagnosis not present

## 2023-11-28 DIAGNOSIS — K859 Acute pancreatitis without necrosis or infection, unspecified: Secondary | ICD-10-CM | POA: Diagnosis not present

## 2023-11-28 MED ORDER — MIDAZOLAM HCL 2 MG/2ML IJ SOLN
INTRAMUSCULAR | Status: AC
  Filled 2023-11-28: qty 2

## 2023-11-28 MED ORDER — BUPIVACAINE HCL (PF) 0.5 % IJ SOLN
10.0000 mL | Freq: Once | INTRAMUSCULAR | Status: AC
  Administered 2023-11-28: 10 mL
  Filled 2023-11-28: qty 10

## 2023-11-28 MED ORDER — METHYLPREDNISOLONE ACETATE 80 MG/ML IJ SUSP
INTRAMUSCULAR | Status: AC
  Filled 2023-11-28: qty 1

## 2023-11-28 MED ORDER — MIDAZOLAM HCL 2 MG/2ML IJ SOLN
INTRAMUSCULAR | Status: AC | PRN
Start: 2023-11-28 — End: 2023-11-28
  Administered 2023-11-28: 1 mg via INTRAVENOUS

## 2023-11-28 MED ORDER — METHYLPREDNISOLONE SODIUM SUCC 40 MG IJ SOLR
40.0000 mg | Freq: Once | INTRAMUSCULAR | Status: AC
  Administered 2023-11-28: 40 mg via INTRAVENOUS

## 2023-11-28 MED ORDER — FENTANYL CITRATE (PF) 100 MCG/2ML IJ SOLN
INTRAMUSCULAR | Status: AC | PRN
  Administered 2023-11-28: 50 ug via INTRAVENOUS

## 2023-11-28 MED ORDER — LIDOCAINE HCL 1 % IJ SOLN
10.0000 mL | Freq: Once | INTRAMUSCULAR | Status: AC
  Administered 2023-11-28: 10 mL via INTRADERMAL

## 2023-11-28 MED ORDER — FENTANYL CITRATE (PF) 100 MCG/2ML IJ SOLN
INTRAMUSCULAR | Status: AC
  Filled 2023-11-28: qty 2

## 2023-11-28 MED ORDER — IOHEXOL 350 MG/ML SOLN
10.0000 mL | Freq: Once | INTRAVENOUS | Status: AC | PRN
  Administered 2023-11-28: 10 mL

## 2023-11-28 MED ORDER — METHYLPREDNISOLONE ACETATE 40 MG/ML IJ SUSP
INTRAMUSCULAR | Status: AC
  Filled 2023-11-28: qty 1

## 2023-11-28 NOTE — Procedures (Signed)
 Interventional Radiology Procedure Note  Procedure: CT guided celiac plexus block  Findings: Please refer to procedural dictation for full description. 80 mg depomedrol + 3 mL 0.5% bupivicaine.  Complications: None immediate  Estimated Blood Loss: < 5 mL  Recommendations: IR will follow and monitor for response and consideration of neurolysis in the future if successful.   Ester Sides, MD

## 2023-11-28 NOTE — Plan of Care (Signed)

## 2023-11-28 NOTE — Progress Notes (Addendum)
 Patient ID: Kyle Berry, male   DOB: November 09, 1970, 53 y.o.   MRN: [id number]    Progress Note   Subjective  Day #10 CC; acute on chronic calcific pancreatitis with persistent abdominal pain intermittent nausea and vomiting  Patient was able to undergo celiac block this morning-he says his abdominal pain is still present, he is not having any pain in his back or at the injection site would like to try solid food.  He did well with full liquids yesterday and did not have any vomiting     Objective   Vital signs in last 24 hours: Temp:  [97.9 F (36.6 C)-98.9 F (37.2 C)] 97.9 F (36.6 C) (08/14 0627) Pulse Rate:  [55-70] 62 (08/14 0900) Resp:  [10-18] 14 (08/14 0900) BP: (135-178)/(84-95) 142/90 (08/14 0900) SpO2:  [98 %-100 %] 99 % (08/14 0900) Last BM Date : 11/27/23 General:    [name] in NAD resting in bed Heart:  Regular rate and rhythm; no murmurs Lungs: Respirations even and unlabored, lungs CTA bilaterally Abdomen:  Soft, no significant abdominal tenderness nondistended. Normal bowel sounds. Extremities:  Without edema. Neurologic:  Alert and oriented,  grossly normal neurologically. Psych:  Cooperative. Normal mood and affect.  Intake/Output from previous day: 08/13 0701 - 08/14 0700 In: 720 [P.O.:720] Out: -  Intake/Output this shift: No intake/output data recorded.  Lab Results: No results for input(s): WBC, HGB, HCT, PLT in the last 72 hours. BMET Recent Labs    11/26/23 0706 11/27/23 0515  NA 136 137  K 4.0 3.7  CL 101 102  CO2 26 25  GLUCOSE 112* 98  BUN 7 6  CREATININE 1.04 1.11  CALCIUM 9.3 9.1   LFT No results for input(s): PROT, ALBUMIN, AST, ALT, ALKPHOS, BILITOT, BILIDIR, IBILI in the last 72 hours. PT/INR Recent Labs    11/26/23 0706  LABPROT 13.7  INR 1.0       Assessment / Plan:    #23 53 year old African-American male with acute on chronic calcific pancreatitis who was admitted 10  days ago with progressive abdominal pain over the previous 2 to 3 weeks, and on further conversation had been having lower level pain over the past 6 months.  Imaging showed changes of mild acute on chronic pancreatitis no associated fluid collections.  After initially being able to advance his diet he then developed some postprandial vomiting, there is no evidence of outlet obstruction or duodenal inflammatory changes on repeat CT.  Felt he may have some delayed emptying in the setting of narcotic use and acute pancreatitis.  Patient was able to undergo celiac plexus block by IR today and tolerated procedure well  Plan; Will advance to soft low-fat diet He does well with diet without vomiting, he should be able to be discharged home tomorrow Please discharge on Creon  72,000 units with meals and 36,000 units with snacks He will also need Rx for Zofran  4 mg every 6 hours as needed for nausea He has office appointment at our office on 12/27/2023 and is encouraged to keep that appointment.  Hopefully he will have good response from the celiac plexus block.  We discussed that this could be repeated in several months per IR if needed, and Dr. Jennefer suggested that neurolysis could also be done.  Encouraged patient to continue outpatient follow-up so we can try to manage his symptoms proactively and avoid hospitalization.    Principal Problem:   Acute on chronic pancreatitis [facility]) Active Problems:   History of  alcohol abuse   Pancreatic insufficiency   Pancreatitis   Chronic health problem   Malnutrition of moderate degree     LOS: 10 days   Amy Esterwood PA-C 11/28/2023, 12:55 PM    Attending physician's note   I have taken history, reviewed the chart and examined the patient. I performed a substantive portion of this encounter, including complete performance of at least one of the key components, in conjunction with the APP. I agree with the Advanced Practitioner's note, impression and  recommendations.   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 CT this adm without any new fluid collection or significant peripancreatic stranding.   Appreciate IR performing celiac plexus block.  Patient doing well.  Advance diet Resume Creon  and other home medications as above Follow-up GI clinic as above Will sign off for now  Anselm Bring, MD [facility] [phone]

## 2023-11-28 NOTE — Progress Notes (Signed)
     Daily Progress Note Intern Pager: [phone]  Patient name: MARCHELLO ROTHGEB Medical record number: [id number] Date of birth: 1971/02/09 Age: 53 y.o. Gender: male  Primary Care Provider: [facility] Consultants: GI, IR Code Status: Full  Pt Overview and Major Events to Date:  8/4 - admitted 8/7 - failed PO 8/10 - failed PO 8/14 - celiac plexus block with IR  Assessment and Plan:  53 yo male with acute on chronic pancreatitis. Hopeful for IR to take him for celiac plexus block today.  Assessment & Plan Acute on chronic pancreatitis (HCC) GI and IR following. Made him NPO, holding lovenox . He did get the celiac plexus block today.  - EKG since receiving Zofran  from GI and borderline prolonged qtc - every other day BMP as it has been stable Chronic health problem GERD: home famotidine   Hypertension: continue carvedilol , follow-up outpatient Malnutrition: RD following, may need cortrak if unable to PO Pancreatic insufficiency: When able to PO, Plan for 76k with meals and 36k with snacks  FEN/GI: NPO PPx: held lovenox  for procedure Dispo:Home pending clinical improvement .   Subjective:  He is doing well following the procedure. No complaints.  Objective: Temp:  [97.9 F (36.6 C)-99.3 F (37.4 C)] 97.9 F (36.6 C) (08/14 0627) Pulse Rate:  [55-72] 60 (08/14 0745) Resp:  [15-19] 15 (08/14 0745) BP: (135-165)/(84-95) 165/95 (08/14 0745) SpO2:  [98 %-99 %] 98 % (08/14 0745) Physical Exam: General: Resting in bed Cardiovascular: RRR Respiratory: no increased WOB Abdomen: soft, nondistended Extremities: no peripheral edmea  Laboratory: Most recent CBC Lab Results  Component Value Date   WBC 5.1 11/25/2023   HGB 10.6 (L) 11/25/2023   HCT 31.6 (L) 11/25/2023   MCV 102.6 (H) 11/25/2023   PLT 174 11/25/2023   Most recent BMP    Latest Ref Rng & Units 11/27/2023    5:15 AM  BMP  Glucose 70 - 99 mg/dL 98   BUN 6 - 20 mg/dL 6    Creatinine 9.38 - 1.24 mg/dL 8.88   Sodium 864 - 854 mmol/L 137   Potassium 3.5 - 5.1 mmol/L 3.7   Chloride 98 - 111 mmol/L 102   CO2 22 - 32 mmol/L 25   Calcium 8.9 - 10.3 mg/dL 9.1    Imaging/Diagnostic Tests: None  Alena Morrison, [place], MD 11/28/2023, 8:02 AM  PGY-1, [facility] FPTS Intern pager: [phone], text pages welcome Secure chat group [facility] Teaching Service

## 2023-11-28 NOTE — Assessment & Plan Note (Signed)
 GERD: home famotidine   Hypertension: continue carvedilol , follow-up outpatient Malnutrition: RD following, may need cortrak if unable to PO Pancreatic insufficiency: When able to PO, Plan for 76k with meals and 36k with snacks

## 2023-11-28 NOTE — Assessment & Plan Note (Addendum)
 GI and IR following. Made him NPO, holding lovenox . He did get the celiac plexus block today.  - EKG since receiving Zofran  from GI and borderline prolonged qtc - every other day BMP as it has been stable

## 2023-11-29 ENCOUNTER — Other Ambulatory Visit (HOSPITAL_COMMUNITY): Payer: Self-pay

## 2023-11-29 DIAGNOSIS — K859 Acute pancreatitis without necrosis or infection, unspecified: Secondary | ICD-10-CM | POA: Diagnosis not present

## 2023-11-29 DIAGNOSIS — K861 Other chronic pancreatitis: Secondary | ICD-10-CM | POA: Diagnosis not present

## 2023-11-29 LAB — BASIC METABOLIC PANEL WITH GFR
Anion gap: 11 (ref 5–15)
BUN: 12 mg/dL (ref 6–20)
CO2: 21 mmol/L — ABNORMAL LOW (ref 22–32)
Calcium: 9.4 mg/dL (ref 8.9–10.3)
Chloride: 103 mmol/L (ref 98–111)
Creatinine, Ser: 1.44 mg/dL — ABNORMAL HIGH (ref 0.61–1.24)
GFR, Estimated: 58 mL/min — ABNORMAL LOW (ref >60)
Glucose, Bld: 164 mg/dL — ABNORMAL HIGH (ref 70–99)
Potassium: 4.4 mmol/L (ref 3.5–5.1)
Sodium: 135 mmol/L (ref 135–145)

## 2023-11-29 MED ORDER — PANCRELIPASE (LIP-PROT-AMYL) 36000-114000 UNITS PO CPEP
ORAL_CAPSULE | ORAL | 0 refills | Status: DC
  Filled 2023-11-29: qty 100, 12d supply, fill #0

## 2023-11-29 MED ORDER — PANCRELIPASE (LIP-PROT-AMYL) 36000-114000 UNITS PO CPEP: ORAL_CAPSULE | ORAL | 2 refills | Status: DC

## 2023-11-29 MED ORDER — HYDROMORPHONE HCL 2 MG PO TABS
1.0000 mg | ORAL_TABLET | ORAL | Status: DC | PRN
  Administered 2023-11-29 – 2023-11-30 (×3): 1 mg via ORAL
  Filled 2023-11-29 (×3): qty 1

## 2023-11-29 MED ORDER — ENOXAPARIN SODIUM 40 MG/0.4ML IJ SOSY
40.0000 mg | PREFILLED_SYRINGE | INTRAMUSCULAR | Status: DC
  Filled 2023-11-29 (×2): qty 0.4

## 2023-11-29 MED ORDER — HYDROMORPHONE HCL 2 MG PO TABS
1.0000 mg | ORAL_TABLET | ORAL | 0 refills | Status: AC | PRN
  Filled 2023-11-29: qty 9, 3d supply, fill #0

## 2023-11-29 MED ORDER — PROMETHAZINE HCL 25 MG PO TABS
25.0000 mg | ORAL_TABLET | Freq: Four times a day (QID) | ORAL | 0 refills | Status: AC | PRN
  Filled 2023-11-29: qty 20, 5d supply, fill #0

## 2023-11-29 MED ORDER — PROMETHAZINE HCL 25 MG PO TABS
25.0000 mg | ORAL_TABLET | Freq: Four times a day (QID) | ORAL | Status: DC | PRN
  Administered 2023-11-29: 25 mg via ORAL
  Filled 2023-11-29: qty 1

## 2023-11-29 NOTE — Assessment & Plan Note (Addendum)
 Patient gradually improving. GI signed off.  - Phenergan  12.5 q4h prn - soft low-fat diet today.  - appt with GI 12/27/23

## 2023-11-29 NOTE — Assessment & Plan Note (Signed)
 Patient gradually improving. GI signed off.  - change zofran  to phenergan  12.5 q4h prn - d/c IV pain meds - soft low-fat diet today.  - potentially stable for late discharge today if PO goes well - will transition back to phenergan  as he has better symptom control than with zofran  - appt with GI 12/27/23

## 2023-11-29 NOTE — Progress Notes (Addendum)
     Daily Progress Note Intern Pager: [phone]  Patient name: Kyle Berry Medical record number: [id number] Date of birth: Dec 24, 1970 Age: 53 y.o. Gender: male  Primary Care Provider: [facility] Consultants: GI (s/o), IR (s/o) Code Status: Full  Pt Overview and Major Events to Date:  8/4 - admitted 8/7 - failed PO 8/10 - failed PO 8/14 - celiac plexus block with IR  Assessment and Plan:  Kyle Berry is a 53 y.o. male admitted for acute on chronic pancreatitis. Multiple failed PO challenges now s/p celiac block.   Feels ready to discharge home today. Did not require IV pain medication overnight. Still reporting some discomfort with PO but improving.  Assessment & Plan Acute on chronic pancreatitis [facility]) Patient gradually improving. GI signed off.  - Phenergan  12.5 q4h prn - soft low-fat diet today.  - appt with GI 12/27/23 Chronic health problem GERD: home famotidine   Hypertension: continue carvedilol , follow-up outpatient Malnutrition: RD following, may need cortrak if unable to PO Pancreatic insufficiency: When able to PO, Plan for 76k with meals and 36k with snacks  FEN/GI: Soft  PPx: lovenox  Dispo:Home today. Barriers include pain control.   Subjective:  Resting comfortably. Mild discomfort with eating but tolerable. Feels ready to go home.   Objective: Temp:  [97.7 F (36.5 C)-98.4 F (36.9 C)] 98.4 F (36.9 C) (08/15 2059) Pulse Rate:  [60-72] 68 (08/15 2059) Resp:  [16-17] 16 (08/15 0725) BP: (132-152)/(87-99) 152/96 (08/15 2059) SpO2:  [95 %-100 %] 100 % (08/15 2059) Physical Exam: General: Chronically ill-appearing, no acute distress Cardio: RRR, no murmur on exam Pulm: clear, No increased work of breathing Abdomen: Soft, bowel sounds present, nontender Extremity: No peripheral edema   Laboratory: Most recent CBC Lab Results  Component Value Date   WBC 5.1 11/25/2023   HGB 10.6 (L) 11/25/2023   HCT 31.6  (L) 11/25/2023   MCV 102.6 (H) 11/25/2023   PLT 174 11/25/2023   Most recent BMP    Latest Ref Rng & Units 11/29/2023    4:59 AM  BMP  Glucose 70 - 99 mg/dL 835   BUN 6 - 20 mg/dL 12   Creatinine 9.38 - 1.24 mg/dL 8.55   Sodium 864 - 854 mmol/L 135   Potassium 3.5 - 5.1 mmol/L 4.4   Chloride 98 - 111 mmol/L 103   CO2 22 - 32 mmol/L 21   Calcium 8.9 - 10.3 mg/dL 9.4     Imaging/Diagnostic Tests: No new imaging   Cleotilde Perkins, DO 11/29/2023, 9:46 PM  PGY-3, [facility] [place] [facility] FPTS Intern pager: [phone], text pages welcome Secure chat group [facility] Teaching Service

## 2023-11-29 NOTE — Plan of Care (Signed)

## 2023-11-29 NOTE — Progress Notes (Signed)
     Daily Progress Note Intern Pager: [phone]  Patient name: Kyle Berry Medical record number: [id number] Date of birth: [birth year] Age: 53 y.o. Gender: male  Primary Care Provider: [facility] Consultants: GI (s/o), IR Code Status: full  Pt Overview and Major Events to Date:  8/4 - admitted 8/7 - failed PO 8/10 - failed PO 8/14 - celiac plexus block with IR  Assessment and Plan:  53 yo male with acute on chronic pancreatitis, now s/p celiac plexus block with IR, who is improving. Advancing diet today.   Assessment & Plan Acute on chronic pancreatitis [facility]) Patient gradually improving. GI signed off.  - change zofran  to phenergan  12.5 q4h prn - d/c IV pain meds - soft low-fat diet today.  - potentially stable for late discharge today if PO goes well - will transition back to phenergan  as he has better symptom control than with zofran  - appt with GI 12/27/23 Chronic health problem GERD: home famotidine   Hypertension: continue carvedilol , follow-up outpatient Malnutrition: RD following, may need cortrak if unable to PO Pancreatic insufficiency: When able to PO, Plan for 76k with meals and 36k with snacks  FEN/GI: GI soft PPx: add lovenox  Dispo:Home pending clinical improvement .   Subjective:  8/10 pain right now, but he feels like this is resolving. Nausea less controlled with zofran  than phenergan .   Objective: Temp:  [97.7 F (36.5 C)-99 F (37.2 C)] 98.1 F (36.7 C) (08/15 0725) Pulse Rate:  [60-70] 60 (08/15 0725) Resp:  [14-18] 16 (08/15 0725) BP: (132-157)/(86-91) 137/87 (08/15 0725) SpO2:  [95 %-99 %] 95 % (08/15 0725)  Physical Exam: General: resting comfortably in bed Cardiovascular: RRR Respiratory: CTAB Abdomen: soft, no grimacing or signs of pain with epigastric palpation, no guarding Extremities: no edema  Laboratory: Most recent CBC Lab Results  Component Value Date   WBC 5.1 11/25/2023   HGB 10.6 (L)  11/25/2023   HCT 31.6 (L) 11/25/2023   MCV 102.6 (H) 11/25/2023   PLT 174 11/25/2023   Most recent BMP    Latest Ref Rng & Units 11/29/2023    4:59 AM  BMP  Glucose 70 - 99 mg/dL 835   BUN 6 - 20 mg/dL 12   Creatinine 9.38 - 1.24 mg/dL 8.55   Sodium 864 - 854 mmol/L 135   Potassium 3.5 - 5.1 mmol/L 4.4   Chloride 98 - 111 mmol/L 103   CO2 22 - 32 mmol/L 21   Calcium 8.9 - 10.3 mg/dL 9.4    Imaging/Diagnostic Tests: None  Alena Morrison, Elio, MD 11/29/2023, 8:59 AM  PGY-1, [place] [facility] FPTS Intern pager: [phone], text pages welcome Secure chat group [facility] Teaching Service

## 2023-11-29 NOTE — Assessment & Plan Note (Signed)
 GERD: home famotidine   Hypertension: continue carvedilol , follow-up outpatient Malnutrition: RD following, may need cortrak if unable to PO Pancreatic insufficiency: When able to PO, Plan for 76k with meals and 36k with snacks

## 2023-11-30 DIAGNOSIS — K861 Other chronic pancreatitis: Secondary | ICD-10-CM | POA: Diagnosis not present

## 2023-11-30 DIAGNOSIS — K859 Acute pancreatitis without necrosis or infection, unspecified: Secondary | ICD-10-CM | POA: Diagnosis not present

## 2023-11-30 NOTE — Plan of Care (Signed)
  Problem: Education: Goal: Knowledge of General Education information will improve Description: Including pain rating scale, medication(s)/side effects and non-pharmacologic comfort measures Outcome: Adequate for Discharge   Problem: Health Behavior/Discharge Planning: Goal: Ability to manage health-related needs will improve Outcome: Adequate for Discharge   Problem: Clinical Measurements: Goal: Ability to maintain clinical measurements within normal limits will improve Outcome: Adequate for Discharge Goal: Will remain free from infection Outcome: Adequate for Discharge Goal: Diagnostic test results will improve Outcome: Adequate for Discharge Goal: Respiratory complications will improve Outcome: Adequate for Discharge Goal: Cardiovascular complication will be avoided Outcome: Adequate for Discharge   Problem: Activity: Goal: Risk for activity intolerance will decrease Outcome: Adequate for Discharge   Problem: Nutrition: Goal: Adequate nutrition will be maintained Outcome: Adequate for Discharge   Problem: Coping: Goal: Level of anxiety will decrease Outcome: Adequate for Discharge   Problem: Elimination: Goal: Will not experience complications related to bowel motility Outcome: Adequate for Discharge Goal: Will not experience complications related to urinary retention Outcome: Adequate for Discharge   Problem: Pain Managment: Goal: General experience of comfort will improve and/or be controlled Outcome: Adequate for Discharge   Problem: Safety: Goal: Ability to remain free from injury will improve Outcome: Adequate for Discharge   Problem: Skin Integrity: Goal: Risk for impaired skin integrity will decrease Outcome: Adequate for Discharge   Problem: Malnutrition  (NI-5.2) Goal: Food and/or nutrient delivery Description: Individualized approach for food/nutrient provision. Outcome: Adequate for Discharge

## 2023-11-30 NOTE — Progress Notes (Signed)
 Pt ambulated off unit to front entrance with staff and sister.  Discharged home in stable condition with all belongings.

## 2023-11-30 NOTE — Progress Notes (Signed)
 Discharge instructions completed with pt.  Copy of instructions given.  20g IV removed from left forearm with tip intact.  Waiting on his sister to come take him home.

## 2023-11-30 NOTE — Plan of Care (Signed)

## 2023-12-02 ENCOUNTER — Encounter (HOSPITAL_COMMUNITY): Payer: Self-pay

## 2023-12-02 ENCOUNTER — Other Ambulatory Visit: Payer: Self-pay

## 2023-12-02 ENCOUNTER — Emergency Department (HOSPITAL_COMMUNITY)
Admission: EM | Admit: 2023-12-02 | Discharge: 2023-12-02 | Disposition: A | Discharge status: Home / Self Care | Attending: Emergency Medicine | Admitting: Emergency Medicine

## 2023-12-02 DIAGNOSIS — R109 Unspecified abdominal pain: Secondary | ICD-10-CM | POA: Diagnosis present

## 2023-12-02 DIAGNOSIS — I10 Essential (primary) hypertension: Secondary | ICD-10-CM | POA: Diagnosis not present

## 2023-12-02 DIAGNOSIS — F1010 Alcohol abuse, uncomplicated: Secondary | ICD-10-CM | POA: Diagnosis not present

## 2023-12-02 DIAGNOSIS — E860 Dehydration: Secondary | ICD-10-CM | POA: Insufficient documentation

## 2023-12-02 DIAGNOSIS — K861 Other chronic pancreatitis: Secondary | ICD-10-CM | POA: Diagnosis not present

## 2023-12-02 LAB — COMPREHENSIVE METABOLIC PANEL WITH GFR
ALT: 58 U/L — ABNORMAL HIGH (ref 0–44)
AST: 60 U/L — ABNORMAL HIGH (ref 15–41)
Albumin: 3.7 g/dL (ref 3.5–5.0)
Alkaline Phosphatase: 51 U/L (ref 38–126)
Anion gap: 16 — ABNORMAL HIGH (ref 5–15)
BUN: 13 mg/dL (ref 6–20)
CO2: 19 mmol/L — ABNORMAL LOW (ref 22–32)
Calcium: 8.5 mg/dL — ABNORMAL LOW (ref 8.9–10.3)
Chloride: 103 mmol/L (ref 98–111)
Creatinine, Ser: 1.11 mg/dL (ref 0.61–1.24)
GFR, Estimated: >60 mL/min (ref >60)
Glucose, Bld: 106 mg/dL — ABNORMAL HIGH (ref 70–99)
Potassium: 4.6 mmol/L (ref 3.5–5.1)
Sodium: 138 mmol/L (ref 135–145)
Total Bilirubin: 0.7 mg/dL (ref 0.0–1.2)
Total Protein: 7.3 g/dL (ref 6.5–8.1)

## 2023-12-02 LAB — URINALYSIS, ROUTINE W REFLEX MICROSCOPIC
Bilirubin Urine: NEGATIVE
Glucose, UA: NEGATIVE mg/dL
Ketones, ur: NEGATIVE mg/dL
Leukocytes,Ua: NEGATIVE
Nitrite: NEGATIVE
Protein, ur: NEGATIVE mg/dL
Specific Gravity, Urine: 1.017 (ref 1.005–1.030)
pH: 5 (ref 5.0–8.0)

## 2023-12-02 LAB — CBC
HCT: 37.7 % — ABNORMAL LOW (ref 39.0–52.0)
Hemoglobin: 12.8 g/dL — ABNORMAL LOW (ref 13.0–17.0)
MCH: 34 pg (ref 26.0–34.0)
MCHC: 34 g/dL (ref 30.0–36.0)
MCV: 100.3 fL — ABNORMAL HIGH (ref 80.0–100.0)
Platelets: 269 K/uL (ref 150–400)
RBC: 3.76 MIL/uL — ABNORMAL LOW (ref 4.22–5.81)
RDW: 16.5 % — ABNORMAL HIGH (ref 11.5–15.5)
WBC: 10.9 K/uL — ABNORMAL HIGH (ref 4.0–10.5)
nRBC: 0 % (ref 0.0–0.2)

## 2023-12-02 LAB — LIPASE, BLOOD: Lipase: 21 U/L (ref 11–51)

## 2023-12-02 MED ORDER — HYDROCODONE-ACETAMINOPHEN 5-325 MG PO TABS
1.0000 | ORAL_TABLET | Freq: Once | ORAL | Status: AC
  Administered 2023-12-02: 1 via ORAL
  Filled 2023-12-02: qty 1

## 2023-12-02 MED ORDER — ONDANSETRON HCL 4 MG/2ML IJ SOLN
4.0000 mg | Freq: Once | INTRAMUSCULAR | Status: AC | PRN
  Administered 2023-12-02: 4 mg via INTRAVENOUS
  Filled 2023-12-02: qty 2

## 2023-12-02 MED ORDER — SODIUM CHLORIDE 0.9 % IV BOLUS (SEPSIS)
1000.0000 mL | Freq: Once | INTRAVENOUS | Status: AC
  Administered 2023-12-02: 1000 mL via INTRAVENOUS

## 2023-12-02 MED ORDER — LIDOCAINE HCL URETHRAL/MUCOSAL 2 % EX GEL
1.0000 | Freq: Once | CUTANEOUS | Status: AC
  Administered 2023-12-02: 1 via URETHRAL
  Filled 2023-12-02: qty 11

## 2023-12-02 NOTE — ED Provider Notes (Signed)
 [facility] DEPARTMENT AT [facility] Provider Note   CSN: [id number] Arrival date & time: 12/02/23  [id number]     Patient presents with: Abdominal Pain   Kyle Berry is a 53 y.o. male.   The history is provided by the patient.  Patient w/history of pancreatitis, alcohol use disorder, nephrolithiasis, atrophic kidney presents for recurrent abdominal pain.  Patient was just in the hospital for pancreatitis.  Patient had a celiac plexus block with some improvement, but the pain is now returning. He reports nausea but no vomiting.  No fevers.  He reports constipation for the past 2 days.  He also reports decrease in urine output.     Past Medical History:  Diagnosis Date   Atrophy of right kidney    Chronic bronchitis (HCC)    Chronic lower back pain    Chronic pancreatitis (HCC)    Gastroparesis 06/2020   confirmed by emptying study at [facility]   GERD (gastroesophageal reflux disease)    Headache    once/month (05/13/2017)   Hematemesis 10/18/2017   Hematochezia 09/19/2021   High cholesterol    Hypertension    Migraine    a couple/year (05/13/2017)   Nephrolithiasis 05/13/2017   Pancreatitis    Pneumonia ~ 09/2015   Presence of pancreatic duct stent    Recurrent acute pancreatitis    Shortness of breath 05/22/2021    Prior to Admission medications   Medication Sig Start Date End Date Taking? Authorizing Provider  albuterol  (VENTOLIN  HFA) 108 (90 Base) MCG/ACT inhaler Inhale 2 puffs into the lungs every 6 (six) hours as needed for wheezing or shortness of breath.    [provider]  carvedilol  (COREG ) 3.125 MG tablet Take 1 tablet (3.125 mg total) by mouth 2 (two) times daily with a meal. 10/23/22   [name] Toribio GAILS, MD  famotidine  (PEPCID ) 20 MG tablet Take 1 tablet (20 mg total) by mouth 2 (two) times daily for 30 days, THEN 1 tablet (20 mg total) daily. Patient taking differently: Take 1 tablet (20 mg total) daily as needed for indigestion   10/23/22 11/22/23  [name] Toribio GAILS, MD  gabapentin  (NEURONTIN ) 300 MG capsule Take 300 mg by mouth 3 (three) times daily as needed (Pain).    [provider]  HYDROmorphone  (DILAUDID ) 2 MG tablet Take 0.5 tablets (1 mg total) by mouth every 4 (four) hours as needed for up to 3 days for moderate pain (pain score 4-6) or severe pain (pain score 7-10). 11/29/23 12/03/23  Everhart, Kirstie, DO  lipase/protease/amylase (CREON ) 36000 UNITS CPEP capsule Take 2 capsules with each meal and 1 capsule with each snack (2 snacks/day) 11/29/23   Alena Morrison, Reagan, MD  naloxone  (NARCAN ) nasal spray 4 mg/0.1 mL Place 1 spray into the nose daily as needed (opioid reversal). 07/16/22   [provider]  Naphazoline-Pheniramine (VISINE OP) Place 1 drop into both eyes daily.    [provider]  Nutritional Supplements (ENSURE ORIGINAL) LIQD Take 1 Container by mouth 2 (two) times daily.    [provider]  promethazine  (PHENERGAN ) 25 MG tablet Take 1 tablet (25 mg total) by mouth every 6 (six) hours as needed for up to 5 days for nausea or vomiting. 11/29/23 12/05/23  Everhart, Elyce, DO    Allergies: Norvasc  [amlodipine  besylate], Voltaren  [diclofenac ], Prilosec [omeprazole], Hydrochlorothiazide, Ultram  [tramadol ], and Prednisone     Review of Systems  Constitutional:  Negative for fever.  Respiratory:  Negative for shortness of breath.   Cardiovascular:  Negative for chest pain.  Gastrointestinal:  Positive for abdominal pain, constipation and nausea. Negative for vomiting.  Genitourinary:  Positive for difficulty urinating.    Updated Vital Signs BP (!) 134/97   Pulse 99   Temp 98.9 F (37.2 C) (Oral)   Resp 19   SpO2 100%   Physical Exam CONSTITUTIONAL: Well developed/well nourished, no distress HEAD: Normocephalic/atraumatic EYES: EOMI/PERRL, no icterus ENMT: Mucous membranes moist NECK: supple no meningeal signs SPINE/BACK:entire spine nontender CV: S1/S2  noted, no murmurs/rubs/gallops noted LUNGS: Lungs are clear to auscultation bilaterally, no apparent distress ABDOMEN: soft, nontender, no rebound or guarding, bowel sounds noted throughout abdomen GU:no cva tenderness NEURO: Pt is awake/alert/appropriate, moves all extremitiesx4.  No facial droop.   EXTREMITIES: pulses normal/equal, full ROM SKIN: warm, color normal PSYCH: no abnormalities of mood noted, alert and oriented to situation  (all labs ordered are listed, but only abnormal results are displayed) Labs Reviewed  COMPREHENSIVE METABOLIC PANEL WITH GFR - Abnormal; Notable for the following components:      Result Value   CO2 19 (*)    Glucose, Bld 106 (*)    Calcium 8.5 (*)    AST 60 (*)    ALT 58 (*)    Anion gap 16 (*)    All other components within normal limits  CBC - Abnormal; Notable for the following components:   WBC 10.9 (*)    RBC 3.76 (*)    Hemoglobin 12.8 (*)    HCT 37.7 (*)    MCV 100.3 (*)    RDW 16.5 (*)    All other components within normal limits  URINALYSIS, ROUTINE W REFLEX MICROSCOPIC - Abnormal; Notable for the following components:   Hgb urine dipstick SMALL (*)    Bacteria, UA RARE (*)    All other components within normal limits  LIPASE, BLOOD    EKG: EKG Interpretation Date/Time:  Monday December 02 2023 06:10:59 EDT Ventricular Rate:  89 PR Interval:  165 QRS Duration:  87 QT Interval:  359 QTC Calculation: 437 R Axis:   22  Text Interpretation: Sinus rhythm Probable left atrial enlargement RSR' in V1 or V2, right VCD or RVH Confirmed by Midge Golas ([id number]) on 12/02/2023 6:16:19 AM  Radiology: No results found.   Procedures   Medications Ordered in the ED  sodium chloride  0.9 % bolus 1,000 mL (has no administration in time range)  HYDROcodone -acetaminophen  (NORCO/VICODIN) 5-325 MG per tablet 1 tablet (has no administration in time range)  ondansetron  (ZOFRAN ) injection 4 mg (4 mg Intravenous Given 12/02/23 0622)  lidocaine   (XYLOCAINE ) 2 % jelly 1 Application (1 Application Urethral Given 12/02/23 0622)    Clinical Course as of 12/02/23 0719  Mon Dec 02, 2023  0624 Patient with long history of chronic pancreatitis presents for recurrent abdominal pain.  He was just in the hospital received a celiac plexus block and apparently had improvement and was taking oral fluids and pain medicines.  Since going home his pain is returned and he reports constipation and decreased urine output.  He is overall in no acute distress with no tenderness on exam Labs been ordered by nursing.  Will check bladder scan to see if there are signs of urinary retention. [DW]  [phone] Per records, patient has a history of chronic UTIs and is on Keflex .  Tonight he reported difficulty urinating and felt like he had urinary retention.  Foley catheter placed [DW]  0710 Patient feeling improved- he had large bowel movement Pt with  some dehydration, but lipase negative Abd soft, no significant tenderness [DW]  0718 Patient reports no recent alcohol use.  He also admits to not taking his home pain medicine.  He has tried to slowly increase his diet.  He has had decreased oral intake and decreased fluids, likely contributed to dehydration.  Patient is requesting that we remove his Foley catheter [DW]  0719 He is likely having residual pain from his recent bout of pancreatitis.  Labs are overall reassuring.  Patient would like to follow-up with his GI specialist soon and he sees [facility]  At this point we will defer any further imaging. Will give fluids, pain medicines and likely discharge home [DW]    Clinical Course User Index [DW] Midge Golas, MD                                 Medical Decision Making Amount and/or Complexity of Data Reviewed Labs: ordered.  Risk Prescription drug management.   This patient presents to the ED for concern of abdominal pain, this involves an extensive number of treatment options, and is a complaint that  carries with it a high risk of complications and morbidity.  The differential diagnosis includes but is not limited to cholecystitis, cholelithiasis, pancreatitis, gastritis, peptic ulcer disease, appendicitis, bowel obstruction, bowel perforation, diverticulitis, AAA, ischemic bowel    Comorbidities that complicate the patient evaluation: Patient's presentation is complicated by their history of chronic pancreatitis  Social Determinants of Health: Patient's history of alcohol use disorder  increases the complexity of managing their presentation  Additional history obtained: Records reviewed previous admission documents  Lab Tests: I Ordered, and personally interpreted labs.  The pertinent results include: Dehydration  Test Considered: Since patient is improving, and labs reassuring will defer CT imaging  Reevaluation: After the interventions noted above, I reevaluated the patient and found that they have :improved  Complexity of problems addressed: Patient's presentation is most consistent with  exacerbation of chronic illness  Disposition: After consideration of the diagnostic results and the patient's response to treatment,  I feel that the patent would benefit from discharge  .        Final diagnoses:  Dehydration  Chronic pancreatitis, unspecified pancreatitis type [facility])    ED Discharge Orders     None          Midge Golas, MD 12/02/23 [phone]

## 2023-12-02 NOTE — ED Notes (Signed)
 Patient is using  the bedside commode

## 2023-12-02 NOTE — ED Triage Notes (Signed)
 Patient brought in by EMS for abdominal pain. Patient received a abdominal nerve block last Friday and was D/C'd on Saturday. Patient states since then he has not been able to have a bowel movement or pass urine. Patient reports abdominal pain all over with some nausea. Denies vomiting. Denies flatus. Patient stopped blood thinners on Thursday. Reports pain 10/10.

## 2023-12-11 ENCOUNTER — Telehealth: Payer: Self-pay | Admitting: Gastroenterology

## 2023-12-11 NOTE — Telephone Encounter (Signed)
 Received a call from patient stating that [facility] preformed procedure on him, he was not satisfied. Is experiencing significant GI issues and is requesting a f/u call to discuss condition. Please review and advise   Thank you

## 2023-12-11 NOTE — Telephone Encounter (Signed)
 Spoke with patient. He reports that he has recently been at [facility] with a flair of pancreatitis. On 08/14 patient had a CT Celiac Plexus block. He reports that on 08/18, he went back to [facility] due to increasing pain and nausea. Pt also states he wasn't able to pee and had to have a catheter, which has sense resolved. Patient reports he is still having the pain, but I can work through that. What I can't work through is this nausea. Patient has Phenergan  at home but he is unable to take that and go to work. When asked about Zofran , patent states I threw that right back up. He is able to eat occasionally, but not everyday. He has not been seen in clinic since 01/2020. Pt had appt originally scheduled for 09/12. Was able to schedule pt sooner with Camie for 12/13/2023.

## 2023-12-11 NOTE — Telephone Encounter (Signed)
 Called pt back with Zofran  ODT recommendation. Patient states he has tried that in the past, and it didn't help. Discussed possibility and reasons to return to ER. Patient verbalized understanding.

## 2023-12-13 ENCOUNTER — Encounter: Payer: Self-pay | Admitting: Gastroenterology

## 2023-12-13 ENCOUNTER — Ambulatory Visit (INDEPENDENT_AMBULATORY_CARE_PROVIDER_SITE_OTHER): Admitting: Gastroenterology

## 2023-12-13 VITALS — BP 126/80 | HR 68 | Ht 73.0 in | Wt 165.0 lb

## 2023-12-13 DIAGNOSIS — G8929 Other chronic pain: Secondary | ICD-10-CM

## 2023-12-13 DIAGNOSIS — K861 Other chronic pancreatitis: Secondary | ICD-10-CM | POA: Diagnosis not present

## 2023-12-13 DIAGNOSIS — K8689 Other specified diseases of pancreas: Secondary | ICD-10-CM

## 2023-12-13 DIAGNOSIS — R109 Unspecified abdominal pain: Secondary | ICD-10-CM | POA: Diagnosis not present

## 2023-12-13 DIAGNOSIS — Z860101 Personal history of adenomatous and serrated colon polyps: Secondary | ICD-10-CM

## 2023-12-13 DIAGNOSIS — K219 Gastro-esophageal reflux disease without esophagitis: Secondary | ICD-10-CM

## 2023-12-13 DIAGNOSIS — Q453 Other congenital malformations of pancreas and pancreatic duct: Secondary | ICD-10-CM

## 2023-12-13 DIAGNOSIS — R112 Nausea with vomiting, unspecified: Secondary | ICD-10-CM

## 2023-12-13 MED ORDER — FAMOTIDINE 20 MG PO TABS: 20.0000 mg | ORAL_TABLET | Freq: Two times a day (BID) | ORAL | 3 refills | Status: DC

## 2023-12-13 NOTE — Patient Instructions (Addendum)
 We have sent the following medications to your pharmacy for you to pick up at your convenience: Pepcid  20 mg twice a day  Follow up with Interventional Radiology  _______________________________________________________  If your blood pressure at your visit was 140/90 or greater, please contact your primary care physician to follow up on this.  _______________________________________________________  If you are age 54 or older, your body mass index should be between 23-30. Your Body mass index is 21.77 kg/m. If this is out of the aforementioned range listed, please consider follow up with your Primary Care Provider.  If you are age 69 or younger, your body mass index should be between 19-25. Your Body mass index is 21.77 kg/m. If this is out of the aformentioned range listed, please consider follow up with your Primary Care Provider.   ________________________________________________________  The [facility] providers would like to encourage you to use MYCHART to communicate with providers for non-urgent requests or questions.  Due to long hold times on the telephone, sending your provider a message by [facility] may be a faster and more efficient way to get a response.  Please allow 48 business hours for a response.  Please remember that this is for non-urgent requests.  _______________________________________________________  [facility] is using a team-based approach to care.  Your team is made up of your doctor and two to three APPS. Our APPS (Nurse Practitioners and Physician Assistants) work with your physician to ensure care continuity for you. They are fully qualified to address your health concerns and develop a treatment plan. They communicate directly with your gastroenterologist to care for you. Seeing the Advanced Practice Practitioners on your physician's team can help you by facilitating care more promptly, often allowing for earlier appointments, access to diagnostic  testing, procedures, and other specialty referrals.    I appreciate the  opportunity to care for you  Thank You   Camie Heinz,PA-C

## 2023-12-13 NOTE — Progress Notes (Signed)
 Kyle Berry [id number] [birth year]   Chief Complaint: Abdominal pain, nausea  Referring Provider: [facility]* Primary GI MD: Dr. Shila  HPI: Kyle Berry is a 53 y.o. male with past medical history of chronic bronchitis, chronic low back pain, recurrent acute pancreatitis, chronic pancreatitis with presence of pancreatic duct stent, gastroparesis, GERD, HTN, nephrolithiasis, migraines who presents today for a complaint of abdominal pain and nausea.    Patient admitted to the hospital 11/18/2023 to 11/30/2023 with acute on chronic pancreatitis.  Was placed on LR at 100 mL/h for the first 48 hours of hospitalization.  Nausea treated with Compazine , Reglan , Phenergan , and Zofran .  Pain controlled with Dilaudid .  Patient failed p.o. trials on 8/7 and 8/10.  Pancreatic enzymes restarted with meals.  GI and IR consulted for repeat celiac plexus block.  IR performed celiac plexus block on 11/28/2023.  Patient had improvement of his pain and was able to tolerate oral soft foods with only p.o. medications on 11/29/2023.  It was discussed that if patient had good response from the celiac plexus block it could be repeated in several months per IR.  Dr. Jennefer suggested neurolysis could also be done. Advised to continue Creon  at home.  12/02/2023 patient seen in the ED with complaint of recurrent abdominal pain and nausea.  Had decreased urine output as well as constipation for the past 2 days.  Foley catheter was placed.  Patient had a large bowel movement and was feeling better.  Lipase was negative, abdomen soft with no significant tenderness.  Admitted to not taking his home pain medication.  Had decreased oral intake and decreased fluids contributing to dehydration.  Advised to follow-up with GI.  Patient called 12/11/2023 reporting he continued to have pain which he felt he was able to work through, but was having significant nausea.  Had Phenergan  at home but was unable to take  that and go to work.  Stated he was vomiting Zofran .  Able to eat occasionally but not every day.  Had originally been scheduled for follow-up on 12/27/2023 but was able to be worked in sooner. Jessica Zehr, PA-C recommended Zofran  and ODT (dissolvable).  Patient stated he had tried that before and it did not help.  ED precautions were given.   Patient states he is doing okay today as he took Phenergan  this morning and that does help with his nausea, but it does make him groggy, so he is only able to take it on the days that he is not working.  Continues to have intermittent epigastric pain which can radiate to his RUQ/LUQ and also to his back occasionally.  States that pain level can be up to an 8/10 but denies any worsening of his pain over the last couple weeks.  States that celiac plexus block in the past has done better for pain control, but states that this last time he only had improvement of pain for a few days before it returned.  He has continued to eat 3 meals a day as well as snacks and is taking Creon  as prescribed.  States that in the morning he can have some vomiting or dry heaving which tends to improve throughout the day.  Denies any fever or chills.  Since taking Creon  he is now having 3 formed bowel movements daily and denies any diarrhea, blood in his stool, or melena.  He is on Pepcid  20 mg daily for reflux.  States he does have some occasional breakthrough symptoms less  than once a week but frequent enough to notice.  He denies any alcohol consumption.  Previous GI Procedures/Imaging   CT A/P 11/22/2023 1. Minimal peripancreatic stranding without worsening compared to the prior exam. No organized fluid collections. 2. A few fluid-filled loops of borderline distended nonspecific small bowel in the anterior abdomen. No obstruction. 3. Severe atrophy of the right kidney. Small nonobstructing left kidney stone.  CT abd/pelvis 11/16/23 IMPRESSION: 1. Acute on chronic  pancreatitis. No abscess or pseudocyst. 2. Atrophic right kidney. 3. A 5 mm nonobstructing left renal inferior pole calculus. No hydronephrosis. 4. No bowel obstruction. Normal appendix.  Upper EUS 10/20/2022 EGD impression:  - No gross lesions in the entire esophagus. Z- line irregular, 43 cm from the incisors. - 1 cm hiatal hernia. - Erythematous mucosa in the stomach. Biopsied. - No gross lesions in the duodenal bulb, in the first portion of the duodenum and in the second portion of the duodenum. - Normal major papilla ( could not truly identify sphincteromy sites) .  EUS impression:  - Pancreatic parenchymal abnormalities consisting of hyperechoic foci, lobularity and hyperechoic strands were noted in the pancreatic head, genu of the pancreas, pancreatic body and pancreatic tail. - The pancreatic duct had hyperechoic walls in the pancreatic head, genu of the pancreas, body of the pancreas and tail of the pancreas. No evidence of intraductal pancreatic stones. - There was prominence of the common bile duct and in the common hepatic duct. - There was a suggestion of an inflammatory narrowing/ stricture stricture in the lower third of the main bile duct. His LFTs are completely normal. - No malignant- appearing lymph nodes were visualized in the celiac region ( level 20) , peripancreatic region and porta hepatis region. - Celiac plexus block performed as above.   ERCP 11/02/2020 - No gross lesions in esophagus. Z- line irregular, 43 cm from the incisors. - Gastritis. Biopsied. - Gastric polyps. Biopsied. - Erythematous duodenopathy in bulb. - Prior minor papilla sphincterotomy appeared open. One stent was seen in the minor papilla - this was able to be removed. - Prior biliary sphincterotomy appeared open. - Prior pancreatic sphincterotomy appeared open. - Filling defects consistent with mucin vs pancreatic stones from recent retained stent were seen on the pancreatogram. 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 - Pancreatic stones were found and removed through the minor duct. Complete removal was accomplished via balloon sweeps.  Colonoscopy 11/02/2019 1. Post polypectomy bleed. Likely culprit in the cecum treated with clipping due to the presence of stigmata. Also, clipping of large sigmoid colon polypectomy site as described. No active bleeding at the time of the procedure. Hemostasis maintained post therapy  2. Diverticulosis.  Colonoscopy 10/30/2019 - Preparation of the colon was fair.  - One 20 mm polyp in the cecum, removed with a hot snare. Resected and retrieved.  - One 5 mm polyp in the transverse colon, removed with a cold snare. Resected and retrieved.  - One less than 1 mm polyp in the transverse colon, removed with a cold biopsy forceps. Resected and retrieved.  - One 30 mm polyp at the recto- sigmoid colon, removed with a hot snare. Resected and retrieved. Clip ( MR conditional) was placed.  - Diverticulosis in the sigmoid colon, in the ascending colon and in the cecum.  - Non- bleeding internal hemorrhoids. - Recall 3 years Path: 2. Surgical [P], colon, cecal, polyp - TUBULAR ADENOMA. - NEGATIVE FOR HIGH GRADE DYSPLASIA. 3. Surgical [P], colon, transverse,  polyp (2) - TUBULAR ADENOMA (X2). - NEGATIVE FOR HIGH GRADE DYSPLASIA. 4. Surgical [P], colon, rectosigmoid, polyp - TUBULAR ADENOMA. - NEGATIVE FOR HIGH GRADE DYSPLASIA.  Past Medical History:  Diagnosis Date   Atrophy of right kidney    Chronic bronchitis (HCC)    Chronic lower back pain    Chronic pancreatitis (HCC)    Gastroparesis 06/2020   confirmed by emptying study at [facility]   GERD (gastroesophageal reflux disease)    Headache    once/month (05/13/2017)   Hematemesis 10/18/2017   Hematochezia 09/19/2021   High cholesterol    Hypertension    Migraine    a couple/year (05/13/2017)   Nephrolithiasis 05/13/2017   Pancreatitis    Pneumonia ~ 09/2015   Presence of pancreatic duct  stent    Recurrent acute pancreatitis    Shortness of breath 05/22/2021    Past Surgical History:  Procedure Laterality Date   BIOPSY  11/02/2020   Procedure: BIOPSY;  Surgeon: Wilhelmenia Aloha Raddle., MD;  Location: [facility] ENDOSCOPY;  Service: Gastroenterology;;   BIOPSY  10/20/2022   Procedure: BIOPSY;  Surgeon: Wilhelmenia Aloha Raddle., MD;  Location: [facility] ENDOSCOPY;  Service: Gastroenterology;;   CHOLECYSTECTOMY N/A 10/23/2017   Procedure: LAPAROSCOPIC CHOLECYSTECTOMY WITH INTRAOPERATIVE CHOLANGIOGRAM;  Surgeon: Gladis Cough, MD;  Location: [facility] ORS;  Service: General;  Laterality: N/A;   COLONOSCOPY WITH PROPOFOL  N/A 11/02/2019   Procedure: COLONOSCOPY WITH PROPOFOL ;  Surgeon: Abran Norleen SAILOR, MD;  Location: [facility] ENDOSCOPY;  Service: Endoscopy;  Laterality: N/A;   ERCP N/A 11/02/2020   Procedure: ENDOSCOPIC RETROGRADE CHOLANGIOPANCREATOGRAPHY (ERCP);  Surgeon: Wilhelmenia Aloha Raddle., MD;  Location: [facility] ENDOSCOPY;  Service: Gastroenterology;  Laterality: N/A;   ESOPHAGOGASTRODUODENOSCOPY N/A 11/02/2020   Procedure: ESOPHAGOGASTRODUODENOSCOPY (EGD);  Surgeon: Wilhelmenia Aloha Raddle., MD;  Location: [facility] ENDOSCOPY;  Service: Gastroenterology;  Laterality: N/A;   ESOPHAGOGASTRODUODENOSCOPY (EGD) WITH PROPOFOL  N/A 11/06/2019   Procedure: ESOPHAGOGASTRODUODENOSCOPY (EGD) WITH PROPOFOL ;  Surgeon: Abran Norleen SAILOR, MD;  Location: [facility] ENDOSCOPY;  Service: Endoscopy;  Laterality: N/A;   ESOPHAGOGASTRODUODENOSCOPY (EGD) WITH PROPOFOL  N/A 10/20/2022   Procedure: ESOPHAGOGASTRODUODENOSCOPY (EGD) WITH PROPOFOL ;  Surgeon: Wilhelmenia Aloha Raddle., MD;  Location: [facility] ENDOSCOPY;  Service: Gastroenterology;  Laterality: N/A;   HEMOSTASIS CLIP PLACEMENT  11/02/2019   Procedure: HEMOSTASIS CLIP PLACEMENT;  Surgeon: Abran Norleen SAILOR, MD;  Location: [facility] ENDOSCOPY;  Service: Endoscopy;;   NEUROLYTIC CELIAC PLEXUS  10/20/2022   Procedure: NEUROLYTIC CELIAC PLEXUS;  Surgeon: Wilhelmenia Aloha Raddle., MD;  Location: [facility] ENDOSCOPY;  Service:  Gastroenterology;;   NO PAST SURGERIES     REMOVAL OF STONES  11/02/2020   Procedure: REMOVAL OF STONES;  Surgeon: Wilhelmenia Aloha Raddle., MD;  Location: [facility] ENDOSCOPY;  Service: Gastroenterology;;   CLEDA REMOVAL  11/02/2020   Procedure: STENT REMOVAL;  Surgeon: Wilhelmenia Aloha Raddle., MD;  Location: [facility] ENDOSCOPY;  Service: Gastroenterology;;   UPPER ESOPHAGEAL ENDOSCOPIC ULTRASOUND (EUS) N/A 10/20/2022   Procedure: UPPER ESOPHAGEAL ENDOSCOPIC ULTRASOUND (EUS);  Surgeon: Wilhelmenia Aloha Raddle., MD;  Location: [facility] ENDOSCOPY;  Service: Gastroenterology;  Laterality: N/A;    Current Outpatient Medications  Medication Sig Dispense Refill   albuterol  (VENTOLIN  HFA) 108 (90 Base) MCG/ACT inhaler Inhale 2 puffs into the lungs every 6 (six) hours as needed for wheezing or shortness of breath.     carvedilol  (COREG ) 3.125 MG tablet Take 1 tablet (3.125 mg total) by mouth 2 (two) times daily with a meal. 60 tablet 1   famotidine  (PEPCID ) 20 MG tablet Take 1 tablet (20 mg total) by mouth 2 (two)  times daily. 60 tablet 3   gabapentin  (NEURONTIN ) 300 MG capsule Take 300 mg by mouth 3 (three) times daily as needed (Pain).     lipase/protease/amylase (CREON ) 36000 UNITS CPEP capsule Take 2 capsules with each meal and 1 capsule with each snack (2 snacks/day) 100 capsule 2   naloxone  (NARCAN ) nasal spray 4 mg/0.1 mL Place 1 spray into the nose daily as needed (opioid reversal).     Naphazoline-Pheniramine (VISINE OP) Place 1 drop into both eyes daily.     Nutritional Supplements (ENSURE ORIGINAL) LIQD Take 1 Container by mouth 2 (two) times daily.     promethazine  (PHENERGAN ) 25 MG tablet Take 1 tablet (25 mg total) by mouth every 6 (six) hours as needed for up to 5 days for nausea or vomiting. 20 tablet 0   No current facility-administered medications for this visit.    Allergies as of 12/13/2023 - Review Complete 12/13/2023  Allergen Reaction Noted   Norvasc  [amlodipine  besylate] Other (See Comments)  08/15/2016   Voltaren  [diclofenac ] Hives and Other (See Comments) 12/14/2013   Prilosec [omeprazole] Other (See Comments) 08/15/2016   Hydrochlorothiazide Itching 09/19/2021   Ultram  [tramadol ] Other (See Comments) 09/24/2022   Prednisone  Hives and Other (See Comments) 12/14/2013    Family History  Problem Relation Age of Onset   Hypertension Other    Hypertension Mother    Hypertension Father    Kidney disease Father    Hypertension Sister    Diabetes Sister    Hypertension Brother    Pancreatic cancer Paternal Grandmother    Colon cancer Cousin    Stomach cancer Neg Hx    Esophageal cancer Neg Hx     Social History   Tobacco Use   Smoking status: Never   Smokeless tobacco: Never  Vaping Use   Vaping status: Never Used  Substance Use Topics   Alcohol use: Not Currently    Comment: remote h/o heavy use   Drug use: No     Review of Systems:    Constitutional: No unexplained weight loss, fever, chills Cardiovascular: No chest pain  Respiratory: No SOB Gastrointestinal: See HPI and otherwise negative Hematologic: No bleeding    Physical Exam:  Vital signs: BP 126/80   Pulse 68   Ht 6' 1 (1.854 m)   Wt 165 lb (74.8 kg)   BMI 21.77 kg/m   Wt Readings from Last 3 Encounters:  12/13/23 165 lb (74.8 kg)  11/26/23 167 lb 15.9 oz (76.2 kg)  10/21/23 168 lb (76.2 kg)   Constitutional: Pleasant male in NAD, alert and cooperative Head:  Normocephalic and atraumatic.  Eyes: No scleral icterus.  Respiratory: Respirations even and unlabored. Lungs clear to auscultation bilaterally.  No wheezes, crackles, or rhonchi.  Cardiovascular:  Regular rate and rhythm. No murmurs. No peripheral edema. Gastrointestinal:  Soft, nondistended, tender to palpation of epigastrium. No rebound or guarding. Normal bowel sounds. No appreciable masses or hepatomegaly. Rectal:  Not performed.  Neurologic:  Alert and oriented x4;  grossly normal neurologically.  Skin:   Dry and intact  without significant lesions or rashes. Psychiatric: Oriented to person, place and time. Demonstrates good judgement and reason without abnormal affect or behaviors.   RELEVANT LABS AND IMAGING: CBC    Component Value Date/Time   WBC 10.9 (H) 12/02/2023 0614   RBC 3.76 (L) 12/02/2023 0614   HGB 12.8 (L) 12/02/2023 0614   HCT 37.7 (L) 12/02/2023 0614   PLT 269 12/02/2023 0614   MCV 100.3 (H) 12/02/2023  9385   MCH 34.0 12/02/2023 0614   MCHC 34.0 12/02/2023 0614   RDW 16.5 (H) 12/02/2023 0614   LYMPHSABS 2.4 04/02/2023 2104   MONOABS 0.6 04/02/2023 2104   EOSABS 0.0 04/02/2023 2104   BASOSABS 0.0 04/02/2023 2104    CMP     Component Value Date/Time   NA 138 12/02/2023 0614   K 4.6 12/02/2023 0614   CL 103 12/02/2023 0614   CO2 19 (L) 12/02/2023 0614   GLUCOSE 106 (H) 12/02/2023 0614   BUN 13 12/02/2023 0614   CREATININE 1.11 12/02/2023 0614   CALCIUM 8.5 (L) 12/02/2023 0614   PROT 7.3 12/02/2023 0614   ALBUMIN 3.7 12/02/2023 0614   AST 60 (H) 12/02/2023 0614   ALT 58 (H) 12/02/2023 0614   ALKPHOS 51 12/02/2023 0614   BILITOT 0.7 12/02/2023 0614   GFRNONAA >60 12/02/2023 0614   GFRAA >60 11/06/2019 0241     Assessment/Plan:   History of recurrent pancreatitis Chronic pancreatitis Chronic abdominal pain Pancreatic insufficiency GERD Patient seen today for follow-up of chronic pancreatitis.  Admitted to the hospital 11/18/2023 to 11/30/2023 with acute on chronic pancreatitis which required antiemetics, IV fluids, Dilaudid .  Ended up having repeat celiac plexus block on 11/28/2023 with some improvement of his pain, though today states that abdominal pain returned after few days.  Also endorses chronic nausea and intermittent vomiting. He is able to eat and drink, keeps most of his food down and is taking Creon  with meals and snacks as prescribed.  Weight is stable.  Denies any fever or chills.  States that though he has chronic abdominal pain, his pain level is not  worsening.  Denies alcohol consumption. Nausea is controlled with Phenergan , but he is not able to take this every day as it causes grogginess.  Takes on the days that he is not working.  Case and plan were discussed with Dr. Shila in office today.    - Will have patient follow-up with IR to discuss repeat celiac plexus block versus neurolysis for management of chronic pain.  Can send new referral if needed. - Increase famotidine  to 20 mg twice daily.  If no improvement consider starting PPI for breakthrough reflux. - Continue Creon  36,000 lipase units, 2 with meals, 1 with snacks.  History of adenomatous colon polyp Last colonoscopy done 10/2019 with multiple adenomatous colon polyps removed and recommended recall in 3 years.  Will hold off on scheduling at this time due to current issues with chronic pancreatitis and pain/nausea/vomiting.  - Follow up with 3 months to discuss repeat colonoscopy   Camie Furbish, PA-C [facility] 12/13/2023, 12:02 PM  Patient Care Team: [facility] as PCP - General Raford Riggs, MD as PCP - Cardiology (Cardiology)

## 2023-12-17 ENCOUNTER — Telehealth: Payer: Self-pay | Admitting: Gastroenterology

## 2023-12-17 NOTE — Telephone Encounter (Signed)
 Inbound call from patient stating that he needs a referral to IR for repeat celiac plexus block versus neurolysis for management of chronic pain. Please advise.

## 2023-12-17 NOTE — Telephone Encounter (Signed)
 The pt has been advised that a referral was made to IR and that office will call to set up.  I did also provide him with the phone number as well.

## 2023-12-26 ENCOUNTER — Inpatient Hospital Stay (HOSPITAL_COMMUNITY)
Admission: EM | Admit: 2023-12-26 | Discharge: 2023-12-31 | DRG: 440 | Disposition: A | Discharge status: Home / Self Care | Attending: Internal Medicine | Admitting: Internal Medicine

## 2023-12-26 ENCOUNTER — Encounter (HOSPITAL_COMMUNITY): Payer: Self-pay

## 2023-12-26 ENCOUNTER — Other Ambulatory Visit: Payer: Self-pay

## 2023-12-26 DIAGNOSIS — E860 Dehydration: Secondary | ICD-10-CM | POA: Diagnosis present

## 2023-12-26 DIAGNOSIS — R52 Pain, unspecified: Secondary | ICD-10-CM

## 2023-12-26 DIAGNOSIS — K859 Acute pancreatitis without necrosis or infection, unspecified: Principal | ICD-10-CM | POA: Diagnosis present

## 2023-12-26 DIAGNOSIS — Z79899 Other long term (current) drug therapy: Secondary | ICD-10-CM

## 2023-12-26 DIAGNOSIS — Z800 Family history of malignant neoplasm of digestive organs: Secondary | ICD-10-CM

## 2023-12-26 DIAGNOSIS — Z841 Family history of disorders of kidney and ureter: Secondary | ICD-10-CM

## 2023-12-26 DIAGNOSIS — G8929 Other chronic pain: Secondary | ICD-10-CM | POA: Diagnosis present

## 2023-12-26 DIAGNOSIS — K3184 Gastroparesis: Secondary | ICD-10-CM | POA: Diagnosis present

## 2023-12-26 DIAGNOSIS — E7800 Pure hypercholesterolemia, unspecified: Secondary | ICD-10-CM | POA: Diagnosis present

## 2023-12-26 DIAGNOSIS — Z833 Family history of diabetes mellitus: Secondary | ICD-10-CM

## 2023-12-26 DIAGNOSIS — Z885 Allergy status to narcotic agent status: Secondary | ICD-10-CM

## 2023-12-26 DIAGNOSIS — I10 Essential (primary) hypertension: Secondary | ICD-10-CM | POA: Diagnosis present

## 2023-12-26 DIAGNOSIS — R112 Nausea with vomiting, unspecified: Secondary | ICD-10-CM

## 2023-12-26 DIAGNOSIS — Z8249 Family history of ischemic heart disease and other diseases of the circulatory system: Secondary | ICD-10-CM

## 2023-12-26 DIAGNOSIS — R040 Epistaxis: Secondary | ICD-10-CM | POA: Diagnosis present

## 2023-12-26 DIAGNOSIS — K861 Other chronic pancreatitis: Secondary | ICD-10-CM | POA: Diagnosis not present

## 2023-12-26 DIAGNOSIS — K219 Gastro-esophageal reflux disease without esophagitis: Secondary | ICD-10-CM | POA: Diagnosis present

## 2023-12-26 DIAGNOSIS — N261 Atrophy of kidney (terminal): Secondary | ICD-10-CM | POA: Diagnosis present

## 2023-12-26 DIAGNOSIS — D696 Thrombocytopenia, unspecified: Secondary | ICD-10-CM | POA: Diagnosis present

## 2023-12-26 LAB — COMPREHENSIVE METABOLIC PANEL WITH GFR
ALT: 42 U/L (ref 0–44)
AST: 40 U/L (ref 15–41)
Albumin: 4.3 g/dL (ref 3.5–5.0)
Alkaline Phosphatase: 80 U/L (ref 38–126)
Anion gap: 19 — ABNORMAL HIGH (ref 5–15)
BUN: 9 mg/dL (ref 6–20)
CO2: 20 mmol/L — ABNORMAL LOW (ref 22–32)
Calcium: 9.4 mg/dL (ref 8.9–10.3)
Chloride: 101 mmol/L (ref 98–111)
Creatinine, Ser: 1.04 mg/dL (ref 0.61–1.24)
GFR, Estimated: >60 mL/min (ref >60)
Glucose, Bld: 136 mg/dL — ABNORMAL HIGH (ref 70–99)
Potassium: 3.8 mmol/L (ref 3.5–5.1)
Sodium: 140 mmol/L (ref 135–145)
Total Bilirubin: 1.1 mg/dL (ref 0.0–1.2)
Total Protein: 7.4 g/dL (ref 6.5–8.1)

## 2023-12-26 LAB — CBC
HCT: 41 % (ref 39.0–52.0)
Hemoglobin: 13.8 g/dL (ref 13.0–17.0)
MCH: 34.4 pg — ABNORMAL HIGH (ref 26.0–34.0)
MCHC: 33.7 g/dL (ref 30.0–36.0)
MCV: 102.2 fL — ABNORMAL HIGH (ref 80.0–100.0)
Platelets: 127 K/uL — ABNORMAL LOW (ref 150–400)
RBC: 4.01 MIL/uL — ABNORMAL LOW (ref 4.22–5.81)
RDW: 15.3 % (ref 11.5–15.5)
WBC: 10.7 K/uL — ABNORMAL HIGH (ref 4.0–10.5)
nRBC: 0.2 % (ref 0.0–0.2)

## 2023-12-26 LAB — URINALYSIS, ROUTINE W REFLEX MICROSCOPIC
Bilirubin Urine: NEGATIVE
Glucose, UA: NEGATIVE mg/dL
Ketones, ur: 20 mg/dL — AB
Leukocytes,Ua: NEGATIVE
Nitrite: NEGATIVE
Protein, ur: 30 mg/dL — AB
Specific Gravity, Urine: 1.017 (ref 1.005–1.030)
pH: 5 (ref 5.0–8.0)

## 2023-12-26 LAB — LIPASE, BLOOD: Lipase: 589 U/L — ABNORMAL HIGH (ref 11–51)

## 2023-12-26 MED ORDER — HYDROMORPHONE HCL 1 MG/ML IJ SOLN
1.0000 mg | INTRAMUSCULAR | Status: DC | PRN
  Administered 2023-12-26 – 2023-12-27 (×6): 1 mg via INTRAVENOUS
  Filled 2023-12-26 (×6): qty 1

## 2023-12-26 MED ORDER — SODIUM CHLORIDE 0.9 % IV SOLN
25.0000 mg | Freq: Once | INTRAVENOUS | Status: AC
  Administered 2023-12-26: 25 mg via INTRAVENOUS
  Filled 2023-12-26: qty 25

## 2023-12-26 MED ORDER — SENNOSIDES-DOCUSATE SODIUM 8.6-50 MG PO TABS: 1.0000 | ORAL_TABLET | Freq: Every evening | ORAL | Status: DC | PRN

## 2023-12-26 MED ORDER — HYDROMORPHONE HCL 1 MG/ML IJ SOLN
1.0000 mg | Freq: Once | INTRAMUSCULAR | Status: AC
  Administered 2023-12-26: 1 mg via INTRAVENOUS
  Filled 2023-12-26: qty 1

## 2023-12-26 MED ORDER — ACETAMINOPHEN 650 MG RE SUPP: 650.0000 mg | Freq: Four times a day (QID) | RECTAL | Status: DC | PRN

## 2023-12-26 MED ORDER — LABETALOL HCL 5 MG/ML IV SOLN
10.0000 mg | Freq: Once | INTRAVENOUS | Status: AC
  Administered 2023-12-26: 10 mg via INTRAVENOUS
  Filled 2023-12-26: qty 4

## 2023-12-26 MED ORDER — HYDROCODONE-ACETAMINOPHEN 5-325 MG PO TABS
1.0000 | ORAL_TABLET | ORAL | Status: DC | PRN
  Administered 2023-12-27: 1 via ORAL
  Filled 2023-12-26: qty 2

## 2023-12-26 MED ORDER — ACETAMINOPHEN 325 MG PO TABS: 650.0000 mg | ORAL_TABLET | Freq: Four times a day (QID) | ORAL | Status: DC | PRN

## 2023-12-26 MED ORDER — SODIUM CHLORIDE 0.9 % IV BOLUS
1000.0000 mL | Freq: Once | INTRAVENOUS | Status: AC
  Administered 2023-12-26: 1000 mL via INTRAVENOUS

## 2023-12-26 MED ORDER — ONDANSETRON HCL 4 MG PO TABS: 4.0000 mg | ORAL_TABLET | Freq: Four times a day (QID) | ORAL | Status: DC | PRN

## 2023-12-26 MED ORDER — LACTATED RINGERS IV SOLN
INTRAVENOUS | Status: AC
Start: 2023-12-26 — End: 2023-12-27

## 2023-12-26 MED ORDER — ONDANSETRON HCL 4 MG/2ML IJ SOLN
4.0000 mg | Freq: Four times a day (QID) | INTRAMUSCULAR | Status: DC | PRN
  Administered 2023-12-27: 4 mg via INTRAVENOUS
  Filled 2023-12-26: qty 2

## 2023-12-26 MED ORDER — ENOXAPARIN SODIUM 40 MG/0.4ML IJ SOSY
40.0000 mg | PREFILLED_SYRINGE | INTRAMUSCULAR | Status: DC
Start: 2023-12-27 — End: 2023-12-28
  Administered 2023-12-27: 40 mg via SUBCUTANEOUS
  Filled 2023-12-26: qty 0.4

## 2023-12-26 MED ORDER — HYDRALAZINE HCL 20 MG/ML IJ SOLN
10.0000 mg | INTRAMUSCULAR | Status: DC | PRN
  Administered 2023-12-26 – 2023-12-27 (×2): 10 mg via INTRAVENOUS
  Filled 2023-12-26 (×2): qty 1

## 2023-12-26 NOTE — H&P (Signed)
 History and Physical    Kyle Berry FMW:[id number] DOB: 10/11/1970 DOA: 12/26/2023  PCP: [facility]  Patient coming from: Home  I have personally briefly reviewed patient's old medical records in [facility]  Chief Complaint: Abdominal pain  HPI: Kyle Berry is a 53 y.o. male with medical history significant for chronic pancreatitis, HTN, gastroparesis, chronic abdominal pain who presented to the ED for evaluation of abdominal pain and nausea.  Patient is frequently admitted for acute on chronic pancreatitis.  He was last admitted 8/4-8/16 for the same.  He has had success with prior celiac plexus block and underwent repeat celiac plexus block by IR on 11/28/2023 with consideration of neurolysis in the future.  He was seen in the GI clinic as follow-up on 12/13/2023 with stable symptoms.  He was recommended to follow-up with IR to discuss repeat celiac plexus block versus neurolysis for management of his chronic pain.  He was continued on Creon  and Pepcid .  Patient states that he has not really been pain-free since his last admission.  He had worsening of this pain starting 3 days ago.  Pain has been all over his abdomen but more prominent in the epigastric region with radiation to his back.  He has had significant nausea and vomiting and has not been able to maintain any adequate oral intake or keep his medications down.  He reports having 1 loose stool yesterday.  He denies current alcohol or tobacco use.  ED Course  Labs/Imaging on admission: I have personally reviewed following labs and imaging studies.  Initial vitals showed BP 152/105, pulse 91, RR 18, temp 98.3 F, SpO2 99% on room air.  Labs showed lipase 589, sodium 140, potassium 3.8, bicarb 20, BUN 9, creatinine 1.04, serum glucose 136, LFTs within normal limits, WBC 10.7, hemoglobin 13.8, platelets 127.  Patient was given 1 L normal saline, IV Dilaudid  1 mg x 3, IV labetalol  10 mg,  IV Phenergan .  The hospitalist service was consulted for admission.  Review of Systems: All systems reviewed and are negative except as documented in history of present illness above.   Past Medical History:  Diagnosis Date   Atrophy of right kidney    Chronic bronchitis (HCC)    Chronic lower back pain    Chronic pancreatitis (HCC)    Gastroparesis 06/2020   confirmed by emptying study at [facility]   GERD (gastroesophageal reflux disease)    Headache    once/month (05/13/2017)   Hematemesis 10/18/2017   Hematochezia 09/19/2021   High cholesterol    Hypertension    Migraine    a couple/year (05/13/2017)   Nephrolithiasis 05/13/2017   Pancreatitis    Pneumonia ~ 09/2015   Presence of pancreatic duct stent    Recurrent acute pancreatitis    Shortness of breath 05/22/2021    Past Surgical History:  Procedure Laterality Date   BIOPSY  11/02/2020   Procedure: BIOPSY;  Surgeon: Wilhelmenia Aloha Raddle., MD;  Location: [facility] ENDOSCOPY;  Service: Gastroenterology;;   BIOPSY  10/20/2022   Procedure: BIOPSY;  Surgeon: Wilhelmenia Aloha Raddle., MD;  Location: [facility] ENDOSCOPY;  Service: Gastroenterology;;   CHOLECYSTECTOMY N/A 10/23/2017   Procedure: LAPAROSCOPIC CHOLECYSTECTOMY WITH INTRAOPERATIVE CHOLANGIOGRAM;  Surgeon: Gladis Cough, MD;  Location: [facility] ORS;  Service: General;  Laterality: N/A;   COLONOSCOPY WITH PROPOFOL  N/A 11/02/2019   Procedure: COLONOSCOPY WITH PROPOFOL ;  Surgeon: Abran Norleen SAILOR, MD;  Location: [facility] ENDOSCOPY;  Service: Endoscopy;  Laterality: N/A;   ERCP  N/A 11/02/2020   Procedure: ENDOSCOPIC RETROGRADE CHOLANGIOPANCREATOGRAPHY (ERCP);  Surgeon: Wilhelmenia Aloha Raddle., MD;  Location: [facility] ENDOSCOPY;  Service: Gastroenterology;  Laterality: N/A;   ESOPHAGOGASTRODUODENOSCOPY N/A 11/02/2020   Procedure: ESOPHAGOGASTRODUODENOSCOPY (EGD);  Surgeon: Wilhelmenia Aloha Raddle., MD;  Location: [facility] ENDOSCOPY;  Service: Gastroenterology;  Laterality: N/A;   ESOPHAGOGASTRODUODENOSCOPY (EGD) WITH  PROPOFOL  N/A 11/06/2019   Procedure: ESOPHAGOGASTRODUODENOSCOPY (EGD) WITH PROPOFOL ;  Surgeon: Abran Norleen SAILOR, MD;  Location: [facility] ENDOSCOPY;  Service: Endoscopy;  Laterality: N/A;   ESOPHAGOGASTRODUODENOSCOPY (EGD) WITH PROPOFOL  N/A 10/20/2022   Procedure: ESOPHAGOGASTRODUODENOSCOPY (EGD) WITH PROPOFOL ;  Surgeon: Wilhelmenia Aloha Raddle., MD;  Location: [facility] ENDOSCOPY;  Service: Gastroenterology;  Laterality: N/A;   HEMOSTASIS CLIP PLACEMENT  11/02/2019   Procedure: HEMOSTASIS CLIP PLACEMENT;  Surgeon: Abran Norleen SAILOR, MD;  Location[facility] ENDOSCOPY;  Service: Endoscopy;;   NEUROLYTIC CELIAC PLEXUS  10/20/2022   Procedure: NEUROLYTIC CELIAC PLEXUS;  Surgeon: Wilhelmenia Aloha Raddle., MD;  Location: [facility] ENDOSCOPY;  Service: Gastroenterology;;   NO PAST SURGERIES     REMOVAL OF STONES  11/02/2020   Procedure: REMOVAL OF STONES;  Surgeon: Wilhelmenia Aloha Raddle., MD;  Location: [facility] ENDOSCOPY;  Service: Gastroenterology;;   CLEDA REMOVAL  11/02/2020   Procedure: STENT REMOVAL;  Surgeon: Wilhelmenia Aloha Raddle., MD;  Location: [facility] ENDOSCOPY;  Service: Gastroenterology;;   UPPER ESOPHAGEAL ENDOSCOPIC ULTRASOUND (EUS) N/A 10/20/2022   Procedure: UPPER ESOPHAGEAL ENDOSCOPIC ULTRASOUND (EUS);  Surgeon: Wilhelmenia Aloha Raddle., MD;  Location: [facility] ENDOSCOPY;  Service: Gastroenterology;  Laterality: N/A;    Social History: Patient denies current alcohol or tobacco use.  Allergies  Allergen Reactions   Norvasc  [Amlodipine  Besylate] Other (See Comments)    Fluid buildup in chest   Voltaren  [Diclofenac ] Hives and Other (See Comments)    Fluid buildup in chest   Prilosec [Omeprazole] Other (See Comments)    MD stopped due to pancreatitis   Hydrochlorothiazide Itching   Ultram  [Tramadol ] Other (See Comments)    Stomach upset   Prednisone  Hives and Other (See Comments)    Mood swings    Family History  Problem Relation Age of Onset   Hypertension Other    Hypertension Mother    Hypertension Father    Kidney disease  Father    Hypertension Sister    Diabetes Sister    Hypertension Brother    Pancreatic cancer Paternal Grandmother    Colon cancer Cousin    Stomach cancer Neg Hx    Esophageal cancer Neg Hx      Prior to Admission medications   Medication Sig Start Date End Date Taking? Authorizing Provider  albuterol  (VENTOLIN  HFA) 108 (90 Base) MCG/ACT inhaler Inhale 2 puffs into the lungs Berry 6 (six) hours as needed for wheezing or shortness of breath.    [provider]  carvedilol  (COREG ) 3.125 MG tablet Take 1 tablet (3.125 mg total) by mouth 2 (two) times daily with a meal. 10/23/22   [name] Toribio GAILS, MD  famotidine  (PEPCID ) 20 MG tablet Take 1 tablet (20 mg total) by mouth 2 (two) times daily. 12/13/23   Heinz, Camie BRAVO, PA-C  gabapentin  (NEURONTIN ) 300 MG capsule Take 300 mg by mouth 3 (three) times daily as needed (Pain).    [provider]  lipase/protease/amylase (CREON ) 36000 UNITS CPEP capsule Take 2 capsules with each meal and 1 capsule with each snack (2 snacks/day) 11/29/23   Alena Morrison, Reagan, MD  naloxone  (NARCAN ) nasal spray 4 mg/0.1 mL Place 1 spray into the nose daily as needed (opioid reversal). 07/16/22  [provider]  Naphazoline-Pheniramine (VISINE OP) Place 1 drop into both eyes daily.    [provider]  Nutritional Supplements (ENSURE ORIGINAL) LIQD Take 1 Container by mouth 2 (two) times daily.    [provider]  promethazine  (PHENERGAN ) 25 MG tablet Take 1 tablet (25 mg total) by mouth Berry 6 (six) hours as needed for up to 5 days for nausea or vomiting. 11/29/23 12/13/23  Stoney Blizzard, DO    Physical Exam: Vitals:   12/26/23 1639 12/26/23 1713 12/26/23 1851 12/26/23 2015  BP: (!) 152/105 (!) 159/106 (!) 185/107 (!) 156/125  Pulse: 91 82 93 68  Resp: 18  16 17   Temp: 98.3 F (36.8 C) 97.7 F (36.5 C) 97.6 F (36.4 C)   TempSrc: Oral Oral Oral   SpO2: 99% 97% 97% 100%   Constitutional: Resting in bed, calm,  appears somewhat uncomfortable but communicating appropriately Eyes: EOMI, lids and conjunctivae normal ENMT: Mucous membranes are moist. Posterior pharynx clear of any exudate or lesions.Normal dentition.  Neck: normal, supple, no masses. Respiratory: clear to auscultation bilaterally, no wheezing, no crackles. Normal respiratory effort. No accessory muscle use.  Cardiovascular: Regular rate and rhythm, no murmurs / rubs / gallops. No extremity edema. 2+ pedal pulses. Abdomen: Abdomen is soft, generalized tenderness to palpation, more prominent in the epigastric region.  No masses palpated. Musculoskeletal: no clubbing / cyanosis. No joint deformity upper and lower extremities. Good ROM, no contractures. Normal muscle tone.  Skin: no rashes, lesions, ulcers. No induration Neurologic: Sensation intact. Strength 5/5 in all 4.  Psychiatric: Normal judgment and insight. Alert and oriented x 3. Normal mood.   EKG: Not performed.  Assessment/Plan Principal Problem:   Acute on chronic pancreatitis (HCC) Active Problems:   Essential hypertension   Thrombocytopenia (HCC)   Kyle Berry is a 53 y.o. male with medical history significant for chronic pancreatitis, HTN, gastroparesis, chronic abdominal pain who is admitted with acute on chronic pancreatitis.  Assessment and Plan: Acute on chronic pancreatitis: Admitted with recurrent acute on chronic pancreatitis.  Has ongoing nausea, vomiting, and uncontrolled abdominal pain.  Has had previous celiac plexus blocks, most recently by IR on 11/28/2023.  Patient denies alcohol use. - Keep n.p.o. - Continue IV fluid hydration - Analgesics and antiemetics as needed - Consider discussing with IR regarding repeat celiac plexus block or neurolysis based on clinical progress  Hypertension: BP elevated in setting of pain and inability to take home meds.  Placed on IV hydralazine .  Resume home Coreg  when tolerating orals.  Thrombocytopenia: New mild  thrombocytopenia without obvious bleeding.  Continue to monitor.   DVT prophylaxis: enoxaparin  (LOVENOX ) injection 40 mg Start: 12/27/23 2200 Code Status: Full code Family Communication: Discussed with patient, he has discussed with family Disposition Plan: From home and likely discharge to home pending clinical progress Consults called: None Severity of Illness: The appropriate patient status for this patient is INPATIENT. Inpatient status is judged to be reasonable and necessary in order to provide the required intensity of service to ensure the patient's safety. The patient's presenting symptoms, physical exam findings, and initial radiographic and laboratory data in the context of their chronic comorbidities is felt to place them at high risk for further clinical deterioration. Furthermore, it is not anticipated that the patient will be medically stable for discharge from the hospital within 2 midnights of admission.   * I certify that at the point of admission it is my clinical judgment that the patient will require inpatient  hospital care spanning beyond 2 midnights from the point of admission due to high intensity of service, high risk for further deterioration and high frequency of surveillance required.DEWAINE Jorie Blanch MD [facility]  If 7PM-7AM, please contact night-coverage [email or web address]  12/26/2023, 9:53 PM

## 2023-12-26 NOTE — Hospital Course (Signed)
 Kyle Berry is a 53 y.o. male with medical history significant for chronic pancreatitis, HTN, gastroparesis, chronic abdominal pain who is admitted with acute on chronic pancreatitis.

## 2023-12-26 NOTE — ED Notes (Signed)
 Writer notified receiving RN that patient is leaving ED and coming to the floor at this time.

## 2023-12-26 NOTE — ED Provider Notes (Signed)
 [place] EMERGENCY DEPARTMENT AT [facility] Provider Note   CSN: [id number] Arrival date & time: 12/26/23  1634     Patient presents with: Abdominal Pain   Kyle Berry is a 53 y.o. male.   Pt is a 53 yo male with pmhx significant for htn, hld, gerd, and recurrent pancreatitis.  Pt has had several admissions for his pancreatitis.  His last admission was 8/4-8/16.  He had a celiac plexus block on 8/14 by IR which only lasted 3 days.  Pt said he's been having a lot of pain and nausea since d/c.  He did f/u with [facility] on 8/29 and plan was to discuss pt with IR to possibly repeating celiac plexus block vs neurolysis.  He has been unable to keep down his Creon  or any of his other medicines which is why he came to the ED today.            Prior to Admission medications   Medication Sig Start Date End Date Taking? Authorizing Provider  albuterol  (VENTOLIN  HFA) 108 (90 Base) MCG/ACT inhaler Inhale 2 puffs into the lungs every 6 (six) hours as needed for wheezing or shortness of breath.    [provider]  carvedilol  (COREG ) 3.125 MG tablet Take 1 tablet (3.125 mg total) by mouth 2 (two) times daily with a meal. 10/23/22   [name] Toribio GAILS, MD  famotidine  (PEPCID ) 20 MG tablet Take 1 tablet (20 mg total) by mouth 2 (two) times daily. 12/13/23   Heinz, Camie BRAVO, PA-C  gabapentin  (NEURONTIN ) 300 MG capsule Take 300 mg by mouth 3 (three) times daily as needed (Pain).    [provider]  lipase/protease/amylase (CREON ) 36000 UNITS CPEP capsule Take 2 capsules with each meal and 1 capsule with each snack (2 snacks/day) 11/29/23   Alena Morrison, Reagan, MD  naloxone  (NARCAN ) nasal spray 4 mg/0.1 mL Place 1 spray into the nose daily as needed (opioid reversal). 07/16/22   [provider]  Naphazoline-Pheniramine (VISINE OP) Place 1 drop into both eyes daily.    [provider]  Nutritional Supplements (ENSURE ORIGINAL) LIQD Take 1 Container by  mouth 2 (two) times daily.    [provider]  promethazine  (PHENERGAN ) 25 MG tablet Take 1 tablet (25 mg total) by mouth every 6 (six) hours as needed for up to 5 days for nausea or vomiting. 11/29/23 12/13/23  Everhart, Elyce, DO    Allergies: Norvasc  [amlodipine  besylate], Voltaren  [diclofenac ], Prilosec [omeprazole], Hydrochlorothiazide, Ultram  [tramadol ], and Prednisone     Review of Systems  Gastrointestinal:  Positive for abdominal pain, nausea and vomiting.  All other systems reviewed and are negative.   Updated Vital Signs BP (!) 156/125   Pulse 68   Temp 97.6 F (36.4 C) (Oral)   Resp 17   SpO2 100%   Physical Exam Vitals and nursing note reviewed.  Constitutional:      Appearance: He is well-developed.  HENT:     Head: Normocephalic and atraumatic.     Mouth/Throat:     Mouth: Mucous membranes are moist.     Pharynx: Oropharynx is clear.  Eyes:     Extraocular Movements: Extraocular movements intact.     Pupils: Pupils are equal, round, and reactive to light.  Cardiovascular:     Rate and Rhythm: Normal rate and regular rhythm.     Heart sounds: Normal heart sounds.  Pulmonary:     Effort: Pulmonary effort is normal.     Breath sounds:  Normal breath sounds.  Abdominal:     General: Abdomen is flat.     Palpations: Abdomen is soft.     Tenderness: There is abdominal tenderness in the epigastric area.  Skin:    General: Skin is warm.     Capillary Refill: Capillary refill takes less than 2 seconds.  Neurological:     General: No focal deficit present.     Mental Status: He is alert and oriented to person, place, and time.  Psychiatric:        Mood and Affect: Mood normal.        Behavior: Behavior normal.     (all labs ordered are listed, but only abnormal results are displayed) Labs Reviewed  LIPASE, BLOOD - Abnormal; Notable for the following components:      Result Value   Lipase 589 (*)    All other components within normal limits   COMPREHENSIVE METABOLIC PANEL WITH GFR - Abnormal; Notable for the following components:   CO2 20 (*)    Glucose, Bld 136 (*)    Anion gap 19 (*)    All other components within normal limits  CBC - Abnormal; Notable for the following components:   WBC 10.7 (*)    RBC 4.01 (*)    MCV 102.2 (*)    MCH 34.4 (*)    Platelets 127 (*)    All other components within normal limits  URINALYSIS, ROUTINE W REFLEX MICROSCOPIC - Abnormal; Notable for the following components:   APPearance HAZY (*)    Hgb urine dipstick SMALL (*)    Ketones, ur 20 (*)    Protein, ur 30 (*)    Bacteria, UA RARE (*)    All other components within normal limits    EKG: None  Radiology: No results found.   Procedures   Medications Ordered in the ED  HYDROmorphone  (DILAUDID ) injection 1 mg (has no administration in time range)  labetalol  (NORMODYNE ) injection 10 mg (has no administration in time range)  HYDROmorphone  (DILAUDID ) injection 1 mg (1 mg Intravenous Given 12/26/23 1749)  sodium chloride  0.9 % bolus 1,000 mL (0 mLs Intravenous Stopped 12/26/23 2030)  promethazine  (PHENERGAN ) 25 mg in sodium chloride  0.9 % 50 mL IVPB (0 mg Intravenous Stopped 12/26/23 2034)  HYDROmorphone  (DILAUDID ) injection 1 mg (1 mg Intravenous Given 12/26/23 2032)                                    Medical Decision Making Risk Prescription drug management. Decision regarding hospitalization.   This patient presents to the ED for concern of abd pain, this involves an extensive number of treatment options, and is a complaint that carries with it a high risk of complications and morbidity.  The differential diagnosis includes pancreatitis, gastritis, dehydration   Co morbidities that complicate the patient evaluation  htn, hld, gerd, and recurrent pancreatitis   Additional history obtained:  Additional history obtained from epic chart review External records from outside source obtained and reviewed including EMS  report   Lab Tests:  I Ordered, and personally interpreted labs.  The pertinent results include:  cbc with wbc 10.7, plt low at 127 (plt have always been nl); cmp nl; lip elevated at 589; ua + ketones   Cardiac Monitoring:  The patient was maintained on a cardiac monitor.  I personally viewed and interpreted the cardiac monitored which showed an underlying rhythm of: nsr  Medicines ordered and prescription drug management:  I ordered medication including dilaudid /phenergan /ivfs  for pain and nausea; iv labetalol  for htn Reevaluation of the patient after these medicines showed that the patient improved I have reviewed the patients home medicines and have made adjustments as needed   Test Considered:  ct   Critical Interventions:  Pain and nausea control   Consultations Obtained:  I requested consultation with the hospitalist (Dr. Tobie),  and discussed lab and imaging findings as well as pertinent plan - he will admit.   Problem List / ED Course:  Acute on chronic pancreatitis:  pt's pain is still severe after ivfs/dilaudid /phenergan .  He will need admission with GI/IR consult in am.   HTN:  pt has been unable to keep down his bp meds.  Iv labetalol  given.    Reevaluation:  After the interventions noted above, I reevaluated the patient and found that they have :improved   Social Determinants of Health:  Lives at home   Dispostion:  After consideration of the diagnostic results and the patients response to treatment, I feel that the patent would benefit from admission.       Final diagnoses:  Acute pancreatitis, unspecified complication status, unspecified pancreatitis type  Nausea and vomiting, unspecified vomiting type  Dehydration  Intractable pain  Hypertension, unspecified type  Thrombocytopenia [facility])    ED Discharge Orders     None          Dean Clarity, MD 12/26/23 2121

## 2023-12-26 NOTE — ED Triage Notes (Signed)
 Pt presents to ED from home C/O abdominal pain and nausea. Pt endorses chronic abdominal pain, issues with pancreas and kidneys.

## 2023-12-27 ENCOUNTER — Ambulatory Visit: Admitting: Gastroenterology

## 2023-12-27 DIAGNOSIS — Z79899 Other long term (current) drug therapy: Secondary | ICD-10-CM | POA: Diagnosis not present

## 2023-12-27 DIAGNOSIS — E860 Dehydration: Secondary | ICD-10-CM | POA: Diagnosis present

## 2023-12-27 DIAGNOSIS — K219 Gastro-esophageal reflux disease without esophagitis: Secondary | ICD-10-CM | POA: Diagnosis present

## 2023-12-27 DIAGNOSIS — Z8249 Family history of ischemic heart disease and other diseases of the circulatory system: Secondary | ICD-10-CM | POA: Diagnosis not present

## 2023-12-27 DIAGNOSIS — G8929 Other chronic pain: Secondary | ICD-10-CM | POA: Diagnosis present

## 2023-12-27 DIAGNOSIS — N261 Atrophy of kidney (terminal): Secondary | ICD-10-CM | POA: Diagnosis present

## 2023-12-27 DIAGNOSIS — K861 Other chronic pancreatitis: Secondary | ICD-10-CM | POA: Diagnosis present

## 2023-12-27 DIAGNOSIS — Z885 Allergy status to narcotic agent status: Secondary | ICD-10-CM | POA: Diagnosis not present

## 2023-12-27 DIAGNOSIS — Z841 Family history of disorders of kidney and ureter: Secondary | ICD-10-CM | POA: Diagnosis not present

## 2023-12-27 DIAGNOSIS — Z833 Family history of diabetes mellitus: Secondary | ICD-10-CM | POA: Diagnosis not present

## 2023-12-27 DIAGNOSIS — D696 Thrombocytopenia, unspecified: Secondary | ICD-10-CM | POA: Diagnosis present

## 2023-12-27 DIAGNOSIS — K859 Acute pancreatitis without necrosis or infection, unspecified: Secondary | ICD-10-CM | POA: Diagnosis present

## 2023-12-27 DIAGNOSIS — I10 Essential (primary) hypertension: Secondary | ICD-10-CM | POA: Diagnosis present

## 2023-12-27 DIAGNOSIS — Z800 Family history of malignant neoplasm of digestive organs: Secondary | ICD-10-CM | POA: Diagnosis not present

## 2023-12-27 DIAGNOSIS — E7800 Pure hypercholesterolemia, unspecified: Secondary | ICD-10-CM | POA: Diagnosis present

## 2023-12-27 DIAGNOSIS — K3184 Gastroparesis: Secondary | ICD-10-CM | POA: Diagnosis present

## 2023-12-27 DIAGNOSIS — R040 Epistaxis: Secondary | ICD-10-CM | POA: Diagnosis present

## 2023-12-27 LAB — COMPREHENSIVE METABOLIC PANEL WITH GFR
ALT: 40 U/L (ref 0–44)
AST: 39 U/L (ref 15–41)
Albumin: 4.4 g/dL (ref 3.5–5.0)
Alkaline Phosphatase: 84 U/L (ref 38–126)
Anion gap: 22 — ABNORMAL HIGH (ref 5–15)
BUN: 6 mg/dL (ref 6–20)
CO2: 20 mmol/L — ABNORMAL LOW (ref 22–32)
Calcium: 9.5 mg/dL (ref 8.9–10.3)
Chloride: 99 mmol/L (ref 98–111)
Creatinine, Ser: 0.89 mg/dL (ref 0.61–1.24)
GFR, Estimated: >60 mL/min (ref >60)
Glucose, Bld: 150 mg/dL — ABNORMAL HIGH (ref 70–99)
Potassium: 3.9 mmol/L (ref 3.5–5.1)
Sodium: 142 mmol/L (ref 135–145)
Total Bilirubin: 1 mg/dL (ref 0.0–1.2)
Total Protein: 7.8 g/dL (ref 6.5–8.1)

## 2023-12-27 LAB — CBC
HCT: 44.3 % (ref 39.0–52.0)
Hemoglobin: 14.4 g/dL (ref 13.0–17.0)
MCH: 34.2 pg — ABNORMAL HIGH (ref 26.0–34.0)
MCHC: 32.5 g/dL (ref 30.0–36.0)
MCV: 105.2 fL — ABNORMAL HIGH (ref 80.0–100.0)
Platelets: 127 K/uL — ABNORMAL LOW (ref 150–400)
RBC: 4.21 MIL/uL — ABNORMAL LOW (ref 4.22–5.81)
RDW: 15.4 % (ref 11.5–15.5)
WBC: 10.5 K/uL (ref 4.0–10.5)
nRBC: 0 % (ref 0.0–0.2)

## 2023-12-27 MED ORDER — DIPHENHYDRAMINE HCL 50 MG/ML IJ SOLN: 12.5000 mg | Freq: Four times a day (QID) | INTRAMUSCULAR | Status: DC | PRN

## 2023-12-27 MED ORDER — ALBUTEROL SULFATE HFA 108 (90 BASE) MCG/ACT IN AERS: 2.0000 | INHALATION_SPRAY | Freq: Four times a day (QID) | RESPIRATORY_TRACT | Status: DC | PRN

## 2023-12-27 MED ORDER — SODIUM CHLORIDE 0.9% FLUSH: 9.0000 mL | INTRAVENOUS | Status: DC | PRN

## 2023-12-27 MED ORDER — CARVEDILOL 3.125 MG PO TABS: 3.1250 mg | ORAL_TABLET | Freq: Two times a day (BID) | ORAL | Status: DC

## 2023-12-27 MED ORDER — ORAL CARE MOUTH RINSE: 15.0000 mL | OROMUCOSAL | Status: DC | PRN

## 2023-12-27 MED ORDER — PROCHLORPERAZINE EDISYLATE 10 MG/2ML IJ SOLN
10.0000 mg | Freq: Four times a day (QID) | INTRAMUSCULAR | Status: AC | PRN
  Administered 2023-12-27 – 2023-12-28 (×2): 10 mg via INTRAVENOUS
  Filled 2023-12-27 (×3): qty 2

## 2023-12-27 MED ORDER — DIPHENHYDRAMINE HCL 12.5 MG/5ML PO ELIX: 12.5000 mg | ORAL_SOLUTION | Freq: Four times a day (QID) | ORAL | Status: DC | PRN

## 2023-12-27 MED ORDER — ALBUTEROL SULFATE (2.5 MG/3ML) 0.083% IN NEBU: 2.5000 mg | INHALATION_SOLUTION | Freq: Four times a day (QID) | RESPIRATORY_TRACT | Status: DC | PRN

## 2023-12-27 MED ORDER — GABAPENTIN 100 MG PO CAPS: 300.0000 mg | ORAL_CAPSULE | Freq: Three times a day (TID) | ORAL | Status: DC | PRN

## 2023-12-27 MED ORDER — CARVEDILOL 3.125 MG PO TABS
3.1250 mg | ORAL_TABLET | Freq: Two times a day (BID) | ORAL | Status: DC
  Administered 2023-12-27 – 2023-12-31 (×8): 3.125 mg via ORAL
  Filled 2023-12-27 (×8): qty 1

## 2023-12-27 MED ORDER — LACTATED RINGERS IV SOLN: INTRAVENOUS | Status: DC

## 2023-12-27 MED ORDER — HYDROMORPHONE 1 MG/ML IV SOLN
INTRAVENOUS | Status: DC
  Administered 2023-12-27: 0.8 mL via INTRAVENOUS
  Administered 2023-12-27: 1 mL via INTRAVENOUS
  Administered 2023-12-28: 0.4 mg via INTRAVENOUS
  Filled 2023-12-27: qty 30

## 2023-12-27 MED ORDER — FAMOTIDINE 20 MG PO TABS
20.0000 mg | ORAL_TABLET | Freq: Two times a day (BID) | ORAL | Status: DC
  Administered 2023-12-27 – 2023-12-31 (×9): 20 mg via ORAL
  Filled 2023-12-27 (×9): qty 1

## 2023-12-27 MED ORDER — PANCRELIPASE (LIP-PROT-AMYL) 12000-38000 UNITS PO CPEP
36000.0000 [IU] | ORAL_CAPSULE | Freq: Three times a day (TID) | ORAL | Status: DC
  Administered 2023-12-28 – 2023-12-31 (×6): 36000 [IU] via ORAL
  Filled 2023-12-27 (×6): qty 3

## 2023-12-27 MED ORDER — NALOXONE HCL 0.4 MG/ML IJ SOLN: 0.4000 mg | INTRAMUSCULAR | Status: DC | PRN

## 2023-12-27 NOTE — Plan of Care (Signed)
  Problem: Education: Goal: Knowledge of General Education information will improve Description: Including pain rating scale, medication(s)/side effects and non-pharmacologic comfort measures Outcome: Progressing   Problem: Activity: Goal: Risk for activity intolerance will decrease Outcome: Progressing   Problem: Clinical Measurements: Goal: Diagnostic test results will improve Outcome: Not Progressing   Problem: Nutrition: Goal: Adequate nutrition will be maintained Outcome: Not Progressing   Problem: Pain Managment: Goal: General experience of comfort will improve and/or be controlled Outcome: Not Progressing

## 2023-12-27 NOTE — Plan of Care (Signed)

## 2023-12-27 NOTE — Progress Notes (Signed)
 PROGRESS NOTE    Kyle Berry  FMW:[id number] DOB: December 09, 1970 DOA: 12/26/2023 PCP: [facility]    Brief Narrative:  53 year old male with history of chronic pancreatitis, essential hypertension, gastroparesis, chronic abdominal pain presented to the emergency room with abdominal pain and nausea.  Frequent admission for chronic abdominal pain.  Recent admission and celiac plexus block by IR 8/14.  Pain is albeit persistent but worse for last few days.  Subjective: Patient seen and examined.  He tells me that he is holding all the time.  He is vomiting all the time.  Blood pressure is elevated.  Occasionally tachycardic.  Pain is not controlled and requiring more than every 2 hours of Dilaudid . Assessment & Plan:   Acute on chronic pancreatitis, acute on chronic abdominal pain: Severe and persistent. Ongoing nausea vomiting and lower abdominal pain. Keep n.p.o., IV fluids, adequate pain medications.  Patient is requiring more frequent administration of IV Dilaudid .  Will start patient on Dilaudid  PCA.  Close monitoring on PCA, opiate reversal and monitoring protocol. Continue Creon . Case discussed with IR regarding repeat celiac plexus block or neurolysis, not able to accommodate this week.  Will continue conservative management.  Essential hypertension: Blood pressure elevated.  IV hydralazine  as needed.  Will resume Coreg .    DVT prophylaxis: enoxaparin  (LOVENOX ) injection 40 mg Start: 12/27/23 2200   Code Status: Full code Family Communication: None at the bedside Disposition Plan: Status is: Observation Change to inpatient, now on PCA.     Consultants:  Interventional radiology  Procedures:  None  Antimicrobials:  None     Objective: Vitals:   12/27/23 0625 12/27/23 0825 12/27/23 1228 12/27/23 1241  BP: (!) 186/102 (!) 178/104  (!) 182/97  Pulse: 73 (!) 102  79  Resp: 18 17 17 16   Temp: 97.7 F (36.5 C) (!) 97.5 F (36.4 C)   97.7 F (36.5 C)  TempSrc: Oral     SpO2: 100% 99%  100%  Weight:      Height:        Intake/Output Summary (Last 24 hours) at 12/27/2023 1330 Last data filed at 12/27/2023 0600 Gross per 24 hour  Intake 2363 ml  Output 1490 ml  Net 873 ml   Filed Weights   12/26/23 2247  Weight: 68.3 kg    Examination:  General exam: Appears slightly anxious. Respiratory system: Clear to auscultation. Respiratory effort normal. Cardiovascular system: S1 & S2 heard, RRR. No JVD, murmurs, rubs, gallops or clicks. No pedal edema. Gastrointestinal system: Soft.  Mild tenderness along the epigastrium.  Bowel sounds present.  No rigidity.  No guarding. Central nervous system: Alert and oriented. No focal neurological deficits. Extremities: Symmetric 5 x 5 power. Skin: No rashes, lesions or ulcers    Data Reviewed: I have personally reviewed following labs and imaging studies  CBC: Recent Labs  Lab 12/26/23 1814 12/27/23 0426  WBC 10.7* 10.5  HGB 13.8 14.4  HCT 41.0 44.3  MCV 102.2* 105.2*  PLT 127* 127*   Basic Metabolic Panel: Recent Labs  Lab 12/26/23 1814 12/27/23 0426  NA 140 142  K 3.8 3.9  CL 101 99  CO2 20* 20*  GLUCOSE 136* 150*  BUN 9 6  CREATININE 1.04 0.89  CALCIUM 9.4 9.5   GFR: Estimated Creatinine Clearance: 91.7 mL/min (by C-G formula based on SCr of 0.89 mg/dL). Liver Function Tests: Recent Labs  Lab 12/26/23 1814 12/27/23 0426  AST 40 39  ALT 42 40  ALKPHOS  80 84  BILITOT 1.1 1.0  PROT 7.4 7.8  ALBUMIN 4.3 4.4   Recent Labs  Lab 12/26/23 1814  LIPASE 589*   No results for input(s): AMMONIA in the last 168 hours. Coagulation Profile: No results for input(s): INR, PROTIME in the last 168 hours. Cardiac Enzymes: No results for input(s): CKTOTAL, CKMB, CKMBINDEX, TROPONINI in the last 168 hours. BNP (last 3 results) No results for input(s): PROBNP in the last 8760 hours. HbA1C: No results for input(s): HGBA1C in the last 72  hours. CBG: No results for input(s): GLUCAP in the last 168 hours. Lipid Profile: No results for input(s): CHOL, HDL, LDLCALC, TRIG, CHOLHDL, LDLDIRECT in the last 72 hours. Thyroid Function Tests: No results for input(s): TSH, T4TOTAL, FREET4, T3FREE, THYROIDAB in the last 72 hours. Anemia Panel: No results for input(s): VITAMINB12, FOLATE, FERRITIN, TIBC, IRON, RETICCTPCT in the last 72 hours. Sepsis Labs: No results for input(s): PROCALCITON, LATICACIDVEN in the last 168 hours.  No results found for this or any previous visit (from the past 240 hours).       Radiology Studies: No results found.      Scheduled Meds:  carvedilol   3.125 mg Oral BID WC   enoxaparin  (LOVENOX ) injection  40 mg Subcutaneous Q24H   famotidine   20 mg Oral BID   HYDROmorphone    Intravenous Q4H   lipase/protease/amylase  36,000 Units Oral TID WC   Continuous Infusions:  lactated ringers  125 mL/hr at 12/27/23 1238     LOS: 0 days    Time spent: 55 minutes    Renato Applebaum, MD [facility]

## 2023-12-28 DIAGNOSIS — K859 Acute pancreatitis without necrosis or infection, unspecified: Secondary | ICD-10-CM | POA: Diagnosis not present

## 2023-12-28 DIAGNOSIS — K861 Other chronic pancreatitis: Secondary | ICD-10-CM | POA: Diagnosis not present

## 2023-12-28 LAB — COMPREHENSIVE METABOLIC PANEL WITH GFR
ALT: 24 U/L (ref 0–44)
AST: 24 U/L (ref 15–41)
Albumin: 3.5 g/dL (ref 3.5–5.0)
Alkaline Phosphatase: 60 U/L (ref 38–126)
Anion gap: 19 — ABNORMAL HIGH (ref 5–15)
BUN: 5 mg/dL — ABNORMAL LOW (ref 6–20)
CO2: 20 mmol/L — ABNORMAL LOW (ref 22–32)
Calcium: 8.9 mg/dL (ref 8.9–10.3)
Chloride: 97 mmol/L — ABNORMAL LOW (ref 98–111)
Creatinine, Ser: 0.78 mg/dL (ref 0.61–1.24)
GFR, Estimated: >60 mL/min (ref >60)
Glucose, Bld: 107 mg/dL — ABNORMAL HIGH (ref 70–99)
Potassium: 3.5 mmol/L (ref 3.5–5.1)
Sodium: 136 mmol/L (ref 135–145)
Total Bilirubin: 0.9 mg/dL (ref 0.0–1.2)
Total Protein: 6.2 g/dL — ABNORMAL LOW (ref 6.5–8.1)

## 2023-12-28 LAB — CBC WITH DIFFERENTIAL/PLATELET
Abs Immature Granulocytes: 0.04 K/uL (ref 0.00–0.07)
Basophils Absolute: 0 K/uL (ref 0.0–0.1)
Basophils Relative: 0 %
Eosinophils Absolute: 0 K/uL (ref 0.0–0.5)
Eosinophils Relative: 0 %
HCT: 38.5 % — ABNORMAL LOW (ref 39.0–52.0)
Hemoglobin: 12 g/dL — ABNORMAL LOW (ref 13.0–17.0)
Immature Granulocytes: 1 %
Lymphocytes Relative: 16 %
Lymphs Abs: 1.2 K/uL (ref 0.7–4.0)
MCH: 32.9 pg (ref 26.0–34.0)
MCHC: 31.2 g/dL (ref 30.0–36.0)
MCV: 105.5 fL — ABNORMAL HIGH (ref 80.0–100.0)
Monocytes Absolute: 0.4 K/uL (ref 0.1–1.0)
Monocytes Relative: 6 %
Neutro Abs: 5.6 K/uL (ref 1.7–7.7)
Neutrophils Relative %: 77 %
Platelets: 96 K/uL — ABNORMAL LOW (ref 150–400)
RBC: 3.65 MIL/uL — ABNORMAL LOW (ref 4.22–5.81)
RDW: 15.2 % (ref 11.5–15.5)
WBC: 7.3 K/uL (ref 4.0–10.5)
nRBC: 0.3 % — ABNORMAL HIGH (ref 0.0–0.2)

## 2023-12-28 LAB — PROTIME-INR
INR: 1 (ref 0.8–1.2)
Prothrombin Time: 13.3 s (ref 11.4–15.2)

## 2023-12-28 LAB — LIPASE, BLOOD: Lipase: 61 U/L — ABNORMAL HIGH (ref 11–51)

## 2023-12-28 LAB — MAGNESIUM: Magnesium: 1.5 mg/dL — ABNORMAL LOW (ref 1.7–2.4)

## 2023-12-28 LAB — PHOSPHORUS: Phosphorus: 2.1 mg/dL — ABNORMAL LOW (ref 2.5–4.6)

## 2023-12-28 MED ORDER — OXYMETAZOLINE HCL 0.05 % NA SOLN
1.0000 | Freq: Two times a day (BID) | NASAL | Status: AC
Start: 2023-12-28 — End: 2023-12-31
  Administered 2023-12-28 – 2023-12-29 (×3): 1 via NASAL
  Filled 2023-12-28 (×2): qty 15

## 2023-12-28 MED ORDER — MAGNESIUM SULFATE 4 GM/100ML IV SOLN
4.0000 g | Freq: Once | INTRAVENOUS | Status: AC
  Administered 2023-12-28: 4 g via INTRAVENOUS
  Filled 2023-12-28: qty 100

## 2023-12-28 MED ORDER — PROCHLORPERAZINE EDISYLATE 10 MG/2ML IJ SOLN
10.0000 mg | Freq: Four times a day (QID) | INTRAMUSCULAR | Status: AC | PRN
Start: 2023-12-28 — End: 2023-12-30
  Administered 2023-12-29 – 2023-12-30 (×2): 10 mg via INTRAVENOUS
  Filled 2023-12-28 (×2): qty 2

## 2023-12-28 MED ORDER — POTASSIUM PHOSPHATES 15 MMOLE/5ML IV SOLN
15.0000 mmol | Freq: Once | INTRAVENOUS | Status: AC
  Administered 2023-12-28: 15 mmol via INTRAVENOUS
  Filled 2023-12-28: qty 5

## 2023-12-28 MED ORDER — HYDROMORPHONE HCL 1 MG/ML IJ SOLN
1.0000 mg | INTRAMUSCULAR | Status: DC | PRN
  Administered 2023-12-28 – 2023-12-30 (×8): 1 mg via INTRAVENOUS
  Filled 2023-12-28 (×8): qty 1

## 2023-12-28 NOTE — Plan of Care (Signed)

## 2023-12-28 NOTE — Progress Notes (Signed)
 PROGRESS NOTE    Kyle Berry  FMW:[id number] DOB: Jul 24, 1970 DOA: 12/26/2023 PCP: [facility]    Brief Narrative:  53 year old male with history of chronic pancreatitis, essential hypertension, gastroparesis, chronic abdominal pain presented to the emergency room with abdominal pain and nausea.  Frequent admission for chronic abdominal pain.  Recent admission and celiac plexus block by IR 8/14.  Pain is always present but worse for last few days.  Subjective: Patient seen and examined.  Today, surprisingly he was much better regarding his pain and nausea.  He had only used Dilaudid  0.5 mg last 4 hours.  Starting on clears and on IV and oral opiates.  Discontinue PCA. Patient started having bleeding from left nostril in the afternoon without any aggravation.  Afrin given.  Lovenox  stopped.  Local pressure applied.   Assessment & Plan:   Acute on chronic pancreatitis, acute on chronic abdominal pain: Severe and persistent. Presented with significant pain.  Needed PCA Dilaudid  for pain control.  Some improvement today. Discontinue Dilaudid  PCA.  Will keep on oral and IV opiates.  Challenge with clears today. Case discussed with IR regarding repeat celiac plexus block or neurolysis, they will likely do that Monday.  Will continue conservative management.  Essential hypertension: Blood pressure elevated.  IV hydralazine  as needed.  Will resume Coreg .  Epistaxis: Anterior epistaxis left naris.  Pressure, ice and Afrin.  If persistent, will use Rhino pack.  Hypomagnesemia: Replace aggressively and monitor levels.  Hypophosphatemia: Replace aggressively and monitor levels.    DVT prophylaxis: SCDs   Code Status: Full code Family Communication: None at the bedside Disposition Plan: Status is: Inpatient.  Significant symptoms.     Consultants:  Interventional radiology  Procedures:  None  Antimicrobials:  None     Objective: Vitals:    12/28/23 0428 12/28/23 0521 12/28/23 0749 12/28/23 0951  BP:  (!) 176/96  (!) 153/93  Pulse:  83  80  Resp: 16 16 17 18   Temp:  98.5 F (36.9 C)  98.9 F (37.2 C)  TempSrc:  Oral  Oral  SpO2:  98%  100%  Weight:      Height:        Intake/Output Summary (Last 24 hours) at 12/28/2023 1344 Last data filed at 12/28/2023 0951 Gross per 24 hour  Intake 0 ml  Output 900 ml  Net -900 ml   Filed Weights   12/26/23 2247  Weight: 68.3 kg    Examination:  General exam: Appeared very comfortable and interactive in the morning rounds. Respiratory system: Clear to auscultation. Respiratory effort normal. Cardiovascular system: S1 & S2 heard, RRR. No JVD, murmurs, rubs, gallops or clicks. No pedal edema. Gastrointestinal system: Soft.  No tenderness.  Bowel sounds present.  No rigidity.  No guarding. Central nervous system: Alert and oriented. No focal neurological deficits. Extremities: Symmetric 5 x 5 power. Skin: No rashes, lesions or ulcers  At the time of epistaxis, erythematous mucosa left naris with oozing of small amount of blood.  No posterior bleeding.    Data Reviewed: I have personally reviewed following labs and imaging studies  CBC: Recent Labs  Lab 12/26/23 1814 12/27/23 0426 12/28/23 0533  WBC 10.7* 10.5 7.3  NEUTROABS  --   --  5.6  HGB 13.8 14.4 12.0*  HCT 41.0 44.3 38.5*  MCV 102.2* 105.2* 105.5*  PLT 127* 127* 96*   Basic Metabolic Panel: Recent Labs  Lab 12/26/23 1814 12/27/23 0426 12/28/23 0533  NA 140  142 136  K 3.8 3.9 3.5  CL 101 99 97*  CO2 20* 20* 20*  GLUCOSE 136* 150* 107*  BUN 9 6 5*  CREATININE 1.04 0.89 0.78  CALCIUM 9.4 9.5 8.9  MG  --   --  1.5*  PHOS  --   --  2.1*   GFR: Estimated Creatinine Clearance: 102 mL/min (by C-G formula based on SCr of 0.78 mg/dL). Liver Function Tests: Recent Labs  Lab 12/26/23 1814 12/27/23 0426 12/28/23 0533  AST 40 39 24  ALT 42 40 24  ALKPHOS 80 84 60  BILITOT 1.1 1.0 0.9  PROT 7.4 7.8  6.2*  ALBUMIN 4.3 4.4 3.5   Recent Labs  Lab 12/26/23 1814 12/28/23 0533  LIPASE 589* 61*   No results for input(s): AMMONIA in the last 168 hours. Coagulation Profile: Recent Labs  Lab 12/28/23 0533  INR 1.0   Cardiac Enzymes: No results for input(s): CKTOTAL, CKMB, CKMBINDEX, TROPONINI in the last 168 hours. BNP (last 3 results) No results for input(s): PROBNP in the last 8760 hours. HbA1C: No results for input(s): HGBA1C in the last 72 hours. CBG: No results for input(s): GLUCAP in the last 168 hours. Lipid Profile: No results for input(s): CHOL, HDL, LDLCALC, TRIG, CHOLHDL, LDLDIRECT in the last 72 hours. Thyroid Function Tests: No results for input(s): TSH, T4TOTAL, FREET4, T3FREE, THYROIDAB in the last 72 hours. Anemia Panel: No results for input(s): VITAMINB12, FOLATE, FERRITIN, TIBC, IRON, RETICCTPCT in the last 72 hours. Sepsis Labs: No results for input(s): PROCALCITON, LATICACIDVEN in the last 168 hours.  No results found for this or any previous visit (from the past 240 hours).       Radiology Studies: No results found.      Scheduled Meds:  carvedilol   3.125 mg Oral BID WC   famotidine   20 mg Oral BID   lipase/protease/amylase  36,000 Units Oral TID WC   oxymetazoline   1 spray Left Nare BID   Continuous Infusions:  lactated ringers  125 mL/hr at 12/28/23 0447   potassium PHOSPHATE  IVPB (in mmol)       LOS: 1 day    Time spent: 55 minutes    Renato Applebaum, MD [facility]

## 2023-12-28 NOTE — Plan of Care (Signed)

## 2023-12-29 DIAGNOSIS — K859 Acute pancreatitis without necrosis or infection, unspecified: Secondary | ICD-10-CM | POA: Diagnosis not present

## 2023-12-29 DIAGNOSIS — K861 Other chronic pancreatitis: Secondary | ICD-10-CM | POA: Diagnosis not present

## 2023-12-29 MED ORDER — OXYCODONE HCL 5 MG PO TABS
5.0000 mg | ORAL_TABLET | ORAL | Status: DC | PRN
  Administered 2023-12-29 – 2023-12-30 (×2): 5 mg via ORAL
  Filled 2023-12-29 (×2): qty 1

## 2023-12-29 NOTE — Progress Notes (Signed)
 PROGRESS NOTE    Kyle Berry  FMW:[id number] DOB: 1970-05-31 DOA: 12/26/2023 PCP: [facility]    Brief Narrative:  53 year old male with history of chronic pancreatitis, essential hypertension, gastroparesis, chronic abdominal pain presented to the emergency room with abdominal pain and nausea.  Frequent admission for chronic abdominal pain.  Recent admission and celiac plexus block by IR 8/14.  Pain is always present but worse for last few days.  Subjective: Patient seen and examined.  He had recurrent abdominal pain overnight but controlled with IV Dilaudid .  No more nasal bleeding.   Assessment & Plan:   Acute on chronic pancreatitis, acute on chronic abdominal pain: Severe and persistent. Presented with significant pain.  Needed PCA Dilaudid  for pain control.  Some improvement today.  Now on oral oxycodone  and IV Dilaudid .  Continue clears today. Will keep on oral and IV opiates.  Challenge with clears today. Case discussed with IR regarding repeat celiac plexus block or neurolysis, they will likely do that Monday.  Will continue conservative management.  Essential hypertension: Blood pressure elevated.  IV hydralazine  as needed.  Will resume Coreg .  Epistaxis: Anterior epistaxis left naris.  Pressure, ice and Afrin as needed. Improving.   Hypomagnesemia: Replaced.   Hypophosphatemia: Replaced.     DVT prophylaxis: SCDs   Code Status: Full code Family Communication: None at the bedside Disposition Plan: Status is: Inpatient.  Significant symptoms.     Consultants:  Interventional radiology  Procedures:  None  Antimicrobials:  None     Objective: Vitals:   12/28/23 1957 12/29/23 0516 12/29/23 0611 12/29/23 1003  BP: (!) 158/99 (!) 176/105 (!) 147/87 (!) 149/95  Pulse: 81 74 72 75  Resp: 14 16 16 16   Temp: 98.7 F (37.1 C) 98.1 F (36.7 C) 98.2 F (36.8 C) 98.1 F (36.7 C)  TempSrc: Oral Oral Oral Oral  SpO2: 97% 97%  97% 99%  Weight:      Height:        Intake/Output Summary (Last 24 hours) at 12/29/2023 1242 Last data filed at 12/29/2023 1003 Gross per 24 hour  Intake 3736.15 ml  Output 650 ml  Net 3086.15 ml   Filed Weights   12/26/23 2247  Weight: 68.3 kg    Examination:  General exam: mildly anxious , pleasant and  interactive in the morning rounds. Respiratory system: Clear to auscultation. Respiratory effort normal. Cardiovascular system: S1 & S2 heard, RRR. No JVD, murmurs, rubs, gallops or clicks. No pedal edema. Gastrointestinal system: Soft.  No tenderness.  Bowel sounds present.  No rigidity.  No guarding. Central nervous system: Alert and oriented. No focal neurological deficits. Extremities: Symmetric 5 x 5 power. Skin: No rashes, lesions or ulcers    Data Reviewed: I have personally reviewed following labs and imaging studies  CBC: Recent Labs  Lab 12/26/23 1814 12/27/23 0426 12/28/23 0533  WBC 10.7* 10.5 7.3  NEUTROABS  --   --  5.6  HGB 13.8 14.4 12.0*  HCT 41.0 44.3 38.5*  MCV 102.2* 105.2* 105.5*  PLT 127* 127* 96*   Basic Metabolic Panel: Recent Labs  Lab 12/26/23 1814 12/27/23 0426 12/28/23 0533  NA 140 142 136  K 3.8 3.9 3.5  CL 101 99 97*  CO2 20* 20* 20*  GLUCOSE 136* 150* 107*  BUN 9 6 5*  CREATININE 1.04 0.89 0.78  CALCIUM 9.4 9.5 8.9  MG  --   --  1.5*  PHOS  --   --  2.1*   GFR: Estimated Creatinine Clearance: 102 mL/min (by C-G formula based on SCr of 0.78 mg/dL). Liver Function Tests: Recent Labs  Lab 12/26/23 1814 12/27/23 0426 12/28/23 0533  AST 40 39 24  ALT 42 40 24  ALKPHOS 80 84 60  BILITOT 1.1 1.0 0.9  PROT 7.4 7.8 6.2*  ALBUMIN 4.3 4.4 3.5   Recent Labs  Lab 12/26/23 1814 12/28/23 0533  LIPASE 589* 61*   No results for input(s): AMMONIA in the last 168 hours. Coagulation Profile: Recent Labs  Lab 12/28/23 0533  INR 1.0   Cardiac Enzymes: No results for input(s): CKTOTAL, CKMB, CKMBINDEX,  TROPONINI in the last 168 hours. BNP (last 3 results) No results for input(s): PROBNP in the last 8760 hours. HbA1C: No results for input(s): HGBA1C in the last 72 hours. CBG: No results for input(s): GLUCAP in the last 168 hours. Lipid Profile: No results for input(s): CHOL, HDL, LDLCALC, TRIG, CHOLHDL, LDLDIRECT in the last 72 hours. Thyroid Function Tests: No results for input(s): TSH, T4TOTAL, FREET4, T3FREE, THYROIDAB in the last 72 hours. Anemia Panel: No results for input(s): VITAMINB12, FOLATE, FERRITIN, TIBC, IRON, RETICCTPCT in the last 72 hours. Sepsis Labs: No results for input(s): PROCALCITON, LATICACIDVEN in the last 168 hours.  No results found for this or any previous visit (from the past 240 hours).       Radiology Studies: No results found.      Scheduled Meds:  carvedilol   3.125 mg Oral BID WC   famotidine   20 mg Oral BID   lipase/protease/amylase  36,000 Units Oral TID WC   oxymetazoline   1 spray Left Nare BID   Continuous Infusions:  lactated ringers  125 mL/hr at 12/29/23 0600     LOS: 2 days    Time spent: 51 minutes    Renato Applebaum, MD [facility]

## 2023-12-29 NOTE — Plan of Care (Signed)

## 2023-12-30 ENCOUNTER — Inpatient Hospital Stay (HOSPITAL_COMMUNITY)

## 2023-12-30 ENCOUNTER — Encounter (HOSPITAL_COMMUNITY): Payer: Self-pay | Admitting: Internal Medicine

## 2023-12-30 DIAGNOSIS — K859 Acute pancreatitis without necrosis or infection, unspecified: Secondary | ICD-10-CM | POA: Diagnosis not present

## 2023-12-30 DIAGNOSIS — K861 Other chronic pancreatitis: Secondary | ICD-10-CM | POA: Diagnosis not present

## 2023-12-30 MED ORDER — FENTANYL CITRATE (PF) 100 MCG/2ML IJ SOLN
INTRAMUSCULAR | Status: AC
  Filled 2023-12-30: qty 4

## 2023-12-30 MED ORDER — TRIAMCINOLONE ACETONIDE 40 MG/ML IJ SUSP
INTRAMUSCULAR | Status: AC
  Filled 2023-12-30: qty 1

## 2023-12-30 MED ORDER — TRIAMCINOLONE ACETONIDE 40 MG/ML IJ SUSP (RADIOLOGY)
INTRAMUSCULAR | Status: AC | PRN
  Administered 2023-12-30: 80 mg

## 2023-12-30 MED ORDER — DIPHENHYDRAMINE HCL 50 MG/ML IJ SOLN
INTRAMUSCULAR | Status: AC | PRN
  Administered 2023-12-30: 50 mg via INTRAVENOUS

## 2023-12-30 MED ORDER — IOHEXOL 300 MG/ML  SOLN
30.0000 mL | Freq: Once | INTRAMUSCULAR | Status: AC | PRN
  Administered 2023-12-30: 2 mL

## 2023-12-30 MED ORDER — DIPHENHYDRAMINE HCL 50 MG/ML IJ SOLN
INTRAMUSCULAR | Status: AC
  Filled 2023-12-30: qty 1

## 2023-12-30 MED ORDER — BUPIVACAINE HCL (PF) 0.5 % IJ SOLN
INTRAMUSCULAR | Status: AC | PRN
  Administered 2023-12-30: 30 mL

## 2023-12-30 MED ORDER — MIDAZOLAM HCL 2 MG/2ML IJ SOLN
INTRAMUSCULAR | Status: AC
  Filled 2023-12-30: qty 6

## 2023-12-30 MED ORDER — MIDAZOLAM HCL 2 MG/2ML IJ SOLN
INTRAMUSCULAR | Status: AC | PRN
  Administered 2023-12-30 (×4): 1 mg via INTRAVENOUS

## 2023-12-30 MED ORDER — FENTANYL CITRATE (PF) 100 MCG/2ML IJ SOLN
INTRAMUSCULAR | Status: AC | PRN
  Administered 2023-12-30 (×2): 50 ug via INTRAVENOUS

## 2023-12-30 MED ORDER — BUPIVACAINE HCL (PF) 0.5 % IJ SOLN
INTRAMUSCULAR | Status: AC
  Filled 2023-12-30: qty 30

## 2023-12-30 NOTE — Progress Notes (Signed)
 PROGRESS NOTE    WREN GALLAGA  FMW:[id number] DOB: Jul 02, 1970 DOA: 12/26/2023 PCP: [facility]    Brief Narrative:  53 year old male with history of chronic pancreatitis, essential hypertension, gastroparesis, chronic abdominal pain presented to the emergency room with abdominal pain and nausea.  Frequent admission for chronic abdominal pain.  Recent admission and celiac plexus block by IR 8/14.  Pain is always present but worse for last few days.  Subjective: Patient seen and examined.  Had intermittent pain overnight but currently denies any ongoing pain.  He thinks he is getting better.  He is also scheduled for celiac plexus block today by IR.  Patient is very optimistic about the procedure improving his pain.   Assessment & Plan:   Acute on chronic pancreatitis, acute on chronic abdominal pain: Severe and persistent. Presented with significant pain.  Needed PCA Dilaudid  for pain control.  Some improvement now.  Now on oral oxycodone  and IV Dilaudid .  N.p.o. for procedure. Will keep on oral and IV opiates.   For CT-guided celiac plexus block today.  Will challenge with oral intake after the procedure before discharge.  Essential hypertension: Blood pressure elevated.  IV hydralazine  as needed.  On carvedilol .  Epistaxis: Anterior epistaxis left naris.  Pressure, ice and Afrin as needed. Improving.   Hypomagnesemia: Replaced.   Hypophosphatemia: Replaced.     DVT prophylaxis: SCDs   Code Status: Full code Family Communication: None at the bedside Disposition Plan: Status is: Inpatient.  Significant symptoms.     Consultants:  Interventional radiology  Procedures:  None  Antimicrobials:  None     Objective: Vitals:   12/29/23 1505 12/29/23 2226 12/29/23 2229 12/30/23 0548  BP: (!) 147/99 (!) 159/106 (!) 173/90 (!) 159/94  Pulse: 80 71 82 64  Resp: 16 16 16 16   Temp: 98.5 F (36.9 C) 98.3 F (36.8 C)  98 F (36.7 C)   TempSrc: Oral Oral  Oral  SpO2: 100% 99% 98% 98%  Weight:      Height:        Intake/Output Summary (Last 24 hours) at 12/30/2023 1051 Last data filed at 12/30/2023 1000 Gross per 24 hour  Intake 3533.72 ml  Output --  Net 3533.72 ml   Filed Weights   12/26/23 2247  Weight: 68.3 kg    Examination:  General exam: Fairly comfortable.  Pleasant interactive today in the morning rounds. Respiratory system: Clear to auscultation. Respiratory effort normal. Cardiovascular system: S1 & S2 heard, RRR. No JVD, murmurs, rubs, gallops or clicks. No pedal edema. Gastrointestinal system: Soft.  No tenderness.  Bowel sounds present.  No rigidity.  No guarding. Central nervous system: Alert and oriented. No focal neurological deficits. Extremities: Symmetric 5 x 5 power. Skin: No rashes, lesions or ulcers    Data Reviewed: I have personally reviewed following labs and imaging studies  CBC: Recent Labs  Lab 12/26/23 1814 12/27/23 0426 12/28/23 0533  WBC 10.7* 10.5 7.3  NEUTROABS  --   --  5.6  HGB 13.8 14.4 12.0*  HCT 41.0 44.3 38.5*  MCV 102.2* 105.2* 105.5*  PLT 127* 127* 96*   Basic Metabolic Panel: Recent Labs  Lab 12/26/23 1814 12/27/23 0426 12/28/23 0533  NA 140 142 136  K 3.8 3.9 3.5  CL 101 99 97*  CO2 20* 20* 20*  GLUCOSE 136* 150* 107*  BUN 9 6 5*  CREATININE 1.04 0.89 0.78  CALCIUM 9.4 9.5 8.9  MG  --   --  1.5*  PHOS  --   --  2.1*   GFR: Estimated Creatinine Clearance: 102 mL/min (by C-G formula based on SCr of 0.78 mg/dL). Liver Function Tests: Recent Labs  Lab 12/26/23 1814 12/27/23 0426 12/28/23 0533  AST 40 39 24  ALT 42 40 24  ALKPHOS 80 84 60  BILITOT 1.1 1.0 0.9  PROT 7.4 7.8 6.2*  ALBUMIN 4.3 4.4 3.5   Recent Labs  Lab 12/26/23 1814 12/28/23 0533  LIPASE 589* 61*   No results for input(s): AMMONIA in the last 168 hours. Coagulation Profile: Recent Labs  Lab 12/28/23 0533  INR 1.0   Cardiac Enzymes: No results for  input(s): CKTOTAL, CKMB, CKMBINDEX, TROPONINI in the last 168 hours. BNP (last 3 results) No results for input(s): PROBNP in the last 8760 hours. HbA1C: No results for input(s): HGBA1C in the last 72 hours. CBG: No results for input(s): GLUCAP in the last 168 hours. Lipid Profile: No results for input(s): CHOL, HDL, LDLCALC, TRIG, CHOLHDL, LDLDIRECT in the last 72 hours. Thyroid Function Tests: No results for input(s): TSH, T4TOTAL, FREET4, T3FREE, THYROIDAB in the last 72 hours. Anemia Panel: No results for input(s): VITAMINB12, FOLATE, FERRITIN, TIBC, IRON, RETICCTPCT in the last 72 hours. Sepsis Labs: No results for input(s): PROCALCITON, LATICACIDVEN in the last 168 hours.  No results found for this or any previous visit (from the past 240 hours).       Radiology Studies: No results found.      Scheduled Meds:  carvedilol   3.125 mg Oral BID WC   famotidine   20 mg Oral BID   lipase/protease/amylase  36,000 Units Oral TID WC   oxymetazoline   1 spray Left Nare BID   Continuous Infusions:  lactated ringers  125 mL/hr at 12/30/23 0550     LOS: 3 days    Time spent: 51 minutes    Renato Applebaum, MD [facility]

## 2023-12-30 NOTE — Progress Notes (Signed)
 Referring Physician(s): Ghimire,K  Supervising Physician: Hughes Simmonds  Patient Status:  [facility] - In-pt  Chief Complaint: Acute on chronic abdominal pain, acute on chronic pancreatitis; referred for image guided celiac plexus nerve block   Subjective: Patient known to IR team from recent celiac plexus nerve block on 11/28/2023.  He is a 53 year old male with history of persistent acute on chronic abdominal pain secondary to acute on chronic pancreatitis.  He has had frequent admissions for chronic abdominal pain, latest on 12/26/2023.  He has also undergone prior endoscopic celiac plexus nerve block on 10/20/22.  Pt states that  latest celiac plexus block did help with his abd pain for  approximately 2 weeks.  Request now received for repeat image guided celiac plexus block.  Additional medical history as listed below.  Patient denies fever, headache, chest pain, dyspnea, cough, nausea, vomiting or bleeding.  He does continue to have abdominal pain with some radiation to the back.  Past Medical History:  Diagnosis Date   Atrophy of right kidney    Chronic bronchitis (HCC)    Chronic lower back pain    Chronic pancreatitis (HCC)    Gastroparesis 06/2020   confirmed by emptying study at [facility]   GERD (gastroesophageal reflux disease)    Headache    once/month (05/13/2017)   Hematemesis 10/18/2017   Hematochezia 09/19/2021   High cholesterol    Hypertension    Migraine    a couple/year (05/13/2017)   Nephrolithiasis 05/13/2017   Pancreatitis    Pneumonia ~ 09/2015   Presence of pancreatic duct stent    Recurrent acute pancreatitis    Shortness of breath 05/22/2021   Past Surgical History:  Procedure Laterality Date   BIOPSY  11/02/2020   Procedure: BIOPSY;  Surgeon: Wilhelmenia Aloha Raddle., MD;  Location: [facility] ENDOSCOPY;  Service: Gastroenterology;;   BIOPSY  10/20/2022   Procedure: BIOPSY;  Surgeon: Wilhelmenia Aloha Raddle., MD;  Location: [facility] ENDOSCOPY;  Service: Gastroenterology;;    CHOLECYSTECTOMY N/A 10/23/2017   Procedure: LAPAROSCOPIC CHOLECYSTECTOMY WITH INTRAOPERATIVE CHOLANGIOGRAM;  Surgeon: Gladis Cough, MD;  Location: [facility] ORS;  Service: General;  Laterality: N/A;   COLONOSCOPY WITH PROPOFOL  N/A 11/02/2019   Procedure: COLONOSCOPY WITH PROPOFOL ;  Surgeon: Abran Norleen SAILOR, MD;  Location: [facility] ENDOSCOPY;  Service: Endoscopy;  Laterality: N/A;   ERCP N/A 11/02/2020   Procedure: ENDOSCOPIC RETROGRADE CHOLANGIOPANCREATOGRAPHY (ERCP);  Surgeon: Wilhelmenia Aloha Raddle., MD;  Location: [facility] ENDOSCOPY;  Service: Gastroenterology;  Laterality: N/A;   ESOPHAGOGASTRODUODENOSCOPY N/A 11/02/2020   Procedure: ESOPHAGOGASTRODUODENOSCOPY (EGD);  Surgeon: Wilhelmenia Aloha Raddle., MD;  Location: [facility] ENDOSCOPY;  Service: Gastroenterology;  Laterality: N/A;   ESOPHAGOGASTRODUODENOSCOPY (EGD) WITH PROPOFOL  N/A 11/06/2019   Procedure: ESOPHAGOGASTRODUODENOSCOPY (EGD) WITH PROPOFOL ;  Surgeon: Abran Norleen SAILOR, MD;  Location: [facility] ENDOSCOPY;  Service: Endoscopy;  Laterality: N/A;   ESOPHAGOGASTRODUODENOSCOPY (EGD) WITH PROPOFOL  N/A 10/20/2022   Procedure: ESOPHAGOGASTRODUODENOSCOPY (EGD) WITH PROPOFOL ;  Surgeon: Wilhelmenia Aloha Raddle., MD;  Location: [facility] ENDOSCOPY;  Service: Gastroenterology;  Laterality: N/A;   HEMOSTASIS CLIP PLACEMENT  11/02/2019   Procedure: HEMOSTASIS CLIP PLACEMENT;  Surgeon: Abran Norleen SAILOR, MD;  Location: [facility] ENDOSCOPY;  Service: Endoscopy;;   NEUROLYTIC CELIAC PLEXUS  10/20/2022   Procedure: NEUROLYTIC CELIAC PLEXUS;  Surgeon: Wilhelmenia Aloha Raddle., MD;  Location: [facility] ENDOSCOPY;  Service: Gastroenterology;;   NO PAST SURGERIES     REMOVAL OF STONES  11/02/2020   Procedure: REMOVAL OF STONES;  Surgeon: Wilhelmenia Aloha Raddle., MD;  Location: [facility] ENDOSCOPY;  Service: Gastroenterology;;   CLEDA REMOVAL  11/02/2020   Procedure: STENT REMOVAL;  Surgeon: Wilhelmenia Aloha Raddle., MD;  Location: [facility] ENDOSCOPY;  Service: Gastroenterology;;   UPPER ESOPHAGEAL ENDOSCOPIC ULTRASOUND (EUS) N/A 10/20/2022    Procedure: UPPER ESOPHAGEAL ENDOSCOPIC ULTRASOUND (EUS);  Surgeon: Wilhelmenia Aloha Raddle., MD;  Location: [facility] ENDOSCOPY;  Service: Gastroenterology;  Laterality: N/A;      Allergies: Norvasc  [amlodipine  besylate], Voltaren  [diclofenac ], Prilosec [omeprazole], Hydrochlorothiazide, Ultram  [tramadol ], and Prednisone   Medications: Prior to Admission medications   Medication Sig Start Date End Date Taking? Authorizing Provider  acetaminophen  (TYLENOL ) 500 MG tablet Take 500 mg by mouth every 6 (six) hours as needed.   Yes [provider]  albuterol  (VENTOLIN  HFA) 108 (90 Base) MCG/ACT inhaler Inhale 2 puffs into the lungs every 6 (six) hours as needed for wheezing or shortness of breath.   Yes [provider]  carvedilol  (COREG ) 3.125 MG tablet Take 1 tablet (3.125 mg total) by mouth 2 (two) times daily with a meal. 10/23/22  Yes [name] Toribio GAILS, MD  famotidine  (PEPCID ) 20 MG tablet Take 1 tablet (20 mg total) by mouth 2 (two) times daily. 12/13/23  Yes Arletta Camie BRAVO, PA-C  gabapentin  (NEURONTIN ) 300 MG capsule Take 300 mg by mouth 3 (three) times daily as needed (Pain).   Yes [provider]  lipase/protease/amylase (CREON ) 36000 UNITS CPEP capsule Take 2 capsules with each meal and 1 capsule with each snack (2 snacks/day) 11/29/23  Yes Alena Morrison, Reagan, MD  naloxone  (NARCAN ) nasal spray 4 mg/0.1 mL Place 1 spray into the nose daily as needed (opioid reversal). 07/16/22  Yes [provider]  Naphazoline-Pheniramine (VISINE OP) Place 1 drop into both eyes daily.   Yes [provider]  Nutritional Supplements (ENSURE ORIGINAL) LIQD Take 1 Container by mouth 2 (two) times daily.   Yes [provider]  promethazine  (PHENERGAN ) 25 MG tablet Take 1 tablet (25 mg total) by mouth every 6 (six) hours as needed for up to 5 days for nausea or vomiting. 11/29/23 12/13/23  Everhart, Kirstie, DO     Vital Signs: BP (!) 159/94 (BP Location: Left Arm)    Pulse 64   Temp 98 F (36.7 C) (Oral)   Resp 16   Ht 6' 1 (1.854 m)   Wt 150 lb 9.2 oz (68.3 kg)   SpO2 98%   BMI 19.87 kg/m   Physical Exam awake, alert.  Chest clear to auscultation bilaterally.  Heart with regular rate and rhythm.  Abdomen soft, positive bowel sounds, some tenderness left upper quadrant region to palpation.  No lower extremity edema.  Imaging: No results found.  Labs:  CBC: Recent Labs    12/02/23 0614 12/26/23 1814 12/27/23 0426 12/28/23 0533  WBC 10.9* 10.7* 10.5 7.3  HGB 12.8* 13.8 14.4 12.0*  HCT 37.7* 41.0 44.3 38.5*  PLT 269 127* 127* 96*    COAGS: Recent Labs    11/26/23 0706 12/28/23 0533  INR 1.0 1.0    BMP: Recent Labs    12/02/23 0614 12/26/23 1814 12/27/23 0426 12/28/23 0533  NA 138 140 142 136  K 4.6 3.8 3.9 3.5  CL 103 101 99 97*  CO2 19* 20* 20* 20*  GLUCOSE 106* 136* 150* 107*  BUN 13 9 6  5*  CALCIUM 8.5* 9.4 9.5 8.9  CREATININE 1.11 1.04 0.89 0.78  GFRNONAA >60 >60 >60 >60    LIVER FUNCTION TESTS: Recent Labs    12/02/23 0614 12/26/23 1814 12/27/23 0426 12/28/23 0533  BILITOT 0.7 1.1 1.0 0.9  AST 60* 40 39 24  ALT 58* 42 40 24  ALKPHOS 51 80 84 60  PROT 7.3 7.4 7.8 6.2*  ALBUMIN 3.7 4.3 4.4 3.5    Assessment and Plan: Patient with past medical history sig for gastroparesis, GERD, hyperlipidemia, migraines, hypertension, nephrolithiasis, and persistent acute on chronic abdominal pain secondary to acute on chronic pancreatitis, status post prior endoscopic celiac plexus nerve block in July 2024 and image guided celiac plexus nerve block by IR on 11/28/2023.  Admitted now with persistent abdominal pain and request received for repeat image guided celiac plexus nerve block.  Latest imaging studies have been reviewed by Dr.  Hughes.  Details/risks of procedure, including but not limited to, internal bleeding, infection, injury to adjacent structures discussed with patient with his understanding and consent.   Procedure scheduled for today.   Electronically Signed: D. Franky Rakers, PA-C 12/30/2023, 9:09 AM   I spent a total of 20 minutes at the the patient's bedside AND on the patient's hospital floor or unit, greater than 50% of which was counseling/coordinating care for image guided celiac plexus nerve block    Patient ID: Kyle Berry Niece, male   DOB: Apr 09, 1971, 66 y.o.   MRN: [id number]

## 2023-12-30 NOTE — Plan of Care (Signed)

## 2023-12-30 NOTE — Procedures (Signed)
 Vascular and Interventional Radiology Procedure Note  Patient: Kyle Berry DOB: [birth year] Medical Record Number: [id number] Note Date/Time: 12/30/23 11:40 AM   Performing Physician: Thom Hall, MD Assistant(s): None  Diagnosis: Pancreatitis with intractable abdominal pain.    Procedure: CELIAC PLEXUS NERVE BLOCK   Anesthesia: Conscious Sedation Complications: None Estimated Blood Loss: Minimal Specimens: Sent for None   Findings:  Successful CT-guided celiac plexus nerve block, by a bilateral posterior approach. 40 mg/mL Kenalog  and 0.5% Bupivacaine  was administed Hemostasis of the tract was achieved using Manual Pressure.  Plan: Bed rest for 1 hours.  See detailed procedure note with images in PACS. The patient tolerated the procedure well without incident or complication and was returned to Recovery in stable condition.    Thom Hall, MD Vascular and Interventional Radiology Specialists [facility] Radiology   Pager. [phone] Clinic. [phone]

## 2023-12-31 ENCOUNTER — Other Ambulatory Visit: Payer: Self-pay

## 2023-12-31 DIAGNOSIS — K861 Other chronic pancreatitis: Secondary | ICD-10-CM | POA: Diagnosis not present

## 2023-12-31 DIAGNOSIS — K859 Acute pancreatitis without necrosis or infection, unspecified: Secondary | ICD-10-CM | POA: Diagnosis not present

## 2023-12-31 DIAGNOSIS — R109 Unspecified abdominal pain: Secondary | ICD-10-CM

## 2023-12-31 NOTE — Addendum Note (Signed)
 Addended by: CONCHA NORRIS A on: 12/31/2023 03:14 PM   Modules accepted: Orders

## 2023-12-31 NOTE — Addendum Note (Signed)
 Addended by: CONCHA NORRIS A on: 12/31/2023 03:10 PM   Modules accepted: Orders

## 2023-12-31 NOTE — Progress Notes (Signed)
 Referring Physician(s): Ghimire,K  Supervising Physician: Hughes Simmonds  Patient Status:  [facility] - In-pt  Chief Complaint: Acute on chronic abdominal pain, acute on chronic pancreatitis; s/p image guided celiac plexus nerve block on 12/30/23   Subjective: Patient preparing for discharge home today.  Currently rates his abdominal pain at a 0-1; no additional complaints.   Allergies: Norvasc  [amlodipine  besylate], Voltaren  [diclofenac ], Prilosec [omeprazole], Hydrochlorothiazide, Ultram  [tramadol ], and Prednisone   Medications: Prior to Admission medications   Medication Sig Start Date End Date Taking? Authorizing Provider  acetaminophen  (TYLENOL ) 500 MG tablet Take 500 mg by mouth every 6 (six) hours as needed.   Yes [provider]  albuterol  (VENTOLIN  HFA) 108 (90 Base) MCG/ACT inhaler Inhale 2 puffs into the lungs every 6 (six) hours as needed for wheezing or shortness of breath.   Yes [provider]  carvedilol  (COREG ) 3.125 MG tablet Take 1 tablet (3.125 mg total) by mouth 2 (two) times daily with a meal. 10/23/22  Yes [name] Toribio GAILS, MD  famotidine  (PEPCID ) 20 MG tablet Take 1 tablet (20 mg total) by mouth 2 (two) times daily. 12/13/23  Yes Arletta Camie BRAVO, PA-C  gabapentin  (NEURONTIN ) 300 MG capsule Take 300 mg by mouth 3 (three) times daily as needed (Pain).   Yes [provider]  lipase/protease/amylase (CREON ) 36000 UNITS CPEP capsule Take 2 capsules with each meal and 1 capsule with each snack (2 snacks/day) 11/29/23  Yes Alena Morrison, Reagan, MD  naloxone  (NARCAN ) nasal spray 4 mg/0.1 mL Place 1 spray into the nose daily as needed (opioid reversal). 07/16/22  Yes [provider]  Naphazoline-Pheniramine (VISINE OP) Place 1 drop into both eyes daily.   Yes [provider]  Nutritional Supplements (ENSURE ORIGINAL) LIQD Take 1 Container by mouth 2 (two) times daily.   Yes [provider]  promethazine  (PHENERGAN ) 25 MG  tablet Take 1 tablet (25 mg total) by mouth every 6 (six) hours as needed for up to 5 days for nausea or vomiting. 11/29/23 12/13/23  Everhart, Kirstie, DO     Vital Signs: BP (!) 158/91   Pulse 74   Temp 98.5 F (36.9 C) (Oral)   Resp 18   Ht 6' 1 (1.854 m)   Wt 150 lb 9.2 oz (68.3 kg)   SpO2 96%   BMI 19.87 kg/m   Physical Exam patient awake, alert.  Puncture sites right and left flank regions clean and dry, no hematomas.  Not significantly tender.  Abdomen nontender/ND  Imaging: CT CELIAC PLEXUS BLOCK ANESTHETIC Result Date: 12/30/2023 INDICATION: Abdominal pain. Briefly, 53 year old male with a history of chronic pancreatitis and severe intractable abdominal pain. Prior endoscopic-guided (10/20/2022) and more recently CT-guided celiac plexus blockade (LEFT unilateral, 11/28/2023). EXAM: CELIAC PLEXUS BLOCK CT-GUIDED CELIAC PLEXUS NERVE BLOCK, DIAGNOSTIC AND THERAPEUTIC COMPARISON:  None Available. MEDICATIONS: 80 mg Kenalog  and 30 mL 0.5% bupivacaine .  50 mg Benadryl  IV ANESTHESIA/SEDATION: Moderate (conscious) sedation was employed during this procedure. A total of Versed  4 mg and Fentanyl  100 mcg was administered intravenously. Moderate Sedation Time: 33 minutes. The patient's level of consciousness and vital signs were monitored continuously by radiology nursing throughout the procedure under my direct supervision. CONTRAST:  None. FLUOROSCOPY TIME:  CT dose in mGy was not provided. COMPLICATIONS: None immediate. PROCEDURE: RADIATION DOSE REDUCTION: This exam was performed according to the departmental dose-optimization program which includes automated exposure control, adjustment of the mA and/or kV according to patient size and/or use of iterative reconstruction technique. Informed consent was  obtained from the patient following an explanation of the procedure, risks, benefits and alternatives. A time out was performed prior to the initiation of the procedure. The patient was positioned  prone on the CT table and a limited CT was performed for procedural planning demonstrating an adequate window at the upper abdomen. The procedure was planned. A timeout was performed prior to the initiation of the procedure. The operative site was prepped and draped in the usual sterile fashion. Appropriate trajectory was confirmed with a 22 gauge spinal needle after the adjacent tissues were anesthetized with 1% Lidocaine  with epinephrine. Under intermittent CT guidance, 15 cm 21-gauge Chiba needles were inserted via a posterior approach and the needle tips were positioned in the retroperitoneal space immediately anterolateral to the aorta, at the region of the celiac trunk. A small amount of dilute contrast was injected through the needles to confirm position. A solution containing 2 mL of 40 mg/mL Kenalog  and 15 mL of Bupivacaine  was mixed and injected through each needle. Intermittent CT scanning confirmed appropriate spread into the extra vascular spaces. The needles were removed and hemostasis was achieved with manual compression. A limited postprocedural CT was negative for hemorrhage or additional complication. A dressing was placed. The patient tolerated the procedure well without immediate postprocedural complication. IMPRESSION: Successful CT-guided diagnostic and therapeutic celiac plexus nerve block, via BILATERAL posterior approach. PLAN: The patient will be re-evaluated in the postprocedural setting and again in 3-6 months to assess the efficacy of this nerve block. If successful, the patient may be a candidate for future celiac plexus nerve blocks versus neurolysis. Thom Hall, MD Vascular and Interventional Radiology Specialists [facility] Radiology Electronically Signed   By: Thom Hall M.D.   On: 12/30/2023 13:49    Labs:  CBC: Recent Labs    12/02/23 0614 12/26/23 1814 12/27/23 0426 12/28/23 0533  WBC 10.9* 10.7* 10.5 7.3  HGB 12.8* 13.8 14.4 12.0*  HCT 37.7* 41.0 44.3 38.5*   PLT 269 127* 127* 96*    COAGS: Recent Labs    11/26/23 0706 12/28/23 0533  INR 1.0 1.0    BMP: Recent Labs    12/02/23 0614 12/26/23 1814 12/27/23 0426 12/28/23 0533  NA 138 140 142 136  K 4.6 3.8 3.9 3.5  CL 103 101 99 97*  CO2 19* 20* 20* 20*  GLUCOSE 106* 136* 150* 107*  BUN 13 9 6  5*  CALCIUM 8.5* 9.4 9.5 8.9  CREATININE 1.11 1.04 0.89 0.78  GFRNONAA >60 >60 >60 >60    LIVER FUNCTION TESTS: Recent Labs    12/02/23 0614 12/26/23 1814 12/27/23 0426 12/28/23 0533  BILITOT 0.7 1.1 1.0 0.9  AST 60* 40 39 24  ALT 58* 42 40 24  ALKPHOS 51 80 84 60  PROT 7.3 7.4 7.8 6.2*  ALBUMIN 3.7 4.3 4.4 3.5    Assessment and Plan: Patient with past medical history sig for gastroparesis, GERD, hyperlipidemia, migraines, hypertension, nephrolithiasis, and persistent acute on chronic abdominal pain secondary to acute on chronic pancreatitis, status post prior endoscopic celiac plexus nerve block in July 2024 and image guided celiac plexus nerve block by IR on 11/28/2023.  Admitted now with persistent abdominal pain and request received for repeat image guided celiac plexus nerve block; patient status post CT-guided celiac plexus nerve block yesterday; afebrile, no new labs; patient currently denies significant abdominal pain; puncture site right left flanks are stable; patient was seen by Dr.  Hall; based on patient's current pain response he appears to be candidate  for possible celiac plexus neurolysis in 1 to 3 months; recommend that primary care physician place outpatient order if so desired.   Electronically Signed: D. Franky Rakers, PA-C 12/31/2023, 10:12 AM   I spent a total of 15 Minutes at the the patient's bedside AND on the patient's hospital floor or unit, greater than 50% of which was counseling/coordinating care for image guided celiac plexus nerve block    Patient ID: Ellaree LITTIE Niece, male   DOB: 09-03-1970, 69 y.o.   MRN: [id number]

## 2023-12-31 NOTE — Plan of Care (Signed)

## 2023-12-31 NOTE — Discharge Summary (Signed)
 Physician Discharge Summary  Kyle Berry FMW:[id number] DOB: 1971/04/03 DOA: 12/26/2023  PCP: [facility]  Admit date: 12/26/2023 Discharge date: 12/31/2023  Admitted From: Home Disposition: Home  Recommendations for Outpatient Follow-up:  Follow up with PCP in 1-2 weeks Follow-up with gastroenterology as already scheduled  Home Health: N/A Equipment/Devices: N/A  Discharge Condition: Stable CODE STATUS: Full code Diet recommendation: Soft diet  Discharge summary: 53 year old with history of chronic recurrent pancreatitis and chronic abdominal pain, gastroparesis presented to the emergency room with another episode of abdominal pain and nausea.  Recently admitted and underwent celiac plexus block by IR.  He was admitted to the hospital and treated with aggressive pain medications, required Dilaudid  PCA for pain control.  Ultimately underwent celiac plexus block by IR on 9/15 with complete resolution of symptoms.  Patient is pain-free and nausea free for last 24 hours.  Tolerating regular soft food.  Not requiring any oral pain medications. Significantly, patient had an episode of epistaxis through left naris that improved with pressure and Afrin.  Acute on chronic pancreatitis, acute on chronic abdominal pain: Severe and persistent. Treated with n.p.o., IV pain medications, required Dilaudid  PCA for pain control.  Ultimately improved.  Underwent CT-guided celiac plexus block with excellent pain control and now with normal bowel function and tolerating regular diet.  Discharging with outpatient follow-up.  Continue home medications including carvedilol , gabapentin  and Phenergan  as needed.  Stable to discharge home.   Discharge Diagnoses:  Principal Problem:   Acute on chronic pancreatitis [facility]) Active Problems:   Essential hypertension   Thrombocytopenia [facility])    Discharge Instructions  Discharge Instructions     Diet - low sodium heart  healthy   Complete by: As directed    Increase activity slowly   Complete by: As directed    No wound care   Complete by: As directed       Allergies as of 12/31/2023       Reactions   Norvasc  [amlodipine  Besylate] Other (See Comments)   Fluid buildup in chest   Voltaren  [diclofenac ] Hives, Other (See Comments)   Fluid buildup in chest   Prilosec [omeprazole] Other (See Comments)   MD stopped due to pancreatitis   Hydrochlorothiazide Itching   Ultram  [tramadol ] Other (See Comments)   Stomach upset   Prednisone  Hives, Other (See Comments)   Mood swings        Medication List     TAKE these medications    acetaminophen  500 MG tablet Commonly known as: TYLENOL  Take 500 mg by mouth every 6 (six) hours as needed.   albuterol  108 (90 Base) MCG/ACT inhaler Commonly known as: VENTOLIN  HFA Inhale 2 puffs into the lungs every 6 (six) hours as needed for wheezing or shortness of breath.   carvedilol  3.125 MG tablet Commonly known as: COREG  Take 1 tablet (3.125 mg total) by mouth 2 (two) times daily with a meal.   Ensure Original Liqd Take 1 Container by mouth 2 (two) times daily.   famotidine  20 MG tablet Commonly known as: PEPCID  Take 1 tablet (20 mg total) by mouth 2 (two) times daily.   gabapentin  300 MG capsule Commonly known as: NEURONTIN  Take 300 mg by mouth 3 (three) times daily as needed (Pain).   lipase/protease/amylase 63999 UNITS Cpep capsule Commonly known as: CREON  Take 2 capsules with each meal and 1 capsule with each snack (2 snacks/day)   naloxone  4 MG/0.1ML Liqd nasal spray kit Commonly known as: NARCAN  Place 1  spray into the nose daily as needed (opioid reversal).   promethazine  25 MG tablet Commonly known as: PHENERGAN  Take 1 tablet (25 mg total) by mouth every 6 (six) hours as needed for up to 5 days for nausea or vomiting.   VISINE OP Place 1 drop into both eyes daily.        Allergies  Allergen Reactions   Norvasc  [Amlodipine   Besylate] Other (See Comments)    Fluid buildup in chest   Voltaren  [Diclofenac ] Hives and Other (See Comments)    Fluid buildup in chest   Prilosec [Omeprazole] Other (See Comments)    MD stopped due to pancreatitis   Hydrochlorothiazide Itching   Ultram  [Tramadol ] Other (See Comments)    Stomach upset   Prednisone  Hives and Other (See Comments)    Mood swings    Consultations: Interventional radiology   Procedures/Studies: CT CELIAC PLEXUS BLOCK ANESTHETIC Result Date: 12/30/2023 INDICATION: Abdominal pain. Briefly, 53 year old male with a history of chronic pancreatitis and severe intractable abdominal pain. Prior endoscopic-guided (10/20/2022) and more recently CT-guided celiac plexus blockade (LEFT unilateral, 11/28/2023). EXAM: CELIAC PLEXUS BLOCK CT-GUIDED CELIAC PLEXUS NERVE BLOCK, DIAGNOSTIC AND THERAPEUTIC COMPARISON:  None Available. MEDICATIONS: 80 mg Kenalog  and 30 mL 0.5% bupivacaine .  50 mg Benadryl  IV ANESTHESIA/SEDATION: Moderate (conscious) sedation was employed during this procedure. A total of Versed  4 mg and Fentanyl  100 mcg was administered intravenously. Moderate Sedation Time: 33 minutes. The patient's level of consciousness and vital signs were monitored continuously by radiology nursing throughout the procedure under my direct supervision. CONTRAST:  None. FLUOROSCOPY TIME:  CT dose in mGy was not provided. COMPLICATIONS: None immediate. PROCEDURE: RADIATION DOSE REDUCTION: This exam was performed according to the departmental dose-optimization program which includes automated exposure control, adjustment of the mA and/or kV according to patient size and/or use of iterative reconstruction technique. Informed consent was obtained from the patient following an explanation of the procedure, risks, benefits and alternatives. A time out was performed prior to the initiation of the procedure. The patient was positioned prone on the CT table and a limited CT was performed for  procedural planning demonstrating an adequate window at the upper abdomen. The procedure was planned. A timeout was performed prior to the initiation of the procedure. The operative site was prepped and draped in the usual sterile fashion. Appropriate trajectory was confirmed with a 22 gauge spinal needle after the adjacent tissues were anesthetized with 1% Lidocaine  with epinephrine. Under intermittent CT guidance, 15 cm 21-gauge Chiba needles were inserted via a posterior approach and the needle tips were positioned in the retroperitoneal space immediately anterolateral to the aorta, at the region of the celiac trunk. A small amount of dilute contrast was injected through the needles to confirm position. A solution containing 2 mL of 40 mg/mL Kenalog  and 15 mL of Bupivacaine  was mixed and injected through each needle. Intermittent CT scanning confirmed appropriate spread into the extra vascular spaces. The needles were removed and hemostasis was achieved with manual compression. A limited postprocedural CT was negative for hemorrhage or additional complication. A dressing was placed. The patient tolerated the procedure well without immediate postprocedural complication. IMPRESSION: Successful CT-guided diagnostic and therapeutic celiac plexus nerve block, via BILATERAL posterior approach. PLAN: The patient will be re-evaluated in the postprocedural setting and again in 3-6 months to assess the efficacy of this nerve block. If successful, the patient may be a candidate for future celiac plexus nerve blocks versus neurolysis. Thom Hall, MD [facility]  [facility] Radiology Electronically Signed   By: Thom Hall M.D.   On: 12/30/2023 13:49   (Echo, Carotid, EGD, Colonoscopy, ERCP)    Subjective: Patient seen and examined.  Denies any complaints.  Up about and ready to get out of the hospital.   Discharge Exam: Vitals:   12/31/23 0636 12/31/23 0938  BP: (!) 158/91  (!) 158/91  Pulse: 74 74  Resp: 18   Temp: 98.5 F (36.9 C)   SpO2: 96%    Vitals:   12/30/23 1352 12/30/23 2132 12/31/23 0636 12/31/23 0938  BP: (!) 140/110 (!) 138/97 (!) 158/91 (!) 158/91  Pulse: 86 91 74 74  Resp: 16 17 18    Temp: 98.3 F (36.8 C) 98.6 F (37 C) 98.5 F (36.9 C)   TempSrc: Oral Oral Oral   SpO2: 93% 99% 96%   Weight:      Height:        General: Pt is alert, awake, not in acute distress Cardiovascular: RRR, S1/S2 +, no rubs, no gallops Respiratory: CTA bilaterally, no wheezing, no rhonchi Abdominal: Soft, NT, ND, bowel sounds + Extremities: no edema, no cyanosis    The results of significant diagnostics from this hospitalization (including imaging, microbiology, ancillary and laboratory) are listed below for reference.     Microbiology: No results found for this or any previous visit (from the past 240 hours).   Labs: BNP (last 3 results) No results for input(s): BNP in the last 8760 hours. Basic Metabolic Panel: Recent Labs  Lab 12/26/23 1814 12/27/23 0426 12/28/23 0533  NA 140 142 136  K 3.8 3.9 3.5  CL 101 99 97*  CO2 20* 20* 20*  GLUCOSE 136* 150* 107*  BUN 9 6 5*  CREATININE 1.04 0.89 0.78  CALCIUM 9.4 9.5 8.9  MG  --   --  1.5*  PHOS  --   --  2.1*   Liver Function Tests: Recent Labs  Lab 12/26/23 1814 12/27/23 0426 12/28/23 0533  AST 40 39 24  ALT 42 40 24  ALKPHOS 80 84 60  BILITOT 1.1 1.0 0.9  PROT 7.4 7.8 6.2*  ALBUMIN 4.3 4.4 3.5   Recent Labs  Lab 12/26/23 1814 12/28/23 0533  LIPASE 589* 61*   No results for input(s): AMMONIA in the last 168 hours. CBC: Recent Labs  Lab 12/26/23 1814 12/27/23 0426 12/28/23 0533  WBC 10.7* 10.5 7.3  NEUTROABS  --   --  5.6  HGB 13.8 14.4 12.0*  HCT 41.0 44.3 38.5*  MCV 102.2* 105.2* 105.5*  PLT 127* 127* 96*   Cardiac Enzymes: No results for input(s): CKTOTAL, CKMB, CKMBINDEX, TROPONINI in the last 168 hours. BNP: Invalid input(s):  POCBNP CBG: No results for input(s): GLUCAP in the last 168 hours. D-Dimer No results for input(s): DDIMER in the last 72 hours. Hgb A1c No results for input(s): HGBA1C in the last 72 hours. Lipid Profile No results for input(s): CHOL, HDL, LDLCALC, TRIG, CHOLHDL, LDLDIRECT in the last 72 hours. Thyroid function studies No results for input(s): TSH, T4TOTAL, T3FREE, THYROIDAB in the last 72 hours.  Invalid input(s): FREET3 Anemia work up No results for input(s): VITAMINB12, FOLATE, FERRITIN, TIBC, IRON, RETICCTPCT in the last 72 hours. Urinalysis    Component Value Date/Time   COLORURINE YELLOW 12/26/2023 1911   APPEARANCEUR HAZY (A) 12/26/2023 1911   APPEARANCEUR Clear 10/21/2023 0000   LABSPEC 1.017 12/26/2023 1911   PHURINE 5.0 12/26/2023 1911   GLUCOSEU NEGATIVE 12/26/2023 1911   HGBUR SMALL (  A) 12/26/2023 1911   BILIRUBINUR NEGATIVE 12/26/2023 1911   BILIRUBINUR Negative 10/21/2023 0000   KETONESUR 20 (A) 12/26/2023 1911   PROTEINUR 30 (A) 12/26/2023 1911   UROBILINOGEN 0.2 01/20/2015 1643   NITRITE NEGATIVE 12/26/2023 1911   LEUKOCYTESUR NEGATIVE 12/26/2023 1911   Sepsis Labs Recent Labs  Lab 12/26/23 1814 12/27/23 0426 12/28/23 0533  WBC 10.7* 10.5 7.3   Microbiology No results found for this or any previous visit (from the past 240 hours).   Time coordinating discharge: 35 minutes  SIGNED:   Renato Applebaum, MD  [facility] 12/31/2023, 11:47 AM

## 2024-01-03 ENCOUNTER — Encounter: Payer: Self-pay | Admitting: Gastroenterology

## 2024-01-07 ENCOUNTER — Ambulatory Visit (HOSPITAL_COMMUNITY)

## 2024-01-13 ENCOUNTER — Other Ambulatory Visit: Payer: Self-pay | Admitting: Radiology

## 2024-01-13 DIAGNOSIS — R109 Unspecified abdominal pain: Secondary | ICD-10-CM

## 2024-01-14 NOTE — H&P (Signed)
 Chief Complaint: Intractable abdominal pain, chronic pancreatitis; referred for celiac plexus neurolysis  Referring Provider(s): Nandigam,K  Supervising Physician: Hughes Simmonds  Patient Status: [facility] - Out-pt  History of Present Illness: Kyle Berry is a 53 y.o. male with past medical history of chronic bronchitis, chronic lower back pain, gastroparesis, GERD, hyperlipidemia, hypertension, migraines, nephrolithiasis and acute on chronic abdominal pain secondary to acute on chronic pancreatitis. He has undergone prior endoscopic celiac plexus nerve block on 10/20/22 . He is status post image guided celiac plexus nerve blocks on 11/28/2023 and 12/30/2023 with positive response.  He presents again today for celiac plexus neurolysis.  *** Patient is Full Code  Past Medical History:  Diagnosis Date   Atrophy of right kidney    Chronic bronchitis (HCC)    Chronic lower back pain    Chronic pancreatitis (HCC)    Gastroparesis 06/2020   confirmed by emptying study at [facility]   GERD (gastroesophageal reflux disease)    Headache    once/month (05/13/2017)   Hematemesis 10/18/2017   Hematochezia 09/19/2021   High cholesterol    Hypertension    Migraine    a couple/year (05/13/2017)   Nephrolithiasis 05/13/2017   Pancreatitis    Pneumonia ~ 09/2015   Presence of pancreatic duct stent    Recurrent acute pancreatitis    Shortness of breath 05/22/2021    Past Surgical History:  Procedure Laterality Date   BIOPSY  11/02/2020   Procedure: BIOPSY;  Surgeon: Wilhelmenia Aloha Raddle., MD;  Location: [facility] ENDOSCOPY;  Service: Gastroenterology;;   BIOPSY  10/20/2022   Procedure: BIOPSY;  Surgeon: Wilhelmenia Aloha Raddle., MD;  Location: [facility] ENDOSCOPY;  Service: Gastroenterology;;   CHOLECYSTECTOMY N/A 10/23/2017   Procedure: LAPAROSCOPIC CHOLECYSTECTOMY WITH INTRAOPERATIVE CHOLANGIOGRAM;  Surgeon: Gladis Cough, MD;  Location: [facility] ORS;  Service: General;  Laterality: N/A;   COLONOSCOPY WITH  PROPOFOL  N/A 11/02/2019   Procedure: COLONOSCOPY WITH PROPOFOL ;  Surgeon: Abran Norleen SAILOR, MD;  Location: [facility] ENDOSCOPY;  Service: Endoscopy;  Laterality: N/A;   ERCP N/A 11/02/2020   Procedure: ENDOSCOPIC RETROGRADE CHOLANGIOPANCREATOGRAPHY (ERCP);  Surgeon: Wilhelmenia Aloha Raddle., MD;  Location: [facility] ENDOSCOPY;  Service: Gastroenterology;  Laterality: N/A;   ESOPHAGOGASTRODUODENOSCOPY N/A 11/02/2020   Procedure: ESOPHAGOGASTRODUODENOSCOPY (EGD);  Surgeon: Wilhelmenia Aloha Raddle., MD;  Location: [facility] ENDOSCOPY;  Service: Gastroenterology;  Laterality: N/A;   ESOPHAGOGASTRODUODENOSCOPY (EGD) WITH PROPOFOL  N/A 11/06/2019   Procedure: ESOPHAGOGASTRODUODENOSCOPY (EGD) WITH PROPOFOL ;  Surgeon: Abran Norleen SAILOR, MD;  Location: [facility] ENDOSCOPY;  Service: Endoscopy;  Laterality: N/A;   ESOPHAGOGASTRODUODENOSCOPY (EGD) WITH PROPOFOL  N/A 10/20/2022   Procedure: ESOPHAGOGASTRODUODENOSCOPY (EGD) WITH PROPOFOL ;  Surgeon: Wilhelmenia Aloha Raddle., MD;  Location: [facility] ENDOSCOPY;  Service: Gastroenterology;  Laterality: N/A;   HEMOSTASIS CLIP PLACEMENT  11/02/2019   Procedure: HEMOSTASIS CLIP PLACEMENT;  Surgeon: Abran Norleen SAILOR, MD;  Location: [facility] ENDOSCOPY;  Service: Endoscopy;;   NEUROLYTIC CELIAC PLEXUS  10/20/2022   Procedure: NEUROLYTIC CELIAC PLEXUS;  Surgeon: Wilhelmenia Aloha Raddle., MD;  Location: [facility] ENDOSCOPY;  Service: Gastroenterology;;   NO PAST SURGERIES     REMOVAL OF STONES  11/02/2020   Procedure: REMOVAL OF STONES;  Surgeon: Wilhelmenia Aloha Raddle., MD;  Location: [facility] ENDOSCOPY;  Service: Gastroenterology;;   CLEDA REMOVAL  11/02/2020   Procedure: STENT REMOVAL;  Surgeon: Wilhelmenia Aloha Raddle., MD;  Location: [facility] ENDOSCOPY;  Service: Gastroenterology;;   UPPER ESOPHAGEAL ENDOSCOPIC ULTRASOUND (EUS) N/A 10/20/2022   Procedure: UPPER ESOPHAGEAL ENDOSCOPIC ULTRASOUND (EUS);  Surgeon: Wilhelmenia Aloha Raddle., MD;  Location: [facility] ENDOSCOPY;  Service: Gastroenterology;  Laterality: N/A;    Allergies: Norvasc  [amlodipine   besylate], Voltaren  [diclofenac ], Prilosec [omeprazole], Hydrochlorothiazide, Ultram  [tramadol ], and Prednisone   Medications: Prior to Admission medications   Medication Sig Start Date End Date Taking? Authorizing Provider  acetaminophen  (TYLENOL ) 500 MG tablet Take 500 mg by mouth every 6 (six) hours as needed.    [provider]  albuterol  (VENTOLIN  HFA) 108 (90 Base) MCG/ACT inhaler Inhale 2 puffs into the lungs every 6 (six) hours as needed for wheezing or shortness of breath.    [provider]  carvedilol  (COREG ) 3.125 MG tablet Take 1 tablet (3.125 mg total) by mouth 2 (two) times daily with a meal. 10/23/22   [name] Toribio GAILS, MD  famotidine  (PEPCID ) 20 MG tablet Take 1 tablet (20 mg total) by mouth 2 (two) times daily. 12/13/23   Heinz, Camie BRAVO, PA-C  gabapentin  (NEURONTIN ) 300 MG capsule Take 300 mg by mouth 3 (three) times daily as needed (Pain).    [provider]  lipase/protease/amylase (CREON ) 36000 UNITS CPEP capsule Take 2 capsules with each meal and 1 capsule with each snack (2 snacks/day) 11/29/23   Alena Morrison, Reagan, MD  naloxone  (NARCAN ) nasal spray 4 mg/0.1 mL Place 1 spray into the nose daily as needed (opioid reversal). 07/16/22   [provider]  Naphazoline-Pheniramine (VISINE OP) Place 1 drop into both eyes daily.    [provider]  Nutritional Supplements (ENSURE ORIGINAL) LIQD Take 1 Container by mouth 2 (two) times daily.    [provider]  promethazine  (PHENERGAN ) 25 MG tablet Take 1 tablet (25 mg total) by mouth every 6 (six) hours as needed for up to 5 days for nausea or vomiting. 11/29/23 12/13/23  Everhart, Elyce, DO     Family History  Problem Relation Age of Onset   Hypertension Other    Hypertension Mother    Hypertension Father    Kidney disease Father    Hypertension Sister    Diabetes Sister    Hypertension Brother    Pancreatic cancer Paternal Grandmother    Colon cancer Cousin    Stomach  cancer Neg Hx    Esophageal cancer Neg Hx     Social History   Socioeconomic History   Marital status: Single    Spouse name: Not on file   Number of children: Not on file   Years of education: Not on file   Highest education level: Not on file  Occupational History   Occupation: Horticulturist, commercial  Tobacco Use   Smoking status: Never   Smokeless tobacco: Never  Vaping Use   Vaping status: Never Used  Substance and Sexual Activity   Alcohol use: Not Currently    Comment: remote h/o heavy use   Drug use: No   Sexual activity: Yes    Partners: Female    Birth control/protection: Condom  Other Topics Concern   Not on file  Social History Narrative   Not on file   Social Drivers of Health   Financial Resource Strain: Not at Risk (12/03/2022)   Received from [employer]    How hard is it for you to pay for the very basics like food, housing, heating, medical care, and medications?: 1  Food Insecurity: No Food Insecurity (12/26/2023)   Hunger Vital Sign    Worried About Running Out of Food in the Last Year: Never true    Ran Out of Food in the Last Year: Never true  Transportation Needs: No Transportation Needs (  12/26/2023)   PRAPARE - Administrator, Civil Service (Medical): No    Lack of Transportation (Non-Medical): No  Physical Activity: Not on File (08/03/2021)   Received from [facility]   Physical Activity    Physical Activity: 0  Stress: Not on File (08/03/2021)   Received from [facility]   Stress    Stress: 0  Social Connections: Not on File (12/24/2022)   Received from [facility]   Social Connections    Connectedness: 0      Review of Systems  Vital Signs:   Advance Care Plan: no documents on file  Physical Exam  Imaging: CT CELIAC PLEXUS BLOCK ANESTHETIC Result Date: 12/30/2023 INDICATION: Abdominal pain. Briefly, 53 year old male with a history of chronic pancreatitis and severe intractable abdominal pain. Prior endoscopic-guided  (10/20/2022) and more recently CT-guided celiac plexus blockade (LEFT unilateral, 11/28/2023). EXAM: CELIAC PLEXUS BLOCK CT-GUIDED CELIAC PLEXUS NERVE BLOCK, DIAGNOSTIC AND THERAPEUTIC COMPARISON:  None Available. MEDICATIONS: 80 mg Kenalog  and 30 mL 0.5% bupivacaine .  50 mg Benadryl  IV ANESTHESIA/SEDATION: Moderate (conscious) sedation was employed during this procedure. A total of Versed  4 mg and Fentanyl  100 mcg was administered intravenously. Moderate Sedation Time: 33 minutes. The patient's level of consciousness and vital signs were monitored continuously by radiology nursing throughout the procedure under my direct supervision. CONTRAST:  None. FLUOROSCOPY TIME:  CT dose in mGy was not provided. COMPLICATIONS: None immediate. PROCEDURE: RADIATION DOSE REDUCTION: This exam was performed according to the departmental dose-optimization program which includes automated exposure control, adjustment of the mA and/or kV according to patient size and/or use of iterative reconstruction technique. Informed consent was obtained from the patient following an explanation of the procedure, risks, benefits and alternatives. A time out was performed prior to the initiation of the procedure. The patient was positioned prone on the CT table and a limited CT was performed for procedural planning demonstrating an adequate window at the upper abdomen. The procedure was planned. A timeout was performed prior to the initiation of the procedure. The operative site was prepped and draped in the usual sterile fashion. Appropriate trajectory was confirmed with a 22 gauge spinal needle after the adjacent tissues were anesthetized with 1% Lidocaine  with epinephrine. Under intermittent CT guidance, 15 cm 21-gauge Chiba needles were inserted via a posterior approach and the needle tips were positioned in the retroperitoneal space immediately anterolateral to the aorta, at the region of the celiac trunk. A small amount of dilute contrast  was injected through the needles to confirm position. A solution containing 2 mL of 40 mg/mL Kenalog  and 15 mL of Bupivacaine  was mixed and injected through each needle. Intermittent CT scanning confirmed appropriate spread into the extra vascular spaces. The needles were removed and hemostasis was achieved with manual compression. A limited postprocedural CT was negative for hemorrhage or additional complication. A dressing was placed. The patient tolerated the procedure well without immediate postprocedural complication. IMPRESSION: Successful CT-guided diagnostic and therapeutic celiac plexus nerve block, via BILATERAL posterior approach. PLAN: The patient will be re-evaluated in the postprocedural setting and again in 3-6 months to assess the efficacy of this nerve block. If successful, the patient may be a candidate for future celiac plexus nerve blocks versus neurolysis. Thom Hall, MD [facility] Electronically Signed   By: Thom Hall M.D.   On: 12/30/2023 13:49    Labs:  CBC: Recent Labs    12/02/23 0614 12/26/23 1814 12/27/23 0426 12/28/23 0533  WBC 10.9* 10.7*  10.5 7.3  HGB 12.8* 13.8 14.4 12.0*  HCT 37.7* 41.0 44.3 38.5*  PLT 269 127* 127* 96*    COAGS: Recent Labs    11/26/23 0706 12/28/23 0533  INR 1.0 1.0    BMP: Recent Labs    12/02/23 0614 12/26/23 1814 12/27/23 0426 12/28/23 0533  NA 138 140 142 136  K 4.6 3.8 3.9 3.5  CL 103 101 99 97*  CO2 19* 20* 20* 20*  GLUCOSE 106* 136* 150* 107*  BUN 13 9 6  5*  CALCIUM 8.5* 9.4 9.5 8.9  CREATININE 1.11 1.04 0.89 0.78  GFRNONAA >60 >60 >60 >60    LIVER FUNCTION TESTS: Recent Labs    12/02/23 0614 12/26/23 1814 12/27/23 0426 12/28/23 0533  BILITOT 0.7 1.1 1.0 0.9  AST 60* 40 39 24  ALT 58* 42 40 24  ALKPHOS 51 80 84 60  PROT 7.3 7.4 7.8 6.2*  ALBUMIN 3.7 4.3 4.4 3.5    TUMOR MARKERS: No results for input(s): AFPTM, CEA, CA199,  CHROMGRNA in the last 8760 hours.  Assessment and Plan: 53 y.o. male with past medical history of chronic bronchitis, chronic lower back pain, gastroparesis, GERD, hyperlipidemia, hypertension, migraines, nephrolithiasis and acute on chronic abdominal pain secondary to acute on chronic pancreatitis. He has undergone prior endoscopic celiac plexus nerve block on 10/20/22 . He is status post image guided celiac plexus nerve blocks on 11/28/2023 and 12/30/2023 with positive response.  He presents again today for celiac plexus neurolysis.  Details/risks of procedure, including but not limited to, internal bleeding, infection, injury to adjacent structures, diarrhea, inability to eradicate abdominal pain discussed with patient with his understanding and consent.   Thank you for allowing our service to participate in Kyle Berry 's care.  Electronically Signed: D. Franky Rakers, PA-C   01/14/2024, 3:45 PM      I spent a total of  20 minutes   in face to face in clinical consultation, greater than 50% of which was counseling/coordinating care for image guided celiac plexus neurolysis

## 2024-01-15 ENCOUNTER — Encounter (HOSPITAL_COMMUNITY): Payer: Self-pay

## 2024-01-15 ENCOUNTER — Ambulatory Visit (HOSPITAL_COMMUNITY)
Admission: RE | Admit: 2024-01-15 | Discharge: 2024-01-15 | Disposition: A | Discharge status: Home / Self Care | Source: Ambulatory Visit | Attending: Gastroenterology | Admitting: Gastroenterology

## 2024-01-15 ENCOUNTER — Other Ambulatory Visit: Payer: Self-pay

## 2024-01-15 DIAGNOSIS — K861 Other chronic pancreatitis: Secondary | ICD-10-CM | POA: Diagnosis not present

## 2024-01-15 DIAGNOSIS — R109 Unspecified abdominal pain: Secondary | ICD-10-CM

## 2024-01-15 DIAGNOSIS — I10 Essential (primary) hypertension: Secondary | ICD-10-CM | POA: Diagnosis not present

## 2024-01-15 DIAGNOSIS — Z8719 Personal history of other diseases of the digestive system: Secondary | ICD-10-CM | POA: Diagnosis not present

## 2024-01-15 DIAGNOSIS — Z87442 Personal history of urinary calculi: Secondary | ICD-10-CM | POA: Insufficient documentation

## 2024-01-15 DIAGNOSIS — K219 Gastro-esophageal reflux disease without esophagitis: Secondary | ICD-10-CM | POA: Insufficient documentation

## 2024-01-15 DIAGNOSIS — G8929 Other chronic pain: Secondary | ICD-10-CM | POA: Insufficient documentation

## 2024-01-15 DIAGNOSIS — K3184 Gastroparesis: Secondary | ICD-10-CM | POA: Insufficient documentation

## 2024-01-15 DIAGNOSIS — J42 Unspecified chronic bronchitis: Secondary | ICD-10-CM | POA: Insufficient documentation

## 2024-01-15 LAB — CBC WITH DIFFERENTIAL/PLATELET
Abs Immature Granulocytes: 0.03 K/uL (ref 0.00–0.07)
Basophils Absolute: 0 K/uL (ref 0.0–0.1)
Basophils Relative: 0 %
Eosinophils Absolute: 0 K/uL (ref 0.0–0.5)
Eosinophils Relative: 0 %
HCT: 37.8 % — ABNORMAL LOW (ref 39.0–52.0)
Hemoglobin: 12.8 g/dL — ABNORMAL LOW (ref 13.0–17.0)
Immature Granulocytes: 0 %
Lymphocytes Relative: 23 %
Lymphs Abs: 2 K/uL (ref 0.7–4.0)
MCH: 34.3 pg — ABNORMAL HIGH (ref 26.0–34.0)
MCHC: 33.9 g/dL (ref 30.0–36.0)
MCV: 101.3 fL — ABNORMAL HIGH (ref 80.0–100.0)
Monocytes Absolute: 0.7 K/uL (ref 0.1–1.0)
Monocytes Relative: 8 %
Neutro Abs: 6.1 K/uL (ref 1.7–7.7)
Neutrophils Relative %: 69 %
Platelets: 224 K/uL (ref 150–400)
RBC: 3.73 MIL/uL — ABNORMAL LOW (ref 4.22–5.81)
RDW: 14.6 % (ref 11.5–15.5)
WBC: 8.9 K/uL (ref 4.0–10.5)
nRBC: 0 % (ref 0.0–0.2)

## 2024-01-15 LAB — BASIC METABOLIC PANEL WITH GFR
Anion gap: 12 (ref 5–15)
BUN: 16 mg/dL (ref 6–20)
CO2: 23 mmol/L (ref 22–32)
Calcium: 9.2 mg/dL (ref 8.9–10.3)
Chloride: 102 mmol/L (ref 98–111)
Creatinine, Ser: 0.92 mg/dL (ref 0.61–1.24)
GFR, Estimated: >60 mL/min (ref >60)
Glucose, Bld: 243 mg/dL — ABNORMAL HIGH (ref 70–99)
Potassium: 3.6 mmol/L (ref 3.5–5.1)
Sodium: 138 mmol/L (ref 135–145)

## 2024-01-15 LAB — PROTIME-INR
INR: 0.9 (ref 0.8–1.2)
Prothrombin Time: 13.1 s (ref 11.4–15.2)

## 2024-01-15 MED ORDER — FENTANYL CITRATE (PF) 100 MCG/2ML IJ SOLN
INTRAMUSCULAR | Status: AC
  Filled 2024-01-15: qty 2

## 2024-01-15 MED ORDER — MIDAZOLAM HCL 2 MG/2ML IJ SOLN
INTRAMUSCULAR | Status: AC
  Filled 2024-01-15: qty 2

## 2024-01-15 MED ORDER — SODIUM CHLORIDE 0.9 % IV SOLN: INTRAVENOUS | Status: DC

## 2024-01-15 MED ORDER — IOHEXOL 300 MG/ML  SOLN
10.0000 mL | Freq: Once | INTRAMUSCULAR | Status: AC | PRN
  Administered 2024-01-15: 10 mL via INTRAVENOUS

## 2024-01-15 MED ORDER — FENTANYL CITRATE (PF) 100 MCG/2ML IJ SOLN
INTRAMUSCULAR | Status: AC | PRN
  Administered 2024-01-15: 50 ug via INTRAVENOUS
  Administered 2024-01-15 (×2): 25 ug via INTRAVENOUS

## 2024-01-15 MED ORDER — MIDAZOLAM HCL 2 MG/2ML IJ SOLN
INTRAMUSCULAR | Status: AC | PRN
  Administered 2024-01-15 (×4): .5 mg via INTRAVENOUS
  Administered 2024-01-15: 1 mg via INTRAVENOUS
  Administered 2024-01-15: .5 mg via INTRAVENOUS

## 2024-01-15 MED ORDER — BUPIVACAINE HCL (PF) 0.5 % IJ SOLN
INTRAMUSCULAR | Status: AC
  Filled 2024-01-15: qty 30

## 2024-01-15 MED ORDER — ALCOHOL (ABLYSINOL) 99% IA SOLN
INTRA_ARTERIAL | Status: AC
  Filled 2024-01-15: qty 20

## 2024-01-15 NOTE — Procedures (Signed)
 Vascular and Interventional Radiology Procedure Note  Patient: Kyle Berry DOB: 05-Apr-1971 Medical Record Number: [id number] Note Date/Time: 01/15/24 10:49 AM   Performing Physician: Thom Hall, MD Assistant(s): None  Diagnosis: Chronic pancreatitis with intractable abdominal pain. Prior CPB on 12/30/23   Procedure: CELIAC PLEXUS NEUROLYSIS   Anesthesia: Conscious Sedation Complications: None Estimated Blood Loss: Minimal Specimens: Sent for None   Findings:  Successful CT-guided celiac plexus neurolysis, by a bilateral posterior approach. 20 mL denatured EtOH and 0.5% Bupivacaine  was administed Hemostasis of the tract was achieved using Manual Pressure.   Plan: Bed rest for  1 hours.  See detailed procedure note with images in PACS. The patient tolerated the procedure well without incident or complication and was returned to Recovery in stable condition.    Thom Hall, MD [facility] Radiology   Pager. [phone] Clinic. [phone]

## 2024-01-15 NOTE — Discharge Instructions (Signed)
 Please call Interventional Radiology clinic [phone] with any questions or concerns.  You may remove your dressing and shower tomorrow.  After the procedure, it is common to have: Soreness Bruising Mild pain  Follow these instructions at home:  Medication: Do not use Aspirin or ibuprofen products, such as Advil or Motrin, as it may increase bleeding You may resume your usual medications as ordered by your doctor  Eating and drinking: Drink plenty of liquids to keep your urine pale yellow You can resume your regular diet as directed by your doctor  Care of the procedure site Wash your hands with soap and water before you change your bandage (dressing). If you cannot use soap and water, use hand sanitizer Follow instructions from your doctor about: How to take care of your puncture site When and how to change your bandage When to remove your bandage Check your puncture site every day for signs of infection. Watch for: Redness, swelling, or pain Fluid or blood. Pus or a bad smell Warmth Activity Return to your normal activities as told by your doctor. Ask your doctor what activities are safe for you Do not take baths, swim, or use a hot tub until your health care provider approves. Take showers only Keep all follow-up visits as told by your doctor  Contact a doctor if you have: A fever Redness, swelling, or pain at the puncture site, and it lasts longer than a few days Fluid, blood, or pus coming from the puncture site Warmth coming from the puncture site  Get help right away if: You have a lot of bleeding from the puncture site Moderate Conscious Sedation-Care After  This sheet gives you information about how to care for yourself after your procedure. Your health care provider may also give you more specific instructions. If you have problems or questions, contact your health care provider.  After the procedure, it is common to have: Sleepiness for several  hours. Impaired judgment for several hours. Difficulty with balance. Vomiting if you eat too soon.  Follow these instructions at home:  Rest. Do not participate in activities where you could fall or become injured. Do not drive or use machinery. Do not drink alcohol. Do not take sleeping pills or medicines that cause drowsiness. Do not make important decisions or sign legal documents. Do not take care of children on your own.  Eating and drinking Follow the diet recommended by your health care provider. Drink enough fluid to keep your urine pale yellow. If you vomit: Drink water, juice, or soup when you can drink without vomiting. Make sure you have little or no nausea before eating solid foods.  General instructions Take over-the-counter and prescription medicines only as told by your health care provider. Have a responsible adult stay with you for the time you are told. It is important to have someone help care for you until you are awake and alert. Do not smoke. Keep all follow-up visits as told by your health care provider. This is important.  Contact a health care provider if: You are still sleepy or having trouble with balance after 24 hours. You feel light-headed. You keep feeling nauseous or you keep vomiting. You develop a rash. You have a fever. You have redness or swelling around the IV site.  Get help right away if: You have trouble breathing. You have new-onset confusion at home.  This information is not intended to replace advice given to you by your health care provider. Make sure you discuss any  questions you have with your healthcare provider.

## 2024-01-18 ENCOUNTER — Other Ambulatory Visit (HOSPITAL_COMMUNITY): Payer: Self-pay

## 2024-01-23 ENCOUNTER — Other Ambulatory Visit: Payer: Self-pay | Admitting: Gastroenterology

## 2024-01-23 NOTE — Telephone Encounter (Signed)
 Inbound call from patient stating he was given creon  medication after hospital visit back in August 2025, patient only has 2 pills left tried to f/u with PCP but they only have an appointment for late next week, PCP stated he can reach out to us  in regards to getting medication refilled due to them having to see him in office first. Patient has an appointment with us  03/05/24. Patient would like to know if it can be refilled. Requesting a call back  Please advise  Thank you

## 2024-01-23 NOTE — Telephone Encounter (Signed)
 Spoke with the patient. No changes in his dosage. Remains on Creon  36000 units 2 caps with meal three times a day  and 1 cap with snacks once to twice a day. Using [employer]. Refilling the prescription #100/2R if that is okay.

## 2024-01-27 ENCOUNTER — Other Ambulatory Visit: Payer: Self-pay

## 2024-01-27 ENCOUNTER — Emergency Department (HOSPITAL_COMMUNITY)

## 2024-01-27 ENCOUNTER — Encounter (HOSPITAL_COMMUNITY): Payer: Self-pay

## 2024-01-27 ENCOUNTER — Inpatient Hospital Stay (HOSPITAL_COMMUNITY)
Admission: EM | Admit: 2024-01-27 | Discharge: 2024-01-29 | DRG: 381 | Disposition: A | Discharge status: Home / Self Care | Attending: Internal Medicine | Admitting: Internal Medicine

## 2024-01-27 DIAGNOSIS — E871 Hypo-osmolality and hyponatremia: Secondary | ICD-10-CM | POA: Diagnosis present

## 2024-01-27 DIAGNOSIS — I10 Essential (primary) hypertension: Secondary | ICD-10-CM | POA: Diagnosis not present

## 2024-01-27 DIAGNOSIS — D696 Thrombocytopenia, unspecified: Secondary | ICD-10-CM | POA: Diagnosis present

## 2024-01-27 DIAGNOSIS — K920 Hematemesis: Secondary | ICD-10-CM | POA: Diagnosis not present

## 2024-01-27 DIAGNOSIS — E441 Mild protein-calorie malnutrition: Secondary | ICD-10-CM | POA: Diagnosis present

## 2024-01-27 DIAGNOSIS — Z888 Allergy status to other drugs, medicaments and biological substances status: Secondary | ICD-10-CM

## 2024-01-27 DIAGNOSIS — Z681 Body mass index (BMI) 19 or less, adult: Secondary | ICD-10-CM | POA: Diagnosis not present

## 2024-01-27 DIAGNOSIS — G8929 Other chronic pain: Secondary | ICD-10-CM | POA: Diagnosis not present

## 2024-01-27 DIAGNOSIS — K860 Alcohol-induced chronic pancreatitis: Secondary | ICD-10-CM | POA: Diagnosis present

## 2024-01-27 DIAGNOSIS — E1165 Type 2 diabetes mellitus with hyperglycemia: Secondary | ICD-10-CM | POA: Diagnosis present

## 2024-01-27 DIAGNOSIS — F17210 Nicotine dependence, cigarettes, uncomplicated: Secondary | ICD-10-CM | POA: Diagnosis present

## 2024-01-27 DIAGNOSIS — K861 Other chronic pancreatitis: Secondary | ICD-10-CM | POA: Diagnosis present

## 2024-01-27 DIAGNOSIS — R1010 Upper abdominal pain, unspecified: Secondary | ICD-10-CM | POA: Diagnosis not present

## 2024-01-27 DIAGNOSIS — K2211 Ulcer of esophagus with bleeding: Principal | ICD-10-CM | POA: Diagnosis present

## 2024-01-27 DIAGNOSIS — E7800 Pure hypercholesterolemia, unspecified: Secondary | ICD-10-CM | POA: Diagnosis present

## 2024-01-27 DIAGNOSIS — Z8616 Personal history of COVID-19: Secondary | ICD-10-CM

## 2024-01-27 DIAGNOSIS — K3184 Gastroparesis: Secondary | ICD-10-CM | POA: Diagnosis present

## 2024-01-27 DIAGNOSIS — K922 Gastrointestinal hemorrhage, unspecified: Secondary | ICD-10-CM | POA: Diagnosis not present

## 2024-01-27 DIAGNOSIS — Z860101 Personal history of adenomatous and serrated colon polyps: Secondary | ICD-10-CM

## 2024-01-27 DIAGNOSIS — Z8249 Family history of ischemic heart disease and other diseases of the circulatory system: Secondary | ICD-10-CM | POA: Diagnosis not present

## 2024-01-27 DIAGNOSIS — K625 Hemorrhage of anus and rectum: Secondary | ICD-10-CM | POA: Diagnosis not present

## 2024-01-27 DIAGNOSIS — K2100 Gastro-esophageal reflux disease with esophagitis, without bleeding: Secondary | ICD-10-CM | POA: Diagnosis present

## 2024-01-27 DIAGNOSIS — K8681 Exocrine pancreatic insufficiency: Secondary | ICD-10-CM | POA: Diagnosis present

## 2024-01-27 DIAGNOSIS — N261 Atrophy of kidney (terminal): Secondary | ICD-10-CM | POA: Diagnosis present

## 2024-01-27 DIAGNOSIS — D62 Acute posthemorrhagic anemia: Secondary | ICD-10-CM | POA: Diagnosis not present

## 2024-01-27 DIAGNOSIS — Z9049 Acquired absence of other specified parts of digestive tract: Secondary | ICD-10-CM

## 2024-01-27 DIAGNOSIS — Z79899 Other long term (current) drug therapy: Secondary | ICD-10-CM | POA: Diagnosis not present

## 2024-01-27 DIAGNOSIS — E1143 Type 2 diabetes mellitus with diabetic autonomic (poly)neuropathy: Secondary | ICD-10-CM | POA: Diagnosis present

## 2024-01-27 DIAGNOSIS — K760 Fatty (change of) liver, not elsewhere classified: Secondary | ICD-10-CM | POA: Diagnosis present

## 2024-01-27 DIAGNOSIS — D539 Nutritional anemia, unspecified: Secondary | ICD-10-CM | POA: Diagnosis present

## 2024-01-27 DIAGNOSIS — K209 Esophagitis, unspecified without bleeding: Secondary | ICD-10-CM | POA: Diagnosis not present

## 2024-01-27 DIAGNOSIS — D649 Anemia, unspecified: Secondary | ICD-10-CM | POA: Diagnosis not present

## 2024-01-27 DIAGNOSIS — E861 Hypovolemia: Secondary | ICD-10-CM | POA: Diagnosis present

## 2024-01-27 LAB — CBC
HCT: 38.1 % — ABNORMAL LOW (ref 39.0–52.0)
HCT: 36 % — ABNORMAL LOW (ref 39.0–52.0)
Hemoglobin: 12.8 g/dL — ABNORMAL LOW (ref 13.0–17.0)
Hemoglobin: 12 g/dL — ABNORMAL LOW (ref 13.0–17.0)
MCH: 34 pg (ref 26.0–34.0)
MCH: 34.3 pg — ABNORMAL HIGH (ref 26.0–34.0)
MCHC: 33.6 g/dL (ref 30.0–36.0)
MCHC: 33.3 g/dL (ref 30.0–36.0)
MCV: 101.3 fL — ABNORMAL HIGH (ref 80.0–100.0)
MCV: 102.9 fL — ABNORMAL HIGH (ref 80.0–100.0)
Platelets: 143 K/uL — ABNORMAL LOW (ref 150–400)
Platelets: 139 K/uL — ABNORMAL LOW (ref 150–400)
RBC: 3.76 MIL/uL — ABNORMAL LOW (ref 4.22–5.81)
RBC: 3.5 MIL/uL — ABNORMAL LOW (ref 4.22–5.81)
RDW: 14.1 % (ref 11.5–15.5)
RDW: 14.1 % (ref 11.5–15.5)
WBC: 7.4 K/uL (ref 4.0–10.5)
WBC: 6.4 K/uL (ref 4.0–10.5)
nRBC: 0 % (ref 0.0–0.2)
nRBC: 0 % (ref 0.0–0.2)

## 2024-01-27 LAB — URINALYSIS, ROUTINE W REFLEX MICROSCOPIC
Bacteria, UA: NONE SEEN
Bilirubin Urine: NEGATIVE
Glucose, UA: >=500 mg/dL — AB
Ketones, ur: NEGATIVE mg/dL
Leukocytes,Ua: NEGATIVE
Nitrite: NEGATIVE
Protein, ur: NEGATIVE mg/dL
Specific Gravity, Urine: >1.046 — ABNORMAL HIGH (ref 1.005–1.030)
pH: 5 (ref 5.0–8.0)

## 2024-01-27 LAB — COMPREHENSIVE METABOLIC PANEL WITH GFR
ALT: 27 U/L (ref 0–44)
AST: 26 U/L (ref 15–41)
Albumin: 4.1 g/dL (ref 3.5–5.0)
Alkaline Phosphatase: 68 U/L (ref 38–126)
Anion gap: 18 — ABNORMAL HIGH (ref 5–15)
BUN: 11 mg/dL (ref 6–20)
CO2: 20 mmol/L — ABNORMAL LOW (ref 22–32)
Calcium: 9.2 mg/dL (ref 8.9–10.3)
Chloride: 95 mmol/L — ABNORMAL LOW (ref 98–111)
Creatinine, Ser: 1.01 mg/dL (ref 0.61–1.24)
GFR, Estimated: >60 mL/min (ref >60)
Glucose, Bld: 352 mg/dL — ABNORMAL HIGH (ref 70–99)
Potassium: 3.6 mmol/L (ref 3.5–5.1)
Sodium: 133 mmol/L — ABNORMAL LOW (ref 135–145)
Total Bilirubin: 0.6 mg/dL (ref 0.0–1.2)
Total Protein: 6.9 g/dL (ref 6.5–8.1)

## 2024-01-27 LAB — TYPE AND SCREEN
ABO/RH(D): O POS
Antibody Screen: NEGATIVE

## 2024-01-27 LAB — LIPASE, BLOOD: Lipase: 29 U/L (ref 11–51)

## 2024-01-27 LAB — HEMOGLOBIN A1C
Hgb A1c MFr Bld: 9.7 % — ABNORMAL HIGH (ref 4.8–5.6)
Mean Plasma Glucose: 231.69 mg/dL

## 2024-01-27 MED ORDER — HYDRALAZINE HCL 20 MG/ML IJ SOLN: 5.0000 mg | Freq: Four times a day (QID) | INTRAMUSCULAR | Status: DC | PRN

## 2024-01-27 MED ORDER — IOHEXOL 300 MG/ML  SOLN
100.0000 mL | Freq: Once | INTRAMUSCULAR | Status: AC | PRN
  Administered 2024-01-27: 100 mL via INTRAVENOUS

## 2024-01-27 MED ORDER — SODIUM CHLORIDE 0.9 % IV SOLN: INTRAVENOUS | Status: AC

## 2024-01-27 MED ORDER — PANTOPRAZOLE SODIUM 40 MG IV SOLR
40.0000 mg | Freq: Two times a day (BID) | INTRAVENOUS | Status: DC
  Administered 2024-01-27 – 2024-01-28 (×3): 40 mg via INTRAVENOUS
  Filled 2024-01-27 (×3): qty 10

## 2024-01-27 MED ORDER — ALBUTEROL SULFATE HFA 108 (90 BASE) MCG/ACT IN AERS: 2.0000 | INHALATION_SPRAY | Freq: Four times a day (QID) | RESPIRATORY_TRACT | Status: DC | PRN

## 2024-01-27 MED ORDER — PANCRELIPASE (LIP-PROT-AMYL) 36000-114000 UNITS PO CPEP: ORAL_CAPSULE | ORAL | 2 refills | Status: DC

## 2024-01-27 MED ORDER — CARVEDILOL 3.125 MG PO TABS
3.1250 mg | ORAL_TABLET | Freq: Two times a day (BID) | ORAL | Status: DC
  Administered 2024-01-28 – 2024-01-29 (×3): 3.125 mg via ORAL
  Filled 2024-01-27 (×3): qty 1

## 2024-01-27 MED ORDER — GABAPENTIN 300 MG PO CAPS: 300.0000 mg | ORAL_CAPSULE | Freq: Three times a day (TID) | ORAL | Status: DC | PRN

## 2024-01-27 MED ORDER — PANTOPRAZOLE SODIUM 40 MG IV SOLR: 40.0000 mg | Freq: Two times a day (BID) | INTRAVENOUS | Status: DC

## 2024-01-27 MED ORDER — HYDROMORPHONE HCL 1 MG/ML IJ SOLN
1.0000 mg | INTRAMUSCULAR | Status: DC | PRN
  Administered 2024-01-27 – 2024-01-29 (×11): 1 mg via INTRAVENOUS
  Filled 2024-01-27 (×11): qty 1

## 2024-01-27 MED ORDER — PANCRELIPASE (LIP-PROT-AMYL) 36000-114000 UNITS PO CPEP
36000.0000 [IU] | ORAL_CAPSULE | Freq: Three times a day (TID) | ORAL | Status: DC
  Administered 2024-01-28 – 2024-01-29 (×3): 36000 [IU] via ORAL
  Filled 2024-01-27 (×3): qty 1

## 2024-01-27 MED ORDER — ALBUTEROL SULFATE (2.5 MG/3ML) 0.083% IN NEBU: 2.5000 mg | INHALATION_SOLUTION | Freq: Four times a day (QID) | RESPIRATORY_TRACT | Status: DC | PRN

## 2024-01-27 MED ORDER — SODIUM CHLORIDE 0.9% FLUSH
3.0000 mL | Freq: Two times a day (BID) | INTRAVENOUS | Status: DC
  Administered 2024-01-27 – 2024-01-29 (×4): 3 mL via INTRAVENOUS

## 2024-01-27 MED ORDER — POTASSIUM CHLORIDE 10 MEQ/100ML IV SOLN
10.0000 meq | INTRAVENOUS | Status: AC
  Administered 2024-01-27 (×5): 10 meq via INTRAVENOUS
  Filled 2024-01-27: qty 100

## 2024-01-27 MED ORDER — SODIUM CHLORIDE 0.9 % IV BOLUS
500.0000 mL | Freq: Once | INTRAVENOUS | Status: AC
  Administered 2024-01-27: 500 mL via INTRAVENOUS

## 2024-01-27 MED ORDER — DIPHENHYDRAMINE HCL 50 MG/ML IJ SOLN
12.5000 mg | Freq: Once | INTRAMUSCULAR | Status: AC
  Administered 2024-01-27: 12.5 mg via INTRAVENOUS
  Filled 2024-01-27: qty 1

## 2024-01-27 MED ORDER — METOCLOPRAMIDE HCL 5 MG/ML IJ SOLN
5.0000 mg | Freq: Once | INTRAMUSCULAR | Status: AC
Start: 2024-01-27 — End: 2024-01-27
  Administered 2024-01-27: 5 mg via INTRAVENOUS
  Filled 2024-01-27: qty 2

## 2024-01-27 MED ORDER — SODIUM CHLORIDE 0.9 % IV BOLUS
1000.0000 mL | Freq: Once | INTRAVENOUS | Status: AC
  Administered 2024-01-27: 1000 mL via INTRAVENOUS

## 2024-01-27 MED ORDER — MORPHINE SULFATE (PF) 4 MG/ML IV SOLN
6.0000 mg | Freq: Once | INTRAVENOUS | Status: AC
  Administered 2024-01-27: 6 mg via INTRAVENOUS
  Filled 2024-01-27: qty 2

## 2024-01-27 NOTE — Consult Note (Addendum)
 Referring Provider: Dr. Curtistine Dawn  Primary Care Physician:  [facility] Primary Gastroenterologist: Dr. Shila  Reason for Consultation: Hematemesis   HPI: Kyle Berry is a 53 y.o. male with a past medical history of hypertension, chronic lower back pain, kidney stones, migraine headaches, hepatic steatosis, GERD, diverticulosis, colon polyps, recurrent acute pancreatitis and chronic pancreatitis status post ERCP with pancreatic duct stent placement 08/2020. SPp cholecystectomy 2019.  He was previously admitted to the hospital 11/18/2023 to 11/30/2023 with acute on chronic pancreatitis.  His abdominal pain was poorly controlled therefore he subsequently underwent a celiac plexus block per IR 11/28/2023 and his pain significantly improved. It was discussed that if patient had good response from the celiac plexus block it could be repeated in several months per IR. Dr. Jennefer suggested neurolysis could also be done at some point.  He underwent a repeat celiac plexus block 12/30/2023 and on 01/22/2024.   He developed upper abdominal pain which radiated through to his back with the onset of dark red hematemesis x 2 on Saturday 9/11, x 2 episodes on Sunday 10/12 and x 2 episodes earlier this morning.  He also noted belching up blood in his mouth x 1 prior to arriving to the ED.  No NSAID use.  He takes Famotidine  20 mg p.o. twice daily at home. He stated Omeprazole was previously discontinued due to concerns that it may be contributing to recurrent pancreatitis.  He also noted passing bright red blood with his bowel movements x 3 episodes over the past week.  No associated lower abdominal or rectal pain.  Last alcohol consumption was 8+ months ago. Labs in the ED showed a WBC count of 7.4.  Hemoglobin 12.8 (Hg 14.4 on 9/12 and 12.0 on 12/28/2023).  Platelets 143.  Sodium 133.  Potassium 3.6.  Glucose 352.  BUN 11.  Creatinine 1.01.  Total bili 0.6.  Alk phos 68.  AST 26.   ALT 27.  Lipase 29. CTAP showed colonic diverticulosis without evidence of diverticulitis, hepatic steatosis, mild diffuse pancreatic atrophy without pancreatic ductal dilatation or surrounding inflammatory changes, atrophic right kidney and a mildly enlarged prostate gland.  He reported his prior chronic pancreatitis associated upper abdominal pain nearly abated following the celiac plexus block 11/28/2023.  He denied having any upper abdominal pain prior to 9/15 and 01/22/2024 repeat celiac block procedures.  Previously underwent an EUS with celiac plexus block for Dr. Wilhelmenia 10/2022.  Past ERCP x 2 in 2022 see results below.  His most recent EGD 10/20/2022 showed a 1 cm hiatal hernia and minimal chronic gastritis without evidence of H. pylori or dysplasia.  His most recent colonoscopy 10/30/2019 identified for polyps, two large polyps measuring 20 and 30 mm removed from the colon.  Path report consistent with tubular adenomas.  Repeat colonoscopy in 3 years was recommended.  He developed a post polypectomy GI bleed and underwent a repeat colonoscopy 11/02/2019 and it was thought that the likely culprit was the polypectomy site in the cecum which was treated and clip placed and a clip was also placed to the large sigmoid polypectomy site.  GI PROCEDURES:  Upper EUS 10/20/2022: EGD impression:  - No gross lesions in the entire esophagus. Z- line irregular, 43 cm from the incisors.  - 1 cm hiatal hernia.  - Erythematous mucosa in the stomach. Biopsied.  - No gross lesions in the duodenal bulb, in the first portion of the duodenum and in the second portion of the  duodenum.  - Normal major papilla ( could not truly identify sphincteromy sites) . A. STOMACH, BIOPSY:  Reactive gastropathy with minimal chronic gastritis and focal foveolar  hyperplasia  Negative for H. pylori, intestinal metaplasia, dysplasia and carcinoma    EUS impression:  - Pancreatic parenchymal abnormalities consisting of hyperechoic  foci, lobularity and hyperechoic strands were noted in the pancreatic head, genu of the pancreas, pancreatic body and pancreatic tail.  - The pancreatic duct had hyperechoic walls in the pancreatic head, genu of the pancreas, body of the pancreas and tail of the pancreas. No evidence of intraductal pancreatic stones.  - There was prominence of the common bile duct and in the common hepatic duct.  - There was a suggestion of an inflammatory narrowing/ stricture stricture in the lower third of the main bile duct. His LFTs are completely normal.  - No malignant- appearing lymph nodes were visualized in the celiac region ( level 20) , peripancreatic region and porta hepatis region.  - Celiac plexus block performed as above.    ERCP 11/02/2020 - No gross lesions in esophagus. Z- line irregular, 43 cm from the incisors.  - Gastritis. Biopsied.  - Gastric polyps. Biopsied.  - Erythematous duodenopathy in bulb.  - Prior minor papilla sphincterotomy appeared open. One stent was seen in the minor papilla - this was able to be removed.  - Prior biliary sphincterotomy appeared open.  - Prior pancreatic sphincterotomy appeared open.  - Filling defects consistent with mucin vs pancreatic stones from recent retained stent were seen on the pancreatogram.  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  - Pancreatic stones were found and removed through the minor duct. Complete removal was accomplished via balloon sweeps.  A. STOMACH, RANDOM, BIOPSY:  -  Benign gastric mucosa with mild reactive changes  -  No H. pylori, intestinal metaplasia or malignancy identified   B. STOMACH, POLYPECTOMY:  -  Diminutive fundic gland polyp(s)  -  Gastric mucosa with mild reactive changes  -  No H. pylori, intestinal metaplasia or malignancy identified   ERCP 08/19/2020 at [facility]: - The upper third of the main bile duct was mildly  dilated, acquired. - A biliary sphincterotomy was performed. - Ansa loop  seen question possible incomplete divisum  as the etiology of pancreatitis pancreatic  sphincterotomy performed. - One plastic stent was placed into the ventral  pancreatic duct. - A minor papilla sphincterotomy was performed. - One plastic stent was placed into the dorsal  pancreatic duct.    EUS 07/26/2020:  Impression: EGD Impression: - Normal esophagus. - Normal stomach. - A single duodenal polyp. Resected and retrieved.  EUS Impression: - Pancreatic parenchymal abnormalities consisting of  hyperechoic strands, hyperechoic foci and lobularity  were noted in the entire pancreas. It did appear as  though there was ongoing inflammation likely related  to his recent episode of pancreatitis. He would have 1  major A critiera (hyperechoic foci with shadowing) as  well as 3 minor features which would be consistent  with chronic pancreatitis per [place] criteria  however in the setting of active inflammation they are  less reliable. - Pancreas divisum was not clearly visualized. - There was no sign of significant pathology in the  common bile duct and in the common hepatic duct. - No lymph nodes were visualized in the celiac region  (level 20), peripancreatic region and porta hepatis  region. - There was no evidence of significant pathology in  the left lobe of  the liver. - Normal major papilla.    EGD 11/06/2019: 1. Mild retching gastropathy  2. Otherwise unremarkable EGD  3. No evidence for clinically significant GI bleeding  4. Posthemorrhagic anemia from recent post polypectomy bleed.    EGD 10/30/2019:  Erosive gastritis otherwise unremarkable   Colonoscopy 10/30/2019: Preparation of the colon was fair.  - One 20 mm polyp in the cecum, removed with a hot snare. Resected and retrieved.  - One 5 mm polyp in the transverse colon, removed with a cold snare. Resected and retrieved.  - One less than 1 mm polyp in the transverse colon, removed with a cold biopsy forceps.  Resected and retrieved.  - One 30 mm polyp at the recto-sigmoid colon, removed with a hot snare. Resected and retrieved. Clip (MR conditional) was placed.  - Recall colonoscopy 3 years   1. Surgical [P], random gastric - MILD CHRONIC GASTRITIS. - WARTHIN-STARRY STAIN IS NEGATIVE FOR HELICOBACTER PYLORI. 2. Surgical [P], colon, cecal, polyp - TUBULAR ADENOMA. - NEGATIVE FOR HIGH GRADE DYSPLASIA. 3. Surgical [P], colon, transverse, polyp (2) - TUBULAR ADENOMA (X2). - NEGATIVE FOR HIGH GRADE DYSPLASIA. 4. Surgical [P], colon, rectosigmoid, polyp - TUBULAR ADENOMA. - NEGATIVE FOR HIGH GRADE DYSPLASIA.   Colonoscopy 11/02/2019 1. Post polypectomy bleed. Likely culprit in the cecum treated with clipping due to the presence of stigmata. Also, clipping of large sigmoid colon polypectomy site as described. No active bleeding at the time of the procedure. Hemostasis maintained post therapy  2. Diverticulosis.   Past Medical History:  Diagnosis Date   Atrophy of right kidney    Chronic bronchitis (HCC)    Chronic lower back pain    Chronic pancreatitis (HCC)    Gastroparesis 06/2020   confirmed by emptying study at [facility]   GERD (gastroesophageal reflux disease)    Headache    once/month (05/13/2017)   Hematemesis 10/18/2017   Hematochezia 09/19/2021   High cholesterol    Hypertension    Migraine    a couple/year (05/13/2017)   Nephrolithiasis 05/13/2017   Pancreatitis    Pneumonia ~ 09/2015   Presence of pancreatic duct stent    Recurrent acute pancreatitis    Shortness of breath 05/22/2021    Past Surgical History:  Procedure Laterality Date   BIOPSY  11/02/2020   Procedure: BIOPSY;  Surgeon: Wilhelmenia Aloha Raddle., MD;  Location: [facility] ENDOSCOPY;  Service: Gastroenterology;;   BIOPSY  10/20/2022   Procedure: BIOPSY;  Surgeon: Wilhelmenia Aloha Raddle., MD;  Location: [facility] ENDOSCOPY;  Service: Gastroenterology;;   CHOLECYSTECTOMY N/A 10/23/2017   Procedure: LAPAROSCOPIC CHOLECYSTECTOMY  WITH INTRAOPERATIVE CHOLANGIOGRAM;  Surgeon: Gladis Cough, MD;  Location: [facility] ORS;  Service: General;  Laterality: N/A;   COLONOSCOPY WITH PROPOFOL  N/A 11/02/2019   Procedure: COLONOSCOPY WITH PROPOFOL ;  Surgeon: Abran Norleen SAILOR, MD;  Location: [facility] ENDOSCOPY;  Service: Endoscopy;  Laterality: N/A;   ERCP N/A 11/02/2020   Procedure: ENDOSCOPIC RETROGRADE CHOLANGIOPANCREATOGRAPHY (ERCP);  Surgeon: Wilhelmenia Aloha Raddle., MD;  Location: [facility] ENDOSCOPY;  Service: Gastroenterology;  Laterality: N/A;   ESOPHAGOGASTRODUODENOSCOPY N/A 11/02/2020   Procedure: ESOPHAGOGASTRODUODENOSCOPY (EGD);  Surgeon: Wilhelmenia Aloha Raddle., MD;  Location: [facility] ENDOSCOPY;  Service: Gastroenterology;  Laterality: N/A;   ESOPHAGOGASTRODUODENOSCOPY (EGD) WITH PROPOFOL  N/A 11/06/2019   Procedure: ESOPHAGOGASTRODUODENOSCOPY (EGD) WITH PROPOFOL ;  Surgeon: Abran Norleen SAILOR, MD;  Location: [facility] ENDOSCOPY;  Service: Endoscopy;  Laterality: N/A;   ESOPHAGOGASTRODUODENOSCOPY (EGD) WITH PROPOFOL  N/A 10/20/2022   Procedure: ESOPHAGOGASTRODUODENOSCOPY (EGD) WITH PROPOFOL ;  Surgeon: Wilhelmenia Aloha Raddle., MD;  Location: [facility] ENDOSCOPY;  Service: Gastroenterology;  Laterality: N/A;   HEMOSTASIS CLIP PLACEMENT  11/02/2019   Procedure: HEMOSTASIS CLIP PLACEMENT;  Surgeon: Abran Norleen SAILOR, MD;  Location: [facility] ENDOSCOPY;  Service: Endoscopy;;   NEUROLYTIC CELIAC PLEXUS  10/20/2022   Procedure: NEUROLYTIC CELIAC PLEXUS;  Surgeon: Wilhelmenia Aloha Raddle., MD;  Location: [facility] ENDOSCOPY;  Service: Gastroenterology;;   NO PAST SURGERIES     REMOVAL OF STONES  11/02/2020   Procedure: REMOVAL OF STONES;  Surgeon: Wilhelmenia Aloha Raddle., MD;  Location: [facility] ENDOSCOPY;  Service: Gastroenterology;;   CLEDA REMOVAL  11/02/2020   Procedure: STENT REMOVAL;  Surgeon: Wilhelmenia Aloha Raddle., MD;  Location: [facility] ENDOSCOPY;  Service: Gastroenterology;;   UPPER ESOPHAGEAL ENDOSCOPIC ULTRASOUND (EUS) N/A 10/20/2022   Procedure: UPPER ESOPHAGEAL ENDOSCOPIC ULTRASOUND (EUS);  Surgeon:  Wilhelmenia Aloha Raddle., MD;  Location: [facility] ENDOSCOPY;  Service: Gastroenterology;  Laterality: N/A;    Prior to Admission medications   Medication Sig Start Date End Date Taking? Authorizing Provider  acetaminophen  (TYLENOL ) 500 MG tablet Take 500 mg by mouth every 6 (six) hours as needed.    [provider]  albuterol  (VENTOLIN  HFA) 108 (90 Base) MCG/ACT inhaler Inhale 2 puffs into the lungs every 6 (six) hours as needed for wheezing or shortness of breath.    [provider]  carvedilol  (COREG ) 3.125 MG tablet Take 1 tablet (3.125 mg total) by mouth 2 (two) times daily with a meal. 10/23/22   [name] Toribio GAILS, MD  famotidine  (PEPCID ) 20 MG tablet Take 1 tablet (20 mg total) by mouth 2 (two) times daily. 12/13/23   Heinz, Camie BRAVO, PA-C  gabapentin  (NEURONTIN ) 300 MG capsule Take 300 mg by mouth 3 (three) times daily as needed (Pain).    [provider]  lipase/protease/amylase (CREON ) 36000 UNITS CPEP capsule Take 2 capsules with each meal and 1 capsule with each snack (2 snacks/day) 01/27/24   Nandigam, Kavitha V, MD  naloxone  (NARCAN ) nasal spray 4 mg/0.1 mL Place 1 spray into the nose daily as needed (opioid reversal). 07/16/22   [provider]  Naphazoline-Pheniramine (VISINE OP) Place 1 drop into both eyes daily.    [provider]  Nutritional Supplements (ENSURE ORIGINAL) LIQD Take 1 Container by mouth 2 (two) times daily.    [provider]  promethazine  (PHENERGAN ) 25 MG tablet Take 1 tablet (25 mg total) by mouth every 6 (six) hours as needed for up to 5 days for nausea or vomiting. 11/29/23 12/13/23  Everhart, Kirstie, DO    No current facility-administered medications for this encounter.   Current Outpatient Medications  Medication Sig Dispense Refill   acetaminophen  (TYLENOL ) 500 MG tablet Take 500 mg by mouth every 6 (six) hours as needed.     albuterol  (VENTOLIN  HFA) 108 (90 Base) MCG/ACT inhaler Inhale 2 puffs into the lungs  every 6 (six) hours as needed for wheezing or shortness of breath.     carvedilol  (COREG ) 3.125 MG tablet Take 1 tablet (3.125 mg total) by mouth 2 (two) times daily with a meal. 60 tablet 1   famotidine  (PEPCID ) 20 MG tablet Take 1 tablet (20 mg total) by mouth 2 (two) times daily. 60 tablet 3   gabapentin  (NEURONTIN ) 300 MG capsule Take 300 mg by mouth 3 (three) times daily as needed (Pain).     lipase/protease/amylase (CREON ) 36000 UNITS CPEP capsule Take 2 capsules with each meal and 1 capsule with each snack (2 snacks/day) 100 capsule 2   naloxone  (NARCAN ) nasal spray 4 mg/0.1 mL Place 1  spray into the nose daily as needed (opioid reversal).     Naphazoline-Pheniramine (VISINE OP) Place 1 drop into both eyes daily.     Nutritional Supplements (ENSURE ORIGINAL) LIQD Take 1 Container by mouth 2 (two) times daily.     promethazine  (PHENERGAN ) 25 MG tablet Take 1 tablet (25 mg total) by mouth every 6 (six) hours as needed for up to 5 days for nausea or vomiting. 20 tablet 0    Allergies as of 01/27/2024 - Review Complete 01/27/2024  Allergen Reaction Noted   Norvasc  [amlodipine  besylate] Other (See Comments) 08/15/2016   Voltaren  [diclofenac ] Hives and Other (See Comments) 12/14/2013   Prilosec [omeprazole] Other (See Comments) 08/15/2016   Hydrochlorothiazide Itching 09/19/2021   Ultram  [tramadol ] Other (See Comments) 09/24/2022   Prednisone  Hives and Other (See Comments) 12/14/2013    Family History  Problem Relation Age of Onset   Hypertension Other    Hypertension Mother    Hypertension Father    Kidney disease Father    Hypertension Sister    Diabetes Sister    Hypertension Brother    Pancreatic cancer Paternal Grandmother    Colon cancer Cousin    Stomach cancer Neg Hx    Esophageal cancer Neg Hx     Social History   Socioeconomic History   Marital status: Single    Spouse name: Not on file   Number of children: Not on file   Years of education: Not on file   Highest  education level: Not on file  Occupational History   Occupation: Horticulturist, commercial  Tobacco Use   Smoking status: Some Days    Types: Cigarettes   Smokeless tobacco: Never  Vaping Use   Vaping status: Never Used  Substance and Sexual Activity   Alcohol use: Not Currently   Drug use: No   Sexual activity: Yes    Partners: Female    Birth control/protection: Condom  Other Topics Concern   Not on file  Social History Narrative   Not on file   Social Drivers of Health   Financial Resource Strain: Not at Risk (12/03/2022)   Received from [employer]    How hard is it for you to pay for the very basics like food, housing, heating, medical care, and medications?: 1  Food Insecurity: No Food Insecurity (12/26/2023)   Hunger Vital Sign    Worried About Running Out of Food in the Last Year: Never true    Ran Out of Food in the Last Year: Never true  Transportation Needs: No Transportation Needs (12/26/2023)   PRAPARE - Administrator, Civil Service (Medical): No    Lack of Transportation (Non-Medical): No  Physical Activity: Not on File (08/03/2021)   Received from [facility]   Physical Activity    Physical Activity: 0  Stress: Not on File (08/03/2021)   Received from [facility]   Stress    Stress: 0  Social Connections: Not on File (12/24/2022)   Received from [facility]   Social Connections    Connectedness: 0  Intimate Partner Violence: Not At Risk (12/26/2023)   Humiliation, Afraid, Rape, and Kick questionnaire    Fear of Current or Ex-Partner: No    Emotionally Abused: No    Physically Abused: No    Sexually Abused: No    Review of Systems: Gen: Denies fever, sweats or chills. No weight loss.  CV: Denies chest pain, palpitations or edema. Resp: Denies cough, shortness of breath of hemoptysis.  GI: See HPI.  GU : Denies urinary burning, blood in urine, increased urinary frequency or incontinence. MS: Denies joint pain, muscles aches or weakness. Derm:  Denies rash, itchiness, skin lesions or unhealing ulcers. Psych: Denies depression, anxiety, memory loss or confusion. Heme: Denies easy bruising, bleeding. Neuro:  Denies headaches, dizziness or paresthesias. Endo:  Denies any problems with DM, thyroid or adrenal function.  Physical Exam: Vital signs in last 24 hours: Temp:  [98.1 F (36.7 C)] 98.1 F (36.7 C) (10/13 0918) Pulse Rate:  [90-105] 90 (10/13 1100) Resp:  [16] 16 (10/13 1100) BP: (119-134)/(81-91) 134/91 (10/13 1100) SpO2:  [94 %-99 %] 98 % (10/13 1137) Weight:  [68 kg] 68 kg (10/13 0925)   General: Alert 53 year old male in no acute distress. Head:  Normocephalic and atraumatic. Eyes:  No scleral icterus. Conjunctiva pink. Ears:  Normal auditory acuity. Nose:  No deformity, discharge or lesions. Mouth:  Dentition intact. No ulcers or lesions.  Neck:  Supple. No lymphadenopathy or thyromegaly.  Lungs: Breath sounds clear throughout. No wheezes, rhonchi or crackles.  Heart: Regular rate and rhythm, no murmurs. Abdomen: Soft, nondistended.  LUQ tenderness without rebound or guarding.  Positive bowel sounds to all 4 quadrants. Rectal: Deferred. Musculoskeletal:  Symmetrical without gross deformities.  Pulses:  Normal pulses noted. Extremities:  Without clubbing or edema. Neurologic:  Alert and  oriented x 4. No focal deficits.  Skin:  Intact without significant lesions or rashes. Psych:  Alert and cooperative. Normal mood and affect.  Intake/Output from previous day: No intake/output data recorded. Intake/Output this shift: No intake/output data recorded.  Lab Results: Recent Labs    01/27/24 0957  WBC 7.4  HGB 12.8*  HCT 38.1*  PLT 143*   BMET Recent Labs    01/27/24 0957  NA 133*  K 3.6  CL 95*  CO2 20*  GLUCOSE 352*  BUN 11  CREATININE 1.01  CALCIUM 9.2   LFT Recent Labs    01/27/24 0957  PROT 6.9  ALBUMIN 4.1  AST 26  ALT 27  ALKPHOS 68  BILITOT 0.6    Studies/Results: CT  ABDOMEN PELVIS W CONTRAST Result Date: 01/27/2024 CLINICAL DATA:  Generalized abdominal pain EXAM: CT ABDOMEN AND PELVIS WITH CONTRAST TECHNIQUE: Multidetector CT imaging of the abdomen and pelvis was performed using the standard protocol following bolus administration of intravenous contrast. RADIATION DOSE REDUCTION: This exam was performed according to the departmental dose-optimization program which includes automated exposure control, adjustment of the mA and/or kV according to patient size and/or use of iterative reconstruction technique. CONTRAST:  OMNIPAQUE  IOHEXOL  300 MG/ML  SOLN COMPARISON:  November 22, 2023 FINDINGS: Lower chest: No acute abnormality. Hepatobiliary: There is diffuse fatty infiltration of the liver parenchyma. No focal liver abnormality is seen. Status post cholecystectomy. No biliary dilatation. Pancreas: Mild diffuse pancreatic atrophy is noted. No pancreatic ductal dilatation or surrounding inflammatory changes. Spleen: Normal in size without focal abnormality. Adrenals/Urinary Tract: Adrenal glands are unremarkable. The right kidney is atrophic. There is mild compensatory hypertrophy of the left kidney, without renal calculi, focal lesion, or hydronephrosis. Bladder is unremarkable. Stomach/Bowel: Stomach is within normal limits. Appendix appears normal. No evidence of bowel wall thickening, distention, or inflammatory changes. Noninflamed diverticula are seen throughout the large bowel. Vascular/Lymphatic: No significant vascular findings are present. No enlarged abdominal or pelvic lymph nodes. Reproductive: The prostate gland is mildly enlarged. Other: No abdominal wall hernia or abnormality. No abdominopelvic ascites. Musculoskeletal: Degenerative changes are seen at the level of  L5-S1. IMPRESSION: 1. Hepatic steatosis. 2. Colonic diverticulosis. 3. Atrophic right kidney. 4. Mildly enlarged prostate gland. Electronically Signed   By: Suzen Dials M.D.   On: 01/27/2024  11:44    IMPRESSION/PLAN:  53 year old male with history of GERD presents with hematemesis x and upper abdominal pain which radiates around to the mid back x 3 days.  No NSAID use.  Hemodynamically stable. - NPO - IV fluids per the hospitalist  - Famotidine  20mg  IV bid (PPI previously discontinued d/t possible contributing factor to recurrent pancreatitis) - Ondansetron  4 mg PR IV every 6 hours as needed - Defer endoscopic recommendations to Dr. Avram  Chronic upper abdominal pain secondary to acute and chronic pancreatitis status post celiac plexus block 11/28/2023, 12/30/2023 and 01/22/2024. Chronic abdominal pain significantly improved following 11/28/2023 celiac plexus block, abdominal pain recurred on 10/11. CTAP showed mild diffuse pancreatic atrophy without pancreatic ductal dilatation or surrounding inflammatory changes. Normal LFTs and lipase level.  - Pain management per the hospitalist - Start Creon  when patient resumes a solid diet, currently NPO  Mild anemia. Hg 12.8 ( Hg 12.0 - 14.4 one month ago). - See plan above - Transfuse for Hg level < 8 or as needed if symptomatic  - CBC and iron panel in am  Bright red blood per the rectum x 3 episodes within the past week without associated lower abdominal or rectal pain - Continue to monitor the patient for GI bleeding - Eventual colonoscopy, likely as an outpatient   History of colon polyps.  Colonoscopy 10/30/2019 identified four polyps, two large polyps measuring 20 and 30 mm, all removed from the colon. Path report consistent with tubular adenomas.  He developed a post polypectomy GI bleed and underwent a repeat colonoscopy 7/19/202 and 2 clips were placed at the large cecal and rectosigmoid polypectomy sites. - Eventual colonoscopy, likely as an outpatient    Elida CHRISTELLA Shawl  01/27/2024, 2:49 PM    [facility] Attending   I have taken a history, reviewed the chart and examined the patient. I have performed the  majority of the medical decision making.   I agree with the Advanced Practitioner's note, impression and recommendations with the following additions:  1) Schedule EGD to evaluate hematemesis tomorrow - The risks and benefits as well as alternatives of endoscopic procedure(s) have been discussed and reviewed. All questions answered. The patient agrees to proceed.  2)OK to use pantoprazole  which was started by [facility]. - there are reports of omeprazole causing pancreatitis. There is only 1 report of pantoprazole  thought to have caused pancreatitis in a child.  3) rectal bleeding reported - none this AM - monitor - rectal exam suggests internal hemorrhoids to me today, no blood seen  40 Check INR in AM - ordered  Lupita CHARLENA Avram, MD, [facility] Gastroenterology See TRACEY on call - gastroenterology for best contact person 01/27/2024 3:46 PM

## 2024-01-27 NOTE — ED Notes (Signed)
 Pt advises being unable to void for UA at this time, will continue to check and encourage, provided pt with urinal, pt aware the need for urine.

## 2024-01-27 NOTE — ED Triage Notes (Signed)
 Patient had a procedure done on 10/8 (celiac plexus block). Has been vomiting dark red blood for 5 days. Has generalized abdominal pain. Losing weight.

## 2024-01-27 NOTE — ED Provider Notes (Signed)
 [place] EMERGENCY DEPARTMENT AT [facility] Provider Note   CSN: [id number] Arrival date & time: 01/27/24  9084     Patient presents with: Emesis   Kyle Berry is a 53 y.o. male.   53 year old male with history of chronic pancreatitis who recently had a celiac plexus injection presents due to hematemesis x 3 days.  States that after the procedure he started vomiting blood which she describes as bright red.  He has noted diffuse persistent sharp abdominal discomfort.  States he did have some black stools.  Denies any prior to GI bleeding.  Does not use any blood thinners.  No fever or chills.  States that today when he was at work he belched up blood which concerned him.  He has been in contact with his GI doctor who told him to just observe his symptoms.  He denies any dizziness or lightheadedness when standing up.       Prior to Admission medications   Medication Sig Start Date End Date Taking? Authorizing Provider  acetaminophen  (TYLENOL ) 500 MG tablet Take 500 mg by mouth every 6 (six) hours as needed.    [provider]  albuterol  (VENTOLIN  HFA) 108 (90 Base) MCG/ACT inhaler Inhale 2 puffs into the lungs every 6 (six) hours as needed for wheezing or shortness of breath.    [provider]  carvedilol  (COREG ) 3.125 MG tablet Take 1 tablet (3.125 mg total) by mouth 2 (two) times daily with a meal. 10/23/22   [name] Toribio GAILS, MD  famotidine  (PEPCID ) 20 MG tablet Take 1 tablet (20 mg total) by mouth 2 (two) times daily. 12/13/23   Heinz, Camie BRAVO, PA-C  gabapentin  (NEURONTIN ) 300 MG capsule Take 300 mg by mouth 3 (three) times daily as needed (Pain).    [provider]  lipase/protease/amylase (CREON ) 36000 UNITS CPEP capsule Take 2 capsules with each meal and 1 capsule with each snack (2 snacks/day) 01/27/24   Nandigam, Kavitha V, MD  naloxone  (NARCAN ) nasal spray 4 mg/0.1 mL Place 1 spray into the nose daily as needed (opioid reversal).  07/16/22   [provider]  Naphazoline-Pheniramine (VISINE OP) Place 1 drop into both eyes daily.    [provider]  Nutritional Supplements (ENSURE ORIGINAL) LIQD Take 1 Container by mouth 2 (two) times daily.    [provider]  promethazine  (PHENERGAN ) 25 MG tablet Take 1 tablet (25 mg total) by mouth every 6 (six) hours as needed for up to 5 days for nausea or vomiting. 11/29/23 12/13/23  Everhart, Elyce, DO    Allergies: Norvasc  [amlodipine  besylate], Voltaren  [diclofenac ], Prilosec [omeprazole], Hydrochlorothiazide, Ultram  [tramadol ], and Prednisone     Review of Systems  All other systems reviewed and are negative.   Updated Vital Signs BP 119/81 (BP Location: Right Arm)   Pulse (!) 105   Temp 98.1 F (36.7 C) (Oral)   Resp 16   Ht 1.854 m (6' 1)   Wt 68 kg   SpO2 94%   BMI 19.79 kg/m   Physical Exam Vitals and nursing note reviewed.  Constitutional:      General: He is not in acute distress.    Appearance: Normal appearance. He is well-developed. He is not toxic-appearing.  HENT:     Head: Normocephalic and atraumatic.  Eyes:     General: Lids are normal.     Conjunctiva/sclera: Conjunctivae normal.     Pupils: Pupils are equal, round, and reactive to light.  Neck:  Thyroid: No thyroid mass.     Trachea: No tracheal deviation.  Cardiovascular:     Rate and Rhythm: Normal rate and regular rhythm.     Heart sounds: Normal heart sounds. No murmur heard.    No gallop.  Pulmonary:     Effort: Pulmonary effort is normal. No respiratory distress.     Breath sounds: Normal breath sounds. No stridor. No decreased breath sounds, wheezing, rhonchi or rales.  Abdominal:     General: There is no distension.     Palpations: Abdomen is soft.     Tenderness: There is no abdominal tenderness. There is no rebound.   Musculoskeletal:        General: No tenderness. Normal range of motion.     Cervical back: Normal range of motion and neck supple.   Skin:    General: Skin is warm and dry.     Findings: No abrasion or rash.  Neurological:     Mental Status: He is alert and oriented to person, place, and time. Mental status is at baseline.     GCS: GCS eye subscore is 4. GCS verbal subscore is 5. GCS motor subscore is 6.     Cranial Nerves: No cranial nerve deficit.     Sensory: No sensory deficit.     Motor: Motor function is intact.  Psychiatric:        Attention and Perception: Attention normal.        Speech: Speech normal.        Behavior: Behavior normal.     (all labs ordered are listed, but only abnormal results are displayed) Labs Reviewed  LIPASE, BLOOD  COMPREHENSIVE METABOLIC PANEL WITH GFR  CBC  URINALYSIS, ROUTINE W REFLEX MICROSCOPIC    EKG: None  Radiology: No results found.   Procedures   Medications Ordered in the ED - No data to display                                  Medical Decision Making Amount and/or Complexity of Data Reviewed Labs: ordered. Radiology: ordered.  Risk Prescription drug management.     Patient with stable hemoglobin.  CT scan of abdomen shows no acute findings.  Case discussed with GI who recommends hospitalist admission and they will see the patient in consultation.     Final diagnoses:  None    ED Discharge Orders     None          Dasie Faden, MD 01/27/24 1255

## 2024-01-27 NOTE — Plan of Care (Signed)

## 2024-01-27 NOTE — H&P (Signed)
 History and Physical  Kyle Berry FMW:[id number] DOB: 1970/12/31 DOA: 01/27/2024  PCP: [facility] Patient coming from: Home  I have personally briefly reviewed patient's old medical records in [facility]   Chief Complaint: Hematemesis  HPI: Kyle Berry is a 53 y.o. male past medical history of essential hypertension, chronic alcoholic pancreatitis, history of diverticulosis and recurrent acute pancreatitis history of ERCP with pancreatic duct stenting, with a multiple CT-guided celiac plexus block on 11/28/2023 and 12/30/2023 had a celiac plexus neurolysis on 01/15/2024 comes in for abdominal pain and hematemesis that started about 5 days prior to admission. He relates he has lost count of the amount of times he has thrown up but is vomited about 2 teaspoons of blood at least 5 times, his last 1 was upon arrival to the ED he denies any NSAIDs takes famotidine  twice a day, denies any dizziness or lightheadedness upon standing.  Also relates some melanotic stools x 3 over the last week.  He relates he has learned his lesson if his last consumption of alcohol was more than 8 months ago.  In the ED: CT scan of the abdomen pelvis showed hepatic steatosis colonic diverticulosis mildly enlarged prostate.  Hemoglobin of 12 which is around his baseline, sodium 133 chloride 95 blood glucose 352, UA showed no evidence of infection but does show specific gravity of 1046   Review of Systems: All systems reviewed and apart from history of presenting illness, are negative.  Past Medical History:  Diagnosis Date   Atrophy of right kidney    Chronic bronchitis (HCC)    Chronic lower back pain    Chronic pancreatitis (HCC)    Gastroparesis 06/2020   confirmed by emptying [facility] at [facility]   GERD (gastroesophageal reflux disease)    Headache    once/month (05/13/2017)   Hematemesis 10/18/2017   Hematochezia 09/19/2021   High cholesterol    Hypertension     Migraine    a couple/year (05/13/2017)   Nephrolithiasis 05/13/2017   Pancreatitis    Pneumonia ~ 09/2015   Presence of pancreatic duct stent    Recurrent acute pancreatitis    Shortness of breath 05/22/2021   Past Surgical History:  Procedure Laterality Date   BIOPSY  11/02/2020   Procedure: BIOPSY;  Surgeon: Wilhelmenia Aloha Raddle., MD;  Location: [facility] ENDOSCOPY;  Service: Gastroenterology;;   BIOPSY  10/20/2022   Procedure: BIOPSY;  Surgeon: Wilhelmenia Aloha Raddle., MD;  Location: [facility] ENDOSCOPY;  Service: Gastroenterology;;   CHOLECYSTECTOMY N/A 10/23/2017   Procedure: LAPAROSCOPIC CHOLECYSTECTOMY WITH INTRAOPERATIVE CHOLANGIOGRAM;  Surgeon: Gladis Cough, MD;  Location: [facility] ORS;  Service: General;  Laterality: N/A;   COLONOSCOPY WITH PROPOFOL  N/A 11/02/2019   Procedure: COLONOSCOPY WITH PROPOFOL ;  Surgeon: Abran Norleen SAILOR, MD;  Location: [facility] ENDOSCOPY;  Service: Endoscopy;  Laterality: N/A;   ERCP N/A 11/02/2020   Procedure: ENDOSCOPIC RETROGRADE CHOLANGIOPANCREATOGRAPHY (ERCP);  Surgeon: Wilhelmenia Aloha Raddle., MD;  Location: [facility] ENDOSCOPY;  Service: Gastroenterology;  Laterality: N/A;   ESOPHAGOGASTRODUODENOSCOPY N/A 11/02/2020   Procedure: ESOPHAGOGASTRODUODENOSCOPY (EGD);  Surgeon: Wilhelmenia Aloha Raddle., MD;  Location: [facility] ENDOSCOPY;  Service: Gastroenterology;  Laterality: N/A;   ESOPHAGOGASTRODUODENOSCOPY (EGD) WITH PROPOFOL  N/A 11/06/2019   Procedure: ESOPHAGOGASTRODUODENOSCOPY (EGD) WITH PROPOFOL ;  Surgeon: Abran Norleen SAILOR, MD;  Location: [facility] ENDOSCOPY;  Service: Endoscopy;  Laterality: N/A;   ESOPHAGOGASTRODUODENOSCOPY (EGD) WITH PROPOFOL  N/A 10/20/2022   Procedure: ESOPHAGOGASTRODUODENOSCOPY (EGD) WITH PROPOFOL ;  Surgeon: Wilhelmenia Aloha Raddle., MD;  Location: [facility] ENDOSCOPY;  Service: Gastroenterology;  Laterality: N/A;   HEMOSTASIS CLIP PLACEMENT  11/02/2019   Procedure: HEMOSTASIS CLIP PLACEMENT;  Surgeon: Abran Norleen SAILOR, MD;  Location: [facility] ENDOSCOPY;  Service: Endoscopy;;   NEUROLYTIC  CELIAC PLEXUS  10/20/2022   Procedure: NEUROLYTIC CELIAC PLEXUS;  Surgeon: Wilhelmenia Aloha Raddle., MD;  Location: [facility] ENDOSCOPY;  Service: Gastroenterology;;   NO PAST SURGERIES     REMOVAL OF STONES  11/02/2020   Procedure: REMOVAL OF STONES;  Surgeon: Wilhelmenia Aloha Raddle., MD;  Location: [facility] ENDOSCOPY;  Service: Gastroenterology;;   CLEDA REMOVAL  11/02/2020   Procedure: STENT REMOVAL;  Surgeon: Wilhelmenia Aloha Raddle., MD;  Location: [facility] ENDOSCOPY;  Service: Gastroenterology;;   UPPER ESOPHAGEAL ENDOSCOPIC ULTRASOUND (EUS) N/A 10/20/2022   Procedure: UPPER ESOPHAGEAL ENDOSCOPIC ULTRASOUND (EUS);  Surgeon: Wilhelmenia Aloha Raddle., MD;  Location: [facility] ENDOSCOPY;  Service: Gastroenterology;  Laterality: N/A;   Social History:  reports that he has been smoking cigarettes. He has never used smokeless tobacco. He reports that he does not currently use alcohol. He reports that he does not use drugs.   Allergies  Allergen Reactions   Norvasc  [Amlodipine  Besylate] Other (See Comments)    Fluid buildup in chest   Voltaren  [Diclofenac ] Hives and Other (See Comments)    Fluid buildup in chest   Prilosec [Omeprazole] Other (See Comments)    MD stopped due to pancreatitis   Hydrochlorothiazide Itching   Ultram  [Tramadol ] Other (See Comments)    Stomach upset   Prednisone  Hives and Other (See Comments)    Mood swings    Family History  Problem Relation Age of Onset   Hypertension Other    Hypertension Mother    Hypertension Father    Kidney disease Father    Hypertension Sister    Diabetes Sister    Hypertension Brother    Pancreatic cancer Paternal Grandmother    Colon cancer Cousin    Stomach cancer Neg Hx    Esophageal cancer Neg Hx     Prior to Admission medications   Medication Sig Start Date End Date Taking? Authorizing Provider  acetaminophen  (TYLENOL ) 500 MG tablet Take 500 mg by mouth every 6 (six) hours as needed.    [provider]  albuterol  (VENTOLIN  HFA) 108 (90  Base) MCG/ACT inhaler Inhale 2 puffs into the lungs every 6 (six) hours as needed for wheezing or shortness of breath.    [provider]  carvedilol  (COREG ) 3.125 MG tablet Take 1 tablet (3.125 mg total) by mouth 2 (two) times daily with a meal. 10/23/22   [name] Toribio GAILS, MD  famotidine  (PEPCID ) 20 MG tablet Take 1 tablet (20 mg total) by mouth 2 (two) times daily. 12/13/23   Heinz, Camie BRAVO, PA-C  gabapentin  (NEURONTIN ) 300 MG capsule Take 300 mg by mouth 3 (three) times daily as needed (Pain).    [provider]  lipase/protease/amylase (CREON ) 36000 UNITS CPEP capsule Take 2 capsules with each meal and 1 capsule with each snack (2 snacks/day) 01/27/24   Nandigam, Kavitha V, MD  naloxone  (NARCAN ) nasal spray 4 mg/0.1 mL Place 1 spray into the nose daily as needed (opioid reversal). 07/16/22   [provider]  Naphazoline-Pheniramine (VISINE OP) Place 1 drop into both eyes daily.    [provider]  Nutritional Supplements (ENSURE ORIGINAL) LIQD Take 1 Container by mouth 2 (two) times daily.    [provider]  promethazine  (PHENERGAN ) 25 MG tablet Take 1 tablet (25 mg total) by mouth every 6 (six) hours as  needed for up to 5 days for nausea or vomiting. 11/29/23 12/13/23  Stoney Blizzard, DO   Physical Exam: Vitals:   01/27/24 0918 01/27/24 0925 01/27/24 1100 01/27/24 1137  BP: 119/81  (!) 134/91   Pulse: (!) 105  90   Resp: 16  16   Temp: 98.1 F (36.7 C)     TempSrc: Oral     SpO2: 94%  99% 98%  Weight:  68 kg    Height:  6' 1 (1.854 m)      General exam: Appears moderately malnourished, lying comfortably supine on the gurney in no obvious distress. Head, eyes and ENT: Nontraumatic and normocephalic.  Moist mucous membrane Neck: Supple. No JVD. Lymphatics: No lymphadenopathy. Respiratory system: Clear to auscultation. No increased work of breathing. Cardiovascular system: S1-S2 no murmurs rubs or gallops no JVD. Gastrointestinal system:  Bowel sounds soft epigastric tenderness and flank tenderness no rebound or guarding. Central nervous system: Alert and oriented. No focal neurological deficits. Extremities: Symmetric 5 x 5 power. Peripheral pulses symmetrically felt.  Skin: No rashes or acute findings. Musculoskeletal system: Negative exam. Psychiatry: Pleasant and cooperative.   Labs on Admission:  Basic Metabolic Panel: Recent Labs  Lab 01/27/24 0957  NA 133*  K 3.6  CL 95*  CO2 20*  GLUCOSE 352*  BUN 11  CREATININE 1.01  CALCIUM 9.2   Liver Function Tests: Recent Labs  Lab 01/27/24 0957  AST 26  ALT 27  ALKPHOS 68  BILITOT 0.6  PROT 6.9  ALBUMIN 4.1   Recent Labs  Lab 01/27/24 0957  LIPASE 29   No results for input(s): AMMONIA in the last 168 hours. CBC: Recent Labs  Lab 01/27/24 0957  WBC 7.4  HGB 12.8*  HCT 38.1*  MCV 101.3*  PLT 143*   Cardiac Enzymes: No results for input(s): CKTOTAL, CKMB, CKMBINDEX, TROPONINI in the last 168 hours.  BNP (last 3 results) No results for input(s): PROBNP in the last 8760 hours. CBG: No results for input(s): GLUCAP in the last 168 hours.  Radiological Exams on Admission: CT ABDOMEN PELVIS W CONTRAST Result Date: 01/27/2024 CLINICAL DATA:  Generalized abdominal pain EXAM: CT ABDOMEN AND PELVIS WITH CONTRAST TECHNIQUE: Multidetector CT imaging of the abdomen and pelvis was performed using the standard protocol following bolus administration of intravenous contrast. RADIATION DOSE REDUCTION: This exam was performed according to the departmental dose-optimization program which includes automated exposure control, adjustment of the mA and/or kV according to patient size and/or use of iterative reconstruction technique. CONTRAST:  OMNIPAQUE  IOHEXOL  300 MG/ML  SOLN COMPARISON:  November 22, 2023 FINDINGS: Lower chest: No acute abnormality. Hepatobiliary: There is diffuse fatty infiltration of the liver parenchyma. No focal liver abnormality  is seen. Status post cholecystectomy. No biliary dilatation. Pancreas: Mild diffuse pancreatic atrophy is noted. No pancreatic ductal dilatation or surrounding inflammatory changes. Spleen: Normal in size without focal abnormality. Adrenals/Urinary Tract: Adrenal glands are unremarkable. The right kidney is atrophic. There is mild compensatory hypertrophy of the left kidney, without renal calculi, focal lesion, or hydronephrosis. Bladder is unremarkable. Stomach/Bowel: Stomach is within normal limits. Appendix appears normal. No evidence of bowel wall thickening, distention, or inflammatory changes. Noninflamed diverticula are seen throughout the large bowel. Vascular/Lymphatic: No significant vascular findings are present. No enlarged abdominal or pelvic lymph nodes. Reproductive: The prostate gland is mildly enlarged. Other: No abdominal wall hernia or abnormality. No abdominopelvic ascites. Musculoskeletal: Degenerative changes are seen at the level of L5-S1. IMPRESSION: 1. Hepatic steatosis. 2. Colonic  diverticulosis. 3. Atrophic right kidney. 4. Mildly enlarged prostate gland. Electronically Signed   By: Suzen Dials M.D.   On: 01/27/2024 11:44    EKG: Independently reviewed. none  Assessment/Plan Upper GI bleed hematemesis/vomiting blood/acute blood loss anemia/normocytic anemia: Will go ahead and fluid resuscitation with specific gravity 1046. Will give him a normal saline bolus and continue him on IV fluids. Start on Protonix  IV twice daily. Type and cross him 2 units. I am sure his hemoglobin will drop after fluid resuscitation repeat a CBC tonight transfuse if symptomatic or hemoglobin less than 7 I believe he is hemoconcentrated. GI has been consulted n.p.o. after midnight for possible endoscopy. Check a B12 and folate MCV is elevated.  Hyperglycemia: Likely reactive, check an A1c. He is currently NPO.  Hypovolemic hyponatremia: Aggressive fluid resuscitate and recheck basic  metabolic panel in the morning.  Chronic pancreatitis (HCC) Continue Creon  tablets.  Sips with med  Essential hypertension: Hold all antihypertensive medications, can resume them tomorrow if his blood pressure continues to go up after hydration can restart Coreg . Hydralazine  IV as needed  Mild protein-calorie malnutrition Noted appears to be a chronic problem.  Thrombocytopenia Actually has improved compared to previous continue to monitor closely.   DVT Prophylaxis: SCD Code Status: Full  Family Communication: none  Disposition Plan: inpatient      It is my clinical opinion that admission to INPATIENT is reasonable and necessary in this 53 y.o. male acute upper GI bleed in a patient with acute on chronic pancreatitis with recent celiac plexus neurolysis.  Given the aforementioned, the predictability of an adverse outcome is felt to be significant. I expect that the patient will require at least 2 midnights in the hospital to treat this condition.  Erle Odell Castor MD [facility]   01/27/2024, 2:12 PM

## 2024-01-28 ENCOUNTER — Inpatient Hospital Stay (HOSPITAL_COMMUNITY): Admitting: Anesthesiology

## 2024-01-28 ENCOUNTER — Encounter (HOSPITAL_COMMUNITY): Payer: Self-pay | Admitting: Internal Medicine

## 2024-01-28 ENCOUNTER — Encounter: Payer: Self-pay | Attending: Internal Medicine

## 2024-01-28 DIAGNOSIS — K209 Esophagitis, unspecified without bleeding: Secondary | ICD-10-CM

## 2024-01-28 DIAGNOSIS — F17210 Nicotine dependence, cigarettes, uncomplicated: Secondary | ICD-10-CM

## 2024-01-28 DIAGNOSIS — K920 Hematemesis: Secondary | ICD-10-CM

## 2024-01-28 DIAGNOSIS — I10 Essential (primary) hypertension: Secondary | ICD-10-CM

## 2024-01-28 HISTORY — PX: ESOPHAGOGASTRODUODENOSCOPY: SHX5428

## 2024-01-28 LAB — IRON AND TIBC
Iron: 101 ug/dL (ref 45–182)
Saturation Ratios: 30 % (ref 17.9–39.5)
TIBC: 336 ug/dL (ref 250–450)
UIBC: 235 ug/dL

## 2024-01-28 LAB — PROTIME-INR
INR: 1 (ref 0.8–1.2)
Prothrombin Time: 13.8 s (ref 11.4–15.2)

## 2024-01-28 LAB — COMPREHENSIVE METABOLIC PANEL WITH GFR
ALT: 22 U/L (ref 0–44)
AST: 22 U/L (ref 15–41)
Albumin: 3.6 g/dL (ref 3.5–5.0)
Alkaline Phosphatase: 55 U/L (ref 38–126)
Anion gap: 7 (ref 5–15)
BUN: 5 mg/dL — ABNORMAL LOW (ref 6–20)
CO2: 27 mmol/L (ref 22–32)
Calcium: 8.4 mg/dL — ABNORMAL LOW (ref 8.9–10.3)
Chloride: 102 mmol/L (ref 98–111)
Creatinine, Ser: 0.77 mg/dL (ref 0.61–1.24)
GFR, Estimated: >60 mL/min (ref >60)
Glucose, Bld: 190 mg/dL — ABNORMAL HIGH (ref 70–99)
Potassium: 3.9 mmol/L (ref 3.5–5.1)
Sodium: 136 mmol/L (ref 135–145)
Total Bilirubin: 0.7 mg/dL (ref 0.0–1.2)
Total Protein: 5.8 g/dL — ABNORMAL LOW (ref 6.5–8.1)

## 2024-01-28 LAB — CBC
HCT: 35.6 % — ABNORMAL LOW (ref 39.0–52.0)
Hemoglobin: 11.7 g/dL — ABNORMAL LOW (ref 13.0–17.0)
MCH: 34.1 pg — ABNORMAL HIGH (ref 26.0–34.0)
MCHC: 32.9 g/dL (ref 30.0–36.0)
MCV: 103.8 fL — ABNORMAL HIGH (ref 80.0–100.0)
Platelets: 131 K/uL — ABNORMAL LOW (ref 150–400)
RBC: 3.43 MIL/uL — ABNORMAL LOW (ref 4.22–5.81)
RDW: 14 % (ref 11.5–15.5)
WBC: 6.7 K/uL (ref 4.0–10.5)
nRBC: 0 % (ref 0.0–0.2)

## 2024-01-28 LAB — FERRITIN: Ferritin: 193 ng/mL (ref 24–336)

## 2024-01-28 LAB — GLUCOSE, CAPILLARY
Glucose-Capillary: 147 mg/dL — ABNORMAL HIGH (ref 70–99)
Glucose-Capillary: 122 mg/dL — ABNORMAL HIGH (ref 70–99)
Glucose-Capillary: 169 mg/dL — ABNORMAL HIGH (ref 70–99)

## 2024-01-28 LAB — VITAMIN B12: Vitamin B-12: 230 pg/mL (ref 180–914)

## 2024-01-28 SURGERY — EGD (ESOPHAGOGASTRODUODENOSCOPY)
Anesthesia: Monitor Anesthesia Care

## 2024-01-28 MED ORDER — PROMETHAZINE HCL 25 MG PO TABS
25.0000 mg | ORAL_TABLET | Freq: Once | ORAL | Status: AC | PRN
  Administered 2024-01-28: 25 mg via ORAL
  Filled 2024-01-28: qty 1

## 2024-01-28 MED ORDER — DEXMEDETOMIDINE HCL IN NACL 80 MCG/20ML IV SOLN
INTRAVENOUS | Status: DC | PRN
  Administered 2024-01-28: 8 ug via INTRAVENOUS

## 2024-01-28 MED ORDER — PROPOFOL 10 MG/ML IV BOLUS
INTRAVENOUS | Status: DC | PRN
  Administered 2024-01-28: 20 mg via INTRAVENOUS
  Administered 2024-01-28: 30 mg via INTRAVENOUS

## 2024-01-28 MED ORDER — SODIUM CHLORIDE 0.9 % IV SOLN
INTRAVENOUS | Status: DC | PRN
Start: 2024-01-28 — End: 2024-01-28

## 2024-01-28 MED ORDER — FAMOTIDINE 20 MG PO TABS
40.0000 mg | ORAL_TABLET | Freq: Two times a day (BID) | ORAL | Status: DC
  Administered 2024-01-28 – 2024-01-29 (×2): 40 mg via ORAL
  Filled 2024-01-28 (×2): qty 2

## 2024-01-28 MED ORDER — ONDANSETRON HCL 4 MG/2ML IJ SOLN
4.0000 mg | Freq: Once | INTRAMUSCULAR | Status: AC
  Administered 2024-01-28: 4 mg via INTRAVENOUS
  Filled 2024-01-28: qty 2

## 2024-01-28 MED ORDER — ONDANSETRON HCL 4 MG/2ML IJ SOLN
4.0000 mg | Freq: Four times a day (QID) | INTRAMUSCULAR | Status: DC | PRN
  Administered 2024-01-28: 4 mg via INTRAVENOUS
  Filled 2024-01-28: qty 2

## 2024-01-28 MED ORDER — PROPOFOL 500 MG/50ML IV EMUL
INTRAVENOUS | Status: DC | PRN
  Administered 2024-01-28: 180 ug/kg/min via INTRAVENOUS

## 2024-01-28 MED ORDER — INSULIN ASPART 100 UNIT/ML IJ SOLN
0.0000 [IU] | INTRAMUSCULAR | Status: DC
  Administered 2024-01-28: 3 [IU] via SUBCUTANEOUS
  Administered 2024-01-28 – 2024-01-29 (×3): 2 [IU] via SUBCUTANEOUS
  Administered 2024-01-29 (×2): 3 [IU] via SUBCUTANEOUS

## 2024-01-28 NOTE — Anesthesia Preprocedure Evaluation (Signed)
 Anesthesia Evaluation  Patient identified by MRN, date of birth, ID band Patient awake    Reviewed: Allergy & Precautions, NPO status , Patient's Chart, lab work & pertinent test results  History of Anesthesia Complications Negative for: history of anesthetic complications  Airway Mallampati: II  TM Distance: >3 FB Neck ROM: Full    Dental no notable dental hx. (+) Teeth Intact,    Pulmonary neg sleep apnea, neg COPD, Patient abstained from smoking.Not current smoker, former smoker   Pulmonary exam normal breath sounds clear to auscultation       Cardiovascular Exercise Tolerance: Good METShypertension, Pt. on medications (-) CAD and (-) Past MI Normal cardiovascular exam(-) dysrhythmias  Rhythm:Regular Rate:Normal - Systolic murmurs    Neuro/Psych  Headaches  negative psych ROS   GI/Hepatic PUD,GERD  Medicated and Controlled,,(+)     substance abuse  Hematemesis 2 days ago. No vomiting today. Denies nausea currently.   Endo/Other  neg diabetes  Recurrent acute pancreatitis  Renal/GU Renal disease     Musculoskeletal negative musculoskeletal ROS (+)    Abdominal   Peds  Hematology  (+) Blood dyscrasia, anemia   Anesthesia Other Findings Past Medical History: No date: Atrophy of right kidney No date: Chronic bronchitis (HCC) No date: Chronic lower back pain No date: Chronic pancreatitis (HCC) 06/2020: Gastroparesis     Comment:  confirmed by emptying study at [facility] No date: GERD (gastroesophageal reflux disease) No date: Headache     Comment:  once/month (05/13/2017) 10/18/2017: Hematemesis 09/19/2021: Hematochezia No date: High cholesterol No date: Hypertension No date: Migraine     Comment:  a couple/year (05/13/2017) 05/13/2017: Nephrolithiasis No date: Pancreatitis ~ 09/2015: Pneumonia No date: Presence of pancreatic duct stent No date: Recurrent acute pancreatitis 05/22/2021: Shortness of  breath  Reproductive/Obstetrics                              Anesthesia Physical Anesthesia Plan  ASA: 3  Anesthesia Plan: MAC   Post-op Pain Management: Minimal or no pain anticipated   Induction: Intravenous  PONV Risk Score and Plan: 1 and Propofol  infusion and Treatment may vary due to age or medical condition  Airway Management Planned: Nasal Cannula  Additional Equipment: None  Intra-op Plan:   Post-operative Plan:   Informed Consent: I have reviewed the patients History and Physical, chart, labs and discussed the procedure including the risks, benefits and alternatives for the proposed anesthesia with the patient or authorized representative who has indicated his/her understanding and acceptance.     Dental advisory given  Plan Discussed with: CRNA  Anesthesia Plan Comments: (Discussed risks of anesthesia with patient, including possibility of difficulty with spontaneous ventilation under anesthesia necessitating airway intervention, PONV, and rare risks such as cardiac or respiratory or neurological events, and allergic reactions. Discussed the role of CRNA in patient's perioperative care. Patient understands.)        Anesthesia Quick Evaluation

## 2024-01-28 NOTE — H&P (View-Only) (Signed)
 [facility] Progress Note  CC:  Hematemesis   Subjective: He denies having any N/V. He endorses having mild epigastric and LUQ pain, well controlled at this time. He passed a small brown stool this morning. No rectal bleeding or melena. No CP or SOB. He remains NPO for possible EGD this afternoon.   Objective:  Vital signs in last 24 hours: Temp:  [97.7 F (36.5 C)-98.1 F (36.7 C)] 98 F (36.7 C) (10/14 0536) Pulse Rate:  [72-85] 72 (10/14 0536) Resp:  [16] 16 (10/13 1827) BP: (136-159)/(92-125) 158/99 (10/14 0817) SpO2:  [97 %-100 %] 100 % (10/14 0536) Last BM Date : 01/28/24 General: Alert 53 year old male in no acute distress. Heart: RRR, no murmurs.  Pulm: Breath sounds clear throughout. On room air.  Abdomen: Soft, nondistended. Mild epigastric and LUQ tenderness without rebound or guarding.  Extremities: No lower extremity edema. Neurologic:  Alert and oriented x 4. Grossly normal neurologically. Psych:  Alert and cooperative. Normal mood and affect.  Intake/Output from previous day: 10/13 0701 - 10/14 0700 In: 2446.7 [P.O.:120; I.V.:1064.7; IV Piggyback:1262] Out: -  Intake/Output this shift: No intake/output data recorded.  Lab Results: Recent Labs    01/27/24 0957 01/27/24 2049 01/28/24 0440  WBC 7.4 6.4 6.7  HGB 12.8* 12.0* 11.7*  HCT 38.1* 36.0* 35.6*  PLT 143* 139* 131*   BMET Recent Labs    01/27/24 0957 01/28/24 0440  NA 133* 136  K 3.6 3.9  CL 95* 102  CO2 20* 27  GLUCOSE 352* 190*  BUN 11 5*  CREATININE 1.01 0.77  CALCIUM 9.2 8.4*   LFT Recent Labs    01/28/24 0440  PROT 5.8*  ALBUMIN 3.6  AST 22  ALT 22  ALKPHOS 55  BILITOT 0.7   PT/INR Recent Labs    01/28/24 0440  LABPROT 13.8  INR 1.0   Hepatitis Panel No results for input(s): HEPBSAG, HCVAB, HEPAIGM, HEPBIGM in the last 72 hours.  CT ABDOMEN PELVIS W CONTRAST Result Date: 01/27/2024 CLINICAL DATA:  Generalized abdominal pain EXAM: CT  ABDOMEN AND PELVIS WITH CONTRAST TECHNIQUE: Multidetector CT imaging of the abdomen and pelvis was performed using the standard protocol following bolus administration of intravenous contrast. RADIATION DOSE REDUCTION: This exam was performed according to the departmental dose-optimization program which includes automated exposure control, adjustment of the mA and/or kV according to patient size and/or use of iterative reconstruction technique. CONTRAST:  OMNIPAQUE  IOHEXOL  300 MG/ML  SOLN COMPARISON:  November 22, 2023 FINDINGS: Lower chest: No acute abnormality. Hepatobiliary: There is diffuse fatty infiltration of the liver parenchyma. No focal liver abnormality is seen. Status post cholecystectomy. No biliary dilatation. Pancreas: Mild diffuse pancreatic atrophy is noted. No pancreatic ductal dilatation or surrounding inflammatory changes. Spleen: Normal in size without focal abnormality. Adrenals/Urinary Tract: Adrenal glands are unremarkable. The right kidney is atrophic. There is mild compensatory hypertrophy of the left kidney, without renal calculi, focal lesion, or hydronephrosis. Bladder is unremarkable. Stomach/Bowel: Stomach is within normal limits. Appendix appears normal. No evidence of bowel wall thickening, distention, or inflammatory changes. Noninflamed diverticula are seen throughout the large bowel. Vascular/Lymphatic: No significant vascular findings are present. No enlarged abdominal or pelvic lymph nodes. Reproductive: The prostate gland is mildly enlarged. Other: No abdominal wall hernia or abnormality. No abdominopelvic ascites. Musculoskeletal: Degenerative changes are seen at the level of L5-S1. IMPRESSION: 1. Hepatic steatosis. 2. Colonic diverticulosis. 3. Atrophic right kidney. 4. Mildly enlarged prostate gland. Electronically Signed   By: Suzen  Houston M.D.   On: 01/27/2024 11:44    Patient Profile:  Kyle Berry is a 53 y.o. male with a past medical history of  hypertension, chronic lower back pain, kidney stones, migraine headaches, hepatic steatosis, GERD, diverticulosis, colon polyps, recurrent acute pancreatitis and chronic pancreatitis status post ERCP with pancreatic duct stent placement 08/2020. S/P cholecystectomy 2019.   Assessment / Plan:  53 year old male with history of GERD presents with hematemesis x and upper abdominal pain which radiates around to the mid back x 3 days.  No NSAID use.  INR 1.0.  No further hematemesis since admission. Hemodynamically stable. - NPO for possible EGD today if time/anesthesiologist available in endo otherwise will be tomorrow. EGD benefits and risks discussed including risk with sedation, risk of bleeding, perforation and infection  - IV fluids per the hospitalist  - Continue Pantoprazole  40mg  IV bid - Ondansetron  4 mg PR IV every 6 hours as needed   Chronic upper abdominal pain secondary to acute and chronic pancreatitis status post celiac plexus block 11/28/2023, 12/30/2023 and 01/22/2024. Chronic abdominal pain significantly improved following 11/28/2023 celiac plexus block, abdominal pain recurred on 10/11. CTAP showed mild diffuse pancreatic atrophy without pancreatic ductal dilatation or surrounding inflammatory changes. Normal LFTs and lipase level. Epigastric and LUQ pain well controlled. - Pain management per the hospitalist - Start Creon  when patient resumes a solid diet, currently NPO   Mild macrocytic anemia. Hg 12.8 (Hg 12.0 - 14.4 one month ago) -> Hg 11.7. MCV 103.8.  Iron 101.  Ferritin 193.  Vitamin B12 level 230.  - See plan above - Transfuse for Hg level < 8 or as needed if symptomatic  - Recommend vitamin B12 supplementation due to low normal B12 level   Bright red blood per the rectum x 3 episodes within the past week without associated lower abdominal or rectal pain.  Likely hemorrhoidal. Patient passed a brown stool this morning without blood.  - Continue to monitor the patient for GI  bleeding - Eventual colonoscopy, likely as an outpatient   Thrombocytopenia, etiology unknown. PLT 131. CTAP showed hepatic steatosis without cirrhosis and no splenomegaly.    History of colon polyps.  Colonoscopy 10/30/2019 identified four polyps, two large polyps measuring 20 and 30 mm, all removed from the colon. Path report consistent with tubular adenomas.  He developed a post polypectomy GI bleed and underwent a repeat colonoscopy 7/19/202 and 2 clips were placed at the large cecal and rectosigmoid polypectomy sites. - Eventual colonoscopy, likely as an outpatient     Principal Problem:   Hematemesis/vomiting blood Active Problems:   Chronic pancreatitis (HCC)   Essential hypertension   Mild protein-calorie malnutrition   Thrombocytopenia   Acute upper GI bleed     LOS: 1 day   Elida CHRISTELLA Shawl  01/28/2024, 12:45 PM

## 2024-01-28 NOTE — Transfer of Care (Signed)
 Immediate Anesthesia Transfer of Care Note  Patient: Kyle Berry  Procedure(s) Performed: EGD (ESOPHAGOGASTRODUODENOSCOPY)  Patient Location: Endoscopy Unit  Anesthesia Type:MAC  Level of Consciousness: sedated  Airway & Oxygen Therapy: Patient Spontanous Breathing and Patient connected to face mask oxygen  Post-op Assessment: Report given to RN and Post -op Vital signs reviewed and stable  Post vital signs: Reviewed and stable  Last Vitals:  Vitals Value Taken Time  BP 108/79 01/28/24 15:58  Temp    Pulse 70 01/28/24 15:59  Resp 9 01/28/24 15:59  SpO2 99 % 01/28/24 15:59  Vitals shown include unfiled device data.  Last Pain:  Vitals:   01/28/24 1439  TempSrc: Temporal  PainSc: 7          Complications: No notable events documented.

## 2024-01-28 NOTE — Progress Notes (Signed)
 [facility] PROGRESS NTE    Progress Note  Kyle Berry  FMW:[id number] DOB: 04/05/1971 DOA: 01/27/2024 PCP: [facility]     Brief Narrative:   Kyle Berry is an 53 y.o. male past medical history of essential hypertension, chronic alcoholic pancreatitis, hepatic steatosis status post ERCP with pancreatic duct stenting in 2022, multiple CT-guided celiac plexus block last 1 on 12/30/2022 with a celiac plexus neurolysis on 01/15/2024 comes in for abdominal pain and hematemesis that started 5 days prior to admission   Assessment/Plan:   Upper GI bleed acute blood loss anemia/ Hematemesis/vomiting blood Last hemoglobin 12/27/2023 was 14, this morning is 11.7. Started on IV fluids type and cross him 2 units of packed red blood cells. Was placed n.p.o. GI was consulted recommended an EGD for 01/28/2024. No further episodes of bleeding this morning.  Hyperglycemia/newly diagnosed diabetes mellitus type 2: Likely reactive, A1c of 9.7. Will start him on sliding scale insulin .  CBGs every 4 he is currently NPO.  Hypovolemic hyponatremia: Resolved with IV fluids.  Chronic pancreatitis: Sips with meds continue Creon .  Essential hypertension: All antihypertensive medications were held on admission he was started on IV fluids his blood pressure is borderline elevated. Can restart post EGD.  Mild protein caloric malnutrition: Noted.  Thrombocytopenia: Noted.  DVT prophylaxis: SCD's Family Communication:none Status is: Inpatient Remains inpatient appropriate because: Upper GI bleed    Code Status:     Code Status Orders  (From admission, onward)           Start     Ordered   01/27/24 1429  Full code  Continuous       Question:  By:  Answer:  Consent: discussion documented in EHR   01/27/24 1432           Code Status History     Date Active Date Inactive Code Status Order ID Comments User Context   01/15/2024 1233  01/16/2024 0516 Full Code [id number]  Hughes Simmonds, MD [facility]   12/26/2023 2148 12/31/2023 1540 Full Code [id number]  Tobie Jorie SAUNDERS, MD ED   11/18/2023 1531 11/30/2023 2156 Full Code [id number]  Suzen Houston NOVAK, DO ED   03/23/2023 2125 03/27/2023 1542 Full Code [id number]  Silvester Ales, MD ED   01/12/2023 1538 01/15/2023 1729 Full Code [id number]  Seena Marsa NOVAK, MD ED   10/12/2022 2200 10/23/2022 1825 Full Code [id number]  Dena Charleston, MD ED   09/24/2022 1226 09/28/2022 1745 Full Code [id number]  Marca Maude POUR, MD ED   05/05/2022 0800 05/11/2022 1641 Full Code [id number]  Patsy Lenis, MD ED   03/05/2022 1308 03/07/2022 2043 Full Code [id number]  Barbarann Nest, MD ED   01/24/2022 0916 01/27/2022 0552 Full Code [id number]  Barbarann Nest, MD ED   10/25/2021 1558 10/27/2021 1759 Full Code [id number]  Britta Lacks, MD ED   09/19/2021 1543 09/26/2021 1417 Full Code [id number]  Claudene Maximino LABOR, MD ED   08/09/2021 1542 08/12/2021 1837 Full Code [id number]  Masters, Izetta, DO ED   06/21/2021 1847 06/24/2021 2155 Full Code [id number]  Juvenal Harlene PENNER, DO Inpatient   04/21/2021 2011 04/27/2021 0156 Full Code [id number]  Arnett Saunders, MD ED   02/01/2021 0535 02/04/2021 1952 Full Code [id number]  Laurence Locus, DO ED   02/01/2021 0506 02/01/2021 0535 Full Code [id number]  Laurence Locus, DO ED   12/23/2020 1327 12/31/2020 1856 Full Code [id number]  Barbarann Nest, MD ED   11/17/2020 0358 11/18/2020 2054 Full Code [id number]  Lonzell Emeline HERO, DO ED   10/31/2020 0551 11/03/2020 1951 Full Code [id number]  Shona Terry SAILOR, DO ED   05/29/2020 0959 06/03/2020 1627 Full Code [id number]  Selene Henderson, MD ED   04/12/2020 1559 04/15/2020 2219 Full Code [id number]  Laurita Cort DASEN, MD ED   01/29/2020 0815 02/02/2020 1646 Full Code [id number]  Barbarann Nest, MD ED   11/05/2019 1940 11/06/2019 2215 Full Code [id number]  Tobie Jorie SAUNDERS, MD ED   11/02/2019 0323 11/02/2019 2012 Full Code [id number]  Charlton Evalene RAMAN, MD ED   10/14/2019 1637 10/16/2019 1724  Full Code [id number]  Vernon Ranks, MD ED   08/21/2019 0540 08/28/2019 1838 Full Code [id number]  Franky Redia SAILOR, MD ED   05/27/2019 1300 06/03/2019 1717 Full Code [id number]  Laurita Cort DASEN, MD ED   10/17/2018 0621 10/24/2018 1854 Full Code [id number]  Alfornia Madison, MD Inpatient   07/23/2018 0540 07/27/2018 1608 Full Code [id number]  Alfornia Madison, MD ED   04/06/2018 0908 04/13/2018 1441 Full Code [id number]  Francesco Elsie NOVAK, MD ED   10/18/2017 2315 10/24/2017 1541 Full Code [id number]  Silvester Ales, MD Inpatient   10/12/2017 0002 10/14/2017 2205 Full Code [id number]  Hilma Rankins, MD ED   08/25/2017 1514 08/29/2017 1631 Full Code [id number]  Caleen Qualia, MD ED   05/13/2017 0756 05/16/2017 1607 Full Code [id number]  Alto Isaiah CROME, NP ED   01/17/2017 0455 01/20/2017 1643 Full Code [id number]  Lonzell Emeline HERO, DO ED   11/06/2016 1436 11/09/2016 1833 Full Code [id number]  Prentiss Dorothyann Maxwell, DO ED   08/25/2016 1851 08/30/2016 2114 Full Code [id number]  Kathrin Mignon DASEN, MD Inpatient   06/25/2016 0005 06/27/2016 1713 Full Code [id number]  Franky Redia SAILOR, MD ED   11/23/2015 1901 11/26/2015 1750 Full Code [id number]  Doy Neville MATSU, MD Inpatient         IV Access:   Peripheral IV   Procedures and diagnostic studies:   CT ABDOMEN PELVIS W CONTRAST Result Date: 01/27/2024 CLINICAL DATA:  Generalized abdominal pain EXAM: CT ABDOMEN AND PELVIS WITH CONTRAST TECHNIQUE: Multidetector CT imaging of the abdomen and pelvis was performed using the standard protocol following bolus administration of intravenous contrast. RADIATION DOSE REDUCTION: This exam was performed according to the departmental dose-optimization program which includes automated exposure control, adjustment of the mA and/or kV according to patient size and/or use of iterative reconstruction technique. CONTRAST:  OMNIPAQUE  IOHEXOL  300 MG/ML  SOLN COMPARISON:  November 22, 2023 FINDINGS: Lower chest: No acute abnormality.  Hepatobiliary: There is diffuse fatty infiltration of the liver parenchyma. No focal liver abnormality is seen. Status post cholecystectomy. No biliary dilatation. Pancreas: Mild diffuse pancreatic atrophy is noted. No pancreatic ductal dilatation or surrounding inflammatory changes. Spleen: Normal in size without focal abnormality. Adrenals/Urinary Tract: Adrenal glands are unremarkable. The right kidney is atrophic. There is mild compensatory hypertrophy of the left kidney, without renal calculi, focal lesion, or hydronephrosis. Bladder is unremarkable. Stomach/Bowel: Stomach is within normal limits. Appendix appears normal. No evidence of bowel wall thickening, distention, or inflammatory changes. Noninflamed diverticula are seen throughout the large bowel. Vascular/Lymphatic: No significant vascular findings are present. No enlarged abdominal or pelvic lymph nodes. Reproductive: The prostate gland is mildly enlarged. Other: No abdominal wall hernia or abnormality. No abdominopelvic ascites. Musculoskeletal: Degenerative changes are seen at the level of L5-S1. IMPRESSION: 1. Hepatic steatosis. 2. Colonic diverticulosis. 3. Atrophic right kidney. 4. Mildly enlarged prostate gland. Electronically Signed   By: Suzen Dwane HERO.D.  On: 01/27/2024 11:44     Medical Consultants:   None.   Subjective:    Ellaree LITTIE Niece pain is controlled with analgesics.  Objective:    Vitals:   01/27/24 1827 01/27/24 2235 01/28/24 0536 01/28/24 0817  BP: (!) 157/95 (!) 159/125 (!) 136/92 (!) 158/99  Pulse: 77 85 72   Resp: 16     Temp: 98.1 F (36.7 C) 98.1 F (36.7 C) 98 F (36.7 C)   TempSrc: Oral Oral Oral   SpO2: 100% 97% 100%   Weight:      Height:       SpO2: 100 %   Intake/Output Summary (Last 24 hours) at 01/28/2024 1044 Last data filed at 01/28/2024 0328 Gross per 24 hour  Intake 2446.66 ml  Output --  Net 2446.66 ml   Filed Weights   01/27/24 0925  Weight: 68 kg     Exam: General exam: In no acute distress. Respiratory system: Good air movement and clear to auscultation. Cardiovascular system: S1 & S2 heard, RRR. No JVD. Gastrointestinal system: Abdomen is nondistended, soft and nontender.  Extremities: No pedal edema. Skin: No rashes, lesions or ulcers Psychiatry: Judgement and insight appear normal. Mood & affect appropriate.    Data Reviewed:    Labs: Basic Metabolic Panel: Recent Labs  Lab 01/27/24 0957 01/28/24 0440  NA 133* 136  K 3.6 3.9  CL 95* 102  CO2 20* 27  GLUCOSE 352* 190*  BUN 11 5*  CREATININE 1.01 0.77  CALCIUM 9.2 8.4*   GFR Estimated Creatinine Clearance: 101.5 mL/min (by C-G formula based on SCr of 0.77 mg/dL). Liver Function Tests: Recent Labs  Lab 01/27/24 0957 01/28/24 0440  AST 26 22  ALT 27 22  ALKPHOS 68 55  BILITOT 0.6 0.7  PROT 6.9 5.8*  ALBUMIN 4.1 3.6   Recent Labs  Lab 01/27/24 0957  LIPASE 29   No results for input(s): AMMONIA in the last 168 hours. Coagulation profile Recent Labs  Lab 01/28/24 0440  INR 1.0   COVID-19 Labs  Recent Labs    01/28/24 0440  FERRITIN 193    Lab Results  Component Value Date   SARSCOV2NAA NEGATIVE 04/21/2021   SARSCOV2NAA NEGATIVE 02/01/2021   SARSCOV2NAA NEGATIVE 12/23/2020   SARSCOV2NAA POSITIVE (A) 11/17/2020    CBC: Recent Labs  Lab 01/27/24 0957 01/27/24 2049 01/28/24 0440  WBC 7.4 6.4 6.7  HGB 12.8* 12.0* 11.7*  HCT 38.1* 36.0* 35.6*  MCV 101.3* 102.9* 103.8*  PLT 143* 139* 131*   Cardiac Enzymes: No results for input(s): CKTOTAL, CKMB, CKMBINDEX, TROPONINI in the last 168 hours. BNP (last 3 results) No results for input(s): PROBNP in the last 8760 hours. CBG: No results for input(s): GLUCAP in the last 168 hours. D-Dimer: No results for input(s): DDIMER in the last 72 hours. Hgb A1c: Recent Labs    01/27/24 1551  HGBA1C 9.7*   Lipid Profile: No results for input(s): CHOL, HDL, LDLCALC,  TRIG, CHOLHDL, LDLDIRECT in the last 72 hours. Thyroid function studies: No results for input(s): TSH, T4TOTAL, T3FREE, THYROIDAB in the last 72 hours.  Invalid input(s): FREET3 Anemia work up: Recent Labs    01/28/24 0440  VITAMINB12 230  FERRITIN 193  TIBC 336  IRON 101   Sepsis Labs: Recent Labs  Lab 01/27/24 0957 01/27/24 2049 01/28/24 0440  WBC 7.4 6.4 6.7   Microbiology No results found for this or any previous visit (from the past 240 hours).   Medications:  carvedilol   3.125 mg Oral BID WC   lipase/protease/amylase  36,000 Units Oral TID AC   pantoprazole  (PROTONIX ) IV  40 mg Intravenous Q12H   sodium chloride  flush  3 mL Intravenous Q12H   Continuous Infusions:    LOS: 1 day   Erle Odell Castor  [facility]  01/28/2024, 10:44 AM

## 2024-01-28 NOTE — Progress Notes (Signed)
 [facility] Progress Note  CC:  Hematemesis   Subjective: He denies having any N/V. He endorses having mild epigastric and LUQ pain, well controlled at this time. He passed a small brown stool this morning. No rectal bleeding or melena. No CP or SOB. He remains NPO for possible EGD this afternoon.   Objective:  Vital signs in last 24 hours: Temp:  [97.7 F (36.5 C)-98.1 F (36.7 C)] 98 F (36.7 C) (10/14 0536) Pulse Rate:  [72-85] 72 (10/14 0536) Resp:  [16] 16 (10/13 1827) BP: (136-159)/(92-125) 158/99 (10/14 0817) SpO2:  [97 %-100 %] 100 % (10/14 0536) Last BM Date : 01/28/24 General: Alert 53 year old male in no acute distress. Heart: RRR, no murmurs.  Pulm: Breath sounds clear throughout. On room air.  Abdomen: Soft, nondistended. Mild epigastric and LUQ tenderness without rebound or guarding.  Extremities: No lower extremity edema. Neurologic:  Alert and oriented x 4. Grossly normal neurologically. Psych:  Alert and cooperative. Normal mood and affect.  Intake/Output from previous day: 10/13 0701 - 10/14 0700 In: 2446.7 [P.O.:120; I.V.:1064.7; IV Piggyback:1262] Out: -  Intake/Output this shift: No intake/output data recorded.  Lab Results: Recent Labs    01/27/24 0957 01/27/24 2049 01/28/24 0440  WBC 7.4 6.4 6.7  HGB 12.8* 12.0* 11.7*  HCT 38.1* 36.0* 35.6*  PLT 143* 139* 131*   BMET Recent Labs    01/27/24 0957 01/28/24 0440  NA 133* 136  K 3.6 3.9  CL 95* 102  CO2 20* 27  GLUCOSE 352* 190*  BUN 11 5*  CREATININE 1.01 0.77  CALCIUM 9.2 8.4*   LFT Recent Labs    01/28/24 0440  PROT 5.8*  ALBUMIN 3.6  AST 22  ALT 22  ALKPHOS 55  BILITOT 0.7   PT/INR Recent Labs    01/28/24 0440  LABPROT 13.8  INR 1.0   Hepatitis Panel No results for input(s): HEPBSAG, HCVAB, HEPAIGM, HEPBIGM in the last 72 hours.  CT ABDOMEN PELVIS W CONTRAST Result Date: 01/27/2024 CLINICAL DATA:  Generalized abdominal pain EXAM: CT  ABDOMEN AND PELVIS WITH CONTRAST TECHNIQUE: Multidetector CT imaging of the abdomen and pelvis was performed using the standard protocol following bolus administration of intravenous contrast. RADIATION DOSE REDUCTION: This exam was performed according to the departmental dose-optimization program which includes automated exposure control, adjustment of the mA and/or kV according to patient size and/or use of iterative reconstruction technique. CONTRAST:  OMNIPAQUE  IOHEXOL  300 MG/ML  SOLN COMPARISON:  November 22, 2023 FINDINGS: Lower chest: No acute abnormality. Hepatobiliary: There is diffuse fatty infiltration of the liver parenchyma. No focal liver abnormality is seen. Status post cholecystectomy. No biliary dilatation. Pancreas: Mild diffuse pancreatic atrophy is noted. No pancreatic ductal dilatation or surrounding inflammatory changes. Spleen: Normal in size without focal abnormality. Adrenals/Urinary Tract: Adrenal glands are unremarkable. The right kidney is atrophic. There is mild compensatory hypertrophy of the left kidney, without renal calculi, focal lesion, or hydronephrosis. Bladder is unremarkable. Stomach/Bowel: Stomach is within normal limits. Appendix appears normal. No evidence of bowel wall thickening, distention, or inflammatory changes. Noninflamed diverticula are seen throughout the large bowel. Vascular/Lymphatic: No significant vascular findings are present. No enlarged abdominal or pelvic lymph nodes. Reproductive: The prostate gland is mildly enlarged. Other: No abdominal wall hernia or abnormality. No abdominopelvic ascites. Musculoskeletal: Degenerative changes are seen at the level of L5-S1. IMPRESSION: 1. Hepatic steatosis. 2. Colonic diverticulosis. 3. Atrophic right kidney. 4. Mildly enlarged prostate gland. Electronically Signed   By: Suzen  Houston M.D.   On: 01/27/2024 11:44    Patient Profile:  Kyle Berry is a 53 y.o. male with a past medical history of  hypertension, chronic lower back pain, kidney stones, migraine headaches, hepatic steatosis, GERD, diverticulosis, colon polyps, recurrent acute pancreatitis and chronic pancreatitis status post ERCP with pancreatic duct stent placement 08/2020. S/P cholecystectomy 2019.   Assessment / Plan:  53 year old male with history of GERD presents with hematemesis x and upper abdominal pain which radiates around to the mid back x 3 days.  No NSAID use.  INR 1.0.  No further hematemesis since admission. Hemodynamically stable. - NPO for possible EGD today if time/anesthesiologist available in endo otherwise will be tomorrow. EGD benefits and risks discussed including risk with sedation, risk of bleeding, perforation and infection  - IV fluids per the hospitalist  - Continue Pantoprazole  40mg  IV bid - Ondansetron  4 mg PR IV every 6 hours as needed   Chronic upper abdominal pain secondary to acute and chronic pancreatitis status post celiac plexus block 11/28/2023, 12/30/2023 and 01/22/2024. Chronic abdominal pain significantly improved following 11/28/2023 celiac plexus block, abdominal pain recurred on 10/11. CTAP showed mild diffuse pancreatic atrophy without pancreatic ductal dilatation or surrounding inflammatory changes. Normal LFTs and lipase level. Epigastric and LUQ pain well controlled. - Pain management per the hospitalist - Start Creon  when patient resumes a solid diet, currently NPO   Mild macrocytic anemia. Hg 12.8 (Hg 12.0 - 14.4 one month ago) -> Hg 11.7. MCV 103.8.  Iron 101.  Ferritin 193.  Vitamin B12 level 230.  - See plan above - Transfuse for Hg level < 8 or as needed if symptomatic  - Recommend vitamin B12 supplementation due to low normal B12 level   Bright red blood per the rectum x 3 episodes within the past week without associated lower abdominal or rectal pain.  Likely hemorrhoidal. Patient passed a brown stool this morning without blood.  - Continue to monitor the patient for GI  bleeding - Eventual colonoscopy, likely as an outpatient   Thrombocytopenia, etiology unknown. PLT 131. CTAP showed hepatic steatosis without cirrhosis and no splenomegaly.    History of colon polyps.  Colonoscopy 10/30/2019 identified four polyps, two large polyps measuring 20 and 30 mm, all removed from the colon. Path report consistent with tubular adenomas.  He developed a post polypectomy GI bleed and underwent a repeat colonoscopy 7/19/202 and 2 clips were placed at the large cecal and rectosigmoid polypectomy sites. - Eventual colonoscopy, likely as an outpatient     Principal Problem:   Hematemesis/vomiting blood Active Problems:   Chronic pancreatitis (HCC)   Essential hypertension   Mild protein-calorie malnutrition   Thrombocytopenia   Acute upper GI bleed     LOS: 1 day   Elida CHRISTELLA Shawl  01/28/2024, 12:45 PM

## 2024-01-28 NOTE — Progress Notes (Signed)
   01/28/24 0840  TOC Brief Assessment  Insurance and Status Reviewed  Patient has primary care physician Yes  Home environment has been reviewed single family home  Prior level of function: independent  Prior/Current Home Services No current home services  Social Drivers of Health Review SDOH reviewed no interventions necessary  Readmission risk has been reviewed Yes  Transition of care needs no transition of care needs at this time    Signed: Heather Saltness, MSW, LCSW Clinical Social Worker Inpatient Care Management 01/28/2024 8:41 AM

## 2024-01-28 NOTE — Interval H&P Note (Signed)
 History and Physical Interval Note:  01/28/2024 2:54 PM  Ellaree LITTIE Niece  has presented today for surgery, with the diagnosis of hematemesis.  The various methods of treatment have been discussed with the patient and family. After consideration of risks, benefits and other options for treatment, the patient has consented to  Procedure(s): EGD (ESOPHAGOGASTRODUODENOSCOPY) (N/A) as a surgical intervention.  The patient's history has been reviewed, patient examined, no change in status, stable for surgery.  I have reviewed the patient's chart and labs.  Questions were answered to the patient's satisfaction.     Lupita Commander

## 2024-01-28 NOTE — Op Note (Signed)
 [facility] Patient Name: Kyle Berry Procedure Date: 01/28/2024 MRN: [id number] Attending MD: Lupita FORBES Commander , MD, [id number] Date of Birth: 1971/01/06 CSN: [id number] Age: 53 Admit Type: Inpatient Procedure:                Upper GI endoscopy Indications:              Hematemesis Providers:                Lupita CHARLENA Commander, MD, Ritta Debbie Alert, RN,                            Curtistine Bishop, Technician Referring MD:              Medicines:                Monitored Anesthesia Care Complications:            No immediate complications. Estimated Blood Loss:     Estimated blood loss: none. Procedure:                Pre-Anesthesia Assessment:                           - Prior to the procedure, a History and Physical                            was performed, and patient medications and                            allergies were reviewed. The patient's tolerance of                            previous anesthesia was also reviewed. The risks                            and benefits of the procedure and the sedation                            options and risks were discussed with the patient.                            All questions were answered, and informed consent                            was obtained. Prior Anticoagulants: The patient has                            taken no anticoagulant or antiplatelet agents. ASA                            Grade Assessment: III - A patient with severe                            systemic disease. After reviewing the risks and  benefits, the patient was deemed in satisfactory                            condition to undergo the procedure.                           After obtaining informed consent, the endoscope was                            passed under direct vision. Throughout the                            procedure, the patient's blood pressure, pulse, and                            oxygen  saturations were monitored continuously. The                            [id number]-[id number] ([id number]) Olympus endoscope was introduced                            through the mouth, and advanced to the second part                            of duodenum. The upper GI endoscopy was                            accomplished without difficulty. The patient                            tolerated the procedure well. Scope In: Scope Out: Findings:      LA Grade A (one or more mucosal breaks less than 5 mm, not extending       between tops of 2 mucosal folds) esophagitis with no bleeding was found       in the distal esophagus.      The exam was otherwise without abnormality.      The cardia and gastric fundus were normal on retroflexion. Impression:               - LA Grade A esophagitis with no bleeding. Very                            faint distal esophageal erosion - ? source of                            hematemesis                           - The examination was otherwise normal.                           - No specimens collected. Moderate Sedation:      Not Applicable - Patient had care per Anesthesia. Recommendation:           - Patient has a contact number available for  emergencies. The signs and symptoms of potential                            delayed complications were discussed with the                            patient. Return to normal activities tomorrow.                            Written discharge instructions were provided to the                            patient.                           - Return to floor                           Possible dc today                           Low fat diet                           Pepcid  40- mg bid                           Keep f/u appt Dr. Nandigam 11/20 Procedure Code(s):        --- Professional ---                           [phone], Esophagogastroduodenoscopy, flexible,                            transoral; diagnostic,  including collection of                            specimen(s) by brushing or washing, when performed                            (separate procedure) Diagnosis Code(s):        --- Professional ---                           K92.0, Hematemesis CPT copyright 2022 [employer]. All rights reserved. The codes documented in this report are preliminary and upon coder review may  be revised to meet current compliance requirements. Lupita FORBES Commander, MD 01/28/2024 4:02:10 PM This report has been signed electronically. Number of Addenda: 0

## 2024-01-29 ENCOUNTER — Encounter (HOSPITAL_COMMUNITY): Payer: Self-pay | Admitting: Internal Medicine

## 2024-01-29 DIAGNOSIS — D696 Thrombocytopenia, unspecified: Secondary | ICD-10-CM

## 2024-01-29 DIAGNOSIS — K861 Other chronic pancreatitis: Secondary | ICD-10-CM | POA: Diagnosis not present

## 2024-01-29 DIAGNOSIS — E441 Mild protein-calorie malnutrition: Secondary | ICD-10-CM | POA: Diagnosis not present

## 2024-01-29 DIAGNOSIS — K922 Gastrointestinal hemorrhage, unspecified: Secondary | ICD-10-CM

## 2024-01-29 DIAGNOSIS — K920 Hematemesis: Secondary | ICD-10-CM | POA: Diagnosis not present

## 2024-01-29 DIAGNOSIS — I10 Essential (primary) hypertension: Secondary | ICD-10-CM | POA: Diagnosis not present

## 2024-01-29 LAB — CBC
HCT: 35.7 % — ABNORMAL LOW (ref 39.0–52.0)
Hemoglobin: 11.7 g/dL — ABNORMAL LOW (ref 13.0–17.0)
MCH: 34.1 pg — ABNORMAL HIGH (ref 26.0–34.0)
MCHC: 32.8 g/dL (ref 30.0–36.0)
MCV: 104.1 fL — ABNORMAL HIGH (ref 80.0–100.0)
Platelets: 136 K/uL — ABNORMAL LOW (ref 150–400)
RBC: 3.43 MIL/uL — ABNORMAL LOW (ref 4.22–5.81)
RDW: 13.7 % (ref 11.5–15.5)
WBC: 5.9 K/uL (ref 4.0–10.5)
nRBC: 0 % (ref 0.0–0.2)

## 2024-01-29 LAB — COMPREHENSIVE METABOLIC PANEL WITH GFR
ALT: 20 U/L (ref 0–44)
AST: 21 U/L (ref 15–41)
Albumin: 3.7 g/dL (ref 3.5–5.0)
Alkaline Phosphatase: 49 U/L (ref 38–126)
Anion gap: 9 (ref 5–15)
BUN: 6 mg/dL (ref 6–20)
CO2: 27 mmol/L (ref 22–32)
Calcium: 9.3 mg/dL (ref 8.9–10.3)
Chloride: 101 mmol/L (ref 98–111)
Creatinine, Ser: 0.96 mg/dL (ref 0.61–1.24)
GFR, Estimated: >60 mL/min (ref >60)
Glucose, Bld: 128 mg/dL — ABNORMAL HIGH (ref 70–99)
Potassium: 4 mmol/L (ref 3.5–5.1)
Sodium: 137 mmol/L (ref 135–145)
Total Bilirubin: 0.6 mg/dL (ref 0.0–1.2)
Total Protein: 6.1 g/dL — ABNORMAL LOW (ref 6.5–8.1)

## 2024-01-29 LAB — MAGNESIUM: Magnesium: 1.9 mg/dL (ref 1.7–2.4)

## 2024-01-29 LAB — GLUCOSE, CAPILLARY
Glucose-Capillary: 118 mg/dL — ABNORMAL HIGH (ref 70–99)
Glucose-Capillary: 121 mg/dL — ABNORMAL HIGH (ref 70–99)
Glucose-Capillary: 159 mg/dL — ABNORMAL HIGH (ref 70–99)
Glucose-Capillary: 164 mg/dL — ABNORMAL HIGH (ref 70–99)

## 2024-01-29 LAB — PHOSPHORUS: Phosphorus: 4 mg/dL (ref 2.5–4.6)

## 2024-01-29 MED ORDER — BLOOD GLUCOSE MONITORING SUPPL DEVI: 1.0000 | Freq: Three times a day (TID) | 0 refills | Status: AC

## 2024-01-29 MED ORDER — BLOOD GLUCOSE TEST VI STRP: 1.0000 | ORAL_STRIP | Freq: Three times a day (TID) | 0 refills | Status: AC

## 2024-01-29 MED ORDER — LANCETS MISC. MISC: 1.0000 | Freq: Three times a day (TID) | 0 refills | Status: AC

## 2024-01-29 MED ORDER — OXYCODONE HCL 5 MG PO TABS
5.0000 mg | ORAL_TABLET | Freq: Four times a day (QID) | ORAL | Status: DC | PRN
  Administered 2024-01-29: 5 mg via ORAL
  Filled 2024-01-29: qty 1

## 2024-01-29 MED ORDER — METFORMIN HCL 500 MG PO TABS: 500.0000 mg | ORAL_TABLET | Freq: Two times a day (BID) | ORAL | 0 refills | Status: DC

## 2024-01-29 MED ORDER — LIVING WELL WITH DIABETES BOOK
Freq: Once | Status: AC
  Filled 2024-01-29: qty 1

## 2024-01-29 MED ORDER — FAMOTIDINE 40 MG PO TABS: 40.0000 mg | ORAL_TABLET | Freq: Two times a day (BID) | ORAL | 0 refills | Status: AC

## 2024-01-29 MED ORDER — INSULIN STARTER KIT- PEN NEEDLES (ENGLISH)
1.0000 | Freq: Once | Status: AC
  Administered 2024-01-29: 1
  Filled 2024-01-29: qty 1

## 2024-01-29 MED ORDER — LANCET DEVICE MISC: 1.0000 | Freq: Three times a day (TID) | 0 refills | Status: AC

## 2024-01-29 MED ORDER — OXYCODONE HCL 5 MG PO TABS: 5.0000 mg | ORAL_TABLET | Freq: Four times a day (QID) | ORAL | 0 refills | Status: AC | PRN

## 2024-01-29 MED ORDER — ONDANSETRON 4 MG PO TBDP: 4.0000 mg | ORAL_TABLET | Freq: Three times a day (TID) | ORAL | 0 refills | Status: AC | PRN

## 2024-01-29 NOTE — Progress Notes (Signed)
 Discharge instructions given to patient questions asked and answered.

## 2024-01-29 NOTE — Anesthesia Postprocedure Evaluation (Signed)
 Anesthesia Post Note  Patient: Kyle Berry  Procedure(s) Performed: EGD (ESOPHAGOGASTRODUODENOSCOPY)     Patient location during evaluation: Endoscopy Anesthesia Type: MAC Level of consciousness: awake and alert Pain management: pain level controlled Vital Signs Assessment: post-procedure vital signs reviewed and stable Respiratory status: spontaneous breathing, nonlabored ventilation, respiratory function stable and patient connected to nasal cannula oxygen Cardiovascular status: stable and blood pressure returned to baseline Postop Assessment: no apparent nausea or vomiting Anesthetic complications: no   No notable events documented.  Last Vitals:  Vitals:   01/29/24 0806 01/29/24 1202  BP: (!) 161/95 (!) 135/102  Pulse: 68 71  Resp:  16  Temp:  36.9 C  SpO2:  98%    Last Pain:  Vitals:   01/29/24 1235  TempSrc:   PainSc: 6    Pain Goal:                   Rome Ade

## 2024-01-29 NOTE — Discharge Summary (Signed)
 Physician Discharge Summary   Patient: Kyle Berry MRN: [id number] DOB: 03-Jul-1970  Admit date:     01/27/2024  Discharge date: 01/29/24  Discharge Physician: Alejandro Marker, DO   PCP: [facility]   Recommendations at discharge:   Follow-up with PCP within 1 to 2 weeks repeat CBC, CMP, mag, Phos within 1 week; patient has an appointment with his PCP on 01/30/2024 at 8:30 AM Follow-up with Gastroenterology Dr. Shila on 03/05/2024 Follow-up with PCP to obtain C-peptide, GAD antibody testing  Discharge Diagnoses: Principal Problem:   Hematemesis/vomiting blood Active Problems:   Chronic pancreatitis (HCC)   Essential hypertension   Mild protein-calorie malnutrition   Thrombocytopenia   Acute upper GI bleed  Resolved Problems:   * No resolved hospital problems. *  Hospital Course: Kyle Berry is an 53 y.o. male past medical history of essential hypertension, chronic alcoholic pancreatitis, hepatic steatosis status post ERCP with pancreatic duct stenting in 2022, multiple CT-guided celiac plexus block last 1 on 12/30/2022 with a celiac plexus neurolysis on 01/15/2024 comes in for abdominal pain and hematemesis that started 5 days prior to admission.  He was admitted and underwent EGD on 01/28/2024 which showed LA grade a esophagitis with no bleeding and very faint distal esophageal ulcer which was possibly a source of his hematemesis.  The examination was otherwise normal and he was changed to famotidine  40 mg twice daily.  GI felt that since he was hemodynamically stable he was okay for discharge and he will follow-up with Dr. Shila on 03/05/24.   Assessment and Plan:  Upper GI bleed acute blood loss anemia/ Hematemesis/vomiting blood: Improved Last hemoglobin 12/27/2023 was 14; Hgb/Hct Trend:  Recent Labs  Lab 01/15/24 0930 01/27/24 0957 01/27/24 2049 01/28/24 0440 01/29/24 0653  HGB 12.8* 12.8* 12.0* 11.7* 11.7*  HCT 37.8* 38.1* 36.0*  35.6* 35.7*  MCV 101.3* 101.3* 102.9* 103.8* 104.1*  -Started on IV fluids type and cross him 2 units of packed red blood cells. -Was placed n.p.o. GI was consulted recommended an EGD for 01/28/2024.  EGD done and showed LA grade esophagitis without evidence of bleeding and very faint distal esophageal erosion which was possibly a source of his hematemesis.  His famotidine  was increased to 40 mg twice daily.No further episodes of bleeding this morning.  GI felt that he could be safely discharged and will need to follow-up outpatient setting with Dr. Shila on 03/05/24.    Hyperglycemia/newly diagnosed diabetes mellitus type 2: Likely reactive, A1c of 9.7. Will start him on sliding scale insulin .  CBGs every 4 he is currently NPO.  Diabetes education coordinator evaluated and will be sending him home on metformin with further titration of blood sugar medications per his PCP.  Will also be sending home with a glucometer   Hypovolemic hyponatremia: Resolved with IV fluids. Na+ Trend:  Recent Labs  Lab 01/15/24 0930 01/27/24 0957 01/28/24 0440 01/29/24 0653  NA 138 133* 136 137    Chronic pancreatitis: Continue with hydrocodone  as acetaminophen  and Creon  3 times daily with meals   Essential hypertension: All antihypertensive medications were held on admission he was started on IV fluids his blood pressure is borderline elevated. Can restart post EGD.   Mild Protein caloric malnutrition: Noted.   Thrombocytopenia: Plt Count Trend:  Recent Labs  Lab 01/15/24 0930 01/27/24 0957 01/27/24 2049 01/28/24 0440 01/29/24 0653  PLT 224 143* 139* 131* 136*  -CTM and Trend and repeat CMP within 1 week.  His CT  abdomen pelvis showed hepatic steatosis without cirrhosis and no splenomegaly  Consultants: Gastroenterology Procedures performed: EGD   Disposition: Home  Diet recommendation:  Cardiac and Carb modified diet  DISCHARGE MEDICATION: Allergies as of 01/29/2024       Reactions    Norvasc  [amlodipine  Besylate] Other (See Comments)   Fluid buildup in chest   Voltaren  [diclofenac ] Hives, Other (See Comments)   Fluid buildup in chest   Prilosec [omeprazole] Other (See Comments)   MD stopped due to pancreatitis   Hydrochlorothiazide Itching   Ultram  [tramadol ] Other (See Comments)   Stomach upset   Prednisone  Hives, Other (See Comments)   Mood swings        Medication List     TAKE these medications    albuterol  108 (90 Base) MCG/ACT inhaler Commonly known as: VENTOLIN  HFA Inhale 2 puffs into the lungs every 6 (six) hours as needed for wheezing or shortness of breath.   Blood Glucose Monitoring Suppl Devi 1 each by Does not apply route in the morning, at noon, and at bedtime. May substitute to any manufacturer covered by patient's insurance.   BLOOD GLUCOSE TEST STRIPS Strp 1 each by Does not apply route in the morning, at noon, and at bedtime. May substitute to any manufacturer covered by patient's insurance.   carvedilol  3.125 MG tablet Commonly known as: COREG  Take 1 tablet (3.125 mg total) by mouth 2 (two) times daily with a meal.   Ensure Original Liqd Take 1 Container by mouth 2 (two) times daily.   famotidine  40 MG tablet Commonly known as: PEPCID  Take 1 tablet (40 mg total) by mouth 2 (two) times daily. What changed:  medication strength how much to take   Lancet Device Misc 1 each by Does not apply route in the morning, at noon, and at bedtime. May substitute to any manufacturer covered by patient's insurance.   Lancets Misc. Misc 1 each by Does not apply route in the morning, at noon, and at bedtime. May substitute to any manufacturer covered by patient's insurance.   lipase/protease/amylase 63999 UNITS Cpep capsule Commonly known as: CREON  Take 2 capsules with each meal and 1 capsule with each snack (2 snacks/day)   metFORMIN 500 MG tablet Commonly known as: GLUCOPHAGE Take 1 tablet (500 mg total) by mouth 2 (two) times daily with  a meal.   naloxone  4 MG/0.1ML Liqd nasal spray kit Commonly known as: NARCAN  Place 1 spray into the nose daily as needed (opioid reversal).   ondansetron  4 MG disintegrating tablet Commonly known as: ZOFRAN -ODT Take 1 tablet (4 mg total) by mouth every 8 (eight) hours as needed for nausea or vomiting.   oxyCODONE  5 MG immediate release tablet Commonly known as: Oxy IR/ROXICODONE  Take 1 tablet (5 mg total) by mouth every 6 (six) hours as needed for moderate pain (pain score 4-6).   promethazine  25 MG tablet Commonly known as: PHENERGAN  Take 1 tablet (25 mg total) by mouth every 6 (six) hours as needed for up to 5 days for nausea or vomiting.   VISINE OP Place 1 drop into both eyes daily.       Discharge Exam: Filed Weights   01/27/24 0925  Weight: 68 kg   Vitals:   01/29/24 0806 01/29/24 1202  BP: (!) 161/95 (!) 135/102  Pulse: 68 71  Resp:  16  Temp:  98.5 F (36.9 C)  SpO2:  98%   Examination: Physical Exam:  Constitutional: Thin African-American male in no acute distress Respiratory: Diminished to  auscultation bilaterally, no wheezing, rales, rhonchi or crackles. Normal respiratory effort and patient is not tachypenic. No accessory muscle use.  Unlabored breathing Cardiovascular: RRR, no murmurs / rubs / gallops. S1 and S2 auscultated. No extremity edema.  Abdomen: Soft, mildly-tender, non-distended. Bowel sounds positive.  GU: Deferred. Musculoskeletal: No clubbing / cyanosis of digits/nails. No joint deformity upper and lower extremities.  Skin: No rashes, lesions, ulcers on a limited skin evaluation. No induration; Warm and dry.  Neurologic: CN 2-12 grossly intact with no focal deficits. Romberg sign and cerebellar reflexes not assessed.  Psychiatric: Normal judgment and insight. Alert and oriented x 3. Normal mood and appropriate affect.   Condition at discharge: stable  The results of significant diagnostics from this hospitalization (including imaging,  microbiology, ancillary and laboratory) are listed below for reference.   Imaging Studies: CT ABDOMEN PELVIS W CONTRAST Result Date: 01/27/2024 CLINICAL DATA:  Generalized abdominal pain EXAM: CT ABDOMEN AND PELVIS WITH CONTRAST TECHNIQUE: Multidetector CT imaging of the abdomen and pelvis was performed using the standard protocol following bolus administration of intravenous contrast. RADIATION DOSE REDUCTION: This exam was performed according to the departmental dose-optimization program which includes automated exposure control, adjustment of the mA and/or kV according to patient size and/or use of iterative reconstruction technique. CONTRAST:  OMNIPAQUE  IOHEXOL  300 MG/ML  SOLN COMPARISON:  November 22, 2023 FINDINGS: Lower chest: No acute abnormality. Hepatobiliary: There is diffuse fatty infiltration of the liver parenchyma. No focal liver abnormality is seen. Status post cholecystectomy. No biliary dilatation. Pancreas: Mild diffuse pancreatic atrophy is noted. No pancreatic ductal dilatation or surrounding inflammatory changes. Spleen: Normal in size without focal abnormality. Adrenals/Urinary Tract: Adrenal glands are unremarkable. The right kidney is atrophic. There is mild compensatory hypertrophy of the left kidney, without renal calculi, focal lesion, or hydronephrosis. Bladder is unremarkable. Stomach/Bowel: Stomach is within normal limits. Appendix appears normal. No evidence of bowel wall thickening, distention, or inflammatory changes. Noninflamed diverticula are seen throughout the large bowel. Vascular/Lymphatic: No significant vascular findings are present. No enlarged abdominal or pelvic lymph nodes. Reproductive: The prostate gland is mildly enlarged. Other: No abdominal wall hernia or abnormality. No abdominopelvic ascites. Musculoskeletal: Degenerative changes are seen at the level of L5-S1. IMPRESSION: 1. Hepatic steatosis. 2. Colonic diverticulosis. 3. Atrophic right kidney. 4.  Mildly enlarged prostate gland. Electronically Signed   By: Suzen Dials M.D.   On: 01/27/2024 11:44   CT CELIAC PLEXUS BLOCK NEUROLYTIC Result Date: 01/15/2024 INDICATION: abdominal pain Briefly, 53 year old male with a history of chronic pancreatitis and severe intractable abdominal pain. Prior endoscopic guided (10/20/2022) and LEFT unilateral CT-guided celiac plexus blockade (11/28/2023). Repeat CT-guided BILATERAL celiac plexus blockade (12/30/2023) with optimal results. Patient presents for neurolysis EXAM: CT-GUIDED CELIAC PLEXUS NEUROLYSIS COMPARISON:  IR CT, 12/30/2023.  CT AP, 11/22/2023 MEDICATIONS: 80 mg Kenalog  and 30 mL 0.5% bupivacaine . ANESTHESIA/SEDATION: Moderate (conscious) sedation was employed during this procedure. A total of Versed  mg and Fentanyl  mcg was administered intravenously. Moderate Sedation Time: minutes. The patient's level of consciousness and vital signs were monitored continuously by radiology nursing throughout the procedure under my direct supervision. CONTRAST:  None. FLUOROSCOPY TIME:  CT dose in mGy was not provided. COMPLICATIONS: None immediate. PROCEDURE: RADIATION DOSE REDUCTION: This exam was performed according to the departmental dose-optimization program which includes automated exposure control, adjustment of the mA and/or kV according to patient size and/or use of iterative reconstruction technique. Informed consent was obtained from the patient following an explanation of the procedure, risks, benefits and  alternatives. A time out was performed prior to the initiation of the procedure. The patient was positioned prone on the CT table and a limited CT was performed for procedural planning demonstrating an adequate window at the upper abdomen. The procedure was planned. A timeout was performed prior to the initiation of the procedure. The operative site was prepped and draped in the usual sterile fashion. Appropriate trajectory was confirmed with a 22 gauge  spinal needle after the adjacent tissues were anesthetized with 1% Lidocaine  with epinephrine. Under intermittent CT guidance, 15 cm 21-gauge Chiba needles were inserted via a posterior approach and the needle tips were positioned in the retroperitoneal space immediately anterolateral to the aorta, at the region of the celiac trunk. A small amount of dilute contrast was injected through the needles to confirm position. A solution containing 10 mL of 6% phenol and 15 mL of Bupivacaine  was mixed and injected through each needle. Intermittent CT scanning confirmed appropriate spread into the extra vascular spaces. The needles were removed and hemostasis was achieved with manual compression. A limited postprocedural CT was negative for hemorrhage or additional complication. A dressing was placed. The patient tolerated the procedure well without immediate postprocedural complication. IMPRESSION: Successful repeat CT-guide diagnostic and therapeutic celiac plexus neurolysis. Thom Hall, MD [facility] Electronically Signed   By: Thom Hall M.D.   On: 01/15/2024 13:49   Microbiology: Results for orders placed or performed in visit on 10/21/23  Microscopic Examination     Status: Abnormal   Collection Time: 10/21/23 12:00 AM   Urine  Result Value Ref Range Status   WBC, UA 0-5 0 - 5 /hpf Final   RBC, Urine 0-2 0 - 2 /hpf Final   Epithelial Cells (non renal) 0-10 0 - 10 /hpf Final   Mucus, UA Present (A) Not Estab. Final   Bacteria, UA Few None seen/Few Final   Labs: CBC: Recent Labs  Lab 01/27/24 0957 01/27/24 2049 01/28/24 0440 01/29/24 0653  WBC 7.4 6.4 6.7 5.9  HGB 12.8* 12.0* 11.7* 11.7*  HCT 38.1* 36.0* 35.6* 35.7*  MCV 101.3* 102.9* 103.8* 104.1*  PLT 143* 139* 131* 136*   Basic Metabolic Panel: Recent Labs  Lab 01/27/24 0957 01/28/24 0440 01/29/24 0653  NA 133* 136 137  K 3.6 3.9 4.0  CL 95* 102 101  CO2 20* 27 27   GLUCOSE 352* 190* 128*  BUN 11 5* 6  CREATININE 1.01 0.77 0.96  CALCIUM 9.2 8.4* 9.3  MG  --   --  1.9  PHOS  --   --  4.0   Liver Function Tests: Recent Labs  Lab 01/27/24 0957 01/28/24 0440 01/29/24 0653  AST 26 22 21   ALT 27 22 20   ALKPHOS 68 55 49  BILITOT 0.6 0.7 0.6  PROT 6.9 5.8* 6.1*  ALBUMIN 4.1 3.6 3.7   CBG: Recent Labs  Lab 01/28/24 1958 01/29/24 0011 01/29/24 0445 01/29/24 0746 01/29/24 1224  GLUCAP 169* 159* 118* 121* 164*   Discharge time spent: greater than 30 minutes.  Signed: Alejandro Marker, DO [facility] 01/31/2024

## 2024-01-29 NOTE — Plan of Care (Signed)
  Problem: Education: Goal: Knowledge of General Education information will improve Description: Including pain rating scale, medication(s)/side effects and non-pharmacologic comfort measures Outcome: Progressing   Problem: Health Behavior/Discharge Planning: Goal: Ability to manage health-related needs will improve Outcome: Progressing   Problem: Clinical Measurements: Goal: Diagnostic test results will improve Outcome: Progressing   Problem: Activity: Goal: Risk for activity intolerance will decrease Outcome: Progressing   Problem: Nutrition: Goal: Adequate nutrition will be maintained Outcome: Progressing   Problem: Elimination: Goal: Will not experience complications related to bowel motility Outcome: Progressing   Problem: Pain Managment: Goal: General experience of comfort will improve and/or be controlled Outcome: Progressing   Problem: Safety: Goal: Ability to remain free from injury will improve Outcome: Progressing   Problem: Nutritional: Goal: Maintenance of adequate nutrition will improve Outcome: Progressing

## 2024-01-29 NOTE — Progress Notes (Addendum)
 [facility] Progress Note  CC:  Hematemesis     Subjective: He endorses having very mild LUQ discomfort, no significant abdominal pain. No nausea. No further hematemesis since admission. Last BM was yesterday, no bloody or black stools. No CP or SOB. He stated he was diagnosed with diabetes during this hospitalization.     Objective:   EGD 01/28/2024: - LA Grade A esophagitis with no bleeding. Very faint distal esophageal erosion - ? source of hematemesis  - The examination was otherwise normal.  - No specimens collected.   Vital signs in last 24 hours: Temp:  [97.2 F (36.2 C)-98.1 F (36.7 C)] 98.1 F (36.7 C) (10/15 0442) Pulse Rate:  [67-79] 68 (10/15 0806) Resp:  [10-18] 18 (10/15 0442) BP: (98-161)/(73-102) 161/95 (10/15 0806) SpO2:  [97 %-100 %] 97 % (10/15 0442) Last BM Date : 01/28/24 General: Alert 53 year old male in NAD.  Heart: RRR, no murmurs.  Pulm: Breath sounds clear throughout. On room air.  Abdomen: Soft, nondistended. Mild LUQ tenderness without rebound or guarding. Positive bowel sounds x 4 quadrants. No palpable mass. No hepatosplenomegaly.  Extremities: No lower extremity edema. Neurologic:  Alert and oriented x 4. Grossly normal neurologically. Psych:  Alert and cooperative. Normal mood and affect.  Intake/Output from previous day: 10/14 0701 - 10/15 0700 In: 103 [I.V.:103] Out: -  Intake/Output this shift: Total I/O In: 240 [P.O.:240] Out: -   Lab Results: Recent Labs    01/27/24 2049 01/28/24 0440 01/29/24 0653  WBC 6.4 6.7 5.9  HGB 12.0* 11.7* 11.7*  HCT 36.0* 35.6* 35.7*  PLT 139* 131* 136*      Patient Profile:  ALPHUS ZECK is a 53 y.o. male with a past medical history of hypertension, chronic lower back pain, kidney stones, migraine headaches, hepatic steatosis, GERD, diverticulosis, colon polyps, recurrent acute pancreatitis and chronic pancreatitis status post ERCP with pancreatic duct stent placement  08/2020. S/P cholecystectomy 2019.   Assessment / Plan:  53 year old male with history of GERD presents with hematemesis x and upper abdominal pain which radiates around to the mid back x 3 days.  No NSAID use.  INR 1.0. EGD 10/14 showed grade A esophagitis without evidence of bleeding and a very faint distal esophageal erosion, possible source of hematemesis. No further hematemesis since admission. Hemodynamically stable. - Continue Famotidine  40mg   bid  - Ok to discharge home from GI perspective  - Patient scheduled for GI follow up with Dr. Shila 03/05/2024   Chronic upper abdominal pain secondary to acute and chronic pancreatitis status post celiac plexus block 11/28/2023, 12/30/2023 and 01/22/2024. Chronic abdominal pain significantly improved following 11/28/2023 celiac plexus block, abdominal pain recurred on 10/11. CTAP showed mild diffuse pancreatic atrophy without pancreatic ductal dilatation or surrounding inflammatory changes. Normal LFTs and lipase level. Epigastric and LUQ pain well controlled. - Pain management per the hospitalist - Continue Creon  tid with meals    Mild macrocytic anemia. Hg 12.8 (Hg 12.0 - 14.4 one month ago) -> Hg 11.7 -> today Hg 11.7. MCV 103.8.  Iron 101.  Ferritin 193.  Vitamin B12 level 230.  - Recommend vitamin B12 supplementation due to low normal B12 level   Bright red blood per the rectum x 3 episodes within the past week without associated lower abdominal or rectal pain.  Likely hemorrhoidal. Patient passed a brown stool this morning without blood.  - Continue to monitor the patient for GI bleeding - Eventual colonoscopy as an outpatient. To  be further discussed at time of follow up appointment with Dr. Shila 03/05/2024   Thrombocytopenia, etiology unknown. PLT 131. CTAP showed hepatic steatosis without cirrhosis and no splenomegaly.    History of colon polyps.  Colonoscopy 10/30/2019 identified four polyps, two large polyps measuring 20 and 30 mm,  all removed from the colon. Path report consistent with tubular adenomas.  He developed a post polypectomy GI bleed and underwent a repeat colonoscopy 7/19/202 , 2 clips were placed at the large cecal and rectosigmoid polypectomy sites. - Eventual colonoscopy as an outpatient as noted above    DM type II, diagnosed during this hospitalization. HgA1c  9.7%. On insulin .      LOS: 2 days   Elida CHRISTELLA Shawl  01/29/2024, 9:54 AM     [facility] Attending   I have taken an interval history, reviewed the chart and examined the patient.  Regarding the medical decision making was made by me.  I agree with the Advanced Practitioner's note, impression and recommendations with the following additions:  He is improved there is no further evidence of bleeding and is being discharged and will be treated with insulin  given his new diagnosis of diabetes.  He has follow-up with Dr. Shila March 05, 2024.  Lupita CHARLENA Commander, MD, [facility]  Gastroenterology See [facility] on call - gastroenterology for best contact person 01/29/2024 3:58 PM

## 2024-01-29 NOTE — Inpatient Diabetes Management (Signed)
 Inpatient Diabetes Program Recommendations  AACE/ADA: New Consensus Statement on Inpatient Glycemic Control (2015)  Target Ranges:  Prepandial:   less than 140 mg/dL      Peak postprandial:   less than 180 mg/dL (1-2 hours)      Critically ill patients:  140 - 180 mg/dL   Lab Results  Component Value Date   GLUCAP 121 (H) 01/29/2024   HGBA1C 9.7 (H) 01/27/2024    Review of Glycemic Control  Diabetes history: New-onset DM Outpatient Diabetes medications: None Current orders for Inpatient glycemic control: Novolog  0-15 Q4H  HgbA1C - 9.7% - may not be accurate with low H/H CBGs 118, 128, 121 today Blood sugar 352 on admission PCP - [facility]  Ordered Living Well with Diabetes book and Insulin  pen starter kit  Received 7 units of Novolog  yesterday  Inpatient Diabetes Program Recommendations:    Agree with orders.  May benefit from getting c-peptide, GAD antibody testing at PCP - does not present like typical Type 2 DM.   Will need glucose meter at discharge.  Pt states he has given his wife (over 12 years ago) insulin  using both syringe and pen. Reviewed insulin  pen administration at bedside. Pt states he eats pretty healthy most of the time. Leaves off sugary beverages, has juice occasionally. Discussed healthy diet with high fiber foods. Discussed A1C results (9.7%) and explained that current A1C indicates an average glucose of 232 mg/dl over the past 2-3 months. Discussed glucose and A1C goals. Discussed importance of checking CBGs and maintaining good CBG control to prevent long-term and short-term complications. Explained how hyperglycemia leads to damage within blood vessels which lead to the common complications seen with uncontrolled diabetes. Stressed to the patient the importance of improving glycemic control to prevent further complications from uncontrolled diabetes. Discussed impact of nutrition, exercise, stress, sickness, and medications on  diabetes control.  Discussed carbohydrates and portion control. Answered all questions/concerns.  Consider metformin 500 mg BID.  F/U with PCP if CBGs > 200 mg/dL    Thank you. Shona Brandy, RD, LDN, CDC[facility] Inpatient Diabetes Coordinator [phone]

## 2024-01-31 NOTE — Hospital Course (Addendum)
 Kyle Berry is an 53 y.o. male past medical history of essential hypertension, chronic alcoholic pancreatitis, hepatic steatosis status post ERCP with pancreatic duct stenting in 2022, multiple CT-guided celiac plexus block last 1 on 12/30/2022 with a celiac plexus neurolysis on 01/15/2024 comes in for abdominal pain and hematemesis that started 5 days prior to admission.  He was admitted and underwent EGD on 01/28/2024 which showed LA grade a esophagitis with no bleeding and very faint distal esophageal ulcer which was possibly a source of his hematemesis.  The examination was otherwise normal and he was changed to famotidine  40 mg twice daily.  GI felt that since he was hemodynamically stable he was okay for discharge and he will follow-up with Dr. Shila on 03/05/24.   Assessment and Plan:  Upper GI bleed acute blood loss anemia/ Hematemesis/vomiting blood: Improved Last hemoglobin 12/27/2023 was 14; Hgb/Hct Trend:  Recent Labs  Lab 01/15/24 0930 01/27/24 0957 01/27/24 2049 01/28/24 0440 01/29/24 0653  HGB 12.8* 12.8* 12.0* 11.7* 11.7*  HCT 37.8* 38.1* 36.0* 35.6* 35.7*  MCV 101.3* 101.3* 102.9* 103.8* 104.1*  -Started on IV fluids type and cross him 2 units of packed red blood cells. -Was placed n.p.o. GI was consulted recommended an EGD for 01/28/2024.  EGD done and showed LA grade esophagitis without evidence of bleeding and very faint distal esophageal erosion which was possibly a source of his hematemesis.  His famotidine  was increased to 40 mg twice daily.No further episodes of bleeding this morning.  GI felt that he could be safely discharged and will need to follow-up outpatient setting with Dr. Shila on 03/05/24.    Hyperglycemia/newly diagnosed diabetes mellitus type 2: Likely reactive, A1c of 9.7. Will start him on sliding scale insulin .  CBGs every 4 he is currently NPO.  Diabetes education coordinator evaluated and will be sending him home on metformin with further  titration of blood sugar medications per his PCP.  Will also be sending home with a glucometer   Hypovolemic hyponatremia: Resolved with IV fluids. Na+ Trend:  Recent Labs  Lab 01/15/24 0930 01/27/24 0957 01/28/24 0440 01/29/24 0653  NA 138 133* 136 137    Chronic pancreatitis: Continue with hydrocodone  as acetaminophen  and Creon  3 times daily with meals   Essential hypertension: All antihypertensive medications were held on admission he was started on IV fluids his blood pressure is borderline elevated. Can restart post EGD.   Mild Protein caloric malnutrition: Noted.   Thrombocytopenia: Plt Count Trend:  Recent Labs  Lab 01/15/24 0930 01/27/24 0957 01/27/24 2049 01/28/24 0440 01/29/24 0653  PLT 224 143* 139* 131* 136*  -CTM and Trend and repeat CMP within 1 week.  His CT abdomen pelvis showed hepatic steatosis without cirrhosis and no splenomegaly

## 2024-03-05 ENCOUNTER — Ambulatory Visit (INDEPENDENT_AMBULATORY_CARE_PROVIDER_SITE_OTHER): Admitting: Gastroenterology

## 2024-03-05 ENCOUNTER — Encounter: Payer: Self-pay | Admitting: Gastroenterology

## 2024-03-05 VITALS — BP 150/80 | HR 80 | Ht 73.0 in | Wt 165.0 lb

## 2024-03-05 DIAGNOSIS — K861 Other chronic pancreatitis: Secondary | ICD-10-CM

## 2024-03-05 DIAGNOSIS — K8689 Other specified diseases of pancreas: Secondary | ICD-10-CM | POA: Diagnosis not present

## 2024-03-05 MED ORDER — PANCRELIPASE (LIP-PROT-AMYL) 36000-114000 UNITS PO CPEP: ORAL_CAPSULE | ORAL | 2 refills | Status: AC

## 2024-03-05 NOTE — Progress Notes (Signed)
 ASHOK SAWAYA    [id number]    July 09, 1970  Primary Care Physician:[facility]  Referring Physician: [facility] [address] [place],  [place] [address]   Chief complaint: Chronic pancreatitis, chronic abdominal pain  Discussed the use of AI scribe software for clinical note transcription with the patient, who gave verbal consent to proceed.  History of Present Illness Kyle Berry is a 53 year old male with chronic pancreatitis who presents for follow-up regarding pain management and diabetes control.  Chronic pancreatitis and abdominal pain - Chronic pancreatitis with pancreatic scarring and atrophy, resulting in impaired enzyme and insulin  production - Persistent abdominal pain previously interfered with daily activities - Celiac plexus nerve blocks have provided significant pain relief, allowing return to work - Completed a series of nerve blocks; additional blocks available if pain recurs  Exocrine pancreatic insufficiency - Insufficient pancreatic enzyme production due to chronic pancreatitis - Creon  therapy for digestive support; one prescription remaining and recent refill obtained  Diabetes mellitus secondary to pancreatic insufficiency - Diabetes attributed to chronic pancreatitis and pancreatic atrophy - Initial metformin  therapy was ineffective - Currently managed with insulin  therapy, resulting in improved glycemic control  Gastroesophageal symptoms - Famotidine  taken at bedtime for esophageal symptom management - No reflux or vomiting    EGD 01/28/24 - LA Grade A esophagitis with no bleeding. Very faint distal esophageal erosion - ? source of hematemesis - The examination was otherwise normal.  01/27/24 1. Hepatic steatosis. 2. Colonic diverticulosis. 3. Atrophic right kidney. 4. Mildly enlarged prostate gland.  EUS 10/20/22 EGD impression: - No gross lesions in the entire  esophagus. Z- line irregular, 43 cm from the incisors. - 1 cm hiatal hernia. - Erythematous mucosa in the stomach. Biopsied. - No gross lesions in the duodenal bulb, in the first portion of the duodenum and in the second portion of the duodenum. - Normal major papilla ( could not truly identify sphincteromy sites) .  EUS impression: - Pancreatic parenchymal abnormalities consisting of hyperechoic foci, lobularity and hyperechoic strands were noted in the pancreatic head, genu of the pancreas, pancreatic body and pancreatic tail. - The pancreatic duct had hyperechoic walls in the pancreatic head, genu of the pancreas, body of the pancreas and tail of the pancreas. No evidence of intraductal pancreatic stones. - There was prominence of the common bile duct and in the common hepatic duct. - There was a suggestion of an inflammatory narrowing/ stricture stricture in the lower third of the main bile duct. His LFTs are completely normal. - No malignant- appearing lymph nodes were visualized in the celiac region ( level 20) , peripancreatic region and porta hepatis region. - Celiac plexus block performed as above.  Outpatient Encounter Medications as of 03/05/2024  Medication Sig   albuterol  (VENTOLIN  HFA) 108 (90 Base) MCG/ACT inhaler Inhale 2 puffs into the lungs every 6 (six) hours as needed for wheezing or shortness of breath.   Blood Glucose Monitoring Suppl DEVI 1 each by Does not apply route in the morning, at noon, and at bedtime. May substitute to any manufacturer covered by patient's insurance.   carvedilol  (COREG ) 3.125 MG tablet Take 1 tablet (3.125 mg total) by mouth 2 (two) times daily with a meal.   famotidine  (PEPCID ) 40 MG tablet Take 1 tablet (40 mg total) by mouth 2 (two) times daily.   lipase/protease/amylase (CREON ) 36000 UNITS CPEP capsule Take 2  capsules with each meal and 1 capsule with each snack (2 snacks/day)   naloxone  (NARCAN ) nasal spray 4 mg/0.1 mL Place 1 spray into the nose daily  as needed (opioid reversal).   Naphazoline-Pheniramine (VISINE OP) Place 1 drop into both eyes daily.   Nutritional Supplements (ENSURE ORIGINAL) LIQD Take 1 Container by mouth 2 (two) times daily.   ondansetron  (ZOFRAN -ODT) 4 MG disintegrating tablet Take 1 tablet (4 mg total) by mouth every 8 (eight) hours as needed for nausea or vomiting.   oxyCODONE  (OXY IR/ROXICODONE ) 5 MG immediate release tablet Take 1 tablet (5 mg total) by mouth every 6 (six) hours as needed for moderate pain (pain score 4-6).   promethazine  (PHENERGAN ) 25 MG tablet Take 1 tablet (25 mg total) by mouth every 6 (six) hours as needed for up to 5 days for nausea or vomiting.   [DISCONTINUED] metFORMIN  (GLUCOPHAGE ) 500 MG tablet Take 1 tablet (500 mg total) by mouth 2 (two) times daily with a meal.   No facility-administered encounter medications on file as of 03/05/2024.    Allergies as of 03/05/2024 - Review Complete 03/05/2024  Allergen Reaction Noted   Norvasc  [amlodipine  besylate] Other (See Comments) 08/15/2016   Voltaren  [diclofenac ] Hives and Other (See Comments) 12/14/2013   Prilosec [omeprazole] Other (See Comments) 08/15/2016   Hydrochlorothiazide Itching 09/19/2021   Ultram  [tramadol ] Other (See Comments) 09/24/2022   Prednisone  Hives and Other (See Comments) 12/14/2013    Past Medical History:  Diagnosis Date   Atrophy of right kidney    Chronic bronchitis (HCC)    Chronic lower back pain    Chronic pancreatitis (HCC)    Gastroparesis 06/2020   confirmed by emptying study at [facility]   GERD (gastroesophageal reflux disease)    Headache    once/month (05/13/2017)   Hematemesis 10/18/2017   Hematochezia 09/19/2021   High cholesterol    Hypertension    Migraine    a couple/year (05/13/2017)   Nephrolithiasis 05/13/2017   Pancreatitis    Pneumonia ~ 09/2015   Presence of pancreatic duct stent    Recurrent acute pancreatitis    Shortness of breath 05/22/2021    Past Surgical History:  Procedure  Laterality Date   BIOPSY  11/02/2020   Procedure: BIOPSY;  Surgeon: Wilhelmenia Aloha Raddle., MD;  Location: [facility] ENDOSCOPY;  Service: Gastroenterology;;   BIOPSY  10/20/2022   Procedure: BIOPSY;  Surgeon: Wilhelmenia Aloha Raddle., MD;  Location: [facility] ENDOSCOPY;  Service: Gastroenterology;;   CHOLECYSTECTOMY N/A 10/23/2017   Procedure: LAPAROSCOPIC CHOLECYSTECTOMY WITH INTRAOPERATIVE CHOLANGIOGRAM;  Surgeon: Gladis Cough, MD;  Location: [facility] ORS;  Service: General;  Laterality: N/A;   COLONOSCOPY WITH PROPOFOL  N/A 11/02/2019   Procedure: COLONOSCOPY WITH PROPOFOL ;  Surgeon: Abran Norleen SAILOR, MD;  Location: [facility] ENDOSCOPY;  Service: Endoscopy;  Laterality: N/A;   ERCP N/A 11/02/2020   Procedure: ENDOSCOPIC RETROGRADE CHOLANGIOPANCREATOGRAPHY (ERCP);  Surgeon: Wilhelmenia Aloha Raddle., MD;  Location: [facility] ENDOSCOPY;  Service: Gastroenterology;  Laterality: N/A;   ESOPHAGOGASTRODUODENOSCOPY N/A 11/02/2020   Procedure: ESOPHAGOGASTRODUODENOSCOPY (EGD);  Surgeon: Wilhelmenia Aloha Raddle., MD;  Location: [facility] ENDOSCOPY;  Service: Gastroenterology;  Laterality: N/A;   ESOPHAGOGASTRODUODENOSCOPY N/A 01/28/2024   Procedure: EGD (ESOPHAGOGASTRODUODENOSCOPY);  Surgeon: Avram Lupita BRAVO, MD;  Location: [facility] ENDOSCOPY;  Service: Gastroenterology;  Laterality: N/A;   ESOPHAGOGASTRODUODENOSCOPY (EGD) WITH PROPOFOL  N/A 11/06/2019   Procedure: ESOPHAGOGASTRODUODENOSCOPY (EGD) WITH PROPOFOL ;  Surgeon: Abran Norleen SAILOR, MD;  Location: [facility] ENDOSCOPY;  Service: Endoscopy;  Laterality: N/A;   ESOPHAGOGASTRODUODENOSCOPY (EGD) WITH PROPOFOL  N/A 10/20/2022   Procedure:  ESOPHAGOGASTRODUODENOSCOPY (EGD) WITH PROPOFOL ;  Surgeon: Mansouraty, Aloha Raddle., MD;  Location: [facility] ENDOSCOPY;  Service: Gastroenterology;  Laterality: N/A;   HEMOSTASIS CLIP PLACEMENT  11/02/2019   Procedure: HEMOSTASIS CLIP PLACEMENT;  Surgeon: Abran Norleen SAILOR, MD;  Location: [facility] ENDOSCOPY;  Service: Endoscopy;;   NEUROLYTIC CELIAC PLEXUS  10/20/2022   Procedure: NEUROLYTIC CELIAC PLEXUS;   Surgeon: Wilhelmenia Aloha Raddle., MD;  Location: [facility] ENDOSCOPY;  Service: Gastroenterology;;   NO PAST SURGERIES     REMOVAL OF STONES  11/02/2020   Procedure: REMOVAL OF STONES;  Surgeon: Wilhelmenia Aloha Raddle., MD;  Location: [facility] ENDOSCOPY;  Service: Gastroenterology;;   CLEDA REMOVAL  11/02/2020   Procedure: STENT REMOVAL;  Surgeon: Wilhelmenia Aloha Raddle., MD;  Location: [facility] ENDOSCOPY;  Service: Gastroenterology;;   UPPER ESOPHAGEAL ENDOSCOPIC ULTRASOUND (EUS) N/A 10/20/2022   Procedure: UPPER ESOPHAGEAL ENDOSCOPIC ULTRASOUND (EUS);  Surgeon: Wilhelmenia Aloha Raddle., MD;  Location: [facility] ENDOSCOPY;  Service: Gastroenterology;  Laterality: N/A;    Family History  Problem Relation Age of Onset   Hypertension Other    Hypertension Mother    Hypertension Father    Kidney disease Father    Hypertension Sister    Diabetes Sister    Hypertension Brother    Pancreatic cancer Paternal Grandmother    Colon cancer Cousin    Stomach cancer Neg Hx    Esophageal cancer Neg Hx     Social History   Socioeconomic History   Marital status: Single    Spouse name: Not on file   Number of children: Not on file   Years of education: Not on file   Highest education level: Not on file  Occupational History   Occupation: horticulturist, commercial  Tobacco Use   Smoking status: Some Days    Types: Cigarettes   Smokeless tobacco: Never  Vaping Use   Vaping status: Never Used  Substance and Sexual Activity   Alcohol  use: Not Currently   Drug use: No   Sexual activity: Yes    Partners: Female    Birth control/protection: Condom  Other Topics Concern   Not on file  Social History Narrative   Not on file   Social Drivers of Health   Financial Resource Strain: Not at Risk (12/03/2022)   Received from [employer]    How hard is it for you to pay for the very basics like food, housing, heating, medical care, and medications?: 1  Food Insecurity: No Food Insecurity (01/27/2024)   Hunger  Vital Sign    Worried About Running Out of Food in the Last Year: Never true    Ran Out of Food in the Last Year: Never true  Transportation Needs: No Transportation Needs (01/27/2024)   PRAPARE - Administrator, Civil Service (Medical): No    Lack of Transportation (Non-Medical): No  Physical Activity: Not on File (08/03/2021)   Received from [facility]   Physical Activity    Physical Activity: 0  Stress: Not on File (08/03/2021)   Received from [facility]   Stress    Stress: 0  Social Connections: Moderately Integrated (01/27/2024)   Social Connection and Isolation Panel    Frequency of Communication with Friends and Family: More than three times a week    Frequency of Social Gatherings with Friends and Family: More than three times a week    Attends Religious Services: More than 4 times per year    Active Member of Clubs or Organizations: No    Attends  Club or Organization Meetings: Never    Marital Status: Living with partner  Intimate Partner Violence: Not At Risk (01/27/2024)   Humiliation, Afraid, Rape, and Kick questionnaire    Fear of Current or Ex-Partner: No    Emotionally Abused: No    Physically Abused: No    Sexually Abused: No      Review of systems: All other review of systems negative except as mentioned in the HPI.   Physical Exam: Vitals:   03/05/24 0904  BP: (!) 150/80  Pulse: 80   Body mass index is 21.77 kg/m. Gen:      No acute distress HEENT:  sclera anicteric CV: s1s2 rrr, no murmur Lungs: B/l clear. Abd:      soft, non-tender; no palpable masses, no distension Ext:    No edema Neuro: alert and oriented x 3 Psych: normal mood and affect  Data Reviewed:  Reviewed labs, radiology imaging, old records and pertinent past GI work up    Assessment & Plan Chronic pancreatitis with exocrine pancreatic insufficiency and secondary diabetes mellitus Chronic pancreatitis with exocrine pancreatic insufficiency leading to secondary diabetes  mellitus. Pancreatic atrophy and scarring result in insufficient enzyme and insulin  production. Insulin  therapy is preferred over metformin  due to ineffectiveness of the latter. Creon  is used to aid digestion and reduce pancreatic stress. - Continue insulin  therapy for diabetes management. - Continue Creon  for pancreatic enzyme replacement. - Provided Creon  prescription refills and samples for snacks.  Chronic abdominal pain due to chronic pancreatitis, status post celiac plexus nerve block Chronic abdominal pain secondary to chronic pancreatitis. Celiac plexus nerve blocks have been effective in managing pain, allowing return to work. Pain is currently manageable with no further nerve blocks planned unless pain recurs. - Will consider repeat celiac plexus nerve block if pain recurs.  Gastroesophageal reflux disease (GERD) GERD is well-controlled with famotidine  at bedtime. No current symptoms of reflux or vomiting reported. - Continue famotidine  at bedtime.        This visit required >30 minutes of patient care (this includes precharting, chart review, review of results, face-to-face time used for counseling as well as treatment plan and follow-up. The patient was provided an opportunity to ask questions and all were answered. The patient agreed with the plan and demonstrated an understanding of the instructions.  LOIS Wilkie Mcgee , MD    CC: [facility]*

## 2024-03-05 NOTE — Patient Instructions (Addendum)
 We have sent the following medications to your pharmacy for you to pick up at your convenience: Creon    Follow-up in 6 months.   VISIT SUMMARY:  You came in for a follow-up visit to manage your chronic pancreatitis, diabetes, and abdominal pain. We discussed your current treatments and made plans to continue your current medications and therapies.  YOUR PLAN:  CHRONIC PANCREATITIS WITH EXOCRINE PANCREATIC INSUFFICIENCY AND SECONDARY DIABETES MELLITUS: Your chronic pancreatitis has led to insufficient enzyme and insulin  production, causing diabetes. You are currently on insulin  therapy and Creon  for enzyme replacement. -Continue insulin  therapy for diabetes management. -Continue taking Creon  for pancreatic enzyme replacement. -We provided you with Creon  prescription refills and samples for snacks.  CHRONIC ABDOMINAL PAIN DUE TO CHRONIC PANCREATITIS: Your chronic abdominal pain is due to chronic pancreatitis. The celiac plexus nerve blocks have been effective in managing your pain. -We will consider repeat celiac plexus nerve blocks if your pain comes back.  GASTROESOPHAGEAL REFLUX DISEASE (GERD): Your GERD is well-controlled with famotidine , and you have no current symptoms of reflux or vomiting. -Continue taking famotidine  at bedtime.  _______________________________________________________  If your blood pressure at your visit was 140/90 or greater, please contact your primary care physician to follow up on this.  _______________________________________________________  If you are age 22 or older, your body mass index should be between 23-30. Your Body mass index is 21.77 kg/m. If this is out of the aforementioned range listed, please consider follow up with your Primary Care Provider.  If you are age 29 or younger, your body mass index should be between 19-25. Your Body mass index is 21.77 kg/m. If this is out of the aformentioned range listed, please consider follow up with your  Primary Care Provider.   ________________________________________________________  The [facility] providers would like to encourage you to use MYCHART to communicate with providers for non-urgent requests or questions.  Due to long hold times on the telephone, sending your provider a message by [facility] may be a faster and more efficient way to get a response.  Please allow 48 business hours for a response.  Please remember that this is for non-urgent requests.  _______________________________________________________  [facility] is using a team-based approach to care.  Your team is made up of your doctor and two to three APPS. Our APPS (Nurse Practitioners and Physician Assistants) work with your physician to ensure care continuity for you. They are fully qualified to address your health concerns and develop a treatment plan. They communicate directly with your gastroenterologist to care for you. Seeing the Advanced Practice Practitioners on your physician's team can help you by facilitating care more promptly, often allowing for earlier appointments, access to diagnostic testing, procedures, and other specialty referrals.   Due to recent changes in healthcare laws, you may see the results of your imaging and laboratory studies on MyChart before your provider has had a chance to review them.  We understand that in some cases there may be results that are confusing or concerning to you. Not all laboratory results come back in the same time frame and the provider may be waiting for multiple results in order to interpret others.  Please give us  48 hours in order for your provider to thoroughly review all the results before contacting the office for clarification of your results.   Thank you for choosing me and [facility].  Alexee Delsanto,PA-C

## 2024-03-10 ENCOUNTER — Encounter: Payer: Self-pay | Admitting: Gastroenterology

## 2024-05-05 MED ORDER — PANCRELIPASE (LIP-PROT-AMYL) 36000-114000 UNITS PO CPEP: ORAL_CAPSULE | ORAL | 2 refills | Status: AC
